# Patient Record
Sex: Female | Born: 1970 | Race: Black or African American | Hispanic: No | Marital: Single | Smoking: Never smoker
Health system: Southern US, Community
[De-identification: ages and names within clinical notes are randomized; demographics above are authoritative.]

## PROBLEM LIST (undated history)

## (undated) DIAGNOSIS — J45909 Unspecified asthma, uncomplicated: Secondary | ICD-10-CM

## (undated) DIAGNOSIS — I209 Angina pectoris, unspecified: Secondary | ICD-10-CM

## (undated) DIAGNOSIS — M069 Rheumatoid arthritis, unspecified: Secondary | ICD-10-CM

## (undated) DIAGNOSIS — D649 Anemia, unspecified: Secondary | ICD-10-CM

## (undated) DIAGNOSIS — C539 Malignant neoplasm of cervix uteri, unspecified: Secondary | ICD-10-CM

## (undated) DIAGNOSIS — G4730 Sleep apnea, unspecified: Secondary | ICD-10-CM

## (undated) DIAGNOSIS — Z95810 Presence of automatic (implantable) cardiac defibrillator: Secondary | ICD-10-CM

## (undated) DIAGNOSIS — D509 Iron deficiency anemia, unspecified: Secondary | ICD-10-CM

## (undated) DIAGNOSIS — G43909 Migraine, unspecified, not intractable, without status migrainosus: Secondary | ICD-10-CM

## (undated) DIAGNOSIS — R87629 Unspecified abnormal cytological findings in specimens from vagina: Secondary | ICD-10-CM

## (undated) DIAGNOSIS — Z9289 Personal history of other medical treatment: Secondary | ICD-10-CM

## (undated) DIAGNOSIS — E119 Type 2 diabetes mellitus without complications: Secondary | ICD-10-CM

## (undated) DIAGNOSIS — M797 Fibromyalgia: Secondary | ICD-10-CM

## (undated) DIAGNOSIS — I509 Heart failure, unspecified: Secondary | ICD-10-CM

## (undated) DIAGNOSIS — I639 Cerebral infarction, unspecified: Secondary | ICD-10-CM

## (undated) DIAGNOSIS — D573 Sickle-cell trait: Secondary | ICD-10-CM

## (undated) DIAGNOSIS — L930 Discoid lupus erythematosus: Secondary | ICD-10-CM

## (undated) DIAGNOSIS — J189 Pneumonia, unspecified organism: Secondary | ICD-10-CM

## (undated) DIAGNOSIS — M329 Systemic lupus erythematosus, unspecified: Secondary | ICD-10-CM

## (undated) DIAGNOSIS — I10 Essential (primary) hypertension: Secondary | ICD-10-CM

## (undated) HISTORY — PX: KNEE SURGERY: SHX244

## (undated) HISTORY — PX: EYE SURGERY: SHX253

## (undated) HISTORY — DX: Type 2 diabetes mellitus without complications: E11.9

## (undated) HISTORY — DX: Sleep apnea, unspecified: G47.30

## (undated) HISTORY — PX: INCISE AND DRAIN ABCESS: PRO64

## (undated) HISTORY — DX: Unspecified abnormal cytological findings in specimens from vagina: R87.629

## (undated) HISTORY — DX: Personal history of other medical treatment: Z92.89

---

## 1989-12-23 HISTORY — PX: DILATION AND CURETTAGE OF UTERUS: SHX78

## 1994-12-23 HISTORY — PX: TUBAL LIGATION: SHX77

## 1995-12-24 HISTORY — PX: KNEE ARTHROSCOPY: SHX127

## 1998-09-02 ENCOUNTER — Emergency Department (HOSPITAL_COMMUNITY): Admission: EM | Admit: 1998-09-02 | Discharge: 1998-09-02 | Payer: Self-pay | Admitting: Emergency Medicine

## 1998-09-13 ENCOUNTER — Ambulatory Visit (HOSPITAL_COMMUNITY): Admission: RE | Admit: 1998-09-13 | Discharge: 1998-09-13 | Payer: Self-pay | Admitting: Internal Medicine

## 1998-09-13 ENCOUNTER — Encounter: Payer: Self-pay | Admitting: Internal Medicine

## 1999-03-25 ENCOUNTER — Emergency Department (HOSPITAL_COMMUNITY): Admission: EM | Admit: 1999-03-25 | Discharge: 1999-03-25 | Payer: Self-pay | Admitting: Emergency Medicine

## 1999-03-25 ENCOUNTER — Encounter: Payer: Self-pay | Admitting: Emergency Medicine

## 1999-03-31 ENCOUNTER — Encounter: Payer: Self-pay | Admitting: Emergency Medicine

## 1999-03-31 ENCOUNTER — Emergency Department (HOSPITAL_COMMUNITY): Admission: EM | Admit: 1999-03-31 | Discharge: 1999-03-31 | Payer: Self-pay | Admitting: Emergency Medicine

## 1999-08-18 ENCOUNTER — Encounter: Payer: Self-pay | Admitting: Emergency Medicine

## 1999-08-18 ENCOUNTER — Emergency Department (HOSPITAL_COMMUNITY): Admission: EM | Admit: 1999-08-18 | Discharge: 1999-08-19 | Payer: Self-pay | Admitting: Emergency Medicine

## 2000-05-06 ENCOUNTER — Ambulatory Visit (HOSPITAL_BASED_OUTPATIENT_CLINIC_OR_DEPARTMENT_OTHER): Admission: RE | Admit: 2000-05-06 | Discharge: 2000-05-06 | Payer: Self-pay | Admitting: Orthopedic Surgery

## 2000-06-26 ENCOUNTER — Encounter: Admission: RE | Admit: 2000-06-26 | Discharge: 2000-06-26 | Payer: Self-pay | Admitting: Family Medicine

## 2000-11-21 ENCOUNTER — Encounter: Payer: Self-pay | Admitting: Internal Medicine

## 2000-11-21 ENCOUNTER — Encounter: Admission: RE | Admit: 2000-11-21 | Discharge: 2000-11-21 | Payer: Self-pay | Admitting: Internal Medicine

## 2000-12-06 ENCOUNTER — Emergency Department (HOSPITAL_COMMUNITY): Admission: EM | Admit: 2000-12-06 | Discharge: 2000-12-06 | Payer: Self-pay | Admitting: Emergency Medicine

## 2000-12-06 ENCOUNTER — Encounter: Payer: Self-pay | Admitting: Emergency Medicine

## 2002-07-11 ENCOUNTER — Emergency Department (HOSPITAL_COMMUNITY): Admission: EM | Admit: 2002-07-11 | Discharge: 2002-07-12 | Payer: Self-pay | Admitting: *Deleted

## 2004-06-11 ENCOUNTER — Encounter: Admission: RE | Admit: 2004-06-11 | Discharge: 2004-06-29 | Payer: Self-pay | Admitting: Internal Medicine

## 2005-01-08 ENCOUNTER — Inpatient Hospital Stay (HOSPITAL_COMMUNITY): Admission: AD | Admit: 2005-01-08 | Discharge: 2005-01-08 | Payer: Self-pay | Admitting: *Deleted

## 2005-01-10 ENCOUNTER — Ambulatory Visit: Payer: Self-pay | Admitting: Hematology & Oncology

## 2005-02-06 ENCOUNTER — Emergency Department (HOSPITAL_COMMUNITY): Admission: EM | Admit: 2005-02-06 | Discharge: 2005-02-06 | Payer: Self-pay | Admitting: Family Medicine

## 2005-02-27 ENCOUNTER — Ambulatory Visit: Payer: Self-pay | Admitting: Hematology & Oncology

## 2005-05-28 ENCOUNTER — Ambulatory Visit: Payer: Self-pay | Admitting: Hematology & Oncology

## 2005-09-03 ENCOUNTER — Ambulatory Visit: Payer: Self-pay | Admitting: Hematology & Oncology

## 2005-10-23 ENCOUNTER — Ambulatory Visit: Payer: Self-pay | Admitting: Hematology & Oncology

## 2005-12-10 ENCOUNTER — Ambulatory Visit: Payer: Self-pay | Admitting: Hematology & Oncology

## 2006-03-04 ENCOUNTER — Ambulatory Visit: Payer: Self-pay | Admitting: Hematology & Oncology

## 2006-06-27 ENCOUNTER — Emergency Department (HOSPITAL_COMMUNITY): Admission: EM | Admit: 2006-06-27 | Discharge: 2006-06-27 | Payer: Self-pay | Admitting: Family Medicine

## 2006-12-17 ENCOUNTER — Encounter: Admission: RE | Admit: 2006-12-17 | Discharge: 2006-12-17 | Payer: Self-pay | Admitting: Rheumatology

## 2007-03-27 ENCOUNTER — Emergency Department (HOSPITAL_COMMUNITY): Admission: EM | Admit: 2007-03-27 | Discharge: 2007-03-27 | Payer: Self-pay | Admitting: Emergency Medicine

## 2007-05-21 ENCOUNTER — Ambulatory Visit (HOSPITAL_COMMUNITY): Admission: RE | Admit: 2007-05-21 | Discharge: 2007-05-21 | Payer: Self-pay | Admitting: Obstetrics & Gynecology

## 2007-05-21 ENCOUNTER — Encounter (INDEPENDENT_AMBULATORY_CARE_PROVIDER_SITE_OTHER): Payer: Self-pay | Admitting: Obstetrics & Gynecology

## 2007-06-03 ENCOUNTER — Ambulatory Visit: Payer: Self-pay | Admitting: Hematology & Oncology

## 2007-06-03 LAB — CBC WITH DIFFERENTIAL/PLATELET
BASO%: 0.1 % (ref 0.0–2.0)
Basophils Absolute: 0 10*3/uL (ref 0.0–0.1)
EOS%: 1.2 % (ref 0.0–7.0)
Eosinophils Absolute: 0.1 10*3/uL (ref 0.0–0.5)
HCT: 24.7 % — ABNORMAL LOW (ref 34.8–46.6)
HGB: 7.8 g/dL — ABNORMAL LOW (ref 11.6–15.9)
LYMPH%: 24.7 % (ref 14.0–48.0)
MCH: 20 pg — ABNORMAL LOW (ref 26.0–34.0)
MCHC: 31.6 g/dL — ABNORMAL LOW (ref 32.0–36.0)
MCV: 63.3 fL — ABNORMAL LOW (ref 81.0–101.0)
MONO#: 0.5 10*3/uL (ref 0.1–0.9)
MONO%: 9.5 % (ref 0.0–13.0)
NEUT#: 3.2 10*3/uL (ref 1.5–6.5)
NEUT%: 64.5 % (ref 39.6–76.8)
Platelets: 195 10*3/uL (ref 145–400)
RBC: 3.91 10*6/uL (ref 3.70–5.32)
RDW: 20.4 % — ABNORMAL HIGH (ref 11.3–14.5)
WBC: 4.9 10*3/uL (ref 3.9–10.0)
lymph#: 1.2 10*3/uL (ref 0.9–3.3)

## 2007-06-10 LAB — TRANSFERRIN RECEPTOR, SOLUABLE: Transferrin Receptor, Soluble: 97.6

## 2007-06-10 LAB — FERRITIN: Ferritin: 3 ng/mL — ABNORMAL LOW (ref 10–291)

## 2007-07-27 ENCOUNTER — Ambulatory Visit: Payer: Self-pay | Admitting: Hematology & Oncology

## 2007-12-24 HISTORY — PX: ABDOMINAL WOUND DEHISCENCE: SHX540

## 2007-12-24 HISTORY — PX: ABDOMINAL HYSTERECTOMY: SHX81

## 2007-12-24 HISTORY — PX: HEMATOMA EVACUATION: SHX5118

## 2008-06-17 ENCOUNTER — Encounter: Admission: RE | Admit: 2008-06-17 | Discharge: 2008-06-17 | Payer: Self-pay | Admitting: Internal Medicine

## 2008-08-09 ENCOUNTER — Ambulatory Visit (HOSPITAL_COMMUNITY): Admission: RE | Admit: 2008-08-09 | Discharge: 2008-08-10 | Payer: Self-pay | Admitting: Obstetrics & Gynecology

## 2008-08-09 ENCOUNTER — Encounter (INDEPENDENT_AMBULATORY_CARE_PROVIDER_SITE_OTHER): Payer: Self-pay | Admitting: Obstetrics & Gynecology

## 2008-08-22 ENCOUNTER — Observation Stay (HOSPITAL_COMMUNITY): Admission: AD | Admit: 2008-08-22 | Discharge: 2008-08-23 | Payer: Self-pay | Admitting: Obstetrics and Gynecology

## 2008-08-25 ENCOUNTER — Inpatient Hospital Stay (HOSPITAL_COMMUNITY): Admission: AD | Admit: 2008-08-25 | Discharge: 2008-08-29 | Payer: Self-pay | Admitting: Obstetrics

## 2008-09-26 ENCOUNTER — Ambulatory Visit (HOSPITAL_COMMUNITY): Admission: RE | Admit: 2008-09-26 | Discharge: 2008-09-26 | Payer: Self-pay | Admitting: Obstetrics & Gynecology

## 2009-01-08 IMAGING — CT CT PELVIS W/ CM
4 of 7 series · 11 of 32 positions shown, 15 images · IV contrast ([ID] READICAT & [ID] OMNIP 300%)
Comparison: 08/25/2008

CLINICAL DATA: Follow up right ovarian vein thrombosis.  Postop
from hysterectomy approximates 6 weeks ago.

CT PELVIS WITH CONTRAST
TECHNIQUE: Multidetector CT imaging of the pelvis was performed
following the standard protocol during administration of
intravenous contrast.
Contrast: 100 ml Imnipaque-U33

[Series 2: abd pelvis · axial · 0.62mm/px · z∈[-466,-196]mm · 3 of 82 slices shown, 7 images]
[im 1/82  soft-tissue]
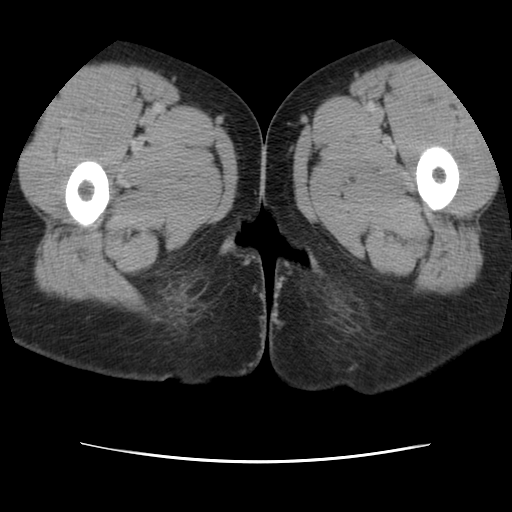
[im 1/82  lung]
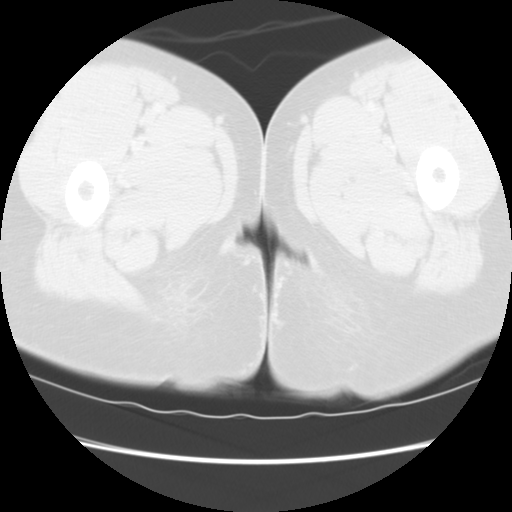
[im 1/82  bone]
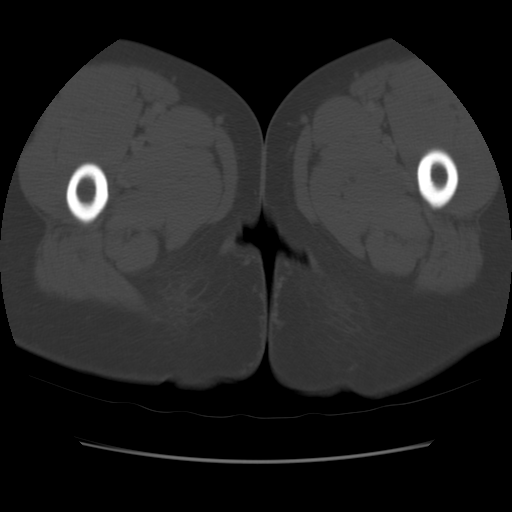
[im 41/82  soft-tissue]
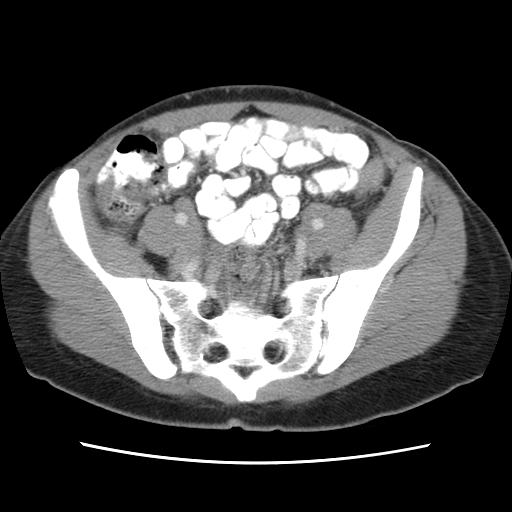
[im 41/82  lung]
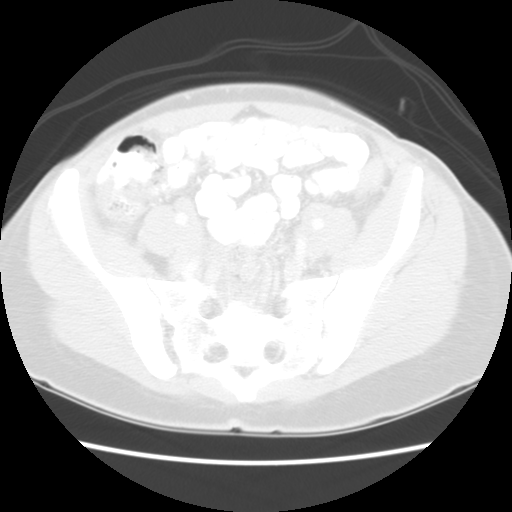
[im 82/82  soft-tissue]
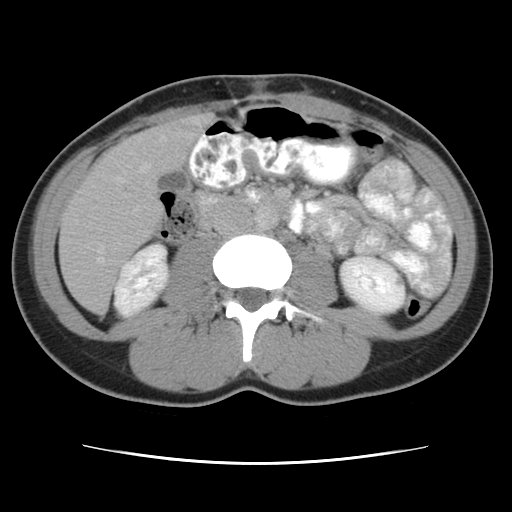
[im 82/82  lung]
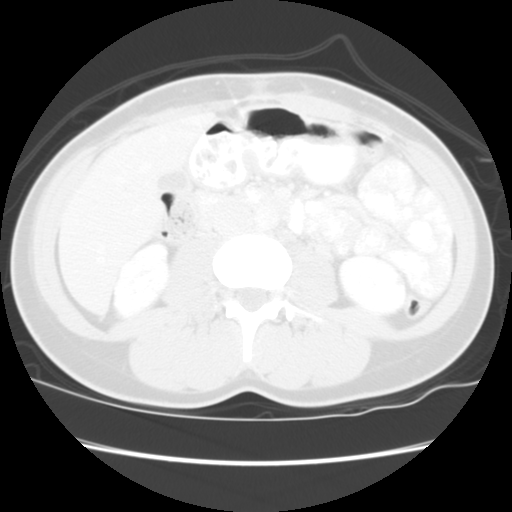

[Series 400: reformatted · coronal · 0.62mm/px · 2 of 110 slices shown (1 of 3)]
[im 37/110  soft-tissue]
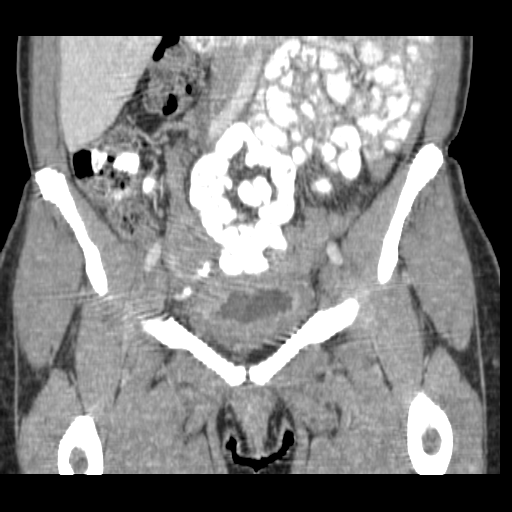
[im 73/110  soft-tissue]
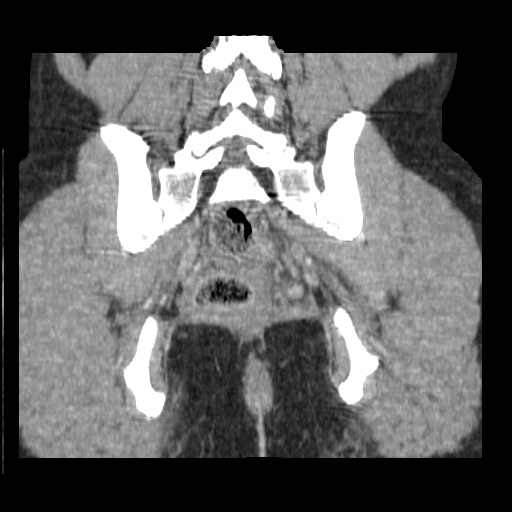

[Series 401: reformatted · sagittal · 0.62mm/px · 3 of 135 slices shown (2 of 3)]
[im 34/135  soft-tissue]
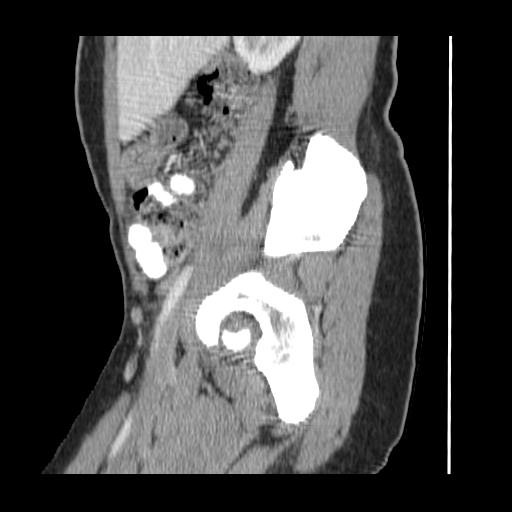
[im 68/135  soft-tissue]
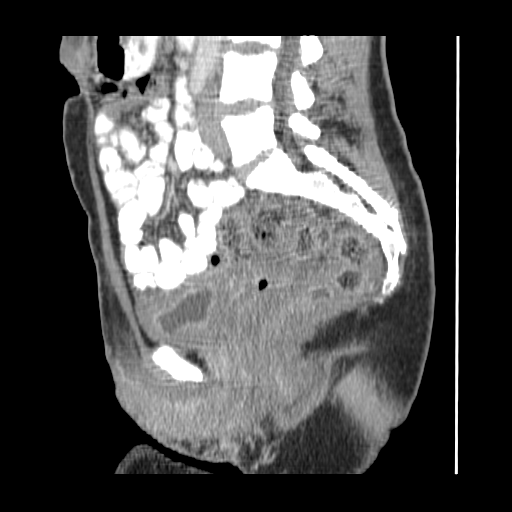
[im 101/135  soft-tissue]
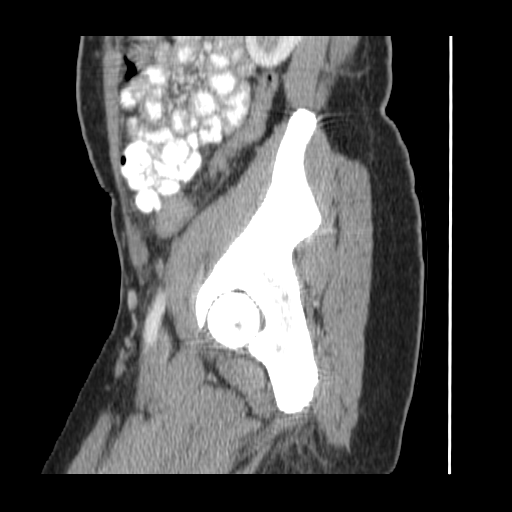

[Series 403: reformatted · sagittal · 0.62mm/px · 3 of 131 slices shown (3 of 3)]
[im 33/131  soft-tissue]
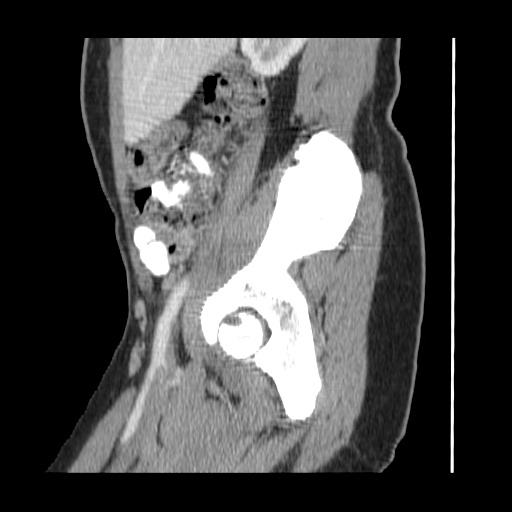
[im 66/131  soft-tissue]
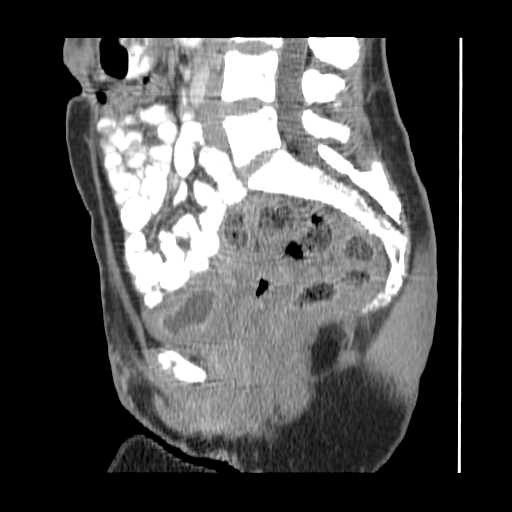
[im 98/131  soft-tissue]
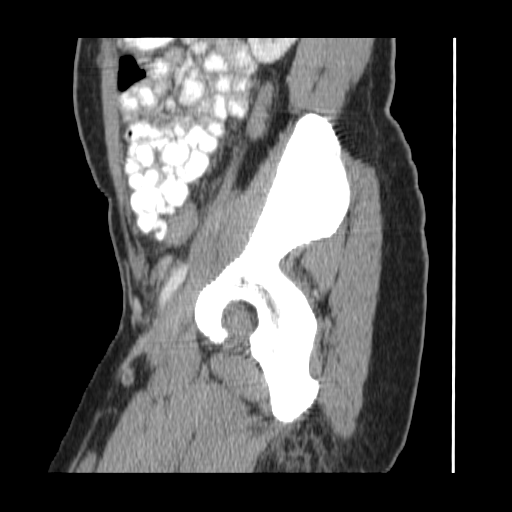

[11 of 32 positions shown; findings below may reference images not displayed]

FINDINGS: Previously seen right ovarian vein thrombosis has now
resolved.  This is no longer seen on today's exam.

The patient has undergone prior hysterectomy.  Previously seen
extraluminal fluid and gas collections in the pelvis have also
resolved.

A new intermediate attenuation lesion is seen in the right ovary on
today's study which measures 4.4 x 5.1 cm.  This appears new since
previous study and likely represents a hemorrhagic ovarian cyst.
The left ovary appears normal in size.  No other mass or
inflammatory process identified.  Pelvic bowel loops are normal in
appearance.
IMPRESSION: 1.  Resolution of right ovarian vein thrombosis since prior exam.
2.  Probable 4 x 5 cm hemorrhagic cyst in the right ovary, which is
new since prior study.  Consider follow-up with pelvic ultrasound
to confirm resolution.

## 2009-02-14 ENCOUNTER — Encounter: Admission: RE | Admit: 2009-02-14 | Discharge: 2009-02-14 | Payer: Self-pay | Admitting: Internal Medicine

## 2010-08-21 ENCOUNTER — Emergency Department (HOSPITAL_COMMUNITY): Admission: EM | Admit: 2010-08-21 | Discharge: 2010-08-21 | Payer: Self-pay | Admitting: Emergency Medicine

## 2010-12-29 ENCOUNTER — Emergency Department (HOSPITAL_COMMUNITY)
Admission: EM | Admit: 2010-12-29 | Discharge: 2010-12-29 | Payer: Self-pay | Source: Home / Self Care | Admitting: Emergency Medicine

## 2011-01-13 ENCOUNTER — Encounter: Payer: Self-pay | Admitting: Obstetrics & Gynecology

## 2011-05-07 NOTE — Op Note (Signed)
NAME:  Christine Mcdowell, Christine Mcdowell  ACCOUNT NO.:  [id number]   MEDICAL RECORD NO.:  [id number]          PATIENT TYPE:  AMB   LOCATION:  SDC                           FACILITY:  [facility]   PHYSICIAN:  Genia Del, M.D.DATE OF BIRTH:  08-Sep-1971   DATE OF PROCEDURE:  05/21/2007  DATE OF DISCHARGE:                               OPERATIVE REPORT   PREOPERATIVE DIAGNOSES:  1. Menorrhagia.  2. Left vulvar cyst.   POSTOPERATIVE DIAGNOSES:  1. Menorrhagia.  2. Left vulvar cyst.   PROCEDURES:  1. Endometrial ablation with NovaSure.  2. Excision of left small labial cyst.   SURGEON:  Genia Del, M.D.   ANESTHESIOLOGIST:  Raul Del, M.D.   PROCEDURE:  Under general anesthesia with endotracheal intubation, the  patient is in lithotomy position.  She is prepped with Betadine on the  suprapubic, vulvar and vaginal areas.  She is draped as usual.  The  vaginal exam under general anesthesia reveals an anteverted uterus,  mobile, no adnexal mass, cervix is long and closed.  No vaginal  bleeding.  We introduce the speculum in the vagina.  We grasp the  anterior lip of the cervix with a tenaculum.  The hysterometry is at 10  cm, the cervical length is 4.5  for a cavity length at 5.5.  We proceed  with dilatation with Hegar dilators up to #33 without difficulty.  We  then introduce the NovaSure instrument in the intrauterine cavity.  We  measure the width at 4.7 cm. the cavity integrity is verified and is  good.  We use a power of 142 fo4 1 minute 28 seconds.  The instrument is  then removed.  Hemostasis is adequate.  The speculum is removed after  removing the tenaculum.  We then verify the vulva.  A small cyst is  present on the superior aspect of the left small labia.  We use the  scalpel to make an incision on the inner side of the labia at that  level.  We rupture the cyst, which drains a white purulent material  which is somewhat viscous.  We then excise the cyst wall and  send it for  pathology.  We then put 2 deep stitches with Vicryl 4-0 to complete  hemostasis at the bed of the cyst.  We then close the skin with  interrupted Vicryl 4-0.  We then use compression to assure good  hemostasis for 3 minutes.  The hemostasis is adequate after that.  The  estimated blood loss was minimal, no complication occurred, and the  patient was brought to recovery room in good, stable status.      Genia Del, M.D.  Electronically Signed    ML/MEDQ  D:  05/21/2007  T:  05/21/2007  Job:  [id number]

## 2011-05-07 NOTE — Op Note (Signed)
NAME:  Christine Mcdowell, Christine Mcdowell          ACCOUNT NO.:  [id number]   MEDICAL RECORD NO.:  [id number]          PATIENT TYPE:  OBV   LOCATION:  9311                          FACILITY:  [facility]   PHYSICIAN:  Genia Del, M.D.DATE OF BIRTH:  12-10-1971   DATE OF PROCEDURE:  08/23/2008  DATE OF DISCHARGE:                               OPERATIVE REPORT   PREOPERATIVE DIAGNOSIS:  Persistent vaginal vault bleeding with  decreasing hemoglobin.   POSTOPERATIVE DIAGNOSES:  Right vaginal vault hematoma/abscess and right  vaginal vault bleeding.   PROCEDURE:  Hemostasis and closure of right vaginal vault, diagnostic  laparoscopy, drainage of right vaginal vault hematoma/abscess.   SURGEON:  Genia Del, MD   ASSISTANT:  None.   ANESTHESIOLOGIST:  Quillian Quince, MD   PROCEDURE:  Under general anesthesia with laryngeal mask, the patient  was in lithotomy position.  She was prepped with Betadine on the  abdominal, suprapubic, vulvar, and vaginal areas.  A Foley was put in  place in the bladder.  The patient was draped as usual.  We go vaginally  first.  The speculum was put in place,  The vaginal vault was inspected.  On the right side, we note bleeding at the vaginal mucosa with partial  dehiscence.  Necrosis is present deep at that location.  We used the  electrocautery to control hemostasis at the vaginal mucosa and then  figure-of-eights were done to close the vaginal mucosa with Vicryl 0.  Hemostasis was adequate at that level.  We therefore changed gloves and  gown and go abdominally.  We infiltrated the subcutaneous tissue with  Marcaine 0.25% plain at the supraumbilical incision.  We opened at the  same location as the first surgery.  The scalpel was used to make the  incision.  We then cut the stitch at the aponeurosis and opened bluntly  with a finger.  We then put a pursestring stitch at the aponeurosis with  Vicryl 0.  We insert the Hasson with a camera at that level.   Pneumoperitoneum was created with CO2.  We inspected the abdominopelvic  cavities.  No evidence of bleeding in the abdominopelvic cavities.  A  minimal amount of clear fluid was present at the vaginal vault.  We note  a bulging area towards the right vaginal vault and right pelvic wall.  This area was palpated with the probe and started draining.  A brownish  material was suctioned and it had a little bit of pus smell.  We  irrigated and suctioned abundantly within the hematoma or beginning of  abscess.  No active bleeding was present at that level.  The vaginal  vault was completely closed.  The CO2 does not evacuate at that level.  We verify the ureters which are peristalsing on both sides.  The bladder  was intact with clear urine.  Fine adhesions were present in the pelvis,  but no other pathology was seen.  We therefore removed all instruments.  We removed the trocars under direct vision after evacuating the CO2 as  much as possible.  We then closed the pursestring stitch at the  supraumbilical incision.  We closed the skin with a subcuticular stitch  of Monocryl 4-0 at that level.  Note that to complete the procedure a 5-  mm trocar was inserted at the site where the left robotic arm was after  infiltrating the subcutaneous tissue with Marcaine 0.25% plain.  At the  end, this incision was also closed with Monocryl 4-0 and Dermabond was  applied on those 2 incisions.  Hemostasis was adequate at those levels.  We then go back vaginally verify hemostasis with a sponge on a stick.  Absolutely no bleeding was present.  The count of instruments and  sponges was complete.  The estimated blood loss was minimal, but about  200 mL of blood clots were evacuated from the vagina at the beginning of  the  intervention.  The patient has received Ancef 1 g IV at induction.  Note  that Flagyl 500 q.8 h. will be given for 7 days because of the  probability of a vaginal vault abscess in development.   The patient was  brought to recovery room in good stable status.      Genia Del, M.D.  Electronically Signed     ML/MEDQ  D:  08/23/2008  T:  08/23/2008  Job:  [id number]

## 2011-05-07 NOTE — Op Note (Signed)
NAME:  Christine Mcdowell, Christine Mcdowell NO.:  [id number]   MEDICAL RECORD NO.:  [id number]          PATIENT TYPE:  AMB   LOCATION:  DAY                          FACILITY:  [facility]   PHYSICIAN:  Genia Del, M.D.DATE OF BIRTH:  1971/05/12   DATE OF PROCEDURE:  08/09/2008  DATE OF DISCHARGE:                               OPERATIVE REPORT   PREOPERATIVE DIAGNOSIS:  Refractory menorrhagia and pelvic pain.   POSTOPERATIVE DIAGNOSIS:  Refractory menorrhagia and pelvic pain.   PROCEDURE:  Total laparoscopy hysterectomy assisted with [employer] robot.   SURGEON:  Dr. Genia Del.   ASSISTANT:  Dr. Leda Quail.   PROCEDURE:  Under general anesthesia with endotracheal intubation, the  patient is in the lithotomy position.  She is prepped with Betadine on  the abdominal, suprapubic, vulvar and vaginal areas and draped as usual.  A Foley catheter is put in place in the bladder.  We proceeded with a  vaginal exam under general anesthesia revealing an anteverted uterus  about 9 to 10 cm in diameter.  No adnexal mass.  The patient has had  prior endometrial ablation that makes the insertion of the [facility] ring more  difficult with the RUMI, but it is successfully put in place as usual.  We then went to abdominal time.  We made a supraumbilical incision with  the scalpel over 1.5 cm after an infiltration of the Marcaine one-  quarter plain.  We opened the aponeurosis with Mayo scissors under  direct vision and the parietal peritoneum bluntly with a finger.  A  pursestring stitch of Vicryl zero is put at the aponeurosis.  We  inserted the [facility] and the laparoscope under direct vision.  We created  a pneumoperitoneum with CO2.  We then put the other trocar placement  organized in a circular configuration.  We have a right robotic port, a  left robotic port and a third arm on the left; we have the assistant  port on the right.  Infiltration of the subcutaneous tissue with  Marcaine is  done each time the scalpel is used for the incisions and the  trocars are inserted under direct vision.  We then docked the robot on  the left side of the patient without difficulty.  We inserted the  instruments; the Endo shear scissor on the right arm, fenestrated  bipolar on the left arm and the Cobra clamp on the third arm.  We  started at the console.  The abdominal cavity is normal.  The pelvic  cavity presents filmy adhesions in the posterior cul-de-sac and towards  the right adnexa involving the right ovary with the pelvic wall.  We  visualized both ureters in normal anatomic position.  We started on the  left side.  We cauterized and sectioned the left round ligament,  the  left tube at the site where a previous tubal sterilization was done and  the left utero-ovarian ligament.  We continued down the left side of the  uterus.  We opened the anterior peritoneum and reclined the bladder  downward.  We proceeded exactly the same way on the right side and then  reclined the  bladder further past the [facility] ring.  We then cauterized and  sectioned the uterine arteries.  We started with the fenestrated bipolar  plate feeling that it was slipping at the tip of the clamp.  The  decision was made to switch for the PK and finish cauterization and  section of the uterine artery.  We were then ready to open the superior  aspect of the vagina around the [place] ring, but the [place] ring got undone.  We therefore went vaginally to reposition the Koh ring with the RUMI,  which was successful leaving a tenaculum on the cervix to make it more  stable.  The reason why the [facility] ring was difficult to put in place and  did not remain in place was that the intrauterine cavity was very small  secondary to the previous endometrial ablation.  We were then successful  doing the colpotomy with the tip of the Endo shear scissors and the PK.  The uterus and cervix was were completely detached from the vaginal  vault and  successfully passed vaginally.  The specimen was sent to  Pathology.  We then inflated the occluder vaginally and changed  instruments.  A cutting needle driver in the right hand, a regular  needle driver on the left hand, and the PK clamp on the third arm.  We  used Vicryl zero stitch to close the vagina in figure-of-eights.  We  started on the right angle and then the left angle and finished on the  midline.  Hemostasis was verified and adequate at all levels.  Irrigation and suction was done.  We then removed all instruments under  direct vision, undocked the robot as usual and removed the trocars under  direct vision.  The CO2 was evacuated.  We closed the supraumbilical  incision with the attachment of the pursestring stitch first.  We then  used Vicryl 4-0 in a subcuticular stitch at the supraumbilical incision  and at the assistant port incision and then used Dermabond on all  incisions.  The occluder was removed vaginally.  The estimated blood  loss was 66 mL.  The count of instruments and sponges was complete.  The  patient was brought to the recovery room in good stable status.  No  complications occurred.      Genia Del, M.D.  Electronically Signed     ML/MEDQ  D:  08/09/2008  T:  08/09/2008  Job:  [id number]

## 2011-05-07 NOTE — Op Note (Signed)
NAME:  Christine Mcdowell, Christine Mcdowell NO.:  [id number]   MEDICAL RECORD NO.:  [id number]          PATIENT TYPE:  OBV   LOCATION:  9311                          FACILITY:  [facility]   PHYSICIAN:  Genia Del, M.D.DATE OF BIRTH:  10/12/1971   DATE OF PROCEDURE:  08/22/2008  DATE OF DISCHARGE:                               OPERATIVE REPORT   PREOPERATIVE DIAGNOSIS:  Postoperative vaginal bleeding after total  laparoscopic hysterectomy [employer] assisted on August 09, 2008.   POSTOPERATIVE DIAGNOSIS:  Postoperative vaginal bleeding after total  laparoscopic hysterectomy [employer] assisted on August 09, 2008.  No  evidence of vaginal cuff dehiscence.   INTERVENTION:  Vaginal cuff revision and repair.   SURGEON:  Genia Del, MD   ASSISTANT:  None.   ANESTHESIOLOGIST:  Quillian Quince, MD   PROCEDURE:  Under general anesthesia with laryngeal mask, the patient  was in lithotomy position.  She was prepped with Betadine on the  suprapubic, vulvar, and vaginal areas and draped as usual.  We  catheterized the bladder.  We then inserted the weighted speculum in the  vagina.  The 2 sponges put in place at [facility] office were  removed before doing the prep and they had mild amount of blood on them.  Retractors were used anteriorly and on the left lateral side.  No active  bleeding was present.  No evidence of vaginal cuff dehiscence, although  on the left side after touching with the sponge, a small amount of  bleeding was present.  The decision was therefore made to proceed with  cuff repair on the left side to have more pressure on any possible  venous bleeder.  We used Vicryl 0 on a CT-1 and made figure-of-eights.  Four figure-of-eights were done which completed hemostasis very well.  No evidence of any bleeding or lesion otherwise in the vagina.  A  hemoglobin was done in the office earlier this afternoon and was at 10,  although hemoglobin just prior to surgery was at  9.8, and the patient  was hemodynamically stable before surgery and during the all course of  surgery.  We therefore decided to stop the surgery at this point.  The  estimated blood loss was minimal.  No complications occurred and the  patient was brought to recovery room in good stable status.  The count  of instruments and sponges was complete.  The patient received a dose of  Ancef 1 g IV at induction.      Genia Del, M.D.  Electronically Signed     ML/MEDQ  D:  08/22/2008  T:  08/23/2008  Job:  [id number]

## 2011-05-10 NOTE — Discharge Summary (Signed)
NAME:  Christine Mcdowell, Christine Mcdowell NO.:  [id number]   MEDICAL RECORD NO.:  [id number]          PATIENT TYPE:  OBV   LOCATION:  9311                          FACILITY:  [facility]   PHYSICIAN:  Genia Del, M.D.DATE OF BIRTH:  07/25/1971   DATE OF ADMISSION:  08/22/2008  DATE OF DISCHARGE:  08/23/2008                               DISCHARGE SUMMARY   ADMISSION DIAGNOSIS:  Postop vaginal bleeding with decreasing  hemoglobin.   DISCHARGE DIAGNOSIS:  Right vaginal vault hematoma or abscess with right  vaginal vault bleeding with partial dehiscence.   Procedures, first one done August 22, 2008 at 7:20 p.m.  Preop diagnosis  was postop vaginal bleeding after a total laparoscopic hysterectomy  assisted with the [employer] robot on August 09, 2008.  Postop diagnosis  was the same.  The procedure was reinforcements of the left vaginal cuff  with figure-of-eight with 0 Vicryl.  After that procedure, the patient  had recurrent vaginal bleeding with decreasing hemoglobin.  She was  therefore brought back to the OR at 1:30 a.m. on August 23, 2008.  The  postop diagnosis was right vaginal wall hematoma or abscess with right  vaginal vault bleeding and partial dehiscence.  The procedure was  hemostasis vaginally with closure of the right vaginal vault and a  diagnostic laparoscopy with drainage of the right vaginal wall hematoma  or abscess.  Evacuation of 200 mL of blood clots was performed in the  vagina at the beginning of the surgery.  Blood loss was otherwise  minimal.  The patient was given Ancef 1 g IV at induction.  The postop  evolution was then good.  She had severe anemia at 7.1, but she was  stable hemodynamically with no fever.  The decision was therefore taken  to discharge her home.  Flagyl p.o. was prescribed and she was to follow  up on August 26, 2008.  Advice were given to present for any vaginal  bleeding, increasing pain, or fever.      Genia Del, M.D.  Electronically Signed     ML/MEDQ  D:  10/06/2008  T:  10/07/2008  Job:  [id number]

## 2011-05-10 NOTE — Discharge Summary (Signed)
Christine Mcdowell, LAPE NO.:  [id number]   MEDICAL RECORD NO.:  [id number]          PATIENT TYPE:  INP   LOCATION:  9307                          FACILITY:  [facility]   PHYSICIAN:  Genia Del, M.D.DATE OF BIRTH:  10/12/1971   DATE OF ADMISSION:  08/25/2008  DATE OF DISCHARGE:  08/29/2008                               DISCHARGE SUMMARY   ADMISSION DIAGNOSES:  1. Postoperative abdominal pain and vomiting.  2. Vaginal vault abscess, drained surgically on August 23, 2008,      antibiotics p.o. not tolerated.   DISCHARGE DIAGNOSES:  1. Postoperative abdominal pain and vomiting.  2. Vaginal vault abscess, drained surgically on August 23, 2008,      antibiotics p.o. not tolerated.  3. Right ovarian vein thrombosis.  4. Chronic hypertension, difficult to control.   HOSPITAL COURSE:  The patient had CT scan of the abdomen and pelvis  showing multiple fluid collections in the pelvis and a large right  ovarian vein thrombosis.  She was treated with Unasyn IV and then  Augmentin p.o. was started.  She was also started on Lovenox after  consulting with Hematology.  Her blood pressures were poorly controlled  during the hospitalization, therefore she was started on labetalol and  lisinopril/hydrochlorothiazide.  She was discharged home on  hospitalization day #5.  Advice were given.  Treatment was continued  with Lovenox, Augmentin, and labetalol as well as  lisinopril/hydrochlorothiazide.  She was to follow up with me within a  week.  CT scan of the pelvis will be repeated to follow on the right  vein thrombosis and the pelvic fluid collection.      Genia Del, M.D.  Electronically Signed     ML/MEDQ  D:  10/06/2008  T:  10/07/2008  Job:  [id number]
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
## 2011-07-10 ENCOUNTER — Ambulatory Visit
Admission: RE | Admit: 2011-07-10 | Discharge: 2011-07-10 | Disposition: A | Discharge status: Home / Self Care | Payer: 59 | Source: Ambulatory Visit | Attending: Occupational Medicine | Admitting: Occupational Medicine

## 2011-07-10 ENCOUNTER — Other Ambulatory Visit: Payer: Self-pay | Admitting: Occupational Medicine

## 2011-07-10 DIAGNOSIS — R52 Pain, unspecified: Secondary | ICD-10-CM

## 2011-08-22 ENCOUNTER — Emergency Department (HOSPITAL_COMMUNITY): Payer: 59

## 2011-08-22 ENCOUNTER — Emergency Department (HOSPITAL_COMMUNITY)
Admission: EM | Admit: 2011-08-22 | Discharge: 2011-08-22 | Disposition: A | Discharge status: Home / Self Care | Payer: 59 | Attending: Emergency Medicine | Admitting: Emergency Medicine

## 2011-08-22 DIAGNOSIS — R4789 Other speech disturbances: Secondary | ICD-10-CM | POA: Insufficient documentation

## 2011-08-22 DIAGNOSIS — M2550 Pain in unspecified joint: Secondary | ICD-10-CM | POA: Insufficient documentation

## 2011-08-22 DIAGNOSIS — R51 Headache: Secondary | ICD-10-CM | POA: Insufficient documentation

## 2011-08-22 DIAGNOSIS — R5381 Other malaise: Secondary | ICD-10-CM | POA: Insufficient documentation

## 2011-08-22 DIAGNOSIS — R42 Dizziness and giddiness: Secondary | ICD-10-CM | POA: Insufficient documentation

## 2011-08-22 DIAGNOSIS — R262 Difficulty in walking, not elsewhere classified: Secondary | ICD-10-CM | POA: Insufficient documentation

## 2011-08-22 DIAGNOSIS — R5383 Other fatigue: Secondary | ICD-10-CM | POA: Insufficient documentation

## 2011-08-22 DIAGNOSIS — I10 Essential (primary) hypertension: Secondary | ICD-10-CM | POA: Insufficient documentation

## 2011-08-22 DIAGNOSIS — Z79899 Other long term (current) drug therapy: Secondary | ICD-10-CM | POA: Insufficient documentation

## 2011-08-22 LAB — CBC
HCT: 36.3 % (ref 36.0–46.0)
Hemoglobin: 12.1 g/dL (ref 12.0–15.0)
MCH: 26.9 pg (ref 26.0–34.0)
MCHC: 33.3 g/dL (ref 30.0–36.0)
MCV: 80.8 fL (ref 78.0–100.0)
Platelets: 185 10*3/uL (ref 150–400)
RBC: 4.49 MIL/uL (ref 3.87–5.11)
RDW: 13.3 % (ref 11.5–15.5)
WBC: 4.1 10*3/uL (ref 4.0–10.5)

## 2011-08-22 LAB — BASIC METABOLIC PANEL
BUN: 11 mg/dL (ref 6–23)
CO2: 32 mEq/L (ref 19–32)
Calcium: 9.8 mg/dL (ref 8.4–10.5)
Chloride: 103 mEq/L (ref 96–112)
Creatinine, Ser: 0.72 mg/dL (ref 0.50–1.10)
GFR calc Af Amer: >60 mL/min (ref >60)
GFR calc non Af Amer: >60 mL/min (ref >60)
Glucose, Bld: 97 mg/dL (ref 70–99)
Potassium: 3.6 mEq/L (ref 3.5–5.1)
Sodium: 142 mEq/L (ref 135–145)

## 2011-08-22 LAB — URINALYSIS, ROUTINE W REFLEX MICROSCOPIC
Bilirubin Urine: NEGATIVE
Glucose, UA: NEGATIVE mg/dL
Hgb urine dipstick: NEGATIVE
Ketones, ur: NEGATIVE mg/dL
Leukocytes, UA: NEGATIVE
Nitrite: POSITIVE — AB
Protein, ur: NEGATIVE mg/dL
Specific Gravity, Urine: 1.013 (ref 1.005–1.030)
Urobilinogen, UA: 1 mg/dL (ref 0.0–1.0)
pH: 7 (ref 5.0–8.0)

## 2011-08-22 LAB — URINE MICROSCOPIC-ADD ON

## 2011-08-22 LAB — GLUCOSE, CAPILLARY: Glucose-Capillary: 127 mg/dL — ABNORMAL HIGH (ref 70–99)

## 2011-08-22 LAB — POCT PREGNANCY, URINE: Preg Test, Ur: NEGATIVE

## 2011-08-22 LAB — DIFFERENTIAL
Basophils Absolute: 0 10*3/uL (ref 0.0–0.1)
Basophils Relative: 0 % (ref 0–1)
Eosinophils Absolute: 0 10*3/uL (ref 0.0–0.7)
Eosinophils Relative: 1 % (ref 0–5)
Lymphocytes Relative: 24 % (ref 12–46)
Lymphs Abs: 1 10*3/uL (ref 0.7–4.0)
Monocytes Absolute: 0.4 10*3/uL (ref 0.1–1.0)
Monocytes Relative: 10 % (ref 3–12)
Neutro Abs: 2.7 10*3/uL (ref 1.7–7.7)
Neutrophils Relative %: 66 % (ref 43–77)

## 2011-09-20 LAB — BASIC METABOLIC PANEL
BUN: 5 — ABNORMAL LOW
CO2: 30
Calcium: 8.8
Chloride: 105
Creatinine, Ser: 0.63
GFR calc Af Amer: >60
GFR calc non Af Amer: >60
Glucose, Bld: 78
Potassium: 3.6
Sodium: 141

## 2011-09-20 LAB — CBC
HCT: 36.1
Hemoglobin: 11.7 — ABNORMAL LOW
MCHC: 32.3
MCV: 84.9
Platelets: 184
RBC: 4.25
RDW: 14.3
WBC: 4.4

## 2011-09-20 LAB — PREGNANCY, URINE: Preg Test, Ur: NEGATIVE

## 2011-09-25 LAB — CBC
HCT: 19.5 — ABNORMAL LOW
HCT: 21.4 — ABNORMAL LOW
HCT: 21.6 — ABNORMAL LOW
HCT: 21.9 — ABNORMAL LOW
Hemoglobin: 6.5 — CL
Hemoglobin: 7 — CL
Hemoglobin: 7.1 — CL
Hemoglobin: 7.4 — CL
MCHC: 32.7
MCHC: 32.9
MCHC: 33.4
MCHC: 34
MCV: 84.6
MCV: 84.7
MCV: 85.5
MCV: 85.8
Platelets: 189
Platelets: 197
Platelets: 200
Platelets: 227
RBC: 2.3 — ABNORMAL LOW
RBC: 2.5 — ABNORMAL LOW
RBC: 2.55 — ABNORMAL LOW
RBC: 2.56 — ABNORMAL LOW
RDW: 13.4
RDW: 13.4
RDW: 13.4
RDW: 14.2
WBC: 10.1
WBC: 12.7 — ABNORMAL HIGH
WBC: 8.9
WBC: 9.4

## 2011-09-25 LAB — DIFFERENTIAL
Basophils Absolute: 0
Basophils Absolute: 0
Basophils Relative: 0
Basophils Relative: 0
Eosinophils Absolute: 0.2
Eosinophils Absolute: 0.2
Eosinophils Relative: 2
Eosinophils Relative: 3
Lymphocytes Relative: 10 — ABNORMAL LOW
Lymphocytes Relative: 6 — ABNORMAL LOW
Lymphs Abs: 0.6 — ABNORMAL LOW
Lymphs Abs: 0.9
Monocytes Absolute: 0.6
Monocytes Absolute: 0.6
Monocytes Relative: 7
Monocytes Relative: 7
Neutro Abs: 7.1
Neutro Abs: 8 — ABNORMAL HIGH
Neutrophils Relative %: 81 — ABNORMAL HIGH
Neutrophils Relative %: 85 — ABNORMAL HIGH

## 2011-09-25 LAB — CROSSMATCH
ABO/RH(D): A POS
Antibody Screen: NEGATIVE

## 2011-09-25 LAB — COMPREHENSIVE METABOLIC PANEL
ALT: 33
ALT: 8
ALT: 8
ALT: 9
AST: 12
AST: 13
AST: 14
AST: 55 — ABNORMAL HIGH
Albumin: 2.3 — ABNORMAL LOW
Albumin: 2.5 — ABNORMAL LOW
Albumin: 2.5 — ABNORMAL LOW
Albumin: 2.7 — ABNORMAL LOW
Alkaline Phosphatase: 37 — ABNORMAL LOW
Alkaline Phosphatase: 38 — ABNORMAL LOW
Alkaline Phosphatase: 40
Alkaline Phosphatase: 41
BUN: 3 — ABNORMAL LOW
BUN: <1 — ABNORMAL LOW
BUN: <1 — ABNORMAL LOW
BUN: <1 — ABNORMAL LOW
CO2: 27
CO2: 27
CO2: 29
CO2: 30
Calcium: 7.6 — ABNORMAL LOW
Calcium: 7.6 — ABNORMAL LOW
Calcium: 8.3 — ABNORMAL LOW
Calcium: 8.4
Chloride: 103
Chloride: 105
Chloride: 106
Chloride: 107
Creatinine, Ser: 0.59
Creatinine, Ser: 0.59
Creatinine, Ser: 0.59
Creatinine, Ser: 0.61
GFR calc Af Amer: >60
GFR calc Af Amer: >60
GFR calc Af Amer: >60
GFR calc Af Amer: >60
GFR calc non Af Amer: >60
GFR calc non Af Amer: >60
GFR calc non Af Amer: >60
GFR calc non Af Amer: >60
Glucose, Bld: 101 — ABNORMAL HIGH
Glucose, Bld: 112 — ABNORMAL HIGH
Glucose, Bld: 133 — ABNORMAL HIGH
Glucose, Bld: 96
Potassium: 2.7 — CL
Potassium: 2.9 — ABNORMAL LOW
Potassium: 3.2 — ABNORMAL LOW
Potassium: 3.4 — ABNORMAL LOW
Sodium: 139
Sodium: 139
Sodium: 140
Sodium: 141
Total Bilirubin: 0.4
Total Bilirubin: 0.4
Total Bilirubin: 0.4
Total Bilirubin: 0.4
Total Protein: 5.1 — ABNORMAL LOW
Total Protein: 5.4 — ABNORMAL LOW
Total Protein: 5.5 — ABNORMAL LOW
Total Protein: 5.5 — ABNORMAL LOW

## 2011-09-25 LAB — PROTEIN C ACTIVITY: Protein C Activity: 90 % (ref 75–133)

## 2011-09-25 LAB — LUPUS ANTICOAGULANT PANEL
DRVVT: 54.7 — ABNORMAL HIGH (ref 36.1–47.0)
Lupus Anticoagulant: NOT DETECTED
PTT Lupus Anticoagulant: 49.1 — ABNORMAL HIGH (ref 36.3–48.8)
PTTLA 4:1 Mix: 47.3 (ref 36.3–48.8)
dRVVT Incubated 1:1 Mix: 42.2 (ref 36.1–47.0)

## 2011-09-25 LAB — PREPARE RBC (CROSSMATCH)

## 2011-09-25 LAB — MAGNESIUM: Magnesium: 1.5

## 2011-09-25 LAB — BASIC METABOLIC PANEL
BUN: 1 — ABNORMAL LOW
CO2: 29
Calcium: 7.8 — ABNORMAL LOW
Chloride: 105
Creatinine, Ser: 0.64
GFR calc Af Amer: >60
GFR calc non Af Amer: >60
Glucose, Bld: 108 — ABNORMAL HIGH
Potassium: 3.1 — ABNORMAL LOW
Sodium: 138

## 2011-09-25 LAB — HEMOGLOBIN AND HEMATOCRIT, BLOOD
HCT: 24.2 — ABNORMAL LOW
HCT: 28.1 — ABNORMAL LOW
Hemoglobin: 8.1 — ABNORMAL LOW
Hemoglobin: 9.4 — ABNORMAL LOW

## 2011-09-25 LAB — PROTEIN C, TOTAL: Protein C, Total: 68 % — ABNORMAL LOW (ref 70–140)

## 2011-09-25 LAB — PROTEIN S, TOTAL: Protein S Ag, Total: 69 % — ABNORMAL LOW (ref 70–140)

## 2011-09-25 LAB — ANTITHROMBIN III: AntiThromb III Func: 76

## 2011-09-25 LAB — FACTOR 5 LEIDEN

## 2011-09-25 LAB — MISCELLANEOUS TEST

## 2011-09-25 LAB — POTASSIUM: Potassium: 3.4 — ABNORMAL LOW

## 2011-09-25 LAB — PROTEIN S ACTIVITY: Protein S Activity: 41 % — ABNORMAL LOW (ref 69–129)

## 2012-07-12 ENCOUNTER — Encounter (HOSPITAL_COMMUNITY): Payer: Self-pay | Admitting: *Deleted

## 2012-07-12 ENCOUNTER — Emergency Department (HOSPITAL_COMMUNITY)
Admission: EM | Admit: 2012-07-12 | Discharge: 2012-07-13 | Disposition: A | Discharge status: Home / Self Care | Payer: 59 | Attending: Emergency Medicine | Admitting: Emergency Medicine

## 2012-07-12 DIAGNOSIS — R52 Pain, unspecified: Secondary | ICD-10-CM | POA: Insufficient documentation

## 2012-07-12 DIAGNOSIS — R109 Unspecified abdominal pain: Secondary | ICD-10-CM | POA: Insufficient documentation

## 2012-07-12 DIAGNOSIS — M545 Low back pain, unspecified: Secondary | ICD-10-CM | POA: Insufficient documentation

## 2012-07-12 DIAGNOSIS — Z79899 Other long term (current) drug therapy: Secondary | ICD-10-CM | POA: Insufficient documentation

## 2012-07-12 DIAGNOSIS — M329 Systemic lupus erythematosus, unspecified: Secondary | ICD-10-CM | POA: Insufficient documentation

## 2012-07-12 HISTORY — DX: Systemic lupus erythematosus, unspecified: M32.9

## 2012-07-12 LAB — CBC WITH DIFFERENTIAL/PLATELET
Basophils Absolute: 0 10*3/uL (ref 0.0–0.1)
Basophils Relative: 0 % (ref 0–1)
Eosinophils Absolute: 0 10*3/uL (ref 0.0–0.7)
Eosinophils Relative: 1 % (ref 0–5)
HCT: 32.9 % — ABNORMAL LOW (ref 36.0–46.0)
Hemoglobin: 11 g/dL — ABNORMAL LOW (ref 12.0–15.0)
Lymphocytes Relative: 10 % — ABNORMAL LOW (ref 12–46)
Lymphs Abs: 0.6 10*3/uL — ABNORMAL LOW (ref 0.7–4.0)
MCH: 28.3 pg (ref 26.0–34.0)
MCHC: 33.4 g/dL (ref 30.0–36.0)
MCV: 84.6 fL (ref 78.0–100.0)
Monocytes Absolute: 0.4 10*3/uL (ref 0.1–1.0)
Monocytes Relative: 7 % (ref 3–12)
Neutro Abs: 4.7 10*3/uL (ref 1.7–7.7)
Neutrophils Relative %: 82 % — ABNORMAL HIGH (ref 43–77)
Platelets: 157 10*3/uL (ref 150–400)
RBC: 3.89 MIL/uL (ref 3.87–5.11)
RDW: 12.9 % (ref 11.5–15.5)
WBC: 5.7 10*3/uL (ref 4.0–10.5)

## 2012-07-12 LAB — BASIC METABOLIC PANEL
BUN: 12 mg/dL (ref 6–23)
CO2: 27 mEq/L (ref 19–32)
Calcium: 8.6 mg/dL (ref 8.4–10.5)
Chloride: 104 mEq/L (ref 96–112)
Creatinine, Ser: 0.81 mg/dL (ref 0.50–1.10)
GFR calc Af Amer: >90 mL/min (ref >90)
GFR calc non Af Amer: 88 mL/min — ABNORMAL LOW (ref >90)
Glucose, Bld: 86 mg/dL (ref 70–99)
Potassium: 3.7 mEq/L (ref 3.5–5.1)
Sodium: 139 mEq/L (ref 135–145)

## 2012-07-12 LAB — POCT I-STAT, CHEM 8
BUN: 13 mg/dL (ref 6–23)
Calcium, Ion: 1.23 mmol/L (ref 1.12–1.23)
Chloride: 104 mEq/L (ref 96–112)
Creatinine, Ser: 0.9 mg/dL (ref 0.50–1.10)
Glucose, Bld: 83 mg/dL (ref 70–99)
HCT: 34 % — ABNORMAL LOW (ref 36.0–46.0)
Hemoglobin: 11.6 g/dL — ABNORMAL LOW (ref 12.0–15.0)
Potassium: 3.6 mEq/L (ref 3.5–5.1)
Sodium: 143 mEq/L (ref 135–145)
TCO2: 27 mmol/L (ref 0–100)

## 2012-07-12 LAB — URINALYSIS, ROUTINE W REFLEX MICROSCOPIC
Bilirubin Urine: NEGATIVE
Glucose, UA: NEGATIVE mg/dL
Hgb urine dipstick: NEGATIVE
Ketones, ur: NEGATIVE mg/dL
Leukocytes, UA: NEGATIVE
Nitrite: NEGATIVE
Protein, ur: NEGATIVE mg/dL
Specific Gravity, Urine: 1.03 (ref 1.005–1.030)
Urobilinogen, UA: 1 mg/dL (ref 0.0–1.0)
pH: 7 (ref 5.0–8.0)

## 2012-07-12 LAB — PREGNANCY, URINE: Preg Test, Ur: NEGATIVE

## 2012-07-12 MED ORDER — KETOROLAC TROMETHAMINE 30 MG/ML IJ SOLN
30.0000 mg | Freq: Once | INTRAMUSCULAR | Status: AC
  Administered 2012-07-12: 30 mg via INTRAVENOUS
  Filled 2012-07-12: qty 1

## 2012-07-12 NOTE — ED Notes (Signed)
Patient is alert and oriented x3.  She is complaining of lower back pain with generalized pain. The patient is states that she feels like she is having a lupus flare.  Currently she rates her  Pain at 8 of 10.
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
## 2012-07-12 NOTE — ED Notes (Signed)
Pt c/o lower sharp back pain since 3a.  Pt has a hx of Lupus.   Pt also c/o general flare s/s of lupus, rash, general aches, and sweating.  When she woke up this morning her hands and feet were severely swollen, but no current swelling.

## 2012-07-13 LAB — GLUCOSE, CAPILLARY: Glucose-Capillary: 134 mg/dL — ABNORMAL HIGH (ref 70–99)

## 2013-03-08 ENCOUNTER — Encounter (HOSPITAL_COMMUNITY): Payer: Self-pay | Admitting: *Deleted

## 2013-03-08 ENCOUNTER — Emergency Department (HOSPITAL_COMMUNITY)
Admission: EM | Admit: 2013-03-08 | Discharge: 2013-03-08 | Disposition: A | Discharge status: Home / Self Care | Payer: 59 | Source: Home / Self Care

## 2013-03-08 DIAGNOSIS — M329 Systemic lupus erythematosus, unspecified: Secondary | ICD-10-CM

## 2013-03-08 MED ORDER — PREDNISONE 10 MG PO TABS: 10.0000 mg | ORAL_TABLET | Freq: Every day | ORAL | Status: DC

## 2013-03-08 NOTE — ED Provider Notes (Signed)
Medical screening examination/treatment/procedure(s) were performed by non-physician practitioner and as supervising physician I was immediately available for consultation/collaboration.  David Keller, M.D.  David C Keller, MD 03/08/13 2112 

## 2013-03-08 NOTE — ED Notes (Signed)
Pt  Has  History  Of  lupus      She  Has  Been  Out  Of  Her          Prednisone  For  3  Days      She     Also  Has  A history of  htn            She  Reports  Aching in legs   Swelling of  Extremities            She  Is  Sitting upright  On  Exam table  Speaking  In  Complete  sentances

## 2013-03-08 NOTE — ED Provider Notes (Signed)
History     CSN: [id number]  Arrival date & time 03/08/13  1454   None     Chief Complaint  Patient presents with  . Joint Swelling    (Consider location/radiation/quality/duration/timing/severity/associated sxs/prior treatment) HPI Comments: 42 year old female with a history of lupus erythematosus states it proximally 5 days ago she developed a flareup consisting of generalized swelling worse at the joints and the face and fatigue. She ran out of her prednisone approximately 3 days ago. She called her rheumatologist and they declined to refill it. She was then told to come to the urgent care. She is requesting a refill of her prednisone.   Past Medical History  Diagnosis Date  . Lupus (systemic lupus erythematosus)   . Arthritis     Past Surgical History  Procedure Laterality Date  . Abdominal hysterectomy    . Knee surgery    . Tubal ligation      History reviewed. No pertinent family history.  History  Substance Use Topics  . Smoking status: Not on file  . Smokeless tobacco: Not on file  . Alcohol Use: No    OB History   Grav Para Term Preterm Abortions TAB SAB Ect Mult Living                  Review of Systems  Constitutional: Positive for activity change and fatigue.  HENT: Positive for facial swelling. Negative for nosebleeds, sore throat and trouble swallowing.   Respiratory: Negative.   Cardiovascular: Negative.   Gastrointestinal: Negative.   Genitourinary: Negative.   Musculoskeletal: Positive for arthralgias.  Allergic/Immunologic: Positive for immunocompromised state.    Allergies  Dilaudid; Erythromycin; and Latex  Home Medications   Current Outpatient Rx  Name  Route  Sig  Dispense  Refill  . predniSONE (DELTASONE) 5 MG tablet   Oral   Take 7.5 mg by mouth daily.         . folic acid (FOLVITE) 1 MG tablet   Oral   Take 1 mg by mouth daily.         Marland Kitchen losartan-hydrochlorothiazide (HYZAAR) 100-12.5 MG per tablet   Oral   Take 1  tablet by mouth daily.         . methotrexate 2.5 MG tablet   Oral   Take 7.5 mg by mouth once a week.         . Multiple Vitamin (MULTIVITAMIN WITH MINERALS) TABS   Oral   Take 1 tablet by mouth daily.         . predniSONE (DELTASONE) 10 MG tablet   Oral   Take 1 tablet (10 mg total) by mouth daily. Take with food or milk.   30 tablet   0   . Vitamin D, Ergocalciferol, (DRISDOL) 50000 UNITS CAPS   Oral   Take 50,000 Units by mouth every 7 (seven) days.           BP 156/98  Pulse 81  Temp(Src) 98.9 F (37.2 C) (Oral)  Resp 16  SpO2 100%  Physical Exam  Nursing note and vitals reviewed. Constitutional: She is oriented to person, place, and time. She appears well-developed and well-nourished. No distress.  Eyes: EOM are normal.  Neck: Neck supple.  Cardiovascular: Normal rate, regular rhythm and normal heart sounds.   Pulmonary/Chest: Effort normal and breath sounds normal. No respiratory distress.  Abdominal: Soft. She exhibits no distension. There is no tenderness.  Musculoskeletal: She exhibits tenderness.  Joint tenderness in the knees and hands.  Mild swelling to the MCPs of the right hand.  Neurological: She is alert and oriented to person, place, and time.  Skin: Skin is warm and dry. No rash noted.    ED Course  Procedures (including critical care time)  Labs Reviewed - No data to display No results found.   1. Lupus (systemic lupus erythematosus)   2. Systemic lupus erythematosus arthritis       MDM  Refill prednisone 10 mg one daily, take with food or milk. Call your rheumatologist for a followup as soon as possible. 3 new symptoms problems or worsening may call your PCP or if necessary return to the urgent care.       Hayden Rasmussen, NP 03/08/13 1648  Hayden Rasmussen, NP 03/08/13 [phone]

## 2013-09-06 ENCOUNTER — Emergency Department (HOSPITAL_COMMUNITY)
Admission: EM | Admit: 2013-09-06 | Discharge: 2013-09-06 | Disposition: A | Discharge status: Home / Self Care | Payer: 59 | Attending: Emergency Medicine | Admitting: Emergency Medicine

## 2013-09-06 ENCOUNTER — Encounter (HOSPITAL_COMMUNITY): Payer: Self-pay | Admitting: Emergency Medicine

## 2013-09-06 DIAGNOSIS — R21 Rash and other nonspecific skin eruption: Secondary | ICD-10-CM | POA: Insufficient documentation

## 2013-09-06 DIAGNOSIS — J45901 Unspecified asthma with (acute) exacerbation: Secondary | ICD-10-CM | POA: Insufficient documentation

## 2013-09-06 DIAGNOSIS — IMO0001 Reserved for inherently not codable concepts without codable children: Secondary | ICD-10-CM | POA: Insufficient documentation

## 2013-09-06 DIAGNOSIS — I10 Essential (primary) hypertension: Secondary | ICD-10-CM | POA: Insufficient documentation

## 2013-09-06 DIAGNOSIS — Z79899 Other long term (current) drug therapy: Secondary | ICD-10-CM | POA: Insufficient documentation

## 2013-09-06 DIAGNOSIS — Z91040 Latex allergy status: Secondary | ICD-10-CM | POA: Insufficient documentation

## 2013-09-06 DIAGNOSIS — M329 Systemic lupus erythematosus, unspecified: Secondary | ICD-10-CM | POA: Insufficient documentation

## 2013-09-06 HISTORY — DX: Fibromyalgia: M79.7

## 2013-09-06 HISTORY — DX: Essential (primary) hypertension: I10

## 2013-09-06 HISTORY — DX: Unspecified asthma, uncomplicated: J45.909

## 2013-09-06 MED ORDER — PREDNISONE 20 MG PO TABS
60.0000 mg | ORAL_TABLET | Freq: Once | ORAL | Status: AC
  Administered 2013-09-06: 60 mg via ORAL
  Filled 2013-09-06: qty 3

## 2013-09-06 MED ORDER — PREDNISONE 10 MG PO TABS: 40.0000 mg | ORAL_TABLET | Freq: Every day | ORAL | Status: DC

## 2013-09-06 NOTE — ED Notes (Signed)
Pt. requesting prescriptions for her Lupus and Hypertension , pt. stated that her PCP will not refill his prescriptions because she refused chemotherapy for her SLE . No antihypertensive medications for 4 months .

## 2013-09-06 NOTE — ED Notes (Signed)
Pt comfortable with d/c and f/u instructions. Prescriptions x1 

## 2013-09-06 NOTE — ED Provider Notes (Signed)
CSN: [id number]     Arrival date & time 09/06/13  2024 History   First MD Initiated Contact with Patient 09/06/13 2245     Chief Complaint  Patient presents with  . Medication Refill  . Hypertension   (Consider location/radiation/quality/duration/timing/severity/associated sxs/prior Treatment) Patient is a 42 y.o. female presenting with hypertension. The history is provided by the patient.  Hypertension Associated symptoms include shortness of breath. Pertinent negatives include no chest pain, no abdominal pain and no headaches.   patient with a history of systemic lupus. Followed by Dorothyann Peng. Also followed locally by rheumatology. Patient's rheumatologist is trying to treat her with chemotherapy has not been working as per the patient. She feels as if her lupus is not responding well she was changed rheumatologist wants to go back on a short course of steroids. Patient denies fevers does have shortness of breath but not severe states that her blood pressure tends to get high when she is lupus flare. Blood pressure here today is 168/106 over the past few days blood pressure has been normal. Denies significant chest pain severe shortness of breath fevers abdominal pain nausea vomiting or diarrhea.  Past Medical History  Diagnosis Date  . Lupus (systemic lupus erythematosus)   . Arthritis   . Hypertension   . Asthma   . Fibromyalgia    Past Surgical History  Procedure Laterality Date  . Abdominal hysterectomy    . Knee surgery    . Tubal ligation     No family history on file. History  Substance Use Topics  . Smoking status: Not on file  . Smokeless tobacco: Not on file  . Alcohol Use: No   OB History   Grav Para Term Preterm Abortions TAB SAB Ect Mult Living                 Review of Systems  Constitutional: Negative for fever.  HENT: Negative for congestion.   Eyes: Negative for visual disturbance.  Respiratory: Positive for shortness of breath.   Cardiovascular:  Negative for chest pain.  Gastrointestinal: Negative for abdominal pain.  Genitourinary: Negative for dysuria.  Musculoskeletal: Positive for myalgias.  Skin: Positive for rash.  Neurological: Negative for headaches.  Hematological: Does not bruise/bleed easily.  Psychiatric/Behavioral: Negative for confusion.    Allergies  Dilaudid; Erythromycin; and Latex  Home Medications   Current Outpatient Rx  Name  Route  Sig  Dispense  Refill  . cetirizine (ZYRTEC) 10 MG tablet   Oral   Take 10 mg by mouth daily.         . Multiple Vitamin (MULTIVITAMIN WITH MINERALS) TABS   Oral   Take 1 tablet by mouth daily.         . naproxen (NAPROSYN) 250 MG tablet   Oral   Take 1,250 mg by mouth daily as needed (pain).         . predniSONE (DELTASONE) 10 MG tablet   Oral   Take 4 tablets (40 mg total) by mouth daily.   28 tablet   0    BP 168/106  Pulse 81  Temp(Src) 98.5 F (36.9 C) (Oral)  Resp 14  SpO2 99% Physical Exam  Nursing note and vitals reviewed. Constitutional: She is oriented to person, place, and time. She appears well-developed and well-nourished. No distress.  HENT:  Head: Normocephalic and atraumatic.  Mouth/Throat: Oropharynx is clear and moist.  Eyes: Conjunctivae and EOM are normal. Pupils are equal, round, and reactive to light.  Neck:  Normal range of motion. Neck supple.  Cardiovascular: Normal rate, regular rhythm and normal heart sounds.   No murmur heard. Pulmonary/Chest: Effort normal and breath sounds normal. No respiratory distress. She has no wheezes. She has no rales.  Abdominal: Soft. Bowel sounds are normal. There is no tenderness.  Musculoskeletal: Normal range of motion.  Neurological: She is alert and oriented to person, place, and time. No cranial nerve deficit. She exhibits normal muscle tone. Coordination normal.  Skin: Skin is warm. Rash noted.    ED Course  Procedures (including critical care time) Labs Review Labs Reviewed - No  data to display Imaging Review No results found.  MDM   1. Lupus (systemic lupus erythematosus)   2. Hypertension    Patient clinically feels as if she is having a flare of her lupus. States her blood pressure tends to go up when that occurs she is not normally on hypertensive meds. No fever oxygen saturation is her normal lung sounds are clear bilaterally we'll treat with a short course of steroids. Patient's trying to find a new rheumatologist she does have a primary care Dr. to followup with. Patient given 60 mg prednisone here in the emergency department. Patient is nontoxic no acute distress.    Shelda Jakes, MD 09/06/13 [phone]

## 2014-01-25 ENCOUNTER — Encounter (HOSPITAL_COMMUNITY): Payer: Self-pay | Admitting: Emergency Medicine

## 2014-01-25 ENCOUNTER — Emergency Department (HOSPITAL_COMMUNITY): Payer: 59

## 2014-01-25 ENCOUNTER — Inpatient Hospital Stay (HOSPITAL_COMMUNITY)
Admission: EM | Admit: 2014-01-25 | Discharge: 2014-01-30 | DRG: 292 | Disposition: A | Discharge status: Home / Self Care | Payer: 59 | Attending: Internal Medicine | Admitting: Internal Medicine

## 2014-01-25 DIAGNOSIS — M329 Systemic lupus erythematosus, unspecified: Secondary | ICD-10-CM | POA: Diagnosis present

## 2014-01-25 DIAGNOSIS — J189 Pneumonia, unspecified organism: Secondary | ICD-10-CM

## 2014-01-25 DIAGNOSIS — I5041 Acute combined systolic (congestive) and diastolic (congestive) heart failure: Secondary | ICD-10-CM

## 2014-01-25 DIAGNOSIS — I517 Cardiomegaly: Secondary | ICD-10-CM

## 2014-01-25 DIAGNOSIS — R0602 Shortness of breath: Secondary | ICD-10-CM | POA: Diagnosis present

## 2014-01-25 DIAGNOSIS — I5043 Acute on chronic combined systolic (congestive) and diastolic (congestive) heart failure: Secondary | ICD-10-CM | POA: Diagnosis not present

## 2014-01-25 DIAGNOSIS — R040 Epistaxis: Secondary | ICD-10-CM | POA: Diagnosis present

## 2014-01-25 DIAGNOSIS — Z7982 Long term (current) use of aspirin: Secondary | ICD-10-CM

## 2014-01-25 DIAGNOSIS — R0982 Postnasal drip: Secondary | ICD-10-CM | POA: Diagnosis present

## 2014-01-25 DIAGNOSIS — I10 Essential (primary) hypertension: Secondary | ICD-10-CM | POA: Diagnosis present

## 2014-01-25 DIAGNOSIS — R0609 Other forms of dyspnea: Secondary | ICD-10-CM

## 2014-01-25 DIAGNOSIS — I509 Heart failure, unspecified: Secondary | ICD-10-CM

## 2014-01-25 DIAGNOSIS — I428 Other cardiomyopathies: Secondary | ICD-10-CM | POA: Diagnosis present

## 2014-01-25 DIAGNOSIS — IMO0001 Reserved for inherently not codable concepts without codable children: Secondary | ICD-10-CM | POA: Diagnosis present

## 2014-01-25 DIAGNOSIS — M129 Arthropathy, unspecified: Secondary | ICD-10-CM | POA: Diagnosis present

## 2014-01-25 DIAGNOSIS — E876 Hypokalemia: Secondary | ICD-10-CM | POA: Diagnosis present

## 2014-01-25 DIAGNOSIS — J45909 Unspecified asthma, uncomplicated: Secondary | ICD-10-CM | POA: Diagnosis present

## 2014-01-25 DIAGNOSIS — I420 Dilated cardiomyopathy: Secondary | ICD-10-CM

## 2014-01-25 LAB — CBC
HCT: 33.4 % — ABNORMAL LOW (ref 36.0–46.0)
Hemoglobin: 11.1 g/dL — ABNORMAL LOW (ref 12.0–15.0)
MCH: 27.3 pg (ref 26.0–34.0)
MCHC: 33.2 g/dL (ref 30.0–36.0)
MCV: 82.3 fL (ref 78.0–100.0)
Platelets: 140 10*3/uL — ABNORMAL LOW (ref 150–400)
RBC: 4.06 MIL/uL (ref 3.87–5.11)
RDW: 12.6 % (ref 11.5–15.5)
WBC: 3.8 10*3/uL — ABNORMAL LOW (ref 4.0–10.5)

## 2014-01-25 LAB — BASIC METABOLIC PANEL
BUN: 11 mg/dL (ref 6–23)
CO2: 22 mEq/L (ref 19–32)
Calcium: 8.8 mg/dL (ref 8.4–10.5)
Chloride: 104 mEq/L (ref 96–112)
Creatinine, Ser: 0.64 mg/dL (ref 0.50–1.10)
GFR calc Af Amer: >90 mL/min (ref >90)
GFR calc non Af Amer: >90 mL/min (ref >90)
Glucose, Bld: 103 mg/dL — ABNORMAL HIGH (ref 70–99)
Potassium: 3.6 mEq/L — ABNORMAL LOW (ref 3.7–5.3)
Sodium: 138 mEq/L (ref 137–147)

## 2014-01-25 LAB — POCT I-STAT TROPONIN I: Troponin i, poc: 0.03 ng/mL (ref 0.00–0.08)

## 2014-01-25 LAB — D-DIMER, QUANTITATIVE: D-Dimer, Quant: 1.97 ug/mL-FEU — ABNORMAL HIGH (ref 0.00–0.48)

## 2014-01-25 LAB — PRO B NATRIURETIC PEPTIDE: Pro B Natriuretic peptide (BNP): 1240 pg/mL — ABNORMAL HIGH (ref 0–125)

## 2014-01-25 MED ORDER — METHYLPREDNISOLONE SODIUM SUCC 125 MG IJ SOLR
125.0000 mg | Freq: Once | INTRAMUSCULAR | Status: AC
  Administered 2014-01-25: 125 mg via INTRAVENOUS
  Filled 2014-01-25: qty 2

## 2014-01-25 MED ORDER — IPRATROPIUM-ALBUTEROL 0.5-2.5 (3) MG/3ML IN SOLN
3.0000 mL | Freq: Once | RESPIRATORY_TRACT | Status: AC
  Administered 2014-01-25: 3 mL via RESPIRATORY_TRACT
  Filled 2014-01-25: qty 3

## 2014-01-25 MED ORDER — FUROSEMIDE 10 MG/ML IJ SOLN
40.0000 mg | Freq: Once | INTRAMUSCULAR | Status: AC
  Administered 2014-01-25: 40 mg via INTRAVENOUS
  Filled 2014-01-25: qty 4

## 2014-01-25 NOTE — ED Notes (Signed)
Pt reports increased ShOB for the past week with increasing from her Lupus symptoms, reports she got "winded" at a dance rehearsal on Friday, but felt dramatically ShOB on Sunday, used at home neb treatments and felt better, but today neb treatments have not relieved the ShOB, Pt noted to have labored breathing in triage, pt placed on [place], 2L for comfort. Pt a&o x4.

## 2014-01-25 NOTE — ED Provider Notes (Addendum)
CSN: [id number]     Arrival date & time 01/25/14  2120 History   First MD Initiated Contact with Patient 01/25/14 2132     Chief Complaint  Patient presents with  . Shortness of Breath    HPI Patient presents to emergency room with complaints of increasing shortness of breath over the last several days. Patient states that she has had some trouble with nasal congestion and sinus congestion. She started having some episodes of dyspnea. Patient noticed that when she was dancing on Friday she became short of breath which was unusual. The symptoms increased somewhat rapidly on Sunday. She used her home nebulizer treatments and improved. Today however throughout the day her symptoms have worsened despite albuterol nebulizer treatments the patient has a history of lupus as well as asthma. She has had some symptoms of joint aches but has not noticed any swelling. She has had some left-sided chest pain associated with her breathing. She denies any fever. Her cough has been nonproductive.  Patient's symptoms increase whenever she tries to walk or exert herself. They're only slightly better when she is resting. Past Medical History  Diagnosis Date  . Lupus (systemic lupus erythematosus)   . Arthritis   . Hypertension   . Asthma   . Fibromyalgia    Past Surgical History  Procedure Laterality Date  . Knee surgery    . Tubal ligation    . Abdominal hysterectomy  2009   History reviewed. No pertinent family history. History  Substance Use Topics  . Smoking status: Never Smoker   . Smokeless tobacco: Never Used  . Alcohol Use: No   OB History   Grav Para Term Preterm Abortions TAB SAB Ect Mult Living                 Review of Systems  All other systems reviewed and are negative.    Allergies  Dilaudid; Erythromycin; and Latex  Home Medications   Current Outpatient Rx  Name  Route  Sig  Dispense  Refill  . albuterol (PROVENTIL HFA;VENTOLIN HFA) 108 (90 BASE) MCG/ACT inhaler    Inhalation   Inhale 2 puffs into the lungs every 6 (six) hours as needed for wheezing or shortness of breath.         Marland Kitchen albuterol (PROVENTIL) (2.5 MG/3ML) 0.083% nebulizer solution   Nebulization   Take 2.5 mg by nebulization every 6 (six) hours as needed for wheezing or shortness of breath.         Marland Kitchen aspirin 81 MG chewable tablet   Oral   Chew 162 mg by mouth daily.         . hydrocortisone cream 1 %   Topical   Apply 1 application topically 2 (two) times daily as needed for itching.         . hydroxychloroquine (PLAQUENIL) 200 MG tablet   Oral   Take 200 mg by mouth daily.         . Multiple Vitamin (MULTIVITAMIN WITH MINERALS) TABS   Oral   Take 1 tablet by mouth daily.         . naproxen sodium (ANAPROX) 220 MG tablet   Oral   Take 880 mg by mouth 2 (two) times daily as needed (pain).         . predniSONE (DELTASONE) 20 MG tablet   Oral   Take 20 mg by mouth daily with breakfast.          BP 149/91  Pulse 103  Temp(Src) 98.1 F (36.7 C) (Oral)  Resp 22  Ht 5\' 7"  (1.702 m)  Wt 165 lb (74.844 kg)  BMI 25.84 kg/m2  SpO2 96% Physical Exam  Nursing note and vitals reviewed. Constitutional: She appears well-developed and well-nourished. No distress.  HENT:  Head: Normocephalic and atraumatic.  Right Ear: External ear normal.  Left Ear: External ear normal.  Eyes: Conjunctivae are normal. Right eye exhibits no discharge. Left eye exhibits no discharge. No scleral icterus.  Neck: Neck supple. No tracheal deviation present.  Cardiovascular: Normal rate, regular rhythm and intact distal pulses.   Pulmonary/Chest: No stridor. Tachypnea noted. No respiratory distress. She has no wheezes. She has rales in the left lower field.  Labored breathing  Abdominal: Soft. Bowel sounds are normal. She exhibits no distension. There is no tenderness. There is no rebound and no guarding.  Musculoskeletal: She exhibits no edema and no tenderness.  Neurological: She is  alert. She has normal strength. No cranial nerve deficit (no facial droop, extraocular movements intact, no slurred speech) or sensory deficit. She exhibits normal muscle tone. She displays no seizure activity. Coordination normal.  Skin: Skin is warm and dry. No rash noted.  Psychiatric: She has a normal mood and affect.    ED Course  Procedures (including critical care time) 20 gauge IV placed in the right ac using ultrasound guidance by myself.  Blood drew back and easily flushed.  Tolerated well.   Labs Review Labs Reviewed  BASIC METABOLIC PANEL - Abnormal; Notable for the following:    Potassium 3.6 (*)    Glucose, Bld 103 (*)    All other components within normal limits  CBC - Abnormal; Notable for the following:    WBC 3.8 (*)    Hemoglobin 11.1 (*)    HCT 33.4 (*)    Platelets 140 (*)    All other components within normal limits  D-DIMER, QUANTITATIVE - Abnormal; Notable for the following:    D-Dimer, Quant 1.97 (*)    All other components within normal limits  PRO B NATRIURETIC PEPTIDE - Abnormal; Notable for the following:    Pro B Natriuretic peptide (BNP) 1240.0 (*)    All other components within normal limits  POCT I-STAT TROPONIN I   Imaging Review Dg Chest 2 View  01/25/2014   CLINICAL DATA:  Chest pain and shortness of breath.  EXAM: CHEST  2 VIEW  COMPARISON:  PA and lateral chest 02/14/2009.  FINDINGS: There is extensive bilateral airspace disease. Cardiomegaly is seen. Small bilateral pleural effusions are noted. No pneumothorax.  IMPRESSION: Extensive bilateral airspace disease with cardiomegaly and small effusions is likely due to pulmonary edema. Pneumonia cannot be excluded.   Electronically Signed   By: Inge Rise M.D.   On: 01/25/2014 23:21    EKG Interpretation    Date/Time:  Tuesday January 25 2014 21:32:46 EST Ventricular Rate:  93 PR Interval:  214 QRS Duration: 81 QT Interval:  382 QTC Calculation: 475 R Axis:   -20 Text  Interpretation:  Sinus rhythm Prolonged PR interval , new since last tracing Probable left atrial enlargement Left ventricular hypertrophy , new since last tracing Confirmed by KNAPP  MD-J, JON ([id number]) on 01/25/2014 9:43:14 PM           Medications  furosemide (LASIX) injection 40 mg (not administered)  methylPREDNISolone sodium succinate (SOLU-MEDROL) 125 mg/2 mL injection 125 mg (125 mg Intravenous Given 01/25/14 2310)  ipratropium-albuterol (DUONEB) 0.5-2.5 (3) MG/3ML nebulizer solution 3 mL (3 mLs  Nebulization Given 01/25/14 2224)    MDM   1. Cardiomegaly   2. Pneumonitis   3. Lupus      Pt with increasing shortness of breath with history of lupus.  CXR suggests diffuse airspace disease.  Remains tachypnic after steroids and duoneb.  Doubt symptoms are related to asthma.  ? Pneumonitis vs chf.  Pt does have cardiomegally compared to previous CXR.   Will admit to hospital for further workup.  May benefit from echocardiogram.    Given IV steroids for lupus.  Will give dose of lasix.  Kathalene Frames, MD 01/25/14 2351  Discussed with Dr Alcario Drought.  CT scan of the chest ordered per his request.    Kathalene Frames, MD 01/26/14 [phone]

## 2014-01-25 NOTE — Progress Notes (Signed)
Patient does not have IV access at this time. Attending notified.

## 2014-01-26 ENCOUNTER — Encounter (HOSPITAL_COMMUNITY): Payer: Self-pay

## 2014-01-26 ENCOUNTER — Inpatient Hospital Stay (HOSPITAL_COMMUNITY): Payer: 59

## 2014-01-26 DIAGNOSIS — R040 Epistaxis: Secondary | ICD-10-CM | POA: Diagnosis present

## 2014-01-26 DIAGNOSIS — Z7982 Long term (current) use of aspirin: Secondary | ICD-10-CM | POA: Diagnosis not present

## 2014-01-26 DIAGNOSIS — J45909 Unspecified asthma, uncomplicated: Secondary | ICD-10-CM | POA: Diagnosis present

## 2014-01-26 DIAGNOSIS — M329 Systemic lupus erythematosus, unspecified: Secondary | ICD-10-CM | POA: Diagnosis present

## 2014-01-26 DIAGNOSIS — M129 Arthropathy, unspecified: Secondary | ICD-10-CM | POA: Diagnosis present

## 2014-01-26 DIAGNOSIS — I059 Rheumatic mitral valve disease, unspecified: Secondary | ICD-10-CM

## 2014-01-26 DIAGNOSIS — IMO0001 Reserved for inherently not codable concepts without codable children: Secondary | ICD-10-CM | POA: Diagnosis present

## 2014-01-26 DIAGNOSIS — I10 Essential (primary) hypertension: Secondary | ICD-10-CM | POA: Diagnosis present

## 2014-01-26 DIAGNOSIS — R0982 Postnasal drip: Secondary | ICD-10-CM | POA: Diagnosis present

## 2014-01-26 DIAGNOSIS — R0602 Shortness of breath: Secondary | ICD-10-CM

## 2014-01-26 DIAGNOSIS — I5043 Acute on chronic combined systolic (congestive) and diastolic (congestive) heart failure: Secondary | ICD-10-CM | POA: Diagnosis present

## 2014-01-26 DIAGNOSIS — I428 Other cardiomyopathies: Secondary | ICD-10-CM | POA: Diagnosis present

## 2014-01-26 DIAGNOSIS — E876 Hypokalemia: Secondary | ICD-10-CM | POA: Diagnosis present

## 2014-01-26 DIAGNOSIS — R0609 Other forms of dyspnea: Secondary | ICD-10-CM

## 2014-01-26 DIAGNOSIS — I509 Heart failure, unspecified: Secondary | ICD-10-CM | POA: Diagnosis present

## 2014-01-26 LAB — BASIC METABOLIC PANEL
BUN: 13 mg/dL (ref 6–23)
CO2: 24 mEq/L (ref 19–32)
Calcium: 9.2 mg/dL (ref 8.4–10.5)
Chloride: 100 mEq/L (ref 96–112)
Creatinine, Ser: 0.56 mg/dL (ref 0.50–1.10)
GFR calc Af Amer: >90 mL/min (ref >90)
GFR calc non Af Amer: >90 mL/min (ref >90)
Glucose, Bld: 154 mg/dL — ABNORMAL HIGH (ref 70–99)
Potassium: 3.2 mEq/L — ABNORMAL LOW (ref 3.7–5.3)
Sodium: 138 mEq/L (ref 137–147)

## 2014-01-26 LAB — CBC
HCT: 36.5 % (ref 36.0–46.0)
Hemoglobin: 12.2 g/dL (ref 12.0–15.0)
MCH: 27.7 pg (ref 26.0–34.0)
MCHC: 33.4 g/dL (ref 30.0–36.0)
MCV: 82.8 fL (ref 78.0–100.0)
Platelets: 183 10*3/uL (ref 150–400)
RBC: 4.41 MIL/uL (ref 3.87–5.11)
RDW: 12.6 % (ref 11.5–15.5)
WBC: 4.6 10*3/uL (ref 4.0–10.5)

## 2014-01-26 LAB — TROPONIN I
Troponin I: <0.3 ng/mL (ref <0.30)
Troponin I: <0.3 ng/mL (ref <0.30)

## 2014-01-26 LAB — INFLUENZA PANEL BY PCR (TYPE A & B)
H1N1 flu by pcr: NOT DETECTED
Influenza A By PCR: NEGATIVE
Influenza B By PCR: NEGATIVE

## 2014-01-26 MED ORDER — FUROSEMIDE 10 MG/ML IJ SOLN
40.0000 mg | Freq: Every day | INTRAMUSCULAR | Status: DC
  Administered 2014-01-26 – 2014-01-28 (×3): 40 mg via INTRAVENOUS
  Filled 2014-01-26 (×3): qty 4

## 2014-01-26 MED ORDER — NAPROXEN SODIUM 275 MG PO TABS
825.0000 mg | ORAL_TABLET | Freq: Two times a day (BID) | ORAL | Status: DC | PRN
  Administered 2014-01-26: 825 mg via ORAL
  Filled 2014-01-26 (×3): qty 3

## 2014-01-26 MED ORDER — HEPARIN SODIUM (PORCINE) 5000 UNIT/ML IJ SOLN
5000.0000 [IU] | Freq: Three times a day (TID) | INTRAMUSCULAR | Status: DC
  Administered 2014-01-26 – 2014-01-30 (×12): 5000 [IU] via SUBCUTANEOUS
  Filled 2014-01-26 (×16): qty 1

## 2014-01-26 MED ORDER — ALBUTEROL SULFATE (2.5 MG/3ML) 0.083% IN NEBU: 3.0000 mL | INHALATION_SOLUTION | Freq: Four times a day (QID) | RESPIRATORY_TRACT | Status: DC | PRN

## 2014-01-26 MED ORDER — KETOROLAC TROMETHAMINE 15 MG/ML IJ SOLN
15.0000 mg | Freq: Four times a day (QID) | INTRAMUSCULAR | Status: DC | PRN
  Administered 2014-01-26 – 2014-01-28 (×4): 15 mg via INTRAVENOUS
  Filled 2014-01-26 (×7): qty 1

## 2014-01-26 MED ORDER — PREDNISONE 20 MG PO TABS
20.0000 mg | ORAL_TABLET | Freq: Every day | ORAL | Status: DC
  Administered 2014-01-26 – 2014-01-27 (×2): 20 mg via ORAL
  Filled 2014-01-26 (×3): qty 1

## 2014-01-26 MED ORDER — PNEUMOCOCCAL VAC POLYVALENT 25 MCG/0.5ML IJ INJ
0.5000 mL | INJECTION | INTRAMUSCULAR | Status: DC
  Filled 2014-01-26 (×2): qty 0.5

## 2014-01-26 MED ORDER — ALBUTEROL SULFATE HFA 108 (90 BASE) MCG/ACT IN AERS: 2.0000 | INHALATION_SPRAY | Freq: Four times a day (QID) | RESPIRATORY_TRACT | Status: DC | PRN

## 2014-01-26 MED ORDER — NAPROXEN SODIUM 550 MG PO TABS
550.0000 mg | ORAL_TABLET | Freq: Two times a day (BID) | ORAL | Status: DC | PRN
  Administered 2014-01-26: 550 mg via ORAL
  Filled 2014-01-26 (×2): qty 1

## 2014-01-26 MED ORDER — HYDROXYCHLOROQUINE SULFATE 200 MG PO TABS
200.0000 mg | ORAL_TABLET | Freq: Every day | ORAL | Status: DC
  Administered 2014-01-26 – 2014-01-30 (×5): 200 mg via ORAL
  Filled 2014-01-26 (×5): qty 1

## 2014-01-26 MED ORDER — ASPIRIN 81 MG PO CHEW
162.0000 mg | CHEWABLE_TABLET | Freq: Every day | ORAL | Status: DC
  Administered 2014-01-26 – 2014-01-30 (×5): 162 mg via ORAL
  Filled 2014-01-26 (×4): qty 2

## 2014-01-26 MED ORDER — IOHEXOL 350 MG/ML SOLN
100.0000 mL | Freq: Once | INTRAVENOUS | Status: AC | PRN
  Administered 2014-01-26: 100 mL via INTRAVENOUS

## 2014-01-26 MED ORDER — MORPHINE SULFATE 2 MG/ML IJ SOLN
1.0000 mg | INTRAMUSCULAR | Status: DC | PRN
  Administered 2014-01-26 – 2014-01-28 (×9): 1 mg via INTRAVENOUS
  Filled 2014-01-26 (×10): qty 1

## 2014-01-26 MED ORDER — ALBUTEROL SULFATE (2.5 MG/3ML) 0.083% IN NEBU: 2.5000 mg | INHALATION_SOLUTION | Freq: Four times a day (QID) | RESPIRATORY_TRACT | Status: DC | PRN

## 2014-01-26 MED ORDER — ADULT MULTIVITAMIN W/MINERALS CH
1.0000 | ORAL_TABLET | Freq: Every day | ORAL | Status: DC
  Administered 2014-01-26 – 2014-01-30 (×5): 1 via ORAL
  Filled 2014-01-26 (×5): qty 1

## 2014-01-26 MED ORDER — SODIUM CHLORIDE 0.9 % IJ SOLN
3.0000 mL | Freq: Two times a day (BID) | INTRAMUSCULAR | Status: DC
  Administered 2014-01-26 – 2014-01-30 (×7): 3 mL via INTRAVENOUS

## 2014-01-26 NOTE — Progress Notes (Signed)
Pt's BP was elevated to 141/101. MD aware. No new orders. Will continue to monitor.

## 2014-01-26 NOTE — Progress Notes (Signed)
Pt BP 153/92 HR 89.  Pt pain uncontrolled with current pain medication regimen.  MD notified and aware.  Will continue to monitor.

## 2014-01-26 NOTE — H&P (Signed)
[facility] History and Physical  Kami Kube SWN:[id number] DOB: 06-10-1971 DOA: 01/25/2014  Referring physician: EDP PCP: Maximino Greenland, MD   Chief Complaint: SOB   HPI: Christine Mcdowell is a 43 y.o. female who presents to the ED with c/o increasing SOB over the last couple of days.  Did have some issues with sinus congestion and thought she was coming down with a cold, but this turned into severe dyspnea.  She is not usually short of breath.  Home nebulizer treatments have helped somewhat initially but today her nebs didn't help much.  Has h/o Lupus as well as Asthma.  Cough is non-productive, no fever, symptoms worse with exertion better at rest.  Review of Systems: Systems reviewed.  As above, otherwise negative  Past Medical History  Diagnosis Date  . Lupus (systemic lupus erythematosus)   . Arthritis   . Hypertension   . Asthma   . Fibromyalgia    Past Surgical History  Procedure Laterality Date  . Knee surgery    . Tubal ligation    . Abdominal hysterectomy  2009   Social History:  reports that she has never smoked. She has never used smokeless tobacco. She reports that she does not drink alcohol or use illicit drugs.  Allergies  Allergen Reactions  . Dilaudid [Hydromorphone Hcl] Nausea And Vomiting and Other (See Comments)    Raises blood pressure   . Erythromycin Nausea And Vomiting  . Latex Hives    History reviewed. No pertinent family history.   Prior to Admission medications   Medication Sig Start Date End Date Taking? Authorizing Provider  albuterol (PROVENTIL HFA;VENTOLIN HFA) 108 (90 BASE) MCG/ACT inhaler Inhale 2 puffs into the lungs every 6 (six) hours as needed for wheezing or shortness of breath.   Yes Historical Provider, MD  albuterol (PROVENTIL) (2.5 MG/3ML) 0.083% nebulizer solution Take 2.5 mg by nebulization every 6 (six) hours as needed for wheezing or shortness of breath.   Yes Historical Provider, MD  aspirin 81 MG chewable  tablet Chew 162 mg by mouth daily.   Yes Historical Provider, MD  hydrocortisone cream 1 % Apply 1 application topically 2 (two) times daily as needed for itching.   Yes Historical Provider, MD  hydroxychloroquine (PLAQUENIL) 200 MG tablet Take 200 mg by mouth daily.   Yes Historical Provider, MD  Multiple Vitamin (MULTIVITAMIN WITH MINERALS) TABS Take 1 tablet by mouth daily.   Yes Historical Provider, MD  naproxen sodium (ANAPROX) 220 MG tablet Take 880 mg by mouth 2 (two) times daily as needed (pain).   Yes Historical Provider, MD  predniSONE (DELTASONE) 20 MG tablet Take 20 mg by mouth daily with breakfast.   Yes Historical Provider, MD   Physical Exam: Filed Vitals:   01/25/14 2321  BP: 149/91  Pulse: 103  Temp:   Resp: 22    BP 149/91  Pulse 103  Temp(Src) 98.1 F (36.7 C) (Oral)  Resp 22  Ht 5\' 7"  (1.702 m)  Wt 74.844 kg (165 lb)  BMI 25.84 kg/m2  SpO2 96%  General Appearance:    Alert, oriented, Patient unable to complete a sentence without taking another breath, appears stated age  Head:    Normocephalic, atraumatic  Eyes:    PERRL, EOMI, sclera non-icteric        Nose:   Nares without drainage or epistaxis. Mucosa, turbinates normal  Throat:   Moist mucous membranes. Oropharynx without erythema or exudate.  Neck:   Supple. No carotid bruits.  No  thyromegaly.  No lymphadenopathy.   Back:     No CVA tenderness, no spinal tenderness  Lungs:     Inspiratory crackles at bases  Chest wall:    No tenderness to palpitation  Heart:    Regular rate and rhythm without murmurs, gallops, rubs  Abdomen:     Soft, non-tender, nondistended, normal bowel sounds, no organomegaly  Genitalia:    deferred  Rectal:    deferred  Extremities:   No clubbing, cyanosis or edema.  Pulses:   2+ and symmetric all extremities  Skin:   Skin color, texture, turgor normal, no rashes or lesions  Lymph nodes:   Cervical, supraclavicular, and axillary nodes normal  Neurologic:   CNII-XII intact.  Normal strength, sensation and reflexes      throughout    Labs on Admission:  Basic Metabolic Panel:  Recent Labs Lab 01/25/14 2245  NA 138  K 3.6*  CL 104  CO2 22  GLUCOSE 103*  BUN 11  CREATININE 0.64  CALCIUM 8.8   Liver Function Tests: No results found for this basename: AST, ALT, ALKPHOS, BILITOT, PROT, ALBUMIN,  in the last 168 hours No results found for this basename: LIPASE, AMYLASE,  in the last 168 hours No results found for this basename: AMMONIA,  in the last 168 hours CBC:  Recent Labs Lab 01/25/14 2245  WBC 3.8*  HGB 11.1*  HCT 33.4*  MCV 82.3  PLT 140*   Cardiac Enzymes: No results found for this basename: CKTOTAL, CKMB, CKMBINDEX, TROPONINI,  in the last 168 hours  BNP (last 3 results)  Recent Labs  01/25/14 2245  PROBNP 1240.0*   CBG: No results found for this basename: GLUCAP,  in the last 168 hours  Radiological Exams on Admission: Dg Chest 2 View  01/25/2014   CLINICAL DATA:  Chest pain and shortness of breath.  EXAM: CHEST  2 VIEW  COMPARISON:  PA and lateral chest 02/14/2009.  FINDINGS: There is extensive bilateral airspace disease. Cardiomegaly is seen. Small bilateral pleural effusions are noted. No pneumothorax.  IMPRESSION: Extensive bilateral airspace disease with cardiomegaly and small effusions is likely due to pulmonary edema. Pneumonia cannot be excluded.   Electronically Signed   By: Inge Rise M.D.   On: 01/25/2014 23:21    EKG: Independently reviewed.  Assessment/Plan Principal Problem:   Shortness of breath Active Problems:   SLE (systemic lupus erythematosus)   1. Shortness of breath - DDX is broad and includes (but not limited to) 1. Lupus Pneumonitis - Although rare, this is worrisome and must be ruled out, CT has been ordered to look for further evidence of this.  If CT inconclusive or does indeed indicate this then will need pulm consult right away.  Even with maximal therapy this is known to have a poor  prognosis.  Would likely need high dose immunosuppressives in this case and expert consultation. 2. CHF - possible, patient has already been given lasix in ED, if this is confirmed on CT chest then would continue lasix.  Am suspicious that this is not all that is going on however due to the fact that patient has inspiratory crackles rather than the expected expiratory crackles.  2d echo has also been ordered to further evaluate. 3. Infectious (likely viral) PNA - certianly possible, but given the lack of fever, cough, or other typical symptoms associated with PNA, this is lower down on the differential.    Code Status: Full Code  Family Communication: Family at bedside  Disposition Plan: Admit to inpatient   Time spent: 70 min  GARDNER, JARED M. [facility] Pager [phone]  If 7AM-7PM, please contact the day team taking care of the patient [email or web address] Password [facility] 01/26/2014, 12:28 AM

## 2014-01-26 NOTE — ED Notes (Signed)
Patient transported to CT 

## 2014-01-26 NOTE — Progress Notes (Signed)
Echocardiogram 2D Echocardiogram has been performed.  BROWN, CALEBA 01/26/2014, 10:47 AM

## 2014-01-26 NOTE — Care Management Note (Signed)
    Page 1 of 1   01/26/2014     12:52:54 PM   CARE MANAGEMENT NOTE 01/26/2014  Patient:  Christine Mcdowell, Christine Mcdowell   Account Number:  [id number]  Date Initiated:  01/26/2014  Documentation initiated by:  Dessa Phi  Subjective/Objective Assessment:   43 Y/O F ADMITTED W/SOB.TI:RWERX.     Action/Plan:   FROM HOME.HAS PCP,PHARMACY.NO HEALTH INSURANCE.USES INHALERS @ HOME.DOES NOT HAVE A NEBULIZER MACHINE.   Anticipated DC Date:  01/29/2014   Anticipated DC Plan:  [place]  CM consult      Choice offered to / List presented to:             Status of service:  In process, will continue to follow Medicare Important Message given?   (If response is "NO", the following Medicare IM given date fields will be blank) Date Medicare IM given:   Date Additional Medicare IM given:    Discharge Disposition:    Per UR Regulation:  Reviewed for med. necessity/level of care/duration of stay  If discussed at [place] of Stay Meetings, dates discussed:    Comments:  01/26/14 [facility] RN,BSN NCM [place].SHE HAS NO PROBLEM GETTING HER MEDS.IF HOME NEB MACHINE NEEDED CAN ARRANGE IF ORDERED.

## 2014-01-26 NOTE — Progress Notes (Signed)
Same Day Note. H and P from this AM reviewed. Pt presents with SOB. Noted to have elevated BNP and xray findings suggestive of CHF. No PE or PNA. On lasix and IV steroids.  Sats currently appropriate on minimal O2 support. Will continue current regimen. Follow serial cardiac enzymes. Cont supportive care.

## 2014-01-26 NOTE — ED Notes (Signed)
Receiving nurse made aware of transport delay due to patient going to CT first.

## 2014-01-27 ENCOUNTER — Inpatient Hospital Stay (HOSPITAL_COMMUNITY): Payer: 59

## 2014-01-27 DIAGNOSIS — E876 Hypokalemia: Secondary | ICD-10-CM | POA: Diagnosis present

## 2014-01-27 DIAGNOSIS — I420 Dilated cardiomyopathy: Secondary | ICD-10-CM | POA: Diagnosis present

## 2014-01-27 DIAGNOSIS — I509 Heart failure, unspecified: Secondary | ICD-10-CM

## 2014-01-27 DIAGNOSIS — I517 Cardiomegaly: Secondary | ICD-10-CM

## 2014-01-27 DIAGNOSIS — I428 Other cardiomyopathies: Secondary | ICD-10-CM

## 2014-01-27 LAB — CBC
HCT: 34.8 % — ABNORMAL LOW (ref 36.0–46.0)
Hemoglobin: 11.4 g/dL — ABNORMAL LOW (ref 12.0–15.0)
MCH: 27.4 pg (ref 26.0–34.0)
MCHC: 32.8 g/dL (ref 30.0–36.0)
MCV: 83.7 fL (ref 78.0–100.0)
Platelets: 190 10*3/uL (ref 150–400)
RBC: 4.16 MIL/uL (ref 3.87–5.11)
RDW: 12.8 % (ref 11.5–15.5)
WBC: 8.2 10*3/uL (ref 4.0–10.5)

## 2014-01-27 LAB — BASIC METABOLIC PANEL
BUN: 20 mg/dL (ref 6–23)
CO2: 25 mEq/L (ref 19–32)
Calcium: 8.7 mg/dL (ref 8.4–10.5)
Chloride: 102 mEq/L (ref 96–112)
Creatinine, Ser: 0.7 mg/dL (ref 0.50–1.10)
GFR calc Af Amer: >90 mL/min (ref >90)
GFR calc non Af Amer: >90 mL/min (ref >90)
Glucose, Bld: 105 mg/dL — ABNORMAL HIGH (ref 70–99)
Potassium: 3.1 mEq/L — ABNORMAL LOW (ref 3.7–5.3)
Sodium: 141 mEq/L (ref 137–147)

## 2014-01-27 LAB — MAGNESIUM: Magnesium: 1.8 mg/dL (ref 1.5–2.5)

## 2014-01-27 LAB — TROPONIN I: Troponin I: <0.3 ng/mL (ref <0.30)

## 2014-01-27 MED ORDER — LISINOPRIL 5 MG PO TABS
5.0000 mg | ORAL_TABLET | Freq: Every day | ORAL | Status: DC
  Administered 2014-01-27 – 2014-01-29 (×3): 5 mg via ORAL
  Filled 2014-01-27 (×3): qty 1

## 2014-01-27 MED ORDER — POTASSIUM CHLORIDE CRYS ER 20 MEQ PO TBCR
40.0000 meq | EXTENDED_RELEASE_TABLET | Freq: Every day | ORAL | Status: DC
  Administered 2014-01-28 – 2014-01-30 (×3): 40 meq via ORAL
  Filled 2014-01-27 (×4): qty 2

## 2014-01-27 MED ORDER — SODIUM CHLORIDE 0.9 % IV SOLN
INTRAVENOUS | Status: DC
  Filled 2014-01-27: qty 100

## 2014-01-27 MED ORDER — SODIUM CHLORIDE 0.9 % IV SOLN
INTRAVENOUS | Status: DC
  Filled 2014-01-27 (×4): qty 100

## 2014-01-27 MED ORDER — SODIUM CHLORIDE 0.9 % IV SOLN
INTRAVENOUS | Status: DC
  Administered 2014-01-27: 20:00:00 via INTRAVENOUS
  Filled 2014-01-27 (×3): qty 100

## 2014-01-27 MED ORDER — PREDNISONE 5 MG PO TABS: 35.0000 mg | ORAL_TABLET | Freq: Two times a day (BID) | ORAL | Status: DC

## 2014-01-27 MED ORDER — POTASSIUM CHLORIDE CRYS ER 20 MEQ PO TBCR
40.0000 meq | EXTENDED_RELEASE_TABLET | Freq: Two times a day (BID) | ORAL | Status: AC
  Administered 2014-01-27 (×2): 40 meq via ORAL
  Filled 2014-01-27 (×3): qty 2

## 2014-01-27 MED ORDER — TECHNETIUM TC 99M DIETHYLENETRIAME-PENTAACETIC ACID: 40.0000 | Freq: Once | INTRAVENOUS | Status: AC | PRN

## 2014-01-27 MED ORDER — SODIUM CHLORIDE 0.9 % IV SOLN
1000.0000 mg | Freq: Every day | INTRAVENOUS | Status: DC
  Filled 2014-01-27: qty 8

## 2014-01-27 MED ORDER — TECHNETIUM TO 99M ALBUMIN AGGREGATED
6.0000 | Freq: Once | INTRAVENOUS | Status: AC | PRN
  Administered 2014-01-27: 6 via INTRAVENOUS

## 2014-01-27 MED ORDER — NAPROXEN SODIUM 550 MG PO TABS
550.0000 mg | ORAL_TABLET | Freq: Once | ORAL | Status: AC
Start: 2014-01-27 — End: 2014-01-27
  Administered 2014-01-27: 550 mg via ORAL
  Filled 2014-01-27: qty 1

## 2014-01-27 MED ORDER — PREDNISONE 5 MG PO TABS
35.0000 mg | ORAL_TABLET | Freq: Two times a day (BID) | ORAL | Status: DC
  Administered 2014-01-30: 35 mg via ORAL
  Filled 2014-01-27 (×3): qty 1

## 2014-01-27 NOTE — Clinical Documentation Improvement (Signed)
Please specify acuity and level of CHF  Possible Clinical Conditions?  Chronic Systolic Congestive Heart Failure Chronic Diastolic Congestive Heart Failure Chronic Systolic & Diastolic Congestive Heart Failure Acute Systolic Congestive Heart Failure Acute Diastolic Congestive Heart Failure Acute Systolic & Diastolic Congestive Heart Failure Acute on Chronic Systolic Congestive Heart Failure Acute on Chronic Diastolic Congestive Heart Failure Acute on Chronic Systolic & Diastolic Congestive Heart Failure Other Condition________________________________________ Cannot Clinically Determine  Supporting Information:  Risk Factors:systemic lupus erythematosus,Asthma, HTN   Signs & Symptoms: pn 01/25/14:"She has rales in the left lower field. Labored breathing"  Diagnostics: 01/25/14     Pro B Natriuretic peptide (BNP)    1240.0 (*)   01/25/14  CHEST 2 VIEW :FINDINGS: There is extensive bilateral airspace disease. Cardiomegaly is seen. Small bilateral pleural effusions are noted. No pneumothorax. IMPRESSION: Extensive bilateral airspace disease with cardiomegaly and small effusions is likely due to pulmonary edema. Pneumonia cannot be excluded.  01/26/14   2D Echocardiogram :Left ventricle: The cavity size was moderately dilated. Wall thickness was normal. Systolic function was mildly to moderately reduced. The estimated ejection fraction was in the range of 40% to 45%. Diffuse hypokinesis. Doppler parameters are consistent with restrictive physiology, indicative of decreased left ventricular diastolic compliance and/or increased left atrial pressure.     Treatment:  Lasix in ED, continued  Lasix 40 mg IV daily Albuterol neb and inhaler  Thank You, Philippa Chester ,RN Clinical Documentation Specialist:  [place] Information Management

## 2014-01-27 NOTE — Consult Note (Signed)
Reason for Consult: CHF/chest pain Referring Physician:   [name] is an 43 y.o. female.  HPI: The patient is a 43 yo female with a history of lupus, HTN, arthritis, fibromyalgia and asthma.  She presented with worsening SOB since Friday.  She has gained about 20# in the last three months despite working out regularly with a trainer three times per week.  On Sunday was when her symptoms were the worse with CP, 8-9/10 radiating to the left axillae, diaphoresis, nausea, SOB, orthopnea, PND, LEE.  The CP appears to ease a little while leaning forward and is worse with deep breath or lying down.  She reports good BP and only takes lisinopril as needed if BP is elevated.  She was seen in the ER last August with SBP over 190 but for the most part it runs around 134/86.  On Monday she went to work and had to climb three sets of stairs.  She was extremely SOB with CP.  It took about 30 minutes for her breathing to improve.     She feels better since diuresing about 0.8L.  LEE has resolved and breathing improved.  She still has intermittent CP which is treated with toradol and morphine.    2D echo revealed an EF of 40% to 45%, diffuse hypokinesis with moderate dilation of the LV, diastolic dysfunction, trival pericardial effusion.  BNP 1240.0. Troponin negative times three.  Net fluids: -0.8L.  Ct angio was negative for PE.    Past Medical History  Diagnosis Date  . Lupus (systemic lupus erythematosus)   . Arthritis   . Hypertension   . Asthma   . Fibromyalgia     Past Surgical History  Procedure Laterality Date  . Knee surgery    . Tubal ligation    . Abdominal hysterectomy  2009     History reviewed. No pertinent family history.  Social History:  reports that she has never smoked. She has never used smokeless tobacco. She reports that she does not drink alcohol or use illicit drugs.  Allergies:  Allergies  Allergen Reactions  . Dilaudid [Hydromorphone Hcl] Nausea And  Vomiting and Other (See Comments)    Raises blood pressure   . Erythromycin Nausea And Vomiting  . Latex Hives    Medications:  Scheduled Meds: . aspirin  162 mg Oral Daily  . furosemide  40 mg Intravenous Daily  . heparin  5,000 Units Subcutaneous Q8H  . hydroxychloroquine  200 mg Oral Daily  . multivitamin with minerals  1 tablet Oral Daily  . pneumococcal 23 valent vaccine  0.5 mL Intramuscular Tomorrow-1000  . potassium chloride  40 mEq Oral BID  . [START ON 01/28/2014] potassium chloride  40 mEq Oral Daily  . predniSONE  20 mg Oral Q breakfast  . sodium chloride  3 mL Intravenous Q12H   Continuous Infusions:  PRN Meds:.albuterol, albuterol, ketorolac, morphine injection, technetium TC 14M diethylenetriame-pentaacetic acid   Results for orders placed during the hospital encounter of 01/25/14 (from the past 48 hour(s))  BASIC METABOLIC PANEL     Status: Abnormal   Collection Time    01/25/14 10:45 PM      Result Value Range   Sodium 138  137 - 147 mEq/L   Potassium 3.6 (*) 3.7 - 5.3 mEq/L   Chloride 104  96 - 112 mEq/L   CO2 22  19 - 32 mEq/L   Glucose, Bld 103 (*) 70 - 99 mg/dL   BUN 11  6 - 23 mg/dL   Creatinine, Ser 0.64  0.50 - 1.10 mg/dL   Calcium 8.8  8.4 - 10.5 mg/dL   GFR calc non Af Amer >90  >90 mL/min   GFR calc Af Amer >90  >90 mL/min   Comment: (NOTE)     The eGFR has been calculated using the CKD EPI equation.     This calculation has not been validated in all clinical situations.     eGFR's persistently <90 mL/min signify possible Chronic Kidney     Disease.  CBC     Status: Abnormal   Collection Time    01/25/14 10:45 PM      Result Value Range   WBC 3.8 (*) 4.0 - 10.5 K/uL   RBC 4.06  3.87 - 5.11 MIL/uL   Hemoglobin 11.1 (*) 12.0 - 15.0 g/dL   HCT 33.4 (*) 36.0 - 46.0 %   MCV 82.3  78.0 - 100.0 fL   MCH 27.3  26.0 - 34.0 pg   MCHC 33.2  30.0 - 36.0 g/dL   RDW 12.6  11.5 - 15.5 %   Platelets 140 (*) 150 - 400 K/uL  D-DIMER, QUANTITATIVE      Status: Abnormal   Collection Time    01/25/14 10:45 PM      Result Value Range   D-Dimer, Quant 1.97 (*) 0.00 - 0.48 ug/mL-FEU   Comment:            AT THE INHOUSE ESTABLISHED CUTOFF     VALUE OF 0.48 ug/mL FEU,     THIS ASSAY HAS BEEN DOCUMENTED     IN THE LITERATURE TO HAVE     A SENSITIVITY AND NEGATIVE     PREDICTIVE VALUE OF AT LEAST     98 TO 99%.  THE TEST RESULT     SHOULD BE CORRELATED WITH     AN ASSESSMENT OF THE CLINICAL     PROBABILITY OF DVT / VTE.  PRO B NATRIURETIC PEPTIDE     Status: Abnormal   Collection Time    01/25/14 10:45 PM      Result Value Range   Pro B Natriuretic peptide (BNP) 1240.0 (*) 0 - 125 pg/mL  POCT I-STAT TROPONIN I     Status: None   Collection Time    01/25/14 10:48 PM      Result Value Range   Troponin i, poc 0.03  0.00 - 0.08 ng/mL   Comment 3            Comment: Due to the release kinetics of cTnI,     a negative result within the first hours     of the onset of symptoms does not rule out     myocardial infarction with certainty.     If myocardial infarction is still suspected,     repeat the test at appropriate intervals.  INFLUENZA PANEL BY PCR (TYPE A & B, H1N1)     Status: None   Collection Time    01/26/14  2:12 AM      Result Value Range   Influenza A By PCR NEGATIVE  NEGATIVE   Influenza B By PCR NEGATIVE  NEGATIVE   H1N1 flu by pcr NOT DETECTED  NOT DETECTED   Comment:            The Xpert Flu assay (FDA approved for     nasal aspirates or washes and     nasopharyngeal swab specimens), is  intended as an aid in the diagnosis of     influenza and should not be used as     a sole basis for treatment.     Performed at [facility]  CBC     Status: None   Collection Time    01/26/14  4:40 AM      Result Value Range   WBC 4.6  4.0 - 10.5 K/uL   RBC 4.41  3.87 - 5.11 MIL/uL   Hemoglobin 12.2  12.0 - 15.0 g/dL   HCT 36.5  36.0 - 46.0 %   MCV 82.8  78.0 - 100.0 fL   MCH 27.7  26.0 - 34.0 pg   MCHC 33.4   30.0 - 36.0 g/dL   RDW 12.6  11.5 - 15.5 %   Platelets 183  150 - 400 K/uL   Comment: DELTA CHECK NOTED  BASIC METABOLIC PANEL     Status: Abnormal   Collection Time    01/26/14  4:40 AM      Result Value Range   Sodium 138  137 - 147 mEq/L   Potassium 3.2 (*) 3.7 - 5.3 mEq/L   Chloride 100  96 - 112 mEq/L   CO2 24  19 - 32 mEq/L   Glucose, Bld 154 (*) 70 - 99 mg/dL   BUN 13  6 - 23 mg/dL   Creatinine, Ser 0.56  0.50 - 1.10 mg/dL   Calcium 9.2  8.4 - 10.5 mg/dL   GFR calc non Af Amer >90  >90 mL/min   GFR calc Af Amer >90  >90 mL/min   Comment: (NOTE)     The eGFR has been calculated using the CKD EPI equation.     This calculation has not been validated in all clinical situations.     eGFR's persistently <90 mL/min signify possible Chronic Kidney     Disease.  TROPONIN I     Status: None   Collection Time    01/26/14  4:15 PM      Result Value Range   Troponin I <0.30  <0.30 ng/mL   Comment:            Due to the release kinetics of cTnI,     a negative result within the first hours     of the onset of symptoms does not rule out     myocardial infarction with certainty.     If myocardial infarction is still suspected,     repeat the test at appropriate intervals.  TROPONIN I     Status: None   Collection Time    01/26/14  9:55 PM      Result Value Range   Troponin I <0.30  <0.30 ng/mL   Comment:            Due to the release kinetics of cTnI,     a negative result within the first hours     of the onset of symptoms does not rule out     myocardial infarction with certainty.     If myocardial infarction is still suspected,     repeat the test at appropriate intervals.  TROPONIN I     Status: None   Collection Time    01/27/14  4:54 AM      Result Value Range   Troponin I <0.30  <0.30 ng/mL   Comment:            Due to the release kinetics of cTnI,  a negative result within the first hours     of the onset of symptoms does not rule out     myocardial  infarction with certainty.     If myocardial infarction is still suspected,     repeat the test at appropriate intervals.  BASIC METABOLIC PANEL     Status: Abnormal   Collection Time    01/27/14  4:54 AM      Result Value Range   Sodium 141  137 - 147 mEq/L   Potassium 3.1 (*) 3.7 - 5.3 mEq/L   Chloride 102  96 - 112 mEq/L   CO2 25  19 - 32 mEq/L   Glucose, Bld 105 (*) 70 - 99 mg/dL   BUN 20  6 - 23 mg/dL   Creatinine, Ser 0.70  0.50 - 1.10 mg/dL   Calcium 8.7  8.4 - 10.5 mg/dL   GFR calc non Af Amer >90  >90 mL/min   GFR calc Af Amer >90  >90 mL/min   Comment: (NOTE)     The eGFR has been calculated using the CKD EPI equation.     This calculation has not been validated in all clinical situations.     eGFR's persistently <90 mL/min signify possible Chronic Kidney     Disease.  CBC     Status: Abnormal   Collection Time    01/27/14  4:54 AM      Result Value Range   WBC 8.2  4.0 - 10.5 K/uL   RBC 4.16  3.87 - 5.11 MIL/uL   Hemoglobin 11.4 (*) 12.0 - 15.0 g/dL   HCT 34.8 (*) 36.0 - 46.0 %   MCV 83.7  78.0 - 100.0 fL   MCH 27.4  26.0 - 34.0 pg   MCHC 32.8  30.0 - 36.0 g/dL   RDW 12.8  11.5 - 15.5 %   Platelets 190  150 - 400 K/uL    Dg Chest 2 View  01/25/2014   CLINICAL DATA:  Chest pain and shortness of breath.  EXAM: CHEST  2 VIEW  COMPARISON:  PA and lateral chest 02/14/2009.  FINDINGS: There is extensive bilateral airspace disease. Cardiomegaly is seen. Small bilateral pleural effusions are noted. No pneumothorax.  IMPRESSION: Extensive bilateral airspace disease with cardiomegaly and small effusions is likely due to pulmonary edema. Pneumonia cannot be excluded.   Electronically Signed   By: Inge Rise M.D.   On: 01/25/2014 23:21   Ct Angio Chest W/cm &/or Wo Cm  01/26/2014   CLINICAL DATA:  Shortness of breath.  Elevated D-dimer.  EXAM: CT ANGIOGRAPHY CHEST WITH CONTRAST  TECHNIQUE: Multidetector CT imaging of the chest was performed using the standard protocol  during bolus administration of intravenous contrast. Multiplanar CT image reconstructions including MIPs were obtained to evaluate the vascular anatomy.  CONTRAST:  100 mL OMNIPAQUE IOHEXOL 350 MG/ML SOLN  COMPARISON:  Plain films of the chest 01/25/2014.  FINDINGS: No pulmonary embolus is identified. There is marked cardiomegaly. No pericardial effusion. No axillary, hilar or mediastinal lymphadenopathy is seen. The lungs demonstrate interlobular septal thickening and scattered ground-glass attenuation. Mild atelectasis is present in the bases. Incidentally imaged upper abdomen is unremarkable. No focal bony abnormality is identified.  Review of the MIP images confirms the above findings.  IMPRESSION: Negative for pulmonary embolus.  Findings consistent with congestive heart failure with interstitial edema, small effusions and marked cardiomegaly identified. Negative for pneumonia.   Electronically Signed   By: Inge Rise M.D.  On: 01/26/2014 01:37    Review of Systems  Constitutional: Positive for diaphoresis. Negative for fever.  HENT: Negative for congestion and sore throat.   Respiratory: Positive for shortness of breath. Negative for cough.   Cardiovascular: Positive for chest pain, orthopnea, leg swelling and PND.  Gastrointestinal: Positive for nausea. Negative for abdominal pain, blood in stool and melena.  Genitourinary: Negative for hematuria.  Musculoskeletal: Positive for joint pain.  Neurological: Negative for dizziness.  All other systems reviewed and are negative.   Blood pressure 135/89, pulse 86, temperature 97.9 F (36.6 C), temperature source Oral, resp. rate 16, height $RemoveBe'5\' 7"'cjPMisXGV$  (1.702 m), weight 153 lb 3.5 oz (69.5 kg), SpO2 100.00%. Physical Exam  Nursing note and vitals reviewed. Constitutional: She is oriented to person, place, and time. She appears well-developed and well-nourished. No distress.  HENT:  Head: Normocephalic and atraumatic.  Mouth/Throat: No  oropharyngeal exudate.  Eyes: EOM are normal. Pupils are equal, round, and reactive to light. No scleral icterus.  Neck: Normal range of motion. Neck supple. JVD present.  Cardiovascular: Normal rate, regular rhythm, S1 normal and S2 normal.   No murmur heard. Pulses:      Radial pulses are 2+ on the right side, and 2+ on the left side.       Dorsalis pedis pulses are 2+ on the right side, and 2+ on the left side.  No Carotid bruit  Respiratory: Effort normal and breath sounds normal. She has no wheezes. She has no rales.  GI: Soft. Bowel sounds are normal. She exhibits no distension. There is no tenderness.  Musculoskeletal: She exhibits no edema.  Lymphadenopathy:    She has no cervical adenopathy.  Neurological: She is alert and oriented to person, place, and time.  Skin: Skin is warm and dry.  Psychiatric: She has a normal mood and affect.    Echo Study Conclusions  - Left ventricle: The cavity size was moderately dilated. Wall thickness was normal. Systolic function was mildly to moderately reduced. The estimated ejection fraction was in the range of 40% to 45%. Diffuse hypokinesis. Doppler parameters are consistent with restrictive physiology, indicative of decreased left ventricular diastolic compliance and/or increased left atrial pressure. - Mitral valve: Mild regurgitation. - Left atrium: The atrium was mildly dilated. - Pericardium, extracardiac: A trivial pericardial effusion was identified.   Assessment/Plan: Principal Problem:   Acute combined systolic and diastolic heart failure Active Problems:   Shortness of breath   SLE (systemic lupus erythematosus)   Cardiomyopathy, dilated   Hypokalemia  Plan:  43 yo female with SLE, newly identified EF of 40-45%,diffuse hypokinesis with moderate dilation of the LV, diastolic dysfunction, trival pericardial effusion.  BNP 1240.0.   Presenting with progressive CP, SOB, orthopnea and elevated BNP.  Recommend ischemic  evaluation given the nature of SLE to accelerate atherosclerosis formation as well as reduced EF.  CP sound more like pericarditis.  EKG reveals significantly long QTc 764ms.  Previous was 423ms.  Ct angio was negative for PE.  Recommend adding ACE-I and ? Colchicine. She is on prednisone.  Potassium given.  Checking mag.  BNP in AM.    She feels better since diuresing about 0.8L.  LEE has resolved and breathing improved.  She still has intermittent CP which is treated with toradol and morphine.    Marland Kitchen      Tarri Fuller 01/27/2014, 3:51 PM    Patient examined chart reviewed discussed care with patient and entire family as well has primary service.  See separate progress note  Jenkins Rouge

## 2014-01-27 NOTE — Progress Notes (Signed)
Pt arrived via [facility] from [place] to [phone]

## 2014-01-27 NOTE — Progress Notes (Signed)
[facility] PROGRESS NOTE  Christine Mcdowell RWE:[id number] DOB: 11-21-1971 DOA: 01/25/2014 PCP: Maximino Greenland, MD  Assessment/Plan: Acute diastolic/systolic CHF exacerbation - Cardiac enzymes negative serially - BNP elevated at 1240 on admit - Improved with diuresis - Consider Cardiology consult given new onset CHF - Will follow i/o and cont diuresis for now - suspect possible relation to long hx of lupus  Lupus - Currently continued on steroids  Chest pain/SOB - Recent CTA neg for PE - Consider f/u VQ  DVT prophylaxis - Heparin subQ  Code Status: Full Family Communication: Pt in room (indicate person spoken with, relationship, and if by phone, the number) Disposition Plan: Pending  Procedures:    Antibiotics:    HPI/Subjective: Complains of sudden sob while at rest this afternoon associated with chest pains. Previous CTA neg for PE.  Objective: Filed Vitals:   01/26/14 2146 01/27/14 0500 01/27/14 0900 01/27/14 1344  BP: 134/80 123/79 124/78 135/89  Pulse: 91 83 89 86  Temp: 98 F (36.7 C) 98 F (36.7 C) 98.4 F (36.9 C) 97.9 F (36.6 C)  TempSrc: Oral Oral Oral Oral  Resp: 16 16  16   Height:      Weight:  69.5 kg (153 lb 3.5 oz)    SpO2: 99% 97% 98% 100%    Intake/Output Summary (Last 24 hours) at 01/27/14 1354 Last data filed at 01/27/14 1346  Gross per 24 hour  Intake    600 ml  Output   1800 ml  Net  -1200 ml   Filed Weights   01/25/14 2135 01/26/14 0141 01/27/14 0500  Weight: 74.844 kg (165 lb) 71.4 kg (157 lb 6.5 oz) 69.5 kg (153 lb 3.5 oz)    Exam:   General:  Awake, in nad  Cardiovascular: regular, s1, s2  Respiratory: normal resp effort, no wheezing  Abdomen: soft, nondistended   Musculoskeletal: Perfused, no clubbing   Data Reviewed: Basic Metabolic Panel:  Recent Labs Lab 01/25/14 2245 01/26/14 0440 01/27/14 0454  NA 138 138 141  K 3.6* 3.2* 3.1*  CL 104 100 102  CO2 22 24 25   GLUCOSE 103* 154* 105*  BUN  11 13 20   CREATININE 0.64 0.56 0.70  CALCIUM 8.8 9.2 8.7   Liver Function Tests: No results found for this basename: AST, ALT, ALKPHOS, BILITOT, PROT, ALBUMIN,  in the last 168 hours No results found for this basename: LIPASE, AMYLASE,  in the last 168 hours No results found for this basename: AMMONIA,  in the last 168 hours CBC:  Recent Labs Lab 01/25/14 2245 01/26/14 0440 01/27/14 0454  WBC 3.8* 4.6 8.2  HGB 11.1* 12.2 11.4*  HCT 33.4* 36.5 34.8*  MCV 82.3 82.8 83.7  PLT 140* 183 190   Cardiac Enzymes:  Recent Labs Lab 01/26/14 1615 01/26/14 2155 01/27/14 0454  TROPONINI <0.30 <0.30 <0.30   BNP (last 3 results)  Recent Labs  01/25/14 2245  PROBNP 1240.0*   CBG: No results found for this basename: GLUCAP,  in the last 168 hours  No results found for this or any previous visit (from the past 240 hour(s)).   Studies: Dg Chest 2 View  01/25/2014   CLINICAL DATA:  Chest pain and shortness of breath.  EXAM: CHEST  2 VIEW  COMPARISON:  PA and lateral chest 02/14/2009.  FINDINGS: There is extensive bilateral airspace disease. Cardiomegaly is seen. Small bilateral pleural effusions are noted. No pneumothorax.  IMPRESSION: Extensive bilateral airspace disease with cardiomegaly and small effusions is likely due to  pulmonary edema. Pneumonia cannot be excluded.   Electronically Signed   By: Inge Rise M.D.   On: 01/25/2014 23:21   Ct Angio Chest W/cm &/or Wo Cm  01/26/2014   CLINICAL DATA:  Shortness of breath.  Elevated D-dimer.  EXAM: CT ANGIOGRAPHY CHEST WITH CONTRAST  TECHNIQUE: Multidetector CT imaging of the chest was performed using the standard protocol during bolus administration of intravenous contrast. Multiplanar CT image reconstructions including MIPs were obtained to evaluate the vascular anatomy.  CONTRAST:  100 mL OMNIPAQUE IOHEXOL 350 MG/ML SOLN  COMPARISON:  Plain films of the chest 01/25/2014.  FINDINGS: No pulmonary embolus is identified. There is marked  cardiomegaly. No pericardial effusion. No axillary, hilar or mediastinal lymphadenopathy is seen. The lungs demonstrate interlobular septal thickening and scattered ground-glass attenuation. Mild atelectasis is present in the bases. Incidentally imaged upper abdomen is unremarkable. No focal bony abnormality is identified.  Review of the MIP images confirms the above findings.  IMPRESSION: Negative for pulmonary embolus.  Findings consistent with congestive heart failure with interstitial edema, small effusions and marked cardiomegaly identified. Negative for pneumonia.   Electronically Signed   By: Inge Rise M.D.   On: 01/26/2014 01:37    Scheduled Meds: . aspirin  162 mg Oral Daily  . furosemide  40 mg Intravenous Daily  . heparin  5,000 Units Subcutaneous Q8H  . hydroxychloroquine  200 mg Oral Daily  . multivitamin with minerals  1 tablet Oral Daily  . naproxen sodium  550 mg Oral Once  . pneumococcal 23 valent vaccine  0.5 mL Intramuscular Tomorrow-1000  . potassium chloride  40 mEq Oral BID  . [START ON 01/28/2014] potassium chloride  40 mEq Oral Daily  . predniSONE  20 mg Oral Q breakfast  . sodium chloride  3 mL Intravenous Q12H   Continuous Infusions:   Principal Problem:   Shortness of breath Active Problems:   SLE (systemic lupus erythematosus)  Time spent: 56min  [facility], [facility] [facility] Pager [phone]. If 7PM-7AM, please contact night-coverage at [email or web address], password [facility] 01/27/2014, 1:54 PM  LOS: 2 days

## 2014-01-27 NOTE — Progress Notes (Signed)
Patient ID: Christine Mcdowell, female   DOB: 11-24-1971, 43 y.o.   MRN: [id number] See full note from [place];  43 yo with long standing aggressive Lupus.  Followed by Dr Ouida Sills.  Prednisone dose increased to $RemoveBefo'20mg'nXLUWngzpuG$  daily about 3 months ago due to "crisis" and flairs which Usually involve swelling and joint pain.  Cr has been ok but she describes swelling of her kidneys.  Has never had heart involvement that she knows of but never had echo Has had dyspnea before from "asthma" that she uses an inhaler for.  Marked dsypnea for last 3 days presenting with elevated BNP and echo with EF 40-45% and restrictive Filling pressures.  ECG tachycardic with long QT but no signs of pericarditis.  Chest pain atypical.  Reviewed CT done for elevated d dimer and r/o PE  No significant pericardial Effusion and pericardium itself not thickened.    My concern is for lupus myocarditis.  Alternative diagnosis would be idiopathic DCM or restrictive cardiomyopathy with endomyocardial fibrosis.    Plan:  High dose steroids and consult Rheum for other Rx Already on plaquenil She indicates being on other meds in past that have not helped Cardiac MRI in am  Transfer to [facility] tonight to facilitate I have talked with tech (Char) and placed order for study.  Continue diuretics  Depending on results of MRI may need right and left heart cath on Monday to r/o CAD (less likely) and assess for restrictive physiology After 3 more days of Rx.    Check ESR CPK's   Jenkins Rouge

## 2014-01-28 ENCOUNTER — Inpatient Hospital Stay (HOSPITAL_COMMUNITY): Payer: 59

## 2014-01-28 DIAGNOSIS — I319 Disease of pericardium, unspecified: Secondary | ICD-10-CM

## 2014-01-28 MED ORDER — DIAZEPAM 5 MG PO TABS
5.0000 mg | ORAL_TABLET | Freq: Four times a day (QID) | ORAL | Status: DC | PRN
  Administered 2014-01-28: 5 mg via ORAL
  Filled 2014-01-28: qty 1

## 2014-01-28 MED ORDER — GADOBENATE DIMEGLUMINE 529 MG/ML IV SOLN
23.0000 mL | Freq: Once | INTRAVENOUS | Status: AC
  Administered 2014-01-28: 23 mL via INTRAVENOUS

## 2014-01-28 MED ORDER — FUROSEMIDE 10 MG/ML IJ SOLN
40.0000 mg | Freq: Two times a day (BID) | INTRAMUSCULAR | Status: DC
  Administered 2014-01-28 – 2014-01-29 (×2): 40 mg via INTRAVENOUS
  Filled 2014-01-28 (×3): qty 4

## 2014-01-28 MED ORDER — SODIUM CHLORIDE 0.9 % IV SOLN
INTRAVENOUS | Status: DC
  Filled 2014-01-28: qty 100

## 2014-01-28 MED ORDER — SODIUM CHLORIDE 0.9 % IV SOLN
INTRAVENOUS | Status: AC
  Administered 2014-01-29 (×2): via INTRAVENOUS
  Filled 2014-01-28 (×3): qty 100

## 2014-01-28 NOTE — Progress Notes (Signed)
[facility] PROGRESS NOTE  Christine Mcdowell DGL:[id number] DOB: [birth year] DOA: 01/25/2014 PCP: Christine Greenland, MD  Brief narrative: Christine Mcdowell is a 43 y.o. female who presented to the ED with c/o increasing SOB over 2-3 days which is new for her.  She had some cold symptoms, but this became severe. She attempted home nebs, but eventually these did not help. Has h/o Lupus as well as Asthma. Cough is non-productive, no fever, symptoms worse with exertion better at rest.  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  She received one dose of solumedrol on 2/3.  Patient reports also getting IV steroids this AM, but cannot find the record for this.  - Consider Rheum consult in AM, will need to consider solumedrol 1mg /kg if she is confirmed to have Lupus myocarditis.   Chest pain/SOB  - She notes CP is resolved, however, she has acute episodes of SOB while at rest.  For the most part at rest, however, she is breathing comfortably.  She cannot lie flat - Recent CTA neg for PE  - VQ done shows low probability for PE - Spoke briefly with pulmonary on call and they noted that her sx are likely from her new onset CHF - O2 as needed   DVT prophylaxis  - Heparin subQ   Code Status: Full  Family Communication: Pt in room Disposition Plan: Pending above work  up  Consultants:  Cardiology  Procedures/Studies: Nm Pulmonary Perf And Vent  01/27/2014   CLINICAL DATA:  Shortness of Breath  EXAM: NUCLEAR MEDICINE VENTILATION - PERFUSION LUNG SCAN  Views: Anterior, posterior, left lateral, right lateral, RPO, LPO, RAO, LAO -ventilation and perfusion  Radionuclide: Technetium 69m DTPA -ventilation; Technetium 20m macroaggregated albumin-perfusion  Dose:  40.0 mCi-ventilation; 6.0 mCi-perfusion  Route of administration: Inhalation-ventilation; intravenous- perfusion  COMPARISON:  Chest radiograph January 25, 2014 and chest CT January 26, 2014  FINDINGS: Radiotracer uptake on the ventilation study is homogeneous and symmetric bilaterally.  Radiotracer uptake on the perfusion study is homogeneous and symmetric bilaterally.  There is no appreciable ventilation/perfusion mismatch.  IMPRESSION: There are no appreciable ventilation perfusion abnormalities. Very low probability of pulmonary embolus.   Electronically Signed   By: Lowella Grip M.D.   On: 01/27/2014 16:04      Code Status: Full Family Communication: Pt and family at bedside Disposition Plan: Deferred until further work up done  HPI/Subjective: Patient with some improvement in breathing overnight.  She is unable to lay flat, but did well at a 30% angle.  She had some issues with nursing overnight which upset her concerning her bath.  She continues to have intermittent episodes of SOB.   Objective: Filed Vitals:   01/27/14 1344 01/28/14 0201 01/28/14 0510 01/28/14 0911  BP: 135/89 125/91 132/90 128/88  Pulse: 86 85 82   Temp: 97.9 F (36.6 C) 98 F (36.7 C)  98.6 F (37 C)   TempSrc: Oral Oral Oral   Resp: 16 18 18    Height:      Weight:   153 lb 8 oz (69.627 kg)   SpO2: 100% 100% 95%     Intake/Output Summary (Last 24 hours) at 01/28/14 1149 Last data filed at 01/28/14 1113  Gross per 24 hour  Intake    958 ml  Output   1875 ml  Net   -917 ml    Exam:   General:  Pt is  alert, follows commands appropriately, not in acute distress  Cardiovascular: Regular rate and rhythm, S1/S2  Respiratory: Clear to auscultation bilaterally, some course breath sounds throughout, wheezing has resolved  Abdomen: Soft, non tender, non distended, bowel sounds present, no guarding  Extremities: No edema, pulses DP and PT palpable bilaterally  Neuro: Grossly nonfocal  Skin: multiple dark patches under breasts and on back  Data Reviewed: Basic Metabolic Panel:  Recent Labs Lab 01/25/14 2245 01/26/14 0440 01/27/14 0454 01/27/14 1801  NA 138 138 141  --   K 3.6* 3.2* 3.1*  --   CL 104 100 102  --   CO2 22 24 25   --   GLUCOSE 103* 154* 105*  --   BUN 11 13 20   --   CREATININE 0.64 0.56 0.70  --   CALCIUM 8.8 9.2 8.7  --   MG  --   --   --  1.8   CBC:  Recent Labs Lab 01/25/14 2245 01/26/14 0440 01/27/14 0454  WBC 3.8* 4.6 8.2  HGB 11.1* 12.2 11.4*  HCT 33.4* 36.5 34.8*  MCV 82.3 82.8 83.7  PLT 140* 183 190   Cardiac Enzymes:  Recent Labs Lab 01/26/14 1615 01/26/14 2155 01/27/14 0454  TROPONINI <0.30 <0.30 <0.30     Scheduled Meds: . aspirin  162 mg Oral Daily  . furosemide  40 mg Intravenous Q12H  . heparin  5,000 Units Subcutaneous Q8H  . hydroxychloroquine  200 mg Oral Daily  . lisinopril  5 mg Oral Daily  . multivitamin with minerals  1 tablet Oral Daily  . pneumococcal 23 valent vaccine  0.5 mL Intramuscular Tomorrow-1000  . potassium chloride  40 mEq Oral Daily  . [START ON 01/30/2014] predniSONE  35 mg Oral BID WC  . sodium chloride 0.9 % 100 mL with methylPREDNISolone sodium succinate (SOLU-MEDROL) 1,000 mg infusion   Intravenous Q24H  . sodium chloride  3 mL Intravenous Q12H   Continuous Infusions:    Gilles Chiquito, MD  [facility] Pager [phone]  If 7PM-7AM, please contact night-coverage [email or web address] Password [email or web address] 01/28/2014, 11:49 AM   LOS: 3 days

## 2014-01-28 NOTE — Progress Notes (Signed)
Pt ask to take a shower during the night and refers she will use her own close. Pt oriented that we will talk to MD on respect taking a shower and for now we can help to help her get clean with wash cloth. Central monitoring call to remove tele monitor while cleaning. Pt wants to get clean with her daughter help. Pt remove her IV fluids tubing by her self, IV tubing discarded to prevent any contamination.

## 2014-01-28 NOTE — Progress Notes (Signed)
Patient ID: Christine Mcdowell, female   DOB: 02-13-1971, 43 y.o.   MRN: [id number]    Subjective:  Episodes of diaphoresis and dyspnea continue    Objective:  Filed Vitals:   01/27/14 1344 01/28/14 0201 01/28/14 0510 01/28/14 0911  BP: 135/89 125/91 132/90 128/88  Pulse: 86 85 82   Temp: 97.9 F (36.6 C) 98 F (36.7 C) 98.6 F (37 C)   TempSrc: Oral Oral Oral   Resp: 16 18 18    Height:      Weight:   153 lb 8 oz (69.627 kg)   SpO2: 100% 100% 95%     Intake/Output from previous day:  Intake/Output Summary (Last 24 hours) at 01/28/14 1043 Last data filed at 01/28/14 0900  Gross per 24 hour  Intake    958 ml  Output   1650 ml  Net   -692 ml    Physical Exam: Affect appropriate Young black female  HEENT: normal Neck supple with no adenopathy JVP normal no bruits no thyromegaly Lungs some restricted motion no active wheezing  Heart:  S1/S2 no murmur, no rub, gallop or click PMI normal Abdomen: benighn, BS positve, no tenderness, no AAA no bruit.  No HSM or HJR Distal pulses intact with no bruits No edema Neuro non-focal Skin warm and dry No muscular weakness   Lab Results: Basic Metabolic Panel:  Recent Labs  01/26/14 0440 01/27/14 0454 01/27/14 1801  NA 138 141  --   K 3.2* 3.1*  --   CL 100 102  --   CO2 24 25  --   GLUCOSE 154* 105*  --   BUN 13 20  --   CREATININE 0.56 0.70  --   CALCIUM 9.2 8.7  --   MG  --   --  1.8   CBC:  Recent Labs  01/26/14 0440 01/27/14 0454  WBC 4.6 8.2  HGB 12.2 11.4*  HCT 36.5 34.8*  MCV 82.8 83.7  PLT 183 190   Cardiac Enzymes:  Recent Labs  01/26/14 1615 01/26/14 2155 01/27/14 0454  TROPONINI <0.30 <0.30 <0.30   D-Dimer:  Recent Labs  01/25/14 2245  DDIMER 1.97*    Imaging: Nm Pulmonary Perf And Vent  01/27/2014   CLINICAL DATA:  Shortness of Breath  EXAM: NUCLEAR MEDICINE VENTILATION - PERFUSION LUNG SCAN  Views: Anterior, posterior, left lateral, right lateral, RPO, LPO, RAO, LAO  -ventilation and perfusion  Radionuclide: Technetium 75m DTPA -ventilation; Technetium 25m macroaggregated albumin-perfusion  Dose:  40.0 mCi-ventilation; 6.0 mCi-perfusion  Route of administration: Inhalation-ventilation; intravenous- perfusion  COMPARISON:  Chest radiograph January 25, 2014 and chest CT January 26, 2014  FINDINGS: Radiotracer uptake on the ventilation study is homogeneous and symmetric bilaterally.  Radiotracer uptake on the perfusion study is homogeneous and symmetric bilaterally.  There is no appreciable ventilation/perfusion mismatch.  IMPRESSION: There are no appreciable ventilation perfusion abnormalities. Very low probability of pulmonary embolus.   Electronically Signed   By: Lowella Grip M.D.   On: 01/27/2014 16:04    Cardiac Studies:  ECG:  ST nonspecific ST/T wave changes no acute pericarditis changes   Telemetry:  SR/ST no arrhythmia   Echo:  EF 40-45% mild MR restrictive filling pattern  Medications:   . aspirin  162 mg Oral Daily  . furosemide  40 mg Intravenous Daily  . heparin  5,000 Units Subcutaneous Q8H  . hydroxychloroquine  200 mg Oral Daily  . lisinopril  5 mg Oral Daily  . multivitamin with minerals  1 tablet Oral Daily  . pneumococcal 23 valent vaccine  0.5 mL Intramuscular Tomorrow-1000  . potassium chloride  40 mEq Oral Daily  . [START ON 01/30/2014] predniSONE  35 mg Oral BID WC  . sodium chloride 0.9 % 100 mL with methylPREDNISolone sodium succinate (SOLU-MEDROL) 1,000 mg infusion   Intravenous Q24H  . sodium chloride  3 mL Intravenous Q12H       Assessment/Plan:  CHF:  ? Of lupus myocarditis.  Continue diuresis increase 40 bid iv  3 days of pulse steroids 1gr daily then 1mg /kg in divided doses.  Try to get Dr Tonette Bihari input on additional Rx  Ace started yesterday Would Like to use coreg but not in acute setting with history of asthma.  Cardiac MRI today will try to use [place] to look at myocarditis for edema,  capillary leak and scar  T2 weighted, early and late T1 gadolinium sequences  Jenkins Rouge 01/28/2014, 10:43 AM

## 2014-01-29 ENCOUNTER — Inpatient Hospital Stay (HOSPITAL_COMMUNITY): Payer: 59

## 2014-01-29 LAB — CBC
HCT: 36.1 % (ref 36.0–46.0)
Hemoglobin: 12.2 g/dL (ref 12.0–15.0)
MCH: 28.1 pg (ref 26.0–34.0)
MCHC: 33.8 g/dL (ref 30.0–36.0)
MCV: 83.2 fL (ref 78.0–100.0)
Platelets: 208 10*3/uL (ref 150–400)
RBC: 4.34 MIL/uL (ref 3.87–5.11)
RDW: 12.6 % (ref 11.5–15.5)
WBC: 12.6 10*3/uL — ABNORMAL HIGH (ref 4.0–10.5)

## 2014-01-29 LAB — BASIC METABOLIC PANEL
BUN: 19 mg/dL (ref 6–23)
CO2: 23 mEq/L (ref 19–32)
Calcium: 8.7 mg/dL (ref 8.4–10.5)
Chloride: 104 mEq/L (ref 96–112)
Creatinine, Ser: 0.62 mg/dL (ref 0.50–1.10)
GFR calc Af Amer: >90 mL/min (ref >90)
GFR calc non Af Amer: >90 mL/min (ref >90)
Glucose, Bld: 124 mg/dL — ABNORMAL HIGH (ref 70–99)
Potassium: 4.4 mEq/L (ref 3.7–5.3)
Sodium: 143 mEq/L (ref 137–147)

## 2014-01-29 MED ORDER — GUAIFENESIN ER 600 MG PO TB12
600.0000 mg | ORAL_TABLET | Freq: Two times a day (BID) | ORAL | Status: DC
  Administered 2014-01-29 – 2014-01-30 (×3): 600 mg via ORAL
  Filled 2014-01-29 (×4): qty 1

## 2014-01-29 MED ORDER — FUROSEMIDE 20 MG PO TABS
20.0000 mg | ORAL_TABLET | Freq: Two times a day (BID) | ORAL | Status: DC
Start: 2014-01-29 — End: 2014-01-30
  Administered 2014-01-29 – 2014-01-30 (×3): 20 mg via ORAL
  Filled 2014-01-29 (×5): qty 1

## 2014-01-29 MED ORDER — SALINE SPRAY 0.65 % NA SOLN
1.0000 | NASAL | Status: DC | PRN
  Filled 2014-01-29: qty 44

## 2014-01-29 MED ORDER — CETIRIZINE HCL 5 MG/5ML PO SYRP
5.0000 mg | ORAL_SOLUTION | Freq: Every day | ORAL | Status: DC
  Administered 2014-01-29 – 2014-01-30 (×2): 5 mg via ORAL
  Filled 2014-01-29 (×2): qty 5

## 2014-01-29 MED ORDER — LISINOPRIL 10 MG PO TABS
10.0000 mg | ORAL_TABLET | Freq: Every day | ORAL | Status: DC
  Administered 2014-01-30: 10 mg via ORAL
  Filled 2014-01-29: qty 1

## 2014-01-29 MED ORDER — CARVEDILOL 3.125 MG PO TABS
3.1250 mg | ORAL_TABLET | Freq: Two times a day (BID) | ORAL | Status: DC
  Administered 2014-01-29 – 2014-01-30 (×2): 3.125 mg via ORAL
  Filled 2014-01-29 (×5): qty 1

## 2014-01-29 NOTE — Progress Notes (Signed)
Pt. Alert and stable during the night. Resting in bed quietly. No s/s of distress noted. Pt. C/o shart pains in her left chest/lung area. PRN pain medication administered and effective. Family at bedside. Call light within reach. RN will continue to monitor pt. For changes in condition. Wall, Katherine Roan

## 2014-01-29 NOTE — Progress Notes (Signed)
Patient ID: Christine Mcdowell, female   DOB: [birth year], 43 y.o.   MRN: [id number]    Subjective:  Feels much better except for sinus congestion and mild epistaxis this am    Objective:  Filed Vitals:   01/28/14 1637 01/28/14 2016 01/29/14 0624 01/29/14 0920  BP: 140/81 129/82 125/80 130/80  Pulse:  88 84   Temp:  97.9 F (36.6 C) 98 F (36.7 C)   TempSrc:  Oral Oral   Resp: 18 19 16    Height:      Weight:   150 lb 5.7 oz (68.2 kg)   SpO2:  100% 100%     Intake/Output from previous day:  Intake/Output Summary (Last 24 hours) at 01/29/14 0940 Last data filed at 01/29/14 3154  Gross per 24 hour  Intake    920 ml  Output   2325 ml  Net  -1405 ml    Physical Exam: Affect appropriate Young black female  HEENT: normal Neck supple with no adenopathy JVP normal no bruits no thyromegaly Lungs some restricted motion no active wheezing  Heart:  S1/S2 no murmur, no rub, gallop or click PMI normal Abdomen: benighn, BS positve, no tenderness, no AAA no bruit.  No HSM or HJR Distal pulses intact with no bruits No edema Neuro non-focal Skin warm and dry No muscular weakness   Lab Results: Basic Metabolic Panel:  Recent Labs  01/27/14 0454 01/27/14 1801 01/29/14 0335  NA 141  --  143  K 3.1*  --  4.4  CL 102  --  104  CO2 25  --  23  GLUCOSE 105*  --  124*  BUN 20  --  19  CREATININE 0.70  --  0.62  CALCIUM 8.7  --  8.7  MG  --  1.8  --    CBC:  Recent Labs  01/27/14 0454 01/29/14 0335  WBC 8.2 12.6*  HGB 11.4* 12.2  HCT 34.8* 36.1  MCV 83.7 83.2  PLT 190 208   Cardiac Enzymes:  Recent Labs  01/26/14 1615 01/26/14 2155 01/27/14 0454  TROPONINI <0.30 <0.30 <0.30   D-Dimer: No results found for this basename: DDIMER,  in the last 72 hours  Imaging: Nm Pulmonary Perf And Vent  01/27/2014   CLINICAL DATA:  Shortness of Breath  EXAM: NUCLEAR MEDICINE VENTILATION - PERFUSION LUNG SCAN  Views: Anterior, posterior, left lateral, right lateral, RPO,  LPO, RAO, LAO -ventilation and perfusion  Radionuclide: Technetium 75m DTPA -ventilation; Technetium 96m macroaggregated albumin-perfusion  Dose:  40.0 mCi-ventilation; 6.0 mCi-perfusion  Route of administration: Inhalation-ventilation; intravenous- perfusion  COMPARISON:  Chest radiograph January 25, 2014 and chest CT January 26, 2014  FINDINGS: Radiotracer uptake on the ventilation study is homogeneous and symmetric bilaterally.  Radiotracer uptake on the perfusion study is homogeneous and symmetric bilaterally.  There is no appreciable ventilation/perfusion mismatch.  IMPRESSION: There are no appreciable ventilation perfusion abnormalities. Very low probability of pulmonary embolus.   Electronically Signed   By: Lowella Grip M.D.   On: 01/27/2014 16:04    Cardiac Studies:  ECG:  ST nonspecific ST/T wave changes no acute pericarditis changes   Telemetry:  SR/ST no arrhythmia   Echo:  EF 40-45% mild MR restrictive filling pattern  Medications:   . aspirin  162 mg Oral Daily  . carvedilol  3.125 mg Oral BID WC  . furosemide  20 mg Oral BID  . heparin  5,000 Units Subcutaneous Q8H  . hydroxychloroquine  200 mg Oral Daily  . [  START ON 01/30/2014] lisinopril  10 mg Oral Daily  . multivitamin with minerals  1 tablet Oral Daily  . pneumococcal 23 valent vaccine  0.5 mL Intramuscular Tomorrow-1000  . potassium chloride  40 mEq Oral Daily  . [START ON 01/30/2014] predniSONE  35 mg Oral BID WC  . sodium chloride 0.9 % 100 mL with methylPREDNISolone sodium succinate (SOLU-MEDROL) 1,000 mg infusion   Intravenous Q24H  . sodium chloride  3 mL Intravenous Q12H       Assessment/Plan:  CHF:  Reviewed MRI final report to be done latter today.  T2 weighted images show mild edema of anterior and anterior lateral walls No hyperenhancement or scar tissue.  Completing day 3 high dose pulsed steroids.  Exam improved.  Will need 35 mg bid oral Prednisone starting tomorrow.  Would have Dr Ouida Sills handle  taper.  Will adjust CHF meds  Lisinopril:  Increase 10 mg Lasix change to 20 PO bid Add low dose coreg 3.125 bid.  She has mild asthma and is not wheezing  No need for right and left cath at this time.    Possible d/c in am   Needs sinus films and further w/u for allergies and chronic sinus issues  Jenkins Rouge 01/29/2014, 9:40 AM

## 2014-01-29 NOTE — Progress Notes (Signed)
[facility] PROGRESS NOTE  Devin Foskey HKV:[id number] DOB: [birth year] DOA: 01/25/2014 PCP: Maximino Greenland, MD  Brief narrative:  Christine Mcdowell is a 43 y.o. female who presented to the ED with c/o increasing SOB over 2-3 days which is new for her. She had some cold symptoms, but this became severe. She attempted home nebs, but eventually these did not help. Has h/o Lupus as well as Asthma. Cough is non-productive, no fever, symptoms worse with exertion better at rest.   Assessment/Plan   Acute diastolic/systolic CHF exacerbation  - Cardiac enzymes have been negative  - BNP elevated at 1240 on admission  - She is improved today, oxygen needs at RA - Cardiac MRI done yesterday - as noted in Dr. Kyla Balzarine note - She is 3L negative - Dr. 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  She will need close follow up with her Rheumatologist and Dr. Kyla Balzarine office at discharge.   Chronic sinusitis with congestion, Post nasal drip - She complains of chronic symptoms for years, previously controlled somewhat on zyrtec, related to allergies - She did have a nosebleed this AM - Sinus image ordered by Cardiology - Start Zyrtec, guaifenisin and saline nasal spray today - WBC elevated, likely due to steroids, do not suspect acute infection.   Lupus  - she is on prednisone currently - She is on prednisone 70 mg daily, split into two doses.  - Follow closely with outpatient Rheumatologist, seems to be improving today.   Chest pain/SOB  - SOB is improved today - Recent CTA neg for PE  - VQ done shows low probability for PE   - O2 as needed   DVT prophylaxis  - Heparin subQ   Code Status: Full  Family Communication: Pt in room  Disposition Plan: Pending above  work up, she would like to go home tomorrow  Consultants:  Cardiology  Procedures/Studies: Nm Pulmonary Perf And Vent  01/27/2014   CLINICAL DATA:  Shortness of Breath  EXAM: NUCLEAR MEDICINE VENTILATION - PERFUSION LUNG SCAN  Views: Anterior, posterior, left lateral, right lateral, RPO, LPO, RAO, LAO -ventilation and perfusion  Radionuclide: Technetium 82m DTPA -ventilation; Technetium 55m macroaggregated albumin-perfusion  Dose:  40.0 mCi-ventilation; 6.0 mCi-perfusion  Route of administration: Inhalation-ventilation; intravenous- perfusion  COMPARISON:  Chest radiograph January 25, 2014 and chest CT January 26, 2014  FINDINGS: Radiotracer uptake on the ventilation study is homogeneous and symmetric bilaterally.  Radiotracer uptake on the perfusion study is homogeneous and symmetric bilaterally.  There is no appreciable ventilation/perfusion mismatch.  IMPRESSION: There are no appreciable ventilation perfusion abnormalities. Very low probability of pulmonary embolus.   Electronically Signed   By: Lowella Grip M.D.   On: 01/27/2014 16:04        HPI/Subjective: No events overnight.  She is having decreased episodic SOB.  She was sitting on side of bed comfortably this morning.  She does note a history of chronic sinusitis, congestion, post nasal drip.  She had a nosebleed this morning.   Objective: Filed Vitals:   01/28/14 1637 01/28/14 2016 01/29/14 0624 01/29/14 0920  BP: 140/81 129/82 125/80 130/80  Pulse:  88 84   Temp:  97.9 F (36.6 C) 98 F (36.7 C)   TempSrc:  Oral Oral   Resp:  18 19 16    Height:      Weight:   150 lb 5.7 oz (68.2 kg)   SpO2:  100% 100%     Intake/Output Summary (Last 24 hours) at 01/29/14 0948 Last data filed at 01/29/14 9604  Gross per 24 hour  Intake    920 ml  Output   2325 ml  Net  -1405 ml    Exam:   General:  Pt is alert, follows commands appropriately, not in acute distress, no apparent blood in nares  Cardiovascular: Regular rate and  rhythm, S1/S2, no murmurs noted  Respiratory: Clear to auscultation bilaterally  Abdomen: Soft, non tender, non distended  Extremities: No edema  Neuro: Grossly nonfocal    Data Reviewed: Basic Metabolic Panel:  Recent Labs Lab 01/25/14 2245 01/26/14 0440 01/27/14 0454 01/27/14 1801 01/29/14 0335  NA 138 138 141  --  143  K 3.6* 3.2* 3.1*  --  4.4  CL 104 100 102  --  104  CO2 22 24 25   --  23  GLUCOSE 103* 154* 105*  --  124*  BUN 11 13 20   --  19  CREATININE 0.64 0.56 0.70  --  0.62  CALCIUM 8.8 9.2 8.7  --  8.7  MG  --   --   --  1.8  --    CBC:  Recent Labs Lab 01/25/14 2245 01/26/14 0440 01/27/14 0454 01/29/14 0335  WBC 3.8* 4.6 8.2 12.6*  HGB 11.1* 12.2 11.4* 12.2  HCT 33.4* 36.5 34.8* 36.1  MCV 82.3 82.8 83.7 83.2  PLT 140* 183 190 208   Cardiac Enzymes:  Recent Labs Lab 01/26/14 1615 01/26/14 2155 01/27/14 0454  TROPONINI <0.30 <0.30 <0.30     Scheduled Meds: . aspirin  162 mg Oral Daily  . carvedilol  3.125 mg Oral BID WC  . cetirizine HCl  5 mg Oral Daily  . furosemide  20 mg Oral BID  . guaiFENesin  600 mg Oral BID  . heparin  5,000 Units Subcutaneous Q8H  . hydroxychloroquine  200 mg Oral Daily  . [START ON 01/30/2014] lisinopril  10 mg Oral Daily  . multivitamin with minerals  1 tablet Oral Daily  . pneumococcal 23 valent vaccine  0.5 mL Intramuscular Tomorrow-1000  . potassium chloride  40 mEq Oral Daily  . [START ON 01/30/2014] predniSONE  35 mg Oral BID WC  . sodium chloride 0.9 % 100 mL with methylPREDNISolone sodium succinate (SOLU-MEDROL) 1,000 mg infusion   Intravenous Q24H  . sodium chloride  3 mL Intravenous Q12H   Continuous Infusions:    Gilles Chiquito, MD  [facility] Pager [phone]  If 7PM-7AM, please contact night-coverage [email or web address] Password [email or web address] 01/29/2014, 9:48 AM   LOS: 4 days

## 2014-01-30 DIAGNOSIS — J189 Pneumonia, unspecified organism: Secondary | ICD-10-CM

## 2014-01-30 DIAGNOSIS — I5041 Acute combined systolic (congestive) and diastolic (congestive) heart failure: Secondary | ICD-10-CM

## 2014-01-30 DIAGNOSIS — E876 Hypokalemia: Secondary | ICD-10-CM

## 2014-01-30 MED ORDER — CARVEDILOL 6.25 MG PO TABS: 6.2500 mg | ORAL_TABLET | Freq: Two times a day (BID) | ORAL | Status: DC

## 2014-01-30 MED ORDER — FUROSEMIDE 40 MG PO TABS: 40.0000 mg | ORAL_TABLET | Freq: Every day | ORAL | Status: DC

## 2014-01-30 MED ORDER — PREDNISONE 5 MG PO TABS: 35.0000 mg | ORAL_TABLET | Freq: Two times a day (BID) | ORAL | Status: DC

## 2014-01-30 MED ORDER — POTASSIUM CHLORIDE CRYS ER 20 MEQ PO TBCR: 40.0000 meq | EXTENDED_RELEASE_TABLET | Freq: Every day | ORAL | Status: DC

## 2014-01-30 MED ORDER — LISINOPRIL 10 MG PO TABS: 10.0000 mg | ORAL_TABLET | Freq: Every day | ORAL | Status: DC

## 2014-01-30 MED ORDER — HYDROXYCHLOROQUINE SULFATE 200 MG PO TABS: 200.0000 mg | ORAL_TABLET | Freq: Every day | ORAL | Status: DC

## 2014-01-30 MED ORDER — CARVEDILOL 6.25 MG PO TABS
6.2500 mg | ORAL_TABLET | Freq: Two times a day (BID) | ORAL | Status: DC
  Filled 2014-01-30 (×2): qty 1

## 2014-01-30 NOTE — Discharge Summary (Signed)
Physician Discharge Summary  Christine Mcdowell [id number] DOB: March 28, 1971 DOA: 01/25/2014  PCP: Maximino Greenland, MD  Admit date: 01/25/2014 Discharge date: 01/30/2014  Time spent: 35 minutes  Recommendations for Outpatient Follow-up:  1. Follow up with Cardiology in 1-2 week 2. Follow up with Rheumatologist in 2 weeks.  Discharge Diagnoses:  Principal Problem:   Acute combined systolic and diastolic heart failure Active Problems:   Shortness of breath   SLE (systemic lupus erythematosus)   Cardiomyopathy, dilated   Hypokalemia   Discharge Condition: stable  Diet recommendation: heart healthy  Filed Weights   01/28/14 0510 01/29/14 0624 01/30/14 0438  Weight: 69.627 kg (153 lb 8 oz) 68.2 kg (150 lb 5.7 oz) 69.627 kg (153 lb 8 oz)    History of present illness:  43 y.o. female who presents to the ED with c/o increasing SOB over the last couple of days. Did have some issues with sinus congestion and thought she was coming down with a cold, but this turned into severe dyspnea. She is not usually short of breath. Home nebulizer treatments have helped somewhat initially but today her nebs didn't help much. Has h/o Lupus as well as Asthma. 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  - now resolved.  Lupus   - she is on prednisone currently  - She is on prednisone 70 mg daily, split into two doses.  - Follow closely with outpatient Rheumatologist.  Chest pain/SOB  - Resovled ? Due heart failure. - Recent CTA neg for PE  - VQ done shows low probability for PE   DVT prophylaxis  - Heparin subQ    Procedures: Cardia MRI V/Q scan negative for PE. CXR 2.3.2015: Extensive bilateral airspace disease with cardiomegaly and small effusions is likely due to pulmonary edema CT angio: Negative for pulmonary embolus  Consultations:  cardiology  Discharge Exam: Filed Vitals:   01/30/14 0438  BP: 141/81  Pulse: 90  Temp: 98 F (36.7 C)  Resp: 16    General: A&O x3 Cardiovascular: RRR Respiratory: good air movement CTA B/L  Discharge Instructions      Discharge Orders   Future Orders Complete By Expires   Diet - low sodium heart healthy  As directed    Increase activity slowly  As directed        Medication List    STOP taking these medications       naproxen sodium 220 MG tablet  Commonly known as:  ANAPROX      TAKE these medications       albuterol (2.5 MG/3ML) 0.083% nebulizer solution  Commonly known as:  PROVENTIL  Take 2.5 mg by nebulization every 6 (six) hours as needed for wheezing or shortness of breath.     albuterol 108 (90 BASE) MCG/ACT inhaler  Commonly known as:  PROVENTIL HFA;VENTOLIN  HFA  Inhale 2 puffs into the lungs every 6 (six) hours as needed for wheezing or shortness of breath.     aspirin 81 MG chewable tablet  Chew 162 mg by mouth daily.     carvedilol 6.25 MG tablet  Commonly known as:  COREG  Take 1 tablet (6.25 mg total) by mouth 2 (two) times daily with a meal.     furosemide 40 MG tablet  Commonly known as:  LASIX  Take 1 tablet (40 mg total) by mouth daily.  Start taking on:  01/31/2014     hydrocortisone cream 1 %  Apply 1 application topically 2 (two) times daily as needed for itching.     hydroxychloroquine 200 MG tablet   Commonly known as:  PLAQUENIL  Take 200 mg by mouth daily.     lisinopril 10 MG tablet  Commonly known as:  PRINIVIL,ZESTRIL  Take 1 tablet (10 mg total) by mouth daily.     multivitamin with minerals Tabs tablet  Take 1 tablet by mouth daily.     potassium chloride SA 20 MEQ tablet  Commonly known as:  K-DUR,KLOR-CON  Take 2 tablets (40 mEq total) by mouth daily.     predniSONE 5 MG tablet  Commonly known as:  DELTASONE  Take 7 tablets (35 mg total) by mouth 2 (two) times daily with a meal.       Allergies  Allergen Reactions  . Dilaudid [Hydromorphone Hcl] Nausea And Vomiting and Other (See Comments)    Raises blood pressure   . Erythromycin Nausea And Vomiting  . Latex Hives   Follow-up Information   Follow up with Tobie Lords, MD In 2 weeks. (hospital follow up)    Specialty:  Rheumatology   Contact information:   [address] [place] [address] [phone]       Follow up with Jenkins Rouge, MD In 1 week. (hospital follow up)    Specialty:  Cardiology   Contact information:   [address] [place] [address] [phone]        The results of significant diagnostics from this hospitalization (including imaging, microbiology, ancillary and laboratory) are listed below for reference.    Significant Diagnostic Studies: Dg Sinuses Complete  01/29/2014   CLINICAL DATA:  Sinus congestion  EXAM: PARANASAL SINUSES - COMPLETE 3 + VIEW  COMPARISON:  None.  FINDINGS: The paranasal sinus are aerated. There is no evidence of sinus opacification air-fluid levels or mucosal thickening. No significant bone abnormalities are seen.  IMPRESSION: Negative.   Electronically Signed   By: Margaree Mackintosh M.D.   On: 01/29/2014 13:04   Dg Chest 2 View  01/25/2014   CLINICAL DATA:  Chest pain and shortness of breath.  EXAM: CHEST  2 VIEW  COMPARISON:  PA and lateral chest 02/14/2009.  FINDINGS: There is extensive bilateral airspace disease.  Cardiomegaly is seen. Small bilateral pleural effusions are noted. No pneumothorax.  IMPRESSION: Extensive bilateral airspace disease with cardiomegaly and small effusions is likely due to pulmonary edema. Pneumonia cannot be excluded.   Electronically Signed   By: Inge Rise M.D.   On: 01/25/2014 23:21   Ct Angio Chest W/cm &/or Wo Cm  01/26/2014   CLINICAL DATA:  Shortness of breath.  Elevated D-dimer.  EXAM: CT ANGIOGRAPHY CHEST WITH CONTRAST  TECHNIQUE: Multidetector CT imaging of the chest was performed using the standard protocol during bolus administration of intravenous contrast. Multiplanar CT image reconstructions including MIPs were  obtained to evaluate the vascular anatomy.  CONTRAST:  100 mL OMNIPAQUE IOHEXOL 350 MG/ML SOLN  COMPARISON:  Plain films of the chest 01/25/2014.  FINDINGS: No pulmonary embolus is identified. There is marked cardiomegaly. No pericardial effusion. No axillary, hilar or mediastinal lymphadenopathy is seen. The lungs demonstrate interlobular septal thickening and scattered ground-glass attenuation. Mild atelectasis is present in the bases. Incidentally imaged upper abdomen is unremarkable. No focal bony abnormality is identified.  Review of the MIP images confirms the above findings.  IMPRESSION: Negative for pulmonary embolus.  Findings consistent with congestive heart failure with interstitial edema, small effusions and marked cardiomegaly identified. Negative for pneumonia.   Electronically Signed   By: Inge Rise M.D.   On: 01/26/2014 01:37   Nm Pulmonary Perf And Vent  01/27/2014   CLINICAL DATA:  Shortness of Breath  EXAM: NUCLEAR MEDICINE VENTILATION - PERFUSION LUNG SCAN  Views: Anterior, posterior, left lateral, right lateral, RPO, LPO, RAO, LAO -ventilation and perfusion  Radionuclide: Technetium 57m DTPA -ventilation; Technetium 34m macroaggregated albumin-perfusion  Dose:  40.0 mCi-ventilation; 6.0 mCi-perfusion  Route of administration:  Inhalation-ventilation; intravenous- perfusion  COMPARISON:  Chest radiograph January 25, 2014 and chest CT January 26, 2014  FINDINGS: Radiotracer uptake on the ventilation study is homogeneous and symmetric bilaterally.  Radiotracer uptake on the perfusion study is homogeneous and symmetric bilaterally.  There is no appreciable ventilation/perfusion mismatch.  IMPRESSION: There are no appreciable ventilation perfusion abnormalities. Very low probability of pulmonary embolus.   Electronically Signed   By: Lowella Grip M.D.   On: 01/27/2014 16:04   Mr Card Morphology Wo/w Cm  01/29/2014   CLINICAL DATA:  Cardiomyopathy?  Lupus Myocarditis  EXAM: CARDIAC MRI  TECHNIQUE: The patient was scanned on a 1.5 Tesla GE magnet. A dedicated cardiac coil was used. Functional imaging was done using Fiesta sequences. 2,3, and 4 chamber views were done to assess for RWMA's. Modified Simpson's rule using a short axis stack was used to calculate an ejection fraction on a dedicated work Conservation officer, nature. The patient received 23 cc of Multihance. Both early and late T1 weighted inversion recovery sequences were done to assess for capillary leak and scar. T2 weighted sequences were also done to assess for edema.  CONTRAST:  23 cc Multihance  FINDINGS: There was mild LAE. There was moderate LVE with diffuse hypokinesis. The RA and RV were normal. There was no ASD or VSD. There was a small circumferential pericardial effusion. There was MR. The AV and TV appeared normal Ascending aortic root was normal. The quantitative EF was 46% (EDV 226, ESV 122 SV 104) T2 weighted images showed evidence for possible edema in the mid anterior wall apex and inferolateral walls. There was no early or late gadolinium uptake suggesting that there was no evidence of capillary leak or scar tissue.  IMPRESSION: 1) Moderate LVE with diffuse hypokinesis EF 46%  2) T2 weighted images suggest edema in the anterior wall apex and inferolateral  walls. This is consistent with lupus myocarditis  2) No early or late gadolinium uptake suggesting no permanent scar tissue or fibrosis  3) LAE  4) MR 5) Small circumferential pericardial effusion  Jenkins Rouge   Electronically Signed   By: Jenkins Rouge M.D.   On: 01/29/2014 12:58    Microbiology: No results found for this or any previous visit (from the past 240 hour(s)).   Labs: Basic Metabolic Panel:  Recent Labs Lab 01/25/14 2245 01/26/14 0440 01/27/14 0454 01/27/14 1801 01/29/14  0335  NA 138 138 141  --  143  K 3.6* 3.2* 3.1*  --  4.4  CL 104 100 102  --  104  CO2 22 24 25   --  23  GLUCOSE 103* 154* 105*  --  124*  BUN 11 13 20   --  19  CREATININE 0.64 0.56 0.70  --  0.62  CALCIUM 8.8 9.2 8.7  --  8.7  MG  --   --   --  1.8  --    Liver Function Tests: No results found for this basename: AST, ALT, ALKPHOS, BILITOT, PROT, ALBUMIN,  in the last 168 hours No results found for this basename: LIPASE, AMYLASE,  in the last 168 hours No results found for this basename: AMMONIA,  in the last 168 hours CBC:  Recent Labs Lab 01/25/14 2245 01/26/14 0440 01/27/14 0454 01/29/14 0335  WBC 3.8* 4.6 8.2 12.6*  HGB 11.1* 12.2 11.4* 12.2  HCT 33.4* 36.5 34.8* 36.1  MCV 82.3 82.8 83.7 83.2  PLT 140* 183 190 208   Cardiac Enzymes:  Recent Labs Lab 01/26/14 1615 01/26/14 2155 01/27/14 0454  TROPONINI <0.30 <0.30 <0.30   BNP: BNP (last 3 results)  Recent Labs  01/25/14 2245  PROBNP 1240.0*   CBG: No results found for this basename: GLUCAP,  in the last 168 hours     Signed:  [name]  [facility] 01/30/2014, 12:51 PM

## 2014-01-30 NOTE — Progress Notes (Signed)
Patient ID: Christine Mcdowell, female   DOB: Mar 25, 1971, 43 y.o.   MRN: [id number]    Subjective:  Breathing much better no edema    Objective:  Filed Vitals:   01/29/14 0624 01/29/14 0920 01/29/14 2042 01/30/14 0438  BP: 125/80 130/80 131/83 141/81  Pulse: 84  87 90  Temp: 98 F (36.7 C)  97.9 F (36.6 C) 98 F (36.7 C)  TempSrc: Oral  Oral Oral  Resp: 16  18 16   Height:      Weight: 150 lb 5.7 oz (68.2 kg)   153 lb 8 oz (69.627 kg)  SpO2: 100%  100% 96%    Intake/Output from previous day:  Intake/Output Summary (Last 24 hours) at 01/30/14 1113 Last data filed at 01/30/14 1011  Gross per 24 hour  Intake    120 ml  Output   1400 ml  Net  -1280 ml    Physical Exam: Affect appropriate Young black female  HEENT: normal Neck supple with no adenopathy JVP normal no bruits no thyromegaly Lungs some restricted motion no active wheezing  Heart:  S1/S2 no murmur, no rub, gallop or click PMI normal Abdomen: benighn, BS positve, no tenderness, no AAA no bruit.  No HSM or HJR Distal pulses intact with no bruits No edema Neuro non-focal Skin warm and dry No muscular weakness   Lab Results: Basic Metabolic Panel:  Recent Labs  01/27/14 1801 01/29/14 0335  NA  --  143  K  --  4.4  CL  --  104  CO2  --  23  GLUCOSE  --  124*  BUN  --  19  CREATININE  --  0.62  CALCIUM  --  8.7  MG 1.8  --    CBC:  Recent Labs  01/29/14 0335  WBC 12.6*  HGB 12.2  HCT 36.1  MCV 83.2  PLT 208   Cardiac Enzymes: No results found for this basename: CKTOTAL, CKMB, CKMBINDEX, TROPONINI,  in the last 72 hours D-Dimer: No results found for this basename: DDIMER,  in the last 72 hours  Imaging: Dg Sinuses Complete  01/29/2014   CLINICAL DATA:  Sinus congestion  EXAM: PARANASAL SINUSES - COMPLETE 3 + VIEW  COMPARISON:  None.  FINDINGS: The paranasal sinus are aerated. There is no evidence of sinus opacification air-fluid levels or mucosal thickening. No significant bone  abnormalities are seen.  IMPRESSION: Negative.   Electronically Signed   By: Margaree Mackintosh M.D.   On: 01/29/2014 13:04   Mr Card Morphology Wo/w Cm  01/29/2014   CLINICAL DATA:  Cardiomyopathy?  Lupus Myocarditis  EXAM: CARDIAC MRI  TECHNIQUE: The patient was scanned on a 1.5 Tesla GE magnet. A dedicated cardiac coil was used. Functional imaging was done using Fiesta sequences. 2,3, and 4 chamber views were done to assess for RWMA's. Modified Simpson's rule using a short axis stack was used to calculate an ejection fraction on a dedicated work Conservation officer, nature. The patient received 23 cc of Multihance. Both early and late T1 weighted inversion recovery sequences were done to assess for capillary leak and scar. T2 weighted sequences were also done to assess for edema.  CONTRAST:  23 cc Multihance  FINDINGS: There was mild LAE. There was moderate LVE with diffuse hypokinesis. The RA and RV were normal. There was no ASD or VSD. There was a small circumferential pericardial effusion. There was MR. The AV and TV appeared normal Ascending aortic root was normal. The quantitative EF  was 46% (EDV 226, ESV 122 SV 104) T2 weighted images showed evidence for possible edema in the mid anterior wall apex and inferolateral walls. There was no early or late gadolinium uptake suggesting that there was no evidence of capillary leak or scar tissue.  IMPRESSION: 1) Moderate LVE with diffuse hypokinesis EF 46%  2) T2 weighted images suggest edema in the anterior wall apex and inferolateral walls. This is consistent with lupus myocarditis  2) No early or late gadolinium uptake suggesting no permanent scar tissue or fibrosis  3) LAE  4) MR 5) Small circumferential pericardial effusion  Jenkins Rouge   Electronically Signed   By: Jenkins Rouge M.D.   On: 01/29/2014 12:58    Cardiac Studies:  ECG:  ST nonspecific ST/T wave changes no acute pericarditis changes   Telemetry:  SR/ST no arrhythmia   Echo:  EF 40-45% mild  MR restrictive filling pattern  Medications:   . aspirin  162 mg Oral Daily  . carvedilol  6.25 mg Oral BID WC  . cetirizine HCl  5 mg Oral Daily  . [START ON 01/31/2014] furosemide  40 mg Oral Daily  . guaiFENesin  600 mg Oral BID  . heparin  5,000 Units Subcutaneous Q8H  . hydroxychloroquine  200 mg Oral Daily  . lisinopril  10 mg Oral Daily  . multivitamin with minerals  1 tablet Oral Daily  . pneumococcal 23 valent vaccine  0.5 mL Intramuscular Tomorrow-1000  . potassium chloride  40 mEq Oral Daily  . predniSONE  35 mg Oral BID WC  . 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  I will see her back in office in 6-8 weeks  Should have BMET in 3 weeks    Jenkins Rouge 01/30/2014, 11:13 AM

## 2014-01-30 NOTE — Progress Notes (Signed)
   CARE MANAGEMENT NOTE 01/30/2014  Patient:  Christine Mcdowell, Christine Mcdowell   Account Number:  [id number]  Date Initiated:  01/26/2014  Documentation initiated by:  [facility]  Subjective/Objective Assessment:   43 Y/O F ADMITTED W/SOB.TD:VVOHY.     Action/Plan:   FROM HOME.HAS PCP,PHARMACY.NO HEALTH INSURANCE.USES INHALERS @ HOME.DOES NOT HAVE A NEBULIZER MACHINE.   Anticipated DC Date:  01/29/2014   Anticipated DC Plan:  [place]  CM consult  [place] Program      Choice offered to / List presented to:             Status of service:  Completed, signed off Medicare Important Message given?   (If response is "NO", the following Medicare IM given date fields will be blank) Date Medicare IM given:   Date Additional Medicare IM given:    Discharge Disposition:  HOME/SELF CARE  Per UR Regulation:  Reviewed for med. necessity/level of care/duration of stay  If discussed at [place] of Stay Meetings, dates discussed:    Comments:  01/30/14 13:40 CM met with pt in room and gave pt [employer] [place] intake number handout for her to call Monday to pursue her [employer] as pt stated she was unsure if she had insurance coming on the 15th of the month.  Pt also given [place] handout to call Monday to secure a PCP.  Pt given a MATCH letter and CM eplained the [facility] program parameters and gave her a list of participating pharmacies.  Pt verbalized understanding and states she will follow through with pursuing both insurance and a PCP.  No other CM needs were communicated. Christine Mcdowell, BSN, CM [phone].  01/26/14 Christine MAHABIR RN,BSN NCM [phone] PROVIDED PATIENT Christine Mcdowell INFO,& WEBSITE.SHE HAS NO PROBLEM GETTING HER MEDS.IF HOME NEB MACHINE NEEDED CAN ARRANGE IF ORDERED.

## 2014-01-30 NOTE — Progress Notes (Signed)
Patient ID: Christine Mcdowell, female   DOB: 09-May-1971, 43 y.o.   MRN: [id number]     Subjective:  The patient feels significantly better and is able to walk without getting SOB. She is ready to go home.   Objective:  Filed Vitals:   01/29/14 9509 01/29/14 0920 01/29/14 2042 01/30/14 0438  BP: 125/80 130/80 131/83 141/81  Pulse: 84  87 90  Temp: 98 F (36.7 C)  97.9 F (36.6 C) 98 F (36.7 C)  TempSrc: Oral  Oral Oral  Resp: 16  18 16   Height:      Weight: 150 lb 5.7 oz (68.2 kg)   153 lb 8 oz (69.627 kg)  SpO2: 100%  100% 96%    Intake/Output from previous day:  Intake/Output Summary (Last 24 hours) at 01/30/14 1034 Last data filed at 01/30/14 1011  Gross per 24 hour  Intake    120 ml  Output   1800 ml  Net  -1680 ml    Physical Exam: Affect appropriate Young black female  HEENT: normal Neck supple with no adenopathy JVP normal no bruits no thyromegaly Lungs some restricted motion no active wheezing  Heart:  S1/S2 no murmur, no rub, gallop or click PMI normal Abdomen: benighn, BS positve, no tenderness, no AAA no bruit.  No HSM or HJR Distal pulses intact with no bruits No edema Neuro non-focal Skin warm and dry No muscular weakness   Lab Results: Basic Metabolic Panel:  Recent Labs  01/27/14 1801 01/29/14 0335  NA  --  143  K  --  4.4  CL  --  104  CO2  --  23  GLUCOSE  --  124*  BUN  --  19  CREATININE  --  0.62  CALCIUM  --  8.7  MG 1.8  --    CBC:  Recent Labs  01/29/14 0335  WBC 12.6*  HGB 12.2  HCT 36.1  MCV 83.2  PLT 208   Mr Card Morphology Wo/w Cm  01/29/2014   CLINICAL DATA:  Cardiomyopathy?  Lupus Myocarditis   IMPRESSION: 1) Moderate LVE with diffuse hypokinesis EF 46%  2) T2 weighted images suggest edema in the anterior wall apex and inferolateral walls. This is consistent with lupus myocarditis  2) No early or late gadolinium uptake suggesting no permanent scar tissue or fibrosis  3) LAE  4) MR 5) Small circumferential  pericardial effusion  Jenkins Rouge   Electronically Signed   By: Jenkins Rouge M.D.   On: 01/29/2014 12:58   Cardiac Studies:  ECG:  ST nonspecific ST/T wave changes no acute pericarditis changes  Telemetry:  SR/ST no arrhythmia   Echo:  EF 40-45% mild MR restrictive filling pattern  Medications:   . aspirin  162 mg Oral Daily  . carvedilol  3.125 mg Oral BID WC  . cetirizine HCl  5 mg Oral Daily  . furosemide  20 mg Oral BID  . guaiFENesin  600 mg Oral BID  . heparin  5,000 Units Subcutaneous Q8H  . hydroxychloroquine  200 mg Oral Daily  . lisinopril  10 mg Oral Daily  . multivitamin with minerals  1 tablet Oral Daily  . pneumococcal 23 valent vaccine  0.5 mL Intramuscular Tomorrow-1000  . potassium chloride  40 mEq Oral Daily  . predniSONE  35 mg Oral BID WC  . sodium chloride  3 mL Intravenous Q12H       Assessment/Plan:   CHF:  MRI showed possible myocarditis with edema  in the basal and mid anterior and anterolateral walls. No hyperenhancement or scar tissue.  Completing day 3 high dose pulsed steroids. Started on 35 mg bid oral today.  Would have Dr Ouida Sills handle taper.  Will adjust CHF meds  Lisinopril:  Continue 10 mg daily Lasix: 40 mg PO daily Increase Coreg to 6.25 mg PO BID.  She has mild asthma and is not wheezing  No need for right and left cath at this time.    She will be dischrged today and follow in our clinic.   Christine Mcdowell, H 01/30/2014, 10:34 AM

## 2014-01-30 NOTE — Discharge Instructions (Signed)
Christine Mcdowell was admitted to the Hospital on 01/25/2014 and Discharged on Discharge Date 01/30/2014 and should be excused from work/school   for 5  days starting 01/25/2014 , may return to work/school without any restrictions.  Call [name] MD, [place] [facility] [phone] with questions.  [name] M.D on 01/30/2014,at 1:16 PM  [facility] Office  [phone]

## 2014-02-22 ENCOUNTER — Other Ambulatory Visit: Payer: Self-pay | Admitting: *Deleted

## 2014-02-22 ENCOUNTER — Telehealth: Payer: Self-pay | Admitting: Cardiovascular Disease

## 2014-02-22 MED ORDER — POTASSIUM CHLORIDE CRYS ER 20 MEQ PO TBCR: 40.0000 meq | EXTENDED_RELEASE_TABLET | Freq: Every day | ORAL | Status: DC

## 2014-02-22 MED ORDER — FUROSEMIDE 40 MG PO TABS: 40.0000 mg | ORAL_TABLET | Freq: Every day | ORAL | Status: DC

## 2014-02-22 NOTE — Telephone Encounter (Signed)
SPOKE WITH PT  AS STATED IN MESSAGE  WEIGHT  HAS  DROPPED  SOME  BUT IS NOT  BACK  TO  BASELINE  CURRENTLY  IS TAKING  LASIX  40 MG  EVERY DAY  AND  KCL  20 MEQ  2 TABS  EVERY DAY,   DID  HOWEVER,  TAKE  AN  EXTRA LASIX  YESTERDAY  PER  ON CALL SERVICE   PT  IS  COMPLAINING  WITH   SOME EDEMA  TO HANDS  AND  WITH   3  FLIGHTS  OF  STAIRS TO GET  TO OFFICE  TODAY  HAD  SOME  SOB  WILL  WEIGH  ONCE   GETS HOME   INSTRUCTED PT   IF  HAS  SIGNIFICANT  WEIGHT   GAIN AFTER  HOURS  TO  GET  ER  FOR  [place]  HAS APPT ON    02-24-14  AT  4:00 PM   WILL FORWARD  TO  DR Johnsie Cancel FOR   REVIEW .Adonis Housekeeper

## 2014-02-22 NOTE — Telephone Encounter (Signed)
New Message/Update  Pt called states she had an episode last night. She went from 160 lbs to 166 lbs within 24 hours// Dr. On call advised her to take another lasix to remove fluid.Marland Kitchenwhen she took the lasix her wt went down to 164.2 SOB.

## 2014-02-24 ENCOUNTER — Encounter: Payer: Self-pay | Admitting: Cardiovascular Disease

## 2014-02-24 ENCOUNTER — Ambulatory Visit (INDEPENDENT_AMBULATORY_CARE_PROVIDER_SITE_OTHER): Payer: 59 | Admitting: Cardiovascular Disease

## 2014-02-24 ENCOUNTER — Encounter: Payer: Self-pay | Admitting: General Surgery

## 2014-02-24 VITALS — BP 148/88 | HR 74 | Ht 67.0 in | Wt 165.0 lb

## 2014-02-24 DIAGNOSIS — R0602 Shortness of breath: Secondary | ICD-10-CM

## 2014-02-24 DIAGNOSIS — M329 Systemic lupus erythematosus, unspecified: Secondary | ICD-10-CM

## 2014-02-24 DIAGNOSIS — I514 Myocarditis, unspecified: Secondary | ICD-10-CM

## 2014-02-24 DIAGNOSIS — I428 Other cardiomyopathies: Secondary | ICD-10-CM

## 2014-02-24 DIAGNOSIS — E876 Hypokalemia: Secondary | ICD-10-CM

## 2014-02-24 DIAGNOSIS — I420 Dilated cardiomyopathy: Secondary | ICD-10-CM

## 2014-02-24 MED ORDER — TORSEMIDE 10 MG PO TABS: 10.0000 mg | ORAL_TABLET | ORAL | Status: DC | PRN

## 2014-02-24 NOTE — Patient Instructions (Signed)
Your physician has recommended you make the following change in your medication: 1.Start Demadex 10 MG 1 tablet as needed for swelling   Your physician recommends that you go to the lab today for a BMET and BNP  Your physician has requested that you have an echocardiogram. Echocardiography is a painless test that uses sound waves to create images of your heart. It provides your doctor with information about the size and shape of your heart and how well your heart's chambers and valves are working. This procedure takes approximately one hour. There are no restrictions for this procedure. (Schedule Next week if possible)  Your physician recommends that you schedule a follow-up appointment first available after ECHO

## 2014-02-24 NOTE — Progress Notes (Signed)
Patient ID: Christine Mcdowell, female   DOB: [birth year], 43 y.o.   MRN: [id number] The patient is a 43 yo female with a history of lupus, HTN, arthritis, fibromyalgia and asthma. She presented to [facility] hospital with worsening SOB 2/5 . She has gained about 20# in the last three months despite working out regularly with a trainer three times per week. On Sunday was when her symptoms were the worse with CP, 8-9/10 radiating to the left axillae, diaphoresis, nausea, SOB, orthopnea, PND, LEE. The CP appears to ease a little while leaning forward and is worse with deep breath or lying down. She reports good BP and only takes lisinopril as needed if BP is elevated. She was seen in the ER last August with SBP over 190 but for the most part it runs around 134/86. On Monday she went to work and had to climb three sets of stairs. She was extremely SOB with CP. It took about 30 minutes for her breathing to improve.  She feels better since diuresing about 0.8L. LEE has resolved and breathing improved. She still has intermittent CP which is treated with toradol and morphine.  2D echo revealed an EF of 40% to 45%, diffuse hypokinesis with moderate dilation of the LV, diastolic dysfunction, trival pericardial effusion. BNP 1240.0. Troponin negative times three. Net fluids: -0.8L. Ct angio was negative for PE.   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  01/29/14    IMPRESSION: 1) Moderate LVE with diffuse hypokinesis EF 46%  2) T2 weighted images suggest edema in the anterior wall apex and inferolateral walls. This is consistent with lupus myocarditis  2) No early or late gadolinium uptake suggesting no permanent scar tissue or fibrosis  3) LAE  4) MR 5) Small circumferential pericardial effusion  Rx with high dose steroids 1gram q 3 days then 1mg /kg  On d/c followed by Dr Ouida Sills and still on 50mg  daily.  Last week has had some weight gain not responsive to lasix.   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  No chest pain Sleeping ok.  No PND orthopnea  Some swelling in hands and feet        ROS: Denies fever, malais, weight loss, blurry vision, decreased visual acuity, cough, sputum, SOB, hemoptysis, pleuritic pain, palpitaitons, heartburn, abdominal pain, melena, lower extremity edema, claudication, or rash.  All other systems reviewed and negative  General: Affect appropriate Healthy:  appears stated age 89: normal Neck supple with no adenopathy JVP normal no bruits no thyromegaly Lungs clear with no wheezing and good diaphragmatic motion Heart:  S1/S2 no murmur, no rub, gallop or click PMI normal Abdomen: benighn, BS positve, no tenderness, no AAA no bruit.  No HSM or HJR Distal pulses intact with no bruits No edema Neuro non-focal Skin warm and dry No muscular weakness   Current Outpatient Prescriptions  Medication Sig Dispense Refill  . albuterol (PROVENTIL HFA;VENTOLIN HFA) 108 (90 BASE) MCG/ACT inhaler Inhale 2 puffs into the lungs every 6 (six) hours as needed for wheezing or shortness of breath.      Marland Kitchen albuterol (PROVENTIL) (2.5 MG/3ML) 0.083% nebulizer solution Take 2.5 mg by nebulization every 6 (six) hours as needed for wheezing or shortness of breath.      Marland Kitchen aspirin 81 MG chewable tablet Chew 162 mg by mouth daily.      . carvedilol (COREG) 6.25 MG tablet Take 1 tablet (6.25 mg total) by mouth 2 (two) times  daily with a meal.  60 tablet  0  . furosemide (LASIX) 40 MG tablet Take 1 tablet (40 mg total) by mouth daily.  30 tablet  0  . hydrocortisone cream 1 % Apply 1 application topically 2 (two) times daily as needed for itching.      . hydroxychloroquine (PLAQUENIL) 200 MG tablet Take 1 tablet (200 mg total) by mouth daily.  15 tablet  0  . lisinopril (PRINIVIL,ZESTRIL) 10 MG tablet Take 1 tablet (10 mg total) by mouth daily.  30 tablet  0  . Multiple Vitamin (MULTIVITAMIN WITH MINERALS) TABS Take 1 tablet by mouth daily.       . naproxen (NAPROSYN) 250 MG tablet Take 250 mg by mouth 2 (two) times daily with a meal.      . potassium chloride SA (K-DUR,KLOR-CON) 20 MEQ tablet Take 2 tablets (40 mEq total) by mouth daily.  60 tablet  6  . predniSONE (DELTASONE) 5 MG tablet Take 7 tablets (35 mg total) by mouth 2 (two) times daily with a meal.  90 tablet  0   No current facility-administered medications for this visit.    Allergies  Dilaudid; Erythromycin; and Latex  Electrocardiogram:  SR rate 93  Poor R wave progression no acute pericarditis changes   Assessment and Plan

## 2014-02-24 NOTE — Assessment & Plan Note (Addendum)
Add sliding scale demedex to lasix for weight gain.  Still tapering from steroids.  Check BMET and BNP today  Likely increase ACE Next visit Continue current dose of beta blocker.  F/U echo next week for EF and make sure no effusion  Ok to see her trainer at home For stretching and PT but not aerobic activity or treadmill as she had acute inflammation of myocardium and is at risk for arrhythmias Limit activity for 4 more weeks pending echo results.  Ok to have intercourse with familiar boyfriend  Hadicap parking plackard written For and given to patient

## 2014-02-24 NOTE — Assessment & Plan Note (Signed)
BMET Continue to supplement as needed based on diuretics

## 2014-02-24 NOTE — Assessment & Plan Note (Signed)
F/U with Dr Ouida Sills Continue steroids and plaquinil

## 2014-02-25 LAB — BASIC METABOLIC PANEL
BUN: 15 mg/dL (ref 6–23)
CO2: 32 mEq/L (ref 19–32)
Calcium: 8.9 mg/dL (ref 8.4–10.5)
Chloride: 100 mEq/L (ref 96–112)
Creatinine, Ser: 0.8 mg/dL (ref 0.4–1.2)
GFR: 102.65 mL/min (ref >60.00)
Glucose, Bld: 105 mg/dL — ABNORMAL HIGH (ref 70–99)
Potassium: 4.1 mEq/L (ref 3.5–5.1)
Sodium: 138 mEq/L (ref 135–145)

## 2014-02-25 LAB — BRAIN NATRIURETIC PEPTIDE: Pro B Natriuretic peptide (BNP): 44 pg/mL (ref 0.0–100.0)

## 2014-02-28 ENCOUNTER — Other Ambulatory Visit: Payer: Self-pay | Admitting: *Deleted

## 2014-02-28 MED ORDER — POTASSIUM CHLORIDE CRYS ER 20 MEQ PO TBCR: 40.0000 meq | EXTENDED_RELEASE_TABLET | Freq: Every day | ORAL | Status: DC

## 2014-02-28 MED ORDER — CARVEDILOL 6.25 MG PO TABS: 6.2500 mg | ORAL_TABLET | Freq: Two times a day (BID) | ORAL | Status: DC

## 2014-02-28 MED ORDER — LISINOPRIL 10 MG PO TABS: 10.0000 mg | ORAL_TABLET | Freq: Every day | ORAL | Status: DC

## 2014-02-28 NOTE — Telephone Encounter (Signed)
Patient called in for 3 refills

## 2014-03-02 ENCOUNTER — Ambulatory Visit (HOSPITAL_COMMUNITY)
Admission: RE | Admit: 2014-03-02 | Discharge: 2014-03-02 | Disposition: A | Discharge status: Home / Self Care | Payer: 59 | Source: Ambulatory Visit | Attending: Cardiovascular Disease | Admitting: Cardiovascular Disease

## 2014-03-02 DIAGNOSIS — I514 Myocarditis, unspecified: Secondary | ICD-10-CM | POA: Diagnosis not present

## 2014-03-02 DIAGNOSIS — I059 Rheumatic mitral valve disease, unspecified: Secondary | ICD-10-CM

## 2014-03-02 NOTE — Progress Notes (Signed)
  Echocardiogram 2D Echocardiogram has been performed.  Christine Mcdowell 03/02/2014, 3:43 PM

## 2014-03-09 NOTE — Telephone Encounter (Signed)
PT  NOTIFIED OF  ECHO   RESULTS PER  DR NISHAN  HEART  STILL ENLARGED   EF  BETTER  CONT  WITH  SAME  MEDS./CY  ALSO PT COMPLAINED  WITH  SOB   DID NOT  TAKE  EXTRA  DIURETIC  ENCOURAGED  MAY  TAKE   AS  NEEDED  FOR  WEIGHT  GAIN   OR  HAVING  SYMPTOMS  ALSO  C/O  MILD  CHEST  PAIN   NOTED   IN MIDDLE OF  CHEST   PT  WILL CONT TO MONITOR  HAS  APPT  NEXT  WEEK   WITH DR  Johnsie Cancel   WILL  SEE AT THAT TIME .Adonis Housekeeper

## 2014-03-09 NOTE — Telephone Encounter (Signed)
New message    Calling for test results  

## 2014-03-17 ENCOUNTER — Ambulatory Visit (INDEPENDENT_AMBULATORY_CARE_PROVIDER_SITE_OTHER): Payer: 59 | Admitting: Cardiovascular Disease

## 2014-03-17 ENCOUNTER — Encounter: Payer: Self-pay | Admitting: *Deleted

## 2014-03-17 VITALS — BP 112/78 | HR 88 | Ht 66.0 in | Wt 162.0 lb

## 2014-03-17 DIAGNOSIS — M329 Systemic lupus erythematosus, unspecified: Secondary | ICD-10-CM

## 2014-03-17 DIAGNOSIS — I420 Dilated cardiomyopathy: Secondary | ICD-10-CM

## 2014-03-17 DIAGNOSIS — E876 Hypokalemia: Secondary | ICD-10-CM

## 2014-03-17 DIAGNOSIS — I428 Other cardiomyopathies: Secondary | ICD-10-CM

## 2014-03-17 NOTE — Assessment & Plan Note (Signed)
F/U Dr Ouida Sills  On plaquenil and prednisone  I think heart is pretty well compensated Encouraged her to get back to her PT therapy

## 2014-03-17 NOTE — Patient Instructions (Signed)
Your physician wants you to follow-up in:  6 MONTHS WITH DR NISHAN  You will receive a reminder letter in the mail two months in advance. If you don't receive a letter, please call our office to schedule the follow-up appointment. Your physician recommends that you continue on your current medications as directed. Please refer to the Current Medication list given to you today. 

## 2014-03-17 NOTE — Progress Notes (Signed)
Patient ID: Christine Mcdowell, female   DOB: 1971/06/03, 43 y.o.   MRN: [id number] The patient is a 43 yo female with a history of lupus, HTN, arthritis, fibromyalgia and asthma. She presented to [facility] hospital with worsening SOB 2/5 . She has gained about 20# in the last three months despite working out regularly with a trainer three times per week. On Sunday was when her symptoms were the worse with CP, 8-9/10 radiating to the left axillae, diaphoresis, nausea, SOB, orthopnea, PND, LEE. The CP appears to ease a little while leaning forward and is worse with deep breath or lying down. She reports good BP and only takes lisinopril as needed if BP is elevated. She was seen in the ER last August with SBP over 190 but for the most part it runs around 134/86. On Monday she went to work and had to climb three sets of stairs. She was extremely SOB with CP. It took about 30 minutes for her breathing to improve.  She feels better since diuresing about 0.8L. LEE has resolved and breathing improved. She still has intermittent CP which is treated with toradol and morphine.  2D echo revealed an EF of 40% to 45%, diffuse hypokinesis with moderate dilation of the LV, diastolic dysfunction, trival pericardial effusion. BNP 1240.0. Troponin negative times three. Net fluids: -0.8L. Ct angio was negative for PE.  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 01/29/14   IMPRESSION: 1) Moderate LVE with diffuse hypokinesis EF 46%  2) T2 weighted images suggest edema in the anterior wall apex and inferolateral walls. This is consistent with lupus myocarditis  2) No early or late gadolinium uptake suggesting no permanent scar tissue or fibrosis  3) LAE  4) MR 5) Small circumferential pericardial effusion   Rx with high dose steroids 1gram q 3 days then 1mg /kg On d/c followed by Dr Ouida Sills and still on 50mg  daily. Last week has had some weight gain not responsive to lasix.  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 No PND orthopnea Some swelling in hands and feet   Lots of issues with lupus Joints very sore Face more swollen  Demedex works well for weight gain.  Some night cramps in hands and legs     ROS: Denies fever, malais, weight loss, blurry vision, decreased visual acuity, cough, sputum, SOB, hemoptysis, pleuritic pain, palpitaitons, heartburn, abdominal pain, melena, lower extremity edema, claudication, or rash.  All other systems reviewed and negative  General: Affect appropriate Cushingoid black female  HEENT: normal Neck supple with no adenopathy JVP normal no bruits no thyromegaly Lungs clear with no wheezing and good diaphragmatic motion Heart:  S1/S2 no murmur, no rub, gallop or click PMI normal Abdomen: benighn, BS positve, no tenderness, no AAA no bruit.  No HSM or HJR Distal pulses intact with no bruits No edema Neuro non-focal Skin warm and dry No muscular weakness   Current Outpatient Prescriptions  Medication Sig Dispense Refill  . albuterol (PROVENTIL HFA;VENTOLIN HFA) 108 (90 BASE) MCG/ACT inhaler Inhale 2 puffs into the lungs every 6 (six) hours as needed for wheezing or shortness of breath.      Marland Kitchen albuterol (PROVENTIL) (2.5 MG/3ML) 0.083% nebulizer solution Take 2.5 mg by nebulization every 6 (six) hours as needed for wheezing or shortness of breath.      Marland Kitchen aspirin 81 MG chewable tablet Chew 162 mg by mouth daily.      . carvedilol (  COREG) 6.25 MG tablet Take 1 tablet (6.25 mg total) by mouth 2 (two) times daily with a meal.  60 tablet  11  . furosemide (LASIX) 40 MG tablet Take 1 tablet (40 mg total) by mouth daily.  30 tablet  0  . hydrocortisone cream 1 % Apply 1 application topically 2 (two) times daily as needed for itching.      . hydroxychloroquine (PLAQUENIL) 200 MG tablet Take 1 tablet (200 mg total) by mouth daily.  15 tablet  0  . lisinopril (PRINIVIL,ZESTRIL) 10 MG tablet Take 1 tablet (10 mg total)  by mouth daily.  30 tablet  11  . Multiple Vitamin (MULTIVITAMIN WITH MINERALS) TABS Take 1 tablet by mouth daily.      . naproxen (NAPROSYN) 250 MG tablet Take 250 mg by mouth 2 (two) times daily with a meal.      . potassium chloride SA (K-DUR,KLOR-CON) 20 MEQ tablet Take 2 tablets (40 mEq total) by mouth daily.  60 tablet  11  . predniSONE (DELTASONE) 5 MG tablet Take 7 tablets (35 mg total) by mouth 2 (two) times daily with a meal.  90 tablet  0  . torsemide (DEMADEX) 10 MG tablet Take 1 tablet (10 mg total) by mouth as needed (as needed for swelling).  30 tablet  0   No current facility-administered medications for this visit.    Allergies  Dilaudid; Erythromycin; and Latex  Electrocardiogram:  SR LVH    Assessment and Plan

## 2014-03-17 NOTE — Assessment & Plan Note (Signed)
Supple as needed OTC mag oxide as well f/u BMET next visit

## 2014-03-17 NOTE — Assessment & Plan Note (Signed)
Stable moderate decrease in LV function f/u Mri in 6 months  Continue current meds

## 2014-03-23 ENCOUNTER — Other Ambulatory Visit: Payer: Self-pay

## 2014-03-23 MED ORDER — FUROSEMIDE 40 MG PO TABS: 40.0000 mg | ORAL_TABLET | Freq: Every day | ORAL | Status: DC

## 2014-03-28 ENCOUNTER — Encounter: Payer: Self-pay | Admitting: Cardiovascular Disease

## 2014-03-30 ENCOUNTER — Encounter (HOSPITAL_COMMUNITY): Payer: Self-pay | Admitting: Emergency Medicine

## 2014-03-30 ENCOUNTER — Emergency Department (HOSPITAL_COMMUNITY): Payer: 59

## 2014-03-30 ENCOUNTER — Emergency Department (HOSPITAL_COMMUNITY)
Admission: EM | Admit: 2014-03-30 | Discharge: 2014-03-30 | Disposition: A | Discharge status: Home / Self Care | Payer: 59 | Attending: Emergency Medicine | Admitting: Emergency Medicine

## 2014-03-30 ENCOUNTER — Telehealth: Payer: Self-pay | Admitting: Cardiovascular Disease

## 2014-03-30 DIAGNOSIS — I509 Heart failure, unspecified: Secondary | ICD-10-CM | POA: Insufficient documentation

## 2014-03-30 DIAGNOSIS — Z7982 Long term (current) use of aspirin: Secondary | ICD-10-CM | POA: Insufficient documentation

## 2014-03-30 DIAGNOSIS — Z8739 Personal history of other diseases of the musculoskeletal system and connective tissue: Secondary | ICD-10-CM | POA: Insufficient documentation

## 2014-03-30 DIAGNOSIS — Z7952 Long term (current) use of systemic steroids: Secondary | ICD-10-CM

## 2014-03-30 DIAGNOSIS — R0682 Tachypnea, not elsewhere classified: Secondary | ICD-10-CM | POA: Insufficient documentation

## 2014-03-30 DIAGNOSIS — Z791 Long term (current) use of non-steroidal anti-inflammatories (NSAID): Secondary | ICD-10-CM | POA: Insufficient documentation

## 2014-03-30 DIAGNOSIS — Z79899 Other long term (current) drug therapy: Secondary | ICD-10-CM | POA: Diagnosis not present

## 2014-03-30 DIAGNOSIS — Z91040 Latex allergy status: Secondary | ICD-10-CM | POA: Diagnosis not present

## 2014-03-30 DIAGNOSIS — I10 Essential (primary) hypertension: Secondary | ICD-10-CM | POA: Diagnosis not present

## 2014-03-30 DIAGNOSIS — J45901 Unspecified asthma with (acute) exacerbation: Secondary | ICD-10-CM | POA: Insufficient documentation

## 2014-03-30 DIAGNOSIS — R079 Chest pain, unspecified: Secondary | ICD-10-CM | POA: Diagnosis not present

## 2014-03-30 DIAGNOSIS — IMO0002 Reserved for concepts with insufficient information to code with codable children: Secondary | ICD-10-CM | POA: Insufficient documentation

## 2014-03-30 DIAGNOSIS — R0602 Shortness of breath: Secondary | ICD-10-CM | POA: Diagnosis present

## 2014-03-30 DIAGNOSIS — R739 Hyperglycemia, unspecified: Secondary | ICD-10-CM

## 2014-03-30 DIAGNOSIS — M129 Arthropathy, unspecified: Secondary | ICD-10-CM | POA: Insufficient documentation

## 2014-03-30 DIAGNOSIS — R7309 Other abnormal glucose: Secondary | ICD-10-CM | POA: Diagnosis not present

## 2014-03-30 HISTORY — DX: Heart failure, unspecified: I50.9

## 2014-03-30 LAB — URINALYSIS, ROUTINE W REFLEX MICROSCOPIC
Bilirubin Urine: NEGATIVE
Glucose, UA: >1000 mg/dL — AB
Hgb urine dipstick: NEGATIVE
Ketones, ur: NEGATIVE mg/dL
Leukocytes, UA: NEGATIVE
Nitrite: NEGATIVE
Protein, ur: NEGATIVE mg/dL
Specific Gravity, Urine: 1.041 — ABNORMAL HIGH (ref 1.005–1.030)
Urobilinogen, UA: 0.2 mg/dL (ref 0.0–1.0)
pH: 7.5 (ref 5.0–8.0)

## 2014-03-30 LAB — TROPONIN I: Troponin I: <0.3 ng/mL (ref <0.30)

## 2014-03-30 LAB — CBC WITH DIFFERENTIAL/PLATELET
Basophils Absolute: 0.1 10*3/uL (ref 0.0–0.1)
Basophils Relative: 1 % (ref 0–1)
Eosinophils Absolute: 0 10*3/uL (ref 0.0–0.7)
Eosinophils Relative: 0 % (ref 0–5)
HCT: 38.1 % (ref 36.0–46.0)
Hemoglobin: 13.1 g/dL (ref 12.0–15.0)
Lymphocytes Relative: 19 % (ref 12–46)
Lymphs Abs: 2 10*3/uL (ref 0.7–4.0)
MCH: 28.5 pg (ref 26.0–34.0)
MCHC: 34.4 g/dL (ref 30.0–36.0)
MCV: 83 fL (ref 78.0–100.0)
Monocytes Absolute: 0.5 10*3/uL (ref 0.1–1.0)
Monocytes Relative: 5 % (ref 3–12)
Neutro Abs: 7.8 10*3/uL — ABNORMAL HIGH (ref 1.7–7.7)
Neutrophils Relative %: 75 % (ref 43–77)
Platelets: 188 10*3/uL (ref 150–400)
RBC: 4.59 MIL/uL (ref 3.87–5.11)
RDW: 15.3 % (ref 11.5–15.5)
WBC: 10.4 10*3/uL (ref 4.0–10.5)

## 2014-03-30 LAB — BASIC METABOLIC PANEL
BUN: 23 mg/dL (ref 6–23)
CO2: 22 mEq/L (ref 19–32)
Calcium: 9.1 mg/dL (ref 8.4–10.5)
Chloride: 94 mEq/L — ABNORMAL LOW (ref 96–112)
Creatinine, Ser: 0.81 mg/dL (ref 0.50–1.10)
GFR calc Af Amer: >90 mL/min (ref >90)
GFR calc non Af Amer: 86 mL/min — ABNORMAL LOW (ref >90)
Glucose, Bld: 566 mg/dL (ref 70–99)
Potassium: 4.2 mEq/L (ref 3.7–5.3)
Sodium: 135 mEq/L — ABNORMAL LOW (ref 137–147)

## 2014-03-30 LAB — CBG MONITORING, ED
Glucose-Capillary: 475 mg/dL — ABNORMAL HIGH (ref 70–99)
Glucose-Capillary: 271 mg/dL — ABNORMAL HIGH (ref 70–99)

## 2014-03-30 LAB — URINE MICROSCOPIC-ADD ON

## 2014-03-30 LAB — PRO B NATRIURETIC PEPTIDE: Pro B Natriuretic peptide (BNP): 39.5 pg/mL (ref 0–125)

## 2014-03-30 MED ORDER — SODIUM CHLORIDE 0.9 % IV BOLUS (SEPSIS)
1000.0000 mL | Freq: Once | INTRAVENOUS | Status: AC
  Administered 2014-03-30: 1000 mL via INTRAVENOUS

## 2014-03-30 MED ORDER — IPRATROPIUM-ALBUTEROL 0.5-2.5 (3) MG/3ML IN SOLN
3.0000 mL | Freq: Once | RESPIRATORY_TRACT | Status: AC
  Administered 2014-03-30: 3 mL via RESPIRATORY_TRACT
  Filled 2014-03-30: qty 3

## 2014-03-30 MED ORDER — INSULIN ASPART 100 UNIT/ML ~~LOC~~ SOLN
12.0000 [IU] | Freq: Once | SUBCUTANEOUS | Status: AC
  Administered 2014-03-30: 12 [IU] via INTRAVENOUS
  Filled 2014-03-30: qty 1

## 2014-03-30 MED ORDER — IPRATROPIUM BROMIDE 0.02 % IN SOLN
0.5000 mg | Freq: Once | RESPIRATORY_TRACT | Status: DC
  Filled 2014-03-30: qty 2.5

## 2014-03-30 MED ORDER — ALBUTEROL SULFATE (2.5 MG/3ML) 0.083% IN NEBU
5.0000 mg | INHALATION_SOLUTION | Freq: Once | RESPIRATORY_TRACT | Status: DC
  Filled 2014-03-30: qty 6

## 2014-03-30 MED ORDER — ALBUTEROL SULFATE (2.5 MG/3ML) 0.083% IN NEBU
2.5000 mg | INHALATION_SOLUTION | Freq: Once | RESPIRATORY_TRACT | Status: AC
  Administered 2014-03-30: 2.5 mg via RESPIRATORY_TRACT
  Filled 2014-03-30: qty 3

## 2014-03-30 NOTE — Discharge Instructions (Signed)
Recommend starting to taper your steroids by 10mg  weekly until back to baseline of 10mg  as previously instructed by Dr. Johnsie Cancel. Follow up with Dr. Ouida Sills tomorrow at 2:30pm Your primary care office will contact you with follow-up appointment time. Return to the ED for new concerns.

## 2014-03-30 NOTE — Telephone Encounter (Signed)
Spoke with pt who reports she has increasing shortness of breath last few days. Has used breathing treatment with little improvement.  Is having some allergy symptoms. States this is the exact same feeling she had prior to recent hospitalization. Reports weight was 164 lbs on Saturday, 154 lbs yesterday and 156 lbs today.  Some chest tingling at times. Very tired. Pt sounds short of breath on phone when talking.  Frequent cough during conversation.  I have instructed pt she should go to ED at [facility] for evaluation. She will have daughter drive her.

## 2014-03-30 NOTE — ED Notes (Signed)
EKG performed by Janett Billow, RN at [phone]

## 2014-03-30 NOTE — ED Notes (Signed)
Pt transported to xray 

## 2014-03-30 NOTE — ED Notes (Signed)
CBG 475. 

## 2014-03-30 NOTE — Telephone Encounter (Signed)
New message    Pt is having sob, lost 10lbs in 2days, chest pains (tingling), slight swelling in feet.  Please advise.  No one answered in triage.  Will send msg to triage.

## 2014-03-30 NOTE — ED Provider Notes (Signed)
CSN: [id number]     Arrival date & time 03/30/14  [id number] History   First MD Initiated Contact with Patient 03/30/14 0901     Chief Complaint  Patient presents with  . Shortness of Breath  . Chest Pain     (Consider location/radiation/quality/duration/timing/severity/associated sxs/prior Treatment) Patient is a 43 y.o. female presenting with shortness of breath and chest pain. The history is provided by the patient and medical records.  Shortness of Breath Associated symptoms: chest pain   Chest Pain Associated symptoms: shortness of breath    This is a 43 year old female with past medical history significant for hypertension, asthma, fibromyalgia, lipase, congestive heart failure, presenting to the ED for shortness of breath over the past 3 days. Patient states shortness of breath worse with exertional activities. She is beginning to feel that there is "fluid" backing up into her lungs causing some heaviness in her chest.  Denies any diaphoresis, palpitations, dizziness, weakness, nausea, or vomiting. Patient states she's actually lost weight over the past several days and has been urinating frequently (more than her baseline) without change in her dose of home Lasix.  Denies any significant lower extremity swelling but does have a feeling of "heaviness" as if there is fluid in her legs.  Denies orthopnea. Patient used home neb treatments with some improvement.  Has had a dry cough which he thinks is due to her allergies. She denies any fevers, chills, sweats, or recent sick contacts.  No recent travel, surgeries, calf pain.  No prior hx of PE or DVT.  States her sx today seem identical to prior admissions for CHF exacerbations.  Pt is followed by cardiology, Dr. Johnsie Cancel.  Last 2D echo on 03/02/14 with estimated EF of 35-40%, mild LVH.  Pt tachypneic on arrival, O2 sats stable on RA. PCP-- [facility]  Past Medical History  Diagnosis Date  . Lupus (systemic lupus erythematosus)   . Arthritis   .  Hypertension   . Asthma   . Fibromyalgia   . CHF (congestive heart failure)    Past Surgical History  Procedure Laterality Date  . Knee surgery    . Tubal ligation    . Abdominal hysterectomy  2009   History reviewed. No pertinent family history. History  Substance Use Topics  . Smoking status: Never Smoker   . Smokeless tobacco: Never Used  . Alcohol Use: No   OB History   Grav Para Term Preterm Abortions TAB SAB Ect Mult Living                 Review of Systems  Respiratory: Positive for shortness of breath.   Cardiovascular: Positive for chest pain.  All other systems reviewed and are negative.     Allergies  Dilaudid; Erythromycin; and Latex  Home Medications   Current Outpatient Rx  Name  Route  Sig  Dispense  Refill  . albuterol (PROVENTIL HFA;VENTOLIN HFA) 108 (90 BASE) MCG/ACT inhaler   Inhalation   Inhale 2 puffs into the lungs every 6 (six) hours as needed for wheezing or shortness of breath.         Marland Kitchen albuterol (PROVENTIL) (2.5 MG/3ML) 0.083% nebulizer solution   Nebulization   Take 2.5 mg by nebulization every 6 (six) hours as needed for wheezing or shortness of breath.         Marland Kitchen aspirin 81 MG chewable tablet   Oral   Chew 162 mg by mouth daily.         . carvedilol (COREG)  6.25 MG tablet   Oral   Take 1 tablet (6.25 mg total) by mouth 2 (two) times daily with a meal.   60 tablet   11   . furosemide (LASIX) 40 MG tablet   Oral   Take 1 tablet (40 mg total) by mouth daily.   30 tablet   6   . hydrocortisone cream 1 %   Topical   Apply 1 application topically 2 (two) times daily as needed for itching.         . hydroxychloroquine (PLAQUENIL) 200 MG tablet   Oral   Take 1 tablet (200 mg total) by mouth daily.   15 tablet   0   . lisinopril (PRINIVIL,ZESTRIL) 10 MG tablet   Oral   Take 1 tablet (10 mg total) by mouth daily.   30 tablet   11   . Multiple Vitamin (MULTIVITAMIN WITH MINERALS) TABS   Oral   Take 1 tablet by  mouth daily.         . naproxen (NAPROSYN) 250 MG tablet   Oral   Take 250 mg by mouth 2 (two) times daily with a meal.         . potassium chloride SA (K-DUR,KLOR-CON) 20 MEQ tablet   Oral   Take 2 tablets (40 mEq total) by mouth daily.   60 tablet   11   . predniSONE (DELTASONE) 5 MG tablet   Oral   Take 7 tablets (35 mg total) by mouth 2 (two) times daily with a meal.   90 tablet   0     Decrease by 10 mg every week, until 20 mg daily. T ...   . torsemide (DEMADEX) 10 MG tablet   Oral   Take 1 tablet (10 mg total) by mouth as needed (as needed for swelling).   30 tablet   0    BP 115/78  Pulse 110  Temp(Src) 98.5 F (36.9 C) (Oral)  Resp 22  Wt 148 lb 6 oz (67.302 kg)  SpO2 100%  Physical Exam  Nursing note and vitals reviewed. Constitutional: She is oriented to person, place, and time. She appears well-developed and well-nourished. No distress.  HENT:  Head: Normocephalic and atraumatic.  Mouth/Throat: Oropharynx is clear and moist.  Eyes: Conjunctivae and EOM are normal. Pupils are equal, round, and reactive to light.  Neck: Normal range of motion. Neck supple.  Cardiovascular: Normal rate, regular rhythm and normal heart sounds.   Pulmonary/Chest: Breath sounds normal. Tachypnea noted. No respiratory distress. She has no decreased breath sounds. She has no wheezes. She has no rhonchi. She has no rales.  Tachypneic without accessory muscle use or retractions; lungs clear without audible wheezes, rhonchi, or rales  Abdominal: Soft. Bowel sounds are normal. There is no tenderness. There is no guarding.  Musculoskeletal: Normal range of motion. She exhibits no edema.  No significant lower extremity edema; no calf pain, asymmetry, or palpable cords  Neurological: She is alert and oriented to person, place, and time.  Skin: Skin is warm and dry. She is not diaphoretic.  Psychiatric: She has a normal mood and affect.    ED Course  Procedures (including  critical care time) Labs Review Labs Reviewed  CBC WITH DIFFERENTIAL - Abnormal; Notable for the following:    Neutro Abs 7.8 (*)    All other components within normal limits  BASIC METABOLIC PANEL - Abnormal; Notable for the following:    Sodium 135 (*)    Chloride 94 (*)  Glucose, Bld 566 (*)    GFR calc non Af Amer 86 (*)    All other components within normal limits  URINALYSIS, ROUTINE W REFLEX MICROSCOPIC - Abnormal; Notable for the following:    Specific Gravity, Urine 1.041 (*)    Glucose, UA >1000 (*)    All other components within normal limits  CBG MONITORING, ED - Abnormal; Notable for the following:    Glucose-Capillary 475 (*)    All other components within normal limits  CBG MONITORING, ED - Abnormal; Notable for the following:    Glucose-Capillary 271 (*)    All other components within normal limits  TROPONIN I  PRO B NATRIURETIC PEPTIDE  URINE MICROSCOPIC-ADD ON   Imaging Review Dg Chest 2 View  03/30/2014   CLINICAL DATA:  Shortness of breath, chest pain  EXAM: CHEST  2 VIEW  COMPARISON:  01/26/2014  FINDINGS: Mild cardiomegaly. Resolved CHF compared to prior exam. No current edema, pneumonia, collapse or consolidation. No effusion or pneumothorax. Trachea midline.  IMPRESSION: Cardiomegaly without current CHF or acute process   Electronically Signed   By: Daryll Brod M.D.   On: 03/30/2014 09:36     EKG Interpretation   Date/Time:  Wednesday March 30 2014 08:58:06 EDT Ventricular Rate:  111 PR Interval:  178 QRS Duration: 78 QT Interval:  314 QTC Calculation: 427 R Axis:   55 Text Interpretation:  Sinus tachycardia Otherwise normal ECG Confirmed by  HARRISON  MD, FORREST ([id number]) on 03/30/2014 9:15:06 AM      MDM   Final diagnoses:  Hyperglycemia  On prednisone therapy  Chest pain  Shortness of breath   43 y.o. F with hx of CHF and asthma presenting to the ED for SOB and chest pressure which she states feels like prior CHF exacerbations.  No RF or  prior hx of PE and has 2 negative studies for PE within the last 2 months.  On arrival, pt is tachypneic with clear lung sounds, mild tachycardia- possibly from home albuterol neb txs.  Will obtain cbc, bmp, bnp, trop, ekg, CXR.  Additional albuterol/atrovent neb tx given.    Breathing back to baseline after neb txs.  VS have stabilized.  EKG sinus tach, no acute ischemic changes. Troponin is negative. Chest x-ray is clear. BNP normal today.  Low suspicion for ACS, PE, dissection or other acute cardiac event at this time.  Glucose elevated at 566-- pt is not known diabetic but is on chronic prednisone therapy for her lupus.  States dose was recently increased after last hospitalization.  Was previously on 10 daily, now 35 daily which was supposed to be tapered as per Dr. Kyla Balzarine orders for myocarditis but she states her rheumatologist, Dr. Ouida Sills has advised her not to reduce this due to acute Lupus flare. Anion gap 18, U/a without ketones.  Pt without nausea or vomiting.  Clinically not DKA.  Will aim for glucose control and likely discharge.  Significant improvement of CBG after 2L fluid bolus and 12 units IV insulin, now 271.  Case was discussed with pts PCP office-- states she is historically non-complaint and has not been seen in several months but they will call her to schedule FU appt for later this week. Spoke with pts Rheumatologist, Dr. Ouida Sills-- states she was a no show at last scheduled appt in February, also notes she is non-complaint and he is unsure why she has not been tapering her prednisone but he has scheduled her a FU appt for tomorrow afternoon  to assist with close management.  I have instructed pt to begin tapering her prednisone by 10mg  weekly until back to her baseline 10mg  daily as previously instructed by Dr. Johnsie Cancel.  She will FU with appts that i have arranged for her.  Discussed plan with pt and family at bedside, they acknowledged understanding and agreed with plan of care.   Return precautions advised for new or worsening symptoms.  Discussed with Dr. Aline Brochure who agrees with assessment and plan of care.    Larene Pickett, PA-C 03/30/14 1544

## 2014-03-30 NOTE — ED Notes (Signed)
CBG 271  

## 2014-03-30 NOTE — ED Notes (Signed)
Pt c/o CP and SOB starting 2 days ago; pt tachyponic at present; pt sts pain in chest and hx of CHF; pt sts increased urination with fluid pills

## 2014-03-30 NOTE — ED Notes (Signed)
CBG 566, provider notified.

## 2014-03-30 NOTE — ED Provider Notes (Signed)
Medical screening examination/treatment/procedure(s) were performed by non-physician practitioner and as supervising physician I was immediately available for consultation/collaboration.   EKG Interpretation   Date/Time:  Wednesday March 30 2014 08:58:06 EDT Ventricular Rate:  111 PR Interval:  178 QRS Duration: 78 QT Interval:  314 QTC Calculation: 427 R Axis:   55 Text Interpretation:  Sinus tachycardia Otherwise normal ECG Confirmed by  HARRISON  MD, FORREST ([id number]) on 03/30/2014 9:15:06 AM        Shea Evans Ruthe Mannan, MD 03/30/14 2202

## 2014-03-30 NOTE — ED Notes (Signed)
Pt states shortness of breath and diffuse chest pressure. States progressive symptoms x2 days. Pt speaking complete sentences. Respirations unlabored. Expiratory wheezing noted. Pt is alert and oriented x4. 5/10 chest pressure at the time. Pt states history of lupus and CHF. Taking lasix as prescribed.

## 2014-04-01 ENCOUNTER — Encounter: Payer: Self-pay | Admitting: Cardiovascular Disease

## 2014-05-17 ENCOUNTER — Encounter: Payer: 59 | Attending: Internal Medicine

## 2014-05-17 ENCOUNTER — Encounter: Payer: Self-pay | Admitting: Cardiovascular Disease

## 2014-05-17 VITALS — Ht 66.0 in | Wt 165.1 lb

## 2014-05-17 DIAGNOSIS — Z713 Dietary counseling and surveillance: Secondary | ICD-10-CM | POA: Insufficient documentation

## 2014-05-17 DIAGNOSIS — E119 Type 2 diabetes mellitus without complications: Secondary | ICD-10-CM

## 2014-05-24 ENCOUNTER — Encounter: Payer: 59 | Attending: Internal Medicine

## 2014-05-24 DIAGNOSIS — Z713 Dietary counseling and surveillance: Secondary | ICD-10-CM | POA: Insufficient documentation

## 2014-05-24 DIAGNOSIS — E119 Type 2 diabetes mellitus without complications: Secondary | ICD-10-CM | POA: Insufficient documentation

## 2014-05-24 NOTE — Progress Notes (Signed)
Patient was seen on 05/17/14 for the first of a series of three diabetes self-management courses at the [facility].  Current HbA1c: 10.7%  The following learning objectives were met by the patient during this class:  Describe diabetes  State some common risk factors for diabetes  Defines the role of glucose and insulin  Identifies type of diabetes and pathophysiology  Describe the relationship between diabetes and cardiovascular risk  State the members of the Healthcare Team  States the rationale for glucose monitoring  State when to test glucose  State their individual Target Range  State the importance of logging glucose readings  Describe how to interpret glucose readings  Identifies A1C target  Explain the correlation between A1c and eAG values  State symptoms and treatment of high blood glucose  State symptoms and treatment of low blood glucose  Explain proper technique for glucose testing  Identifies proper sharps disposal  Handouts given during class include:  Living Well with Diabetes book  Carb Counting and Meal Planning book  Meal Plan Card  Carbohydrate guide  Meal planning worksheet  Low Sodium Flavoring Tips  The diabetes portion plate  J6O to eAG Conversion Chart  Diabetes Medications  Diabetes Recommended Care Schedule  Support Group  Diabetes Success Plan  Core Class Satisfaction Survey  Follow-Up Plan:  Attend core 2

## 2014-05-25 NOTE — Progress Notes (Signed)

## 2014-05-31 DIAGNOSIS — E119 Type 2 diabetes mellitus without complications: Secondary | ICD-10-CM

## 2014-05-31 NOTE — Progress Notes (Signed)
Patient was seen on 05/31/2014 for the third of a series of three diabetes self-management courses at the [facility]. The following learning objectives were met by the patient during this class:    State the amount of activity recommended for healthy living   Describe activities suitable for individual needs   Identify ways to regularly incorporate activity into daily life   Identify barriers to activity and ways to over come these barriers  Identify diabetes medications being personally used and their primary action for lowering glucose and possible side effects   Describe role of stress on blood glucose and develop strategies to address psychosocial issues   Identify diabetes complications and ways to prevent them  Explain how to manage diabetes during illness   Evaluate success in meeting personal goal   Establish 2-3 goals that they will plan to diligently work on until they return for the  23-monthfollow-up visit  Goals:  Follow Diabetes Meal Plan as instructed  Aim for 15-30 mins of physical activity daily as tolerated  Bring food record and glucose log to your follow up visit  Your patient has established the following 4 month goals in their individualized success plan: I will count my carb choices at most meals and snacks I will increase my activity level at least 150 minutes per week  I will take my diabetes medications as scheduled  Your patient has identified these potential barriers to change:  Finances  Stress  Your patient has identified their diabetes self-care support plan as  [facility]  Family Support  On-Line Resources  Plan:  Attend Core 4 in 4 months

## 2014-06-30 ENCOUNTER — Telehealth: Payer: Self-pay | Admitting: Cardiovascular Disease

## 2014-06-30 ENCOUNTER — Emergency Department (HOSPITAL_COMMUNITY): Payer: 59

## 2014-06-30 ENCOUNTER — Encounter (HOSPITAL_COMMUNITY): Payer: Self-pay | Admitting: Emergency Medicine

## 2014-06-30 ENCOUNTER — Other Ambulatory Visit: Payer: Self-pay | Admitting: Cardiology

## 2014-06-30 ENCOUNTER — Emergency Department (HOSPITAL_COMMUNITY)
Admission: EM | Admit: 2014-06-30 | Discharge: 2014-06-30 | Disposition: A | Discharge status: Home / Self Care | Payer: 59 | Attending: Emergency Medicine | Admitting: Emergency Medicine

## 2014-06-30 DIAGNOSIS — R55 Syncope and collapse: Secondary | ICD-10-CM

## 2014-06-30 DIAGNOSIS — Z7982 Long term (current) use of aspirin: Secondary | ICD-10-CM | POA: Insufficient documentation

## 2014-06-30 DIAGNOSIS — R42 Dizziness and giddiness: Secondary | ICD-10-CM | POA: Insufficient documentation

## 2014-06-30 DIAGNOSIS — J45901 Unspecified asthma with (acute) exacerbation: Secondary | ICD-10-CM | POA: Insufficient documentation

## 2014-06-30 DIAGNOSIS — M129 Arthropathy, unspecified: Secondary | ICD-10-CM | POA: Insufficient documentation

## 2014-06-30 DIAGNOSIS — I420 Dilated cardiomyopathy: Secondary | ICD-10-CM

## 2014-06-30 DIAGNOSIS — I509 Heart failure, unspecified: Secondary | ICD-10-CM | POA: Insufficient documentation

## 2014-06-30 DIAGNOSIS — I10 Essential (primary) hypertension: Secondary | ICD-10-CM | POA: Insufficient documentation

## 2014-06-30 DIAGNOSIS — I5042 Chronic combined systolic (congestive) and diastolic (congestive) heart failure: Secondary | ICD-10-CM

## 2014-06-30 DIAGNOSIS — IMO0001 Reserved for inherently not codable concepts without codable children: Secondary | ICD-10-CM | POA: Insufficient documentation

## 2014-06-30 DIAGNOSIS — Z794 Long term (current) use of insulin: Secondary | ICD-10-CM | POA: Insufficient documentation

## 2014-06-30 DIAGNOSIS — IMO0002 Reserved for concepts with insufficient information to code with codable children: Secondary | ICD-10-CM | POA: Insufficient documentation

## 2014-06-30 DIAGNOSIS — Z79899 Other long term (current) drug therapy: Secondary | ICD-10-CM | POA: Insufficient documentation

## 2014-06-30 DIAGNOSIS — Z91040 Latex allergy status: Secondary | ICD-10-CM | POA: Insufficient documentation

## 2014-06-30 DIAGNOSIS — R0602 Shortness of breath: Secondary | ICD-10-CM

## 2014-06-30 DIAGNOSIS — I428 Other cardiomyopathies: Secondary | ICD-10-CM

## 2014-06-30 DIAGNOSIS — M329 Systemic lupus erythematosus, unspecified: Secondary | ICD-10-CM | POA: Insufficient documentation

## 2014-06-30 DIAGNOSIS — E119 Type 2 diabetes mellitus without complications: Secondary | ICD-10-CM | POA: Insufficient documentation

## 2014-06-30 LAB — BASIC METABOLIC PANEL
Anion gap: 17 — ABNORMAL HIGH (ref 5–15)
BUN: 21 mg/dL (ref 6–23)
CO2: 23 mEq/L (ref 19–32)
Calcium: 9.2 mg/dL (ref 8.4–10.5)
Chloride: 103 mEq/L (ref 96–112)
Creatinine, Ser: 1.06 mg/dL (ref 0.50–1.10)
GFR calc Af Amer: 72 mL/min — ABNORMAL LOW (ref >90)
GFR calc non Af Amer: 62 mL/min — ABNORMAL LOW (ref >90)
Glucose, Bld: 135 mg/dL — ABNORMAL HIGH (ref 70–99)
Potassium: 3.8 mEq/L (ref 3.7–5.3)
Sodium: 143 mEq/L (ref 137–147)

## 2014-06-30 LAB — CBC
HCT: 40.1 % (ref 36.0–46.0)
Hemoglobin: 12.7 g/dL (ref 12.0–15.0)
MCH: 29.7 pg (ref 26.0–34.0)
MCHC: 31.7 g/dL (ref 30.0–36.0)
MCV: 93.9 fL (ref 78.0–100.0)
Platelets: 205 10*3/uL (ref 150–400)
RBC: 4.27 MIL/uL (ref 3.87–5.11)
RDW: 14.2 % (ref 11.5–15.5)
WBC: 8.7 10*3/uL (ref 4.0–10.5)

## 2014-06-30 LAB — URINALYSIS, ROUTINE W REFLEX MICROSCOPIC
Bilirubin Urine: NEGATIVE
Glucose, UA: >1000 mg/dL — AB
Hgb urine dipstick: NEGATIVE
Ketones, ur: NEGATIVE mg/dL
Leukocytes, UA: NEGATIVE
Nitrite: NEGATIVE
Protein, ur: NEGATIVE mg/dL
Specific Gravity, Urine: 1.03 (ref 1.005–1.030)
Urobilinogen, UA: 1 mg/dL (ref 0.0–1.0)
pH: 7.5 (ref 5.0–8.0)

## 2014-06-30 LAB — URINE MICROSCOPIC-ADD ON

## 2014-06-30 LAB — TROPONIN I: Troponin I: <0.3 ng/mL (ref <0.30)

## 2014-06-30 LAB — I-STAT TROPONIN, ED: Troponin i, poc: 0 ng/mL (ref 0.00–0.08)

## 2014-06-30 LAB — CBG MONITORING, ED: Glucose-Capillary: 101 mg/dL — ABNORMAL HIGH (ref 70–99)

## 2014-06-30 LAB — PRO B NATRIURETIC PEPTIDE: Pro B Natriuretic peptide (BNP): 13.5 pg/mL (ref 0–125)

## 2014-06-30 NOTE — Telephone Encounter (Signed)
SPOKE WITH  PT   IS  EXPERIENCING EXTREME   SOB   STARTED TODAY   BLOOD  SUGAR NORMAL    WEIGHT  IS  NORMAL  NOT SURE IF  IS LUPUS  RELATED    B/P THIS  AM   WAS  133/101  IS  REQUESTING TO BE  SEEN  TODAY WILL DISCUSS WITH  DR  KATZ./CY

## 2014-06-30 NOTE — Consult Note (Signed)
Reason for Consult: Dyspnea, presyncope, palpitations Lupus cardiomyopathy  Requesting Physician: Fredderick Severance  Cardiologist: Johnsie Cancel   HPI: This is a delightful 43 y.o. female with a past medical history significant for lupus myocarditis/cardiomyopathy with LVEF around 40%, generally well compensated on medical therapy (carvedilol, lisinopril, furosemide, occasional prn torsemide). On chronic prednisone, plaquenil and methothrexate. Also has DM, started on Invokana last month and asthma.   While sitting in chair at work today she experienced sudden lightheadedness/ near-syncope associated with strong rapid palpitations and worsening dyspnea. Checked her blood glucose - was 111. Symptoms resolved spontaneously around the time she arrived in ED. She had felt tired and slept poorly for the preceding 2 days and has had some reduction in stamina for an undefined period of time before that. She had some fleeting chest pain which does not appear to be a dominant complaint. She has generalized arthralgia., but no joint swelling, rash, fever , mucosal ulcers, hematuria or hair loss.   2-3 weeks ago she had a roughly 10 lb weight gain and pedal edema (she showed me a photo of her swollen ankles on her phone), but was not particularly dyspneic then. When she had CHF in February, her BNP was >1200. Today it is 13.  Telemetry in ED and her 12 lead ECG are normal. CXR shows very clear lung fields and stable cardiomegaly. Routine labs are normal.  PMHx:  Past Medical History  Diagnosis Date  . Lupus (systemic lupus erythematosus)   . Arthritis   . Hypertension   . Asthma   . Fibromyalgia   . CHF (congestive heart failure)   . 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 Knee surgery    . Tubal ligation    . Abdominal hysterectomy  2009    FAMHx: Family History  Problem Relation Age of Onset  . Diabetes Other   .  Hypertension Other     SOCHx:  reports that she has never smoked. She has never used smokeless tobacco. She reports that she does not drink alcohol or use illicit drugs.  ALLERGIES: Allergies  Allergen Reactions  . Capsaicin Anaphylaxis    Pt says she doesn't know what this is and she has never taken it  . Dilaudid [Hydromorphone Hcl] Nausea And Vomiting and Other (See Comments)    Raises blood pressure   . Erythromycin Nausea And Vomiting  . Latex Hives    ROS: Reviewed in detail above Constitutional: positive for fatigue, negative for chills, fevers, night sweats and weight loss Eyes: negative Ears, nose, mouth, throat, and face: negative for epistaxis, hoarseness, sore mouth, sore throat and voice change Respiratory: positive for dyspnea, negative for cough, hemoptysis, pleurisy/chest pain, sputum and wheezing Cardiovascular: positive for dyspnea, near-syncope and palpitations, negative for claudication, exertional chest pressure/discomfort, orthopnea and paroxysmal nocturnal dyspnea Gastrointestinal: negative for constipation, diarrhea, dysphagia, jaundice, nausea, odynophagia and vomiting Genitourinary:negative for dysuria, frequency and hematuria Integument/breast: negative for rash, skin color change and skin lesion(s) Hematologic/lymphatic: negative for bleeding, easy bruising and petechiae Musculoskeletal:positive for arthralgias, negative for muscle weakness and stiff joints Neurological: negative Behavioral/Psych: positive for recent separation from boyfriend Endocrine: positive for diabetic symptoms including polydipsia and polyuria Allergic/Immunologic: negative  HOME MEDICATIONS: No current facility-administered medications on file prior to encounter.   Current Outpatient Prescriptions on File Prior to Encounter  Medication Sig Dispense Refill  . albuterol (PROVENTIL HFA;VENTOLIN HFA) 108 (90 BASE) MCG/ACT inhaler  Inhale 2 puffs into the lungs every 6 (six) hours  as needed for wheezing or shortness of breath.      Marland Kitchen albuterol (PROVENTIL) (2.5 MG/3ML) 0.083% nebulizer solution Take 2.5 mg by nebulization every 6 (six) hours as needed for wheezing or shortness of breath.      Marland Kitchen aspirin 81 MG chewable tablet Chew 162 mg by mouth daily.      . carvedilol (COREG) 6.25 MG tablet Take 1 tablet (6.25 mg total) by mouth 2 (two) times daily with a meal.  60 tablet  11  . fluconazole (DIFLUCAN) 150 MG tablet Take 150 mg by mouth daily.      . folic acid (FOLVITE) 1 MG tablet Take 1 mg by mouth daily.       . furosemide (LASIX) 40 MG tablet Take 1 tablet (40 mg total) by mouth daily.  30 tablet  6  . hydroxychloroquine (PLAQUENIL) 200 MG tablet Take 1 tablet (200 mg total) by mouth daily.  15 tablet  0  . insulin detemir (LEVEMIR) 100 UNIT/ML injection Inject 15-30 Units into the skin 2 (two) times daily. 15 units in the morning and 30 units at night      . lisinopril (PRINIVIL,ZESTRIL) 10 MG tablet Take 1 tablet (10 mg total) by mouth daily.  30 tablet  11  . methotrexate (RHEUMATREX) 2.5 MG tablet Take 2.5 mg by mouth every Tuesday.       . Multiple Vitamin (MULTIVITAMIN WITH MINERALS) TABS Take 1 tablet by mouth daily.      . potassium chloride SA (K-DUR,KLOR-CON) 20 MEQ tablet Take 2 tablets (40 mEq total) by mouth daily.  60 tablet  11  . Vitamin D, Ergocalciferol, (DRISDOL) 50000 UNITS CAPS capsule Take 50,000 Units by mouth See admin instructions. Take 1 capsule on Tuesdays and Fridays         VITALS: Blood pressure 106/66, pulse 80, temperature 98.2 F (36.8 C), temperature source Oral, resp. rate 14, SpO2 100.00%.  PHYSICAL EXAM:  General: Alert, oriented x3, no distress Head: no evidence of trauma, PERRL, EOMI, no exophtalmos or lid lag, no myxedema, no xanthelasma; normal ears, nose and oropharynx Neck: flat jugular venous pulsations and no hepatojugular reflux; brisk carotid pulses without delay and no carotid bruits Chest: clear to auscultation, no  signs of consolidation by percussion or palpation, normal fremitus, symmetrical and full respiratory excursions Cardiovascular: normal position and quality of the apical impulse, regular rhythm, normal first heart sound and normal second heart sound, no rubs or gallops, no murmur Abdomen: no tenderness or distention, no masses by palpation, no abnormal pulsatility or arterial bruits, normal bowel sounds, no hepatosplenomegaly Extremities: no clubbing, cyanosis;  no edema; 2+ radial, ulnar and brachial pulses bilaterally; 2+ right femoral, posterior tibial and dorsalis pedis pulses; 2+ left femoral, posterior tibial and dorsalis pedis pulses; no subclavian or femoral bruits Neurological: grossly nonfocal   LABS  CBC  Recent Labs  06/30/14 1443  WBC 8.7  HGB 12.7  HCT 40.1  MCV 93.9  PLT 016   Basic Metabolic Panel  Recent Labs  06/30/14 1443  NA 143  K 3.8  CL 103  CO2 23  GLUCOSE 135*  BUN 21  CREATININE 1.06  CALCIUM 9.2   Liver Function Tests No results found for this basename: AST, ALT, ALKPHOS, BILITOT, PROT, ALBUMIN,  in the last 72 hours No results found for this basename: LIPASE, AMYLASE,  in the last 72 hours Cardiac Enzymes  Recent Labs  06/30/14  [place] <0.30    IMAGING: Dg Chest 2 View  06/30/2014   CLINICAL DATA:  Shortness of breath  EXAM: CHEST  2 VIEW  COMPARISON:  03/30/2014  FINDINGS: Cardiac shadow is mildly enlarged. The lungs are clear. No vascular congestion is seen. No bony abnormality is noted. The upper abdomen is within normal limits.  IMPRESSION: Stable cardiomegaly without acute abnormality.   Electronically Signed   By: Inez Catalina M.D.   On: 06/30/2014 18:50    ECG: NSR, mildly prolonged QT  TELEMETRY: NSR  IMPRESSION: 1. Near syncope - consider arrhythmia or hypotension 2. Chronic HF, no evidence of exacerbation/hypervolemia 3. Lupus related cardiomyopathy 4. DM, compensated  RECOMMENDATION: 1. Event  monitor 2. Repeat limited echo for LVEF/pericardial effusion 3. Once workup complete, f/u with Dr. Johnsie Cancel 4. Suggested brief in hospital monitoring, but she declined 5. Alternatively, consider Invokana related dehydration/hypotension  Time Spent Directly with Patient: 27 minutes  Sanda Klein, MD, [facility] [phone] office [phone] pager   06/30/2014, 8:41 PM

## 2014-06-30 NOTE — ED Notes (Signed)
Pt in c/o chest pain and shortness of breath that started around 2 hours ago while at work, pain to left breast area, also dizziness, pt speaking in short phrases, rates pain 8/10 at this time

## 2014-06-30 NOTE — Telephone Encounter (Signed)
New message     Pt is having a lot of sob and is feeling dizzy.  It started an hour ago.  Please advise

## 2014-06-30 NOTE — Telephone Encounter (Signed)
DISCUSSED WITH DR  Ron Parker  AFTER REVIEWING  ECHO  AND LAST OFFICE  NOTE PT  NEEDS TO  BE SEEN  IN  ER   TO BE  EVAL AND  TX. PT  AWARE .CY

## 2014-06-30 NOTE — ED Provider Notes (Signed)
CSN: [id number]     Arrival date & time 06/30/14  1422 History   First MD Initiated Contact with Patient 06/30/14 1722     Chief Complaint  Patient presents with  . Chest Pain     (Consider location/radiation/quality/duration/timing/severity/associated sxs/prior Treatment) HPI 43 year old female with past medical history of lupus CHF and diabetes but presented today with chest pain and shortness of breath. Patient states that for the last 2 days she has been having a pressure-like chest pain that is exasperated with exertion as well as associated shortness of breath. The pain is substernal and currently 6/10. Short walks are making the patient have labored breathing. Patient also has presyncope with onset this morning. Patient denies any syncopal episodes. Patient states the last time she had the symptoms she was diagnosed with CHF. Patient's last echo revealed EF of 35-45%. Patient denies any fevers cough congestion dysuria hematuria or abdominal pain.  Past Medical History  Diagnosis Date  . Lupus (systemic lupus erythematosus)   . Arthritis   . Hypertension   . Asthma   . Fibromyalgia   . CHF (congestive heart failure)   . 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 Knee surgery    . Tubal ligation    . Abdominal hysterectomy  2009   Family History  Problem Relation Age of Onset  . Diabetes Other   . Hypertension Other    History  Substance Use Topics  . Smoking status: Never Smoker   . Smokeless tobacco: Never Used  . Alcohol Use: No   OB History   Grav Para Term Preterm Abortions TAB SAB Ect Mult Living                 Review of Systems  Constitutional: Negative for activity change.  HENT: Negative for congestion.   Respiratory: Positive for shortness of breath. Negative for cough.   Cardiovascular: Positive for chest pain. Negative for leg swelling.  Gastrointestinal: Negative for nausea,  vomiting, abdominal pain, diarrhea, constipation, blood in stool and abdominal distention.  Genitourinary: Negative for dysuria, flank pain and vaginal discharge.  Musculoskeletal: Negative for back pain.  Skin: Negative for color change.  Neurological: Positive for light-headedness. Negative for syncope and headaches.  Psychiatric/Behavioral: Negative for agitation.      Allergies  Capsaicin; Dilaudid; Erythromycin; and Latex  Home Medications   Prior to Admission medications   Medication Sig Start Date End Date Taking? Authorizing Provider  albuterol (PROVENTIL HFA;VENTOLIN HFA) 108 (90 BASE) MCG/ACT inhaler Inhale 2 puffs into the lungs every 6 (six) hours as needed for wheezing or shortness of breath.    Historical Provider, MD  albuterol (PROVENTIL) (2.5 MG/3ML) 0.083% nebulizer solution Take 2.5 mg by nebulization every 6 (six) hours as needed for wheezing or shortness of breath.    Historical Provider, MD  aspirin 81 MG chewable tablet Chew 162 mg by mouth daily.    Historical Provider, MD  carvedilol (COREG) 6.25 MG tablet Take 1 tablet (6.25 mg total) by mouth 2 (two) times daily with a meal. 02/28/14   Josue Hector, MD  fluconazole (DIFLUCAN) 150 MG tablet Take 150 mg by mouth daily.    Historical Provider, MD  folic acid (FOLVITE) 1 MG tablet Take 1 mg by mouth as directed.    Historical Provider, MD  furosemide (LASIX) 40 MG tablet Take 1 tablet (40 mg total) by mouth daily. 03/23/14   Collier Salina  Tommy Rainwater, MD  HYDROcodone-acetaminophen (NORCO/VICODIN) 5-325 MG per tablet Take 1 tablet by mouth every 6 (six) hours as needed for moderate pain.    Historical Provider, MD  hydrocortisone cream 1 % Apply 1 application topically 2 (two) times daily as needed for itching.    Historical Provider, MD  hydroxychloroquine (PLAQUENIL) 200 MG tablet Take 1 tablet (200 mg total) by mouth daily. 01/30/14   [name], MD  insulin detemir (LEVEMIR) 100 UNIT/ML injection Inject into the skin as  directed.    Historical Provider, MD  lisinopril (PRINIVIL,ZESTRIL) 10 MG tablet Take 1 tablet (10 mg total) by mouth daily. 02/28/14   Josue Hector, MD  lisinopril-hydrochlorothiazide (PRINZIDE,ZESTORETIC) 10-12.5 MG per tablet Take 1 tablet by mouth daily.    Historical Provider, MD  methotrexate (RHEUMATREX) 2.5 MG tablet Take 2.5 mg by mouth as directed.    Historical Provider, MD  Multiple Vitamin (MULTIVITAMIN WITH MINERALS) TABS Take 1 tablet by mouth daily.    Historical Provider, MD  potassium chloride SA (K-DUR,KLOR-CON) 20 MEQ tablet Take 2 tablets (40 mEq total) by mouth daily. 02/28/14   Josue Hector, MD  predniSONE (DELTASONE) 5 MG tablet Take 7 tablets (35 mg total) by mouth 2 (two) times daily with a meal. 01/30/14   [name], MD  torsemide (DEMADEX) 10 MG tablet Take 1 tablet (10 mg total) by mouth as needed (as needed for swelling). 02/24/14   Josue Hector, MD  Vitamin D, Ergocalciferol, (DRISDOL) 50000 UNITS CAPS capsule Take 50,000 Units by mouth as directed.    Historical Provider, MD   BP 112/58  Pulse 67  Temp(Src) 98.2 F (36.8 C) (Oral)  Resp 18  SpO2 96% Physical Exam  Constitutional: She is oriented to person, place, and time. She appears well-developed.  HENT:  Head: Normocephalic.  Eyes: Pupils are equal, round, and reactive to light.  Neck: Neck supple.  Cardiovascular: Normal rate.  Exam reveals no gallop and no friction rub.   No murmur heard. Pulmonary/Chest: Breath sounds normal.  Breath sounds clear bilaterally  Patient experiencing shortness of breath or short walk.  Abdominal: Soft. She exhibits no distension. There is no tenderness. There is no rebound.  Musculoskeletal: She exhibits no edema.  Neurological: She is alert and oriented to person, place, and time.  Skin: Skin is warm.  Psychiatric: She has a normal mood and affect.   Cranial nerves III-XII grossly intact Strength 5+/5+ to upper and lower extremities bilaterally with  resistance applied, equal distribution noted Strength intact to MCP, PIP, DIP joints of  hand Negative arm drift Fine motor skills intact Heel to knee down shin normal bilaterally Gait proper, proper balance - negative sway, negative drift, negative step-offs    ED Course  Procedures (including critical care time) Labs Review Labs Reviewed  BASIC METABOLIC PANEL - Abnormal; Notable for the following:    Glucose, Bld 135 (*)    GFR calc non Af Amer 62 (*)    GFR calc Af Amer 72 (*)    Anion gap 17 (*)    All other components within normal limits  CBC  PRO B NATRIURETIC PEPTIDE  I-STAT TROPOININ, ED    Imaging Review Dg Chest 2 View  06/30/2014   CLINICAL DATA:  Shortness of breath  EXAM: CHEST  2 VIEW  COMPARISON:  03/30/2014  FINDINGS: Cardiac shadow is mildly enlarged. The lungs are clear. No vascular congestion is seen. No bony abnormality is noted. The upper abdomen  is within normal limits.  IMPRESSION: Stable cardiomegaly without acute abnormality.   Electronically Signed   By: Inez Catalina M.D.   On: 06/30/2014 18:50     EKG Interpretation None      MDM   Final diagnoses:  SOB (shortness of breath)   43 year old female past medical history of congestive heart failure lupus but presented today with worsening shortness of breath and lightheadedness. On neurological exam patient has no focal neurological deficits but becomes notably short of breath with short walk. Patient evaluated with troponin that was negative, chest x-ray shows stable cardiomegaly, BNP normal echo on 03/03/14- EF 35%-40%. EKG demonstrates nonspecific ST-T wave abnormality.  Patient hemodynamically stable throughout time in the emergency department  Cardiology consult for concern that this was a worsening of her shortness of breath or another representation of cardiac pathology total lupus involvement of her heart. Cardiology came to examine the patient and wanted to admit her for further observation but  the patient refused. Instead the patient will be presenting tomorrow for an event monitor as well as a repeat echo outpatient. Person benefits were discussed with the patient and she was discharged home.    Claudean Severance, MD 07/01/14 [id number]

## 2014-07-01 ENCOUNTER — Encounter (INDEPENDENT_AMBULATORY_CARE_PROVIDER_SITE_OTHER): Payer: 59

## 2014-07-01 ENCOUNTER — Encounter: Payer: 59 | Admitting: Radiology

## 2014-07-01 DIAGNOSIS — R55 Syncope and collapse: Secondary | ICD-10-CM

## 2014-07-01 NOTE — Progress Notes (Signed)
Patient ID: Christine Mcdowell, female   DOB: May 08, 1971, 43 y.o.   MRN: [id number] lifewatch 30 day monitor applied EOS 07-31-14

## 2014-07-01 NOTE — ED Provider Notes (Signed)
I saw and evaluated the patient, reviewed the resident's note and I agree with the findings and plan.   .Face to face Exam:  General:  Awake HEENT:  Atraumatic Resp:  Normal effort Abd:  Nondistended Neuro:No focal weakness  I reviewed EKG with and discussed with resident.  Dot Lanes, MD 07/01/14 0040

## 2014-07-05 ENCOUNTER — Other Ambulatory Visit: Payer: Self-pay | Admitting: *Deleted

## 2014-07-05 DIAGNOSIS — R55 Syncope and collapse: Secondary | ICD-10-CM

## 2014-07-28 ENCOUNTER — Ambulatory Visit: Payer: 59 | Admitting: Physician Assistant

## 2014-08-10 ENCOUNTER — Telehealth: Payer: Self-pay | Admitting: Cardiology

## 2014-08-10 NOTE — Telephone Encounter (Signed)
Pt.notified

## 2014-08-10 NOTE — Telephone Encounter (Signed)
Dr. Kyla Balzarine Interpretation of 30 Day Heart Monitor: NSR, No Arrhythmia. Artifact.

## 2014-08-18 ENCOUNTER — Ambulatory Visit (INDEPENDENT_AMBULATORY_CARE_PROVIDER_SITE_OTHER): Payer: 59 | Admitting: Physician Assistant

## 2014-08-18 ENCOUNTER — Encounter: Payer: Self-pay | Admitting: *Deleted

## 2014-08-18 ENCOUNTER — Other Ambulatory Visit: Payer: Self-pay

## 2014-08-18 ENCOUNTER — Encounter: Payer: Self-pay | Admitting: Physician Assistant

## 2014-08-18 VITALS — BP 130/90 | HR 74 | Ht 66.0 in | Wt 174.0 lb

## 2014-08-18 DIAGNOSIS — R079 Chest pain, unspecified: Secondary | ICD-10-CM

## 2014-08-18 DIAGNOSIS — R0602 Shortness of breath: Secondary | ICD-10-CM

## 2014-08-18 DIAGNOSIS — I428 Other cardiomyopathies: Secondary | ICD-10-CM

## 2014-08-18 DIAGNOSIS — I10 Essential (primary) hypertension: Secondary | ICD-10-CM

## 2014-08-18 DIAGNOSIS — I5022 Chronic systolic (congestive) heart failure: Secondary | ICD-10-CM | POA: Insufficient documentation

## 2014-08-18 LAB — BASIC METABOLIC PANEL
BUN: 14 mg/dL (ref 6–23)
CO2: 27 mEq/L (ref 19–32)
Calcium: 8.9 mg/dL (ref 8.4–10.5)
Chloride: 102 mEq/L (ref 96–112)
Creat: 0.8 mg/dL (ref 0.50–1.10)
Glucose, Bld: 74 mg/dL (ref 70–99)
Potassium: 4 mEq/L (ref 3.5–5.3)
Sodium: 139 mEq/L (ref 135–145)

## 2014-08-18 MED ORDER — TORSEMIDE 10 MG PO TABS: 10.0000 mg | ORAL_TABLET | Freq: Every day | ORAL | Status: DC | PRN

## 2014-08-18 MED ORDER — FUROSEMIDE 40 MG PO TABS: 40.0000 mg | ORAL_TABLET | Freq: Every day | ORAL | Status: DC

## 2014-08-18 NOTE — Patient Instructions (Addendum)
LAB WORK TODAY; BMET, BNP  YOU HAVE A FOLLOW UP WITH DR. Johnsie Cancel 09/20/14 @ 3:45  Your physician has requested that you have en exercise stress myoview. For further information please visit [email or web address]. Please follow instruction sheet, as given.  Your physician has requested that you have an echocardiogram. Echocardiography is a painless test that uses sound waves to create images of your heart. It provides your doctor with information about the size and shape of your heart and how well your heart's chambers and valves are working. This procedure takes approximately one hour. There are no restrictions for this procedure.  Your physician has recommended you make the following change in your medication:  1. INCREASE LASIX 40 MG TO TWICE DAILY FOR 2-3 DAYS THEN GO BACK TO ONE TIME DAILY

## 2014-08-18 NOTE — Progress Notes (Signed)
Cardiology Office Note    Date:  08/18/2014   ID:  Christine Mcdowell, DOB 29-Jul-1971, MRN [id number]  PCP:  Maximino Greenland, MD  Cardiologist:  Dr. Jenkins Rouge      History of Present Illness: Christine Mcdowell is a 43 y.o. female with a hx of advanced lupus with associated myocarditis/cardiomyopathy (dx in 01/2014) with EF 40-45%, HTN, DJD, fibromyalgia, asthma.  She is on chronic prednisone, plaquenil, methotrexate.  Recently dx with DM and started on Invokana.  She was recently seen in the ED by Dr. Sanda Klein for near syncope and palpitations.  BNP was normal.  She was set up for OP event monitor which demonstrated no significant arrhythmias.  She continues to get lightheaded and near syncopal at times.  She notes an elevated HR with this.  She often notes this with exertional activities.  But, has noted it at rest. She also notes chest tightness with activities with associated dyspnea and diaphoresis.  She notes significant DOE and describes NYHA 2b symptoms.  She sleeps on 3 pillows and has had occasional PND.  LE edema is increased and she has been taking Torsemide (to be taken prn) 10 mg QD in addition to Lasix and Lisinopril/HCTZ.  She has not noted much improvement.  She denies syncope.     Studies:  - Echo (3/15):  Mild LVH, EF 35-40%, diff HK, mild MR.  - Cardiac MRI 01/2014:  IMPRESSION: 1) Moderate LVE with diffuse hypokinesis EF 46% 2) T2 weighted images suggest edema in the anterior wall apex and inferolateral walls. This is consistent with lupus myocarditis 2) No early or late gadolinium uptake suggesting no permanent scar tissue or fibrosis 3) LAE 4) MR 5) Small circumferential pericardial effusion    Recent Labs/Images: 06/30/2014: Creatinine 1.06; Hemoglobin 12.7; Potassium 3.8; Pro B Natriuretic peptide (BNP) 13.5   Dg Chest 2 View  06/30/2014   IMPRESSION: Stable cardiomegaly without acute abnormality.   Electronically Signed   By: Inez Catalina M.D.   On: 06/30/2014  18:50     Wt Readings from Last 3 Encounters:  08/18/14 174 lb (78.926 kg)  05/24/14 165 lb 1.6 oz (74.889 kg)  03/30/14 148 lb 6 oz (67.302 kg)     Past Medical History  Diagnosis Date  . Lupus (systemic lupus erythematosus)   . Arthritis   . Hypertension   . Asthma   . Fibromyalgia   . CHF (congestive heart failure)   . 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 albuterol (PROVENTIL HFA;VENTOLIN HFA) 108 (90 BASE) MCG/ACT inhaler Inhale 2 puffs into the lungs every 6 (six) hours as needed for wheezing or shortness of breath.      Marland Kitchen albuterol (PROVENTIL) (2.5 MG/3ML) 0.083% nebulizer solution Take 2.5 mg by nebulization every 6 (six) hours as needed for wheezing or shortness of breath.      Marland Kitchen aspirin 81 MG chewable tablet Chew 162 mg by mouth daily.      . Canagliflozin (INVOKANA) 100 MG TABS Take 1 tablet by mouth daily.      . carvedilol (COREG) 6.25 MG tablet Take 1 tablet (6.25 mg total) by mouth 2 (two) times daily with a meal.  60 tablet  11  . fluconazole (DIFLUCAN) 150 MG tablet Take 150 mg by mouth daily.      . folic acid (FOLVITE) 1 MG tablet Take 1 mg by mouth daily.       Marland Kitchen  furosemide (LASIX) 40 MG tablet Take 1 tablet (40 mg total) by mouth daily.  30 tablet  6  . hydroxychloroquine (PLAQUENIL) 200 MG tablet Take 1 tablet (200 mg total) by mouth daily.  15 tablet  0  . insulin aspart (NOVOLOG FLEXPEN) 100 UNIT/ML FlexPen Inject 2-10 Units into the skin 3 (three) times daily with meals.      . insulin detemir (LEVEMIR) 100 UNIT/ML injection Inject 15-30 Units into the skin 2 (two) times daily. 15 units in the morning and 30 units at night      . levocetirizine (XYZAL) 5 MG tablet Take 5 mg by mouth daily.      Marland Kitchen lisinopril (PRINIVIL,ZESTRIL) 10 MG tablet Take 1 tablet (10 mg total) by mouth daily.  30 tablet  11  . methotrexate (RHEUMATREX) 2.5 MG tablet Take 2.5 mg by mouth every  Tuesday.       . montelukast (SINGULAIR) 10 MG tablet Take 10 mg by mouth daily.      . Multiple Vitamin (MULTIVITAMIN WITH MINERALS) TABS Take 1 tablet by mouth daily.      . potassium chloride SA (K-DUR,KLOR-CON) 20 MEQ tablet Take 2 tablets (40 mEq total) by mouth daily.  60 tablet  11  . predniSONE (DELTASONE) 5 MG tablet Take 15-25 mg by mouth 2 (two) times daily with a meal. Take 25 mg in the morning and 15 mg at night      . saxagliptin HCl (ONGLYZA) 5 MG TABS tablet Take 5 mg by mouth daily.      Marland Kitchen torsemide (DEMADEX) 10 MG tablet Take 1 tablet (10 mg total) by mouth daily as needed (as needed for swelling).  30 tablet  1  . Vitamin D, Ergocalciferol, (DRISDOL) 50000 UNITS CAPS capsule Take 50,000 Units by mouth See admin instructions. Take 1 capsule on Tuesdays and Fridays       No current facility-administered medications for this visit.     Allergies:   Capsaicin; Dilaudid; Erythromycin; and Latex   Social History:  The patient  reports that she has never smoked. She has never used smokeless tobacco. She reports that she does not drink alcohol or use illicit drugs.   Family History:  The patient's family history includes Diabetes in her other; Heart attack in her father and paternal grandmother; Hypertension in her other.   ROS:  Please see the history of present illness.   She has a chronic cough.  She does wheeze.   All other systems reviewed and negative.   PHYSICAL EXAM: VS:  BP 130/90  Pulse 74  Ht 5\' 6"  (1.676 m)  Wt 174 lb (78.926 kg)  BMI 28.10 kg/m2 Well nourished, well developed, in no acute distress HEENT: normal Neck: no JVD Cardiac:  normal S1, S2; RRR; no murmurno rub Lungs:  clear to auscultation bilaterally, no wheezing, rhonchi or rales Abd: soft, nontender, no hepatomegaly Ext: trace bilateral ankle edema Skin: warm and dry Neuro:  CNs 2-12 intact, no focal abnormalities noted  EKG:  NSR, HR 74, NSSTTW changes, septal Q waves, no change from prior  tracing     ASSESSMENT AND PLAN:  Shortness of breath:  Likely multifactorial and related to Lupus as well as systolic CHF and asthma.  She has gained a considerable amount of weight over the last few months.  She has been taking extra Torsemide without improvement in LE edema.  However, by my exam, she does not look that volume overloaded.  I will obtain a BMET  and BNP.  She can take Lasix 40 mg bid for 2-3 days to help with her edema.  If her BNP is high, I will have her remain on Lasix 40 bid.    Chest pain, unspecified:  She now has diabetes.  She has exertional symptoms.  There was consideration for right and left heart cath in 01/2014 when she was in the hospital.  I will arrange an ETT-Myoview to rule out ischemia.  Lupus Cardiomyopathy:  Continue beta blocker and ACEI.  Check BNP as noted above.  I will also repeat echo to reassess LVF and to recheck pericardial effusion.  Chronic systolic heart failure:  Check BNP as noted above and increase Lasix as outlined.   Essential hypertension:  Borderline control.  Continue to monitor.    Disposition:  FU with Dr. Jenkins Rouge in 3-4 week.    Signed, Versie Starks, MHS 08/18/2014 4:52 PM    [facility] [place], [place], [place]  [address] Phone: [phone]; Fax: [phone]

## 2014-08-19 LAB — BRAIN NATRIURETIC PEPTIDE: Brain Natriuretic Peptide: 17.6 pg/mL (ref 0.0–100.0)

## 2014-08-22 ENCOUNTER — Telehealth: Payer: Self-pay | Admitting: *Deleted

## 2014-08-22 NOTE — Telephone Encounter (Signed)
pt notified about lab results with verbal understanding  

## 2014-08-26 ENCOUNTER — Ambulatory Visit (HOSPITAL_BASED_OUTPATIENT_CLINIC_OR_DEPARTMENT_OTHER): Payer: 59 | Admitting: Radiology

## 2014-08-26 ENCOUNTER — Encounter: Payer: Self-pay | Admitting: Physician Assistant

## 2014-08-26 ENCOUNTER — Ambulatory Visit (HOSPITAL_COMMUNITY): Payer: 59 | Attending: Cardiology | Admitting: Radiology

## 2014-08-26 VITALS — BP 129/93 | HR 77 | Ht 66.0 in | Wt 176.0 lb

## 2014-08-26 DIAGNOSIS — I509 Heart failure, unspecified: Secondary | ICD-10-CM | POA: Diagnosis not present

## 2014-08-26 DIAGNOSIS — I517 Cardiomegaly: Secondary | ICD-10-CM | POA: Diagnosis not present

## 2014-08-26 DIAGNOSIS — R079 Chest pain, unspecified: Secondary | ICD-10-CM

## 2014-08-26 DIAGNOSIS — E119 Type 2 diabetes mellitus without complications: Secondary | ICD-10-CM | POA: Diagnosis not present

## 2014-08-26 DIAGNOSIS — I5189 Other ill-defined heart diseases: Secondary | ICD-10-CM | POA: Insufficient documentation

## 2014-08-26 DIAGNOSIS — I10 Essential (primary) hypertension: Secondary | ICD-10-CM | POA: Diagnosis not present

## 2014-08-26 DIAGNOSIS — R0602 Shortness of breath: Secondary | ICD-10-CM

## 2014-08-26 DIAGNOSIS — I428 Other cardiomyopathies: Secondary | ICD-10-CM | POA: Diagnosis not present

## 2014-08-26 DIAGNOSIS — I059 Rheumatic mitral valve disease, unspecified: Secondary | ICD-10-CM | POA: Insufficient documentation

## 2014-08-26 MED ORDER — TECHNETIUM TC 99M SESTAMIBI GENERIC - CARDIOLITE
33.0000 | Freq: Once | INTRAVENOUS | Status: AC | PRN
  Administered 2014-08-26: 33 via INTRAVENOUS

## 2014-08-26 MED ORDER — TECHNETIUM TC 99M SESTAMIBI GENERIC - CARDIOLITE
11.0000 | Freq: Once | INTRAVENOUS | Status: AC | PRN
  Administered 2014-08-26: 11 via INTRAVENOUS

## 2014-08-26 NOTE — Progress Notes (Signed)
Echocardiogram performed.  

## 2014-08-26 NOTE — Progress Notes (Signed)
[facility] [address] [place], [place] [address] [phone]    Cardiology Nuclear Med Study  Christine Mcdowell is a 43 y.o. female     MRN : [id number]     DOB: 12/27/1970  Procedure Date: 08/26/2014  Nuclear Med Background Indication for Stress Test:  Evaluation for Ischemia and Abnormal EKG History:  No known CAD, Asthma Cardiac Risk Factors: Hypertension and IDDM Type 1  Symptoms:  Chest Pain (last date of chest discomfort was two weeks ago), DOE, Palpitations, SOB and Syncope   Nuclear Pre-Procedure Caffeine/Decaff Intake:  None NPO After: 7:00pm   Lungs:  clear O2 Sat: 98% on room air. IV 0.9% NS with Angio Cath:  22g  IV Site: L Hand  IV Started by:  Crissie Figures, RN  Chest Size (in):  36 Cup Size: B  Height: 5\' 6"  (1.676 m)  Weight:  176 lb (79.833 kg)  BMI:  Body mass index is 28.42 kg/(m^2). Tech Comments:  CBG @ 6:30 am = 110    Nuclear Med Study 1 or 2 day study: 1 day  Stress Test Type:  Stress  Reading MD: N/A  Order Authorizing Provider: Jenkins Rouge, MD  Resting Radionuclide: Technetium 76m Sestamibi  Resting Radionuclide Dose: 11.0 mCi   Stress Radionuclide:  Technetium 84m Sestamibi  Stress Radionuclide Dose: 33.0 mCi           Stress Protocol Rest HR: 77 Stress HR: 153  Rest BP: 129/93 Stress BP: 143/72  Exercise Time (min): 8:30 METS: 10.1           Dose of Adenosine (mg):  n/a Dose of Lexiscan: n/a mg  Dose of Atropine (mg): n/a Dose of Dobutamine: n/a mcg/kg/min (at max HR)  Stress Test Technologist: Glade Lloyd, BS-ES  Nuclear Technologist:  Annye Rusk, CNMT     Rest Procedure:  Myocardial perfusion imaging was performed at rest 45 minutes following the intravenous administration of Technetium 63m Sestamibi. Rest ECG: NSR - Normal EKG  Stress Procedure:  The patient exercised on the treadmill utilizing the Bruce Protocol for 8:30 minutes. The patient stopped due to fatigue, SOB and denied any chest  pain.  Technetium 69m Sestamibi was injected at peak exercise and myocardial perfusion imaging was performed after a brief delay. Stress ECG: No significant change from baseline ECG  QPS Raw Data Images:  Patient motion noted. Stress Images:  Normal homogeneous uptake in all areas of the myocardium. Rest Images:  Normal homogeneous uptake in all areas of the myocardium. Subtraction (SDS):  No evidence of ischemia. Transient Ischemic Dilatation (Normal <1.22):  0.89 Lung/Heart Ratio (Normal <0.45):  0.37  Quantitative Gated Spect Images QGS EDV:  103 ml QGS ESV:  50 ml  Impression Exercise Capacity:  Fair exercise capacity. BP Response:  Flat BP response Clinical Symptoms:  Atypical chest pain. ECG Impression:  No significant ST segment change suggestive of ischemia. Comparison with Prior Nuclear Study: No images to compare  Overall Impression:  Normal stress nuclear study.  LV Ejection Fraction: 52%.  LV Wall Motion:  NL LV Function; NL Wall Motion  Jenkins Rouge

## 2014-08-30 ENCOUNTER — Telehealth: Payer: Self-pay | Admitting: *Deleted

## 2014-08-30 NOTE — Telephone Encounter (Signed)
lmptcb for normal myoview results

## 2014-08-30 NOTE — Telephone Encounter (Signed)
pt just returned my call while I was finishing my phone note. Pt notified about normal myoview with verbal understanding.

## 2014-08-31 ENCOUNTER — Encounter (HOSPITAL_COMMUNITY): Payer: Self-pay | Admitting: Emergency Medicine

## 2014-08-31 ENCOUNTER — Emergency Department (HOSPITAL_COMMUNITY)
Admission: EM | Admit: 2014-08-31 | Discharge: 2014-08-31 | Disposition: A | Discharge status: Home / Self Care | Payer: 59 | Attending: Emergency Medicine | Admitting: Emergency Medicine

## 2014-08-31 DIAGNOSIS — R112 Nausea with vomiting, unspecified: Secondary | ICD-10-CM | POA: Insufficient documentation

## 2014-08-31 DIAGNOSIS — J45909 Unspecified asthma, uncomplicated: Secondary | ICD-10-CM | POA: Insufficient documentation

## 2014-08-31 DIAGNOSIS — M129 Arthropathy, unspecified: Secondary | ICD-10-CM | POA: Diagnosis not present

## 2014-08-31 DIAGNOSIS — R51 Headache: Secondary | ICD-10-CM | POA: Diagnosis not present

## 2014-08-31 DIAGNOSIS — I10 Essential (primary) hypertension: Secondary | ICD-10-CM | POA: Insufficient documentation

## 2014-08-31 DIAGNOSIS — Z794 Long term (current) use of insulin: Secondary | ICD-10-CM | POA: Diagnosis not present

## 2014-08-31 DIAGNOSIS — M329 Systemic lupus erythematosus, unspecified: Secondary | ICD-10-CM | POA: Diagnosis present

## 2014-08-31 DIAGNOSIS — Z7982 Long term (current) use of aspirin: Secondary | ICD-10-CM | POA: Diagnosis not present

## 2014-08-31 DIAGNOSIS — Z79899 Other long term (current) drug therapy: Secondary | ICD-10-CM | POA: Insufficient documentation

## 2014-08-31 DIAGNOSIS — E119 Type 2 diabetes mellitus without complications: Secondary | ICD-10-CM | POA: Diagnosis not present

## 2014-08-31 DIAGNOSIS — I509 Heart failure, unspecified: Secondary | ICD-10-CM | POA: Diagnosis not present

## 2014-08-31 DIAGNOSIS — Z91040 Latex allergy status: Secondary | ICD-10-CM | POA: Diagnosis not present

## 2014-08-31 DIAGNOSIS — R21 Rash and other nonspecific skin eruption: Secondary | ICD-10-CM | POA: Insufficient documentation

## 2014-08-31 LAB — COMPREHENSIVE METABOLIC PANEL
ALT: 26 U/L (ref 0–35)
AST: 22 U/L (ref 0–37)
Albumin: 3.9 g/dL (ref 3.5–5.2)
Alkaline Phosphatase: 41 U/L (ref 39–117)
Anion gap: 17 — ABNORMAL HIGH (ref 5–15)
BUN: 18 mg/dL (ref 6–23)
CO2: 23 mEq/L (ref 19–32)
Calcium: 9.3 mg/dL (ref 8.4–10.5)
Chloride: 93 mEq/L — ABNORMAL LOW (ref 96–112)
Creatinine, Ser: 0.82 mg/dL (ref 0.50–1.10)
GFR calc Af Amer: >90 mL/min (ref >90)
GFR calc non Af Amer: 85 mL/min — ABNORMAL LOW (ref >90)
Glucose, Bld: 92 mg/dL (ref 70–99)
Potassium: 4.2 mEq/L (ref 3.7–5.3)
Sodium: 133 mEq/L — ABNORMAL LOW (ref 137–147)
Total Bilirubin: 0.4 mg/dL (ref 0.3–1.2)
Total Protein: 7 g/dL (ref 6.0–8.3)

## 2014-08-31 LAB — URINALYSIS, ROUTINE W REFLEX MICROSCOPIC
Bilirubin Urine: NEGATIVE
Glucose, UA: NEGATIVE mg/dL
Hgb urine dipstick: NEGATIVE
Ketones, ur: 40 mg/dL — AB
Nitrite: NEGATIVE
Protein, ur: 30 mg/dL — AB
Specific Gravity, Urine: 1.025 (ref 1.005–1.030)
Urobilinogen, UA: 0.2 mg/dL (ref 0.0–1.0)
pH: 8.5 — ABNORMAL HIGH (ref 5.0–8.0)

## 2014-08-31 LAB — URINE MICROSCOPIC-ADD ON

## 2014-08-31 LAB — CBC WITH DIFFERENTIAL/PLATELET
Basophils Absolute: 0.1 10*3/uL (ref 0.0–0.1)
Basophils Relative: 1 % (ref 0–1)
Eosinophils Absolute: 0 10*3/uL (ref 0.0–0.7)
Eosinophils Relative: 0 % (ref 0–5)
HCT: 40.8 % (ref 36.0–46.0)
Hemoglobin: 13.3 g/dL (ref 12.0–15.0)
Lymphocytes Relative: 14 % (ref 12–46)
Lymphs Abs: 1.2 10*3/uL (ref 0.7–4.0)
MCH: 28.4 pg (ref 26.0–34.0)
MCHC: 32.6 g/dL (ref 30.0–36.0)
MCV: 87.2 fL (ref 78.0–100.0)
Monocytes Absolute: 0.8 10*3/uL (ref 0.1–1.0)
Monocytes Relative: 10 % (ref 3–12)
Neutro Abs: 6.3 10*3/uL (ref 1.7–7.7)
Neutrophils Relative %: 75 % (ref 43–77)
Platelets: 203 10*3/uL (ref 150–400)
RBC: 4.68 MIL/uL (ref 3.87–5.11)
RDW: 14.3 % (ref 11.5–15.5)
WBC: 8.4 10*3/uL (ref 4.0–10.5)

## 2014-08-31 LAB — CBG MONITORING, ED: Glucose-Capillary: 91 mg/dL (ref 70–99)

## 2014-08-31 MED ORDER — KETOROLAC TROMETHAMINE 30 MG/ML IJ SOLN
30.0000 mg | Freq: Once | INTRAMUSCULAR | Status: AC
  Administered 2014-08-31: 30 mg via INTRAVENOUS
  Filled 2014-08-31: qty 1

## 2014-08-31 MED ORDER — FENTANYL CITRATE 0.05 MG/ML IJ SOLN
100.0000 ug | Freq: Once | INTRAMUSCULAR | Status: AC
  Administered 2014-08-31: 100 ug via INTRAVENOUS
  Filled 2014-08-31: qty 2

## 2014-08-31 MED ORDER — DIPHENHYDRAMINE HCL 50 MG/ML IJ SOLN
25.0000 mg | Freq: Once | INTRAMUSCULAR | Status: AC
  Administered 2014-08-31: 25 mg via INTRAVENOUS
  Filled 2014-08-31: qty 1

## 2014-08-31 MED ORDER — PREDNISONE 20 MG PO TABS
60.0000 mg | ORAL_TABLET | Freq: Once | ORAL | Status: AC
  Administered 2014-08-31: 60 mg via ORAL
  Filled 2014-08-31: qty 3

## 2014-08-31 MED ORDER — ONDANSETRON HCL 4 MG/2ML IJ SOLN
4.0000 mg | Freq: Once | INTRAMUSCULAR | Status: AC
  Administered 2014-08-31: 4 mg via INTRAVENOUS
  Filled 2014-08-31: qty 2

## 2014-08-31 MED ORDER — PREDNISONE 20 MG PO TABS: ORAL_TABLET | ORAL | Status: DC

## 2014-08-31 MED ORDER — SODIUM CHLORIDE 0.9 % IV BOLUS (SEPSIS)
1000.0000 mL | Freq: Once | INTRAVENOUS | Status: AC
  Administered 2014-08-31: 1000 mL via INTRAVENOUS

## 2014-08-31 MED ORDER — ACETAMINOPHEN 500 MG PO TABS
1000.0000 mg | ORAL_TABLET | Freq: Once | ORAL | Status: AC
  Administered 2014-08-31: 1000 mg via ORAL
  Filled 2014-08-31: qty 2

## 2014-08-31 MED ORDER — METOCLOPRAMIDE HCL 5 MG/ML IJ SOLN
10.0000 mg | Freq: Once | INTRAMUSCULAR | Status: AC
  Administered 2014-08-31: 10 mg via INTRAVENOUS
  Filled 2014-08-31: qty 2

## 2014-08-31 MED ORDER — ONDANSETRON 4 MG PO TBDP: ORAL_TABLET | ORAL | Status: DC

## 2014-08-31 NOTE — ED Notes (Signed)
Patient states she starting having a lupus crisis last night. Patient states 10/10 pain all over. Patient states she has been vomiting x5 last night and dry heaving this morning. Patient states she has not taken her insulin today last dose 30 units last night a 10pm

## 2014-08-31 NOTE — ED Provider Notes (Signed)
CSN: [id number]     Arrival date & time 08/31/14  2353 History   First MD Initiated Contact with Patient 08/31/14 [phone]     Chief Complaint  Patient presents with  . Lupus     (Consider location/radiation/quality/duration/timing/severity/associated sxs/prior Treatment) HPI 43 year old female with a history of lupus presents with a lupus flare since yesterday morning. Patient states started at work and she been having diffuse pain. The pain is localized as a headache and she states all of her joints and bones hurt. This is what she would consider a severe lupus flare. She's also been having vomiting since last night and this is new compared to her normal lupus flares. She's not felt like she's had a fever. She thinks she's developed a rash on her back which is typical for her flares. She denies any cough or shortness of breath. She's not having any abdominal pain besides when vomiting. Denies any urinary symptoms. Denies that her joints are swollen just that they hurt. She states she was taking her medicine up until this morning but can't because of vomiting. She does not know the name of her pain meds.  Past Medical History  Diagnosis Date  . Lupus (systemic lupus erythematosus)   . Arthritis   . Hypertension   . Asthma   . Fibromyalgia   . CHF (congestive heart failure)   . 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 Hx of echocardiogram     Echo (9/15):  EF 50-55%, ant HK, Gr 1 DD, mild MR, mild LAE, no effusion  . Hx of cardiovascular stress test     ETT-Myoview (9/15):  No ischemia, EF 52%; NORMAL   Past Surgical History  Procedure Laterality Date  . Knee surgery    . Tubal ligation    . Abdominal hysterectomy  2009   Family History  Problem Relation Age of Onset  . Diabetes Other   . Hypertension Other   . Heart attack Father   . Heart attack Paternal Grandmother    History  Substance Use Topics  . Smoking status: Never Smoker   . Smokeless tobacco:  Never Used  . Alcohol Use: No   OB History   Grav Para Term Preterm Abortions TAB SAB Ect Mult Living                 Review of Systems  Constitutional: Negative for fever.  Respiratory: Negative for shortness of breath.   Cardiovascular: Negative for chest pain.  Gastrointestinal: Positive for nausea and vomiting. Negative for abdominal pain.  Genitourinary: Negative for dysuria.  Musculoskeletal: Positive for arthralgias.  Skin: Positive for rash.  Neurological: Positive for headaches.  All other systems reviewed and are negative.     Allergies  Capsaicin; Dilaudid; Erythromycin; and Latex  Home Medications   Prior to Admission medications   Medication Sig Start Date End Date Taking? Authorizing Provider  albuterol (PROVENTIL HFA;VENTOLIN HFA) 108 (90 BASE) MCG/ACT inhaler Inhale 2 puffs into the lungs every 6 (six) hours as needed for wheezing or shortness of breath.    Historical Provider, MD  albuterol (PROVENTIL) (2.5 MG/3ML) 0.083% nebulizer solution Take 2.5 mg by nebulization every 6 (six) hours as needed for wheezing or shortness of breath.    Historical Provider, MD  aspirin 81 MG chewable tablet Chew 162 mg by mouth daily.    Historical Provider, MD  Canagliflozin (INVOKANA) 100 MG TABS Take 1 tablet by mouth daily.    Historical Provider,  MD  carvedilol (COREG) 6.25 MG tablet Take 1 tablet (6.25 mg total) by mouth 2 (two) times daily with a meal. 02/28/14   Josue Hector, MD  fluconazole (DIFLUCAN) 150 MG tablet Take 150 mg by mouth daily.    Historical Provider, MD  folic acid (FOLVITE) 1 MG tablet Take 1 mg by mouth daily.     Historical Provider, MD  furosemide (LASIX) 40 MG tablet Take 1 tablet (40 mg total) by mouth daily. 08/18/14   Liliane Shi, PA-C  hydroxychloroquine (PLAQUENIL) 200 MG tablet Take 1 tablet (200 mg total) by mouth daily. 01/30/14   [name], MD  insulin aspart (NOVOLOG FLEXPEN) 100 UNIT/ML FlexPen Inject 2-10 Units into the skin 3  (three) times daily with meals.    Historical Provider, MD  insulin detemir (LEVEMIR) 100 UNIT/ML injection Inject 15-30 Units into the skin 2 (two) times daily. 15 units in the morning and 30 units at night    Historical Provider, MD  levocetirizine (XYZAL) 5 MG tablet Take 5 mg by mouth daily.    Historical Provider, MD  lisinopril (PRINIVIL,ZESTRIL) 10 MG tablet Take 1 tablet (10 mg total) by mouth daily. 02/28/14   Josue Hector, MD  methotrexate (RHEUMATREX) 2.5 MG tablet Take 2.5 mg by mouth every Tuesday.     Historical Provider, MD  montelukast (SINGULAIR) 10 MG tablet Take 10 mg by mouth daily.    Historical Provider, MD  Multiple Vitamin (MULTIVITAMIN WITH MINERALS) TABS Take 1 tablet by mouth daily.    Historical Provider, MD  potassium chloride SA (K-DUR,KLOR-CON) 20 MEQ tablet Take 2 tablets (40 mEq total) by mouth daily. 02/28/14   Josue Hector, MD  predniSONE (DELTASONE) 5 MG tablet Take 15-25 mg by mouth 2 (two) times daily with a meal. Take 25 mg in the morning and 15 mg at night    Historical Provider, MD  saxagliptin HCl (ONGLYZA) 5 MG TABS tablet Take 5 mg by mouth daily.    Historical Provider, MD  torsemide (DEMADEX) 10 MG tablet Take 1 tablet (10 mg total) by mouth daily as needed (as needed for swelling). 08/18/14   Josue Hector, MD  Vitamin D, Ergocalciferol, (DRISDOL) 50000 UNITS CAPS capsule Take 50,000 Units by mouth See admin instructions. Take 1 capsule on Tuesdays and Fridays    Historical Provider, MD   BP 121/78  Pulse 103  Temp(Src) 99.3 F (37.4 C) (Oral)  Resp 20  SpO2 100% Physical Exam  Nursing note and vitals reviewed. Constitutional: She is oriented to person, place, and time. She appears well-developed and well-nourished.  HENT:  Head: Normocephalic and atraumatic.  Right Ear: External ear normal.  Left Ear: External ear normal.  Nose: Nose normal.  Eyes: Right eye exhibits no discharge. Left eye exhibits no discharge.  Cardiovascular: Normal rate,  regular rhythm and normal heart sounds.   Pulmonary/Chest: Effort normal and breath sounds normal. She has no wheezes. She has no rales.  Abdominal: Soft. She exhibits no distension. There is no tenderness.  Musculoskeletal: She exhibits tenderness. She exhibits no edema.  Tenderness with light touch over most of her body. No joint swelling or effusions appreciated in large joints such as shoulder, elbow and knee. No midline back tenderness  Neurological: She is alert and oriented to person, place, and time.  Skin: Skin is warm and dry. Rash noted.  Back has multiple areas of flat skin discoloration    ED Course  Procedures (including critical care time)  Labs Review Labs Reviewed  COMPREHENSIVE METABOLIC PANEL - Abnormal; Notable for the following:    Sodium 133 (*)    Chloride 93 (*)    GFR calc non Af Amer 85 (*)    Anion gap 17 (*)    All other components within normal limits  URINALYSIS, ROUTINE W REFLEX MICROSCOPIC - Abnormal; Notable for the following:    APPearance CLOUDY (*)    pH 8.5 (*)    Ketones, ur 40 (*)    Protein, ur 30 (*)    Leukocytes, UA SMALL (*)    All other components within normal limits  URINE MICROSCOPIC-ADD ON - Abnormal; Notable for the following:    Squamous Epithelial / LPF FEW (*)    Bacteria, UA FEW (*)    Crystals TRIPLE PHOSPHATE CRYSTALS (*)    All other components within normal limits  CBC WITH DIFFERENTIAL  CBG MONITORING, ED    Imaging Review No results found.   EKG Interpretation None      MDM   Final diagnoses:  Lupus    Patient relates that her headache is consistent with her multiple prior migraines. This resolved after Reglan and Benadryl. She also given Toradol and fentanyl. The portal seem to have the most effect on her generalized joint pain. She has no red flag symptoms such as fevers, elevated white blood cell count, or obvious infection. As her pain significantly improved the patient likely discharge. She is currently  on low-dose prednisone at home, will order a taper on top of this given that she's having an acute flare. She will followup with her rheumatologist closely as an outpatient.    Ephraim Hamburger, MD 08/31/14 [phone]

## 2014-08-31 NOTE — Discharge Instructions (Signed)
Christine Mcdowell (also called systemic Christine erythematosus, SLE) is a disorder of the body's natural defense system (immune system). In Christine, the immune system attacks various areas of the body (autoimmune disease). CAUSES The cause is unknown. However, Christine runs in families. Certain genes can make you more likely to develop Christine. It is 10 times more common in women than in men. Christine is also more common in African Americans and Asians. Other factors also play a role, such as viruses (Epstein-Barr virus, EBV), stress, hormones, cigarette smoke, and certain drugs. SYMPTOMS Christine can affect many parts of the body, including the joints, skin, kidneys, lungs, heart, nervous system, and blood vessels. The signs and symptoms of Christine differ from person to person. The disease can range from mild to life-threatening. Typical features of Christine include:  Butterfly-shaped rash over the face.  Arthritis involving one or more joints.  Kidney disease.  Fever, weight loss, hair loss, fatigue.  Poor circulation in the fingers and toes (Raynaud's disease).  Chest pain when taking deep breaths. Abdominal pain may also occur.  Skin rash in areas exposed to the sun.  Sores in the mouth and nose. DIAGNOSIS Diagnosing Christine can take a long time and is often difficult. An exam and an accurate account of your symptoms and health problems is very important. Blood tests are necessary, though no single test can confirm or rule out Christine. Most people with Christine test positive for antinuclear antibodies (ANA) on a blood test. Additional blood tests, a urine test (urinalysis), and sometimes a kidney or skin tissue sample (biopsy) can help to confirm or rule out Christine. TREATMENT There is no cure for Christine. Your caregiver will develop a treatment plan based on your age, sex, health, symptoms, and lifestyle. The goals are to prevent flares, to treat them when they do occur, and to minimize organ damage and complications. How  the disease may affect each person varies widely. Most people with Christine can live normal lives, but this disorder must be carefully monitored. Treatment must be adjusted as necessary to prevent serious complications. Medicines used for treatment:  Nonsteroidal anti-inflammatory drugs (NSAIDs) decrease inflammation and can help with chest pain, joint pain, and fevers. Examples include ibuprofen and naproxen.  Antimalarial drugs were designed to treat malaria. They also treat fatigue, joint pain, skin rashes, and inflammation of the lungs in patients with Christine.  Corticosteroids are powerful hormones that rapidly suppress inflammation. The lowest dose with the highest benefit will be chosen. They can be given by cream, pills, injections, and through the vein (intravenously).  Immunosuppressive drugs block the making of immune cells. They may be used for kidney or nerve disease. HOME CARE INSTRUCTIONS  Exercise. Low-impact activities can usually help keep joints flexible without being too strenuous.  Rest after periods of exercise.  Avoid excessive sun exposure.  Follow proper nutrition and take supplements as recommended by your caregiver.  Stress management can be helpful. SEEK MEDICAL CARE IF:  You have increased fatigue.  You develop pain.  You develop a rash.  You have an oral temperature above 102 F (38.9 C).  You develop abdominal discomfort.  You develop a headache.  You experience dizziness. [place] of Neurological Disorders and Stroke: [employer] of Rheumatology: [email or web address].[email or web address] of Arthritis and Musculoskeletal and Skin Diseases: [email or web address].niams.SouthEx[employer].es Document Released: 11/29/2002 Document Revised: 03/02/2012 Document Reviewed: 03/22/2010 [facility] Patient Information 2015 [place], [place]. This information is not intended to replace advice given to you by your health  care provider. Make sure  you discuss any questions you have with your health care provider.    General Headache Without Cause A headache is pain or discomfort felt around the head or neck area. The specific cause of a headache may not be found. There are many causes and types of headaches. A few common ones are:  Tension headaches.  Migraine headaches.  Cluster headaches.  Chronic daily headaches. HOME CARE INSTRUCTIONS   Keep all follow-up appointments with your caregiver or any specialist referral.  Only take over-the-counter or prescription medicines for pain or discomfort as directed by your caregiver.  Lie down in a dark, quiet room when you have a headache.  Keep a headache journal to find out what may trigger your migraine headaches. For example, write down:  What you eat and drink.  How much sleep you get.  Any change to your diet or medicines.  Try massage or other relaxation techniques.  Put ice packs or heat on the head and neck. Use these 3 to 4 times per day for 15 to 20 minutes each time, or as needed.  Limit stress.  Sit up straight, and do not tense your muscles.  Quit smoking if you smoke.  Limit alcohol use.  Decrease the amount of caffeine you drink, or stop drinking caffeine.  Eat and sleep on a regular schedule.  Get 7 to 9 hours of sleep, or as recommended by your caregiver.  Keep lights dim if bright lights bother you and make your headaches worse. SEEK MEDICAL CARE IF:   You have problems with the medicines you were prescribed.  Your medicines are not working.  You have a change from the usual headache.  You have nausea or vomiting. SEEK IMMEDIATE MEDICAL CARE IF:   Your headache becomes severe.  You have a fever.  You have a stiff neck.  You have loss of vision.  You have muscular weakness or loss of muscle control.  You start losing your balance or have trouble walking.  You feel faint or pass out.  You have severe symptoms that are different  from your first symptoms. MAKE SURE YOU:   Understand these instructions.  Will watch your condition.  Will get help right away if you are not doing well or get worse. Document Released: 12/09/2005 Document Revised: 03/02/2012 Document Reviewed: 12/25/2011 [facility] Patient Information 2015 [place], [place]. This information is not intended to replace advice given to you by your health care provider. Make sure you discuss any questions you have with your health care provider.    Arthralgia Your caregiver has diagnosed you as suffering from an arthralgia. Arthralgia means there is pain in a joint. This can come from many reasons including:  Bruising the joint which causes soreness (inflammation) in the joint.  Wear and tear on the joints which occur as we grow older (osteoarthritis).  Overusing the joint.  Various forms of arthritis.  Infections of the joint. Regardless of the cause of pain in your joint, most of these different pains respond to anti-inflammatory drugs and rest. The exception to this is when a joint is infected, and these cases are treated with antibiotics, if it is a bacterial infection. HOME CARE INSTRUCTIONS   Rest the injured area for as long as directed by your caregiver. Then slowly start using the joint as directed by your caregiver and as the pain allows. Crutches as directed may be useful if the ankles, knees or hips are involved. If the knee was  splinted or casted, continue use and care as directed. If an stretchy or elastic wrapping bandage has been applied today, it should be removed and re-applied every 3 to 4 hours. It should not be applied tightly, but firmly enough to keep swelling down. Watch toes and feet for swelling, bluish discoloration, coldness, numbness or excessive pain. If any of these problems (symptoms) occur, remove the ace bandage and re-apply more loosely. If these symptoms persist, contact your caregiver or return to this location.  For the  first 24 hours, keep the injured extremity elevated on pillows while lying down.  Apply ice for 15-20 minutes to the sore joint every couple hours while awake for the first half day. Then 03-04 times per day for the first 48 hours. Put the ice in a plastic bag and place a towel between the bag of ice and your skin.  Wear any splinting, casting, elastic bandage applications, or slings as instructed.  Only take over-the-counter or prescription medicines for pain, discomfort, or fever as directed by your caregiver. Do not use aspirin immediately after the injury unless instructed by your physician. Aspirin can cause increased bleeding and bruising of the tissues.  If you were given crutches, continue to use them as instructed and do not resume weight bearing on the sore joint until instructed. Persistent pain and inability to use the sore joint as directed for more than 2 to 3 days are warning signs indicating that you should see a caregiver for a follow-up visit as soon as possible. Initially, a hairline fracture (break in bone) may not be evident on X-rays. Persistent pain and swelling indicate that further evaluation, non-weight bearing or use of the joint (use of crutches or slings as instructed), or further X-rays are indicated. X-rays may sometimes not show a small fracture until a week or 10 days later. Make a follow-up appointment with your own caregiver or one to whom we have referred you. A radiologist (specialist in reading X-rays) may read your X-rays. Make sure you know how you are to obtain your X-ray results. Do not assume everything is normal if you do not hear from [place]. SEEK MEDICAL CARE IF: Bruising, swelling, or pain increases. SEEK IMMEDIATE MEDICAL CARE IF:   Your fingers or toes are numb or blue.  The pain is not responding to medications and continues to stay the same or get worse.  The pain in your joint becomes severe.  You develop a fever over 102 F (38.9 C).  It becomes  impossible to move or use the joint. MAKE SURE YOU:   Understand these instructions.  Will watch your condition.  Will get help right away if you are not doing well or get worse. Document Released: 12/09/2005 Document Revised: 03/02/2012 Document Reviewed: 07/27/2008 [facility] Patient Information 2015 [place], [place]. This information is not intended to replace advice given to you by your health care provider. Make sure you discuss any questions you have with your health care provider.

## 2014-08-31 NOTE — ED Notes (Signed)
Patient unable to urinate a this time

## 2014-08-31 NOTE — ED Notes (Signed)
Pt reports she feels a lot better and is hungry.

## 2014-08-31 NOTE — ED Notes (Signed)
Bed: VZ56 Expected date:  Expected time:  Means of arrival:  Comments: Sickle cell crisis

## 2014-08-31 NOTE — ED Notes (Addendum)
Pt ambulated to br,attempted to provide urine sample, no success. Dr Regenia Skeeter aware.

## 2014-09-20 ENCOUNTER — Encounter: Payer: Self-pay | Admitting: *Deleted

## 2014-09-20 ENCOUNTER — Encounter: Payer: Self-pay | Admitting: Cardiovascular Disease

## 2014-09-20 ENCOUNTER — Ambulatory Visit (INDEPENDENT_AMBULATORY_CARE_PROVIDER_SITE_OTHER): Payer: 59 | Admitting: Cardiovascular Disease

## 2014-09-20 VITALS — BP 104/58 | HR 90 | Ht 66.0 in | Wt 176.1 lb

## 2014-09-20 DIAGNOSIS — I428 Other cardiomyopathies: Secondary | ICD-10-CM

## 2014-09-20 DIAGNOSIS — I420 Dilated cardiomyopathy: Secondary | ICD-10-CM

## 2014-09-20 DIAGNOSIS — M329 Systemic lupus erythematosus, unspecified: Secondary | ICD-10-CM

## 2014-09-20 NOTE — Patient Instructions (Signed)
Your physician wants you to follow-up in:  6 MONTHS WITH DR NISHAN  You will receive a reminder letter in the mail two months in advance. If you don't receive a letter, please call our office to schedule the follow-up appointment. Your physician recommends that you continue on your current medications as directed. Please refer to the Current Medication list given to you today. 

## 2014-09-20 NOTE — Assessment & Plan Note (Signed)
EF recovered no cardiac symptoms continue current dose of ACE and diuretic

## 2014-09-20 NOTE — Assessment & Plan Note (Signed)
Seems to be getting better w/u at [place] with trying to get prednisone down as it really makes her BS hard to control and she does not lke the cushingoid appearence

## 2014-09-20 NOTE — Progress Notes (Signed)
Patient ID: Christine Mcdowell, female   DOB: [birth year], 43 y.o.   MRN: [id number] 43 yo with bad systemic lupus and asthma with restrictive lung disease  History of acute myopericarditis Rx with high dose steroids in 2015  MRI  IMPRESSION:  2/15 1) Moderate LVE with diffuse hypokinesis EF 46%  2) T2 weighted images suggest edema in the anterior wall apex and  inferolateral walls. This is consistent with lupus myocarditis  2) No early or late gadolinium uptake suggesting no permanent scar  tissue or fibrosis  3) LAE  4) MR  5) Small circumferential pericardial effusion  F/U echo 9/15  EF 50-55%  Study Conclusions  - Left ventricle: The cavity size was normal. Systolic function was normal. The estimated ejection fraction was in the range of 50% to 55%. There is hypokinesis of the anterior myocardium. Doppler parameters are consistent with abnormal left ventricular relaxation (grade 1 diastolic dysfunction). Global lateral strain = -13.6% - Mitral valve: There was mild regurgitation. - Left atrium: The atrium was mildly dilated.  Myovue 9/4 normal with no ischemia  Seeing [name] at [facility] now.  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  More hand pain.  Less dyspnea.      ROS: Denies fever, malais, weight loss, blurry vision, decreased visual acuity, cough, sputum, SOB, hemoptysis, pleuritic pain, palpitaitons, heartburn, abdominal pain, melena, lower extremity edema, claudication, or rash.  All other systems reviewed and negative  General: Affect appropriate Cushingoid black female  HEENT: normal Neck supple with no adenopathy JVP normal no bruits no thyromegaly Lungs clear with no wheezing and good diaphragmatic motion Heart:  S1/S2 no murmur, no rub, gallop or click PMI normal Abdomen: benighn, BS positve, no tenderness, no AAA no bruit.  No HSM or HJR Distal  pulses intact with no bruits No edema Neuro non-focal Skin warm and dry No muscular weakness   Current Outpatient Prescriptions  Medication Sig Dispense Refill  . albuterol (PROVENTIL HFA;VENTOLIN HFA) 108 (90 BASE) MCG/ACT inhaler Inhale 2 puffs into the lungs every 6 (six) hours as needed for wheezing or shortness of breath.      Marland Kitchen albuterol (PROVENTIL) (2.5 MG/3ML) 0.083% nebulizer solution Take 2.5 mg by nebulization every 6 (six) hours as needed for wheezing or shortness of breath.      Marland Kitchen aspirin 81 MG chewable tablet Chew 162 mg by mouth every morning.       . carvedilol (COREG) 6.25 MG tablet Take 1 tablet (6.25 mg total) by mouth 2 (two) times daily with a meal.  60 tablet  11  . fluconazole (DIFLUCAN) 150 MG tablet Take 150 mg by mouth every morning.       . folic acid (FOLVITE) 1 MG tablet Take 1 mg by mouth every morning.       . furosemide (LASIX) 40 MG tablet Take 40 mg by mouth every morning.      . hydroxychloroquine (PLAQUENIL) 200 MG tablet Take 200 mg by mouth every morning.      . insulin aspart (NOVOLOG FLEXPEN) 100 UNIT/ML FlexPen Inject 2-10 Units into the skin 3 (three) times daily with meals. SLIDING SCALE      . insulin detemir (LEVEMIR) 100 UNIT/ML injection Inject 15-30 Units into the skin 2 (two) times daily. She takes 15 units in the morning and 30 units at bedtime.      Marland Kitchen  levocetirizine (XYZAL) 5 MG tablet Take 5 mg by mouth every morning.       Marland Kitchen lisinopril (PRINIVIL,ZESTRIL) 10 MG tablet Take 10 mg by mouth every morning.      . methotrexate (RHEUMATREX) 2.5 MG tablet Take 10 mg by mouth every Tuesday.       . montelukast (SINGULAIR) 10 MG tablet Take 10 mg by mouth at bedtime.       . Multiple Vitamin (MULTIVITAMIN WITH MINERALS) TABS Take 1 tablet by mouth every morning.       . ondansetron (ZOFRAN ODT) 4 MG disintegrating tablet 4mg  ODT q4 hours prn nausea/vomit  10 tablet  0  . potassium chloride SA (K-DUR,KLOR-CON) 20 MEQ tablet Take 40 mEq by mouth every  morning.      . predniSONE (DELTASONE) 20 MG tablet 3 tabs po daily x 3 days, then 2 tabs x 3 days, then 1.5 tabs x 3 days, then 1 tab x 3 days, then 0.5 tabs x 3 days  27 tablet  0  . predniSONE (DELTASONE) 5 MG tablet Take 15-25 mg by mouth 2 (two) times daily with a meal. She takes 25 mg in the morning and 15 mg at night.      . saxagliptin HCl (ONGLYZA) 5 MG TABS tablet Take 5 mg by mouth every morning.       . torsemide (DEMADEX) 10 MG tablet Take 10 mg by mouth daily as needed (For swelling.).      Marland Kitchen Vitamin D, Ergocalciferol, (DRISDOL) 50000 UNITS CAPS capsule Take 50,000 Units by mouth See admin instructions. She takes on Tuesdays and Fridays.       No current facility-administered medications for this visit.    Allergies  Dilaudid; Erythromycin; and Latex  Electrocardiogram:  SR rate 74 normal   Assessment and Plan

## 2014-09-26 ENCOUNTER — Ambulatory Visit: Payer: 59

## 2014-10-06 DIAGNOSIS — M351 Other overlap syndromes: Secondary | ICD-10-CM | POA: Insufficient documentation

## 2014-10-27 ENCOUNTER — Other Ambulatory Visit: Payer: Self-pay | Admitting: Cardiovascular Disease

## 2014-11-05 ENCOUNTER — Other Ambulatory Visit: Payer: Self-pay | Admitting: Cardiovascular Disease

## 2014-11-22 ENCOUNTER — Other Ambulatory Visit: Payer: Self-pay | Admitting: Cardiovascular Disease

## 2015-02-01 NOTE — Progress Notes (Signed)
Patient ID: Christine Mcdowell, female   DOB: 1971/04/26, 44 y.o.   MRN: [id number] 44 y.o.  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 This is consistent with lupus myocarditis  2) No early or late gadolinium uptake suggesting no permanent scar  tissue or fibrosis  3) LAE  4) MR  5) Small circumferential pericardial effusion  F/U echo 9/15  EF 50-55%  Study Conclusions  - Left ventricle: The cavity size was normal. Systolic function was normal. The estimated ejection fraction was in the range of 50% to 55%. There is hypokinesis of the anterior myocardium. Doppler parameters are consistent with abnormal left ventricular relaxation (grade 1 diastolic dysfunction). Global lateral strain = -13.6% - Mitral valve: There was mild regurgitation. - Left atrium: The atrium was mildly dilated.  Myovue 9/4 normal with no ischemia  Seeing [name] at [facility] now.  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  Now on retuxan and prednisone lowered to 20 mg over last week   Since trying to do this more swelling in hands , legs  More dyspnea.  Rheum concerned about her heart  Mild atypical pains in chest again with job stress     ROS: Denies fever, malais, weight loss, blurry vision, decreased visual acuity, cough, sputum, SOB, hemoptysis, pleuritic pain, palpitaitons, heartburn, abdominal pain, melena, lower extremity edema, claudication, or rash.  All other systems reviewed and negative  General: Affect appropriate Cushingoid black female  HEENT: normal Neck supple with no adenopathy JVP normal no bruits no thyromegaly Lungs clear with no  wheezing and good diaphragmatic motion Heart:  S1/S2 no murmur, no rub, gallop or click PMI normal Abdomen: benighn, BS positve, no tenderness, no AAA no bruit.  No HSM or HJR Distal pulses intact with no bruits No edema Neuro non-focal Skin warm and dry No muscular weakness   Current Outpatient Prescriptions  Medication Sig Dispense Refill  . albuterol (PROVENTIL HFA;VENTOLIN HFA) 108 (90 BASE) MCG/ACT inhaler Inhale 2 puffs into the lungs every 6 (six) hours as needed for wheezing or shortness of breath.    Marland Kitchen albuterol (PROVENTIL) (2.5 MG/3ML) 0.083% nebulizer solution Take 2.5 mg by nebulization every 6 (six) hours as needed for wheezing or shortness of breath.    Marland Kitchen aspirin 81 MG chewable tablet Chew 162 mg by mouth every morning.     . carvedilol (COREG) 6.25 MG tablet Take 1 tablet (6.25 mg total) by mouth 2 (two) times daily with a meal. 60 tablet 11  . fluconazole (DIFLUCAN) 150 MG tablet Take 150 mg by mouth every morning.     . folic acid (FOLVITE) 1 MG tablet Take 1 mg by mouth every morning.     . furosemide (LASIX) 40 MG tablet Take 40 mg by mouth every morning.    . hydroxychloroquine (PLAQUENIL) 200 MG tablet Take 200 mg by mouth every morning.    . insulin aspart (NOVOLOG FLEXPEN) 100 UNIT/ML FlexPen Inject 2-10 Units into the skin 3 (three) times daily with meals. SLIDING SCALE    . insulin detemir (LEVEMIR) 100 UNIT/ML injection Inject 15-30 Units into the skin 2 (two)  times daily. She takes 15 units in the morning and 30 units at bedtime.    Marland Kitchen levocetirizine (XYZAL) 5 MG tablet Take 5 mg by mouth every morning.     Marland Kitchen lisinopril (PRINIVIL,ZESTRIL) 10 MG tablet Take 10 mg by mouth every morning.    Marland Kitchen lisinopril-hydrochlorothiazide (PRINZIDE,ZESTORETIC) 10-12.5 MG per tablet Take 1 tablet by mouth as needed. TAKE WHEN RETAINING FLUID    . methotrexate (RHEUMATREX) 2.5 MG tablet Take 2.5 mg by mouth every Tuesday.     . montelukast (SINGULAIR) 10 MG tablet Take 10 mg by  mouth at bedtime.     . Multiple Vitamin (MULTIVITAMIN WITH MINERALS) TABS Take 1 tablet by mouth every morning.     . ondansetron (ZOFRAN ODT) 4 MG disintegrating tablet 4mg  ODT q4 hours prn nausea/vomit 10 tablet 0  . potassium chloride SA (K-DUR,KLOR-CON) 20 MEQ tablet Take 40 mEq by mouth every morning.    . predniSONE (DELTASONE) 20 MG tablet 3 tabs po daily x 3 days, then 2 tabs x 3 days, then 1.5 tabs x 3 days, then 1 tab x 3 days, then 0.5 tabs x 3 days 27 tablet 0  . saxagliptin HCl (ONGLYZA) 5 MG TABS tablet Take 5 mg by mouth every morning.     . torsemide (DEMADEX) 10 MG tablet TAKE 1 TABLET (10 MG TOTAL) BY MOUTH DAILY AS NEEDED (AS NEEDED FOR SWELLING). 30 tablet 3  . TRAZODONE HCL PO Take by mouth at bedtime.    . Vitamin D, Ergocalciferol, (DRISDOL) 50000 UNITS CAPS capsule Take 50,000 Units by mouth See admin instructions. She takes on Tuesdays and Fridays.     No current facility-administered medications for this visit.    Allergies  Dilaudid; Erythromycin; and Latex  Electrocardiogram:  SR rate 74 normal   Assessment and Plan

## 2015-02-03 ENCOUNTER — Ambulatory Visit (INDEPENDENT_AMBULATORY_CARE_PROVIDER_SITE_OTHER): Payer: 59 | Admitting: Cardiovascular Disease

## 2015-02-03 ENCOUNTER — Encounter: Payer: Self-pay | Admitting: *Deleted

## 2015-02-03 ENCOUNTER — Encounter: Payer: Self-pay | Admitting: Cardiovascular Disease

## 2015-02-03 VITALS — BP 120/78 | HR 81 | Ht 66.5 in | Wt 178.1 lb

## 2015-02-03 DIAGNOSIS — I420 Dilated cardiomyopathy: Secondary | ICD-10-CM

## 2015-02-03 DIAGNOSIS — Z8679 Personal history of other diseases of the circulatory system: Secondary | ICD-10-CM

## 2015-02-03 DIAGNOSIS — R0600 Dyspnea, unspecified: Secondary | ICD-10-CM

## 2015-02-03 DIAGNOSIS — M329 Systemic lupus erythematosus, unspecified: Secondary | ICD-10-CM

## 2015-02-03 DIAGNOSIS — Z79899 Other long term (current) drug therapy: Secondary | ICD-10-CM

## 2015-02-03 LAB — BASIC METABOLIC PANEL
BUN: 24 mg/dL — ABNORMAL HIGH (ref 6–23)
CO2: 36 mEq/L — ABNORMAL HIGH (ref 19–32)
Calcium: 9.2 mg/dL (ref 8.4–10.5)
Chloride: 101 mEq/L (ref 96–112)
Creatinine, Ser: 0.86 mg/dL (ref 0.40–1.20)
GFR: 91.33 mL/min (ref >60.00)
Glucose, Bld: 98 mg/dL (ref 70–99)
Potassium: 3.2 mEq/L — ABNORMAL LOW (ref 3.5–5.1)
Sodium: 141 mEq/L (ref 135–145)

## 2015-02-03 LAB — SEDIMENTATION RATE: Sed Rate: 7 mm/hr (ref 0–22)

## 2015-02-03 LAB — BRAIN NATRIURETIC PEPTIDE: Pro B Natriuretic peptide (BNP): 90 pg/mL (ref 0.0–100.0)

## 2015-02-03 NOTE — Assessment & Plan Note (Signed)
F/U Rheum  Scheduled for 2 more retuxan injections  On triple Rx now and trying to minimize prednisone due to weight gain and DM

## 2015-02-03 NOTE — Assessment & Plan Note (Signed)
Suspect her symptoms are from lupus and steroid taper more than recurrent DCM  Continue current dose of lasix check BMET and BNP Will try to schedule cardiac MRI with gad Monday to check EF, pericardium and any evidence of recurrent myocarditis.  Caridac exam not consistent with pericardial disease or recurrent CHF

## 2015-02-03 NOTE — Patient Instructions (Addendum)
Your physician recommends that you schedule a follow-up appointment in: 2-3  [place]  Your physician recommends that you continue on your current medications as directed. Please refer to the Current Medication list given to you today.   Your physician recommends that you return for lab work in:  [place] has requested that you have a cardiac MRI. Cardiac MRI uses a computer to create images of your heart as its beating, producing both still and moving pictures of your heart and major blood vessels. For further information please visit [email or web address] Please follow the instruction sheet given to you today for more information. BEGINNING OF WEEK   MON OR TUES

## 2015-02-08 ENCOUNTER — Other Ambulatory Visit: Payer: Self-pay | Admitting: *Deleted

## 2015-02-08 DIAGNOSIS — E876 Hypokalemia: Secondary | ICD-10-CM

## 2015-02-08 MED ORDER — POTASSIUM CHLORIDE CRYS ER 20 MEQ PO TBCR: EXTENDED_RELEASE_TABLET | ORAL | Status: DC

## 2015-02-16 ENCOUNTER — Telehealth: Payer: Self-pay | Admitting: *Deleted

## 2015-02-16 DIAGNOSIS — I5040 Unspecified combined systolic (congestive) and diastolic (congestive) heart failure: Secondary | ICD-10-CM

## 2015-02-16 NOTE — Telephone Encounter (Signed)
PT  AWARE  INSURANCE  WILL NOT  COVER  MRI   AT  THIS TIME  PER  DR NISHAN  DO  ECHO , ORDER  ENTERED  AND  MESSAGE SENT  TO [facility]

## 2015-02-20 ENCOUNTER — Ambulatory Visit (HOSPITAL_COMMUNITY): Payer: 59 | Attending: Cardiovascular Disease | Admitting: Radiology

## 2015-02-20 DIAGNOSIS — I5040 Unspecified combined systolic (congestive) and diastolic (congestive) heart failure: Secondary | ICD-10-CM | POA: Diagnosis not present

## 2015-02-20 DIAGNOSIS — I420 Dilated cardiomyopathy: Secondary | ICD-10-CM

## 2015-02-20 NOTE — Progress Notes (Signed)
Echocardiogram performed with Strain on IE33.

## 2015-02-21 ENCOUNTER — Encounter: Payer: Self-pay | Admitting: Cardiovascular Disease

## 2015-02-21 ENCOUNTER — Ambulatory Visit (INDEPENDENT_AMBULATORY_CARE_PROVIDER_SITE_OTHER): Payer: 59 | Admitting: Cardiovascular Disease

## 2015-02-21 VITALS — BP 130/76 | HR 92 | Ht 67.0 in | Wt 177.0 lb

## 2015-02-21 DIAGNOSIS — I514 Myocarditis, unspecified: Secondary | ICD-10-CM

## 2015-02-21 DIAGNOSIS — M329 Systemic lupus erythematosus, unspecified: Secondary | ICD-10-CM

## 2015-02-21 DIAGNOSIS — Z79899 Other long term (current) drug therapy: Secondary | ICD-10-CM

## 2015-02-21 DIAGNOSIS — I420 Dilated cardiomyopathy: Secondary | ICD-10-CM

## 2015-02-21 MED ORDER — CARVEDILOL 12.5 MG PO TABS: 6.2500 mg | ORAL_TABLET | Freq: Two times a day (BID) | ORAL | Status: DC

## 2015-02-21 MED ORDER — LISINOPRIL 20 MG PO TABS: 20.0000 mg | ORAL_TABLET | Freq: Every morning | ORAL | Status: DC

## 2015-02-21 MED ORDER — TORSEMIDE 10 MG PO TABS: 10.0000 mg | ORAL_TABLET | Freq: Two times a day (BID) | ORAL | Status: DC

## 2015-02-21 NOTE — Assessment & Plan Note (Signed)
F/U Rheum.  Can repeat Retuxan if needed but would like CHF and BP to improve first.  Would like to avoid high dose steroids given age, cushingoid and elevated BP with fluid retention on steroids  Continue plaquenil

## 2015-02-21 NOTE — Progress Notes (Signed)
Patient ID: Christine Mcdowell, female   DOB: December 19, 1971, 44 y.o.   MRN: [id number] 44 y.o.  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 This is consistent with lupus myocarditis  2) No early or late gadolinium uptake suggesting no permanent scar  tissue or fibrosis  3) LAE  4) MR  5) Small circumferential pericardial effusion  F/U echo 9/15  EF 50-55%  Study Conclusions  - Left ventricle: The cavity size was normal. Systolic function was normal. The estimated ejection fraction was in the range of 50% to 55%. There is hypokinesis of the anterior myocardium. Doppler parameters are consistent with abnormal left ventricular relaxation (grade 1 diastolic dysfunction). Global lateral strain = -13.6% - Mitral valve: There was mild regurgitation. - Left atrium: The atrium was mildly dilated.  Myovue 9/4 normal with no ischemia  Seeing [name] at [facility] now.  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  Now on retuxan and prednisone lowered to 20 mg over last week   Since trying to do this more swelling in hands , legs  More dyspnea.  Rheum concerned about her heart  Mild atypical pains in chest again with job stress   Echo 02/20/15 with estimated EF 30-35%   She has been compliant with her meds  Last Retuxan was 2/23 and seems to have increased BP  Still with dyspnea despite bid lasix last 4 days  Tried to get MRI approved as initial test but [employer] denied  With echo showing decrease in EF MRI very important to see if there is active inflammation With myocarditis that would warrant high does steroids again    ROS: Denies fever, malais,  weight loss, blurry vision, decreased visual acuity, cough, sputum, SOB, hemoptysis, pleuritic pain, palpitaitons, heartburn, abdominal pain, melena, lower extremity edema, claudication, or rash.  All other systems reviewed and negative  General: Affect appropriate Cushingoid black female  HEENT: normal Neck supple with no adenopathy JVP normal no bruits no thyromegaly Lungs clear with no wheezing and good diaphragmatic motion Heart:  S1/S2 no murmur, no rub, gallop or click PMI normal Abdomen: benighn, BS positve, no tenderness, no AAA no bruit.  No HSM or HJR Distal pulses intact with no bruits No edema Neuro non-focal Skin warm and dry No muscular weakness   Current Outpatient Prescriptions  Medication Sig Dispense Refill  . albuterol (PROVENTIL HFA;VENTOLIN HFA) 108 (90 BASE) MCG/ACT inhaler Inhale 2 puffs into the lungs every 6 (six) hours as needed for wheezing or shortness of breath.    Marland Kitchen albuterol (PROVENTIL) (2.5 MG/3ML) 0.083% nebulizer solution Take 2.5 mg by nebulization every 6 (six) hours as needed for wheezing or shortness of breath.    Marland Kitchen aspirin 81 MG chewable tablet Chew 162 mg by mouth every morning.     . carvedilol (COREG) 6.25 MG tablet Take 1 tablet (6.25 mg total) by mouth 2 (two) times daily with a meal. 60 tablet 11  . folic acid (FOLVITE) 1 MG tablet Take 1 mg by mouth every morning.     . furosemide (LASIX) 40 MG tablet Take 40 mg by mouth every morning.    Marland Kitchen  hydroxychloroquine (PLAQUENIL) 200 MG tablet Take 200 mg by mouth every morning.    . insulin aspart (NOVOLOG FLEXPEN) 100 UNIT/ML FlexPen Inject 2-10 Units into the skin 3 (three) times daily with meals. SLIDING SCALE    . insulin detemir (LEVEMIR) 100 UNIT/ML injection Inject 15-30 Units into the skin 2 (two) times daily. She takes 15 units in the morning and 30 units at bedtime.    Marland Kitchen levocetirizine (XYZAL) 5 MG tablet Take 5 mg by mouth every morning.     Marland Kitchen lisinopril (PRINIVIL,ZESTRIL) 10 MG tablet  Take 10 mg by mouth every morning.    Marland Kitchen lisinopril-hydrochlorothiazide (PRINZIDE,ZESTORETIC) 10-12.5 MG per tablet Take 1 tablet by mouth as needed. TAKE WHEN RETAINING FLUID    . methotrexate (RHEUMATREX) 2.5 MG tablet Take 2.5 mg by mouth every Tuesday.     . methotrexate (RHEUMATREX) 2.5 MG tablet Take 2.5 mg by mouth once a week. Take 8 tablets by mouth once a week  3  . montelukast (SINGULAIR) 10 MG tablet Take 10 mg by mouth at bedtime.     . Multiple Vitamin (MULTIVITAMIN WITH MINERALS) TABS Take 1 tablet by mouth every morning.     . ondansetron (ZOFRAN ODT) 4 MG disintegrating tablet 4mg  ODT q4 hours prn nausea/vomit 10 tablet 0  . potassium chloride SA (K-DUR,KLOR-CON) 20 MEQ tablet 2 TABS  EVERY AM AND  1 TAB  EVERY PM 90 tablet 11  . predniSONE (DELTASONE) 20 MG tablet 3 tabs po daily x 3 days, then 2 tabs x 3 days, then 1.5 tabs x 3 days, then 1 tab x 3 days, then 0.5 tabs x 3 days (Patient taking differently: Take 20 mg by mouth daily. ) 27 tablet 0  . RiTUXimab (RITUXAN IV) Inject into the vein once a week.    . saxagliptin HCl (ONGLYZA) 5 MG TABS tablet Take 5 mg by mouth every morning.     . torsemide (DEMADEX) 10 MG tablet TAKE 1 TABLET (10 MG TOTAL) BY MOUTH DAILY AS NEEDED (AS NEEDED FOR SWELLING). 30 tablet 3  . traZODone (DESYREL) 50 MG tablet Take 50 mg by mouth at bedtime.  5   No current facility-administered medications for this visit.    Allergies  Dilaudid; Erythromycin; and Latex  Electrocardiogram:  SR rate 74 normal   Assessment and Plan

## 2015-02-21 NOTE — Patient Instructions (Addendum)
Your physician recommends that you schedule a follow-up appointment in: 2-3  WEEKS   [place] WITH DR [facility]  Your physician has recommended you make the following change in your medication:  INCREASE  CARVEDILOL TO  12.5 MG  TWICE DAILY INCREASE  LISINOPRIL  TO  20 MG  EVERY DAY START  TORSEMIDE  10 MG  TWICE  DAILY STOP FUROSEMIDE   Your physician recommends that you return for lab work in:  [place]  BMET  BNP  Your physician has requested that you have a cardiac MRI. Cardiac MRI uses a computer to create images of your heart as its beating, producing both still and moving pictures of your heart and major blood vessels. For further information please visit [email or web address] Please follow the instruction sheet given to you today for more information.  LUPUS   MYOCARDITIS

## 2015-02-21 NOTE — Assessment & Plan Note (Signed)
Will increase coreg to 12.5 bid, increase lisinopril to 20 daily.  Lasix has been poorly effective  Change to demedex 10 bid.  Will try to get MRI approved as I have no idea if there is active inflammation in myocardium that would warrant high dose steroids as in 2015  BMET next week f/u flex PA 2 weeks and me next available

## 2015-02-23 ENCOUNTER — Encounter: Payer: Self-pay | Admitting: Cardiovascular Disease

## 2015-02-28 ENCOUNTER — Other Ambulatory Visit (INDEPENDENT_AMBULATORY_CARE_PROVIDER_SITE_OTHER): Payer: 59

## 2015-02-28 DIAGNOSIS — Z79899 Other long term (current) drug therapy: Secondary | ICD-10-CM

## 2015-02-28 LAB — BRAIN NATRIURETIC PEPTIDE: Pro B Natriuretic peptide (BNP): 42 pg/mL (ref 0.0–100.0)

## 2015-02-28 LAB — BASIC METABOLIC PANEL
BUN: 15 mg/dL (ref 6–23)
CO2: 31 mEq/L (ref 19–32)
Calcium: 9.1 mg/dL (ref 8.4–10.5)
Chloride: 104 mEq/L (ref 96–112)
Creatinine, Ser: 0.9 mg/dL (ref 0.40–1.20)
GFR: 86.64 mL/min (ref >60.00)
Glucose, Bld: 94 mg/dL (ref 70–99)
Potassium: 3.8 mEq/L (ref 3.5–5.1)
Sodium: 139 mEq/L (ref 135–145)

## 2015-03-02 ENCOUNTER — Ambulatory Visit (HOSPITAL_COMMUNITY)
Admission: RE | Admit: 2015-03-02 | Discharge: 2015-03-02 | Disposition: A | Discharge status: Home / Self Care | Payer: 59 | Source: Ambulatory Visit | Attending: Cardiovascular Disease | Admitting: Cardiovascular Disease

## 2015-03-02 DIAGNOSIS — Z79899 Other long term (current) drug therapy: Secondary | ICD-10-CM | POA: Insufficient documentation

## 2015-03-02 DIAGNOSIS — I514 Myocarditis, unspecified: Secondary | ICD-10-CM | POA: Insufficient documentation

## 2015-03-02 MED ORDER — GADOBENATE DIMEGLUMINE 529 MG/ML IV SOLN
25.0000 mL | Freq: Once | INTRAVENOUS | Status: AC | PRN
  Administered 2015-03-02: 25 mL via INTRAVENOUS

## 2015-03-03 ENCOUNTER — Emergency Department (HOSPITAL_COMMUNITY): Payer: 59

## 2015-03-03 ENCOUNTER — Emergency Department (HOSPITAL_COMMUNITY)
Admission: EM | Admit: 2015-03-03 | Discharge: 2015-03-03 | Disposition: A | Discharge status: Home / Self Care | Payer: 59 | Attending: Emergency Medicine | Admitting: Emergency Medicine

## 2015-03-03 ENCOUNTER — Encounter (HOSPITAL_COMMUNITY): Payer: Self-pay | Admitting: Emergency Medicine

## 2015-03-03 ENCOUNTER — Telehealth: Payer: Self-pay | Admitting: Cardiovascular Disease

## 2015-03-03 DIAGNOSIS — Z91040 Latex allergy status: Secondary | ICD-10-CM | POA: Diagnosis not present

## 2015-03-03 DIAGNOSIS — Z79899 Other long term (current) drug therapy: Secondary | ICD-10-CM | POA: Diagnosis not present

## 2015-03-03 DIAGNOSIS — J45901 Unspecified asthma with (acute) exacerbation: Secondary | ICD-10-CM | POA: Diagnosis not present

## 2015-03-03 DIAGNOSIS — I10 Essential (primary) hypertension: Secondary | ICD-10-CM | POA: Insufficient documentation

## 2015-03-03 DIAGNOSIS — E119 Type 2 diabetes mellitus without complications: Secondary | ICD-10-CM | POA: Insufficient documentation

## 2015-03-03 DIAGNOSIS — R079 Chest pain, unspecified: Secondary | ICD-10-CM | POA: Diagnosis present

## 2015-03-03 DIAGNOSIS — I509 Heart failure, unspecified: Secondary | ICD-10-CM | POA: Insufficient documentation

## 2015-03-03 DIAGNOSIS — I429 Cardiomyopathy, unspecified: Secondary | ICD-10-CM

## 2015-03-03 DIAGNOSIS — R112 Nausea with vomiting, unspecified: Secondary | ICD-10-CM | POA: Insufficient documentation

## 2015-03-03 DIAGNOSIS — Z794 Long term (current) use of insulin: Secondary | ICD-10-CM | POA: Diagnosis not present

## 2015-03-03 DIAGNOSIS — M1990 Unspecified osteoarthritis, unspecified site: Secondary | ICD-10-CM | POA: Diagnosis not present

## 2015-03-03 DIAGNOSIS — Z7982 Long term (current) use of aspirin: Secondary | ICD-10-CM | POA: Insufficient documentation

## 2015-03-03 LAB — CBC WITH DIFFERENTIAL/PLATELET
Basophils Absolute: 0 10*3/uL (ref 0.0–0.1)
Basophils Relative: 0 % (ref 0–1)
Eosinophils Absolute: 0 10*3/uL (ref 0.0–0.7)
Eosinophils Relative: 1 % (ref 0–5)
HCT: 36.5 % (ref 36.0–46.0)
Hemoglobin: 12 g/dL (ref 12.0–15.0)
Lymphocytes Relative: 23 % (ref 12–46)
Lymphs Abs: 1.1 10*3/uL (ref 0.7–4.0)
MCH: 28 pg (ref 26.0–34.0)
MCHC: 32.9 g/dL (ref 30.0–36.0)
MCV: 85.3 fL (ref 78.0–100.0)
Monocytes Absolute: 0.4 10*3/uL (ref 0.1–1.0)
Monocytes Relative: 9 % (ref 3–12)
Neutro Abs: 3.1 10*3/uL (ref 1.7–7.7)
Neutrophils Relative %: 67 % (ref 43–77)
Platelets: 171 10*3/uL (ref 150–400)
RBC: 4.28 MIL/uL (ref 3.87–5.11)
RDW: 14.4 % (ref 11.5–15.5)
WBC: 4.7 10*3/uL (ref 4.0–10.5)

## 2015-03-03 LAB — I-STAT CHEM 8, ED
BUN: 4 mg/dL — ABNORMAL LOW (ref 6–23)
Calcium, Ion: 1.15 mmol/L (ref 1.12–1.23)
Chloride: 104 mmol/L (ref 96–112)
Creatinine, Ser: 0.7 mg/dL (ref 0.50–1.10)
Glucose, Bld: 90 mg/dL (ref 70–99)
HCT: 39 % (ref 36.0–46.0)
Hemoglobin: 13.3 g/dL (ref 12.0–15.0)
Potassium: 4.1 mmol/L (ref 3.5–5.1)
Sodium: 139 mmol/L (ref 135–145)
TCO2: 20 mmol/L (ref 0–100)

## 2015-03-03 LAB — URINALYSIS, ROUTINE W REFLEX MICROSCOPIC
Bilirubin Urine: NEGATIVE
Glucose, UA: NEGATIVE mg/dL
Hgb urine dipstick: NEGATIVE
Ketones, ur: NEGATIVE mg/dL
Leukocytes, UA: NEGATIVE
Nitrite: NEGATIVE
Protein, ur: NEGATIVE mg/dL
Specific Gravity, Urine: 1.009 (ref 1.005–1.030)
Urobilinogen, UA: 0.2 mg/dL (ref 0.0–1.0)
pH: 8 (ref 5.0–8.0)

## 2015-03-03 LAB — I-STAT TROPONIN, ED: Troponin i, poc: 0 ng/mL (ref 0.00–0.08)

## 2015-03-03 LAB — BRAIN NATRIURETIC PEPTIDE: B Natriuretic Peptide: 51.5 pg/mL (ref 0.0–100.0)

## 2015-03-03 MED ORDER — ONDANSETRON HCL 4 MG PO TABS: 4.0000 mg | ORAL_TABLET | Freq: Four times a day (QID) | ORAL | Status: DC

## 2015-03-03 MED ORDER — MORPHINE SULFATE 4 MG/ML IJ SOLN
4.0000 mg | Freq: Once | INTRAMUSCULAR | Status: AC
  Administered 2015-03-03: 4 mg via INTRAVENOUS
  Filled 2015-03-03: qty 1

## 2015-03-03 MED ORDER — ONDANSETRON HCL 4 MG/2ML IJ SOLN
4.0000 mg | INTRAMUSCULAR | Status: AC
  Administered 2015-03-03: 4 mg via INTRAVENOUS
  Filled 2015-03-03: qty 2

## 2015-03-03 NOTE — ED Provider Notes (Signed)
CSN: [id number]     Arrival date & time 03/03/15  1744 History   First MD Initiated Contact with Patient 03/03/15 2136     Chief Complaint  Patient presents with  . Chest Pain   (Consider location/radiation/quality/duration/timing/severity/associated sxs/prior Treatment) HPI  Christine Mcdowell is a 44 yo female presenting with report of fatigue and chest pain.  She states she received a cardiac MRI yesterday and shortly after began to feel generally fatigued and aching, followed by nausea and vomiting.  Her last episode of vomiting was this am. She spoke to the nurse for her lupus doctor who thought it was heart related. She spoke to the nurse for her cardiac doctor who thought it may be a reaction to the contrast dye. She went to [facility] ED this am and left due to the long wait.  Her most bothersome symptom currently is chest pain which she describes as constant but worse with movement, palpation or deep breath.  She denies fevers, chills, diarrhea or abd pain.    Past Medical History  Diagnosis Date  . Lupus (systemic lupus erythematosus)   . Arthritis   . Hypertension   . Asthma   . Fibromyalgia   . CHF (congestive heart failure)   . 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 Hx of echocardiogram     Echo (9/15):  EF 50-55%, ant HK, Gr 1 DD, mild MR, mild LAE, no effusion  . Hx of cardiovascular stress test     ETT-Myoview (9/15):  No ischemia, EF 52%; NORMAL   Past Surgical History  Procedure Laterality Date  . Knee surgery    . Tubal ligation    . Abdominal hysterectomy  2009   Family History  Problem Relation Age of Onset  . Diabetes Other   . Hypertension Other   . Heart attack Father   . Heart attack Paternal Grandmother    History  Substance Use Topics  . Smoking status: Never Smoker   . Smokeless tobacco: Never Used  . Alcohol Use: No   OB History    No data available     Review of Systems  Constitutional: Negative for fever and chills.    HENT: Negative for sore throat.   Eyes: Negative for visual disturbance.  Respiratory: Positive for shortness of breath. Negative for cough.   Cardiovascular: Positive for chest pain. Negative for leg swelling.  Gastrointestinal: Positive for nausea and vomiting. Negative for diarrhea.  Genitourinary: Negative for dysuria.  Musculoskeletal: Negative for myalgias.  Skin: Negative for rash.  Neurological: Negative for weakness, numbness and headaches.      Allergies  Dilaudid; Erythromycin; and Latex  Home Medications   Prior to Admission medications   Medication Sig Start Date End Date Taking? Authorizing Provider  albuterol (PROVENTIL HFA;VENTOLIN HFA) 108 (90 BASE) MCG/ACT inhaler Inhale 2 puffs into the lungs every 6 (six) hours as needed for wheezing or shortness of breath.   Yes Historical Provider, MD  albuterol (PROVENTIL) (2.5 MG/3ML) 0.083% nebulizer solution Take 2.5 mg by nebulization every 6 (six) hours as needed for wheezing or shortness of breath.   Yes Historical Provider, MD  aspirin 81 MG chewable tablet Chew 162 mg by mouth every morning.    Yes Historical Provider, MD  carvedilol (COREG) 12.5 MG tablet Take 0.5 tablets (6.25 mg total) by mouth 2 (two) times daily with a meal. 02/21/15  Yes Josue Hector, MD  folic acid (FOLVITE) 1 MG tablet Take 1  mg by mouth every morning.    Yes Historical Provider, MD  hydroxychloroquine (PLAQUENIL) 200 MG tablet Take 200 mg by mouth every morning.   Yes Historical Provider, MD  insulin aspart (NOVOLOG FLEXPEN) 100 UNIT/ML FlexPen Inject 2-10 Units into the skin 3 (three) times daily with meals. SLIDING SCALE   Yes Historical Provider, MD  insulin detemir (LEVEMIR) 100 UNIT/ML injection Inject 15-30 Units into the skin 2 (two) times daily. She takes 15 units in the morning and 30 units at bedtime.   Yes Historical Provider, MD  levocetirizine (XYZAL) 5 MG tablet Take 5 mg by mouth every morning.    Yes Historical Provider, MD   lisinopril (PRINIVIL,ZESTRIL) 20 MG tablet Take 1 tablet (20 mg total) by mouth every morning. 02/21/15  Yes Josue Hector, MD  methotrexate (RHEUMATREX) 2.5 MG tablet Take 2.5 mg by mouth once a week. Take 8 tablets by mouth once a week on Wednesday 02/06/15  Yes Historical Provider, MD  montelukast (SINGULAIR) 10 MG tablet Take 10 mg by mouth at bedtime.    Yes Historical Provider, MD  Multiple Vitamin (MULTIVITAMIN WITH MINERALS) TABS Take 1 tablet by mouth every morning.    Yes Historical Provider, MD  potassium chloride SA (K-DUR,KLOR-CON) 20 MEQ tablet 2 TABS  EVERY AM AND  1 TAB  EVERY PM 02/08/15  Yes Josue Hector, MD  predniSONE (DELTASONE) 20 MG tablet 3 tabs po daily x 3 days, then 2 tabs x 3 days, then 1.5 tabs x 3 days, then 1 tab x 3 days, then 0.5 tabs x 3 days Patient taking differently: Take 20 mg by mouth daily.  08/31/14  Yes Sherwood Gambler, MD  saxagliptin HCl (ONGLYZA) 5 MG TABS tablet Take 5 mg by mouth every morning.    Yes Historical Provider, MD  torsemide (DEMADEX) 10 MG tablet Take 1 tablet (10 mg total) by mouth 2 (two) times daily. 02/21/15  Yes Josue Hector, MD  traZODone (DESYREL) 50 MG tablet Take 50 mg by mouth at bedtime. 02/08/15  Yes Historical Provider, MD  ondansetron (ZOFRAN ODT) 4 MG disintegrating tablet 4mg  ODT q4 hours prn nausea/vomit Patient not taking: Reported on 03/03/2015 08/31/14   Sherwood Gambler, MD   BP 132/76 mmHg  Pulse 81  Temp(Src) 97.9 F (36.6 C) (Oral)  Resp 19  Ht 5\' 7"  (1.702 m)  Wt 173 lb 12.8 oz (78.835 kg)  BMI 27.21 kg/m2  SpO2 100% Physical Exam  Constitutional: She appears well-developed and well-nourished. No distress.  HENT:  Head: Normocephalic and atraumatic.  Mouth/Throat: Oropharynx is clear and moist. No oropharyngeal exudate.  Eyes: Conjunctivae are normal.  Neck: Neck supple. No thyromegaly present.  Cardiovascular: Normal rate, regular rhythm and intact distal pulses.  Exam reveals no gallop and no friction rub.    No murmur heard. Pulmonary/Chest: Effort normal and breath sounds normal. No respiratory distress. She has no wheezes. She has no rhonchi. She has no rales. She exhibits tenderness.    Abdominal: Soft. There is no tenderness.  Musculoskeletal: She exhibits no tenderness.  Lymphadenopathy:    She has no cervical adenopathy.  Neurological: She is alert.  Skin: Skin is warm and dry. No rash noted. She is not diaphoretic.  Psychiatric: She has a normal mood and affect.  Nursing note and vitals reviewed.   ED Course  Procedures (including critical care time) Labs Review Labs Reviewed  I-STAT CHEM 8, ED - Abnormal; Notable for the following:    BUN 4 (*)  All other components within normal limits  CBC WITH DIFFERENTIAL/PLATELET  URINALYSIS, ROUTINE W REFLEX MICROSCOPIC  BRAIN NATRIURETIC PEPTIDE  I-STAT TROPOININ, ED    Imaging Review Dg Chest 2 View  03/03/2015   CLINICAL DATA:  Shortness of breath today, asthma, CHF, hypertension, diabetes, lupus  EXAM: CHEST  2 VIEW  COMPARISON:  06/30/2014  FINDINGS: Enlargement of cardiac silhouette.  Mediastinal contours and pulmonary vascularity normal.  Lungs clear.  No pleural effusion or pneumothorax.  Bones unremarkable.  IMPRESSION: Enlargement of cardiac silhouette.  No acute abnormalities.   Electronically Signed   By: Lavonia Dana M.D.   On: 03/03/2015 19:06   Mr Card Morphology Wo/w Cm  03/03/2015   CLINICAL DATA:  Cardiomyopathy Lupus  EXAM: CARDIAC MRI  TECHNIQUE: The patient was scanned on a 1.5 Tesla GE magnet. A dedicated cardiac coil was used. Functional imaging was done using Fiesta sequences. 2,3, and 4 chamber views were done to assess for RWMA's. Modified Simpson's rule using a short axis stack was used to calculate an ejection fraction on a dedicated work Conservation officer, nature. The patient received 25 cc of Multihance. After 10 minutes inversion recovery sequences were used to assess for infiltration and scar tissue.   CONTRAST:  25cc Multihance  FINDINGS: Both atria and RV were normal in size and function. There was a trivial posterior pericardial effusion and prominent epicardial fat. There was no ASD/VSD. The ascending aorta was normal  There was moderate LVE with diffuse hypokinesis. The quantitative EF was 46% (EDV 180 cc ESV 97 cc, SV 83 cc) Delayed enhancement images were normal with no evidence of myocarditis  Or infiltration.  IMPRESSION: 1) Moderate LVE diffuse hypokinesis EF 46% Similar to MRI done 01/28/14  2) No delayed gadolinium uptake or evidence of myocarditis  3) Normal RA/RV/LA  4) Trivial posterior pericardial effusion  Jenkins Rouge   Electronically Signed   By: Jenkins Rouge M.D.   On: 03/03/2015 11:18     EKG Interpretation None      Normal sinus rhythm Possible Left atrial enlargement Anterior infarct , age undetermined  Vent. 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 Pt concerned she if fluid overloaded. Discussed case with Dr. Audie Pinto. CBC, I-stat8, Troponin, BNP, CXR reviewed, no significant abnormalities noted, no indication of fluid overload.  Morphine and zofran given with improvement of symptoms. Discussed findings with pt and she reports feeling reassured.  Pt is well-appearing, in no acute distress and vital signs reviewed and not concerning. She appears safe to be discharged.  Discharge include follow-up with her cardiologist. Discussed to return to the ED if CP becomes exertional, associated with diaphoresis or nausea, radiates to left jaw/arm, worsens or becomes concerning in any way. Pt appears reliable for follow up and is agreeable to discharge.    Filed Vitals:   03/03/15 1814 03/03/15 2051 03/03/15 2109 03/03/15 2115  BP: 125/82 140/82   132/76  Pulse: 90 85  81  Temp: 98.4 F (36.9 C) 97.9 F (36.6 C)    TempSrc: Oral Oral    Resp: 24 27  19   Height: 5\' 7"  (1.702 m)     Weight: 180 lb (81.647 kg)  173 lb 12.8 oz (78.835 kg)  SpO2: 100% 100%  100%   Meds given in ED:  Medications  morphine 4 MG/ML injection 4 mg (4 mg Intravenous Given 03/03/15 2156)  ondansetron (ZOFRAN) injection 4 mg (4 mg Intravenous Given 03/03/15 2154)    Discharge Medication List as of 03/03/2015 10:38 PM    START taking these medications   Details  ondansetron (ZOFRAN) 4 MG tablet Take 1 tablet (4 mg total) by mouth every 6 (six) hours., Starting 03/03/2015, Until Discontinued, Print            Britt Bottom, NP 03/04/15 1617  Leonard Schwartz, MD 03/05/15 [phone]

## 2015-03-03 NOTE — ED Notes (Signed)
Pt called to triage for EKG  After she checked in but pt was in bathroom.

## 2015-03-03 NOTE — Telephone Encounter (Signed)
New Message   Patient is in pain from her cardiac MRI. Patient states she hurts.

## 2015-03-03 NOTE — ED Notes (Signed)
Pt from home for eval of cp and bilateral leg swelling that started last night, pt states recently received treatment at [facility] and received extra fluid, "everytime I receive fluids my CHF flares up." pt reports increase in sob and chest tightness with exertion. Lung sounds clear.

## 2015-03-03 NOTE — Telephone Encounter (Signed)
Patient complaining about severe SOB, swelling in all extremities, upper middle back pain, headache, and joint pain. This started last night around 8:00 pm. Patient had MRI from 2:00 pm to 5:00 pm with IV dye and normal saline. Consulted Dr.Turner, she stated patient need to go see her PCP now. Informed patient that she need to go to her primary doctor or urgent care right now, because she might be having allergic reaction. Patient verbalized understanding.

## 2015-03-03 NOTE — Discharge Instructions (Signed)
PLease follow the directions provided.  Be sure to follow-up with Dr. Johnsie Cancel to ensure you are getting better.  You may use the zofran to help with nausea.  Don't hesitate to return for any new, worsening or concerning symptoms.     SEEK IMMEDIATE MEDICAL CARE IF:  You have severe chest pain, especially if the pain is crushing or pressure-like and spreads to the arms, back, neck, or jaw, or if you have sweating, feeling sick to your stomach (nausea), or shortness of breath. THIS IS AN EMERGENCY. Do not wait to see if the pain will go away. Get medical help at once. Call your local emergency services (911 in [place] DO NOT drive yourself to the hospital.  You develop severe shortness of breath.  You begin to cough up bloody sputum.  You are unable to sleep because you cannot breathe.  You gain weight due to fluid retention.  You develop painful swelling in your calf or leg.  You feel your heart racing and it does not go away or happens when you are resting.

## 2015-03-03 NOTE — ED Notes (Addendum)
Pt to ED with c/o left upper chest pain since yesterday.  Pt was at [facility] earlier today and had EKG but left due to the wait time.  Pt also c/o shortness of breath, nausea, vomiting, lightheaded and dizzy.  Pt had cardiac MRI yesterday but does not know the results.   Pt also c/o pain all over in her joints

## 2015-03-09 ENCOUNTER — Other Ambulatory Visit: Payer: 59

## 2015-03-14 ENCOUNTER — Ambulatory Visit: Payer: 59 | Admitting: Cardiology

## 2015-03-26 NOTE — Progress Notes (Signed)
Erron  

## 2015-03-27 ENCOUNTER — Encounter: Payer: 59 | Admitting: Cardiovascular Disease

## 2015-03-30 ENCOUNTER — Ambulatory Visit: Payer: 59 | Admitting: Cardiovascular Disease

## 2015-04-12 ENCOUNTER — Encounter: Payer: Self-pay | Admitting: Cardiovascular Disease

## 2015-04-13 ENCOUNTER — Other Ambulatory Visit: Payer: Self-pay | Admitting: Cardiovascular Disease

## 2015-05-29 ENCOUNTER — Emergency Department (HOSPITAL_COMMUNITY): Payer: 59

## 2015-05-29 ENCOUNTER — Encounter (HOSPITAL_COMMUNITY): Payer: Self-pay | Admitting: Emergency Medicine

## 2015-05-29 ENCOUNTER — Emergency Department (HOSPITAL_COMMUNITY)
Admission: EM | Admit: 2015-05-29 | Discharge: 2015-05-29 | Disposition: A | Discharge status: Home / Self Care | Payer: 59 | Attending: Emergency Medicine | Admitting: Emergency Medicine

## 2015-05-29 DIAGNOSIS — Z79899 Other long term (current) drug therapy: Secondary | ICD-10-CM | POA: Diagnosis not present

## 2015-05-29 DIAGNOSIS — G43109 Migraine with aura, not intractable, without status migrainosus: Secondary | ICD-10-CM | POA: Insufficient documentation

## 2015-05-29 DIAGNOSIS — Z91040 Latex allergy status: Secondary | ICD-10-CM | POA: Diagnosis not present

## 2015-05-29 DIAGNOSIS — I509 Heart failure, unspecified: Secondary | ICD-10-CM | POA: Insufficient documentation

## 2015-05-29 DIAGNOSIS — J45901 Unspecified asthma with (acute) exacerbation: Secondary | ICD-10-CM | POA: Diagnosis not present

## 2015-05-29 DIAGNOSIS — Z794 Long term (current) use of insulin: Secondary | ICD-10-CM | POA: Diagnosis not present

## 2015-05-29 DIAGNOSIS — Z7982 Long term (current) use of aspirin: Secondary | ICD-10-CM | POA: Diagnosis not present

## 2015-05-29 DIAGNOSIS — E119 Type 2 diabetes mellitus without complications: Secondary | ICD-10-CM | POA: Insufficient documentation

## 2015-05-29 DIAGNOSIS — R51 Headache: Secondary | ICD-10-CM

## 2015-05-29 DIAGNOSIS — R0602 Shortness of breath: Secondary | ICD-10-CM | POA: Diagnosis present

## 2015-05-29 DIAGNOSIS — I10 Essential (primary) hypertension: Secondary | ICD-10-CM | POA: Diagnosis not present

## 2015-05-29 DIAGNOSIS — M1990 Unspecified osteoarthritis, unspecified site: Secondary | ICD-10-CM | POA: Diagnosis not present

## 2015-05-29 DIAGNOSIS — R531 Weakness: Secondary | ICD-10-CM

## 2015-05-29 LAB — URINALYSIS, ROUTINE W REFLEX MICROSCOPIC
Bilirubin Urine: NEGATIVE
Glucose, UA: NEGATIVE mg/dL
Hgb urine dipstick: NEGATIVE
Ketones, ur: NEGATIVE mg/dL
Leukocytes, UA: NEGATIVE
Nitrite: NEGATIVE
Protein, ur: NEGATIVE mg/dL
Specific Gravity, Urine: 1.006 (ref 1.005–1.030)
Urobilinogen, UA: 1 mg/dL (ref 0.0–1.0)
pH: 7 (ref 5.0–8.0)

## 2015-05-29 LAB — CBC
HCT: 35.1 % — ABNORMAL LOW (ref 36.0–46.0)
Hemoglobin: 11.1 g/dL — ABNORMAL LOW (ref 12.0–15.0)
MCH: 26.9 pg (ref 26.0–34.0)
MCHC: 31.6 g/dL (ref 30.0–36.0)
MCV: 85 fL (ref 78.0–100.0)
Platelets: UNDETERMINED 10*3/uL (ref 150–400)
RBC: 4.13 MIL/uL (ref 3.87–5.11)
RDW: 14.3 % (ref 11.5–15.5)
WBC: 5.3 10*3/uL (ref 4.0–10.5)

## 2015-05-29 LAB — RAPID URINE DRUG SCREEN, HOSP PERFORMED
Amphetamines: NOT DETECTED
Barbiturates: NOT DETECTED
Benzodiazepines: NOT DETECTED
Cocaine: NOT DETECTED
Opiates: NOT DETECTED
Tetrahydrocannabinol: NOT DETECTED

## 2015-05-29 LAB — BASIC METABOLIC PANEL
Anion gap: 9 (ref 5–15)
BUN: 9 mg/dL (ref 6–20)
CO2: 29 mmol/L (ref 22–32)
Calcium: 8.7 mg/dL — ABNORMAL LOW (ref 8.9–10.3)
Chloride: 104 mmol/L (ref 101–111)
Creatinine, Ser: 0.64 mg/dL (ref 0.44–1.00)
GFR calc Af Amer: >60 mL/min (ref >60)
GFR calc non Af Amer: >60 mL/min (ref >60)
Glucose, Bld: 89 mg/dL (ref 65–99)
Potassium: 3.2 mmol/L — ABNORMAL LOW (ref 3.5–5.1)
Sodium: 142 mmol/L (ref 135–145)

## 2015-05-29 LAB — PROTIME-INR
INR: 1.13 (ref 0.00–1.49)
Prothrombin Time: 14.7 seconds (ref 11.6–15.2)

## 2015-05-29 LAB — CBG MONITORING, ED: Glucose-Capillary: 95 mg/dL (ref 65–99)

## 2015-05-29 LAB — APTT: aPTT: 22 seconds — ABNORMAL LOW (ref 24–37)

## 2015-05-29 LAB — BRAIN NATRIURETIC PEPTIDE: B Natriuretic Peptide: 92.6 pg/mL (ref 0.0–100.0)

## 2015-05-29 LAB — I-STAT TROPONIN, ED: Troponin i, poc: 0.01 ng/mL (ref 0.00–0.08)

## 2015-05-29 MED ORDER — METOCLOPRAMIDE HCL 5 MG/ML IJ SOLN
10.0000 mg | Freq: Once | INTRAMUSCULAR | Status: AC
  Administered 2015-05-29: 10 mg via INTRAVENOUS
  Filled 2015-05-29: qty 2

## 2015-05-29 MED ORDER — KETOROLAC TROMETHAMINE 30 MG/ML IJ SOLN
30.0000 mg | Freq: Once | INTRAMUSCULAR | Status: AC
  Administered 2015-05-29: 30 mg via INTRAVENOUS
  Filled 2015-05-29: qty 1

## 2015-05-29 MED ORDER — LORAZEPAM 2 MG/ML IJ SOLN
1.0000 mg | Freq: Once | INTRAMUSCULAR | Status: AC
  Administered 2015-05-29: 1 mg via INTRAVENOUS
  Filled 2015-05-29: qty 1

## 2015-05-29 MED ORDER — FENTANYL CITRATE (PF) 100 MCG/2ML IJ SOLN
25.0000 ug | Freq: Once | INTRAMUSCULAR | Status: AC
  Administered 2015-05-29: 25 ug via INTRAVENOUS
  Filled 2015-05-29: qty 2

## 2015-05-29 MED ORDER — DIPHENHYDRAMINE HCL 50 MG/ML IJ SOLN
25.0000 mg | Freq: Once | INTRAMUSCULAR | Status: AC
  Administered 2015-05-29: 25 mg via INTRAVENOUS
  Filled 2015-05-29: qty 1

## 2015-05-29 MED ORDER — POTASSIUM CHLORIDE CRYS ER 20 MEQ PO TBCR
40.0000 meq | EXTENDED_RELEASE_TABLET | Freq: Once | ORAL | Status: AC
  Administered 2015-05-29: 40 meq via ORAL
  Filled 2015-05-29: qty 2

## 2015-05-29 NOTE — ED Provider Notes (Signed)
9:09 AM Patient seen with Christine Mcdowell, Dr. Venora Maples. Pt with h/o lupus, ?TIA in past, on aspirin -- awoke at 3AM with 1 hr of right arm and leg weakness and numbness. Unable to manipulate BP cuff at home, dial a phone, walk. Symptoms then resolved. She had a concurrent HA. She is back at baseline.   ? Complex migraine vs TIA.   Called neuro for reccs. Remo Lipps reccs MRI brain w/o CM, follow-up with flex neuro doc after 9AM.   Pt seen. Her HA is resolved after migraine cocktail.   Exam:  Gen NAD; ENT Heart RRR, nml S1,S2, no m/r/g; Lungs CTAB; Abd soft, NT, no rebound or guarding; Ext 2+ pedal pulses bilaterally, no edema; Neuro nml sensation, motor, coordination, CN II-XII are grossly intact.    Dr. Janann Colonel saw patient and reviewed MRI results. He feels this is more likely HA related but cannot rule out TIA. He would not escalate  Antiplatelets therapy at this time. He is okay with patient being discharged from a neurology standpoint. Will discharge with appropriate follow-up.  BP 159/101 mmHg  Pulse 89  Temp(Src) 98.2 F (36.8 C) (Oral)  Resp 16  Ht 5\' 7"  (1.702 m)  Wt 176 lb (79.833 kg)  BMI 27.56 kg/m2  SpO2 99%   Carlisle Cater, PA-C 05/29/15 [place], MD 06/03/15 2047

## 2015-05-29 NOTE — ED Provider Notes (Signed)
CSN: [id number]     Arrival date & time 05/29/15  0536 History   First MD Initiated Contact with Patient 05/29/15 [phone]     Chief Complaint  Patient presents with  . Hypertension  . Shortness of Breath     (Consider location/radiation/quality/duration/timing/severity/associated sxs/prior Treatment) HPI   Christine Mcdowell is a 44 y.o. Female with SLE, HTN, CHF, asthma, and fibromyalgia who presents to [facility] ED this morning for shortness of breath with associated substernal chest pressure that woke her from her sleep.  She felt her heart pounding, had a pounding headache located behind her eyes, and felt generally weak, worse on the right side, also felt "disorientated", dizzy, with difficulty finding words, family on the phone reported slurred and slowed speech.  The dizziness she describes as a sensation that everything on the right side was "sliding" or moving.  She had difficulty texting with her right hand and with applying her home BP cuff, and after speaking with a family member on the phone with slowed/slurred speech, we was encouraged to come to the ER for evaluation.  Upon my examination, pt is complaining of headache, rated 8/10, she still has generalized weakness, without any numbness or tingling.  She denies any syncope, chest pain, palpitations, visual changes.  Past Medical History  Diagnosis Date  . Lupus (systemic lupus erythematosus)   . Arthritis   . Hypertension   . Asthma   . Fibromyalgia   . CHF (congestive heart failure)   . 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 Hx of echocardiogram     Echo (9/15):  EF 50-55%, ant HK, Gr 1 DD, mild MR, mild LAE, no effusion  . Hx of cardiovascular stress test     ETT-Myoview (9/15):  No ischemia, EF 52%; NORMAL   Past Surgical History  Procedure Laterality Date  . Knee surgery    . Tubal ligation    . Abdominal hysterectomy  2009   Family History  Problem Relation Age of Onset  . Diabetes Other   .  Hypertension Other   . Heart attack Father   . Heart attack Paternal Grandmother    History  Substance Use Topics  . Smoking status: Never Smoker   . Smokeless tobacco: Never Used  . Alcohol Use: No   OB History    No data available     Review of Systems  All other systems reviewed and are negative.     Allergies  Dilaudid; Erythromycin; and Latex  Home Medications   Prior to Admission medications   Medication Sig Start Date End Date Taking? Authorizing Provider  albuterol (PROVENTIL HFA;VENTOLIN HFA) 108 (90 BASE) MCG/ACT inhaler Inhale 2 puffs into the lungs every 6 (six) hours as needed for wheezing or shortness of breath.    Historical Provider, MD  albuterol (PROVENTIL) (2.5 MG/3ML) 0.083% nebulizer solution Take 2.5 mg by nebulization every 6 (six) hours as needed for wheezing or shortness of breath.    Historical Provider, MD  aspirin 81 MG chewable tablet Chew 162 mg by mouth every morning.     Historical Provider, MD  carvedilol (COREG) 12.5 MG tablet Take 0.5 tablets (6.25 mg total) by mouth 2 (two) times daily with a meal. 02/21/15   Josue Hector, MD  folic acid (FOLVITE) 1 MG tablet Take 1 mg by mouth every morning.     Historical Provider, MD  hydroxychloroquine (PLAQUENIL) 200 MG tablet Take 200 mg by mouth every morning.  Historical Provider, MD  insulin aspart (NOVOLOG FLEXPEN) 100 UNIT/ML FlexPen Inject 2-10 Units into the skin 3 (three) times daily with meals. SLIDING SCALE    Historical Provider, MD  insulin detemir (LEVEMIR) 100 UNIT/ML injection Inject 15-30 Units into the skin 2 (two) times daily. She takes 15 units in the morning and 30 units at bedtime.    Historical Provider, MD  levocetirizine (XYZAL) 5 MG tablet Take 5 mg by mouth every morning.     Historical Provider, MD  lisinopril (PRINIVIL,ZESTRIL) 20 MG tablet Take 1 tablet (20 mg total) by mouth every morning. 02/21/15   Josue Hector, MD  methotrexate (RHEUMATREX) 2.5 MG tablet Take 2.5 mg by  mouth once a week. Take 8 tablets by mouth once a week on Wednesday 02/06/15   Historical Provider, MD  montelukast (SINGULAIR) 10 MG tablet Take 10 mg by mouth at bedtime.     Historical Provider, MD  Multiple Vitamin (MULTIVITAMIN WITH MINERALS) TABS Take 1 tablet by mouth every morning.     Historical Provider, MD  ondansetron (ZOFRAN ODT) 4 MG disintegrating tablet 4mg  ODT q4 hours prn nausea/vomit Patient not taking: Reported on 03/03/2015 08/31/14   Sherwood Gambler, MD  ondansetron (ZOFRAN) 4 MG tablet Take 1 tablet (4 mg total) by mouth every 6 (six) hours. 03/03/15   Britt Bottom, NP  potassium chloride SA (K-DUR,KLOR-CON) 20 MEQ tablet 2 TABS  EVERY AM AND  1 TAB  EVERY PM 02/08/15   Josue Hector, MD  predniSONE (DELTASONE) 20 MG tablet 3 tabs po daily x 3 days, then 2 tabs x 3 days, then 1.5 tabs x 3 days, then 1 tab x 3 days, then 0.5 tabs x 3 days Patient taking differently: Take 20 mg by mouth daily.  08/31/14   Sherwood Gambler, MD  saxagliptin HCl (ONGLYZA) 5 MG TABS tablet Take 5 mg by mouth every morning.     Historical Provider, MD  torsemide (DEMADEX) 10 MG tablet Take 1 tablet (10 mg total) by mouth 2 (two) times daily. 02/21/15   Josue Hector, MD  traZODone (DESYREL) 50 MG tablet Take 50 mg by mouth at bedtime. 02/08/15   Historical Provider, MD   BP 162/102 mmHg  Pulse 82  Temp(Src) 99.4 F (37.4 C) (Oral)  Resp 19  Ht 5\' 7"  (1.702 m)  Wt 176 lb (79.833 kg)  BMI 27.56 kg/m2  SpO2 100% Physical Exam  Constitutional: She is oriented to person, place, and time. Vital signs are normal. She appears well-developed and well-nourished. She is sleeping and cooperative. She is easily aroused.  Non-toxic appearance. She does not have a sickly appearance. No distress.  HENT:  Head: Normocephalic and atraumatic.  Eyes: Conjunctivae, EOM and lids are normal. Pupils are equal, round, and reactive to light.  Neck: Normal range of motion. Neck supple.  Cardiovascular: Normal rate,  regular rhythm, normal heart sounds and intact distal pulses.  Exam reveals no gallop, no distant heart sounds and no friction rub.   No murmur heard. Pulmonary/Chest: Effort normal and breath sounds normal. No accessory muscle usage. No respiratory distress. She has no decreased breath sounds. She has no wheezes. She has no rhonchi. She has no rales.  Neurological: She is alert, oriented to person, place, and time and easily aroused. She has normal strength. She is not disoriented. She displays no atrophy and no tremor. No cranial nerve deficit or sensory deficit. She exhibits normal muscle tone. Coordination and gait normal.  Skin: Skin is  warm, dry and intact. She is not diaphoretic. No pallor.    ED Course  Procedures (including critical care time) Labs Review Labs Reviewed  CBC  BASIC METABOLIC PANEL  BRAIN NATRIURETIC PEPTIDE  I-STAT [place], ED  CBG MONITORING, ED    Imaging Review No results found.   EKG Interpretation   Date/Time:  Monday May 29 2015 05:47:46 EDT Ventricular Rate:  80 PR Interval:  210 QRS Duration: 86 QT Interval:  393 QTC Calculation: 453 R Axis:   -28 Text Interpretation:  Sinus rhythm Prolonged PR interval Borderline left  axis deviation No significant change was found Confirmed by CAMPOS  MD,  KEVIN ([id number]) on 05/29/2015 5:53:49 AM      MDM   Final diagnoses:  None    Pt presenting with SOB and chest pressure with multiple generalized complaints including weakness, vertigo, headache, and difficulty speaking.  She has mildly elevated BP, all other vitals reviewed and within normal limits.  She is in no acute distress, respiratory or otherwise.  Exam shows generalized weakness, but no focal neurological deficits.  Plan:  CBC, BMP, BNP, CXR, EKG, treat headache, r/o any cardiac event, fluid overload - although clinically I do not suspect either.  I do not suspect her SOB is from an asthma exacerbation or infection, she is afebrile, denies  wheeze and cough, 100% on RA. Will review completed labs and xray and re-evaluate pt after headache cocktail.  Delsa Grana, PA-C 7:07 AM  Upon further history, by Dr. Venora Maples, pt's main concerns were re: right sided weakness, with inability to text with right hand, or apply her BP cuff.  Stroke/TIA workup now initiated - will ask neuro to consult.  7:34 AM   Neuro consulted with Alecia Lemming PA-C 8:37 AM - see provider note   MRI brain - per neuro request - negative To follow up outpt with neuro for headache with weakness and dx of TIA vs complex migraine  No CVA or emergent issue at this time.  Pt feels better, appears much better than when she first presented to ED.  She has a family member with her and is comfortable going home and following up outpt.  Requested and was given a work note.  Vitals stable, she was not hypertensive, tachy or having any more pain, D/C'd home with family member.        Delsa Grana, PA-C 06/02/15 [place], MD 06/05/15 [phone]

## 2015-05-29 NOTE — ED Notes (Signed)
Patient transported to CT 

## 2015-05-29 NOTE — ED Notes (Signed)
Patient transported to MRI 

## 2015-05-29 NOTE — Discharge Instructions (Signed)
Local Neurology groups - [facility]  Migraine Headache A migraine headache is an intense, throbbing pain on one or both sides of your head. A migraine can last for 30 minutes to several hours. CAUSES  The exact cause of a migraine headache is not always known. However, a migraine may be caused when nerves in the brain become irritated and release chemicals that cause inflammation. This causes pain. Certain things may also trigger migraines, such as:  Alcohol.  Smoking.  Stress.  Menstruation.  Aged cheeses.  Foods or drinks that contain nitrates, glutamate, aspartame, or tyramine.  Lack of sleep.  Chocolate.  Caffeine.  Hunger.  Physical exertion.  Fatigue.  Medicines used to treat chest pain (nitroglycerine), birth control pills, estrogen, and some blood pressure medicines. SIGNS AND SYMPTOMS  Pain on one or both sides of your head.  Pulsating or throbbing pain.  Severe pain that prevents daily activities.  Pain that is aggravated by any physical activity.  Nausea, vomiting, or both.  Dizziness.  Pain with exposure to bright lights, loud noises, or activity.  General sensitivity to bright lights, loud noises, or smells. Before you get a migraine, you may get warning signs that a migraine is coming (aura). An aura may include:  Seeing flashing lights.  Seeing bright spots, halos, or zigzag lines.  Having tunnel vision or blurred vision.  Having feelings of numbness or tingling.  Having trouble talking.  Having muscle weakness. DIAGNOSIS  A migraine headache is often diagnosed based on:  Symptoms.  Physical exam.  A CT scan or MRI of your head. These imaging tests cannot diagnose migraines, but they can help rule out other causes of headaches. TREATMENT Medicines may be given for pain and nausea. Medicines can also be given to help prevent recurrent migraines.  HOME CARE INSTRUCTIONS  Only take over-the-counter or  prescription medicines for pain or discomfort as directed by your health care provider. The use of long-term narcotics is not recommended.  Lie down in a dark, quiet room when you have a migraine.  Keep a journal to find out what may trigger your migraine headaches. For example, write down:  What you eat and drink.  How much sleep you get.  Any change to your diet or medicines.  Limit alcohol consumption.  Quit smoking if you smoke.  Get 7-9 hours of sleep, or as recommended by your health care provider.  Limit stress.  Keep lights dim if bright lights bother you and make your migraines worse. SEEK IMMEDIATE MEDICAL CARE IF:   Your migraine becomes severe.  You have a fever.  You have a stiff neck.  You have vision loss.  You have muscular weakness or loss of muscle control.  You start losing your balance or have trouble walking.  You feel faint or pass out.  You have severe symptoms that are different from your first symptoms. MAKE SURE YOU:   Understand these instructions.  Will watch your condition.  Will get help right away if you are not doing well or get worse. Document Released: 12/09/2005 Document Revised: 04/25/2014 Document Reviewed: 08/16/2013 [facility] Patient Information 2015 [place], [place]. This information is not intended to replace advice given to you by your health care provider. Make sure you discuss any questions you have with your health care provider.

## 2015-05-29 NOTE — ED Notes (Signed)
Pt states she woke up this morning with a bad headache so she checked her blood pressure and it was elevated  Pt states she felt dizzy and light headed  Pt states she also feels short of breath and a little disoriented

## 2015-05-29 NOTE — Consult Note (Signed)
Consult Reason for Consult: headache and weakness Referring Physician: Dr Gwenlyn Fudge ED  CC: headache and right sided weakness  HPI: Christine Mcdowell is an 44 y.o. female with history of SLE, HTN, CHF, TIA, fibromyalgia presenting to [facility] ED with headache, SOB, generalized weakness (worse on the right) and word finding difficulty.  Patient notes waking at 0300 and having difficulty using her right side, this lasted for around 1 hr. In ED she received the migraine cocktail of benadryl, toradol and reglan.   Past Medical History  Diagnosis Date  . Lupus (systemic lupus erythematosus)   . Arthritis   . Hypertension   . Asthma   . Fibromyalgia   . CHF (congestive heart failure)   . 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 Hx of echocardiogram     Echo (9/15):  EF 50-55%, ant HK, Gr 1 DD, mild MR, mild LAE, no effusion  . Hx of cardiovascular stress test     ETT-Myoview (9/15):  No ischemia, EF 52%; NORMAL    Past Surgical History  Procedure Laterality Date  . Knee surgery    . Tubal ligation    . Abdominal hysterectomy  2009    Family History  Problem Relation Age of Onset  . Diabetes Other   . Hypertension Other   . Heart attack Father   . Heart attack Paternal Grandmother     Social History:  reports that she has never smoked. She has never used smokeless tobacco. She reports that she does not drink alcohol or use illicit drugs.  Allergies  Allergen Reactions  . Dilaudid [Hydromorphone Hcl] Nausea And Vomiting and Other (See Comments)    Raises blood pressure   . Erythromycin Nausea And Vomiting  . Iodinated Diagnostic Agents Other (See Comments)    Flu like symptoms  . Latex Hives    Medications: Scheduled:   ROS: Out of a complete 14 system review, the patient complains of only the following symptoms, and all other reviewed systems are negative. +headache  Physical Examination: Filed Vitals:   05/29/15 0836  BP: 148/94  Pulse: 72   Temp:   Resp: 14   Physical Exam  Constitutional: He appears well-developed and well-nourished.  Psych: Affect appropriate to situation Eyes: No scleral injection HENT: No OP obstrucion Head: Normocephalic.  Cardiovascular: Normal rate and regular rhythm.  Respiratory: Effort normal and breath sounds normal.  GI: Soft. Bowel sounds are normal. No distension. There is no tenderness.  Skin: WDI  Neurologic Examination Mental Status: Alert, oriented, thought content appropriate.  Speech fluent without evidence of aphasia.  Able to follow 3 step commands without difficulty. Cranial Nerves: II: funduscopic exam wnl bilaterally, visual fields grossly normal, pupils equal, round, reactive to light and accommodation III,IV, VI: ptosis not present, extra-ocular motions intact bilaterally V,VII: smile symmetric, facial light touch sensation normal bilaterally VIII: hearing normal bilaterally IX,X: gag reflex present XI: trapezius strength/neck flexion strength normal bilaterally XII: tongue strength normal  Motor: Right : Upper extremity    Left:     Upper extremity 5/5 deltoid       5/5 deltoid 5/5 biceps      5/5 biceps  5/5 triceps      5/5 triceps 5/5 hand grip      5/5 hand grip  Lower extremity     Lower extremity 5/5 hip flexor      5/5 hip flexor 5/5 quadricep      5/5 quadriceps  5/5 hamstrings  5/5 hamstrings 5/5 plantar flexion       5/5 plantar flexion 5/5 plantar extension     5/5 plantar extension Tone and bulk:normal tone throughout; no atrophy noted Sensory: Pinprick and light touch intact throughout, bilaterally Deep Tendon Reflexes: 2+ and symmetric throughout Plantars: Right: downgoing   Left: downgoing Cerebellar: normal finger-to-nose, normal rapid alternating movements and normal heel-to-shin test Gait: normal gait and station  Laboratory Studies:   Basic Metabolic Panel:  Recent Labs Lab 05/29/15 0605  NA 142  K 3.2*  CL 104  CO2 29   GLUCOSE 89  BUN 9  CREATININE 0.64  CALCIUM 8.7*    Liver Function Tests: No results for input(s): AST, ALT, ALKPHOS, BILITOT, PROT, ALBUMIN in the last 168 hours. No results for input(s): LIPASE, AMYLASE in the last 168 hours. No results for input(s): AMMONIA in the last 168 hours.  CBC:  Recent Labs Lab 05/29/15 0605  WBC 5.3  HGB 11.1*  HCT 35.1*  MCV 85.0  PLT PLATELET CLUMPS NOTED ON SMEAR, UNABLE TO ESTIMATE    Cardiac Enzymes: No results for input(s): CKTOTAL, CKMB, CKMBINDEX, TROPONINI in the last 168 hours.  BNP: Invalid input(s): POCBNP  CBG:  Recent Labs Lab 05/29/15 [place]    Microbiology: No results found for this or any previous visit.  Coagulation Studies:  Recent Labs  05/29/15 0615  LABPROT 14.7  INR 1.13    Urinalysis:  Recent Labs Lab 05/29/15 0536  COLORURINE YELLOW  LABSPEC 1.006  PHURINE 7.0  GLUCOSEU NEGATIVE  HGBUR NEGATIVE  BILIRUBINUR NEGATIVE  KETONESUR NEGATIVE  PROTEINUR NEGATIVE  UROBILINOGEN 1.0  NITRITE NEGATIVE  LEUKOCYTESUR NEGATIVE    Lipid Panel:  No results found for: CHOL, TRIG, HDL, CHOLHDL, VLDL, LDLCALC  HgbA1C: No results found for: HGBA1C  Urine Drug Screen:     Component Value Date/Time   LABOPIA NONE DETECTED 05/29/2015 0536   COCAINSCRNUR NONE DETECTED 05/29/2015 0536   LABBENZ NONE DETECTED 05/29/2015 0536   AMPHETMU NONE DETECTED 05/29/2015 0536   THCU NONE DETECTED 05/29/2015 0536   LABBARB NONE DETECTED 05/29/2015 0536    Alcohol Level: No results for input(s): ETH in the last 168 hours.   Imaging: Dg Chest 2 View  05/29/2015   CLINICAL DATA:  Nonsmoker with new mid chest pain and shortness of breath today. History of congestive heart failure.  EXAM: CHEST  2 VIEW  COMPARISON:  Radiographs 06/30/2014 and 03/03/2015.  FINDINGS: There is stable mild cardiomegaly. There is vascular congestion without overt pulmonary edema. New small bilateral pleural effusions are associated  with mild left lower lobe atelectasis. There is no consolidation or pneumothorax. Telemetry leads overlie the chest. The bones appear unremarkable.  IMPRESSION: New small bilateral pleural effusions with mild left lower lobe atelectasis. Stable cardiomegaly.   Electronically Signed   By: Richardean Sale M.D.   On: 05/29/2015 07:07   Ct Head Wo Contrast  05/29/2015   CLINICAL DATA:  44 year old female with dizziness, frontal headache, hypertension, right upper extremity weakness. Symptoms since 0300 hours. Initial encounter. Current history of systemic lupus erythematous, dilated cardiomyopathy.  EXAM: CT HEAD WITHOUT CONTRAST  TECHNIQUE: Contiguous axial images were obtained from the base of the skull through the vertex without intravenous contrast.  COMPARISON:  Brain MRI 08/22/2011 and earlier.  FINDINGS: Visualized paranasal sinuses and mastoids are clear. No acute osseous abnormality identified. Visualized orbits and scalp soft tissues are within normal limits.  Cerebral volume is stable and within normal limits. No  ventriculomegaly. No midline shift, mass effect, or evidence of intracranial mass lesion. Gray-white matter differentiation is within normal limits throughout the brain. No suspicious intracranial vascular hyperdensity. No evidence of cortically based acute infarction identified. No acute intracranial hemorrhage identified.  IMPRESSION: Stable and normal noncontrast CT appearance of the brain.   Electronically Signed   By: Genevie Ann M.D.   On: 05/29/2015 08:00     Assessment/Plan:  44y/o hx of SLE, HTN, migraines presenting for evaluation of transient right sided motor and sensory deficits in the setting of a headache. Currently asymptomatic. MRI brain imaging reviewed and unremarkable. Cannot rule out TIA but suspect symptoms related to complex migraine and elevated blood pressure.  -continue ASA 81mg  daily -no further inpatient neurological workup indicated at this time -routine  outpatient neurology follow up   Jim Like, DO [facility] [phone]  If 7pm- 7am, please page neurology on call as listed in [place]. 05/29/2015, 10:25 AM

## 2015-07-10 ENCOUNTER — Ambulatory Visit (INDEPENDENT_AMBULATORY_CARE_PROVIDER_SITE_OTHER): Payer: 59 | Admitting: Cardiovascular Disease

## 2015-07-10 ENCOUNTER — Encounter: Payer: Self-pay | Admitting: Cardiovascular Disease

## 2015-07-10 VITALS — BP 110/60 | HR 75 | Ht 67.0 in | Wt 170.1 lb

## 2015-07-10 DIAGNOSIS — R002 Palpitations: Secondary | ICD-10-CM

## 2015-07-10 NOTE — Patient Instructions (Signed)
Medication Instructions:  NO CHANGES  Labwork: NONE  Testing/Procedures: Your physician has recommended that you wear an event monitor. Event monitors are medical devices that record the heart's electrical activity. Doctors most often [place] these monitors to diagnose arrhythmias. Arrhythmias are problems with the speed or rhythm of the heartbeat. The monitor is a small, portable device. You can wear one while you do your normal daily activities. This is usually used to diagnose what is causing palpitations/syncope (passing out).   Follow-Up: Your physician wants you to follow-up in:   [place] will receive a reminder letter in the mail two months in advance. If you don't receive a letter, please call our office to schedule the follow-up appointment.   Any Other Special Instructions Will Be Listed Below (If Applicable).

## 2015-07-10 NOTE — Progress Notes (Signed)
Patient ID: Christine Mcdowell, female   DOB: 1971-06-15, 44 y.o.   MRN: [id number] 44 y.o.  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 This is consistent with lupus myocarditis  2) No early or late gadolinium uptake suggesting no permanent scar  tissue or fibrosis  3) LAE  4) MR  5) Small circumferential pericardial effusion  F/U echo 9/15  EF 50-55%  Study Conclusions  - Left ventricle: The cavity size was normal. Systolic function was normal. The estimated ejection fraction was in the range of 50% to 55%. There is hypokinesis of the anterior myocardium. Doppler parameters are consistent with abnormal left ventricular relaxation (grade 1 diastolic dysfunction). Global lateral strain = -13.6% - Mitral valve: There was mild regurgitation. - Left atrium: The atrium was mildly dilated.  Myovue 9/4 normal with no ischemia  Seeing Christine Mcdowell at [facility] now.  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  Now on retuxan and prednisone lowered to 20 mg over last week   Since trying to do this more swelling in hands , legs  More dyspnea.  Rheum concerned about her heart  Mild atypical pains in chest again with job stress  Last visit meds increased and given demedex for steroid induced swelling  F/U MRI done and reviewed 03/02/15 IMPRESSION: 1) Moderate LVE diffuse hypokinesis EF 46% Similar to MRI done 01/28/14  2) No delayed gadolinium uptake or evidence of myocarditis  3) Normal RA/RV/LA  4) Trivial posterior pericardial effusion  Back on steroids So far DM not recurred  Getting Retuxin shots.  Having palpitations at night with rapid  heart rate    ROS: Denies fever, malais, weight loss, blurry vision, decreased visual acuity, cough, sputum, SOB, hemoptysis, pleuritic pain, palpitaitons, heartburn, abdominal pain, melena, lower extremity edema, claudication, or rash.  All other systems reviewed and negative  General: Affect appropriate Cushingoid black female  HEENT: normal Neck supple with no adenopathy JVP normal no bruits no thyromegaly Lungs clear with no wheezing and good diaphragmatic motion Heart:  S1/S2 no murmur, no rub, gallop or click PMI normal Abdomen: benighn, BS positve, no tenderness, no AAA no bruit.  No HSM or HJR Distal pulses intact with no bruits No edema Neuro non-focal Skin warm and dry No muscular weakness   Current Outpatient Prescriptions  Medication Sig Dispense Refill  . albuterol (PROVENTIL HFA;VENTOLIN HFA) 108 (90 BASE) MCG/ACT inhaler Inhale 2 puffs into the lungs every 6 (six) hours as needed for wheezing or shortness of breath.    Marland Kitchen albuterol (PROVENTIL) (2.5 MG/3ML) 0.083% nebulizer solution Take 2.5 mg by nebulization every 6 (six) hours as needed for wheezing or shortness of breath.    Marland Kitchen aspirin 81 MG chewable tablet Chew 162 mg by mouth every morning.     . carvedilol (COREG) 12.5 MG tablet Take 0.5 tablets (6.25 mg total) by mouth 2 (two) times daily with a meal. 60 tablet 11  . folic acid (FOLVITE) 1 MG tablet Take 1 mg by mouth every morning.     . furosemide (LASIX) 40 MG tablet Take 40 mg by mouth daily.    Marland Kitchen  HYDROcodone-acetaminophen (NORCO/VICODIN) 5-325 MG per tablet Take 1 tablet by mouth every 8 (eight) hours as needed. pain    . hydroxychloroquine (PLAQUENIL) 200 MG tablet Take 200 mg by mouth every morning.    . insulin aspart (NOVOLOG FLEXPEN) 100 UNIT/ML FlexPen Inject 2-10 Units into the skin 3 (three) times daily with meals. SLIDING SCALE    . insulin detemir (LEVEMIR) 100 UNIT/ML injection Inject 15-30 Units into the skin 2 (two) times daily. She takes 15  units in the morning and 30 units at bedtime.    Marland Kitchen levocetirizine (XYZAL) 5 MG tablet Take 5 mg by mouth every morning.     Marland Kitchen lisinopril (PRINIVIL,ZESTRIL) 10 MG tablet Take 10 mg by mouth daily.    Marland Kitchen lisinopril (PRINIVIL,ZESTRIL) 20 MG tablet Take 1 tablet (20 mg total) by mouth every morning. (Patient not taking: Reported on 05/29/2015) 30 tablet 11  . meloxicam (MOBIC) 15 MG tablet Take 15 mg by mouth daily as needed. Inflammation or pain    . methotrexate (RHEUMATREX) 2.5 MG tablet Take 20 mg by mouth once a week. Take 8 tablets by mouth once a week on Wednesday  3  . montelukast (SINGULAIR) 10 MG tablet Take 10 mg by mouth at bedtime.     . Multiple Vitamin (MULTIVITAMIN WITH MINERALS) TABS Take 1 tablet by mouth every morning.     . ondansetron (ZOFRAN ODT) 4 MG disintegrating tablet 4mg  ODT q4 hours prn nausea/vomit 10 tablet 0  . ondansetron (ZOFRAN) 4 MG tablet Take 1 tablet (4 mg total) by mouth every 6 (six) hours. (Patient not taking: Reported on 05/29/2015) 12 tablet 0  . potassium chloride SA (K-DUR,KLOR-CON) 20 MEQ tablet 2 TABS  EVERY AM AND  1 TAB  EVERY PM 90 tablet 11  . predniSONE (DELTASONE) 20 MG tablet 3 tabs po daily x 3 days, then 2 tabs x 3 days, then 1.5 tabs x 3 days, then 1 tab x 3 days, then 0.5 tabs x 3 days (Patient taking differently: Take 20 mg by mouth daily. ) 27 tablet 0  . saxagliptin HCl (ONGLYZA) 5 MG TABS tablet Take 5 mg by mouth every morning.     . torsemide (DEMADEX) 10 MG tablet Take 1 tablet (10 mg total) by mouth 2 (two) times daily. (Patient taking differently: Take 10 mg by mouth 2 (two) times daily as needed (fluid). ) 60 tablet 11  . traZODone (DESYREL) 50 MG tablet Take 50 mg by mouth at bedtime.  5   No current facility-administered medications for this visit.    Allergies  Dilaudid; Erythromycin; Iodinated diagnostic agents; and Latex  Electrocardiogram:  SR rate 74 normal   Assessment and Plan Lupus:  F/U Christine Mcdowell on retuxin and  steroids.   Edema:  From steroids  Continue lasix and K Myocarditis:  Resolved recent MRI with no gadolinium uptake  DCM:  EF improved  46% continue current Rx Palpitations:  Event monitor since episodes mostly at night   F/U with me 3 months Event monitor ordered

## 2015-07-12 ENCOUNTER — Ambulatory Visit (INDEPENDENT_AMBULATORY_CARE_PROVIDER_SITE_OTHER): Payer: 59

## 2015-07-12 DIAGNOSIS — R002 Palpitations: Secondary | ICD-10-CM

## 2015-07-17 DIAGNOSIS — Z7952 Long term (current) use of systemic steroids: Secondary | ICD-10-CM | POA: Insufficient documentation

## 2015-09-05 ENCOUNTER — Other Ambulatory Visit: Payer: Self-pay | Admitting: Physician Assistant

## 2015-10-09 NOTE — Progress Notes (Signed)
Patient ID: Christine Mcdowell, female   DOB: 1971/07/04, 44 y.o.   MRN: [id number] 44 y.o.  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 This is consistent with lupus myocarditis  2) No early or late gadolinium uptake suggesting no permanent scar  tissue or fibrosis  3) LAE  4) MR  5) Small circumferential pericardial effusion  F/U echo 9/15  EF 50-55%  Study Conclusions  - Left ventricle: The cavity size was normal. Systolic function was normal. The estimated ejection fraction was in the range of 50% to 55%. There is hypokinesis of the anterior myocardium. Doppler parameters are consistent with abnormal left ventricular relaxation (grade 1 diastolic dysfunction). Global lateral strain = -13.6% - Mitral valve: There was mild regurgitation. - Left atrium: The atrium was mildly dilated.  Myovue 9/4 normal with no ischemia  Seeing [name] at [facility] now.  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  Now on retuxan and prednisone lowered to 20 mg over last week   Since trying to do this more swelling in hands , legs  More dyspnea.  Rheum concerned about her heart  Mild atypical pains in chest again with job stress  Last visit meds increased and given demedex for steroid induced swelling  F/U MRI done and reviewed 03/02/15 IMPRESSION: 1) Moderate LVE diffuse hypokinesis EF 46% Similar to MRI done 01/28/14  2) No delayed gadolinium uptake or evidence of myocarditis  3) Normal RA/RV/LA  4) Trivial posterior pericardial effusion  Back on steroids So far DM not recurred  Getting Retuxin shots.  Having palpitations at night with rapid  heart rate   F/U event monitor 07/2015 with NSR and no arrhythmias   ROS: Denies fever, malais, weight loss, blurry vision, decreased visual acuity, cough, sputum, SOB, hemoptysis, pleuritic pain, palpitaitons, heartburn, abdominal pain, melena, lower extremity edema, claudication, or rash.  All other systems reviewed and negative  General: Affect appropriate Cushingoid black female  HEENT: normal Neck supple with no adenopathy JVP normal no bruits no thyromegaly Lungs clear with no wheezing and good diaphragmatic motion Heart:  S1/S2 no murmur, no rub, gallop or click PMI normal Abdomen: benighn, BS positve, no tenderness, no AAA no bruit.  No HSM or HJR Distal pulses intact with no bruits No edema Neuro non-focal Skin warm and dry No muscular weakness   Current Outpatient Prescriptions  Medication Sig Dispense Refill  . albuterol (PROVENTIL HFA;VENTOLIN HFA) 108 (90 BASE) MCG/ACT inhaler Inhale 2 puffs into the lungs every 6 (six) hours as needed for wheezing or shortness of breath.    Marland Kitchen albuterol (PROVENTIL) (2.5 MG/3ML) 0.083% nebulizer solution Take 2.5 mg by nebulization every 6 (six) hours as needed for wheezing or shortness of breath.    Marland Kitchen aspirin 81 MG chewable tablet Chew 162 mg by mouth every morning.     . carvedilol (COREG) 12.5 MG tablet Take 0.5 tablets (6.25 mg total) by mouth 2 (two) times daily with a meal. 60 tablet 11  . folic acid (FOLVITE) 1 MG tablet Take 1 mg by mouth every morning.     . furosemide (LASIX) 40 MG tablet  TAKE 1 TABLET BY MOUTH DAILY 30 tablet 2  . HYDROcodone-acetaminophen (NORCO/VICODIN) 5-325 MG per tablet Take 1 tablet by mouth every 8 (eight) hours as needed. pain    . hydroxychloroquine (PLAQUENIL) 200 MG tablet Take 200 mg by mouth every morning.    . insulin aspart (NOVOLOG FLEXPEN) 100 UNIT/ML FlexPen Inject 2-10 Units into the skin 3 (three) times daily with meals. SLIDING SCALE    . insulin detemir (LEVEMIR) 100 UNIT/ML injection  Inject 15-30 Units into the skin 2 (two) times daily. She takes 15 units in the morning and 30 units at bedtime.    Marland Kitchen levocetirizine (XYZAL) 5 MG tablet Take 5 mg by mouth every morning.     Marland Kitchen lisinopril (PRINIVIL,ZESTRIL) 20 MG tablet Take 20 mg by mouth daily.    . meloxicam (MOBIC) 15 MG tablet Take 15 mg by mouth daily as needed. Inflammation or pain    . methotrexate (RHEUMATREX) 2.5 MG tablet Take 20 mg by mouth once a week. Take 8 tablets by mouth once a week on Wednesday  3  . Multiple Vitamin (MULTIVITAMIN WITH MINERALS) TABS Take 1 tablet by mouth every morning.     . ondansetron (ZOFRAN) 4 MG tablet Take 4 mg by mouth every 8 (eight) hours as needed for nausea or vomiting.    . potassium chloride SA (K-DUR,KLOR-CON) 20 MEQ tablet 2 TABS  EVERY AM AND  1 TAB  EVERY PM 90 tablet 11  . predniSONE (DELTASONE) 20 MG tablet Take 20 mg by mouth daily with breakfast.    . saxagliptin HCl (ONGLYZA) 5 MG TABS tablet Take 5 mg by mouth every morning.     . torsemide (DEMADEX) 10 MG tablet Take 10 mg by mouth daily.    . traZODone (DESYREL) 50 MG tablet Take 50 mg by mouth at bedtime.  5   No current facility-administered medications for this visit.    Allergies  Dilaudid; Erythromycin; Iodinated diagnostic agents; and Latex  Electrocardiogram:  SR rate 74 normal   Assessment and Plan Lupus:  F/U Dr Reece Leader on retuxin and steroids.   Edema:  From steroids  Continue lasix and K Myocarditis:  Resolved recent MRI with no gadolinium uptake  DCM:  EF improved  46% continue current Rx Palpitations:  Event monitor with no arrhythmias August 2016 continue beta blocker   F/U with me 6 months

## 2015-10-11 ENCOUNTER — Encounter: Payer: 59 | Admitting: Cardiovascular Disease

## 2015-10-12 ENCOUNTER — Encounter: Payer: Self-pay | Admitting: Cardiovascular Disease

## 2015-11-18 ENCOUNTER — Emergency Department (HOSPITAL_COMMUNITY)
Admission: EM | Admit: 2015-11-18 | Discharge: 2015-11-18 | Disposition: A | Discharge status: Home / Self Care | Payer: BLUE CROSS/BLUE SHIELD | Attending: Emergency Medicine | Admitting: Emergency Medicine

## 2015-11-18 ENCOUNTER — Emergency Department (HOSPITAL_COMMUNITY): Payer: BLUE CROSS/BLUE SHIELD

## 2015-11-18 ENCOUNTER — Encounter (HOSPITAL_COMMUNITY): Payer: Self-pay

## 2015-11-18 DIAGNOSIS — G4489 Other headache syndrome: Secondary | ICD-10-CM | POA: Insufficient documentation

## 2015-11-18 DIAGNOSIS — Z792 Long term (current) use of antibiotics: Secondary | ICD-10-CM | POA: Insufficient documentation

## 2015-11-18 DIAGNOSIS — J45901 Unspecified asthma with (acute) exacerbation: Secondary | ICD-10-CM | POA: Insufficient documentation

## 2015-11-18 DIAGNOSIS — R05 Cough: Secondary | ICD-10-CM | POA: Insufficient documentation

## 2015-11-18 DIAGNOSIS — M1990 Unspecified osteoarthritis, unspecified site: Secondary | ICD-10-CM | POA: Insufficient documentation

## 2015-11-18 DIAGNOSIS — Z794 Long term (current) use of insulin: Secondary | ICD-10-CM | POA: Insufficient documentation

## 2015-11-18 DIAGNOSIS — E119 Type 2 diabetes mellitus without complications: Secondary | ICD-10-CM | POA: Diagnosis not present

## 2015-11-18 DIAGNOSIS — R0601 Orthopnea: Secondary | ICD-10-CM | POA: Insufficient documentation

## 2015-11-18 DIAGNOSIS — I509 Heart failure, unspecified: Secondary | ICD-10-CM | POA: Insufficient documentation

## 2015-11-18 DIAGNOSIS — M797 Fibromyalgia: Secondary | ICD-10-CM | POA: Diagnosis not present

## 2015-11-18 DIAGNOSIS — Z91040 Latex allergy status: Secondary | ICD-10-CM | POA: Insufficient documentation

## 2015-11-18 DIAGNOSIS — I10 Essential (primary) hypertension: Secondary | ICD-10-CM | POA: Diagnosis not present

## 2015-11-18 DIAGNOSIS — Z79899 Other long term (current) drug therapy: Secondary | ICD-10-CM | POA: Insufficient documentation

## 2015-11-18 DIAGNOSIS — R059 Cough, unspecified: Secondary | ICD-10-CM

## 2015-11-18 LAB — CBC WITH DIFFERENTIAL/PLATELET
Basophils Absolute: 0 10*3/uL (ref 0.0–0.1)
Basophils Relative: 0 %
Eosinophils Absolute: 0.1 10*3/uL (ref 0.0–0.7)
Eosinophils Relative: 1 %
HCT: 38.7 % (ref 36.0–46.0)
Hemoglobin: 12.5 g/dL (ref 12.0–15.0)
Lymphocytes Relative: 16 %
Lymphs Abs: 0.9 10*3/uL (ref 0.7–4.0)
MCH: 28.7 pg (ref 26.0–34.0)
MCHC: 32.3 g/dL (ref 30.0–36.0)
MCV: 88.8 fL (ref 78.0–100.0)
Monocytes Absolute: 0.6 10*3/uL (ref 0.1–1.0)
Monocytes Relative: 10 %
Neutro Abs: 4.3 10*3/uL (ref 1.7–7.7)
Neutrophils Relative %: 73 %
Platelets: 135 10*3/uL — ABNORMAL LOW (ref 150–400)
RBC: 4.36 MIL/uL (ref 3.87–5.11)
RDW: 14 % (ref 11.5–15.5)
WBC: 5.8 10*3/uL (ref 4.0–10.5)

## 2015-11-18 LAB — COMPREHENSIVE METABOLIC PANEL
ALT: 14 U/L (ref 14–54)
AST: 27 U/L (ref 15–41)
Albumin: 4.3 g/dL (ref 3.5–5.0)
Alkaline Phosphatase: 43 U/L (ref 38–126)
Anion gap: 7 (ref 5–15)
BUN: 10 mg/dL (ref 6–20)
CO2: 26 mmol/L (ref 22–32)
Calcium: 8.8 mg/dL — ABNORMAL LOW (ref 8.9–10.3)
Chloride: 104 mmol/L (ref 101–111)
Creatinine, Ser: 0.76 mg/dL (ref 0.44–1.00)
GFR calc Af Amer: >60 mL/min (ref >60)
GFR calc non Af Amer: >60 mL/min (ref >60)
Glucose, Bld: 81 mg/dL (ref 65–99)
Potassium: 4.5 mmol/L (ref 3.5–5.1)
Sodium: 137 mmol/L (ref 135–145)
Total Bilirubin: 1 mg/dL (ref 0.3–1.2)
Total Protein: 6.7 g/dL (ref 6.5–8.1)

## 2015-11-18 LAB — BRAIN NATRIURETIC PEPTIDE: B Natriuretic Peptide: 73.6 pg/mL (ref 0.0–100.0)

## 2015-11-18 MED ORDER — KETOROLAC TROMETHAMINE 60 MG/2ML IM SOLN
60.0000 mg | Freq: Once | INTRAMUSCULAR | Status: AC
  Administered 2015-11-18: 60 mg via INTRAMUSCULAR
  Filled 2015-11-18: qty 2

## 2015-11-18 NOTE — ED Notes (Addendum)
Pt presents with c/o cough that has been present for approx 2.5 weeks, a migraine for approx 2 days, and also reports that her BP has been elevated for a couple of days. Pt reports some vomiting as well with her migraine. Pt also c/o fluid retention. Reports that she has a hx of CHF and has been retaining fluid recently and had some increased weight gain.

## 2015-11-18 NOTE — Discharge Instructions (Signed)
Cough, Adult A cough helps to clear your throat and lungs. A cough may last only 2-3 weeks (acute), or it may last longer than 8 weeks (chronic). Many different things can cause a cough. A cough may be a sign of an illness or another medical condition. HOME CARE  Pay attention to any changes in your cough.  Take medicines only as told by your doctor.  If you were prescribed an antibiotic medicine, take it as told by your doctor. Do not stop taking it even if you start to feel better.  Talk with your doctor before you try using a cough medicine.  Drink enough fluid to keep your pee (urine) clear or pale yellow.  If the air is dry, use a cold steam vaporizer or humidifier in your home.  Stay away from things that make you cough at work or at home.  If your cough is worse at night, try using extra pillows to raise your head up higher while you sleep.  Do not smoke, and try not to be around smoke. If you need help quitting, ask your doctor.  Do not have caffeine.  Do not drink alcohol.  Rest as needed. GET HELP IF:  You have new problems (symptoms).  You cough up yellow fluid (pus).  Your cough does not get better after 2-3 weeks, or your cough gets worse.  Medicine does not help your cough and you are not sleeping well.  You have pain that gets worse or pain that is not helped with medicine.  You have a fever.  You are losing weight and you do not know why.  You have night sweats. GET HELP RIGHT AWAY IF:  You cough up blood.  You have trouble breathing.  Your heartbeat is very fast.   This information is not intended to replace advice given to you by your health care provider. Make sure you discuss any questions you have with your health care provider.   Document Released: 08/22/2011 Document Revised: 08/30/2015 Document Reviewed: 02/15/2015 Elsevier Interactive Patient Education 2016 [place] Headache Without Cause A headache is pain or  discomfort felt around the head or neck area. The specific cause of a headache may not be found. There are many causes and types of headaches. A few common ones are:  Tension headaches.  Migraine headaches.  Cluster headaches.  Chronic daily headaches. HOME CARE INSTRUCTIONS  Watch your condition for any changes. Take these steps to help with your condition: Managing Pain  Take over-the-counter and prescription medicines only as told by your health care provider.  Lie down in a dark, quiet room when you have a headache.  If directed, apply ice to the head and neck area:  Put ice in a plastic bag.  Place a towel between your skin and the bag.  Leave the ice on for 20 minutes, 2-3 times per day.  Use a heating pad or hot shower to apply heat to the head and neck area as told by your health care provider.  Keep lights dim if bright lights bother you or make your headaches worse. Eating and Drinking  Eat meals on a regular schedule.  Limit alcohol use.  Decrease the amount of caffeine you drink, or stop drinking caffeine. General Instructions  Keep all follow-up visits as told by your health care provider. This is important.  Keep a headache journal to help find out what may trigger your headaches. For example, write down:  What you eat and  drink.  How much sleep you get.  Any change to your diet or medicines.  Try massage or other relaxation techniques.  Limit stress.  Sit up straight, and do not tense your muscles.  Do not use tobacco products, including cigarettes, chewing tobacco, or e-cigarettes. If you need help quitting, ask your health care provider.  Exercise regularly as told by your health care provider.  Sleep on a regular schedule. Get 7-9 hours of sleep, or the amount recommended by your health care provider. SEEK MEDICAL CARE IF:   Your symptoms are not helped by medicine.  You have a headache that is different from the usual headache.  You  have nausea or you vomit.  You have a fever. SEEK IMMEDIATE MEDICAL CARE IF:   Your headache becomes severe.  You have repeated vomiting.  You have a stiff neck.  You have a loss of vision.  You have problems with speech.  You have pain in the eye or ear.  You have muscular weakness or loss of muscle control.  You lose your balance or have trouble walking.  You feel faint or pass out.  You have confusion.   This information is not intended to replace advice given to you by your health care provider. Make sure you discuss any questions you have with your health care provider.   Document Released: 12/09/2005 Document Revised: 08/30/2015 Document Reviewed: 04/03/2015 Elsevier Interactive Patient Education [employer].

## 2015-11-18 NOTE — ED Provider Notes (Signed)
CSN: [id number]     Arrival date & time 11/18/15  1304 History   First MD Initiated Contact with Patient 11/18/15 1319     Chief Complaint  Patient presents with  . Cough  . Migraine     (Consider location/radiation/quality/duration/timing/severity/associated sxs/prior Treatment) HPI   Christine Mcdowell is a 44 y.o. female who presents for evaluation of shortness of breath, orthopnea, cough, without sputum production, and headache. Symptoms progressive over 2 weeks time. She is using her usual medications, without relief. She does not smoke cigarettes. She denies fever, chills, vomiting, dysuria, or change in bowel habits. She states her weight is up 10 pounds from her baseline. There are no other known modifying factors.   Past Medical History  Diagnosis Date  . Lupus (systemic lupus erythematosus) ([place])   . Arthritis   . Hypertension   . Asthma   . Fibromyalgia   . CHF (congestive heart failure) ([place])   . Diabetes mellitus without complication ([place])     steroid induced  . Hx of echocardiogram     Echo (9/15):  EF 50-55%, ant HK, Gr 1 DD, mild MR, mild LAE, no effusion  . Hx of cardiovascular stress test     ETT-Myoview (9/15):  No ischemia, EF 52%; NORMAL   Past Surgical History  Procedure Laterality Date  . Knee surgery    . Tubal ligation    . Abdominal hysterectomy  2009   Family History  Problem Relation Age of Onset  . Diabetes Other   . Hypertension Other   . Heart attack Father   . Heart attack Paternal Grandmother    Social History  Substance Use Topics  . Smoking status: Never Smoker   . Smokeless tobacco: Never Used  . Alcohol Use: No   OB History    No data available     Review of Systems  All other systems reviewed and are negative.     Allergies  Dilaudid; Erythromycin; Hydromorphone; Iodinated diagnostic agents; and Latex  Home Medications   Prior to Admission medications   Medication Sig Start Date End Date Taking? Authorizing  Provider  ACTEMRA 162 MG/0.9ML SOSY Inject 162 mg into the skin every 14 (fourteen) days. 11/09/15  Yes Historical Provider, MD  albuterol (PROVENTIL HFA;VENTOLIN HFA) 108 (90 BASE) MCG/ACT inhaler Inhale 2 puffs into the lungs every 6 (six) hours as needed for wheezing or shortness of breath.   Yes Historical Provider, MD  albuterol (PROVENTIL) (2.5 MG/3ML) 0.083% nebulizer solution Take 2.5 mg by nebulization every 6 (six) hours as needed for wheezing or shortness of breath.   Yes Historical Provider, MD  aspirin 81 MG chewable tablet Chew 162 mg by mouth every morning.    Yes Historical Provider, MD  carvedilol (COREG) 12.5 MG tablet Take 0.5 tablets (6.25 mg total) by mouth 2 (two) times daily with a meal. Patient taking differently: Take 6.25 mg by mouth daily.  02/21/15  Yes Josue Hector, MD  folic acid (FOLVITE) 1 MG tablet Take 1 mg by mouth every morning.    Yes Historical Provider, MD  furosemide (LASIX) 40 MG tablet TAKE 1 TABLET BY MOUTH DAILY 09/06/15  Yes Josue Hector, MD  HYDROcodone-acetaminophen (NORCO/VICODIN) 5-325 MG per tablet Take 1 tablet by mouth every 8 (eight) hours as needed. pain 05/08/15  Yes Historical Provider, MD  hydroxychloroquine (PLAQUENIL) 200 MG tablet Take 200 mg by mouth every morning.   Yes Historical Provider, MD  insulin aspart (NOVOLOG FLEXPEN) 100 UNIT/ML FlexPen  Inject 2-10 Units into the skin 3 (three) times daily with meals. SLIDING SCALE   Yes Historical Provider, MD  insulin detemir (LEVEMIR) 100 UNIT/ML injection Inject 15-30 Units into the skin 2 (two) times daily. She takes 15 units in the morning and 30 units at bedtime.   Yes Historical Provider, MD  lisinopril (PRINIVIL,ZESTRIL) 20 MG tablet Take 20 mg by mouth daily.   Yes Historical Provider, MD  meloxicam (MOBIC) 15 MG tablet Take 15 mg by mouth daily as needed. Inflammation or pain 05/08/15  Yes Historical Provider, MD  methotrexate (RHEUMATREX) 2.5 MG tablet Take 20 mg by mouth once a week.  Take 8 tablets by mouth once a week on Wednesday 02/06/15  Yes Historical Provider, MD  Multiple Vitamin (MULTIVITAMIN WITH MINERALS) TABS Take 1 tablet by mouth every morning.    Yes Historical Provider, MD  ondansetron (ZOFRAN) 4 MG tablet Take 4 mg by mouth every 8 (eight) hours as needed for nausea or vomiting.   Yes Historical Provider, MD  potassium chloride SA (K-DUR,KLOR-CON) 20 MEQ tablet 2 TABS  EVERY AM AND  1 TAB  EVERY PM Patient taking differently: Take 20-40 mEq by mouth 2 (two) times daily. 2 TABS  EVERY AM AND  1 TAB  EVERY PM 02/08/15  Yes Josue Hector, MD  predniSONE (DELTASONE) 20 MG tablet Take 20 mg by mouth daily with breakfast.   Yes Historical Provider, MD  saxagliptin HCl (ONGLYZA) 5 MG TABS tablet Take 5 mg by mouth every morning.    Yes Historical Provider, MD  torsemide (DEMADEX) 10 MG tablet Take 10 mg by mouth daily as needed (fluid).    Yes Historical Provider, MD  traZODone (DESYREL) 50 MG tablet Take 50 mg by mouth at bedtime as needed for sleep.  02/08/15  Yes Historical Provider, MD  levocetirizine (XYZAL) 5 MG tablet Take 5 mg by mouth every morning.     Historical Provider, MD   BP 145/99 mmHg  Pulse 95  Temp(Src) 98.4 F (36.9 C) (Oral)  Resp 18  SpO2 100% Physical Exam  Constitutional: She is oriented to person, place, and time. She appears well-developed and well-nourished.  HENT:  Head: Normocephalic and atraumatic.  Right Ear: External ear normal.  Left Ear: External ear normal.  Eyes: Conjunctivae and EOM are normal. Pupils are equal, round, and reactive to light.  Neck: Normal range of motion and phonation normal. Neck supple.  Cardiovascular: Normal rate, regular rhythm and normal heart sounds.   Pulmonary/Chest: Effort normal and breath sounds normal. No respiratory distress. She has no rales. She exhibits no tenderness and no bony tenderness.  Abdominal: Soft. There is no tenderness.  Musculoskeletal: Normal range of motion.  Neurological:  She is alert and oriented to person, place, and time. No cranial nerve deficit or sensory deficit. She exhibits normal muscle tone. Coordination normal.  Skin: Skin is warm, dry and intact.  Psychiatric: She has a normal mood and affect. Her behavior is normal. Judgment and thought content normal.  Nursing note and vitals reviewed.   ED Course  Procedures (including critical care time) Medications  ketorolac (TORADOL) injection 60 mg (60 mg Intramuscular Given 11/18/15 1547)    Patient Vitals for the past 24 hrs:  BP Temp Temp src Pulse Resp SpO2  11/18/15 1309 145/99 mmHg 98.4 F (36.9 C) Oral 95 18 100 %    4:11 PM Reevaluation with update and discussion. After initial assessment and treatment, an updated evaluation reveals no additional complaints.  Findings discussed with patient, all questions answered. WENTZ,ELLIOTT L     Labs Review Labs Reviewed  COMPREHENSIVE METABOLIC PANEL - Abnormal; Notable for the following:    Calcium 8.8 (*)    All other components within normal limits  CBC WITH DIFFERENTIAL/PLATELET - Abnormal; Notable for the following:    Platelets 135 (*)    All other components within normal limits  BRAIN NATRIURETIC PEPTIDE    Imaging Review Dg Chest 2 View  11/18/2015  CLINICAL DATA:  Cough for 2.5 weeks. Intermittent vomiting. Initial encounter. EXAM: CHEST  2 VIEW COMPARISON:  PA and lateral chest 05/29/2015, 03/03/2015 and 01/25/2014. FINDINGS: There is cardiomegaly without edema. The patient has small bilateral pleural effusions, larger on the left. Mild left basilar airspace disease is also seen. The lungs are otherwise clear. No pneumothorax. IMPRESSION: Small bilateral pleural effusions, greater on the left. Mild left basilar airspace disease is likely atelectasis. Cardiomegaly without edema. Electronically Signed   By: Inge Rise M.D.   On: 11/18/2015 14:34   I have personally reviewed and evaluated these images and lab results as part of my  medical decision-making.   EKG Interpretation None      MDM   Final diagnoses:  Cough  Other headache syndrome    Nonspecific shortness of breath. Patient has a recent echocardiogram showing ejection fraction 30-35%, and mild grade 1 diastolic dysfunction. No evidence for pneumonia, acute CHF, metabolic instability or suggestion for impending vascular collapse.   Nursing Notes Reviewed/ Care Coordinated Applicable Imaging Reviewed Interpretation of Laboratory Data incorporated into ED treatment  The patient appears reasonably screened and/or stabilized for discharge and I doubt any other medical condition or other [facility] requiring further screening, evaluation, or treatment in the ED at this time prior to discharge.  Plan: Home Medications- Robutussin; Home Treatments- rest; return here if the recommended treatment, does not improve the symptoms; Recommended follow up- PCP 1 week and prn   Daleen Bo, MD 11/18/15 [phone]

## 2015-12-24 DIAGNOSIS — J189 Pneumonia, unspecified organism: Secondary | ICD-10-CM

## 2015-12-24 HISTORY — DX: Pneumonia, unspecified organism: J18.9

## 2015-12-26 ENCOUNTER — Other Ambulatory Visit (HOSPITAL_COMMUNITY): Payer: 59

## 2015-12-26 ENCOUNTER — Encounter (HOSPITAL_COMMUNITY): Payer: Self-pay | Admitting: Emergency Medicine

## 2015-12-26 ENCOUNTER — Emergency Department (HOSPITAL_COMMUNITY): Payer: 59

## 2015-12-26 ENCOUNTER — Inpatient Hospital Stay (HOSPITAL_COMMUNITY)
Admission: EM | Admit: 2015-12-26 | Discharge: 2015-12-30 | DRG: 291 | Disposition: A | Discharge status: Home / Self Care | Payer: 59 | Attending: Internal Medicine | Admitting: Internal Medicine

## 2015-12-26 DIAGNOSIS — Z833 Family history of diabetes mellitus: Secondary | ICD-10-CM

## 2015-12-26 DIAGNOSIS — J181 Lobar pneumonia, unspecified organism: Secondary | ICD-10-CM | POA: Diagnosis present

## 2015-12-26 DIAGNOSIS — Z8249 Family history of ischemic heart disease and other diseases of the circulatory system: Secondary | ICD-10-CM

## 2015-12-26 DIAGNOSIS — K219 Gastro-esophageal reflux disease without esophagitis: Secondary | ICD-10-CM | POA: Diagnosis present

## 2015-12-26 DIAGNOSIS — F411 Generalized anxiety disorder: Secondary | ICD-10-CM | POA: Diagnosis present

## 2015-12-26 DIAGNOSIS — Z9114 Patient's other noncompliance with medication regimen: Secondary | ICD-10-CM | POA: Diagnosis not present

## 2015-12-26 DIAGNOSIS — Z7982 Long term (current) use of aspirin: Secondary | ICD-10-CM

## 2015-12-26 DIAGNOSIS — Z90710 Acquired absence of both cervix and uterus: Secondary | ICD-10-CM

## 2015-12-26 DIAGNOSIS — I5023 Acute on chronic systolic (congestive) heart failure: Secondary | ICD-10-CM | POA: Insufficient documentation

## 2015-12-26 DIAGNOSIS — R0602 Shortness of breath: Secondary | ICD-10-CM | POA: Diagnosis not present

## 2015-12-26 DIAGNOSIS — Z91040 Latex allergy status: Secondary | ICD-10-CM | POA: Diagnosis not present

## 2015-12-26 DIAGNOSIS — R079 Chest pain, unspecified: Secondary | ICD-10-CM | POA: Diagnosis not present

## 2015-12-26 DIAGNOSIS — Z79899 Other long term (current) drug therapy: Secondary | ICD-10-CM

## 2015-12-26 DIAGNOSIS — M329 Systemic lupus erythematosus, unspecified: Secondary | ICD-10-CM | POA: Diagnosis present

## 2015-12-26 DIAGNOSIS — Z7952 Long term (current) use of systemic steroids: Secondary | ICD-10-CM | POA: Diagnosis not present

## 2015-12-26 DIAGNOSIS — Z885 Allergy status to narcotic agent status: Secondary | ICD-10-CM | POA: Diagnosis not present

## 2015-12-26 DIAGNOSIS — J9601 Acute respiratory failure with hypoxia: Secondary | ICD-10-CM | POA: Diagnosis present

## 2015-12-26 DIAGNOSIS — J189 Pneumonia, unspecified organism: Secondary | ICD-10-CM | POA: Diagnosis present

## 2015-12-26 DIAGNOSIS — J984 Other disorders of lung: Secondary | ICD-10-CM | POA: Diagnosis present

## 2015-12-26 DIAGNOSIS — I313 Pericardial effusion (noninflammatory): Secondary | ICD-10-CM | POA: Diagnosis present

## 2015-12-26 DIAGNOSIS — I420 Dilated cardiomyopathy: Secondary | ICD-10-CM | POA: Diagnosis present

## 2015-12-26 DIAGNOSIS — E876 Hypokalemia: Secondary | ICD-10-CM | POA: Diagnosis present

## 2015-12-26 DIAGNOSIS — T501X5A Adverse effect of loop [high-ceiling] diuretics, initial encounter: Secondary | ICD-10-CM | POA: Diagnosis present

## 2015-12-26 DIAGNOSIS — E119 Type 2 diabetes mellitus without complications: Secondary | ICD-10-CM | POA: Diagnosis present

## 2015-12-26 DIAGNOSIS — M797 Fibromyalgia: Secondary | ICD-10-CM | POA: Diagnosis present

## 2015-12-26 DIAGNOSIS — I5041 Acute combined systolic (congestive) and diastolic (congestive) heart failure: Principal | ICD-10-CM | POA: Diagnosis present

## 2015-12-26 DIAGNOSIS — Z794 Long term (current) use of insulin: Secondary | ICD-10-CM

## 2015-12-26 DIAGNOSIS — I10 Essential (primary) hypertension: Secondary | ICD-10-CM | POA: Diagnosis present

## 2015-12-26 DIAGNOSIS — Z91041 Radiographic dye allergy status: Secondary | ICD-10-CM | POA: Diagnosis not present

## 2015-12-26 DIAGNOSIS — J45909 Unspecified asthma, uncomplicated: Secondary | ICD-10-CM | POA: Diagnosis present

## 2015-12-26 DIAGNOSIS — M1990 Unspecified osteoarthritis, unspecified site: Secondary | ICD-10-CM | POA: Diagnosis present

## 2015-12-26 DIAGNOSIS — Z881 Allergy status to other antibiotic agents status: Secondary | ICD-10-CM

## 2015-12-26 DIAGNOSIS — R0609 Other forms of dyspnea: Secondary | ICD-10-CM

## 2015-12-26 LAB — BASIC METABOLIC PANEL
Anion gap: 12 (ref 5–15)
BUN: 11 mg/dL (ref 6–20)
CO2: 21 mmol/L — ABNORMAL LOW (ref 22–32)
Calcium: 9.1 mg/dL (ref 8.9–10.3)
Chloride: 111 mmol/L (ref 101–111)
Creatinine, Ser: 0.77 mg/dL (ref 0.44–1.00)
GFR calc Af Amer: >60 mL/min (ref >60)
GFR calc non Af Amer: >60 mL/min (ref >60)
Glucose, Bld: 85 mg/dL (ref 65–99)
Potassium: 3.1 mmol/L — ABNORMAL LOW (ref 3.5–5.1)
Sodium: 144 mmol/L (ref 135–145)

## 2015-12-26 LAB — CK TOTAL AND CKMB (NOT AT ARMC)
CK, MB: 4.8 ng/mL (ref 0.5–5.0)
Relative Index: INVALID (ref 0.0–2.5)
Total CK: 76 U/L (ref 38–234)

## 2015-12-26 LAB — CBC
HCT: 33.6 % — ABNORMAL LOW (ref 36.0–46.0)
Hemoglobin: 11 g/dL — ABNORMAL LOW (ref 12.0–15.0)
MCH: 27.8 pg (ref 26.0–34.0)
MCHC: 32.7 g/dL (ref 30.0–36.0)
MCV: 85.1 fL (ref 78.0–100.0)
Platelets: 200 10*3/uL (ref 150–400)
RBC: 3.95 MIL/uL (ref 3.87–5.11)
RDW: 12.9 % (ref 11.5–15.5)
WBC: 7.5 10*3/uL (ref 4.0–10.5)

## 2015-12-26 LAB — I-STAT TROPONIN, ED: Troponin i, poc: 0.01 ng/mL (ref 0.00–0.08)

## 2015-12-26 LAB — TROPONIN I
Troponin I: <0.03 ng/mL (ref <0.031)
Troponin I: <0.03 ng/mL (ref <0.031)

## 2015-12-26 LAB — SEDIMENTATION RATE: Sed Rate: 4 mm/hr (ref 0–22)

## 2015-12-26 LAB — BRAIN NATRIURETIC PEPTIDE: B Natriuretic Peptide: 533.5 pg/mL — ABNORMAL HIGH (ref 0.0–100.0)

## 2015-12-26 LAB — C-REACTIVE PROTEIN: CRP: <0.5 mg/dL (ref <1.0)

## 2015-12-26 MED ORDER — PHENYLEPHRINE-DM-GG 2.5-5-100 MG/5ML PO LIQD: 1.0000 | Freq: Three times a day (TID) | ORAL | Status: DC | PRN

## 2015-12-26 MED ORDER — SODIUM CHLORIDE 0.9 % IJ SOLN
3.0000 mL | Freq: Two times a day (BID) | INTRAMUSCULAR | Status: DC
  Administered 2015-12-26 – 2015-12-29 (×6): 3 mL via INTRAVENOUS

## 2015-12-26 MED ORDER — GUAIFENESIN-DM 100-10 MG/5ML PO SYRP
5.0000 mL | ORAL_SOLUTION | Freq: Three times a day (TID) | ORAL | Status: DC | PRN
  Administered 2015-12-29: 5 mL via ORAL
  Filled 2015-12-26: qty 10

## 2015-12-26 MED ORDER — ALBUTEROL SULFATE (2.5 MG/3ML) 0.083% IN NEBU
3.0000 mL | INHALATION_SOLUTION | Freq: Four times a day (QID) | RESPIRATORY_TRACT | Status: DC | PRN
  Administered 2015-12-28: 3 mL via RESPIRATORY_TRACT
  Filled 2015-12-26: qty 3

## 2015-12-26 MED ORDER — METHYLPREDNISOLONE SODIUM SUCC 125 MG IJ SOLR
60.0000 mg | INTRAMUSCULAR | Status: DC
  Administered 2015-12-26: 60 mg via INTRAVENOUS
  Filled 2015-12-26: qty 2
  Filled 2015-12-26: qty 0.96

## 2015-12-26 MED ORDER — POTASSIUM CHLORIDE CRYS ER 20 MEQ PO TBCR
40.0000 meq | EXTENDED_RELEASE_TABLET | Freq: Every day | ORAL | Status: DC
  Administered 2015-12-26 – 2015-12-30 (×5): 40 meq via ORAL
  Filled 2015-12-26 (×5): qty 2

## 2015-12-26 MED ORDER — LEVOCETIRIZINE DIHYDROCHLORIDE 5 MG PO TABS: 5.0000 mg | ORAL_TABLET | Freq: Every morning | ORAL | Status: DC

## 2015-12-26 MED ORDER — HYDROXYCHLOROQUINE SULFATE 200 MG PO TABS
200.0000 mg | ORAL_TABLET | Freq: Every morning | ORAL | Status: DC
  Administered 2015-12-27 – 2015-12-30 (×4): 200 mg via ORAL
  Filled 2015-12-26 (×4): qty 1

## 2015-12-26 MED ORDER — ONDANSETRON HCL 4 MG/2ML IJ SOLN: 4.0000 mg | Freq: Four times a day (QID) | INTRAMUSCULAR | Status: DC | PRN

## 2015-12-26 MED ORDER — ASPIRIN 81 MG PO CHEW
162.0000 mg | CHEWABLE_TABLET | Freq: Every morning | ORAL | Status: DC
  Administered 2015-12-27 – 2015-12-30 (×4): 162 mg via ORAL
  Filled 2015-12-26 (×4): qty 2

## 2015-12-26 MED ORDER — ACETAMINOPHEN 325 MG PO TABS: 650.0000 mg | ORAL_TABLET | ORAL | Status: DC | PRN

## 2015-12-26 MED ORDER — ASPIRIN 81 MG PO CHEW
324.0000 mg | CHEWABLE_TABLET | Freq: Once | ORAL | Status: AC
  Administered 2015-12-26: 324 mg via ORAL
  Filled 2015-12-26: qty 4

## 2015-12-26 MED ORDER — HYDROCODONE-ACETAMINOPHEN 5-325 MG PO TABS
1.0000 | ORAL_TABLET | Freq: Four times a day (QID) | ORAL | Status: DC | PRN
  Administered 2015-12-26 – 2015-12-27 (×2): 1 via ORAL
  Filled 2015-12-26 (×2): qty 1

## 2015-12-26 MED ORDER — IBUPROFEN 200 MG PO TABS
600.0000 mg | ORAL_TABLET | Freq: Four times a day (QID) | ORAL | Status: DC | PRN
Start: 2015-12-26 — End: 2015-12-30
  Administered 2015-12-26: 600 mg via ORAL
  Filled 2015-12-26: qty 3

## 2015-12-26 MED ORDER — LISINOPRIL 20 MG PO TABS
20.0000 mg | ORAL_TABLET | Freq: Every day | ORAL | Status: DC
  Administered 2015-12-26 – 2015-12-30 (×5): 20 mg via ORAL
  Filled 2015-12-26 (×5): qty 1

## 2015-12-26 MED ORDER — FUROSEMIDE 10 MG/ML IJ SOLN
60.0000 mg | Freq: Two times a day (BID) | INTRAMUSCULAR | Status: AC
  Administered 2015-12-26 – 2015-12-27 (×3): 60 mg via INTRAVENOUS
  Filled 2015-12-26 (×3): qty 6

## 2015-12-26 MED ORDER — ACETAMINOPHEN 500 MG PO TABS
1000.0000 mg | ORAL_TABLET | Freq: Once | ORAL | Status: AC
  Administered 2015-12-26: 1000 mg via ORAL
  Filled 2015-12-26: qty 2

## 2015-12-26 MED ORDER — FOLIC ACID 1 MG PO TABS
1.0000 mg | ORAL_TABLET | Freq: Every morning | ORAL | Status: DC
  Administered 2015-12-27 – 2015-12-30 (×4): 1 mg via ORAL
  Filled 2015-12-26 (×4): qty 1

## 2015-12-26 MED ORDER — ADULT MULTIVITAMIN W/MINERALS CH
1.0000 | ORAL_TABLET | Freq: Every morning | ORAL | Status: DC
  Administered 2015-12-27 – 2015-12-30 (×4): 1 via ORAL
  Filled 2015-12-26 (×4): qty 1

## 2015-12-26 MED ORDER — ONDANSETRON HCL 4 MG PO TABS
4.0000 mg | ORAL_TABLET | Freq: Three times a day (TID) | ORAL | Status: DC | PRN
Start: 2015-12-26 — End: 2015-12-30

## 2015-12-26 MED ORDER — ALBUTEROL SULFATE (2.5 MG/3ML) 0.083% IN NEBU: 2.5000 mg | INHALATION_SOLUTION | RESPIRATORY_TRACT | Status: DC | PRN

## 2015-12-26 MED ORDER — POTASSIUM CHLORIDE CRYS ER 20 MEQ PO TBCR
40.0000 meq | EXTENDED_RELEASE_TABLET | Freq: Once | ORAL | Status: AC
  Administered 2015-12-26: 40 meq via ORAL
  Filled 2015-12-26: qty 2

## 2015-12-26 MED ORDER — FUROSEMIDE 10 MG/ML IJ SOLN
60.0000 mg | Freq: Once | INTRAMUSCULAR | Status: AC
  Administered 2015-12-26: 60 mg via INTRAVENOUS
  Filled 2015-12-26: qty 8

## 2015-12-26 MED ORDER — SODIUM CHLORIDE 0.9 % IV SOLN: 250.0000 mL | INTRAVENOUS | Status: DC | PRN

## 2015-12-26 MED ORDER — PREDNISONE 20 MG PO TABS: 20.0000 mg | ORAL_TABLET | Freq: Every day | ORAL | Status: DC

## 2015-12-26 MED ORDER — TRAZODONE HCL 50 MG PO TABS
50.0000 mg | ORAL_TABLET | Freq: Every evening | ORAL | Status: DC | PRN
  Administered 2015-12-27 (×2): 50 mg via ORAL
  Filled 2015-12-26 (×2): qty 1

## 2015-12-26 MED ORDER — ENOXAPARIN SODIUM 40 MG/0.4ML ~~LOC~~ SOLN
40.0000 mg | SUBCUTANEOUS | Status: DC
  Administered 2015-12-26 – 2015-12-29 (×4): 40 mg via SUBCUTANEOUS
  Filled 2015-12-26 (×5): qty 0.4

## 2015-12-26 MED ORDER — LORATADINE 10 MG PO TABS
10.0000 mg | ORAL_TABLET | Freq: Every day | ORAL | Status: DC
  Administered 2015-12-27 – 2015-12-30 (×4): 10 mg via ORAL
  Filled 2015-12-26 (×4): qty 1

## 2015-12-26 MED ORDER — SODIUM CHLORIDE 0.9 % IJ SOLN
3.0000 mL | INTRAMUSCULAR | Status: DC | PRN
Start: 2015-12-26 — End: 2015-12-30

## 2015-12-26 MED ORDER — CARVEDILOL 6.25 MG PO TABS
6.2500 mg | ORAL_TABLET | Freq: Two times a day (BID) | ORAL | Status: DC
  Administered 2015-12-26 – 2015-12-30 (×8): 6.25 mg via ORAL
  Filled 2015-12-26 (×8): qty 1

## 2015-12-26 MED ORDER — GUAIFENESIN 100 MG/5ML PO SOLN: 5.0000 mL | ORAL | Status: DC | PRN

## 2015-12-26 NOTE — ED Notes (Signed)
Gave report to [place], Therapist, sports

## 2015-12-26 NOTE — ED Notes (Signed)
Pt can go to floor at 1633

## 2015-12-26 NOTE — ED Notes (Signed)
Patient presents for SOB x3 weeks, and intermittent "sharp" left sided CP x3 days. History of HTN, CHF, asthma. Patient reports bilateral swelling in legs and hands x3 months.

## 2015-12-26 NOTE — ED Notes (Signed)
MD at bedside. 

## 2015-12-26 NOTE — H&P (Signed)
[facility] History and Physical  Christine Mcdowell ZOX:[id number] DOB: 06-18-1971 DOA: 12/26/2015  Referring physician:Dr Laneta Simmers PCP: Maximino Greenland, MD   Chief Complaint:  Progressive Shortness of breath since 3 days  HPI:  46 year old female with history of lupus (see his rheumatologist at [facility]), asthma,, history of myopericarditis in 2015 treated with high-dose steroid, last Myoview without any ischemia and cardiac MRI from March 2016 with EF of 46%. Patient also has history of hypertension,? Rheumatoid arthritis associated with lupus, fibromyalgia, diabetes mellitus associated with steroid use (not on any medication at this time) who presented to the ED with 3 weeks of progressive shortness of breath. She reports dyspnea on exertion initially and with off and on leg swellings which she thought was due to steroids. However since last 3 days patient has progressive shortness of breath with orthopnea. Patient also reports some cough with chills and some chest discomfort. Reports nausea but no vomiting. Denies any headache, blurred vision, dizziness, abdominal pain, bowel or urinary symptoms. Reports poor appetite. She recently has a new insurance and most of her medications for lupus are not covered so she has not been able to refill them. Reports being compliant with her other home medications, denies any sick contact or recent travel.  In the ED patient had mildly elevated blood pressure and maintaining O2 sat on 3 L via nasal cannula. Blood work showed hemoglobin of 11, normal WBC and platelets. Chemistry shows potassium of 3.1. Chest x-ray showed left lower lobe airspace opacity with small left effusion and mild cardiomegaly. Patient given full dose aspirin and 60 mg IV Lasix. Hospitalist admission requested to telemetry. Cardiology consulted.  Review of Systems:  Constitutional: Denies fever, chills, diaphoresis, appetite change and fatigue.  HEENT: Denies visual or hearing  symptoms, congestion, sore throat, difficulty swallowing, neck pain or stiffness. Respiratory: SOB, DOE, orthopnea, denies cough, chest tightness,  and wheezing.   Cardiovascular: Denies chest pain, palpitations , leg swelling+.  Gastrointestinal: Denies nausea, vomiting, abdominal pain, diarrhea, constipation, blood in stool and abdominal distention.  Genitourinary: Denies dysuria, urgency, frequency, hematuria, flank pain and difficulty urinating.  Endocrine: Denies: hot or cold intolerance, sweats,  polyuria, polydipsia. Musculoskeletal: Arthralgias, Denies myalgias, back pain, joint swelling,  and gait problem.  Skin: Denies pallor, rash and wound.  Neurological: Denies dizziness, seizures, syncope, weakness, light-headedness, numbness and headaches.  Hematological: Denies adenopathy.  Psychiatric/Behavioral: Denies confusion   Past Medical History  Diagnosis Date  . Lupus (systemic lupus erythematosus) ([place])   . Arthritis   . Hypertension   . Asthma   . Fibromyalgia   . CHF (congestive heart failure) ([place])   . Diabetes mellitus without complication ([place])     steroid induced  . Hx of echocardiogram     Echo (9/15):  EF 50-55%, ant HK, Gr 1 DD, mild MR, mild LAE, no effusion  . Hx of cardiovascular stress test     ETT-Myoview (9/15):  No ischemia, EF 52%; NORMAL   Past Surgical History  Procedure Laterality Date  . Knee surgery    . Tubal ligation    . Abdominal hysterectomy  2009   Social History:  reports that she has never smoked. She has never used smokeless tobacco. She reports that she does not drink alcohol or use illicit drugs.  Allergies  Allergen Reactions  . Dilaudid [Hydromorphone Hcl] Nausea And Vomiting and Other (See Comments)    Raises blood pressure   . Erythromycin Nausea And Vomiting  . Hydromorphone Nausea And Vomiting  Other reaction(s): GI Upset (intolerance), Hypertension (intolerance) Raises blood pressure   . Iodinated Diagnostic Agents Other  (See Comments)    Flu like symptoms  . Latex Hives    Family History  Problem Relation Age of Onset  . Diabetes Other   . Hypertension Other   . Heart attack Father   . Heart attack Paternal Grandmother     Prior to Admission medications   Medication Sig Start Date End Date Taking? Authorizing Provider  ACTEMRA 162 MG/0.9ML SOSY Inject 162 mg into the skin every 14 (fourteen) days. 11/09/15  Yes Historical Provider, MD  albuterol (PROVENTIL HFA;VENTOLIN HFA) 108 (90 BASE) MCG/ACT inhaler Inhale 2 puffs into the lungs every 6 (six) hours as needed for wheezing or shortness of breath.   Yes Historical Provider, MD  albuterol (PROVENTIL) (2.5 MG/3ML) 0.083% nebulizer solution Take 2.5 mg by nebulization every 6 (six) hours as needed for wheezing or shortness of breath.   Yes Historical Provider, MD  aspirin 81 MG chewable tablet Chew 162 mg by mouth every morning.    Yes Historical Provider, MD  carvedilol (COREG) 12.5 MG tablet Take 0.5 tablets (6.25 mg total) by mouth 2 (two) times daily with a meal. Patient taking differently: Take 12.5 mg by mouth daily.  02/21/15  Yes Josue Hector, MD  folic acid (FOLVITE) 1 MG tablet Take 1 mg by mouth every morning.    Yes Historical Provider, MD  furosemide (LASIX) 40 MG tablet TAKE 1 TABLET BY MOUTH DAILY 09/06/15  Yes Josue Hector, MD  guaiFENesin (ROBITUSSIN) 100 MG/5ML SOLN Take 5 mLs by mouth every 4 (four) hours as needed for cough or to loosen phlegm.   Yes Historical Provider, MD  HYDROcodone-acetaminophen (NORCO/VICODIN) 5-325 MG per tablet Take 1 tablet by mouth every 8 (eight) hours as needed. pain 05/08/15  Yes Historical Provider, MD  hydrocortisone cream 1 % Apply 1 application topically 4 (four) times daily as needed for itching (rash).   Yes Historical Provider, MD  hydroxychloroquine (PLAQUENIL) 200 MG tablet Take 200 mg by mouth every morning.   Yes Historical Provider, MD  ibuprofen (ADVIL,MOTRIN) 200 MG tablet Take 600 mg by mouth  every 6 (six) hours as needed for headache or moderate pain.   Yes Historical Provider, MD  insulin aspart (NOVOLOG FLEXPEN) 100 UNIT/ML FlexPen Inject 2-10 Units into the skin 3 (three) times daily as needed for high blood sugar. SLIDING SCALE   Yes Historical Provider, MD  insulin detemir (LEVEMIR) 100 UNIT/ML injection Inject 15-30 Units into the skin 2 (two) times daily as needed (for high blood sugar). She takes 15 units in the morning and 30 units at bedtime.   Yes Historical Provider, MD  levocetirizine (XYZAL) 5 MG tablet Take 5 mg by mouth every morning.    Yes Historical Provider, MD  lisinopril (PRINIVIL,ZESTRIL) 20 MG tablet Take 20 mg by mouth daily.   Yes Historical Provider, MD  meloxicam (MOBIC) 15 MG tablet Take 15 mg by mouth daily as needed. Inflammation or pain 05/08/15  Yes Historical Provider, MD  methotrexate (RHEUMATREX) 2.5 MG tablet Take 20 mg by mouth once a week. Take 8 tablets by mouth once a week on Wednesday 02/06/15  Yes Historical Provider, MD  Multiple Vitamin (MULTIVITAMIN WITH MINERALS) TABS Take 1 tablet by mouth every morning.    Yes Historical Provider, MD  ondansetron (ZOFRAN) 4 MG tablet Take 4 mg by mouth every 8 (eight) hours as needed for nausea or vomiting.  Yes Historical Provider, MD  Phenylephrine-DM-GG (MUCINEX FAST-MAX CONGEST COUGH) 2.5-5-100 MG/5ML LIQD Take 1 Dose by mouth 3 (three) times daily as needed (cold symptoms).   Yes Historical Provider, MD  potassium chloride SA (K-DUR,KLOR-CON) 20 MEQ tablet 2 TABS  EVERY AM AND  1 TAB  EVERY PM Patient taking differently: Take 20-40 mEq by mouth 2 (two) times daily. 2 TABS  EVERY AM AND  1 TAB  EVERY PM 02/08/15  Yes Wendall Stade, MD  predniSONE (DELTASONE) 20 MG tablet Take 20 mg by mouth daily with breakfast.   Yes Historical Provider, MD  torsemide (DEMADEX) 10 MG tablet Take 10 mg by mouth daily as needed (fluid).    Yes Historical Provider, MD  traZODone (DESYREL) 50 MG tablet Take 50 mg by mouth at  bedtime as needed for sleep.  02/08/15  Yes Historical Provider, MD     Physical Exam:  Filed Vitals:   12/26/15 1141 12/26/15 1207 12/26/15 1453 12/26/15 1455  BP: 145/108 134/93  140/93  Pulse:  83  92  Temp:      TempSrc:      Resp:  18  20  SpO2:  100% 99% 100%    Constitutional: Vital signs reviewed.  Middle aged female sitting up in bed in some distress and anxious HEENT: no pallor, no icterus, moist oral mucosa, no cervical lymphadenopathy, no JVD appreciated Cardiovascular: RRR, S1 normal, S2 normal, no MRG Chest: Diminished bibasilar breath sounds, no added sounds Abdominal: Soft. Non-tender, non-distended, bowel sounds are normal, Ext: warm, trace bilateral edema  Neurological: Alert and oriented  Labs on Admission:  Basic Metabolic Panel:  Recent Labs Lab 12/26/15 1132  NA 144  K 3.1*  CL 111  CO2 21*  GLUCOSE 85  BUN 11  CREATININE 0.77  CALCIUM 9.1   Liver Function Tests: No results for input(s): AST, ALT, ALKPHOS, BILITOT, PROT, ALBUMIN in the last 168 hours. No results for input(s): LIPASE, AMYLASE in the last 168 hours. No results for input(s): AMMONIA in the last 168 hours. CBC:  Recent Labs Lab 12/26/15 1132  WBC 7.5  HGB 11.0*  HCT 33.6*  MCV 85.1  PLT 200   Cardiac Enzymes: No results for input(s): CKTOTAL, CKMB, CKMBINDEX, TROPONINI in the last 168 hours. BNP: Invalid input(s): POCBNP CBG: No results for input(s): GLUCAP in the last 168 hours.  Radiological Exams on Admission: Dg Chest 2 View  12/26/2015  CLINICAL DATA:  Chest pain. Shortness of breath, wheezing over last 2-3 days EXAM: CHEST  2 VIEW COMPARISON:  11/18/2015 FINDINGS: Cardiomegaly. Left lower lobe airspace opacity and small left effusion. Cannot exclude pneumonia. No confluent opacity on the right. No acute bony abnormality. IMPRESSION: Left lower lobe airspace opacity with small left effusion. Cannot exclude pneumonia. Mild cardiomegaly. Electronically Signed   By:  Charlett Nose M.D.   On: 12/26/2015 12:05    EKG: Normal sinus rhythm at 81, no ST-T changes    Assessment/Plan  Principal Problem:   Acute hypoxic respiratory failure secondary to combined systolic and diastolic heart failure (HCC) Admit to telemetry. Symptoms are concerning for acute myopericarditis with a lupus flare contributing to her CHF symptoms. Started on IV Lasix 60 mg twice daily. Monitor strict I/O and daily weight. 2-D echo ordered. Seen by cardiology and ordered labs to check for acute flareup. (ESR, CRP, complements, CK). Cycle troponins. Continue O2 via nasal cannula. Continue aspirin, Coreg and lisinopril. -Ordered when necessary nebs.   Active problems Lupus with possible  flareup Patient has not been able to take her medications for possible days as they do not cover with her new insurance. Tabs sent on admission. Placed on IV Solu-Medrol daily. Continue plaquenil, folic acid. Continue Vicodin for pain. Patient also receives Actemera injection every 14 days and methotrexate once a week. (Follows with rheumatologist at [facility] and has not been able to refill his medications due to new insurance).  Hypokalemia Replenish.  ? Right lower lobe pneumonia Patient denies any signs or symptoms of underlying infection. Will avoid antibiotic at this time.  Type 2 diabetes mellitus Reports to be diet controlled. Monitor on sliding scale coverage.  Essential hypertension Continue home medications      Diet:cardiac  DVT prophylaxis: sq lovenox   Code Status: full code Family Communication: friend  at bedside Disposition Plan: admit to telemetry  [place], [facility] Pager [phone]  Total time spent on admission :70 minutes  If 7PM-7AM, please contact night-coverage [email or web address] Password [email or web address] 12/26/2015, 3:23 PM

## 2015-12-26 NOTE — Consult Note (Signed)
Reason for Consult: CHF   Referring Physician: Dr. Ivar Bury- ER MD   PCP:  Maximino Greenland, MD  Primary Cardiologist: Dr. Gurney Mcdowell is an 45 y.o. female.    Chief Complaint: SOB X 67   HPI:  45 year old female with hx systemic lupus and asthma with restrictive lung disease.  Also hx of acute myopericarditis in 2015 treated with high dose steroids.  MRI 2015 with EF 46%, edema in ant wall apex and inferolateral wall consistent with lupus myocarditis.  LAE, MR, small circumferential pericardial effusion.  Follow up MRI 3/0/16 with EF 46%, similar to MRI 2015, no evidence of myocarditis, normal RA/RV/LA, and trivial post. pericardial effusion.   Follow up echo 9/15 with EF 50-55% G1DD, hypokinesis of ant. Myocardium, mild MR, LA mildly dilated.  Myovue 9/4 normal with no ischemia.  Her Lupus followed at [facility] may be some RA overlap with lupus.  On last visit in July with Dr. Johnsie Cancel she had edema from steroids, EF 46%, and event monitor for palpitations SR no arrhthymias, complaints of tachycardia did not match monitor recording of SR.  She began having SOB/cough 3 weeks ago associated with a cold, but symptoms progressed and today with SOB chest pain and with H/A.  Also complained of wt gain- 10 lbs.  By Sat she had increased DOE and at times had to prop herself up to sleep for SOB.  Today walking down 16 steps to car became very SOB had to rest and then same walking into work.  BP elevated 181/111.  She called her PCP and came to ER.   She also had chest pain described as sharp.  Today BNP in 533 1 month ago only 73.  K+ low at 3.1,  Troponin poc 0.01--- CXR with sm lt effusion Left lower lobe airspace opacity  Cannot exclude pneumonia.  Has rec'd IV lasix 60 mg has voided X 2 --EKG SR with lat. ST depression. Her chest currently feel tight. Still with SOB.  Pt prefers to be admitted to [facility].  Pt no longer has insurance that covers her medication her lupus  meds. So no steroids until last day or so. Denies eating salty foods.  Uses torsemide as needed.  Past Medical History  Diagnosis Date  . Lupus (systemic lupus erythematosus) ([place])   . Arthritis   . Hypertension   . Asthma   . Fibromyalgia   . CHF (congestive heart failure) ([place])   . 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 Hx of echocardiogram     Echo (9/15):  EF 50-55%, ant HK, Gr 1 DD, mild MR, mild LAE, no effusion  . Hx of cardiovascular stress test     ETT-Myoview (9/15):  No ischemia, EF 52%; NORMAL    Past Surgical History  Procedure Laterality Date  . Knee surgery    . Tubal ligation    . Abdominal hysterectomy  2009    Family History  Problem Relation Age of Onset  . Diabetes Other   . Hypertension Other   . Heart attack Father   . Heart attack Paternal Grandmother    Social History:  reports that she has never smoked. She has never used smokeless tobacco. She reports that she does not drink alcohol or use illicit drugs.  Allergies:  Allergies  Allergen Reactions  . Dilaudid [Hydromorphone Hcl] Nausea And Vomiting and Other (See Comments)  Raises blood pressure   . Erythromycin Nausea And Vomiting  . Hydromorphone Nausea And Vomiting    Other reaction(s): GI Upset (intolerance), Hypertension (intolerance) Raises blood pressure   . Iodinated Diagnostic Agents Other (See Comments)    Flu like symptoms  . Latex Hives    OUTPATIENT MEDICATIONS: No current facility-administered medications on file prior to encounter.   Current Outpatient Prescriptions on File Prior to Encounter  Medication Sig Dispense Refill  . ACTEMRA 162 MG/0.9ML SOSY Inject 162 mg into the skin every 14 (fourteen) days.    Marland Kitchen albuterol (PROVENTIL HFA;VENTOLIN HFA) 108 (90 BASE) MCG/ACT inhaler Inhale 2 puffs into the lungs every 6 (six) hours as needed for wheezing or shortness of breath.    Marland Kitchen albuterol (PROVENTIL) (2.5 MG/3ML) 0.083% nebulizer solution Take  2.5 mg by nebulization every 6 (six) hours as needed for wheezing or shortness of breath.    Marland Kitchen aspirin 81 MG chewable tablet Chew 162 mg by mouth every morning.     . carvedilol (COREG) 12.5 MG tablet Take 0.5 tablets (6.25 mg total) by mouth 2 (two) times daily with a meal. (Patient taking differently: Take 12.5 mg by mouth daily. ) 60 tablet 11  . folic acid (FOLVITE) 1 MG tablet Take 1 mg by mouth every morning.     . furosemide (LASIX) 40 MG tablet TAKE 1 TABLET BY MOUTH DAILY 30 tablet 2  . HYDROcodone-acetaminophen (NORCO/VICODIN) 5-325 MG per tablet Take 1 tablet by mouth every 8 (eight) hours as needed. pain    . hydroxychloroquine (PLAQUENIL) 200 MG tablet Take 200 mg by mouth every morning.    . insulin aspart (NOVOLOG FLEXPEN) 100 UNIT/ML FlexPen Inject 2-10 Units into the skin 3 (three) times daily as needed for high blood sugar. SLIDING SCALE    . insulin detemir (LEVEMIR) 100 UNIT/ML injection Inject 15-30 Units into the skin 2 (two) times daily as needed (for high blood sugar). She takes 15 units in the morning and 30 units at bedtime.    Marland Kitchen levocetirizine (XYZAL) 5 MG tablet Take 5 mg by mouth every morning.     Marland Kitchen lisinopril (PRINIVIL,ZESTRIL) 20 MG tablet Take 20 mg by mouth daily.    . meloxicam (MOBIC) 15 MG tablet Take 15 mg by mouth daily as needed. Inflammation or pain    . methotrexate (RHEUMATREX) 2.5 MG tablet Take 20 mg by mouth once a week. Take 8 tablets by mouth once a week on Wednesday  3  . Multiple Vitamin (MULTIVITAMIN WITH MINERALS) TABS Take 1 tablet by mouth every morning.     . ondansetron (ZOFRAN) 4 MG tablet Take 4 mg by mouth every 8 (eight) hours as needed for nausea or vomiting.    . potassium chloride SA (K-DUR,KLOR-CON) 20 MEQ tablet 2 TABS  EVERY AM AND  1 TAB  EVERY PM (Patient taking differently: Take 20-40 mEq by mouth 2 (two) times daily. 2 TABS  EVERY AM AND  1 TAB  EVERY PM) 90 tablet 11  . predniSONE (DELTASONE) 20 MG tablet Take 20 mg by mouth daily  with breakfast.    . torsemide (DEMADEX) 10 MG tablet Take 10 mg by mouth daily as needed (fluid).     . traZODone (DESYREL) 50 MG tablet Take 50 mg by mouth at bedtime as needed for sleep.   5     Results for orders placed or performed during the hospital encounter of 12/26/15 (from the past 48 hour(s))  Basic metabolic panel  Status: Abnormal   Collection Time: 12/26/15 11:32 AM  Result Value Ref Range   Sodium 144 135 - 145 mmol/L   Potassium 3.1 (L) 3.5 - 5.1 mmol/L   Chloride 111 101 - 111 mmol/L   CO2 21 (L) 22 - 32 mmol/L   Glucose, Bld 85 65 - 99 mg/dL   BUN 11 6 - 20 mg/dL   Creatinine, Ser 0.77 0.44 - 1.00 mg/dL   Calcium 9.1 8.9 - 10.3 mg/dL   GFR calc non Af Amer >60 >60 mL/min   GFR calc Af Amer >60 >60 mL/min    Comment: (NOTE) The eGFR has been calculated using the CKD EPI equation. This calculation has not been validated in all clinical situations. eGFR's persistently <60 mL/min signify possible Chronic Kidney Disease.    Anion gap 12 5 - 15  CBC     Status: Abnormal   Collection Time: 12/26/15 11:32 AM  Result Value Ref Range   WBC 7.5 4.0 - 10.5 K/uL   RBC 3.95 3.87 - 5.11 MIL/uL   Hemoglobin 11.0 (L) 12.0 - 15.0 g/dL   HCT 33.6 (L) 36.0 - 46.0 %   MCV 85.1 78.0 - 100.0 fL   MCH 27.8 26.0 - 34.0 pg   MCHC 32.7 30.0 - 36.0 g/dL   RDW 12.9 11.5 - 15.5 %   Platelets 200 150 - 400 K/uL  Brain natriuretic peptide     Status: Abnormal   Collection Time: 12/26/15 11:32 AM  Result Value Ref Range   B Natriuretic Peptide 533.5 (H) 0.0 - 100.0 pg/mL  I-stat troponin, ED (not at [facility], [facility])     Status: None   Collection Time: 12/26/15 11:46 AM  Result Value Ref Range   Troponin i, poc 0.01 0.00 - 0.08 ng/mL   Comment 3            Comment: Due to the release kinetics of cTnI, a negative result within the first hours of the onset of symptoms does not rule out myocardial infarction with certainty. If myocardial infarction is still suspected, repeat the test  at appropriate intervals.    Dg Chest 2 View  12/26/2015  CLINICAL DATA:  Chest pain. Shortness of breath, wheezing over last 2-3 days EXAM: CHEST  2 VIEW COMPARISON:  11/18/2015 FINDINGS: Cardiomegaly. Left lower lobe airspace opacity and small left effusion. Cannot exclude pneumonia. No confluent opacity on the right. No acute bony abnormality. IMPRESSION: Left lower lobe airspace opacity with small left effusion. Cannot exclude pneumonia. Mild cardiomegaly. Electronically Signed   By: Rolm Baptise M.D.   On: 12/26/2015 12:05    ROS: General:+ colds no fevers, + weight increase 10 lbs Skin:no rashes or ulcers HEENT:no blurred vision, earlier congestion has resolved CV:see HPI PUL:see HPI GI:no diarrhea constipation or melena, no indigestion GU:no hematuria, no dysuria MS:no joint pain, no claudication Neuro:no syncope, no lightheadedness Endo:+ diabetes, no thyroid disease   Blood pressure 134/93, pulse 83, temperature 98.4 F (36.9 C), temperature source Oral, resp. rate 18, SpO2 100 %.  Wt Readings from Last 3 Encounters:  07/10/15 170 lb 1.9 oz (77.166 kg)  05/29/15 176 lb (79.833 kg)  03/03/15 173 lb 12.8 oz (78.835 kg)    PE: General:Pleasant affect, NAD Skin:Warm and dry, brisk capillary refill HEENT:normocephalic, sclera clear, mucus membranes moist Neck:supple, no JVD, no bruits sitting up with lunch Heart:S1S2 RRR without murmur, gallup, rub or click Lungs:diminished without rales, rhonchi, or wheezes UUV:OZDG, non tender, + BS, do not palpate  liver spleen or masses Ext:tr lower ext edema-more of feet, 2+ pedal pulses, 2+ radial pulses Neuro:alert and oriented X 3, MAE, follows commands, + facial symmetry    Assessment/Plan Principal Problem:   Acute combined systolic and diastolic heart failure (HCC)-diuresis difficult to know how much lupus flare in combo with HF and with chest pain concern for lupus - may need MRI   Active Problems:   Shortness of breath   SLE  (systemic lupus erythematosus) (HCC) has been off  meds plan to use steroids, check Lupus labs.    Chronic systolic heart failure [facility])    DGLOVF,IEPPI R  Nurse Practitioner Certified [place] Pager [phone] or after 5pm or weekends call [phone] 12/26/2015, 2:20 PM

## 2015-12-26 NOTE — ED Notes (Signed)
RN to try and draw labs

## 2015-12-26 NOTE — H&P (Deleted)
       Reason for Consult: CHF   Referring Physician: Dr. D Knott- ER MD   PCP:  SANDERS,ROBYN N, MD  Primary Cardiologist: Dr. Nishan  Christine Mcdowell is an 45 y.o. female.    Chief Complaint: SOB X 3   HPI:  45 year old female with hx systemic lupus and asthma with restrictive lung disease.  Also hx of acute myopericarditis in 2015 treated with high dose steroids.  MRI 2015 with EF 46%, edema in ant wall apex and inferolateral wall consistent with lupus myocarditis.  LAE, MR, small circumferential pericardial effusion.  Follow up MRI 3/0/16 with EF 46%, similar to MRI 2015, no evidence of myocarditis, normal RA/RV/LA, and trivial post. pericardial effusion.   Follow up echo 9/15 with EF 50-55% G1DD, hypokinesis of ant. Myocardium, mild MR, LA mildly dilated.  Myovue 9/4 normal with no ischemia.  Her Lupus followed at [facility] may be some RA overlap with lupus.  On last visit in July with Dr. Nishan she had edema from steroids, EF 46%, and event monitor for palpitations SR no arrhthymias, complaints of tachycardia did not match monitor recording of SR.  She began having SOB/cough 3 weeks ago associated with a cold, but symptoms progressed and today with SOB chest pain and with H/A.  Also complained of wt gain- 10 lbs.  By Sat she had increased DOE and at times had to prop herself up to sleep for SOB.  Today walking down 16 steps to car became very SOB had to rest and then same walking into work.  BP elevated 181/111.  She called her PCP and came to ER.   She also had chest pain described as sharp.  Today BNP in 533 1 month ago only 73.  K+ low at 3.1,  Troponin poc 0.01--- CXR with sm lt effusion Left lower lobe airspace opacity  Cannot exclude pneumonia.  Has rec'd IV lasix 60 mg has voided X 2 --EKG SR with lat. ST depression. Her chest currently feel tight. Still with SOB.  Pt prefers to be admitted to [facility].  Pt no longer has insurance that covers her medication her lupus  meds. So no steroids until last day or so. Denies eating salty foods.  Uses torsemide as needed.  Past Medical History  Diagnosis Date  . Lupus (systemic lupus erythematosus) ([facility])   . Arthritis   . Hypertension   . Asthma   . Fibromyalgia   . CHF (congestive heart failure) ([facility])   . Diabetes mellitus without complication ([facility])     steroid induced  . Hx of echocardiogram     Echo (9/15):  EF 50-55%, ant HK, Gr 1 DD, mild MR, mild LAE, no effusion  . Hx of cardiovascular stress test     ETT-Myoview (9/15):  No ischemia, EF 52%; NORMAL    Past Surgical History  Procedure Laterality Date  . Knee surgery    . Tubal ligation    . Abdominal hysterectomy  2009    Family History  Problem Relation Age of Onset  . Diabetes Other   . Hypertension Other   . Heart attack Father   . Heart attack Paternal Grandmother    Social History:  reports that she has never smoked. She has never used smokeless tobacco. She reports that she does not drink alcohol or use illicit drugs.  Allergies:  Allergies  Allergen Reactions  . Dilaudid [Hydromorphone Hcl] Nausea And Vomiting and Other (See Comments)      Raises blood pressure   . Erythromycin Nausea And Vomiting  . Hydromorphone Nausea And Vomiting    Other reaction(s): GI Upset (intolerance), Hypertension (intolerance) Raises blood pressure   . Iodinated Diagnostic Agents Other (See Comments)    Flu like symptoms  . Latex Hives    OUTPATIENT MEDICATIONS: No current facility-administered medications on file prior to encounter.   Current Outpatient Prescriptions on File Prior to Encounter  Medication Sig Dispense Refill  . ACTEMRA 162 MG/0.9ML SOSY Inject 162 mg into the skin every 14 (fourteen) days.    . albuterol (PROVENTIL HFA;VENTOLIN HFA) 108 (90 BASE) MCG/ACT inhaler Inhale 2 puffs into the lungs every 6 (six) hours as needed for wheezing or shortness of breath.    . albuterol (PROVENTIL) (2.5 MG/3ML) 0.083% nebulizer solution Take  2.5 mg by nebulization every 6 (six) hours as needed for wheezing or shortness of breath.    . aspirin 81 MG chewable tablet Chew 162 mg by mouth every morning.     . carvedilol (COREG) 12.5 MG tablet Take 0.5 tablets (6.25 mg total) by mouth 2 (two) times daily with a meal. (Patient taking differently: Take 12.5 mg by mouth daily. ) 60 tablet 11  . folic acid (FOLVITE) 1 MG tablet Take 1 mg by mouth every morning.     . furosemide (LASIX) 40 MG tablet TAKE 1 TABLET BY MOUTH DAILY 30 tablet 2  . HYDROcodone-acetaminophen (NORCO/VICODIN) 5-325 MG per tablet Take 1 tablet by mouth every 8 (eight) hours as needed. pain    . hydroxychloroquine (PLAQUENIL) 200 MG tablet Take 200 mg by mouth every morning.    . insulin aspart (NOVOLOG FLEXPEN) 100 UNIT/ML FlexPen Inject 2-10 Units into the skin 3 (three) times daily as needed for high blood sugar. SLIDING SCALE    . insulin detemir (LEVEMIR) 100 UNIT/ML injection Inject 15-30 Units into the skin 2 (two) times daily as needed (for high blood sugar). She takes 15 units in the morning and 30 units at bedtime.    . levocetirizine (XYZAL) 5 MG tablet Take 5 mg by mouth every morning.     . lisinopril (PRINIVIL,ZESTRIL) 20 MG tablet Take 20 mg by mouth daily.    . meloxicam (MOBIC) 15 MG tablet Take 15 mg by mouth daily as needed. Inflammation or pain    . methotrexate (RHEUMATREX) 2.5 MG tablet Take 20 mg by mouth once a week. Take 8 tablets by mouth once a week on Wednesday  3  . Multiple Vitamin (MULTIVITAMIN WITH MINERALS) TABS Take 1 tablet by mouth every morning.     . ondansetron (ZOFRAN) 4 MG tablet Take 4 mg by mouth every 8 (eight) hours as needed for nausea or vomiting.    . potassium chloride SA (K-DUR,KLOR-CON) 20 MEQ tablet 2 TABS  EVERY AM AND  1 TAB  EVERY PM (Patient taking differently: Take 20-40 mEq by mouth 2 (two) times daily. 2 TABS  EVERY AM AND  1 TAB  EVERY PM) 90 tablet 11  . predniSONE (DELTASONE) 20 MG tablet Take 20 mg by mouth daily  with breakfast.    . torsemide (DEMADEX) 10 MG tablet Take 10 mg by mouth daily as needed (fluid).     . traZODone (DESYREL) 50 MG tablet Take 50 mg by mouth at bedtime as needed for sleep.   5     Results for orders placed or performed during the hospital encounter of 12/26/15 (from the past 48 hour(s))  Basic metabolic panel       Status: Abnormal   Collection Time: 12/26/15 11:32 AM  Result Value Ref Range   Sodium 144 135 - 145 mmol/L   Potassium 3.1 (L) 3.5 - 5.1 mmol/L   Chloride 111 101 - 111 mmol/L   CO2 21 (L) 22 - 32 mmol/L   Glucose, Bld 85 65 - 99 mg/dL   BUN 11 6 - 20 mg/dL   Creatinine, Ser 0.77 0.44 - 1.00 mg/dL   Calcium 9.1 8.9 - 10.3 mg/dL   GFR calc non Af Amer >60 >60 mL/min   GFR calc Af Amer >60 >60 mL/min    Comment: (NOTE) The eGFR has been calculated using the CKD EPI equation. This calculation has not been validated in all clinical situations. eGFR's persistently <60 mL/min signify possible Chronic Kidney Disease.    Anion gap 12 5 - 15  CBC     Status: Abnormal   Collection Time: 12/26/15 11:32 AM  Result Value Ref Range   WBC 7.5 4.0 - 10.5 K/uL   RBC 3.95 3.87 - 5.11 MIL/uL   Hemoglobin 11.0 (L) 12.0 - 15.0 g/dL   HCT 33.6 (L) 36.0 - 46.0 %   MCV 85.1 78.0 - 100.0 fL   MCH 27.8 26.0 - 34.0 pg   MCHC 32.7 30.0 - 36.0 g/dL   RDW 12.9 11.5 - 15.5 %   Platelets 200 150 - 400 K/uL  Brain natriuretic peptide     Status: Abnormal   Collection Time: 12/26/15 11:32 AM  Result Value Ref Range   B Natriuretic Peptide 533.5 (H) 0.0 - 100.0 pg/mL  I-stat troponin, ED (not at [facility], [facility])     Status: None   Collection Time: 12/26/15 11:46 AM  Result Value Ref Range   Troponin i, poc 0.01 0.00 - 0.08 ng/mL   Comment 3            Comment: Due to the release kinetics of cTnI, a negative result within the first hours of the onset of symptoms does not rule out myocardial infarction with certainty. If myocardial infarction is still suspected, repeat the test  at appropriate intervals.    Dg Chest 2 View  12/26/2015  CLINICAL DATA:  Chest pain. Shortness of breath, wheezing over last 2-3 days EXAM: CHEST  2 VIEW COMPARISON:  11/18/2015 FINDINGS: Cardiomegaly. Left lower lobe airspace opacity and small left effusion. Cannot exclude pneumonia. No confluent opacity on the right. No acute bony abnormality. IMPRESSION: Left lower lobe airspace opacity with small left effusion. Cannot exclude pneumonia. Mild cardiomegaly. Electronically Signed   By: Kevin  Dover M.D.   On: 12/26/2015 12:05    ROS: General:+ colds no fevers, + weight increase 10 lbs Skin:no rashes or ulcers HEENT:no blurred vision, earlier congestion has resolved CV:see HPI PUL:see HPI GI:no diarrhea constipation or melena, no indigestion GU:no hematuria, no dysuria MS:no joint pain, no claudication Neuro:no syncope, no lightheadedness Endo:+ diabetes, no thyroid disease   Blood pressure 134/93, pulse 83, temperature 98.4 F (36.9 C), temperature source Oral, resp. rate 18, SpO2 100 %.  Wt Readings from Last 3 Encounters:  07/10/15 170 lb 1.9 oz (77.166 kg)  05/29/15 176 lb (79.833 kg)  03/03/15 173 lb 12.8 oz (78.835 kg)    PE: General:Pleasant affect, NAD Skin:Warm and dry, brisk capillary refill HEENT:normocephalic, sclera clear, mucus membranes moist Neck:supple, no JVD, no bruits sitting up with lunch Heart:S1S2 RRR without murmur, gallup, rub or click Lungs:diminished without rales, rhonchi, or wheezes Abd:soft, non tender, + BS, do not palpate   liver spleen or masses Ext:tr lower ext edema-more of feet, 2+ pedal pulses, 2+ radial pulses Neuro:alert and oriented X 3, MAE, follows commands, + facial symmetry    Assessment/Plan Principal Problem:   Acute combined systolic and diastolic heart failure (HCC)-diuresis difficult to know how much lupus flare in combo with HF and with chest pain concern for lupus - may need MRI   Active Problems:   Shortness of breath   SLE  (systemic lupus erythematosus) (HCC) has been off  meds plan to use steroids, check Lupus labs.    Chronic systolic heart failure (HCC)    INGOLD,LAURA R  Nurse Practitioner Certified [facility] Pager [phone] or after 5pm or weekends call [phone] 12/26/2015, 2:20 PM      

## 2015-12-26 NOTE — ED Provider Notes (Signed)
CSN: [id number]     Arrival date & time 12/26/15  1045 History   First MD Initiated Contact with Patient 12/26/15 1122     Chief Complaint  Patient presents with  . Hypertension  . Shortness of Breath     (Consider location/radiation/quality/duration/timing/severity/associated sxs/prior Treatment) Patient is a 45 y.o. female presenting with shortness of breath. The history is provided by the patient.  Shortness of Breath Severity:  Moderate Onset quality:  Gradual Duration:  3 weeks Timing:  Constant Progression:  Worsening Chronicity:  New Context: not URI   Relieved by:  Nothing Worsened by:  Activity, movement and exertion Ineffective treatments:  Diuretics Associated symptoms: no abdominal pain, no fever, no hemoptysis, no sputum production, no vomiting and no wheezing     Past Medical History  Diagnosis Date  . Lupus (systemic lupus erythematosus) ([place])   . Arthritis   . Hypertension   . Asthma   . Fibromyalgia   . CHF (congestive heart failure) ([place])   . Diabetes mellitus without complication ([place])     steroid induced  . Hx of echocardiogram     Echo (9/15):  EF 50-55%, ant HK, Gr 1 DD, mild MR, mild LAE, no effusion  . Hx of cardiovascular stress test     ETT-Myoview (9/15):  No ischemia, EF 52%; NORMAL   Past Surgical History  Procedure Laterality Date  . Knee surgery    . Tubal ligation    . Abdominal hysterectomy  2009   Family History  Problem Relation Age of Onset  . Diabetes Other   . Hypertension Other   . Heart attack Father   . Heart attack Paternal Grandmother    Social History  Substance Use Topics  . Smoking status: Never Smoker   . Smokeless tobacco: Never Used  . Alcohol Use: No   OB History    No data available     Review of Systems  Constitutional: Negative for fever.  Respiratory: Positive for shortness of breath. Negative for hemoptysis, sputum production and wheezing.   Gastrointestinal: Negative for vomiting and abdominal  pain.  All other systems reviewed and are negative.     Allergies  Dilaudid; Erythromycin; Hydromorphone; Iodinated diagnostic agents; and Latex  Home Medications   Prior to Admission medications   Medication Sig Start Date End Date Taking? Authorizing Provider  ACTEMRA 162 MG/0.9ML SOSY Inject 162 mg into the skin every 14 (fourteen) days. 11/09/15  Yes Historical Provider, MD  albuterol (PROVENTIL HFA;VENTOLIN HFA) 108 (90 BASE) MCG/ACT inhaler Inhale 2 puffs into the lungs every 6 (six) hours as needed for wheezing or shortness of breath.   Yes Historical Provider, MD  albuterol (PROVENTIL) (2.5 MG/3ML) 0.083% nebulizer solution Take 2.5 mg by nebulization every 6 (six) hours as needed for wheezing or shortness of breath.   Yes Historical Provider, MD  aspirin 81 MG chewable tablet Chew 162 mg by mouth every morning.    Yes Historical Provider, MD  carvedilol (COREG) 12.5 MG tablet Take 0.5 tablets (6.25 mg total) by mouth 2 (two) times daily with a meal. Patient taking differently: Take 12.5 mg by mouth daily.  02/21/15  Yes Josue Hector, MD  folic acid (FOLVITE) 1 MG tablet Take 1 mg by mouth every morning.    Yes Historical Provider, MD  furosemide (LASIX) 40 MG tablet TAKE 1 TABLET BY MOUTH DAILY 09/06/15  Yes Josue Hector, MD  guaiFENesin (ROBITUSSIN) 100 MG/5ML SOLN Take 5 mLs by mouth every 4 (four)  hours as needed for cough or to loosen phlegm.   Yes Historical Provider, MD  HYDROcodone-acetaminophen (NORCO/VICODIN) 5-325 MG per tablet Take 1 tablet by mouth every 8 (eight) hours as needed. pain 05/08/15  Yes Historical Provider, MD  hydrocortisone cream 1 % Apply 1 application topically 4 (four) times daily as needed for itching (rash).   Yes Historical Provider, MD  hydroxychloroquine (PLAQUENIL) 200 MG tablet Take 200 mg by mouth every morning.   Yes Historical Provider, MD  ibuprofen (ADVIL,MOTRIN) 200 MG tablet Take 600 mg by mouth every 6 (six) hours as needed for headache  or moderate pain.   Yes Historical Provider, MD  insulin aspart (NOVOLOG FLEXPEN) 100 UNIT/ML FlexPen Inject 2-10 Units into the skin 3 (three) times daily as needed for high blood sugar. SLIDING SCALE   Yes Historical Provider, MD  insulin detemir (LEVEMIR) 100 UNIT/ML injection Inject 15-30 Units into the skin 2 (two) times daily as needed (for high blood sugar). She takes 15 units in the morning and 30 units at bedtime.   Yes Historical Provider, MD  levocetirizine (XYZAL) 5 MG tablet Take 5 mg by mouth every morning.    Yes Historical Provider, MD  lisinopril (PRINIVIL,ZESTRIL) 20 MG tablet Take 20 mg by mouth daily.   Yes Historical Provider, MD  meloxicam (MOBIC) 15 MG tablet Take 15 mg by mouth daily as needed. Inflammation or pain 05/08/15  Yes Historical Provider, MD  methotrexate (RHEUMATREX) 2.5 MG tablet Take 20 mg by mouth once a week. Take 8 tablets by mouth once a week on Wednesday 02/06/15  Yes Historical Provider, MD  Multiple Vitamin (MULTIVITAMIN WITH MINERALS) TABS Take 1 tablet by mouth every morning.    Yes Historical Provider, MD  ondansetron (ZOFRAN) 4 MG tablet Take 4 mg by mouth every 8 (eight) hours as needed for nausea or vomiting.   Yes Historical Provider, MD  Phenylephrine-DM-GG (MUCINEX FAST-MAX CONGEST COUGH) 2.5-5-100 MG/5ML LIQD Take 1 Dose by mouth 3 (three) times daily as needed (cold symptoms).   Yes Historical Provider, MD  potassium chloride SA (K-DUR,KLOR-CON) 20 MEQ tablet 2 TABS  EVERY AM AND  1 TAB  EVERY PM Patient taking differently: Take 20-40 mEq by mouth 2 (two) times daily. 2 TABS  EVERY AM AND  1 TAB  EVERY PM 02/08/15  Yes Josue Hector, MD  predniSONE (DELTASONE) 20 MG tablet Take 20 mg by mouth daily with breakfast.   Yes Historical Provider, MD  torsemide (DEMADEX) 10 MG tablet Take 10 mg by mouth daily as needed (fluid).    Yes Historical Provider, MD  traZODone (DESYREL) 50 MG tablet Take 50 mg by mouth at bedtime as needed for sleep.  02/08/15  Yes  Historical Provider, MD   BP 134/93 mmHg  Pulse 83  Temp(Src) 98.4 F (36.9 C) (Oral)  Resp 18  SpO2 100% Physical Exam  Constitutional: She is oriented to person, place, and time. She appears well-developed and well-nourished. No distress.  HENT:  Head: Normocephalic.  Eyes: Conjunctivae are normal.  Neck: Neck supple. No tracheal deviation present.  Cardiovascular: Normal rate, regular rhythm and normal heart sounds.   Pulmonary/Chest: Effort normal. No respiratory distress. She has rales (bilateral to mid lung field).  Abdominal: Soft. She exhibits no distension.  Neurological: She is alert and oriented to person, place, and time.  Skin: Skin is warm and dry.  Psychiatric: She has a normal mood and affect.    ED Course  Procedures (including critical care time)  Labs Review Labs Reviewed  BASIC METABOLIC PANEL - Abnormal; Notable for the following:    Potassium 3.1 (*)    CO2 21 (*)    All other components within normal limits  CBC - Abnormal; Notable for the following:    Hemoglobin 11.0 (*)    HCT 33.6 (*)    All other components within normal limits  BRAIN NATRIURETIC PEPTIDE - Abnormal; Notable for the following:    B Natriuretic Peptide 533.5 (*)    All other components within normal limits  I-STAT TROPOININ, ED    Imaging Review Dg Chest 2 View  12/26/2015  CLINICAL DATA:  Chest pain. Shortness of breath, wheezing over last 2-3 days EXAM: CHEST  2 VIEW COMPARISON:  11/18/2015 FINDINGS: Cardiomegaly. Left lower lobe airspace opacity and small left effusion. Cannot exclude pneumonia. No confluent opacity on the right. No acute bony abnormality. IMPRESSION: Left lower lobe airspace opacity with small left effusion. Cannot exclude pneumonia. Mild cardiomegaly. Electronically Signed   By: Rolm Baptise M.D.   On: 12/26/2015 12:05   I have personally reviewed and evaluated these images and lab results as part of my medical decision-making.   EKG  Interpretation   Date/Time:  Tuesday December 26 2015 11:19:22 EST Ventricular Rate:  81 PR Interval:  199 QRS Duration: 85 QT Interval:  428 QTC Calculation: 497 R Axis:   -18 Text Interpretation:  Sinus rhythm Probable left atrial enlargement  Borderline left axis deviation T wave abnormality, consider lateral  ischemia Confirmed by KNOTT MD, DANIEL [id number]) on 12/26/2015 11:22:48 AM      MDM   Final diagnoses:  Acute on chronic systolic congestive heart failure ([place])    45 year old female with history of CHF and lupus presents with ongoing shortness of breath, swelling of bilateral hands and legs, and shortness of breath over the last 2-3 weeks. Symptoms and clinical findings consistent with CHF exacerbation. Increasingly suspicious with elevated BNP and chest x-ray findings typical of the same. Started diuresis in the ED, discussed with cardiology who recommended medical admission for continued diuresis and will consult on an inpatient basis. Hospitalist was consulted for admission and will see the patient in the emergency department.     Leo Grosser, MD 12/27/15 [phone]

## 2015-12-27 ENCOUNTER — Inpatient Hospital Stay (HOSPITAL_COMMUNITY): Payer: 59

## 2015-12-27 DIAGNOSIS — J181 Lobar pneumonia, unspecified organism: Secondary | ICD-10-CM

## 2015-12-27 DIAGNOSIS — R079 Chest pain, unspecified: Secondary | ICD-10-CM

## 2015-12-27 DIAGNOSIS — R0602 Shortness of breath: Secondary | ICD-10-CM

## 2015-12-27 LAB — BASIC METABOLIC PANEL
Anion gap: 10 (ref 5–15)
BUN: 11 mg/dL (ref 6–20)
CO2: 24 mmol/L (ref 22–32)
Calcium: 8.6 mg/dL — ABNORMAL LOW (ref 8.9–10.3)
Chloride: 106 mmol/L (ref 101–111)
Creatinine, Ser: 0.67 mg/dL (ref 0.44–1.00)
GFR calc Af Amer: >60 mL/min (ref >60)
GFR calc non Af Amer: >60 mL/min (ref >60)
Glucose, Bld: 133 mg/dL — ABNORMAL HIGH (ref 65–99)
Potassium: 4 mmol/L (ref 3.5–5.1)
Sodium: 140 mmol/L (ref 135–145)

## 2015-12-27 LAB — COMPLEMENT, TOTAL: Compl, Total (CH50): >60 U/mL (ref 42–60)

## 2015-12-27 LAB — ANTI-DNA ANTIBODY, DOUBLE-STRANDED: ds DNA Ab: 1 IU/mL (ref 0–9)

## 2015-12-27 LAB — C3 COMPLEMENT: C3 Complement: 115 mg/dL (ref 82–167)

## 2015-12-27 LAB — TROPONIN I: Troponin I: <0.03 ng/mL (ref <0.031)

## 2015-12-27 LAB — C4 COMPLEMENT: Complement C4, Body Fluid: 18 mg/dL (ref 14–44)

## 2015-12-27 MED ORDER — DEXTROSE 5 % IV SOLN
500.0000 mg | INTRAVENOUS | Status: DC
  Administered 2015-12-27 – 2015-12-29 (×3): 500 mg via INTRAVENOUS
  Filled 2015-12-27 (×3): qty 500

## 2015-12-27 MED ORDER — BUTALBITAL-APAP-CAFFEINE 50-325-40 MG PO TABS
1.0000 | ORAL_TABLET | Freq: Once | ORAL | Status: AC
  Administered 2015-12-27: 1 via ORAL
  Filled 2015-12-27: qty 1

## 2015-12-27 MED ORDER — FUROSEMIDE 40 MG PO TABS
40.0000 mg | ORAL_TABLET | Freq: Every day | ORAL | Status: DC
  Administered 2015-12-28 – 2015-12-30 (×3): 40 mg via ORAL
  Filled 2015-12-27 (×3): qty 1

## 2015-12-27 MED ORDER — KETOROLAC TROMETHAMINE 30 MG/ML IJ SOLN
30.0000 mg | Freq: Once | INTRAMUSCULAR | Status: AC
  Administered 2015-12-27: 30 mg via INTRAVENOUS
  Filled 2015-12-27: qty 1

## 2015-12-27 MED ORDER — PREDNISONE 20 MG PO TABS
20.0000 mg | ORAL_TABLET | Freq: Every day | ORAL | Status: DC
  Administered 2015-12-28 – 2015-12-30 (×3): 20 mg via ORAL
  Filled 2015-12-27 (×3): qty 1

## 2015-12-27 NOTE — Progress Notes (Signed)
Subjective: Feeling better, some mild chest pressure continues and now only DOE.  Her headache is her greatest complaint.  Objective: Vital signs in last 24 hours: Temp:  [97.9 F (36.6 C)-98.4 F (36.9 C)] 98.2 F (36.8 C) (01/04 0603) Pulse Rate:  [76-92] 76 (01/04 0603) Resp:  [18-22] 18 (01/04 0603) BP: (119-145)/(73-108) 119/73 mmHg (01/04 0603) SpO2:  [97 %-100 %] 100 % (01/04 0603) Weight:  [171 lb 1.2 oz (77.6 kg)-171 lb 3.2 oz (77.656 kg)] 171 lb 1.2 oz (77.6 kg) (01/04 0603) Weight change:  Last BM Date: 12/26/15 Intake/Output from previous day: -1500 01/03 0701 - 01/04 0700 In: -  Out: 1150 [Urine:1150] Intake/Output this shift:    PE: General:Pleasant affect, NAD Skin:Warm and dry, brisk capillary refill HEENT:normocephalic, sclera clear, mucus membranes moist Heart:S1S2 RRR without murmur, gallup, rub or click Lungs:clear without rales, rhonchi, or wheezes [id number], non tender, + BS, do not palpate liver spleen or masses Ext:no lower ext edema, 2+ pedal pulses, 2+ radial pulses Neuro:alert and oriented X 3, MAE, follows commands, + facial symmetry Tele: SR- some ST with exertion, occ PVCs  Lab Results:  Recent Labs  12/26/15 1132  WBC 7.5  HGB 11.0*  HCT 33.6*  PLT 200   BMET  Recent Labs  12/26/15 1132 12/27/15 0330  NA 144 140  K 3.1* 4.0  CL 111 106  CO2 21* 24  GLUCOSE 85 133*  BUN 11 11  CREATININE 0.77 0.67  CALCIUM 9.1 8.6*    Recent Labs  12/26/15 2210 12/27/15 0317  TROPONINI <0.03 <0.03     Studies/Results: Dg Chest 2 View  12/26/2015  CLINICAL DATA:  Chest pain. Shortness of breath, wheezing over last 2-3 days EXAM: CHEST  2 VIEW COMPARISON:  11/18/2015 FINDINGS: Cardiomegaly. Left lower lobe airspace opacity and small left effusion. Cannot exclude pneumonia. No confluent opacity on the right. No acute bony abnormality. IMPRESSION: Left lower lobe airspace opacity with small left effusion. Cannot exclude  pneumonia. Mild cardiomegaly. Electronically Signed   By: Rolm Baptise M.D.   On: 12/26/2015 12:05    Medications: I have reviewed the patient's current medications. Scheduled Meds: . aspirin  162 mg Oral q morning - 10a  . carvedilol  6.25 mg Oral BID WC  . enoxaparin (LOVENOX) injection  40 mg Subcutaneous Q24H  . folic acid  1 mg Oral q morning - 10a  . furosemide  60 mg Intravenous Q12H  . hydroxychloroquine  200 mg Oral q morning - 10a  . lisinopril  20 mg Oral Daily  . loratadine  10 mg Oral Daily  . methylPREDNISolone (SOLU-MEDROL) injection  60 mg Intravenous Q24H  . multivitamin with minerals  1 tablet Oral q morning - 10a  . potassium chloride  40 mEq Oral Daily  . sodium chloride  3 mL Intravenous Q12H   Continuous Infusions:  PRN Meds:.sodium chloride, acetaminophen, albuterol, guaiFENesin, guaiFENesin-dextromethorphan, HYDROcodone-acetaminophen, ibuprofen, ondansetron (ZOFRAN) IV, ondansetron, sodium chloride, traZODone  Assessment/Plan: 45 yo female who has an unfortunate history of SLE and asthma with restrictive lung disease. 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 She responded to steroids and diuretics. She has been followed by Dr. Rogers Blocker (Rheumatology at [facility]). She was taking metotrexate, plaquenil, prednisone 17.5 mg daily and Actemra. She now presents with several weeks of cough and dyspnea - she has been out of some of her medications due to cost/lack of insurance during this time. She  was seen in the [facility] ER and sent home a few weeks ago for a presumed viral URI, but her symptoms never improved. She is now noted to have had 10 lbs weight gain, significant DOE, marked hypertension and sharp chest pain. CXR shows left pleural effusion and cardiomegaly. Troponin initially is negative. BNP is 533. She has been given IV lasix with +effects.  Principal Problem:   Acute combined systolic and diastolic heart failure (HCC)-improving  Depending on  Echo may need cardiac MRI  --echo not yet done --negative troponin and CKMB --neg. 1150 at least- no wt change --on lasix 60 BID IV    Active Problems:   Shortness of breath-see above    SLE (systemic lupus erythematosus) (HCC) --sed rate 4 --CRP  <0.5 -- C3 C4 complement WNL -- CH50>60 --antiDNA double strand antibody in process    Cardiomyopathy, dilated (HCC)-last echo 2/16 EF 30-35%-G1DD   Hypokalemia-resolved   Chronic systolic heart failure (HCC)   Essential hypertension--controlled   Type 2 diabetes mellitus (HCC)--per IM      LOS: 1 day   Time spent with pt. :15 minutes. [facility]  Nurse Practitioner Certified Pager [phone] or after 5pm and on weekends call [phone] 12/27/2015, 8:15 AM

## 2015-12-27 NOTE — Progress Notes (Signed)
  Echocardiogram 2D Echocardiogram has been performed.  GREGORY, ANGELA 12/27/2015, 2:03 PM

## 2015-12-27 NOTE — Care Management Note (Signed)
Case Management Note  Patient Details  Name: Christine Mcdowell MRN: [id number] Date of Birth: May 05, 1971  Subjective/Objective:  45 y/o f admitted w/CHF. From home.                  Action/Plan:d/c plan home.   Expected Discharge Date:   (UNKNOWN)               Expected Discharge Plan:  Home/Self Care  In-House Referral:     Discharge planning Services  CM Consult  Post Acute Care Choice:    Choice offered to:     DME Arranged:    DME Agency:     HH Arranged:    HH Agency:     Status of Service:  In process, will continue to follow  Medicare Important Message Given:    Date Medicare IM Given:    Medicare IM give by:    Date Additional Medicare IM Given:    Additional Medicare Important Message give by:     If discussed at [place] of Stay Meetings, dates discussed:    Additional Comments:  Dessa Phi, RN 12/27/2015, 11:08 AM

## 2015-12-27 NOTE — Progress Notes (Signed)
Patient ID: Christine Mcdowell, female   DOB: 1971/02/19, 45 y.o.   MRN: [id number] [facility] PROGRESS NOTE  Christine Mcdowell [id number] DOB: 08-02-1971 DOA: 12/26/2015 PCP: Maximino Greenland, MD  Brief narrative:    45 year old female with past medical history of SLE, asthma, Myopericarditis in 2015 treated with high-dose steroids, last Myoview study with no ischemia, last cardiac MRI in March 2016 with ejection fraction of 46%, history of fibromyalgia who presented to [facility] long hospital with progressively worsening shortness of breath over past 3 weeks prior to this admission associated with cough productive of brownish sputum. Patient reports shortness of breath worse with exertion. She also reported associated lower extremity swelling. She reports history of noncompliance with medications for lupus because of issues with insurance. On admission, patient was hemodynamically stable. Chest x-ray showed left lower lobe airspace opacity with small left effusion and mild cardiomegaly.BNP was 533 on the admission. She has been seen by cardiology in consultation. She is on IV Lasix.   Assessment/Plan:    Principal Problem:   Acute combined systolic and diastolic heart failure (HCC) - Chest x-ray on the admission showed mild cardiomegaly. BNP was 533 on the admission. She had cardiac MRI in March 2016 which showed ejection fraction of 46% - 2-D echo on this admission is pending - Appreciate cardiology following - Continue Lasix 60 mg IV every 12 hours - Continue daily aspirin - Continue Coreg 6.25 mg twice daily, lisinopril 20 mg daily - Continue strict intake and output and daily weight  Active Problems:     SLE (systemic lupus erythematosus) (HCC) - Continue plaquenil and solumedrol 60 mg IV Q 24 hours    Hypokalemia - Due to Lasix. Supplemented    Essential hypertension - Continue Coreg, Lasix and lisinopril    Possible lobar pneumonia,unspecified organism - We'll start  empiric azithromycin  DVT Prophylaxis  - Lovenox subcutaneous ordered   Code Status: Full.  Family Communication:  plan of care discussed with the patient Disposition Plan: Home likely by 12/30/2015  IV access:  Peripheral IV  Procedures and diagnostic studies:    Dg Chest 2 View 12/26/2015   Left lower lobe airspace opacity with small left effusion. Cannot exclude pneumonia. Mild cardiomegaly. Electronically Signed   By: Rolm Baptise M.D.   On: 12/26/2015 12:05   Medical Consultants:  Cardiology  Other Consultants:  None  IAnti-Infectives:   Azithromycin 12/27/2015 -->    Leisa Lenz, MD  [facility] Pager [phone]  Time spent in minutes: 25 minutes  If 7PM-7AM, please contact night-coverage [email or web address] Password [email or web address] 12/27/2015, 1:10 PM   LOS: 1 day    HPI/Subjective: No acute overnight events. Patient reports headaches and coughing up dark sputum.  Objective: Filed Vitals:   12/26/15 1700 12/26/15 2130 12/27/15 0011 12/27/15 0603  BP: 130/87 135/88 135/88 119/73  Pulse: 90 89 81 76  Temp: 98.3 F (36.8 C) 98 F (36.7 C) 97.9 F (36.6 C) 98.2 F (36.8 C)  TempSrc: Oral Oral Oral Oral  Resp: 22 19 18 18   Height:      Weight:    171 lb 1.2 oz (77.6 kg)  SpO2: 97% 100% 99% 100%    Intake/Output Summary (Last 24 hours) at 12/27/15 1310 Last data filed at 12/27/15 0745  Gross per 24 hour  Intake      0 ml  Output   1800 ml  Net  -1800 ml    Exam:   General:  Pt is alert, follows commands appropriately, not in  acute distress  Cardiovascular: Regular rate and rhythm, S1/S2 (+)  Respiratory: Clear to auscultation bilaterally, no wheezing, no crackles, no rhonchi  Abdomen: Soft, non tender, non distended, bowel sounds present  Extremities: No edema, pulses DP and PT palpable bilaterally  Neuro: Grossly nonfocal  Data Reviewed: Basic Metabolic Panel:  Recent Labs Lab 12/26/15 1132 12/27/15 0330  NA 144 140  K 3.1* 4.0  CL 111  106  CO2 21* 24  GLUCOSE 85 133*  BUN 11 11  CREATININE 0.77 0.67  CALCIUM 9.1 8.6*   Liver Function Tests: No results for input(s): AST, ALT, ALKPHOS, BILITOT, PROT, ALBUMIN in the last 168 hours. No results for input(s): LIPASE, AMYLASE in the last 168 hours. No results for input(s): AMMONIA in the last 168 hours. CBC:  Recent Labs Lab 12/26/15 1132  WBC 7.5  HGB 11.0*  HCT 33.6*  MCV 85.1  PLT 200   Cardiac Enzymes:  Recent Labs Lab 12/26/15 1608 12/26/15 2210 12/27/15 0317  CKTOTAL 76  --   --   CKMB 4.8  --   --   TROPONINI <0.03 <0.03 <0.03   BNP: Invalid input(s): POCBNP CBG: No results for input(s): GLUCAP in the last 168 hours.  No results found for this or any previous visit (from the past 240 hour(s)).   Scheduled Meds: . aspirin  162 mg Oral q morning - 10a  . carvedilol  6.25 mg Oral BID WC  . enoxaparin (LOVENOX) injection  40 mg Subcutaneous Q24H  . folic acid  1 mg Oral q morning - 10a  . furosemide  60 mg Intravenous Q12H  . hydroxychloroquine  200 mg Oral q morning - 10a  . lisinopril  20 mg Oral Daily  . loratadine  10 mg Oral Daily  . methylPREDNISolone (SOLU-MEDROL) injection  60 mg Intravenous Q24H  . multivitamin with minerals  1 tablet Oral q morning - 10a  . potassium chloride  40 mEq Oral Daily  . sodium chloride  3 mL Intravenous Q12H   Continuous Infusions:

## 2015-12-28 ENCOUNTER — Inpatient Hospital Stay (HOSPITAL_COMMUNITY): Payer: 59

## 2015-12-28 ENCOUNTER — Telehealth: Payer: Self-pay | Admitting: Cardiovascular Disease

## 2015-12-28 DIAGNOSIS — I5023 Acute on chronic systolic (congestive) heart failure: Secondary | ICD-10-CM

## 2015-12-28 DIAGNOSIS — K219 Gastro-esophageal reflux disease without esophagitis: Secondary | ICD-10-CM | POA: Diagnosis present

## 2015-12-28 LAB — BLOOD GAS, ARTERIAL
Acid-Base Excess: 0.8 mmol/L (ref 0.0–2.0)
Bicarbonate: 22 mEq/L (ref 20.0–24.0)
Drawn by: 103701
O2 Content: 2 L/min
O2 Saturation: 98.8 %
Patient temperature: 98.6
TCO2: 19.4 mmol/L (ref 0–100)
pCO2 arterial: 25.6 mmHg — ABNORMAL LOW (ref 35.0–45.0)
pH, Arterial: 7.542 — ABNORMAL HIGH (ref 7.350–7.450)
pO2, Arterial: 119 mmHg — ABNORMAL HIGH (ref 80.0–100.0)

## 2015-12-28 LAB — GLUCOSE, CAPILLARY
Glucose-Capillary: 94 mg/dL (ref 65–99)
Glucose-Capillary: 121 mg/dL — ABNORMAL HIGH (ref 65–99)

## 2015-12-28 LAB — BASIC METABOLIC PANEL
Anion gap: 10 (ref 5–15)
BUN: 15 mg/dL (ref 6–20)
CO2: 27 mmol/L (ref 22–32)
Calcium: 8.1 mg/dL — ABNORMAL LOW (ref 8.9–10.3)
Chloride: 105 mmol/L (ref 101–111)
Creatinine, Ser: 0.78 mg/dL (ref 0.44–1.00)
GFR calc Af Amer: >60 mL/min (ref >60)
GFR calc non Af Amer: >60 mL/min (ref >60)
Glucose, Bld: 91 mg/dL (ref 65–99)
Potassium: 3 mmol/L — ABNORMAL LOW (ref 3.5–5.1)
Sodium: 142 mmol/L (ref 135–145)

## 2015-12-28 LAB — MAGNESIUM: Magnesium: 1.6 mg/dL — ABNORMAL LOW (ref 1.7–2.4)

## 2015-12-28 LAB — TROPONIN I: Troponin I: <0.03 ng/mL (ref <0.031)

## 2015-12-28 LAB — BRAIN NATRIURETIC PEPTIDE: B Natriuretic Peptide: 150.1 pg/mL — ABNORMAL HIGH (ref 0.0–100.0)

## 2015-12-28 MED ORDER — MAGNESIUM SULFATE 2 GM/50ML IV SOLN
2.0000 g | Freq: Once | INTRAVENOUS | Status: AC
  Administered 2015-12-28: 2 g via INTRAVENOUS
  Filled 2015-12-28: qty 50

## 2015-12-28 MED ORDER — POTASSIUM CHLORIDE CRYS ER 20 MEQ PO TBCR
40.0000 meq | EXTENDED_RELEASE_TABLET | Freq: Once | ORAL | Status: AC
  Administered 2015-12-28: 40 meq via ORAL
  Filled 2015-12-28: qty 2

## 2015-12-28 MED ORDER — ALPRAZOLAM 1 MG PO TABS
1.0000 mg | ORAL_TABLET | Freq: Once | ORAL | Status: AC
  Administered 2015-12-28: 1 mg via ORAL
  Filled 2015-12-28: qty 1

## 2015-12-28 MED ORDER — LEVALBUTEROL HCL 0.63 MG/3ML IN NEBU
0.6300 mg | INHALATION_SOLUTION | Freq: Four times a day (QID) | RESPIRATORY_TRACT | Status: DC | PRN
  Administered 2015-12-29: 0.63 mg via RESPIRATORY_TRACT
  Filled 2015-12-28: qty 3

## 2015-12-28 MED ORDER — PANTOPRAZOLE SODIUM 20 MG PO TBEC
20.0000 mg | DELAYED_RELEASE_TABLET | Freq: Every day | ORAL | Status: DC
  Administered 2015-12-29 – 2015-12-30 (×2): 20 mg via ORAL
  Filled 2015-12-28 (×3): qty 1

## 2015-12-28 NOTE — Telephone Encounter (Signed)
Will forward to Dr. Nishan so he is aware. 

## 2015-12-28 NOTE — Telephone Encounter (Signed)
New message      FYI Calling to let Dr Johnsie Cancel know that she is in [facility].  She said the hosp doctor said it was acid reflux but pt said it was her chf/sob.  Pt want Dr Johnsie Cancel to come see her

## 2015-12-28 NOTE — Progress Notes (Signed)
Patient: Christine Mcdowell / Admit Date: 12/26/2015 / Date of Encounter: 12/28/2015, 7:33 AM   Subjective: UOP has slowed down. When ambulating in her room she is less SOB. Still gets orthopnea but this is chronic.   Objective: Telemetry: NSR Physical Exam: Blood pressure 106/68, pulse 76, temperature 98 F (36.7 C), temperature source Oral, resp. rate 16, height 5\' 7"  (1.702 m), weight 170 lb 13.7 oz (77.5 kg), SpO2 100 %. General: Well developed, well nourished AAF in no acute distress. Head: Normocephalic, atraumatic, sclera non-icteric, no xanthomas, nares are without discharge. Neck: Negative for carotid bruits. JVP not elevated. Lungs: Clear bilaterally to auscultation without wheezes, rales, or rhonchi. Breathing is unlabored. Heart: RRR S1 S2 without murmurs, rubs, or gallops.  Abdomen: Soft, non-tender, non-distended with normoactive bowel sounds. No rebound/guarding. Extremities: No clubbing or cyanosis. No edema. Distal pedal pulses are 2+ and equal bilaterally. Neuro: Alert and oriented X 3. Moves all extremities spontaneously. Psych:  Responds to questions appropriately with a normal affect.   Intake/Output Summary (Last 24 hours) at 12/28/15 0733 Last data filed at 12/27/15 2346  Gross per 24 hour  Intake    490 ml  Output   2000 ml  Net  -1510 ml    Inpatient Medications:  . aspirin  162 mg Oral q morning - 10a  . azithromycin  500 mg Intravenous Q24H  . carvedilol  6.25 mg Oral BID WC  . enoxaparin (LOVENOX) injection  40 mg Subcutaneous Q24H  . folic acid  1 mg Oral q morning - 10a  . furosemide  40 mg Oral Daily  . hydroxychloroquine  200 mg Oral q morning - 10a  . lisinopril  20 mg Oral Daily  . loratadine  10 mg Oral Daily  . multivitamin with minerals  1 tablet Oral q morning - 10a  . potassium chloride  40 mEq Oral Daily  . predniSONE  20 mg Oral Q breakfast  . sodium chloride  3 mL Intravenous Q12H   Infusions:    Labs:  Recent Labs   12/27/15 0330 12/28/15 0505  NA 140 142  K 4.0 3.0*  CL 106 105  CO2 24 27  GLUCOSE 133* 91  BUN 11 15  CREATININE 0.67 0.78  CALCIUM 8.6* 8.1*    Recent Labs  12/26/15 1132  WBC 7.5  HGB 11.0*  HCT 33.6*  MCV 85.1  PLT 200    Recent Labs  12/26/15 1608 12/26/15 2210 12/27/15 0317  CKTOTAL 76  --   --   CKMB 4.8  --   --   TROPONINI <0.03 <0.03 <0.03   Radiology/Studies:  Dg Chest 2 View  12/26/2015  CLINICAL DATA:  Chest pain. Shortness of breath, wheezing over last 2-3 days EXAM: CHEST  2 VIEW COMPARISON:  11/18/2015 FINDINGS: Cardiomegaly. Left lower lobe airspace opacity and small left effusion. Cannot exclude pneumonia. No confluent opacity on the right. No acute bony abnormality. IMPRESSION: Left lower lobe airspace opacity with small left effusion. Cannot exclude pneumonia. Mild cardiomegaly. Electronically Signed   By: Rolm Baptise M.D.   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 She had run out of her medications recently due to cost/lack of insurance. 2D Echo 12/27/15: mildly  dilated LV, mild LVH, EF 35-40%, diffuse HK, normal diastolic function parameters, mild MR, trivial pericardial effusion. Troponins negative.  1. Acute on chronic systolic CHF - -[id number] out thus far. Weight hasn't changed much but symptomatically she is improving. Agree with switch to oral Lasix today. Will discuss addition of spironolactone with MD given LV dysfunction, hypokalemia.  2. Essential HTN - now controlled. Follow.  3. SLE - per notes, serologic studies do not suggest active lupus. Now back on oral steroids.  4. Noncompliance with meds due to insurance/cost - initial reports say the patient  ran out of her meds. She says she was taking cardiac meds but not lupus meds. Care management following.  5. Hypokalemia - K 3.0. Have written an order for nurse to give 10am KCl now. Will repeat dose at 11:30am. Will also check Mg. See above re: ? Spironolactone.   Signed, Melina Copa PA-C Pager: [phone]

## 2015-12-28 NOTE — Progress Notes (Addendum)
Patient ID: Christine Mcdowell, female   DOB: Apr 20, 1971, 45 y.o.   MRN: [id number] [facility] PROGRESS NOTE  Christine Mcdowell [id number] DOB: 1971/07/15 DOA: 12/26/2015 PCP: Maximino Greenland, MD  Brief narrative:    45 year old female with past medical history of SLE, asthma, Myopericarditis in 2015 treated with high-dose steroids, last Myoview study with no ischemia, last cardiac MRI in March 2016 with ejection fraction of 46%, history of fibromyalgia who presented to [facility] hospital with progressively worsening shortness of breath over past 3 weeks prior to this admission associated with cough productive of brownish sputum. Patient reports shortness of breath worse with exertion. She also reported associated lower extremity swelling. She reports history of noncompliance with medications for lupus because of issues with insurance.  On admission, patient was hemodynamically stable. Chest x-ray showed left lower lobe airspace opacity with small left effusion and mild cardiomegaly.BNP was 533 on the admission. She has been seen by cardiology in consultation. She was started on IV Lasix.  Barrier to discharge: This morning patient more respiratory distress, shallow breaths, rapid response called. Orders placed for chest x-ray this morning, 1 set cardiac enzymes, 12-lead EKG and ABG. One dose of Xanax is given as patient seems very anxious. Cardiology switched Lasix from IV to by mouth regimen.  Assessment/Plan:    Principal Problem:   Acute combined systolic and diastolic heart failure ([place]) / acute respiratory failure with hypoxia - BNP on this admission is 533. Patient has had a cardiac MRI in March 2016 which showed ejection fraction of 45%. - 2-D echo on this admission shows ejection fraction 35-40%. Left ventricular diastolic function parameters were normal. There is a diffuse hypokinesis and trivial pericardial effusion in the posterior to the heart but not consistent with  tamponade physiology. - Cardiology is following - Patient was on Lasix 60 mg IV every 12 hours and this morning was switched to by mouth Lasix per cardiology recommendations - Patient has lost 1 pound in past 24 hours. - Continue daily weight and strict intake and output - Continue daily aspirin - Continue Coreg 6.25 mg twice daily, lisinopril 20 mg daily - Obtain chest x-ray this morning, ABG   Active Problems:     SLE (systemic lupus erythematosus) (HCC) - Continue plaquenil. She was on Solu-Medrol 60 mg IV Q 24 hours but this was changed today to prednisone 20 mg daily    Hypokalemia / hypomagnesemia - Secondary to Lasix. - Supplemented    Essential hypertension - Continue Coreg, Lasix and lisinopril    Possible lobar pneumonia,unspecified organism - Continue empiric azithromycin, today is day 2 of azithromycin   DVT Prophylaxis  - Lovenox subQ   Code Status: Full.  Family Communication:  plan of care discussed with the patient Disposition Plan: Home likely by 12/30/2015  IV access:  Peripheral IV  Procedures and diagnostic studies:    Dg Chest 2 View 12/26/2015   Left lower lobe airspace opacity with small left effusion. Cannot exclude pneumonia. Mild cardiomegaly. Electronically Signed   By: Rolm Baptise M.D.   On: 12/26/2015 12:05   Medical Consultants:  Cardiology  Other Consultants:  None  IAnti-Infectives:   Azithromycin 12/27/2015 -->    Leisa Lenz, MD  [facility] Pager [phone]  Time spent in minutes: 25 minutes  If 7PM-7AM, please contact night-coverage [email or web address] Password [email or web address] 12/28/2015, 12:44 PM   LOS: 2 days    HPI/Subjective: No acute overnight events. Patient in more respiratory distress this morning, seems anxious, diminished breath sounds.  Objective: Filed Vitals:   12/27/15 2023 12/28/15 0534 12/28/15 0844 12/28/15 0851  BP: 111/63 106/68 131/81   Pulse: 79 76 110 90  Temp: 98.3 F (36.8 C) 98 F (36.7 C)     TempSrc: Oral Oral    Resp: 16 16    Height:      Weight:  170 lb 13.7 oz (77.5 kg)    SpO2: 100% 100% 87% 92%    Intake/Output Summary (Last 24 hours) at 12/28/15 1244 Last data filed at 12/28/15 0805  Gross per 24 hour  Intake    490 ml  Output   1750 ml  Net  -1260 ml    Exam:   General:  Pt in more distress this morning, short of breath  Cardiovascular: Tachycardic, appreciate S1-S2  Respiratory: Diminished breath sounds, no rhonchi  Abdomen: Nontender abdomen, appreciate bowel sounds  Extremities: No leg swelling, pulses palpable  Neuro: No focal deficits  Data Reviewed: Basic Metabolic Panel:  Recent Labs Lab 12/26/15 1132 12/27/15 0330 12/28/15 0505  NA 144 140 142  K 3.1* 4.0 3.0*  CL 111 106 105  CO2 21* 24 27  GLUCOSE 85 133* 91  BUN 11 11 15   CREATININE 0.77 0.67 0.78  CALCIUM 9.1 8.6* 8.1*  MG  --   --  1.6*   Liver Function Tests: No results for input(s): AST, ALT, ALKPHOS, BILITOT, PROT, ALBUMIN in the last 168 hours. No results for input(s): LIPASE, AMYLASE in the last 168 hours. No results for input(s): AMMONIA in the last 168 hours. CBC:  Recent Labs Lab 12/26/15 1132  WBC 7.5  HGB 11.0*  HCT 33.6*  MCV 85.1  PLT 200   Cardiac Enzymes:  Recent Labs Lab 12/26/15 1608 12/26/15 2210 12/27/15 0317 12/28/15 0940  CKTOTAL 76  --   --   --   CKMB 4.8  --   --   --   TROPONINI <0.03 <0.03 <0.03 <0.03   BNP: Invalid input(s): POCBNP CBG:  Recent Labs Lab 12/28/15 0800 12/28/15 1146  GLUCAP 94 121*    No results found for this or any previous visit (from the past 240 hour(s)).   Scheduled Meds: . aspirin  162 mg Oral q morning - 10a  . azithromycin  500 mg Intravenous Q24H  . carvedilol  6.25 mg Oral BID WC  . enoxaparin (LOVENOX) injection  40 mg Subcutaneous Q24H  . folic acid  1 mg Oral q morning - 10a  . furosemide  40 mg Oral Daily  . hydroxychloroquine  200 mg Oral q morning - 10a  . lisinopril  20 mg Oral  Daily  . loratadine  10 mg Oral Daily  . multivitamin with minerals  1 tablet Oral q morning - 10a  . potassium chloride  40 mEq Oral Daily  . predniSONE  20 mg Oral Q breakfast  . sodium chloride  3 mL Intravenous Q12H   Continuous Infusions:

## 2015-12-28 NOTE — Telephone Encounter (Signed)
Patient stated that she was told she has Acid Reflux, but she does not agree. Patient stated she feels that something else is going on and she knows her body. Encouraged patient to let Dr. Debara Pickett and her hospitalist know of her concerns. Patient wanted this statement to be documented in her chart. Patient is due for a follow-up visit with Dr. Johnsie Cancel, so made patient a follow-up visit at her request.

## 2015-12-28 NOTE — Progress Notes (Signed)
0830am Pt called RN stating "I cant breath".  Upon entering room pt in sitting up on side of bed with rapid, shallow breaths and constant cough. VSS obtained, PRN breathing treatment started, O2 applied, MD paged and up to bedside. Orders received. 08:44am pt still with rapid breathing, cough is decreasing, pt states breathing is getting better, lungs clear and diminished in lower lobes, Resp therapy at bedside getting ABG. Will continue to monitor.  Long, Marissa A

## 2015-12-28 NOTE — Telephone Encounter (Signed)
She is being seen by our team on rounds Hilty and Sharrell Ku

## 2015-12-29 ENCOUNTER — Inpatient Hospital Stay (HOSPITAL_COMMUNITY): Payer: 59

## 2015-12-29 ENCOUNTER — Telehealth: Payer: Self-pay | Admitting: Cardiovascular Disease

## 2015-12-29 DIAGNOSIS — F411 Generalized anxiety disorder: Secondary | ICD-10-CM

## 2015-12-29 DIAGNOSIS — K219 Gastro-esophageal reflux disease without esophagitis: Secondary | ICD-10-CM

## 2015-12-29 LAB — BASIC METABOLIC PANEL
Anion gap: 10 (ref 5–15)
BUN: 14 mg/dL (ref 6–20)
CO2: 26 mmol/L (ref 22–32)
Calcium: 8.5 mg/dL — ABNORMAL LOW (ref 8.9–10.3)
Chloride: 105 mmol/L (ref 101–111)
Creatinine, Ser: 0.74 mg/dL (ref 0.44–1.00)
GFR calc Af Amer: >60 mL/min (ref >60)
GFR calc non Af Amer: >60 mL/min (ref >60)
Glucose, Bld: 98 mg/dL (ref 65–99)
Potassium: 3.7 mmol/L (ref 3.5–5.1)
Sodium: 141 mmol/L (ref 135–145)

## 2015-12-29 LAB — MAGNESIUM: Magnesium: 2.2 mg/dL (ref 1.7–2.4)

## 2015-12-29 MED ORDER — AZITHROMYCIN 500 MG PO TABS
500.0000 mg | ORAL_TABLET | Freq: Every day | ORAL | Status: DC
  Administered 2015-12-30: 500 mg via ORAL
  Filled 2015-12-29: qty 1

## 2015-12-29 MED ORDER — ALPRAZOLAM 1 MG PO TABS
1.0000 mg | ORAL_TABLET | Freq: Two times a day (BID) | ORAL | Status: DC
  Administered 2015-12-29 – 2015-12-30 (×2): 1 mg via ORAL
  Filled 2015-12-29 (×3): qty 1

## 2015-12-29 NOTE — Progress Notes (Signed)
PHARMACIST - PHYSICIAN COMMUNICATION DR:   Charlies Silvers CONCERNING: Antibiotic IV to Oral Route Change Policy  RECOMMENDATION: This patient is receiving Zithromax by the intravenous route.  Based on criteria approved by the Pharmacy and Therapeutics Committee, the antibiotic(s) is/are being converted to the equivalent oral dose form(s).   DESCRIPTION: These criteria include:  Patient being treated for a respiratory tract infection, urinary tract infection, cellulitis or clostridium difficile associated diarrhea if on metronidazole  The patient is not neutropenic and does not exhibit a GI malabsorption state  The patient is eating (either orally or via tube) and/or has been taking other orally administered medications for a least 24 hours  The patient is improving clinically and has a Tmax < 100.5  If you have questions about this conversion, please contact the Pharmacy Department  []   [phone] )  [facility] []   [phone] )  [facility] []   [phone] )  [facility] []   [phone] )  [facility] [x]   [phone] )  [place], PharmD, BCPS Pager: [phone] 12/29/2015@2 :41 PM

## 2015-12-29 NOTE — Progress Notes (Signed)
Patient: Christine Mcdowell / Admit Date: 12/26/2015 / Date of Encounter: 12/29/2015, 7:43 AM   Subjective: Feeling much better this AM. She feels ready to go home. No further choking episodes.   Objective: Telemetry: NSR Physical Exam: Blood pressure 112/65, pulse 79, temperature 98.4 F (36.9 C), temperature source Oral, resp. rate 18, height 5\' 7"  (1.702 m), weight 172 lb (78.019 kg), SpO2 100 %. General: Well developed, well nourished AAF in no acute distress. Head: Normocephalic, atraumatic, sclera non-icteric, no xanthomas, nares are without discharge. Neck: Negative for carotid bruits. JVP not elevated. Lungs: Clear bilaterally to auscultation without wheezes, rales, or rhonchi. Breathing is unlabored. Heart: RRR S1 S2 without murmurs, rubs, or gallops.  Abdomen: Soft, non-tender, non-distended with normoactive bowel sounds. No rebound/guarding. Extremities: No clubbing or cyanosis. No edema. Distal pedal pulses are 2+ and equal bilaterally. Neuro: Alert and oriented X 3. Moves all extremities spontaneously. Psych: Responds to questions appropriately with a normal affect.   Intake/Output Summary (Last 24 hours) at 12/29/15 0743 Last data filed at 12/29/15 0556  Gross per 24 hour  Intake    780 ml  Output    900 ml  Net   -120 ml    Inpatient Medications:  . aspirin  162 mg Oral q morning - 10a  . azithromycin  500 mg Intravenous Q24H  . carvedilol  6.25 mg Oral BID WC  . enoxaparin (LOVENOX) injection  40 mg Subcutaneous Q24H  . folic acid  1 mg Oral q morning - 10a  . furosemide  40 mg Oral Daily  . hydroxychloroquine  200 mg Oral q morning - 10a  . lisinopril  20 mg Oral Daily  . loratadine  10 mg Oral Daily  . multivitamin with minerals  1 tablet Oral q morning - 10a  . pantoprazole  20 mg Oral Daily  . potassium chloride  40 mEq Oral Daily  . predniSONE  20 mg Oral Q breakfast  . sodium chloride  3 mL Intravenous Q12H   Infusions:    Labs:  Recent Labs  12/28/15 0505 12/29/15 0508  NA 142 141  K 3.0* 3.7  CL 105 105  CO2 27 26  GLUCOSE 91 98  BUN 15 14  CREATININE 0.78 0.74  CALCIUM 8.1* 8.5*  MG 1.6* 2.2   No results for input(s): AST, ALT, ALKPHOS, BILITOT, PROT, ALBUMIN in the last 72 hours.  Recent Labs  12/26/15 1132  WBC 7.5  HGB 11.0*  HCT 33.6*  MCV 85.1  PLT 200    Recent Labs  12/26/15 1608 12/26/15 2210 12/27/15 0317 12/28/15 0940  CKTOTAL 76  --   --   --   CKMB 4.8  --   --   --   TROPONINI <0.03 <0.03 <0.03 <0.03   Invalid input(s): POCBNP No results for input(s): HGBA1C in the last 72 hours.   Radiology/Studies:  Dg Chest 2 View  12/26/2015  CLINICAL DATA:  Chest pain. Shortness of breath, wheezing over last 2-3 days EXAM: CHEST  2 VIEW COMPARISON:  11/18/2015 FINDINGS: Cardiomegaly. Left lower lobe airspace opacity and small left effusion. Cannot exclude pneumonia. No confluent opacity on the right. No acute bony abnormality. IMPRESSION: Left lower lobe airspace opacity with small left effusion. Cannot exclude pneumonia. Mild cardiomegaly. Electronically Signed   By: Rolm Baptise M.D.   On: 12/26/2015 12:05   Dg Chest Port 1 View  12/28/2015  CLINICAL DATA:  Shortness of Breath EXAM: PORTABLE CHEST - 1 VIEW COMPARISON:  12/26/2015 FINDINGS: Cardiac shadow is mildly enlarged but stable. Persistent left basilar infiltrate with associated small effusion is seen. No new focal abnormality is noted. No bony abnormality is seen. IMPRESSION: Stable left basilar changes. Electronically Signed   By: Inez Catalina M.D.   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 She had run out of her medications recently due to cost/lack of  insurance. 2D Echo 12/27/15: mildly dilated LV, mild LVH, EF 35-40%, diffuse HK, normal diastolic function parameters, mild MR, trivial pericardial effusion. Troponins negative.  1. Acute on chronic systolic CHF - now back on oral Lasix. No sig change in weight this admission but she appears euvolemic on exam. Continue current regimen. Had cough/choking feeling yesterday AM which Dr. Debara Pickett is treating as GERD. She is also on empiric rx for possible lobar PNA.  2. Essential HTN - now controlled. Follow.  3. SLE - per notes, serologic studies do not suggest active lupus. Now back on oral steroids.  4. Noncompliance with meds due to insurance/cost - initial reports say the patient ran out of her meds. She says she was taking cardiac meds but not lupus meds. Care management following.  5. Hypokalemia - continue KCl.  Office lines were busy multiple times when I tried calling. I have sent a message to our [facility] office's scheduler requesting a TOC follow-up appointment, and our office will call the patient with this information.  Signed, Melina Copa PA-C Pager: [phone]

## 2015-12-29 NOTE — Progress Notes (Addendum)
Patient ID: Christine Mcdowell, female   DOB: [birth year], 45 y.o.   MRN: [id number] [facility] PROGRESS NOTE  Marquesha Beougher [id number] DOB: 09/27/1971 DOA: 12/26/2015 PCP: Maximino Greenland, MD  Brief narrative:    45 year old female with past medical history of SLE, asthma, Myopericarditis in 2015 treated with high-dose steroids, last Myoview study with no ischemia, last cardiac MRI in March 2016 with ejection fraction of 46%, history of fibromyalgia who presented to [facility] hospital with progressively worsening shortness of breath over past 3 weeks prior to this admission associated with cough productive of brownish sputum. Patient reports shortness of breath worse with exertion. She also reported associated lower extremity swelling. She reports history of noncompliance with medications for lupus because of issues with insurance.  On admission, patient was hemodynamically stable. Chest x-ray showed left lower lobe airspace opacity with small left effusion and mild cardiomegaly.BNP was 533 on the admission. She has been seen by cardiology in consultation. She was started on IV Lasix.  Barrier to discharge: Still intermittently short of breath so order placed for CT chest for further evaluation.   Assessment/Plan:    Principal Problem:   Acute combined systolic and diastolic heart failure ([place]) / acute respiratory failure with hypoxia - BNP on this admission is 533. Patient has had a cardiac MRI in March 2016 which showed ejection fraction of 45%. - 2-D echo on this admission shows ejection fraction 35-40%. Left ventricular diastolic function parameters were normal. There was a diffuse hypokinesis and trivial pericardial effusion in the posterior to the heart but not consistent with tamponade - Patient was on Lasix 60 mg IV every 12 hours and then switched to PO regimen 12/28/2015 - Continue daily aspirin - Continue Coreg 6.25 mg twice daily, lisinopril 20 mg daily - Obtain CT  chest today for further evaluation of intermittent shortness of breath    Active Problems:     Anxiety - Started low dose xanax    SLE (systemic lupus erythematosus) (HCC) - Continue plaquenil. She was on Solu-Medrol 60 mg IV Q 24 hours but this was changed to prednisone 20 mg daily 12/28/2015.    Hypokalemia / hypomagnesemia - Secondary to Lasix. - Now WNL    Essential hypertension - Continue Coreg, Lasix and lisinopril    Possible lobar pneumonia,unspecified organism - Continue empiric azithromycin, today is day #3  DVT Prophylaxis  - Lovenox subQ in hospital    Code Status: Full.  Family Communication:  plan of care discussed with the patient and her daughter at the bedside Disposition Plan: Home likely by 12/31/2015  IV access:  Peripheral IV  Procedures and diagnostic studies:    Dg Chest 2 View 12/26/2015   Left lower lobe airspace opacity with small left effusion. Cannot exclude pneumonia. Mild cardiomegaly. Electronically Signed   By: Rolm Baptise M.D.   On: 12/26/2015 12:05   Medical Consultants:  Cardiology  Other Consultants:  None  IAnti-Infectives:   Azithromycin 12/27/2015 -->    Leisa Lenz, MD  [facility] Pager [phone]  Time spent in minutes: 25 minutes  If 7PM-7AM, please contact night-coverage [email or web address] Password [facility] 12/29/2015, 7:44 PM   LOS: 3 days    HPI/Subjective: No acute overnight events. Patient again had brief episode of intractable shortness of breath which spontaneously resolved.  Objective: Filed Vitals:   12/28/15 2031 12/29/15 0556 12/29/15 0833 12/29/15 1330  BP: 108/73 112/65  111/73  Pulse: 76 79  81  Temp: 97.9 F (36.6 C) 98.4 F (36.9 C)  98.6 F (37 C)  TempSrc: Oral Oral  Oral  Resp: 16 18  16   Height:      Weight:  172 lb (78.019 kg)    SpO2: 99% 100% 99% 99%    Intake/Output Summary (Last 24 hours) at 12/29/15 1944 Last data filed at 12/29/15 1700  Gross per 24 hour  Intake    970 ml   Output   1800 ml  Net   -830 ml    Exam:   General:  Pt alert, no distress  Cardiovascular: rate controlled, appreciate S1, S2  Respiratory: no wheezing, no rhonchi   Abdomen: (+) BS, non tender   Extremities: No edema, palpable pulses   Neuro: Nonfocal   Data Reviewed: Basic Metabolic Panel:  Recent Labs Lab 12/26/15 1132 12/27/15 0330 12/28/15 0505 12/29/15 0508  NA 144 140 142 141  K 3.1* 4.0 3.0* 3.7  CL 111 106 105 105  CO2 21* 24 27 26   GLUCOSE 85 133* 91 98  BUN 11 11 15 14   CREATININE 0.77 0.67 0.78 0.74  CALCIUM 9.1 8.6* 8.1* 8.5*  MG  --   --  1.6* 2.2   Liver Function Tests: No results for input(s): AST, ALT, ALKPHOS, BILITOT, PROT, ALBUMIN in the last 168 hours. No results for input(s): LIPASE, AMYLASE in the last 168 hours. No results for input(s): AMMONIA in the last 168 hours. CBC:  Recent Labs Lab 12/26/15 1132  WBC 7.5  HGB 11.0*  HCT 33.6*  MCV 85.1  PLT 200   Cardiac Enzymes:  Recent Labs Lab 12/26/15 1608 12/26/15 2210 12/27/15 0317 12/28/15 0940  CKTOTAL 76  --   --   --   CKMB 4.8  --   --   --   TROPONINI <0.03 <0.03 <0.03 <0.03   BNP: Invalid input(s): POCBNP CBG:  Recent Labs Lab 12/28/15 0800 12/28/15 1146  GLUCAP 94 121*    No results found for this or any previous visit (from the past 240 hour(s)).   Scheduled Meds: . ALPRAZolam  1 mg Oral BID  . aspirin  162 mg Oral q morning - 10a  . [START ON 12/30/2015] azithromycin  500 mg Oral Daily  . carvedilol  6.25 mg Oral BID WC  . enoxaparin (LOVENOX) injection  40 mg Subcutaneous Q24H  . folic acid  1 mg Oral q morning - 10a  . furosemide  40 mg Oral Daily  . hydroxychloroquine  200 mg Oral q morning - 10a  . lisinopril  20 mg Oral Daily  . loratadine  10 mg Oral Daily  . multivitamin with minerals  1 tablet Oral q morning - 10a  . pantoprazole  20 mg Oral Daily  . potassium chloride  40 mEq Oral Daily  . predniSONE  20 mg Oral Q breakfast  . sodium  chloride  3 mL Intravenous Q12H   Continuous Infusions:

## 2015-12-29 NOTE — Telephone Encounter (Signed)
TCM per [facility]  01/04/16 @ 12:10 w/ scott

## 2015-12-30 LAB — BASIC METABOLIC PANEL
Anion gap: 10 (ref 5–15)
BUN: 12 mg/dL (ref 6–20)
CO2: 26 mmol/L (ref 22–32)
Calcium: 8.7 mg/dL — ABNORMAL LOW (ref 8.9–10.3)
Chloride: 105 mmol/L (ref 101–111)
Creatinine, Ser: 0.71 mg/dL (ref 0.44–1.00)
GFR calc Af Amer: >60 mL/min (ref >60)
GFR calc non Af Amer: >60 mL/min (ref >60)
Glucose, Bld: 91 mg/dL (ref 65–99)
Potassium: 3.6 mmol/L (ref 3.5–5.1)
Sodium: 141 mmol/L (ref 135–145)

## 2015-12-30 MED ORDER — PREDNISONE 20 MG PO TABS: 20.0000 mg | ORAL_TABLET | Freq: Every day | ORAL | Status: DC

## 2015-12-30 MED ORDER — LORATADINE 10 MG PO TABS: 10.0000 mg | ORAL_TABLET | Freq: Every day | ORAL | Status: DC

## 2015-12-30 MED ORDER — LISINOPRIL 20 MG PO TABS: 20.0000 mg | ORAL_TABLET | Freq: Every day | ORAL | Status: DC

## 2015-12-30 MED ORDER — ALBUTEROL SULFATE (2.5 MG/3ML) 0.083% IN NEBU: 2.5000 mg | INHALATION_SOLUTION | Freq: Four times a day (QID) | RESPIRATORY_TRACT | Status: DC | PRN

## 2015-12-30 MED ORDER — INSULIN DETEMIR 100 UNIT/ML ~~LOC~~ SOLN: 15.0000 [IU] | Freq: Two times a day (BID) | SUBCUTANEOUS | Status: DC | PRN

## 2015-12-30 MED ORDER — ONDANSETRON HCL 4 MG PO TABS: 4.0000 mg | ORAL_TABLET | Freq: Three times a day (TID) | ORAL | Status: DC | PRN

## 2015-12-30 MED ORDER — ALBUTEROL SULFATE HFA 108 (90 BASE) MCG/ACT IN AERS: 2.0000 | INHALATION_SPRAY | Freq: Four times a day (QID) | RESPIRATORY_TRACT | Status: DC | PRN

## 2015-12-30 MED ORDER — FUROSEMIDE 40 MG PO TABS: 40.0000 mg | ORAL_TABLET | Freq: Every day | ORAL | Status: DC

## 2015-12-30 MED ORDER — POTASSIUM CHLORIDE CRYS ER 20 MEQ PO TBCR: 40.0000 meq | EXTENDED_RELEASE_TABLET | Freq: Every day | ORAL | Status: DC

## 2015-12-30 MED ORDER — MELOXICAM 15 MG PO TABS: 15.0000 mg | ORAL_TABLET | Freq: Every day | ORAL | Status: DC | PRN

## 2015-12-30 MED ORDER — CARVEDILOL 6.25 MG PO TABS: 6.2500 mg | ORAL_TABLET | Freq: Two times a day (BID) | ORAL | Status: DC

## 2015-12-30 MED ORDER — INSULIN ASPART 100 UNIT/ML FLEXPEN: 2.0000 [IU] | PEN_INJECTOR | Freq: Three times a day (TID) | SUBCUTANEOUS | Status: DC | PRN

## 2015-12-30 MED ORDER — HYDROXYCHLOROQUINE SULFATE 200 MG PO TABS: 200.0000 mg | ORAL_TABLET | Freq: Every morning | ORAL | Status: DC

## 2015-12-30 MED ORDER — LEVOCETIRIZINE DIHYDROCHLORIDE 5 MG PO TABS: 5.0000 mg | ORAL_TABLET | Freq: Every morning | ORAL | Status: DC

## 2015-12-30 MED ORDER — PANTOPRAZOLE SODIUM 20 MG PO TBEC: 20.0000 mg | DELAYED_RELEASE_TABLET | Freq: Every day | ORAL | Status: DC

## 2015-12-30 MED ORDER — FOLIC ACID 1 MG PO TABS
1.0000 mg | ORAL_TABLET | Freq: Every morning | ORAL | Status: DC
Start: 2015-12-30 — End: 2016-01-04

## 2015-12-30 MED ORDER — GUAIFENESIN 100 MG/5ML PO SOLN: 5.0000 mL | ORAL | Status: DC | PRN

## 2015-12-30 NOTE — Progress Notes (Signed)
Pt discharged to home with daughter.  Pt verbalized understanding of discharge instructions and follow up care.  All belongings sent home with pt.  Education provided re: Heart Failure and when to call MD, new medications and follow up care.    Kathryne Sharper RN, BSN

## 2015-12-30 NOTE — Discharge Summary (Signed)
Physician Discharge Summary  Christine Mcdowell [id number] DOB: Sep 04, 1971 DOA: 12/26/2015  PCP: Maximino Greenland, MD  Admit date: 12/26/2015 Discharge date: 12/30/2015  Recommendations for Outpatient Follow-up:  1. Continue Lasix 40 mg daily along with potassium supplementation as previously taken at home 2. Follow up with primary care physician per scheduled appointment  Discharge Diagnoses:  Active Problems:   Shortness of breath   SLE (systemic lupus erythematosus) (HCC)   Cardiomyopathy, dilated (HCC)   Hypokalemia   Essential hypertension   Type 2 diabetes mellitus (HCC)   Acute on chronic systolic CHF (congestive heart failure), NYHA class 3 (HCC)   Acute on chronic systolic congestive heart failure (HCC)   GERD (gastroesophageal reflux disease)   Anxiety state    Discharge Condition: stable   Diet recommendation: as tolerated   History of present illness:  45 year old female with past medical history of SLE, asthma, 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 Patient reports shortness of breath worse with exertion. She also reported associated lower extremity swelling. She reports history of noncompliance with medications for lupus because of issues with insurance.  On admission, patient was hemodynamically stable. Chest x-ray showed left lower lobe airspace opacity with small left effusion and mild cardiomegaly.BNP was 533 on the admission. She has been seen by cardiology in consultation. She was started on IV Lasix.  Hospital Course:   Assessment/Plan:    Principal Problem:  Acute combined systolic and diastolic heart failure (HCC) / acute respiratory  failure with hypoxia - BNP on this admission is 533. Patient has had a cardiac MRI in March 2016 which showed ejection fraction of 45%. - 2-D echo on this admission shows ejection fraction 35-40%. Left ventricular diastolic function parameters were normal. There was a diffuse hypokinesis and trivial pericardial effusion in the posterior to the heart but not consistent with tamponade - CT chest done 12/29/2015 with findings of cardiomegaly and trace left effusion and pericardial effusion. No acute findings. - Patient was on Lasix 60 mg IV every 12 hours and then switched to PO regimen 12/28/2015 - Patient will take 40 mg Lasix on daily basis along with potassium supplementation - Continue daily aspirin - Continue Coreg 6.25 mg twice daily, lisinopril 20 mg daily   Active Problems:   Anxiety - Started low dose xanax but patient says it makes her drowsy so we stopped it.   SLE (systemic lupus erythematosus) (HCC) - Continue plaquenil. She was on Solu-Medrol 60 mg IV Q 24 hours but this was changed to prednisone 20 mg daily 12/28/2015. Patient takes prednisone 20 mg daily at home on daily basis   Hypokalemia / hypomagnesemia - Secondary to Lasix. - Continue to supplement   Essential hypertension - Continue Coreg, Lasix and lisinopril   Possible lobar pneumonia,unspecified organism - Has completed 4 days of azithromycin. Azithromycin not needed on discharge.  DVT Prophylaxis  - Lovenox subQ while pt in hospital   Code Status: Full.  Family Communication: plan of care discussed with the patient    IV access:  Peripheral IV  Procedures and diagnostic studies:   Dg Chest 2 View 12/26/2015 Left lower lobe airspace opacity with small left effusion. Cannot exclude pneumonia. Mild cardiomegaly. Electronically Signed By: Rolm Baptise M.D.  On: 12/26/2015 12:05   Medical Consultants:  Cardiology  Other Consultants:  None  IAnti-Infectives:   Azithromycin  12/27/2015 --> 12/30/2015   Signed:  Leisa Lenz, MD  [facility] 12/30/2015, 10:00 AM  Pager #: [phone]  Time spent in minutes: more than 30 minutes   Discharge Exam: Filed Vitals:   12/29/15 2059 12/30/15 0652  BP: 113/75 117/82  Pulse: 76 77  Temp: 97.8 F (36.6 C) 98.8 F (37.1 C)  Resp: 16 18   Filed Vitals:   12/29/15 0833 12/29/15 1330 12/29/15 2059 12/30/15 0652  BP:  111/73 113/75 117/82  Pulse:  81 76 77  Temp:  98.6 F (37 C) 97.8 F (36.6 C) 98.8 F (37.1 C)  TempSrc:  Oral Oral Oral  Resp:  16 16 18   Height:      Weight:    172 lb 6.4 oz (78.2 kg)  SpO2: 99% 99% 99% 100%    General: Pt is alert, follows commands appropriately, not in acute distress Cardiovascular: Regular rate and rhythm, S1/S2 +, no murmurs Respiratory: Clear to auscultation bilaterally, no wheezing, no crackles, no rhonchi Abdominal: Soft, non tender, non distended, bowel sounds +, no guarding Extremities: no edema, no cyanosis, pulses palpable bilaterally DP and PT Neuro: Grossly nonfocal  Discharge Instructions  Discharge Instructions    Call MD for:  difficulty breathing, headache or visual disturbances    Complete by:  As directed      Call MD for:  persistant dizziness or light-headedness    Complete by:  As directed      Call MD for:  persistant nausea and vomiting    Complete by:  As directed      Call MD for:  severe uncontrolled pain    Complete by:  As directed      Diet - low sodium heart healthy    Complete by:  As directed      Increase activity slowly    Complete by:  As directed             Medication List    STOP taking these medications        HYDROcodone-acetaminophen 5-325 MG tablet  Commonly known as:  NORCO/VICODIN      TAKE these medications        ACTEMRA 162 MG/0.9ML Sosy  Generic drug:  Tocilizumab  Inject 162 mg into the skin every 14 (fourteen) days.     albuterol 108 (90 Base) MCG/ACT inhaler  Commonly known as:   PROVENTIL HFA;VENTOLIN HFA  Inhale 2 puffs into the lungs every 6 (six) hours as needed for wheezing or shortness of breath.     albuterol (2.5 MG/3ML) 0.083% nebulizer solution  Commonly known as:  PROVENTIL  Take 3 mLs (2.5 mg total) by nebulization every 6 (six) hours as needed for wheezing or shortness of breath.     aspirin 81 MG chewable tablet  Chew 162 mg by mouth every morning.     carvedilol 6.25 MG tablet  Commonly known as:  COREG  Take 1 tablet (6.25 mg total) by mouth 2 (two) times daily with a meal.     folic acid 1 MG tablet  Commonly known as:  FOLVITE  Take 1 tablet (1 mg total) by mouth every morning.     furosemide 40 MG tablet  Commonly known as:  LASIX  Take 1 tablet (40 mg total) by mouth daily.     guaiFENesin 100 MG/5ML Soln  Commonly known as:  ROBITUSSIN  Take 5 mLs (100 mg total) by mouth every 4 (four) hours as needed for cough or to loosen phlegm.     hydrocortisone cream 1 %  Apply 1 application topically 4 (four) times daily as needed for itching (rash).     hydroxychloroquine 200 MG tablet  Commonly known as:  PLAQUENIL  Take 1 tablet (200 mg total) by mouth every morning.     ibuprofen 200 MG tablet  Commonly known as:  ADVIL,MOTRIN  Take 600 mg by mouth every 6 (six) hours as needed for headache or moderate pain.     insulin aspart 100 UNIT/ML FlexPen  Commonly known as:  NOVOLOG FLEXPEN  Inject 2-10 Units into the skin 3 (three) times daily as needed for high blood sugar. SLIDING SCALE     insulin detemir 100 UNIT/ML injection  Commonly known as:  LEVEMIR  Inject 0.15-0.3 mLs (15-30 Units total) into the skin 2 (two) times daily as needed (for high blood sugar). She takes 15 units in the morning and 30 units at bedtime.     levocetirizine 5 MG tablet  Commonly known as:  XYZAL  Take 1 tablet (5 mg total) by mouth every morning.     lisinopril 20 MG tablet  Commonly known as:  PRINIVIL,ZESTRIL  Take 1 tablet (20 mg total) by mouth  daily.     loratadine 10 MG tablet  Commonly known as:  CLARITIN  Take 1 tablet (10 mg total) by mouth daily.     meloxicam 15 MG tablet  Commonly known as:  MOBIC  Take 1 tablet (15 mg total) by mouth daily as needed. Inflammation or pain     methotrexate 2.5 MG tablet  Commonly known as:  RHEUMATREX  Take 20 mg by mouth once a week. Take 8 tablets by mouth once a week on Wednesday     MUCINEX FAST-MAX CONGEST COUGH 2.5-5-100 MG/5ML Liqd  Generic drug:  Phenylephrine-DM-GG  Take 1 Dose by mouth 3 (three) times daily as needed (cold symptoms).     multivitamin with minerals Tabs tablet  Take 1 tablet by mouth every morning.     ondansetron 4 MG tablet  Commonly known as:  ZOFRAN  Take 1 tablet (4 mg total) by mouth every 8 (eight) hours as needed for nausea or vomiting.     pantoprazole 20 MG tablet  Commonly known as:  PROTONIX  Take 1 tablet (20 mg total) by mouth daily.     potassium chloride SA 20 MEQ tablet  Commonly known as:  K-DUR,KLOR-CON  Take 2 tablets (40 mEq total) by mouth daily.     predniSONE 20 MG tablet  Commonly known as:  DELTASONE  Take 1 tablet (20 mg total) by mouth daily with breakfast.     torsemide 10 MG tablet  Commonly known as:  DEMADEX  Take 10 mg by mouth daily as needed (fluid).     traZODone 50 MG tablet  Commonly known as:  DESYREL  Take 50 mg by mouth at bedtime as needed for sleep.           Follow-up Information    Follow up with Jenkins Rouge, MD.   Specialty:  Cardiology   Why:  Office will call you for your followup appointment. Call office if you have not heard back in 3 days.   Contact information:   [id number] [place] [address] [phone]       Follow up with Maximino Greenland,  MD. Schedule an appointment as soon as possible for a visit in 1 week.   Specialty:  Internal Medicine   Why:  Follow up appt after recent hospitalization   Contact information:   [address] [place] [address] [phone]        The results of significant diagnostics from this hospitalization (including imaging, microbiology, ancillary and laboratory) are listed below for reference.    Significant Diagnostic Studies: Dg Chest 2 View  12/26/2015  CLINICAL DATA:  Chest pain. Shortness of breath, wheezing over last 2-3 days EXAM: CHEST  2 VIEW COMPARISON:  11/18/2015 FINDINGS: Cardiomegaly. Left lower lobe airspace opacity and small left effusion. Cannot exclude pneumonia. No confluent opacity on the right. No acute bony abnormality. IMPRESSION: Left lower lobe airspace opacity with small left effusion. Cannot exclude pneumonia. Mild cardiomegaly. Electronically Signed   By: Rolm Baptise M.D.   On: 12/26/2015 12:05   Ct Chest Wo Contrast  12/29/2015  CLINICAL DATA:  45 year old with shortness of breath. Lupus myopericarditis. CHF. Hypertension, sharp chest pain, cough. EXAM: CT CHEST WITHOUT CONTRAST TECHNIQUE: Multidetector CT imaging of the chest was performed following the standard protocol without IV contrast. COMPARISON:  Chest x-ray 12/28/2015.  Chest CT 01/26/2014. FINDINGS: Linear densities in the left lower lobe could reflect scarring or atelectasis. No confluent opacity on the right. Trace pericardial effusion and left effusion. No right effusion. Heart is enlarged. Aorta is normal caliber. No mediastinal, hilar, or axillary adenopathy. Chest wall soft tissues are unremarkable. Imaging into the upper abdomen shows no acute findings. No acute bony abnormality. IMPRESSION: Cardiomegaly.  Trace left effusion and pericardial effusion. Left base atelectasis or scarring. Electronically Signed   By: Rolm Baptise M.D.   On: 12/29/2015 15:20   Dg Chest Port 1 View  12/28/2015  CLINICAL DATA:  Shortness of Breath EXAM: PORTABLE CHEST - 1 VIEW COMPARISON:  12/26/2015 FINDINGS: Cardiac shadow is mildly enlarged but stable. Persistent left basilar infiltrate with associated small effusion  is seen. No new focal abnormality is noted. No bony abnormality is seen. IMPRESSION: Stable left basilar changes. Electronically Signed   By: Inez Catalina M.D.   On: 12/28/2015 09:18    Microbiology: No results found for this or any previous visit (from the past 240 hour(s)).   Labs: Basic Metabolic Panel:  Recent Labs Lab 12/26/15 1132 12/27/15 0330 12/28/15 0505 12/29/15 0508 12/30/15 0628  NA 144 140 142 141 141  K 3.1* 4.0 3.0* 3.7 3.6  CL 111 106 105 105 105  CO2 21* 24 27 26 26   GLUCOSE 85 133* 91 98 91  BUN 11 11 15 14 12   CREATININE 0.77 0.67 0.78 0.74 0.71  CALCIUM 9.1 8.6* 8.1* 8.5* 8.7*  MG  --   --  1.6* 2.2  --    Liver Function Tests: No results for input(s): AST, ALT, ALKPHOS, BILITOT, PROT, ALBUMIN in the last 168 hours. No results for input(s): LIPASE, AMYLASE in the last 168 hours. No results for input(s): AMMONIA in the last 168 hours. CBC:  Recent Labs Lab 12/26/15 1132  WBC 7.5  HGB 11.0*  HCT 33.6*  MCV 85.1  PLT 200   Cardiac Enzymes:  Recent Labs Lab 12/26/15 1608 12/26/15 2210 12/27/15 0317 12/28/15 0940  CKTOTAL 76  --   --   --   CKMB 4.8  --   --   --   TROPONINI <0.03 <0.03 <0.03 <0.03   BNP: BNP (last 3 results)  Recent Labs  11/18/15 1400 12/26/15 1132 12/28/15 0505  BNP 73.6 533.5* 150.1*    ProBNP (last 3 results)  Recent Labs  02/03/15 1033 02/28/15 1005  PROBNP 90.0 42.0    CBG:  Recent Labs Lab 12/28/15 0800 12/28/15 1146  GLUCAP 94 121*

## 2015-12-30 NOTE — Discharge Instructions (Signed)

## 2016-01-01 NOTE — Telephone Encounter (Signed)
LMTCB for pt at home number listed.

## 2016-01-02 NOTE — Telephone Encounter (Signed)
Patient contacted regarding discharge from [facility]  on 12/30/15.  Patient understands to follow up with provider Margaret Pyle on 01/04/16 at 12:10 at [facility].. Patient understands discharge instructions? yes Patient understands medications and regiment? yes Patient understands to bring all medications to this visit? yes  Spoke w/daughter who states pt is at work.  States she is doing much better. Asked daughter to have her Mother call our office.  States she is aware of her appointment on 01/04/16 at 12:10 with Margaret Pyle.  Advised her to tell her mother to bring her medications to office at visit.

## 2016-01-03 NOTE — Progress Notes (Signed)
Cardiology Office Note    Date:  01/04/2016   ID:  Christine Mcdowell, DOB 10-06-1971, MRN [id number]  PCP:  Christine Greenland, MD  Cardiologist:  Dr. Jenkins Mcdowell   Electrophysiologist:  n/a  Chief Complaint  Patient presents with  . Hospitalization Follow-up    admit with a/c CHF    History of Present Illness:  Christine Mcdowell is a 45 y.o. female with a hx of advanced lupus with associated myocarditis/cardiomyopathy (dx in 01/2014) with EF 40-45%, HTN, DM, DJD, fibromyalgia, asthma. Last seen by Dr. Jenkins Mcdowell in 7/16.  Event monitor for palpitations demonstrated no arrhythmias.    Admitted 1/3-1/7 with acute on chronic combined systolic and diastolic CHF in the setting of community acquired pneumonia. She was diuresed with IV Lasix and treated with antibiotics.  Echocardiogram demonstrated EF 35-40%.  Serologic studies apparently did not suggest active lupus. She had been off of her medication secondary to finances. She was placed back on steroids.  Returns for follow-up.  She is doing fairly well. She denies chest pain, syncope.  She sleeps on an incline chronically. She notes mild pedal edema without change.  Her weight has increased since DC by 4 lbs.  She notes improved cough.  She still has a lot of upper airway congestion and sees her PCP today to discuss this further.  Since resuming her prednisone, she feels her breathing is better.     Past Medical History  Diagnosis Date  . Lupus (systemic lupus erythematosus) ([place])   . Arthritis   . Hypertension   . Asthma   . Fibromyalgia   . CHF (congestive heart failure) ([place])   . 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 Hx of echocardiogram     Echo (9/15):  EF 50-55%, ant HK, Gr 1 DD, mild MR, mild LAE, no effusion  . Hx of cardiovascular stress test     ETT-Myoview (9/15):  No ischemia, EF 52%; NORMAL    Past Surgical History  Procedure Laterality Date  . Knee surgery    . Tubal ligation     . Abdominal hysterectomy  2009    Current Medications: Previous Medications   ACTEMRA 162 MG/0.9ML SOSY    Inject 162 mg into the skin every 14 (fourteen) days.   ALBUTEROL (PROVENTIL HFA;VENTOLIN HFA) 108 (90 BASE) MCG/ACT INHALER    Inhale 2 puffs into the lungs every 6 (six) hours as needed for wheezing or shortness of breath.   ALBUTEROL (PROVENTIL) (2.5 MG/3ML) 0.083% NEBULIZER SOLUTION    Take 3 mLs (2.5 mg total) by nebulization every 6 (six) hours as needed for wheezing or shortness of breath.   ASPIRIN 81 MG CHEWABLE TABLET    Chew 162 mg by mouth every morning.    GUAIFENESIN (ROBITUSSIN) 100 MG/5ML SOLN    Take 5 mLs (100 mg total) by mouth every 4 (four) hours as needed for cough or to loosen phlegm.   HYDROCORTISONE CREAM 1 %    Apply 1 application topically 4 (four) times daily as needed for itching (rash).   HYDROXYCHLOROQUINE (PLAQUENIL) 200 MG TABLET    Take 1 tablet (200 mg total) by mouth every morning.   IBUPROFEN (ADVIL,MOTRIN) 200 MG TABLET    Take 600 mg by mouth every 6 (six) hours as needed for headache or moderate pain.   INSULIN ASPART (NOVOLOG FLEXPEN) 100 UNIT/ML FLEXPEN    Inject 2-10 Units into the skin 3 (three) times daily as needed for  high blood sugar. SLIDING SCALE   INSULIN DETEMIR (LEVEMIR) 100 UNIT/ML INJECTION    Inject 0.15-0.3 mLs (15-30 Units total) into the skin 2 (two) times daily as needed (for high blood sugar). She takes 15 units in the morning and 30 units at bedtime.   LEVOCETIRIZINE (XYZAL) 5 MG TABLET    Take 1 tablet (5 mg total) by mouth every morning.   LISINOPRIL (PRINIVIL,ZESTRIL) 20 MG TABLET    Take 1 tablet (20 mg total) by mouth daily.   LORATADINE (CLARITIN) 10 MG TABLET    Take 1 tablet (10 mg total) by mouth daily.   MELOXICAM (MOBIC) 15 MG TABLET    Take 1 tablet (15 mg total) by mouth daily as needed. Inflammation or pain   METHOTREXATE (RHEUMATREX) 2.5 MG TABLET    Take 20 mg by mouth once a week. Take 8 tablets by mouth once a  week on Wednesday   MULTIPLE VITAMIN (MULTIVITAMIN WITH MINERALS) TABS    Take 1 tablet by mouth every morning.    ONDANSETRON (ZOFRAN) 4 MG TABLET    Take 1 tablet (4 mg total) by mouth every 8 (eight) hours as needed for nausea or vomiting.   PANTOPRAZOLE (PROTONIX) 20 MG TABLET    Take 1 tablet (20 mg total) by mouth daily.   PHENYLEPHRINE-DM-GG (MUCINEX FAST-MAX CONGEST COUGH) 2.5-5-100 MG/5ML LIQD    Take 1 Dose by mouth 3 (three) times daily as needed (cold symptoms).   PREDNISONE (DELTASONE) 20 MG TABLET    Take 1 tablet (20 mg total) by mouth daily with breakfast.   TRAZODONE (DESYREL) 50 MG TABLET    Take 50 mg by mouth at bedtime as needed for sleep.      Allergies:   Dilaudid; Erythromycin; Hydromorphone; Iodinated diagnostic agents; and Latex   Social History   Social History  . Marital Status: Divorced    Spouse Name: N/A  . Number of Children: N/A  . Years of Education: N/A   Social History Main Topics  . Smoking status: Never Smoker   . Smokeless tobacco: Never Used  . Alcohol Use: No  . Drug Use: No  . Sexual Activity: Not Asked   Other Topics Concern  . None   Social History Narrative     Family History:  The patient's family history includes Diabetes in her other; Heart attack in her father and paternal grandmother; Hypertension in her other.   ROS:   Please see the history of present illness.    Review of Systems  HENT: Positive for headaches.   Cardiovascular: Positive for dyspnea on exertion and leg swelling.  Respiratory: Positive for cough and shortness of breath.   Skin: Positive for rash.  Musculoskeletal: Positive for joint pain, joint swelling and myalgias.  All other systems reviewed and are negative.   PHYSICAL EXAM:   VS:  BP 128/78 mmHg  Pulse 88  Ht 5\' 7"  (1.702 m)  Wt 176 lb 12.8 oz (80.196 kg)  BMI 27.68 kg/m2   GEN: Well nourished, well developed, in no acute distress HEENT: normal Neck: no JVD, no masses Cardiac: Normal S1/S2,  RRR; no murmurs, rubs, or gallops, no edema;     Respiratory:  clear to auscultation bilaterally; no wheezing, rhonchi or rales GI: soft, nontender, nondistended MS: no deformity or atrophy Skin: warm and dry, no rash Neuro:   no focal deficits  Psych: Alert and oriented x 3, normal affect  Wt Readings from Last 3 Encounters:  01/04/16 176 lb  12.8 oz (80.196 kg)  12/30/15 172 lb 6.4 oz (78.2 kg)  07/10/15 170 lb 1.9 oz (77.166 kg)      Studies/Labs Reviewed:   EKG:  EKG is not ordered today.  The ekg ordered today demonstrates n/a  Recent Labs: 02/28/2015: Pro B Natriuretic peptide (BNP) 42.0 11/18/2015: ALT 14 12/26/2015: Hemoglobin 11.0*; Platelets 200 12/28/2015: B Natriuretic Peptide 150.1* 12/29/2015: Magnesium 2.2 12/30/2015: BUN 12; Creatinine, Ser 0.71; Potassium 3.6; Sodium 141   Recent Lipid Panel No results found for: CHOL, TRIG, HDL, CHOLHDL, VLDL, LDLCALC, LDLDIRECT  Additional studies/ records that were reviewed today include:   Echo 12/27/15 Mild LVH, EF 35-40%, diff HK, normal diastolic function, mild MR, trivial pericardial effusion  Event Monitor 08/17/15 NSR No arrhythmias No correlation with palpitations or chest pain   Cardiac MRI 03/02/15:  IMPRESSION: 1) Moderate LVE diffuse hypokinesis EF 46% Similar to MRI done 01/28/14 2) No delayed gadolinium uptake or evidence of myocarditis 3) Normal RA/RV/LA 4) Trivial posterior pericardial effusion  Myoview 9/15 No ischemia, EF 52%, normal  Echo (3/15):  Mild LVH, EF 35-40%, diff HK, mild MR.   ASSESSMENT:    1. Chronic combined systolic and diastolic congestive heart failure (HCC)   2. Cardiomyopathy, dilated ([place])   3. Lupus ([place])   4. Essential hypertension   5. Cough     PLAN:  In order of problems listed above:  1. Chronic combined systolic and diastolic HF - She notes a 4 pound weight gain since discharge from the hospital. Overall, her breathing is stable. Her exam seems to be stable. I have  asked her to take an extra dose of Lasix today and resume her usual dose of Lasix tomorrow. She will continue to monitor her weights closely and notify us if there is a significant increase.  She has FU scheduled with Dr. Jenkins Mcdowell at the end of this month.  Keep this for now for close FU.    2. Dilated cardiomyopathy - MRI in 2016 with EF 46%. Recent echocardiogram with EF 35-40%. This was felt to be close to her baseline. Continue beta blocker, ACE inhibitor.  3. Lupus - Follow-Up with rheumatology as planned.  4. HTN - Controlled. Continue current therapy.  5. Cough - She continues to have significant congestion. She was treated for pneumonia in the hospital and there was consolidation on her chest x-ray. She currently has a clear chest exam. She sees her primary care doctor later today. Further recommendations per primary care.    Medication Adjustments/Labs and Tests Ordered: Current medicines are reviewed at length with the patient today.  Concerns regarding medicines are outlined above.  Medication changes, Labs and Tests ordered today are outlined in the Patient Instructions noted below.  Signed, Richardson Dopp, PA-C  01/04/2016 1:48 PM    [facility] [address], [place], [place]  [address] Phone: [phone]; Fax: [phone]   Patient Instructions  Medication Instructions:   Your physician recommends that you continue on your current medications as directed. Please refer to the Current Medication list given to you today.  EXCEPT FOR TODAY ONLY  TAKE AN EXTRA LASIX  THEN TOMORROW RESUME BACK TO ONE TABLET DAILY   If you need a refill on your cardiac medications before your next appointment, please call your pharmacy.  Labwork: NONE ORDER TODAY   Testing/Procedures: NONE ORDER TODAY   Follow-Up: AS SCHEDULED WITH DR Johnsie Cancel    Any Other Special Instructions Will Be Listed Below (  If Applicable).

## 2016-01-04 ENCOUNTER — Ambulatory Visit (INDEPENDENT_AMBULATORY_CARE_PROVIDER_SITE_OTHER): Payer: 59 | Admitting: Physician Assistant

## 2016-01-04 ENCOUNTER — Encounter: Payer: Self-pay | Admitting: Physician Assistant

## 2016-01-04 VITALS — BP 128/78 | HR 88 | Ht 67.0 in | Wt 176.8 lb

## 2016-01-04 DIAGNOSIS — I5042 Chronic combined systolic (congestive) and diastolic (congestive) heart failure: Secondary | ICD-10-CM | POA: Diagnosis not present

## 2016-01-04 DIAGNOSIS — M329 Systemic lupus erythematosus, unspecified: Secondary | ICD-10-CM | POA: Diagnosis not present

## 2016-01-04 DIAGNOSIS — I10 Essential (primary) hypertension: Secondary | ICD-10-CM

## 2016-01-04 DIAGNOSIS — I420 Dilated cardiomyopathy: Secondary | ICD-10-CM | POA: Diagnosis not present

## 2016-01-04 DIAGNOSIS — R05 Cough: Secondary | ICD-10-CM

## 2016-01-04 DIAGNOSIS — R059 Cough, unspecified: Secondary | ICD-10-CM

## 2016-01-04 MED ORDER — POTASSIUM CHLORIDE CRYS ER 20 MEQ PO TBCR: 40.0000 meq | EXTENDED_RELEASE_TABLET | Freq: Every day | ORAL | Status: DC

## 2016-01-04 MED ORDER — FUROSEMIDE 40 MG PO TABS: 40.0000 mg | ORAL_TABLET | Freq: Every day | ORAL | Status: DC

## 2016-01-04 MED ORDER — FOLIC ACID 1 MG PO TABS: 1.0000 mg | ORAL_TABLET | Freq: Every morning | ORAL | Status: DC

## 2016-01-04 MED ORDER — TORSEMIDE 10 MG PO TABS: 10.0000 mg | ORAL_TABLET | Freq: Every day | ORAL | Status: DC | PRN

## 2016-01-04 MED ORDER — CARVEDILOL 6.25 MG PO TABS: 6.2500 mg | ORAL_TABLET | Freq: Two times a day (BID) | ORAL | Status: DC

## 2016-01-04 NOTE — Patient Instructions (Addendum)
Medication Instructions:   Your physician recommends that you continue on your current medications as directed. Please refer to the Current Medication list given to you today.  EXCEPT FOR TODAY ONLY  TAKE AN EXTRA LASIX  THEN TOMORROW RESUME BACK TO ONE TABLET DAILY   If you need a refill on your cardiac medications before your next appointment, please call your pharmacy.  Labwork: NONE ORDER TODAY   Testing/Procedures: NONE ORDER TODAY   Follow-Up: AS SCHEDULED WITH DR Johnsie Cancel    Any Other Special Instructions Will Be Listed Below (If Applicable).

## 2016-01-12 ENCOUNTER — Telehealth: Payer: Self-pay | Admitting: Cardiovascular Disease

## 2016-01-12 NOTE — Telephone Encounter (Signed)
Consulted Dr. Johnsie Cancel, he thinks elevated BP is related to her Lupus. Patient encourage to record her BP's over the weekend and follow-up with Dr. Johnsie Cancel as planned. If BP's do not improve please call office for advisement. Patient verbalized understanding.

## 2016-01-12 NOTE — Telephone Encounter (Signed)
New message      Pt c/o BP issue: STAT if pt c/o blurred vision, one-sided weakness or slurred speech  1. What are your last 5 BP readings?  169/108, 168/111 2. Are you having any other symptoms (ex. Dizziness, headache, blurred vision, passed out)?  headache  3. What is your BP issue? bp is high

## 2016-01-16 NOTE — Progress Notes (Signed)
Patient ID: Christine Mcdowell, female   DOB: 05-Sep-1971, 45 y.o.   MRN: [id number]    Cardiology Office Note    Date:  01/22/2016   ID:  Christine Mcdowell, DOB 05/08/1971, MRN [id number]  PCP:  Maximino Greenland, MD  Cardiologist:  Dr. Jenkins Rouge   Electrophysiologist:  n/a  Dyspnea   History of Present Illness:  Christine Mcdowell is a 45 y.o. female with a hx of advanced lupus with associated myocarditis/cardiomyopathy (dx in 01/2014) with EF 40-45%, HTN, DM, DJD, fibromyalgia, asthma. Last seen by me  in 7/16.  Event monitor for palpitations demonstrated no arrhythmias.    Admitted 1/3-1/7 with acute on chronic combined systolic and diastolic CHF in the setting of community acquired pneumonia. She was diuresed with IV Lasix and treated with antibiotics.  Echocardiogram demonstrated EF 35-40%.  Serologic studies apparently did not suggest active lupus. She had been off of her medication secondary to finances. She was placed back on steroids.  Returns for follow-up.  She is doing fairly well. She denies chest pain, syncope.  She sleeps on an incline chronically. She notes mild pedal edema without change.  Her weight has increased since DC by 4 lbs.  She notes improved cough.  She still has a lot of upper airway congestion and sees her PCP  to discuss this further.    Issues with palpitations ? PaC;s  Some PND/orthopnea.  Some edema  Alopecia with chemo for lupus     Past Medical History  Diagnosis Date  . Lupus (systemic lupus erythematosus) ([place])   . Arthritis   . Hypertension   . Asthma   . Fibromyalgia   . CHF (congestive heart failure) ([place])   . 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 Hx of echocardiogram     Echo (9/15):  EF 50-55%, ant HK, Gr 1 DD, mild MR, mild LAE, no effusion  . Hx of cardiovascular stress test     ETT-Myoview (9/15):  No ischemia, EF 52%; NORMAL    Past Surgical History  Procedure Laterality Date  . Knee surgery    .  Tubal ligation    . Abdominal hysterectomy  2009    Current Medications: Previous Medications   ACTEMRA 162 MG/0.9ML SOSY    Inject 162 mg into the skin every 14 (fourteen) days.   ALBUTEROL (PROVENTIL HFA;VENTOLIN HFA) 108 (90 BASE) MCG/ACT INHALER    Inhale 2 puffs into the lungs every 6 (six) hours as needed for wheezing or shortness of breath.   ALBUTEROL (PROVENTIL) (2.5 MG/3ML) 0.083% NEBULIZER SOLUTION    Take 3 mLs (2.5 mg total) by nebulization every 6 (six) hours as needed for wheezing or shortness of breath.   ASPIRIN 81 MG CHEWABLE TABLET    Chew 162 mg by mouth every morning.    CARVEDILOL (COREG) 6.25 MG TABLET    Take 1 tablet (6.25 mg total) by mouth 2 (two) times daily with a meal.   FOLIC ACID (FOLVITE) 1 MG TABLET    Take 1 tablet (1 mg total) by mouth every morning.   FUROSEMIDE (LASIX) 40 MG TABLET    Take 1 tablet (40 mg total) by mouth daily.   GUAIFENESIN (ROBITUSSIN) 100 MG/5ML SOLN    Take 5 mLs (100 mg total) by mouth every 4 (four) hours as needed for cough or to loosen phlegm.   HYDROCORTISONE CREAM 1 %    Apply 1 application topically 4 (four) times daily as needed for  itching (rash).   HYDROXYCHLOROQUINE (PLAQUENIL) 200 MG TABLET    Take 1 tablet (200 mg total) by mouth every morning.   IBUPROFEN (ADVIL,MOTRIN) 200 MG TABLET    Take 600 mg by mouth every 6 (six) hours as needed for headache or moderate pain.   INSULIN ASPART (NOVOLOG FLEXPEN) 100 UNIT/ML FLEXPEN    Inject 2-10 Units into the skin 3 (three) times daily as needed for high blood sugar. SLIDING SCALE   INSULIN DETEMIR (LEVEMIR) 100 UNIT/ML INJECTION    Inject 0.15-0.3 mLs (15-30 Units total) into the skin 2 (two) times daily as needed (for high blood sugar). She takes 15 units in the morning and 30 units at bedtime.   LEVOCETIRIZINE (XYZAL) 5 MG TABLET    Take 1 tablet (5 mg total) by mouth every morning.   LISINOPRIL (PRINIVIL,ZESTRIL) 20 MG TABLET    Take 1 tablet (20 mg total) by mouth daily.    LORATADINE (CLARITIN) 10 MG TABLET    Take 1 tablet (10 mg total) by mouth daily.   MELOXICAM (MOBIC) 15 MG TABLET    Take 1 tablet (15 mg total) by mouth daily as needed. Inflammation or pain   METHOTREXATE (RHEUMATREX) 2.5 MG TABLET    Take 20 mg by mouth once a week. Take 8 tablets by mouth once a week on Wednesday   MULTIPLE VITAMIN (MULTIVITAMIN WITH MINERALS) TABS    Take 1 tablet by mouth every morning.    ONDANSETRON (ZOFRAN) 4 MG TABLET    Take 1 tablet (4 mg total) by mouth every 8 (eight) hours as needed for nausea or vomiting.   PANTOPRAZOLE (PROTONIX) 20 MG TABLET    Take 1 tablet (20 mg total) by mouth daily.   PHENYLEPHRINE-DM-GG (MUCINEX FAST-MAX CONGEST COUGH) 2.5-5-100 MG/5ML LIQD    Take 1 Dose by mouth 3 (three) times daily as needed (cold symptoms).   POTASSIUM CHLORIDE SA (K-DUR,KLOR-CON) 20 MEQ TABLET    Take 2 tablets (40 mEq total) by mouth daily.   PREDNISONE (DELTASONE) 20 MG TABLET    Take 1 tablet (20 mg total) by mouth daily with breakfast.   TORSEMIDE (DEMADEX) 10 MG TABLET    Take 1 tablet (10 mg total) by mouth daily as needed (fluid).   TRAZODONE (DESYREL) 50 MG TABLET    Take 50 mg by mouth at bedtime as needed for sleep.      Allergies:   Dilaudid; Erythromycin; Hydromorphone; Iodinated diagnostic agents; and Latex   Social History   Social History  . Marital Status: Divorced    Spouse Name: N/A  . Number of Children: N/A  . Years of Education: N/A   Social History Main Topics  . Smoking status: Never Smoker   . Smokeless tobacco: Never Used  . Alcohol Use: No  . Drug Use: No  . Sexual Activity: Not Asked   Other Topics Concern  . None   Social History Narrative     Family History:  The patient's family history includes Diabetes in her other; Heart attack in her father and paternal grandmother; Hypertension in her other.   ROS:   Please see the history of present illness.    Review of Systems  HENT: Positive for headaches.     Cardiovascular: Positive for dyspnea on exertion and leg swelling.  Respiratory: Positive for cough and shortness of breath.   Skin: Positive for rash.  Musculoskeletal: Positive for joint pain, joint swelling and myalgias.  All other systems reviewed and are negative.  PHYSICAL EXAM:   VS:  BP 118/78 mmHg  Pulse 88  Ht 5\' 7"  (1.702 m)  Wt 79.379 kg (175 lb)  BMI 27.40 kg/m2   GEN: cushingoid black female  HEENT: alopecia with hair piece on  Neck: no JVD, no masses Cardiac: Normal S1/S2, RRR; no murmurs, rubs, or gallops, no edema;     Respiratory:  clear to auscultation bilaterally; no wheezing, rhonchi or rales GI: soft, nontender, nondistended MS: no deformity or atrophy Skin: warm and dry, no rash Neuro:   no focal deficits  Psych: Alert and oriented x 3, normal affect Trace LE edema bilateral   Wt Readings from Last 3 Encounters:  01/22/16 79.379 kg (175 lb)  01/04/16 80.196 kg (176 lb 12.8 oz)  12/30/15 78.2 kg (172 lb 6.4 oz)      Studies/Labs Reviewed:   EKG:   12/28/15  SR rate 88  LVH nonspecific ST/T wave changes   Recent Labs: 02/28/2015: Pro B Natriuretic peptide (BNP) 42.0 11/18/2015: ALT 14 12/26/2015: Hemoglobin 11.0*; Platelets 200 12/28/2015: B Natriuretic Peptide 150.1* 12/29/2015: Magnesium 2.2 12/30/2015: BUN 12; Creatinine, Ser 0.71; Potassium 3.6; Sodium 141   Recent Lipid Panel No results found for: CHOL, TRIG, HDL, CHOLHDL, VLDL, LDLCALC, LDLDIRECT  Additional studies/ records that were reviewed today include:   Echo 12/27/15 Mild LVH, EF 35-40%, diff HK, normal diastolic function, mild MR, trivial pericardial effusion  Event Monitor 08/17/15 NSR No arrhythmias No correlation with palpitations or chest pain   Cardiac MRI 03/02/15:  IMPRESSION: 1) Moderate LVE diffuse hypokinesis EF 46% Similar to MRI done 01/28/14 2) No delayed gadolinium uptake or evidence of myocarditis 3) Normal RA/RV/LA 4) Trivial posterior pericardial effusion  Myoview  9/15 No ischemia, EF 52%, normal  Echo (3/15):  Mild LVH, EF 35-40%, diff HK, mild MR.   ASSESSMENT:    No diagnosis found.  PLAN:  In order of problems listed above:  1. Chronic combined systolic and diastolic HF -  Demedex PRN  In addition to lasix check BMET and BNP   2. Dilated cardiomyopathy - MRI in 2016 with EF 46%. Recent echocardiogram with EF 35-40%. This was felt to be close to her baseline. Continue beta blocker, ACE inhibitor.  3. Lupus - Follow-Up with rheumatology as planned.  4. HTN - Controlled. Continue current therapy. Take additional lisinopril as needed   5. Cough - She continues to have significant congestion. She was treated for pneumonia in the hospital and there was consolidation on her chest x-ray. She currently has a clear chest exam. F/U primary Further recommendations per primary care.  6. Palpitations 21 day event monitor suspect just PAC;s    Medication Adjustments/Labs and Tests Ordered: Current medicines are reviewed at length with the patient today.  Concerns regarding medicines are outlined above.  Medication changes, Labs and Tests ordered today are outlined in the Patient Instructions noted below.   BMET/BNP  21 Day event montior   Signed, Jenkins Rouge, MD  01/22/2016 8:49 AM    [facility] [place], [place], [place]  [address] Phone: [phone]; Fax: [phone]   Patient Instructions  Medication Instructions:  Your physician recommends that you continue on your current medications as directed. Please refer to the Current Medication list given to you today.  Labwork: Your physician recommends that you have lab work today BMET and BNP   Testing/Procedures: Your physician has recommended that you wear an event monitor. Event monitors are medical devices that record  the heart's electrical activity. Doctors most often [place] these monitors to diagnose arrhythmias. Arrhythmias are problems with the speed  or rhythm of the heartbeat. The monitor is a small, portable device. You can wear one while you do your normal daily activities. This is usually used to diagnose what is causing palpitations/syncope (passing out).  Follow-Up: Your physician wants you to follow-up in: 8 weeks with Dr. Johnsie Cancel.  If you need a refill on your cardiac medications before your next appointment, please call your pharmacy.

## 2016-01-22 ENCOUNTER — Encounter: Payer: Self-pay | Admitting: Cardiovascular Disease

## 2016-01-22 ENCOUNTER — Ambulatory Visit (INDEPENDENT_AMBULATORY_CARE_PROVIDER_SITE_OTHER): Payer: 59 | Admitting: Cardiovascular Disease

## 2016-01-22 VITALS — BP 118/78 | HR 88 | Ht 67.0 in | Wt 175.0 lb

## 2016-01-22 DIAGNOSIS — R0600 Dyspnea, unspecified: Secondary | ICD-10-CM

## 2016-01-22 DIAGNOSIS — R002 Palpitations: Secondary | ICD-10-CM | POA: Diagnosis not present

## 2016-01-22 LAB — BASIC METABOLIC PANEL
BUN: 18 mg/dL (ref 7–25)
CO2: 27 mmol/L (ref 20–31)
Calcium: 9.1 mg/dL (ref 8.6–10.2)
Chloride: 102 mmol/L (ref 98–110)
Creat: 0.84 mg/dL (ref 0.50–1.10)
Glucose, Bld: 94 mg/dL (ref 65–99)
Potassium: 3.5 mmol/L (ref 3.5–5.3)
Sodium: 139 mmol/L (ref 135–146)

## 2016-01-22 NOTE — Patient Instructions (Addendum)
Medication Instructions:  Your physician recommends that you continue on your current medications as directed. Please refer to the Current Medication list given to you today.  Labwork: Your physician recommends that you have lab work today BMET and BNP   Testing/Procedures: Your physician has recommended that you wear an 21 day event monitor. Event monitors are medical devices that record the heart's electrical activity. Doctors most often [place] these monitors to diagnose arrhythmias. Arrhythmias are problems with the speed or rhythm of the heartbeat. The monitor is a small, portable device. You can wear one while you do your normal daily activities. This is usually used to diagnose what is causing palpitations/syncope (passing out).  Follow-Up: Your physician wants you to follow-up in: 8 weeks with Dr. Johnsie Cancel.  If you need a refill on your cardiac medications before your next appointment, please call your pharmacy.

## 2016-01-23 LAB — BRAIN NATRIURETIC PEPTIDE: Brain Natriuretic Peptide: 34 pg/mL (ref 0.0–100.0)

## 2016-01-25 ENCOUNTER — Ambulatory Visit (INDEPENDENT_AMBULATORY_CARE_PROVIDER_SITE_OTHER): Payer: 59

## 2016-01-25 DIAGNOSIS — R002 Palpitations: Secondary | ICD-10-CM

## 2016-03-04 ENCOUNTER — Emergency Department (HOSPITAL_COMMUNITY)
Admission: EM | Admit: 2016-03-04 | Discharge: 2016-03-04 | Disposition: A | Discharge status: Home / Self Care | Payer: 59 | Attending: Emergency Medicine | Admitting: Emergency Medicine

## 2016-03-04 ENCOUNTER — Encounter (HOSPITAL_COMMUNITY): Payer: Self-pay | Admitting: Emergency Medicine

## 2016-03-04 ENCOUNTER — Emergency Department (HOSPITAL_COMMUNITY): Payer: 59

## 2016-03-04 DIAGNOSIS — R000 Tachycardia, unspecified: Secondary | ICD-10-CM | POA: Diagnosis not present

## 2016-03-04 DIAGNOSIS — I10 Essential (primary) hypertension: Secondary | ICD-10-CM | POA: Diagnosis not present

## 2016-03-04 DIAGNOSIS — M1990 Unspecified osteoarthritis, unspecified site: Secondary | ICD-10-CM | POA: Insufficient documentation

## 2016-03-04 DIAGNOSIS — Z91040 Latex allergy status: Secondary | ICD-10-CM | POA: Diagnosis not present

## 2016-03-04 DIAGNOSIS — I509 Heart failure, unspecified: Secondary | ICD-10-CM | POA: Diagnosis not present

## 2016-03-04 DIAGNOSIS — R079 Chest pain, unspecified: Secondary | ICD-10-CM | POA: Diagnosis present

## 2016-03-04 DIAGNOSIS — Z79899 Other long term (current) drug therapy: Secondary | ICD-10-CM | POA: Diagnosis not present

## 2016-03-04 DIAGNOSIS — E119 Type 2 diabetes mellitus without complications: Secondary | ICD-10-CM | POA: Diagnosis not present

## 2016-03-04 DIAGNOSIS — M329 Systemic lupus erythematosus, unspecified: Secondary | ICD-10-CM | POA: Diagnosis not present

## 2016-03-04 DIAGNOSIS — J40 Bronchitis, not specified as acute or chronic: Secondary | ICD-10-CM

## 2016-03-04 DIAGNOSIS — J45901 Unspecified asthma with (acute) exacerbation: Secondary | ICD-10-CM | POA: Diagnosis not present

## 2016-03-04 DIAGNOSIS — Z7982 Long term (current) use of aspirin: Secondary | ICD-10-CM | POA: Insufficient documentation

## 2016-03-04 DIAGNOSIS — Z794 Long term (current) use of insulin: Secondary | ICD-10-CM | POA: Diagnosis not present

## 2016-03-04 DIAGNOSIS — Z7952 Long term (current) use of systemic steroids: Secondary | ICD-10-CM | POA: Diagnosis not present

## 2016-03-04 LAB — CBC
HCT: 34.9 % — ABNORMAL LOW (ref 36.0–46.0)
Hemoglobin: 11.3 g/dL — ABNORMAL LOW (ref 12.0–15.0)
MCH: 26.8 pg (ref 26.0–34.0)
MCHC: 32.4 g/dL (ref 30.0–36.0)
MCV: 82.9 fL (ref 78.0–100.0)
Platelets: 198 10*3/uL (ref 150–400)
RBC: 4.21 MIL/uL (ref 3.87–5.11)
RDW: 15.6 % — ABNORMAL HIGH (ref 11.5–15.5)
WBC: 11 10*3/uL — ABNORMAL HIGH (ref 4.0–10.5)

## 2016-03-04 LAB — BASIC METABOLIC PANEL
Anion gap: 9 (ref 5–15)
BUN: 18 mg/dL (ref 6–20)
CO2: 25 mmol/L (ref 22–32)
Calcium: 8.6 mg/dL — ABNORMAL LOW (ref 8.9–10.3)
Chloride: 105 mmol/L (ref 101–111)
Creatinine, Ser: 0.71 mg/dL (ref 0.44–1.00)
GFR calc Af Amer: >60 mL/min (ref >60)
GFR calc non Af Amer: >60 mL/min (ref >60)
Glucose, Bld: 98 mg/dL (ref 65–99)
Potassium: 3.1 mmol/L — ABNORMAL LOW (ref 3.5–5.1)
Sodium: 139 mmol/L (ref 135–145)

## 2016-03-04 LAB — URINALYSIS, ROUTINE W REFLEX MICROSCOPIC
Bilirubin Urine: NEGATIVE
Glucose, UA: NEGATIVE mg/dL
Hgb urine dipstick: NEGATIVE
Ketones, ur: NEGATIVE mg/dL
Leukocytes, UA: NEGATIVE
Nitrite: NEGATIVE
Protein, ur: NEGATIVE mg/dL
Specific Gravity, Urine: 1.021 (ref 1.005–1.030)
pH: 6 (ref 5.0–8.0)

## 2016-03-04 LAB — BRAIN NATRIURETIC PEPTIDE: B Natriuretic Peptide: 79.3 pg/mL (ref 0.0–100.0)

## 2016-03-04 LAB — I-STAT TROPONIN, ED: Troponin i, poc: 0 ng/mL (ref 0.00–0.08)

## 2016-03-04 MED ORDER — ALBUTEROL SULFATE 1.25 MG/3ML IN NEBU: 1.0000 | INHALATION_SOLUTION | Freq: Four times a day (QID) | RESPIRATORY_TRACT | Status: DC | PRN

## 2016-03-04 MED ORDER — AMOXICILLIN-POT CLAVULANATE 875-125 MG PO TABS: 1.0000 | ORAL_TABLET | Freq: Two times a day (BID) | ORAL | Status: DC

## 2016-03-04 MED ORDER — HYDROCOD POLST-CPM POLST ER 10-8 MG/5ML PO SUER
5.0000 mL | Freq: Once | ORAL | Status: AC
  Administered 2016-03-04: 5 mL via ORAL
  Filled 2016-03-04: qty 5

## 2016-03-04 MED ORDER — IPRATROPIUM BROMIDE 0.02 % IN SOLN
0.5000 mg | Freq: Once | RESPIRATORY_TRACT | Status: AC
  Administered 2016-03-04: 0.5 mg via RESPIRATORY_TRACT
  Filled 2016-03-04: qty 2.5

## 2016-03-04 MED ORDER — ONDANSETRON 8 MG PO TBDP
8.0000 mg | ORAL_TABLET | Freq: Once | ORAL | Status: AC
  Administered 2016-03-04: 8 mg via ORAL
  Filled 2016-03-04: qty 1

## 2016-03-04 MED ORDER — POTASSIUM CHLORIDE CRYS ER 20 MEQ PO TBCR
40.0000 meq | EXTENDED_RELEASE_TABLET | Freq: Once | ORAL | Status: AC
  Administered 2016-03-04: 40 meq via ORAL
  Filled 2016-03-04: qty 2

## 2016-03-04 MED ORDER — ALBUTEROL SULFATE (2.5 MG/3ML) 0.083% IN NEBU
5.0000 mg | INHALATION_SOLUTION | Freq: Once | RESPIRATORY_TRACT | Status: AC
  Administered 2016-03-04: 5 mg via RESPIRATORY_TRACT
  Filled 2016-03-04: qty 6

## 2016-03-04 MED ORDER — HYDROCOD POLST-CPM POLST ER 10-8 MG/5ML PO SUER: 5.0000 mL | Freq: Two times a day (BID) | ORAL | Status: DC | PRN

## 2016-03-04 NOTE — ED Notes (Signed)
PT CAN TAKE MORNING MEDS

## 2016-03-04 NOTE — ED Notes (Signed)
Patient transported to X-ray 

## 2016-03-04 NOTE — ED Notes (Signed)
DELAY IN DISCHARGE- PT REQUESTING ADDITIONAL RX FROM [facility]. ATTEMPTING TO LOCATE [facility]

## 2016-03-04 NOTE — Discharge Instructions (Signed)
Usual inhaler or nebulizer up to every 4 hours as needed for any trouble breathing. Return here for any problems Acute Bronchitis Bronchitis is inflammation of the airways that extend from the windpipe into the lungs (bronchi). The inflammation often causes mucus to develop. This leads to a cough, which is the most common symptom of bronchitis.  In acute bronchitis, the condition usually develops suddenly and goes away over time, usually in a couple weeks. Smoking, allergies, and asthma can make bronchitis worse. Repeated episodes of bronchitis may cause further lung problems.  CAUSES Acute bronchitis is most often caused by the same virus that causes a cold. The virus can spread from person to person (contagious) through coughing, sneezing, and touching contaminated objects. SIGNS AND SYMPTOMS   Cough.   Fever.   Coughing up mucus.   Body aches.   Chest congestion.   Chills.   Shortness of breath.   Sore throat.  DIAGNOSIS  Acute bronchitis is usually diagnosed through a physical exam. Your health care provider will also ask you questions about your medical history. Tests, such as chest X-rays, are sometimes done to rule out other conditions.  TREATMENT  Acute bronchitis usually goes away in a couple weeks. Oftentimes, no medical treatment is necessary. Medicines are sometimes given for relief of fever or cough. Antibiotic medicines are usually not needed but may be prescribed in certain situations. In some cases, an inhaler may be recommended to help reduce shortness of breath and control the cough. A cool mist vaporizer may also be used to help thin bronchial secretions and make it easier to clear the chest.  HOME CARE INSTRUCTIONS  Get plenty of rest.   Drink enough fluids to keep your urine clear or pale yellow (unless you have a medical condition that requires fluid restriction). Increasing fluids may help thin your respiratory secretions (sputum) and reduce chest  congestion, and it will prevent dehydration.   Take medicines only as directed by your health care provider.  If you were prescribed an antibiotic medicine, finish it all even if you start to feel better.  Avoid smoking and secondhand smoke. Exposure to cigarette smoke or irritating chemicals will make bronchitis worse. If you are a smoker, consider using nicotine gum or skin patches to help control withdrawal symptoms. Quitting smoking will help your lungs heal faster.   Reduce the chances of another bout of acute bronchitis by washing your hands frequently, avoiding people with cold symptoms, and trying not to touch your hands to your mouth, nose, or eyes.   Keep all follow-up visits as directed by your health care provider.  SEEK MEDICAL CARE IF: Your symptoms do not improve after 1 week of treatment.  SEEK IMMEDIATE MEDICAL CARE IF:  You develop an increased fever or chills.   You have chest pain.   You have severe shortness of breath.  You have bloody sputum.   You develop dehydration.  You faint or repeatedly feel like you are going to pass out.  You develop repeated vomiting.  You develop a severe headache. MAKE SURE YOU:   Understand these instructions.  Will watch your condition.  Will get help right away if you are not doing well or get worse.   This information is not intended to replace advice given to you by your health care provider. Make sure you discuss any questions you have with your health care provider.   Document Released: 01/16/2005 Document Revised: 12/30/2014 Document Reviewed: 06/01/2013 Elsevier Interactive Patient Education 2016 [employer] ° °

## 2016-03-04 NOTE — ED Notes (Signed)
Pt states chest pain, sob, cold like symptoms beginning Saturday morning worsening into today. Hx of CHF and lupus. Basilar lung sounds clear, +1 bilateral LE swelling, has gained ten pounds in the last week, states has been taking her lasix.

## 2016-03-04 NOTE — ED Provider Notes (Signed)
CSN: [id number]     Arrival date & time 03/04/16  0745 History   First MD Initiated Contact with Patient 03/04/16 0818     Chief Complaint  Patient presents with  . Chest Pain  . Shortness of Breath     (Consider location/radiation/quality/duration/timing/severity/associated sxs/prior Treatment) HPI Comments: Pt here w/ cough and congestion x 2 days--no fevers but dypsnea noted Chest discomfort noted when coughing Questionable urinary sx Pt notes sinus pressure, runny nose, and scratchy throat--no influenza vaccine Denies anginal chest pain or chf sx ( has h/o chf) Using otc meds w/o relief  Patient is a 45 y.o. female presenting with chest pain and shortness of breath. The history is provided by the patient.  Chest Pain Associated symptoms: shortness of breath   Shortness of Breath Associated symptoms: chest pain     Past Medical History  Diagnosis Date  . Lupus (systemic lupus erythematosus) ([place])   . Arthritis   . Hypertension   . Asthma   . Fibromyalgia   . CHF (congestive heart failure) ([place])   . 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 Hx of echocardiogram     Echo (9/15):  EF 50-55%, ant HK, Gr 1 DD, mild MR, mild LAE, no effusion  . Hx of cardiovascular stress test     ETT-Myoview (9/15):  No ischemia, EF 52%; NORMAL   Past Surgical History  Procedure Laterality Date  . Knee surgery    . Tubal ligation    . Abdominal hysterectomy  2009   Family History  Problem Relation Age of Onset  . Diabetes Other   . Hypertension Other   . Heart attack Father   . Heart attack Paternal Grandmother    Social History  Substance Use Topics  . Smoking status: Never Smoker   . Smokeless tobacco: Never Used  . Alcohol Use: No   OB History    No data available     Review of Systems  Respiratory: Positive for shortness of breath.   Cardiovascular: Positive for chest pain.  All other systems reviewed and are negative.     Allergies   Dilaudid; Erythromycin; Hydromorphone; Iodinated diagnostic agents; and Latex  Home Medications   Prior to Admission medications   Medication Sig Start Date End Date Taking? Authorizing Provider  ACTEMRA 162 MG/0.9ML SOSY Inject 162 mg into the skin every 14 (fourteen) days. 11/09/15   Historical Provider, MD  albuterol (PROVENTIL HFA;VENTOLIN HFA) 108 (90 Base) MCG/ACT inhaler Inhale 2 puffs into the lungs every 6 (six) hours as needed for wheezing or shortness of breath. 12/30/15   Robbie Lis, MD  albuterol (PROVENTIL) (2.5 MG/3ML) 0.083% nebulizer solution Take 3 mLs (2.5 mg total) by nebulization every 6 (six) hours as needed for wheezing or shortness of breath. 12/30/15   Robbie Lis, MD  aspirin 81 MG chewable tablet Chew 162 mg by mouth every morning.     Historical Provider, MD  carvedilol (COREG) 6.25 MG tablet Take 1 tablet (6.25 mg total) by mouth 2 (two) times daily with a meal. 01/04/16   Liliane Shi, PA-C  folic acid (FOLVITE) 1 MG tablet Take 1 tablet (1 mg total) by mouth every morning. 01/04/16   Liliane Shi, PA-C  furosemide (LASIX) 40 MG tablet Take 1 tablet (40 mg total) by mouth daily. 01/04/16   Liliane Shi, PA-C  guaiFENesin (ROBITUSSIN) 100 MG/5ML SOLN Take 5 mLs (100 mg total) by mouth every 4 (  four) hours as needed for cough or to loosen phlegm. 12/30/15   Robbie Lis, MD  hydrocortisone cream 1 % Apply 1 application topically 4 (four) times daily as needed for itching (rash).    Historical Provider, MD  hydroxychloroquine (PLAQUENIL) 200 MG tablet Take 1 tablet (200 mg total) by mouth every morning. 12/30/15   Robbie Lis, MD  ibuprofen (ADVIL,MOTRIN) 200 MG tablet Take 600 mg by mouth every 6 (six) hours as needed for headache or moderate pain.    Historical Provider, MD  insulin aspart (NOVOLOG FLEXPEN) 100 UNIT/ML FlexPen Inject 2-10 Units into the skin 3 (three) times daily as needed for high blood sugar. SLIDING SCALE 12/30/15   Robbie Lis, MD  insulin  detemir (LEVEMIR) 100 UNIT/ML injection Inject 0.15-0.3 mLs (15-30 Units total) into the skin 2 (two) times daily as needed (for high blood sugar). She takes 15 units in the morning and 30 units at bedtime. 12/30/15   Robbie Lis, MD  levocetirizine (XYZAL) 5 MG tablet Take 1 tablet (5 mg total) by mouth every morning. 12/30/15   Robbie Lis, MD  lisinopril (PRINIVIL,ZESTRIL) 20 MG tablet Take 1 tablet (20 mg total) by mouth daily. 12/30/15   Robbie Lis, MD  loratadine (CLARITIN) 10 MG tablet Take 1 tablet (10 mg total) by mouth daily. 12/30/15   Robbie Lis, MD  meloxicam (MOBIC) 15 MG tablet Take 1 tablet (15 mg total) by mouth daily as needed. Inflammation or pain 12/30/15   Robbie Lis, MD  methotrexate (RHEUMATREX) 2.5 MG tablet Take 20 mg by mouth once a week. Take 8 tablets by mouth once a week on Wednesday 02/06/15   Historical Provider, MD  Multiple Vitamin (MULTIVITAMIN WITH MINERALS) TABS Take 1 tablet by mouth every morning.     Historical Provider, MD  ondansetron (ZOFRAN) 4 MG tablet Take 1 tablet (4 mg total) by mouth every 8 (eight) hours as needed for nausea or vomiting. 12/30/15   Robbie Lis, MD  pantoprazole (PROTONIX) 20 MG tablet Take 1 tablet (20 mg total) by mouth daily. 12/30/15   Robbie Lis, MD  Phenylephrine-DM-GG (MUCINEX FAST-MAX CONGEST COUGH) 2.5-5-100 MG/5ML LIQD Take 1 Dose by mouth 3 (three) times daily as needed (cold symptoms).    Historical Provider, MD  potassium chloride SA (K-DUR,KLOR-CON) 20 MEQ tablet Take 2 tablets (40 mEq total) by mouth daily. 01/04/16   Liliane Shi, PA-C  predniSONE (DELTASONE) 20 MG tablet Take 1 tablet (20 mg total) by mouth daily with breakfast. 12/30/15   Robbie Lis, MD  torsemide (DEMADEX) 10 MG tablet Take 1 tablet (10 mg total) by mouth daily as needed (fluid). 01/04/16   Liliane Shi, PA-C  traZODone (DESYREL) 50 MG tablet Take 50 mg by mouth at bedtime as needed for sleep.  02/08/15   Historical Provider, MD   BP 147/89 mmHg   Pulse 110  Temp(Src) 97.6 F (36.4 C) (Oral)  Resp 28  SpO2 100% Physical Exam  Constitutional: She is oriented to person, place, and time. She appears well-developed and well-nourished.  Non-toxic appearance. No distress.  HENT:  Head: Normocephalic and atraumatic.  Eyes: Conjunctivae, EOM and lids are normal. Pupils are equal, round, and reactive to light.  Neck: Normal range of motion. Neck supple. No tracheal deviation present. No thyroid mass present.  Cardiovascular: Regular rhythm and normal heart sounds.  Tachycardia present.  Exam reveals no gallop.   No murmur heard. Pulmonary/Chest: Effort  normal. No stridor. No respiratory distress. She has decreased breath sounds. She has no wheezes. She has no rhonchi. She has no rales.  Abdominal: Soft. Normal appearance and bowel sounds are normal. She exhibits no distension. There is no tenderness. There is no rebound and no CVA tenderness.  Musculoskeletal: Normal range of motion. She exhibits no edema or tenderness.  Neurological: She is alert and oriented to person, place, and time. She has normal strength. No cranial nerve deficit or sensory deficit. GCS eye subscore is 4. GCS verbal subscore is 5. GCS motor subscore is 6.  Skin: Skin is warm and dry. No abrasion and no rash noted.  Psychiatric: She has a normal mood and affect. Her speech is normal and behavior is normal.  Nursing note and vitals reviewed.   ED Course  Procedures (including critical care time) Labs Review Labs Reviewed  BASIC METABOLIC PANEL  CBC  BRAIN NATRIURETIC PEPTIDE  I-STAT [place], ED    Imaging Review Dg Chest 2 View  03/04/2016  CLINICAL DATA:  Shortness of breath and chest pain for 2 days; progressive cough EXAM: CHEST  2 VIEW COMPARISON:  Chest radiograph December 28, 2015; chest CT December 29, 2015 FINDINGS: There is no edema or consolidation. The cardiac enlargement is stable. The pulmonary vascularity is normal. No adenopathy. No bone lesions.  IMPRESSION: Stable cardiac enlargement.  No edema or consolidation. Electronically Signed   By: Lowella Grip III M.D.   On: 03/04/2016 08:14   I have personally reviewed and evaluated these images and lab results as part of my medical decision-making.   EKG Interpretation   Date/Time:  Monday March 04 2016 07:53:59 EDT Ventricular Rate:  106 PR Interval:  186 QRS Duration: 83 QT Interval:  368 QTC Calculation: 489 R Axis:   47 Text Interpretation:  Sinus tachycardia Probable left atrial enlargement  Borderline T abnormalities, lateral leads Borderline prolonged QT interval  Confirmed by Zenia Resides  MD, ANTHONY ([id number]) on 03/04/2016 8:35:33 AM      MDM   Final diagnoses:  None    Patient given albuterol and Atrovent and feels better. No evidence of CHF or ACS. Likely viral illness but given her history of immunosuppression place on antibiotics and return precautions given    Lacretia Leigh, MD 03/04/16 1037

## 2016-03-04 NOTE — ED Notes (Signed)
MD at bedside. 

## 2016-03-26 ENCOUNTER — Encounter: Payer: Self-pay | Admitting: Cardiovascular Disease

## 2016-03-27 ENCOUNTER — Telehealth: Payer: Self-pay

## 2016-03-27 ENCOUNTER — Other Ambulatory Visit (INDEPENDENT_AMBULATORY_CARE_PROVIDER_SITE_OTHER): Payer: 59 | Admitting: *Deleted

## 2016-03-27 DIAGNOSIS — R300 Dysuria: Secondary | ICD-10-CM

## 2016-03-27 DIAGNOSIS — Z79899 Other long term (current) drug therapy: Secondary | ICD-10-CM

## 2016-03-27 LAB — BASIC METABOLIC PANEL
BUN: 16 mg/dL (ref 7–25)
CO2: 27 mmol/L (ref 20–31)
Calcium: 8.7 mg/dL (ref 8.6–10.2)
Chloride: 102 mmol/L (ref 98–110)
Creat: 0.85 mg/dL (ref 0.50–1.10)
Glucose, Bld: 88 mg/dL (ref 65–99)
Potassium: 3.4 mmol/L — ABNORMAL LOW (ref 3.5–5.3)
Sodium: 140 mmol/L (ref 135–146)

## 2016-03-27 LAB — BRAIN NATRIURETIC PEPTIDE: Brain Natriuretic Peptide: 99.6 pg/mL (ref <100)

## 2016-03-27 NOTE — Addendum Note (Signed)
Addended by: Eulis Foster on: 03/27/2016 08:42 AM   Modules accepted: Orders

## 2016-03-27 NOTE — Telephone Encounter (Signed)
Per Dr. Johnsie Cancel, have patient come in for BMET and BNP. Patient complaining of dysuria. Called patient about coming in for blood work. Patient will come this morning. Will order lab work and put patient on schedule for lab. Will leave a work note at front desk for patient, at her request.

## 2016-03-28 ENCOUNTER — Encounter: Payer: Self-pay | Admitting: Cardiovascular Disease

## 2016-03-28 ENCOUNTER — Emergency Department (HOSPITAL_COMMUNITY)
Admission: EM | Admit: 2016-03-28 | Discharge: 2016-03-28 | Disposition: A | Discharge status: Home / Self Care | Payer: 59 | Attending: Emergency Medicine | Admitting: Emergency Medicine

## 2016-03-28 ENCOUNTER — Encounter (HOSPITAL_COMMUNITY): Payer: Self-pay | Admitting: Emergency Medicine

## 2016-03-28 ENCOUNTER — Emergency Department (HOSPITAL_COMMUNITY): Payer: 59

## 2016-03-28 DIAGNOSIS — I10 Essential (primary) hypertension: Secondary | ICD-10-CM | POA: Insufficient documentation

## 2016-03-28 DIAGNOSIS — Z7952 Long term (current) use of systemic steroids: Secondary | ICD-10-CM | POA: Insufficient documentation

## 2016-03-28 DIAGNOSIS — M1990 Unspecified osteoarthritis, unspecified site: Secondary | ICD-10-CM | POA: Insufficient documentation

## 2016-03-28 DIAGNOSIS — Z794 Long term (current) use of insulin: Secondary | ICD-10-CM | POA: Insufficient documentation

## 2016-03-28 DIAGNOSIS — Z792 Long term (current) use of antibiotics: Secondary | ICD-10-CM | POA: Diagnosis not present

## 2016-03-28 DIAGNOSIS — Y9289 Other specified places as the place of occurrence of the external cause: Secondary | ICD-10-CM | POA: Insufficient documentation

## 2016-03-28 DIAGNOSIS — Z79899 Other long term (current) drug therapy: Secondary | ICD-10-CM | POA: Insufficient documentation

## 2016-03-28 DIAGNOSIS — S8262XA Displaced fracture of lateral malleolus of left fibula, initial encounter for closed fracture: Secondary | ICD-10-CM | POA: Insufficient documentation

## 2016-03-28 DIAGNOSIS — Z7982 Long term (current) use of aspirin: Secondary | ICD-10-CM | POA: Insufficient documentation

## 2016-03-28 DIAGNOSIS — I509 Heart failure, unspecified: Secondary | ICD-10-CM | POA: Diagnosis not present

## 2016-03-28 DIAGNOSIS — Z91040 Latex allergy status: Secondary | ICD-10-CM | POA: Diagnosis not present

## 2016-03-28 DIAGNOSIS — J45909 Unspecified asthma, uncomplicated: Secondary | ICD-10-CM | POA: Diagnosis not present

## 2016-03-28 DIAGNOSIS — W010XXA Fall on same level from slipping, tripping and stumbling without subsequent striking against object, initial encounter: Secondary | ICD-10-CM | POA: Diagnosis not present

## 2016-03-28 DIAGNOSIS — Y9389 Activity, other specified: Secondary | ICD-10-CM | POA: Insufficient documentation

## 2016-03-28 DIAGNOSIS — M329 Systemic lupus erythematosus, unspecified: Secondary | ICD-10-CM | POA: Diagnosis not present

## 2016-03-28 DIAGNOSIS — E119 Type 2 diabetes mellitus without complications: Secondary | ICD-10-CM | POA: Insufficient documentation

## 2016-03-28 DIAGNOSIS — S82892A Other fracture of left lower leg, initial encounter for closed fracture: Secondary | ICD-10-CM

## 2016-03-28 DIAGNOSIS — S99912A Unspecified injury of left ankle, initial encounter: Secondary | ICD-10-CM | POA: Diagnosis present

## 2016-03-28 DIAGNOSIS — Y998 Other external cause status: Secondary | ICD-10-CM | POA: Insufficient documentation

## 2016-03-28 MED ORDER — OXYCODONE-ACETAMINOPHEN 5-325 MG PO TABS
1.0000 | ORAL_TABLET | Freq: Once | ORAL | Status: AC
  Administered 2016-03-28: 1 via ORAL
  Filled 2016-03-28: qty 1

## 2016-03-28 MED ORDER — OXYCODONE-ACETAMINOPHEN 5-325 MG PO TABS: 1.0000 | ORAL_TABLET | ORAL | Status: DC | PRN

## 2016-03-28 NOTE — Discharge Instructions (Signed)
Ibuprofen or tylenol for pain. Percocet for severe pain. Follow up with orthopedics. Keep knee elevated at home. Ice several times a day. Use crutches for ambulation.   Ankle Fracture A fracture is a break in a bone. The ankle joint is made up of three bones. These include the lower (distal)sections of your lower leg bones, called the tibia and fibula, along with a bone in your foot, called the talus. Depending on how bad the break is and if more than one ankle joint bone is broken, a cast or splint is used to protect and keep your injured bone from moving while it heals. Sometimes, surgery is required to help the fracture heal properly.  There are two general types of fractures:  Stable fracture. This includes a single fracture line through one bone, with no injury to ankle ligaments. A fracture of the talus that does not have any displacement (movement of the bone on either side of the fracture line) is also stable.  Unstable fracture. This includes more than one fracture line through one or more bones in the ankle joint. It also includes fractures that have displacement of the bone on either side of the fracture line. CAUSES  A direct blow to the ankle.   Quickly and severely twisting your ankle.  Trauma, such as a car accident or falling from a significant height. RISK FACTORS You may be at a higher risk of ankle fracture if:  You have certain medical conditions.  You are involved in high-impact sports.  You are involved in a high-impact car accident. SIGNS AND SYMPTOMS   Tender and swollen ankle.  Bruising around the injured ankle.  Pain on movement of the ankle.  Difficulty walking or putting weight on the ankle.  A cold foot below the site of the ankle injury. This can occur if the blood vessels passing through your injured ankle were also damaged.  Numbness in the foot below the site of the ankle injury. DIAGNOSIS  An ankle fracture is usually diagnosed with a physical  exam and X-rays. A CT scan may also be required for complex fractures. TREATMENT  Stable fractures are treated with a cast or splint and using crutches to avoid putting weight on your injured ankle. This is followed by an ankle strengthening program. Some patients require a special type of cast, depending on other medical problems they may have. Unstable fractures require surgery to ensure the bones heal properly. Your health care provider will tell you what type of fracture you have and the best treatment for your condition. HOME CARE INSTRUCTIONS   Review correct crutch use with your health care provider and use your crutches as directed. Safe use of crutches is extremely important. Misuse of crutches can cause you to fall or cause injury to nerves in your hands or armpits.  Do not put weight or pressure on the injured ankle until directed by your health care provider.  To lessen the swelling, keep the injured leg elevated while sitting or lying down.  Apply ice to the injured area:  Put ice in a plastic bag.  Place a towel between your cast and the bag.  Leave the ice on for 20 minutes, 2-3 times a day.  If you have a plaster or fiberglass cast:  Do not try to scratch the skin under the cast with any objects. This can increase your risk of skin infection.  Check the skin around the cast every day. You may put lotion on any red  or sore areas.  Keep your cast dry and clean.  If you have a plaster splint:  Wear the splint as directed.  You may loosen the elastic around the splint if your toes become numb, tingle, or turn cold or blue.  Do not put pressure on any part of your cast or splint; it may break. Rest your cast only on a pillow the first 24 hours until it is fully hardened.  Your cast or splint can be protected during bathing with a plastic bag sealed to your skin with medical tape. Do not lower the cast or splint into water.  Take medicines as directed by your health  care provider. Only take over-the-counter or prescription medicines for pain, discomfort, or fever as directed by your health care provider.  Do not drive a vehicle until your health care provider specifically tells you it is safe to do so.  If your health care provider has given you a follow-up appointment, it is very important to keep that appointment. Not keeping the appointment could result in a chronic or permanent injury, pain, and disability. If you have any problem keeping the appointment, call the facility for assistance. SEEK MEDICAL CARE IF: You develop increased swelling or discomfort. SEEK IMMEDIATE MEDICAL CARE IF:   Your cast gets damaged or breaks.  You have continued severe pain.  You develop new pain or swelling after the cast was put on.  Your skin or toenails below the injury turn blue or gray.  Your skin or toenails below the injury feel cold, numb, or have loss of sensitivity to touch.  There is a bad smell or pus draining from under the cast. MAKE SURE YOU:   Understand these instructions.  Will watch your condition.  Will get help right away if you are not doing well or get worse.   This information is not intended to replace advice given to you by your health care provider. Make sure you discuss any questions you have with your health care provider.   Document Released: 12/06/2000 Document Revised: 12/14/2013 Document Reviewed: 07/08/2013 Elsevier Interactive Patient Education [employer].

## 2016-03-28 NOTE — ED Notes (Signed)
Pt reports fall, twisted left ankle,st sheard pop. Unable to put any weight on left foot per pt. Painful ROM left ankle.

## 2016-03-28 NOTE — ED Provider Notes (Signed)
CSN: [id number]     Arrival date & time 03/28/16  1431 History  By signing my name below, I, Christine Mcdowell, attest that this documentation has been prepared under the direction and in the presence of non-physician practitioner, Jeannett Senior, PA-C. Electronically Signed: Evelene Mcdowell, Scribe. 03/28/2016. 3:33 PM.   Chief Complaint  Patient presents with  . Ankle Injury    left    The history is provided by the patient. No language interpreter was used.     HPI Comments:  Christine Mcdowell is a 45 y.o. female who presents to the Emergency Department complaining of left ankle pain s/p fall this afternoon. Pt states she slipped on wet grass, fell and twisted the ankle outward. Pt notes immediate pain afterward and states she has been unable to bear weight on the extremity. She reports associated swelling to the site.  She denies head injury and LOC. Pt has no other injuries. No alleviating factors noted. She denies h/o injury to the left ankle.    Past Medical History  Diagnosis Date  . Lupus (systemic lupus erythematosus) ([place])   . Arthritis   . Hypertension   . Asthma   . Fibromyalgia   . CHF (congestive heart failure) ([place])   . 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 Hx of echocardiogram     Echo (9/15):  EF 50-55%, ant HK, Gr 1 DD, mild MR, mild LAE, no effusion  . Hx of cardiovascular stress test     ETT-Myoview (9/15):  No ischemia, EF 52%; NORMAL   Past Surgical History  Procedure Laterality Date  . Knee surgery    . Tubal ligation    . Abdominal hysterectomy  2009   Family History  Problem Relation Age of Onset  . Diabetes Other   . Hypertension Other   . Heart attack Father   . Heart attack Paternal Grandmother    Social History  Substance Use Topics  . Smoking status: Never Smoker   . Smokeless tobacco: Never Used  . Alcohol Use: No   OB History    No data available     Review of Systems  Musculoskeletal: Positive for  myalgias, joint swelling and arthralgias.  Neurological: Negative for weakness and numbness.   Allergies  Dilaudid; Erythromycin; Hydromorphone; Iodinated diagnostic agents; and Latex  Home Medications   Prior to Admission medications   Medication Sig Start Date End Date Taking? Authorizing Provider  ACTEMRA 162 MG/0.9ML SOSY Inject 162 mg into the skin every 14 (fourteen) days. 11/09/15   Historical Provider, MD  albuterol (ACCUNEB) 1.25 MG/3ML nebulizer solution Take 3 mLs (1.25 mg total) by nebulization every 6 (six) hours as needed for wheezing. 03/04/16   Lacretia Leigh, MD  amoxicillin-clavulanate (AUGMENTIN) 875-125 MG tablet Take 1 tablet by mouth 2 (two) times daily. 03/04/16   Lacretia Leigh, MD  aspirin 81 MG chewable tablet Chew 162 mg by mouth every morning.     Historical Provider, MD  carvedilol (COREG) 6.25 MG tablet Take 1 tablet (6.25 mg total) by mouth 2 (two) times daily with a meal. 01/04/16   Liliane Shi, PA-C  chlorpheniramine-HYDROcodone (TUSSIONEX PENNKINETIC ER) 10-8 MG/5ML SUER Take 5 mLs by mouth every 12 (twelve) hours as needed for cough. 03/04/16   Lacretia Leigh, MD  folic acid (FOLVITE) 1 MG tablet Take 1 tablet (1 mg total) by mouth every morning. 01/04/16   Liliane Shi, PA-C  furosemide (LASIX) 40 MG tablet Take 1  tablet (40 mg total) by mouth daily. 01/04/16   Liliane Shi, PA-C  guaiFENesin (ROBITUSSIN) 100 MG/5ML SOLN Take 5 mLs (100 mg total) by mouth every 4 (four) hours as needed for cough or to loosen phlegm. 12/30/15   Robbie Lis, MD  HUMALOG KWIKPEN 100 UNIT/ML KiwkPen Inject 0-20 Units into the skin 3 (three) times daily as needed (for blood sugar). Sliding scale 02/01/16   Historical Provider, MD  hydrocortisone cream 1 % Apply 1 application topically 4 (four) times daily as needed for itching (rash).    Historical Provider, MD  hydroxychloroquine (PLAQUENIL) 200 MG tablet Take 1 tablet (200 mg total) by mouth every morning. 12/30/15   Robbie Lis, MD   ibuprofen (ADVIL,MOTRIN) 200 MG tablet Take 600 mg by mouth every 6 (six) hours as needed for headache or moderate pain.    Historical Provider, MD  insulin aspart (NOVOLOG FLEXPEN) 100 UNIT/ML FlexPen Inject 2-10 Units into the skin 3 (three) times daily as needed for high blood sugar. SLIDING SCALE Patient not taking: Reported on 03/04/2016 12/30/15   Robbie Lis, MD  insulin detemir (LEVEMIR) 100 UNIT/ML injection Inject 0.15-0.3 mLs (15-30 Units total) into the skin 2 (two) times daily as needed (for high blood sugar). She takes 15 units in the morning and 30 units at bedtime. 12/30/15   Robbie Lis, MD  levocetirizine (XYZAL) 5 MG tablet Take 1 tablet (5 mg total) by mouth every morning. 12/30/15   Robbie Lis, MD  lisinopril (PRINIVIL,ZESTRIL) 20 MG tablet Take 1 tablet (20 mg total) by mouth daily. 12/30/15   Robbie Lis, MD  loratadine (CLARITIN) 10 MG tablet Take 1 tablet (10 mg total) by mouth daily. 12/30/15   Robbie Lis, MD  meloxicam (MOBIC) 15 MG tablet Take 1 tablet (15 mg total) by mouth daily as needed. Inflammation or pain Patient taking differently: Take 15 mg by mouth daily. Inflammation or pain 12/30/15   Robbie Lis, MD  Menthol (COUGH DROPS) 10 MG LOZG Use as directed 1 lozenge in the mouth or throat as needed (for cough).    Historical Provider, MD  methotrexate (RHEUMATREX) 2.5 MG tablet Take 20 mg by mouth once a week. Take 8 tablets by mouth once a week on Wednesday 02/06/15   Historical Provider, MD  Multiple Vitamin (MULTIVITAMIN WITH MINERALS) TABS Take 1 tablet by mouth every morning.     Historical Provider, MD  ondansetron (ZOFRAN) 4 MG tablet Take 1 tablet (4 mg total) by mouth every 8 (eight) hours as needed for nausea or vomiting. 12/30/15   Robbie Lis, MD  pantoprazole (PROTONIX) 20 MG tablet Take 1 tablet (20 mg total) by mouth daily. Patient not taking: Reported on 03/04/2016 12/30/15   Robbie Lis, MD  Phenyleph-Doxylamine-DM-APAP (ALKA SELTZER PLUS PO) Take 2  tablets by mouth every 4 (four) hours as needed (for cold).    Historical Provider, MD  Phenylephrine-DM-GG (MUCINEX FAST-MAX CONGEST COUGH) 2.5-5-100 MG/5ML LIQD Take 1 Dose by mouth 3 (three) times daily as needed (cold symptoms).    Historical Provider, MD  potassium chloride SA (K-DUR,KLOR-CON) 20 MEQ tablet Take 2 tablets (40 mEq total) by mouth daily. Patient taking differently: Take 20-40 mEq by mouth 2 (two) times daily. Takes 41mEq in the morning and 21mEq in the evening 01/04/16   Liliane Shi, PA-C  predniSONE (DELTASONE) 20 MG tablet Take 1 tablet (20 mg total) by mouth daily with breakfast. 12/30/15   Alma  Vivi Ferns, MD  torsemide (DEMADEX) 10 MG tablet Take 1 tablet (10 mg total) by mouth daily as needed (fluid). 01/04/16   Liliane Shi, PA-C  traZODone (DESYREL) 50 MG tablet Take 50 mg by mouth at bedtime as needed for sleep.  02/08/15   Historical Provider, MD   BP 136/86 mmHg  Pulse 93  Temp(Src) 98.5 F (36.9 C) (Oral)  Resp 16  SpO2 99% Physical Exam  Constitutional: She is oriented to person, place, and time. She appears well-developed and well-nourished. No distress.  HENT:  Head: Normocephalic and atraumatic.  Eyes: Conjunctivae are normal.  Cardiovascular: Normal rate.   Pulmonary/Chest: Effort normal.  Abdominal: She exhibits no distension.  Musculoskeletal:  Swelling noted to the left ankle over medial and lateral malleoli. Normal knee exam. Achilles tendon is intact. Tender to palpation over medial and lateral malleoli. Pain with any range of motion of the ankle. Patient is refusing to move ankle due to pain. No tenderness over the foot. Patient is able to wiggle her toes. Dorsal pedal pulse intact  Neurological: She is alert and oriented to person, place, and time.  Skin: Skin is warm and dry.  Psychiatric: She has a normal mood and affect.  Nursing note and vitals reviewed.   ED Course  Procedures   DIAGNOSTIC STUDIES:  Oxygen Saturation is 100% on RA,  normal by my interpretation.    COORDINATION OF CARE:  3:31 PM Discussed treatment plan with pt at bedside and pt agreed to plan.  Imaging Review Dg Ankle Complete Left  03/28/2016  CLINICAL DATA:  Acute left ankle pain after slipping injury today. Initial encounter. EXAM: LEFT ANKLE COMPLETE - 3+ VIEW COMPARISON:  June 30, 2011. FINDINGS: There is seen lucency involving the posterior malleolus of the distal tibia on the lateral projection only. While this may represent artifact, the possibility of minimally displaced fracture cannot be excluded. There is no evidence of arthropathy. Stable sclerotic lesion is seen in distal fibula. Soft tissue swelling is seen over lateral malleolus suggesting ligamentous injury. IMPRESSION: Soft tissue swelling seen over lateral malleolus suggesting ligamentous injury. Lucency seen involving posterior malleolus of distal tibia on lateral projection only which may represent artifact, but the possibility of minimally displaced fracture cannot be excluded. Clinical correlation is recommended to determine if the patient is symptomatic in this area. CT scan may be performed for further evaluation. Electronically Signed   By: Marijo Conception, M.D.   On: 03/28/2016 15:13   I have personally reviewed and evaluated these images as part of my medical decision-making.   MDM  Patient X-Ray negative for obvious fracture or dislocation.  Pt advised to follow up with orthopedics. Patient given splint while in ED, conservative therapy recommended and discussed. Patient will be discharged home & is agreeable with above plan. Returns precautions discussed. Pt appears safe for discharge.  Final diagnoses:  Ankle fracture, left, closed, initial encounter    Patient emergency department with ankle injury as described above. X-ray showing possible fracture. I reviewed x-rays myself, and it does appear that patient may have a fracture in the ankle. Discussed with Dr. Venora Maples who  recommended splinting and following up with orthopedics. Will discharge home with Percocet, splint, patient has her own car chest, ice, elevation, follow up as soon as able.  Filed Vitals:   03/28/16 1443  BP: 136/86  Pulse: 93  Temp: 98.5 F (36.9 C)  TempSrc: Oral  Resp: 16  SpO2: 99%     Christine  Kirichenko, PA-C 03/28/16 [place], MD 03/28/16 [phone]

## 2016-03-29 ENCOUNTER — Telehealth: Payer: Self-pay | Admitting: Cardiovascular Disease

## 2016-03-29 DIAGNOSIS — Z885 Allergy status to narcotic agent status: Secondary | ICD-10-CM | POA: Diagnosis not present

## 2016-03-29 DIAGNOSIS — I110 Hypertensive heart disease with heart failure: Secondary | ICD-10-CM | POA: Diagnosis not present

## 2016-03-29 DIAGNOSIS — Z7982 Long term (current) use of aspirin: Secondary | ICD-10-CM | POA: Diagnosis not present

## 2016-03-29 DIAGNOSIS — Z794 Long term (current) use of insulin: Secondary | ICD-10-CM | POA: Diagnosis not present

## 2016-03-29 DIAGNOSIS — I420 Dilated cardiomyopathy: Secondary | ICD-10-CM | POA: Diagnosis not present

## 2016-03-29 DIAGNOSIS — Z881 Allergy status to other antibiotic agents status: Secondary | ICD-10-CM | POA: Diagnosis not present

## 2016-03-29 DIAGNOSIS — E119 Type 2 diabetes mellitus without complications: Secondary | ICD-10-CM | POA: Diagnosis not present

## 2016-03-29 DIAGNOSIS — S82892A Other fracture of left lower leg, initial encounter for closed fracture: Secondary | ICD-10-CM | POA: Diagnosis not present

## 2016-03-29 DIAGNOSIS — X58XXXA Exposure to other specified factors, initial encounter: Secondary | ICD-10-CM | POA: Diagnosis not present

## 2016-03-29 DIAGNOSIS — Z79899 Other long term (current) drug therapy: Secondary | ICD-10-CM | POA: Diagnosis not present

## 2016-03-29 NOTE — Telephone Encounter (Signed)
Follow Up  ° °Pt returned the call  °

## 2016-03-29 NOTE — Telephone Encounter (Signed)
Left message to call back  

## 2016-03-29 NOTE — Telephone Encounter (Signed)
Pt is calling you back 

## 2016-03-29 NOTE — Telephone Encounter (Signed)
Talked to patient about her lab results. Patient verbalized understanding.

## 2016-04-09 ENCOUNTER — Institutional Professional Consult (permissible substitution): Payer: 59 | Admitting: Internal Medicine

## 2016-04-10 NOTE — Progress Notes (Signed)
Patient ID: Christine Mcdowell, female   DOB: [birth year], 45 y.o.   MRN: [id number]    Cardiology Office Note    Date:  04/10/2016   ID:  Christine Mcdowell, DOB 09-May-1971, MRN [id number]  PCP:  Christine Greenland, MD  Cardiologist:  Christine Mcdowell   Electrophysiologist:  n/a  Dyspnea   History of Present Illness:  Christine Mcdowell is a 45 y.o. female with a hx of advanced lupus with associated myocarditis/cardiomyopathy (dx in 01/2014) with EF 40-45%, HTN, DM, DJD, fibromyalgia, asthma. Last seen by me  in 7/16.  Event monitor for palpitations demonstrated no arrhythmias.    Admitted 1/3-1/7 with acute on chronic combined systolic and diastolic CHF in the setting of community acquired pneumonia. She was diuresed with IV Lasix and treated with antibiotics.  Echocardiogram demonstrated EF 35-40%.  Serologic studies apparently did not suggest active lupus. She had been off of her medication secondary to finances. She was placed back on steroids.  Returns for follow-up.  She is doing fairly well. She denies chest pain, syncope.  She sleeps on an incline chronically. She notes mild pedal edema without change.  Her weight has increased since DC by 4 lbs.  She notes improved cough.  She still has a lot of upper airway congestion and sees her PCP  to discuss this further.    Issues with palpitations ? PaC;s  Some PND/orthopnea.  Some edema  Alopecia with chemo for lupus    Event monitor :  Reviewed  Benign infrequent PVC;s    Calls office a lot with "swelling" and edema but BNP and BMET usually normal  BNP (last 3 results)  Recent Labs  12/26/15 1132 12/28/15 0505 03/04/16 0830  BNP 533.5* 150.1* 79.3    ProBNP (last 3 results) No results for input(s): PROBNP in the last 8760 hours.  Lab Results  Component Value Date   CREATININE 0.85 03/27/2016   BUN 16 03/27/2016   NA 140 03/27/2016   K 3.4* 03/27/2016   CL 102 03/27/2016   CO2 27 03/27/2016      Past Medical History    Diagnosis Date  . Lupus (systemic lupus erythematosus) ([place])   . Arthritis   . Hypertension   . Asthma   . Fibromyalgia   . CHF (congestive heart failure) ([place])   . 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 Hx of echocardiogram     Echo (9/15):  EF 50-55%, ant HK, Gr 1 DD, mild MR, mild LAE, no effusion  . Hx of cardiovascular stress test     ETT-Myoview (9/15):  No ischemia, EF 52%; NORMAL    Past Surgical History  Procedure Laterality Date  . Knee surgery    . Tubal ligation    . Abdominal hysterectomy  2009    Current Medications: Previous Medications   ACTEMRA 162 MG/0.9ML SOSY    Inject 162 mg into the skin every 14 (fourteen) days.   ALBUTEROL (ACCUNEB) 1.25 MG/3ML NEBULIZER SOLUTION    Take 3 mLs (1.25 mg total) by nebulization every 6 (six) hours as needed for wheezing.   AMOXICILLIN-CLAVULANATE (AUGMENTIN) 875-125 MG TABLET    Take 1 tablet by mouth 2 (two) times daily.   ASPIRIN 81 MG CHEWABLE TABLET    Chew 162 mg by mouth every morning.    CARVEDILOL (COREG) 6.25 MG TABLET    Take 1 tablet (6.25 mg total) by mouth 2 (two) times daily with a meal.   CHLORPHENIRAMINE-HYDROCODONE (  TUSSIONEX PENNKINETIC ER) 10-8 MG/5ML SUER    Take 5 mLs by mouth every 12 (twelve) hours as needed for cough.   FOLIC ACID (FOLVITE) 1 MG TABLET    Take 1 tablet (1 mg total) by mouth every morning.   FUROSEMIDE (LASIX) 40 MG TABLET    Take 1 tablet (40 mg total) by mouth daily.   GUAIFENESIN (ROBITUSSIN) 100 MG/5ML SOLN    Take 5 mLs (100 mg total) by mouth every 4 (four) hours as needed for cough or to loosen phlegm.   HUMALOG KWIKPEN 100 UNIT/ML KIWKPEN    Inject 0-20 Units into the skin 3 (three) times daily as needed (for blood sugar). Sliding scale   HYDROCORTISONE CREAM 1 %    Apply 1 application topically 4 (four) times daily as needed for itching (rash).   HYDROXYCHLOROQUINE (PLAQUENIL) 200 MG TABLET    Take 1 tablet (200 mg total) by mouth every morning.    IBUPROFEN (ADVIL,MOTRIN) 200 MG TABLET    Take 600 mg by mouth every 6 (six) hours as needed for headache or moderate pain.   INSULIN ASPART (NOVOLOG FLEXPEN) 100 UNIT/ML FLEXPEN    Inject 2-10 Units into the skin 3 (three) times daily as needed for high blood sugar. SLIDING SCALE   INSULIN DETEMIR (LEVEMIR) 100 UNIT/ML INJECTION    Inject 0.15-0.3 mLs (15-30 Units total) into the skin 2 (two) times daily as needed (for high blood sugar). She takes 15 units in the morning and 30 units at bedtime.   LEVOCETIRIZINE (XYZAL) 5 MG TABLET    Take 1 tablet (5 mg total) by mouth every morning.   LISINOPRIL (PRINIVIL,ZESTRIL) 20 MG TABLET    Take 1 tablet (20 mg total) by mouth daily.   LORATADINE (CLARITIN) 10 MG TABLET    Take 1 tablet (10 mg total) by mouth daily.   MELOXICAM (MOBIC) 15 MG TABLET    Take 1 tablet (15 mg total) by mouth daily as needed. Inflammation or pain   MENTHOL (COUGH DROPS) 10 MG LOZG    Use as directed 1 lozenge in the mouth or throat as needed (for cough).   METHOTREXATE (RHEUMATREX) 2.5 MG TABLET    Take 20 mg by mouth once a week. Take 8 tablets by mouth once a week on Wednesday   MULTIPLE VITAMIN (MULTIVITAMIN WITH MINERALS) TABS    Take 1 tablet by mouth every morning.    ONDANSETRON (ZOFRAN) 4 MG TABLET    Take 1 tablet (4 mg total) by mouth every 8 (eight) hours as needed for nausea or vomiting.   OXYCODONE-ACETAMINOPHEN (PERCOCET) 5-325 MG TABLET    Take 1 tablet by mouth every 4 (four) hours as needed for severe pain.   PANTOPRAZOLE (PROTONIX) 20 MG TABLET    Take 1 tablet (20 mg total) by mouth daily.   PHENYLEPH-DOXYLAMINE-DM-APAP (ALKA SELTZER PLUS PO)    Take 2 tablets by mouth every 4 (four) hours as needed (for cold).   PHENYLEPHRINE-DM-GG (MUCINEX FAST-MAX CONGEST COUGH) 2.5-5-100 MG/5ML LIQD    Take 1 Dose by mouth 3 (three) times daily as needed (cold symptoms).   POTASSIUM CHLORIDE SA (K-DUR,KLOR-CON) 20 MEQ TABLET    Take 2 tablets (40 mEq total) by mouth daily.     PREDNISONE (DELTASONE) 20 MG TABLET    Take 1 tablet (20 mg total) by mouth daily with breakfast.   TORSEMIDE (DEMADEX) 10 MG TABLET    Take 1 tablet (10 mg total) by mouth daily as needed (fluid).  TRAZODONE (DESYREL) 50 MG TABLET    Take 50 mg by mouth at bedtime as needed for sleep.      Allergies:   Dilaudid; Erythromycin; Hydromorphone; Iodinated diagnostic agents; and Latex   Social History   Social History  . Marital Status: Divorced    Spouse Name: N/A  . Number of Children: N/A  . Years of Education: N/A   Social History Main Topics  . Smoking status: Never Smoker   . Smokeless tobacco: Never Used  . Alcohol Use: No  . Drug Use: No  . Sexual Activity: Not on file   Other Topics Concern  . Not on file   Social History Narrative     Family History:  The patient's family history includes Diabetes in her other; Heart attack in her father and paternal grandmother; Hypertension in her other.   ROS:   Please see the history of present illness.    Review of Systems  HENT: Positive for headaches.   Cardiovascular: Positive for dyspnea on exertion and leg swelling.  Respiratory: Positive for cough and shortness of breath.   Skin: Positive for rash.  Musculoskeletal: Positive for joint pain, joint swelling and myalgias.  All other systems reviewed and are negative.   PHYSICAL EXAM:   VS:  There were no vitals taken for this visit.   GEN: cushingoid black female  HEENT: alopecia with hair piece on  Neck: no JVD, no masses Cardiac: Normal S1/S2, RRR; no murmurs, rubs, or gallops, no edema;     Respiratory:  clear to auscultation bilaterally; no wheezing, rhonchi or rales GI: soft, nontender, nondistended MS: no deformity or atrophy Skin: warm and dry, no rash Neuro:   no focal deficits  Psych: Alert and oriented x 3, normal affect Trace LE edema bilateral   Wt Readings from Last 3 Encounters:  01/22/16 79.379 kg (175 lb)  01/04/16 80.196 kg (176 lb 12.8 oz)   12/30/15 78.2 kg (172 lb 6.4 oz)      Studies/Labs Reviewed:   EKG:   12/28/15  SR rate 88  LVH nonspecific ST/T wave changes   Recent Labs: 11/18/2015: ALT 14 12/29/2015: Magnesium 2.2 03/04/2016: B Natriuretic Peptide 79.3; Hemoglobin 11.3*; Platelets 198 03/27/2016: BUN 16; Creat 0.85; Potassium 3.4*; Sodium 140   Recent Lipid Panel No results found for: CHOL, TRIG, HDL, CHOLHDL, VLDL, LDLCALC, LDLDIRECT  Additional studies/ records that were reviewed today include:   Echo 12/27/15 Mild LVH, EF 35-40%, diff HK, normal diastolic function, mild MR, trivial pericardial effusion  Event Monitor 08/17/15 NSR No arrhythmias No correlation with palpitations or chest pain   Cardiac MRI 03/02/15:  IMPRESSION: 1) Moderate LVE diffuse hypokinesis EF 46% Similar to MRI done 01/28/14 2) No delayed gadolinium uptake or evidence of myocarditis 3) Normal RA/RV/LA 4) Trivial posterior pericardial effusion  Myoview 9/15 No ischemia, EF 52%, normal  Echo (3/15):  Mild LVH, EF 35-40%, diff HK, mild MR.   ASSESSMENT:    No diagnosis found.  PLAN:  In order of problems listed above:  1. Chronic combined systolic and diastolic HF -  Demedex PRN  In addition to lasix   2. Dilated cardiomyopathy - MRI in 2016 with EF 46%. Recent echocardiogram with EF 35-40%. This was felt to be close to her baseline. Continue beta blocker, ACE inhibitor.  3. Lupus - Follow-Up with rheumatology as planned.  4. HTN - Controlled. Continue current therapy. Take additional lisinopril as needed   5. Cough - She continues to have significant  congestion. She was treated for pneumonia in the hospital and there was consolidation on her chest x-ray. She currently has a clear chest exam. F/U primary Further recommendations per primary care.  6. Palpitations 21 day event monitor benign with infrequent PVC;s no other arrhythmias or PAF    Medication Adjustments/Labs and Tests Ordered: Current medicines are reviewed  at length with the patient today.  Concerns regarding medicines are outlined above.  Medication changes, Labs and Tests ordered today are outlined in the Patient Instructions noted below.    Signed, Christine Rouge, MD  04/10/2016 8:25 AM    [facility] [address], [place], [place]  [address] Phone: [phone]; Fax: [phone]   There are no Patient Instructions on file for this visit. This encounter was created in error - please disregard.

## 2016-04-11 ENCOUNTER — Encounter: Payer: 59 | Admitting: Cardiovascular Disease

## 2016-04-11 DIAGNOSIS — M358 Other specified systemic involvement of connective tissue: Secondary | ICD-10-CM | POA: Diagnosis not present

## 2016-04-11 DIAGNOSIS — M329 Systemic lupus erythematosus, unspecified: Secondary | ICD-10-CM | POA: Diagnosis not present

## 2016-04-11 DIAGNOSIS — M0579 Rheumatoid arthritis with rheumatoid factor of multiple sites without organ or systems involvement: Secondary | ICD-10-CM | POA: Diagnosis not present

## 2016-04-11 DIAGNOSIS — J310 Chronic rhinitis: Secondary | ICD-10-CM | POA: Diagnosis not present

## 2016-04-11 DIAGNOSIS — S82892A Other fracture of left lower leg, initial encounter for closed fracture: Secondary | ICD-10-CM | POA: Diagnosis not present

## 2016-04-15 ENCOUNTER — Institutional Professional Consult (permissible substitution): Payer: 59 | Admitting: Pulmonary Disease

## 2016-04-16 ENCOUNTER — Encounter: Payer: Self-pay | Admitting: Cardiovascular Disease

## 2016-05-02 ENCOUNTER — Ambulatory Visit: Payer: 59 | Admitting: Cardiovascular Disease

## 2016-05-03 ENCOUNTER — Encounter: Payer: Self-pay | Admitting: Cardiovascular Disease

## 2016-05-06 DIAGNOSIS — I10 Essential (primary) hypertension: Secondary | ICD-10-CM | POA: Diagnosis not present

## 2016-05-06 DIAGNOSIS — N760 Acute vaginitis: Secondary | ICD-10-CM | POA: Diagnosis not present

## 2016-05-06 DIAGNOSIS — S82392S Other fracture of lower end of left tibia, sequela: Secondary | ICD-10-CM | POA: Diagnosis not present

## 2016-05-14 DIAGNOSIS — J310 Chronic rhinitis: Secondary | ICD-10-CM | POA: Diagnosis not present

## 2016-05-14 DIAGNOSIS — M358 Other specified systemic involvement of connective tissue: Secondary | ICD-10-CM | POA: Diagnosis not present

## 2016-05-14 DIAGNOSIS — M0579 Rheumatoid arthritis with rheumatoid factor of multiple sites without organ or systems involvement: Secondary | ICD-10-CM | POA: Diagnosis not present

## 2016-05-14 DIAGNOSIS — M329 Systemic lupus erythematosus, unspecified: Secondary | ICD-10-CM | POA: Diagnosis not present

## 2016-05-14 DIAGNOSIS — S82892A Other fracture of left lower leg, initial encounter for closed fracture: Secondary | ICD-10-CM | POA: Diagnosis not present

## 2016-05-21 DIAGNOSIS — I5022 Chronic systolic (congestive) heart failure: Secondary | ICD-10-CM | POA: Diagnosis not present

## 2016-05-21 DIAGNOSIS — S82892D Other fracture of left lower leg, subsequent encounter for closed fracture with routine healing: Secondary | ICD-10-CM | POA: Diagnosis not present

## 2016-05-21 DIAGNOSIS — J45909 Unspecified asthma, uncomplicated: Secondary | ICD-10-CM | POA: Diagnosis not present

## 2016-05-21 DIAGNOSIS — X58XXXD Exposure to other specified factors, subsequent encounter: Secondary | ICD-10-CM | POA: Diagnosis not present

## 2016-05-21 DIAGNOSIS — S82891D Other fracture of right lower leg, subsequent encounter for closed fracture with routine healing: Secondary | ICD-10-CM | POA: Diagnosis not present

## 2016-05-21 DIAGNOSIS — I110 Hypertensive heart disease with heart failure: Secondary | ICD-10-CM | POA: Diagnosis not present

## 2016-05-31 ENCOUNTER — Encounter: Payer: Self-pay | Admitting: *Deleted

## 2016-05-31 ENCOUNTER — Ambulatory Visit (INDEPENDENT_AMBULATORY_CARE_PROVIDER_SITE_OTHER): Payer: 59 | Admitting: Cardiovascular Disease

## 2016-05-31 ENCOUNTER — Encounter: Payer: Self-pay | Admitting: Cardiovascular Disease

## 2016-05-31 VITALS — BP 140/82 | HR 91 | Ht 67.0 in | Wt 177.1 lb

## 2016-05-31 DIAGNOSIS — I420 Dilated cardiomyopathy: Secondary | ICD-10-CM

## 2016-05-31 MED ORDER — POTASSIUM CHLORIDE CRYS ER 20 MEQ PO TBCR: 40.0000 meq | EXTENDED_RELEASE_TABLET | Freq: Every day | ORAL | Status: DC

## 2016-05-31 NOTE — Progress Notes (Signed)
Patient ID: Christine Mcdowell, female   DOB: 1971/07/16, 45 y.o.   MRN: [id number]    Cardiology Office Note    Date:  05/31/2016   ID:  Christine Mcdowell, DOB January 29, 1971, MRN [id number]  PCP:  Maximino Greenland, MD  Cardiologist:  Dr. Jenkins Rouge   Electrophysiologist:  n/a  Dyspnea   History of Present Illness:  Christine Mcdowell is a 45 y.o. female with a hx of advanced lupus with associated myocarditis/cardiomyopathy (dx in 01/2014) with EF 40-45%, HTN, DM, DJD, fibromyalgia, asthma. Last seen by me  in 7/16.  Event monitor for palpitations demonstrated no arrhythmias.    Admitted 1/3-1/7 with acute on chronic combined systolic and diastolic CHF in the setting of community acquired pneumonia. She was diuresed with IV Lasix and treated with antibiotics.  Echocardiogram demonstrated EF 35-40%.  Serologic studies apparently did not suggest active lupus. She had been off of her medication secondary to finances. She was placed back on steroids.  Returns for follow-up.  She is doing fairly well. She denies chest pain, syncope.  She sleeps on an incline chronically. She notes mild pedal edema without change.  Her weight has increased since DC by 4 lbs.  She notes improved cough.  She still has a lot of upper airway congestion and sees her PCP  to discuss this further.    Issues with palpitations ? PaC;s  Some PND/orthopnea.  Some edema  Alopecia with chemo for lupus    Taking methotrexate on Fridays and Actemra shots she gives herself every two weeks  Broke a bone in her left ankle 8 weeks ago and cast came off this week  Excited about show she is putting on "[employer]"  Working at [employer] center during day    Past Medical History  Diagnosis Date  . Lupus (systemic lupus erythematosus) ([place])   . Arthritis   . Hypertension   . Asthma   . Fibromyalgia   . CHF (congestive heart failure) ([place])   . Diabetes mellitus without complication ([place])    steroid induced  . Hx of echocardiogram     Echo (9/15):  EF 50-55%, ant HK, Gr 1 DD, mild MR, mild LAE, no effusion  . Hx of cardiovascular stress test     ETT-Myoview (9/15):  No ischemia, EF 52%; NORMAL    Past Surgical History  Procedure Laterality Date  . Knee surgery    . Tubal ligation    . Abdominal hysterectomy  2009    Current Medications: Previous Medications   ACTEMRA 162 MG/0.9ML SOSY    Inject 162 mg into the skin every 14 (fourteen) days.   ALBUTEROL (ACCUNEB) 1.25 MG/3ML NEBULIZER SOLUTION    Take 3 mLs (1.25 mg total) by nebulization every 6 (six) hours as needed for wheezing.   ASPIRIN 81 MG CHEWABLE TABLET    Chew 162 mg by mouth every morning.    CARVEDILOL (COREG) 6.25 MG TABLET    Take 1 tablet (6.25 mg total) by mouth 2 (two) times daily with a meal.   FOLIC ACID (FOLVITE) 1 MG TABLET    Take 1 tablet (1 mg total) by mouth every morning.   FUROSEMIDE (LASIX) 40 MG TABLET    Take 1 tablet (40 mg total) by mouth daily.   GUAIFENESIN (ROBITUSSIN) 100 MG/5ML SOLN    Take 5 mLs (100 mg total) by mouth every 4 (four) hours as needed for cough or to loosen phlegm.   HUMALOG [facility]  100 UNIT/ML KIWKPEN    Inject 0-20 Units into the skin 3 (three) times daily as needed (for blood sugar). Sliding scale   HYDROCORTISONE CREAM 1 %    Apply 1 application topically 4 (four) times daily as needed for itching (rash).   HYDROXYCHLOROQUINE (PLAQUENIL) 200 MG TABLET    Take 1 tablet (200 mg total) by mouth every morning.   IBUPROFEN (ADVIL,MOTRIN) 200 MG TABLET    Take 600 mg by mouth every 6 (six) hours as needed for headache or moderate pain.   LISINOPRIL (PRINIVIL,ZESTRIL) 20 MG TABLET    Take 1 tablet (20 mg total) by mouth daily.   LORATADINE (CLARITIN) 10 MG TABLET    Take 1 tablet (10 mg total) by mouth daily.   MELOXICAM (MOBIC) 15 MG TABLET    Take 1 tablet (15 mg total) by mouth daily as needed. Inflammation or pain   METHOTREXATE (RHEUMATREX) 2.5 MG TABLET    Take 20 mg  by mouth once a week. Take 8 tablets by mouth once a week on Wednesday   MULTIPLE VITAMIN (MULTIVITAMIN WITH MINERALS) TABS    Take 1 tablet by mouth every morning.    ONDANSETRON (ZOFRAN) 4 MG TABLET    Take 1 tablet (4 mg total) by mouth every 8 (eight) hours as needed for nausea or vomiting.   PREDNISONE (DELTASONE) 20 MG TABLET    Take 1 tablet (20 mg total) by mouth daily with breakfast.   TORSEMIDE (DEMADEX) 10 MG TABLET    Take 1 tablet (10 mg total) by mouth daily as needed (fluid).   TRAZODONE (DESYREL) 50 MG TABLET    Take 50 mg by mouth at bedtime as needed for sleep.      Allergies:   Dilaudid; Erythromycin; Hydromorphone; Iodinated diagnostic agents; and Latex   Social History   Social History  . Marital Status: Divorced    Spouse Name: N/A  . Number of Children: N/A  . Years of Education: N/A   Social History Main Topics  . Smoking status: Never Smoker   . Smokeless tobacco: Never Used  . Alcohol Use: No  . Drug Use: No  . Sexual Activity: Not Asked   Other Topics Concern  . None   Social History Narrative     Family History:  The patient's family history includes Diabetes in her other; Heart attack in her father and paternal grandmother; Hypertension in her other.   ROS:   Please see the history of present illness.    Review of Systems  HENT: Positive for headaches.   Cardiovascular: Positive for dyspnea on exertion and leg swelling.  Respiratory: Positive for cough and shortness of breath.   Skin: Positive for rash.  Musculoskeletal: Positive for joint pain, joint swelling and myalgias.  All other systems reviewed and are negative.   PHYSICAL EXAM:   VS:  BP 140/82 mmHg  Pulse 91  Ht 5\' 7"  (1.702 m)  Wt 80.341 kg (177 lb 1.9 oz)  BMI 27.73 kg/m2  SpO2 97%   GEN: cushingoid black female  HEENT: alopecia with hair piece on  Neck: no JVD, no masses Cardiac: Normal S1/S2, RRR; no murmurs, rubs, or gallops, no edema;     Respiratory:  clear to  auscultation bilaterally; no wheezing, rhonchi or rales GI: soft, nontender, nondistended MS: no deformity or atrophy Skin: warm and dry, no rash Neuro:   no focal deficits  Psych: Alert and oriented x 3, normal affect Trace LE edema bilateral worse in  left ankle   Wt Readings from Last 3 Encounters:  05/31/16 80.341 kg (177 lb 1.9 oz)  01/22/16 79.379 kg (175 lb)  01/04/16 80.196 kg (176 lb 12.8 oz)      Studies/Labs Reviewed:   EKG:   12/28/15  SR rate 88  LVH nonspecific ST/T wave changes   Recent Labs: 11/18/2015: ALT 14 12/29/2015: Magnesium 2.2 03/04/2016: Hemoglobin 11.3*; Platelets 198 03/27/2016: Brain Natriuretic Peptide 99.6; BUN 16; Creat 0.85; Potassium 3.4*; Sodium 140   Recent Lipid Panel No results found for: CHOL, TRIG, HDL, CHOLHDL, VLDL, LDLCALC, LDLDIRECT  Additional studies/ records that were reviewed today include:   Echo 12/27/15 Mild LVH, EF 35-40%, diff HK, normal diastolic function, mild MR, trivial pericardial effusion  Event Monitor 08/17/15 NSR No arrhythmias No correlation with palpitations or chest pain   Cardiac MRI 03/02/15:  IMPRESSION: 1) Moderate LVE diffuse hypokinesis EF 46% Similar to MRI done 01/28/14 2) No delayed gadolinium uptake or evidence of myocarditis 3) Normal RA/RV/LA 4) Trivial posterior pericardial effusion  Myoview 9/15 No ischemia, EF 52%, normal  Echo (3/15):  Mild LVH, EF 35-40%, diff HK, mild MR.  Event Monitor : 01/2016 NSR Infrequent PVC;s Triggerred events palpitations, chest pain not associated with arrhythmias   ASSESSMENT:    1. Cardiomyopathy, dilated ([place])     PLAN:  In order of problems listed above:  1. Chronic combined systolic and diastolic HF -  Demedex PRN  In addition to lasix    2. Dilated cardiomyopathy - MRI in 2016 with EF 46%. Recent echocardiogram with EF 35-40%. This was felt to be close to her baseline. Continue beta blocker, ACE inhibitor.  3. Lupus - Follow-Up with rheumatology  as planned. Dr Eliberto Ivory methotrexate and Actemra working well   4. HTN - Controlled. Continue current therapy. If BP high in am will take extra prinivil at night when she goes to bed   5. Ortho:  Left ankle healing well encouraged wearing flat shoes and brace   6. Palpitations benign event monitor with infrequent PVC;s    Jenkins Rouge

## 2016-05-31 NOTE — Patient Instructions (Signed)

## 2016-06-12 DIAGNOSIS — R51 Headache: Secondary | ICD-10-CM | POA: Diagnosis not present

## 2016-06-12 DIAGNOSIS — I5042 Chronic combined systolic (congestive) and diastolic (congestive) heart failure: Secondary | ICD-10-CM | POA: Diagnosis not present

## 2016-06-12 DIAGNOSIS — I110 Hypertensive heart disease with heart failure: Secondary | ICD-10-CM | POA: Diagnosis not present

## 2016-07-02 ENCOUNTER — Telehealth: Payer: Self-pay | Admitting: Cardiovascular Disease

## 2016-07-02 DIAGNOSIS — R0600 Dyspnea, unspecified: Secondary | ICD-10-CM | POA: Diagnosis not present

## 2016-07-02 DIAGNOSIS — I110 Hypertensive heart disease with heart failure: Secondary | ICD-10-CM | POA: Diagnosis not present

## 2016-07-02 DIAGNOSIS — Z9114 Patient's other noncompliance with medication regimen: Secondary | ICD-10-CM | POA: Diagnosis not present

## 2016-07-02 NOTE — Telephone Encounter (Signed)
Patient called to report she has been taking up SOB during the night for 1 week, with last night being the worst. She st she sleeps with her head elevated but it does not seem to help. She reports she has a "full feeling" due to the fluid collected in her legs. She took a PRN dose of Torsemide late last night and she does feel "a little better."  She reports her BP has been elevated a little. At the highest, it was 171/119. Today it was "150/100ish." This was after medication.  The only other symptom she mentions is nausea secondary to chemo last week.  She is not currently SOB right now, and she denies CP. Scheduled patient for evaluation tomorrow with [place], [place].  She understands to seek medical attention if SOB worsens in the meantime.

## 2016-07-02 NOTE — Telephone Encounter (Signed)
Pt c/o Shortness Of Breath: STAT if SOB developed within the last 24 hours or pt is noticeably SOB on the phone  1. Are you currently SOB (can you hear that pt is SOB on the phone)? Yes 2. How long have you been experiencing SOB? A few days 3. Are you SOB when sitting or when up moving around? Moving around 4.  Are you currently experiencing any other symptoms? Edema-legs, hands and feet

## 2016-07-03 ENCOUNTER — Encounter (HOSPITAL_COMMUNITY): Payer: Self-pay | Admitting: Nurse Practitioner

## 2016-07-03 ENCOUNTER — Observation Stay (HOSPITAL_COMMUNITY)
Admission: EM | Admit: 2016-07-03 | Discharge: 2016-07-04 | Disposition: A | Discharge status: Home / Self Care | Payer: BLUE CROSS/BLUE SHIELD | Attending: Internal Medicine | Admitting: Internal Medicine

## 2016-07-03 ENCOUNTER — Ambulatory Visit: Payer: 59 | Admitting: Physician Assistant

## 2016-07-03 ENCOUNTER — Emergency Department (HOSPITAL_COMMUNITY): Payer: BLUE CROSS/BLUE SHIELD

## 2016-07-03 DIAGNOSIS — Z7982 Long term (current) use of aspirin: Secondary | ICD-10-CM | POA: Insufficient documentation

## 2016-07-03 DIAGNOSIS — Z79899 Other long term (current) drug therapy: Secondary | ICD-10-CM | POA: Diagnosis not present

## 2016-07-03 DIAGNOSIS — D696 Thrombocytopenia, unspecified: Secondary | ICD-10-CM | POA: Diagnosis present

## 2016-07-03 DIAGNOSIS — R4781 Slurred speech: Secondary | ICD-10-CM | POA: Diagnosis not present

## 2016-07-03 DIAGNOSIS — R42 Dizziness and giddiness: Secondary | ICD-10-CM | POA: Diagnosis not present

## 2016-07-03 DIAGNOSIS — E118 Type 2 diabetes mellitus with unspecified complications: Secondary | ICD-10-CM | POA: Insufficient documentation

## 2016-07-03 DIAGNOSIS — I160 Hypertensive urgency: Secondary | ICD-10-CM | POA: Diagnosis not present

## 2016-07-03 DIAGNOSIS — Z794 Long term (current) use of insulin: Secondary | ICD-10-CM | POA: Insufficient documentation

## 2016-07-03 DIAGNOSIS — R072 Precordial pain: Secondary | ICD-10-CM

## 2016-07-03 DIAGNOSIS — Z7952 Long term (current) use of systemic steroids: Secondary | ICD-10-CM | POA: Diagnosis not present

## 2016-07-03 DIAGNOSIS — R0789 Other chest pain: Secondary | ICD-10-CM | POA: Insufficient documentation

## 2016-07-03 DIAGNOSIS — E785 Hyperlipidemia, unspecified: Secondary | ICD-10-CM | POA: Insufficient documentation

## 2016-07-03 DIAGNOSIS — M329 Systemic lupus erythematosus, unspecified: Secondary | ICD-10-CM | POA: Insufficient documentation

## 2016-07-03 DIAGNOSIS — J45909 Unspecified asthma, uncomplicated: Secondary | ICD-10-CM | POA: Diagnosis not present

## 2016-07-03 DIAGNOSIS — M797 Fibromyalgia: Secondary | ICD-10-CM | POA: Insufficient documentation

## 2016-07-03 DIAGNOSIS — I420 Dilated cardiomyopathy: Secondary | ICD-10-CM | POA: Diagnosis not present

## 2016-07-03 DIAGNOSIS — I161 Hypertensive emergency: Secondary | ICD-10-CM | POA: Diagnosis not present

## 2016-07-03 DIAGNOSIS — R079 Chest pain, unspecified: Secondary | ICD-10-CM | POA: Diagnosis not present

## 2016-07-03 DIAGNOSIS — I169 Hypertensive crisis, unspecified: Secondary | ICD-10-CM | POA: Diagnosis present

## 2016-07-03 DIAGNOSIS — I5022 Chronic systolic (congestive) heart failure: Secondary | ICD-10-CM | POA: Diagnosis present

## 2016-07-03 DIAGNOSIS — I10 Essential (primary) hypertension: Secondary | ICD-10-CM

## 2016-07-03 DIAGNOSIS — I110 Hypertensive heart disease with heart failure: Secondary | ICD-10-CM | POA: Insufficient documentation

## 2016-07-03 DIAGNOSIS — R0602 Shortness of breath: Secondary | ICD-10-CM | POA: Diagnosis not present

## 2016-07-03 DIAGNOSIS — E119 Type 2 diabetes mellitus without complications: Secondary | ICD-10-CM

## 2016-07-03 HISTORY — DX: Personal history of other medical treatment: Z92.89

## 2016-07-03 HISTORY — DX: Pneumonia, unspecified organism: J18.9

## 2016-07-03 HISTORY — DX: Sickle-cell trait: D57.3

## 2016-07-03 HISTORY — DX: Anemia, unspecified: D64.9

## 2016-07-03 HISTORY — DX: Discoid lupus erythematosus: L93.0

## 2016-07-03 HISTORY — DX: Angina pectoris, unspecified: I20.9

## 2016-07-03 HISTORY — DX: Migraine, unspecified, not intractable, without status migrainosus: G43.909

## 2016-07-03 HISTORY — DX: Iron deficiency anemia, unspecified: D50.9

## 2016-07-03 HISTORY — DX: Malignant neoplasm of cervix uteri, unspecified: C53.9

## 2016-07-03 HISTORY — DX: Cerebral infarction, unspecified: I63.9

## 2016-07-03 HISTORY — DX: Rheumatoid arthritis, unspecified: M06.9

## 2016-07-03 LAB — URINALYSIS, ROUTINE W REFLEX MICROSCOPIC
Bilirubin Urine: NEGATIVE
Glucose, UA: NEGATIVE mg/dL
Hgb urine dipstick: NEGATIVE
Ketones, ur: NEGATIVE mg/dL
Leukocytes, UA: NEGATIVE
Nitrite: NEGATIVE
Protein, ur: NEGATIVE mg/dL
Specific Gravity, Urine: 1.021 (ref 1.005–1.030)
pH: 6.5 (ref 5.0–8.0)

## 2016-07-03 LAB — D-DIMER, QUANTITATIVE (NOT AT ARMC): D-Dimer, Quant: 0.6 ug/mL-FEU — ABNORMAL HIGH (ref 0.00–0.50)

## 2016-07-03 LAB — CBC
HCT: 37.2 % (ref 36.0–46.0)
Hemoglobin: 12.3 g/dL (ref 12.0–15.0)
MCH: 29.4 pg (ref 26.0–34.0)
MCHC: 33.1 g/dL (ref 30.0–36.0)
MCV: 89 fL (ref 78.0–100.0)
Platelets: 125 10*3/uL — ABNORMAL LOW (ref 150–400)
RBC: 4.18 MIL/uL (ref 3.87–5.11)
RDW: 15.1 % (ref 11.5–15.5)
WBC: 7.9 10*3/uL (ref 4.0–10.5)

## 2016-07-03 LAB — BASIC METABOLIC PANEL
Anion gap: 9 (ref 5–15)
BUN: 16 mg/dL (ref 6–20)
CO2: 22 mmol/L (ref 22–32)
Calcium: 8.6 mg/dL — ABNORMAL LOW (ref 8.9–10.3)
Chloride: 108 mmol/L (ref 101–111)
Creatinine, Ser: 0.78 mg/dL (ref 0.44–1.00)
GFR calc Af Amer: >60 mL/min (ref >60)
GFR calc non Af Amer: >60 mL/min (ref >60)
Glucose, Bld: 136 mg/dL — ABNORMAL HIGH (ref 65–99)
Potassium: 3.7 mmol/L (ref 3.5–5.1)
Sodium: 139 mmol/L (ref 135–145)

## 2016-07-03 LAB — I-STAT TROPONIN, ED: Troponin i, poc: 0 ng/mL (ref 0.00–0.08)

## 2016-07-03 LAB — TROPONIN I
Troponin I: <0.03 ng/mL (ref <0.03)
Troponin I: 0.03 ng/mL (ref <0.03)

## 2016-07-03 LAB — GLUCOSE, CAPILLARY
Glucose-Capillary: 100 mg/dL — ABNORMAL HIGH (ref 65–99)
Glucose-Capillary: 106 mg/dL — ABNORMAL HIGH (ref 65–99)

## 2016-07-03 LAB — BRAIN NATRIURETIC PEPTIDE: B Natriuretic Peptide: 129.5 pg/mL — ABNORMAL HIGH (ref 0.0–100.0)

## 2016-07-03 MED ORDER — MORPHINE SULFATE (PF) 4 MG/ML IV SOLN
2.5000 mg | Freq: Once | INTRAVENOUS | Status: AC
Start: 2016-07-03 — End: 2016-07-03
  Administered 2016-07-03: 2.5 mg via INTRAVENOUS
  Filled 2016-07-03: qty 1

## 2016-07-03 MED ORDER — PREDNISONE 20 MG PO TABS
20.0000 mg | ORAL_TABLET | Freq: Every day | ORAL | Status: DC
  Administered 2016-07-04: 20 mg via ORAL
  Filled 2016-07-03: qty 1

## 2016-07-03 MED ORDER — IBUPROFEN 600 MG PO TABS: 600.0000 mg | ORAL_TABLET | Freq: Four times a day (QID) | ORAL | Status: DC | PRN

## 2016-07-03 MED ORDER — TRAZODONE HCL 50 MG PO TABS: 50.0000 mg | ORAL_TABLET | Freq: Every evening | ORAL | Status: DC | PRN

## 2016-07-03 MED ORDER — IRBESARTAN 300 MG PO TABS
150.0000 mg | ORAL_TABLET | Freq: Every day | ORAL | Status: DC
  Administered 2016-07-04: 150 mg via ORAL
  Filled 2016-07-03 (×2): qty 1

## 2016-07-03 MED ORDER — ASPIRIN 81 MG PO CHEW: 162.0000 mg | CHEWABLE_TABLET | Freq: Every morning | ORAL | Status: DC

## 2016-07-03 MED ORDER — ACETAMINOPHEN 325 MG PO TABS: 650.0000 mg | ORAL_TABLET | ORAL | Status: DC | PRN

## 2016-07-03 MED ORDER — GI COCKTAIL ~~LOC~~: 30.0000 mL | Freq: Four times a day (QID) | ORAL | Status: DC | PRN

## 2016-07-03 MED ORDER — ONDANSETRON HCL 4 MG/2ML IJ SOLN: 4.0000 mg | Freq: Four times a day (QID) | INTRAMUSCULAR | Status: DC | PRN

## 2016-07-03 MED ORDER — GI COCKTAIL ~~LOC~~
30.0000 mL | Freq: Once | ORAL | Status: AC
  Administered 2016-07-03: 30 mL via ORAL
  Filled 2016-07-03: qty 30

## 2016-07-03 MED ORDER — INSULIN ASPART 100 UNIT/ML ~~LOC~~ SOLN: 0.0000 [IU] | Freq: Three times a day (TID) | SUBCUTANEOUS | Status: DC

## 2016-07-03 MED ORDER — ADULT MULTIVITAMIN W/MINERALS CH: 1.0000 | ORAL_TABLET | Freq: Every morning | ORAL | Status: DC

## 2016-07-03 MED ORDER — POTASSIUM CHLORIDE CRYS ER 20 MEQ PO TBCR: 40.0000 meq | EXTENDED_RELEASE_TABLET | Freq: Every day | ORAL | Status: DC

## 2016-07-03 MED ORDER — ALBUTEROL SULFATE (2.5 MG/3ML) 0.083% IN NEBU: 2.5000 mg | INHALATION_SOLUTION | Freq: Four times a day (QID) | RESPIRATORY_TRACT | Status: DC | PRN

## 2016-07-03 MED ORDER — HYDROXYCHLOROQUINE SULFATE 200 MG PO TABS
200.0000 mg | ORAL_TABLET | Freq: Two times a day (BID) | ORAL | Status: DC
  Administered 2016-07-03 – 2016-07-04 (×2): 200 mg via ORAL
  Filled 2016-07-03 (×3): qty 1

## 2016-07-03 MED ORDER — CARVEDILOL 12.5 MG PO TABS
12.5000 mg | ORAL_TABLET | Freq: Two times a day (BID) | ORAL | Status: DC
Start: 2016-07-03 — End: 2016-07-04
  Administered 2016-07-03 – 2016-07-04 (×2): 12.5 mg via ORAL
  Filled 2016-07-03 (×2): qty 1

## 2016-07-03 MED ORDER — ASPIRIN 81 MG PO CHEW
162.0000 mg | CHEWABLE_TABLET | Freq: Every day | ORAL | Status: DC
  Administered 2016-07-04: 162 mg via ORAL
  Filled 2016-07-03: qty 2

## 2016-07-03 MED ORDER — FUROSEMIDE 40 MG PO TABS
40.0000 mg | ORAL_TABLET | Freq: Two times a day (BID) | ORAL | Status: DC
  Administered 2016-07-03 – 2016-07-04 (×2): 40 mg via ORAL
  Filled 2016-07-03 (×2): qty 1

## 2016-07-03 MED ORDER — ENOXAPARIN SODIUM 40 MG/0.4ML ~~LOC~~ SOLN
40.0000 mg | SUBCUTANEOUS | Status: DC
  Administered 2016-07-03: 40 mg via SUBCUTANEOUS
  Filled 2016-07-03 (×2): qty 0.4

## 2016-07-03 MED ORDER — POTASSIUM CHLORIDE CRYS ER 20 MEQ PO TBCR
40.0000 meq | EXTENDED_RELEASE_TABLET | Freq: Every day | ORAL | Status: DC
  Administered 2016-07-04: 40 meq via ORAL
  Filled 2016-07-03: qty 2

## 2016-07-03 MED ORDER — DIPHENHYDRAMINE HCL 25 MG PO TABS
75.0000 mg | ORAL_TABLET | Freq: Four times a day (QID) | ORAL | Status: DC | PRN
  Filled 2016-07-03: qty 3

## 2016-07-03 MED ORDER — HYDRALAZINE HCL 20 MG/ML IJ SOLN: 5.0000 mg | INTRAMUSCULAR | Status: DC | PRN

## 2016-07-03 MED ORDER — TORSEMIDE 20 MG PO TABS
10.0000 mg | ORAL_TABLET | Freq: Every day | ORAL | Status: DC | PRN
  Filled 2016-07-03: qty 1

## 2016-07-03 MED ORDER — ALBUTEROL SULFATE 1.25 MG/3ML IN NEBU: 1.0000 | INHALATION_SOLUTION | Freq: Four times a day (QID) | RESPIRATORY_TRACT | Status: DC | PRN

## 2016-07-03 MED ORDER — ADULT MULTIVITAMIN W/MINERALS CH
1.0000 | ORAL_TABLET | Freq: Every day | ORAL | Status: DC
  Administered 2016-07-04: 1 via ORAL
  Filled 2016-07-03: qty 1

## 2016-07-03 MED ORDER — MORPHINE SULFATE (PF) 2 MG/ML IV SOLN: 2.0000 mg | INTRAVENOUS | Status: DC | PRN

## 2016-07-03 MED ORDER — INSULIN ASPART 100 UNIT/ML ~~LOC~~ SOLN: 0.0000 [IU] | Freq: Every day | SUBCUTANEOUS | Status: DC

## 2016-07-03 MED ORDER — KETOROLAC TROMETHAMINE 30 MG/ML IJ SOLN
30.0000 mg | Freq: Once | INTRAMUSCULAR | Status: AC
  Administered 2016-07-03: 30 mg via INTRAVENOUS
  Filled 2016-07-03: qty 1

## 2016-07-03 NOTE — ED Notes (Signed)
She c/o high blood pressure, CP, SOB, headaches today. Her PCP has been adjusting her BP medications this week, she took an extra dose of her valsartan today but continued to have symptoms so came to ED. She is alert and breathing easily

## 2016-07-03 NOTE — ED Notes (Signed)
new dx of chf and last 4 weeks her bp has been spiking had some cp and sob, she is sob on least exertion her feet swelling, also has hx of lupus

## 2016-07-03 NOTE — ED Notes (Signed)
Iv team here to see pt and start iv

## 2016-07-03 NOTE — H&P (Signed)
History and Physical    Christine Mcdowell [id number] DOB: Aug 15, 1971 DOA: 07/03/2016  PCP: Maximino Greenland, MD Patient coming from: home  Chief Complaint: chest pain  HPI: Christine Mcdowell is a very pleasant 45 y.o. female with medical history significant for advanced lupus with associated myocarditis/cardiomyopathy with an EF 35-40%, hypertension, hyperlipidemia, diabetes, fibromyalgia, cva presents to the emergency department with the chief complaint of chest pain and high blood pressure. Initial evaluation and feels hypertensive urgency and atypical chest pain.  Information is obtained from the patient. She reports over the last 3 weeks ago to increase edema in her lower extremities abdomen and hands. She states she took an extra Demadex per PCP recommendation. She notes some improvement in the swelling. She reports she always sleeps propped up and there is no worsening. Associated symptoms include chest pain dizziness headache, nausea without emesis some intermittent palpitations. He denies  syncope or near-syncope cough fever chills. She denies dysuria hematuria frequency or urgency. Location of pain left anterior described as sharp intermittent. She denies each episode lasts only "a few seconds".    ED Course: In emergency department she's afebrile hypertensive with a blood pressure 162/112 heart rate 90 rest or 17. She's not hypoxic she is nontoxic appearing  Review of Systems: As per HPI otherwise 10 point review of systems negative.   Ambulatory Status: Independently with steady gait  Past Medical History  Diagnosis Date  . Lupus (systemic lupus erythematosus) ([place])   . Arthritis   . Hypertension   . Asthma   . Fibromyalgia   . CHF (congestive heart failure) ([place])   . Diabetes mellitus without complication ([place])     steroid induced  . Hx of echocardiogram     Echo (9/15):  EF 50-55%, ant HK, Gr 1 DD, mild MR, mild LAE, no effusion  . Hx of cardiovascular stress test       ETT-Myoview (9/15):  No ischemia, EF 52%; NORMAL    Past Surgical History  Procedure Laterality Date  . Knee surgery    . Tubal ligation    . Abdominal hysterectomy  2009    Social History   Social History  . Marital Status: Divorced    Spouse Name: N/A  . Number of Children: N/A  . Years of Education: N/A   Occupational History  . Not on file.   Social History Main Topics  . Smoking status: Never Smoker   . Smokeless tobacco: Never Used  . Alcohol Use: No  . Drug Use: No  . Sexual Activity: Not on file   Other Topics Concern  . Not on file   Social History Narrative   She lives at home alone she works for the [employer] Allergies  Allergen Reactions  . Dilaudid [Hydromorphone Hcl] Nausea And Vomiting and Other (See Comments)    Raises blood pressure   . Erythromycin Nausea And Vomiting  . Hydromorphone Nausea And Vomiting    Other reaction(s): GI Upset (intolerance), Hypertension (intolerance) Raises blood pressure   . Iodinated Diagnostic Agents Other (See Comments)    Flu like symptoms  . Latex Hives    Family History  Problem Relation Age of Onset  . Diabetes Other   . Hypertension Other   . Heart attack Father   . Heart attack Paternal Grandmother     Prior to Admission medications   Medication Sig Start Date End Date Taking? Authorizing Provider  ACTEMRA 162 MG/0.9ML SOSY Inject 162 mg into the skin every 14 (fourteen)  days. 11/09/15  Yes Historical Provider, MD  aspirin 81 MG chewable tablet Chew 162 mg by mouth every morning.    Yes Historical Provider, MD  carvedilol (COREG) 6.25 MG tablet Take 1 tablet (6.25 mg total) by mouth 2 (two) times daily with a meal. Patient taking differently: Take 6.25-12.5 mg by mouth 2 (two) times daily with a meal. 12.5mg  in the morning and 6.25mg  at night 01/04/16  Yes Scott T Weaver, PA-C  diphenhydrAMINE (BENADRYL) 25 MG tablet Take 75 mg by mouth every 6 (six) hours as needed for allergies.   Yes  Historical Provider, MD  folic acid (FOLVITE) 1 MG tablet Take 1 tablet (1 mg total) by mouth every morning. 01/04/16  Yes Scott Joylene Draft, PA-C  furosemide (LASIX) 40 MG tablet Take 1 tablet (40 mg total) by mouth daily. Patient taking differently: Take 40 mg by mouth 2 (two) times daily.  01/04/16  Yes Scott Joylene Draft, PA-C  hydroxychloroquine (PLAQUENIL) 200 MG tablet Take 1 tablet (200 mg total) by mouth every morning. Patient taking differently: Take 200 mg by mouth 2 (two) times daily.  12/30/15  Yes Robbie Lis, MD  meloxicam (MOBIC) 15 MG tablet Take 1 tablet (15 mg total) by mouth daily as needed. Inflammation or pain 12/30/15  Yes Robbie Lis, MD  methotrexate (RHEUMATREX) 2.5 MG tablet Take 20 mg by mouth once a week. Take 8 tablets by mouth once a week on Friday 02/06/15  Yes Historical Provider, MD  Multiple Vitamin (MULTIVITAMIN WITH MINERALS) TABS Take 1 tablet by mouth every morning.    Yes Historical Provider, MD  potassium chloride SA (K-DUR,KLOR-CON) 20 MEQ tablet Take 2 tablets (40 mEq total) by mouth daily. 05/31/16  Yes Josue Hector, MD  predniSONE (DELTASONE) 20 MG tablet Take 1 tablet (20 mg total) by mouth daily with breakfast. 12/30/15  Yes Robbie Lis, MD  torsemide (DEMADEX) 10 MG tablet Take 1 tablet (10 mg total) by mouth daily as needed (fluid). 01/04/16  Yes Scott T Kathlen Mody, PA-C  valsartan (DIOVAN) 160 MG tablet Take 160 mg by mouth 2 (two) times daily.  06/14/16  Yes Historical Provider, MD  albuterol (ACCUNEB) 1.25 MG/3ML nebulizer solution Take 3 mLs (1.25 mg total) by nebulization every 6 (six) hours as needed for wheezing. 03/04/16   Lacretia Leigh, MD  HUMALOG KWIKPEN 100 UNIT/ML KiwkPen Inject 0-20 Units into the skin 3 (three) times daily as needed (for blood sugar). Sliding scale 02/01/16   Historical Provider, MD  ibuprofen (ADVIL,MOTRIN) 200 MG tablet Take 600 mg by mouth every 6 (six) hours as needed for headache or moderate pain.    Historical Provider, MD    lisinopril (PRINIVIL,ZESTRIL) 20 MG tablet Take 1 tablet (20 mg total) by mouth daily. Patient not taking: Reported on 07/03/2016 12/30/15   Robbie Lis, MD  loratadine (CLARITIN) 10 MG tablet Take 1 tablet (10 mg total) by mouth daily. Patient not taking: Reported on 07/03/2016 12/30/15   Robbie Lis, MD  ondansetron (ZOFRAN) 4 MG tablet Take 1 tablet (4 mg total) by mouth every 8 (eight) hours as needed for nausea or vomiting. 12/30/15   Robbie Lis, MD  traZODone (DESYREL) 50 MG tablet Take 50 mg by mouth at bedtime as needed for sleep.  02/08/15   Historical Provider, MD    Physical Exam: Filed Vitals:   07/03/16 1345 07/03/16 1400 07/03/16 1605 07/03/16 1701  BP: 140/102  153/113 167/110  Pulse: 92 90 89 95  Temp:    98.6 F (37 C)  TempSrc:    Oral  Resp: 19 16 20 18   Height:    5\' 7"  (1.702 m)  Weight:    83.1 kg (183 lb 3.2 oz)  SpO2: 99% 98% 99% 100%     General:  Appears calm and comfortable, sitting up in bed Eyes:  PERRL, EOMI, normal lids, iris ENT:  grossly normal hearing, lips & tongue, mmm Neck:  no LAD, masses or thyromegaly Cardiovascular:  RRR, no m/r/g. No LE edema. Mild tenderness to palpation chest Respiratory:  CTA bilaterally, no w/r/r. Normal respiratory effort. Abdomen:  soft, ntnd, NABS Skin:  no rash or induration seen on limited exam Musculoskeletal:  grossly normal tone BUE/BLE, good ROM, no bony abnormality Psychiatric:  grossly normal mood and affect, speech fluent and appropriate, AOx3 Neurologic:  CN 2-12 grossly intact, moves all extremities in coordinated fashion, sensation intact  Labs on Admission: I have personally reviewed following labs and imaging studies  CBC:  Recent Labs Lab 07/03/16 1203  WBC 7.9  HGB 12.3  HCT 37.2  MCV 89.0  PLT 0000000*   Basic Metabolic Panel:  Recent Labs Lab 07/03/16 1203  NA 139  K 3.7  CL 108  CO2 22  GLUCOSE 136*  BUN 16  CREATININE 0.78  CALCIUM 8.6*   GFR: Estimated Creatinine  Clearance: 96.3 mL/min (by C-G formula based on Cr of 0.78). Liver Function Tests: No results for input(s): AST, ALT, ALKPHOS, BILITOT, PROT, ALBUMIN in the last 168 hours. No results for input(s): LIPASE, AMYLASE in the last 168 hours. No results for input(s): AMMONIA in the last 168 hours. Coagulation Profile: No results for input(s): INR, PROTIME in the last 168 hours. Cardiac Enzymes:  Recent Labs Lab 07/03/16 1505  TROPONINI <0.03   BNP (last 3 results) No results for input(s): PROBNP in the last 8760 hours. HbA1C: No results for input(s): HGBA1C in the last 72 hours. CBG: No results for input(s): GLUCAP in the last 168 hours. Lipid Profile: No results for input(s): CHOL, HDL, LDLCALC, TRIG, CHOLHDL, LDLDIRECT in the last 72 hours. Thyroid Function Tests: No results for input(s): TSH, T4TOTAL, FREET4, T3FREE, THYROIDAB in the last 72 hours. Anemia Panel: No results for input(s): VITAMINB12, FOLATE, FERRITIN, TIBC, IRON, RETICCTPCT in the last 72 hours. Urine analysis:    Component Value Date/Time   COLORURINE YELLOW 03/04/2016 0905   APPEARANCEUR CLOUDY* 03/04/2016 0905   LABSPEC 1.021 03/04/2016 0905   PHURINE 6.0 03/04/2016 0905   GLUCOSEU NEGATIVE 03/04/2016 0905   HGBUR NEGATIVE 03/04/2016 0905   BILIRUBINUR NEGATIVE 03/04/2016 0905   KETONESUR NEGATIVE 03/04/2016 0905   PROTEINUR NEGATIVE 03/04/2016 0905   UROBILINOGEN 1.0 05/29/2015 0536   NITRITE NEGATIVE 03/04/2016 0905   LEUKOCYTESUR NEGATIVE 03/04/2016 0905    Creatinine Clearance: Estimated Creatinine Clearance: 96.3 mL/min (by C-G formula based on Cr of 0.78).  Sepsis Labs: @LABRCNTIP (procalcitonin:4,lacticidven:4) )No results found for this or any previous visit (from the past 240 hour(s)).   Radiological Exams on Admission: Dg Chest 2 View  07/03/2016  CLINICAL DATA:  Chest pain and short of breath EXAM: CHEST  2 VIEW COMPARISON:  03/04/2016 FINDINGS: Cardiac enlargement without heart failure.  Mild left lower lobe airspace disease compatible with atelectasis. No significant effusion. No mass lesion. IMPRESSION: Cardiac enlargement without heart failure Left lower lobe atelectasis. Electronically Signed   By: Franchot Gallo M.D.   On: 07/03/2016 14:49    EKG: Independently reviewed. Normal sinus rhythm Possible Left  atrial enlargement Left axis deviation  Assessment/Plan Principal Problem:   Chest pain Active Problems:   SLE (systemic lupus erythematosus) ([facility])   Chronic systolic heart failure ([facility])   Type 2 diabetes mellitus ([place])   Hypertensive crisis   Hypertensive urgency   Thrombocytopenia ([place])   #1. Chest pain. Atypical. Heart score 3-4. Pain-free on admission. He be related to elevated blood pressure. Some relief with Toradol. Somewhat reproducible. Initial troponin negative. EKG shows normal sinus rhythm acute changes. Chest x-ray with cardiac enlargement without heart failure left lower lobe atelectasis. Evaluated by cardiology who recommends no further ischemic evaluation at this time. -We'll cycle troponins -Serial EKG -Supportive therapy  #2. Hypertensive crisis. Reports compliance with her medications. Blood pressure continues to be elevated. Evaluated by cardiology who recommended increase Coreg to 12.5 and consider amlodipine 5 mg.also recommend evaluation of possible secondary causes of HTN including renal artery stenosis or lupus related glomerulonephritis.  Home Medications include Lasix, Coreg -We'll increase Coreg to 12.5 mg twice a day per cards recommendation -Continue Lasix -continue avapro -prn hydralazine -consider renal US  3. Chronic systolic heart failure/lupus associated cardiomyopathy. Chart Review indicates last echo January 2017 revealed EF of 35-40% with diffuse hypokinesis. She also had ischemic evaluation 2015 noted low risk. BNP 129. She reports weight fluctuates daily. She has been taking her when necessary torsemide Evaluated by cardiology  who opined does not appear overtly volume overloaded. Recommend continue by mouth Lasix when necessary torsemide ARB and beta blocker. Of note patient requesting to continue her mobic for lupus. Will hold for now per cardiology recommendations -Increase Coreg as noted above -Daily weights -Monitor intake and output -follow urinalysis  #4. Diabetes type 2. Serum glucose 136 on admission -We will obtain a hemoglobin A1c -SSI -monitor  5. SLE. Medications include methotrexate on Fridays and Actemra injections every two weeks. Managed by [facility] Dr. Redmond Pulling     DVT prophylaxis: scd  Code Status: full  Family Communication: sister at bedside  Disposition Plan: home when ready  Consults called: cardiology  Admission status: obs    Radene Gunning MD [facility]  If 7PM-7AM, please contact night-coverage [email or web address] Password [facility]  07/03/2016, 5:12 PM

## 2016-07-03 NOTE — Telephone Encounter (Signed)
Pt currently admitted to ED.

## 2016-07-03 NOTE — ED Notes (Signed)
Pt complaining of cp primary RN awareq

## 2016-07-03 NOTE — ED Notes (Signed)
Pt made aware of bed assignment 

## 2016-07-03 NOTE — ED Notes (Signed)
Pt denies chest pain

## 2016-07-03 NOTE — ED Notes (Signed)
Attempted iv x 3 unable to obtain order in for IV team

## 2016-07-03 NOTE — Telephone Encounter (Signed)
F/u  Pt had OV w/ Hao for 7/12- had to cancel due to pt being admitted to ED- stated that BP was 180/119 this am- 7/12- wanted to inform RN. Please advise.

## 2016-07-03 NOTE — ED Provider Notes (Signed)
CSN: [id number]     Arrival date & time 07/03/16  1059 History   First MD Initiated Contact with Patient 07/03/16 1127     Chief Complaint  Patient presents with  . Hypertension   (Consider location/radiation/quality/duration/timing/severity/associated sxs/prior Treatment) HPI   Christine Mcdowell is a 25-y/o female with history of lupus, asthma, CHF and dilated cardiomyopathy who presents with acute shortness of breath and blood pressure concern. She started to have exertional dyspnea yesterday and says she had otherwise been doing well in terms of shortness of breath for the past year. She also has had sudden sharp chest pains with exertion and deep inspiration. She found it more difficult than usual to wake up this morning. She also reports a severe headache over her temples. She denies fevers or sick contacts. She saw her PCP yesterday for difficulty with blood pressure control, and pt was told to take 2 valsartan this morning, which she did. She also took a dose of torsemide last night in addition to lasix and says it brought her weight down by 5 lbs, though she denies increased LE swelling. She reports her BP has been "spiking randomly" as high as 180/119, and she has had intermittent blurry vision.   Of note, she reports a history of having a "clot" at wound site after abdominal hysterectomy several years ago and that she was on lovenox until 2 weeks ago while her left ankle was in a cast for recent L ankle injury. She clarified that she was switched from lisinopril to valsartan at the end of June but started taking the new medication at the beginning of July due to issues with insurance approval.   She denies systemic symptoms but has had diarrhea over the weekend but had taken methotrexate.   Past Medical History  Diagnosis Date  . Lupus (systemic lupus erythematosus) ([place])   . Arthritis   . Hypertension   . Asthma   . Fibromyalgia   . CHF (congestive heart failure) ([place])   .  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 Hx of echocardiogram     Echo (9/15):  EF 50-55%, ant HK, Gr 1 DD, mild MR, mild LAE, no effusion  . Hx of cardiovascular stress test     ETT-Myoview (9/15):  No ischemia, EF 52%; NORMAL   Past Surgical History  Procedure Laterality Date  . Knee surgery    . Tubal ligation    . Abdominal hysterectomy  2009   Family History  Problem Relation Age of Onset  . Diabetes Other   . Hypertension Other   . Heart attack Father   . Heart attack Paternal Grandmother    Social History  Substance Use Topics  . Smoking status: Never Smoker   . Smokeless tobacco: Never Used  . Alcohol Use: No   OB History    No data available     Review of Systems  Constitutional: Negative for fever and chills.  Respiratory: Positive for cough (occasional ) and shortness of breath.   Cardiovascular: Positive for chest pain. Negative for leg swelling.  Gastrointestinal: Positive for nausea and diarrhea. Negative for abdominal pain.  Neurological: Positive for headaches.    Allergies  Dilaudid; Erythromycin; Hydromorphone; Iodinated diagnostic agents; and Latex  Home Medications   Prior to Admission medications   Medication Sig Start Date End Date Taking? Authorizing Provider  ACTEMRA 162 MG/0.9ML SOSY Inject 162 mg into the skin every 14 (fourteen) days. 11/09/15  Yes Historical  Provider, MD  aspirin 81 MG chewable tablet Chew 162 mg by mouth every morning.    Yes Historical Provider, MD  carvedilol (COREG) 6.25 MG tablet Take 1 tablet (6.25 mg total) by mouth 2 (two) times daily with a meal. Patient taking differently: Take 6.25-12.5 mg by mouth 2 (two) times daily with a meal. 12.5mg  in the morning and 6.25mg  at night 01/04/16  Yes Scott T Weaver, PA-C  diphenhydrAMINE (BENADRYL) 25 MG tablet Take 75 mg by mouth every 6 (six) hours as needed for allergies.   Yes Historical Provider, MD  folic acid (FOLVITE) 1 MG tablet Take 1 tablet  (1 mg total) by mouth every morning. 01/04/16  Yes Scott Joylene Draft, PA-C  furosemide (LASIX) 40 MG tablet Take 1 tablet (40 mg total) by mouth daily. Patient taking differently: Take 40 mg by mouth 2 (two) times daily.  01/04/16  Yes Scott Joylene Draft, PA-C  hydroxychloroquine (PLAQUENIL) 200 MG tablet Take 1 tablet (200 mg total) by mouth every morning. Patient taking differently: Take 200 mg by mouth 2 (two) times daily.  12/30/15  Yes Robbie Lis, MD  meloxicam (MOBIC) 15 MG tablet Take 1 tablet (15 mg total) by mouth daily as needed. Inflammation or pain 12/30/15  Yes Robbie Lis, MD  methotrexate (RHEUMATREX) 2.5 MG tablet Take 20 mg by mouth once a week. Take 8 tablets by mouth once a week on Friday 02/06/15  Yes Historical Provider, MD  Multiple Vitamin (MULTIVITAMIN WITH MINERALS) TABS Take 1 tablet by mouth every morning.    Yes Historical Provider, MD  potassium chloride SA (K-DUR,KLOR-CON) 20 MEQ tablet Take 2 tablets (40 mEq total) by mouth daily. 05/31/16  Yes Josue Hector, MD  predniSONE (DELTASONE) 20 MG tablet Take 1 tablet (20 mg total) by mouth daily with breakfast. 12/30/15  Yes Robbie Lis, MD  torsemide (DEMADEX) 10 MG tablet Take 1 tablet (10 mg total) by mouth daily as needed (fluid). 01/04/16  Yes Scott T Kathlen Mody, PA-C  valsartan (DIOVAN) 160 MG tablet Take 160 mg by mouth 2 (two) times daily.  06/14/16  Yes Historical Provider, MD  albuterol (ACCUNEB) 1.25 MG/3ML nebulizer solution Take 3 mLs (1.25 mg total) by nebulization every 6 (six) hours as needed for wheezing. 03/04/16   Lacretia Leigh, MD  HUMALOG KWIKPEN 100 UNIT/ML KiwkPen Inject 0-20 Units into the skin 3 (three) times daily as needed (for blood sugar). Sliding scale 02/01/16   Historical Provider, MD  ibuprofen (ADVIL,MOTRIN) 200 MG tablet Take 600 mg by mouth every 6 (six) hours as needed for headache or moderate pain.    Historical Provider, MD  lisinopril (PRINIVIL,ZESTRIL) 20 MG tablet Take 1 tablet (20 mg total) by mouth  daily. Patient not taking: Reported on 07/03/2016 12/30/15   Robbie Lis, MD  loratadine (CLARITIN) 10 MG tablet Take 1 tablet (10 mg total) by mouth daily. Patient not taking: Reported on 07/03/2016 12/30/15   Robbie Lis, MD  ondansetron (ZOFRAN) 4 MG tablet Take 1 tablet (4 mg total) by mouth every 8 (eight) hours as needed for nausea or vomiting. 12/30/15   Robbie Lis, MD  traZODone (DESYREL) 50 MG tablet Take 50 mg by mouth at bedtime as needed for sleep.  02/08/15   Historical Provider, MD   BP 153/113 mmHg  Pulse 89  Temp(Src) 98.6 F (37 C) (Oral)  Resp 20  Wt 83.371 kg  SpO2 99% Physical Exam  Constitutional: She is oriented to person, place,  and time. She appears well-developed and well-nourished. No distress.  HENT:  Head: Normocephalic and atraumatic.  Mouth/Throat: Oropharynx is clear and moist.  Few petechiae of back of throat and tip of tongue.   Eyes: Conjunctivae and EOM are normal. Pupils are equal, round, and reactive to light.  Neck: Normal range of motion. Neck supple.  Cardiovascular: Normal rate, regular rhythm, normal heart sounds and intact distal pulses.   No murmur heard. Pulmonary/Chest: Effort normal and breath sounds normal. No respiratory distress. She has no wheezes. She has no rales.  Abdominal: Soft. Bowel sounds are normal. She exhibits no distension and no mass. There is no tenderness. There is no rebound and no guarding.  Musculoskeletal: Normal range of motion.  Swelling of dorsum of L foot. No LE edema bilaterally.   Neurological: She is alert and oriented to person, place, and time. No cranial nerve deficit.  Skin: Skin is warm and dry. She is not diaphoretic.  Psychiatric: She has a normal mood and affect. Her behavior is normal.  Nursing note and vitals reviewed.  ED Course  Procedures (including critical care time) Labs Review Labs Reviewed  BASIC METABOLIC PANEL - Abnormal; Notable for the following:    Glucose, Bld 136 (*)    Calcium  8.6 (*)    All other components within normal limits  CBC - Abnormal; Notable for the following:    Platelets 125 (*)    All other components within normal limits  D-DIMER, QUANTITATIVE (NOT AT [facility]) - Abnormal; Notable for the following:    D-Dimer, Quant 0.60 (*)    All other components within normal limits  BRAIN NATRIURETIC PEPTIDE - Abnormal; Notable for the following:    B Natriuretic Peptide 129.5 (*)    All other components within normal limits  TROPONIN I  TROPONIN I  CBC  URINALYSIS, ROUTINE W REFLEX MICROSCOPIC (NOT AT [facility])  Randolm Idol, ED    Imaging Review Dg Chest 2 View  07/03/2016  CLINICAL DATA:  Chest pain and short of breath EXAM: CHEST  2 VIEW COMPARISON:  03/04/2016 FINDINGS: Cardiac enlargement without heart failure. Mild left lower lobe airspace disease compatible with atelectasis. No significant effusion. No mass lesion. IMPRESSION: Cardiac enlargement without heart failure Left lower lobe atelectasis. Electronically Signed   By: Franchot Gallo M.D.   On: 07/03/2016 14:49   I have personally reviewed and evaluated these images and lab results as part of my medical decision-making.   EKG Interpretation   Date/Time:  Wednesday July 03 2016 11:06:53 EDT Ventricular Rate:  93 PR Interval:  184 QRS Duration: 82 QT Interval:  376 QTC Calculation: 467 R Axis:   -34 Text Interpretation:  Normal sinus rhythm Possible Left atrial enlargement  Left axis deviation Anterior infarct , age undetermined Abnormal ECG  Confirmed by Carmin Muskrat  MD [id number]) on 07/03/2016 11:46:03 AM      MDM   Final diagnoses:  Hypertension  Lupus (systemic lupus erythematosus) (HCC)   Pt presents with hypertensive urgency and atypical chest pain. Low suspicion for PE with Well's Score of 1, and D-dimer elevated at 0.60 but lower than previous. Patient could be having CHF exacerbation but does not have signs of fluid overload, without LE edema, no crackles on lung exam, and  no evidence of pulmonary congestion on CXR. BNP elevated to 129.5, again lower than high of 533.5 in January 2017. Chest pain continues but is unlikely to represent ACS, with troponins negative x 2 and EKG unchanged from  previous. No anemia on CBC. Headache improved with toradol. Cardiology was consulted and recommended admission to evaluate for secondary causes of severe hypertension and to adjust BP medications. Cardiology will continue to consult.   Olene Floss, MD [facility], PGY-2  [facility], MD 07/03/16 [id number]  Carmin Muskrat, MD 07/04/16 [phone]

## 2016-07-03 NOTE — Consult Note (Signed)
Cardiology Consult    Patient ID: Christine Mcdowell MRN: [id number], DOB/AGE: Jul 15, 1971   Admit date: 07/03/2016 Date of Consult: 07/03/2016  Primary Physician: Christine Greenland, MD Reason for Consult: Chest Pain, Elevated BP Primary Cardiologist: Dr. Johnsie Mcdowell Requesting Provider: Dr. Vanita Mcdowell   History of Present Illness    Christine Mcdowell is a 45 y.o. 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   She reports having increased edema along her lower extremities, abdomen, hands, and shoulders for the past 3 weeks. She took PRN Demedex for the swelling which is now close to baseline. Denies any worsening orthopnea (always sleeps with mild incline, no acute changes), PND, or dyspnea with exertion. Weights at home have been variable. Was 177 lbs when seen in the office on 6/9, at 183 lbs today.  Also starting 3 weeks ago, she started to experience dizziness, occuring at various times. She informed her PCP and started checking her BP daily. She reports her AM values have been going up steadily over the past few weeks with SBP in the 150's last week. Her BP was 171/119 yesterday and improved to 150/100 several hours after taking her medication. She was seen by her PCP the same day and her Lisinopril was switched to Valsartan.   This morning, she was dizzy and had a headache again. Also reported having slurred speech, which has sent resolved. This was very concerning to her, for she reports having four strokes in the past. Her BP was elevated to 180/110 at that time.  She also reports having a shooting pain in her sternal chest for the past two days as well, most notable "when her BP is elevated". The shooting pain can last seconds to minutes and can occur  at rest or with exertion. She was given Toradol with some relief in her symptoms.   While in the ED, labs have shown a WBC of 7.9, Hgb 12.3, platelets 125. Electrolytes WNL. Creatinine 0.78. BNP at 129. D-dimer 0.60. Initial troponin negative. CXR shows cardiac enlargement without heart failure with left lower lobe atelectasis. EKG shows NSR, HR 93, LAD, and TWI in V1 (present on previous tracings).  Last echocardiogram was in 12/2015 and showed an EF of 35-40% with diffuse HK (previous EF was 30-35% in 01/2015). Her last ischemic evaluation was in 08/2014 and was a low-risk NST.  Past Medical History   Past Medical History  Diagnosis Date  . Lupus (systemic lupus erythematosus) ([place])   . Arthritis   . Hypertension   . Asthma   . Fibromyalgia   . CHF (congestive heart failure) ([place])   . 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 Hx of echocardiogram     Echo (9/15):  EF 50-55%, ant HK, Gr 1 DD, mild MR, mild LAE, no effusion  . Hx of cardiovascular stress test     ETT-Myoview (9/15):  No ischemia, EF 52%; NORMAL    Past Surgical History  Procedure Laterality Date  . Knee surgery    . Tubal ligation    . Abdominal hysterectomy  2009     Allergies  Allergies  Allergen Reactions  . Dilaudid [Hydromorphone Hcl] Nausea And Vomiting and Other (See Comments)    Raises blood pressure   . Erythromycin Nausea And Vomiting  .  Hydromorphone Nausea And Vomiting    Other reaction(s): GI Upset (intolerance), Hypertension (intolerance) Raises blood pressure   . Iodinated Diagnostic Agents Other (See Comments)    Flu like symptoms  . Latex Hives    Inpatient Medications      Family History    Family History  Problem Relation Age of Onset  . Diabetes Other   . Hypertension Other   . Heart attack Father   . Heart attack Paternal Grandmother     Social History    Social History   Social History  . Marital Status: Divorced    Spouse Name: N/A  .  Number of Children: N/A  . Years of Education: N/A   Occupational History  . Not on file.   Social History Main Topics  . Smoking status: Never Smoker   . Smokeless tobacco: Never Used  . Alcohol Use: No  . Drug Use: No  . Sexual Activity: Not on file   Other Topics Concern  . Not on file   Social History Narrative     Review of Systems    General:  No chills, fever, night sweats or weight changes.  Cardiovascular:  No dyspnea on exertion, orthopnea, palpitations, paroxysmal nocturnal dyspnea. Positive for chest pain and edema. Dermatological: No rash, lesions/masses Respiratory: No cough, dyspnea Urologic: No hematuria, dysuria Abdominal:   No nausea, vomiting, diarrhea, bright red blood per rectum, melena, or hematemesis Neurologic:  No visual changes, wkns, changes in mental status. Positive for weakness, headaches, dizziness, and slurred speech.  All other systems reviewed and are otherwise negative except as noted above.  Physical Exam    Blood pressure 140/102, pulse 90, temperature 98.6 F (37 C), temperature source Oral, resp. rate 16, weight 183 lb 12.8 oz (83.371 kg), SpO2 98 %.  General: Pleasant, African American female appearing in NAD. Psych: Normal affect. Neuro: Alert and oriented X 3. Moves all extremities spontaneously. HEENT: Normal  Neck: Supple without bruits or JVD. Lungs:  Resp regular and unlabored, CTA without wheezing or rales. Heart: RRR no s3, s4, or murmurs. Tender to palpation along sternum and left pectoral region.  Abdomen: Soft, non-tender, non-distended, BS + x 4.  Extremities: No clubbing or cyanosis. Trace edema bilaterally. DP/PT/Radials 2+ and equal bilaterally.  Labs    Troponin [facility] of Care Test)  Recent Labs  07/03/16 1209  TROPIPOC 0.00   No results for input(s): CKTOTAL, CKMB, TROPONINI in the last 72 hours. Lab Results  Component Value Date   WBC 7.9 07/03/2016   HGB 12.3 07/03/2016   HCT 37.2 07/03/2016   MCV  89.0 07/03/2016   PLT 125* 07/03/2016     Recent Labs Lab 07/03/16 1203  NA 139  K 3.7  CL 108  CO2 22  BUN 16  CREATININE 0.78  CALCIUM 8.6*  GLUCOSE 136*   No results found for: CHOL, HDL, LDLCALC, TRIG Lab Results  Component Value Date   DDIMER 0.60* 07/03/2016     Radiology Studies    Dg Chest 2 View: 07/03/2016  CLINICAL DATA:  Chest pain and short of breath EXAM: CHEST  2 VIEW COMPARISON:  03/04/2016 FINDINGS: Cardiac enlargement without heart failure. Mild left lower lobe airspace disease compatible with atelectasis. No significant effusion. No mass lesion. IMPRESSION: Cardiac enlargement without heart failure Left lower lobe atelectasis. Electronically Signed   By: Franchot Gallo M.D.   On: 07/03/2016 14:49    EKG & Cardiac Imaging    EKG:  NSR, HR 93,  LAD, TWI in V1 (present on previous tracings).  Echocardiogram: 12/2015 Study Conclusions - Left ventricle: The cavity size was mildly dilated. Wall  thickness was increased in a pattern of mild LVH. Systolic  function was moderately reduced. The estimated ejection fraction  was in the range of 35% to 40%. Diffuse hypokinesis. Left  ventricular diastolic function parameters were normal. - Mitral valve: There was mild regurgitation. - Pericardium, extracardiac: A trivial pericardial effusion was  identified posterior to the heart. Features were not consistent  with tamponade physiology.  Assessment & Plan    1. Atypical Chest Pain - reports having a shooting pain in her sternal chest for the past two days, most notable when her BP is elevated. Pain can last seconds to minutes and can occur at rest or with exertion. She was given Toradol with some relief in her symptoms. Pain is reproducible on examination. - Initial troponin negative.  EKG shows NSR, HR 93, LAD, TWI in V1 (present on previous tracings).  - with description of her symptoms overall being atypical, reproducible pain on examination, negative  troponin, and no acute EKG changes, would not pursue further ischemic evaluation at this time. Delta troponin pending.  2. Accelerated HTN - says her BP was 180/110 this AM prior to taking medications. Has been elevated for the past few weeks with SBP in the 150's - 170's. - was 162/112 upon arrival to the ED, currently 140/102 (pain medication is only thing that has been administered.) - would continue ARB (recently switch from Lisinopril to Valsartan by her PCP). Increase Coreg to 12.5mg  BID. Consider addition of Amlodipine 5mg  daily to her regimen.  3. Chronic Systolic CHF/ Lupus-associated Cardiomyopathy - last echocardiogram in 12/2015 and showed an EF of 35-40% with diffuse HK (previous EF was 30-35% in 01/2015). Her last ischemic evaluation was in 08/2014 and was a low-risk NST. - reports having increased edema along her lower extremities, abdomen, hands, and shoulders for the past 3 weeks. Has been taking PRN Torsemide. Does not appear overly volume volume overloaded on physical exam. BNP minimally elevated at 129. - continue PO Lasix, PRN Torsemide, ARB and BB. Would increase Coreg to 12.5mg  BID.  4. Slurred Speech and Dizziness - currently resolved. Reports a history of 4 CVA's. - per PCP and/or admitting team.   Signed, Erma Heritage, PA-C 07/03/2016, 3:19 PM Pager: [phone]  I have seen and examined the patient along with Erma Heritage, PA-C.  I have reviewed the chart, notes and new data.  I agree with PA's note.  Key new complaints: Most prominent complaints are swelling, mild dyspnea. Slurred speech was brief and had some old. Chest pain is highly atypical. Previous attempts at discontinuation of NSAIDs led to severe generalized arthralgia. She denies hematuria or frothy urine Key examination changes: Blood pressure is severely elevated. There is no overt evidence of hypovolemia. Minimal displacement of apical impulse with otherwise normal cardiac exam, no focal  neurological signs Key new findings / data: Creatinine is normal, electrocardiogram is low-risk, cardiac enzymes are normal, minimally elevated d-dimer is a chronic abnormality.  PLAN: Low suspicion for acute coronary event. The chest discomfort pattern is also not typical for pericarditis. Increase carvedilol and add amlodipine. Try to see if we can find a substitute for NSAID. Needs to evaluate for possible secondary causes of severe hypertension including renal artery stenosis or lupus-related glomerulonephritis (check urine for hematuria, proteinuria, casts).  Sanda Klein, MD, [place] [phone] 07/03/2016, 3:56 PM

## 2016-07-04 ENCOUNTER — Observation Stay (HOSPITAL_BASED_OUTPATIENT_CLINIC_OR_DEPARTMENT_OTHER): Payer: BLUE CROSS/BLUE SHIELD

## 2016-07-04 ENCOUNTER — Encounter: Payer: Self-pay | Admitting: Physician Assistant

## 2016-07-04 DIAGNOSIS — I10 Essential (primary) hypertension: Secondary | ICD-10-CM | POA: Diagnosis not present

## 2016-07-04 DIAGNOSIS — R072 Precordial pain: Secondary | ICD-10-CM | POA: Diagnosis not present

## 2016-07-04 DIAGNOSIS — R0789 Other chest pain: Secondary | ICD-10-CM | POA: Diagnosis not present

## 2016-07-04 DIAGNOSIS — I169 Hypertensive crisis, unspecified: Secondary | ICD-10-CM | POA: Diagnosis not present

## 2016-07-04 DIAGNOSIS — M329 Systemic lupus erythematosus, unspecified: Secondary | ICD-10-CM

## 2016-07-04 DIAGNOSIS — R079 Chest pain, unspecified: Secondary | ICD-10-CM

## 2016-07-04 DIAGNOSIS — I160 Hypertensive urgency: Secondary | ICD-10-CM | POA: Diagnosis not present

## 2016-07-04 DIAGNOSIS — I5022 Chronic systolic (congestive) heart failure: Secondary | ICD-10-CM | POA: Diagnosis not present

## 2016-07-04 DIAGNOSIS — I161 Hypertensive emergency: Secondary | ICD-10-CM | POA: Diagnosis not present

## 2016-07-04 LAB — TROPONIN I: Troponin I: <0.03 ng/mL (ref <0.03)

## 2016-07-04 LAB — GLUCOSE, CAPILLARY
Glucose-Capillary: 94 mg/dL (ref 65–99)
Glucose-Capillary: 118 mg/dL — ABNORMAL HIGH (ref 65–99)

## 2016-07-04 LAB — CBC
HCT: 36.2 % (ref 36.0–46.0)
Hemoglobin: 11.6 g/dL — ABNORMAL LOW (ref 12.0–15.0)
MCH: 28.9 pg (ref 26.0–34.0)
MCHC: 32 g/dL (ref 30.0–36.0)
MCV: 90 fL (ref 78.0–100.0)
Platelets: 159 10*3/uL (ref 150–400)
RBC: 4.02 MIL/uL (ref 3.87–5.11)
RDW: 14.8 % (ref 11.5–15.5)
WBC: 9.1 10*3/uL (ref 4.0–10.5)

## 2016-07-04 LAB — BASIC METABOLIC PANEL
Anion gap: 9 (ref 5–15)
BUN: 16 mg/dL (ref 6–20)
CO2: 25 mmol/L (ref 22–32)
Calcium: 8.4 mg/dL — ABNORMAL LOW (ref 8.9–10.3)
Chloride: 103 mmol/L (ref 101–111)
Creatinine, Ser: 0.83 mg/dL (ref 0.44–1.00)
GFR calc Af Amer: >60 mL/min (ref >60)
GFR calc non Af Amer: >60 mL/min (ref >60)
Glucose, Bld: 117 mg/dL — ABNORMAL HIGH (ref 65–99)
Potassium: 3.8 mmol/L (ref 3.5–5.1)
Sodium: 137 mmol/L (ref 135–145)

## 2016-07-04 MED ORDER — FUROSEMIDE 40 MG PO TABS: 40.0000 mg | ORAL_TABLET | Freq: Two times a day (BID) | ORAL | Status: DC

## 2016-07-04 MED ORDER — CARVEDILOL 12.5 MG PO TABS: 12.5000 mg | ORAL_TABLET | Freq: Two times a day (BID) | ORAL | Status: DC

## 2016-07-04 NOTE — Progress Notes (Signed)
Orders received for pt discharge.  Discharge summary printed and reviewed with pt.  Explained medication regimen, and pt had no further questions at this time.  IV removed and site remains clean, dry, intact.  Telemetry removed.  Pt in stable condition and awaiting transport. 

## 2016-07-04 NOTE — Progress Notes (Signed)
*  PRELIMINARY RESULTS* Vascular Ultrasound Renal artery duplex has been completed.  Preliminary findings: No evidence of renal artery stenosis.  Landry Mellow, RDMS, RVT  07/04/2016, 9:13 AM

## 2016-07-04 NOTE — Progress Notes (Signed)
Patient Name: Christine Mcdowell Date of Encounter: 07/04/2016  Principal Problem:   Chest pain Active Problems:   SLE (systemic lupus erythematosus) (HCC)   Chronic systolic heart failure (HCC)   Type 2 diabetes mellitus ([place])   Hypertensive crisis   Hypertensive urgency   Thrombocytopenia (HCC)   Lupus (systemic lupus erythematosus) ([place])   Diabetes mellitus with complication ([place])   Hypertensive emergency   Length of Stay:   SUBJECTIVE  Feels much better. BP almost in normal range. Renal duplex US preliminary negative for renal artery stenosis. UA bland.  CURRENT MEDS . aspirin  162 mg Oral Daily  . carvedilol  12.5 mg Oral BID WC  . enoxaparin (LOVENOX) injection  40 mg Subcutaneous Q24H  . furosemide  40 mg Oral BID  . hydroxychloroquine  200 mg Oral BID  . insulin aspart  0-5 Units Subcutaneous QHS  . insulin aspart  0-9 Units Subcutaneous TID WC  . irbesartan  150 mg Oral Daily  . multivitamin with minerals  1 tablet Oral Daily  . potassium chloride SA  40 mEq Oral Daily  . predniSONE  20 mg Oral Q breakfast    OBJECTIVE   Intake/Output Summary (Last 24 hours) at 07/04/16 1012 Last data filed at 07/04/16 0812  Gross per 24 hour  Intake    360 ml  Output   1150 ml  Net   -790 ml   Filed Weights   07/03/16 1207 07/03/16 1701 07/04/16 0501  Weight: 83.371 kg (183 lb 12.8 oz) 83.1 kg (183 lb 3.2 oz) 82.781 kg (182 lb 8 oz)    PHYSICAL EXAM Filed Vitals:   07/03/16 1701 07/03/16 2001 07/04/16 0022 07/04/16 0501  BP: 167/110 141/92 123/68 141/87  Pulse: 95 86 77 81  Temp: 98.6 F (37 C) 97.9 F (36.6 C) 97.9 F (36.6 C) 98.1 F (36.7 C)  TempSrc: Oral Oral Oral Oral  Resp: 18 18 18 18   Height: 5\' 7"  (1.702 m)     Weight: 83.1 kg (183 lb 3.2 oz)   82.781 kg (182 lb 8 oz)  SpO2: 100% 100% 99% 100%   General: Alert, oriented x3, no distress Head: no evidence of trauma, PERRL, EOMI, no exophtalmos or lid lag, no myxedema, no xanthelasma; normal  ears, nose and oropharynx Neck: normal jugular venous pulsations and no hepatojugular reflux; brisk carotid pulses without delay and no carotid bruits Chest: clear to auscultation, no signs of consolidation by percussion or palpation, normal fremitus, symmetrical and full respiratory excursions Cardiovascular: normal position and quality of the apical impulse, regular rhythm, normal first and second heart sounds, no rubs or gallops, no murmur Abdomen: no tenderness or distention, no masses by palpation, no abnormal pulsatility or arterial bruits, normal bowel sounds, no hepatosplenomegaly Extremities: no clubbing, cyanosis or edema; 2+ radial, ulnar and brachial pulses bilaterally; 2+ right femoral, posterior tibial and dorsalis pedis pulses; 2+ left femoral, posterior tibial and dorsalis pedis pulses; no subclavian or femoral bruits Neurological: grossly nonfocal  LABS  CBC  Recent Labs  07/03/16 0021 07/03/16 1203  WBC 9.1 7.9  HGB 11.6* 12.3  HCT 36.2 37.2  MCV 90.0 89.0  PLT 159 0000000*   Basic Metabolic Panel  Recent Labs  07/03/16 1203 07/04/16 0214  NA 139 137  K 3.7 3.8  CL 108 103  CO2 22 25  GLUCOSE 136* 117*  BUN 16 16  CREATININE 0.78 0.83  CALCIUM 8.6* 8.4*   Liver Function Tests No results for input(s): AST, ALT,  ALKPHOS, BILITOT, PROT, ALBUMIN in the last 72 hours. No results for input(s): LIPASE, AMYLASE in the last 72 hours. Cardiac Enzymes  Recent Labs  07/03/16 1505 07/03/16 1821 07/04/16 0214  TROPONINI <0.03 0.03* <0.03   BNP Invalid input(s): POCBNP D-Dimer  Recent Labs  07/03/16 1300  DDIMER 0.60*    Radiology Studies Imaging results have been reviewed and Dg Chest 2 View  07/03/2016  CLINICAL DATA:  Chest pain and short of breath EXAM: CHEST  2 VIEW COMPARISON:  03/04/2016 FINDINGS: Cardiac enlargement without heart failure. Mild left lower lobe airspace disease compatible with atelectasis. No significant effusion. No mass lesion.  IMPRESSION: Cardiac enlargement without heart failure Left lower lobe atelectasis. Electronically Signed   By: Franchot Gallo M.D.   On: 07/03/2016 14:49    TELE NSR   ASSESSMENT AND PLAN  No evidence of renal artery stenosis or glomerulonephritis. BP much improved on increased carvedilol dose and after formulary switch from valsartan to irbesartan. OK to DC on current carvedilol dose and back on valsartan 160 mg BID, but if BP rises again, it may mean she responds better to irbesartan and would switch her to it. Would wait another week or two before increasing carvedilol to target dose of 25 mg BID, useful for her cardiomyopathy in maximum tolerated dose.   Sanda Klein, MD, [facility] [phone] office [phone] pager 07/04/2016 10:12 AM

## 2016-07-04 NOTE — Discharge Summary (Signed)
Discharge Summary  Christine Mcdowell [id number] DOB: [birth year]  PCP: Maximino Greenland, MD  Admit date: 07/03/2016 Discharge date: 07/04/2016   Recommendations for Outpatient Follow-up:  1. Cardiology Dr. Johnsie Cancel 2 weeks, earlier if hypertensive.   Discharge Diagnoses:  Active Hospital Problems   Diagnosis Date Noted  . Chest pain 07/03/2016  . Hypertensive crisis 07/03/2016  . Hypertensive urgency 07/03/2016  . Thrombocytopenia ([place]) 07/03/2016  . Lupus (systemic lupus erythematosus) ([place])   . Diabetes mellitus with complication ([place])   . Hypertensive emergency   . Type 2 diabetes mellitus ([place]) 12/26/2015  . Chronic systolic heart failure ([place]) 08/18/2014  . SLE (systemic lupus erythematosus) ([place]) 01/26/2014    Resolved Hospital Problems   Diagnosis Date Noted Date Resolved  No resolved problems to display.    Discharge Condition: Stable   Diet recommendation: Cardiac   Filed Vitals:   07/04/16 0501 07/04/16 1138  BP: 141/87 131/80  Pulse: 81 90  Temp: 98.1 F (36.7 C) 98.4 F (36.9 C)  Resp: 18 18    History of present illness:  89 female with lupus, HTN, fiboromyalgia presented with hypertensive urgency.   Hospital Course:  Principal Problem:   Chest pain Active Problems:   SLE (systemic lupus erythematosus) (HCC)   Chronic systolic heart failure (HCC)   Type 2 diabetes mellitus (HCC)   Hypertensive crisis   Hypertensive urgency   Thrombocytopenia (HCC)   Lupus (systemic lupus erythematosus) ([place])   Diabetes mellitus with complication ([place])   Hypertensive emergency  Admitted and troponins trended. Home coreg was increased to 12.5 bid. Continued lasix, avapro, renal arterial US without evidence of renal artery stenosis. Feels great. Home today per cardiology with increased Coreg dose.  Procedures:  US renal arterial 7/12   Consultations:  cardiology   Discharge Exam: BP 131/80 mmHg  Pulse 90  Temp(Src) 98.4 F (36.9 C) (Oral)  Resp 18   Ht 5\' 7"  (1.702 m)  Wt 82.781 kg (182 lb 8 oz)  BMI 28.58 kg/m2  SpO2 100% General:  Alert, oriented, calm, in no acute distress  Eyes: pupils round and reactive to light and accomodation, clear sclerea Neck: supple, no masses, trachea mildline  Cardiovascular: RRR, no murmurs or rubs, no peripheral edema  Respiratory: clear to auscultation bilaterally, no wheezes, no crackles  Abdomen: soft, nontender, nondistended, normal bowel tones heard  Skin: dry, no rashes  Musculoskeletal: no joint effusions, normal range of motion  Psychiatric: appropriate affect, normal speech  Neurologic: extraocular muscles intact, clear speech, moving all extremities with intact sensorium    Discharge Instructions You were cared for by a hospitalist during your hospital stay. If you have any questions about your discharge medications or the care you received while you were in the hospital after you are discharged, you can call the unit and asked to speak with the hospitalist on call if the hospitalist that took care of you is not available. Once you are discharged, your primary care physician will handle any further medical issues. Please note that NO REFILLS for any discharge medications will be authorized once you are discharged, as it is imperative that you return to your primary care physician (or establish a relationship with a primary care physician if you do not have one) for your aftercare needs so that they can reassess your need for medications and monitor your lab values.  Discharge Instructions    Diet - low sodium heart healthy    Complete by:  As directed  Increase activity slowly    Complete by:  As directed             Medication List    STOP taking these medications        lisinopril 20 MG tablet  Commonly known as:  PRINIVIL,ZESTRIL     loratadine 10 MG tablet  Commonly known as:  CLARITIN      TAKE these medications        ACTEMRA 162 MG/0.9ML Sosy  Generic drug:   Tocilizumab  Inject 162 mg into the skin every 14 (fourteen) days.     albuterol 1.25 MG/3ML nebulizer solution  Commonly known as:  ACCUNEB  Take 3 mLs (1.25 mg total) by nebulization every 6 (six) hours as needed for wheezing.     aspirin 81 MG chewable tablet  Chew 162 mg by mouth every morning.     carvedilol 12.5 MG tablet  Commonly known as:  COREG  Take 1 tablet (12.5 mg total) by mouth 2 (two) times daily with a meal.     diphenhydrAMINE 25 MG tablet  Commonly known as:  BENADRYL  Take 75 mg by mouth every 6 (six) hours as needed for allergies.     folic acid 1 MG tablet  Commonly known as:  FOLVITE  Take 1 tablet (1 mg total) by mouth every morning.     furosemide 40 MG tablet  Commonly known as:  LASIX  Take 1 tablet (40 mg total) by mouth 2 (two) times daily.     HUMALOG KWIKPEN 100 UNIT/ML KiwkPen  Generic drug:  insulin lispro  Inject 0-20 Units into the skin 3 (three) times daily as needed (for blood sugar). Sliding scale     hydroxychloroquine 200 MG tablet  Commonly known as:  PLAQUENIL  Take 1 tablet (200 mg total) by mouth every morning.     ibuprofen 200 MG tablet  Commonly known as:  ADVIL,MOTRIN  Take 600 mg by mouth every 6 (six) hours as needed for headache or moderate pain.     meloxicam 15 MG tablet  Commonly known as:  MOBIC  Take 1 tablet (15 mg total) by mouth daily as needed. Inflammation or pain     methotrexate 2.5 MG tablet  Commonly known as:  RHEUMATREX  Take 20 mg by mouth once a week. Take 8 tablets by mouth once a week on Friday     multivitamin with minerals Tabs tablet  Take 1 tablet by mouth every morning.     ondansetron 4 MG tablet  Commonly known as:  ZOFRAN  Take 1 tablet (4 mg total) by mouth every 8 (eight) hours as needed for nausea or vomiting.     potassium chloride SA 20 MEQ tablet  Commonly known as:  K-DUR,KLOR-CON  Take 2 tablets (40 mEq total) by mouth daily.     predniSONE 20 MG tablet  Commonly known as:   DELTASONE  Take 1 tablet (20 mg total) by mouth daily with breakfast.     torsemide 10 MG tablet  Commonly known as:  DEMADEX  Take 1 tablet (10 mg total) by mouth daily as needed (fluid).     traZODone 50 MG tablet  Commonly known as:  DESYREL  Take 50 mg by mouth at bedtime as needed for sleep.     valsartan 160 MG tablet  Commonly known as:  DIOVAN  Take 160 mg by mouth 2 (two) times daily.       Allergies  Allergen Reactions  .  Other Other (See Comments)    Skin Prep "makes my skin peel off"  . Dilaudid [Hydromorphone Hcl] Nausea And Vomiting and Other (See Comments)    Raises blood pressure   . Erythromycin Nausea And Vomiting  . Hydromorphone Nausea And Vomiting    Other reaction(s): GI Upset (intolerance), Hypertension (intolerance) Raises blood pressure   . Iodinated Diagnostic Agents Other (See Comments)    Flu like symptoms  . Latex Hives       Follow-up Information    Follow up with Maximino Greenland, MD. Go on 07/08/2016.   Specialty:  Internal Medicine   Why:  @11 :45am   Contact information:   [address] [place] [address] [phone]        The results of significant diagnostics from this hospitalization (including imaging, microbiology, ancillary and laboratory) are listed below for reference.    Significant Diagnostic Studies: Dg Chest 2 View  07/03/2016  CLINICAL DATA:  Chest pain and short of breath EXAM: CHEST  2 VIEW COMPARISON:  03/04/2016 FINDINGS: Cardiac enlargement without heart failure. Mild left lower lobe airspace disease compatible with atelectasis. No significant effusion. No mass lesion. IMPRESSION: Cardiac enlargement without heart failure Left lower lobe atelectasis. Electronically Signed   By: Franchot Gallo M.D.   On: 07/03/2016 14:49    Microbiology: No results found for this or any previous visit (from the past 240 hour(s)).   Labs: Basic Metabolic Panel:  Recent Labs Lab 07/03/16 1203 07/04/16 0214    NA 139 137  K 3.7 3.8  CL 108 103  CO2 22 25  GLUCOSE 136* 117*  BUN 16 16  CREATININE 0.78 0.83  CALCIUM 8.6* 8.4*   Liver Function Tests: No results for input(s): AST, ALT, ALKPHOS, BILITOT, PROT, ALBUMIN in the last 168 hours. No results for input(s): LIPASE, AMYLASE in the last 168 hours. No results for input(s): AMMONIA in the last 168 hours. CBC:  Recent Labs Lab 07/03/16 0021 07/03/16 1203  WBC 9.1 7.9  HGB 11.6* 12.3  HCT 36.2 37.2  MCV 90.0 89.0  PLT 159 125*   Cardiac Enzymes:  Recent Labs Lab 07/03/16 1505 07/03/16 1821 07/04/16 0214  TROPONINI <0.03 0.03* <0.03   BNP: BNP (last 3 results)  Recent Labs  03/04/16 0830 03/27/16 0847 07/03/16 1300  BNP 79.3 99.6 129.5*    ProBNP (last 3 results) No results for input(s): PROBNP in the last 8760 hours.  CBG:  Recent Labs Lab 07/03/16 1706 07/03/16 2107 07/04/16 0610 07/04/16 1114  GLUCAP 100* 106* 94 118*    Time spent: 32 minutes were spent in preparing this discharge including medication reconciliation, counseling, and coordination of care.  Signed:  Mir Marry Guan  [facility] 07/04/2016, 2:37 PM

## 2016-07-08 DIAGNOSIS — I110 Hypertensive heart disease with heart failure: Secondary | ICD-10-CM | POA: Diagnosis not present

## 2016-07-13 ENCOUNTER — Other Ambulatory Visit: Payer: Self-pay | Admitting: Nurse Practitioner

## 2016-07-13 DIAGNOSIS — M79605 Pain in left leg: Secondary | ICD-10-CM

## 2016-09-09 DIAGNOSIS — Z7952 Long term (current) use of systemic steroids: Secondary | ICD-10-CM | POA: Diagnosis not present

## 2016-09-09 DIAGNOSIS — M4602 Spinal enthesopathy, cervical region: Secondary | ICD-10-CM | POA: Diagnosis not present

## 2016-09-09 DIAGNOSIS — M329 Systemic lupus erythematosus, unspecified: Secondary | ICD-10-CM | POA: Diagnosis not present

## 2016-09-09 DIAGNOSIS — Z79899 Other long term (current) drug therapy: Secondary | ICD-10-CM | POA: Diagnosis not present

## 2016-09-09 DIAGNOSIS — Z885 Allergy status to narcotic agent status: Secondary | ICD-10-CM | POA: Diagnosis not present

## 2016-09-09 DIAGNOSIS — M358 Other specified systemic involvement of connective tissue: Secondary | ICD-10-CM | POA: Diagnosis not present

## 2016-09-09 DIAGNOSIS — Z881 Allergy status to other antibiotic agents status: Secondary | ICD-10-CM | POA: Diagnosis not present

## 2016-09-09 DIAGNOSIS — M542 Cervicalgia: Secondary | ICD-10-CM | POA: Diagnosis not present

## 2016-09-09 DIAGNOSIS — M0579 Rheumatoid arthritis with rheumatoid factor of multiple sites without organ or systems involvement: Secondary | ICD-10-CM | POA: Diagnosis not present

## 2016-10-01 ENCOUNTER — Emergency Department (HOSPITAL_COMMUNITY)
Admission: EM | Admit: 2016-10-01 | Discharge: 2016-10-01 | Disposition: A | Discharge status: Home / Self Care | Payer: BLUE CROSS/BLUE SHIELD | Attending: Emergency Medicine | Admitting: Emergency Medicine

## 2016-10-01 ENCOUNTER — Emergency Department (HOSPITAL_COMMUNITY): Payer: BLUE CROSS/BLUE SHIELD

## 2016-10-01 ENCOUNTER — Encounter (HOSPITAL_COMMUNITY): Payer: Self-pay | Admitting: Emergency Medicine

## 2016-10-01 DIAGNOSIS — Z8673 Personal history of transient ischemic attack (TIA), and cerebral infarction without residual deficits: Secondary | ICD-10-CM | POA: Insufficient documentation

## 2016-10-01 DIAGNOSIS — Z8541 Personal history of malignant neoplasm of cervix uteri: Secondary | ICD-10-CM | POA: Insufficient documentation

## 2016-10-01 DIAGNOSIS — Z91040 Latex allergy status: Secondary | ICD-10-CM | POA: Diagnosis not present

## 2016-10-01 DIAGNOSIS — I509 Heart failure, unspecified: Secondary | ICD-10-CM | POA: Diagnosis not present

## 2016-10-01 DIAGNOSIS — E119 Type 2 diabetes mellitus without complications: Secondary | ICD-10-CM | POA: Diagnosis not present

## 2016-10-01 DIAGNOSIS — J45909 Unspecified asthma, uncomplicated: Secondary | ICD-10-CM | POA: Diagnosis not present

## 2016-10-01 DIAGNOSIS — I110 Hypertensive heart disease with heart failure: Secondary | ICD-10-CM | POA: Insufficient documentation

## 2016-10-01 DIAGNOSIS — R079 Chest pain, unspecified: Secondary | ICD-10-CM | POA: Diagnosis not present

## 2016-10-01 LAB — CBC
HCT: 40.3 % (ref 36.0–46.0)
Hemoglobin: 12.7 g/dL (ref 12.0–15.0)
MCH: 28.5 pg (ref 26.0–34.0)
MCHC: 31.5 g/dL (ref 30.0–36.0)
MCV: 90.4 fL (ref 78.0–100.0)
Platelets: 219 10*3/uL (ref 150–400)
RBC: 4.46 MIL/uL (ref 3.87–5.11)
RDW: 13.5 % (ref 11.5–15.5)
WBC: 10.1 10*3/uL (ref 4.0–10.5)

## 2016-10-01 LAB — BASIC METABOLIC PANEL
Anion gap: 6 (ref 5–15)
BUN: 9 mg/dL (ref 6–20)
CO2: 26 mmol/L (ref 22–32)
Calcium: 8.7 mg/dL — ABNORMAL LOW (ref 8.9–10.3)
Chloride: 105 mmol/L (ref 101–111)
Creatinine, Ser: 0.91 mg/dL (ref 0.44–1.00)
GFR calc Af Amer: >60 mL/min (ref >60)
GFR calc non Af Amer: >60 mL/min (ref >60)
Glucose, Bld: 106 mg/dL — ABNORMAL HIGH (ref 65–99)
Potassium: 3.5 mmol/L (ref 3.5–5.1)
Sodium: 137 mmol/L (ref 135–145)

## 2016-10-01 LAB — MAGNESIUM: Magnesium: 1.8 mg/dL (ref 1.7–2.4)

## 2016-10-01 LAB — I-STAT TROPONIN, ED
Troponin i, poc: 0 ng/mL (ref 0.00–0.08)
Troponin i, poc: 0 ng/mL (ref 0.00–0.08)

## 2016-10-01 LAB — BRAIN NATRIURETIC PEPTIDE: B Natriuretic Peptide: 364.6 pg/mL — ABNORMAL HIGH (ref 0.0–100.0)

## 2016-10-01 MED ORDER — FUROSEMIDE 10 MG/ML IJ SOLN
60.0000 mg | Freq: Once | INTRAMUSCULAR | Status: AC
  Administered 2016-10-01: 60 mg via INTRAVENOUS
  Filled 2016-10-01: qty 6

## 2016-10-01 MED ORDER — ASPIRIN 81 MG PO CHEW
162.0000 mg | CHEWABLE_TABLET | Freq: Once | ORAL | Status: AC
  Administered 2016-10-01: 162 mg via ORAL
  Filled 2016-10-01: qty 2

## 2016-10-01 NOTE — ED Triage Notes (Signed)
Pt sts CP, hypertension and retaining fluid; pt sts pain in mid sternal area

## 2016-10-01 NOTE — Discharge Instructions (Signed)
Into you take your Lasix and torsemide, follow with your cardiologist in the next 2 days. Do not hesitate to return to the emergency department for any new, worsening or concerning symptoms.

## 2016-10-01 NOTE — ED Provider Notes (Signed)
[place] DEPT Provider Note   CSN: [id number] Arrival date & time: 10/01/16  1109     History   Chief Complaint Chief Complaint  Patient presents with  . Chest Pain  . Hypertension    HPI  Blood pressure 136/93, pulse 88, temperature 98.4 F (36.9 C), temperature source Oral, resp. rate 18, SpO2 100 %.  Christine Mcdowell is a 45 y.o. 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 followed by 2 nose bleeds, she has also having dizzyspells and chest pain. She states that this chest pain as retrosternal radiating to the left arm, described as sharp and stabbing and lasting only a few minutes and resolving spontaneously, not exacerbated by exertion, deep breathing or palpation. She's never had chest pain like this before. She states that the dizzy spells or like lightheadedness and feeling like she's given a pass out with no vertigo. Of note, she's had significantly increasing peripheral edema with very swollen lower extremities and face, she normally weighs 184 but she was weighing 200 pounds, she's been compliant with her Lasix and low salt diet, she's been taking torsemide daily she does have a dry cough and worsening orthopnea states that she has to sleep at a 90 angle with a airline pillow to hold her head up, this is worsening over the last several weeks. She denies fevers, chills, history of DVT/PE, recent immobilizations, focal calf pain.   Cards: Johnsie Cancel  HPI  Past Medical History:  Diagnosis Date  . Anemia   . Anginal pain ([place])   . Asthma   . Cervical cancer ([place])   . CHF (congestive heart failure) ([place])   . Diabetes mellitus without complication ([place])    steroid induced  . Discoid lupus   . Fibromyalgia   . History of blood transfusion "several"   "related to anemia; had some w/hysterectomy also"  . Hx of  cardiovascular stress test    ETT-Myoview (9/15):  No ischemia, EF 52%; NORMAL  . Hx of echocardiogram    Echo (9/15):  EF 50-55%, ant HK, Gr 1 DD, mild MR, mild LAE, no effusion  . Hypertension   . Iron deficiency anemia    h/o iron transfusions  . Lupus (systemic lupus erythematosus) ([place])   . Migraine    "a few/year" (07/03/2016)  . Pneumonia 12/2015  . RA (rheumatoid arthritis) ([place])    "all over" (07/03/2016)  . Sickle cell trait ([place])   . Stroke ([place]) 2014 X 1; 2015 X 2; 2016 X 1;    "right side of face more relaxed than the other; rare speech hesitation" (07/03/2016)    Patient Active Problem List   Diagnosis Date Noted  . Chest pain 07/03/2016  . Hypertensive crisis 07/03/2016  . Hypertensive urgency 07/03/2016  . Thrombocytopenia ([place]) 07/03/2016  . Lupus (systemic lupus erythematosus) ([place])   . Diabetes mellitus with complication ([place])   . Hypertensive emergency   . Anxiety state   . GERD (gastroesophageal reflux disease) 12/28/2015  . Acute on chronic systolic congestive heart failure ([place])   . Essential hypertension 12/26/2015  . Type 2 diabetes mellitus ([place]) 12/26/2015  . Acute on chronic systolic CHF (congestive heart failure), NYHA class 3 ([place]) 12/26/2015  . Chronic systolic heart failure ([place]) 08/18/2014  . Cardiomyopathy, dilated ([place]) 01/27/2014  . Hypokalemia 01/27/2014  . Shortness of breath 01/26/2014  . SLE (systemic lupus erythematosus) ([place]) 01/26/2014  Past Surgical History:  Procedure Laterality Date  . ABDOMINAL HYSTERECTOMY  2009  . ABDOMINAL WOUND DEHISCENCE  2009  . DILATION AND CURETTAGE OF UTERUS  1991  . HEMATOMA EVACUATION  2009   abdomen  . INCISE AND DRAIN ABCESS  2009 X 2   "abdomen after hysterectomy"  . KNEE ARTHROSCOPY Right 1997  . TUBAL LIGATION  1996    OB History    No data available       Home Medications    Prior to Admission medications   Medication Sig Start Date End Date Taking? Authorizing Provider    ACTEMRA 162 MG/0.9ML SOSY Inject 162 mg into the skin every 14 (fourteen) days. Takes every other FRIDAY 11/09/15  Yes Historical Provider, MD  albuterol (ACCUNEB) 1.25 MG/3ML nebulizer solution Take 3 mLs (1.25 mg total) by nebulization every 6 (six) hours as needed for wheezing. 03/04/16  Yes Lacretia Leigh, MD  albuterol (PROVENTIL HFA;VENTOLIN HFA) 108 (90 Base) MCG/ACT inhaler Inhale 2 puffs into the lungs daily as needed for shortness of breath.   Yes Historical Provider, MD  aspirin 81 MG chewable tablet Chew 162 mg by mouth every morning.    Yes Historical Provider, MD  BREO ELLIPTA 100-25 MCG/INH AEPB Inhale 1 puff into the lungs daily. 07/16/16  Yes Historical Provider, MD  carvedilol (COREG) 12.5 MG tablet Take 1 tablet (12.5 mg total) by mouth 2 (two) times daily with a meal. 07/04/16  Yes Mir Marry Guan, MD  folic acid (FOLVITE) 1 MG tablet Take 1 tablet (1 mg total) by mouth every morning. 01/04/16  Yes Scott Joylene Draft, PA-C  furosemide (LASIX) 40 MG tablet Take 1 tablet (40 mg total) by mouth 2 (two) times daily. 07/04/16  Yes Mir Marry Guan, MD  hydroxychloroquine (PLAQUENIL) 200 MG tablet Take 1 tablet (200 mg total) by mouth every morning. Patient taking differently: Take 200 mg by mouth 2 (two) times daily.  12/30/15  Yes Robbie Lis, MD  meloxicam (MOBIC) 15 MG tablet Take 1 tablet (15 mg total) by mouth daily as needed. Inflammation or pain Patient taking differently: Take 15 mg by mouth daily. Inflammation or pain 12/30/15  Yes Robbie Lis, MD  methotrexate (RHEUMATREX) 2.5 MG tablet Take 20 mg by mouth once a week. Take 8 tablets by mouth once a week on Friday 02/06/15  Yes Historical Provider, MD  Multiple Vitamin (MULTIVITAMIN WITH MINERALS) TABS Take 1 tablet by mouth every morning.    Yes Historical Provider, MD  ondansetron (ZOFRAN) 4 MG tablet Take 1 tablet (4 mg total) by mouth every 8 (eight) hours as needed for nausea or vomiting. 12/30/15  Yes Robbie Lis,  MD  potassium chloride SA (K-DUR,KLOR-CON) 20 MEQ tablet Take 2 tablets (40 mEq total) by mouth daily. 05/31/16  Yes Josue Hector, MD  predniSONE (DELTASONE) 20 MG tablet Take 1 tablet (20 mg total) by mouth daily with breakfast. 12/30/15  Yes Robbie Lis, MD  torsemide (DEMADEX) 10 MG tablet Take 1 tablet (10 mg total) by mouth daily as needed (fluid). 01/04/16  Yes Scott Joylene Draft, PA-C  traMADol (ULTRAM) 50 MG tablet Take 50 mg by mouth every 6 (six) hours as needed for pain. 09/26/16  Yes Historical Provider, MD  traZODone (DESYREL) 50 MG tablet Take 50 mg by mouth at bedtime as needed for sleep.  02/08/15  Yes Historical Provider, MD  valsartan (DIOVAN) 160 MG tablet Take 160 mg by mouth 2 (two) times daily.  06/14/16  Yes Historical Provider, MD    Family History Family History  Problem Relation Age of Onset  . Diabetes Other   . Hypertension Other   . Heart attack Father   . Heart attack Paternal Grandmother     Social History Social History  Substance Use Topics  . Smoking status: Never Smoker  . Smokeless tobacco: Never Used  . Alcohol use No     Allergies   Hydromorphone; Erythromycin; Iodinated diagnostic agents; Latex; and Other   Review of Systems Review of Systems  10 systems reviewed and found to be negative, except as noted in the HPI.  Physical Exam Updated Vital Signs BP 146/92   Pulse 91   Temp 98.4 F (36.9 C) (Oral)   Resp 23   Wt 87.9 kg   SpO2 97%   BMI 30.35 kg/m   Physical Exam  Constitutional: She is oriented to person, place, and time. She appears well-developed and well-nourished. No distress.  HENT:  Head: Normocephalic.  Mouth/Throat: Oropharynx is clear and moist.  Eyes: Conjunctivae are normal.  Neck: Normal range of motion. No JVD present. No tracheal deviation present.  Cardiovascular: Normal rate, regular rhythm and intact distal pulses.   Radial pulse equal bilaterally  Pulmonary/Chest: Effort normal and breath sounds normal. No  stridor. No respiratory distress. She has no wheezes. She has no rales. She exhibits no tenderness.  Abdominal: Soft. She exhibits no distension and no mass. There is no tenderness. There is no rebound and no guarding.  Musculoskeletal: Normal range of motion. She exhibits edema. She exhibits no tenderness.  No calf asymmetry, superficial collaterals, palpable cords, edema, Homans sign negative bilaterally.   2+ bilateral lower extremity pitting edema to midshin  Neurological: She is alert and oriented to person, place, and time.  Skin: Skin is warm. She is not diaphoretic.  Psychiatric: She has a normal mood and affect.  Nursing note and vitals reviewed.    ED Treatments / Results  Labs (all labs ordered are listed, but only abnormal results are displayed) Labs Reviewed  BASIC METABOLIC PANEL - Abnormal; Notable for the following:       Result Value   Glucose, Bld 106 (*)    Calcium 8.7 (*)    All other components within normal limits  BRAIN NATRIURETIC PEPTIDE - Abnormal; Notable for the following:    B Natriuretic Peptide 364.6 (*)    All other components within normal limits  CBC  MAGNESIUM  I-STAT TROPOININ, ED  I-STAT TROPOININ, ED    EKG  EKG Interpretation  Date/Time:  Tuesday October 01 2016 11:16:19 EDT Ventricular Rate:  100 PR Interval:  186 QRS Duration: 82 QT Interval:  388 QTC Calculation: 500 R Axis:   -22 Text Interpretation:  Normal sinus rhythm Possible Left atrial enlargement Prolonged QT Abnormal ECG since last tracing no significant change Confirmed by Eulis Foster  MD, ELLIOTT [id number]) on 10/01/2016 8:26:44 PM       Radiology Dg Chest 2 View  Result Date: 10/01/2016 CLINICAL DATA:  Chest pain and dizziness since yesterday. Subjective fluid retention recently. History of CHF, asthma, pneumonia, previous CVA. EXAM: CHEST  2 VIEW COMPARISON:  PA and lateral chest x-ray of July 03, 2016 FINDINGS: The lungs are adequately inflated. There is no focal  infiltrate. The cardiac silhouette is mildly enlarged. The pulmonary vascularity is prominent centrally but stable. The mediastinum is normal in width. There is a small left pleural effusion. The observed bony thorax is unremarkable. The gas pattern in  the upper abdomen is normal. IMPRESSION: Mild cardiomegaly. Small left pleural effusion. Central pulmonary vascular prominence without pulmonary edema. No evidence of pneumonia. Electronically Signed   By: David  Martinique M.D.   On: 10/01/2016 11:51    Procedures Procedures (including critical care time)  Medications Ordered in ED Medications  furosemide (LASIX) injection 60 mg (60 mg Intravenous Given 10/01/16 1818)  aspirin chewable tablet 162 mg (162 mg Oral Given 10/01/16 1818)     Initial Impression / Assessment and Plan / ED Course  I have reviewed the triage vital signs and the nursing notes.  Pertinent labs & imaging results that were available during my care of the patient were reviewed by me and considered in my medical decision making (see chart for details).  Clinical Course    Vitals:   10/01/16 1325 10/01/16 1800 10/01/16 1826 10/01/16 2003  BP: 136/93 140/90  146/92  Pulse: 88 90  91  Resp: 18 19  23   Temp:      TempSrc:      SpO2: 100% 100%  97%  Weight:   87.9 kg     Medications  furosemide (LASIX) injection 60 mg (60 mg Intravenous Given 10/01/16 1818)  aspirin chewable tablet 162 mg (162 mg Oral Given 10/01/16 1818)    Christine Mcdowell is 45 y.o. female presenting with Elevated blood pressure, intermittent fleeting chest pain which is atypical, EKG with no significant abnormality. She is reporting weight gain and increasing peripheral edema with dry cough and shortness of breath worsening orthopnea, she has in her Lasix regularly and states that she has been compliant with her low-salt diet, she is also taking torsemide however, thinks she is in a mild CHF exacerbation her chest x-ray shows cardiomegaly with  central venous congestion and a small effusion. Patient will be given 60 mg of Lasix IV.  After Lasix was given she reports improvement, will delta troponin.  Delta troponin negative.  I have advised her to follow closely with cardiology.  This is a shared visit with the attending physician who personally evaluated the patient and agrees with the care plan.   Evaluation does not show pathology that would require ongoing emergent intervention or inpatient treatment. Pt is hemodynamically stable and mentating appropriately. Discussed findings and plan with patient/guardian, who agrees with care plan. All questions answered. Return precautions discussed and outpatient follow up given.   Final Clinical Impressions(s) / ED Diagnoses   Final diagnoses:  Acute on chronic congestive heart failure, unspecified congestive heart failure type [facility])    New Prescriptions New Prescriptions   No medications on file     Monico Blitz, PA-C 10/01/16 2045    Daleen Bo, MD 10/04/16 1032

## 2016-10-01 NOTE — ED Notes (Signed)
Pt given snacks.

## 2016-10-01 NOTE — ED Notes (Signed)
Pt stable, ambulatory, states understanding of discharge instructions 

## 2016-10-03 NOTE — [place] Notes (Signed)
Cardiology Office Note    Date:  10/04/2016   ID:  Christine Mcdowell, DOB [birth year], MRN [id number]  PCP:  Maximino Greenland, MD  Cardiologist:  Dr. Johnsie Cancel  CC: post hospital follow up  History of Present Illness:  Christine Mcdowell is a 45 y.o. female with a history of advanced lupus with associated with myocarditis/cardiomyopathy (diagnosed in 01/2014, EF 35-40%), HTN, HLD, Type 2 DM, Fibromyalgia, and four self-reported CVA's who presents to clinic for post hospital follow up.  Cardiac MR in 02/2015 showed EF 46%. 2D ECHO 12/2015 showed mild LVH, EF 35-40%, diff HK, normal diastolic function, mild MR, trivial pericardial effusion.  Admitted 1/3-12/30/15 with acute on chronic combined systolic and diastolic CHF in the setting of community acquired pneumonia. She was diuresed with IV Lasix and treated with antibiotics.  Echocardiogram demonstrated EF 35-40%.  Serologic studies apparently did not suggest active lupus. She had been off of her medication secondary to finances. She was placed back on steroids.  She saw Dr. Johnsie Cancel in 12/2015 for palpitations. Event monitor in 01/2016 showed NSR with rare PVCs and sx did not correlated to arrhyhtmia.   She last saw Dr. Johnsie Cancel in 05/2016. She was doing well at that time.   She was admitted overnight 7/12-7/13/17 for atypical chest pain, dizziness, slurred speech and HTN. BP was noted to be severely elevated. Coreg was increased to 12.5mg  BID and valsartan increased to 160mg  BID (although she responded very well to irbesartan which is the hospital formulary). No evidence of renal artery stenosis or glomerulonephritis as secondary causes of HTN. Plan was to increase Coreg to 25mg  BID at a later time. Chronic NSAIDS not able to be stopped due to rebound pain.   She was seen in the ER on 10/01/16 for sharp atypical chest pain. Delta troponin negative. BNP mildly elevated to 364. CXR did show some pulmonary vascular congestion and she was given one dose IV  lasix 60mg  and discharged home. Continue on home lasix and PRN torsemide.   Today she presents to clinic for follow up. She feels like she "can't seem to keep the fluid off." She is short of breath and tired. She has some mild LE edema, orthopnea or PND. She wakes up dripping in sweat and wakes up with her heart pounding. She takes the torsemide almost every other day. Still has some random sharp pains in her chest. She also has been so dizzy but no syncope.    Past Medical History:  Diagnosis Date  . Anemia   . Anginal pain ([place])   . Asthma   . Cervical cancer ([place])   . CHF (congestive heart failure) ([place])   . Diabetes mellitus without complication ([place])    steroid induced  . Discoid lupus   . Fibromyalgia   . History of blood transfusion "several"   "related to anemia; had some w/hysterectomy also"  . Hx of cardiovascular stress test    ETT-Myoview (9/15):  No ischemia, EF 52%; NORMAL  . Hx of echocardiogram    Echo (9/15):  EF 50-55%, ant HK, Gr 1 DD, mild MR, mild LAE, no effusion  . Hypertension   . Iron deficiency anemia    h/o iron transfusions  . Lupus (systemic lupus erythematosus) ([place])   . Migraine    "a few/year" (07/03/2016)  . Pneumonia 12/2015  . RA (rheumatoid arthritis) ([place])    "all over" (07/03/2016)  . Sickle cell trait ([place])   . Stroke ([place]) 2014 X 1; 2015 X 2;  2016 X 1;    "right side of face more relaxed than the other; rare speech hesitation" (07/03/2016)    Past Surgical History:  Procedure Laterality Date  . ABDOMINAL HYSTERECTOMY  2009  . ABDOMINAL WOUND DEHISCENCE  2009  . DILATION AND CURETTAGE OF UTERUS  1991  . HEMATOMA EVACUATION  2009   abdomen  . INCISE AND DRAIN ABCESS  2009 X 2   "abdomen after hysterectomy"  . KNEE ARTHROSCOPY Right 1997  . TUBAL LIGATION  1996    Current Medications: Outpatient Medications Prior to Visit  Medication Sig Dispense Refill  . ACTEMRA 162 MG/0.9ML SOSY Inject 162 mg into the skin every 14 (fourteen)  days. Takes every other FRIDAY    . albuterol (ACCUNEB) 1.25 MG/3ML nebulizer solution Take 3 mLs (1.25 mg total) by nebulization every 6 (six) hours as needed for wheezing. 75 mL 12  . albuterol (PROVENTIL HFA;VENTOLIN HFA) 108 (90 Base) MCG/ACT inhaler Inhale 2 puffs into the lungs daily as needed for shortness of breath.    Marland Kitchen aspirin 81 MG chewable tablet Chew 162 mg by mouth every morning.     Marland Kitchen BREO ELLIPTA 100-25 MCG/INH AEPB Inhale 1 puff into the lungs daily.  2  . folic acid (FOLVITE) 1 MG tablet Take 1 tablet (1 mg total) by mouth every morning. 30 tablet 6  . hydroxychloroquine (PLAQUENIL) 200 MG tablet Take 1 tablet (200 mg total) by mouth every morning. (Patient taking differently: Take 200 mg by mouth 2 (two) times daily. ) 30 tablet 0  . meloxicam (MOBIC) 15 MG tablet Take 1 tablet (15 mg total) by mouth daily as needed. Inflammation or pain (Patient taking differently: Take 15 mg by mouth daily. Inflammation or pain) 30 tablet 0  . methotrexate (RHEUMATREX) 2.5 MG tablet Take 20 mg by mouth once a week. Take 8 tablets by mouth once a week on Friday  3  . Multiple Vitamin (MULTIVITAMIN WITH MINERALS) TABS Take 1 tablet by mouth every morning.     . ondansetron (ZOFRAN) 4 MG tablet Take 1 tablet (4 mg total) by mouth every 8 (eight) hours as needed for nausea or vomiting. 20 tablet 0  . predniSONE (DELTASONE) 20 MG tablet Take 1 tablet (20 mg total) by mouth daily with breakfast. 30 tablet 0  . torsemide (DEMADEX) 10 MG tablet Take 1 tablet (10 mg total) by mouth daily as needed (fluid). 30 tablet 6  . traMADol (ULTRAM) 50 MG tablet Take 50 mg by mouth every 6 (six) hours as needed for pain.  0  . traZODone (DESYREL) 50 MG tablet Take 50 mg by mouth at bedtime as needed for sleep.   5  . valsartan (DIOVAN) 160 MG tablet Take 160 mg by mouth 2 (two) times daily.   1  . carvedilol (COREG) 12.5 MG tablet Take 1 tablet (12.5 mg total) by mouth 2 (two) times daily with a meal. 60 tablet 0    . furosemide (LASIX) 40 MG tablet Take 1 tablet (40 mg total) by mouth 2 (two) times daily. 30 tablet 0  . potassium chloride SA (K-DUR,KLOR-CON) 20 MEQ tablet Take 2 tablets (40 mEq total) by mouth daily. 180 tablet 3   No facility-administered medications prior to visit.      Allergies:   Hydromorphone; Erythromycin; Iodinated diagnostic agents; Latex; and Other   Social History   Social History  . Marital status: Divorced    Spouse name: N/A  . Number of children: N/A  .  Years of education: N/A   Social History Main Topics  . Smoking status: Never Smoker  . Smokeless tobacco: Never Used  . Alcohol use No  . Drug use: No  . Sexual activity: Not Currently   Other Topics Concern  . None   Social History Narrative  . None     Family History:  The patient's family history includes Diabetes in her other; Heart attack in her father and paternal grandmother; Hypertension in her other.     ROS:   Please see the history of present illness.    ROS All other systems reviewed and are negative.   PHYSICAL EXAM:   VS:  BP (!) 142/86   Pulse 94   Ht 5\' 7"  (1.702 m)   Wt 192 lb 1.9 oz (87.1 kg)   BMI 30.09 kg/m    GEN: Well nourished, well developed, in no acute distress  HEENT: normal  Neck: no JVD, carotid bruits, or masses Cardiac: RRR; no murmurs, rubs, or gallops,no edema  Respiratory:  clear to auscultation bilaterally, normal work of breathing GI: soft, nontender, nondistended, + BS MS: no deformity or atrophy  Skin: warm and dry, no rash Neuro:  Alert and Oriented x 3, Strength and sensation are intact Psych: euthymic mood, full affect  Wt Readings from Last 3 Encounters:  10/04/16 192 lb 1.9 oz (87.1 kg)  10/01/16 193 lb 12.8 oz (87.9 kg)  07/04/16 182 lb 8 oz (82.8 kg)      Studies/Labs Reviewed:   EKG:  EKG is NOT ordered today.   Recent Labs: 11/18/2015: ALT 14 10/01/2016: B Natriuretic Peptide 364.6; BUN 9; Creatinine, Ser 0.91; Hemoglobin 12.7;  Magnesium 1.8; Platelets 219; Potassium 3.5; Sodium 137   Lipid Panel No results found for: CHOL, TRIG, HDL, CHOLHDL, VLDL, LDLCALC, LDLDIRECT  Additional studies/ records that were reviewed today include:  Echo 12/27/15 Mild LVH, EF 35-40%, diff HK, normal diastolic function, mild MR, trivial pericardial effusion  Event Monitor 08/17/15 NSR No arrhythmias No correlation with palpitations or chest pain   Cardiac MRI 03/02/15:  IMPRESSION: 1) Moderate LVE diffuse hypokinesis EF 46% Similar to MRI done 01/28/14 2) No delayed gadolinium uptake or evidence of myocarditis 3) Normal RA/RV/LA 4) Trivial posterior pericardial effusion  Echo (3/15):  Mild LVH, EF 35-40%, diff HK, mild MR.  Event Monitor : 01/2016 NSR Infrequent PVC;s Triggerred events palpitations, chest pain not associated with arrhythmias    ASSESSMENT & PLAN:   Atypical Chest Pain: she has been seen numerous occasions in the ER for this. Two negative myoviews. No further work up.   HTN: current medications: coreg 12.5mg  BID, valsartan 160mg  BID and lasix 40mg  daily. Recently seen in ER for elevated BP and BP mildly elevated today. Will increase Coreg to 25mg  BID. No evidence of renal artery stenosis or glomerulonephritis as secondary causes of HTN   Chronic Systolic CHF/ Lupus-associated Cardiomyopathy - last echocardiogram in 12/2015 and showed an EF of 35-40% with diffuse HK (previous EF was 30-35% in 01/2015). Her last ischemic evaluation was in 08/2014 and was a low-risk NST. - continue PO Lasix, PRN Torsemide, ARB and BB. Will increase Coreg to 25 mg BID and will increase lasix from 40mg  BID to 60mg  BID. Will increase Kdur from 60mEq daily to 60mg  MEq daily.   SLE: continue follow up with rheumatology   Medication Adjustments/Labs and Tests Ordered: Current medicines are reviewed at length with the patient today.  Concerns regarding medicines are outlined above.  Medication changes,  Labs and Tests  ordered today are listed in the Patient Instructions below. Patient Instructions  Medication Instructions:  Your physician has recommended you make the following change in your medication:  1.  INCREASE the Coreg to 25 mg taking 1 tablet twice a day 2.  INCREASE the Lasix to 40 mg taking 1 1/2 tablets twice a day 3.  INCREASE the Potassium to 20 meq taking 3 tablets daily   Labwork: TODAY:  BMET  Testing/Procedures: None ordered  Follow-Up: Your physician recommends that you schedule a follow-up appointment in: 10/23/16 ARRIVING AT 8:15 TO SEE KATIE THOMPSON, PA-C   Any Other Special Instructions Will Be Listed Below (If Applicable).     If you need a refill on your cardiac medications before your next appointment, please call your pharmacy.      Signed, Angelena Form, PA-C  10/04/2016 9:40 AM    [facility] [address], [place], [place]  [address] Phone: [phone]; Fax: [phone]

## 2016-10-04 ENCOUNTER — Ambulatory Visit (INDEPENDENT_AMBULATORY_CARE_PROVIDER_SITE_OTHER): Payer: BLUE CROSS/BLUE SHIELD | Admitting: Physician Assistant

## 2016-10-04 ENCOUNTER — Encounter: Payer: Self-pay | Admitting: *Deleted

## 2016-10-04 ENCOUNTER — Encounter: Payer: Self-pay | Admitting: Physician Assistant

## 2016-10-04 VITALS — BP 142/86 | HR 94 | Ht 67.0 in | Wt 192.1 lb

## 2016-10-04 DIAGNOSIS — I5042 Chronic combined systolic (congestive) and diastolic (congestive) heart failure: Secondary | ICD-10-CM | POA: Diagnosis not present

## 2016-10-04 DIAGNOSIS — I10 Essential (primary) hypertension: Secondary | ICD-10-CM | POA: Diagnosis not present

## 2016-10-04 DIAGNOSIS — I420 Dilated cardiomyopathy: Secondary | ICD-10-CM

## 2016-10-04 DIAGNOSIS — M329 Systemic lupus erythematosus, unspecified: Secondary | ICD-10-CM

## 2016-10-04 LAB — BASIC METABOLIC PANEL
BUN: 13 mg/dL (ref 7–25)
CO2: 26 mmol/L (ref 20–31)
Calcium: 8.6 mg/dL (ref 8.6–10.2)
Chloride: 106 mmol/L (ref 98–110)
Creat: 0.89 mg/dL (ref 0.50–1.10)
Glucose, Bld: 119 mg/dL — ABNORMAL HIGH (ref 65–99)
Potassium: 3.3 mmol/L — ABNORMAL LOW (ref 3.5–5.3)
Sodium: 141 mmol/L (ref 135–146)

## 2016-10-04 MED ORDER — POTASSIUM CHLORIDE CRYS ER 20 MEQ PO TBCR: 60.0000 meq | EXTENDED_RELEASE_TABLET | Freq: Every day | ORAL | 1 refills | Status: DC

## 2016-10-04 MED ORDER — FUROSEMIDE 40 MG PO TABS: 60.0000 mg | ORAL_TABLET | Freq: Two times a day (BID) | ORAL | 1 refills | Status: DC

## 2016-10-04 MED ORDER — CARVEDILOL 25 MG PO TABS: 25.0000 mg | ORAL_TABLET | Freq: Two times a day (BID) | ORAL | 1 refills | Status: DC

## 2016-10-04 NOTE — Patient Instructions (Addendum)
Medication Instructions:  Your physician has recommended you make the following change in your medication:  1.  INCREASE the Coreg to 25 mg taking 1 tablet twice a day 2.  INCREASE the Lasix to 40 mg taking 1 1/2 tablets twice a day 3.  INCREASE the Potassium to 20 meq taking 3 tablets daily   Labwork: TODAY:  BMET  Testing/Procedures: None ordered  Follow-Up: Your physician recommends that you schedule a follow-up appointment in: 10/23/16 ARRIVING AT 8:15 TO SEE KATIE THOMPSON, PA-C   Any Other Special Instructions Will Be Listed Below (If Applicable).     If you need a refill on your cardiac medications before your next appointment, please call your pharmacy.

## 2016-10-09 ENCOUNTER — Inpatient Hospital Stay (HOSPITAL_COMMUNITY)
Admission: EM | Admit: 2016-10-09 | Discharge: 2016-10-14 | DRG: 286 | Disposition: A | Discharge status: Home / Self Care | Payer: BLUE CROSS/BLUE SHIELD | Attending: Internal Medicine | Admitting: Internal Medicine

## 2016-10-09 ENCOUNTER — Telehealth: Payer: Self-pay | Admitting: Physician Assistant

## 2016-10-09 ENCOUNTER — Encounter (HOSPITAL_COMMUNITY): Payer: Self-pay

## 2016-10-09 ENCOUNTER — Emergency Department (HOSPITAL_COMMUNITY): Payer: BLUE CROSS/BLUE SHIELD

## 2016-10-09 DIAGNOSIS — R0602 Shortness of breath: Secondary | ICD-10-CM

## 2016-10-09 DIAGNOSIS — Z8541 Personal history of malignant neoplasm of cervix uteri: Secondary | ICD-10-CM

## 2016-10-09 DIAGNOSIS — I509 Heart failure, unspecified: Secondary | ICD-10-CM | POA: Diagnosis not present

## 2016-10-09 DIAGNOSIS — M069 Rheumatoid arthritis, unspecified: Secondary | ICD-10-CM

## 2016-10-09 DIAGNOSIS — D573 Sickle-cell trait: Secondary | ICD-10-CM | POA: Diagnosis not present

## 2016-10-09 DIAGNOSIS — Z79899 Other long term (current) drug therapy: Secondary | ICD-10-CM

## 2016-10-09 DIAGNOSIS — F410 Panic disorder [episodic paroxysmal anxiety] without agoraphobia: Secondary | ICD-10-CM | POA: Diagnosis present

## 2016-10-09 DIAGNOSIS — F419 Anxiety disorder, unspecified: Secondary | ICD-10-CM | POA: Diagnosis not present

## 2016-10-09 DIAGNOSIS — N179 Acute kidney failure, unspecified: Secondary | ICD-10-CM | POA: Diagnosis not present

## 2016-10-09 DIAGNOSIS — Z8249 Family history of ischemic heart disease and other diseases of the circulatory system: Secondary | ICD-10-CM

## 2016-10-09 DIAGNOSIS — R0600 Dyspnea, unspecified: Secondary | ICD-10-CM | POA: Diagnosis not present

## 2016-10-09 DIAGNOSIS — Z8673 Personal history of transient ischemic attack (TIA), and cerebral infarction without residual deficits: Secondary | ICD-10-CM | POA: Diagnosis not present

## 2016-10-09 DIAGNOSIS — I428 Other cardiomyopathies: Secondary | ICD-10-CM | POA: Diagnosis present

## 2016-10-09 DIAGNOSIS — J9601 Acute respiratory failure with hypoxia: Secondary | ICD-10-CM | POA: Diagnosis present

## 2016-10-09 DIAGNOSIS — J9621 Acute and chronic respiratory failure with hypoxia: Secondary | ICD-10-CM | POA: Diagnosis not present

## 2016-10-09 DIAGNOSIS — I119 Hypertensive heart disease without heart failure: Secondary | ICD-10-CM | POA: Diagnosis not present

## 2016-10-09 DIAGNOSIS — Z90710 Acquired absence of both cervix and uterus: Secondary | ICD-10-CM | POA: Diagnosis not present

## 2016-10-09 DIAGNOSIS — I10 Essential (primary) hypertension: Secondary | ICD-10-CM | POA: Diagnosis present

## 2016-10-09 DIAGNOSIS — R0603 Acute respiratory distress: Secondary | ICD-10-CM

## 2016-10-09 DIAGNOSIS — Z7982 Long term (current) use of aspirin: Secondary | ICD-10-CM

## 2016-10-09 DIAGNOSIS — E873 Alkalosis: Secondary | ICD-10-CM | POA: Diagnosis present

## 2016-10-09 DIAGNOSIS — R05 Cough: Secondary | ICD-10-CM | POA: Diagnosis not present

## 2016-10-09 DIAGNOSIS — M797 Fibromyalgia: Secondary | ICD-10-CM | POA: Diagnosis present

## 2016-10-09 DIAGNOSIS — M329 Systemic lupus erythematosus, unspecified: Secondary | ICD-10-CM | POA: Diagnosis not present

## 2016-10-09 DIAGNOSIS — G8929 Other chronic pain: Secondary | ICD-10-CM | POA: Diagnosis not present

## 2016-10-09 DIAGNOSIS — I110 Hypertensive heart disease with heart failure: Principal | ICD-10-CM | POA: Diagnosis present

## 2016-10-09 DIAGNOSIS — I5023 Acute on chronic systolic (congestive) heart failure: Secondary | ICD-10-CM | POA: Diagnosis not present

## 2016-10-09 DIAGNOSIS — J45909 Unspecified asthma, uncomplicated: Secondary | ICD-10-CM | POA: Diagnosis not present

## 2016-10-09 DIAGNOSIS — E785 Hyperlipidemia, unspecified: Secondary | ICD-10-CM | POA: Diagnosis present

## 2016-10-09 DIAGNOSIS — G894 Chronic pain syndrome: Secondary | ICD-10-CM | POA: Diagnosis not present

## 2016-10-09 LAB — BASIC METABOLIC PANEL
Anion gap: 8 (ref 5–15)
BUN: 13 mg/dL (ref 6–20)
CO2: 24 mmol/L (ref 22–32)
Calcium: 9 mg/dL (ref 8.9–10.3)
Chloride: 108 mmol/L (ref 101–111)
Creatinine, Ser: 0.95 mg/dL (ref 0.44–1.00)
GFR calc Af Amer: >60 mL/min (ref >60)
GFR calc non Af Amer: >60 mL/min (ref >60)
Glucose, Bld: 83 mg/dL (ref 65–99)
Potassium: 3.4 mmol/L — ABNORMAL LOW (ref 3.5–5.1)
Sodium: 140 mmol/L (ref 135–145)

## 2016-10-09 LAB — CBC
HCT: 36.9 % (ref 36.0–46.0)
Hemoglobin: 11.8 g/dL — ABNORMAL LOW (ref 12.0–15.0)
MCH: 27.8 pg (ref 26.0–34.0)
MCHC: 32 g/dL (ref 30.0–36.0)
MCV: 87 fL (ref 78.0–100.0)
Platelets: 220 10*3/uL (ref 150–400)
RBC: 4.24 MIL/uL (ref 3.87–5.11)
RDW: 13.8 % (ref 11.5–15.5)
WBC: 6.5 10*3/uL (ref 4.0–10.5)

## 2016-10-09 LAB — TROPONIN I
Troponin I: <0.03 ng/mL (ref <0.03)
Troponin I: <0.03 ng/mL (ref <0.03)

## 2016-10-09 LAB — I-STAT TROPONIN, ED: Troponin i, poc: 0.01 ng/mL (ref 0.00–0.08)

## 2016-10-09 LAB — BRAIN NATRIURETIC PEPTIDE: B Natriuretic Peptide: 442.3 pg/mL — ABNORMAL HIGH (ref 0.0–100.0)

## 2016-10-09 LAB — TSH: TSH: 0.929 u[IU]/mL (ref 0.350–4.500)

## 2016-10-09 MED ORDER — SODIUM CHLORIDE 0.9% FLUSH: 3.0000 mL | INTRAVENOUS | Status: DC | PRN

## 2016-10-09 MED ORDER — FLUTICASONE FUROATE-VILANTEROL 100-25 MCG/INH IN AEPB: 1.0000 | INHALATION_SPRAY | Freq: Every day | RESPIRATORY_TRACT | Status: DC

## 2016-10-09 MED ORDER — CARVEDILOL 25 MG PO TABS
25.0000 mg | ORAL_TABLET | Freq: Two times a day (BID) | ORAL | Status: DC
  Administered 2016-10-09 – 2016-10-11 (×5): 25 mg via ORAL
  Filled 2016-10-09 (×7): qty 1

## 2016-10-09 MED ORDER — ONDANSETRON HCL 4 MG PO TABS
4.0000 mg | ORAL_TABLET | Freq: Four times a day (QID) | ORAL | Status: DC | PRN
  Administered 2016-10-12: 4 mg via ORAL
  Filled 2016-10-09: qty 1

## 2016-10-09 MED ORDER — FUROSEMIDE 10 MG/ML IJ SOLN
60.0000 mg | Freq: Once | INTRAMUSCULAR | Status: AC
Start: 2016-10-09 — End: 2016-10-09
  Administered 2016-10-09: 60 mg via INTRAVENOUS
  Filled 2016-10-09: qty 6

## 2016-10-09 MED ORDER — FLUTICASONE FUROATE-VILANTEROL 100-25 MCG/INH IN AEPB
1.0000 | INHALATION_SPRAY | Freq: Every day | RESPIRATORY_TRACT | Status: DC
  Filled 2016-10-09: qty 28

## 2016-10-09 MED ORDER — HYDROXYCHLOROQUINE SULFATE 200 MG PO TABS
200.0000 mg | ORAL_TABLET | Freq: Two times a day (BID) | ORAL | Status: DC
  Administered 2016-10-09 – 2016-10-14 (×10): 200 mg via ORAL
  Filled 2016-10-09 (×10): qty 1

## 2016-10-09 MED ORDER — METHOTREXATE 2.5 MG PO TABS
20.0000 mg | ORAL_TABLET | ORAL | Status: DC
  Administered 2016-10-11: 20 mg via ORAL
  Filled 2016-10-09: qty 8

## 2016-10-09 MED ORDER — SODIUM CHLORIDE 0.9% FLUSH
3.0000 mL | Freq: Two times a day (BID) | INTRAVENOUS | Status: DC
  Administered 2016-10-10 – 2016-10-11 (×3): 3 mL via INTRAVENOUS

## 2016-10-09 MED ORDER — ALBUTEROL SULFATE (2.5 MG/3ML) 0.083% IN NEBU
3.0000 mL | INHALATION_SOLUTION | Freq: Four times a day (QID) | RESPIRATORY_TRACT | Status: DC | PRN
  Administered 2016-10-11 – 2016-10-12 (×2): 3 mL via RESPIRATORY_TRACT
  Filled 2016-10-09 (×2): qty 3

## 2016-10-09 MED ORDER — ASPIRIN 81 MG PO CHEW
162.0000 mg | CHEWABLE_TABLET | Freq: Every morning | ORAL | Status: DC
  Administered 2016-10-09 – 2016-10-13 (×5): 162 mg via ORAL
  Filled 2016-10-09 (×5): qty 2

## 2016-10-09 MED ORDER — ENOXAPARIN SODIUM 40 MG/0.4ML ~~LOC~~ SOLN
40.0000 mg | SUBCUTANEOUS | Status: DC
  Administered 2016-10-10: 40 mg via SUBCUTANEOUS
  Filled 2016-10-09 (×2): qty 0.4

## 2016-10-09 MED ORDER — FLUTICASONE FUROATE-VILANTEROL 100-25 MCG/INH IN AEPB
1.0000 | INHALATION_SPRAY | Freq: Every day | RESPIRATORY_TRACT | Status: DC
  Administered 2016-10-10 – 2016-10-14 (×5): 1 via RESPIRATORY_TRACT
  Filled 2016-10-09 (×2): qty 28

## 2016-10-09 MED ORDER — ONDANSETRON HCL 4 MG/2ML IJ SOLN
4.0000 mg | Freq: Four times a day (QID) | INTRAMUSCULAR | Status: DC | PRN
  Administered 2016-10-11 – 2016-10-12 (×2): 4 mg via INTRAVENOUS
  Filled 2016-10-09 (×2): qty 2

## 2016-10-09 MED ORDER — IRBESARTAN 150 MG PO TABS
150.0000 mg | ORAL_TABLET | Freq: Two times a day (BID) | ORAL | Status: DC
  Administered 2016-10-09 – 2016-10-10 (×2): 150 mg via ORAL
  Filled 2016-10-09 (×2): qty 1

## 2016-10-09 MED ORDER — HYDRALAZINE HCL 20 MG/ML IJ SOLN: 5.0000 mg | INTRAMUSCULAR | Status: DC | PRN

## 2016-10-09 MED ORDER — GUAIFENESIN ER 600 MG PO TB12
1200.0000 mg | ORAL_TABLET | Freq: Two times a day (BID) | ORAL | Status: DC | PRN
  Administered 2016-10-09: 1200 mg via ORAL
  Filled 2016-10-09: qty 2

## 2016-10-09 MED ORDER — ACETAMINOPHEN 325 MG PO TABS
650.0000 mg | ORAL_TABLET | Freq: Four times a day (QID) | ORAL | Status: DC | PRN
  Administered 2016-10-09: 650 mg via ORAL
  Filled 2016-10-09: qty 2

## 2016-10-09 MED ORDER — MELOXICAM 7.5 MG PO TABS
15.0000 mg | ORAL_TABLET | Freq: Every day | ORAL | Status: DC | PRN
  Administered 2016-10-09 – 2016-10-10 (×2): 15 mg via ORAL
  Filled 2016-10-09 (×4): qty 1

## 2016-10-09 MED ORDER — FUROSEMIDE 10 MG/ML IJ SOLN
60.0000 mg | Freq: Two times a day (BID) | INTRAMUSCULAR | Status: DC
  Administered 2016-10-09 – 2016-10-10 (×2): 60 mg via INTRAVENOUS
  Filled 2016-10-09 (×2): qty 6

## 2016-10-09 MED ORDER — SALINE SPRAY 0.65 % NA SOLN
1.0000 | NASAL | Status: DC | PRN
  Administered 2016-10-09: 1 via NASAL
  Filled 2016-10-09: qty 44

## 2016-10-09 MED ORDER — SODIUM CHLORIDE 0.9 % IV SOLN: 250.0000 mL | INTRAVENOUS | Status: DC | PRN

## 2016-10-09 MED ORDER — TRAMADOL HCL 50 MG PO TABS
50.0000 mg | ORAL_TABLET | Freq: Four times a day (QID) | ORAL | Status: DC | PRN
  Administered 2016-10-11 – 2016-10-13 (×4): 50 mg via ORAL
  Filled 2016-10-09 (×5): qty 1

## 2016-10-09 MED ORDER — ACETAMINOPHEN 650 MG RE SUPP: 650.0000 mg | Freq: Four times a day (QID) | RECTAL | Status: DC | PRN

## 2016-10-09 MED ORDER — PREDNISONE 20 MG PO TABS
20.0000 mg | ORAL_TABLET | Freq: Every day | ORAL | Status: DC
  Administered 2016-10-09 – 2016-10-14 (×6): 20 mg via ORAL
  Filled 2016-10-09 (×6): qty 1

## 2016-10-09 MED ORDER — POTASSIUM CHLORIDE CRYS ER 20 MEQ PO TBCR
60.0000 meq | EXTENDED_RELEASE_TABLET | Freq: Every day | ORAL | Status: DC
  Administered 2016-10-09 – 2016-10-11 (×3): 60 meq via ORAL
  Filled 2016-10-09 (×3): qty 3

## 2016-10-09 MED ORDER — ALBUTEROL SULFATE (2.5 MG/3ML) 0.083% IN NEBU
5.0000 mg | INHALATION_SOLUTION | Freq: Once | RESPIRATORY_TRACT | Status: AC
  Administered 2016-10-09: 5 mg via RESPIRATORY_TRACT
  Filled 2016-10-09: qty 6

## 2016-10-09 MED ORDER — PREDNISONE 20 MG PO TABS: 20.0000 mg | ORAL_TABLET | Freq: Every day | ORAL | Status: DC

## 2016-10-09 MED ORDER — ASPIRIN 81 MG PO CHEW: 162.0000 mg | CHEWABLE_TABLET | Freq: Every morning | ORAL | Status: DC

## 2016-10-09 MED ORDER — SODIUM CHLORIDE 0.9% FLUSH
3.0000 mL | Freq: Two times a day (BID) | INTRAVENOUS | Status: DC
  Administered 2016-10-09 – 2016-10-12 (×6): 3 mL via INTRAVENOUS

## 2016-10-09 MED ORDER — TOCILIZUMAB 162 MG/0.9ML ~~LOC~~ SOSY: 162.0000 mg | PREFILLED_SYRINGE | SUBCUTANEOUS | Status: DC

## 2016-10-09 MED ORDER — TRAZODONE HCL 50 MG PO TABS
50.0000 mg | ORAL_TABLET | Freq: Every evening | ORAL | Status: DC | PRN
  Administered 2016-10-11: 50 mg via ORAL
  Filled 2016-10-09 (×3): qty 1

## 2016-10-09 NOTE — H&P (Signed)
History and Physical    Bernadine Gathings [id number] DOB: 01-13-1971 DOA: 10/09/2016  PCP: Maximino Greenland, MD Patient coming from: home  Chief Complaint: SOB  HPI: Christine Mcdowell is a 45 y.o. female with medical history significant of cervical cancer, CHF, DM, discoid lupus, fibromyalgia, HTN, iron deficiency anemia, migraines, rheumatoid arthritis, sickle cell trait, CVA presenting with a primary complaint of shortness of breath and occasional wheezing. Shortness of breath started around 4 AM. Constant. Getting worse. Associated with lower extremity swelling bilaterally and increasing orthopnea over the last several days. Denies any fevers, chest pain, palpitations, abdominal pain, dysuria, frequency, polydipsia, rash, neck stiffness, headache. Endorses occasional cough with infrequent phlegm production over the last 2-3 weeks which patient felt was likely due to allergies. Patient did not take her Lasix on day of admission but states she is typically compliant with her medical regimen. Symptoms are worsened with ambulation and improved with rest while sitting up. Since her symptoms are similar to her previous CHF exacerbations. 6 pound weight gain. Dry weight 189.   ED Course: Objective findings outlined below. Lasix 60 mg IV given with good diuretic response. Patient never hypoxic but fairly dyspneic.  Review of Systems: As per HPI otherwise 10 point review of systems negative.   Ambulatory Status: No restrictions  Past Medical History:  Diagnosis Date  . Anemia   . Anginal pain ([place])   . Asthma   . Cervical cancer ([place])   . CHF (congestive heart failure) ([place])   . 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 Discoid lupus   . Fibromyalgia   . History of blood transfusion "several"   "related to anemia; had some w/hysterectomy also"  . Hx of cardiovascular stress test    ETT-Myoview (9/15):  No ischemia, EF 52%; NORMAL  . Hx of echocardiogram    Echo (9/15):  EF 50-55%, ant HK, Gr 1 DD, mild MR, mild LAE, no effusion  . Hypertension   . Iron deficiency anemia    h/o iron transfusions  . Lupus (systemic lupus erythematosus) ([place])   . Migraine    "a few/year" (07/03/2016)  . Pneumonia 12/2015  . RA (rheumatoid arthritis) ([place])    "all over" (07/03/2016)  . Sickle cell trait ([place])   . Stroke ([place]) 2014 X 1; 2015 X 2; 2016 X 1;    "right side of face more relaxed than the other; rare speech hesitation" (07/03/2016)    Past Surgical History:  Procedure Laterality Date  . ABDOMINAL HYSTERECTOMY  2009  . ABDOMINAL WOUND DEHISCENCE  2009  . DILATION AND CURETTAGE OF UTERUS  1991  . HEMATOMA EVACUATION  2009   abdomen  . INCISE AND DRAIN ABCESS  2009 X 2   "abdomen after hysterectomy"  . KNEE ARTHROSCOPY Right 1997  . TUBAL LIGATION  1996    Social History   Social History  . Marital status: Divorced    Spouse name: N/A  . Number of children: N/A  . Years of education: N/A   Occupational History  . Not on file.   Social History Main Topics  . Smoking status: Never Smoker  . Smokeless tobacco: Never Used  . Alcohol use No  . Drug use: No  . Sexual activity: Not Currently   Other Topics Concern  . Not on file   Social History Narrative  . No narrative on file    Allergies  Allergen Reactions  . Hydromorphone Nausea And Vomiting    Other  reaction(s): GI Upset (intolerance), Hypertension (intolerance) Raises blood pressure   . Erythromycin Nausea And Vomiting  . Iodinated Diagnostic Agents Other (See Comments)    Flu like symptoms  . Latex Hives  . Other Other (See Comments)    Skin Prep "makes my skin peel off" Paper tape causes skin burns    Family History  Problem Relation Age of Onset  . Heart attack Father   . Diabetes Other   . Hypertension Other   . Heart attack Paternal Grandmother     Prior to Admission medications   Medication Sig Start Date End Date Taking? Authorizing Provider  ACTEMRA  162 MG/0.9ML SOSY Inject 162 mg into the skin every 14 (fourteen) days. Takes every other FRIDAY 11/09/15  Yes Historical Provider, MD  albuterol (ACCUNEB) 1.25 MG/3ML nebulizer solution Take 3 mLs (1.25 mg total) by nebulization every 6 (six) hours as needed for wheezing. 03/04/16  Yes Lacretia Leigh, MD  albuterol (PROVENTIL HFA;VENTOLIN HFA) 108 (90 Base) MCG/ACT inhaler Inhale 2 puffs into the lungs daily as needed for shortness of breath.   Yes Historical Provider, MD  aspirin 81 MG chewable tablet Chew 162 mg by mouth every morning.    Yes Historical Provider, MD  BREO ELLIPTA 100-25 MCG/INH AEPB Inhale 1 puff into the lungs daily. 07/16/16  Yes Historical Provider, MD  carvedilol (COREG) 25 MG tablet Take 1 tablet (25 mg total) by mouth 2 (two) times daily with a meal. 10/04/16  Yes Eileen Stanford, PA-C  folic acid (FOLVITE) 1 MG tablet Take 1 tablet (1 mg total) by mouth every morning. 01/04/16  Yes Scott Joylene Draft, PA-C  furosemide (LASIX) 40 MG tablet Take 1.5 tablets (60 mg total) by mouth 2 (two) times daily. 10/04/16  Yes Eileen Stanford, PA-C  hydroxychloroquine (PLAQUENIL) 200 MG tablet Take 1 tablet (200 mg total) by mouth every morning. Patient taking differently: Take 200 mg by mouth 2 (two) times daily.  12/30/15  Yes Robbie Lis, MD  meloxicam (MOBIC) 15 MG tablet Take 1 tablet (15 mg total) by mouth daily as needed. Inflammation or pain Patient taking differently: Take 15 mg by mouth daily. Inflammation or pain 12/30/15  Yes Robbie Lis, MD  methotrexate (RHEUMATREX) 2.5 MG tablet Take 20 mg by mouth once a week. Take 8 tablets by mouth once a week on Friday 02/06/15  Yes Historical Provider, MD  Multiple Vitamin (MULTIVITAMIN WITH MINERALS) TABS Take 1 tablet by mouth every morning.    Yes Historical Provider, MD  ondansetron (ZOFRAN) 4 MG tablet Take 1 tablet (4 mg total) by mouth every 8 (eight) hours as needed for nausea or vomiting. 12/30/15  Yes Robbie Lis, MD  potassium  chloride SA (K-DUR,KLOR-CON) 20 MEQ tablet Take 3 tablets (60 mEq total) by mouth daily. 10/04/16  Yes Eileen Stanford, PA-C  predniSONE (DELTASONE) 20 MG tablet Take 1 tablet (20 mg total) by mouth daily with breakfast. 12/30/15  Yes Robbie Lis, MD  torsemide (DEMADEX) 10 MG tablet Take 1 tablet (10 mg total) by mouth daily as needed (fluid). 01/04/16  Yes Scott Joylene Draft, PA-C  traMADol (ULTRAM) 50 MG tablet Take 50 mg by mouth every 6 (six) hours as needed for pain. 09/26/16  Yes Historical Provider, MD  traZODone (DESYREL) 50 MG tablet Take 50 mg by mouth at bedtime as needed for sleep.  02/08/15  Yes Historical Provider, MD  valsartan (DIOVAN) 160 MG tablet Take 160 mg by mouth 2 (two)  times daily.  06/14/16  Yes Historical Provider, MD    Physical Exam: Vitals:   10/09/16 1245 10/09/16 1300 10/09/16 1330 10/09/16 1345  BP: 129/86 129/84 127/98 116/87  Pulse: 94 88 92 98  Resp: 20 22 23 26   Temp:      SpO2: 99% 100% 98% 100%  Weight:      Height:         General:  Appears calm and comfortable Eyes:  PERRL, EOMI, normal lids, iris ENT:  grossly normal hearing, lips & tongue, mmm Neck:  no LAD, masses or thyromegaly Cardiovascular:  RRR, 2/6 systolic murmur, 1+ right lower extremity edema, 2+ left lower extremity edema   Respiratory: Denies breast sounds in the bases with crackles bilaterally, no wheezing, patient visibly dyspneic especially with even mild exertion moving in bed. No hypoxemia appreciated Abdomen:  soft, ntnd, NABS Skin:  no rash or induration seen on limited exam Musculoskeletal:  grossly normal tone BUE/BLE, good ROM, no bony abnormality Psychiatric:  grossly normal mood and affect, speech fluent and appropriate, AOx3 Neurologic:  CN 2-12 grossly intact, moves all extremities in coordinated fashion, sensation intact  Labs on Admission: I have personally reviewed following labs and imaging studies  CBC:  Recent Labs Lab 10/09/16 0934  WBC 6.5  HGB 11.8*    HCT 36.9  MCV 87.0  PLT [id number]   Basic Metabolic Panel:  Recent Labs Lab 10/04/16 0936 10/09/16 0934  NA 141 140  K 3.3* 3.4*  CL 106 108  CO2 26 24  GLUCOSE 119* 83  BUN 13 13  CREATININE 0.89 0.95  CALCIUM 8.6 9.0   GFR: Estimated Creatinine Clearance: 83.8 mL/min (by C-G formula based on SCr of 0.95 mg/dL). Liver Function Tests: No results for input(s): AST, ALT, ALKPHOS, BILITOT, PROT, ALBUMIN in the last 168 hours. No results for input(s): LIPASE, AMYLASE in the last 168 hours. No results for input(s): AMMONIA in the last 168 hours. Coagulation Profile: No results for input(s): INR, PROTIME in the last 168 hours. Cardiac Enzymes: No results for input(s): CKTOTAL, CKMB, CKMBINDEX, TROPONINI in the last 168 hours. BNP (last 3 results) No results for input(s): PROBNP in the last 8760 hours. HbA1C: No results for input(s): HGBA1C in the last 72 hours. CBG: No results for input(s): GLUCAP in the last 168 hours. Lipid Profile: No results for input(s): CHOL, HDL, LDLCALC, TRIG, CHOLHDL, LDLDIRECT in the last 72 hours. Thyroid Function Tests: No results for input(s): TSH, T4TOTAL, FREET4, T3FREE, THYROIDAB in the last 72 hours. Anemia Panel: No results for input(s): VITAMINB12, FOLATE, FERRITIN, TIBC, IRON, RETICCTPCT in the last 72 hours. Urine analysis:    Component Value Date/Time   COLORURINE YELLOW 07/03/2016 1949   APPEARANCEUR CLEAR 07/03/2016 1949   LABSPEC 1.021 07/03/2016 1949   PHURINE 6.5 07/03/2016 1949   GLUCOSEU NEGATIVE 07/03/2016 1949   HGBUR NEGATIVE 07/03/2016 1949   BILIRUBINUR NEGATIVE 07/03/2016 1949   KETONESUR NEGATIVE 07/03/2016 1949   PROTEINUR NEGATIVE 07/03/2016 1949   UROBILINOGEN 1.0 05/29/2015 0536   NITRITE NEGATIVE 07/03/2016 1949   LEUKOCYTESUR NEGATIVE 07/03/2016 1949    Creatinine Clearance: Estimated Creatinine Clearance: 83.8 mL/min (by C-G formula based on SCr of 0.95 mg/dL).  Sepsis  Labs: @LABRCNTIP (procalcitonin:4,lacticidven:4) )No results found for this or any previous visit (from the past 240 hour(s)).   Radiological Exams on Admission: Dg Chest 2 View  Result Date: 10/09/2016 CLINICAL DATA:  Cough and shortness of breath. EXAM: CHEST  2 VIEW COMPARISON:  10/01/2016 FINDINGS: There  is increased cardiomegaly with new slight bilateral interstitial pulmonary edema. Pulmonary vascularity is normal. No discrete effusions. Bones are normal. IMPRESSION: Increased cardiomegaly.  Slight bilateral new pulmonary edema. Electronically Signed   By: Lorriane Shire M.D.   On: 10/09/2016 10:12    EKG: Independently reviewed.   Assessment/Plan Active Problems:   SLE (systemic lupus erythematosus) (HCC)   Essential hypertension   Acute on chronic systolic congestive heart failure (HCC)   Respiratory distress   Rheumatoid arthritis (HCC)   Asthma   Chronic pain    Acute on chronic systolic congestive heart failure: Last echo showing EF of 35%. Patient in obvious decompensated failure despite recent increase in Lasix to 60 mg twice a day with a dilution of when necessary torsemide 10 mg with little to no benefit. Patient's condition appears to be worsening she is likely secondary to her lupus and to a lesser degree her labile blood pressures and hypertension. Patient states her dry weight is 189 (per review of chart patient with 11kg weight gain in the last 9 months). - Telemetry observation - CHF team consult a greatly appreciate their input - Repeat echo - strict I's and O's, daily weights - Lasix 60 mg IV twice a day (diuresing well after initial 60 mg IV dose) - Continue beta blocker, potassium, ARB  SLE/RA: No evidence of acute flare - Continue methotrexate, prednisone, Plaquenil, prednisone, actemra,  Hypertension: - Continue Coreg, valsartan - Hydralazine when necessary  Asthma: No evidence of acute flare the patient has endorsed recent intermittent wheezing.  Patient may be developing some pulmonary fibrosis which is causing her worst dyspnea. - Outpatient PFT - albuterol prn - Recommending outpatient sleep study given patient's nightly snoring and marked elevated blood pressure readings in the morning upon waking. Suspect undiagnosed OSA - Continue Breo  Chronic MSK pain: - Into new Modic when necessary   Distant history of diabetes: Recent glucose readings all normal. Not on any medicines nor endorsing diet control. - A1c  DVT prophylaxis: Lovenox  Code Status: Full  Family Communication: Sister  Disposition Plan: Pending improvement in CHF and evaluation by CHF team.  Consults called: Heart failure team  Admission status: Telemetry, observation    MERRELL, DAVID J MD [facility]  If 7PM-7AM, please contact night-coverage [email or web address] Password [email or web address]  10/09/2016, 2:46 PM

## 2016-10-09 NOTE — ED Provider Notes (Signed)
[facility] DEPT Provider Note   CSN: [id number] Arrival date & time: 10/09/16  [id number]     History   Chief Complaint Chief Complaint  Patient presents with  . Shortness of Breath    HPI Christine Mcdowell is a 45 y.o. female.  Patient with history of congestive heart failure, on Lasix 40 mg twice a day -- presents with worsening shortness of breath, lower extremity swelling, increasing orthopnea over the past several days. Patient attempted to go to work today but was too short of breath and decided to come to the hospital. She has been taking her medication. Patient reports awaking several times during the night gasping for air feeling like her heart is racing. Symptoms similar to previous congestive heart failure exacerbations. No fevers or cough. No vomiting or diarrhea. No chest pains or abdominal pain. The onset of this condition was acute. The course is constant. Aggravating factors: activity. Alleviating factors: none.        Past Medical History:  Diagnosis Date  . Anemia   . Anginal pain ([place])   . Asthma   . Cervical cancer ([place])   . CHF (congestive heart failure) ([place])   . Diabetes mellitus without complication ([place])    steroid induced  . Discoid lupus   . Fibromyalgia   . History of blood transfusion "several"   "related to anemia; had some w/hysterectomy also"  . Hx of cardiovascular stress test    ETT-Myoview (9/15):  No ischemia, EF 52%; NORMAL  . Hx of echocardiogram    Echo (9/15):  EF 50-55%, ant HK, Gr 1 DD, mild MR, mild LAE, no effusion  . Hypertension   . Iron deficiency anemia    h/o iron transfusions  . Lupus (systemic lupus erythematosus) ([place])   . Migraine    "a few/year" (07/03/2016)  . Pneumonia 12/2015  . RA (rheumatoid arthritis) ([place])    "all over" (07/03/2016)  . Sickle cell trait ([place])   . Stroke ([place]) 2014 X 1; 2015 X 2; 2016 X 1;    "right side of face more relaxed than the other; rare speech hesitation" (07/03/2016)    Patient  Active Problem List   Diagnosis Date Noted  . Chest pain 07/03/2016  . Hypertensive crisis 07/03/2016  . Hypertensive urgency 07/03/2016  . Thrombocytopenia ([place]) 07/03/2016  . Lupus (systemic lupus erythematosus) ([place])   . Diabetes mellitus with complication ([place])   . Hypertensive emergency   . Anxiety state   . GERD (gastroesophageal reflux disease) 12/28/2015  . Acute on chronic systolic congestive heart failure ([place])   . Essential hypertension 12/26/2015  . Type 2 diabetes mellitus ([place]) 12/26/2015  . Acute on chronic systolic CHF (congestive heart failure), NYHA class 3 ([place]) 12/26/2015  . Chronic systolic heart failure ([place]) 08/18/2014  . Cardiomyopathy, dilated ([place]) 01/27/2014  . Hypokalemia 01/27/2014  . Shortness of breath 01/26/2014  . SLE (systemic lupus erythematosus) ([place]) 01/26/2014    Past Surgical History:  Procedure Laterality Date  . ABDOMINAL HYSTERECTOMY  2009  . ABDOMINAL WOUND DEHISCENCE  2009  . DILATION AND CURETTAGE OF UTERUS  1991  . HEMATOMA EVACUATION  2009   abdomen  . INCISE AND DRAIN ABCESS  2009 X 2   "abdomen after hysterectomy"  . KNEE ARTHROSCOPY Right 1997  . TUBAL LIGATION  1996    OB History    No data available       Home Medications    Prior to Admission medications   Medication Sig Start  Date End Date Taking? Authorizing Provider  ACTEMRA 162 MG/0.9ML SOSY Inject 162 mg into the skin every 14 (fourteen) days. Takes every other FRIDAY 11/09/15  Yes Historical Provider, MD  albuterol (ACCUNEB) 1.25 MG/3ML nebulizer solution Take 3 mLs (1.25 mg total) by nebulization every 6 (six) hours as needed for wheezing. 03/04/16  Yes Lacretia Leigh, MD  albuterol (PROVENTIL HFA;VENTOLIN HFA) 108 (90 Base) MCG/ACT inhaler Inhale 2 puffs into the lungs daily as needed for shortness of breath.   Yes Historical Provider, MD  aspirin 81 MG chewable tablet Chew 162 mg by mouth every morning.    Yes Historical Provider, MD  BREO ELLIPTA 100-25  MCG/INH AEPB Inhale 1 puff into the lungs daily. 07/16/16  Yes Historical Provider, MD  carvedilol (COREG) 25 MG tablet Take 1 tablet (25 mg total) by mouth 2 (two) times daily with a meal. 10/04/16  Yes Eileen Stanford, PA-C  folic acid (FOLVITE) 1 MG tablet Take 1 tablet (1 mg total) by mouth every morning. 01/04/16  Yes Scott Joylene Draft, PA-C  furosemide (LASIX) 40 MG tablet Take 1.5 tablets (60 mg total) by mouth 2 (two) times daily. 10/04/16  Yes Eileen Stanford, PA-C  hydroxychloroquine (PLAQUENIL) 200 MG tablet Take 1 tablet (200 mg total) by mouth every morning. Patient taking differently: Take 200 mg by mouth 2 (two) times daily.  12/30/15  Yes Robbie Lis, MD  meloxicam (MOBIC) 15 MG tablet Take 1 tablet (15 mg total) by mouth daily as needed. Inflammation or pain Patient taking differently: Take 15 mg by mouth daily. Inflammation or pain 12/30/15  Yes Robbie Lis, MD  methotrexate (RHEUMATREX) 2.5 MG tablet Take 20 mg by mouth once a week. Take 8 tablets by mouth once a week on Friday 02/06/15  Yes Historical Provider, MD  Multiple Vitamin (MULTIVITAMIN WITH MINERALS) TABS Take 1 tablet by mouth every morning.    Yes Historical Provider, MD  ondansetron (ZOFRAN) 4 MG tablet Take 1 tablet (4 mg total) by mouth every 8 (eight) hours as needed for nausea or vomiting. 12/30/15  Yes Robbie Lis, MD  potassium chloride SA (K-DUR,KLOR-CON) 20 MEQ tablet Take 3 tablets (60 mEq total) by mouth daily. 10/04/16  Yes Eileen Stanford, PA-C  predniSONE (DELTASONE) 20 MG tablet Take 1 tablet (20 mg total) by mouth daily with breakfast. 12/30/15  Yes Robbie Lis, MD  torsemide (DEMADEX) 10 MG tablet Take 1 tablet (10 mg total) by mouth daily as needed (fluid). 01/04/16  Yes Scott Joylene Draft, PA-C  traMADol (ULTRAM) 50 MG tablet Take 50 mg by mouth every 6 (six) hours as needed for pain. 09/26/16  Yes Historical Provider, MD  traZODone (DESYREL) 50 MG tablet Take 50 mg by mouth at bedtime as needed for  sleep.  02/08/15  Yes Historical Provider, MD  valsartan (DIOVAN) 160 MG tablet Take 160 mg by mouth 2 (two) times daily.  06/14/16  Yes Historical Provider, MD    Family History Family History  Problem Relation Age of Onset  . Heart attack Father   . Diabetes Other   . Hypertension Other   . Heart attack Paternal Grandmother     Social History Social History  Substance Use Topics  . Smoking status: Never Smoker  . Smokeless tobacco: Never Used  . Alcohol use No     Allergies   Hydromorphone; Erythromycin; Iodinated diagnostic agents; Latex; and Other   Review of Systems Review of Systems  Constitutional: Negative for fever.  HENT: Negative for rhinorrhea and sore throat.   Eyes: Negative for redness.  Respiratory: Positive for shortness of breath. Negative for cough.   Cardiovascular: Negative for chest pain.  Gastrointestinal: Negative for abdominal pain, diarrhea, nausea and vomiting.  Genitourinary: Negative for dysuria.  Musculoskeletal: Negative for myalgias.  Skin: Negative for rash.  Neurological: Negative for headaches.     Physical Exam Updated Vital Signs BP (!) 146/114   Pulse 95   Temp 98.1 F (36.7 C)   Resp (!) 29   Ht 5\' 7"  (1.702 m)   Wt 88.8 kg   SpO2 100%   BMI 30.67 kg/m   Physical Exam  Constitutional: She appears well-developed and well-nourished.  HENT:  Head: Normocephalic and atraumatic.  Eyes: Conjunctivae are normal. Right eye exhibits no discharge. Left eye exhibits no discharge.  Neck: Normal range of motion. Neck supple.  Cardiovascular: Normal rate, regular rhythm and normal heart sounds.   Pulmonary/Chest: Effort normal. She has no wheezes. She has no rhonchi. She has rales (mild, bases).  Abdominal: Soft. There is no tenderness.  Neurological: She is alert.  Skin: Skin is warm and dry.  Psychiatric: She has a normal mood and affect.  Nursing note and vitals reviewed.    ED Treatments / Results  Labs (all labs  ordered are listed, but only abnormal results are displayed) Labs Reviewed  BASIC METABOLIC PANEL - Abnormal; Notable for the following:       Result Value   Potassium 3.4 (*)    All other components within normal limits  CBC - Abnormal; Notable for the following:    Hemoglobin 11.8 (*)    All other components within normal limits  BRAIN NATRIURETIC PEPTIDE - Abnormal; Notable for the following:    B Natriuretic Peptide 442.3 (*)    All other components within normal limits  TSH  TROPONIN I  TROPONIN I  TROPONIN I  Randolm Idol, ED    Radiology Dg Chest 2 View  Result Date: 10/09/2016 CLINICAL DATA:  Cough and shortness of breath. EXAM: CHEST  2 VIEW COMPARISON:  10/01/2016 FINDINGS: There is increased cardiomegaly with new slight bilateral interstitial pulmonary edema. Pulmonary vascularity is normal. No discrete effusions. Bones are normal. IMPRESSION: Increased cardiomegaly.  Slight bilateral new pulmonary edema. Electronically Signed   By: Lorriane Shire M.D.   On: 10/09/2016 10:12    Procedures Procedures (including critical care time)  Medications Ordered in ED Medications  albuterol (PROVENTIL) (2.5 MG/3ML) 0.083% nebulizer solution 5 mg (5 mg Nebulization Given 10/09/16 1038)  furosemide (LASIX) injection 60 mg (60 mg Intravenous Given 10/09/16 1042)     Initial Impression / Assessment and Plan / ED Course  I have reviewed the triage vital signs and the nursing notes.  Pertinent labs & imaging results that were available during my care of the patient were reviewed by me and considered in my medical decision making (see chart for details).  Clinical Course   Patient seen and examined. Work-up initiated. Medications ordered.   Vital signs reviewed and are as follows: BP (!) 146/114   Pulse 95   Temp 98.1 F (36.7 C)   Resp (!) 29   Ht 5\' 7"  (1.702 m)   Wt 88.8 kg   SpO2 100%   BMI 30.67 kg/m   12:31 PM Patient with good UOP however remains tachypneic  and short of breath. Pt agreeable to admission for continued treatment. Discussed with Dr. Thomasene Lot.   Spoke with Dr. Marily Memos who will  see.   ED ECG REPORT   Date: 10/09/2016  Rate: 104  Rhythm: sinus tachycardia  QRS Axis: normal  Intervals: normal  ST/T Wave abnormalities: normal  Conduction Disutrbances:none  Narrative Interpretation:   Old EKG Reviewed: unchanged  I have personally reviewed the EKG tracing and agree with the computerized printout as noted.   Final Clinical Impressions(s) / ED Diagnoses   Final diagnoses:  Congestive heart failure, unspecified congestive heart failure chronicity, unspecified congestive heart failure type ([place])   Admit. Obs.   New Prescriptions Current Discharge Medication List       Carlisle Cater, PA-C 10/09/16 [place], MD 10/23/16 [phone]

## 2016-10-09 NOTE — ED Notes (Signed)
Pt ambulated to the bathroom.  

## 2016-10-09 NOTE — Progress Notes (Signed)
Pt. C/o headache and dizziness. PRN Tylenol given. VS obtained and stable. On call NP, Tylene Fantasia, for [facility] made aware via text page. RN will continue to monitor.

## 2016-10-09 NOTE — Progress Notes (Signed)
   Chart reviewed.   45 y.o. 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  Cardiac MR in 02/2015 showed EF 46%. 2D ECHO 12/2015 showed mild LVH, EF 35-40%, diff HK, normal diastolic function, mild MR, trivial pericardial effusion. Her last ischemic evaluation was in 08/2014 and was a low-risk NST.  Weight up almost 20 pounds since earlier this year.   Will increase lasix to 80 IV bid. Will repeat echo.   HF team will see in am.   Bensimhon, Daniel,MD 5:20 PM

## 2016-10-09 NOTE — ED Notes (Signed)
Went to xray 

## 2016-10-09 NOTE — Telephone Encounter (Signed)
New message   Pt c/o Shortness Of Breath: STAT if SOB developed within the last 24 hours or pt is noticeably SOB on the phone  1. Are you currently SOB (can you hear that pt is SOB on the phone)? yes  2. How long have you been experiencing SOB? 10/09/16  3. Are you SOB when sitting or when up moving around? both  4. Are you currently experiencing any other symptoms? Chest is tight

## 2016-10-09 NOTE — ED Triage Notes (Signed)
Patient here with dyspnea and shortness of breath x 2 hours, wheezing noted on arrival. States that she feels as if she cant get a good deep breath, no CP on assessment, has hx of CHF. Has not taken her lasix this am but takes her meds as prescribed

## 2016-10-09 NOTE — Telephone Encounter (Signed)
Received call transferred from operator and spoke with Christine Mcdowell. Connection is poor and it is difficult to hear Christine Mcdowell but she sounds short of breath. She reports shortness of breath started at 4 AM.  I instructed Christine Mcdowell to call EMS.  She reports she was on way to work and is currently driving. She will go to [facility]. She reports she is about 10 minutes from hospital.  I told her if symptoms worsen she should call EMS.  Trish notified.

## 2016-10-10 DIAGNOSIS — M069 Rheumatoid arthritis, unspecified: Secondary | ICD-10-CM

## 2016-10-10 DIAGNOSIS — M797 Fibromyalgia: Secondary | ICD-10-CM | POA: Diagnosis present

## 2016-10-10 DIAGNOSIS — R0602 Shortness of breath: Secondary | ICD-10-CM | POA: Diagnosis not present

## 2016-10-10 DIAGNOSIS — Z90710 Acquired absence of both cervix and uterus: Secondary | ICD-10-CM | POA: Diagnosis not present

## 2016-10-10 DIAGNOSIS — I110 Hypertensive heart disease with heart failure: Secondary | ICD-10-CM | POA: Diagnosis present

## 2016-10-10 DIAGNOSIS — R0600 Dyspnea, unspecified: Secondary | ICD-10-CM

## 2016-10-10 DIAGNOSIS — I10 Essential (primary) hypertension: Secondary | ICD-10-CM | POA: Diagnosis not present

## 2016-10-10 DIAGNOSIS — E785 Hyperlipidemia, unspecified: Secondary | ICD-10-CM | POA: Diagnosis present

## 2016-10-10 DIAGNOSIS — G8929 Other chronic pain: Secondary | ICD-10-CM | POA: Diagnosis present

## 2016-10-10 DIAGNOSIS — E873 Alkalosis: Secondary | ICD-10-CM | POA: Diagnosis present

## 2016-10-10 DIAGNOSIS — J9601 Acute respiratory failure with hypoxia: Secondary | ICD-10-CM | POA: Diagnosis present

## 2016-10-10 DIAGNOSIS — F419 Anxiety disorder, unspecified: Secondary | ICD-10-CM | POA: Diagnosis not present

## 2016-10-10 DIAGNOSIS — I428 Other cardiomyopathies: Secondary | ICD-10-CM | POA: Diagnosis present

## 2016-10-10 DIAGNOSIS — G894 Chronic pain syndrome: Secondary | ICD-10-CM | POA: Diagnosis not present

## 2016-10-10 DIAGNOSIS — Z8249 Family history of ischemic heart disease and other diseases of the circulatory system: Secondary | ICD-10-CM | POA: Diagnosis not present

## 2016-10-10 DIAGNOSIS — I5023 Acute on chronic systolic (congestive) heart failure: Secondary | ICD-10-CM | POA: Diagnosis not present

## 2016-10-10 DIAGNOSIS — J45909 Unspecified asthma, uncomplicated: Secondary | ICD-10-CM

## 2016-10-10 DIAGNOSIS — Z8673 Personal history of transient ischemic attack (TIA), and cerebral infarction without residual deficits: Secondary | ICD-10-CM | POA: Diagnosis not present

## 2016-10-10 DIAGNOSIS — M3213 Lung involvement in systemic lupus erythematosus: Secondary | ICD-10-CM

## 2016-10-10 DIAGNOSIS — Z79899 Other long term (current) drug therapy: Secondary | ICD-10-CM | POA: Diagnosis not present

## 2016-10-10 DIAGNOSIS — N179 Acute kidney failure, unspecified: Secondary | ICD-10-CM | POA: Diagnosis present

## 2016-10-10 DIAGNOSIS — J9621 Acute and chronic respiratory failure with hypoxia: Secondary | ICD-10-CM | POA: Diagnosis not present

## 2016-10-10 DIAGNOSIS — Z8541 Personal history of malignant neoplasm of cervix uteri: Secondary | ICD-10-CM | POA: Diagnosis not present

## 2016-10-10 DIAGNOSIS — I509 Heart failure, unspecified: Secondary | ICD-10-CM | POA: Diagnosis not present

## 2016-10-10 DIAGNOSIS — Z7982 Long term (current) use of aspirin: Secondary | ICD-10-CM | POA: Diagnosis not present

## 2016-10-10 DIAGNOSIS — D573 Sickle-cell trait: Secondary | ICD-10-CM | POA: Diagnosis present

## 2016-10-10 DIAGNOSIS — F410 Panic disorder [episodic paroxysmal anxiety] without agoraphobia: Secondary | ICD-10-CM | POA: Diagnosis present

## 2016-10-10 DIAGNOSIS — M329 Systemic lupus erythematosus, unspecified: Secondary | ICD-10-CM | POA: Diagnosis not present

## 2016-10-10 LAB — CBC
HCT: 39.5 % (ref 36.0–46.0)
Hemoglobin: 12.8 g/dL (ref 12.0–15.0)
MCH: 28.4 pg (ref 26.0–34.0)
MCHC: 32.4 g/dL (ref 30.0–36.0)
MCV: 87.8 fL (ref 78.0–100.0)
Platelets: 221 10*3/uL (ref 150–400)
RBC: 4.5 MIL/uL (ref 3.87–5.11)
RDW: 13.8 % (ref 11.5–15.5)
WBC: 8.5 10*3/uL (ref 4.0–10.5)

## 2016-10-10 LAB — BASIC METABOLIC PANEL
Anion gap: 9 (ref 5–15)
BUN: 15 mg/dL (ref 6–20)
CO2: 26 mmol/L (ref 22–32)
Calcium: 9 mg/dL (ref 8.9–10.3)
Chloride: 104 mmol/L (ref 101–111)
Creatinine, Ser: 1.14 mg/dL — ABNORMAL HIGH (ref 0.44–1.00)
GFR calc Af Amer: >60 mL/min (ref >60)
GFR calc non Af Amer: 56 mL/min — ABNORMAL LOW (ref >60)
Glucose, Bld: 187 mg/dL — ABNORMAL HIGH (ref 65–99)
Potassium: 4 mmol/L (ref 3.5–5.1)
Sodium: 139 mmol/L (ref 135–145)

## 2016-10-10 LAB — GLUCOSE, CAPILLARY
Glucose-Capillary: 167 mg/dL — ABNORMAL HIGH (ref 65–99)
Glucose-Capillary: 116 mg/dL — ABNORMAL HIGH (ref 65–99)

## 2016-10-10 LAB — TROPONIN I: Troponin I: <0.03 ng/mL (ref <0.03)

## 2016-10-10 MED ORDER — SACUBITRIL-VALSARTAN 49-51 MG PO TABS
1.0000 | ORAL_TABLET | Freq: Two times a day (BID) | ORAL | Status: DC
  Administered 2016-10-10 – 2016-10-11 (×3): 1 via ORAL
  Filled 2016-10-10 (×4): qty 1

## 2016-10-10 MED ORDER — INSULIN ASPART 100 UNIT/ML ~~LOC~~ SOLN
0.0000 [IU] | Freq: Three times a day (TID) | SUBCUTANEOUS | Status: DC
Start: 2016-10-10 — End: 2016-10-12
  Administered 2016-10-10: 2 [IU] via SUBCUTANEOUS

## 2016-10-10 MED ORDER — FUROSEMIDE 10 MG/ML IJ SOLN
80.0000 mg | Freq: Two times a day (BID) | INTRAMUSCULAR | Status: DC
  Administered 2016-10-10: 80 mg via INTRAVENOUS
  Filled 2016-10-10 (×2): qty 8

## 2016-10-10 MED ORDER — FOLIC ACID 1 MG PO TABS
1.0000 mg | ORAL_TABLET | Freq: Every morning | ORAL | Status: DC
  Administered 2016-10-10 – 2016-10-14 (×5): 1 mg via ORAL
  Filled 2016-10-10 (×5): qty 1

## 2016-10-10 MED ORDER — INSULIN ASPART 100 UNIT/ML ~~LOC~~ SOLN: 0.0000 [IU] | Freq: Every day | SUBCUTANEOUS | Status: DC

## 2016-10-10 MED ORDER — SPIRONOLACTONE 25 MG PO TABS
12.5000 mg | ORAL_TABLET | Freq: Every day | ORAL | Status: DC
  Administered 2016-10-10 – 2016-10-11 (×2): 12.5 mg via ORAL
  Filled 2016-10-10 (×2): qty 1

## 2016-10-10 MED ORDER — TOCILIZUMAB 162 MG/0.9ML ~~LOC~~ SOSY: 162.0000 mg | PREFILLED_SYRINGE | SUBCUTANEOUS | Status: DC

## 2016-10-10 NOTE — Progress Notes (Signed)
Pt refused bed alarm on, educated on pt's safety plan. Will continue to do hourly rounding.

## 2016-10-10 NOTE — Progress Notes (Signed)
PROGRESS NOTE    Christine Mcdowell  [id number] DOB: [birth year] DOA: 10/09/2016 PCP: Maximino Greenland, MD   Chief Complaint  Patient presents with  . Shortness of Breath    Brief Narrative:  HPI on 10/09/2016 by Dr. Linna Darner Christine Mcdowell is a 45 y.o. female with medical history significant of cervical cancer, CHF, DM, discoid lupus, fibromyalgia, HTN, iron deficiency anemia, migraines, rheumatoid arthritis, sickle cell trait, CVA presenting with a primary complaint of shortness of breath and occasional wheezing. Shortness of breath started around 4 AM. Constant. Getting worse. Associated with lower extremity swelling bilaterally and increasing orthopnea over the last several days. Denies any fevers, chest pain, palpitations, abdominal pain, dysuria, frequency, polydipsia, rash, neck stiffness, headache. Endorses occasional cough with infrequent phlegm production over the last 2-3 weeks which patient felt was likely due to allergies. Patient did not take her Lasix on day of admission but states she is typically compliant with her medical regimen. Symptoms are worsened with ambulation and improved with rest while sitting up. Since her symptoms are similar to her previous CHF exacerbations. 6 pound weight gain. Dry weight 189.  Assessment & Plan   Acute on chronic systolic congestive heart failure -Last echo 12/2015 showed an EF 35-40% -patient states she was recently admitted for the same  -patient has noted weight gain as well as generalized swelling -BNP 442.3 -CXR: increased cardiomegaly, pulmonary edema -Cardiology consulted and appreciated -Continue IV lasix -monitor intake/output, daily weights -States her normal weight is 189lbs -Repeat echocardiogram pending -Continue coreg -started on Ernesto and spironolactone  Lupus/Rheumatoid arthritis -No evidence of acute flare -Continue methotrexate, prednisone, Plaquenil, prednisone, actemra  Hypertension -Continue  Coreg -Continue diuretics, started on ernesto  Asthma -No evidence of acute flare the patient has endorsed recent intermittent wheezing. Patient may be developing some pulmonary fibrosis which is causing her worst dyspnea. -Will need outpatient PFT -Continue albuterol prn and Breo -May need outpatient sleep eval  Chronic musculoskeletal pain/fibromyalgia  -Continue mobic, tramadol PRN  Distant history of diabetes -Recent glucose readings all normal. Not on any medicines nor endorsing diet control. -A1c pending -Will place on ISS with CBG monitoring   DVT Prophylaxis  Lovenox  Code Status: Full  Family Communication: none at bedside  Disposition Plan: Admitted  Consultants Cardiology  Procedures  None  Antibiotics   Anti-infectives    Start     Dose/Rate Route Frequency Ordered Stop   10/09/16 2200  hydroxychloroquine (PLAQUENIL) tablet 200 mg     200 mg Oral 2 times daily 10/09/16 1441        Subjective:   [name] seen and examined today.  Feels breathing has improved since admission, but not back to her baseline.  Continues to feel "swollen", but better.   Denies chest pain, abdominal pain, N/V/D/C, dizziness, headache.  Objective:   Vitals:   10/10/16 0000 10/10/16 0445 10/10/16 0800 10/10/16 1100  BP: 117/78 128/90 136/89 114/77  Pulse: 81 68 82 84  Resp: 18 18 19 18   Temp: 97.4 F (36.3 C) 98.2 F (36.8 C) 97.9 F (36.6 C) 98.4 F (36.9 C)  TempSrc: Oral Oral Oral Oral  SpO2: 97% 95% 97% 98%  Weight:  86.7 kg (191 lb 1.6 oz)    Height:        Intake/Output Summary (Last 24 hours) at 10/10/16 1411 Last data filed at 10/10/16 1312  Gross per 24 hour  Intake             1015 ml  Output             1950 ml  Net             -935 ml   Filed Weights   10/09/16 0951 10/09/16 1600 10/10/16 0445  Weight: 88.8 kg (195 lb 12.8 oz) 87.5 kg (192 lb 14.4 oz) 86.7 kg (191 lb 1.6 oz)    Exam  General: Well developed, well nourished, NAD,  appears stated age  HEENT: NCAT, mucous membranes moist.   Neck: Supple, + JVD, no masses  Cardiovascular: S1 S2 auscultated, no rubs, murmurs or gallops. Regular rate and rhythm.  Respiratory: Clear to auscultation bilaterally with equal chest rise  Abdomen: Soft, nontender, nondistended, + bowel sounds  Extremities: warm dry without cyanosis clubbing. +LE edema  Neuro: AAOx3, nonfocal  Skin: Without rashes exudates or nodules  Psych: Normal affect and demeanor with intact judgement and insight   Data Reviewed: I have personally reviewed following labs and imaging studies  CBC:  Recent Labs Lab 10/09/16 0934 10/10/16 0221  WBC 6.5 8.5  HGB 11.8* 12.8  HCT 36.9 39.5  MCV 87.0 87.8  PLT 220 [id number]   Basic Metabolic Panel:  Recent Labs Lab 10/04/16 0936 10/09/16 0934 10/10/16 0221  NA 141 140 139  K 3.3* 3.4* 4.0  CL 106 108 104  CO2 26 24 26   GLUCOSE 119* 83 187*  BUN 13 13 15   CREATININE 0.89 0.95 1.14*  CALCIUM 8.6 9.0 9.0   GFR: Estimated Creatinine Clearance: 69 mL/min (by C-G formula based on SCr of 1.14 mg/dL (H)). Liver Function Tests: No results for input(s): AST, ALT, ALKPHOS, BILITOT, PROT, ALBUMIN in the last 168 hours. No results for input(s): LIPASE, AMYLASE in the last 168 hours. No results for input(s): AMMONIA in the last 168 hours. Coagulation Profile: No results for input(s): INR, PROTIME in the last 168 hours. Cardiac Enzymes:  Recent Labs Lab 10/09/16 1501 10/09/16 2039 10/10/16 0221  TROPONINI <0.03 <0.03 <0.03   BNP (last 3 results) No results for input(s): PROBNP in the last 8760 hours. HbA1C: No results for input(s): HGBA1C in the last 72 hours. CBG: No results for input(s): GLUCAP in the last 168 hours. Lipid Profile: No results for input(s): CHOL, HDL, LDLCALC, TRIG, CHOLHDL, LDLDIRECT in the last 72 hours. Thyroid Function Tests:  Recent Labs  10/09/16 1501  TSH 0.929   Anemia Panel: No results for input(s):  VITAMINB12, FOLATE, FERRITIN, TIBC, IRON, RETICCTPCT in the last 72 hours. Urine analysis:    Component Value Date/Time   COLORURINE YELLOW 07/03/2016 1949   APPEARANCEUR CLEAR 07/03/2016 1949   LABSPEC 1.021 07/03/2016 1949   PHURINE 6.5 07/03/2016 1949   GLUCOSEU NEGATIVE 07/03/2016 1949   HGBUR NEGATIVE 07/03/2016 1949   BILIRUBINUR NEGATIVE 07/03/2016 1949   KETONESUR NEGATIVE 07/03/2016 1949   PROTEINUR NEGATIVE 07/03/2016 1949   UROBILINOGEN 1.0 05/29/2015 0536   NITRITE NEGATIVE 07/03/2016 1949   LEUKOCYTESUR NEGATIVE 07/03/2016 1949   Sepsis Labs: @LABRCNTIP (procalcitonin:4,lacticidven:4)  )No results found for this or any previous visit (from the past 240 hour(s)).    Radiology Studies: Dg Chest 2 View  Result Date: 10/09/2016 CLINICAL DATA:  Cough and shortness of breath. EXAM: CHEST  2 VIEW COMPARISON:  10/01/2016 FINDINGS: There is increased cardiomegaly with new slight bilateral interstitial pulmonary edema. Pulmonary vascularity is normal. No discrete effusions. Bones are normal. IMPRESSION: Increased cardiomegaly.  Slight bilateral new pulmonary edema. Electronically Signed   By: Lorriane Shire M.D.   On: 10/09/2016  10:12     Scheduled Meds: . aspirin  162 mg Oral q morning - 10a  . carvedilol  25 mg Oral BID WC  . enoxaparin (LOVENOX) injection  40 mg Subcutaneous Q24H  . fluticasone furoate-vilanterol  1 puff Inhalation Daily  . folic acid  1 mg Oral q morning - 10a  . furosemide  80 mg Intravenous BID  . hydroxychloroquine  200 mg Oral BID  . insulin aspart  0-5 Units Subcutaneous QHS  . insulin aspart  0-9 Units Subcutaneous TID WC  . [START ON 10/11/2016] methotrexate  20 mg Oral Q Fri  . potassium chloride SA  60 mEq Oral Daily  . predniSONE  20 mg Oral Q breakfast  . sacubitril-valsartan  1 tablet Oral BID  . sodium chloride flush  3 mL Intravenous Q12H  . sodium chloride flush  3 mL Intravenous Q12H  . spironolactone  12.5 mg Oral Daily    Continuous Infusions:    LOS: 0 days   Time Spent in minutes   30 minutes  MIKHAIL, MARYANN D.O. on 10/10/2016 at 2:11 PM  Between 7am to 7pm - Pager - [phone]  After 7pm go to [email or web address] - password [email or web address]  And look for the night coverage person covering for me after hours  [facility] Office  [phone]

## 2016-10-10 NOTE — Consult Note (Signed)
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   Cardiac MR in 02/2015 showed EF 46%. No LGE.  2D ECHO 12/2015 showed mild LVH, EF 35-40%, diff HK, normal diastolic function, mild MR, trivial pericardial effusion. Her last ischemic evaluation was in 08/2014 and was a low-risk NST.  Over the last 6 months she has struggled with dyspnea and weight gain. Diuretics adjusted by Dr Johnsie Cancel. She has been taking 60 mg po lasix twice a day + 20 mg of torsemide as needed. Usually takes torsemide 4 times a week. Baseline weight 189 pounds. She is on 20 mg prednisone daily with no recent up titration. Follows low salt diet and limits fluid intake.   Last week she was seen in the ED with increased dyspnea. Says her weight went up from 189 to 200 pounds. Given IV lasix and discharged home.   Yesterday she presented to  [facility] ED with increased dyspnea. Weight had gone up at home . SOB with exertion, + PND, +Orhtopnea.  Admitted with recurrent HF exacerbation. Diuresing with IV lasix. CXR with pulmonary edema. Pertinent admission labs include: K 3.4, creatinine 0.95, Hgb 11.8, WBC 6.5 , BNP 442, CE 0.03  Today she has ongoing orthopnea and mild dyspnea with exertion.   Review of Systems: [y] = yes, [ ]  = no   General: Weight gain [Y ]; Weight loss [ ] ; Anorexia [ ] ; Fatigue [Y ]; Fever [ ] ; Chills [ ] ; Weakness [Y ]  Cardiac: Chest pain/pressure [ ] ; Resting SOB [ ] ; Exertional SOB [ Y]; Orthopnea [Y ]; Pedal Edema [Y ]; Palpitations [ ] ; Syncope [ ] ; Presyncope [ ] ; Paroxysmal nocturnal dyspnea[ ]   Pulmonary: Cough [ ] ;  Wheezing[ ] ; Hemoptysis[ ] ; Sputum [ ] ; Snoring [ ]   GI: Vomiting[ ] ; Dysphagia[ ] ; Melena[ ] ; Hematochezia [ ] ; Heartburn[ ] ; Abdominal pain [ ] ; Constipation [ ] ; Diarrhea [ ] ; BRBPR [ ]   GU: Hematuria[ ] ; Dysuria [ ] ; Nocturia[ ]   Vascular: Pain in legs with walking [ ] ; Pain in feet with lying flat [ ] ; Non-healing sores [ ] ; Stroke [ ] ; TIA [ ] ; Slurred speech [ ] ;  Neuro: Headaches[ ] ; Vertigo[ ] ; Seizures[ ] ; Paresthesias[ ] ;Blurred vision [ ] ; Diplopia [ ] ; Vision changes [ ]   Ortho/Skin: Arthritis [ ] ; Joint pain [ Y]; Muscle pain [ ] ; Joint swelling [ ] ; Back Pain [ Y]; Rash [ ]   Psych: Depression[ ] ; Anxiety[ ]   Heme: Bleeding problems [ ] ; Clotting disorders [ ] ; Anemia [ ]   Endocrine: Diabetes [ ] ; Thyroid dysfunction[ ]   Home Medications Prior to Admission medications   Medication Sig Start Date End Date Taking? Authorizing Provider  ACTEMRA 162 MG/0.9ML SOSY Inject 162 mg into the skin every 14 (fourteen) days. Takes every other FRIDAY 11/09/15  Yes Historical Provider, MD  albuterol (ACCUNEB) 1.25 MG/3ML nebulizer solution Take 3 mLs (1.25 mg total) by nebulization every 6 (six) hours as needed for wheezing. 03/04/16  Yes Lacretia Leigh, MD  albuterol (PROVENTIL HFA;VENTOLIN HFA) 108 (90 Base) MCG/ACT inhaler Inhale 2 puffs into the lungs daily as needed for shortness of breath.   Yes  Historical Provider, MD  aspirin 81 MG chewable tablet Chew 162 mg by mouth every morning.    Yes Historical Provider, MD  BREO ELLIPTA 100-25 MCG/INH AEPB Inhale 1 puff into the lungs daily. 07/16/16  Yes Historical Provider, MD  carvedilol (COREG) 25 MG tablet Take 1 tablet (25 mg total) by mouth 2 (two) times daily with a meal. 10/04/16  Yes Eileen Stanford, PA-C  folic acid (FOLVITE) 1 MG tablet Take 1 tablet (1 mg total) by mouth every morning. 01/04/16  Yes Scott Joylene Draft, PA-C  furosemide (LASIX) 40 MG tablet Take 1.5 tablets (60 mg total) by mouth 2 (two) times daily. 10/04/16  Yes Eileen Stanford, PA-C  hydroxychloroquine (PLAQUENIL) 200 MG tablet Take 1 tablet (200 mg total) by mouth every morning. Patient taking differently: Take 200 mg by mouth 2 (two) times daily.  12/30/15  Yes Robbie Lis, MD  meloxicam (MOBIC) 15 MG tablet Take 1 tablet (15 mg total) by mouth daily as needed. Inflammation or pain Patient taking differently: Take 15 mg by mouth daily. Inflammation or pain 12/30/15  Yes Robbie Lis, MD  methotrexate (RHEUMATREX) 2.5 MG tablet Take 20 mg by mouth once a week. Take 8 tablets by mouth once a week on Friday 02/06/15  Yes Historical Provider, MD  Multiple Vitamin (MULTIVITAMIN WITH MINERALS) TABS Take 1 tablet by mouth every morning.    Yes Historical Provider, MD  ondansetron (ZOFRAN) 4 MG tablet Take 1 tablet (4 mg total) by mouth every 8 (eight) hours as needed for nausea or vomiting. 12/30/15  Yes Robbie Lis, MD  potassium chloride SA (K-DUR,KLOR-CON) 20 MEQ tablet Take 3 tablets (60 mEq total) by mouth daily. 10/04/16  Yes Eileen Stanford, PA-C  predniSONE (DELTASONE) 20 MG tablet Take 1 tablet (20 mg total) by mouth daily with breakfast. 12/30/15  Yes Robbie Lis, MD  torsemide (DEMADEX) 10 MG tablet Take 1 tablet (10 mg total) by mouth daily as needed (fluid). 01/04/16  Yes Scott Joylene Draft, PA-C  traMADol (ULTRAM) 50 MG tablet Take 50 mg by mouth every 6 (six) hours as needed for pain. 09/26/16  Yes Historical Provider, MD  traZODone (DESYREL) 50 MG tablet Take 50 mg by mouth at bedtime as needed for sleep.  02/08/15  Yes Historical Provider, MD  valsartan (DIOVAN) 160 MG tablet Take 160 mg by mouth 2 (two) times daily.  06/14/16  Yes Historical Provider, MD    Past Medical History: Past Medical History:  Diagnosis Date  . Anemia   . Anginal pain ([place])   . Asthma   . Cervical cancer ([place])   . CHF (congestive heart failure) ([place])   . Diabetes mellitus without complication ([place])    steroid induced  . Discoid lupus   . Fibromyalgia   . History of blood  transfusion "several"   "related to anemia; had some w/hysterectomy also"  . Hx of cardiovascular stress test    ETT-Myoview (9/15):  No ischemia, EF 52%; NORMAL  . Hx of echocardiogram    Echo (9/15):  EF 50-55%, ant HK, Gr 1 DD, mild MR, mild LAE, no effusion  . Hypertension   . Iron deficiency anemia    h/o iron transfusions  . Lupus (systemic lupus erythematosus) ([place])   . Migraine    "a few/year" (07/03/2016)  . Pneumonia 12/2015  . RA (rheumatoid arthritis) ([place])    "all over" (07/03/2016)  . Sickle cell trait ([place])   . Stroke [facility])  2014 X 1; 2015 X 2; 2016 X 1;    "right side of face more relaxed than the other; rare speech hesitation" (07/03/2016)    Past Surgical History: Past Surgical History:  Procedure Laterality Date  . ABDOMINAL HYSTERECTOMY  2009  . ABDOMINAL WOUND DEHISCENCE  2009  . DILATION AND CURETTAGE OF UTERUS  1991  . HEMATOMA EVACUATION  2009   abdomen  . INCISE AND DRAIN ABCESS  2009 X 2   "abdomen after hysterectomy"  . KNEE ARTHROSCOPY Right 1997  . TUBAL LIGATION  1996    Family History: Family History  Problem Relation Age of Onset  . Heart attack Father   . Diabetes Other   . Hypertension Other   . Heart attack Paternal Grandmother     Social History: Social History   Social History  . Marital status: Divorced    Spouse name: N/A  . Number of children: N/A  . Years of education: N/A   Social History Main Topics  . Smoking status: Never Smoker  . Smokeless tobacco: Never Used  . Alcohol use No  . Drug use: No  . Sexual activity: Not Currently   Other Topics Concern  . None   Social History Narrative  . None    Allergies:  Allergies  Allergen Reactions  . Hydromorphone Nausea And Vomiting    Other reaction(s): GI Upset (intolerance), Hypertension (intolerance) Raises blood pressure   . Erythromycin Nausea And Vomiting  . Iodinated Diagnostic Agents Other (See Comments)    Flu like symptoms  . Latex Hives  . Other  Other (See Comments)    Skin Prep "makes my skin peel off" Paper tape causes skin burns    Objective:    Vital Signs:   Temp:  [97.4 F (36.3 C)-98.7 F (37.1 C)] 97.9 F (36.6 C) (10/19 0800) Pulse Rate:  [68-103] 82 (10/19 0800) Resp:  [15-29] 19 (10/19 0800) BP: (116-146)/(71-114) 136/89 (10/19 0800) SpO2:  [93 %-100 %] 97 % (10/19 0800) Weight:  [191 lb 1.6 oz (86.7 kg)-192 lb 14.4 oz (87.5 kg)] 191 lb 1.6 oz (86.7 kg) (10/19 0445) Last BM Date: 10/10/16  Weight change: Filed Weights   10/09/16 0951 10/09/16 1600 10/10/16 0445  Weight: 195 lb 12.8 oz (88.8 kg) 192 lb 14.4 oz (87.5 kg) 191 lb 1.6 oz (86.7 kg)    Intake/Output:   Intake/Output Summary (Last 24 hours) at 10/10/16 1019 Last data filed at 10/10/16 0948  Gross per 24 hour  Intake              775 ml  Output              800 ml  Net              -25 ml     Physical Exam: General:  Well appearing. No resp difficulty. In the bed HEENT: normal Neck: supple. JVP 9. Carotids 2+ bilat; no bruits. No lymphadenopathy or thryomegaly appreciated. Cor: PMI nondisplaced. Regular rate & rhythm. No rubs, gallops or murmurs. Lungs: clear Abdomen: soft, nontender, nondistended. No hepatosplenomegaly. No bruits or masses. Good bowel sounds. Extremities: no cyanosis, clubbing, rash, R and LLE trace-1+ edema Neuro: alert & orientedx3, cranial nerves grossly intact. moves all 4 extremities w/o difficulty. Affect pleasant  Telemetry: NSR 90s   Labs: Basic Metabolic Panel:  Recent Labs Lab 10/04/16 0936 10/09/16 0934 10/10/16 0221  NA 141 140 139  K 3.3* 3.4* 4.0  CL 106 108 104  CO2  26 24 26   GLUCOSE 119* 83 187*  BUN 13 13 15   CREATININE 0.89 0.95 1.14*  CALCIUM 8.6 9.0 9.0    Liver Function Tests: No results for input(s): AST, ALT, ALKPHOS, BILITOT, PROT, ALBUMIN in the last 168 hours. No results for input(s): LIPASE, AMYLASE in the last 168 hours. No results for input(s): AMMONIA in the last 168  hours.  CBC:  Recent Labs Lab 10/09/16 0934 10/10/16 0221  WBC 6.5 8.5  HGB 11.8* 12.8  HCT 36.9 39.5  MCV 87.0 87.8  PLT 220 221    Cardiac Enzymes:  Recent Labs Lab 10/09/16 1501 10/09/16 2039 10/10/16 0221  TROPONINI <0.03 <0.03 <0.03    BNP: BNP (last 3 results)  Recent Labs  07/03/16 1300 10/01/16 1829 10/09/16 1021  BNP 129.5* 364.6* 442.3*    ProBNP (last 3 results) No results for input(s): PROBNP in the last 8760 hours.   CBG: No results for input(s): GLUCAP in the last 168 hours.  Coagulation Studies: No results for input(s): LABPROT, INR in the last 72 hours.  Other results: EKG: In ED Sinus Tach 104 bpm   Imaging: Dg Chest 2 View  Result Date: 10/09/2016 CLINICAL DATA:  Cough and shortness of breath. EXAM: CHEST  2 VIEW COMPARISON:  10/01/2016 FINDINGS: There is increased cardiomegaly with new slight bilateral interstitial pulmonary edema. Pulmonary vascularity is normal. No discrete effusions. Bones are normal. IMPRESSION: Increased cardiomegaly.  Slight bilateral new pulmonary edema. Electronically Signed   By: Lorriane Shire M.D.   On: 10/09/2016 10:12      Medications:     Current Medications: . aspirin  162 mg Oral q morning - 10a  . carvedilol  25 mg Oral BID WC  . enoxaparin (LOVENOX) injection  40 mg Subcutaneous Q24H  . fluticasone furoate-vilanterol  1 puff Inhalation Daily  . folic acid  1 mg Oral q morning - 10a  . furosemide  60 mg Intravenous BID  . hydroxychloroquine  200 mg Oral BID  . irbesartan  150 mg Oral BID  . [START ON 10/11/2016] methotrexate  20 mg Oral Q Fri  . potassium chloride SA  60 mEq Oral Daily  . predniSONE  20 mg Oral Q breakfast  . sodium chloride flush  3 mL Intravenous Q12H  . sodium chloride flush  3 mL Intravenous Q12H  . [START ON 10/11/2016] Tocilizumab  162 mg Subcutaneous Q14 Days     Infusions:      Assessment:  1. A/C Systolic Heart Failure - NICM  2015 low risk Myoview 2.  Lupus 3. HTN 4. Asthma 5. Fibromyalgia 6. H/O Cervical Cancer   Plan/Discussion:    Ms Chmelik is a 45 year old admitted with chronic systolic heart failure, lupus, asthma and HTN admitted recurrent A/C Systolic Heart Failure. NICM with most recent ECHO 2017 EF 35-40%. Over the last few months she has struggled with volume overload. She has been on chronic steroids -->20 mg prednisone. No recent increase to correlate with weight gain.   Repeat ECHO. Volume status improving. Needs to continue IV lasix. Anticipate switching to torsemide once fully diuresed. Continue carvedilol 25 mg twice a day. Stop irbesartan and start entresto 49-51 mg twice a day. Add 12.5 mg spiro daily. Hold off on bidil. Renal function stable.   Would keep one more day. We will set up HF follow up in the HF clinic. Consult cardiac rehab.    Length of Stay: 0  Amy Clegg NP-C  10/10/2016, 10:19 AM  Advanced Heart Failure Team Pager [phone] (M-F; [place])  Please contact [facility] for night-coverage after hours (4p -7a ) and weekends on [email or web address]  Patient seen and examined with Darrick Grinder, NP. We discussed all aspects of the encounter. I agree with the assessment and plan as stated above.   45 y/o woman with SLE admitted with HF flare. Volume status improving however still reports NYHA III symptoms. Etiology of CM remains unclear. Has previously felt to be myocarditis related but no LGE on MRI. With SLE is at increased risk for CAD but I reviewed previous CT and no evidence coronary CA. Also Myoview normal recently and no scar on MRI. All make ischemic heart disease less likely. Will repeat echo. If EF down or PA pressures increasing will need R/L cath. Will continue to adjust HF meds with switch to [facility] and addition of spiro. Haroldine Laws, Daniel,MD 5:06 PM

## 2016-10-11 ENCOUNTER — Inpatient Hospital Stay (HOSPITAL_COMMUNITY): Payer: BLUE CROSS/BLUE SHIELD

## 2016-10-11 ENCOUNTER — Encounter (HOSPITAL_COMMUNITY)
Admission: EM | Disposition: A | Discharge status: Home / Self Care | Payer: Self-pay | Source: Home / Self Care | Attending: Internal Medicine

## 2016-10-11 DIAGNOSIS — I509 Heart failure, unspecified: Secondary | ICD-10-CM

## 2016-10-11 HISTORY — PX: CARDIAC CATHETERIZATION: SHX172

## 2016-10-11 LAB — ECHOCARDIOGRAM COMPLETE
Height: 67 in
Weight: 2968 oz

## 2016-10-11 LAB — POCT I-STAT 3, VENOUS BLOOD GAS (G3P V)
Acid-Base Excess: 3 mmol/L — ABNORMAL HIGH (ref 0.0–2.0)
Acid-Base Excess: 3 mmol/L — ABNORMAL HIGH (ref 0.0–2.0)
Bicarbonate: 29.1 mmol/L — ABNORMAL HIGH (ref 20.0–28.0)
Bicarbonate: 29.6 mmol/L — ABNORMAL HIGH (ref 20.0–28.0)
O2 Saturation: 59 %
O2 Saturation: 62 %
TCO2: 31 mmol/L (ref 0–100)
TCO2: 31 mmol/L (ref 0–100)
pCO2, Ven: 50.9 mmHg (ref 44.0–60.0)
pCO2, Ven: 53.1 mmHg (ref 44.0–60.0)
pH, Ven: 7.354 (ref 7.250–7.430)
pH, Ven: 7.364 (ref 7.250–7.430)
pO2, Ven: 33 mmHg (ref 32.0–45.0)
pO2, Ven: 34 mmHg (ref 32.0–45.0)

## 2016-10-11 LAB — CBC
HCT: 44.2 % (ref 36.0–46.0)
HCT: 44.2 % (ref 36.0–46.0)
Hemoglobin: 14.3 g/dL (ref 12.0–15.0)
Hemoglobin: 14.3 g/dL (ref 12.0–15.0)
MCH: 28.6 pg (ref 26.0–34.0)
MCH: 28.7 pg (ref 26.0–34.0)
MCHC: 32.4 g/dL (ref 30.0–36.0)
MCHC: 32.4 g/dL (ref 30.0–36.0)
MCV: 88.4 fL (ref 78.0–100.0)
MCV: 88.6 fL (ref 78.0–100.0)
Platelets: 269 10*3/uL (ref 150–400)
Platelets: 273 10*3/uL (ref 150–400)
RBC: 4.99 MIL/uL (ref 3.87–5.11)
RBC: 5 MIL/uL (ref 3.87–5.11)
RDW: 13.9 % (ref 11.5–15.5)
RDW: 14 % (ref 11.5–15.5)
WBC: 11.1 10*3/uL — ABNORMAL HIGH (ref 4.0–10.5)
WBC: 11.3 10*3/uL — ABNORMAL HIGH (ref 4.0–10.5)

## 2016-10-11 LAB — GLUCOSE, CAPILLARY
Glucose-Capillary: 99 mg/dL (ref 65–99)
Glucose-Capillary: 144 mg/dL — ABNORMAL HIGH (ref 65–99)
Glucose-Capillary: 166 mg/dL — ABNORMAL HIGH (ref 65–99)
Glucose-Capillary: 133 mg/dL — ABNORMAL HIGH (ref 65–99)

## 2016-10-11 LAB — PROTIME-INR
INR: 1.09
Prothrombin Time: 14.1 seconds (ref 11.4–15.2)

## 2016-10-11 LAB — BASIC METABOLIC PANEL
Anion gap: 10 (ref 5–15)
BUN: 20 mg/dL (ref 6–20)
CO2: 28 mmol/L (ref 22–32)
Calcium: 9.4 mg/dL (ref 8.9–10.3)
Chloride: 100 mmol/L — ABNORMAL LOW (ref 101–111)
Creatinine, Ser: 1.02 mg/dL — ABNORMAL HIGH (ref 0.44–1.00)
GFR calc Af Amer: >60 mL/min (ref >60)
GFR calc non Af Amer: >60 mL/min (ref >60)
Glucose, Bld: 123 mg/dL — ABNORMAL HIGH (ref 65–99)
Potassium: 3.9 mmol/L (ref 3.5–5.1)
Sodium: 138 mmol/L (ref 135–145)

## 2016-10-11 LAB — HEMOGLOBIN A1C
Hgb A1c MFr Bld: 6 % — ABNORMAL HIGH (ref 4.8–5.6)
Mean Plasma Glucose: 126 mg/dL

## 2016-10-11 LAB — CREATININE, SERUM
Creatinine, Ser: 0.92 mg/dL (ref 0.44–1.00)
GFR calc Af Amer: >60 mL/min (ref >60)
GFR calc non Af Amer: >60 mL/min (ref >60)

## 2016-10-11 SURGERY — RIGHT/LEFT HEART CATH AND CORONARY ANGIOGRAPHY
Anesthesia: LOCAL

## 2016-10-11 MED ORDER — ENOXAPARIN SODIUM 40 MG/0.4ML ~~LOC~~ SOLN
40.0000 mg | SUBCUTANEOUS | Status: DC
  Filled 2016-10-11 (×2): qty 0.4

## 2016-10-11 MED ORDER — SODIUM CHLORIDE 0.9% FLUSH
3.0000 mL | Freq: Two times a day (BID) | INTRAVENOUS | Status: DC
  Administered 2016-10-11: 3 mL via INTRAVENOUS

## 2016-10-11 MED ORDER — SODIUM CHLORIDE 0.9 % IV SOLN: 250.0000 mL | INTRAVENOUS | Status: DC | PRN

## 2016-10-11 MED ORDER — ACETAMINOPHEN 325 MG PO TABS: 650.0000 mg | ORAL_TABLET | ORAL | Status: DC | PRN

## 2016-10-11 MED ORDER — SODIUM CHLORIDE 0.9 % IV SOLN: INTRAVENOUS | Status: AC

## 2016-10-11 MED ORDER — FUROSEMIDE 10 MG/ML IJ SOLN
INTRAMUSCULAR | Status: AC
  Filled 2016-10-11: qty 8

## 2016-10-11 MED ORDER — PERFLUTREN LIPID MICROSPHERE
INTRAVENOUS | Status: AC
  Filled 2016-10-11: qty 10

## 2016-10-11 MED ORDER — ONDANSETRON HCL 4 MG/2ML IJ SOLN: 4.0000 mg | Freq: Four times a day (QID) | INTRAMUSCULAR | Status: DC | PRN

## 2016-10-11 MED ORDER — LIDOCAINE HCL (PF) 1 % IJ SOLN
INTRAMUSCULAR | Status: DC | PRN
  Administered 2016-10-11: 30 mL

## 2016-10-11 MED ORDER — FENTANYL CITRATE (PF) 100 MCG/2ML IJ SOLN
INTRAMUSCULAR | Status: DC | PRN
  Administered 2016-10-11: 25 ug via INTRAVENOUS

## 2016-10-11 MED ORDER — MIDAZOLAM HCL 2 MG/2ML IJ SOLN
INTRAMUSCULAR | Status: AC
  Filled 2016-10-11: qty 2

## 2016-10-11 MED ORDER — FENTANYL CITRATE (PF) 100 MCG/2ML IJ SOLN
INTRAMUSCULAR | Status: AC
  Filled 2016-10-11: qty 2

## 2016-10-11 MED ORDER — FUROSEMIDE 10 MG/ML IJ SOLN
80.0000 mg | Freq: Two times a day (BID) | INTRAMUSCULAR | Status: DC
  Administered 2016-10-11: 80 mg via INTRAVENOUS
  Filled 2016-10-11: qty 8

## 2016-10-11 MED ORDER — SODIUM CHLORIDE 0.9% FLUSH
3.0000 mL | INTRAVENOUS | Status: DC | PRN
  Administered 2016-10-13: 3 mL via INTRAVENOUS
  Filled 2016-10-11: qty 3

## 2016-10-11 MED ORDER — SODIUM CHLORIDE 0.9% FLUSH
3.0000 mL | Freq: Two times a day (BID) | INTRAVENOUS | Status: DC
  Administered 2016-10-12 – 2016-10-13 (×2): 3 mL via INTRAVENOUS

## 2016-10-11 MED ORDER — SODIUM CHLORIDE 0.9% FLUSH: 3.0000 mL | INTRAVENOUS | Status: DC | PRN

## 2016-10-11 MED ORDER — PERFLUTREN LIPID MICROSPHERE
1.0000 mL | INTRAVENOUS | Status: AC | PRN
  Administered 2016-10-11: 2 mL via INTRAVENOUS
  Filled 2016-10-11: qty 10

## 2016-10-11 MED ORDER — MIDAZOLAM HCL 2 MG/2ML IJ SOLN
INTRAMUSCULAR | Status: DC | PRN
  Administered 2016-10-11: 2 mg via INTRAVENOUS
  Administered 2016-10-11: 1 mg via INTRAVENOUS

## 2016-10-11 MED ORDER — HEPARIN SODIUM (PORCINE) 1000 UNIT/ML IJ SOLN
INTRAMUSCULAR | Status: AC
  Filled 2016-10-11: qty 1

## 2016-10-11 MED ORDER — LIDOCAINE HCL (PF) 1 % IJ SOLN
INTRAMUSCULAR | Status: AC
  Filled 2016-10-11: qty 30

## 2016-10-11 MED ORDER — HEPARIN (PORCINE) IN NACL 2-0.9 UNIT/ML-% IJ SOLN
INTRAMUSCULAR | Status: AC
  Filled 2016-10-11: qty 1000

## 2016-10-11 MED ORDER — HEPARIN (PORCINE) IN NACL 2-0.9 UNIT/ML-% IJ SOLN
INTRAMUSCULAR | Status: DC | PRN
  Administered 2016-10-11: 1000 mL

## 2016-10-11 MED ORDER — VERAPAMIL HCL 2.5 MG/ML IV SOLN
INTRAVENOUS | Status: AC
  Filled 2016-10-11: qty 2

## 2016-10-11 MED ORDER — SODIUM CHLORIDE 0.9 % IV SOLN: INTRAVENOUS | Status: DC

## 2016-10-11 SURGICAL SUPPLY — 8 items
CATH INFINITI 5FR MULTPACK ANG (CATHETERS) ×2 IMPLANT
CATH SWAN GANZ 7F STRAIGHT (CATHETERS) ×2 IMPLANT
KIT HEART LEFT (KITS) ×2 IMPLANT
KIT HEART RIGHT NAMIC (KITS) ×2 IMPLANT
PACK CARDIAC CATHETERIZATION (CUSTOM PROCEDURE TRAY) ×2 IMPLANT
SHEATH PINNACLE 5F 10CM (SHEATH) ×2 IMPLANT
SHEATH PINNACLE 7F 10CM (SHEATH) ×2 IMPLANT
TRANSDUCER W/STOPCOCK (MISCELLANEOUS) ×4 IMPLANT

## 2016-10-11 NOTE — Progress Notes (Signed)
Advanced Heart Failure Rounding Note   Subjective:    Breathin better with diuresis. Weight down 6 pounds. Echo today EF 20%.   Objective:   Weight Range:  Vital Signs:   Temp:  [98.1 F (36.7 C)-98.2 F (36.8 C)] 98.2 F (36.8 C) (10/20 0835) Pulse Rate:  [83-88] 88 (10/20 0835) Resp:  [17-18] 17 (10/20 0835) BP: (109-113)/(77-85) 110/81 (10/20 0835) SpO2:  [97 %-100 %] 100 % (10/20 0835) Weight:  [185 lb 6.5 oz (84.1 kg)-185 lb 8 oz (84.1 kg)] 185 lb 6.5 oz (84.1 kg) (10/20 1126) Last BM Date: 10/10/16  Weight change: Filed Weights   10/10/16 0445 10/11/16 0506 10/11/16 1126  Weight: 191 lb 1.6 oz (86.7 kg) 185 lb 8 oz (84.1 kg) 185 lb 6.5 oz (84.1 kg)    Intake/Output:   Intake/Output Summary (Last 24 hours) at 10/11/16 1305 Last data filed at 10/11/16 0913  Gross per 24 hour  Intake              323 ml  Output             1750 ml  Net            -1427 ml     Physical Exam: General:  Well appearing. No resp difficulty HEENT: normal x for mild cushingoid appearance Neck: supple. JVP 7. Carotids 2+ bilat; no bruits. No lymphadenopathy or thryomegaly appreciated. Cor: PMI laterally displaced. Regular rate & rhythm. No rubs, gallops or murmurs. Lungs: clear Abdomen: soft, nontender, nondistended. No hepatosplenomegaly. No bruits or masses. Good bowel sounds. Extremities: no cyanosis, clubbing, rash, trace edema Neuro: alert & orientedx3, cranial nerves grossly intact. moves all 4 extremities w/o difficulty. Affect pleasant  Telemetry: NSR  Labs: Basic Metabolic Panel:  Recent Labs Lab 10/09/16 0934 10/10/16 0221 10/11/16 0245  NA 140 139 138  K 3.4* 4.0 3.9  CL 108 104 100*  CO2 24 26 28   GLUCOSE 83 187* 123*  BUN 13 15 20   CREATININE 0.95 1.14* 1.02*  CALCIUM 9.0 9.0 9.4    Liver Function Tests: No results for input(s): AST, ALT, ALKPHOS, BILITOT, PROT, ALBUMIN in the last 168 hours. No results for input(s): LIPASE, AMYLASE in the last 168  hours. No results for input(s): AMMONIA in the last 168 hours.  CBC:  Recent Labs Lab 10/09/16 0934 10/10/16 0221  WBC 6.5 8.5  HGB 11.8* 12.8  HCT 36.9 39.5  MCV 87.0 87.8  PLT 220 221    Cardiac Enzymes:  Recent Labs Lab 10/09/16 1501 10/09/16 2039 10/10/16 0221  TROPONINI <0.03 <0.03 <0.03    BNP: BNP (last 3 results)  Recent Labs  07/03/16 1300 10/01/16 1829 10/09/16 1021  BNP 129.5* 364.6* 442.3*    ProBNP (last 3 results) No results for input(s): PROBNP in the last 8760 hours.    Other results:  Imaging:  No results found.   Medications:     Scheduled Medications: . [MAR Hold] aspirin  162 mg Oral q morning - 10a  . [MAR Hold] carvedilol  25 mg Oral BID WC  . [MAR Hold] enoxaparin (LOVENOX) injection  40 mg Subcutaneous Q24H  . [MAR Hold] fluticasone furoate-vilanterol  1 puff Inhalation Daily  . [MAR Hold] folic acid  1 mg Oral q morning - 10a  . [MAR Hold] hydroxychloroquine  200 mg Oral BID  . [MAR Hold] insulin aspart  0-5 Units Subcutaneous QHS  . [MAR Hold] insulin aspart  0-9 Units Subcutaneous TID WC  . [  MAR Hold] methotrexate  20 mg Oral Q Fri  . perflutren lipid microspheres (DEFINITY) IV suspension      . [MAR Hold] potassium chloride SA  60 mEq Oral Daily  . [MAR Hold] predniSONE  20 mg Oral Q breakfast  . [MAR Hold] sacubitril-valsartan  1 tablet Oral BID  . [MAR Hold] sodium chloride flush  3 mL Intravenous Q12H  . [MAR Hold] sodium chloride flush  3 mL Intravenous Q12H  . sodium chloride flush  3 mL Intravenous Q12H  . [MAR Hold] spironolactone  12.5 mg Oral Daily     Infusions: . [START ON 10/12/2016] sodium chloride       PRN Medications:  [MAR Hold] sodium chloride, sodium chloride, [MAR Hold] acetaminophen **OR** [MAR Hold] acetaminophen, [MAR Hold] albuterol, [MAR Hold] guaiFENesin, [MAR Hold] hydrALAZINE, [MAR Hold] meloxicam, [MAR Hold] ondansetron **OR** [MAR Hold] ondansetron (ZOFRAN) IV, [MAR Hold] sodium  chloride, [MAR Hold] sodium chloride flush, sodium chloride flush, [MAR Hold] traMADol, [MAR Hold] traZODone   Assessment:   1. A/C Systolic Heart Failure - NICM  2015 low risk Myoview   --Cardiac MR in 02/2015 showed EF 46%. No LGE.  2D ECHO 12/2015 showed mild LVH, EF 35-40%, diff HK, normal diastolic function, mild MR, trivial pericardial effusion.Her last ischemic evaluation was in 08/2014 and was a low-risk NST.   --Echo today EF 20% 2. Lupus 3. HTN 4. Asthma 5. Fibromyalgia 6. H/O Cervical Cancer    Plan/Discussion:    Breathing better with diuresis but echo reviewed personally and LVEF now in 20% range. I discussed with Dr. Johnsie Cancel and we agree that she will need R and L heart cath today. I discussed this with her and her daughter. If no CAD may need to consider repeat cMRI but had severe reaction to Gadolinium with last test in 3/16 so may not be possible.   Continue carvedilol and Entresto. May need to start thinking about w/u for advanced therapies   Length of Stay: 1   Bensimhon, Daniel MD 10/11/2016, 1:05 PM  Advanced Heart Failure Team Pager [phone] (M-F; 7a - 4p)  Please contact [facility] for night-coverage after hours (4p -7a ) and weekends on [email or web address]

## 2016-10-11 NOTE — H&P (View-Only) (Signed)
Advanced Heart Failure Rounding Note   Subjective:    Breathin better with diuresis. Weight down 6 pounds. Echo today EF 20%.   Objective:   Weight Range:  Vital Signs:   Temp:  [98.1 F (36.7 C)-98.2 F (36.8 C)] 98.2 F (36.8 C) (10/20 0835) Pulse Rate:  [83-88] 88 (10/20 0835) Resp:  [17-18] 17 (10/20 0835) BP: (109-113)/(77-85) 110/81 (10/20 0835) SpO2:  [97 %-100 %] 100 % (10/20 0835) Weight:  [185 lb 6.5 oz (84.1 kg)-185 lb 8 oz (84.1 kg)] 185 lb 6.5 oz (84.1 kg) (10/20 1126) Last BM Date: 10/10/16  Weight change: Filed Weights   10/10/16 0445 10/11/16 0506 10/11/16 1126  Weight: 191 lb 1.6 oz (86.7 kg) 185 lb 8 oz (84.1 kg) 185 lb 6.5 oz (84.1 kg)    Intake/Output:   Intake/Output Summary (Last 24 hours) at 10/11/16 1305 Last data filed at 10/11/16 0913  Gross per 24 hour  Intake              323 ml  Output             1750 ml  Net            -1427 ml     Physical Exam: General:  Well appearing. No resp difficulty HEENT: normal x for mild cushingoid appearance Neck: supple. JVP 7. Carotids 2+ bilat; no bruits. No lymphadenopathy or thryomegaly appreciated. Cor: PMI laterally displaced. Regular rate & rhythm. No rubs, gallops or murmurs. Lungs: clear Abdomen: soft, nontender, nondistended. No hepatosplenomegaly. No bruits or masses. Good bowel sounds. Extremities: no cyanosis, clubbing, rash, trace edema Neuro: alert & orientedx3, cranial nerves grossly intact. moves all 4 extremities w/o difficulty. Affect pleasant  Telemetry: NSR  Labs: Basic Metabolic Panel:  Recent Labs Lab 10/09/16 0934 10/10/16 0221 10/11/16 0245  NA 140 139 138  K 3.4* 4.0 3.9  CL 108 104 100*  CO2 24 26 28   GLUCOSE 83 187* 123*  BUN 13 15 20   CREATININE 0.95 1.14* 1.02*  CALCIUM 9.0 9.0 9.4    Liver Function Tests: No results for input(s): AST, ALT, ALKPHOS, BILITOT, PROT, ALBUMIN in the last 168 hours. No results for input(s): LIPASE, AMYLASE in the last 168  hours. No results for input(s): AMMONIA in the last 168 hours.  CBC:  Recent Labs Lab 10/09/16 0934 10/10/16 0221  WBC 6.5 8.5  HGB 11.8* 12.8  HCT 36.9 39.5  MCV 87.0 87.8  PLT 220 221    Cardiac Enzymes:  Recent Labs Lab 10/09/16 1501 10/09/16 2039 10/10/16 0221  TROPONINI <0.03 <0.03 <0.03    BNP: BNP (last 3 results)  Recent Labs  07/03/16 1300 10/01/16 1829 10/09/16 1021  BNP 129.5* 364.6* 442.3*    ProBNP (last 3 results) No results for input(s): PROBNP in the last 8760 hours.    Other results:  Imaging:  No results found.   Medications:     Scheduled Medications: . [MAR Hold] aspirin  162 mg Oral q morning - 10a  . [MAR Hold] carvedilol  25 mg Oral BID WC  . [MAR Hold] enoxaparin (LOVENOX) injection  40 mg Subcutaneous Q24H  . [MAR Hold] fluticasone furoate-vilanterol  1 puff Inhalation Daily  . [MAR Hold] folic acid  1 mg Oral q morning - 10a  . [MAR Hold] hydroxychloroquine  200 mg Oral BID  . [MAR Hold] insulin aspart  0-5 Units Subcutaneous QHS  . [MAR Hold] insulin aspart  0-9 Units Subcutaneous TID WC  . [  MAR Hold] methotrexate  20 mg Oral Q Fri  . perflutren lipid microspheres (DEFINITY) IV suspension      . [MAR Hold] potassium chloride SA  60 mEq Oral Daily  . [MAR Hold] predniSONE  20 mg Oral Q breakfast  . [MAR Hold] sacubitril-valsartan  1 tablet Oral BID  . [MAR Hold] sodium chloride flush  3 mL Intravenous Q12H  . [MAR Hold] sodium chloride flush  3 mL Intravenous Q12H  . sodium chloride flush  3 mL Intravenous Q12H  . [MAR Hold] spironolactone  12.5 mg Oral Daily     Infusions: . [START ON 10/12/2016] sodium chloride       PRN Medications:  [MAR Hold] sodium chloride, sodium chloride, [MAR Hold] acetaminophen **OR** [MAR Hold] acetaminophen, [MAR Hold] albuterol, [MAR Hold] guaiFENesin, [MAR Hold] hydrALAZINE, [MAR Hold] meloxicam, [MAR Hold] ondansetron **OR** [MAR Hold] ondansetron (ZOFRAN) IV, [MAR Hold] sodium  chloride, [MAR Hold] sodium chloride flush, sodium chloride flush, [MAR Hold] traMADol, [MAR Hold] traZODone   Assessment:   1. A/C Systolic Heart Failure - NICM  2015 low risk Myoview   --Cardiac MR in 02/2015 showed EF 46%. No LGE.  2D ECHO 12/2015 showed mild LVH, EF 35-40%, diff HK, normal diastolic function, mild MR, trivial pericardial effusion.Her last ischemic evaluation was in 08/2014 and was a low-risk NST.   --Echo today EF 20% 2. Lupus 3. HTN 4. Asthma 5. Fibromyalgia 6. H/O Cervical Cancer    Plan/Discussion:    Breathing better with diuresis but echo reviewed personally and LVEF now in 20% range. I discussed with Dr. Johnsie Cancel and we agree that she will need R and L heart cath today. I discussed this with her and her daughter. If no CAD may need to consider repeat cMRI but had severe reaction to Gadolinium with last test in 3/16 so may not be possible.   Continue carvedilol and Entresto. May need to start thinking about w/u for advanced therapies   Length of Stay: 1   Bensimhon, Daniel MD 10/11/2016, 1:05 PM  Advanced Heart Failure Team Pager [phone] (M-F; 7a - 4p)  Please contact [facility] for night-coverage after hours (4p -7a ) and weekends on [email or web address]

## 2016-10-11 NOTE — Progress Notes (Signed)
  Echocardiogram 2D Echocardiogram has been performed with definity.  Aggie Cosier 10/11/2016, 8:38 AM

## 2016-10-11 NOTE — Interval H&P Note (Signed)
History and Physical Interval Note:  10/11/2016 1:11 PM  Christine Mcdowell  has presented today for surgery, with the diagnosis of chf  The various methods of treatment have been discussed with the patient and family. After consideration of risks, benefits and other options for treatment, the patient has consented to  Procedure(s): Right/Left Heart Cath and Coronary Angiography (N/A) as a surgical intervention .  The patient's history has been reviewed, patient examined, no change in status, stable for surgery.  I have reviewed the patient's chart and labs.  Questions were answered to the patient's satisfaction.    Cath Lab Visit (complete for each Cath Lab visit)  Clinical Evaluation Leading to the Procedure:   ACS: No.  Non-ACS:    Anginal Classification: CCS III  Anti-ischemic medical therapy: Maximal Therapy (2 or more classes of medications)  Non-Invasive Test Results: No non-invasive testing performed  Prior CABG: No previous CABG        Bensimhon, Quillian Quince

## 2016-10-11 NOTE — CV Procedure (Signed)
CARDIAC CATH RESULTS  Findings:  Ao = 105/80 (91) LV = 102/11 RA = 4 RV = 24/5 PA = 23/4 (16) PCW = 9 Fick cardiac output/index = 4.4/2.2 SVR = 1580 PVR =  1.5 WU Ao sat = 95% PA sat = 58%, 62%  Assessment: 1. Normal coronary arteries with separate ostial for LAD and LCX 2. Normal right heart pressures 3. NICM with EF ~20% by echo  Plan/Discussion:  Continue medical therapy. Consider repeat cMRI as tolerated   Bensimhon, Daniel,MD 2:07 PM

## 2016-10-11 NOTE — Interval H&P Note (Signed)
History and Physical Interval Note:  10/11/2016 1:11 PM  Christine Mcdowell  has presented today for surgery, with the diagnosis of chf  The various methods of treatment have been discussed with the patient and family. After consideration of risks, benefits and other options for treatment, the patient has consented to  Procedure(s): Right/Left Heart Cath and Coronary Angiography (N/A) and possible coronary angioplasty as a surgical intervention .  The patient's history has been reviewed, patient examined, no change in status, stable for surgery.  I have reviewed the patient's chart and labs.  Questions were answered to the patient's satisfaction.     Bensimhon, Quillian Quince

## 2016-10-11 NOTE — Progress Notes (Signed)
PROGRESS NOTE    Christine Mcdowell  [id number] DOB: 05-08-1971 DOA: 10/09/2016 PCP: Maximino Greenland, MD   Chief Complaint  Patient presents with  . Shortness of Breath    Brief Narrative:  HPI on 10/09/2016 by Dr. Linna Darner [name] is a 45 y.o. female with medical history significant of cervical cancer, CHF, DM, discoid lupus, fibromyalgia, HTN, iron deficiency anemia, migraines, rheumatoid arthritis, sickle cell trait, CVA presenting with a primary complaint of shortness of breath and occasional wheezing. Shortness of breath started around 4 AM. Constant. Getting worse. Associated with lower extremity swelling bilaterally and increasing orthopnea over the last several days. Denies any fevers, chest pain, palpitations, abdominal pain, dysuria, frequency, polydipsia, rash, neck stiffness, headache. Endorses occasional cough with infrequent phlegm production over the last 2-3 weeks which patient felt was likely due to allergies. Patient did not take her Lasix on day of admission but states she is typically compliant with her medical regimen. Symptoms are worsened with ambulation and improved with rest while sitting up. Since her symptoms are similar to her previous CHF exacerbations. 6 pound weight gain. Dry weight 189.  Assessment & Plan   Acute on chronic systolic congestive heart failure -Last echo 12/2015 showed an EF 35-40% -patient states she was recently admitted for the same  -patient has noted weight gain as well as generalized swelling -BNP 442.3 -CXR: increased cardiomegaly, pulmonary edema -Cardiology consulted and appreciated -Continue IV lasix -monitor intake/output, daily weights -UOP over past 24hrs 2550cc, weight down 10lbs since admission -States her normal weight is 189lbs -Repeat echocardiogram pending -Continue coreg, Stann Mainland and spironolactone -Possible cath today  Lupus/Rheumatoid arthritis -No evidence of acute flare -Continue methotrexate,  prednisone, Plaquenil, prednisone, actemra  Hypertension -Continue Coreg -Continue diuretics, started on ernesto  Asthma -No evidence of acute flare the patient has endorsed recent intermittent wheezing. Patient may be developing some pulmonary fibrosis which is causing her worst dyspnea. -Will need outpatient PFT -Continue albuterol prn and Breo -May need outpatient sleep eval  Chronic musculoskeletal pain/fibromyalgia  -Continue mobic, tramadol PRN  Distant history of diabetes -Recent glucose readings all normal. Not on any medicines nor endorsing diet control. -A1c 6.0 -Continue on ISS with CBG monitoring   DVT Prophylaxis  Lovenox  Code Status: Full  Family Communication: none at bedside  Disposition Plan: Admitted. Pending cath today. Continue CHF treatment.   Consultants Cardiology  Procedures  None  Antibiotics   Anti-infectives    Start     Dose/Rate Route Frequency Ordered Stop   10/09/16 2200  hydroxychloroquine (PLAQUENIL) tablet 200 mg     200 mg Oral 2 times daily 10/09/16 1441        Subjective:   Christine Mcdowell seen and examined today.  Feels breathing has improved since admission, but not back to her baseline.  Feels very sad today, given the news that her heart failure may be worse.  Denies chest pain, abdominal pain, N/V/D/C, dizziness, headache.  Objective:   Vitals:   10/10/16 2230 10/11/16 0506 10/11/16 0835 10/11/16 1126  BP: 109/77 113/85 110/81   Pulse: 88 83 88   Resp: 18 18 17    Temp: 98.1 F (36.7 C) 98.2 F (36.8 C) 98.2 F (36.8 C)   TempSrc: Oral Oral Oral   SpO2: 98% 97% 100%   Weight:  84.1 kg (185 lb 8 oz)  84.1 kg (185 lb 6.5 oz)  Height:        Intake/Output Summary (Last 24 hours) at 10/11/16 1232 Last data  filed at 10/11/16 0913  Gross per 24 hour  Intake              323 ml  Output             1750 ml  Net            -1427 ml   Filed Weights   10/10/16 0445 10/11/16 0506 10/11/16 1126  Weight: 86.7  kg (191 lb 1.6 oz) 84.1 kg (185 lb 8 oz) 84.1 kg (185 lb 6.5 oz)    Exam  General: Well developed, well nourished, NAD, appears stated age  HEENT: NCAT, mucous membranes moist.   Cardiovascular: S1 S2 auscultated, no murmurs,RRR  Respiratory: Clear to auscultation bilaterally with equal chest rise  Abdomen: Soft, nontender, nondistended, + bowel sounds  Extremities: warm dry without cyanosis clubbing. +LE edema  Neuro: AAOx3, nonfocal  Skin: Without rashes exudates or nodules  Psych: Upset/tearful, but appropriate mood and affect   Data Reviewed: I have personally reviewed following labs and imaging studies  CBC:  Recent Labs Lab 10/09/16 0934 10/10/16 0221  WBC 6.5 8.5  HGB 11.8* 12.8  HCT 36.9 39.5  MCV 87.0 87.8  PLT 220 [id number]   Basic Metabolic Panel:  Recent Labs Lab 10/09/16 0934 10/10/16 0221 10/11/16 0245  NA 140 139 138  K 3.4* 4.0 3.9  CL 108 104 100*  CO2 24 26 28   GLUCOSE 83 187* 123*  BUN 13 15 20   CREATININE 0.95 1.14* 1.02*  CALCIUM 9.0 9.0 9.4   GFR: Estimated Creatinine Clearance: 76 mL/min (by C-G formula based on SCr of 1.02 mg/dL (H)). Liver Function Tests: No results for input(s): AST, ALT, ALKPHOS, BILITOT, PROT, ALBUMIN in the last 168 hours. No results for input(s): LIPASE, AMYLASE in the last 168 hours. No results for input(s): AMMONIA in the last 168 hours. Coagulation Profile: No results for input(s): INR, PROTIME in the last 168 hours. Cardiac Enzymes:  Recent Labs Lab 10/09/16 1501 10/09/16 2039 10/10/16 0221  TROPONINI <0.03 <0.03 <0.03   BNP (last 3 results) No results for input(s): PROBNP in the last 8760 hours. HbA1C:  Recent Labs  10/10/16 1429  HGBA1C 6.0*   CBG:  Recent Labs Lab 10/10/16 1648 10/10/16 2228 10/11/16 0722 10/11/16 1148  GLUCAP 167* 116* 99 144*   Lipid Profile: No results for input(s): CHOL, HDL, LDLCALC, TRIG, CHOLHDL, LDLDIRECT in the last 72 hours. Thyroid Function  Tests:  Recent Labs  10/09/16 1501  TSH 0.929   Anemia Panel: No results for input(s): VITAMINB12, FOLATE, FERRITIN, TIBC, IRON, RETICCTPCT in the last 72 hours. Urine analysis:    Component Value Date/Time   COLORURINE YELLOW 07/03/2016 1949   APPEARANCEUR CLEAR 07/03/2016 1949   LABSPEC 1.021 07/03/2016 1949   PHURINE 6.5 07/03/2016 1949   GLUCOSEU NEGATIVE 07/03/2016 1949   HGBUR NEGATIVE 07/03/2016 1949   BILIRUBINUR NEGATIVE 07/03/2016 1949   KETONESUR NEGATIVE 07/03/2016 1949   PROTEINUR NEGATIVE 07/03/2016 1949   UROBILINOGEN 1.0 05/29/2015 0536   NITRITE NEGATIVE 07/03/2016 1949   LEUKOCYTESUR NEGATIVE 07/03/2016 1949   Sepsis Labs: @LABRCNTIP (procalcitonin:4,lacticidven:4)  )No results found for this or any previous visit (from the past 240 hour(s)).    Radiology Studies: No results found.   Scheduled Meds: . aspirin  162 mg Oral q morning - 10a  . carvedilol  25 mg Oral BID WC  . enoxaparin (LOVENOX) injection  40 mg Subcutaneous Q24H  . fluticasone furoate-vilanterol  1 puff Inhalation Daily  . folic  acid  1 mg Oral q morning - 10a  . hydroxychloroquine  200 mg Oral BID  . insulin aspart  0-5 Units Subcutaneous QHS  . insulin aspart  0-9 Units Subcutaneous TID WC  . methotrexate  20 mg Oral Q Fri  . perflutren lipid microspheres (DEFINITY) IV suspension      . potassium chloride SA  60 mEq Oral Daily  . predniSONE  20 mg Oral Q breakfast  . sacubitril-valsartan  1 tablet Oral BID  . sodium chloride flush  3 mL Intravenous Q12H  . sodium chloride flush  3 mL Intravenous Q12H  . sodium chloride flush  3 mL Intravenous Q12H  . spironolactone  12.5 mg Oral Daily   Continuous Infusions: . [START ON 10/12/2016] sodium chloride       LOS: 1 day   Time Spent in minutes   30 minutes  Solace Wendorff D.O. on 10/11/2016 at 12:32 PM  Between 7am to 7pm - Pager - [phone]  After 7pm go to [email or web address] - password [email or web address]  And look for the night  coverage person covering for me after hours  [facility] Office  [phone]

## 2016-10-12 ENCOUNTER — Inpatient Hospital Stay (HOSPITAL_COMMUNITY): Payer: BLUE CROSS/BLUE SHIELD

## 2016-10-12 DIAGNOSIS — J9601 Acute respiratory failure with hypoxia: Secondary | ICD-10-CM

## 2016-10-12 DIAGNOSIS — M329 Systemic lupus erythematosus, unspecified: Secondary | ICD-10-CM

## 2016-10-12 DIAGNOSIS — J9621 Acute and chronic respiratory failure with hypoxia: Secondary | ICD-10-CM

## 2016-10-12 LAB — TROPONIN I: Troponin I: <0.03 ng/mL (ref <0.03)

## 2016-10-12 LAB — BLOOD GAS, ARTERIAL
Acid-Base Excess: 0.1 mmol/L (ref 0.0–2.0)
Bicarbonate: 23 mmol/L (ref 20.0–28.0)
Drawn by: 441261
FIO2: 1
O2 Saturation: 99.5 %
Patient temperature: 98.6
pCO2 arterial: 29.5 mmHg — ABNORMAL LOW (ref 32.0–48.0)
pH, Arterial: 7.503 — ABNORMAL HIGH (ref 7.350–7.450)
pO2, Arterial: 343 mmHg — ABNORMAL HIGH (ref 83.0–108.0)

## 2016-10-12 LAB — CBC
HCT: 42.9 % (ref 36.0–46.0)
Hemoglobin: 13.7 g/dL (ref 12.0–15.0)
MCH: 28.4 pg (ref 26.0–34.0)
MCHC: 31.9 g/dL (ref 30.0–36.0)
MCV: 89 fL (ref 78.0–100.0)
Platelets: 246 10*3/uL (ref 150–400)
RBC: 4.82 MIL/uL (ref 3.87–5.11)
RDW: 14.1 % (ref 11.5–15.5)
WBC: 12.7 10*3/uL — ABNORMAL HIGH (ref 4.0–10.5)

## 2016-10-12 LAB — BASIC METABOLIC PANEL
Anion gap: 9 (ref 5–15)
BUN: 24 mg/dL — ABNORMAL HIGH (ref 6–20)
CO2: 28 mmol/L (ref 22–32)
Calcium: 9.3 mg/dL (ref 8.9–10.3)
Chloride: 101 mmol/L (ref 101–111)
Creatinine, Ser: 1.45 mg/dL — ABNORMAL HIGH (ref 0.44–1.00)
GFR calc Af Amer: 49 mL/min — ABNORMAL LOW (ref >60)
GFR calc non Af Amer: 42 mL/min — ABNORMAL LOW (ref >60)
Glucose, Bld: 102 mg/dL — ABNORMAL HIGH (ref 65–99)
Potassium: 4.4 mmol/L (ref 3.5–5.1)
Sodium: 138 mmol/L (ref 135–145)

## 2016-10-12 LAB — GLUCOSE, CAPILLARY
Glucose-Capillary: 129 mg/dL — ABNORMAL HIGH (ref 65–99)
Glucose-Capillary: 149 mg/dL — ABNORMAL HIGH (ref 65–99)
Glucose-Capillary: 173 mg/dL — ABNORMAL HIGH (ref 65–99)
Glucose-Capillary: 109 mg/dL — ABNORMAL HIGH (ref 65–99)

## 2016-10-12 LAB — BRAIN NATRIURETIC PEPTIDE: B Natriuretic Peptide: 75.1 pg/mL (ref 0.0–100.0)

## 2016-10-12 MED ORDER — IPRATROPIUM-ALBUTEROL 0.5-2.5 (3) MG/3ML IN SOLN
3.0000 mL | Freq: Two times a day (BID) | RESPIRATORY_TRACT | Status: DC
  Administered 2016-10-12 – 2016-10-13 (×3): 3 mL via RESPIRATORY_TRACT
  Filled 2016-10-12 (×3): qty 3

## 2016-10-12 MED ORDER — INSULIN ASPART 100 UNIT/ML ~~LOC~~ SOLN: 0.0000 [IU] | Freq: Three times a day (TID) | SUBCUTANEOUS | Status: DC

## 2016-10-12 MED ORDER — HYDROCODONE-ACETAMINOPHEN 5-325 MG PO TABS
1.0000 | ORAL_TABLET | ORAL | Status: DC | PRN
  Administered 2016-10-12 (×3): 1 via ORAL
  Filled 2016-10-12 (×4): qty 1

## 2016-10-12 MED ORDER — LORAZEPAM 2 MG/ML IJ SOLN
INTRAMUSCULAR | Status: AC
  Administered 2016-10-12: 0.25 mg via INTRAVENOUS
  Filled 2016-10-12: qty 1

## 2016-10-12 MED ORDER — CARVEDILOL 12.5 MG PO TABS: 12.5000 mg | ORAL_TABLET | Freq: Two times a day (BID) | ORAL | Status: DC

## 2016-10-12 MED ORDER — FUROSEMIDE 10 MG/ML IJ SOLN
40.0000 mg | Freq: Once | INTRAMUSCULAR | Status: AC
  Administered 2016-10-12: 40 mg via INTRAVENOUS

## 2016-10-12 MED ORDER — LORAZEPAM 2 MG/ML IJ SOLN
0.2500 mg | Freq: Once | INTRAMUSCULAR | Status: AC
  Administered 2016-10-12: 0.25 mg via INTRAVENOUS

## 2016-10-12 MED ORDER — LORAZEPAM 0.5 MG PO TABS: 0.2500 mg | ORAL_TABLET | Freq: Once | ORAL | Status: DC

## 2016-10-12 NOTE — Progress Notes (Signed)
Advanced Heart Failure Rounding Note   Subjective:    Was fine early this am and then developed respiratory distress. Placed on BiPAP. Given extra dose of lasix without any benefit. CXR clear.   ABG with respiratory alkalosis   Cath yesterday with normal coronaries. Normal filling pressures PCWP 9. CO preserved. Got onlt 30cc contrast and 200 cc IVF after cath,   Echo EF 20-25%   Objective:   Weight Range:  Vital Signs:   Temp:  [97.6 F (36.4 C)-97.9 F (36.6 C)] 97.6 F (36.4 C) (10/21 0800) Pulse Rate:  [0-87] 78 (10/21 1244) Resp:  [0-22] 22 (10/21 1209) BP: (92-117)/(65-89) 117/89 (10/21 1235) SpO2:  [0 %-100 %] 100 % (10/21 1244) FiO2 (%):  [40 %] 40 % (10/21 1244) Weight:  [83 kg (183 lb)] 83 kg (183 lb) (10/21 0715) Last BM Date: 10/11/16  Weight change: Filed Weights   10/11/16 0506 10/11/16 1126 10/12/16 0715  Weight: 84.1 kg (185 lb 8 oz) 84.1 kg (185 lb 6.5 oz) 83 kg (183 lb)    Intake/Output:   Intake/Output Summary (Last 24 hours) at 10/12/16 1349 Last data filed at 10/12/16 1057  Gross per 24 hour  Intake              246 ml  Output             1450 ml  Net            -1204 ml     Physical Exam: General:  Sitting on bedside commode. On BIPAP HEENT: normal x for mild cushingoid appearance Neck: supple. JVP flat. Carotids 2+ bilat; no bruits. No lymphadenopathy or thryomegaly appreciated. Cor: PMI laterally displaced. Regular rate & rhythm. No rubs, gallops or murmurs. Lungs: clear Abdomen: soft, nontender, nondistended. No hepatosplenomegaly. No bruits or masses. Good bowel sounds. Extremities: no cyanosis, clubbing, rash, trace edema Neuro: alert & orientedx3, cranial nerves grossly intact. moves all 4 extremities w/o difficulty. Affect pleasant  Telemetry: NSR  Labs: Basic Metabolic Panel:  Recent Labs Lab 10/09/16 0934 10/10/16 0221 10/11/16 0245 10/11/16 1608 10/12/16 0250  NA 140 139 138  --  138  K 3.4* 4.0 3.9  --  4.4    CL 108 104 100*  --  101  CO2 24 26 28   --  28  GLUCOSE 83 187* 123*  --  102*  BUN 13 15 20   --  24*  CREATININE 0.95 1.14* 1.02* 0.92 1.45*  CALCIUM 9.0 9.0 9.4  --  9.3    Liver Function Tests: No results for input(s): AST, ALT, ALKPHOS, BILITOT, PROT, ALBUMIN in the last 168 hours. No results for input(s): LIPASE, AMYLASE in the last 168 hours. No results for input(s): AMMONIA in the last 168 hours.  CBC:  Recent Labs Lab 10/09/16 0934 10/10/16 0221 10/11/16 1608 10/12/16 0250  WBC 6.5 8.5 11.3*  11.1* 12.7*  HGB 11.8* 12.8 14.3  14.3 13.7  HCT 36.9 39.5 44.2  44.2 42.9  MCV 87.0 87.8 88.6  88.4 89.0  PLT 220 221 269  273 246    Cardiac Enzymes:  Recent Labs Lab 10/09/16 1501 10/09/16 2039 10/10/16 0221  TROPONINI <0.03 <0.03 <0.03    BNP: BNP (last 3 results)  Recent Labs  07/03/16 1300 10/01/16 1829 10/09/16 1021  BNP 129.5* 364.6* 442.3*    ProBNP (last 3 results) No results for input(s): PROBNP in the last 8760 hours.    Other results:  Imaging: Dg Chest [facility]  1 View  Result Date: 10/12/2016 CLINICAL DATA:  Short of breath today. History of hypertension, diabetes and congestive heart failure. EXAM: PORTABLE CHEST 1 VIEW COMPARISON:  10/09/2016 FINDINGS: Stable cardiomegaly. No mediastinal or hilar masses or evidence of adenopathy. Clear lungs.  No pleural effusion or pneumothorax. Skeletal structures are intact. IMPRESSION: 1. No acute cardiopulmonary disease. No evidence of pulmonary edema. 2. Mild stable cardiomegaly. Electronically Signed   By: Lajean Manes M.D.   On: 10/12/2016 13:11     Medications:     Scheduled Medications: . aspirin  162 mg Oral q morning - 10a  . carvedilol  12.5 mg Oral BID WC  . enoxaparin (LOVENOX) injection  40 mg Subcutaneous Q24H  . fluticasone furoate-vilanterol  1 puff Inhalation Daily  . folic acid  1 mg Oral q morning - 10a  . furosemide  40 mg Intravenous Once  . hydroxychloroquine  200 mg  Oral BID  . insulin aspart  0-6 Units Subcutaneous TID WC  . ipratropium-albuterol  3 mL Nebulization BID  . methotrexate  20 mg Oral Q Fri  . predniSONE  20 mg Oral Q breakfast  . sodium chloride flush  3 mL Intravenous Q12H  . sodium chloride flush  3 mL Intravenous Q12H  . sodium chloride flush  3 mL Intravenous Q12H    Infusions:    PRN Medications: sodium chloride, sodium chloride, acetaminophen **OR** acetaminophen, albuterol, guaiFENesin, hydrALAZINE, HYDROcodone-acetaminophen, ondansetron **OR** ondansetron (ZOFRAN) IV, sodium chloride, sodium chloride flush, sodium chloride flush, traMADol, traZODone   Assessment:   1. A/C Systolic Heart Failure - NICM  Cath 10/11/18 with normal cors PCWP 9    --Cardiac MR in 02/2015 showed EF 46%. No LGE.  2D ECHO 12/2015 showed mild LVH, EF 35-40%, diff HK, normal diastolic function, mild MR, trivial pericardial effusion.Her last ischemic evaluation was in 08/2014 and was a low-risk NST.   --Echo 10/20 EF 20-25% 2. Acute respiratory failure 3. Lupus 4. HTN 5. Asthma 6. Fibromyalgia 7. H/O Cervical Cancer 7. AKI    Plan/Discussion:     Based on exam, RHC yesterday, today's CXR and ABG, I suspect episode this am related to panic attack and not HF. Volume status looks down. Will hold diuretics.   With increased creatinine hold diuretics, Entresto, spiro and will cut carvedilol in half.   May need scheduled benzos. I asked that family not add to her anxiety.   With drop in EF can consider repeat cMRI but had severe reaction to Gadolinium with last test in 3/16 so may not be possible. Also need to watch rising creatinine.   Will need CPX as outpatient to more full assess severity of HF.   D/W Dr. Ree Kida.   Length of Stay: 2 Glori Bickers MD 10/12/2016, 1:49 PM  Advanced Heart Failure Team Pager [phone] (M-F; [place])  Please contact [facility] for night-coverage after hours (4p -7a ) and weekends on [email or web address]

## 2016-10-12 NOTE — Progress Notes (Addendum)
PROGRESS NOTE    Christine Mcdowell  [id number] DOB: 1971/09/20 DOA: 10/09/2016 PCP: Maximino Greenland, MD   Chief Complaint  Patient presents with  . Shortness of Breath    Brief Narrative:  HPI on 10/09/2016 by Dr. Linna Darner [place] Molino is a 45 y.o. female with medical history significant of cervical cancer, CHF, DM, discoid lupus, fibromyalgia, HTN, iron deficiency anemia, migraines, rheumatoid arthritis, sickle cell trait, CVA presenting with a primary complaint of shortness of breath and occasional wheezing. Shortness of breath started around 4 AM. Constant. Getting worse. Associated with lower extremity swelling bilaterally and increasing orthopnea over the last several days. Denies any fevers, chest pain, palpitations, abdominal pain, dysuria, frequency, polydipsia, rash, neck stiffness, headache. Endorses occasional cough with infrequent phlegm production over the last 2-3 weeks which patient felt was likely due to allergies. Patient did not take her Lasix on day of admission but states she is typically compliant with her medical regimen. Symptoms are worsened with ambulation and improved with rest while sitting up. Since her symptoms are similar to her previous CHF exacerbations. 6 pound weight gain. Dry weight 189.  Assessment & Plan   Acute on chronic systolic congestive heart failure -Last echo 12/2015 showed an EF 35-40% -patient states she was recently admitted for the same  -patient has noted weight gain as well as generalized swelling upon admisson -BNP 442.3 -CXR: increased cardiomegaly, pulmonary edema -Cardiology consulted and appreciated -monitor intake/output, daily weights -UOP over past 24hrs 1450cc, weight down 12lbs since admission -States her normal weight is 189lbs  (currently at 183lbs) -Repeat echocardiogram [id number] -Held coreg, Stann Main[place] and spironolactone today due to soft BP and mild AKI -s/p Cath 10/20: normal coronary arteries, echo 20%, normal  right hear pressures -patient may need cMRI  Acute hypoxic respiratory distress -Rapid response called -I was at bedside, O2 sats in the 80s, responded well with NRB and placed on BiPAP for a short period -ABG, CXR, trop, BNP obtained -given dose of Lasix 40mg  IV once -given ativan for anxiety -EKG showed no signs of ischemia -s/p cath 10/20- normal coronaries  Acute kidney injury -likely secondary to contrast from cath vs meds -hold diuretics and ernesto today -Continue to monitor BMP  Lupus/Rheumatoid arthritis -No evidence of acute flare -Continue methotrexate, prednisone, Plaquenil, prednisone, actemra  Hypertension -BP mildly soft today, holding BP meds -Hold Coreg, diuretics, ernesto  Asthma -No evidence of acute flare the patient has endorsed recent intermittent wheezing. Patient may be developing some pulmonary fibrosis which is causing her worst dyspnea. -Will need outpatient PFT -Continue albuterol prn and Breo -May need outpatient sleep eval  Chronic musculoskeletal pain/fibromyalgia  -Continue mobic, tramadol PRN  Distant history of diabetes -Recent glucose readings all normal. Not on any medicines nor endorsing diet control. -A1c 6.0 -placed on modified ISS  DVT Prophylaxis  Lovenox  Code Status: Full  Family Communication: Daughter at bedside  Disposition Plan: Admitted. Continue to monitor closely.   Consultants Cardiology  Procedures  Echocardiogram Catheterization  Antibiotics   Anti-infectives    Start     Dose/Rate Route Frequency Ordered Stop   10/09/16 2200  hydroxychloroquine (PLAQUENIL) tablet 200 mg     200 mg Oral 2 times daily 10/09/16 1441        Subjective:   [name] seen and examined today.  Feels her breathing is worse and states she is very short of breath with minimal activity.  Complains of right eye visual changes, but states that has resolved.  Feels  some chest pain as well as pain all over.  Denies  abdominal pain, N/V/D/C, dizziness, headache.  Objective:   Vitals:   10/12/16 0100 10/12/16 0715 10/12/16 0800 10/12/16 1209  BP: 108/78 92/68 100/65   Pulse: 79 78 84   Resp: 18 18 18  (!) 22  Temp: 97.9 F (36.6 C) 97.6 F (36.4 C) 97.6 F (36.4 C)   TempSrc: Oral Oral Oral   SpO2: 99% 96% 100% 93%  Weight:  83 kg (183 lb)    Height:        Intake/Output Summary (Last 24 hours) at 10/12/16 1210 Last data filed at 10/12/16 1057  Gross per 24 hour  Intake              246 ml  Output             1450 ml  Net            -1204 ml   Filed Weights   10/11/16 0506 10/11/16 1126 10/12/16 0715  Weight: 84.1 kg (185 lb 8 oz) 84.1 kg (185 lb 6.5 oz) 83 kg (183 lb)    Exam  General: Well developed, well nourished, NAD  HEENT: NCAT, mucous membranes moist.   Cardiovascular: S1 S2 auscultated, no murmurs,RRR  Respiratory: Clear to auscultation bilaterally with equal chest rise  Abdomen: Soft, nontender, nondistended, + bowel sounds  Extremities: warm dry without cyanosis clubbing. +LE edema  Neuro: AAOx3, nonfocal  Skin: Without rashes exudates or nodules  Psych: Anxious   Data Reviewed: I have personally reviewed following labs and imaging studies  CBC:  Recent Labs Lab 10/09/16 0934 10/10/16 0221 10/11/16 1608 10/12/16 0250  WBC 6.5 8.5 11.3*  11.1* 12.7*  HGB 11.8* 12.8 14.3  14.3 13.7  HCT 36.9 39.5 44.2  44.2 42.9  MCV 87.0 87.8 88.6  88.4 89.0  PLT 220 221 269  273 0000000   Basic Metabolic Panel:  Recent Labs Lab 10/09/16 0934 10/10/16 0221 10/11/16 0245 10/11/16 1608 10/12/16 0250  NA 140 139 138  --  138  K 3.4* 4.0 3.9  --  4.4  CL 108 104 100*  --  101  CO2 24 26 28   --  28  GLUCOSE 83 187* 123*  --  102*  BUN 13 15 20   --  24*  CREATININE 0.95 1.14* 1.02* 0.92 1.45*  CALCIUM 9.0 9.0 9.4  --  9.3   GFR: Estimated Creatinine Clearance: 53.2 mL/min (by C-G formula based on SCr of 1.45 mg/dL (H)). Liver Function Tests: No results  for input(s): AST, ALT, ALKPHOS, BILITOT, PROT, ALBUMIN in the last 168 hours. No results for input(s): LIPASE, AMYLASE in the last 168 hours. No results for input(s): AMMONIA in the last 168 hours. Coagulation Profile:  Recent Labs Lab 10/11/16 1608  INR 1.09   Cardiac Enzymes:  Recent Labs Lab 10/09/16 1501 10/09/16 2039 10/10/16 0221  TROPONINI <0.03 <0.03 <0.03   BNP (last 3 results) No results for input(s): PROBNP in the last 8760 hours. HbA1C:  Recent Labs  10/10/16 1429  HGBA1C 6.0*   CBG:  Recent Labs Lab 10/11/16 0722 10/11/16 1148 10/11/16 1643 10/11/16 2106 10/12/16 0559  GLUCAP 99 144* 166* 133* 129*   Lipid Profile: No results for input(s): CHOL, HDL, LDLCALC, TRIG, CHOLHDL, LDLDIRECT in the last 72 hours. Thyroid Function Tests:  Recent Labs  10/09/16 1501  TSH 0.929   Anemia Panel: No results for input(s): VITAMINB12, FOLATE, FERRITIN, TIBC, IRON, RETICCTPCT in the  last 72 hours. Urine analysis:    Component Value Date/Time   COLORURINE YELLOW 07/03/2016 1949   APPEARANCEUR CLEAR 07/03/2016 1949   LABSPEC 1.021 07/03/2016 1949   PHURINE 6.5 07/03/2016 1949   GLUCOSEU NEGATIVE 07/03/2016 1949   HGBUR NEGATIVE 07/03/2016 1949   BILIRUBINUR NEGATIVE 07/03/2016 1949   KETONESUR NEGATIVE 07/03/2016 1949   PROTEINUR NEGATIVE 07/03/2016 1949   UROBILINOGEN 1.0 05/29/2015 0536   NITRITE NEGATIVE 07/03/2016 1949   LEUKOCYTESUR NEGATIVE 07/03/2016 1949   Sepsis Labs: @LABRCNTIP (procalcitonin:4,lacticidven:4)  )No results found for this or any previous visit (from the past 240 hour(s)).    Radiology Studies: No results found.   Scheduled Meds: . aspirin  162 mg Oral q morning - 10a  . carvedilol  25 mg Oral BID WC  . enoxaparin (LOVENOX) injection  40 mg Subcutaneous Q24H  . fluticasone furoate-vilanterol  1 puff Inhalation Daily  . folic acid  1 mg Oral q morning - 10a  . furosemide  80 mg Intravenous BID  . hydroxychloroquine   200 mg Oral BID  . insulin aspart  0-6 Units Subcutaneous TID WC  . methotrexate  20 mg Oral Q Fri  . potassium chloride SA  60 mEq Oral Daily  . predniSONE  20 mg Oral Q breakfast  . sacubitril-valsartan  1 tablet Oral BID  . sodium chloride flush  3 mL Intravenous Q12H  . sodium chloride flush  3 mL Intravenous Q12H  . sodium chloride flush  3 mL Intravenous Q12H  . spironolactone  12.5 mg Oral Daily   Continuous Infusions:     LOS: 2 days   Time Spent in minutes   30 minutes  MIKHAIL, MARYANN D.O. on 10/12/2016 at 12:10 PM  Between 7am to 7pm - Pager - [phone]  After 7pm go to [email or web address] - password [email or web address]  And look for the night coverage person covering for me after hours  [facility] Office  [phone]

## 2016-10-12 NOTE — Progress Notes (Signed)
Patient resting quietly at this time. Family members at bedside. No signs and symptoms of respiratory distress noted. Will continue to monitor frequently.

## 2016-10-12 NOTE — Progress Notes (Signed)
Patient suddenly has respiratory distress, having shortness of breath, chest pain, diaphoretic ,clammy and lethargic. Respiratory therapist, Rapid response, Dr. Ree Kida and charge nurse notified. Family members at bedside.

## 2016-10-12 NOTE — Significant Event (Signed)
Rapid Response Event Note  Overview: Time Called: 1215 Arrival Time: 1220 Event Type: Respiratory  Initial Focused Assessment: Called by RN to assess patient in respiratory distress.  RT was notified and I called an got an update while walked over.  Upon arrival patient was sitting up at the edge, working very hard to breathe, labored and was experiencing tachypnea. Lung sounds were diminished.  Patient stated she was tired and felt lightheaded, patient placed back in bed, EKG obtained. VS, SBP in the 80s and patient was hypoxic, placed on 100% NRB. Patient was still talking, oriented, and able to all extremities. MD was present the entire time.  Interventions: -- Lasix 40mg  -- patient placed on 100% NRB -- ABG drawn, BIPAP ordered -- EKG done.  Plan of Care (if not transferred): -- I was called away to [place].  I did speak with the RN and MD once I returned and I did re-assess the in the afternoon. Patient was comfortably breathing, calm, and stated she felt better, but was tired.  I spoke the RN who said patient improved greatly. I also spoke with MD, patient received ativan and labs were drawn earlier, patient was placed on BIPAP for a short time then weaned off.  Event Summary: Name of Physician Notified: Dr. Ree Kida by primary RN at 1215  Name of Consulting Physician Notified: Dr. Haroldine Laws at 1215  Outcome: Stayed in room and stabalized  Event End Time: [place], [place]

## 2016-10-13 DIAGNOSIS — F419 Anxiety disorder, unspecified: Secondary | ICD-10-CM

## 2016-10-13 DIAGNOSIS — I509 Heart failure, unspecified: Secondary | ICD-10-CM

## 2016-10-13 LAB — BASIC METABOLIC PANEL
Anion gap: 11 (ref 5–15)
BUN: 27 mg/dL — ABNORMAL HIGH (ref 6–20)
CO2: 28 mmol/L (ref 22–32)
Calcium: 9.1 mg/dL (ref 8.9–10.3)
Chloride: 98 mmol/L — ABNORMAL LOW (ref 101–111)
Creatinine, Ser: 1.13 mg/dL — ABNORMAL HIGH (ref 0.44–1.00)
GFR calc Af Amer: >60 mL/min (ref >60)
GFR calc non Af Amer: 57 mL/min — ABNORMAL LOW (ref >60)
Glucose, Bld: 103 mg/dL — ABNORMAL HIGH (ref 65–99)
Potassium: 3.8 mmol/L (ref 3.5–5.1)
Sodium: 137 mmol/L (ref 135–145)

## 2016-10-13 LAB — CBC
HCT: 41 % (ref 36.0–46.0)
Hemoglobin: 13.1 g/dL (ref 12.0–15.0)
MCH: 28.2 pg (ref 26.0–34.0)
MCHC: 32 g/dL (ref 30.0–36.0)
MCV: 88.2 fL (ref 78.0–100.0)
Platelets: 235 10*3/uL (ref 150–400)
RBC: 4.65 MIL/uL (ref 3.87–5.11)
RDW: 13.9 % (ref 11.5–15.5)
WBC: 10.5 10*3/uL (ref 4.0–10.5)

## 2016-10-13 LAB — GLUCOSE, CAPILLARY
Glucose-Capillary: 90 mg/dL (ref 65–99)
Glucose-Capillary: 138 mg/dL — ABNORMAL HIGH (ref 65–99)
Glucose-Capillary: 191 mg/dL — ABNORMAL HIGH (ref 65–99)
Glucose-Capillary: 119 mg/dL — ABNORMAL HIGH (ref 65–99)

## 2016-10-13 MED ORDER — CARVEDILOL 3.125 MG PO TABS
3.1250 mg | ORAL_TABLET | Freq: Two times a day (BID) | ORAL | Status: DC
  Administered 2016-10-14: 3.125 mg via ORAL
  Filled 2016-10-13: qty 1

## 2016-10-13 MED ORDER — FUROSEMIDE 40 MG PO TABS
40.0000 mg | ORAL_TABLET | Freq: Two times a day (BID) | ORAL | Status: DC
  Administered 2016-10-13 – 2016-10-14 (×3): 40 mg via ORAL
  Filled 2016-10-13 (×3): qty 1

## 2016-10-13 MED ORDER — LORAZEPAM 0.5 MG PO TABS
0.5000 mg | ORAL_TABLET | Freq: Four times a day (QID) | ORAL | Status: DC | PRN
  Administered 2016-10-13: 0.5 mg via ORAL
  Filled 2016-10-13: qty 1

## 2016-10-13 MED ORDER — LOSARTAN POTASSIUM 25 MG PO TABS
25.0000 mg | ORAL_TABLET | Freq: Every day | ORAL | Status: DC
Start: 2016-10-13 — End: 2016-10-14
  Administered 2016-10-13 – 2016-10-14 (×2): 25 mg via ORAL
  Filled 2016-10-13 (×2): qty 1

## 2016-10-13 MED ORDER — SPIRONOLACTONE 25 MG PO TABS
12.5000 mg | ORAL_TABLET | Freq: Every day | ORAL | Status: DC
  Administered 2016-10-13 – 2016-10-14 (×2): 12.5 mg via ORAL
  Filled 2016-10-13 (×2): qty 1

## 2016-10-13 MED ORDER — DIGOXIN 125 MCG PO TABS
0.1250 mg | ORAL_TABLET | Freq: Every day | ORAL | Status: DC
  Administered 2016-10-13 – 2016-10-14 (×2): 0.125 mg via ORAL
  Filled 2016-10-13 (×2): qty 1

## 2016-10-13 MED ORDER — CLONAZEPAM 0.5 MG PO TABS
0.5000 mg | ORAL_TABLET | Freq: Three times a day (TID) | ORAL | Status: DC | PRN
  Administered 2016-10-13: 0.5 mg via ORAL
  Filled 2016-10-13: qty 1

## 2016-10-13 NOTE — Progress Notes (Signed)
PROGRESS NOTE    Christine Mcdowell  [id number] DOB: December 05, 1971 DOA: 10/09/2016 PCP: Maximino Greenland, MD   Chief Complaint  Patient presents with  . Shortness of Breath    Brief Narrative:  HPI on 10/09/2016 by Dr. Linna Darner Christine Mcdowell is a 45 y.o. female with medical history significant of cervical cancer, CHF, DM, discoid lupus, fibromyalgia, HTN, iron deficiency anemia, migraines, rheumatoid arthritis, sickle cell trait, CVA presenting with a primary complaint of shortness of breath and occasional wheezing. Shortness of breath started around 4 AM. Constant. Getting worse. Associated with lower extremity swelling bilaterally and increasing orthopnea over the last several days. Denies any fevers, chest pain, palpitations, abdominal pain, dysuria, frequency, polydipsia, rash, neck stiffness, headache. Endorses occasional cough with infrequent phlegm production over the last 2-3 weeks which patient felt was likely due to allergies. Patient did not take her Lasix on day of admission but states she is typically compliant with her medical regimen. Symptoms are worsened with ambulation and improved with rest while sitting up. Since her symptoms are similar to her previous CHF exacerbations. 6 pound weight gain. Dry weight 189.  Assessment & Plan   Acute on chronic systolic congestive heart failure -Last echo 12/2015 showed an EF 35-40% -Echocardiogram EF 20-25 -patient states she was recently admitted for the same  -patient has noted weight gain as well as generalized swelling upon admisson -BNP 442.3 -CXR: increased cardiomegaly, pulmonary edema -Cardiology consulted and appreciated -monitor intake/output, daily weights -UOP over past 24hrs 1450cc, weight down 9lbs since admission -States her normal weight is 189lbs  -s/p Cath 10/20: normal coronary arteries, echo 20%, normal right hear pressures -patient may need cMRI -Will need lasix 40mg  BID (with extra dose for weight  gain), Spironolactone 12.5mg  BID, Coreg 3.125mg  BID, losartan 25mg  daily, digoxin 0.125mg  daily  Acute hypoxic respiratory distress -Rapid response called on 10/21 -Feel patient's shortness of breath is due to anxiety -added on klonopin as needed  Acute kidney injury -likely secondary to contrast from cath vs meds -Improving, creatinine 1.13 today  -Continue to monitor BMP  Lupus/Rheumatoid arthritis -No evidence of acute flare -Continue methotrexate, prednisone, Plaquenil, prednisone, actemra  Hypertension -BP mildly soft today, holding BP meds  Asthma -No evidence of acute flare the patient has endorsed recent intermittent wheezing. Patient may be developing some pulmonary fibrosis which is causing her worst dyspnea. -Will need outpatient PFT -Continue albuterol prn and Breo -May need outpatient sleep eval  Chronic musculoskeletal pain/fibromyalgia  -Continue mobic, tramadol PRN  Anxiety -placed on klonopin PRN -Patient continues to have panic attacks.    Distant history of diabetes -Recent glucose readings all normal. Not on any medicines nor endorsing diet control. -A1c 6.0 -placed on modified ISS  DVT Prophylaxis  Lovenox  Code Status: Full  Family Communication: None at bedside  Disposition Plan: Admitted. Continue to monitor closely. Home within 24-48 hours  Consultants Cardiology  Procedures  Echocardiogram Catheterization  Antibiotics   Anti-infectives    Start     Dose/Rate Route Frequency Ordered Stop   10/09/16 2200  hydroxychloroquine (PLAQUENIL) tablet 200 mg     200 mg Oral 2 times daily 10/09/16 1441        Subjective:   Christine Mcdowell seen and examined today.  Was on NRB and felt short of breath. O2 sats reassuring.  Discussed all of patient issues and reassured her.  No longer complains of right eye issues. Denies abdominal pain, N/V/D/C, dizziness, headache.  Objective:   Vitals:   10/13/16  [id number] 10/13/16 0800 10/13/16 0911  10/13/16 0937  BP:  (!) 92/52 96/66   Pulse:  62 91   Resp:  20 20   Temp:  97.6 F (36.4 C)    TempSrc:  Oral    SpO2: 97% 97% 100% 100%  Weight:      Height:        Intake/Output Summary (Last 24 hours) at 10/13/16 1211 Last data filed at 10/13/16 0936  Gross per 24 hour  Intake              123 ml  Output              700 ml  Net             -577 ml   Filed Weights   10/11/16 1126 10/12/16 0715 10/13/16 0400  Weight: 84.1 kg (185 lb 6.5 oz) 83 kg (183 lb) 84.6 kg (186 lb 6.4 oz)    Exam  General: Well developed, well nourished, NAD  HEENT: NCAT, mucous membranes moist.   Cardiovascular: S1 S2 auscultated, no murmurs, RRR  Respiratory: Clear to auscultation bilaterally with equal chest rise  Abdomen: Soft, nontender, nondistended, + bowel sounds  Extremities: warm dry without cyanosis clubbing, or edema  Neuro: AAOx3, nonfocal  Skin: Without rashes exudates or nodules  Psych: Anxious   Data Reviewed: I have personally reviewed following labs and imaging studies  CBC:  Recent Labs Lab 10/09/16 0934 10/10/16 0221 10/11/16 1608 10/12/16 0250 10/13/16 0323  WBC 6.5 8.5 11.3*  11.1* 12.7* 10.5  HGB 11.8* 12.8 14.3  14.3 13.7 13.1  HCT 36.9 39.5 44.2  44.2 42.9 41.0  MCV 87.0 87.8 88.6  88.4 89.0 88.2  PLT 220 221 269  273 246 [id number]   Basic Metabolic Panel:  Recent Labs Lab 10/09/16 0934 10/10/16 0221 10/11/16 0245 10/11/16 1608 10/12/16 0250 10/13/16 0323  NA 140 139 138  --  138 137  K 3.4* 4.0 3.9  --  4.4 3.8  CL 108 104 100*  --  101 98*  CO2 24 26 28   --  28 28  GLUCOSE 83 187* 123*  --  102* 103*  BUN 13 15 20   --  24* 27*  CREATININE 0.95 1.14* 1.02* 0.92 1.45* 1.13*  CALCIUM 9.0 9.0 9.4  --  9.3 9.1   GFR: Estimated Creatinine Clearance: 68.8 mL/min (by C-G formula based on SCr of 1.13 mg/dL (H)). Liver Function Tests: No results for input(s): AST, ALT, ALKPHOS, BILITOT, PROT, ALBUMIN in the last 168 hours. No results for  input(s): LIPASE, AMYLASE in the last 168 hours. No results for input(s): AMMONIA in the last 168 hours. Coagulation Profile:  Recent Labs Lab 10/11/16 1608  INR 1.09   Cardiac Enzymes:  Recent Labs Lab 10/09/16 1501 10/09/16 2039 10/10/16 0221 10/12/16 1304  TROPONINI <0.03 <0.03 <0.03 <0.03   BNP (last 3 results) No results for input(s): PROBNP in the last 8760 hours. HbA1C:  Recent Labs  10/10/16 1429  HGBA1C 6.0*   CBG:  Recent Labs Lab 10/12/16 1237 10/12/16 1556 10/12/16 2056 10/13/16 0613 10/13/16 1155  GLUCAP 149* 173* 109* 90 138*   Lipid Profile: No results for input(s): CHOL, HDL, LDLCALC, TRIG, CHOLHDL, LDLDIRECT in the last 72 hours. Thyroid Function Tests: No results for input(s): TSH, T4TOTAL, FREET4, T3FREE, THYROIDAB in the last 72 hours. Anemia Panel: No results for input(s): VITAMINB12, FOLATE, FERRITIN, TIBC, IRON, RETICCTPCT in the last 72 hours. Urine analysis:  Component Value Date/Time   COLORURINE YELLOW 07/03/2016 1949   APPEARANCEUR CLEAR 07/03/2016 1949   LABSPEC 1.021 07/03/2016 1949   PHURINE 6.5 07/03/2016 1949   GLUCOSEU NEGATIVE 07/03/2016 1949   HGBUR NEGATIVE 07/03/2016 1949   BILIRUBINUR NEGATIVE 07/03/2016 1949   KETONESUR NEGATIVE 07/03/2016 1949   PROTEINUR NEGATIVE 07/03/2016 1949   UROBILINOGEN 1.0 05/29/2015 0536   NITRITE NEGATIVE 07/03/2016 1949   LEUKOCYTESUR NEGATIVE 07/03/2016 1949   Sepsis Labs: @LABRCNTIP (procalcitonin:4,lacticidven:4)  )No results found for this or any previous visit (from the past 240 hour(s)).    Radiology Studies: Dg Chest Port 1 View  Result Date: 10/12/2016 CLINICAL DATA:  Short of breath today. History of hypertension, diabetes and congestive heart failure. EXAM: PORTABLE CHEST 1 VIEW COMPARISON:  10/09/2016 FINDINGS: Stable cardiomegaly. No mediastinal or hilar masses or evidence of adenopathy. Clear lungs.  No pleural effusion or pneumothorax. Skeletal structures are  intact. IMPRESSION: 1. No acute cardiopulmonary disease. No evidence of pulmonary edema. 2. Mild stable cardiomegaly. Electronically Signed   By: Lajean Manes M.D.   On: 10/12/2016 13:11     Scheduled Meds: . carvedilol  3.125 mg Oral BID WC  . digoxin  0.125 mg Oral Daily  . enoxaparin (LOVENOX) injection  40 mg Subcutaneous Q24H  . fluticasone furoate-vilanterol  1 puff Inhalation Daily  . folic acid  1 mg Oral q morning - 10a  . furosemide  40 mg Oral BID  . hydroxychloroquine  200 mg Oral BID  . insulin aspart  0-6 Units Subcutaneous TID WC  . ipratropium-albuterol  3 mL Nebulization BID  . losartan  25 mg Oral Daily  . methotrexate  20 mg Oral Q Fri  . predniSONE  20 mg Oral Q breakfast  . sodium chloride flush  3 mL Intravenous Q12H  . sodium chloride flush  3 mL Intravenous Q12H  . sodium chloride flush  3 mL Intravenous Q12H  . spironolactone  12.5 mg Oral Daily   Continuous Infusions:     LOS: 3 days   Time Spent in minutes   30 minutes  MIKHAIL, MARYANN D.O. on 10/13/2016 at 12:11 PM  Between 7am to 7pm - Pager - [phone]  After 7pm go to [email or web address] - password [email or web address]  And look for the night coverage person covering for me after hours  [facility] Office  [phone]

## 2016-10-13 NOTE — Progress Notes (Addendum)
Advanced Heart Failure Rounding Note   Subjective:    When I walked in the room this am she was c/o SOB. On NRB.   I took mask off and placed on RA and sats still 100%. Weight down 9 pounds since admission.    Echo EF 20-25%   Objective:   Weight Range:  Vital Signs:   Temp:  [97.3 F (36.3 C)-97.8 F (36.6 C)] 97.6 F (36.4 C) (10/22 0800) Pulse Rate:  [62-91] 91 (10/22 0911) Resp:  [18-22] 20 (10/22 0911) BP: (88-117)/(52-89) 96/66 (10/22 0911) SpO2:  [93 %-100 %] 100 % (10/22 0937) FiO2 (%):  [40 %] 40 % (10/21 1244) Weight:  [84.6 kg (186 lb 6.4 oz)] 84.6 kg (186 lb 6.4 oz) (10/22 0400) Last BM Date: 10/12/16  Weight change: Filed Weights   10/11/16 1126 10/12/16 0715 10/13/16 0400  Weight: 84.1 kg (185 lb 6.5 oz) 83 kg (183 lb) 84.6 kg (186 lb 6.4 oz)    Intake/Output:   Intake/Output Summary (Last 24 hours) at 10/13/16 1001 Last data filed at 10/13/16 0936  Gross per 24 hour  Intake              126 ml  Output              700 ml  Net             -574 ml     Physical Exam: General:  Sitting in bed on NRB  HEENT: normal x for mild cushingoid appearance Neck: supple. JVP flat. Carotids 2+ bilat; no bruits. No lymphadenopathy or thryomegaly appreciated. Cor: PMI laterally displaced. Regular rate & rhythm. No rubs, gallops or murmurs. Lungs: clear Abdomen: soft, nontender, nondistended. No hepatosplenomegaly. No bruits or masses. Good bowel sounds. Extremities: no cyanosis, clubbing, rash, No edema Neuro: alert & orientedx3, cranial nerves grossly intact. moves all 4 extremities w/o difficulty. Affect pleasant  Telemetry: NSR 80   Labs: Basic Metabolic Panel:  Recent Labs Lab 10/09/16 0934 10/10/16 0221 10/11/16 0245 10/11/16 1608 10/12/16 0250 10/13/16 0323  NA 140 139 138  --  138 137  K 3.4* 4.0 3.9  --  4.4 3.8  CL 108 104 100*  --  101 98*  CO2 24 26 28   --  28 28  GLUCOSE 83 187* 123*  --  102* 103*  BUN 13 15 20   --  24* 27*    CREATININE 0.95 1.14* 1.02* 0.92 1.45* 1.13*  CALCIUM 9.0 9.0 9.4  --  9.3 9.1    Liver Function Tests: No results for input(s): AST, ALT, ALKPHOS, BILITOT, PROT, ALBUMIN in the last 168 hours. No results for input(s): LIPASE, AMYLASE in the last 168 hours. No results for input(s): AMMONIA in the last 168 hours.  CBC:  Recent Labs Lab 10/09/16 0934 10/10/16 0221 10/11/16 1608 10/12/16 0250 10/13/16 0323  WBC 6.5 8.5 11.3*  11.1* 12.7* 10.5  HGB 11.8* 12.8 14.3  14.3 13.7 13.1  HCT 36.9 39.5 44.2  44.2 42.9 41.0  MCV 87.0 87.8 88.6  88.4 89.0 88.2  PLT 220 221 269  273 246 235    Cardiac Enzymes:  Recent Labs Lab 10/09/16 1501 10/09/16 2039 10/10/16 0221 10/12/16 1304  TROPONINI <0.03 <0.03 <0.03 <0.03    BNP: BNP (last 3 results)  Recent Labs  10/01/16 1829 10/09/16 1021 10/12/16 1304  BNP 364.6* 442.3* 75.1    ProBNP (last 3 results) No results for input(s): PROBNP in the last 8760 hours.  Other results:  Imaging: Dg Chest Port 1 View  Result Date: 10/12/2016 CLINICAL DATA:  Short of breath today. History of hypertension, diabetes and congestive heart failure. EXAM: PORTABLE CHEST 1 VIEW COMPARISON:  10/09/2016 FINDINGS: Stable cardiomegaly. No mediastinal or hilar masses or evidence of adenopathy. Clear lungs.  No pleural effusion or pneumothorax. Skeletal structures are intact. IMPRESSION: 1. No acute cardiopulmonary disease. No evidence of pulmonary edema. 2. Mild stable cardiomegaly. Electronically Signed   By: Lajean Manes M.D.   On: 10/12/2016 13:11     Medications:     Scheduled Medications: . aspirin  162 mg Oral q morning - 10a  . carvedilol  12.5 mg Oral BID WC  . enoxaparin (LOVENOX) injection  40 mg Subcutaneous Q24H  . fluticasone furoate-vilanterol  1 puff Inhalation Daily  . folic acid  1 mg Oral q morning - 10a  . hydroxychloroquine  200 mg Oral BID  . insulin aspart  0-6 Units Subcutaneous TID WC  .  ipratropium-albuterol  3 mL Nebulization BID  . methotrexate  20 mg Oral Q Fri  . predniSONE  20 mg Oral Q breakfast  . sodium chloride flush  3 mL Intravenous Q12H  . sodium chloride flush  3 mL Intravenous Q12H  . sodium chloride flush  3 mL Intravenous Q12H    Infusions:    PRN Medications: sodium chloride, sodium chloride, acetaminophen **OR** acetaminophen, albuterol, guaiFENesin, hydrALAZINE, HYDROcodone-acetaminophen, LORazepam, ondansetron **OR** ondansetron (ZOFRAN) IV, sodium chloride, sodium chloride flush, sodium chloride flush, traMADol, traZODone   Assessment:   1. A/C Systolic Heart Failure - NICM  Cath 10/11/18 with normal cors PCWP 9    --Cardiac MR in 02/2015 showed EF 46%. No LGE.  2D ECHO 12/2015 showed mild LVH, EF 35-40%, diff HK, normal diastolic function, mild MR, trivial pericardial effusion.Her last ischemic evaluation was in 08/2014 and was a low-risk NST.   --Echo 10/20 EF 20-25% 2. Acute respiratory failure 3. Lupus 4. HTN 5. Asthma 6. Fibromyalgia 7. H/O Cervical Cancer 7. AKI    Plan/Discussion:    HF well compensated today. She is having panic attacks and I discussed this with her and reassured her that her cath looked good and although her heart is weak she is doing ok at the moment. I offered her the opportunity to stay another day or go home today. Wants to go home today. Will need close f/u in [facility] Clinic. At this point I don't think there is any utility to repeating MRI at this point.   I have tried to discuss her meds with her but she has very little insight into what she was taking. Based on her current BP of all meds, I doubt she was taking what was on her list  Will switch meds to:  Lasix 40 bid with extra 40 as needed for weight gain (stop torsemide) Spiro 12.5 bid  Carvedilol 3.125 bid Losartan 25 daily Digoxin 0.125 daily  Consider Klonopin  Addendum:  Patient now says her family is worried and wants her to stay another day. I  think this is reasonable but I do worry her family's anxiety is also fueling her anxiety.   Total time spent 40 minutes. Over half that time spent discussing above.    Length of Stay: 3 Hewitt Garner MD 10/13/2016, 10:01 AM  Advanced Heart Failure Team Pager [phone] (M-F; [place])  Please contact [facility] for night-coverage after hours (4p -7a ) and weekends on [email or web address]

## 2016-10-14 ENCOUNTER — Encounter (HOSPITAL_COMMUNITY): Payer: Self-pay | Admitting: Internal Medicine

## 2016-10-14 LAB — GLUCOSE, CAPILLARY
Glucose-Capillary: 83 mg/dL (ref 65–99)
Glucose-Capillary: 104 mg/dL — ABNORMAL HIGH (ref 65–99)

## 2016-10-14 LAB — BASIC METABOLIC PANEL
Anion gap: 10 (ref 5–15)
BUN: 22 mg/dL — ABNORMAL HIGH (ref 6–20)
CO2: 30 mmol/L (ref 22–32)
Calcium: 9.3 mg/dL (ref 8.9–10.3)
Chloride: 97 mmol/L — ABNORMAL LOW (ref 101–111)
Creatinine, Ser: 1.05 mg/dL — ABNORMAL HIGH (ref 0.44–1.00)
GFR calc Af Amer: >60 mL/min (ref >60)
GFR calc non Af Amer: >60 mL/min (ref >60)
Glucose, Bld: 91 mg/dL (ref 65–99)
Potassium: 3.6 mmol/L (ref 3.5–5.1)
Sodium: 137 mmol/L (ref 135–145)

## 2016-10-14 MED ORDER — LOSARTAN POTASSIUM 25 MG PO TABS: 25.0000 mg | ORAL_TABLET | Freq: Every day | ORAL | 0 refills | Status: DC

## 2016-10-14 MED ORDER — CARVEDILOL 3.125 MG PO TABS: 3.1250 mg | ORAL_TABLET | Freq: Two times a day (BID) | ORAL | 0 refills | Status: DC

## 2016-10-14 MED ORDER — FUROSEMIDE 40 MG PO TABS: 40.0000 mg | ORAL_TABLET | Freq: Two times a day (BID) | ORAL | 0 refills | Status: DC

## 2016-10-14 MED ORDER — DIGOXIN 125 MCG PO TABS: 0.1250 mg | ORAL_TABLET | Freq: Every day | ORAL | 0 refills | Status: DC

## 2016-10-14 MED ORDER — CLONAZEPAM 0.5 MG PO TABS: 0.5000 mg | ORAL_TABLET | Freq: Three times a day (TID) | ORAL | 0 refills | Status: DC | PRN

## 2016-10-14 MED ORDER — SPIRONOLACTONE 25 MG PO TABS: 12.5000 mg | ORAL_TABLET | Freq: Every day | ORAL | 0 refills | Status: DC

## 2016-10-14 MED ORDER — TOCILIZUMAB 162 MG/0.9ML ~~LOC~~ SOSY
162.0000 mg | PREFILLED_SYRINGE | Freq: Once | SUBCUTANEOUS | Status: AC
  Administered 2016-10-14: 162 mg via SUBCUTANEOUS
  Filled 2016-10-14 (×2): qty 1

## 2016-10-14 MED FILL — Tocilizumab Subcutaneous Soln Prefilled Syringe 162 MG/0.9ML: SUBCUTANEOUS | Qty: 0.9 | Status: AC

## 2016-10-14 NOTE — Discharge Instructions (Signed)

## 2016-10-14 NOTE — Discharge Summary (Signed)
Physician Discharge Summary  Christine Mcdowell [id number] DOB: 1971/04/21 DOA: 10/09/2016  PCP: Maximino Greenland, MD  Admit date: 10/09/2016 Discharge date: 10/14/2016  Time spent: 45 minutes  Recommendations for Outpatient Follow-up:  Patient will be discharged to home.  Patient will need to follow up with primary care provider within one week of discharge, repeat BMP in 2-3 weeks.  Follow up with the heart failure clinic. Patient should continue medications as prescribed.  Patient should follow a heart healthy diet.   Discharge Diagnoses:  Acute on chronic systolic congestive heart failure Acute hypoxic respiratory distress Acute kidney injury Lupus/Rheumatoid arthritis Hypertension Asthma Chronic musculoskeletal pain/fibromyalgia  Anxiety Distant history of diabetes  Discharge Condition: Stable  Diet recommendation: heart healthy  Filed Weights   10/12/16 0715 10/13/16 0400 10/14/16 0412  Weight: 83 kg (183 lb) 84.6 kg (186 lb 6.4 oz) 83.6 kg (184 lb 6.4 oz)    History of present illness:  on 10/09/2016 by Dr. Sharilyn Sites Stricklandis a 45 y.o.femalewith medical history significant of cervical cancer, CHF, DM, discoid lupus, fibromyalgia, HTN, iron deficiency anemia, migraines, rheumatoid arthritis, sickle cell trait, CVA presenting with a primary complaint of shortness of breath and occasional wheezing. Shortness of breath started around 4 AM. Constant. Getting worse. Associated with lower extremity swelling bilaterally and increasing orthopnea over the last several days. Denies any fevers, chest pain, palpitations, abdominal pain, dysuria, frequency, polydipsia, rash, neck stiffness, headache. Endorses occasional cough with infrequent phlegm production over the last 2-3 weeks which patient felt was likely due to allergies. Patient did not take her Lasix on day of admission but states she is typically compliant with her medical regimen. Symptoms are worsened  with ambulation and improved with rest while sitting up. Since her symptoms are similar to her previous CHF exacerbations. 6 pound weight gain. Dry weight 189.  Hospital Course:  Acute on chronic systolic congestive heart failure -Last echo 12/2015 showed an EF 35-40% -Echocardiogram EF 20-25 -patient states she was recently admitted for the same  -patient has noted weight gain as well as generalized swelling upon admisson -BNP 442.3 -CXR: increased cardiomegaly, pulmonary edema -Cardiology consulted and appreciated -monitor intake/output, daily weights -UOP over past 24hrs 1900cc, weight down to 184lbs (-11) -States her normal weight is 189lbs  -s/p Cath 10/20: normal coronary arteries, echo 20%, normal right hear pressures -Discharged with lasix 40mg  BID (with extra dose for weight gain), Spironolactone 12.5mg , Coreg 3.125mg  BID, losartan 25mg  daily, digoxin 0.125mg  daily -Follow up with heart failure clinic  Acute hypoxic respiratory distress -Resolved, O2 sats 96% on room air -Rapid response called on 10/21 -Feel patient's shortness of breath is due to anxiety -added on klonopin as needed  Acute kidney injury -likely secondary to contrast from cath vs meds -Improving, creatinine 1.05 today  -Repeat BMP in 2-3 weeks  Lupus/Rheumatoid arthritis -No evidence of acute flare -Continue methotrexate, prednisone, Plaquenil, prednisone, actemra  Hypertension -Continue medications as mentioned above  Asthma -No evidence of acute flare the patient has endorsed recent intermittent wheezing. Patient may be developing some pulmonary fibrosis which is causing her worst dyspnea. -Will need outpatient PFT -Continue albuterol prn and Breo -May need outpatient sleep eval  Chronic musculoskeletal pain/fibromyalgia  -Continue mobic, tramadol PRN  Anxiety -placed on klonopin PRN -Patient continues to have panic attacks.    Distant history of diabetes -Recent glucose readings  all normal. Not on any medicines nor endorsing diet control. -A1c 6.0 -Continue home regimen at discharge  Consultants Cardiology  Procedures  Echocardiogram Catheterization  Discharge Exam: Vitals:   10/13/16 2026 10/14/16 0412  BP: 105/62 99/64  Pulse: 80 64  Resp: 18 18  Temp: 98.6 F (37 C) 97.5 F (36.4 C)   Exam  General: Well developed, well nourished, NAD  HEENT: NCAT, mucous membranes moist.   Cardiovascular: S1 S2 auscultated, no murmurs, RRR  Respiratory: Clear to auscultation bilaterally with equal chest rise  Abdomen: Soft, nontender, nondistended, + bowel sounds  Extremities: warm dry without cyanosis clubbing, or edema  Neuro: AAOx3, nonfocal  Skin: Without rashes exudates or nodules  Psych: Appropriate mood and affect   Discharge Instructions Discharge Instructions    Discharge instructions    Complete by:  As directed    Patient will be discharged to home.  Patient will need to follow up with primary care provider within one week of discharge, repeat BMP in 2-3 weeks.  Follow up with the heart failure clinic. Patient should continue medications as prescribed.  Patient should follow a heart healthy diet.     Current Discharge Medication List    START taking these medications   Details  clonazePAM (KLONOPIN) 0.5 MG tablet Take 1 tablet (0.5 mg total) by mouth 3 (three) times daily as needed (Anxiety). Qty: 45 tablet, Refills: 0    digoxin (LANOXIN) 0.125 MG tablet Take 1 tablet (0.125 mg total) by mouth daily. Qty: 30 tablet, Refills: 0    losartan (COZAAR) 25 MG tablet Take 1 tablet (25 mg total) by mouth daily. Qty: 30 tablet, Refills: 0    spironolactone (ALDACTONE) 25 MG tablet Take 0.5 tablets (12.5 mg total) by mouth daily. Qty: 30 tablet, Refills: 0      CONTINUE these medications which have CHANGED   Details  carvedilol (COREG) 3.125 MG tablet Take 1 tablet (3.125 mg total) by mouth 2 (two) times daily with a meal. Qty: 30  tablet, Refills: 0    furosemide (LASIX) 40 MG tablet Take 1 tablet (40 mg total) by mouth 2 (two) times daily. Qty: 60 tablet, Refills: 0      CONTINUE these medications which have NOT CHANGED   Details  ACTEMRA 162 MG/0.9ML SOSY Inject 162 mg into the skin every 14 (fourteen) days. Thursday night around midnight (0000)    albuterol (ACCUNEB) 1.25 MG/3ML nebulizer solution Take 3 mLs (1.25 mg total) by nebulization every 6 (six) hours as needed for wheezing. Qty: 75 mL, Refills: 12    albuterol (PROVENTIL HFA;VENTOLIN HFA) 108 (90 Base) MCG/ACT inhaler Inhale 2 puffs into the lungs daily as needed for shortness of breath.    aspirin 81 MG chewable tablet Chew 162 mg by mouth every morning.     BREO ELLIPTA 100-25 MCG/INH AEPB Inhale 1 puff into the lungs daily. Refills: 2    diphenhydrAMINE (BENADRYL) 25 MG tablet Take 75 mg by mouth every morning.    folic acid (FOLVITE) 1 MG tablet Take 1 tablet (1 mg total) by mouth every morning. Qty: 30 tablet, Refills: 6    hydroxychloroquine (PLAQUENIL) 200 MG tablet Take 1 tablet (200 mg total) by mouth every morning. Qty: 30 tablet, Refills: 0    meloxicam (MOBIC) 15 MG tablet Take 1 tablet (15 mg total) by mouth daily as needed. Inflammation or pain Qty: 30 tablet, Refills: 0    methotrexate (RHEUMATREX) 2.5 MG tablet Take 20 mg by mouth every Friday.  Refills: 3    Multiple Vitamin (MULTIVITAMIN WITH MINERALS) TABS Take 1 tablet by mouth every morning.  naproxen sodium (ANAPROX) 220 MG tablet Take 440 mg by mouth as needed (for pain).    ondansetron (ZOFRAN) 4 MG tablet Take 1 tablet (4 mg total) by mouth every 8 (eight) hours as needed for nausea or vomiting. Qty: 20 tablet, Refills: 0    potassium chloride SA (K-DUR,KLOR-CON) 20 MEQ tablet Take 3 tablets (60 mEq total) by mouth daily. Qty: 270 tablet, Refills: 1    predniSONE (DELTASONE) 20 MG tablet Take 1 tablet (20 mg total) by mouth daily with breakfast. Qty: 30  tablet, Refills: 0    traMADol (ULTRAM) 50 MG tablet Take 50 mg by mouth every 6 (six) hours as needed for pain. Refills: 0    traZODone (DESYREL) 50 MG tablet Take 50 mg by mouth at bedtime as needed for sleep.  Refills: 5      STOP taking these medications     torsemide (DEMADEX) 10 MG tablet      valsartan (DIOVAN) 160 MG tablet        Allergies  Allergen Reactions  . Hydromorphone Nausea And Vomiting    Other reaction(s): GI Upset (intolerance), Hypertension (intolerance) Raises blood pressure   . Iodinated Diagnostic Agents Other (See Comments)    Flu like symptoms, lupus flare During MRI (Gad)  . Erythromycin Nausea And Vomiting  . Latex Hives  . Other Other (See Comments)    Skin Prep "makes my skin peel off" Paper tape causes skin burns   Follow-up Information    Darrick Grinder, NP Follow up on 10/28/2016.   Specialty:  Cardiology Why:  Heart Failure Clinic located on the 1st floor at [facility] at [place] information: [address] [address] [phone]        Maximino Greenland, MD. Schedule an appointment as soon as possible for a visit in 1 week(s).   Specialty:  Internal Medicine Why:  Hospital follow up Contact information: [address] [place] [address] [phone]            The results of significant diagnostics from this hospitalization (including imaging, microbiology, ancillary and laboratory) are listed below for reference.    Significant Diagnostic Studies: Dg Chest 2 View  Result Date: 10/09/2016 CLINICAL DATA:  Cough and shortness of breath. EXAM: CHEST  2 VIEW COMPARISON:  10/01/2016 FINDINGS: There is increased cardiomegaly with new slight bilateral interstitial pulmonary edema. Pulmonary vascularity is normal. No discrete effusions. Bones are normal. IMPRESSION: Increased cardiomegaly.  Slight bilateral new pulmonary edema. Electronically Signed   By: Lorriane Shire M.D.   On:  10/09/2016 10:12   Dg Chest 2 View  Result Date: 10/01/2016 CLINICAL DATA:  Chest pain and dizziness since yesterday. Subjective fluid retention recently. History of CHF, asthma, pneumonia, previous CVA. EXAM: CHEST  2 VIEW COMPARISON:  PA and lateral chest x-ray of July 03, 2016 FINDINGS: The lungs are adequately inflated. There is no focal infiltrate. The cardiac silhouette is mildly enlarged. The pulmonary vascularity is prominent centrally but stable. The mediastinum is normal in width. There is a small left pleural effusion. The observed bony thorax is unremarkable. The gas pattern in the upper abdomen is normal. IMPRESSION: Mild cardiomegaly. Small left pleural effusion. Central pulmonary vascular prominence without pulmonary edema. No evidence of pneumonia. Electronically Signed   By: David  Martinique M.D.   On: 10/01/2016 11:51   Dg Chest Port 1 View  Result Date: 10/12/2016 CLINICAL DATA:  Short of breath today. History of hypertension, diabetes  and congestive heart failure. EXAM: PORTABLE CHEST 1 VIEW COMPARISON:  10/09/2016 FINDINGS: Stable cardiomegaly. No mediastinal or hilar masses or evidence of adenopathy. Clear lungs.  No pleural effusion or pneumothorax. Skeletal structures are intact. IMPRESSION: 1. No acute cardiopulmonary disease. No evidence of pulmonary edema. 2. Mild stable cardiomegaly. Electronically Signed   By: Lajean Manes M.D.   On: 10/12/2016 13:11    Microbiology: No results found for this or any previous visit (from the past 240 hour(s)).   Labs: Basic Metabolic Panel:  Recent Labs Lab 10/10/16 0221 10/11/16 0245 10/11/16 1608 10/12/16 0250 10/13/16 0323 10/14/16 0357  NA 139 138  --  138 137 137  K 4.0 3.9  --  4.4 3.8 3.6  CL 104 100*  --  101 98* 97*  CO2 26 28  --  28 28 30   GLUCOSE 187* 123*  --  102* 103* 91  BUN 15 20  --  24* 27* 22*  CREATININE 1.14* 1.02* 0.92 1.45* 1.13* 1.05*  CALCIUM 9.0 9.4  --  9.3 9.1 9.3   Liver Function Tests: No  results for input(s): AST, ALT, ALKPHOS, BILITOT, PROT, ALBUMIN in the last 168 hours. No results for input(s): LIPASE, AMYLASE in the last 168 hours. No results for input(s): AMMONIA in the last 168 hours. CBC:  Recent Labs Lab 10/09/16 0934 10/10/16 0221 10/11/16 1608 10/12/16 0250 10/13/16 0323  WBC 6.5 8.5 11.3*  11.1* 12.7* 10.5  HGB 11.8* 12.8 14.3  14.3 13.7 13.1  HCT 36.9 39.5 44.2  44.2 42.9 41.0  MCV 87.0 87.8 88.6  88.4 89.0 88.2  PLT 220 221 269  273 246 235   Cardiac Enzymes:  Recent Labs Lab 10/09/16 1501 10/09/16 2039 10/10/16 0221 10/12/16 1304  TROPONINI <0.03 <0.03 <0.03 <0.03   BNP: BNP (last 3 results)  Recent Labs  10/01/16 1829 10/09/16 1021 10/12/16 1304  BNP 364.6* 442.3* 75.1    ProBNP (last 3 results) No results for input(s): PROBNP in the last 8760 hours.  CBG:  Recent Labs Lab 10/13/16 0613 10/13/16 1155 10/13/16 1544 10/13/16 2121 10/14/16 0552  GLUCAP 90 138* 191* 119* 83       Signed:  MIKHAIL, MARYANN  [facility] 10/14/2016, 10:21 AM

## 2016-10-14 NOTE — Progress Notes (Signed)
Advanced Heart Failure Rounding Note   Subjective:   Feeling better. Denies SOB.    10/11/2016 Echo EF 20-25%   Objective:   Weight Range:  Vital Signs:   Temp:  [97.5 F (36.4 C)-98.6 F (37 C)] 97.5 F (36.4 C) (10/23 0412) Pulse Rate:  [52-101] 64 (10/23 0412) Resp:  [18-20] 18 (10/23 0412) BP: (90-105)/(54-67) 99/64 (10/23 0412) SpO2:  [78 %-100 %] 96 % (10/23 0412) Weight:  [184 lb 6.4 oz (83.6 kg)] 184 lb 6.4 oz (83.6 kg) (10/23 0412) Last BM Date: 10/13/16  Weight change: Filed Weights   10/12/16 0715 10/13/16 0400 10/14/16 0412  Weight: 183 lb (83 kg) 186 lb 6.4 oz (84.6 kg) 184 lb 6.4 oz (83.6 kg)    Intake/Output:   Intake/Output Summary (Last 24 hours) at 10/14/16 0801 Last data filed at 10/14/16 0658  Gross per 24 hour  Intake              963 ml  Output             1900 ml  Net             -937 ml     Physical Exam: General:  In bed . NAD.  HEENT: normal x for mild cushingoid appearance Neck: supple. JVP flat. Carotids 2+ bilat; no bruits. No lymphadenopathy or thryomegaly appreciated. Cor: PMI laterally displaced. Regular rate & rhythm. No rubs, gallops or murmurs. Lungs: clear on room air Abdomen: soft, nontender, nondistended. No hepatosplenomegaly. No bruits or masses. Good bowel sounds. Extremities: no cyanosis, clubbing, rash, No edema Neuro: alert & orientedx3, cranial nerves grossly intact. moves all 4 extremities w/o difficulty. Affect pleasant  Telemetry: NSR 80s   Labs: Basic Metabolic Panel:  Recent Labs Lab 10/10/16 0221 10/11/16 0245 10/11/16 1608 10/12/16 0250 10/13/16 0323 10/14/16 0357  NA 139 138  --  138 137 137  K 4.0 3.9  --  4.4 3.8 3.6  CL 104 100*  --  101 98* 97*  CO2 26 28  --  28 28 30   GLUCOSE 187* 123*  --  102* 103* 91  BUN 15 20  --  24* 27* 22*  CREATININE 1.14* 1.02* 0.92 1.45* 1.13* 1.05*  CALCIUM 9.0 9.4  --  9.3 9.1 9.3    Liver Function Tests: No results for input(s): AST, ALT, ALKPHOS,  BILITOT, PROT, ALBUMIN in the last 168 hours. No results for input(s): LIPASE, AMYLASE in the last 168 hours. No results for input(s): AMMONIA in the last 168 hours.  CBC:  Recent Labs Lab 10/09/16 0934 10/10/16 0221 10/11/16 1608 10/12/16 0250 10/13/16 0323  WBC 6.5 8.5 11.3*  11.1* 12.7* 10.5  HGB 11.8* 12.8 14.3  14.3 13.7 13.1  HCT 36.9 39.5 44.2  44.2 42.9 41.0  MCV 87.0 87.8 88.6  88.4 89.0 88.2  PLT 220 221 269  273 246 235    Cardiac Enzymes:  Recent Labs Lab 10/09/16 1501 10/09/16 2039 10/10/16 0221 10/12/16 1304  TROPONINI <0.03 <0.03 <0.03 <0.03    BNP: BNP (last 3 results)  Recent Labs  10/01/16 1829 10/09/16 1021 10/12/16 1304  BNP 364.6* 442.3* 75.1    ProBNP (last 3 results) No results for input(s): PROBNP in the last 8760 hours.    Other results:  Imaging: Dg Chest Port 1 View  Result Date: 10/12/2016 CLINICAL DATA:  Short of breath today. History of hypertension, diabetes and congestive heart failure. EXAM: PORTABLE CHEST 1 VIEW COMPARISON:  10/09/2016  FINDINGS: Stable cardiomegaly. No mediastinal or hilar masses or evidence of adenopathy. Clear lungs.  No pleural effusion or pneumothorax. Skeletal structures are intact. IMPRESSION: 1. No acute cardiopulmonary disease. No evidence of pulmonary edema. 2. Mild stable cardiomegaly. Electronically Signed   By: Lajean Manes M.D.   On: 10/12/2016 13:11     Medications:     Scheduled Medications: . carvedilol  3.125 mg Oral BID WC  . digoxin  0.125 mg Oral Daily  . enoxaparin (LOVENOX) injection  40 mg Subcutaneous Q24H  . fluticasone furoate-vilanterol  1 puff Inhalation Daily  . folic acid  1 mg Oral q morning - 10a  . furosemide  40 mg Oral BID  . hydroxychloroquine  200 mg Oral BID  . insulin aspart  0-6 Units Subcutaneous TID WC  . losartan  25 mg Oral Daily  . methotrexate  20 mg Oral Q Fri  . predniSONE  20 mg Oral Q breakfast  . sodium chloride flush  3 mL Intravenous  Q12H  . sodium chloride flush  3 mL Intravenous Q12H  . sodium chloride flush  3 mL Intravenous Q12H  . spironolactone  12.5 mg Oral Daily    Infusions:    PRN Medications: sodium chloride, sodium chloride, acetaminophen **OR** acetaminophen, albuterol, clonazePAM, guaiFENesin, hydrALAZINE, HYDROcodone-acetaminophen, ondansetron **OR** ondansetron (ZOFRAN) IV, sodium chloride, sodium chloride flush, sodium chloride flush, traMADol, traZODone   Assessment:   1. A/C Systolic Heart Failure - NICM  Cath 10/11/18 with normal cors PCWP 9    --Cardiac MR in 02/2015 showed EF 46%. No LGE.  2D ECHO 12/2015 showed mild LVH, EF 35-40%, diff HK, normal diastolic function, mild MR, trivial pericardial effusion.Her last ischemic evaluation was in 08/2014 and was a low-risk NST.   --Echo 10/20 EF 20-25% 2. Acute respiratory failure 3. Lupus 4. HTN 5. Asthma 6. Fibromyalgia 7. H/O Cervical Cancer 7. AKI    Plan/Discussion:    HF well compensated today. Holding off on MRI. Volume status stable.  Continue meds for d/c. We will set up follow up.   HF meds for D/C  Lasix 40 bid with extra 40 as needed for weight gain (stop torsemide) Spiro 12.5 bid  Carvedilol 3.125 bid Losartan 25 daily Digoxin 0.125 daily  Consider Klonopin  Length of Stay: [place] NP-C  10/14/2016, 8:01 AM  Advanced Heart Failure Team Pager [phone] (M-F; 7a - 4p)  Please contact [facility] for night-coverage after hours (4p -7a ) and weekends on [email or web address]  Patient seen and examined with Darrick Grinder, NP. We discussed all aspects of the encounter. I agree with the assessment and plan as stated above.   Agree with above. She is ready for d/c today. Current meds reviewed. Will follow closely in HF Clinic.   Bensimhon, Daniel,MD 9:57 AM

## 2016-10-18 ENCOUNTER — Telehealth (HOSPITAL_COMMUNITY): Payer: Self-pay | Admitting: *Deleted

## 2016-10-18 NOTE — Telephone Encounter (Signed)
Patient called in c/o chest pain. She stated she had two sharp chests pain and then immediately felt exhausted. Patient wanted to be seen today in our clinic but instead has decided to go to the emergency room.

## 2016-10-23 ENCOUNTER — Ambulatory Visit: Payer: BLUE CROSS/BLUE SHIELD | Admitting: Physician Assistant

## 2016-10-28 ENCOUNTER — Encounter (HOSPITAL_COMMUNITY): Payer: Self-pay

## 2016-10-28 ENCOUNTER — Ambulatory Visit (HOSPITAL_COMMUNITY)
Admission: RE | Admit: 2016-10-28 | Discharge: 2016-10-28 | Disposition: A | Discharge status: Home / Self Care | Payer: BLUE CROSS/BLUE SHIELD | Source: Ambulatory Visit | Attending: Cardiology | Admitting: Cardiology

## 2016-10-28 VITALS — BP 158/92 | HR 87 | Wt 190.4 lb

## 2016-10-28 DIAGNOSIS — I10 Essential (primary) hypertension: Secondary | ICD-10-CM | POA: Diagnosis not present

## 2016-10-28 DIAGNOSIS — J45909 Unspecified asthma, uncomplicated: Secondary | ICD-10-CM | POA: Diagnosis not present

## 2016-10-28 DIAGNOSIS — M797 Fibromyalgia: Secondary | ICD-10-CM | POA: Diagnosis not present

## 2016-10-28 DIAGNOSIS — I5042 Chronic combined systolic (congestive) and diastolic (congestive) heart failure: Secondary | ICD-10-CM | POA: Diagnosis not present

## 2016-10-28 DIAGNOSIS — M329 Systemic lupus erythematosus, unspecified: Secondary | ICD-10-CM | POA: Diagnosis not present

## 2016-10-28 DIAGNOSIS — I5022 Chronic systolic (congestive) heart failure: Secondary | ICD-10-CM | POA: Diagnosis not present

## 2016-10-28 LAB — BASIC METABOLIC PANEL
Anion gap: 9 (ref 5–15)
BUN: 15 mg/dL (ref 6–20)
CO2: 28 mmol/L (ref 22–32)
Calcium: 8.9 mg/dL (ref 8.9–10.3)
Chloride: 104 mmol/L (ref 101–111)
Creatinine, Ser: 0.98 mg/dL (ref 0.44–1.00)
GFR calc Af Amer: >60 mL/min (ref >60)
GFR calc non Af Amer: >60 mL/min (ref >60)
Glucose, Bld: 86 mg/dL (ref 65–99)
Potassium: 3.5 mmol/L (ref 3.5–5.1)
Sodium: 141 mmol/L (ref 135–145)

## 2016-10-28 LAB — BRAIN NATRIURETIC PEPTIDE: B Natriuretic Peptide: 97.4 pg/mL (ref 0.0–100.0)

## 2016-10-28 MED ORDER — DIGOXIN 125 MCG PO TABS: 0.1250 mg | ORAL_TABLET | Freq: Every day | ORAL | 3 refills | Status: DC

## 2016-10-28 MED ORDER — LOSARTAN POTASSIUM 25 MG PO TABS: 25.0000 mg | ORAL_TABLET | Freq: Every day | ORAL | 3 refills | Status: DC

## 2016-10-28 MED ORDER — POTASSIUM CHLORIDE CRYS ER 20 MEQ PO TBCR: 60.0000 meq | EXTENDED_RELEASE_TABLET | Freq: Every day | ORAL | 3 refills | Status: DC

## 2016-10-28 MED ORDER — SPIRONOLACTONE 25 MG PO TABS: 12.5000 mg | ORAL_TABLET | Freq: Every day | ORAL | 3 refills | Status: DC

## 2016-10-28 NOTE — Patient Instructions (Signed)
Routine lab work today. Will notify you of abnormal results, otherwise no news is good news!  Will schedule you for Cardiopulmonary Exercise Test. This test is done at our [facility]. Please wear comfortable clothes and shoes for this test. Avoid heavy meal before the test (light snack/meal recommended). Avoid caffeine, alcohol, tobacco products 12 hrs before test. Please give 24 hr notice for cancellations/rescheduling: [id number].  ___________________________________________________________  ___________________________________________________________  Follow up 2-4 weeks with Amy Clegg NP-C.  Do the following things EVERYDAY: 1) Weigh yourself in the morning before breakfast. Write it down and keep it in a log. 2) Take your medicines as prescribed 3) Eat low salt foods-Limit salt (sodium) to 2000 mg per day.  4) Stay as active as you can everyday 5) Limit all fluids for the day to less than 2 liters

## 2016-10-28 NOTE — Progress Notes (Signed)
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   Cardiac MR in 02/2015 showed EF 46%. No LGE.  2D ECHO 12/2015 showed mild LVH, EF 35-40%, diff HK, normal diastolic function, mild MR, trivial pericardial effusion.Her last ischemic evaluation was in 08/2014 and was a low-risk NST.  Admitted in October with increased dyspnea and volume overload. Diuresed with IV lasix and transitioned to lasix 40 mg twice a day. LHC/RHC normal filling pressures, EF ~20%, and normal coronaries. Discharge weight 184 pounds.   Today she returns for HF follow up. Complaining of fatigue. She says she is not sure if fatigue is related to lupus.; SOB with exertion. + Orthopnea sleeps on 4 pillows. Weight at home 187-188 pounds. Walks slowly around the grocery store. Appetite ok. Following low salt diet and limiting fluid intake. Did not take losartan this morning due to SBP at home 134. Says PCP told her to hold losartan when SBP < 140. Working full time as the Armed forces operational officer" .  RHC/LHC 10/11/2016  RA = 4 RV = 24/5 PA = 23/4 (16) PCW = 9 Fick cardiac output/index = 4.4/2.2 SVR = 1580 PVR =  1.5 WU Ao sat = 95% PA sat = 58%, 62% Assessment: 1. Normal coronary arteries with separate ostial for LAD and LCX 2. Normal right heart pressures 3. NICM with EF ~20% by echo   ROS: All systems negative except as listed in HPI, PMH and Problem List.  SH:  Social History   Social History  . Marital status: Divorced    Spouse name: N/A  . Number of children: N/A  . Years of education: N/A   Occupational History  . Not on file.   Social History Main Topics  . Smoking status: Never Smoker  . Smokeless tobacco: Never Used  . Alcohol use  No  . Drug use: No  . Sexual activity: Not Currently   Other Topics Concern  . Not on file   Social History Narrative  . No narrative on file    FH:  Family History  Problem Relation Age of Onset  . Heart attack Father   . Diabetes Other   . Hypertension Other   . Heart attack Paternal Grandmother     Past Medical History:  Diagnosis Date  . Anemia   . Anginal pain ([place])   . Asthma   . Cervical cancer ([place])   . CHF (congestive heart failure) ([place])   . Diabetes mellitus without complication ([place])    steroid induced  . Discoid lupus   . Fibromyalgia   . History of blood transfusion "several"   "related to anemia; had some w/hysterectomy also"  . Hx of cardiovascular stress test    ETT-Myoview (9/15):  No ischemia, EF 52%; NORMAL  . Hx of echocardiogram    Echo (9/15):  EF 50-55%, ant HK, Gr 1 DD, mild MR, mild LAE, no effusion  . Hypertension   . Iron deficiency anemia    h/o iron transfusions  . Lupus (systemic lupus erythematosus) ([place])   . Migraine    "a few/year" (07/03/2016)  . Pneumonia 12/2015  . RA (rheumatoid arthritis) ([place])    "all over" (07/03/2016)  . Sickle cell trait ([place])   . Stroke [facility] 2014  X 1; 2015 X 2; 2016 X 1;    "right side of face more relaxed than the other; rare speech hesitation" (07/03/2016)    Current Outpatient Prescriptions  Medication Sig Dispense Refill  . ACTEMRA 162 MG/0.9ML SOSY Inject 162 mg into the skin every 14 (fourteen) days. Thursday night around midnight (0000)    . albuterol (ACCUNEB) 1.25 MG/3ML nebulizer solution Take 3 mLs (1.25 mg total) by nebulization every 6 (six) hours as needed for wheezing. 75 mL 12  . albuterol (PROVENTIL HFA;VENTOLIN HFA) 108 (90 Base) MCG/ACT inhaler Inhale 2 puffs into the lungs daily as needed for shortness of breath.    Marland Kitchen aspirin 81 MG chewable tablet Chew 162 mg by mouth every morning.     Marland Kitchen BREO ELLIPTA 100-25 MCG/INH AEPB Inhale 1 puff into the lungs daily.  2  . carvedilol (COREG)  3.125 MG tablet Take 1 tablet (3.125 mg total) by mouth 2 (two) times daily with a meal. 30 tablet 0  . clonazePAM (KLONOPIN) 0.5 MG tablet Take 1 tablet (0.5 mg total) by mouth 3 (three) times daily as needed (Anxiety). 45 tablet 0  . digoxin (LANOXIN) 0.125 MG tablet Take 1 tablet (0.125 mg total) by mouth daily. 30 tablet 0  . diphenhydrAMINE (BENADRYL) 25 MG tablet Take 75 mg by mouth every morning.    . folic acid (FOLVITE) 1 MG tablet Take 1 tablet (1 mg total) by mouth every morning. 30 tablet 6  . furosemide (LASIX) 40 MG tablet Take 1 tablet (40 mg total) by mouth 2 (two) times daily. 60 tablet 0  . hydroxychloroquine (PLAQUENIL) 200 MG tablet Take 200 mg by mouth 2 (two) times daily.    Marland Kitchen losartan (COZAAR) 25 MG tablet Take 1 tablet (25 mg total) by mouth daily. 30 tablet 0  . meloxicam (MOBIC) 15 MG tablet Take 15 mg by mouth 2 (two) times daily.    . methotrexate (RHEUMATREX) 2.5 MG tablet Take 20 mg by mouth once a week. Thursdays  3  . Multiple Vitamin (MULTIVITAMIN WITH MINERALS) TABS Take 1 tablet by mouth every morning.     . ondansetron (ZOFRAN) 4 MG tablet Take 1 tablet (4 mg total) by mouth every 8 (eight) hours as needed for nausea or vomiting. 20 tablet 0  . potassium chloride SA (K-DUR,KLOR-CON) 20 MEQ tablet Take 3 tablets (60 mEq total) by mouth daily. 270 tablet 1  . predniSONE (DELTASONE) 20 MG tablet Take 1 tablet (20 mg total) by mouth daily with breakfast. 30 tablet 0  . spironolactone (ALDACTONE) 25 MG tablet Take 0.5 tablets (12.5 mg total) by mouth daily. 30 tablet 0   No current facility-administered medications for this encounter.     Vitals:   10/28/16 1044  BP: (!) 158/92  Pulse: 87  SpO2: 98%  Weight: 190 lb 6.4 oz (86.4 kg)    PHYSICAL EXAM:  General:  Well appearing. No resp difficulty HEENT: normal Neck: supple. JVP 5-6. Carotids 2+ bilaterally; no bruits. No lymphadenopathy or thryomegaly appreciated. Cor: PMI normal. Regular rate & rhythm. No  rubs, gallops or murmurs. Lungs: clear Abdomen: soft, nontender, nondistended. No hepatosplenomegaly. No bruits or masses. Good bowel sounds. Extremities: no cyanosis, clubbing, rash, R and LLE trace edema Neuro: alert & orientedx3, cranial nerves grossly intact. Moves all 4 extremities w/o difficulty. Affect pleasant.   ASSESSMENT & PLAN: 1. Chronic Systolic Heart Failure - NICM Cath 10/11/18 with normal cors PCWP 9    --Cardiac MRI in 02/2015 showed EF  46%. No LGE. 2D ECHO 12/2015 showed mild LVH, EF 35-40%, diff HK, normal diastolic function, mild MR, trivial pericardial effusion.Her last ischemic evaluation was in 08/2014 and was a low-risk NST.   --Echo 10/11/2016  EF 20-25%. Plan to repeat ECHO after HF meds optimized.  NYHA IIIb. Volume status stable. Continue lasix 40 mg twice a day . Spiro 12.5 mg daily.  - Continue Carvedilol 3.125 mg bid. May need to stop due to fatigue but will continue for now.  - Encouraged to take Losartan 25 daily despite SBP 130s  Digoxin 0.125 daily - Next visit will consider adding bidil but will hold off as she has not been taking losartan twice a day. 2.  Lupus- Per Dr Eliberto Ivory.  3. HTN- Elevated today but missed doses of losartan. I have asked her to take medications as ordered even if SBP 130 and that we would like to to keep SBP <130.  5. Asthma 6. Fibromyalgia 7. H/O Cervical Cancer 7. H/O AKI- BMET today  Set up CPX. Check BMET, BNP today. Follow up in 2 weeks.   Amy Clegg NP-C  11:24 AM

## 2016-11-18 ENCOUNTER — Other Ambulatory Visit (HOSPITAL_COMMUNITY): Payer: Self-pay | Admitting: *Deleted

## 2016-11-18 MED ORDER — CARVEDILOL 3.125 MG PO TABS
3.1250 mg | ORAL_TABLET | Freq: Two times a day (BID) | ORAL | 3 refills | Status: DC
Start: 2016-11-18 — End: 2017-01-28

## 2016-11-19 ENCOUNTER — Encounter (HOSPITAL_COMMUNITY): Payer: Self-pay | Admitting: Cardiology

## 2016-11-19 ENCOUNTER — Ambulatory Visit (HOSPITAL_COMMUNITY): Payer: BLUE CROSS/BLUE SHIELD | Attending: Cardiology

## 2016-11-19 DIAGNOSIS — I509 Heart failure, unspecified: Secondary | ICD-10-CM | POA: Diagnosis not present

## 2016-11-20 ENCOUNTER — Other Ambulatory Visit (HOSPITAL_COMMUNITY): Payer: Self-pay | Admitting: *Deleted

## 2016-11-20 DIAGNOSIS — I5022 Chronic systolic (congestive) heart failure: Secondary | ICD-10-CM

## 2016-11-25 ENCOUNTER — Encounter (HOSPITAL_COMMUNITY): Payer: Self-pay | Admitting: Cardiology

## 2016-11-25 ENCOUNTER — Ambulatory Visit (HOSPITAL_COMMUNITY)
Admission: RE | Admit: 2016-11-25 | Discharge: 2016-11-25 | Disposition: A | Discharge status: Home / Self Care | Payer: BLUE CROSS/BLUE SHIELD | Source: Ambulatory Visit | Attending: Internal Medicine | Admitting: Internal Medicine

## 2016-11-25 VITALS — BP 168/102 | HR 98 | Wt 194.6 lb

## 2016-11-25 DIAGNOSIS — I10 Essential (primary) hypertension: Secondary | ICD-10-CM

## 2016-11-25 DIAGNOSIS — Z5189 Encounter for other specified aftercare: Secondary | ICD-10-CM | POA: Insufficient documentation

## 2016-11-25 DIAGNOSIS — M329 Systemic lupus erythematosus, unspecified: Secondary | ICD-10-CM

## 2016-11-25 DIAGNOSIS — I5042 Chronic combined systolic (congestive) and diastolic (congestive) heart failure: Secondary | ICD-10-CM | POA: Diagnosis not present

## 2016-11-25 DIAGNOSIS — Z7982 Long term (current) use of aspirin: Secondary | ICD-10-CM | POA: Diagnosis not present

## 2016-11-25 DIAGNOSIS — M797 Fibromyalgia: Secondary | ICD-10-CM | POA: Diagnosis not present

## 2016-11-25 DIAGNOSIS — I5022 Chronic systolic (congestive) heart failure: Secondary | ICD-10-CM

## 2016-11-25 DIAGNOSIS — J45909 Unspecified asthma, uncomplicated: Secondary | ICD-10-CM | POA: Diagnosis not present

## 2016-11-25 DIAGNOSIS — Z8541 Personal history of malignant neoplasm of cervix uteri: Secondary | ICD-10-CM | POA: Insufficient documentation

## 2016-11-25 DIAGNOSIS — I110 Hypertensive heart disease with heart failure: Secondary | ICD-10-CM | POA: Insufficient documentation

## 2016-11-25 DIAGNOSIS — Z79899 Other long term (current) drug therapy: Secondary | ICD-10-CM | POA: Diagnosis not present

## 2016-11-25 MED ORDER — SACUBITRIL-VALSARTAN 24-26 MG PO TABS: 1.0000 | ORAL_TABLET | Freq: Two times a day (BID) | ORAL | 6 refills | Status: DC

## 2016-11-25 NOTE — Progress Notes (Signed)
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   Cardiac MR in 02/2015 showed EF 46%. No LGE.  2D ECHO 12/2015 showed mild LVH, EF 35-40%, diff HK, normal diastolic function, mild MR, trivial pericardial effusion.Her last ischemic evaluation was in 08/2014 and was a low-risk NST.  Admitted in October with increased dyspnea and volume overload. Diuresed with IV lasix and transitioned to lasix 40 mg twice a day. LHC/RHC normal filling pressures, EF ~20%, and normal coronaries. Discharge weight 184 pounds.   Today she returns for HF follow up. Overall feeling ok. Complaining of fatigue.  Mild dyspnea  With exeriton. Denies PND/Orthopnea. Weight at home trending up from 188 to 194 pounds. SBP at home 150-160  Taking all medications.  Working full time as the Armed forces operational officer"   CPX 10/2016 Peak VO2: 18.5 (82% predicted peak VO2) VE/VCO2 slope: 30 OUES: 1.75 Peak RER: 1.14.  RHC/LHC 10/11/2016  RA = 4 RV = 24/5 PA = 23/4 (16) PCW = 9 Fick cardiac output/index = 4.4/2.2 SVR = 1580 PVR =  1.5 WU Ao sat = 95% PA sat = 58%, 62% Assessment: 1. Normal coronary arteries with separate ostial for LAD and LCX 2. Normal right heart pressures 3. NICM with EF ~20% by echo   ROS: All systems negative except as listed in HPI, PMH and Problem List.  SH:  Social History   Social History  . Marital status: Divorced    Spouse name: N/A  . Number of children: N/A  . Years of education: N/A   Occupational History  . Not on file.   Social History Main Topics  . Smoking status: Never Smoker  . Smokeless tobacco: Never Used  . Alcohol use No  . Drug use: No  . Sexual activity: Not Currently   Other Topics Concern   . Not on file   Social History Narrative  . No narrative on file    FH:  Family History  Problem Relation Age of Onset  . Heart attack Father   . Diabetes Other   . Hypertension Other   . Heart attack Paternal Grandmother     Past Medical History:  Diagnosis Date  . Anemia   . Anginal pain ([place])   . Asthma   . Cervical cancer ([place])   . CHF (congestive heart failure) ([place])   . Diabetes mellitus without complication ([place])    steroid induced  . Discoid lupus   . Fibromyalgia   . History of blood transfusion "several"   "related to anemia; had some w/hysterectomy also"  . Hx of cardiovascular stress test    ETT-Myoview (9/15):  No ischemia, EF 52%; NORMAL  . Hx of echocardiogram    Echo (9/15):  EF 50-55%, ant HK, Gr 1 DD, mild MR, mild LAE, no effusion  . Hypertension   . Iron deficiency anemia    h/o iron transfusions  . Lupus (systemic lupus erythematosus) ([place])   . Migraine    "a few/year" (07/03/2016)  . Pneumonia 12/2015  . RA (rheumatoid arthritis) ([place])    "all over" (07/03/2016)  . Sickle cell trait ([place])   . Stroke ([place]) 2014 X 1; 2015 X 2; 2016 X 1;    "right side of face more  relaxed than the other; rare speech hesitation" (07/03/2016)    Current Outpatient Prescriptions  Medication Sig Dispense Refill  . ACTEMRA 162 MG/0.9ML SOSY Inject 162 mg into the skin every 14 (fourteen) days. Thursday night around midnight (0000)    . albuterol (ACCUNEB) 1.25 MG/3ML nebulizer solution Take 3 mLs (1.25 mg total) by nebulization every 6 (six) hours as needed for wheezing. 75 mL 12  . albuterol (PROVENTIL HFA;VENTOLIN HFA) 108 (90 Base) MCG/ACT inhaler Inhale 2 puffs into the lungs daily as needed for shortness of breath.    Marland Kitchen aspirin 81 MG chewable tablet Chew 162 mg by mouth every morning.     Marland Kitchen BREO ELLIPTA 100-25 MCG/INH AEPB Inhale 1 puff into the lungs daily.  2  . carvedilol (COREG) 3.125 MG tablet Take 1 tablet (3.125 mg total) by mouth 2 (two) times daily with  a meal. 30 tablet 3  . clonazePAM (KLONOPIN) 0.5 MG tablet Take 1 tablet (0.5 mg total) by mouth 3 (three) times daily as needed (Anxiety). 45 tablet 0  . digoxin (LANOXIN) 0.125 MG tablet Take 1 tablet (0.125 mg total) by mouth daily. 30 tablet 3  . diphenhydrAMINE (BENADRYL) 25 MG tablet Take 75 mg by mouth every morning.    . folic acid (FOLVITE) 1 MG tablet Take 1 tablet (1 mg total) by mouth every morning. 30 tablet 6  . furosemide (LASIX) 40 MG tablet Take 1 tablet (40 mg total) by mouth 2 (two) times daily. 60 tablet 0  . hydroxychloroquine (PLAQUENIL) 200 MG tablet Take 200 mg by mouth 2 (two) times daily.    Marland Kitchen losartan (COZAAR) 25 MG tablet Take 1 tablet (25 mg total) by mouth daily. 30 tablet 3  . meloxicam (MOBIC) 15 MG tablet Take 15 mg by mouth daily.     . methotrexate (RHEUMATREX) 2.5 MG tablet Take 20 mg by mouth once a week. Thursdays  3  . Multiple Vitamin (MULTIVITAMIN WITH MINERALS) TABS Take 1 tablet by mouth every morning.     . ondansetron (ZOFRAN) 4 MG tablet Take 1 tablet (4 mg total) by mouth every 8 (eight) hours as needed for nausea or vomiting. 20 tablet 0  . potassium chloride SA (K-DUR,KLOR-CON) 20 MEQ tablet Take 3 tablets (60 mEq total) by mouth daily. 270 tablet 3  . predniSONE (DELTASONE) 20 MG tablet Take 1 tablet (20 mg total) by mouth daily with breakfast. 30 tablet 0  . spironolactone (ALDACTONE) 25 MG tablet Take 0.5 tablets (12.5 mg total) by mouth daily. 30 tablet 3  . traMADol (ULTRAM) 50 MG tablet Take 50 mg by mouth every 6 (six) hours as needed for pain.  0   No current facility-administered medications for this encounter.     Vitals:   11/25/16 1513  BP: (!) 168/102  Pulse: 98  SpO2: 100%  Weight: 194 lb 9.6 oz (88.3 kg)    PHYSICAL EXAM: General:  Well appearing. No resp difficulty. Ambulated in the clinic without difficulty.  HEENT: normal Neck: supple. JVP 7-8. Carotids 2+ bilaterally; no bruits. No lymphadenopathy or thryomegaly  appreciated. Cor: PMI normal. Regular rate & rhythm. No rubs, gallops or murmurs. Lungs: clear Abdomen: soft, nontender, nondistended. No hepatosplenomegaly. No bruits or masses. Good bowel sounds. Extremities: no cyanosis, clubbing, rash, R and LLE trace edema Neuro: alert & orientedx3, cranial nerves grossly intact. Moves all 4 extremities w/o difficulty. Affect pleasant.   ASSESSMENT & PLAN: 1. Chronic Systolic Heart Failure - NICM Cath 10/11/18 with normal cors PCWP  9    --Cardiac MRI in 02/2015 showed EF 46%. No LGE. 2D ECHO 12/2015 showed mild LVH, EF 35-40%, diff HK, normal diastolic function, mild MR, trivial pericardial effusion.Her last ischemic evaluation was in 08/2014 and was a low-risk NST.   --Echo 10/11/2016  EF 20-25%. Plan to repeat ECHO after HF meds optimized.  NYHA IIIb. Volume status stable. Continue lasix 40 mg twice a day. Continue Spiro 12.5 mg daily.  - Continue Carvedilol 3.125 mg bid. May need to stop due to fatigue but will continue for now.  - Stop losartan and start entresto 24-26 mg twice a day. I have asked her to monitor BP at home.    Continue digoxin 0.125 daily 2.  Lupus- Per Dr Eliberto Ivory.  3. HTN- Elevated today. As above stop losartan and start entresto 24-26 mg twice a day. Would like to to keep SBP <130.  5. Asthma 6. Fibromyalgia 7. H/O Cervical Cancer 7. H/O AKI- recent creatinine 0.98 on 10/28/2016    Follow up in 2 weeks and plan to check BMET at that time.   Amy Clegg NP-C  3:07 PM

## 2016-11-25 NOTE — Patient Instructions (Signed)
STOP Losartan START Entresto 24/26 mg one tab twice a day  Your physician recommends that you schedule a follow-up appointment in: 2 weeks  Do the following things EVERYDAY: 1) Weigh yourself in the morning before breakfast. Write it down and keep it in a log. 2) Take your medicines as prescribed 3) Eat low salt foods-Limit salt (sodium) to 2000 mg per day.  4) Stay as active as you can everyday 5) Limit all fluids for the day to less than 2 liters

## 2016-11-25 NOTE — Progress Notes (Signed)
Advanced Heart Failure Medication Review by a Pharmacist  Does the patient  feel that his/her medications are working for him/her?  yes  Has the patient been experiencing any side effects to the medications prescribed?  no  Does the patient measure his/her own blood pressure or blood glucose at home?  no   Does the patient have any problems obtaining medications due to transportation or finances?   no  Understanding of regimen: good Understanding of indications: good Potential of compliance: good Patient understands to avoid NSAIDs. Patient understands to avoid decongestants.  Issues to address at subsequent visits: None   Pharmacist comments:  Christine Mcdowell is a pleasant 45 yo F presenting with her medication bottles. She reports good compliance with her regimen and did not have any specific medication-related questions or concerns for me at this time.   Ruta Hinds. Velva Harman, PharmD, BCPS, CPP Clinical Pharmacist Pager: [phone] Phone: [phone] 11/25/2016 3:23 PM      Time with patient: 10 minutes Preparation and documentation time: 2 minutes Total time: 12 minutes

## 2016-11-26 NOTE — Addendum Note (Signed)
Encounter addended by: Kerry Dory, CMA on: 11/26/2016  9:33 AM<BR>    Actions taken: Order list changed

## 2016-12-04 ENCOUNTER — Telehealth (HOSPITAL_COMMUNITY): Payer: Self-pay

## 2016-12-04 NOTE — Telephone Encounter (Signed)
Cardiopulmonary exercise test  Order: [id number]  Status:  Final result Visible to patient:  Yes (MyChart) Dx:  Chronic systolic heart failure ([place])  Notes Recorded by Effie Berkshire, RN on 12/04/2016 at 10:12 AM EST Multiple attempts made to contact patient via phone. Detailed VM left with results on confidential VM line. ------  Notes Recorded by Conrad [place], NP on 12/03/2016 at 1:41 PM EST Please call her with the results. Low normal functional capacity. Main functional limitation was weight. Minimal circulatory limitation. The test was reviewed by Dr Haroldine Laws.

## 2016-12-05 ENCOUNTER — Telehealth (HOSPITAL_COMMUNITY): Payer: Self-pay

## 2016-12-05 NOTE — Telephone Encounter (Signed)
[facility] calling to get order from Dr. Haroldine Laws for patient to start program.  Order placed in epic pending his cosignature.  Renee Pain, RN

## 2016-12-09 ENCOUNTER — Ambulatory Visit (HOSPITAL_COMMUNITY)
Admission: RE | Admit: 2016-12-09 | Discharge: 2016-12-09 | Disposition: A | Discharge status: Home / Self Care | Payer: BLUE CROSS/BLUE SHIELD | Source: Ambulatory Visit | Attending: Cardiology | Admitting: Cardiology

## 2016-12-09 ENCOUNTER — Encounter (HOSPITAL_COMMUNITY): Payer: Self-pay

## 2016-12-09 ENCOUNTER — Encounter (HOSPITAL_COMMUNITY): Payer: Self-pay | Admitting: *Deleted

## 2016-12-09 VITALS — BP 142/88 | HR 91 | Wt 191.8 lb

## 2016-12-09 DIAGNOSIS — J45909 Unspecified asthma, uncomplicated: Secondary | ICD-10-CM | POA: Diagnosis not present

## 2016-12-09 DIAGNOSIS — I5022 Chronic systolic (congestive) heart failure: Secondary | ICD-10-CM | POA: Diagnosis not present

## 2016-12-09 DIAGNOSIS — I110 Hypertensive heart disease with heart failure: Secondary | ICD-10-CM | POA: Diagnosis not present

## 2016-12-09 DIAGNOSIS — R5383 Other fatigue: Secondary | ICD-10-CM

## 2016-12-09 DIAGNOSIS — I509 Heart failure, unspecified: Secondary | ICD-10-CM | POA: Diagnosis not present

## 2016-12-09 DIAGNOSIS — Z5189 Encounter for other specified aftercare: Secondary | ICD-10-CM | POA: Diagnosis not present

## 2016-12-09 DIAGNOSIS — M797 Fibromyalgia: Secondary | ICD-10-CM | POA: Insufficient documentation

## 2016-12-09 DIAGNOSIS — I429 Cardiomyopathy, unspecified: Secondary | ICD-10-CM | POA: Insufficient documentation

## 2016-12-09 LAB — BASIC METABOLIC PANEL
Anion gap: 7 (ref 5–15)
BUN: 13 mg/dL (ref 6–20)
CO2: 24 mmol/L (ref 22–32)
Calcium: 8.8 mg/dL — ABNORMAL LOW (ref 8.9–10.3)
Chloride: 109 mmol/L (ref 101–111)
Creatinine, Ser: 0.95 mg/dL (ref 0.44–1.00)
GFR calc Af Amer: >60 mL/min (ref >60)
GFR calc non Af Amer: >60 mL/min (ref >60)
Glucose, Bld: 99 mg/dL (ref 65–99)
Potassium: 3.7 mmol/L (ref 3.5–5.1)
Sodium: 140 mmol/L (ref 135–145)

## 2016-12-09 LAB — BRAIN NATRIURETIC PEPTIDE: B Natriuretic Peptide: 61.4 pg/mL (ref 0.0–100.0)

## 2016-12-09 MED ORDER — SACUBITRIL-VALSARTAN 49-51 MG PO TABS: 1.0000 | ORAL_TABLET | Freq: Two times a day (BID) | ORAL | 3 refills | Status: DC

## 2016-12-09 NOTE — Progress Notes (Signed)
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   Cardiac MR in 02/2015 showed EF 46%. No LGE.  2D ECHO 12/2015 showed mild LVH, EF 35-40%, diff HK, normal diastolic function, mild MR, trivial pericardial effusion.Her last ischemic evaluation was in 08/2014 and was a low-risk NST.  Admitted in October with increased dyspnea and volume overload. Diuresed with IV lasix and transitioned to lasix 40 mg twice a day. LHC/RHC normal filling pressures, EF ~20%, and normal coronaries. Discharge weight 184 pounds.   Today she returns for HF follow up. Last visit losartan was stopped and entresto was started 24-26 mg twice a day. Overall feeling ok. Complaining of fatigue. Remains SOB with exertion. Denies PND/Orthopnea. Weight at home trending up from 191-198 pounds.  SBP at home 140-160  Taking all medications.  Working full time as the Armed forces operational officer"   CPX 10/2016 Peak VO2: 18.5 (82% predicted peak VO2) VE/VCO2 slope: 30 OUES: 1.75 Peak RER: 1.14.  RHC/LHC 10/11/2016  RA = 4 RV = 24/5 PA = 23/4 (16) PCW = 9 Fick cardiac output/index = 4.4/2.2 SVR = 1580 PVR =  1.5 WU Ao sat = 95% PA sat = 58%, 62% Assessment: 1. Normal coronary arteries with separate ostial for LAD and LCX 2. Normal right heart pressures 3. NICM with EF ~20% by echo   ROS: All systems negative except as listed in HPI, PMH and Problem List.  SH:  Social History   Social History  . Marital status: Divorced    Spouse name: N/A  . Number of children: N/A  . Years of education: N/A   Occupational History  . Not on file.   Social History Main Topics  . Smoking status: Never Smoker  . Smokeless tobacco: Never Used  . Alcohol use No  .  Drug use: No  . Sexual activity: Not Currently   Other Topics Concern  . Not on file   Social History Narrative  . No narrative on file    FH:  Family History  Problem Relation Age of Onset  . Heart attack Father   . Diabetes Other   . Hypertension Other   . Heart attack Paternal Grandmother     Past Medical History:  Diagnosis Date  . Anemia   . Anginal pain ([place])   . Asthma   . Cervical cancer ([place])   . CHF (congestive heart failure) ([place])   . Diabetes mellitus without complication ([place])    steroid induced  . Discoid lupus   . Fibromyalgia   . History of blood transfusion "several"   "related to anemia; had some w/hysterectomy also"  . Hx of cardiovascular stress test    ETT-Myoview (9/15):  No ischemia, EF 52%; NORMAL  . Hx of echocardiogram    Echo (9/15):  EF 50-55%, ant HK, Gr 1 DD, mild MR, mild LAE, no effusion  . Hypertension   . Iron deficiency anemia    h/o iron transfusions  . Lupus (systemic lupus erythematosus) ([place])   . Migraine    "a few/year" (07/03/2016)  . Pneumonia 12/2015  . RA (rheumatoid arthritis) ([place])    "all over" (07/03/2016)  . Sickle cell trait ([place])   . Stroke ([place]) 2014 X 1; 2015 X 2;  2016 X 1;    "right side of face more relaxed than the other; rare speech hesitation" (07/03/2016)    Current Outpatient Prescriptions  Medication Sig Dispense Refill  . ACTEMRA 162 MG/0.9ML SOSY Inject 162 mg into the skin every 14 (fourteen) days. Thursday night around midnight (0000)    . albuterol (ACCUNEB) 1.25 MG/3ML nebulizer solution Take 3 mLs (1.25 mg total) by nebulization every 6 (six) hours as needed for wheezing. 75 mL 12  . albuterol (PROVENTIL HFA;VENTOLIN HFA) 108 (90 Base) MCG/ACT inhaler Inhale 2 puffs into the lungs daily as needed for shortness of breath.    Marland Kitchen aspirin 81 MG chewable tablet Chew 162 mg by mouth every morning.     Marland Kitchen BREO ELLIPTA 100-25 MCG/INH AEPB Inhale 1 puff into the lungs daily.  2  . carvedilol (COREG) 3.125 MG  tablet Take 1 tablet (3.125 mg total) by mouth 2 (two) times daily with a meal. 30 tablet 3  . clonazePAM (KLONOPIN) 0.5 MG tablet Take 1 tablet (0.5 mg total) by mouth 3 (three) times daily as needed (Anxiety). 45 tablet 0  . digoxin (LANOXIN) 0.125 MG tablet Take 1 tablet (0.125 mg total) by mouth daily. 30 tablet 3  . diphenhydrAMINE (BENADRYL) 25 MG tablet Take 75 mg by mouth every morning.    . folic acid (FOLVITE) 1 MG tablet Take 1 tablet (1 mg total) by mouth every morning. 30 tablet 6  . furosemide (LASIX) 40 MG tablet Take 1 tablet (40 mg total) by mouth 2 (two) times daily. 60 tablet 0  . hydroxychloroquine (PLAQUENIL) 200 MG tablet Take 200 mg by mouth 2 (two) times daily.    . meloxicam (MOBIC) 15 MG tablet Take 15 mg by mouth daily.     . methotrexate (RHEUMATREX) 2.5 MG tablet Take 20 mg by mouth once a week. Thursdays  3  . Multiple Vitamin (MULTIVITAMIN WITH MINERALS) TABS Take 1 tablet by mouth every morning.     . ondansetron (ZOFRAN) 4 MG tablet Take 1 tablet (4 mg total) by mouth every 8 (eight) hours as needed for nausea or vomiting. 20 tablet 0  . potassium chloride SA (K-DUR,KLOR-CON) 20 MEQ tablet Take 3 tablets (60 mEq total) by mouth daily. 270 tablet 3  . predniSONE (DELTASONE) 20 MG tablet Take 1 tablet (20 mg total) by mouth daily with breakfast. 30 tablet 0  . sacubitril-valsartan (ENTRESTO) 24-26 MG Take 1 tablet by mouth 2 (two) times daily. 60 tablet 6  . spironolactone (ALDACTONE) 25 MG tablet Take 0.5 tablets (12.5 mg total) by mouth daily. 30 tablet 3  . sulfamethoxazole-trimethoprim (BACTRIM DS,SEPTRA DS) 800-160 MG tablet Take 1 tablet by mouth 2 (two) times daily.    . traMADol (ULTRAM) 50 MG tablet Take 50 mg by mouth every 6 (six) hours as needed for pain.  0   No current facility-administered medications for this encounter.     Vitals:   12/09/16 1520  BP: (!) 142/88  Pulse: 91  SpO2: 95%  Weight: 191 lb 12.8 oz (87 kg)    PHYSICAL  EXAM: General:  Well appearing. No resp difficulty. Ambulated in the clinic without difficulty.  HEENT: normal Neck: supple. JVP 6-7. Carotids 2+ bilaterally; no bruits. No lymphadenopathy or thryomegaly appreciated. Cor: PMI normal. Regular rate & rhythm. No rubs, gallops or murmurs. Lungs: clear Abdomen: soft, nontender, nondistended. No hepatosplenomegaly. No bruits or masses. Good bowel sounds. Extremities: no cyanosis, clubbing, rash, R and LLE trace edema Neuro: alert & orientedx3,  cranial nerves grossly intact. Moves all 4 extremities w/o difficulty. Affect pleasant.   ASSESSMENT & PLAN: 1. Chronic Systolic Heart Failure - NICM Cath 10/11/18 with normal cors PCWP 9    --Cardiac MRI in 02/2015 showed EF 46%. No LGE. 2D ECHO 12/2015 showed mild LVH, EF 35-40%, diff HK, normal diastolic function, mild MR, trivial pericardial effusion.Her last ischemic evaluation was in 08/2014 and was a low-risk NST.   --Echo 10/11/2016  EF 20-25%. Plan to repeat ECHO after HF meds optimized early February NYHA IIIb. Volume status stable. Continue lasix 40 mg twice a day. Continue Spiro 12.5 mg daily.  - Continue Carvedilol 3.125 mg bid. May need to stop due to fatigue but will continue for now.  - Increase entresto to 49-51 mg twice a day.     Continue digoxin 0.125 daily Check BMEt and BNP today.  2.  Lupus- Per Dr Eliberto Ivory.  3. HTN- Elevated today. As above stop losartan and start entresto 24-26 mg twice a day. Would like to to keep SBP <130.  5. Asthma 6. Fibromyalgia  7. H/O Cervical Cancer 7. H/O AKI- recent creatinine 0.98 on 10/28/2016  8. Day Time Fatigue- Set up sleep study.    Follow up 4 weeks.   Amy Clegg NP-C   3:31 PM   Patient seen and examined with Darrick Grinder, NP. We discussed all aspects of the encounter. I agree with the assessment and plan as stated above.   Overall she is doing fairly well despite severe LV dysfunction. I reviewed the results of her recent RHC and CPX tests  with her in detail. Both reflect rather stable HF at this point with functional class II symptoms. She is having daytime fatigue and has poor sleep habits + snoring so we will proceed with CPX testing. Agree with increasing Entresto. We discussed the fact that I am hopeful her EF will improve but there is also the possibility that she will deteriorate. Will repeat echo in January. If EF <= 35% will need ICD. We will continue to follow closely.   Total time spent 40 minutes. Over half that time spent discussing above.

## 2016-12-09 NOTE — Patient Instructions (Signed)
Labs today (will call for abnormal results, otherwise no news is good news)  Increase Entresto to 49mg /51mg  (1 Tab) Two Times Daily  Sleep study has been ordered for you.  Follow up in 4 weeks

## 2016-12-10 DIAGNOSIS — Z8673 Personal history of transient ischemic attack (TIA), and cerebral infarction without residual deficits: Secondary | ICD-10-CM | POA: Diagnosis not present

## 2016-12-10 DIAGNOSIS — M329 Systemic lupus erythematosus, unspecified: Secondary | ICD-10-CM | POA: Diagnosis not present

## 2016-12-10 DIAGNOSIS — I110 Hypertensive heart disease with heart failure: Secondary | ICD-10-CM | POA: Diagnosis not present

## 2016-12-10 DIAGNOSIS — I509 Heart failure, unspecified: Secondary | ICD-10-CM | POA: Diagnosis not present

## 2016-12-10 DIAGNOSIS — E119 Type 2 diabetes mellitus without complications: Secondary | ICD-10-CM | POA: Diagnosis not present

## 2016-12-10 DIAGNOSIS — L02214 Cutaneous abscess of groin: Secondary | ICD-10-CM | POA: Diagnosis not present

## 2016-12-11 DIAGNOSIS — M329 Systemic lupus erythematosus, unspecified: Secondary | ICD-10-CM | POA: Diagnosis not present

## 2016-12-11 DIAGNOSIS — M0579 Rheumatoid arthritis with rheumatoid factor of multiple sites without organ or systems involvement: Secondary | ICD-10-CM | POA: Diagnosis not present

## 2016-12-11 DIAGNOSIS — Z79899 Other long term (current) drug therapy: Secondary | ICD-10-CM | POA: Diagnosis not present

## 2016-12-11 DIAGNOSIS — I5023 Acute on chronic systolic (congestive) heart failure: Secondary | ICD-10-CM | POA: Diagnosis not present

## 2016-12-25 ENCOUNTER — Telehealth (HOSPITAL_COMMUNITY): Payer: Self-pay | Admitting: *Deleted

## 2016-12-25 NOTE — Telephone Encounter (Signed)
Patient called in saying she has gained 10 lbs in the last 48 hours.  She has increased SOB on exertion along with swelling in hands and feet.  She has been taking her lasix as prescribed with no missed doses.    Discussed with Darrick Grinder, NP and she advises patient to double her lasix dose to 80 mg BID for two days only.  Patient is agreeable and will call us back if her symptoms do not improve over the next two days. Patient is already scheduled for a follow up appointment the end of January.  No further questions at this time.

## 2016-12-29 ENCOUNTER — Emergency Department (HOSPITAL_COMMUNITY): Payer: BLUE CROSS/BLUE SHIELD

## 2016-12-29 ENCOUNTER — Encounter (HOSPITAL_COMMUNITY): Payer: Self-pay

## 2016-12-29 ENCOUNTER — Emergency Department (HOSPITAL_COMMUNITY)
Admission: EM | Admit: 2016-12-29 | Discharge: 2016-12-30 | Disposition: A | Discharge status: Home / Self Care | Payer: BLUE CROSS/BLUE SHIELD | Attending: Emergency Medicine | Admitting: Emergency Medicine

## 2016-12-29 DIAGNOSIS — I110 Hypertensive heart disease with heart failure: Secondary | ICD-10-CM | POA: Diagnosis not present

## 2016-12-29 DIAGNOSIS — I509 Heart failure, unspecified: Secondary | ICD-10-CM

## 2016-12-29 DIAGNOSIS — Z7982 Long term (current) use of aspirin: Secondary | ICD-10-CM | POA: Diagnosis not present

## 2016-12-29 DIAGNOSIS — I5023 Acute on chronic systolic (congestive) heart failure: Secondary | ICD-10-CM | POA: Diagnosis not present

## 2016-12-29 DIAGNOSIS — J45909 Unspecified asthma, uncomplicated: Secondary | ICD-10-CM | POA: Diagnosis not present

## 2016-12-29 DIAGNOSIS — Z8541 Personal history of malignant neoplasm of cervix uteri: Secondary | ICD-10-CM | POA: Diagnosis not present

## 2016-12-29 DIAGNOSIS — Z79899 Other long term (current) drug therapy: Secondary | ICD-10-CM | POA: Diagnosis not present

## 2016-12-29 DIAGNOSIS — Z8673 Personal history of transient ischemic attack (TIA), and cerebral infarction without residual deficits: Secondary | ICD-10-CM | POA: Diagnosis not present

## 2016-12-29 DIAGNOSIS — Z91040 Latex allergy status: Secondary | ICD-10-CM | POA: Diagnosis not present

## 2016-12-29 DIAGNOSIS — R0602 Shortness of breath: Secondary | ICD-10-CM | POA: Diagnosis present

## 2016-12-29 LAB — BASIC METABOLIC PANEL
Anion gap: 11 (ref 5–15)
BUN: 10 mg/dL (ref 6–20)
CO2: 23 mmol/L (ref 22–32)
Calcium: 8.8 mg/dL — ABNORMAL LOW (ref 8.9–10.3)
Chloride: 104 mmol/L (ref 101–111)
Creatinine, Ser: 0.92 mg/dL (ref 0.44–1.00)
GFR calc Af Amer: >60 mL/min (ref >60)
GFR calc non Af Amer: >60 mL/min (ref >60)
Glucose, Bld: 121 mg/dL — ABNORMAL HIGH (ref 65–99)
Potassium: 4.2 mmol/L (ref 3.5–5.1)
Sodium: 138 mmol/L (ref 135–145)

## 2016-12-29 LAB — BRAIN NATRIURETIC PEPTIDE: B Natriuretic Peptide: 150.4 pg/mL — ABNORMAL HIGH (ref 0.0–100.0)

## 2016-12-29 LAB — CBC
HCT: 36.2 % (ref 36.0–46.0)
Hemoglobin: 11.9 g/dL — ABNORMAL LOW (ref 12.0–15.0)
MCH: 28.9 pg (ref 26.0–34.0)
MCHC: 32.9 g/dL (ref 30.0–36.0)
MCV: 87.9 fL (ref 78.0–100.0)
Platelets: 233 10*3/uL (ref 150–400)
RBC: 4.12 MIL/uL (ref 3.87–5.11)
RDW: 15.8 % — ABNORMAL HIGH (ref 11.5–15.5)
WBC: 9.2 10*3/uL (ref 4.0–10.5)

## 2016-12-29 LAB — I-STAT TROPONIN, ED: Troponin i, poc: 0 ng/mL (ref 0.00–0.08)

## 2016-12-29 MED ORDER — CLONAZEPAM 0.5 MG PO TABS
0.5000 mg | ORAL_TABLET | Freq: Once | ORAL | Status: AC
  Administered 2016-12-29: 0.5 mg via ORAL
  Filled 2016-12-29: qty 1

## 2016-12-29 MED ORDER — FUROSEMIDE 10 MG/ML IJ SOLN
60.0000 mg | Freq: Once | INTRAMUSCULAR | Status: AC
  Administered 2016-12-29: 60 mg via INTRAVENOUS
  Filled 2016-12-29: qty 6

## 2016-12-29 NOTE — Discharge Instructions (Addendum)
It was our pleasure to provide your ER care today - we hope that you feel better.  Continue your medications.  Limit salt intake, and follow a heart healthy eating plan.   Follow up with your cardiologist/CHF clinic in the next 1-2 days - call their office tomorrow morning to arrange follow up appointment. Take an extra dose of your lasix tomorrow.   Return to ER if worse, new symptoms, fevers, chest pain, increased trouble breathing, other concern.   You were given medication in the ER that can cause drowsiness, no driving for the next 4 hours.

## 2016-12-29 NOTE — ED Triage Notes (Signed)
Pt complaining of chest tightness and SOB since this AM. Pt states hx of CHF. Pt states taking meds as RX. Pt denies any cough. Pt states swelling in hands, feet and face. Pt sats 100% on RA @ triage.

## 2016-12-29 NOTE — ED Notes (Signed)
Pt became SOB as she attempted to walk down to the bathroom and back.  Reports she has gained 10lbs in the past two weeks.  She has CHF due to lupus.  She has an appointment tomorrow to have her kidneys checked b/c she has been taking 80mg  of lasix 2X a day and still has swollen hands and feet.

## 2016-12-29 NOTE — ED Provider Notes (Signed)
[facility] DEPT Provider Note   CSN: [id number] Arrival date & time: 12/29/16  1813     History   Chief Complaint Chief Complaint  Patient presents with  . Shortness of Breath  . Chest Pain    HPI Christine Mcdowell is a 46 y.o. female.  Patient w hx chf, non isch cm, presents c/o sob for the past couple weeks. States symptoms constant, persistent, without acute/abrupt change today. ?orthopnea. No pnd. States compliant w home meds. ?few lb weight increase, and mild increase bilateral foot and ankle edema. States chest felt tight for past couple days, but denies episodic or exertional cp, symptoms constant. Denies any pleuritic chest pain. Denies cough, sore throat, or uri symptoms. Denies joint pain, swelling or rash.  No fever or chills.    The history is provided by the patient.  Shortness of Breath  Associated symptoms include chest pain. Pertinent negatives include no fever, no headaches, no sore throat, no neck pain, no cough, no vomiting, no abdominal pain and no rash.  Chest Pain   Associated symptoms include shortness of breath. Pertinent negatives include no abdominal pain, no cough, no fever, no headaches and no vomiting.    Past Medical History:  Diagnosis Date  . Anemia   . Anginal pain ([place])   . Asthma   . Cervical cancer ([place])   . CHF (congestive heart failure) ([place])   . Diabetes mellitus without complication ([place])    steroid induced  . Discoid lupus   . Fibromyalgia   . History of blood transfusion "several"   "related to anemia; had some w/hysterectomy also"  . Hx of cardiovascular stress test    ETT-Myoview (9/15):  No ischemia, EF 52%; NORMAL  . Hx of echocardiogram    Echo (9/15):  EF 50-55%, ant HK, Gr 1 DD, mild MR, mild LAE, no effusion  . Hypertension   . Iron deficiency anemia    h/o iron transfusions  . Lupus (systemic lupus erythematosus) ([place])   . Migraine    "a few/year" (07/03/2016)  . Pneumonia 12/2015  . RA (rheumatoid arthritis)  ([place])    "all over" (07/03/2016)  . Sickle cell trait ([place])   . Stroke ([place]) 2014 X 1; 2015 X 2; 2016 X 1;    "right side of face more relaxed than the other; rare speech hesitation" (07/03/2016)    Patient Active Problem List   Diagnosis Date Noted  . Acute on chronic respiratory failure with hypoxia ([place])   . Congestive heart failure (CHF) ([place]) 10/09/2016  . Respiratory distress 10/09/2016  . Rheumatoid arthritis ([place]) 10/09/2016  . Asthma 10/09/2016  . Chronic pain 10/09/2016  . Heart failure ([place]) 10/09/2016  . Congestive heart failure ([place])   . Chest pain 07/03/2016  . Hypertensive crisis 07/03/2016  . Hypertensive urgency 07/03/2016  . Thrombocytopenia ([place]) 07/03/2016  . Lupus (systemic lupus erythematosus) ([place])   . Diabetes mellitus with complication ([place])   . Hypertensive emergency   . Anxiety state   . GERD (gastroesophageal reflux disease) 12/28/2015  . Acute on chronic systolic congestive heart failure ([place])   . Essential hypertension 12/26/2015  . Type 2 diabetes mellitus ([place]) 12/26/2015  . Acute on chronic systolic CHF (congestive heart failure), NYHA class 3 ([place]) 12/26/2015  . Chronic systolic heart failure ([place]) 08/18/2014  . 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 Hypokalemia 01/27/2014  . Shortness of breath 01/26/2014  . SLE (systemic lupus erythematosus) ([place]) 01/26/2014    Past Surgical History:  Procedure Laterality  Date  . ABDOMINAL HYSTERECTOMY  2009  . ABDOMINAL WOUND DEHISCENCE  2009  . CARDIAC CATHETERIZATION N/A 10/11/2016   Procedure: Right/Left Heart Cath and Coronary Angiography;  Surgeon: Jolaine Artist, MD;  Location: [facility];  Service: Cardiovascular;  Laterality: N/A;  . DILATION AND CURETTAGE OF UTERUS  1991  . HEMATOMA EVACUATION  2009   abdomen  . INCISE AND DRAIN ABCESS  2009 X 2   "abdomen after hysterectomy"  . KNEE ARTHROSCOPY Right 1997  . TUBAL LIGATION  1996    OB History    No data available        Home Medications    Prior to Admission medications   Medication Sig Start Date End Date Taking? Authorizing Provider  ACTEMRA 162 MG/0.9ML SOSY Inject 162 mg into the skin every 14 (fourteen) days. Thursday night around midnight (0000) 11/09/15   Historical Provider, MD  albuterol (ACCUNEB) 1.25 MG/3ML nebulizer solution Take 3 mLs (1.25 mg total) by nebulization every 6 (six) hours as needed for wheezing. 03/04/16   Lacretia Leigh, MD  albuterol (PROVENTIL HFA;VENTOLIN HFA) 108 (90 Base) MCG/ACT inhaler Inhale 2 puffs into the lungs daily as needed for shortness of breath.    Historical Provider, MD  aspirin 81 MG chewable tablet Chew 162 mg by mouth every morning.     Historical Provider, MD  BREO ELLIPTA 100-25 MCG/INH AEPB Inhale 1 puff into the lungs daily. 07/16/16   Historical Provider, MD  carvedilol (COREG) 3.125 MG tablet Take 1 tablet (3.125 mg total) by mouth 2 (two) times daily with a meal. 11/18/16   Jolaine Artist, MD  clonazePAM (KLONOPIN) 0.5 MG tablet Take 1 tablet (0.5 mg total) by mouth 3 (three) times daily as needed (Anxiety). 10/14/16   Maryann Mikhail, DO  digoxin (LANOXIN) 0.125 MG tablet Take 1 tablet (0.125 mg total) by mouth daily. 10/28/16   Amy D Clegg, NP  diphenhydrAMINE (BENADRYL) 25 MG tablet Take 75 mg by mouth every morning.    Historical Provider, MD  folic acid (FOLVITE) 1 MG tablet Take 1 tablet (1 mg total) by mouth every morning. 01/04/16   Liliane Shi, PA-C  furosemide (LASIX) 40 MG tablet Take 1 tablet (40 mg total) by mouth 2 (two) times daily. 10/14/16   Maryann Mikhail, DO  hydroxychloroquine (PLAQUENIL) 200 MG tablet Take 200 mg by mouth 2 (two) times daily.    Historical Provider, MD  meloxicam (MOBIC) 15 MG tablet Take 15 mg by mouth daily.     Historical Provider, MD  methotrexate (RHEUMATREX) 2.5 MG tablet Take 20 mg by mouth once a week. Thursdays 02/06/15   Historical Provider, MD  Multiple Vitamin (MULTIVITAMIN WITH MINERALS) TABS Take  1 tablet by mouth every morning.     Historical Provider, MD  ondansetron (ZOFRAN) 4 MG tablet Take 1 tablet (4 mg total) by mouth every 8 (eight) hours as needed for nausea or vomiting. 12/30/15   Robbie Lis, MD  potassium chloride SA (K-DUR,KLOR-CON) 20 MEQ tablet Take 3 tablets (60 mEq total) by mouth daily. 10/28/16   Amy D Ninfa Meeker, NP  predniSONE (DELTASONE) 20 MG tablet Take 1 tablet (20 mg total) by mouth daily with breakfast. 12/30/15   Robbie Lis, MD  sacubitril-valsartan (ENTRESTO) 49-51 MG Take 1 tablet by mouth 2 (two) times daily. 12/09/16   Amy D Ninfa Meeker, NP  spironolactone (ALDACTONE) 25 MG tablet Take 0.5 tablets (12.5 mg total) by mouth daily. 10/28/16   Amy D  Clegg, NP  sulfamethoxazole-trimethoprim (BACTRIM DS,SEPTRA DS) 800-160 MG tablet Take 1 tablet by mouth 2 (two) times daily.    Historical Provider, MD  traMADol (ULTRAM) 50 MG tablet Take 50 mg by mouth every 6 (six) hours as needed for pain. 09/26/16   Historical Provider, MD    Family History Family History  Problem Relation Age of Onset  . Heart attack Father   . Diabetes Other   . Hypertension Other   . Heart attack Paternal Grandmother     Social History Social History  Substance Use Topics  . Smoking status: Never Smoker  . Smokeless tobacco: Never Used  . Alcohol use No     Allergies   Hydromorphone; Iodinated diagnostic agents; Erythromycin; Latex; and Other   Review of Systems Review of Systems  Constitutional: Negative for chills and fever.  HENT: Negative for sore throat.   Eyes: Negative for redness.  Respiratory: Positive for shortness of breath. Negative for cough.   Cardiovascular: Positive for chest pain.  Gastrointestinal: Negative for abdominal pain, diarrhea and vomiting.  Genitourinary: Negative for flank pain.  Musculoskeletal: Negative for arthralgias, joint swelling, myalgias and neck pain.  Skin: Negative for rash.  Neurological: Negative for headaches.  Hematological: Does not  bruise/bleed easily.  Psychiatric/Behavioral: Negative for confusion.     Physical Exam Updated Vital Signs BP (!) 170/124 (BP Location: Right Arm)   Pulse 87   Temp 97.7 F (36.5 C) (Oral)   Resp (!) 27   SpO2 100%   Physical Exam  Constitutional: She appears well-developed and well-nourished. No distress.  HENT:  Mouth/Throat: Oropharynx is clear and moist.  Eyes: Conjunctivae are normal. No scleral icterus.  Neck: Neck supple. No tracheal deviation present.  Cardiovascular: Normal rate, regular rhythm, normal heart sounds and intact distal pulses.  Exam reveals no gallop and no friction rub.   No murmur heard. Pulmonary/Chest: Effort normal and breath sounds normal. No respiratory distress.  Abdominal: Soft. Normal appearance and bowel sounds are normal. She exhibits no distension. There is no tenderness.  Musculoskeletal:  Mild bilateral foot and ankle edema, symmetric.  Swelling does not extend up legs.   Neurological: She is alert.  Skin: Skin is warm and dry. No rash noted. She is not diaphoretic.  Psychiatric: She has a normal mood and affect.  Nursing note and vitals reviewed.    ED Treatments / Results  Labs (all labs ordered are listed, but only abnormal results are displayed) Results for orders placed or performed during the hospital encounter of [id number]  Basic metabolic panel  Result Value Ref Range   Sodium 138 135 - 145 mmol/L   Potassium 4.2 3.5 - 5.1 mmol/L   Chloride 104 101 - 111 mmol/L   CO2 23 22 - 32 mmol/L   Glucose, Bld 121 (H) 65 - 99 mg/dL   BUN 10 6 - 20 mg/dL   Creatinine, Ser 0.92 0.44 - 1.00 mg/dL   Calcium 8.8 (L) 8.9 - 10.3 mg/dL   GFR calc non Af Amer >60 >60 mL/min   GFR calc Af Amer >60 >60 mL/min   Anion gap 11 5 - 15  CBC  Result Value Ref Range   WBC 9.2 4.0 - 10.5 K/uL   RBC 4.12 3.87 - 5.11 MIL/uL   Hemoglobin 11.9 (L) 12.0 - 15.0 g/dL   HCT 36.2 36.0 - 46.0 %   MCV 87.9 78.0 - 100.0 fL   MCH 28.9 26.0 - 34.0 pg   MCHC  32.9  30.0 - 36.0 g/dL   RDW 15.8 (H) 11.5 - 15.5 %   Platelets 233 150 - 400 K/uL  I-stat troponin, ED  Result Value Ref Range   Troponin i, poc 0.00 0.00 - 0.08 ng/mL   Comment 3           Dg Chest 2 View  Result Date: 12/29/2016 CLINICAL DATA:  Shortness of breath and left-sided chest tightness EXAM: CHEST  2 VIEW COMPARISON:  October 12, 2016 FINDINGS: There is atelectatic change in the left base. Lungs elsewhere are clear. Heart is borderline enlarged with pulmonary vascularity within normal limits. No adenopathy. No bone lesions. IMPRESSION: Left base atelectasis. Lungs elsewhere clear. Heart borderline enlarged with pulmonary vascularity within normal limits. Electronically Signed   By: Lowella Grip III M.D.   On: 12/29/2016 19:05    EKG  EKG Interpretation  Date/Time:  Sunday December 29 2016 18:17:06 EST Ventricular Rate:  91 PR Interval:  182 QRS Duration: 78 QT Interval:  338 QTC Calculation: 415 R Axis:   -34 Text Interpretation:  Normal sinus rhythm Left axis deviation No significant change since last tracing Confirmed by Ashok Cordia  MD, Lennette Bihari ([id number]) on 12/29/2016 7:29:11 PM       Radiology Dg Chest 2 View  Result Date: 12/29/2016 CLINICAL DATA:  Shortness of breath and left-sided chest tightness EXAM: CHEST  2 VIEW COMPARISON:  October 12, 2016 FINDINGS: There is atelectatic change in the left base. Lungs elsewhere are clear. Heart is borderline enlarged with pulmonary vascularity within normal limits. No adenopathy. No bone lesions. IMPRESSION: Left base atelectasis. Lungs elsewhere clear. Heart borderline enlarged with pulmonary vascularity within normal limits. Electronically Signed   By: Lowella Grip III M.D.   On: 12/29/2016 19:05    Procedures Procedures (including critical care time)  Medications Ordered in ED Medications  furosemide (LASIX) injection 60 mg (not administered)  clonazePAM (KLONOPIN) tablet 0.5 mg (not administered)     Initial  Impression / Assessment and Plan / ED Course  I have reviewed the triage vital signs and the nursing notes.  Pertinent labs & imaging results that were available during my care of the patient were reviewed by me and considered in my medical decision making (see chart for details).  Clinical Course     Iv ns. Monitor. Labs.  Lasix 60 mg iv.  After constant symptoms x days, trop neg.   Recent cardiac cath with no signif cad.  ?mild anxiety - will give dose of her klonopin.   Recheck, pt ambulatory in ED, no increased wob. Pulse ox 99%.   Patient currently appears stable for d/c.  rec close f/u card heart failure clin in the next couple days.  Return precautions provided.     Final Clinical Impressions(s) / ED Diagnoses   Final diagnoses:  None    New Prescriptions New Prescriptions   No medications on file     Lajean Saver, MD 12/29/16 2242

## 2016-12-30 DIAGNOSIS — R809 Proteinuria, unspecified: Secondary | ICD-10-CM | POA: Diagnosis not present

## 2017-01-13 ENCOUNTER — Telehealth (HOSPITAL_COMMUNITY): Payer: Self-pay | Admitting: *Deleted

## 2017-01-13 MED ORDER — METOLAZONE 2.5 MG PO TABS: 2.5000 mg | ORAL_TABLET | ORAL | 1 refills | Status: DC

## 2017-01-13 NOTE — Telephone Encounter (Signed)
Can take extra lasix for next few days and keep appt on 01/16/17.     If she is taking 40 mg BID can take 80 mg BID x 2 days. If she is taking 80 mg BID, can we have her take 2.5 mg metoalzone tomorrow with extra 20 meq of K.     Legrand Como [address] [place], PA-C 01/13/2017 11:34 AM

## 2017-01-13 NOTE — Telephone Encounter (Signed)
Spoke w/pt, she states she has been taking Lasix 80 mg BID, new rx for Metolazone sent in, she is agreeable to also take an extra KCL and will keep appt on Thur.  She states over past few days she has also been having increased SOB w/activity even just getting ready for work.  Advised to take med as directed and f/u on Thur, again she is agreeable

## 2017-01-13 NOTE — Telephone Encounter (Signed)
Pt left a VM earlier today stating her BP has been running high for past 4-5 days, she states it has ranged between 149/100-157/93-180/120.  She states her wt is also up about 5-7 lbs, she c/o HA and increased fatigue.  She stated she has been taking all meds including higher dose Entresto which she felt was helping her BP until the past week.  She stated she will be at work all day and unable to answer any calls back, she ask that we please leave her a detailed message with our recommendations.  Per chart pt's Christine Mcdowell was increased to 49/51 mg on 12/18 at OV, unsure of pt's Lasix dose, chart states 40 mg BID then 80 mg BID so unsure what pt is actually taking.  Pt is sch for an echo and appt w/Dr Bensimhon on [facility] 01/16/17.    Will send to [place], [place] for further recommendations.

## 2017-01-14 ENCOUNTER — Telehealth (HOSPITAL_COMMUNITY): Payer: Self-pay | Admitting: Pharmacist

## 2017-01-14 NOTE — Telephone Encounter (Signed)
Entresto 49-51 mg BID PA approved by [employer] [place] commercial through 12/22/38.   Ruta Hinds. Velva Harman, PharmD, BCPS, CPP Clinical Pharmacist Pager: [phone] Phone: [phone] 01/14/2017 10:59 AM

## 2017-01-15 ENCOUNTER — Other Ambulatory Visit (HOSPITAL_COMMUNITY): Payer: Self-pay | Admitting: *Deleted

## 2017-01-15 DIAGNOSIS — I5022 Chronic systolic (congestive) heart failure: Secondary | ICD-10-CM

## 2017-01-16 ENCOUNTER — Ambulatory Visit (HOSPITAL_COMMUNITY)
Admission: RE | Admit: 2017-01-16 | Discharge: 2017-01-16 | Disposition: A | Discharge status: Home / Self Care | Payer: 59 | Source: Ambulatory Visit | Attending: Internal Medicine | Admitting: Internal Medicine

## 2017-01-16 ENCOUNTER — Encounter (HOSPITAL_COMMUNITY): Payer: Self-pay | Admitting: *Deleted

## 2017-01-16 ENCOUNTER — Ambulatory Visit (HOSPITAL_BASED_OUTPATIENT_CLINIC_OR_DEPARTMENT_OTHER)
Admission: RE | Admit: 2017-01-16 | Discharge: 2017-01-16 | Disposition: A | Discharge status: Home / Self Care | Payer: 59 | Source: Ambulatory Visit | Attending: Internal Medicine | Admitting: Internal Medicine

## 2017-01-16 ENCOUNTER — Encounter (HOSPITAL_COMMUNITY): Payer: Self-pay | Admitting: Internal Medicine

## 2017-01-16 VITALS — BP 150/94 | HR 86 | Wt 189.0 lb

## 2017-01-16 DIAGNOSIS — I5022 Chronic systolic (congestive) heart failure: Secondary | ICD-10-CM | POA: Insufficient documentation

## 2017-01-16 DIAGNOSIS — I340 Nonrheumatic mitral (valve) insufficiency: Secondary | ICD-10-CM | POA: Insufficient documentation

## 2017-01-16 DIAGNOSIS — I313 Pericardial effusion (noninflammatory): Secondary | ICD-10-CM | POA: Insufficient documentation

## 2017-01-16 LAB — ECHOCARDIOGRAM COMPLETE
Ao-asc: 31 cm
E decel time: 217 msec
E/e' ratio: 9.24
FS: 26 % — AB (ref 28–44)
IVS/LV PW RATIO, ED: 0.8
LA ID, A-P, ES: 36 mm
LA diam end sys: 36 mm
LA diam index: 1.75 cm/m2
LA vol A4C: 33.6 ml
LA vol index: 19.5 mL/m2
LA vol: 40.2 mL
LV E/e' medial: 9.24
LV E/e'average: 9.24
LV PW d: 8.72 mm — AB (ref 0.6–1.1)
LV dias vol index: 67 mL/m2
LV dias vol: 138 mL — AB (ref 46–106)
LV e' LATERAL: 7.72 cm/s
LV sys vol index: 36 mL/m2
LV sys vol: 75 mL — AB (ref 14–42)
LVOT SV: 64 mL
LVOT VTI: 18.4 cm
LVOT area: 3.46 cm2
LVOT diameter: 21 mm
LVOT peak vel: 104 cm/s
Lateral S' vel: 12.7 cm/s
MV Dec: 217
MV Peak grad: 2 mmHg
MV pk A vel: 97.5 m/s
MV pk E vel: 71.3 m/s
PISA EROA: 0.1 cm2
Simpson's disk: 46
Stroke v: 63 ml
TAPSE: 32 mm
TDI e' lateral: 7.72
VTI: 182 cm

## 2017-01-16 LAB — BASIC METABOLIC PANEL
Anion gap: 9 (ref 5–15)
BUN: 18 mg/dL (ref 6–20)
CO2: 24 mmol/L (ref 22–32)
Calcium: 9.1 mg/dL (ref 8.9–10.3)
Chloride: 105 mmol/L (ref 101–111)
Creatinine, Ser: 0.86 mg/dL (ref 0.44–1.00)
GFR calc Af Amer: >60 mL/min (ref >60)
GFR calc non Af Amer: >60 mL/min (ref >60)
Glucose, Bld: 167 mg/dL — ABNORMAL HIGH (ref 65–99)
Potassium: 4 mmol/L (ref 3.5–5.1)
Sodium: 138 mmol/L (ref 135–145)

## 2017-01-16 LAB — BRAIN NATRIURETIC PEPTIDE: B Natriuretic Peptide: 114.2 pg/mL — ABNORMAL HIGH (ref 0.0–100.0)

## 2017-01-16 MED ORDER — ISOSORB DINITRATE-HYDRALAZINE 20-37.5 MG PO TABS: 0.5000 | ORAL_TABLET | Freq: Three times a day (TID) | ORAL | 3 refills | Status: DC

## 2017-01-16 MED ORDER — SACUBITRIL-VALSARTAN 97-103 MG PO TABS: 1.0000 | ORAL_TABLET | Freq: Two times a day (BID) | ORAL | 3 refills | Status: DC

## 2017-01-16 NOTE — Patient Instructions (Signed)
Increase Entresto to 97/103 mg Twice daily   Start Bidil 1/2 tab Three times a day   Labs today  You have been referred to EP to discuss a defibrillator  Your physician recommends that you schedule a follow-up appointment in: 1 month    Cardioverter Defibrillator Implantation An implantable cardioverter defibrillator (ICD) is a small, lightweight, battery-powered device that is placed (implanted) under the skin in the chest or abdomen. Your caregiver may prescribe an ICD if:  You have had an irregular heart rhythm (arrhythmia) that originated in the lower chambers of the heart (ventricles).  Your heart has been damaged by a disease (such as coronary artery disease) or heart condition (such as a heart attack). An ICD consists of a battery that lasts several years, a small computer called a pulse generator, and wires called leads that go into the heart. It is used to detect and correct two dangerous arrhythmias: a rapid heart rhythm (tachycardia) and an arrhythmia in which the ventricles contract in an uncoordinated way (fibrillation). When an ICD detects tachycardia, it sends an electrical signal to the heart that restores the heartbeat to normal (cardioversion). This signal is usually painless. If cardioversion does not work or if the ICD detects fibrillation, it delivers a small electrical shock to the heart (defibrillation) to restart the heart. The shock may feel like a strong jolt in the chest.ICDs may be programmed to correct other problems. Sometimes, ICDs are programmed to act as another type of implantable device called a pacemaker. Pacemakers are used to treat a slow heartbeat (bradycardia). What are the risks? Generally, the procedure to implant an ICD is safe. However, as with any surgical procedure, complications can occur. Possible complications associated with implanting an ICD include:  Swelling, bleeding, or bruising at the site where the ICD was implanted.  Infection at the  site where the ICD was implanted.  A reaction to medicine used during the procedure.  Nerve, heart, or blood vessel damage.  Blood clots. What happens before the procedure?  You may need to have blood tests, heart tests, or a chest X-ray done before the day of the procedure.  Ask your caregiver about changing or stopping your regular medicines.  Make plans to have someone drive you home. You may need to stay in the hospital overnight after the procedure.  Stop smoking at least 24 hours before the procedure.  Take a bath or shower the night before the procedure. You may need to scrub your chest or abdomen with a special type of soap.  Do not eat or drink before your procedure for as long as directed by your caregiver. Ask if it is okay to take any needed medicine with a small sip of water. What happens during the procedure? The procedure to implant an ICD in your chest or abdomen is usually done at a hospital in a room that has a large X-ray machine called a fluoroscope. The machine will be above you during the procedure. It will help your caregiver see your heart during the procedure. Implanting an ICD usually takes 1-3 hours. Before the procedure:  Small monitors will be put on your body. They will be used to check your heart, blood pressure, and oxygen level.  A needle will be put into a vein in your hand or arm. This is called an intravenous (IV) access tube. Fluids and medicine will flow directly into your body through the IV tube.  Your chest or abdomen will be cleaned with a germ-killing (  antiseptic) solution. The area may be shaved.  You may be given medicine to help you relax (sedative).  You will be given a medicine called a local anesthetic. This medicine will make the surgical site numb while the ICD is implanted. You will be sleepy but awake during the procedure. After you are numb the procedure will begin. The caregiver will:  Make a small cut (incision). This will make  a pocket deep under your skin that will hold the pulse generator.  Guide the leads through a large blood vessel into your heart and attach them to the heart muscles. Depending on the ICD, the leads may go into one ventricle or they may go to both ventricles and into an upper chamber of the heart (atrium).  Test the ICD.  Close the incision with stitches, glue, or staples. What happens after the procedure?  You may feel pain. Some pain is normal. It may last a few days.  You may stay in a recovery area until the local anesthetic has worn off. Your blood pressure and pulse will be checked often. You will be taken to a room where your heart will be monitored.  A chest X-ray will be taken. This is done to check that the cardioverter defibrillator is in the right place.  You may stay in the hospital overnight.  A slight bump may be seen over the skin where the ICD was placed. Sometimes, it is possible to feel the ICD under the skin. This is normal.  In the months and years afterward, your caregiver will check the device, the leads, and the battery every few months. Eventually, when the battery is low, the ICD will be replaced. Contact a health care provider if:  Any allergies you have.  All medicines you are taking, including vitamins, herbs, eyedrops, and over-the-counter medicines and creams.  Previous problems you or members of your family have had with the use of anesthetics.  Any blood disorders you have had.  Other health problems you have. This information is not intended to replace advice given to you by your health care provider. Make sure you discuss any questions you have with your health care provider. Document Released: 08/31/2002 Document Revised: 05/16/2016 Document Reviewed: 12/28/2012 Elsevier Interactive Patient Education  2017 [employer].

## 2017-01-16 NOTE — Progress Notes (Signed)
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   Cardiac MR in 02/2015 showed EF 46%. No LGE.  2D ECHO 12/2015 showed mild LVH, EF 35-40%, diff HK, normal diastolic function, mild MR, trivial pericardial effusion.  Admitted in October 2017 with increased dyspnea and volume overload. Diuresed with IV lasix and transitioned to lasix 40 mg twice a day. LHC/RHC normal filling pressures, EF ~20%, and normal coronaries. Discharge weight 184 pounds.   Today she returns for HF follow up and to review recent CPX study. At last visit Entresto increased to 49/51. Felt good for a while but now BP back up in 150-170 range. Overall feeling ok. Still with some fatigue. Remains SOB with exertion. Denies PND/Orthopnea. Prednisone increased to 40mg  daily for SLE flare.  Weight at home was trending up. Lasix increased to 80 bid. Weight now back down. Taking all medications.    CPX 10/2016 Peak VO2: 18.5 (82% predicted peak VO2) VE/VCO2 slope: 30 OUES: 1.75 Peak RER: 1.14.  RHC/LHC 10/11/2016  RA = 4 RV = 24/5 PA = 23/4 (16) PCW = 9 Fick cardiac output/index = 4.4/2.2 SVR = 1580 PVR =  1.5 WU Ao sat = 95% PA sat = 58%, 62% Assessment: 1. Normal coronary arteries with separate ostial for LAD and LCX 2. Normal right heart pressures 3. NICM with EF ~20% by echo   ROS: All systems negative except as listed in HPI, PMH and Problem List.  SH:  Social History   Social History  . Marital status: Divorced    Spouse name: N/A  . Number of children: N/A  . Years of education: N/A   Occupational History  . Not on file.   Social History Main Topics  . Smoking status: Never Smoker  . Smokeless  tobacco: Never Used  . Alcohol use No  . Drug use: No  . Sexual activity: Not Currently   Other Topics Concern  . Not on file   Social History Narrative  . No narrative on file    FH:  Family History  Problem Relation Age of Onset  . Heart attack Father   . Diabetes Other   . Hypertension Other   . Heart attack Paternal Grandmother     Past Medical History:  Diagnosis Date  . Anemia   . Anginal pain ([place])   . Asthma   . Cervical cancer ([place])   . CHF (congestive heart failure) ([place])   . Diabetes mellitus without complication ([place])    steroid induced  . Discoid lupus   . Fibromyalgia   . History of blood transfusion "several"   "related to anemia; had some w/hysterectomy also"  . Hx of cardiovascular stress test    ETT-Myoview (9/15):  No ischemia, EF 52%; NORMAL  . Hx of echocardiogram    Echo (9/15):  EF 50-55%, ant HK, Gr 1 DD, mild MR, mild LAE, no effusion  . Hypertension   . Iron deficiency anemia    h/o iron transfusions  . Lupus (systemic lupus erythematosus) ([place])   . Migraine    "a few/year" (07/03/2016)  . Pneumonia 12/2015  . RA (rheumatoid arthritis) ([place])    "all over" (07/03/2016)  . Sickle cell trait ([place])   .  Stroke ([place]) 2014 X 1; 2015 X 2; 2016 X 1;    "right side of face more relaxed than the other; rare speech hesitation" (07/03/2016)    Current Outpatient Prescriptions  Medication Sig Dispense Refill  . ACTEMRA 162 MG/0.9ML SOSY Inject 162 mg into the skin every 14 (fourteen) days. Thursday night around midnight (0000)    . albuterol (ACCUNEB) 1.25 MG/3ML nebulizer solution Take 3 mLs (1.25 mg total) by nebulization every 6 (six) hours as needed for wheezing. 75 mL 12  . albuterol (PROVENTIL HFA;VENTOLIN HFA) 108 (90 Base) MCG/ACT inhaler Inhale 2 puffs into the lungs daily as needed for shortness of breath.    Marland Kitchen aspirin 81 MG chewable tablet Chew 162 mg by mouth every morning.     Marland Kitchen BREO ELLIPTA 100-25 MCG/INH AEPB Inhale 1 puff into the  lungs daily.  2  . carvedilol (COREG) 3.125 MG tablet Take 1 tablet (3.125 mg total) by mouth 2 (two) times daily with a meal. 30 tablet 3  . clonazePAM (KLONOPIN) 0.5 MG tablet Take 1 tablet (0.5 mg total) by mouth 3 (three) times daily as needed (Anxiety). 45 tablet 0  . digoxin (LANOXIN) 0.125 MG tablet Take 1 tablet (0.125 mg total) by mouth daily. 30 tablet 3  . diphenhydrAMINE (BENADRYL) 25 MG tablet Take 75 mg by mouth 3 (three) times daily.     . folic acid (FOLVITE) 1 MG tablet Take 1 tablet (1 mg total) by mouth every morning. 30 tablet 6  . furosemide (LASIX) 40 MG tablet Take 80 mg by mouth 2 (two) times daily.    . hydroxychloroquine (PLAQUENIL) 200 MG tablet Take 200 mg by mouth 2 (two) times daily.    . insulin aspart (NOVOLOG) 100 UNIT/ML injection Inject 0-5 Units into the skin daily as needed for high blood sugar (over 150).    . meloxicam (MOBIC) 15 MG tablet Take 15 mg by mouth daily.     . methotrexate (RHEUMATREX) 2.5 MG tablet Take 20 mg by mouth once a week. Thursdays  3  . Multiple Vitamin (MULTIVITAMIN WITH MINERALS) TABS Take 1 tablet by mouth every morning.     . ondansetron (ZOFRAN) 4 MG tablet Take 1 tablet (4 mg total) by mouth every 8 (eight) hours as needed for nausea or vomiting. 20 tablet 0  . potassium chloride SA (K-DUR,KLOR-CON) 20 MEQ tablet Take 3 tablets (60 mEq total) by mouth daily. 270 tablet 3  . predniSONE (DELTASONE) 20 MG tablet Take 40 mg by mouth daily with breakfast.    . sacubitril-valsartan (ENTRESTO) 49-51 MG Take 1 tablet by mouth 2 (two) times daily. 60 tablet 3  . spironolactone (ALDACTONE) 25 MG tablet Take 0.5 tablets (12.5 mg total) by mouth daily. 30 tablet 3  . traMADol (ULTRAM) 50 MG tablet Take 50 mg by mouth every 6 (six) hours as needed for pain.  0  . metolazone (ZAROXOLYN) 2.5 MG tablet Take 1 tablet (2.5 mg total) by mouth as directed. (Patient not taking: Reported on 01/16/2017) 5 tablet 1   No current facility-administered  medications for this encounter.     Vitals:   01/16/17 1436  BP: (!) 150/94  Pulse: 86  SpO2: 100%  Weight: 189 lb (85.7 kg)    PHYSICAL EXAM: General:  Well appearing. No resp difficulty. Ambulated in the clinic without difficulty.  HEENT: normal Neck: supple. JVP 6. Carotids 2+ bilaterally; no bruits. No lymphadenopathy or thryomegaly appreciated. Cor: PMI normal. Regular rate & rhythm. No rubs,  gallops or murmurs. Lungs: clear Abdomen: soft, nontender, nondistended. No hepatosplenomegaly. No bruits or masses. Good bowel sounds. Extremities: no cyanosis, clubbing, rash, R and LLE trace edema Neuro: alert & orientedx3, cranial nerves grossly intact. Moves all 4 extremities w/o difficulty. Affect pleasant.   ASSESSMENT & PLAN: 1. Chronic Systolic Heart Failure - NICM Cath 10/11/16 with normal cors PCWP 9  --Cardiac MRI in 02/2015 showed EF 46%. No LGE.  --2D ECHO 12/2015 showed mild LVH, EF 35-40%, diff HK, normal diastolic function, mild MR, trivial pericardial effusion. --Echo 10/11/2016  EF 20-25% --Overall improved. NYHA II. Volume status looks good --CPX reviewed with her and family at length. Very mild HF limitation. No high-risk features. -Continue lasix 80 mg twice a day. Continue Spiro 12.5 mg daily.  -Continue Carvedilol 3.125 mg bid. May need to stop due to fatigue but will continue for now. - Increase entresto to 97-103 mg twice a day.   -Add bidil 1/2 tab tid -Continue digoxin 0.125 daily -Check BMET and BNP today.  -Discussed role of ICD. Will refer to EP for evaluation. 2.  Lupus- Per Dr Eliberto Ivory.  3. HTN- Elevated today. Increaese entresto 97-103 mg twice a day. Add Bidil. Would like to to keep SBP <130.  5. Asthma 6. Fibromyalgia  7. H/O Cervical Cancer 8. Day Time Fatigue --sleep study still pending  Bensimhon, Daniel,MD 6:25 PM

## 2017-01-16 NOTE — Progress Notes (Signed)
Echocardiogram 2D Echocardiogram has been performed.  Christine Mcdowell 01/16/2017, 2:38 PM

## 2017-01-21 ENCOUNTER — Other Ambulatory Visit (HOSPITAL_COMMUNITY): Payer: Self-pay | Admitting: *Deleted

## 2017-01-21 ENCOUNTER — Institutional Professional Consult (permissible substitution): Payer: 59 | Admitting: Internal Medicine

## 2017-01-21 NOTE — Progress Notes (Deleted)
ELECTROPHYSIOLOGY CONSULT NOTE  Patient ID: Christine Mcdowell, MRN: [id number], DOB/AGE: 06-22-1971 46 y.o. Admit date: (Not on file) Date of Consult: 01/21/2017  Primary Physician: Maximino Greenland, MD Primary Cardiologist: *** Consulting Physician ***  Chief Complaint: ***   HPI Christine Mcdowell is a 46 y.o. female  referred for consideration of an ICD.  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 Cardiac MRI 3/16 had demonstrated no late gadolinium enhancement. Most recent LVEF at catheterization/17 was 20% undertaken after she presented with heart failure.    Past Medical History:  Diagnosis Date  . Anemia   . Anginal pain ([place])   . Asthma   . Cervical cancer ([place])   . CHF (congestive heart failure) ([place])   . Diabetes mellitus without complication ([place])    steroid induced  . Discoid lupus   . Fibromyalgia   . History of blood transfusion "several"   "related to anemia; had some w/hysterectomy also"  . Hx of cardiovascular stress test    ETT-Myoview (9/15):  No ischemia, EF 52%; NORMAL  . Hx of echocardiogram    Echo (9/15):  EF 50-55%, ant HK, Gr 1 DD, mild MR, mild LAE, no effusion  . Hypertension   . Iron deficiency anemia    h/o iron transfusions  . Lupus (systemic lupus erythematosus) ([place])   . Migraine    "a few/year" (07/03/2016)  . Pneumonia 12/2015  . RA (rheumatoid arthritis) ([place])    "all over" (07/03/2016)  . Sickle cell trait ([place])   . Stroke ([place]) 2014 X 1; 2015 X 2; 2016 X 1;    "right side of face more relaxed than the other; rare speech hesitation" (07/03/2016)      Surgical History:  Past Surgical History:  Procedure Laterality Date  . ABDOMINAL HYSTERECTOMY  2009  . ABDOMINAL WOUND DEHISCENCE  2009  . CARDIAC CATHETERIZATION N/A 10/11/2016   Procedure: Right/Left Heart Cath and Coronary Angiography;  Surgeon: Jolaine Artist, MD;  Location: [facility];  Service: Cardiovascular;  Laterality: N/A;  . DILATION AND CURETTAGE OF UTERUS  1991  . HEMATOMA EVACUATION  2009   abdomen  . INCISE AND DRAIN ABCESS  2009 X 2   "abdomen after hysterectomy"  . KNEE ARTHROSCOPY Right 1997  . TUBAL LIGATION  1996     Home Meds: Prior to Admission medications   Medication Sig Start Date End Date Taking? Authorizing Provider  ACTEMRA 162 MG/0.9ML SOSY Inject 162 mg into the skin every 14 (fourteen) days. Thursday night around midnight (0000) 11/09/15   Historical Provider, MD  albuterol (ACCUNEB) 1.25 MG/3ML nebulizer solution Take 3 mLs (1.25 mg total) by nebulization every 6 (six) hours as needed for wheezing. 03/04/16   Lacretia Leigh, MD  albuterol (PROVENTIL HFA;VENTOLIN HFA) 108 (90 Base) MCG/ACT inhaler Inhale 2 puffs into the lungs daily as needed for shortness of breath.    Historical Provider, MD  aspirin 81 MG chewable tablet Chew 162 mg by mouth every morning.     Historical Provider, MD  BREO ELLIPTA 100-25 MCG/INH AEPB Inhale 1 puff into the lungs daily. 07/16/16   Historical Provider, MD  carvedilol (COREG) 3.125 MG tablet Take 1 tablet (3.125 mg total) by mouth 2 (two) times daily with a meal. 11/18/16   Jolaine Artist, MD  clonazePAM (KLONOPIN) 0.5 MG tablet Take 1 tablet (0.5 mg total) by mouth 3 (three) times daily  as needed (Anxiety). 10/14/16   Maryann Mikhail, DO  digoxin (LANOXIN) 0.125 MG tablet Take 1 tablet (0.125 mg total) by mouth daily. 10/28/16   Amy D Clegg, NP  diphenhydrAMINE (BENADRYL) 25 MG tablet Take 75 mg by mouth 3 (three) times daily.     Historical Provider, MD  folic acid (FOLVITE) 1 MG tablet Take 1 tablet (1 mg total) by mouth every morning. 01/04/16   Liliane Shi, PA-C  furosemide (LASIX) 40 MG tablet Take 80 mg by mouth 2 (two) times daily.    Historical Provider, MD  hydroxychloroquine (PLAQUENIL) 200 MG tablet Take 200 mg by mouth 2 (two) times daily.    Historical Provider, MD  insulin aspart (NOVOLOG)  100 UNIT/ML injection Inject 0-5 Units into the skin daily as needed for high blood sugar (over 150).    Historical Provider, MD  isosorbide-hydrALAZINE (BIDIL) 20-37.5 MG tablet Take 0.5 tablets by mouth 3 (three) times daily. 01/16/17   Jolaine Artist, MD  meloxicam (MOBIC) 15 MG tablet Take 15 mg by mouth daily.     Historical Provider, MD  methotrexate (RHEUMATREX) 2.5 MG tablet Take 20 mg by mouth once a week. Thursdays 02/06/15   Historical Provider, MD  metolazone (ZAROXOLYN) 2.5 MG tablet Take 1 tablet (2.5 mg total) by mouth as directed. Patient not taking: Reported on 01/16/2017 01/13/17 04/13/17  Shirley Friar, PA-C  Multiple Vitamin (MULTIVITAMIN WITH MINERALS) TABS Take 1 tablet by mouth every morning.     Historical Provider, MD  ondansetron (ZOFRAN) 4 MG tablet Take 1 tablet (4 mg total) by mouth every 8 (eight) hours as needed for nausea or vomiting. 12/30/15   Robbie Lis, MD  potassium chloride SA (K-DUR,KLOR-CON) 20 MEQ tablet Take 3 tablets (60 mEq total) by mouth daily. 10/28/16   Amy D Clegg, NP  predniSONE (DELTASONE) 20 MG tablet Take 40 mg by mouth daily with breakfast.    Historical Provider, MD  sacubitril-valsartan (ENTRESTO) 97-103 MG Take 1 tablet by mouth 2 (two) times daily. 01/16/17   Jolaine Artist, MD  spironolactone (ALDACTONE) 25 MG tablet Take 0.5 tablets (12.5 mg total) by mouth daily. 10/28/16   Amy D Ninfa Meeker, NP  traMADol (ULTRAM) 50 MG tablet Take 50 mg by mouth every 6 (six) hours as needed for pain. 09/26/16   Historical Provider, MD    Allergies:  Allergies  Allergen Reactions  . Hydromorphone Nausea And Vomiting    Other reaction(s): GI Upset (intolerance), Hypertension (intolerance) Raises blood pressure   . Iodinated Diagnostic Agents Other (See Comments)    Flu like symptoms, lupus flare During MRI ([place])  . Erythromycin Nausea And Vomiting  . Latex Hives  . Other Other (See Comments)    Skin Prep "makes my skin peel off" Paper tape  causes skin burns    Social History   Social History  . Marital status: Divorced    Spouse name: N/A  . Number of children: N/A  . Years of education: N/A   Occupational History  . Not on file.   Social History Main Topics  . Smoking status: Never Smoker  . Smokeless tobacco: Never Used  . Alcohol use No  . Drug use: No  . Sexual activity: Not Currently   Other Topics Concern  . Not on file   Social History Narrative  . No narrative on file     Family History  Problem Relation Age of Onset  . Heart attack Father   . Diabetes  Other   . Hypertension Other   . Heart attack Paternal Grandmother      ROS:  Please see the history of present illness.   {ros [id number]}  All other systems reviewed and negative.    Physical Exam:*** There were no vitals taken for this visit. General: Well developed, well nourished female in no acute distress. Head: Normocephalic, atraumatic, sclera non-icteric, no xanthomas, nares are without discharge. EENT: normal  Lymph Nodes:  none Neck: Negative for carotid bruits. JVD not elevated. Back:without scoliosis kyphosis*** Lungs: Clear bilaterally to auscultation without wheezes, rales, or rhonchi. Breathing is unlabored. Heart: RRR with S1 S2. No *** ***/6 systolic*** murmur . No rubs, or gallops appreciated. Abdomen: Soft, non-tender, non-distended with normoactive bowel sounds. No hepatomegaly. No rebound/guarding. No obvious abdominal masses. Msk:  Strength and tone appear normal for age. Extremities: No clubbing or cyanosis. No*** ***+*** edema.  Distal pedal pulses are 2+ and equal bilaterally. Skin: Warm and Dry Neuro: Alert and oriented X 3. CN III-XII intact Grossly normal sensory and motor function . Psych:  Responds to questions appropriately with a normal affect.      Labs: Cardiac Enzymes No results for input(s): CKTOTAL, CKMB, TROPONINI in the last 72 hours. CBC Lab Results  Component Value Date   WBC 9.2  12/29/2016   HGB 11.9 (L) 12/29/2016   HCT 36.2 12/29/2016   MCV 87.9 12/29/2016   PLT 233 12/29/2016   PROTIME: No results for input(s): LABPROT, INR in the last 72 hours. Chemistry  Recent Labs Lab 01/16/17 1519  NA 138  K 4.0  CL 105  CO2 24  BUN 18  CREATININE 0.86  CALCIUM 9.1  GLUCOSE 167*   Lipids No results found for: CHOL, HDL, LDLCALC, TRIG BNP Pro B Natriuretic peptide (BNP)  Date/Time Value Ref Range Status  02/28/2015 10:05 AM 42.0 0.0 - 100.0 pg/mL Final  02/03/2015 10:33 AM 90.0 0.0 - 100.0 pg/mL Final  06/30/2014 02:43 PM 13.5 0 - 125 pg/mL Final  03/30/2014 09:08 AM 39.5 0 - 125 pg/mL Final   Thyroid Function Tests: No results for input(s): TSH, T4TOTAL, T3FREE, THYROIDAB in the last 72 hours.  Invalid input(s): FREET3 Miscellaneous Lab Results  Component Value Date   DDIMER 0.60 (H) 07/03/2016    Radiology/Studies:  Dg Chest 2 View  Result Date: 12/29/2016 CLINICAL DATA:  Shortness of breath and left-sided chest tightness EXAM: CHEST  2 VIEW COMPARISON:  October 12, 2016 FINDINGS: There is atelectatic change in the left base. Lungs elsewhere are clear. Heart is borderline enlarged with pulmonary vascularity within normal limits. No adenopathy. No bone lesions. IMPRESSION: Left base atelectasis. Lungs elsewhere clear. Heart borderline enlarged with pulmonary vascularity within normal limits. Electronically Signed   By: Lowella Grip III M.D.   On: 12/29/2016 19:05    EKG: ***   Assessment and Plan: *** Virl Axe

## 2017-01-24 ENCOUNTER — Other Ambulatory Visit (HOSPITAL_COMMUNITY): Payer: Self-pay | Admitting: *Deleted

## 2017-01-24 ENCOUNTER — Encounter (HOSPITAL_BASED_OUTPATIENT_CLINIC_OR_DEPARTMENT_OTHER): Payer: BLUE CROSS/BLUE SHIELD

## 2017-01-24 ENCOUNTER — Encounter (HOSPITAL_COMMUNITY): Payer: Self-pay | Admitting: Pharmacist

## 2017-01-28 ENCOUNTER — Other Ambulatory Visit: Payer: Self-pay | Admitting: Physician Assistant

## 2017-01-28 ENCOUNTER — Other Ambulatory Visit (HOSPITAL_COMMUNITY): Payer: Self-pay | Admitting: Internal Medicine

## 2017-01-30 ENCOUNTER — Telehealth (HOSPITAL_COMMUNITY): Payer: Self-pay | Admitting: Pharmacist

## 2017-01-30 MED ORDER — HYDRALAZINE HCL 25 MG PO TABS: 25.0000 mg | ORAL_TABLET | Freq: Three times a day (TID) | ORAL | 5 refills | Status: DC

## 2017-01-30 MED ORDER — ISOSORBIDE MONONITRATE ER 30 MG PO TB24: 30.0000 mg | ORAL_TABLET | Freq: Every day | ORAL | 5 refills | Status: DC

## 2017-01-30 NOTE — Telephone Encounter (Signed)
Bidil APPEAL denied by [facility]. Will switch to separate components of isosorbide mononitrate 30 mg daily and hydralazine 25 mg TID.   Ruta Hinds. Velva Harman, PharmD, BCPS, CPP Clinical Pharmacist Pager: [phone] Phone: [phone] 01/30/2017 2:04 PM

## 2017-02-03 ENCOUNTER — Telehealth (HOSPITAL_COMMUNITY): Payer: Self-pay

## 2017-02-03 NOTE — Telephone Encounter (Signed)
Stop imdur.

## 2017-02-03 NOTE — Telephone Encounter (Signed)
Patient calling CHF clinic triage to report severe migraine since starting hydralazine and imdur and has been unable to sleep or get out of bed due to severity. Asked if there is anything to take to offset the migraine side effect, otherwise patient states she will be unable to take this. Will forward to Dr. Haroldine Laws and CHF clinic PharmD Ileene Patrick to further advise.  Renee Pain, RN

## 2017-02-05 ENCOUNTER — Other Ambulatory Visit (HOSPITAL_COMMUNITY): Payer: Self-pay | Admitting: Internal Medicine

## 2017-02-05 NOTE — Telephone Encounter (Signed)
Patient aware and agreeable. Advised to continue hydralazine as well and call office Friday or Monday to update on status of s/s.  Renee Pain, RN

## 2017-02-17 ENCOUNTER — Encounter (HOSPITAL_COMMUNITY): Payer: 59

## 2017-02-18 ENCOUNTER — Ambulatory Visit (HOSPITAL_BASED_OUTPATIENT_CLINIC_OR_DEPARTMENT_OTHER): Payer: BLUE CROSS/BLUE SHIELD | Attending: Adult Health | Admitting: Cardiology

## 2017-02-18 DIAGNOSIS — G4733 Obstructive sleep apnea (adult) (pediatric): Secondary | ICD-10-CM | POA: Diagnosis not present

## 2017-02-18 DIAGNOSIS — R5383 Other fatigue: Secondary | ICD-10-CM | POA: Diagnosis not present

## 2017-02-20 ENCOUNTER — Other Ambulatory Visit (HOSPITAL_COMMUNITY): Payer: Self-pay | Admitting: Student

## 2017-02-21 DIAGNOSIS — Z794 Long term (current) use of insulin: Secondary | ICD-10-CM | POA: Diagnosis not present

## 2017-02-21 DIAGNOSIS — I517 Cardiomegaly: Secondary | ICD-10-CM | POA: Diagnosis not present

## 2017-02-21 DIAGNOSIS — M797 Fibromyalgia: Secondary | ICD-10-CM | POA: Diagnosis not present

## 2017-02-21 DIAGNOSIS — R000 Tachycardia, unspecified: Secondary | ICD-10-CM | POA: Diagnosis not present

## 2017-02-21 DIAGNOSIS — J45909 Unspecified asthma, uncomplicated: Secondary | ICD-10-CM | POA: Diagnosis not present

## 2017-02-21 DIAGNOSIS — M791 Myalgia: Secondary | ICD-10-CM | POA: Diagnosis not present

## 2017-02-21 DIAGNOSIS — I110 Hypertensive heart disease with heart failure: Secondary | ICD-10-CM | POA: Diagnosis not present

## 2017-02-21 DIAGNOSIS — R112 Nausea with vomiting, unspecified: Secondary | ICD-10-CM | POA: Diagnosis not present

## 2017-02-21 DIAGNOSIS — I5022 Chronic systolic (congestive) heart failure: Secondary | ICD-10-CM | POA: Diagnosis not present

## 2017-02-21 DIAGNOSIS — J111 Influenza due to unidentified influenza virus with other respiratory manifestations: Secondary | ICD-10-CM | POA: Diagnosis not present

## 2017-02-21 DIAGNOSIS — E119 Type 2 diabetes mellitus without complications: Secondary | ICD-10-CM | POA: Diagnosis not present

## 2017-02-21 DIAGNOSIS — R079 Chest pain, unspecified: Secondary | ICD-10-CM | POA: Diagnosis not present

## 2017-02-21 DIAGNOSIS — M329 Systemic lupus erythematosus, unspecified: Secondary | ICD-10-CM | POA: Diagnosis not present

## 2017-02-22 DIAGNOSIS — Z23 Encounter for immunization: Secondary | ICD-10-CM | POA: Diagnosis not present

## 2017-02-28 ENCOUNTER — Other Ambulatory Visit (HOSPITAL_COMMUNITY): Payer: Self-pay | Admitting: Adult Health

## 2017-03-02 NOTE — Procedures (Signed)
   Patient Name: Christine Mcdowell, Christine Mcdowell Date: 02/18/2017 Gender: Female D.O.B: [birth year] Age (years): 46 Referring Provider: Darrick Grinder NP Height (inches): 67 Interpreting Physician: [name] MD, ABSM Weight (lbs): 185 RPSGT: [name] BMI: 29 MRN: [id number] Neck Size: 16.00  CLINICAL INFORMATION Sleep Study Type: NPSG  Indication for sleep study: Diabetes, Excessive Daytime Sleepiness, Fatigue, Hypertension, Morning Headaches, Snoring  Epworth Sleepiness Score: 17  SLEEP STUDY TECHNIQUE As per the AASM Manual for the Scoring of Sleep and Associated Events v2.3 (April 2016) with a hypopnea requiring 4% desaturations.  The channels recorded and monitored were frontal, central and occipital EEG, electrooculogram (EOG), submentalis EMG (chin), nasal and oral airflow, thoracic and abdominal wall motion, anterior tibialis EMG, snore microphone, electrocardiogram, and pulse oximetry.  MEDICATIONS Medications self-administered by patient taken the night of the study : COREG, HYDRALAZINE, HYDROCHLOROQUINE, [place] The study was initiated at 11:01:29 PM and ended at 5:07:02 AM.  Sleep onset time was 10.9 minutes and the sleep efficiency was 94.1%. The total sleep time was 344.1 minutes.  Stage REM latency was 49.0 minutes.  The patient spent 10.75% of the night in stage N1 sleep, 63.09% in stage N2 sleep, 0.00% in stage N3 and 26.15% in REM.  Alpha intrusion was absent.  Supine sleep was 20.78%.  RESPIRATORY PARAMETERS The overall apnea/hypopnea index (AHI) was 7.1 per hour. There were 6 total apneas, including 2 obstructive, 4 central and 0 mixed apneas. There were 35 hypopneas and 9 RERAs.  The AHI during Stage REM sleep was 18.7 per hour.  AHI while supine was 2.5 per hour.  The mean oxygen saturation was 93.94%. The minimum SpO2 during sleep was 85.00%.  Moderate snoring was noted during this study.  CARDIAC DATA The 2 lead EKG  demonstrated sinus rhythm. The mean heart rate was 79.42 beats per minute. Other EKG findings include: None.  LEG MOVEMENT DATA The total PLMS were 0 with a resulting PLMS index of 0.00. Associated arousal with leg movement index was 0.0 .  IMPRESSIONS - Mild obstructive sleep apnea occurred during this study (AHI = 7.1/h). - No significant central sleep apnea occurred during this study (CAI = 0.7/h). - Mild oxygen desaturation was noted during this study (Min O2 = 85.00%). - The patient snored with Moderate snoring volume. - No cardiac abnormalities were noted during this study. - Clinically significant periodic limb movements did not occur during sleep. No significant associated arousals.  DIAGNOSIS - Obstructive Sleep Apnea (327.23 [G47.33 ICD-10])  RECOMMENDATIONS - Mild obstructive sleep apnea. Given significant excessive daytime sleepiness and moderate sleep apnea during REM sleep, recommend proceeding with CPAP titration.  - Avoid alcohol, sedatives and other CNS depressants that may worsen sleep apnea and disrupt normal sleep architecture. - Sleep hygiene should be reviewed to assess factors that may improve sleep quality. - Weight management and regular exercise should be initiated or continued if appropriate.  [place], American Board of Sleep Medicine  ELECTRONICALLY SIGNED ON:  03/02/2017, 1:00 PM [place] PH: [phone]   FX: [phone] [place]

## 2017-03-07 ENCOUNTER — Telehealth: Payer: Self-pay | Admitting: *Deleted

## 2017-03-07 DIAGNOSIS — G4733 Obstructive sleep apnea (adult) (pediatric): Secondary | ICD-10-CM

## 2017-03-07 NOTE — Telephone Encounter (Signed)
-----   Message from Sueanne Margarita, MD sent at 03/02/2017  1:03 PM EDT ----- Please let patient know that they have sleep apnea and recommend CPAP titration. Please set up titration in the sleep lab.

## 2017-03-07 NOTE — Telephone Encounter (Signed)
Called the patient and gave her the results and recommendations, she verbalized understanding and agreed. Cpap titration ordered

## 2017-03-10 DIAGNOSIS — I7300 Raynaud's syndrome without gangrene: Secondary | ICD-10-CM | POA: Diagnosis not present

## 2017-03-10 DIAGNOSIS — L659 Nonscarring hair loss, unspecified: Secondary | ICD-10-CM | POA: Diagnosis not present

## 2017-03-10 DIAGNOSIS — R601 Generalized edema: Secondary | ICD-10-CM | POA: Diagnosis not present

## 2017-03-10 DIAGNOSIS — M329 Systemic lupus erythematosus, unspecified: Secondary | ICD-10-CM | POA: Diagnosis not present

## 2017-03-10 DIAGNOSIS — R21 Rash and other nonspecific skin eruption: Secondary | ICD-10-CM | POA: Diagnosis not present

## 2017-03-10 DIAGNOSIS — R760 Raised antibody titer: Secondary | ICD-10-CM | POA: Diagnosis not present

## 2017-03-10 DIAGNOSIS — M0579 Rheumatoid arthritis with rheumatoid factor of multiple sites without organ or systems involvement: Secondary | ICD-10-CM | POA: Diagnosis not present

## 2017-03-10 DIAGNOSIS — Z7952 Long term (current) use of systemic steroids: Secondary | ICD-10-CM | POA: Diagnosis not present

## 2017-03-10 DIAGNOSIS — M1990 Unspecified osteoarthritis, unspecified site: Secondary | ICD-10-CM | POA: Diagnosis not present

## 2017-03-10 DIAGNOSIS — Z9221 Personal history of antineoplastic chemotherapy: Secondary | ICD-10-CM | POA: Diagnosis not present

## 2017-03-10 DIAGNOSIS — I5022 Chronic systolic (congestive) heart failure: Secondary | ICD-10-CM | POA: Diagnosis not present

## 2017-03-11 ENCOUNTER — Encounter: Payer: Self-pay | Admitting: *Deleted

## 2017-03-13 ENCOUNTER — Other Ambulatory Visit (HOSPITAL_COMMUNITY): Payer: Self-pay | Admitting: Internal Medicine

## 2017-03-13 ENCOUNTER — Other Ambulatory Visit: Payer: Self-pay | Admitting: Physician Assistant

## 2017-03-17 ENCOUNTER — Other Ambulatory Visit: Payer: Self-pay | Admitting: Internal Medicine

## 2017-03-17 MED ORDER — FUROSEMIDE 40 MG PO TABS: 80.0000 mg | ORAL_TABLET | Freq: Two times a day (BID) | ORAL | 6 refills | Status: DC

## 2017-03-17 NOTE — Telephone Encounter (Signed)
[facility] pharmacy requesting a refill on pt' furosemide. Please advise

## 2017-03-24 ENCOUNTER — Other Ambulatory Visit (HOSPITAL_COMMUNITY): Payer: Self-pay | Admitting: Internal Medicine

## 2017-03-28 ENCOUNTER — Other Ambulatory Visit (HOSPITAL_COMMUNITY): Payer: Self-pay | Admitting: Cardiology

## 2017-04-04 ENCOUNTER — Other Ambulatory Visit: Payer: Self-pay | Admitting: *Deleted

## 2017-04-09 ENCOUNTER — Telehealth (HOSPITAL_COMMUNITY): Payer: Self-pay | Admitting: *Deleted

## 2017-04-09 ENCOUNTER — Emergency Department (HOSPITAL_COMMUNITY)
Admission: EM | Admit: 2017-04-09 | Discharge: 2017-04-09 | Disposition: A | Discharge status: Home / Self Care | Payer: BLUE CROSS/BLUE SHIELD | Attending: Emergency Medicine | Admitting: Emergency Medicine

## 2017-04-09 ENCOUNTER — Emergency Department (HOSPITAL_COMMUNITY): Payer: BLUE CROSS/BLUE SHIELD

## 2017-04-09 ENCOUNTER — Other Ambulatory Visit: Payer: Self-pay

## 2017-04-09 ENCOUNTER — Encounter (HOSPITAL_COMMUNITY): Payer: Self-pay | Admitting: *Deleted

## 2017-04-09 DIAGNOSIS — R600 Localized edema: Secondary | ICD-10-CM | POA: Insufficient documentation

## 2017-04-09 DIAGNOSIS — I110 Hypertensive heart disease with heart failure: Secondary | ICD-10-CM | POA: Diagnosis not present

## 2017-04-09 DIAGNOSIS — Z8673 Personal history of transient ischemic attack (TIA), and cerebral infarction without residual deficits: Secondary | ICD-10-CM | POA: Insufficient documentation

## 2017-04-09 DIAGNOSIS — Z79899 Other long term (current) drug therapy: Secondary | ICD-10-CM | POA: Diagnosis not present

## 2017-04-09 DIAGNOSIS — Z8541 Personal history of malignant neoplasm of cervix uteri: Secondary | ICD-10-CM | POA: Diagnosis not present

## 2017-04-09 DIAGNOSIS — R609 Edema, unspecified: Secondary | ICD-10-CM

## 2017-04-09 DIAGNOSIS — E119 Type 2 diabetes mellitus without complications: Secondary | ICD-10-CM | POA: Diagnosis not present

## 2017-04-09 DIAGNOSIS — Z91040 Latex allergy status: Secondary | ICD-10-CM | POA: Diagnosis not present

## 2017-04-09 DIAGNOSIS — I5023 Acute on chronic systolic (congestive) heart failure: Secondary | ICD-10-CM | POA: Diagnosis not present

## 2017-04-09 DIAGNOSIS — R079 Chest pain, unspecified: Secondary | ICD-10-CM | POA: Diagnosis not present

## 2017-04-09 DIAGNOSIS — R0602 Shortness of breath: Secondary | ICD-10-CM | POA: Diagnosis not present

## 2017-04-09 DIAGNOSIS — J45909 Unspecified asthma, uncomplicated: Secondary | ICD-10-CM | POA: Insufficient documentation

## 2017-04-09 LAB — URINALYSIS, ROUTINE W REFLEX MICROSCOPIC
Bilirubin Urine: NEGATIVE
Glucose, UA: >=500 mg/dL — AB
Hgb urine dipstick: NEGATIVE
Ketones, ur: 5 mg/dL — AB
Leukocytes, UA: NEGATIVE
Nitrite: NEGATIVE
Protein, ur: NEGATIVE mg/dL
Specific Gravity, Urine: 1.028 (ref 1.005–1.030)
pH: 6 (ref 5.0–8.0)

## 2017-04-09 LAB — CBC
HCT: 35.6 % — ABNORMAL LOW (ref 36.0–46.0)
Hemoglobin: 11.3 g/dL — ABNORMAL LOW (ref 12.0–15.0)
MCH: 28.6 pg (ref 26.0–34.0)
MCHC: 31.7 g/dL (ref 30.0–36.0)
MCV: 90.1 fL (ref 78.0–100.0)
Platelets: 216 10*3/uL (ref 150–400)
RBC: 3.95 MIL/uL (ref 3.87–5.11)
RDW: 14 % (ref 11.5–15.5)
WBC: 10.6 10*3/uL — ABNORMAL HIGH (ref 4.0–10.5)

## 2017-04-09 LAB — BASIC METABOLIC PANEL
Anion gap: 10 (ref 5–15)
BUN: 10 mg/dL (ref 6–20)
CO2: 24 mmol/L (ref 22–32)
Calcium: 8.5 mg/dL — ABNORMAL LOW (ref 8.9–10.3)
Chloride: 104 mmol/L (ref 101–111)
Creatinine, Ser: 0.86 mg/dL (ref 0.44–1.00)
GFR calc Af Amer: >60 mL/min (ref >60)
GFR calc non Af Amer: >60 mL/min (ref >60)
Glucose, Bld: 231 mg/dL — ABNORMAL HIGH (ref 65–99)
Potassium: 3.1 mmol/L — ABNORMAL LOW (ref 3.5–5.1)
Sodium: 138 mmol/L (ref 135–145)

## 2017-04-09 LAB — BRAIN NATRIURETIC PEPTIDE: B Natriuretic Peptide: 110.6 pg/mL — ABNORMAL HIGH (ref 0.0–100.0)

## 2017-04-09 LAB — I-STAT TROPONIN, ED: Troponin i, poc: 0 ng/mL (ref 0.00–0.08)

## 2017-04-09 MED ORDER — FUROSEMIDE 10 MG/ML IJ SOLN
40.0000 mg | INTRAMUSCULAR | Status: AC
  Administered 2017-04-09: 40 mg via INTRAVENOUS
  Filled 2017-04-09: qty 4

## 2017-04-09 MED ORDER — TORSEMIDE 20 MG PO TABS: 20.0000 mg | ORAL_TABLET | Freq: Every day | ORAL | 0 refills | Status: DC
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
## 2017-04-09 NOTE — ED Triage Notes (Signed)
Pt states she's sent here by Dr Zoila Shutter for increased fluid retention.  States L chest pain and fatigue on exertion for several days.  States sob per norm.  Lost her sister 1 week ago to CHF.

## 2017-04-09 NOTE — ED Notes (Signed)
Called Pt to room back. No answer in lobby

## 2017-04-09 NOTE — Discharge Instructions (Signed)
Stop Lasix Start Torsemide twice daily (80mg  per dose) ER if you have worsening symptoms CHF clinic next week - call in the morning for appointment

## 2017-04-09 NOTE — ED Notes (Signed)
Pt speaking with medical student. She has dyspnea at rest and more sob with speaking

## 2017-04-09 NOTE — Telephone Encounter (Signed)
Patient called triage line reporting that she has had increased swelling in her legs/feet for over a month now.  She also reported that she has been taking 160 mg of lasix BID for at least a month.  Patient reported that she is hardly voiding and he weight is up 7-10 lbs.    I spoke with Barrington Ellison, PA and he wanted to see her in clinic on Friday at 12:00.  I called patient back and she stated that she is leaving for [place] tomorrow morning and will be gone for at least a week.  Barrington Ellison, PA is concerned that her kidneys could be failing and she should go to the Emergency Room to be treated this afternoon.  Patient understands and verbalizes that she will go to the Emergency room.

## 2017-04-21 ENCOUNTER — Telehealth (HOSPITAL_COMMUNITY): Payer: Self-pay | Admitting: *Deleted

## 2017-04-21 ENCOUNTER — Ambulatory Visit (HOSPITAL_COMMUNITY)
Admission: RE | Admit: 2017-04-21 | Discharge: 2017-04-21 | Disposition: A | Discharge status: Home / Self Care | Payer: BLUE CROSS/BLUE SHIELD | Source: Ambulatory Visit | Attending: Cardiology | Admitting: Cardiology

## 2017-04-21 ENCOUNTER — Telehealth: Payer: Self-pay | Admitting: Cardiology

## 2017-04-21 ENCOUNTER — Encounter (HOSPITAL_COMMUNITY): Payer: Self-pay

## 2017-04-21 VITALS — BP 164/102 | HR 98 | Wt 183.4 lb

## 2017-04-21 DIAGNOSIS — I10 Essential (primary) hypertension: Secondary | ICD-10-CM

## 2017-04-21 DIAGNOSIS — R5383 Other fatigue: Secondary | ICD-10-CM | POA: Diagnosis not present

## 2017-04-21 DIAGNOSIS — Z794 Long term (current) use of insulin: Secondary | ICD-10-CM | POA: Insufficient documentation

## 2017-04-21 DIAGNOSIS — Z7982 Long term (current) use of aspirin: Secondary | ICD-10-CM | POA: Diagnosis not present

## 2017-04-21 DIAGNOSIS — G4733 Obstructive sleep apnea (adult) (pediatric): Secondary | ICD-10-CM | POA: Diagnosis not present

## 2017-04-21 DIAGNOSIS — I5022 Chronic systolic (congestive) heart failure: Secondary | ICD-10-CM

## 2017-04-21 DIAGNOSIS — I429 Cardiomyopathy, unspecified: Secondary | ICD-10-CM | POA: Diagnosis not present

## 2017-04-21 DIAGNOSIS — Z833 Family history of diabetes mellitus: Secondary | ICD-10-CM | POA: Diagnosis not present

## 2017-04-21 DIAGNOSIS — I110 Hypertensive heart disease with heart failure: Secondary | ICD-10-CM | POA: Insufficient documentation

## 2017-04-21 DIAGNOSIS — Z8249 Family history of ischemic heart disease and other diseases of the circulatory system: Secondary | ICD-10-CM | POA: Insufficient documentation

## 2017-04-21 DIAGNOSIS — M329 Systemic lupus erythematosus, unspecified: Secondary | ICD-10-CM | POA: Diagnosis not present

## 2017-04-21 DIAGNOSIS — Z79899 Other long term (current) drug therapy: Secondary | ICD-10-CM | POA: Diagnosis not present

## 2017-04-21 LAB — BASIC METABOLIC PANEL
Anion gap: 9 (ref 5–15)
BUN: 10 mg/dL (ref 6–20)
CO2: 31 mmol/L (ref 22–32)
Calcium: 8.9 mg/dL (ref 8.9–10.3)
Chloride: 100 mmol/L — ABNORMAL LOW (ref 101–111)
Creatinine, Ser: 0.85 mg/dL (ref 0.44–1.00)
GFR calc Af Amer: >60 mL/min (ref >60)
GFR calc non Af Amer: >60 mL/min (ref >60)
Glucose, Bld: 89 mg/dL (ref 65–99)
Potassium: 2.9 mmol/L — ABNORMAL LOW (ref 3.5–5.1)
Sodium: 140 mmol/L (ref 135–145)

## 2017-04-21 LAB — BRAIN NATRIURETIC PEPTIDE: B Natriuretic Peptide: 76.9 pg/mL (ref 0.0–100.0)

## 2017-04-21 MED ORDER — TORSEMIDE 20 MG PO TABS: 20.0000 mg | ORAL_TABLET | Freq: Two times a day (BID) | ORAL | 6 refills | Status: DC

## 2017-04-21 MED ORDER — HYDRALAZINE HCL 50 MG PO TABS: 50.0000 mg | ORAL_TABLET | Freq: Three times a day (TID) | ORAL | 5 refills | Status: DC

## 2017-04-21 NOTE — Patient Instructions (Addendum)
Routine lab work today. Will notify you of abnormal results, otherwise no news is good news!  INCREASE Hydralazine to 50 mg three times daily. Can "double up" on current 25 mg tablets at home (Take 2 tabs once every 8 hours). New Rx has been sent to your pharmacy for 50 mg tablets (Take 1 tab every 8 hours).  Take Torsemide 40 mg (2 tabs) once this afternoon and once tomorrow morning. Then increase daily dose starting tomorrow afternoon to 20 mg (1 tab) twice daily.  Return in 1-2 weeks for lab work.  _________________________________________________________  _________________________________________________________  Follow up 3-4 weeks with Dr. Haroldine Laws.  _________________________________________________________  _________________________________________________________  Do the following things EVERYDAY: 1) Weigh yourself in the morning before breakfast. Write it down and keep it in a log. 2) Take your medicines as prescribed 3) Eat low salt foods-Limit salt (sodium) to 2000 mg per day.  4) Stay as active as you can everyday 5) Limit all fluids for the day to less than 2 liters

## 2017-04-21 NOTE — Progress Notes (Signed)
Advanced Heart Failure Clinic Note    Primary Cardiologist:  Dr Johnsie Cancel  Rheumatologist: Dr Eliberto Ivory   HPI: Christine Mcdowell is a 46 y.o. 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   Cardiac MR in 02/2015 showed EF 46%. No LGE.  2D ECHO 12/2015 showed mild LVH, EF 35-40%, diff HK, normal diastolic function, mild MR, trivial pericardial effusion.  Admitted in October 2017 with increased dyspnea and volume overload. Diuresed with IV lasix and transitioned to lasix 40 mg twice a day. LHC/RHC normal filling pressures, EF ~20%, and normal coronaries. Discharge weight 184 pounds.   She presents today for regular follow up. Last visit added bidil but had to reduce to hydralazine alone with severe headaches. She is down 6 lbs from last visit.  Seen in ED 4/18 with edema and worsening SOB. Lasix changed to torsemide. She states her urine output isn't what she would expect. Even on diuretics only unrianting 1-2 times daily.  Dr. Eliberto Ivory states it is not lupus because edema is in her feet only. SOB walking from underground parking lot to reception with SOB. Occasionally SOB with changing clothes or bathing. Prednisone is back down to 20 mg daily. Weight at home 189-193 usually.   CPX 10/2016 Peak VO2: 18.5 (82% predicted peak VO2) VE/VCO2 slope: 30 OUES: 1.75 Peak RER: 1.14.  RHC/LHC 10/11/2016  RA = 4 RV = 24/5 PA = 23/4 (16) PCW = 9 Fick cardiac output/index = 4.4/2.2 SVR = 1580 PVR =  1.5 WU Ao sat = 95% PA sat = 58%, 62% Assessment: 1. Normal coronary arteries with separate ostial for LAD and LCX 2. Normal right heart pressures 3. NICM with EF ~20% by echo  Review of systems complete and found to be negative unless listed in HPI.    SH:  Social History   Social History  . Marital status:  Divorced    Spouse name: N/A  . Number of children: N/A  . Years of education: N/A   Occupational History  . Not on file.   Social History Main Topics  . Smoking status: Never Smoker  . Smokeless tobacco: Never Used  . Alcohol use No  . Drug use: No  . Sexual activity: Not Currently   Other Topics Concern  . Not on file   Social History Narrative  . No narrative on file    FH:  Family History  Problem Relation Age of Onset  . Heart attack Father   . Diabetes Other   . Hypertension Other   . Heart attack Paternal Grandmother     Past Medical History:  Diagnosis Date  . Anemia   . Anginal pain ([place])   . Asthma   . Cervical cancer ([place])   . CHF (congestive heart failure) ([place])   . Diabetes mellitus without complication ([place])    steroid induced  . Discoid lupus   . Fibromyalgia   . History of blood transfusion "several"   "related to anemia; had some w/hysterectomy also"  . Hx of cardiovascular stress test    ETT-Myoview (9/15):  No ischemia, EF 52%; NORMAL  . Hx of echocardiogram    Echo (9/15):  EF 50-55%, ant HK, Gr 1 DD, mild MR, mild LAE, no effusion  . Hypertension   . Iron deficiency anemia    h/o iron transfusions  .  Lupus (systemic lupus erythematosus) ([place])   . Migraine    "a few/year" (07/03/2016)  . Pneumonia 12/2015  . RA (rheumatoid arthritis) ([place])    "all over" (07/03/2016)  . Sickle cell trait ([place])   . Stroke ([place]) 2014 X 1; 2015 X 2; 2016 X 1;    "right side of face more relaxed than the other; rare speech hesitation" (07/03/2016)    Current Outpatient Prescriptions  Medication Sig Dispense Refill  . ACTEMRA 162 MG/0.9ML SOSY Inject 162 mg into the skin every 14 (fourteen) days. Thursday night around midnight (0000)    . albuterol (ACCUNEB) 1.25 MG/3ML nebulizer solution Take 3 mLs (1.25 mg total) by nebulization every 6 (six) hours as needed for wheezing. 75 mL 12  . albuterol (PROVENTIL HFA;VENTOLIN HFA) 108 (90 Base) MCG/ACT inhaler  Inhale 2 puffs into the lungs daily as needed for shortness of breath.    Marland Kitchen aspirin 81 MG chewable tablet Chew 162 mg by mouth every morning.     Marland Kitchen BREO ELLIPTA 100-25 MCG/INH AEPB Inhale 1 puff into the lungs daily.  2  . carvedilol (COREG) 3.125 MG tablet TAKE 1 TABLET(3.125 MG) BY MOUTH TWICE DAILY WITH A MEAL 30 tablet 6  . clonazePAM (KLONOPIN) 0.5 MG tablet Take 1 tablet (0.5 mg total) by mouth 3 (three) times daily as needed (Anxiety). 45 tablet 0  . DIGOX 125 MCG tablet TAKE 1 TABLET(0.125 MG) BY MOUTH DAILY 90 tablet 3  . diphenhydrAMINE (BENADRYL) 25 MG tablet Take 75 mg by mouth 3 (three) times daily.     . folic acid (FOLVITE) 1 MG tablet TAKE 1 TABLET BY MOUTH EVERY MORNING 30 tablet 0  . hydrALAZINE (APRESOLINE) 25 MG tablet Take 1 tablet (25 mg total) by mouth 3 (three) times daily. 90 tablet 5  . hydroxychloroquine (PLAQUENIL) 200 MG tablet Take 200 mg by mouth 2 (two) times daily.    . insulin aspart (NOVOLOG) 100 UNIT/ML injection Inject 0-5 Units into the skin daily as needed for high blood sugar (over 150).    . meloxicam (MOBIC) 15 MG tablet Take 15 mg by mouth daily.     . methotrexate (RHEUMATREX) 2.5 MG tablet Take 20 mg by mouth once a week. Thursdays  3  . Multiple Vitamin (MULTIVITAMIN WITH MINERALS) TABS Take 1 tablet by mouth every morning.     . ondansetron (ZOFRAN) 4 MG tablet Take 1 tablet (4 mg total) by mouth every 8 (eight) hours as needed for nausea or vomiting. 20 tablet 0  . potassium chloride SA (K-DUR,KLOR-CON) 20 MEQ tablet Take 3 tablets (60 mEq total) by mouth daily. 270 tablet 3  . predniSONE (DELTASONE) 20 MG tablet Take 40 mg by mouth daily with breakfast.    . sacubitril-valsartan (ENTRESTO) 97-103 MG Take 1 tablet by mouth 2 (two) times daily. 60 tablet 3  . spironolactone (ALDACTONE) 25 MG tablet Take 0.5 tablets (12.5 mg total) by mouth daily. 30 tablet 3  . torsemide (DEMADEX) 20 MG tablet Take 1 tablet (20 mg total) by mouth daily. 240 tablet 0    . traMADol (ULTRAM) 50 MG tablet Take 50 mg by mouth every 6 (six) hours as needed for pain.  0  . metolazone (ZAROXOLYN) 2.5 MG tablet Take 1 tablet (2.5 mg total) by mouth as directed. (Patient not taking: Reported on 01/16/2017) 5 tablet 1   No current facility-administered medications for this encounter.     Vitals:   04/21/17 1454  BP: (!) 164/102  Pulse:  98  SpO2: 99%  Weight: 183 lb 6.4 oz (83.2 kg)   Wt Readings from Last 3 Encounters:  04/21/17 183 lb 6.4 oz (83.2 kg)  04/09/17 193 lb 12.8 oz (87.9 kg)  02/18/17 185 lb (83.9 kg)     PHYSICAL EXAM: General: Well appearing. No resp difficulty. HEENT: normal Neck: supple. JVD 5-6. Carotids 2+ bilat; no bruits. No thyromegaly or nodule noted. Cor: PMI nondisplaced. RRR, No M/G/R noted Lungs: CTAB, normal effort. Abdomen: soft, non-tender, distended, no HSM. No bruits or masses. +BS  Extremities: no cyanosis, clubbing, rash, Trace to 1 + edema. No pitting.   Neuro: alert & orientedx3, cranial nerves grossly intact. moves all 4 extremities w/o difficulty. Affect flat   ASSESSMENT & PLAN: 1. Chronic Systolic Heart Failure - NICM Cath 10/11/16 with normal cors PCWP 9  --Cardiac MRI in 02/2015 showed EF 46%. No LGE.  --2D ECHO 12/2015 showed mild LVH, EF 35-40%, diff HK, normal diastolic function, mild MR, trivial pericardial effusion. --Echo 10/11/2016  EF 20-25% --Overall improved. NYHA III. Volume status difficult appears mildly overloaded at most.  --CPX with very mild HF limitation and no high-risk features - Increase torsemide to 20 mg BID (was previously on lasix 80 mg BID). Take 40 mg BID x 2 doses.  - Continue Spiro 12.5 mg daily.  - Continue Carvedilol 3.125 mg bid.  - Continue entresto 97/103 mg BID.  - Increase hydralazine 50 mg TID.  -Continue digoxin 0.125 daily - BMET and BNP today.  - Needs to see EP for ICD. Has been referred.  2.  Lupus- Per Dr Eliberto Ivory.  - Think her swelling is likely multifactorial.   3. HTN-  - Elevated today in setting of not taking medications yet.  - Meds as above.   4. Day Time Fatigue/OSA - CPAP titration scheduled for next month.   Shirley Friar, PA-C  3:17 PM  Greater than 50% of the 25 minute visit was spent in counseling/coordination of care regarding disease state education, sliding scale diuretics, and salt/fluid restriction.

## 2017-04-21 NOTE — Progress Notes (Addendum)
Entresto 97-103 mg BID PA approved by [employer] through 12/22/38. [place] who stated that it is not going through the insurance because she has not paid her premium. She needs to call [employer] and pay her premium before they will cover her medications.   Ruta Hinds. Velva Harman, PharmD, BCPS, CPP Clinical Pharmacist Pager: [phone] Phone: [phone] 04/21/2017 3:20 PM

## 2017-04-21 NOTE — Telephone Encounter (Signed)
Pt called concerned about swelling.  She states her LE have been swelling since Jan and continue to get worse, especially her feet, she states she sometimes can not even wear shoes her feet are so swollen.  She states occ she gets CP and her BP runs 160-180/100's consistently, she states she is taking her meds, however she took her last Entresto yesterday and can not afford the refill, she was told it will cost $490.  Pt states her sister recently passed due to HF and she is very concerned about herself.  Pt was seen in ER 4/18 and Lasix was changed to Torsemide 20 mg daily, she states she is taking this but feels it is not helping at all.  Pt has not been seen in our clinic since Jan.  Sch appt for today at 2:30, pt is agreeable, will also have Doroteo Bradford, Pharm D discuss meds with pt.

## 2017-04-21 NOTE — Telephone Encounter (Signed)
Received call from pt stating that her feet were swollen and she was having occasional dizzy spells.  She says she is chronically SOB, and is not having any more dyspnea than baseline.  She denies CP, palpitations, or frank syncope.  She hasn't been home to take her BP.  Pt was seen in advanced HF clinic this afternoon; she has been quite hypertensive and they are working to adjust her heart failure regimen and optimize BP control.  Pt notes her torsemide dosing was changed to help with management of volume status.  Her labs from this afternoon showed K of 2.9, so I encouraged her to take her potassium supplementation as prescribed and to consume higher potassium foods.  Discussed indications for ED visit, including acute on chronic dyspnea, CP, syncope.  She expressed understanding.  She will call the office if she has ongoing issues that do not warrant an ED visit.

## 2017-04-22 ENCOUNTER — Telehealth (HOSPITAL_COMMUNITY): Payer: Self-pay | Admitting: Cardiology

## 2017-04-22 DIAGNOSIS — L930 Discoid lupus erythematosus: Secondary | ICD-10-CM | POA: Diagnosis not present

## 2017-04-22 DIAGNOSIS — E876 Hypokalemia: Secondary | ICD-10-CM | POA: Diagnosis not present

## 2017-04-22 DIAGNOSIS — Z8673 Personal history of transient ischemic attack (TIA), and cerebral infarction without residual deficits: Secondary | ICD-10-CM | POA: Diagnosis not present

## 2017-04-22 DIAGNOSIS — E119 Type 2 diabetes mellitus without complications: Secondary | ICD-10-CM | POA: Diagnosis not present

## 2017-04-22 DIAGNOSIS — I509 Heart failure, unspecified: Secondary | ICD-10-CM

## 2017-04-22 DIAGNOSIS — M797 Fibromyalgia: Secondary | ICD-10-CM | POA: Diagnosis not present

## 2017-04-22 DIAGNOSIS — R51 Headache: Secondary | ICD-10-CM | POA: Diagnosis not present

## 2017-04-22 DIAGNOSIS — W2209XA Striking against other stationary object, initial encounter: Secondary | ICD-10-CM | POA: Diagnosis not present

## 2017-04-22 DIAGNOSIS — Z79899 Other long term (current) drug therapy: Secondary | ICD-10-CM | POA: Diagnosis not present

## 2017-04-22 DIAGNOSIS — J45909 Unspecified asthma, uncomplicated: Secondary | ICD-10-CM | POA: Diagnosis not present

## 2017-04-22 DIAGNOSIS — Z7982 Long term (current) use of aspirin: Secondary | ICD-10-CM | POA: Diagnosis not present

## 2017-04-22 DIAGNOSIS — I110 Hypertensive heart disease with heart failure: Secondary | ICD-10-CM | POA: Diagnosis not present

## 2017-04-22 DIAGNOSIS — S060X0A Concussion without loss of consciousness, initial encounter: Secondary | ICD-10-CM | POA: Diagnosis not present

## 2017-04-22 MED ORDER — POTASSIUM CHLORIDE CRYS ER 20 MEQ PO TBCR: 40.0000 meq | EXTENDED_RELEASE_TABLET | Freq: Two times a day (BID) | ORAL | 3 refills | Status: DC

## 2017-04-22 NOTE — Telephone Encounter (Signed)
Patient aware. Patient voiced understanding, recheck labs 5/8

## 2017-04-22 NOTE — Telephone Encounter (Signed)
-----   Message from Shirley Friar, PA-C sent at 04/21/2017  4:20 PM EDT ----- Change potassium to 40 meq BID and recheck 7 days.     Legrand Como [address]" [place], PA-C 04/21/2017 4:20 PM

## 2017-04-23 ENCOUNTER — Encounter (HOSPITAL_BASED_OUTPATIENT_CLINIC_OR_DEPARTMENT_OTHER): Payer: 59

## 2017-04-23 DIAGNOSIS — R34 Anuria and oliguria: Secondary | ICD-10-CM | POA: Diagnosis not present

## 2017-04-23 DIAGNOSIS — S060X0S Concussion without loss of consciousness, sequela: Secondary | ICD-10-CM | POA: Diagnosis not present

## 2017-04-23 DIAGNOSIS — E1165 Type 2 diabetes mellitus with hyperglycemia: Secondary | ICD-10-CM | POA: Diagnosis not present

## 2017-04-23 DIAGNOSIS — I110 Hypertensive heart disease with heart failure: Secondary | ICD-10-CM | POA: Diagnosis not present

## 2017-04-30 ENCOUNTER — Inpatient Hospital Stay (HOSPITAL_COMMUNITY): Admission: RE | Admit: 2017-04-30 | Payer: BLUE CROSS/BLUE SHIELD | Source: Ambulatory Visit

## 2017-05-06 ENCOUNTER — Inpatient Hospital Stay (HOSPITAL_COMMUNITY): Admission: RE | Admit: 2017-05-06 | Payer: BLUE CROSS/BLUE SHIELD | Source: Ambulatory Visit

## 2017-05-07 ENCOUNTER — Ambulatory Visit (HOSPITAL_COMMUNITY)
Admission: RE | Admit: 2017-05-07 | Discharge: 2017-05-07 | Disposition: A | Discharge status: Home / Self Care | Payer: BLUE CROSS/BLUE SHIELD | Source: Ambulatory Visit | Attending: Internal Medicine | Admitting: Internal Medicine

## 2017-05-07 DIAGNOSIS — I509 Heart failure, unspecified: Secondary | ICD-10-CM | POA: Diagnosis not present

## 2017-05-07 LAB — BASIC METABOLIC PANEL
Anion gap: 6 (ref 5–15)
BUN: 8 mg/dL (ref 6–20)
CO2: 26 mmol/L (ref 22–32)
Calcium: 9 mg/dL (ref 8.9–10.3)
Chloride: 110 mmol/L (ref 101–111)
Creatinine, Ser: 0.79 mg/dL (ref 0.44–1.00)
GFR calc Af Amer: >60 mL/min (ref >60)
GFR calc non Af Amer: >60 mL/min (ref >60)
Glucose, Bld: 88 mg/dL (ref 65–99)
Potassium: 3.3 mmol/L — ABNORMAL LOW (ref 3.5–5.1)
Sodium: 142 mmol/L (ref 135–145)

## 2017-05-08 DIAGNOSIS — I509 Heart failure, unspecified: Secondary | ICD-10-CM | POA: Diagnosis not present

## 2017-05-08 DIAGNOSIS — I110 Hypertensive heart disease with heart failure: Secondary | ICD-10-CM | POA: Diagnosis not present

## 2017-05-08 DIAGNOSIS — R0789 Other chest pain: Secondary | ICD-10-CM | POA: Diagnosis not present

## 2017-05-08 DIAGNOSIS — J45901 Unspecified asthma with (acute) exacerbation: Secondary | ICD-10-CM | POA: Diagnosis not present

## 2017-05-08 DIAGNOSIS — Z8673 Personal history of transient ischemic attack (TIA), and cerebral infarction without residual deficits: Secondary | ICD-10-CM | POA: Diagnosis not present

## 2017-05-08 DIAGNOSIS — E119 Type 2 diabetes mellitus without complications: Secondary | ICD-10-CM | POA: Diagnosis not present

## 2017-05-08 DIAGNOSIS — Z79899 Other long term (current) drug therapy: Secondary | ICD-10-CM | POA: Diagnosis not present

## 2017-05-08 DIAGNOSIS — M797 Fibromyalgia: Secondary | ICD-10-CM | POA: Diagnosis not present

## 2017-05-08 DIAGNOSIS — R061 Stridor: Secondary | ICD-10-CM | POA: Diagnosis not present

## 2017-05-08 DIAGNOSIS — Z794 Long term (current) use of insulin: Secondary | ICD-10-CM | POA: Diagnosis not present

## 2017-05-08 DIAGNOSIS — L930 Discoid lupus erythematosus: Secondary | ICD-10-CM | POA: Diagnosis not present

## 2017-05-08 DIAGNOSIS — R0602 Shortness of breath: Secondary | ICD-10-CM | POA: Diagnosis not present

## 2017-05-08 DIAGNOSIS — Z7982 Long term (current) use of aspirin: Secondary | ICD-10-CM | POA: Diagnosis not present

## 2017-05-13 DIAGNOSIS — R52 Pain, unspecified: Secondary | ICD-10-CM | POA: Diagnosis not present

## 2017-05-13 DIAGNOSIS — M2550 Pain in unspecified joint: Secondary | ICD-10-CM | POA: Diagnosis not present

## 2017-05-13 DIAGNOSIS — G8929 Other chronic pain: Secondary | ICD-10-CM | POA: Diagnosis not present

## 2017-05-15 ENCOUNTER — Encounter (HOSPITAL_COMMUNITY): Payer: Self-pay | Admitting: Internal Medicine

## 2017-05-15 ENCOUNTER — Ambulatory Visit (HOSPITAL_COMMUNITY)
Admission: RE | Admit: 2017-05-15 | Discharge: 2017-05-15 | Disposition: A | Discharge status: Home / Self Care | Payer: BLUE CROSS/BLUE SHIELD | Source: Ambulatory Visit | Attending: Internal Medicine | Admitting: Internal Medicine

## 2017-05-15 VITALS — BP 150/94 | HR 88 | Wt 178.0 lb

## 2017-05-15 DIAGNOSIS — Z7952 Long term (current) use of systemic steroids: Secondary | ICD-10-CM | POA: Diagnosis not present

## 2017-05-15 DIAGNOSIS — R9431 Abnormal electrocardiogram [ECG] [EKG]: Secondary | ICD-10-CM | POA: Diagnosis not present

## 2017-05-15 DIAGNOSIS — Z7982 Long term (current) use of aspirin: Secondary | ICD-10-CM | POA: Diagnosis not present

## 2017-05-15 DIAGNOSIS — M797 Fibromyalgia: Secondary | ICD-10-CM | POA: Insufficient documentation

## 2017-05-15 DIAGNOSIS — D573 Sickle-cell trait: Secondary | ICD-10-CM | POA: Diagnosis not present

## 2017-05-15 DIAGNOSIS — Z8673 Personal history of transient ischemic attack (TIA), and cerebral infarction without residual deficits: Secondary | ICD-10-CM | POA: Diagnosis not present

## 2017-05-15 DIAGNOSIS — I428 Other cardiomyopathies: Secondary | ICD-10-CM | POA: Insufficient documentation

## 2017-05-15 DIAGNOSIS — M329 Systemic lupus erythematosus, unspecified: Secondary | ICD-10-CM | POA: Insufficient documentation

## 2017-05-15 DIAGNOSIS — G4733 Obstructive sleep apnea (adult) (pediatric): Secondary | ICD-10-CM | POA: Diagnosis not present

## 2017-05-15 DIAGNOSIS — E785 Hyperlipidemia, unspecified: Secondary | ICD-10-CM | POA: Insufficient documentation

## 2017-05-15 DIAGNOSIS — E119 Type 2 diabetes mellitus without complications: Secondary | ICD-10-CM | POA: Diagnosis not present

## 2017-05-15 DIAGNOSIS — Z79899 Other long term (current) drug therapy: Secondary | ICD-10-CM | POA: Insufficient documentation

## 2017-05-15 DIAGNOSIS — Z794 Long term (current) use of insulin: Secondary | ICD-10-CM | POA: Diagnosis not present

## 2017-05-15 DIAGNOSIS — M069 Rheumatoid arthritis, unspecified: Secondary | ICD-10-CM | POA: Insufficient documentation

## 2017-05-15 DIAGNOSIS — I5022 Chronic systolic (congestive) heart failure: Secondary | ICD-10-CM | POA: Insufficient documentation

## 2017-05-15 DIAGNOSIS — J45909 Unspecified asthma, uncomplicated: Secondary | ICD-10-CM | POA: Insufficient documentation

## 2017-05-15 DIAGNOSIS — R0600 Dyspnea, unspecified: Secondary | ICD-10-CM | POA: Diagnosis not present

## 2017-05-15 DIAGNOSIS — I110 Hypertensive heart disease with heart failure: Secondary | ICD-10-CM | POA: Insufficient documentation

## 2017-05-15 LAB — BASIC METABOLIC PANEL
Anion gap: 10 (ref 5–15)
BUN: 14 mg/dL (ref 6–20)
CO2: 24 mmol/L (ref 22–32)
Calcium: 9.1 mg/dL (ref 8.9–10.3)
Chloride: 104 mmol/L (ref 101–111)
Creatinine, Ser: 0.77 mg/dL (ref 0.44–1.00)
GFR calc Af Amer: >60 mL/min (ref >60)
GFR calc non Af Amer: >60 mL/min (ref >60)
Glucose, Bld: 189 mg/dL — ABNORMAL HIGH (ref 65–99)
Potassium: 3.6 mmol/L (ref 3.5–5.1)
Sodium: 138 mmol/L (ref 135–145)

## 2017-05-15 LAB — BRAIN NATRIURETIC PEPTIDE: B Natriuretic Peptide: 160.4 pg/mL — ABNORMAL HIGH (ref 0.0–100.0)

## 2017-05-15 NOTE — Progress Notes (Unsigned)
    ReDS Vest - 05/15/17 0900      ReDS Vest   MR  No   Estimated volume prior to reading Med   Fitting Posture Standing   Height Marker Tall   Ruler Value 15   Center Strip Aligned   ReDS Value 30

## 2017-05-15 NOTE — Patient Instructions (Signed)
Labs today (will call for abnormal results, otherwise no news is good news)  STOP taking Digoxin  Follow up in 4 months with Dr. Haroldine Laws.  We will contact you to schedule this.  If you have not heard from [place] by September please call us to schedule.

## 2017-05-15 NOTE — Progress Notes (Signed)
Advanced Heart Failure Clinic Note    Primary Cardiologist:  Dr Johnsie Cancel  Rheumatologist: Dr Eliberto Ivory   HPI: Christine Mcdowell is a 46 y.o. 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   Cardiac MR in 02/2015 showed EF 46%. No LGE.  2D ECHO 12/2015 showed mild LVH, EF 35-40%, diff HK, normal diastolic function, mild MR, trivial pericardial effusion.  Admitted in October 2017 with increased dyspnea and volume overload. Diuresed with IV lasix and transitioned to lasix 40 mg twice a day. LHC/RHC normal filling pressures, EF ~20%, and normal coronaries. Discharge weight 184 pounds.   Today she returns for HF follow up. Since the last visit she was evaluate at [facility] due to CP and dyspnea. Negative for PE. Given a higher dose of steroids.  Says she has been under a lot stress because her sister died 2023/04/22.  Last visit hydralazine was increased to 50 mg three times a day. Overall feeling awful. Complaining of fatigue. Says she is unable to work SOB with exertion. Plans for CPAP titration in June. Complaining of palpitations during the day and at night. Primarily at night. BP at home has been running high.  Weight at home 175-180 pounds. Taking extra torsemide for over a week. Complaining of leg fatigue.  Appetite getting better. Taking all medications.   ECHO 01/16/2017 --->EF 50%  CPX 10/2016 Peak VO2: 18.5 (82% predicted peak VO2) VE/VCO2 slope: 30 OUES: 1.75 Peak RER: 1.14.  RHC/LHC 10/11/2016  RA = 4 RV = 24/5 PA = 23/4 (16) PCW = 9 Fick cardiac output/index = 4.4/2.2 SVR = 1580 PVR =  1.5 WU Ao sat = 95% PA sat = 58%, 62% Assessment: 1. Normal coronary arteries with separate ostial for LAD and LCX 2. Normal right heart pressures 3. NICM with EF ~20% by echo  Review of systems  complete and found to be negative unless listed in HPI.    SH:  Social History   Social History  . Marital status: Divorced    Spouse name: N/A  . Number of children: N/A  . Years of education: N/A   Occupational History  . Not on file.   Social History Main Topics  . Smoking status: Never Smoker  . Smokeless tobacco: Never Used  . Alcohol use No  . Drug use: No  . Sexual activity: Not Currently   Other Topics Concern  . Not on file   Social History Narrative  . No narrative on file    FH:  Family History  Problem Relation Age of Onset  . Heart attack Father   . Diabetes Other   . Hypertension Other   . Heart attack Paternal Grandmother     Past Medical History:  Diagnosis Date  . Anemia   . Anginal pain ([place])   . Asthma   . Cervical cancer ([place])   . CHF (congestive heart failure) ([place])   . Diabetes mellitus without complication ([place])    steroid induced  . Discoid lupus   . Fibromyalgia   . History of blood transfusion "several"   "related to anemia; had some w/hysterectomy also"  . Hx of cardiovascular stress test    ETT-Myoview (9/15):  No ischemia, EF 52%; NORMAL  . Hx of echocardiogram    Echo (9/15):  EF 50-55%, ant HK, Gr 1  DD, mild MR, mild LAE, no effusion  . Hypertension   . Iron deficiency anemia    h/o iron transfusions  . Lupus (systemic lupus erythematosus) ([place])   . Migraine    "a few/year" (07/03/2016)  . Pneumonia 12/2015  . RA (rheumatoid arthritis) ([place])    "all over" (07/03/2016)  . Sickle cell trait ([place])   . Stroke ([place]) 2014 X 1; 2015 X 2; 2016 X 1;    "right side of face more relaxed than the other; rare speech hesitation" (07/03/2016)    Current Outpatient Prescriptions  Medication Sig Dispense Refill  . ACTEMRA 162 MG/0.9ML SOSY Inject 162 mg into the skin every 14 (fourteen) days. Thursday night around midnight (0000)    . albuterol (ACCUNEB) 1.25 MG/3ML nebulizer solution Take 3 mLs (1.25 mg total) by nebulization every 6  (six) hours as needed for wheezing. 75 mL 12  . albuterol (PROVENTIL HFA;VENTOLIN HFA) 108 (90 Base) MCG/ACT inhaler Inhale 2 puffs into the lungs daily as needed for shortness of breath.    Marland Kitchen aspirin 81 MG chewable tablet Chew 162 mg by mouth every morning.     Marland Kitchen BREO ELLIPTA 100-25 MCG/INH AEPB Inhale 1 puff into the lungs daily.  2  . carvedilol (COREG) 3.125 MG tablet TAKE 1 TABLET(3.125 MG) BY MOUTH TWICE DAILY WITH A MEAL 30 tablet 6  . clonazePAM (KLONOPIN) 0.5 MG tablet Take 1 tablet (0.5 mg total) by mouth 3 (three) times daily as needed (Anxiety). 45 tablet 0  . DIGOX 125 MCG tablet TAKE 1 TABLET(0.125 MG) BY MOUTH DAILY 90 tablet 3  . diphenhydrAMINE (BENADRYL) 25 MG tablet Take 75 mg by mouth 3 (three) times daily.     . folic acid (FOLVITE) 1 MG tablet TAKE 1 TABLET BY MOUTH EVERY MORNING 30 tablet 0  . hydrALAZINE (APRESOLINE) 50 MG tablet Take 1 tablet (50 mg total) by mouth 3 (three) times daily. 90 tablet 5  . hydroxychloroquine (PLAQUENIL) 200 MG tablet Take 200 mg by mouth 2 (two) times daily.    . insulin aspart (NOVOLOG) 100 UNIT/ML injection Inject 0-5 Units into the skin daily as needed for high blood sugar (over 150).    . meloxicam (MOBIC) 15 MG tablet Take 15 mg by mouth daily.     . methotrexate (RHEUMATREX) 2.5 MG tablet Take 20 mg by mouth once a week. Thursdays  3  . metolazone (ZAROXOLYN) 2.5 MG tablet Take 1 tablet (2.5 mg total) by mouth as directed. 5 tablet 1  . Multiple Vitamin (MULTIVITAMIN WITH MINERALS) TABS Take 1 tablet by mouth every morning.     . potassium chloride SA (K-DUR,KLOR-CON) 20 MEQ tablet Take 2 tablets (40 mEq total) by mouth 2 (two) times daily. 120 tablet 3  . predniSONE (DELTASONE) 20 MG tablet Take 40 mg by mouth daily with breakfast.    . sacubitril-valsartan (ENTRESTO) 97-103 MG Take 1 tablet by mouth 2 (two) times daily. 60 tablet 3  . spironolactone (ALDACTONE) 25 MG tablet Take 0.5 tablets (12.5 mg total) by mouth daily. 30 tablet 3    . torsemide (DEMADEX) 20 MG tablet Take 1 tablet (20 mg total) by mouth 2 (two) times daily. 60 tablet 6  . traMADol (ULTRAM) 50 MG tablet Take 50 mg by mouth every 6 (six) hours as needed for pain.  0  . ondansetron (ZOFRAN) 4 MG tablet Take 1 tablet (4 mg total) by mouth every 8 (eight) hours as needed for nausea or vomiting. (Patient not taking:  Reported on 05/15/2017) 20 tablet 0   No current facility-administered medications for this encounter.     Vitals:   05/15/17 0936  BP: (!) 150/94  Pulse: 88  SpO2: 100%  Weight: 178 lb (80.7 kg)   Wt Readings from Last 3 Encounters:  05/15/17 178 lb (80.7 kg)  05/15/17 178 lb (80.7 kg)  04/21/17 183 lb 6.4 oz (83.2 kg)     PHYSICAL EXAM: General:  Well appearing. No resp difficulty HEENT: normal Neck: supple. no JVD. Carotids 2+ bilat; no bruits. No lymphadenopathy or thryomegaly appreciated. Cor: PMI nondisplaced. Regular rate & rhythm. No rubs, gallops or murmurs. Lungs: clear Abdomen: soft, nontender, nondistended. No hepatosplenomegaly. No bruits or masses. Good bowel sounds. Extremities: no cyanosis, clubbing, rash, R  And LLE trace edema Neuro: alert & orientedx3, cranial nerves grossly intact. moves all 4 extremities w/o difficulty. Affect pleasant   EKG- NSR 94 BPM   ASSESSMENT & PLAN: 1. Chronic Systolic Heart Failure - NICM Cath 10/11/16 with normal cors PCWP 9  --Cardiac MRI in 02/2015 showed EF 46%. No LGE. --CPX with very mild HF limitation and no high-risk features.--2D ECHO 12/2015 showed mild LVH, EF 35-40%, diff HK, normal diastolic function, mild MR, trivial pericardial effusion. ECHO 12/2016 EF 50%.  Today Dr Vaughan Browner performed bedside ECHO. EF ~50%.  Marland Kitchen NYHA III. REDS Vest Reading 30% -Continue  torsemide to 20 mg BID. Continue Spiro 12.5 mg daily.  - Continue Carvedilol 3.125 mg bid.  - Continue entresto 97/103 mg BID.   - Increase hydralazine 50 mg TID.  - Stop dig 2.  Lupus- Per Dr Eliberto Ivory.  3. HTN-   Elevated. CPAP titration planned for next month.  4. Day Time Fatigue/OSA - CPAP planned for next month.    Follow up 4 months  Darrick Grinder, NP  9:48 AM  Patient seen and examined with Darrick Grinder, NP. We discussed all aspects of the encounter. I agree with the assessment and plan as stated above.   She has had multiple recent symptoms including recent ER visit for SOB and CP. Previous cath with normal coronary arteries. Currently no evidence of volume overload on exam. I performed bedside echo and EF stable at 50%. ReDS vest with lung fluid at 30% (normal range). I have reassured her that symptoms unlikely related to HF. Suspect more stress related. Agree with med changes as above.  Total time spent 45 minutes. Over half that time spent discussing above.   Glori Bickers, MD  8:13 PM

## 2017-05-22 DIAGNOSIS — Z7982 Long term (current) use of aspirin: Secondary | ICD-10-CM | POA: Diagnosis not present

## 2017-05-22 DIAGNOSIS — M358 Other specified systemic involvement of connective tissue: Secondary | ICD-10-CM | POA: Diagnosis not present

## 2017-05-22 DIAGNOSIS — M25561 Pain in right knee: Secondary | ICD-10-CM | POA: Diagnosis not present

## 2017-05-22 DIAGNOSIS — I428 Other cardiomyopathies: Secondary | ICD-10-CM | POA: Diagnosis not present

## 2017-05-22 DIAGNOSIS — J45909 Unspecified asthma, uncomplicated: Secondary | ICD-10-CM | POA: Diagnosis not present

## 2017-05-22 DIAGNOSIS — I110 Hypertensive heart disease with heart failure: Secondary | ICD-10-CM | POA: Diagnosis not present

## 2017-05-22 DIAGNOSIS — Z7409 Other reduced mobility: Secondary | ICD-10-CM | POA: Diagnosis not present

## 2017-05-22 DIAGNOSIS — M2550 Pain in unspecified joint: Secondary | ICD-10-CM | POA: Diagnosis not present

## 2017-05-22 DIAGNOSIS — M2560 Stiffness of unspecified joint, not elsewhere classified: Secondary | ICD-10-CM | POA: Diagnosis not present

## 2017-05-22 DIAGNOSIS — R531 Weakness: Secondary | ICD-10-CM | POA: Diagnosis not present

## 2017-05-22 DIAGNOSIS — Z7951 Long term (current) use of inhaled steroids: Secondary | ICD-10-CM | POA: Diagnosis not present

## 2017-05-22 DIAGNOSIS — I5022 Chronic systolic (congestive) heart failure: Secondary | ICD-10-CM | POA: Diagnosis not present

## 2017-05-22 DIAGNOSIS — G8929 Other chronic pain: Secondary | ICD-10-CM | POA: Diagnosis not present

## 2017-05-22 DIAGNOSIS — I7300 Raynaud's syndrome without gangrene: Secondary | ICD-10-CM | POA: Diagnosis not present

## 2017-05-22 DIAGNOSIS — M351 Other overlap syndromes: Secondary | ICD-10-CM | POA: Diagnosis not present

## 2017-05-22 DIAGNOSIS — M329 Systemic lupus erythematosus, unspecified: Secondary | ICD-10-CM | POA: Diagnosis not present

## 2017-05-22 DIAGNOSIS — E119 Type 2 diabetes mellitus without complications: Secondary | ICD-10-CM | POA: Diagnosis not present

## 2017-05-22 DIAGNOSIS — Z881 Allergy status to other antibiotic agents status: Secondary | ICD-10-CM | POA: Diagnosis not present

## 2017-05-22 DIAGNOSIS — Z79899 Other long term (current) drug therapy: Secondary | ICD-10-CM | POA: Diagnosis not present

## 2017-05-22 DIAGNOSIS — Z885 Allergy status to narcotic agent status: Secondary | ICD-10-CM | POA: Diagnosis not present

## 2017-05-22 DIAGNOSIS — M238X1 Other internal derangements of right knee: Secondary | ICD-10-CM | POA: Diagnosis not present

## 2017-05-22 DIAGNOSIS — M069 Rheumatoid arthritis, unspecified: Secondary | ICD-10-CM | POA: Diagnosis not present

## 2017-05-22 DIAGNOSIS — Z794 Long term (current) use of insulin: Secondary | ICD-10-CM | POA: Diagnosis not present

## 2017-05-22 DIAGNOSIS — M0579 Rheumatoid arthritis with rheumatoid factor of multiple sites without organ or systems involvement: Secondary | ICD-10-CM | POA: Diagnosis not present

## 2017-05-27 ENCOUNTER — Encounter (HOSPITAL_COMMUNITY): Payer: BLUE CROSS/BLUE SHIELD | Admitting: Internal Medicine

## 2017-05-29 ENCOUNTER — Ambulatory Visit: Payer: BLUE CROSS/BLUE SHIELD | Admitting: Neurology

## 2017-05-29 ENCOUNTER — Telehealth: Payer: Self-pay | Admitting: Neurology

## 2017-05-29 NOTE — Telephone Encounter (Signed)
This patient canceled same day of new patient appointment.

## 2017-05-30 ENCOUNTER — Encounter: Payer: Self-pay | Admitting: Neurology

## 2017-06-09 DIAGNOSIS — M329 Systemic lupus erythematosus, unspecified: Secondary | ICD-10-CM | POA: Diagnosis not present

## 2017-06-09 DIAGNOSIS — E119 Type 2 diabetes mellitus without complications: Secondary | ICD-10-CM | POA: Diagnosis not present

## 2017-06-09 DIAGNOSIS — I5022 Chronic systolic (congestive) heart failure: Secondary | ICD-10-CM | POA: Diagnosis not present

## 2017-06-09 DIAGNOSIS — N181 Chronic kidney disease, stage 1: Secondary | ICD-10-CM | POA: Diagnosis not present

## 2017-06-11 DIAGNOSIS — Z794 Long term (current) use of insulin: Secondary | ICD-10-CM | POA: Diagnosis not present

## 2017-06-11 DIAGNOSIS — E109 Type 1 diabetes mellitus without complications: Secondary | ICD-10-CM | POA: Diagnosis not present

## 2017-06-17 ENCOUNTER — Ambulatory Visit (HOSPITAL_BASED_OUTPATIENT_CLINIC_OR_DEPARTMENT_OTHER): Payer: BLUE CROSS/BLUE SHIELD | Attending: Cardiology | Admitting: Cardiology

## 2017-06-17 DIAGNOSIS — G4733 Obstructive sleep apnea (adult) (pediatric): Secondary | ICD-10-CM | POA: Diagnosis not present

## 2017-06-17 DIAGNOSIS — I493 Ventricular premature depolarization: Secondary | ICD-10-CM | POA: Insufficient documentation

## 2017-06-22 NOTE — Procedures (Signed)
   Patient Name: Christine Mcdowell, Christine Mcdowell Date: 06/17/2017 Gender: Female D.O.B: 1971-08-21 Age (years): 46 Referring Provider: Darrick Grinder NP Height (inches): 67 Interpreting Physician: [name] MD, ABSM Weight (lbs): 175 RPSGT: [name] BMI: 27 MRN: [id number] Neck Size: 16.00  CLINICAL INFORMATION The patient is referred for a CPAP titration to treat sleep apnea. Date of NPSG, Split Night or HST:02/18/2017  SLEEP STUDY TECHNIQUE As per the AASM Manual for the Scoring of Sleep and Associated Events v2.3 (April 2016) with a hypopnea requiring 4% desaturations.  The channels recorded and monitored were frontal, central and occipital EEG, electrooculogram (EOG), submentalis EMG (chin), nasal and oral airflow, thoracic and abdominal wall motion, anterior tibialis EMG, snore microphone, electrocardiogram, and pulse oximetry. Continuous positive airway pressure (CPAP) was initiated at the beginning of the study and titrated to treat sleep-disordered breathing.  MEDICATIONS Medications self-administered by patient taken the night of the study : HYDRALAZINE, [place] Comments added by technician: PATIENT WAS ORDERED AS A CPAP TITRATION.  Comments added by scorer: N/A  RESPIRATORY PARAMETERS Optimal PAP Pressure (cm):12  AHI at Optimal Pressure (/hr):0.0 Overall Minimal O2 (%):88.00  Supine % at Optimal Pressure (%):100 Minimal O2 at Optimal Pressure (%): 97.0    SLEEP ARCHITECTURE The study was initiated at 10:54:35 PM and ended at 4:55:02 AM.  Sleep onset time was 1.5 minutes and the sleep efficiency was 98.3%. The total sleep time was 354.5 minutes.  The patient spent 3.39% of the night in stage N1 sleep, 57.26% in stage N2 sleep, 0.00% in stage N3 and 39.35% in REM.Stage REM latency was 50.0 minutes  Wake after sleep onset was 4.5. Alpha intrusion was absent. Supine sleep was 100.00%.  CARDIAC DATA The 2 lead EKG demonstrated sinus rhythm.  The mean heart rate was 61.27 beats per minute. Other EKG findings include: PVC.  LEG MOVEMENT DATA The total Periodic Limb Movements of Sleep (PLMS) were 0. The PLMS index was 0.00. A PLMS index of <15 is considered normal in adults.  IMPRESSIONS - The optimal PAP pressure was 12 cm of water. - Central sleep apnea was not noted during this titration (CAI = 0.0/h). - Mild oxygen desaturations were observed during this titration (min O2 = 88.00%). - The patient snored with Moderate snoring volume during this titration study. - PVCs were observed during this study. - Clinically significant periodic limb movements were not noted during this study. Arousals associated with PLMs were rare.  DIAGNOSIS - Obstructive Sleep Apnea (327.23 [G47.33 ICD-10]) - PVCs  RECOMMENDATIONS - Trial of CPAP therapy on 12 cm H2O with a Small size Fisher&Paykel Full Face Mask Simplus mask and heated humidification. - Avoid alcohol, sedatives and other CNS depressants that may worsen sleep apnea and disrupt normal sleep architecture. - Sleep hygiene should be reviewed to assess factors that may improve sleep quality. - Weight management and regular exercise should be initiated or continued. - Return to Sleep Center for re-evaluation after 10 weeks of therapy  [place], [facility]  ELECTRONICALLY SIGNED ON:  06/22/2017, 12:12 AM [place] PH: [phone]   FX: [phone] [place]

## 2017-06-25 ENCOUNTER — Other Ambulatory Visit (HOSPITAL_COMMUNITY): Payer: Self-pay | Admitting: Internal Medicine

## 2017-06-26 ENCOUNTER — Telehealth: Payer: Self-pay | Admitting: *Deleted

## 2017-06-26 NOTE — Telephone Encounter (Signed)
-----   Message from Christine Margarita, MD sent at 06/22/2017 12:15 AM EDT ----- Pt had successful PAP titration. Please setup appointment in 10 weeks. Please let [facility] know that order for PAP is in EPIC.

## 2017-06-26 NOTE — Telephone Encounter (Signed)
Informed patient of titration results and verbalized understanding was indicated. Patient understands she will be contacted by [facility] to set up her cpap. She understands to call if [facility] does not contact her with new setup in a timely manner. She understands she will be called once confirmation has been received from [facility] that she has received her new machine to schedule 10 week follow up appointment.  [place] notified of new cpap order in epic Please add to Christine Mcdowell She was grateful for the call and thanked me

## 2017-07-01 ENCOUNTER — Other Ambulatory Visit (HOSPITAL_COMMUNITY): Payer: Self-pay | Admitting: Internal Medicine

## 2017-07-02 DIAGNOSIS — T148XXA Other injury of unspecified body region, initial encounter: Secondary | ICD-10-CM | POA: Diagnosis not present

## 2017-07-02 DIAGNOSIS — Z79899 Other long term (current) drug therapy: Secondary | ICD-10-CM | POA: Diagnosis not present

## 2017-07-02 DIAGNOSIS — X58XXXA Exposure to other specified factors, initial encounter: Secondary | ICD-10-CM | POA: Diagnosis not present

## 2017-07-12 ENCOUNTER — Other Ambulatory Visit (HOSPITAL_COMMUNITY): Payer: Self-pay | Admitting: Adult Health

## 2017-07-15 DIAGNOSIS — M1711 Unilateral primary osteoarthritis, right knee: Secondary | ICD-10-CM | POA: Diagnosis not present

## 2017-07-15 DIAGNOSIS — M1710 Unilateral primary osteoarthritis, unspecified knee: Secondary | ICD-10-CM | POA: Diagnosis not present

## 2017-07-15 DIAGNOSIS — M25561 Pain in right knee: Secondary | ICD-10-CM | POA: Diagnosis not present

## 2017-07-15 DIAGNOSIS — X58XXXA Exposure to other specified factors, initial encounter: Secondary | ICD-10-CM | POA: Diagnosis not present

## 2017-07-15 DIAGNOSIS — S83511A Sprain of anterior cruciate ligament of right knee, initial encounter: Secondary | ICD-10-CM | POA: Diagnosis not present

## 2017-07-24 DIAGNOSIS — M2241 Chondromalacia patellae, right knee: Secondary | ICD-10-CM | POA: Diagnosis not present

## 2017-07-24 DIAGNOSIS — X58XXXA Exposure to other specified factors, initial encounter: Secondary | ICD-10-CM | POA: Diagnosis not present

## 2017-07-24 DIAGNOSIS — S83511A Sprain of anterior cruciate ligament of right knee, initial encounter: Secondary | ICD-10-CM | POA: Diagnosis not present

## 2017-07-25 ENCOUNTER — Other Ambulatory Visit (HOSPITAL_COMMUNITY): Payer: Self-pay | Admitting: Internal Medicine

## 2017-07-28 DIAGNOSIS — Z113 Encounter for screening for infections with a predominantly sexual mode of transmission: Secondary | ICD-10-CM | POA: Diagnosis not present

## 2017-07-28 DIAGNOSIS — R87610 Atypical squamous cells of undetermined significance on cytologic smear of cervix (ASC-US): Secondary | ICD-10-CM | POA: Diagnosis not present

## 2017-07-28 DIAGNOSIS — Z0100 Encounter for examination of eyes and vision without abnormal findings: Secondary | ICD-10-CM | POA: Diagnosis not present

## 2017-07-28 DIAGNOSIS — R61 Generalized hyperhidrosis: Secondary | ICD-10-CM | POA: Diagnosis not present

## 2017-07-28 DIAGNOSIS — J309 Allergic rhinitis, unspecified: Secondary | ICD-10-CM | POA: Diagnosis not present

## 2017-07-28 DIAGNOSIS — I5042 Chronic combined systolic (congestive) and diastolic (congestive) heart failure: Secondary | ICD-10-CM | POA: Diagnosis not present

## 2017-07-28 DIAGNOSIS — Z0000 Encounter for general adult medical examination without abnormal findings: Secondary | ICD-10-CM | POA: Diagnosis not present

## 2017-07-28 DIAGNOSIS — E1165 Type 2 diabetes mellitus with hyperglycemia: Secondary | ICD-10-CM | POA: Diagnosis not present

## 2017-07-28 DIAGNOSIS — M25561 Pain in right knee: Secondary | ICD-10-CM | POA: Diagnosis not present

## 2017-07-28 DIAGNOSIS — F329 Major depressive disorder, single episode, unspecified: Secondary | ICD-10-CM | POA: Diagnosis not present

## 2017-07-28 DIAGNOSIS — M329 Systemic lupus erythematosus, unspecified: Secondary | ICD-10-CM | POA: Diagnosis not present

## 2017-07-28 DIAGNOSIS — Z124 Encounter for screening for malignant neoplasm of cervix: Secondary | ICD-10-CM | POA: Diagnosis not present

## 2017-07-30 ENCOUNTER — Encounter: Payer: Self-pay | Admitting: Physician Assistant

## 2017-07-30 ENCOUNTER — Telehealth: Payer: Self-pay | Admitting: *Deleted

## 2017-07-30 NOTE — Telephone Encounter (Addendum)
Made an attempt to contact patient via phone but number isn't working. Will send a mychart message requesting patient to call office.   While speaking with the patient yesterday around 2:45 pm the computer system was lost. Please see details from that conversation below:  Patient c/o SOB with activity, swelling and weight gain of about 8-10 lbs. Patient confirmed that she had taken 2 extra torsemides for the past 2 days per her PCP. Patient said she did not have metolazone at home and hasn't used this medication since January 2018. No c/o chest pain, dizziness. Patient has been doing her ADL's as usual.

## 2017-07-30 NOTE — Telephone Encounter (Signed)
Great afternoon,  I am having issues with fluid and shortness of breath. I've been following my medications and in the last 2 weeks my feet and hands are swollen and I'm experienced shortness of breath. No chest pains but I have that full feeling.  What do you recommend?

## 2017-07-30 NOTE — Telephone Encounter (Signed)
Follow Up ° ° °Pt calling back °

## 2017-07-31 ENCOUNTER — Encounter: Payer: Self-pay | Admitting: Adult Health

## 2017-07-31 ENCOUNTER — Ambulatory Visit (INDEPENDENT_AMBULATORY_CARE_PROVIDER_SITE_OTHER): Payer: BLUE CROSS/BLUE SHIELD | Admitting: Adult Health

## 2017-07-31 ENCOUNTER — Encounter: Payer: Self-pay | Admitting: *Deleted

## 2017-07-31 VITALS — BP 140/98 | HR 97 | Ht 67.0 in | Wt 179.2 lb

## 2017-07-31 DIAGNOSIS — R0602 Shortness of breath: Secondary | ICD-10-CM | POA: Diagnosis not present

## 2017-07-31 DIAGNOSIS — L930 Discoid lupus erythematosus: Secondary | ICD-10-CM | POA: Diagnosis not present

## 2017-07-31 DIAGNOSIS — I420 Dilated cardiomyopathy: Secondary | ICD-10-CM

## 2017-07-31 DIAGNOSIS — I10 Essential (primary) hypertension: Secondary | ICD-10-CM | POA: Diagnosis not present

## 2017-07-31 DIAGNOSIS — I5020 Unspecified systolic (congestive) heart failure: Secondary | ICD-10-CM

## 2017-07-31 MED ORDER — TORSEMIDE 20 MG PO TABS: 40.0000 mg | ORAL_TABLET | Freq: Every day | ORAL | 6 refills | Status: DC

## 2017-07-31 MED ORDER — METOLAZONE 2.5 MG PO TABS: 2.5000 mg | ORAL_TABLET | ORAL | 4 refills | Status: DC

## 2017-07-31 NOTE — Telephone Encounter (Signed)
Spoke with patient this morning and gave her an appointment to see Leonia Reader NP.

## 2017-07-31 NOTE — Patient Instructions (Signed)
Medication Instructions:   Your physician recommends that you continue on your current medications as directed. Please refer to the Current Medication list given to you today.  Change torsemide to 20 mg taking two tablets by mouth daily in the morning only.  Please pick up your refills for metolazone and torsemide.  Labwork:  NONE  Testing/Procedures:  NONE  Follow-Up:  Your physician recommends that you schedule a follow-up appointment in: 6 months with Dr. Johnsie Cancel in [place] or [place]. You will receive a reminder letter in the mail in about 4 months reminding you to call and schedule your appointment. If you don't receive this letter, please contact our office.  You have been referred to see Dr. Sung Amabile in the heart failure clinic.  You have been referred to a pulmonologist (Dr. Luan Pulling).  Any Other Special Instructions Will Be Listed Below (If Applicable).  If you need a refill on your cardiac medications before your next appointment, please call your pharmacy.

## 2017-07-31 NOTE — Progress Notes (Signed)
Cardiology Office Note   Date:  07/31/2017   ID:  Christine Mcdowell, DOB 12-21-1971, MRN [id number]  PCP:  [name], MD  Cardiologist:  Nishan/Bensimhon  Chief Complaint  Patient presents with  . Congestive Heart Failure  . Shortness of Breath      History of Present Illness: Christine Mcdowell is a 46 y.o. female who presents for ongoing assessment and management of chronic systolic heart failure, is followed by Dr. Haroldine Mcdowell in the advanced heart failure clinic. She has a history of lupus with associated myocarditis/cardiomyopathy), hypertension, hyperlipidemia, type 2 diabetes, OSA,  fibromyalgia, and 4 self-reported CVAs.  She was last seen by Dr. Haroldine Mcdowell on 12/2016. She had undergone a CPX, she also had a right left heart cath in October 2017 (see results below). At that time she was continued on torsemide 20mg  twice a day, spironolactone 12.5 mg daily carvedilol, Entresto was increased to 97 mg/103 mg twice a day, BiDil was added at one half tablet 3 times a day, digoxin was added, and she was referred to EP for ICD implantation.  She sent an email to our office on 07/30/2017 with complaints of worsening lower extremity edema and dyspnea. She denied any chest pain but she did feel fullness in her chest. She had run out of metolazone. This was free started via [facility] prescription at [facility] yesterday. She was scheduled for appointment today to evaluate her symptoms more fully.  She comes today with ongoing complaints of dyspnea on exertion. She is using her CPAP but intermittently she is having trouble getting used to it. She has been taking extra doses of torsemide when necessary as she has run out of metolazone and did not have her refill. She is being very careful on her diet to avoid salt.  She is being followed by rheumatologist and she is also being followed by [facility] for primary medical treatment. She has had recent labs drawn.  Past Medical History:    Diagnosis Date  . Anemia   . Anginal pain ([place])   . Asthma   . Cervical cancer (Christine Mcdowell)   . CHF (congestive heart failure) ([employer])   . Diabetes mellitus without complication ([place])    steroid induced  . Discoid lupus   . Fibromyalgia   . History of blood transfusion "several"   "related to anemia; had some w/hysterectomy also"  . Hx of cardiovascular stress test    ETT-Myoview (9/15):  No ischemia, EF 52%; NORMAL  . Hx of echocardiogram    Echo (9/15):  EF 50-55%, ant HK, Gr 1 DD, mild MR, mild LAE, no effusion  . Hypertension   . Iron deficiency anemia    h/o iron transfusions  . Lupus (systemic lupus erythematosus) (Christine Mcdowell)   . Migraine    "a few/year" (07/03/2016)  . Pneumonia 12/2015  . RA (rheumatoid arthritis) (Christine Mcdowell)    "all over" (07/03/2016)  . Sickle cell trait ([place])   . Stroke (Christine Mcdowell) 2014 X 1; 2015 X 2; 2016 X 1;    "right side of face more relaxed than the other; rare speech hesitation" (07/03/2016)    Past Surgical History:  Procedure Laterality Date  . ABDOMINAL HYSTERECTOMY  2009  . ABDOMINAL WOUND DEHISCENCE  2009  . CARDIAC CATHETERIZATION N/A 10/11/2016   Procedure: Right/Left Heart Cath and Coronary Angiography;  Surgeon: Christine Artist, MD;  Location: [facility];  Service: Cardiovascular;  Laterality: N/A;  . DILATION AND CURETTAGE OF UTERUS  1991  . HEMATOMA EVACUATION  2009   abdomen  . INCISE AND DRAIN ABCESS  2009 X 2   "abdomen after hysterectomy"  . KNEE ARTHROSCOPY Right 1997  . TUBAL LIGATION  1996     Current Outpatient Prescriptions  Medication Sig Dispense Refill  . ACTEMRA 162 MG/0.9ML SOSY Inject 162 mg into the skin every 14 (fourteen) days. Thursday night around midnight (0000)    . albuterol (ACCUNEB) 1.25 MG/3ML nebulizer solution Take 3 mLs (1.25 mg total) by nebulization every 6 (six) hours as needed for wheezing. 75 mL 12  . albuterol (PROVENTIL HFA;VENTOLIN HFA) 108 (90 Base) MCG/ACT inhaler Inhale 2 puffs into the lungs daily  as needed for shortness of breath.    Marland Kitchen aspirin 81 MG chewable tablet Chew 162 mg by mouth every morning.     Marland Kitchen BREO ELLIPTA 100-25 MCG/INH AEPB Inhale 1 puff into the lungs daily.  2  . carvedilol (COREG) 3.125 MG tablet TAKE 1 TABLET(3.125 MG) BY MOUTH TWICE DAILY WITH A MEAL 30 tablet 6  . clonazePAM (KLONOPIN) 0.5 MG tablet Take 1 tablet (0.5 mg total) by mouth 3 (three) times daily as needed (Anxiety). 45 tablet 0  . diphenhydrAMINE (BENADRYL) 25 MG tablet Take 75 mg by mouth 3 (three) times daily.     Marland Kitchen ENTRESTO 97-103 MG TAKE 1 TABLET BY MOUTH TWICE DAILY 60 tablet 3  . folic acid (FOLVITE) 1 MG tablet TAKE 1 TABLET BY MOUTH EVERY MORNING 30 tablet 0  . hydrALAZINE (APRESOLINE) 50 MG tablet Take 1 tablet (50 mg total) by mouth 3 (three) times daily. 90 tablet 3  . hydroxychloroquine (PLAQUENIL) 200 MG tablet Take 200 mg by mouth 2 (two) times daily.    . insulin aspart (NOVOLOG) 100 UNIT/ML injection Inject 0-5 Units into the skin daily as needed for high blood sugar (over 150).    . isosorbide mononitrate (IMDUR) 30 MG 24 hr tablet Take 1 tablet by mouth 2 (two) times daily.  5  . meloxicam (MOBIC) 15 MG tablet Take 15 mg by mouth daily.     . methotrexate (RHEUMATREX) 2.5 MG tablet Take 20 mg by mouth once a week. Thursdays  3  . montelukast (SINGULAIR) 10 MG tablet Take 1 tablet by mouth daily.  3  . Multiple Vitamin (MULTIVITAMIN WITH MINERALS) TABS Take 1 tablet by mouth every morning.     . ondansetron (ZOFRAN) 4 MG tablet Take 1 tablet (4 mg total) by mouth every 8 (eight) hours as needed for nausea or vomiting. 20 tablet 0  . PARoxetine (PAXIL) 10 MG tablet Take 1 tablet by mouth daily.  0  . potassium chloride SA (K-DUR,KLOR-CON) 20 MEQ tablet Take 2 tablets (40 mEq total) by mouth 2 (two) times daily. 120 tablet 3  . predniSONE (DELTASONE) 20 MG tablet Take 40 mg by mouth daily with breakfast.    . spironolactone (ALDACTONE) 25 MG tablet TAKE 1/2 TABLET(12.5 MG) BY MOUTH DAILY 30  tablet 0  . torsemide (DEMADEX) 20 MG tablet Take 2 tablets (40 mg total) by mouth daily. 07/31/17 direction change 60 tablet 6  . traZODone (DESYREL) 50 MG tablet Take 1 tablet by mouth at bedtime.  5  . TRESIBA FLEXTOUCH 100 UNIT/ML SOPN FlexTouch Pen Inject 10 Units into the skin daily.  0  . metolazone (ZAROXOLYN) 2.5 MG tablet Take 1 tablet (2.5 mg total) by mouth as directed. Take one tablet as needed for weight gain of 5 lbs within 3 days. 10 tablet 4   No current  facility-administered medications for this visit.     Allergies:   Hydromorphone; Iodinated diagnostic agents; Erythromycin; Latex; Other; and Mircette [desogestrel-ethinyl estradiol]    Social History:  The patient  reports that she has never smoked. She has never used smokeless tobacco. She reports that she does not drink alcohol or use drugs.   Family History:  The patient's family history includes Diabetes in her other; Heart attack in her father and paternal grandmother; Hypertension in her other.    ROS: All other systems are reviewed and negative. Unless otherwise mentioned in H&P    PHYSICAL EXAM: VS:  BP (!) 140/98   Pulse 97   Ht 5\' 7"  (1.702 m)   Wt 179 lb 3.2 oz (81.3 kg)   SpO2 99%   BMI 28.07 kg/m  , BMI Body mass index is 28.07 kg/m. GEN: Well nourished, well developed, in no acute distress  HEENT: normal  Neck: no JVD, carotid bruits, or masses Cardiac: RRR; no murmurs, rubs, or gallops,non-pitting  edema  Respiratory:  clear to auscultation bilaterally, normal work of breathing GI: soft, nontender, nondistended, + BS MS: no deformity or atrophy  Skin: warm and dry, no rash Neuro:  Strength and sensation are intact Psych: euthymic mood, full affect   Recent Labs: 10/01/2016: Magnesium 1.8 10/09/2016: TSH 0.929 04/09/2017: Hemoglobin 11.3; Platelets 216 05/15/2017: B Natriuretic Peptide 160.4; BUN 14; Creatinine, Ser 0.77; Potassium 3.6; Sodium 138     Wt Readings from Last 3 Encounters:    07/31/17 179 lb 3.2 oz (81.3 kg)  06/17/17 175 lb (79.4 kg)  05/15/17 178 lb (80.7 kg)      Other studies Reviewed: Echocardiogram 2017/02/10 Left ventricle: LVEF is approximately 50% with with mild   hypokinesis of the inferior wall; more severe hypokinesis of the   inferoseptal wall Global longitudinal strain is -14.1% Compared   to echo images from 2017, LVEF is improved. The cavity size was   severely dilated. Wall thickness was normal. - Mitral valve: There was mild regurgitation. - Left atrium: The atrium was mildly dilated. - Pericardium, extracardiac: A trivial pericardial effusion was   identified.  ASSESSMENT AND PLAN:  1.  Cardiomyopathy: Patient echocardiogram as above with normal LV function however she does have severe hypokinesis of the inferior septal wall and severely dilated chambers. The patient is followed by Dr. Haroldine Mcdowell and by Dr. Johnsie Cancel. She continues to have chronic dyspnea on exertion. The patient has run out of metolazone. She continues to have some lower extremity edema for which she takes extra doses of torsemide. She has not been seen by Advanced Heart Failure clinic since January. I'm going to refer her back for ongoing evaluation. She is given refills on metolazone. She will continue other medications to include Entresto 97/103 mg daily, carvedilol 3.125 mg twice a day.   2. Chronic Dyspnea with hx of Asthma: She does not have a pulmonologist. I have offered to send her to [facility] for pulmonology services. She wishes to stay local and therefore I have referred her to Dr. Luan Pulling. He will manage her CPAP.   3. Hypertension: On multiple medications. Currently only moderately controlled, despite Entresto,carvedilol, isosorbide, spironolactone and torsemide. May need to consider adding hydralazine to regimen. She will follow up in 3 months for ongoing management, after seeing Advanced Heart Failure clinic. I will increase spironolactone to 25 mg daily from 12.5  mg daily. Awaiting labs from PCP first.   4. OSA: She is supposed to be on CPAP. Having trouble getting  used to it. I have encouraged her to continue using it.   5, Lupus: She is being followed by rheumatologist and is on multiple medications which can cause fluid retention and hypertension. Will need to follow closely with all providers.    Current medicines are reviewed at length with the patient today.    Labs/ tests ordered today include:  Phill Myron. [place] Pugh, ANP, AACC   07/31/2017 3:40 PM    [facility] [address], [place], [place] [address] Phone: [phone]; Fax: [phone]

## 2017-07-31 NOTE — Telephone Encounter (Signed)
Made an attempt to contact patient. Will email her to call me at [phone]

## 2017-08-05 ENCOUNTER — Encounter: Payer: Self-pay | Admitting: *Deleted

## 2017-08-05 DIAGNOSIS — G4733 Obstructive sleep apnea (adult) (pediatric): Secondary | ICD-10-CM | POA: Diagnosis not present

## 2017-08-06 NOTE — Telephone Encounter (Addendum)
Patient got set-up on cpap on 07/09/17. Patient has a compliance range of 8/19-10/18. Patient has a 10 week follow up appointment for insurance compliance on 09/08/17. Patient understands she needs to keep this appointment.

## 2017-08-07 ENCOUNTER — Encounter: Payer: Self-pay | Admitting: Cardiology

## 2017-08-11 DIAGNOSIS — S83511A Sprain of anterior cruciate ligament of right knee, initial encounter: Secondary | ICD-10-CM | POA: Diagnosis not present

## 2017-08-11 DIAGNOSIS — M069 Rheumatoid arthritis, unspecified: Secondary | ICD-10-CM | POA: Diagnosis not present

## 2017-08-11 DIAGNOSIS — G4730 Sleep apnea, unspecified: Secondary | ICD-10-CM | POA: Diagnosis not present

## 2017-08-11 DIAGNOSIS — M94261 Chondromalacia, right knee: Secondary | ICD-10-CM | POA: Diagnosis not present

## 2017-08-11 DIAGNOSIS — G8929 Other chronic pain: Secondary | ICD-10-CM | POA: Insufficient documentation

## 2017-08-11 DIAGNOSIS — R609 Edema, unspecified: Secondary | ICD-10-CM | POA: Diagnosis not present

## 2017-08-11 DIAGNOSIS — M349 Systemic sclerosis, unspecified: Secondary | ICD-10-CM | POA: Diagnosis not present

## 2017-08-12 ENCOUNTER — Encounter: Payer: Self-pay | Admitting: Adult Health

## 2017-08-12 ENCOUNTER — Telehealth (HOSPITAL_COMMUNITY): Payer: Self-pay

## 2017-08-12 NOTE — Telephone Encounter (Signed)
Patient called CHF clinic to report HA and increased BP this am, however recheck after meds 150/90s which seems to be around her baseline per previous vital reports in CHF clinic. Also reports BLEE and SOB.  Has appointment with Dr. Kyla Balzarine midlevel tomorrow, will await to hear what their office recommends, CHF scheduler [place] notified to get patient scheduled in our office as well.  Renee Pain, RN

## 2017-08-12 NOTE — Progress Notes (Signed)
Cardiology Office Note    Date:  08/13/2017   ID:  Christine Mcdowell, DOB 31-Jul-1971, MRN [id number]  PCP:  [name], MD  Cardiologist: Dr. Johnsie Cancel  CHF: Dr. Haroldine Laws Sleep apnea Dr. Radford Pax Chief Complaint  Patient presents with  . Follow-up    History of Present Illness:  Christine Mcdowell is a 46 y.o. female chronic systolic heart failure, is followed by Dr. Haroldine Laws in the advanced heart failure clinic. She has a history of lupus with associated myocarditis/cardiomyopathy), hypertension, hyperlipidemia, type 2 diabetes, OSA,  fibromyalgia, and 4 self-reported CVAs.Last 2-D echo 12/2016 LVEF 50% previously it was 20% October 2017.   She was last seen by Dr. Haroldine Laws on 12/2016. She had undergone a CPX, she also had a right left heart cath in October 2017.  Was seen by Jory Sims, NP 07/31/17 with worsening edema and dyspnea after running out of metolazone. Metolazone was refilled and referral back to the heart failure clinic was made.  Patient had on my schedule because she called in with elevated blood pressures and headache on Sunday when she woke up.. She said her blood pressure was 170s/ 90s and she usually doesn't have elevated blood pressures or headaches. She also has more swelling and dyspnea on exertion. Her weight is the same. She took metolazone on Sunday.     Past Medical History:  Diagnosis Date  . Anemia   . Anginal pain ([place])   . Asthma   . Cervical cancer ([place])   . CHF (congestive heart failure) ([place])   . Diabetes mellitus without complication ([place])    steroid induced  . Discoid lupus   . Fibromyalgia   . History of blood transfusion "several"   "related to anemia; had some w/hysterectomy also"  . Hx of cardiovascular stress test    ETT-Myoview (9/15):  No ischemia, EF 52%; NORMAL  . Hx of echocardiogram    Echo (9/15):  EF 50-55%, ant HK, Gr 1 DD, mild MR, mild LAE, no effusion  . Hypertension   . Iron deficiency anemia    h/o iron  transfusions  . Lupus (systemic lupus erythematosus) ([place])   . Migraine    "a few/year" (07/03/2016)  . Pneumonia 12/2015  . RA (rheumatoid arthritis) ([place])    "all over" (07/03/2016)  . Sickle cell trait ([place])   . Stroke ([place]) 2014 X 1; 2015 X 2; 2016 X 1;    "right side of face more relaxed than the other; rare speech hesitation" (07/03/2016)    Past Surgical History:  Procedure Laterality Date  . ABDOMINAL HYSTERECTOMY  2009  . ABDOMINAL WOUND DEHISCENCE  2009  . CARDIAC CATHETERIZATION N/A 10/11/2016   Procedure: Right/Left Heart Cath and Coronary Angiography;  Surgeon: Jolaine Artist, MD;  Location: [facility];  Service: Cardiovascular;  Laterality: N/A;  . DILATION AND CURETTAGE OF UTERUS  1991  . HEMATOMA EVACUATION  2009   abdomen  . INCISE AND DRAIN ABCESS  2009 X 2   "abdomen after hysterectomy"  . KNEE ARTHROSCOPY Right 1997  . TUBAL LIGATION  1996    Current Medications: No outpatient prescriptions have been marked as taking for the 08/13/17 encounter (Office Visit) with Imogene Burn, PA-C.     Allergies:   Hydromorphone; Iodinated diagnostic agents; Erythromycin; Latex; Other; and Mircette [desogestrel-ethinyl estradiol]   Social History   Social History  . Marital status: Divorced    Spouse name: N/A  . Number of children: N/A  . Years of  education: N/A   Social History Main Topics  . Smoking status: Never Smoker  . Smokeless tobacco: Never Used  . Alcohol use No  . Drug use: No  . Sexual activity: Not Currently   Other Topics Concern  . None   Social History Narrative  . None     Family History:  The patient's   family history includes Diabetes in her other; Heart attack in her father and paternal grandmother; Hypertension in her other.   ROS:   Please see the history of present illness.    Review of Systems  Constitution: Positive for weakness and malaise/fatigue.  HENT: Negative.   Eyes: Negative.   Cardiovascular: Positive  for dyspnea on exertion and leg swelling.  Respiratory: Negative.   Hematologic/Lymphatic: Negative.   Musculoskeletal: Negative.  Negative for joint pain.  Gastrointestinal: Negative.   Genitourinary: Negative.   Neurological: Positive for headaches.   All other systems reviewed and are negative.   PHYSICAL EXAM:   VS:  BP 140/78 (BP Location: Left Arm)   Pulse 99   Ht 5\' 7"  (1.702 m)   Wt 179 lb (81.2 kg)   SpO2 97%   BMI 28.04 kg/m   Physical Exam  GEN: Well nourished, well developed, in no acute distress  Neck: no JVD, carotid bruits, or masses Cardiac:RRR; no murmurs, rubs, or gallops  Respiratory:  clear to auscultation bilaterally, normal work of breathing GI: soft, nontender, nondistended, + BS Ext: +1-2 edema  feet and ankles Neuro:  Alert and Oriented x 3 Psych: euthymic mood, full affect  Wt Readings from Last 3 Encounters:  08/13/17 179 lb (81.2 kg)  07/31/17 179 lb 3.2 oz (81.3 kg)  06/17/17 175 lb (79.4 kg)      Studies/Labs Reviewed:   EKG:  EKG is  ordered today.  The ekg ordered today demonstrates Normal sinus rhythm with nonspecific ST-T wave changes, no acute change  Recent Labs: 10/01/2016: Magnesium 1.8 10/09/2016: TSH 0.929 04/09/2017: Hemoglobin 11.3; Platelets 216 05/15/2017: B Natriuretic Peptide 160.4; BUN 14; Creatinine, Ser 0.77; Potassium 3.6; Sodium 138   Lipid Panel No results found for: CHOL, TRIG, HDL, CHOLHDL, VLDL, LDLCALC, LDLDIRECT  Additional studies/ records that were reviewed today include:   Echocardiogram 01/16/2017 Left ventricle: LVEF is approximately 50% with with mild   hypokinesis of the inferior wall; more severe hypokinesis of the   inferoseptal wall Global longitudinal strain is -14.1% Compared   to echo images from 2017, LVEF is improved. The cavity size was   severely dilated. Wall thickness was normal. - Mitral valve: There was mild regurgitation. - Left atrium: The atrium was mildly dilated. - Pericardium,  extracardiac: A trivial pericardial effusion was   identified.  Right and left cardiac catheterization 10/11/16 Procedures  Right/Left Heart Cath and Coronary Angiography  Conclusion  Findings:   Ao = 105/80 (91) LV = 102/11 RA = 4 RV = 24/5 PA = 23/4 (16) PCW = 9 Fick cardiac output/index = 4.4/2.2 SVR = 1580 PVR =  1.5 WU Ao sat = 95% PA sat = 58%, 62%   Assessment: 1. Normal coronary arteries with separate ostial for LAD and LCX 2. Normal right heart pressures 3. NICM with EF ~20% by echo   Plan/Discussion:   Continue medical therapy. Consider repeat cMRI as tolerated    Bensimhon, Daniel,MD 2:07 PM        ASSESSMENT:    1. Chronic systolic heart failure (HCC)   2. 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  Essential hypertension      PLAN:  In order of problems listed above:  Chronic systolic CHF followed by advancement failure clinic and has an appointment 08/21/17. She does have some extra edema today. I told to take metolazone today. follow-up with the heart failure clinic next week. Patient's last echo EF was 50% January 2018. May need this repeated with recent trouble with CHF.  Dilated cardiomyopathy ejection fraction 20% at cath 09/2016% on echo January 2017. Now having more trouble. May need to repeat echo. We'll let the heart failure management.  Essential hypertension blood pressures been elevated slightly. Will increase carvedilol to 6.25 mg twice a day. Could also increase spironolactone if needed. Follow-up next week CHF clinic.    Medication Adjustments/Labs and Tests Ordered: Current medicines are reviewed at length with the patient today.  Concerns regarding medicines are outlined above.  Medication changes, Labs and Tests ordered today are listed in the Patient Instructions below. There are no Patient Instructions on file for this visit.   Signed, Ermalinda Barrios, PA-C  08/13/2017 1:26 PM    [facility] [place],  [place],   [address] Phone: [phone]; Fax: [phone]

## 2017-08-13 ENCOUNTER — Ambulatory Visit (INDEPENDENT_AMBULATORY_CARE_PROVIDER_SITE_OTHER): Payer: BLUE CROSS/BLUE SHIELD | Admitting: Physician Assistant

## 2017-08-13 ENCOUNTER — Encounter: Payer: Self-pay | Admitting: Physician Assistant

## 2017-08-13 VITALS — BP 140/78 | HR 99 | Ht 67.0 in | Wt 179.0 lb

## 2017-08-13 DIAGNOSIS — I5022 Chronic systolic (congestive) heart failure: Secondary | ICD-10-CM | POA: Diagnosis not present

## 2017-08-13 DIAGNOSIS — I10 Essential (primary) hypertension: Secondary | ICD-10-CM

## 2017-08-13 DIAGNOSIS — I420 Dilated cardiomyopathy: Secondary | ICD-10-CM | POA: Diagnosis not present

## 2017-08-13 MED ORDER — CARVEDILOL 6.25 MG PO TABS: 6.2500 mg | ORAL_TABLET | Freq: Two times a day (BID) | ORAL | 3 refills | Status: DC

## 2017-08-13 NOTE — Patient Instructions (Signed)
Medication Instructions:  Increase coreg to 6.25 mg two times daily   Labwork: none  Testing/Procedures: none  Follow-Up: Your physician recommends that you schedule a follow-up appointment: Keep appointment with Heart Failure Clinic    Any Other Special Instructions Will Be Listed Below (If Applicable).     If you need a refill on your cardiac medications before your next appointment, please call your pharmacy.

## 2017-08-14 ENCOUNTER — Other Ambulatory Visit (HOSPITAL_COMMUNITY): Payer: Self-pay | Admitting: Internal Medicine

## 2017-08-14 ENCOUNTER — Ambulatory Visit: Payer: BLUE CROSS/BLUE SHIELD | Admitting: Adult Health

## 2017-08-19 DIAGNOSIS — G4733 Obstructive sleep apnea (adult) (pediatric): Secondary | ICD-10-CM | POA: Diagnosis not present

## 2017-08-20 ENCOUNTER — Telehealth: Payer: Self-pay | Admitting: *Deleted

## 2017-08-20 NOTE — Telephone Encounter (Signed)
Informed patient of compliance results and patient understanding was verbalized. Patient understands her events are in normal range and she has good compliance. Patient understands her settings will not change. Patient was grateful for the call and thanked me.

## 2017-08-20 NOTE — Telephone Encounter (Signed)
-----   Message from Sueanne Margarita, MD sent at 08/19/2017  4:28 PM EDT ----- Good AHI and compliance.  Continue current CPAP settings.

## 2017-08-21 ENCOUNTER — Ambulatory Visit (HOSPITAL_COMMUNITY)
Admission: RE | Admit: 2017-08-21 | Discharge: 2017-08-21 | Disposition: A | Discharge status: Home / Self Care | Payer: BLUE CROSS/BLUE SHIELD | Source: Ambulatory Visit | Attending: Cardiology | Admitting: Cardiology

## 2017-08-21 VITALS — BP 156/98 | HR 96 | Wt 178.6 lb

## 2017-08-21 DIAGNOSIS — M797 Fibromyalgia: Secondary | ICD-10-CM | POA: Diagnosis not present

## 2017-08-21 DIAGNOSIS — I110 Hypertensive heart disease with heart failure: Secondary | ICD-10-CM | POA: Insufficient documentation

## 2017-08-21 DIAGNOSIS — I10 Essential (primary) hypertension: Secondary | ICD-10-CM | POA: Diagnosis not present

## 2017-08-21 DIAGNOSIS — Z7951 Long term (current) use of inhaled steroids: Secondary | ICD-10-CM | POA: Insufficient documentation

## 2017-08-21 DIAGNOSIS — Z8541 Personal history of malignant neoplasm of cervix uteri: Secondary | ICD-10-CM | POA: Insufficient documentation

## 2017-08-21 DIAGNOSIS — M069 Rheumatoid arthritis, unspecified: Secondary | ICD-10-CM | POA: Diagnosis not present

## 2017-08-21 DIAGNOSIS — G4733 Obstructive sleep apnea (adult) (pediatric): Secondary | ICD-10-CM | POA: Insufficient documentation

## 2017-08-21 DIAGNOSIS — J45909 Unspecified asthma, uncomplicated: Secondary | ICD-10-CM | POA: Insufficient documentation

## 2017-08-21 DIAGNOSIS — Z79899 Other long term (current) drug therapy: Secondary | ICD-10-CM | POA: Diagnosis not present

## 2017-08-21 DIAGNOSIS — Z833 Family history of diabetes mellitus: Secondary | ICD-10-CM | POA: Diagnosis not present

## 2017-08-21 DIAGNOSIS — E099 Drug or chemical induced diabetes mellitus without complications: Secondary | ICD-10-CM | POA: Insufficient documentation

## 2017-08-21 DIAGNOSIS — Z7982 Long term (current) use of aspirin: Secondary | ICD-10-CM | POA: Insufficient documentation

## 2017-08-21 DIAGNOSIS — Z8249 Family history of ischemic heart disease and other diseases of the circulatory system: Secondary | ICD-10-CM | POA: Diagnosis not present

## 2017-08-21 DIAGNOSIS — Z794 Long term (current) use of insulin: Secondary | ICD-10-CM | POA: Insufficient documentation

## 2017-08-21 DIAGNOSIS — J9621 Acute and chronic respiratory failure with hypoxia: Secondary | ICD-10-CM

## 2017-08-21 DIAGNOSIS — I5022 Chronic systolic (congestive) heart failure: Secondary | ICD-10-CM

## 2017-08-21 DIAGNOSIS — M329 Systemic lupus erythematosus, unspecified: Secondary | ICD-10-CM | POA: Insufficient documentation

## 2017-08-21 DIAGNOSIS — T380X5A Adverse effect of glucocorticoids and synthetic analogues, initial encounter: Secondary | ICD-10-CM | POA: Diagnosis not present

## 2017-08-21 DIAGNOSIS — Z8673 Personal history of transient ischemic attack (TIA), and cerebral infarction without residual deficits: Secondary | ICD-10-CM | POA: Insufficient documentation

## 2017-08-21 DIAGNOSIS — I428 Other cardiomyopathies: Secondary | ICD-10-CM | POA: Insufficient documentation

## 2017-08-21 DIAGNOSIS — D573 Sickle-cell trait: Secondary | ICD-10-CM | POA: Insufficient documentation

## 2017-08-21 LAB — BASIC METABOLIC PANEL
Anion gap: 8 (ref 5–15)
BUN: 14 mg/dL (ref 6–20)
CO2: 26 mmol/L (ref 22–32)
Calcium: 9.1 mg/dL (ref 8.9–10.3)
Chloride: 105 mmol/L (ref 101–111)
Creatinine, Ser: 0.98 mg/dL (ref 0.44–1.00)
GFR calc Af Amer: >60 mL/min (ref >60)
GFR calc non Af Amer: >60 mL/min (ref >60)
Glucose, Bld: 90 mg/dL (ref 65–99)
Potassium: 2.9 mmol/L — ABNORMAL LOW (ref 3.5–5.1)
Sodium: 139 mmol/L (ref 135–145)

## 2017-08-21 MED ORDER — SPIRONOLACTONE 25 MG PO TABS: 25.0000 mg | ORAL_TABLET | Freq: Every day | ORAL | 6 refills | Status: DC

## 2017-08-21 MED ORDER — CARVEDILOL 6.25 MG PO TABS: 6.2500 mg | ORAL_TABLET | Freq: Two times a day (BID) | ORAL | 3 refills | Status: DC

## 2017-08-21 NOTE — Progress Notes (Signed)
Advanced Heart Failure Clinic Note    Primary Cardiologist:  Dr Johnsie Cancel  Rheumatologist: Dr Eliberto Ivory   HPI: Christine Mcdowell is a 46 y.o. 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   Cardiac MR in 02/2015 showed EF 46%. No LGE.  2D ECHO 12/2015 showed mild LVH, EF 35-40%, diff HK, normal diastolic function, mild MR, trivial pericardial effusion.  Admitted in October 2017 with increased dyspnea and volume overload. Diuresed with IV lasix and transitioned to lasix 40 mg twice a day. LHC/RHC normal filling pressures, EF ~20%, and normal coronaries. Discharge weight 184 pounds.   Today she returns for HF follow up.  Overall feeling ok. Says BP at home has been high in the morning but comes down after she takes her medications. She is preparing for knee surgery next week. Says BP has been high. Overall feeling ok. Denies SOB/PND/Orthopnea. Weight at home 180-188 pounds. Says she took metolazone over the weekend.   ECHO 01/16/2017 --->EF 50% Sever HK  CPX 10/2016 Peak VO2: 18.5 (82% predicted peak VO2) VE/VCO2 slope: 30 OUES: 1.75 Peak RER: 1.14.  RHC/LHC 10/11/2016  RA = 4 RV = 24/5 PA = 23/4 (16) PCW = 9 Fick cardiac output/index = 4.4/2.2 SVR = 1580 PVR =  1.5 WU Ao sat = 95% PA sat = 58%, 62% Assessment: 1. Normal coronary arteries with separate ostial for LAD and LCX 2. Normal right heart pressures 3. NICM with EF ~20% by echo  Review of systems complete and found to be negative unless listed in HPI.    SH:  Social History   Social History  . Marital status: Divorced    Spouse name: N/A  . Number of children: N/A  . Years of education: N/A   Occupational History  . Not on file.   Social History Main Topics  . Smoking status: Never Smoker  . Smokeless tobacco: Never Used    . Alcohol use No  . Drug use: No  . Sexual activity: Not Currently   Other Topics Concern  . Not on file   Social History Narrative  . No narrative on file    FH:  Family History  Problem Relation Age of Onset  . Heart attack Father   . Diabetes Other   . Hypertension Other   . Heart attack Paternal Grandmother     Past Medical History:  Diagnosis Date  . Anemia   . Anginal pain ([place])   . Asthma   . Cervical cancer ([place])   . CHF (congestive heart failure) ([place])   . Diabetes mellitus without complication ([place])    steroid induced  . Discoid lupus   . Fibromyalgia   . History of blood transfusion "several"   "related to anemia; had some w/hysterectomy also"  . Hx of cardiovascular stress test    ETT-Myoview (9/15):  No ischemia, EF 52%; NORMAL  . Hx of echocardiogram    Echo (9/15):  EF 50-55%, ant HK, Gr 1 DD, mild MR, mild LAE, no effusion  . Hypertension   . Iron deficiency anemia    h/o iron transfusions  . Lupus (systemic lupus erythematosus) ([place])   . Migraine    "a few/year" (07/03/2016)  . Pneumonia 12/2015  . RA (rheumatoid arthritis) ([place])    "all over" (07/03/2016)  . Sickle cell trait ([place])   .  Stroke ([place]) 2014 X 1; 2015 X 2; 2016 X 1;    "right side of face more relaxed than the other; rare speech hesitation" (07/03/2016)    Current Outpatient Prescriptions  Medication Sig Dispense Refill  . ACTEMRA 162 MG/0.9ML SOSY Inject 162 mg into the skin every 14 (fourteen) days. Thursday night around midnight (0000)    . albuterol (ACCUNEB) 1.25 MG/3ML nebulizer solution Take 3 mLs (1.25 mg total) by nebulization every 6 (six) hours as needed for wheezing. 75 mL 12  . albuterol (PROVENTIL HFA;VENTOLIN HFA) 108 (90 Base) MCG/ACT inhaler Inhale 2 puffs into the lungs daily as needed for shortness of breath.    Marland Kitchen aspirin 81 MG chewable tablet Chew 162 mg by mouth every morning.     Marland Kitchen BREO ELLIPTA 100-25 MCG/INH AEPB Inhale 1 puff into the lungs daily.  2  .  carvedilol (COREG) 6.25 MG tablet Take 1 tablet (6.25 mg total) by mouth 2 (two) times daily. 180 tablet 3  . clonazePAM (KLONOPIN) 0.5 MG tablet Take 1 tablet (0.5 mg total) by mouth 3 (three) times daily as needed (Anxiety). 45 tablet 0  . diphenhydrAMINE (BENADRYL) 25 MG tablet Take 75 mg by mouth 3 (three) times daily.     Marland Kitchen ENTRESTO 97-103 MG TAKE 1 TABLET BY MOUTH TWICE DAILY 60 tablet 3  . folic acid (FOLVITE) 1 MG tablet TAKE 1 TABLET BY MOUTH EVERY MORNING 30 tablet 0  . hydrALAZINE (APRESOLINE) 50 MG tablet Take 1 tablet (50 mg total) by mouth 3 (three) times daily. 90 tablet 3  . hydroxychloroquine (PLAQUENIL) 200 MG tablet Take 200 mg by mouth 2 (two) times daily.    . insulin aspart (NOVOLOG) 100 UNIT/ML injection Inject 0-5 Units into the skin daily as needed for high blood sugar (over 150).    . isosorbide mononitrate (IMDUR) 30 MG 24 hr tablet Take 1 tablet by mouth 2 (two) times daily.  5  . meloxicam (MOBIC) 15 MG tablet Take 15 mg by mouth daily.     . methotrexate (RHEUMATREX) 2.5 MG tablet Take 20 mg by mouth once a week. Thursdays  3  . metolazone (ZAROXOLYN) 2.5 MG tablet Take 1 tablet (2.5 mg total) by mouth as directed. Take one tablet as needed for weight gain of 5 lbs within 3 days. 10 tablet 4  . montelukast (SINGULAIR) 10 MG tablet Take 1 tablet by mouth daily.  3  . Multiple Vitamin (MULTIVITAMIN WITH MINERALS) TABS Take 1 tablet by mouth every morning.     . ondansetron (ZOFRAN) 4 MG tablet Take 1 tablet (4 mg total) by mouth every 8 (eight) hours as needed for nausea or vomiting. 20 tablet 0  . PARoxetine (PAXIL) 10 MG tablet Take 1 tablet by mouth daily.  0  . potassium chloride SA (K-DUR,KLOR-CON) 20 MEQ tablet TAKE 2 TABLETS(40 MEQ) BY MOUTH TWICE DAILY 120 tablet 0  . predniSONE (DELTASONE) 20 MG tablet Take 40 mg by mouth daily with breakfast.    . spironolactone (ALDACTONE) 25 MG tablet TAKE 1/2 TABLET(12.5 MG) BY MOUTH DAILY 30 tablet 0  . torsemide (DEMADEX)  20 MG tablet Take 2 tablets (40 mg total) by mouth daily. 07/31/17 direction change 60 tablet 6  . traZODone (DESYREL) 50 MG tablet Take 1 tablet by mouth at bedtime.  5  . TRESIBA FLEXTOUCH 100 UNIT/ML SOPN FlexTouch Pen Inject 10 Units into the skin daily.  0   No current facility-administered medications for this encounter.  Vitals:   08/21/17 1446  BP: (!) 156/98  Pulse: 96  SpO2: 99%  Weight: 178 lb 9.6 oz (81 kg)   Wt Readings from Last 3 Encounters:  08/21/17 178 lb 9.6 oz (81 kg)  08/13/17 179 lb (81.2 kg)  07/31/17 179 lb 3.2 oz (81.3 kg)     PHYSICAL EXAM: General:  Well appearing. No resp difficulty HEENT: normal Neck: supple. JVP 7-8 no JVD. Carotids 2+ bilat; no bruits. No lymphadenopathy or thryomegaly appreciated. Cor: PMI nondisplaced. Regular rate & rhythm. No rubs, gallops or murmurs. Lungs: clear Abdomen: soft, nontender, nondistended. No hepatosplenomegaly. No bruits or masses. Good bowel sounds. Extremities: no cyanosis, clubbing, rash, R and LLE trace edema. LLE knee brace.  Neuro: alert & orientedx3, cranial nerves grossly intact. moves all 4 extremities w/o difficulty. Affect pleasant    ASSESSMENT & PLAN: 1. Chronic Systolic Heart Failure - NICM Cath 10/11/16 with normal cors PCWP 9  --Cardiac MRI in 02/2015 showed EF 46%. No LGE. --CPX with very mild HF limitation and no high-risk features.--2D ECHO 12/2015 showed mild LVH, EF 35-40%, diff HK, normal diastolic function, mild MR, trivial pericardial effusion. ECHO 12/2016 EF 50%.  . -Continue  torsemide to 20 mg BID. - Increase spiro to 25 mg daily  - Increase carvedilol to 6.25 mg twice a day.   - Continue entresto 97/103 mg BID. - Continue hydralazine 50 mg three times a day.  -Check BMET today.  2.  Lupus- Per Dr Eliberto Ivory.  3. HTN- Elevated. Continue current medication and will increase spiro to 25 mg daily and carvedilol as noted above.  4. OSA- continue CPAP nightly.   I have asked her follow up  with [place] after she has knee surgery to report BP. Otherwise we will see her in 3-4 months. She will need repeat ECHO in January.    Darrick Grinder, NP  2:39 PM

## 2017-08-21 NOTE — Progress Notes (Signed)
Advanced Heart Failure Medication Review by a Pharmacist  Does the patient  feel that his/her medications are working for him/her?  yes  Has the patient been experiencing any side effects to the medications prescribed?  no  Does the patient measure his/her own blood pressure or blood glucose at home?  yes   Does the patient have any problems obtaining medications due to transportation or finances?   No - [employer] commercial   Understanding of regimen: good Understanding of indications: good Potential of compliance: good Patient understands to avoid NSAIDs. Patient understands to avoid decongestants.  Issues to address at subsequent visits: None   Pharmacist comments:  Ms. Bresee is a pleasant 46 yo F presenting with her medication bottles. She reports good compliance with her regimen but states that she was unaware that her carvedilol was increased and has continued to take her 3.125 mg BID. No other medication-related questions or concerns for me at this time.   Ruta Hinds. Velva Harman, PharmD, BCPS, CPP Clinical Pharmacist Pager: [phone] Phone: [phone] 08/21/2017 3:00 PM      Time with patient: 12 minutes Preparation and documentation time: 2 minutes Total time: 14 minutes

## 2017-08-21 NOTE — Patient Instructions (Signed)
INCREASE Spironolactone to 25 mg, one tab daily INCREASE Coreg to 6.25 mg, one tab twice a day  Your physician recommends that you schedule a follow-up appointment in: 3 months  Do the following things EVERYDAY: 1) Weigh yourself in the morning before breakfast. Write it down and keep it in a log. 2) Take your medicines as prescribed 3) Eat low salt foods-Limit salt (sodium) to 2000 mg per day.  4) Stay as active as you can everyday 5) Limit all fluids for the day to less than 2 liters

## 2017-08-22 ENCOUNTER — Telehealth (HOSPITAL_COMMUNITY): Payer: Self-pay

## 2017-08-22 MED ORDER — POTASSIUM CHLORIDE CRYS ER 20 MEQ PO TBCR: 40.0000 meq | EXTENDED_RELEASE_TABLET | Freq: Three times a day (TID) | ORAL | 6 refills | Status: DC

## 2017-08-22 NOTE — Telephone Encounter (Signed)
Basic metabolic panel  Order: [id number]  Status:  Final result Visible to patient:  Yes (MyChart) Dx:  Acute on chronic respiratory failure ...  Notes recorded by Effie Berkshire, RN on 08/22/2017 at 4:14 PM EDT Patient aware and agreeable, Rx updated to preferred pharmacy electronically as well as patient's chart. ------  Notes recorded by Darrick Grinder D, NP on 08/22/2017 at 11:10 AM EDT Please call call and increase potassium to 40 meq three times a day.

## 2017-08-26 DIAGNOSIS — Z7952 Long term (current) use of systemic steroids: Secondary | ICD-10-CM | POA: Diagnosis not present

## 2017-08-26 DIAGNOSIS — M2341 Loose body in knee, right knee: Secondary | ICD-10-CM | POA: Diagnosis not present

## 2017-08-26 DIAGNOSIS — Z791 Long term (current) use of non-steroidal anti-inflammatories (NSAID): Secondary | ICD-10-CM | POA: Diagnosis not present

## 2017-08-26 DIAGNOSIS — Z888 Allergy status to other drugs, medicaments and biological substances status: Secondary | ICD-10-CM | POA: Diagnosis not present

## 2017-08-26 DIAGNOSIS — M0579 Rheumatoid arthritis with rheumatoid factor of multiple sites without organ or systems involvement: Secondary | ICD-10-CM | POA: Diagnosis not present

## 2017-08-26 DIAGNOSIS — Z79899 Other long term (current) drug therapy: Secondary | ICD-10-CM | POA: Diagnosis not present

## 2017-08-26 DIAGNOSIS — G8929 Other chronic pain: Secondary | ICD-10-CM | POA: Diagnosis not present

## 2017-08-26 DIAGNOSIS — G8918 Other acute postprocedural pain: Secondary | ICD-10-CM | POA: Diagnosis not present

## 2017-08-26 DIAGNOSIS — I5041 Acute combined systolic (congestive) and diastolic (congestive) heart failure: Secondary | ICD-10-CM | POA: Diagnosis not present

## 2017-08-26 DIAGNOSIS — M329 Systemic lupus erythematosus, unspecified: Secondary | ICD-10-CM | POA: Diagnosis not present

## 2017-08-26 DIAGNOSIS — M94261 Chondromalacia, right knee: Secondary | ICD-10-CM | POA: Diagnosis not present

## 2017-08-26 DIAGNOSIS — Z7951 Long term (current) use of inhaled steroids: Secondary | ICD-10-CM | POA: Diagnosis not present

## 2017-08-26 DIAGNOSIS — I7300 Raynaud's syndrome without gangrene: Secondary | ICD-10-CM | POA: Diagnosis not present

## 2017-08-26 DIAGNOSIS — Z794 Long term (current) use of insulin: Secondary | ICD-10-CM | POA: Diagnosis not present

## 2017-08-26 DIAGNOSIS — Z91041 Radiographic dye allergy status: Secondary | ICD-10-CM | POA: Diagnosis not present

## 2017-08-26 DIAGNOSIS — M797 Fibromyalgia: Secondary | ICD-10-CM | POA: Diagnosis not present

## 2017-08-26 DIAGNOSIS — I110 Hypertensive heart disease with heart failure: Secondary | ICD-10-CM | POA: Diagnosis not present

## 2017-08-26 DIAGNOSIS — S83511A Sprain of anterior cruciate ligament of right knee, initial encounter: Secondary | ICD-10-CM | POA: Diagnosis not present

## 2017-08-26 DIAGNOSIS — M25561 Pain in right knee: Secondary | ICD-10-CM | POA: Diagnosis not present

## 2017-08-26 DIAGNOSIS — M2241 Chondromalacia patellae, right knee: Secondary | ICD-10-CM | POA: Diagnosis not present

## 2017-08-30 DIAGNOSIS — E785 Hyperlipidemia, unspecified: Secondary | ICD-10-CM | POA: Diagnosis not present

## 2017-08-30 DIAGNOSIS — M3219 Other organ or system involvement in systemic lupus erythematosus: Secondary | ICD-10-CM | POA: Diagnosis not present

## 2017-08-30 DIAGNOSIS — M7989 Other specified soft tissue disorders: Secondary | ICD-10-CM | POA: Diagnosis not present

## 2017-08-30 DIAGNOSIS — G4733 Obstructive sleep apnea (adult) (pediatric): Secondary | ICD-10-CM | POA: Diagnosis not present

## 2017-08-30 DIAGNOSIS — M329 Systemic lupus erythematosus, unspecified: Secondary | ICD-10-CM | POA: Diagnosis not present

## 2017-08-30 DIAGNOSIS — Z7984 Long term (current) use of oral hypoglycemic drugs: Secondary | ICD-10-CM | POA: Diagnosis not present

## 2017-08-30 DIAGNOSIS — I517 Cardiomegaly: Secondary | ICD-10-CM | POA: Diagnosis not present

## 2017-08-30 DIAGNOSIS — D682 Hereditary deficiency of other clotting factors: Secondary | ICD-10-CM | POA: Diagnosis not present

## 2017-08-30 DIAGNOSIS — Z91040 Latex allergy status: Secondary | ICD-10-CM | POA: Diagnosis not present

## 2017-08-30 DIAGNOSIS — S8001XA Contusion of right knee, initial encounter: Secondary | ICD-10-CM | POA: Diagnosis not present

## 2017-08-30 DIAGNOSIS — Z7952 Long term (current) use of systemic steroids: Secondary | ICD-10-CM | POA: Diagnosis not present

## 2017-08-30 DIAGNOSIS — D688 Other specified coagulation defects: Secondary | ICD-10-CM | POA: Diagnosis not present

## 2017-08-30 DIAGNOSIS — M25561 Pain in right knee: Secondary | ICD-10-CM | POA: Diagnosis not present

## 2017-08-30 DIAGNOSIS — M2508 Hemarthrosis, other specified site: Secondary | ICD-10-CM | POA: Diagnosis not present

## 2017-08-30 DIAGNOSIS — M797 Fibromyalgia: Secondary | ICD-10-CM | POA: Diagnosis not present

## 2017-08-30 DIAGNOSIS — I5022 Chronic systolic (congestive) heart failure: Secondary | ICD-10-CM | POA: Diagnosis not present

## 2017-08-30 DIAGNOSIS — M9689 Other intraoperative and postprocedural complications and disorders of the musculoskeletal system: Secondary | ICD-10-CM | POA: Diagnosis not present

## 2017-08-30 DIAGNOSIS — Z91041 Radiographic dye allergy status: Secondary | ICD-10-CM | POA: Diagnosis not present

## 2017-08-30 DIAGNOSIS — E118 Type 2 diabetes mellitus with unspecified complications: Secondary | ICD-10-CM | POA: Diagnosis not present

## 2017-08-30 DIAGNOSIS — Z888 Allergy status to other drugs, medicaments and biological substances status: Secondary | ICD-10-CM | POA: Diagnosis not present

## 2017-08-30 DIAGNOSIS — I110 Hypertensive heart disease with heart failure: Secondary | ICD-10-CM | POA: Diagnosis not present

## 2017-08-30 DIAGNOSIS — Z886 Allergy status to analgesic agent status: Secondary | ICD-10-CM | POA: Diagnosis not present

## 2017-08-30 DIAGNOSIS — Z885 Allergy status to narcotic agent status: Secondary | ICD-10-CM | POA: Diagnosis not present

## 2017-08-30 DIAGNOSIS — Z862 Personal history of diseases of the blood and blood-forming organs and certain disorders involving the immune mechanism: Secondary | ICD-10-CM | POA: Diagnosis not present

## 2017-08-30 DIAGNOSIS — M25061 Hemarthrosis, right knee: Secondary | ICD-10-CM | POA: Diagnosis not present

## 2017-08-30 DIAGNOSIS — M069 Rheumatoid arthritis, unspecified: Secondary | ICD-10-CM | POA: Diagnosis not present

## 2017-08-31 DIAGNOSIS — M9689 Other intraoperative and postprocedural complications and disorders of the musculoskeletal system: Secondary | ICD-10-CM | POA: Diagnosis not present

## 2017-08-31 DIAGNOSIS — Z862 Personal history of diseases of the blood and blood-forming organs and certain disorders involving the immune mechanism: Secondary | ICD-10-CM | POA: Diagnosis not present

## 2017-08-31 DIAGNOSIS — D688 Other specified coagulation defects: Secondary | ICD-10-CM | POA: Diagnosis not present

## 2017-08-31 DIAGNOSIS — M25061 Hemarthrosis, right knee: Secondary | ICD-10-CM | POA: Diagnosis not present

## 2017-08-31 DIAGNOSIS — Z7952 Long term (current) use of systemic steroids: Secondary | ICD-10-CM | POA: Diagnosis not present

## 2017-08-31 DIAGNOSIS — M3219 Other organ or system involvement in systemic lupus erythematosus: Secondary | ICD-10-CM | POA: Diagnosis not present

## 2017-08-31 DIAGNOSIS — M7989 Other specified soft tissue disorders: Secondary | ICD-10-CM | POA: Diagnosis not present

## 2017-09-01 DIAGNOSIS — G4733 Obstructive sleep apnea (adult) (pediatric): Secondary | ICD-10-CM | POA: Diagnosis not present

## 2017-09-01 DIAGNOSIS — M9689 Other intraoperative and postprocedural complications and disorders of the musculoskeletal system: Secondary | ICD-10-CM | POA: Diagnosis not present

## 2017-09-01 DIAGNOSIS — D688 Other specified coagulation defects: Secondary | ICD-10-CM | POA: Diagnosis not present

## 2017-09-01 DIAGNOSIS — M25061 Hemarthrosis, right knee: Secondary | ICD-10-CM | POA: Diagnosis not present

## 2017-09-02 DIAGNOSIS — D688 Other specified coagulation defects: Secondary | ICD-10-CM | POA: Diagnosis not present

## 2017-09-02 DIAGNOSIS — G4733 Obstructive sleep apnea (adult) (pediatric): Secondary | ICD-10-CM | POA: Diagnosis not present

## 2017-09-02 DIAGNOSIS — M25061 Hemarthrosis, right knee: Secondary | ICD-10-CM | POA: Diagnosis not present

## 2017-09-02 DIAGNOSIS — M9689 Other intraoperative and postprocedural complications and disorders of the musculoskeletal system: Secondary | ICD-10-CM | POA: Diagnosis not present

## 2017-09-03 ENCOUNTER — Encounter: Payer: Self-pay | Admitting: *Deleted

## 2017-09-03 ENCOUNTER — Other Ambulatory Visit: Payer: BLUE CROSS/BLUE SHIELD

## 2017-09-03 ENCOUNTER — Ambulatory Visit: Payer: BLUE CROSS/BLUE SHIELD | Admitting: Hematology & Oncology

## 2017-09-03 DIAGNOSIS — G4733 Obstructive sleep apnea (adult) (pediatric): Secondary | ICD-10-CM | POA: Diagnosis not present

## 2017-09-03 DIAGNOSIS — M9689 Other intraoperative and postprocedural complications and disorders of the musculoskeletal system: Secondary | ICD-10-CM | POA: Diagnosis not present

## 2017-09-03 DIAGNOSIS — I517 Cardiomegaly: Secondary | ICD-10-CM | POA: Diagnosis not present

## 2017-09-03 DIAGNOSIS — D688 Other specified coagulation defects: Secondary | ICD-10-CM | POA: Diagnosis not present

## 2017-09-03 DIAGNOSIS — M25061 Hemarthrosis, right knee: Secondary | ICD-10-CM | POA: Diagnosis not present

## 2017-09-04 DIAGNOSIS — M2500 Hemarthrosis, unspecified joint: Secondary | ICD-10-CM | POA: Diagnosis not present

## 2017-09-04 DIAGNOSIS — J45909 Unspecified asthma, uncomplicated: Secondary | ICD-10-CM | POA: Diagnosis not present

## 2017-09-04 DIAGNOSIS — M0579 Rheumatoid arthritis with rheumatoid factor of multiple sites without organ or systems involvement: Secondary | ICD-10-CM | POA: Diagnosis not present

## 2017-09-04 DIAGNOSIS — E119 Type 2 diabetes mellitus without complications: Secondary | ICD-10-CM | POA: Diagnosis not present

## 2017-09-04 DIAGNOSIS — I5022 Chronic systolic (congestive) heart failure: Secondary | ICD-10-CM | POA: Diagnosis not present

## 2017-09-04 DIAGNOSIS — I110 Hypertensive heart disease with heart failure: Secondary | ICD-10-CM | POA: Diagnosis not present

## 2017-09-04 DIAGNOSIS — D688 Other specified coagulation defects: Secondary | ICD-10-CM | POA: Diagnosis not present

## 2017-09-04 DIAGNOSIS — M358 Other specified systemic involvement of connective tissue: Secondary | ICD-10-CM | POA: Diagnosis not present

## 2017-09-04 DIAGNOSIS — M329 Systemic lupus erythematosus, unspecified: Secondary | ICD-10-CM | POA: Diagnosis not present

## 2017-09-04 DIAGNOSIS — I5033 Acute on chronic diastolic (congestive) heart failure: Secondary | ICD-10-CM | POA: Diagnosis not present

## 2017-09-04 DIAGNOSIS — J310 Chronic rhinitis: Secondary | ICD-10-CM | POA: Diagnosis not present

## 2017-09-04 DIAGNOSIS — Z4789 Encounter for other orthopedic aftercare: Secondary | ICD-10-CM | POA: Diagnosis not present

## 2017-09-04 DIAGNOSIS — I420 Dilated cardiomyopathy: Secondary | ICD-10-CM | POA: Diagnosis not present

## 2017-09-05 ENCOUNTER — Telehealth: Payer: Self-pay | Admitting: *Deleted

## 2017-09-05 DIAGNOSIS — I5033 Acute on chronic diastolic (congestive) heart failure: Secondary | ICD-10-CM | POA: Diagnosis not present

## 2017-09-05 DIAGNOSIS — M329 Systemic lupus erythematosus, unspecified: Secondary | ICD-10-CM | POA: Diagnosis not present

## 2017-09-05 DIAGNOSIS — J310 Chronic rhinitis: Secondary | ICD-10-CM | POA: Diagnosis not present

## 2017-09-05 DIAGNOSIS — I420 Dilated cardiomyopathy: Secondary | ICD-10-CM | POA: Diagnosis not present

## 2017-09-05 DIAGNOSIS — M0579 Rheumatoid arthritis with rheumatoid factor of multiple sites without organ or systems involvement: Secondary | ICD-10-CM | POA: Diagnosis not present

## 2017-09-05 DIAGNOSIS — M358 Other specified systemic involvement of connective tissue: Secondary | ICD-10-CM | POA: Diagnosis not present

## 2017-09-05 DIAGNOSIS — J45909 Unspecified asthma, uncomplicated: Secondary | ICD-10-CM | POA: Diagnosis not present

## 2017-09-05 DIAGNOSIS — G4733 Obstructive sleep apnea (adult) (pediatric): Secondary | ICD-10-CM | POA: Diagnosis not present

## 2017-09-05 DIAGNOSIS — Z4789 Encounter for other orthopedic aftercare: Secondary | ICD-10-CM | POA: Diagnosis not present

## 2017-09-05 DIAGNOSIS — M2500 Hemarthrosis, unspecified joint: Secondary | ICD-10-CM | POA: Diagnosis not present

## 2017-09-05 DIAGNOSIS — I110 Hypertensive heart disease with heart failure: Secondary | ICD-10-CM | POA: Diagnosis not present

## 2017-09-05 DIAGNOSIS — D688 Other specified coagulation defects: Secondary | ICD-10-CM | POA: Diagnosis not present

## 2017-09-05 DIAGNOSIS — I5022 Chronic systolic (congestive) heart failure: Secondary | ICD-10-CM | POA: Diagnosis not present

## 2017-09-05 DIAGNOSIS — E119 Type 2 diabetes mellitus without complications: Secondary | ICD-10-CM | POA: Diagnosis not present

## 2017-09-05 NOTE — Telephone Encounter (Signed)
Patient called today stating she went in the hospital for knee surgery on 9/4 and had to return to the hospital on 9/5 with an infection.  Patient was released from the hospitial on 9/8. Patient wanted to make [place] aware of why her compliance was down.

## 2017-09-08 ENCOUNTER — Encounter: Payer: Self-pay | Admitting: Cardiology

## 2017-09-08 ENCOUNTER — Ambulatory Visit: Payer: BLUE CROSS/BLUE SHIELD | Admitting: Cardiology

## 2017-09-10 ENCOUNTER — Other Ambulatory Visit (HOSPITAL_COMMUNITY): Payer: Self-pay | Admitting: Adult Health

## 2017-09-10 DIAGNOSIS — M358 Other specified systemic involvement of connective tissue: Secondary | ICD-10-CM | POA: Diagnosis not present

## 2017-09-10 DIAGNOSIS — M2500 Hemarthrosis, unspecified joint: Secondary | ICD-10-CM | POA: Diagnosis not present

## 2017-09-10 DIAGNOSIS — I110 Hypertensive heart disease with heart failure: Secondary | ICD-10-CM | POA: Diagnosis not present

## 2017-09-10 DIAGNOSIS — D688 Other specified coagulation defects: Secondary | ICD-10-CM | POA: Diagnosis not present

## 2017-09-10 DIAGNOSIS — J45909 Unspecified asthma, uncomplicated: Secondary | ICD-10-CM | POA: Diagnosis not present

## 2017-09-10 DIAGNOSIS — J310 Chronic rhinitis: Secondary | ICD-10-CM | POA: Diagnosis not present

## 2017-09-10 DIAGNOSIS — I5022 Chronic systolic (congestive) heart failure: Secondary | ICD-10-CM | POA: Diagnosis not present

## 2017-09-10 DIAGNOSIS — M329 Systemic lupus erythematosus, unspecified: Secondary | ICD-10-CM | POA: Diagnosis not present

## 2017-09-10 DIAGNOSIS — I5033 Acute on chronic diastolic (congestive) heart failure: Secondary | ICD-10-CM | POA: Diagnosis not present

## 2017-09-10 DIAGNOSIS — E119 Type 2 diabetes mellitus without complications: Secondary | ICD-10-CM | POA: Diagnosis not present

## 2017-09-10 DIAGNOSIS — M0579 Rheumatoid arthritis with rheumatoid factor of multiple sites without organ or systems involvement: Secondary | ICD-10-CM | POA: Diagnosis not present

## 2017-09-10 DIAGNOSIS — Z4789 Encounter for other orthopedic aftercare: Secondary | ICD-10-CM | POA: Diagnosis not present

## 2017-09-10 DIAGNOSIS — I420 Dilated cardiomyopathy: Secondary | ICD-10-CM | POA: Diagnosis not present

## 2017-09-11 DIAGNOSIS — M329 Systemic lupus erythematosus, unspecified: Secondary | ICD-10-CM | POA: Diagnosis not present

## 2017-09-11 DIAGNOSIS — M2500 Hemarthrosis, unspecified joint: Secondary | ICD-10-CM | POA: Diagnosis not present

## 2017-09-11 DIAGNOSIS — I5033 Acute on chronic diastolic (congestive) heart failure: Secondary | ICD-10-CM | POA: Diagnosis not present

## 2017-09-11 DIAGNOSIS — I110 Hypertensive heart disease with heart failure: Secondary | ICD-10-CM | POA: Diagnosis not present

## 2017-09-11 DIAGNOSIS — M0579 Rheumatoid arthritis with rheumatoid factor of multiple sites without organ or systems involvement: Secondary | ICD-10-CM | POA: Diagnosis not present

## 2017-09-11 DIAGNOSIS — I420 Dilated cardiomyopathy: Secondary | ICD-10-CM | POA: Diagnosis not present

## 2017-09-11 DIAGNOSIS — J310 Chronic rhinitis: Secondary | ICD-10-CM | POA: Diagnosis not present

## 2017-09-11 DIAGNOSIS — D688 Other specified coagulation defects: Secondary | ICD-10-CM | POA: Diagnosis not present

## 2017-09-11 DIAGNOSIS — M358 Other specified systemic involvement of connective tissue: Secondary | ICD-10-CM | POA: Diagnosis not present

## 2017-09-11 DIAGNOSIS — J45909 Unspecified asthma, uncomplicated: Secondary | ICD-10-CM | POA: Diagnosis not present

## 2017-09-11 DIAGNOSIS — E119 Type 2 diabetes mellitus without complications: Secondary | ICD-10-CM | POA: Diagnosis not present

## 2017-09-11 DIAGNOSIS — I5022 Chronic systolic (congestive) heart failure: Secondary | ICD-10-CM | POA: Diagnosis not present

## 2017-09-11 DIAGNOSIS — Z4789 Encounter for other orthopedic aftercare: Secondary | ICD-10-CM | POA: Diagnosis not present

## 2017-09-12 DIAGNOSIS — M358 Other specified systemic involvement of connective tissue: Secondary | ICD-10-CM | POA: Diagnosis not present

## 2017-09-12 DIAGNOSIS — I420 Dilated cardiomyopathy: Secondary | ICD-10-CM | POA: Diagnosis not present

## 2017-09-12 DIAGNOSIS — E119 Type 2 diabetes mellitus without complications: Secondary | ICD-10-CM | POA: Diagnosis not present

## 2017-09-12 DIAGNOSIS — I110 Hypertensive heart disease with heart failure: Secondary | ICD-10-CM | POA: Diagnosis not present

## 2017-09-12 DIAGNOSIS — M0579 Rheumatoid arthritis with rheumatoid factor of multiple sites without organ or systems involvement: Secondary | ICD-10-CM | POA: Diagnosis not present

## 2017-09-12 DIAGNOSIS — J310 Chronic rhinitis: Secondary | ICD-10-CM | POA: Diagnosis not present

## 2017-09-12 DIAGNOSIS — J45909 Unspecified asthma, uncomplicated: Secondary | ICD-10-CM | POA: Diagnosis not present

## 2017-09-12 DIAGNOSIS — Z4789 Encounter for other orthopedic aftercare: Secondary | ICD-10-CM | POA: Diagnosis not present

## 2017-09-12 DIAGNOSIS — M329 Systemic lupus erythematosus, unspecified: Secondary | ICD-10-CM | POA: Diagnosis not present

## 2017-09-12 DIAGNOSIS — D688 Other specified coagulation defects: Secondary | ICD-10-CM | POA: Diagnosis not present

## 2017-09-12 DIAGNOSIS — I5033 Acute on chronic diastolic (congestive) heart failure: Secondary | ICD-10-CM | POA: Diagnosis not present

## 2017-09-12 DIAGNOSIS — I5022 Chronic systolic (congestive) heart failure: Secondary | ICD-10-CM | POA: Diagnosis not present

## 2017-09-12 DIAGNOSIS — M2500 Hemarthrosis, unspecified joint: Secondary | ICD-10-CM | POA: Diagnosis not present

## 2017-09-13 ENCOUNTER — Other Ambulatory Visit (HOSPITAL_COMMUNITY): Payer: Self-pay | Admitting: Internal Medicine

## 2017-09-15 ENCOUNTER — Other Ambulatory Visit: Payer: Self-pay | Admitting: Adult Health

## 2017-09-16 DIAGNOSIS — I5022 Chronic systolic (congestive) heart failure: Secondary | ICD-10-CM | POA: Diagnosis not present

## 2017-09-16 DIAGNOSIS — J310 Chronic rhinitis: Secondary | ICD-10-CM | POA: Diagnosis not present

## 2017-09-16 DIAGNOSIS — M358 Other specified systemic involvement of connective tissue: Secondary | ICD-10-CM | POA: Diagnosis not present

## 2017-09-16 DIAGNOSIS — M2500 Hemarthrosis, unspecified joint: Secondary | ICD-10-CM | POA: Diagnosis not present

## 2017-09-16 DIAGNOSIS — M329 Systemic lupus erythematosus, unspecified: Secondary | ICD-10-CM | POA: Diagnosis not present

## 2017-09-16 DIAGNOSIS — D688 Other specified coagulation defects: Secondary | ICD-10-CM | POA: Diagnosis not present

## 2017-09-16 DIAGNOSIS — I5033 Acute on chronic diastolic (congestive) heart failure: Secondary | ICD-10-CM | POA: Diagnosis not present

## 2017-09-16 DIAGNOSIS — M0579 Rheumatoid arthritis with rheumatoid factor of multiple sites without organ or systems involvement: Secondary | ICD-10-CM | POA: Diagnosis not present

## 2017-09-16 DIAGNOSIS — I420 Dilated cardiomyopathy: Secondary | ICD-10-CM | POA: Diagnosis not present

## 2017-09-16 DIAGNOSIS — J45909 Unspecified asthma, uncomplicated: Secondary | ICD-10-CM | POA: Diagnosis not present

## 2017-09-16 DIAGNOSIS — Z4789 Encounter for other orthopedic aftercare: Secondary | ICD-10-CM | POA: Diagnosis not present

## 2017-09-16 DIAGNOSIS — I110 Hypertensive heart disease with heart failure: Secondary | ICD-10-CM | POA: Diagnosis not present

## 2017-09-16 DIAGNOSIS — E119 Type 2 diabetes mellitus without complications: Secondary | ICD-10-CM | POA: Diagnosis not present

## 2017-09-16 NOTE — Telephone Encounter (Signed)
This is a CHF pt 

## 2017-09-17 DIAGNOSIS — I5022 Chronic systolic (congestive) heart failure: Secondary | ICD-10-CM | POA: Diagnosis not present

## 2017-09-17 DIAGNOSIS — Z4789 Encounter for other orthopedic aftercare: Secondary | ICD-10-CM | POA: Diagnosis not present

## 2017-09-17 DIAGNOSIS — M2500 Hemarthrosis, unspecified joint: Secondary | ICD-10-CM | POA: Diagnosis not present

## 2017-09-17 DIAGNOSIS — J310 Chronic rhinitis: Secondary | ICD-10-CM | POA: Diagnosis not present

## 2017-09-17 DIAGNOSIS — I110 Hypertensive heart disease with heart failure: Secondary | ICD-10-CM | POA: Diagnosis not present

## 2017-09-17 DIAGNOSIS — M329 Systemic lupus erythematosus, unspecified: Secondary | ICD-10-CM | POA: Diagnosis not present

## 2017-09-17 DIAGNOSIS — M358 Other specified systemic involvement of connective tissue: Secondary | ICD-10-CM | POA: Diagnosis not present

## 2017-09-17 DIAGNOSIS — I5033 Acute on chronic diastolic (congestive) heart failure: Secondary | ICD-10-CM | POA: Diagnosis not present

## 2017-09-17 DIAGNOSIS — M0579 Rheumatoid arthritis with rheumatoid factor of multiple sites without organ or systems involvement: Secondary | ICD-10-CM | POA: Diagnosis not present

## 2017-09-17 DIAGNOSIS — E119 Type 2 diabetes mellitus without complications: Secondary | ICD-10-CM | POA: Diagnosis not present

## 2017-09-17 DIAGNOSIS — J45909 Unspecified asthma, uncomplicated: Secondary | ICD-10-CM | POA: Diagnosis not present

## 2017-09-17 DIAGNOSIS — D688 Other specified coagulation defects: Secondary | ICD-10-CM | POA: Diagnosis not present

## 2017-09-17 DIAGNOSIS — I420 Dilated cardiomyopathy: Secondary | ICD-10-CM | POA: Diagnosis not present

## 2017-09-18 ENCOUNTER — Other Ambulatory Visit: Payer: Self-pay

## 2017-09-18 NOTE — Telephone Encounter (Signed)
Medication refilled by CHF clinic

## 2017-09-22 ENCOUNTER — Encounter: Payer: BLUE CROSS/BLUE SHIELD | Admitting: Obstetrics & Gynecology

## 2017-09-22 ENCOUNTER — Telehealth: Payer: Self-pay

## 2017-09-22 NOTE — Telephone Encounter (Signed)
I will fax refill request to Heart Failure Clinic to determine if they want to refill patient request.Pt received Metolazone 2.5 mg #10 tabs on 07/31/17 and refilled on 08/11/17 and got 40 tabs. Office note of 08/21/17 says she is not taking.

## 2017-09-23 NOTE — Telephone Encounter (Signed)
Called [facility] in [place]- they do not have a rz on file for metolazone despite refill request fax sheet with rx number and information from [facility]. rx was later found to be deleted  Called patient to get better understanding of situation Patient reports her rx for metolazone has been deleted again by pharmacy, patient reports she does take metolazone as needed for weight gain and has need more s/p knee sx last month. Reports she is almost taking everyday, does have 5lb weight gain LE edema and increased SOB  Advised she could benefit from OV with continued metolazone usage. May need labs and change directions for metolazone order/diuretics.  Add on 10/3 @ 9 Will send to provider as Juluis Rainier

## 2017-09-24 ENCOUNTER — Encounter (HOSPITAL_COMMUNITY): Payer: Self-pay

## 2017-09-24 ENCOUNTER — Encounter: Payer: Self-pay | Admitting: *Deleted

## 2017-09-24 ENCOUNTER — Ambulatory Visit (HOSPITAL_COMMUNITY)
Admission: RE | Admit: 2017-09-24 | Discharge: 2017-09-24 | Disposition: A | Discharge status: Home / Self Care | Payer: BLUE CROSS/BLUE SHIELD | Source: Ambulatory Visit | Attending: Internal Medicine | Admitting: Internal Medicine

## 2017-09-24 VITALS — BP 156/98 | HR 98 | Wt 183.0 lb

## 2017-09-24 DIAGNOSIS — M329 Systemic lupus erythematosus, unspecified: Secondary | ICD-10-CM | POA: Diagnosis not present

## 2017-09-24 DIAGNOSIS — M797 Fibromyalgia: Secondary | ICD-10-CM | POA: Diagnosis not present

## 2017-09-24 DIAGNOSIS — G4733 Obstructive sleep apnea (adult) (pediatric): Secondary | ICD-10-CM | POA: Insufficient documentation

## 2017-09-24 DIAGNOSIS — Z8249 Family history of ischemic heart disease and other diseases of the circulatory system: Secondary | ICD-10-CM | POA: Diagnosis not present

## 2017-09-24 DIAGNOSIS — D573 Sickle-cell trait: Secondary | ICD-10-CM | POA: Insufficient documentation

## 2017-09-24 DIAGNOSIS — Z8673 Personal history of transient ischemic attack (TIA), and cerebral infarction without residual deficits: Secondary | ICD-10-CM | POA: Insufficient documentation

## 2017-09-24 DIAGNOSIS — I5020 Unspecified systolic (congestive) heart failure: Secondary | ICD-10-CM | POA: Diagnosis not present

## 2017-09-24 DIAGNOSIS — Z79899 Other long term (current) drug therapy: Secondary | ICD-10-CM | POA: Insufficient documentation

## 2017-09-24 DIAGNOSIS — G4730 Sleep apnea, unspecified: Secondary | ICD-10-CM | POA: Diagnosis not present

## 2017-09-24 DIAGNOSIS — Z791 Long term (current) use of non-steroidal anti-inflammatories (NSAID): Secondary | ICD-10-CM | POA: Diagnosis not present

## 2017-09-24 DIAGNOSIS — Z90710 Acquired absence of both cervix and uterus: Secondary | ICD-10-CM | POA: Diagnosis not present

## 2017-09-24 DIAGNOSIS — Z833 Family history of diabetes mellitus: Secondary | ICD-10-CM | POA: Insufficient documentation

## 2017-09-24 DIAGNOSIS — Z7982 Long term (current) use of aspirin: Secondary | ICD-10-CM | POA: Insufficient documentation

## 2017-09-24 DIAGNOSIS — M069 Rheumatoid arthritis, unspecified: Secondary | ICD-10-CM | POA: Insufficient documentation

## 2017-09-24 DIAGNOSIS — Z8541 Personal history of malignant neoplasm of cervix uteri: Secondary | ICD-10-CM | POA: Insufficient documentation

## 2017-09-24 DIAGNOSIS — I429 Cardiomyopathy, unspecified: Secondary | ICD-10-CM | POA: Insufficient documentation

## 2017-09-24 DIAGNOSIS — J45909 Unspecified asthma, uncomplicated: Secondary | ICD-10-CM | POA: Diagnosis not present

## 2017-09-24 DIAGNOSIS — I110 Hypertensive heart disease with heart failure: Secondary | ICD-10-CM | POA: Diagnosis not present

## 2017-09-24 DIAGNOSIS — I10 Essential (primary) hypertension: Secondary | ICD-10-CM

## 2017-09-24 DIAGNOSIS — I5022 Chronic systolic (congestive) heart failure: Secondary | ICD-10-CM | POA: Insufficient documentation

## 2017-09-24 DIAGNOSIS — Z794 Long term (current) use of insulin: Secondary | ICD-10-CM | POA: Insufficient documentation

## 2017-09-24 LAB — CBC
HCT: 31.1 % — ABNORMAL LOW (ref 36.0–46.0)
Hemoglobin: 10 g/dL — ABNORMAL LOW (ref 12.0–15.0)
MCH: 29 pg (ref 26.0–34.0)
MCHC: 32.2 g/dL (ref 30.0–36.0)
MCV: 90.1 fL (ref 78.0–100.0)
Platelets: 165 10*3/uL (ref 150–400)
RBC: 3.45 MIL/uL — ABNORMAL LOW (ref 3.87–5.11)
RDW: 16 % — ABNORMAL HIGH (ref 11.5–15.5)
WBC: 9.6 10*3/uL (ref 4.0–10.5)

## 2017-09-24 LAB — BASIC METABOLIC PANEL
Anion gap: 8 (ref 5–15)
BUN: 10 mg/dL (ref 6–20)
CO2: 24 mmol/L (ref 22–32)
Calcium: 8.7 mg/dL — ABNORMAL LOW (ref 8.9–10.3)
Chloride: 105 mmol/L (ref 101–111)
Creatinine, Ser: 0.76 mg/dL (ref 0.44–1.00)
GFR calc Af Amer: >60 mL/min (ref >60)
GFR calc non Af Amer: >60 mL/min (ref >60)
Glucose, Bld: 90 mg/dL (ref 65–99)
Potassium: 3.7 mmol/L (ref 3.5–5.1)
Sodium: 137 mmol/L (ref 135–145)

## 2017-09-24 LAB — BRAIN NATRIURETIC PEPTIDE: B Natriuretic Peptide: 83.3 pg/mL (ref 0.0–100.0)

## 2017-09-24 MED ORDER — ISOSORBIDE MONONITRATE ER 30 MG PO TB24: 30.0000 mg | ORAL_TABLET | Freq: Two times a day (BID) | ORAL | 5 refills | Status: DC

## 2017-09-24 MED ORDER — TORSEMIDE 20 MG PO TABS: 40.0000 mg | ORAL_TABLET | Freq: Two times a day (BID) | ORAL | 6 refills | Status: DC

## 2017-09-24 MED ORDER — SACUBITRIL-VALSARTAN 97-103 MG PO TABS: 1.0000 | ORAL_TABLET | Freq: Two times a day (BID) | ORAL | 3 refills | Status: DC

## 2017-09-24 MED ORDER — CARVEDILOL 6.25 MG PO TABS: 6.2500 mg | ORAL_TABLET | Freq: Two times a day (BID) | ORAL | 3 refills | Status: DC

## 2017-09-24 MED ORDER — METOLAZONE 2.5 MG PO TABS: 2.5000 mg | ORAL_TABLET | ORAL | 1 refills | Status: DC

## 2017-09-24 NOTE — Addendum Note (Signed)
Encounter addended by: Effie Berkshire, RN on: 09/24/2017  9:51 AM<BR>    Actions taken: Pharmacy for encounter modified, Order list changed

## 2017-09-24 NOTE — Patient Instructions (Signed)
Take torsemide 60 mg (3 tabs) twice daily for two days. Then take torsemide 40 mg (2 tabs) twice daily until follow up appointment.  Take metolazone 1 tab once tomorrow only.  Routine lab work today. Will notify you of abnormal results, otherwise no news is good news!  Refills sent to pharmacy for pick up.  Follow up 2 weeks with Oda Kilts PA-C.  ____________________________________________________________________ Marin Roberts Code: 8000  Take all medication as prescribed the day of your appointment. Bring all medications with you to your appointment.  Do the following things EVERYDAY: 1) Weigh yourself in the morning before breakfast. Write it down and keep it in a log. 2) Take your medicines as prescribed 3) Eat low salt foods-Limit salt (sodium) to 2000 mg per day.  4) Stay as active as you can everyday 5) Limit all fluids for the day to less than 2 liters

## 2017-09-24 NOTE — Progress Notes (Signed)
Advanced Heart Failure Clinic Note    Primary Cardiologist:  Dr Johnsie Cancel  Rheumatologist: Dr Eliberto Ivory   HPI: Christine Mcdowell is a 46 y.o. 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   Cardiac MR in 02/2015 showed EF 46%. No LGE.  2D ECHO 12/2015 showed mild LVH, EF 35-40%, diff HK, normal diastolic function, mild MR, trivial pericardial effusion.  Admitted in October 2017 with increased dyspnea and volume overload. Diuresed with IV lasix and transitioned to lasix 40 mg twice a day. LHC/RHC normal filling pressures, EF ~20%, and normal coronaries. Discharge weight 184 pounds.    Had R total knee "reconstruction" 08/26/17.   She presents today for add on due to edema and worsening SOB that has been gradual after her knee surgery. She has gained about 10 lbs, with fullness in her abdomen and orthopnea. SOB with mild exertion. Has tried taking metolazone 2-3 days in a row, with quick re-accumulation after stopping. She denies dizziness or lightheadedness. Knee pain well controlled, but somewhat worse with additional swelling. She states she is watching her salt and fluid intake.   ECHO 01/16/2017 --->EF 50% Sever HK  CPX 10/2016 Peak VO2: 18.5 (82% predicted peak VO2) VE/VCO2 slope: 30 OUES: 1.75 Peak RER: 1.14.  RHC/LHC 10/11/2016  RA = 4 RV = 24/5 PA = 23/4 (16) PCW = 9 Fick cardiac output/index = 4.4/2.2 SVR = 1580 PVR =  1.5 WU Ao sat = 95% PA sat = 58%, 62% Assessment: 1. Normal coronary arteries with separate ostial for LAD and LCX 2. Normal right heart pressures 3. NICM with EF ~20% by echo  Review of systems complete and found to be negative unless listed in HPI.    SH:  Social History   Social History  . Marital status: Divorced    Spouse name: N/A  . Number of children: N/A    . Years of education: N/A   Occupational History  . Not on file.   Social History Main Topics  . Smoking status: Never Smoker  . Smokeless tobacco: Never Used  . Alcohol use No  . Drug use: No  . Sexual activity: Not Currently   Other Topics Concern  . Not on file   Social History Narrative  . No narrative on file    FH:  Family History  Problem Relation Age of Onset  . Heart attack Father   . Diabetes Other   . Hypertension Other   . Heart attack Paternal Grandmother     Past Medical History:  Diagnosis Date  . Anemia   . Anginal pain ([place])   . Asthma   . Cervical cancer ([place])   . CHF (congestive heart failure) ([place])   . Diabetes mellitus without complication ([place])    steroid induced  . Discoid lupus   . Fibromyalgia   . History of blood transfusion "several"   "related to anemia; had some w/hysterectomy also"  . Hx of cardiovascular stress test    ETT-Myoview (9/15):  No ischemia, EF 52%; NORMAL  . Hx of echocardiogram    Echo (9/15):  EF 50-55%, ant HK, Gr 1 DD, mild MR, mild LAE, no effusion  . Hypertension   . Iron deficiency anemia    h/o iron transfusions  . Lupus (systemic lupus erythematosus) ([place])   . Migraine    "  a few/year" (07/03/2016)  . Pneumonia 12/2015  . RA (rheumatoid arthritis) ([place])    "all over" (07/03/2016)  . Sickle cell trait ([place])   . Stroke ([place]) 2014 X 1; 2015 X 2; 2016 X 1;    "right side of face more relaxed than the other; rare speech hesitation" (07/03/2016)    Current Outpatient Prescriptions  Medication Sig Dispense Refill  . ACTEMRA 162 MG/0.9ML SOSY Inject 162 mg into the skin every 14 (fourteen) days. Thursday night around midnight (0000)    . albuterol (ACCUNEB) 1.25 MG/3ML nebulizer solution Take 3 mLs (1.25 mg total) by nebulization every 6 (six) hours as needed for wheezing. 75 mL 12  . albuterol (PROVENTIL HFA;VENTOLIN HFA) 108 (90 Base) MCG/ACT inhaler Inhale 2 puffs into the lungs daily as needed for shortness of  breath.    Marland Kitchen aspirin 81 MG chewable tablet Chew 162 mg by mouth every morning.     Marland Kitchen BREO ELLIPTA 100-25 MCG/INH AEPB Inhale 1 puff into the lungs daily.  2  . carvedilol (COREG) 6.25 MG tablet Take 1 tablet (6.25 mg total) by mouth 2 (two) times daily with a meal. 60 tablet 3  . clonazePAM (KLONOPIN) 0.5 MG tablet Take 1 tablet (0.5 mg total) by mouth 3 (three) times daily as needed (Anxiety). 45 tablet 0  . diphenhydrAMINE (BENADRYL) 25 MG tablet Take 75 mg by mouth 3 (three) times daily.     Marland Kitchen ENTRESTO 97-103 MG TAKE 1 TABLET BY MOUTH TWICE DAILY 60 tablet 3  . folic acid (FOLVITE) 1 MG tablet TAKE 1 TABLET BY MOUTH EVERY MORNING 30 tablet 0  . hydrALAZINE (APRESOLINE) 50 MG tablet Take 1 tablet (50 mg total) by mouth 3 (three) times daily. 90 tablet 3  . hydroxychloroquine (PLAQUENIL) 200 MG tablet Take 200 mg by mouth 2 (two) times daily.    . insulin aspart (NOVOLOG) 100 UNIT/ML injection Inject 0-5 Units into the skin daily as needed for high blood sugar (over 150).    . isosorbide mononitrate (IMDUR) 30 MG 24 hr tablet Take 1 tablet by mouth 2 (two) times daily.  5  . meloxicam (MOBIC) 15 MG tablet Take 15 mg by mouth daily.     . methotrexate (RHEUMATREX) 2.5 MG tablet Take 20 mg by mouth once a week. Thursdays  3  . metolazone (ZAROXOLYN) 2.5 MG tablet Take 1 tablet (2.5 mg total) by mouth as directed. Take one tablet as needed for weight gain of 5 lbs within 3 days. 10 tablet 4  . montelukast (SINGULAIR) 10 MG tablet Take 1 tablet by mouth daily.  3  . Multiple Vitamin (MULTIVITAMIN WITH MINERALS) TABS Take 1 tablet by mouth every morning.     . ondansetron (ZOFRAN) 4 MG tablet Take 1 tablet (4 mg total) by mouth every 8 (eight) hours as needed for nausea or vomiting. 20 tablet 0  . PARoxetine (PAXIL) 10 MG tablet Take 1 tablet by mouth daily.  0  . potassium chloride SA (K-DUR,KLOR-CON) 20 MEQ tablet Take 2 tablets (40 mEq total) by mouth 3 (three) times daily. 180 tablet 6  .  predniSONE (DELTASONE) 20 MG tablet Take 40 mg by mouth daily with breakfast.    . spironolactone (ALDACTONE) 25 MG tablet Take 1 tablet (25 mg total) by mouth daily. 30 tablet 6  . torsemide (DEMADEX) 20 MG tablet Take 2 tablets (40 mg total) by mouth daily. 07/31/17 direction change 60 tablet 6  . traZODone (DESYREL) 50 MG tablet Take 1  tablet by mouth at bedtime.  5  . TRESIBA FLEXTOUCH 100 UNIT/ML SOPN FlexTouch Pen Inject 10 Units into the skin daily.  0   No current facility-administered medications for this encounter.     Vitals:   09/24/17 0922  BP: (!) 156/98  Pulse: 98  SpO2: 100%  Weight: 183 lb (83 kg)   Wt Readings from Last 3 Encounters:  09/24/17 183 lb (83 kg)  08/21/17 178 lb 9.6 oz (81 kg)  08/13/17 179 lb (81.2 kg)     PHYSICAL EXAM: General: Well appearing. No resp difficulty. HEENT: Normal Neck: Supple. JVP 9-10 cm.. Carotids 2+ bilat; no bruits. No thyromegaly or nodule noted. Cor: PMI nondisplaced. RRR, No M/G/R noted Lungs: CTAB, normal effort. Abdomen: Soft, non-tender, non-distended, no HSM. No bruits or masses. +BS  Extremities: No cyanosis, clubbing, or rash. RLE with brace and 1-2+ edema. LLE with trace to 1+ edema.  Neuro: Alert & orientedx3, cranial nerves grossly intact. moves all 4 extremities w/o difficulty. Affect pleasant    ASSESSMENT & PLAN: 1. Chronic Systolic Heart Failure - NICM Cath 10/11/16 with normal cors PCWP 9  --Cardiac MRI in 02/2015 showed EF 46%. No LGE. --CPX with very mild HF limitation and no high-risk features.--2D ECHO 12/2015 showed mild LVH, EF 35-40%, diff HK, normal diastolic function, mild MR, trivial pericardial effusion. ECHO 12/2016 EF 50%.  - Take torsemide 60 mg BID x 2 days, then decrease to new chronic dose of 40 mg BID.  - Continue metolazone AS needed per HF clinic direction. Have instructed her to take 2.5 mg tablet tomorrow am.  - Continue spiro  25 mg daily  - Continue coreg 6.25 mg BID.  - Continue  entresto 97/103 mg BID. - Continue hydralazine 50 mg three times a day.  - Plan repeat Echo in 12/2017. - BMET today.  2.  Lupus- Per Dr Eliberto Ivory.  3. HTN - Elevated with recent surgery and knee pain with swelling. Will focus on diuresis for now.  4. OSA - Continue nightly CPAP.   Increase torsemide as above with metolazone tomorrow. Labs today. RTC 2 weeks.   Satira Mccallum Tillery, PA-C  9:30 AM   Greater than 50% of the 25 minute visit was spent in counseling/coordination of care regarding disease state education, sliding scale diuretics, salt/fluid restriction, and medication reconciliation.

## 2017-09-26 ENCOUNTER — Other Ambulatory Visit (HOSPITAL_COMMUNITY): Payer: Self-pay | Admitting: Student

## 2017-09-26 DIAGNOSIS — I110 Hypertensive heart disease with heart failure: Secondary | ICD-10-CM | POA: Diagnosis not present

## 2017-09-26 DIAGNOSIS — I5033 Acute on chronic diastolic (congestive) heart failure: Secondary | ICD-10-CM | POA: Diagnosis not present

## 2017-09-26 DIAGNOSIS — M2500 Hemarthrosis, unspecified joint: Secondary | ICD-10-CM | POA: Diagnosis not present

## 2017-09-26 DIAGNOSIS — J45909 Unspecified asthma, uncomplicated: Secondary | ICD-10-CM | POA: Diagnosis not present

## 2017-09-26 DIAGNOSIS — M0579 Rheumatoid arthritis with rheumatoid factor of multiple sites without organ or systems involvement: Secondary | ICD-10-CM | POA: Diagnosis not present

## 2017-09-26 DIAGNOSIS — M358 Other specified systemic involvement of connective tissue: Secondary | ICD-10-CM | POA: Diagnosis not present

## 2017-09-26 DIAGNOSIS — I5022 Chronic systolic (congestive) heart failure: Secondary | ICD-10-CM | POA: Diagnosis not present

## 2017-09-26 DIAGNOSIS — M329 Systemic lupus erythematosus, unspecified: Secondary | ICD-10-CM | POA: Diagnosis not present

## 2017-09-26 DIAGNOSIS — E119 Type 2 diabetes mellitus without complications: Secondary | ICD-10-CM | POA: Diagnosis not present

## 2017-09-26 DIAGNOSIS — I420 Dilated cardiomyopathy: Secondary | ICD-10-CM | POA: Diagnosis not present

## 2017-09-26 DIAGNOSIS — J310 Chronic rhinitis: Secondary | ICD-10-CM | POA: Diagnosis not present

## 2017-09-26 DIAGNOSIS — D688 Other specified coagulation defects: Secondary | ICD-10-CM | POA: Diagnosis not present

## 2017-09-26 DIAGNOSIS — Z4789 Encounter for other orthopedic aftercare: Secondary | ICD-10-CM | POA: Diagnosis not present

## 2017-10-01 DIAGNOSIS — D688 Other specified coagulation defects: Secondary | ICD-10-CM | POA: Diagnosis not present

## 2017-10-01 DIAGNOSIS — M2500 Hemarthrosis, unspecified joint: Secondary | ICD-10-CM | POA: Diagnosis not present

## 2017-10-01 DIAGNOSIS — J310 Chronic rhinitis: Secondary | ICD-10-CM | POA: Diagnosis not present

## 2017-10-01 DIAGNOSIS — M329 Systemic lupus erythematosus, unspecified: Secondary | ICD-10-CM | POA: Diagnosis not present

## 2017-10-01 DIAGNOSIS — I5022 Chronic systolic (congestive) heart failure: Secondary | ICD-10-CM | POA: Diagnosis not present

## 2017-10-01 DIAGNOSIS — I5033 Acute on chronic diastolic (congestive) heart failure: Secondary | ICD-10-CM | POA: Diagnosis not present

## 2017-10-01 DIAGNOSIS — M0579 Rheumatoid arthritis with rheumatoid factor of multiple sites without organ or systems involvement: Secondary | ICD-10-CM | POA: Diagnosis not present

## 2017-10-01 DIAGNOSIS — I110 Hypertensive heart disease with heart failure: Secondary | ICD-10-CM | POA: Diagnosis not present

## 2017-10-01 DIAGNOSIS — E119 Type 2 diabetes mellitus without complications: Secondary | ICD-10-CM | POA: Diagnosis not present

## 2017-10-01 DIAGNOSIS — M358 Other specified systemic involvement of connective tissue: Secondary | ICD-10-CM | POA: Diagnosis not present

## 2017-10-01 DIAGNOSIS — J45909 Unspecified asthma, uncomplicated: Secondary | ICD-10-CM | POA: Diagnosis not present

## 2017-10-01 DIAGNOSIS — I420 Dilated cardiomyopathy: Secondary | ICD-10-CM | POA: Diagnosis not present

## 2017-10-01 DIAGNOSIS — Z4789 Encounter for other orthopedic aftercare: Secondary | ICD-10-CM | POA: Diagnosis not present

## 2017-10-05 DIAGNOSIS — G4733 Obstructive sleep apnea (adult) (pediatric): Secondary | ICD-10-CM | POA: Diagnosis not present

## 2017-10-06 ENCOUNTER — Encounter: Payer: Self-pay | Admitting: Obstetrics & Gynecology

## 2017-10-06 ENCOUNTER — Ambulatory Visit (INDEPENDENT_AMBULATORY_CARE_PROVIDER_SITE_OTHER): Payer: BLUE CROSS/BLUE SHIELD | Admitting: Obstetrics & Gynecology

## 2017-10-06 VITALS — BP 118/70 | HR 95 | Ht 67.0 in | Wt 178.5 lb

## 2017-10-06 DIAGNOSIS — N760 Acute vaginitis: Secondary | ICD-10-CM

## 2017-10-06 DIAGNOSIS — R87811 Vaginal high risk human papillomavirus (HPV) DNA test positive: Secondary | ICD-10-CM | POA: Diagnosis not present

## 2017-10-06 DIAGNOSIS — B9689 Other specified bacterial agents as the cause of diseases classified elsewhere: Secondary | ICD-10-CM | POA: Diagnosis not present

## 2017-10-06 MED ORDER — METRONIDAZOLE 0.75 % VA GEL: VAGINAL | 0 refills | Status: DC

## 2017-10-06 NOTE — Progress Notes (Signed)
Chief Complaint  Patient presents with  . Colposcopy    Blood pressure 118/70, pulse 95, height 5\' 7"  (1.702 m), weight 178 lb 8 oz (81 kg).  46 y.o. [id number] No LMP recorded. Patient has had a hysterectomy. The current method of family planning is status post hysterectomy.  Outpatient Encounter Prescriptions as of 10/06/2017  Medication Sig  . acetaminophen (TYLENOL) 500 MG tablet Take 1,500 mg by mouth 2 (two) times daily as needed.  . ACTEMRA 162 MG/0.9ML SOSY Inject 162 mg into the skin every 14 (fourteen) days. Thursday night around midnight (0000)  . albuterol (ACCUNEB) 1.25 MG/3ML nebulizer solution Take 3 mLs (1.25 mg total) by nebulization every 6 (six) hours as needed for wheezing. (Patient taking differently: Take 1 ampule by nebulization 2 (two) times daily. )  . albuterol (PROVENTIL HFA;VENTOLIN HFA) 108 (90 Base) MCG/ACT inhaler Inhale 2 puffs into the lungs daily as needed for shortness of breath.  Marland Kitchen aspirin 81 MG chewable tablet Chew 162 mg by mouth every morning.   Marland Kitchen BREO ELLIPTA 100-25 MCG/INH AEPB Inhale 1 puff into the lungs daily.  . carvedilol (COREG) 6.25 MG tablet Take 1 tablet (6.25 mg total) by mouth 2 (two) times daily with a meal.  . clonazePAM (KLONOPIN) 0.5 MG tablet Take 1 tablet (0.5 mg total) by mouth 3 (three) times daily as needed (Anxiety).  Marland Kitchen diphenhydrAMINE (BENADRYL) 25 MG tablet Take 75 mg by mouth 3 (three) times daily.   . folic acid (FOLVITE) 1 MG tablet TAKE 1 TABLET BY MOUTH EVERY MORNING  . hydrALAZINE (APRESOLINE) 50 MG tablet Take 1 tablet (50 mg total) by mouth 3 (three) times daily.  . hydroxychloroquine (PLAQUENIL) 200 MG tablet Take 200 mg by mouth 2 (two) times daily.  . isosorbide mononitrate (IMDUR) 30 MG 24 hr tablet Take 1 tablet (30 mg total) by mouth 2 (two) times daily.  . meloxicam (MOBIC) 15 MG tablet Take 15 mg by mouth daily.   . methotrexate (RHEUMATREX) 2.5 MG tablet Take 20 mg by mouth once a week. Thursdays  .  metolazone (ZAROXOLYN) 2.5 MG tablet Take 1 tablet (2.5 mg total) by mouth as directed. Take one tablet as needed for weight gain of 5 lbs within 3 days.  . montelukast (SINGULAIR) 10 MG tablet Take 1 tablet by mouth daily.  . Multiple Vitamin (MULTIVITAMIN WITH MINERALS) TABS Take 1 tablet by mouth every morning.   . ondansetron (ZOFRAN) 4 MG tablet Take 1 tablet (4 mg total) by mouth every 8 (eight) hours as needed for nausea or vomiting.  Marland Kitchen PARoxetine (PAXIL) 10 MG tablet Take 1 tablet by mouth daily.  . potassium chloride SA (K-DUR,KLOR-CON) 20 MEQ tablet Take 2 tablets (40 mEq total) by mouth 3 (three) times daily.  . predniSONE (DELTASONE) 20 MG tablet Take 20 mg by mouth daily with breakfast.   . sacubitril-valsartan (ENTRESTO) 97-103 MG Take 1 tablet by mouth 2 (two) times daily.  Marland Kitchen spironolactone (ALDACTONE) 25 MG tablet Take 1 tablet (25 mg total) by mouth daily.  Marland Kitchen torsemide (DEMADEX) 20 MG tablet Take 2 tablets (40 mg total) by mouth 2 (two) times daily.  . traZODone (DESYREL) 50 MG tablet Take 1 tablet by mouth at bedtime.  . TRESIBA FLEXTOUCH 100 UNIT/ML SOPN FlexTouch Pen Inject 10 Units into the skin daily.  . metroNIDAZOLE (METROGEL VAGINAL) 0.75 % vaginal gel Nightly x 5 nights  . [DISCONTINUED] hydrALAZINE (APRESOLINE) 50 MG tablet TAKE 1 TABLET(50 MG) BY MOUTH  THREE TIMES DAILY  . [DISCONTINUED] insulin aspart (NOVOLOG) 100 UNIT/ML injection Inject 0-5 Units into the skin daily as needed for high blood sugar (over 150).   No facility-administered encounter medications on file as of 10/06/2017.     Subjective Christine Mcdowell is referred in from [facility] Unfortunately she suffers with systemic lupus erythematosus and is on chronic immunosuppression for that She is status post a robotically assisted hysterectomy with removal of both tubes and ovaries and her cervix back in 2009 She states that prior to that she would occasionally have some HPV  issues But her hysterectomy was for benign reasons She had a Pap smear done in August which returned as atypical squamous cells with positive high risk HPV However in her case there is no indication to do vaginal cytology I reviewed the indications for vaginal cytology after hysterectomy with the patient which includes a history of cervical cancer, endometrial cancer or ovarian cancer which is really looking for a recurrence at the top of the vaginal cuff I explained to the patient that vaginal cancer is not screened by vaginal cytology, that accounts for only 3% of all GYN malignancies and generally does not follow a path of dysplasia to cancer 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 I have recommended that she gets them Doppler's which is RAM gnosis and reuteriwhich didn't decreases the incidence of BV and patient's by 67%  Pertinent ROS No burning with urination, frequency or urgency No nausea, vomiting or diarrhea Nor fever chills or other constitutional symptoms   Labs or studies Reviewed her Pap report from outside MD    Impression Diagnoses this Encounter::   ICD-10-CM   1. 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 BV (bacterial vaginosis) N76.0    B96.89     Established relevant diagnosis(es):   Plan/Recommendations: Meds ordered this encounter  Medications  . acetaminophen (TYLENOL) 500 MG tablet    Sig: Take 1,500 mg by mouth 2 (two) times daily as needed.  . metroNIDAZOLE (METROGEL VAGINAL) 0.75 % vaginal gel    Sig: Nightly x 5 nights    Dispense:  70 g    Refill:   0    Labs or Scans Ordered: No orders of the defined types were placed in this encounter.   Management:: No colposcopy is needed today and no further vaginal cytological evaluation is needed in the future  MetroGel every other night for 5 doses is prescribed for the bacterial vaginosis along with the femdophilus as a preventative  Follow up Return if symptoms worsen or fail to improve.   All questions were answered.  Past Medical History:  Diagnosis Date  . Anemia   . Anginal pain ([place])   . Asthma   . Cervical cancer ([place])   . CHF (congestive heart failure) ([place])   . 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 Discoid lupus   . Fibromyalgia   . History of blood transfusion "several"   "related to anemia; had some w/hysterectomy also"  .  Hx of cardiovascular stress test    ETT-Myoview (9/15):  No ischemia, EF 52%; NORMAL  . Hx of echocardiogram    Echo (9/15):  EF 50-55%, ant HK, Gr 1 DD, mild MR, mild LAE, no effusion  . Hypertension   . Iron deficiency anemia    h/o iron transfusions  . Lupus (systemic lupus erythematosus) ([place])   . Migraine    "a few/year" (07/03/2016)  . Pneumonia 12/2015  . RA (rheumatoid arthritis) ([place])    "all over" (07/03/2016)  . Sickle cell trait ([place])   . Stroke ([place]) 2014 X 1; 2015 X 2; 2016 X 1;    "right side of face more relaxed than the other; rare speech hesitation" (07/03/2016)  . Vaginal Pap smear, abnormal    ASCUS; HPV    Past Surgical History:  Procedure Laterality Date  . ABDOMINAL HYSTERECTOMY  2009  . ABDOMINAL WOUND DEHISCENCE  2009  . CARDIAC CATHETERIZATION N/A 10/11/2016   Procedure: Right/Left Heart Cath and Coronary Angiography;  Surgeon: Jolaine Artist, MD;  Location: [facility];  Service: Cardiovascular;  Laterality: N/A;  . DILATION AND CURETTAGE OF UTERUS  1991  . HEMATOMA EVACUATION  2009   abdomen  . INCISE AND DRAIN ABCESS  2009 X 2   "abdomen after hysterectomy"  . KNEE  ARTHROSCOPY Right 1997  . KNEE SURGERY Right   . TUBAL LIGATION  1996    OB History    Gravida Para Term Preterm AB Living   5 4 3 1 1 4    SAB TAB Ectopic Multiple Live Births   1       4      Allergies  Allergen Reactions  . Hydromorphone Nausea And Vomiting    Other reaction(s): GI Upset (intolerance), Hypertension (intolerance) Raises blood pressure   . Iodinated Diagnostic Agents Other (See Comments)    Shuts down kidneys  . Other Other (See Comments) and Anaphylaxis    Spicy foods and seasonings Skin Prep "makes my skin peel off" Paper tape causes skin burns  . Erythromycin Nausea And Vomiting  . Latex Hives  . Mircette [Desogestrel-Ethinyl Estradiol] Nausea And Vomiting and Rash    Social History   Social History  . Marital status: Divorced    Spouse name: N/A  . Number of children: N/A  . Years of education: N/A   Social History Main Topics  . Smoking status: Never Smoker  . Smokeless tobacco: Never Used  . Alcohol use No  . Drug use: No  . Sexual activity: Not Currently    Birth control/ protection: Surgical     Comment: hyst   Other Topics Concern  . None   Social History Narrative  . None    Family History  Problem Relation Age of Onset  . Arthritis Mother   . Heart murmur Mother   . Drug abuse Mother   . Heart attack Father   . Cushing syndrome Father   . Depression Father   . Dementia Paternal Grandmother   . Cancer Paternal Grandfather   . Diabetes Maternal Grandmother   . Hypertension Maternal Grandmother   . Heart attack Maternal Grandfather

## 2017-10-07 DIAGNOSIS — E109 Type 1 diabetes mellitus without complications: Secondary | ICD-10-CM | POA: Diagnosis not present

## 2017-10-07 DIAGNOSIS — Z794 Long term (current) use of insulin: Secondary | ICD-10-CM | POA: Diagnosis not present

## 2017-10-08 ENCOUNTER — Ambulatory Visit (HOSPITAL_COMMUNITY)
Admission: RE | Admit: 2017-10-08 | Discharge: 2017-10-08 | Disposition: A | Discharge status: Home / Self Care | Payer: BLUE CROSS/BLUE SHIELD | Source: Ambulatory Visit | Attending: Cardiology | Admitting: Cardiology

## 2017-10-08 ENCOUNTER — Encounter (HOSPITAL_COMMUNITY): Payer: Self-pay

## 2017-10-08 VITALS — BP 130/84 | HR 80 | Wt 179.0 lb

## 2017-10-08 DIAGNOSIS — Z8541 Personal history of malignant neoplasm of cervix uteri: Secondary | ICD-10-CM | POA: Insufficient documentation

## 2017-10-08 DIAGNOSIS — J45909 Unspecified asthma, uncomplicated: Secondary | ICD-10-CM | POA: Insufficient documentation

## 2017-10-08 DIAGNOSIS — Z809 Family history of malignant neoplasm, unspecified: Secondary | ICD-10-CM | POA: Insufficient documentation

## 2017-10-08 DIAGNOSIS — I429 Cardiomyopathy, unspecified: Secondary | ICD-10-CM | POA: Diagnosis not present

## 2017-10-08 DIAGNOSIS — I5020 Unspecified systolic (congestive) heart failure: Secondary | ICD-10-CM | POA: Diagnosis not present

## 2017-10-08 DIAGNOSIS — Z833 Family history of diabetes mellitus: Secondary | ICD-10-CM | POA: Insufficient documentation

## 2017-10-08 DIAGNOSIS — G4730 Sleep apnea, unspecified: Secondary | ICD-10-CM

## 2017-10-08 DIAGNOSIS — Z90710 Acquired absence of both cervix and uterus: Secondary | ICD-10-CM | POA: Insufficient documentation

## 2017-10-08 DIAGNOSIS — Z79899 Other long term (current) drug therapy: Secondary | ICD-10-CM | POA: Insufficient documentation

## 2017-10-08 DIAGNOSIS — M069 Rheumatoid arthritis, unspecified: Secondary | ICD-10-CM | POA: Insufficient documentation

## 2017-10-08 DIAGNOSIS — Z7982 Long term (current) use of aspirin: Secondary | ICD-10-CM | POA: Insufficient documentation

## 2017-10-08 DIAGNOSIS — D573 Sickle-cell trait: Secondary | ICD-10-CM | POA: Insufficient documentation

## 2017-10-08 DIAGNOSIS — Z8249 Family history of ischemic heart disease and other diseases of the circulatory system: Secondary | ICD-10-CM | POA: Diagnosis not present

## 2017-10-08 DIAGNOSIS — I5022 Chronic systolic (congestive) heart failure: Secondary | ICD-10-CM | POA: Diagnosis not present

## 2017-10-08 DIAGNOSIS — Z7902 Long term (current) use of antithrombotics/antiplatelets: Secondary | ICD-10-CM | POA: Insufficient documentation

## 2017-10-08 DIAGNOSIS — I10 Essential (primary) hypertension: Secondary | ICD-10-CM | POA: Diagnosis not present

## 2017-10-08 DIAGNOSIS — M329 Systemic lupus erythematosus, unspecified: Secondary | ICD-10-CM | POA: Diagnosis not present

## 2017-10-08 DIAGNOSIS — Z8261 Family history of arthritis: Secondary | ICD-10-CM | POA: Insufficient documentation

## 2017-10-08 DIAGNOSIS — I110 Hypertensive heart disease with heart failure: Secondary | ICD-10-CM | POA: Diagnosis not present

## 2017-10-08 DIAGNOSIS — G894 Chronic pain syndrome: Secondary | ICD-10-CM | POA: Diagnosis not present

## 2017-10-08 DIAGNOSIS — Z818 Family history of other mental and behavioral disorders: Secondary | ICD-10-CM | POA: Insufficient documentation

## 2017-10-08 DIAGNOSIS — G43909 Migraine, unspecified, not intractable, without status migrainosus: Secondary | ICD-10-CM | POA: Diagnosis not present

## 2017-10-08 DIAGNOSIS — M797 Fibromyalgia: Secondary | ICD-10-CM | POA: Diagnosis not present

## 2017-10-08 DIAGNOSIS — G4733 Obstructive sleep apnea (adult) (pediatric): Secondary | ICD-10-CM | POA: Diagnosis not present

## 2017-10-08 DIAGNOSIS — Z8673 Personal history of transient ischemic attack (TIA), and cerebral infarction without residual deficits: Secondary | ICD-10-CM | POA: Insufficient documentation

## 2017-10-08 DIAGNOSIS — E119 Type 2 diabetes mellitus without complications: Secondary | ICD-10-CM | POA: Diagnosis not present

## 2017-10-08 LAB — BASIC METABOLIC PANEL
Anion gap: 8 (ref 5–15)
BUN: 17 mg/dL (ref 6–20)
CO2: 24 mmol/L (ref 22–32)
Calcium: 9 mg/dL (ref 8.9–10.3)
Chloride: 105 mmol/L (ref 101–111)
Creatinine, Ser: 0.83 mg/dL (ref 0.44–1.00)
GFR calc Af Amer: >60 mL/min (ref >60)
GFR calc non Af Amer: >60 mL/min (ref >60)
Glucose, Bld: 134 mg/dL — ABNORMAL HIGH (ref 65–99)
Potassium: 4 mmol/L (ref 3.5–5.1)
Sodium: 137 mmol/L (ref 135–145)

## 2017-10-08 LAB — CBC
HCT: 34.7 % — ABNORMAL LOW (ref 36.0–46.0)
Hemoglobin: 11.1 g/dL — ABNORMAL LOW (ref 12.0–15.0)
MCH: 28.8 pg (ref 26.0–34.0)
MCHC: 32 g/dL (ref 30.0–36.0)
MCV: 90.1 fL (ref 78.0–100.0)
Platelets: 313 10*3/uL (ref 150–400)
RBC: 3.85 MIL/uL — ABNORMAL LOW (ref 3.87–5.11)
RDW: 14.5 % (ref 11.5–15.5)
WBC: 11 10*3/uL — ABNORMAL HIGH (ref 4.0–10.5)

## 2017-10-08 MED ORDER — HYDRALAZINE HCL 50 MG PO TABS: 75.0000 mg | ORAL_TABLET | Freq: Three times a day (TID) | ORAL | 6 refills | Status: DC

## 2017-10-08 NOTE — Progress Notes (Signed)
Advanced Heart Failure Clinic Note    Primary Cardiologist:  Dr Johnsie Cancel  Rheumatologist: Dr Eliberto Ivory  HF: Dr. Haroldine Laws   HPI: Christine Mcdowell is a 46 y.o. 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   Cardiac MR in 02/2015 showed EF 46%. No LGE.  2D ECHO 12/2015 showed mild LVH, EF 35-40%, diff HK, normal diastolic function, mild MR, trivial pericardial effusion.  Admitted in October 2017 with increased dyspnea and volume overload. Diuresed with IV lasix and transitioned to lasix 40 mg twice a day. LHC/RHC normal filling pressures, EF ~20%, and normal coronaries. Discharge weight 184 pounds.    Had R total knee "reconstruction" 08/26/17.   She presents today for 2 week follow up.  Last visit torsemide increased with volume overload. She is feeling much better from a breathing and fluid standpoint.  Still having trouble with her BP being up. SBPs run in 150-170s at times.  She has had several outliers as high as 220 and 270.   Unclear if this is due to inaccuracy, as she usually doesn't check again for a few hours.  She woke up with a headache this morning. Initial BP 270, but repeat 981 systolic.  She denies lightheadedness or dizziness. She is having some soreness from scar tissue in her new, but has appointment coming up this week. Watching salt and fluid. Hasn't needed any extra torsemide.   ECHO 01/16/2017 --->EF 50% Sever HK  CPX 10/2016 Peak VO2: 18.5 (82% predicted peak VO2) VE/VCO2 slope: 30 OUES: 1.75 Peak RER: 1.14.  RHC/LHC 10/11/2016  RA = 4 RV = 24/5 PA = 23/4 (16) PCW = 9 Fick cardiac output/index = 4.4/2.2 SVR = 1580 PVR =  1.5 WU Ao sat = 95% PA sat = 58%, 62% Assessment: 1. Normal coronary arteries with separate ostial for LAD and LCX 2. Normal right heart pressures 3.  NICM with EF ~20% by echo  Review of systems complete and found to be negative unless listed in HPI.    SH:  Social History   Social History  . Marital status: Divorced    Spouse name: N/A  . Number of children: N/A  . Years of education: N/A   Occupational History  . Not on file.   Social History Main Topics  . Smoking status: Never Smoker  . Smokeless tobacco: Never Used  . Alcohol use No  . Drug use: No  . Sexual activity: Not Currently    Birth control/ protection: Surgical     Comment: hyst   Other Topics Concern  . Not on file   Social History Narrative  . No narrative on file   FH:  Family History  Problem Relation Age of Onset  . Arthritis Mother   . Heart murmur Mother   . Drug abuse Mother   . Heart attack Father   . Cushing syndrome Father   . Depression Father   . Dementia Paternal Grandmother   . Cancer Paternal Grandfather   . Diabetes Maternal Grandmother   . Hypertension Maternal Grandmother   . Heart attack Maternal Grandfather    Past Medical History:  Diagnosis Date  . Anemia   . Anginal pain ([place])   . Asthma   . Cervical cancer ([place])   . CHF (congestive heart failure) ([place])   . Diabetes mellitus without  complication ([place])    steroid induced  . Discoid lupus   . Fibromyalgia   . History of blood transfusion "several"   "related to anemia; had some w/hysterectomy also"  . Hx of cardiovascular stress test    ETT-Myoview (9/15):  No ischemia, EF 52%; NORMAL  . Hx of echocardiogram    Echo (9/15):  EF 50-55%, ant HK, Gr 1 DD, mild MR, mild LAE, no effusion  . Hypertension   . Iron deficiency anemia    h/o iron transfusions  . Lupus (systemic lupus erythematosus) ([place])   . Migraine    "a few/year" (07/03/2016)  . Pneumonia 12/2015  . RA (rheumatoid arthritis) ([place])    "all over" (07/03/2016)  . Sickle cell trait ([place])   . Stroke ([place]) 2014 X 1; 2015 X 2; 2016 X 1;    "right side of face more relaxed than the other; rare speech  hesitation" (07/03/2016)  . Vaginal Pap smear, abnormal    ASCUS; HPV    Current Outpatient Prescriptions  Medication Sig Dispense Refill  . acetaminophen (TYLENOL) 500 MG tablet Take 1,500 mg by mouth 2 (two) times daily as needed.    . ACTEMRA 162 MG/0.9ML SOSY Inject 162 mg into the skin every 14 (fourteen) days. Thursday night around midnight (0000)    . albuterol (ACCUNEB) 1.25 MG/3ML nebulizer solution Take 3 mLs (1.25 mg total) by nebulization every 6 (six) hours as needed for wheezing. (Patient taking differently: Take 1 ampule by nebulization 2 (two) times daily. ) 75 mL 12  . albuterol (PROVENTIL HFA;VENTOLIN HFA) 108 (90 Base) MCG/ACT inhaler Inhale 2 puffs into the lungs daily as needed for shortness of breath.    Marland Kitchen aspirin 81 MG chewable tablet Chew 162 mg by mouth every morning.     Marland Kitchen BREO ELLIPTA 100-25 MCG/INH AEPB Inhale 1 puff into the lungs daily.  2  . carvedilol (COREG) 6.25 MG tablet Take 1 tablet (6.25 mg total) by mouth 2 (two) times daily with a meal. 60 tablet 3  . clonazePAM (KLONOPIN) 0.5 MG tablet Take 1 tablet (0.5 mg total) by mouth 3 (three) times daily as needed (Anxiety). 45 tablet 0  . diphenhydrAMINE (BENADRYL) 25 MG tablet Take 75 mg by mouth 3 (three) times daily.     . folic acid (FOLVITE) 1 MG tablet TAKE 1 TABLET BY MOUTH EVERY MORNING 30 tablet 0  . hydrALAZINE (APRESOLINE) 50 MG tablet Take 1 tablet (50 mg total) by mouth 3 (three) times daily. 90 tablet 3  . hydroxychloroquine (PLAQUENIL) 200 MG tablet Take 200 mg by mouth 2 (two) times daily.    . isosorbide mononitrate (IMDUR) 30 MG 24 hr tablet Take 1 tablet (30 mg total) by mouth 2 (two) times daily. 60 tablet 5  . meloxicam (MOBIC) 15 MG tablet Take 15 mg by mouth daily.     . methotrexate (RHEUMATREX) 2.5 MG tablet Take 20 mg by mouth once a week. Thursdays  3  . metolazone (ZAROXOLYN) 2.5 MG tablet Take 1 tablet (2.5 mg total) by mouth as directed. Take one tablet as needed for weight gain of 5  lbs within 3 days. 5 tablet 1  . metroNIDAZOLE (METROGEL VAGINAL) 0.75 % vaginal gel Nightly x 5 nights 70 g 0  . montelukast (SINGULAIR) 10 MG tablet Take 1 tablet by mouth daily.  3  . Multiple Vitamin (MULTIVITAMIN WITH MINERALS) TABS Take 1 tablet by mouth every morning.     . ondansetron (ZOFRAN) 4 MG tablet  Take 1 tablet (4 mg total) by mouth every 8 (eight) hours as needed for nausea or vomiting. 20 tablet 0  . PARoxetine (PAXIL) 10 MG tablet Take 1 tablet by mouth daily.  0  . potassium chloride SA (K-DUR,KLOR-CON) 20 MEQ tablet Take 2 tablets (40 mEq total) by mouth 3 (three) times daily. 180 tablet 6  . predniSONE (DELTASONE) 20 MG tablet Take 20 mg by mouth daily with breakfast.     . sacubitril-valsartan (ENTRESTO) 97-103 MG Take 1 tablet by mouth 2 (two) times daily. 60 tablet 3  . spironolactone (ALDACTONE) 25 MG tablet Take 1 tablet (25 mg total) by mouth daily. 30 tablet 6  . torsemide (DEMADEX) 20 MG tablet Take 2 tablets (40 mg total) by mouth 2 (two) times daily. 120 tablet 6  . traZODone (DESYREL) 50 MG tablet Take 1 tablet by mouth at bedtime.  5  . TRESIBA FLEXTOUCH 100 UNIT/ML SOPN FlexTouch Pen Inject 10 Units into the skin daily.  0   No current facility-administered medications for this encounter.    Vitals:   10/08/17 1449  BP: 130/84  Pulse: 80  SpO2: 100%  Weight: 179 lb (81.2 kg)     Wt Readings from Last 3 Encounters:  10/08/17 179 lb (81.2 kg)  10/06/17 178 lb 8 oz (81 kg)  09/24/17 183 lb (83 kg)    PHYSICAL EXAM: General: Well appearing. No resp difficulty. HEENT: Normal Neck: Supple. JVP 9-10 cm.. Carotids 2+ bilat; no bruits. No thyromegaly or nodule noted. Cor: PMI nondisplaced. RRR, No M/G/R noted Lungs: CTAB, normal effort. Abdomen: Soft, non-tender, non-distended, no HSM. No bruits or masses. +BS  Extremities: No cyanosis, clubbing, or rash. RLE with brace and 1-2+ edema. LLE with trace to 1+ edema.  Neuro: Alert & orientedx3, cranial  nerves grossly intact. moves all 4 extremities w/o difficulty. Affect pleasant   ASSESSMENT & PLAN: 1. Chronic Systolic Heart Failure - NICM Cath 10/11/16 with normal cors PCWP 9  --Cardiac MRI in 02/2015 showed EF 46%. No LGE. --CPX with very mild HF limitation and no high-risk features.--2D ECHO 12/2015 showed mild LVH, EF 35-40%, diff HK, normal diastolic function, mild MR, trivial pericardial effusion. ECHO 12/2016 EF 50%.  - Continue torsemide 40 mg BID. BMET today.  - Continue metolazone AS needed per HF clinic direction. - Continue spiro  25 mg daily  - Continue coreg 6.25 mg BID.  - Continue entresto 97/103 mg BID. - Increase hydralazine 75 mg TID - Plan repeat Echo in 12/2017. 2.  Lupus - Per Dr Eliberto Ivory. No change. 3. HTN - Has been elevated at home, though ? Accuracy of electronic cuff.   - Meds as above.  4. OSA - She was off for ~ a week with power outage, but now has power again. Encouraged nightly use.  5. Migraines - May be somewhat contributing to her HAs.  Follow up with PCP as needed.   Fluid status much improved. BP running high. Meds as above. Have asked her to keep BP log and report back.  Pt knows to call with BP > 180, and to report to ED with any alarm symptoms such as weakness (especially unilateral), vision changes, the worst headache of her life, or slurred speech.   Shirley Friar, PA-C  2:52 PM   Greater than 50% of the 25 minute visit was spent in counseling/coordination of care regarding disease state education, HA alarm symptoms, salt/fluid restriction, and medication reconciliation.

## 2017-10-08 NOTE — Patient Instructions (Signed)
Routine lab work today. Will notify you of abnormal results, otherwise no news is good news!  INCREASE Hydralazine to 75 mg (1.5 tabs) three times daily.  Check blood pressure every morning when you first wake up before medications and in the afternoon (same time every time). Call to report results after two weeks. Call CHF clinic if systolic (top number) ever 180 or above.  Follow up 2-3 months with Dr. Haroldine Laws and echocardiogram.  Take all medication as prescribed the day of your appointment. Bring all medications with you to your appointment.  Do the following things EVERYDAY: 1) Weigh yourself in the morning before breakfast. Write it down and keep it in a log. 2) Take your medicines as prescribed 3) Eat low salt foods-Limit salt (sodium) to 2000 mg per day.  4) Stay as active as you can everyday 5) Limit all fluids for the day to less than 2 liters

## 2017-10-09 ENCOUNTER — Encounter: Payer: Self-pay | Admitting: Cardiology

## 2017-10-09 DIAGNOSIS — M25561 Pain in right knee: Secondary | ICD-10-CM | POA: Diagnosis not present

## 2017-10-09 DIAGNOSIS — Z9889 Other specified postprocedural states: Secondary | ICD-10-CM | POA: Insufficient documentation

## 2017-10-09 DIAGNOSIS — M1711 Unilateral primary osteoarthritis, right knee: Secondary | ICD-10-CM | POA: Diagnosis not present

## 2017-10-13 DIAGNOSIS — Z90710 Acquired absence of both cervix and uterus: Secondary | ICD-10-CM | POA: Diagnosis not present

## 2017-10-13 DIAGNOSIS — M329 Systemic lupus erythematosus, unspecified: Secondary | ICD-10-CM | POA: Diagnosis not present

## 2017-10-13 DIAGNOSIS — D688 Other specified coagulation defects: Secondary | ICD-10-CM | POA: Diagnosis not present

## 2017-10-14 DIAGNOSIS — G4733 Obstructive sleep apnea (adult) (pediatric): Secondary | ICD-10-CM | POA: Diagnosis not present

## 2017-10-21 ENCOUNTER — Telehealth: Payer: Self-pay | Admitting: *Deleted

## 2017-10-21 NOTE — Telephone Encounter (Signed)
Informed patient of compliance results and verbalized understanding was indicated. Patient understands her apnea events are in normal range at 3.0. Patient understands she needs to improve on her compliance but she explains she has air blowing from under the her mask when she is sleeping and she can not get a different style of mask without a doctors order. Patient says the mask she has now fits over her nose and her mouth.

## 2017-10-21 NOTE — Telephone Encounter (Signed)
-----   Message from Sueanne Margarita, MD sent at 10/19/2017  5:31 PM EDT ----- Good AHI on CPAP but needs to improve compliance

## 2017-10-22 ENCOUNTER — Encounter (HOSPITAL_COMMUNITY): Payer: Self-pay | Admitting: Emergency Medicine

## 2017-10-22 ENCOUNTER — Emergency Department (HOSPITAL_COMMUNITY): Payer: BLUE CROSS/BLUE SHIELD

## 2017-10-22 ENCOUNTER — Emergency Department (HOSPITAL_COMMUNITY)
Admission: EM | Admit: 2017-10-22 | Discharge: 2017-10-23 | Disposition: A | Discharge status: Home / Self Care | Payer: BLUE CROSS/BLUE SHIELD | Attending: Emergency Medicine | Admitting: Emergency Medicine

## 2017-10-22 DIAGNOSIS — Z7982 Long term (current) use of aspirin: Secondary | ICD-10-CM | POA: Insufficient documentation

## 2017-10-22 DIAGNOSIS — R079 Chest pain, unspecified: Secondary | ICD-10-CM | POA: Diagnosis not present

## 2017-10-22 DIAGNOSIS — Z794 Long term (current) use of insulin: Secondary | ICD-10-CM | POA: Insufficient documentation

## 2017-10-22 DIAGNOSIS — N3000 Acute cystitis without hematuria: Secondary | ICD-10-CM

## 2017-10-22 DIAGNOSIS — I5022 Chronic systolic (congestive) heart failure: Secondary | ICD-10-CM | POA: Diagnosis not present

## 2017-10-22 DIAGNOSIS — J181 Lobar pneumonia, unspecified organism: Secondary | ICD-10-CM | POA: Diagnosis not present

## 2017-10-22 DIAGNOSIS — Z79899 Other long term (current) drug therapy: Secondary | ICD-10-CM | POA: Insufficient documentation

## 2017-10-22 DIAGNOSIS — M3210 Systemic lupus erythematosus, organ or system involvement unspecified: Secondary | ICD-10-CM | POA: Diagnosis not present

## 2017-10-22 DIAGNOSIS — R0602 Shortness of breath: Secondary | ICD-10-CM | POA: Diagnosis not present

## 2017-10-22 DIAGNOSIS — J189 Pneumonia, unspecified organism: Secondary | ICD-10-CM | POA: Diagnosis not present

## 2017-10-22 DIAGNOSIS — R0789 Other chest pain: Secondary | ICD-10-CM | POA: Diagnosis not present

## 2017-10-22 DIAGNOSIS — Z91040 Latex allergy status: Secondary | ICD-10-CM | POA: Insufficient documentation

## 2017-10-22 DIAGNOSIS — E119 Type 2 diabetes mellitus without complications: Secondary | ICD-10-CM | POA: Insufficient documentation

## 2017-10-22 DIAGNOSIS — I110 Hypertensive heart disease with heart failure: Secondary | ICD-10-CM | POA: Diagnosis not present

## 2017-10-22 LAB — URINALYSIS, ROUTINE W REFLEX MICROSCOPIC
Bilirubin Urine: NEGATIVE
Glucose, UA: NEGATIVE mg/dL
Hgb urine dipstick: NEGATIVE
Ketones, ur: NEGATIVE mg/dL
Nitrite: NEGATIVE
Protein, ur: NEGATIVE mg/dL
Specific Gravity, Urine: 1.008 (ref 1.005–1.030)
pH: 9 — ABNORMAL HIGH (ref 5.0–8.0)

## 2017-10-22 LAB — LIPASE, BLOOD: Lipase: 27 U/L (ref 11–51)

## 2017-10-22 LAB — I-STAT TROPONIN, ED: Troponin i, poc: 0 ng/mL (ref 0.00–0.08)

## 2017-10-22 LAB — COMPREHENSIVE METABOLIC PANEL
ALT: 14 U/L (ref 14–54)
AST: 15 U/L (ref 15–41)
Albumin: 3.7 g/dL (ref 3.5–5.0)
Alkaline Phosphatase: 45 U/L (ref 38–126)
Anion gap: 7 (ref 5–15)
BUN: 10 mg/dL (ref 6–20)
CO2: 27 mmol/L (ref 22–32)
Calcium: 8.4 mg/dL — ABNORMAL LOW (ref 8.9–10.3)
Chloride: 102 mmol/L (ref 101–111)
Creatinine, Ser: 1.08 mg/dL — ABNORMAL HIGH (ref 0.44–1.00)
GFR calc Af Amer: >60 mL/min (ref >60)
GFR calc non Af Amer: 60 mL/min — ABNORMAL LOW (ref >60)
Glucose, Bld: 94 mg/dL (ref 65–99)
Potassium: 3.2 mmol/L — ABNORMAL LOW (ref 3.5–5.1)
Sodium: 136 mmol/L (ref 135–145)
Total Bilirubin: 0.7 mg/dL (ref 0.3–1.2)
Total Protein: 5.7 g/dL — ABNORMAL LOW (ref 6.5–8.1)

## 2017-10-22 LAB — CBC WITH DIFFERENTIAL/PLATELET
Basophils Absolute: 0 10*3/uL (ref 0.0–0.1)
Basophils Relative: 0 %
Eosinophils Absolute: 0 10*3/uL (ref 0.0–0.7)
Eosinophils Relative: 0 %
HCT: 37.3 % (ref 36.0–46.0)
Hemoglobin: 12.1 g/dL (ref 12.0–15.0)
Lymphocytes Relative: 13 %
Lymphs Abs: 0.9 10*3/uL (ref 0.7–4.0)
MCH: 29.2 pg (ref 26.0–34.0)
MCHC: 32.4 g/dL (ref 30.0–36.0)
MCV: 90.1 fL (ref 78.0–100.0)
Monocytes Absolute: 0.5 10*3/uL (ref 0.1–1.0)
Monocytes Relative: 7 %
Neutro Abs: 5.9 10*3/uL (ref 1.7–7.7)
Neutrophils Relative %: 80 %
Platelets: 189 10*3/uL (ref 150–400)
RBC: 4.14 MIL/uL (ref 3.87–5.11)
RDW: 15.2 % (ref 11.5–15.5)
WBC: 7.4 10*3/uL (ref 4.0–10.5)

## 2017-10-22 LAB — PREGNANCY, URINE: Preg Test, Ur: NEGATIVE

## 2017-10-22 LAB — BRAIN NATRIURETIC PEPTIDE: B Natriuretic Peptide: 174.2 pg/mL — ABNORMAL HIGH (ref 0.0–100.0)

## 2017-10-22 MED ORDER — PROCHLORPERAZINE EDISYLATE 5 MG/ML IJ SOLN
5.0000 mg | Freq: Once | INTRAMUSCULAR | Status: AC
  Administered 2017-10-22: 5 mg via INTRAVENOUS
  Filled 2017-10-22: qty 2

## 2017-10-22 MED ORDER — DIPHENHYDRAMINE HCL 50 MG/ML IJ SOLN
25.0000 mg | Freq: Once | INTRAMUSCULAR | Status: AC
  Administered 2017-10-22: 25 mg via INTRAVENOUS
  Filled 2017-10-22: qty 1

## 2017-10-22 NOTE — ED Triage Notes (Signed)
Per EMS, pt from home with c/o intermittent stabbing chest pain x 2 days. Hx CHF and Lupus. Recently completed antibiotics for "blood infection". Given 4mg  zofran, 324 aspirin and 2 NTG PTA. No change in pain after nitro.

## 2017-10-22 NOTE — ED Provider Notes (Signed)
[facility] EMERGENCY DEPARTMENT Provider Note   CSN: [id number] Arrival date & time: 10/22/17  2014     History   Chief Complaint Chief Complaint  Patient presents with  . Chest Pain    HPI Christine Mcdowell is a 46 y.o. female with history of lupus, CHF, stroke, hypertension who presents with a 2-day history of chest pain and shortness of breath.  She describes her chest pain as left-sided and dull.  It is worse with taking a deep breath.  She has also had associated peripheral edema as well as abdominal distention.  She is also had associated nausea and vomiting.  She has been not been able to keep down her medications or fluids since this morning.  She reports she feels that she is having a lupus flare, that is due to the weather change, however she does not usually have chest pain associated.  She feels like she may have CHF involvement as well.  She has not been able to take any of her diuretics today.  She has not been urinating as much today either.  She otherwise denies any fever or cough.  She has had some abdominal pain, but relates it to "hurting all over.  Patient is followed by the [facility] heart failure clinic and [facility].  Patient reports history of blood clots in her abdomen after her hysterectomy.  She has heparin shots at home in case.  HPI  Past Medical History:  Diagnosis Date  . Anemia   . Anginal pain ([place])   . Asthma   . Cervical cancer ([place])   . CHF (congestive heart failure) ([place])   . 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 Discoid lupus   . Fibromyalgia   . History of blood transfusion "several"   "related to anemia; had some w/hysterectomy also"  . Hx of cardiovascular stress test    ETT-Myoview (9/15):  No ischemia, EF 52%; NORMAL  . Hx of echocardiogram    Echo (9/15):  EF 50-55%, ant HK, Gr 1 DD, mild MR, mild LAE, no effusion  . Hypertension   . Iron deficiency anemia    h/o iron  transfusions  . Lupus (systemic lupus erythematosus) ([place])   . Migraine    "a few/year" (07/03/2016)  . Pneumonia 12/2015  . RA (rheumatoid arthritis) ([place])    "all over" (07/03/2016)  . Sickle cell trait ([place])   . Stroke ([place]) 2014 X 1; 2015 X 2; 2016 X 1;    "right side of face more relaxed than the other; rare speech hesitation" (07/03/2016)  . Vaginal Pap smear, abnormal    ASCUS; HPV    Patient Active Problem List   Diagnosis Date Noted  . Migraines 10/08/2017  . Sleep apnea with use of continuous positive airway pressure (CPAP) 09/24/2017  . Congestive heart failure (CHF) ([place]) 10/09/2016  . Respiratory distress 10/09/2016  . Rheumatoid arthritis ([place]) 10/09/2016  . Asthma 10/09/2016  . Chronic pain 10/09/2016  . Heart failure ([place]) 10/09/2016  . Congestive heart failure ([place])   . Chest pain 07/03/2016  . Thrombocytopenia ([place]) 07/03/2016  . Lupus (systemic lupus erythematosus) ([place])   . Diabetes mellitus with complication ([place])   . Anxiety state   . GERD (gastroesophageal reflux disease) 12/28/2015  . Essential hypertension 12/26/2015  . Type 2 diabetes mellitus ([place]) 12/26/2015  . Chronic systolic heart failure ([place]) 08/18/2014  . Cardiomyopathy, dilated ([place]) 01/27/2014  . Hypokalemia 01/27/2014  .  Shortness of breath 01/26/2014  . SLE (systemic lupus erythematosus) ([place]) 01/26/2014    Past Surgical History:  Procedure Laterality Date  . ABDOMINAL HYSTERECTOMY  2009  . ABDOMINAL WOUND DEHISCENCE  2009  . CARDIAC CATHETERIZATION N/A 10/11/2016   Procedure: Right/Left Heart Cath and Coronary Angiography;  Surgeon: Jolaine Artist, MD;  Location: [facility];  Service: Cardiovascular;  Laterality: N/A;  . DILATION AND CURETTAGE OF UTERUS  1991  . HEMATOMA EVACUATION  2009   abdomen  . INCISE AND DRAIN ABCESS  2009 X 2   "abdomen after hysterectomy"  . KNEE ARTHROSCOPY Right 1997  . KNEE SURGERY Right   . TUBAL LIGATION  1996    OB History     Gravida Para Term Preterm AB Living   5 4 3 1 1 4    SAB TAB Ectopic Multiple Live Births   1       4       Home Medications    Prior to Admission medications   Medication Sig Start Date End Date Taking? Authorizing Provider  acetaminophen (TYLENOL) 500 MG tablet Take 1,500 mg by mouth 2 (two) times daily as needed for mild pain or fever.    Yes [provider]  ACTEMRA 162 MG/0.9ML SOSY Inject 162 mg into the skin every 14 (fourteen) days. Thursday night around midnight (0000) 11/09/15  Yes [provider]  albuterol (ACCUNEB) 1.25 MG/3ML nebulizer solution Take 3 mLs (1.25 mg total) by nebulization every 6 (six) hours as needed for wheezing. Patient taking differently: Take 1 ampule by nebulization 2 (two) times daily.  03/04/16  Yes Lacretia Leigh, MD  albuterol (PROVENTIL HFA;VENTOLIN HFA) 108 (90 Base) MCG/ACT inhaler Inhale 2 puffs into the lungs daily as needed for shortness of breath.   Yes [provider]  aspirin 81 MG chewable tablet Chew 162 mg by mouth every morning.    Yes [provider]  BREO ELLIPTA 100-25 MCG/INH AEPB Inhale 1 puff into the lungs daily. 07/16/16  Yes [provider]  carvedilol (COREG) 6.25 MG tablet Take 1 tablet (6.25 mg total) by mouth 2 (two) times daily with a meal. 09/24/17  Yes Tillery, Satira Mccallum, PA-C  clonazePAM (KLONOPIN) 0.5 MG tablet Take 1 tablet (0.5 mg total) by mouth 3 (three) times daily as needed (Anxiety). 10/14/16  Yes Mikhail, Velta Addison, DO  diphenhydrAMINE (BENADRYL) 25 MG tablet Take 75 mg by mouth 3 (three) times daily.    Yes [provider]  folic acid (FOLVITE) 1 MG tablet TAKE 1 TABLET BY MOUTH EVERY MORNING 01/30/17  Yes Bensimhon, Shaune Pascal, MD  hydrALAZINE (APRESOLINE) 50 MG tablet Take 1.5 tablets (75 mg total) by mouth 3 (three) times daily. 10/08/17  Yes Shirley Friar, PA-C  hydroxychloroquine (PLAQUENIL) 200 MG tablet Take 200 mg by mouth 2 (two) times daily.   Yes  [provider]  meloxicam (MOBIC) 15 MG tablet Take 15 mg by mouth daily.    Yes [provider]  methotrexate (RHEUMATREX) 2.5 MG tablet Take 20 mg by mouth once a week. On Friday 02/06/15  Yes [provider]  metolazone (ZAROXOLYN) 2.5 MG tablet Take 1 tablet (2.5 mg total) by mouth as directed. Take one tablet as needed for weight gain of 5 lbs within 3 days. 09/24/17 12/23/17 Yes Tillery, Satira Mccallum, PA-C  metroNIDAZOLE (METROGEL VAGINAL) 0.75 % vaginal gel Nightly x 5 nights Patient taking differently: Place 1 Applicatorful vaginally See admin instructions. 5 nights a week  10/06/17  Yes Florian Buff, MD  montelukast (SINGULAIR) 10 MG tablet Take 10 mg by mouth daily.  07/11/17  Yes [provider]  Multiple Vitamin (MULTIVITAMIN WITH MINERALS) TABS Take 1 tablet by mouth every morning.    Yes [provider]  ondansetron (ZOFRAN) 4 MG tablet Take 1 tablet (4 mg total) by mouth every 8 (eight) hours as needed for nausea or vomiting. 12/30/15  Yes Robbie Lis, MD  PARoxetine (PAXIL) 10 MG tablet Take 10 mg by mouth daily.  07/28/17  Yes [provider]  potassium chloride SA (K-DUR,KLOR-CON) 20 MEQ tablet Take 2 tablets (40 mEq total) by mouth 3 (three) times daily. 08/22/17  Yes Clegg, Amy D, NP  predniSONE (DELTASONE) 20 MG tablet Take 20 mg by mouth daily with breakfast.    Yes [provider]  sacubitril-valsartan (ENTRESTO) 97-103 MG Take 1 tablet by mouth 2 (two) times daily. 09/24/17  Yes Shirley Friar, PA-C  spironolactone (ALDACTONE) 25 MG tablet Take 1 tablet (25 mg total) by mouth daily. 08/21/17  Yes Clegg, Amy D, NP  torsemide (DEMADEX) 20 MG tablet Take 2 tablets (40 mg total) by mouth 2 (two) times daily. 09/24/17  Yes Shirley Friar, PA-C  traZODone (DESYREL) 50 MG tablet Take 1 tablet by mouth at bedtime. 07/24/17  Yes [provider]  TRESIBA FLEXTOUCH 100 UNIT/ML SOPN FlexTouch Pen Inject 10  Units into the skin daily. 07/05/17  Yes [provider]  azithromycin (ZITHROMAX) 250 MG tablet Take 1 tablet (250 mg total) by mouth daily. Take first 2 tablets together, then 1 every day until finished. 10/23/17   Law, Bea Graff, PA-C  cephALEXin (KEFLEX) 500 MG capsule Take 1 capsule (500 mg total) by mouth 3 (three) times daily. 10/23/17   Law, Bea Graff, PA-C  isosorbide mononitrate (IMDUR) 30 MG 24 hr tablet Take 1 tablet (30 mg total) by mouth 2 (two) times daily. Patient not taking: Reported on 10/22/2017 09/24/17   Shirley Friar, PA-C    Family History Family History  Problem Relation Age of Onset  . Arthritis Mother   . Heart murmur Mother   . Drug abuse Mother   . Heart attack Father   . Cushing syndrome Father   . Depression Father   . Dementia Paternal Grandmother   . Cancer Paternal Grandfather   . Diabetes Maternal Grandmother   . Hypertension Maternal Grandmother   . Heart attack Maternal Grandfather     Social History Social History  Substance Use Topics  . Smoking status: Never Smoker  . Smokeless tobacco: Never Used  . Alcohol use No     Allergies   Hydromorphone; Iodinated diagnostic agents; Other; Erythromycin; Latex; and Mircette [desogestrel-ethinyl estradiol]   Review of Systems Review of Systems  Constitutional: Negative for chills and fever.  HENT: Negative for facial swelling and sore throat.   Respiratory: Positive for shortness of breath.   Cardiovascular: Positive for chest pain.  Gastrointestinal: Positive for abdominal pain, nausea and vomiting.  Genitourinary: Negative for dysuria.  Musculoskeletal: Negative for back pain.  Skin: Negative for rash and wound.  Neurological: Negative for headaches.  Psychiatric/Behavioral: The patient is not nervous/anxious.      Physical Exam Updated Vital Signs BP 118/82   Pulse (!) 110   Temp 99.6 F (37.6 C) (Oral)   Resp 17   SpO2 99%   Physical Exam  Constitutional:  She appears well-developed and well-nourished. No distress.  HENT:  Head: Normocephalic  and atraumatic.  Mouth/Throat: Oropharynx is clear and moist. No oropharyngeal exudate.  Eyes: Pupils are equal, round, and reactive to light. Conjunctivae are normal. Right eye exhibits no discharge. Left eye exhibits no discharge. No scleral icterus.  Neck: Normal range of motion. Neck supple. No thyromegaly present.  Cardiovascular: Normal rate, regular rhythm, normal heart sounds and intact distal pulses.  Exam reveals no gallop and no friction rub.   No murmur heard. Pulmonary/Chest: Effort normal. No stridor. No respiratory distress. She has no wheezes. She has rales.  Abdominal: Soft. Bowel sounds are normal. She exhibits no distension. There is generalized tenderness. There is no rebound and no guarding.  Musculoskeletal: She exhibits no edema.  Lymphadenopathy:    She has no cervical adenopathy.  Neurological: She is alert. Coordination normal.  Skin: Skin is warm and dry. No rash noted. She is not diaphoretic. No pallor.  Psychiatric: She has a normal mood and affect.  Nursing note and vitals reviewed.    ED Treatments / Results  Labs (all labs ordered are listed, but only abnormal results are displayed) Labs Reviewed  COMPREHENSIVE METABOLIC PANEL - Abnormal; Notable for the following:       Result Value   Potassium 3.2 (*)    Creatinine, Ser 1.08 (*)    Calcium 8.4 (*)    Total Protein 5.7 (*)    GFR calc non Af Amer 60 (*)    All other components within normal limits  BRAIN NATRIURETIC PEPTIDE - Abnormal; Notable for the following:    B Natriuretic Peptide 174.2 (*)    All other components within normal limits  URINALYSIS, ROUTINE W REFLEX MICROSCOPIC - Abnormal; Notable for the following:    APPearance HAZY (*)    pH 9.0 (*)    Leukocytes, UA MODERATE (*)    Bacteria, UA FEW (*)    Squamous Epithelial / LPF 0-5 (*)    All other components within normal limits  URINE CULTURE    CBC WITH DIFFERENTIAL/PLATELET  LIPASE, BLOOD  PREGNANCY, URINE  D-DIMER, QUANTITATIVE (NOT AT [facility])  I-STAT TROPONIN, ED    EKG  EKG Interpretation  Date/Time:  Wednesday October 22 2017 20:44:45 EDT Ventricular Rate:  100 PR Interval:    QRS Duration: 76 QT Interval:  339 QTC Calculation: 438 R Axis:   -18 Text Interpretation:  Sinus tachycardia Probable left atrial enlargement Borderline left axis deviation Anteroseptal infarct, age indeterminate Nonspecific T abnormalities, lateral leads No acute changes Confirmed by Varney Biles [phone]) on 10/22/2017 9:00:59 PM       Radiology Dg Chest 2 View  Result Date: 10/22/2017 CLINICAL DATA:  Chest pain and shortness of breath beginning today. Pain extends to the left arm. EXAM: CHEST  2 VIEW COMPARISON:  05/08/2017 FINDINGS: Heart and mediastinal shadows are normal. Right lung is clear. There is mild patchy density in the left lower lobe consistent with atelectasis or mild pneumonia. No effusions. No abnormal bone finding. IMPRESSION: Patchy density in the left lower lobe consistent with atelectasis or mild pneumonia. Electronically Signed   By: Nelson Chimes M.D.   On: 10/22/2017 21:29    Procedures Procedures (including critical care time)  Medications Ordered in ED Medications  furosemide (LASIX) injection 80 mg (80 mg Intravenous Refused 10/23/17 0054)  prochlorperazine (COMPAZINE) injection 5 mg (5 mg Intravenous Given 10/22/17 2203)  diphenhydrAMINE (BENADRYL) injection 25 mg (25 mg Intravenous Given 10/22/17 2204)     Initial Impression / Assessment and Plan / ED Course  I  have reviewed the triage vital signs and the nursing notes.  Pertinent labs & imaging results that were available during my care of the patient were reviewed by me and considered in my medical decision making (see chart for details).     Patient with probable pneumonia on chest x-ray as well as UTI.  CBC unremarkable.  Potassium 3.2, creatinine  1.08.  D-dimer is negative.  Troponin negative.  Chest pain has been present for 2 days.  Her BNP was 174.2.  Patient declined IV Lasix in the ED.  UA shows moderate leukocytes, too numerous to count WBCs.  Urine culture sent.  Urine pregnancy negative.  EKG shows sinus tachycardia, rate 100, with no acute changes.  Will discharge home with azithromycin and Keflex.  Patient advised to follow-up with heart failure clinic and PCP.  Patient tolerating PO prior to discharge.  Return precautions discussed.  Patient understands and agrees with plan.  Patient vitals stable and discharged in satisfactory condition.  Patient also evaluated by Dr. Kathrynn Humble who guided the patient's management and agrees with plan.  Final Clinical Impressions(s) / ED Diagnoses   Final diagnoses:  Community acquired pneumonia of left lower lobe of lung ([place])  Acute cystitis without hematuria    New Prescriptions New Prescriptions   AZITHROMYCIN (ZITHROMAX) 250 MG TABLET    Take 1 tablet (250 mg total) by mouth daily. Take first 2 tablets together, then 1 every day until finished.   CEPHALEXIN (KEFLEX) 500 MG CAPSULE    Take 1 capsule (500 mg total) by mouth 3 (three) times daily.     Frederica Kuster, PA-C 10/23/17 Donaldson, Ankit, MD 10/23/17 [phone]

## 2017-10-22 NOTE — ED Notes (Signed)
No answer for triage x1 

## 2017-10-23 LAB — D-DIMER, QUANTITATIVE (NOT AT ARMC): D-Dimer, Quant: 0.38 ug/mL-FEU (ref 0.00–0.50)

## 2017-10-23 MED ORDER — FUROSEMIDE 10 MG/ML IJ SOLN
80.0000 mg | Freq: Once | INTRAMUSCULAR | Status: DC
Start: 2017-10-23 — End: 2017-10-23

## 2017-10-23 MED ORDER — AZITHROMYCIN 250 MG PO TABS: 250.0000 mg | ORAL_TABLET | Freq: Every day | ORAL | 0 refills | Status: DC

## 2017-10-23 MED ORDER — CEPHALEXIN 500 MG PO CAPS: 500.0000 mg | ORAL_CAPSULE | Freq: Three times a day (TID) | ORAL | 0 refills | Status: DC

## 2017-10-23 NOTE — Discharge Instructions (Signed)
Medications: Azithromycin, Keflex  Treatment: We have ruled out a blood clot in your lungs today.  You do not have any significant acute heart failure requiring hospital admission at this time.  You had a finding on chest x-ray today that is suspicious for pneumonia.  You also have a urinary tract infection.  Please take the above antibiotics as prescribed.  Please take Lasix as prescribed when you get home.  Follow-up: Please follow-up with your doctor for recheck after today's visit.  Please return to the emergency department if you develop any new or worsening symptoms.

## 2017-10-23 NOTE — ED Notes (Signed)
RN called to room. Pt insistent she cannot breathe and wants non rebreather placed.  Pt reassured. Pt unable to be convinced her oxygen saturation is wnl.  RN observed pt taking home pain medication administered by visitors. Advised against this

## 2017-10-24 ENCOUNTER — Encounter (HOSPITAL_COMMUNITY): Payer: Self-pay | Admitting: Internal Medicine

## 2017-10-25 LAB — URINE CULTURE
Culture: >=100000 — AB
Special Requests: NORMAL

## 2017-10-26 ENCOUNTER — Telehealth: Payer: Self-pay | Admitting: Emergency Medicine

## 2017-10-26 NOTE — Telephone Encounter (Signed)
Post ED Visit - Positive Culture Follow-up  Culture report reviewed by antimicrobial stewardship pharmacist:  []  Elenor Quinones, Pharm.D. []  Heide Guile, Pharm.D., BCPS AQ-ID [x]  Parks Neptune, Pharm.D., BCPS []  Alycia Rossetti, Pharm.D., BCPS []  [place], Pharm.D., BCPS, AAHIVP []  Legrand Como, Pharm.D., BCPS, AAHIVP []  Salome Arnt, PharmD, BCPS []  Dimitri Ped, PharmD, BCPS []  Vincenza Hews, PharmD, BCPS  Positive urine culture Treated with cephalexin, and azithromycin, organism sensitive to the same and no further patient follow-up is required at this time.  Hazle Nordmann 10/26/2017, 3:43 PM

## 2017-10-27 ENCOUNTER — Other Ambulatory Visit (HOSPITAL_COMMUNITY): Payer: Self-pay | Admitting: Internal Medicine

## 2017-10-27 ENCOUNTER — Encounter (HOSPITAL_COMMUNITY): Payer: Self-pay

## 2017-10-27 ENCOUNTER — Ambulatory Visit (HOSPITAL_COMMUNITY)
Admission: RE | Admit: 2017-10-27 | Discharge: 2017-10-27 | Disposition: A | Discharge status: Home / Self Care | Payer: BLUE CROSS/BLUE SHIELD | Source: Ambulatory Visit | Attending: Cardiology | Admitting: Cardiology

## 2017-10-27 VITALS — BP 136/74 | HR 100 | Wt 179.0 lb

## 2017-10-27 DIAGNOSIS — E119 Type 2 diabetes mellitus without complications: Secondary | ICD-10-CM | POA: Insufficient documentation

## 2017-10-27 DIAGNOSIS — D688 Other specified coagulation defects: Secondary | ICD-10-CM | POA: Insufficient documentation

## 2017-10-27 DIAGNOSIS — I10 Essential (primary) hypertension: Secondary | ICD-10-CM

## 2017-10-27 DIAGNOSIS — I429 Cardiomyopathy, unspecified: Secondary | ICD-10-CM | POA: Insufficient documentation

## 2017-10-27 DIAGNOSIS — Z8673 Personal history of transient ischemic attack (TIA), and cerebral infarction without residual deficits: Secondary | ICD-10-CM | POA: Diagnosis not present

## 2017-10-27 DIAGNOSIS — J45909 Unspecified asthma, uncomplicated: Secondary | ICD-10-CM | POA: Insufficient documentation

## 2017-10-27 DIAGNOSIS — Z7952 Long term (current) use of systemic steroids: Secondary | ICD-10-CM | POA: Insufficient documentation

## 2017-10-27 DIAGNOSIS — I5020 Unspecified systolic (congestive) heart failure: Secondary | ICD-10-CM | POA: Diagnosis not present

## 2017-10-27 DIAGNOSIS — Z79899 Other long term (current) drug therapy: Secondary | ICD-10-CM | POA: Insufficient documentation

## 2017-10-27 DIAGNOSIS — Z794 Long term (current) use of insulin: Secondary | ICD-10-CM | POA: Diagnosis not present

## 2017-10-27 DIAGNOSIS — G43909 Migraine, unspecified, not intractable, without status migrainosus: Secondary | ICD-10-CM | POA: Insufficient documentation

## 2017-10-27 DIAGNOSIS — D573 Sickle-cell trait: Secondary | ICD-10-CM | POA: Insufficient documentation

## 2017-10-27 DIAGNOSIS — I5022 Chronic systolic (congestive) heart failure: Secondary | ICD-10-CM | POA: Insufficient documentation

## 2017-10-27 DIAGNOSIS — M329 Systemic lupus erythematosus, unspecified: Secondary | ICD-10-CM | POA: Diagnosis not present

## 2017-10-27 DIAGNOSIS — I110 Hypertensive heart disease with heart failure: Secondary | ICD-10-CM | POA: Insufficient documentation

## 2017-10-27 DIAGNOSIS — Z8541 Personal history of malignant neoplasm of cervix uteri: Secondary | ICD-10-CM | POA: Diagnosis not present

## 2017-10-27 DIAGNOSIS — M069 Rheumatoid arthritis, unspecified: Secondary | ICD-10-CM | POA: Diagnosis not present

## 2017-10-27 DIAGNOSIS — Z7982 Long term (current) use of aspirin: Secondary | ICD-10-CM | POA: Insufficient documentation

## 2017-10-27 DIAGNOSIS — G4730 Sleep apnea, unspecified: Secondary | ICD-10-CM

## 2017-10-27 DIAGNOSIS — G4733 Obstructive sleep apnea (adult) (pediatric): Secondary | ICD-10-CM | POA: Diagnosis not present

## 2017-10-27 LAB — BASIC METABOLIC PANEL
Anion gap: 10 (ref 5–15)
BUN: 8 mg/dL (ref 6–20)
CO2: 22 mmol/L (ref 22–32)
Calcium: 8.4 mg/dL — ABNORMAL LOW (ref 8.9–10.3)
Chloride: 107 mmol/L (ref 101–111)
Creatinine, Ser: 0.87 mg/dL (ref 0.44–1.00)
GFR calc Af Amer: >60 mL/min (ref >60)
GFR calc non Af Amer: >60 mL/min (ref >60)
Glucose, Bld: 135 mg/dL — ABNORMAL HIGH (ref 65–99)
Potassium: 3.5 mmol/L (ref 3.5–5.1)
Sodium: 139 mmol/L (ref 135–145)

## 2017-10-27 MED ORDER — CARVEDILOL 6.25 MG PO TABS: 6.2500 mg | ORAL_TABLET | Freq: Two times a day (BID) | ORAL | 3 refills | Status: DC

## 2017-10-27 NOTE — Patient Instructions (Addendum)
Routine lab work today. Will notify you of abnormal results, otherwise no news is good news!  Follow up as scheduled next month with Dr. Haroldine Laws and echocardiogram.  Take all medication as prescribed the day of your appointment. Bring all medications with you to your appointment.  Do the following things EVERYDAY: 1) Weigh yourself in the morning before breakfast. Write it down and keep it in a log. 2) Take your medicines as prescribed 3) Eat low salt foods-Limit salt (sodium) to 2000 mg per day.  4) Stay as active as you can everyday 5) Limit all fluids for the day to less than 2 liters

## 2017-10-27 NOTE — Progress Notes (Signed)
ReDS Vest - 10/27/17 1200      ReDS Vest   MR   No    Fitting Posture  Standing    Height Marker  Tall    Ruler Value  15    Center Strip  Aligned    ReDS Value  27

## 2017-10-27 NOTE — Progress Notes (Signed)
Advanced Heart Failure Clinic Note    Primary Cardiologist:  Dr Johnsie Cancel  Rheumatologist: Dr Eliberto Ivory  HF: Dr. Haroldine Laws   HPI: Christine Mcdowell is a 46 y.o. 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 S/P R ACL repair 08/2017.   Admitted in October 2017 with increased dyspnea and volume overload. Diuresed with IV lasix and transitioned to lasix 40 mg twice a day. LHC/RHC normal filling pressures, EF ~20%, and normal coronaries. Discharge weight 184 pounds.   Admitted to [facility] in September for ACL repair. Also received IV lasix due hospital admit.   Today she returns for HF follow up. Last visit hydralazine was increased 75 mg three times a day. She was seen in the ED 10/31 for increased shortness of breath. Renal function was stable and BNP was 174.  Placed antibiotics (azithromycin and keflex) for UTI and pneumonia. Also says she has had lupus flare. Overall feeling ok. SOB with exertion. Denies PND/Orthopnea. Ongoing cough. Appetite poor. Taking all medications.       ECHO 01/16/2017 --->EF 50% Sever HK  CPX 10/2016 Peak VO2: 18.5 (82% predicted peak VO2) VE/VCO2 slope: 30 OUES: 1.75 Peak RER: 1.14.  RHC/LHC 10/11/2016  RA = 4 RV = 24/5 PA = 23/4 (16) PCW = 9 Fick cardiac output/index = 4.4/2.2 SVR = 1580 PVR =  1.5 WU Ao sat = 95% PA sat = 58%, 62% Assessment: 1. Normal coronary arteries with separate ostial for LAD and LCX 2. Normal right heart pressures 3. NICM with EF ~20% by echo  Review of systems complete and found to be negative unless listed in HPI.    SH:  Social History   Socioeconomic History  . Marital status: Divorced    Spouse name: Not on file  . Number of children: Not on file  . Years of education: Not on file  . Highest education level: Not on file  Social Needs  . Financial resource  strain: Not on file  . Food insecurity - worry: Not on file  . Food insecurity - inability: Not on file  . Transportation needs - medical: Not on file  . Transportation needs - non-medical: Not on file  Occupational History  . Not on file  Tobacco Use  . Smoking status: Never Smoker  . Smokeless tobacco: Never Used  Substance and Sexual Activity  . Alcohol use: No  . Drug use: No  . Sexual activity: Not Currently    Birth control/protection: Surgical    Comment: hyst  Other Topics Concern  . Not on file  Social History Narrative  . Not on file   FH:  Family History  Problem Relation Age of Onset  . Arthritis Mother   . Heart murmur Mother   . Drug abuse Mother   . Heart attack Father   . Cushing syndrome Father   . Depression Father   . Dementia Paternal Grandmother   . Cancer Paternal Grandfather   . Diabetes Maternal Grandmother   . Hypertension Maternal Grandmother   . Heart attack Maternal Grandfather    Past Medical History:  Diagnosis Date  . Anemia   . Anginal pain ([place])   . Asthma   . Cervical cancer ([place])   . CHF (congestive heart failure) ([place])   . Diabetes mellitus without complication ([place])    steroid induced  . Discoid lupus   .  Fibromyalgia   . History of blood transfusion "several"   "related to anemia; had some w/hysterectomy also"  . Hx of cardiovascular stress test    ETT-Myoview (9/15):  No ischemia, EF 52%; NORMAL  . Hx of echocardiogram    Echo (9/15):  EF 50-55%, ant HK, Gr 1 DD, mild MR, mild LAE, no effusion  . Hypertension   . Iron deficiency anemia    h/o iron transfusions  . Lupus (systemic lupus erythematosus) ([place])   . Migraine    "a few/year" (07/03/2016)  . Pneumonia 12/2015  . RA (rheumatoid arthritis) ([place])    "all over" (07/03/2016)  . Sickle cell trait ([place])   . Stroke ([place]) 2014 X 1; 2015 X 2; 2016 X 1;    "right side of face more relaxed than the other; rare speech hesitation" (07/03/2016)  . Vaginal Pap smear, abnormal     ASCUS; HPV    Current Outpatient Medications  Medication Sig Dispense Refill  . acetaminophen (TYLENOL) 500 MG tablet Take 1,500 mg by mouth 2 (two) times daily as needed for mild pain or fever.     . ACTEMRA 162 MG/0.9ML SOSY Inject 162 mg into the skin every 14 (fourteen) days. Thursday night around midnight (0000)    . albuterol (ACCUNEB) 1.25 MG/3ML nebulizer solution Take 3 mLs (1.25 mg total) by nebulization every 6 (six) hours as needed for wheezing. (Patient taking differently: Take 1 ampule by nebulization 2 (two) times daily. ) 75 mL 12  . albuterol (PROVENTIL HFA;VENTOLIN HFA) 108 (90 Base) MCG/ACT inhaler Inhale 2 puffs into the lungs daily as needed for shortness of breath.    Marland Kitchen aspirin 81 MG chewable tablet Chew 162 mg by mouth every morning.     Marland Kitchen azithromycin (ZITHROMAX) 250 MG tablet Take 1 tablet (250 mg total) by mouth daily. Take first 2 tablets together, then 1 every day until finished. 6 tablet 0  . BREO ELLIPTA 100-25 MCG/INH AEPB Inhale 1 puff into the lungs daily.  2  . carvedilol (COREG) 6.25 MG tablet Take 1 tablet (6.25 mg total) by mouth 2 (two) times daily with a meal. 60 tablet 3  . cephALEXin (KEFLEX) 500 MG capsule Take 1 capsule (500 mg total) by mouth 3 (three) times daily. 21 capsule 0  . clonazePAM (KLONOPIN) 0.5 MG tablet Take 1 tablet (0.5 mg total) by mouth 3 (three) times daily as needed (Anxiety). 45 tablet 0  . diphenhydrAMINE (BENADRYL) 25 MG tablet Take 75 mg by mouth 3 (three) times daily.     . folic acid (FOLVITE) 1 MG tablet TAKE 1 TABLET BY MOUTH EVERY MORNING 30 tablet 0  . hydrALAZINE (APRESOLINE) 50 MG tablet Take 1.5 tablets (75 mg total) by mouth 3 (three) times daily. 135 tablet 6  . hydroxychloroquine (PLAQUENIL) 200 MG tablet Take 200 mg by mouth 2 (two) times daily.    . isosorbide mononitrate (IMDUR) 30 MG 24 hr tablet Take 1 tablet (30 mg total) by mouth 2 (two) times daily. 60 tablet 5  . meloxicam (MOBIC) 15 MG tablet Take 15 mg  by mouth daily.     . methotrexate (RHEUMATREX) 2.5 MG tablet Take 20 mg by mouth once a week. On Friday  3  . metolazone (ZAROXOLYN) 2.5 MG tablet Take 1 tablet (2.5 mg total) by mouth as directed. Take one tablet as needed for weight gain of 5 lbs within 3 days. 5 tablet 1  . metroNIDAZOLE (METROGEL VAGINAL) 0.75 % vaginal gel Nightly x  5 nights (Patient taking differently: Place 1 Applicatorful vaginally See admin instructions. 5 nights a week) 70 g 0  . montelukast (SINGULAIR) 10 MG tablet Take 10 mg by mouth daily.   3  . Multiple Vitamin (MULTIVITAMIN WITH MINERALS) TABS Take 1 tablet by mouth every morning.     . ondansetron (ZOFRAN) 4 MG tablet Take 1 tablet (4 mg total) by mouth every 8 (eight) hours as needed for nausea or vomiting. 20 tablet 0  . PARoxetine (PAXIL) 10 MG tablet Take 10 mg by mouth daily.   0  . potassium chloride SA (K-DUR,KLOR-CON) 20 MEQ tablet Take 2 tablets (40 mEq total) by mouth 3 (three) times daily. 180 tablet 6  . predniSONE (DELTASONE) 20 MG tablet Take 20 mg by mouth daily with breakfast.     . sacubitril-valsartan (ENTRESTO) 97-103 MG Take 1 tablet by mouth 2 (two) times daily. 60 tablet 3  . spironolactone (ALDACTONE) 25 MG tablet Take 1 tablet (25 mg total) by mouth daily. 30 tablet 6  . torsemide (DEMADEX) 20 MG tablet Take 2 tablets (40 mg total) by mouth 2 (two) times daily. 120 tablet 6  . traZODone (DESYREL) 50 MG tablet Take 1 tablet by mouth at bedtime.  5  . TRESIBA FLEXTOUCH 100 UNIT/ML SOPN FlexTouch Pen Inject 10 Units into the skin daily.  0   No current facility-administered medications for this encounter.    Vitals:   10/27/17 1136  BP: 136/74  Pulse: 100  SpO2: 98%  Weight: 179 lb (81.2 kg)     Wt Readings from Last 3 Encounters:  10/27/17 179 lb (81.2 kg)  10/08/17 179 lb (81.2 kg)  10/06/17 178 lb 8 oz (81 kg)   .Reds VEST =27 PHYSICAL EXAM: General:  Well appearing. No resp difficulty. Walked slowly in the clinic.  HEENT:  normal Neck: supple. JVP hard to assess. Carotids 2+ bilat; no bruits. No lymphadenopathy or thryomegaly appreciated. Cor: PMI nondisplaced. Regular rate & rhythm. No rubs, gallops or murmurs. Lungs: clear Abdomen: soft, nontender, nondistended. No hepatosplenomegaly. No bruits or masses. Good bowel sounds. Extremities: no cyanosis, clubbing, rash, edema. LLE knee brace.  Neuro: alert & orientedx3, cranial nerves grossly intact. moves all 4 extremities w/o difficulty. Affect pleasant    ASSESSMENT & PLAN: 1. Chronic Systolic Heart Failure - NICM Cath 10/11/16 with normal cors --Cardiac MRI in 02/2015 showed EF 46%. No LGE. --CPX with very mild HF limitation and no high-risk features. - ECHO 12/2016 EF 50%.  NYHA III. Volume status stable. Reds Vest 27%.  - Continue current HF medications. No change.  - Continue torsemide 40 mg BID.  - Continue metolazone as needed per HF clinic direction. - Continue spiro  25 mg daily  - Continue coreg 6.25 mg BID.  - Continue entresto 97/103 mg BID. - Continue hydralazine 75 mg TID. Not on imdur due to headaches.  - Repeat ECHO 11/2018  2.  Lupus  - on chronic prednisone  3. HTN - Stable.  4. OSA -Contiue nightly CPAP.  5. Migraines -Stable today.    6. Hypertension Stable today.  7. Hypofibrinogenemia- followed at [facility]  -Check BMET and BNP today.  -Discussed cardiomems and started application process.  -She is NYHA III. Admitted to [facility] in September 2018 for ACL repair.  Diuresed with IV lasix during that hospitalization.   Greater than 50% of the (total minutes 25) visit spent in counseling/coordination of care regarding cardiomems and heart failure.    Follow up  4 weeks.   Darrick Grinder, NP  11:38 AM

## 2017-10-31 DIAGNOSIS — E119 Type 2 diabetes mellitus without complications: Secondary | ICD-10-CM | POA: Diagnosis not present

## 2017-10-31 DIAGNOSIS — M329 Systemic lupus erythematosus, unspecified: Secondary | ICD-10-CM | POA: Diagnosis not present

## 2017-10-31 DIAGNOSIS — J310 Chronic rhinitis: Secondary | ICD-10-CM | POA: Diagnosis not present

## 2017-10-31 DIAGNOSIS — Z4789 Encounter for other orthopedic aftercare: Secondary | ICD-10-CM | POA: Diagnosis not present

## 2017-10-31 DIAGNOSIS — M358 Other specified systemic involvement of connective tissue: Secondary | ICD-10-CM | POA: Diagnosis not present

## 2017-10-31 DIAGNOSIS — M2500 Hemarthrosis, unspecified joint: Secondary | ICD-10-CM | POA: Diagnosis not present

## 2017-10-31 DIAGNOSIS — I110 Hypertensive heart disease with heart failure: Secondary | ICD-10-CM | POA: Diagnosis not present

## 2017-10-31 DIAGNOSIS — J45909 Unspecified asthma, uncomplicated: Secondary | ICD-10-CM | POA: Diagnosis not present

## 2017-10-31 DIAGNOSIS — I5033 Acute on chronic diastolic (congestive) heart failure: Secondary | ICD-10-CM | POA: Diagnosis not present

## 2017-10-31 DIAGNOSIS — M948X6 Other specified disorders of cartilage, lower leg: Secondary | ICD-10-CM | POA: Diagnosis not present

## 2017-10-31 DIAGNOSIS — D688 Other specified coagulation defects: Secondary | ICD-10-CM | POA: Diagnosis not present

## 2017-10-31 DIAGNOSIS — M25461 Effusion, right knee: Secondary | ICD-10-CM | POA: Diagnosis not present

## 2017-10-31 DIAGNOSIS — I420 Dilated cardiomyopathy: Secondary | ICD-10-CM | POA: Diagnosis not present

## 2017-10-31 DIAGNOSIS — M0579 Rheumatoid arthritis with rheumatoid factor of multiple sites without organ or systems involvement: Secondary | ICD-10-CM | POA: Diagnosis not present

## 2017-10-31 DIAGNOSIS — I5022 Chronic systolic (congestive) heart failure: Secondary | ICD-10-CM | POA: Diagnosis not present

## 2017-11-04 ENCOUNTER — Other Ambulatory Visit: Payer: BLUE CROSS/BLUE SHIELD | Admitting: Adult Health

## 2017-11-05 DIAGNOSIS — G4733 Obstructive sleep apnea (adult) (pediatric): Secondary | ICD-10-CM | POA: Diagnosis not present

## 2017-11-06 ENCOUNTER — Telehealth (HOSPITAL_COMMUNITY): Payer: Self-pay

## 2017-11-06 DIAGNOSIS — M358 Other specified systemic involvement of connective tissue: Secondary | ICD-10-CM | POA: Diagnosis not present

## 2017-11-06 DIAGNOSIS — M0579 Rheumatoid arthritis with rheumatoid factor of multiple sites without organ or systems involvement: Secondary | ICD-10-CM | POA: Diagnosis not present

## 2017-11-06 DIAGNOSIS — Z7952 Long term (current) use of systemic steroids: Secondary | ICD-10-CM | POA: Diagnosis not present

## 2017-11-06 DIAGNOSIS — Z79899 Other long term (current) drug therapy: Secondary | ICD-10-CM | POA: Diagnosis not present

## 2017-11-06 NOTE — Telephone Encounter (Signed)
Pt has seen Lupus MD and states her signs and symptoms are lupus related and has prescribe new Tx plan. (see previous telephone note).

## 2017-11-06 NOTE — Telephone Encounter (Signed)
Advanced Heart Failure Triage Encounter  Patient Name: Christine Mcdowell  Date of Call: 11/06/17  Problem:  Pt called into triage c/o cough, sob, congestion, and chest pain. Was just treated for pneumonia in left lobe and these symptoms appeared after completing the course of prescribed ABX. Pt is headed to [facility] to see Lupus MD, was advised to go to ED for immediate Tx.   Plan:    Shirley Muscat, RN

## 2017-11-18 ENCOUNTER — Emergency Department (HOSPITAL_COMMUNITY): Payer: BLUE CROSS/BLUE SHIELD

## 2017-11-18 ENCOUNTER — Other Ambulatory Visit: Payer: Self-pay

## 2017-11-18 ENCOUNTER — Telehealth (HOSPITAL_COMMUNITY): Payer: Self-pay

## 2017-11-18 ENCOUNTER — Emergency Department (HOSPITAL_COMMUNITY)
Admission: EM | Admit: 2017-11-18 | Discharge: 2017-11-18 | Disposition: A | Discharge status: Home / Self Care | Payer: BLUE CROSS/BLUE SHIELD | Attending: Emergency Medicine | Admitting: Emergency Medicine

## 2017-11-18 ENCOUNTER — Encounter (HOSPITAL_COMMUNITY): Payer: Self-pay | Admitting: Emergency Medicine

## 2017-11-18 DIAGNOSIS — R0602 Shortness of breath: Secondary | ICD-10-CM | POA: Diagnosis not present

## 2017-11-18 DIAGNOSIS — Z79899 Other long term (current) drug therapy: Secondary | ICD-10-CM | POA: Insufficient documentation

## 2017-11-18 DIAGNOSIS — Z794 Long term (current) use of insulin: Secondary | ICD-10-CM | POA: Diagnosis not present

## 2017-11-18 DIAGNOSIS — E119 Type 2 diabetes mellitus without complications: Secondary | ICD-10-CM | POA: Insufficient documentation

## 2017-11-18 DIAGNOSIS — Z91040 Latex allergy status: Secondary | ICD-10-CM | POA: Diagnosis not present

## 2017-11-18 DIAGNOSIS — J45909 Unspecified asthma, uncomplicated: Secondary | ICD-10-CM | POA: Insufficient documentation

## 2017-11-18 DIAGNOSIS — I110 Hypertensive heart disease with heart failure: Secondary | ICD-10-CM | POA: Insufficient documentation

## 2017-11-18 DIAGNOSIS — M329 Systemic lupus erythematosus, unspecified: Secondary | ICD-10-CM | POA: Diagnosis not present

## 2017-11-18 DIAGNOSIS — G8929 Other chronic pain: Secondary | ICD-10-CM | POA: Insufficient documentation

## 2017-11-18 DIAGNOSIS — I509 Heart failure, unspecified: Secondary | ICD-10-CM

## 2017-11-18 DIAGNOSIS — M3210 Systemic lupus erythematosus, organ or system involvement unspecified: Secondary | ICD-10-CM | POA: Diagnosis not present

## 2017-11-18 DIAGNOSIS — R079 Chest pain, unspecified: Secondary | ICD-10-CM | POA: Diagnosis not present

## 2017-11-18 LAB — CBC WITH DIFFERENTIAL/PLATELET
Basophils Absolute: 0 10*3/uL (ref 0.0–0.1)
Basophils Relative: 0 %
Eosinophils Absolute: 0 10*3/uL (ref 0.0–0.7)
Eosinophils Relative: 0 %
HCT: 33.1 % — ABNORMAL LOW (ref 36.0–46.0)
Hemoglobin: 10.6 g/dL — ABNORMAL LOW (ref 12.0–15.0)
Lymphocytes Relative: 2 %
Lymphs Abs: 0.2 10*3/uL — ABNORMAL LOW (ref 0.7–4.0)
MCH: 29.1 pg (ref 26.0–34.0)
MCHC: 32 g/dL (ref 30.0–36.0)
MCV: 90.9 fL (ref 78.0–100.0)
Monocytes Absolute: 0 10*3/uL — ABNORMAL LOW (ref 0.1–1.0)
Monocytes Relative: 0 %
Neutro Abs: 9.7 10*3/uL — ABNORMAL HIGH (ref 1.7–7.7)
Neutrophils Relative %: 98 %
Platelets: 183 10*3/uL (ref 150–400)
RBC: 3.64 MIL/uL — ABNORMAL LOW (ref 3.87–5.11)
RDW: 14.9 % (ref 11.5–15.5)
WBC: 9.9 10*3/uL (ref 4.0–10.5)

## 2017-11-18 LAB — COMPREHENSIVE METABOLIC PANEL
ALT: 15 U/L (ref 14–54)
AST: 24 U/L (ref 15–41)
Albumin: 3.3 g/dL — ABNORMAL LOW (ref 3.5–5.0)
Alkaline Phosphatase: 47 U/L (ref 38–126)
Anion gap: 8 (ref 5–15)
BUN: 16 mg/dL (ref 6–20)
CO2: 25 mmol/L (ref 22–32)
Calcium: 8.6 mg/dL — ABNORMAL LOW (ref 8.9–10.3)
Chloride: 104 mmol/L (ref 101–111)
Creatinine, Ser: 1.04 mg/dL — ABNORMAL HIGH (ref 0.44–1.00)
GFR calc Af Amer: >60 mL/min (ref >60)
GFR calc non Af Amer: >60 mL/min (ref >60)
Glucose, Bld: 241 mg/dL — ABNORMAL HIGH (ref 65–99)
Potassium: 4 mmol/L (ref 3.5–5.1)
Sodium: 137 mmol/L (ref 135–145)
Total Bilirubin: 0.7 mg/dL (ref 0.3–1.2)
Total Protein: 5.7 g/dL — ABNORMAL LOW (ref 6.5–8.1)

## 2017-11-18 LAB — URINALYSIS, ROUTINE W REFLEX MICROSCOPIC
Bilirubin Urine: NEGATIVE
Glucose, UA: >=500 mg/dL — AB
Ketones, ur: NEGATIVE mg/dL
Nitrite: NEGATIVE
Protein, ur: NEGATIVE mg/dL
Specific Gravity, Urine: 1.015 (ref 1.005–1.030)
pH: 6 (ref 5.0–8.0)

## 2017-11-18 LAB — I-STAT BETA HCG BLOOD, ED (MC, WL, AP ONLY): I-stat hCG, quantitative: <5 m[IU]/mL (ref <5)

## 2017-11-18 LAB — I-STAT TROPONIN, ED
Troponin i, poc: 0.01 ng/mL (ref 0.00–0.08)
Troponin i, poc: 0 ng/mL (ref 0.00–0.08)

## 2017-11-18 LAB — BRAIN NATRIURETIC PEPTIDE: B Natriuretic Peptide: 218.6 pg/mL — ABNORMAL HIGH (ref 0.0–100.0)

## 2017-11-18 LAB — D-DIMER, QUANTITATIVE (NOT AT ARMC): D-Dimer, Quant: 0.33 ug/mL-FEU (ref 0.00–0.50)

## 2017-11-18 MED ORDER — FUROSEMIDE 10 MG/ML IJ SOLN
40.0000 mg | Freq: Once | INTRAMUSCULAR | Status: AC
  Administered 2017-11-18: 40 mg via INTRAVENOUS
  Filled 2017-11-18: qty 4

## 2017-11-18 MED ORDER — ONDANSETRON 4 MG PO TBDP: 4.0000 mg | ORAL_TABLET | ORAL | 0 refills | Status: DC | PRN

## 2017-11-18 MED ORDER — ACETAMINOPHEN 500 MG PO TABS
1000.0000 mg | ORAL_TABLET | Freq: Once | ORAL | Status: AC
  Administered 2017-11-18: 1000 mg via ORAL
  Filled 2017-11-18: qty 2

## 2017-11-18 MED ORDER — TRAMADOL HCL 50 MG PO TABS: ORAL_TABLET | ORAL | 0 refills | Status: DC

## 2017-11-18 MED ORDER — ACETAMINOPHEN 500 MG PO TABS: 1000.0000 mg | ORAL_TABLET | Freq: Four times a day (QID) | ORAL | 0 refills | Status: DC | PRN

## 2017-11-18 MED ORDER — ALBUTEROL SULFATE (2.5 MG/3ML) 0.083% IN NEBU
5.0000 mg | INHALATION_SOLUTION | Freq: Once | RESPIRATORY_TRACT | Status: AC
  Administered 2017-11-18: 5 mg via RESPIRATORY_TRACT
  Filled 2017-11-18: qty 6

## 2017-11-18 MED ORDER — TRAMADOL HCL 50 MG PO TABS
100.0000 mg | ORAL_TABLET | Freq: Once | ORAL | Status: AC
  Administered 2017-11-18: 100 mg via ORAL
  Filled 2017-11-18: qty 2

## 2017-11-18 MED ORDER — FLUCONAZOLE 150 MG PO TABS: 150.0000 mg | ORAL_TABLET | Freq: Once | ORAL | 0 refills | Status: AC

## 2017-11-18 NOTE — ED Triage Notes (Signed)
States has had sob   X a couple of days bad this am , states she  Feels like she has yeast from antibiotics,

## 2017-11-18 NOTE — Telephone Encounter (Signed)
Advanced Heart Failure Triage Encounter  Patient Name: Christine Mcdowell  Date of Call: 11/18/17  Problem:  Pt called due to weight of 4 or 5 lbs, but has not been weighing daily. Pt c/o sob (can not say more than two words without breathing heavily over the phone), congested (had pneumonia and HF), and unable to sleep last night. Has not taken metolazone since last week. Directed pt to go to ED, due to sob. Will come to [facility].   Plan:  Chalmers Cater, PA made aware and agreeable to plan.   Shirley Muscat, RN

## 2017-11-18 NOTE — ED Provider Notes (Signed)
[facility] EMERGENCY DEPARTMENT Provider Note   CSN: [id number] Arrival date & time: 11/18/17  1309     History   Chief Complaint Chief Complaint  Patient presents with  . Shortness of Breath    HPI Christine Mcdowell is a 46 y.o. female.  HPI Reports that she was seen on October 31, about a month ago for similar symptoms.  She reports her symptoms today are worse than at her previous visit.  Patient reports that she is short of breath, she has pain and discomfort in her central lower and mid chest.  Pain is with inspiration and cough.  Patient reports significant nighttime cough.  She has not seen any hemoptysis.  She reports she is not bringing up much sputum but feels like her chest is congested.  No fever.  Patient reports she feels like she has congestive heart failure.  She does have prior history of this.  She reports her weight is about 10 pounds over her baseline.  She feels that her abdomen is bloated and she has swelling in her ankles.  Patient did have a knee replacement surgery done in September.  She is wearing a removable knee brace.  Patient reports she been having a lot of generalized pain with her lupus.  She was seen by her rheumatologist approximately a week ago and lab work was obtained. Past Medical History:  Diagnosis Date  . Anemia   . Anginal pain ([place])   . Asthma   . Cervical cancer ([place])   . CHF (congestive heart failure) ([place])   . 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 Discoid lupus   . Fibromyalgia   . History of blood transfusion "several"   "related to anemia; had some w/hysterectomy also"  . Hx of cardiovascular stress test    ETT-Myoview (9/15):  No ischemia, EF 52%; NORMAL  . Hx of echocardiogram    Echo (9/15):  EF 50-55%, ant HK, Gr 1 DD, mild MR, mild LAE, no effusion  . Hypertension   . Iron deficiency anemia    h/o iron transfusions  . Lupus (systemic lupus erythematosus) ([place])   .  Migraine    "a few/year" (07/03/2016)  . Pneumonia 12/2015  . RA (rheumatoid arthritis) ([place])    "all over" (07/03/2016)  . Sickle cell trait ([place])   . Stroke ([place]) 2014 X 1; 2015 X 2; 2016 X 1;    "right side of face more relaxed than the other; rare speech hesitation" (07/03/2016)  . Vaginal Pap smear, abnormal    ASCUS; HPV    Patient Active Problem List   Diagnosis Date Noted  . Migraines 10/08/2017  . Sleep apnea with use of continuous positive airway pressure (CPAP) 09/24/2017  . Congestive heart failure (CHF) ([place]) 10/09/2016  . Respiratory distress 10/09/2016  . Rheumatoid arthritis ([place]) 10/09/2016  . Asthma 10/09/2016  . Chronic pain 10/09/2016  . Heart failure ([place]) 10/09/2016  . Congestive heart failure ([place])   . Chest pain 07/03/2016  . Thrombocytopenia ([place]) 07/03/2016  . Lupus (systemic lupus erythematosus) ([place])   . Diabetes mellitus with complication ([place])   . Anxiety state   . GERD (gastroesophageal reflux disease) 12/28/2015  . Essential hypertension 12/26/2015  . Type 2 diabetes mellitus ([place]) 12/26/2015  . Chronic systolic heart failure ([place]) 08/18/2014  . Cardiomyopathy, dilated ([place]) 01/27/2014  . Hypokalemia 01/27/2014  . Shortness of breath 01/26/2014  . SLE (systemic lupus erythematosus) ([place]) 01/26/2014  Past Surgical History:  Procedure Laterality Date  . ABDOMINAL HYSTERECTOMY  2009  . ABDOMINAL WOUND DEHISCENCE  2009  . CARDIAC CATHETERIZATION N/A 10/11/2016   Procedure: Right/Left Heart Cath and Coronary Angiography;  Surgeon: Jolaine Artist, MD;  Location: [facility];  Service: Cardiovascular;  Laterality: N/A;  . DILATION AND CURETTAGE OF UTERUS  1991  . HEMATOMA EVACUATION  2009   abdomen  . INCISE AND DRAIN ABCESS  2009 X 2   "abdomen after hysterectomy"  . KNEE ARTHROSCOPY Right 1997  . KNEE SURGERY Right   . TUBAL LIGATION  1996    OB History    Gravida Para Term Preterm AB Living   5 4 3 1 1 4    SAB TAB Ectopic  Multiple Live Births   1       4       Home Medications    Prior to Admission medications   Medication Sig Start Date End Date Taking? Authorizing Provider  acetaminophen (TYLENOL) 500 MG tablet Take 1,500 mg by mouth 2 (two) times daily as needed for mild pain or fever.     [provider]  acetaminophen (TYLENOL) 500 MG tablet Take 2 tablets (1,000 mg total) by mouth every 6 (six) hours as needed. 11/18/17   Charlesetta Shanks, MD  ACTEMRA 162 MG/0.9ML SOSY Inject 162 mg into the skin every 14 (fourteen) days. Thursday night around midnight (0000) 11/09/15   [provider]  albuterol (ACCUNEB) 1.25 MG/3ML nebulizer solution Take 3 mLs (1.25 mg total) by nebulization every 6 (six) hours as needed for wheezing. Patient taking differently: Take 1 ampule by nebulization 2 (two) times daily.  03/04/16   Lacretia Leigh, MD  albuterol (PROVENTIL HFA;VENTOLIN HFA) 108 (90 Base) MCG/ACT inhaler Inhale 2 puffs into the lungs daily as needed for shortness of breath.    [provider]  aspirin 81 MG chewable tablet Chew 162 mg by mouth every morning.     [provider]  azithromycin (ZITHROMAX) 250 MG tablet Take 1 tablet (250 mg total) by mouth daily. Take first 2 tablets together, then 1 every day until finished. 10/23/17   Law, Alexandra M, PA-C  BREO ELLIPTA 100-25 MCG/INH AEPB Inhale 1 puff into the lungs daily. 07/16/16   [provider]  carvedilol (COREG) 6.25 MG tablet Take 1 tablet (6.25 mg total) 2 (two) times daily with a meal by mouth. 10/27/17   Clegg, Amy D, NP  cephALEXin (KEFLEX) 500 MG capsule Take 1 capsule (500 mg total) by mouth 3 (three) times daily. 10/23/17   Law, Bea Graff, PA-C  clonazePAM (KLONOPIN) 0.5 MG tablet Take 1 tablet (0.5 mg total) by mouth 3 (three) times daily as needed (Anxiety). 10/14/16   Mikhail, Velta Addison, DO  diphenhydrAMINE (BENADRYL) 25 MG tablet Take 75 mg by mouth 3 (three) times daily.     [provider]    folic acid (FOLVITE) 1 MG tablet TAKE 1 TABLET BY MOUTH EVERY MORNING 01/30/17   Bensimhon, Shaune Pascal, MD  hydrALAZINE (APRESOLINE) 50 MG tablet Take 1.5 tablets (75 mg total) by mouth 3 (three) times daily. 10/08/17   Shirley Friar, PA-C  hydroxychloroquine (PLAQUENIL) 200 MG tablet Take 200 mg by mouth 2 (two) times daily.    [provider]  meloxicam (MOBIC) 15 MG tablet Take 15 mg by mouth daily.     [provider]  methotrexate (RHEUMATREX) 2.5 MG tablet Take 20 mg by mouth once a week. On  Friday 02/06/15   [provider]  metolazone (ZAROXOLYN) 2.5 MG tablet Take 1 tablet (2.5 mg total) by mouth as directed. Take one tablet as needed for weight gain of 5 lbs within 3 days. 09/24/17 12/23/17  Shirley Friar, PA-C  metroNIDAZOLE (METROGEL VAGINAL) 0.75 % vaginal gel Nightly x 5 nights Patient taking differently: Place 1 Applicatorful vaginally See admin instructions. 5 nights a week 10/06/17   Florian Buff, MD  montelukast (SINGULAIR) 10 MG tablet Take 10 mg by mouth daily.  07/11/17   [provider]  Multiple Vitamin (MULTIVITAMIN WITH MINERALS) TABS Take 1 tablet by mouth every morning.     [provider]  ondansetron (ZOFRAN) 4 MG tablet Take 1 tablet (4 mg total) by mouth every 8 (eight) hours as needed for nausea or vomiting. 12/30/15   Robbie Lis, MD  PARoxetine (PAXIL) 10 MG tablet Take 10 mg by mouth daily.  07/28/17   [provider]  potassium chloride SA (K-DUR,KLOR-CON) 20 MEQ tablet Take 2 tablets (40 mEq total) by mouth 3 (three) times daily. 08/22/17   Clegg, Amy D, NP  predniSONE (DELTASONE) 20 MG tablet Take 20 mg by mouth daily with breakfast.     [provider]  sacubitril-valsartan (ENTRESTO) 97-103 MG Take 1 tablet by mouth 2 (two) times daily. 09/24/17   Shirley Friar, PA-C  spironolactone (ALDACTONE) 25 MG tablet Take 1 tablet (25 mg total) by mouth daily. 08/21/17   Clegg, Amy D, NP   torsemide (DEMADEX) 20 MG tablet Take 2 tablets (40 mg total) by mouth 2 (two) times daily. 09/24/17   Shirley Friar, PA-C  traMADol Veatrice [place]) 50 MG tablet 1-2 tablets every 6 hours as needed for pain 11/18/17   Charlesetta Shanks, MD  traZODone (DESYREL) 50 MG tablet Take 1 tablet by mouth at bedtime. 07/24/17   [provider]  TRESIBA FLEXTOUCH 100 UNIT/ML SOPN FlexTouch Pen Inject 10 Units into the skin daily. 07/05/17   [provider]    Family History Family History  Problem Relation Age of Onset  . Arthritis Mother   . Heart murmur Mother   . Drug abuse Mother   . Heart attack Father   . Cushing syndrome Father   . Depression Father   . Dementia Paternal Grandmother   . Cancer Paternal Grandfather   . Diabetes Maternal Grandmother   . Hypertension Maternal Grandmother   . Heart attack Maternal Grandfather     Social History Social History   Tobacco Use  . Smoking status: Never Smoker  . Smokeless tobacco: Never Used  Substance Use Topics  . Alcohol use: No  . Drug use: No     Allergies   Hydromorphone; Iodinated diagnostic agents; Other; Erythromycin; Latex; and Mircette [desogestrel-ethinyl estradiol]   Review of Systems Review of Systems 10 Systems reviewed and are negative for acute change except as noted in the HPI.   Physical Exam Updated Vital Signs BP (!) 156/98   Pulse 86   Temp 98.1 F (36.7 C) (Oral)   Resp 20   Ht 5\' 7"  (1.702 m)   Wt 81.6 kg (180 lb)   SpO2 94%   BMI 28.19 kg/m   Physical Exam  Constitutional: She is oriented to person, place, and time. She appears well-developed and well-nourished. No distress.  Patient appears fatigued but does not have any active respiratory distress at rest.  Mental status is clear.  HENT:  Head: Normocephalic and atraumatic.  Nose: Nose  normal.  Mouth/Throat: Oropharynx is clear and moist.  Eyes: Conjunctivae and EOM are normal.  Neck: Neck supple.  Cardiovascular: Normal  rate, regular rhythm, normal heart sounds and intact distal pulses.  No murmur heard. Pulmonary/Chest: Effort normal and breath sounds normal. No respiratory distress. She has no wheezes. She has no rales.  Abdominal: Soft. She exhibits no distension. There is no tenderness. There is no guarding.  Musculoskeletal: She exhibits edema.  Patient has mild to moderate swelling at both ankles.  Calves are soft but patient endorsed discomfort with compression.  Neurological: She is alert and oriented to person, place, and time. No cranial nerve deficit. She exhibits normal muscle tone. Coordination normal.  Skin: Skin is warm and dry.  Psychiatric: She has a normal mood and affect.  Nursing note and vitals reviewed.    ED Treatments / Results  Labs (all labs ordered are listed, but only abnormal results are displayed) Labs Reviewed  CBC WITH DIFFERENTIAL/PLATELET - Abnormal; Notable for the following components:      Result Value   RBC 3.64 (*)    Hemoglobin 10.6 (*)    HCT 33.1 (*)    Neutro Abs 9.7 (*)    Lymphs Abs 0.2 (*)    Monocytes Absolute 0.0 (*)    All other components within normal limits  COMPREHENSIVE METABOLIC PANEL - Abnormal; Notable for the following components:   Glucose, Bld 241 (*)    Creatinine, Ser 1.04 (*)    Calcium 8.6 (*)    Total Protein 5.7 (*)    Albumin 3.3 (*)    All other components within normal limits  BRAIN NATRIURETIC PEPTIDE - Abnormal; Notable for the following components:   B Natriuretic Peptide 218.6 (*)    All other components within normal limits  URINALYSIS, ROUTINE W REFLEX MICROSCOPIC - Abnormal; Notable for the following components:   APPearance HAZY (*)    Glucose, UA >=500 (*)    Hgb urine dipstick SMALL (*)    Leukocytes, UA TRACE (*)    Bacteria, UA MANY (*)    Squamous Epithelial / LPF 0-5 (*)    All other components within normal limits  D-DIMER, QUANTITATIVE (NOT AT [facility])  I-STAT TROPONIN, ED  I-STAT BETA HCG BLOOD, ED ([facility],  [facility], [facility] ONLY)  I-STAT TROPONIN, ED    EKG  EKG Interpretation  Date/Time:  Tuesday November 18 2017 13:14:35 EST Ventricular Rate:  89 PR Interval:  166 QRS Duration: 78 QT Interval:  358 QTC Calculation: 435 R Axis:   -11 Text Interpretation:  Normal sinus rhythm Possible Left atrial enlargement Nonspecific T wave abnormality Abnormal ECG no change from previous Confirmed by Charlesetta Shanks [phone]) on 11/18/2017 5:23:55 PM       Radiology Dg Chest 2 View  Result Date: 11/18/2017 CLINICAL DATA:  Shortness of breath and chest pain. EXAM: CHEST  2 VIEW COMPARISON:  Chest x-ray dated October 22, 2017. FINDINGS: The cardiopericardial silhouette is mildly enlarged. Normal mediastinal contours. Normal pulmonary vascularity. Patchy opacities in the left lower lobe have resolved. No focal consolidation, pleural effusion, or pneumothorax. No acute osseous abnormality. IMPRESSION: Mild cardiomegaly. Resolved left lower lobe pneumonia. No new consolidation. Electronically Signed   By: Titus Dubin M.D.   On: 11/18/2017 13:58    Procedures Procedures (including critical care time)  Medications Ordered in ED Medications  albuterol (PROVENTIL) (2.5 MG/3ML) 0.083% nebulizer solution 5 mg (5 mg Nebulization Given 11/18/17 1832)  furosemide (LASIX) injection 40 mg (40 mg Intravenous Given  11/18/17 1820)  acetaminophen (TYLENOL) tablet 1,000 mg (1,000 mg Oral Given 11/18/17 2059)  traMADol (ULTRAM) tablet 100 mg (100 mg Oral Given 11/18/17 2059)     Initial Impression / Assessment and Plan / ED Course  I have reviewed the triage vital signs and the nursing notes.  Pertinent labs & imaging results that were available during my care of the patient were reviewed by me and considered in my medical decision making (see chart for details).    Patient is improved prior to discharge.  Pain is improved she feels like abdominal swelling is decreasing and breathing is easier after Lasix. Final  Clinical Impressions(s) / ED Diagnoses   Final diagnoses:  Acute on chronic congestive heart failure, unspecified heart failure type (HCC)  Systemic lupus erythematosus, unspecified SLE type, unspecified organ involvement status ([place])  Other chronic pain  Patient presents as outlined above.  D-dimer is not.  2 sets of troponin without elevation.  EKG consistent with previous.  Chest x-ray does not show gross fluid overload.  Mild elevation in BNP.  Patient was treated Lasix in the emergency department.  She feels improved.  She is also expressing diffuse pain associated with her lupus.  Pain improved with acetaminophen and tramadol.  Patient is stable for discharge.  Vital signs are stable she is clinically well in appearance.  She will have close follow-up with her cardiologist and rheumatologist.  Return precautions reviewed.  ED Discharge Orders        Ordered    acetaminophen (TYLENOL) 500 MG tablet  Every 6 hours PRN     11/18/17 2108    traMADol (ULTRAM) 50 MG tablet     11/18/17 2108       Charlesetta Shanks, MD 11/18/17 2111

## 2017-11-18 NOTE — ED Notes (Signed)
Pt ambulatory to the restroom independently.  

## 2017-11-18 NOTE — ED Notes (Signed)
Main lab to add on d dimer 

## 2017-11-18 NOTE — ED Notes (Signed)
RN called lab to get status on CMP, lab notified nurse in triage that the [place] had hemolyzed

## 2017-11-24 ENCOUNTER — Encounter (HOSPITAL_COMMUNITY): Payer: BLUE CROSS/BLUE SHIELD

## 2017-11-25 ENCOUNTER — Encounter (HOSPITAL_COMMUNITY): Payer: Self-pay | Admitting: Internal Medicine

## 2017-11-27 ENCOUNTER — Telehealth (HOSPITAL_COMMUNITY): Payer: Self-pay | Admitting: *Deleted

## 2017-11-27 DIAGNOSIS — E119 Type 2 diabetes mellitus without complications: Secondary | ICD-10-CM | POA: Diagnosis not present

## 2017-11-27 DIAGNOSIS — Z8709 Personal history of other diseases of the respiratory system: Secondary | ICD-10-CM | POA: Diagnosis not present

## 2017-11-27 DIAGNOSIS — J189 Pneumonia, unspecified organism: Secondary | ICD-10-CM | POA: Diagnosis not present

## 2017-11-27 DIAGNOSIS — F329 Major depressive disorder, single episode, unspecified: Secondary | ICD-10-CM | POA: Diagnosis not present

## 2017-11-27 DIAGNOSIS — M329 Systemic lupus erythematosus, unspecified: Secondary | ICD-10-CM | POA: Diagnosis not present

## 2017-11-27 NOTE — Telephone Encounter (Signed)
Attempted to reach patient to get further information (per [facility]) but no answer and no VM set up.  See note below from patient:  I have taken the antibiotics again for the pneumonia.As soon as I am done with them it comes back.I am gaining 2-3 pounds a day. I'm taking the metalazone to pull it off. However I'm short of breath and getting that full feeling in my abdomen again. We caught it early last time. I'm feeling like I am in mild CHF again.The oral antibiotics don't seem to be clearing the pneumonia up.I've been diagnosed with it 3 times recently. As soon as I'm done with the antibiotics are done....it begins to come back. Can we do an admission for a few days and IV antibiotics and check the chf?Is there any update on the device approval?

## 2017-11-28 DIAGNOSIS — M25861 Other specified joint disorders, right knee: Secondary | ICD-10-CM | POA: Diagnosis not present

## 2017-11-28 DIAGNOSIS — Z4789 Encounter for other orthopedic aftercare: Secondary | ICD-10-CM | POA: Diagnosis not present

## 2017-12-04 DIAGNOSIS — N390 Urinary tract infection, site not specified: Secondary | ICD-10-CM | POA: Diagnosis not present

## 2017-12-04 DIAGNOSIS — I110 Hypertensive heart disease with heart failure: Secondary | ICD-10-CM | POA: Diagnosis not present

## 2017-12-04 DIAGNOSIS — R319 Hematuria, unspecified: Secondary | ICD-10-CM | POA: Diagnosis not present

## 2017-12-04 DIAGNOSIS — G4733 Obstructive sleep apnea (adult) (pediatric): Secondary | ICD-10-CM | POA: Diagnosis not present

## 2017-12-04 DIAGNOSIS — E119 Type 2 diabetes mellitus without complications: Secondary | ICD-10-CM | POA: Diagnosis not present

## 2017-12-04 DIAGNOSIS — Z87440 Personal history of urinary (tract) infections: Secondary | ICD-10-CM | POA: Diagnosis not present

## 2017-12-04 DIAGNOSIS — L930 Discoid lupus erythematosus: Secondary | ICD-10-CM | POA: Diagnosis not present

## 2017-12-04 DIAGNOSIS — B9620 Unspecified Escherichia coli [E. coli] as the cause of diseases classified elsewhere: Secondary | ICD-10-CM | POA: Diagnosis not present

## 2017-12-04 DIAGNOSIS — R0602 Shortness of breath: Secondary | ICD-10-CM | POA: Diagnosis not present

## 2017-12-04 DIAGNOSIS — Z885 Allergy status to narcotic agent status: Secondary | ICD-10-CM | POA: Diagnosis not present

## 2017-12-04 DIAGNOSIS — Z79899 Other long term (current) drug therapy: Secondary | ICD-10-CM | POA: Diagnosis not present

## 2017-12-04 DIAGNOSIS — Z8673 Personal history of transient ischemic attack (TIA), and cerebral infarction without residual deficits: Secondary | ICD-10-CM | POA: Diagnosis not present

## 2017-12-04 DIAGNOSIS — Z794 Long term (current) use of insulin: Secondary | ICD-10-CM | POA: Diagnosis not present

## 2017-12-04 DIAGNOSIS — Z7982 Long term (current) use of aspirin: Secondary | ICD-10-CM | POA: Diagnosis not present

## 2017-12-04 DIAGNOSIS — R0600 Dyspnea, unspecified: Secondary | ICD-10-CM | POA: Diagnosis not present

## 2017-12-04 DIAGNOSIS — M069 Rheumatoid arthritis, unspecified: Secondary | ICD-10-CM | POA: Diagnosis not present

## 2017-12-04 DIAGNOSIS — M797 Fibromyalgia: Secondary | ICD-10-CM | POA: Diagnosis not present

## 2017-12-04 DIAGNOSIS — R079 Chest pain, unspecified: Secondary | ICD-10-CM | POA: Diagnosis not present

## 2017-12-04 DIAGNOSIS — I509 Heart failure, unspecified: Secondary | ICD-10-CM | POA: Diagnosis not present

## 2017-12-05 DIAGNOSIS — G4733 Obstructive sleep apnea (adult) (pediatric): Secondary | ICD-10-CM | POA: Diagnosis not present

## 2017-12-10 DIAGNOSIS — J45909 Unspecified asthma, uncomplicated: Secondary | ICD-10-CM | POA: Diagnosis not present

## 2017-12-10 DIAGNOSIS — Z96651 Presence of right artificial knee joint: Secondary | ICD-10-CM | POA: Diagnosis not present

## 2017-12-10 DIAGNOSIS — R319 Hematuria, unspecified: Secondary | ICD-10-CM | POA: Diagnosis not present

## 2017-12-10 DIAGNOSIS — M069 Rheumatoid arthritis, unspecified: Secondary | ICD-10-CM | POA: Diagnosis not present

## 2017-12-10 DIAGNOSIS — N390 Urinary tract infection, site not specified: Secondary | ICD-10-CM | POA: Diagnosis not present

## 2017-12-10 DIAGNOSIS — I110 Hypertensive heart disease with heart failure: Secondary | ICD-10-CM | POA: Diagnosis not present

## 2017-12-10 DIAGNOSIS — Z7982 Long term (current) use of aspirin: Secondary | ICD-10-CM | POA: Diagnosis not present

## 2017-12-10 DIAGNOSIS — L930 Discoid lupus erythematosus: Secondary | ICD-10-CM | POA: Diagnosis not present

## 2017-12-10 DIAGNOSIS — E119 Type 2 diabetes mellitus without complications: Secondary | ICD-10-CM | POA: Diagnosis not present

## 2017-12-10 DIAGNOSIS — I509 Heart failure, unspecified: Secondary | ICD-10-CM | POA: Diagnosis not present

## 2017-12-12 DIAGNOSIS — N390 Urinary tract infection, site not specified: Secondary | ICD-10-CM | POA: Diagnosis not present

## 2017-12-12 DIAGNOSIS — I509 Heart failure, unspecified: Secondary | ICD-10-CM | POA: Diagnosis not present

## 2017-12-12 DIAGNOSIS — E119 Type 2 diabetes mellitus without complications: Secondary | ICD-10-CM | POA: Diagnosis not present

## 2017-12-12 DIAGNOSIS — M069 Rheumatoid arthritis, unspecified: Secondary | ICD-10-CM | POA: Diagnosis not present

## 2017-12-12 DIAGNOSIS — L930 Discoid lupus erythematosus: Secondary | ICD-10-CM | POA: Diagnosis not present

## 2017-12-12 DIAGNOSIS — I110 Hypertensive heart disease with heart failure: Secondary | ICD-10-CM | POA: Diagnosis not present

## 2017-12-12 DIAGNOSIS — Z96651 Presence of right artificial knee joint: Secondary | ICD-10-CM | POA: Diagnosis not present

## 2017-12-12 DIAGNOSIS — R319 Hematuria, unspecified: Secondary | ICD-10-CM | POA: Diagnosis not present

## 2017-12-12 DIAGNOSIS — J45909 Unspecified asthma, uncomplicated: Secondary | ICD-10-CM | POA: Diagnosis not present

## 2017-12-12 DIAGNOSIS — Z7982 Long term (current) use of aspirin: Secondary | ICD-10-CM | POA: Diagnosis not present

## 2017-12-15 ENCOUNTER — Ambulatory Visit (HOSPITAL_COMMUNITY)
Admission: RE | Admit: 2017-12-15 | Discharge: 2017-12-15 | Disposition: A | Discharge status: Home / Self Care | Payer: BLUE CROSS/BLUE SHIELD | Source: Ambulatory Visit | Attending: Internal Medicine | Admitting: Internal Medicine

## 2017-12-15 ENCOUNTER — Other Ambulatory Visit: Payer: Self-pay

## 2017-12-15 ENCOUNTER — Ambulatory Visit (HOSPITAL_BASED_OUTPATIENT_CLINIC_OR_DEPARTMENT_OTHER)
Admission: RE | Admit: 2017-12-15 | Discharge: 2017-12-15 | Disposition: A | Discharge status: Home / Self Care | Payer: BLUE CROSS/BLUE SHIELD | Source: Ambulatory Visit | Attending: Internal Medicine | Admitting: Internal Medicine

## 2017-12-15 ENCOUNTER — Encounter (HOSPITAL_COMMUNITY): Payer: Self-pay | Admitting: Internal Medicine

## 2017-12-15 VITALS — BP 148/84 | HR 84 | Wt 180.2 lb

## 2017-12-15 DIAGNOSIS — G43909 Migraine, unspecified, not intractable, without status migrainosus: Secondary | ICD-10-CM | POA: Diagnosis not present

## 2017-12-15 DIAGNOSIS — Z8673 Personal history of transient ischemic attack (TIA), and cerebral infarction without residual deficits: Secondary | ICD-10-CM | POA: Insufficient documentation

## 2017-12-15 DIAGNOSIS — I340 Nonrheumatic mitral (valve) insufficiency: Secondary | ICD-10-CM | POA: Insufficient documentation

## 2017-12-15 DIAGNOSIS — G4733 Obstructive sleep apnea (adult) (pediatric): Secondary | ICD-10-CM | POA: Insufficient documentation

## 2017-12-15 DIAGNOSIS — Z79899 Other long term (current) drug therapy: Secondary | ICD-10-CM | POA: Insufficient documentation

## 2017-12-15 DIAGNOSIS — Z7952 Long term (current) use of systemic steroids: Secondary | ICD-10-CM | POA: Insufficient documentation

## 2017-12-15 DIAGNOSIS — D688 Other specified coagulation defects: Secondary | ICD-10-CM | POA: Insufficient documentation

## 2017-12-15 DIAGNOSIS — I429 Cardiomyopathy, unspecified: Secondary | ICD-10-CM | POA: Insufficient documentation

## 2017-12-15 DIAGNOSIS — E119 Type 2 diabetes mellitus without complications: Secondary | ICD-10-CM | POA: Diagnosis not present

## 2017-12-15 DIAGNOSIS — M329 Systemic lupus erythematosus, unspecified: Secondary | ICD-10-CM | POA: Insufficient documentation

## 2017-12-15 DIAGNOSIS — I5022 Chronic systolic (congestive) heart failure: Secondary | ICD-10-CM

## 2017-12-15 DIAGNOSIS — E785 Hyperlipidemia, unspecified: Secondary | ICD-10-CM | POA: Insufficient documentation

## 2017-12-15 DIAGNOSIS — I110 Hypertensive heart disease with heart failure: Secondary | ICD-10-CM | POA: Diagnosis not present

## 2017-12-15 DIAGNOSIS — M797 Fibromyalgia: Secondary | ICD-10-CM | POA: Diagnosis not present

## 2017-12-15 NOTE — Patient Instructions (Signed)
Your physician recommends that you schedule a follow-up appointment in: 3 months.  

## 2017-12-15 NOTE — Progress Notes (Signed)
  Echocardiogram 2D Echocardiogram has been performed.  Jennette Dubin 12/15/2017, 11:02 AM

## 2017-12-15 NOTE — Progress Notes (Signed)
Advanced Heart Failure Clinic Note    Primary Cardiologist:  Dr Johnsie Cancel  Rheumatologist: Dr Eliberto Ivory  HF: Dr. Haroldine Laws   HPI: Christine Mcdowell is a 46 y.o. 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 S/P R ACL repair 08/2017.   Admitted in October 2017 with increased dyspnea and volume overload. Diuresed with IV lasix and transitioned to lasix 40 mg twice a day. LHC/RHC normal filling pressures, EF ~20%, and normal coronaries. Discharge weight 184 pounds.   Admitted to [facility] in September for ACL repair. Also received IV lasix due hospital admit.   Today she returns for HF followed. Since the last visit she has been hospitalized twice for pneumonia. Remains SOB with exertion. + Orthopnea. Denies PND. Weight at home 180 pounds. Has not had her medications today. Says she has been using sliding scale lasix more regularly and has felt better last few weeks. Weight more stable.   Cardiac MRI in 02/2015 showed EF 46%. No LGE.  ECHO today --->EF 25-30% RV ok. Reviewed personally  CPX 10/2016 Peak VO2: 18.5 (82% predicted peak VO2) VE/VCO2 slope: 30 OUES: 1.75 Peak RER: 1.14.  RHC/LHC 10/11/2016  RA = 4 RV = 24/5 PA = 23/4 (16) PCW = 9 Fick cardiac output/index = 4.4/2.2 SVR = 1580 PVR =  1.5 WU Ao sat = 95% PA sat = 58%, 62% Assessment: 1. Normal coronary arteries with separate ostial for LAD and LCX 2. Normal right heart pressures 3. NICM with EF ~20% by echo  Review of systems complete and found to be negative unless listed in HPI.    SH:  Social History   Socioeconomic History  . Marital status: Divorced    Spouse name: Not on file  . Number of children: Not on file  . Years of education: Not on file  . Highest education level: Not on file  Social Needs  . Financial resource strain: Not on file  . Food  insecurity - worry: Not on file  . Food insecurity - inability: Not on file  . Transportation needs - medical: Not on file  . Transportation needs - non-medical: Not on file  Occupational History  . Not on file  Tobacco Use  . Smoking status: Never Smoker  . Smokeless tobacco: Never Used  Substance and Sexual Activity  . Alcohol use: No  . Drug use: No  . Sexual activity: Not Currently    Birth control/protection: Surgical    Comment: hyst  Other Topics Concern  . Not on file  Social History Narrative  . Not on file   FH:  Family History  Problem Relation Age of Onset  . Arthritis Mother   . Heart murmur Mother   . Drug abuse Mother   . Heart attack Father   . Cushing syndrome Father   . Depression Father   . Dementia Paternal Grandmother   . Cancer Paternal Grandfather   . Diabetes Maternal Grandmother   . Hypertension Maternal Grandmother   . Heart attack Maternal Grandfather    Past Medical History:  Diagnosis Date  . Anemia   . Anginal pain ([place])   . Asthma   . Cervical cancer ([place])   . CHF (congestive heart failure) ([place])   . Diabetes mellitus without complication ([place])    steroid induced  . Discoid lupus   . Fibromyalgia   . History of blood  transfusion "several"   "related to anemia; had some w/hysterectomy also"  . Hx of cardiovascular stress test    ETT-Myoview (9/15):  No ischemia, EF 52%; NORMAL  . Hx of echocardiogram    Echo (9/15):  EF 50-55%, ant HK, Gr 1 DD, mild MR, mild LAE, no effusion  . Hypertension   . Iron deficiency anemia    h/o iron transfusions  . Lupus (systemic lupus erythematosus) ([place])   . Migraine    "a few/year" (07/03/2016)  . Pneumonia 12/2015  . RA (rheumatoid arthritis) ([place])    "all over" (07/03/2016)  . Sickle cell trait ([place])   . Stroke ([place]) 2014 X 1; 2015 X 2; 2016 X 1;    "right side of face more relaxed than the other; rare speech hesitation" (07/03/2016)  . Vaginal Pap smear, abnormal    ASCUS; HPV    Current  Outpatient Medications  Medication Sig Dispense Refill  . acetaminophen (TYLENOL) 500 MG tablet Take 1,500 mg by mouth 2 (two) times daily as needed for mild pain or fever.     Marland Kitchen acetaminophen (TYLENOL) 500 MG tablet Take 2 tablets (1,000 mg total) by mouth every 6 (six) hours as needed. 30 tablet 0  . ACTEMRA 162 MG/0.9ML SOSY Inject 162 mg into the skin every 14 (fourteen) days. Thursday night around midnight (0000)    . albuterol (ACCUNEB) 1.25 MG/3ML nebulizer solution Take 3 mLs (1.25 mg total) by nebulization every 6 (six) hours as needed for wheezing. (Patient taking differently: Take 1 ampule by nebulization 2 (two) times daily. ) 75 mL 12  . albuterol (PROVENTIL HFA;VENTOLIN HFA) 108 (90 Base) MCG/ACT inhaler Inhale 2 puffs into the lungs daily as needed for shortness of breath.    Marland Kitchen aspirin 81 MG chewable tablet Chew 162 mg by mouth every morning.     Marland Kitchen BREO ELLIPTA 100-25 MCG/INH AEPB Inhale 1 puff into the lungs daily.  2  . carvedilol (COREG) 6.25 MG tablet Take 1 tablet (6.25 mg total) 2 (two) times daily with a meal by mouth. 60 tablet 3  . cephALEXin (KEFLEX) 500 MG capsule Take 1 capsule (500 mg total) by mouth 3 (three) times daily. 21 capsule 0  . ciprofloxacin (CIPRO) 500 MG tablet Take 500 mg by mouth 2 (two) times daily.  0  . clonazePAM (KLONOPIN) 0.5 MG tablet Take 1 tablet (0.5 mg total) by mouth 3 (three) times daily as needed (Anxiety). 45 tablet 0  . diphenhydrAMINE (BENADRYL) 25 MG tablet Take 75 mg by mouth 3 (three) times daily.     . folic acid (FOLVITE) 1 MG tablet TAKE 1 TABLET BY MOUTH EVERY MORNING 30 tablet 0  . hydrALAZINE (APRESOLINE) 50 MG tablet Take 1.5 tablets (75 mg total) by mouth 3 (three) times daily. 135 tablet 6  . hydroxychloroquine (PLAQUENIL) 200 MG tablet Take 200 mg by mouth 2 (two) times daily.    . meloxicam (MOBIC) 15 MG tablet Take 15 mg by mouth daily.     . methotrexate (RHEUMATREX) 2.5 MG tablet Take 20 mg by mouth once a week. On Friday   3  . metolazone (ZAROXOLYN) 2.5 MG tablet Take 1 tablet (2.5 mg total) by mouth as directed. Take one tablet as needed for weight gain of 5 lbs within 3 days. 5 tablet 1  . metroNIDAZOLE (METROGEL VAGINAL) 0.75 % vaginal gel Nightly x 5 nights (Patient taking differently: Place 1 Applicatorful vaginally See admin instructions. 5 nights a week) 70 g 0  .  montelukast (SINGULAIR) 10 MG tablet Take 10 mg by mouth daily.   3  . Multiple Vitamin (MULTIVITAMIN WITH MINERALS) TABS Take 1 tablet by mouth every morning.     . ondansetron (ZOFRAN) 4 MG tablet Take 1 tablet (4 mg total) by mouth every 8 (eight) hours as needed for nausea or vomiting. 20 tablet 0  . PARoxetine (PAXIL) 10 MG tablet Take 10 mg by mouth daily.   0  . potassium chloride SA (K-DUR,KLOR-CON) 20 MEQ tablet Take 2 tablets (40 mEq total) by mouth 3 (three) times daily. 180 tablet 6  . predniSONE (DELTASONE) 20 MG tablet Take 20 mg by mouth daily with breakfast.     . sacubitril-valsartan (ENTRESTO) 97-103 MG Take 1 tablet by mouth 2 (two) times daily. 60 tablet 3  . spironolactone (ALDACTONE) 25 MG tablet Take 1 tablet (25 mg total) by mouth daily. 30 tablet 6  . torsemide (DEMADEX) 20 MG tablet Take 2 tablets (40 mg total) by mouth 2 (two) times daily. 120 tablet 6  . traMADol (ULTRAM) 50 MG tablet 1-2 tablets every 6 hours as needed for pain 20 tablet 0  . traZODone (DESYREL) 50 MG tablet Take 1 tablet by mouth at bedtime.  5  . TRESIBA FLEXTOUCH 100 UNIT/ML SOPN FlexTouch Pen Inject 10 Units into the skin daily.  0   No current facility-administered medications for this encounter.    Vitals:   12/15/17 1105  BP: (!) 148/84  Pulse: 84  SpO2: 100%  Weight: 180 lb 4 oz (81.8 kg)     Wt Readings from Last 3 Encounters:  12/15/17 180 lb 4 oz (81.8 kg)  11/18/17 180 lb (81.6 kg)  10/27/17 179 lb (81.2 kg)   PHYSICAL EXAM: General:  Well appearing. No resp difficulty HEENT: normal Neck: supple. no JVD. Carotids 2+ bilat; no  bruits. No lymphadenopathy or thryomegaly appreciated. Cor: PMI laterallydisplaced. Regular rate & rhythm. No rubs, gallops or murmurs. Lungs: clear Abdomen: obese soft, nontender, nondistended. No hepatosplenomegaly. No bruits or masses. Good bowel sounds. Extremities: no cyanosis, clubbing, rash, edema RLE brace  Neuro: alert & orientedx3, cranial nerves grossly intact. moves all 4 extremities w/o difficulty. Affect pleasant   ASSESSMENT & PLAN: 1. Chronic Systolic Heart Failure - NICM Cath 10/11/16 with normal cors --Cardiac MRI in 02/2015 showed EF 46%. No LGE. --CPX with very mild HF limitation and no high-risk features. - ECHO today EF 25-30% Personally reviewed NYHA III.  -Volulme status stable. Continue torsemide 40 mg twice a day  - Continue torsemide 40 mg BID.  - Continue metolazone as needed. Reinforced need for daily weights and reviewed use of sliding scale diuretics - Continue spiro  25 mg daily  - Continue coreg 6.25 mg BID.  - Continue entresto 97/103 mg BID. - Continue hydralazine 75 mg TID. Not on imdur due to headaches.  2.  Lupus  - on chronic prednisone  3. HTN - Stable.  4. OSA -Contiue nightly CPAP.  5. Migraines -Stable today.    6. Hypertension Elevated but did not have medications today.  7. Hypofibrinogenemia- followed at [facility]  Plan to set up cardiomems. She has been consented.   Follow up 2-3 months.  Darrick Grinder, NP  11:49 AM   Patient seen and examined with Darrick Grinder, NP. We discussed all aspects of the encounter. I agree with the assessment and plan as stated above.   Overall stable NYHA III symptoms. Volume status improved but still quite labile.. Echo today  shows persistent severe LV dysfunction of unclear etiology. EF 25-30%. Planning cardiomems placement but also need referral for ICD, Will refer for Heart Logic device.  Glori Bickers, MD  7:40 PM

## 2017-12-31 ENCOUNTER — Telehealth (HOSPITAL_COMMUNITY): Payer: Self-pay | Admitting: *Deleted

## 2017-12-31 DIAGNOSIS — E119 Type 2 diabetes mellitus without complications: Secondary | ICD-10-CM | POA: Diagnosis not present

## 2017-12-31 DIAGNOSIS — G43909 Migraine, unspecified, not intractable, without status migrainosus: Secondary | ICD-10-CM | POA: Diagnosis not present

## 2017-12-31 DIAGNOSIS — I110 Hypertensive heart disease with heart failure: Secondary | ICD-10-CM | POA: Diagnosis not present

## 2017-12-31 DIAGNOSIS — M329 Systemic lupus erythematosus, unspecified: Secondary | ICD-10-CM | POA: Diagnosis not present

## 2017-12-31 DIAGNOSIS — D688 Other specified coagulation defects: Secondary | ICD-10-CM | POA: Diagnosis not present

## 2017-12-31 DIAGNOSIS — I7300 Raynaud's syndrome without gangrene: Secondary | ICD-10-CM | POA: Diagnosis not present

## 2017-12-31 DIAGNOSIS — M25561 Pain in right knee: Secondary | ICD-10-CM | POA: Diagnosis not present

## 2017-12-31 DIAGNOSIS — I420 Dilated cardiomyopathy: Secondary | ICD-10-CM | POA: Diagnosis not present

## 2017-12-31 DIAGNOSIS — I5022 Chronic systolic (congestive) heart failure: Secondary | ICD-10-CM

## 2017-12-31 DIAGNOSIS — G8929 Other chronic pain: Secondary | ICD-10-CM | POA: Diagnosis not present

## 2017-12-31 DIAGNOSIS — Z9889 Other specified postprocedural states: Secondary | ICD-10-CM | POA: Diagnosis not present

## 2017-12-31 DIAGNOSIS — M25861 Other specified joint disorders, right knee: Secondary | ICD-10-CM | POA: Diagnosis not present

## 2017-12-31 DIAGNOSIS — S83511A Sprain of anterior cruciate ligament of right knee, initial encounter: Secondary | ICD-10-CM | POA: Diagnosis not present

## 2017-12-31 DIAGNOSIS — M0589 Other rheumatoid arthritis with rheumatoid factor of multiple sites: Secondary | ICD-10-CM | POA: Diagnosis not present

## 2017-12-31 DIAGNOSIS — M7121 Synovial cyst of popliteal space [Baker], right knee: Secondary | ICD-10-CM | POA: Diagnosis not present

## 2017-12-31 DIAGNOSIS — M65861 Other synovitis and tenosynovitis, right lower leg: Secondary | ICD-10-CM | POA: Diagnosis not present

## 2017-12-31 DIAGNOSIS — I5041 Acute combined systolic (congestive) and diastolic (congestive) heart failure: Secondary | ICD-10-CM | POA: Diagnosis not present

## 2017-12-31 DIAGNOSIS — G4733 Obstructive sleep apnea (adult) (pediatric): Secondary | ICD-10-CM | POA: Diagnosis not present

## 2017-12-31 DIAGNOSIS — J45909 Unspecified asthma, uncomplicated: Secondary | ICD-10-CM | POA: Diagnosis not present

## 2017-12-31 DIAGNOSIS — M797 Fibromyalgia: Secondary | ICD-10-CM | POA: Diagnosis not present

## 2017-12-31 DIAGNOSIS — M94261 Chondromalacia, right knee: Secondary | ICD-10-CM | POA: Diagnosis not present

## 2017-12-31 NOTE — Telephone Encounter (Signed)
Pt aware and agreeable to proceed w/icd implant, referral placed

## 2017-12-31 NOTE — Telephone Encounter (Signed)
Attempted to call pt to inform her of new plan Left message to call back

## 2017-12-31 NOTE — Telephone Encounter (Signed)
-----   Message from Jolaine Artist, MD sent at 12/15/2017  7:41 PM EST ----- Nira Conn - Prior to getting cardiomems approved let's refer to EP (Dr. Lovena Le) for Leith ICD. Thanks - db

## 2018-01-02 ENCOUNTER — Encounter (HOSPITAL_COMMUNITY): Payer: Self-pay | Admitting: Internal Medicine

## 2018-01-02 NOTE — Telephone Encounter (Signed)
I was notified by the operator to call the patient. She reports that she has been experiencing lightheadedness with relatively low blood pressure (SBP of 100 from baseline of 150s) with significant fatigue. She has also had intermittent sharp L sided chest pain. She states that she will plan to come to the ED for evaluation of her symptoms.

## 2018-01-05 ENCOUNTER — Telehealth (HOSPITAL_COMMUNITY): Payer: Self-pay | Admitting: *Deleted

## 2018-01-05 DIAGNOSIS — D688 Other specified coagulation defects: Secondary | ICD-10-CM | POA: Diagnosis not present

## 2018-01-05 DIAGNOSIS — G4733 Obstructive sleep apnea (adult) (pediatric): Secondary | ICD-10-CM | POA: Diagnosis not present

## 2018-01-05 NOTE — Telephone Encounter (Signed)
Message given to Dr.Bensimhons nurse for him to address.

## 2018-01-06 DIAGNOSIS — I7300 Raynaud's syndrome without gangrene: Secondary | ICD-10-CM | POA: Diagnosis not present

## 2018-01-06 DIAGNOSIS — M7121 Synovial cyst of popliteal space [Baker], right knee: Secondary | ICD-10-CM | POA: Diagnosis not present

## 2018-01-06 DIAGNOSIS — M0589 Other rheumatoid arthritis with rheumatoid factor of multiple sites: Secondary | ICD-10-CM | POA: Diagnosis not present

## 2018-01-06 DIAGNOSIS — I110 Hypertensive heart disease with heart failure: Secondary | ICD-10-CM | POA: Diagnosis not present

## 2018-01-06 DIAGNOSIS — E119 Type 2 diabetes mellitus without complications: Secondary | ICD-10-CM | POA: Diagnosis not present

## 2018-01-06 DIAGNOSIS — G4733 Obstructive sleep apnea (adult) (pediatric): Secondary | ICD-10-CM | POA: Diagnosis not present

## 2018-01-06 DIAGNOSIS — G8929 Other chronic pain: Secondary | ICD-10-CM | POA: Diagnosis not present

## 2018-01-06 DIAGNOSIS — M25561 Pain in right knee: Secondary | ICD-10-CM | POA: Diagnosis not present

## 2018-01-06 DIAGNOSIS — M94261 Chondromalacia, right knee: Secondary | ICD-10-CM | POA: Diagnosis not present

## 2018-01-06 DIAGNOSIS — M329 Systemic lupus erythematosus, unspecified: Secondary | ICD-10-CM | POA: Diagnosis not present

## 2018-01-06 DIAGNOSIS — I420 Dilated cardiomyopathy: Secondary | ICD-10-CM | POA: Diagnosis not present

## 2018-01-06 DIAGNOSIS — G43909 Migraine, unspecified, not intractable, without status migrainosus: Secondary | ICD-10-CM | POA: Diagnosis not present

## 2018-01-06 DIAGNOSIS — J45909 Unspecified asthma, uncomplicated: Secondary | ICD-10-CM | POA: Diagnosis not present

## 2018-01-06 DIAGNOSIS — M797 Fibromyalgia: Secondary | ICD-10-CM | POA: Diagnosis not present

## 2018-01-06 DIAGNOSIS — I5041 Acute combined systolic (congestive) and diastolic (congestive) heart failure: Secondary | ICD-10-CM | POA: Diagnosis not present

## 2018-01-06 DIAGNOSIS — M65861 Other synovitis and tenosynovitis, right lower leg: Secondary | ICD-10-CM | POA: Diagnosis not present

## 2018-01-06 DIAGNOSIS — D688 Other specified coagulation defects: Secondary | ICD-10-CM | POA: Diagnosis not present

## 2018-01-07 DIAGNOSIS — M0589 Other rheumatoid arthritis with rheumatoid factor of multiple sites: Secondary | ICD-10-CM | POA: Diagnosis not present

## 2018-01-07 DIAGNOSIS — G4733 Obstructive sleep apnea (adult) (pediatric): Secondary | ICD-10-CM | POA: Diagnosis not present

## 2018-01-07 DIAGNOSIS — I5041 Acute combined systolic (congestive) and diastolic (congestive) heart failure: Secondary | ICD-10-CM | POA: Diagnosis not present

## 2018-01-07 DIAGNOSIS — M65861 Other synovitis and tenosynovitis, right lower leg: Secondary | ICD-10-CM | POA: Diagnosis not present

## 2018-01-07 DIAGNOSIS — M329 Systemic lupus erythematosus, unspecified: Secondary | ICD-10-CM | POA: Diagnosis not present

## 2018-01-07 DIAGNOSIS — G43909 Migraine, unspecified, not intractable, without status migrainosus: Secondary | ICD-10-CM | POA: Diagnosis not present

## 2018-01-07 DIAGNOSIS — E119 Type 2 diabetes mellitus without complications: Secondary | ICD-10-CM | POA: Diagnosis not present

## 2018-01-07 DIAGNOSIS — M7121 Synovial cyst of popliteal space [Baker], right knee: Secondary | ICD-10-CM | POA: Diagnosis not present

## 2018-01-07 DIAGNOSIS — I420 Dilated cardiomyopathy: Secondary | ICD-10-CM | POA: Diagnosis not present

## 2018-01-07 DIAGNOSIS — D688 Other specified coagulation defects: Secondary | ICD-10-CM | POA: Diagnosis not present

## 2018-01-07 DIAGNOSIS — I7300 Raynaud's syndrome without gangrene: Secondary | ICD-10-CM | POA: Diagnosis not present

## 2018-01-07 DIAGNOSIS — J45909 Unspecified asthma, uncomplicated: Secondary | ICD-10-CM | POA: Diagnosis not present

## 2018-01-07 DIAGNOSIS — M797 Fibromyalgia: Secondary | ICD-10-CM | POA: Diagnosis not present

## 2018-01-07 DIAGNOSIS — I110 Hypertensive heart disease with heart failure: Secondary | ICD-10-CM | POA: Diagnosis not present

## 2018-01-07 DIAGNOSIS — M94261 Chondromalacia, right knee: Secondary | ICD-10-CM | POA: Diagnosis not present

## 2018-01-07 DIAGNOSIS — G8929 Other chronic pain: Secondary | ICD-10-CM | POA: Diagnosis not present

## 2018-01-08 ENCOUNTER — Other Ambulatory Visit: Payer: Self-pay | Admitting: Obstetrics & Gynecology

## 2018-01-08 DIAGNOSIS — N390 Urinary tract infection, site not specified: Secondary | ICD-10-CM | POA: Diagnosis not present

## 2018-01-08 DIAGNOSIS — J45909 Unspecified asthma, uncomplicated: Secondary | ICD-10-CM | POA: Diagnosis not present

## 2018-01-08 DIAGNOSIS — Z96651 Presence of right artificial knee joint: Secondary | ICD-10-CM | POA: Diagnosis not present

## 2018-01-08 DIAGNOSIS — I110 Hypertensive heart disease with heart failure: Secondary | ICD-10-CM | POA: Diagnosis not present

## 2018-01-08 DIAGNOSIS — R319 Hematuria, unspecified: Secondary | ICD-10-CM | POA: Diagnosis not present

## 2018-01-14 DIAGNOSIS — Z7982 Long term (current) use of aspirin: Secondary | ICD-10-CM | POA: Diagnosis not present

## 2018-01-14 DIAGNOSIS — N390 Urinary tract infection, site not specified: Secondary | ICD-10-CM | POA: Diagnosis not present

## 2018-01-14 DIAGNOSIS — R319 Hematuria, unspecified: Secondary | ICD-10-CM | POA: Diagnosis not present

## 2018-01-14 DIAGNOSIS — L930 Discoid lupus erythematosus: Secondary | ICD-10-CM | POA: Diagnosis not present

## 2018-01-14 DIAGNOSIS — J45909 Unspecified asthma, uncomplicated: Secondary | ICD-10-CM | POA: Diagnosis not present

## 2018-01-14 DIAGNOSIS — E119 Type 2 diabetes mellitus without complications: Secondary | ICD-10-CM | POA: Diagnosis not present

## 2018-01-14 DIAGNOSIS — I110 Hypertensive heart disease with heart failure: Secondary | ICD-10-CM | POA: Diagnosis not present

## 2018-01-14 DIAGNOSIS — I509 Heart failure, unspecified: Secondary | ICD-10-CM | POA: Diagnosis not present

## 2018-01-14 DIAGNOSIS — M069 Rheumatoid arthritis, unspecified: Secondary | ICD-10-CM | POA: Diagnosis not present

## 2018-01-14 DIAGNOSIS — Z96651 Presence of right artificial knee joint: Secondary | ICD-10-CM | POA: Diagnosis not present

## 2018-01-15 DIAGNOSIS — Z794 Long term (current) use of insulin: Secondary | ICD-10-CM | POA: Diagnosis not present

## 2018-01-15 DIAGNOSIS — E109 Type 1 diabetes mellitus without complications: Secondary | ICD-10-CM | POA: Diagnosis not present

## 2018-01-16 DIAGNOSIS — M25861 Other specified joint disorders, right knee: Secondary | ICD-10-CM | POA: Diagnosis not present

## 2018-01-19 ENCOUNTER — Other Ambulatory Visit (INDEPENDENT_AMBULATORY_CARE_PROVIDER_SITE_OTHER): Payer: BLUE CROSS/BLUE SHIELD

## 2018-01-19 ENCOUNTER — Institutional Professional Consult (permissible substitution): Payer: BLUE CROSS/BLUE SHIELD | Admitting: Internal Medicine

## 2018-01-19 ENCOUNTER — Encounter: Payer: Self-pay | Admitting: Internal Medicine

## 2018-01-19 ENCOUNTER — Ambulatory Visit (INDEPENDENT_AMBULATORY_CARE_PROVIDER_SITE_OTHER): Payer: BLUE CROSS/BLUE SHIELD | Admitting: Internal Medicine

## 2018-01-19 VITALS — BP 122/80 | HR 98 | Ht 67.0 in | Wt 186.0 lb

## 2018-01-19 DIAGNOSIS — R058 Other specified cough: Secondary | ICD-10-CM

## 2018-01-19 DIAGNOSIS — R918 Other nonspecific abnormal finding of lung field: Secondary | ICD-10-CM | POA: Diagnosis not present

## 2018-01-19 DIAGNOSIS — R0600 Dyspnea, unspecified: Secondary | ICD-10-CM

## 2018-01-19 DIAGNOSIS — R05 Cough: Secondary | ICD-10-CM

## 2018-01-19 DIAGNOSIS — R0609 Other forms of dyspnea: Secondary | ICD-10-CM | POA: Diagnosis not present

## 2018-01-19 DIAGNOSIS — R059 Cough, unspecified: Secondary | ICD-10-CM | POA: Insufficient documentation

## 2018-01-19 LAB — CBC WITH DIFFERENTIAL/PLATELET
Basophils Absolute: 0 10*3/uL (ref 0.0–0.1)
Basophils Relative: 0.1 % (ref 0.0–3.0)
Eosinophils Absolute: 0 10*3/uL (ref 0.0–0.7)
Eosinophils Relative: 0 % (ref 0.0–5.0)
HCT: 34.6 % — ABNORMAL LOW (ref 36.0–46.0)
Hemoglobin: 11.2 g/dL — ABNORMAL LOW (ref 12.0–15.0)
Lymphocytes Relative: 7.5 % — ABNORMAL LOW (ref 12.0–46.0)
Lymphs Abs: 1 10*3/uL (ref 0.7–4.0)
MCHC: 32.3 g/dL (ref 30.0–36.0)
MCV: 87.9 fl (ref 78.0–100.0)
Monocytes Absolute: 0.5 10*3/uL (ref 0.1–1.0)
Monocytes Relative: 3.7 % (ref 3.0–12.0)
Neutro Abs: 11.9 10*3/uL — ABNORMAL HIGH (ref 1.4–7.7)
Neutrophils Relative %: 88.7 % — ABNORMAL HIGH (ref 43.0–77.0)
Platelets: 296 10*3/uL (ref 150.0–400.0)
RBC: 3.94 Mil/uL (ref 3.87–5.11)
RDW: 15.2 % (ref 11.5–15.5)
WBC: 13.4 10*3/uL — ABNORMAL HIGH (ref 4.0–10.5)

## 2018-01-19 MED ORDER — FAMOTIDINE 20 MG PO TABS: ORAL_TABLET | ORAL | 2 refills | Status: DC

## 2018-01-19 MED ORDER — PANTOPRAZOLE SODIUM 40 MG PO TBEC
40.0000 mg | DELAYED_RELEASE_TABLET | Freq: Every day | ORAL | 2 refills | Status: DC
Start: 2018-01-19 — End: 2018-05-29

## 2018-01-19 NOTE — Assessment & Plan Note (Signed)
Likely multifctorial but not based on primary lung dz ? Chf/ ? Anxiety/ ? Lupus assoc ILD > needs to return for pfts and walking sats same day   Discussed in detail all the  indications, usual  risks and alternatives  relative to the benefits with patient who agrees to proceed with w/u as outlined.

## 2018-01-19 NOTE — Patient Instructions (Signed)
Stop Breo  Only use your albuterol as a rescue medication to be used if you can't catch your breath by resting or doing a relaxed purse lip breathing pattern.  - The less you use it, the better it will work when you need it. - Ok to use up to every 4 hours if you must but call for immediate appointment if use goes up over your usual need - Don't leave home without it !!  (think of it like the spare tire for your car)   Pantoprazole (protonix) 40 mg   Take  30-60 min before first meal of the day and Pepcid (famotidine)  20 mg one @  bedtime until return to office - this is the best way to tell whether stomach acid is contributing to your problem.    For drainage / throat tickle try take CHLORPHENIRAMINE  4 mg - take one every 4 hours as needed - available over the counter- may cause drowsiness so start with just a bedtime dose or two and see how you tolerate it before trying in daytime.   Please remember to go to the lab department downstairs in the basement  for your tests - we will call you with the results when they are available.      Please schedule a follow up office visit in 4 weeks, sooner if needed with PFTs

## 2018-01-19 NOTE — Assessment & Plan Note (Signed)
On and off acei since 01/2014 and on entresto since 06/2017 per mar review - Allergy profile 01/19/2018 >  Eos 0. /  IgE   Symptoms are markedly disproportionate to objective findings and not clear this is actually a lung problem but pt does appear to have difficult to sort out respiratory symptoms of unknown origin for which  DDX  = almost all start with A and  include Adherence, Ace Inhibitors, Acid Reflux, Active Sinus Disease, Alpha 1 Antitripsin deficiency, Anxiety masquerading as Airways dz,  ABPA,  Allergy(esp in young), Aspiration (esp in elderly), Adverse effects of meds,  Active smokers, A bunch of PE's (a small clot burden can't cause this syndrome unless there is already severe underlying pulm or vascular dz with poor reserve) plus two Bs  = Bronchiectasis and Beta blocker use..and one C= CHF    Adherence is always the initial "prime suspect" and is a multilayered concern that requires a "trust but verify" approach in every patient - starting with knowing how to use medications, especially inhalers, correctly, keeping up with refills and understanding the fundamental difference between maintenance and prns vs those medications only taken for a very short course and then stopped and not refilled.  - return with all meds in hand using a trust but verify approach to confirm accurate Medication  Reconciliation The principal here is that until we are certain that the  patients are doing what we've asked, it makes no sense to ask them to do more.    ? Acid (or non-acid) GERD > always difficult to exclude as up to 75% of pts in some series report no assoc GI/ Heartburn symptoms> rec max (24h)  acid suppression and diet restrictions/ reviewed and instructions given in writing.    ? Adverse drug effects, esp ACEi and entresto and BREO   ? Allergy > very hard to believe ongoing upper airway complaints are allergic on up to 60 mg per day prednisone with no improvement and on singulair/Breo as well > try  off BREO  ? Anxiety > usually at the bottom of this list of usual suspects but should be much higher on this pt's based on H and P and note already on psychotropics .    ? BB effects > doubt on low doses of coreg but need to keep in mind if airflow obst documented on f/u pfts   ? Chf/ cardiac asthma > f/u chf clinic planned

## 2018-01-19 NOTE — Assessment & Plan Note (Addendum)
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 and she is at risk of this plus bacterial pna related to immunosuppression for lupus so no way to clear this up with no evidence of either dx at this point  For now nothing further needed pending f/u pfts but rec return asap for any symptoms similar to those experienced during "pna" episondes    Total time devoted to counseling  > 50 % of initial 60 min office visit:  review case with pt/ discussion of options/alternatives/ personally creating written customized instructions  in presence of pt  then going over those specific  Instructions directly with the pt including how to use all of the meds but in particular covering each new medication in detail and the difference between the maintenance= "automatic" meds and the prns using an action plan format for the latter (If this problem/symptom => do that organization reading Left to right).  Please see AVS from this visit for a full list of these instructions which I personally wrote for this pt and  are unique to this visit.

## 2018-01-19 NOTE — Progress Notes (Signed)
Subjective:     Patient ID: Christine Mcdowell, female   DOB: 07/17/1971,    MRN: [id number]  HPI  24 yobf never smoker with "as fare back as she can remember"  sneezing/ itching/ runny eyes/ nose  Complicated sinus infection but not need for inhalers able to do HS sports=volleyball / not seen by specialist but required admitted to [facility] "twice a year" starting around  Age 49-8 and this continued ever since except when living in  [place] late teens x 3 years > then flared again in [place] and eval by [employer] doctors not specialists rx prednisone worked the best  and then worse again on arrival back to [place] =  Age 48 and then dx lupus 2012 / feb 2015 dx was chf and on prednisone daily since 2012 per Dr Yves Dill at [facility] rheum and referred to pulmonary clinic 01/19/2018 by Dr Gae Bon PA for recurrent "pna"   01/19/2018 1st [facility] office visit/ [facility]   Chief Complaint  Patient presents with  . Pulmonary Consult    Referred by [name], NP. Pt states she has had PNA multiple times. She states she feels SOB "all the time" with or without exertion. She states she has year round allergies.  She has occ non prod cough and clears her throat often.   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 but condition did not improve Dry cough worsened Sept 2018 assoc with nasal and chest congestion  Legs more swollen than usual now  Unable to lie lower than 45 x feb 2015  Doe = MMRC3 = can't walk 100 yards even at a slow pace at a flat grade s stopping due to sob   Pain center of chest = xiphoid area s radiation with deep breathing x sev years  On and off acei since 01/2014 and on entresto since 06/2017 per mar review    No obvious day to day or daytime variability or assoc excess/ purulent sputum or mucus plugs or hemoptysis or   chest tightness, subjective wheeze or overt sinus or hb symptoms. No unusual  exposure hx or h/o childhood pna/ asthma or knowledge of premature birth.  Sleeping poorly at 45 degrees but  without nocturnal  or early am exacerbation  of respiratory  c/o's or need for noct saba. Also denies any obvious fluctuation of symptoms with weather or environmental changes or other aggravating or alleviating factors except as outlined above   Current Allergies, Complete Past Medical History, Past Surgical History, Family History, and Social History were reviewed in [employer] record.  ROS  The following are not active complaints unless bolded Hoarseness, sore throat, dysphagia, dental problems, itching, sneezing,  nasal congestion or discharge of excess mucus or purulent secretions, ear ache,   fever, chills, sweats, unintended wt loss or wt gain, classically pleuritic or exertional cp,  orthopnea pnd or leg swelling, presyncope, palpitations, abdominal pain, anorexia, nausea, vomiting, diarrhea  or change in bowel habits or change in bladder habits, change in stools or change in urine, dysuria, hematuria,  rash, arthralgias, visual complaints, headache, numbness, weakness or ataxia or problems with walking or coordination,  change in mood/affect or memory.        Current Meds  Medication Sig  . acetaminophen (TYLENOL) 500 MG tablet Take 1,500 mg by mouth 2 (two) times daily as needed for mild pain or fever.   Marland Kitchen  albuterol (ACCUNEB) 1.25 MG/3ML nebulizer solution Take 3 mLs (1.25 mg total) by nebulization every 6 (six) hours as needed for wheezing. (Patient taking differently: Take 1 ampule by nebulization 2 (two) times daily. )  . albuterol (PROVENTIL HFA;VENTOLIN HFA) 108 (90 Base) MCG/ACT inhaler Inhale 2 puffs into the lungs daily as needed for shortness of breath.  Marland Kitchen aspirin 81 MG chewable tablet Chew 162 mg by mouth every morning.   . carvedilol (COREG) 6.25 MG tablet Take 1 tablet (6.25 mg total) 2 (two) times daily with a meal by mouth.  . clonazePAM  (KLONOPIN) 0.5 MG tablet Take 1 tablet (0.5 mg total) by mouth 3 (three) times daily as needed (Anxiety).  Marland Kitchen diphenhydrAMINE (BENADRYL) 25 MG tablet Take 75 mg by mouth 3 (three) times daily.   . folic acid (FOLVITE) 1 MG tablet TAKE 1 TABLET BY MOUTH EVERY MORNING  . hydrALAZINE (APRESOLINE) 50 MG tablet Take 1.5 tablets (75 mg total) by mouth 3 (three) times daily.  . hydroxychloroquine (PLAQUENIL) 200 MG tablet Take 200 mg by mouth 2 (two) times daily.  . insulin aspart (NOVOLOG) 100 UNIT/ML injection Inject into the skin as directed.  . meloxicam (MOBIC) 15 MG tablet Take 15 mg by mouth daily.   . methotrexate (RHEUMATREX) 2.5 MG tablet Take 20 mg by mouth once a week. On Friday  . metolazone (ZAROXOLYN) 2.5 MG tablet Take 1 tablet (2.5 mg total) by mouth as directed. Take one tablet as needed for weight gain of 5 lbs within 3 days.  . metroNIDAZOLE (METROGEL) 0.75 % vaginal gel USE VAGINALLY EVERY NIGHT FOR 5 NIGHTS  . montelukast (SINGULAIR) 10 MG tablet Take 10 mg by mouth daily.   . Multiple Vitamin (MULTIVITAMIN WITH MINERALS) TABS Take 1 tablet by mouth every morning.   . ondansetron (ZOFRAN) 4 MG tablet Take 1 tablet (4 mg total) by mouth every 8 (eight) hours as needed for nausea or vomiting.  . potassium chloride SA (K-DUR,KLOR-CON) 20 MEQ tablet Take 20 mEq by mouth 2 (two) times daily.  . predniSONE (DELTASONE) 20 MG tablet Take 20 mg by mouth daily with breakfast.   . sacubitril-valsartan (ENTRESTO) 97-103 MG Take 1 tablet by mouth 2 (two) times daily.  Marland Kitchen spironolactone (ALDACTONE) 25 MG tablet Take 1 tablet (25 mg total) by mouth daily.  . Tofacitinib Citrate (XELJANZ) 5 MG TABS Take 5 mg by mouth 2 (two) times daily.  Marland Kitchen torsemide (DEMADEX) 20 MG tablet Take 2 tablets (40 mg total) by mouth 2 (two) times daily.  . traZODone (DESYREL) 50 MG tablet Take 1 tablet by mouth at bedtime.  . TRESIBA FLEXTOUCH 100 UNIT/ML SOPN FlexTouch Pen Inject 10 Units into the skin daily.  .  [DISCONTINUED] BREO ELLIPTA 100-25 MCG/INH AEPB Inhale 1 puff into the lungs daily.  . [DISCONTINUED] potassium chloride SA (K-DUR,KLOR-CON) 20 MEQ tablet Take 2 tablets (40 mEq total) by mouth 3 (three) times daily. (Patient taking differently: Take 40 mEq by mouth 2 (two) times daily. )             Review of Systems     Objective:   Physical Exam   Hoarse animated obese bf nad  Wt Readings from Last 3 Encounters:  01/19/18 186 lb (84.4 kg)  12/15/17 180 lb 4 oz (81.8 kg)  11/18/17 180 lb (81.6 kg)     Vital signs reviewed - Note on arrival 02 sats  98% on RA      HEENT: nl dentition, turbinates bilaterally, and  oropharynx. Nl external ear canals without cough reflex   NECK :  without JVD/Nodes/TM/ nl carotid upstrokes bilaterally   LUNGS: no acc muscle use,  Nl contour chest which is clear to A and P bilaterally without cough on insp or exp maneuvers   CV:  RRR  no s3 or murmur or increase in P2, and 1+ pitting edema slt worse on R   ABD:  soft and nontender with nl inspiratory excursion in the supine position. No bruits or organomegaly appreciated, bowel sounds nl  MS:  Nl gait/ ext warm without deformities, calf tenderness, cyanosis or clubbing R knee in brace   SKIN: warm and dry without lesions    NEURO:  alert, approp, nl sensorium with  no motor or cerebellar deficits apparent.    Labs ordered 01/19/2018    Allergy profile     I personally reviewed images and agree with radiology impression as follows:  CXR:   11/18/17 Mild cardiomegaly. Resolved left lower lobe pneumonia. No new consolidation.    Assessment:

## 2018-01-20 ENCOUNTER — Other Ambulatory Visit: Payer: Self-pay

## 2018-01-20 ENCOUNTER — Ambulatory Visit (INDEPENDENT_AMBULATORY_CARE_PROVIDER_SITE_OTHER): Payer: BLUE CROSS/BLUE SHIELD | Admitting: Internal Medicine

## 2018-01-20 ENCOUNTER — Encounter: Payer: Self-pay | Admitting: Internal Medicine

## 2018-01-20 VITALS — BP 124/70 | HR 92 | Ht 67.0 in | Wt 186.0 lb

## 2018-01-20 DIAGNOSIS — I420 Dilated cardiomyopathy: Secondary | ICD-10-CM

## 2018-01-20 DIAGNOSIS — I5022 Chronic systolic (congestive) heart failure: Secondary | ICD-10-CM | POA: Diagnosis not present

## 2018-01-20 LAB — RESPIRATORY ALLERGY PROFILE REGION II ~~LOC~~

## 2018-01-20 LAB — INTERPRETATION:

## 2018-01-20 NOTE — Patient Instructions (Addendum)
Medication Instructions:  Your physician recommends that you continue on your current medications as directed. Please refer to the Current Medication list given to you today.  Labwork: You will get lab work January 26, 2018 at [facility] office:  CBC, BMP and fibrinogen.  Testing/Procedures: Your physician has recommended that you have a defibrillator inserted. An implantable cardioverter defibrillator (ICD) is a small device that is placed in your chest or, in rare cases, your abdomen. This device uses electrical pulses or shocks to help control life-threatening, irregular heartbeats that could lead the heart to suddenly stop beating (sudden cardiac arrest). Leads are attached to the ICD that goes into your heart. This is done in the hospital and usually requires an overnight stay. Please see the instruction sheet given to you today for more information.  Follow-Up: You will follow up with device clinic 10-14 days after your procedure for a wound check. You will follow up with Dr. Lovena Mcdowell 91 days after your procedure.    Any Other Special Instructions Will Be Listed Below (If Applicable).  Please arrive at the [facility] main entrance of [place] [facility] at: 8:30 am on January 28, 2018.   Use the CHG surgical scrub the night before and morning of your procedure.  Follow the instruction sheet. Do not eat or drink after midnight the night prior to procedure. Take all your normal morning medications with a sip of water except for:  No injectable insulins (novolog, tresiba), no metolazone, no spironolactone, no torsemide. Plan for one night stay.  You will need someone to drive you home at discharge.   If you need a refill on your cardiac medications before your next appointment, please call your pharmacy.   Cardioverter Defibrillator Implantation An implantable cardioverter defibrillator (ICD) is a small device that is placed under the skin in the chest or abdomen. An ICD consists of a  battery, a small computer (pulse generator), and wires (leads) that go into the heart. An ICD is used to detect and correct two types of dangerous irregular heartbeats (arrhythmias):  A rapid heart rhythm (tachycardia).  An arrhythmia in which the lower chambers of the heart (ventricles) contract in an uncoordinated way (fibrillation).  When an ICD detects tachycardia, it sends a low-energy shock to the heart to restore the heartbeat to normal (cardioversion). This signal is usually painless. If cardioversion does not work or if the ICD detects fibrillation, it delivers a high-energy shock to the heart (defibrillation) to restart the heart. This shock may feel like a strong jolt in the chest. Your health care provider may prescribe an ICD if:  You have had an arrhythmia that originated in the ventricles.  Your heart has been damaged by a disease or heart condition.  Sometimes, ICDs are programmed to act as a device called a pacemaker. Pacemakers can be used to treat a slow heartbeat (bradycardia) or tachycardia by taking over the heart rate with electrical impulses. Tell a health care provider about:  Any allergies you have.  All medicines you are taking, including vitamins, herbs, eye drops, creams, and over-the-counter medicines.  Any problems you or family members have had with anesthetic medicines.  Any blood disorders you have.  Any surgeries you have had.  Any medical conditions you have.  Whether you are pregnant or may be pregnant. What are the risks? Generally, this is a safe procedure. However, problems may occur, including:  Swelling, bleeding, or bruising.  Infection.  Blood clots.  Damage to other structures or  organs, such as nerves, blood vessels, or the heart.  Allergic reactions to medicines used during the procedure.  What happens before the procedure? Staying hydrated Follow instructions from your health care provider about hydration, which may  include:  Up to 2 hours before the procedure - you may continue to drink clear liquids, such as water, clear fruit juice, black coffee, and plain tea.  Eating and drinking restrictions Follow instructions from your health care provider about eating and drinking, which may include:  8 hours before the procedure - stop eating heavy meals or foods such as meat, fried foods, or fatty foods.  6 hours before the procedure - stop eating light meals or foods, such as toast or cereal.  6 hours before the procedure - stop drinking milk or drinks that contain milk.  2 hours before the procedure - stop drinking clear liquids.  Medicine Ask your health care provider about:  Changing or stopping your normal medicines. This is important if you take diabetes medicines or blood thinners.  Taking medicines such as aspirin and ibuprofen. These medicines can thin your blood. Do not take these medicines before your procedure if your doctor tells you not to.  Tests  You may have blood tests.  You may have a test to check the electrical signals in your heart (electrocardiogram, ECG).  You may have imaging tests, such as a chest X-ray. General instructions  For 24 hours before the procedure, stop using products that contain nicotine or tobacco, such as cigarettes and e-cigarettes. If you need help quitting, ask your health care provider.  Plan to have someone take you home from the hospital or clinic.  You may be asked to shower with a germ-killing soap. What happens during the procedure?  To reduce your risk of infection: ? Your health care team will wash or sanitize their hands. ? Your skin will be washed with soap. ? Hair may be removed from the surgical area.  Small monitors will be put on your body. They will be used to check your heart, blood pressure, and oxygen level.  An IV tube will be inserted into one of your veins.  You will be given one or more of the following: ? A medicine to  help you relax (sedative). ? A medicine to numb the area (local anesthetic). ? A medicine to make you fall asleep (general anesthetic).  Leads will be guided through a blood vessel into your heart and attached to your heart muscles. Depending on the ICD, the leads may go into one ventricle or they may go into both ventricles and into an upper chamber of the heart. An X-ray machine (fluoroscope) will be usedto help guide the leads.  A small incision will be made to create a deep pocket under your skin.  The pulse generator will be placed into the pocket.  The ICD will be tested.  The incision will be closed with stitches (sutures), skin glue, or staples.  A bandage (dressing) will be placed over the incision. This procedure may vary among health care providers and hospitals. What happens after the procedure?  Your blood pressure, heart rate, breathing rate, and blood oxygen level will be monitored often until the medicines you were given have worn off.  A chest X-ray will be taken to check that the ICD is in the right place.  You will need to stay in the hospital for 1-2 days so your health care provider can make sure your ICD is working.  Do not drive for 24 hours if you received a sedative. Ask your health care provider when it is safe for you to drive.  You may be given an identification card explaining that you have an ICD. Summary  An implantable cardioverter defibrillator (ICD) is a small device that is placed under the skin in the chest or abdomen. It is used to detect and correct dangerous irregular heartbeats (arrhythmias).  An ICD consists of a battery, a small computer (pulse generator), and wires (leads) that go into the heart.  When an ICD detects rapid heart rhythm (tachycardia), it sends a low-energy shock to the heart to restore the heartbeat to normal (cardioversion). If cardioversion does not work or if the ICD detects uncoordinated heart contractions (fibrillation),  it delivers a high-energy shock to the heart (defibrillation) to restart the heart.  You will need to stay in the hospital for 1-2 days to make sure your ICD is working. This information is not intended to replace advice given to you by your health care provider. Make sure you discuss any questions you have with your health care provider. Document Released: 08/31/2002 Document Revised: 12/18/2016 Document Reviewed: 12/18/2016 Elsevier Interactive Patient Education  2017 [employer].

## 2018-01-20 NOTE — Progress Notes (Signed)
LMTCB

## 2018-01-20 NOTE — H&P (View-Only) (Signed)
HPI Christine Mcdowell is referred today by Dr. Reine Just for evaluation and consideration for ICD insertion. She is a pleasant 47 yo woman with a several year h/o LV dysfunction and Non-ischemic CM, who initially had improvement in her LV function but then has developed worsening symptoms and LV dysfunction. She has not had syncope. She does eat too much salt. She has had some peripheral edema, left more than right. She has normal coronary arteries.  Allergies  Allergen Reactions  . Hydromorphone Nausea And Vomiting    Other reaction(s): GI Upset (intolerance), Hypertension (intolerance) Raises blood pressure   . Iodinated Diagnostic Agents Other (See Comments)    Shuts down kidneys  . Other Other (See Comments) and Anaphylaxis    Spicy foods and seasonings Skin Prep "makes my skin peel off" Paper tape causes skin burns  . Erythromycin Nausea And Vomiting  . Latex Hives  . Mircette [Desogestrel-Ethinyl Estradiol] Nausea And Vomiting and Rash     Current Outpatient Medications  Medication Sig Dispense Refill  . acetaminophen (TYLENOL) 500 MG tablet Take 1,500 mg by mouth 2 (two) times daily as needed for mild pain or fever.     Marland Kitchen albuterol (ACCUNEB) 1.25 MG/3ML nebulizer solution Take 3 mLs (1.25 mg total) by nebulization every 6 (six) hours as needed for wheezing. (Patient taking differently: Take 1 ampule by nebulization 2 (two) times daily. ) 75 mL 12  . albuterol (PROVENTIL HFA;VENTOLIN HFA) 108 (90 Base) MCG/ACT inhaler Inhale 2 puffs into the lungs daily as needed for shortness of breath.    Marland Kitchen aspirin 81 MG chewable tablet Chew 162 mg by mouth every morning.     . carvedilol (COREG) 6.25 MG tablet Take 1 tablet (6.25 mg total) 2 (two) times daily with a meal by mouth. 60 tablet 3  . clonazePAM (KLONOPIN) 0.5 MG tablet Take 1 tablet (0.5 mg total) by mouth 3 (three) times daily as needed (Anxiety). 45 tablet 0  . diphenhydrAMINE (BENADRYL) 25 MG tablet Take 75 mg by mouth 3 (three)  times daily.     . famotidine (PEPCID) 20 MG tablet One at bedtime 30 tablet 2  . folic acid (FOLVITE) 1 MG tablet TAKE 1 TABLET BY MOUTH EVERY MORNING 30 tablet 0  . hydrALAZINE (APRESOLINE) 50 MG tablet Take 1.5 tablets (75 mg total) by mouth 3 (three) times daily. 135 tablet 6  . hydroxychloroquine (PLAQUENIL) 200 MG tablet Take 200 mg by mouth 2 (two) times daily.    . insulin aspart (NOVOLOG) 100 UNIT/ML injection Inject into the skin as directed.    . meloxicam (MOBIC) 15 MG tablet Take 15 mg by mouth daily.     . methotrexate (RHEUMATREX) 2.5 MG tablet Take 20 mg by mouth once a week. On Friday  3  . metroNIDAZOLE (METROGEL) 0.75 % vaginal gel USE VAGINALLY EVERY NIGHT FOR 5 NIGHTS 70 g 0  . montelukast (SINGULAIR) 10 MG tablet Take 10 mg by mouth daily.   3  . Multiple Vitamin (MULTIVITAMIN WITH MINERALS) TABS Take 1 tablet by mouth every morning.     . ondansetron (ZOFRAN) 4 MG tablet Take 1 tablet (4 mg total) by mouth every 8 (eight) hours as needed for nausea or vomiting. 20 tablet 0  . pantoprazole (PROTONIX) 40 MG tablet Take 1 tablet (40 mg total) by mouth daily. Take 30-60 min before first meal of the day 30 tablet 2  . potassium chloride SA (K-DUR,KLOR-CON) 20 MEQ tablet Take 20 mEq by  mouth 2 (two) times daily.    . predniSONE (DELTASONE) 20 MG tablet Take 20 mg by mouth daily with breakfast.     . sacubitril-valsartan (ENTRESTO) 97-103 MG Take 1 tablet by mouth 2 (two) times daily. 60 tablet 3  . spironolactone (ALDACTONE) 25 MG tablet Take 1 tablet (25 mg total) by mouth daily. 30 tablet 6  . Tofacitinib Citrate (XELJANZ) 5 MG TABS Take 5 mg by mouth 2 (two) times daily.    Marland Kitchen torsemide (DEMADEX) 20 MG tablet Take 2 tablets (40 mg total) by mouth 2 (two) times daily. 120 tablet 6  . traZODone (DESYREL) 50 MG tablet Take 1 tablet by mouth at bedtime.  5  . TRESIBA FLEXTOUCH 100 UNIT/ML SOPN FlexTouch Pen Inject 10 Units into the skin daily.  0  . metolazone (ZAROXOLYN) 2.5 MG  tablet Take 1 tablet (2.5 mg total) by mouth as directed. Take one tablet as needed for weight gain of 5 lbs within 3 days. 5 tablet 1   No current facility-administered medications for this visit.      Past Medical History:  Diagnosis Date  . Anemia   . Anginal pain ([place])   . Asthma   . Cervical cancer ([place])   . CHF (congestive heart failure) ([place])   . 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 Discoid lupus   . Fibromyalgia   . History of blood transfusion "several"   "related to anemia; had some w/hysterectomy also"  . Hx of cardiovascular stress test    ETT-Myoview (9/15):  No ischemia, EF 52%; NORMAL  . Hx of echocardiogram    Echo (9/15):  EF 50-55%, ant HK, Gr 1 DD, mild MR, mild LAE, no effusion  . Hypertension   . Iron deficiency anemia    h/o iron transfusions  . Lupus (systemic lupus erythematosus) ([place])   . Migraine    "a few/year" (07/03/2016)  . Pneumonia 12/2015  . RA (rheumatoid arthritis) ([place])    "all over" (07/03/2016)  . Sickle cell trait ([place])   . Stroke ([place]) 2014 X 1; 2015 X 2; 2016 X 1;    "right side of face more relaxed than the other; rare speech hesitation" (07/03/2016)  . Vaginal Pap smear, abnormal    ASCUS; HPV    ROS:   All systems reviewed and negative except as noted in the HPI.   Past Surgical History:  Procedure Laterality Date  . ABDOMINAL HYSTERECTOMY  2009  . ABDOMINAL WOUND DEHISCENCE  2009  . CARDIAC CATHETERIZATION N/A 10/11/2016   Procedure: Right/Left Heart Cath and Coronary Angiography;  Surgeon: Jolaine Artist, MD;  Location: [facility];  Service: Cardiovascular;  Laterality: N/A;  . DILATION AND CURETTAGE OF UTERUS  1991  . HEMATOMA EVACUATION  2009   abdomen  . INCISE AND DRAIN ABCESS  2009 X 2   "abdomen after hysterectomy"  . KNEE ARTHROSCOPY Right 1997  . KNEE SURGERY Right   . TUBAL LIGATION  1996     Family History  Problem Relation Age of Onset  . Arthritis Mother   .  Heart murmur Mother   . Drug abuse Mother   . Allergies Mother   . Heart attack Father   . Cushing syndrome Father   . Depression Father   . Allergies Father   . Dementia Paternal Grandmother   . Cancer Paternal Grandfather   . Diabetes Maternal Grandmother   . Hypertension Maternal Grandmother   .  Asthma Maternal Grandmother   . Heart attack Maternal Grandfather      Social History   Socioeconomic History  . Marital status: Divorced    Spouse name: Not on file  . Number of children: Not on file  . Years of education: Not on file  . Highest education level: Not on file  Social Needs  . Financial resource strain: Not on file  . Food insecurity - worry: Not on file  . Food insecurity - inability: Not on file  . Transportation needs - medical: Not on file  . Transportation needs - non-medical: Not on file  Occupational History  . Not on file  Tobacco Use  . Smoking status: Never Smoker  . Smokeless tobacco: Never Used  Substance and Sexual Activity  . Alcohol use: No  . Drug use: No  . Sexual activity: Not Currently    Birth control/protection: Surgical    Comment: hyst  Other Topics Concern  . Not on file  Social History Narrative  . Not on file     BP 124/70   Pulse 92   Ht 5\' 7"  (1.702 m)   Wt 186 lb (84.4 kg)   BMI 29.13 kg/m   Physical Exam:  Well appearing 47 yo woman, NAD HEENT: Unremarkable Neck:  6b cm JVD, no thyromegally Lymphatics:  No adenopathy Back:  No CVA tenderness Lungs:  Clear with no wheezes HEART:  Regular rate rhythm, no murmurs, no rubs, no clicks Abd:  soft, positive bowel sounds, no organomegally, no rebound, no guarding Ext:  2 plus pulses, no edema, no cyanosis, no clubbing Skin:  No rashes no nodules Neuro:  CN II through XII intact, motor grossly intact  EKG - NSR with a narrow QRS   Assess/Plan: 1. Chronic systolic heart failure - she has class 2 symptoms despite medical therapy and I have asked her to continue her  current meds.  2. H/o hypofibrinogenemia - we will check her levels prior to ICD insertion and give her cryoprecipitate if the levels are low.  3. SLE - she is on prednisone.  Mikle Bosworth.D.

## 2018-01-20 NOTE — Progress Notes (Signed)
HPI Christine Mcdowell is referred today by Dr. Reine Just for evaluation and consideration for ICD insertion. She is a pleasant 47 yo woman with a several year h/o LV dysfunction and Non-ischemic CM, who initially had improvement in her LV function but then has developed worsening symptoms and LV dysfunction. She has not had syncope. She does eat too much salt. She has had some peripheral edema, left more than right. She has normal coronary arteries.  Allergies  Allergen Reactions  . Hydromorphone Nausea And Vomiting    Other reaction(s): GI Upset (intolerance), Hypertension (intolerance) Raises blood pressure   . Iodinated Diagnostic Agents Other (See Comments)    Shuts down kidneys  . Other Other (See Comments) and Anaphylaxis    Spicy foods and seasonings Skin Prep "makes my skin peel off" Paper tape causes skin burns  . Erythromycin Nausea And Vomiting  . Latex Hives  . Mircette [Desogestrel-Ethinyl Estradiol] Nausea And Vomiting and Rash     Current Outpatient Medications  Medication Sig Dispense Refill  . acetaminophen (TYLENOL) 500 MG tablet Take 1,500 mg by mouth 2 (two) times daily as needed for mild pain or fever.     Marland Kitchen albuterol (ACCUNEB) 1.25 MG/3ML nebulizer solution Take 3 mLs (1.25 mg total) by nebulization every 6 (six) hours as needed for wheezing. (Patient taking differently: Take 1 ampule by nebulization 2 (two) times daily. ) 75 mL 12  . albuterol (PROVENTIL HFA;VENTOLIN HFA) 108 (90 Base) MCG/ACT inhaler Inhale 2 puffs into the lungs daily as needed for shortness of breath.    Marland Kitchen aspirin 81 MG chewable tablet Chew 162 mg by mouth every morning.     . carvedilol (COREG) 6.25 MG tablet Take 1 tablet (6.25 mg total) 2 (two) times daily with a meal by mouth. 60 tablet 3  . clonazePAM (KLONOPIN) 0.5 MG tablet Take 1 tablet (0.5 mg total) by mouth 3 (three) times daily as needed (Anxiety). 45 tablet 0  . diphenhydrAMINE (BENADRYL) 25 MG tablet Take 75 mg by mouth 3 (three)  times daily.     . famotidine (PEPCID) 20 MG tablet One at bedtime 30 tablet 2  . folic acid (FOLVITE) 1 MG tablet TAKE 1 TABLET BY MOUTH EVERY MORNING 30 tablet 0  . hydrALAZINE (APRESOLINE) 50 MG tablet Take 1.5 tablets (75 mg total) by mouth 3 (three) times daily. 135 tablet 6  . hydroxychloroquine (PLAQUENIL) 200 MG tablet Take 200 mg by mouth 2 (two) times daily.    . insulin aspart (NOVOLOG) 100 UNIT/ML injection Inject into the skin as directed.    . meloxicam (MOBIC) 15 MG tablet Take 15 mg by mouth daily.     . methotrexate (RHEUMATREX) 2.5 MG tablet Take 20 mg by mouth once a week. On Friday  3  . metroNIDAZOLE (METROGEL) 0.75 % vaginal gel USE VAGINALLY EVERY NIGHT FOR 5 NIGHTS 70 g 0  . montelukast (SINGULAIR) 10 MG tablet Take 10 mg by mouth daily.   3  . Multiple Vitamin (MULTIVITAMIN WITH MINERALS) TABS Take 1 tablet by mouth every morning.     . ondansetron (ZOFRAN) 4 MG tablet Take 1 tablet (4 mg total) by mouth every 8 (eight) hours as needed for nausea or vomiting. 20 tablet 0  . pantoprazole (PROTONIX) 40 MG tablet Take 1 tablet (40 mg total) by mouth daily. Take 30-60 min before first meal of the day 30 tablet 2  . potassium chloride SA (K-DUR,KLOR-CON) 20 MEQ tablet Take 20 mEq by  mouth 2 (two) times daily.    . predniSONE (DELTASONE) 20 MG tablet Take 20 mg by mouth daily with breakfast.     . sacubitril-valsartan (ENTRESTO) 97-103 MG Take 1 tablet by mouth 2 (two) times daily. 60 tablet 3  . spironolactone (ALDACTONE) 25 MG tablet Take 1 tablet (25 mg total) by mouth daily. 30 tablet 6  . Tofacitinib Citrate (XELJANZ) 5 MG TABS Take 5 mg by mouth 2 (two) times daily.    Marland Kitchen torsemide (DEMADEX) 20 MG tablet Take 2 tablets (40 mg total) by mouth 2 (two) times daily. 120 tablet 6  . traZODone (DESYREL) 50 MG tablet Take 1 tablet by mouth at bedtime.  5  . TRESIBA FLEXTOUCH 100 UNIT/ML SOPN FlexTouch Pen Inject 10 Units into the skin daily.  0  . metolazone (ZAROXOLYN) 2.5 MG  tablet Take 1 tablet (2.5 mg total) by mouth as directed. Take one tablet as needed for weight gain of 5 lbs within 3 days. 5 tablet 1   No current facility-administered medications for this visit.      Past Medical History:  Diagnosis Date  . Anemia   . Anginal pain ([place])   . Asthma   . Cervical cancer ([place])   . CHF (congestive heart failure) ([place])   . 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 Discoid lupus   . Fibromyalgia   . History of blood transfusion "several"   "related to anemia; had some w/hysterectomy also"  . Hx of cardiovascular stress test    ETT-Myoview (9/15):  No ischemia, EF 52%; NORMAL  . Hx of echocardiogram    Echo (9/15):  EF 50-55%, ant HK, Gr 1 DD, mild MR, mild LAE, no effusion  . Hypertension   . Iron deficiency anemia    h/o iron transfusions  . Lupus (systemic lupus erythematosus) ([place])   . Migraine    "a few/year" (07/03/2016)  . Pneumonia 12/2015  . RA (rheumatoid arthritis) ([place])    "all over" (07/03/2016)  . Sickle cell trait ([place])   . Stroke ([place]) 2014 X 1; 2015 X 2; 2016 X 1;    "right side of face more relaxed than the other; rare speech hesitation" (07/03/2016)  . Vaginal Pap smear, abnormal    ASCUS; HPV    ROS:   All systems reviewed and negative except as noted in the HPI.   Past Surgical History:  Procedure Laterality Date  . ABDOMINAL HYSTERECTOMY  2009  . ABDOMINAL WOUND DEHISCENCE  2009  . CARDIAC CATHETERIZATION N/A 10/11/2016   Procedure: Right/Left Heart Cath and Coronary Angiography;  Surgeon: Jolaine Artist, MD;  Location: [facility];  Service: Cardiovascular;  Laterality: N/A;  . DILATION AND CURETTAGE OF UTERUS  1991  . HEMATOMA EVACUATION  2009   abdomen  . INCISE AND DRAIN ABCESS  2009 X 2   "abdomen after hysterectomy"  . KNEE ARTHROSCOPY Right 1997  . KNEE SURGERY Right   . TUBAL LIGATION  1996     Family History  Problem Relation Age of Onset  . Arthritis Mother   .  Heart murmur Mother   . Drug abuse Mother   . Allergies Mother   . Heart attack Father   . Cushing syndrome Father   . Depression Father   . Allergies Father   . Dementia Paternal Grandmother   . Cancer Paternal Grandfather   . Diabetes Maternal Grandmother   . Hypertension Maternal Grandmother   .  Asthma Maternal Grandmother   . Heart attack Maternal Grandfather      Social History   Socioeconomic History  . Marital status: Divorced    Spouse name: Not on file  . Number of children: Not on file  . Years of education: Not on file  . Highest education level: Not on file  Social Needs  . Financial resource strain: Not on file  . Food insecurity - worry: Not on file  . Food insecurity - inability: Not on file  . Transportation needs - medical: Not on file  . Transportation needs - non-medical: Not on file  Occupational History  . Not on file  Tobacco Use  . Smoking status: Never Smoker  . Smokeless tobacco: Never Used  Substance and Sexual Activity  . Alcohol use: No  . Drug use: No  . Sexual activity: Not Currently    Birth control/protection: Surgical    Comment: hyst  Other Topics Concern  . Not on file  Social History Narrative  . Not on file     BP 124/70   Pulse 92   Ht 5\' 7"  (1.702 m)   Wt 186 lb (84.4 kg)   BMI 29.13 kg/m   Physical Exam:  Well appearing 47 yo woman, NAD HEENT: Unremarkable Neck:  6b cm JVD, no thyromegally Lymphatics:  No adenopathy Back:  No CVA tenderness Lungs:  Clear with no wheezes HEART:  Regular rate rhythm, no murmurs, no rubs, no clicks Abd:  soft, positive bowel sounds, no organomegally, no rebound, no guarding Ext:  2 plus pulses, no edema, no cyanosis, no clubbing Skin:  No rashes no nodules Neuro:  CN II through XII intact, motor grossly intact  EKG - NSR with a narrow QRS   Assess/Plan: 1. Chronic systolic heart failure - she has class 2 symptoms despite medical therapy and I have asked her to continue her  current meds.  2. H/o hypofibrinogenemia - we will check her levels prior to ICD insertion and give her cryoprecipitate if the levels are low.  3. SLE - she is on prednisone.  Mikle Bosworth.D.

## 2018-01-21 ENCOUNTER — Telehealth: Payer: Self-pay | Admitting: Internal Medicine

## 2018-01-21 NOTE — Telephone Encounter (Signed)
New message     Call from [facility] Patient scheduled for 2/6 ICD  Calling to confirm patient can resume home therapy for right knee on 2/11 Please call

## 2018-01-21 NOTE — Addendum Note (Signed)
Addended by: Marlis Edelson C on: 01/21/2018 04:27 PM   Modules accepted: Orders

## 2018-01-21 NOTE — Telephone Encounter (Signed)
Attempted to return call as requested.  Was on hold for 5 minutes.  Per Dr. Jarold Motto ok to resume [facility] therapy one week post procedure.

## 2018-01-22 NOTE — Progress Notes (Signed)
Spoke with pt and notified of results per Dr. Wert. Pt verbalized understanding and denied any questions. 

## 2018-01-26 ENCOUNTER — Telehealth: Payer: Self-pay

## 2018-01-26 ENCOUNTER — Other Ambulatory Visit: Payer: BLUE CROSS/BLUE SHIELD | Admitting: *Deleted

## 2018-01-26 ENCOUNTER — Telehealth: Payer: Self-pay | Admitting: Internal Medicine

## 2018-01-26 DIAGNOSIS — I420 Dilated cardiomyopathy: Secondary | ICD-10-CM

## 2018-01-26 DIAGNOSIS — I5022 Chronic systolic (congestive) heart failure: Secondary | ICD-10-CM | POA: Diagnosis not present

## 2018-01-26 LAB — BASIC METABOLIC PANEL
BUN/Creatinine Ratio: 15 (ref 9–23)
BUN: 13 mg/dL (ref 6–24)
CO2: 25 mmol/L (ref 20–29)
Calcium: 9.1 mg/dL (ref 8.7–10.2)
Chloride: 104 mmol/L (ref 96–106)
Creatinine, Ser: 0.88 mg/dL (ref 0.57–1.00)
GFR calc Af Amer: 89 mL/min/{1.73_m2} (ref >59)
GFR calc non Af Amer: 77 mL/min/{1.73_m2} (ref >59)
Glucose: 164 mg/dL — ABNORMAL HIGH (ref 65–99)
Potassium: 3.7 mmol/L (ref 3.5–5.2)
Sodium: 137 mmol/L (ref 134–144)

## 2018-01-26 LAB — CBC WITH DIFFERENTIAL/PLATELET
Basophils Absolute: 0 10*3/uL (ref 0.0–0.2)
Basos: 0 %
EOS (ABSOLUTE): 0 10*3/uL (ref 0.0–0.4)
Eos: 0 %
Hematocrit: 32.5 % — ABNORMAL LOW (ref 34.0–46.6)
Hemoglobin: 10.8 g/dL — ABNORMAL LOW (ref 11.1–15.9)
Lymphocytes Absolute: 1.3 10*3/uL (ref 0.7–3.1)
Lymphs: 12 %
MCH: 27.5 pg (ref 26.6–33.0)
MCHC: 33.2 g/dL (ref 31.5–35.7)
MCV: 83 fL (ref 79–97)
Monocytes Absolute: 0 10*3/uL — ABNORMAL LOW (ref 0.1–0.9)
Monocytes: 0 %
Neutrophils Absolute: 9.3 10*3/uL — ABNORMAL HIGH (ref 1.4–7.0)
Neutrophils: 85 %
Platelets: 277 10*3/uL (ref 150–379)
RBC: 3.93 x10E6/uL (ref 3.77–5.28)
RDW: 15.2 % (ref 12.3–15.4)
WBC: 10.6 10*3/uL (ref 3.4–10.8)

## 2018-01-26 LAB — IMMATURE CELLS: Bands(Auto) Relative: 3 %

## 2018-01-26 NOTE — Telephone Encounter (Signed)
Follow Up:   Christine Mcdowell calling to follow up on her phone call from 01-21-18 to find out about pt stating back on her therapy.

## 2018-01-26 NOTE — Telephone Encounter (Signed)
Left message with receptionist.  Notified Pt may resume therapy one week after her procedure.  Fax # for [facility] given if needed.

## 2018-01-26 NOTE — Telephone Encounter (Signed)
error 

## 2018-01-27 ENCOUNTER — Telehealth: Payer: Self-pay | Admitting: Internal Medicine

## 2018-01-27 ENCOUNTER — Encounter: Payer: Self-pay | Admitting: Internal Medicine

## 2018-01-27 LAB — FIBRINOGEN: Fibrinogen: 169 mg/dL — ABNORMAL LOW (ref 193–507)

## 2018-01-27 NOTE — Telephone Encounter (Signed)
New Message   Patient is calling to advise that tends to develop infections after surgery. She is highly recommending an antibiotic before and after surgery. She had infections for the last few surgeries that she has had. Please call to discuss.

## 2018-01-27 NOTE — Telephone Encounter (Signed)
Returned Pt call.  Notified of lab results, and notified she will receive pre and post antibiotics.  Pt indicates understanding, thanked for call.

## 2018-01-28 ENCOUNTER — Other Ambulatory Visit: Payer: Self-pay

## 2018-01-28 ENCOUNTER — Encounter (HOSPITAL_COMMUNITY)
Admission: RE | Disposition: A | Discharge status: Home / Self Care | Payer: Self-pay | Source: Ambulatory Visit | Attending: Internal Medicine

## 2018-01-28 ENCOUNTER — Ambulatory Visit (HOSPITAL_COMMUNITY)
Admission: RE | Admit: 2018-01-28 | Discharge: 2018-01-29 | Disposition: A | Discharge status: Home / Self Care | Payer: BLUE CROSS/BLUE SHIELD | Source: Ambulatory Visit | Attending: Internal Medicine | Admitting: Internal Medicine

## 2018-01-28 ENCOUNTER — Encounter (HOSPITAL_COMMUNITY): Payer: Self-pay | Admitting: General Practice

## 2018-01-28 DIAGNOSIS — Z7982 Long term (current) use of aspirin: Secondary | ICD-10-CM | POA: Diagnosis not present

## 2018-01-28 DIAGNOSIS — T380X5A Adverse effect of glucocorticoids and synthetic analogues, initial encounter: Secondary | ICD-10-CM | POA: Diagnosis not present

## 2018-01-28 DIAGNOSIS — M797 Fibromyalgia: Secondary | ICD-10-CM | POA: Insufficient documentation

## 2018-01-28 DIAGNOSIS — Z8541 Personal history of malignant neoplasm of cervix uteri: Secondary | ICD-10-CM | POA: Diagnosis not present

## 2018-01-28 DIAGNOSIS — L930 Discoid lupus erythematosus: Secondary | ICD-10-CM | POA: Diagnosis not present

## 2018-01-28 DIAGNOSIS — I110 Hypertensive heart disease with heart failure: Secondary | ICD-10-CM | POA: Insufficient documentation

## 2018-01-28 DIAGNOSIS — I5022 Chronic systolic (congestive) heart failure: Secondary | ICD-10-CM | POA: Diagnosis present

## 2018-01-28 DIAGNOSIS — Z8673 Personal history of transient ischemic attack (TIA), and cerebral infarction without residual deficits: Secondary | ICD-10-CM | POA: Insufficient documentation

## 2018-01-28 DIAGNOSIS — E099 Drug or chemical induced diabetes mellitus without complications: Secondary | ICD-10-CM | POA: Insufficient documentation

## 2018-01-28 DIAGNOSIS — Z794 Long term (current) use of insulin: Secondary | ICD-10-CM | POA: Insufficient documentation

## 2018-01-28 DIAGNOSIS — J45909 Unspecified asthma, uncomplicated: Secondary | ICD-10-CM | POA: Insufficient documentation

## 2018-01-28 DIAGNOSIS — I509 Heart failure, unspecified: Secondary | ICD-10-CM | POA: Insufficient documentation

## 2018-01-28 DIAGNOSIS — D688 Other specified coagulation defects: Secondary | ICD-10-CM | POA: Insufficient documentation

## 2018-01-28 DIAGNOSIS — G4733 Obstructive sleep apnea (adult) (pediatric): Secondary | ICD-10-CM | POA: Insufficient documentation

## 2018-01-28 DIAGNOSIS — Z79899 Other long term (current) drug therapy: Secondary | ICD-10-CM | POA: Diagnosis not present

## 2018-01-28 DIAGNOSIS — Z791 Long term (current) use of non-steroidal anti-inflammatories (NSAID): Secondary | ICD-10-CM | POA: Insufficient documentation

## 2018-01-28 DIAGNOSIS — M069 Rheumatoid arthritis, unspecified: Secondary | ICD-10-CM | POA: Insufficient documentation

## 2018-01-28 DIAGNOSIS — D573 Sickle-cell trait: Secondary | ICD-10-CM | POA: Diagnosis not present

## 2018-01-28 DIAGNOSIS — Z95810 Presence of automatic (implantable) cardiac defibrillator: Secondary | ICD-10-CM

## 2018-01-28 DIAGNOSIS — I428 Other cardiomyopathies: Secondary | ICD-10-CM | POA: Diagnosis not present

## 2018-01-28 DIAGNOSIS — Z7952 Long term (current) use of systemic steroids: Secondary | ICD-10-CM | POA: Diagnosis not present

## 2018-01-28 DIAGNOSIS — Z006 Encounter for examination for normal comparison and control in clinical research program: Secondary | ICD-10-CM | POA: Insufficient documentation

## 2018-01-28 HISTORY — PX: ICD IMPLANT: EP1208

## 2018-01-28 HISTORY — DX: Presence of automatic (implantable) cardiac defibrillator: Z95.810

## 2018-01-28 LAB — TYPE AND SCREEN
ABO/RH(D): A POS
Antibody Screen: NEGATIVE

## 2018-01-28 LAB — GLUCOSE, CAPILLARY
Glucose-Capillary: 101 mg/dL — ABNORMAL HIGH (ref 65–99)
Glucose-Capillary: 229 mg/dL — ABNORMAL HIGH (ref 65–99)
Glucose-Capillary: 149 mg/dL — ABNORMAL HIGH (ref 65–99)

## 2018-01-28 LAB — SURGICAL PCR SCREEN
MRSA, PCR: NEGATIVE
Staphylococcus aureus: NEGATIVE

## 2018-01-28 LAB — ABO/RH: ABO/RH(D): A POS

## 2018-01-28 SURGERY — ICD IMPLANT

## 2018-01-28 MED ORDER — BUPIVACAINE HCL (PF) 0.25 % IJ SOLN
INTRAMUSCULAR | Status: DC | PRN
  Administered 2018-01-28: 90 mL

## 2018-01-28 MED ORDER — INSULIN ASPART 100 UNIT/ML ~~LOC~~ SOLN: 5.0000 [IU] | SUBCUTANEOUS | Status: DC

## 2018-01-28 MED ORDER — CEFAZOLIN SODIUM-DEXTROSE 2-4 GM/100ML-% IV SOLN
INTRAVENOUS | Status: AC
  Filled 2018-01-28: qty 100

## 2018-01-28 MED ORDER — METHOTREXATE 2.5 MG PO TABS: 20.0000 mg | ORAL_TABLET | ORAL | Status: DC

## 2018-01-28 MED ORDER — SODIUM CHLORIDE 0.9 % IR SOLN
80.0000 mg | Status: AC
  Administered 2018-01-28: 80 mg

## 2018-01-28 MED ORDER — SODIUM CHLORIDE 0.9 % IV SOLN: Freq: Once | INTRAVENOUS | Status: DC

## 2018-01-28 MED ORDER — ALBUTEROL SULFATE HFA 108 (90 BASE) MCG/ACT IN AERS
2.0000 | INHALATION_SPRAY | Freq: Every day | RESPIRATORY_TRACT | Status: DC | PRN
  Filled 2018-01-28: qty 6.7

## 2018-01-28 MED ORDER — ASPIRIN 81 MG PO CHEW
162.0000 mg | CHEWABLE_TABLET | Freq: Every morning | ORAL | Status: DC
  Administered 2018-01-29: 162 mg via ORAL
  Filled 2018-01-28: qty 2

## 2018-01-28 MED ORDER — HYDROCODONE-ACETAMINOPHEN 5-325 MG PO TABS
1.0000 | ORAL_TABLET | Freq: Four times a day (QID) | ORAL | Status: DC | PRN
  Administered 2018-01-28 – 2018-01-29 (×3): 2 via ORAL
  Filled 2018-01-28 (×3): qty 2

## 2018-01-28 MED ORDER — ONDANSETRON HCL 4 MG/2ML IJ SOLN: 4.0000 mg | Freq: Four times a day (QID) | INTRAMUSCULAR | Status: DC | PRN

## 2018-01-28 MED ORDER — TOFACITINIB CITRATE 5 MG PO TABS: 5.0000 mg | ORAL_TABLET | Freq: Two times a day (BID) | ORAL | Status: DC

## 2018-01-28 MED ORDER — BUPIVACAINE HCL (PF) 0.25 % IJ SOLN
INTRAMUSCULAR | Status: AC
  Filled 2018-01-28: qty 30

## 2018-01-28 MED ORDER — HEPARIN (PORCINE) IN NACL 2-0.9 UNIT/ML-% IJ SOLN
INTRAMUSCULAR | Status: AC
  Filled 2018-01-28: qty 500

## 2018-01-28 MED ORDER — PANTOPRAZOLE SODIUM 40 MG PO TBEC
40.0000 mg | DELAYED_RELEASE_TABLET | Freq: Every day | ORAL | Status: DC
  Administered 2018-01-29: 40 mg via ORAL
  Filled 2018-01-28: qty 1

## 2018-01-28 MED ORDER — ALBUTEROL SULFATE (2.5 MG/3ML) 0.083% IN NEBU: 1.2500 mg | INHALATION_SOLUTION | Freq: Four times a day (QID) | RESPIRATORY_TRACT | Status: DC | PRN

## 2018-01-28 MED ORDER — CARVEDILOL 6.25 MG PO TABS
6.2500 mg | ORAL_TABLET | Freq: Two times a day (BID) | ORAL | Status: DC
  Administered 2018-01-28 – 2018-01-29 (×2): 6.25 mg via ORAL
  Filled 2018-01-28 (×2): qty 1

## 2018-01-28 MED ORDER — MELOXICAM 7.5 MG PO TABS
15.0000 mg | ORAL_TABLET | Freq: Every day | ORAL | Status: DC
  Administered 2018-01-29: 15 mg via ORAL
  Filled 2018-01-28: qty 2

## 2018-01-28 MED ORDER — MIDAZOLAM HCL 5 MG/5ML IJ SOLN
INTRAMUSCULAR | Status: DC | PRN
  Administered 2018-01-28 (×2): 1 mg via INTRAVENOUS
  Administered 2018-01-28: 2 mg via INTRAVENOUS
  Administered 2018-01-28: 1 mg via INTRAVENOUS

## 2018-01-28 MED ORDER — ADULT MULTIVITAMIN W/MINERALS CH
1.0000 | ORAL_TABLET | Freq: Every morning | ORAL | Status: DC
  Administered 2018-01-29: 1 via ORAL
  Filled 2018-01-28: qty 1

## 2018-01-28 MED ORDER — SODIUM CHLORIDE 0.9 % IV SOLN
INTRAVENOUS | Status: DC
  Administered 2018-01-28 (×2): via INTRAVENOUS

## 2018-01-28 MED ORDER — LIDOCAINE HCL (PF) 1 % IJ SOLN: INTRAMUSCULAR | Status: DC | PRN

## 2018-01-28 MED ORDER — DIPHENHYDRAMINE HCL 25 MG PO CAPS
75.0000 mg | ORAL_CAPSULE | Freq: Three times a day (TID) | ORAL | Status: DC
  Administered 2018-01-28 – 2018-01-29 (×2): 75 mg via ORAL
  Filled 2018-01-28 (×2): qty 3

## 2018-01-28 MED ORDER — MONTELUKAST SODIUM 10 MG PO TABS
10.0000 mg | ORAL_TABLET | Freq: Every day | ORAL | Status: DC
  Administered 2018-01-29: 10 mg via ORAL
  Filled 2018-01-28: qty 1

## 2018-01-28 MED ORDER — HEPARIN (PORCINE) IN NACL 2-0.9 UNIT/ML-% IJ SOLN
INTRAMUSCULAR | Status: AC | PRN
  Administered 2018-01-28: 500 mL

## 2018-01-28 MED ORDER — SPIRONOLACTONE 25 MG PO TABS
25.0000 mg | ORAL_TABLET | Freq: Every day | ORAL | Status: DC
  Administered 2018-01-29: 25 mg via ORAL
  Filled 2018-01-28: qty 1

## 2018-01-28 MED ORDER — INSULIN GLARGINE 100 UNIT/ML ~~LOC~~ SOLN
10.0000 [IU] | Freq: Every evening | SUBCUTANEOUS | Status: DC
  Filled 2018-01-28: qty 0.1

## 2018-01-28 MED ORDER — MIDAZOLAM HCL 5 MG/5ML IJ SOLN
INTRAMUSCULAR | Status: AC
  Filled 2018-01-28: qty 5

## 2018-01-28 MED ORDER — SACUBITRIL-VALSARTAN 97-103 MG PO TABS
1.0000 | ORAL_TABLET | Freq: Two times a day (BID) | ORAL | Status: DC
  Administered 2018-01-28 – 2018-01-29 (×2): 1 via ORAL
  Filled 2018-01-28 (×2): qty 1

## 2018-01-28 MED ORDER — INSULIN DEGLUDEC 100 UNIT/ML ~~LOC~~ SOPN: 10.0000 [IU] | PEN_INJECTOR | Freq: Every day | SUBCUTANEOUS | Status: DC

## 2018-01-28 MED ORDER — TRAZODONE HCL 50 MG PO TABS: 50.0000 mg | ORAL_TABLET | Freq: Every day | ORAL | Status: DC

## 2018-01-28 MED ORDER — CLONAZEPAM 0.5 MG PO TABS: 0.5000 mg | ORAL_TABLET | Freq: Three times a day (TID) | ORAL | Status: DC | PRN

## 2018-01-28 MED ORDER — ONDANSETRON HCL 4 MG PO TABS: 4.0000 mg | ORAL_TABLET | Freq: Three times a day (TID) | ORAL | Status: DC | PRN

## 2018-01-28 MED ORDER — CEFAZOLIN SODIUM-DEXTROSE 2-4 GM/100ML-% IV SOLN
2.0000 g | INTRAVENOUS | Status: AC
  Administered 2018-01-28: 2 g via INTRAVENOUS

## 2018-01-28 MED ORDER — FOLIC ACID 1 MG PO TABS
1.0000 mg | ORAL_TABLET | Freq: Every morning | ORAL | Status: DC
  Administered 2018-01-29: 1 mg via ORAL
  Filled 2018-01-28: qty 1

## 2018-01-28 MED ORDER — FENTANYL CITRATE (PF) 100 MCG/2ML IJ SOLN
INTRAMUSCULAR | Status: DC | PRN
  Administered 2018-01-28 (×3): 25 ug via INTRAVENOUS

## 2018-01-28 MED ORDER — CEFAZOLIN SODIUM-DEXTROSE 1-4 GM/50ML-% IV SOLN
1.0000 g | Freq: Four times a day (QID) | INTRAVENOUS | Status: AC
  Administered 2018-01-28 – 2018-01-29 (×3): 1 g via INTRAVENOUS
  Filled 2018-01-28 (×3): qty 50

## 2018-01-28 MED ORDER — ACETAMINOPHEN 325 MG PO TABS
325.0000 mg | ORAL_TABLET | ORAL | Status: DC | PRN
  Filled 2018-01-28: qty 2

## 2018-01-28 MED ORDER — CHLORHEXIDINE GLUCONATE 4 % EX LIQD: 60.0000 mL | Freq: Once | CUTANEOUS | Status: DC

## 2018-01-28 MED ORDER — MUPIROCIN 2 % EX OINT
TOPICAL_OINTMENT | CUTANEOUS | Status: AC
  Administered 2018-01-28: 1 via TOPICAL
  Filled 2018-01-28: qty 22

## 2018-01-28 MED ORDER — SODIUM CHLORIDE 0.9 % IR SOLN
Status: AC
  Filled 2018-01-28: qty 2

## 2018-01-28 MED ORDER — TORSEMIDE 20 MG PO TABS
40.0000 mg | ORAL_TABLET | Freq: Two times a day (BID) | ORAL | Status: DC
  Administered 2018-01-28 – 2018-01-29 (×2): 40 mg via ORAL
  Filled 2018-01-28 (×2): qty 2

## 2018-01-28 MED ORDER — HYDRALAZINE HCL 50 MG PO TABS
75.0000 mg | ORAL_TABLET | Freq: Three times a day (TID) | ORAL | Status: DC
  Administered 2018-01-28 – 2018-01-29 (×2): 75 mg via ORAL
  Filled 2018-01-28 (×2): qty 1

## 2018-01-28 MED ORDER — PREDNISONE 20 MG PO TABS
20.0000 mg | ORAL_TABLET | Freq: Every day | ORAL | Status: DC
  Administered 2018-01-29: 20 mg via ORAL
  Filled 2018-01-28: qty 1

## 2018-01-28 MED ORDER — METOLAZONE 5 MG PO TABS
2.5000 mg | ORAL_TABLET | ORAL | Status: DC
  Administered 2018-01-29: 2.5 mg via ORAL
  Filled 2018-01-28: qty 1

## 2018-01-28 MED ORDER — MUPIROCIN 2 % EX OINT
1.0000 "application " | TOPICAL_OINTMENT | Freq: Once | CUTANEOUS | Status: AC
  Administered 2018-01-28: 1 via TOPICAL

## 2018-01-28 MED ORDER — POTASSIUM CHLORIDE CRYS ER 20 MEQ PO TBCR
20.0000 meq | EXTENDED_RELEASE_TABLET | Freq: Two times a day (BID) | ORAL | Status: DC
  Administered 2018-01-28 – 2018-01-29 (×2): 20 meq via ORAL
  Filled 2018-01-28 (×2): qty 1

## 2018-01-28 MED ORDER — FENTANYL CITRATE (PF) 100 MCG/2ML IJ SOLN
INTRAMUSCULAR | Status: AC
  Filled 2018-01-28: qty 2

## 2018-01-28 MED ORDER — HYDROXYCHLOROQUINE SULFATE 200 MG PO TABS
200.0000 mg | ORAL_TABLET | Freq: Two times a day (BID) | ORAL | Status: DC
  Administered 2018-01-28 – 2018-01-29 (×2): 200 mg via ORAL
  Filled 2018-01-28 (×2): qty 1

## 2018-01-28 SURGICAL SUPPLY — 6 items
CABLE SURGICAL S-101-97-12 (CABLE) ×2 IMPLANT
ICD VIGILANT VR D232 (Pacemaker) ×2 IMPLANT
LEAD RELIANCE G DF4 0292 (Lead) ×2 IMPLANT
PAD DEFIB LIFELINK (PAD) ×2 IMPLANT
SHEATH CLASSIC 9F (SHEATH) ×2 IMPLANT
TRAY PACEMAKER INSERTION (PACKS) ×2 IMPLANT

## 2018-01-28 NOTE — Progress Notes (Signed)
Patient rating left chest ICD insertion site pain 9/10, described as stabbing, per patient when coughs pain is sharp.  Patient had 2 Norco 5/325 tablets at 1830, ordered q6h, can't have another dose until 0030.  RN text paged Cardiology with this information.  On call for Cardiology returned page and stated ok to give Norco dose early.

## 2018-01-28 NOTE — Interval H&P Note (Signed)
History and Physical Interval Note:  01/28/2018 8:47 AM  Christine Mcdowell  has presented today for surgery, with the diagnosis of hf - cm  The various methods of treatment have been discussed with the patient and family. After consideration of risks, benefits and other options for treatment, the patient has consented to  Procedure(s): ICD IMPLANT (N/A) as a surgical intervention .  The patient's history has been reviewed, patient examined, no change in status, stable for surgery.  I have reviewed the patient's chart and labs.  Questions were answered to the patient's satisfaction.     Cristopher Peru

## 2018-01-28 NOTE — Discharge Instructions (Signed)
° ° °  Supplemental Discharge Instructions for  Pacemaker/Defibrillator Patients  Activity No heavy lifting or vigorous activity with your left/right arm for 6 to 8 weeks.  Do not raise your left/right arm above your head for one week.  Gradually raise your affected arm as drawn below.             02/01/18                     02/02/18                    02/03/18                   02/04/18 __  NO DRIVING for  1 week   ; you may begin driving on  0/10/27  .  WOUND CARE - Keep the wound area clean and dry.  Do not get this area wet for one week. No showers for one week; you may shower on  02/04/18  . - The tape/steri-strips on your wound will fall off; do not pull them off.  No bandage is needed on the site.  DO  NOT apply any creams, oils, or ointments to the wound area. - If you notice any drainage or discharge from the wound, any swelling or bruising at the site, or you develop a fever > 101? F after you are discharged home, call the office at once.  Special Instructions - You are still able to use cellular telephones; use the ear opposite the side where you have your pacemaker/defibrillator.  Avoid carrying your cellular phone near your device. - When traveling through airports, show security personnel your identification card to avoid being screened in the metal detectors.  Ask the security personnel to use the hand wand. - Avoid arc welding equipment, MRI testing (magnetic resonance imaging), TENS units (transcutaneous nerve stimulators).  Call the office for questions about other devices. - Avoid electrical appliances that are in poor condition or are not properly grounded. - Microwave ovens are safe to be near or to operate.  Additional information for defibrillator patients should your device go off: - If your device goes off ONCE and you feel fine afterward, notify the device clinic nurses. - If your device goes off ONCE and you do not feel well afterward, call 911. - If your device goes  off TWICE, call 911. - If your device goes off THREE times in one day, call 911.  DO NOT DRIVE YOURSELF OR A FAMILY MEMBER WITH A DEFIBRILLATOR TO THE HOSPITAL--CALL 911.

## 2018-01-28 NOTE — Discharge Summary (Signed)
ELECTROPHYSIOLOGY PROCEDURE DISCHARGE SUMMARY    Patient ID: Christine Mcdowell,  MRN: [id number], DOB/AGE: December 08, 1971 47 y.o.  Admit date: 01/28/2018 Discharge date: 01/29/18  Primary Care Physician: [name], MD Primary Cardiologist: Dr. Haroldine Laws  Electrophysiologist: Dr. 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 Lupus 2. HTN 3. DM 4. Chronic CHF 5. OSA  Allergies  Allergen Reactions  . Hydromorphone Nausea And Vomiting    Other reaction(s): GI Upset (intolerance), Hypertension (intolerance) Raises blood pressure   . Iodinated Diagnostic Agents Other (See Comments)    Shuts down kidneys  . Other Other (See Comments) and Anaphylaxis    Spicy foods and seasonings Skin Prep "makes my skin peel off" Paper tape causes skin burns  . Erythromycin Nausea And Vomiting  . Latex Hives  . Mircette [Desogestrel-Ethinyl Estradiol] Nausea And Vomiting and Rash     Procedures This Admission:  1.  Implantation of a BSci single chamber ICD on 01/28/18 by Dr Lovena Le.  The patient received a Development worker, community (serial # [id number] ) right ventricular defibrillator lead [employer] (serial Number [phone]) ICD DFT's were deferred at time of implant.  There were no immediate post procedure complications. 2.  CXR on  demonstrated no pneumothorax status post device implantation.    Brief HPI: Christine Mcdowell is a 47 y.o. female was referred to electrophysiology in the outpatient setting for consideration of ICD implantation.  Past medical history is noted above.  The patient has persistent LV dysfunction despite guideline directed therapy.  Risks, benefits, and alternatives to ICD implantation were reviewed with the patient who wished to proceed.    Hospital Course:  The patient was admitted pre-treated with cryoprecipitate and underwent implantation of an ICD with details as outlined above. She was monitored  on telemetry overnight which demonstrated SR.  Left chest was without hematoma or ecchymosis.  Dressing is dry, no evidence of bleeding, in d/w Dr. Lovena Le, no need to have f/u fibrinogen level.  The device was interrogated and found to be functioning normally.  CXR was obtained and demonstrated no pneumothorax status post device implantation.  Wound care, arm mobility, and restrictions were reviewed with the patient.  She requested pain management for site discomfort, reports she is able to take the Vicodin, was written for by Dr. Lovena Le and instructed not to use simultaneously with her Tylenol. The patient was examined by Dr. Lovena Le and considered stable for discharge to home.   The patient's discharge medications include an ARB (Entresto) and beta blocker (carvedilol).   Physical Exam: Vitals:   01/28/18 2118 01/28/18 2250 01/29/18 0423 01/29/18 0511  BP: 124/78 135/85 127/83   Pulse: 90 81 80   Resp: 16 18 13    Temp: 97.8 F (36.6 C)  (!) 97.4 F (36.3 C)   TempSrc: Oral  Oral   SpO2: 99% 100% 98%   Weight:    185 lb 4.8 oz (84.1 kg)  Height:        GEN- The patient is well appearing, alert and oriented x 3 today.   HEENT: normocephalic, atraumatic; sclera clear, conjunctiva pink; hearing intact; oropharynx clear Lungs-  CTA b/l, normal work of breathing.  No wheezes, rales, rhonchi Heart- RRR, no murmurs, rubs or gallops, PMI not laterally displaced GI- soft, non-tender, non-distended Extremities- no clubbing, cyanosis, or edema MS- no significant deformity or atrophy Skin- warm and dry, no rash or lesion, left chest without hematoma/ecchymosis, is dry Psych- euthymic  mood, full affect Neuro- no gross defecits  Labs:   Lab Results  Component Value Date   WBC 10.6 01/26/2018   HGB 10.8 (L) 01/26/2018   HCT 32.5 (L) 01/26/2018   MCV 83 01/26/2018   PLT 277 01/26/2018    Recent Labs  Lab 01/26/18 1538  NA 137  K 3.7  CL 104  CO2 25  BUN 13  CREATININE 0.88  CALCIUM  9.1  GLUCOSE 164*    Discharge Medications:  Allergies as of 01/29/2018      Reactions   Hydromorphone Nausea And Vomiting   Other reaction(s): GI Upset (intolerance), Hypertension (intolerance) Raises blood pressure    Iodinated Diagnostic Agents Other (See Comments)   Shuts down kidneys   Other Other (See Comments), Anaphylaxis   Spicy foods and seasonings Skin Prep "makes my skin peel off" Paper tape causes skin burns   Erythromycin Nausea And Vomiting   Latex Hives   Mircette [desogestrel-ethinyl Estradiol] Nausea And Vomiting, Rash      Medication List    TAKE these medications   acetaminophen 500 MG tablet Commonly known as:  TYLENOL Take 1,500 mg by mouth 2 (two) times daily as needed for mild pain or fever.   albuterol 108 (90 Base) MCG/ACT inhaler Commonly known as:  PROVENTIL HFA;VENTOLIN HFA Inhale 2 puffs into the lungs daily as needed for shortness of breath. What changed:  Another medication with the same name was changed. Make sure you understand how and when to take each.   albuterol 1.25 MG/3ML nebulizer solution Commonly known as:  ACCUNEB Take 3 mLs (1.25 mg total) by nebulization every 6 (six) hours as needed for wheezing. What changed:  when to take this   aspirin 81 MG chewable tablet Chew 162 mg by mouth every morning.   carvedilol 6.25 MG tablet Commonly known as:  COREG Take 1 tablet (6.25 mg total) 2 (two) times daily with a meal by mouth.   clonazePAM 0.5 MG tablet Commonly known as:  KLONOPIN Take 1 tablet (0.5 mg total) by mouth 3 (three) times daily as needed (Anxiety).   diphenhydrAMINE 25 MG tablet Commonly known as:  BENADRYL Take 75 mg by mouth 3 (three) times daily.   famotidine 20 MG tablet Commonly known as:  PEPCID One at bedtime What changed:    how much to take  how to take this  when to take this  additional instructions   folic acid 1 MG tablet Commonly known as:  FOLVITE TAKE 1 TABLET BY MOUTH EVERY MORNING    hydrALAZINE 50 MG tablet Commonly known as:  APRESOLINE Take 1.5 tablets (75 mg total) by mouth 3 (three) times daily.   HYDROcodone-acetaminophen 5-325 MG tablet Commonly known as:  NORCO/VICODIN Take 1 tablet by mouth every 6 (six) hours as needed for severe pain. Notes to patient:  Do not drive while taking, do not use/take Tylenol at the same time as using this medicine contains Tylenol (acetaminophen)   hydroxychloroquine 200 MG tablet Commonly known as:  PLAQUENIL Take 200 mg by mouth 2 (two) times daily.   insulin aspart 100 UNIT/ML injection Commonly known as:  novoLOG Inject 5-10 Units into the skin See admin instructions. Only if blood sugar is above 150; on sliding scale   meloxicam 15 MG tablet Commonly known as:  MOBIC Take 15 mg by mouth daily.   methotrexate 2.5 MG tablet Commonly known as:  RHEUMATREX Take 20 mg by mouth once a week. On Friday   metolazone  2.5 MG tablet Commonly known as:  ZAROXOLYN Take 1 tablet (2.5 mg total) by mouth as directed. Take one tablet as needed for weight gain of 5 lbs within 3 days. What changed:    when to take this  additional instructions   metroNIDAZOLE 0.75 % vaginal gel Commonly known as:  METROGEL USE VAGINALLY EVERY NIGHT FOR 5 NIGHTS What changed:    how much to take  how to take this  when to take this  reasons to take this  additional instructions   montelukast 10 MG tablet Commonly known as:  SINGULAIR Take 10 mg by mouth daily.   multivitamin with minerals Tabs tablet Take 1 tablet by mouth every morning.   ondansetron 4 MG tablet Commonly known as:  ZOFRAN Take 1 tablet (4 mg total) by mouth every 8 (eight) hours as needed for nausea or vomiting.   pantoprazole 40 MG tablet Commonly known as:  PROTONIX Take 1 tablet (40 mg total) by mouth daily. Take 30-60 min before first meal of the day   potassium chloride SA 20 MEQ tablet Commonly known as:  K-DUR,KLOR-CON Take 20 mEq by mouth 2 (two)  times daily.   predniSONE 20 MG tablet Commonly known as:  DELTASONE Take 20 mg by mouth daily with breakfast.   sacubitril-valsartan 97-103 MG Commonly known as:  ENTRESTO Take 1 tablet by mouth 2 (two) times daily.   spironolactone 25 MG tablet Commonly known as:  ALDACTONE Take 1 tablet (25 mg total) by mouth daily.   torsemide 20 MG tablet Commonly known as:  DEMADEX Take 2 tablets (40 mg total) by mouth 2 (two) times daily.   traZODone 50 MG tablet Commonly known as:  DESYREL Take 1 tablet by mouth at bedtime.   TRESIBA FLEXTOUCH 100 UNIT/ML Sopn FlexTouch Pen Generic drug:  insulin degludec Inject 10 Units into the skin every evening.   XELJANZ 5 MG Tabs Generic drug:  Tofacitinib Citrate Take 5 mg by mouth 2 (two) times daily.       Disposition: Home  Discharge Instructions    Diet - low sodium heart healthy   Complete by:  As directed    Increase activity slowly   Complete by:  As directed      Follow-up Information    [facility] Follow up on 02/09/2018.   Specialty:  Cardiology Why:  4:30PM, wound check visit Contact information: [address]       Bensimhon, Shaune Pascal, MD Follow up on 03/17/2018.   Specialty:  Cardiology Why:  3:00PM Contact information: [place] [address] [phone]        Evans Lance, MD Follow up on 05/13/2018.   Specialty:  Cardiology Why:  3:15PM Contact information: [address] [address] [phone]           Duration of Discharge Encounter: Greater than 30 minutes including physician time.  Venetia Night, PA-C 01/29/2018 10:07 AM  EP Attending  Patient seen and examined. See my consult note. [place] for DC home. followup as arranged above.  Mikle Bosworth.D.

## 2018-01-29 ENCOUNTER — Encounter (HOSPITAL_COMMUNITY): Payer: Self-pay | Admitting: Internal Medicine

## 2018-01-29 ENCOUNTER — Ambulatory Visit (HOSPITAL_COMMUNITY): Payer: BLUE CROSS/BLUE SHIELD

## 2018-01-29 DIAGNOSIS — I110 Hypertensive heart disease with heart failure: Secondary | ICD-10-CM | POA: Diagnosis not present

## 2018-01-29 DIAGNOSIS — J45909 Unspecified asthma, uncomplicated: Secondary | ICD-10-CM | POA: Diagnosis not present

## 2018-01-29 DIAGNOSIS — L930 Discoid lupus erythematosus: Secondary | ICD-10-CM | POA: Diagnosis not present

## 2018-01-29 DIAGNOSIS — M797 Fibromyalgia: Secondary | ICD-10-CM | POA: Diagnosis not present

## 2018-01-29 DIAGNOSIS — E099 Drug or chemical induced diabetes mellitus without complications: Secondary | ICD-10-CM | POA: Diagnosis not present

## 2018-01-29 DIAGNOSIS — Z7952 Long term (current) use of systemic steroids: Secondary | ICD-10-CM | POA: Diagnosis not present

## 2018-01-29 DIAGNOSIS — G4733 Obstructive sleep apnea (adult) (pediatric): Secondary | ICD-10-CM | POA: Diagnosis not present

## 2018-01-29 DIAGNOSIS — Z7982 Long term (current) use of aspirin: Secondary | ICD-10-CM | POA: Diagnosis not present

## 2018-01-29 DIAGNOSIS — D688 Other specified coagulation defects: Secondary | ICD-10-CM | POA: Diagnosis not present

## 2018-01-29 DIAGNOSIS — Z006 Encounter for examination for normal comparison and control in clinical research program: Secondary | ICD-10-CM | POA: Diagnosis not present

## 2018-01-29 DIAGNOSIS — Z950 Presence of cardiac pacemaker: Secondary | ICD-10-CM | POA: Diagnosis not present

## 2018-01-29 DIAGNOSIS — Z79899 Other long term (current) drug therapy: Secondary | ICD-10-CM | POA: Diagnosis not present

## 2018-01-29 DIAGNOSIS — Z791 Long term (current) use of non-steroidal anti-inflammatories (NSAID): Secondary | ICD-10-CM | POA: Diagnosis not present

## 2018-01-29 DIAGNOSIS — I428 Other cardiomyopathies: Secondary | ICD-10-CM | POA: Diagnosis not present

## 2018-01-29 DIAGNOSIS — Z794 Long term (current) use of insulin: Secondary | ICD-10-CM | POA: Diagnosis not present

## 2018-01-29 DIAGNOSIS — I509 Heart failure, unspecified: Secondary | ICD-10-CM | POA: Diagnosis not present

## 2018-01-29 DIAGNOSIS — Z8541 Personal history of malignant neoplasm of cervix uteri: Secondary | ICD-10-CM | POA: Diagnosis not present

## 2018-01-29 LAB — BPAM CRYOPRECIPITATE
Blood Product Expiration Date: 201902061450
Blood Product Expiration Date: 201902061450
ISSUE DATE / TIME: 201902061300
ISSUE DATE / TIME: 201902061300
Unit Type and Rh: 6200
Unit Type and Rh: 6200

## 2018-01-29 LAB — PREPARE CRYOPRECIPITATE
Unit division: 0
Unit division: 0

## 2018-01-29 LAB — GLUCOSE, CAPILLARY: Glucose-Capillary: 130 mg/dL — ABNORMAL HIGH (ref 65–99)

## 2018-01-29 MED ORDER — HYDROCODONE-ACETAMINOPHEN 5-325 MG PO TABS: 1.0000 | ORAL_TABLET | Freq: Four times a day (QID) | ORAL | 0 refills | Status: DC | PRN

## 2018-01-29 MED FILL — Cefazolin Sodium-Dextrose IV Solution 2 GM/100ML-4%: INTRAVENOUS | Qty: 100 | Status: AC

## 2018-01-29 MED FILL — Gentamicin Sulfate Inj 40 MG/ML: INTRAMUSCULAR | Qty: 80 | Status: AC

## 2018-01-30 ENCOUNTER — Encounter: Payer: Self-pay | Admitting: Internal Medicine

## 2018-01-31 ENCOUNTER — Encounter: Payer: Self-pay | Admitting: Internal Medicine

## 2018-02-02 ENCOUNTER — Encounter (HOSPITAL_COMMUNITY): Payer: Self-pay | Admitting: *Deleted

## 2018-02-02 NOTE — Progress Notes (Signed)
Received record request from [facility].  Requested records mailed today to [place], [place] [address].  Original request will be scanned to patient's electronic medical record.

## 2018-02-05 ENCOUNTER — Encounter (HOSPITAL_COMMUNITY): Payer: Self-pay | Admitting: *Deleted

## 2018-02-05 DIAGNOSIS — G4733 Obstructive sleep apnea (adult) (pediatric): Secondary | ICD-10-CM | POA: Diagnosis not present

## 2018-02-05 NOTE — Progress Notes (Signed)
Received medical record request from [facility].  Requested records faxed today to [phone].  Original request will be scanned to patient's electronic medical record.

## 2018-02-06 DIAGNOSIS — E119 Type 2 diabetes mellitus without complications: Secondary | ICD-10-CM | POA: Diagnosis not present

## 2018-02-06 DIAGNOSIS — Z96651 Presence of right artificial knee joint: Secondary | ICD-10-CM | POA: Diagnosis not present

## 2018-02-06 DIAGNOSIS — M069 Rheumatoid arthritis, unspecified: Secondary | ICD-10-CM | POA: Diagnosis not present

## 2018-02-06 DIAGNOSIS — I509 Heart failure, unspecified: Secondary | ICD-10-CM | POA: Diagnosis not present

## 2018-02-06 DIAGNOSIS — R319 Hematuria, unspecified: Secondary | ICD-10-CM | POA: Diagnosis not present

## 2018-02-06 DIAGNOSIS — Z7982 Long term (current) use of aspirin: Secondary | ICD-10-CM | POA: Diagnosis not present

## 2018-02-06 DIAGNOSIS — N390 Urinary tract infection, site not specified: Secondary | ICD-10-CM | POA: Diagnosis not present

## 2018-02-06 DIAGNOSIS — J45909 Unspecified asthma, uncomplicated: Secondary | ICD-10-CM | POA: Diagnosis not present

## 2018-02-06 DIAGNOSIS — L930 Discoid lupus erythematosus: Secondary | ICD-10-CM | POA: Diagnosis not present

## 2018-02-06 DIAGNOSIS — I110 Hypertensive heart disease with heart failure: Secondary | ICD-10-CM | POA: Diagnosis not present

## 2018-02-07 ENCOUNTER — Encounter: Payer: Self-pay | Admitting: Adult Health

## 2018-02-07 ENCOUNTER — Encounter (HOSPITAL_COMMUNITY): Payer: Self-pay | Admitting: Internal Medicine

## 2018-02-07 ENCOUNTER — Encounter: Payer: Self-pay | Admitting: Internal Medicine

## 2018-02-09 ENCOUNTER — Ambulatory Visit: Payer: BLUE CROSS/BLUE SHIELD | Admitting: Internal Medicine

## 2018-02-09 ENCOUNTER — Ambulatory Visit (INDEPENDENT_AMBULATORY_CARE_PROVIDER_SITE_OTHER): Payer: BLUE CROSS/BLUE SHIELD | Admitting: *Deleted

## 2018-02-09 ENCOUNTER — Telehealth (HOSPITAL_COMMUNITY): Payer: Self-pay | Admitting: *Deleted

## 2018-02-09 DIAGNOSIS — I5042 Chronic combined systolic (congestive) and diastolic (congestive) heart failure: Secondary | ICD-10-CM

## 2018-02-09 MED ORDER — HYDRALAZINE HCL 50 MG PO TABS: 100.0000 mg | ORAL_TABLET | Freq: Three times a day (TID) | ORAL | 6 refills | Status: DC

## 2018-02-09 NOTE — Telephone Encounter (Signed)
Patient sent the below message through mychart.  I spoke with Dr. Haroldine Laws and he advises patient to increase Hydralazine to 2 Tabs (100mg ) Three Times Daily and continue checking BP's and call us back if that doesn't lower it.  I called patient and she is agreeable with plan.   Message   ----- Message from [place], Generic sent at 02/07/2018 2:43 PM EST -----    Great day,  I am recovering well from the surgery. I have had some issues with my blood pressure the last 3-4 days. Last night it was 170/110. This morning 178/118. I took extra [place] both times. It brings it down a little. After 2 Entresto it is still 147/109. I have not made any changes to diet or my recovery after the defibrillator was put in. The last couple of days by the time I take the second dose of Entresto at 6 pm my blood pressure has been back up to 167/100, 171/98, 157/109. I take it in both arms to make sure. I'm sitting down and still. What do you advise? I have a wound check appointment with doctor Lovena Le Monday at 430. If I need to come and be seen can it be close to that appointment due to being over an hour away?

## 2018-02-10 LAB — CUP PACEART INCLINIC DEVICE CHECK
Date Time Interrogation Session: 20190218050000
HighPow Impedance: 69 Ohm
Implantable Lead Implant Date: 20190206
Implantable Lead Location: 753860
Implantable Lead Model: 292
Implantable Lead Serial Number: 438194
Implantable Pulse Generator Implant Date: 20190206
Lead Channel Impedance Value: 626 Ohm
Lead Channel Pacing Threshold Amplitude: 0.4 V
Lead Channel Pacing Threshold Pulse Width: 0.4 ms
Lead Channel Sensing Intrinsic Amplitude: 25 mV
Lead Channel Setting Pacing Amplitude: 3.5 V
Lead Channel Setting Pacing Pulse Width: 0.4 ms
Lead Channel Setting Sensing Sensitivity: 0.5 mV
Pulse Gen Serial Number: 243572

## 2018-02-10 NOTE — Progress Notes (Signed)
Wound check appointment. Steri-strips removed. Wound without redness or edema. Incision edges approximated, wound well healed. Normal device function. Thresholds, sensing, and impedances consistent with implant measurements. Device programmed at 3.5V for extra safety margin until 3 month visit. Histogram distribution appropriate for patient and level of activity. No ventricular arrhythmias noted. Patient educated about wound care, arm mobility, lifting restrictions, shock plan. ROV 05/20/2018 w/ GT

## 2018-02-16 ENCOUNTER — Encounter: Payer: Self-pay | Admitting: Cardiology

## 2018-02-17 ENCOUNTER — Other Ambulatory Visit (HOSPITAL_COMMUNITY): Payer: Self-pay | Admitting: Student

## 2018-02-17 DIAGNOSIS — M25561 Pain in right knee: Secondary | ICD-10-CM | POA: Diagnosis not present

## 2018-02-18 ENCOUNTER — Encounter: Payer: Self-pay | Admitting: Internal Medicine

## 2018-02-19 DIAGNOSIS — M25561 Pain in right knee: Secondary | ICD-10-CM | POA: Diagnosis not present

## 2018-02-19 NOTE — Telephone Encounter (Signed)
Spoke with pt informed that the note basically stated that the device was functioning normally and the measurements that were taken on that day of her lead were consistent with implant and that it what we expect and want. Pt voiced understanding

## 2018-02-20 ENCOUNTER — Other Ambulatory Visit (HOSPITAL_COMMUNITY): Payer: Self-pay

## 2018-02-20 ENCOUNTER — Encounter (HOSPITAL_COMMUNITY): Payer: Self-pay | Admitting: Internal Medicine

## 2018-02-20 MED ORDER — SACUBITRIL-VALSARTAN 97-103 MG PO TABS: 1.0000 | ORAL_TABLET | Freq: Two times a day (BID) | ORAL | 3 refills | Status: DC

## 2018-02-23 DIAGNOSIS — M25561 Pain in right knee: Secondary | ICD-10-CM | POA: Diagnosis not present

## 2018-02-24 ENCOUNTER — Telehealth: Payer: Self-pay | Admitting: Cardiovascular Disease

## 2018-02-24 NOTE — Telephone Encounter (Signed)
Called the patient and LVM to call back to schedule with Dr. Evette Georges sleep clinic.  His first opening is 05-13-18.  If she needs anything for CPAP she will need to contact Barry Brunner.

## 2018-02-26 ENCOUNTER — Encounter: Payer: Self-pay | Admitting: Internal Medicine

## 2018-02-26 DIAGNOSIS — Z95810 Presence of automatic (implantable) cardiac defibrillator: Secondary | ICD-10-CM | POA: Diagnosis not present

## 2018-02-26 DIAGNOSIS — M328 Other forms of systemic lupus erythematosus: Secondary | ICD-10-CM | POA: Diagnosis not present

## 2018-02-26 DIAGNOSIS — I5022 Chronic systolic (congestive) heart failure: Secondary | ICD-10-CM | POA: Diagnosis not present

## 2018-02-26 DIAGNOSIS — E119 Type 2 diabetes mellitus without complications: Secondary | ICD-10-CM | POA: Diagnosis not present

## 2018-02-26 DIAGNOSIS — M25561 Pain in right knee: Secondary | ICD-10-CM | POA: Diagnosis not present

## 2018-02-26 NOTE — Telephone Encounter (Signed)
Christine Mcdowell checked her BP while on the phone- 145/94 (previous readings at HF Clinic are similar) she has not taken her medications yet this morning. Her vision started becoming cloudy in her right eye 1 week after ICD implant but it has worsened. I advised her to take her medications as directed. I will make Dr. Lovena Le aware but this does not sound like it is related to the ICD. All EGMs from ICD transmitted remotely show regular R-R intervals. She has a PCP appt this afternoon and will review the symptoms with her provider. She verbalizes understanding and is agreeable.   Dr. Lovena Le agrees with previous recommendations.

## 2018-03-04 DIAGNOSIS — Z7952 Long term (current) use of systemic steroids: Secondary | ICD-10-CM | POA: Diagnosis not present

## 2018-03-04 DIAGNOSIS — Z79899 Other long term (current) drug therapy: Secondary | ICD-10-CM | POA: Diagnosis not present

## 2018-03-04 DIAGNOSIS — M329 Systemic lupus erythematosus, unspecified: Secondary | ICD-10-CM | POA: Diagnosis not present

## 2018-03-04 DIAGNOSIS — M358 Other specified systemic involvement of connective tissue: Secondary | ICD-10-CM | POA: Diagnosis not present

## 2018-03-05 DIAGNOSIS — G4733 Obstructive sleep apnea (adult) (pediatric): Secondary | ICD-10-CM | POA: Diagnosis not present

## 2018-03-11 DIAGNOSIS — M25561 Pain in right knee: Secondary | ICD-10-CM | POA: Diagnosis not present

## 2018-03-17 ENCOUNTER — Encounter (HOSPITAL_COMMUNITY): Payer: Self-pay | Admitting: Internal Medicine

## 2018-03-17 ENCOUNTER — Ambulatory Visit (HOSPITAL_COMMUNITY)
Admission: RE | Admit: 2018-03-17 | Discharge: 2018-03-17 | Disposition: A | Discharge status: Home / Self Care | Payer: BLUE CROSS/BLUE SHIELD | Source: Ambulatory Visit | Attending: Internal Medicine | Admitting: Internal Medicine

## 2018-03-17 ENCOUNTER — Other Ambulatory Visit (HOSPITAL_COMMUNITY): Payer: BLUE CROSS/BLUE SHIELD

## 2018-03-17 VITALS — BP 144/86 | HR 99 | Wt 192.0 lb

## 2018-03-17 DIAGNOSIS — I10 Essential (primary) hypertension: Secondary | ICD-10-CM

## 2018-03-17 DIAGNOSIS — Z79899 Other long term (current) drug therapy: Secondary | ICD-10-CM | POA: Diagnosis not present

## 2018-03-17 DIAGNOSIS — D688 Other specified coagulation defects: Secondary | ICD-10-CM | POA: Diagnosis not present

## 2018-03-17 DIAGNOSIS — J45909 Unspecified asthma, uncomplicated: Secondary | ICD-10-CM | POA: Insufficient documentation

## 2018-03-17 DIAGNOSIS — Z7982 Long term (current) use of aspirin: Secondary | ICD-10-CM | POA: Diagnosis not present

## 2018-03-17 DIAGNOSIS — Z8541 Personal history of malignant neoplasm of cervix uteri: Secondary | ICD-10-CM | POA: Diagnosis not present

## 2018-03-17 DIAGNOSIS — G4733 Obstructive sleep apnea (adult) (pediatric): Secondary | ICD-10-CM | POA: Diagnosis not present

## 2018-03-17 DIAGNOSIS — M329 Systemic lupus erythematosus, unspecified: Secondary | ICD-10-CM | POA: Diagnosis not present

## 2018-03-17 DIAGNOSIS — I429 Cardiomyopathy, unspecified: Secondary | ICD-10-CM | POA: Diagnosis not present

## 2018-03-17 DIAGNOSIS — Z7952 Long term (current) use of systemic steroids: Secondary | ICD-10-CM | POA: Insufficient documentation

## 2018-03-17 DIAGNOSIS — M069 Rheumatoid arthritis, unspecified: Secondary | ICD-10-CM | POA: Insufficient documentation

## 2018-03-17 DIAGNOSIS — I110 Hypertensive heart disease with heart failure: Secondary | ICD-10-CM | POA: Diagnosis not present

## 2018-03-17 DIAGNOSIS — Z8249 Family history of ischemic heart disease and other diseases of the circulatory system: Secondary | ICD-10-CM | POA: Insufficient documentation

## 2018-03-17 DIAGNOSIS — Z794 Long term (current) use of insulin: Secondary | ICD-10-CM | POA: Insufficient documentation

## 2018-03-17 DIAGNOSIS — Z8673 Personal history of transient ischemic attack (TIA), and cerebral infarction without residual deficits: Secondary | ICD-10-CM | POA: Diagnosis not present

## 2018-03-17 DIAGNOSIS — I5022 Chronic systolic (congestive) heart failure: Secondary | ICD-10-CM | POA: Insufficient documentation

## 2018-03-17 DIAGNOSIS — Z9889 Other specified postprocedural states: Secondary | ICD-10-CM | POA: Diagnosis not present

## 2018-03-17 DIAGNOSIS — Z95810 Presence of automatic (implantable) cardiac defibrillator: Secondary | ICD-10-CM | POA: Insufficient documentation

## 2018-03-17 DIAGNOSIS — M797 Fibromyalgia: Secondary | ICD-10-CM | POA: Diagnosis not present

## 2018-03-17 DIAGNOSIS — G4730 Sleep apnea, unspecified: Secondary | ICD-10-CM

## 2018-03-17 DIAGNOSIS — E119 Type 2 diabetes mellitus without complications: Secondary | ICD-10-CM | POA: Insufficient documentation

## 2018-03-17 DIAGNOSIS — D571 Sickle-cell disease without crisis: Secondary | ICD-10-CM | POA: Diagnosis not present

## 2018-03-17 DIAGNOSIS — Z90710 Acquired absence of both cervix and uterus: Secondary | ICD-10-CM | POA: Insufficient documentation

## 2018-03-17 DIAGNOSIS — G43909 Migraine, unspecified, not intractable, without status migrainosus: Secondary | ICD-10-CM | POA: Insufficient documentation

## 2018-03-17 MED ORDER — SPIRONOLACTONE 25 MG PO TABS: 25.0000 mg | ORAL_TABLET | Freq: Two times a day (BID) | ORAL | 6 refills | Status: DC

## 2018-03-17 NOTE — Patient Instructions (Signed)
Stop Potassium  Increase Spironolactone to 25 mg Twice daily   Please wear TED Hose daily, put on when you wake up in the morning and remove before going to bed  Your physician recommends that you schedule a follow-up appointment in: 2-3 months

## 2018-03-17 NOTE — Progress Notes (Signed)
ReDS Vest - 03/17/18 1500      ReDS Vest   MR   No    Estimated volume prior to reading  Med    Fitting Posture  Sitting    Height Marker  Tall    Ruler Value  15    Center Strip  Aligned    ReDS Value  32

## 2018-03-17 NOTE — Progress Notes (Signed)
Advanced Heart Failure Clinic Note    Primary Cardiologist:  Dr Johnsie Cancel  Rheumatologist: Dr Eliberto Ivory  HF: Dr. Haroldine Laws   HPI: Christine Mcdowell is a 47 y.o. 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 S/P R ACL repair 08/2017.   Admitted in October 2017 with increased dyspnea and volume overload. Diuresed with IV lasix and transitioned to lasix 40 mg twice a day. LHC/RHC normal filling pressures, EF ~20%, and normal coronaries. Discharge weight 184 pounds.   Admitted to [facility] in September for ACL repair. Also received IV lasix due hospital admit.   She returns today for HF follow up. She is now s/p [employer] ICD (01/2018). She has several concerns today. She has had increased edema and abdominal distension since Sunday. Says her feet are really swollen. Weights increased from 185 lbs to 192 lbs at home. She took metolazone Sunday and Monday and says she is not urinating a lot with it. She is SOB with little walking, like from waiting room to clinic. She had an episode of mild, sharp mid-sternal CP yesterday at rest that lasted a few minutes. No associated symptoms. She is no longer wearing CPAP because she needs a new mask and insurance wont pay for it. Compliant with medications. Limits fluid and salt. SBP usually 140s, HR usually around 100 at home.    Device interrogated: HeartLogic level 5. Thoracic impedence is high (dry). No s3. No VT/AF Personally reviewed   Cardiac MRI in 02/2015 showed EF 46%. No LGE.  ECHO 11/2017 --->EF 25-30% RV ok.   CPX 10/2016 Peak VO2: 18.5 (82% predicted peak VO2) VE/VCO2 slope: 30 OUES: 1.75 Peak RER: 1.14.  RHC/LHC 10/11/2016  RA = 4 RV = 24/5 PA = 23/4 (16) PCW = 9 Fick cardiac output/index = 4.4/2.2 SVR = 1580 PVR =  1.5 WU Ao sat = 95% PA sat = 58%, 62% Assessment: 1. Normal  coronary arteries with separate ostial for LAD and LCX 2. Normal right heart pressures 3. NICM with EF ~20% by echo  Review of systems complete and found to be negative unless listed in HPI.    SH:  Social History   Socioeconomic History  . Marital status: Divorced    Spouse name: Not on file  . Number of children: Not on file  . Years of education: Not on file  . Highest education level: Not on file  Occupational History  . Not on file  Social Needs  . Financial resource strain: Not on file  . Food insecurity:    Worry: Not on file    Inability: Not on file  . Transportation needs:    Medical: Not on file    Non-medical: Not on file  Tobacco Use  . Smoking status: Never Smoker  . Smokeless tobacco: Never Used  Substance and Sexual Activity  . Alcohol use: No  . Drug use: No  . Sexual activity: Not Currently    Birth control/protection: Surgical    Comment: hyst  Lifestyle  . Physical activity:    Days per week: Not on file    Minutes per session: Not on file  . Stress: Not on file  Relationships  . Social connections:    Talks on phone: Not on file    Gets together: Not on file    Attends religious service: Not on file    Active member  of club or organization: Not on file    Attends meetings of clubs or organizations: Not on file    Relationship status: Not on file  . Intimate partner violence:    Fear of current or ex partner: Not on file    Emotionally abused: Not on file    Physically abused: Not on file    Forced sexual activity: Not on file  Other Topics Concern  . Not on file  Social History Narrative  . Not on file   FH:  Family History  Problem Relation Age of Onset  . Arthritis Mother   . Heart murmur Mother   . Drug abuse Mother   . Allergies Mother   . Heart attack Father   . Cushing syndrome Father   . Depression Father   . Allergies Father   . Dementia Paternal Grandmother   . Cancer Paternal Grandfather   . Diabetes Maternal  Grandmother   . Hypertension Maternal Grandmother   . Asthma Maternal Grandmother   . Heart attack Maternal Grandfather    Past Medical History:  Diagnosis Date  . AICD (automatic cardioverter/defibrillator) present 01/28/2018  . Anemia   . Anginal pain ([place])   . Asthma   . Cervical cancer ([place])   . CHF (congestive heart failure) ([place])   . Diabetes mellitus without complication ([place])    steroid induced  . Discoid lupus   . Fibromyalgia   . History of blood transfusion "several"   "related to anemia; had some w/hysterectomy also"  . Hx of cardiovascular stress test    ETT-Myoview (9/15):  No ischemia, EF 52%; NORMAL  . Hx of echocardiogram    Echo (9/15):  EF 50-55%, ant HK, Gr 1 DD, mild MR, mild LAE, no effusion  . Hypertension   . Iron deficiency anemia    h/o iron transfusions  . Lupus (systemic lupus erythematosus) ([place])   . Migraine    "a few/year" (07/03/2016)  . Pneumonia 12/2015  . RA (rheumatoid arthritis) ([place])    "all over" (07/03/2016)  . Sickle cell trait ([place])   . Stroke ([place]) 2014 X 1; 2015 X 2; 2016 X 1;    "right side of face more relaxed than the other; rare speech hesitation" (07/03/2016)  . Vaginal Pap smear, abnormal    ASCUS; HPV    Current Outpatient Medications  Medication Sig Dispense Refill  . acetaminophen (TYLENOL) 500 MG tablet Take 1,500 mg by mouth 2 (two) times daily as needed for mild pain or fever.     Marland Kitchen albuterol (ACCUNEB) 1.25 MG/3ML nebulizer solution Take 3 mLs (1.25 mg total) by nebulization every 6 (six) hours as needed for wheezing. (Patient taking differently: Take 1 ampule by nebulization 2 (two) times daily. ) 75 mL 12  . albuterol (PROVENTIL HFA;VENTOLIN HFA) 108 (90 Base) MCG/ACT inhaler Inhale 2 puffs into the lungs daily as needed for shortness of breath.    Marland Kitchen aspirin 81 MG chewable tablet Chew 162 mg by mouth every morning.     . carvedilol (COREG) 6.25 MG tablet Take 1 tablet (6.25 mg total) 2 (two) times daily with a meal by  mouth. 60 tablet 3  . clonazePAM (KLONOPIN) 0.5 MG tablet Take 1 tablet (0.5 mg total) by mouth 3 (three) times daily as needed (Anxiety). 45 tablet 0  . diphenhydrAMINE (BENADRYL) 25 MG tablet Take 75 mg by mouth 3 (three) times daily.     . famotidine (PEPCID) 20 MG tablet One at bedtime (Patient taking  differently: Take 20 mg by mouth at bedtime. One at bedtime) 30 tablet 2  . folic acid (FOLVITE) 1 MG tablet TAKE 1 TABLET BY MOUTH EVERY MORNING 30 tablet 0  . hydrALAZINE (APRESOLINE) 50 MG tablet Take 2 tablets (100 mg total) by mouth 3 (three) times daily. 180 tablet 6  . HYDROcodone-acetaminophen (NORCO/VICODIN) 5-325 MG tablet Take 1 tablet by mouth every 6 (six) hours as needed for severe pain. 10 tablet 0  . hydroxychloroquine (PLAQUENIL) 200 MG tablet Take 200 mg by mouth 2 (two) times daily.    . insulin aspart (NOVOLOG) 100 UNIT/ML injection Inject 5-10 Units into the skin See admin instructions. Only if blood sugar is above 150; on sliding scale    . meloxicam (MOBIC) 15 MG tablet Take 15 mg by mouth daily.     . methotrexate (RHEUMATREX) 2.5 MG tablet Take 20 mg by mouth once a week. On Friday  3  . metolazone (ZAROXOLYN) 2.5 MG tablet Take 1 tablet (2.5 mg total) by mouth as directed. Take one tablet as needed for weight gain of 5 lbs within 3 days. (Patient taking differently: Take 2.5 mg by mouth See admin instructions. Take one tablet as needed for weight gain of 5 lbs within 3 days.) 5 tablet 1  . metolazone (ZAROXOLYN) 2.5 MG tablet TAKE 1 TABLET BY MOUTH AS NEEDED FOR WEIGHT GAIN OF 5 POUNDS WITHIN 3 DAYS AS DIRECTED 5 tablet 3  . metroNIDAZOLE (METROGEL) 0.75 % vaginal gel USE VAGINALLY EVERY NIGHT FOR 5 NIGHTS (Patient taking differently: Place 1 Applicatorful vaginally at bedtime as needed (for symptoms). USE VAGINALLY EVERY NIGHT FOR 5 NIGHTS) 70 g 0  . montelukast (SINGULAIR) 10 MG tablet Take 10 mg by mouth daily.   3  . Multiple Vitamin (MULTIVITAMIN WITH MINERALS) TABS  Take 1 tablet by mouth every morning.     . ondansetron (ZOFRAN) 4 MG tablet Take 1 tablet (4 mg total) by mouth every 8 (eight) hours as needed for nausea or vomiting. 20 tablet 0  . pantoprazole (PROTONIX) 40 MG tablet Take 1 tablet (40 mg total) by mouth daily. Take 30-60 min before first meal of the day 30 tablet 2  . potassium chloride SA (K-DUR,KLOR-CON) 20 MEQ tablet Take 20 mEq by mouth 2 (two) times daily.    . predniSONE (DELTASONE) 20 MG tablet Take 20 mg by mouth daily with breakfast.     . sacubitril-valsartan (ENTRESTO) 97-103 MG Take 1 tablet by mouth 2 (two) times daily. 60 tablet 3  . spironolactone (ALDACTONE) 25 MG tablet Take 1 tablet (25 mg total) by mouth daily. 30 tablet 6  . Tofacitinib Citrate (XELJANZ) 5 MG TABS Take 5 mg by mouth 2 (two) times daily.    Marland Kitchen torsemide (DEMADEX) 20 MG tablet Take 2 tablets (40 mg total) by mouth 2 (two) times daily. 120 tablet 6  . traZODone (DESYREL) 50 MG tablet Take 1 tablet by mouth at bedtime.  5  . TRESIBA FLEXTOUCH 100 UNIT/ML SOPN FlexTouch Pen Inject 10 Units into the skin every evening.   0   No current facility-administered medications for this encounter.    Vitals:   03/17/18 1527  BP: (!) 144/86  Pulse: 99  SpO2: 100%  Weight: 192 lb (87.1 kg)     Wt Readings from Last 3 Encounters:  03/17/18 192 lb (87.1 kg)  01/29/18 185 lb 4.8 oz (84.1 kg)  01/20/18 186 lb (84.4 kg)   PHYSICAL EXAM: General: Well appearing. No resp  difficulty. HEENT: Normal anicteric Neck: Supple. No JVD. Carotids 2+ bilat; no bruits. No thyromegaly or nodule noted. Cor: PMI nondisplaced. RRR, No M/G/R noted Lungs: CTAB, normal effort. Abdomen: Obese Soft, non-tender, non-distended, no HSM. No bruits or masses. +BS  Extremities: No cyanosis, clubbing, or rash. R and LLE 1+ ankle/foot edema Neuro: Alert & orientedx3, cranial nerves grossly intact. moves all 4 extremities w/o difficulty. Affect pleasant   ASSESSMENT & PLAN: 1. Chronic  Systolic Heart Failure - NICM Cath 10/11/16 with normal cors. CPX 10/2016: Peak VO2: 18.5 (82% predicted peak VO2), VE/VCO2 slope: 30. ECHO 12/18 EF 25-30%  - S/P [employer] HeartLogic ICD 01/2018.  - Cardiac MRI in 02/2015 showed EF 46%. No LGE. CPX with very mild HF limitation and no high-risk features. - NYHA class III - Volulme status stable on exam and device. Reds Vest: 32% - Continue torsemide 40 mg twice a day  - Continue metolazone as needed. Reinforced need for daily weights and reviewed use of sliding scale diuretics - Increase spiro to 25 mg BID K 3.7 01/2018 - Continue coreg 6.25 mg BID.  - Continue entresto 97/103 mg BID. - Continue hydralazine 100 mg TID. Not on imdur due to headaches.  - She has been consented for cardiomems, but will plan to monitor her with HeartLogic device for now unless she has frequent hospitalizations for CHF - TED hose for BLE edema 2.  Lupus  - on chronic prednisone. No recent changes.  3. HTN - Elevated today. Home SBP 140s+. Will increase spiro to 25 mg BID.  - Will work on getting her back on CPAP as above.  4. OSA - No longer wearing CPAP - Has had trouble with ordering a new mask. Dr Haroldine Laws will speak with Dr. Radford Pax about getting this reordered.  5. Migraines - Stable today  6. Hypofibrinogenemia- followed at [facility]  Increase spiro to 25 mg BID DC potassium supplement TED hose Follow up 2-3 months Dr. Haroldine Laws will follow up with Dr. Radford Pax on getting CPAP mask reordered.   Georgiana Shore, NP  3:28 PM   Patient seen and examined with the above-signed Advanced Practice Provider and/or Housestaff. I personally reviewed laboratory data, imaging studies and relevant notes. I independently examined the patient and formulated the important aspects of the plan. I have edited the note to reflect any of my changes or salient points. I have personally discussed the plan with the patient and/or family.  Overall seems to be  stable. Likely NYHA II. She does have significant foot/ankle edema but no evidence of fluid overload anywhere else. ICD interrogated personally and Heart Logic score is low. No evidence of fluid overload. ReDS vest also placed and was normal. BP remains high. Will not use amlodipine due to risk of worsening pedal edema. Will stop Kcl and double spiro. Will need to get CPAP replaced. I will d/w Dr. Radford Pax. Labs today and in 1 week.   Glori Bickers, MD  10:21 PM

## 2018-03-20 ENCOUNTER — Telehealth: Payer: Self-pay | Admitting: *Deleted

## 2018-03-20 DIAGNOSIS — G4730 Sleep apnea, unspecified: Secondary | ICD-10-CM

## 2018-03-20 NOTE — Telephone Encounter (Signed)
Order sent to [facility] for a new CPAP mask through her DME with supplies

## 2018-03-20 NOTE — Telephone Encounter (Signed)
-----   Message from Sueanne Margarita, MD sent at 03/18/2018  9:44 AM EDT ----- Gae Bon,  Please order her a new CPAP mask through her DME with supplies  Traci ----- Message ----- From: Jolaine Artist, MD Sent: 03/17/2018  10:23 PM To: Sueanne Margarita, MD  Eyvonne Mechanic - can you help [place] get her a new mask for CPAP. They told her she needed a new appt but doesn't have appt until June. Can you help?  Thanks -db

## 2018-03-27 ENCOUNTER — Telehealth: Payer: Self-pay

## 2018-03-27 NOTE — Telephone Encounter (Signed)
Patient referred to [facility] clinic Christine Mcdowell, HF clinic.  Attempted ICM call to patient and left message to return call with direct number.

## 2018-03-27 NOTE — Telephone Encounter (Signed)
Patient returned call.  She agreed to monthly ICM calls.  Last remote transmission was 03/22/2018 and Heartlogic index is 6 which is < threshold.  She has a small amount of fluid in her feet. Stomach area is another place fluid seems to collect for her. She weighs daily and current weight is 196 lbs. She takes Metolazone for weight gain of 5lbs.  1st ICM remote transmission scheduled for 04/06/2018 but she should call if she has fluid symptoms if needed.  She has direct ICM number.

## 2018-03-31 ENCOUNTER — Other Ambulatory Visit: Payer: Self-pay | Admitting: Internal Medicine

## 2018-03-31 ENCOUNTER — Other Ambulatory Visit: Payer: Self-pay | Admitting: Physician Assistant

## 2018-03-31 ENCOUNTER — Other Ambulatory Visit: Payer: Self-pay | Admitting: Obstetrics & Gynecology

## 2018-04-02 ENCOUNTER — Ambulatory Visit: Payer: BLUE CROSS/BLUE SHIELD | Admitting: Cardiology

## 2018-04-03 NOTE — Telephone Encounter (Deleted)
Patient says she needs office notes from [facility] [place] and [facility] sent to [facility] showing that she was sick continuously with pneumonia and that is why the ER doctor took her off her CPAP. This resulted in her being noncompliant and her insurance will not pay for her to have a new mask.  The order was written by Dr Radford Pax and sent to [facility] on March 20 2018 but has not been acted upon because her insurance has deemed her noncompliant and [facility] has offered her to self pay. I will message medical records Maudie Mercury) to e-mail the patient the PDR form to [email or web address] to get the records so they can be faxed to [facility] to get the patient what she needs.

## 2018-04-03 NOTE — Telephone Encounter (Signed)
Patient says she needs office notes from [facility] [place] and [facility] sent to [facility] showing that she was sick continuously with pneumonia and that is why the ER doctor took her off her CPAP. This resulted in her being noncompliant and her insurance will not pay for her to have a new mask.  The order was written by Dr Radford Pax and sent to [facility] on March 20 2018 but has not been acted upon because her insurance has deemed her noncompliant and [facility] has offered her to self pay. I will message medical records Maudie Mercury) to e-mail the patient the PDR form to [email or web address] to get the records so they can be faxed to [facility] to get the patient what she needs.

## 2018-04-03 NOTE — Telephone Encounter (Signed)
Order sent today again to [facility] spoke to [employer] (compliance) who states patients insurance will not pay for another mask but patient can self pay.

## 2018-04-06 ENCOUNTER — Ambulatory Visit: Payer: BLUE CROSS/BLUE SHIELD | Admitting: Cardiology

## 2018-04-06 NOTE — Telephone Encounter (Signed)
Reached with [facility] spoke to Ebony (compliance) to see what we needed to do to help the patient get a new mask. [facility] informed me that the patient has been non-compliant for more than 30 days ( 10/26/2017) Per [facility] "the patient has to purchase the new mask, be compliant for 30 days then her insurance will pay for her supplies". Our office reached out to the patient informed her that our office could not find any documentation in her chart from the ER doctor that he recommended she should stop her CPAP and unless she has this documentation there is no proof that this happened. Patient understands she has to purchase her mask that could range from $150 to $250 depending on the type of mask she gets. Patient understands she needs to be compliant for 30 days on her CPAP and then the insurance will pay for her supplies. Patient understands but says she can not afford to purchase the new mask and ask if there is any patient assistance to help with getting a new mask. I informed patient that we have assistance to help with getting a new CPAP but  not just a mask. After calling to inquire if her home health agency had any extra mask to donate to the patient [facility] advice for the patient was "to use her old mask to get compliant for 30 days then she will be able to get her new supplies per her insurance." Patient is agreeable to treatment and thankful for call.

## 2018-04-07 ENCOUNTER — Ambulatory Visit (INDEPENDENT_AMBULATORY_CARE_PROVIDER_SITE_OTHER): Payer: BLUE CROSS/BLUE SHIELD

## 2018-04-07 ENCOUNTER — Telehealth: Payer: Self-pay

## 2018-04-07 DIAGNOSIS — Z95810 Presence of automatic (implantable) cardiac defibrillator: Secondary | ICD-10-CM | POA: Diagnosis not present

## 2018-04-07 DIAGNOSIS — I5022 Chronic systolic (congestive) heart failure: Secondary | ICD-10-CM

## 2018-04-07 NOTE — Telephone Encounter (Signed)
Remote ICM transmission received.  Attempted call to patient and left message per DPR to return call.    

## 2018-04-07 NOTE — Progress Notes (Signed)
EPIC Encounter for ICM Monitoring  Patient Name: Christine Mcdowell is a 47 y.o. female Date: 04/07/2018 Primary Care Physican: [name], MD Primary Cardiologist: [place] Electrophysiologist: Druscilla Brownie Weight: 196 lbs at last call       Attempted call to patient and unable to reach.  Left message to return call.  Transmission reviewed.    04/05/2018 Heartlogic HF Index: 8    Prescribed dosage: Torsemide 20 mg take 2 tablets (40 mg total) twice a day. Metolazone 2.5 mg take one tablet as needed for weight gain of 5 lbs within 3 days.  Recommendations: NONE - Unable to reach.  Follow-up plan: ICM clinic phone appointment on 05/07/2018.          Copy of ICM check sent to Dr. Lovena Le.  3 Month Trend     8 Day Data Trend            [name], RN 04/07/2018 3:29 PM

## 2018-04-13 ENCOUNTER — Other Ambulatory Visit (HOSPITAL_COMMUNITY): Payer: Self-pay | Admitting: Student

## 2018-04-18 DIAGNOSIS — M797 Fibromyalgia: Secondary | ICD-10-CM | POA: Diagnosis not present

## 2018-04-18 DIAGNOSIS — Z79899 Other long term (current) drug therapy: Secondary | ICD-10-CM | POA: Diagnosis not present

## 2018-04-18 DIAGNOSIS — Z8673 Personal history of transient ischemic attack (TIA), and cerebral infarction without residual deficits: Secondary | ICD-10-CM | POA: Diagnosis not present

## 2018-04-18 DIAGNOSIS — E119 Type 2 diabetes mellitus without complications: Secondary | ICD-10-CM | POA: Diagnosis not present

## 2018-04-18 DIAGNOSIS — M069 Rheumatoid arthritis, unspecified: Secondary | ICD-10-CM | POA: Diagnosis not present

## 2018-04-18 DIAGNOSIS — I110 Hypertensive heart disease with heart failure: Secondary | ICD-10-CM | POA: Diagnosis not present

## 2018-04-18 DIAGNOSIS — Z794 Long term (current) use of insulin: Secondary | ICD-10-CM | POA: Diagnosis not present

## 2018-04-18 DIAGNOSIS — R0602 Shortness of breath: Secondary | ICD-10-CM | POA: Diagnosis not present

## 2018-04-18 DIAGNOSIS — I509 Heart failure, unspecified: Secondary | ICD-10-CM | POA: Diagnosis not present

## 2018-04-18 DIAGNOSIS — R531 Weakness: Secondary | ICD-10-CM | POA: Diagnosis not present

## 2018-04-18 DIAGNOSIS — Z7982 Long term (current) use of aspirin: Secondary | ICD-10-CM | POA: Diagnosis not present

## 2018-04-18 DIAGNOSIS — E876 Hypokalemia: Secondary | ICD-10-CM | POA: Diagnosis not present

## 2018-04-22 ENCOUNTER — Other Ambulatory Visit (HOSPITAL_COMMUNITY): Payer: Self-pay | Admitting: Adult Health

## 2018-04-22 ENCOUNTER — Encounter (HOSPITAL_COMMUNITY): Payer: Self-pay

## 2018-04-28 MED ORDER — TORSEMIDE 20 MG PO TABS: 60.0000 mg | ORAL_TABLET | Freq: Two times a day (BID) | ORAL | 6 refills | Status: DC

## 2018-04-28 NOTE — Telephone Encounter (Signed)
Per Amy patient to keep follow up on 5/29 but increase Torsemide to 60bid until appointment. Pt aware and agreeable with plan.

## 2018-05-07 ENCOUNTER — Ambulatory Visit (INDEPENDENT_AMBULATORY_CARE_PROVIDER_SITE_OTHER): Payer: BLUE CROSS/BLUE SHIELD

## 2018-05-07 DIAGNOSIS — Z95810 Presence of automatic (implantable) cardiac defibrillator: Secondary | ICD-10-CM

## 2018-05-07 DIAGNOSIS — I5022 Chronic systolic (congestive) heart failure: Secondary | ICD-10-CM

## 2018-05-07 NOTE — Progress Notes (Signed)
EPIC Encounter for ICM Monitoring  Patient Name: Christine Mcdowell is a 47 y.o. female Date: 05/07/2018 Primary Care Physican: [name], MD Primary Cardiologist: [place] Electrophysiologist: Druscilla Brownie Weight:    196 lbs at last call           Attempted patient call and unable to reach. Transmission reviewed.   Per my chart message 5/1 to Dr Haroldine Laws, patient was in [facility] ER for SOB and severe swelling.  Her potassium and magnesium were low.  Heart logic did not cross threshold index in any in the last month.   05/06/2018 Heartlogic HF Index: 3 suggesting normal    Prescribed dosage: Torsemide 20 mg take 3 tablets (60 mg total) twice a day. (increased on 04/22/2018). Metolazone 2.5 mg take one tablet as needed for weight gain of 5 lbs within 3 days.  Labs: 01/31/2018 Creatinine 0.88, BUN 13, Potassium 3.7, Sodium 137, EGFR 77-89   Recommendations: NONE - Unable to reach.  Follow-up plan: ICM clinic phone appointment on 06/22/2018 since there is an office appointment scheduled for defib check on 05/20/2018 with Dr. Lovena Le and HF clinic NP/PA.        Copy of ICM check sent to Dr. Lovena Le.  3 Month Trend    8 Day Data Trend            [name], RN 05/07/2018 9:06 AM

## 2018-05-08 ENCOUNTER — Telehealth: Payer: Self-pay

## 2018-05-08 NOTE — Telephone Encounter (Signed)
Remote ICM transmission received.  Attempted call to patient and left message to return call. 

## 2018-05-13 ENCOUNTER — Encounter: Payer: BLUE CROSS/BLUE SHIELD | Admitting: Internal Medicine

## 2018-05-15 DIAGNOSIS — E109 Type 1 diabetes mellitus without complications: Secondary | ICD-10-CM | POA: Diagnosis not present

## 2018-05-19 ENCOUNTER — Encounter (HOSPITAL_COMMUNITY): Payer: BLUE CROSS/BLUE SHIELD

## 2018-05-19 NOTE — Progress Notes (Addendum)
Advanced Heart Failure Clinic Note    Primary Cardiologist:  Dr Johnsie Cancel  Rheumatologist: Dr Eliberto Ivory  HF: Dr. Haroldine Laws   HPI: Christine Mcdowell is a 47 y.o. 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 S/P R ACL repair 08/2017.   Admitted in October 2017 with increased dyspnea and volume overload. Diuresed with IV lasix and transitioned to lasix 40 mg twice a day. LHC/RHC normal filling pressures, EF ~20%, and normal coronaries. Discharge weight 184 pounds.   Admitted to [facility] in September for ACL repair. Also received IV lasix due hospital admit.   She presents today for regular follow up. Last visit spiro increased. Has been feeling OK overall. Taking metolazone once every 1-2 weeks. Mild SOB with activity, usually OK on flat ground. She denies CP. She is having occasional palpitations, and by ICD interrogation is having episode of VT into the 170 bpm range.  She has been using CPAP, and is awaiting a new mask. Reports compliance with all of her medications, as well as salt and fluid restriction.   Device interrogated: Heart Logic 5, HF indices stable.  She has multiple episodes of VT, clustered. Most recently had 8 episodes on 05/10/18 lasting 0:45 seconds to 1:10 seconds across an hour time period. Has has multiple episodes each month.   Cardiac MRI in 02/2015 showed EF 46%. No LGE.  ECHO 11/2017 --->EF 25-30% RV ok.   CPX 10/2016 Peak VO2: 18.5 (82% predicted peak VO2) VE/VCO2 slope: 30 OUES: 1.75 Peak RER: 1.14.  RHC/LHC 10/11/2016  RA = 4 RV = 24/5 PA = 23/4 (16) PCW = 9 Fick cardiac output/index = 4.4/2.2 SVR = 1580 PVR =  1.5 WU Ao sat = 95% PA sat = 58%, 62% Assessment: 1. Normal coronary arteries with separate ostial for LAD and LCX 2. Normal right heart pressures 3. NICM with EF ~20% by echo  Review of  systems complete and found to be negative unless listed in HPI.    SH:  Social History   Socioeconomic History  . Marital status: Divorced    Spouse name: Not on file  . Number of children: Not on file  . Years of education: Not on file  . Highest education level: Not on file  Occupational History  . Not on file  Social Needs  . Financial resource strain: Not on file  . Food insecurity:    Worry: Not on file    Inability: Not on file  . Transportation needs:    Medical: Not on file    Non-medical: Not on file  Tobacco Use  . Smoking status: Never Smoker  . Smokeless tobacco: Never Used  Substance and Sexual Activity  . Alcohol use: No  . Drug use: No  . Sexual activity: Not Currently    Birth control/protection: Surgical    Comment: hyst  Lifestyle  . Physical activity:    Days per week: Not on file    Minutes per session: Not on file  . Stress: Not on file  Relationships  . Social connections:    Talks on phone: Not on file    Gets together: Not on file    Attends religious service: Not on file    Active member of club or organization: Not on file    Attends meetings of clubs or organizations: Not on file    Relationship status: Not on  file  . Intimate partner violence:    Fear of current or ex partner: Not on file    Emotionally abused: Not on file    Physically abused: Not on file    Forced sexual activity: Not on file  Other Topics Concern  . Not on file  Social History Narrative  . Not on file   FH:  Family History  Problem Relation Age of Onset  . Arthritis Mother   . Heart murmur Mother   . Drug abuse Mother   . Allergies Mother   . Heart attack Father   . Cushing syndrome Father   . Depression Father   . Allergies Father   . Dementia Paternal Grandmother   . Cancer Paternal Grandfather   . Diabetes Maternal Grandmother   . Hypertension Maternal Grandmother   . Asthma Maternal Grandmother   . Heart attack Maternal Grandfather    Past  Medical History:  Diagnosis Date  . AICD (automatic cardioverter/defibrillator) present 01/28/2018  . Anemia   . Anginal pain ([place])   . Asthma   . Cervical cancer ([place])   . CHF (congestive heart failure) ([place])   . Diabetes mellitus without complication ([place])    steroid induced  . Discoid lupus   . Fibromyalgia   . History of blood transfusion "several"   "related to anemia; had some w/hysterectomy also"  . Hx of cardiovascular stress test    ETT-Myoview (9/15):  No ischemia, EF 52%; NORMAL  . Hx of echocardiogram    Echo (9/15):  EF 50-55%, ant HK, Gr 1 DD, mild MR, mild LAE, no effusion  . Hypertension   . Iron deficiency anemia    h/o iron transfusions  . Lupus (systemic lupus erythematosus) ([place])   . Migraine    "a few/year" (07/03/2016)  . Pneumonia 12/2015  . RA (rheumatoid arthritis) ([place])    "all over" (07/03/2016)  . Sickle cell trait ([place])   . Stroke ([place]) 2014 X 1; 2015 X 2; 2016 X 1;    "right side of face more relaxed than the other; rare speech hesitation" (07/03/2016)  . Vaginal Pap smear, abnormal    ASCUS; HPV    Current Outpatient Medications  Medication Sig Dispense Refill  . acetaminophen (TYLENOL) 500 MG tablet Take 1,500 mg by mouth 2 (two) times daily as needed for mild pain or fever.     Marland Kitchen albuterol (ACCUNEB) 1.25 MG/3ML nebulizer solution Take 3 mLs (1.25 mg total) by nebulization every 6 (six) hours as needed for wheezing. (Patient taking differently: Take 1 ampule by nebulization 2 (two) times daily. ) 75 mL 12  . albuterol (PROVENTIL HFA;VENTOLIN HFA) 108 (90 Base) MCG/ACT inhaler Inhale 2 puffs into the lungs daily as needed for shortness of breath.    Marland Kitchen aspirin 81 MG chewable tablet Chew 162 mg by mouth every morning.     . carvedilol (COREG) 25 MG tablet TAKE 1 TABLET(25 MG) BY MOUTH TWICE DAILY WITH A MEAL 180 tablet 2  . clonazePAM (KLONOPIN) 0.5 MG tablet Take 1 tablet (0.5 mg total) by mouth 3 (three) times daily as needed (Anxiety). 45 tablet 0    . diphenhydrAMINE (BENADRYL) 25 MG tablet Take 75 mg by mouth 3 (three) times daily.     . famotidine (PEPCID) 20 MG tablet One at bedtime (Patient taking differently: Take 20 mg by mouth at bedtime. One at bedtime) 30 tablet 2  . folic acid (FOLVITE) 1 MG tablet TAKE 1 TABLET BY MOUTH EVERY MORNING  30 tablet 0  . hydrALAZINE (APRESOLINE) 50 MG tablet Take 2 tablets (100 mg total) by mouth 3 (three) times daily. 180 tablet 6  . HYDROcodone-acetaminophen (NORCO/VICODIN) 5-325 MG tablet Take 1 tablet by mouth every 6 (six) hours as needed for severe pain. 10 tablet 0  . hydroxychloroquine (PLAQUENIL) 200 MG tablet Take 200 mg by mouth 2 (two) times daily.    . insulin aspart (NOVOLOG) 100 UNIT/ML injection Inject 5-10 Units into the skin See admin instructions. Only if blood sugar is above 150; on sliding scale    . meloxicam (MOBIC) 15 MG tablet Take 15 mg by mouth daily.     . methotrexate (RHEUMATREX) 2.5 MG tablet Take 20 mg by mouth once a week. On Friday  3  . metolazone (ZAROXOLYN) 2.5 MG tablet TAKE 1 TABLET BY MOUTH AS NEEDED FOR WEIGHT GAIN OF 5 POUNDS WITHIN 3 DAYS AS DIRECTED 5 tablet 3  . metroNIDAZOLE (METROGEL) 0.75 % vaginal gel USE VAGINALLY EVERY NIGHT FOR 5 NIGHTS 70 g 0  . montelukast (SINGULAIR) 10 MG tablet Take 10 mg by mouth daily.   3  . Multiple Vitamin (MULTIVITAMIN WITH MINERALS) TABS Take 1 tablet by mouth every morning.     . ondansetron (ZOFRAN) 4 MG tablet Take 1 tablet (4 mg total) by mouth every 8 (eight) hours as needed for nausea or vomiting. 20 tablet 0  . pantoprazole (PROTONIX) 40 MG tablet Take 1 tablet (40 mg total) by mouth daily. Take 30-60 min before first meal of the day 30 tablet 2  . predniSONE (DELTASONE) 20 MG tablet Take 20 mg by mouth daily with breakfast.     . sacubitril-valsartan (ENTRESTO) 97-103 MG Take 1 tablet by mouth 2 (two) times daily. 60 tablet 3  . spironolactone (ALDACTONE) 25 MG tablet Take 1 tablet (25 mg total) by mouth 2 (two)  times daily. 60 tablet 6  . Tofacitinib Citrate (XELJANZ) 5 MG TABS Take 5 mg by mouth 2 (two) times daily.    Marland Kitchen torsemide (DEMADEX) 20 MG tablet Take 3 tablets (60 mg total) by mouth 2 (two) times daily. 180 tablet 6  . traZODone (DESYREL) 50 MG tablet Take 1 tablet by mouth at bedtime.  5  . TRESIBA FLEXTOUCH 100 UNIT/ML SOPN FlexTouch Pen Inject 10 Units into the skin every evening.   0   No current facility-administered medications for this encounter.    Vitals:   05/20/18 1412  BP: (!) 148/88  Pulse: 90  SpO2: 98%  Weight: 192 lb (87.1 kg)     Wt Readings from Last 3 Encounters:  05/20/18 192 lb (87.1 kg)  03/17/18 192 lb (87.1 kg)  01/29/18 185 lb 4.8 oz (84.1 kg)   PHYSICAL EXAM:  General: Well appearing. No resp difficulty. HEENT: Normal Neck: Supple. JVP 5-6. Carotids 2+ bilat; no bruits. No thyromegaly or nodule noted. Cor: PMI nondisplaced. RRR, No M/G/R noted Lungs: CTAB, normal effort. Abdomen: Soft, non-tender, non-distended, no HSM. No bruits or masses. +BS  Extremities: No cyanosis, clubbing, or rash. Trace ankle edema.  Neuro: Alert & orientedx3, cranial nerves grossly intact. moves all 4 extremities w/o difficulty. Affect pleasant   ASSESSMENT & PLAN: 1. Chronic Systolic Heart Failure - NICM Cath 10/11/16 with normal cors. CPX 10/2016: Peak VO2: 18.5 (82% predicted peak VO2), VE/VCO2 slope: 30. ECHO 12/18 EF 25-30%  - S/P [employer] HeartLogic ICD 01/2018.  - Cardiac MRI in 02/2015 showed EF 46%. No LGE. CPX with very mild HF limitation  and no high-risk features. - NYHA II-III symptoms - Volume status stable on exam.   - Continue torsemide 60 mg twice a day BMET today.  - Continue metolazone as needed. Reinforced need for daily weights and reviewed use of sliding scale diuretics - Continue spiro 25 mg BID.  - Continue coreg 6.25 mg BID.  - Continue entresto 97/103 mg BID. - Continue hydralazine 100 mg TID. Not on imdur due to headaches.  - She has  been consented for cardiomems, but will plan to monitor her with HeartLogic device for now unless she has frequent hospitalizations for CHF - Reinforced fluid restriction to < 2 L daily, sodium restriction to less than 2000 mg daily, and the importance of daily weights.   2. VT/SVT - Multiple clustered episodes noted on ICD interrogation.  - BMET and Mg today. Sees EP this afternoon.  - *addendum* Dr. Haroldine Laws discussed Dr. Lovena Le personally. Appears to be Sinus Tach vs Aflutter. No change at this time.  3.  Lupus  - on chronic prednisone.  - No change. Per PCP.  4. HTN - Continue spiro 25 mg BID 5. OSA - Now using CPAP and awaiting "mask switch". 6. Migraines - Stable. Per PCP.   7. Hypofibrinogenemia - Followed at [facility]  Return to CHF clinic 2-3 months. Sees Dr. Lovena Le this afternoon. Will sign and forward note. Pt given a print out of her VT/SVT to have EP review. We will gladly see back sooner if deemed necessary by EP.   Shirley Friar, PA-C  2:16 PM   Greater than 50% of the 25 minute visit was spent in counseling/coordination of care regarding disease state education, salt/fluid restriction, sliding scale diuretics, and medication compliance.

## 2018-05-20 ENCOUNTER — Encounter (HOSPITAL_COMMUNITY): Payer: Self-pay

## 2018-05-20 ENCOUNTER — Ambulatory Visit (HOSPITAL_COMMUNITY)
Admission: RE | Admit: 2018-05-20 | Discharge: 2018-05-20 | Disposition: A | Discharge status: Home / Self Care | Payer: BLUE CROSS/BLUE SHIELD | Source: Ambulatory Visit | Attending: Internal Medicine | Admitting: Internal Medicine

## 2018-05-20 ENCOUNTER — Ambulatory Visit (INDEPENDENT_AMBULATORY_CARE_PROVIDER_SITE_OTHER): Payer: BLUE CROSS/BLUE SHIELD | Admitting: Internal Medicine

## 2018-05-20 ENCOUNTER — Encounter: Payer: Self-pay | Admitting: Internal Medicine

## 2018-05-20 VITALS — BP 148/88 | HR 90 | Wt 192.0 lb

## 2018-05-20 VITALS — BP 126/84 | HR 92 | Ht 67.0 in | Wt 192.4 lb

## 2018-05-20 DIAGNOSIS — Z7982 Long term (current) use of aspirin: Secondary | ICD-10-CM | POA: Insufficient documentation

## 2018-05-20 DIAGNOSIS — Z09 Encounter for follow-up examination after completed treatment for conditions other than malignant neoplasm: Secondary | ICD-10-CM | POA: Diagnosis not present

## 2018-05-20 DIAGNOSIS — Z791 Long term (current) use of non-steroidal anti-inflammatories (NSAID): Secondary | ICD-10-CM | POA: Insufficient documentation

## 2018-05-20 DIAGNOSIS — D573 Sickle-cell trait: Secondary | ICD-10-CM | POA: Insufficient documentation

## 2018-05-20 DIAGNOSIS — E119 Type 2 diabetes mellitus without complications: Secondary | ICD-10-CM | POA: Insufficient documentation

## 2018-05-20 DIAGNOSIS — Z79891 Long term (current) use of opiate analgesic: Secondary | ICD-10-CM | POA: Insufficient documentation

## 2018-05-20 DIAGNOSIS — I420 Dilated cardiomyopathy: Secondary | ICD-10-CM | POA: Diagnosis not present

## 2018-05-20 DIAGNOSIS — I472 Ventricular tachycardia, unspecified: Secondary | ICD-10-CM

## 2018-05-20 DIAGNOSIS — Z8249 Family history of ischemic heart disease and other diseases of the circulatory system: Secondary | ICD-10-CM | POA: Diagnosis not present

## 2018-05-20 DIAGNOSIS — I110 Hypertensive heart disease with heart failure: Secondary | ICD-10-CM | POA: Insufficient documentation

## 2018-05-20 DIAGNOSIS — Z7952 Long term (current) use of systemic steroids: Secondary | ICD-10-CM | POA: Insufficient documentation

## 2018-05-20 DIAGNOSIS — Z8261 Family history of arthritis: Secondary | ICD-10-CM | POA: Insufficient documentation

## 2018-05-20 DIAGNOSIS — I5022 Chronic systolic (congestive) heart failure: Secondary | ICD-10-CM

## 2018-05-20 DIAGNOSIS — Z8541 Personal history of malignant neoplasm of cervix uteri: Secondary | ICD-10-CM | POA: Diagnosis not present

## 2018-05-20 DIAGNOSIS — M329 Systemic lupus erythematosus, unspecified: Secondary | ICD-10-CM

## 2018-05-20 DIAGNOSIS — Z825 Family history of asthma and other chronic lower respiratory diseases: Secondary | ICD-10-CM | POA: Diagnosis not present

## 2018-05-20 DIAGNOSIS — Z8739 Personal history of other diseases of the musculoskeletal system and connective tissue: Secondary | ICD-10-CM | POA: Insufficient documentation

## 2018-05-20 DIAGNOSIS — I10 Essential (primary) hypertension: Secondary | ICD-10-CM

## 2018-05-20 DIAGNOSIS — Z813 Family history of other psychoactive substance abuse and dependence: Secondary | ICD-10-CM | POA: Diagnosis not present

## 2018-05-20 DIAGNOSIS — G4733 Obstructive sleep apnea (adult) (pediatric): Secondary | ICD-10-CM | POA: Diagnosis not present

## 2018-05-20 DIAGNOSIS — Z8489 Family history of other specified conditions: Secondary | ICD-10-CM | POA: Insufficient documentation

## 2018-05-20 DIAGNOSIS — J45909 Unspecified asthma, uncomplicated: Secondary | ICD-10-CM | POA: Diagnosis not present

## 2018-05-20 DIAGNOSIS — Z79899 Other long term (current) drug therapy: Secondary | ICD-10-CM | POA: Diagnosis not present

## 2018-05-20 DIAGNOSIS — Z95810 Presence of automatic (implantable) cardiac defibrillator: Secondary | ICD-10-CM | POA: Insufficient documentation

## 2018-05-20 DIAGNOSIS — G43909 Migraine, unspecified, not intractable, without status migrainosus: Secondary | ICD-10-CM | POA: Diagnosis not present

## 2018-05-20 DIAGNOSIS — Z8673 Personal history of transient ischemic attack (TIA), and cerebral infarction without residual deficits: Secondary | ICD-10-CM | POA: Diagnosis not present

## 2018-05-20 DIAGNOSIS — Z809 Family history of malignant neoplasm, unspecified: Secondary | ICD-10-CM | POA: Insufficient documentation

## 2018-05-20 DIAGNOSIS — I471 Supraventricular tachycardia: Secondary | ICD-10-CM | POA: Insufficient documentation

## 2018-05-20 DIAGNOSIS — M069 Rheumatoid arthritis, unspecified: Secondary | ICD-10-CM | POA: Insufficient documentation

## 2018-05-20 DIAGNOSIS — I5020 Unspecified systolic (congestive) heart failure: Secondary | ICD-10-CM | POA: Diagnosis not present

## 2018-05-20 DIAGNOSIS — G4730 Sleep apnea, unspecified: Secondary | ICD-10-CM | POA: Diagnosis not present

## 2018-05-20 DIAGNOSIS — D688 Other specified coagulation defects: Secondary | ICD-10-CM | POA: Diagnosis not present

## 2018-05-20 DIAGNOSIS — Z794 Long term (current) use of insulin: Secondary | ICD-10-CM | POA: Insufficient documentation

## 2018-05-20 DIAGNOSIS — Z90710 Acquired absence of both cervix and uterus: Secondary | ICD-10-CM | POA: Insufficient documentation

## 2018-05-20 DIAGNOSIS — Z833 Family history of diabetes mellitus: Secondary | ICD-10-CM | POA: Insufficient documentation

## 2018-05-20 DIAGNOSIS — I429 Cardiomyopathy, unspecified: Secondary | ICD-10-CM | POA: Diagnosis not present

## 2018-05-20 DIAGNOSIS — Z792 Long term (current) use of antibiotics: Secondary | ICD-10-CM | POA: Insufficient documentation

## 2018-05-20 LAB — MAGNESIUM: Magnesium: 1.6 mg/dL — ABNORMAL LOW (ref 1.7–2.4)

## 2018-05-20 LAB — BASIC METABOLIC PANEL
Anion gap: 8 (ref 5–15)
BUN: 14 mg/dL (ref 6–20)
CO2: 27 mmol/L (ref 22–32)
Calcium: 9.3 mg/dL (ref 8.9–10.3)
Chloride: 105 mmol/L (ref 101–111)
Creatinine, Ser: 1.03 mg/dL — ABNORMAL HIGH (ref 0.44–1.00)
GFR calc Af Amer: >60 mL/min (ref >60)
GFR calc non Af Amer: >60 mL/min (ref >60)
Glucose, Bld: 160 mg/dL — ABNORMAL HIGH (ref 65–99)
Potassium: 3.9 mmol/L (ref 3.5–5.1)
Sodium: 140 mmol/L (ref 135–145)

## 2018-05-20 NOTE — Progress Notes (Signed)
HPI Ms. Bing returns today for ongoing ICD followup. She is a pleasant 47 yo woman with a DCM, chronic systolic heart failure on medical therapy directed by our CHF clinic. She notes peripheral edema. No chest pain. She has occaisional incisional pain. No syncope. No ICD shocks. Allergies  Allergen Reactions  . Hydromorphone Nausea And Vomiting    Other reaction(s): GI Upset (intolerance), Hypertension (intolerance) Raises blood pressure   . Iodinated Diagnostic Agents Other (See Comments)    Shuts down kidneys  . Other Other (See Comments) and Anaphylaxis    Spicy foods and seasonings Skin Prep "makes my skin peel off" Paper tape causes skin burns  . Erythromycin Nausea And Vomiting  . Latex Hives  . Mircette [Desogestrel-Ethinyl Estradiol] Nausea And Vomiting and Rash     Current Outpatient Medications  Medication Sig Dispense Refill  . acetaminophen (TYLENOL) 500 MG tablet Take 1,500 mg by mouth 2 (two) times daily as needed for mild pain or fever.     Marland Kitchen albuterol (ACCUNEB) 1.25 MG/3ML nebulizer solution Take 3 mLs (1.25 mg total) by nebulization every 6 (six) hours as needed for wheezing. (Patient taking differently: Take 1 ampule by nebulization 2 (two) times daily. ) 75 mL 12  . albuterol (PROVENTIL HFA;VENTOLIN HFA) 108 (90 Base) MCG/ACT inhaler Inhale 2 puffs into the lungs daily as needed for shortness of breath.    Marland Kitchen aspirin 81 MG chewable tablet Chew 162 mg by mouth every morning.     . carvedilol (COREG) 25 MG tablet TAKE 1 TABLET(25 MG) BY MOUTH TWICE DAILY WITH A MEAL 180 tablet 2  . clonazePAM (KLONOPIN) 0.5 MG tablet Take 1 tablet (0.5 mg total) by mouth 3 (three) times daily as needed (Anxiety). 45 tablet 0  . diphenhydrAMINE (BENADRYL) 25 MG tablet Take 75 mg by mouth 3 (three) times daily.     . famotidine (PEPCID) 20 MG tablet One at bedtime (Patient taking differently: Take 20 mg by mouth at bedtime. One at bedtime) 30 tablet 2  . folic acid (FOLVITE)  1 MG tablet TAKE 1 TABLET BY MOUTH EVERY MORNING 30 tablet 0  . hydrALAZINE (APRESOLINE) 50 MG tablet Take 2 tablets (100 mg total) by mouth 3 (three) times daily. 180 tablet 6  . HYDROcodone-acetaminophen (NORCO/VICODIN) 5-325 MG tablet Take 1 tablet by mouth every 6 (six) hours as needed for severe pain. 10 tablet 0  . hydroxychloroquine (PLAQUENIL) 200 MG tablet Take 200 mg by mouth 2 (two) times daily.    . insulin aspart (NOVOLOG) 100 UNIT/ML injection Inject 5-10 Units into the skin See admin instructions. Only if blood sugar is above 150; on sliding scale    . meloxicam (MOBIC) 15 MG tablet Take 15 mg by mouth daily.     . methotrexate (RHEUMATREX) 2.5 MG tablet Take 20 mg by mouth once a week. On Friday  3  . metolazone (ZAROXOLYN) 2.5 MG tablet TAKE 1 TABLET BY MOUTH AS NEEDED FOR WEIGHT GAIN OF 5 POUNDS WITHIN 3 DAYS AS DIRECTED 5 tablet 3  . metroNIDAZOLE (METROGEL) 0.75 % vaginal gel USE VAGINALLY EVERY NIGHT FOR 5 NIGHTS 70 g 0  . montelukast (SINGULAIR) 10 MG tablet Take 10 mg by mouth daily.   3  . Multiple Vitamin (MULTIVITAMIN WITH MINERALS) TABS Take 1 tablet by mouth every morning.     . ondansetron (ZOFRAN) 4 MG tablet Take 1 tablet (4 mg total) by mouth every 8 (eight) hours as needed for nausea or  vomiting. 20 tablet 0  . pantoprazole (PROTONIX) 40 MG tablet Take 1 tablet (40 mg total) by mouth daily. Take 30-60 min before first meal of the day 30 tablet 2  . predniSONE (DELTASONE) 20 MG tablet Take 20 mg by mouth daily with breakfast.     . sacubitril-valsartan (ENTRESTO) 97-103 MG Take 1 tablet by mouth 2 (two) times daily. 60 tablet 3  . spironolactone (ALDACTONE) 25 MG tablet Take 1 tablet (25 mg total) by mouth 2 (two) times daily. 60 tablet 6  . Tofacitinib Citrate (XELJANZ) 5 MG TABS Take 5 mg by mouth 2 (two) times daily.    Marland Kitchen torsemide (DEMADEX) 20 MG tablet Take 3 tablets (60 mg total) by mouth 2 (two) times daily. 180 tablet 6  . traZODone (DESYREL) 50 MG tablet  Take 1 tablet by mouth at bedtime.  5  . TRESIBA FLEXTOUCH 100 UNIT/ML SOPN FlexTouch Pen Inject 10 Units into the skin every evening.   0   No current facility-administered medications for this visit.      Past Medical History:  Diagnosis Date  . AICD (automatic cardioverter/defibrillator) present 01/28/2018  . Anemia   . Anginal pain ([place])   . Asthma   . Cervical cancer ([place])   . CHF (congestive heart failure) ([place])   . Diabetes mellitus without complication ([place])    steroid induced  . Discoid lupus   . Fibromyalgia   . History of blood transfusion "several"   "related to anemia; had some w/hysterectomy also"  . Hx of cardiovascular stress test    ETT-Myoview (9/15):  No ischemia, EF 52%; NORMAL  . Hx of echocardiogram    Echo (9/15):  EF 50-55%, ant HK, Gr 1 DD, mild MR, mild LAE, no effusion  . Hypertension   . Iron deficiency anemia    h/o iron transfusions  . Lupus (systemic lupus erythematosus) ([place])   . Migraine    "a few/year" (07/03/2016)  . Pneumonia 12/2015  . RA (rheumatoid arthritis) ([place])    "all over" (07/03/2016)  . Sickle cell trait ([place])   . Stroke ([place]) 2014 X 1; 2015 X 2; 2016 X 1;    "right side of face more relaxed than the other; rare speech hesitation" (07/03/2016)  . Vaginal Pap smear, abnormal    ASCUS; HPV    ROS:   All systems reviewed and negative except as noted in the HPI.   Past Surgical History:  Procedure Laterality Date  . ABDOMINAL HYSTERECTOMY  2009  . ABDOMINAL WOUND DEHISCENCE  2009  . CARDIAC CATHETERIZATION N/A 10/11/2016   Procedure: Right/Left Heart Cath and Coronary Angiography;  Surgeon: Jolaine Artist, MD;  Location: [facility];  Service: Cardiovascular;  Laterality: N/A;  . DILATION AND CURETTAGE OF UTERUS  1991  . HEMATOMA EVACUATION  2009   abdomen  . ICD IMPLANT  01/28/2018  . ICD IMPLANT N/A 01/28/2018   Procedure: ICD IMPLANT;  Surgeon: Evans Lance, MD;  Location: [facility];  Service:  Cardiovascular;  Laterality: N/A;  . INCISE AND DRAIN ABCESS  2009 X 2   "abdomen after hysterectomy"  . KNEE ARTHROSCOPY Right 1997  . KNEE SURGERY Right   . TUBAL LIGATION  1996     Family History  Problem Relation Age of Onset  . Arthritis Mother   . Heart murmur Mother   . Drug abuse Mother   . Allergies Mother   . Heart attack Father   . Cushing syndrome  Father   . Depression Father   . Allergies Father   . Dementia Paternal Grandmother   . Cancer Paternal Grandfather   . Diabetes Maternal Grandmother   . Hypertension Maternal Grandmother   . Asthma Maternal Grandmother   . Heart attack Maternal Grandfather      Social History   Socioeconomic History  . Marital status: Divorced    Spouse name: Not on file  . Number of children: Not on file  . Years of education: Not on file  . Highest education level: Not on file  Occupational History  . Not on file  Social Needs  . Financial resource strain: Not on file  . Food insecurity:    Worry: Not on file    Inability: Not on file  . Transportation needs:    Medical: Not on file    Non-medical: Not on file  Tobacco Use  . Smoking status: Never Smoker  . Smokeless tobacco: Never Used  Substance and Sexual Activity  . Alcohol use: No  . Drug use: No  . Sexual activity: Not Currently    Birth control/protection: Surgical    Comment: hyst  Lifestyle  . Physical activity:    Days per week: Not on file    Minutes per session: Not on file  . Stress: Not on file  Relationships  . Social connections:    Talks on phone: Not on file    Gets together: Not on file    Attends religious service: Not on file    Active member of club or organization: Not on file    Attends meetings of clubs or organizations: Not on file    Relationship status: Not on file  . Intimate partner violence:    Fear of current or ex partner: Not on file    Emotionally abused: Not on file    Physically abused: Not on file    Forced sexual  activity: Not on file  Other Topics Concern  . Not on file  Social History Narrative  . Not on file     BP 126/84   Pulse 92   Ht 5\' 7"  (1.702 m)   Wt 192 lb 6.4 oz (87.3 kg)   BMI 30.13 kg/m   Physical Exam:  Well appearing middle aged woman, NAD HEENT: Unremarkable Neck:  No JVD, no thyromegally Lymphatics:  No adenopathy Back:  No CVA tenderness Lungs:  Clear with no wheezes HEART:  Regular rate rhythm, no murmurs, no rubs, no clicks Abd:  soft, positive bowel sounds, no organomegally, no rebound, no guarding Ext:  2 plus pulses, no edema, no cyanosis, no clubbing Skin:  No rashes no nodules Neuro:  CN II through XII intact, motor grossly intact  EKG NSR with LVH  DEVICE  Normal device function.  See PaceArt for details.   Assess/Plan: 1. ICD - her boston Sci single chamber ICD is working normally. Will recheck in several months. 2. Chronic systolic heart - she is still having some problems with volume overload and notes occaisional edema.  3. Obesity - I have encouraged her to lose weight.  Gregg Taylor,M.D.  Mikle Bosworth.D.

## 2018-05-20 NOTE — Patient Instructions (Signed)
Routine lab work today. Will notify you of abnormal results, otherwise no news is good news!  No changes to medication at this time.  Follow up 3 months with Dr. Haroldine Laws.  ____________________________________________________________ Christine Mcdowell Code: 1500  Take all medication as prescribed the day of your appointment. Bring all medications with you to your appointment.  Do the following things EVERYDAY: 1) Weigh yourself in the morning before breakfast. Write it down and keep it in a log. 2) Take your medicines as prescribed 3) Eat low salt foods-Limit salt (sodium) to 2000 mg per day.  4) Stay as active as you can everyday 5) Limit all fluids for the day to less than 2 liters

## 2018-05-20 NOTE — Addendum Note (Signed)
Encounter addended by: Shirley Friar, PA-C on: 05/20/2018 4:06 PM  Actions taken: Sign clinical note, Result note filed

## 2018-05-20 NOTE — Patient Instructions (Signed)
Medication Instructions:  Your physician recommends that you continue on your current medications as directed. Please refer to the Current Medication list given to you today.  Labwork: None ordered.  Testing/Procedures: None ordered.  Follow-Up: Your physician wants you to follow-up in: 9 months with Dr. Lovena Le.   You will receive a reminder letter in the mail two months in advance. If you don't receive a letter, please call our office to schedule the follow-up appointment.  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 This monitoring reduces the number of office visits required to check your device to one time per year. It allows [place] to keep an eye on the functioning of your device to ensure it is working properly. You are scheduled for a device check from home on 08/19/2018. You may send your transmission at any time that day. If you have a wireless device, the transmission will be sent automatically. After your physician reviews your transmission, you will receive a postcard with your next transmission date.  Any Other Special Instructions Will Be Listed Below (If Applicable).  If you need a refill on your cardiac medications before your next appointment, please call your pharmacy.

## 2018-05-22 ENCOUNTER — Telehealth (HOSPITAL_COMMUNITY): Payer: Self-pay | Admitting: Cardiology

## 2018-05-22 MED ORDER — MAGNESIUM OXIDE -MG SUPPLEMENT 200 MG PO TABS: 400.0000 mg | ORAL_TABLET | Freq: Two times a day (BID) | ORAL | 11 refills | Status: DC

## 2018-05-22 NOTE — Telephone Encounter (Signed)
Notes recorded by Kerry Dory, CMA on 05/22/2018 at 10:16 AM EDT Patient aware. Patient voiced understanding   ------  Notes recorded by Kerry Dory, CMA on 05/21/2018 at 10:46 AM EDT Left message for patient to call back. [phone] (M)  ------  Notes recorded by Shirley Friar, PA-C on 05/20/2018 at 4:06 PM EDT Needs Mg Ox 400 mg BID.    Legrand Como [address]" [place], PA-C 05/20/2018 4:03 PM

## 2018-05-22 NOTE — Telephone Encounter (Signed)
-----   Message from Shirley Friar, PA-C sent at 05/20/2018  4:06 PM EDT ----- Needs Mg Ox 400 mg BID.    Legrand Como [address] [place], PA-C 05/20/2018 4:03 PM

## 2018-05-26 ENCOUNTER — Encounter (HOSPITAL_COMMUNITY): Payer: Self-pay | Admitting: Internal Medicine

## 2018-05-29 ENCOUNTER — Encounter: Payer: Self-pay | Admitting: Internal Medicine

## 2018-05-29 MED ORDER — PANTOPRAZOLE SODIUM 40 MG PO TBEC: 40.0000 mg | DELAYED_RELEASE_TABLET | Freq: Every day | ORAL | 0 refills | Status: DC

## 2018-06-03 ENCOUNTER — Ambulatory Visit: Payer: BLUE CROSS/BLUE SHIELD | Admitting: Cardiovascular Disease

## 2018-06-03 DIAGNOSIS — I5022 Chronic systolic (congestive) heart failure: Secondary | ICD-10-CM | POA: Diagnosis not present

## 2018-06-03 DIAGNOSIS — I110 Hypertensive heart disease with heart failure: Secondary | ICD-10-CM | POA: Diagnosis not present

## 2018-06-03 DIAGNOSIS — M329 Systemic lupus erythematosus, unspecified: Secondary | ICD-10-CM | POA: Diagnosis not present

## 2018-06-03 DIAGNOSIS — E119 Type 2 diabetes mellitus without complications: Secondary | ICD-10-CM | POA: Diagnosis not present

## 2018-06-09 DIAGNOSIS — Z79899 Other long term (current) drug therapy: Secondary | ICD-10-CM | POA: Diagnosis not present

## 2018-06-09 DIAGNOSIS — J309 Allergic rhinitis, unspecified: Secondary | ICD-10-CM | POA: Diagnosis not present

## 2018-06-09 DIAGNOSIS — Z8673 Personal history of transient ischemic attack (TIA), and cerebral infarction without residual deficits: Secondary | ICD-10-CM | POA: Diagnosis not present

## 2018-06-09 DIAGNOSIS — Z794 Long term (current) use of insulin: Secondary | ICD-10-CM | POA: Diagnosis not present

## 2018-06-09 DIAGNOSIS — E119 Type 2 diabetes mellitus without complications: Secondary | ICD-10-CM | POA: Diagnosis not present

## 2018-06-09 DIAGNOSIS — I110 Hypertensive heart disease with heart failure: Secondary | ICD-10-CM | POA: Diagnosis not present

## 2018-06-09 DIAGNOSIS — I509 Heart failure, unspecified: Secondary | ICD-10-CM | POA: Diagnosis not present

## 2018-06-09 DIAGNOSIS — Z7982 Long term (current) use of aspirin: Secondary | ICD-10-CM | POA: Diagnosis not present

## 2018-06-09 DIAGNOSIS — R0982 Postnasal drip: Secondary | ICD-10-CM | POA: Diagnosis not present

## 2018-06-09 DIAGNOSIS — M797 Fibromyalgia: Secondary | ICD-10-CM | POA: Diagnosis not present

## 2018-06-09 DIAGNOSIS — M069 Rheumatoid arthritis, unspecified: Secondary | ICD-10-CM | POA: Diagnosis not present

## 2018-06-09 DIAGNOSIS — R0602 Shortness of breath: Secondary | ICD-10-CM | POA: Diagnosis not present

## 2018-06-09 DIAGNOSIS — J40 Bronchitis, not specified as acute or chronic: Secondary | ICD-10-CM | POA: Diagnosis not present

## 2018-06-09 DIAGNOSIS — R05 Cough: Secondary | ICD-10-CM | POA: Diagnosis not present

## 2018-06-12 LAB — CUP PACEART INCLINIC DEVICE CHECK
Brady Statistic RV Percent Paced: 0 %
Date Time Interrogation Session: 20190529040000
HighPow Impedance: 76 Ohm
Implantable Lead Implant Date: 20190206
Implantable Lead Location: 753860
Implantable Lead Model: 292
Implantable Lead Serial Number: 438194
Implantable Pulse Generator Implant Date: 20190206
Lead Channel Impedance Value: 627 Ohm
Lead Channel Pacing Threshold Amplitude: 0.4 V
Lead Channel Pacing Threshold Pulse Width: 0.4 ms
Lead Channel Sensing Intrinsic Amplitude: 25 mV
Lead Channel Setting Pacing Amplitude: 2.5 V
Lead Channel Setting Pacing Pulse Width: 0.4 ms
Lead Channel Setting Sensing Sensitivity: 0.5 mV
Pulse Gen Serial Number: 243572

## 2018-06-13 DIAGNOSIS — Z7952 Long term (current) use of systemic steroids: Secondary | ICD-10-CM | POA: Diagnosis not present

## 2018-06-13 DIAGNOSIS — I110 Hypertensive heart disease with heart failure: Secondary | ICD-10-CM | POA: Diagnosis not present

## 2018-06-13 DIAGNOSIS — R05 Cough: Secondary | ICD-10-CM | POA: Diagnosis not present

## 2018-06-13 DIAGNOSIS — E119 Type 2 diabetes mellitus without complications: Secondary | ICD-10-CM | POA: Diagnosis not present

## 2018-06-13 DIAGNOSIS — E876 Hypokalemia: Secondary | ICD-10-CM | POA: Diagnosis not present

## 2018-06-13 DIAGNOSIS — Z8673 Personal history of transient ischemic attack (TIA), and cerebral infarction without residual deficits: Secondary | ICD-10-CM | POA: Diagnosis not present

## 2018-06-13 DIAGNOSIS — Z794 Long term (current) use of insulin: Secondary | ICD-10-CM | POA: Diagnosis not present

## 2018-06-13 DIAGNOSIS — M797 Fibromyalgia: Secondary | ICD-10-CM | POA: Diagnosis not present

## 2018-06-13 DIAGNOSIS — Z79899 Other long term (current) drug therapy: Secondary | ICD-10-CM | POA: Diagnosis not present

## 2018-06-13 DIAGNOSIS — J40 Bronchitis, not specified as acute or chronic: Secondary | ICD-10-CM | POA: Diagnosis not present

## 2018-06-13 DIAGNOSIS — M069 Rheumatoid arthritis, unspecified: Secondary | ICD-10-CM | POA: Diagnosis not present

## 2018-06-13 DIAGNOSIS — Z7982 Long term (current) use of aspirin: Secondary | ICD-10-CM | POA: Diagnosis not present

## 2018-06-13 DIAGNOSIS — H6692 Otitis media, unspecified, left ear: Secondary | ICD-10-CM | POA: Diagnosis not present

## 2018-06-13 DIAGNOSIS — I509 Heart failure, unspecified: Secondary | ICD-10-CM | POA: Diagnosis not present

## 2018-06-13 DIAGNOSIS — H9202 Otalgia, left ear: Secondary | ICD-10-CM | POA: Diagnosis not present

## 2018-06-15 DIAGNOSIS — E109 Type 1 diabetes mellitus without complications: Secondary | ICD-10-CM | POA: Diagnosis not present

## 2018-06-15 DIAGNOSIS — Z794 Long term (current) use of insulin: Secondary | ICD-10-CM | POA: Diagnosis not present

## 2018-06-16 ENCOUNTER — Encounter (HOSPITAL_COMMUNITY): Payer: Self-pay | Admitting: Internal Medicine

## 2018-06-17 ENCOUNTER — Telehealth (HOSPITAL_COMMUNITY): Payer: Self-pay | Admitting: *Deleted

## 2018-06-17 NOTE — Telephone Encounter (Signed)
I called patient regarding her Mychart message she sent found below.  I called to discuss more about her HR.  She stated her HR is normally above a 100 and she thought that was normal.  She said her HR yesterday and today was in the 80's.  He explained to her that is a normal HR and all the HR's documented in her chart shows she ranges in the 80-90's.  She also stated she has been fighting a sinus infection and on antibiotics from that.  That may be the reason she is feeling tired/weak.  I told her from a cardiac standpoint her vitals are normal. No further questions.   Message   ----- Message from [place], Generic sent at 06/16/2018 2:48 PM EDT -----    Great afternoon,  I am experiencing feeling weak, and my heart rate has slowed down to below 100. It's as if I am weak and  Sleepy and nothing can prevent it. What shall u do?

## 2018-06-19 ENCOUNTER — Encounter (HOSPITAL_COMMUNITY): Payer: Self-pay | Admitting: Internal Medicine

## 2018-06-22 ENCOUNTER — Ambulatory Visit (INDEPENDENT_AMBULATORY_CARE_PROVIDER_SITE_OTHER): Payer: BLUE CROSS/BLUE SHIELD

## 2018-06-22 ENCOUNTER — Other Ambulatory Visit (HOSPITAL_COMMUNITY): Payer: Self-pay | Admitting: *Deleted

## 2018-06-22 DIAGNOSIS — I5022 Chronic systolic (congestive) heart failure: Secondary | ICD-10-CM

## 2018-06-22 DIAGNOSIS — Z95810 Presence of automatic (implantable) cardiac defibrillator: Secondary | ICD-10-CM

## 2018-06-22 MED ORDER — SPIRONOLACTONE 25 MG PO TABS: 25.0000 mg | ORAL_TABLET | Freq: Two times a day (BID) | ORAL | 6 refills | Status: DC

## 2018-06-22 NOTE — Progress Notes (Signed)
EPIC Encounter for ICM Monitoring  Patient Name: Christine Mcdowell is a 47 y.o. female Date: 06/22/2018 Primary Care Physican: [name], MD Primary Cardiologist:Bensimhon Electrophysiologist:Taylor Dry Weight:195 lbs        Heart Failure questions reviewed, pt asymptomatic.   06/21/2018 Heartlogic HF Index: 0 suggesting normal.    Prescribed dosage: Torsemide20 mg take 3 tablets (60 mg total) twice a day.  Metolazone 2.5 mg takeone tablet as needed for weight gain of 5 lbs within 3 days.  Labs: 05/20/2018 Creatinine 1.03, BUN 14, Potassium 3.9, Sodium 140, EGFR >60 01/31/2018 Creatinine 0.88, BUN 13, Potassium 3.7, Sodium 137, EGFR 77-89  Recommendations: No changes.   Encouraged to call for fluid symptoms.  Follow-up plan: ICM clinic phone appointment on 07/23/2018.  Office appointment scheduled 08/18/2018 with Dr. Haroldine Laws.        Copy of ICM check sent to Dr. Lovena Le.   [name], RN 06/22/2018 8:03 AM

## 2018-06-25 ENCOUNTER — Other Ambulatory Visit: Payer: Self-pay | Admitting: Internal Medicine

## 2018-06-29 ENCOUNTER — Encounter (HOSPITAL_COMMUNITY): Payer: Self-pay | Admitting: Internal Medicine

## 2018-06-29 ENCOUNTER — Encounter: Payer: Self-pay | Admitting: Internal Medicine

## 2018-06-29 NOTE — Telephone Encounter (Signed)
Patient has not been seen in the office since her consult in Jan 2019 Pt emailed the office on 6.7.19 for refills on her pantoprazole - pt was informed that refill will be sent but that she needs an appt to further refills There are no pending appts on file for patient E-mail sent to patient

## 2018-07-08 DIAGNOSIS — M797 Fibromyalgia: Secondary | ICD-10-CM | POA: Diagnosis not present

## 2018-07-08 DIAGNOSIS — E109 Type 1 diabetes mellitus without complications: Secondary | ICD-10-CM | POA: Diagnosis not present

## 2018-07-08 DIAGNOSIS — M7989 Other specified soft tissue disorders: Secondary | ICD-10-CM | POA: Diagnosis not present

## 2018-07-08 DIAGNOSIS — R0602 Shortness of breath: Secondary | ICD-10-CM | POA: Diagnosis not present

## 2018-07-08 DIAGNOSIS — R05 Cough: Secondary | ICD-10-CM | POA: Diagnosis not present

## 2018-07-08 DIAGNOSIS — Z794 Long term (current) use of insulin: Secondary | ICD-10-CM | POA: Diagnosis not present

## 2018-07-08 DIAGNOSIS — R229 Localized swelling, mass and lump, unspecified: Secondary | ICD-10-CM | POA: Diagnosis not present

## 2018-07-08 DIAGNOSIS — Z7982 Long term (current) use of aspirin: Secondary | ICD-10-CM | POA: Diagnosis not present

## 2018-07-09 ENCOUNTER — Telehealth: Payer: Self-pay

## 2018-07-09 ENCOUNTER — Other Ambulatory Visit: Payer: Self-pay | Admitting: Nurse Practitioner

## 2018-07-09 ENCOUNTER — Ambulatory Visit
Admission: RE | Admit: 2018-07-09 | Discharge: 2018-07-09 | Disposition: A | Discharge status: Home / Self Care | Payer: BLUE CROSS/BLUE SHIELD | Source: Ambulatory Visit | Attending: Nurse Practitioner | Admitting: Nurse Practitioner

## 2018-07-09 DIAGNOSIS — S99922A Unspecified injury of left foot, initial encounter: Secondary | ICD-10-CM | POA: Diagnosis not present

## 2018-07-09 DIAGNOSIS — Z794 Long term (current) use of insulin: Secondary | ICD-10-CM | POA: Diagnosis not present

## 2018-07-09 DIAGNOSIS — E109 Type 1 diabetes mellitus without complications: Secondary | ICD-10-CM | POA: Diagnosis not present

## 2018-07-09 DIAGNOSIS — T148XXA Other injury of unspecified body region, initial encounter: Secondary | ICD-10-CM

## 2018-07-09 DIAGNOSIS — M79672 Pain in left foot: Secondary | ICD-10-CM | POA: Diagnosis not present

## 2018-07-09 DIAGNOSIS — E1165 Type 2 diabetes mellitus with hyperglycemia: Secondary | ICD-10-CM | POA: Diagnosis not present

## 2018-07-09 DIAGNOSIS — M7989 Other specified soft tissue disorders: Secondary | ICD-10-CM | POA: Diagnosis not present

## 2018-07-09 DIAGNOSIS — J22 Unspecified acute lower respiratory infection: Secondary | ICD-10-CM | POA: Diagnosis not present

## 2018-07-09 DIAGNOSIS — J209 Acute bronchitis, unspecified: Secondary | ICD-10-CM | POA: Diagnosis not present

## 2018-07-09 NOTE — Telephone Encounter (Signed)
Call back to patient as requested by voice mail message. In voice mail message she reported she went to ER yesterday and was told she is retaining 6-12 lbs of fluid and SOB due to a respiratory infection.  ER said there was a small amount of fluid in the lungs.  She is going to PCP today for antibiotics.  Patient was currently in PCP office at time of call and will call her back.  Reviewed 07/06/2018 Latitude remote transmission and Heartlogic Index has increased from 0 on 06/28/2018 to Index of 7 on 07/06/2018 which still remains below normal threshold.

## 2018-07-09 NOTE — Telephone Encounter (Signed)
Call back to patient.  She had ER yesterday for ShOB and leg swelling 07/08/18 and f/u PCP visit today.  Dx with bronchitis and ER told her there was a small amount of lung fluid.  ER advised her she has 6-10 lbs of fluid accumulation.  She reported PCP is concerned about bilateral leg swelling with one leg being warm to touch.  She reports weight gain between 6-10 lbs in last week and today's weight is 198 lbs.  At last ICM call on 7/1 she reported 195 lbs so unsure accuracy of how much weight she has gained.  7/15 Latitude transmission showed increase from 0 to 7 within the last 7 days which is below threshold.  Advised her send a remote transmission this evening for review tomorrow morning.    She has taken Torsemide as precsribed daily and taking PRN Metolazone 2.5 mg daily and for past 6 days with symptoms worsening.  Advised would forward to HF clinic for recommendation.

## 2018-07-10 ENCOUNTER — Encounter (HOSPITAL_COMMUNITY): Payer: Self-pay | Admitting: Internal Medicine

## 2018-07-10 NOTE — Telephone Encounter (Signed)
Spoke w/pt and sch appt for Mon 7/22 at 10:30 w/APP.  Advised she should not keep taking the Metolazone on a daily basis.  She states her SOB is improved but still has a lot of LE edema.  We discussed keeping legs elevated and watching salt and fluid intake she is agreeable.

## 2018-07-10 NOTE — Telephone Encounter (Signed)
Advised patient that I have not received any recommendations at this time and she may contact HF clinic directly if needed or use local ER if symptoms get worse.

## 2018-07-13 ENCOUNTER — Encounter (HOSPITAL_COMMUNITY): Payer: BLUE CROSS/BLUE SHIELD

## 2018-07-21 ENCOUNTER — Encounter (HOSPITAL_COMMUNITY): Payer: Self-pay | Admitting: Internal Medicine

## 2018-07-23 ENCOUNTER — Ambulatory Visit (INDEPENDENT_AMBULATORY_CARE_PROVIDER_SITE_OTHER): Payer: BLUE CROSS/BLUE SHIELD

## 2018-07-23 DIAGNOSIS — I5022 Chronic systolic (congestive) heart failure: Secondary | ICD-10-CM | POA: Diagnosis not present

## 2018-07-23 DIAGNOSIS — Z95810 Presence of automatic (implantable) cardiac defibrillator: Secondary | ICD-10-CM | POA: Diagnosis not present

## 2018-07-24 NOTE — Progress Notes (Signed)
EPIC Encounter for ICM Monitoring  Patient Name: Christine Mcdowell is a 47 y.o. female Date: 07/24/2018 Primary Care Physican: [name], MD Primary Cardiologist:Bensimhon Electrophysiologist:Taylor Dry Weight: 196.5 lbs      Heart Failure questions reviewed, pt reported swelling of her feet had resolved but today started swelling again and she is short of breath.    HeartLogic Heart Failure Index: 5.  Index has not crossed the threshold suggesting it is normal.     Prescribed dosage: Torsemide20 mg take3tablets (60 mg total) twice a day.Metolazone 2.5 mg takeone tablet as needed for weight gain of 5 lbs within 3 days.  Labs: 05/20/2018 Creatinine 1.03, BUN 14, Potassium 3.9, Sodium 140, EGFR >60 01/31/2018 Creatinine0.88, BUN13, Potassium3.7, [id number], [id number]  Recommendations:  Advised patient that she has Metolazone to take PRN but she should not take it everyday.  If her symptoms worsen over the weekend to take only 1 Metolazone 30 minute prior to taking Torsemide.  Advised to call the office if symptoms worsen before 8/8.  Follow-up plan: ICM clinic phone appointment on 07/30/2018 to recheck fluid levels.   Office appointment scheduled 08/18/2018 with Dr. Haroldine Laws.    Copy of ICM check sent to Dr. Lovena Le.     3 month ICM trend: 07/20/2018     [name], RN 07/24/2018 11:34 AM

## 2018-07-27 ENCOUNTER — Encounter: Payer: Self-pay | Admitting: Internal Medicine

## 2018-07-30 ENCOUNTER — Ambulatory Visit (INDEPENDENT_AMBULATORY_CARE_PROVIDER_SITE_OTHER): Payer: Self-pay

## 2018-07-30 DIAGNOSIS — Z95810 Presence of automatic (implantable) cardiac defibrillator: Secondary | ICD-10-CM

## 2018-07-30 DIAGNOSIS — I5022 Chronic systolic (congestive) heart failure: Secondary | ICD-10-CM

## 2018-07-30 NOTE — Progress Notes (Signed)
EPIC Encounter for ICM Monitoring  Patient Name: Christine Mcdowell is a 47 y.o. female Date: 07/30/2018 Primary Care Physican: [name], MD Primary Cardiologist:Bensimhon Electrophysiologist:Taylor Dry Weight: 192 lbs        Heart Failure questions reviewed, pt's symptoms of feet swelling have improved and weight decreased from 199 to 192 lbs after PCP increased Torsemide dosage x 3 days and she has taken Metolazone 2 x in past week. She still has slight swelling of feet and extreme tiredness.  She is concerned the report does not correlate with symptoms. She will discuss with Dr Haroldine Laws at appointment on 8/27.   HeartLogic Heart Failure Index: 5.  Index has not crossed the threshold suggesting it is normal.     Prescribed dosage: Torsemide20 mg take3tablets (60 mg total) twice a day.Metolazone 2.5 mg takeone tablet as needed for weight gain of 5 lbs within 3 days.  Labs: 05/20/2018 Creatinine 1.03, BUN 14, Potassium 3.9, Sodium 140, EGFR >60 01/31/2018 Creatinine0.88, BUN13, Potassium3.7, [id number], [id number]  Recommendations: No changes.   Encouraged to call for fluid symptoms.  She has been referred to a pulmonologist.   Follow-up plan: ICM clinic phone appointment on 08/10/2018 to see how she is feeling.  Office appointment scheduled 08/18/2018 with Dr. Haroldine Laws.        Copy of ICM check sent to Dr. Lovena Le and Dr Haroldine Laws for review and if any recommendations will call back.     3 Month Trend    [name], RN 07/30/2018 8:52 AM

## 2018-07-31 ENCOUNTER — Other Ambulatory Visit: Payer: Self-pay | Admitting: Internal Medicine

## 2018-08-03 DIAGNOSIS — Z0000 Encounter for general adult medical examination without abnormal findings: Secondary | ICD-10-CM | POA: Diagnosis not present

## 2018-08-03 DIAGNOSIS — M329 Systemic lupus erythematosus, unspecified: Secondary | ICD-10-CM | POA: Diagnosis not present

## 2018-08-03 DIAGNOSIS — I5022 Chronic systolic (congestive) heart failure: Secondary | ICD-10-CM | POA: Diagnosis not present

## 2018-08-03 DIAGNOSIS — I110 Hypertensive heart disease with heart failure: Secondary | ICD-10-CM | POA: Diagnosis not present

## 2018-08-03 DIAGNOSIS — E1165 Type 2 diabetes mellitus with hyperglycemia: Secondary | ICD-10-CM | POA: Diagnosis not present

## 2018-08-03 DIAGNOSIS — R21 Rash and other nonspecific skin eruption: Secondary | ICD-10-CM | POA: Diagnosis not present

## 2018-08-03 DIAGNOSIS — F329 Major depressive disorder, single episode, unspecified: Secondary | ICD-10-CM | POA: Diagnosis not present

## 2018-08-04 ENCOUNTER — Other Ambulatory Visit: Payer: Self-pay | Admitting: Nurse Practitioner

## 2018-08-04 DIAGNOSIS — Z1231 Encounter for screening mammogram for malignant neoplasm of breast: Secondary | ICD-10-CM

## 2018-08-05 ENCOUNTER — Ambulatory Visit (INDEPENDENT_AMBULATORY_CARE_PROVIDER_SITE_OTHER)
Admission: RE | Admit: 2018-08-05 | Discharge: 2018-08-05 | Disposition: A | Discharge status: Home / Self Care | Payer: BLUE CROSS/BLUE SHIELD | Source: Ambulatory Visit | Attending: Internal Medicine | Admitting: Internal Medicine

## 2018-08-05 ENCOUNTER — Ambulatory Visit (INDEPENDENT_AMBULATORY_CARE_PROVIDER_SITE_OTHER): Payer: BLUE CROSS/BLUE SHIELD | Admitting: Internal Medicine

## 2018-08-05 ENCOUNTER — Encounter: Payer: Self-pay | Admitting: Internal Medicine

## 2018-08-05 DIAGNOSIS — I10 Essential (primary) hypertension: Secondary | ICD-10-CM | POA: Diagnosis not present

## 2018-08-05 DIAGNOSIS — R058 Other specified cough: Secondary | ICD-10-CM

## 2018-08-05 DIAGNOSIS — R05 Cough: Secondary | ICD-10-CM | POA: Diagnosis not present

## 2018-08-05 DIAGNOSIS — R059 Cough, unspecified: Secondary | ICD-10-CM

## 2018-08-05 DIAGNOSIS — Z23 Encounter for immunization: Secondary | ICD-10-CM | POA: Diagnosis not present

## 2018-08-05 DIAGNOSIS — J3489 Other specified disorders of nose and nasal sinuses: Secondary | ICD-10-CM | POA: Diagnosis not present

## 2018-08-05 MED ORDER — HYDROCODONE-ACETAMINOPHEN 5-325 MG PO TABS: ORAL_TABLET | ORAL | 0 refills | Status: DC

## 2018-08-05 MED ORDER — VALSARTAN 160 MG PO TABS: 160.0000 mg | ORAL_TABLET | Freq: Two times a day (BID) | ORAL | 2 refills | Status: DC

## 2018-08-05 MED ORDER — PANTOPRAZOLE SODIUM 40 MG PO TBEC: DELAYED_RELEASE_TABLET | ORAL | 2 refills | Status: DC

## 2018-08-05 NOTE — Progress Notes (Signed)
Subjective:    Patient ID: Christine Mcdowell, female   DOB: 1971/03/27,    MRN: [id number]     Brief patient profile:  30 yobf never smoker with "as far back as she can remember"  sneezing/ itching/ runny eyes/ nose  Complicated sinus infection but not need for inhalers able to do HS sports=volleyball / not seen by specialist but required admitted to [place] "twice a year" starting around  Age 49-8 and this continued ever since except when living in  [place] late teens x 3 years > then flared again in [place] and eval by [employer] doctors not specialists rx prednisone worked the best  and then worse again on arrival back to [place] =  Age 61 and then dx lupus 2012 / feb 2015 dx was chf and on prednisone daily since 2012 per Dr Yves Dill at [facility] rheum and referred to pulmonary clinic 01/19/2018 by Dr Gae Bon PA for recurrent "pna"   History of Present Illness 01/19/2018 1st [facility] office visit/ [facility]   Chief Complaint  Patient presents with  . Pulmonary Consult    Referred by [name], NP. Pt states she has had PNA multiple times. She states she feels SOB "all the time" with or without exertion. She states she has year round allergies.  She has occ non prod cough and clears her throat often.   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 but condition did not improve Dry cough worsened Sept 2018 assoc with nasal and chest congestion  Legs more swollen than usual now  Unable to lie lower than 45 x feb 2015  Doe = MMRC3 = can't walk 100 yards even at a slow pace at a flat grade s stopping due to sob   Pain center of chest = xiphoid area s radiation with deep breathing x sev years  On and off acei since 01/2014 and on entresto since 06/2017 per mar review  rec Stop Breo Only use your albuterol as a rescue medication Pantoprazole (protonix) 40 mg   Take  30-60 min before first meal of the day  and Pepcid (famotidine)  20 mg one @  bedtime until return to office - this is the best way to tell whether stomach acid is contributing to your problem.   For drainage / throat tickle try take CHLORPHENIRAMINE  4 mg - take one every 4 hours as needed - available over the counter- may cause drowsiness so start with just a bedtime dose or two and see how you tolerate it before trying in daytime. Please remember to go to the lab department downstairs in the basement  for your tests - we will call you with the results when they are available.    Please schedule a follow up office visit in 4 weeks, sooner if needed with PFTs > did not happen       08/05/2018 acute extended ov/[facility] re: severe coughing fits x 2 months Chief Complaint  Patient presents with  . Acute Visit    Increased cough, SOB and wheezing x 2 months. She states she has been to ED x 2 since her symptoms started. Her cough is occ prod with light green sputum.  She is using her albuterol inhaler 3 x daily on average and neb 2 x daily. She is unable to use CPAP due to cough.    since last ov using neb  bid because of chest tightness / improves after saba but only for afew hours Not able to use cpap due to cough/ choking sensation and feels better off it Cough remains mostly dry with min discolored mucus in am and sense of nasal/ throat and chest congestion 24/7  Prednisone 20 mg daily = baseline per Yves Dill    No obvious day to day or daytime variability or assoc   mucus plugs or hemoptysis or cp or chest tightness,   or overt sinus or hb symptoms.   Sleeping at 45 degrees  without nocturnal  or early am exacerbation  of respiratory  c/o's or need for noct saba. Also denies any obvious fluctuation of symptoms with weather or environmental changes or other aggravating or alleviating factors except as outlined above   No unusual exposure hx or h/o childhood pna/ asthma or knowledge of premature birth.  Current Allergies, Complete Past  Medical History, Past Surgical History, Family History, and Social History were reviewed in [employer] record.  ROS  The following are not active complaints unless bolded Hoarseness, sore throat, dysphagia, dental problems, itching, sneezing,  nasal congestion or discharge of excess mucus or purulent secretions, ear ache,   fever, chills, sweats, unintended wt loss or wt gain, classically pleuritic or exertional cp,  orthopnea pnd or arm/hand swelling  or leg swelling, presyncope, palpitations, abdominal pain, anorexia, nausea, vomiting, diarrhea  or change in bowel habits or change in bladder habits, change in stools or change in urine, dysuria, hematuria,  rash, arthralgias, visual complaints, headache, numbness, weakness or ataxia or problems with walking or coordination,  change in mood or  memory.        Current Meds  Medication Sig  . acetaminophen (TYLENOL) 500 MG tablet Take 1,500 mg by mouth 2 (two) times daily as needed for mild pain or fever.   Marland Kitchen albuterol (ACCUNEB) 1.25 MG/3ML nebulizer solution Take 3 mLs (1.25 mg total) by nebulization every 6 (six) hours as needed for wheezing. (Patient taking differently: Take 1 ampule by nebulization 2 (two) times daily. )  . albuterol (PROVENTIL HFA;VENTOLIN HFA) 108 (90 Base) MCG/ACT inhaler Inhale 2 puffs into the lungs daily as needed for shortness of breath.  Marland Kitchen aspirin 81 MG chewable tablet Chew 162 mg by mouth every morning.   . carvedilol (COREG) 25 MG tablet TAKE 1 TABLET(25 MG) BY MOUTH TWICE DAILY WITH A MEAL  . clonazePAM (KLONOPIN) 0.5 MG tablet Take 1 tablet (0.5 mg total) by mouth 3 (three) times daily as needed (Anxiety).  . famotidine (PEPCID) 20 MG tablet One at bedtime (Patient taking differently: Take 20 mg by mouth at bedtime. One at bedtime)  . folic acid (FOLVITE) 1 MG tablet TAKE 1 TABLET BY MOUTH EVERY MORNING  . hydrALAZINE (APRESOLINE) 50 MG tablet Take 2 tablets (100 mg total) by mouth 3 (three)  times daily.  Marland Kitchen HYDROcodone-acetaminophen (NORCO/VICODIN) 5-325 MG tablet Up to one every 4 hours as needed for cough  . hydroxychloroquine (PLAQUENIL) 200 MG tablet Take 200 mg by mouth 2 (two) times daily.  . insulin aspart (NOVOLOG) 100 UNIT/ML injection Inject 5-10 Units into the skin See admin instructions. Only if blood sugar is above 150; on sliding scale  . Magnesium Oxide 200 MG TABS Take 2 tablets (400 mg total) by mouth 2 (two) times daily.  . meloxicam (MOBIC) 15 MG tablet Take 15 mg by mouth daily.   . metFORMIN (GLUCOPHAGE) 500 MG tablet Take 500 mg by mouth daily  with breakfast.  . methotrexate (RHEUMATREX) 2.5 MG tablet Take 20 mg by mouth once a week. On Friday  . metolazone (ZAROXOLYN) 2.5 MG tablet TAKE 1 TABLET BY MOUTH AS NEEDED FOR WEIGHT GAIN OF 5 POUNDS WITHIN 3 DAYS AS DIRECTED  . metroNIDAZOLE (METROGEL) 0.75 % vaginal gel USE VAGINALLY EVERY NIGHT FOR 5 NIGHTS  . montelukast (SINGULAIR) 10 MG tablet Take 10 mg by mouth daily.   . Multiple Vitamin (MULTIVITAMIN WITH MINERALS) TABS Take 1 tablet by mouth every morning.   . ondansetron (ZOFRAN) 4 MG tablet Take 1 tablet (4 mg total) by mouth every 8 (eight) hours as needed for nausea or vomiting.  . pantoprazole (PROTONIX) 40 MG tablet Take 30- 60 min before your first and last meals of the day  . predniSONE (DELTASONE) 20 MG tablet Take 20 mg by mouth daily with breakfast.   . Sarilumab (KEVZARA) 150 MG/1.14ML SOAJ Inject into the skin every 14 (fourteen) days.  Marland Kitchen spironolactone (ALDACTONE) 25 MG tablet Take 1 tablet (25 mg total) by mouth 2 (two) times daily.  . Tofacitinib Citrate (XELJANZ) 5 MG TABS Take 5 mg by mouth 2 (two) times daily.  Marland Kitchen torsemide (DEMADEX) 20 MG tablet Take 3 tablets (60 mg total) by mouth 2 (two) times daily.  . traZODone (DESYREL) 50 MG tablet Take 1 tablet by mouth at bedtime.  . TRESIBA FLEXTOUCH 100 UNIT/ML SOPN FlexTouch Pen Inject 10 Units into the skin every evening.   . [DISCONTINUED]  diphenhydrAMINE (BENADRYL) 25 MG tablet Take 75 mg by mouth 3 (three) times daily.   . [DISCONTINUED] HYDROcodone-acetaminophen (NORCO/VICODIN) 5-325 MG tablet Take 1 tablet by mouth every 6 (six) hours as needed for severe pain.  . [  pantoprazole (PROTONIX) 40 MG tablet TAKE 1 TABLET(40 MG) BY MOUTH DAILY 30 TO 60 MINUTES BEFORE FIRST MEAL OF THE DAY  .   sacubitril-valsartan (ENTRESTO) 97-103 MG Take 1 tablet by mouth 2 (two) times daily.                   Objective:   Physical Exam   Hoarse amb bf with very harsh upper airway coughing fits   08/05/2018       194   01/19/18 186 lb (84.4 kg)  12/15/17 180 lb 4 oz (81.8 kg)  11/18/17 180 lb (81.6 kg)    Vital signs reviewed - Note on arrival 02 sats  100% on RA and bp 152/92       HEENT: nl dentition, turbinates bilaterally, and oropharynx. Nl external ear canals without cough reflex   NECK :  without JVD/Nodes/TM/ nl carotid upstrokes bilaterally   LUNGS: no acc muscle use,  Nl contour chest which is clear to A and P bilaterally without cough on insp or exp maneuvers   CV:  RRR  no s3 or murmur or increase in P2, and trace ankle pitting  Edema bilaterally =    ABD:  soft and nontender with nl inspiratory excursion in the supine position. No bruits or organomegaly appreciated, bowel sounds nl  MS:  Nl gait/ ext warm without deformities, calf tenderness, cyanosis or clubbing No obvious joint restrictions   SKIN: warm and dry without lesions    NEURO:  alert, approp, nl sensorium with  no motor or cerebellar deficits apparent.         Chem profile reviewed 08/03/18   K= 4.2 and Creat 1.03     I personally reviewed images and agree with radiology impression as  follows:  08/05/2018  CT sinus > neg     Assessment:

## 2018-08-05 NOTE — Progress Notes (Signed)
LMTCB

## 2018-08-05 NOTE — Patient Instructions (Addendum)
Stop entresto and replace it with valsartan 160 mg twice daily  Change protonix to 40 mg Take 30- 60 min before your first and last meals of the day    GERD (REFLUX)  is an extremely common cause of respiratory symptoms just like yours , many times with no obvious heartburn at all.    It can be treated with medication, but also with lifestyle changes including elevation of the head of your bed (ideally with 6 inch  bed blocks),  Smoking cessation, avoidance of late meals, excessive alcohol, and avoid fatty foods, chocolate, peppermint, colas, red wine, and acidic juices such as orange juice.  NO MINT OR MENTHOL PRODUCTS SO NO COUGH DROPS   USE SUGARLESS CANDY INSTEAD (Jolley ranchers or Stover's or Life Savers) or even ice chips will also do - the key is to swallow to prevent all throat clearing. NO OIL BASED VITAMINS - use powdered substitutes.   For drainage / throat tickle try take CHLORPHENIRAMINE  4 mg - take one every 4 hours as needed - available over the counter- may cause drowsiness so start with just a bedtime dose or two and see how you tolerate it before trying in daytime  Take mucinex dm 1200 mg every 12 hours and supplement if needed with  vicodin up to 1 every 4 hours to suppress the urge to cough. Swallowing water and/or using ice chips/non mint and menthol containing candies (such as lifesavers or sugarless jolly ranchers) are also effective.  You should rest your voice and avoid activities that you know make you cough.  Once you have eliminated the cough for 3 straight days try reducing the vicodin first,  then the mucinex dm  as tolerated.      Please see patient coordinator before you leave today  to schedule sinus ct asap      Please schedule a follow up office visit in  1 week -  call sooner if needed with all medications /inhalers/ solutions in hand so we can verify exactly what you are taking. This includes all medications from all doctors and over the [place] separate them into two bags:  the ones you take automatically, no matter what, vs the ones you take just when you feel you need them "BAG #2 is UP TO YOU"  - this will really help [place] help you take your medications more effectively.

## 2018-08-06 ENCOUNTER — Encounter: Payer: Self-pay | Admitting: Internal Medicine

## 2018-08-06 ENCOUNTER — Telehealth: Payer: Self-pay | Admitting: Internal Medicine

## 2018-08-06 NOTE — Assessment & Plan Note (Signed)
In the best review of chronic cough to date ( NEJM 2016 375 [phone]) ,  ACEi are now felt to cause cough in up to  20% of pts which is a 4 fold increase from previous reports (and note vasotec was first reported to cause cough in 6% of pts whereas Entresto assoc with 9% cough in the initial trials)  and does not include the variety of non-specific complaints we see in pulmonary clinic in pts on ACEi but previously attributed to another dx like  Copd/asthma and  include PNDS, throat and chest congestion, "bronchitis", unexplained dyspnea and noct "strangling" sensations, and hoarseness, but also  atypical /refractory GERD symptoms like dysphagia and "bad heartburn"   The only way I know  to prove this is not an "ACEi Case" in an Entresto pt is  a minimum of 4 weeks while f/u can be arranged weekly here to be sure her bp is well controlled and no adverse effects on chf/ creat/K on Trial of  valsartan 160 mg bid for now     Discussed in detail all the  indications, usual  risks and alternatives  relative to the benefits with patient who agrees to proceed with w/u as outlined.       I had an extended discussion with the patient reviewing all relevant studies completed to date and  lasting 25 minutes of a 40  minute acute office visit addressing   severe non-specific but potentially very serious refractory respiratory symptoms of uncertain and potentially multiple  etiologies.   Each maintenance medication was reviewed in detail including most importantly the difference between maintenance and prns and under what circumstances the prns are to be triggered using an action plan format that is not reflected in the computer generated alphabetically organized AVS.    Please see AVS for specific instructions unique to this office visit that I personally wrote and verbalized to the the pt in detail and then reviewed with pt  by my nurse highlighting any changes in therapy/plan of care  recommended at today's  visit.

## 2018-08-06 NOTE — Progress Notes (Signed)
Spoke with pt and notified of results per Dr. Wert. Pt verbalized understanding and denied any questions. 

## 2018-08-06 NOTE — Telephone Encounter (Signed)
Call patient : Study is unremarkable, no change in recs > this points even stronger to a medication problem as we discussed which requires a trial off entresto and ok to use For drainage / throat tickle try take CHLORPHENIRAMINE 4 mg - take one every 4 hours as needed - available over the counter- may cause drowsiness so start with just a bedtime dose or two and see how you tolerate it before trying in daytime   Spoke with pt and notified of results per Dr. Melvyn Novas. Pt verbalized understanding and denied any questions.

## 2018-08-06 NOTE — Assessment & Plan Note (Signed)
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   These are the same pts (now being labeled as having "irritable larynx syndrome" by some cough centers) who not infrequently have a history of having failed to tolerate ace inhibitors - see hbp,  dry powder inhalers or biphosphonates or report having atypical/extraesophageal reflux symptoms that don't respond to standard doses of PPI  and are easily confused as having aecopd or asthma flares by even experienced allergists/ pulmonologists (myself included).   Of the three most common causes of  Sub-acute / recurrent or chronic cough, only one (GERD)  can actually contribute to/ trigger  the other two (asthma and post nasal drip syndrome)  and perpetuate the cylce of cough.  While not intuitively obvious, many patients with chronic low grade reflux do not cough until there is a primary insult that disturbs the protective epithelial barrier and exposes sensitive nerve endings.   This is typically viral but can due to PNDS and  either may apply here.   The point is that once this occurs, it is difficult to eliminate the cycle  using anything but a maximally effective acid suppression regimen at least in the short run, accompanied by  an appropriate diet to address non acid GERD and control / eliminate the cough itself for at least 3 days with vicodin when she can afford to be sleepy.   F/u will be weekly until cough is eliminated

## 2018-08-10 ENCOUNTER — Ambulatory Visit (INDEPENDENT_AMBULATORY_CARE_PROVIDER_SITE_OTHER): Payer: BLUE CROSS/BLUE SHIELD

## 2018-08-10 DIAGNOSIS — Z95810 Presence of automatic (implantable) cardiac defibrillator: Secondary | ICD-10-CM

## 2018-08-10 DIAGNOSIS — I5022 Chronic systolic (congestive) heart failure: Secondary | ICD-10-CM

## 2018-08-11 ENCOUNTER — Telehealth: Payer: Self-pay

## 2018-08-11 NOTE — Telephone Encounter (Signed)
Remote ICM transmission received.  Attempted call to patient and left message to return call. 

## 2018-08-11 NOTE — Progress Notes (Signed)
EPIC Encounter for ICM Monitoring  Patient Name: Christine Mcdowell is a 47 y.o. female Date: 08/11/2018 Primary Care Physican: Sanders, Robyn, MD Primary Cardiologist:Bensimhon Electrophysiologist:Taylor Dry Weight: Previous weight 192lbs         Attempted call to patient and unable to reach.  Left message to return call.  Transmission reviewed.    HeartLogic Heart Failure Index is 0.  Index has not crossed the threshold suggesting it is normal.  Index dropped from 5 on 8/8 to 0.   Prescribed dosage: Torsemide20 mg take3tablets (60 mg total) twice a day.Metolazone 2.5 mg takeone tablet as needed for weight gain of 5 lbs within 3 days.  Labs: 05/20/2018 Creatinine 1.03, BUN 14, Potassium 3.9, Sodium 140, EGFR >60 01/31/2018 Creatinine0.88, BUN13, Potassium3.7, Sodium137, EGFR77-89  Recommendations: NONE - Unable to reach.  Follow-up plan: ICM clinic phone appointment on 08/27/2018.  Office appointment scheduled 08/18/2018 with Dr. Bensimhon.        Copy of ICM check sent to Dr. Taylor.    3 Month Trend    8 Day Data Trend            Laurie S Short, RN 08/11/2018 8:32 AM    

## 2018-08-12 ENCOUNTER — Other Ambulatory Visit: Payer: Self-pay | Admitting: Internal Medicine

## 2018-08-12 MED ORDER — TELMISARTAN 80 MG PO TABS: 80.0000 mg | ORAL_TABLET | Freq: Every day | ORAL | 3 refills | Status: DC

## 2018-08-12 NOTE — Telephone Encounter (Signed)
Let's try her on micardis 80 mg one daily instead but she needs to get on it ASAP and let her know Dr Tempie Hoist agreed

## 2018-08-12 NOTE — Telephone Encounter (Signed)
Pt insurance will not cover Valsartan 160mg  BID. They will only cover for once daily.  MW - please advise. Thanks.

## 2018-08-13 DIAGNOSIS — M351 Other overlap syndromes: Secondary | ICD-10-CM | POA: Diagnosis not present

## 2018-08-13 DIAGNOSIS — M358 Other specified systemic involvement of connective tissue: Secondary | ICD-10-CM | POA: Diagnosis not present

## 2018-08-13 DIAGNOSIS — Z7952 Long term (current) use of systemic steroids: Secondary | ICD-10-CM | POA: Diagnosis not present

## 2018-08-13 DIAGNOSIS — Z79899 Other long term (current) drug therapy: Secondary | ICD-10-CM | POA: Diagnosis not present

## 2018-08-13 DIAGNOSIS — M059 Rheumatoid arthritis with rheumatoid factor, unspecified: Secondary | ICD-10-CM | POA: Diagnosis not present

## 2018-08-13 DIAGNOSIS — M329 Systemic lupus erythematosus, unspecified: Secondary | ICD-10-CM | POA: Diagnosis not present

## 2018-08-13 DIAGNOSIS — M0579 Rheumatoid arthritis with rheumatoid factor of multiple sites without organ or systems involvement: Secondary | ICD-10-CM | POA: Diagnosis not present

## 2018-08-14 DIAGNOSIS — E109 Type 1 diabetes mellitus without complications: Secondary | ICD-10-CM | POA: Diagnosis not present

## 2018-08-14 NOTE — Progress Notes (Addendum)
Spoke with patient.  Reviewed transmission. She c/o leg swelling, abdomen bloating and fullness, exhaustion and sleeping more than normal.  Her Lupus physician thinks prednisone by be having adverse side effects since she has been on it for many years.  He has changed her BP medication and working with Dr Haroldine Laws regarding any changes.  She has appointment with Dr Haroldine Laws next week.  Advised would repeat remote transmission on 9/5 and to check how she is feeling.  Will have DPR mailed to her for update on phone numbers.

## 2018-08-14 NOTE — Progress Notes (Signed)
Attempted return call to patient as requested by voice mail message.  

## 2018-08-17 ENCOUNTER — Other Ambulatory Visit (HOSPITAL_COMMUNITY): Payer: Self-pay | Admitting: Nurse Practitioner

## 2018-08-17 ENCOUNTER — Other Ambulatory Visit (HOSPITAL_COMMUNITY): Payer: Self-pay | Admitting: *Deleted

## 2018-08-17 DIAGNOSIS — M329 Systemic lupus erythematosus, unspecified: Secondary | ICD-10-CM

## 2018-08-17 DIAGNOSIS — I5022 Chronic systolic (congestive) heart failure: Secondary | ICD-10-CM

## 2018-08-18 ENCOUNTER — Ambulatory Visit (HOSPITAL_COMMUNITY)
Admission: RE | Admit: 2018-08-18 | Discharge: 2018-08-18 | Disposition: A | Discharge status: Home / Self Care | Payer: BLUE CROSS/BLUE SHIELD | Source: Ambulatory Visit | Attending: Internal Medicine | Admitting: Internal Medicine

## 2018-08-18 ENCOUNTER — Encounter (HOSPITAL_COMMUNITY): Payer: Self-pay | Admitting: Internal Medicine

## 2018-08-18 VITALS — BP 100/70 | HR 99 | Wt 192.0 lb

## 2018-08-18 DIAGNOSIS — I428 Other cardiomyopathies: Secondary | ICD-10-CM | POA: Insufficient documentation

## 2018-08-18 DIAGNOSIS — D688 Other specified coagulation defects: Secondary | ICD-10-CM | POA: Insufficient documentation

## 2018-08-18 DIAGNOSIS — G4733 Obstructive sleep apnea (adult) (pediatric): Secondary | ICD-10-CM | POA: Diagnosis not present

## 2018-08-18 DIAGNOSIS — I5022 Chronic systolic (congestive) heart failure: Secondary | ICD-10-CM | POA: Insufficient documentation

## 2018-08-18 DIAGNOSIS — G43909 Migraine, unspecified, not intractable, without status migrainosus: Secondary | ICD-10-CM | POA: Diagnosis not present

## 2018-08-18 DIAGNOSIS — I110 Hypertensive heart disease with heart failure: Secondary | ICD-10-CM | POA: Diagnosis not present

## 2018-08-18 DIAGNOSIS — Z7952 Long term (current) use of systemic steroids: Secondary | ICD-10-CM | POA: Insufficient documentation

## 2018-08-18 DIAGNOSIS — M329 Systemic lupus erythematosus, unspecified: Secondary | ICD-10-CM | POA: Insufficient documentation

## 2018-08-18 LAB — BASIC METABOLIC PANEL
Anion gap: 12 (ref 5–15)
BUN: 19 mg/dL (ref 6–20)
CO2: 26 mmol/L (ref 22–32)
Calcium: 9.1 mg/dL (ref 8.9–10.3)
Chloride: 104 mmol/L (ref 98–111)
Creatinine, Ser: 1.65 mg/dL — ABNORMAL HIGH (ref 0.44–1.00)
GFR calc Af Amer: 41 mL/min — ABNORMAL LOW (ref >60)
GFR calc non Af Amer: 36 mL/min — ABNORMAL LOW (ref >60)
Glucose, Bld: 116 mg/dL — ABNORMAL HIGH (ref 70–99)
Potassium: 3.1 mmol/L — ABNORMAL LOW (ref 3.5–5.1)
Sodium: 142 mmol/L (ref 135–145)

## 2018-08-18 LAB — BRAIN NATRIURETIC PEPTIDE: B Natriuretic Peptide: 16.9 pg/mL (ref 0.0–100.0)

## 2018-08-18 MED ORDER — METOLAZONE 2.5 MG PO TABS: ORAL_TABLET | ORAL | 3 refills | Status: DC

## 2018-08-18 MED ORDER — HYDROXYCHLOROQUINE SULFATE 200 MG PO TABS: 200.0000 mg | ORAL_TABLET | Freq: Two times a day (BID) | ORAL | 6 refills | Status: DC

## 2018-08-18 MED ORDER — TORSEMIDE 20 MG PO TABS: 60.0000 mg | ORAL_TABLET | Freq: Two times a day (BID) | ORAL | 6 refills | Status: DC

## 2018-08-18 MED ORDER — SPIRONOLACTONE 25 MG PO TABS: 25.0000 mg | ORAL_TABLET | Freq: Two times a day (BID) | ORAL | 6 refills | Status: DC

## 2018-08-18 MED ORDER — MAGNESIUM OXIDE -MG SUPPLEMENT 200 MG PO TABS: 400.0000 mg | ORAL_TABLET | Freq: Two times a day (BID) | ORAL | 6 refills | Status: DC

## 2018-08-18 MED ORDER — HYDRALAZINE HCL 50 MG PO TABS: 100.0000 mg | ORAL_TABLET | Freq: Three times a day (TID) | ORAL | 6 refills | Status: DC

## 2018-08-18 MED ORDER — CARVEDILOL 25 MG PO TABS: ORAL_TABLET | ORAL | 6 refills | Status: DC

## 2018-08-18 MED ORDER — SACUBITRIL-VALSARTAN 49-51 MG PO TABS: 1.0000 | ORAL_TABLET | Freq: Two times a day (BID) | ORAL | 3 refills | Status: DC

## 2018-08-18 NOTE — Patient Instructions (Signed)
Stop Micardis  Start Entresto 49/51 mg Twice daily   Labs today  Your physician has recommended that you have a cardiopulmonary stress test (CPX). CPX testing is a non-invasive measurement of heart and lung function. It replaces a traditional treadmill stress test. This type of test provides a tremendous amount of information that relates not only to your present condition but also for future outcomes. This test combines measurements of you ventilation, respiratory gas exchange in the lungs, electrocardiogram (EKG), blood pressure and physical response before, during, and following an exercise protocol.  Your physician recommends that you schedule a follow-up appointment in: 6-8 weeks

## 2018-08-18 NOTE — Progress Notes (Signed)
Advanced Heart Failure Clinic Note    Primary Cardiologist:  Dr Johnsie Cancel  Rheumatologist: Dr Eliberto Ivory  HF: Dr. Haroldine Laws   HPI: Christine Mcdowell is a 47 y.o. 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 S/P R ACL repair 08/2017.   Admitted in October 2017 with increased dyspnea and volume overload. Diuresed with IV lasix and transitioned to lasix 40 mg twice a day. LHC/RHC normal filling pressures, EF ~20%, and normal coronaries. Discharge weight 184 pounds.   Admitted to [facility] in September for ACL repair. Also received IV lasix due hospital admit.   She presents today for HF follow up. Last visit, ICD showed possible VT, but Dr Lovena Le and Dr Haroldine Laws reviewed and determined it was sinus tach vs atrial flutter. Saw Dr Melvyn Novas 07/2018 and Delene Loll was stopped due to cough. She is now on micardis 80 mg daily. Overall feeling poorly today. Cough has resolved with switch to micardis. She has been taking metolazone every 3-5 days. She is SOB with ADLs and minimal activity. Wearing CPAP qHS. Chronic orthopnea. Having intermittent BLE edema. Had some substernal CP while walking into clinic. Having dizziness at rest. Having palpitations that last about 2 minutes, occurs 2x/week. Also c/o fatigue. Poor appetite. Blood sugars have been running high and low over the last 3-4 weeks (has continuous glucose monitoring). This morning her sugar was so low that she had to drink large [employer] tea and chicken biscuit. Weights 192 to 198 lbs over the last week. Limits salt and fluid typically. Taking all medications.  Device interrogated: Heart Logic 0, 8 recorded runs of NSVT, but appears to be sinus tach on strips  Cardiac MRI in 02/2015 showed EF 46%. No LGE.  ECHO 11/2017 --->EF 25-30% RV ok.   CPX 10/2016 Peak VO2: 18.5 (82% predicted peak VO2) VE/VCO2  slope: 30 OUES: 1.75 Peak RER: 1.14.  RHC/LHC 10/11/2016  RA = 4 RV = 24/5 PA = 23/4 (16) PCW = 9 Fick cardiac output/index = 4.4/2.2 SVR = 1580 PVR =  1.5 WU Ao sat = 95% PA sat = 58%, 62% Assessment: 1. Normal coronary arteries with separate ostial for LAD and LCX 2. Normal right heart pressures 3. NICM with EF ~20% by echo  Review of systems complete and found to be negative unless listed in HPI.   SH:  Social History   Socioeconomic History  . Marital status: Divorced    Spouse name: Not on file  . Number of children: Not on file  . Years of education: Not on file  . Highest education level: Not on file  Occupational History  . Not on file  Social Needs  . Financial resource strain: Not on file  . Food insecurity:    Worry: Not on file    Inability: Not on file  . Transportation needs:    Medical: Not on file    Non-medical: Not on file  Tobacco Use  . Smoking status: Never Smoker  . Smokeless tobacco: Never Used  Substance and Sexual Activity  . Alcohol use: No  . Drug use: No  . Sexual activity: Not Currently    Birth control/protection: Surgical    Comment: hyst  Lifestyle  . Physical activity:    Days per week: Not on file    Minutes per session: Not on file  . Stress: Not on file  Relationships  . Social  connections:    Talks on phone: Not on file    Gets together: Not on file    Attends religious service: Not on file    Active member of club or organization: Not on file    Attends meetings of clubs or organizations: Not on file    Relationship status: Not on file  . Intimate partner violence:    Fear of current or ex partner: Not on file    Emotionally abused: Not on file    Physically abused: Not on file    Forced sexual activity: Not on file  Other Topics Concern  . Not on file  Social History Narrative  . Not on file   FH:  Family History  Problem Relation Age of Onset  . Arthritis Mother   . Heart murmur Mother   . Drug  abuse Mother   . Allergies Mother   . Heart attack Father   . Cushing syndrome Father   . Depression Father   . Allergies Father   . Dementia Paternal Grandmother   . Cancer Paternal Grandfather   . Diabetes Maternal Grandmother   . Hypertension Maternal Grandmother   . Asthma Maternal Grandmother   . Heart attack Maternal Grandfather    Past Medical History:  Diagnosis Date  . AICD (automatic cardioverter/defibrillator) present 01/28/2018  . Anemia   . Anginal pain ([place])   . Asthma   . Cervical cancer ([place])   . CHF (congestive heart failure) ([place])   . Diabetes mellitus without complication ([place])    steroid induced  . Discoid lupus   . Fibromyalgia   . History of blood transfusion "several"   "related to anemia; had some w/hysterectomy also"  . Hx of cardiovascular stress test    ETT-Myoview (9/15):  No ischemia, EF 52%; NORMAL  . Hx of echocardiogram    Echo (9/15):  EF 50-55%, ant HK, Gr 1 DD, mild MR, mild LAE, no effusion  . Hypertension   . Iron deficiency anemia    h/o iron transfusions  . Lupus (systemic lupus erythematosus) ([place])   . Migraine    "a few/year" (07/03/2016)  . Pneumonia 12/2015  . RA (rheumatoid arthritis) ([place])    "all over" (07/03/2016)  . Sickle cell trait ([place])   . Stroke ([place]) 2014 X 1; 2015 X 2; 2016 X 1;    "right side of face more relaxed than the other; rare speech hesitation" (07/03/2016)  . Vaginal Pap smear, abnormal    ASCUS; HPV    Current Outpatient Medications  Medication Sig Dispense Refill  . acetaminophen (TYLENOL) 500 MG tablet Take 1,500 mg by mouth 2 (two) times daily as needed for mild pain or fever.     Marland Kitchen albuterol (ACCUNEB) 1.25 MG/3ML nebulizer solution Take 3 mLs (1.25 mg total) by nebulization every 6 (six) hours as needed for wheezing. (Patient taking differently: Take 1 ampule by nebulization 2 (two) times daily. ) 75 mL 12  . albuterol (PROVENTIL HFA;VENTOLIN HFA) 108 (90 Base) MCG/ACT inhaler Inhale 2 puffs into the  lungs daily as needed for shortness of breath.    Marland Kitchen aspirin 81 MG chewable tablet Chew 162 mg by mouth every morning.     . carvedilol (COREG) 25 MG tablet TAKE 1 TABLET(25 MG) BY MOUTH TWICE DAILY WITH A MEAL 180 tablet 2  . clonazePAM (KLONOPIN) 0.5 MG tablet Take 1 tablet (0.5 mg total) by mouth 3 (three) times daily as needed (Anxiety). 45 tablet 0  . famotidine (  PEPCID) 20 MG tablet One at bedtime (Patient taking differently: Take 20 mg by mouth at bedtime. One at bedtime) 30 tablet 2  . folic acid (FOLVITE) 1 MG tablet TAKE 1 TABLET BY MOUTH EVERY MORNING 30 tablet 0  . hydrALAZINE (APRESOLINE) 50 MG tablet Take 2 tablets (100 mg total) by mouth 3 (three) times daily. 180 tablet 6  . HYDROcodone-acetaminophen (NORCO/VICODIN) 5-325 MG tablet Up to one every 4 hours as needed for cough 30 tablet 0  . hydroxychloroquine (PLAQUENIL) 200 MG tablet Take 200 mg by mouth 2 (two) times daily.    . insulin aspart (NOVOLOG) 100 UNIT/ML injection Inject 5-10 Units into the skin See admin instructions. Only if blood sugar is above 150; on sliding scale    . Magnesium Oxide 200 MG TABS Take 2 tablets (400 mg total) by mouth 2 (two) times daily. 120 tablet 11  . meloxicam (MOBIC) 15 MG tablet Take 15 mg by mouth daily.     . metFORMIN (GLUCOPHAGE) 500 MG tablet Take 500 mg by mouth daily with breakfast.    . methotrexate (RHEUMATREX) 2.5 MG tablet Take 20 mg by mouth once a week. On Friday  3  . metolazone (ZAROXOLYN) 2.5 MG tablet TAKE 1 TABLET BY MOUTH AS NEEDED FOR WEIGHT GAIN OF 5 POUNDS WITHIN 3 DAYS AS DIRECTED 5 tablet 3  . metroNIDAZOLE (METROGEL) 0.75 % vaginal gel USE VAGINALLY EVERY NIGHT FOR 5 NIGHTS 70 g 0  . montelukast (SINGULAIR) 10 MG tablet Take 10 mg by mouth daily.   3  . Multiple Vitamin (MULTIVITAMIN WITH MINERALS) TABS Take 1 tablet by mouth every morning.     . ondansetron (ZOFRAN) 4 MG tablet Take 1 tablet (4 mg total) by mouth every 8 (eight) hours as needed for nausea or vomiting.  20 tablet 0  . pantoprazole (PROTONIX) 40 MG tablet Take 30- 60 min before your first and last meals of the day 60 tablet 2  . predniSONE (DELTASONE) 20 MG tablet Take 20 mg by mouth daily with breakfast.     . Sarilumab (KEVZARA) 150 MG/1.14ML SOAJ Inject into the skin every 14 (fourteen) days.    Marland Kitchen spironolactone (ALDACTONE) 25 MG tablet Take 1 tablet (25 mg total) by mouth 2 (two) times daily. 60 tablet 6  . telmisartan (MICARDIS) 80 MG tablet Take 1 tablet (80 mg total) by mouth daily. 30 tablet 3  . Tofacitinib Citrate (XELJANZ) 5 MG TABS Take 5 mg by mouth 2 (two) times daily.    Marland Kitchen torsemide (DEMADEX) 20 MG tablet Take 3 tablets (60 mg total) by mouth 2 (two) times daily. 180 tablet 6  . traZODone (DESYREL) 50 MG tablet Take 1 tablet by mouth at bedtime.  5  . TRESIBA FLEXTOUCH 100 UNIT/ML SOPN FlexTouch Pen Inject 10 Units into the skin every evening.   0  . valsartan (DIOVAN) 160 MG tablet Take 1 tablet (160 mg total) by mouth 2 (two) times daily. 60 tablet 2   No current facility-administered medications for this encounter.    Vitals:   08/18/18 1415  BP: 118/82  Pulse: 99  SpO2: 98%  Weight: 87.1 kg (192 lb)     Wt Readings from Last 3 Encounters:  08/18/18 87.1 kg (192 lb)  08/05/18 88.3 kg (194 lb 9.6 oz)  05/20/18 87.1 kg (192 lb)   PHYSICAL EXAM:  General: Well appearing. No resp difficulty. HEENT: Normal Anicteric  Neck: Supple. JVP 5-6. Carotids 2+ bilat; no bruits. No thyromegaly or  nodule noted. Cor: PMI nondisplaced. Regular  No M/G/R noted Lungs: CTAB, normal effort. Abdomen: Soft, non-tender, non-distended, no HSM. No bruits or masses. +BS  Extremities: no cyanosis, clubbing, rash, tr edema Neuro: alert & oriented x 3, cranial nerves grossly intact. moves all 4 extremities w/o difficulty. Affect pleasant   ReDS vest: 38%  ASSESSMENT & PLAN: 1. Chronic Systolic Heart Failure - NICM Cath 10/11/16 with normal cors. CPX 10/2016: Peak VO2: 18.5 (82%  predicted peak VO2), VE/VCO2 slope: 30. ECHO 12/18 EF 25-30%  - S/P [employer] HeartLogic ICD 01/2018.  - Cardiac MRI in 02/2015 showed EF 46%. No LGE. CPX with very mild HF limitation and no high-risk features. - NYHA II-III symptoms - Volume status looks okay on exam. Heart Logic score is 0. ReDS vest shows volume up   - Continue torsemide 60 mg twice a day. - Continue metolazone as needed. Reinforced need for daily weights and reviewed use of sliding scale diuretics - Continue spiro 25 mg BID.  - Continue coreg 6.25 mg BID.  - Stop Micardis. Resume Entresto - Continue hydralazine 100 mg TID. Not on imdur due to headaches.  - She has been consented for cardiomems, but will plan to monitor her with HeartLogic device for now unless she has frequent hospitalizations for CHF - Reinforced fluid restriction to < 2 L daily, sodium restriction to less than 2000 mg daily, and the importance of daily weights.   2. Sinus tach vs atrial flutter on ICD - Follows with Dr Lovena Le 3.  Lupus  - On chronic prednisone.  - Per PCP.  4. HTN - Continue spiro 25 mg BID 5. OSA - Now using CPAP and awaiting "mask switch". 6. Migraines - Per PCP.  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 I personally reviewed laboratory data, imaging studies and relevant notes. I independently examined the patient and formulated the important aspects of the plan. I have edited the note to reflect any of my changes or salient points. I have personally discussed the plan with the patient and/or family.  Complains of NYHA IIIB symptoms with intermittent volume overload. Fluid management complicated but labile blood sugars. ICD interrogated and shows no VT/VF or other high-risk features with HL score of 0. Volume status up and down. Entresto recently stopped by Dr. Melvyn Novas due to cough.   ReDS vest 38% c/w mild volume overload. Will restart Entresto at 49/51 bid. Can take metolazone as needed. Check labs today including BNP. Repeat CPX to get a clearer picture of the situation. Previous CPX was relatively normal.   Glori Bickers, MD  2:56 PM

## 2018-08-19 ENCOUNTER — Encounter (HOSPITAL_COMMUNITY): Payer: Self-pay

## 2018-08-19 ENCOUNTER — Ambulatory Visit: Payer: BLUE CROSS/BLUE SHIELD | Admitting: Internal Medicine

## 2018-08-19 ENCOUNTER — Telehealth (HOSPITAL_COMMUNITY): Payer: Self-pay

## 2018-08-19 DIAGNOSIS — E876 Hypokalemia: Secondary | ICD-10-CM

## 2018-08-19 NOTE — Telephone Encounter (Signed)
-----   Message from Jolaine Artist, MD sent at 08/18/2018 11:41 PM EDT ----- She looks dry and K low. Suspect ReDS reading not good quality. Please have her take 60 extra kcl. Avoid metolazone this week. Make sure she is taking 40 kcl with each dose metolazone in future. Repeat BMET 1 week.

## 2018-08-19 NOTE — Telephone Encounter (Signed)
Called and informed patient of lab results and recommendations to take 60 mEq of potassium today. Patient has 10 mEq tablets, therefore she will take 6 tablets. Instructed patient to not take metolazone this week. Made patient aware that in the future if she takes metolazone she will need to take 40 mEq of potassium with it (4 tablets). Patient will come in for repeat BMET on 9/4. Patient verbalized understanding and thanked me for the call.

## 2018-08-19 NOTE — Progress Notes (Signed)
Disability medical record request faxed to [place] per request (104 total pages). Faxed to provided # [phone]  Renee Pain, RN

## 2018-08-25 ENCOUNTER — Ambulatory Visit: Payer: BLUE CROSS/BLUE SHIELD | Admitting: Dietician

## 2018-08-25 ENCOUNTER — Other Ambulatory Visit (HOSPITAL_COMMUNITY): Payer: Self-pay | Admitting: Internal Medicine

## 2018-08-26 ENCOUNTER — Other Ambulatory Visit (HOSPITAL_COMMUNITY): Payer: BLUE CROSS/BLUE SHIELD

## 2018-08-26 DIAGNOSIS — E119 Type 2 diabetes mellitus without complications: Secondary | ICD-10-CM | POA: Diagnosis not present

## 2018-08-26 DIAGNOSIS — Z7984 Long term (current) use of oral hypoglycemic drugs: Secondary | ICD-10-CM | POA: Diagnosis not present

## 2018-08-26 DIAGNOSIS — Z794 Long term (current) use of insulin: Secondary | ICD-10-CM | POA: Diagnosis not present

## 2018-08-26 DIAGNOSIS — H25043 Posterior subcapsular polar age-related cataract, bilateral: Secondary | ICD-10-CM | POA: Diagnosis not present

## 2018-08-26 LAB — HM DIABETES EYE EXAM

## 2018-08-27 ENCOUNTER — Ambulatory Visit (INDEPENDENT_AMBULATORY_CARE_PROVIDER_SITE_OTHER): Payer: BLUE CROSS/BLUE SHIELD | Admitting: *Deleted

## 2018-08-27 ENCOUNTER — Other Ambulatory Visit: Payer: Self-pay | Admitting: Student

## 2018-08-27 ENCOUNTER — Ambulatory Visit (INDEPENDENT_AMBULATORY_CARE_PROVIDER_SITE_OTHER): Payer: BLUE CROSS/BLUE SHIELD

## 2018-08-27 DIAGNOSIS — Z95810 Presence of automatic (implantable) cardiac defibrillator: Secondary | ICD-10-CM | POA: Diagnosis not present

## 2018-08-27 DIAGNOSIS — I5022 Chronic systolic (congestive) heart failure: Secondary | ICD-10-CM

## 2018-08-27 DIAGNOSIS — I420 Dilated cardiomyopathy: Secondary | ICD-10-CM | POA: Diagnosis not present

## 2018-08-27 NOTE — Progress Notes (Signed)
Remote ICD transmission.   

## 2018-08-28 ENCOUNTER — Other Ambulatory Visit: Payer: Self-pay | Admitting: Internal Medicine

## 2018-08-28 ENCOUNTER — Telehealth: Payer: Self-pay | Admitting: *Deleted

## 2018-08-28 ENCOUNTER — Ambulatory Visit (HOSPITAL_COMMUNITY): Payer: BLUE CROSS/BLUE SHIELD

## 2018-08-28 ENCOUNTER — Inpatient Hospital Stay (HOSPITAL_COMMUNITY): Admission: RE | Admit: 2018-08-28 | Payer: BLUE CROSS/BLUE SHIELD | Source: Ambulatory Visit

## 2018-08-28 NOTE — Telephone Encounter (Signed)
See 08/28/18 telephone note for details.sss

## 2018-08-28 NOTE — Progress Notes (Signed)
EPIC Encounter for ICM Monitoring  Patient Name: Christine Mcdowell is a 47 y.o. female Date: 08/28/2018 Primary Care Physican: [name], MD Primary Cardiologist:Bensimhon Electrophysiologist:Taylor Dry Weight:198lbs       Heart Failure questions reviewed, pt reported cough has stopped, congestion is better, leg swelling has gone down but she is still excessively tired and low appetite.  She will be scheduled for CPX and possibly heart cath.     HeartLogic Heart Failure Index is 3.  Index has not crossed the threshold suggesting normal.  Prescribed dosage: Torsemide20 mg take3tablets (60 mg total) twice a day.Metolazone 2.5 mg takeone tablet as needed for weight gain of 5 lbs within 3 days.  Potassium 10 mEq take 4 tablets (40 mEq total) as needed with Metolazone  Labs: 08/18/2018 Creatinine 1.65, BUN 19, Potassium 3.1, Sodium 142, EGFR 36-41 05/20/2018 Creatinine 1.03, BUN 14, Potassium 3.9, Sodium 140, EGFR >60 01/31/2018 Creatinine0.88, BUN13, Potassium3.7, [id number], [id number]  Recommendations: No changes.  Advised to limit salt intake to 2000 mg/day and fluid intake to < 2 liters/day.  Encouraged to call for fluid symptoms.  Follow-up plan: ICM clinic phone appointment on 09/28/2018.  Office appointment scheduled 10/09/2018 with Dr. Haroldine Laws.        Copy of ICM check sent to Dr. Lovena Le and Dr Haroldine Laws.    3 Month Trend    8 Day Data Trend            [name], RN 08/28/2018 7:39 AM

## 2018-08-28 NOTE — Telephone Encounter (Signed)
Called patient in regards to her ICD "dropping lower" since implant. Patient states that she noticed some discomfort in the area and then saw that it had moved down x ~1week ago. Patient denies any redness, drainage, or edema in the area. Patient c/o occasional discomfort, but states that it's nothing like it was a week ago. Patient denies sleeping on her L side, and she denies any injuries to the area. Patient states that the device has never flipped over.   Remote transmission from 08/27/18 reviewed. Stable lead measurements.  I told patient that some migration of the device post implant is normal. I told her to call back if she notices any other changes in the area, or if the device flips over. Patient verbalized understanding and appreciation of information.

## 2018-09-01 ENCOUNTER — Ambulatory Visit: Payer: BLUE CROSS/BLUE SHIELD

## 2018-09-02 ENCOUNTER — Ambulatory Visit (INDEPENDENT_AMBULATORY_CARE_PROVIDER_SITE_OTHER): Payer: BLUE CROSS/BLUE SHIELD | Admitting: Internal Medicine

## 2018-09-02 ENCOUNTER — Ambulatory Visit (INDEPENDENT_AMBULATORY_CARE_PROVIDER_SITE_OTHER)
Admission: RE | Admit: 2018-09-02 | Discharge: 2018-09-02 | Disposition: A | Discharge status: Home / Self Care | Payer: BLUE CROSS/BLUE SHIELD | Source: Ambulatory Visit | Attending: Internal Medicine | Admitting: Internal Medicine

## 2018-09-02 ENCOUNTER — Encounter: Payer: Self-pay | Admitting: Internal Medicine

## 2018-09-02 VITALS — BP 112/70 | HR 91 | Ht 66.0 in | Wt 193.2 lb

## 2018-09-02 DIAGNOSIS — R058 Other specified cough: Secondary | ICD-10-CM

## 2018-09-02 DIAGNOSIS — R918 Other nonspecific abnormal finding of lung field: Secondary | ICD-10-CM | POA: Diagnosis not present

## 2018-09-02 DIAGNOSIS — I10 Essential (primary) hypertension: Secondary | ICD-10-CM | POA: Diagnosis not present

## 2018-09-02 DIAGNOSIS — Z23 Encounter for immunization: Secondary | ICD-10-CM | POA: Diagnosis not present

## 2018-09-02 DIAGNOSIS — R05 Cough: Secondary | ICD-10-CM

## 2018-09-02 NOTE — Progress Notes (Signed)
Subjective:    Patient ID: Christine Mcdowell, female   DOB: September 03, 1971,    MRN: [id number]   Brief patient profile:  73 yobf never smoker with "as far back as she can remember"  sneezing/ itching/ runny eyes/ nose  Complicated sinus infection but not need for inhalers able to do HS sports=volleyball / not seen by specialist but required admitted to [facility] "twice a year" starting around  Age 49-8 and this continued ever since except when living in  [place] late teens x 3 years > then flared again in [place] and eval by [employer] doctors not specialists rx prednisone worked the best  and then worse again on arrival back to [place] =  Age 11 and then dx lupus 2012 / feb 2015 dx was chf and on prednisone daily since 2012 per Dr Yves Dill at [facility] rheum and referred to pulmonary clinic 01/19/2018 by Dr Gae Bon PA for recurrent "pna"     History of Present Illness 01/19/2018 1st [facility] office visit/ Christine Mcdowell   Chief Complaint  Patient presents with  . Pulmonary Consult    Referred by [name], NP. Pt states she has had PNA multiple times. She states she feels SOB "all the time" with or without exertion. She states she has year round allergies.  She has occ non prod cough and clears her throat often.   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   For drainage / throat tickle try take CHLORPHENIRAMINE  4 mg - take one every 4 hours as needed - available over the counter- may cause drowsiness so start with just a bedtime dose or two and see how you tolerate it before trying in daytime. Please remember to go to the lab department downstairs in the basement  for your tests - we will call you with the results when they are available.    Please schedule a follow up office visit in 4 weeks, sooner if needed with PFTs > did not happen       08/05/2018 acute extended ov/Christine Mcdowell re: severe coughing fits x 2 months Chief Complaint  Patient presents with  . Acute Visit    Increased cough, SOB and wheezing x 2 months. She states she has been to ED x 2 since her symptoms started. Her cough is occ prod with light green sputum.  She is using her albuterol inhaler 3 x daily on average and neb 2 x daily. She is unable to use CPAP due to cough.    since last ov using neb  bid because of chest tightness / improves after saba but only for afew hours Not able to use cpap due to cough/ choking sensation and feels better off it Cough remains mostly dry with min discolored mucus in am and sense of nasal/ throat and chest congestion 24/7  Prednisone 20 mg daily = baseline per Yves Dill  rec Stop entresto and replace it with valsartan 160 mg twice daily Change protonix to 40 mg Take 30- 60 min before your first and last meals of the day  GERD diet  For drainage / throat tickle try take CHLORPHENIRAMINE  4 mg - take one every 4 hours as needed -  Take mucinex dm 1200 mg every 12 hours and supplement if needed with  vicodin up to 1 every 4 hours   Please see patient coordinator before you leave today  to schedule sinus ct asap     09/02/2018  f/u ov/Christine Mcdowell re: uacs ? Related to pnds/ aggravated by entresto? But back  on it since 08/20/18  Chief Complaint  Patient presents with  . Follow-up    Her cough has resolved. She is c/o watery eyes. She states her breathing is unchanged.   Dyspnea:  MMRC3 = can't walk 100 yards even at a slow pace at a flat grade s stopping due to sob   Cough: none Sleeping: cpap 45 degree SABA use: few times a week  02: none   [facility] rx prednisone 17.5 mg /day/ did not start 1st gen H1 blockers per guidelines  Yet and has lots of eye itching/ sneezing    No obvious day to day or daytime variability or assoc excess/ purulent sputum or mucus plugs or hemoptysis or cp or chest tightness, subjective wheeze or overt sinus or hb symptoms.   Sleep as above  without nocturnal  or early am exacerbation  of respiratory  c/o's or need for noct saba. Also denies any obvious fluctuation of symptoms with weather or environmental changes or other aggravating or alleviating factors except as outlined above   No unusual exposure hx or h/o childhood pna/ asthma or knowledge of premature birth.  Current Allergies, Complete Past Medical History, Past Surgical History, Family History, and Social History were reviewed in [employer] record.  ROS  The following are not active complaints unless bolded Hoarseness, sore throat, dysphagia, dental problems, itching, sneezing,  nasal congestion or discharge of excess mucus or purulent secretions, ear ache,   fever, chills, sweats, unintended wt loss or wt gain, classically pleuritic or exertional cp,  orthopnea pnd or arm/hand swelling  or leg swelling, presyncope, palpitations, abdominal pain, anorexia, nausea, vomiting, diarrhea  or change in bowel habits or change in bladder habits, change in stools or change in urine, dysuria, hematuria,  rash, arthralgias, visual complaints, headache, numbness, weakness or ataxia or problems with walking or coordination,  change in mood or  memory.        Current Meds  Medication Sig  . albuterol  (ACCUNEB) 1.25 MG/3ML nebulizer solution Take 3 mLs (1.25 mg total) by nebulization every 6 (six) hours as needed for wheezing. (Patient taking differently: Take 1 ampule by nebulization 2 (two) times daily. )  . albuterol (PROVENTIL HFA;VENTOLIN HFA) 108 (90 Base) MCG/ACT inhaler Inhale 2 puffs into the lungs daily as needed for shortness of breath.  Marland Kitchen aspirin 81 MG chewable tablet Chew 162 mg by mouth every morning.   . carvedilol (COREG) 25 MG tablet TAKE 1 TABLET(25 MG) BY MOUTH TWICE DAILY  WITH A MEAL  . clonazePAM (KLONOPIN) 0.5 MG tablet Take 1 tablet (0.5 mg total) by mouth 3 (three) times daily as needed (Anxiety).  . famotidine (PEPCID) 20 MG tablet One at bedtime (Patient taking differently: Take 20 mg by mouth at bedtime. One at bedtime)  . folic acid (FOLVITE) 1 MG tablet TAKE 1 TABLET BY MOUTH EVERY MORNING  . hydrALAZINE (APRESOLINE) 50 MG tablet Take 2 tablets (100 mg total) by mouth 3 (three) times daily.  . hydroxychloroquine (PLAQUENIL) 200 MG tablet Take 1 tablet (200 mg total) by mouth 2 (two) times daily.  . insulin aspart (NOVOLOG) 100 UNIT/ML injection Inject 5-10 Units into the skin See admin instructions. Only if blood sugar is above 150; on sliding scale  . Magnesium Oxide 200 MG TABS Take 2 tablets (400 mg total) by mouth 2 (two) times daily.  . meloxicam (MOBIC) 15 MG tablet Take 15 mg by mouth daily.   . metFORMIN (GLUCOPHAGE) 500 MG tablet Take 500 mg by mouth daily with breakfast.  . methotrexate (RHEUMATREX) 2.5 MG tablet Take 20 mg by mouth once a week. On Friday  . metolazone (ZAROXOLYN) 2.5 MG tablet TAKE 1 TABLET BY MOUTH AS NEEDED FOR WEIGHT GAIN OF 5 POUNDS WITHIN 3 DAYS AS DIRECTED  . montelukast (SINGULAIR) 10 MG tablet Take 10 mg by mouth daily.   . Multiple Vitamin (MULTIVITAMIN WITH MINERALS) TABS Take 1 tablet by mouth every morning.   . Olopatadine HCl 0.2 % SOLN Apply 1 drop to eye daily.  . ondansetron (ZOFRAN) 4 MG tablet Take 1 tablet (4 mg total)  by mouth every 8 (eight) hours as needed for nausea or vomiting.  . pantoprazole (PROTONIX) 40 MG tablet TAKE 1 TABLET(40 MG) BY MOUTH DAILY 30 TO 60 MINUTES BEFORE FIRST MEAL OF THE DAY  . potassium chloride (K-DUR) 10 MEQ tablet Take 40 mEq by mouth daily as needed (with metolazone).  . predniSONE (DELTASONE) 5 MG tablet Take 17.5 mg by mouth daily with breakfast.  . sacubitril-valsartan (ENTRESTO) 49-51 MG Take 1 tablet by mouth 2 (two) times daily.  . Sarilumab (KEVZARA) 150 MG/1.14ML SOAJ Inject into the skin every 14 (fourteen) days.  Marland Kitchen spironolactone (ALDACTONE) 25 MG tablet Take 1 tablet (25 mg total) by mouth 2 (two) times daily.  . Tofacitinib Citrate 5 MG TABS Take 5 mg by mouth 2 (two) times daily.  Marland Kitchen torsemide (DEMADEX) 20 MG tablet Take 3 tablets (60 mg total) by mouth 2 (two) times daily.  . traZODone (DESYREL) 50 MG tablet Take 1 tablet by mouth at bedtime.  . TRESIBA FLEXTOUCH 100 UNIT/ML SOPN FlexTouch Pen Inject 10 Units into the skin every evening.                  Objective:   Physical Exam   Pleasant amb obese bf nad   09/02/2018       193 08/05/2018       194   01/19/18 186 lb (84.4 kg)  12/15/17 180 lb 4 oz (81.8 kg)  11/18/17 180 lb (81.6 kg)    Vital signs reviewed - Note on arrival 02 sats  99% on RA     HEENT: nl dentition, turbinates bilaterally, and oropharynx. Nl external ear canals without cough reflex   NECK :  without JVD/Nodes/TM/ nl carotid upstrokes bilaterally   LUNGS: no acc muscle use,  Nl contour chest which is clear to A and P bilaterally without cough on insp or exp maneuvers  CV:  RRR  no s3 or murmur or increase in P2, and trace bilateral ankle pitting edema  ABD:  soft and nontender with nl inspiratory excursion in the supine position. No bruits or organomegaly appreciated, bowel sounds nl  MS:  Nl gait/ ext warm without deformities, calf tenderness, cyanosis or clubbing No obvious joint restrictions   SKIN: warm and dry  without lesions    NEURO:  alert, approp, nl sensorium with  no motor or cerebellar deficits apparent.      CXR PA and Lateral:   09/02/2018 :    I personally reviewed images and agree with radiology impression as follows:   No active cardiopulmonary disease    Assessment:

## 2018-09-02 NOTE — Patient Instructions (Addendum)
For drainage / throat tickle try take CHLORPHENIRAMINE  4 mg - take one every 4 hours as needed - available over the counter- may cause drowsiness so start with just a bedtime dose or two and see how you tolerate it before trying in daytime     Please remember to go to the  x-ray department downstairs in the basement  for your tests - we will call you with the results when they are available.            If you are satisfied with your treatment plan,  let your doctor know and he/she can either refill your medications or you can return here when your prescription runs out.     If in any way you are not 100% satisfied,  please tell us.  If 100% better, tell your friends!  Pulmonary follow up is as needed

## 2018-09-03 ENCOUNTER — Ambulatory Visit (HOSPITAL_COMMUNITY): Payer: BLUE CROSS/BLUE SHIELD | Attending: Cardiology

## 2018-09-03 ENCOUNTER — Other Ambulatory Visit (HOSPITAL_COMMUNITY): Payer: Self-pay | Admitting: *Deleted

## 2018-09-03 DIAGNOSIS — I5022 Chronic systolic (congestive) heart failure: Secondary | ICD-10-CM

## 2018-09-03 NOTE — Progress Notes (Signed)
Spoke with pt and notified of results per Dr. Wert. Pt verbalized understanding and denied any questions. 

## 2018-09-05 ENCOUNTER — Encounter (HOSPITAL_COMMUNITY): Payer: Self-pay

## 2018-09-06 ENCOUNTER — Encounter: Payer: Self-pay | Admitting: Internal Medicine

## 2018-09-06 NOTE — Assessment & Plan Note (Signed)
Trial off entresto 08/05/2018 for refractory cough 08/05/2018 > resumed 08/18/18 s cough relapse as of 09/02/2018   Adequate control on present rx, reviewed in detail with pt > no change in rx needed

## 2018-09-06 NOTE — Assessment & Plan Note (Signed)
On and off acei since 01/2014 and on entresto since 06/2017 per mar review - Allergy profile 01/19/2018 >  Eos 0.0 /  IgE  2 RAST neg - Sinus CT 08/05/2018 neg  - 08/05/2018 rec trial off entresto > cough resolved >>  restarted by pt/[facility] 08/20/18 s flare as of 09/02/2018   Continues to have mild sensation of pnds > 1st gen H1 blockers per guidelines    May be that entresto lowers the threshold for cough from pnds but it is needed for her chf so try to keep on for now and pulmonary f/u can be prn    I had an extended discussion with the patient reviewing all relevant studies completed to date and  lasting 15 to 20 minutes of a 25 minute visit    Each maintenance medication was reviewed in detail including most importantly the difference between maintenance and prns and under what circumstances the prns are to be triggered using an action plan format that is not reflected in the computer generated alphabetically organized AVS.     Please see AVS for specific instructions unique to this visit that I personally wrote and verbalized to the the pt in detail and then reviewed with pt  by my nurse highlighting any  changes in therapy recommended at today's visit to their plan of care.

## 2018-09-06 NOTE — Assessment & Plan Note (Signed)
cxr clear as of 09/02/2018   No pulmonary f/u needed

## 2018-09-09 MED ORDER — SACUBITRIL-VALSARTAN 24-26 MG PO TABS: 1.0000 | ORAL_TABLET | Freq: Two times a day (BID) | ORAL | 3 refills | Status: DC

## 2018-09-09 NOTE — Telephone Encounter (Signed)
Per Dr.Bensimhon sbp goal is 100-120 so patients numbers are ok. If she can not tolerate entresto 49/51 bid we can decrease entresto to 24/26 bid. Spoke with patient she wants to decrease entresto. New script sent.

## 2018-09-10 DIAGNOSIS — E109 Type 1 diabetes mellitus without complications: Secondary | ICD-10-CM | POA: Diagnosis not present

## 2018-09-10 DIAGNOSIS — Z794 Long term (current) use of insulin: Secondary | ICD-10-CM | POA: Diagnosis not present

## 2018-09-14 DIAGNOSIS — H2512 Age-related nuclear cataract, left eye: Secondary | ICD-10-CM | POA: Diagnosis not present

## 2018-09-14 DIAGNOSIS — H2513 Age-related nuclear cataract, bilateral: Secondary | ICD-10-CM | POA: Diagnosis not present

## 2018-09-14 DIAGNOSIS — E119 Type 2 diabetes mellitus without complications: Secondary | ICD-10-CM | POA: Diagnosis not present

## 2018-09-14 DIAGNOSIS — Z794 Long term (current) use of insulin: Secondary | ICD-10-CM | POA: Diagnosis not present

## 2018-09-15 DIAGNOSIS — Z794 Long term (current) use of insulin: Secondary | ICD-10-CM | POA: Diagnosis not present

## 2018-09-15 DIAGNOSIS — S83231D Complex tear of medial meniscus, current injury, right knee, subsequent encounter: Secondary | ICD-10-CM | POA: Diagnosis not present

## 2018-09-15 DIAGNOSIS — M25461 Effusion, right knee: Secondary | ICD-10-CM | POA: Diagnosis not present

## 2018-09-15 DIAGNOSIS — E109 Type 1 diabetes mellitus without complications: Secondary | ICD-10-CM | POA: Diagnosis not present

## 2018-09-15 DIAGNOSIS — M25561 Pain in right knee: Secondary | ICD-10-CM | POA: Diagnosis not present

## 2018-09-21 ENCOUNTER — Encounter (HOSPITAL_COMMUNITY): Payer: Self-pay

## 2018-09-22 DIAGNOSIS — H25042 Posterior subcapsular polar age-related cataract, left eye: Secondary | ICD-10-CM | POA: Diagnosis not present

## 2018-09-22 DIAGNOSIS — H25041 Posterior subcapsular polar age-related cataract, right eye: Secondary | ICD-10-CM | POA: Diagnosis not present

## 2018-09-22 DIAGNOSIS — Z961 Presence of intraocular lens: Secondary | ICD-10-CM | POA: Diagnosis not present

## 2018-09-22 LAB — CUP PACEART REMOTE DEVICE CHECK
Date Time Interrogation Session: 20191001102805
Implantable Lead Implant Date: 20190206
Implantable Lead Location: 753860
Implantable Lead Model: 292
Implantable Lead Serial Number: 438194
Implantable Pulse Generator Implant Date: 20190206
Pulse Gen Serial Number: 243572

## 2018-09-23 ENCOUNTER — Telehealth (HOSPITAL_COMMUNITY): Payer: Self-pay

## 2018-09-23 DIAGNOSIS — I5022 Chronic systolic (congestive) heart failure: Secondary | ICD-10-CM

## 2018-09-23 NOTE — Telephone Encounter (Signed)
Notes recorded by Wilder Glade, LPN on 03/27/9976 at 4:14 PM EDT Pt aware and agreeable and referral placed for exercise program ------  Notes recorded by Scarlette Calico, RN on 09/21/2018 at 5:06 PM EDT Attempted to call pt, no answer and no VM ------  Notes recorded by Micki Riley, RN on 09/15/2018 at 4:19 PM EDT Called and left message to return call. ------  Notes recorded by Jolaine Artist, MD on 09/03/2018 at 5:09 PM EDT Look pretty good. No change from previous. Refer to [facility] exercise program

## 2018-09-23 NOTE — Telephone Encounter (Signed)
-----   Message from Jolaine Artist, MD sent at 09/03/2018  5:09 PM EDT ----- Look pretty good. No change from previous. Refer to [facility] exercise program.

## 2018-09-24 ENCOUNTER — Other Ambulatory Visit: Payer: Self-pay | Admitting: Internal Medicine

## 2018-09-24 NOTE — Telephone Encounter (Signed)
Replied to patient's email.  Relayed message from Dr. Melvyn Novas, "I'm afraid again that the entresto may be contributing to the low threshold for cough and if already doing everything rec at last ov  I have nothing new to suggest today but happy to see in office again - be sure to bring all meds"  Also included office phone number to call and schedule appointment.

## 2018-09-24 NOTE — Telephone Encounter (Signed)
I'm afraid again that the entresto may be contributing to the low threshold for cough and if already doing everything rec at last ov  I have nothing new to suggest today but happy to see in office again - be sure to bring all meds

## 2018-09-25 ENCOUNTER — Telehealth: Payer: Self-pay
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
## 2018-09-25 NOTE — Telephone Encounter (Signed)
Spoke to Christine Mcdowell about the [employer] at [employer].  She is due to have cataract surgery on both eyes in the coming month as well as a possible knee replacement.  She is not sure when it will be.  She would like to do the program possibly in the new year.  We will touch base later in the year when she has a better idea of what is going on.

## 2018-09-28 ENCOUNTER — Other Ambulatory Visit (HOSPITAL_COMMUNITY): Payer: Self-pay | Admitting: Student

## 2018-09-28 ENCOUNTER — Ambulatory Visit (INDEPENDENT_AMBULATORY_CARE_PROVIDER_SITE_OTHER): Payer: BLUE CROSS/BLUE SHIELD

## 2018-09-28 ENCOUNTER — Other Ambulatory Visit: Payer: Self-pay | Admitting: Nurse Practitioner

## 2018-09-28 ENCOUNTER — Telehealth: Payer: Self-pay | Admitting: Internal Medicine

## 2018-09-28 ENCOUNTER — Telehealth: Payer: Self-pay

## 2018-09-28 ENCOUNTER — Other Ambulatory Visit (HOSPITAL_COMMUNITY): Payer: Self-pay | Admitting: Adult Health

## 2018-09-28 ENCOUNTER — Ambulatory Visit: Payer: BLUE CROSS/BLUE SHIELD

## 2018-09-28 DIAGNOSIS — Z95810 Presence of automatic (implantable) cardiac defibrillator: Secondary | ICD-10-CM

## 2018-09-28 DIAGNOSIS — I5022 Chronic systolic (congestive) heart failure: Secondary | ICD-10-CM

## 2018-09-28 NOTE — Telephone Encounter (Signed)
New Message        [facility] Pre-operative Risk Assessment    Request for surgical clearance:  1. What type of surgery is being performed? Cataract  2. When is this surgery scheduled? 10/01/2018  3. What type of clearance is required (medical clearance vs. Pharmacy clearance to hold med vs. Both)? Medical  4. Are there any medications that need to be held prior to surgery and how long?   5. Practice name and name of physician performing surgery? [facility]  Dr. Rolanda Jay  6. What is your office phone number [phone]   7.   What is your office fax number [phone]  8.   Anesthesia type (None, local, MAC, general) ? Topical IV sedation    Avaletta L Williams 09/28/2018, 12:46 PM  _________________________________________________________________   (provider comments below)

## 2018-09-28 NOTE — Telephone Encounter (Signed)
   [facility] Pre-operative Risk Assessment    Request for surgical clearance:  1. What type of surgery is being performed?  Cataract extraction w/ intraocular lens implantation of the left eye followed by right eye   2. When is this surgery scheduled?  10/01/18 and 10/22/18   3. What type of clearance is required (medical clearance vs. Pharmacy clearance to hold med vs. Both)?  medical  4. Are there any medications that need to be held prior to surgery and how long?    5. Practice name and name of physician performing surgery?  [facility]   6. What is your office phone number [phone]    7.   What is your office fax number [phone]  8.   Anesthesia type (None, local, MAC, general) ?     Legrand Como  Dapp 09/28/2018, 2:16 PM  _________________________________________________________________   (provider comments below)

## 2018-09-28 NOTE — Progress Notes (Signed)
EPIC Encounter for ICM Monitoring  Patient Name: Christine Mcdowell is a 47 y.o. female Date: 09/28/2018 Primary Care Physican: [name], MD Primary Cardiologist:Bensimhon Electrophysiologist:Taylor Dry Weight:Previous weight 198lbs         Attempted call to patient and unable to reach.     HeartLogic Heart Failure Index is 8.  Index has not crossed the threshold suggesting fluid accumulation.  Prescribed: Torsemide20 mg take3tablets (60 mg total) twice a day.Metolazone 2.5 mg takeone tablet as needed for weight gain of 5 lbs within 3 days.  Potassium 10 mEq take 4 tablets (40 mEq total) as needed with Metolazone  Labs: 08/18/2018 Creatinine 1.65, BUN 19, Potassium 3.1, Sodium 142, EGFR 36-41 05/20/2018 Creatinine 1.03, BUN 14, Potassium 3.9, Sodium 140, EGFR >60 01/31/2018 Creatinine0.88, BUN13, Potassium3.7, [id number], [id number]  Recommendations: Unable to reach.  Follow-up plan: ICM clinic phone appointment on 10/29/2018.  Office appointment scheduled 10/09/2018 with Dr. Haroldine Laws.        Copy of ICM check sent to Dr. Lovena Le.    3 Month Trend    8 Day Data Trend            Christine Billings, RN 09/28/2018 8:36 AM

## 2018-09-29 ENCOUNTER — Telehealth: Payer: Self-pay

## 2018-09-29 NOTE — Telephone Encounter (Signed)
I think she should be stable enough for cataract surgery as long as no general anesthesia. thanks

## 2018-09-29 NOTE — Telephone Encounter (Signed)
Dr. Haroldine Laws  Pt needs cataract surgery.  I have not yet reached pt to make sure she is stable, but in your opinion is it ok to proceed?  Obviously if she is SOB or other issues we will postpone.   Thanks.

## 2018-09-29 NOTE — Telephone Encounter (Signed)
Follow Up:    Delle Reining calling to find out the status of clearance. She needs this asap, surgery is 10-01-18. Please fax asap to [phone].

## 2018-09-29 NOTE — Telephone Encounter (Signed)
Remote ICM transmission received.  Attempted call to patient and recording stated phone is temporarily out of service.

## 2018-09-29 NOTE — Telephone Encounter (Signed)
Please call [facility] and make sure they rec'd fax for clearance.

## 2018-09-29 NOTE — Telephone Encounter (Signed)
   Primary Cardiologist: No primary care provider on file.  Chart reviewed as part of pre-operative protocol coverage. Patient was contacted 09/29/2018 in reference to pre-operative risk assessment for pending surgery as outlined below.  Christine Mcdowell was last seen on 08/18/18 by Dr. Haroldine Laws.  Since that day, Christine Mcdowell has done will without acute shortness of breath with her known nonischemic cardiomyopathy, ICD device, Lupus, HTN.   Therefore, based on ACC/AHA guidelines, the patient would be at acceptable risk for the planned procedure without further cardiovascular testing.  We prefer not to use general anesthesia due to her medical issues.    I will route this recommendation to the requesting party via Epic fax function and remove from pre-op pool.  Please call with questions.  Cecilie Kicks, NP 09/29/2018, 2:07 PM

## 2018-10-01 ENCOUNTER — Ambulatory Visit (INDEPENDENT_AMBULATORY_CARE_PROVIDER_SITE_OTHER): Payer: BLUE CROSS/BLUE SHIELD | Admitting: Psychiatry

## 2018-10-01 ENCOUNTER — Ambulatory Visit (INDEPENDENT_AMBULATORY_CARE_PROVIDER_SITE_OTHER): Payer: BLUE CROSS/BLUE SHIELD | Admitting: Nurse Practitioner

## 2018-10-01 ENCOUNTER — Ambulatory Visit (INDEPENDENT_AMBULATORY_CARE_PROVIDER_SITE_OTHER)
Admission: RE | Admit: 2018-10-01 | Discharge: 2018-10-01 | Disposition: A | Discharge status: Home / Self Care | Payer: BLUE CROSS/BLUE SHIELD | Source: Ambulatory Visit | Attending: Nurse Practitioner | Admitting: Nurse Practitioner

## 2018-10-01 ENCOUNTER — Encounter (HOSPITAL_COMMUNITY): Payer: Self-pay | Admitting: Psychiatry

## 2018-10-01 ENCOUNTER — Encounter: Payer: Self-pay | Admitting: Nurse Practitioner

## 2018-10-01 VITALS — BP 138/80 | HR 82 | Temp 97.7°F | Ht 66.0 in | Wt 191.4 lb

## 2018-10-01 VITALS — BP 150/93 | HR 83 | Ht 66.0 in | Wt 191.0 lb

## 2018-10-01 DIAGNOSIS — G4700 Insomnia, unspecified: Secondary | ICD-10-CM

## 2018-10-01 DIAGNOSIS — R059 Cough, unspecified: Secondary | ICD-10-CM

## 2018-10-01 DIAGNOSIS — R05 Cough: Secondary | ICD-10-CM

## 2018-10-01 DIAGNOSIS — F419 Anxiety disorder, unspecified: Secondary | ICD-10-CM

## 2018-10-01 DIAGNOSIS — R058 Other specified cough: Secondary | ICD-10-CM

## 2018-10-01 DIAGNOSIS — F321 Major depressive disorder, single episode, moderate: Secondary | ICD-10-CM

## 2018-10-01 DIAGNOSIS — R0602 Shortness of breath: Secondary | ICD-10-CM | POA: Diagnosis not present

## 2018-10-01 MED ORDER — SERTRALINE HCL 50 MG PO TABS: ORAL_TABLET | ORAL | 0 refills | Status: DC

## 2018-10-01 MED ORDER — ALBUTEROL SULFATE HFA 108 (90 BASE) MCG/ACT IN AERS: 2.0000 | INHALATION_SPRAY | Freq: Every day | RESPIRATORY_TRACT | 0 refills | Status: DC | PRN

## 2018-10-01 MED ORDER — BENZONATATE 100 MG PO CAPS: 100.0000 mg | ORAL_CAPSULE | Freq: Three times a day (TID) | ORAL | 1 refills | Status: DC

## 2018-10-01 NOTE — Patient Instructions (Signed)
Will order tessalon pearls Will reorder Proventil Continue current medications Follow up with Dr. Melvyn Novas at first available appt or sooner if needed

## 2018-10-01 NOTE — Progress Notes (Signed)
Chart and office note reviewed in detail  > agree with a/p as outlined    

## 2018-10-01 NOTE — Progress Notes (Signed)
@Patient  ID: Christine Mcdowell, female    DOB: 1971/03/01, 47 y.o.   MRN: [id number]  Chief Complaint  Patient presents with  . Cough    Referring provider: [name], MD  HPI 47 year old female never smoker with asthma and upper airway cough syndrome followed by Dr. Melvyn Novas.  Tests: On and off acei since 01/2014 and on entresto since 06/2017 per mar review - Allergy profile 01/19/2018 >  Eos 0.0 /  IgE  2 RAST neg - Sinus CT 08/05/2018 neg  - 08/05/2018 rec trial off entresto > cough resolved >>  restarted by pt/[place] 08/20/18 s flare as of 09/02/2018   OV - 10/01/18 - Chronic cough Patient presents for another flare of cough and congestion. She states that her symptoms started back 7 days ago. States that symptoms are progressively worsening.  She is taking benadryl 3 times per day. At last visit with Dr. Melvyn Novas she was advised to take chlorpheniramine, but states that it did not help so she stopped it. She denies any fever, shortness of breath, or edema. Her weight is stable.     Allergies  Allergen Reactions  . Hydromorphone Nausea And Vomiting    Other reaction(s): GI Upset (intolerance), Hypertension (intolerance) Raises blood pressure  Other reaction(s): GI Upset (intolerance), Hypertension (intolerance) Raises blood pressure to stroke level  . Iodinated Diagnostic Agents Other (See Comments)    Shuts down kidneys Shuts kidney function down  . Other Other (See Comments) and Anaphylaxis    Spicy foods and seasonings Skin Prep "makes my skin peel off" Paper tape causes skin burns  . Erythromycin Nausea And Vomiting  . Latex Hives  . Mircette [Desogestrel-Ethinyl Estradiol] Nausea And Vomiting and Rash    Immunization History  Administered Date(s) Administered  . Influenza, High Dose Seasonal PF 09/02/2018  . Influenza, Seasonal, Injecte, Preservative Fre 11/06/2017  . Influenza,inj,Quad PF,6+ Mos 11/06/2017  . Influenza-Unspecified 08/08/2017    Past Medical  History:  Diagnosis Date  . AICD (automatic cardioverter/defibrillator) present 01/28/2018  . Anemia   . Anginal pain ([place])   . Asthma   . Cervical cancer ([place])   . CHF (congestive heart failure) ([place])   . Diabetes mellitus without complication ([employer])    steroid induced  . Discoid lupus   . Fibromyalgia   . History of blood transfusion "several"   "related to anemia; had some w/hysterectomy also"  . Hx of cardiovascular stress test    ETT-Myoview (9/15):  No ischemia, EF 52%; NORMAL  . Hx of echocardiogram    Echo (9/15):  EF 50-55%, ant HK, Gr 1 DD, mild MR, mild LAE, no effusion  . Hypertension   . Iron deficiency anemia    h/o iron transfusions  . Lupus (systemic lupus erythematosus) ([place])   . Migraine    "a few/year" (07/03/2016)  . Pneumonia 12/2015  . RA (rheumatoid arthritis) ([place])    "all over" (07/03/2016)  . Sickle cell trait ([place])   . Stroke ([place]) 2014 X 1; 2015 X 2; 2016 X 1;    "right side of face more relaxed than the other; rare speech hesitation" (07/03/2016)  . Vaginal Pap smear, abnormal    ASCUS; HPV    Tobacco History: Social History   Tobacco Use  Smoking Status Never Smoker  Smokeless Tobacco Never Used   Counseling given: Yes   Outpatient Encounter Medications as of 10/01/2018  Medication Sig  . ACCU-CHEK FASTCLIX LANCETS MISC USE TO PRICK FINGER TO TEST BLOOD SUGAR  BEFORE BREAKFAST AND DINNER  . albuterol (ACCUNEB) 1.25 MG/3ML nebulizer solution Take 3 mLs (1.25 mg total) by nebulization every 6 (six) hours as needed for wheezing. (Patient taking differently: Take 1 ampule by nebulization 2 (two) times daily. )  . albuterol (PROVENTIL HFA;VENTOLIN HFA) 108 (90 Base) MCG/ACT inhaler Inhale 2 puffs into the lungs daily as needed for shortness of breath.  Marland Kitchen aspirin 81 MG chewable tablet Chew 162 mg by mouth every morning.   . carvedilol (COREG) 25 MG tablet TAKE 1 TABLET(25 MG) BY MOUTH TWICE DAILY WITH A MEAL  . cromolyn (OPTICROM) 4 % ophthalmic  solution Place 1 drop into both eyes daily.  . diclofenac sodium (VOLTAREN) 1 % GEL Apply topically 2 (two) times daily. Apply to joints  . folic acid (FOLVITE) 1 MG tablet TAKE 1 TABLET BY MOUTH EVERY MORNING  . hydrALAZINE (APRESOLINE) 50 MG tablet Take 2 tablets (100 mg total) by mouth 3 (three) times daily.  . hydroxychloroquine (PLAQUENIL) 200 MG tablet Take 1 tablet (200 mg total) by mouth 2 (two) times daily.  . insulin aspart (NOVOLOG) 100 UNIT/ML injection Inject 5-10 Units into the skin See admin instructions. Only if blood sugar is above 150; on sliding scale  . Magnesium Oxide 200 MG TABS Take 2 tablets (400 mg total) by mouth 2 (two) times daily.  . meloxicam (MOBIC) 15 MG tablet Take 15 mg by mouth daily.   . metFORMIN (GLUCOPHAGE) 500 MG tablet Take 500 mg by mouth daily with breakfast.  . methotrexate (RHEUMATREX) 2.5 MG tablet Take 20 mg by mouth once a week. On Friday  . metolazone (ZAROXOLYN) 2.5 MG tablet TAKE 1 TABLET BY MOUTH AS NEEDED FOR WEIGHT GAIN OF 5 POUNDS WITHIN 3 DAYS AS DIRECTED  . montelukast (SINGULAIR) 10 MG tablet TAKE 1 TABLET BY MOUTH DAILY  . Multiple Vitamin (MULTIVITAMIN WITH MINERALS) TABS Take 1 tablet by mouth every morning.   . Olopatadine HCl 0.2 % SOLN Apply 1 drop to eye daily.  . ondansetron (ZOFRAN) 4 MG tablet Take 1 tablet (4 mg total) by mouth every 8 (eight) hours as needed for nausea or vomiting.  . pantoprazole (PROTONIX) 40 MG tablet TAKE 1 TABLET(40 MG) BY MOUTH DAILY 30 TO 60 MINUTES BEFORE FIRST MEAL OF THE DAY  . potassium chloride (K-DUR) 10 MEQ tablet Take 40 mEq by mouth daily as needed (with metolazone).  . potassium chloride SA (KLOR-CON M20) 20 MEQ tablet Take 2 tablets by mouth daily as needed. With Metolazone  . predniSONE (DELTASONE) 5 MG tablet Take 17.5 mg by mouth daily with breakfast.  . sacubitril-valsartan (ENTRESTO) 24-26 MG Take 1 tablet by mouth 2 (two) times daily.  . Sarilumab (KEVZARA) 150 MG/1.14ML SOAJ Inject into  the skin every 14 (fourteen) days.  Marland Kitchen spironolactone (ALDACTONE) 25 MG tablet Take 1 tablet (25 mg total) by mouth 2 (two) times daily.  . Tofacitinib Citrate 5 MG TABS Take 5 mg by mouth 2 (two) times daily.  Marland Kitchen torsemide (DEMADEX) 20 MG tablet Take 3 tablets (60 mg total) by mouth 2 (two) times daily.  . traZODone (DESYREL) 50 MG tablet Take 1 tablet by mouth at bedtime.  . TRESIBA FLEXTOUCH 100 UNIT/ML SOPN FlexTouch Pen Inject 10 Units into the skin every evening.   . Vitamin D, Ergocalciferol, (DRISDOL) 50000 units CAPS capsule TAKE 1 CAPSULE BY MOUTH TWICE WEEKLY(ON TUESDAY AND FRIDAY)  . [DISCONTINUED] albuterol (PROVENTIL HFA;VENTOLIN HFA) 108 (90 Base) MCG/ACT inhaler Inhale 2 puffs into the lungs  daily as needed for shortness of breath.  . benzonatate (TESSALON) 100 MG capsule Take 1 capsule (100 mg total) by mouth 3 (three) times daily.  . [DISCONTINUED] clonazePAM (KLONOPIN) 0.5 MG tablet Take 1 tablet (0.5 mg total) by mouth 3 (three) times daily as needed (Anxiety). (Patient not taking: Reported on 10/01/2018)  . [DISCONTINUED] famotidine (PEPCID) 20 MG tablet One at bedtime (Patient not taking: Reported on 10/01/2018)   No facility-administered encounter medications on file as of 10/01/2018.      Review of Systems  Review of Systems  Constitutional: Negative.  Negative for chills and fever.  HENT: Negative.  Negative for congestion, sinus pressure and sinus pain.   Respiratory: Positive for cough. Negative for shortness of breath and wheezing.   Cardiovascular: Negative.  Negative for chest pain, palpitations and leg swelling.  Gastrointestinal: Negative.   Allergic/Immunologic: Negative.   Neurological: Negative.   Psychiatric/Behavioral: Negative.        Physical Exam  BP 138/80 (BP Location: Right Arm, Patient Position: Sitting, Cuff Size: Normal)   Pulse 82   Temp 97.7 F (36.5 C)   Ht 5\' 6"  (1.676 m)   Wt 191 lb 6.4 oz (86.8 kg)   SpO2 98%   BMI 30.89 kg/m     Wt Readings from Last 5 Encounters:  10/01/18 191 lb 6.4 oz (86.8 kg)  09/02/18 193 lb 3.2 oz (87.6 kg)  08/18/18 192 lb (87.1 kg)  08/05/18 194 lb 9.6 oz (88.3 kg)  05/20/18 192 lb (87.1 kg)     Physical Exam  Constitutional: She is oriented to person, place, and time. She appears well-developed and well-nourished. No distress.  Cardiovascular: Normal rate and regular rhythm.  Pulmonary/Chest: Effort normal and breath sounds normal. No respiratory distress. She has no wheezes. She has no rales.  Neurological: She is alert and oriented to person, place, and time.  Psychiatric: She has a normal mood and affect.  Nursing note and vitals reviewed.     Imaging: Dg Chest 2 View  Result Date: 10/01/2018 CLINICAL DATA:  Cough, congestion, shortness of breath for 1 week EXAM: CHEST - 2 VIEW COMPARISON:  09/02/2018 FINDINGS: There is no focal parenchymal opacity. There is no pleural effusion or pneumothorax. There is stable cardiomegaly. There is a single lead cardiac pacemaker. The osseous structures are unremarkable. IMPRESSION: No active cardiopulmonary disease. Electronically Signed   By: Kathreen Devoid   On: 10/01/2018 13:20   Dg Chest 2 View  Result Date: 09/03/2018 CLINICAL DATA:  Cough and congestion for 1 week EXAM: CHEST - 2 VIEW COMPARISON:  07/08/2018 FINDINGS: Cardiac shadow is mildly enlarged but stable. Defibrillator is again seen. The lungs are well aerated bilaterally without focal infiltrate or sizable effusion. No bony abnormality is noted. IMPRESSION: No active cardiopulmonary disease. Electronically Signed   By: Inez Catalina M.D.   On: 09/03/2018 09:24     Assessment & Plan:   Upper airway cough syndrome Patient Instructions  Will order chest x ray and call with results Will order tessalon pearls Will reorder Proventil Continue current medications Follow up with Dr. Melvyn Novas at first available appt or sooner if needed        Fenton Foy,  NP 10/01/2018

## 2018-10-01 NOTE — Patient Instructions (Signed)
1. Start sertraline 25 mg daily for one week, then 50 mg daily  2. Referral to therapy 3. Return to clinic in one month for 30 mins

## 2018-10-01 NOTE — Assessment & Plan Note (Addendum)
Patient Instructions  Will order chest x ray and call with results Will order tessalon pearls Will reorder Proventil Continue current medications Follow up with Dr. Melvyn Novas at first available appt or sooner if needed

## 2018-10-02 ENCOUNTER — Other Ambulatory Visit (HOSPITAL_COMMUNITY): Payer: Self-pay | Admitting: Internal Medicine

## 2018-10-09 ENCOUNTER — Ambulatory Visit (HOSPITAL_COMMUNITY)
Admission: RE | Admit: 2018-10-09 | Discharge: 2018-10-09 | Disposition: A | Discharge status: Home / Self Care | Payer: BLUE CROSS/BLUE SHIELD | Source: Ambulatory Visit | Attending: Internal Medicine | Admitting: Internal Medicine

## 2018-10-09 ENCOUNTER — Encounter (HOSPITAL_COMMUNITY): Payer: Self-pay | Admitting: Internal Medicine

## 2018-10-09 ENCOUNTER — Other Ambulatory Visit: Payer: Self-pay

## 2018-10-09 VITALS — BP 126/75 | HR 95 | Wt 192.4 lb

## 2018-10-09 DIAGNOSIS — E119 Type 2 diabetes mellitus without complications: Secondary | ICD-10-CM | POA: Insufficient documentation

## 2018-10-09 DIAGNOSIS — Z7982 Long term (current) use of aspirin: Secondary | ICD-10-CM | POA: Insufficient documentation

## 2018-10-09 DIAGNOSIS — Z7952 Long term (current) use of systemic steroids: Secondary | ICD-10-CM | POA: Diagnosis not present

## 2018-10-09 DIAGNOSIS — G4730 Sleep apnea, unspecified: Secondary | ICD-10-CM | POA: Diagnosis not present

## 2018-10-09 DIAGNOSIS — I5022 Chronic systolic (congestive) heart failure: Secondary | ICD-10-CM | POA: Insufficient documentation

## 2018-10-09 DIAGNOSIS — Z95810 Presence of automatic (implantable) cardiac defibrillator: Secondary | ICD-10-CM | POA: Insufficient documentation

## 2018-10-09 DIAGNOSIS — Z8541 Personal history of malignant neoplasm of cervix uteri: Secondary | ICD-10-CM | POA: Diagnosis not present

## 2018-10-09 DIAGNOSIS — Z8673 Personal history of transient ischemic attack (TIA), and cerebral infarction without residual deficits: Secondary | ICD-10-CM | POA: Insufficient documentation

## 2018-10-09 DIAGNOSIS — D573 Sickle-cell trait: Secondary | ICD-10-CM | POA: Diagnosis not present

## 2018-10-09 DIAGNOSIS — I110 Hypertensive heart disease with heart failure: Secondary | ICD-10-CM | POA: Insufficient documentation

## 2018-10-09 DIAGNOSIS — Z794 Long term (current) use of insulin: Secondary | ICD-10-CM | POA: Diagnosis not present

## 2018-10-09 DIAGNOSIS — Z90710 Acquired absence of both cervix and uterus: Secondary | ICD-10-CM | POA: Diagnosis not present

## 2018-10-09 DIAGNOSIS — I10 Essential (primary) hypertension: Secondary | ICD-10-CM | POA: Diagnosis not present

## 2018-10-09 DIAGNOSIS — I428 Other cardiomyopathies: Secondary | ICD-10-CM | POA: Insufficient documentation

## 2018-10-09 DIAGNOSIS — G4733 Obstructive sleep apnea (adult) (pediatric): Secondary | ICD-10-CM | POA: Diagnosis not present

## 2018-10-09 DIAGNOSIS — D688 Other specified coagulation defects: Secondary | ICD-10-CM | POA: Diagnosis not present

## 2018-10-09 DIAGNOSIS — J45909 Unspecified asthma, uncomplicated: Secondary | ICD-10-CM | POA: Diagnosis not present

## 2018-10-09 DIAGNOSIS — Z79899 Other long term (current) drug therapy: Secondary | ICD-10-CM | POA: Insufficient documentation

## 2018-10-09 DIAGNOSIS — M797 Fibromyalgia: Secondary | ICD-10-CM | POA: Insufficient documentation

## 2018-10-09 DIAGNOSIS — M069 Rheumatoid arthritis, unspecified: Secondary | ICD-10-CM | POA: Diagnosis not present

## 2018-10-09 DIAGNOSIS — M329 Systemic lupus erythematosus, unspecified: Secondary | ICD-10-CM

## 2018-10-09 DIAGNOSIS — G43909 Migraine, unspecified, not intractable, without status migrainosus: Secondary | ICD-10-CM | POA: Diagnosis not present

## 2018-10-09 DIAGNOSIS — R0981 Nasal congestion: Secondary | ICD-10-CM

## 2018-10-09 DIAGNOSIS — Z791 Long term (current) use of non-steroidal anti-inflammatories (NSAID): Secondary | ICD-10-CM | POA: Insufficient documentation

## 2018-10-09 MED ORDER — SACUBITRIL-VALSARTAN 49-51 MG PO TABS: 1.0000 | ORAL_TABLET | Freq: Two times a day (BID) | ORAL | 3 refills | Status: DC

## 2018-10-09 MED ORDER — IPRATROPIUM BROMIDE 0.03 % NA SOLN: 2.0000 | Freq: Four times a day (QID) | NASAL | 12 refills | Status: DC | PRN

## 2018-10-09 NOTE — Progress Notes (Signed)
Advanced Heart Failure Clinic Note    Primary Cardiologist:  Dr Johnsie Cancel  Rheumatologist: Dr Eliberto Ivory  HF: Dr. Haroldine Laws   HPI: Christine Mcdowell is a 47 y.o. 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   Admitted in October 2017 with increased dyspnea and volume overload. Diuresed with IV lasix and transitioned to lasix 40 mg twice a day. LHC/RHC normal filling pressures, EF ~20%, and normal coronaries. Discharge weight 184 pounds.   Admitted to [facility] in September 2019  for ACL repair. Also received IV lasix during hospital admit.   She presents today for follow up. Recently ICD showed possible VT, but I reviewed with Dr. Lovena Le and we felt it was sinus tach vs atrial flutter. For several months she has struggled with cough and upper respiratory infection. Was told she had bronchitis and PNA. Saw Dr. Melvyn Novas who felt it might be related to [facility]. He stopped Entresto and started micardis + hydrocodone cough syrup. Cough resolved but HF got worse. We restarted Entresto. She tolerated it well. But now has been struggling with recurrent URI symptoms. Feels terrible. Very congested. Says it always starts out as allergies then gets worse until she gets abx. Recently just finished her 4th round of abx. On chronic prednisone for lupus. Now taking tessalon perles. Weight relatively flat in bed. +orthopnea and PND. + edema.   Says she has tolerated high-dose Entresto in past without problem. Has seen allergist and said she has a high-level of "generalized allergy to everything".  Recent CPX 9/19 with relaltively normal spirometry. Mild HF limitation.    Device interrogated: HL score 12 No VT/AF. Fluid was very low now back to baseline. Activity 1.5 hours/day. Mean HR 84 Personally reviewed  Cardiac MRI in 02/2015 showed EF 46%. No LGE.  ECHO 11/2017  --->EF 25-30% RV ok.   CPX 9/19  FVC 2.47 (74%)    FEV1 2.17 (81%)     FEV1/FVC 88 (108%)     MVV 87 (84%)     Resting HR: 88 Peak HR: 139  (81% age predicted max HR) BP rest: 90/58 BP peak: 130/58  Peak VO2: 16.3 (75% predicted peak VO2) - when corrected to ibw 21.0 VE/VCO2 slope: 30 OUES: 1.66 Peak RER: 1.19 Ventilatory Threshold: 13.0 (60% predicted and 80% measured peak VO2) O2pulse: 11  (100% predicted O2pulse)  CPX 10/2016 Peak VO2: 18.5 (82% predicted peak VO2) - - when corrected to ibw 23.5 VE/VCO2 slope: 30 OUES: 1.75 Peak RER: 1.14.  RHC/LHC 10/11/2016  RA = 4 RV = 24/5 PA = 23/4 (16) PCW = 9 Fick cardiac output/index = 4.4/2.2 SVR = 1580 PVR =  1.5 WU Ao sat = 95% PA sat = 58%, 62% Assessment: 1. Normal coronary arteries with separate ostial for LAD and LCX 2. Normal right heart pressures 3. NICM with EF ~20% by echo  Review of systems complete and found to be negative unless listed in HPI.    SH:  Social History   Socioeconomic History  . Marital status: Divorced    Spouse name: Not on file  . Number of children: Not on file  . Years of education: Not on file  . Highest education level: Not on file  Occupational History  . Not on file  Social Needs  . Financial resource strain: Not on file  . Food insecurity:    Worry: Not on  file    Inability: Not on file  . Transportation needs:    Medical: Not on file    Non-medical: Not on file  Tobacco Use  . Smoking status: Never Smoker  . Smokeless tobacco: Never Used  Substance and Sexual Activity  . Alcohol use: No  . Drug use: No  . Sexual activity: Not Currently    Birth control/protection: Surgical    Comment: hyst  Lifestyle  . Physical activity:    Days per week: Not on file    Minutes per session: Not on file  . Stress: Not on file  Relationships  . Social connections:    Talks on phone: Not on file    Gets together: Not on file    Attends religious service: Not  on file    Active member of club or organization: Not on file    Attends meetings of clubs or organizations: Not on file    Relationship status: Not on file  . Intimate partner violence:    Fear of current or ex partner: Not on file    Emotionally abused: Not on file    Physically abused: Not on file    Forced sexual activity: Not on file  Other Topics Concern  . Not on file  Social History Narrative  . Not on file   FH:  Family History  Problem Relation Age of Onset  . Arthritis Mother   . Heart murmur Mother   . Drug abuse Mother   . Allergies Mother   . Heart attack Father   . Cushing syndrome Father   . Depression Father   . Allergies Father   . Dementia Paternal Grandmother   . Cancer Paternal Grandfather   . Diabetes Maternal Grandmother   . Hypertension Maternal Grandmother   . Asthma Maternal Grandmother   . Heart attack Maternal Grandfather    Past Medical History:  Diagnosis Date  . AICD (automatic cardioverter/defibrillator) present 01/28/2018  . Anemia   . Anginal pain ([place])   . Asthma   . Cervical cancer ([place])   . CHF (congestive heart failure) ([place])   . Diabetes mellitus without complication ([place])    steroid induced  . Discoid lupus   . Fibromyalgia   . History of blood transfusion "several"   "related to anemia; had some w/hysterectomy also"  . Hx of cardiovascular stress test    ETT-Myoview (9/15):  No ischemia, EF 52%; NORMAL  . Hx of echocardiogram    Echo (9/15):  EF 50-55%, ant HK, Gr 1 DD, mild MR, mild LAE, no effusion  . Hypertension   . Iron deficiency anemia    h/o iron transfusions  . Lupus (systemic lupus erythematosus) ([place])   . Migraine    "a few/year" (07/03/2016)  . Pneumonia 12/2015  . RA (rheumatoid arthritis) ([place])    "all over" (07/03/2016)  . Sickle cell trait ([place])   . Stroke ([place]) 2014 X 1; 2015 X 2; 2016 X 1;    "right side of face more relaxed than the other; rare speech hesitation" (07/03/2016)  . Vaginal Pap smear,  abnormal    ASCUS; HPV    Current Outpatient Medications  Medication Sig Dispense Refill  . ACCU-CHEK FASTCLIX LANCETS MISC USE TO PRICK FINGER TO TEST BLOOD SUGAR BEFORE BREAKFAST AND DINNER  10  . albuterol (ACCUNEB) 1.25 MG/3ML nebulizer solution Take 3 mLs (1.25 mg total) by nebulization every 6 (six) hours as needed for wheezing. (Patient taking differently: Take 1 ampule  by nebulization 2 (two) times daily. ) 75 mL 12  . albuterol (PROVENTIL HFA;VENTOLIN HFA) 108 (90 Base) MCG/ACT inhaler Inhale 2 puffs into the lungs daily as needed for shortness of breath. 1 Inhaler 0  . aspirin 81 MG chewable tablet Chew 162 mg by mouth every morning.     . benzonatate (TESSALON) 100 MG capsule Take 1 capsule (100 mg total) by mouth 3 (three) times daily. 30 capsule 1  . carvedilol (COREG) 25 MG tablet TAKE 1 TABLET(25 MG) BY MOUTH TWICE DAILY WITH A MEAL 60 tablet 6  . citalopram (CELEXA) 10 MG tablet TAKE 1 TABLET BY MOUTH EVERY DAY 90 tablet 0  . cromolyn (OPTICROM) 4 % ophthalmic solution Place 1 drop into both eyes daily.  0  . diclofenac sodium (VOLTAREN) 1 % GEL Apply topically 2 (two) times daily. Apply to joints  2  . folic acid (FOLVITE) 1 MG tablet TAKE 1 TABLET BY MOUTH EVERY MORNING 30 tablet 0  . hydrALAZINE (APRESOLINE) 50 MG tablet Take 2 tablets (100 mg total) by mouth 3 (three) times daily. 180 tablet 6  . hydroxychloroquine (PLAQUENIL) 200 MG tablet Take 1 tablet (200 mg total) by mouth 2 (two) times daily. 60 tablet 6  . insulin aspart (NOVOLOG) 100 UNIT/ML injection Inject 5-10 Units into the skin See admin instructions. Only if blood sugar is above 150; on sliding scale    . Magnesium Oxide 200 MG TABS Take 2 tablets (400 mg total) by mouth 2 (two) times daily. 120 tablet 6  . meloxicam (MOBIC) 15 MG tablet Take 15 mg by mouth daily.     . metFORMIN (GLUCOPHAGE) 500 MG tablet Take 500 mg by mouth daily with breakfast.    . methotrexate (RHEUMATREX) 2.5 MG tablet Take 20 mg by  mouth once a week. On Friday  3  . metolazone (ZAROXOLYN) 2.5 MG tablet TAKE 1 TABLET BY MOUTH AS NEEDED FOR WEIGHT GAIN OF 5 POUNDS WITHIN 3 DAYS AS DIRECTED 5 tablet 3  . montelukast (SINGULAIR) 10 MG tablet TAKE 1 TABLET BY MOUTH DAILY 30 tablet 0  . Multiple Vitamin (MULTIVITAMIN WITH MINERALS) TABS Take 1 tablet by mouth every morning.     . Olopatadine HCl 0.2 % SOLN Apply 1 drop to eye daily.  6  . ondansetron (ZOFRAN) 4 MG tablet Take 1 tablet (4 mg total) by mouth every 8 (eight) hours as needed for nausea or vomiting. 20 tablet 0  . pantoprazole (PROTONIX) 40 MG tablet TAKE 1 TABLET(40 MG) BY MOUTH DAILY 30 TO 60 MINUTES BEFORE FIRST MEAL OF THE DAY 30 tablet 3  . potassium chloride (K-DUR) 10 MEQ tablet Take 40 mEq by mouth daily as needed (with metolazone).    . potassium chloride SA (K-DUR,KLOR-CON) 20 MEQ tablet TAKE 2 TABLETS(40 MEQ) BY MOUTH THREE TIMES DAILY 180 tablet 5  . potassium chloride SA (KLOR-CON M20) 20 MEQ tablet Take 2 tablets by mouth daily as needed. With Metolazone    . predniSONE (DELTASONE) 5 MG tablet Take 17.5 mg by mouth daily with breakfast.    . sacubitril-valsartan (ENTRESTO) 24-26 MG Take 1 tablet by mouth 2 (two) times daily. 60 tablet 3  . Sarilumab (KEVZARA) 150 MG/1.14ML SOAJ Inject into the skin every 14 (fourteen) days.    Marland Kitchen sertraline (ZOLOFT) 50 MG tablet 25 mg daily for one week, then 50 mg daily 30 tablet 0  . spironolactone (ALDACTONE) 25 MG tablet Take 1 tablet (25 mg total) by mouth 2 (two)  times daily. 60 tablet 6  . Tofacitinib Citrate 5 MG TABS Take 5 mg by mouth 2 (two) times daily.    Marland Kitchen torsemide (DEMADEX) 20 MG tablet Take 3 tablets (60 mg total) by mouth 2 (two) times daily. 180 tablet 6  . traZODone (DESYREL) 50 MG tablet Take 1 tablet by mouth at bedtime.  5  . TRESIBA FLEXTOUCH 100 UNIT/ML SOPN FlexTouch Pen Inject 10 Units into the skin every evening.   0  . Vitamin D, Ergocalciferol, (DRISDOL) 50000 units CAPS capsule TAKE 1 CAPSULE  BY MOUTH TWICE WEEKLY(ON TUESDAY AND FRIDAY) 8 capsule 0   No current facility-administered medications for this visit.    Vitals:   10/09/18 1509  BP: 126/75  Pulse: 95  SpO2: 94%  Weight: 87.3 kg (192 lb 6.4 oz)     Wt Readings from Last 3 Encounters:  10/01/18 86.8 kg (191 lb 6.4 oz)  09/02/18 87.6 kg (193 lb 3.2 oz)  08/18/18 87.1 kg (192 lb)   PHYSICAL EXAM:  General:  Severe nasals congestion and rhinorrhea.  No resp difficulty HEENT: normal Neck: supple. JVP 8-9 Carotids 2+ bilat; no bruits. No lymphadenopathy or thryomegaly appreciated. Cor: PMI nondisplaced. Regular rate & rhythm. No rubs, gallops or murmurs. Lungs: clear Abdomen: obese soft, nontender, nondistended. No hepatosplenomegaly. No bruits or masses. Good bowel sounds. Extremities: no cyanosis, clubbing, rash, trace-1+ ankle edema Neuro: alert & orientedx3, cranial nerves grossly intact. moves all 4 extremities w/o difficulty. Affect pleasant   ASSESSMENT & PLAN: 1. Chronic Systolic Heart Failure - NICM Cath 10/11/16 with normal cors. CPX 10/2016: Peak VO2: 18.5 (82% predicted peak VO2), VE/VCO2 slope: 30. ECHO 12/18 EF 25-30%  - S/P [employer] HeartLogic ICD 01/2018.  - Cardiac MRI in 02/2015 showed EF 46%. No LGE. - Repeat CPX 9/19 with mild HR limitation - Remains NYHA III but doubt this is primarily related to HF  - Volume status mildly elevated on exam but OK by ICD interrogation - Continue torsemide 60 mg twice a day. Take extra as needed - Continue metolazone as needed.  - Continue spiro 25 mg BID.  - Continue coreg 6.25 mg BID.  - Increase Entresto to 49/51 mg BID. By history, neither she nor I suspect that upper repiratory symptoms are related to Entestom - Continue hydralazine 100 mg TID. Not on imdur due to headaches.  - She has been consented for cardiomems, but will plan to monitor her with HeartLogic device for now unless she has frequent hospitalizations for CHF 2. Sinus tach vs atrial  flutter on ICD - Follows with Dr. Lovena Le. Nowresolved 3.  Lupus  - On chronic prednisone.  - Per PCP.  4. HTN - Meds as above.  5. OSA - Continue nightly CPAP.  6. Migraines - Per PCP.  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 No change.  8. Upper respiratory symptoms - symptoms seem much like severe allergies to me and also seem to have somewhat of a seasonal component. They have been quite debilitating to her. She has seen Dr. Melvyn Novas and serum eos and IgE were low. RAST negative. Has seen allergist in past who told her she was "allergic to everything." I discussed with Dr. [place] Bells. We will attempt trial of nasal Atrovent and he will review her chart and arrange f/u if needed.   Total time spent 45 minutes. Over half that time spent discussing above.   Glori Bickers, MD  10:04 AM

## 2018-10-09 NOTE — Patient Instructions (Signed)
Increase Entresto to 49/51 mg Twice daily   Start Atrovent 2 sprays every 6 hours as needed  Your physician recommends that you schedule a follow-up appointment in: 2-3 months

## 2018-10-13 ENCOUNTER — Telehealth: Payer: Self-pay | Admitting: Pulmonary Disease

## 2018-10-13 ENCOUNTER — Other Ambulatory Visit: Payer: Self-pay | Admitting: Obstetrics & Gynecology

## 2018-10-13 NOTE — Telephone Encounter (Signed)
Looked at schedule in epic and first avail appt with Dr. [place] Bells is not going to be until Monday, 11/25  Dr. [place] Bells, please advise if it would be okay for pt to wait this long to have an appt with you or if we need to get her scheduled for a sooner appt?  If we need to get her in for a sooner appt, please advise when. Thanks!

## 2018-10-13 NOTE — Telephone Encounter (Signed)
Fine with me

## 2018-10-13 NOTE — Telephone Encounter (Signed)
Doctor Melvyn Novas,       Patient has been seeing you, last OV 09/02/18. See below message from Dr. [place] Bells.   Are you okay if we switch providers?   Thanks,  Vic Blackbird,   Per cardiology request please arrange 30 min visit with me.   Ruby Cola

## 2018-10-14 DIAGNOSIS — E109 Type 1 diabetes mellitus without complications: Secondary | ICD-10-CM | POA: Diagnosis not present

## 2018-10-14 DIAGNOSIS — Z794 Long term (current) use of insulin: Secondary | ICD-10-CM | POA: Diagnosis not present

## 2018-10-14 NOTE — Telephone Encounter (Signed)
Put her on in the afternoon and block all open slots next week in [facility]

## 2018-10-14 NOTE — Telephone Encounter (Signed)
BQ there are no 30 minute slots next week in [facility] either, only 15 minutes slots available.

## 2018-10-14 NOTE — Telephone Encounter (Signed)
Nothing at [facility] next week or sooner?

## 2018-10-15 NOTE — Telephone Encounter (Signed)
Called and spoke to pt. Pt has appt for HP with BQ on 10/20/18 at 215 (all other open slots have been blocked). Nothing further needed at this time.

## 2018-10-17 ENCOUNTER — Emergency Department (HOSPITAL_COMMUNITY): Payer: BLUE CROSS/BLUE SHIELD

## 2018-10-17 ENCOUNTER — Encounter (HOSPITAL_COMMUNITY): Payer: Self-pay

## 2018-10-17 ENCOUNTER — Emergency Department (HOSPITAL_COMMUNITY)
Admission: EM | Admit: 2018-10-17 | Discharge: 2018-10-18 | Disposition: A | Discharge status: Home / Self Care | Payer: BLUE CROSS/BLUE SHIELD | Attending: Emergency Medicine | Admitting: Emergency Medicine

## 2018-10-17 DIAGNOSIS — J45909 Unspecified asthma, uncomplicated: Secondary | ICD-10-CM | POA: Insufficient documentation

## 2018-10-17 DIAGNOSIS — Z794 Long term (current) use of insulin: Secondary | ICD-10-CM | POA: Insufficient documentation

## 2018-10-17 DIAGNOSIS — I10 Essential (primary) hypertension: Secondary | ICD-10-CM | POA: Diagnosis not present

## 2018-10-17 DIAGNOSIS — R0789 Other chest pain: Secondary | ICD-10-CM | POA: Diagnosis not present

## 2018-10-17 DIAGNOSIS — I5022 Chronic systolic (congestive) heart failure: Secondary | ICD-10-CM | POA: Diagnosis not present

## 2018-10-17 DIAGNOSIS — E119 Type 2 diabetes mellitus without complications: Secondary | ICD-10-CM | POA: Insufficient documentation

## 2018-10-17 DIAGNOSIS — R079 Chest pain, unspecified: Secondary | ICD-10-CM | POA: Diagnosis not present

## 2018-10-17 DIAGNOSIS — Z79899 Other long term (current) drug therapy: Secondary | ICD-10-CM | POA: Diagnosis not present

## 2018-10-17 DIAGNOSIS — I110 Hypertensive heart disease with heart failure: Secondary | ICD-10-CM | POA: Insufficient documentation

## 2018-10-17 LAB — BASIC METABOLIC PANEL
Anion gap: 8 (ref 5–15)
BUN: 15 mg/dL (ref 6–20)
CO2: 28 mmol/L (ref 22–32)
Calcium: 9 mg/dL (ref 8.9–10.3)
Chloride: 104 mmol/L (ref 98–111)
Creatinine, Ser: 1.1 mg/dL — ABNORMAL HIGH (ref 0.44–1.00)
GFR calc Af Amer: >60 mL/min (ref >60)
GFR calc non Af Amer: 58 mL/min — ABNORMAL LOW (ref >60)
Glucose, Bld: 178 mg/dL — ABNORMAL HIGH (ref 70–99)
Potassium: 4 mmol/L (ref 3.5–5.1)
Sodium: 140 mmol/L (ref 135–145)

## 2018-10-17 LAB — CBC
HCT: 35.7 % — ABNORMAL LOW (ref 36.0–46.0)
Hemoglobin: 10.9 g/dL — ABNORMAL LOW (ref 12.0–15.0)
MCH: 28.2 pg (ref 26.0–34.0)
MCHC: 30.5 g/dL (ref 30.0–36.0)
MCV: 92.2 fL (ref 80.0–100.0)
Platelets: 213 10*3/uL (ref 150–400)
RBC: 3.87 MIL/uL (ref 3.87–5.11)
RDW: 16.2 % — ABNORMAL HIGH (ref 11.5–15.5)
WBC: 7.1 10*3/uL (ref 4.0–10.5)
nRBC: 0 % (ref 0.0–0.2)

## 2018-10-17 LAB — I-STAT TROPONIN, ED: Troponin i, poc: 0 ng/mL (ref 0.00–0.08)

## 2018-10-17 NOTE — ED Triage Notes (Signed)
Onset today chest pain, shortness of breath.  No chest pain at this time.

## 2018-10-18 ENCOUNTER — Other Ambulatory Visit: Payer: Self-pay | Admitting: Cardiovascular Disease

## 2018-10-18 DIAGNOSIS — I5022 Chronic systolic (congestive) heart failure: Secondary | ICD-10-CM | POA: Diagnosis not present

## 2018-10-18 DIAGNOSIS — I10 Essential (primary) hypertension: Secondary | ICD-10-CM | POA: Diagnosis not present

## 2018-10-18 LAB — BRAIN NATRIURETIC PEPTIDE: B Natriuretic Peptide: 72.3 pg/mL (ref 0.0–100.0)

## 2018-10-18 LAB — I-STAT TROPONIN, ED: Troponin i, poc: 0.01 ng/mL (ref 0.00–0.08)

## 2018-10-18 MED ORDER — SACUBITRIL-VALSARTAN 97-103 MG PO TABS: 1.0000 | ORAL_TABLET | Freq: Two times a day (BID) | ORAL | 0 refills | Status: DC

## 2018-10-18 MED ORDER — SACUBITRIL-VALSARTAN 97-103 MG PO TABS: 1.0000 | ORAL_TABLET | Freq: Two times a day (BID) | ORAL | Status: DC

## 2018-10-18 NOTE — ED Notes (Signed)
Consulting cardiologist asked this RN to ensure patient had new prescription for Entresto 97-103 BID, made aware this RN not able to put order in.  Will follow up

## 2018-10-18 NOTE — ED Provider Notes (Signed)
3 5 PM patient resting comfortably.  No distress.  She is been seen by Dr.Nahser emergency department.Entresto dosage increased to 97-1 03 twice daily.  A 1 month supply prescription written by me.  She is instructed to follow-up at heart failure clinic.   Orlie Dakin, MD 10/18/18 1258

## 2018-10-18 NOTE — ED Notes (Signed)
Re-paged cardiology for follow-up, POC, and possible admission orders.

## 2018-10-18 NOTE — Progress Notes (Unsigned)
Increasing dose of Entresto To 97-103 mg PO BID

## 2018-10-18 NOTE — ED Notes (Signed)
Patient able to ambulate independently  

## 2018-10-18 NOTE — ED Notes (Signed)
Patient refusing IV team to stick patient without using ultrasound device.  Another IV team RN will return at a later time to address IV needs.

## 2018-10-18 NOTE — ED Notes (Signed)
EDP at bedside and updating patient and discussing d/c

## 2018-10-18 NOTE — Discharge Instructions (Addendum)
Call the heart failure clinic tomorrow to schedule the next available appointment.  Take Entresto 97-1 03 as directed.  You can stop the other dosage of Entresto as soon as you get the new prescription which was phoned in to your pharmacy today

## 2018-10-18 NOTE — Consult Note (Addendum)
Cardiology Consultation:   Patient ID: Christine Mcdowell; [id number]; 08/30/1971   Admit date: 10/17/2018 Date of Consult: 10/18/2018  Primary Care Provider: [name], MD Primary Cardiologist: Dr. Pierre Bali, MD  Patient Profile:   Christine Mcdowell is a 47 y.o. 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 Tamera Punt.  History of Present Illness:   Ms. Melling is a 47 year old female with a history stated above who presented to [facility] on 10/17/2018 with complaints of chest pain and palpitations with associated headache which began yesterday morning upon waking on 10/17/2018.  She reports a second episode yesterday afternoon while driving to [facility] that was longer in duration with associated diaphoresis and mild shortness of breath.  She describes the chest pain as sharp with radiation down her left arm which subsided on its own.  She reports that she takes her blood pressure daily and noticed that yesterday all throughout the day her blood pressure was systolically in the 262M to 180s which is abnormal for her.  She states that her blood pressures normally run in the 355H to 741U systolically.  She reports intermittent bilateral lower extremity leg swelling, however this is a chronic issue. She reports prior atypical chest pain symptoms in the past.  Upon interview, she continues to have sharp "twinges" of left sided chest pain without associated symptoms. Symptoms are not exacerbated with exertion or deep inspiration. She denies palpitations, orthopnea, PND, fatigue, recent illness including fever, dizziness, presyncopal or syncopal episodes. She is compliant with her medications and reports no diet indiscretions. She states that she has  not felt back to baseline after her Delene Loll was stopped per Pulmonary Medicine and then subsequently restarted per HF team.  She denies tobacco, alcohol or illicit drug use.  She is followed by Dr. Haroldine Laws in the heart failure clinic secondary to severe LV dysfunction in the setting of lupus.  Of note, she was last seen by her primary cardiologist on 10/09/18 for HF follow-up. Delene Loll was initially discontinued per pulmonary medicine 07/2018 secondary to ongoing cough>>which has now been resumed at the 49/51 mg twice daily dose.  She was noted to have been struggling with recurrent bronchitis and URI symptoms.  Had finished fourth round of ABX overall, she was feeling poorly during that office visit.  She continued to have shortness of breath with ADLs.  Compliant with CPAP.  Chronic complaints of orthopnea.  Underwent a cardiopulmonary exercise test 09/03/2018 with no change from previous study per note review.  Last echocardiogram from 11/2017 with further decrease in LV function to 20 to 25% with diffuse hypokinesis with subsequent AICD placement on 01/28/2018.  She underwent a LHC on 10/11/2016 with normal coronaries with recommendations for continued medical therapy.   In the ED, EKG with NSR and no evidence of acute ischemic changes, similar to prior tracings.  I-STAT troponin negative at 0.00, 0.01.  BNP normal at 72.3.  CXR with no active cardiopulmonary disease.  Continues to have intermittent chest discomfort therefore, cardiology has been asked to evaluate.   Past Medical History:  Diagnosis Date  . AICD (automatic cardioverter/defibrillator) present 01/28/2018  . Anemia   . Anginal pain ([place])   . Asthma   . Cervical cancer ([place])   . CHF (congestive heart failure) ([place])   .  Diabetes mellitus without complication ([place])    steroid induced  . Discoid lupus   . Fibromyalgia   . History of blood transfusion "several"   "related to anemia; had some w/hysterectomy also"  . Hx of  cardiovascular stress test    ETT-Myoview (9/15):  No ischemia, EF 52%; NORMAL  . Hx of echocardiogram    Echo (9/15):  EF 50-55%, ant HK, Gr 1 DD, mild MR, mild LAE, no effusion  . Hypertension   . Iron deficiency anemia    h/o iron transfusions  . Lupus (systemic lupus erythematosus) ([place])   . Migraine    "a few/year" (07/03/2016)  . Pneumonia 12/2015  . RA (rheumatoid arthritis) ([place])    "all over" (07/03/2016)  . Sickle cell trait ([place])   . Stroke ([place]) 2014 X 1; 2015 X 2; 2016 X 1;    "right side of face more relaxed than the other; rare speech hesitation" (07/03/2016)  . Vaginal Pap smear, abnormal    ASCUS; HPV    Past Surgical History:  Procedure Laterality Date  . ABDOMINAL HYSTERECTOMY  2009  . ABDOMINAL WOUND DEHISCENCE  2009  . CARDIAC CATHETERIZATION N/A 10/11/2016   Procedure: Right/Left Heart Cath and Coronary Angiography;  Surgeon: Jolaine Artist, MD;  Location: [facility];  Service: Cardiovascular;  Laterality: N/A;  . DILATION AND CURETTAGE OF UTERUS  1991  . HEMATOMA EVACUATION  2009   abdomen  . ICD IMPLANT  01/28/2018  . ICD IMPLANT N/A 01/28/2018   Procedure: ICD IMPLANT;  Surgeon: Evans Lance, MD;  Location: [place] [facility];  Service: Cardiovascular;  Laterality: N/A;  . INCISE AND DRAIN ABCESS  2009 X 2   "abdomen after hysterectomy"  . KNEE ARTHROSCOPY Right 1997  . KNEE SURGERY Right   . TUBAL LIGATION  1996     Prior to Admission medications   Medication Sig Start Date End Date Taking? Authorizing Provider  ACCU-CHEK FASTCLIX LANCETS MISC USE TO PRICK FINGER TO TEST BLOOD SUGAR BEFORE BREAKFAST AND DINNER 08/31/18   [provider]  albuterol (ACCUNEB) 1.25 MG/3ML nebulizer solution Take 3 mLs (1.25 mg total) by nebulization every 6 (six) hours as needed for wheezing. 03/04/16   Lacretia Leigh, MD  albuterol (PROVENTIL HFA;VENTOLIN HFA) 108 (90 Base) MCG/ACT inhaler Inhale 2 puffs into the lungs daily as needed for shortness of  breath. 10/01/18   Fenton Foy, NP  aspirin 81 MG chewable tablet Chew 162 mg by mouth every morning.     [provider]  benzonatate (TESSALON) 100 MG capsule Take 1 capsule (100 mg total) by mouth 3 (three) times daily. 10/01/18   Fenton Foy, NP  carvedilol (COREG) 25 MG tablet TAKE 1 TABLET(25 MG) BY MOUTH TWICE DAILY WITH A MEAL 08/18/18   Bensimhon, Shaune Pascal, MD  cromolyn (OPTICROM) 4 % ophthalmic solution Place 1 drop into both eyes daily. 09/30/18   [provider]  diclofenac sodium (VOLTAREN) 1 % GEL Apply topically 2 (two) times daily. Apply to joints 09/24/18   [provider]  folic acid (FOLVITE) 1 MG tablet TAKE 1 TABLET BY MOUTH EVERY MORNING 01/30/17   Bensimhon, Shaune Pascal, MD  hydrALAZINE (APRESOLINE) 50 MG tablet Take 2 tablets (100 mg total) by mouth 3 (three) times daily. 08/18/18   Bensimhon, Shaune Pascal, MD  hydroxychloroquine (PLAQUENIL) 200 MG tablet Take 1 tablet (200 mg total) by mouth 2 (two) times daily. 08/18/18   Bensimhon, Shaune Pascal, MD  insulin aspart (NOVOLOG) 100 UNIT/ML injection Inject 5-10 Units into the skin See admin instructions. Only if blood sugar is above 150; on sliding scale    [provider]  ipratropium (ATROVENT) 0.03 % nasal spray Place 2 sprays into both nostrils every 6 (six) hours as needed for rhinitis. 10/09/18   Bensimhon, Shaune Pascal, MD  meloxicam (MOBIC) 15 MG tablet Take 15 mg by mouth daily.     [provider]  metFORMIN (GLUCOPHAGE) 500 MG tablet Take 500 mg by mouth daily with breakfast.    [provider]  methotrexate (RHEUMATREX) 2.5 MG tablet Take 20 mg by mouth once a week. On Friday 02/06/15   [provider]  metolazone (ZAROXOLYN) 2.5 MG tablet TAKE 1 TABLET BY MOUTH AS NEEDED FOR WEIGHT GAIN OF 5 POUNDS WITHIN 3 DAYS AS DIRECTED 08/18/18   Bensimhon, Shaune Pascal, MD  metroNIDAZOLE (METROGEL) 0.75 % vaginal gel USE VAGINALLY EVERY NIGHT FOR 5 NIGHTS 10/14/18   Florian Buff,  MD  Multiple Vitamin (MULTIVITAMIN WITH MINERALS) TABS Take 1 tablet by mouth every morning.     [provider]  ondansetron (ZOFRAN) 4 MG tablet Take 1 tablet (4 mg total) by mouth every 8 (eight) hours as needed for nausea or vomiting. 12/30/15   Robbie Lis, MD  pantoprazole (PROTONIX) 40 MG tablet TAKE 1 TABLET(40 MG) BY MOUTH DAILY 30 TO 60 MINUTES BEFORE FIRST MEAL OF THE DAY 08/31/18   Tanda Rockers, MD  potassium chloride (K-DUR) 10 MEQ tablet Take 40 mEq by mouth daily as needed (with metolazone).    [provider]  potassium chloride SA (KLOR-CON M20) 20 MEQ tablet Take 2 tablets by mouth daily as needed. With Metolazone 08/15/14   [provider]  predniSONE (DELTASONE) 5 MG tablet Take 17.5 mg by mouth daily with breakfast.    [provider]  sacubitril-valsartan (ENTRESTO) 49-51 MG Take 1 tablet by mouth 2 (two) times daily. 10/09/18   Bensimhon, Shaune Pascal, MD  Sarilumab [facility]) 150 MG/1.14ML SOAJ Inject into the skin every 14 (fourteen) days.    [provider]  sertraline (ZOLOFT) 50 MG tablet 25 mg daily for one week, then 50 mg daily 10/01/18   Norman Clay, MD  spironolactone (ALDACTONE) 25 MG tablet Take 1 tablet (25 mg total) by mouth 2 (two) times daily. 08/18/18   Bensimhon, Shaune Pascal, MD  torsemide (DEMADEX) 20 MG tablet Take 3 tablets (60 mg total) by mouth 2 (two) times daily. 08/18/18   Bensimhon, Shaune Pascal, MD  traZODone (DESYREL) 50 MG tablet Take 1 tablet by mouth at bedtime. 07/24/17   [provider]  TRESIBA FLEXTOUCH 100 UNIT/ML SOPN FlexTouch Pen Inject 10 Units into the skin every evening.  07/05/17   [provider]  Vitamin D, Ergocalciferol, (DRISDOL) 50000 units CAPS capsule TAKE 1 CAPSULE BY MOUTH TWICE WEEKLY(ON TUESDAY AND FRIDAY) 09/28/18   [name], FNP    Inpatient Medications: Scheduled Meds:  Continuous Infusions:  PRN Meds:   Allergies:    Allergies  Allergen Reactions  .  Hydromorphone Nausea And Vomiting    Other reaction(s): GI Upset (intolerance), Hypertension (intolerance) Raises blood pressure  Other reaction(s): GI Upset (intolerance), Hypertension (intolerance) Raises blood pressure to stroke level  . Iodinated Diagnostic Agents Other (See Comments)    Shuts down kidneys Shuts kidney function down  . Other Other (See Comments) and Anaphylaxis    Spicy foods and seasonings Skin Prep "makes my skin peel off"  Paper tape causes skin burns  . Erythromycin Nausea And Vomiting  . Latex Hives  . Tape Other (See Comments)    "Skin burns"  . Mircette [Desogestrel-Ethinyl Estradiol] Nausea And Vomiting and Rash    Social History:   Social History   Socioeconomic History  . Marital status: Divorced    Spouse name: Not on file  . Number of children: Not on file  . Years of education: Not on file  . Highest education level: Not on file  Occupational History  . Not on file  Social Needs  . Financial resource strain: Not on file  . Food insecurity:    Worry: Not on file    Inability: Not on file  . Transportation needs:    Medical: Not on file    Non-medical: Not on file  Tobacco Use  . Smoking status: Never Smoker  . Smokeless tobacco: Never Used  Substance and Sexual Activity  . Alcohol use: No  . Drug use: No  . Sexual activity: Not Currently    Birth control/protection: Surgical    Comment: hyst  Lifestyle  . Physical activity:    Days per week: Not on file    Minutes per session: Not on file  . Stress: Not on file  Relationships  . Social connections:    Talks on phone: Not on file    Gets together: Not on file    Attends religious service: Not on file    Active member of club or organization: Not on file    Attends meetings of clubs or organizations: Not on file    Relationship status: Not on file  . Intimate partner violence:    Fear of current or ex partner: Not on file    Emotionally abused: Not on file    Physically  abused: Not on file    Forced sexual activity: Not on file  Other Topics Concern  . Not on file  Social History Narrative  . Not on file    Family History:   Family History  Problem Relation Age of Onset  . Arthritis Mother   . Heart murmur Mother   . Drug abuse Mother   . Allergies Mother   . Heart attack Father   . Cushing syndrome Father   . Depression Father   . Allergies Father   . Dementia Paternal Grandmother   . Cancer Paternal Grandfather   . Diabetes Maternal Grandmother   . Hypertension Maternal Grandmother   . Asthma Maternal Grandmother   . Heart attack Maternal Grandfather    Family Status:  Family Status  Relation Name Status  . Mother  Alive  . Father  Alive  . PGM  Deceased  . PGF  Deceased  . MGM  Deceased  . MGF  Deceased  . Daughter  Alive  . Son  Alive  . Son  Alive  . Son  Alive    ROS:  Please see the history of present illness.  All other ROS reviewed and negative.     Physical Exam/Data:   Vitals:   10/18/18 0645 10/18/18 0700 10/18/18 0715 10/18/18 0719  BP: (!) 153/95 (!) 141/94 135/82 135/82  Pulse: 62 81 64 64  Resp: 14 12 13 13   Temp:      TempSrc:      SpO2: 96% 98% 97% 97%   No intake or output data in the 24 hours ending 10/18/18 0800 There were no vitals filed for this visit. There is  no height or weight on file to calculate BMI.   General: Overweight, well developed, well nourished, NAD Skin: Warm, dry, intact  Head: Normocephalic, atraumatic, clear, moist mucus membranes. Neck: Negative for carotid bruits. No JVD Lungs:Clear to ausculation bilaterally. No wheezes, rales, or rhonchi. Breathing is unlabored. Cardiovascular: RRR with S1 S2. No murmurs, rubs, gallops, or LV heave appreciated. Abdomen: Soft, non-tender, non-distended with normoactive bowel sounds. No obvious abdominal masses. MSK: Strength and tone appear normal for age. 5/5 in all extremities Extremities: Mild 1+ BLE edema. No clubbing or cyanosis.  DP/PT pulses 2+ bilaterally Neuro: Alert and oriented. No focal deficits. No facial asymmetry. MAE spontaneously. Psych: Responds to questions appropriately with normal affect.     EKG:  The EKG was personally reviewed and demonstrates: 10/18/2018 NSR with no acute ischemic changes, similar to prior tracings Telemetry:  Telemetry was personally reviewed and demonstrates: 10/18/2018 NSR  Relevant CV Studies:  ECHO 11/2017 --->EF 25-30% RV ok.   CPX 10/2016 Peak VO2: 18.5 (82% predicted peak VO2) VE/VCO2 slope: 30 OUES: 1.75 Peak RER: 1.14.  RHC/LHC 10/11/2016  RA = 4 RV = 24/5 PA = 23/4 (16) PCW = 9 Fick cardiac output/index = 4.4/2.2 SVR = 1580 PVR = 1.5 WU Ao sat = 95% PA sat = 58%, 62% Assessment: 1. Normal coronary arteries with separate ostial for LAD and LCX 2. Normal right heart pressures 3. NICM with EF ~20% by echo  Laboratory Data:  Chemistry Recent Labs  Lab 10/17/18 2311  NA 140  K 4.0  CL 104  CO2 28  GLUCOSE 178*  BUN 15  CREATININE 1.10*  CALCIUM 9.0  GFRNONAA 58*  GFRAA >60  ANIONGAP 8    Total Protein  Date Value Ref Range Status  11/18/2017 5.7 (L) 6.5 - 8.1 g/dL Final   Albumin  Date Value Ref Range Status  11/18/2017 3.3 (L) 3.5 - 5.0 g/dL Final   AST  Date Value Ref Range Status  11/18/2017 24 15 - 41 U/L Final   ALT  Date Value Ref Range Status  11/18/2017 15 14 - 54 U/L Final   Alkaline Phosphatase  Date Value Ref Range Status  11/18/2017 47 38 - 126 U/L Final   Total Bilirubin  Date Value Ref Range Status  11/18/2017 0.7 0.3 - 1.2 mg/dL Final   Hematology Recent Labs  Lab 10/17/18 2311  WBC 7.1  RBC 3.87  HGB 10.9*  HCT 35.7*  MCV 92.2  MCH 28.2  MCHC 30.5  RDW 16.2*  PLT 213   Cardiac EnzymesNo results for input(s): TROPONINI in the last 168 hours.  Recent Labs  Lab 10/17/18 2319 10/18/18 0600  TROPIPOC 0.00 0.01    BNP Recent Labs  Lab 10/17/18 2311  BNP 72.3    DDimer No results for  input(s): DDIMER in the last 168 hours. TSH:  Lab Results  Component Value Date   TSH 0.929 10/09/2016   Lipids:No results found for: CHOL, HDL, LDLCALC, LDLDIRECT, TRIG, CHOLHDL HgbA1c: Lab Results  Component Value Date   HGBA1C 6.0 (H) 10/10/2016    Radiology/Studies:  Dg Chest 2 View  Result Date: 10/18/2018 CLINICAL DATA:  47 year old female with chest pain. EXAM: CHEST - 2 VIEW COMPARISON:  Chest radiograph dated 10/01/2018 FINDINGS: The lungs are clear. There is no pleural effusion or pneumothorax. Stable mild cardiomegaly. Left pectoral AICD device. No acute osseous pathology. IMPRESSION: No active cardiopulmonary disease. Electronically Signed   By: Anner Crete M.D.   On: 10/18/2018 00:35  Assessment and Plan:   1.  Atypical chest pain with negative troponin: -Patient presented with c/o chest pain which began yesterday morning upon wakening.  She reported one episode in the morning lasting approximately 1 to 2 hours in duration which subsided on its own.  Later that afternoon, she states she had another episode of left-sided, sharp chest discomfort while riding in the car to [facility] rated an 8 out of 10 in severity with associated diaphoresis, nausea and left-sided radiation.  Her pain subsided once again on its own.  She reports she takes her blood pressure daily and noted it to be markedly elevated all throughout the day yesterday.  SBP's in the 170s to 180s which is abnormal for her.  She reports no diet indiscretions and complete medication compliance.  Her elevated BP was her biggest concern and reason for ED evaluation.  -Last RHC/LHC 2017 with normal coronary arteries, all by Dr. Haroldine Laws for severe systolic dysfunction -Continues to have intermittent, sharp left-sided chest pain -Will consider further ischemic work-up with coronary CT versus repeat cardiac cath given ongoing pain. Will discuss plan with MD -EKG with NSR and no acute ischemic changes -Troponin,  negative x2  -Continue carvedilol, ASA  2. Chronic systolic heart failure with NICM: -Last cardiac catheterization 09/2016 with normal coronary arteries -Last echocardiogram 11/2017 with reduced LVEF to 25 to 30% s/p [employer] Heart[employer] ICD 01/2018 -Cardiac MRI in 02/2015 showed EF 46%. No LGE. CPX with very mild HF limitation and no high-risk features -Continue torsemide 60 mg twice daily, Aldactone 25 mg twice daily, carvedilol 6.25 mg twice daily, Entresto 49/51 twice daily  -Consented for CardioMEMS>> followed OP at HF clinic -Continue carvedilol, hydralazine, torsemide, Entresto, Aldactone, PRN Zaroxolyn -Strict I&O, daily weights -Weight, from 10/01/2018 191lb -I&O, no output documented -Appears to be euvolemic upon exam  3. Sinus tachycardia versus atrial flutter per ICD: -Stable, follows Dr. Lovena Le  4. Lupus: -Stable, no recent flares, on chronic prednisone and methotrexate per PCP  5. HTN: -Stable, currently 133/92>156/97>135/82>141/94 however patient reports that BP's were markedly elevated for her normal yesterday with SBP's in the 170's-180's  -Initial thought is that her CP was associated with her elevated BP in the setting of severe systolic dysfunction and demand ischemia, however she continues to have intermittent chest pain with controlled BP -Continue carvedilol, hydralazine, Aldactone, Entresto  6. OSA: -Compliant with CPAP   For questions or updates, please contact [place] Please consult [email or web address] for contact info under Cardiology/STEMI.   Lyndel Safe NP-C [facility] Pager: [phone] 10/18/2018 8:00 AM   Attending Note:   The patient was seen and examined.  Agree with assessment and plan as noted above.  Changes made to the above note as needed.  Patient seen and independently examined with  Kathyrn Drown, NP .   We discussed all aspects of the encounter. I agree with the assessment and plan as stated above.  1.  Chest  discomfort: Patient presents with chest discomfort.  The pain is rather atypical.  She has chest wall tenderness. Chest x-ray is unremarkable.  Her B natruretic peptide level is normal. Normal heart cath several years ago.  Troponin levels are negative x2.  I think that she can be safely discharged from the emergency room.  Help with the advanced heart failure clinic.  2.  Chronic systolic congestive heart failure: The patient seems to be euvolemic.  Her B natruretic peptide is normal.  Her ankle edema is well controlled.  I do not  think that she needs any additional Lasix at this time.  I do not think that she needs to be admitted.  3.  Hypertension: Blood pressure remains elevated.  We will increase the Entresto up to 97-103 mg.  The nurse in the emergency room will be sending that into [facility] on [address] in [place]. Follow-up with the heart failure clinic.   I have spent a total of 40 minutes with patient reviewing hospital  notes , telemetry, EKGs, labs and examining patient as well as establishing an assessment and plan that was discussed with the patient. > 50% of time was spent in direct patient care.    Thayer Headings, Brooke Bonito., MD, [facility] 10/18/2018, 12:37 PM [address]  [place] Pager [phone]

## 2018-10-18 NOTE — ED Provider Notes (Signed)
[facility] EMERGENCY DEPARTMENT Provider Note   CSN: [id number] Arrival date & time: 10/17/18  2257     History   Chief Complaint Chief Complaint  Patient presents with  . Chest Pain  . Shortness of Breath    HPI Christine Mcdowell is a 47 y.o. female.  Patient is a 47 year old female with a history of lupus and myocarditis/cardiomyopathy, diabetes, fiber myalgia, hyperlipidemia and prior stroke who presents with chest pain.  She states she woke up this morning with some chest pain and palpitations.  She had associated headache.  She noticed that her blood pressures been high all day.  This evening she had an episode for about an hour she where she was profusely diaphoretic.  During that time she developed sharp pain to her chest that radiated down her left arm.  This subsided but then she had some tightness across her chest.  She has little shortness of breath.  She states her chest pain has currently resolved.  She has had some increased leg swelling yesterday but took her diuretic and it has improved today.  No pleuritic type chest pain.  No unilateral leg swelling.  No fevers.  No cough or cold symptoms.     Past Medical History:  Diagnosis Date  . AICD (automatic cardioverter/defibrillator) present 01/28/2018  . Anemia   . Anginal pain ([place])   . Asthma   . Cervical cancer ([place])   . CHF (congestive heart failure) ([place])   . Diabetes mellitus without complication ([place])    steroid induced  . Discoid lupus   . Fibromyalgia   . History of blood transfusion "several"   "related to anemia; had some w/hysterectomy also"  . Hx of cardiovascular stress test    ETT-Myoview (9/15):  No ischemia, EF 52%; NORMAL  . Hx of echocardiogram    Echo (9/15):  EF 50-55%, ant HK, Gr 1 DD, mild MR, mild LAE, no effusion  . Hypertension   . Iron deficiency anemia    h/o iron transfusions  . Lupus (systemic lupus erythematosus) ([place])   . Migraine    "a few/year"  (07/03/2016)  . Pneumonia 12/2015  . RA (rheumatoid arthritis) ([place])    "all over" (07/03/2016)  . Sickle cell trait ([place])   . Stroke ([place]) 2014 X 1; 2015 X 2; 2016 X 1;    "right side of face more relaxed than the other; rare speech hesitation" (07/03/2016)  . Vaginal Pap smear, abnormal    ASCUS; HPV    Patient Active Problem List   Diagnosis Date Noted  . VT (ventricular tachycardia) ([place]) 05/20/2018  . Chronic systolic (congestive) heart failure ([place]) 01/28/2018  . Upper airway cough syndrome 01/19/2018  . Pulmonary infiltrates on CXR 01/19/2018  . Migraines 10/08/2017  . Sleep apnea with use of continuous positive airway pressure (CPAP) 09/24/2017  . Congestive heart failure (CHF) ([place]) 10/09/2016  . Respiratory distress 10/09/2016  . Rheumatoid arthritis ([place]) 10/09/2016  . Asthma 10/09/2016  . Chronic pain 10/09/2016  . Heart failure ([place]) 10/09/2016  . Congestive heart failure ([place])   . Chest pain 07/03/2016  . Thrombocytopenia ([place]) 07/03/2016  . Lupus (systemic lupus erythematosus) ([place])   . Diabetes mellitus with complication ([place])   . Anxiety state   . GERD (gastroesophageal reflux disease) 12/28/2015  . Essential hypertension 12/26/2015  . Type 2 diabetes mellitus ([place]) 12/26/2015  . Chronic systolic heart failure ([place]) 08/18/2014  . 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 Hypokalemia 01/27/2014  .  Dyspnea on exertion 01/26/2014  . SLE (systemic lupus erythematosus) ([place]) 01/26/2014    Past Surgical History:  Procedure Laterality Date  . ABDOMINAL HYSTERECTOMY  2009  . ABDOMINAL WOUND DEHISCENCE  2009  . CARDIAC CATHETERIZATION N/A 10/11/2016   Procedure: Right/Left Heart Cath and Coronary Angiography;  Surgeon: Jolaine Artist, MD;  Location: [facility];  Service: Cardiovascular;  Laterality: N/A;  . DILATION AND CURETTAGE OF UTERUS  1991  . HEMATOMA EVACUATION  2009   abdomen  . ICD IMPLANT  01/28/2018  . ICD IMPLANT N/A 01/28/2018    Procedure: ICD IMPLANT;  Surgeon: Evans Lance, MD;  Location: [facility];  Service: Cardiovascular;  Laterality: N/A;  . INCISE AND DRAIN ABCESS  2009 X 2   "abdomen after hysterectomy"  . KNEE ARTHROSCOPY Right 1997  . KNEE SURGERY Right   . TUBAL LIGATION  1996     OB History    Gravida  5   Para  4   Term  3   Preterm  1   AB  1   Living  4     SAB  1   TAB      Ectopic      Multiple      Live Births  4            Home Medications    Prior to Admission medications   Medication Sig Start Date End Date Taking? Authorizing Provider  ACCU-CHEK FASTCLIX LANCETS MISC USE TO PRICK FINGER TO TEST BLOOD SUGAR BEFORE BREAKFAST AND DINNER 08/31/18   [provider]  albuterol (ACCUNEB) 1.25 MG/3ML nebulizer solution Take 3 mLs (1.25 mg total) by nebulization every 6 (six) hours as needed for wheezing. 03/04/16   Lacretia Leigh, MD  albuterol (PROVENTIL HFA;VENTOLIN HFA) 108 (90 Base) MCG/ACT inhaler Inhale 2 puffs into the lungs daily as needed for shortness of breath. 10/01/18   Fenton Foy, NP  aspirin 81 MG chewable tablet Chew 162 mg by mouth every morning.     [provider]  benzonatate (TESSALON) 100 MG capsule Take 1 capsule (100 mg total) by mouth 3 (three) times daily. 10/01/18   Fenton Foy, NP  carvedilol (COREG) 25 MG tablet TAKE 1 TABLET(25 MG) BY MOUTH TWICE DAILY WITH A MEAL 08/18/18   Bensimhon, Shaune Pascal, MD  cromolyn (OPTICROM) 4 % ophthalmic solution Place 1 drop into both eyes daily. 09/30/18   [provider]  diclofenac sodium (VOLTAREN) 1 % GEL Apply topically 2 (two) times daily. Apply to joints 09/24/18   [provider]  folic acid (FOLVITE) 1 MG tablet TAKE 1 TABLET BY MOUTH EVERY MORNING 01/30/17   Bensimhon, Shaune Pascal, MD  hydrALAZINE (APRESOLINE) 50 MG tablet Take 2 tablets (100 mg total) by mouth 3 (three) times daily. 08/18/18   Bensimhon, Shaune Pascal, MD  hydroxychloroquine (PLAQUENIL) 200 MG  tablet Take 1 tablet (200 mg total) by mouth 2 (two) times daily. 08/18/18   Bensimhon, Shaune Pascal, MD  insulin aspart (NOVOLOG) 100 UNIT/ML injection Inject 5-10 Units into the skin See admin instructions. Only if blood sugar is above 150; on sliding scale    [provider]  ipratropium (ATROVENT) 0.03 % nasal spray Place 2 sprays into both nostrils every 6 (six) hours as needed for rhinitis. 10/09/18   Bensimhon, Shaune Pascal, MD  meloxicam (MOBIC) 15 MG tablet Take 15 mg by mouth daily.     [provider]  metFORMIN (  GLUCOPHAGE) 500 MG tablet Take 500 mg by mouth daily with breakfast.    [provider]  methotrexate (RHEUMATREX) 2.5 MG tablet Take 20 mg by mouth once a week. On Friday 02/06/15   [provider]  metolazone (ZAROXOLYN) 2.5 MG tablet TAKE 1 TABLET BY MOUTH AS NEEDED FOR WEIGHT GAIN OF 5 POUNDS WITHIN 3 DAYS AS DIRECTED 08/18/18   Bensimhon, Shaune Pascal, MD  metroNIDAZOLE (METROGEL) 0.75 % vaginal gel USE VAGINALLY EVERY NIGHT FOR 5 NIGHTS 10/14/18   Florian Buff, MD  Multiple Vitamin (MULTIVITAMIN WITH MINERALS) TABS Take 1 tablet by mouth every morning.     [provider]  ondansetron (ZOFRAN) 4 MG tablet Take 1 tablet (4 mg total) by mouth every 8 (eight) hours as needed for nausea or vomiting. 12/30/15   Robbie Lis, MD  pantoprazole (PROTONIX) 40 MG tablet TAKE 1 TABLET(40 MG) BY MOUTH DAILY 30 TO 60 MINUTES BEFORE FIRST MEAL OF THE DAY 08/31/18   Tanda Rockers, MD  potassium chloride (K-DUR) 10 MEQ tablet Take 40 mEq by mouth daily as needed (with metolazone).    [provider]  potassium chloride SA (KLOR-CON M20) 20 MEQ tablet Take 2 tablets by mouth daily as needed. With Metolazone 08/15/14   [provider]  predniSONE (DELTASONE) 5 MG tablet Take 17.5 mg by mouth daily with breakfast.    [provider]  sacubitril-valsartan (ENTRESTO) 49-51 MG Take 1 tablet by mouth 2 (two) times daily. 10/09/18   Bensimhon,  Shaune Pascal, MD  Sarilumab [facility]) 150 MG/1.14ML SOAJ Inject into the skin every 14 (fourteen) days.    [provider]  sertraline (ZOLOFT) 50 MG tablet 25 mg daily for one week, then 50 mg daily 10/01/18   Norman Clay, MD  spironolactone (ALDACTONE) 25 MG tablet Take 1 tablet (25 mg total) by mouth 2 (two) times daily. 08/18/18   Bensimhon, Shaune Pascal, MD  torsemide (DEMADEX) 20 MG tablet Take 3 tablets (60 mg total) by mouth 2 (two) times daily. 08/18/18   Bensimhon, Shaune Pascal, MD  traZODone (DESYREL) 50 MG tablet Take 1 tablet by mouth at bedtime. 07/24/17   [provider]  TRESIBA FLEXTOUCH 100 UNIT/ML SOPN FlexTouch Pen Inject 10 Units into the skin every evening.  07/05/17   [provider]  Vitamin D, Ergocalciferol, (DRISDOL) 50000 units CAPS capsule TAKE 1 CAPSULE BY MOUTH TWICE WEEKLY(ON TUESDAY AND FRIDAY) 09/28/18   [name], FNP    Family History Family History  Problem Relation Age of Onset  . Arthritis Mother   . Heart murmur Mother   . Drug abuse Mother   . Allergies Mother   . Heart attack Father   . Cushing syndrome Father   . Depression Father   . Allergies Father   . Dementia Paternal Grandmother   . Cancer Paternal Grandfather   . Diabetes Maternal Grandmother   . Hypertension Maternal Grandmother   . Asthma Maternal Grandmother   . Heart attack Maternal Grandfather     Social History Social History   Tobacco Use  . Smoking status: Never Smoker  . Smokeless tobacco: Never Used  Substance Use Topics  . Alcohol use: No  . Drug use: No     Allergies   Hydromorphone; Iodinated diagnostic agents; Other; Erythromycin; Latex; Tape; and Mircette [desogestrel-ethinyl estradiol]   Review of Systems Review of Systems  Constitutional: Positive for diaphoresis and fatigue. Negative for chills and fever.  HENT: Negative for congestion, rhinorrhea and  sneezing.   Eyes: Negative.   Respiratory: Positive for chest tightness and shortness  of breath. Negative for cough.   Cardiovascular: Positive for chest pain and leg swelling.  Gastrointestinal: Negative for abdominal pain, blood in stool, diarrhea, nausea and vomiting.  Genitourinary: Negative for difficulty urinating, flank pain, frequency and hematuria.  Musculoskeletal: Negative for arthralgias and back pain.  Skin: Negative for rash.  Neurological: Negative for dizziness, speech difficulty, weakness, numbness and headaches.     Physical Exam Updated Vital Signs BP (!) 144/97   Pulse 61   Temp 98.5 F (36.9 C) (Oral)   Resp 13   SpO2 97%   Physical Exam  Constitutional: She is oriented to person, place, and time. She appears well-developed and well-nourished.  HENT:  Head: Normocephalic and atraumatic.  Eyes: Pupils are equal, round, and reactive to light.  Neck: Normal range of motion. Neck supple.  Cardiovascular: Normal rate, regular rhythm and normal heart sounds.  Pulmonary/Chest: Effort normal and breath sounds normal. No respiratory distress. She has no wheezes. She has no rales. She exhibits no tenderness.  Abdominal: Soft. Bowel sounds are normal. There is no tenderness. There is no rebound and no guarding.  Musculoskeletal: Normal range of motion. She exhibits no edema.  Trace edema to lower extremities bilaterally  Lymphadenopathy:    She has no cervical adenopathy.  Neurological: She is alert and oriented to person, place, and time.  Skin: Skin is warm and dry. No rash noted.  Psychiatric: She has a normal mood and affect.     ED Treatments / Results  Labs (all labs ordered are listed, but only abnormal results are displayed) Labs Reviewed  BASIC METABOLIC PANEL - Abnormal; Notable for the following components:      Result Value   Glucose, Bld 178 (*)    Creatinine, Ser 1.10 (*)    GFR calc non Af Amer 58 (*)    All other components within normal limits  CBC - Abnormal; Notable for the following components:   Hemoglobin 10.9 (*)     HCT 35.7 (*)    RDW 16.2 (*)    All other components within normal limits  BRAIN NATRIURETIC PEPTIDE  I-STAT TROPONIN, ED  I-STAT TROPONIN, ED    EKG EKG Interpretation  Date/Time:  Saturday October 17 2018 23:06:09 EDT Ventricular Rate:  81 PR Interval:  174 QRS Duration: 82 QT Interval:  408 QTC Calculation: 473 R Axis:   -29 Text Interpretation:  Normal sinus rhythm Normal ECG since last tracing no significant change Confirmed by Malvin Johns [phone]) on 10/18/2018 4:47:40 AM   Radiology Dg Chest 2 View  Result Date: 10/18/2018 CLINICAL DATA:  47 year old female with chest pain. EXAM: CHEST - 2 VIEW COMPARISON:  Chest radiograph dated 10/01/2018 FINDINGS: The lungs are clear. There is no pleural effusion or pneumothorax. Stable mild cardiomegaly. Left pectoral AICD device. No acute osseous pathology. IMPRESSION: No active cardiopulmonary disease. Electronically Signed   By: Anner Crete M.D.   On: 10/18/2018 00:35    Procedures Procedures (including critical care time)  Medications Ordered in ED Medications - No data to display   Initial Impression / Assessment and Plan / ED Course  I have reviewed the triage vital signs and the nursing notes.  Pertinent labs & imaging results that were available during my care of the patient were reviewed by me and considered in my medical decision making (see chart for details).     Patient is a 47 year old female with  multiple medical [place] who presents with chest pain associated shortness of breath and diaphoresis.  There is no ischemic changes on EKG.  Her first troponin is negative.  I spoke with Dr. Emilio Aspen, the cardiology fellow on-call who advises that either he or the oncoming cardiologist will come and see the patient.  Final Clinical Impressions(s) / ED Diagnoses   Final diagnoses:  Chest pain, unspecified type    ED Discharge Orders    None       Malvin Johns, MD 10/18/18 [phone]

## 2018-10-20 ENCOUNTER — Telehealth (HOSPITAL_COMMUNITY): Payer: Self-pay | Admitting: *Deleted

## 2018-10-20 ENCOUNTER — Ambulatory Visit: Payer: Self-pay | Admitting: Pulmonary Disease

## 2018-10-20 NOTE — Telephone Encounter (Signed)
Med records faxed to disability per pt signed medical records release form.  

## 2018-10-21 ENCOUNTER — Other Ambulatory Visit (HOSPITAL_COMMUNITY): Payer: Self-pay

## 2018-10-21 IMAGING — CR DG FOOT COMPLETE 3+V*L*
3 series · 3 of 3 positions shown · non-contrast
Comparison: None.

CLINICAL DATA: Left foot injury status post blunt trauma.

EXAM:
LEFT FOOT - COMPLETE 3+ VIEW

[x foot ap left]
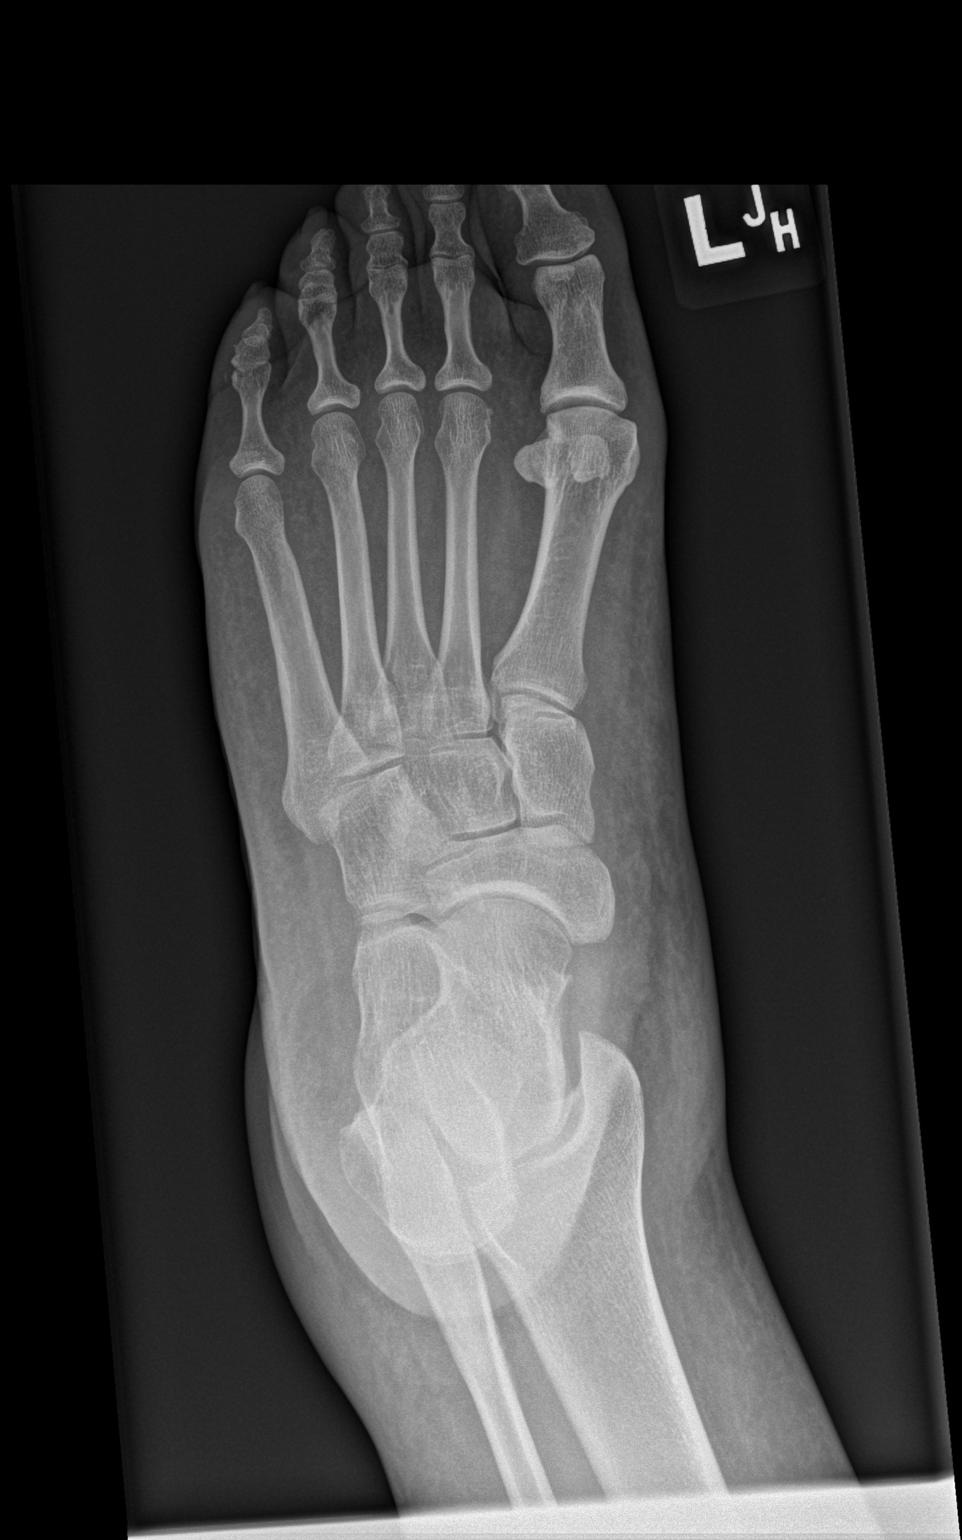

[x foot obl left]
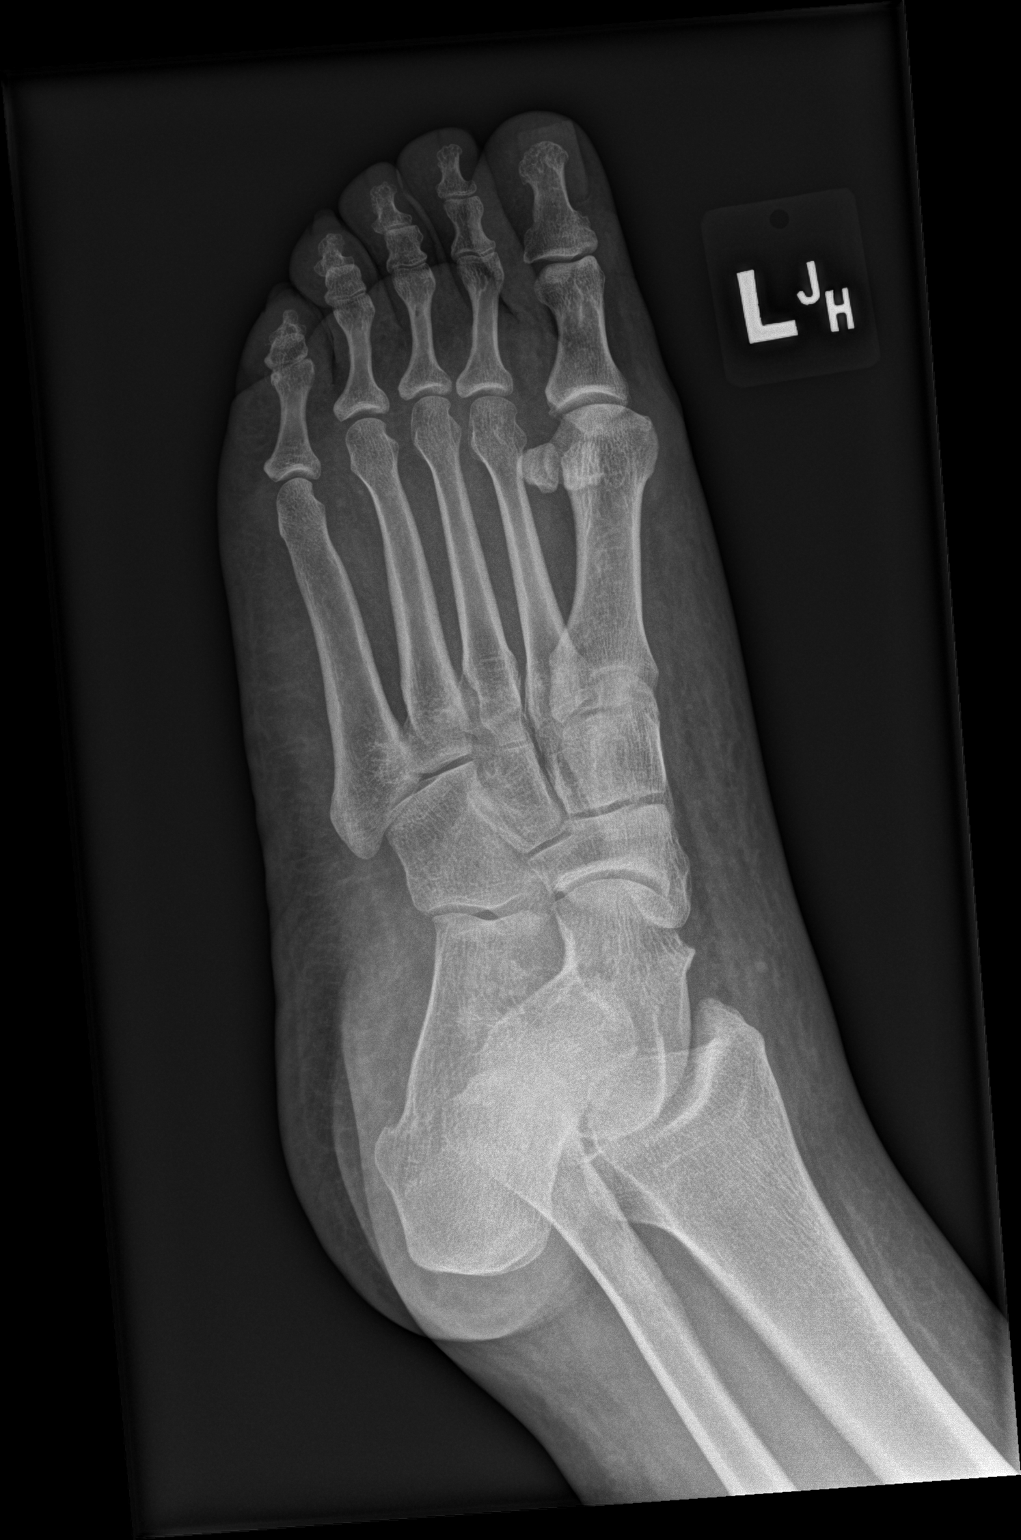

[x foot lat left]
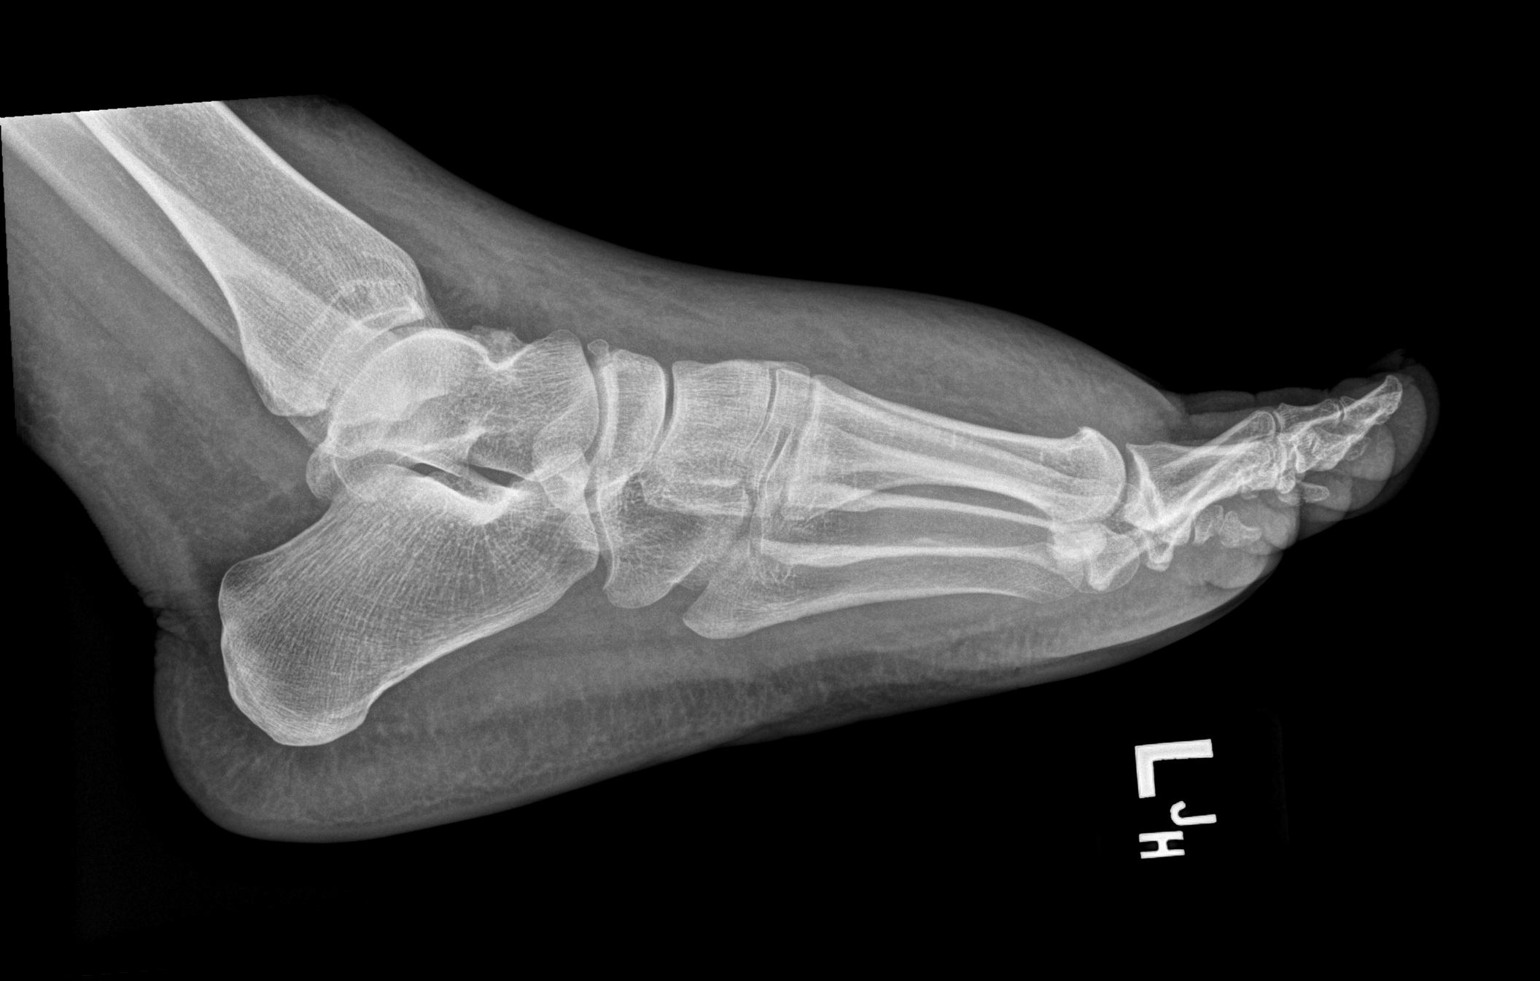

[3 of 3 positions shown; findings below may reference images not displayed]

FINDINGS: The left foot demonstrates no fracture or dislocation. There is mild
osteoarthritis of the first MTP joint. There is soft tissue swelling
along the dorsal aspect of the foot. There is no subcutaneous
emphysema or radiopaque foreign bodies.
IMPRESSION: 1. No acute osseous injury of the left foot. Severe soft tissue
swelling along the dorsal aspect of the left foot consistent with
contusion.

## 2018-10-21 MED ORDER — SPIRONOLACTONE 25 MG PO TABS: 25.0000 mg | ORAL_TABLET | Freq: Two times a day (BID) | ORAL | 3 refills | Status: DC

## 2018-10-22 DIAGNOSIS — H2512 Age-related nuclear cataract, left eye: Secondary | ICD-10-CM | POA: Diagnosis not present

## 2018-10-23 DIAGNOSIS — H25011 Cortical age-related cataract, right eye: Secondary | ICD-10-CM | POA: Diagnosis not present

## 2018-10-27 ENCOUNTER — Encounter: Payer: Self-pay | Admitting: Internal Medicine

## 2018-10-27 ENCOUNTER — Encounter (HOSPITAL_COMMUNITY): Payer: Self-pay

## 2018-10-27 ENCOUNTER — Encounter: Payer: Self-pay | Admitting: Nurse Practitioner

## 2018-10-28 ENCOUNTER — Encounter (HOSPITAL_COMMUNITY): Payer: Self-pay

## 2018-10-28 ENCOUNTER — Other Ambulatory Visit: Payer: Self-pay | Admitting: Nurse Practitioner

## 2018-10-28 DIAGNOSIS — M25561 Pain in right knee: Secondary | ICD-10-CM | POA: Diagnosis not present

## 2018-10-28 DIAGNOSIS — Z9889 Other specified postprocedural states: Secondary | ICD-10-CM | POA: Diagnosis not present

## 2018-10-28 DIAGNOSIS — E119 Type 2 diabetes mellitus without complications: Secondary | ICD-10-CM | POA: Diagnosis not present

## 2018-10-28 DIAGNOSIS — Z794 Long term (current) use of insulin: Secondary | ICD-10-CM | POA: Diagnosis not present

## 2018-10-28 DIAGNOSIS — M1711 Unilateral primary osteoarthritis, right knee: Secondary | ICD-10-CM | POA: Diagnosis not present

## 2018-10-28 DIAGNOSIS — Z79899 Other long term (current) drug therapy: Secondary | ICD-10-CM | POA: Diagnosis not present

## 2018-10-28 DIAGNOSIS — D509 Iron deficiency anemia, unspecified: Secondary | ICD-10-CM | POA: Diagnosis not present

## 2018-10-28 DIAGNOSIS — G8929 Other chronic pain: Secondary | ICD-10-CM | POA: Diagnosis not present
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
## 2018-10-29 ENCOUNTER — Ambulatory Visit (INDEPENDENT_AMBULATORY_CARE_PROVIDER_SITE_OTHER): Payer: BLUE CROSS/BLUE SHIELD

## 2018-10-29 ENCOUNTER — Other Ambulatory Visit: Payer: Self-pay | Admitting: Nurse Practitioner

## 2018-10-29 DIAGNOSIS — Z95810 Presence of automatic (implantable) cardiac defibrillator: Secondary | ICD-10-CM

## 2018-10-29 DIAGNOSIS — I5022 Chronic systolic (congestive) heart failure: Secondary | ICD-10-CM

## 2018-10-29 DIAGNOSIS — M1711 Unilateral primary osteoarthritis, right knee: Secondary | ICD-10-CM | POA: Insufficient documentation

## 2018-10-30 ENCOUNTER — Telehealth: Payer: Self-pay

## 2018-10-30 NOTE — Telephone Encounter (Signed)
Remote ICM transmission received.  Attempted call to patient regarding ICM remote transmission and no answer.  

## 2018-10-30 NOTE — Progress Notes (Signed)
EPIC Encounter for ICM Monitoring  Patient Name: Christine Mcdowell is a 47 y.o. female Date: 10/30/2018 Primary Care Physican: [name], MD Primary Cardiologist:Bensimhon Electrophysiologist:Taylor Last weight 198lbs        Attempted call to patient and unable to reach.   Transmission reviewed.    [employer] Heart Failure Index is 2.  Index has not crossed the threshold suggesting normal.  Prescribed: Torsemide20 mg take3tablets (60 mg total) twice a day (per Dr Clayborne Dana 10/09/2018 office note, take extra as needed).Metolazone 2.5 mg takeone tablet as needed for weight gain of 5 lbs within 3 days.   Labs: 10/17/2018 Creatinine 1.10, BUN 15, Potassium 4.0, Sodium 140, eGFR 58->60 08/18/2018 Creatinine 1.65, BUN 19, Potassium 3.1, Sodium 142, EGFR 36-41 05/20/2018 Creatinine 1.03, BUN 14, Potassium 3.9, Sodium 140, EGFR >60  Recommendations: Unable to reach.  Follow-up plan: ICM clinic phone appointment on 12/01/2018.          Copy of ICM check sent to Dr. Lovena Le.    3 Month Trend    8 Day Data Trend            [name], RN 10/30/2018 11:58 AM

## 2018-11-02 ENCOUNTER — Ambulatory Visit (HOSPITAL_COMMUNITY): Payer: BLUE CROSS/BLUE SHIELD | Admitting: Psychiatry
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
## 2018-11-05 ENCOUNTER — Ambulatory Visit: Payer: BLUE CROSS/BLUE SHIELD | Admitting: Nurse Practitioner

## 2018-11-09 DIAGNOSIS — R062 Wheezing: Secondary | ICD-10-CM | POA: Diagnosis not present

## 2018-11-11 ENCOUNTER — Ambulatory Visit (HOSPITAL_COMMUNITY): Payer: Self-pay | Admitting: Psychiatry

## 2018-11-11 ENCOUNTER — Ambulatory Visit: Payer: BLUE CROSS/BLUE SHIELD | Admitting: Nurse Practitioner

## 2018-11-12 DIAGNOSIS — H2511 Age-related nuclear cataract, right eye: Secondary | ICD-10-CM | POA: Diagnosis not present

## 2018-11-16 ENCOUNTER — Other Ambulatory Visit: Payer: Self-pay | Admitting: Nurse Practitioner

## 2018-11-25 ENCOUNTER — Telehealth (HOSPITAL_COMMUNITY): Payer: Self-pay | Admitting: Psychiatry

## 2018-11-25 MED ORDER — SERTRALINE HCL 50 MG PO TABS: 50.0000 mg | ORAL_TABLET | Freq: Every day | ORAL | 0 refills | Status: DC

## 2018-11-25 NOTE — Telephone Encounter (Signed)
Ordered refill for sertraline per request. Please contact the patient to make follow up appointment, and notify of no show policy. Will not plan to prescribe any refill without evaluation.

## 2018-11-25 NOTE — Telephone Encounter (Signed)
Voice Message stating the caller is Unavailable try your call Later

## 2018-11-26 ENCOUNTER — Telehealth: Payer: Self-pay | Admitting: *Deleted

## 2018-11-26 NOTE — Telephone Encounter (Signed)
Requesting refill for metronidazole 0.75% vaginal gel.

## 2018-11-27 ENCOUNTER — Telehealth: Payer: Self-pay

## 2018-11-27 ENCOUNTER — Telehealth: Payer: Self-pay | Admitting: Obstetrics & Gynecology

## 2018-11-27 MED ORDER — METRONIDAZOLE 0.75 % VA GEL: VAGINAL | 0 refills | Status: DC

## 2018-11-27 NOTE — Telephone Encounter (Signed)
Meds ordered this encounter  Medications  . metroNIDAZOLE (METROGEL VAGINAL) 0.75 % vaginal gel    Sig: Nightly x 5 nights    Dispense:  70 g    Refill:  0    

## 2018-11-27 NOTE — Telephone Encounter (Signed)
done

## 2018-11-27 NOTE — Telephone Encounter (Signed)
Not able to get a hold of [name] by phone to talk about the PREP at the [facility]

## 2018-12-01 ENCOUNTER — Ambulatory Visit (INDEPENDENT_AMBULATORY_CARE_PROVIDER_SITE_OTHER): Payer: BLUE CROSS/BLUE SHIELD

## 2018-12-01 ENCOUNTER — Telehealth: Payer: Self-pay

## 2018-12-01 DIAGNOSIS — Z95810 Presence of automatic (implantable) cardiac defibrillator: Secondary | ICD-10-CM

## 2018-12-01 DIAGNOSIS — I5022 Chronic systolic (congestive) heart failure: Secondary | ICD-10-CM

## 2018-12-01 DIAGNOSIS — I420 Dilated cardiomyopathy: Secondary | ICD-10-CM

## 2018-12-01 NOTE — Progress Notes (Signed)
EPIC Encounter for ICM Monitoring  Patient Name: Christine Mcdowell is a 47 y.o. female Date: 12/01/2018 Primary Care Physican: Sanders, Robyn, MD Primary Cardiologist:Bensimhon Electrophysiologist:Taylor Last weight198lbs                                                                                      Attempted call to patient and unable to reach.   Transmission reviewed.    HeartLogic Heart Failure Index is 2.  Index has not crossed the threshold suggesting normal.  Prescribed: Torsemide20 mg take3tablets (60 mg total) twice a day (per Dr Bensimhon's 10/09/2018 office note, take extra as needed).Metolazone 2.5 mg takeone tablet as needed for weight gain of 5 lbs within 3 days.   Labs: 10/17/2018 Creatinine 1.10, BUN 15, Potassium 4.0, Sodium 140, eGFR 58->60 08/18/2018 Creatinine 1.65, BUN 19, Potassium 3.1, Sodium 142, EGFR 36-41 05/20/2018 Creatinine 1.03, BUN 14, Potassium 3.9, Sodium 140, EGFR >60  Recommendations: Unable to reach.  Follow-up plan: ICM clinic phone appointment on 01/07/2019.          Copy of ICM check sent to Dr. Taylor.   3 Month Trend    8 Day Data Trend           Laurie S Short, RN 12/01/2018 8:11 AM    

## 2018-12-01 NOTE — Telephone Encounter (Signed)
Remote ICM transmission received.  Attempted call to patient regarding ICM remote transmission and no answer or answering machine. 

## 2018-12-03 NOTE — Progress Notes (Signed)
Remote ICD transmission.   

## 2018-12-07 ENCOUNTER — Ambulatory Visit (HOSPITAL_COMMUNITY): Payer: BLUE CROSS/BLUE SHIELD | Admitting: Licensed Clinical Social Worker

## 2018-12-10 ENCOUNTER — Encounter (HOSPITAL_COMMUNITY): Payer: Self-pay

## 2018-12-13 ENCOUNTER — Other Ambulatory Visit: Payer: Self-pay | Admitting: Internal Medicine

## 2018-12-18 ENCOUNTER — Other Ambulatory Visit: Payer: Self-pay | Admitting: Internal Medicine

## 2018-12-24 ENCOUNTER — Telehealth: Payer: Self-pay | Admitting: Internal Medicine

## 2018-12-24 DIAGNOSIS — M23341 Other meniscus derangements, anterior horn of lateral meniscus, right knee: Secondary | ICD-10-CM | POA: Diagnosis not present

## 2018-12-24 DIAGNOSIS — M1711 Unilateral primary osteoarthritis, right knee: Secondary | ICD-10-CM | POA: Diagnosis not present

## 2018-12-24 DIAGNOSIS — M7121 Synovial cyst of popliteal space [Baker], right knee: Secondary | ICD-10-CM | POA: Diagnosis not present

## 2018-12-24 DIAGNOSIS — M25461 Effusion, right knee: Secondary | ICD-10-CM | POA: Diagnosis not present

## 2018-12-24 DIAGNOSIS — Z9889 Other specified postprocedural states: Secondary | ICD-10-CM | POA: Diagnosis not present

## 2018-12-24 DIAGNOSIS — M948X6 Other specified disorders of cartilage, lower leg: Secondary | ICD-10-CM | POA: Diagnosis not present

## 2018-12-24 DIAGNOSIS — Z950 Presence of cardiac pacemaker: Secondary | ICD-10-CM | POA: Diagnosis not present

## 2018-12-24 NOTE — Telephone Encounter (Signed)
Pt calling today bc she is concerned with her heart rate. She states she had a MRI today and the device technician manipulated her device and told her she would have a rapid heart rate, but it would eventually go back to normal. Pt states that was a few hours ago and her HR still feels rapid. She could not tell me what her heart rate was measuring and she was not close to her monitor to send in a transmission. I advised pt to use a pulse ox or manually count her pulse for a full minute when she gets the opportunity. If her HR is sustained >130 bpm, she needs to go to the ED to have her HR and device settings checked. Pt will send in a remote check when she returns home. She has verbalized understanding and had no additional questions.

## 2018-12-24 NOTE — Telephone Encounter (Signed)
New message      1. Has your device fired? no  2. Is you device beeping? no  3. Are you experiencing draining or swelling at device site? no  4. Are you calling to see if we received your device transmission? No dr. Royal [place] office wants device checked due to fast heart rate   5. Have you passed out? no    Please route to [place]

## 2018-12-25 ENCOUNTER — Other Ambulatory Visit (HOSPITAL_COMMUNITY): Payer: Self-pay | Admitting: Internal Medicine

## 2018-12-25 NOTE — Telephone Encounter (Signed)
Transmission from 12/25/18 at 0452 reviewed. Normal device function. Presenting rhythm shows Vs at 62bpm. "MRI Protection Mode" noted beginning at 1246 on 12/24/18, duration 1hr 32min. No ventricular arrhythmia episodes noted yesterday. Lead trends stable. 15 "VT" monitor-zone episodes and 8 non-sustained episodes since 05/20/18, most recent on 12/22/18--morphology similar to presenting.  Spoke with patient to make her aware. She feels that her HR has normalized, has not noted any other issues. She verbalizes appreciation of call.

## 2018-12-26 ENCOUNTER — Other Ambulatory Visit: Payer: Self-pay | Admitting: Nurse Practitioner

## 2018-12-28 ENCOUNTER — Other Ambulatory Visit (HOSPITAL_COMMUNITY): Payer: Self-pay | Admitting: Psychiatry

## 2018-12-28 ENCOUNTER — Ambulatory Visit: Payer: Self-pay | Admitting: Nurse Practitioner

## 2018-12-28 MED ORDER — SERTRALINE HCL 50 MG PO TABS: 50.0000 mg | ORAL_TABLET | Freq: Every day | ORAL | 0 refills | Status: DC

## 2018-12-30 DIAGNOSIS — Z881 Allergy status to other antibiotic agents status: Secondary | ICD-10-CM | POA: Diagnosis not present

## 2018-12-30 DIAGNOSIS — R21 Rash and other nonspecific skin eruption: Secondary | ICD-10-CM | POA: Diagnosis not present

## 2018-12-30 DIAGNOSIS — Z91040 Latex allergy status: Secondary | ICD-10-CM | POA: Diagnosis not present

## 2018-12-30 DIAGNOSIS — M25541 Pain in joints of right hand: Secondary | ICD-10-CM | POA: Diagnosis not present

## 2018-12-30 DIAGNOSIS — I7300 Raynaud's syndrome without gangrene: Secondary | ICD-10-CM | POA: Diagnosis not present

## 2018-12-30 DIAGNOSIS — Z7983 Long term (current) use of bisphosphonates: Secondary | ICD-10-CM | POA: Diagnosis not present

## 2018-12-30 DIAGNOSIS — Z79899 Other long term (current) drug therapy: Secondary | ICD-10-CM | POA: Diagnosis not present

## 2018-12-30 DIAGNOSIS — M358 Other specified systemic involvement of connective tissue: Secondary | ICD-10-CM | POA: Diagnosis not present

## 2018-12-30 DIAGNOSIS — M059 Rheumatoid arthritis with rheumatoid factor, unspecified: Secondary | ICD-10-CM | POA: Diagnosis not present

## 2018-12-30 DIAGNOSIS — T148XXA Other injury of unspecified body region, initial encounter: Secondary | ICD-10-CM | POA: Diagnosis not present

## 2018-12-30 DIAGNOSIS — Z885 Allergy status to narcotic agent status: Secondary | ICD-10-CM | POA: Diagnosis not present

## 2018-12-30 DIAGNOSIS — M2550 Pain in unspecified joint: Secondary | ICD-10-CM | POA: Diagnosis not present

## 2018-12-30 DIAGNOSIS — Z7952 Long term (current) use of systemic steroids: Secondary | ICD-10-CM | POA: Diagnosis not present

## 2018-12-30 DIAGNOSIS — M329 Systemic lupus erythematosus, unspecified: Secondary | ICD-10-CM | POA: Diagnosis not present

## 2018-12-30 DIAGNOSIS — M25542 Pain in joints of left hand: Secondary | ICD-10-CM | POA: Diagnosis not present

## 2018-12-30 DIAGNOSIS — M19041 Primary osteoarthritis, right hand: Secondary | ICD-10-CM | POA: Diagnosis not present

## 2018-12-30 DIAGNOSIS — M19042 Primary osteoarthritis, left hand: Secondary | ICD-10-CM | POA: Diagnosis not present

## 2019-01-07 ENCOUNTER — Ambulatory Visit (INDEPENDENT_AMBULATORY_CARE_PROVIDER_SITE_OTHER): Payer: BLUE CROSS/BLUE SHIELD

## 2019-01-07 DIAGNOSIS — I5022 Chronic systolic (congestive) heart failure: Secondary | ICD-10-CM

## 2019-01-07 DIAGNOSIS — Z95810 Presence of automatic (implantable) cardiac defibrillator: Secondary | ICD-10-CM

## 2019-01-08 ENCOUNTER — Telehealth: Payer: Self-pay

## 2019-01-08 NOTE — Telephone Encounter (Signed)
Remote ICM transmission received.  Attempted call to patient regarding ICM remote transmission and mobile number is disconnected and home phone number line busy.

## 2019-01-08 NOTE — Progress Notes (Signed)
EPIC Encounter for ICM Monitoring  Patient Name: Christine Mcdowell is a 48 y.o. female Date: 01/08/2019 Primary Care Physican: [name], MD Primary Cardiologist:Bensimhon Electrophysiologist:Taylor Lastweight198lbs        Attempted call to patient.   Transmission reviewed.    HeartLogic Heart Failure Index is 0 suggesting normal.  Prescribed: Torsemide20 mg take3tablets (60 mg total) twice a day(per Dr Clayborne Dana 10/09/2018 office note, take extra as needed).Metolazone 2.5 mg takeone tablet as needed for weight gain of 5 lbs within 3 days.   Labs: 10/17/2018 Creatinine 1.10, BUN 15, Potassium 4.0, Sodium 140, eGFR 58->60 08/18/2018 Creatinine 1.65, BUN 19, Potassium 3.1, Sodium 142, EGFR 36-41 05/20/2018 Creatinine 1.03, BUN 14, Potassium 3.9, Sodium 140, EGFR >60  Recommendations: Unable to reach.  Follow-up plan: ICM clinic phone appointment on 02/08/2019.  Office appointment scheduled 01/11/2019 with Dr. Haroldine Laws.        Copy of ICM check sent to Dr. Lovena Le.             [name], RN 01/08/2019 8:10 AM

## 2019-01-11 ENCOUNTER — Inpatient Hospital Stay (HOSPITAL_COMMUNITY): Admission: RE | Admit: 2019-01-11 | Payer: Self-pay | Source: Ambulatory Visit | Admitting: Internal Medicine

## 2019-01-14 DIAGNOSIS — M21161 Varus deformity, not elsewhere classified, right knee: Secondary | ICD-10-CM | POA: Diagnosis not present

## 2019-01-14 DIAGNOSIS — Z01812 Encounter for preprocedural laboratory examination: Secondary | ICD-10-CM | POA: Diagnosis not present

## 2019-01-14 DIAGNOSIS — E119 Type 2 diabetes mellitus without complications: Secondary | ICD-10-CM | POA: Diagnosis not present

## 2019-01-14 DIAGNOSIS — Z9889 Other specified postprocedural states: Secondary | ICD-10-CM | POA: Diagnosis not present

## 2019-01-14 DIAGNOSIS — M1711 Unilateral primary osteoarthritis, right knee: Secondary | ICD-10-CM | POA: Diagnosis not present

## 2019-01-16 LAB — CUP PACEART REMOTE DEVICE CHECK
Date Time Interrogation Session: 20200125143753
Implantable Lead Implant Date: 20190206
Implantable Lead Location: 753860
Implantable Lead Model: 292
Implantable Lead Serial Number: 438194
Implantable Pulse Generator Implant Date: 20190206
Pulse Gen Serial Number: 243572

## 2019-02-02 ENCOUNTER — Encounter: Payer: Self-pay | Admitting: Internal Medicine

## 2019-02-02 ENCOUNTER — Other Ambulatory Visit: Payer: Self-pay

## 2019-02-02 ENCOUNTER — Other Ambulatory Visit (HOSPITAL_COMMUNITY): Payer: Self-pay

## 2019-02-02 ENCOUNTER — Encounter (HOSPITAL_COMMUNITY): Payer: Self-pay

## 2019-02-02 MED ORDER — HYDRALAZINE HCL 50 MG PO TABS: 100.0000 mg | ORAL_TABLET | Freq: Three times a day (TID) | ORAL | 0 refills | Status: DC

## 2019-02-02 MED ORDER — CARVEDILOL 25 MG PO TABS: ORAL_TABLET | ORAL | 0 refills | Status: DC

## 2019-02-02 MED ORDER — CITALOPRAM HYDROBROMIDE 10 MG PO TABS: 10.0000 mg | ORAL_TABLET | Freq: Every day | ORAL | 0 refills | Status: DC

## 2019-02-02 MED ORDER — SACUBITRIL-VALSARTAN 49-51 MG PO TABS: 1.0000 | ORAL_TABLET | Freq: Two times a day (BID) | ORAL | 0 refills | Status: DC

## 2019-02-02 MED ORDER — METFORMIN HCL 500 MG PO TABS: ORAL_TABLET | ORAL | 0 refills | Status: DC

## 2019-02-02 MED ORDER — HYDROXYCHLOROQUINE SULFATE 200 MG PO TABS: 200.0000 mg | ORAL_TABLET | Freq: Two times a day (BID) | ORAL | 0 refills | Status: AC

## 2019-02-02 MED ORDER — TORSEMIDE 20 MG PO TABS: 20.0000 mg | ORAL_TABLET | Freq: Every day | ORAL | 0 refills | Status: DC

## 2019-02-02 MED ORDER — SPIRONOLACTONE 25 MG PO TABS: 25.0000 mg | ORAL_TABLET | Freq: Two times a day (BID) | ORAL | 0 refills | Status: DC

## 2019-02-03 ENCOUNTER — Encounter (HOSPITAL_COMMUNITY): Payer: Self-pay

## 2019-02-08 ENCOUNTER — Ambulatory Visit (INDEPENDENT_AMBULATORY_CARE_PROVIDER_SITE_OTHER): Payer: BLUE CROSS/BLUE SHIELD

## 2019-02-08 DIAGNOSIS — I5022 Chronic systolic (congestive) heart failure: Secondary | ICD-10-CM

## 2019-02-08 DIAGNOSIS — Z95810 Presence of automatic (implantable) cardiac defibrillator: Secondary | ICD-10-CM | POA: Diagnosis not present

## 2019-02-09 ENCOUNTER — Telehealth: Payer: Self-pay

## 2019-02-09 DIAGNOSIS — K219 Gastro-esophageal reflux disease without esophagitis: Secondary | ICD-10-CM | POA: Diagnosis not present

## 2019-02-09 DIAGNOSIS — Z7982 Long term (current) use of aspirin: Secondary | ICD-10-CM | POA: Diagnosis not present

## 2019-02-09 DIAGNOSIS — Z538 Procedure and treatment not carried out for other reasons: Secondary | ICD-10-CM | POA: Diagnosis not present

## 2019-02-09 DIAGNOSIS — I509 Heart failure, unspecified: Secondary | ICD-10-CM | POA: Diagnosis not present

## 2019-02-09 DIAGNOSIS — Z79899 Other long term (current) drug therapy: Secondary | ICD-10-CM | POA: Diagnosis not present

## 2019-02-09 DIAGNOSIS — F329 Major depressive disorder, single episode, unspecified: Secondary | ICD-10-CM | POA: Diagnosis not present

## 2019-02-09 DIAGNOSIS — G4733 Obstructive sleep apnea (adult) (pediatric): Secondary | ICD-10-CM | POA: Diagnosis not present

## 2019-02-09 DIAGNOSIS — E119 Type 2 diabetes mellitus without complications: Secondary | ICD-10-CM | POA: Diagnosis not present

## 2019-02-09 DIAGNOSIS — M1711 Unilateral primary osteoarthritis, right knee: Secondary | ICD-10-CM | POA: Diagnosis not present

## 2019-02-09 DIAGNOSIS — I110 Hypertensive heart disease with heart failure: Secondary | ICD-10-CM | POA: Diagnosis not present

## 2019-02-09 DIAGNOSIS — Z7952 Long term (current) use of systemic steroids: Secondary | ICD-10-CM | POA: Diagnosis not present

## 2019-02-09 DIAGNOSIS — M329 Systemic lupus erythematosus, unspecified: Secondary | ICD-10-CM | POA: Diagnosis not present

## 2019-02-09 DIAGNOSIS — Z794 Long term (current) use of insulin: Secondary | ICD-10-CM | POA: Diagnosis not present

## 2019-02-09 DIAGNOSIS — J45909 Unspecified asthma, uncomplicated: Secondary | ICD-10-CM | POA: Diagnosis not present

## 2019-02-09 DIAGNOSIS — I420 Dilated cardiomyopathy: Secondary | ICD-10-CM | POA: Diagnosis not present

## 2019-02-09 NOTE — Telephone Encounter (Signed)
Returned patient call to her daughters phone number [phone] as requested by voice mail message.   Patient is currently in preop holding area waiting on total knee surgery that was scheduled for today.  Anesthesia canceled surgery because the pre op didn't obtain cardiac clearance.  She is very upset because she has been waiting a long time to have the knee replaced because of the severe pain.  She said anesthesia said patient needs to be seen by HF clinic in order to reschedule surgery.  Advised would send phone message to Dr Bensimhon's office requesting that patient have an appointment asap so total knee surgery can be rescheduled.

## 2019-02-09 NOTE — Progress Notes (Signed)
EPIC Encounter for ICM Monitoring  Patient Name: Christine Mcdowell is a 48 y.o. female Date: 02/09/2019 Primary Care Physican: [name], MD Primary Cardiologist:Bensimhon Electrophysiologist:Taylor [place]                                                                             Transmission reviewed.  Patient having a total knee replacement today, 02/09/2019.   HeartLogic Heart Failure Index is 0 suggesting normal.  Prescribed: Torsemide20 mg take1 tablet (20 mg total) daily and may increase to 60 mg daily for excess fluid.Metolazone 2.5 mg takeone tablet as needed for weight gain of 5 lbs within 3 days.   Labs: 10/17/2018 Creatinine 1.10, BUN 15, Potassium 4.0, Sodium 140, eGFR 58->60 08/18/2018 Creatinine 1.65, BUN 19, Potassium 3.1, Sodium 142, EGFR 36-41 05/20/2018 Creatinine 1.03, BUN 14, Potassium 3.9, Sodium 140, EGFR >60  Recommendations:  None  Follow-up plan: ICM clinic phone appointment on 03/15/2019.  Last office appointment scheduled in Jan with Dr. Haroldine Laws was a no show.        Copy of ICM check sent to Dr. Lovena Le.              [name], RN 02/09/2019 8:57 AM

## 2019-02-10 ENCOUNTER — Telehealth (HOSPITAL_COMMUNITY): Payer: Self-pay | Admitting: *Deleted

## 2019-02-10 DIAGNOSIS — I5022 Chronic systolic (congestive) heart failure: Secondary | ICD-10-CM

## 2019-02-10 NOTE — Telephone Encounter (Signed)
Pt needs surgical clearance for knee surgery, needs echo and appt w/Dr Bensimhon before can be cleared, order placed will arrange

## 2019-02-10 NOTE — Telephone Encounter (Signed)
appt scheduled 2/25. Christine Mcdowell called heart care at [facility] to schedule echo prior to appt.

## 2019-02-10 NOTE — Telephone Encounter (Signed)
Ok. We will schedule

## 2019-02-12 ENCOUNTER — Ambulatory Visit (HOSPITAL_COMMUNITY)
Admission: RE | Admit: 2019-02-12 | Discharge: 2019-02-12 | Disposition: A | Discharge status: Home / Self Care | Payer: BLUE CROSS/BLUE SHIELD | Source: Ambulatory Visit | Attending: Internal Medicine | Admitting: Internal Medicine

## 2019-02-12 DIAGNOSIS — I5022 Chronic systolic (congestive) heart failure: Secondary | ICD-10-CM | POA: Insufficient documentation

## 2019-02-12 NOTE — Progress Notes (Signed)
*  PRELIMINARY RESULTS* Echocardiogram 2D Echocardiogram has been performed.  Leavy Cella 02/12/2019, 2:29 PM

## 2019-02-16 ENCOUNTER — Ambulatory Visit (HOSPITAL_COMMUNITY)
Admission: RE | Admit: 2019-02-16 | Discharge: 2019-02-16 | Disposition: A | Discharge status: Home / Self Care | Payer: BLUE CROSS/BLUE SHIELD | Source: Ambulatory Visit | Attending: Internal Medicine | Admitting: Internal Medicine

## 2019-02-16 ENCOUNTER — Encounter (HOSPITAL_COMMUNITY): Payer: Self-pay | Admitting: Internal Medicine

## 2019-02-16 ENCOUNTER — Ambulatory Visit: Payer: Self-pay | Admitting: Physical Therapy

## 2019-02-16 ENCOUNTER — Other Ambulatory Visit: Payer: Self-pay

## 2019-02-16 VITALS — BP 118/70 | HR 78 | Wt 191.4 lb

## 2019-02-16 DIAGNOSIS — M069 Rheumatoid arthritis, unspecified: Secondary | ICD-10-CM | POA: Insufficient documentation

## 2019-02-16 DIAGNOSIS — Z794 Long term (current) use of insulin: Secondary | ICD-10-CM | POA: Diagnosis not present

## 2019-02-16 DIAGNOSIS — G4733 Obstructive sleep apnea (adult) (pediatric): Secondary | ICD-10-CM | POA: Diagnosis not present

## 2019-02-16 DIAGNOSIS — J45909 Unspecified asthma, uncomplicated: Secondary | ICD-10-CM | POA: Insufficient documentation

## 2019-02-16 DIAGNOSIS — E119 Type 2 diabetes mellitus without complications: Secondary | ICD-10-CM | POA: Insufficient documentation

## 2019-02-16 DIAGNOSIS — Z90710 Acquired absence of both cervix and uterus: Secondary | ICD-10-CM | POA: Diagnosis not present

## 2019-02-16 DIAGNOSIS — M797 Fibromyalgia: Secondary | ICD-10-CM | POA: Diagnosis not present

## 2019-02-16 DIAGNOSIS — I110 Hypertensive heart disease with heart failure: Secondary | ICD-10-CM | POA: Insufficient documentation

## 2019-02-16 DIAGNOSIS — Z792 Long term (current) use of antibiotics: Secondary | ICD-10-CM | POA: Insufficient documentation

## 2019-02-16 DIAGNOSIS — Z79899 Other long term (current) drug therapy: Secondary | ICD-10-CM | POA: Diagnosis not present

## 2019-02-16 DIAGNOSIS — Z791 Long term (current) use of non-steroidal anti-inflammatories (NSAID): Secondary | ICD-10-CM | POA: Insufficient documentation

## 2019-02-16 DIAGNOSIS — I5022 Chronic systolic (congestive) heart failure: Secondary | ICD-10-CM | POA: Diagnosis not present

## 2019-02-16 DIAGNOSIS — D509 Iron deficiency anemia, unspecified: Secondary | ICD-10-CM | POA: Insufficient documentation

## 2019-02-16 DIAGNOSIS — I428 Other cardiomyopathies: Secondary | ICD-10-CM | POA: Diagnosis not present

## 2019-02-16 DIAGNOSIS — G43909 Migraine, unspecified, not intractable, without status migrainosus: Secondary | ICD-10-CM | POA: Diagnosis not present

## 2019-02-16 DIAGNOSIS — Z8541 Personal history of malignant neoplasm of cervix uteri: Secondary | ICD-10-CM | POA: Insufficient documentation

## 2019-02-16 DIAGNOSIS — Z01810 Encounter for preprocedural cardiovascular examination: Secondary | ICD-10-CM | POA: Diagnosis not present

## 2019-02-16 DIAGNOSIS — Z7982 Long term (current) use of aspirin: Secondary | ICD-10-CM | POA: Insufficient documentation

## 2019-02-16 DIAGNOSIS — D688 Other specified coagulation defects: Secondary | ICD-10-CM | POA: Insufficient documentation

## 2019-02-16 DIAGNOSIS — Z7952 Long term (current) use of systemic steroids: Secondary | ICD-10-CM | POA: Diagnosis not present

## 2019-02-16 DIAGNOSIS — M329 Systemic lupus erythematosus, unspecified: Secondary | ICD-10-CM | POA: Diagnosis not present

## 2019-02-16 DIAGNOSIS — I2510 Atherosclerotic heart disease of native coronary artery without angina pectoris: Secondary | ICD-10-CM

## 2019-02-16 DIAGNOSIS — Z95810 Presence of automatic (implantable) cardiac defibrillator: Secondary | ICD-10-CM | POA: Insufficient documentation

## 2019-02-16 LAB — BASIC METABOLIC PANEL
Anion gap: 12 (ref 5–15)
BUN: 10 mg/dL (ref 6–20)
CO2: 23 mmol/L (ref 22–32)
Calcium: 8.9 mg/dL (ref 8.9–10.3)
Chloride: 107 mmol/L (ref 98–111)
Creatinine, Ser: 0.98 mg/dL (ref 0.44–1.00)
GFR calc Af Amer: >60 mL/min (ref >60)
GFR calc non Af Amer: >60 mL/min (ref >60)
Glucose, Bld: 169 mg/dL — ABNORMAL HIGH (ref 70–99)
Potassium: 3.6 mmol/L (ref 3.5–5.1)
Sodium: 142 mmol/L (ref 135–145)

## 2019-02-16 LAB — CBC
HCT: 35.6 % — ABNORMAL LOW (ref 36.0–46.0)
Hemoglobin: 10.4 g/dL — ABNORMAL LOW (ref 12.0–15.0)
MCH: 26.2 pg (ref 26.0–34.0)
MCHC: 29.2 g/dL — ABNORMAL LOW (ref 30.0–36.0)
MCV: 89.7 fL (ref 80.0–100.0)
Platelets: 234 10*3/uL (ref 150–400)
RBC: 3.97 MIL/uL (ref 3.87–5.11)
RDW: 15.1 % (ref 11.5–15.5)
WBC: 10.7 10*3/uL — ABNORMAL HIGH (ref 4.0–10.5)
nRBC: 0 % (ref 0.0–0.2)

## 2019-02-16 LAB — PROTIME-INR
INR: 1.1 (ref 0.8–1.2)
Prothrombin Time: 14 seconds (ref 11.4–15.2)

## 2019-02-16 NOTE — Patient Instructions (Signed)
Labs today We will only contact you if something comes back abnormal or we need to make some changes. Otherwise no news is good news!  Do the following things EVERYDAY: 1) Weigh yourself in the morning before breakfast. Write it down and keep it in a log. 2) Take your medicines as prescribed 3) Eat low salt foods-Limit salt (sodium) to 2000 mg per day.  4) Stay as active as you can everyday 5) Limit all fluids for the day to less than 2 liters  Your physician recommends that you schedule a follow-up appointment in: 4 months

## 2019-02-16 NOTE — Progress Notes (Signed)
Advanced Heart Failure Clinic Note    Primary Cardiologist:  Dr Johnsie Cancel  Rheumatologist: Dr Eliberto Ivory  HF: Dr. Haroldine Laws   HPI: Christine Mcdowell is a 48 y.o. 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   Admitted in October 2017 with increased dyspnea and volume overload. Diuresed with IV lasix and transitioned to lasix 40 mg twice a day. LHC/RHC normal filling pressures, EF ~20%, and normal coronaries. Discharge weight 184 pounds.   Admitted to [facility] in September 2019  for ACL repair. Also received IV lasix during hospital admit.   She presents today for cardiac clearance for R TKA. She had an echo on 02/12/19, which showed EF 40-45%, RV normal. Overall doing fairly well. Still with mild DOE. No CP or significant edema. Says Her BP is low at times and she feels fatigued. No syncope.   Recent CPX 9/19 with relaltively normal spirometry. Mild HF limitation.    Device interrogated: HL score 0 . No VT/AF Fluid looks good. Personally reviewed   Echo 02/12/19 EF 40-45%  Cardiac MRI in 02/2015 showed EF 46%. No LGE.  ECHO 11/2017 --->EF 25-30% RV ok.   CPX 9/19  FVC 2.47 (74%)    FEV1 2.17 (81%)     FEV1/FVC 88 (108%)     MVV 87 (84%)     Resting HR: 88 Peak HR: 139  (81% age predicted max HR) BP rest: 90/58 BP peak: 130/58  Peak VO2: 16.3 (75% predicted peak VO2) - when corrected to ibw 21.0 VE/VCO2 slope: 30 OUES: 1.66 Peak RER: 1.19 Ventilatory Threshold: 13.0 (60% predicted and 80% measured peak VO2) O2pulse: 11  (100% predicted O2pulse)  CPX 10/2016 Peak VO2: 18.5 (82% predicted peak VO2) - - when corrected to ibw 23.5 VE/VCO2 slope: 30 OUES: 1.75 Peak RER: 1.14.  RHC/LHC 10/11/2016  RA = 4 RV = 24/5 PA = 23/4 (16) PCW = 9 Fick cardiac output/index = 4.4/2.2 SVR = 1580 PVR =  1.5 WU Ao sat =  95% PA sat = 58%, 62% Assessment: 1. Normal coronary arteries with separate ostial for LAD and LCX 2. Normal right heart pressures 3. NICM with EF ~20% by echo  Review of systems complete and found to be negative unless listed in HPI.   SH:  Social History   Socioeconomic History  . Marital status: Divorced    Spouse name: Not on file  . Number of children: Not on file  . Years of education: Not on file  . Highest education level: Not on file  Occupational History  . Not on file  Social Needs  . Financial resource strain: Not on file  . Food insecurity:    Worry: Not on file    Inability: Not on file  . Transportation needs:    Medical: Not on file    Non-medical: Not on file  Tobacco Use  . Smoking status: Never Smoker  . Smokeless tobacco: Never Used  Substance and Sexual Activity  . Alcohol use: No  . Drug use: No  . Sexual activity: Not Currently    Birth control/protection: Surgical    Comment: hyst  Lifestyle  . Physical activity:    Days per week: Not on file    Minutes per session: Not on file  . Stress: Not on file  Relationships  . Social connections:    Talks on phone: Not on  file    Gets together: Not on file    Attends religious service: Not on file    Active member of club or organization: Not on file    Attends meetings of clubs or organizations: Not on file    Relationship status: Not on file  . Intimate partner violence:    Fear of current or ex partner: Not on file    Emotionally abused: Not on file    Physically abused: Not on file    Forced sexual activity: Not on file  Other Topics Concern  . Not on file  Social History Narrative  . Not on file   FH:  Family History  Problem Relation Age of Onset  . Arthritis Mother   . Heart murmur Mother   . Drug abuse Mother   . Allergies Mother   . Heart attack Father   . Cushing syndrome Father   . Depression Father   . Allergies Father   . Dementia Paternal Grandmother   . Cancer  Paternal Grandfather   . Diabetes Maternal Grandmother   . Hypertension Maternal Grandmother   . Asthma Maternal Grandmother   . Heart attack Maternal Grandfather    Past Medical History:  Diagnosis Date  . AICD (automatic cardioverter/defibrillator) present 01/28/2018  . Anemia   . Anginal pain ([place])   . Asthma   . Cervical cancer ([place])   . CHF (congestive heart failure) ([place])   . Diabetes mellitus without complication ([place])    steroid induced  . Discoid lupus   . Fibromyalgia   . History of blood transfusion "several"   "related to anemia; had some w/hysterectomy also"  . Hx of cardiovascular stress test    ETT-Myoview (9/15):  No ischemia, EF 52%; NORMAL  . Hx of echocardiogram    Echo (9/15):  EF 50-55%, ant HK, Gr 1 DD, mild MR, mild LAE, no effusion  . Hypertension   . Iron deficiency anemia    h/o iron transfusions  . Lupus (systemic lupus erythematosus) ([place])   . Migraine    "a few/year" (07/03/2016)  . Pneumonia 12/2015  . RA (rheumatoid arthritis) ([place])    "all over" (07/03/2016)  . Sickle cell trait ([place])   . Stroke ([place]) 2014 X 1; 2015 X 2; 2016 X 1;    "right side of face more relaxed than the other; rare speech hesitation" (07/03/2016)  . Vaginal Pap smear, abnormal    ASCUS; HPV    Current Outpatient Medications  Medication Sig Dispense Refill  . ACCU-CHEK FASTCLIX LANCETS MISC USE TO PRICK FINGER TO TEST BLOOD SUGAR BEFORE BREAKFAST AND DINNER  10  . albuterol (ACCUNEB) 1.25 MG/3ML nebulizer solution Take 3 mLs (1.25 mg total) by nebulization every 6 (six) hours as needed for wheezing. 75 mL 12  . albuterol (PROVENTIL HFA;VENTOLIN HFA) 108 (90 Base) MCG/ACT inhaler Inhale 2 puffs into the lungs daily as needed for shortness of breath. 1 Inhaler 0  . aspirin 81 MG chewable tablet Chew 162 mg by mouth every morning.     . benzonatate (TESSALON) 100 MG capsule Take 1 capsule (100 mg total) by mouth 3 (three) times daily. 30 capsule 1  . carvedilol (COREG) 25 MG  tablet TAKE 1 TABLET(25 MG) BY MOUTH TWICE DAILY WITH A MEAL 180 tablet 0  . citalopram (CELEXA) 10 MG tablet Take 1 tablet (10 mg total) by mouth daily. 90 tablet 0  . cromolyn (OPTICROM) 4 % ophthalmic solution Place 1 drop into both eyes  daily.  0  . diclofenac sodium (VOLTAREN) 1 % GEL Apply topically 2 (two) times daily. Apply to joints  2  . folic acid (FOLVITE) 1 MG tablet TAKE 1 TABLET BY MOUTH EVERY MORNING (Patient taking differently: Take 1 mg by mouth daily. ) 30 tablet 0  . hydrALAZINE (APRESOLINE) 50 MG tablet Take 2 tablets (100 mg total) by mouth 3 (three) times daily. 180 tablet 0  . hydroxychloroquine (PLAQUENIL) 200 MG tablet Take 1 tablet (200 mg total) by mouth 2 (two) times daily. 180 tablet 0  . insulin aspart (NOVOLOG) 100 UNIT/ML injection Inject 2-10 Units into the skin See admin instructions. Only if blood sugar is above 150; on sliding scale    . ipratropium (ATROVENT) 0.03 % nasal spray Place 2 sprays into both nostrils every 6 (six) hours as needed for rhinitis. 30 mL 12  . meloxicam (MOBIC) 15 MG tablet Take 15 mg by mouth daily.     . metFORMIN (GLUCOPHAGE) 500 MG tablet TAKE 1 TABLET BY MOUTH EVERY DAY WITH THE MORNING AND EVENING MEAL 90 tablet 0  . methotrexate (RHEUMATREX) 2.5 MG tablet Take 20 mg by mouth every Friday.   3  . metolazone (ZAROXOLYN) 2.5 MG tablet TAKE 1 TABLET BY MOUTH AS NEEDED FOR WEIGHT GAIN OF 5 POUNDS WITHIN 3 DAYS AS DIRECTED 5 tablet 3  . metroNIDAZOLE (METROGEL VAGINAL) 0.75 % vaginal gel Nightly x 5 nights 70 g 0  . Multiple Vitamin (MULTIVITAMIN WITH MINERALS) TABS Take 1 tablet by mouth every morning.     Marland Kitchen NOVOLOG FLEXPEN 100 UNIT/ML FlexPen INJECT PER SLIDING SCALE TWICE DAILY**MAX OF 50 UNITS EVERY DAY 5 pen 0  . ondansetron (ZOFRAN) 4 MG tablet Take 1 tablet (4 mg total) by mouth every 8 (eight) hours as needed for nausea or vomiting. 20 tablet 0  . pantoprazole (PROTONIX) 40 MG tablet TAKE 1 TABLET(40 MG) BY MOUTH DAILY 30 TO 60  MINUTES BEFORE FIRST MEAL OF THE DAY 30 tablet 3  . predniSONE (DELTASONE) 5 MG tablet Take 17.5 mg by mouth daily with breakfast.    . sacubitril-valsartan (ENTRESTO) 49-51 MG Take 1 tablet by mouth 2 (two) times daily. 180 tablet 0  . Sarilumab (KEVZARA) 150 MG/1.14ML SOAJ Inject 150 mg into the skin every 14 (fourteen) days.     Marland Kitchen sertraline (ZOLOFT) 50 MG tablet Take 1 tablet (50 mg total) by mouth daily. 30 tablet 0  . spironolactone (ALDACTONE) 25 MG tablet Take 1 tablet (25 mg total) by mouth 2 (two) times daily. 180 tablet 0  . torsemide (DEMADEX) 20 MG tablet Take 1 tablet (20 mg total) by mouth daily. May increase to 60mg  if excess fluid 90 tablet 0  . TRESIBA FLEXTOUCH 100 UNIT/ML SOPN FlexTouch Pen Inject 10 Units into the skin See admin instructions. Inject 10 units at [facility] if blood sugar reading is >150  0  . Vitamin D, Ergocalciferol, (DRISDOL) 50000 units CAPS capsule TAKE 1 CAPSULE BY MOUTH TWICE WEEKLY(ON TUESDAY AND FRIDAY) (Patient taking differently: Take 50,000 Units by mouth 2 (two) times a week. Every Tuesday and Friday) 8 capsule 0   No current facility-administered medications for this encounter.    Vitals:   02/16/19 1449  BP: 118/70  Pulse: 78  SpO2: 98%  Weight: 86.8 kg (191 lb 6 oz)     Wt Readings from Last 3 Encounters:  02/16/19 86.8 kg (191 lb 6 oz)  10/09/18 87.3 kg (192 lb 6.4 oz)  10/01/18 86.8 kg (  191 lb 6.4 oz)   PHYSICAL EXAM:  General:  Well appearing. No resp difficulty HEENT: normal Neck: supple. no JVD. Carotids 2+ bilat; no bruits. No lymphadenopathy or thryomegaly appreciated. Cor: PMI nondisplaced. Regular rate & rhythm. No rubs, gallops or murmurs. Lungs: clear Abdomen: soft, nontender, nondistended. No hepatosplenomegaly. No bruits or masses. Good bowel sounds. Extremities: no cyanosis, clubbing, rash, edema  Brace on R knee Neuro: alert & orientedx3, cranial nerves grossly intact. moves all 4 extremities w/o difficulty. Affect  pleasant  ASSESSMENT & PLAN: 1. Chronic Systolic Heart Failure - NICM Cath 10/11/16 with normal cors. CPX 10/2016: Peak VO2: 18.5 (82% predicted peak VO2), VE/VCO2 slope: 30. ECHO 12/18 EF 25-30%  - S/P [employer] HeartLogic ICD 01/2018.  - Cardiac MRI in 02/2015 showed EF 46%. No LGE. - Repeat CPX 9/19 with mild HR limitation - Echo 02/12/19: EF 40-45% - Improved NYHA II - Volume status looks good. BP on low side. Will cut torsemide to 20mg  qod. If BP still low can consider dropping Entresto to 49/51 bid - Continue spiro 25 mg BID.  - Continue coreg 25 mg BID - Continue Entresto 97/103 mg BID. See above - Continue hydralazine 100 mg TID. Not on imdur due to headaches.  - She has been consented for cardiomems, but will plan to monitor her with HeartLogic device for now unless she has frequent hospitalizations for CHF 2. Sinus tach vs atrial flutter on ICD - Follows with Dr. Lovena Le. Now resolved. No change. No events on ICD interrogation today 3.  Lupus  - On chronic prednisone.  - Per PCP.  4. HTN - Stable. 5. OSA - Continue nightly CPAP. No change.  6. Migraines - Per PCP.  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 No change.  8. Cardiac clearance for total knee replacement - With improved echo. NYHA II symptoms she is at low risk for peri-operative CV complications. Can proceed ASAP without further cardiac testing  Glori Bickers, MD  3:08 PM

## 2019-02-16 NOTE — Addendum Note (Signed)
Encounter addended by: Valeda Malm, RN on: 02/16/2019 3:16 PM  Actions taken: Order list changed, Diagnosis association updated, Charge Capture section accepted, Clinical Note Signed

## 2019-02-18 ENCOUNTER — Encounter (HOSPITAL_COMMUNITY): Payer: Self-pay

## 2019-02-23 DIAGNOSIS — D649 Anemia, unspecified: Secondary | ICD-10-CM | POA: Diagnosis not present

## 2019-02-23 DIAGNOSIS — Z79899 Other long term (current) drug therapy: Secondary | ICD-10-CM | POA: Diagnosis not present

## 2019-02-23 DIAGNOSIS — M1711 Unilateral primary osteoarthritis, right knee: Secondary | ICD-10-CM | POA: Diagnosis not present

## 2019-02-23 DIAGNOSIS — M0579 Rheumatoid arthritis with rheumatoid factor of multiple sites without organ or systems involvement: Secondary | ICD-10-CM | POA: Diagnosis not present

## 2019-03-02 ENCOUNTER — Ambulatory Visit (INDEPENDENT_AMBULATORY_CARE_PROVIDER_SITE_OTHER): Payer: BLUE CROSS/BLUE SHIELD | Admitting: *Deleted

## 2019-03-02 DIAGNOSIS — I420 Dilated cardiomyopathy: Secondary | ICD-10-CM | POA: Diagnosis not present

## 2019-03-04 DIAGNOSIS — Z794 Long term (current) use of insulin: Secondary | ICD-10-CM | POA: Diagnosis not present

## 2019-03-04 DIAGNOSIS — E109 Type 1 diabetes mellitus without complications: Secondary | ICD-10-CM | POA: Diagnosis not present

## 2019-03-05 DIAGNOSIS — Z794 Long term (current) use of insulin: Secondary | ICD-10-CM | POA: Diagnosis not present

## 2019-03-05 DIAGNOSIS — E109 Type 1 diabetes mellitus without complications: Secondary | ICD-10-CM | POA: Diagnosis not present

## 2019-03-05 LAB — CUP PACEART REMOTE DEVICE CHECK
Date Time Interrogation Session: 20200313155922
Implantable Lead Implant Date: 20190206
Implantable Lead Location: 753860
Implantable Lead Model: 292
Implantable Lead Serial Number: 438194
Implantable Pulse Generator Implant Date: 20190206
Pulse Gen Serial Number: 243572

## 2019-03-08 ENCOUNTER — Other Ambulatory Visit: Payer: Self-pay | Admitting: Nurse Practitioner

## 2019-03-09 DIAGNOSIS — D688 Other specified coagulation defects: Secondary | ICD-10-CM | POA: Diagnosis not present

## 2019-03-09 DIAGNOSIS — Z96651 Presence of right artificial knee joint: Secondary | ICD-10-CM | POA: Diagnosis not present

## 2019-03-09 DIAGNOSIS — M351 Other overlap syndromes: Secondary | ICD-10-CM | POA: Diagnosis not present

## 2019-03-09 DIAGNOSIS — M069 Rheumatoid arthritis, unspecified: Secondary | ICD-10-CM | POA: Diagnosis not present

## 2019-03-09 DIAGNOSIS — E876 Hypokalemia: Secondary | ICD-10-CM | POA: Diagnosis not present

## 2019-03-09 DIAGNOSIS — M797 Fibromyalgia: Secondary | ICD-10-CM | POA: Diagnosis not present

## 2019-03-09 DIAGNOSIS — E119 Type 2 diabetes mellitus without complications: Secondary | ICD-10-CM | POA: Diagnosis not present

## 2019-03-09 DIAGNOSIS — Z7952 Long term (current) use of systemic steroids: Secondary | ICD-10-CM | POA: Diagnosis not present

## 2019-03-09 DIAGNOSIS — Z9989 Dependence on other enabling machines and devices: Secondary | ICD-10-CM | POA: Diagnosis not present

## 2019-03-09 DIAGNOSIS — G4733 Obstructive sleep apnea (adult) (pediatric): Secondary | ICD-10-CM | POA: Diagnosis not present

## 2019-03-09 DIAGNOSIS — G8918 Other acute postprocedural pain: Secondary | ICD-10-CM | POA: Diagnosis not present

## 2019-03-09 DIAGNOSIS — M1711 Unilateral primary osteoarthritis, right knee: Secondary | ICD-10-CM | POA: Diagnosis not present

## 2019-03-09 DIAGNOSIS — I420 Dilated cardiomyopathy: Secondary | ICD-10-CM | POA: Diagnosis not present

## 2019-03-09 DIAGNOSIS — M329 Systemic lupus erythematosus, unspecified: Secondary | ICD-10-CM | POA: Diagnosis not present

## 2019-03-09 DIAGNOSIS — Z471 Aftercare following joint replacement surgery: Secondary | ICD-10-CM | POA: Diagnosis not present

## 2019-03-09 DIAGNOSIS — D62 Acute posthemorrhagic anemia: Secondary | ICD-10-CM | POA: Diagnosis not present

## 2019-03-09 DIAGNOSIS — I5022 Chronic systolic (congestive) heart failure: Secondary | ICD-10-CM | POA: Diagnosis not present

## 2019-03-09 DIAGNOSIS — I110 Hypertensive heart disease with heart failure: Secondary | ICD-10-CM | POA: Diagnosis not present

## 2019-03-09 DIAGNOSIS — Z8673 Personal history of transient ischemic attack (TIA), and cerebral infarction without residual deficits: Secondary | ICD-10-CM | POA: Diagnosis not present

## 2019-03-09 DIAGNOSIS — K219 Gastro-esophageal reflux disease without esophagitis: Secondary | ICD-10-CM | POA: Diagnosis not present

## 2019-03-09 DIAGNOSIS — J45909 Unspecified asthma, uncomplicated: Secondary | ICD-10-CM | POA: Diagnosis not present

## 2019-03-10 ENCOUNTER — Encounter: Payer: Self-pay | Admitting: Cardiology

## 2019-03-10 ENCOUNTER — Other Ambulatory Visit: Payer: Self-pay | Admitting: Internal Medicine

## 2019-03-10 ENCOUNTER — Telehealth (HOSPITAL_COMMUNITY): Payer: Self-pay

## 2019-03-10 DIAGNOSIS — D688 Other specified coagulation defects: Secondary | ICD-10-CM | POA: Diagnosis not present

## 2019-03-10 DIAGNOSIS — I110 Hypertensive heart disease with heart failure: Secondary | ICD-10-CM | POA: Diagnosis not present

## 2019-03-10 DIAGNOSIS — M1711 Unilateral primary osteoarthritis, right knee: Secondary | ICD-10-CM | POA: Diagnosis not present

## 2019-03-10 DIAGNOSIS — M069 Rheumatoid arthritis, unspecified: Secondary | ICD-10-CM | POA: Diagnosis not present

## 2019-03-10 DIAGNOSIS — G8918 Other acute postprocedural pain: Secondary | ICD-10-CM | POA: Diagnosis not present

## 2019-03-10 DIAGNOSIS — M329 Systemic lupus erythematosus, unspecified: Secondary | ICD-10-CM | POA: Diagnosis not present

## 2019-03-10 NOTE — Telephone Encounter (Signed)
Prior authorization through [place] was initiated for [employer]-51 medication and sent via [place] on 03/10/2019.

## 2019-03-10 NOTE — Progress Notes (Signed)
Remote ICD transmission.   

## 2019-03-11 DIAGNOSIS — M1711 Unilateral primary osteoarthritis, right knee: Secondary | ICD-10-CM | POA: Diagnosis not present

## 2019-03-11 DIAGNOSIS — D688 Other specified coagulation defects: Secondary | ICD-10-CM | POA: Diagnosis not present

## 2019-03-12 ENCOUNTER — Ambulatory Visit: Payer: Self-pay | Admitting: Physical Therapy

## 2019-03-14 DIAGNOSIS — M797 Fibromyalgia: Secondary | ICD-10-CM | POA: Diagnosis not present

## 2019-03-14 DIAGNOSIS — M351 Other overlap syndromes: Secondary | ICD-10-CM | POA: Diagnosis not present

## 2019-03-14 DIAGNOSIS — D649 Anemia, unspecified: Secondary | ICD-10-CM | POA: Diagnosis not present

## 2019-03-14 DIAGNOSIS — I429 Cardiomyopathy, unspecified: Secondary | ICD-10-CM | POA: Diagnosis not present

## 2019-03-14 DIAGNOSIS — M069 Rheumatoid arthritis, unspecified: Secondary | ICD-10-CM | POA: Diagnosis not present

## 2019-03-14 DIAGNOSIS — G8929 Other chronic pain: Secondary | ICD-10-CM | POA: Diagnosis not present

## 2019-03-14 DIAGNOSIS — D688 Other specified coagulation defects: Secondary | ICD-10-CM | POA: Diagnosis not present

## 2019-03-14 DIAGNOSIS — Z96651 Presence of right artificial knee joint: Secondary | ICD-10-CM

## 2019-03-14 DIAGNOSIS — Z7952 Long term (current) use of systemic steroids: Secondary | ICD-10-CM

## 2019-03-14 DIAGNOSIS — G43909 Migraine, unspecified, not intractable, without status migrainosus: Secondary | ICD-10-CM | POA: Diagnosis not present

## 2019-03-14 DIAGNOSIS — I5022 Chronic systolic (congestive) heart failure: Secondary | ICD-10-CM | POA: Diagnosis not present

## 2019-03-14 DIAGNOSIS — M329 Systemic lupus erythematosus, unspecified: Secondary | ICD-10-CM | POA: Diagnosis not present

## 2019-03-14 DIAGNOSIS — E119 Type 2 diabetes mellitus without complications: Secondary | ICD-10-CM | POA: Diagnosis not present

## 2019-03-14 DIAGNOSIS — G4733 Obstructive sleep apnea (adult) (pediatric): Secondary | ICD-10-CM | POA: Diagnosis not present

## 2019-03-14 DIAGNOSIS — J45909 Unspecified asthma, uncomplicated: Secondary | ICD-10-CM | POA: Diagnosis not present

## 2019-03-14 DIAGNOSIS — Z8673 Personal history of transient ischemic attack (TIA), and cerebral infarction without residual deficits: Secondary | ICD-10-CM

## 2019-03-14 DIAGNOSIS — Z7982 Long term (current) use of aspirin: Secondary | ICD-10-CM | POA: Diagnosis not present

## 2019-03-14 DIAGNOSIS — I110 Hypertensive heart disease with heart failure: Secondary | ICD-10-CM | POA: Diagnosis not present

## 2019-03-14 DIAGNOSIS — Z471 Aftercare following joint replacement surgery: Secondary | ICD-10-CM | POA: Diagnosis not present

## 2019-03-14 DIAGNOSIS — Z9181 History of falling: Secondary | ICD-10-CM

## 2019-03-14 DIAGNOSIS — Z90710 Acquired absence of both cervix and uterus: Secondary | ICD-10-CM

## 2019-03-15 ENCOUNTER — Telehealth: Payer: Self-pay

## 2019-03-15 ENCOUNTER — Ambulatory Visit (INDEPENDENT_AMBULATORY_CARE_PROVIDER_SITE_OTHER): Payer: BLUE CROSS/BLUE SHIELD

## 2019-03-15 ENCOUNTER — Other Ambulatory Visit: Payer: Self-pay

## 2019-03-15 DIAGNOSIS — Z95810 Presence of automatic (implantable) cardiac defibrillator: Secondary | ICD-10-CM

## 2019-03-15 DIAGNOSIS — I5022 Chronic systolic (congestive) heart failure: Secondary | ICD-10-CM | POA: Diagnosis not present

## 2019-03-15 NOTE — Progress Notes (Signed)
EPIC Encounter for ICM Monitoring  Patient Name: Christine Mcdowell is a 48 y.o. female Date: 03/15/2019 Primary Care Physican: [name], MD Primary Cardiologist:Bensimhon Electrophysiologist:Taylor Lastweight198lbs  Attempted call to patient and unable to reach.  Left message to return call. Transmission reviewed.   HeartLogic Heart Failure Index is12 suggesting abnormal suggesting fluid accumulation.  Index started rising 03/08/2019 which correlates with total knee replacement 03/09/2019 per care everywhere notes  Prescribed:Torsemide20 mg take1 tablet (20 mg total) daily and may increase to 60 mg daily for excess fluid.Metolazone 2.5 mg takeone tablet as needed for weight gain of 5 lbs within 3 days.   Labs: 02/16/2019 Creatinine 0.98, BUN 10, Potassium 3.6, Sodium 142, GFR >60 10/17/2018 Creatinine 1.10, BUN 15, Potassium 4.0, Sodium 140, GFR 58->60 08/18/2018 Creatinine 1.65, BUN 19, Potassium 3.1, Sodium 142, GFR 36-41 05/20/2018 Creatinine 1.03, BUN 14, Potassium 3.9, Sodium 140, GFR >60  Recommendations: None, unable to reach.  Follow-up plan: ICM clinic phone appointment on4/06/2019 to recheck fluid levels. Office appointment with Dr Haroldine Laws 06/17/2019.  Copy of ICM check sent to Dr. Lovena Le and Dr Haroldine Laws.         [place], RN 03/15/2019 3:27 PM

## 2019-03-15 NOTE — Telephone Encounter (Signed)
Remote ICM transmission received.  Attempted call to patient regarding ICM remote transmission and left message, per DPR, to return call.    

## 2019-03-16 NOTE — Progress Notes (Addendum)
Returned call to patient as requested by voice mail message.  She had TKR on 3/16 and doing very well at home.  She is waiting for further PT orders in order to continue rehab.  She denies any fluid symptoms.  Transmission reviewed and advised if she starts to develop fluid symptoms she can take up to 60 mg Torsemide as prescribed and call if condition worsens.  Next remote transmission 03/30/2019.  No changes today. Heartlogic HF index is decreasing since surgery.

## 2019-03-17 DIAGNOSIS — I5022 Chronic systolic (congestive) heart failure: Secondary | ICD-10-CM | POA: Diagnosis not present

## 2019-03-17 DIAGNOSIS — M329 Systemic lupus erythematosus, unspecified: Secondary | ICD-10-CM | POA: Diagnosis not present

## 2019-03-17 DIAGNOSIS — G43909 Migraine, unspecified, not intractable, without status migrainosus: Secondary | ICD-10-CM | POA: Diagnosis not present

## 2019-03-17 DIAGNOSIS — M069 Rheumatoid arthritis, unspecified: Secondary | ICD-10-CM | POA: Diagnosis not present

## 2019-03-17 DIAGNOSIS — D688 Other specified coagulation defects: Secondary | ICD-10-CM | POA: Diagnosis not present

## 2019-03-17 DIAGNOSIS — G4733 Obstructive sleep apnea (adult) (pediatric): Secondary | ICD-10-CM | POA: Diagnosis not present

## 2019-03-17 DIAGNOSIS — I110 Hypertensive heart disease with heart failure: Secondary | ICD-10-CM | POA: Diagnosis not present

## 2019-03-17 DIAGNOSIS — M797 Fibromyalgia: Secondary | ICD-10-CM | POA: Diagnosis not present

## 2019-03-17 DIAGNOSIS — G8929 Other chronic pain: Secondary | ICD-10-CM | POA: Diagnosis not present

## 2019-03-17 DIAGNOSIS — J45909 Unspecified asthma, uncomplicated: Secondary | ICD-10-CM | POA: Diagnosis not present

## 2019-03-17 DIAGNOSIS — I429 Cardiomyopathy, unspecified: Secondary | ICD-10-CM | POA: Diagnosis not present

## 2019-03-17 DIAGNOSIS — Z471 Aftercare following joint replacement surgery: Secondary | ICD-10-CM | POA: Diagnosis not present

## 2019-03-17 DIAGNOSIS — M351 Other overlap syndromes: Secondary | ICD-10-CM | POA: Diagnosis not present

## 2019-03-17 DIAGNOSIS — Z7982 Long term (current) use of aspirin: Secondary | ICD-10-CM | POA: Diagnosis not present

## 2019-03-17 DIAGNOSIS — E119 Type 2 diabetes mellitus without complications: Secondary | ICD-10-CM | POA: Diagnosis not present

## 2019-03-17 DIAGNOSIS — D649 Anemia, unspecified: Secondary | ICD-10-CM | POA: Diagnosis not present

## 2019-03-18 NOTE — Telephone Encounter (Signed)
Spoke to [employer] and they stated that a prior authorization was not needed for this medication.

## 2019-03-19 DIAGNOSIS — I429 Cardiomyopathy, unspecified: Secondary | ICD-10-CM | POA: Diagnosis not present

## 2019-03-19 DIAGNOSIS — Z7982 Long term (current) use of aspirin: Secondary | ICD-10-CM | POA: Diagnosis not present

## 2019-03-19 DIAGNOSIS — M329 Systemic lupus erythematosus, unspecified: Secondary | ICD-10-CM | POA: Diagnosis not present

## 2019-03-19 DIAGNOSIS — D649 Anemia, unspecified: Secondary | ICD-10-CM | POA: Diagnosis not present

## 2019-03-19 DIAGNOSIS — M797 Fibromyalgia: Secondary | ICD-10-CM | POA: Diagnosis not present

## 2019-03-19 DIAGNOSIS — G4733 Obstructive sleep apnea (adult) (pediatric): Secondary | ICD-10-CM | POA: Diagnosis not present

## 2019-03-19 DIAGNOSIS — Z471 Aftercare following joint replacement surgery: Secondary | ICD-10-CM | POA: Diagnosis not present

## 2019-03-19 DIAGNOSIS — I5022 Chronic systolic (congestive) heart failure: Secondary | ICD-10-CM | POA: Diagnosis not present

## 2019-03-19 DIAGNOSIS — M351 Other overlap syndromes: Secondary | ICD-10-CM | POA: Diagnosis not present

## 2019-03-19 DIAGNOSIS — I110 Hypertensive heart disease with heart failure: Secondary | ICD-10-CM | POA: Diagnosis not present

## 2019-03-19 DIAGNOSIS — D688 Other specified coagulation defects: Secondary | ICD-10-CM | POA: Diagnosis not present

## 2019-03-19 DIAGNOSIS — J45909 Unspecified asthma, uncomplicated: Secondary | ICD-10-CM | POA: Diagnosis not present

## 2019-03-19 DIAGNOSIS — M069 Rheumatoid arthritis, unspecified: Secondary | ICD-10-CM | POA: Diagnosis not present

## 2019-03-19 DIAGNOSIS — G8929 Other chronic pain: Secondary | ICD-10-CM | POA: Diagnosis not present

## 2019-03-19 DIAGNOSIS — E119 Type 2 diabetes mellitus without complications: Secondary | ICD-10-CM | POA: Diagnosis not present

## 2019-03-19 DIAGNOSIS — G43909 Migraine, unspecified, not intractable, without status migrainosus: Secondary | ICD-10-CM | POA: Diagnosis not present

## 2019-03-22 DIAGNOSIS — E119 Type 2 diabetes mellitus without complications: Secondary | ICD-10-CM | POA: Diagnosis not present

## 2019-03-22 DIAGNOSIS — Z7982 Long term (current) use of aspirin: Secondary | ICD-10-CM | POA: Diagnosis not present

## 2019-03-22 DIAGNOSIS — G8929 Other chronic pain: Secondary | ICD-10-CM | POA: Diagnosis not present

## 2019-03-22 DIAGNOSIS — G4733 Obstructive sleep apnea (adult) (pediatric): Secondary | ICD-10-CM | POA: Diagnosis not present

## 2019-03-22 DIAGNOSIS — M797 Fibromyalgia: Secondary | ICD-10-CM | POA: Diagnosis not present

## 2019-03-22 DIAGNOSIS — D649 Anemia, unspecified: Secondary | ICD-10-CM | POA: Diagnosis not present

## 2019-03-22 DIAGNOSIS — M351 Other overlap syndromes: Secondary | ICD-10-CM | POA: Diagnosis not present

## 2019-03-22 DIAGNOSIS — I110 Hypertensive heart disease with heart failure: Secondary | ICD-10-CM | POA: Diagnosis not present

## 2019-03-22 DIAGNOSIS — M069 Rheumatoid arthritis, unspecified: Secondary | ICD-10-CM | POA: Diagnosis not present

## 2019-03-22 DIAGNOSIS — M329 Systemic lupus erythematosus, unspecified: Secondary | ICD-10-CM | POA: Diagnosis not present

## 2019-03-22 DIAGNOSIS — I5022 Chronic systolic (congestive) heart failure: Secondary | ICD-10-CM | POA: Diagnosis not present

## 2019-03-22 DIAGNOSIS — Z471 Aftercare following joint replacement surgery: Secondary | ICD-10-CM | POA: Diagnosis not present

## 2019-03-22 DIAGNOSIS — I429 Cardiomyopathy, unspecified: Secondary | ICD-10-CM | POA: Diagnosis not present

## 2019-03-22 DIAGNOSIS — G43909 Migraine, unspecified, not intractable, without status migrainosus: Secondary | ICD-10-CM | POA: Diagnosis not present

## 2019-03-22 DIAGNOSIS — D688 Other specified coagulation defects: Secondary | ICD-10-CM | POA: Diagnosis not present

## 2019-03-22 DIAGNOSIS — J45909 Unspecified asthma, uncomplicated: Secondary | ICD-10-CM | POA: Diagnosis not present

## 2019-03-24 DIAGNOSIS — Z471 Aftercare following joint replacement surgery: Secondary | ICD-10-CM | POA: Diagnosis not present

## 2019-03-24 DIAGNOSIS — Z96651 Presence of right artificial knee joint: Secondary | ICD-10-CM | POA: Diagnosis not present

## 2019-03-24 DIAGNOSIS — G43909 Migraine, unspecified, not intractable, without status migrainosus: Secondary | ICD-10-CM | POA: Diagnosis not present

## 2019-03-24 DIAGNOSIS — Z7982 Long term (current) use of aspirin: Secondary | ICD-10-CM | POA: Diagnosis not present

## 2019-03-24 DIAGNOSIS — I5022 Chronic systolic (congestive) heart failure: Secondary | ICD-10-CM | POA: Diagnosis not present

## 2019-03-24 DIAGNOSIS — D688 Other specified coagulation defects: Secondary | ICD-10-CM | POA: Diagnosis not present

## 2019-03-24 DIAGNOSIS — G8929 Other chronic pain: Secondary | ICD-10-CM | POA: Diagnosis not present

## 2019-03-24 DIAGNOSIS — G4733 Obstructive sleep apnea (adult) (pediatric): Secondary | ICD-10-CM | POA: Diagnosis not present

## 2019-03-24 DIAGNOSIS — M797 Fibromyalgia: Secondary | ICD-10-CM | POA: Diagnosis not present

## 2019-03-24 DIAGNOSIS — E119 Type 2 diabetes mellitus without complications: Secondary | ICD-10-CM | POA: Diagnosis not present

## 2019-03-24 DIAGNOSIS — I110 Hypertensive heart disease with heart failure: Secondary | ICD-10-CM | POA: Diagnosis not present

## 2019-03-24 DIAGNOSIS — I429 Cardiomyopathy, unspecified: Secondary | ICD-10-CM | POA: Diagnosis not present

## 2019-03-24 DIAGNOSIS — D649 Anemia, unspecified: Secondary | ICD-10-CM | POA: Diagnosis not present

## 2019-03-24 DIAGNOSIS — R252 Cramp and spasm: Secondary | ICD-10-CM | POA: Diagnosis not present

## 2019-03-24 DIAGNOSIS — M351 Other overlap syndromes: Secondary | ICD-10-CM | POA: Diagnosis not present

## 2019-03-24 DIAGNOSIS — M069 Rheumatoid arthritis, unspecified: Secondary | ICD-10-CM | POA: Diagnosis not present

## 2019-03-24 DIAGNOSIS — M329 Systemic lupus erythematosus, unspecified: Secondary | ICD-10-CM | POA: Diagnosis not present

## 2019-03-24 DIAGNOSIS — J45909 Unspecified asthma, uncomplicated: Secondary | ICD-10-CM | POA: Diagnosis not present

## 2019-03-29 ENCOUNTER — Ambulatory Visit (INDEPENDENT_AMBULATORY_CARE_PROVIDER_SITE_OTHER): Payer: BLUE CROSS/BLUE SHIELD | Admitting: Nurse Practitioner

## 2019-03-29 ENCOUNTER — Ambulatory Visit: Payer: Self-pay | Admitting: Physical Therapy

## 2019-03-29 ENCOUNTER — Encounter: Payer: Self-pay | Admitting: Nurse Practitioner

## 2019-03-29 ENCOUNTER — Other Ambulatory Visit: Payer: Self-pay

## 2019-03-29 VITALS — BP 120/60 | HR 85 | Temp 98.6°F | Ht 62.2 in | Wt 186.0 lb

## 2019-03-29 DIAGNOSIS — M329 Systemic lupus erythematosus, unspecified: Secondary | ICD-10-CM

## 2019-03-29 DIAGNOSIS — E1169 Type 2 diabetes mellitus with other specified complication: Secondary | ICD-10-CM

## 2019-03-29 DIAGNOSIS — D649 Anemia, unspecified: Secondary | ICD-10-CM | POA: Diagnosis not present

## 2019-03-29 DIAGNOSIS — J45909 Unspecified asthma, uncomplicated: Secondary | ICD-10-CM | POA: Diagnosis not present

## 2019-03-29 DIAGNOSIS — Z79899 Other long term (current) drug therapy: Secondary | ICD-10-CM | POA: Diagnosis not present

## 2019-03-29 DIAGNOSIS — I5022 Chronic systolic (congestive) heart failure: Secondary | ICD-10-CM | POA: Diagnosis not present

## 2019-03-29 DIAGNOSIS — Z471 Aftercare following joint replacement surgery: Secondary | ICD-10-CM | POA: Diagnosis not present

## 2019-03-29 DIAGNOSIS — I429 Cardiomyopathy, unspecified: Secondary | ICD-10-CM | POA: Diagnosis not present

## 2019-03-29 DIAGNOSIS — G43909 Migraine, unspecified, not intractable, without status migrainosus: Secondary | ICD-10-CM | POA: Diagnosis not present

## 2019-03-29 DIAGNOSIS — E119 Type 2 diabetes mellitus without complications: Secondary | ICD-10-CM | POA: Diagnosis not present

## 2019-03-29 DIAGNOSIS — M351 Other overlap syndromes: Secondary | ICD-10-CM | POA: Diagnosis not present

## 2019-03-29 DIAGNOSIS — Z7982 Long term (current) use of aspirin: Secondary | ICD-10-CM | POA: Diagnosis not present

## 2019-03-29 DIAGNOSIS — G4733 Obstructive sleep apnea (adult) (pediatric): Secondary | ICD-10-CM | POA: Diagnosis not present

## 2019-03-29 DIAGNOSIS — I10 Essential (primary) hypertension: Secondary | ICD-10-CM

## 2019-03-29 DIAGNOSIS — I110 Hypertensive heart disease with heart failure: Secondary | ICD-10-CM | POA: Diagnosis not present

## 2019-03-29 DIAGNOSIS — G8929 Other chronic pain: Secondary | ICD-10-CM | POA: Diagnosis not present

## 2019-03-29 DIAGNOSIS — M069 Rheumatoid arthritis, unspecified: Secondary | ICD-10-CM | POA: Diagnosis not present

## 2019-03-29 DIAGNOSIS — D688 Other specified coagulation defects: Secondary | ICD-10-CM | POA: Diagnosis not present

## 2019-03-29 DIAGNOSIS — M797 Fibromyalgia: Secondary | ICD-10-CM | POA: Diagnosis not present

## 2019-03-29 NOTE — Progress Notes (Signed)
Subjective:     Patient ID: [name] , female    DOB: 02-24-1971 , 48 y.o.   MRN: [id number]   Chief Complaint  Patient presents with  . Diabetes    HPI  Right knee replacement done 3 weeks ago.  Her mother passed away - abdominal mass unsure of type?  Her mother was 67 at that time.  She is getting PT 3 times a week.  She is being weaned off her prednisone  Diabetes  She presents for her follow-up diabetic visit. She has type 2 diabetes mellitus. Her disease course has been stable. Hypoglycemia symptoms include dizziness. Pertinent negatives for hypoglycemia include no confusion, headaches, nervousness/anxiousness or sweats. Pertinent negatives for diabetes include no blurred vision, no fatigue, no polydipsia, no polyphagia and no polyuria. There are no hypoglycemic complications. Symptoms are improving (she is being weaned off her prednisone - 7.5mg  once a day.  ). There are no diabetic complications. Risk factors for coronary artery disease include obesity and sedentary lifestyle. Her home blood glucose trend is decreasing steadily. An ACE inhibitor/angiotensin II receptor blocker is being taken. Eye exam is not current (had eye surgery in November and January - cataract removal).  Hypertension  This is a chronic problem. The current episode started more than 1 year ago (heart function from 14-45.  She is not needing a heart transplant as long as stays where it is.  ). The problem is controlled. Pertinent negatives include no blurred vision, headaches or sweats. There are no compliance problems.  There is no history of angina or kidney disease. There is no history of chronic renal disease.     Past Medical History:  Diagnosis Date  . AICD (automatic cardioverter/defibrillator) present 01/28/2018  . Anemia   . Anginal pain ([place])   . Asthma   . Cervical cancer ([place])   . CHF (congestive heart failure) ([place])   . Diabetes mellitus without complication ([place])    steroid induced   . Discoid lupus   . Fibromyalgia   . History of blood transfusion "several"   "related to anemia; had some w/hysterectomy also"  . Hx of cardiovascular stress test    ETT-Myoview (9/15):  No ischemia, EF 52%; NORMAL  . Hx of echocardiogram    Echo (9/15):  EF 50-55%, ant HK, Gr 1 DD, mild MR, mild LAE, no effusion  . Hypertension   . Iron deficiency anemia    h/o iron transfusions  . Lupus (systemic lupus erythematosus) ([place])   . Migraine    "a few/year" (07/03/2016)  . Pneumonia 12/2015  . RA (rheumatoid arthritis) ([place])    "all over" (07/03/2016)  . Sickle cell trait ([place])   . Stroke ([place]) 2014 X 1; 2015 X 2; 2016 X 1;    "right side of face more relaxed than the other; rare speech hesitation" (07/03/2016)  . Vaginal Pap smear, abnormal    ASCUS; HPV     Family History  Problem Relation Age of Onset  . Arthritis Mother   . Heart murmur Mother   . Drug abuse Mother   . Allergies Mother   . Heart attack Father   . Cushing syndrome Father   . Depression Father   . Allergies Father   . Dementia Paternal Grandmother   . Cancer Paternal Grandfather   . Diabetes Maternal Grandmother   . Hypertension Maternal Grandmother   . Asthma Maternal Grandmother   . Heart attack Maternal Grandfather      Current Outpatient Medications:  .  ACCU-CHEK FASTCLIX LANCETS MISC, USE TO PRICK FINGER TO TEST BLOOD SUGAR BEFORE BREAKFAST AND DINNER, Disp: , Rfl: 10 .  albuterol (ACCUNEB) 1.25 MG/3ML nebulizer solution, Take 3 mLs (1.25 mg total) by nebulization every 6 (six) hours as needed for wheezing., Disp: 75 mL, Rfl: 12 .  albuterol (PROVENTIL HFA;VENTOLIN HFA) 108 (90 Base) MCG/ACT inhaler, Inhale 2 puffs into the lungs daily as needed for shortness of breath., Disp: 1 Inhaler, Rfl: 0 .  carvedilol (COREG) 25 MG tablet, TAKE 1 TABLET(25 MG) BY MOUTH TWICE DAILY WITH A MEAL, Disp: 180 tablet, Rfl: 0 .  cromolyn (OPTICROM) 4 % ophthalmic solution, Place 1 drop into both eyes daily., Disp: ,  Rfl: 0 .  diclofenac sodium (VOLTAREN) 1 % GEL, Apply topically 2 (two) times daily. Apply to joints, Disp: , Rfl: 2 .  folic acid (FOLVITE) 1 MG tablet, TAKE 1 TABLET BY MOUTH EVERY MORNING (Patient taking differently: Take 1 mg by mouth daily. ), Disp: 30 tablet, Rfl: 0 .  hydrALAZINE (APRESOLINE) 50 MG tablet, Take 2 tablets (100 mg total) by mouth 3 (three) times daily., Disp: 180 tablet, Rfl: 0 .  hydroxychloroquine (PLAQUENIL) 200 MG tablet, Take 1 tablet (200 mg total) by mouth 2 (two) times daily., Disp: 180 tablet, Rfl: 0 .  insulin aspart (NOVOLOG) 100 UNIT/ML injection, Inject 2-10 Units into the skin See admin instructions. Only if blood sugar is above 150; on sliding scale, Disp: , Rfl:  .  methotrexate (RHEUMATREX) 2.5 MG tablet, Take 20 mg by mouth every Friday. , Disp: , Rfl: 3 .  metolazone (ZAROXOLYN) 2.5 MG tablet, TAKE 1 TABLET BY MOUTH AS NEEDED FOR WEIGHT GAIN OF 5 POUNDS WITHIN 3 DAYS AS DIRECTED, Disp: 5 tablet, Rfl: 3 .  metroNIDAZOLE (METROGEL VAGINAL) 0.75 % vaginal gel, Nightly x 5 nights, Disp: 70 g, Rfl: 0 .  NOVOLOG FLEXPEN 100 UNIT/ML FlexPen, INJECT PER SLIDING SCALE TWICE DAILY**MAX OF 50 UNITS EVERY DAY, Disp: 5 pen, Rfl: 0 .  ondansetron (ZOFRAN) 4 MG tablet, Take 1 tablet (4 mg total) by mouth every 8 (eight) hours as needed for nausea or vomiting., Disp: 20 tablet, Rfl: 0 .  oxyCODONE (OXY IR/ROXICODONE) 5 MG immediate release tablet, Take 5 mg by mouth every 4 (four) hours as needed for severe pain., Disp: , Rfl:  .  pantoprazole (PROTONIX) 40 MG tablet, TAKE 1 TABLET(40 MG) BY MOUTH DAILY 30 TO 60 MINUTES BEFORE FIRST MEAL OF THE DAY, Disp: 30 tablet, Rfl: 3 .  predniSONE (DELTASONE) 5 MG tablet, Take 7.5 mg by mouth daily with breakfast. , Disp: , Rfl:  .  sacubitril-valsartan (ENTRESTO) 49-51 MG, Take 1 tablet by mouth 2 (two) times daily., Disp: 180 tablet, Rfl: 0 .  sennosides-docusate sodium (SENOKOT-S) 8.6-50 MG tablet, Take 1 tablet by mouth daily., Disp:  , Rfl:  .  sertraline (ZOLOFT) 50 MG tablet, Take 1 tablet (50 mg total) by mouth daily., Disp: 30 tablet, Rfl: 0 .  spironolactone (ALDACTONE) 25 MG tablet, Take 1 tablet (25 mg total) by mouth 2 (two) times daily., Disp: 180 tablet, Rfl: 0 .  sulfamethoxazole-trimethoprim (BACTRIM DS,SEPTRA DS) 800-160 MG tablet, Take 1 tablet by mouth 2 (two) times daily., Disp: , Rfl:  .  torsemide (DEMADEX) 20 MG tablet, Take 1 tablet (20 mg total) by mouth daily. May increase to 60mg  if excess fluid, Disp: 90 tablet, Rfl: 0 .  Vitamin D, Ergocalciferol, (DRISDOL) 50000 units CAPS capsule, TAKE 1 CAPSULE BY MOUTH TWICE WEEKLY(ON TUESDAY  AND FRIDAY) (Patient taking differently: Take 50,000 Units by mouth 2 (two) times a week. Every Tuesday and Friday), Disp: 8 capsule, Rfl: 0 .  aspirin 81 MG chewable tablet, Chew 162 mg by mouth every morning. , Disp: , Rfl:  .  benzonatate (TESSALON) 100 MG capsule, Take 1 capsule (100 mg total) by mouth 3 (three) times daily. (Patient not taking: Reported on 03/29/2019), Disp: 30 capsule, Rfl: 1 .  citalopram (CELEXA) 10 MG tablet, Take 1 tablet (10 mg total) by mouth daily. (Patient not taking: Reported on 03/29/2019), Disp: 90 tablet, Rfl: 0 .  ipratropium (ATROVENT) 0.03 % nasal spray, Place 2 sprays into both nostrils every 6 (six) hours as needed for rhinitis. (Patient not taking: Reported on 03/29/2019), Disp: 30 mL, Rfl: 12 .  meloxicam (MOBIC) 15 MG tablet, Take 15 mg by mouth daily. , Disp: , Rfl:  .  metFORMIN (GLUCOPHAGE) 500 MG tablet, TAKE 1 TABLET BY MOUTH EVERY DAY WITH THE MORNING AND EVENING MEAL (Patient not taking: Reported on 03/29/2019), Disp: 90 tablet, Rfl: 0 .  Multiple Vitamin (MULTIVITAMIN WITH MINERALS) TABS, Take 1 tablet by mouth every morning. , Disp: , Rfl:  .  Sarilumab (KEVZARA) 150 MG/1.14ML SOAJ, Inject 150 mg into the skin every 14 (fourteen) days. , Disp: , Rfl:  .  TRESIBA FLEXTOUCH 100 UNIT/ML SOPN FlexTouch Pen, Inject 10 Units into the skin See  admin instructions. Inject 10 units at [facility] if blood sugar reading is >150, Disp: , Rfl: 0   Allergies  Allergen Reactions  . Hydromorphone Nausea And Vomiting    Other reaction(s): GI Upset (intolerance), Hypertension (intolerance) Raises blood pressure  Other reaction(s): GI Upset (intolerance), Hypertension (intolerance) Raises blood pressure to stroke level  . Iodinated Diagnostic Agents Other (See Comments)    Shuts down kidneys Shuts kidney function down  . Other Other (See Comments) and Anaphylaxis    Spicy foods and seasonings Skin Prep "makes my skin peel off" Paper tape causes skin burns  . Erythromycin Nausea And Vomiting  . Latex Hives  . Tape Other (See Comments)    "Skin burns"  . Mircette [Desogestrel-Ethinyl Estradiol] Nausea And Vomiting and Rash     Review of Systems  Constitutional: Negative for fatigue.  Eyes: Negative for blurred vision.  Respiratory: Negative.   Cardiovascular: Negative.   Endocrine: Negative for polydipsia, polyphagia and polyuria.  Musculoskeletal: Negative.   Neurological: Positive for dizziness. Negative for headaches.  Psychiatric/Behavioral: Negative for confusion. The patient is not nervous/anxious.      Today's Vitals   03/29/19 1447  BP: 120/60  Pulse: 85  Temp: 98.6 F (37 C)  TempSrc: Oral  SpO2: 97%  Weight: 186 lb (84.4 kg)  Height: 5' 2.2" (1.58 m)   Body mass index is 33.8 kg/m.   Objective:  Physical Exam Vitals signs reviewed.  Constitutional:      Appearance: Normal appearance.  Cardiovascular:     Rate and Rhythm: Normal rate and regular rhythm.     Pulses: Normal pulses.     Heart sounds: Normal heart sounds. No murmur.  Pulmonary:     Effort: Pulmonary effort is normal. No respiratory distress.     Breath sounds: Normal breath sounds.  Neurological:     General: No focal deficit present.     Mental Status: She is alert and oriented to person, place, and time.  Psychiatric:        Mood and  Affect: Mood normal.  Behavior: Behavior normal.        Thought Content: Thought content normal.        Judgment: Judgment normal.         Assessment And Plan:     1. Type 2 diabetes mellitus with other specified complication, without long-term current use of insulin (HCC)  Chronic, fair control,  She has stopped taking her medications she states since her blood sugar was down, will recheck HgbA1c pending results will restart.    She is using a scanner glucometer  Encouraged to limit intake of sugary foods and drinks  Encouraged to increase physical activity to 150 minutes per week - Lipid panel - CMP14 + Anion Gap - Hemoglobin A1c  2. Essential hypertension B/P is controlled.  CMP ordered to check renal function.  The importance of regular exercise and dietary modification was stressed to the patient.  Stressed importance of losing ten percent of her body weight to help with B/P control.  Continue your follow up with Cardiology - CMP14 + Anion Gap  3. Chronic systolic heart failure ([place])  Continue with follow up with Dr. Awilda Bill  She is doing well overall with this, no edema noted to her lower extremities this visit  4. Systemic lupus erythematosus, unspecified SLE type, unspecified organ involvement status ([place])  Controlled and being followed by Dr Rogers Blocker, she is currently taking Plaquenil and if she begins to have problems with getting the medications.    5. Other long term (current) drug therapy  - TSH    [name], FNP    THE PATIENT IS ENCOURAGED TO PRACTICE SOCIAL DISTANCING DUE TO THE COVID-19 PANDEMIC.

## 2019-03-30 ENCOUNTER — Ambulatory Visit: Payer: BLUE CROSS/BLUE SHIELD

## 2019-03-30 DIAGNOSIS — I5022 Chronic systolic (congestive) heart failure: Secondary | ICD-10-CM

## 2019-03-30 DIAGNOSIS — Z95810 Presence of automatic (implantable) cardiac defibrillator: Secondary | ICD-10-CM

## 2019-03-30 LAB — CMP14 + ANION GAP
ALT: 11 IU/L (ref 0–32)
AST: 14 IU/L (ref 0–40)
Albumin/Globulin Ratio: 1.9 (ref 1.2–2.2)
Albumin: 4.8 g/dL (ref 3.8–4.8)
Alkaline Phosphatase: 56 IU/L (ref 39–117)
Anion Gap: 19 mmol/L — ABNORMAL HIGH (ref 10.0–18.0)
BUN/Creatinine Ratio: 12 (ref 9–23)
BUN: 14 mg/dL (ref 6–24)
Bilirubin Total: 0.2 mg/dL (ref 0.0–1.2)
CO2: 22 mmol/L (ref 20–29)
Calcium: 10 mg/dL (ref 8.7–10.2)
Chloride: 96 mmol/L (ref 96–106)
Creatinine, Ser: 1.15 mg/dL — ABNORMAL HIGH (ref 0.57–1.00)
GFR calc Af Amer: 64 mL/min/{1.73_m2} (ref >59)
GFR calc non Af Amer: 56 mL/min/{1.73_m2} — ABNORMAL LOW (ref >59)
Globulin, Total: 2.5 g/dL (ref 1.5–4.5)
Glucose: 128 mg/dL — ABNORMAL HIGH (ref 65–99)
Potassium: 5.2 mmol/L (ref 3.5–5.2)
Sodium: 137 mmol/L (ref 134–144)
Total Protein: 7.3 g/dL (ref 6.0–8.5)

## 2019-03-30 LAB — LIPID PANEL
Chol/HDL Ratio: 5.4 ratio — ABNORMAL HIGH (ref 0.0–4.4)
Cholesterol, Total: 216 mg/dL — ABNORMAL HIGH (ref 100–199)
HDL: 40 mg/dL (ref >39)
LDL Calculated: 126 mg/dL — ABNORMAL HIGH (ref 0–99)
Triglycerides: 252 mg/dL — ABNORMAL HIGH (ref 0–149)
VLDL Cholesterol Cal: 50 mg/dL — ABNORMAL HIGH (ref 5–40)

## 2019-03-30 LAB — HEMOGLOBIN A1C
Est. average glucose Bld gHb Est-mCnc: 126 mg/dL
Hgb A1c MFr Bld: 6 % — ABNORMAL HIGH (ref 4.8–5.6)

## 2019-03-30 LAB — TSH: TSH: 0.75 u[IU]/mL (ref 0.450–4.500)

## 2019-03-31 DIAGNOSIS — G43909 Migraine, unspecified, not intractable, without status migrainosus: Secondary | ICD-10-CM | POA: Diagnosis not present

## 2019-03-31 DIAGNOSIS — M329 Systemic lupus erythematosus, unspecified: Secondary | ICD-10-CM | POA: Diagnosis not present

## 2019-03-31 DIAGNOSIS — Z7982 Long term (current) use of aspirin: Secondary | ICD-10-CM | POA: Diagnosis not present

## 2019-03-31 DIAGNOSIS — E119 Type 2 diabetes mellitus without complications: Secondary | ICD-10-CM | POA: Diagnosis not present

## 2019-03-31 DIAGNOSIS — M069 Rheumatoid arthritis, unspecified: Secondary | ICD-10-CM | POA: Diagnosis not present

## 2019-03-31 DIAGNOSIS — D688 Other specified coagulation defects: Secondary | ICD-10-CM | POA: Diagnosis not present

## 2019-03-31 DIAGNOSIS — G8929 Other chronic pain: Secondary | ICD-10-CM | POA: Diagnosis not present

## 2019-03-31 DIAGNOSIS — M351 Other overlap syndromes: Secondary | ICD-10-CM | POA: Diagnosis not present

## 2019-03-31 DIAGNOSIS — I110 Hypertensive heart disease with heart failure: Secondary | ICD-10-CM | POA: Diagnosis not present

## 2019-03-31 DIAGNOSIS — Z471 Aftercare following joint replacement surgery: Secondary | ICD-10-CM | POA: Diagnosis not present

## 2019-03-31 DIAGNOSIS — D649 Anemia, unspecified: Secondary | ICD-10-CM | POA: Diagnosis not present

## 2019-03-31 DIAGNOSIS — M797 Fibromyalgia: Secondary | ICD-10-CM | POA: Diagnosis not present

## 2019-03-31 DIAGNOSIS — G4733 Obstructive sleep apnea (adult) (pediatric): Secondary | ICD-10-CM | POA: Diagnosis not present

## 2019-03-31 DIAGNOSIS — I5022 Chronic systolic (congestive) heart failure: Secondary | ICD-10-CM | POA: Diagnosis not present

## 2019-03-31 DIAGNOSIS — J45909 Unspecified asthma, uncomplicated: Secondary | ICD-10-CM | POA: Diagnosis not present

## 2019-03-31 DIAGNOSIS — I429 Cardiomyopathy, unspecified: Secondary | ICD-10-CM | POA: Diagnosis not present

## 2019-03-31 NOTE — Progress Notes (Signed)
EPIC Encounter for ICM Monitoring  Patient Name: Christine Mcdowell is a 48 y.o. female Date: 03/31/2019 Primary Care Physican: [name], MD Primary Cardiologist:Bensimhon Electrophysiologist:Taylor Lastweight198lbs  Attempted call to patient and unable to reach.  Left message to return call. Transmission reviewed.  Recovering from 03/08/2019 TKR surgery.  HeartLogic Heart Failure Index is0 suggesting normal.   Prescribed:Torsemide20 mg take1tablet (20 mg total)daily and may increase to 60 mg daily for excess fluid.Metolazone 2.5 mg takeone tablet as needed for weight gain of 5 lbs within 3 days.   Labs: 02/16/2019 Creatinine 0.98, BUN 10, Potassium 3.6, Sodium 142, GFR >60 10/17/2018 Creatinine 1.10, BUN 15, Potassium 4.0, Sodium 140, GFR 58->60 08/18/2018 Creatinine 1.65, BUN 19, Potassium 3.1, Sodium 142, GFR 36-41 05/20/2018 Creatinine 1.03, BUN 14, Potassium 3.9, Sodium 140, GFR >60  Recommendations:None, unable to reach.  Follow-up plan: ICM clinic phone appointment [id number].Office appointment with Dr Haroldine Laws 06/17/2019.  Copy of ICM check sent to Dr. Lovena Le     [place], RN 03/31/2019 12:53 PM

## 2019-04-02 ENCOUNTER — Telehealth (HOSPITAL_COMMUNITY): Payer: Self-pay

## 2019-04-02 DIAGNOSIS — M797 Fibromyalgia: Secondary | ICD-10-CM | POA: Diagnosis not present

## 2019-04-02 DIAGNOSIS — I5022 Chronic systolic (congestive) heart failure: Secondary | ICD-10-CM | POA: Diagnosis not present

## 2019-04-02 DIAGNOSIS — G4733 Obstructive sleep apnea (adult) (pediatric): Secondary | ICD-10-CM | POA: Diagnosis not present

## 2019-04-02 DIAGNOSIS — M329 Systemic lupus erythematosus, unspecified: Secondary | ICD-10-CM | POA: Diagnosis not present

## 2019-04-02 DIAGNOSIS — I110 Hypertensive heart disease with heart failure: Secondary | ICD-10-CM | POA: Diagnosis not present

## 2019-04-02 DIAGNOSIS — M351 Other overlap syndromes: Secondary | ICD-10-CM | POA: Diagnosis not present

## 2019-04-02 DIAGNOSIS — Z7982 Long term (current) use of aspirin: Secondary | ICD-10-CM | POA: Diagnosis not present

## 2019-04-02 DIAGNOSIS — G43909 Migraine, unspecified, not intractable, without status migrainosus: Secondary | ICD-10-CM | POA: Diagnosis not present

## 2019-04-02 DIAGNOSIS — I429 Cardiomyopathy, unspecified: Secondary | ICD-10-CM | POA: Diagnosis not present

## 2019-04-02 DIAGNOSIS — Z471 Aftercare following joint replacement surgery: Secondary | ICD-10-CM | POA: Diagnosis not present

## 2019-04-02 DIAGNOSIS — M069 Rheumatoid arthritis, unspecified: Secondary | ICD-10-CM | POA: Diagnosis not present

## 2019-04-02 DIAGNOSIS — D688 Other specified coagulation defects: Secondary | ICD-10-CM | POA: Diagnosis not present

## 2019-04-02 DIAGNOSIS — E119 Type 2 diabetes mellitus without complications: Secondary | ICD-10-CM | POA: Diagnosis not present

## 2019-04-02 DIAGNOSIS — J45909 Unspecified asthma, uncomplicated: Secondary | ICD-10-CM | POA: Diagnosis not present

## 2019-04-02 DIAGNOSIS — D649 Anemia, unspecified: Secondary | ICD-10-CM | POA: Diagnosis not present

## 2019-04-02 DIAGNOSIS — G8929 Other chronic pain: Secondary | ICD-10-CM | POA: Diagnosis not present

## 2019-04-05 ENCOUNTER — Other Ambulatory Visit (HOSPITAL_COMMUNITY): Payer: Self-pay | Admitting: Psychiatry

## 2019-04-05 ENCOUNTER — Other Ambulatory Visit (HOSPITAL_COMMUNITY): Payer: Self-pay | Admitting: Internal Medicine

## 2019-04-05 DIAGNOSIS — Z471 Aftercare following joint replacement surgery: Secondary | ICD-10-CM | POA: Diagnosis not present

## 2019-04-05 DIAGNOSIS — I429 Cardiomyopathy, unspecified: Secondary | ICD-10-CM | POA: Diagnosis not present

## 2019-04-05 DIAGNOSIS — J45909 Unspecified asthma, uncomplicated: Secondary | ICD-10-CM | POA: Diagnosis not present

## 2019-04-05 DIAGNOSIS — G4733 Obstructive sleep apnea (adult) (pediatric): Secondary | ICD-10-CM | POA: Diagnosis not present

## 2019-04-05 DIAGNOSIS — E119 Type 2 diabetes mellitus without complications: Secondary | ICD-10-CM | POA: Diagnosis not present

## 2019-04-05 DIAGNOSIS — I5022 Chronic systolic (congestive) heart failure: Secondary | ICD-10-CM | POA: Diagnosis not present

## 2019-04-05 DIAGNOSIS — M351 Other overlap syndromes: Secondary | ICD-10-CM | POA: Diagnosis not present

## 2019-04-05 DIAGNOSIS — D649 Anemia, unspecified: Secondary | ICD-10-CM | POA: Diagnosis not present

## 2019-04-05 DIAGNOSIS — G8929 Other chronic pain: Secondary | ICD-10-CM | POA: Diagnosis not present

## 2019-04-05 DIAGNOSIS — M797 Fibromyalgia: Secondary | ICD-10-CM | POA: Diagnosis not present

## 2019-04-05 DIAGNOSIS — G43909 Migraine, unspecified, not intractable, without status migrainosus: Secondary | ICD-10-CM | POA: Diagnosis not present

## 2019-04-05 DIAGNOSIS — D688 Other specified coagulation defects: Secondary | ICD-10-CM | POA: Diagnosis not present

## 2019-04-05 DIAGNOSIS — I110 Hypertensive heart disease with heart failure: Secondary | ICD-10-CM | POA: Diagnosis not present

## 2019-04-05 DIAGNOSIS — M329 Systemic lupus erythematosus, unspecified: Secondary | ICD-10-CM | POA: Diagnosis not present

## 2019-04-05 DIAGNOSIS — M069 Rheumatoid arthritis, unspecified: Secondary | ICD-10-CM | POA: Diagnosis not present

## 2019-04-05 DIAGNOSIS — Z7982 Long term (current) use of aspirin: Secondary | ICD-10-CM | POA: Diagnosis not present

## 2019-04-07 ENCOUNTER — Encounter (HOSPITAL_COMMUNITY): Payer: Self-pay

## 2019-04-07 DIAGNOSIS — Z7952 Long term (current) use of systemic steroids: Secondary | ICD-10-CM | POA: Diagnosis not present

## 2019-04-07 DIAGNOSIS — M0579 Rheumatoid arthritis with rheumatoid factor of multiple sites without organ or systems involvement: Secondary | ICD-10-CM | POA: Diagnosis not present

## 2019-04-07 DIAGNOSIS — D649 Anemia, unspecified: Secondary | ICD-10-CM | POA: Diagnosis not present

## 2019-04-07 DIAGNOSIS — M329 Systemic lupus erythematosus, unspecified: Secondary | ICD-10-CM | POA: Diagnosis not present

## 2019-04-09 ENCOUNTER — Encounter (HOSPITAL_COMMUNITY): Payer: Self-pay

## 2019-04-09 DIAGNOSIS — D688 Other specified coagulation defects: Secondary | ICD-10-CM | POA: Diagnosis not present

## 2019-04-09 DIAGNOSIS — I110 Hypertensive heart disease with heart failure: Secondary | ICD-10-CM | POA: Diagnosis not present

## 2019-04-09 DIAGNOSIS — M329 Systemic lupus erythematosus, unspecified: Secondary | ICD-10-CM | POA: Diagnosis not present

## 2019-04-09 DIAGNOSIS — D649 Anemia, unspecified: Secondary | ICD-10-CM | POA: Diagnosis not present

## 2019-04-09 DIAGNOSIS — G8929 Other chronic pain: Secondary | ICD-10-CM | POA: Diagnosis not present

## 2019-04-09 DIAGNOSIS — M351 Other overlap syndromes: Secondary | ICD-10-CM | POA: Diagnosis not present

## 2019-04-09 DIAGNOSIS — G43909 Migraine, unspecified, not intractable, without status migrainosus: Secondary | ICD-10-CM | POA: Diagnosis not present

## 2019-04-09 DIAGNOSIS — Z7982 Long term (current) use of aspirin: Secondary | ICD-10-CM | POA: Diagnosis not present

## 2019-04-09 DIAGNOSIS — I429 Cardiomyopathy, unspecified: Secondary | ICD-10-CM | POA: Diagnosis not present

## 2019-04-09 DIAGNOSIS — Z471 Aftercare following joint replacement surgery: Secondary | ICD-10-CM | POA: Diagnosis not present

## 2019-04-09 DIAGNOSIS — J45909 Unspecified asthma, uncomplicated: Secondary | ICD-10-CM | POA: Diagnosis not present

## 2019-04-09 DIAGNOSIS — M069 Rheumatoid arthritis, unspecified: Secondary | ICD-10-CM | POA: Diagnosis not present

## 2019-04-09 DIAGNOSIS — I5022 Chronic systolic (congestive) heart failure: Secondary | ICD-10-CM | POA: Diagnosis not present

## 2019-04-09 DIAGNOSIS — E119 Type 2 diabetes mellitus without complications: Secondary | ICD-10-CM | POA: Diagnosis not present

## 2019-04-09 DIAGNOSIS — G4733 Obstructive sleep apnea (adult) (pediatric): Secondary | ICD-10-CM | POA: Diagnosis not present

## 2019-04-09 DIAGNOSIS — M797 Fibromyalgia: Secondary | ICD-10-CM | POA: Diagnosis not present

## 2019-04-12 NOTE — Telephone Encounter (Signed)
Called pt to r/s telehealth visit. No answer.

## 2019-04-13 DIAGNOSIS — M069 Rheumatoid arthritis, unspecified: Secondary | ICD-10-CM | POA: Diagnosis not present

## 2019-04-13 DIAGNOSIS — I429 Cardiomyopathy, unspecified: Secondary | ICD-10-CM | POA: Diagnosis not present

## 2019-04-13 DIAGNOSIS — G8929 Other chronic pain: Secondary | ICD-10-CM | POA: Diagnosis not present

## 2019-04-13 DIAGNOSIS — I5022 Chronic systolic (congestive) heart failure: Secondary | ICD-10-CM | POA: Diagnosis not present

## 2019-04-13 DIAGNOSIS — M329 Systemic lupus erythematosus, unspecified: Secondary | ICD-10-CM | POA: Diagnosis not present

## 2019-04-13 DIAGNOSIS — E119 Type 2 diabetes mellitus without complications: Secondary | ICD-10-CM | POA: Diagnosis not present

## 2019-04-13 DIAGNOSIS — G43909 Migraine, unspecified, not intractable, without status migrainosus: Secondary | ICD-10-CM | POA: Diagnosis not present

## 2019-04-13 DIAGNOSIS — M797 Fibromyalgia: Secondary | ICD-10-CM | POA: Diagnosis not present

## 2019-04-13 DIAGNOSIS — J45909 Unspecified asthma, uncomplicated: Secondary | ICD-10-CM | POA: Diagnosis not present

## 2019-04-13 DIAGNOSIS — D688 Other specified coagulation defects: Secondary | ICD-10-CM | POA: Diagnosis not present

## 2019-04-13 DIAGNOSIS — M351 Other overlap syndromes: Secondary | ICD-10-CM | POA: Diagnosis not present

## 2019-04-13 DIAGNOSIS — I110 Hypertensive heart disease with heart failure: Secondary | ICD-10-CM | POA: Diagnosis not present

## 2019-04-13 DIAGNOSIS — G4733 Obstructive sleep apnea (adult) (pediatric): Secondary | ICD-10-CM | POA: Diagnosis not present

## 2019-04-13 DIAGNOSIS — Z7982 Long term (current) use of aspirin: Secondary | ICD-10-CM | POA: Diagnosis not present

## 2019-04-13 DIAGNOSIS — Z471 Aftercare following joint replacement surgery: Secondary | ICD-10-CM | POA: Diagnosis not present

## 2019-04-13 DIAGNOSIS — D649 Anemia, unspecified: Secondary | ICD-10-CM | POA: Diagnosis not present

## 2019-04-16 DIAGNOSIS — M329 Systemic lupus erythematosus, unspecified: Secondary | ICD-10-CM | POA: Diagnosis not present

## 2019-04-16 DIAGNOSIS — J45909 Unspecified asthma, uncomplicated: Secondary | ICD-10-CM | POA: Diagnosis not present

## 2019-04-16 DIAGNOSIS — E119 Type 2 diabetes mellitus without complications: Secondary | ICD-10-CM | POA: Diagnosis not present

## 2019-04-16 DIAGNOSIS — D688 Other specified coagulation defects: Secondary | ICD-10-CM | POA: Diagnosis not present

## 2019-04-16 DIAGNOSIS — G8929 Other chronic pain: Secondary | ICD-10-CM | POA: Diagnosis not present

## 2019-04-16 DIAGNOSIS — D649 Anemia, unspecified: Secondary | ICD-10-CM | POA: Diagnosis not present

## 2019-04-16 DIAGNOSIS — Z471 Aftercare following joint replacement surgery: Secondary | ICD-10-CM | POA: Diagnosis not present

## 2019-04-16 DIAGNOSIS — I110 Hypertensive heart disease with heart failure: Secondary | ICD-10-CM | POA: Diagnosis not present

## 2019-04-16 DIAGNOSIS — Z7982 Long term (current) use of aspirin: Secondary | ICD-10-CM | POA: Diagnosis not present

## 2019-04-16 DIAGNOSIS — G43909 Migraine, unspecified, not intractable, without status migrainosus: Secondary | ICD-10-CM | POA: Diagnosis not present

## 2019-04-16 DIAGNOSIS — I429 Cardiomyopathy, unspecified: Secondary | ICD-10-CM | POA: Diagnosis not present

## 2019-04-16 DIAGNOSIS — M069 Rheumatoid arthritis, unspecified: Secondary | ICD-10-CM | POA: Diagnosis not present

## 2019-04-16 DIAGNOSIS — E109 Type 1 diabetes mellitus without complications: Secondary | ICD-10-CM | POA: Diagnosis not present

## 2019-04-16 DIAGNOSIS — M351 Other overlap syndromes: Secondary | ICD-10-CM | POA: Diagnosis not present

## 2019-04-16 DIAGNOSIS — G4733 Obstructive sleep apnea (adult) (pediatric): Secondary | ICD-10-CM | POA: Diagnosis not present

## 2019-04-16 DIAGNOSIS — Z794 Long term (current) use of insulin: Secondary | ICD-10-CM | POA: Diagnosis not present

## 2019-04-16 DIAGNOSIS — I5022 Chronic systolic (congestive) heart failure: Secondary | ICD-10-CM | POA: Diagnosis not present

## 2019-04-16 DIAGNOSIS — M797 Fibromyalgia: Secondary | ICD-10-CM | POA: Diagnosis not present

## 2019-04-19 ENCOUNTER — Ambulatory Visit (INDEPENDENT_AMBULATORY_CARE_PROVIDER_SITE_OTHER): Payer: BLUE CROSS/BLUE SHIELD

## 2019-04-19 ENCOUNTER — Other Ambulatory Visit: Payer: Self-pay

## 2019-04-19 DIAGNOSIS — I5022 Chronic systolic (congestive) heart failure: Secondary | ICD-10-CM

## 2019-04-19 DIAGNOSIS — Z95810 Presence of automatic (implantable) cardiac defibrillator: Secondary | ICD-10-CM

## 2019-04-19 NOTE — Progress Notes (Signed)
EPIC Encounter for ICM Monitoring  Patient Name: Judythe Postema is a 48 y.o. female Date: 04/19/2019 Primary Care Physican: [name], MD Primary Cardiologist:Bensimhon Electrophysiologist:Taylor Lastweight198lbs  Attempted call to patient and unable to reach. Left message to return call. Transmission reviewed.  Recovering from 03/08/2019 TKR surgery.  HeartLogic Heart Failure Index is8suggesting normal but is trending up since 04/07/2019.   Prescribed:Torsemide20 mg take1tablet (20 mg total)daily and may increase to 60 mg daily for excess fluid.Metolazone 2.5 mg takeone tablet as needed for weight gain of 5 lbs within 3 days.   Labs: 02/16/2019 Creatinine 0.98, BUN 10, Potassium 3.6, Sodium 142, GFR >60 10/17/2018 Creatinine 1.10, BUN 15, Potassium 4.0, Sodium 140, GFR 58->60 08/18/2018 Creatinine 1.65, BUN 19, Potassium 3.1, Sodium 142, GFR 36-41 05/20/2018 Creatinine 1.03, BUN 14, Potassium 3.9, Sodium 140, GFR >60  Recommendations:None, unable to reach.  Follow-up plan: ICM clinic phone appointment on6/12/2018.Office appointment with Dr Haroldine Laws 06/17/2019.  Copy of ICM check sent to Dr. Lovena Le    [place], RN 04/19/2019 8:05 AM

## 2019-04-20 ENCOUNTER — Telehealth: Payer: Self-pay

## 2019-04-20 NOTE — Telephone Encounter (Signed)
Remote ICM transmission received.  Attempted call to patient regarding ICM remote transmission and left to return call.   ?

## 2019-04-21 DIAGNOSIS — D649 Anemia, unspecified: Secondary | ICD-10-CM | POA: Diagnosis not present

## 2019-04-21 DIAGNOSIS — M329 Systemic lupus erythematosus, unspecified: Secondary | ICD-10-CM | POA: Diagnosis not present

## 2019-04-21 DIAGNOSIS — M797 Fibromyalgia: Secondary | ICD-10-CM | POA: Diagnosis not present

## 2019-04-21 DIAGNOSIS — D688 Other specified coagulation defects: Secondary | ICD-10-CM | POA: Diagnosis not present

## 2019-04-21 DIAGNOSIS — I5022 Chronic systolic (congestive) heart failure: Secondary | ICD-10-CM | POA: Diagnosis not present

## 2019-04-21 DIAGNOSIS — M351 Other overlap syndromes: Secondary | ICD-10-CM | POA: Diagnosis not present

## 2019-04-21 DIAGNOSIS — Z471 Aftercare following joint replacement surgery: Secondary | ICD-10-CM | POA: Diagnosis not present

## 2019-04-21 DIAGNOSIS — G43909 Migraine, unspecified, not intractable, without status migrainosus: Secondary | ICD-10-CM | POA: Diagnosis not present

## 2019-04-21 DIAGNOSIS — M069 Rheumatoid arthritis, unspecified: Secondary | ICD-10-CM | POA: Diagnosis not present

## 2019-04-21 DIAGNOSIS — E119 Type 2 diabetes mellitus without complications: Secondary | ICD-10-CM | POA: Diagnosis not present

## 2019-04-21 DIAGNOSIS — J45909 Unspecified asthma, uncomplicated: Secondary | ICD-10-CM | POA: Diagnosis not present

## 2019-04-21 DIAGNOSIS — Z7982 Long term (current) use of aspirin: Secondary | ICD-10-CM | POA: Diagnosis not present

## 2019-04-21 DIAGNOSIS — G4733 Obstructive sleep apnea (adult) (pediatric): Secondary | ICD-10-CM | POA: Diagnosis not present

## 2019-04-21 DIAGNOSIS — G8929 Other chronic pain: Secondary | ICD-10-CM | POA: Diagnosis not present

## 2019-04-21 DIAGNOSIS — I110 Hypertensive heart disease with heart failure: Secondary | ICD-10-CM | POA: Diagnosis not present

## 2019-04-21 DIAGNOSIS — I429 Cardiomyopathy, unspecified: Secondary | ICD-10-CM | POA: Diagnosis not present

## 2019-04-22 ENCOUNTER — Telehealth (HOSPITAL_COMMUNITY): Payer: Self-pay

## 2019-04-22 NOTE — Telephone Encounter (Signed)
Received call from pt. Pt c/o "extremely low" bps of 109/68. Pt requested a telehealth visit. Pt c/o extreme fatigue. Advised that this is a normal blood pressure. Made telehealth visit for doxy video for 5/1.

## 2019-04-22 NOTE — Progress Notes (Addendum)
Heart Failure TeleHealth Note  Due to national recommendations of social distancing due to [place] 19, telehealth visit is felt to be most appropriate for this patient at this time.  I discussed the limitations, risks, security and privacy concerns of performing an evaluation and management service by telephone and the availability of in person appointments. I also discussed with the patient that there may be a patient responsible charge related to this service. The patient expressed understanding and agreed to proceed.   ID:  Christine Mcdowell, DOB [birth year], MRN [id number]  Location: Home  Provider location: [address] [place] Type of Visit: Established patient   PCP:  [name], MD  Cardiologist:  Glori Bickers, MD  Chief Complaint: Low blood pressure, fatigue   History of Present Illness: Christine Mcdowell is a 49 y.o. 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   Admitted in October 2017 with increased dyspnea and volume overload. Diuresed with IV lasix and transitioned to lasix 40 mg twice a day. LHC/RHC normal filling pressures, EF ~20%, and normal coronaries. Discharge weight 184 pounds.   Admitted to [facility] in September 2019  for ACL repair. Also received IV lasix during hospital admit.   She presents today for cardiac clearance for R TKA. She had an echo on 02/12/19, which showed EF 40-45%, RV normal. Overall doing fairly well. Still with mild DOE. No CP or significant edema. Says Her BP is low at times and she feels fatigued. No syncope.   Recent CPX 9/19 with relaltively normal spirometry. Mild HF limitation.   Patient presents via audio conferencing for a telehealth visit today. Last visit torsemide was decreased to every other day due to low BP and fatigue. Her Delene Loll has since been cut to  49/51 mg BID and she has not taken torsemide since knee surgery on March 17th. Her BP has been running 90-100s since she had knee surgery. She has lost 10-15 lbs on her scale, down to 180 lbs. Getting around a lot more with PT. She feels very fatigued with low BP and has associated dizziness. She only gets SOB when allergies bother her. She has associated sneezing and watery eyes. Does fine with HH PT. No edema other than knee that was operated on. No PND or orthopnea. Appetite is back now. No cough, fever, or chills. No longer has to take any insulin. She still has a continuous blood glucose monitor. BP 90/50s this morning and has not taken medications. Reviewed labs from 4/6: creatinine 1.15, K 5.2.   HeartLogic score 8 (sable). Thoracic impedence is trending down, but looks to be around baseline currently. No VT/VF. Active 2 hours daily.   Pt denies symptoms of cough, fevers, chills, or new SOB worrisome for COVID 19.    Echo 02/12/19 EF 40-45%  Cardiac MRI in 02/2015 showed EF 46%. No LGE.  Past Medical History:  Diagnosis Date  . AICD (automatic cardioverter/defibrillator) present 01/28/2018  . Anemia   . Anginal pain ([place])   . Asthma   . Cervical cancer ([place])   . CHF (congestive heart failure) ([place])   . Diabetes mellitus without complication ([place])    steroid induced  . Discoid lupus   . Fibromyalgia   . History of blood transfusion "several"   "related to anemia; had some w/hysterectomy also"  . Hx of cardiovascular stress test  ETT-Myoview (9/15):  No ischemia, EF 52%; NORMAL  . Hx of echocardiogram    Echo (9/15):  EF 50-55%, ant HK, Gr 1 DD, mild MR, mild LAE, no effusion  . Hypertension   . Iron deficiency anemia    h/o iron transfusions  . Lupus (systemic lupus erythematosus) ([place])   . Migraine    "a few/year" (07/03/2016)  . Pneumonia 12/2015  . RA (rheumatoid arthritis) ([place])    "all over" (07/03/2016)  . Sickle cell trait ([place])   . Stroke ([place]) 2014 X 1; 2015 X 2;  2016 X 1;    "right side of face more relaxed than the other; rare speech hesitation" (07/03/2016)  . Vaginal Pap smear, abnormal    ASCUS; HPV   Past Surgical History:  Procedure Laterality Date  . ABDOMINAL HYSTERECTOMY  2009  . ABDOMINAL WOUND DEHISCENCE  2009  . CARDIAC CATHETERIZATION N/A 10/11/2016   Procedure: Right/Left Heart Cath and Coronary Angiography;  Surgeon: Jolaine Artist, MD;  Location: [place] [facility];  Service: Cardiovascular;  Laterality: N/A;  . DILATION AND CURETTAGE OF UTERUS  1991  . HEMATOMA EVACUATION  2009   abdomen  . ICD IMPLANT  01/28/2018  . ICD IMPLANT N/A 01/28/2018   Procedure: ICD IMPLANT;  Surgeon: Evans Lance, MD;  Location: [place] [facility];  Service: Cardiovascular;  Laterality: N/A;  . INCISE AND DRAIN ABCESS  2009 X 2   "abdomen after hysterectomy"  . KNEE ARTHROSCOPY Right 1997  . KNEE SURGERY Right   . TUBAL LIGATION  1996     Current Outpatient Medications  Medication Sig Dispense Refill  . ACCU-CHEK FASTCLIX LANCETS MISC USE TO PRICK FINGER TO TEST BLOOD SUGAR BEFORE BREAKFAST AND DINNER  10  . albuterol (ACCUNEB) 1.25 MG/3ML nebulizer solution Take 3 mLs (1.25 mg total) by nebulization every 6 (six) hours as needed for wheezing. 75 mL 12  . albuterol (PROVENTIL HFA;VENTOLIN HFA) 108 (90 Base) MCG/ACT inhaler Inhale 2 puffs into the lungs daily as needed for shortness of breath. 1 Inhaler 0  . aspirin 325 MG EC tablet Take 325 mg by mouth 2 (two) times a day. For 6 weeks after knee surgery, then back to 162 mg daily    . azelastine (ASTELIN) 0.1 % nasal spray Place 1 spray into both nostrils 2 (two) times daily. Use in each nostril as directed    . Belimumab (BENLYSTA) 200 MG/ML SOAJ Inject 200 mcg into the skin once a week.    . carvedilol (COREG) 25 MG tablet TAKE 1 TABLET(25 MG) BY MOUTH TWICE DAILY WITH A MEAL 180 tablet 0  . cromolyn (OPTICROM) 4 % ophthalmic solution Place 1 drop into both eyes daily.  0  . folic acid  (FOLVITE) 1 MG tablet TAKE 1 TABLET BY MOUTH EVERY MORNING (Patient taking differently: Take 1 mg by mouth daily. ) 30 tablet 0  . hydrALAZINE (APRESOLINE) 50 MG tablet TAKE 2 TABLETS(100 MG) BY MOUTH THREE TIMES DAILY 540 tablet 0  . hydroxychloroquine (PLAQUENIL) 200 MG tablet Take 1 tablet (200 mg total) by mouth 2 (two) times daily. 180 tablet 0  . metFORMIN (GLUCOPHAGE) 500 MG tablet Take 500 mg by mouth daily with breakfast.     . methotrexate (RHEUMATREX) 2.5 MG tablet Take 20 mg by mouth every Friday.   3  . ondansetron (ZOFRAN) 4 MG tablet Take 1 tablet (4 mg total) by mouth every 8 (eight) hours as needed for nausea or vomiting. 20 tablet  0  . pantoprazole (PROTONIX) 40 MG tablet TAKE 1 TABLET(40 MG) BY MOUTH DAILY 30 TO 60 MINUTES BEFORE FIRST MEAL OF THE DAY 30 tablet 3  . predniSONE (DELTASONE) 5 MG tablet Take 5 mg by mouth daily with breakfast.     . sacubitril-valsartan (ENTRESTO) 49-51 MG Take 1 tablet by mouth 2 (two) times daily. 180 tablet 0  . spironolactone (ALDACTONE) 25 MG tablet Take 1 tablet (25 mg total) by mouth 2 (two) times daily. 180 tablet 0  . Vitamin D, Ergocalciferol, (DRISDOL) 50000 units CAPS capsule TAKE 1 CAPSULE BY MOUTH TWICE WEEKLY(ON TUESDAY AND FRIDAY) (Patient taking differently: Take 50,000 Units by mouth 2 (two) times a week. Every Tuesday and Friday) 8 capsule 0  . meloxicam (MOBIC) 15 MG tablet Take 15 mg by mouth daily.     . metolazone (ZAROXOLYN) 2.5 MG tablet TAKE 1 TABLET BY MOUTH AS NEEDED FOR WEIGHT GAIN OF 5 POUNDS WITHIN 3 DAYS AS DIRECTED (Patient not taking: Reported on 04/23/2019) 5 tablet 3  . Multiple Vitamin (MULTIVITAMIN WITH MINERALS) TABS Take 1 tablet by mouth every morning.     Marland Kitchen oxyCODONE (OXY IR/ROXICODONE) 5 MG immediate release tablet Take 5 mg by mouth every 4 (four) hours as needed for severe pain.    Marland Kitchen sennosides-docusate sodium (SENOKOT-S) 8.6-50 MG tablet Take 1 tablet by mouth daily.    Marland Kitchen torsemide (DEMADEX) 20 MG tablet  Take 1 tablet (20 mg total) by mouth daily. May increase to 60mg  if excess fluid (Patient not taking: Reported on 04/23/2019) 90 tablet 0   No current facility-administered medications for this encounter.     Allergies:   Hydromorphone; Iodinated diagnostic agents; Other; Erythromycin; Latex; Tape; and Mircette [desogestrel-ethinyl estradiol]   Social History:  The patient  reports that she has never smoked. She has never used smokeless tobacco. She reports that she does not drink alcohol or use drugs.   Family History:  The patient's family history includes Allergies in her father and mother; Arthritis in her mother; Asthma in her maternal grandmother; Cancer in her paternal grandfather; Cushing syndrome in her father; Dementia in her paternal grandmother; Depression in her father; Diabetes in her maternal grandmother; Drug abuse in her mother; Heart attack in her father and maternal grandfather; Heart murmur in her mother; Hypertension in her maternal grandmother.   ROS:  Please see the history of present illness.   All other systems are personally reviewed and negative.    Exam:  (Video/Tele Health Call; Exam is subjective and or/visual.) General:  Speaks in full sentences. No resp difficulty. Lungs: Normal respiratory effort with conversation.  Abdomen: No distension per patient report Extremities: Pt denies edema. Neuro: Alert & oriented x 3.   Recent Labs: 05/20/2018: Magnesium 1.6 10/17/2018: B Natriuretic Peptide 72.3 02/16/2019: Hemoglobin 10.4; Platelets 234 03/29/2019: ALT 11; BUN 14; Creatinine, Ser 1.15; Potassium 5.2; Sodium 137; TSH 0.750  Personally reviewed   Wt Readings from Last 3 Encounters:  03/29/19 84.4 kg (186 lb)  02/16/19 86.8 kg (191 lb 6 oz)  10/09/18 87.3 kg (192 lb 6.4 oz)      ASSESSMENT AND PLAN:  1. Chronic Systolic Heart Failure - NICM Cath 10/11/16 with normal cors. CPX 10/2016: Peak VO2: 18.5 (82% predicted peak VO2), VE/VCO2 slope: 30. ECHO 12/18 EF  25-30%  - S/P [employer] HeartLogic ICD 01/2018.  - Cardiac MRI in 02/2015 showed EF 46%. No LGE. - Repeat CPX 9/19 with mild HR limitation - Echo 02/12/19: EF 40-45% -  Improved NYHA II - Volume status sounds okay. She is losing weight due to knee surgery and ability to move around more, down 10 lbs so far. Thoracic impedence is trending down now, but around baseline right now. Has not had torsemide since mid March. Will change her torsemide to 20 mg PRN. Discussed s/s to take this.  - Decrease spiro to 25 mg qHS. Last K was 5.2.  - Continue coreg 25 mg BID - Continue Entresto 49/51 mg BID for now with impedence trending down. - Decrease hydralazine to 75 mg TID. Not on imdur due to headaches. May need to cut back more if she remains hypotensive. - She has been consented for cardiomems, but will plan to monitor her with HeartLogic device for now unless she has frequent hospitalizations for CHF 2. Sinus tach vs atrial flutter on ICD - Follows with Dr. Lovena Le. Now resolved.  3. HTN -> Hypotension - Now running low. See above.  4. OSA - Continue nightly CPAP. Not wearing due to allergies. Has to wear 30 days before she can get mask replaced. May not need to wear if she keeps losing weight. Ill if we can get f/u with Dr Radford Pax.    COVID screen The patient does not have any symptoms that suggest any further testing/ screening at this time.  Social distancing reinforced today.  Patient Risk: After full review of this patients clinical status, I feel that they are at moderate risk for cardiac decompensation at this time.  Orders/Follow up: Change torsemide to 20 mg PRN. Decrease spiro to 25 mg daily. Decrease hydralazine to 75 mg TID. Needs refill of Entresto 49/51 and hydralazine (90 day supply) [facility] [place] on [address]. Can we request f/u with Dr Radford Pax to discuss a different face mask vs repeat sleep study now that she is losing weight? Follow up with me in 2-3 weeks.   Today,  I have spent 26 minutes with the patient with telehealth technology discussing the above issues.    Signed, Georgiana Shore, NP  04/23/2019 11:34 AM   [facility] [address] [facility] [place] [address] [phone] (office) [phone] (fax)

## 2019-04-23 ENCOUNTER — Encounter (HOSPITAL_COMMUNITY): Payer: Self-pay

## 2019-04-23 ENCOUNTER — Ambulatory Visit (HOSPITAL_COMMUNITY)
Admission: RE | Admit: 2019-04-23 | Discharge: 2019-04-23 | Disposition: A | Discharge status: Home / Self Care | Payer: BLUE CROSS/BLUE SHIELD | Source: Ambulatory Visit | Attending: Internal Medicine | Admitting: Internal Medicine

## 2019-04-23 ENCOUNTER — Other Ambulatory Visit: Payer: Self-pay

## 2019-04-23 DIAGNOSIS — M351 Other overlap syndromes: Secondary | ICD-10-CM | POA: Diagnosis not present

## 2019-04-23 DIAGNOSIS — D649 Anemia, unspecified: Secondary | ICD-10-CM | POA: Diagnosis not present

## 2019-04-23 DIAGNOSIS — I110 Hypertensive heart disease with heart failure: Secondary | ICD-10-CM | POA: Diagnosis not present

## 2019-04-23 DIAGNOSIS — I2510 Atherosclerotic heart disease of native coronary artery without angina pectoris: Secondary | ICD-10-CM

## 2019-04-23 DIAGNOSIS — M797 Fibromyalgia: Secondary | ICD-10-CM | POA: Diagnosis not present

## 2019-04-23 DIAGNOSIS — Z471 Aftercare following joint replacement surgery: Secondary | ICD-10-CM | POA: Diagnosis not present

## 2019-04-23 DIAGNOSIS — E119 Type 2 diabetes mellitus without complications: Secondary | ICD-10-CM | POA: Diagnosis not present

## 2019-04-23 DIAGNOSIS — G4730 Sleep apnea, unspecified: Secondary | ICD-10-CM | POA: Diagnosis not present

## 2019-04-23 DIAGNOSIS — G4733 Obstructive sleep apnea (adult) (pediatric): Secondary | ICD-10-CM | POA: Diagnosis not present

## 2019-04-23 DIAGNOSIS — I959 Hypotension, unspecified: Secondary | ICD-10-CM

## 2019-04-23 DIAGNOSIS — Z7982 Long term (current) use of aspirin: Secondary | ICD-10-CM | POA: Diagnosis not present

## 2019-04-23 DIAGNOSIS — I5022 Chronic systolic (congestive) heart failure: Secondary | ICD-10-CM | POA: Diagnosis not present

## 2019-04-23 DIAGNOSIS — Z95810 Presence of automatic (implantable) cardiac defibrillator: Secondary | ICD-10-CM

## 2019-04-23 DIAGNOSIS — G43909 Migraine, unspecified, not intractable, without status migrainosus: Secondary | ICD-10-CM | POA: Diagnosis not present

## 2019-04-23 DIAGNOSIS — J45909 Unspecified asthma, uncomplicated: Secondary | ICD-10-CM | POA: Diagnosis not present

## 2019-04-23 DIAGNOSIS — M069 Rheumatoid arthritis, unspecified: Secondary | ICD-10-CM | POA: Diagnosis not present

## 2019-04-23 DIAGNOSIS — M329 Systemic lupus erythematosus, unspecified: Secondary | ICD-10-CM | POA: Diagnosis not present

## 2019-04-23 DIAGNOSIS — D688 Other specified coagulation defects: Secondary | ICD-10-CM | POA: Diagnosis not present

## 2019-04-23 DIAGNOSIS — G8929 Other chronic pain: Secondary | ICD-10-CM | POA: Diagnosis not present

## 2019-04-23 DIAGNOSIS — I429 Cardiomyopathy, unspecified: Secondary | ICD-10-CM | POA: Diagnosis not present

## 2019-04-23 MED ORDER — SACUBITRIL-VALSARTAN 49-51 MG PO TABS: 1.0000 | ORAL_TABLET | Freq: Two times a day (BID) | ORAL | 3 refills | Status: DC

## 2019-04-23 MED ORDER — TORSEMIDE 20 MG PO TABS: 20.0000 mg | ORAL_TABLET | ORAL | 0 refills | Status: DC | PRN

## 2019-04-23 MED ORDER — HYDRALAZINE HCL 50 MG PO TABS: 75.0000 mg | ORAL_TABLET | Freq: Three times a day (TID) | ORAL | 3 refills | Status: DC

## 2019-04-23 MED ORDER — SPIRONOLACTONE 25 MG PO TABS: 25.0000 mg | ORAL_TABLET | Freq: Every evening | ORAL | 3 refills | Status: DC

## 2019-04-23 NOTE — Progress Notes (Signed)
Spoke to pt to review avs instructions. Pt verbalized understanding. avs sent via mychart.

## 2019-04-23 NOTE — Addendum Note (Signed)
Encounter addended by: Marlise Eves, RN on: 04/23/2019 12:23 PM  Actions taken: Order list changed, Clinical Note Signed

## 2019-04-23 NOTE — Patient Instructions (Addendum)
CHANGE Torsemide to 20mg  as needed  DECREASE Spironolactone 25mg  every evening.  DECREASE Hydralazine 75mg  (1.5 tabs) three times daily.  REFILLED Entresto 49/51 and Hydralazine.  Please follow up with Dr. Theodosia Blender office to discuss a different face mask. We have sent in a referral for a re-evaluation.   Please follow up with the Advanced Practice Provider in 2 weeks. This is scheduled for May 15th at 11:00am

## 2019-04-26 ENCOUNTER — Other Ambulatory Visit: Payer: Self-pay

## 2019-04-26 MED ORDER — DEXCOM G6 RECEIVER DEVI: 1.0000 | Freq: Three times a day (TID) | 0 refills | Status: DC

## 2019-04-26 MED ORDER — DEXCOM G6 SENSOR MISC: 1.0000 | Freq: Three times a day (TID) | 1 refills | Status: DC

## 2019-04-27 DIAGNOSIS — E119 Type 2 diabetes mellitus without complications: Secondary | ICD-10-CM | POA: Diagnosis not present

## 2019-04-27 DIAGNOSIS — G43909 Migraine, unspecified, not intractable, without status migrainosus: Secondary | ICD-10-CM | POA: Diagnosis not present

## 2019-04-27 DIAGNOSIS — D649 Anemia, unspecified: Secondary | ICD-10-CM | POA: Diagnosis not present

## 2019-04-27 DIAGNOSIS — I429 Cardiomyopathy, unspecified: Secondary | ICD-10-CM | POA: Diagnosis not present

## 2019-04-27 DIAGNOSIS — J45909 Unspecified asthma, uncomplicated: Secondary | ICD-10-CM | POA: Diagnosis not present

## 2019-04-27 DIAGNOSIS — Z471 Aftercare following joint replacement surgery: Secondary | ICD-10-CM | POA: Diagnosis not present

## 2019-04-27 DIAGNOSIS — I5022 Chronic systolic (congestive) heart failure: Secondary | ICD-10-CM | POA: Diagnosis not present

## 2019-04-27 DIAGNOSIS — M329 Systemic lupus erythematosus, unspecified: Secondary | ICD-10-CM | POA: Diagnosis not present

## 2019-04-27 DIAGNOSIS — Z7982 Long term (current) use of aspirin: Secondary | ICD-10-CM | POA: Diagnosis not present

## 2019-04-27 DIAGNOSIS — D688 Other specified coagulation defects: Secondary | ICD-10-CM | POA: Diagnosis not present

## 2019-04-27 DIAGNOSIS — I110 Hypertensive heart disease with heart failure: Secondary | ICD-10-CM | POA: Diagnosis not present

## 2019-04-27 DIAGNOSIS — M069 Rheumatoid arthritis, unspecified: Secondary | ICD-10-CM | POA: Diagnosis not present

## 2019-04-27 DIAGNOSIS — G4733 Obstructive sleep apnea (adult) (pediatric): Secondary | ICD-10-CM | POA: Diagnosis not present

## 2019-04-27 DIAGNOSIS — M351 Other overlap syndromes: Secondary | ICD-10-CM | POA: Diagnosis not present

## 2019-04-27 DIAGNOSIS — G8929 Other chronic pain: Secondary | ICD-10-CM | POA: Diagnosis not present

## 2019-04-27 DIAGNOSIS — M797 Fibromyalgia: Secondary | ICD-10-CM | POA: Diagnosis not present

## 2019-04-29 DIAGNOSIS — D688 Other specified coagulation defects: Secondary | ICD-10-CM | POA: Diagnosis not present

## 2019-04-29 DIAGNOSIS — M351 Other overlap syndromes: Secondary | ICD-10-CM | POA: Diagnosis not present

## 2019-04-29 DIAGNOSIS — M797 Fibromyalgia: Secondary | ICD-10-CM | POA: Diagnosis not present

## 2019-04-29 DIAGNOSIS — G43909 Migraine, unspecified, not intractable, without status migrainosus: Secondary | ICD-10-CM | POA: Diagnosis not present

## 2019-04-29 DIAGNOSIS — E119 Type 2 diabetes mellitus without complications: Secondary | ICD-10-CM | POA: Diagnosis not present

## 2019-04-29 DIAGNOSIS — Z471 Aftercare following joint replacement surgery: Secondary | ICD-10-CM | POA: Diagnosis not present

## 2019-04-29 DIAGNOSIS — Z7982 Long term (current) use of aspirin: Secondary | ICD-10-CM | POA: Diagnosis not present

## 2019-04-29 DIAGNOSIS — D649 Anemia, unspecified: Secondary | ICD-10-CM | POA: Diagnosis not present

## 2019-04-29 DIAGNOSIS — G8929 Other chronic pain: Secondary | ICD-10-CM | POA: Diagnosis not present

## 2019-04-29 DIAGNOSIS — G4733 Obstructive sleep apnea (adult) (pediatric): Secondary | ICD-10-CM | POA: Diagnosis not present

## 2019-04-29 DIAGNOSIS — M329 Systemic lupus erythematosus, unspecified: Secondary | ICD-10-CM | POA: Diagnosis not present

## 2019-04-29 DIAGNOSIS — M069 Rheumatoid arthritis, unspecified: Secondary | ICD-10-CM | POA: Diagnosis not present

## 2019-04-29 DIAGNOSIS — I110 Hypertensive heart disease with heart failure: Secondary | ICD-10-CM | POA: Diagnosis not present

## 2019-04-29 DIAGNOSIS — J45909 Unspecified asthma, uncomplicated: Secondary | ICD-10-CM | POA: Diagnosis not present

## 2019-04-29 DIAGNOSIS — I5022 Chronic systolic (congestive) heart failure: Secondary | ICD-10-CM | POA: Diagnosis not present

## 2019-04-29 DIAGNOSIS — I429 Cardiomyopathy, unspecified: Secondary | ICD-10-CM | POA: Diagnosis not present

## 2019-04-30 ENCOUNTER — Encounter: Payer: Self-pay | Admitting: Nurse Practitioner

## 2019-04-30 ENCOUNTER — Other Ambulatory Visit: Payer: Self-pay | Admitting: Nurse Practitioner

## 2019-05-03 ENCOUNTER — Other Ambulatory Visit: Payer: Self-pay | Admitting: Nurse Practitioner

## 2019-05-03 ENCOUNTER — Other Ambulatory Visit: Payer: Self-pay

## 2019-05-03 MED ORDER — DEXCOM G6 TRANSMITTER MISC: 1.0000 | Freq: Three times a day (TID) | 1 refills | Status: DC

## 2019-05-03 NOTE — Telephone Encounter (Signed)
Please call pharmacy to see what they need.  Thanks.

## 2019-05-04 ENCOUNTER — Other Ambulatory Visit: Payer: Self-pay | Admitting: Nurse Practitioner

## 2019-05-04 DIAGNOSIS — J45909 Unspecified asthma, uncomplicated: Secondary | ICD-10-CM | POA: Diagnosis not present

## 2019-05-04 DIAGNOSIS — E119 Type 2 diabetes mellitus without complications: Secondary | ICD-10-CM | POA: Diagnosis not present

## 2019-05-04 DIAGNOSIS — M351 Other overlap syndromes: Secondary | ICD-10-CM | POA: Diagnosis not present

## 2019-05-04 DIAGNOSIS — Z7982 Long term (current) use of aspirin: Secondary | ICD-10-CM | POA: Diagnosis not present

## 2019-05-04 DIAGNOSIS — M069 Rheumatoid arthritis, unspecified: Secondary | ICD-10-CM | POA: Diagnosis not present

## 2019-05-04 DIAGNOSIS — D649 Anemia, unspecified: Secondary | ICD-10-CM | POA: Diagnosis not present

## 2019-05-04 DIAGNOSIS — G43909 Migraine, unspecified, not intractable, without status migrainosus: Secondary | ICD-10-CM | POA: Diagnosis not present

## 2019-05-04 DIAGNOSIS — M329 Systemic lupus erythematosus, unspecified: Secondary | ICD-10-CM | POA: Diagnosis not present

## 2019-05-04 DIAGNOSIS — G4733 Obstructive sleep apnea (adult) (pediatric): Secondary | ICD-10-CM | POA: Diagnosis not present

## 2019-05-04 DIAGNOSIS — I5022 Chronic systolic (congestive) heart failure: Secondary | ICD-10-CM | POA: Diagnosis not present

## 2019-05-04 DIAGNOSIS — Z471 Aftercare following joint replacement surgery: Secondary | ICD-10-CM | POA: Diagnosis not present

## 2019-05-04 DIAGNOSIS — I110 Hypertensive heart disease with heart failure: Secondary | ICD-10-CM | POA: Diagnosis not present

## 2019-05-04 DIAGNOSIS — D688 Other specified coagulation defects: Secondary | ICD-10-CM | POA: Diagnosis not present

## 2019-05-04 DIAGNOSIS — M797 Fibromyalgia: Secondary | ICD-10-CM | POA: Diagnosis not present

## 2019-05-04 DIAGNOSIS — G8929 Other chronic pain: Secondary | ICD-10-CM | POA: Diagnosis not present

## 2019-05-04 DIAGNOSIS — I429 Cardiomyopathy, unspecified: Secondary | ICD-10-CM | POA: Diagnosis not present

## 2019-05-06 NOTE — Progress Notes (Signed)
Heart Failure TeleHealth Note  Due to national recommendations of social distancing due to [place] 19, telehealth visit is felt to be most appropriate for this patient at this time.  I discussed the limitations, risks, security and privacy concerns of performing an evaluation and management service by telephone and the availability of in person appointments. I also discussed with the patient that there may be a patient responsible charge related to this service. The patient expressed understanding and agreed to proceed.   ID:  Christine Mcdowell, DOB [birth year], MRN [id number]  Location: Home  Provider location: [address], [place] Type of Visit: Established patient   PCP:  [name], MD  Cardiologist:  Glori Bickers, MD Primary HF: Dr Haroldine Laws  Chief Complaint: Heart failure   History of Present Illness: Christine Mcdowell is a 48 y.o. female with a history of lupus with associated with presumed myocarditis/cardiomyopathy (diagnosed in 01/2014, EF 35-40%), HTN, HLD, Type 2 DM, Fibromyalgia, and four self-reported CVA's.   Admitted in October 2017 with increased dyspnea and volume overload. Diuresed with IV lasix and transitioned to lasix 40 mg twice a day. LHC/RHC normal filling pressures, EF ~20%, and normal coronaries. Discharge weight 184 pounds.   Admitted to [facility] in September 2019 for ACL repair. Also received IV lasix during hospital admit.   She presents today for cardiac clearance forR TKA. She had an echo on 02/12/19, which showed EF 40-45%, RV normal. Overall doing fairly well. Still with mild DOE. No CP or significant edema. Says Her BP is low at times and she feels fatigued. No syncope.  Recent CPX 9/19 with relaltively normal spirometry. Mild HF limitation.   Patient presents via audio conferencing for a telehealth visit today. Last visit torsemide changed to PRN and hydralazine decreased due to hypotension. Overall doing much better. SBP  now 110s. Energy level is a lot better. Appetite is good. No SOB, edema, orthopnea, or edema. No dizziness or CP. She has [facility] coming to the house. Weighing daily, weight    HeartLogic Score: 8, thoracic impedence trending down, avg HR 86, active 1.8 hours/day, No VT or NSVT since January.   Pt denies symptoms of cough, fevers, chills, or new SOB worrisome for COVID 19.    Echo 02/12/19 EF 40-45%  Cardiac MRI in 02/2015 showed EF 46%. No LGE.  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   Anemia    Anginal pain (HCC)    Asthma    Cervical cancer (HCC)    CHF (congestive heart failure) ([place])    Diabetes mellitus without complication ([place])    steroid induced   Discoid lupus    Fibromyalgia    History of blood transfusion "several"   "related to anemia; had some w/hysterectomy also"   Hx of cardiovascular stress test    ETT-Myoview (9/15):  No ischemia, EF 52%; NORMAL   Hx of echocardiogram    Echo (9/15):  EF 50-55%, ant HK, Gr 1 DD, mild MR, mild LAE, no effusion   Hypertension    Iron deficiency anemia    h/o iron transfusions   Lupus (systemic lupus erythematosus) ([place])    Migraine    "a few/year" (07/03/2016)   Pneumonia 12/2015   RA (rheumatoid arthritis) ([place])    "all over" (07/03/2016)   Sickle cell trait ([place])    Stroke ([place]) 2014 X 1; 2015 X 2; 2016 X 1;    "right side of face  more relaxed than the other; rare speech hesitation" (07/03/2016)   Vaginal Pap smear, abnormal    ASCUS; HPV   Past Surgical History:  Procedure Laterality Date   ABDOMINAL HYSTERECTOMY  2009   ABDOMINAL WOUND DEHISCENCE  2009   CARDIAC CATHETERIZATION N/A 10/11/2016   Procedure: Right/Left Heart Cath and Coronary Angiography;  Surgeon: Jolaine Artist, MD;  Location: [facility];  Service: Cardiovascular;  Laterality: N/A;   DILATION AND CURETTAGE OF UTERUS  1991   HEMATOMA EVACUATION  2009    abdomen   ICD IMPLANT  01/28/2018   ICD IMPLANT N/A 01/28/2018   Procedure: ICD IMPLANT;  Surgeon: Evans Lance, MD;  Location: [facility];  Service: Cardiovascular;  Laterality: N/A;   INCISE AND DRAIN ABCESS  2009 X 2   "abdomen after hysterectomy"   KNEE ARTHROSCOPY Right 1997   KNEE SURGERY Right    TUBAL LIGATION  1996     Current Outpatient Medications  Medication Sig Dispense Refill   carvedilol (COREG) 25 MG tablet TAKE 1 TABLET(25 MG) BY MOUTH TWICE DAILY WITH A MEAL 180 tablet 0   hydrALAZINE (APRESOLINE) 50 MG tablet Take 1.5 tablets (75 mg total) by mouth 3 (three) times daily. 405 tablet 3   sacubitril-valsartan (ENTRESTO) 49-51 MG Take 1 tablet by mouth 2 (two) times daily. 180 tablet 3   spironolactone (ALDACTONE) 25 MG tablet Take 1 tablet (25 mg total) by mouth every evening. 90 tablet 3   ACCU-CHEK FASTCLIX LANCETS MISC USE TO PRICK FINGER TO TEST BLOOD SUGAR BEFORE BREAKFAST AND DINNER  10   albuterol (ACCUNEB) 1.25 MG/3ML nebulizer solution Take 3 mLs (1.25 mg total) by nebulization every 6 (six) hours as needed for wheezing. 75 mL 12   aspirin 325 MG EC tablet Take 325 mg by mouth 2 (two) times a day. For 6 weeks after knee surgery, then back to 162 mg daily     azelastine (ASTELIN) 0.1 % nasal spray Place 1 spray into both nostrils 2 (two) times daily. Use in each nostril as directed     Belimumab (BENLYSTA) 200 MG/ML SOAJ Inject 200 mcg into the skin once a week.     Continuous Blood Gluc Receiver (DEXCOM G6 RECEIVER) DEVI 1 Device by Does not apply route 3 (three) times daily before meals. 1 Device 0   Continuous Blood Gluc Sensor (DEXCOM G6 SENSOR) MISC 1 Device by Does not apply route 3 (three) times daily before meals. 1 each 1   Continuous Blood Gluc Transmit (DEXCOM G6 TRANSMITTER) MISC 1 Device by Does not apply route 3 (three) times daily before meals. 1 each 1   cromolyn (OPTICROM) 4 % ophthalmic solution Place 1 drop into both  eyes daily.  0   folic acid (FOLVITE) 1 MG tablet TAKE 1 TABLET BY MOUTH EVERY MORNING (Patient taking differently: Take 1 mg by mouth daily. ) 30 tablet 0   hydroxychloroquine (PLAQUENIL) 200 MG tablet Take 1 tablet (200 mg total) by mouth 2 (two) times daily. 180 tablet 0   meloxicam (MOBIC) 15 MG tablet Take 15 mg by mouth daily.      metFORMIN (GLUCOPHAGE) 500 MG tablet TAKE 1 TABLET BY MOUTH EVERY DAY WITH THE MORNING AND EVENING MEAL 180 tablet 3   methotrexate (RHEUMATREX) 2.5 MG tablet Take 20 mg by mouth every Friday.   3   metolazone (ZAROXOLYN) 2.5 MG tablet TAKE 1 TABLET BY MOUTH AS NEEDED FOR WEIGHT GAIN OF 5 POUNDS WITHIN  3 DAYS AS DIRECTED (Patient not taking: Reported on 04/23/2019) 5 tablet 3   Multiple Vitamin (MULTIVITAMIN WITH MINERALS) TABS Take 1 tablet by mouth every morning.      ondansetron (ZOFRAN) 4 MG tablet Take 1 tablet (4 mg total) by mouth every 8 (eight) hours as needed for nausea or vomiting. 20 tablet 0   oxyCODONE (OXY IR/ROXICODONE) 5 MG immediate release tablet Take 5 mg by mouth every 4 (four) hours as needed for severe pain.     pantoprazole (PROTONIX) 40 MG tablet TAKE 1 TABLET(40 MG) BY MOUTH DAILY 30 TO 60 MINUTES BEFORE FIRST MEAL OF THE DAY 30 tablet 3   predniSONE (DELTASONE) 5 MG tablet Take 5 mg by mouth daily with breakfast.      PROAIR HFA 108 (90 Base) MCG/ACT inhaler INHALE 2 PUFFS EVERY DAY AS NEEDED FOR SHORTNESS OF BREATH 8.5 g 0   sennosides-docusate sodium (SENOKOT-S) 8.6-50 MG tablet Take 1 tablet by mouth daily.     torsemide (DEMADEX) 20 MG tablet Take 1 tablet (20 mg total) by mouth as needed. May increase to 60mg  if excess fluid (Patient not taking: Reported on 05/07/2019) 90 tablet 0   Vitamin D, Ergocalciferol, (DRISDOL) 50000 units CAPS capsule TAKE 1 CAPSULE BY MOUTH TWICE WEEKLY(ON TUESDAY AND FRIDAY) (Patient taking differently: Take 50,000 Units by mouth 2 (two) times a week. Every Tuesday and Friday) 8 capsule 0   No  current facility-administered medications for this encounter.     Allergies:   Hydromorphone; Iodinated diagnostic agents; Other; Erythromycin; Latex; Tape; and Mircette [desogestrel-ethinyl estradiol]   Social History:  The patient  reports that she has never smoked. She has never used smokeless tobacco. She reports that she does not drink alcohol or use drugs.   Family History:  The patient's family history includes Allergies in her father and mother; Arthritis in her mother; Asthma in her maternal grandmother; Cancer in her paternal grandfather; Cushing syndrome in her father; Dementia in her paternal grandmother; Depression in her father; Diabetes in her maternal grandmother; Drug abuse in her mother; Heart attack in her father and maternal grandfather; Heart murmur in her mother; Hypertension in her maternal grandmother.   ROS:  Please see the history of present illness.   All other systems are personally reviewed and negative.    Exam:  (Video/Tele Health Call; Exam is subjective and or/visual.) General:  Speaks in full sentences. No resp difficulty. Lungs: Normal respiratory effort with conversation.  Abdomen: No distension per patient report Extremities: Pt denies edema. Neuro: Alert & oriented x 3.   Recent Labs: 05/20/2018: Magnesium 1.6 10/17/2018: B Natriuretic Peptide 72.3 02/16/2019: Hemoglobin 10.4; Platelets 234 03/29/2019: ALT 11; BUN 14; Creatinine, Ser 1.15; Potassium 5.2; Sodium 137; TSH 0.750  Personally reviewed   Wt Readings from Last 3 Encounters:  05/07/19 83.7 kg (184 lb 8 oz)  03/29/19 84.4 kg (186 lb)  02/16/19 86.8 kg (191 lb 6 oz)      ASSESSMENT AND PLAN:  1. Chronic Systolic Heart Failure - NICM Cath 10/11/16 with normal cors. CPX 10/2016: Peak VO2: 18.5 (82% predicted peak VO2), VE/VCO2 slope: 30. ECHO 12/18 EF 25-30%  - S/P [employer] HeartLogic ICD 01/2018.  - Cardiac MRI in 02/2015 showed EF 46%. No LGE. - Repeat CPX 9/19 with mild HF  limitation - Echo 02/12/19: EF 40-45% -Improved NYHA II - Volume status elevated on HeartLogic and weight is up 5 lbs.  - Continue torsemide 20 mg PRN. Discussed s/s to take  this. I asked her to take today.  - Continue spiro 25 mg qHS. - Continue coreg 25 mg BID  -ContinueEntresto 49/51 mg BID  - Continue hydralazine 75 mg TID. Not on imdur due to headaches.  - She has been consented for cardiomems, but will plan to monitor her with HeartLogic device for now unless she has frequent hospitalizations for CHF 2. Sinus tach vs atrial flutter on ICD - Follows with Dr. Lovena Le. Now resolved.  3. HTN -> Hypotension -Now stable 110s 4. OSA - Not wearing CPAP due to allergies. Has to wear 30 days before she can get mask replaced. May not need to wear if she keeps losing weight. Needs to f/u with Dr Radford Pax. I will provide her the phone number today.    COVID screen The patient does not have any symptoms that suggest any further testing/ screening at this time.  Social distancing reinforced today.  Patient Risk: After full review of this patients clinical status, I feel that they are at moderate risk for cardiac decompensation at this time.  Orders/Follow up: Push f/u back with Dr Haroldine Laws to 3-4 months. Will provide Dr Theodosia Blender office number so she can f/u on sleep study/mask replacement.   Today, I have spent 6 minutes with the patient with telehealth technology discussing CHF and sliding scale diuretics.    Signed, Georgiana Shore, NP  05/07/2019 11:15 AM   [facility] [address] [facility] and [place] [address] [phone] (office) [phone] (fax)

## 2019-05-07 ENCOUNTER — Other Ambulatory Visit: Payer: Self-pay | Admitting: Internal Medicine

## 2019-05-07 ENCOUNTER — Encounter (HOSPITAL_COMMUNITY): Payer: Self-pay

## 2019-05-07 ENCOUNTER — Other Ambulatory Visit: Payer: Self-pay

## 2019-05-07 ENCOUNTER — Ambulatory Visit (HOSPITAL_COMMUNITY)
Admission: RE | Admit: 2019-05-07 | Discharge: 2019-05-07 | Disposition: A | Discharge status: Home / Self Care | Payer: BLUE CROSS/BLUE SHIELD | Source: Ambulatory Visit | Attending: Cardiology | Admitting: Cardiology

## 2019-05-07 VITALS — Wt 184.5 lb

## 2019-05-07 DIAGNOSIS — G4730 Sleep apnea, unspecified: Secondary | ICD-10-CM

## 2019-05-07 DIAGNOSIS — I110 Hypertensive heart disease with heart failure: Secondary | ICD-10-CM | POA: Diagnosis not present

## 2019-05-07 DIAGNOSIS — D688 Other specified coagulation defects: Secondary | ICD-10-CM | POA: Diagnosis not present

## 2019-05-07 DIAGNOSIS — Z471 Aftercare following joint replacement surgery: Secondary | ICD-10-CM | POA: Diagnosis not present

## 2019-05-07 DIAGNOSIS — D649 Anemia, unspecified: Secondary | ICD-10-CM | POA: Diagnosis not present

## 2019-05-07 DIAGNOSIS — I5022 Chronic systolic (congestive) heart failure: Secondary | ICD-10-CM

## 2019-05-07 DIAGNOSIS — J45909 Unspecified asthma, uncomplicated: Secondary | ICD-10-CM | POA: Diagnosis not present

## 2019-05-07 DIAGNOSIS — E119 Type 2 diabetes mellitus without complications: Secondary | ICD-10-CM | POA: Diagnosis not present

## 2019-05-07 DIAGNOSIS — M329 Systemic lupus erythematosus, unspecified: Secondary | ICD-10-CM | POA: Diagnosis not present

## 2019-05-07 DIAGNOSIS — Z7982 Long term (current) use of aspirin: Secondary | ICD-10-CM | POA: Diagnosis not present

## 2019-05-07 DIAGNOSIS — G8929 Other chronic pain: Secondary | ICD-10-CM | POA: Diagnosis not present

## 2019-05-07 DIAGNOSIS — I2510 Atherosclerotic heart disease of native coronary artery without angina pectoris: Secondary | ICD-10-CM

## 2019-05-07 DIAGNOSIS — M069 Rheumatoid arthritis, unspecified: Secondary | ICD-10-CM | POA: Diagnosis not present

## 2019-05-07 DIAGNOSIS — I429 Cardiomyopathy, unspecified: Secondary | ICD-10-CM | POA: Diagnosis not present

## 2019-05-07 DIAGNOSIS — G4733 Obstructive sleep apnea (adult) (pediatric): Secondary | ICD-10-CM | POA: Diagnosis not present

## 2019-05-07 DIAGNOSIS — Z95810 Presence of automatic (implantable) cardiac defibrillator: Secondary | ICD-10-CM

## 2019-05-07 DIAGNOSIS — M351 Other overlap syndromes: Secondary | ICD-10-CM | POA: Diagnosis not present

## 2019-05-07 DIAGNOSIS — M797 Fibromyalgia: Secondary | ICD-10-CM | POA: Diagnosis not present

## 2019-05-07 DIAGNOSIS — G43909 Migraine, unspecified, not intractable, without status migrainosus: Secondary | ICD-10-CM | POA: Diagnosis not present

## 2019-05-07 NOTE — Progress Notes (Signed)
Attempted to call patient, no answer. Unable to leave message due to VM being full. AVS sent via mychart.

## 2019-05-07 NOTE — Patient Instructions (Addendum)
Your physician recommends that you schedule a follow-up appointment in: 3 months with Dr. Haroldine Laws August 04, 2019 at [place]   Dr Landis Gandy office number is [phone]

## 2019-05-07 NOTE — Addendum Note (Signed)
Encounter addended by: Valeda Malm, RN on: 05/07/2019 11:57 AM  Actions taken: Follow-up modified, Clinical Note Signed

## 2019-05-08 ENCOUNTER — Other Ambulatory Visit: Payer: Self-pay | Admitting: Nurse Practitioner

## 2019-05-11 ENCOUNTER — Encounter (HOSPITAL_COMMUNITY): Payer: Self-pay

## 2019-05-12 ENCOUNTER — Other Ambulatory Visit: Payer: Self-pay | Admitting: Nurse Practitioner

## 2019-05-12 DIAGNOSIS — M351 Other overlap syndromes: Secondary | ICD-10-CM | POA: Diagnosis not present

## 2019-05-12 DIAGNOSIS — I110 Hypertensive heart disease with heart failure: Secondary | ICD-10-CM | POA: Diagnosis not present

## 2019-05-12 DIAGNOSIS — Z7982 Long term (current) use of aspirin: Secondary | ICD-10-CM | POA: Diagnosis not present

## 2019-05-12 DIAGNOSIS — M069 Rheumatoid arthritis, unspecified: Secondary | ICD-10-CM | POA: Diagnosis not present

## 2019-05-12 DIAGNOSIS — I5022 Chronic systolic (congestive) heart failure: Secondary | ICD-10-CM | POA: Diagnosis not present

## 2019-05-12 DIAGNOSIS — G8929 Other chronic pain: Secondary | ICD-10-CM | POA: Diagnosis not present

## 2019-05-12 DIAGNOSIS — I429 Cardiomyopathy, unspecified: Secondary | ICD-10-CM | POA: Diagnosis not present

## 2019-05-12 DIAGNOSIS — M329 Systemic lupus erythematosus, unspecified: Secondary | ICD-10-CM | POA: Diagnosis not present

## 2019-05-12 DIAGNOSIS — J45909 Unspecified asthma, uncomplicated: Secondary | ICD-10-CM | POA: Diagnosis not present

## 2019-05-12 DIAGNOSIS — E119 Type 2 diabetes mellitus without complications: Secondary | ICD-10-CM | POA: Diagnosis not present

## 2019-05-12 DIAGNOSIS — Z471 Aftercare following joint replacement surgery: Secondary | ICD-10-CM | POA: Diagnosis not present

## 2019-05-12 DIAGNOSIS — D688 Other specified coagulation defects: Secondary | ICD-10-CM | POA: Diagnosis not present

## 2019-05-12 DIAGNOSIS — G43909 Migraine, unspecified, not intractable, without status migrainosus: Secondary | ICD-10-CM | POA: Diagnosis not present

## 2019-05-12 DIAGNOSIS — M797 Fibromyalgia: Secondary | ICD-10-CM | POA: Diagnosis not present

## 2019-05-12 DIAGNOSIS — G4733 Obstructive sleep apnea (adult) (pediatric): Secondary | ICD-10-CM | POA: Diagnosis not present

## 2019-05-12 DIAGNOSIS — D649 Anemia, unspecified: Secondary | ICD-10-CM | POA: Diagnosis not present

## 2019-05-13 DIAGNOSIS — G8929 Other chronic pain: Secondary | ICD-10-CM

## 2019-05-13 DIAGNOSIS — G4733 Obstructive sleep apnea (adult) (pediatric): Secondary | ICD-10-CM

## 2019-05-13 DIAGNOSIS — D688 Other specified coagulation defects: Secondary | ICD-10-CM

## 2019-05-13 DIAGNOSIS — J45909 Unspecified asthma, uncomplicated: Secondary | ICD-10-CM

## 2019-05-13 DIAGNOSIS — M351 Other overlap syndromes: Secondary | ICD-10-CM

## 2019-05-13 DIAGNOSIS — G43909 Migraine, unspecified, not intractable, without status migrainosus: Secondary | ICD-10-CM

## 2019-05-13 DIAGNOSIS — M069 Rheumatoid arthritis, unspecified: Secondary | ICD-10-CM | POA: Diagnosis not present

## 2019-05-13 DIAGNOSIS — Z9181 History of falling: Secondary | ICD-10-CM

## 2019-05-13 DIAGNOSIS — M797 Fibromyalgia: Secondary | ICD-10-CM

## 2019-05-13 DIAGNOSIS — Z96651 Presence of right artificial knee joint: Secondary | ICD-10-CM

## 2019-05-13 DIAGNOSIS — Z90710 Acquired absence of both cervix and uterus: Secondary | ICD-10-CM

## 2019-05-13 DIAGNOSIS — Z7952 Long term (current) use of systemic steroids: Secondary | ICD-10-CM

## 2019-05-13 DIAGNOSIS — E119 Type 2 diabetes mellitus without complications: Secondary | ICD-10-CM

## 2019-05-13 DIAGNOSIS — M329 Systemic lupus erythematosus, unspecified: Secondary | ICD-10-CM | POA: Diagnosis not present

## 2019-05-13 DIAGNOSIS — Z8673 Personal history of transient ischemic attack (TIA), and cerebral infarction without residual deficits: Secondary | ICD-10-CM

## 2019-05-13 DIAGNOSIS — I428 Other cardiomyopathies: Secondary | ICD-10-CM

## 2019-05-13 DIAGNOSIS — D649 Anemia, unspecified: Secondary | ICD-10-CM

## 2019-05-13 DIAGNOSIS — Z471 Aftercare following joint replacement surgery: Secondary | ICD-10-CM | POA: Diagnosis not present

## 2019-05-13 DIAGNOSIS — Z7982 Long term (current) use of aspirin: Secondary | ICD-10-CM

## 2019-05-13 DIAGNOSIS — I110 Hypertensive heart disease with heart failure: Secondary | ICD-10-CM | POA: Diagnosis not present

## 2019-05-13 DIAGNOSIS — I5022 Chronic systolic (congestive) heart failure: Secondary | ICD-10-CM

## 2019-05-13 DIAGNOSIS — Z7984 Long term (current) use of oral hypoglycemic drugs: Secondary | ICD-10-CM

## 2019-05-24 ENCOUNTER — Ambulatory Visit (INDEPENDENT_AMBULATORY_CARE_PROVIDER_SITE_OTHER): Payer: BLUE CROSS/BLUE SHIELD

## 2019-05-24 DIAGNOSIS — I5022 Chronic systolic (congestive) heart failure: Secondary | ICD-10-CM

## 2019-05-24 DIAGNOSIS — Z95810 Presence of automatic (implantable) cardiac defibrillator: Secondary | ICD-10-CM

## 2019-05-24 NOTE — Progress Notes (Signed)
EPIC Encounter for ICM Monitoring  Patient Name: Christine Mcdowell is a 48 y.o. female Date: 05/24/2019 Primary Care Physican: [name], MD Primary Cardiologist:Bensimhon Electrophysiologist:Taylor Lastweight198lbs  Attempted call to patient and unable to reach. Left message to return call. Transmission reviewed.  HeartLogic Heart Failure Index is0suggesting normal. Thoracic impedance showed decrease starting 05/12/2019.  Prescribed:Torsemide20 mg take1tablet (20 mg total)daily and may increase to 60 mg daily for excess fluid.Metolazone 2.5 mg takeone tablet as needed for weight gain of 5 lbs within 3 days.   Labs: 02/16/2019 Creatinine 0.98, BUN 10, Potassium 3.6, Sodium 142, GFR >60 10/17/2018 Creatinine 1.10, BUN 15, Potassium 4.0, Sodium 140, GFR 58->60 08/18/2018 Creatinine 1.65, BUN 19, Potassium 3.1, Sodium 142, GFR 36-41 05/20/2018 Creatinine 1.03, BUN 14, Potassium 3.9, Sodium 140, GFR >60  Recommendations:None, unable to reach.  Follow-up plan: ICM clinic phone appointment on7/05/2019.  Copy of ICM check sent to Dr. Lovena Le.  Trend             [name], RN 05/24/2019 1:06 PM

## 2019-05-25 ENCOUNTER — Other Ambulatory Visit: Payer: Self-pay | Admitting: Nurse Practitioner

## 2019-05-25 DIAGNOSIS — D649 Anemia, unspecified: Secondary | ICD-10-CM | POA: Diagnosis not present

## 2019-05-25 DIAGNOSIS — M329 Systemic lupus erythematosus, unspecified: Secondary | ICD-10-CM | POA: Diagnosis not present

## 2019-05-25 DIAGNOSIS — M351 Other overlap syndromes: Secondary | ICD-10-CM | POA: Diagnosis not present

## 2019-05-25 DIAGNOSIS — Z96651 Presence of right artificial knee joint: Secondary | ICD-10-CM | POA: Diagnosis not present

## 2019-05-25 DIAGNOSIS — E119 Type 2 diabetes mellitus without complications: Secondary | ICD-10-CM | POA: Diagnosis not present

## 2019-05-25 DIAGNOSIS — D688 Other specified coagulation defects: Secondary | ICD-10-CM | POA: Diagnosis not present

## 2019-05-25 DIAGNOSIS — G4733 Obstructive sleep apnea (adult) (pediatric): Secondary | ICD-10-CM | POA: Diagnosis not present

## 2019-05-25 DIAGNOSIS — I5022 Chronic systolic (congestive) heart failure: Secondary | ICD-10-CM | POA: Diagnosis not present

## 2019-05-25 DIAGNOSIS — Z471 Aftercare following joint replacement surgery: Secondary | ICD-10-CM | POA: Diagnosis not present

## 2019-05-25 DIAGNOSIS — G8929 Other chronic pain: Secondary | ICD-10-CM | POA: Diagnosis not present

## 2019-05-25 DIAGNOSIS — M797 Fibromyalgia: Secondary | ICD-10-CM | POA: Diagnosis not present

## 2019-05-25 DIAGNOSIS — G43909 Migraine, unspecified, not intractable, without status migrainosus: Secondary | ICD-10-CM | POA: Diagnosis not present

## 2019-05-25 DIAGNOSIS — I110 Hypertensive heart disease with heart failure: Secondary | ICD-10-CM | POA: Diagnosis not present

## 2019-05-25 DIAGNOSIS — J45909 Unspecified asthma, uncomplicated: Secondary | ICD-10-CM | POA: Diagnosis not present

## 2019-05-25 DIAGNOSIS — I428 Other cardiomyopathies: Secondary | ICD-10-CM | POA: Diagnosis not present

## 2019-05-25 DIAGNOSIS — M069 Rheumatoid arthritis, unspecified: Secondary | ICD-10-CM | POA: Diagnosis not present

## 2019-05-26 ENCOUNTER — Telehealth: Payer: Self-pay

## 2019-05-26 ENCOUNTER — Other Ambulatory Visit (HOSPITAL_COMMUNITY): Payer: Self-pay

## 2019-05-26 MED ORDER — TORSEMIDE 20 MG PO TABS: 20.0000 mg | ORAL_TABLET | ORAL | 2 refills | Status: DC | PRN

## 2019-05-26 NOTE — Telephone Encounter (Signed)
Remote ICM transmission received.  Attempted call to patient regarding ICM remote transmission and no answer, mail box is full  

## 2019-05-28 ENCOUNTER — Encounter: Payer: Self-pay | Admitting: Nurse Practitioner

## 2019-05-31 ENCOUNTER — Telehealth: Payer: Self-pay

## 2019-05-31 NOTE — Telephone Encounter (Signed)
PA FOR DEXCOM SENT TO PLAN

## 2019-05-31 NOTE — Telephone Encounter (Signed)
Call to schedule an office visit abdominal pain.  Thanks

## 2019-06-01 ENCOUNTER — Ambulatory Visit (INDEPENDENT_AMBULATORY_CARE_PROVIDER_SITE_OTHER): Payer: BLUE CROSS/BLUE SHIELD | Admitting: *Deleted

## 2019-06-01 DIAGNOSIS — I420 Dilated cardiomyopathy: Secondary | ICD-10-CM | POA: Diagnosis not present

## 2019-06-01 DIAGNOSIS — I5020 Unspecified systolic (congestive) heart failure: Secondary | ICD-10-CM

## 2019-06-01 LAB — CUP PACEART REMOTE DEVICE CHECK
Battery Remaining Longevity: 180 mo
Battery Remaining Percentage: 100 %
Brady Statistic RV Percent Paced: 0 %
Date Time Interrogation Session: 20200609085100
HighPow Impedance: 73 Ohm
Implantable Lead Implant Date: 20190206
Implantable Lead Location: 753860
Implantable Lead Model: 292
Implantable Lead Serial Number: 438194
Implantable Pulse Generator Implant Date: 20190206
Lead Channel Impedance Value: 626 Ohm
Lead Channel Setting Pacing Amplitude: 2.5 V
Lead Channel Setting Pacing Pulse Width: 0.4 ms
Lead Channel Setting Sensing Sensitivity: 0.5 mV
Pulse Gen Serial Number: 243572

## 2019-06-02 ENCOUNTER — Telehealth: Payer: Self-pay | Admitting: *Deleted

## 2019-06-02 ENCOUNTER — Telehealth: Payer: Self-pay

## 2019-06-02 ENCOUNTER — Ambulatory Visit (INDEPENDENT_AMBULATORY_CARE_PROVIDER_SITE_OTHER): Payer: BC Managed Care – PPO | Admitting: Nurse Practitioner

## 2019-06-02 ENCOUNTER — Other Ambulatory Visit: Payer: Self-pay

## 2019-06-02 ENCOUNTER — Encounter: Payer: Self-pay | Admitting: Nurse Practitioner

## 2019-06-02 DIAGNOSIS — R0602 Shortness of breath: Secondary | ICD-10-CM | POA: Diagnosis not present

## 2019-06-02 DIAGNOSIS — I110 Hypertensive heart disease with heart failure: Secondary | ICD-10-CM | POA: Diagnosis not present

## 2019-06-02 DIAGNOSIS — Z20828 Contact with and (suspected) exposure to other viral communicable diseases: Secondary | ICD-10-CM | POA: Diagnosis not present

## 2019-06-02 DIAGNOSIS — M069 Rheumatoid arthritis, unspecified: Secondary | ICD-10-CM | POA: Diagnosis not present

## 2019-06-02 DIAGNOSIS — Z20822 Contact with and (suspected) exposure to covid-19: Secondary | ICD-10-CM

## 2019-06-02 DIAGNOSIS — E119 Type 2 diabetes mellitus without complications: Secondary | ICD-10-CM | POA: Diagnosis not present

## 2019-06-02 DIAGNOSIS — M797 Fibromyalgia: Secondary | ICD-10-CM | POA: Diagnosis not present

## 2019-06-02 DIAGNOSIS — G43909 Migraine, unspecified, not intractable, without status migrainosus: Secondary | ICD-10-CM | POA: Diagnosis not present

## 2019-06-02 DIAGNOSIS — Z471 Aftercare following joint replacement surgery: Secondary | ICD-10-CM | POA: Diagnosis not present

## 2019-06-02 DIAGNOSIS — G8929 Other chronic pain: Secondary | ICD-10-CM | POA: Diagnosis not present

## 2019-06-02 DIAGNOSIS — D649 Anemia, unspecified: Secondary | ICD-10-CM | POA: Diagnosis not present

## 2019-06-02 DIAGNOSIS — J45909 Unspecified asthma, uncomplicated: Secondary | ICD-10-CM | POA: Diagnosis not present

## 2019-06-02 DIAGNOSIS — M329 Systemic lupus erythematosus, unspecified: Secondary | ICD-10-CM | POA: Diagnosis not present

## 2019-06-02 DIAGNOSIS — G4733 Obstructive sleep apnea (adult) (pediatric): Secondary | ICD-10-CM | POA: Diagnosis not present

## 2019-06-02 DIAGNOSIS — Z96651 Presence of right artificial knee joint: Secondary | ICD-10-CM | POA: Diagnosis not present

## 2019-06-02 DIAGNOSIS — M351 Other overlap syndromes: Secondary | ICD-10-CM | POA: Diagnosis not present

## 2019-06-02 DIAGNOSIS — I428 Other cardiomyopathies: Secondary | ICD-10-CM | POA: Diagnosis not present

## 2019-06-02 DIAGNOSIS — I5022 Chronic systolic (congestive) heart failure: Secondary | ICD-10-CM | POA: Diagnosis not present

## 2019-06-02 DIAGNOSIS — D688 Other specified coagulation defects: Secondary | ICD-10-CM | POA: Diagnosis not present

## 2019-06-02 NOTE — Patient Instructions (Signed)
Person Under Monitoring Name: Christine Mcdowell  Location: [place] [address]   Infection Prevention Recommendations for Individuals Confirmed to have, or Being Evaluated for, 2019 Novel Coronavirus (COVID-19) Infection Who Receive Care at Home  Individuals who are confirmed to have, or are being evaluated for, COVID-19 should follow the prevention steps below until a healthcare provider or local or state health department says they can return to normal activities.  Stay home except to get medical care You should restrict activities outside your home, except for getting medical care. Do not go to work, school, or public areas, and do not use public transportation or taxis.  Call ahead before visiting your doctor Before your medical appointment, call the healthcare provider and tell them that you have, or are being evaluated for, COVID-19 infection. This will help the healthcare provider's office take steps to keep other people from getting infected. Ask your healthcare provider to call the local or state health department.  Monitor your symptoms Seek prompt medical attention if your illness is worsening (e.g., difficulty breathing). Before going to your medical appointment, call the healthcare provider and tell them that you have, or are being evaluated for, COVID-19 infection. Ask your healthcare provider to call the local or state health department.  Wear a facemask You should wear a facemask that covers your nose and mouth when you are in the same room with other people and when you visit a healthcare provider. People who live with or visit you should also wear a facemask while they are in the same room with you.  Separate yourself from other people in your home As much as possible, you should stay in a different room from other people in your home. Also, you should use a separate bathroom, if available.  Avoid sharing household items You should not  share dishes, drinking glasses, cups, eating utensils, towels, bedding, or other items with other people in your home. After using these items, you should wash them thoroughly with soap and water.  Cover your coughs and sneezes Cover your mouth and nose with a tissue when you cough or sneeze, or you can cough or sneeze into your sleeve. Throw used tissues in a lined trash can, and immediately wash your hands with soap and water for at least 20 seconds or use an alcohol-based hand rub.  Wash your [facility] your hands often and thoroughly with soap and water for at least 20 seconds. You can use an alcohol-based hand sanitizer if soap and water are not available and if your hands are not visibly dirty. Avoid touching your eyes, nose, and mouth with unwashed hands.   Prevention Steps for Caregivers and Household Members of Individuals Confirmed to have, or Being Evaluated for, COVID-19 Infection Being Cared for in the Home  If you live with, or provide care at home for, a person confirmed to have, or being evaluated for, COVID-19 infection please follow these guidelines to prevent infection:  Follow healthcare provider's instructions Make sure that you understand and can help the patient follow any healthcare provider instructions for all care.  Provide for the patient's basic needs You should help the patient with basic needs in the home and provide support for getting groceries, prescriptions, and other personal needs.  Monitor the patient's symptoms If they are getting sicker, call his or her medical provider and tell them that the patient has, or is being evaluated for, COVID-19 infection. This will help the healthcare provider's office take  steps to keep other people from getting infected. Ask the healthcare provider to call the local or state health department.  Limit the number of people who have contact with the patient  If possible, have only one caregiver for the patient.   Other household members should stay in another home or place of residence. If this is not possible, they should stay  in another room, or be separated from the patient as much as possible. Use a separate bathroom, if available.  Restrict visitors who do not have an essential need to be in the home.  Keep older adults, very young children, and other sick people away from the patient Keep older adults, very young children, and those who have compromised immune systems or chronic health conditions away from the patient. This includes people with chronic heart, lung, or kidney conditions, diabetes, and cancer.  Ensure good ventilation Make sure that shared spaces in the home have good air flow, such as from an air conditioner or an opened window, weather permitting.  Wash your hands often  Wash your hands often and thoroughly with soap and water for at least 20 seconds. You can use an alcohol based hand sanitizer if soap and water are not available and if your hands are not visibly dirty.  Avoid touching your eyes, nose, and mouth with unwashed hands.  Use disposable paper towels to dry your hands. If not available, use dedicated cloth towels and replace them when they become wet.  Wear a facemask and gloves  Wear a disposable facemask at all times in the room and gloves when you touch or have contact with the patient's blood, body fluids, and/or secretions or excretions, such as sweat, saliva, sputum, nasal mucus, vomit, urine, or feces.  Ensure the mask fits over your nose and mouth tightly, and do not touch it during use.  Throw out disposable facemasks and gloves after using them. Do not reuse.  Wash your hands immediately after removing your facemask and gloves.  If your personal clothing becomes contaminated, carefully remove clothing and launder. Wash your hands after handling contaminated clothing.  Place all used disposable facemasks, gloves, and other waste in a lined container  before disposing them with other household waste.  Remove gloves and wash your hands immediately after handling these items.  Do not share dishes, glasses, or other household items with the patient  Avoid sharing household items. You should not share dishes, drinking glasses, cups, eating utensils, towels, bedding, or other items with a patient who is confirmed to have, or being evaluated for, COVID-19 infection.  After the person uses these items, you should wash them thoroughly with soap and water.  Wash laundry thoroughly  Immediately remove and wash clothes or bedding that have blood, body fluids, and/or secretions or excretions, such as sweat, saliva, sputum, nasal mucus, vomit, urine, or feces, on them.  Wear gloves when handling laundry from the patient.  Read and follow directions on labels of laundry or clothing items and detergent. In general, wash and dry with the warmest temperatures recommended on the label.  Clean all areas the individual has used often  Clean all touchable surfaces, such as counters, tabletops, doorknobs, bathroom fixtures, toilets, phones, keyboards, tablets, and bedside tables, every day. Also, clean any surfaces that may have blood, body fluids, and/or secretions or excretions on them.  Wear gloves when cleaning surfaces the patient has come in contact with.  Use a diluted bleach solution (e.g., dilute bleach with 1 part bleach  and 10 parts water) or a household disinfectant with a label that says EPA-registered for coronaviruses. To make a bleach solution at home, add 1 tablespoon of bleach to 1 quart (4 cups) of water. For a larger supply, add  cup of bleach to 1 gallon (16 cups) of water.  Read labels of cleaning products and follow recommendations provided on product labels. Labels contain instructions for safe and effective use of the cleaning product including precautions you should take when applying the product, such as wearing gloves or eye  protection and making sure you have good ventilation during use of the product.  Remove gloves and wash hands immediately after cleaning.  Monitor yourself for signs and symptoms of illness Caregivers and household members are considered close contacts, should monitor their health, and will be asked to limit movement outside of the home to the extent possible. Follow the monitoring steps for close contacts listed on the symptom monitoring form.   ? If you have additional questions, contact your local health department or call the epidemiologist on call at [phone] (available 24/7). ? This guidance is subject to change. For the most up-to-date guidance from [facility], please refer to their website: YouBlogs.pl

## 2019-06-02 NOTE — Progress Notes (Signed)
Virtual Visit via Video    This visit type was conducted due to national recommendations for restrictions regarding the COVID-19 Pandemic (e.g. social distancing) in an effort to limit this patient's exposure and mitigate transmission in our community.  Patients identity confirmed using two different identifiers.  This format is felt to be most appropriate for this patient at this time.  All issues noted in this document were discussed and addressed.  No physical exam was performed (except for noted visual exam findings with Video Visits).    Date:  06/02/2019   ID:  Christine Mcdowell, DOB 29-Nov-1971, MRN [id number]  Patient Location:  Home - spoke with Christine Mcdowell  Provider location:   Office    Chief Complaint:  Exposure to possible COVID19  History of Present Illness:    Christine Mcdowell is a 48 y.o. female who presents via video conferencing for a telehealth visit today.    The patient does not have symptoms concerning for COVID-19 infection (fever, chills, cough, or new shortness of breath).   She was exposed to her hairdresser who had been exposed to a person who is positive.   Shortness of Breath  This is a recurrent (has been an issue for few weeks, being treated by her Cardiologist) problem. The problem occurs intermittently. Pertinent negatives include no chest pain or fever. She has tried ipratropium inhalers for the symptoms. The treatment provided moderate relief. Her past medical history is significant for allergies and pneumonia.     Past Medical History:  Diagnosis Date  . AICD (automatic cardioverter/defibrillator) present 01/28/2018  . Anemia   . Anginal pain ([place])   . Asthma   . Cervical cancer ([place])   . CHF (congestive heart failure) ([place])   . 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 Discoid lupus   . Fibromyalgia   . History of blood transfusion "several"   "related to anemia; had some w/hysterectomy also"  . Hx  of cardiovascular stress test    ETT-Myoview (9/15):  No ischemia, EF 52%; NORMAL  . Hx of echocardiogram    Echo (9/15):  EF 50-55%, ant HK, Gr 1 DD, mild MR, mild LAE, no effusion  . Hypertension   . Iron deficiency anemia    h/o iron transfusions  . Lupus (systemic lupus erythematosus) ([place])   . Migraine    "a few/year" (07/03/2016)  . Pneumonia 12/2015  . RA (rheumatoid arthritis) ([place])    "all over" (07/03/2016)  . Sickle cell trait ([place])   . Stroke ([employer]) 2014 X 1; 2015 X 2; 2016 X 1;    "right side of face more relaxed than the other; rare speech hesitation" (07/03/2016)  . Vaginal Pap smear, abnormal    ASCUS; HPV   Past Surgical History:  Procedure Laterality Date  . ABDOMINAL HYSTERECTOMY  2009  . ABDOMINAL WOUND DEHISCENCE  2009  . CARDIAC CATHETERIZATION N/A 10/11/2016   Procedure: Right/Left Heart Cath and Coronary Angiography;  Surgeon: Jolaine Artist, MD;  Location: [facility];  Service: Cardiovascular;  Laterality: N/A;  . DILATION AND CURETTAGE OF UTERUS  1991  . HEMATOMA EVACUATION  2009   abdomen  . ICD IMPLANT  01/28/2018  . ICD IMPLANT N/A 01/28/2018   Procedure: ICD IMPLANT;  Surgeon: Evans Lance, MD;  Location: [facility];  Service: Cardiovascular;  Laterality: N/A;  . INCISE AND DRAIN ABCESS  2009 X 2   "abdomen after hysterectomy"  . KNEE ARTHROSCOPY  Right 1997  . KNEE SURGERY Right   . TUBAL LIGATION  1996     No outpatient medications have been marked as taking for the 06/02/19 encounter (Office Visit) with [name], [place].     Allergies:   Hydromorphone; Iodinated diagnostic agents; Other; Erythromycin; Latex; Tape; and Mircette [desogestrel-ethinyl estradiol]   Social History   Tobacco Use  . Smoking status: Never Smoker  . Smokeless tobacco: Never Used  Substance Use Topics  . Alcohol use: No  . Drug use: No     Family Hx: The patient's family history includes Allergies in her father and mother; Arthritis in her  mother; Asthma in her maternal grandmother; Cancer in her paternal grandfather; Cushing syndrome in her father; Dementia in her paternal grandmother; Depression in her father; Diabetes in her maternal grandmother; Drug abuse in her mother; Heart attack in her father and maternal grandfather; Heart murmur in her mother; Hypertension in her maternal grandmother.  ROS:   Please see the history of present illness.    Review of Systems  Constitutional: Negative for fever.  Respiratory: Positive for shortness of breath. Negative for cough.   Cardiovascular: Negative for chest pain.       Being treated for fluid in her lungs by the cardiologist  Neurological: Negative for dizziness and tingling.    All other systems reviewed and are negative.   Labs/Other Tests and Data Reviewed:    Recent Labs: 10/17/2018: B Natriuretic Peptide 72.3 02/16/2019: Hemoglobin 10.4; Platelets 234 03/29/2019: ALT 11; BUN 14; Creatinine, Ser 1.15; Potassium 5.2; Sodium 137; TSH 0.750   Recent Lipid Panel Lab Results  Component Value Date/Time   CHOL 216 (H) 03/29/2019 03:46 PM   TRIG 252 (H) 03/29/2019 03:46 PM   HDL 40 03/29/2019 03:46 PM   CHOLHDL 5.4 (H) 03/29/2019 03:46 PM   LDLCALC 126 (H) 03/29/2019 03:46 PM    Wt Readings from Last 3 Encounters:  05/07/19 184 lb 8 oz (83.7 kg)  03/29/19 186 lb (84.4 kg)  02/16/19 191 lb 6 oz (86.8 kg)     Exam:    Vital Signs:  There were no vitals taken for this visit.    Physical Exam  Constitutional: She is oriented to person, place, and time. No distress.  Neurological: She is alert and oriented to person, place, and time.  Psychiatric: Mood, memory, affect and judgment normal.    ASSESSMENT & PLAN:    1. Close Exposure to Covid-19 Virus  No current symptoms  She has an appt with [facility] on 06/03/2019 at 945am  I have advised her to  Patient evaluated via virtual visit, she has an appt with [facility] on 06/03/2019 at 945am provided home with instructions for  home care and Quarantine. Instructed to seek further care if symptoms worsen.   2. Shortness of breath  Intermittent likely not related to COVID-19, reports had some fluid in her lungs by her Cardiologist and she has a history of allergies   COVID-19 Education: The signs and symptoms of COVID-19 were discussed with the patient and how to seek care for testing (follow up with PCP or arrange E-visit).  The importance of social distancing was discussed today.  Patient Risk:   After full review of this patients clinical status, I feel that they are at least moderate risk at this time.  Time:   Today, I have spent 9 minutes/ seconds with the patient with telehealth technology discussing above diagnoses.     Medication Adjustments/Labs and Tests Ordered:  Current medicines are reviewed at length with the patient today.  Concerns regarding medicines are outlined above.   Tests Ordered: No orders of the defined types were placed in this encounter.   Medication Changes: No orders of the defined types were placed in this encounter.   Disposition:  Follow up prn  Signed, [name], FNP

## 2019-06-02 NOTE — Telephone Encounter (Signed)
Verbal consent given for virtual visit  

## 2019-06-02 NOTE — Telephone Encounter (Signed)
Pt scheduled for covid testing at [facility] site for tomorrow 06/03/2019. Requested by Durenda Guthrie, NP at [facility].  Pt with possible exposure  Pts CB # [place] ph # [phone] Fax [phone]  Testing process reviewed; pt verbalizes understanding.

## 2019-06-03 ENCOUNTER — Other Ambulatory Visit: Payer: Self-pay

## 2019-06-03 ENCOUNTER — Ambulatory Visit: Payer: BLUE CROSS/BLUE SHIELD | Admitting: Nurse Practitioner

## 2019-06-03 DIAGNOSIS — Z20822 Contact with and (suspected) exposure to covid-19: Secondary | ICD-10-CM

## 2019-06-07 ENCOUNTER — Other Ambulatory Visit: Payer: Self-pay | Admitting: Nurse Practitioner

## 2019-06-07 ENCOUNTER — Other Ambulatory Visit: Payer: Self-pay | Admitting: Internal Medicine

## 2019-06-07 ENCOUNTER — Other Ambulatory Visit (HOSPITAL_COMMUNITY): Payer: Self-pay

## 2019-06-07 MED ORDER — CARVEDILOL 25 MG PO TABS: ORAL_TABLET | ORAL | 0 refills | Status: DC

## 2019-06-08 DIAGNOSIS — Z794 Long term (current) use of insulin: Secondary | ICD-10-CM | POA: Diagnosis not present

## 2019-06-08 DIAGNOSIS — E109 Type 1 diabetes mellitus without complications: Secondary | ICD-10-CM | POA: Diagnosis not present

## 2019-06-08 LAB — NOVEL CORONAVIRUS, NAA: SARS-CoV-2, NAA: NOT DETECTED

## 2019-06-10 ENCOUNTER — Encounter: Payer: Self-pay | Admitting: Nurse Practitioner

## 2019-06-10 NOTE — Progress Notes (Signed)
Remote ICD transmission.   

## 2019-06-11 DIAGNOSIS — E109 Type 1 diabetes mellitus without complications: Secondary | ICD-10-CM | POA: Diagnosis not present

## 2019-06-11 DIAGNOSIS — Z794 Long term (current) use of insulin: Secondary | ICD-10-CM | POA: Diagnosis not present

## 2019-06-14 DIAGNOSIS — E109 Type 1 diabetes mellitus without complications: Secondary | ICD-10-CM | POA: Diagnosis not present

## 2019-06-14 DIAGNOSIS — Z794 Long term (current) use of insulin: Secondary | ICD-10-CM | POA: Diagnosis not present

## 2019-06-17 ENCOUNTER — Encounter (HOSPITAL_COMMUNITY): Payer: Self-pay | Admitting: Internal Medicine

## 2019-06-18 ENCOUNTER — Other Ambulatory Visit (HOSPITAL_COMMUNITY)
Admission: RE | Admit: 2019-06-18 | Discharge: 2019-06-18 | Disposition: A | Discharge status: Home / Self Care | Payer: BC Managed Care – PPO | Source: Ambulatory Visit | Attending: Nurse Practitioner | Admitting: Nurse Practitioner

## 2019-06-18 ENCOUNTER — Other Ambulatory Visit (HOSPITAL_COMMUNITY)
Admission: RE | Admit: 2019-06-18 | Discharge: 2019-06-18 | Disposition: A | Discharge status: Home / Self Care | Payer: BC Managed Care – PPO | Source: Ambulatory Visit | Attending: Internal Medicine | Admitting: Internal Medicine

## 2019-06-18 DIAGNOSIS — I10 Essential (primary) hypertension: Secondary | ICD-10-CM | POA: Insufficient documentation

## 2019-06-18 DIAGNOSIS — I110 Hypertensive heart disease with heart failure: Secondary | ICD-10-CM | POA: Diagnosis not present

## 2019-06-18 DIAGNOSIS — J45909 Unspecified asthma, uncomplicated: Secondary | ICD-10-CM | POA: Diagnosis not present

## 2019-06-18 DIAGNOSIS — Z96651 Presence of right artificial knee joint: Secondary | ICD-10-CM | POA: Diagnosis not present

## 2019-06-18 DIAGNOSIS — I5022 Chronic systolic (congestive) heart failure: Secondary | ICD-10-CM | POA: Diagnosis not present

## 2019-06-18 DIAGNOSIS — M329 Systemic lupus erythematosus, unspecified: Secondary | ICD-10-CM | POA: Diagnosis not present

## 2019-06-18 DIAGNOSIS — Z471 Aftercare following joint replacement surgery: Secondary | ICD-10-CM | POA: Diagnosis not present

## 2019-06-18 DIAGNOSIS — I428 Other cardiomyopathies: Secondary | ICD-10-CM | POA: Diagnosis not present

## 2019-06-18 DIAGNOSIS — D649 Anemia, unspecified: Secondary | ICD-10-CM | POA: Diagnosis not present

## 2019-06-18 DIAGNOSIS — E118 Type 2 diabetes mellitus with unspecified complications: Secondary | ICD-10-CM | POA: Insufficient documentation

## 2019-06-18 DIAGNOSIS — D688 Other specified coagulation defects: Secondary | ICD-10-CM | POA: Diagnosis not present

## 2019-06-18 DIAGNOSIS — M069 Rheumatoid arthritis, unspecified: Secondary | ICD-10-CM | POA: Diagnosis not present

## 2019-06-18 DIAGNOSIS — G43909 Migraine, unspecified, not intractable, without status migrainosus: Secondary | ICD-10-CM | POA: Diagnosis not present

## 2019-06-18 DIAGNOSIS — G8929 Other chronic pain: Secondary | ICD-10-CM | POA: Diagnosis not present

## 2019-06-18 DIAGNOSIS — M351 Other overlap syndromes: Secondary | ICD-10-CM | POA: Diagnosis not present

## 2019-06-18 DIAGNOSIS — E119 Type 2 diabetes mellitus without complications: Secondary | ICD-10-CM | POA: Diagnosis not present

## 2019-06-18 DIAGNOSIS — G4733 Obstructive sleep apnea (adult) (pediatric): Secondary | ICD-10-CM | POA: Diagnosis not present

## 2019-06-18 DIAGNOSIS — M797 Fibromyalgia: Secondary | ICD-10-CM | POA: Diagnosis not present

## 2019-06-18 LAB — IRON AND TIBC
Iron: 37 ug/dL (ref 28–170)
Saturation Ratios: 10 % — ABNORMAL LOW (ref 10.4–31.8)
TIBC: 379 ug/dL (ref 250–450)
UIBC: 342 ug/dL

## 2019-06-18 LAB — HEPATIC FUNCTION PANEL
ALT: 12 U/L (ref 0–44)
AST: 14 U/L — ABNORMAL LOW (ref 15–41)
Albumin: 4 g/dL (ref 3.5–5.0)
Alkaline Phosphatase: 56 U/L (ref 38–126)
Bilirubin, Direct: 0.1 mg/dL (ref 0.0–0.2)
Indirect Bilirubin: 0.4 mg/dL (ref 0.3–0.9)
Total Bilirubin: 0.5 mg/dL (ref 0.3–1.2)
Total Protein: 6.5 g/dL (ref 6.5–8.1)

## 2019-06-18 LAB — CBC
HCT: 34.4 % — ABNORMAL LOW (ref 36.0–46.0)
Hemoglobin: 10.5 g/dL — ABNORMAL LOW (ref 12.0–15.0)
MCH: 26.4 pg (ref 26.0–34.0)
MCHC: 30.5 g/dL (ref 30.0–36.0)
MCV: 86.6 fL (ref 80.0–100.0)
Platelets: 248 10*3/uL (ref 150–400)
RBC: 3.97 MIL/uL (ref 3.87–5.11)
RDW: 16 % — ABNORMAL HIGH (ref 11.5–15.5)
WBC: 6.4 10*3/uL (ref 4.0–10.5)
nRBC: 0 % (ref 0.0–0.2)

## 2019-06-18 LAB — CREATININE, SERUM
Creatinine, Ser: 0.83 mg/dL (ref 0.44–1.00)
GFR calc Af Amer: >60 mL/min (ref >60)
GFR calc non Af Amer: >60 mL/min (ref >60)

## 2019-06-18 LAB — BASIC METABOLIC PANEL
Anion gap: 10 (ref 5–15)
BUN: 9 mg/dL (ref 6–20)
CO2: 28 mmol/L (ref 22–32)
Calcium: 9.2 mg/dL (ref 8.9–10.3)
Chloride: 105 mmol/L (ref 98–111)
Creatinine, Ser: 0.85 mg/dL (ref 0.44–1.00)
GFR calc Af Amer: >60 mL/min (ref >60)
GFR calc non Af Amer: >60 mL/min (ref >60)
Glucose, Bld: 97 mg/dL (ref 70–99)
Potassium: 3.4 mmol/L — ABNORMAL LOW (ref 3.5–5.1)
Sodium: 143 mmol/L (ref 135–145)

## 2019-06-18 LAB — FERRITIN: Ferritin: 17 ng/mL (ref 11–307)

## 2019-06-19 LAB — HEMOGLOBIN A1C
Hgb A1c MFr Bld: 6.4 % — ABNORMAL HIGH (ref 4.8–5.6)
Mean Plasma Glucose: 137 mg/dL

## 2019-06-23 ENCOUNTER — Other Ambulatory Visit: Payer: Self-pay

## 2019-06-23 ENCOUNTER — Ambulatory Visit (INDEPENDENT_AMBULATORY_CARE_PROVIDER_SITE_OTHER): Payer: Medicare Other | Admitting: Nurse Practitioner

## 2019-06-23 ENCOUNTER — Encounter: Payer: Self-pay | Admitting: Nurse Practitioner

## 2019-06-23 DIAGNOSIS — J3089 Other allergic rhinitis: Secondary | ICD-10-CM

## 2019-06-23 DIAGNOSIS — M545 Low back pain, unspecified: Secondary | ICD-10-CM

## 2019-06-23 DIAGNOSIS — F321 Major depressive disorder, single episode, moderate: Secondary | ICD-10-CM | POA: Diagnosis not present

## 2019-06-23 MED ORDER — TRAMADOL HCL 50 MG PO TABS: 50.0000 mg | ORAL_TABLET | Freq: Four times a day (QID) | ORAL | 0 refills | Status: DC | PRN

## 2019-06-23 MED ORDER — IPRATROPIUM BROMIDE 0.03 % NA SOLN: 2.0000 | Freq: Three times a day (TID) | NASAL | 2 refills | Status: DC | PRN

## 2019-06-23 NOTE — Progress Notes (Signed)
Virtual Visit via Video   This visit type was conducted due to national recommendations for restrictions regarding the COVID-19 Pandemic (e.g. social distancing) in an effort to limit this patient's exposure and mitigate transmission in our community.  Due to her co-morbid illnesses, this patient is at least at moderate risk for complications without adequate follow up.  This format is felt to be most appropriate for this patient at this time.  All issues noted in this document were discussed and addressed.  A limited physical exam was performed with this format.    This visit type was conducted due to national recommendations for restrictions regarding the COVID-19 Pandemic (e.g. social distancing) in an effort to limit this patient's exposure and mitigate transmission in our community.  Patients identity confirmed using two different identifiers.  This format is felt to be most appropriate for this patient at this time.  All issues noted in this document were discussed and addressed.  No physical exam was performed (except for noted visual exam findings with Video Visits).    Date:  07/05/2019   ID:  Christine Mcdowell, DOB 06-Jan-1971, MRN [id number]  Patient Location:  Outside - spoke with Christine Mcdowell  Provider location:   Office    Chief Complaint:  Back pain   History of Present Illness:    Christine Mcdowell is a 48 y.o. female who presents via video conferencing for a telehealth visit today.    The patient does not have symptoms concerning for COVID-19 infection (fever, chills, cough, or new shortness of breath).   She is off prednisone, she is checking her blood sugar and is normal.   Diabetes Pertinent negatives for hypoglycemia include no headaches. Associated symptoms include polydipsia. Pertinent negatives for diabetes include no fatigue, no polyphagia and no polyuria. There are no hypoglycemic complications. Pertinent negatives for hypoglycemia complications include  no required assistance. She participates in exercise intermittently. An ACE inhibitor/angiotensin II receptor blocker is being taken. She does not see a podiatrist.Eye exam is current.  Back Pain This is a recurrent problem. The current episode started 1 to 4 weeks ago. The problem occurs constantly. The problem has been gradually worsening since onset. The quality of the pain is described as aching. The pain does not radiate. The pain is worse during the night. The symptoms are aggravated by lying down. Pertinent negatives include no abdominal pain or headaches. Risk factors include lack of exercise and sedentary lifestyle. Treatments tried: stretching.     Past Medical History:  Diagnosis Date  . AICD (automatic cardioverter/defibrillator) present 01/28/2018  . Anemia   . Anginal pain ([place])   . Asthma   . Cervical cancer ([place])   . CHF (congestive heart failure) ([place])   . 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 Discoid lupus   . Fibromyalgia   . History of blood transfusion "several"   "related to anemia; had some w/hysterectomy also"  . Hx of cardiovascular stress test    ETT-Myoview (9/15):  No ischemia, EF 52%; NORMAL  . Hx of echocardiogram    Echo (9/15):  EF 50-55%, ant HK, Gr 1 DD, mild MR, mild LAE, no effusion  . Hypertension   . Iron deficiency anemia    h/o iron transfusions  . Lupus (systemic lupus erythematosus) ([place])   . Migraine    "a few/year" (07/03/2016)  . Pneumonia 12/2015  . RA (rheumatoid arthritis) ([place])    "all over" (07/03/2016)  . Sickle cell trait ([place])   .  Stroke ([place]) 2014 X 1; 2015 X 2; 2016 X 1;    "right side of face more relaxed than the other; rare speech hesitation" (07/03/2016)  . Vaginal Pap smear, abnormal    ASCUS; HPV   Past Surgical History:  Procedure Laterality Date  . ABDOMINAL HYSTERECTOMY  2009  . ABDOMINAL WOUND DEHISCENCE  2009  . CARDIAC CATHETERIZATION N/A 10/11/2016   Procedure: Right/Left Heart Cath  and Coronary Angiography;  Surgeon: Jolaine Artist, MD;  Location: [facility];  Service: Cardiovascular;  Laterality: N/A;  . DILATION AND CURETTAGE OF UTERUS  1991  . HEMATOMA EVACUATION  2009   abdomen  . ICD IMPLANT  01/28/2018  . ICD IMPLANT N/A 01/28/2018   Procedure: ICD IMPLANT;  Surgeon: Evans Lance, MD;  Location: [facility];  Service: Cardiovascular;  Laterality: N/A;  . INCISE AND DRAIN ABCESS  2009 X 2   "abdomen after hysterectomy"  . KNEE ARTHROSCOPY Right 1997  . KNEE SURGERY Right   . TUBAL LIGATION  1996     No outpatient medications have been marked as taking for the 06/23/19 encounter (Office Visit) with [name], [place].     Allergies:   Hydromorphone, Iodinated diagnostic agents, Other, Erythromycin, Latex, Tape, and Mircette [desogestrel-ethinyl estradiol]   Social History   Tobacco Use  . Smoking status: Never Smoker  . Smokeless tobacco: Never Used  Substance Use Topics  . Alcohol use: No  . Drug use: No     Family Hx: The patient's family history includes Allergies in her father and mother; Arthritis in her mother; Asthma in her maternal grandmother; Cancer in her paternal grandfather; Cushing syndrome in her father; Dementia in her paternal grandmother; Depression in her father; Diabetes in her maternal grandmother; Drug abuse in her mother; Heart attack in her father and maternal grandfather; Heart murmur in her mother; Hypertension in her maternal grandmother.  ROS:   Please see the history of present illness.    Review of Systems  Constitutional: Negative for fatigue.  Gastrointestinal: Negative for abdominal pain.  Musculoskeletal: Positive for back pain.  Neurological: Negative for headaches.  Endo/Heme/Allergies: Positive for polydipsia. Negative for polyphagia.    All other systems reviewed and are negative.   Labs/Other Tests and Data Reviewed:    Recent Labs: 10/17/2018: B Natriuretic Peptide 72.3 03/29/2019: TSH  0.750 06/18/2019: ALT 12; BUN 9; Creatinine, Ser 0.83; Creatinine, Ser 0.85; Hemoglobin 10.5; Platelets 248; Potassium 3.4; Sodium 143   Recent Lipid Panel Lab Results  Component Value Date/Time   CHOL 216 (H) 03/29/2019 03:46 PM   TRIG 252 (H) 03/29/2019 03:46 PM   HDL 40 03/29/2019 03:46 PM   CHOLHDL 5.4 (H) 03/29/2019 03:46 PM   LDLCALC 126 (H) 03/29/2019 03:46 PM    Wt Readings from Last 3 Encounters:  05/07/19 184 lb 8 oz (83.7 kg)  03/29/19 186 lb (84.4 kg)  02/16/19 191 lb 6 oz (86.8 kg)     Exam:    Vital Signs:  There were no vitals taken for this visit.    Physical Exam  Constitutional: She is oriented to person, place, and time.  Neurological: She is alert and oriented to person, place, and time.  Psychiatric: Mood, memory, affect and judgment normal.    ASSESSMENT & PLAN:    1. Allergic rhinitis due to other allergic trigger, unspecified seasonality  Take as directed. - ipratropium (ATROVENT) 0.03 % nasal spray; Place 2 sprays into the nose 3 (three) times daily  as needed for rhinitis.  Dispense: 30 mL; Refill: 2  2. MDD (major depressive disorder), single episode, moderate (HCC)  Controlled, not currently feeling depressed  3. Acute bilateral low back pain without sciatica  Will check lumbar spine xray  Pending results will refer for PT  Encouraged to stretch regularly - DG Lumbar Spine Complete; Future - traMADol (ULTRAM) 50 MG tablet; Take 1 tablet (50 mg total) by mouth every 6 (six) hours as needed.  Dispense: 20 tablet; Refill: 0 - POCT Urinalysis Dipstick (09628)  COVID-19 Education: The signs and symptoms of COVID-19 were discussed with the patient and how to seek care for testing (follow up with PCP or arrange E-visit).  The importance of social distancing was discussed today.  Patient Risk:   After full review of this patients clinical status, I feel that they are at least moderate risk at this time.  Time:   Today, I have spent 12  minutes/ seconds with the patient with telehealth technology discussing above diagnoses.     Medication Adjustments/Labs and Tests Ordered: Current medicines are reviewed at length with the patient today.  Concerns regarding medicines are outlined above.   Tests Ordered: Orders Placed This Encounter  Procedures  . DG Lumbar Spine Complete  . POCT Urinalysis Dipstick (81002)    Medication Changes: Meds ordered this encounter  Medications  . ipratropium (ATROVENT) 0.03 % nasal spray    Sig: Place 2 sprays into the nose 3 (three) times daily as needed for rhinitis.    Dispense:  30 mL    Refill:  2  . traMADol (ULTRAM) 50 MG tablet    Sig: Take 1 tablet (50 mg total) by mouth every 6 (six) hours as needed.    Dispense:  20 tablet    Refill:  0    Disposition:  Follow up prn  Signed, [name], FNP

## 2019-06-24 ENCOUNTER — Other Ambulatory Visit: Payer: Self-pay

## 2019-06-24 ENCOUNTER — Ambulatory Visit
Admission: RE | Admit: 2019-06-24 | Discharge: 2019-06-24 | Disposition: A | Discharge status: Home / Self Care | Payer: BC Managed Care – PPO | Source: Ambulatory Visit | Attending: Nurse Practitioner | Admitting: Nurse Practitioner

## 2019-06-24 ENCOUNTER — Other Ambulatory Visit: Payer: Medicare Other

## 2019-06-24 DIAGNOSIS — M545 Low back pain, unspecified: Secondary | ICD-10-CM

## 2019-06-24 DIAGNOSIS — S3992XA Unspecified injury of lower back, initial encounter: Secondary | ICD-10-CM | POA: Diagnosis not present

## 2019-06-24 LAB — POCT URINALYSIS DIPSTICK
Bilirubin, UA: NEGATIVE
Blood, UA: NEGATIVE
Glucose, UA: NEGATIVE
Ketones, UA: NEGATIVE
Leukocytes, UA: NEGATIVE
Nitrite, UA: NEGATIVE
Protein, UA: NEGATIVE
Spec Grav, UA: 1.015 (ref 1.010–1.025)
Urobilinogen, UA: 0.2 E.U./dL
pH, UA: 5.5 (ref 5.0–8.0)

## 2019-06-25 ENCOUNTER — Other Ambulatory Visit: Payer: Self-pay | Admitting: Nurse Practitioner

## 2019-06-25 ENCOUNTER — Other Ambulatory Visit (HOSPITAL_COMMUNITY): Payer: Self-pay | Admitting: Internal Medicine

## 2019-06-25 ENCOUNTER — Encounter: Payer: Self-pay | Admitting: Nurse Practitioner

## 2019-06-28 ENCOUNTER — Other Ambulatory Visit: Payer: Self-pay | Admitting: Nurse Practitioner

## 2019-06-28 ENCOUNTER — Ambulatory Visit (INDEPENDENT_AMBULATORY_CARE_PROVIDER_SITE_OTHER): Payer: Medicare Other

## 2019-06-28 DIAGNOSIS — Z95810 Presence of automatic (implantable) cardiac defibrillator: Secondary | ICD-10-CM

## 2019-06-28 DIAGNOSIS — I5022 Chronic systolic (congestive) heart failure: Secondary | ICD-10-CM

## 2019-06-28 DIAGNOSIS — M545 Low back pain, unspecified: Secondary | ICD-10-CM

## 2019-06-29 ENCOUNTER — Other Ambulatory Visit: Payer: Self-pay | Admitting: Nurse Practitioner

## 2019-06-29 ENCOUNTER — Telehealth: Payer: Self-pay

## 2019-06-29 NOTE — Telephone Encounter (Signed)
Remote ICM transmission received.  Attempted call to patient regarding ICM remote transmission and left message, per DPR, to return call.    

## 2019-06-29 NOTE — Progress Notes (Signed)
EPIC Encounter for ICM Monitoring  Patient Name: Christine Mcdowell is a 48 y.o. female Date: 06/29/2019 Primary Care Physican: [name], MD Primary Cardiologist:Bensimhon Electrophysiologist:Taylor 05/07/2019 Weight:184lbs  Attempted call to patient and unable to reach. Left message to return call. Transmission reviewed.   06/27/2019 [employer] Heart Failure Index is 1 which is within normal threshold range.  Prescribed: Torsemide20 mg take1tablet (20 mg total)daily and may increase to 60 mg daily for excess fluid.Metolazone 2.5 mg takeone tablet as needed for weight gain of 5 lbs within 3 days.   Labs: 02/16/2019 Creatinine 0.98, BUN 10, Potassium 3.6, Sodium 142, GFR >60 10/17/2018 Creatinine 1.10, BUN 15, Potassium 4.0, Sodium 140, GFR 58->60 08/18/2018 Creatinine 1.65, BUN 19, Potassium 3.1, Sodium 142, GFR 36-41 05/20/2018 Creatinine 1.03, BUN 14, Potassium 3.9, Sodium 140, GFR >60  Recommendations:None, unable to reach.  Follow-up plan: ICM clinic phone appointment on8/09/2019.  Copy of ICM check sent to Dr. Lovena Le.     [name], RN 06/29/2019 11:20 AM

## 2019-06-30 ENCOUNTER — Other Ambulatory Visit: Payer: Self-pay | Admitting: Internal Medicine

## 2019-06-30 MED ORDER — PROAIR HFA 108 (90 BASE) MCG/ACT IN AERS: INHALATION_SPRAY | RESPIRATORY_TRACT | 0 refills | Status: DC

## 2019-07-05 ENCOUNTER — Encounter: Payer: Self-pay | Admitting: Physical Therapy

## 2019-07-05 ENCOUNTER — Ambulatory Visit: Payer: Medicare Other | Attending: Nurse Practitioner | Admitting: Physical Therapy

## 2019-07-05 ENCOUNTER — Other Ambulatory Visit: Payer: Self-pay

## 2019-07-05 DIAGNOSIS — M545 Low back pain, unspecified: Secondary | ICD-10-CM

## 2019-07-05 NOTE — Therapy (Signed)
[facility] [place], [place], [address] Phone: [phone]   Fax:  [phone]  Physical Therapy Evaluation  Patient Details  Name: Christine Mcdowell MRN: [id number] Date of Birth: 30-Dec-1970 Referring Provider (PT): [name].   Encounter Date: 07/05/2019  PT End of Session - 07/05/19 1328    Visit Number  1    Number of Visits  12    Date for PT Re-Evaluation  10/03/19    PT Start Time  0104    PT Stop Time  0139    PT Time Calculation (min)  35 min    Activity Tolerance  Patient tolerated treatment well    Behavior During Therapy  [facility] for tasks assessed/performed       Past Medical History:  Diagnosis Date  . AICD (automatic cardioverter/defibrillator) present 01/28/2018  . Anemia   . Anginal pain ([place])   . Asthma   . Cervical cancer ([place])   . CHF (congestive heart failure) ([place])   . 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 Discoid lupus   . Fibromyalgia   . History of blood transfusion "several"   "related to anemia; had some w/hysterectomy also"  . Hx of cardiovascular stress test    ETT-Myoview (9/15):  No ischemia, EF 52%; NORMAL  . Hx of echocardiogram    Echo (9/15):  EF 50-55%, ant HK, Gr 1 DD, mild MR, mild LAE, no effusion  . Hypertension   . Iron deficiency anemia    h/o iron transfusions  . Lupus (systemic lupus erythematosus) ([place])   . Migraine    "a few/year" (07/03/2016)  . Pneumonia 12/2015  . RA (rheumatoid arthritis) ([place])    "all over" (07/03/2016)  . Sickle cell trait ([place])   . Stroke ([place]) 2014 X 1; 2015 X 2; 2016 X 1;    "right side of face more relaxed than the other; rare speech hesitation" (07/03/2016)  . Vaginal Pap smear, abnormal    ASCUS; HPV    Past Surgical History:  Procedure Laterality Date  . ABDOMINAL HYSTERECTOMY  2009  . ABDOMINAL WOUND DEHISCENCE  2009  . CARDIAC CATHETERIZATION N/A 10/11/2016   Procedure: Right/Left Heart Cath and  Coronary Angiography;  Surgeon: Jolaine Artist, MD;  Location: [place] [facility];  Service: Cardiovascular;  Laterality: N/A;  . DILATION AND CURETTAGE OF UTERUS  1991  . HEMATOMA EVACUATION  2009   abdomen  . ICD IMPLANT  01/28/2018  . ICD IMPLANT N/A 01/28/2018   Procedure: ICD IMPLANT;  Surgeon: Evans Lance, MD;  Location: [place] [facility];  Service: Cardiovascular;  Laterality: N/A;  . INCISE AND DRAIN ABCESS  2009 X 2   "abdomen after hysterectomy"  . KNEE ARTHROSCOPY Right 1997  . KNEE SURGERY Right   . TUBAL LIGATION  1996    There were no vitals filed for this visit.   Subjective Assessment - 07/05/19 1331    Subjective  COVID-19 screen performed prior to patient entering clinic.  The patient reports the onset of low back pain about 2 months ago.  She had a total knee replacement in march and feels it is related to this.  Her pain-level is an 8/10 with increased pain with transitory movements (ie:  sit to stand).  She has found nothing really decreases her pain.    Pertinent History  HTN, Asthma, CHF. fibromyalgia, Lupus, right TKA (4 knee surgeries total), Allergy to paper tape, pacemaker/Defib.  Diagnostic tests  X-ray.    Patient Stated Goals  Get out of pain.    Currently in Pain?  Yes    Pain Score  8     Pain Location  Back    Pain Orientation  Right;Left    Pain Descriptors / Indicators  Aching    Pain Type  Acute pain    Pain Onset  More than a month ago    Pain Frequency  Constant    Aggravating Factors   See above.    Pain Relieving Factors  See above.         [facility] PT Assessment - 07/05/19 0001      Assessment   Medical Diagnosis  Acute bilateral low back pain without sciatica.    Referring Provider (PT)  [name].    Onset Date/Surgical Date  --   ~2 months.     Precautions   Precautions  --   Pacemaker/Defib.  Tape allergy.     Restrictions   Weight Bearing Restrictions  No      Balance Screen   Has the patient fallen in the past  6 months  Yes    How many times?  --   1.   Has the patient had a decrease in activity level because of a fear of falling?   Yes      [place] residence      Prior Function   Level of Independence  Independent      Posture/Postural Control   Posture/Postural Control  No significant limitations      Deep Tendon Reflexes   DTR Assessment Site  Achilles    Achilles DTR  --   Diminished bilateral Achilles reflexes.     ROM / Strength   AROM / PROM / Strength  AROM;Strength      AROM   Overall AROM Comments  Normal active lumbar flexion and extension.      Strength   Overall Strength Comments  Normal LE strength.      Palpation   Palpation comment  Tender to palpation over bilateral SIJ's and both QL's.      Special Tests   Other special tests  (-) SLR testing, (=) leg lengths; (+) FABER testing.      Ambulation/Gait   Gait Comments  Patient walks with some antalgia.                Objective measurements completed on examination: See above findings.                   PT Long Term Goals - 07/05/19 1402      PT LONG TERM GOAL #1   Title  Independent with a HEP.    Time  6    Period  Weeks    Status  New      PT LONG TERM GOAL #2   Title  Sit 30 minutes with pain not > 2-3/10.    Time  6    Period  Weeks    Status  New      PT LONG TERM GOAL #3   Title  Stand 20 minutes with pain not > 2-3/10.    Time  6    Period  Weeks    Status  New      PT LONG TERM GOAL #4   Title  Transition from sit to stand with pain not > 3/10.  Time  6    Period  Weeks    Status  New      PT LONG TERM GOAL #5   Title  Perform ADL's with pain not > 2-3/10.    Time  6    Period  Weeks    Status  New             Plan - 07/05/19 1339    Clinical Impression Statement  The patient presents to OPPT with c/o bilateral low back pain that began about 2 months ago.  She is tender to palption over both SIJ's and  QL's.    Comorbidities  HTN, Asthma, CHF. fibromyalgia, Lupus, right TKA (4 knee surgeries total), Allergy to paper tape, pacemaker/Defib.    Stability/Clinical Decision Making  Stable/Uncomplicated    Clinical Decision Making  Low    Rehab Potential  Excellent    PT Frequency  2x / week    PT Duration  6 weeks    PT Treatment/Interventions  ADLs/Self Care Home Management;Moist Heat;Therapeutic exercise;Therapeutic activities;Patient/family education;Manual techniques;Passive range of motion    PT Next Visit Plan  STW/M to bilateral SIJ's and QL's, HMP, Core exercises.    Consulted and Agree with Plan of Care  Patient       Patient will benefit from skilled therapeutic intervention in order to improve the following deficits and impairments:  Abnormal gait, Pain, Decreased activity tolerance  Visit Diagnosis: 1. Acute bilateral low back pain without sciatica        Problem List Patient Active Problem List   Diagnosis Date Noted  . MDD (major depressive disorder), single episode, moderate ([place]) 06/23/2019  . Chronic systolic (congestive) heart failure ([place]) 01/28/2018  . Upper airway cough syndrome 01/19/2018  . Pulmonary infiltrates on CXR 01/19/2018  . Migraines 10/08/2017  . Sleep apnea with use of continuous positive airway pressure (CPAP) 09/24/2017  . Congestive heart failure (CHF) ([place]) 10/09/2016  . Respiratory distress 10/09/2016  . Rheumatoid arthritis ([place]) 10/09/2016  . Asthma 10/09/2016  . Chronic pain 10/09/2016  . Heart failure ([place]) 10/09/2016  . Congestive heart failure ([place])   . Chest pain 07/03/2016  . Lupus (systemic lupus erythematosus) ([place])   . Diabetes mellitus with complication ([place])   . Anxiety state   . GERD (gastroesophageal reflux disease) 12/28/2015  . Essential hypertension 12/26/2015  . Type 2 diabetes mellitus ([place]) 12/26/2015  . Chronic systolic heart failure ([place]) 08/18/2014  . Cardiomyopathy, dilated ([place]) 01/27/2014  . Hypokalemia  01/27/2014  . Shortness of breath 01/26/2014  . SLE (systemic lupus erythematosus) ([place]) 01/26/2014    APPLEGATE, [place] MPT 07/05/2019, 2:07 PM  [facility] [place], [place], [address] Phone: [phone]   Fax:  [phone]  Name: Christine Mcdowell MRN: [id number] Date of Birth: June 11, 1971

## 2019-07-07 ENCOUNTER — Encounter: Payer: Self-pay | Admitting: Physical Therapy

## 2019-07-07 ENCOUNTER — Ambulatory Visit: Payer: Medicare Other | Admitting: Physical Therapy

## 2019-07-07 ENCOUNTER — Other Ambulatory Visit: Payer: Self-pay

## 2019-07-07 DIAGNOSIS — M545 Low back pain, unspecified: Secondary | ICD-10-CM

## 2019-07-07 NOTE — Therapy (Signed)
[facility] [place], [place], [address] Phone: [phone]   Fax:  [phone]  Physical Therapy Treatment  Patient Details  Name: Christine Mcdowell MRN: [id number] Date of Birth: [birth year] Referring Provider (PT): [name].   Encounter Date: 07/07/2019  PT End of Session - 07/07/19 1435    Visit Number  2    Number of Visits  12    Date for PT Re-Evaluation  10/03/19    PT Start Time  0802    PT Stop Time  1529    PT Time Calculation (min)  52 min    Activity Tolerance  Patient tolerated treatment well    Behavior During Therapy  [facility] for tasks assessed/performed       Past Medical History:  Diagnosis Date  . AICD (automatic cardioverter/defibrillator) present 01/28/2018  . Anemia   . Anginal pain ([place])   . Asthma   . Cervical cancer ([place])   . CHF (congestive heart failure) ([place])   . Diabetes mellitus without complication ([place])    steroid induced  . Discoid lupus   . Fibromyalgia   . History of blood transfusion "several"   "related to anemia; had some w/hysterectomy also"  . Hx of cardiovascular stress test    ETT-Myoview (9/15):  No ischemia, EF 52%; NORMAL  . Hx of echocardiogram    Echo (9/15):  EF 50-55%, ant HK, Gr 1 DD, mild MR, mild LAE, no effusion  . Hypertension   . Iron deficiency anemia    h/o iron transfusions  . Lupus (systemic lupus erythematosus) ([place])   . Migraine    "a few/year" (07/03/2016)  . Pneumonia 12/2015  . RA (rheumatoid arthritis) ([place])    "all over" (07/03/2016)  . Sickle cell trait ([place])   . Stroke ([place]) 2014 X 1; 2015 X 2; 2016 X 1;    "right side of face more relaxed than the other; rare speech hesitation" (07/03/2016)  . Vaginal Pap smear, abnormal    ASCUS; HPV    Past Surgical History:  Procedure Laterality Date  . ABDOMINAL HYSTERECTOMY  2009  . ABDOMINAL WOUND DEHISCENCE  2009  . CARDIAC CATHETERIZATION N/A 10/11/2016   Procedure: Right/Left Heart Cath and  Coronary Angiography;  Surgeon: Jolaine Artist, MD;  Location: [facility];  Service: Cardiovascular;  Laterality: N/A;  . DILATION AND CURETTAGE OF UTERUS  1991  . HEMATOMA EVACUATION  2009   abdomen  . ICD IMPLANT  01/28/2018  . ICD IMPLANT N/A 01/28/2018   Procedure: ICD IMPLANT;  Surgeon: Evans Lance, MD;  Location: [place] [facility];  Service: Cardiovascular;  Laterality: N/A;  . INCISE AND DRAIN ABCESS  2009 X 2   "abdomen after hysterectomy"  . KNEE ARTHROSCOPY Right 1997  . KNEE SURGERY Right   . TUBAL LIGATION  1996    There were no vitals filed for this visit.  Subjective Assessment - 07/07/19 1433    Subjective  COVID 19 screening performed on patient upon arrival .Reports pain upon waking and tries to walk to ease pain. Upon sitting when muscles relax pain begins again.    Pertinent History  HTN, Asthma, CHF. fibromyalgia, Lupus, right TKA (4 knee surgeries total), Allergy to paper tape, pacemaker/Defib.    Diagnostic tests  X-ray.    Patient Stated Goals  Get out of pain.    Currently in Pain?  Yes    Pain Score  6     Pain Location  Back    Pain Orientation  Right;Left;Lower    Pain Descriptors / Indicators  Aching;Throbbing;Discomfort    Pain Type  Acute pain    Pain Onset  More than a month ago    Pain Frequency  Constant         [facility] PT Assessment - 07/07/19 0001      Assessment   Medical Diagnosis  Acute bilateral low back pain without sciatica.    Referring Provider (PT)  [name].      Restrictions   Weight Bearing Restrictions  No                   [facility] Adult PT Treatment/Exercise - 07/07/19 0001      Modalities   Modalities  Ultrasound;Moist Heat      Moist Heat Therapy   Number Minutes Moist Heat  15 Minutes    Moist Heat Location  Lumbar Spine      Ultrasound   Ultrasound Location  B SI joint    Ultrasound Parameters  1.5 w/cm2, 100%, 1 mhz x10 min    Ultrasound Goals  Pain      Manual Therapy   Manual  Therapy  Soft tissue mobilization    Soft tissue mobilization  STW to B lumbar paraspinals, SI joints to reduce muscle guarding and pain                  PT Long Term Goals - 07/05/19 1402      PT LONG TERM GOAL #1   Title  Independent with a HEP.    Time  6    Period  Weeks    Status  New      PT LONG TERM GOAL #2   Title  Sit 30 minutes with pain not > 2-3/10.    Time  6    Period  Weeks    Status  New      PT LONG TERM GOAL #3   Title  Stand 20 minutes with pain not > 2-3/10.    Time  6    Period  Weeks    Status  New      PT LONG TERM GOAL #4   Title  Transition from sit to stand with pain not > 3/10.    Time  6    Period  Weeks    Status  New      PT LONG TERM GOAL #5   Title  Perform ADL's with pain not > 2-3/10.    Time  6    Period  Weeks    Status  New            Plan - 07/07/19 1542    Clinical Impression Statement  Patient presented in clinic with continued high level pain in low back with transitional movements. Increased muscle tone palpable in B lumbar paraspinals today. No excessive tenderness reported by patient today with manual therapy. Normal [place] and moist heat response noted following end of the modalities.    Personal Factors and Comorbidities  Comorbidity 1;Comorbidity 2;Comorbidity 3+    Comorbidities  HTN, Asthma, CHF. fibromyalgia, Lupus, right TKA (4 knee surgeries total), Allergy to paper tape, pacemaker/Defib.    Stability/Clinical Decision Making  Stable/Uncomplicated    Rehab Potential  Excellent    PT Frequency  2x / week    PT Duration  6 weeks    PT Treatment/Interventions  ADLs/Self Care Home Management;Moist Heat;Therapeutic exercise;Therapeutic activities;Patient/family education;Manual techniques;Passive range  of motion    PT Next Visit Plan  STW/M to bilateral SIJ's and QL's, HMP, Core exercises.    Consulted and Agree with Plan of Care  Patient       Patient will benefit from skilled therapeutic intervention in  order to improve the following deficits and impairments:  Abnormal gait, Pain, Decreased activity tolerance  Visit Diagnosis: 1. Acute bilateral low back pain without sciatica        Problem List Patient Active Problem List   Diagnosis Date Noted  . MDD (major depressive disorder), single episode, moderate ([place]) 06/23/2019  . Chronic systolic (congestive) heart failure ([place]) 01/28/2018  . Upper airway cough syndrome 01/19/2018  . Pulmonary infiltrates on CXR 01/19/2018  . Migraines 10/08/2017  . Sleep apnea with use of continuous positive airway pressure (CPAP) 09/24/2017  . Congestive heart failure (CHF) ([place]) 10/09/2016  . Respiratory distress 10/09/2016  . Rheumatoid arthritis ([place]) 10/09/2016  . Asthma 10/09/2016  . Chronic pain 10/09/2016  . Heart failure ([place]) 10/09/2016  . Congestive heart failure ([place])   . Chest pain 07/03/2016  . Lupus (systemic lupus erythematosus) ([place])   . Diabetes mellitus with complication ([place])   . Anxiety state   . GERD (gastroesophageal reflux disease) 12/28/2015  . Essential hypertension 12/26/2015  . Type 2 diabetes mellitus ([place]) 12/26/2015  . Chronic systolic heart failure ([place]) 08/18/2014  . Cardiomyopathy, dilated ([place]) 01/27/2014  . Hypokalemia 01/27/2014  . Shortness of breath 01/26/2014  . SLE (systemic lupus erythematosus) ([place]) 01/26/2014    Standley Brooking, PTA 07/07/2019, 3:46 PM  [facility] [address] [place], [place], [address] Phone: [phone]   Fax:  [phone]  Name: Christine Mcdowell MRN: [id number] Date of Birth: Jul 09, 1971

## 2019-07-09 ENCOUNTER — Telehealth (HOSPITAL_COMMUNITY): Payer: Self-pay

## 2019-07-09 NOTE — Telephone Encounter (Signed)
Cardiac clearance faxed to [facility] clinic.  Fax confirmation received.

## 2019-07-13 ENCOUNTER — Ambulatory Visit: Payer: Medicare Other | Admitting: Physical Therapy

## 2019-07-14 DIAGNOSIS — Z7952 Long term (current) use of systemic steroids: Secondary | ICD-10-CM | POA: Diagnosis not present

## 2019-07-14 DIAGNOSIS — R600 Localized edema: Secondary | ICD-10-CM | POA: Diagnosis not present

## 2019-07-14 DIAGNOSIS — M329 Systemic lupus erythematosus, unspecified: Secondary | ICD-10-CM | POA: Diagnosis not present

## 2019-07-14 DIAGNOSIS — Z79899 Other long term (current) drug therapy: Secondary | ICD-10-CM | POA: Diagnosis not present

## 2019-07-14 DIAGNOSIS — M064 Inflammatory polyarthropathy: Secondary | ICD-10-CM | POA: Diagnosis not present

## 2019-07-14 DIAGNOSIS — D649 Anemia, unspecified: Secondary | ICD-10-CM | POA: Diagnosis not present

## 2019-07-14 DIAGNOSIS — I7300 Raynaud's syndrome without gangrene: Secondary | ICD-10-CM | POA: Diagnosis not present

## 2019-07-14 DIAGNOSIS — L659 Nonscarring hair loss, unspecified: Secondary | ICD-10-CM | POA: Diagnosis not present

## 2019-07-14 DIAGNOSIS — M0579 Rheumatoid arthritis with rheumatoid factor of multiple sites without organ or systems involvement: Secondary | ICD-10-CM | POA: Diagnosis not present

## 2019-07-15 ENCOUNTER — Ambulatory Visit: Payer: Medicare Other | Admitting: Physical Therapy

## 2019-07-15 ENCOUNTER — Encounter: Payer: Self-pay | Admitting: Physical Therapy

## 2019-07-15 ENCOUNTER — Other Ambulatory Visit: Payer: Self-pay

## 2019-07-15 DIAGNOSIS — M545 Low back pain, unspecified: Secondary | ICD-10-CM

## 2019-07-15 DIAGNOSIS — E109 Type 1 diabetes mellitus without complications: Secondary | ICD-10-CM | POA: Diagnosis not present

## 2019-07-15 DIAGNOSIS — Z794 Long term (current) use of insulin: Secondary | ICD-10-CM | POA: Diagnosis not present

## 2019-07-15 NOTE — Therapy (Signed)
[facility] [place], [place], [address] Phone: [phone]   Fax:  [phone]  Physical Therapy Treatment  Patient Details  Name: Christine Mcdowell MRN: [id number] Date of Birth: [birth year] Referring Provider (PT): [name].   Encounter Date: 07/15/2019  PT End of Session - 07/15/19 1437    Visit Number  3    Number of Visits  12    Date for PT Re-Evaluation  10/03/19    PT Start Time  5573    PT Stop Time  1520    PT Time Calculation (min)  43 min    Activity Tolerance  Patient tolerated treatment well    Behavior During Therapy  [facility] for tasks assessed/performed       Past Medical History:  Diagnosis Date  . AICD (automatic cardioverter/defibrillator) present 01/28/2018  . Anemia   . Anginal pain ([place])   . Asthma   . Cervical cancer ([place])   . CHF (congestive heart failure) ([place])   . Diabetes mellitus without complication ([place])    steroid induced  . Discoid lupus   . Fibromyalgia   . History of blood transfusion "several"   "related to anemia; had some w/hysterectomy also"  . Hx of cardiovascular stress test    ETT-Myoview (9/15):  No ischemia, EF 52%; NORMAL  . Hx of echocardiogram    Echo (9/15):  EF 50-55%, ant HK, Gr 1 DD, mild MR, mild LAE, no effusion  . Hypertension   . Iron deficiency anemia    h/o iron transfusions  . Lupus (systemic lupus erythematosus) ([place])   . Migraine    "a few/year" (07/03/2016)  . Pneumonia 12/2015  . RA (rheumatoid arthritis) ([place])    "all over" (07/03/2016)  . Sickle cell trait ([place])   . Stroke ([place]) 2014 X 1; 2015 X 2; 2016 X 1;    "right side of face more relaxed than the other; rare speech hesitation" (07/03/2016)  . Vaginal Pap smear, abnormal    ASCUS; HPV    Past Surgical History:  Procedure Laterality Date  . ABDOMINAL HYSTERECTOMY  2009  . ABDOMINAL WOUND DEHISCENCE  2009  . CARDIAC CATHETERIZATION N/A 10/11/2016   Procedure: Right/Left Heart Cath and  Coronary Angiography;  Surgeon: Jolaine Artist, MD;  Location: [facility];  Service: Cardiovascular;  Laterality: N/A;  . DILATION AND CURETTAGE OF UTERUS  1991  . HEMATOMA EVACUATION  2009   abdomen  . ICD IMPLANT  01/28/2018  . ICD IMPLANT N/A 01/28/2018   Procedure: ICD IMPLANT;  Surgeon: Evans Lance, MD;  Location: [facility];  Service: Cardiovascular;  Laterality: N/A;  . INCISE AND DRAIN ABCESS  2009 X 2   "abdomen after hysterectomy"  . KNEE ARTHROSCOPY Right 1997  . KNEE SURGERY Right   . TUBAL LIGATION  1996    There were no vitals filed for this visit.  Subjective Assessment - 07/15/19 1435    Subjective  COVID 19 screening performed on patient upon arrival. Patient placed on prednisone again secondary to heart related issues.    Pertinent History  HTN, Asthma, CHF. fibromyalgia, Lupus, right TKA (4 knee surgeries total), Allergy to paper tape, pacemaker/Defib.    Diagnostic tests  X-ray.    Patient Stated Goals  Get out of pain.    Currently in Pain?  Yes    Pain Score  4     Pain Location  Back    Pain Orientation  Lower  Pain Descriptors / Indicators  Discomfort    Pain Type  Acute pain    Pain Onset  More than a month ago    Pain Frequency  Intermittent         [facility] PT Assessment - 07/15/19 0001      Assessment   Medical Diagnosis  Acute bilateral low back pain without sciatica.    Referring Provider (PT)  [name].                   [facility] Adult PT Treatment/Exercise - 07/15/19 0001      Exercises   Exercises  Lumbar      Lumbar Exercises: Stretches   Single Knee to Chest Stretch  Right;3 reps;30 seconds    Figure 4 Stretch  3 reps;30 seconds;Supine;With overpressure      Lumbar Exercises: Supine   Ab Set  15 reps;5 seconds    Glut Set  15 reps;5 seconds    Clam  15 reps;3 seconds    Bent Knee Raise  15 reps;3 seconds      Modalities   Modalities  Moist Heat      Moist Heat Therapy   Number Minutes Moist  Heat  10 Minutes    Moist Heat Location  Lumbar Spine      Manual Therapy   Manual Therapy  Soft tissue mobilization    Soft tissue mobilization  STW to B QL, lumbar paraspinals, SI joints to reduce muscle guarding and pain                  PT Long Term Goals - 07/05/19 1402      PT LONG TERM GOAL #1   Title  Independent with a HEP.    Time  6    Period  Weeks    Status  New      PT LONG TERM GOAL #2   Title  Sit 30 minutes with pain not > 2-3/10.    Time  6    Period  Weeks    Status  New      PT LONG TERM GOAL #3   Title  Stand 20 minutes with pain not > 2-3/10.    Time  6    Period  Weeks    Status  New      PT LONG TERM GOAL #4   Title  Transition from sit to stand with pain not > 3/10.    Time  6    Period  Weeks    Status  New      PT LONG TERM GOAL #5   Title  Perform ADL's with pain not > 2-3/10.    Time  6    Period  Weeks    Status  New            Plan - 07/15/19 1554    Clinical Impression Statement  Patient presented in clinic with lower level discomfort in low back but patient back on prednisone. Patient introduced to low level supine core strengthening with no complaints of discomfort. Patient's greatest discomfort being with prolonged sitting and sitting > standing movement. Increased muscle tone noted in B lumbar paraspinals, QL. Normal moist heat response noted following end of modality session. Patient reported less discomfort following end of treatment.    Personal Factors and Comorbidities  Comorbidity 1;Comorbidity 2;Comorbidity 3+    Comorbidities  HTN, Asthma, CHF. fibromyalgia, Lupus, right TKA (4 knee surgeries total), Allergy to paper tape,  pacemaker/Defib.    Stability/Clinical Decision Making  Stable/Uncomplicated    Rehab Potential  Excellent    PT Frequency  2x / week    PT Duration  6 weeks    PT Treatment/Interventions  ADLs/Self Care Home Management;Moist Heat;Therapeutic exercise;Therapeutic activities;Patient/family  education;Manual techniques;Passive range of motion    PT Next Visit Plan  STW/M to bilateral SIJ's and QL's, HMP, Core exercises.    Consulted and Agree with Plan of Care  Patient       Patient will benefit from skilled therapeutic intervention in order to improve the following deficits and impairments:  Abnormal gait, Pain, Decreased activity tolerance  Visit Diagnosis: 1. Acute bilateral low back pain without sciatica        Problem List Patient Active Problem List   Diagnosis Date Noted  . MDD (major depressive disorder), single episode, moderate ([place]) 06/23/2019  . Chronic systolic (congestive) heart failure ([place]) 01/28/2018  . Upper airway cough syndrome 01/19/2018  . Pulmonary infiltrates on CXR 01/19/2018  . Migraines 10/08/2017  . Sleep apnea with use of continuous positive airway pressure (CPAP) 09/24/2017  . Congestive heart failure (CHF) ([place]) 10/09/2016  . Respiratory distress 10/09/2016  . Rheumatoid arthritis ([place]) 10/09/2016  . Asthma 10/09/2016  . Chronic pain 10/09/2016  . Heart failure ([place]) 10/09/2016  . Congestive heart failure ([place])   . Chest pain 07/03/2016  . Lupus (systemic lupus erythematosus) ([place])   . Diabetes mellitus with complication ([place])   . Anxiety state   . GERD (gastroesophageal reflux disease) 12/28/2015  . Essential hypertension 12/26/2015  . Type 2 diabetes mellitus ([place]) 12/26/2015  . Chronic systolic heart failure ([place]) 08/18/2014  . Cardiomyopathy, dilated ([place]) 01/27/2014  . Hypokalemia 01/27/2014  . Shortness of breath 01/26/2014  . SLE (systemic lupus erythematosus) ([place]) 01/26/2014    Standley Brooking, PTA 07/15/2019, 4:03 PM  [facility] [address] [place], [place], [address] Phone: [phone]   Fax:  [phone]  Name: Renna Kilmer MRN: [id number] Date of Birth: 11-04-1971

## 2019-07-16 ENCOUNTER — Encounter (HOSPITAL_COMMUNITY): Payer: Self-pay

## 2019-07-19 ENCOUNTER — Ambulatory Visit (INDEPENDENT_AMBULATORY_CARE_PROVIDER_SITE_OTHER): Payer: Medicare Other

## 2019-07-19 DIAGNOSIS — Z95810 Presence of automatic (implantable) cardiac defibrillator: Secondary | ICD-10-CM

## 2019-07-19 DIAGNOSIS — I5022 Chronic systolic (congestive) heart failure: Secondary | ICD-10-CM

## 2019-07-19 NOTE — Progress Notes (Signed)
EPIC Encounter for ICM Monitoring  Patient Name: Christine Mcdowell is a 48 y.o. female Date: 07/19/2019 Primary Care Physican: [name], MD Primary Cardiologist:Bensimhon Electrophysiologist:Taylor 05/07/2019 Weight:184lbs  Attempted call to patient and unable to reach. Left message to return call. Transmission reviewed.   Per 7/24 mychart message to HF clinic pt experiencing lower extremity edema, ShOB and hand swelling.   07/18/2019 HeartLogic Heart Failure Index is 16 suggesting possible fluid accumulation and crossed normal threshold since 07/15/2019.    Prescribed: Torsemide20 mg take1tablet (20 mg total)daily and may increase to 60 mg daily for excess fluid.Metolazone 2.5 mg takeone tablet as needed for weight gain of 5 lbs within 3 days.   Labs: 06/18/2019 Creatinine 0.85, BUN 9,   Potassium 3.4, Sodium 143, GFR >60 03/29/2019 Creatinine 1.15, BUN 14, Potassium 5.2, Sodium 137, GFR 56-64 02/16/2019 Creatinine 0.98, BUN 10, Potassium 3.6, Sodium 142, GFR >60 10/17/2018 Creatinine 1.10, BUN 15, Potassium 4.0, Sodium 140, GFR 58->60 08/18/2018 Creatinine 1.65, BUN 19, Potassium 3.1, Sodium 142, GFR 36-41 05/20/2018 Creatinine 1.03, BUN 14, Potassium 3.9, Sodium 140, GFR >60  Recommendations:None, unable to reach.    Follow-up plan: ICM clinic phone appointment on8/03/2019 to recheck fluid levels and will continue to monitor index alerts.She wishes to reschedule June HF clinic appt that she missed.   Copy of ICM check sent to Dr. Lovena Le and Darrick Grinder, NP since Dr Haroldine Laws is out of the office.    [place], RN 07/19/2019 11:19 AM

## 2019-07-20 ENCOUNTER — Ambulatory Visit: Payer: Medicare Other | Admitting: Physical Therapy

## 2019-07-20 NOTE — Progress Notes (Signed)
Attempted call to patient for transmission review.  Left message requesting return call with contact information.

## 2019-07-22 ENCOUNTER — Ambulatory Visit: Payer: Medicare Other | Admitting: Physical Therapy

## 2019-07-26 ENCOUNTER — Telehealth (HOSPITAL_COMMUNITY): Payer: Self-pay | Admitting: *Deleted

## 2019-07-26 ENCOUNTER — Encounter: Payer: Self-pay | Admitting: Nurse Practitioner

## 2019-07-26 ENCOUNTER — Other Ambulatory Visit: Payer: Self-pay

## 2019-07-26 MED ORDER — DEXCOM G6 RECEIVER DEVI: 1.0000 | Freq: Three times a day (TID) | 0 refills | Status: DC

## 2019-07-26 MED ORDER — DEXCOM G6 SENSOR MISC: 1.0000 | Freq: Three times a day (TID) | 1 refills | Status: DC

## 2019-07-26 MED ORDER — DEXCOM G6 TRANSMITTER MISC: 1.0000 | Freq: Three times a day (TID) | 1 refills | Status: DC

## 2019-07-26 NOTE — Telephone Encounter (Signed)
-----   Message from Essie Hart May sent at 07/21/2019  1:35 PM EDT ----- Kermit Balo Afternoon!  The mentioned PT has contacted the office stating that she has left several messages over the course of the past several weeks and would like to speak with a nurse regarding some symptoms that she is having.  Please contact her @ the listed home number.  Thanks so much!  -Barnett Applebaum

## 2019-07-26 NOTE — Telephone Encounter (Signed)
Called patient no answer left VM

## 2019-07-27 ENCOUNTER — Other Ambulatory Visit: Payer: Self-pay

## 2019-07-27 ENCOUNTER — Ambulatory Visit (INDEPENDENT_AMBULATORY_CARE_PROVIDER_SITE_OTHER): Payer: Medicare Other

## 2019-07-27 ENCOUNTER — Ambulatory Visit: Payer: Medicare Other | Attending: Nurse Practitioner | Admitting: Physical Therapy

## 2019-07-27 DIAGNOSIS — I5022 Chronic systolic (congestive) heart failure: Secondary | ICD-10-CM

## 2019-07-27 DIAGNOSIS — M545 Low back pain, unspecified: Secondary | ICD-10-CM

## 2019-07-27 DIAGNOSIS — Z95810 Presence of automatic (implantable) cardiac defibrillator: Secondary | ICD-10-CM

## 2019-07-27 NOTE — Therapy (Signed)
[facility] [place], [place], [address] Phone: [phone]   Fax:  [phone]  Physical Therapy Treatment  Patient Details  Name: Christine Mcdowell MRN: [id number] Date of Birth: [birth year] Referring Provider (PT): [name].   Encounter Date: 07/27/2019  PT End of Session - 07/27/19 1400    Visit Number  4    Number of Visits  12    Date for PT Re-Evaluation  10/03/19    PT Start Time  1346    PT Stop Time  1435    PT Time Calculation (min)  49 min    Activity Tolerance  Patient tolerated treatment well    Behavior During Therapy  WFL for tasks assessed/performed       Past Medical History:  Diagnosis Date  . AICD (automatic cardioverter/defibrillator) present 01/28/2018  . Anemia   . Anginal pain ([place])   . Asthma   . Cervical cancer ([place])   . CHF (congestive heart failure) ([place])   . Diabetes mellitus without complication ([place])    steroid induced  . Discoid lupus   . Fibromyalgia   . History of blood transfusion "several"   "related to anemia; had some w/hysterectomy also"  . Hx of cardiovascular stress test    ETT-Myoview (9/15):  No ischemia, EF 52%; NORMAL  . Hx of echocardiogram    Echo (9/15):  EF 50-55%, ant HK, Gr 1 DD, mild MR, mild LAE, no effusion  . Hypertension   . Iron deficiency anemia    h/o iron transfusions  . Lupus (systemic lupus erythematosus) ([place])   . Migraine    "a few/year" (07/03/2016)  . Pneumonia 12/2015  . RA (rheumatoid arthritis) ([place])    "all over" (07/03/2016)  . Sickle cell trait ([place])   . Stroke ([place]) 2014 X 1; 2015 X 2; 2016 X 1;    "right side of face more relaxed than the other; rare speech hesitation" (07/03/2016)  . Vaginal Pap smear, abnormal    ASCUS; HPV    Past Surgical History:  Procedure Laterality Date  . ABDOMINAL HYSTERECTOMY  2009  . ABDOMINAL WOUND DEHISCENCE  2009  . CARDIAC CATHETERIZATION N/A 10/11/2016   Procedure: Right/Left Heart Cath and  Coronary Angiography;  Surgeon: Jolaine Artist, MD;  Location: [facility];  Service: Cardiovascular;  Laterality: N/A;  . DILATION AND CURETTAGE OF UTERUS  1991  . HEMATOMA EVACUATION  2009   abdomen  . ICD IMPLANT  01/28/2018  . ICD IMPLANT N/A 01/28/2018   Procedure: ICD IMPLANT;  Surgeon: Evans Lance, MD;  Location: [facility];  Service: Cardiovascular;  Laterality: N/A;  . INCISE AND DRAIN ABCESS  2009 X 2   "abdomen after hysterectomy"  . KNEE ARTHROSCOPY Right 1997  . KNEE SURGERY Right   . TUBAL LIGATION  1996    There were no vitals filed for this visit.  Subjective Assessment - 07/27/19 1453    Subjective  COVID 19 screening performed on patient upon arrival. Patient reports being absent from PT due to her mother's fall and having to drive long distances which has hurt her back.    Pertinent History  HTN, Asthma, CHF. fibromyalgia, Lupus, right TKA (4 knee surgeries total), Allergy to paper tape, pacemaker/Defib.    Diagnostic tests  X-ray.    Patient Stated Goals  Get out of pain.    Currently in Pain?  No/denies         [facility] PT Assessment -  07/27/19 0001      Assessment   Medical Diagnosis  Acute bilateral low back pain without sciatica.    Referring Provider (PT)  [name].      Restrictions   Weight Bearing Restrictions  No                   [facility] Adult PT Treatment/Exercise - 07/27/19 0001      Lumbar Exercises: Aerobic   Nustep  L4 x10 min with core activation VCs      Lumbar Exercises: Supine   Ab Set  10 reps;5 seconds    Glut Set  10 reps;5 seconds    Clam  15 reps;2 seconds    Bent Knee Raise  15 reps;3 seconds      Modalities   Modalities  Moist Heat      Moist Heat Therapy   Number Minutes Moist Heat  10 Minutes    Moist Heat Location  Lumbar Spine      Manual Therapy   Manual Therapy  Soft tissue mobilization    Soft tissue mobilization  STW to B QL, lumbar paraspinals, SI joints to reduce muscle  guarding and pain                  PT Long Term Goals - 07/05/19 1402      PT LONG TERM GOAL #1   Title  Independent with a HEP.    Time  6    Period  Weeks    Status  New      PT LONG TERM GOAL #2   Title  Sit 30 minutes with pain not > 2-3/10.    Time  6    Period  Weeks    Status  New      PT LONG TERM GOAL #3   Title  Stand 20 minutes with pain not > 2-3/10.    Time  6    Period  Weeks    Status  New      PT LONG TERM GOAL #4   Title  Transition from sit to stand with pain not > 3/10.    Time  6    Period  Weeks    Status  New      PT LONG TERM GOAL #5   Title  Perform ADL's with pain not > 2-3/10.    Time  6    Period  Weeks    Status  New            Plan - 07/27/19 1443    Clinical Impression Statement  Patient denied any LBP upon arrival. Patient able to tolerate low level lumbar strengthening exercises fairly well although after session of supine core exercises patient reported some LBP. Increased QL tightness palpable in R QL today with no abnormal tightness palpable in L lumbar musclature. Normal moist heat response following end of treatment session.    Personal Factors and Comorbidities  Comorbidity 1;Comorbidity 2;Comorbidity 3+    Comorbidities  HTN, Asthma, CHF. fibromyalgia, Lupus, right TKA (4 knee surgeries total), Allergy to paper tape, pacemaker/Defib.    Stability/Clinical Decision Making  Stable/Uncomplicated    Rehab Potential  Excellent    PT Frequency  2x / week    PT Duration  6 weeks    PT Treatment/Interventions  ADLs/Self Care Home Management;Moist Heat;Therapeutic exercise;Therapeutic activities;Patient/family education;Manual techniques;Passive range of motion    PT Next Visit Plan  STW/M to bilateral SIJ's and QL's, HMP, Core  exercises.    Consulted and Agree with Plan of Care  Patient       Patient will benefit from skilled therapeutic intervention in order to improve the following deficits and impairments:  Abnormal  gait, Pain, Decreased activity tolerance  Visit Diagnosis: 1. Acute bilateral low back pain without sciatica        Problem List Patient Active Problem List   Diagnosis Date Noted  . MDD (major depressive disorder), single episode, moderate ([place]) 06/23/2019  . Chronic systolic (congestive) heart failure ([place]) 01/28/2018  . Upper airway cough syndrome 01/19/2018  . Pulmonary infiltrates on CXR 01/19/2018  . Migraines 10/08/2017  . Sleep apnea with use of continuous positive airway pressure (CPAP) 09/24/2017  . Congestive heart failure (CHF) ([place]) 10/09/2016  . Respiratory distress 10/09/2016  . Rheumatoid arthritis ([place]) 10/09/2016  . Asthma 10/09/2016  . Chronic pain 10/09/2016  . Heart failure ([place]) 10/09/2016  . Congestive heart failure ([place])   . Chest pain 07/03/2016  . Lupus (systemic lupus erythematosus) ([place])   . Diabetes mellitus with complication ([place])   . Anxiety state   . GERD (gastroesophageal reflux disease) 12/28/2015  . Essential hypertension 12/26/2015  . Type 2 diabetes mellitus ([place]) 12/26/2015  . Chronic systolic heart failure ([place]) 08/18/2014  . Cardiomyopathy, dilated ([place]) 01/27/2014  . Hypokalemia 01/27/2014  . Shortness of breath 01/26/2014  . SLE (systemic lupus erythematosus) ([place]) 01/26/2014    Standley Brooking, PTA 07/27/2019, 2:56 PM  [facility] [address] [place], [place], [address] Phone: [phone]   Fax:  [phone]  Name: Aylssa Herrig MRN: [id number] Date of Birth: [birth year]

## 2019-07-28 ENCOUNTER — Other Ambulatory Visit: Payer: Self-pay

## 2019-07-28 MED ORDER — DEXCOM G6 RECEIVER DEVI: 1.0000 | Freq: Three times a day (TID) | 1 refills | Status: DC

## 2019-07-29 ENCOUNTER — Other Ambulatory Visit: Payer: Self-pay

## 2019-07-29 ENCOUNTER — Ambulatory Visit: Payer: Medicare Other | Admitting: Physical Therapy

## 2019-07-29 ENCOUNTER — Encounter: Payer: Self-pay | Admitting: Nurse Practitioner

## 2019-07-29 DIAGNOSIS — Z471 Aftercare following joint replacement surgery: Secondary | ICD-10-CM | POA: Diagnosis not present

## 2019-07-29 DIAGNOSIS — Z96651 Presence of right artificial knee joint: Secondary | ICD-10-CM | POA: Diagnosis not present

## 2019-07-29 MED ORDER — DEXCOM G6 RECEIVER DEVI: 1.0000 | Freq: Three times a day (TID) | 1 refills | Status: DC

## 2019-07-30 ENCOUNTER — Telehealth: Payer: Self-pay

## 2019-07-30 NOTE — Telephone Encounter (Signed)
Remote ICM transmission received.  Attempted call to patient regarding ICM remote transmission and no answer.  

## 2019-07-30 NOTE — Progress Notes (Signed)
EPIC Encounter for ICM Monitoring  Patient Name: Christine Mcdowell is a 48 y.o. female Date: 07/30/2019 Primary Care Physican: [name], MD Primary Cardiologist:Bensimhon Electrophysiologist:Taylor 05/07/2019 Weight:184lbs  Attempted call to patient and unable to reach. Transmission reviewed.     07/26/2019 HeartLogic Heart Failure Index decreased to 12 suggesting improvement in fluid levels.    Prescribed:Torsemide20 mg take1tablet (20 mg total)daily and may increase to 60 mg daily for excess fluid.Metolazone 2.5 mg takeone tablet as needed for weight gain of 5 lbs within 3 days.   Labs: 06/18/2019 Creatinine 0.85, BUN 9,   Potassium 3.4, Sodium 143, GFR >60 03/29/2019 Creatinine 1.15, BUN 14, Potassium 5.2, Sodium 137, GFR 56-64 02/16/2019 Creatinine 0.98, BUN 10, Potassium 3.6, Sodium 142, GFR >60 10/17/2018 Creatinine 1.10, BUN 15, Potassium 4.0, Sodium 140, GFR 58->60 08/18/2018 Creatinine 1.65, BUN 19, Potassium 3.1, Sodium 142, GFR 36-41 05/20/2018 Creatinine 1.03, BUN 14, Potassium 3.9, Sodium 140, GFR >60  Recommendations:None, unable to reach.    Follow-up plan: ICM clinic phone appointment on 08/31/2019.  Office appt with Dr Haroldine Laws 08/02/2019.  Copy of ICM check sent to Dr. Lovena Le.      [place], RN 07/30/2019 1:27 PM

## 2019-08-02 ENCOUNTER — Encounter: Payer: Self-pay | Admitting: Nurse Practitioner

## 2019-08-02 ENCOUNTER — Ambulatory Visit: Payer: Medicare Other | Admitting: *Deleted

## 2019-08-02 ENCOUNTER — Other Ambulatory Visit: Payer: Self-pay

## 2019-08-02 ENCOUNTER — Ambulatory Visit (INDEPENDENT_AMBULATORY_CARE_PROVIDER_SITE_OTHER): Payer: Medicare Other

## 2019-08-02 DIAGNOSIS — Z95810 Presence of automatic (implantable) cardiac defibrillator: Secondary | ICD-10-CM | POA: Diagnosis not present

## 2019-08-02 DIAGNOSIS — M545 Low back pain, unspecified: Secondary | ICD-10-CM

## 2019-08-02 DIAGNOSIS — I5022 Chronic systolic (congestive) heart failure: Secondary | ICD-10-CM

## 2019-08-02 NOTE — Therapy (Signed)
[facility] [place], [place], [address] Phone: [phone]   Fax:  [phone]  Physical Therapy Treatment  Patient Details  Name: Christine Mcdowell MRN: [id number] Date of Birth: 1971-09-06 Referring Provider (PT): [name].   Encounter Date: 08/02/2019  PT End of Session - 08/02/19 1603    Visit Number  5    Number of Visits  12    Date for PT Re-Evaluation  10/03/19    PT Start Time  1430    PT Stop Time  1521    PT Time Calculation (min)  51 min       Past Medical History:  Diagnosis Date  . AICD (automatic cardioverter/defibrillator) present 01/28/2018  . Anemia   . Anginal pain ([place])   . Asthma   . Cervical cancer ([place])   . CHF (congestive heart failure) ([place])   . Diabetes mellitus without complication ([place])    steroid induced  . Discoid lupus   . Fibromyalgia   . History of blood transfusion "several"   "related to anemia; had some w/hysterectomy also"  . Hx of cardiovascular stress test    ETT-Myoview (9/15):  No ischemia, EF 52%; NORMAL  . Hx of echocardiogram    Echo (9/15):  EF 50-55%, ant HK, Gr 1 DD, mild MR, mild LAE, no effusion  . Hypertension   . Iron deficiency anemia    h/o iron transfusions  . Lupus (systemic lupus erythematosus) ([place])   . Migraine    "a few/year" (07/03/2016)  . Pneumonia 12/2015  . RA (rheumatoid arthritis) ([place])    "all over" (07/03/2016)  . Sickle cell trait ([place])   . Stroke ([place]) 2014 X 1; 2015 X 2; 2016 X 1;    "right side of face more relaxed than the other; rare speech hesitation" (07/03/2016)  . Vaginal Pap smear, abnormal    ASCUS; HPV    Past Surgical History:  Procedure Laterality Date  . ABDOMINAL HYSTERECTOMY  2009  . ABDOMINAL WOUND DEHISCENCE  2009  . CARDIAC CATHETERIZATION N/A 10/11/2016   Procedure: Right/Left Heart Cath and Coronary Angiography;  Surgeon: Jolaine Artist, MD;  Location: [facility];  Service: Cardiovascular;   Laterality: N/A;  . DILATION AND CURETTAGE OF UTERUS  1991  . HEMATOMA EVACUATION  2009   abdomen  . ICD IMPLANT  01/28/2018  . ICD IMPLANT N/A 01/28/2018   Procedure: ICD IMPLANT;  Surgeon: Evans Lance, MD;  Location: [facility];  Service: Cardiovascular;  Laterality: N/A;  . INCISE AND DRAIN ABCESS  2009 X 2   "abdomen after hysterectomy"  . KNEE ARTHROSCOPY Right 1997  . KNEE SURGERY Right   . TUBAL LIGATION  1996    There were no vitals filed for this visit.  Subjective Assessment - 08/02/19 1437    Diagnostic tests  X-ray.    Patient Stated Goals  Get out of pain.    Pain Score  3     Pain Location  Back    Pain Orientation  Lower    Pain Descriptors / Indicators  Discomfort    Pain Onset  More than a month ago    Pain Frequency  Intermittent                       [facility] Adult PT Treatment/Exercise - 08/02/19 0001      Exercises   Exercises  Lumbar      Lumbar Exercises: Aerobic  Nustep  L5 x10 min with core activation VCs      Lumbar Exercises: Supine   Ab Set  10 reps;5 seconds    Clam  15 reps;2 seconds    Bent Knee Raise  3 seconds;20 reps    Bridge  20 reps;3 seconds      Modalities   Modalities  Moist Heat      Moist Heat Therapy   Number Minutes Moist Heat  10 Minutes    Moist Heat Location  Lumbar Spine      Manual Therapy   Manual Therapy  Soft tissue mobilization    Soft tissue mobilization  STW to B QL, lumbar paraspinals, SI joints to reduce muscle guarding and pain                  PT Long Term Goals - 07/05/19 1402      PT LONG TERM GOAL #1   Title  Independent with a HEP.    Time  6    Period  Weeks    Status  New      PT LONG TERM GOAL #2   Title  Sit 30 minutes with pain not > 2-3/10.    Time  6    Period  Weeks    Status  New      PT LONG TERM GOAL #3   Title  Stand 20 minutes with pain not > 2-3/10.    Time  6    Period  Weeks    Status  New      PT LONG TERM GOAL #4   Title   Transition from sit to stand with pain not > 3/10.    Time  6    Period  Weeks    Status  New      PT LONG TERM GOAL #5   Title  Perform ADL's with pain not > 2-3/10.    Time  6    Period  Weeks    Status  New            Plan - 08/02/19 1605    Clinical Impression Statement  Pt arrived today doing fairly well. She reports having increased LBP today to 4/10. She was able to performnustep and core activation/strengthening exs without increased pain. She had notable tightness  in Bil QLs and tenderness Bil SIJ's. MH was tolerated well with normal response.    Stability/Clinical Decision Making  Stable/Uncomplicated    Rehab Potential  Excellent    PT Frequency  2x / week    PT Duration  6 weeks    PT Treatment/Interventions  ADLs/Self Care Home Management;Moist Heat;Therapeutic exercise;Therapeutic activities;Patient/family education;Manual techniques;Passive range of motion    PT Next Visit Plan  STW/M to bilateral SIJ's and QL's, HMP, Core exercises.    Consulted and Agree with Plan of Care  Patient       Patient will benefit from skilled therapeutic intervention in order to improve the following deficits and impairments:  Abnormal gait, Pain, Decreased activity tolerance  Visit Diagnosis: 1. Acute bilateral low back pain without sciatica        Problem List Patient Active Problem List   Diagnosis Date Noted  . MDD (major depressive disorder), single episode, moderate ([place]) 06/23/2019  . Chronic systolic (congestive) heart failure ([place]) 01/28/2018  . Upper airway cough syndrome 01/19/2018  . Pulmonary infiltrates on CXR 01/19/2018  . Migraines 10/08/2017  . Sleep apnea with use of continuous positive airway pressure (  CPAP) 09/24/2017  . Congestive heart failure (CHF) ([place]) 10/09/2016  . Respiratory distress 10/09/2016  . Rheumatoid arthritis ([place]) 10/09/2016  . Asthma 10/09/2016  . Chronic pain 10/09/2016  . Heart failure ([place]) 10/09/2016  . Congestive heart  failure ([facility])   . Chest pain 07/03/2016  . Lupus (systemic lupus erythematosus) ([place])   . Diabetes mellitus with complication ([place])   . Anxiety state   . GERD (gastroesophageal reflux disease) 12/28/2015  . Essential hypertension 12/26/2015  . Type 2 diabetes mellitus ([place]) 12/26/2015  . Chronic systolic heart failure ([place]) 08/18/2014  . Cardiomyopathy, dilated ([place]) 01/27/2014  . Hypokalemia 01/27/2014  . Shortness of breath 01/26/2014  . SLE (systemic lupus erythematosus) ([place]) 01/26/2014    RAMSEUR,CHRIS, PTA 08/02/2019, 4:31 PM  [facility] [place], [place], [address] Phone: [phone]   Fax:  [phone]  Name: Christine Mcdowell MRN: [id number] Date of Birth: Dec 15, 1971

## 2019-08-03 NOTE — Progress Notes (Signed)
EPIC Encounter for ICM Monitoring  Patient Name: Christine Mcdowell is a 48 y.o. female Date: 08/03/2019 Primary Care Physican: [name], MD Primary Cardiologist:Bensimhon Electrophysiologist:Taylor 05/07/2019 Weight:184lbs  Transmission reviewed.HF clinic OV tomorrow, 08/04/2019.  07/31/2019 HeartLogic Heart Failure Index remains at 12 but has not reached recovery threshold of 6.  Prescribed:Torsemide20 mg take1tablet (20 mg total)daily and may increase to 60 mg daily for excess fluid.Metolazone 2.5 mg takeone tablet as needed for weight gain of 5 lbs within 3 days.   Labs: 06/18/2019 Creatinine 0.85, BUN 9, Potassium 3.4, Sodium 143, GFR >60 03/29/2019 Creatinine 1.15, BUN 14, Potassium 5.2, Sodium 137, GFR 56-64 02/16/2019 Creatinine 0.98, BUN 10, Potassium 3.6, Sodium 142, GFR >60 10/17/2018 Creatinine 1.10, BUN 15, Potassium 4.0, Sodium 140, GFR 58->60 08/18/2018 Creatinine 1.65, BUN 19, Potassium 3.1, Sodium 142, GFR 36-41 05/20/2018 Creatinine 1.03, BUN 14, Potassium 3.9, Sodium 140, GFR >60  Recommendations:None  Follow-up plan: ICM clinic phone appointment on 08/31/2019.  Office appt with Dr Haroldine Laws 08/04/2019.  Copy of ICM check sent to Dr. Lovena Le.              [name], RN 08/03/2019 9:43 AM

## 2019-08-04 ENCOUNTER — Ambulatory Visit (HOSPITAL_COMMUNITY)
Admission: RE | Admit: 2019-08-04 | Discharge: 2019-08-04 | Disposition: A | Discharge status: Home / Self Care | Payer: Medicare Other | Source: Ambulatory Visit | Attending: Internal Medicine | Admitting: Internal Medicine

## 2019-08-04 ENCOUNTER — Encounter: Payer: Medicare Other | Admitting: Physical Therapy

## 2019-08-04 ENCOUNTER — Other Ambulatory Visit: Payer: Self-pay

## 2019-08-04 VITALS — BP 148/92 | HR 82 | Wt 188.6 lb

## 2019-08-04 DIAGNOSIS — Z881 Allergy status to other antibiotic agents status: Secondary | ICD-10-CM | POA: Insufficient documentation

## 2019-08-04 DIAGNOSIS — Z7982 Long term (current) use of aspirin: Secondary | ICD-10-CM | POA: Insufficient documentation

## 2019-08-04 DIAGNOSIS — E119 Type 2 diabetes mellitus without complications: Secondary | ICD-10-CM | POA: Insufficient documentation

## 2019-08-04 DIAGNOSIS — I110 Hypertensive heart disease with heart failure: Secondary | ICD-10-CM | POA: Insufficient documentation

## 2019-08-04 DIAGNOSIS — Z791 Long term (current) use of non-steroidal anti-inflammatories (NSAID): Secondary | ICD-10-CM | POA: Insufficient documentation

## 2019-08-04 DIAGNOSIS — G4730 Sleep apnea, unspecified: Secondary | ICD-10-CM | POA: Diagnosis not present

## 2019-08-04 DIAGNOSIS — G4733 Obstructive sleep apnea (adult) (pediatric): Secondary | ICD-10-CM | POA: Insufficient documentation

## 2019-08-04 DIAGNOSIS — Z794 Long term (current) use of insulin: Secondary | ICD-10-CM | POA: Diagnosis not present

## 2019-08-04 DIAGNOSIS — Z888 Allergy status to other drugs, medicaments and biological substances status: Secondary | ICD-10-CM | POA: Insufficient documentation

## 2019-08-04 DIAGNOSIS — Z8673 Personal history of transient ischemic attack (TIA), and cerebral infarction without residual deficits: Secondary | ICD-10-CM | POA: Insufficient documentation

## 2019-08-04 DIAGNOSIS — Z8541 Personal history of malignant neoplasm of cervix uteri: Secondary | ICD-10-CM | POA: Diagnosis not present

## 2019-08-04 DIAGNOSIS — R002 Palpitations: Secondary | ICD-10-CM | POA: Insufficient documentation

## 2019-08-04 DIAGNOSIS — M069 Rheumatoid arthritis, unspecified: Secondary | ICD-10-CM | POA: Insufficient documentation

## 2019-08-04 DIAGNOSIS — I428 Other cardiomyopathies: Secondary | ICD-10-CM | POA: Diagnosis not present

## 2019-08-04 DIAGNOSIS — Z8249 Family history of ischemic heart disease and other diseases of the circulatory system: Secondary | ICD-10-CM | POA: Insufficient documentation

## 2019-08-04 DIAGNOSIS — E785 Hyperlipidemia, unspecified: Secondary | ICD-10-CM | POA: Diagnosis not present

## 2019-08-04 DIAGNOSIS — Z90710 Acquired absence of both cervix and uterus: Secondary | ICD-10-CM | POA: Diagnosis not present

## 2019-08-04 DIAGNOSIS — I10 Essential (primary) hypertension: Secondary | ICD-10-CM

## 2019-08-04 DIAGNOSIS — Z95810 Presence of automatic (implantable) cardiac defibrillator: Secondary | ICD-10-CM | POA: Insufficient documentation

## 2019-08-04 DIAGNOSIS — M797 Fibromyalgia: Secondary | ICD-10-CM | POA: Diagnosis not present

## 2019-08-04 DIAGNOSIS — J45909 Unspecified asthma, uncomplicated: Secondary | ICD-10-CM | POA: Insufficient documentation

## 2019-08-04 DIAGNOSIS — D573 Sickle-cell trait: Secondary | ICD-10-CM | POA: Diagnosis not present

## 2019-08-04 DIAGNOSIS — I5022 Chronic systolic (congestive) heart failure: Secondary | ICD-10-CM | POA: Insufficient documentation

## 2019-08-04 DIAGNOSIS — Z885 Allergy status to narcotic agent status: Secondary | ICD-10-CM | POA: Insufficient documentation

## 2019-08-04 DIAGNOSIS — M329 Systemic lupus erythematosus, unspecified: Secondary | ICD-10-CM | POA: Insufficient documentation

## 2019-08-04 DIAGNOSIS — Z79899 Other long term (current) drug therapy: Secondary | ICD-10-CM | POA: Diagnosis not present

## 2019-08-04 LAB — BASIC METABOLIC PANEL WITH GFR
Anion gap: 9 (ref 5–15)
BUN: 16 mg/dL (ref 6–20)
CO2: 24 mmol/L (ref 22–32)
Calcium: 8.5 mg/dL — ABNORMAL LOW (ref 8.9–10.3)
Chloride: 107 mmol/L (ref 98–111)
Creatinine, Ser: 0.75 mg/dL (ref 0.44–1.00)
GFR calc Af Amer: >60 mL/min
GFR calc non Af Amer: >60 mL/min
Glucose, Bld: 109 mg/dL — ABNORMAL HIGH (ref 70–99)
Potassium: 3.7 mmol/L (ref 3.5–5.1)
Sodium: 140 mmol/L (ref 135–145)

## 2019-08-04 LAB — BRAIN NATRIURETIC PEPTIDE: B Natriuretic Peptide: 41.5 pg/mL (ref 0.0–100.0)

## 2019-08-04 MED ORDER — HYDRALAZINE HCL 50 MG PO TABS: 50.0000 mg | ORAL_TABLET | Freq: Three times a day (TID) | ORAL | 6 refills | Status: DC

## 2019-08-04 MED ORDER — SACUBITRIL-VALSARTAN 97-103 MG PO TABS: 1.0000 | ORAL_TABLET | Freq: Two times a day (BID) | ORAL | 6 refills | Status: DC

## 2019-08-04 MED ORDER — TORSEMIDE 20 MG PO TABS: ORAL_TABLET | ORAL | 6 refills | Status: DC

## 2019-08-04 NOTE — Patient Instructions (Signed)
Labs done today. We will call you for any abnormal results.  Repeat Labs in 2 weeks  Decrease Hydralazine To 50mg  (1 tab ) three times a day.  Increase Entresto to 97/103. One tablet 2 times a day.  Increase Torsemide 20mg  to 1 tablet  Monday, Wednesday and Friday   Your physician requested you where a Zio Patch for 14 days to monitor your heart rhythm. Follow the instructions in the booklet.   Your provider has recommended that you have a home sleep study.  [employer] is the company that provides these and will send the equipment right to your home with instructions on how to set it up.  Once you have completed the test you just dispose of the equipment, the information is automatically uploaded to [place].  IF you have any questions or issues with the equipment please call the company.  If your test was positive and you need a home CPAP machine you will be contacted by Dr Theodosia Blender office [facility]) to set this up.  Your physician recommends that you schedule a follow-up appointment in: 3 months with an Echo  Your physician has requested that you have an echocardiogram. Echocardiography is a painless test that uses sound waves to create images of your heart. It provides your doctor with information about the size and shape of your heart and how well your heart's chambers and valves are working. This procedure takes approximately one hour. There are no restrictions for this procedure.  At the [facility], you and your health needs are our priority. As part of our continuing mission to provide you with exceptional heart care, we have created designated Provider Care Teams. These Care Teams include your primary Cardiologist (physician) and Advanced Practice Providers (APPs- Physician Assistants and Nurse Practitioners) who all work together to provide you with the care you need, when you need it.   You may see any of the following providers on your designated Care Team at your next  follow up: Marland Kitchen Dr Glori Bickers . Dr Loralie Champagne . Darrick Grinder, NP   Please be sure to bring in all your medications bottles to every appointment.

## 2019-08-04 NOTE — Progress Notes (Signed)
Zio Patch placed and instruction provided. Patient verbalized understanding.

## 2019-08-04 NOTE — Progress Notes (Addendum)
Patient Name: Derica Leiber        DOB: 11/13/1971      Height: [place] Name:[facility]. Advance Heart Failure         Referring Provider:Bensimhon  Today's Date:08/04/2019  Date:   STOP BANG RISK ASSESSMENT S (snore) Have you been told that you snore?     YES   T (tired) Are you often tired, fatigued, or sleepy during the day?   YES  O (obstruction) Do you stop breathing, choke, or gasp during sleep? YES   P (pressure) Do you have or are you being treated for high blood pressure? YES   B (BMI) Is your body index greater than 35 kg/m? YES   A (age) Are you 1 years old or older? YES   N (neck) Do you have a neck circumference greater than 16 inches?   N/A   G (gender) Are you a female? NO   TOTAL STOP/BANG "YES" ANSWERS 6                                                                       For Office Use Only              Procedure Order Form    YES to 3+ Stop Bang questions OR two clinical symptoms - patient qualifies for WatchPAT (CPT 95800)     Submit: This Form + Patient Face Sheet + Clinical Note via CloudPAT or Fax: [phone]         Clinical Notes: Will consult Sleep Specialist and refer for management of therapy due to patient increased risk of Sleep Apnea. Ordering a sleep study due to the following two clinical symptoms: Excessive daytime sleepiness G47.10 / Gastroesophageal reflux K21.9 / Nocturia R35.1 / Morning Headaches G44.221 / Difficulty concentrating R41.840 / Memory problems or poor judgment G31.84 / Personality changes or irritability R45.4 / Loud snoring R06.83 / Depression F32.9 / Unrefreshed by sleep G47.8 / Impotence N52.9 / History of high blood pressure R03.0 / Insomnia G47.00    I understand that I am proceeding with a home sleep apnea test as ordered by my treating physician. I understand that untreated sleep apnea is a serious cardiovascular risk factor and it is my responsibility to perform the test  and seek management for sleep apnea. I will be contacted with the results and be managed for sleep apnea by a local sleep physician. I will be receiving equipment and further instructions from [facility]. I shall promptly ship back the equipment via the included mailing label. I understand my insurance will be billed for the test and as the patient I am responsible for any insurance related out-of-pocket costs incurred. I have been provided with written instructions and can call for additional video or telephonic instruction, with 24-hour availability of qualified personnel to answer any questions: Patient Help Desk [phone].  Patient Signature ______________________________________________________   Date______________________ Patient Telemedicine Verbal Consent

## 2019-08-04 NOTE — Progress Notes (Signed)
Heart Failure Clinic Note  ID:  Christine Mcdowell, Christine Mcdowell 08/25/1971, MRN [id number]  Location: Home  Provider location: [address] [place] Type of Visit: Established patient   PCP:  [name], MD  Cardiologist:  Glori Bickers, MD  Chief Complaint: Low blood pressure, fatigue   History of Present Illness: Christine Mcdowell is a 48 y.o. 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   Admitted in October 2017 with increased dyspnea and volume overload. Diuresed with IV lasix and transitioned to lasix 40 mg twice a day. LHC/RHC normal filling pressures, EF ~20%, and normal coronaries. Discharge weight 184 pounds.   Admitted to [facility] in September 2019  for ACL repair. Also received IV lasix during hospital admit.   Echo 02/12/19, which showed EF 40-45%, RV normal.   Recent CPX 9/19 with relaltively normal spirometry. Mild HF limitation.   Had R TKA 3/20  And getting around better.   Here for routine f/u. Says she still feels very fatigued. Had a lupus flare recently. Steroids tapered off on Sunday. Rheumatologist felt she was volume overloaded. Took metolazone 3-4x over 2 weeks.  Still with DOE. Has some palpitations at night but night HR on device < 90 consistently. SBP 110-120. Still with R-sided CP.   HeartLogic score 12 (sable). Thoracic impedence is trending down, but still not at baseline currently. No VT/VF. Active 2.9 hours daily.    Cardiac MRI in 02/2015 showed EF 46%. No LGE.  Past Medical History:  Diagnosis Date  . AICD (automatic cardioverter/defibrillator) present 01/28/2018  . Anemia   . Anginal pain ([place])   . Asthma   . Cervical cancer ([place])   . CHF (congestive heart failure) ([place])   . Diabetes mellitus without complication ([place])    steroid induced  . Discoid lupus   .  Fibromyalgia   . History of blood transfusion "several"   "related to anemia; had some w/hysterectomy also"  . Hx of cardiovascular stress test    ETT-Myoview (9/15):  No ischemia, EF 52%; NORMAL  . Hx of echocardiogram    Echo (9/15):  EF 50-55%, ant HK, Gr 1 DD, mild MR, mild LAE, no effusion  . Hypertension   . Iron deficiency anemia    h/o iron transfusions  . Lupus (systemic lupus erythematosus) ([place])   . Migraine    "a few/year" (07/03/2016)  . Pneumonia 12/2015  . RA (rheumatoid arthritis) ([place])    "all over" (07/03/2016)  . Sickle cell trait ([place])   . Stroke ([place]) 2014 X 1; 2015 X 2; 2016 X 1;    "right side of face more relaxed than the other; rare speech hesitation" (07/03/2016)  . Vaginal Pap smear, abnormal    ASCUS; HPV   Past Surgical History:  Procedure Laterality Date  . ABDOMINAL HYSTERECTOMY  2009  . ABDOMINAL WOUND DEHISCENCE  2009  . CARDIAC CATHETERIZATION N/A 10/11/2016   Procedure: Right/Left Heart Cath and Coronary Angiography;  Surgeon: Jolaine Artist, MD;  Location: [facility];  Service: Cardiovascular;  Laterality: N/A;  . DILATION AND CURETTAGE OF UTERUS  1991  . HEMATOMA EVACUATION  2009   abdomen  . ICD IMPLANT  01/28/2018  . ICD IMPLANT N/A 01/28/2018   Procedure: ICD IMPLANT;  Surgeon: Evans Lance, MD;  Location: [facility];  Service: Cardiovascular;  Laterality: N/A;  .  INCISE AND DRAIN ABCESS  2009 X 2   "abdomen after hysterectomy"  . KNEE ARTHROSCOPY Right 1997  . KNEE SURGERY Right   . TUBAL LIGATION  1996     Current Outpatient Medications  Medication Sig Dispense Refill  . Abatacept (ORENCIA) 125 MG/ML SOSY Inject into the skin once a week.    Marland Kitchen ACCU-CHEK FASTCLIX LANCETS MISC USE TO PRICK FINGER TO TEST BLOOD SUGAR BEFORE BREAKFAST AND DINNER  10  . albuterol (ACCUNEB) 1.25 MG/3ML nebulizer solution Take 3 mLs (1.25 mg total) by nebulization every 6 (six) hours as needed for wheezing. 75 mL 12  . aspirin 325 MG  EC tablet Take 162 mg by mouth daily.     . carvedilol (COREG) 25 MG tablet TAKE 1 TABLET(25 MG) BY MOUTH TWICE DAILY WITH A MEAL 180 tablet 0  . Continuous Blood Gluc Receiver (DEXCOM G6 RECEIVER) DEVI 1 Device by Does not apply route 3 (three) times daily before meals. 1 Device 1  . Continuous Blood Gluc Sensor (DEXCOM G6 SENSOR) MISC Inject 1 each into the skin 3 (three) times daily. 3 each 1  . Continuous Blood Gluc Transmit (DEXCOM G6 TRANSMITTER) MISC 1 Device by Does not apply route 3 (three) times daily before meals. 1 each 1  . folic acid (FOLVITE) 1 MG tablet TAKE 1 TABLET BY MOUTH EVERY MORNING (Patient taking differently: Take 1 mg by mouth daily. ) 30 tablet 0  . hydrALAZINE (APRESOLINE) 50 MG tablet Take 1.5 tablets (75 mg total) by mouth 3 (three) times daily. 405 tablet 3  . hydroxychloroquine (PLAQUENIL) 200 MG tablet Take 1 tablet (200 mg total) by mouth 2 (two) times daily. 180 tablet 0  . ipratropium (ATROVENT) 0.03 % nasal spray Place 2 sprays into the nose 3 (three) times daily as needed for rhinitis. 30 mL 2  . meloxicam (MOBIC) 15 MG tablet Take 15 mg by mouth daily.     . metFORMIN (GLUCOPHAGE) 500 MG tablet TAKE 1 TABLET BY MOUTH EVERY DAY WITH THE MORNING AND EVENING MEAL 180 tablet 3  . methotrexate (RHEUMATREX) 2.5 MG tablet Take 20 mg by mouth every Friday.   3  . metolazone (ZAROXOLYN) 2.5 MG tablet TAKE 1 TABLET BY MOUTH AS NEEDED FOR WEIGHT GAIN OF 5 POUNDS WITHIN 3 DAYS AS DIRECTED 5 tablet 3  . montelukast (SINGULAIR) 10 MG tablet TAKE 1 TABLET BY MOUTH DAILY 90 tablet 1  . Multiple Vitamin (MULTIVITAMIN WITH MINERALS) TABS Take 1 tablet by mouth every morning.     . ondansetron (ZOFRAN) 4 MG tablet Take 1 tablet (4 mg total) by mouth every 8 (eight) hours as needed for nausea or vomiting. 20 tablet 0  . PROAIR HFA 108 (90 Base) MCG/ACT inhaler INHALE 2 PUFFS BY MOUTH EVERY DAY AS NEEDED FOR SHORTNESS OF BREATH 8.5 g 0  . sacubitril-valsartan (ENTRESTO) 49-51 MG  Take 1 tablet by mouth 2 (two) times daily. 180 tablet 3  . spironolactone (ALDACTONE) 25 MG tablet Take 1 tablet (25 mg total) by mouth every evening. 90 tablet 3  . torsemide (DEMADEX) 20 MG tablet Take 1 tablet (20 mg total) by mouth as needed. May increase to 60mg  if excess fluid 90 tablet 2  . traMADol (ULTRAM) 50 MG tablet Take 1 tablet (50 mg total) by mouth every 6 (six) hours as needed. 20 tablet 0  . TRESIBA FLEXTOUCH 100 UNIT/ML SOPN FlexTouch Pen INJECT 12 UNIT INTO THE SKIN EVERY NIGHT AT BEDTIME PER INSULIN PROTOCOL 15  mL 3  . Vitamin D, Ergocalciferol, (DRISDOL) 1.25 MG (50000 UT) CAPS capsule Take 1 capsule (50,000 Units total) by mouth 2 (two) times a week. Every Tuesday and Friday 16 capsule 2   No current facility-administered medications for this encounter.     Allergies:   Hydromorphone, Iodinated diagnostic agents, Other, Erythromycin, Latex, Tape, and Mircette [desogestrel-ethinyl estradiol]   Social History:  The patient  reports that she has never smoked. She has never used smokeless tobacco. She reports that she does not drink alcohol or use drugs.   Family History:  The patient's family history includes Allergies in her father and mother; Arthritis in her mother; Asthma in her maternal grandmother; Cancer in her paternal grandfather; Cushing syndrome in her father; Dementia in her paternal grandmother; Depression in her father; Diabetes in her maternal grandmother; Drug abuse in her mother; Heart attack in her father and maternal grandfather; Heart murmur in her mother; Hypertension in her maternal grandmother.   ROS:  Please see the history of present illness.   All other systems are personally reviewed and negative.   Vitals:   08/04/19 0853  BP: (!) 148/92  Pulse: 82  SpO2: 98%      Exam:   General:  Well appearing. No resp difficulty HEENT: normal Neck: supple. JVP 7. Carotids 2+ bilat; no bruits. No lymphadenopathy or thryomegaly appreciated. Cor: PMI  nondisplaced. Regular rate & rhythm. No rubs, gallops or murmurs. Lungs: clear Abdomen: soft, nontender, nondistended. No hepatosplenomegaly. No bruits or masses. Good bowel sounds. Extremities: no cyanosis, clubbing, rash, trace  edema Neuro: alert & orientedx3, cranial nerves grossly intact. moves all 4 extremities w/o difficulty. Affect pleasant   Recent Labs: 10/17/2018: B Natriuretic Peptide 72.3 03/29/2019: TSH 0.750 06/18/2019: ALT 12; BUN 9; Creatinine, Ser 0.83; Creatinine, Ser 0.85; Hemoglobin 10.5; Platelets 248; Potassium 3.4; Sodium 143  Personally reviewed   Wt Readings from Last 3 Encounters:  08/04/19 85.5 kg (188 lb 9.6 oz)  05/07/19 83.7 kg (184 lb 8 oz)  03/29/19 84.4 kg (186 lb)      ASSESSMENT AND PLAN:  1. Chronic Systolic Heart Failure - NICM Cath 10/11/16 with normal cors. CPX 10/2016: Peak VO2: 18.5 (82% predicted peak VO2), VE/VCO2 slope: 30. ECHO 12/18 EF 25-30%  - S/P [employer] HeartLogic ICD 01/2018.  - Cardiac MRI in 02/2015 showed EF 46%. No LGE. - Repeat CPX 9/19 with mild HR limitation - Echo 02/12/19: EF 40-45% - Stable NYHA II - III - Volume status up and down. Still not at baseline. Will add back torsemide 20 MWF. Continue metolazone prn as needed - Continue spiro 25 mg qHS.  - Continue coreg 25 mg BID - Increase Entresto 97/103 mg BID  - Decrease hydralazine to 50 mg TID. Not on imdur due to headaches.  - ICD interrogated personally. No VT. HL score 12. Volume slightly up  - She has been consented for cardiomems, but will plan to monitor her with HeartLogic device for now unless she has frequent hospitalizations for CHF - RTC 3 months with echo  2. Sinus tach vs atrial flutter on ICD - Still with palpitations but no VT on ICD - Will place ZioPatch to look for atrial arrhythmias  3. HTN -> Hypotension - BP high here but ok at home. Changes as above 4. OSA - Not wearing CPAP. OSA improved with weight loss.  - Repeat sleep study    Signed, Glori Bickers, MD  08/04/2019 9:21 AM   [place]  [address] [place] [address] [phone] (office) [phone] (fax)

## 2019-08-09 ENCOUNTER — Telehealth (HOSPITAL_COMMUNITY): Payer: Self-pay | Admitting: *Deleted

## 2019-08-09 NOTE — Telephone Encounter (Signed)
She wore zio patch for 3 days.  I asked her to mail the zio monitor back and we will call her once we get results.

## 2019-08-09 NOTE — Telephone Encounter (Signed)
Pt left VM stating her  zio patch will not stay on and that it has irritated her skin so bad some of her skin pulled off. Pt wants to know if there is another test or monitor she can use that won't irritate her skin.   Routed to [place] for advice.

## 2019-08-09 NOTE — Telephone Encounter (Signed)
Did she wear it at least a few days? May be sufficient.

## 2019-08-10 ENCOUNTER — Encounter: Payer: Self-pay | Admitting: Nurse Practitioner

## 2019-08-14 ENCOUNTER — Emergency Department (HOSPITAL_COMMUNITY)
Admission: EM | Admit: 2019-08-14 | Discharge: 2019-08-14 | Disposition: A | Discharge status: Home / Self Care | Payer: Medicare Other | Attending: Emergency Medicine | Admitting: Emergency Medicine

## 2019-08-14 ENCOUNTER — Other Ambulatory Visit: Payer: Self-pay

## 2019-08-14 ENCOUNTER — Telehealth: Payer: Self-pay | Admitting: Internal Medicine

## 2019-08-14 ENCOUNTER — Emergency Department (HOSPITAL_COMMUNITY): Payer: Medicare Other

## 2019-08-14 ENCOUNTER — Encounter (HOSPITAL_COMMUNITY): Payer: Self-pay | Admitting: *Deleted

## 2019-08-14 DIAGNOSIS — R531 Weakness: Secondary | ICD-10-CM | POA: Diagnosis not present

## 2019-08-14 DIAGNOSIS — I110 Hypertensive heart disease with heart failure: Secondary | ICD-10-CM | POA: Insufficient documentation

## 2019-08-14 DIAGNOSIS — E119 Type 2 diabetes mellitus without complications: Secondary | ICD-10-CM | POA: Diagnosis not present

## 2019-08-14 DIAGNOSIS — I951 Orthostatic hypotension: Secondary | ICD-10-CM | POA: Insufficient documentation

## 2019-08-14 DIAGNOSIS — Z95810 Presence of automatic (implantable) cardiac defibrillator: Secondary | ICD-10-CM | POA: Insufficient documentation

## 2019-08-14 DIAGNOSIS — I5022 Chronic systolic (congestive) heart failure: Secondary | ICD-10-CM | POA: Diagnosis not present

## 2019-08-14 DIAGNOSIS — Z8673 Personal history of transient ischemic attack (TIA), and cerebral infarction without residual deficits: Secondary | ICD-10-CM | POA: Diagnosis not present

## 2019-08-14 DIAGNOSIS — Z7984 Long term (current) use of oral hypoglycemic drugs: Secondary | ICD-10-CM | POA: Diagnosis not present

## 2019-08-14 DIAGNOSIS — Z79899 Other long term (current) drug therapy: Secondary | ICD-10-CM | POA: Diagnosis not present

## 2019-08-14 DIAGNOSIS — Z91040 Latex allergy status: Secondary | ICD-10-CM | POA: Diagnosis not present

## 2019-08-14 DIAGNOSIS — M069 Rheumatoid arthritis, unspecified: Secondary | ICD-10-CM | POA: Diagnosis not present

## 2019-08-14 DIAGNOSIS — R079 Chest pain, unspecified: Secondary | ICD-10-CM | POA: Diagnosis not present

## 2019-08-14 DIAGNOSIS — Z7982 Long term (current) use of aspirin: Secondary | ICD-10-CM | POA: Insufficient documentation

## 2019-08-14 DIAGNOSIS — I959 Hypotension, unspecified: Secondary | ICD-10-CM | POA: Diagnosis not present

## 2019-08-14 LAB — BASIC METABOLIC PANEL
Anion gap: 13 (ref 5–15)
BUN: 10 mg/dL (ref 6–20)
CO2: 22 mmol/L (ref 22–32)
Calcium: 9.2 mg/dL (ref 8.9–10.3)
Chloride: 103 mmol/L (ref 98–111)
Creatinine, Ser: 1.03 mg/dL — ABNORMAL HIGH (ref 0.44–1.00)
GFR calc Af Amer: >60 mL/min (ref >60)
GFR calc non Af Amer: >60 mL/min (ref >60)
Glucose, Bld: 110 mg/dL — ABNORMAL HIGH (ref 70–99)
Potassium: 3.6 mmol/L (ref 3.5–5.1)
Sodium: 138 mmol/L (ref 135–145)

## 2019-08-14 LAB — TROPONIN I (HIGH SENSITIVITY): Troponin I (High Sensitivity): 3 ng/L (ref <18)

## 2019-08-14 LAB — CBC
HCT: 35 % — ABNORMAL LOW (ref 36.0–46.0)
Hemoglobin: 11.1 g/dL — ABNORMAL LOW (ref 12.0–15.0)
MCH: 26.6 pg (ref 26.0–34.0)
MCHC: 31.7 g/dL (ref 30.0–36.0)
MCV: 83.9 fL (ref 80.0–100.0)
Platelets: 221 10*3/uL (ref 150–400)
RBC: 4.17 MIL/uL (ref 3.87–5.11)
RDW: 14.7 % (ref 11.5–15.5)
WBC: 4.7 10*3/uL (ref 4.0–10.5)
nRBC: 0 % (ref 0.0–0.2)

## 2019-08-14 LAB — I-STAT BETA HCG BLOOD, ED (MC, WL, AP ONLY): I-stat hCG, quantitative: <5 m[IU]/mL (ref <5)

## 2019-08-14 MED ORDER — SODIUM CHLORIDE 0.9% FLUSH: 3.0000 mL | Freq: Once | INTRAVENOUS | Status: DC

## 2019-08-14 NOTE — ED Provider Notes (Signed)
[facility] DEPARTMENT Provider Note   CSN: [id number] Arrival date & time: 08/14/19  0207     History   Chief Complaint Chief Complaint  Patient presents with  . Hypotension    HPI Christine Mcdowell is a 48 y.o. female.     HPI  48 yo female ho chf presents with two episode of low bp.  Felt lightheaded and very sleepy. BP meds changed entresto 97 /103 (increase), hydralazine decreased ( to 1 pill tid), torsemide restarted m,w,f to "help get the fluid off".  Patient took her torsemide yesterday.  She has been lightheaded when standing since that time.  She has been on heart monitor but took off last week.  Patient states she has been ok until yesterday.  Noted palpitations at night which has been ongoing for two weeks.  Endorses chest pain when went to doctor last week with "light ones since last week."  Pain occurs bottom left chest sharp pain lasts 30 seconds occurs every 3-4 times inpast week.  No identified precipitating factors,  DX chf 2015 and has had these pains since then had pacemaker defib present.  She does not think her defibrillator has gone off- present for 1 1/2 yrs.   PMD [facility] Dr. Bryon Lions  Past Medical History:  Diagnosis Date  . AICD (automatic cardioverter/defibrillator) present 01/28/2018  . Anemia   . Anginal pain ([place])   . Asthma   . Cervical cancer ([place])   . CHF (congestive heart failure) ([place])   . Diabetes mellitus without complication ([place])    steroid induced  . Discoid lupus   . Fibromyalgia   . History of blood transfusion "several"   "related to anemia; had some w/hysterectomy also"  . Hx of cardiovascular stress test    ETT-Myoview (9/15):  No ischemia, EF 52%; NORMAL  . Hx of echocardiogram    Echo (9/15):  EF 50-55%, ant HK, Gr 1 DD, mild MR, mild LAE, no effusion  . Hypertension   . Iron deficiency anemia    h/o iron transfusions  . Lupus (systemic lupus erythematosus) ([place])   . Migraine    "a few/year" (07/03/2016)  . Pneumonia 12/2015  . RA (rheumatoid arthritis) ([place])    "all over" (07/03/2016)  . Sickle cell trait ([place])   . Stroke ([place]) 2014 X 1; 2015 X 2; 2016 X 1;    "right side of face more relaxed than the other; rare speech hesitation" (07/03/2016)  . Vaginal Pap smear, abnormal    ASCUS; HPV    Patient Active Problem List   Diagnosis Date Noted  . MDD (major depressive disorder), single episode, moderate ([place]) 06/23/2019  . Chronic systolic (congestive) heart failure ([place]) 01/28/2018  . Upper airway cough syndrome 01/19/2018  . Pulmonary infiltrates on CXR 01/19/2018  . Migraines 10/08/2017  . Sleep apnea with use of continuous positive airway pressure (CPAP) 09/24/2017  . Congestive heart failure (CHF) ([place]) 10/09/2016  . Respiratory distress 10/09/2016  . Rheumatoid arthritis ([place]) 10/09/2016  . Asthma 10/09/2016  . Chronic pain 10/09/2016  . Heart failure ([place]) 10/09/2016  . Congestive heart failure ([place])   . Chest pain 07/03/2016  . Lupus (systemic lupus erythematosus) ([place])   . Diabetes mellitus with complication ([place])   . Anxiety state   . GERD (gastroesophageal reflux disease) 12/28/2015  . Essential hypertension 12/26/2015  . Type 2 diabetes mellitus ([place]) 12/26/2015  . Chronic systolic heart failure ([place]) 08/18/2014  . Cardiomyopathy, dilated ([place])  01/27/2014  . Hypokalemia 01/27/2014  . Shortness of breath 01/26/2014  . SLE (systemic lupus erythematosus) ([place]) 01/26/2014    Past Surgical History:  Procedure Laterality Date  . ABDOMINAL HYSTERECTOMY  2009  . ABDOMINAL WOUND DEHISCENCE  2009  . CARDIAC CATHETERIZATION N/A 10/11/2016   Procedure: Right/Left Heart Cath and Coronary Angiography;  Surgeon: Jolaine Artist, MD;  Location: [facility];  Service: Cardiovascular;  Laterality: N/A;  . DILATION AND CURETTAGE OF UTERUS  1991  . HEMATOMA EVACUATION  2009   abdomen  . ICD IMPLANT  01/28/2018  . ICD IMPLANT N/A 01/28/2018    Procedure: ICD IMPLANT;  Surgeon: Evans Lance, MD;  Location: [facility];  Service: Cardiovascular;  Laterality: N/A;  . INCISE AND DRAIN ABCESS  2009 X 2   "abdomen after hysterectomy"  . KNEE ARTHROSCOPY Right 1997  . KNEE SURGERY Right   . TUBAL LIGATION  1996     OB History    Gravida  5   Para  4   Term  3   Preterm  1   AB  1   Living  4     SAB  1   TAB      Ectopic      Multiple      Live Births  4            Home Medications    Prior to Admission medications   Medication Sig Start Date End Date Taking? Authorizing Provider  Abatacept (ORENCIA) 125 MG/ML SOSY Inject into the skin once a week.    [provider]  ACCU-CHEK FASTCLIX LANCETS MISC USE TO PRICK FINGER TO TEST BLOOD SUGAR BEFORE BREAKFAST AND DINNER 08/31/18   [provider]  albuterol (ACCUNEB) 1.25 MG/3ML nebulizer solution Take 3 mLs (1.25 mg total) by nebulization every 6 (six) hours as needed for wheezing. 03/04/16   Lacretia Leigh, MD  aspirin 325 MG EC tablet Take 162 mg by mouth daily.     [provider]  carvedilol (COREG) 25 MG tablet TAKE 1 TABLET(25 MG) BY MOUTH TWICE DAILY WITH A MEAL 06/07/19   Bensimhon, Shaune Pascal, MD  Continuous Blood Gluc Receiver (DEXCOM G6 RECEIVER) DEVI 1 Device by Does not apply route 3 (three) times daily before meals. 07/29/19   [name], FNP  Continuous Blood Gluc Sensor (DEXCOM G6 SENSOR) MISC Inject 1 each into the skin 3 (three) times daily. 07/26/19   [name], MD  Continuous Blood Gluc Transmit (DEXCOM G6 TRANSMITTER) MISC 1 Device by Does not apply route 3 (three) times daily before meals. 07/26/19   [name], MD  folic acid (FOLVITE) 1 MG tablet TAKE 1 TABLET BY MOUTH EVERY MORNING Patient taking differently: Take 1 mg by mouth daily.  01/30/17   Bensimhon, Shaune Pascal, MD  hydrALAZINE (APRESOLINE) 50 MG tablet Take 1 tablet (50 mg total) by mouth 3 (three) times daily. 08/04/19   Bensimhon, Shaune Pascal, MD   hydroxychloroquine (PLAQUENIL) 200 MG tablet Take 1 tablet (200 mg total) by mouth 2 (two) times daily. 02/02/19   Bensimhon, Shaune Pascal, MD  ipratropium (ATROVENT) 0.03 % nasal spray Place 2 sprays into the nose 3 (three) times daily as needed for rhinitis. 06/23/19 06/22/20  [name], FNP  meloxicam (MOBIC) 15 MG tablet Take 15 mg by mouth daily.     [provider]  metFORMIN (GLUCOPHAGE) 500 MG tablet TAKE 1 TABLET BY MOUTH EVERY DAY WITH THE MORNING  AND EVENING MEAL 05/03/19   [name], FNP  methotrexate (RHEUMATREX) 2.5 MG tablet Take 20 mg by mouth every Friday.  02/06/15   [provider]  metolazone (ZAROXOLYN) 2.5 MG tablet TAKE 1 TABLET BY MOUTH AS NEEDED FOR WEIGHT GAIN OF 5 POUNDS WITHIN 3 DAYS AS DIRECTED 08/18/18   Bensimhon, Shaune Pascal, MD  montelukast (SINGULAIR) 10 MG tablet TAKE 1 TABLET BY MOUTH DAILY 06/07/19   [name], FNP  Multiple Vitamin (MULTIVITAMIN WITH MINERALS) TABS Take 1 tablet by mouth every morning.     [provider]  ondansetron (ZOFRAN) 4 MG tablet Take 1 tablet (4 mg total) by mouth every 8 (eight) hours as needed for nausea or vomiting. 12/30/15   Robbie Lis, MD  PROAIR HFA 108 [phone] Base) MCG/ACT inhaler INHALE 2 PUFFS BY MOUTH EVERY DAY AS NEEDED FOR SHORTNESS OF BREATH 06/30/19   Tanda Rockers, MD  sacubitril-valsartan (ENTRESTO) 97-103 MG Take 1 tablet by mouth 2 (two) times daily. 08/04/19   Bensimhon, Shaune Pascal, MD  spironolactone (ALDACTONE) 25 MG tablet Take 1 tablet (25 mg total) by mouth every evening. 04/23/19   Georgiana Shore, NP  torsemide [facility]) 20 MG tablet Take 1 tablet Monday, Wednesday , and Friday 08/04/19   Bensimhon, Shaune Pascal, MD  traMADol (ULTRAM) 50 MG tablet Take 1 tablet (50 mg total) by mouth every 6 (six) hours as needed. 06/23/19 06/22/20  [name], FNP  TRESIBA FLEXTOUCH 100 UNIT/ML SOPN FlexTouch Pen INJECT 12 UNIT INTO THE SKIN EVERY NIGHT AT BEDTIME PER INSULIN PROTOCOL 06/30/19   [name], FNP   Vitamin D, Ergocalciferol, (DRISDOL) 1.25 MG (50000 UT) CAPS capsule Take 1 capsule (50,000 Units total) by mouth 2 (two) times a week. Every Tuesday and Friday 05/10/19   [name], FNP    Family History Family History  Problem Relation Age of Onset  . Arthritis Mother   . Heart murmur Mother   . Drug abuse Mother   . Allergies Mother   . Heart attack Father   . Cushing syndrome Father   . Depression Father   . Allergies Father   . Dementia Paternal Grandmother   . Cancer Paternal Grandfather   . Diabetes Maternal Grandmother   . Hypertension Maternal Grandmother   . Asthma Maternal Grandmother   . Heart attack Maternal Grandfather     Social History Social History   Tobacco Use  . Smoking status: Never Smoker  . Smokeless tobacco: Never Used  Substance Use Topics  . Alcohol use: No  . Drug use: No     Allergies   Hydromorphone, Iodinated diagnostic agents, Other, Erythromycin, Latex, Tape, and Mircette [desogestrel-ethinyl estradiol]   Review of Systems Review of Systems  Constitutional: Positive for fatigue and unexpected weight change.  HENT: Negative.   Eyes: Negative.        Cataract removed february  Respiratory: Positive for cough and shortness of breath.        Dry cough with chf  Cardiovascular: Positive for chest pain, palpitations and leg swelling.  Gastrointestinal: Negative.   Endocrine: Negative.   Genitourinary: Negative.   Musculoskeletal: Negative.   Skin: Negative.   Allergic/Immunologic: Negative.   Neurological: Positive for dizziness.  Hematological: Negative.   Psychiatric/Behavioral: Negative.   All other systems reviewed and are negative.    Physical Exam Updated Vital Signs BP 112/74 (BP Location: Right Arm)   Pulse 75   Temp 98.1 F (36.7 C) (Oral)   Resp 16  Ht 1.676 m (5\' 6" )   Wt 87.1 kg   SpO2 100%   BMI 30.99 kg/m   Physical Exam Vitals signs and nursing note reviewed.  Constitutional:      General: She  is not in acute distress.    Appearance: Normal appearance. She is obese. She is not ill-appearing.  HENT:     Head: Normocephalic and atraumatic.     Right Ear: External ear normal.     Left Ear: External ear normal.     Nose: Nose normal.     Mouth/Throat:     Mouth: Mucous membranes are moist.  Eyes:     Extraocular Movements: Extraocular movements intact.     Pupils: Pupils are equal, round, and reactive to light.  Neck:     Musculoskeletal: Normal range of motion.  Cardiovascular:     Rate and Rhythm: Normal rate and regular rhythm.  Pulmonary:     Effort: Pulmonary effort is normal.  Abdominal:     General: Abdomen is flat.     Palpations: Abdomen is soft.  Musculoskeletal: Normal range of motion.        General: No swelling or deformity.  Skin:    General: Skin is warm.     Capillary Refill: Capillary refill takes less than 2 seconds.  Neurological:     General: No focal deficit present.     Mental Status: She is alert and oriented to person, place, and time.     Cranial Nerves: No cranial nerve deficit.     Motor: No weakness.     Gait: Gait normal.  Psychiatric:        Mood and Affect: Mood normal.        Behavior: Behavior normal.      ED Treatments / Results  Labs (all labs ordered are listed, but only abnormal results are displayed) Labs Reviewed  BASIC METABOLIC PANEL - Abnormal; Notable for the following components:      Result Value   Glucose, Bld 110 (*)    Creatinine, Ser 1.03 (*)    All other components within normal limits  CBC - Abnormal; Notable for the following components:   Hemoglobin 11.1 (*)    HCT 35.0 (*)    All other components within normal limits  I-STAT BETA HCG BLOOD, ED ([facility], [facility], [facility] ONLY)  TROPONIN I (HIGH SENSITIVITY)  TROPONIN I (HIGH SENSITIVITY)    EKG EKG Interpretation  Date/Time:  Saturday August 14 2019 02:21:59 EDT Ventricular Rate:  76 PR Interval:  218 QRS Duration: 86 QT Interval:  414 QTC Calculation: 465  R Axis:   -28 Text Interpretation:  Normal sinus rhythm No significant change since last tracing Confirmed by Pattricia Boss [phone]) on 08/14/2019 7:29:49 AM   Radiology Dg Chest 2 View  Result Date: 08/14/2019 CLINICAL DATA:  Hypotension.  Chest pain. EXAM: CHEST - 2 VIEW COMPARISON:  10/17/2018 FINDINGS: The patient's ICD is unchanged in position. The heart size is enlarged but not significantly changed from prior study. There is persistent atelectasis at the left lung base. There is no pneumothorax. No acute osseous abnormality. IMPRESSION: No active cardiopulmonary disease. Electronically Signed   By: Constance Holster M.D.   On: 08/14/2019 03:17    Procedures Procedures (including critical care time)  Medications Ordered in ED Medications  sodium chloride flush (NS) 0.9 % injection 3 mL (has no administration in time range)     Initial Impression / Assessment and Plan / ED Course  I  have reviewed the triage vital signs and the nursing notes.  Pertinent labs & imaging results that were available during my care of the patient were reviewed by me and considered in my medical decision making (see chart for details).        48 year old female with known CHF who has had recent changes in her medication that included increasing her diuretics.  She subsequently had some lightheadedness.  Here in the ED her EKG is unchanged from prior as is her chest x-ray.  Her labs reveal mildly increased creatinine and hemoglobin which would be consistent with her diuresis.  Here in the ED her blood pressures of 118/64 with a heart rate of 68.  She and her husband are in the room.  They report good access to the congestive heart clinic and Dr. Haroldine Laws.  Plan to hold her Demadex which is not due again until Monday. She discussed some episodes of sharp chest pain which have been present and are not new- doubt ischemia with normal trop and unchanged ekg given symptoms that she describes.  She is instructed  regarding any symptoms of lightheadedness or changes that should cause her to sit down immediately.  Any worsening symptoms that should cause her to return to the emergency department.  She is instructed to call the office on Monday for further instructions regarding her medications.  Final Clinical Impressions(s) / ED Diagnoses   Final diagnoses:  Weakness  Orthostatic hypotension  Medication therapy changed    ED Discharge Orders    None       Pattricia Boss, MD 08/14/19 [phone]

## 2019-08-14 NOTE — ED Notes (Signed)
Pt spouse, Alfredia Ferguson, [phone]

## 2019-08-14 NOTE — ED Triage Notes (Signed)
The pt reports that she had dizziness earlier today and her bp dropped then and tonight after she ate dinner  At present tired and has no energy. No pain now

## 2019-08-14 NOTE — Discharge Instructions (Addendum)
Please rest this weekend Call clinic (CHF) on Monday to discuss medication changes Return if you are worse, especially increasing chest pain or light headedness

## 2019-08-14 NOTE — ED Notes (Signed)
Pt waiting outside with family member.

## 2019-08-14 NOTE — Telephone Encounter (Signed)
Patient's daughter called answering service to report that her mother was experiencing significant shortness of breath as well as low BP (100s/70s). Symptoms began earlier today. Given description of significant, ongoing dyspnea advised daughter to call EMS for transport to [facility] for evaluation. Will alert Dr. Sung Amabile.   Lauren K. Marletta Lor, MD

## 2019-08-16 ENCOUNTER — Telehealth (HOSPITAL_COMMUNITY): Payer: Self-pay | Admitting: *Deleted

## 2019-08-16 DIAGNOSIS — R002 Palpitations: Secondary | ICD-10-CM | POA: Diagnosis not present

## 2019-08-16 NOTE — Telephone Encounter (Signed)
   I personally called her.   She reported that her blood pressure was very low for her at 106/70 and she felt dizzy. She said she can not tolerate her blood pressure that low and she felt like she was going to lose consciousness.   She does not want to resume torsemide and will discuss at her follow up on Wednesday. She also wanted to make sure that we understand she can not tolerate blood pressure at that level.   I am documenting this so providers will know when SBP is < 110 she is symptomatic.   Amy Clegg NP-C  2:56 PM

## 2019-08-16 NOTE — Telephone Encounter (Signed)
Pt was seen in the ED Friday for dizziness and chest pain.  Pt was told to hold torsemide and to call our office for further instructions. Pt normally takes torsemide 20mg  Monday, Wednesday, and Friday. Pt has an office visit Wednesday. Please advise.  Routed to Darrick Grinder, NP for advice

## 2019-08-17 ENCOUNTER — Telehealth: Payer: Self-pay

## 2019-08-17 NOTE — Telephone Encounter (Signed)
PA SENT TO PLAN FOR [facility] THROUGH [facility] MEDICARE

## 2019-08-18 ENCOUNTER — Other Ambulatory Visit: Payer: Self-pay

## 2019-08-18 ENCOUNTER — Ambulatory Visit (HOSPITAL_COMMUNITY)
Admission: RE | Admit: 2019-08-18 | Discharge: 2019-08-18 | Disposition: A | Discharge status: Home / Self Care | Payer: Medicare Other | Source: Ambulatory Visit | Attending: Internal Medicine | Admitting: Internal Medicine

## 2019-08-18 DIAGNOSIS — I5022 Chronic systolic (congestive) heart failure: Secondary | ICD-10-CM | POA: Diagnosis not present

## 2019-08-18 DIAGNOSIS — E1169 Type 2 diabetes mellitus with other specified complication: Secondary | ICD-10-CM

## 2019-08-18 LAB — BASIC METABOLIC PANEL
Anion gap: 9 (ref 5–15)
BUN: 9 mg/dL (ref 6–20)
CO2: 26 mmol/L (ref 22–32)
Calcium: 9.6 mg/dL (ref 8.9–10.3)
Chloride: 107 mmol/L (ref 98–111)
Creatinine, Ser: 1.01 mg/dL — ABNORMAL HIGH (ref 0.44–1.00)
GFR calc Af Amer: >60 mL/min (ref >60)
GFR calc non Af Amer: >60 mL/min (ref >60)
Glucose, Bld: 119 mg/dL — ABNORMAL HIGH (ref 70–99)
Potassium: 4.3 mmol/L (ref 3.5–5.1)
Sodium: 142 mmol/L (ref 135–145)

## 2019-08-18 LAB — BRAIN NATRIURETIC PEPTIDE: B Natriuretic Peptide: 50 pg/mL (ref 0.0–100.0)

## 2019-08-18 MED ORDER — BLOOD GLUCOSE MONITOR KIT: PACK | 3 refills | Status: DC

## 2019-08-20 ENCOUNTER — Encounter: Payer: Self-pay | Admitting: Nurse Practitioner

## 2019-08-24 ENCOUNTER — Ambulatory Visit (INDEPENDENT_AMBULATORY_CARE_PROVIDER_SITE_OTHER): Payer: Medicare Other | Admitting: Nurse Practitioner

## 2019-08-24 ENCOUNTER — Encounter: Payer: Self-pay | Admitting: Nurse Practitioner

## 2019-08-24 ENCOUNTER — Other Ambulatory Visit: Payer: Self-pay

## 2019-08-24 VITALS — BP 118/80 | HR 71 | Temp 98.2°F | Ht 67.0 in | Wt 184.6 lb

## 2019-08-24 DIAGNOSIS — I10 Essential (primary) hypertension: Secondary | ICD-10-CM

## 2019-08-24 DIAGNOSIS — Z0000 Encounter for general adult medical examination without abnormal findings: Secondary | ICD-10-CM

## 2019-08-24 DIAGNOSIS — Z23 Encounter for immunization: Secondary | ICD-10-CM

## 2019-08-24 DIAGNOSIS — M25511 Pain in right shoulder: Secondary | ICD-10-CM

## 2019-08-24 DIAGNOSIS — Z1211 Encounter for screening for malignant neoplasm of colon: Secondary | ICD-10-CM

## 2019-08-24 DIAGNOSIS — I110 Hypertensive heart disease with heart failure: Secondary | ICD-10-CM | POA: Diagnosis not present

## 2019-08-24 DIAGNOSIS — M329 Systemic lupus erythematosus, unspecified: Secondary | ICD-10-CM | POA: Diagnosis not present

## 2019-08-24 DIAGNOSIS — Z1239 Encounter for other screening for malignant neoplasm of breast: Secondary | ICD-10-CM

## 2019-08-24 DIAGNOSIS — I5022 Chronic systolic (congestive) heart failure: Secondary | ICD-10-CM | POA: Diagnosis not present

## 2019-08-24 DIAGNOSIS — Z113 Encounter for screening for infections with a predominantly sexual mode of transmission: Secondary | ICD-10-CM

## 2019-08-24 DIAGNOSIS — E1169 Type 2 diabetes mellitus with other specified complication: Secondary | ICD-10-CM | POA: Diagnosis not present

## 2019-08-24 DIAGNOSIS — Z79899 Other long term (current) drug therapy: Secondary | ICD-10-CM

## 2019-08-24 DIAGNOSIS — M79601 Pain in right arm: Secondary | ICD-10-CM

## 2019-08-24 LAB — POCT URINALYSIS DIPSTICK
Bilirubin, UA: NEGATIVE
Blood, UA: NEGATIVE
Glucose, UA: NEGATIVE
Leukocytes, UA: NEGATIVE
Nitrite, UA: NEGATIVE
Protein, UA: NEGATIVE
Spec Grav, UA: 1.025 (ref 1.010–1.025)
Urobilinogen, UA: 0.2 E.U./dL
pH, UA: 5.5 (ref 5.0–8.0)

## 2019-08-24 LAB — POCT UA - MICROALBUMIN
Albumin/Creatinine Ratio, Urine, POC: 30
Creatinine, POC: 300 mg/dL
Microalbumin Ur, POC: 10 mg/L

## 2019-08-24 MED ORDER — MOMETASONE FUROATE 50 MCG/ACT NA SUSP: 2.0000 | Freq: Every day | NASAL | 2 refills | Status: DC

## 2019-08-24 MED ORDER — TETANUS-DIPHTH-ACELL PERTUSSIS 5-2.5-18.5 LF-MCG/0.5 IM SUSP
0.5000 mL | Freq: Once | INTRAMUSCULAR | Status: AC
  Administered 2019-08-24: 0.5 mL via INTRAMUSCULAR

## 2019-08-24 NOTE — Progress Notes (Addendum)
Subjective:     Patient ID: Christine Mcdowell , female    DOB: 1971/04/26 , 48 y.o.   MRN: [id number]   Chief Complaint  Patient presents with  . Medicare Wellness    HPI  Here today for her welcome to medicare.    She is now on [place]  Her old glucometer she had it ba  Diabetes She presents for her follow-up diabetic visit. She has type 2 (she is now in her 2nd week of her prednisone taper started about 10 weeks ago.  She was advised by Dr. Eliberto Ivory to start back on the regimen she was in.) diabetes mellitus. There are no hypoglycemic associated symptoms. There are no diabetic associated symptoms. Pertinent negatives for diabetes include no fatigue, no polydipsia, no polyphagia and no polyuria. There are no hypoglycemic complications. Pertinent negatives for diabetic complications include no CVA. Risk factors for coronary artery disease include obesity and sedentary lifestyle. Current diabetic treatment includes oral agent (dual therapy). (Highest blood sugar up to 389.  )     Past Medical History:  Diagnosis Date  . AICD (automatic cardioverter/defibrillator) present 01/28/2018  . Anemia   . Anginal pain ([place])   . Asthma   . Cervical cancer ([place])   . CHF (congestive heart failure) ([place])   . Diabetes mellitus without complication ([place])    steroid induced  . Discoid lupus   . Fibromyalgia   . History of blood transfusion "several"   "related to anemia; had some w/hysterectomy also"  . Hx of cardiovascular stress test    ETT-Myoview (9/15):  No ischemia, EF 52%; NORMAL  . Hx of echocardiogram    Echo (9/15):  EF 50-55%, ant HK, Gr 1 DD, mild MR, mild LAE, no effusion  . Hypertension   . Iron deficiency anemia    h/o iron transfusions  . Lupus (systemic lupus erythematosus) ([place])   . Migraine    "a few/year" (07/03/2016)  . Pneumonia 12/2015  . RA (rheumatoid arthritis) ([place])    "all over" (07/03/2016)  . Sickle cell trait ([place])   . Stroke ([place]) 2014 X 1; 2015 X 2; 2016 X  1;    "right side of face more relaxed than the other; rare speech hesitation" (07/03/2016)  . Vaginal Pap smear, abnormal    ASCUS; HPV     Family History  Problem Relation Age of Onset  . Arthritis Mother   . Heart murmur Mother   . Drug abuse Mother   . Allergies Mother   . Heart attack Father   . Cushing syndrome Father   . Depression Father   . Allergies Father   . Dementia Paternal Grandmother   . Cancer Paternal Grandfather   . Diabetes Maternal Grandmother   . Hypertension Maternal Grandmother   . Asthma Maternal Grandmother   . Heart attack Maternal Grandfather      Current Outpatient Medications:  .  Abatacept (ORENCIA) 125 MG/ML SOSY, Inject into the skin once a week., Disp: , Rfl:  .  albuterol (ACCUNEB) 1.25 MG/3ML nebulizer solution, Take 3 mLs (1.25 mg total) by nebulization every 6 (six) hours as needed for wheezing., Disp: 75 mL, Rfl: 12 .  amoxicillin (AMOXIL) 500 MG capsule, Take 500 mg by mouth daily. Take 1 tablet daily for 4 days, Disp: , Rfl:  .  aspirin 81 MG EC tablet, Take by mouth daily. Taking 2 tablets, Disp: , Rfl:  .  blood glucose meter kit and supplies KIT, Dispense based on patient and  insurance preference. Use up to four times daily as directed. (FOR ICD-9 250.00, 250.01). FILL BASED ON INSURANCE PREFERENCE., Disp: 1 each, Rfl: 3 .  carvedilol (COREG) 25 MG tablet, TAKE 1 TABLET(25 MG) BY MOUTH TWICE DAILY WITH A MEAL, Disp: 180 tablet, Rfl: 0 .  Continuous Blood Gluc Sensor (DEXCOM G6 SENSOR) MISC, Inject 1 each into the skin 3 (three) times daily., Disp: 3 each, Rfl: 1 .  Continuous Blood Gluc Transmit (DEXCOM G6 TRANSMITTER) MISC, 1 Device by Does not apply route 3 (three) times daily before meals., Disp: 1 each, Rfl: 1 .  diclofenac sodium (VOLTAREN) 1 % GEL, Apply 1 g topically as needed., Disp: , Rfl:  .  folic acid (FOLVITE) 1 MG tablet, TAKE 1 TABLET BY MOUTH EVERY MORNING (Patient taking differently: Take 1 mg by mouth daily. ), Disp: 30  tablet, Rfl: 0 .  hydrALAZINE (APRESOLINE) 50 MG tablet, Take 1 tablet (50 mg total) by mouth 3 (three) times daily., Disp: 270 tablet, Rfl: 6 .  hydroxychloroquine (PLAQUENIL) 200 MG tablet, Take 1 tablet (200 mg total) by mouth 2 (two) times daily., Disp: 180 tablet, Rfl: 0 .  ipratropium (ATROVENT) 0.03 % nasal spray, Place 2 sprays into the nose 3 (three) times daily as needed for rhinitis., Disp: 30 mL, Rfl: 2 .  Magnesium 200 MG TABS, Take 1 tablet by mouth daily., Disp: , Rfl:  .  meloxicam (MOBIC) 15 MG tablet, Take 15 mg by mouth daily. , Disp: , Rfl:  .  metFORMIN (GLUCOPHAGE) 500 MG tablet, TAKE 1 TABLET BY MOUTH EVERY DAY WITH THE MORNING AND EVENING MEAL, Disp: 180 tablet, Rfl: 3 .  methotrexate (RHEUMATREX) 2.5 MG tablet, Take 20 mg by mouth every Friday. , Disp: , Rfl: 3 .  metolazone (ZAROXOLYN) 2.5 MG tablet, TAKE 1 TABLET BY MOUTH AS NEEDED FOR WEIGHT GAIN OF 5 POUNDS WITHIN 3 DAYS AS DIRECTED, Disp: 5 tablet, Rfl: 3 .  montelukast (SINGULAIR) 10 MG tablet, TAKE 1 TABLET BY MOUTH DAILY, Disp: 90 tablet, Rfl: 1 .  Multiple Vitamin (MULTIVITAMIN WITH MINERALS) TABS, Take 1 tablet by mouth every morning. , Disp: , Rfl:  .  ondansetron (ZOFRAN) 4 MG tablet, Take 1 tablet (4 mg total) by mouth every 8 (eight) hours as needed for nausea or vomiting., Disp: 20 tablet, Rfl: 0 .  predniSONE (DELTASONE) 20 MG tablet, Take 20 mg by mouth daily with breakfast., Disp: , Rfl:  .  PROAIR HFA 108 (90 Base) MCG/ACT inhaler, INHALE 2 PUFFS BY MOUTH EVERY DAY AS NEEDED FOR SHORTNESS OF BREATH, Disp: 8.5 g, Rfl: 0 .  sacubitril-valsartan (ENTRESTO) 97-103 MG, Take 1 tablet by mouth 2 (two) times daily., Disp: 60 tablet, Rfl: 6 .  spironolactone (ALDACTONE) 25 MG tablet, Take 1 tablet (25 mg total) by mouth every evening., Disp: 90 tablet, Rfl: 3 .  torsemide (DEMADEX) 20 MG tablet, Take 1 tablet Monday, Wednesday , and Friday, Disp: 15 tablet, Rfl: 6 .  traMADol (ULTRAM) 50 MG tablet, Take 1 tablet  (50 mg total) by mouth every 6 (six) hours as needed., Disp: 20 tablet, Rfl: 0 .  traZODone (DESYREL) 50 MG tablet, Take 50 mg by mouth at bedtime., Disp: , Rfl:  .  TRESIBA FLEXTOUCH 100 UNIT/ML SOPN FlexTouch Pen, INJECT 12 UNIT INTO THE SKIN EVERY NIGHT AT BEDTIME PER INSULIN PROTOCOL, Disp: 15 mL, Rfl: 3 .  Vitamin D, Ergocalciferol, (DRISDOL) 1.25 MG (50000 UT) CAPS capsule, Take 1 capsule (50,000 Units total) by mouth  2 (two) times a week. Every Tuesday and Friday, Disp: 16 capsule, Rfl: 2 .  ACCU-CHEK FASTCLIX LANCETS MISC, USE TO PRICK FINGER TO TEST BLOOD SUGAR BEFORE BREAKFAST AND DINNER, Disp: , Rfl: 10 .  Continuous Blood Gluc Receiver (DEXCOM G6 RECEIVER) DEVI, 1 Device by Does not apply route 3 (three) times daily before meals., Disp: 1 Device, Rfl: 1   Allergies  Allergen Reactions  . Hydromorphone Nausea And Vomiting    Other reaction(s): GI Upset (intolerance), Hypertension (intolerance) Raises blood pressure  Other reaction(s): GI Upset (intolerance), Hypertension (intolerance) Raises blood pressure to stroke level  . Iodinated Diagnostic Agents Other (See Comments)    Shuts down kidneys Shuts kidney function down  . Other Other (See Comments) and Anaphylaxis    Spicy foods and seasonings Skin Prep "makes my skin peel off" Paper tape causes skin burns  . Erythromycin Nausea And Vomiting  . Latex Hives  . Tape Other (See Comments)    "Skin burns"  . Mircette [Desogestrel-Ethinyl Estradiol] Nausea And Vomiting and Rash     Review of Systems  Constitutional: Negative.  Negative for fatigue.  HENT: Negative.   Eyes: Negative.   Respiratory: Negative.   Cardiovascular: Negative.   Gastrointestinal: Negative.   Endocrine: Negative for cold intolerance, heat intolerance, polydipsia, polyphagia and polyuria.  Genitourinary: Negative.   Musculoskeletal:       Right shoulder pain  Skin: Negative.   Allergic/Immunologic: Negative.   Neurological: Negative.    Hematological: Negative.   Psychiatric/Behavioral: Negative.      Today's Vitals   08/24/19 1600  BP: 118/80  Pulse: 71  Temp: 98.2 F (36.8 C)  TempSrc: Oral  Weight: 184 lb 9.6 oz (83.7 kg)  Height: '5\' 7"'$  (1.702 m)  PainSc: 0-No pain   Body mass index is 28.91 kg/m.   Objective:  Physical Exam Constitutional:      Appearance: Normal appearance.  Cardiovascular:     Rate and Rhythm: Normal rate and regular rhythm.     Pulses: Normal pulses.     Heart sounds: Normal heart sounds. No murmur.  Skin:    General: Skin is warm and dry.     Capillary Refill: Capillary refill takes less than 2 seconds.  Neurological:     General: No focal deficit present.     Mental Status: She is alert and oriented to person, place, and time.         Assessment And Plan:     1. Type 2 diabetes mellitus with other specified complication, without long-term current use of insulin (HCC)  She is checking her blood sugar before and after each meal and morning, noon and night.    Will refer to CCM for assistance  Blood sugar has increased due to her being back on steroids - Referral to Chronic Care Management Services  2. Need for influenza vaccination  Influenza vaccine given in office  Advised to take Tylenol as needed for muscle aches or fever - Flu Vaccine QUAD 6+ mos PF IM (Fluarix Quad PF)  3. Essential hypertension Chronic, good control Continue current medications  4. Systemic lupus erythematosus, unspecified SLE type, unspecified organ involvement status (Falls) Chronic, she is being followed by rheumatology  5. Chronic systolic heart failure (HCC)  This is stable and continues with follow up with Dr Horton Marshall  6. Other long term (current) drug therapy  7. Acute pain of right shoulder  Mild pain with range of motion, negative crepitus  8. Encounter for screening for  malignant neoplasm of breast  Pt instructed on Self Breast Exam.According to ACOG guidelines Women aged  16 and older are recommended to get an annual mammogram. Form completed and given to patient contact the [facility] for appointment scheduing.   Pt encouraged to get annual mammogram - MM Digital Screening; Future  9. Encounter for screening colonoscopy  According to USPTF Colorectal cancer Screening guidelines. Colonoscopy is recommended every 10 years, starting at age 26years.  Will refer to GI for colon cancer screening. - Ambulatory referral to Gastroenterology  10. Screening examination for STD (sexually transmitted disease)  - HIV antibody (with reflex)  11. Right arm pain  Will check xray of humerus  - DG Humerus Right; Future  12. Tetanus toxoid vaccination administered at current visit  Will give tetanus vaccine today while in office. Refer to order management. TDAP will be administered to adults 37-60 years old every 10 years. - Tdap (BOOSTRIX) injection 0.5 mL  13. Encounter for Medicare annual wellness exam  Pt's annual wellness exam was performed and geriatric assessment reviewed.   Pt has no new identiafble wellness concerns at this time.   WIll obtain routine labs.   Will obtain UA and micro.   Behavior modifications discussed and diet history reviewed. Pt will continue to exercise regularly and modify diet, with low GI, plant based foods and decrease food intake of processed foods.   Recommend intake of daily multivitamin, Vitamin D, and calcium.  Recommond mammogram and colonoscopy for preventive screenings, as well as recommend immunizations that include influenza (up to date)      [name], FNP    THE PATIENT IS ENCOURAGED TO PRACTICE SOCIAL DISTANCING DUE TO THE COVID-19 PANDEMIC.     Subjective:    Jaedyn Marrufo is a 48 y.o. female who presents for a Welcome to Medicare exam.   Review of Systems See above Cardiac Risk Factors include: diabetes mellitus;sedentary lifestyle;hypertension      Objective:    Today's Vitals    08/24/19 1600  BP: 118/80  Pulse: 71  Temp: 98.2 F (36.8 C)  TempSrc: Oral  Weight: 184 lb 9.6 oz (83.7 kg)  Height: '5\' 7"'$  (1.702 m)  PainSc: 0-No pain  Body mass index is 28.91 kg/m.  Medications Outpatient Encounter Medications as of 08/24/2019  Medication Sig  . Abatacept (ORENCIA) 125 MG/ML SOSY Inject into the skin once a week.  Marland Kitchen albuterol (ACCUNEB) 1.25 MG/3ML nebulizer solution Take 3 mLs (1.25 mg total) by nebulization every 6 (six) hours as needed for wheezing.  Marland Kitchen amoxicillin (AMOXIL) 500 MG capsule Take 500 mg by mouth daily. Take 1 tablet daily for 4 days  . aspirin 81 MG EC tablet Take by mouth daily. Taking 2 tablets  . blood glucose meter kit and supplies KIT Dispense based on patient and insurance preference. Use up to four times daily as directed. (FOR ICD-9 250.00, 250.01). FILL BASED ON INSURANCE PREFERENCE.  . carvedilol (COREG) 25 MG tablet TAKE 1 TABLET(25 MG) BY MOUTH TWICE DAILY WITH A MEAL  . Continuous Blood Gluc Sensor (DEXCOM G6 SENSOR) MISC Inject 1 each into the skin 3 (three) times daily.  . Continuous Blood Gluc Transmit (DEXCOM G6 TRANSMITTER) MISC 1 Device by Does not apply route 3 (three) times daily before meals.  . diclofenac sodium (VOLTAREN) 1 % GEL Apply 1 g topically as needed.  . folic acid (FOLVITE) 1 MG tablet TAKE 1 TABLET BY MOUTH EVERY MORNING (Patient taking differently: Take 1 mg by mouth  daily. )  . hydrALAZINE (APRESOLINE) 50 MG tablet Take 1 tablet (50 mg total) by mouth 3 (three) times daily.  . hydroxychloroquine (PLAQUENIL) 200 MG tablet Take 1 tablet (200 mg total) by mouth 2 (two) times daily.  Marland Kitchen ipratropium (ATROVENT) 0.03 % nasal spray Place 2 sprays into the nose 3 (three) times daily as needed for rhinitis.  . Magnesium 200 MG TABS Take 1 tablet by mouth daily.  . meloxicam (MOBIC) 15 MG tablet Take 15 mg by mouth daily.   . metFORMIN (GLUCOPHAGE) 500 MG tablet TAKE 1 TABLET BY MOUTH EVERY DAY WITH THE MORNING AND EVENING  MEAL  . methotrexate (RHEUMATREX) 2.5 MG tablet Take 20 mg by mouth every Friday.   . metolazone (ZAROXOLYN) 2.5 MG tablet TAKE 1 TABLET BY MOUTH AS NEEDED FOR WEIGHT GAIN OF 5 POUNDS WITHIN 3 DAYS AS DIRECTED  . montelukast (SINGULAIR) 10 MG tablet TAKE 1 TABLET BY MOUTH DAILY  . Multiple Vitamin (MULTIVITAMIN WITH MINERALS) TABS Take 1 tablet by mouth every morning.   . ondansetron (ZOFRAN) 4 MG tablet Take 1 tablet (4 mg total) by mouth every 8 (eight) hours as needed for nausea or vomiting.  . predniSONE (DELTASONE) 20 MG tablet Take 20 mg by mouth daily with breakfast.  . PROAIR HFA 108 (90 Base) MCG/ACT inhaler INHALE 2 PUFFS BY MOUTH EVERY DAY AS NEEDED FOR SHORTNESS OF BREATH  . sacubitril-valsartan (ENTRESTO) 97-103 MG Take 1 tablet by mouth 2 (two) times daily.  Marland Kitchen spironolactone (ALDACTONE) 25 MG tablet Take 1 tablet (25 mg total) by mouth every evening.  . torsemide (DEMADEX) 20 MG tablet Take 1 tablet Monday, Wednesday , and Friday  . traMADol (ULTRAM) 50 MG tablet Take 1 tablet (50 mg total) by mouth every 6 (six) hours as needed.  . traZODone (DESYREL) 50 MG tablet Take 50 mg by mouth at bedtime.  . TRESIBA FLEXTOUCH 100 UNIT/ML SOPN FlexTouch Pen INJECT 12 UNIT INTO THE SKIN EVERY NIGHT AT BEDTIME PER INSULIN PROTOCOL  . Vitamin D, Ergocalciferol, (DRISDOL) 1.25 MG (50000 UT) CAPS capsule Take 1 capsule (50,000 Units total) by mouth 2 (two) times a week. Every Tuesday and Friday  . ACCU-CHEK FASTCLIX LANCETS MISC USE TO PRICK FINGER TO TEST BLOOD SUGAR BEFORE BREAKFAST AND DINNER  . Continuous Blood Gluc Receiver (DEXCOM G6 RECEIVER) DEVI 1 Device by Does not apply route 3 (three) times daily before meals.   No facility-administered encounter medications on file as of 08/24/2019.      History: Past Medical History:  Diagnosis Date  . AICD (automatic cardioverter/defibrillator) present 01/28/2018  . Anemia   . Anginal pain ([place])   . Asthma   . Cervical cancer ([place])   . CHF  (congestive heart failure) ([place])   . Diabetes mellitus without complication ([place])    steroid induced  . Discoid lupus   . Fibromyalgia   . History of blood transfusion "several"   "related to anemia; had some w/hysterectomy also"  . Hx of cardiovascular stress test    ETT-Myoview (9/15):  No ischemia, EF 52%; NORMAL  . Hx of echocardiogram    Echo (9/15):  EF 50-55%, ant HK, Gr 1 DD, mild MR, mild LAE, no effusion  . Hypertension   . Iron deficiency anemia    h/o iron transfusions  . Lupus (systemic lupus erythematosus) ([place])   . Migraine    "a few/year" (07/03/2016)  . Pneumonia 12/2015  . RA (rheumatoid arthritis) ([place])    "all over" (07/03/2016)  .  Sickle cell trait ([place])   . Stroke ([place]) 2014 X 1; 2015 X 2; 2016 X 1;    "right side of face more relaxed than the other; rare speech hesitation" (07/03/2016)  . Vaginal Pap smear, abnormal    ASCUS; HPV   Past Surgical History:  Procedure Laterality Date  . ABDOMINAL HYSTERECTOMY  2009  . ABDOMINAL WOUND DEHISCENCE  2009  . CARDIAC CATHETERIZATION N/A 10/11/2016   Procedure: Right/Left Heart Cath and Coronary Angiography;  Surgeon: Jolaine Artist, MD;  Location: [facility];  Service: Cardiovascular;  Laterality: N/A;  . DILATION AND CURETTAGE OF UTERUS  1991  . HEMATOMA EVACUATION  2009   abdomen  . ICD IMPLANT  01/28/2018  . ICD IMPLANT N/A 01/28/2018   Procedure: ICD IMPLANT;  Surgeon: Evans Lance, MD;  Location: [facility];  Service: Cardiovascular;  Laterality: N/A;  . INCISE AND DRAIN ABCESS  2009 X 2   "abdomen after hysterectomy"  . KNEE ARTHROSCOPY Right 1997  . KNEE SURGERY Right   . TUBAL LIGATION  1996    Family History  Problem Relation Age of Onset  . Arthritis Mother   . Heart murmur Mother   . Drug abuse Mother   . Allergies Mother   . Heart attack Father   . Cushing syndrome Father   . Depression Father   . Allergies Father   . Dementia Paternal Grandmother   . Cancer Paternal  Grandfather   . Diabetes Maternal Grandmother   . Hypertension Maternal Grandmother   . Asthma Maternal Grandmother   . Heart attack Maternal Grandfather    Social History   Occupational History  . Not on file  Tobacco Use  . Smoking status: Never Smoker  . Smokeless tobacco: Never Used  Substance and Sexual Activity  . Alcohol use: No  . Drug use: No  . Sexual activity: Not Currently    Birth control/protection: Surgical    Comment: hyst    Tobacco Counseling Counseling given: Not Answered   Immunizations and Health Maintenance Immunization History  Administered Date(s) Administered  . Influenza, High Dose Seasonal PF 09/02/2018  . Influenza, Seasonal, Injecte, Preservative Fre 11/06/2017  . Influenza,inj,Quad PF,6+ Mos 11/06/2017, 08/24/2019  . Influenza-Unspecified 08/08/2017   Health Maintenance Due  Topic Date Due  . PNEUMOCOCCAL POLYSACCHARIDE VACCINE AGE 64-64 HIGH RISK  01/10/1971  . FOOT EXAM  01/10/1979  . OPHTHALMOLOGY EXAM  01/10/1979  . HIV Screening  01/11/1984  . TETANUS/TDAP  01/11/1988  . PAP SMEAR-Modifier  01/10/1990  . MAMMOGRAM  01/10/2019  . COLONOSCOPY  01/10/2019  . INFLUENZA VACCINE  07/24/2019    Activities of Daily Living In your present state of health, do you have any difficulty performing the following activities: 08/24/2019  Hearing? N  Vision? Y  Comment has implants and glasses from cataract removal  Difficulty concentrating or making decisions? N  Walking or climbing stairs? Y  Comment gets winded going up and down stairs  Dressing or bathing? N  Doing errands, shopping? N  Preparing Food and eating ? N  Using the Toilet? N  In the past six months, have you accidently leaked urine? N  Do you have problems with loss of bowel control? N  Managing your Medications? N  Managing your Finances? N  Housekeeping or managing your Housekeeping? N  Some recent data might be hidden    Physical Exam  (optional), or other factors  deemed appropriate based on the beneficiary's medical and  social history and current clinical standards.  Advanced Directives: Does Patient Have a Medical Advance Directive?: No    Assessment:    This is a routine wellness examination for this patient .  Vision/Hearing screen  Hearing Screening   '125Hz'$  '250Hz'$  '500Hz'$  '1000Hz'$  '2000Hz'$  '3000Hz'$  '4000Hz'$  '6000Hz'$  '8000Hz'$   Right ear:   20 40 20  20    Left ear:   20 40 20  20      Visual Acuity Screening   Right eye Left eye Both eyes  Without correction:     With correction: '20/15 20/15 20/15 '$    Dietary issues and exercise activities discussed:  Exercise limited by: cardiac condition(s);orthopedic condition(s)  Goals    . pain control and diabetes control     She would like to have less pain related to her lupus and RA.   She would like to have better glucose control with her prednisone      Depression Screen PHQ 2/9 Scores 08/24/2019 06/23/2019 06/23/2019 06/02/2019  PHQ - 2 Score 0 0 0 0  PHQ- 9 Score - 0 - -     Fall Risk Fall Risk  08/24/2019  Falls in the past year? 1  Number falls in past yr: 0  Injury with Fall? 0    Cognitive Function:     6CIT Screen 08/24/2019  What Year? 0 points  What month? 0 points  What time? 0 points  Count back from 20 0 points  Months in reverse 0 points  Repeat phrase 0 points  Total Score 0    Patient Care Team: [name], MD as PCP - General (Internal Medicine) Bensimhon, Shaune Pascal, MD as PCP - Cardiology (Cardiology) Evans Lance, MD as PCP - Electrophysiology (Cardiology) Lendon Colonel, NP as Nurse Practitioner (Nurse Practitioner) Lavera Guise, [facility] (Pharmacist)     Plan:   See above  I have personally reviewed and noted the following in the patient's chart:   . Medical and social history . Use of alcohol, tobacco or illicit drugs  . Current medications and supplements . Functional ability and status . Nutritional status . Physical activity . Advanced directives  . List of other physicians . Hospitalizations, surgeries, and ER visits in previous 12 months . Vitals . Screenings to include cognitive, depression, and falls . Referrals and appointments  In addition, I have reviewed and discussed with patient certain preventive protocols, quality metrics, and best practice recommendations. A written personalized care plan for preventive services as well as general preventive health recommendations were provided to patient.     [name], FNP 08/24/2019

## 2019-08-25 ENCOUNTER — Ambulatory Visit: Payer: Medicare Other | Admitting: Nurse Practitioner

## 2019-08-25 LAB — HIV ANTIBODY (ROUTINE TESTING W REFLEX): HIV Screen 4th Generation wRfx: NONREACTIVE

## 2019-08-25 MED ORDER — ONETOUCH VERIO W/DEVICE KIT: PACK | 1 refills | Status: DC

## 2019-09-01 ENCOUNTER — Ambulatory Visit (INDEPENDENT_AMBULATORY_CARE_PROVIDER_SITE_OTHER): Payer: Medicare Other | Admitting: *Deleted

## 2019-09-01 ENCOUNTER — Other Ambulatory Visit: Payer: Self-pay

## 2019-09-01 DIAGNOSIS — I420 Dilated cardiomyopathy: Secondary | ICD-10-CM

## 2019-09-01 DIAGNOSIS — I509 Heart failure, unspecified: Secondary | ICD-10-CM | POA: Diagnosis not present

## 2019-09-02 ENCOUNTER — Ambulatory Visit: Payer: Self-pay

## 2019-09-02 DIAGNOSIS — I10 Essential (primary) hypertension: Secondary | ICD-10-CM

## 2019-09-02 DIAGNOSIS — E1169 Type 2 diabetes mellitus with other specified complication: Secondary | ICD-10-CM

## 2019-09-02 LAB — CUP PACEART REMOTE DEVICE CHECK
Battery Remaining Longevity: 168 mo
Brady Statistic RV Percent Paced: 0 %
Date Time Interrogation Session: 20200910110252
HighPow Impedance: 70 Ohm
Implantable Lead Implant Date: 20190206
Implantable Lead Location: 753860
Implantable Lead Model: 292
Implantable Lead Serial Number: 438194
Implantable Pulse Generator Implant Date: 20190206
Lead Channel Impedance Value: 534 Ohm
Lead Channel Sensing Intrinsic Amplitude: 22.6 mV
Lead Channel Setting Pacing Amplitude: 2.5 V
Lead Channel Setting Pacing Pulse Width: 0.4 ms
Lead Channel Setting Sensing Sensitivity: 0.5 mV
Pulse Gen Serial Number: 243572

## 2019-09-02 NOTE — Chronic Care Management (AMB) (Signed)
  Care Management Note   Christine Mcdowell is a 48 y.o. year old female who is a primary care patient of [name], [place]. The CM team was consulted for assistance with care coordination.   I reached out to [name] by phone today to discuss recent referral place to our chronic care management program. Unfortunately, it was not a good time to talk. SW will outreach the patient again the morning of 09/03/2019.   Daneen Schick, BSW, CDP Social Worker, Certified Dementia Practitioner [place] [facility] [place] [facility] [phone]

## 2019-09-03 ENCOUNTER — Ambulatory Visit (INDEPENDENT_AMBULATORY_CARE_PROVIDER_SITE_OTHER): Payer: Medicare Other | Admitting: Pharmacist

## 2019-09-03 ENCOUNTER — Ambulatory Visit: Payer: Self-pay

## 2019-09-03 ENCOUNTER — Ambulatory Visit (INDEPENDENT_AMBULATORY_CARE_PROVIDER_SITE_OTHER): Payer: Medicare Other

## 2019-09-03 DIAGNOSIS — I10 Essential (primary) hypertension: Secondary | ICD-10-CM

## 2019-09-03 DIAGNOSIS — E1169 Type 2 diabetes mellitus with other specified complication: Secondary | ICD-10-CM

## 2019-09-03 DIAGNOSIS — I5022 Chronic systolic (congestive) heart failure: Secondary | ICD-10-CM | POA: Diagnosis not present

## 2019-09-03 DIAGNOSIS — Z95810 Presence of automatic (implantable) cardiac defibrillator: Secondary | ICD-10-CM

## 2019-09-03 NOTE — Chronic Care Management (AMB) (Signed)
Chronic Care Management   Initial Visit Note  09/03/2019 Name: Christine Mcdowell MRN: [id number] DOB: 08-Jul-1971  Referred by: [name], MD Reason for referral : Chronic Care Management (CCM RNCM Case Collaboration )   Christine Mcdowell is a 48 y.o. year old female who is a primary care patient of [name], MD. The care management team was consulted for assistance with chronic disease management and care coordination needs.   Review of patient status, including review of consultants reports, relevant laboratory and other test results, and collaboration with appropriate care team members and the patient's provider was performed as part of comprehensive patient evaluation and provision of chronic care management services.    I initiated and established the plan of care for Christine Mcdowell during one on one collaboration with my clinical care management colleague Daneen Schick BSW who is also engaged with this patient to address social work needs.   Outpatient Encounter Medications as of 09/03/2019  Medication Sig   Abatacept (ORENCIA) 125 MG/ML SOSY Inject into the skin once a week.   ACCU-CHEK FASTCLIX LANCETS MISC USE TO PRICK FINGER TO TEST BLOOD SUGAR BEFORE BREAKFAST AND DINNER   albuterol (ACCUNEB) 1.25 MG/3ML nebulizer solution Take 3 mLs (1.25 mg total) by nebulization every 6 (six) hours as needed for wheezing.   aspirin 81 MG EC tablet Take by mouth daily. Taking 2 tablets   blood glucose meter kit and supplies KIT Dispense based on patient and insurance preference. Use up to four times daily as directed. (FOR ICD-9 250.00, 250.01). FILL BASED ON INSURANCE PREFERENCE.   Blood Glucose Monitoring Suppl (ONETOUCH VERIO) w/Device KIT Check blood sugars twice daily E11.9   carvedilol (COREG) 25 MG tablet TAKE 1 TABLET(25 MG) BY MOUTH TWICE DAILY WITH A MEAL   Continuous Blood Gluc Receiver (DEXCOM G6 RECEIVER) DEVI 1 Device by Does not apply route 3 (three) times  daily before meals.   Continuous Blood Gluc Sensor (DEXCOM G6 SENSOR) MISC Inject 1 each into the skin 3 (three) times daily.   Continuous Blood Gluc Transmit (DEXCOM G6 TRANSMITTER) MISC 1 Device by Does not apply route 3 (three) times daily before meals.   diclofenac sodium (VOLTAREN) 1 % GEL Apply 1 g topically as needed.   folic acid (FOLVITE) 1 MG tablet TAKE 1 TABLET BY MOUTH EVERY MORNING (Patient taking differently: Take 1 mg by mouth daily. )   hydrALAZINE (APRESOLINE) 50 MG tablet Take 1 tablet (50 mg total) by mouth 3 (three) times daily.   hydroxychloroquine (PLAQUENIL) 200 MG tablet Take 1 tablet (200 mg total) by mouth 2 (two) times daily.   ipratropium (ATROVENT) 0.03 % nasal spray Place 2 sprays into the nose 3 (three) times daily as needed for rhinitis.   Magnesium 200 MG TABS Take 1 tablet by mouth daily.   meloxicam (MOBIC) 15 MG tablet Take 15 mg by mouth daily.    metFORMIN (GLUCOPHAGE) 500 MG tablet TAKE 1 TABLET BY MOUTH EVERY DAY WITH THE MORNING AND EVENING MEAL   methotrexate (RHEUMATREX) 2.5 MG tablet Take 20 mg by mouth every Friday.    metolazone (ZAROXOLYN) 2.5 MG tablet TAKE 1 TABLET BY MOUTH AS NEEDED FOR WEIGHT GAIN OF 5 POUNDS WITHIN 3 DAYS AS DIRECTED   mometasone (NASONEX) 50 MCG/ACT nasal spray Place 2 sprays into the nose daily.   montelukast (SINGULAIR) 10 MG tablet TAKE 1 TABLET BY MOUTH DAILY   Multiple Vitamin (MULTIVITAMIN WITH MINERALS) TABS Take 1 tablet by mouth every morning.  ondansetron (ZOFRAN) 4 MG tablet Take 1 tablet (4 mg total) by mouth every 8 (eight) hours as needed for nausea or vomiting.  °• predniSONE (DELTASONE) 20 MG tablet Take 40 mg by mouth daily with breakfast.   °• PROAIR HFA 108 (90 Base) MCG/ACT inhaler INHALE 2 PUFFS BY MOUTH EVERY DAY AS NEEDED FOR SHORTNESS OF BREATH  °• sacubitril-valsartan (ENTRESTO) 97-103 MG Take 1 tablet by mouth 2 (two) times daily.  °• spironolactone (ALDACTONE) 25 MG tablet Take 1 tablet  (25 mg total) by mouth every evening.  °• torsemide (DEMADEX) 20 MG tablet Take 1 tablet Monday, Wednesday , and Friday  °• traMADol (ULTRAM) 50 MG tablet Take 1 tablet (50 mg total) by mouth every 6 (six) hours as needed.  °• traZODone (DESYREL) 50 MG tablet Take 50 mg by mouth at bedtime.  °• TRESIBA FLEXTOUCH 100 UNIT/ML SOPN FlexTouch Pen INJECT 12 UNIT INTO THE SKIN EVERY NIGHT AT BEDTIME PER INSULIN PROTOCOL (Patient taking differently: Inject 12 Units into the skin. )  °• Vitamin D, Ergocalciferol, (DRISDOL) 1.25 MG (50000 UT) CAPS capsule Take 1 capsule (50,000 Units total) by mouth 2 (two) times a week. Every Tuesday and Friday  ° °No facility-administered encounter medications on file as of 09/03/2019.   °  ° °Goals Addressed   ° • Assist with Chronic Care Managment and Care Coordination needs     °  Current Barriers:  °• Knowledge Barriers related to resources and support available to address needs related to Chronic Care Management and Care Coordination ° °Case Manager Clinical Goal(s):  °• Over the next 30 days, patient will work with CCM team to address needs related to chronic disease management and Care Coordination ° °Interventions:  °• Collaborated with BSW and initiated plan of care to address needs related to Chronic disease management and Care Coordination needs ° °Patient Self Care Activities:  °• Self administers medications as prescribed °• Attends all scheduled provider appointments °• Calls pharmacy for medication refills °• Performs ADL's independently °• Performs IADL's independently °• Calls provider office for new concerns or questions ° °Initial goal documentation ° °  °  °  ° °Telephone follow up appointment with care management team member scheduled for: 10/01/19 ° ° , RN, BSN, CCM °Care Management Coordinator °[facility]  °Direct Phone: [phone]  ° ° ° °

## 2019-09-03 NOTE — Patient Instructions (Signed)
Social Worker Visit Information  Goals we discussed today:  Goals Addressed            This Visit's Progress     Patient Stated   . "I need help getting a new glucometer scanner" (pt-stated)       Current Barriers:  . Issues replacing receiver due to insurance guidelines . Financial limitations to cover out of pocket costs  Clinical Social Work Clinical Goal(s):  Marland Kitchen Over the next 45 days the patient will work with embedded PharmD to address glucose monitoring system needs.  CCM SW Interventions: Completed 09/03/2019 . Initial outreach to the patient, proper assessment performs . Determined the patient needs assistance in obtaining a new receiver for her DexCom G6. The patient obtained this monitoring system less than one year ago. Unfortunately, approximately two months ago the receiver was thrown away . "My [employer] says it is too soon to replace and Medicare says it is considered DME" . Assessed for patients ability to check blood sugar readings at this time . 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 Advised the patient she would be contacted by embedded PharmD to further explore glucose monitoring options  Patient Self Care Activities:  . Self administers medications as prescribed . Calls pharmacy for medication refills . Calls provider office for new concerns or questions  Initial goal documentation     . "I need somewhere to stay" (pt-stated)       Current Barriers:  . Financial constraints related to long wait period for disability approval  Clinical Social Work Clinical Goal(s):  Marland Kitchen Over the next 45 days the patient will become more knowledgeable of low income housing options within [place]  Interventions: . Outbound call to the patient to assess SW needs . Determined the patient is currently staying with her children due to waiting 2.5 years for disability approval "I lost everything waiting" . Confirmed the patient has been approved for disability income effective February 2020 . Reviewed options for low income housing with the patient including section 8, public housing, and low income apartments . Determined the patient has already applied for both Section 8 and [employer]. The patient reports she was unable to complete the Section 8 application prior to the window closing but she was successful in completing the [employer] application . Encouraged the patient to stay in contact with the housing authority regarding Section 8 to allow her to apply for this program once the application window opens . Mailed the patient a list of low income apartments within [facility] for her review. The patient is aware these options have waiting lists . Scheduled a follow up call to the patient over the next four week to assess progression of goal   Patient Self Care Activities:  . Self administers medications as prescribed . Calls pharmacy for medication refills . Calls provider office for new concerns or questions  Initial goal documentation     . "I want resources for transportation" (pt-stated)       Current Barriers:  . Financial insecurities . Limited knowledge of community resources to assist with transportation  Clinical Social Work Clinical Goal(s):  Marland Kitchen Over the next 45 days, client will follow up with [facility] as  directed by SW  Interventions: . Patient interviewed and appropriate assessments performed . Provided patient with information about ADTS transportation service within [facility] . Advised the patient that unfortunately, this program did not cross county lines . Placed patient referral to ADTS via [id number] . Scheduled follow up call to the  patient over the next four weeks to review progression of patient stated goal  Patient Self Care Activities:  . Currently UNABLE TO independently provide own transportation  Initial goal documentation     . "My food stamps got cut when my disability was approved" (pt-stated)       Current Barriers:  . Reduction in benefits due to change in income . Lacks knowledge of local food pantry resources  Clinical Social Work Clinical Goal(s):  Marland Kitchen Over the next 30 days the patient will become more knowledgeable of local food and nutrition resources   Interventions: . Patient interviewed and appropriate assessments performed . Determined the patient has recently experienced a reduction in food and nutrition benefits and currently receives $16/month due to her disability income . Assessed for patients ability to afford food the entire month, the patient reports she is unable to cover the cost of her nutritional needs but states her family helps cover the cost . Educated the patient on the option to access local food pantry sites in order to supplement her nutritional needs . Determined the patient is agreeable to receiving information on these resources . Mailed the patient a Pension scheme manager for [facility] . Scheduled a follow up call to confirm receipt of mailed resource  Patient Self Care Activities:  . Self administers medications as prescribed . Calls provider office for new concerns or questions  Initial goal documentation       Other   . COMPLETED: Assist with enrollment into the Chronic Care Management Program and perform an SDOH ( Social Determinants of Health) Screening       Current Barriers:  Marland Kitchen Knowledge Barriers related to resources and support available to address needs related to Chronic Care Management and challenges surrounding Social Determinants of Health  Clinical Social Work Clinical Goal(s):   Over the next 20 days, the patient will understand the role of the  CCM team and work with SW to complete SDOH (Social Determinants of Health) screen.  Interventions:  Outbound call placed to the patient to introduce the Chronic Care Management program  Patient Education provided regarding referral placed by [name] FNP and obtained verbal consent for enrollment  Patient screened for SDOH (Social Determinants of Health)  Identified challenges with:Housing, Transportation, Food Insecurities, DME Equipment  Provided education about SW role and plan to address identified SDOH challenge(s)  Collaboration with CCM team to communicate patient's enrollment into the program  Advised the patient to expect a call from [place] and embedded PharmD in the coming weeks  Patient Self Care Activities:   Patient currently unable to independently manage chronic conditions   Patient currently unable to identify community resources to assist with SDOH challenges  Initial goal documentation: See individual goal documentation for details related to specific interventions         Materials provided: Yes: [employer]ity resources provided via mail.  Ms. Allbright was given information about Chronic Care Management services today including:  1. CCM service includes personalized support from designated clinical staff supervised by her physician, including individualized plan of care and coordination with other care providers 2. 24/7 contact phone numbers for assistance for urgent and  routine care needs. 3. Service will only be billed when office clinical staff spend 20 minutes or more in a month to coordinate care. 4. Only one practitioner may furnish and bill the service in a calendar month. 5. The patient may stop CCM services at any time (effective at the end of the month) by phone call to the office staff. 6. The patient will be responsible for cost sharing (co-pay) of up to 20% of the service fee (after annual deductible is met).  Patient agreed to  services and verbal consent obtained.   The patient verbalized understanding of instructions provided today and declined a print copy of patient instruction materials.   Follow up plan: SW will follow up with patient by phone over the next month.   Daneen Schick, BSW, CDP Social Worker, Certified Dementia Practitioner [facility] [phone]

## 2019-09-03 NOTE — Chronic Care Management (AMB) (Signed)
Chronic Care Management    Social Work General Note  09/03/2019 Name: Christine Mcdowell MRN: [id number] DOB: [birth year]  Christine Mcdowell is a 48 y.o. year old female who is a primary care patient of [name], MD. The CCM was consulted to assist the patient with [employer] .   Ms. Karnes was given information about Chronic Care Management services today including:  1. CCM service includes personalized support from designated clinical staff supervised by her physician, including individualized plan of care and coordination with other care providers 2. 24/7 contact phone numbers for assistance for urgent and routine care needs. 3. Service will only be billed when office clinical staff spend 20 minutes or more in a month to coordinate care. 4. Only one practitioner may furnish and bill the service in a calendar month. 5. The patient may stop CCM services at any time (effective at the end of the month) by phone call to the office staff. 6. The patient will be responsible for cost sharing (co-pay) of up to 20% of the service fee (after annual deductible is met).  Patient agreed to services and verbal consent obtained.   Review of patient status, including review of consultants reports, relevant laboratory and other test results, and collaboration with appropriate care team members and the patient's provider was performed as part of comprehensive patient evaluation and provision of chronic care management services.    SDOH (Social Determinants of Health) screening performed today. See Care Plan Entry related to challenges with: [employer] Insecurity   Outpatient Encounter Medications as of 09/03/2019  Medication Sig  . Abatacept (ORENCIA) 125 MG/ML SOSY Inject into the skin once a week.  Marland Kitchen ACCU-CHEK FASTCLIX LANCETS MISC USE TO PRICK FINGER TO TEST BLOOD SUGAR BEFORE BREAKFAST AND DINNER  . albuterol (ACCUNEB) 1.25 MG/3ML nebulizer solution Take 3 mLs (1.25  mg total) by nebulization every 6 (six) hours as needed for wheezing.  Marland Kitchen aspirin 81 MG EC tablet Take by mouth daily. Taking 2 tablets  . blood glucose meter kit and supplies KIT Dispense based on patient and insurance preference. Use up to four times daily as directed. (FOR ICD-9 250.00, 250.01). FILL BASED ON INSURANCE PREFERENCE.  Marland Kitchen Blood Glucose Monitoring Suppl (ONETOUCH VERIO) w/Device KIT Check blood sugars twice daily E11.9  . carvedilol (COREG) 25 MG tablet TAKE 1 TABLET(25 MG) BY MOUTH TWICE DAILY WITH A MEAL  . Continuous Blood Gluc Receiver (DEXCOM G6 RECEIVER) DEVI 1 Device by Does not apply route 3 (three) times daily before meals.  . Continuous Blood Gluc Sensor (DEXCOM G6 SENSOR) MISC Inject 1 each into the skin 3 (three) times daily.  . Continuous Blood Gluc Transmit (DEXCOM G6 TRANSMITTER) MISC 1 Device by Does not apply route 3 (three) times daily before meals.  . diclofenac sodium (VOLTAREN) 1 % GEL Apply 1 g topically as needed.  . folic acid (FOLVITE) 1 MG tablet TAKE 1 TABLET BY MOUTH EVERY MORNING (Patient taking differently: Take 1 mg by mouth daily. )  . hydrALAZINE (APRESOLINE) 50 MG tablet Take 1 tablet (50 mg total) by mouth 3 (three) times daily.  . hydroxychloroquine (PLAQUENIL) 200 MG tablet Take 1 tablet (200 mg total) by mouth 2 (two) times daily.  Marland Kitchen ipratropium (ATROVENT) 0.03 % nasal spray Place 2 sprays into the nose 3 (three) times daily as needed for rhinitis.  . Magnesium 200 MG TABS Take 1 tablet by mouth daily.  . meloxicam (MOBIC) 15 MG tablet Take 15 mg by mouth  daily.   . metFORMIN (GLUCOPHAGE) 500 MG tablet TAKE 1 TABLET BY MOUTH EVERY DAY WITH THE MORNING AND EVENING MEAL  . methotrexate (RHEUMATREX) 2.5 MG tablet Take 20 mg by mouth every Friday.   . metolazone (ZAROXOLYN) 2.5 MG tablet TAKE 1 TABLET BY MOUTH AS NEEDED FOR WEIGHT GAIN OF 5 POUNDS WITHIN 3 DAYS AS DIRECTED  . mometasone (NASONEX) 50 MCG/ACT nasal spray Place 2 sprays into the nose  daily.  . montelukast (SINGULAIR) 10 MG tablet TAKE 1 TABLET BY MOUTH DAILY  . Multiple Vitamin (MULTIVITAMIN WITH MINERALS) TABS Take 1 tablet by mouth every morning.   . ondansetron (ZOFRAN) 4 MG tablet Take 1 tablet (4 mg total) by mouth every 8 (eight) hours as needed for nausea or vomiting.  . predniSONE (DELTASONE) 20 MG tablet Take 40 mg by mouth daily with breakfast.   . PROAIR HFA 108 (90 Base) MCG/ACT inhaler INHALE 2 PUFFS BY MOUTH EVERY DAY AS NEEDED FOR SHORTNESS OF BREATH  . sacubitril-valsartan (ENTRESTO) 97-103 MG Take 1 tablet by mouth 2 (two) times daily.  Marland Kitchen spironolactone (ALDACTONE) 25 MG tablet Take 1 tablet (25 mg total) by mouth every evening.  . torsemide (DEMADEX) 20 MG tablet Take 1 tablet Monday, Wednesday , and Friday  . traMADol (ULTRAM) 50 MG tablet Take 1 tablet (50 mg total) by mouth every 6 (six) hours as needed.  . traZODone (DESYREL) 50 MG tablet Take 50 mg by mouth at bedtime.  . TRESIBA FLEXTOUCH 100 UNIT/ML SOPN FlexTouch Pen INJECT 12 UNIT INTO THE SKIN EVERY NIGHT AT BEDTIME PER INSULIN PROTOCOL (Patient taking differently: Inject 12 Units into the skin. )  . Vitamin D, Ergocalciferol, (DRISDOL) 1.25 MG (50000 UT) CAPS capsule Take 1 capsule (50,000 Units total) by mouth 2 (two) times a week. Every Tuesday and Friday   No facility-administered encounter medications on file as of 09/03/2019.     Goals Addressed            This Visit's Progress     Patient Stated   . "I need help getting a new glucometer scanner" (pt-stated)       Current Barriers:  . Issues replacing receiver due to insurance guidelines . Financial limitations to cover out of pocket costs  Clinical Social Work Clinical Goal(s):  Marland Kitchen Over the next 45 days the patient will work with embedded PharmD to address glucose monitoring system needs.  CCM SW Interventions: Completed 09/03/2019 . Initial outreach to the patient, proper assessment performs . Determined the patient needs  assistance in obtaining a new receiver for her DexCom G6. The patient obtained this monitoring system less than one year ago. Unfortunately, approximately two months ago the receiver was thrown away . "My BCBS says it is too soon to replace and Medicare says it is considered DME" . Assessed for patients ability to check blood sugar readings at this time . 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 Advised the patient she would be contacted by embedded PharmD to further explore glucose monitoring options  Patient Self Care Activities:  . Self administers medications as prescribed . Calls pharmacy for medication refills . Calls provider office for new concerns or questions  Initial goal documentation     . "  I need somewhere to stay" (pt-stated)       Current Barriers:  . Financial constraints related to long wait period for disability approval  Clinical Social Work Clinical Goal(s):  Marland Kitchen Over the next 45 days the patient will become more knowledgeable of low income housing options within [place]  Interventions: . Outbound call to the patient to assess SW needs . Determined the patient is currently staying with her children due to waiting 2.5 years for disability approval "I lost everything waiting" . Confirmed the patient has been approved for disability income effective February 2020 . Reviewed options for low income housing with the patient including section 8, public housing, and low income apartments . Determined the patient has already applied for both Section 8 and [employer]. The patient reports she was unable to complete the Section 8 application prior to the window closing but she was successful in  completing the [employer] application . Encouraged the patient to stay in contact with the housing authority regarding Section 8 to allow her to apply for this program once the application window opens . Mailed the patient a list of low income apartments within [facility] for her review. The patient is aware these options have waiting lists . Scheduled a follow up call to the patient over the next four week to assess progression of goal   Patient Self Care Activities:  . Self administers medications as prescribed . Calls pharmacy for medication refills . Calls provider office for new concerns or questions  Initial goal documentation     . "I want resources for transportation" (pt-stated)       Current Barriers:  . Financial insecurities . Limited knowledge of community resources to assist with transportation  Clinical Social Work Clinical Goal(s):  Marland Kitchen Over the next 45 days, client will follow up with [facility] as directed by SW  Interventions: . Patient interviewed and appropriate assessments performed . Provided patient with information about AD[facility] transportation service within [facility] . Advised the patient that unfortunately, this program did not cross county lines . Placed patient referral to ADTS via [id number] . Scheduled follow up call to the patient over the next four weeks to review progression of patient stated goal  Patient Self Care Activities:  . Currently UNABLE TO independently provide own transportation  Initial goal documentation     . "My food stamps got cut when my disability was approved" (pt-stated)       Current Barriers:  . Reduction in benefits due to change in income . Lacks knowledge of local food pantry resources  Clinical Social Work Clinical Goal(s):  Marland Kitchen Over the next 30 days the patient will become more knowledgeable of local food and nutrition resources   Interventions: . Patient interviewed and appropriate assessments performed .  Determined the patient has recently experienced a reduction in food and nutrition benefits and currently receives $16/month due to her disability income . Assessed for patients ability to afford food the entire month, the patient reports she is unable to cover the cost of her nutritional needs but states her family helps cover the cost . Educated the patient on the option to access local food pantry sites in order to supplement her nutritional needs . Determined the patient is agreeable to receiving information on these resources . Mailed the patient a Pension scheme manager for [facility] . Scheduled a follow up call to confirm receipt of mailed resource  Patient Self Care Activities:  . Self administers medications  as prescribed . Calls provider office for new concerns or questions  Initial goal documentation       Other   . COMPLETED: Assist with enrollment into the Chronic Care Management Program and perform an SDOH ( Social Determinants of Health) Screening       Current Barriers:  Marland Kitchen Knowledge Barriers related to resources and support available to address needs related to Chronic Care Management and challenges surrounding Social Determinants of Health  Clinical Social Work Clinical Goal(s):   Over the next 20 days, the patient will understand the role of the CCM team and work with SW to complete SDOH (Social Determinants of Health) screen.  Interventions:  Outbound call placed to the patient to introduce the Chronic Care Management program  Patient Education provided regarding referral placed by [name] FNP and obtained verbal consent for enrollment  Patient screened for SDOH (Social Determinants of Health)  Identified challenges with:Housing, Transportation, Food Insecurities, DME Equipment  Provided education about SW role and plan to address identified SDOH challenge(s)  Collaboration with CCM team to communicate patient's enrollment into the program  Advised the  patient to expect a call from Vivian and embedded PharmD in the coming weeks  Patient Self Care Activities:   Patient currently unable to independently manage chronic conditions   Patient currently unable to identify community resources to assist with SDOH challenges  Initial goal documentation: See individual goal documentation for details related to specific interventions         Follow Up Plan: SW will follow up with patient by phone over the next four weeks.       Daneen Schick, BSW, CDP Social Worker, Certified Dementia Practitioner [place] [facility] [place] [facility] [phone]  Total time spent performing care coordination and/or care management activities with the patient by phone or face to face = 30 minutes.

## 2019-09-03 NOTE — Progress Notes (Signed)
EPIC Encounter for ICM Monitoring  Patient Name: Christine Mcdowell is a 48 y.o. female Date: 09/03/2019 Primary Care Physican: [name], MD Primary Cardiologist:Bensimhon Electrophysiologist:Taylor Last Weight:184lbs  Spoke with patient.  She said she is feeling better since ED visit. She said her BP is better but it mainly drops at night time.   09/01/2019 [employer] Heart Failure Index0 which is within normal threshold.Per Epic notes pt seen in ED 8/22 for weakness, low BP and dyspnea.  Per 8/24 note patient having difficulty taking Torsemide due to she feels it causes low BP.   Prescribed:Torsemide20 mg take1tablet (20 mg total) Mon, Wed and Friday.Metolazone 2.5 mg takeone tablet as needed for weight gain of 5 lbs within 3 days.   Labs: 06/18/2019 Creatinine 0.85, BUN 9, Potassium 3.4, Sodium 143, GFR >60 03/29/2019 Creatinine 1.15, BUN 14, Potassium 5.2, Sodium 137, GFR 56-64 02/16/2019 Creatinine 0.98, BUN 10, Potassium 3.6, Sodium 142, GFR >60  Recommendations:No changes and encouraged to call if experiencing any fluid symptoms.  Follow-up plan: ICM clinic phone appointment on 10/18/2019.  Office appointment scheduled 11/10/2019 with Dr. Haroldine Laws.        Copy of ICM check sent to Dr. Lovena Le.     [place], RN 09/03/2019 8:14 AM

## 2019-09-06 ENCOUNTER — Encounter: Payer: Self-pay | Admitting: Nurse Practitioner

## 2019-09-07 ENCOUNTER — Telehealth: Payer: Self-pay

## 2019-09-07 ENCOUNTER — Encounter: Payer: Self-pay | Admitting: Nurse Practitioner

## 2019-09-07 ENCOUNTER — Other Ambulatory Visit: Payer: Self-pay

## 2019-09-07 ENCOUNTER — Telehealth (INDEPENDENT_AMBULATORY_CARE_PROVIDER_SITE_OTHER): Payer: Medicare Other | Admitting: Nurse Practitioner

## 2019-09-07 VITALS — BP 136/83 | HR 89 | Ht 66.0 in | Wt 191.0 lb

## 2019-09-07 DIAGNOSIS — R1030 Lower abdominal pain, unspecified: Secondary | ICD-10-CM | POA: Diagnosis not present

## 2019-09-07 DIAGNOSIS — R197 Diarrhea, unspecified: Secondary | ICD-10-CM

## 2019-09-07 MED ORDER — DICYCLOMINE HCL 20 MG PO TABS: 20.0000 mg | ORAL_TABLET | Freq: Three times a day (TID) | ORAL | 0 refills | Status: DC | PRN

## 2019-09-07 MED ORDER — PROBIOTIC 1-250 BILLION-MG PO CAPS: 1.0000 | ORAL_CAPSULE | Freq: Every day | ORAL | 2 refills | Status: DC

## 2019-09-07 NOTE — Telephone Encounter (Signed)
Pharmacy called stating she needed to know what brand probiotic is the patient suppose to take.  I HAVE RETURNED THEIR CALL AND ADVISED THEM TO PROVIDE PATIENT WITH WHICHEVER PROBIOTIC IS COMMON PER [name] FNP-BC. YRL,RMA

## 2019-09-07 NOTE — Patient Instructions (Addendum)
Visit Information  Goals Addressed            This Visit's Progress     Patient Stated   . "I need help getting a new glucometer scanner" (pt-stated)       Current Barriers:  . Issues replacing receiver due to insurance guidelines . Financial limitations to cover out of pocket costs  Clinical Social Work Clinical Goal(s):  Marland Kitchen Over the next 45 days the patient will work with embedded PharmD to address glucose monitoring system needs.  CCM SW Interventions: Completed 09/03/2019 . Initial outreach to the patient, proper assessment performs . Determined the patient needs assistance in obtaining a new receiver for her DexCom G6. The patient obtained this monitoring system less than one year ago. Unfortunately, approximately two months ago the receiver was thrown away . "My BCBS says it is too soon to replace and Medicare says it is considered DME" . Assessed for patients ability to check blood sugar readings at this time . 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 Advised the patient she would be contacted by embedded PharmD to further explore glucose monitoring options  Patient Self Care Activities:  . Self administers medications as prescribed . Calls pharmacy for medication refills . Calls provider office for new concerns or questions  Initial goal documentation     . I need help getting a replacement Dexcom scanner glucometer (pt-stated)       Current Barriers:  . Issues replacing receiver due to insurance guidelines . Financial limitations to cover out of pocket costs  CCM PharmD Clinical Goal(s):  Marland Kitchen Over the next 45 days the patient will work with PCP and PharmD to address glucose monitoring system  needs.  CCM PharmDInterventions: Completed 09/03/2019 . Comprehensive medication review performed.  Updated medication list in the EMR. . Determined the patient needs assistance in obtaining a new receiver for her DexCom G6. The patient obtained this monitoring system less than one year ago. Unfortunately, approximately two months ago the receiver was thrown away . "My BCBS says it is too soon to replace and Medicare says it is considered DME".  Called [employer] and requested a "lost override" for Dexcom scanner device.  This was denied.  Only 1 scanner device is allowed per 365 calendar days. . Assessed for patients ability to check blood sugar readings at this time . 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  Will discuss with PCP about finger checks.  I'm unsure of the need to check blood sugar 1-12 times per day unless patient was to be put of sliding scale.  She is checking her blood sugar this often, however no action is being taken.  She is on prednisone 40mg  daily and may need sliding scale based on her readings.  Will explore Freestyle Libre possibilities as able. . Will have Accucheck Aviva called in as a back up glucometer.  One Touch Verio is backordered at her current pharmacy  Patient Self Care Activities:  . Self  administers medications as prescribed . Calls pharmacy for medication refills . Calls provider office for new concerns or questions  Initial goal documentation        The patient verbalized understanding of instructions provided today and declined a print copy of patient instruction materials.   The care management team will reach out to the patient again over the next 2 weeks.  Regina Eck, PharmD,  BCPS Clinical Pharmacist, [facility] [place]: [phone]

## 2019-09-07 NOTE — Progress Notes (Signed)
Chronic Care Management   Initial Visit Note  09/03/2019 Name: Christine Mcdowell MRN: [id number] DOB: 05-18-1971  Referred by: [name], MD Reason for referral : Chronic Care Management   Christine Mcdowell is a 48 y.o. year old female who is a primary care patient of [name], MD. The CCM team was consulted for assistance with chronic disease management and care coordination needs.   Review of patient status, including review of consultants reports, relevant laboratory and other test results, and collaboration with appropriate care team members and the patient's provider was performed as part of comprehensive patient evaluation and provision of chronic care management services.    I spoke with Christine Mcdowell by telephone today.  Advanced Directives Status: N See Care Plan and Vynca application for related entries.   Medications: Outpatient Encounter Medications as of 09/03/2019  Medication Sig  . Abatacept (ORENCIA) 125 MG/ML SOSY Inject into the skin once a week.  Marland Kitchen ACCU-CHEK FASTCLIX LANCETS MISC USE TO PRICK FINGER TO TEST BLOOD SUGAR BEFORE BREAKFAST AND DINNER  . albuterol (ACCUNEB) 1.25 MG/3ML nebulizer solution Take 3 mLs (1.25 mg total) by nebulization every 6 (six) hours as needed for wheezing.  Marland Kitchen aspirin 81 MG EC tablet Take by mouth daily. Taking 2 tablets  . blood glucose meter kit and supplies KIT Dispense based on patient and insurance preference. Use up to four times daily as directed. (FOR ICD-9 250.00, 250.01). FILL BASED ON INSURANCE PREFERENCE.  Marland Kitchen Blood Glucose Monitoring Suppl (ONETOUCH VERIO) w/Device KIT Check blood sugars twice daily E11.9  . carvedilol (COREG) 25 MG tablet TAKE 1 TABLET(25 MG) BY MOUTH TWICE DAILY WITH A MEAL  . Continuous Blood Gluc Receiver (DEXCOM G6 RECEIVER) DEVI 1 Device by Does not apply route 3 (three) times daily before meals.  . Continuous Blood Gluc Sensor (DEXCOM G6 SENSOR) MISC Inject 1 each into the skin 3 (three) times  daily.  . Continuous Blood Gluc Transmit (DEXCOM G6 TRANSMITTER) MISC 1 Device by Does not apply route 3 (three) times daily before meals.  . diclofenac sodium (VOLTAREN) 1 % GEL Apply 1 g topically as needed.  . folic acid (FOLVITE) 1 MG tablet TAKE 1 TABLET BY MOUTH EVERY MORNING (Patient taking differently: Take 1 mg by mouth daily. )  . hydrALAZINE (APRESOLINE) 50 MG tablet Take 1 tablet (50 mg total) by mouth 3 (three) times daily.  . hydroxychloroquine (PLAQUENIL) 200 MG tablet Take 1 tablet (200 mg total) by mouth 2 (two) times daily.  Marland Kitchen ipratropium (ATROVENT) 0.03 % nasal spray Place 2 sprays into the nose 3 (three) times daily as needed for rhinitis.  . Magnesium 200 MG TABS Take 1 tablet by mouth daily.  . meloxicam (MOBIC) 15 MG tablet Take 15 mg by mouth daily.   . metFORMIN (GLUCOPHAGE) 500 MG tablet TAKE 1 TABLET BY MOUTH EVERY DAY WITH THE MORNING AND EVENING MEAL  . methotrexate (RHEUMATREX) 2.5 MG tablet Take 20 mg by mouth every Friday.   . metolazone (ZAROXOLYN) 2.5 MG tablet TAKE 1 TABLET BY MOUTH AS NEEDED FOR WEIGHT GAIN OF 5 POUNDS WITHIN 3 DAYS AS DIRECTED  . mometasone (NASONEX) 50 MCG/ACT nasal spray Place 2 sprays into the nose daily.  . montelukast (SINGULAIR) 10 MG tablet TAKE 1 TABLET BY MOUTH DAILY  . Multiple Vitamin (MULTIVITAMIN WITH MINERALS) TABS Take 1 tablet by mouth every morning.   . ondansetron (ZOFRAN) 4 MG tablet Take 1 tablet (4 mg total) by mouth every 8 (eight) hours as needed  for nausea or vomiting.  . predniSONE (DELTASONE) 20 MG tablet Take 40 mg by mouth daily with breakfast.   . PROAIR HFA 108 (90 Base) MCG/ACT inhaler INHALE 2 PUFFS BY MOUTH EVERY DAY AS NEEDED FOR SHORTNESS OF BREATH  . sacubitril-valsartan (ENTRESTO) 97-103 MG Take 1 tablet by mouth 2 (two) times daily.  Marland Kitchen spironolactone (ALDACTONE) 25 MG tablet Take 1 tablet (25 mg total) by mouth every evening.  . torsemide (DEMADEX) 20 MG tablet Take 1 tablet Monday, Wednesday , and Friday   . traMADol (ULTRAM) 50 MG tablet Take 1 tablet (50 mg total) by mouth every 6 (six) hours as needed.  . traZODone (DESYREL) 50 MG tablet Take 50 mg by mouth at bedtime.  . TRESIBA FLEXTOUCH 100 UNIT/ML SOPN FlexTouch Pen INJECT 12 UNIT INTO THE SKIN EVERY NIGHT AT BEDTIME PER INSULIN PROTOCOL (Patient taking differently: Inject 12 Units into the skin. )  . Vitamin D, Ergocalciferol, (DRISDOL) 1.25 MG (50000 UT) CAPS capsule Take 1 capsule (50,000 Units total) by mouth 2 (two) times a week. Every Tuesday and Friday  . [DISCONTINUED] amoxicillin (AMOXIL) 500 MG capsule Take 500 mg by mouth daily. Take 1 tablet daily for 4 days   No facility-administered encounter medications on file as of 09/03/2019.      Objective:   Goals Addressed            This Visit's Progress     Patient Stated   . "I need help getting a new glucometer scanner" (pt-stated)       Current Barriers:  . Issues replacing receiver due to insurance guidelines . Financial limitations to cover out of pocket costs  Clinical Social Work Clinical Goal(s):  Marland Kitchen Over the next 45 days the patient will work with embedded PharmD to address glucose monitoring system needs.  CCM SW Interventions: Completed 09/03/2019 . Initial outreach to the patient, proper assessment performs . Determined the patient needs assistance in obtaining a new receiver for her DexCom G6. The patient obtained this monitoring system less than one year ago. Unfortunately, approximately two months ago the receiver was thrown away . "My [employer] says it is too soon to replace and Medicare says it is considered DME" . Assessed for patients ability to check blood sugar readings at this time . 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 Advised the patient she would be contacted by embedded PharmD to further explore glucose monitoring options  Patient Self Care Activities:  . Self administers medications as prescribed . Calls pharmacy for medication refills . Calls provider office for new concerns or questions  Initial goal documentation     . I need help getting a replacement Dexcom scanner glucometer (pt-stated)       Current Barriers:  . Issues replacing receiver due to insurance guidelines . Financial limitations to cover out of pocket costs  CCM PharmD Clinical Goal(s):  Marland Kitchen Over the next 45 days the patient will work with PCP and PharmD to address glucose monitoring system needs.  CCM PharmD Interventions: Completed 09/03/2019 . Comprehensive medication review performed.  Updated medication list in the EMR. . Determined the patient needs assistance in obtaining a new receiver for her DexCom G6. The patient obtained this monitoring system  less than one year ago. Unfortunately, approximately two months ago the receiver was thrown away . "My BCBS says it is too soon to replace and Medicare says it is considered DME".  Called [employer] and requested a "lost override" for Dexcom scanner device.  This was denied.  Only 1 scanner device is allowed per 365 calendar days. . Assessed for patients ability to check blood sugar readings at this time . 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  Will discuss with PCP about finger checks.  I'm unsure of the need to check blood sugar 1-12 times per day unless patient was to be put of sliding scale.  She is checking her blood sugar this often, however no action is being taken.  She is on  prednisone 62m daily and may need sliding scale based on her readings.  Will explore Free [employer] as well . Will have Accucheck Aviva called in as a back up glucometer.  One Touch Verio is backordered at her current pharmacy  Patient Self Care Activities:  . Self administers medications as prescribed . Calls pharmacy for medication refills . Calls provider office for new concerns or questions  Initial goal documentation         Plan:   The care management team will reach out to the patient again over the next 2 weeks.  JRegina Eck PharmD, BCPS Clinical Pharmacist, [facility] [place] [phone]

## 2019-09-07 NOTE — Progress Notes (Signed)
Virtual Visit via Video   This visit type was conducted due to national recommendations for restrictions regarding the COVID-19 Pandemic (e.g. social distancing) in an effort to limit this patient's exposure and mitigate transmission in our community.  Due to her co-morbid illnesses, this patient is at least at moderate risk for complications without adequate follow up.  This format is felt to be most appropriate for this patient at this time.  All issues noted in this document were discussed and addressed.  A limited physical exam was performed with this format.    This visit type was conducted due to national recommendations for restrictions regarding the COVID-19 Pandemic (e.g. social distancing) in an effort to limit this patient's exposure and mitigate transmission in our community.  Patients identity confirmed using two different identifiers.  This format is felt to be most appropriate for this patient at this time.  All issues noted in this document were discussed and addressed.  No physical exam was performed (except for noted visual exam findings with Video Visits).    Date:  09/19/2019   ID:  Christine Mcdowell, DOB Nov 07, 1971, MRN [id number]  Patient Location:  Home - spoke with Christine Mcdowell while she was outside  Provider location:   Equ[employer] Complaint:  Lower abdominal pain and diarrhea  History of Present Illness:    Christine Mcdowell is a 48 y.o. female who presents via video conferencing for a telehealth visit today.    The patient does not have symptoms concerning for COVID-19 infection (fever, chills, cough, or new shortness of breath).   HPI  Virtual visit done today due to low abdominal pain for 3 days with diarrhea.  Last time she vomiting was last night.  She had relief to her abdominal pain. Had been to the beach and eating out more.  Denies fever. Now feeling a little better.  Still has pain but not as bad as when first started. Constant sharp pain.   She is not drinking as much as usual due to the congestive heart failure.    She has had a total hysterectomy.    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   Discoid lupus    Fibromyalgia    History of blood transfusion "several"   "related to anemia; had some w/hysterectomy also"   Hx of cardiovascular stress test    ETT-Myoview (9/15):  No ischemia, EF 52%; NORMAL   Hx of echocardiogram    Echo (9/15):  EF 50-55%, ant HK, Gr 1 DD, mild MR, mild LAE, no effusion   Hypertension    Iron deficiency anemia    h/o iron transfusions   Lupus (systemic lupus erythematosus) ([place])    Migraine    "a few/year" (07/03/2016)   Pneumonia 12/2015   RA (rheumatoid arthritis) ([place])    "all over" (07/03/2016)   Sickle cell trait ([place])    Stroke ([place]) 2014 X 1; 2015 X 2; 2016 X 1;    "right side of face more relaxed than the other; rare speech hesitation" (07/03/2016)   Vaginal Pap smear, abnormal    ASCUS; HPV   Past Surgical History:  Procedure Laterality Date   ABDOMINAL HYSTERECTOMY  2009   ABDOMINAL WOUND  DEHISCENCE  2009   CARDIAC CATHETERIZATION N/A 10/11/2016   Procedure: Right/Left Heart Cath and Coronary Angiography;  Surgeon: Jolaine Artist, MD;  Location: [facility];  Service: Cardiovascular;  Laterality: N/A;   DILATION AND CURETTAGE OF UTERUS  1991   HEMATOMA EVACUATION  2009   abdomen   ICD IMPLANT  01/28/2018   ICD IMPLANT N/A 01/28/2018   Procedure: ICD IMPLANT;  Surgeon: Evans Lance, MD;  Location: [facility];  Service: Cardiovascular;  Laterality: N/A;   INCISE AND DRAIN ABCESS  2009 X 2   "abdomen after hysterectomy"   KNEE ARTHROSCOPY Right 1997   KNEE SURGERY Right    TUBAL LIGATION   1996     Current Meds  Medication Sig   Abatacept (ORENCIA) 125 MG/ML SOSY Inject into the skin once a week.   ACCU-CHEK FASTCLIX LANCETS MISC USE TO PRICK FINGER TO TEST BLOOD SUGAR BEFORE BREAKFAST AND DINNER   albuterol (ACCUNEB) 1.25 MG/3ML nebulizer solution Take 3 mLs (1.25 mg total) by nebulization every 6 (six) hours as needed for wheezing.   aspirin 81 MG EC tablet Take by mouth daily. Taking 2 tablets   blood glucose meter kit and supplies KIT Dispense based on patient and insurance preference. Use up to four times daily as directed. (FOR ICD-9 250.00, 250.01). FILL BASED ON INSURANCE PREFERENCE.   carvedilol (COREG) 25 MG tablet TAKE 1 TABLET(25 MG) BY MOUTH TWICE DAILY WITH A MEAL   Continuous Blood Gluc Receiver (DEXCOM G6 RECEIVER) DEVI 1 Device by Does not apply route 3 (three) times daily before meals.   Continuous Blood Gluc Sensor (DEXCOM G6 SENSOR) MISC Inject 1 each into the skin 3 (three) times daily.   Continuous Blood Gluc Transmit (DEXCOM G6 TRANSMITTER) MISC 1 Device by Does not apply route 3 (three) times daily before meals.   diclofenac sodium (VOLTAREN) 1 % GEL Apply 1 g topically as needed.   folic acid (FOLVITE) 1 MG tablet TAKE 1 TABLET BY MOUTH EVERY MORNING (Patient taking differently: Take 1 mg by mouth daily. )   hydrALAZINE (APRESOLINE) 50 MG tablet Take 1 tablet (50 mg total) by mouth 3 (three) times daily.   hydroxychloroquine (PLAQUENIL) 200 MG tablet Take 1 tablet (200 mg total) by mouth 2 (two) times daily.   ipratropium (ATROVENT) 0.03 % nasal spray Place 2 sprays into the nose 3 (three) times daily as needed for rhinitis.   Magnesium 200 MG TABS Take 1 tablet by mouth daily.   meloxicam (MOBIC) 15 MG tablet Take 15 mg by mouth daily.    metFORMIN (GLUCOPHAGE) 500 MG tablet TAKE 1 TABLET BY MOUTH EVERY DAY WITH THE MORNING AND EVENING MEAL   methotrexate (RHEUMATREX) 2.5 MG tablet Take 20 mg by mouth every Friday.    mometasone  (NASONEX) 50 MCG/ACT nasal spray Place 2 sprays into the nose daily.   montelukast (SINGULAIR) 10 MG tablet TAKE 1 TABLET BY MOUTH DAILY   Multiple Vitamin (MULTIVITAMIN WITH MINERALS) TABS Take 1 tablet by mouth every morning.    ondansetron (ZOFRAN) 4 MG tablet Take 1 tablet (4 mg total) by mouth every 8 (eight) hours as needed for nausea or vomiting.   predniSONE (DELTASONE) 20 MG tablet Take 40 mg by mouth daily with breakfast.    sacubitril-valsartan (ENTRESTO) 97-103 MG Take 1 tablet by mouth 2 (two) times daily.   torsemide (DEMADEX) 20 MG tablet Take 1 tablet Monday, Wednesday , and Friday   traMADol (ULTRAM) 50  MG tablet Take 1 tablet (50 mg total) by mouth every 6 (six) hours as needed.   traZODone (DESYREL) 50 MG tablet Take 50 mg by mouth at bedtime.   TRESIBA FLEXTOUCH 100 UNIT/ML SOPN FlexTouch Pen INJECT 12 UNIT INTO THE SKIN EVERY NIGHT AT BEDTIME PER INSULIN PROTOCOL (Patient taking differently: Inject 12 Units into the skin. )   Vitamin D, Ergocalciferol, (DRISDOL) 1.25 MG (50000 UT) CAPS capsule Take 1 capsule (50,000 Units total) by mouth 2 (two) times a week. Every Tuesday and Friday   [DISCONTINUED] Blood Glucose Monitoring Suppl (ONETOUCH VERIO) w/Device KIT Check blood sugars twice daily E11.9   [DISCONTINUED] metolazone (ZAROXOLYN) 2.5 MG tablet TAKE 1 TABLET BY MOUTH AS NEEDED FOR WEIGHT GAIN OF 5 POUNDS WITHIN 3 DAYS AS DIRECTED   [DISCONTINUED] PROAIR HFA 108 (90 Base) MCG/ACT inhaler INHALE 2 PUFFS BY MOUTH EVERY DAY AS NEEDED FOR SHORTNESS OF BREATH   [DISCONTINUED] spironolactone (ALDACTONE) 25 MG tablet Take 1 tablet (25 mg total) by mouth every evening.     Allergies:   Hydromorphone, Iodinated diagnostic agents, Other, Erythromycin, Latex, Tape, and Mircette [desogestrel-ethinyl estradiol]   Social History   Tobacco Use   Smoking status: Never Smoker   Smokeless tobacco: Never Used  Substance Use Topics   Alcohol use: No   Drug use: No       Family Hx: The patient's family history includes Allergies in her father and mother; Arthritis in her mother; Asthma in her maternal grandmother; Cancer in her paternal grandfather; Cushing syndrome in her father; Dementia in her paternal grandmother; Depression in her father; Diabetes in her maternal grandmother; Drug abuse in her mother; Heart attack in her father and maternal grandfather; Heart murmur in her mother; Hypertension in her maternal grandmother.  ROS:   Please see the history of present illness.    Review of Systems  Constitutional: Negative.   Respiratory: Negative.   Cardiovascular: Negative.   Gastrointestinal: Positive for diarrhea and nausea. Negative for vomiting.  Genitourinary: Negative.   Psychiatric/Behavioral: Negative.     All other systems reviewed and are negative.   Labs/Other Tests and Data Reviewed:    Recent Labs: 03/29/2019: TSH 0.750 06/18/2019: ALT 12 08/14/2019: Hemoglobin 11.1; Platelets 221 08/18/2019: B Natriuretic Peptide 50.0; BUN 9; Creatinine, Ser 1.01; Potassium 4.3; Sodium 142   Recent Lipid Panel Lab Results  Component Value Date/Time   CHOL 216 (H) 03/29/2019 03:46 PM   TRIG 252 (H) 03/29/2019 03:46 PM   HDL 40 03/29/2019 03:46 PM   CHOLHDL 5.4 (H) 03/29/2019 03:46 PM   LDLCALC 126 (H) 03/29/2019 03:46 PM    Wt Readings from Last 3 Encounters:  09/07/19 191 lb (86.6 kg)  08/24/19 184 lb 9.6 oz (83.7 kg)  08/14/19 192 lb (87.1 kg)     Exam:    Vital Signs:  BP 136/83 (BP Location: Left Arm, Patient Position: Sitting, Cuff Size: Small)    Pulse 89    Ht '5\' 6"'$  (1.676 m)    Wt 191 lb (86.6 kg)    BMI 30.83 kg/m     Physical Exam  Constitutional: She is well-developed, well-nourished, and in no distress.  Pulmonary/Chest: Effort normal and breath sounds normal.  Psychiatric: Mood, memory, affect and judgment normal.    ASSESSMENT & PLAN:    1. Lower abdominal pain  She is improving today, will send her a Rx for  bentyl  I have also encouraged her take a probiotic daily to help with acid balance to  her stomach - dicyclomine (BENTYL) 20 MG tablet; Take 1 tablet (20 mg total) by mouth 3 (three) times daily as needed for spasms.  Dispense: 30 tablet; Refill: 0 - Bacillus Coagulans-Inulin (PROBIOTIC) 1-250 BILLION-MG CAPS; Take 1 capsule by mouth daily.  Dispense: 30 capsule; Refill: 2  2. Diarrhea, unspecified type  Her diarrhea is improving   Advised to use a probiotic daily  If she worsens we will need to consider stool samples     COVID-19 Education: The signs and symptoms of COVID-19 were discussed with the patient and how to seek care for testing (follow up with PCP or arrange E-visit).  The importance of social distancing was discussed today.  Patient Risk:   After full review of this patients clinical status, I feel that they are at least moderate risk at this time.  Time:   Today, I have spent 13 minutes/ seconds with the patient with telehealth technology discussing above diagnoses.     Medication Adjustments/Labs and Tests Ordered: Current medicines are reviewed at length with the patient today.  Concerns regarding medicines are outlined above.   Tests Ordered: No orders of the defined types were placed in this encounter.   Medication Changes: Meds ordered this encounter  Medications   dicyclomine (BENTYL) 20 MG tablet    Sig: Take 1 tablet (20 mg total) by mouth 3 (three) times daily as needed for spasms.    Dispense:  30 tablet    Refill:  0   Bacillus Coagulans-Inulin (PROBIOTIC) 1-250 BILLION-MG CAPS    Sig: Take 1 capsule by mouth daily.    Dispense:  30 capsule    Refill:  2    Disposition:  Follow up prn  Signed, [name], FNP

## 2019-09-08 ENCOUNTER — Other Ambulatory Visit: Payer: Self-pay

## 2019-09-08 MED ORDER — ACCU-CHEK AVIVA PLUS W/DEVICE KIT: PACK | 1 refills | Status: DC

## 2019-09-08 MED ORDER — ACCU-CHEK AVIVA PLUS VI STRP: ORAL_STRIP | 12 refills | Status: DC

## 2019-09-10 ENCOUNTER — Other Ambulatory Visit: Payer: Self-pay

## 2019-09-10 ENCOUNTER — Encounter (HOSPITAL_BASED_OUTPATIENT_CLINIC_OR_DEPARTMENT_OTHER): Payer: Self-pay | Admitting: Emergency Medicine

## 2019-09-10 ENCOUNTER — Emergency Department (HOSPITAL_BASED_OUTPATIENT_CLINIC_OR_DEPARTMENT_OTHER)
Admission: EM | Admit: 2019-09-10 | Discharge: 2019-09-11 | Disposition: A | Discharge status: Home / Self Care | Payer: Medicare Other | Attending: Emergency Medicine | Admitting: Emergency Medicine

## 2019-09-10 DIAGNOSIS — J90 Pleural effusion, not elsewhere classified: Secondary | ICD-10-CM | POA: Diagnosis not present

## 2019-09-10 DIAGNOSIS — J45909 Unspecified asthma, uncomplicated: Secondary | ICD-10-CM | POA: Diagnosis not present

## 2019-09-10 DIAGNOSIS — E119 Type 2 diabetes mellitus without complications: Secondary | ICD-10-CM | POA: Diagnosis not present

## 2019-09-10 DIAGNOSIS — Z7984 Long term (current) use of oral hypoglycemic drugs: Secondary | ICD-10-CM | POA: Diagnosis not present

## 2019-09-10 DIAGNOSIS — R102 Pelvic and perineal pain: Secondary | ICD-10-CM | POA: Insufficient documentation

## 2019-09-10 DIAGNOSIS — R300 Dysuria: Secondary | ICD-10-CM | POA: Diagnosis not present

## 2019-09-10 DIAGNOSIS — Z91040 Latex allergy status: Secondary | ICD-10-CM | POA: Diagnosis not present

## 2019-09-10 DIAGNOSIS — I313 Pericardial effusion (noninflammatory): Secondary | ICD-10-CM | POA: Diagnosis not present

## 2019-09-10 DIAGNOSIS — Z79899 Other long term (current) drug therapy: Secondary | ICD-10-CM | POA: Insufficient documentation

## 2019-09-10 DIAGNOSIS — I110 Hypertensive heart disease with heart failure: Secondary | ICD-10-CM | POA: Insufficient documentation

## 2019-09-10 DIAGNOSIS — I509 Heart failure, unspecified: Secondary | ICD-10-CM | POA: Diagnosis not present

## 2019-09-10 DIAGNOSIS — Z8541 Personal history of malignant neoplasm of cervix uteri: Secondary | ICD-10-CM | POA: Diagnosis not present

## 2019-09-10 DIAGNOSIS — Z8673 Personal history of transient ischemic attack (TIA), and cerebral infarction without residual deficits: Secondary | ICD-10-CM | POA: Insufficient documentation

## 2019-09-10 DIAGNOSIS — I3139 Other pericardial effusion (noninflammatory): Secondary | ICD-10-CM

## 2019-09-10 DIAGNOSIS — R1030 Lower abdominal pain, unspecified: Secondary | ICD-10-CM | POA: Diagnosis not present

## 2019-09-10 DIAGNOSIS — Z7982 Long term (current) use of aspirin: Secondary | ICD-10-CM | POA: Diagnosis not present

## 2019-09-10 LAB — URINALYSIS, MICROSCOPIC (REFLEX)

## 2019-09-10 LAB — URINALYSIS, ROUTINE W REFLEX MICROSCOPIC
Bilirubin Urine: NEGATIVE
Glucose, UA: NEGATIVE mg/dL
Ketones, ur: NEGATIVE mg/dL
Leukocytes,Ua: NEGATIVE
Nitrite: POSITIVE — AB
Protein, ur: NEGATIVE mg/dL
Specific Gravity, Urine: <1.005 — ABNORMAL LOW (ref 1.005–1.030)
pH: 6.5 (ref 5.0–8.0)

## 2019-09-10 NOTE — ED Triage Notes (Signed)
Dysuria with lower abd and back pain for 2 days. Diarrhea also

## 2019-09-11 ENCOUNTER — Emergency Department (HOSPITAL_BASED_OUTPATIENT_CLINIC_OR_DEPARTMENT_OTHER): Payer: Medicare Other

## 2019-09-11 ENCOUNTER — Encounter (HOSPITAL_COMMUNITY): Payer: Self-pay

## 2019-09-11 DIAGNOSIS — R1030 Lower abdominal pain, unspecified: Secondary | ICD-10-CM | POA: Diagnosis not present

## 2019-09-11 DIAGNOSIS — R102 Pelvic and perineal pain: Secondary | ICD-10-CM | POA: Diagnosis not present

## 2019-09-11 MED ORDER — CIPROFLOXACIN HCL 500 MG PO TABS
500.0000 mg | ORAL_TABLET | Freq: Once | ORAL | Status: AC
  Administered 2019-09-11: 500 mg via ORAL
  Filled 2019-09-11: qty 1

## 2019-09-11 MED ORDER — HYDROCODONE-ACETAMINOPHEN 5-325 MG PO TABS: 1.0000 | ORAL_TABLET | ORAL | 0 refills | Status: DC | PRN

## 2019-09-11 MED ORDER — HYDROCODONE-ACETAMINOPHEN 5-325 MG PO TABS
1.0000 | ORAL_TABLET | Freq: Once | ORAL | Status: AC
  Administered 2019-09-11: 1 via ORAL
  Filled 2019-09-11: qty 1

## 2019-09-11 MED ORDER — CEPHALEXIN 250 MG PO CAPS: 500.0000 mg | ORAL_CAPSULE | Freq: Once | ORAL | Status: DC

## 2019-09-11 MED ORDER — CIPROFLOXACIN HCL 500 MG PO TABS: 500.0000 mg | ORAL_TABLET | Freq: Two times a day (BID) | ORAL | 0 refills | Status: DC

## 2019-09-11 NOTE — ED Provider Notes (Signed)
[facility] Provider Note: [name], MD, FACEP  CSN: [id number] MRN: [id number] ARRIVAL: 09/10/19 at [place]: [place]  Abdominal Pain   HISTORY OF PRESENT ILLNESS  09/11/19 1:18 AM Christine Mcdowell is a 48 y.o. female who recently had gastroenteritis but has recovered from that.  She is now complaining of generalized back pain, suprapubic pain and burning with urination for 2 days.  She denies vaginal bleeding or discharge.  She rates the pain as an 8 out of 10, worse with movement or with urination.  She has not had a fever.   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   Discoid lupus    Fibromyalgia    History of blood transfusion "several"   "related to anemia; had some w/hysterectomy also"   Hx of cardiovascular stress test    ETT-Myoview (9/15):  No ischemia, EF 52%; NORMAL   Hx of echocardiogram    Echo (9/15):  EF 50-55%, ant HK, Gr 1 DD, mild MR, mild LAE, no effusion   Hypertension    Iron deficiency anemia    h/o iron transfusions   Lupus (systemic lupus erythematosus) ([place])    Migraine    "a few/year" (07/03/2016)   Pneumonia 12/2015   RA (rheumatoid arthritis) ([place])    "all over" (07/03/2016)   Sickle cell trait ([place])    Stroke ([place]) 2014 X 1; 2015 X 2; 2016 X 1;    "right side of face more relaxed than the other; rare speech hesitation" (07/03/2016)   Vaginal Pap smear, abnormal    ASCUS; HPV    Past Surgical History:  Procedure Laterality Date   ABDOMINAL HYSTERECTOMY  2009   ABDOMINAL WOUND DEHISCENCE  2009   CARDIAC CATHETERIZATION N/A 10/11/2016   Procedure: Right/Left Heart Cath and Coronary Angiography;  Surgeon: Jolaine Artist, MD;  Location: [facility];  Service: Cardiovascular;  Laterality: N/A;   DILATION AND CURETTAGE OF UTERUS  1991   HEMATOMA EVACUATION  2009   abdomen   ICD IMPLANT  01/28/2018   ICD IMPLANT N/A 01/28/2018   Procedure: ICD IMPLANT;  Surgeon: Evans Lance, MD;  Location: [facility];  Service: Cardiovascular;  Laterality: N/A;   INCISE AND DRAIN ABCESS  2009 X 2   "abdomen after hysterectomy"   KNEE ARTHROSCOPY Right 1997   KNEE SURGERY Right    TUBAL LIGATION  1996    Family History  Problem Relation Age of Onset   Arthritis Mother    Heart murmur Mother    Drug abuse Mother    Allergies Mother    Heart attack Father    Cushing syndrome Father    Depression Father    Allergies Father    Dementia Paternal Grandmother    Cancer Paternal Grandfather    Diabetes Maternal Grandmother    Hypertension Maternal Grandmother    Asthma Maternal Grandmother    Heart attack Maternal Grandfather     Social History   Tobacco Use   Smoking status: Never Smoker   Smokeless tobacco: Never Used  Substance Use Topics   Alcohol use: No   Drug use: No    Prior to Admission medications  Medication Sig Start Date End Date Taking? Authorizing Provider  Abatacept (ORENCIA) 125 MG/ML SOSY Inject into the skin once a week.    [provider]  ACCU-CHEK FASTCLIX LANCETS MISC USE TO PRICK FINGER TO TEST BLOOD SUGAR BEFORE BREAKFAST AND DINNER 08/31/18   [provider]  albuterol (ACCUNEB) 1.25 MG/3ML nebulizer solution Take 3 mLs (1.25 mg total) by nebulization every 6 (six) hours as needed for wheezing. 03/04/16   Lacretia Leigh, MD  aspirin 81 MG EC tablet Take by mouth daily. Taking 2 tablets    [provider]  Bacillus Coagulans-Inulin (PROBIOTIC) 1-250 BILLION-MG CAPS Take 1 capsule by mouth daily. 09/07/19   [name], FNP  blood glucose meter kit and supplies KIT Dispense based on patient and insurance preference. Use up to four times  daily as directed. (FOR ICD-9 250.00, 250.01). FILL BASED ON INSURANCE PREFERENCE. 08/18/19   [name], FNP  Blood Glucose Monitoring Suppl (ACCU-CHEK AVIVA PLUS) w/Device KIT DX: E11.9. Use as directed to test blood sugar 09/08/19   [name], FNP  carvedilol (COREG) 25 MG tablet TAKE 1 TABLET(25 MG) BY MOUTH TWICE DAILY WITH A MEAL 06/07/19   Bensimhon, Shaune Pascal, MD  ciprofloxacin (CIPRO) 500 MG tablet Take 1 tablet (500 mg total) by mouth 2 (two) times daily. One po bid x 7 days 09/11/19   Arrion Burruel, MD  Continuous Blood Gluc Receiver (DEXCOM G6 RECEIVER) DEVI 1 Device by Does not apply route 3 (three) times daily before meals. 07/29/19   [name], FNP  Continuous Blood Gluc Sensor (DEXCOM G6 SENSOR) MISC Inject 1 each into the skin 3 (three) times daily. 07/26/19   [name], MD  Continuous Blood Gluc Transmit (DEXCOM G6 TRANSMITTER) MISC 1 Device by Does not apply route 3 (three) times daily before meals. 07/26/19   [name], MD  diclofenac sodium (VOLTAREN) 1 % GEL Apply 1 g topically as needed.    [provider]  dicyclomine (BENTYL) 20 MG tablet Take 1 tablet (20 mg total) by mouth 3 (three) times daily as needed for spasms. 09/07/19 09/06/20  [name], FNP  folic acid (FOLVITE) 1 MG tablet TAKE 1 TABLET BY MOUTH EVERY MORNING Patient taking differently: Take 1 mg by mouth daily.  01/30/17   Bensimhon, Shaune Pascal, MD  glucose blood (ACCU-CHEK AVIVA PLUS) test strip DX: E11.9. Use as instructed 09/08/19   [name], FNP  hydrALAZINE (APRESOLINE) 50 MG tablet Take 1 tablet (50 mg total) by mouth 3 (three) times daily. 08/04/19   Bensimhon, Shaune Pascal, MD  HYDROcodone-acetaminophen (NORCO) 5-325 MG tablet Take 1 tablet by mouth every 4 (four) hours as needed (for pain). 09/11/19   Clotilda Hafer, MD  hydroxychloroquine (PLAQUENIL) 200 MG tablet Take 1 tablet (200 mg total) by mouth 2 (two) times daily. 02/02/19   Bensimhon, Shaune Pascal, MD  ipratropium (ATROVENT) 0.03 % nasal  spray Place 2 sprays into the nose 3 (three) times daily as needed for rhinitis. 06/23/19 06/22/20  [name], FNP  Magnesium 200 MG TABS Take 1 tablet by mouth daily.    [provider]  meloxicam (MOBIC) 15 MG tablet Take 15 mg by mouth daily.     [provider]  metFORMIN (GLUCOPHAGE) 500 MG tablet TAKE 1 TABLET BY MOUTH EVERY DAY WITH THE MORNING AND EVENING MEAL 05/03/19   [name], FNP  methotrexate (RHEUMATREX) 2.5 MG tablet Take 20 mg by mouth every Friday.  02/06/15   [provider]  metolazone (  ZAROXOLYN) 2.5 MG tablet TAKE 1 TABLET BY MOUTH AS NEEDED FOR WEIGHT GAIN OF 5 POUNDS WITHIN 3 DAYS AS DIRECTED 08/18/18   Bensimhon, Shaune Pascal, MD  mometasone (NASONEX) 50 MCG/ACT nasal spray Place 2 sprays into the nose daily. 08/24/19 08/23/20  [name], FNP  montelukast (SINGULAIR) 10 MG tablet TAKE 1 TABLET BY MOUTH DAILY 06/07/19   [name], FNP  Multiple Vitamin (MULTIVITAMIN WITH MINERALS) TABS Take 1 tablet by mouth every morning.     [provider]  ondansetron (ZOFRAN) 4 MG tablet Take 1 tablet (4 mg total) by mouth every 8 (eight) hours as needed for nausea or vomiting. 12/30/15   Robbie Lis, MD  predniSONE (DELTASONE) 20 MG tablet Take 40 mg by mouth daily with breakfast.     [provider]  PROAIR HFA 108 (90 Base) MCG/ACT inhaler INHALE 2 PUFFS BY MOUTH EVERY DAY AS NEEDED FOR SHORTNESS OF BREATH 06/30/19   Tanda Rockers, MD  sacubitril-valsartan (ENTRESTO) 97-103 MG Take 1 tablet by mouth 2 (two) times daily. 08/04/19   Bensimhon, Shaune Pascal, MD  spironolactone (ALDACTONE) 25 MG tablet Take 1 tablet (25 mg total) by mouth every evening. 04/23/19   Georgiana Shore, NP  torsemide [facility]) 20 MG tablet Take 1 tablet Monday, Wednesday , and Friday 08/04/19   Bensimhon, Shaune Pascal, MD  traMADol (ULTRAM) 50 MG tablet Take 1 tablet (50 mg total) by mouth every 6 (six) hours as needed. 06/23/19 06/22/20  [name], FNP  traZODone (DESYREL) 50  MG tablet Take 50 mg by mouth at bedtime.    [provider]  TRESIBA FLEXTOUCH 100 UNIT/ML SOPN FlexTouch Pen INJECT 12 UNIT INTO THE SKIN EVERY NIGHT AT BEDTIME PER INSULIN PROTOCOL Patient taking differently: Inject 12 Units into the skin.  06/30/19   [name], FNP  Vitamin D, Ergocalciferol, (DRISDOL) 1.25 MG (50000 UT) CAPS capsule Take 1 capsule (50,000 Units total) by mouth 2 (two) times a week. Every Tuesday and Friday 05/10/19   [name], FNP    Allergies Hydromorphone, Iodinated diagnostic agents, Other, Erythromycin, Latex, Tape, and Mircette [desogestrel-ethinyl estradiol]   REVIEW OF SYSTEMS  Negative except as noted here or in the History of Present Illness.   PHYSICAL EXAMINATION  Initial Vital Signs Blood pressure 125/73, pulse 82, temperature 98.8 F (37.1 C), resp. rate 16, SpO2 99 %.  Examination General: Well-developed, well-nourished female in no acute distress; appearance consistent with age of record HENT: normocephalic; atraumatic Eyes: pupils equal, round and reactive to light; extraocular muscles intact Neck: supple Heart: regular rate and rhythm; no murmurs, rubs or gallops Lungs: clear to auscultation bilaterally Abdomen: soft; nondistended; suprapubic tenderness; bowel sounds present GU: Bilateral CVA tenderness Extremities: No deformity; full range of motion; pulses normal Neurologic: Awake, alert and oriented; motor function intact in all extremities and symmetric; no facial droop Skin: Warm and dry Psychiatric: Normal mood and affect   RESULTS  Summary of this visit's results, reviewed by myself:   EKG Interpretation  Date/Time:    Ventricular Rate:    PR Interval:    QRS Duration:   QT Interval:    QTC Calculation:   R Axis:     Text Interpretation:        Laboratory Studies: Results for orders placed or performed during the hospital encounter of 09/10/19 (from the past 24 hour(s))  Urinalysis, Routine w reflex  microscopic     Status: Abnormal   Collection Time: 09/10/19 11:42 PM  Result Value Ref Range   Color, Urine ORANGE (A) YELLOW   APPearance CLEAR CLEAR   Specific Gravity, Urine <1.005 (L) 1.005 - 1.030   pH 6.5 5.0 - 8.0   Glucose, UA NEGATIVE NEGATIVE mg/dL   Hgb urine dipstick SMALL (A) NEGATIVE   Bilirubin Urine NEGATIVE NEGATIVE   Ketones, ur NEGATIVE NEGATIVE mg/dL   Protein, ur NEGATIVE NEGATIVE mg/dL   Nitrite POSITIVE (A) NEGATIVE   Leukocytes,Ua NEGATIVE NEGATIVE  Urinalysis, Microscopic (reflex)     Status: Abnormal   Collection Time: 09/10/19 11:42 PM  Result Value Ref Range   RBC / HPF 0-5 0 - 5 RBC/hpf   WBC, UA 0-5 0 - 5 WBC/hpf   Bacteria, UA FEW (A) NONE SEEN   Squamous Epithelial / LPF 0-5 0 - 5   Imaging Studies: Ct Renal Stone Study  Result Date: 09/11/2019 CLINICAL DATA:  Dysuria with lower abdominal pain EXAM: CT ABDOMEN AND PELVIS WITHOUT CONTRAST TECHNIQUE: Multidetector CT imaging of the abdomen and pelvis was performed following the standard protocol without IV contrast. COMPARISON:  CT dated June 17, 2008 FINDINGS: Lower chest: There are small bilateral pleural effusions.The heart size is mildly enlarged. There is a small pericardial effusion. Hepatobiliary: The liver is normal. Normal gallbladder.There is no biliary ductal dilation. Pancreas: Normal contours without ductal dilatation. No peripancreatic fluid collection. Spleen: No splenic laceration or hematoma. Adrenals/Urinary Tract: --Adrenal glands: No adrenal hemorrhage. --Right kidney/ureter: No hydronephrosis or perinephric hematoma. --Left kidney/ureter: No hydronephrosis or perinephric hematoma. --Urinary bladder: Unremarkable. Stomach/Bowel: --Stomach/Duodenum: No hiatal hernia or other gastric abnormality. Normal duodenal course and caliber. --Small bowel: No dilatation or inflammation. --Colon: No focal abnormality. --Appendix: No evidence for acute appendicitis. Vascular/Lymphatic: Normal course and  caliber of the major abdominal vessels. --No retroperitoneal lymphadenopathy. --No mesenteric lymphadenopathy. --No pelvic or inguinal lymphadenopathy. Reproductive: Unremarkable Other: No ascites or free air. The abdominal wall is normal. Musculoskeletal. No acute displaced fractures. IMPRESSION: 1. No acute abdominopelvic abnormality. 2. Small bilateral pleural effusions and small pericardial effusion. Electronically Signed   By: Constance Holster M.D.   On: 09/11/2019 01:58    ED COURSE and MDM  Nursing notes and initial vitals signs, including pulse oximetry, reviewed.  Vitals:   09/10/19 2305 09/11/19 0202  BP: 125/73 128/82  Pulse: 82 70  Resp: 16 16  Temp: 98.8 F (37.1 C)   SpO2: 99% 98%   Will treat for urinary tract infection based on symptomatology although her urine does not show a definitive infection.  She is on immunosuppressive drugs however.  Urine has been sent for culture.  Patient advised of CT findings showing pericardial effusion and bilateral pleural effusions.  PROCEDURES    ED DIAGNOSES     ICD-10-CM   1. Suprapubic pain  R10.2   2. Dysuria  R30.0   3. Pericardial effusion  I31.3   4. Bilateral pleural effusion  J90        Sharell Hilmer, Jenny Reichmann, MD 09/11/19 [phone]

## 2019-09-11 NOTE — ED Notes (Signed)
Pt denies N/V/D at this time/ today. Pt reports having the vomiting and diarrhea last Sunday after [place] [employer] [place]. Pt did virtual visit and was prescribed diclofenac that she just got filled and has helped.

## 2019-09-12 LAB — URINE CULTURE: Culture: <10000 — AB

## 2019-09-13 DIAGNOSIS — D649 Anemia, unspecified: Secondary | ICD-10-CM | POA: Diagnosis not present

## 2019-09-13 DIAGNOSIS — D688 Other specified coagulation defects: Secondary | ICD-10-CM | POA: Diagnosis not present

## 2019-09-13 DIAGNOSIS — Z862 Personal history of diseases of the blood and blood-forming organs and certain disorders involving the immune mechanism: Secondary | ICD-10-CM | POA: Diagnosis not present

## 2019-09-14 ENCOUNTER — Other Ambulatory Visit (HOSPITAL_COMMUNITY): Payer: Self-pay | Admitting: Cardiology

## 2019-09-14 ENCOUNTER — Other Ambulatory Visit (HOSPITAL_COMMUNITY): Payer: Self-pay

## 2019-09-14 ENCOUNTER — Other Ambulatory Visit: Payer: Self-pay | Admitting: Internal Medicine

## 2019-09-14 MED ORDER — PROAIR HFA 108 (90 BASE) MCG/ACT IN AERS: INHALATION_SPRAY | RESPIRATORY_TRACT | 0 refills | Status: DC

## 2019-09-14 MED ORDER — METOLAZONE 2.5 MG PO TABS: ORAL_TABLET | ORAL | 3 refills | Status: DC

## 2019-09-16 ENCOUNTER — Encounter: Payer: Self-pay | Admitting: Nurse Practitioner

## 2019-09-16 NOTE — Progress Notes (Signed)
Remote ICD transmission.   

## 2019-09-20 NOTE — Telephone Encounter (Signed)
Please call [facility] to see if they have a form to complete for test strips

## 2019-09-24 ENCOUNTER — Other Ambulatory Visit (HOSPITAL_COMMUNITY): Payer: Self-pay

## 2019-09-24 ENCOUNTER — Other Ambulatory Visit: Payer: Self-pay | Admitting: Nurse Practitioner

## 2019-09-24 DIAGNOSIS — Z794 Long term (current) use of insulin: Secondary | ICD-10-CM | POA: Diagnosis not present

## 2019-09-24 DIAGNOSIS — E119 Type 2 diabetes mellitus without complications: Secondary | ICD-10-CM | POA: Diagnosis not present

## 2019-09-24 MED ORDER — SPIRONOLACTONE 25 MG PO TABS: ORAL_TABLET | ORAL | 3 refills | Status: DC

## 2019-09-25 ENCOUNTER — Other Ambulatory Visit: Payer: Self-pay | Admitting: Nurse Practitioner

## 2019-09-27 ENCOUNTER — Ambulatory Visit: Payer: Self-pay

## 2019-09-27 ENCOUNTER — Other Ambulatory Visit (HOSPITAL_COMMUNITY): Payer: Self-pay

## 2019-09-27 ENCOUNTER — Ambulatory Visit: Payer: Self-pay | Admitting: Pharmacist

## 2019-09-27 ENCOUNTER — Other Ambulatory Visit: Payer: Self-pay

## 2019-09-27 DIAGNOSIS — I10 Essential (primary) hypertension: Secondary | ICD-10-CM

## 2019-09-27 DIAGNOSIS — M0579 Rheumatoid arthritis with rheumatoid factor of multiple sites without organ or systems involvement: Secondary | ICD-10-CM | POA: Diagnosis not present

## 2019-09-27 DIAGNOSIS — Z79899 Other long term (current) drug therapy: Secondary | ICD-10-CM | POA: Diagnosis not present

## 2019-09-27 DIAGNOSIS — E1169 Type 2 diabetes mellitus with other specified complication: Secondary | ICD-10-CM

## 2019-09-27 MED ORDER — ACCU-CHEK GUIDE VI STRP: ORAL_STRIP | 12 refills | Status: DC

## 2019-09-27 MED ORDER — HYDRALAZINE HCL 50 MG PO TABS: 50.0000 mg | ORAL_TABLET | Freq: Three times a day (TID) | ORAL | 6 refills | Status: DC

## 2019-09-27 NOTE — Chronic Care Management (AMB) (Signed)
  Chronic Care Management   Social Work Note  09/27/2019 Name: Silvestra Kate MRN: [id number] DOB: 1971/05/09  SW placed a follow up call to the patient to confirm receipt of mailed resources and assist with further education. Patient confirms receipt but declines ability to review resources at this time due her youngest grandson (born approximately 3 weeks ago) undergoing open heart surgery recently. "I just haven't been able to look at what you sent me".   Follow Up Plan: Rescheduled call with the patient to allow patient time to review resources. SW will follow up with the patient over the next two weeks.  Daneen Schick, BSW, CDP Social Worker, Certified Dementia Practitioner [facility] [phone]

## 2019-09-28 NOTE — Patient Instructions (Signed)
Visit Information  Goals Addressed            This Visit's Progress     Patient Stated   . COMPLETED: "I need help getting a new glucometer scanner" (pt-stated)       Current Barriers:  . Issues replacing receiver due to insurance guidelines . Financial limitations to cover out of pocket costs  Clinical Social Work Clinical Goal(s):  Marland Kitchen Over the next 45 days the patient will work with embedded PharmD to address glucose monitoring system needs.  CCM SW Interventions: Completed 09/03/2019 . Initial outreach to the patient, proper assessment performs . Determined the patient needs assistance in obtaining a new receiver for her DexCom G6. The patient obtained this monitoring system less than one year ago. Unfortunately, approximately two months ago the receiver was thrown away . "My [employer] says it is too soon to replace and Medicare says it is considered DME" . Assessed for patients ability to check blood sugar readings at this time . 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 Advised the patient she would be contacted by embedded PharmD to further explore glucose monitoring options  Patient Self Care Activities:  . Self administers medications as prescribed . Calls pharmacy for medication refills . Calls provider office for new concerns or questions  Initial goal documentation     . I need help getting a replacement Dexcom scanner glucometer (pt-stated)       Current Barriers:  . Issues replacing receiver due to insurance guidelines . Financial limitations to cover out of pocket costs  CCM PharmD Clinical Goal(s):  Marland Kitchen Over the next 45 days the patient will work with PCP and PharmD to address glucose monitoring system  needs.  CCM PharmDInterventions: Completed call on 09/27/2019 . Comprehensive medication review performed.  Updated medication list in the EMR. . Determined the patient needs assistance in obtaining a new receiver for her DexCom G6. The patient obtained this monitoring system less than one year ago. Unfortunately, approximately two months ago the receiver was thrown away . "My [employer] says it is too soon to replace and Medicare says it is considered DME".  Called [employer] and requested a "lost override" for Dexcom scanner device.  This was denied.  Only 1 scanner device is allowed per 365 calendar days. . Assessed for patients ability to check blood sugar readings at this time . 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  Will discuss with PCP about finger checks.  I'm unsure of the need to check blood sugar 1-12 times per day unless patient was to be put of sliding scale.  She is checking her blood sugar this often, however no action is being taken.  She is on prednisone 40mg  daily and may need sliding scale based on her readings.  Will explore Freestyle Libre possibilities as able. . Will have Accucheck Guide strips called in for patient.  She was able to get AccuCheck Guide machine.  She would like test strips sent  to [facility] Phone: [phone]  Text: [phone]  Fax: [phone]    . Patient Self Care Activities:  . Self administers medications as prescribed . Calls pharmacy for medication refills . Calls provider office for new concerns or questions   Please see past updates related to this goal by clicking on the "Past Updates" button in the selected goal       . I would like to control my blood  sugar (pt-stated)       Current Barriers:  . Diabetes: T2DM; most recent A1c 6.4% on 06/18/19  . Current antihyperglycemic regimen: Tresiba 10 units qHS, Novolog sliding scale, Metformin . Denies hypoglycemic symptoms; denies hyperglycemic symptoms . Current meal patterns: n/a . Current exercise: n/a . Current blood glucose readings: 150-200 (currently on prednisone 20-40mg  daily for autoimmune conditions which presents difficulty managing blodd sugars) . Cardiovascular risk reduction: o Current hypertensive regimen: carvedilol, spironolactone, hydralazine, entresto o Current hyperlipidemia regimen: not currently on treatment-->will discuss; no allergy/intolerance noted  Pharmacist Clinical Goal(s):  Marland Kitchen Over the next 90 days, patient with work with PharmD and primary care provider to address needs related to optimized medication management of chronic conditions  Interventions: . Comprehensive medication review performed, medication list updated in electronic medical record.  Reviewed medication fill history via insurance claims data confirming patient appears compliant with having her medications filled on time as prescribed by provider. . Reviewed & discussed the following diabetes-related information with patient: o Continue checking blood sugars as directed o Follow ADA recommended "diabetes-friendly" diet  (reviewed healthy snack/food options) o Discussed insulin/sliding scale; Patient uses AccuCheck GUIDE glucometer o Reviewed medication purpose/side effects-->patient denies adverse events o Continue taking all medications as prescribed by provider   Patient Self Care Activities:  . Patient will check blood glucose 3-4 times daily, document, and provide at future appointments . Patient will focus on medication adherence by continuing to take medications as prescribed . Patient will take medications as prescribed . Patient will contact provider with any episodes of  hypoglycemia . Patient will report any questions or concerns to provider   Initial goal documentation        The patient verbalized understanding of instructions provided today and declined a print copy of patient instruction materials.   The care management team will reach out to the patient again over the next 4 weeks.  Regina Eck, PharmD, BCPS Clinical Pharmacist, [facility] [place]: [phone]

## 2019-09-28 NOTE — Progress Notes (Signed)
Chronic Care Management   Visit Note  09/27/2019 Name: Christine Mcdowell MRN: [id number] DOB: [birth year]  Referred by: [name], MD Reason for referral : Chronic Care Management   Christine Mcdowell is a 48 y.o. year old female who is a primary care patient of [name], MD. The CCM team was consulted for assistance with chronic disease management and care coordination needs related to HTN, HLD, DMII  Review of patient status, including review of consultants reports, relevant laboratory and other test results, and collaboration with appropriate care team members and the patient's provider was performed as part of comprehensive patient evaluation and provision of chronic care management services.    I spoke with Christine Mcdowell by telephone today.  Advanced Directives Status: N See Care Plan and Vyn[facility] application for related entries.   Medications: Outpatient Encounter Medications as of 09/27/2019  Medication Sig   Abatacept (ORENCIA) 125 MG/ML SOSY Inject into the skin once a week.   ACCU-CHEK FASTCLIX LANCETS MISC USE TO PRICK FINGER TO TEST BLOOD SUGAR BEFORE BREAKFAST AND DINNER   albuterol (ACCUNEB) 1.25 MG/3ML nebulizer solution Take 3 mLs (1.25 mg total) by nebulization every 6 (six) hours as needed for wheezing.   aspirin 81 MG EC tablet Take by mouth daily. Taking 2 tablets   Bacillus Coagulans-Inulin (PROBIOTIC) 1-250 BILLION-MG CAPS Take 1 capsule by mouth daily.   blood glucose meter kit and supplies KIT Dispense based on patient and insurance preference. Use up to four times daily as directed. (FOR ICD-9 250.00, 250.01). FILL BASED ON INSURANCE PREFERENCE.   Blood Glucose Monitoring Suppl (ACCU-CHEK AVIVA PLUS) w/Device KIT DX: E11.9. Use as directed to test blood sugar   carvedilol (COREG) 25 MG tablet TAKE 1 TABLET(25 MG) BY MOUTH TWICE DAILY WITH A MEAL   ciprofloxacin (CIPRO) 500 MG tablet Take 1 tablet (500 mg total) by mouth 2 (two) times daily. One  po bid x 7 days   Continuous Blood Gluc Receiver (DEXCOM G6 RECEIVER) DEVI 1 Device by Does not apply route 3 (three) times daily before meals.   Continuous Blood Gluc Sensor (DEXCOM G6 SENSOR) MISC Inject 1 each into the skin 3 (three) times daily.   Continuous Blood Gluc Transmit (DEXCOM G6 TRANSMITTER) MISC 1 Device by Does not apply route 3 (three) times daily before meals.   diclofenac sodium (VOLTAREN) 1 % GEL Apply 1 g topically as needed.   dicyclomine (BENTYL) 20 MG tablet Take 1 tablet (20 mg total) by mouth 3 (three) times daily as needed for spasms.   folic acid (FOLVITE) 1 MG tablet TAKE 1 TABLET BY MOUTH EVERY MORNING (Patient taking differently: Take 1 mg by mouth daily. )   hydrALAZINE (APRESOLINE) 50 MG tablet Take 1 tablet (50 mg total) by mouth 3 (three) times daily.   HYDROcodone-acetaminophen (NORCO) 5-325 MG tablet Take 1 tablet by mouth every 4 (four) hours as needed (for pain).   hydroxychloroquine (PLAQUENIL) 200 MG tablet Take 1 tablet (200 mg total) by mouth 2 (two) times daily.   ipratropium (ATROVENT) 0.03 % nasal spray Place 2 sprays into the nose 3 (three) times daily as needed for rhinitis.   Magnesium 200 MG TABS Take 1 tablet by mouth daily.   meloxicam (MOBIC) 15 MG tablet Take 15 mg by mouth daily.    metFORMIN (GLUCOPHAGE) 500 MG tablet TAKE 1 TABLET BY MOUTH EVERY DAY WITH THE MORNING AND EVENING MEAL   methotrexate (RHEUMATREX) 2.5 MG tablet Take 20 mg by mouth every Friday.  metolazone (ZAROXOLYN) 2.5 MG tablet TAKE 1 TABLET BY MOUTH AS NEEDED FOR WEIGHT GAIN OF 5 POUNDS WITHIN 3 DAYS AS DIRECTED   mometasone (NASONEX) 50 MCG/ACT nasal spray Place 2 sprays into the nose daily.   montelukast (SINGULAIR) 10 MG tablet TAKE 1 TABLET BY MOUTH DAILY   Multiple Vitamin (MULTIVITAMIN WITH MINERALS) TABS Take 1 tablet by mouth every morning.    ondansetron (ZOFRAN) 4 MG tablet Take 1 tablet (4 mg total) by mouth every 8 (eight) hours as needed  for nausea or vomiting.   predniSONE (DELTASONE) 20 MG tablet Take 40 mg by mouth daily with breakfast.    PROAIR HFA 108 (90 Base) MCG/ACT inhaler INHALE 2 PUFFS BY MOUTH EVERY DAY AS NEEDED FOR SHORTNESS OF BREATH   sacubitril-valsartan (ENTRESTO) 97-103 MG Take 1 tablet by mouth 2 (two) times daily.   spironolactone (ALDACTONE) 25 MG tablet TAKE 1 TABLET(25 MG) BY MOUTH TWICE DAILY   torsemide (DEMADEX) 20 MG tablet Take 1 tablet Monday, Wednesday , and Friday   traMADol (ULTRAM) 50 MG tablet Take 1 tablet (50 mg total) by mouth every 6 (six) hours as needed.   traZODone (DESYREL) 50 MG tablet Take 50 mg by mouth at bedtime.   TRESIBA FLEXTOUCH 100 UNIT/ML SOPN FlexTouch Pen INJECT 12 UNIT INTO THE SKIN EVERY NIGHT AT BEDTIME PER INSULIN PROTOCOL (Patient taking differently: Inject 12 Units into the skin. )   [DISCONTINUED] glucose blood (ACCU-CHEK AVIVA PLUS) test strip DX: E11.9. Use as instructed   [DISCONTINUED] Vitamin D, Ergocalciferol, (DRISDOL) 1.25 MG (50000 UT) CAPS capsule Take 1 capsule (50,000 Units total) by mouth 2 (two) times a week. Every Tuesday and Friday   No facility-administered encounter medications on file as of 09/27/2019.      Objective:   Goals Addressed            This Visit's Progress     Patient Stated    COMPLETED: "I need help getting a new glucometer scanner" (pt-stated)       Current Barriers:   Issues replacing receiver due to insurance guidelines  Financial limitations to cover out of pocket costs  Clinical Social Work Clinical Goal(s):   Over the next 45 days the patient will work with embedded PharmD to address glucose monitoring system needs.  CCM SW Interventions: Completed 09/03/2019  Initial outreach to the patient, proper assessment performs  Determined the patient needs assistance in obtaining a new receiver for her DexCom G6. The patient obtained this monitoring system less than one year ago. 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 "my specialist doesn't want me sticking my finger that much"  Advised the patient she would be contacted by embedded PharmD to further explore glucose monitoring options  Patient Self Care Activities:   Self administers medications as prescribed  Calls pharmacy for medication refills  Calls provider office for new concerns or questions  Initial goal documentation      I need help getting a replacement Dexcom scanner glucometer (pt-stated)       Current Barriers:  Issues replacing receiver due to insurance guidelines  Financial limitations to cover out of pocket costs  CCM PharmD Clinical Goal(s):   Over the next 45 days the patient will work with PCP and PharmD to address glucose monitoring system needs.  CCM PharmDInterventions: Completed call on 09/27/2019  Comprehensive medication review performed.  Updated medication list in the EMR.  Determined the patient needs assistance in obtaining a new receiver for her DexCom G6. The patient obtained this monitoring system less than one year ago. Unfortunately, approximately two months ago the receiver was thrown away  "My BCBS says it is too soon to replace and Medicare says it is considered DME".  Called [employer] and requested a "lost override" for Dexcom scanner device.  This was denied.  Only 1 scanner device is allowed per 365 calendar days.  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  Will discuss with PCP about finger checks.  I'm unsure of the need to check blood sugar 1-12 times per day unless patient was to be put of sliding scale.  She is checking her blood sugar this often, however no action is being taken.  She is on prednisone '40mg'$  daily and may need sliding scale based on her readings.  Will explore Freestyle Libre possibilities as able.  Will have Accucheck Guide strips called in for patient.  She was able to get AccuCheck Guide machine.  [facility] Phone: [phone]   Text: [phone]   Fax: [phone]     Patient Self Care Activities:   Self administers medications as prescribed  Calls pharmacy for medication refills  Calls provider office for new concerns or questions   Please see past updates related to this goal by clicking on the "Past Updates" button in the selected goal        I would like to control my blood sugar (pt-stated)       Current Barriers:   Diabetes: T2DM; most recent A1c 6.4% on 06/18/19   Current antihyperglycemic regimen: Tresiba 10 units qHS, Novolog sliding scale, Metformin  Denies hypoglycemic symptoms; denies hyperglycemic symptoms  Current meal patterns: n/a  Current exercise: n/a  Current blood glucose readings: 150-200 (currently on prednisone 20-'40mg'$  daily for autoimmune conditions which presents difficulty managing blodd sugars)  Cardiovascular risk reduction: o Current hypertensive regimen: carvedilol, spironolactone, hydralazine, entresto o Current hyperlipidemia regimen: not currently on treatment-->will discuss; no allergy/intolerance noted  Pharmacist Clinical Goal(s):   Over the next 90 days,  patient with work with PharmD and primary care provider to address needs related to optimized medication management of chronic conditions  Interventions:  Comprehensive medication review performed, medication list updated in electronic medical record.  Reviewed medication fill history via insurance claims data confirming patient appears compliant with having her medications filled on time as prescribed by provider.  Reviewed & discussed the following diabetes-related information with patient: o Continue checking blood sugars as directed o Follow ADA recommended "diabetes-friendly" diet  (reviewed healthy snack/food options) o Discussed insulin/sliding scale; Patient uses AccuCheck GUIDE glucometer o Reviewed medication purpose/side effects-->patient denies adverse events o Continue taking all medications as prescribed by  provider   Patient Self Care Activities:   Patient will check blood glucose 3-4 times daily, document, and provide at future appointments  Patient will focus on medication adherence by continuing to take medications as prescribed  Patient will take medications as prescribed  Patient will contact provider with any episodes of hypoglycemia  Patient will report any questions or concerns to provider   Initial goal documentation        Plan:   The care management team will reach out to the patient again over the next 4 weeks.  Regina Eck, PharmD, BCPS Clinical Pharmacist, [facility] [place]: [phone]

## 2019-10-01 ENCOUNTER — Telehealth: Payer: Self-pay

## 2019-10-06 ENCOUNTER — Ambulatory Visit: Payer: Self-pay

## 2019-10-06 DIAGNOSIS — E1169 Type 2 diabetes mellitus with other specified complication: Secondary | ICD-10-CM

## 2019-10-06 DIAGNOSIS — I10 Essential (primary) hypertension: Secondary | ICD-10-CM

## 2019-10-06 NOTE — Chronic Care Management (AMB) (Signed)
Chronic Care Management   Social Work Follow Up Note  10/06/2019 Name: Christine Mcdowell MRN: [id number] DOB: [birth year]  Christine Mcdowell is a 48 y.o. year old female who is a primary care patient of [name] FNP. The CCM team was consulted for assistance with [employer] .   Review of patient status, including review of consultants reports, other relevant assessments, and collaboration with appropriate care team members and the patient's provider was performed as part of comprehensive patient evaluation and provision of chronic care management services.    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 See Care Plan for related entries.    Outpatient Encounter Medications as of 10/06/2019  Medication Sig  . Abatacept (ORENCIA) 125 MG/ML SOSY Inject into the skin once a week.  Marland Kitchen ACCU-CHEK FASTCLIX LANCETS MISC USE TO PRICK FINGER TO TEST BLOOD SUGAR BEFORE BREAKFAST AND DINNER  . albuterol (ACCUNEB) 1.25 MG/3ML nebulizer solution Take 3 mLs (1.25 mg total) by nebulization every 6 (six) hours as needed for wheezing.  Marland Kitchen aspirin 81 MG EC tablet Take by mouth daily. Taking 2 tablets  . Bacillus Coagulans-Inulin (PROBIOTIC) 1-250 BILLION-MG CAPS Take 1 capsule by mouth daily.  . blood glucose meter kit and supplies KIT Dispense based on patient and insurance preference. Use up to four times daily as directed. (FOR ICD-9 250.00, 250.01). FILL BASED ON INSURANCE PREFERENCE.  Marland Kitchen Blood Glucose Monitoring Suppl (ACCU-CHEK AVIVA PLUS) w/Device KIT DX: E11.9. Use as directed to test blood sugar  . carvedilol (COREG) 25 MG tablet TAKE 1 TABLET(25 MG) BY MOUTH TWICE DAILY WITH A MEAL  . ciprofloxacin (CIPRO) 500 MG tablet Take 1 tablet (500 mg total) by mouth 2 (two) times daily. One po bid x 7 days  . citalopram (CELEXA) 10 MG tablet TAKE 1 TABLET BY MOUTH EVERY DAY  . Continuous Blood Gluc Receiver (DEXCOM G6 RECEIVER) DEVI 1 Device by Does not  apply route 3 (three) times daily before meals.  . Continuous Blood Gluc Sensor (DEXCOM G6 SENSOR) MISC Inject 1 each into the skin 3 (three) times daily.  . Continuous Blood Gluc Transmit (DEXCOM G6 TRANSMITTER) MISC 1 Device by Does not apply route 3 (three) times daily before meals.  . diclofenac sodium (VOLTAREN) 1 % GEL Apply 1 g topically as needed.  . dicyclomine (BENTYL) 20 MG tablet Take 1 tablet (20 mg total) by mouth 3 (three) times daily as needed for spasms.  . folic acid (FOLVITE) 1 MG tablet TAKE 1 TABLET BY MOUTH EVERY MORNING (Patient taking differently: Take 1 mg by mouth daily. )  . glucose blood (ACCU-CHEK GUIDE) test strip DX: E11.9 Use as instructed to test blood sugars 3-4 times daily.  . hydrALAZINE (APRESOLINE) 50 MG tablet Take 1 tablet (50 mg total) by mouth 3 (three) times daily.  Marland Kitchen HYDROcodone-acetaminophen (NORCO) 5-325 MG tablet Take 1 tablet by mouth every 4 (four) hours as needed (for pain).  . hydroxychloroquine (PLAQUENIL) 200 MG tablet Take 1 tablet (200 mg total) by mouth 2 (two) times daily.  Marland Kitchen ipratropium (ATROVENT) 0.03 % nasal spray Place 2 sprays into the nose 3 (three) times daily as needed for rhinitis.  . Magnesium 200 MG TABS Take 1 tablet by mouth daily.  . meloxicam (MOBIC) 15 MG tablet Take 15 mg by mouth daily.   . metFORMIN (GLUCOPHAGE) 500 MG tablet TAKE 1 TABLET BY MOUTH EVERY DAY WITH THE MORNING AND EVENING MEAL  . methotrexate (RHEUMATREX) 2.5 MG tablet Take 20  mg by mouth every Friday.   . metolazone (ZAROXOLYN) 2.5 MG tablet TAKE 1 TABLET BY MOUTH AS NEEDED FOR WEIGHT GAIN OF 5 POUNDS WITHIN 3 DAYS AS DIRECTED  . mometasone (NASONEX) 50 MCG/ACT nasal spray Place 2 sprays into the nose daily.  . montelukast (SINGULAIR) 10 MG tablet TAKE 1 TABLET BY MOUTH DAILY  . Multiple Vitamin (MULTIVITAMIN WITH MINERALS) TABS Take 1 tablet by mouth every morning.   Marland Kitchen NOVOLOG FLEXPEN 100 UNIT/ML FlexPen INJECT UP TO 17 UNITS THREE TIMES DAILY BY SLIDING  SCALE AS DIRECTED  . ondansetron (ZOFRAN) 4 MG tablet Take 1 tablet (4 mg total) by mouth every 8 (eight) hours as needed for nausea or vomiting.  . predniSONE (DELTASONE) 20 MG tablet Take 40 mg by mouth daily with breakfast.   . PROAIR HFA 108 (90 Base) MCG/ACT inhaler INHALE 2 PUFFS BY MOUTH EVERY DAY AS NEEDED FOR SHORTNESS OF BREATH  . sacubitril-valsartan (ENTRESTO) 97-103 MG Take 1 tablet by mouth 2 (two) times daily.  Marland Kitchen spironolactone (ALDACTONE) 25 MG tablet TAKE 1 TABLET(25 MG) BY MOUTH TWICE DAILY  . torsemide (DEMADEX) 20 MG tablet Take 1 tablet Monday, Wednesday , and Friday  . traMADol (ULTRAM) 50 MG tablet Take 1 tablet (50 mg total) by mouth every 6 (six) hours as needed.  . traZODone (DESYREL) 50 MG tablet Take 50 mg by mouth at bedtime.  . TRESIBA FLEXTOUCH 100 UNIT/ML SOPN FlexTouch Pen INJECT 12 UNIT INTO THE SKIN EVERY NIGHT AT BEDTIME PER INSULIN PROTOCOL (Patient taking differently: Inject 12 Units into the skin. )  . Vitamin D, Ergocalciferol, (DRISDOL) 1.25 MG (50000 UT) CAPS capsule TAKE 1 CAPSULE BY MOUTH 2 TIMES EVERY WEEK   No facility-administered encounter medications on file as of 10/06/2019.      Goals Addressed            This Visit's Progress     Patient Stated   . "I need somewhere to stay" (pt-stated)   Not on track    Current Barriers:  . Financial constraints related to long wait period for disability approval  Clinical Social Work Clinical Goal(s):  Marland Kitchen Over the next 45 days the patient will become more knowledgeable of low income housing options within [place] Interventions: Completed 10/06/2019 . Outbound call to the patient to assess progression of patient stated goal . Confirmed the patient has received resources regarding housing options . Informed by the patient she would need a place to live by Wednesday October 21 due to [place] home visit assessment scheduled by DSS "my grand-daughter has to be in her room" .  Discussed challenges in locating housing in such a short time- the patient is not interested in accessing a shelter at this time. The patient is unable to identify family or friends she would be able to stay with . Assessed for patient financial ability to move into an apartment at this time - the patient identifies some savings but not enough to cover first month rent and deposit for most apartments . Encouraged the patient to contact low income housing options provided by SW in case there may be an opening . Referred patient to [place] via [place] in hopes of locating housing resources to assist with patient needs  Patient Self Care Activities:  . Self administers medications as prescribed . Calls pharmacy for medication refills . Calls provider office for new concerns or questions  Please see past updates related to this goal by clicking on  the "Past Updates" button in the selected goal      . COMPLETED: "I want resources for transportation" (pt-stated)       Current Barriers:  . Financial insecurities . Limited knowledge of community resources to assist with transportation  Clinical Social Work Clinical Goal(s):  Marland Kitchen Over the next 45 days, client will follow up with ADTS as directed by SW  Interventions: Completed 10/06/2019 . Outbound call to the patient to assess progression of patient stated goal . Determined the patient has been contacted by ADTS regarding transportation referral . Assessed for patient understanding of how to access transportation services . Patient confirms she is enrolled and was educated on how to access benefit . Goal Met  Patient Self Care Activities:  . Currently UNABLE TO independently provide own transportation  Please see past updates related to this goal by clicking on the "Past Updates" button in the selected goal          Follow Up Plan: SW will follow up with patient by phone over the next week.   Daneen Schick, BSW, CDP Social Worker,  Certified Dementia Practitioner [place] [facility] [place] [facility] [phone]  Total time spent performing care coordination and/or care management activities with the patient by phone or face to face = 16 minutes.

## 2019-10-06 NOTE — Patient Instructions (Signed)
Social Worker Visit Information  Goals we discussed today:  Goals Addressed            This Visit's Progress     Patient Stated   . "I need somewhere to stay" (pt-stated)   Not on track    Current Barriers:  . Financial constraints related to long wait period for disability approval  Clinical Social Work Clinical Goal(s):  Marland Kitchen Over the next 45 days the patient will become more knowledgeable of low income housing options within [place] Interventions: Completed 10/06/2019 . Outbound call to the patient to assess progression of patient stated goal . Confirmed the patient has received resources regarding housing options . Informed by the patient she would need a place to live by Wednesday October 21 due to [place] home visit assessment scheduled by DSS "my grand-daughter has to be in her room" . Discussed challenges in locating housing in such a short time- the patient is not interested in accessing a shelter at this time. The patient is unable to identify family or friends she would be able to stay with . Assessed for patient financial ability to move into an apartment at this time - the patient identifies some savings but not enough to cover first month rent and deposit for most apartments . Encouraged the patient to contact low income housing options provided by SW in case there may be an opening . Referred patient to [place] via [place] in hopes of locating housing resources to assist with patient needs  Patient Self Care Activities:  . Self administers medications as prescribed . Calls pharmacy for medication refills . Calls provider office for new concerns or questions  Please see past updates related to this goal by clicking on the "Past Updates" button in the selected goal      . COMPLETED: "I want resources for transportation" (pt-stated)       Current Barriers:  . Financial insecurities . Limited knowledge of community resources to assist with  transportation  Clinical Social Work Clinical Goal(s):  Marland Kitchen Over the next 45 days, client will follow up with ADTS as directed by SW  Interventions: Completed 10/06/2019 . Outbound call to the patient to assess progression of patient stated goal . Determined the patient has been contacted by ADTS regarding transportation referral . Assessed for patient understanding of how to access transportation services . Patient confirms she is enrolled and was educated on how to access benefit . Goal Met  Patient Self Care Activities:  . Currently UNABLE TO independently provide own transportation  Please see past updates related to this goal by clicking on the "Past Updates" button in the selected goal          Materials Provided: Verbal education about [place] [address] provided by phone  Follow Up Plan: SW will follow up with patient by phone over the next week.   Daneen Schick, BSW, CDP Social Worker, Certified Dementia Practitioner [place] [facility] [place] [facility] [phone]

## 2019-10-07 ENCOUNTER — Telehealth: Payer: Self-pay

## 2019-10-07 ENCOUNTER — Other Ambulatory Visit: Payer: Self-pay | Admitting: Nurse Practitioner

## 2019-10-07 DIAGNOSIS — Z9889 Other specified postprocedural states: Secondary | ICD-10-CM | POA: Diagnosis not present

## 2019-10-07 DIAGNOSIS — Z471 Aftercare following joint replacement surgery: Secondary | ICD-10-CM | POA: Diagnosis not present

## 2019-10-07 DIAGNOSIS — M25461 Effusion, right knee: Secondary | ICD-10-CM | POA: Diagnosis not present

## 2019-10-07 DIAGNOSIS — Z96651 Presence of right artificial knee joint: Secondary | ICD-10-CM | POA: Diagnosis not present

## 2019-10-07 MED ORDER — ATORVASTATIN CALCIUM 10 MG PO TABS: 10.0000 mg | ORAL_TABLET | Freq: Every day | ORAL | 0 refills | Status: DC

## 2019-10-07 NOTE — Telephone Encounter (Signed)
I called patient to see if she would be ok with taking a statin medication weekly to help reduce her risk of any cardiac diseases due to her having diabetes and hypertension pt stated she is ok with this so we have sent in a prescription for her. YRL,RMA

## 2019-10-08 ENCOUNTER — Other Ambulatory Visit: Payer: Self-pay | Admitting: Nurse Practitioner

## 2019-10-08 DIAGNOSIS — J3089 Other allergic rhinitis: Secondary | ICD-10-CM

## 2019-10-11 ENCOUNTER — Telehealth: Payer: Self-pay

## 2019-10-11 ENCOUNTER — Ambulatory Visit: Payer: Self-pay

## 2019-10-11 DIAGNOSIS — I10 Essential (primary) hypertension: Secondary | ICD-10-CM

## 2019-10-11 DIAGNOSIS — E1169 Type 2 diabetes mellitus with other specified complication: Secondary | ICD-10-CM

## 2019-10-11 NOTE — Chronic Care Management (AMB) (Signed)
  Chronic Care Management   Social Work Note  10/11/2019 Name: Christine Mcdowell MRN: [id number] DOB: 08/27/1971  SW placed an unsuccessful outbound call to the patient to assist with patient goal related to emergency housing. Unfortunately, the patient did not answer and does not have a voice mailbox set up.  Follow Up Plan: SW will follow up with patient by phone over the next week  Daneen Schick, BSW, CDP Social Worker, Certified Dementia Practitioner [place] [facility] [place] [facility] [phone]

## 2019-10-13 ENCOUNTER — Other Ambulatory Visit: Payer: Self-pay

## 2019-10-14 ENCOUNTER — Ambulatory Visit (INDEPENDENT_AMBULATORY_CARE_PROVIDER_SITE_OTHER): Payer: Medicare Other

## 2019-10-14 ENCOUNTER — Telehealth (HOSPITAL_COMMUNITY): Payer: Self-pay

## 2019-10-14 DIAGNOSIS — I5022 Chronic systolic (congestive) heart failure: Secondary | ICD-10-CM

## 2019-10-14 DIAGNOSIS — I10 Essential (primary) hypertension: Secondary | ICD-10-CM

## 2019-10-14 DIAGNOSIS — E1169 Type 2 diabetes mellitus with other specified complication: Secondary | ICD-10-CM | POA: Diagnosis not present

## 2019-10-14 NOTE — Telephone Encounter (Signed)
Pt called stating that she believes that she has fluid.  She does not know what her weight is nor does she know what it has been because her scale broke.  She has shortness of breath, her legs are swollen as well as her hands.  Her blood pressure is elevated 167/119 yesterday and 148/100 this morning.  She was seen by her lupus doctor and they prescribed her prednisone as she is having a lupus  flare.  They recommended she contact us to be seen.  Please advise.  Can she be added to NP schedule in the morning

## 2019-10-14 NOTE — Telephone Encounter (Signed)
Pt will come in tomorrow to see NP, pt appreciative

## 2019-10-15 ENCOUNTER — Encounter (HOSPITAL_COMMUNITY): Payer: Medicare Other

## 2019-10-15 ENCOUNTER — Emergency Department (HOSPITAL_COMMUNITY)
Admission: EM | Admit: 2019-10-15 | Discharge: 2019-10-15 | Disposition: A | Discharge status: Home / Self Care | Payer: Medicare Other | Attending: Emergency Medicine | Admitting: Emergency Medicine

## 2019-10-15 ENCOUNTER — Emergency Department (HOSPITAL_COMMUNITY): Payer: Medicare Other

## 2019-10-15 ENCOUNTER — Telehealth: Payer: Self-pay

## 2019-10-15 DIAGNOSIS — J45901 Unspecified asthma with (acute) exacerbation: Secondary | ICD-10-CM | POA: Insufficient documentation

## 2019-10-15 DIAGNOSIS — Z7982 Long term (current) use of aspirin: Secondary | ICD-10-CM | POA: Insufficient documentation

## 2019-10-15 DIAGNOSIS — E119 Type 2 diabetes mellitus without complications: Secondary | ICD-10-CM | POA: Insufficient documentation

## 2019-10-15 DIAGNOSIS — M069 Rheumatoid arthritis, unspecified: Secondary | ICD-10-CM | POA: Diagnosis not present

## 2019-10-15 DIAGNOSIS — I5022 Chronic systolic (congestive) heart failure: Secondary | ICD-10-CM | POA: Insufficient documentation

## 2019-10-15 DIAGNOSIS — Z91040 Latex allergy status: Secondary | ICD-10-CM | POA: Insufficient documentation

## 2019-10-15 DIAGNOSIS — R0602 Shortness of breath: Secondary | ICD-10-CM | POA: Diagnosis not present

## 2019-10-15 DIAGNOSIS — Z95810 Presence of automatic (implantable) cardiac defibrillator: Secondary | ICD-10-CM | POA: Insufficient documentation

## 2019-10-15 DIAGNOSIS — I110 Hypertensive heart disease with heart failure: Secondary | ICD-10-CM | POA: Diagnosis not present

## 2019-10-15 DIAGNOSIS — Z79899 Other long term (current) drug therapy: Secondary | ICD-10-CM | POA: Diagnosis not present

## 2019-10-15 LAB — CBC WITH DIFFERENTIAL/PLATELET
Abs Immature Granulocytes: 0.04 10*3/uL (ref 0.00–0.07)
Basophils Absolute: 0 10*3/uL (ref 0.0–0.1)
Basophils Relative: 0 %
Eosinophils Absolute: 0.2 10*3/uL (ref 0.0–0.5)
Eosinophils Relative: 2 %
HCT: 35.7 % — ABNORMAL LOW (ref 36.0–46.0)
Hemoglobin: 11.2 g/dL — ABNORMAL LOW (ref 12.0–15.0)
Immature Granulocytes: 1 %
Lymphocytes Relative: 26 %
Lymphs Abs: 1.7 10*3/uL (ref 0.7–4.0)
MCH: 26.4 pg (ref 26.0–34.0)
MCHC: 31.4 g/dL (ref 30.0–36.0)
MCV: 84.2 fL (ref 80.0–100.0)
Monocytes Absolute: 0.5 10*3/uL (ref 0.1–1.0)
Monocytes Relative: 8 %
Neutro Abs: 4.2 10*3/uL (ref 1.7–7.7)
Neutrophils Relative %: 63 %
Platelets: 233 10*3/uL (ref 150–400)
RBC: 4.24 MIL/uL (ref 3.87–5.11)
RDW: 17.3 % — ABNORMAL HIGH (ref 11.5–15.5)
WBC: 6.5 10*3/uL (ref 4.0–10.5)
nRBC: 0.3 % — ABNORMAL HIGH (ref 0.0–0.2)

## 2019-10-15 LAB — COMPREHENSIVE METABOLIC PANEL
ALT: 14 U/L (ref 0–44)
AST: 29 U/L (ref 15–41)
Albumin: 4 g/dL (ref 3.5–5.0)
Alkaline Phosphatase: 54 U/L (ref 38–126)
Anion gap: 12 (ref 5–15)
BUN: 18 mg/dL (ref 6–20)
CO2: 23 mmol/L (ref 22–32)
Calcium: 9 mg/dL (ref 8.9–10.3)
Chloride: 105 mmol/L (ref 98–111)
Creatinine, Ser: 0.82 mg/dL (ref 0.44–1.00)
GFR calc Af Amer: >60 mL/min (ref >60)
GFR calc non Af Amer: >60 mL/min (ref >60)
Glucose, Bld: 116 mg/dL — ABNORMAL HIGH (ref 70–99)
Potassium: 4.1 mmol/L (ref 3.5–5.1)
Sodium: 140 mmol/L (ref 135–145)
Total Bilirubin: 1 mg/dL (ref 0.3–1.2)
Total Protein: 6.4 g/dL — ABNORMAL LOW (ref 6.5–8.1)

## 2019-10-15 LAB — TROPONIN I (HIGH SENSITIVITY): Troponin I (High Sensitivity): 4 ng/L (ref <18)

## 2019-10-15 LAB — BRAIN NATRIURETIC PEPTIDE: B Natriuretic Peptide: 75.3 pg/mL (ref 0.0–100.0)

## 2019-10-15 MED ORDER — METHYLPREDNISOLONE SODIUM SUCC 125 MG IJ SOLR
125.0000 mg | Freq: Once | INTRAMUSCULAR | Status: AC
  Administered 2019-10-15: 125 mg via INTRAVENOUS
  Filled 2019-10-15: qty 2

## 2019-10-15 MED ORDER — PREDNISONE 20 MG PO TABS: 40.0000 mg | ORAL_TABLET | Freq: Every day | ORAL | 0 refills | Status: DC

## 2019-10-15 MED ORDER — ALBUTEROL SULFATE HFA 108 (90 BASE) MCG/ACT IN AERS
4.0000 | INHALATION_SPRAY | RESPIRATORY_TRACT | Status: DC | PRN
  Administered 2019-10-15: 4 via RESPIRATORY_TRACT
  Filled 2019-10-15: qty 6.7

## 2019-10-15 NOTE — ED Provider Notes (Signed)
[facility] Provider Note   CSN: [id number] Arrival date & time: 10/15/19  0240     History   Chief Complaint Chief Complaint  Patient presents with  . Shortness of Breath    HPI Christine Mcdowell is a 48 y.o. female.    Level 5 caveat due to dyspnea. HPI Patient presents with shortness of breath.  Worse this morning but has had some swelling in legs over the last couple days.  Difficulty speaking upon my exam he reported was driving to the doctor and was more short of breath.  Did not use her inhaler.  Denies chest pain. Past Medical History:  Diagnosis Date  . AICD (automatic cardioverter/defibrillator) present 01/28/2018  . Anemia   . Anginal pain ([place])   . Asthma   . Cervical cancer ([place])   . CHF (congestive heart failure) ([place])   . Diabetes mellitus without complication ([place])    steroid induced  . Discoid lupus   . Fibromyalgia   . History of blood transfusion "several"   "related to anemia; had some w/hysterectomy also"  . Hx of cardiovascular stress test    ETT-Myoview (9/15):  No ischemia, EF 52%; NORMAL  . Hx of echocardiogram    Echo (9/15):  EF 50-55%, ant HK, Gr 1 DD, mild MR, mild LAE, no effusion  . Hypertension   . Iron deficiency anemia    h/o iron transfusions  . Lupus (systemic lupus erythematosus) ([place])   . Migraine    "a few/year" (07/03/2016)  . Pneumonia 12/2015  . RA (rheumatoid arthritis) ([place])    "all over" (07/03/2016)  . Sickle cell trait ([place])   . Stroke ([place]) 2014 X 1; 2015 X 2; 2016 X 1;    "right side of face more relaxed than the other; rare speech hesitation" (07/03/2016)  . Vaginal Pap smear, abnormal    ASCUS; HPV    Patient Active Problem List   Diagnosis Date Noted  . Lower abdominal pain 09/07/2019  . Diarrhea 09/07/2019  . MDD (major depressive disorder), single episode, moderate ([place]) 06/23/2019  . Chronic systolic (congestive) heart failure ([place]) 01/28/2018  . Upper airway cough  syndrome 01/19/2018  . Pulmonary infiltrates on CXR 01/19/2018  . Migraines 10/08/2017  . Sleep apnea with use of continuous positive airway pressure (CPAP) 09/24/2017  . Congestive heart failure (CHF) ([place]) 10/09/2016  . Respiratory distress 10/09/2016  . Rheumatoid arthritis ([place]) 10/09/2016  . Asthma 10/09/2016  . Chronic pain 10/09/2016  . Heart failure ([place]) 10/09/2016  . Congestive heart failure ([place])   . Chest pain 07/03/2016  . Lupus (systemic lupus erythematosus) ([place])   . Diabetes mellitus with complication ([place])   . Anxiety state   . GERD (gastroesophageal reflux disease) 12/28/2015  . Essential hypertension 12/26/2015  . Type 2 diabetes mellitus ([place]) 12/26/2015  . Chronic systolic heart failure ([place]) 08/18/2014  . Cardiomyopathy, dilated ([place]) 01/27/2014  . Hypokalemia 01/27/2014  . Shortness of breath 01/26/2014  . SLE (systemic lupus erythematosus) ([place]) 01/26/2014    Past Surgical History:  Procedure Laterality Date  . ABDOMINAL HYSTERECTOMY  2009  . ABDOMINAL WOUND DEHISCENCE  2009  . CARDIAC CATHETERIZATION N/A 10/11/2016   Procedure: Right/Left Heart Cath and Coronary Angiography;  Surgeon: Jolaine Artist, MD;  Location: [facility];  Service: Cardiovascular;  Laterality: N/A;  . DILATION AND CURETTAGE OF UTERUS  1991  . HEMATOMA EVACUATION  2009   abdomen  . ICD IMPLANT  01/28/2018  .  ICD IMPLANT N/A 01/28/2018   Procedure: ICD IMPLANT;  Surgeon: Evans Lance, MD;  Location: [facility];  Service: Cardiovascular;  Laterality: N/A;  . INCISE AND DRAIN ABCESS  2009 X 2   "abdomen after hysterectomy"  . KNEE ARTHROSCOPY Right 1997  . KNEE SURGERY Right   . TUBAL LIGATION  1996     OB History    Gravida  5   Para  4   Term  3   Preterm  1   AB  1   Living  4     SAB  1   TAB      Ectopic      Multiple      Live Births  4            Home Medications    Prior to Admission medications   Medication Sig Start  Date End Date Taking? Authorizing Provider  Abatacept (ORENCIA) 125 MG/ML SOSY Inject into the skin once a week.    [provider]  ACCU-CHEK FASTCLIX LANCETS MISC USE TO PRICK FINGER TO TEST BLOOD SUGAR BEFORE BREAKFAST AND DINNER 08/31/18   [provider]  albuterol (ACCUNEB) 1.25 MG/3ML nebulizer solution Take 3 mLs (1.25 mg total) by nebulization every 6 (six) hours as needed for wheezing. 03/04/16   Lacretia Leigh, MD  aspirin 81 MG EC tablet Take by mouth daily. Taking 2 tablets    [provider]  atorvastatin (LIPITOR) 10 MG tablet TAKE 1 TABLET(10 MG) BY MOUTH DAILY 10/07/19   [name], FNP  Bacillus Coagulans-Inulin (PROBIOTIC) 1-250 BILLION-MG CAPS Take 1 capsule by mouth daily. 09/07/19   [name], FNP  blood glucose meter kit and supplies KIT Dispense based on patient and insurance preference. Use up to four times daily as directed. (FOR ICD-9 250.00, 250.01). FILL BASED ON INSURANCE PREFERENCE. 08/18/19   [name], FNP  Blood Glucose Monitoring Suppl (ACCU-CHEK AVIVA PLUS) w/Device KIT DX: E11.9. Use as directed to test blood sugar 09/08/19   [name], FNP  carvedilol (COREG) 25 MG tablet TAKE 1 TABLET(25 MG) BY MOUTH TWICE DAILY WITH A MEAL 06/07/19   Bensimhon, Shaune Pascal, MD  ciprofloxacin (CIPRO) 500 MG tablet Take 1 tablet (500 mg total) by mouth 2 (two) times daily. One po bid x 7 days 09/11/19   Molpus, John, MD  citalopram (CELEXA) 10 MG tablet TAKE 1 TABLET BY MOUTH EVERY DAY 10/11/19   [name], FNP  Continuous Blood Gluc Receiver (DEXCOM G6 RECEIVER) DEVI 1 Device by Does not apply route 3 (three) times daily before meals. 07/29/19   [name], FNP  Continuous Blood Gluc Sensor (DEXCOM G6 SENSOR) MISC Inject 1 each into the skin 3 (three) times daily. 07/26/19   [name], MD  Continuous Blood Gluc Transmit (DEXCOM G6 TRANSMITTER) MISC 1 Device by Does not apply route 3 (three) times daily before meals. 07/26/19   [name],  MD  diclofenac sodium (VOLTAREN) 1 % GEL Apply 1 g topically as needed.    [provider]  dicyclomine (BENTYL) 20 MG tablet Take 1 tablet (20 mg total) by mouth 3 (three) times daily as needed for spasms. 09/07/19 09/06/20  [name], FNP  folic acid (FOLVITE) 1 MG tablet TAKE 1 TABLET BY MOUTH EVERY MORNING Patient taking differently: Take 1 mg by mouth daily.  01/30/17   Bensimhon, Shaune Pascal, MD  glucose blood (ACCU-CHEK GUIDE) test strip DX: E11.9 Use as instructed to test blood sugars  3-4 times daily. 09/27/19   [name], MD  hydrALAZINE (APRESOLINE) 50 MG tablet Take 1 tablet (50 mg total) by mouth 3 (three) times daily. 09/27/19   Bensimhon, Shaune Pascal, MD  HYDROcodone-acetaminophen (NORCO) 5-325 MG tablet Take 1 tablet by mouth every 4 (four) hours as needed (for pain). 09/11/19   Molpus, John, MD  hydroxychloroquine (PLAQUENIL) 200 MG tablet Take 1 tablet (200 mg total) by mouth 2 (two) times daily. 02/02/19   Bensimhon, Shaune Pascal, MD  ipratropium (ATROVENT) 0.03 % nasal spray USE 2 SPRAYS IN EACH NOSTRIL THREE TIMES DAILY AS NEEDED FOR RHINITIS 10/11/19   [name], FNP  Magnesium 200 MG TABS Take 1 tablet by mouth daily.    [provider]  meloxicam (MOBIC) 15 MG tablet Take 15 mg by mouth daily.     [provider]  metFORMIN (GLUCOPHAGE) 500 MG tablet TAKE 1 TABLET BY MOUTH EVERY DAY WITH THE MORNING AND EVENING MEAL 09/27/19   [name], FNP  methotrexate (RHEUMATREX) 2.5 MG tablet Take 20 mg by mouth every Friday.  02/06/15   [provider]  metolazone (ZAROXOLYN) 2.5 MG tablet TAKE 1 TABLET BY MOUTH AS NEEDED FOR WEIGHT GAIN OF 5 POUNDS WITHIN 3 DAYS AS DIRECTED 09/14/19   Bensimhon, Shaune Pascal, MD  mometasone (NASONEX) 50 MCG/ACT nasal spray Place 2 sprays into the nose daily. 08/24/19 08/23/20  [name], FNP  montelukast (SINGULAIR) 10 MG tablet TAKE 1 TABLET BY MOUTH DAILY 06/07/19   [name], FNP  Multiple Vitamin (MULTIVITAMIN WITH  MINERALS) TABS Take 1 tablet by mouth every morning.     [provider]  NOVOLOG FLEXPEN 100 UNIT/ML FlexPen INJECT UP TO 17 UNITS THREE TIMES DAILY BY SLIDING SCALE AS DIRECTED 09/27/19   [name], FNP  ondansetron (ZOFRAN) 4 MG tablet Take 1 tablet (4 mg total) by mouth every 8 (eight) hours as needed for nausea or vomiting. 12/30/15   Robbie Lis, MD  predniSONE (DELTASONE) 20 MG tablet Take 2 tablets (40 mg total) by mouth daily. 10/16/19   Davonna Belling, MD  PROAIR HFA 108 [phone] Base) MCG/ACT inhaler INHALE 2 PUFFS BY MOUTH EVERY DAY AS NEEDED FOR SHORTNESS OF BREATH 09/14/19   Tanda Rockers, MD  sacubitril-valsartan (ENTRESTO) 97-103 MG Take 1 tablet by mouth 2 (two) times daily. 08/04/19   Bensimhon, Shaune Pascal, MD  spironolactone (ALDACTONE) 25 MG tablet TAKE 1 TABLET(25 MG) BY MOUTH TWICE DAILY 09/24/19   Bensimhon, Shaune Pascal, MD  torsemide [facility]) 20 MG tablet Take 1 tablet Monday, Wednesday , and Friday 08/04/19   Bensimhon, Shaune Pascal, MD  traMADol (ULTRAM) 50 MG tablet Take 1 tablet (50 mg total) by mouth every 6 (six) hours as needed. 06/23/19 06/22/20  [name], FNP  traZODone (DESYREL) 50 MG tablet Take 50 mg by mouth at bedtime.    [provider]  TRESIBA FLEXTOUCH 100 UNIT/ML SOPN FlexTouch Pen INJECT 12 UNIT INTO THE SKIN EVERY NIGHT AT BEDTIME PER INSULIN PROTOCOL Patient taking differently: Inject 12 Units into the skin.  06/30/19   [name], FNP  Vitamin D, Ergocalciferol, (DRISDOL) 1.25 MG (50000 UT) CAPS capsule TAKE 1 CAPSULE BY MOUTH 2 TIMES EVERY WEEK 09/27/19   [name], FNP    Family History Family History  Problem Relation Age of Onset  . Arthritis Mother   . Heart murmur Mother   . Drug abuse Mother   . Allergies Mother   . Heart attack Father   .  Cushing syndrome Father   . Depression Father   . Allergies Father   . Dementia Paternal Grandmother   . Cancer Paternal Grandfather   . Diabetes Maternal Grandmother   . Hypertension  Maternal Grandmother   . Asthma Maternal Grandmother   . Heart attack Maternal Grandfather     Social History Social History   Tobacco Use  . Smoking status: Never Smoker  . Smokeless tobacco: Never Used  Substance Use Topics  . Alcohol use: No  . Drug use: No     Allergies   Hydromorphone, Iodinated diagnostic agents, Other, Erythromycin, Latex, Tape, and Mircette [desogestrel-ethinyl estradiol]   Review of Systems Review of Systems  Unable to perform ROS: Severe respiratory distress  Respiratory: Positive for shortness of breath.      Physical Exam Updated Vital Signs BP (!) 159/91   Pulse 76   Temp 98 F (36.7 C) (Oral)   Resp 16   SpO2 99%   Physical Exam Vitals signs and nursing note reviewed.  HENT:     Head: Normocephalic.  Cardiovascular:     Rate and Rhythm: Regular rhythm.  Pulmonary:     Comments: Tachypnea.  Potential respiratory distress.  Sats of 91%.  However lungs do not have much wheezing.  Potential upper airway sounds. Chest:     Chest wall: No tenderness.  Musculoskeletal:     Right lower leg: Edema present.     Left lower leg: Edema present.     Comments: Mild pitting edema bilateral lower extremities.  Skin:    General: Skin is warm.     Capillary Refill: Capillary refill takes less than 2 seconds.  Neurological:     Mental Status: She is alert.  Psychiatric:     Comments: Patient appears somewhat anxious.      ED Treatments / Results  Labs (all labs ordered are listed, but only abnormal results are displayed) Labs Reviewed  COMPREHENSIVE METABOLIC PANEL - Abnormal; Notable for the following components:      Result Value   Glucose, Bld 116 (*)    Total Protein 6.4 (*)    All other components within normal limits  CBC WITH DIFFERENTIAL/PLATELET - Abnormal; Notable for the following components:   Hemoglobin 11.2 (*)    HCT 35.7 (*)    RDW 17.3 (*)    nRBC 0.3 (*)    All other components within normal limits  BRAIN  NATRIURETIC PEPTIDE  TROPONIN I (HIGH SENSITIVITY)    EKG EKG Interpretation  Date/Time:  Friday October 15 2019 10:02:36 EDT Ventricular Rate:  81 PR Interval:  208 QRS Duration: 86 QT Interval:  402 QTC Calculation: 466 R Axis:   36 Text Interpretation:  Normal sinus rhythm Septal infarct , age undetermined Abnormal ECG Confirmed by Davonna Belling [id number]) on 10/15/2019 11:04:31 AM   Radiology Dg Chest Portable 1 View  Result Date: 10/15/2019 CLINICAL DATA:  Short of breath.  History of asthma. EXAM: PORTABLE CHEST 1 VIEW COMPARISON:  08/14/2019 and older exams. FINDINGS: Cardiac silhouette is mildly enlarged. Left anterior chest wall single lead AICD is stable. No mediastinal or hilar masses. No evidence of adenopathy. Clear lungs.  No pleural effusion or pneumothorax. Skeletal structures are grossly intact. IMPRESSION: 1. No acute cardiopulmonary disease. 2. Mild cardiomegaly. Electronically Signed   By: Lajean Manes M.D.   On: 10/15/2019 10:44    Procedures Procedures (including critical care time)  Medications Ordered in ED Medications  albuterol (VENTOLIN HFA) 108 (90 Base) MCG/ACT inhaler  4 puff (4 puffs Inhalation Given 10/15/19 1032)  methylPREDNISolone sodium succinate (SOLU-MEDROL) 125 mg/2 mL injection 125 mg (125 mg Intravenous Given 10/15/19 1032)     Initial Impression / Assessment and Plan / ED Course  I have reviewed the triage vital signs and the nursing notes.  Pertinent labs & imaging results that were available during my care of the patient were reviewed by me and considered in my medical decision making (see chart for details).        Patient with shortness of breath.  Likely asthma exacerbation.  May have been anxiety component to.  Patient feels much better.  No CHF on x-ray and BNP is normal.  Discharge home  Final Clinical Impressions(s) / ED Diagnoses   Final diagnoses:  Exacerbation of asthma, unspecified asthma severity, unspecified  whether persistent    ED Discharge Orders         Ordered    predniSONE (DELTASONE) 20 MG tablet  Daily     10/15/19 1301           Davonna Belling, MD 10/15/19 1304

## 2019-10-15 NOTE — ED Notes (Signed)
Patient verbalizes understanding of discharge instructions. Opportunity for questioning and answers were provided. Armband removed by staff, pt discharged from ED.  

## 2019-10-15 NOTE — ED Triage Notes (Signed)
Pt states she woke up with sob this am, was on her way to her doctor and became even more sob, hx of asthma, did not have a chance to use her inhaler. resp very labored with stridor.

## 2019-10-18 ENCOUNTER — Ambulatory Visit (INDEPENDENT_AMBULATORY_CARE_PROVIDER_SITE_OTHER): Payer: Medicare Other

## 2019-10-18 DIAGNOSIS — I5022 Chronic systolic (congestive) heart failure: Secondary | ICD-10-CM

## 2019-10-18 DIAGNOSIS — Z95810 Presence of automatic (implantable) cardiac defibrillator: Secondary | ICD-10-CM | POA: Diagnosis not present

## 2019-10-19 ENCOUNTER — Encounter: Payer: Self-pay | Admitting: Nurse Practitioner

## 2019-10-19 ENCOUNTER — Telehealth: Payer: Self-pay

## 2019-10-19 ENCOUNTER — Telehealth (INDEPENDENT_AMBULATORY_CARE_PROVIDER_SITE_OTHER): Payer: Medicare Other | Admitting: Nurse Practitioner

## 2019-10-19 ENCOUNTER — Other Ambulatory Visit: Payer: Self-pay

## 2019-10-19 ENCOUNTER — Ambulatory Visit: Payer: Self-pay

## 2019-10-19 DIAGNOSIS — J3089 Other allergic rhinitis: Secondary | ICD-10-CM

## 2019-10-19 DIAGNOSIS — E1169 Type 2 diabetes mellitus with other specified complication: Secondary | ICD-10-CM

## 2019-10-19 DIAGNOSIS — J4520 Mild intermittent asthma, uncomplicated: Secondary | ICD-10-CM

## 2019-10-19 MED ORDER — AZELASTINE HCL 137 MCG/SPRAY NA SOLN: 2.0000 | Freq: Every day | NASAL | 3 refills | Status: DC

## 2019-10-19 NOTE — Progress Notes (Addendum)
Virtual Visit via Video   This visit type was conducted due to national recommendations for restrictions regarding the COVID-19 Pandemic (e.g. social distancing) in an effort to limit this patient's exposure and mitigate transmission in our community.  Due to her co-morbid illnesses, this patient is at least at moderate risk for complications without adequate follow up.  This format is felt to be most appropriate for this patient at this time.  All issues noted in this document were discussed and addressed.  A limited physical exam was performed with this format.    This visit type was conducted due to national recommendations for restrictions regarding the COVID-19 Pandemic (e.g. social distancing) in an effort to limit this patient's exposure and mitigate transmission in our community.  Patients identity confirmed using two different identifiers.  This format is felt to be most appropriate for this patient at this time.  All issues noted in this document were discussed and addressed.  No physical exam was performed (except for noted visual exam findings with Video Visits).    Date:  10/19/2019   ID:  Christine Mcdowell, DOB Aug 23, 1971, MRN [id number]  Patient Location:  Car passenger- spoke with Christine Mcdowell  Provider location:   Office    Chief Complaint:  Follow up ER for asthma exacerbation  History of Present Illness:    Christine Mcdowell is a 48 y.o. female who presents via video conferencing for a telehealth visit today.    The patient does not have symptoms concerning for COVID-19 infection (fever, chills, cough, or new shortness of breath).   She was on her way to see Dr. Tempie Mcdowell due to shortness of breath, she had been tired.  By the time she got out of car and use a wheelchair to go to his office.  By the time she got from [place] to [facility] she was having trouble breathing.  She did not have her inhaler with her.    When seen in the ER she was found to have an  asthma flare vs CHF.  She will have an intermittent cough.  She has watery eyes, itchy eyes and allergy related symptoms.  She has recently had a lupus flare and allergies.  She has not seen Dr. Melvyn Mcdowell since being removed from [facility] however Dr. Tempie Mcdowell did not want her to stop this medication.    Asthma She complains of difficulty breathing and shortness of breath. There is no cough. Primary symptoms comments: When she went to the ER on 10/23. The current episode started in the past 7 days. Pertinent negatives include no appetite change. Her symptoms are not alleviated by ipratropium and oral steroids. Her past medical history is significant for asthma.     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   Anemia    Anginal pain (HCC)    Asthma    Cervical cancer (HCC)    CHF (congestive heart failure) ([place])    Diabetes mellitus without complication ([place])    steroid induced   Discoid lupus    Fibromyalgia    History of blood transfusion "several"   "related to anemia; had some w/hysterectomy also"   Hx of cardiovascular stress test    ETT-Myoview (9/15):  No ischemia, EF 52%; NORMAL   Hx of echocardiogram    Echo (9/15):  EF 50-55%, ant HK, Gr 1 DD, mild MR, mild LAE, no effusion   Hypertension    Iron deficiency anemia    h/o  iron transfusions   Lupus (systemic lupus erythematosus) ([place])    Migraine    "a few/year" (07/03/2016)   Pneumonia 12/2015   RA (rheumatoid arthritis) ([place])    "all over" (07/03/2016)   Sickle cell trait ([place])    Stroke ([place]) 2014 X 1; 2015 X 2; 2016 X 1;    "right side of face more relaxed than the other; rare speech hesitation" (07/03/2016)   Vaginal Pap smear, abnormal    ASCUS; HPV   Past Surgical History:  Procedure Laterality Date   ABDOMINAL HYSTERECTOMY  2009   ABDOMINAL WOUND DEHISCENCE  2009   CARDIAC CATHETERIZATION N/A 10/11/2016   Procedure: Right/Left Heart  Cath and Coronary Angiography;  Surgeon: Jolaine Artist, MD;  Location: [facility];  Service: Cardiovascular;  Laterality: N/A;   DILATION AND CURETTAGE OF UTERUS  1991   HEMATOMA EVACUATION  2009   abdomen   ICD IMPLANT  01/28/2018   ICD IMPLANT N/A 01/28/2018   Procedure: ICD IMPLANT;  Surgeon: Evans Lance, MD;  Location: [facility];  Service: Cardiovascular;  Laterality: N/A;   INCISE AND DRAIN ABCESS  2009 X 2   "abdomen after hysterectomy"   KNEE ARTHROSCOPY Right 1997   KNEE SURGERY Right    TUBAL LIGATION  1996     Current Meds  Medication Sig   Abatacept (ORENCIA) 125 MG/ML SOSY Inject into the skin once a week.   ACCU-CHEK FASTCLIX LANCETS MISC USE TO PRICK FINGER TO TEST BLOOD SUGAR BEFORE BREAKFAST AND DINNER   albuterol (ACCUNEB) 1.25 MG/3ML nebulizer solution Take 3 mLs (1.25 mg total) by nebulization every 6 (six) hours as needed for wheezing.   aspirin 81 MG EC tablet Take by mouth daily. Taking 2 tablets   atorvastatin (LIPITOR) 10 MG tablet TAKE 1 TABLET(10 MG) BY MOUTH DAILY   Bacillus Coagulans-Inulin (PROBIOTIC) 1-250 BILLION-MG CAPS Take 1 capsule by mouth daily.   blood glucose meter kit and supplies KIT Dispense based on patient and insurance preference. Use up to four times daily as directed. (FOR ICD-9 250.00, 250.01). FILL BASED ON INSURANCE PREFERENCE.   Blood Glucose Monitoring Suppl (ACCU-CHEK AVIVA PLUS) w/Device KIT DX: E11.9. Use as directed to test blood sugar   carvedilol (COREG) 25 MG tablet TAKE 1 TABLET(25 MG) BY MOUTH TWICE DAILY WITH A MEAL   ciprofloxacin (CIPRO) 500 MG tablet Take 1 tablet (500 mg total) by mouth 2 (two) times daily. One po bid x 7 days     Allergies:   Hydromorphone, Iodinated diagnostic agents, Other, Erythromycin, Latex, Tape, and Mircette [desogestrel-ethinyl estradiol]   Social History   Tobacco Use   Smoking status: Never Smoker   Smokeless tobacco: Never Used  Substance Use  Topics   Alcohol use: No   Drug use: No     Family Hx: The patient's family history includes Allergies in her father and mother; Arthritis in her mother; Asthma in her maternal grandmother; Cancer in her paternal grandfather; Cushing syndrome in her father; Dementia in her paternal grandmother; Depression in her father; Diabetes in her maternal grandmother; Drug abuse in her mother; Heart attack in her father and maternal grandfather; Heart murmur in her mother; Hypertension in her maternal grandmother.  ROS:   Please see the history of present illness.    Review of Systems  Constitutional: Negative for appetite change.  Respiratory: Positive for shortness of breath. Negative for cough.     All other systems reviewed and are negative.  Labs/Other Tests and Data Reviewed:    Recent Labs: 03/29/2019: TSH 0.750 10/15/2019: ALT 14; B Natriuretic Peptide 75.3; BUN 18; Creatinine, Ser 0.82; Hemoglobin 11.2; Platelets 233; Potassium 4.1; Sodium 140   Recent Lipid Panel Lab Results  Component Value Date/Time   CHOL 216 (H) 03/29/2019 03:46 PM   TRIG 252 (H) 03/29/2019 03:46 PM   HDL 40 03/29/2019 03:46 PM   CHOLHDL 5.4 (H) 03/29/2019 03:46 PM   LDLCALC 126 (H) 03/29/2019 03:46 PM    Wt Readings from Last 3 Encounters:  09/07/19 191 lb (86.6 kg)  08/24/19 184 lb 9.6 oz (83.7 kg)  08/14/19 192 lb (87.1 kg)     Exam:    Vital Signs:  There were no vitals taken for this visit.    Physical Exam  Constitutional: She is oriented to person, place, and time and well-developed, well-nourished, and in no distress. No distress.  Pulmonary/Chest: Effort normal. No respiratory distress.  Neurological: She is alert and oriented to person, place, and time.  Psychiatric: Mood, memory, affect and judgment normal.    ASSESSMENT & PLAN:    1. Allergic rhinitis due to other allergic trigger, unspecified seasonality  She is to use azelastine with ipratroprium  She has tried multiple  allergy medications without relief  She has also seen an allergist with no findings of allergies environmental - Azelastine HCl 137 MCG/SPRAY SOLN; Place 2 sprays into the nose daily.  Dispense: 30 mL; Refill: 3 - Ambulatory referral to Pulmonology  2. Intermittent asthma without complication, unspecified asthma severity  She has seen Dr. Melvyn Mcdowell in the past but would like to see a different pulmonologist.  She is using albuterol inhaler as needed  She is also using ipratropium nasal spray daily with minimal relief for her allergies.  I have advised her to avoid being in crowds and to only go out when necessary due to her high risk for coronavirus. - Ambulatory referral to Pulmonology  3. Type 2 diabetes mellitus with other specified complication, without long-term current use of insulin (HCC)  Currently continues to be controlled while on steroids   COVID-19 Education: The signs and symptoms of COVID-19 were discussed with the patient and how to seek care for testing (follow up with PCP or arrange E-visit).  The importance of social distancing was discussed today.  Patient Risk:   After full review of this patients clinical status, I feel that they are at least moderate risk at this time.  Time:   Today, I have spent 20 minutes/ seconds with the patient with telehealth technology discussing above diagnoses.     Medication Adjustments/Labs and Tests Ordered: Current medicines are reviewed at length with the patient today.  Concerns regarding medicines are outlined above.   Tests Ordered: Orders Placed This Encounter  Procedures   Ambulatory referral to Pulmonology    Medication Changes: Meds ordered this encounter  Medications   Azelastine HCl 137 MCG/SPRAY SOLN    Sig: Place 2 sprays into the nose daily.    Dispense:  30 mL    Refill:  3    Disposition:  Follow up prn  Signed, [name], FNP

## 2019-10-19 NOTE — Chronic Care Management (AMB) (Signed)
  Chronic Care Management   Social Work Note  10/19/2019 Name: Christine Mcdowell MRN: [id number] DOB: 10/07/1971  Christine Mcdowell is a 48 y.o. year old female who sees [name], MD for primary care. The CCM team was consulted for assistance with [employer] .   SW placed an unsuccessful outbound call to the patient to assess progression of housing goal. SW unable to leave a message due to patient not having a voice mailbox established at this time.   Follow Up Plan: SW will follow up with patient by phone over the next 10 days  Daneen Schick, BSW, CDP Social Worker, Certified Dementia Practitioner [place] [place] [facility] [place] [facility] [phone]

## 2019-10-20 ENCOUNTER — Telehealth: Payer: Self-pay

## 2019-10-20 ENCOUNTER — Ambulatory Visit: Payer: Medicare Other

## 2019-10-20 NOTE — Chronic Care Management (AMB) (Signed)
Chronic Care Management   Follow Up Note   10/14/2019 Name: Christine Mcdowell MRN: [id number] DOB: [birth year]  Referred by: [name], MD Reason for referral : Chronic Care Management (INITIAL CCM RN CM Telephone Outreach )   Christine Mcdowell is a 48 y.o. year old female who is a primary care patient of [name], MD. The CCM team was consulted for assistance with chronic disease management and care coordination needs.    Review of patient status, including review of consultants reports, relevant laboratory and other test results, and collaboration with appropriate care team members and the patient's provider was performed as part of comprehensive patient evaluation and provision of chronic care management services.    SDOH (Social Determinants of Health) screening performed today: Housing . See Care Plan for related entries.   Advanced Directives Status: N See Care Plan and [facility] application for related entries.  I spoke with Christine Mcdowell by telephone today for a CCM follow up.   Outpatient Encounter Medications as of 10/14/2019  Medication Sig  . Abatacept (ORENCIA) 125 MG/ML SOSY Inject into the skin once a week.  Marland Kitchen ACCU-CHEK FASTCLIX LANCETS MISC USE TO PRICK FINGER TO TEST BLOOD SUGAR BEFORE BREAKFAST AND DINNER  . albuterol (ACCUNEB) 1.25 MG/3ML nebulizer solution Take 3 mLs (1.25 mg total) by nebulization every 6 (six) hours as needed for wheezing.  Marland Kitchen aspirin 81 MG EC tablet Take by mouth daily. Taking 2 tablets  . atorvastatin (LIPITOR) 10 MG tablet TAKE 1 TABLET(10 MG) BY MOUTH DAILY  . Bacillus Coagulans-Inulin (PROBIOTIC) 1-250 BILLION-MG CAPS Take 1 capsule by mouth daily.  . blood glucose meter kit and supplies KIT Dispense based on patient and insurance preference. Use up to four times daily as directed. (FOR ICD-9 250.00, 250.01). FILL BASED ON INSURANCE PREFERENCE.  Marland Kitchen Blood Glucose Monitoring Suppl (ACCU-CHEK AVIVA PLUS) w/Device KIT DX: E11.9. Use as  directed to test blood sugar  . carvedilol (COREG) 25 MG tablet TAKE 1 TABLET(25 MG) BY MOUTH TWICE DAILY WITH A MEAL  . ciprofloxacin (CIPRO) 500 MG tablet Take 1 tablet (500 mg total) by mouth 2 (two) times daily. One po bid x 7 days  . citalopram (CELEXA) 10 MG tablet TAKE 1 TABLET BY MOUTH EVERY DAY  . Continuous Blood Gluc Receiver (DEXCOM G6 RECEIVER) DEVI 1 Device by Does not apply route 3 (three) times daily before meals.  . Continuous Blood Gluc Sensor (DEXCOM G6 SENSOR) MISC Inject 1 each into the skin 3 (three) times daily.  . Continuous Blood Gluc Transmit (DEXCOM G6 TRANSMITTER) MISC 1 Device by Does not apply route 3 (three) times daily before meals.  . diclofenac sodium (VOLTAREN) 1 % GEL Apply 1 g topically as needed.  . dicyclomine (BENTYL) 20 MG tablet Take 1 tablet (20 mg total) by mouth 3 (three) times daily as needed for spasms.  . folic acid (FOLVITE) 1 MG tablet TAKE 1 TABLET BY MOUTH EVERY MORNING (Patient taking differently: Take 1 mg by mouth daily. )  . glucose blood (ACCU-CHEK GUIDE) test strip DX: E11.9 Use as instructed to test blood sugars 3-4 times daily.  . hydrALAZINE (APRESOLINE) 50 MG tablet Take 1 tablet (50 mg total) by mouth 3 (three) times daily.  Marland Kitchen HYDROcodone-acetaminophen (NORCO) 5-325 MG tablet Take 1 tablet by mouth every 4 (four) hours as needed (for pain).  . hydroxychloroquine (PLAQUENIL) 200 MG tablet Take 1 tablet (200 mg total) by mouth 2 (two) times daily.  Marland Kitchen ipratropium (ATROVENT) 0.03 % nasal spray  USE 2 SPRAYS IN EACH NOSTRIL THREE TIMES DAILY AS NEEDED FOR RHINITIS  . Magnesium 200 MG TABS Take 1 tablet by mouth daily.  . meloxicam (MOBIC) 15 MG tablet Take 15 mg by mouth daily.   . metFORMIN (GLUCOPHAGE) 500 MG tablet TAKE 1 TABLET BY MOUTH EVERY DAY WITH THE MORNING AND EVENING MEAL  . methotrexate (RHEUMATREX) 2.5 MG tablet Take 20 mg by mouth every Friday.   . metolazone (ZAROXOLYN) 2.5 MG tablet TAKE 1 TABLET BY MOUTH AS NEEDED FOR  WEIGHT GAIN OF 5 POUNDS WITHIN 3 DAYS AS DIRECTED  . mometasone (NASONEX) 50 MCG/ACT nasal spray Place 2 sprays into the nose daily.  . montelukast (SINGULAIR) 10 MG tablet TAKE 1 TABLET BY MOUTH DAILY  . Multiple Vitamin (MULTIVITAMIN WITH MINERALS) TABS Take 1 tablet by mouth every morning.   Marland Kitchen NOVOLOG FLEXPEN 100 UNIT/ML FlexPen INJECT UP TO 17 UNITS THREE TIMES DAILY BY SLIDING SCALE AS DIRECTED  . ondansetron (ZOFRAN) 4 MG tablet Take 1 tablet (4 mg total) by mouth every 8 (eight) hours as needed for nausea or vomiting.  Marland Kitchen PROAIR HFA 108 (90 Base) MCG/ACT inhaler INHALE 2 PUFFS BY MOUTH EVERY DAY AS NEEDED FOR SHORTNESS OF BREATH  . sacubitril-valsartan (ENTRESTO) 97-103 MG Take 1 tablet by mouth 2 (two) times daily.  Marland Kitchen spironolactone (ALDACTONE) 25 MG tablet TAKE 1 TABLET(25 MG) BY MOUTH TWICE DAILY  . torsemide (DEMADEX) 20 MG tablet Take 1 tablet Monday, Wednesday , and Friday  . traMADol (ULTRAM) 50 MG tablet Take 1 tablet (50 mg total) by mouth every 6 (six) hours as needed.  . traZODone (DESYREL) 50 MG tablet Take 50 mg by mouth at bedtime.  . TRESIBA FLEXTOUCH 100 UNIT/ML SOPN FlexTouch Pen INJECT 12 UNIT INTO THE SKIN EVERY NIGHT AT BEDTIME PER INSULIN PROTOCOL (Patient taking differently: Inject 12 Units into the skin. )  . Vitamin D, Ergocalciferol, (DRISDOL) 1.25 MG (50000 UT) CAPS capsule TAKE 1 CAPSULE BY MOUTH 2 TIMES EVERY WEEK  . [DISCONTINUED] predniSONE (DELTASONE) 20 MG tablet Take 40 mg by mouth daily with breakfast.    No facility-administered encounter medications on file as of 10/14/2019.      Goals Addressed      Patient Stated   . "I am having a Lupus flare and may be trying to have CHF" (pt-stated)       Current Barriers:  Marland Kitchen Knowledge Deficits related to diagnosis and treatment of CHF . Knowledge Deficits related to diagnosis and treatment of Lupus flare  Nurse Case Manager Clinical Goal(s):  Marland Kitchen Over the next 14 days, patient will work with PCP and or  Cardiology to address needs related to symptoms suggestive of Lupus and or CHF  . Over the next 30 days, patient will keep all MD follow up appointments, she will adhere to taking all prescribed medications and follow all MD recommendations for symptoms management of CHF and Lupus  CCM RN CM Interventions:  10/14/19 call completed with patient   . Discussed patient has not been able to weigh herself at home, she feels as if she is having a Lupus flare with increased inflammation and fatigue; patient states she may also be trying to have a CHF exacerbation and is experiencing mild shortness of breath and chest discomfort . Discussed patient has placed a call to her Cardiologist and is requesting to be seen, she is waiting on a return call . Evaluation of current treatment plan related to symptom management of CHF and  Lupus and patient's adherence to plan as established by provider. . Advised patient to contact her Cardiologist to report s/s suggestive of CHF; instructed patient to contact the [place] office if unable to be seen by the Cardiologist today, patient verbalizes understanding and is agreeable  . Reviewed medications with patient and discussed patient has recently completed a steroid dose pack but was prescribed to repeat a second dose pack; patient instructed to discuss this with her Cardiologist before starting; educated patient about the importance of closely monitoring her BP due to steroids can increase her pressure; patient reports BP today is 148/101 . Collaborated with provider [name], FNP regarding patient's c/o shortness of breath, increased edema, fatigue and chest discomfort; advised patient's BP today is 148/101; advised she is waiting to hear back from the Cardiologist in hopes to be seen  . Discussed plans with patient for ongoing care management follow up and provided patient with direct contact information for care management team  Patient Self Care Activities:  . Self  administers medications as prescribed . Calls provider office for new concerns or questions  Initial goal documentation     . "I need somewhere to stay" (pt-stated)       Current Barriers:  . Financial constraints related to long wait period for disability approval  Clinical Social Work Clinical Goal(s):  Marland Kitchen Over the next 45 days the patient will become more knowledgeable of low income housing options within [place] Interventions: Completed 10/06/2019 . Outbound call to the patient to assess progression of patient stated goal . Confirmed the patient has received resources regarding housing options . Informed by the patient she would need a place to live by Wednesday October 21 due to [place] home visit assessment scheduled by DSS "my grand-daughter has to be in her room" . Discussed challenges in locating housing in such a short time- the patient is not interested in accessing a shelter at this time. The patient is unable to identify family or friends she would be able to stay with . Assessed for patient financial ability to move into an apartment at this time - the patient identifies some savings but not enough to cover first month rent and deposit for most apartments . Encouraged the patient to contact low income housing options provided by SW in case there may be an opening . Referred patient to [place] via [place] in hopes of locating housing resources to assist with patient needs  CCM RN CM Interventions:  10/14/19 call completed with patient   . Assessed for patient's housing plan and current housing situation  . Discussed patient has a couple of weeks to locate income housing, she is on the waiting list . Encouraged patient to contact [place] as directed by the embedded BSW Daneen Schick to explore any additional options for housing . Updated BSW with patient's reported housing status  Patient Self Care Activities:  . Self administers medications as prescribed  . Calls pharmacy for medication refills . Calls provider office for new concerns or questions  Please see past updates related to this goal by clicking on the "Past Updates" button in the selected goal       Other   . COMPLETED: Assist with Chronic Care Managment and Care Coordination needs       Current Barriers:  Marland Kitchen Knowledge Barriers related to resources and support available to address needs related to Chronic Care Management and Care Coordination  Case Manager Clinical Goal(s):  Marland Kitchen Over the next  30 days, patient will work with CCM team to address needs related to chronic disease management and Care Coordination  CCM RN CM Interventions:  10/14/19 call completed with patient   . See other goals  Patient Self Care Activities:  . Self administers medications as prescribed . Attends all scheduled provider appointments . Calls pharmacy for medication refills . Performs ADL's independently . Performs IADL's independently . Calls provider office for new concerns or questions  Initial goal documentation        Telephone follow up appointment with care management team member scheduled for: 11/03/19   Barb Merino, RN, BSN, CCM Care Management Coordinator [place] [facility]  Direct Phone: [phone]

## 2019-10-20 NOTE — Telephone Encounter (Signed)
Remote ICM transmission received.  Attempted call to patient on home and cell phone regarding ICM remote transmission.  Cell phone does not have voice mail set up and home number no longer a working number.

## 2019-10-20 NOTE — Patient Instructions (Signed)
Visit Information  Goals Addressed      Patient Stated   . "I am having a Lupus flare and may be trying to have CHF" (pt-stated)       Current Barriers:  Marland Kitchen Knowledge Deficits related to diagnosis and treatment of CHF . Knowledge Deficits related to diagnosis and treatment of Lupus flare  Nurse Case Manager Clinical Goal(s):  Marland Kitchen Over the next 14 days, patient will work with PCP and or Cardiology to address needs related to symptoms suggestive of Lupus and or CHF  . Over the next 30 days, patient will keep all MD follow up appointments, she will adhere to taking all prescribed medications and follow all MD recommendations for symptoms management of CHF and Lupus  CCM RN CM Interventions:  10/14/19 call completed with patient   . Discussed patient has not been able to weigh herself at home, she feels as if she is having a Lupus flare with increased inflammation and fatigue; patient states she may also be trying to have a CHF exacerbation and is experiencing mild shortness of breath and chest discomfort . Discussed patient has placed a call to her Cardiologist and is requesting to be seen, she is waiting on a return call . Evaluation of current treatment plan related to symptom management of CHF and Lupus and patient's adherence to plan as established by provider. . Advised patient to contact her Cardiologist to report s/s suggestive of CHF; instructed patient to contact the [place] office if unable to be seen by the Cardiologist today, patient verbalizes understanding and is agreeable  . Reviewed medications with patient and discussed patient has recently completed a steroid dose pack but was prescribed to repeat a second dose pack; patient instructed to discuss this with her Cardiologist before starting; educated patient about the importance of closely monitoring her BP due to steroids can increase her pressure; patient reports BP today is 148/101 . Collaborated with provider [name], FNP  regarding patient's c/o shortness of breath, increased edema, fatigue and chest discomfort; advised patient's BP today is 148/101; advised she is waiting to hear back from the Cardiologist in hopes to be seen  . Discussed plans with patient for ongoing care management follow up and provided patient with direct contact information for care management team  Patient Self Care Activities:  . Self administers medications as prescribed . Calls provider office for new concerns or questions  Initial goal documentation     . "I need somewhere to stay" (pt-stated)       Current Barriers:  . Financial constraints related to long wait period for disability approval  Clinical Social Work Clinical Goal(s):  Marland Kitchen Over the next 45 days the patient will become more knowledgeable of low income housing options within [place] Interventions: Completed 10/06/2019 . Outbound call to the patient to assess progression of patient stated goal . Confirmed the patient has received resources regarding housing options . Informed by the patient she would need a place to live by Wednesday October 21 due to [place] home visit assessment scheduled by DSS "my grand-daughter has to be in her room" . Discussed challenges in locating housing in such a short time- the patient is not interested in accessing a shelter at this time. The patient is unable to identify family or friends she would be able to stay with . Assessed for patient financial ability to move into an apartment at this time - the patient identifies some savings but not enough to  cover first month rent and deposit for most apartments . Encouraged the patient to contact low income housing options provided by SW in case there may be an opening . Referred patient to [place] via [place] in hopes of locating housing resources to assist with patient needs  CCM RN CM Interventions:  10/14/19 call completed with patient   . Assessed for patient's  housing plan and current housing situation  . Discussed patient has a couple of weeks to locate income housing, she is on the waiting list . Encouraged patient to contact [place] as directed by the embedded BSW Daneen Schick to explore any additional options for housing . Updated BSW with patient's reported housing status  Patient Self Care Activities:  . Self administers medications as prescribed . Calls pharmacy for medication refills . Calls provider office for new concerns or questions  Please see past updates related to this goal by clicking on the "Past Updates" button in the selected goal        Other   . COMPLETED: Assist with Chronic Care Managment and Care Coordination needs       Current Barriers:  Marland Kitchen Knowledge Barriers related to resources and support available to address needs related to Chronic Care Management and Care Coordination  Case Manager Clinical Goal(s):  Marland Kitchen Over the next 30 days, patient will work with CCM team to address needs related to chronic disease management and Care Coordination  CCM RN CM Interventions:  10/14/19 call completed with patient   . See other goals  Patient Self Care Activities:  . Self administers medications as prescribed . Attends all scheduled provider appointments . Calls pharmacy for medication refills . Performs ADL's independently . Performs IADL's independently . Calls provider office for new concerns or questions  Initial goal documentation        The patient verbalized understanding of instructions provided today and declined a print copy of patient instruction materials.   Telephone follow up appointment with care management team member scheduled for: 11/03/19  Barb Merino, RN, BSN, CCM Care Management Coordinator [place] [facility]  Direct Phone: [phone]

## 2019-10-20 NOTE — Progress Notes (Signed)
EPIC Encounter for ICM Monitoring  Patient Name: Christine Mcdowell is a 48 y.o. female Date: 10/20/2019 Primary Care Physican: [name], MD Primary Cardiologist:Bensimhon Electrophysiologist:Taylor Last Weight:184lbs  Attempted call to patient and unable to reach.  Transmission reviewed. ED visit on 9/18 and 10/23.   10/17/2019 [employer] Heart Failure Index5 which is within normal threshold.   Prescribed:Torsemide20 mg take1tablet (20 mg total) Mon, Wed and Friday.Metolazone 2.5 mg takeone tablet as needed for weight gain of 5 lbs within 3 days.   Labs: 10/15/2019 Creatinine 0.82, BUN 18, Potassium 4.1, Sodium 140, GFR >60 08/18/2019 Creatinine 1.01, BUN 9,   Potassium 4.3, Sodium 142, GFR >60  08/14/2019 Creatinine 1.03, BUN 10, Potassium 3.6, Sodium 138, GFR >60  08/04/2019 Creatinine 0.75, BUN 16, Potassium 3.7, Sodium 140, GFR >60  06/18/2019 Creatinine 0.85, BUN 9, Potassium 3.4, Sodium 143, GFR >60 03/29/2019 Creatinine 1.15, BUN 14, Potassium 5.2, Sodium 137, GFR 56-64 02/16/2019 Creatinine 0.98, BUN 10, Potassium 3.6, Sodium 142, GFR >60 A complete set of results can be found in Results Review.  Recommendations:  Unable to reach.    Follow-up plan: ICM clinic phone appointment on 12/02/2019.   91 day device clinic remote transmission 12/01/2019.  Office appt 11/10/2019 with Dr. Haroldine Laws.          Copy of ICM check sent to Dr. Lovena Le.    [name], RN 10/20/2019 3:39 PM

## 2019-10-21 ENCOUNTER — Encounter (HOSPITAL_COMMUNITY): Payer: Self-pay

## 2019-10-27 ENCOUNTER — Telehealth: Payer: Self-pay

## 2019-10-28 ENCOUNTER — Ambulatory Visit: Payer: Self-pay

## 2019-10-28 ENCOUNTER — Telehealth: Payer: Self-pay

## 2019-10-28 DIAGNOSIS — E1169 Type 2 diabetes mellitus with other specified complication: Secondary | ICD-10-CM

## 2019-10-28 NOTE — Chronic Care Management (AMB) (Signed)
  Care Management   Outreach Note  10/28/2019 Name: Christine Mcdowell MRN: [id number] DOB: 09-24-1971  Referred by: [name], FNP Reason for referral : Care Coordination   Third unsuccessful call attempt placed to the patient to assist with care coordination and SW goals. Unfortunately, patient did not answer and does not have a voice mailbox established at this time.  Follow Up Plan: No further follow up planned by SW at this time. SW is available to assist with patient needs in the event the patient returns outreach call attempts.  Daneen Schick, BSW, CDP Social Worker, Certified Dementia Practitioner [place] [facility] [place] [facility] [phone]

## 2019-11-01 ENCOUNTER — Other Ambulatory Visit (HOSPITAL_COMMUNITY): Payer: Self-pay

## 2019-11-01 MED ORDER — METOLAZONE 2.5 MG PO TABS: ORAL_TABLET | ORAL | 3 refills | Status: DC

## 2019-11-02 ENCOUNTER — Telehealth: Payer: Self-pay

## 2019-11-03 ENCOUNTER — Ambulatory Visit: Payer: Self-pay

## 2019-11-03 ENCOUNTER — Telehealth: Payer: Self-pay

## 2019-11-03 ENCOUNTER — Other Ambulatory Visit: Payer: Self-pay

## 2019-11-03 DIAGNOSIS — I5022 Chronic systolic (congestive) heart failure: Secondary | ICD-10-CM

## 2019-11-03 DIAGNOSIS — I10 Essential (primary) hypertension: Secondary | ICD-10-CM

## 2019-11-03 DIAGNOSIS — E1169 Type 2 diabetes mellitus with other specified complication: Secondary | ICD-10-CM

## 2019-11-03 MED ORDER — METFORMIN HCL 500 MG PO TABS: ORAL_TABLET | ORAL | 1 refills | Status: DC

## 2019-11-04 ENCOUNTER — Telehealth: Payer: Self-pay | Admitting: Internal Medicine

## 2019-11-04 ENCOUNTER — Other Ambulatory Visit: Payer: Self-pay

## 2019-11-04 MED ORDER — METFORMIN HCL 500 MG PO TABS: ORAL_TABLET | ORAL | 1 refills | Status: DC

## 2019-11-04 NOTE — Telephone Encounter (Signed)
Spoke with patient. She stated that she was advised by her cardiologist Dr. Haroldine Laws had requested that she switch to a new pulmonologist since MW had changed the patient's medication which caused the patient to end up in the hospital. I asked if she had anyone in mind, she stated that she would prefer a female physician that specializes in asthma. I suggested Dr. Shearon Stalls, she agreed. I also advised her of the process of switching providers, she verbalized understanding.   MW, please advise if you are ok with her switching to another doctor.   Dr. Shearon Stalls, please advise if you are ok with accepting her as a new patient?

## 2019-11-05 NOTE — Telephone Encounter (Signed)
ATC Patient.  LMTCB to schedule new patient appointment with Dr. Shearon Stalls.

## 2019-11-05 NOTE — Patient Instructions (Addendum)
Visit Information  Goals Addressed      Patient Stated   . "I am having a Lupus flare and may be trying to have CHF" (pt-stated)       Current Barriers:  Marland Kitchen Knowledge Deficits related to diagnosis and treatment of CHF . Knowledge Deficits related to diagnosis and treatment of Lupus flare  Nurse Case Manager Clinical Goal(s):  Marland Kitchen Over the next 14 days, patient will work with PCP and or Cardiology to address needs related to symptoms suggestive of Lupus and or CHF   Goal Complete . Over the next 30 days, patient will keep all MD follow up appointments, she will adhere to taking all prescribed medications and follow all MD recommendations for symptoms management of CHF and Lupus  CCM RN CM Interventions:  11/04/19 call completed with patient   . Discussed and reviewed recent IP admission for Asthma exacerbation; discussed referral was placed with [facility] - patient has not received a call and prefers to see a new provider within the practice . Evaluation of current treatment plan related to symptom management of CHF and Lupus and patient's adherence to plan as established by provider. . Advised patient to keep all MD f/u appointments; discussed patient does not have a f/u with her Rheumatologist; discussed patient will call today to schedule an appointment and will discuss ongoing Lupus flare w/o effectiveness from Orencia and Plaquenill . Reviewed medications with patient and discussed patient was d/c home with a repeat low dose steroid pack; reiterated the importance closely monitoring BP and recording all readings; discussed keeping the CCM RN and or PCP well informed of abnormal readings . Discussed plans with patient for ongoing care management follow up and provided patient with direct contact information for care management team  Patient Self Care Activities:  . Self administers medications as prescribed . Calls provider office for new concerns or questions  Please see past  updates related to this goal by clicking on the "Past Updates" button in the selected goal      . "I am not taking any maintenance medications for my Asthma" (pt-stated)       Current Barriers:  Marland Kitchen Knowledge Deficits related to best treatment options for maintenance management of Asthma   Nurse Case Manager Clinical Goal(s):  Marland Kitchen Over the next 30 days, patient will verbalize understanding of plan for f/u with [facility] for evaluation and treatment of Asthma . Over the next 90 days, patient will demonstrate a decrease in Asthma exacerbations as evidenced by patient will experience no ED visits and or IP admissions secondary to Asthma exacerbation   CCM RN CM Interventions:  11/04/19 call completed with patient   . Evaluation of current treatment plan related to Asthma and patient's adherence to plan as established by provider. . Provided education to patient re: disease process and best treatment recommendations for management of Asthma; reviewed and discussed post d/c instructions following IP admit for Asthma exacerbation  . Reviewed medications with patient and discussed patient is not taking a maintenance corticosteroid inhaler at this time . Collaborated with [facility] regarding recent referral for patient re-establishment wit h a new provider; advised patient prefers not to see Dr. Melvyn Novas; was advised the referral was received and the office will reach out to Ms. Ogando to schedule a new patient appointment  . Discussed plans with patient for ongoing care management follow up and provided patient with direct contact information for care management team  Patient Self Care Activities:  .  Self administers medications as prescribed . Calls pharmacy for medication refills . Performs ADL's independently . Performs IADL's independently . Calls provider office for new concerns or questions  Initial goal documentation     . I need help getting a replacement Dexcom scanner  glucometer (pt-stated)       Current Barriers:  . Issues replacing receiver due to insurance guidelines . Financial limitations to cover out of pocket costs  CCM PharmD Clinical Goal(s):  Marland Kitchen Over the next 45 days the patient will work with PCP and PharmD to address glucose monitoring system needs.  CCM PharmDInterventions: Completed call on 09/27/2019 . Comprehensive medication review performed.  Updated medication list in the EMR. . Determined the patient needs assistance in obtaining a new receiver for her DexCom G6. The patient obtained this monitoring system less than one year ago. Unfortunately, approximately two months ago the receiver was thrown away . "My [employer] says it is too soon to replace and Medicare says it is considered DME".  Called [employer] and requested a "lost override" for Dexcom scanner device.  This was denied.  Only 1 scanner device is allowed per 365 calendar days. . Assessed for patients ability to check blood sugar readings at this time . 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  Will discuss with PCP about finger checks.  I'm unsure of the need to check blood sugar 1-12 times per day unless patient was to be put of sliding scale.  She is checking her blood sugar this often, however no action is being taken.  She is on prednisone 40mg  daily and may need sliding scale based on her readings.  Will explore Freestyle Libre possibilities as able. . Will have Accucheck Guide strips called in for patient.  She was able to get AccuCheck Guide machine.  She would [facility] Phone: [phone]  Text: [phone]  Fax: [phone]    CCM RN CM Interventions  11/04/19  completed call with patient   . Collaboration with embedded Pharm D Lottie Dawson regarding Dexcom status . Discussed Almyra Free will request authorization from Medicare to provide the [facility] transmitter . Discussed with patient she will continue to Self check CBG's with manual glucometer and record all readings . Reiterated best glucose control should reflect FBS at 80-130 . Reviewed and discussed indication, dosage and frequency of patient's current insulin and oral antihyperglycemic medications; reinforced importance of taking these medications exactly as prescribed w/o missed doses  . Patient Self Care Activities:  . Self administers medications as prescribed . Calls pharmacy for medication refills . Calls provider office for new concerns or questions  Please see past updates related to this goal by clicking on the "Past Updates" button in the selected goal        The patient verbalized understanding of instructions provided today and declined a print copy of patient instruction materials.   Telephone follow up appointment with care management team member scheduled for:12/06/19   Barb Merino, RN, BSN, CCM Care Management Coordinator [facility]  Direct Phone: [phone]

## 2019-11-05 NOTE — Telephone Encounter (Signed)
Note my last comments on this topic (which was not asthma but pnds/upper airway cough syndrome) are   "May be that entresto lowers the threshold for cough from pnds but it is needed for her chf so try to keep on for now and pulmonary f/u can be prn " and I did not plan to see her back."  Fine with me to refer to Dr Shearon Stalls and she and Dr Haroldine Laws can sort out the cough issue if it is still problematic and let me know if she finds the patient really has asthma as I did not reach that conclusion.  thx

## 2019-11-05 NOTE — Telephone Encounter (Signed)
Not a problem. Happy to see her.

## 2019-11-05 NOTE — Chronic Care Management (AMB) (Signed)
Chronic Care Management   Follow Up Note   11/04/2019 Name: Ahniya Mitchum MRN: [id number] DOB: 12/27/1970  Referred by: [name], MD Reason for referral : Chronic Care Management (CCM RNCM Telephone Follow up)   Amoura Ransier is a 48 y.o. year old female who is a primary care patient of [name], MD. The CCM team was consulted for assistance with chronic disease management and care coordination needs.    Review of patient status, including review of consultants reports, relevant laboratory and other test results, and collaboration with appropriate care team members and the patient's provider was performed as part of comprehensive patient evaluation and provision of chronic care management services.    SDOH (Social Determinants of Health) screening performed today: None. See Care Plan for related entries.   Outbound CCM RN call placed to patient for follow up on progression toward wellness of her chronic health conditions.   Outpatient Encounter Medications as of 11/03/2019  Medication Sig  . Abatacept (ORENCIA) 125 MG/ML SOSY Inject into the skin once a week.  Marland Kitchen ACCU-CHEK FASTCLIX LANCETS MISC USE TO PRICK FINGER TO TEST BLOOD SUGAR BEFORE BREAKFAST AND DINNER  . albuterol (ACCUNEB) 1.25 MG/3ML nebulizer solution Take 3 mLs (1.25 mg total) by nebulization every 6 (six) hours as needed for wheezing.  Marland Kitchen aspirin 81 MG EC tablet Take by mouth daily. Taking 2 tablets  . atorvastatin (LIPITOR) 10 MG tablet TAKE 1 TABLET(10 MG) BY MOUTH DAILY  . Azelastine HCl 137 MCG/SPRAY SOLN Place 2 sprays into the nose daily.  . Bacillus Coagulans-Inulin (PROBIOTIC) 1-250 BILLION-MG CAPS Take 1 capsule by mouth daily.  . blood glucose meter kit and supplies KIT Dispense based on patient and insurance preference. Use up to four times daily as directed. (FOR ICD-9 250.00, 250.01). FILL BASED ON INSURANCE PREFERENCE.  Marland Kitchen Blood Glucose Monitoring Suppl (ACCU-CHEK AVIVA PLUS) w/Device KIT  DX: E11.9. Use as directed to test blood sugar  . carvedilol (COREG) 25 MG tablet TAKE 1 TABLET(25 MG) BY MOUTH TWICE DAILY WITH A MEAL  . ciprofloxacin (CIPRO) 500 MG tablet Take 1 tablet (500 mg total) by mouth 2 (two) times daily. One po bid x 7 days  . citalopram (CELEXA) 10 MG tablet TAKE 1 TABLET BY MOUTH EVERY DAY  . Continuous Blood Gluc Receiver (DEXCOM G6 RECEIVER) DEVI 1 Device by Does not apply route 3 (three) times daily before meals.  . Continuous Blood Gluc Sensor (DEXCOM G6 SENSOR) MISC Inject 1 each into the skin 3 (three) times daily.  . Continuous Blood Gluc Transmit (DEXCOM G6 TRANSMITTER) MISC 1 Device by Does not apply route 3 (three) times daily before meals.  . diclofenac sodium (VOLTAREN) 1 % GEL Apply 1 g topically as needed.  . dicyclomine (BENTYL) 20 MG tablet Take 1 tablet (20 mg total) by mouth 3 (three) times daily as needed for spasms.  . folic acid (FOLVITE) 1 MG tablet TAKE 1 TABLET BY MOUTH EVERY MORNING (Patient taking differently: Take 1 mg by mouth daily. )  . glucose blood (ACCU-CHEK GUIDE) test strip DX: E11.9 Use as instructed to test blood sugars 3-4 times daily.  . hydrALAZINE (APRESOLINE) 50 MG tablet Take 1 tablet (50 mg total) by mouth 3 (three) times daily.  Marland Kitchen HYDROcodone-acetaminophen (NORCO) 5-325 MG tablet Take 1 tablet by mouth every 4 (four) hours as needed (for pain).  . hydroxychloroquine (PLAQUENIL) 200 MG tablet Take 1 tablet (200 mg total) by mouth 2 (two) times daily.  Marland Kitchen ipratropium (ATROVENT) 0.03 %  nasal spray USE 2 SPRAYS IN EACH NOSTRIL THREE TIMES DAILY AS NEEDED FOR RHINITIS  . Magnesium 200 MG TABS Take 1 tablet by mouth daily.  . meloxicam (MOBIC) 15 MG tablet Take 15 mg by mouth daily.   . methotrexate (RHEUMATREX) 2.5 MG tablet Take 20 mg by mouth every Friday.   . metolazone (ZAROXOLYN) 2.5 MG tablet TAKE 1 TABLET BY MOUTH AS NEEDED FOR WEIGHT GAIN OF 5 POUNDS WITHIN 3 DAYS AS DIRECTED  . mometasone (NASONEX) 50 MCG/ACT nasal  spray Place 2 sprays into the nose daily.  . montelukast (SINGULAIR) 10 MG tablet TAKE 1 TABLET BY MOUTH DAILY  . Multiple Vitamin (MULTIVITAMIN WITH MINERALS) TABS Take 1 tablet by mouth every morning.   Marland Kitchen NOVOLOG FLEXPEN 100 UNIT/ML FlexPen INJECT UP TO 17 UNITS THREE TIMES DAILY BY SLIDING SCALE AS DIRECTED  . ondansetron (ZOFRAN) 4 MG tablet Take 1 tablet (4 mg total) by mouth every 8 (eight) hours as needed for nausea or vomiting.  . predniSONE (DELTASONE) 20 MG tablet Take 2 tablets (40 mg total) by mouth daily.  Marland Kitchen PROAIR HFA 108 (90 Base) MCG/ACT inhaler INHALE 2 PUFFS BY MOUTH EVERY DAY AS NEEDED FOR SHORTNESS OF BREATH  . sacubitril-valsartan (ENTRESTO) 97-103 MG Take 1 tablet by mouth 2 (two) times daily.  Marland Kitchen spironolactone (ALDACTONE) 25 MG tablet TAKE 1 TABLET(25 MG) BY MOUTH TWICE DAILY  . torsemide (DEMADEX) 20 MG tablet Take 1 tablet Monday, Wednesday , and Friday  . traMADol (ULTRAM) 50 MG tablet Take 1 tablet (50 mg total) by mouth every 6 (six) hours as needed.  . traZODone (DESYREL) 50 MG tablet Take 50 mg by mouth at bedtime.  . TRESIBA FLEXTOUCH 100 UNIT/ML SOPN FlexTouch Pen INJECT 12 UNIT INTO THE SKIN EVERY NIGHT AT BEDTIME PER INSULIN PROTOCOL (Patient taking differently: Inject 12 Units into the skin. )  . Vitamin D, Ergocalciferol, (DRISDOL) 1.25 MG (50000 UT) CAPS capsule TAKE 1 CAPSULE BY MOUTH 2 TIMES EVERY WEEK  . [DISCONTINUED] metFORMIN (GLUCOPHAGE) 500 MG tablet TAKE 1 TABLET BY MOUTH EVERY DAY WITH THE MORNING AND EVENING MEAL   No facility-administered encounter medications on file as of 11/03/2019.      Goals Addressed      Patient Stated   . "I am having a Lupus flare and may be trying to have CHF" (pt-stated)       Current Barriers:  Marland Kitchen Knowledge Deficits related to diagnosis and treatment of CHF . Knowledge Deficits related to diagnosis and treatment of Lupus flare  Nurse Case Manager Clinical Goal(s):  Marland Kitchen Over the next 14 days, patient will work with  PCP and or Cardiology to address needs related to symptoms suggestive of Lupus and or CHF   Goal Complete . Over the next 30 days, patient will keep all MD follow up appointments, she will adhere to taking all prescribed medications and follow all MD recommendations for symptoms management of CHF and Lupus  CCM RN CM Interventions:  11/04/19 call completed with patient   . Discussed and reviewed recent IP admission for Asthma exacerbation; discussed referral was placed with [facility] - patient has not received a call and prefers to see a new provider within the practice . Evaluation of current treatment plan related to symptom management of CHF and Lupus and patient's adherence to plan as established by provider. . Advised patient to keep all MD f/u appointments; discussed patient does not have a f/u with her Rheumatologist; discussed patient will call  today to schedule an appointment and will discuss ongoing Lupus flare w/o effectiveness from Orencia and Plaquenill . Reviewed medications with patient and discussed patient was d/c home with a repeat low dose steroid pack; reiterated the importance closely monitoring BP and recording all readings; discussed keeping the CCM RN and or PCP well informed of abnormal readings . Discussed plans with patient for ongoing care management follow up and provided patient with direct contact information for care management team  Patient Self Care Activities:  . Self administers medications as prescribed . Calls provider office for new concerns or questions  Please see past updates related to this goal by clicking on the "Past Updates" button in the selected goal      . "I am not taking any maintenance medications for my Asthma" (pt-stated)       Current Barriers:  Marland Kitchen Knowledge Deficits related to best treatment options for maintenance management of Asthma   Nurse Case Manager Clinical Goal(s):  Marland Kitchen Over the next 30 days, patient will verbalize  understanding of plan for f/u with [facility] for evaluation and treatment of Asthma . Over the next 90 days, patient will demonstrate a decrease in Asthma exacerbations as evidenced by patient will experience no ED visits and or IP admissions secondary to Asthma exacerbation   CCM RN CM Interventions:  11/04/19 call completed with patient   . Evaluation of current treatment plan related to Asthma and patient's adherence to plan as established by provider. . Provided education to patient re: disease process and best treatment recommendations for management of Asthma; reviewed and discussed post d/c instructions following IP admit for Asthma exacerbation  . Reviewed medications with patient and discussed patient is not taking a maintenance corticosteroid inhaler at this time . Collaborated with [facility] regarding recent referral for patient re-establishment wit h a new provider; advised patient prefers not to see Dr. Melvyn Novas; was advised the referral was received and the office will reach out to Ms. Tanguma to schedule a new patient appointment  . Discussed plans with patient for ongoing care management follow up and provided patient with direct contact information for care management team  Patient Self Care Activities:  . Self administers medications as prescribed . Calls pharmacy for medication refills . Performs ADL's independently . Performs IADL's independently . Calls provider office for new concerns or questions  Initial goal documentation     . I need help getting a replacement Dexcom scanner glucometer (pt-stated)       Current Barriers:  . Issues replacing receiver due to insurance guidelines . Financial limitations to cover out of pocket costs  CCM PharmD Clinical Goal(s):  Marland Kitchen Over the next 45 days the patient will work with PCP and PharmD to address glucose monitoring system needs.  CCM PharmDInterventions: Completed call on 09/27/2019 . Comprehensive  medication review performed.  Updated medication list in the EMR. . Determined the patient needs assistance in obtaining a new receiver for her DexCom G6. The patient obtained this monitoring system less than one year ago. Unfortunately, approximately two months ago the receiver was thrown away . "My BCBS says it is too soon to replace and Medicare says it is considered DME".  Called [employer] and requested a "lost override" for Dexcom scanner device.  This was denied.  Only 1 scanner device is allowed per 365 calendar days. . Assessed for patients ability to check blood sugar readings at this time . 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  Will discuss with PCP about finger checks.  I'm unsure of the need to check blood sugar 1-12 times per day unless patient was to be put of sliding scale.  She is checking her blood sugar this often, however no action is being taken.  She is on prednisone '40mg'$  daily and may need sliding scale based on her readings.  Will explore Freestyle Libre possibilities as able. . Will have Accucheck Guide strips called in for patient.  She was able to get AccuCheck Guide machine.  She would [facility] Phone: [phone]  Text: [phone]  Fax: [phone]    CCM RN CM Interventions  11/04/19 completed call with patient   . Collaboration with embedded Pharm D Lottie Dawson regarding Dexcom status . Discussed Almyra Free will request authorization from Medicare to provide the [facility] transmitter . Discussed with patient she will continue to Self check CBG's with manual glucometer and record all readings . Reiterated best glucose control should reflect FBS at 80-130 .  Reviewed and discussed indication, dosage and frequency of patient's current insulin and oral antihyperglycemic medications; reinforced importance of taking these medications exactly as prescribed w/o missed doses  . Patient Self Care Activities:  . Self administers medications as prescribed . Calls pharmacy for medication refills . Calls provider office for new concerns or questions  Please see past updates related to this goal by clicking on the "Past Updates" button in the selected goal         Telephone follow up appointment with care management team member scheduled for: 12/06/19  Barb Merino, RN, BSN, CCM Care Management Coordinator [facility]  Direct Phone: [phone]

## 2019-11-08 NOTE — Telephone Encounter (Signed)
LMTCB

## 2019-11-09 ENCOUNTER — Ambulatory Visit (INDEPENDENT_AMBULATORY_CARE_PROVIDER_SITE_OTHER): Payer: Medicare Other | Admitting: Pharmacist

## 2019-11-09 DIAGNOSIS — E1169 Type 2 diabetes mellitus with other specified complication: Secondary | ICD-10-CM | POA: Diagnosis not present

## 2019-11-09 DIAGNOSIS — I10 Essential (primary) hypertension: Secondary | ICD-10-CM

## 2019-11-09 NOTE — Telephone Encounter (Signed)
LMTCB x3 for pt. We have attempted to contact pt several times with no success or call back from pt. Per triage protocol, message will be closed.   

## 2019-11-10 ENCOUNTER — Encounter (HOSPITAL_COMMUNITY): Payer: Self-pay | Admitting: Internal Medicine

## 2019-11-10 ENCOUNTER — Ambulatory Visit (HOSPITAL_COMMUNITY)
Admission: RE | Admit: 2019-11-10 | Discharge: 2019-11-10 | Disposition: A | Discharge status: Home / Self Care | Payer: Medicare Other | Source: Ambulatory Visit | Attending: Internal Medicine | Admitting: Internal Medicine

## 2019-11-10 ENCOUNTER — Ambulatory Visit (HOSPITAL_COMMUNITY): Admission: RE | Admit: 2019-11-10 | Payer: Medicare Other | Source: Ambulatory Visit

## 2019-11-10 ENCOUNTER — Other Ambulatory Visit: Payer: Self-pay

## 2019-11-10 ENCOUNTER — Telehealth: Payer: Self-pay

## 2019-11-10 VITALS — BP 148/83 | HR 73 | Wt 193.4 lb

## 2019-11-10 DIAGNOSIS — Z794 Long term (current) use of insulin: Secondary | ICD-10-CM | POA: Diagnosis not present

## 2019-11-10 DIAGNOSIS — Z8249 Family history of ischemic heart disease and other diseases of the circulatory system: Secondary | ICD-10-CM | POA: Insufficient documentation

## 2019-11-10 DIAGNOSIS — I10 Essential (primary) hypertension: Secondary | ICD-10-CM

## 2019-11-10 DIAGNOSIS — Z95810 Presence of automatic (implantable) cardiac defibrillator: Secondary | ICD-10-CM | POA: Insufficient documentation

## 2019-11-10 DIAGNOSIS — Z7982 Long term (current) use of aspirin: Secondary | ICD-10-CM | POA: Insufficient documentation

## 2019-11-10 DIAGNOSIS — I110 Hypertensive heart disease with heart failure: Secondary | ICD-10-CM | POA: Diagnosis not present

## 2019-11-10 DIAGNOSIS — G4733 Obstructive sleep apnea (adult) (pediatric): Secondary | ICD-10-CM | POA: Insufficient documentation

## 2019-11-10 DIAGNOSIS — I5022 Chronic systolic (congestive) heart failure: Secondary | ICD-10-CM | POA: Diagnosis not present

## 2019-11-10 DIAGNOSIS — D573 Sickle-cell trait: Secondary | ICD-10-CM | POA: Insufficient documentation

## 2019-11-10 DIAGNOSIS — M069 Rheumatoid arthritis, unspecified: Secondary | ICD-10-CM | POA: Insufficient documentation

## 2019-11-10 DIAGNOSIS — I428 Other cardiomyopathies: Secondary | ICD-10-CM | POA: Diagnosis not present

## 2019-11-10 DIAGNOSIS — Z79899 Other long term (current) drug therapy: Secondary | ICD-10-CM | POA: Insufficient documentation

## 2019-11-10 DIAGNOSIS — E118 Type 2 diabetes mellitus with unspecified complications: Secondary | ICD-10-CM

## 2019-11-10 DIAGNOSIS — M329 Systemic lupus erythematosus, unspecified: Secondary | ICD-10-CM | POA: Diagnosis not present

## 2019-11-10 DIAGNOSIS — E119 Type 2 diabetes mellitus without complications: Secondary | ICD-10-CM | POA: Insufficient documentation

## 2019-11-10 DIAGNOSIS — Z8673 Personal history of transient ischemic attack (TIA), and cerebral infarction without residual deficits: Secondary | ICD-10-CM | POA: Insufficient documentation

## 2019-11-10 DIAGNOSIS — D509 Iron deficiency anemia, unspecified: Secondary | ICD-10-CM | POA: Insufficient documentation

## 2019-11-10 DIAGNOSIS — M797 Fibromyalgia: Secondary | ICD-10-CM | POA: Insufficient documentation

## 2019-11-10 LAB — BASIC METABOLIC PANEL
Anion gap: 10 (ref 5–15)
BUN: 12 mg/dL (ref 6–20)
CO2: 25 mmol/L (ref 22–32)
Calcium: 8.9 mg/dL (ref 8.9–10.3)
Chloride: 108 mmol/L (ref 98–111)
Creatinine, Ser: 0.91 mg/dL (ref 0.44–1.00)
GFR calc Af Amer: >60 mL/min (ref >60)
GFR calc non Af Amer: >60 mL/min (ref >60)
Glucose, Bld: 82 mg/dL (ref 70–99)
Potassium: 3.5 mmol/L (ref 3.5–5.1)
Sodium: 143 mmol/L (ref 135–145)

## 2019-11-10 LAB — BRAIN NATRIURETIC PEPTIDE: B Natriuretic Peptide: 35 pg/mL (ref 0.0–100.0)

## 2019-11-10 MED ORDER — FARXIGA 10 MG PO TABS: 10.0000 mg | ORAL_TABLET | Freq: Every day | ORAL | 6 refills | Status: DC

## 2019-11-10 MED ORDER — HYDRALAZINE HCL 50 MG PO TABS: 75.0000 mg | ORAL_TABLET | Freq: Three times a day (TID) | ORAL | 6 refills | Status: DC

## 2019-11-10 NOTE — Patient Instructions (Signed)
Increase Hydralazine to 75 mg (1 & 1/2 tabs) Three times a day   Start Farxiga 10 mg daily  Labs done today, we will notify you for abnormal results  Your physician recommends that you schedule a follow-up appointment in: 4 months with echocardiogram  If you have any questions or concerns before your next appointment please send [place] a message through [place] or call our office at [phone].  At the [facility], you and your health needs are our priority. As part of our continuing mission to provide you with exceptional heart care, we have created designated Provider Care Teams. These Care Teams include your primary Cardiologist (physician) and Advanced Practice Providers (APPs- Physician Assistants and Nurse Practitioners) who all work together to provide you with the care you need, when you need it.   You may see any of the following providers on your designated Care Team at your next follow up: Marland Kitchen Dr Glori Bickers . Dr Loralie Champagne . Darrick Grinder, NP . Lyda Jester, PA   Please be sure to bring in all your medications bottles to every appointment.

## 2019-11-10 NOTE — Progress Notes (Signed)
Heart Failure Clinic Note  ID:  Christine Mcdowell, Christine Mcdowell November 18, 1971, MRN [id number]  Location: Home  Provider location: [address] [place] Type of Visit: Established patient   PCP:  [name], MD  Cardiologist:  Glori Bickers, MD    History of Present Illness: Christine Mcdowell is a 48 y.o. 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   Admitted in October 2017 with increased dyspnea and volume overload. Diuresed with IV lasix and transitioned to lasix 40 mg twice a day. LHC/RHC normal filling pressures, EF ~20%, and normal coronaries. Discharge weight 184 pounds.   Admitted to [facility] in September 2019  for ACL repair. Also received IV lasix during hospital admit.   Echo 02/12/19, which showed EF 40-45%, RV normal.   Recent CPX 9/19 with relaltively normal spirometry. Mild HF limitation.   Had R TKA 3/20  And getting around better.   Here for routine f/u. Starting to feel better. BP has been running. Having some episodes of fluid overload taking metolazone about 2x/month. BP high in the morning but drops down to 130-140s in the afternoon. Can do ADLs without too much problem but SOB with more activity. Recently had lupus flare.   ICD interrogated personally in clinic: HeartLogic score 0 (stable). No VT/AF activity level good. Personally reviewed   Cardiac MRI in 02/2015 showed EF 46%. No LGE.  Past Medical History:  Diagnosis Date  . AICD (automatic cardioverter/defibrillator) present 01/28/2018  . Anemia   . Anginal pain ([place])   . Asthma   . Cervical cancer ([place])   . CHF (congestive heart failure) ([place])   . Diabetes mellitus without complication ([place])    steroid induced  . Discoid lupus   . Fibromyalgia   . History of blood transfusion "several"   "related to anemia; had some  w/hysterectomy also"  . Hx of cardiovascular stress test    ETT-Myoview (9/15):  No ischemia, EF 52%; NORMAL  . Hx of echocardiogram    Echo (9/15):  EF 50-55%, ant HK, Gr 1 DD, mild MR, mild LAE, no effusion  . Hypertension   . Iron deficiency anemia    h/o iron transfusions  . Lupus (systemic lupus erythematosus) ([place])   . Migraine    "a few/year" (07/03/2016)  . Pneumonia 12/2015  . RA (rheumatoid arthritis) ([place])    "all over" (07/03/2016)  . Sickle cell trait ([place])   . Stroke ([place]) 2014 X 1; 2015 X 2; 2016 X 1;    "right side of face more relaxed than the other; rare speech hesitation" (07/03/2016)  . Vaginal Pap smear, abnormal    ASCUS; HPV   Past Surgical History:  Procedure Laterality Date  . ABDOMINAL HYSTERECTOMY  2009  . ABDOMINAL WOUND DEHISCENCE  2009  . CARDIAC CATHETERIZATION N/A 10/11/2016   Procedure: Right/Left Heart Cath and Coronary Angiography;  Surgeon: Jolaine Artist, MD;  Location: [facility];  Service: Cardiovascular;  Laterality: N/A;  . DILATION AND CURETTAGE OF UTERUS  1991  . HEMATOMA EVACUATION  2009   abdomen  . ICD IMPLANT  01/28/2018  . ICD IMPLANT N/A 01/28/2018   Procedure: ICD IMPLANT;  Surgeon: Evans Lance, MD;  Location: [place] [facility] [facility];  Service: Cardiovascular;  Laterality: N/A;  . INCISE AND DRAIN ABCESS  2009 X 2   "abdomen after hysterectomy"  .  KNEE ARTHROSCOPY Right 1997  . KNEE SURGERY Right   . TUBAL LIGATION  1996     Current Outpatient Medications  Medication Sig Dispense Refill  . Abatacept (ORENCIA) 125 MG/ML SOSY Inject into the skin once a week.    Marland Kitchen ACCU-CHEK FASTCLIX LANCETS MISC USE TO PRICK FINGER TO TEST BLOOD SUGAR BEFORE BREAKFAST AND DINNER  10  . albuterol (ACCUNEB) 1.25 MG/3ML nebulizer solution Take 3 mLs (1.25 mg total) by nebulization every 6 (six) hours as needed for wheezing. 75 mL 12  . aspirin 81 MG EC tablet Take by mouth daily. Taking 2 tablets    . atorvastatin (LIPITOR) 10 MG tablet  TAKE 1 TABLET(10 MG) BY MOUTH DAILY 90 tablet 0  . Azelastine HCl 137 MCG/SPRAY SOLN Place 2 sprays into the nose daily. 30 mL 3  . Bacillus Coagulans-Inulin (PROBIOTIC) 1-250 BILLION-MG CAPS Take 1 capsule by mouth daily. 30 capsule 2  . blood glucose meter kit and supplies KIT Dispense based on patient and insurance preference. Use up to four times daily as directed. (FOR ICD-9 250.00, 250.01). FILL BASED ON INSURANCE PREFERENCE. 1 each 3  . Blood Glucose Monitoring Suppl (ACCU-CHEK AVIVA PLUS) w/Device KIT DX: E11.9. Use as directed to test blood sugar 1 kit 1  . carvedilol (COREG) 25 MG tablet TAKE 1 TABLET(25 MG) BY MOUTH TWICE DAILY WITH A MEAL 180 tablet 0  . Continuous Blood Gluc Receiver (DEXCOM G6 RECEIVER) DEVI 1 Device by Does not apply route 3 (three) times daily before meals. 1 Device 1  . Continuous Blood Gluc Sensor (DEXCOM G6 SENSOR) MISC Inject 1 each into the skin 3 (three) times daily. 3 each 1  . Continuous Blood Gluc Transmit (DEXCOM G6 TRANSMITTER) MISC 1 Device by Does not apply route 3 (three) times daily before meals. 1 each 1  . diclofenac sodium (VOLTAREN) 1 % GEL Apply 1 g topically as needed.    . dicyclomine (BENTYL) 20 MG tablet Take 1 tablet (20 mg total) by mouth 3 (three) times daily as needed for spasms. 30 tablet 0  . folic acid (FOLVITE) 1 MG tablet TAKE 1 TABLET BY MOUTH EVERY MORNING (Patient taking differently: Take 1 mg by mouth daily. ) 30 tablet 0  . glucose blood (ACCU-CHEK GUIDE) test strip DX: E11.9 Use as instructed to test blood sugars 3-4 times daily. 100 each 12  . hydrALAZINE (APRESOLINE) 50 MG tablet Take 1 tablet (50 mg total) by mouth 3 (three) times daily. 270 tablet 6  . HYDROcodone-acetaminophen (NORCO) 5-325 MG tablet Take 1 tablet by mouth every 4 (four) hours as needed (for pain). 12 tablet 0  . hydroxychloroquine (PLAQUENIL) 200 MG tablet Take 1 tablet (200 mg total) by mouth 2 (two) times daily. 180 tablet 0  . ipratropium (ATROVENT) 0.03  % nasal spray USE 2 SPRAYS IN EACH NOSTRIL THREE TIMES DAILY AS NEEDED FOR RHINITIS 30 mL 2  . Magnesium 200 MG TABS Take 1 tablet by mouth daily.    . meloxicam (MOBIC) 15 MG tablet Take 15 mg by mouth daily.     . metFORMIN (GLUCOPHAGE) 500 MG tablet TAKE 1 TABLET BY MOUTH EVERY DAY WITH THE MORNING AND EVENING MEAL 180 tablet 1  . methotrexate (RHEUMATREX) 2.5 MG tablet Take 20 mg by mouth every Friday.   3  . metolazone (ZAROXOLYN) 2.5 MG tablet TAKE 1 TABLET BY MOUTH AS NEEDED FOR WEIGHT GAIN OF 5 POUNDS WITHIN 3 DAYS AS DIRECTED 5 tablet 3  . mometasone (  NASONEX) 50 MCG/ACT nasal spray Place 2 sprays into the nose daily. 17 g 2  . montelukast (SINGULAIR) 10 MG tablet TAKE 1 TABLET BY MOUTH DAILY 90 tablet 1  . Multiple Vitamin (MULTIVITAMIN WITH MINERALS) TABS Take 1 tablet by mouth every morning.     Marland Kitchen NOVOLOG FLEXPEN 100 UNIT/ML FlexPen INJECT UP TO 17 UNITS THREE TIMES DAILY BY SLIDING SCALE AS DIRECTED 5 pen 2  . ondansetron (ZOFRAN) 4 MG tablet Take 1 tablet (4 mg total) by mouth every 8 (eight) hours as needed for nausea or vomiting. 20 tablet 0  . predniSONE (DELTASONE) 20 MG tablet Take 2 tablets (40 mg total) by mouth daily. 4 tablet 0  . PROAIR HFA 108 (90 Base) MCG/ACT inhaler INHALE 2 PUFFS BY MOUTH EVERY DAY AS NEEDED FOR SHORTNESS OF BREATH 8.5 g 0  . sacubitril-valsartan (ENTRESTO) 97-103 MG Take 1 tablet by mouth 2 (two) times daily. 60 tablet 6  . spironolactone (ALDACTONE) 25 MG tablet TAKE 1 TABLET(25 MG) BY MOUTH TWICE DAILY 180 tablet 3  . torsemide (DEMADEX) 20 MG tablet Take 1 tablet Monday, Wednesday , and Friday 15 tablet 6  . traMADol (ULTRAM) 50 MG tablet Take 1 tablet (50 mg total) by mouth every 6 (six) hours as needed. 20 tablet 0  . traZODone (DESYREL) 50 MG tablet Take 50 mg by mouth at bedtime.    . TRESIBA FLEXTOUCH 100 UNIT/ML SOPN FlexTouch Pen INJECT 12 UNIT INTO THE SKIN EVERY NIGHT AT BEDTIME PER INSULIN PROTOCOL (Patient taking differently: Inject 12  Units into the skin. ) 15 mL 3  . Vitamin D, Ergocalciferol, (DRISDOL) 1.25 MG (50000 UT) CAPS capsule TAKE 1 CAPSULE BY MOUTH 2 TIMES EVERY WEEK 24 capsule 0   No current facility-administered medications for this encounter.     Allergies:   Hydromorphone, Iodinated diagnostic agents, Other, Erythromycin, Latex, Tape, and Mircette [desogestrel-ethinyl estradiol]   Social History:  The patient  reports that she has never smoked. She has never used smokeless tobacco. She reports that she does not drink alcohol or use drugs.   Family History:  The patient's family history includes Allergies in her father and mother; Arthritis in her mother; Asthma in her maternal grandmother; Cancer in her paternal grandfather; Cushing syndrome in her father; Dementia in her paternal grandmother; Depression in her father; Diabetes in her maternal grandmother; Drug abuse in her mother; Heart attack in her father and maternal grandfather; Heart murmur in her mother; Hypertension in her maternal grandmother.   ROS:  Please see the history of present illness.   All other systems are personally reviewed and negative.   Vitals:   11/10/19 1111  BP: (!) 148/83  Pulse: 73      Exam:   General:  Well appearing. No resp difficulty HEENT: normal Neck: supple. no JVD. Carotids 2+ bilat; no bruits. No lymphadenopathy or thryomegaly appreciated. Cor: PMI nondisplaced. Regular rate & rhythm. No rubs, gallops or murmurs. Lungs: clear Abdomen: obese soft, nontender, nondistended. No hepatosplenomegaly. No bruits or masses. Good bowel sounds. Extremities: no cyanosis, clubbing, rash, edema Neuro: alert & orientedx3, cranial nerves grossly intact. moves all 4 extremities w/o difficulty. Affect pleasant   Recent Labs: 03/29/2019: TSH 0.750 10/15/2019: ALT 14; B Natriuretic Peptide 75.3; BUN 18; Creatinine, Ser 0.82; Hemoglobin 11.2; Platelets 233; Potassium 4.1; Sodium 140  Personally reviewed   Wt Readings from Last 3  Encounters:  11/10/19 87.7 kg (193 lb 6.4 oz)  09/07/19 86.6 kg (191 lb)  08/24/19  83.7 kg (184 lb 9.6 oz)      ASSESSMENT AND PLAN:  1. Chronic Systolic Heart Failure - NICM Cath 10/11/16 with normal cors. CPX 10/2016: Peak VO2: 18.5 (82% predicted peak VO2), VE/VCO2 slope: 30. ECHO 12/18 EF 25-30%  - S/P [employer] HeartLogic ICD 01/2018.  - Cardiac MRI in 02/2015 showed EF 46%. No LGE. - Repeat CPX 9/19 with mild HR limitation - Echo 02/12/19: EF 40-45% - Seems to be doing well NYHA II - Volume status much improved on exam and ICD Continue torsemide 20 MWF. Continue metolazone prn as needed - Continue spiro 25 mg qHS.  - Continue coreg 25 mg BID - Continue Entresto 97/103 mg BID  - Increase hydralazine to 75 mg TID. Not on imdur due to headaches.  - She has been consented for cardiomems, but will plan to monitor her with HeartLogic device for now unless she has frequent hospitalizations for CHF - Add Farxiga 10  - RTC 3 months with echo  2. Sinus tach vs atrial flutter on ICD - Still with palpitations but no VT on ICD - Zio patch 9/20 Rare PVCs and PAcs 3. HTN -> Hypotension - BP elevated. Increase hydral,  4. OSA - Not wearing CPAP. OSA improved with weight loss.  5. DM2  - Add Farxiga 10  Signed, Glori Bickers, MD  11/10/2019 11:37 AM   [facility] [address] [facility] [place] [address] [phone] (office) [phone] (fax)

## 2019-11-11 ENCOUNTER — Other Ambulatory Visit: Payer: Self-pay | Admitting: Nurse Practitioner

## 2019-11-11 DIAGNOSIS — M545 Low back pain, unspecified: Secondary | ICD-10-CM

## 2019-11-11 MED ORDER — TRAMADOL HCL 50 MG PO TABS: 50.0000 mg | ORAL_TABLET | Freq: Four times a day (QID) | ORAL | 0 refills | Status: DC | PRN

## 2019-11-14 NOTE — Progress Notes (Signed)
Chronic Care Management    Visit Note  11/09/2019 Name: Stefan Karen MRN: [id number] DOB: 05/15/1971  Referred by: [name], DNP, FNP Reason for referral : Chronic Care Management   Christine Mcdowell is a 48 y.o. year old female who is a primary care patient of [name], DNP, FNP. The CCM team was consulted for assistance with chronic disease management and care coordination needs related to DMII  Review of patient status, including review of consultants reports, relevant laboratory and other test results, and collaboration with appropriate care team members and the patient's provider was performed as part of comprehensive patient evaluation and provision of chronic care management services.    I spoke with Ms. Sambrano by telephone today.  Medications: Outpatient Encounter Medications as of 11/09/2019  Medication Sig  . Abatacept (ORENCIA) 125 MG/ML SOSY Inject into the skin once a week.  Marland Kitchen ACCU-CHEK FASTCLIX LANCETS MISC USE TO PRICK FINGER TO TEST BLOOD SUGAR BEFORE BREAKFAST AND DINNER  . albuterol (ACCUNEB) 1.25 MG/3ML nebulizer solution Take 3 mLs (1.25 mg total) by nebulization every 6 (six) hours as needed for wheezing.  Marland Kitchen aspirin 81 MG EC tablet Take by mouth daily. Taking 2 tablets  . atorvastatin (LIPITOR) 10 MG tablet TAKE 1 TABLET(10 MG) BY MOUTH DAILY  . Azelastine HCl 137 MCG/SPRAY SOLN Place 2 sprays into the nose daily.  . Bacillus Coagulans-Inulin (PROBIOTIC) 1-250 BILLION-MG CAPS Take 1 capsule by mouth daily.  . blood glucose meter kit and supplies KIT Dispense based on patient and insurance preference. Use up to four times daily as directed. (FOR ICD-9 250.00, 250.01). FILL BASED ON INSURANCE PREFERENCE.  Marland Kitchen Blood Glucose Monitoring Suppl (ACCU-CHEK AVIVA PLUS) w/Device KIT DX: E11.9. Use as directed to test blood sugar  . carvedilol (COREG) 25 MG tablet TAKE 1 TABLET(25 MG) BY MOUTH TWICE DAILY WITH A MEAL  . Continuous Blood Gluc Receiver (DEXCOM  G6 RECEIVER) DEVI 1 Device by Does not apply route 3 (three) times daily before meals.  . Continuous Blood Gluc Sensor (DEXCOM G6 SENSOR) MISC Inject 1 each into the skin 3 (three) times daily.  . Continuous Blood Gluc Transmit (DEXCOM G6 TRANSMITTER) MISC 1 Device by Does not apply route 3 (three) times daily before meals.  . diclofenac sodium (VOLTAREN) 1 % GEL Apply 1 g topically as needed.  . dicyclomine (BENTYL) 20 MG tablet Take 1 tablet (20 mg total) by mouth 3 (three) times daily as needed for spasms.  . folic acid (FOLVITE) 1 MG tablet TAKE 1 TABLET BY MOUTH EVERY MORNING (Patient taking differently: Take 1 mg by mouth daily. )  . glucose blood (ACCU-CHEK GUIDE) test strip DX: E11.9 Use as instructed to test blood sugars 3-4 times daily.  Marland Kitchen HYDROcodone-acetaminophen (NORCO) 5-325 MG tablet Take 1 tablet by mouth every 4 (four) hours as needed (for pain).  . hydroxychloroquine (PLAQUENIL) 200 MG tablet Take 1 tablet (200 mg total) by mouth 2 (two) times daily.  Marland Kitchen ipratropium (ATROVENT) 0.03 % nasal spray USE 2 SPRAYS IN EACH NOSTRIL THREE TIMES DAILY AS NEEDED FOR RHINITIS  . Magnesium 200 MG TABS Take 1 tablet by mouth daily.  . meloxicam (MOBIC) 15 MG tablet Take 15 mg by mouth daily.   . metFORMIN (GLUCOPHAGE) 500 MG tablet TAKE 1 TABLET BY MOUTH EVERY DAY WITH THE MORNING AND EVENING MEAL  . methotrexate (RHEUMATREX) 2.5 MG tablet Take 20 mg by mouth every Friday.   . metolazone (ZAROXOLYN) 2.5 MG tablet TAKE 1 TABLET BY MOUTH  AS NEEDED FOR WEIGHT GAIN OF 5 POUNDS WITHIN 3 DAYS AS DIRECTED  . mometasone (NASONEX) 50 MCG/ACT nasal spray Place 2 sprays into the nose daily.  . montelukast (SINGULAIR) 10 MG tablet TAKE 1 TABLET BY MOUTH DAILY  . Multiple Vitamin (MULTIVITAMIN WITH MINERALS) TABS Take 1 tablet by mouth every morning.   Marland Kitchen NOVOLOG FLEXPEN 100 UNIT/ML FlexPen INJECT UP TO 17 UNITS THREE TIMES DAILY BY SLIDING SCALE AS DIRECTED  . ondansetron (ZOFRAN) 4 MG tablet Take 1 tablet  (4 mg total) by mouth every 8 (eight) hours as needed for nausea or vomiting.  . predniSONE (DELTASONE) 20 MG tablet Take 2 tablets (40 mg total) by mouth daily.  Marland Kitchen PROAIR HFA 108 (90 Base) MCG/ACT inhaler INHALE 2 PUFFS BY MOUTH EVERY DAY AS NEEDED FOR SHORTNESS OF BREATH  . sacubitril-valsartan (ENTRESTO) 97-103 MG Take 1 tablet by mouth 2 (two) times daily.  Marland Kitchen spironolactone (ALDACTONE) 25 MG tablet TAKE 1 TABLET(25 MG) BY MOUTH TWICE DAILY  . torsemide (DEMADEX) 20 MG tablet Take 1 tablet Monday, Wednesday , and Friday  . traZODone (DESYREL) 50 MG tablet Take 50 mg by mouth at bedtime.  . TRESIBA FLEXTOUCH 100 UNIT/ML SOPN FlexTouch Pen INJECT 12 UNIT INTO THE SKIN EVERY NIGHT AT BEDTIME PER INSULIN PROTOCOL (Patient taking differently: Inject 12 Units into the skin. )  . Vitamin D, Ergocalciferol, (DRISDOL) 1.25 MG (50000 UT) CAPS capsule TAKE 1 CAPSULE BY MOUTH 2 TIMES EVERY WEEK  . [DISCONTINUED] ciprofloxacin (CIPRO) 500 MG tablet Take 1 tablet (500 mg total) by mouth 2 (two) times daily. One po bid x 7 days  . [DISCONTINUED] citalopram (CELEXA) 10 MG tablet TAKE 1 TABLET BY MOUTH EVERY DAY  . [DISCONTINUED] hydrALAZINE (APRESOLINE) 50 MG tablet Take 1 tablet (50 mg total) by mouth 3 (three) times daily.  . [DISCONTINUED] traMADol (ULTRAM) 50 MG tablet Take 1 tablet (50 mg total) by mouth every 6 (six) hours as needed.   No facility-administered encounter medications on file as of 11/09/2019.      Objective:   Goals Addressed            This Visit's Progress     Patient Stated   . I need help getting a replacement Dexcom scanner glucometer (pt-stated)       Current Barriers:  . Issues replacing receiver due to insurance guidelines . Financial limitations to cover out of pocket costs  CCM PharmD Clinical Goal(s):  Marland Kitchen Over the next 60 days the patient will work with PCP and PharmD to address glucose monitoring system needs.  CCM PharmDInterventions: Completed call on  11/09/2019 . Comprehensive medication review performed.  Updated medication list in the EMR. . Determined the patient needs assistance in obtaining a new receiver for her DexCom G6. The patient obtained this monitoring system less than one year ago. Unfortunately, approximately two months ago the receiver was thrown away . "My [employer] says it is too soon to replace and Medicare says it is considered DME".  Called [employer] on 09/27/19 and requested a "lost override" for Dexcom scanner device.  This was denied.  Only 1 scanner device is allowed per 365 calendar days. o New RX for Dexcom submitted to [place] on 11/09/19 as they are able to process DME/HME o Patient requested the Dexcom (receiver/transmitter) to be billed under her Medicaid/Care dual SNP plan-->will attempt o She is able to refill the sensors.  She does not need RX for sensors at this time o Patient continues  to check BG ~6-8 times per day due to steroids and sliding scale insulin regimen . Assessed for patients ability to check blood sugar readings at this time . 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  Will discuss with PCP about finger checks.   CCM RN CM Interventions  11/04/19 completed call with patient   . Collaboration with embedded Pharm D Lottie Dawson regarding Dexcom status . Discussed Almyra Free will request authorization from Medicare to provide the [facility] transmitter . Discussed with patient she will continue to Self check CBG's with manual glucometer and record all readings . Reiterated best glucose control should reflect FBS at 80-130 . Reviewed and discussed indication, dosage and frequency of patient's current insulin and oral antihyperglycemic  medications; reinforced importance of taking these medications exactly as prescribed w/o missed doses  . Patient Self Care Activities:  . Self administers medications as prescribed . Calls pharmacy for medication refills . Calls provider office for new concerns or questions   Please see past updates related to this goal by clicking on the "Past Updates" button in the selected goal         Plan:   The care management team will reach out to the patient again over the next 14 days.   Provider Signature Regina Eck, PharmD, BCPS Clinical Pharmacist, [facility] [place]: [phone]

## 2019-11-14 NOTE — Patient Instructions (Signed)
Visit Information  Goals Addressed            This Visit's Progress     Patient Stated   . I need help getting a replacement Dexcom scanner glucometer (pt-stated)       Current Barriers:  . Issues replacing receiver due to insurance guidelines . Financial limitations to cover out of pocket costs  CCM PharmD Clinical Goal(s):  Marland Kitchen Over the next 60 days the patient will work with PCP and PharmD to address glucose monitoring system needs.  CCM PharmDInterventions: Completed call on 11/09/2019 . Comprehensive medication review performed.  Updated medication list in the EMR. . Determined the patient needs assistance in obtaining a new receiver for her DexCom G6. The patient obtained this monitoring system less than one year ago. Unfortunately, approximately two months ago the receiver was thrown away . "My BCBS says it is too soon to replace and Medicare says it is considered DME".  Called [employer] on 09/27/19 and requested a "lost override" for Dexcom scanner device.  This was denied.  Only 1 scanner device is allowed per 365 calendar days. o New RX for Dexcom submitted to [place] on 11/09/19 as they are able to process DME/HME o Patient requested the Dexcom (receiver/transmitter) to be billed under her Medicaid/Care dual SNP plan-->will attempt o She is able to refill the sensors.  She does not need RX for sensors at this time o Patient continues to check BG ~6-8 times per day due to steroids and sliding scale insulin regimen . Assessed for patients ability to check blood sugar readings at this time . 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  Will discuss with PCP about finger checks.   CCM RN CM Interventions   11/04/19 completed call with patient   . Collaboration with embedded Pharm D Lottie Dawson regarding Dexcom status . Discussed Almyra Free will request authorization from Medicare to provide the [facility] transmitter . Discussed with patient she will continue to Self check CBG's with manual glucometer and record all readings . Reiterated best glucose control should reflect FBS at 80-130 . Reviewed and discussed indication, dosage and frequency of patient's current insulin and oral antihyperglycemic medications; reinforced importance of taking these medications exactly as prescribed w/o missed doses  . Patient Self Care Activities:  . Self administers medications as prescribed . Calls pharmacy for medication refills . Calls provider office for new concerns or questions   Please see past updates related to this goal by clicking on the "Past Updates" button in the selected goal          The patient verbalized understanding of instructions provided today and declined a print copy of patient instruction materials.   The care management team will reach out to the patient again over the next 14 days.   SIGNATURE Regina Eck, PharmD, BCPS Clinical Pharmacist, [facility] [place]: [phone]

## 2019-11-22 ENCOUNTER — Other Ambulatory Visit: Payer: Self-pay

## 2019-11-22 ENCOUNTER — Encounter (HOSPITAL_BASED_OUTPATIENT_CLINIC_OR_DEPARTMENT_OTHER): Payer: Self-pay | Admitting: *Deleted

## 2019-11-22 ENCOUNTER — Telehealth: Payer: Self-pay | Admitting: Pharmacist

## 2019-11-22 ENCOUNTER — Ambulatory Visit: Payer: Self-pay | Admitting: Pharmacist

## 2019-11-22 ENCOUNTER — Emergency Department (HOSPITAL_BASED_OUTPATIENT_CLINIC_OR_DEPARTMENT_OTHER): Payer: Medicare Other

## 2019-11-22 ENCOUNTER — Emergency Department (HOSPITAL_BASED_OUTPATIENT_CLINIC_OR_DEPARTMENT_OTHER)
Admission: EM | Admit: 2019-11-22 | Discharge: 2019-11-22 | Disposition: A | Discharge status: Home / Self Care | Payer: Medicare Other | Attending: Emergency Medicine | Admitting: Emergency Medicine

## 2019-11-22 DIAGNOSIS — Z79899 Other long term (current) drug therapy: Secondary | ICD-10-CM | POA: Insufficient documentation

## 2019-11-22 DIAGNOSIS — R0789 Other chest pain: Secondary | ICD-10-CM

## 2019-11-22 DIAGNOSIS — R079 Chest pain, unspecified: Secondary | ICD-10-CM | POA: Diagnosis not present

## 2019-11-22 DIAGNOSIS — I313 Pericardial effusion (noninflammatory): Secondary | ICD-10-CM | POA: Diagnosis not present

## 2019-11-22 DIAGNOSIS — E119 Type 2 diabetes mellitus without complications: Secondary | ICD-10-CM | POA: Insufficient documentation

## 2019-11-22 DIAGNOSIS — E1169 Type 2 diabetes mellitus with other specified complication: Secondary | ICD-10-CM | POA: Diagnosis not present

## 2019-11-22 DIAGNOSIS — E569 Vitamin deficiency, unspecified: Secondary | ICD-10-CM | POA: Diagnosis not present

## 2019-11-22 DIAGNOSIS — Z91040 Latex allergy status: Secondary | ICD-10-CM | POA: Diagnosis not present

## 2019-11-22 DIAGNOSIS — I110 Hypertensive heart disease with heart failure: Secondary | ICD-10-CM | POA: Diagnosis not present

## 2019-11-22 DIAGNOSIS — Z881 Allergy status to other antibiotic agents status: Secondary | ICD-10-CM | POA: Diagnosis not present

## 2019-11-22 DIAGNOSIS — D696 Thrombocytopenia, unspecified: Secondary | ICD-10-CM | POA: Diagnosis not present

## 2019-11-22 DIAGNOSIS — I509 Heart failure, unspecified: Secondary | ICD-10-CM | POA: Diagnosis not present

## 2019-11-22 DIAGNOSIS — M0579 Rheumatoid arthritis with rheumatoid factor of multiple sites without organ or systems involvement: Secondary | ICD-10-CM | POA: Diagnosis not present

## 2019-11-22 DIAGNOSIS — I10 Essential (primary) hypertension: Secondary | ICD-10-CM

## 2019-11-22 DIAGNOSIS — Z885 Allergy status to narcotic agent status: Secondary | ICD-10-CM | POA: Diagnosis not present

## 2019-11-22 DIAGNOSIS — L930 Discoid lupus erythematosus: Secondary | ICD-10-CM | POA: Diagnosis not present

## 2019-11-22 DIAGNOSIS — Z91041 Radiographic dye allergy status: Secondary | ICD-10-CM | POA: Diagnosis not present

## 2019-11-22 DIAGNOSIS — I3139 Other pericardial effusion (noninflammatory): Secondary | ICD-10-CM

## 2019-11-22 DIAGNOSIS — R5383 Other fatigue: Secondary | ICD-10-CM | POA: Diagnosis not present

## 2019-11-22 DIAGNOSIS — M329 Systemic lupus erythematosus, unspecified: Secondary | ICD-10-CM | POA: Diagnosis not present

## 2019-11-22 LAB — CBC WITH DIFFERENTIAL/PLATELET
Abs Immature Granulocytes: 0.03 K/uL (ref 0.00–0.07)
Basophils Absolute: 0 K/uL (ref 0.0–0.1)
Basophils Relative: 0 %
Eosinophils Absolute: 0 K/uL (ref 0.0–0.5)
Eosinophils Relative: 0 %
HCT: 35.1 % — ABNORMAL LOW (ref 36.0–46.0)
Hemoglobin: 10.3 g/dL — ABNORMAL LOW (ref 12.0–15.0)
Immature Granulocytes: 0 %
Lymphocytes Relative: 16 %
Lymphs Abs: 1.2 K/uL (ref 0.7–4.0)
MCH: 26.4 pg (ref 26.0–34.0)
MCHC: 29.3 g/dL — ABNORMAL LOW (ref 30.0–36.0)
MCV: 90 fL (ref 80.0–100.0)
Monocytes Absolute: 0.8 K/uL (ref 0.1–1.0)
Monocytes Relative: 11 %
Neutro Abs: 5.5 K/uL (ref 1.7–7.7)
Neutrophils Relative %: 73 %
Platelets: 128 K/uL — ABNORMAL LOW (ref 150–400)
RBC: 3.9 MIL/uL (ref 3.87–5.11)
RDW: 17.2 % — ABNORMAL HIGH (ref 11.5–15.5)
WBC: 7.5 K/uL (ref 4.0–10.5)
nRBC: 0 % (ref 0.0–0.2)

## 2019-11-22 LAB — TROPONIN I (HIGH SENSITIVITY)
Troponin I (High Sensitivity): <2 ng/L
Troponin I (High Sensitivity): <2 ng/L (ref <18)

## 2019-11-22 LAB — COMPREHENSIVE METABOLIC PANEL WITH GFR
ALT: 15 U/L (ref 0–44)
AST: 20 U/L (ref 15–41)
Albumin: 3.8 g/dL (ref 3.5–5.0)
Alkaline Phosphatase: 43 U/L (ref 38–126)
Anion gap: 8 (ref 5–15)
BUN: 7 mg/dL (ref 6–20)
CO2: 30 mmol/L (ref 22–32)
Calcium: 8.6 mg/dL — ABNORMAL LOW (ref 8.9–10.3)
Chloride: 103 mmol/L (ref 98–111)
Creatinine, Ser: 1.09 mg/dL — ABNORMAL HIGH (ref 0.44–1.00)
GFR calc Af Amer: >60 mL/min
GFR calc non Af Amer: 59 mL/min — ABNORMAL LOW
Glucose, Bld: 122 mg/dL — ABNORMAL HIGH (ref 70–99)
Potassium: 3.2 mmol/L — ABNORMAL LOW (ref 3.5–5.1)
Sodium: 141 mmol/L (ref 135–145)
Total Bilirubin: 0.5 mg/dL (ref 0.3–1.2)
Total Protein: 6.7 g/dL (ref 6.5–8.1)

## 2019-11-22 LAB — LIPASE, BLOOD: Lipase: 26 U/L (ref 11–51)

## 2019-11-22 MED ORDER — DEXCOM G6 SENSOR MISC: 1.0000 | Freq: Three times a day (TID) | 1 refills | Status: DC

## 2019-11-22 MED ORDER — SODIUM CHLORIDE 0.9 % IV BOLUS
250.0000 mL | Freq: Once | INTRAVENOUS | Status: AC
  Administered 2019-11-22: 250 mL via INTRAVENOUS

## 2019-11-22 MED ORDER — DEXCOM G6 TRANSMITTER MISC: 1.0000 | Freq: Three times a day (TID) | 1 refills | Status: DC

## 2019-11-22 MED ORDER — DEXCOM G6 RECEIVER DEVI: 1.0000 | Freq: Three times a day (TID) | 1 refills | Status: DC

## 2019-11-22 MED ORDER — ONDANSETRON HCL 4 MG/2ML IJ SOLN
4.0000 mg | Freq: Once | INTRAMUSCULAR | Status: AC
  Administered 2019-11-22: 4 mg via INTRAVENOUS
  Filled 2019-11-22: qty 2

## 2019-11-22 MED ORDER — MORPHINE SULFATE (PF) 4 MG/ML IV SOLN
4.0000 mg | Freq: Once | INTRAVENOUS | Status: AC
  Administered 2019-11-22: 4 mg via INTRAVENOUS
  Filled 2019-11-22: qty 1

## 2019-11-22 MED ORDER — IBUPROFEN 600 MG PO TABS: 600.0000 mg | ORAL_TABLET | Freq: Four times a day (QID) | ORAL | 0 refills | Status: DC | PRN

## 2019-11-22 MED ORDER — IOHEXOL 350 MG/ML SOLN
75.0000 mL | Freq: Once | INTRAVENOUS | Status: AC | PRN
  Administered 2019-11-22: 04:00:00 via INTRAVENOUS

## 2019-11-22 NOTE — ED Triage Notes (Addendum)
Pt c/o right upper abd pain that started today. Describes as constant. States moving makes the pain worse. States she took tramadol about 30 minutes ago,but states it did not help. Denies any vomiting. C/o nausea. C/o feeling fatigued/weak.  Hx of lupus.

## 2019-11-22 NOTE — ED Notes (Signed)
XR at bedside

## 2019-11-22 NOTE — Discharge Instructions (Addendum)
You were seen today for right lower chest and upper abdominal pain.  Your work-up is largely reassuring.  Given the reproducible nature of the pain, you could have a musculoskeletal component.  Will start on few days of anti-inflammatories to see if this helps.  Call your cardiology office for recommendations regarding earlier echocardiogram.

## 2019-11-22 NOTE — Patient Instructions (Signed)
Visit Information  Goals Addressed            This Visit's Progress     Patient Stated   . I need help getting a replacement Dexcom scanner glucometer (pt-stated)       Current Barriers:  . Issues replacing receiver due to insurance guidelines . Financial limitations to cover out of pocket costs  CCM PharmD Clinical Goal(s):  Marland Kitchen Over the next 60 days the patient will work with PCP and PharmD to address glucose monitoring system needs.  CCM PharmD Interventions: Completed call on 11/22/2019 . Comprehensive medication review performed.  Updated medication list in the EMR. . Determined the patient needs assistance in obtaining a new receiver for her DexCom G6. The patient obtained this monitoring system less than one year ago. Unfortunately, approximately two months ago the receiver was thrown away . "My [employer] says it is too soon to replace and Medicare says it is considered DME".  Called [employer] on 09/27/19 and requested a "lost override" for Dexcom scanner device.  This was denied.  Only 1 scanner device is allowed per 365 calendar days. o New RX for Dexcom submitted to [facility] on 11/09/19 as they are able to process DME/HME through part B o Patient requested the Dexcom (receiver/transmitter) to be billed under her Medicaid/Care dual SNP plan-->will attempt o She is able to refill the sensors.  She does not need RX for sensors at this time o Patient continues to check BG ~6-8 times per day due to steroids and sliding scale insulin regimen . Assessed for patients ability to check blood sugar readings at this time . 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  Will discuss with PCP about finger  checks.   CCM RN CM Interventions  11/04/19 completed call with patient   . Collaboration with embedded Pharm D Lottie Dawson regarding Dexcom status . Discussed Almyra Free will request authorization from Medicare to provide the [facility] transmitter . Discussed with patient she will continue to Self check CBG's with manual glucometer and record all readings . Reiterated best glucose control should reflect FBS at 80-130 . Reviewed and discussed indication, dosage and frequency of patient's current insulin and oral antihyperglycemic medications; reinforced importance of taking these medications exactly as prescribed w/o missed doses  . Patient Self Care Activities:  . Self administers medications as prescribed . Calls pharmacy for medication refills . Calls provider office for new concerns or questions   Please see past updates related to this goal by clicking on the "Past Updates" button in the selected goal          The patient verbalized understanding of instructions provided today and declined a print copy of patient instruction materials.   The care management team will reach out to the patient again over the next 30 days.   SIGNATURE Regina Eck, PharmD, BCPS Clinical Pharmacist, [facility] [place]: [phone]

## 2019-11-22 NOTE — Progress Notes (Signed)
Chronic Care Management   Visit Note  11/22/2019 Name: Christine Mcdowell MRN: [id number] DOB: February 11, 1971  Referred by: [name], MD Reason for referral : CCM   Christine Mcdowell is a 48 y.o. year old female who is a primary care patient of [name], MD. The CCM team was consulted for assistance with chronic disease management and care coordination needs related to DMII  Review of patient status, including review of consultants reports, relevant laboratory and other test results, and collaboration with appropriate care team members and the patient's provider was performed as part of comprehensive patient evaluation and provision of chronic care management services.     Medications: Outpatient Encounter Medications as of 11/22/2019  Medication Sig  . Abatacept (ORENCIA) 125 MG/ML SOSY Inject into the skin once a week.  Marland Kitchen ACCU-CHEK FASTCLIX LANCETS MISC USE TO PRICK FINGER TO TEST BLOOD SUGAR BEFORE BREAKFAST AND DINNER  . albuterol (ACCUNEB) 1.25 MG/3ML nebulizer solution Take 3 mLs (1.25 mg total) by nebulization every 6 (six) hours as needed for wheezing.  Marland Kitchen aspirin 81 MG EC tablet Take by mouth daily. Taking 2 tablets  . atorvastatin (LIPITOR) 10 MG tablet TAKE 1 TABLET(10 MG) BY MOUTH DAILY  . Azelastine HCl 137 MCG/SPRAY SOLN Place 2 sprays into the nose daily.  . Bacillus Coagulans-Inulin (PROBIOTIC) 1-250 BILLION-MG CAPS Take 1 capsule by mouth daily.  . blood glucose meter kit and supplies KIT Dispense based on patient and insurance preference. Use up to four times daily as directed. (FOR ICD-9 250.00, 250.01). FILL BASED ON INSURANCE PREFERENCE.  Marland Kitchen Blood Glucose Monitoring Suppl (ACCU-CHEK AVIVA PLUS) w/Device KIT DX: E11.9. Use as directed to test blood sugar  . carvedilol (COREG) 25 MG tablet TAKE 1 TABLET(25 MG) BY MOUTH TWICE DAILY WITH A MEAL  . Continuous Blood Gluc Receiver (DEXCOM G6 RECEIVER) DEVI 1 Device by Does not apply route 3 (three) times daily before  meals.  . Continuous Blood Gluc Sensor (DEXCOM G6 SENSOR) MISC Inject 1 each into the skin 3 (three) times daily.  . Continuous Blood Gluc Transmit (DEXCOM G6 TRANSMITTER) MISC 1 Device by Does not apply route 3 (three) times daily before meals.  . dapagliflozin propanediol (FARXIGA) 10 MG TABS tablet Take 10 mg by mouth daily before breakfast.  . diclofenac sodium (VOLTAREN) 1 % GEL Apply 1 g topically as needed.  . dicyclomine (BENTYL) 20 MG tablet Take 1 tablet (20 mg total) by mouth 3 (three) times daily as needed for spasms.  . folic acid (FOLVITE) 1 MG tablet TAKE 1 TABLET BY MOUTH EVERY MORNING (Patient taking differently: Take 1 mg by mouth daily. )  . glucose blood (ACCU-CHEK GUIDE) test strip DX: E11.9 Use as instructed to test blood sugars 3-4 times daily.  . hydrALAZINE (APRESOLINE) 50 MG tablet Take 1.5 tablets (75 mg total) by mouth 3 (three) times daily.  Marland Kitchen HYDROcodone-acetaminophen (NORCO) 5-325 MG tablet Take 1 tablet by mouth every 4 (four) hours as needed (for pain).  . hydroxychloroquine (PLAQUENIL) 200 MG tablet Take 1 tablet (200 mg total) by mouth 2 (two) times daily.  Marland Kitchen ibuprofen (ADVIL) 600 MG tablet Take 1 tablet (600 mg total) by mouth every 6 (six) hours as needed.  Marland Kitchen ipratropium (ATROVENT) 0.03 % nasal spray USE 2 SPRAYS IN EACH NOSTRIL THREE TIMES DAILY AS NEEDED FOR RHINITIS  . Magnesium 200 MG TABS Take 1 tablet by mouth daily.  . meloxicam (MOBIC) 15 MG tablet Take 15 mg by mouth daily.   . metFORMIN (  GLUCOPHAGE) 500 MG tablet TAKE 1 TABLET BY MOUTH EVERY DAY WITH THE MORNING AND EVENING MEAL  . methotrexate (RHEUMATREX) 2.5 MG tablet Take 20 mg by mouth every Friday.   . metolazone (ZAROXOLYN) 2.5 MG tablet TAKE 1 TABLET BY MOUTH AS NEEDED FOR WEIGHT GAIN OF 5 POUNDS WITHIN 3 DAYS AS DIRECTED  . mometasone (NASONEX) 50 MCG/ACT nasal spray Place 2 sprays into the nose daily.  . montelukast (SINGULAIR) 10 MG tablet TAKE 1 TABLET BY MOUTH DAILY  . Multiple Vitamin  (MULTIVITAMIN WITH MINERALS) TABS Take 1 tablet by mouth every morning.   Marland Kitchen NOVOLOG FLEXPEN 100 UNIT/ML FlexPen INJECT UP TO 17 UNITS THREE TIMES DAILY BY SLIDING SCALE AS DIRECTED  . ondansetron (ZOFRAN) 4 MG tablet Take 1 tablet (4 mg total) by mouth every 8 (eight) hours as needed for nausea or vomiting.  . predniSONE (DELTASONE) 20 MG tablet Take 2 tablets (40 mg total) by mouth daily.  Marland Kitchen PROAIR HFA 108 (90 Base) MCG/ACT inhaler INHALE 2 PUFFS BY MOUTH EVERY DAY AS NEEDED FOR SHORTNESS OF BREATH  . sacubitril-valsartan (ENTRESTO) 97-103 MG Take 1 tablet by mouth 2 (two) times daily.  Marland Kitchen spironolactone (ALDACTONE) 25 MG tablet TAKE 1 TABLET(25 MG) BY MOUTH TWICE DAILY  . torsemide (DEMADEX) 20 MG tablet Take 1 tablet Monday, Wednesday , and Friday  . traMADol (ULTRAM) 50 MG tablet Take 1 tablet (50 mg total) by mouth every 6 (six) hours as needed.  . traZODone (DESYREL) 50 MG tablet Take 50 mg by mouth at bedtime.  . TRESIBA FLEXTOUCH 100 UNIT/ML SOPN FlexTouch Pen INJECT 12 UNIT INTO THE SKIN EVERY NIGHT AT BEDTIME PER INSULIN PROTOCOL (Patient taking differently: Inject 12 Units into the skin. )  . Vitamin D, Ergocalciferol, (DRISDOL) 1.25 MG (50000 UT) CAPS capsule TAKE 1 CAPSULE BY MOUTH 2 TIMES EVERY WEEK  . [DISCONTINUED] Continuous Blood Gluc Receiver (DEXCOM G6 RECEIVER) DEVI 1 Device by Does not apply route 3 (three) times daily before meals.  . [DISCONTINUED] Continuous Blood Gluc Sensor (DEXCOM G6 SENSOR) MISC Inject 1 each into the skin 3 (three) times daily.  . [DISCONTINUED] Continuous Blood Gluc Transmit (DEXCOM G6 TRANSMITTER) MISC 1 Device by Does not apply route 3 (three) times daily before meals.   No facility-administered encounter medications on file as of 11/22/2019.      Objective:   Goals Addressed            This Visit's Progress     Patient Stated   . I need help getting a replacement Dexcom scanner glucometer (pt-stated)       Current Barriers:  . Issues  replacing receiver due to insurance guidelines . Financial limitations to cover out of pocket costs  CCM PharmD Clinical Goal(s):  Marland Kitchen Over the next 60 days the patient will work with PCP and PharmD to address glucose monitoring system needs.  CCM PharmD Interventions: Completed call on 11/22/2019 . Comprehensive medication review performed.  Updated medication list in the EMR. . Determined the patient needs assistance in obtaining a new receiver for her DexCom G6. The patient obtained this monitoring system less than one year ago. Unfortunately, approximately two months ago the receiver was thrown away . "My [employer] says it is too soon to replace and Medicare says it is considered DME".  Called [employer] on 09/27/19 and requested a "lost override" for Dexcom scanner device.  This was denied.  Only 1 scanner device is allowed per 365 calendar days. o New RX for  Dexcom submitted to [facility] on 11/09/19 as they are able to process DME/HME through part B o Patient requested the Dexcom (receiver/transmitter) to be billed under her Medicaid/Care dual SNP plan-->will attempt o She is able to refill the sensors.  She does not need RX for sensors at this time o Patient continues to check BG ~6-8 times per day due to steroids and sliding scale insulin regimen . Assessed for patients ability to check blood sugar readings at this time . 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  Will discuss with PCP about finger checks.   CCM RN CM Interventions  11/04/19 completed call with patient   . Collaboration with embedded Pharm D Lottie Dawson regarding Dexcom status . Discussed Almyra Free will request authorization from Medicare  to provide the [facility] transmitter . Discussed with patient she will continue to Self check CBG's with manual glucometer and record all readings . Reiterated best glucose control should reflect FBS at 80-130 . Reviewed and discussed indication, dosage and frequency of patient's current insulin and oral antihyperglycemic medications; reinforced importance of taking these medications exactly as prescribed w/o missed doses  . Patient Self Care Activities:  . Self administers medications as prescribed . Calls pharmacy for medication refills . Calls provider office for new concerns or questions   Please see past updates related to this goal by clicking on the "Past Updates" button in the selected goal           Plan:   The care management team will reach out to the patient again over the next 30 days.   Provider Signature Regina Eck, PharmD, BCPS Clinical Pharmacist, [facility] [place]: [phone]

## 2019-11-22 NOTE — ED Provider Notes (Signed)
[facility] EMERGENCY DEPARTMENT Provider Note   CSN: [id number] Arrival date & time: 11/22/19  0033     History   Chief Complaint Chief Complaint  Patient presents with   abd pain    HPI Christine Mcdowell is a 48 y.o. female.     HPI  This a 48 year old female with a extensive past medical history including lupus, heart failure, diabetes, hypertension, rheumatoid arthritis who presents with right lower chest/right upper abdominal pain.  Patient reports onset of symptoms today.  Pain is worse with movement, taking a deep breath.  She denies any cough or fevers.  Pain is not changed with eating.  She reports nausea without vomiting.  She took a tramadol at home but did not have any improvement of her symptoms.  She rates her pain at 10 out of 10.  She denies urinary symptoms.  Generally she states she just does not feel well.  No known sick contacts or Covid exposures.  Reports history of blood clot many years ago but is not on any anticoagulation.   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   Discoid lupus    Fibromyalgia    History of blood transfusion "several"   "related to anemia; had some w/hysterectomy also"   Hx of cardiovascular stress test    ETT-Myoview (9/15):  No ischemia, EF 52%; NORMAL   Hx of echocardiogram    Echo (9/15):  EF 50-55%, ant HK, Gr 1 DD, mild MR, mild LAE, no effusion   Hypertension    Iron deficiency anemia    h/o iron transfusions   Lupus (systemic lupus erythematosus) ([place])    Migraine    "a few/year" (07/03/2016)   Pneumonia 12/2015   RA (rheumatoid arthritis) ([place])    "all over" (07/03/2016)   Sickle cell trait ([place])    Stroke ([place]) 2014 X 1; 2015 X 2; 2016 X 1;    "right side of face more relaxed than the other; rare speech hesitation" (07/03/2016)   Vaginal Pap smear, abnormal    ASCUS; HPV    Patient Active Problem List   Diagnosis Date Noted   Lower abdominal pain 09/07/2019   Diarrhea 09/07/2019   MDD (major depressive disorder), single episode, moderate (HCC) 93/23/5573   Chronic systolic (congestive) heart failure ([place]) 01/28/2018   Upper airway cough syndrome 01/19/2018   Pulmonary infiltrates on CXR 01/19/2018   Migraines 10/08/2017   Sleep apnea with use of continuous positive airway pressure (CPAP) 09/24/2017   Congestive heart failure (CHF) ([place]) 10/09/2016   Respiratory distress 10/09/2016   Rheumatoid arthritis ([place]) 10/09/2016   Asthma 10/09/2016   Chronic pain 10/09/2016   Heart failure ([place]) 10/09/2016   Congestive heart failure ([place])    Chest pain 07/03/2016   Lupus (systemic lupus erythematosus) ([place])    Diabetes mellitus with complication ([place])    Anxiety state    GERD (gastroesophageal reflux disease) 12/28/2015   Essential hypertension 12/26/2015   Type 2 diabetes mellitus ([place]) 22/01/5426   Chronic systolic heart failure ([place]) 08/18/2014   Cardiomyopathy, dilated ([place]) 01/27/2014   Hypokalemia 01/27/2014   Shortness of breath 01/26/2014   SLE (systemic lupus erythematosus) ([place]) 01/26/2014    Past Surgical History:  Procedure Laterality  Date   ABDOMINAL HYSTERECTOMY  2009   ABDOMINAL WOUND DEHISCENCE  2009   CARDIAC CATHETERIZATION N/A 10/11/2016   Procedure: Right/Left Heart Cath and Coronary Angiography;  Surgeon: Jolaine Artist, MD;  Location: [facility];  Service: Cardiovascular;  Laterality: N/A;   DILATION AND CURETTAGE OF UTERUS  1991   HEMATOMA EVACUATION  2009   abdomen   ICD IMPLANT  01/28/2018   ICD IMPLANT N/A 01/28/2018   Procedure: ICD IMPLANT;  Surgeon: Evans Lance, MD;  Location: [facility];  Service: Cardiovascular;  Laterality: N/A;     INCISE AND DRAIN ABCESS  2009 X 2   "abdomen after hysterectomy"   KNEE ARTHROSCOPY Right 1997   KNEE SURGERY Right    TUBAL LIGATION  1996     OB History    Gravida  5   Para  4   Term  3   Preterm  1   AB  1   Living  4     SAB  1   TAB      Ectopic      Multiple      Live Births  4            Home Medications    Prior to Admission medications   Medication Sig Start Date End Date Taking? Authorizing Provider  Abatacept (ORENCIA) 125 MG/ML SOSY Inject into the skin once a week.    [provider]  ACCU-CHEK FASTCLIX LANCETS MISC USE TO PRICK FINGER TO TEST BLOOD SUGAR BEFORE BREAKFAST AND DINNER 08/31/18   [provider]  albuterol (ACCUNEB) 1.25 MG/3ML nebulizer solution Take 3 mLs (1.25 mg total) by nebulization every 6 (six) hours as needed for wheezing. 03/04/16   Lacretia Leigh, MD  aspirin 81 MG EC tablet Take by mouth daily. Taking 2 tablets    [provider]  atorvastatin (LIPITOR) 10 MG tablet TAKE 1 TABLET(10 MG) BY MOUTH DAILY 10/07/19   [name], FNP  Azelastine HCl 137 MCG/SPRAY SOLN Place 2 sprays into the nose daily. 10/19/19   [name], FNP  Bacillus Coagulans-Inulin (PROBIOTIC) 1-250 BILLION-MG CAPS Take 1 capsule by mouth daily. 09/07/19   [name], FNP  blood glucose meter kit and supplies KIT Dispense based on patient and insurance preference. Use up to four times daily as directed. (FOR ICD-9 250.00, 250.01). FILL BASED ON INSURANCE PREFERENCE. 08/18/19   [name], FNP  Blood Glucose Monitoring Suppl (ACCU-CHEK AVIVA PLUS) w/Device KIT DX: E11.9. Use as directed to test blood sugar 09/08/19   [name], FNP  carvedilol (COREG) 25 MG tablet TAKE 1 TABLET(25 MG) BY MOUTH TWICE DAILY WITH A MEAL 06/07/19   Bensimhon, Shaune Pascal, MD  Continuous Blood Gluc Receiver (DEXCOM G6 RECEIVER) DEVI 1 Device by Does not apply route 3 (three) times daily before meals. 07/29/19   [name], FNP  Continuous  Blood Gluc Sensor (DEXCOM G6 SENSOR) MISC Inject 1 each into the skin 3 (three) times daily. 07/26/19   [name], MD  Continuous Blood Gluc Transmit (DEXCOM G6 TRANSMITTER) MISC 1 Device by Does not apply route 3 (three) times daily before meals. 07/26/19   [name], MD  dapagliflozin propanediol (FARXIGA) 10 MG TABS tablet Take 10 mg by mouth daily before breakfast. 11/10/19   Bensimhon, Shaune Pascal, MD  diclofenac sodium (VOLTAREN) 1 % GEL Apply 1 g topically as needed.    [provider]  dicyclomine (BENTYL) 20 MG tablet Take 1  tablet (20 mg total) by mouth 3 (three) times daily as needed for spasms. 09/07/19 09/06/20  [name], FNP  folic acid (FOLVITE) 1 MG tablet TAKE 1 TABLET BY MOUTH EVERY MORNING Patient taking differently: Take 1 mg by mouth daily.  01/30/17   Bensimhon, Shaune Pascal, MD  glucose blood (ACCU-CHEK GUIDE) test strip DX: E11.9 Use as instructed to test blood sugars 3-4 times daily. 09/27/19   [name], MD  hydrALAZINE (APRESOLINE) 50 MG tablet Take 1.5 tablets (75 mg total) by mouth 3 (three) times daily. 11/10/19   Bensimhon, Shaune Pascal, MD  HYDROcodone-acetaminophen (NORCO) 5-325 MG tablet Take 1 tablet by mouth every 4 (four) hours as needed (for pain). 09/11/19   Molpus, John, MD  hydroxychloroquine (PLAQUENIL) 200 MG tablet Take 1 tablet (200 mg total) by mouth 2 (two) times daily. 02/02/19   Bensimhon, Shaune Pascal, MD  ibuprofen (ADVIL) 600 MG tablet Take 1 tablet (600 mg total) by mouth every 6 (six) hours as needed. 11/22/19   Kaius Daino, Barbette Hair, MD  ipratropium (ATROVENT) 0.03 % nasal spray USE 2 SPRAYS IN EACH NOSTRIL THREE TIMES DAILY AS NEEDED FOR RHINITIS 10/11/19   [name], FNP  Magnesium 200 MG TABS Take 1 tablet by mouth daily.    [provider]  meloxicam (MOBIC) 15 MG tablet Take 15 mg by mouth daily.     [provider]  metFORMIN (GLUCOPHAGE) 500 MG tablet TAKE 1 TABLET BY MOUTH EVERY DAY WITH THE MORNING AND EVENING MEAL  11/04/19   [name], FNP  methotrexate (RHEUMATREX) 2.5 MG tablet Take 20 mg by mouth every Friday.  02/06/15   [provider]  metolazone (ZAROXOLYN) 2.5 MG tablet TAKE 1 TABLET BY MOUTH AS NEEDED FOR WEIGHT GAIN OF 5 POUNDS WITHIN 3 DAYS AS DIRECTED 11/01/19   Bensimhon, Shaune Pascal, MD  mometasone (NASONEX) 50 MCG/ACT nasal spray Place 2 sprays into the nose daily. 08/24/19 08/23/20  [name], FNP  montelukast (SINGULAIR) 10 MG tablet TAKE 1 TABLET BY MOUTH DAILY 06/07/19   [name], FNP  Multiple Vitamin (MULTIVITAMIN WITH MINERALS) TABS Take 1 tablet by mouth every morning.     [provider]  NOVOLOG FLEXPEN 100 UNIT/ML FlexPen INJECT UP TO 17 UNITS THREE TIMES DAILY BY SLIDING SCALE AS DIRECTED 09/27/19   [name], FNP  ondansetron (ZOFRAN) 4 MG tablet Take 1 tablet (4 mg total) by mouth every 8 (eight) hours as needed for nausea or vomiting. 12/30/15   Robbie Lis, MD  predniSONE (DELTASONE) 20 MG tablet Take 2 tablets (40 mg total) by mouth daily. 10/16/19   Davonna Belling, MD  PROAIR HFA 108 [phone] Base) MCG/ACT inhaler INHALE 2 PUFFS BY MOUTH EVERY DAY AS NEEDED FOR SHORTNESS OF BREATH 09/14/19   Tanda Rockers, MD  sacubitril-valsartan (ENTRESTO) 97-103 MG Take 1 tablet by mouth 2 (two) times daily. 08/04/19   Bensimhon, Shaune Pascal, MD  spironolactone (ALDACTONE) 25 MG tablet TAKE 1 TABLET(25 MG) BY MOUTH TWICE DAILY 09/24/19   Bensimhon, Shaune Pascal, MD  torsemide [facility]) 20 MG tablet Take 1 tablet Monday, Wednesday , and Friday 08/04/19   Bensimhon, Shaune Pascal, MD  traMADol (ULTRAM) 50 MG tablet Take 1 tablet (50 mg total) by mouth every 6 (six) hours as needed. 11/11/19 11/10/20  [name], FNP  traZODone (DESYREL) 50 MG tablet Take 50 mg by mouth at bedtime.    [provider]  TRESIBA FLEXTOUCH 100 UNIT/ML SOPN FlexTouch Pen INJECT 12 UNIT  INTO THE SKIN EVERY NIGHT AT BEDTIME PER INSULIN PROTOCOL Patient taking differently: Inject 12 Units into the  skin.  06/30/19   [name], FNP  Vitamin D, Ergocalciferol, (DRISDOL) 1.25 MG (50000 UT) CAPS capsule TAKE 1 CAPSULE BY MOUTH 2 TIMES EVERY WEEK 09/27/19   [name], FNP    Family History Family History  Problem Relation Age of Onset   Arthritis Mother    Heart murmur Mother    Drug abuse Mother    Allergies Mother    Heart attack Father    Cushing syndrome Father    Depression Father    Allergies Father    Dementia Paternal Grandmother    Cancer Paternal Grandfather    Diabetes Maternal Grandmother    Hypertension Maternal Grandmother    Asthma Maternal Grandmother    Heart attack Maternal Grandfather     Social History Social History   Tobacco Use   Smoking status: Never Smoker   Smokeless tobacco: Never Used  Substance Use Topics   Alcohol use: No   Drug use: No     Allergies   Hydromorphone, Iodinated diagnostic agents, Other, Erythromycin, Latex, Tape, and Mircette [desogestrel-ethinyl estradiol]   Review of Systems Review of Systems  Constitutional: Negative for fever.  Respiratory: Negative for cough and shortness of breath.   Cardiovascular: Positive for chest pain. Negative for leg swelling.  Gastrointestinal: Positive for abdominal pain and nausea. Negative for constipation, diarrhea and vomiting.  Genitourinary: Negative for dysuria.  All other systems reviewed and are negative.    Physical Exam Updated Vital Signs BP 127/74    Pulse 95    Temp 100.2 F (37.9 C) (Oral)    Resp 11    Ht 1.676 m ('5\' 6"'$ )    Wt 89.8 kg    SpO2 97%    BMI 31.96 kg/m   Physical Exam Vitals signs and nursing note reviewed.  Constitutional:      Appearance: She is well-developed. She is not ill-appearing.  HENT:     Head: Normocephalic and atraumatic.     Mouth/Throat:     Mouth: Mucous membranes are moist.  Eyes:     Pupils: Pupils are equal, round, and reactive to light.  Neck:     Musculoskeletal: Neck supple.  Cardiovascular:      Rate and Rhythm: Normal rate and regular rhythm.     Comments: AICD Pulmonary:     Effort: Pulmonary effort is normal. No respiratory distress.     Breath sounds: No wheezing.  Chest:     Chest wall: Tenderness present.  Abdominal:     General: Bowel sounds are normal.     Palpations: Abdomen is soft.     Tenderness: There is no abdominal tenderness. There is no guarding or rebound.  Musculoskeletal:     Right lower leg: No edema.     Left lower leg: No edema.  Skin:    General: Skin is warm and dry.  Neurological:     Mental Status: She is alert and oriented to person, place, and time.  Psychiatric:        Mood and Affect: Mood normal.      ED Treatments / Results  Labs (all labs ordered are listed, but only abnormal results are displayed) Labs Reviewed  CBC WITH DIFFERENTIAL/PLATELET - Abnormal; Notable for the following components:      Result Value   Hemoglobin 10.3 (*)    HCT 35.1 (*)    MCHC 29.3 (*)  RDW 17.2 (*)    Platelets 128 (*)    All other components within normal limits  COMPREHENSIVE METABOLIC PANEL - Abnormal; Notable for the following components:   Potassium 3.2 (*)    Glucose, Bld 122 (*)    Creatinine, Ser 1.09 (*)    Calcium 8.6 (*)    GFR calc non Af Amer 59 (*)    All other components within normal limits  LIPASE, BLOOD  TROPONIN I (HIGH SENSITIVITY)  TROPONIN I (HIGH SENSITIVITY)    EKG EKG Interpretation  Date/Time:  Monday November 22 2019 01:08:48 EST Ventricular Rate:  95 PR Interval:    QRS Duration: 96 QT Interval:  350 QTC Calculation: 440 R Axis:   -32 Text Interpretation: Sinus rhythm Prolonged PR interval Left axis deviation Confirmed by Thayer Jew [phone]) on 11/22/2019 2:04:07 AM   Radiology Ct Angio Chest Pe W And/or Wo Contrast  Result Date: 11/22/2019 CLINICAL DATA:  PE suspected. High pretest probability. Right upper abdominal pain beginning today. Increased pain with movement. EXAM: CT ANGIOGRAPHY CHEST  WITH CONTRAST TECHNIQUE: Multidetector CT imaging of the chest was performed using the standard protocol during bolus administration of intravenous contrast. Multiplanar CT image reconstructions and MIPs were obtained to evaluate the vascular anatomy. CONTRAST:  <See Chart> OMNIPAQUE IOHEXOL 350 MG/ML SOLN COMPARISON:  12/29/2015. FINDINGS: Cardiovascular: Pericardial effusion measures to 11 mm. Heart is mildly enlarged. Pacing leads are noted. Aorta and great vessel origins are within normal limits. Coronary artery calcifications are present. Pulmonary artery opacification is satisfactory. No focal filling defects are present to suggest pulmonary embolus. Pulmonary artery size is normal. Mediastinum/Nodes: No significant mediastinal, hilar, or axillary adenopathy is present. Esophagus is within normal limits. Lungs/Pleura: Ill-defined lower lobe airspace disease is present bilaterally without significant consolidation. No significant pleural effusion or pneumothorax is present. Upper Abdomen: No acute abnormality. Musculoskeletal: No chest wall abnormality. No acute or significant osseous findings. Review of the MIP images confirms the above findings. IMPRESSION: 1. No pulmonary embolus. 2. Cardiomegaly and pericardial effusion. 3. Coronary artery disease. 4. Ill-defined lower lobe airspace disease bilaterally without significant consolidation. This may represent atelectasis or edema. Infection is considered less likely. These results were called by telephone at the time of interpretation on 11/22/2019 at 4:21 am to provider [facility] , who verbally acknowledged these results. Electronically Signed   By: San Morelle M.D.   On: 11/22/2019 04:21   Dg Chest Portable 1 View  Result Date: 11/22/2019 CLINICAL DATA:  Chest pain EXAM: PORTABLE CHEST 1 VIEW COMPARISON:  10/15/2019 FINDINGS: Left AICD remains in place, unchanged. Cardiomegaly. No confluent opacities, effusions or edema. No acute bony  abnormality. IMPRESSION: Cardiomegaly.  No active disease. Electronically Signed   By: Rolm Baptise M.D.   On: 11/22/2019 01:53    Procedures Procedures (including critical care time)  Medications Ordered in ED Medications  morphine 4 MG/ML injection 4 mg (4 mg Intravenous Given 11/22/19 0210)  ondansetron (ZOFRAN) injection 4 mg (4 mg Intravenous Given 11/22/19 0210)  sodium chloride 0.9 % bolus 250 mL (0 mLs Intravenous Stopped 11/22/19 0532)  iohexol (OMNIPAQUE) 350 MG/ML injection 75 mL ( Intravenous Contrast Given 11/22/19 0347)     Initial Impression / Assessment and Plan / ED Course  I have reviewed the triage vital signs and the nursing notes.  Pertinent labs & imaging results that were available during my care of the patient were reviewed by me and considered in my medical decision making (see chart for details).  Clinical Course as of Nov 21 616  Mon Nov 22, 2019  7672 Long discussion with patient regarding her work-up.  Given location of pain and risk factors for PE, feel it prudent to rule this out.  She is too high risk to get a D-dimer.  She has a listed allergy to CT contrast.  I have reviewed her chart.  It appears her kidney function increased to 1.45 after Lasix and a cardiac catheterization.  During that hospitalization there was some question of whether this was related to contrast or medications.  Her most recent baseline for creatinines has been around 1.  Creatinine today is 1.09.  We discussed the risk and benefits of CT scan with contrast.  Patient is agreeable to proceed with CT with contrast.  I feel this is fairly low risk at this time.  She will be given 250 cc of fluid.   [CH]  0448 Received call from radiology.  No evidence of PE but patient does have a small pericardial effusion.  She does not appear to be in cardiac tamponade.  I have reviewed her last echocardiogram from February 2020.  At that time she also had a pericardial effusion.  Clinically I have low  suspicion that this is contributing to her current symptoms especially given reproducibility of pain on exam.  I did talk to the patient about the effusion and speaking with her cardiologist office regarding more expedited echo.  She is currently scheduled for one at the beginning of the year.  We will touch base with cardiology to ensure that this plan seems reasonable.   [CH]  [id number] Cardiology consulted x 2 with no call back.   [CH]    Clinical Course User Index [CH] Kaio Kuhlman, Barbette Hair, MD       Patient presents with right lower chest/right upper abdominal pain.  Onset within the last 24 hours.  She is overall nontoxic-appearing and vital signs are largely reassuring.  She is afebrile.  No infectious symptoms.  Pain is somewhat atypical.  She has both pleuritic component as well as a reproducible component on exam.  EKG shows no evidence of acute ischemia.  She would be at risk for PE given her lupus and reported history of prior blood clots.  Additionally, gallbladder pathology or intra-abdominal pathology is also consideration.  Patient was given pain and nausea medicine.  Initial troponin is negative.  Chest x-ray shows cardiomegaly.  No pneumonia or pneumothorax.  See clinical course above.  Did obtain a CT to rule out PE.  CT does not show any evidence of PE but does show evidence of pericardial effusion.  She has previously had a pericardial effusion on an echocardiogram.  Difficult to compare whether this has enlarged or is stable.  Likely related to her lupus.  No signs or symptoms of cardiac tamponade.  Pain is fairly atypical for pericarditis as well.  Patient remained hemodynamically stable.  I did attempt to discuss with cardiology follow-up but was unable to get in touch with cardiology on-call.  Repeat troponin is negative.  Patient is followed closely by Dr. Haroldine Laws.  Recommend close follow-up and possible repeat echocardiogram earlier than scheduled.  Patient is agreeable to plan.  Will  place on a few days of anti-inflammatories.  After history, exam, and medical workup I feel the patient has been appropriately medically screened and is safe for discharge home. Pertinent diagnoses were discussed with the patient. Patient was given return precautions.   Final Clinical Impressions(s) /  ED Diagnoses   Final diagnoses:  Pericardial effusion  Atypical chest pain    ED Discharge Orders         Ordered    ibuprofen (ADVIL) 600 MG tablet  Every 6 hours PRN     11/22/19 0615           Merryl Hacker, MD 11/22/19 [phone]

## 2019-11-23 ENCOUNTER — Encounter (HOSPITAL_COMMUNITY): Payer: Self-pay

## 2019-11-23 ENCOUNTER — Other Ambulatory Visit: Payer: Self-pay | Admitting: Nurse Practitioner

## 2019-11-23 ENCOUNTER — Ambulatory Visit: Payer: Self-pay | Admitting: Pharmacist

## 2019-11-23 DIAGNOSIS — E785 Hyperlipidemia, unspecified: Secondary | ICD-10-CM

## 2019-11-23 DIAGNOSIS — E1169 Type 2 diabetes mellitus with other specified complication: Secondary | ICD-10-CM

## 2019-11-24 ENCOUNTER — Ambulatory Visit: Payer: Self-pay

## 2019-11-24 ENCOUNTER — Encounter: Payer: Self-pay | Admitting: Nurse Practitioner

## 2019-11-24 ENCOUNTER — Telehealth (INDEPENDENT_AMBULATORY_CARE_PROVIDER_SITE_OTHER): Payer: Medicare Other | Admitting: Nurse Practitioner

## 2019-11-24 ENCOUNTER — Other Ambulatory Visit: Payer: Self-pay

## 2019-11-24 VITALS — BP 137/91 | HR 69 | Wt 195.0 lb

## 2019-11-24 DIAGNOSIS — G43909 Migraine, unspecified, not intractable, without status migrainosus: Secondary | ICD-10-CM

## 2019-11-24 DIAGNOSIS — E119 Type 2 diabetes mellitus without complications: Secondary | ICD-10-CM

## 2019-11-24 DIAGNOSIS — Z794 Long term (current) use of insulin: Secondary | ICD-10-CM | POA: Diagnosis not present

## 2019-11-24 DIAGNOSIS — J4520 Mild intermittent asthma, uncomplicated: Secondary | ICD-10-CM | POA: Diagnosis not present

## 2019-11-24 DIAGNOSIS — M329 Systemic lupus erythematosus, unspecified: Secondary | ICD-10-CM

## 2019-11-24 NOTE — Progress Notes (Addendum)
Virtual Visit via Video   This visit type was conducted due to national recommendations for restrictions regarding the COVID-19 Pandemic (e.g. social distancing) in an effort to limit this patient's exposure and mitigate transmission in our community.  Due to her co-morbid illnesses, this patient is at least at moderate risk for complications without adequate follow up.  This format is felt to be most appropriate for this patient at this time.  All issues noted in this document were discussed and addressed.  A limited physical exam was performed with this format.    This visit type was conducted due to national recommendations for restrictions regarding the COVID-19 Pandemic (e.g. social distancing) in an effort to limit this patient's exposure and mitigate transmission in our community.  Patients identity confirmed using two different identifiers.  This format is felt to be most appropriate for this patient at this time.  All issues noted in this document were discussed and addressed.  No physical exam was performed (except for noted visual exam findings with Video Visits).    Date:  12/01/2019   ID:  Christine Mcdowell, DOB 04-19-1971, MRN [id number]  Patient Location:  Car - spoke with Christine Mcdowell  Provider location:   Office    Chief Complaint:    History of Present Illness:    Christine Mcdowell is a 48 y.o. female who presents via video conferencing for a telehealth visit today.    The patient does not have symptoms concerning for COVID-19 infection (fever, chills, cough, or new shortness of breath).   She has taken medications for migraines in the past.   Asthma She complains of cough. This is a recurrent problem. The current episode started in the past 7 days. Associated symptoms include headaches. Pertinent negatives include no chest pain, fever, malaise/fatigue or nasal congestion. Her symptoms are aggravated by pollen. Her past medical history is significant for  asthma.  Diabetes She presents for her follow-up diabetic visit. She has type 2 (she is back on prednisone - 8 week long taper 37m) diabetes mellitus. Hypoglycemia symptoms include headaches. Pertinent negatives for hypoglycemia include no dizziness. Pertinent negatives for diabetes include no chest pain. She is following a diabetic diet. When asked about meal planning, she reported none. She has not had a previous visit with a dietitian. She rarely participates in exercise. (260-340 blood sugars, elevated in am  14 units of Tresiba at night Farxiga 10 mg - last week.) An ACE inhibitor/angiotensin II receptor blocker is being taken. She does not see a podiatrist.  She is checking her blood sugar 4-6 times a day due to currently on prednisone   She is injecting insulin four times a day (Tyler Aasinject nightly, Novolog sliding scale three times daily with meals)  Past Medical History:  Diagnosis Date  . AICD (automatic cardioverter/defibrillator) present 01/28/2018  . Anemia   . [place]inal pain ([facility]   . Asthma   . [place]ervical cancer ([place]   . CHF (congestive heart failure) ([facility]   . Diabetes mellitus without complication ([facility]    steroid induced  . Discoid lupus   . Fibromyalgia   . History of blood transfusion "several"   "related to anemia; had some w/hysterectomy also"  . Hx of cardiovascular stress test    ETT-Myoview (9/15):  No ischemia, EF 52%; NORMAL  . Hx of echocardiogram    Echo (9/15):  EF 50-55%, ant HK, Gr 1 DD, mild MR, mild LAE, no effusion  . Hypertension   . Iron deficiency  anemia    h/o iron transfusions  . Lupus (systemic lupus erythematosus) ([place])   . Migraine    "a few/year" (07/03/2016)  . Pneumonia 12/2015  . RA (rheumatoid arthritis) ([place])    "all over" (07/03/2016)  . Sickle cell trait ([place])   . Stroke ([place]) 2014 X 1; 2015 X 2; 2016 X 1;    "right side of face more relaxed than the other; rare speech hesitation" (07/03/2016)  . Vaginal Pap smear, abnormal     ASCUS; HPV   Past Surgical History:  Procedure Laterality Date  . ABDOMINAL HYSTERECTOMY  2009  . ABDOMINAL WOUND DEHISCENCE  2009  . CARDIAC CATHETERIZATION N/A 10/11/2016   Procedure: Right/Left Heart Cath and Coronary Angiography;  Surgeon: Jolaine Artist, MD;  Location: [facility];  Service: Cardiovascular;  Laterality: N/A;  . DILATION AND CURETTAGE OF UTERUS  1991  . HEMATOMA EVACUATION  2009   abdomen  . ICD IMPLANT  01/28/2018  . ICD IMPLANT N/A 01/28/2018   Procedure: ICD IMPLANT;  Surgeon: Evans Lance, MD;  Location: [facility];  Service: Cardiovascular;  Laterality: N/A;  . INCISE AND DRAIN ABCESS  2009 X 2   "abdomen after hysterectomy"  . KNEE ARTHROSCOPY Right 1997  . KNEE SURGERY Right   . TUBAL LIGATION  1996     Current Meds  Medication Sig  . Abatacept (ORENCIA) 125 MG/ML SOSY Inject into the skin once a week.  Marland Kitchen ACCU-CHEK FASTCLIX LANCETS MISC USE TO PRICK FINGER TO TEST BLOOD SUGAR BEFORE BREAKFAST AND DINNER  . albuterol (ACCUNEB) 1.25 MG/3ML nebulizer solution Take 3 mLs (1.25 mg total) by nebulization every 6 (six) hours as needed for wheezing.  Marland Kitchen aspirin 81 MG EC tablet Take by mouth daily. Taking 2 tablets  . atorvastatin (LIPITOR) 10 MG tablet TAKE 1 TABLET(10 MG) BY MOUTH DAILY  . Azelastine HCl 137 MCG/SPRAY SOLN Place 2 sprays into the nose daily.  . Bacillus Coagulans-Inulin (PROBIOTIC) 1-250 BILLION-MG CAPS Take 1 capsule by mouth daily.  . blood glucose meter kit and supplies KIT Dispense based on patient and insurance preference. Use up to four times daily as directed. (FOR ICD-9 250.00, 250.01). FILL BASED ON INSURANCE PREFERENCE.  Marland Kitchen Blood Glucose Monitoring Suppl (ACCU-CHEK AVIVA PLUS) w/Device KIT DX: E11.9. Use as directed to test blood sugar  . carvedilol (COREG) 25 MG tablet TAKE 1 TABLET(25 MG) BY MOUTH TWICE DAILY WITH A MEAL  . Continuous Blood Gluc Receiver (DEXCOM G6 RECEIVER) DEVI 1 Device by Does not apply route 3  (three) times daily before meals.  . Continuous Blood Gluc Sensor (DEXCOM G6 SENSOR) MISC Inject 1 each into the skin 3 (three) times daily.  . Continuous Blood Gluc Transmit (DEXCOM G6 TRANSMITTER) MISC 1 Device by Does not apply route 3 (three) times daily before meals.  . dapagliflozin propanediol (FARXIGA) 10 MG TABS tablet Take 10 mg by mouth daily before breakfast.  . diclofenac sodium (VOLTAREN) 1 % GEL Apply 1 g topically as needed.  . dicyclomine (BENTYL) 20 MG tablet Take 1 tablet (20 mg total) by mouth 3 (three) times daily as needed for spasms.  . folic acid (FOLVITE) 1 MG tablet TAKE 1 TABLET BY MOUTH EVERY MORNING (Patient taking differently: Take 1 mg by mouth daily. )  . glucose blood (ACCU-CHEK GUIDE) test strip DX: E11.9 Use as instructed to test blood sugars 3-4 times daily.  . hydrALAZINE (APRESOLINE) 50 MG tablet Take 1.5 tablets (75 mg total)  by mouth 3 (three) times daily.  Marland Kitchen HYDROcodone-acetaminophen (NORCO) 5-325 MG tablet Take 1 tablet by mouth every 4 (four) hours as needed (for pain).  . hydroxychloroquine (PLAQUENIL) 200 MG tablet Take 1 tablet (200 mg total) by mouth 2 (two) times daily.  Marland Kitchen ibuprofen (ADVIL) 600 MG tablet Take 1 tablet (600 mg total) by mouth every 6 (six) hours as needed.  . Magnesium 200 MG TABS Take 1 tablet by mouth daily.  . meloxicam (MOBIC) 15 MG tablet Take 15 mg by mouth daily.   . metFORMIN (GLUCOPHAGE) 500 MG tablet TAKE 1 TABLET BY MOUTH EVERY DAY WITH THE MORNING AND EVENING MEAL  . methotrexate (RHEUMATREX) 2.5 MG tablet Take 20 mg by mouth every Friday.   . metolazone (ZAROXOLYN) 2.5 MG tablet TAKE 1 TABLET BY MOUTH AS NEEDED FOR WEIGHT GAIN OF 5 POUNDS WITHIN 3 DAYS AS DIRECTED  . mometasone (NASONEX) 50 MCG/ACT nasal spray Place 2 sprays into the nose daily.  . montelukast (SINGULAIR) 10 MG tablet TAKE 1 TABLET BY MOUTH DAILY  . Multiple Vitamin (MULTIVITAMIN WITH MINERALS) TABS Take 1 tablet by mouth every morning.   Marland Kitchen NOVOLOG  FLEXPEN 100 UNIT/ML FlexPen INJECT UP TO 17 UNITS THREE TIMES DAILY BY SLIDING SCALE AS DIRECTED  . ondansetron (ZOFRAN) 4 MG tablet Take 1 tablet (4 mg total) by mouth every 8 (eight) hours as needed for nausea or vomiting.  . predniSONE (DELTASONE) 20 MG tablet Take 2 tablets (40 mg total) by mouth daily.  Marland Kitchen PROAIR HFA 108 (90 Base) MCG/ACT inhaler INHALE 2 PUFFS BY MOUTH EVERY DAY AS NEEDED FOR SHORTNESS OF BREATH  . sacubitril-valsartan (ENTRESTO) 97-103 MG Take 1 tablet by mouth 2 (two) times daily.  Marland Kitchen spironolactone (ALDACTONE) 25 MG tablet TAKE 1 TABLET(25 MG) BY MOUTH TWICE DAILY  . torsemide (DEMADEX) 20 MG tablet Take 1 tablet Monday, Wednesday , and Friday  . traMADol (ULTRAM) 50 MG tablet Take 1 tablet (50 mg total) by mouth every 6 (six) hours as needed.  . traZODone (DESYREL) 50 MG tablet Take 50 mg by mouth at bedtime.  . TRESIBA FLEXTOUCH 100 UNIT/ML SOPN FlexTouch Pen INJECT 12 UNIT INTO THE SKIN EVERY NIGHT AT BEDTIME PER INSULIN PROTOCOL (Patient taking differently: Inject 12 Units into the skin. )  . Vitamin D, Ergocalciferol, (DRISDOL) 1.25 MG (50000 UT) CAPS capsule TAKE 1 CAPSULE BY MOUTH 2 TIMES EVERY WEEK     Allergies:   Hydromorphone, Iodinated diagnostic agents, Other, Erythromycin, Latex, Tape, and Mircette [desogestrel-ethinyl estradiol]   Social History   Tobacco Use  . Smoking status: Never Smoker  . Smokeless tobacco: Never Used  Substance Use Topics  . Alcohol use: No  . Drug use: No     Family Hx: The patient's family history includes Allergies in her father and mother; Arthritis in her mother; Asthma in her maternal grandmother; Cancer in her paternal grandfather; Cushing syndrome in her father; Dementia in her paternal grandmother; Depression in her father; Diabetes in her maternal grandmother; Drug abuse in her mother; Heart attack in her father and maternal grandfather; Heart murmur in her mother; Hypertension in her maternal grandmother.  ROS:    Please see the history of present illness.    Review of Systems  Constitutional: Negative for fever and malaise/fatigue.  Respiratory: Positive for cough.   Cardiovascular: Negative for chest pain.  Neurological: Positive for headaches. Negative for dizziness.    All other systems reviewed and are negative.   Labs/Other Tests and Data  Reviewed:    Recent Labs: 03/29/2019: TSH 0.750 11/10/2019: B Natriuretic Peptide 35.0 11/22/2019: ALT 15; BUN 7; Creatinine, Ser 1.09; Hemoglobin 10.3; Platelets 128; Potassium 3.2; Sodium 141   Recent Lipid Panel Lab Results  Component Value Date/Time   CHOL 216 (H) 03/29/2019 03:46 PM   TRIG 252 (H) 03/29/2019 03:46 PM   HDL 40 03/29/2019 03:46 PM   CHOLHDL 5.4 (H) 03/29/2019 03:46 PM   LDLCALC 126 (H) 03/29/2019 03:46 PM    Wt Readings from Last 3 Encounters:  11/24/19 195 lb (88.5 kg)  11/22/19 198 lb (89.8 kg)  11/10/19 193 lb 6.4 oz (87.7 kg)     Exam:    Vital Signs:  BP (!) 137/91 (BP Location: Left Arm, Patient Position: Sitting, Cuff Size: Large)   Pulse 69   Wt 195 lb (88.5 kg)   BMI 31.47 kg/m     Physical Exam  Constitutional: She is oriented to person, place, and time and well-developed, well-nourished, and in no distress. No distress.  Pulmonary/Chest: Effort normal. No respiratory distress.  Neurological: She is alert and oriented to person, place, and time.  Psychiatric: Mood, memory, affect and judgment normal.    ASSESSMENT & PLAN:    1. Type 2 diabetes mellitus without complication, with long-term current use of insulin (HCC)  She is checking her blood sugar 4-6 times a day due to currently on prednisone   She will come in next week for labs  Patient will greatly benefit from a scanner glucometer due to current testing and medication requirements. Application pending  She is injecting insulin four times a day Tyler Aas inject nightly, Novolog sliding scale three times daily with meals)  2. Migraine without  status migrainosus, not intractable, unspecified migraine type  This is improved  3. Intermittent asthma without complication, unspecified asthma severity  Chronic  Continue with singular daily  She would like benefit from a LABA  4. Systemic lupus erythematosus, unspecified SLE type, unspecified organ involvement status ([place])  Chronic   Continue follow up with Rheumatologist   COVID-19 Education: The signs and symptoms of COVID-19 were discussed with the patient and how to seek care for testing (follow up with PCP or arrange E-visit).  The importance of social distancing was discussed today.  Patient Risk:   After full review of this patients clinical status, I feel that they are at least moderate risk at this time.  Time:   Today, I have spent 17 minutes/ seconds with the patient with telehealth technology discussing above diagnoses.     Medication Adjustments/Labs and Tests Ordered: Current medicines are reviewed at length with the patient today.  Concerns regarding medicines are outlined above.   Tests Ordered: No orders of the defined types were placed in this encounter.   Medication Changes: No orders of the defined types were placed in this encounter.   Disposition:  Follow up in 3 month(s)  Signed, [name], FNP

## 2019-11-25 NOTE — Patient Instructions (Signed)
Visit Information  Goals Addressed            This Visit's Progress     Patient Stated   . COMPLETED: I need help getting a replacement Dexcom scanner glucometer (pt-stated)       Current Barriers:  . Issues replacing receiver due to insurance guidelines . Financial limitations to cover out of pocket costs  CCM PharmD Clinical Goal(s):  Marland Kitchen Over the next 60 days the patient will work with PCP and PharmD to address glucose monitoring system needs.  CCM PharmD Interventions: Completed call on 11/23/2019 . Comprehensive medication review performed.  Updated medication list in the EMR. . Determined the patient needs assistance in obtaining a new receiver for her DexCom G6. The patient obtained this monitoring system less than one year ago. Unfortunately, approximately two months ago the receiver was thrown away o [employer] for Dexcom submitted to [facility] on 11/09/19 as they are able to process DME/HME through part B--denied** o Patient requested the Dexcom (receiver/transmitter) to be billed under her Medicaid/Care dual SNP plan-->will attempt - Account set up with [facility]--DTO to be faxed to PCP, PharmD to fill out o She is able to refill the sensors.  She does not need RX for sensors at this time o Patient continues to check BG ~6-8 times per day due to steroids and sliding scale insulin regimen . Assessed for patients ability to check blood sugar readings at this time   CCM RN CM Interventions  11/04/19 completed call with patient   . Collaboration with embedded Pharm D Lottie Dawson regarding Dexcom status . Discussed Almyra Free will request authorization from Medicare to provide the [facility] transmitter . Discussed with patient she will continue to Self check CBG's with manual glucometer and record all readings . Reiterated best glucose control should reflect FBS at 80-130 . Reviewed and discussed indication, dosage and frequency of patient's current  insulin and oral antihyperglycemic medications; reinforced importance of taking these medications exactly as prescribed w/o missed doses  . Patient Self Care Activities:  . Self administers medications as prescribed . Calls pharmacy for medication refills . Calls provider office for new concerns or questions   Please see past updates related to this goal by clicking on the "Past Updates" button in the selected goal       . I would like to control my blood sugar (pt-stated)       Current Barriers:  . Diabetes: T2DM; most recent A1c 6.4% on 06/18/19  . Current antihyperglycemic regimen: Tresiba 10 units qHS, Novolog sliding scale, Metformin o Discussed insulin/sliding scale; Patient uses AccuCheck GUIDE glucometer o Working to get patient scanner glucometer-Dexcom through [employer] . Denies hypoglycemic symptoms; denies hyperglycemic symptoms . Current meal patterns: n/a . Current exercise: n/a . Current blood glucose readings: 150-200 (currently on prednisone 20mg  daily--plans to taper-- for autoimmune conditions which presents difficulty managing blodd sugars) . Cardiovascular risk reduction: o Current hypertensive regimen: carvedilol, spironolactone, hydralazine, entresto o Current hyperlipidemia regimen: atorvastatin started 10/07/19 #90DS  Pharmacist Clinical Goal(s):  Marland Kitchen Over the next 90 days, patient with work with PharmD and primary care provider to address needs related to optimized medication management of chronic conditions  Interventions: . Comprehensive medication review performed, medication list updated in electronic medical record.  Reviewed medication fill history via insurance claims data confirming patient appears compliant with having her medications filled on time as prescribed by provider. . Reviewed & discussed the following diabetes-related information with patient: o Continue checking  blood sugars as directed o Follow ADA recommended "diabetes-friendly" diet   (reviewed healthy snack/food options) o Reviewed medication purpose/side effects-->patient denies adverse events o Continue taking all medications as prescribed by provider   Patient Self Care Activities:  . Patient will check blood glucose 3-4 times daily, document, and provide at future appointments . Patient will focus on medication adherence by continuing to take medications as prescribed . Patient will take medications as prescribed . Patient will contact provider with any episodes of hypoglycemia . Patient will report any questions or concerns to provider   Please see past updates related to this goal by clicking on the "Past Updates" button in the selected goal         The patient verbalized understanding of instructions provided today and declined a print copy of patient instruction materials.   The care management team will reach out to the patient again over the next 14 days.   SIGNATURE Regina Eck, PharmD, BCPS Clinical Pharmacist, [facility] [place]: [phone]

## 2019-11-25 NOTE — Progress Notes (Signed)
Chronic Care Management   Visit Note  11/23/2019 Name: Christine Mcdowell MRN: [id number] DOB: 12/06/1971  Referred by: Christine Chard, MD Reason for referral : Chronic Care Management   Christine Mcdowell is a 48 y.o. year old female who is a primary care patient of Christine Chard, MD. The CCM team was consulted for assistance with chronic disease management and care coordination needs related to HLD and DMII  Review of patient status, including review of consultants reports, relevant laboratory and other test results, and collaboration with appropriate care team members and the patient's provider was performed as part of comprehensive patient evaluation and provision of chronic care management services.    I spoke with Ms. Christine Mcdowell by telephone today regarding her diabetes and newly initiated statin for HLD.  Medications: Outpatient Encounter Medications as of 11/23/2019  Medication Sig  . Abatacept (ORENCIA) 125 MG/ML SOSY Inject into the skin once a week.  Marland Kitchen ACCU-CHEK FASTCLIX LANCETS MISC USE TO PRICK FINGER TO TEST BLOOD SUGAR BEFORE BREAKFAST AND DINNER  . albuterol (ACCUNEB) 1.25 MG/3ML nebulizer solution Take 3 mLs (1.25 mg total) by nebulization every 6 (six) hours as needed for wheezing.  Marland Kitchen aspirin 81 MG EC tablet Take by mouth daily. Taking 2 tablets  . atorvastatin (LIPITOR) 10 MG tablet TAKE 1 TABLET(10 MG) BY MOUTH DAILY  . Azelastine HCl 137 MCG/SPRAY SOLN Place 2 sprays into the nose daily.  . Bacillus Coagulans-Inulin (PROBIOTIC) 1-250 BILLION-MG CAPS Take 1 capsule by mouth daily.  . blood glucose meter kit and supplies KIT Dispense based on patient and insurance preference. Use up to four times daily as directed. (FOR ICD-9 250.00, 250.01). FILL BASED ON INSURANCE PREFERENCE.  Marland Kitchen Blood Glucose Monitoring Suppl (ACCU-CHEK AVIVA PLUS) w/Device KIT DX: E11.9. Use as directed to test blood sugar  . carvedilol (COREG) 25 MG tablet TAKE 1 TABLET(25 MG) BY MOUTH TWICE DAILY  WITH A MEAL  . Continuous Blood Gluc Receiver (DEXCOM G6 RECEIVER) DEVI 1 Device by Does not apply route 3 (three) times daily before meals.  . Continuous Blood Gluc Sensor (DEXCOM G6 SENSOR) MISC Inject 1 each into the skin 3 (three) times daily.  . Continuous Blood Gluc Transmit (DEXCOM G6 TRANSMITTER) MISC 1 Device by Does not apply route 3 (three) times daily before meals.  . dapagliflozin propanediol (FARXIGA) 10 MG TABS tablet Take 10 mg by mouth daily before breakfast.  . diclofenac sodium (VOLTAREN) 1 % GEL Apply 1 g topically as needed.  . dicyclomine (BENTYL) 20 MG tablet Take 1 tablet (20 mg total) by mouth 3 (three) times daily as needed for spasms.  . folic acid (FOLVITE) 1 MG tablet TAKE 1 TABLET BY MOUTH EVERY MORNING (Patient taking differently: Take 1 mg by mouth daily. )  . glucose blood (ACCU-CHEK GUIDE) test strip DX: E11.9 Use as instructed to test blood sugars 3-4 times daily.  . hydrALAZINE (APRESOLINE) 50 MG tablet Take 1.5 tablets (75 mg total) by mouth 3 (three) times daily.  Marland Kitchen HYDROcodone-acetaminophen (NORCO) 5-325 MG tablet Take 1 tablet by mouth every 4 (four) hours as needed (for pain).  . hydroxychloroquine (PLAQUENIL) 200 MG tablet Take 1 tablet (200 mg total) by mouth 2 (two) times daily.  Marland Kitchen ibuprofen (ADVIL) 600 MG tablet Take 1 tablet (600 mg total) by mouth every 6 (six) hours as needed.  Marland Kitchen ipratropium (ATROVENT) 0.03 % nasal spray USE 2 SPRAYS IN EACH NOSTRIL THREE TIMES DAILY AS NEEDED FOR RHINITIS (Patient not taking: Reported on 11/24/2019)  .  Magnesium 200 MG TABS Take 1 tablet by mouth daily.  . meloxicam (MOBIC) 15 MG tablet Take 15 mg by mouth daily.   . metFORMIN (GLUCOPHAGE) 500 MG tablet TAKE 1 TABLET BY MOUTH EVERY DAY WITH THE MORNING AND EVENING MEAL  . methotrexate (RHEUMATREX) 2.5 MG tablet Take 20 mg by mouth every Friday.   . metolazone (ZAROXOLYN) 2.5 MG tablet TAKE 1 TABLET BY MOUTH AS NEEDED FOR WEIGHT GAIN OF 5 POUNDS WITHIN 3 DAYS AS  DIRECTED  . mometasone (NASONEX) 50 MCG/ACT nasal spray Place 2 sprays into the nose daily.  . montelukast (SINGULAIR) 10 MG tablet TAKE 1 TABLET BY MOUTH DAILY  . Multiple Vitamin (MULTIVITAMIN WITH MINERALS) TABS Take 1 tablet by mouth every morning.   Marland Kitchen NOVOLOG FLEXPEN 100 UNIT/ML FlexPen INJECT UP TO 17 UNITS THREE TIMES DAILY BY SLIDING SCALE AS DIRECTED  . ondansetron (ZOFRAN) 4 MG tablet Take 1 tablet (4 mg total) by mouth every 8 (eight) hours as needed for nausea or vomiting.  . predniSONE (DELTASONE) 20 MG tablet Take 2 tablets (40 mg total) by mouth daily.  Marland Kitchen PROAIR HFA 108 (90 Base) MCG/ACT inhaler INHALE 2 PUFFS BY MOUTH EVERY DAY AS NEEDED FOR SHORTNESS OF BREATH  . sacubitril-valsartan (ENTRESTO) 97-103 MG Take 1 tablet by mouth 2 (two) times daily.  Marland Kitchen spironolactone (ALDACTONE) 25 MG tablet TAKE 1 TABLET(25 MG) BY MOUTH TWICE DAILY  . torsemide (DEMADEX) 20 MG tablet Take 1 tablet Monday, Wednesday , and Friday  . traMADol (ULTRAM) 50 MG tablet Take 1 tablet (50 mg total) by mouth every 6 (six) hours as needed.  . traZODone (DESYREL) 50 MG tablet Take 50 mg by mouth at bedtime.  . TRESIBA FLEXTOUCH 100 UNIT/ML SOPN FlexTouch Pen INJECT 12 UNIT INTO THE SKIN EVERY NIGHT AT BEDTIME PER INSULIN PROTOCOL (Patient taking differently: Inject 12 Units into the skin. )  . Vitamin D, Ergocalciferol, (DRISDOL) 1.25 MG (50000 UT) CAPS capsule TAKE 1 CAPSULE BY MOUTH 2 TIMES EVERY WEEK   No facility-administered encounter medications on file as of 11/23/2019.      Objective:   Goals Addressed            This Visit's Progress     Patient Stated   . COMPLETED: I need help getting a replacement Dexcom scanner glucometer (pt-stated)       Current Barriers:  . Issues replacing receiver due to insurance guidelines . Financial limitations to cover out of pocket costs  CCM PharmD Clinical Goal(s):  Marland Kitchen Over the next 60 days the patient will work with PCP and PharmD to address glucose  monitoring system needs.  CCM PharmD Interventions: Completed call on 11/23/2019 . Comprehensive medication review performed.  Updated medication list in the EMR. . Determined the patient needs assistance in obtaining a new receiver for her DexCom G6. The patient obtained this monitoring system less than one year ago. Unfortunately, approximately two months ago the receiver was thrown away o [employer] for Dexcom submitted to [facility] on 11/09/19 as they are able to process DME/HME through part B--denied** o Patient requested the Dexcom (receiver/transmitter) to be billed under her Medicaid/Care dual SNP plan-->will attempt - Account set up with [facility]--DTO to be faxed to PCP, PharmD to fill out o She is able to refill the sensors.  She does not need RX for sensors at this time o Patient continues to check BG ~6-8 times per day due to steroids and sliding scale insulin regimen .  Assessed for patients ability to check blood sugar readings at this time   CCM RN CM Interventions  11/04/19 completed call with patient   . Collaboration with embedded Pharm D Lottie Dawson regarding Dexcom status . Discussed Almyra Free will request authorization from Medicare to provide the [facility] transmitter . Discussed with patient she will continue to Self check CBG's with manual glucometer and record all readings . Reiterated best glucose control should reflect FBS at 80-130 . Reviewed and discussed indication, dosage and frequency of patient's current insulin and oral antihyperglycemic medications; reinforced importance of taking these medications exactly as prescribed w/o missed doses  . Patient Self Care Activities:  . Self administers medications as prescribed . Calls pharmacy for medication refills . Calls provider office for new concerns or questions   Please see past updates related to this goal by clicking on the "Past Updates" button in the selected goal       . I  would like to control my blood sugar (pt-stated)       Current Barriers:  . Diabetes: T2DM; most recent A1c 6.4% on 06/18/19  . Current antihyperglycemic regimen: Tresiba 10 units qHS, Novolog sliding scale, Metformin o Discussed insulin/sliding scale; Patient uses AccuCheck GUIDE glucometer o Working to get patient scanner glucometer-Dexcom through [employer] . Denies hypoglycemic symptoms; denies hyperglycemic symptoms . Current meal patterns: n/a . Current exercise: n/a . Current blood glucose readings: 150-200 (currently on prednisone 55m daily--plans to taper-- for autoimmune conditions which presents difficulty managing blodd sugars) . Cardiovascular risk reduction: o Current hypertensive regimen: carvedilol, spironolactone, hydralazine, entresto o Current hyperlipidemia regimen: atorvastatin started 10/07/19 #90DS  Pharmacist Clinical Goal(s):  .Marland KitchenOver the next 90 days, patient with work with PharmD and primary care provider to address needs related to optimized medication management of chronic conditions  Interventions: . Comprehensive medication review performed, medication list updated in electronic medical record.  Reviewed medication fill history via insurance claims data confirming patient appears compliant with having her medications filled on time as prescribed by provider. . Reviewed & discussed the following diabetes-related information with patient: o Continue checking blood sugars as directed o Follow ADA recommended "diabetes-friendly" diet  (reviewed healthy snack/food options) o Reviewed medication purpose/side effects-->patient denies adverse events o Continue taking all medications as prescribed by provider   Patient Self Care Activities:  . Patient will check blood glucose 3-4 times daily, document, and provide at future appointments . Patient will focus on medication adherence by continuing to take medications as prescribed . Patient will take medications as  prescribed . Patient will contact provider with any episodes of hypoglycemia . Patient will report any questions or concerns to provider   Please see past updates related to this goal by clicking on the "Past Updates" button in the selected goal          Plan:   The care management team will reach out to the patient again over the next 14 days.   Provider Signature JRegina Eck PharmD, BCPS Clinical Pharmacist, [facility] [place] [phone]

## 2019-11-26 DIAGNOSIS — M0579 Rheumatoid arthritis with rheumatoid factor of multiple sites without organ or systems involvement: Secondary | ICD-10-CM | POA: Diagnosis not present

## 2019-11-26 DIAGNOSIS — M2011 Hallux valgus (acquired), right foot: Secondary | ICD-10-CM | POA: Diagnosis not present

## 2019-11-26 DIAGNOSIS — M2012 Hallux valgus (acquired), left foot: Secondary | ICD-10-CM | POA: Diagnosis not present

## 2019-11-26 DIAGNOSIS — M19071 Primary osteoarthritis, right ankle and foot: Secondary | ICD-10-CM | POA: Diagnosis not present

## 2019-11-26 DIAGNOSIS — Z4789 Encounter for other orthopedic aftercare: Secondary | ICD-10-CM | POA: Diagnosis not present

## 2019-11-26 DIAGNOSIS — M19072 Primary osteoarthritis, left ankle and foot: Secondary | ICD-10-CM | POA: Diagnosis not present

## 2019-11-27 DIAGNOSIS — E119 Type 2 diabetes mellitus without complications: Secondary | ICD-10-CM | POA: Diagnosis not present

## 2019-11-29 ENCOUNTER — Ambulatory Visit: Payer: Self-pay

## 2019-12-01 ENCOUNTER — Ambulatory Visit (INDEPENDENT_AMBULATORY_CARE_PROVIDER_SITE_OTHER): Payer: Medicare Other | Admitting: *Deleted

## 2019-12-01 DIAGNOSIS — I420 Dilated cardiomyopathy: Secondary | ICD-10-CM | POA: Diagnosis not present

## 2019-12-01 LAB — CUP PACEART REMOTE DEVICE CHECK
Battery Remaining Longevity: 168 mo
Battery Remaining Percentage: 100 %
Brady Statistic RV Percent Paced: 0 %
Date Time Interrogation Session: 20201209045100
HighPow Impedance: 68 Ohm
Implantable Lead Implant Date: 20190206
Implantable Lead Location: 753860
Implantable Lead Model: 292
Implantable Lead Serial Number: 438194
Implantable Pulse Generator Implant Date: 20190206
Lead Channel Impedance Value: 557 Ohm
Lead Channel Setting Pacing Amplitude: 2.5 V
Lead Channel Setting Pacing Pulse Width: 0.4 ms
Lead Channel Setting Sensing Sensitivity: 0.5 mV
Pulse Gen Serial Number: 243572

## 2019-12-02 ENCOUNTER — Other Ambulatory Visit: Payer: Self-pay | Admitting: Nurse Practitioner

## 2019-12-02 ENCOUNTER — Ambulatory Visit (INDEPENDENT_AMBULATORY_CARE_PROVIDER_SITE_OTHER): Payer: Medicare Other

## 2019-12-02 DIAGNOSIS — E119 Type 2 diabetes mellitus without complications: Secondary | ICD-10-CM

## 2019-12-02 DIAGNOSIS — Z95810 Presence of automatic (implantable) cardiac defibrillator: Secondary | ICD-10-CM | POA: Diagnosis not present

## 2019-12-02 DIAGNOSIS — E785 Hyperlipidemia, unspecified: Secondary | ICD-10-CM | POA: Diagnosis not present

## 2019-12-02 DIAGNOSIS — I5022 Chronic systolic (congestive) heart failure: Secondary | ICD-10-CM | POA: Diagnosis not present

## 2019-12-02 DIAGNOSIS — Z794 Long term (current) use of insulin: Secondary | ICD-10-CM | POA: Diagnosis not present

## 2019-12-02 DIAGNOSIS — M329 Systemic lupus erythematosus, unspecified: Secondary | ICD-10-CM | POA: Diagnosis not present

## 2019-12-02 DIAGNOSIS — I10 Essential (primary) hypertension: Secondary | ICD-10-CM | POA: Diagnosis not present

## 2019-12-02 LAB — CBC WITH DIFFERENTIAL/PLATELET
Basophils Absolute: 0 10*3/uL (ref 0.0–0.2)
Basos: 0 %
EOS (ABSOLUTE): 0.1 10*3/uL (ref 0.0–0.4)
Eos: 1 %
Hematocrit: 32.7 % — ABNORMAL LOW (ref 34.0–46.6)
Hemoglobin: 10.5 g/dL — ABNORMAL LOW (ref 11.1–15.9)
Immature Grans (Abs): 0.1 10*3/uL (ref 0.0–0.1)
Immature Granulocytes: 1 %
Lymphocytes Absolute: 1.7 10*3/uL (ref 0.7–3.1)
Lymphs: 21 %
MCH: 26.3 pg — ABNORMAL LOW (ref 26.6–33.0)
MCHC: 32.1 g/dL (ref 31.5–35.7)
MCV: 82 fL (ref 79–97)
Monocytes Absolute: 0.5 10*3/uL (ref 0.1–0.9)
Monocytes: 7 %
Neutrophils Absolute: 5.7 10*3/uL (ref 1.4–7.0)
Neutrophils: 70 %
Platelets: 254 10*3/uL (ref 150–450)
RBC: 3.99 x10E6/uL (ref 3.77–5.28)
RDW: 16 % — ABNORMAL HIGH (ref 11.7–15.4)
WBC: 8.1 10*3/uL (ref 3.4–10.8)

## 2019-12-02 NOTE — Progress Notes (Signed)
Ordered

## 2019-12-03 ENCOUNTER — Telehealth: Payer: Self-pay

## 2019-12-03 ENCOUNTER — Telehealth: Payer: Self-pay | Admitting: Pharmacist

## 2019-12-03 LAB — CMP14+EGFR
ALT: 12 IU/L (ref 0–32)
AST: 14 IU/L (ref 0–40)
Albumin/Globulin Ratio: 2.3 — ABNORMAL HIGH (ref 1.2–2.2)
Albumin: 4.5 g/dL (ref 3.8–4.8)
Alkaline Phosphatase: 59 IU/L (ref 39–117)
BUN/Creatinine Ratio: 11 (ref 9–23)
BUN: 11 mg/dL (ref 6–24)
Bilirubin Total: 0.3 mg/dL (ref 0.0–1.2)
CO2: 24 mmol/L (ref 20–29)
Calcium: 9.4 mg/dL (ref 8.7–10.2)
Chloride: 106 mmol/L (ref 96–106)
Creatinine, Ser: 1.04 mg/dL — ABNORMAL HIGH (ref 0.57–1.00)
GFR calc Af Amer: 72 mL/min/{1.73_m2} (ref >59)
GFR calc non Af Amer: 63 mL/min/{1.73_m2} (ref >59)
Globulin, Total: 2 g/dL (ref 1.5–4.5)
Glucose: 138 mg/dL — ABNORMAL HIGH (ref 65–99)
Potassium: 3.2 mmol/L — ABNORMAL LOW (ref 3.5–5.2)
Sodium: 146 mmol/L — ABNORMAL HIGH (ref 134–144)
Total Protein: 6.5 g/dL (ref 6.0–8.5)

## 2019-12-03 LAB — LIPID PANEL
Chol/HDL Ratio: 3.1 ratio (ref 0.0–4.4)
Cholesterol, Total: 162 mg/dL (ref 100–199)
HDL: 52 mg/dL (ref >39)
LDL Chol Calc (NIH): 88 mg/dL (ref 0–99)
Triglycerides: 122 mg/dL (ref 0–149)
VLDL Cholesterol Cal: 22 mg/dL (ref 5–40)

## 2019-12-03 LAB — HEMOGLOBIN A1C
Est. average glucose Bld gHb Est-mCnc: 134 mg/dL
Hgb A1c MFr Bld: 6.3 % — ABNORMAL HIGH (ref 4.8–5.6)

## 2019-12-03 NOTE — Progress Notes (Signed)
EPIC Encounter for ICM Monitoring  Patient Name: Christine Mcdowell is a 48 y.o. female Date: 12/03/2019 Primary Care Physican: [name], MD Primary Cardiologist:Bensimhon Electrophysiologist:Taylor LastWeight:184lbs  Attempted call to patient and unable to reach.  Transmission reviewed.   12/01/2019 [employer] Heart Failure Index5 which is within normal threshold.   Prescribed:Torsemide20 mg take1tablet (20 mg total)Mon, Wed and Friday.Metolazone 2.5 mg takeone tablet as needed for weight gain of 5 lbs within 3 days.   Labs: 12/02/2019 Creatinine 1.04, BUN 11, Potassium 3.2, Sodium 146, GFR 63-72 11/22/2019 Creatinine 1.09, BUN 7,   Potassium 3.2, Sodium 141, GFR 59->60  11/10/2019 Creatinine 0.91, BUN 12, Potassium 3.5, Sodium 143, GFR >60  10/15/2019 Creatinine 0.82, BUN 18, Potassium 4.1, Sodium 140, GFR >60 08/18/2019 Creatinine 1.01, BUN 9,   Potassium 4.3, Sodium 142, GFR >60  08/14/2019 Creatinine 1.03, BUN 10, Potassium 3.6, Sodium 138, GFR >60  08/04/2019 Creatinine 0.75, BUN 16, Potassium 3.7, Sodium 140, GFR >60  A complete set of results can be found in Results Review.  Recommendations:  Unable to reach.    Follow-up plan: ICM clinic phone appointment on 01/03/2020   91 day device clinic remote transmission 03/01/2020.          Copy of ICM check sent to Dr. Annie Sable, RN 12/03/2019 4:07 PM

## 2019-12-03 NOTE — Telephone Encounter (Signed)
Remote ICM transmission received.  Attempted call to patient regarding ICM remote transmission and left message to return call   

## 2019-12-06 ENCOUNTER — Encounter (HOSPITAL_COMMUNITY): Payer: Self-pay

## 2019-12-06 ENCOUNTER — Telehealth: Payer: Self-pay

## 2019-12-06 ENCOUNTER — Ambulatory Visit: Payer: Self-pay | Admitting: Pharmacist

## 2019-12-06 DIAGNOSIS — Z794 Long term (current) use of insulin: Secondary | ICD-10-CM

## 2019-12-06 DIAGNOSIS — E119 Type 2 diabetes mellitus without complications: Secondary | ICD-10-CM

## 2019-12-06 NOTE — Patient Instructions (Signed)
Visit Information  Goals Addressed            This Visit's Progress     Patient Stated   . I need help getting a replacement Dexcom scanner glucometer (pt-stated)       Current Barriers:  . Issues replacing receiver due to insurance guidelines . Financial limitations to cover out of pocket costs  CCM PharmD Clinical Goal(s):  Marland Kitchen Over the next 60 days the patient will work with PCP and PharmD to address glucose monitoring system needs.  CCM PharmD Interventions: Completed call on 12/06/2019 . Comprehensive medication review performed.  Updated medication list in the EMR. . Determined the patient needs assistance in obtaining a new receiver for her DexCom G6. The patient obtained this monitoring system less than one year ago. Unfortunately, approximately two months ago the receiver was thrown away o [employer] for Dexcom submitted to [facility] on 11/09/19 as they are able to process DME/HME through part B--denied** o Patient requested the Dexcom (receiver/transmitter) to be billed under her Medicaid/Care dual SNP plan-->will attempt - Account set up with [facility]--DTO to be faxed to PCP, PharmD to fill out - Additional paperwork faxed o She is able to refill the sensors.  She does not need RX for sensors at this time o Patient continues to check BG ~6-8 times per day due to steroids and sliding scale insulin regimen . Assessed for patients ability to check blood sugar readings at this time   CCM RN CM Interventions  11/04/19 completed call with patient   . Collaboration with embedded Pharm D Lottie Dawson regarding Dexcom status . Discussed Almyra Free will request authorization from Medicare to provide the [facility] transmitter . Discussed with patient she will continue to Self check CBG's with manual glucometer and record all readings . Reiterated best glucose control should reflect FBS at 80-130 . Reviewed and discussed indication, dosage and frequency of  patient's current insulin and oral antihyperglycemic medications; reinforced importance of taking these medications exactly as prescribed w/o missed doses  . Patient Self Care Activities:  . Self administers medications as prescribed . Calls pharmacy for medication refills . Calls provider office for new concerns or questions   Please see past updates related to this goal by clicking on the "Past Updates" button in the selected goal          Print copy of patient instructions provided.   The care management team will reach out to the patient again over the next 30 days.   SIGNATURE Regina Eck, PharmD, BCPS Clinical Pharmacist, [facility] [place]: [phone]

## 2019-12-06 NOTE — Progress Notes (Signed)
This encounter was created in error - please disregard.

## 2019-12-06 NOTE — Progress Notes (Signed)
Chronic Care Management   Visit Note  12/06/2019 Name: Christine Mcdowell MRN: [id number] DOB: 03/17/1971  Referred by: [name], DNP, FNP Reason for referral : Chronic care management-Diabetes   Christine Mcdowell is a 48 y.o. year old female who is a primary care patient of [name], MD. The CCM team was consulted for assistance with chronic disease management and care coordination needs related to DMII  Review of patient status, including review of consultants reports, relevant laboratory and other test results, and collaboration with appropriate care team members and the patient's provider was performed as part of comprehensive patient evaluation and provision of chronic care management services.     Medications: Outpatient Encounter Medications as of 12/06/2019  Medication Sig  . Abatacept (ORENCIA) 125 MG/ML SOSY Inject into the skin once a week.  Marland Kitchen ACCU-CHEK FASTCLIX LANCETS MISC USE TO PRICK FINGER TO TEST BLOOD SUGAR BEFORE BREAKFAST AND DINNER  . albuterol (ACCUNEB) 1.25 MG/3ML nebulizer solution Take 3 mLs (1.25 mg total) by nebulization every 6 (six) hours as needed for wheezing.  Marland Kitchen aspirin 81 MG EC tablet Take by mouth daily. Taking 2 tablets  . atorvastatin (LIPITOR) 10 MG tablet TAKE 1 TABLET(10 MG) BY MOUTH DAILY  . Azelastine HCl 137 MCG/SPRAY SOLN Place 2 sprays into the nose daily.  . Bacillus Coagulans-Inulin (PROBIOTIC) 1-250 BILLION-MG CAPS Take 1 capsule by mouth daily.  . blood glucose meter kit and supplies KIT Dispense based on patient and insurance preference. Use up to four times daily as directed. (FOR ICD-9 250.00, 250.01). FILL BASED ON INSURANCE PREFERENCE.  Marland Kitchen Blood Glucose Monitoring Suppl (ACCU-CHEK AVIVA PLUS) w/Device KIT DX: E11.9. Use as directed to test blood sugar  . carvedilol (COREG) 25 MG tablet TAKE 1 TABLET(25 MG) BY MOUTH TWICE DAILY WITH A MEAL  . Continuous Blood Gluc Receiver (DEXCOM G6 RECEIVER) DEVI 1 Device by Does not apply  route 3 (three) times daily before meals.  . Continuous Blood Gluc Sensor (DEXCOM G6 SENSOR) MISC Inject 1 each into the skin 3 (three) times daily.  . Continuous Blood Gluc Transmit (DEXCOM G6 TRANSMITTER) MISC 1 Device by Does not apply route 3 (three) times daily before meals.  . dapagliflozin propanediol (FARXIGA) 10 MG TABS tablet Take 10 mg by mouth daily before breakfast.  . diclofenac sodium (VOLTAREN) 1 % GEL Apply 1 g topically as needed.  . dicyclomine (BENTYL) 20 MG tablet Take 1 tablet (20 mg total) by mouth 3 (three) times daily as needed for spasms.  . folic acid (FOLVITE) 1 MG tablet TAKE 1 TABLET BY MOUTH EVERY MORNING (Patient taking differently: Take 1 mg by mouth daily. )  . glucose blood (ACCU-CHEK GUIDE) test strip DX: E11.9 Use as instructed to test blood sugars 3-4 times daily.  . hydrALAZINE (APRESOLINE) 50 MG tablet Take 1.5 tablets (75 mg total) by mouth 3 (three) times daily.  Marland Kitchen HYDROcodone-acetaminophen (NORCO) 5-325 MG tablet Take 1 tablet by mouth every 4 (four) hours as needed (for pain).  . hydroxychloroquine (PLAQUENIL) 200 MG tablet Take 1 tablet (200 mg total) by mouth 2 (two) times daily.  Marland Kitchen ibuprofen (ADVIL) 600 MG tablet Take 1 tablet (600 mg total) by mouth every 6 (six) hours as needed.  Marland Kitchen ipratropium (ATROVENT) 0.03 % nasal spray USE 2 SPRAYS IN EACH NOSTRIL THREE TIMES DAILY AS NEEDED FOR RHINITIS (Patient not taking: Reported on 11/24/2019)  . Magnesium 200 MG TABS Take 1 tablet by mouth daily.  . meloxicam (MOBIC) 15 MG tablet Take  15 mg by mouth daily.   . metFORMIN (GLUCOPHAGE) 500 MG tablet TAKE 1 TABLET BY MOUTH EVERY DAY WITH THE MORNING AND EVENING MEAL  . methotrexate (RHEUMATREX) 2.5 MG tablet Take 20 mg by mouth every Friday.   . metolazone (ZAROXOLYN) 2.5 MG tablet TAKE 1 TABLET BY MOUTH AS NEEDED FOR WEIGHT GAIN OF 5 POUNDS WITHIN 3 DAYS AS DIRECTED  . mometasone (NASONEX) 50 MCG/ACT nasal spray Place 2 sprays into the nose daily.  .  montelukast (SINGULAIR) 10 MG tablet TAKE 1 TABLET BY MOUTH DAILY  . Multiple Vitamin (MULTIVITAMIN WITH MINERALS) TABS Take 1 tablet by mouth every morning.   Marland Kitchen NOVOLOG FLEXPEN 100 UNIT/ML FlexPen INJECT UP TO 17 UNITS THREE TIMES DAILY BY SLIDING SCALE AS DIRECTED  . ondansetron (ZOFRAN) 4 MG tablet Take 1 tablet (4 mg total) by mouth every 8 (eight) hours as needed for nausea or vomiting.  . predniSONE (DELTASONE) 20 MG tablet Take 2 tablets (40 mg total) by mouth daily.  Marland Kitchen PROAIR HFA 108 (90 Base) MCG/ACT inhaler INHALE 2 PUFFS BY MOUTH EVERY DAY AS NEEDED FOR SHORTNESS OF BREATH  . sacubitril-valsartan (ENTRESTO) 97-103 MG Take 1 tablet by mouth 2 (two) times daily.  Marland Kitchen spironolactone (ALDACTONE) 25 MG tablet TAKE 1 TABLET(25 MG) BY MOUTH TWICE DAILY  . torsemide (DEMADEX) 20 MG tablet Take 1 tablet Monday, Wednesday , and Friday  . traMADol (ULTRAM) 50 MG tablet Take 1 tablet (50 mg total) by mouth every 6 (six) hours as needed.  . traZODone (DESYREL) 50 MG tablet Take 50 mg by mouth at bedtime.  . TRESIBA FLEXTOUCH 100 UNIT/ML SOPN FlexTouch Pen INJECT 12 UNIT INTO THE SKIN EVERY NIGHT AT BEDTIME PER INSULIN PROTOCOL (Patient taking differently: Inject 12 Units into the skin. )  . Vitamin D, Ergocalciferol, (DRISDOL) 1.25 MG (50000 UT) CAPS capsule TAKE 1 CAPSULE BY MOUTH 2 TIMES EVERY WEEK   No facility-administered encounter medications on file as of 12/06/2019.     Objective:   Goals Addressed            This Visit's Progress     Patient Stated   . I need help getting a replacement Dexcom scanner glucometer (pt-stated)       Current Barriers:  . Issues replacing receiver due to insurance guidelines . Financial limitations to cover out of pocket costs  CCM PharmD Clinical Goal(s):  Marland Kitchen Over the next 60 days the patient will work with PCP and PharmD to address glucose monitoring system needs.  CCM PharmD Interventions: Completed call on 12/06/2019 . Comprehensive medication  review performed.  Updated medication list in the EMR. . Determined the patient needs assistance in obtaining a new receiver for her DexCom G6. The patient obtained this monitoring system less than one year ago. Unfortunately, approximately two months ago the receiver was thrown away o [employer] for Dexcom submitted to [facility] on 11/09/19 as they are able to process DME/HME through part B--denied** o Patient requested the Dexcom (receiver/transmitter) to be billed under her Medicaid/Care dual SNP plan-->will attempt - Account set up with [facility] [facility]--DTO to be faxed to PCP, PharmD to fill out - Additional paperwork faxed o She is able to refill the sensors.  She does not need RX for sensors at this time o Patient continues to check BG ~6-8 times per day due to steroids and sliding scale insulin regimen . Assessed for patients ability to check blood sugar readings at this time   CCM  RN CM Interventions  11/04/19 completed call with patient   . Collaboration with embedded Pharm D Lottie Dawson regarding Dexcom status . Discussed Almyra Free will request authorization from Medicare to provide the [facility] transmitter . Discussed with patient she will continue to Self check CBG's with manual glucometer and record all readings . Reiterated best glucose control should reflect FBS at 80-130 . Reviewed and discussed indication, dosage and frequency of patient's current insulin and oral antihyperglycemic medications; reinforced importance of taking these medications exactly as prescribed w/o missed doses  . Patient Self Care Activities:  . Self administers medications as prescribed . Calls pharmacy for medication refills . Calls provider office for new concerns or questions   Please see past updates related to this goal by clicking on the "Past Updates" button in the selected goal          Plan:   The care management team will reach out to the patient again over the  next 30 days.   Provider Signature Regina Eck, PharmD, BCPS Clinical Pharmacist, [facility] [place]: [phone]

## 2019-12-07 ENCOUNTER — Encounter (HOSPITAL_COMMUNITY): Payer: Self-pay

## 2019-12-09 ENCOUNTER — Other Ambulatory Visit (HOSPITAL_COMMUNITY): Payer: Self-pay | Admitting: *Deleted

## 2019-12-09 MED ORDER — FLUCONAZOLE 200 MG PO TABS: 200.0000 mg | ORAL_TABLET | Freq: Every day | ORAL | 3 refills | Status: AC

## 2019-12-13 ENCOUNTER — Ambulatory Visit (INDEPENDENT_AMBULATORY_CARE_PROVIDER_SITE_OTHER): Payer: Medicare Other | Admitting: Pharmacist

## 2019-12-13 DIAGNOSIS — I10 Essential (primary) hypertension: Secondary | ICD-10-CM

## 2019-12-13 DIAGNOSIS — Z794 Long term (current) use of insulin: Secondary | ICD-10-CM | POA: Diagnosis not present

## 2019-12-13 DIAGNOSIS — E119 Type 2 diabetes mellitus without complications: Secondary | ICD-10-CM

## 2019-12-13 DIAGNOSIS — E785 Hyperlipidemia, unspecified: Secondary | ICD-10-CM | POA: Diagnosis not present

## 2019-12-14 DIAGNOSIS — Z794 Long term (current) use of insulin: Secondary | ICD-10-CM | POA: Diagnosis not present

## 2019-12-14 DIAGNOSIS — E119 Type 2 diabetes mellitus without complications: Secondary | ICD-10-CM | POA: Diagnosis not present

## 2019-12-14 NOTE — Patient Instructions (Signed)
Visit Information  Goals Addressed            This Visit's Progress     Patient Stated   . I need help getting a replacement Dexcom scanner glucometer (pt-stated)       Current Barriers:  . Issues replacing receiver due to insurance guidelines . Financial limitations to cover out of pocket costs  CCM PharmD Clinical Goal(s):  Marland Kitchen Over the next 60 days the patient will work with PCP and PharmD to address glucose monitoring system needs.  CCM PharmD Interventions: Completed call on 12/09/2019 . Comprehensive medication review performed.  Updated medication list in the EMR. . Determined the patient needs assistance in obtaining a new receiver for her DexCom G6. The patient obtained this monitoring system less than one year ago. Unfortunately, approximately two months ago the receiver was thrown away o [employer] for Dexcom submitted to [facility] on 11/09/19 as they are able to process DME/HME through part B--denied** o Patient requested the Dexcom (receiver/transmitter) to be billed under her Medicaid/Care dual SNP plan-->will attempt - Account set up with [facility]--DTO to be faxed to PCP, PharmD to fill out - Additional paperwork re-faxed (successful transmission) o She is able to refill the sensors.  She does not need RX for sensors at this time o Patient continues to check BG ~6-8 times per day due to steroids and sliding scale insulin regimen . Assessed for patients ability to check blood sugar readings at this time   CCM RN CM Interventions  11/04/19 completed call with patient   . Collaboration with embedded Pharm D Lottie Dawson regarding Dexcom status . Discussed Almyra Free will request authorization from Medicare to provide the [facility] transmitter . Discussed with patient she will continue to Self check CBG's with manual glucometer and record all readings . Reiterated best glucose control should reflect FBS at 80-130 . Reviewed and discussed  indication, dosage and frequency of patient's current insulin and oral antihyperglycemic medications; reinforced importance of taking these medications exactly as prescribed w/o missed doses  . Patient Self Care Activities:  . Self administers medications as prescribed . Calls pharmacy for medication refills . Calls provider office for new concerns or questions   Please see past updates related to this goal by clicking on the "Past Updates" button in the selected goal       . I would like to control my blood sugar (pt-stated)       Current Barriers:  . Diabetes: T2DM; most recent A1c 6.4% on 06/18/19  . Current antihyperglycemic regimen: Tresiba 10 units qHS, Novolog sliding scale, Metformin o Discussed insulin/sliding scale; Patient uses AccuCheck GUIDE glucometer o Working to get patient scanner glucometer-Dexcom through [employer] . Denies hypoglycemic symptoms; denies hyperglycemic symptoms . Current meal patterns: mindful of carbohydrate intake, avoids sugary drinks/desserts . Current exercise: walking as able . Current blood glucose readings: 150-200 (currently on prednisone 20mg  daily--plans to taper-- for autoimmune conditions which presents difficulty managing blodd sugars) . Cardiovascular risk reduction: o Current hypertensive regimen: carvedilol, spironolactone, hydralazine, entresto o Current hyperlipidemia regimen: atorvastatin started 10/07/19 #90DS  Pharmacist Clinical Goal(s):  Marland Kitchen Over the next 90 days, patient with work with PharmD and primary care provider to address needs related to optimized medication management of chronic conditions  Interventions: . Comprehensive medication review performed, medication list updated in electronic medical record.  Reviewed medication fill history via insurance claims data confirming patient appears compliant with having her medications filled on time as prescribed by provider. Marland Kitchen  Reviewed & discussed the following diabetes-related  information with patient: o Continue checking blood sugars as directed o Follow ADA recommended "diabetes-friendly" diet  (reviewed healthy snack/food options) o Reviewed medication purpose/side effects-->patient denies adverse events o Continue taking all medications as prescribed by provider   Patient Self Care Activities:  . Patient will check blood glucose 3-4 times daily, document, and provide at future appointments . Patient will focus on medication adherence by continuing to take medications as prescribed . Patient will take medications as prescribed . Patient will contact provider with any episodes of hypoglycemia . Patient will report any questions or concerns to provider   Please see past updates related to this goal by clicking on the "Past Updates" button in the selected goal         The patient verbalized understanding of instructions provided today and declined a print copy of patient instruction materials.   The care management team will reach out to the patient again over the next 30 days.   SIGNATURE Regina Eck, PharmD, BCPS Clinical Pharmacist, [facility] [place]: [phone]

## 2019-12-14 NOTE — Progress Notes (Signed)
Chronic Care Management    Visit Note  12/13/2019 Name: Christine Mcdowell MRN: [id number] DOB: 26-Aug-1971  Referred by: [name], DNP, FNP Reason for referral : Chronic Care Management   Christine Mcdowell is a 48 y.o. year old female who is a primary care patient of [name], MD. The CCM team was consulted for assistance with chronic disease management and care coordination needs related to DMII  Review of patient status, including review of consultants reports, relevant laboratory and other test results, and collaboration with appropriate care team members and the patient's provider was performed as part of comprehensive patient evaluation and provision of chronic care management services.     Medications: Outpatient Encounter Medications as of 12/13/2019  Medication Sig  . Abatacept (ORENCIA) 125 MG/ML SOSY Inject into the skin once a week.  Marland Kitchen ACCU-CHEK FASTCLIX LANCETS MISC USE TO PRICK FINGER TO TEST BLOOD SUGAR BEFORE BREAKFAST AND DINNER  . albuterol (ACCUNEB) 1.25 MG/3ML nebulizer solution Take 3 mLs (1.25 mg total) by nebulization every 6 (six) hours as needed for wheezing.  Marland Kitchen aspirin 81 MG EC tablet Take by mouth daily. Taking 2 tablets  . atorvastatin (LIPITOR) 10 MG tablet TAKE 1 TABLET(10 MG) BY MOUTH DAILY  . Azelastine HCl 137 MCG/SPRAY SOLN Place 2 sprays into the nose daily.  . Bacillus Coagulans-Inulin (PROBIOTIC) 1-250 BILLION-MG CAPS Take 1 capsule by mouth daily.  . blood glucose meter kit and supplies KIT Dispense based on patient and insurance preference. Use up to four times daily as directed. (FOR ICD-9 250.00, 250.01). FILL BASED ON INSURANCE PREFERENCE.  Marland Kitchen Blood Glucose Monitoring Suppl (ACCU-CHEK AVIVA PLUS) w/Device KIT DX: E11.9. Use as directed to test blood sugar  . carvedilol (COREG) 25 MG tablet TAKE 1 TABLET(25 MG) BY MOUTH TWICE DAILY WITH A MEAL  . Continuous Blood Gluc Receiver (DEXCOM G6 RECEIVER) DEVI 1 Device by Does not apply route 3  (three) times daily before meals.  . Continuous Blood Gluc Sensor (DEXCOM G6 SENSOR) MISC Inject 1 each into the skin 3 (three) times daily.  . Continuous Blood Gluc Transmit (DEXCOM G6 TRANSMITTER) MISC 1 Device by Does not apply route 3 (three) times daily before meals.  . dapagliflozin propanediol (FARXIGA) 10 MG TABS tablet Take 10 mg by mouth daily before breakfast.  . diclofenac sodium (VOLTAREN) 1 % GEL Apply 1 g topically as needed.  . dicyclomine (BENTYL) 20 MG tablet Take 1 tablet (20 mg total) by mouth 3 (three) times daily as needed for spasms.  . folic acid (FOLVITE) 1 MG tablet TAKE 1 TABLET BY MOUTH EVERY MORNING (Patient taking differently: Take 1 mg by mouth daily. )  . glucose blood (ACCU-CHEK GUIDE) test strip DX: E11.9 Use as instructed to test blood sugars 3-4 times daily.  . hydrALAZINE (APRESOLINE) 50 MG tablet Take 1.5 tablets (75 mg total) by mouth 3 (three) times daily.  Marland Kitchen HYDROcodone-acetaminophen (NORCO) 5-325 MG tablet Take 1 tablet by mouth every 4 (four) hours as needed (for pain).  . hydroxychloroquine (PLAQUENIL) 200 MG tablet Take 1 tablet (200 mg total) by mouth 2 (two) times daily.  Marland Kitchen ibuprofen (ADVIL) 600 MG tablet Take 1 tablet (600 mg total) by mouth every 6 (six) hours as needed.  Marland Kitchen ipratropium (ATROVENT) 0.03 % nasal spray USE 2 SPRAYS IN EACH NOSTRIL THREE TIMES DAILY AS NEEDED FOR RHINITIS (Patient not taking: Reported on 11/24/2019)  . Magnesium 200 MG TABS Take 1 tablet by mouth daily.  . meloxicam (MOBIC) 15 MG tablet  Take 15 mg by mouth daily.   . metFORMIN (GLUCOPHAGE) 500 MG tablet TAKE 1 TABLET BY MOUTH EVERY DAY WITH THE MORNING AND EVENING MEAL  . methotrexate (RHEUMATREX) 2.5 MG tablet Take 20 mg by mouth every Friday.   . metolazone (ZAROXOLYN) 2.5 MG tablet TAKE 1 TABLET BY MOUTH AS NEEDED FOR WEIGHT GAIN OF 5 POUNDS WITHIN 3 DAYS AS DIRECTED  . mometasone (NASONEX) 50 MCG/ACT nasal spray Place 2 sprays into the nose daily.  . montelukast  (SINGULAIR) 10 MG tablet TAKE 1 TABLET BY MOUTH DAILY  . Multiple Vitamin (MULTIVITAMIN WITH MINERALS) TABS Take 1 tablet by mouth every morning.   Marland Kitchen NOVOLOG FLEXPEN 100 UNIT/ML FlexPen INJECT UP TO 17 UNITS THREE TIMES DAILY BY SLIDING SCALE AS DIRECTED  . ondansetron (ZOFRAN) 4 MG tablet Take 1 tablet (4 mg total) by mouth every 8 (eight) hours as needed for nausea or vomiting.  . predniSONE (DELTASONE) 20 MG tablet Take 2 tablets (40 mg total) by mouth daily.  Marland Kitchen PROAIR HFA 108 (90 Base) MCG/ACT inhaler INHALE 2 PUFFS BY MOUTH EVERY DAY AS NEEDED FOR SHORTNESS OF BREATH  . sacubitril-valsartan (ENTRESTO) 97-103 MG Take 1 tablet by mouth 2 (two) times daily.  Marland Kitchen spironolactone (ALDACTONE) 25 MG tablet TAKE 1 TABLET(25 MG) BY MOUTH TWICE DAILY  . torsemide (DEMADEX) 20 MG tablet Take 1 tablet Monday, Wednesday , and Friday  . traMADol (ULTRAM) 50 MG tablet Take 1 tablet (50 mg total) by mouth every 6 (six) hours as needed.  . traZODone (DESYREL) 50 MG tablet Take 50 mg by mouth at bedtime.  . TRESIBA FLEXTOUCH 100 UNIT/ML SOPN FlexTouch Pen INJECT 12 UNIT INTO THE SKIN EVERY NIGHT AT BEDTIME PER INSULIN PROTOCOL (Patient taking differently: Inject 12 Units into the skin. )  . Vitamin D, Ergocalciferol, (DRISDOL) 1.25 MG (50000 UT) CAPS capsule TAKE 1 CAPSULE BY MOUTH 2 TIMES EVERY WEEK   No facility-administered encounter medications on file as of 12/13/2019.     Objective:   Goals Addressed            This Visit's Progress     Patient Stated   . I need help getting a replacement Dexcom scanner glucometer (pt-stated)       Current Barriers:  . Issues replacing receiver due to insurance guidelines . Financial limitations to cover out of pocket costs  CCM PharmD Clinical Goal(s):  Marland Kitchen Over the next 60 days the patient will work with PCP and PharmD to address glucose monitoring system needs.  CCM PharmD Interventions: Completed call on 12/09/2019 . Comprehensive medication review  performed.  Updated medication list in the EMR. . Determined the patient needs assistance in obtaining a new receiver for her DexCom G6. The patient obtained this monitoring system less than one year ago. Unfortunately, approximately two months ago the receiver was thrown away o [employer] for Dexcom submitted to [facility] on 11/09/19 as they are able to process DME/HME through part B--denied** o Patient requested the Dexcom (receiver/transmitter) to be billed under her Medicaid/Care dual SNP plan-->will attempt - Account set up with [facility]--DTO to be faxed to PCP, PharmD to fill out - Additional paperwork re-faxed (successful transmission) o She is able to refill the sensors.  She does not need RX for sensors at this time o Patient continues to check BG ~6-8 times per day due to steroids and sliding scale insulin regimen . Assessed for patients ability to check blood sugar readings at this time  CCM RN CM Interventions  11/04/19 completed call with patient   . Collaboration with embedded Pharm D Lottie Dawson regarding Dexcom status . Discussed Almyra Free will request authorization from Medicare to provide the [facility] transmitter . Discussed with patient she will continue to Self check CBG's with manual glucometer and record all readings . Reiterated best glucose control should reflect FBS at 80-130 . Reviewed and discussed indication, dosage and frequency of patient's current insulin and oral antihyperglycemic medications; reinforced importance of taking these medications exactly as prescribed w/o missed doses  . Patient Self Care Activities:  . Self administers medications as prescribed . Calls pharmacy for medication refills . Calls provider office for new concerns or questions   Please see past updates related to this goal by clicking on the "Past Updates" button in the selected goal       . I would like to control my blood sugar (pt-stated)        Current Barriers:  . Diabetes: T2DM; most recent A1c 6.4% on 06/18/19  . Current antihyperglycemic regimen: Tresiba 10 units qHS, Novolog sliding scale, Metformin o Discussed insulin/sliding scale; Patient uses AccuCheck GUIDE glucometer o Working to get patient scanner glucometer-Dexcom through [employer] . Denies hypoglycemic symptoms; denies hyperglycemic symptoms . Current meal patterns: mindful of carbohydrate intake, avoids sugary drinks/desserts . Current exercise: walking as able . Current blood glucose readings: 150-200 (currently on prednisone 29m daily--plans to taper-- for autoimmune conditions which presents difficulty managing blodd sugars) . Cardiovascular risk reduction: o Current hypertensive regimen: carvedilol, spironolactone, hydralazine, entresto o Current hyperlipidemia regimen: atorvastatin started 10/07/19 #90DS  Pharmacist Clinical Goal(s):  .Marland KitchenOver the next 90 days, patient with work with PharmD and primary care provider to address needs related to optimized medication management of chronic conditions  Interventions: . Comprehensive medication review performed, medication list updated in electronic medical record.  Reviewed medication fill history via insurance claims data confirming patient appears compliant with having her medications filled on time as prescribed by provider. . Reviewed & discussed the following diabetes-related information with patient: o Continue checking blood sugars as directed o Follow ADA recommended "diabetes-friendly" diet  (reviewed healthy snack/food options) o Reviewed medication purpose/side effects-->patient denies adverse events o Continue taking all medications as prescribed by provider   Patient Self Care Activities:  . Patient will check blood glucose 3-4 times daily, document, and provide at future appointments . Patient will focus on medication adherence by continuing to take medications as prescribed . Patient will take  medications as prescribed . Patient will contact provider with any episodes of hypoglycemia . Patient will report any questions or concerns to provider   Please see past updates related to this goal by clicking on the "Past Updates" button in the selected goal          Plan:   The care management team will reach out to the patient again over the next 30 days.   Provider Signature JRegina Eck PharmD, BCPS Clinical Pharmacist, [facility] [place] [phone]

## 2019-12-15 ENCOUNTER — Other Ambulatory Visit: Payer: Self-pay | Admitting: *Deleted

## 2019-12-15 DIAGNOSIS — Z794 Long term (current) use of insulin: Secondary | ICD-10-CM | POA: Diagnosis not present

## 2019-12-20 ENCOUNTER — Other Ambulatory Visit: Payer: Self-pay

## 2019-12-20 DIAGNOSIS — M545 Low back pain, unspecified: Secondary | ICD-10-CM

## 2019-12-20 MED ORDER — PROAIR HFA 108 (90 BASE) MCG/ACT IN AERS: INHALATION_SPRAY | RESPIRATORY_TRACT | 0 refills | Status: DC

## 2019-12-20 MED ORDER — METRONIDAZOLE 0.75 % VA GEL: VAGINAL | 2 refills | Status: DC

## 2019-12-20 NOTE — Telephone Encounter (Signed)
Med: Proair HFA Last OV: 10/01/2018 Next OV: Nothing scheduled Last filled by Dr. Melvyn Novas on 09/14/19 w/0 refills.   Dr. Melvyn Novas please advise

## 2019-12-21 ENCOUNTER — Other Ambulatory Visit: Payer: Self-pay | Admitting: Nurse Practitioner

## 2019-12-21 DIAGNOSIS — M545 Low back pain, unspecified: Secondary | ICD-10-CM

## 2019-12-21 MED ORDER — TRAMADOL HCL 50 MG PO TABS: 50.0000 mg | ORAL_TABLET | Freq: Four times a day (QID) | ORAL | 0 refills | Status: DC | PRN

## 2019-12-22 ENCOUNTER — Other Ambulatory Visit: Payer: Self-pay | Admitting: Nurse Practitioner

## 2019-12-31 ENCOUNTER — Telehealth: Payer: Self-pay

## 2020-01-03 ENCOUNTER — Ambulatory Visit (INDEPENDENT_AMBULATORY_CARE_PROVIDER_SITE_OTHER): Payer: Medicare Other

## 2020-01-03 ENCOUNTER — Ambulatory Visit (INDEPENDENT_AMBULATORY_CARE_PROVIDER_SITE_OTHER): Payer: Medicare Other | Admitting: Pharmacist

## 2020-01-03 DIAGNOSIS — Z95810 Presence of automatic (implantable) cardiac defibrillator: Secondary | ICD-10-CM

## 2020-01-03 DIAGNOSIS — I5022 Chronic systolic (congestive) heart failure: Secondary | ICD-10-CM

## 2020-01-03 DIAGNOSIS — E119 Type 2 diabetes mellitus without complications: Secondary | ICD-10-CM | POA: Diagnosis not present

## 2020-01-03 DIAGNOSIS — Z794 Long term (current) use of insulin: Secondary | ICD-10-CM | POA: Diagnosis not present

## 2020-01-03 NOTE — Progress Notes (Signed)
Chronic Care Management   Visit Note  01/03/2020 Name: Christine Mcdowell MRN: [id number] DOB: 04-03-1971  Referred by: [name], MD Reason for referral : Chronic Care Management (Diabetes)   Christine Mcdowell is a 49 y.o. year old female who is a primary care patient of [name], MD. The CCM team was consulted for assistance with chronic disease management and care coordination needs related to DMII  Review of patient status, including review of consultants reports, relevant laboratory and other test results, and collaboration with appropriate care team members and the patient's provider was performed as part of comprehensive patient evaluation and provision of chronic care management services.    I contacted Christine Mcdowell by telephone today.  Medications: Outpatient Encounter Medications as of 01/03/2020  Medication Sig  . Abatacept (ORENCIA) 125 MG/ML SOSY Inject into the skin once a week.  Marland Kitchen ACCU-CHEK FASTCLIX LANCETS MISC USE TO PRICK FINGER TO TEST BLOOD SUGAR BEFORE BREAKFAST AND DINNER  . albuterol (ACCUNEB) 1.25 MG/3ML nebulizer solution Take 3 mLs (1.25 mg total) by nebulization every 6 (six) hours as needed for wheezing.  Marland Kitchen aspirin 81 MG EC tablet Take by mouth daily. Taking 2 tablets  . atorvastatin (LIPITOR) 10 MG tablet TAKE 1 TABLET(10 MG) BY MOUTH DAILY  . Azelastine HCl 137 MCG/SPRAY SOLN Place 2 sprays into the nose daily.  . Bacillus Coagulans-Inulin (PROBIOTIC) 1-250 BILLION-MG CAPS Take 1 capsule by mouth daily.  . blood glucose meter kit and supplies KIT Dispense based on patient and insurance preference. Use up to four times daily as directed. (FOR ICD-9 250.00, 250.01). FILL BASED ON INSURANCE PREFERENCE.  Marland Kitchen Blood Glucose Monitoring Suppl (ACCU-CHEK AVIVA PLUS) w/Device KIT DX: E11.9. Use as directed to test blood sugar  . carvedilol (COREG) 25 MG tablet TAKE 1 TABLET(25 MG) BY MOUTH TWICE DAILY WITH A MEAL  . Continuous Blood Gluc Receiver (DEXCOM G6  RECEIVER) DEVI 1 Device by Does not apply route 3 (three) times daily before meals.  . Continuous Blood Gluc Sensor (DEXCOM G6 SENSOR) MISC Inject 1 each into the skin 3 (three) times daily.  . Continuous Blood Gluc Transmit (DEXCOM G6 TRANSMITTER) MISC 1 Device by Does not apply route 3 (three) times daily before meals.  . dapagliflozin propanediol (FARXIGA) 10 MG TABS tablet Take 10 mg by mouth daily before breakfast.  . diclofenac sodium (VOLTAREN) 1 % GEL Apply 1 g topically as needed.  . dicyclomine (BENTYL) 20 MG tablet Take 1 tablet (20 mg total) by mouth 3 (three) times daily as needed for spasms.  . folic acid (FOLVITE) 1 MG tablet TAKE 1 TABLET BY MOUTH EVERY MORNING (Patient taking differently: Take 1 mg by mouth daily. )  . glucose blood (ACCU-CHEK GUIDE) test strip DX: E11.9 Use as instructed to test blood sugars 3-4 times daily.  . hydrALAZINE (APRESOLINE) 50 MG tablet Take 1.5 tablets (75 mg total) by mouth 3 (three) times daily.  Marland Kitchen HYDROcodone-acetaminophen (NORCO) 5-325 MG tablet Take 1 tablet by mouth every 4 (four) hours as needed (for pain).  . hydroxychloroquine (PLAQUENIL) 200 MG tablet Take 1 tablet (200 mg total) by mouth 2 (two) times daily.  Marland Kitchen ibuprofen (ADVIL) 600 MG tablet Take 1 tablet (600 mg total) by mouth every 6 (six) hours as needed.  Marland Kitchen ipratropium (ATROVENT) 0.03 % nasal spray USE 2 SPRAYS IN EACH NOSTRIL THREE TIMES DAILY AS NEEDED FOR RHINITIS (Patient not taking: Reported on 11/24/2019)  . Magnesium 200 MG TABS Take 1 tablet by mouth daily.  Marland Kitchen  meloxicam (MOBIC) 15 MG tablet Take 15 mg by mouth daily.   . metFORMIN (GLUCOPHAGE) 500 MG tablet TAKE 1 TABLET BY MOUTH EVERY DAY WITH THE MORNING AND EVENING MEAL  . methotrexate (RHEUMATREX) 2.5 MG tablet Take 20 mg by mouth every Friday.   . metolazone (ZAROXOLYN) 2.5 MG tablet TAKE 1 TABLET BY MOUTH AS NEEDED FOR WEIGHT GAIN OF 5 POUNDS WITHIN 3 DAYS AS DIRECTED  . metroNIDAZOLE (METROGEL) 0.75 % vaginal gel Use  vaginally every night for 5 nights.  . mometasone (NASONEX) 50 MCG/ACT nasal spray Place 2 sprays into the nose daily.  . montelukast (SINGULAIR) 10 MG tablet TAKE 1 TABLET BY MOUTH DAILY  . Multiple Vitamin (MULTIVITAMIN WITH MINERALS) TABS Take 1 tablet by mouth every morning.   Marland Kitchen NOVOLOG FLEXPEN 100 UNIT/ML FlexPen INJECT UP TO 17 UNITS THREE TIMES DAILY BY SLIDING SCALE AS DIRECTED  . ondansetron (ZOFRAN) 4 MG tablet Take 1 tablet (4 mg total) by mouth every 8 (eight) hours as needed for nausea or vomiting.  . predniSONE (DELTASONE) 20 MG tablet Take 2 tablets (40 mg total) by mouth daily.  Marland Kitchen PROAIR HFA 108 (90 Base) MCG/ACT inhaler INHALE 2 PUFFS BY MOUTH EVERY DAY AS NEEDED FOR SHORTNESS OF BREATH  . sacubitril-valsartan (ENTRESTO) 97-103 MG Take 1 tablet by mouth 2 (two) times daily.  Marland Kitchen spironolactone (ALDACTONE) 25 MG tablet TAKE 1 TABLET(25 MG) BY MOUTH TWICE DAILY  . torsemide (DEMADEX) 20 MG tablet Take 1 tablet Monday, Wednesday , and Friday  . traMADol (ULTRAM) 50 MG tablet Take 1 tablet (50 mg total) by mouth every 6 (six) hours as needed.  . traZODone (DESYREL) 50 MG tablet Take 50 mg by mouth at bedtime.  . TRESIBA FLEXTOUCH 100 UNIT/ML SOPN FlexTouch Pen INJECT 12 UNIT INTO THE SKIN EVERY NIGHT AT BEDTIME PER INSULIN PROTOCOL (Patient taking differently: Inject 12 Units into the skin. )  . Vitamin D, Ergocalciferol, (DRISDOL) 1.25 MG (50000 UT) CAPS capsule TAKE 1 CAPSULE BY MOUTH 2 TIMES EVERY WEEK   No facility-administered encounter medications on file as of 01/03/2020.     Objective:   Goals Addressed            This Visit's Progress     Patient Stated   . I need help getting a replacement Dexcom scanner glucometer (pt-stated)       Current Barriers:  . Issues replacing receiver due to insurance guidelines . Financial limitations to cover out of pocket costs  CCM PharmD Clinical Goal(s):  Marland Kitchen Over the next 60 days the patient will work with PCP and PharmD to  address glucose monitoring system needs.  CCM PharmD Interventions: Completed call on 01/03/2020 . Comprehensive medication review performed.  Updated medication list in the EMR. . Determined the patient needs assistance in obtaining a new receiver for her DexCom G6. The patient obtained this monitoring system less than one year ago. Unfortunately, approximately two months ago the receiver was thrown away o Patient requested the Dexcom (receiver/transmitter) to be billed under her Medicaid/Care dual SNP plan-->will attempt - Account set up with [employer]--DTO completed (new DTO due in April 2021 for refills) - Additional paperwork re-faxed (successful transmission) - Patient recevied delivery on 12/13/2020--encouraged patient to call back as needed for additional Dexcom support o She is able to refill the sensors.  She does not need RX for sensors at this time o Patient continues to check BG ~6-8 times per day due to steroids and sliding scale insulin  regimen . Assessed for patients ability to check blood sugar readings at this time   CCM RN CM Interventions  11/04/19 completed call with patient   . Collaboration with embedded Pharm D Lottie Dawson regarding Dexcom status . Discussed Almyra Free will request authorization from Medicare to provide the [facility] transmitter . Discussed with patient she will continue to Self check CBG's with manual glucometer and record all readings . Reiterated best glucose control should reflect FBS at 80-130 . Reviewed and discussed indication, dosage and frequency of patient's current insulin and oral antihyperglycemic medications; reinforced importance of taking these medications exactly as prescribed w/o missed doses  . Patient Self Care Activities:  . Self administers medications as prescribed . Calls pharmacy for medication refills . Calls provider office for new concerns or questions   Please see past updates related to this goal by clicking on the  "Past Updates" button in the selected goal       . I would like to control my blood sugar (pt-stated)       Current Barriers:  . Diabetes: T2DM; most recent A1c 6/3% on 12/02/19  . Current antihyperglycemic regimen: Tresiba 10 units qHS, Novolog sliding scale, Metformin o Discussed insulin/sliding scale; Patient uses AccuCheck GUIDE glucometer o Working to get patient scanner glucometer-Dexcom through [employer] . Denies hypoglycemic symptoms; denies hyperglycemic symptoms . Current meal patterns: mindful of carbohydrate intake, avoids sugary drinks/desserts . Current exercise: walking as able . Current blood glucose readings: 150-200 (currently on prednisone '20mg'$  daily--plans to taper-- for autoimmune conditions which presents difficulty managing blodd sugars) . Cardiovascular risk reduction: o Current hypertensive regimen: carvedilol, spironolactone, hydralazine, entresto o Current hyperlipidemia regimen: atorvastatin started 10/07/19 #90DS  Pharmacist Clinical Goal(s):  Marland Kitchen Over the next 90 days, patient with work with PharmD and primary care provider to address needs related to optimized medication management of chronic conditions  Interventions: . Comprehensive medication review performed, medication list updated in electronic medical record.   . Reviewed & discussed the following diabetes-related information with patient: o Continue checking blood sugars as directed o Encouraged & reviewed ADA recommended "diabetes-friendly" diet  (reviewed healthy snack/food options) o Reviewed medication purpose/side effects-->patient denies adverse events   Patient Self Care Activities:  . Patient will check blood glucose 3-4 times daily, document, and provide at future appointments . Patient will focus on medication adherence by continuing to take medications as prescribed . Patient will take medications as prescribed . Patient will contact provider with any episodes of hypoglycemia . Patient  will report any questions or concerns to provider   Please see past updates related to this goal by clicking on the "Past Updates" button in the selected goal           Plan:   The care management team will reach out to the patient again over the next 60 days.   Provider Signature Regina Eck, PharmD, BCPS Clinical Pharmacist, [facility] [place]: [phone]   '

## 2020-01-03 NOTE — Patient Instructions (Signed)
Visit Information  Goals Addressed            This Visit's Progress     Patient Stated   . I need help getting a replacement Dexcom scanner glucometer (pt-stated)       Current Barriers:  . Issues replacing receiver due to insurance guidelines . Financial limitations to cover out of pocket costs  CCM PharmD Clinical Goal(s):  Marland Kitchen Over the next 60 days the patient will work with PCP and PharmD to address glucose monitoring system needs.  CCM PharmD Interventions: Completed call on 01/03/2020 . Comprehensive medication review performed.  Updated medication list in the EMR. . Determined the patient needs assistance in obtaining a new receiver for her DexCom G6. The patient obtained this monitoring system less than one year ago. Unfortunately, approximately two months ago the receiver was thrown away o Patient requested the Dexcom (receiver/transmitter) to be billed under her Medicaid/Care dual SNP plan-->will attempt - Account set up with [employer]--DTO completed (new DTO due in April 2021 for refills) - Additional paperwork re-faxed (successful transmission) - Patient recevied delivery on 12/13/2020--encouraged patient to call back as needed for additional Dexcom support o She is able to refill the sensors.  She does not need RX for sensors at this time o Patient continues to check BG ~6-8 times per day due to steroids and sliding scale insulin regimen . Assessed for patients ability to check blood sugar readings at this time   CCM RN CM Interventions  11/04/19 completed call with patient   . Collaboration with embedded Pharm D Lottie Dawson regarding Dexcom status . Discussed Almyra Free will request authorization from Medicare to provide the [facility] transmitter . Discussed with patient she will continue to Self check CBG's with manual glucometer and record all readings . Reiterated best glucose control should reflect FBS at 80-130 . Reviewed and discussed indication, dosage and  frequency of patient's current insulin and oral antihyperglycemic medications; reinforced importance of taking these medications exactly as prescribed w/o missed doses  . Patient Self Care Activities:  . Self administers medications as prescribed . Calls pharmacy for medication refills . Calls provider office for new concerns or questions   Please see past updates related to this goal by clicking on the "Past Updates" button in the selected goal       . I would like to control my blood sugar (pt-stated)       Current Barriers:  . Diabetes: T2DM; most recent A1c 6/3% on 12/02/19  . Current antihyperglycemic regimen: Tresiba 10 units qHS, Novolog sliding scale, Metformin o Discussed insulin/sliding scale; Patient uses AccuCheck GUIDE glucometer o Working to get patient scanner glucometer-Dexcom through [employer] . Denies hypoglycemic symptoms; denies hyperglycemic symptoms . Current meal patterns: mindful of carbohydrate intake, avoids sugary drinks/desserts . Current exercise: walking as able . Current blood glucose readings: 150-200 (currently on prednisone 20mg  daily--plans to taper-- for autoimmune conditions which presents difficulty managing blodd sugars) . Cardiovascular risk reduction: o Current hypertensive regimen: carvedilol, spironolactone, hydralazine, entresto o Current hyperlipidemia regimen: atorvastatin started 10/07/19 #90DS  Pharmacist Clinical Goal(s):  Marland Kitchen Over the next 90 days, patient with work with PharmD and primary care provider to address needs related to optimized medication management of chronic conditions  Interventions: . Comprehensive medication review performed, medication list updated in electronic medical record.   . Reviewed & discussed the following diabetes-related information with patient: o Continue checking blood sugars as directed o Encouraged & reviewed ADA recommended "diabetes-friendly" diet  (reviewed healthy  snack/food options) o Reviewed  medication purpose/side effects-->patient denies adverse events   Patient Self Care Activities:  . Patient will check blood glucose 3-4 times daily, document, and provide at future appointments . Patient will focus on medication adherence by continuing to take medications as prescribed . Patient will take medications as prescribed . Patient will contact provider with any episodes of hypoglycemia . Patient will report any questions or concerns to provider   Please see past updates related to this goal by clicking on the "Past Updates" button in the selected goal         The patient verbalized understanding of instructions provided today and declined a print copy of patient instruction materials.   The care management team will reach out to the patient again over the next 60 days.   SIGNATURE Regina Eck, PharmD, BCPS Clinical Pharmacist, [facility] [place]: [phone]

## 2020-01-07 NOTE — Progress Notes (Signed)
EPIC Encounter for ICM Monitoring  Patient Name: Christine Mcdowell is a 49 y.o. female Date: 01/07/2020 Primary Care Physican: [name], MD Primary Cardiologist:Bensimhon Electrophysiologist:Taylor LastWeight:184lbs  Transmission reviewed.  01/03/2020 [employer] Heart Failure Index0which is within normal threshold.   Prescribed:Torsemide20 mg take1tablet (20 mg total)Mon, Wed and Friday.Metolazone 2.5 mg takeone tablet as needed for weight gain of 5 lbs within 3 days.   Labs: 12/02/2019 Creatinine 1.04, BUN 11, Potassium 3.2, Sodium 146, GFR 63-72 11/22/2019 Creatinine 1.09, BUN 7,   Potassium 3.2, Sodium 141, GFR 59->60  11/10/2019 Creatinine 0.91, BUN 12, Potassium 3.5, Sodium 143, GFR >60  10/15/2019 Creatinine0.82, BUN18, Potassium4.1, Sodium140, GFR>60 08/18/2019 Creatinine1.01, BUN9, Potassium 4.3, Sodium142, GFR>60  08/14/2019 Creatinine1.03, BUN10, Potassium3.6, Sodium138, GFR>60  08/04/2019 Creatinine0.75, BUN16, Potassium3.7, Sodium140, GFR>60 A complete set of results can be found in Results Review.  Recommendations: None  Follow-up plan: ICM clinic phone appointment on 02/07/2020.   91 day device clinic remote transmission 03/01/2020.     Copy of ICM check sent to Dr. Lovena Le.   3 month ICM trend: 01/03/2020    1 Year ICM trend:       [name], RN 01/07/2020 1:45 PM

## 2020-01-08 NOTE — Progress Notes (Signed)
ICD remote 

## 2020-01-09 ENCOUNTER — Other Ambulatory Visit: Payer: Self-pay | Admitting: Nurse Practitioner

## 2020-01-14 ENCOUNTER — Encounter (HOSPITAL_COMMUNITY): Payer: Self-pay

## 2020-01-14 DIAGNOSIS — Z794 Long term (current) use of insulin: Secondary | ICD-10-CM | POA: Diagnosis not present

## 2020-01-17 ENCOUNTER — Other Ambulatory Visit (HOSPITAL_COMMUNITY): Payer: Self-pay | Admitting: *Deleted

## 2020-01-17 MED ORDER — FLUCONAZOLE 200 MG PO TABS: ORAL_TABLET | ORAL | 0 refills | Status: DC

## 2020-01-19 ENCOUNTER — Telehealth: Payer: Self-pay

## 2020-01-21 ENCOUNTER — Ambulatory Visit: Payer: Medicare Other | Attending: Internal Medicine

## 2020-01-21 ENCOUNTER — Encounter (HOSPITAL_COMMUNITY): Payer: Self-pay

## 2020-01-21 ENCOUNTER — Encounter: Payer: Self-pay | Admitting: Nurse Practitioner

## 2020-01-21 DIAGNOSIS — Z20822 Contact with and (suspected) exposure to covid-19: Secondary | ICD-10-CM | POA: Diagnosis not present

## 2020-01-22 LAB — NOVEL CORONAVIRUS, NAA: SARS-CoV-2, NAA: NOT DETECTED

## 2020-01-24 DIAGNOSIS — Z7952 Long term (current) use of systemic steroids: Secondary | ICD-10-CM | POA: Diagnosis not present

## 2020-01-24 DIAGNOSIS — M0579 Rheumatoid arthritis with rheumatoid factor of multiple sites without organ or systems involvement: Secondary | ICD-10-CM | POA: Diagnosis not present

## 2020-01-24 DIAGNOSIS — M351 Other overlap syndromes: Secondary | ICD-10-CM | POA: Diagnosis not present

## 2020-01-24 DIAGNOSIS — Z79899 Other long term (current) drug therapy: Secondary | ICD-10-CM | POA: Diagnosis not present

## 2020-01-24 DIAGNOSIS — M329 Systemic lupus erythematosus, unspecified: Secondary | ICD-10-CM | POA: Diagnosis not present

## 2020-01-28 ENCOUNTER — Encounter (HOSPITAL_COMMUNITY): Payer: Self-pay

## 2020-01-31 ENCOUNTER — Telehealth: Payer: Self-pay | Admitting: Cardiology

## 2020-01-31 ENCOUNTER — Emergency Department (HOSPITAL_BASED_OUTPATIENT_CLINIC_OR_DEPARTMENT_OTHER): Payer: Medicare Other

## 2020-01-31 ENCOUNTER — Emergency Department (HOSPITAL_COMMUNITY)
Admission: EM | Admit: 2020-01-31 | Discharge: 2020-01-31 | Discharge status: Against Medical Advice | Payer: Medicare Other

## 2020-01-31 ENCOUNTER — Encounter (HOSPITAL_BASED_OUTPATIENT_CLINIC_OR_DEPARTMENT_OTHER): Payer: Self-pay | Admitting: *Deleted

## 2020-01-31 ENCOUNTER — Emergency Department (HOSPITAL_BASED_OUTPATIENT_CLINIC_OR_DEPARTMENT_OTHER)
Admission: EM | Admit: 2020-01-31 | Discharge: 2020-01-31 | Disposition: A | Discharge status: Home / Self Care | Payer: Medicare Other | Attending: Emergency Medicine | Admitting: Emergency Medicine

## 2020-01-31 ENCOUNTER — Other Ambulatory Visit: Payer: Self-pay

## 2020-01-31 DIAGNOSIS — R079 Chest pain, unspecified: Secondary | ICD-10-CM | POA: Diagnosis not present

## 2020-01-31 DIAGNOSIS — R0789 Other chest pain: Secondary | ICD-10-CM | POA: Diagnosis present

## 2020-01-31 DIAGNOSIS — Z7902 Long term (current) use of antithrombotics/antiplatelets: Secondary | ICD-10-CM | POA: Diagnosis not present

## 2020-01-31 DIAGNOSIS — I110 Hypertensive heart disease with heart failure: Secondary | ICD-10-CM | POA: Diagnosis not present

## 2020-01-31 DIAGNOSIS — J45909 Unspecified asthma, uncomplicated: Secondary | ICD-10-CM | POA: Diagnosis not present

## 2020-01-31 DIAGNOSIS — Z8673 Personal history of transient ischemic attack (TIA), and cerebral infarction without residual deficits: Secondary | ICD-10-CM | POA: Diagnosis not present

## 2020-01-31 DIAGNOSIS — Z7982 Long term (current) use of aspirin: Secondary | ICD-10-CM | POA: Diagnosis not present

## 2020-01-31 DIAGNOSIS — Z7984 Long term (current) use of oral hypoglycemic drugs: Secondary | ICD-10-CM | POA: Insufficient documentation

## 2020-01-31 DIAGNOSIS — M3210 Systemic lupus erythematosus, organ or system involvement unspecified: Secondary | ICD-10-CM | POA: Insufficient documentation

## 2020-01-31 DIAGNOSIS — Z881 Allergy status to other antibiotic agents status: Secondary | ICD-10-CM | POA: Insufficient documentation

## 2020-01-31 DIAGNOSIS — R002 Palpitations: Secondary | ICD-10-CM | POA: Diagnosis not present

## 2020-01-31 DIAGNOSIS — E119 Type 2 diabetes mellitus without complications: Secondary | ICD-10-CM | POA: Insufficient documentation

## 2020-01-31 DIAGNOSIS — R0602 Shortness of breath: Secondary | ICD-10-CM | POA: Diagnosis not present

## 2020-01-31 DIAGNOSIS — Z79899 Other long term (current) drug therapy: Secondary | ICD-10-CM | POA: Insufficient documentation

## 2020-01-31 DIAGNOSIS — R072 Precordial pain: Secondary | ICD-10-CM | POA: Diagnosis not present

## 2020-01-31 DIAGNOSIS — I5022 Chronic systolic (congestive) heart failure: Secondary | ICD-10-CM | POA: Insufficient documentation

## 2020-01-31 LAB — CBC
HCT: 37.8 % (ref 36.0–46.0)
Hemoglobin: 12 g/dL (ref 12.0–15.0)
MCH: 27.7 pg (ref 26.0–34.0)
MCHC: 31.7 g/dL (ref 30.0–36.0)
MCV: 87.3 fL (ref 80.0–100.0)
Platelets: 277 10*3/uL (ref 150–400)
RBC: 4.33 MIL/uL (ref 3.87–5.11)
RDW: 16.5 % — ABNORMAL HIGH (ref 11.5–15.5)
WBC: 12.2 10*3/uL — ABNORMAL HIGH (ref 4.0–10.5)
nRBC: 0 % (ref 0.0–0.2)

## 2020-01-31 LAB — TROPONIN I (HIGH SENSITIVITY)
Troponin I (High Sensitivity): 2 ng/L (ref <18)
Troponin I (High Sensitivity): <2 ng/L (ref <18)

## 2020-01-31 LAB — BASIC METABOLIC PANEL
Anion gap: 9 (ref 5–15)
BUN: 13 mg/dL (ref 6–20)
CO2: 26 mmol/L (ref 22–32)
Calcium: 9.1 mg/dL (ref 8.9–10.3)
Chloride: 106 mmol/L (ref 98–111)
Creatinine, Ser: 0.88 mg/dL (ref 0.44–1.00)
GFR calc Af Amer: >60 mL/min (ref >60)
GFR calc non Af Amer: >60 mL/min (ref >60)
Glucose, Bld: 232 mg/dL — ABNORMAL HIGH (ref 70–99)
Potassium: 3.4 mmol/L — ABNORMAL LOW (ref 3.5–5.1)
Sodium: 141 mmol/L (ref 135–145)

## 2020-01-31 LAB — PREGNANCY, URINE: Preg Test, Ur: NEGATIVE

## 2020-01-31 LAB — BRAIN NATRIURETIC PEPTIDE: B Natriuretic Peptide: 40.3 pg/mL (ref 0.0–100.0)

## 2020-01-31 MED ORDER — SODIUM CHLORIDE 0.9% FLUSH
3.0000 mL | Freq: Once | INTRAVENOUS | Status: AC
  Administered 2020-01-31: 3 mL via INTRAVENOUS
  Filled 2020-01-31: qty 3

## 2020-01-31 NOTE — ED Provider Notes (Signed)
[facility]ERGENCY DEPARTMENT Provider Note   CSN: [id number] Arrival date & time: 01/31/20  1819     History Chief Complaint  Patient presents with  . Chest Pain    Christine Mcdowell is a 49 y.o. female.  Patient with a known history of chronic systolic heart failure cardiomyopathy and has an ICD implanted followed by Dr. Ronna Polio cardiology.  Patient today at around 4:00 with onset of substernal chest pain that kind of went up to the left neck left arm she felt palpitations she had kind of an anxiety feeling.  Defibrillator did not go off.  Upon arrival here patient still feeling the palpitations but EKG and cardiac monitor without any significant arrhythmias.  She first arrived here she still has some of the chest discomfort but then it went away.  Patient has follow-up with cardiology clinic tomorrow.        Past Medical History:  Diagnosis Date  . AICD (automatic cardioverter/defibrillator) present 01/28/2018  . Anemia   . Anginal pain ([place])   . Asthma   . Cervical cancer ([place])   . CHF (congestive heart failure) ([place])   . Diabetes mellitus without complication ([place])    steroid induced  . Discoid lupus   . Fibromyalgia   . History of blood transfusion "several"   "related to anemia; had some w/hysterectomy also"  . Hx of cardiovascular stress test    ETT-Myoview (9/15):  No ischemia, EF 52%; NORMAL  . Hx of echocardiogram    Echo (9/15):  EF 50-55%, ant HK, Gr 1 DD, mild MR, mild LAE, no effusion  . Hypertension   . Iron deficiency anemia    h/o iron transfusions  . Lupus (systemic lupus erythematosus) ([place])   . Migraine    "a few/year" (07/03/2016)  . Pneumonia 12/2015  . RA (rheumatoid arthritis) ([place])    "all over" (07/03/2016)  . Sickle cell trait ([place])   . Stroke ([place]) 2014 X 1; 2015 X 2; 2016 X 1;    "right side of face more relaxed than the other; rare speech hesitation" (07/03/2016)  . Vaginal Pap smear, abnormal    ASCUS; HPV    Patient  Active Problem List   Diagnosis Date Noted  . Lower abdominal pain 09/07/2019  . MDD (major depressive disorder), single episode, moderate ([place]) 06/23/2019  . Chronic systolic (congestive) heart failure ([place]) 01/28/2018  . Upper airway cough syndrome 01/19/2018  . Pulmonary infiltrates on CXR 01/19/2018  . Migraines 10/08/2017  . Sleep apnea with use of continuous positive airway pressure (CPAP) 09/24/2017  . Congestive heart failure (CHF) ([place]) 10/09/2016  . Rheumatoid arthritis ([place]) 10/09/2016  . Asthma 10/09/2016  . Chronic pain 10/09/2016  . Heart failure ([place]) 10/09/2016  . Congestive heart failure ([place])   . Lupus (systemic lupus erythematosus) ([place])   . Diabetes mellitus with complication ([place])   . Anxiety state   . GERD (gastroesophageal reflux disease) 12/28/2015  . Essential hypertension 12/26/2015  . Type 2 diabetes mellitus ([place]) 12/26/2015  . Chronic systolic heart failure ([place]) 08/18/2014  . 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 Shortness of breath 01/26/2014  . SLE (systemic lupus erythematosus) ([place]) 01/26/2014    Past Surgical History:  Procedure Laterality Date  . ABDOMINAL HYSTERECTOMY  2009  . ABDOMINAL WOUND DEHISCENCE  2009  . CARDIAC CATHETERIZATION N/A 10/11/2016   Procedure: Right/Left Heart Cath and Coronary Angiography;  Surgeon: Jolaine Artist, MD;  Location: [facility];  Service: Cardiovascular;  Laterality: N/A;  . DILATION AND CURETTAGE OF UTERUS  1991  . HEMATOMA EVACUATION  2009   abdomen  . ICD IMPLANT  01/28/2018  . ICD IMPLANT N/A 01/28/2018   Procedure: ICD IMPLANT;  Surgeon: Evans Lance, MD;  Location: [facility];  Service: Cardiovascular;  Laterality: N/A;  . INCISE AND DRAIN ABCESS  2009 X 2   "abdomen after hysterectomy"  . KNEE ARTHROSCOPY Right 1997  . KNEE SURGERY Right   . TUBAL LIGATION  1996     OB History    Gravida  5   Para  4   Term  3   Preterm  1   AB  1   Living  4     SAB  1    TAB      Ectopic      Multiple      Live Births  4           Family History  Problem Relation Age of Onset  . Arthritis Mother   . Heart murmur Mother   . Drug abuse Mother   . Allergies Mother   . Heart attack Father   . Cushing syndrome Father   . Depression Father   . Allergies Father   . Dementia Paternal Grandmother   . Cancer Paternal Grandfather   . Diabetes Maternal Grandmother   . Hypertension Maternal Grandmother   . Asthma Maternal Grandmother   . Heart attack Maternal Grandfather     Social History   Tobacco Use  . Smoking status: Never Smoker  . Smokeless tobacco: Never Used  Substance Use Topics  . Alcohol use: No  . Drug use: No    Home Medications Prior to Admission medications   Medication Sig Start Date End Date Taking? Authorizing Provider  Abatacept (ORENCIA) 125 MG/ML SOSY Inject into the skin once a week.    [provider]  ACCU-CHEK FASTCLIX LANCETS MISC USE TO PRICK FINGER TO TEST BLOOD SUGAR BEFORE BREAKFAST AND DINNER 08/31/18   [provider]  albuterol (ACCUNEB) 1.25 MG/3ML nebulizer solution Take 3 mLs (1.25 mg total) by nebulization every 6 (six) hours as needed for wheezing. 03/04/16   Lacretia Leigh, MD  aspirin 81 MG EC tablet Take by mouth daily. Taking 2 tablets    [provider]  atorvastatin (LIPITOR) 10 MG tablet TAKE 1 TABLET(10 MG) BY MOUTH DAILY 11/23/19   [name], FNP  Azelastine HCl 137 MCG/SPRAY SOLN Place 2 sprays into the nose daily. 10/19/19   [name], FNP  Bacillus Coagulans-Inulin (PROBIOTIC) 1-250 BILLION-MG CAPS Take 1 capsule by mouth daily. 09/07/19   [name], FNP  blood glucose meter kit and supplies KIT Dispense based on patient and insurance preference. Use up to four times daily as directed. (FOR ICD-9 250.00, 250.01). FILL BASED ON INSURANCE PREFERENCE. 08/18/19   [name], FNP  Blood Glucose Monitoring Suppl (ACCU-CHEK AVIVA PLUS) w/Device KIT DX: E11.9. Use  as directed to test blood sugar 09/08/19   [name], FNP  carvedilol (COREG) 25 MG tablet TAKE 1 TABLET(25 MG) BY MOUTH TWICE DAILY WITH A MEAL 06/07/19   Bensimhon, Shaune Pascal, MD  Continuous Blood Gluc Receiver (DEXCOM G6 RECEIVER) DEVI 1 Device by Does not apply route 3 (three) times daily before meals. 11/22/19   [name], FNP  Continuous Blood Gluc Sensor (DEXCOM G6 SENSOR) MISC Inject 1 each into the skin 3 (three) times daily. 11/22/19   [name], [place]  Continuous Blood Gluc Transmit (DEXCOM G6 TRANSMITTER) MISC 1 Device by Does not apply route 3 (three) times daily before meals. 11/22/19   [name], FNP  dapagliflozin propanediol (FARXIGA) 10 MG TABS tablet Take 10 mg by mouth daily before breakfast. 11/10/19   Bensimhon, Shaune Pascal, MD  diclofenac sodium (VOLTAREN) 1 % GEL Apply 1 g topically as needed.    [provider]  dicyclomine (BENTYL) 20 MG tablet Take 1 tablet (20 mg total) by mouth 3 (three) times daily as needed for spasms. 09/07/19 09/06/20  [name], FNP  fluconazole (DIFLUCAN) 200 MG tablet Take 1 tablet daily for three days. 01/17/20   Bensimhon, Shaune Pascal, MD  folic acid (FOLVITE) 1 MG tablet TAKE 1 TABLET BY MOUTH EVERY MORNING Patient taking differently: Take 1 mg by mouth daily.  01/30/17   Bensimhon, Shaune Pascal, MD  glucose blood (ACCU-CHEK GUIDE) test strip DX: E11.9 Use as instructed to test blood sugars 3-4 times daily. 09/27/19   [name], MD  hydrALAZINE (APRESOLINE) 50 MG tablet Take 1.5 tablets (75 mg total) by mouth 3 (three) times daily. 11/10/19   Bensimhon, Shaune Pascal, MD  HYDROcodone-acetaminophen (NORCO) 5-325 MG tablet Take 1 tablet by mouth every 4 (four) hours as needed (for pain). 09/11/19   Molpus, John, MD  hydroxychloroquine (PLAQUENIL) 200 MG tablet Take 1 tablet (200 mg total) by mouth 2 (two) times daily. 02/02/19   Bensimhon, Shaune Pascal, MD  ibuprofen (ADVIL) 600 MG tablet Take 1 tablet (600 mg total) by mouth every 6 (six) hours  as needed. 11/22/19   Horton, Barbette Hair, MD  ipratropium (ATROVENT) 0.03 % nasal spray USE 2 SPRAYS IN EACH NOSTRIL THREE TIMES DAILY AS NEEDED FOR RHINITIS Patient not taking: Reported on 11/24/2019 10/11/19   [name], FNP  Magnesium 200 MG TABS Take 1 tablet by mouth daily.    [provider]  meloxicam (MOBIC) 15 MG tablet Take 15 mg by mouth daily.     [provider]  metFORMIN (GLUCOPHAGE) 500 MG tablet TAKE 1 TABLET BY MOUTH EVERY DAY WITH THE MORNING AND EVENING MEAL 11/04/19   [name], FNP  methotrexate (RHEUMATREX) 2.5 MG tablet Take 20 mg by mouth every Friday.  02/06/15   [provider]  metolazone (ZAROXOLYN) 2.5 MG tablet TAKE 1 TABLET BY MOUTH AS NEEDED FOR WEIGHT GAIN OF 5 POUNDS WITHIN 3 DAYS AS DIRECTED 11/01/19   Bensimhon, Shaune Pascal, MD  metroNIDAZOLE (METROGEL) 0.75 % vaginal gel Use vaginally every night for 5 nights. 12/20/19   Florian Buff, MD  mometasone (NASONEX) 50 MCG/ACT nasal spray Place 2 sprays into the nose daily. 08/24/19 08/23/20  [name], FNP  montelukast (SINGULAIR) 10 MG tablet TAKE 1 TABLET BY MOUTH DAILY 12/22/19   [name], FNP  Multiple Vitamin (MULTIVITAMIN WITH MINERALS) TABS Take 1 tablet by mouth every morning.     [provider]  NOVOLOG FLEXPEN 100 UNIT/ML FlexPen INJECT UP TO 17 UNITS THREE TIMES DAILY BY SLIDING SCALE AS DIRECTED 09/27/19   [name], FNP  ondansetron (ZOFRAN) 4 MG tablet Take 1 tablet (4 mg total) by mouth every 8 (eight) hours as needed for nausea or vomiting. 12/30/15   Robbie Lis, MD  predniSONE (DELTASONE) 20 MG tablet Take 2 tablets (40 mg total) by mouth daily. 10/16/19   Davonna Belling, MD  PROAIR HFA 108 [phone] Base) MCG/ACT inhaler INHALE 2 PUFFS BY MOUTH EVERY DAY AS NEEDED FOR SHORTNESS OF BREATH 12/20/19  Tanda Rockers, MD  sacubitril-valsartan (ENTRESTO) 97-103 MG Take 1 tablet by mouth 2 (two) times daily. 08/04/19   Bensimhon, Shaune Pascal, MD  spironolactone  (ALDACTONE) 25 MG tablet TAKE 1 TABLET(25 MG) BY MOUTH TWICE DAILY 09/24/19   Bensimhon, Shaune Pascal, MD  torsemide [facility]) 20 MG tablet Take 1 tablet Monday, Wednesday , and Friday 08/04/19   Bensimhon, Shaune Pascal, MD  traMADol (ULTRAM) 50 MG tablet Take 1 tablet (50 mg total) by mouth every 6 (six) hours as needed. 12/21/19 12/20/20  [name], FNP  traZODone (DESYREL) 50 MG tablet Take 50 mg by mouth at bedtime.    [provider]  TRESIBA FLEXTOUCH 100 UNIT/ML SOPN FlexTouch Pen INJECT 12 UNIT INTO THE SKIN EVERY NIGHT AT BEDTIME PER INSULIN PROTOCOL Patient taking differently: Inject 12 Units into the skin.  06/30/19   [name], FNP  Vitamin D, Ergocalciferol, (DRISDOL) 1.25 MG (50000 UT) CAPS capsule TAKE 1 CAPSULE BY MOUTH 2 TIMES EVERY WEEK 09/27/19   [name], FNP    Allergies    Hydromorphone, Iodinated diagnostic agents, Other, Erythromycin, Latex, Tape, and Mircette [desogestrel-ethinyl estradiol]  Review of Systems   Review of Systems  Constitutional: Negative for chills and fever.  HENT: Negative for rhinorrhea and sore throat.   Eyes: Negative for visual disturbance.  Respiratory: Negative for cough and shortness of breath.   Cardiovascular: Positive for chest pain and palpitations. Negative for leg swelling.  Gastrointestinal: Negative for abdominal pain, diarrhea, nausea and vomiting.  Genitourinary: Negative for dysuria.  Musculoskeletal: Negative for back pain and neck pain.  Skin: Negative for rash.  Neurological: Negative for dizziness, light-headedness and headaches.  Hematological: Does not bruise/bleed easily.  Psychiatric/Behavioral: Negative for confusion. The patient is nervous/anxious.     Physical Exam Updated Vital Signs BP 125/67   Pulse 83   Temp 98.8 F (37.1 C) (Oral)   Resp 18   Ht 1.676 m ('5\' 6"'$ )   Wt 88.5 kg   SpO2 99%   BMI 31.49 kg/m   Physical Exam Vitals and nursing note reviewed.  Constitutional:      General: She is  not in acute distress.    Appearance: Normal appearance. She is well-developed.  HENT:     Head: Normocephalic and atraumatic.  Eyes:     Extraocular Movements: Extraocular movements intact.     Conjunctiva/sclera: Conjunctivae normal.     Pupils: Pupils are equal, round, and reactive to light.  Cardiovascular:     Rate and Rhythm: Normal rate and regular rhythm.     Heart sounds: No murmur.  Pulmonary:     Effort: Pulmonary effort is normal. No respiratory distress.     Breath sounds: Normal breath sounds.  Chest:     Chest wall: No tenderness.  Abdominal:     Palpations: Abdomen is soft.     Tenderness: There is no abdominal tenderness.  Musculoskeletal:        General: Normal range of motion.     Cervical back: Normal range of motion and neck supple.  Skin:    General: Skin is warm and dry.     Capillary Refill: Capillary refill takes less than 2 seconds.  Neurological:     General: No focal deficit present.     Mental Status: She is alert and oriented to person, place, and time.     Cranial Nerves: No cranial nerve deficit.     Sensory: No sensory deficit.     Motor: No weakness.  ED Results / Procedures / Treatments   Labs (all labs ordered are listed, but only abnormal results are displayed) Labs Reviewed  BASIC METABOLIC PANEL - Abnormal; Notable for the following components:      Result Value   Potassium 3.4 (*)    Glucose, Bld 232 (*)    All other components within normal limits  CBC - Abnormal; Notable for the following components:   WBC 12.2 (*)    RDW 16.5 (*)    All other components within normal limits  PREGNANCY, URINE  BRAIN NATRIURETIC PEPTIDE  TROPONIN I (HIGH SENSITIVITY)  TROPONIN I (HIGH SENSITIVITY)    EKG None   ED ECG REPORT   Date: 01/31/2020  Rate: 86  Rhythm: normal sinus rhythm  QRS Axis: normal  Intervals: normal  ST/T Wave abnormalities: normal  Conduction Disutrbances:none  Narrative Interpretation:   Old EKG  Reviewed: none available Does show evidence of left ventricular hypertrophy.  Probable left atrial enlargement.  But otherwise no acute changes. I have personally reviewed the EKG tracing and agree with the computerized printout as noted.   Radiology DG Chest 2 View  Result Date: 01/31/2020 CLINICAL DATA:  Chest pain, shortness of breath, and left arm pain. EXAM: CHEST - 2 VIEW COMPARISON:  11/22/2019 and 08/14/2019 FINDINGS: Chronic cardiomegaly. AICD in place. Pulmonary vascularity is normal. Lungs are clear. No effusions. Bones are normal. IMPRESSION: No acute abnormality. Chronic cardiomegaly. Electronically Signed   By: Lorriane Shire M.D.   On: 01/31/2020 18:47    Procedures Procedures (including critical care time)  Medications Ordered in ED Medications  sodium chloride flush (NS) 0.9 % injection 3 mL (3 mLs Intravenous Given 01/31/20 1838)    ED Course  I have reviewed the triage vital signs and the nursing notes.  Pertinent labs & imaging results that were available during my care of the patient were reviewed by me and considered in my medical decision making (see chart for details).    MDM Rules/Calculators/A&P                     Chest pain is resolved.  Despite the feeling of palpitations cardiac monitor showed sinus rhythm no tachycardia.  Chest x-ray negative for any pulmonary edema.  BNP is not elevated so no evidence of concern for congestive heart failure.  Troponins x2 are normal.  Feel that some of the symptoms of the palpitations which was ongoing despite normal cardiac monitoring may be a little anxiety related.  I think patient clinically stable for discharge home has follow-up with cardiology clinic in the morning.  She will return for any new or worse symptoms.  Patient now feels better.  Final Clinical Impression(s) / ED Diagnoses Final diagnoses:  Palpitations  Precordial pain    Rx / DC Orders ED Discharge Orders    None       Fredia Sorrow,  MD 01/31/20 2125

## 2020-01-31 NOTE — ED Triage Notes (Signed)
Chest pain, sob and left arm pain today. States she has an ICD that has not fired. Hx of CHF.

## 2020-01-31 NOTE — Discharge Instructions (Addendum)
Return for any new or worse symptoms.  Cardiac monitoring here showed no arrhythmias.  Heart rate was predominantly in the 80s to mid 90s.  Troponins x2 - chest x-ray negative for any pulmonary edema and BNP was not elevated.  Keep your appointment with cardiology as scheduled for tomorrow they can review these labs.

## 2020-01-31 NOTE — ED Notes (Signed)
ED Provider at bedside. 

## 2020-01-31 NOTE — ED Notes (Signed)
Pt transported to XR.  Unable to urinate at this time.  Call bell within reach.

## 2020-01-31 NOTE — Telephone Encounter (Signed)
The patient called the answering service for feeling weak and like she is going to pass out.  I called her back and she says that her blood pressure has been running high for several days and then all of a sudden today it went down to 106/66.  I initially advised the patient to drink some water to see if she would feel better with getting her blood pressure up some.  She says that she is feeling weak and like she is going to pass out.  She does admit to having left-sided sharp chest pain but she also has tightness in the left side of her face, left neck and left chest going down her left arm.  She says that she feels like something is just not right and she is going to come into the emergency department.  Although she had a cath in 2017 with normal coronary arteries, her symptoms do seem worrisome and I agree that she should be evaluated in the ED. She has an appointment scheduled with Dr. Haroldine Laws for tomorrow.  I will let Dr. Haroldine Laws know that the patient is coming to the ED.

## 2020-02-01 ENCOUNTER — Ambulatory Visit: Payer: Self-pay

## 2020-02-01 ENCOUNTER — Ambulatory Visit (HOSPITAL_COMMUNITY)
Admission: RE | Admit: 2020-02-01 | Discharge: 2020-02-01 | Disposition: A | Discharge status: Home / Self Care | Payer: Medicare Other | Source: Ambulatory Visit | Attending: Internal Medicine | Admitting: Internal Medicine

## 2020-02-01 ENCOUNTER — Encounter (HOSPITAL_COMMUNITY): Payer: Self-pay | Admitting: *Deleted

## 2020-02-01 DIAGNOSIS — I110 Hypertensive heart disease with heart failure: Secondary | ICD-10-CM

## 2020-02-01 DIAGNOSIS — I10 Essential (primary) hypertension: Secondary | ICD-10-CM

## 2020-02-01 DIAGNOSIS — I5022 Chronic systolic (congestive) heart failure: Secondary | ICD-10-CM | POA: Diagnosis not present

## 2020-02-01 DIAGNOSIS — M329 Systemic lupus erythematosus, unspecified: Secondary | ICD-10-CM

## 2020-02-01 DIAGNOSIS — E118 Type 2 diabetes mellitus with unspecified complications: Secondary | ICD-10-CM

## 2020-02-01 MED ORDER — POTASSIUM CHLORIDE CRYS ER 20 MEQ PO TBCR: 20.0000 meq | EXTENDED_RELEASE_TABLET | Freq: Every day | ORAL | 3 refills | Status: DC

## 2020-02-01 MED ORDER — HYDRALAZINE HCL 100 MG PO TABS: 100.0000 mg | ORAL_TABLET | Freq: Three times a day (TID) | ORAL | 6 refills | Status: DC

## 2020-02-01 NOTE — Patient Instructions (Signed)
Social Worker Visit Information  Goals we discussed today:  Goals Addressed            This Visit's Progress   . Collaborate with RN Case Manager and PharmD to assist with care coordination needs in response to recent ED visit       Current Barriers:  . Care management and care coordination needs related to CHF which resulted in emergency department visit  Social Work Clinical Goal(s):  Marland Kitchen Over the next 30 days, patient will collaborate with care management team to assist with care coordination needs in response to recent emergency department visit  Interventions: . Reviewed discharge paperwork and plan to follow up with cardiology . Confirmed the patient has virtual appointment with Dr. Haroldine Laws scheduled for today, 02/01/20 . Discussed plan to work with care management team over the next month to review patient status and assist with care management needs . Collaboration with CM team regarding recent ED visit o Next PharmD appointment scheduled for 02/07/20 o Next RN Case Manager appointment scheduled for 02/29/20  Patient Self Care Activities:  . Self administers medications as prescribed . Attends all scheduled provider appointments . Calls pharmacy for medication refills . Performs ADL's independently . Calls provider office for new concerns or questions  Initial goal documentation        Follow Up Plan: No Sw follow up planned at this time. The patient will remain active with RN Case Manager and PharmD.   Daneen Schick, BSW, CDP Social Worker, Certified Dementia Practitioner [place] [facility] [phone]

## 2020-02-01 NOTE — Progress Notes (Signed)
Received orders from Dr Haroldine Laws:  Bensimhon, Shaune Pascal, MD  You 43 minutes ago (12:42 PM)   1. Restart Kcl at 20 meq daily. Take 3 tabs today  2. Increase hydralazine to 100 tid  3. Office visit in 4 weeks with echo    Spoke w/pt via phone, she is aware, agreeable and verbalized understanding.  She states she has KCL at home and doesn't need rx at this time, new rx for Hydralazine sent in, echo and f/u appt sch for 3/12, AVS sent via mychart.

## 2020-02-01 NOTE — Addendum Note (Signed)
Encounter addended by: Scarlette Calico, RN on: 02/01/2020 1:35 PM  Actions taken: Visit diagnoses modified, Pharmacy for encounter modified, Order list changed, Diagnosis association updated, Clinical Note Signed

## 2020-02-01 NOTE — Patient Instructions (Signed)
Increase Hydralazine to 100 mg Three times a day   Start Potassium 20 meq daily  Your physician recommends that you schedule a follow-up appointment in: 1 month with echocardiogram:   Friday March 12th 2 pm Echocardiogram     3 pm Dr Haroldine Laws  Parking Code: [phone]  If you have any questions or concerns before your next appointment please send [place] a message through [place] or call our office at [phone].  At the [facility], you and your health needs are our priority. As part of our continuing mission to provide you with exceptional heart care, we have created designated Provider Care Teams. These Care Teams include your primary Cardiologist (physician) and Advanced Practice Providers (APPs- Physician Assistants and Nurse Practitioners) who all work together to provide you with the care you need, when you need it.   You may see any of the following providers on your designated Care Team at your next follow up: Marland Kitchen Dr Glori Bickers . Dr Loralie Champagne . Darrick Grinder, NP . Lyda Jester, PA . Audry Riles, PharmD   Please be sure to bring in all your medications bottles to every appointment.

## 2020-02-01 NOTE — Chronic Care Management (AMB) (Signed)
Care Management    Consult Note  02/01/2020 Name: Guiliana Shor MRN: [id number] DOB: 1971-04-24  Care management team received notification of patient's recent emergency department visit related to CHF .Based on review of health record, Jacqlyn Marolf is currently active in the embedded care coordination program.. Outbound call placed to the patient to assist with care coordination needs.   Review of patient status, including review of consultants reports, relevant laboratory and other test results, and collaboration with appropriate care team members and the patient's provider was performed as part of comprehensive patient evaluation and provision of chronic care management services.    SW placed a successful outbound call to the patient to assist with care coordination needs.  Medications: Outpatient Encounter Medications as of 02/01/2020  Medication Sig  . Abatacept (ORENCIA) 125 MG/ML SOSY Inject into the skin once a week.  Marland Kitchen ACCU-CHEK FASTCLIX LANCETS MISC USE TO PRICK FINGER TO TEST BLOOD SUGAR BEFORE BREAKFAST AND DINNER  . albuterol (ACCUNEB) 1.25 MG/3ML nebulizer solution Take 3 mLs (1.25 mg total) by nebulization every 6 (six) hours as needed for wheezing.  Marland Kitchen aspirin 81 MG EC tablet Take by mouth daily. Taking 2 tablets  . atorvastatin (LIPITOR) 10 MG tablet TAKE 1 TABLET(10 MG) BY MOUTH DAILY  . Azelastine HCl 137 MCG/SPRAY SOLN Place 2 sprays into the nose daily.  . Bacillus Coagulans-Inulin (PROBIOTIC) 1-250 BILLION-MG CAPS Take 1 capsule by mouth daily.  . blood glucose meter kit and supplies KIT Dispense based on patient and insurance preference. Use up to four times daily as directed. (FOR ICD-9 250.00, 250.01). FILL BASED ON INSURANCE PREFERENCE.  Marland Kitchen Blood Glucose Monitoring Suppl (ACCU-CHEK AVIVA PLUS) w/Device KIT DX: E11.9. Use as directed to test blood sugar  . carvedilol (COREG) 25 MG tablet TAKE 1 TABLET(25 MG) BY MOUTH TWICE DAILY WITH A MEAL  . Continuous Blood  Gluc Receiver (DEXCOM G6 RECEIVER) DEVI 1 Device by Does not apply route 3 (three) times daily before meals.  . Continuous Blood Gluc Sensor (DEXCOM G6 SENSOR) MISC Inject 1 each into the skin 3 (three) times daily.  . Continuous Blood Gluc Transmit (DEXCOM G6 TRANSMITTER) MISC 1 Device by Does not apply route 3 (three) times daily before meals.  . dapagliflozin propanediol (FARXIGA) 10 MG TABS tablet Take 10 mg by mouth daily before breakfast.  . diclofenac sodium (VOLTAREN) 1 % GEL Apply 1 g topically as needed.  . dicyclomine (BENTYL) 20 MG tablet Take 1 tablet (20 mg total) by mouth 3 (three) times daily as needed for spasms.  . fluconazole (DIFLUCAN) 200 MG tablet Take 1 tablet daily for three days.  . folic acid (FOLVITE) 1 MG tablet TAKE 1 TABLET BY MOUTH EVERY MORNING (Patient taking differently: Take 1 mg by mouth daily. )  . glucose blood (ACCU-CHEK GUIDE) test strip DX: E11.9 Use as instructed to test blood sugars 3-4 times daily.  . hydrALAZINE (APRESOLINE) 50 MG tablet Take 1.5 tablets (75 mg total) by mouth 3 (three) times daily.  Marland Kitchen HYDROcodone-acetaminophen (NORCO) 5-325 MG tablet Take 1 tablet by mouth every 4 (four) hours as needed (for pain).  . hydroxychloroquine (PLAQUENIL) 200 MG tablet Take 1 tablet (200 mg total) by mouth 2 (two) times daily.  Marland Kitchen ibuprofen (ADVIL) 600 MG tablet Take 1 tablet (600 mg total) by mouth every 6 (six) hours as needed.  Marland Kitchen ipratropium (ATROVENT) 0.03 % nasal spray USE 2 SPRAYS IN EACH NOSTRIL THREE TIMES DAILY AS NEEDED FOR RHINITIS (Patient not taking: Reported on  11/24/2019)  . Magnesium 200 MG TABS Take 1 tablet by mouth daily.  . meloxicam (MOBIC) 15 MG tablet Take 15 mg by mouth daily.   . metFORMIN (GLUCOPHAGE) 500 MG tablet TAKE 1 TABLET BY MOUTH EVERY DAY WITH THE MORNING AND EVENING MEAL  . methotrexate (RHEUMATREX) 2.5 MG tablet Take 20 mg by mouth every Friday.   . metolazone (ZAROXOLYN) 2.5 MG tablet TAKE 1 TABLET BY MOUTH AS NEEDED FOR  WEIGHT GAIN OF 5 POUNDS WITHIN 3 DAYS AS DIRECTED  . metroNIDAZOLE (METROGEL) 0.75 % vaginal gel Use vaginally every night for 5 nights.  . mometasone (NASONEX) 50 MCG/ACT nasal spray Place 2 sprays into the nose daily.  . montelukast (SINGULAIR) 10 MG tablet TAKE 1 TABLET BY MOUTH DAILY  . Multiple Vitamin (MULTIVITAMIN WITH MINERALS) TABS Take 1 tablet by mouth every morning.   Marland Kitchen NOVOLOG FLEXPEN 100 UNIT/ML FlexPen INJECT UP TO 17 UNITS THREE TIMES DAILY BY SLIDING SCALE AS DIRECTED  . ondansetron (ZOFRAN) 4 MG tablet Take 1 tablet (4 mg total) by mouth every 8 (eight) hours as needed for nausea or vomiting.  . predniSONE (DELTASONE) 20 MG tablet Take 2 tablets (40 mg total) by mouth daily.  Marland Kitchen PROAIR HFA 108 (90 Base) MCG/ACT inhaler INHALE 2 PUFFS BY MOUTH EVERY DAY AS NEEDED FOR SHORTNESS OF BREATH  . sacubitril-valsartan (ENTRESTO) 97-103 MG Take 1 tablet by mouth 2 (two) times daily.  Marland Kitchen spironolactone (ALDACTONE) 25 MG tablet TAKE 1 TABLET(25 MG) BY MOUTH TWICE DAILY  . torsemide (DEMADEX) 20 MG tablet Take 1 tablet Monday, Wednesday , and Friday  . traMADol (ULTRAM) 50 MG tablet Take 1 tablet (50 mg total) by mouth every 6 (six) hours as needed.  . traZODone (DESYREL) 50 MG tablet Take 50 mg by mouth at bedtime.  . TRESIBA FLEXTOUCH 100 UNIT/ML SOPN FlexTouch Pen INJECT 12 UNIT INTO THE SKIN EVERY NIGHT AT BEDTIME PER INSULIN PROTOCOL (Patient taking differently: Inject 12 Units into the skin. )  . Vitamin D, Ergocalciferol, (DRISDOL) 1.25 MG (50000 UT) CAPS capsule TAKE 1 CAPSULE BY MOUTH 2 TIMES EVERY WEEK   No facility-administered encounter medications on file as of 02/01/2020.      Goals Addressed            This Visit's Progress   . Collaborate with RN Case Manager and PharmD to assist with care coordination needs in response to recent ED visit       Current Barriers:  . Care management and care coordination needs related to CHF which resulted in emergency department visit   Social Work Clinical Goal(s):  Marland Kitchen Over the next 30 days, patient will collaborate with care management team to assist with care coordination needs in response to recent emergency department visit  Interventions: . Reviewed discharge paperwork and plan to follow up with cardiology . Confirmed the patient has virtual appointment with Dr. Haroldine Laws scheduled for today, 02/01/20 . Discussed plan to work with care management team over the next month to review patient status and assist with care management needs . Collaboration with CM team regarding recent ED visit o Next PharmD appointment scheduled for 02/07/20 o Next RN Case Manager appointment scheduled for 02/29/20  Patient Self Care Activities:  . Self administers medications as prescribed . Attends all scheduled provider appointments . Calls pharmacy for medication refills . Performs ADL's independently . Calls provider office for new concerns or questions  Initial goal documentation       Plan: Next care  management appointment with PharmD scheduled for 02/07/20.  Daneen Schick, BSW, CDP Social Worker, Certified Dementia Practitioner [place] [facility] [place] [facility] [phone]  Total time spent performing care coordination and/or care management activities with the patient by phone or face to face = 6 minutes.

## 2020-02-01 NOTE — Progress Notes (Signed)
Heart Failure TeleHealth Note  Due to national recommendations of social distancing due to [place] 19, Audio/video telehealth visit is felt to be most appropriate for this patient at this time.  See MyChart message from today for patient consent regarding telehealth for [facility].  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   Admitted in October 2017 with increased dyspnea and volume overload. Diuresed with IV lasix and transitioned to lasix 40 mg twice a day. LHC/RHC normal filling pressures, EF ~20%, and normal coronaries. Discharge weight 184 pounds.    Cardiac MRI in 02/2015 showed EF 46%. No LGE.  Echo 02/12/19, which showed EF 40-45%, RV normal.   CPX 9/19 with relaltively normal spirometry. Mild HF limitation.   Had R TKA 3/20   Called the on-call pager last night saying BP had been high for several days but yesterday down she was sitting and eating and began to feel bad. BP dropped 106/66 and she felt presyncopal and was having CP radiating to her neck and palpitation Went to ER and w/u normal. ICD no events. Hstrop 2. Rechecked and remained 2. BNP 40. CXR clear. ECG ok.   She presents via Engineer, civil (consulting) for a telehealth visit today. Says feels much better. No further CP or SOB. BP this am 147/93. (BP have been running 150-170/90-110)      Christine Mcdowell denies symptoms worrisome for COVID 19.   Past Medical History:    Diagnosis Date  . AICD (automatic cardioverter/defibrillator) present 01/28/2018  . Anemia   . Anginal pain ([place])   . Asthma   . Cervical cancer ([place])   . CHF (congestive heart failure) ([place])   . Diabetes mellitus without complication ([place])    steroid induced  . Discoid lupus   . Fibromyalgia   . History of blood transfusion "several"   "related to anemia; had some w/hysterectomy also"  . Hx of cardiovascular stress test    ETT-Myoview (9/15):  No ischemia, EF 52%; NORMAL  . Hx of echocardiogram    Echo (9/15):  EF 50-55%, ant HK, Gr 1 DD, mild MR, mild LAE, no effusion  . Hypertension   . Iron deficiency anemia    h/o iron transfusions  . Lupus (systemic lupus erythematosus) ([place])   . Migraine    "a few/year" (07/03/2016)  . Pneumonia 12/2015  . RA (rheumatoid arthritis) ([place])    "all over" (07/03/2016)  . Sickle cell trait ([place])   . Stroke ([place]) 2014 X 1; 2015 X 2; 2016 X 1;    "right side of face more relaxed than the other; rare speech hesitation" (07/03/2016)  . Vaginal Pap smear, abnormal    ASCUS; HPV   Past Surgical History:  Procedure Laterality Date  . ABDOMINAL HYSTERECTOMY  2009  . ABDOMINAL WOUND DEHISCENCE  2009  . CARDIAC CATHETERIZATION N/A 10/11/2016   Procedure: Right/Left Heart Cath and Coronary Angiography;  Surgeon: Jolaine Artist, MD;  Location: [place] [facility];  Service: Cardiovascular;  Laterality: N/A;  . DILATION  AND CURETTAGE OF UTERUS  1991  . HEMATOMA EVACUATION  2009   abdomen  . ICD IMPLANT  01/28/2018  . ICD IMPLANT N/A 01/28/2018   Procedure: ICD IMPLANT;  Surgeon: Evans Lance, MD;  Location: [facility];  Service: Cardiovascular;  Laterality: N/A;  . INCISE AND DRAIN ABCESS  2009 X 2   "abdomen after hysterectomy"  . KNEE ARTHROSCOPY Right 1997  . KNEE SURGERY Right   . TUBAL LIGATION  1996     Current Outpatient Medications  Medication Sig Dispense Refill  . Abatacept (ORENCIA) 125 MG/ML SOSY Inject into the skin  once a week.    Marland Kitchen ACCU-CHEK FASTCLIX LANCETS MISC USE TO PRICK FINGER TO TEST BLOOD SUGAR BEFORE BREAKFAST AND DINNER  10  . albuterol (ACCUNEB) 1.25 MG/3ML nebulizer solution Take 3 mLs (1.25 mg total) by nebulization every 6 (six) hours as needed for wheezing. 75 mL 12  . aspirin 81 MG EC tablet Take by mouth daily. Taking 2 tablets    . atorvastatin (LIPITOR) 10 MG tablet TAKE 1 TABLET(10 MG) BY MOUTH DAILY 90 tablet 0  . Azelastine HCl 137 MCG/SPRAY SOLN Place 2 sprays into the nose daily. 30 mL 3  . Bacillus Coagulans-Inulin (PROBIOTIC) 1-250 BILLION-MG CAPS Take 1 capsule by mouth daily. 30 capsule 2  . blood glucose meter kit and supplies KIT Dispense based on patient and insurance preference. Use up to four times daily as directed. (FOR ICD-9 250.00, 250.01). FILL BASED ON INSURANCE PREFERENCE. 1 each 3  . Blood Glucose Monitoring Suppl (ACCU-CHEK AVIVA PLUS) w/Device KIT DX: E11.9. Use as directed to test blood sugar 1 kit 1  . carvedilol (COREG) 25 MG tablet TAKE 1 TABLET(25 MG) BY MOUTH TWICE DAILY WITH A MEAL 180 tablet 0  . Continuous Blood Gluc Receiver (DEXCOM G6 RECEIVER) DEVI 1 Device by Does not apply route 3 (three) times daily before meals. 1 each 1  . Continuous Blood Gluc Sensor (DEXCOM G6 SENSOR) MISC Inject 1 each into the skin 3 (three) times daily. 3 each 1  . Continuous Blood Gluc Transmit (DEXCOM G6 TRANSMITTER) MISC 1 Device by Does not apply route 3 (three) times daily before meals. 1 each 1  . dapagliflozin propanediol (FARXIGA) 10 MG TABS tablet Take 10 mg by mouth daily before breakfast. 30 tablet 6  . diclofenac sodium (VOLTAREN) 1 % GEL Apply 1 g topically as needed.    . dicyclomine (BENTYL) 20 MG tablet Take 1 tablet (20 mg total) by mouth 3 (three) times daily as needed for spasms. 30 tablet 0  . fluconazole (DIFLUCAN) 200 MG tablet Take 1 tablet daily for three days. 3 tablet 0  . folic acid (FOLVITE) 1 MG tablet TAKE 1 TABLET BY MOUTH EVERY MORNING (Patient  taking differently: Take 1 mg by mouth daily. ) 30 tablet 0  . glucose blood (ACCU-CHEK GUIDE) test strip DX: E11.9 Use as instructed to test blood sugars 3-4 times daily. 100 each 12  . hydrALAZINE (APRESOLINE) 50 MG tablet Take 1.5 tablets (75 mg total) by mouth 3 (three) times daily. 135 tablet 6  . HYDROcodone-acetaminophen (NORCO) 5-325 MG tablet Take 1 tablet by mouth every 4 (four) hours as needed (for pain). 12 tablet 0  . hydroxychloroquine (PLAQUENIL) 200 MG tablet Take 1 tablet (200 mg total) by mouth 2 (two) times daily. 180 tablet 0  . ibuprofen (ADVIL) 600 MG tablet Take 1 tablet (600 mg total) by mouth every 6 (six) hours as needed.  30 tablet 0  . ipratropium (ATROVENT) 0.03 % nasal spray USE 2 SPRAYS IN EACH NOSTRIL THREE TIMES DAILY AS NEEDED FOR RHINITIS (Patient not taking: Reported on 11/24/2019) 30 mL 2  . Magnesium 200 MG TABS Take 1 tablet by mouth daily.    . meloxicam (MOBIC) 15 MG tablet Take 15 mg by mouth daily.     . metFORMIN (GLUCOPHAGE) 500 MG tablet TAKE 1 TABLET BY MOUTH EVERY DAY WITH THE MORNING AND EVENING MEAL 180 tablet 1  . methotrexate (RHEUMATREX) 2.5 MG tablet Take 20 mg by mouth every Friday.   3  . metolazone (ZAROXOLYN) 2.5 MG tablet TAKE 1 TABLET BY MOUTH AS NEEDED FOR WEIGHT GAIN OF 5 POUNDS WITHIN 3 DAYS AS DIRECTED 5 tablet 3  . metroNIDAZOLE (METROGEL) 0.75 % vaginal gel Use vaginally every night for 5 nights. 70 g 2  . mometasone (NASONEX) 50 MCG/ACT nasal spray Place 2 sprays into the nose daily. 17 g 2  . montelukast (SINGULAIR) 10 MG tablet TAKE 1 TABLET BY MOUTH DAILY 90 tablet 1  . Multiple Vitamin (MULTIVITAMIN WITH MINERALS) TABS Take 1 tablet by mouth every morning.     Marland Kitchen NOVOLOG FLEXPEN 100 UNIT/ML FlexPen INJECT UP TO 17 UNITS THREE TIMES DAILY BY SLIDING SCALE AS DIRECTED 5 pen 2  . ondansetron (ZOFRAN) 4 MG tablet Take 1 tablet (4 mg total) by mouth every 8 (eight) hours as needed for nausea or vomiting. 20 tablet 0  . predniSONE  (DELTASONE) 20 MG tablet Take 2 tablets (40 mg total) by mouth daily. 4 tablet 0  . PROAIR HFA 108 (90 Base) MCG/ACT inhaler INHALE 2 PUFFS BY MOUTH EVERY DAY AS NEEDED FOR SHORTNESS OF BREATH 8.5 g 0  . sacubitril-valsartan (ENTRESTO) 97-103 MG Take 1 tablet by mouth 2 (two) times daily. 60 tablet 6  . spironolactone (ALDACTONE) 25 MG tablet TAKE 1 TABLET(25 MG) BY MOUTH TWICE DAILY 180 tablet 3  . torsemide (DEMADEX) 20 MG tablet Take 1 tablet Monday, Wednesday , and Friday 15 tablet 6  . traMADol (ULTRAM) 50 MG tablet Take 1 tablet (50 mg total) by mouth every 6 (six) hours as needed. 20 tablet 0  . traZODone (DESYREL) 50 MG tablet Take 50 mg by mouth at bedtime.    . TRESIBA FLEXTOUCH 100 UNIT/ML SOPN FlexTouch Pen INJECT 12 UNIT INTO THE SKIN EVERY NIGHT AT BEDTIME PER INSULIN PROTOCOL (Patient taking differently: Inject 12 Units into the skin. ) 15 mL 3  . Vitamin D, Ergocalciferol, (DRISDOL) 1.25 MG (50000 UT) CAPS capsule TAKE 1 CAPSULE BY MOUTH 2 TIMES EVERY WEEK 24 capsule 0   No current facility-administered medications for this encounter.    Allergies:   Hydromorphone, Iodinated diagnostic agents, Other, Erythromycin, Latex, Tape, and Mircette [desogestrel-ethinyl estradiol]   Social History:  The patient  reports that she has never smoked. She has never used smokeless tobacco. She reports that she does not drink alcohol or use drugs.   Family History:  The patient's family history includes Allergies in her father and mother; Arthritis in her mother; Asthma in her maternal grandmother; Cancer in her paternal grandfather; Cushing syndrome in her father; Dementia in her paternal grandmother; Depression in her father; Diabetes in her maternal grandmother; Drug abuse in her mother; Heart attack in her father and maternal grandfather; Heart murmur in her mother; Hypertension in her maternal grandmother.   ROS:  Please see the history of present illness.   All other systems are personally  reviewed  and negative.   Exam:  (Video/Tele Health Call; Exam is subjective and or/visual.) General:  Speaks in full sentences. No resp difficulty. Lungs: Normal respiratory effort with conversation.  Abdomen: Non-distended per patient report Extremities: Pt denies edema. Neuro: Alert & oriented x 3.   Recent Labs: 03/29/2019: TSH 0.750 12/02/2019: ALT 12 01/31/2020: B Natriuretic Peptide 40.3; BUN 13; Creatinine, Ser 0.88; Hemoglobin 12.0; Platelets 277; Potassium 3.4; Sodium 141  Personally reviewed   Wt Readings from Last 3 Encounters:  01/31/20 88.5 kg (195 lb 1.7 oz)  11/24/19 88.5 kg (195 lb)  11/22/19 89.8 kg (198 lb)      ASSESSMENT AND PLAN:  1. Chronic Systolic Heart Failure - NICM Cath 10/11/16 with normal cors. CPX 10/2016: Peak VO2: 18.5 (82% predicted peak VO2), VE/VCO2 slope: 30. ECHO 12/18 EF 25-30%  - S/P [employer] HeartLogic ICD 01/2018.  - Cardiac MRI in 02/2015 showed EF 46%. No LGE. - Repeat CPX 9/19 with mild HR limitation - Echo 02/12/19: EF 40-45% -Overall stable NYHA II - Volume status ok. Continue torsemide 20 MWF. Continue metolazone prn as needed - Continue spiro 25 mg qHS.  - Continue coreg 25 mg BID -ContinueEntresto 97/103 mg BID  - Off Farxiga 10 due to recurrent yeast infections - Increased hydralazine to 100 mg TID. Not on imdur due to headaches.  - She has been consented for cardiomems, but will plan to monitor her with HeartLogic device for now unless she has frequent hospitalizations for CHF - Will see in clinic in 4 weeks with repeat echo  2. Sinus tach vs atrial flutter on ICD - Still with palpitations but no VT on ICD - Zio patch 9/20 Rare PVCs and PAcs 3. HTN -> Hypotension -BP elevated. Increase hydral to 100 tid 4. OSA - Not wearing CPAP. OSA improved with weight loss.  5. DM2  - Off Farxiga 10 due to recurrent yeast infections 6. Hypokalemia - Add back K 20 daily  COVID screen The patient does not have any symptoms  that suggest any further testing/ screening at this time.  Social distancing reinforced today.  Recommended follow-up:  As above  Relevant cardiac medications were reviewed at length with the patient today.   The patient does not have concerns regarding their medications at this time.   The following changes were made today:  As above  Today, I have spent 18 minutes with the patient with telehealth technology discussing the above issues .    Signed, Glori Bickers, MD  02/01/2020 12:29 PM  [facility] [place] [address] [facility] [place] [address] [phone] (office) [phone] (fax)

## 2020-02-07 ENCOUNTER — Ambulatory Visit (INDEPENDENT_AMBULATORY_CARE_PROVIDER_SITE_OTHER): Payer: Medicare Other

## 2020-02-07 ENCOUNTER — Ambulatory Visit: Payer: Self-pay | Admitting: Pharmacist

## 2020-02-07 DIAGNOSIS — Z95810 Presence of automatic (implantable) cardiac defibrillator: Secondary | ICD-10-CM

## 2020-02-07 DIAGNOSIS — Z794 Long term (current) use of insulin: Secondary | ICD-10-CM

## 2020-02-07 DIAGNOSIS — Z7952 Long term (current) use of systemic steroids: Secondary | ICD-10-CM | POA: Diagnosis not present

## 2020-02-07 DIAGNOSIS — E119 Type 2 diabetes mellitus without complications: Secondary | ICD-10-CM

## 2020-02-07 DIAGNOSIS — Z79899 Other long term (current) drug therapy: Secondary | ICD-10-CM | POA: Diagnosis not present

## 2020-02-07 DIAGNOSIS — M329 Systemic lupus erythematosus, unspecified: Secondary | ICD-10-CM | POA: Diagnosis not present

## 2020-02-07 DIAGNOSIS — I5022 Chronic systolic (congestive) heart failure: Secondary | ICD-10-CM | POA: Diagnosis not present

## 2020-02-07 DIAGNOSIS — M351 Other overlap syndromes: Secondary | ICD-10-CM | POA: Diagnosis not present

## 2020-02-07 DIAGNOSIS — M0579 Rheumatoid arthritis with rheumatoid factor of multiple sites without organ or systems involvement: Secondary | ICD-10-CM | POA: Diagnosis not present

## 2020-02-11 ENCOUNTER — Other Ambulatory Visit: Payer: Self-pay | Admitting: Internal Medicine

## 2020-02-11 NOTE — Progress Notes (Signed)
EPIC Encounter for ICM Monitoring  Patient Name: Christine Mcdowell is a 49 y.o. female Date: 02/11/2020 Primary Care Physican: [name], MD Primary Cardiologist:Bensimhon Electrophysiologist:Taylor LastWeight:184lbs  Transmission reviewed.  02/07/2020 HeartLogic Heart Failure Index1which is within normal threshold.   Prescribed: Torsemide20 mg take1tablet (20 mg total)Mon, Wed and Friday. Potassium 20 mEq take 1 tablet daily. Metolazone 2.5 mg takeone tablet as needed for weight gain of 5 lbs within 3 days.   Labs: 12/02/2019 [name], [id number], [id number] 11/22/2019 Creatinine1.09, BUN7, Potassium 3.2, Sodium141, GFR59->60  11/10/2019 Creatinine0.91, BUN12, Potassium3.5, Sodium143, GFR>60 10/15/2019 Creatinine0.82, BUN18, Potassium4.1, Sodium140, GFR>60 08/18/2019 Creatinine1.01, BUN9, Potassium 4.3, Sodium142, GFR>60  08/14/2019 Creatinine1.03, BUN10, Potassium3.6, Sodium138, GFR>60  08/04/2019 Creatinine0.75, BUN16, Potassium3.7, Sodium140, GFR>60 A complete set of results can be found in Results Review.  Recommendations: None  Follow-up plan: ICM clinic phone appointment on 03/13/2020.   91 day device clinic remote transmission 03/01/2020.     Copy of ICM check sent to Dr. Lovena Le.   3 month ICM trend: 02/07/2020      [name], RN 02/11/2020 5:19 PM

## 2020-02-14 ENCOUNTER — Other Ambulatory Visit (HOSPITAL_COMMUNITY): Payer: Self-pay

## 2020-02-14 MED ORDER — CARVEDILOL 25 MG PO TABS: ORAL_TABLET | ORAL | 3 refills | Status: DC

## 2020-02-14 NOTE — Progress Notes (Signed)
  Chronic Care Management   Outreach Note  02/07/2020 Name: Christine Mcdowell MRN: [id number] DOB: August 11, 1971  Referred by: [name], MD Reason for referral : Chronic Care Management   An unsuccessful telephone outreach was attempted today. The patient was referred to the case management team for assistance with care management and care coordination.   Follow Up Plan: A HIPPA compliant phone message was left for the patient providing contact information and requesting a return call.  The care management team will reach out to the patient again over the next 10-14 days.   SIGNATURE Regina Eck, PharmD, BCPS Clinical Pharmacist, [facility] [place]: [phone]

## 2020-02-16 DIAGNOSIS — M0579 Rheumatoid arthritis with rheumatoid factor of multiple sites without organ or systems involvement: Secondary | ICD-10-CM | POA: Diagnosis not present

## 2020-02-18 ENCOUNTER — Telehealth: Payer: Self-pay | Admitting: Internal Medicine

## 2020-02-18 NOTE — Telephone Encounter (Signed)
Received Rx request for Ventolin HFA INH from pharmacy. Patient has not been seen in our office since 2019. Attempted to call patient to schedule appointment but was not able to reach her. Patient needs follow up in office before this refill can be given. Routing to Dr. Gustavus Bryant nurse for follow up.

## 2020-02-22 NOTE — Telephone Encounter (Signed)
LMTCB

## 2020-02-23 DIAGNOSIS — M25461 Effusion, right knee: Secondary | ICD-10-CM | POA: Diagnosis not present

## 2020-02-23 DIAGNOSIS — Z471 Aftercare following joint replacement surgery: Secondary | ICD-10-CM | POA: Diagnosis not present

## 2020-02-23 DIAGNOSIS — Z96651 Presence of right artificial knee joint: Secondary | ICD-10-CM | POA: Diagnosis not present

## 2020-02-29 ENCOUNTER — Telehealth: Payer: Self-pay

## 2020-03-01 ENCOUNTER — Ambulatory Visit (INDEPENDENT_AMBULATORY_CARE_PROVIDER_SITE_OTHER): Payer: Medicare Other | Admitting: *Deleted

## 2020-03-01 DIAGNOSIS — I420 Dilated cardiomyopathy: Secondary | ICD-10-CM | POA: Diagnosis not present

## 2020-03-01 LAB — CUP PACEART REMOTE DEVICE CHECK
Battery Remaining Longevity: 162 mo
Battery Remaining Percentage: 100 %
Brady Statistic RV Percent Paced: 0 %
Date Time Interrogation Session: 20210310045100
HighPow Impedance: 76 Ohm
Implantable Lead Implant Date: 20190206
Implantable Lead Location: 753860
Implantable Lead Model: 292
Implantable Lead Serial Number: 438194
Implantable Pulse Generator Implant Date: 20190206
Lead Channel Impedance Value: 544 Ohm
Lead Channel Setting Pacing Amplitude: 2.5 V
Lead Channel Setting Pacing Pulse Width: 0.4 ms
Lead Channel Setting Sensing Sensitivity: 0.5 mV
Pulse Gen Serial Number: 243572

## 2020-03-02 NOTE — Progress Notes (Signed)
ICD Remote  

## 2020-03-03 ENCOUNTER — Ambulatory Visit (HOSPITAL_COMMUNITY): Payer: Medicare Other

## 2020-03-03 ENCOUNTER — Encounter (HOSPITAL_COMMUNITY): Payer: Medicare Other | Admitting: Internal Medicine

## 2020-03-05 ENCOUNTER — Encounter (HOSPITAL_COMMUNITY): Payer: Self-pay

## 2020-03-05 DIAGNOSIS — U071 COVID-19: Secondary | ICD-10-CM | POA: Diagnosis not present

## 2020-03-06 ENCOUNTER — Encounter: Payer: Self-pay | Admitting: Nurse Practitioner

## 2020-03-07 ENCOUNTER — Telehealth (INDEPENDENT_AMBULATORY_CARE_PROVIDER_SITE_OTHER): Payer: Medicare Other | Admitting: Nurse Practitioner

## 2020-03-07 ENCOUNTER — Other Ambulatory Visit: Payer: Self-pay

## 2020-03-07 ENCOUNTER — Encounter: Payer: Self-pay | Admitting: Nurse Practitioner

## 2020-03-07 VITALS — BP 138/85 | HR 97 | Ht 66.0 in | Wt 190.0 lb

## 2020-03-07 DIAGNOSIS — U071 COVID-19: Secondary | ICD-10-CM

## 2020-03-07 DIAGNOSIS — R059 Cough, unspecified: Secondary | ICD-10-CM

## 2020-03-07 DIAGNOSIS — R05 Cough: Secondary | ICD-10-CM | POA: Diagnosis not present

## 2020-03-07 MED ORDER — ALBUTEROL SULFATE 1.25 MG/3ML IN NEBU: 1.0000 | INHALATION_SOLUTION | Freq: Four times a day (QID) | RESPIRATORY_TRACT | 12 refills | Status: DC | PRN

## 2020-03-07 MED ORDER — AMOXICILLIN 875 MG PO TABS: 875.0000 mg | ORAL_TABLET | Freq: Two times a day (BID) | ORAL | 0 refills | Status: DC

## 2020-03-07 MED ORDER — PROAIR HFA 108 (90 BASE) MCG/ACT IN AERS: INHALATION_SPRAY | RESPIRATORY_TRACT | 2 refills | Status: DC

## 2020-03-07 MED ORDER — HYDROCODONE-HOMATROPINE 5-1.5 MG/5ML PO SYRP: 5.0000 mL | ORAL_SOLUTION | Freq: Four times a day (QID) | ORAL | 0 refills | Status: DC | PRN

## 2020-03-07 NOTE — Progress Notes (Signed)
Virtual Visit via Video   This visit type was conducted due to national recommendations for restrictions regarding the COVID-19 Pandemic (e.g. social distancing) in an effort to limit this patient's exposure and mitigate transmission in our community.  Due to her co-morbid illnesses, this patient is at least at moderate risk for complications without adequate follow up.  This format is felt to be most appropriate for this patient at this time.  All issues noted in this document were discussed and addressed.  A limited physical exam was performed with this format.    This visit type was conducted due to national recommendations for restrictions regarding the COVID-19 Pandemic (e.g. social distancing) in an effort to limit this patient's exposure and mitigate transmission in our community.  Patients identity confirmed using two different identifiers.  This format is felt to be most appropriate for this patient at this time.  All issues noted in this document were discussed and addressed.  No physical exam was performed (except for noted visual exam findings with Video Visits).    Date:  03/12/2020   ID:  Christine Mcdowell, DOB 1971/05/08, MRN [id number]  Patient Location:  Home - spoke with Christine Mcdowell  Provider location:   Office    Chief Complaint:  Positive for covid.    History of Present Illness:    Christine Mcdowell is a 49 y.o. female who presents via video conferencing for a telehealth visit today.    The patient does have symptoms concerning for COVID-19 infection (fever, chills, cough, or new shortness of breath).   HPI  She is positive for covid as of Sunday exposed on Wednesday, the upset stomach was a new symptom. She had been having her typically allergy symptoms.  She is also aching but does not feel abnormal for her, except for on Sunday she had back pain. She took her daughter to the ER who had covid 6 weeks ago. She is reporting taking breathing treatments  Every  3-4 hours and she is out of her inhaler as well.  Past Medical History:  Diagnosis Date  . AICD (automatic cardioverter/defibrillator) present 01/28/2018  . Anemia   . Anginal pain ([place])   . Asthma   . Cervical cancer ([place])   . CHF (congestive heart failure) ([place])   . 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 Discoid lupus   . Fibromyalgia   . History of blood transfusion "several"   "related to anemia; had some w/hysterectomy also"  . Hx of cardiovascular stress test    ETT-Myoview (9/15):  No ischemia, EF 52%; NORMAL  . Hx of echocardiogram    Echo (9/15):  EF 50-55%, ant HK, Gr 1 DD, mild MR, mild LAE, no effusion  . Hypertension   . Iron deficiency anemia    h/o iron transfusions  . Lupus (systemic lupus erythematosus) ([place])   . Migraine    "a few/year" (07/03/2016)  . Pneumonia 12/2015  . RA (rheumatoid arthritis) ([place])    "all over" (07/03/2016)  . Sickle cell trait ([place])   . Stroke ([place]) 2014 X 1; 2015 X 2; 2016 X 1;    "right side of face more relaxed than the other; rare speech hesitation" (07/03/2016)  . Vaginal Pap smear, abnormal    ASCUS; HPV   Past Surgical History:  Procedure Laterality Date  . ABDOMINAL HYSTERECTOMY  2009  . ABDOMINAL WOUND DEHISCENCE  2009  . CARDIAC CATHETERIZATION N/A 10/11/2016   Procedure: Right/Left Heart Cath  and Coronary Angiography;  Surgeon: Jolaine Artist, MD;  Location: [facility];  Service: Cardiovascular;  Laterality: N/A;  . DILATION AND CURETTAGE OF UTERUS  1991  . HEMATOMA EVACUATION  2009   abdomen  . ICD IMPLANT  01/28/2018  . ICD IMPLANT N/A 01/28/2018   Procedure: ICD IMPLANT;  Surgeon: Evans Lance, MD;  Location: [facility];  Service: Cardiovascular;  Laterality: N/A;  . INCISE AND DRAIN ABCESS  2009 X 2   "abdomen after hysterectomy"  . KNEE ARTHROSCOPY Right 1997  . KNEE SURGERY Right   . TUBAL LIGATION  1996     Current Meds  Medication Sig  . ACCU-CHEK FASTCLIX  LANCETS MISC USE TO PRICK FINGER TO TEST BLOOD SUGAR BEFORE BREAKFAST AND DINNER  . albuterol (ACCUNEB) 1.25 MG/3ML nebulizer solution Take 3 mLs (1.25 mg total) by nebulization every 6 (six) hours as needed for wheezing.  Marland Kitchen aspirin 81 MG EC tablet Take by mouth daily. Taking 2 tablets  . atorvastatin (LIPITOR) 10 MG tablet TAKE 1 TABLET(10 MG) BY MOUTH DAILY  . Azelastine HCl 137 MCG/SPRAY SOLN Place 2 sprays into the nose daily.  . Bacillus Coagulans-Inulin (PROBIOTIC) 1-250 BILLION-MG CAPS Take 1 capsule by mouth daily.  . blood glucose meter kit and supplies KIT Dispense based on patient and insurance preference. Use up to four times daily as directed. (FOR ICD-9 250.00, 250.01). FILL BASED ON INSURANCE PREFERENCE.  Marland Kitchen Blood Glucose Monitoring Suppl (ACCU-CHEK AVIVA PLUS) w/Device KIT DX: E11.9. Use as directed to test blood sugar  . carvedilol (COREG) 25 MG tablet TAKE 1 TABLET(25 MG) BY MOUTH TWICE DAILY WITH A MEAL  . Continuous Blood Gluc Receiver (DEXCOM G6 RECEIVER) DEVI 1 Device by Does not apply route 3 (three) times daily before meals.  . Continuous Blood Gluc Sensor (DEXCOM G6 SENSOR) MISC Inject 1 each into the skin 3 (three) times daily.  . Continuous Blood Gluc Transmit (DEXCOM G6 TRANSMITTER) MISC 1 Device by Does not apply route 3 (three) times daily before meals.  . diclofenac sodium (VOLTAREN) 1 % GEL Apply 1 g topically as needed.  . folic acid (FOLVITE) 1 MG tablet TAKE 1 TABLET BY MOUTH EVERY MORNING (Patient taking differently: Take 1 mg by mouth daily. )  . hydrALAZINE (APRESOLINE) 100 MG tablet Take 1 tablet (100 mg total) by mouth 3 (three) times daily.  . hydroxychloroquine (PLAQUENIL) 200 MG tablet Take 1 tablet (200 mg total) by mouth 2 (two) times daily.  Marland Kitchen ibuprofen (ADVIL) 600 MG tablet Take 1 tablet (600 mg total) by mouth every 6 (six) hours as needed.  Marland Kitchen ipratropium (ATROVENT) 0.03 % nasal spray USE 2 SPRAYS IN EACH NOSTRIL THREE TIMES DAILY AS NEEDED FOR RHINITIS   . Magnesium 200 MG TABS Take 1 tablet by mouth daily.  . meloxicam (MOBIC) 15 MG tablet Take 15 mg by mouth daily.   . metFORMIN (GLUCOPHAGE) 500 MG tablet TAKE 1 TABLET BY MOUTH EVERY DAY WITH THE MORNING AND EVENING MEAL  . metolazone (ZAROXOLYN) 2.5 MG tablet TAKE 1 TABLET BY MOUTH AS NEEDED FOR WEIGHT GAIN OF 5 POUNDS WITHIN 3 DAYS AS DIRECTED  . metroNIDAZOLE (METROGEL) 0.75 % vaginal gel Use vaginally every night for 5 nights.  . montelukast (SINGULAIR) 10 MG tablet TAKE 1 TABLET BY MOUTH DAILY  . Multiple Vitamin (MULTIVITAMIN WITH MINERALS) TABS Take 1 tablet by mouth every morning.   Marland Kitchen NOVOLOG FLEXPEN 100 UNIT/ML FlexPen INJECT UP TO 17 UNITS THREE  TIMES DAILY BY SLIDING SCALE AS DIRECTED  . ondansetron (ZOFRAN) 4 MG tablet Take 1 tablet (4 mg total) by mouth every 8 (eight) hours as needed for nausea or vomiting.  . potassium chloride SA (KLOR-CON) 20 MEQ tablet Take 1 tablet (20 mEq total) by mouth daily.  . predniSONE (DELTASONE) 20 MG tablet Take 2 tablets (40 mg total) by mouth daily.  Marland Kitchen PROAIR HFA 108 (90 Base) MCG/ACT inhaler INHALE 2 PUFFS BY MOUTH EVERY DAY AS NEEDED FOR SHORTNESS OF BREATH  . sacubitril-valsartan (ENTRESTO) 97-103 MG Take 1 tablet by mouth 2 (two) times daily.  Marland Kitchen spironolactone (ALDACTONE) 25 MG tablet TAKE 1 TABLET(25 MG) BY MOUTH TWICE DAILY  . torsemide (DEMADEX) 20 MG tablet Take 1 tablet Monday, Wednesday , and Friday  . traMADol (ULTRAM) 50 MG tablet Take 1 tablet (50 mg total) by mouth every 6 (six) hours as needed.  . traZODone (DESYREL) 50 MG tablet Take 50 mg by mouth at bedtime.  . TRESIBA FLEXTOUCH 100 UNIT/ML SOPN FlexTouch Pen INJECT 12 UNIT INTO THE SKIN EVERY NIGHT AT BEDTIME PER INSULIN PROTOCOL  . Vitamin D, Ergocalciferol, (DRISDOL) 1.25 MG (50000 UT) CAPS capsule TAKE 1 CAPSULE BY MOUTH 2 TIMES EVERY WEEK  . [DISCONTINUED] albuterol (ACCUNEB) 1.25 MG/3ML nebulizer solution Take 3 mLs (1.25 mg total) by nebulization every 6 (six) hours as  needed for wheezing.  . [DISCONTINUED] PROAIR HFA 108 (90 Base) MCG/ACT inhaler INHALE 2 PUFFS BY MOUTH EVERY DAY AS NEEDED FOR SHORTNESS OF BREATH     Allergies:   Hydromorphone, Iodinated diagnostic agents, Other, Erythromycin, Latex, Tape, and Mircette [desogestrel-ethinyl estradiol]   Social History   Tobacco Use  . Smoking status: Never Smoker  . Smokeless tobacco: Never Used  Substance Use Topics  . Alcohol use: No  . Drug use: No     Family Hx: The patient's family history includes Allergies in her father and mother; Arthritis in her mother; Asthma in her maternal grandmother; Cancer in her paternal grandfather; Cushing syndrome in her father; Dementia in her paternal grandmother; Depression in her father; Diabetes in her maternal grandmother; Drug abuse in her mother; Heart attack in her father and maternal grandfather; Heart murmur in her mother; Hypertension in her maternal grandmother.  ROS:   Please see the history of present illness.    ROS  All other systems reviewed and are negative.   Labs/Other Tests and Data Reviewed:    Recent Labs: 03/29/2019: TSH 0.750 12/02/2019: ALT 12 01/31/2020: B Natriuretic Peptide 40.3; BUN 13; Creatinine, Ser 0.88; Hemoglobin 12.0; Platelets 277; Potassium 3.4; Sodium 141   Recent Lipid Panel Lab Results  Component Value Date/Time   CHOL 162 12/02/2019 06:08 PM   TRIG 122 12/02/2019 06:08 PM   HDL 52 12/02/2019 06:08 PM   CHOLHDL 3.1 12/02/2019 06:08 PM   LDLCALC 88 12/02/2019 06:08 PM    Wt Readings from Last 3 Encounters:  03/07/20 190 lb (86.2 kg)  01/31/20 195 lb 1.7 oz (88.5 kg)  11/24/19 195 lb (88.5 kg)     Exam:    Vital Signs:  BP 138/85 (BP Location: Left Arm, Patient Position: Sitting, Cuff Size: Large)   Pulse 97   Ht _0  (1.676 m)   Wt 190 lb (86.2 kg)   BMI 30.67 kg/m     Physical Exam  Constitutional: She is oriented to person, place, and time and well-developed, well-nourished, and in no distress.  No distress.  Pulmonary/Chest: Effort normal. No respiratory distress.  Neurological: She is alert and oriented to person, place, and time.  Psychiatric: Mood, memory, affect and judgment normal.    ASSESSMENT & PLAN:    1. COVID-19 virus infection  [facility]  2. Cough  Cough is most problematic causing her difficult with sleeping at times  Will treat with antibiotic due to her history of frequent episodes of pneumonia  If she has worsening shortness of breath she is to go to ER for further evaluation - HYDROcodone-homatropine (HYDROMET) 5-1.5 MG/5ML syrup; Take 5 mLs by mouth every 6 (six) hours as needed.  Dispense: 120 mL; Refill: 0 - amoxicillin (AMOXIL) 875 MG tablet; Take 1 tablet (875 mg total) by mouth 2 (two) times daily.  Dispense: 14 tablet; Refill: 0 - albuterol (ACCUNEB) 1.25 MG/3ML nebulizer solution; Take 3 mLs (1.25 mg total) by nebulization every 6 (six) hours as needed for wheezing.  Dispense: 75 mL; Refill: 12 - PROAIR HFA 108 (90 Base) MCG/ACT inhaler; INHALE 2 PUFFS BY MOUTH EVERY DAY AS NEEDED FOR SHORTNESS OF BREATH  Dispense: 18 g; Refill: 2   COVID-19 Education: The signs and symptoms of COVID-19 were discussed with the patient and how to seek care for testing (follow up with PCP or arrange E-visit).  The importance of social distancing was discussed today.  Patient Risk:   After full review of this patients clinical status, I feel that they are at least moderate risk at this time.  Time:   Today, I have spent 12 minutes/ seconds with the patient with telehealth technology discussing above diagnoses.     Medication Adjustments/Labs and Tests Ordered: Current medicines are reviewed at length with the patient today.  Concerns regarding medicines are outlined above.   Tests Ordered: No orders of the defined types were placed in this encounter.   Medication Changes: Meds ordered this encounter  Medications  . DISCONTD:  amoxicillin (AMOXIL) 875 MG tablet    Sig: Take 1 tablet (875 mg total) by mouth 2 (two) times daily.    Dispense:  14 tablet    Refill:  0  . HYDROcodone-homatropine (HYDROMET) 5-1.5 MG/5ML syrup    Sig: Take 5 mLs by mouth every 6 (six) hours as needed.    Dispense:  120 mL    Refill:  0  . amoxicillin (AMOXIL) 875 MG tablet    Sig: Take 1 tablet (875 mg total) by mouth 2 (two) times daily.    Dispense:  14 tablet    Refill:  0  . albuterol (ACCUNEB) 1.25 MG/3ML nebulizer solution    Sig: Take 3 mLs (1.25 mg total) by nebulization every 6 (six) hours as needed for wheezing.    Dispense:  75 mL    Refill:  12  . PROAIR HFA 108 (90 Base) MCG/ACT inhaler    Sig: INHALE 2 PUFFS BY MOUTH EVERY DAY AS NEEDED FOR SHORTNESS OF BREATH    Dispense:  18 g    Refill:  2    Needs appt for any additional refills thanks    Disposition:  Follow up prn  Signed, [name], FNP

## 2020-03-08 ENCOUNTER — Other Ambulatory Visit: Payer: Self-pay | Admitting: Unknown Physician Specialty

## 2020-03-08 ENCOUNTER — Telehealth: Payer: Self-pay | Admitting: Unknown Physician Specialty

## 2020-03-08 ENCOUNTER — Other Ambulatory Visit (HOSPITAL_COMMUNITY): Payer: Medicare Other

## 2020-03-08 ENCOUNTER — Ambulatory Visit (HOSPITAL_COMMUNITY)
Admission: RE | Admit: 2020-03-08 | Discharge: 2020-03-08 | Disposition: A | Discharge status: Home / Self Care | Payer: Medicare Other | Source: Ambulatory Visit | Attending: Pulmonary Disease | Admitting: Pulmonary Disease

## 2020-03-08 ENCOUNTER — Encounter (HOSPITAL_COMMUNITY): Payer: Self-pay | Admitting: Internal Medicine

## 2020-03-08 ENCOUNTER — Encounter (HOSPITAL_COMMUNITY): Payer: Medicare Other | Admitting: Internal Medicine

## 2020-03-08 DIAGNOSIS — I420 Dilated cardiomyopathy: Secondary | ICD-10-CM

## 2020-03-08 DIAGNOSIS — U071 COVID-19: Secondary | ICD-10-CM | POA: Insufficient documentation

## 2020-03-08 DIAGNOSIS — M329 Systemic lupus erythematosus, unspecified: Secondary | ICD-10-CM | POA: Diagnosis present

## 2020-03-08 DIAGNOSIS — Z23 Encounter for immunization: Secondary | ICD-10-CM | POA: Diagnosis not present

## 2020-03-08 DIAGNOSIS — E118 Type 2 diabetes mellitus with unspecified complications: Secondary | ICD-10-CM

## 2020-03-08 MED ORDER — SODIUM CHLORIDE 0.9 % IV SOLN
700.0000 mg | Freq: Once | INTRAVENOUS | Status: AC
  Administered 2020-03-08: 700 mg via INTRAVENOUS
  Filled 2020-03-08: qty 700

## 2020-03-08 MED ORDER — SODIUM CHLORIDE 0.9 % IV SOLN: INTRAVENOUS | Status: DC | PRN

## 2020-03-08 MED ORDER — EPINEPHRINE 0.3 MG/0.3ML IJ SOAJ: 0.3000 mg | Freq: Once | INTRAMUSCULAR | Status: DC | PRN

## 2020-03-08 MED ORDER — METHYLPREDNISOLONE SODIUM SUCC 125 MG IJ SOLR: 125.0000 mg | Freq: Once | INTRAMUSCULAR | Status: DC | PRN

## 2020-03-08 MED ORDER — FAMOTIDINE IN NACL 20-0.9 MG/50ML-% IV SOLN: 20.0000 mg | Freq: Once | INTRAVENOUS | Status: DC | PRN

## 2020-03-08 MED ORDER — DIPHENHYDRAMINE HCL 50 MG/ML IJ SOLN: 50.0000 mg | Freq: Once | INTRAMUSCULAR | Status: DC | PRN

## 2020-03-08 MED ORDER — ALBUTEROL SULFATE HFA 108 (90 BASE) MCG/ACT IN AERS: 2.0000 | INHALATION_SPRAY | Freq: Once | RESPIRATORY_TRACT | Status: DC | PRN

## 2020-03-08 NOTE — Telephone Encounter (Signed)
  I connected by phone with Christine Mcdowell on 03/08/2020 at 8:11 AM to discuss the potential use of an new treatment for mild to moderate COVID-19 viral infection in non-hospitalized patients.  This patient is a 49 y.o. female that meets the FDA criteria for Emergency Use Authorization of bamlanivimab or casirivimab\imdevimab.  Has a (+) direct SARS-CoV-2 viral test result  Has mild or moderate COVID-19   Is ? 49 years of age and weighs ? 40 kg  Is NOT hospitalized due to COVID-19  Is NOT requiring oxygen therapy or requiring an increase in baseline oxygen flow rate due to COVID-19  Is within 10 days of symptom onset  Has at least one of the high risk factor(s) for progression to severe COVID-19 and/or hospitalization as defined in EUA.  Specific high risk criteria : Diabetes and other co-morbid conditions including Lupus   I have spoken and communicated the following to the patient or parent/caregiver:  1. FDA has authorized the emergency use of bamlanivimab and casirivimab\imdevimab for the treatment of mild to moderate COVID-19 in adults and pediatric patients with positive results of direct SARS-CoV-2 viral testing who are 8 years of age and older weighing at least 40 kg, and who are at high risk for progressing to severe COVID-19 and/or hospitalization.  2. The significant known and potential risks and benefits of bamlanivimab and casirivimab\imdevimab, and the extent to which such potential risks and benefits are unknown.  3. Information on available alternative treatments and the risks and benefits of those alternatives, including clinical trials.  4. Patients treated with bamlanivimab and casirivimab\imdevimab should continue to self-isolate and use infection control measures (e.g., wear mask, isolate, social distance, avoid sharing personal items, clean and disinfect "high touch" surfaces, and frequent handwashing) according to CDC guidelines.   5. The patient or  parent/caregiver has the option to accept or refuse bamlanivimab or casirivimab\imdevimab .  After reviewing this information with the patient, The patient agreed to proceed with receiving the bamlanimivab infusion and will be provided a copy of the Fact sheet prior to receiving the infusion.Christine Mcdowell 03/08/2020 8:11 AM

## 2020-03-08 NOTE — Discharge Instructions (Signed)

## 2020-03-08 NOTE — Progress Notes (Signed)
  Diagnosis: COVID-19  Physician:  Procedure: [facility] Med: bamlanivimab infusion - Provided patient with bamlanimivab fact sheet for patients, parents and caregivers prior to infusion.   Complications: No immediate complications noted.  Discharge: Discharged home   [place], [place] 03/08/2020

## 2020-03-09 ENCOUNTER — Encounter (HOSPITAL_COMMUNITY): Payer: Self-pay

## 2020-03-13 ENCOUNTER — Ambulatory Visit (INDEPENDENT_AMBULATORY_CARE_PROVIDER_SITE_OTHER): Payer: Medicare Other | Admitting: Pharmacist

## 2020-03-13 ENCOUNTER — Encounter (HOSPITAL_COMMUNITY): Payer: Self-pay

## 2020-03-13 ENCOUNTER — Ambulatory Visit (INDEPENDENT_AMBULATORY_CARE_PROVIDER_SITE_OTHER): Payer: Medicare Other

## 2020-03-13 DIAGNOSIS — I5022 Chronic systolic (congestive) heart failure: Secondary | ICD-10-CM

## 2020-03-13 DIAGNOSIS — E1169 Type 2 diabetes mellitus with other specified complication: Secondary | ICD-10-CM | POA: Diagnosis not present

## 2020-03-13 DIAGNOSIS — Z95810 Presence of automatic (implantable) cardiac defibrillator: Secondary | ICD-10-CM | POA: Diagnosis not present

## 2020-03-13 DIAGNOSIS — J984 Other disorders of lung: Secondary | ICD-10-CM | POA: Diagnosis not present

## 2020-03-14 DIAGNOSIS — U071 COVID-19: Secondary | ICD-10-CM | POA: Diagnosis not present

## 2020-03-15 DIAGNOSIS — U071 COVID-19: Secondary | ICD-10-CM | POA: Diagnosis not present

## 2020-03-15 NOTE — Progress Notes (Signed)
EPIC Encounter for ICM Monitoring  Patient Name: Christine Mcdowell is a 49 y.o. female Date: 03/15/2020 Primary Care Physican: [name], MD Primary Cardiologist:Bensimhon Electrophysiologist:Taylor LastWeight:184lbs  Transmission reviewed.  3/22/2021[employer] Heart Failure Index0which is within normal threshold.   Prescribed:  Torsemide20 mg take1tablet (20 mg total)Mon, Wed and Friday.  Potassium 20 mEq take 1 tablet daily.  Metolazone 2.5 mg takeone tablet as needed for weight gain of 5 lbs within 3 days.   Labs: 12/02/2019 Christine Mcdowell, [id number], [id number] 11/22/2019 Creatinine1.09, BUN7, Potassium 3.2, Sodium141, GFR59->60  11/10/2019 Creatinine0.91, BUN12, Potassium3.5, Sodium143, GFR>60 10/15/2019 Creatinine0.82, BUN18, Potassium4.1, Sodium140, GFR>60 08/18/2019 Creatinine1.01, BUN9, Potassium 4.3, Sodium142, GFR>60  08/14/2019 Creatinine1.03, BUN10, Potassium3.6, Sodium138, GFR>60  08/04/2019 Creatinine0.75, BUN16, Potassium3.7, Sodium140, GFR>60 A complete set of results can be found in Results Review.  Recommendations:None  Follow-up plan: ICM clinic phone appointment on4/26/2021. 91 day device clinic remote transmission 03/01/2020.   Copy of ICM check sent to Dr.Taylor.   3 Month Trend   Christine Billings, RN 03/15/2020 3:05 PM

## 2020-03-16 NOTE — Progress Notes (Signed)
Chronic Care Management   Visit Note  03/13/2020 Name: Christine Mcdowell MRN: [id number] DOB: [birth year]  Referred by: [name], MD Reason for referral : Chronic Care Management and Diabetes   Shevonne Wolf is a 49 y.o. year old female who is a primary care patient of [name], MD. The CCM team was consulted for assistance with chronic disease management and care coordination needs related to DMII  Review of patient status, including review of consultants reports, relevant laboratory and other test results, and collaboration with appropriate care team members and the patient's provider was performed as part of comprehensive patient evaluation and provision of chronic care management services.    SDOH (Social Determinants of Health) assessments performed: No See Care Plan activities for detailed interventions related to SDOH     Medications: Outpatient Encounter Medications as of 03/13/2020  Medication Sig  . Abatacept (ORENCIA) 125 MG/ML SOSY Inject into the skin once a week.  Marland Kitchen ACCU-CHEK FASTCLIX LANCETS MISC USE TO PRICK FINGER TO TEST BLOOD SUGAR BEFORE BREAKFAST AND DINNER  . albuterol (ACCUNEB) 1.25 MG/3ML nebulizer solution Take 3 mLs (1.25 mg total) by nebulization every 6 (six) hours as needed for wheezing.  Marland Kitchen amoxicillin (AMOXIL) 875 MG tablet Take 1 tablet (875 mg total) by mouth 2 (two) times daily.  Marland Kitchen aspirin 81 MG EC tablet Take by mouth daily. Taking 2 tablets  . atorvastatin (LIPITOR) 10 MG tablet TAKE 1 TABLET(10 MG) BY MOUTH DAILY  . Azelastine HCl 137 MCG/SPRAY SOLN Place 2 sprays into the nose daily.  . Bacillus Coagulans-Inulin (PROBIOTIC) 1-250 BILLION-MG CAPS Take 1 capsule by mouth daily.  . blood glucose meter kit and supplies KIT Dispense based on patient and insurance preference. Use up to four times daily as directed. (FOR ICD-9 250.00, 250.01). FILL BASED ON INSURANCE PREFERENCE.  Marland Kitchen Blood Glucose Monitoring Suppl (ACCU-CHEK AVIVA PLUS) w/Device  KIT DX: E11.9. Use as directed to test blood sugar  . carvedilol (COREG) 25 MG tablet TAKE 1 TABLET(25 MG) BY MOUTH TWICE DAILY WITH A MEAL  . Continuous Blood Gluc Receiver (DEXCOM G6 RECEIVER) DEVI 1 Device by Does not apply route 3 (three) times daily before meals.  . Continuous Blood Gluc Sensor (DEXCOM G6 SENSOR) MISC Inject 1 each into the skin 3 (three) times daily.  . Continuous Blood Gluc Transmit (DEXCOM G6 TRANSMITTER) MISC 1 Device by Does not apply route 3 (three) times daily before meals.  . dapagliflozin propanediol (FARXIGA) 10 MG TABS tablet Take 10 mg by mouth daily before breakfast. (Patient not taking: Reported on 03/07/2020)  . diclofenac sodium (VOLTAREN) 1 % GEL Apply 1 g topically as needed.  . dicyclomine (BENTYL) 20 MG tablet Take 1 tablet (20 mg total) by mouth 3 (three) times daily as needed for spasms. (Patient not taking: Reported on 03/07/2020)  . fluconazole (DIFLUCAN) 200 MG tablet Take 1 tablet daily for three days. (Patient not taking: Reported on 03/07/2020)  . folic acid (FOLVITE) 1 MG tablet TAKE 1 TABLET BY MOUTH EVERY MORNING (Patient taking differently: Take 1 mg by mouth daily. )  . glucose blood (ACCU-CHEK GUIDE) test strip DX: E11.9 Use as instructed to test blood sugars 3-4 times daily. (Patient not taking: Reported on 03/07/2020)  . hydrALAZINE (APRESOLINE) 100 MG tablet Take 1 tablet (100 mg total) by mouth 3 (three) times daily.  Marland Kitchen HYDROcodone-acetaminophen (NORCO) 5-325 MG tablet Take 1 tablet by mouth every 4 (four) hours as needed (for pain). (Patient not taking: Reported on 03/07/2020)  .  HYDROcodone-homatropine (HYDROMET) 5-1.5 MG/5ML syrup Take 5 mLs by mouth every 6 (six) hours as needed.  . hydroxychloroquine (PLAQUENIL) 200 MG tablet Take 1 tablet (200 mg total) by mouth 2 (two) times daily.  Marland Kitchen ibuprofen (ADVIL) 600 MG tablet Take 1 tablet (600 mg total) by mouth every 6 (six) hours as needed.  Marland Kitchen ipratropium (ATROVENT) 0.03 % nasal spray USE 2  SPRAYS IN EACH NOSTRIL THREE TIMES DAILY AS NEEDED FOR RHINITIS  . Magnesium 200 MG TABS Take 1 tablet by mouth daily.  . meloxicam (MOBIC) 15 MG tablet Take 15 mg by mouth daily.   . metFORMIN (GLUCOPHAGE) 500 MG tablet TAKE 1 TABLET BY MOUTH EVERY DAY WITH THE MORNING AND EVENING MEAL  . methotrexate (RHEUMATREX) 2.5 MG tablet Take 20 mg by mouth every Friday.   . metolazone (ZAROXOLYN) 2.5 MG tablet TAKE 1 TABLET BY MOUTH AS NEEDED FOR WEIGHT GAIN OF 5 POUNDS WITHIN 3 DAYS AS DIRECTED  . metroNIDAZOLE (METROGEL) 0.75 % vaginal gel Use vaginally every night for 5 nights.  . mometasone (NASONEX) 50 MCG/ACT nasal spray Place 2 sprays into the nose daily. (Patient not taking: Reported on 03/07/2020)  . montelukast (SINGULAIR) 10 MG tablet TAKE 1 TABLET BY MOUTH DAILY  . Multiple Vitamin (MULTIVITAMIN WITH MINERALS) TABS Take 1 tablet by mouth every morning.   Marland Kitchen NOVOLOG FLEXPEN 100 UNIT/ML FlexPen INJECT UP TO 17 UNITS THREE TIMES DAILY BY SLIDING SCALE AS DIRECTED  . ondansetron (ZOFRAN) 4 MG tablet Take 1 tablet (4 mg total) by mouth every 8 (eight) hours as needed for nausea or vomiting.  . potassium chloride SA (KLOR-CON) 20 MEQ tablet Take 1 tablet (20 mEq total) by mouth daily.  . predniSONE (DELTASONE) 20 MG tablet Take 2 tablets (40 mg total) by mouth daily.  Marland Kitchen PROAIR HFA 108 (90 Base) MCG/ACT inhaler INHALE 2 PUFFS BY MOUTH EVERY DAY AS NEEDED FOR SHORTNESS OF BREATH  . sacubitril-valsartan (ENTRESTO) 97-103 MG Take 1 tablet by mouth 2 (two) times daily.  Marland Kitchen spironolactone (ALDACTONE) 25 MG tablet TAKE 1 TABLET(25 MG) BY MOUTH TWICE DAILY  . torsemide (DEMADEX) 20 MG tablet Take 1 tablet Monday, Wednesday , and Friday  . traMADol (ULTRAM) 50 MG tablet Take 1 tablet (50 mg total) by mouth every 6 (six) hours as needed.  . traZODone (DESYREL) 50 MG tablet Take 50 mg by mouth at bedtime.  . TRESIBA FLEXTOUCH 100 UNIT/ML SOPN FlexTouch Pen INJECT 12 UNIT INTO THE SKIN EVERY NIGHT AT BEDTIME PER  INSULIN PROTOCOL  . Vitamin D, Ergocalciferol, (DRISDOL) 1.25 MG (50000 UT) CAPS capsule TAKE 1 CAPSULE BY MOUTH 2 TIMES EVERY WEEK   No facility-administered encounter medications on file as of 03/13/2020.     Objective:   Goals Addressed            This Visit's Progress     Patient Stated   . I need help getting a replacement Dexcom scanner glucometer (pt-stated)       Current Barriers:  . Issues replacing receiver due to insurance guidelines . Financial limitations to cover out of pocket costs  CCM PharmD Clinical Goal(s):  Marland Kitchen Over the next 60 days the patient will work with PCP and PharmD to address glucose monitoring system needs.  CCM PharmD Interventions: Completed call on 03/13/20 . Comprehensive medication review performed.  Updated medication list in the EMR. . Determined the patient needs assistance in obtaining a new receiver for her DexCom G6. The patient obtained this monitoring system  less than one year ago. Unfortunately, approximately two months ago the receiver was thrown away o Patient requested the Dexcom (receiver/transmitter) to be billed under her Medicaid/Care dual SNP plan-->will attempt - Patient recevied delivery on 12/13/2020--encouraged patient to call back as needed for additional Dexcom support o 03/13/20-->She is unable to refill the sensors due to pre-auth/prior auth needed.  Call placed to insurance that stated no authorization was needed.  Patient will need to follow up with insurance and have request faxed to PCP if additional information is needed.  Message sent to CCM RN CM, [place] regarding this information. o Patient continues to check BG ~6-8 times per day due to steroids and sliding scale insulin regimen . Assessed for patients ability to check blood sugar readings at this time   CCM RN CM Interventions  11/04/19 completed call with patient   . Collaboration with embedded Pharm D Lottie Dawson regarding Dexcom status . Discussed Almyra Free  will request authorization from Medicare to provide the [facility] transmitter . Discussed with patient she will continue to Self check CBG's with manual glucometer and record all readings . Reiterated best glucose control should reflect FBS at 80-130 . Reviewed and discussed indication, dosage and frequency of patient's current insulin and oral antihyperglycemic medications; reinforced importance of taking these medications exactly as prescribed w/o missed doses  . Patient Self Care Activities:  . Self administers medications as prescribed . Calls pharmacy for medication refills . Calls provider office for new concerns or questions   Please see past updates related to this goal by clicking on the "Past Updates" button in the selected goal       . I would like to control my blood sugar (pt-stated)       Current Barriers:  . Diabetes: T2DM; most recent A1c 6.3% on 12/02/19  . Current antihyperglycemic regimen: Tresiba 10 units qHS, Novolog sliding scale, Metformin o Discussed insulin/sliding scale; Patient uses AccuCheck GUIDE glucometer o Working to get patient scanner glucometer-Dexcom through [place] o 03/13/20: Madison Hickman at [facility] pre-auth for Dexcom refill [phone]) . Denies hypoglycemic symptoms; denies hyperglycemic symptoms . Current meal patterns: mindful of carbohydrate intake, avoids sugary drinks/desserts . Current exercise: walking as able . Current blood glucose readings: 150-200 (currently on prednisone '20mg'$  daily--plans to taper-- for autoimmune conditions which presents difficulty managing blodd sugars) . Cardiovascular risk reduction: o Current hypertensive regimen: carvedilol, spironolactone, hydralazine, entresto o Current hyperlipidemia regimen: atorvastatin, LDL 88 in 11/2019  Pharmacist Clinical Goal(s):  Marland Kitchen Over the next 90 days, patient with work with PharmD and primary care provider to address needs related to optimized medication management of chronic  conditions  Interventions: . Comprehensive medication review performed, medication list updated in electronic medical record.   . Reviewed & discussed the following diabetes-related information with patient: o Continue checking blood sugars as directed o Encouraged & reviewed ADA recommended "diabetes-friendly" diet  (reviewed healthy snack/food options) o Reviewed medication purpose/side effects-->patient denies adverse events   Patient Self Care Activities:  . Patient will check blood glucose 3-4 times daily, document, and provide at future appointments . Patient will focus on medication adherence by continuing to take medications as prescribed . Patient will take medications as prescribed . Patient will contact provider with any episodes of hypoglycemia . Patient will report any questions or concerns to provider   Please see past updates related to this goal by clicking on the "Past Updates" button in the selected goal  Provider Signature  Regina Eck, PharmD, BCPS Clinical Pharmacist, [facility] [place]: [phone]

## 2020-03-24 ENCOUNTER — Telehealth: Payer: Self-pay

## 2020-04-10 ENCOUNTER — Ambulatory Visit (INDEPENDENT_AMBULATORY_CARE_PROVIDER_SITE_OTHER): Payer: Medicare Other

## 2020-04-10 ENCOUNTER — Other Ambulatory Visit: Payer: Self-pay

## 2020-04-10 ENCOUNTER — Telehealth: Payer: Self-pay

## 2020-04-10 DIAGNOSIS — M329 Systemic lupus erythematosus, unspecified: Secondary | ICD-10-CM | POA: Diagnosis not present

## 2020-04-10 DIAGNOSIS — D688 Other specified coagulation defects: Secondary | ICD-10-CM | POA: Diagnosis not present

## 2020-04-10 DIAGNOSIS — J4520 Mild intermittent asthma, uncomplicated: Secondary | ICD-10-CM

## 2020-04-10 DIAGNOSIS — I10 Essential (primary) hypertension: Secondary | ICD-10-CM | POA: Diagnosis not present

## 2020-04-10 DIAGNOSIS — E1165 Type 2 diabetes mellitus with hyperglycemia: Secondary | ICD-10-CM | POA: Diagnosis not present

## 2020-04-10 DIAGNOSIS — Z794 Long term (current) use of insulin: Secondary | ICD-10-CM | POA: Diagnosis not present

## 2020-04-10 DIAGNOSIS — M2012 Hallux valgus (acquired), left foot: Secondary | ICD-10-CM | POA: Insufficient documentation

## 2020-04-10 DIAGNOSIS — E1169 Type 2 diabetes mellitus with other specified complication: Secondary | ICD-10-CM | POA: Diagnosis not present

## 2020-04-10 DIAGNOSIS — I5022 Chronic systolic (congestive) heart failure: Secondary | ICD-10-CM

## 2020-04-11 NOTE — Patient Instructions (Signed)
Visit Information  Goals Addressed      Patient Stated   . "I am having a Lupus flare and may be trying to have CHF" (pt-stated)       Current Barriers:  Marland Kitchen Knowledge Deficits related to diagnosis and treatment of CHF . Chronic Disease Management support and education needs related to DM, HTN, CHF, Lupus . Knowledge Deficits related to disease process and Self Health management of Lupus  Nurse Case Manager Clinical Goal(s):  Marland Kitchen Over the next 14 days, patient will work with PCP and or Cardiology to address needs related to symptoms suggestive of Lupus and or CHF   Goal Complete . Over the next 30 days, patient will keep all MD follow up appointments, she will adhere to taking all prescribed medications and follow all MD recommendations for symptoms management of CHF and Lupus Goal Met . New 04/10/20 Over the next 90 days, patient will demonstrate increased knowledge and understanding of how to Self manage CHF as evidence, patient will experience no ER visits and or IP admissions related to CHF   CCM RN CM Interventions:  04/10/20 call completed with patient  . Evaluation of current treatment plan related to CHF and patient's adherence to plan as established by provider. . Provided education to patient re: disease process and Self Health management of CHF; Reviewed and discussed patient recent IP post d/c orders and recommendations for follow up: Determined patient is experiencing profuse sweating and would like to have this further evaluated . Reviewed medications with patient and discussed indication, dosage and frequency of prescribed medications for CHF; Determined patient is adherent with taking her medications as prescribed w/o missed doses; She denies noted SE and or financial hardship . Collaborated with PCP provider [name], FNP  regarding patient's c/o profuse sweating and need for Vitamin D check; patient added to PCP scheduled for 04/12/20 '@4'$ :15 PM  . Discussed plans with patient  for ongoing care management follow up and provided patient with direct contact information for care management team . Provided patient with printed educational materials related to CHF Safety Zone Tool . Advised patient, providing education and rationale, to weigh daily and record, calling the CCM RN and or MD for weight gain of 3lbs overnight or 5 pounds in a week.   Patient Self Care Activities:  . Self administers medications as prescribed . Attends all scheduled provider appointments . Calls pharmacy for medication refills . Performs ADL's independently . Performs IADL's independently . Calls provider office for new concerns or questions  Please see past updates related to this goal by clicking on the "Past Updates" button in the selected goal     . "I am not taking any maintenance medications for my Asthma" (pt-stated)       Current Barriers:  Marland Kitchen Knowledge Deficits related to best treatment options for maintenance management of Asthma  . Chronic Disease Management support and education needs related to DM, HTN, CHF, Lupus, Asthma  Nurse Case Manager Clinical Goal(s):  Marland Kitchen New 04/10/20 Over the next 30 days, patient will verbalize understanding of plan for f/u with [place] for evaluation and treatment of Asthma  . New 04/10/20 Over the next 90 days, patient will demonstrate a decrease in Asthma exacerbations as evidenced by patient will experience no ED visits and or IP admissions secondary to Asthma exacerbation  CCM RN CM Interventions:  04/10/20 call completed with patient   . Evaluation of current treatment plan related to Asthma and patient's adherence to plan  as established by provider. . Provided education to patient re: disease process and best treatment recommendations for management of Asthma; reviewed and discussed post d/c instructions following IP admit for Asthma exacerbation and CHF . Reviewed medications with patient and discussed patient is not taking a  maintenance corticosteroid inhaler at this time; Determined she uses Albuterol as needed  . Collaborated with [facility] regarding recent referral for patient re-establishment wit h a new provider; advised patient prefers not to see Dr. Melvyn Novas; was advised the referral was received and the office will reach out to Ms. Gartley to schedule a new patient appointment  . Discussed plans with patient for ongoing care management follow up and provided patient with direct contact information for care management team . Provided patient with printed educational materials related to What Do You Want to Know About Asthma?  Patient Self Care Activities:  . Self administers medications as prescribed . Calls pharmacy for medication refills . Performs ADL's independently . Performs IADL's independently . Calls provider office for new concerns or questions  Please see past updates related to this goal by clicking on the "Past Updates" button in the selected goal      . "to get diabetes under good control" (pt-stated)       CARE PLAN ENTRY (see longitudinal plan of care for additional care plan information)  Current Barriers:  Marland Kitchen Knowledge Deficits related to disease process and Self Health management of DM  . Chronic Disease Management support and education needs related to DM, HTN, CHF  Nurse Case Manager Clinical Goal(s):  Marland Kitchen Over the next 30 days, patient will receive a determination from [facility] Medicare regarding approval of new Dexcom G6 scanner . Over the next 90 days, patient will work with the CCM team and PCP to address needs related to disease education and support to improve Self Health management of DM  CCM RN CM Interventions:  04/10/20 call completed with patient  . Inter-disciplinary care team collaboration (see longitudinal plan of care) . Evaluation of current treatment plan related to Diabetes Mellitus and patient's adherence to plan as established by provider. . Provided  education to patient re: current A1c; educated on target A1c and target FBS 80-130, <180 after meals  . Reviewed medications with patient and discussed indication, dosage and frequency of prescribed medications for DM; Determined patient is adherent with taking her medications exactly as prescribed; denies financial hardship paying for meds at this time: Determined patient has spoken with [facility] Medicare health plan regarding the need for a new Dexcom G6 Scanner and this will be covered by [facility] only once a new Rx is received  . Collaborated with PCP provider [name], FNP  regarding patient's request for a new Rx for a Dexom G6 Scanner be sent to [facility] to b ran through for approval by [facility] Medicare only  . Discussed plans with patient for ongoing care management follow up and provided patient with direct contact information for care management team . Provided patient with printed  educational materials related to Diabetes Safety Zone Management Tool  . Advised patient, providing education and rationale, to check cbg 2-3 times daily before meals and record, calling CCM RN CM and or PCP for findings outside established parameters.    Patient Self Care Activities:  . Self administers medications as prescribed . Attends all scheduled provider appointments . Calls pharmacy for medication refills . Performs ADL's independently . Performs IADL's independently . Calls provider office for new concerns or questions  Initial goal documentation     . COMPLETED: I need help getting a replacement Dexcom scanner glucometer (pt-stated)       Current Barriers:  . Issues replacing receiver due to insurance guidelines . Financial limitations to cover out of pocket costs  CCM PharmD Clinical Goal(s):  Marland Kitchen Over the next 60 days the patient will work with PCP and PharmD to address glucose monitoring system needs.  CCM PharmD Interventions: Completed call on 03/13/20 . Comprehensive medication review  performed.  Updated medication list in the EMR. . Determined the patient needs assistance in obtaining a new receiver for her DexCom G6. The patient obtained this monitoring system less than one year ago. Unfortunately, approximately two months ago the receiver was thrown away o Patient requested the Dexcom (receiver/transmitter) to be billed under her Medicaid/Care dual SNP plan-->will attempt - Patient recevied delivery on 12/13/2020--encouraged patient to call back as needed for additional Dexcom support o 03/13/20-->She is unable to refill the sensors due to pre-auth/prior auth needed.  Call placed to insurance that stated no authorization was needed.  Patient will need to follow up with insurance and have request faxed to PCP if additional information is needed.  Message sent to CCM RN CM, [place] regarding this information. o Patient continues to check BG ~6-8 times per day due to steroids and sliding scale insulin regimen . Assessed for patients ability to check blood sugar readings at this time  CCM RN CM Interventions  11/04/19 completed call with patient   . Collaboration with embedded Pharm D Lottie Dawson regarding Dexcom status . Discussed Almyra Free will request authorization from Medicare to provide the [facility] transmitter . Discussed with patient she will continue to Self check CBG's with manual glucometer and record all readings . Reiterated best glucose control should reflect FBS at 80-130 . Reviewed and discussed indication, dosage and frequency of patient's current insulin and oral antihyperglycemic medications; reinforced importance of taking these medications exactly as prescribed w/o missed doses  . Patient Self Care Activities:  . Self administers medications as prescribed . Calls pharmacy for medication refills . Calls provider office for new concerns or questions  Please see past updates related to this goal by clicking on the "Past Updates" button in the selected goal      . COMPLETED: I would like to control my blood sugar (pt-stated)       Current Barriers:  . Diabetes: T2DM; most recent A1c 6.3% on 12/02/19  . Current antihyperglycemic regimen: Tresiba 10 units qHS, Novolog sliding scale, Metformin o Discussed insulin/sliding scale; Patient uses AccuCheck GUIDE glucometer o Working to get patient scanner glucometer-Dexcom through [place] o 03/13/20: Madison Hickman at [facility] pre-auth for Dexcom refill [phone]) . Denies hypoglycemic symptoms; denies hyperglycemic symptoms . Current meal patterns: mindful of carbohydrate intake, avoids sugary drinks/desserts . Current exercise: walking as able . Current blood glucose readings: 150-200 (currently on prednisone '20mg'$  daily--plans to taper-- for autoimmune conditions which presents difficulty managing blodd sugars) . Cardiovascular risk reduction: o Current hypertensive regimen: carvedilol, spironolactone, hydralazine, entresto o Current hyperlipidemia regimen: atorvastatin, LDL 88 in 11/2019  Pharmacist Clinical Goal(s):  Marland Kitchen Over the next 90 days, patient with work with PharmD and primary care provider to address needs related to optimized medication management of chronic conditions  Interventions: . Comprehensive medication review performed, medication list updated in electronic medical record.   . Reviewed & discussed the following diabetes-related information with patient: o Continue checking blood sugars as directed o Encouraged & reviewed ADA  recommended "diabetes-friendly" diet  (reviewed healthy snack/food options) o Reviewed medication purpose/side effects-->patient denies adverse events  Patient Self Care Activities:  . Patient will check blood glucose 3-4 times daily, document, and provide at future appointments . Patient will focus on medication adherence by continuing to take medications as prescribed . Patient will take medications as prescribed . Patient will contact provider with any  episodes of hypoglycemia . Patient will report any questions or concerns to provider   Please see past updates related to this goal by clicking on the "Past Updates" button in the selected goal        Patient verbalizes understanding of instructions provided today.   Telephone follow up appointment with care management team member scheduled for: 05/09/20  Barb Merino, RN, BSN, CCM Care Management Coordinator [facility]  Direct Phone: [phone]

## 2020-04-11 NOTE — Chronic Care Management (AMB) (Signed)
Chronic Care Management   Follow Up Note   04/10/2020 Name: Christine Mcdowell MRN: [id number] DOB: 1971/10/09  Referred by: [name], MD Reason for referral : Chronic Care Management (FU RN Call - CHF/Asthma)   Christine Mcdowell is a 49 y.o. year old female who is a primary care patient of [name], MD. The CCM team was consulted for assistance with chronic disease management and care coordination needs.    Review of patient status, including review of consultants reports, relevant laboratory and other test results, and collaboration with appropriate care team members and the patient's provider was performed as part of comprehensive patient evaluation and provision of chronic care management services.    SDOH (Social Determinants of Health) assessments performed: Yes See Care Plan activities for detailed interventions related to SDOH)   Placed CCM RN CM outbound call to patient for a CCM follow up, care plan updated.     Outpatient Encounter Medications as of 04/10/2020  Medication Sig  . Abatacept (ORENCIA) 125 MG/ML SOSY Inject into the skin once a week.  Marland Kitchen ACCU-CHEK FASTCLIX LANCETS MISC USE TO PRICK FINGER TO TEST BLOOD SUGAR BEFORE BREAKFAST AND DINNER  . albuterol (ACCUNEB) 1.25 MG/3ML nebulizer solution Take 3 mLs (1.25 mg total) by nebulization every 6 (six) hours as needed for wheezing.  Marland Kitchen amoxicillin (AMOXIL) 875 MG tablet Take 1 tablet (875 mg total) by mouth 2 (two) times daily.  Marland Kitchen aspirin 81 MG EC tablet Take by mouth daily. Taking 2 tablets  . atorvastatin (LIPITOR) 10 MG tablet TAKE 1 TABLET(10 MG) BY MOUTH DAILY  . Azelastine HCl 137 MCG/SPRAY SOLN Place 2 sprays into the nose daily.  . Bacillus Coagulans-Inulin (PROBIOTIC) 1-250 BILLION-MG CAPS Take 1 capsule by mouth daily.  . blood glucose meter kit and supplies KIT Dispense based on patient and insurance preference. Use up to four times daily as directed. (FOR ICD-9 250.00, 250.01). FILL BASED ON  INSURANCE PREFERENCE.  Marland Kitchen Blood Glucose Monitoring Suppl (ACCU-CHEK AVIVA PLUS) w/Device KIT DX: E11.9. Use as directed to test blood sugar  . carvedilol (COREG) 25 MG tablet TAKE 1 TABLET(25 MG) BY MOUTH TWICE DAILY WITH A MEAL  . Continuous Blood Gluc Receiver (DEXCOM G6 RECEIVER) DEVI 1 Device by Does not apply route 3 (three) times daily before meals.  . Continuous Blood Gluc Sensor (DEXCOM G6 SENSOR) MISC Inject 1 each into the skin 3 (three) times daily.  . Continuous Blood Gluc Transmit (DEXCOM G6 TRANSMITTER) MISC 1 Device by Does not apply route 3 (three) times daily before meals.  . dapagliflozin propanediol (FARXIGA) 10 MG TABS tablet Take 10 mg by mouth daily before breakfast. (Patient not taking: Reported on 03/07/2020)  . diclofenac sodium (VOLTAREN) 1 % GEL Apply 1 g topically as needed.  . dicyclomine (BENTYL) 20 MG tablet Take 1 tablet (20 mg total) by mouth 3 (three) times daily as needed for spasms. (Patient not taking: Reported on 03/07/2020)  . fluconazole (DIFLUCAN) 200 MG tablet Take 1 tablet daily for three days. (Patient not taking: Reported on 03/07/2020)  . folic acid (FOLVITE) 1 MG tablet TAKE 1 TABLET BY MOUTH EVERY MORNING (Patient taking differently: Take 1 mg by mouth daily. )  . glucose blood (ACCU-CHEK GUIDE) test strip DX: E11.9 Use as instructed to test blood sugars 3-4 times daily. (Patient not taking: Reported on 03/07/2020)  . hydrALAZINE (APRESOLINE) 100 MG tablet Take 1 tablet (100 mg total) by mouth 3 (three) times daily.  Marland Kitchen HYDROcodone-acetaminophen (NORCO) 5-325 MG tablet  Take 1 tablet by mouth every 4 (four) hours as needed (for pain). (Patient not taking: Reported on 03/07/2020)  . HYDROcodone-homatropine (HYDROMET) 5-1.5 MG/5ML syrup Take 5 mLs by mouth every 6 (six) hours as needed.  . hydroxychloroquine (PLAQUENIL) 200 MG tablet Take 1 tablet (200 mg total) by mouth 2 (two) times daily.  Marland Kitchen ibuprofen (ADVIL) 600 MG tablet Take 1 tablet (600 mg total) by mouth  every 6 (six) hours as needed.  Marland Kitchen ipratropium (ATROVENT) 0.03 % nasal spray USE 2 SPRAYS IN EACH NOSTRIL THREE TIMES DAILY AS NEEDED FOR RHINITIS  . Magnesium 200 MG TABS Take 1 tablet by mouth daily.  . meloxicam (MOBIC) 15 MG tablet Take 15 mg by mouth daily.   . metFORMIN (GLUCOPHAGE) 500 MG tablet TAKE 1 TABLET BY MOUTH EVERY DAY WITH THE MORNING AND EVENING MEAL  . methotrexate (RHEUMATREX) 2.5 MG tablet Take 20 mg by mouth every Friday.   . metolazone (ZAROXOLYN) 2.5 MG tablet TAKE 1 TABLET BY MOUTH AS NEEDED FOR WEIGHT GAIN OF 5 POUNDS WITHIN 3 DAYS AS DIRECTED  . metroNIDAZOLE (METROGEL) 0.75 % vaginal gel Use vaginally every night for 5 nights.  . mometasone (NASONEX) 50 MCG/ACT nasal spray Place 2 sprays into the nose daily. (Patient not taking: Reported on 03/07/2020)  . montelukast (SINGULAIR) 10 MG tablet TAKE 1 TABLET BY MOUTH DAILY  . Multiple Vitamin (MULTIVITAMIN WITH MINERALS) TABS Take 1 tablet by mouth every morning.   Marland Kitchen NOVOLOG FLEXPEN 100 UNIT/ML FlexPen INJECT UP TO 17 UNITS THREE TIMES DAILY BY SLIDING SCALE AS DIRECTED  . ondansetron (ZOFRAN) 4 MG tablet Take 1 tablet (4 mg total) by mouth every 8 (eight) hours as needed for nausea or vomiting.  . potassium chloride SA (KLOR-CON) 20 MEQ tablet Take 1 tablet (20 mEq total) by mouth daily.  . predniSONE (DELTASONE) 20 MG tablet Take 2 tablets (40 mg total) by mouth daily.  Marland Kitchen PROAIR HFA 108 (90 Base) MCG/ACT inhaler INHALE 2 PUFFS BY MOUTH EVERY DAY AS NEEDED FOR SHORTNESS OF BREATH  . sacubitril-valsartan (ENTRESTO) 97-103 MG Take 1 tablet by mouth 2 (two) times daily.  Marland Kitchen spironolactone (ALDACTONE) 25 MG tablet TAKE 1 TABLET(25 MG) BY MOUTH TWICE DAILY  . torsemide (DEMADEX) 20 MG tablet Take 1 tablet Monday, Wednesday , and Friday  . traMADol (ULTRAM) 50 MG tablet Take 1 tablet (50 mg total) by mouth every 6 (six) hours as needed.  . traZODone (DESYREL) 50 MG tablet Take 50 mg by mouth at bedtime.  . TRESIBA FLEXTOUCH 100  UNIT/ML SOPN FlexTouch Pen INJECT 12 UNIT INTO THE SKIN EVERY NIGHT AT BEDTIME PER INSULIN PROTOCOL  . Vitamin D, Ergocalciferol, (DRISDOL) 1.25 MG (50000 UT) CAPS capsule TAKE 1 CAPSULE BY MOUTH 2 TIMES EVERY WEEK   No facility-administered encounter medications on file as of 04/10/2020.     Objective:  Lab Results  Component Value Date   HGBA1C 6.3 (H) 12/02/2019   HGBA1C 6.4 (H) 06/18/2019   HGBA1C 6.0 (H) 03/29/2019   Lab Results  Component Value Date   MICROALBUR 10 08/24/2019   LDLCALC 88 12/02/2019   CREATININE 0.88 01/31/2020   BP Readings from Last 3 Encounters:  03/08/20 119/69  03/07/20 138/85  01/31/20 125/67    Goals Addressed      Patient Stated   . "I am having a Lupus flare and may be trying to have CHF" (pt-stated)       Current Barriers:  Marland Kitchen Knowledge Deficits related to diagnosis  and treatment of CHF . Chronic Disease Management support and education needs related to DM, HTN, CHF, Lupus . Knowledge Deficits related to disease process and Self Health management of Lupus  Nurse Case Manager Clinical Goal(s):  Marland Kitchen Over the next 14 days, patient will work with PCP and or Cardiology to address needs related to symptoms suggestive of Lupus and or CHF   Goal Complete . Over the next 30 days, patient will keep all MD follow up appointments, she will adhere to taking all prescribed medications and follow all MD recommendations for symptoms management of CHF and Lupus Goal Met . New 04/10/20 Over the next 90 days, patient will demonstrate increased knowledge and understanding of how to Self manage CHF as evidence, patient will experience no ER visits and or IP admissions related to CHF   CCM RN CM Interventions:  04/10/20 call completed with patient  . Evaluation of current treatment plan related to CHF and patient's adherence to plan as established by provider. . Provided education to patient re: disease process and Self Health management of CHF; Reviewed and discussed  patient recent IP post d/c orders and recommendations for follow up: Determined patient is experiencing profuse sweating and would like to have this further evaluated . Reviewed medications with patient and discussed indication, dosage and frequency of prescribed medications for CHF; Determined patient is adherent with taking her medications as prescribed w/o missed doses; She denies noted SE and or financial hardship . Collaborated with PCP provider [name], FNP  regarding patient's c/o profuse sweating and need for Vitamin D check; patient added to PCP scheduled for 04/12/20 '@4'$ :15 PM  . Discussed plans with patient for ongoing care management follow up and provided patient with direct contact information for care management team . Provided patient with printed educational materials related to CHF Safety Zone Tool . Advised patient, providing education and rationale, to weigh daily and record, calling the CCM RN and or MD for weight gain of 3lbs overnight or 5 pounds in a week.   Patient Self Care Activities:  . Self administers medications as prescribed . Attends all scheduled provider appointments . Calls pharmacy for medication refills . Performs ADL's independently . Performs IADL's independently . Calls provider office for new concerns or questions  Please see past updates related to this goal by clicking on the "Past Updates" button in the selected goal      . "I am not taking any maintenance medications for my Asthma" (pt-stated)       Current Barriers:  Marland Kitchen Knowledge Deficits related to best treatment options for maintenance management of Asthma  . Chronic Disease Management support and education needs related to DM, HTN, CHF, Lupus, Asthma  Nurse Case Manager Clinical Goal(s):  Marland Kitchen New 04/10/20 Over the next 30 days, patient will verbalize understanding of plan for f/u with [facility] for evaluation and treatment of Asthma  . New 04/10/20 Over the next 90 days, patient  will demonstrate a decrease in Asthma exacerbations as evidenced by patient will experience no ED visits and or IP admissions secondary to Asthma exacerbation  CCM RN CM Interventions:  04/10/20 call completed with patient   . Evaluation of current treatment plan related to Asthma and patient's adherence to plan as established by provider. . Provided education to patient re: disease process and best treatment recommendations for management of Asthma; reviewed and discussed post d/c instructions following IP admit for Asthma exacerbation and CHF . Reviewed medications with patient and discussed patient  is not taking a maintenance corticosteroid inhaler at this time; Determined she uses Albuterol as needed  . Collaborated with [facility] regarding recent referral for patient re-establishment wit h a new provider; advised patient prefers not to see Dr. Melvyn Novas; was advised the referral was received and the office will reach out to Ms. Behney to schedule a new patient appointment  . Discussed plans with patient for ongoing care management follow up and provided patient with direct contact information for care management team . Provided patient with printed educational materials related to What Do You Want to Know About Asthma?  Patient Self Care Activities:  . Self administers medications as prescribed . Calls pharmacy for medication refills . Performs ADL's independently . Performs IADL's independently . Calls provider office for new concerns or questions  Please see past updates related to this goal by clicking on the "Past Updates" button in the selected goal     . "to get diabetes under good control" (pt-stated)       CARE PLAN ENTRY (see longitudinal plan of care for additional care plan information)  Current Barriers:  Marland Kitchen Knowledge Deficits related to disease process and Self Health management of DM  . Chronic Disease Management support and education needs related to DM, HTN,  CHF  Nurse Case Manager Clinical Goal(s):  Marland Kitchen Over the next 30 days, patient will receive a determination from [facility] Medicare regarding approval of new Dexcom G6 scanner . Over the next 90 days, patient will work with the CCM team and PCP to address needs related to disease education and support to improve Self Health management of DM  CCM RN CM Interventions:  04/10/20 call completed with patient  . Inter-disciplinary care team collaboration (see longitudinal plan of care) . Evaluation of current treatment plan related to Diabetes Mellitus and patient's adherence to plan as established by provider. . Provided education to patient re: current A1c; educated on target A1c and target FBS 80-130, <180 after meals  . Reviewed medications with patient and discussed indication, dosage and frequency of prescribed medications for DM; Determined patient is adherent with taking her medications exactly as prescribed; denies financial hardship paying for meds at this time: Determined patient has spoken with [facility] Medicare health plan regarding the need for a new Dexcom G6 Scanner and this will be covered by [facility] only once a new Rx is received  . Collaborated with PCP provider [name], FNP  regarding patient's request for a new Rx for a Dexom G6 Scanner be sent to [facility] to b ran through for approval by [facility] Medicare only  . Discussed plans with patient for ongoing care management follow up and provided patient with direct contact information for care management team . Provided patient with printed  educational materials related to Diabetes Safety Zone Management Tool  . Advised patient, providing education and rationale, to check cbg 2-3 times daily before meals and record, calling CCM RN CM and or PCP for findings outside established parameters.    Patient Self Care Activities:  . Self administers medications as prescribed . Attends all scheduled provider appointments . Calls pharmacy for medication  refills . Performs ADL's independently . Performs IADL's independently . Calls provider office for new concerns or questions  Initial goal documentation    . COMPLETED: I need help getting a replacement Dexcom scanner glucometer (pt-stated)       Current Barriers:  . Issues replacing receiver due to insurance guidelines . Financial limitations to cover out of  pocket costs  CCM PharmD Clinical Goal(s):  Marland Kitchen Over the next 60 days the patient will work with PCP and PharmD to address glucose monitoring system needs.  CCM PharmD Interventions: Completed call on 03/13/20 . Comprehensive medication review performed.  Updated medication list in the EMR. . Determined the patient needs assistance in obtaining a new receiver for her DexCom G6. The patient obtained this monitoring system less than one year ago. Unfortunately, approximately two months ago the receiver was thrown away o Patient requested the Dexcom (receiver/transmitter) to be billed under her Medicaid/Care dual SNP plan-->will attempt - Patient recevied delivery on 12/13/2020--encouraged patient to call back as needed for additional Dexcom support o 03/13/20-->She is unable to refill the sensors due to pre-auth/prior auth needed.  Call placed to insurance that stated no authorization was needed.  Patient will need to follow up with insurance and have request faxed to PCP if additional information is needed.  Message sent to CCM RN CM, [place] regarding this information. o Patient continues to check BG ~6-8 times per day due to steroids and sliding scale insulin regimen . Assessed for patients ability to check blood sugar readings at this time  CCM RN CM Interventions  11/04/19 completed call with patient   . Collaboration with embedded Pharm D Lottie Dawson regarding Dexcom status . Discussed Almyra Free will request authorization from Medicare to provide the [facility] transmitter . Discussed with patient she will continue to Self check  CBG's with manual glucometer and record all readings . Reiterated best glucose control should reflect FBS at 80-130 . Reviewed and discussed indication, dosage and frequency of patient's current insulin and oral antihyperglycemic medications; reinforced importance of taking these medications exactly as prescribed w/o missed doses  . Patient Self Care Activities:  . Self administers medications as prescribed . Calls pharmacy for medication refills . Calls provider office for new concerns or questions  Please see past updates related to this goal by clicking on the "Past Updates" button in the selected goal     . COMPLETED: I would like to control my blood sugar (pt-stated)       Current Barriers:  . Diabetes: T2DM; most recent A1c 6.3% on 12/02/19  . Current antihyperglycemic regimen: Tresiba 10 units qHS, Novolog sliding scale, Metformin o Discussed insulin/sliding scale; Patient uses AccuCheck GUIDE glucometer o Working to get patient scanner glucometer-Dexcom through [place] o 03/13/20: Madison Hickman at [facility] pre-auth for Dexcom refill [phone]) . Denies hypoglycemic symptoms; denies hyperglycemic symptoms . Current meal patterns: mindful of carbohydrate intake, avoids sugary drinks/desserts . Current exercise: walking as able . Current blood glucose readings: 150-200 (currently on prednisone '20mg'$  daily--plans to taper-- for autoimmune conditions which presents difficulty managing blodd sugars) . Cardiovascular risk reduction: o Current hypertensive regimen: carvedilol, spironolactone, hydralazine, entresto o Current hyperlipidemia regimen: atorvastatin, LDL 88 in 11/2019  Pharmacist Clinical Goal(s):  Marland Kitchen Over the next 90 days, patient with work with PharmD and primary care provider to address needs related to optimized medication management of chronic conditions  Interventions: . Comprehensive medication review performed, medication list updated in electronic medical record.     . Reviewed & discussed the following diabetes-related information with patient: o Continue checking blood sugars as directed o Encouraged & reviewed ADA recommended "diabetes-friendly" diet  (reviewed healthy snack/food options) o Reviewed medication purpose/side effects-->patient denies adverse events  Patient Self Care Activities:  . Patient will check blood glucose 3-4 times daily, document, and provide at future appointments . Patient will focus on  medication adherence by continuing to take medications as prescribed . Patient will take medications as prescribed . Patient will contact provider with any episodes of hypoglycemia . Patient will report any questions or concerns to provider   Please see past updates related to this goal by clicking on the "Past Updates" button in the selected goal        Plan:   Telephone follow up appointment with care management team member scheduled for: 05/09/20  Barb Merino, RN, BSN, CCM Care Management Coordinator [facility]  Direct Phone: [phone]

## 2020-04-12 ENCOUNTER — Encounter: Payer: Self-pay | Admitting: Nurse Practitioner

## 2020-04-12 ENCOUNTER — Other Ambulatory Visit: Payer: Self-pay

## 2020-04-12 ENCOUNTER — Ambulatory Visit: Payer: Self-pay | Admitting: Internal Medicine

## 2020-04-12 ENCOUNTER — Ambulatory Visit (INDEPENDENT_AMBULATORY_CARE_PROVIDER_SITE_OTHER): Payer: Medicare Other | Admitting: Nurse Practitioner

## 2020-04-12 VITALS — BP 118/74 | HR 79 | Temp 98.4°F | Ht 66.0 in | Wt 191.2 lb

## 2020-04-12 DIAGNOSIS — E559 Vitamin D deficiency, unspecified: Secondary | ICD-10-CM | POA: Diagnosis not present

## 2020-04-12 DIAGNOSIS — M545 Low back pain, unspecified: Secondary | ICD-10-CM

## 2020-04-12 DIAGNOSIS — R61 Generalized hyperhidrosis: Secondary | ICD-10-CM | POA: Diagnosis not present

## 2020-04-12 DIAGNOSIS — Z8616 Personal history of COVID-19: Secondary | ICD-10-CM

## 2020-04-12 MED ORDER — DEXCOM G6 SENSOR MISC: 1.0000 | Freq: Three times a day (TID) | 1 refills | Status: DC

## 2020-04-12 MED ORDER — DEXCOM G6 RECEIVER DEVI: 1.0000 | Freq: Three times a day (TID) | 1 refills | Status: DC

## 2020-04-12 MED ORDER — DEXCOM G6 TRANSMITTER MISC: 1.0000 | Freq: Three times a day (TID) | 1 refills | Status: DC

## 2020-04-12 NOTE — Progress Notes (Signed)
This visit occurred during the SARS-CoV-2 public health emergency.  Safety protocols were in place, including screening questions prior to the visit, additional usage of staff PPE, and extensive cleaning of exam room while observing appropriate contact time as indicated for disinfecting solutions.  Subjective:     Patient ID: Christine Mcdowell , female    DOB: 1971/01/09 , 49 y.o.   MRN: [id number]   Chief Complaint  Patient presents with  . profuse sweating    vitamin D check    HPI  She reports ever since having covid she can break out in cold sweats in her face. She also has back pain after having covid pneumonia. She does keep the house cool.   She is having left foot surgery Dr. Rock Nephew at [facility] she had an HgbA1c done yesterday was 6.9.  She did have monoclonal antibodies for her covid - March 05, 2020.    Back Pain This is a recurrent problem. The current episode started more than 1 month ago. The problem occurs constantly. The pain is present in the lumbar spine. The quality of the pain is described as aching. The pain does not radiate. Pertinent negatives include no abdominal pain, chest pain or headaches.     Past Medical History:  Diagnosis Date  . AICD (automatic cardioverter/defibrillator) present 01/28/2018  . Anemia   . Anginal pain ([place])   . Asthma   . Cervical cancer ([place])   . CHF (congestive heart failure) ([place])   . Diabetes mellitus without complication ([place])    steroid induced  . Discoid lupus   . Fibromyalgia   . History of blood transfusion "several"   "related to anemia; had some w/hysterectomy also"  . Hx of cardiovascular stress test    ETT-Myoview (9/15):  No ischemia, EF 52%; NORMAL  . Hx of echocardiogram    Echo (9/15):  EF 50-55%, ant HK, Gr 1 DD, mild MR, mild LAE, no effusion  . Hypertension   . Iron deficiency anemia    h/o iron transfusions  . Lupus (systemic lupus erythematosus) ([place])   . Migraine    "a few/year" (07/03/2016)  .  Pneumonia 12/2015  . RA (rheumatoid arthritis) ([place])    "all over" (07/03/2016)  . Sickle cell trait ([place])   . Stroke ([place]) 2014 X 1; 2015 X 2; 2016 X 1;    "right side of face more relaxed than the other; rare speech hesitation" (07/03/2016)  . Vaginal Pap smear, abnormal    ASCUS; HPV     Family History  Problem Relation Age of Onset  . Arthritis Mother   . Heart murmur Mother   . Drug abuse Mother   . Allergies Mother   . Heart attack Father   . Cushing syndrome Father   . Depression Father   . Allergies Father   . Dementia Paternal Grandmother   . Cancer Paternal Grandfather   . Diabetes Maternal Grandmother   . Hypertension Maternal Grandmother   . Asthma Maternal Grandmother   . Heart attack Maternal Grandfather      Current Outpatient Medications:  .  albuterol (ACCUNEB) 1.25 MG/3ML nebulizer solution, Take 3 mLs (1.25 mg total) by nebulization every 6 (six) hours as needed for wheezing., Disp: 75 mL, Rfl: 12 .  aspirin 81 MG EC tablet, Take by mouth daily. Taking 2 tablets, Disp: , Rfl:  .  atorvastatin (LIPITOR) 10 MG tablet, TAKE 1 TABLET(10 MG) BY MOUTH DAILY, Disp: 90 tablet, Rfl: 0 .  Azelastine  HCl 137 MCG/SPRAY SOLN, Place 2 sprays into the nose daily., Disp: 30 mL, Rfl: 3 .  Bacillus Coagulans-Inulin (PROBIOTIC) 1-250 BILLION-MG CAPS, Take 1 capsule by mouth daily., Disp: 30 capsule, Rfl: 2 .  carvedilol (COREG) 25 MG tablet, TAKE 1 TABLET(25 MG) BY MOUTH TWICE DAILY WITH A MEAL, Disp: 180 tablet, Rfl: 3 .  folic acid (FOLVITE) 1 MG tablet, TAKE 1 TABLET BY MOUTH EVERY MORNING (Patient taking differently: Take 1 mg by mouth daily. ), Disp: 30 tablet, Rfl: 0 .  hydrALAZINE (APRESOLINE) 100 MG tablet, Take 1 tablet (100 mg total) by mouth 3 (three) times daily., Disp: 90 tablet, Rfl: 6 .  hydroxychloroquine (PLAQUENIL) 200 MG tablet, Take 1 tablet (200 mg total) by mouth 2 (two) times daily., Disp: 180 tablet, Rfl: 0 .  ipratropium (ATROVENT) 0.03 % nasal spray, USE  2 SPRAYS IN EACH NOSTRIL THREE TIMES DAILY AS NEEDED FOR RHINITIS, Disp: 30 mL, Rfl: 2 .  Magnesium 200 MG TABS, Take 1 tablet by mouth daily., Disp: , Rfl:  .  meloxicam (MOBIC) 15 MG tablet, Take 15 mg by mouth daily. , Disp: , Rfl:  .  metFORMIN (GLUCOPHAGE) 500 MG tablet, TAKE 1 TABLET BY MOUTH EVERY DAY WITH THE MORNING AND EVENING MEAL, Disp: 180 tablet, Rfl: 1 .  methotrexate (RHEUMATREX) 2.5 MG tablet, Take 20 mg by mouth every Friday. , Disp: , Rfl: 3 .  metolazone (ZAROXOLYN) 2.5 MG tablet, TAKE 1 TABLET BY MOUTH AS NEEDED FOR WEIGHT GAIN OF 5 POUNDS WITHIN 3 DAYS AS DIRECTED, Disp: 5 tablet, Rfl: 3 .  montelukast (SINGULAIR) 10 MG tablet, TAKE 1 TABLET BY MOUTH DAILY, Disp: 90 tablet, Rfl: 1 .  Multiple Vitamin (MULTIVITAMIN WITH MINERALS) TABS, Take 1 tablet by mouth every morning. , Disp: , Rfl:  .  NOVOLOG FLEXPEN 100 UNIT/ML FlexPen, INJECT UP TO 17 UNITS THREE TIMES DAILY BY SLIDING SCALE AS DIRECTED, Disp: 5 pen, Rfl: 2 .  ondansetron (ZOFRAN) 4 MG tablet, Take 1 tablet (4 mg total) by mouth every 8 (eight) hours as needed for nausea or vomiting., Disp: 20 tablet, Rfl: 0 .  potassium chloride SA (KLOR-CON) 20 MEQ tablet, Take 1 tablet (20 mEq total) by mouth daily., Disp: 90 tablet, Rfl: 3 .  PROAIR HFA 108 (90 Base) MCG/ACT inhaler, INHALE 2 PUFFS BY MOUTH EVERY DAY AS NEEDED FOR SHORTNESS OF BREATH, Disp: 18 g, Rfl: 2 .  sacubitril-valsartan (ENTRESTO) 97-103 MG, Take 1 tablet by mouth 2 (two) times daily., Disp: 60 tablet, Rfl: 6 .  spironolactone (ALDACTONE) 25 MG tablet, TAKE 1 TABLET(25 MG) BY MOUTH TWICE DAILY, Disp: 180 tablet, Rfl: 3 .  torsemide (DEMADEX) 20 MG tablet, Take 1 tablet Monday, Wednesday , and Friday, Disp: 15 tablet, Rfl: 6 .  traMADol (ULTRAM) 50 MG tablet, Take 1 tablet (50 mg total) by mouth every 6 (six) hours as needed., Disp: 20 tablet, Rfl: 0 .  traZODone (DESYREL) 50 MG tablet, Take 50 mg by mouth at bedtime., Disp: , Rfl:  .  Vitamin D,  Ergocalciferol, (DRISDOL) 1.25 MG (50000 UT) CAPS capsule, TAKE 1 CAPSULE BY MOUTH 2 TIMES EVERY WEEK, Disp: 24 capsule, Rfl: 0 .  Continuous Blood Gluc Receiver (DEXCOM G6 RECEIVER) DEVI, 1 Device by Does not apply route 3 (three) times daily before meals., Disp: 1 each, Rfl: 1 .  Continuous Blood Gluc Sensor (DEXCOM G6 SENSOR) MISC, Inject 1 each into the skin 3 (three) times daily., Disp: 3 each, Rfl: 1 .  Continuous Blood Gluc Transmit (DEXCOM G6 TRANSMITTER) MISC, 1 Device by Does not apply route 3 (three) times daily before meals., Disp: 1 each, Rfl: 1   Allergies  Allergen Reactions  . Hydromorphone Nausea And Vomiting    Other reaction(s): GI Upset (intolerance), Hypertension (intolerance) Raises blood pressure  Other reaction(s): GI Upset (intolerance), Hypertension (intolerance) Raises blood pressure to stroke level  . Iodinated Diagnostic Agents Other (See Comments)    Shuts down kidneys Shuts kidney function down  . Other Other (See Comments) and Anaphylaxis    Spicy foods and seasonings Skin Prep "makes my skin peel off" Paper tape causes skin burns  . Erythromycin Nausea And Vomiting  . Latex Hives  . Tape Other (See Comments)    "Skin burns"  . Mircette [Desogestrel-Ethinyl Estradiol] Nausea And Vomiting and Rash     Review of Systems  Constitutional: Negative.   Respiratory: Negative.   Cardiovascular: Negative for chest pain.  Gastrointestinal: Negative for abdominal pain.  Endocrine:       Increasing in sweating  Musculoskeletal: Positive for back pain.  Neurological: Negative for dizziness and headaches.  Psychiatric/Behavioral: Negative.      Today's Vitals   04/12/20 1610  BP: 118/74  Pulse: 79  Temp: 98.4 F (36.9 C)  TempSrc: Oral  SpO2: 90%  Weight: 191 lb 3.2 oz (86.7 kg)  Height: 5\' 6"  (1.676 m)   Body mass index is 30.86 kg/m.   Objective:  Physical Exam Vitals reviewed.  Constitutional:      General: She is not in acute distress.     Appearance: Normal appearance. She is obese.  Cardiovascular:     Rate and Rhythm: Normal rate and regular rhythm.     Pulses: Normal pulses.     Heart sounds: Normal heart sounds. No murmur.  Pulmonary:     Effort: Pulmonary effort is normal. No respiratory distress.     Breath sounds: Normal breath sounds.  Musculoskeletal:        General: No tenderness. Normal range of motion.  Skin:    General: Skin is warm and dry.     Capillary Refill: Capillary refill takes less than 2 seconds.  Neurological:     General: No focal deficit present.     Mental Status: She is alert and oriented to person, place, and time.  Psychiatric:        Mood and Affect: Mood normal.        Behavior: Behavior normal.        Thought Content: Thought content normal.        Judgment: Judgment normal.         Assessment And Plan:    1. History of COVID-19  Will recheck cxr she had one done with [facility] and was told she had covid pneumonia will recheck cxr  I am also referring her to pulmonology due to her history of breathing issues.  She is advised to wait until June before getting her covid vaccine due to having the monoclonal antibodies infusion - DG Chest 2 View; Future - Ambulatory referral to Pulmonology  2. Sweating profusely  Will check to see if related to metabolic or hormonal cause  According to the CDC there are some lingering effects from covid related to sweating. - TSH - FSH - LH  3. Vitamin D deficiency  Will check vitamin d, has not been checked in quite some time and would like a refill - VITAMIN D 25 Hydroxy (Vit-D Deficiency, Fractures)  4. Acute bilateral low back pain without sciatica  Has been occurring since having covid in march worse at night   I will refer to PT for evaluation  - Ambulatory referral to Physical Therapy   [name], FNP    THE PATIENT IS ENCOURAGED TO PRACTICE SOCIAL DISTANCING DUE TO THE COVID-19 PANDEMIC.

## 2020-04-13 LAB — TSH: TSH: 0.652 u[IU]/mL (ref 0.450–4.500)

## 2020-04-13 LAB — VITAMIN D 25 HYDROXY (VIT D DEFICIENCY, FRACTURES): Vit D, 25-Hydroxy: 43.2 ng/mL (ref 30.0–100.0)

## 2020-04-13 LAB — LUTEINIZING HORMONE: LH: 44.6 m[IU]/mL

## 2020-04-13 LAB — FOLLICLE STIMULATING HORMONE: FSH: 94.9 m[IU]/mL

## 2020-04-14 DIAGNOSIS — Z794 Long term (current) use of insulin: Secondary | ICD-10-CM | POA: Diagnosis not present

## 2020-04-14 DIAGNOSIS — E119 Type 2 diabetes mellitus without complications: Secondary | ICD-10-CM | POA: Diagnosis not present

## 2020-04-15 ENCOUNTER — Other Ambulatory Visit: Payer: Self-pay | Admitting: Nurse Practitioner

## 2020-04-17 ENCOUNTER — Other Ambulatory Visit (HOSPITAL_COMMUNITY): Payer: Self-pay | Admitting: *Deleted

## 2020-04-17 ENCOUNTER — Encounter (HOSPITAL_COMMUNITY): Payer: Self-pay

## 2020-04-17 ENCOUNTER — Other Ambulatory Visit (HOSPITAL_COMMUNITY): Payer: Self-pay

## 2020-04-17 ENCOUNTER — Ambulatory Visit (INDEPENDENT_AMBULATORY_CARE_PROVIDER_SITE_OTHER): Payer: Medicare Other

## 2020-04-17 DIAGNOSIS — I5022 Chronic systolic (congestive) heart failure: Secondary | ICD-10-CM

## 2020-04-17 DIAGNOSIS — Z95810 Presence of automatic (implantable) cardiac defibrillator: Secondary | ICD-10-CM

## 2020-04-17 MED ORDER — CARVEDILOL 25 MG PO TABS: ORAL_TABLET | ORAL | 3 refills | Status: DC

## 2020-04-17 MED ORDER — HYDRALAZINE HCL 100 MG PO TABS: 100.0000 mg | ORAL_TABLET | Freq: Three times a day (TID) | ORAL | 6 refills | Status: DC

## 2020-04-17 MED ORDER — SACUBITRIL-VALSARTAN 97-103 MG PO TABS: 1.0000 | ORAL_TABLET | Freq: Two times a day (BID) | ORAL | 6 refills | Status: DC

## 2020-04-17 NOTE — Progress Notes (Signed)
EPIC Encounter for ICM Monitoring  Patient Name: Morissa Vonruden is a 49 y.o. female Date: 04/17/2020 Primary Care Physican: [name], MD Primary Cardiologist:Bensimhon Electrophysiologist:Taylor LastWeight:184lbs  Transmission reviewed.  4/25/2021HeartLogic Heart Failure Index8 which is within normal threshold.   Prescribed:  Torsemide20 mg take1tablet (20 mg total)Mon, Wed and Friday.  Potassium 20 mEq take 1 tablet daily.  Metolazone 2.5 mg takeone tablet as needed for weight gain of 5 lbs within 3 days.   Labs: 12/02/2019 [name], [id number], [id number] 11/22/2019 Creatinine1.09, BUN7, Potassium 3.2, Sodium141, GFR59->60  11/10/2019 Creatinine0.91, BUN12, Potassium3.5, Sodium143, GFR>60 10/15/2019 Creatinine0.82, BUN18, Potassium4.1, Sodium140, GFR>60 08/18/2019 Creatinine1.01, BUN9, Potassium 4.3, Sodium142, GFR>60  08/14/2019 Creatinine1.03, BUN10, Potassium3.6, Sodium138, GFR>60  08/04/2019 Creatinine0.75, BUN16, Potassium3.7, Sodium140, GFR>60 A complete set of results can be found in Results Review.  Recommendations:None  Follow-up plan: ICM clinic phone appointment on5/27/2021. 91 day device clinic remote transmission 05/31/2020. Office visit with Dr Haroldine Laws on4/28/2021.  Copy of ICM check sent to Dr.Taylor.  3 month ICM trend: 04/16/2020        [name], RN 04/17/2020 1:00 PM

## 2020-04-18 ENCOUNTER — Telehealth: Payer: Self-pay | Admitting: Internal Medicine

## 2020-04-18 NOTE — Chronic Care Management (AMB) (Signed)
  Chronic Care Management   Note  04/18/2020 Name: Christine Mcdowell MRN: [id number] DOB: 10-20-1971  Christine Mcdowell is a 49 y.o. year old female who is a primary care patient of [name], MD and is actively engaged with the care management team. I reached out to [name] by phone today to assist with scheduling an initial visit with the Pharmacist referred by patients health plan.  Follow up plan: Telephone appointment with care management team member scheduled for: 04/20/2020  [facility]  [place], [place] [address] Direct Dial: [email or web address] Website: [email or web address]

## 2020-04-19 ENCOUNTER — Ambulatory Visit (HOSPITAL_COMMUNITY)
Admission: RE | Admit: 2020-04-19 | Discharge: 2020-04-19 | Disposition: A | Discharge status: Home / Self Care | Payer: Medicare Other | Source: Ambulatory Visit | Attending: Internal Medicine | Admitting: Internal Medicine

## 2020-04-19 ENCOUNTER — Inpatient Hospital Stay (HOSPITAL_COMMUNITY): Admission: RE | Admit: 2020-04-19 | Payer: Medicare Other | Source: Ambulatory Visit | Admitting: Internal Medicine

## 2020-04-19 ENCOUNTER — Other Ambulatory Visit: Payer: Self-pay

## 2020-04-19 ENCOUNTER — Ambulatory Visit (HOSPITAL_BASED_OUTPATIENT_CLINIC_OR_DEPARTMENT_OTHER)
Admission: RE | Admit: 2020-04-19 | Discharge: 2020-04-19 | Disposition: A | Discharge status: Home / Self Care | Payer: Medicare Other | Source: Ambulatory Visit | Attending: Internal Medicine | Admitting: Internal Medicine

## 2020-04-19 ENCOUNTER — Encounter (HOSPITAL_COMMUNITY): Payer: Self-pay | Admitting: Internal Medicine

## 2020-04-19 VITALS — BP 142/88 | HR 77 | Wt 189.4 lb

## 2020-04-19 DIAGNOSIS — E118 Type 2 diabetes mellitus with unspecified complications: Secondary | ICD-10-CM

## 2020-04-19 DIAGNOSIS — Z8673 Personal history of transient ischemic attack (TIA), and cerebral infarction without residual deficits: Secondary | ICD-10-CM | POA: Diagnosis not present

## 2020-04-19 DIAGNOSIS — J45909 Unspecified asthma, uncomplicated: Secondary | ICD-10-CM | POA: Insufficient documentation

## 2020-04-19 DIAGNOSIS — M797 Fibromyalgia: Secondary | ICD-10-CM | POA: Insufficient documentation

## 2020-04-19 DIAGNOSIS — I5022 Chronic systolic (congestive) heart failure: Secondary | ICD-10-CM | POA: Insufficient documentation

## 2020-04-19 DIAGNOSIS — Z95810 Presence of automatic (implantable) cardiac defibrillator: Secondary | ICD-10-CM | POA: Insufficient documentation

## 2020-04-19 DIAGNOSIS — Z79899 Other long term (current) drug therapy: Secondary | ICD-10-CM | POA: Insufficient documentation

## 2020-04-19 DIAGNOSIS — Z794 Long term (current) use of insulin: Secondary | ICD-10-CM | POA: Insufficient documentation

## 2020-04-19 DIAGNOSIS — Z8249 Family history of ischemic heart disease and other diseases of the circulatory system: Secondary | ICD-10-CM | POA: Diagnosis not present

## 2020-04-19 DIAGNOSIS — M329 Systemic lupus erythematosus, unspecified: Secondary | ICD-10-CM | POA: Diagnosis not present

## 2020-04-19 DIAGNOSIS — I110 Hypertensive heart disease with heart failure: Secondary | ICD-10-CM | POA: Insufficient documentation

## 2020-04-19 DIAGNOSIS — G4733 Obstructive sleep apnea (adult) (pediatric): Secondary | ICD-10-CM | POA: Insufficient documentation

## 2020-04-19 DIAGNOSIS — M069 Rheumatoid arthritis, unspecified: Secondary | ICD-10-CM | POA: Insufficient documentation

## 2020-04-19 DIAGNOSIS — I428 Other cardiomyopathies: Secondary | ICD-10-CM | POA: Insufficient documentation

## 2020-04-19 DIAGNOSIS — D573 Sickle-cell trait: Secondary | ICD-10-CM | POA: Insufficient documentation

## 2020-04-19 DIAGNOSIS — Z8616 Personal history of COVID-19: Secondary | ICD-10-CM | POA: Diagnosis not present

## 2020-04-19 DIAGNOSIS — Z7982 Long term (current) use of aspirin: Secondary | ICD-10-CM | POA: Insufficient documentation

## 2020-04-19 DIAGNOSIS — E119 Type 2 diabetes mellitus without complications: Secondary | ICD-10-CM | POA: Diagnosis not present

## 2020-04-19 DIAGNOSIS — Z791 Long term (current) use of non-steroidal anti-inflammatories (NSAID): Secondary | ICD-10-CM | POA: Diagnosis not present

## 2020-04-19 LAB — BASIC METABOLIC PANEL
Anion gap: 11 (ref 5–15)
BUN: 11 mg/dL (ref 6–20)
CO2: 25 mmol/L (ref 22–32)
Calcium: 9.1 mg/dL (ref 8.9–10.3)
Chloride: 109 mmol/L (ref 98–111)
Creatinine, Ser: 0.81 mg/dL (ref 0.44–1.00)
GFR calc Af Amer: >60 mL/min (ref >60)
GFR calc non Af Amer: >60 mL/min (ref >60)
Glucose, Bld: 93 mg/dL (ref 70–99)
Potassium: 3.2 mmol/L — ABNORMAL LOW (ref 3.5–5.1)
Sodium: 145 mmol/L (ref 135–145)

## 2020-04-19 LAB — BRAIN NATRIURETIC PEPTIDE: B Natriuretic Peptide: 71.3 pg/mL (ref 0.0–100.0)

## 2020-04-19 NOTE — Progress Notes (Signed)
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   Admitted in October 2017 with increased dyspnea and volume overload. Diuresed with IV lasix and transitioned to lasix 40 mg twice a day. LHC/RHC normal filling pressures, EF ~20%, and normal coronaries. Discharge weight 184 pounds.  Cardiac MRI in 02/2015 showed EF 46%. No LGE.  Echo 02/12/19, which showed EF 40-45%, RV normal.   CPX 9/19 with relaltively normal spirometry. Mild HF limitation.   Had R TKA 3/20   Had Covid-19 infection in March with COIVD PNA. Received infusion but not hospitalized. Seen by [facility].  She is here for f/u. Breathing has gotten much better. Some SOB with allergies. Mild LE edema. Recent lupus flare after being off prednisone for COVID. Now on prednisone taper at 20mg  daily. BP in am before meds 130s/80s. Weight down 4 pounds.    Past Medical History:  Diagnosis Date  . AICD (automatic cardioverter/defibrillator) present 01/28/2018  . Anemia   . Anginal pain ([place])   . Asthma   . Cervical cancer ([place])   . CHF (congestive heart failure) ([place])   . Diabetes mellitus without complication ([place])    steroid induced  . Discoid lupus   . Fibromyalgia   . History of blood transfusion "several"   "related to anemia; had some w/hysterectomy also"  . Hx of cardiovascular stress test    ETT-Myoview (9/15):  No ischemia, EF 52%;  NORMAL  . Hx of echocardiogram    Echo (9/15):  EF 50-55%, ant HK, Gr 1 DD, mild MR, mild LAE, no effusion  . Hypertension   . Iron deficiency anemia    h/o iron transfusions  . Lupus (systemic lupus erythematosus) ([place])   . Migraine    "a few/year" (07/03/2016)  . Pneumonia 12/2015  . RA (rheumatoid arthritis) ([place])    "all over" (07/03/2016)  . Sickle cell trait ([place])   . Stroke ([place]) 2014 X 1; 2015 X 2; 2016 X 1;    "right side of face more relaxed than the other; rare speech hesitation" (07/03/2016)  . Vaginal Pap smear, abnormal    ASCUS; HPV   Past Surgical History:  Procedure Laterality Date  . ABDOMINAL HYSTERECTOMY  2009  . ABDOMINAL WOUND DEHISCENCE  2009  . CARDIAC CATHETERIZATION N/A 10/11/2016   Procedure: Right/Left Heart Cath and Coronary Angiography;  Surgeon: Jolaine Artist, MD;  Location: [facility];  Service: Cardiovascular;  Laterality: N/A;  . DILATION AND CURETTAGE OF UTERUS  1991  . HEMATOMA EVACUATION  2009   abdomen  . ICD IMPLANT  01/28/2018  . ICD IMPLANT N/A 01/28/2018   Procedure: ICD IMPLANT;  Surgeon: Evans Lance, MD;  Location: [facility];  Service: Cardiovascular;  Laterality: N/A;  . INCISE AND DRAIN ABCESS  2009 X 2   "abdomen after hysterectomy"  . KNEE ARTHROSCOPY Right 1997  . KNEE SURGERY Right   . TUBAL LIGATION  1996     Current  Outpatient Medications  Medication Sig Dispense Refill  . albuterol (ACCUNEB) 1.25 MG/3ML nebulizer solution Take 3 mLs (1.25 mg total) by nebulization every 6 (six) hours as needed for wheezing. 75 mL 12  . aspirin 81 MG EC tablet Take by mouth daily. Taking 2 tablets    . atorvastatin (LIPITOR) 10 MG tablet TAKE 1 TABLET(10 MG) BY MOUTH DAILY 90 tablet 0  . Azelastine HCl 137 MCG/SPRAY SOLN Place 2 sprays into the nose daily. 30 mL 3  . Bacillus Coagulans-Inulin (PROBIOTIC) 1-250 BILLION-MG CAPS Take 1 capsule by mouth daily. 30 capsule 2  . carvedilol (COREG) 25 MG tablet TAKE 1  TABLET(25 MG) BY MOUTH TWICE DAILY WITH A MEAL 180 tablet 3  . Continuous Blood Gluc Receiver (DEXCOM G6 RECEIVER) DEVI 1 Device by Does not apply route 3 (three) times daily before meals. 1 each 1  . Continuous Blood Gluc Sensor (DEXCOM G6 SENSOR) MISC Inject 1 each into the skin 3 (three) times daily. 3 each 1  . Continuous Blood Gluc Transmit (DEXCOM G6 TRANSMITTER) MISC 1 Device by Does not apply route 3 (three) times daily before meals. 1 each 1  . folic acid (FOLVITE) 1 MG tablet TAKE 1 TABLET BY MOUTH EVERY MORNING (Patient taking differently: Take 1 mg by mouth daily. ) 30 tablet 0  . hydrALAZINE (APRESOLINE) 100 MG tablet Take 1 tablet (100 mg total) by mouth 3 (three) times daily. 90 tablet 6  . hydroxychloroquine (PLAQUENIL) 200 MG tablet Take 1 tablet (200 mg total) by mouth 2 (two) times daily. 180 tablet 0  . ipratropium (ATROVENT) 0.03 % nasal spray USE 2 SPRAYS IN EACH NOSTRIL THREE TIMES DAILY AS NEEDED FOR RHINITIS 30 mL 2  . Magnesium 200 MG TABS Take 1 tablet by mouth daily.    . meloxicam (MOBIC) 15 MG tablet Take 15 mg by mouth daily.     . metFORMIN (GLUCOPHAGE) 500 MG tablet TAKE 1 TABLET BY MOUTH EVERY DAY WITH THE MORNING AND EVENING MEAL 180 tablet 1  . methotrexate (RHEUMATREX) 2.5 MG tablet Take 20 mg by mouth every Friday.   3  . metolazone (ZAROXOLYN) 2.5 MG tablet TAKE 1 TABLET BY MOUTH AS NEEDED FOR WEIGHT GAIN OF 5 POUNDS WITHIN 3 DAYS AS DIRECTED 5 tablet 3  . montelukast (SINGULAIR) 10 MG tablet TAKE 1 TABLET BY MOUTH DAILY 90 tablet 1  . Multiple Vitamin (MULTIVITAMIN WITH MINERALS) TABS Take 1 tablet by mouth every morning.     Marland Kitchen NOVOLOG FLEXPEN 100 UNIT/ML FlexPen INJECT UP TO 17 UNITS THREE TIMES DAILY BY SLIDING SCALE AS DIRECTED 5 pen 2  . ondansetron (ZOFRAN) 4 MG tablet Take 1 tablet (4 mg total) by mouth every 8 (eight) hours as needed for nausea or vomiting. 20 tablet 0  . potassium chloride SA (KLOR-CON) 20 MEQ tablet Take 1 tablet (20 mEq total) by  mouth daily. 90 tablet 3  . PROAIR HFA 108 (90 Base) MCG/ACT inhaler INHALE 2 PUFFS BY MOUTH EVERY DAY AS NEEDED FOR SHORTNESS OF BREATH 18 g 2  . sacubitril-valsartan (ENTRESTO) 97-103 MG Take 1 tablet by mouth 2 (two) times daily. 60 tablet 6  . spironolactone (ALDACTONE) 25 MG tablet TAKE 1 TABLET(25 MG) BY MOUTH TWICE DAILY 180 tablet 3  . torsemide (DEMADEX) 20 MG tablet Take 1 tablet Monday, Wednesday , and Friday 15 tablet 6  . traMADol (ULTRAM) 50 MG tablet Take 1 tablet (50 mg total) by mouth every 6 (six) hours as needed. [place]  tablet 0  . traZODone (DESYREL) 50 MG tablet Take 50 mg by mouth at bedtime.    . Vitamin D, Ergocalciferol, (DRISDOL) 1.25 MG (50000 UT) CAPS capsule TAKE 1 CAPSULE BY MOUTH 2 TIMES EVERY WEEK 24 capsule 0   No current facility-administered medications for this encounter.    Allergies:   Hydromorphone, Iodinated diagnostic agents, Other, Erythromycin, Latex, Tape, and Mircette [desogestrel-ethinyl estradiol]   Social History:  The patient  reports that she has never smoked. She has never used smokeless tobacco. She reports that she does not drink alcohol or use drugs.   Family History:  The patient's family history includes Allergies in her father and mother; Arthritis in her mother; Asthma in her maternal grandmother; Cancer in her paternal grandfather; Cushing syndrome in her father; Dementia in her paternal grandmother; Depression in her father; Diabetes in her maternal grandmother; Drug abuse in her mother; Heart attack in her father and maternal grandfather; Heart murmur in her mother; Hypertension in her maternal grandmother.   ROS:  Please see the history of present illness.   All other systems are personally reviewed and negative.   Vitals:   04/19/20 1402  BP: (!) 142/88  Pulse: 77  SpO2: 97%    Exam:   General:  Well appearing. No resp difficulty HEENT: normal Neck: supple. JVP 6. Carotids 2+ bilat; no bruits. No lymphadenopathy or thryomegaly  appreciated. Cor: PMI nondisplaced. Regular rate & rhythm. No rubs, gallops or murmurs. Lungs: clear Abdomen: soft, nontender, nondistended. No hepatosplenomegaly. No bruits or masses. Good bowel sounds. Extremities: no cyanosis, clubbing, rash, trace edema Neuro: alert & orientedx3, cranial nerves grossly intact. moves all 4 extremities w/o difficulty. Affect pleasant  Recent Labs: 12/02/2019: ALT 12 01/31/2020: B Natriuretic Peptide 40.3; BUN 13; Creatinine, Ser 0.88; Hemoglobin 12.0; Platelets 277; Potassium 3.4; Sodium 141 04/12/2020: TSH 0.652  Personally reviewed   Wt Readings from Last 3 Encounters:  04/19/20 85.9 kg (189 lb 6.4 oz)  04/12/20 86.7 kg (191 lb 3.2 oz)  03/07/20 86.2 kg (190 lb)      ASSESSMENT AND PLAN:  1. Chronic Systolic Heart Failure - NICM Cath 10/11/16 with normal cors. CPX 10/2016: Peak VO2: 18.5 (82% predicted peak VO2), VE/VCO2 slope: 30. ECHO 12/18 EF 25-30%  - S/P [employer] HeartLogic ICD 01/2018.  - Cardiac MRI in 02/2015 showed EF 46%. No LGE. - Repeat CPX 9/19 with mild HR limitation - Echo 02/12/19: EF 40-45% -Overall stable NYHA II-early III - Volume status upper end of normal. Continue torsemide 20 MWF. Can take extra torsemide or metolazone prn as needed - Continue spiro 25 mg qHS.  - Continue coreg 25 mg BID -ContinueEntresto 97/103 mg BID  - Off Farxiga 10 due to recurrent yeast infections - Continue hydralazine 100 mg TID. Not on imdur due to headaches.  - She has been consented for cardiomems, but will plan to monitor her with HeartLogic device for now unless she has frequent hospitalizations for CHF - Will see in clinic in 4 weeks with repeat echo  2. Sinus tach vs atrial flutter on ICD - Still with palpitations but no VT on ICD - Zio patch 9/20 Rare PVCs and PAcs - Stable 3. HTN -> Hypotension -Blood pressure well controlled. Continue current regimen. 4. OSA - Not wearing CPAP. OSA improved with weight loss.  5. DM2  -  Off Farxiga 10 due to recurrent yeast infections   Signed, Glori Bickers, MD  04/19/2020 2:25 PM  [facility] [place] [facility] [place] [address] [phone] (office) [phone] (fax)

## 2020-04-19 NOTE — Addendum Note (Signed)
Encounter addended by: Scarlette Calico, RN on: 04/19/2020 2:42 PM  Actions taken: Order list changed, Diagnosis association updated, Clinical Note Signed, Charge Capture section accepted

## 2020-04-19 NOTE — Progress Notes (Signed)
*  PRELIMINARY RESULTS* Echocardiogram 2D Echocardiogram has been performed.  Christine Mcdowell 04/19/2020, 1:27 PM

## 2020-04-19 NOTE — Patient Instructions (Signed)
Labs done today, your results will be available in MyChart, we will contact you for abnormal readings.  Your physician recommends that you schedule a follow-up appointment in: 4 months with echocardiogram  If you have any questions or concerns before your next appointment please send [place] a message through [place] or call our office at [phone].  At the [facility], you and your health needs are our priority. As part of our continuing mission to provide you with exceptional heart care, we have created designated Provider Care Teams. These Care Teams include your primary Cardiologist (physician) and Advanced Practice Providers (APPs- Physician Assistants and Nurse Practitioners) who all work together to provide you with the care you need, when you need it.   You may see any of the following providers on your designated Care Team at your next follow up: Marland Kitchen Dr Glori Bickers . Dr Loralie Champagne . Darrick Grinder, NP . Lyda Jester, PA . Audry Riles, PharmD   Please be sure to bring in all your medications bottles to every appointment.

## 2020-04-20 ENCOUNTER — Other Ambulatory Visit: Payer: Self-pay

## 2020-04-20 ENCOUNTER — Ambulatory Visit: Payer: Medicare Other

## 2020-04-20 DIAGNOSIS — E785 Hyperlipidemia, unspecified: Secondary | ICD-10-CM

## 2020-04-20 DIAGNOSIS — E1169 Type 2 diabetes mellitus with other specified complication: Secondary | ICD-10-CM

## 2020-04-20 DIAGNOSIS — I5022 Chronic systolic (congestive) heart failure: Secondary | ICD-10-CM

## 2020-04-20 DIAGNOSIS — I10 Essential (primary) hypertension: Secondary | ICD-10-CM

## 2020-04-20 MED ORDER — BLOOD GLUCOSE MONITOR KIT: PACK | 0 refills | Status: DC

## 2020-04-20 NOTE — Chronic Care Management (AMB) (Signed)
Chronic Care Management Pharmacy  Name: Christine Mcdowell  MRN: [id number] DOB: [birth year]  Chief Complaint/ HPI  Christine Mcdowell,  49 y.o. , female presents for their Initial CCM visit with the clinical pharmacist via telephone due to COVID-19 Pandemic.  PCP : [name], MD, [name], FNP  Their chronic conditions include: Hypertension, Chronic Systolic Congestive Heart Failure, Diabetes, Asthma, Allergies, Systemic Lupus Erythematosus  Office Visits: 04/12/20- OV w/ [name]-Presented post COVID pneumonia complaining of back pain and cold sweats; back pain is a recurrent problem starting > 1 month ago. Referred to pulmonology for breathing issues. Advised to wait until June for COVID vaccine due to having antibody infusion on 03/08/20. Labs ordered (TSH, FH, LSH, VitD). Referred to physical therapy for back pain.   03/07/20- Telemed visit regarding COVID infection (tested positive 3/14). Cough treated with Hydromet, amoxicillin (due to frequent pneumonia episodes), albuterol nebs and HFA  11/24/19- Telemed visit- Presented with cough (history of asthma) could consider LABA, uncontrolled diabetes (prednisone taper for 8 weeks). Follow up in 3 months  Consult Visits: 04/19/20- Cardiology follow up w/ Dr. Haroldine Laws- Breathing has improved, moderate LE edema, Echo performed (LVEF 45-50%); will follow up and repeat echo in 4 weeks. Continue current meds, take an additional torsemide or metolazone as needed.  04/10/20- Podiatry visit with Dr. Rock Nephew for evaluation of left bunion (acquired hallux valgus) causing pain with walking ad wearing shoes. Xray performed. Appt scheduled to surgically correct. 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 Hematology recommends pre-op cryoprecipitate and monitoring after surgery.  02/25/20- Post-op visit with Ophthalmology- Need to reschedule Plaquenil eye exam  02/23/20- Ortho post-op follow up with Hedy Jacob- Total right knee  arthoplasty 1 year ago. Patient reports intermittent episodes of swelling typically after standing for 45-60 minutes. Labs ordered. Follow up in 1 year. Knee-high compression stockings only made the problem worse according to patient. Encourage ambulation and ROM exercises. Swelling possibly due to RA.  02/16/20- Actemra infusion by Rheumatology. Denied any symptoms post infusion, no chest pain, no SOB (02/17/20)  02/07/20-Rheumatology Televisit- Continue plaquenil 224m BID (need yearly eye exam), MTX 266mweekly (check toxicity every 3 months), folic acid daily, and prednisone 2070maily. Start Actemra infusion. Consider tapering down prednisone a few weeks after starting infusion. Continue bisphosphonate, encourage calcium and Vitamin D.   02/01/20- Televisit- Increase hydralazine to 100m61mD and start Potassium 20mE1mily per cardiology  01/31/20- ED visit- Chest pain/palpitations, ECG no acute changes  12/06/19- Telephone encounter-  Fluconazole 200mg 65my for 2 days prescribed by Dr. BensimHaroldine Lawso yeast infection from [place]- ED visit- Presented for right upper and right lower abdominal chest pain. Pericardial effusion and cardiac tamponade present upon CT scan.   11/10/19- Cardiology OV- Hydralazine increased to 75mg t33m times daily, add Farxiga 10mg da74m CCM Encounters: 04/10/20- TV w/ RN- Encouraged self management of CHF, [facility] of [facility], difficulty getting Dexcom supplies from pharmacy ([facility] to [facility]).   03/13/20- TV w/ Dr. Pruitt- Blanca Friendhensive medication review performed, contacted insurance regarding Dexcom possibly needing PA  01/03/20- TV w/ Dr. Pruitt- Blanca Friendt education provided; pt received Dexcom delivery on 12/13/20, encourage pt to call back for refills when needed  12/13/19- TV w/ Dr. Pruitt-CWaldon Reiningion review performed; [place] [facility]- TV w/ Dr. Pruitt- Blanca Friendt [facility] as needed  11/23/19-  TV w/ Dr. Blanca Friend- Comprehensive medication review performed, [facility] denied Dexcom DME billing, will apply through [place] [facility]  11/22/19- TV w/ Dr. Blanca Friend- Comprehensive medication review performed, Lost prescription override for Dexcom scanner divide denied by [employer] (only 1 device per 365 days). Will attempt to bill under [place] [facility] complete plan at [facility]  Medications: Outpatient Encounter Medications as of 04/20/2020  Medication Sig  . albuterol (ACCUNEB) 1.25 MG/3ML nebulizer solution Take 3 mLs (1.25 mg total) by nebulization every 6 (six) hours as needed for wheezing.  Marland Kitchen aspirin 81 MG chewable tablet Chew 162 mg by mouth daily. Taking 2 tablets  . atorvastatin (LIPITOR) 10 MG tablet TAKE 1 TABLET(10 MG) BY MOUTH DAILY  . Azelastine HCl 137 MCG/SPRAY SOLN Place 2 sprays into the nose daily.  . Bacillus Coagulans-Inulin (PROBIOTIC) 1-250 BILLION-MG CAPS Take 1 capsule by mouth daily.  . blood glucose meter kit and supplies KIT Dispense based on patient and insurance preference. Use to check blood sugars daily E11.9  . carvedilol (COREG) 25 MG tablet TAKE 1 TABLET(25 MG) BY MOUTH TWICE DAILY WITH A MEAL  . Continuous Blood Gluc Receiver (DEXCOM G6 RECEIVER) DEVI 1 Device by Does not apply route 3 (three) times daily before meals.  . Continuous Blood Gluc Sensor (DEXCOM G6 SENSOR) MISC Inject 1 each into the skin 3 (three) times daily.  . Continuous Blood Gluc Transmit (DEXCOM G6 TRANSMITTER) MISC 1 Device by Does not apply route 3 (three) times daily before meals.  . diclofenac Sodium (VOLTAREN) 1 % GEL APPLY SMALL AMOUNT TO INVOLVED JOINTS UP TO TWICE DAILY  . folic acid (FOLVITE) 1 MG tablet TAKE 1 TABLET BY MOUTH EVERY MORNING  . hydrALAZINE (APRESOLINE) 100 MG tablet Take 1 tablet (100 mg total) by mouth 3 (three) times daily.  .  hydroxychloroquine (PLAQUENIL) 200 MG tablet Take 1 tablet (200 mg total) by mouth 2 (two) times daily.  Marland Kitchen ipratropium (ATROVENT) 0.03 % nasal spray USE 2 SPRAYS IN EACH NOSTRIL THREE TIMES DAILY AS NEEDED FOR RHINITIS  . Magnesium 400 MG TABS Take 1 tablet by mouth daily.   . meloxicam (MOBIC) 15 MG tablet Take 15 mg by mouth daily.   . metFORMIN (GLUCOPHAGE) 500 MG tablet TAKE 1 TABLET BY MOUTH EVERY DAY WITH THE MORNING AND EVENING MEAL  . methotrexate (RHEUMATREX) 2.5 MG tablet Take 20 mg by mouth every Friday.   . metolazone (ZAROXOLYN) 2.5 MG tablet TAKE 1 TABLET BY MOUTH AS NEEDED FOR WEIGHT GAIN OF 5 POUNDS WITHIN 3 DAYS AS DIRECTED  . montelukast (SINGULAIR) 10 MG tablet TAKE 1 TABLET BY MOUTH DAILY  . Multiple Vitamin (MULTIVITAMIN WITH MINERALS) TABS Take 1 tablet by mouth every morning.   Marland Kitchen NOVOLOG FLEXPEN 100 UNIT/ML FlexPen INJECT UP TO 17 UNITS THREE TIMES DAILY BY SLIDING SCALE AS DIRECTED  . ondansetron (ZOFRAN) 4 MG tablet Take 1 tablet (4 mg total) by mouth every 8 (eight) hours as needed for nausea or vomiting.  . potassium chloride SA (KLOR-CON) 20 MEQ tablet Take 1 tablet (20 mEq total) by mouth daily.  . predniSONE (DELTASONE) 10 MG tablet Take 2 tabs daily x 5 days, then 1.5 tabs daily x 5 days, then 1 tab daily x 5 days, then 0.5 tabs daily x 5 days  . PROAIR HFA 108 (90 Base) MCG/ACT inhaler INHALE 2 PUFFS BY MOUTH EVERY DAY AS NEEDED FOR SHORTNESS OF BREATH  . sacubitril-valsartan (ENTRESTO) 97-103 MG Take 1 tablet by mouth 2 (two) times  daily.  . spironolactone (ALDACTONE) 25 MG tablet TAKE 1 TABLET(25 MG) BY MOUTH TWICE DAILY  . torsemide (DEMADEX) 20 MG tablet Take 1 tablet Monday, Wednesday , and Friday  . traZODone (DESYREL) 50 MG tablet Take 50 mg by mouth at bedtime.  Tyler Aas FLEXTOUCH 100 UNIT/ML FlexTouch Pen Inject 14 Units into the skin daily.   . Vitamin D, Ergocalciferol, (DRISDOL) 1.25 MG (50000 UT) CAPS capsule TAKE 1 CAPSULE BY MOUTH 2 TIMES EVERY WEEK    . traMADol (ULTRAM) 50 MG tablet Take 1 tablet (50 mg total) by mouth every 6 (six) hours as needed. (Patient not taking: Reported on 05/01/2020)   No facility-administered encounter medications on file as of 04/20/2020.   Current Diagnosis/Assessment:  SDOH Interventions     Most Recent Value  SDOH Interventions  SDOH Interventions for the Following Domains  Physical Activity  Physical Activity Interventions  Other (Comments) [Patient agreed to increase physical activity to 15 minutes 5 days per week]      Goals Addressed            This Visit's Progress   . Care Plan       CARE PLAN ENTRY  Current Barriers:  . Chronic Disease Management support, education, and care coordination needs related to Hypertension, Hyperlipidemia, Diabetes, and Heart Failure   Hypertension . Pharmacist Clinical Goal(s): o Over the next 4 weeks, patient will work with PharmD and providers to maintain BP goal <130/80 . Current regimen:  o Carvedilol 82m twice daily with a meal o Spironolactone 24mtwice daily o Hydralazine 10071mhree times daily . Interventions: o Recommended increase in physical activity  o Recommended heart healthy diet . Patient self care activities - Over the next 4 weeks, patient will: o Check BP daily, document, and provide at future appointments o Ensure daily salt intake < 2300 mg/day o Exercise 15 minutes daily 5 times per week  Hyperlipidemia . Pharmacist Clinical Goal(s): o Over the next 4 weeks, patient will work with PharmD and providers to maintain LDL goal < 100 . Current regimen:  o Atorvastatin 48m43mily . Interventions: o Recommend increase in physical activity . Patient self care activities - Over the next 4 weeks, patient will: o Exercise 15 minutes daily 5 times per week o Limit fried foods and red meats  Diabetes . Pharmacist Clinical Goal(s): o Over the next 4 weeks, patient will work with PharmD and providers to maintain A1c goal  <7% . Current regimen:  o Metformin 500mg32mh morning and evening meal o Novolog Flexpen up to 17 units three times daily as directed per sliding scale o Tresiba 14 units daily . Interventions: o Onetouch diabetic testing supplies delivered through local pharmacy until more Dexcom supplies can be obtained o [facility] supplies o Recommended increase in physical activity o Recommended diabetic diet . Patient self care activities - Over the next 4 weeks, patient will: o Check blood sugar 3-4 times daily, document, and provide at future appointments o Contact provider with any episodes of hypoglycemia o Exercise 15 minutes daily 5 times per week  Heart Failure . Pharmacist Clinical Goal(s) o Over the next 4 weeks, patient will work with PharmD and providers to prevent weight gain and fluid accumulation . Current regimen:  o Carvedilol 25mg 62me daily with a meal o Spironolactone 25mg t71m daily o Hydralazine 100mg th82mtimes daily o Torsemide 20mg on 53may, Wednesday, and Friday o Entresto 97-103mg twic51m  daily o Metolazone 2.11m as needed for weight gain of 5lbs within 3 days as directed . Interventions o Recommended to assess fluid status daily . Patient self care activities - Over the next 4 weeks, patient will: o Weigh daily, record, and provide at future appointments  Medication management . Pharmacist Clinical Goal(s): o Over the next 4 weeks, patient will work with PharmD and providers to maintain optimal medication adherence . Current pharmacy: [facility] . Interventions o Comprehensive medication review performed. o Utilize [facility] for medication synchronization, packaging and delivery . Patient self care activities - Over the next 4 weeks, patient will: o Focus on medication adherence by transitioning to adherence packaging and medication synchronization o Take medications as prescribed o Report  any questions or concerns to PharmD and/or provider(s)  Initial goal documentation        Asthma   Eosinophil count:   Lab Results  Component Value Date/Time   EOSPCT 0 11/22/2019 02:12 AM   EOSPCT 1.2 06/03/2007 11:14 AM  %                               Eos (Absolute):  Lab Results  Component Value Date/Time   EOSABS 0.1 12/02/2019 05:58 PM   Tobacco Status:  Social History   Tobacco Use  Smoking Status Never Smoker  Smokeless Tobacco Never Used   Patient has failed these meds in past: Breo Patient is currently uncontrolled on the following medications:  -Albuterol 1.25/369mnebulizer solution every 6 hours as needed -Montelukast 1020maily -Proair 2 puffs daily as needed for shortness of breath  Using maintenance inhaler regularly? No Frequency of rescue inhaler use:  3-4x per week  We discussed:  -Pt reports asthma is okay if allergies are not bad, but she has been struggling since COVID infection -Asthma symptoms are worse when outside and with exercise, but relieved with albuterol HFA. SOB with short walks resolves on its own -Symptoms occur 3 to 4 times a week, not daily anymore -Pt reports trying many inhalers and that they did not help -Only uses albuterol nebs in the afternoon if need -Only uses Duoneb when she is instructed to by PCP   Plan -Continue current medications  Diabetes   Recent Relevant Labs: Lab Results  Component Value Date/Time   HGBA1C 6.3 (H) 12/02/2019 06:08 PM   HGBA1C 6.4 (H) 06/18/2019 12:51 PM   MICROALBUR 10 08/24/2019 06:06 PM    Dexcom G6 BG monitor  Checking BG: More than 3 times daily when using Dexcom CGM; has not been able to check lately due to running out of Dexcom supplies  Recent FBG Readings: 75-91 Recent 2hr PP BG readings:  119-150 Recent middle of the night readings: 200-300 Patient has failed these meds in past: Januvia, Tresiba, Onglyza, Levemir, Invokana, Farxiga Patient is currently controlled on the  following medications:  -Metformin 500m43mth morning and evening meal -Novolog Flexpen up to 17 units three times daily as directed per sliding scale -Tresiba 14 units daily  Last diabetic Foot exam: Unknown Last diabetic Eye exam: Current per OV notes, no date available  We discussed: -Diet and exercise extensively  Pt tries to follow a diabetic diet and eat consistent meals  Pt currently reports exercising 15 minutes 2-3 times per week  (mobility exercise through remote health, resistance bands,  walking 1/4 mile)  Pt drinks mostly water with some sweet tea and cranberry juice;  no soda; Recommended  pt check the sugar content on the  cranberry juice  Uses Stevia sweetener  Goal is 30 minutes 5 times weekly, pt will work to increase to 15  minutes 5 times weekly -Pt reported Hgb A1c of 6.9% [facility] at [facility] last week  Plan -Continue current medications  -Coordinate Dexcom supplies order  Hyperlipidemia   Lipid Panel     Component Value Date/Time   CHOL 162 12/02/2019 1808   TRIG 122 12/02/2019 1808   HDL 52 12/02/2019 1808   CHOLHDL 3.1 12/02/2019 1808   LDLCALC 88 12/02/2019 1808   LABVLDL 22 12/02/2019 1808     The 10-year ASCVD risk score Mikey Bussing DC Jr., et al., 2013) is: 11.4%   Values used to calculate the score:     Age: 62 years     Sex: Female     Is Non-Hispanic African American: Yes     Diabetic: Yes     Tobacco smoker: No     Systolic Blood Pressure: 315 mmHg     Is BP treated: Yes     HDL Cholesterol: 52 mg/dL     Total Cholesterol: 162 mg/dL   Patient has failed these meds in past: N/A Patient is currently controlled on the following medications:  -Atorvastatin 269m daily  -Aspirin 81 mg 2 tablets daily  We discussed: -Diet and exercise extensively  Pt reports a heart healthy diet using No salt and eating a lot of  chicken  Pt minimizes fried food Plan -Continue current medications  Heart Failure   Type: Systolic  Last ejection fraction:  LVEF 45-50% on 04/19/20, 40-45% on 02/12/19  Potassium 3.2 on 04/19/20  Patient has failed these meds in past: Isosorbide mononitrate, Bidil Patient is currently controlled on the following medications:  -Hydralazine 1094mthree times daily -Metolazone 2.69m60ms needed for weight gain of 5lbs within 3 days as directed -Potassium SA 33m769maily -Entresto 97-103mg66mce daily -Carvedilol 269mg 31me daily with a meal -Spironolactone 269mg t84m daily -Torsemide 33mg on369mday, Wednesday, and Friday  We discussed: -weighing daily; if you gain more than 3 pounds in one day or 5 pounds in one week call your doctor  -Pt reports weighing every morning and has notice a little fluid accumulation since taking prednisone  Plan -Continue current medications ,  Hypertension   Office blood pressures are  BP Readings from Last 3 Encounters:  04/19/20 (!) 142/88  04/12/20 118/74  03/08/20 119/69   Patient has failed these meds in the past: Valsartan, telmisartan, losartan, lisinopril, lisinopril/HCTZ Patient is currently usually controlled on the following medications:  -See Heart Failure  Patient checks BP at home daily  Patient home BP readings are ranging: 120/980-137/91  We discussed: -Diet and exercise extensively -Checking BP when feeling symptomatic   Plan -Continue current medications    Systemic Lupus Erythematosus and Rheumatoid Arthritis   Patient is currently on the following medications:  -Hydroxychloroquine 200mg twi73maily -Methotrexate 2.69mg 8 tab62ms (33mg) on F94VOys -Folic acid 1mg daily 16mtemra infusions -Meloxicam 169mg daily 56meeded -Diclofenac 1% gel as needed  We discussed:    instructed patient to use sparingly -Denies any abnormal bruising, bleeding from nose or gums or blood in urine or stool. -Ondansetron 4mg every 8 10mrs as needed for nausea and vomiting  Plan -Continue current medications as prescribed by Rheumatologist   Vitamin D Deficiency     Vitamin D 04/12/20: 43.2ng/mL  Patient is currently controlled on the following medications:  -Ergocalciferol 50,000 units twice  weekly  Plan -Continue current medications   Sleep   Patient is currently controlled on the following medications: -Trazodone 71m at bedtime  Plan -Continue current medications  Allergies   Patient is currently controlled on the following medications:  -Azelastine 1335m 2 sprays into the nose daily -Ipratropium 0.03% 2 sprays in each nostril three times daily as needed  Plan -Continue current medications   Over the Counter Medications   Patient is currently on the following medications: -Probiotic daily -Magnesium 40052maily -Multivitamin daily  Plan -Continue current medications  Vaccines   Immunization History  Administered Date(s) Administered  . Influenza, High Dose Seasonal PF 09/02/2018  . Influenza, Seasonal, Injecte, Preservative Fre 11/06/2017  . Influenza,inj,Quad PF,6+ Mos 11/06/2017, 08/24/2019  . Influenza-Unspecified 08/08/2017  . Tdap 08/24/2019   Plan -Will review vaccine history and discuss at follow up  Medication Management   [facility]  We discussed: -Benefits of medication synchronization and adherence packaging  Plan -Utilize [facility] for medication synchronization, packaging and delivery  Verbal consent obtained for [facility] enhanced pharmacy services (medication synchronization, adherence packaging, delivery coordination). A medication sync plan was created to allow patient to get all medications delivered once every 30 to 90 days per patient preference. Patient understands they have freedom to choose pharmacy and clinical pharmacist will coordinate care between all prescribers and [facility].  Follow up: 4 weeks phone visit  CouJannette FogoharmD Clinical Pharmacist [facility] [phone]

## 2020-04-21 ENCOUNTER — Telehealth (HOSPITAL_COMMUNITY): Payer: Self-pay | Admitting: *Deleted

## 2020-04-21 NOTE — Telephone Encounter (Signed)
Christine Mcdowell, [place]  04/21/2020 2:25 PM EDT    Pt aware.    Jolaine Artist, Christine Mcdowell  04/20/2020 1:38 PM EDT    20 daily   Christine Mcdowell, [place]  04/20/2020 10:28 AM EDT    Spoke with pt she said she was taken off Potassium when she had covid. Pt instructed to take 60 meq today lab appt scheduled. I told pt I would follow up with Dr.Bensimhon to see if he wanted pt to take 38meq daily or 40 meq daily since she has not been taking K since 3/16.    Bensimhon, Christine Pascal, Christine Mcdowell  04/20/2020 8:55 AM EDT    Is she taking her kcl? Please have her take 60 extra today and double regular dose to 40 daily. Repeat 1 week.

## 2020-04-21 NOTE — Telephone Encounter (Signed)
-----   Message from Jolaine Artist, MD sent at 04/20/2020  8:55 AM EDT ----- Is she taking her kcl? Please have her take 60 extra today and double regular dose to 40 daily. Repeat 1 week.

## 2020-04-27 ENCOUNTER — Other Ambulatory Visit (HOSPITAL_COMMUNITY): Payer: Medicare Other

## 2020-04-27 ENCOUNTER — Other Ambulatory Visit (HOSPITAL_COMMUNITY): Payer: Self-pay

## 2020-04-27 DIAGNOSIS — I5022 Chronic systolic (congestive) heart failure: Secondary | ICD-10-CM

## 2020-05-01 ENCOUNTER — Ambulatory Visit (HOSPITAL_COMMUNITY)
Admission: RE | Admit: 2020-05-01 | Discharge: 2020-05-01 | Disposition: A | Discharge status: Home / Self Care | Payer: Medicare Other | Source: Ambulatory Visit | Attending: Cardiology | Admitting: Cardiology

## 2020-05-01 ENCOUNTER — Other Ambulatory Visit: Payer: Self-pay

## 2020-05-01 DIAGNOSIS — D682 Hereditary deficiency of other clotting factors: Secondary | ICD-10-CM | POA: Insufficient documentation

## 2020-05-01 DIAGNOSIS — I5022 Chronic systolic (congestive) heart failure: Secondary | ICD-10-CM

## 2020-05-01 DIAGNOSIS — D688 Other specified coagulation defects: Secondary | ICD-10-CM | POA: Diagnosis not present

## 2020-05-01 LAB — BASIC METABOLIC PANEL
Anion gap: 9 (ref 5–15)
BUN: 14 mg/dL (ref 6–20)
CO2: 27 mmol/L (ref 22–32)
Calcium: 9 mg/dL (ref 8.9–10.3)
Chloride: 107 mmol/L (ref 98–111)
Creatinine, Ser: 0.81 mg/dL (ref 0.44–1.00)
GFR calc Af Amer: >60 mL/min (ref >60)
GFR calc non Af Amer: >60 mL/min (ref >60)
Glucose, Bld: 117 mg/dL — ABNORMAL HIGH (ref 70–99)
Potassium: 3.3 mmol/L — ABNORMAL LOW (ref 3.5–5.1)
Sodium: 143 mmol/L (ref 135–145)

## 2020-05-01 NOTE — Patient Instructions (Addendum)
Visit Information  Goals Addressed            This Visit's Progress   . Care Plan       CARE PLAN ENTRY  Current Barriers:  . Chronic Disease Management support, education, and care coordination needs related to Hypertension, Hyperlipidemia, Diabetes, and Heart Failure   Hypertension . Pharmacist Clinical Goal(s): o Over the next 4 weeks, patient will work with PharmD and providers to maintain BP goal <130/80 . Current regimen:  o Carvedilol '25mg'$  twice daily with a meal o Spironolactone '25mg'$  twice daily o Hydralazine '100mg'$  three times daily . Interventions: o Recommended increase in physical activity  o Recommended heart healthy diet . Patient self care activities - Over the next 4 weeks, patient will: o Check BP daily, document, and provide at future appointments o Ensure daily salt intake < 2300 mg/day o Exercise 15 minutes daily 5 times per week  Hyperlipidemia . Pharmacist Clinical Goal(s): o Over the next 4 weeks, patient will work with PharmD and providers to maintain LDL goal < 100 . Current regimen:  o Atorvastatin '10mg'$  daily . Interventions: o Recommend increase in physical activity . Patient self care activities - Over the next 4 weeks, patient will: o Exercise 15 minutes daily 5 times per week o Limit fried foods and red meats  Diabetes . Pharmacist Clinical Goal(s): o Over the next 4 weeks, patient will work with PharmD and providers to maintain A1c goal <7% . Current regimen:  o Metformin '500mg'$  with morning and evening meal o Novolog Flexpen up to 17 units three times daily as directed per sliding scale o Tresiba 14 units daily . Interventions: o Onet[employer] diabetic testing supplies delivered through local pharmacy until more Dexcom supplies can be obtained o Coordination with [facility] for delivery of more Dexcom testing supplies o Recommended increase in physical activity o Recommended diabetic diet . Patient self care activities - Over the next  4 weeks, patient will: o Check blood sugar 3-4 times daily, document, and provide at future appointments o Contact provider with any episodes of hypoglycemia o Exercise 15 minutes daily 5 times per week  Heart Failure . Pharmacist Clinical Goal(s) o Over the next 4 weeks, patient will work with PharmD and providers to prevent weight gain and fluid accumulation . Current regimen:  o Carvedilol '25mg'$  twice daily with a meal o Spironolactone '25mg'$  twice daily o Hydralazine '100mg'$  three times daily o Torsemide '20mg'$  on Monday, Wednesday, and Friday o Entresto 97-'103mg'$  twice daily o Metolazone 2.'5mg'$  as needed for weight gain of 5lbs within 3 days as directed . Interventions o Recommended to assess fluid status daily . Patient self care activities - Over the next 4 weeks, patient will: o Weigh daily, record, and provide at future appointments  Medication management . Pharmacist Clinical Goal(s): o Over the next 4 weeks, patient will work with PharmD and providers to maintain optimal medication adherence . Current pharmacy: [facility] . Interventions o Comprehensive medication review performed. o Utilize [facility] pharmacy for medication synchronization, packaging and delivery . Patient self care activities - Over the next 4 weeks, patient will: o Focus on medication adherence by transitioning to adherence packaging and medication synchronization o Take medications as prescribed o Report any questions or concerns to PharmD and/or provider(s)  Initial goal documentation        Christine Mcdowell was given information about Chronic Care Management services today including:  1. CCM service includes personalized support from designated clinical staff supervised by her  physician, including individualized plan of care and coordination with other care providers 2. 24/7 contact phone numbers for assistance for urgent and routine care needs. 3. Standard insurance, coinsurance, copays and deductibles  apply for chronic care management only during months in which we provide at least 20 minutes of these services. Most insurances cover these services at 100%, however patients may be responsible for any copay, coinsurance and/or deductible if applicable. This service may help you avoid the need for more expensive face-to-face services. 4. Only one practitioner may furnish and bill the service in a calendar month. 5. The patient may stop CCM services at any time (effective at the end of the month) by phone call to the office staff.  Patient agreed to services and verbal consent obtained.   The patient verbalized understanding of instructions provided today and agreed to receive a mailed copy of patient instruction and/or educational materials. Telephone follow up appointment with pharmacy team member scheduled for: 05/19/20 @ 2pm  Jannette Fogo, PharmD Clinical Pharmacist [facility] [phone]   Systemic Lupus Erythematosus, Adult Systemic lupus erythematosus (SLE) is a long-term (chronic) disease that can affect many parts of the body. SLE is an autoimmune disease. With this type of disease, the body's defense system (immune system) mistakenly attacks healthy tissues. This can cause damage to the skin, joints, blood vessels, brain, kidneys, lungs, heart, and other internal organs. It causes pain, irritation, and inflammation. What are the causes? The cause of this condition is not known. What increases the risk? The following factors may make you more likely to develop this condition:  Being female.  Being of Asian, Hispanic, or African-American descent.  Having a family history of the condition.  Being exposed to tobacco smoke or smoking cigarettes.  Having an infection with a virus, such as Epstein-Barr virus.  Having a history of exposure to silica dust, metals, chemicals, mold or mildew, or insecticides.  Using oral contraceptives or hormone  replacement therapy. What are the signs or symptoms? This condition can affect almost any organ or system in the body. Symptoms of the condition depend on which organ or system is affected. The most common symptoms include:  Fever.  Fatigue.  Weight loss.  Muscle aches.  Joint pain.  Skin rashes, especially over the nose and cheeks (butterfly rash) and after sun exposure. Symptoms can come and go. A period of time when symptoms get worse or come back is called a flare. A period of time with no symptoms is called a remission. How is this diagnosed? This condition is diagnosed based on:  Your symptoms.  Your medical history.  A physical exam. You may also have tests, including:  Blood tests.  Urine tests.  A chest X-ray. You may be referred to an autoimmune disease specialist (rheumatologist). How is this treated? There is no cure for this condition, but treatment can help to control symptoms, prevent flares (keep symptoms in remission), and prevent damage to the heart, lungs, kidneys, and other organs. Treatment will depend on what symptoms you are having and what organs or systems are affected. Treatment may involve taking a combination of medicines over time. Common medicines used to treat this condition include:  Antimalarial medicines to control symptoms, prevent flares, and protect against organ damage.  Corticosteroids and NSAIDs to reduce inflammation.  Medicines to weaken your immune system (immunosuppressants).  Biologic response modifiers to reduce inflammation and damage. Follow these instructions at home: Eating and drinking  Eat a heart-healthy diet. This may  include: ? Eating high-fiber foods, such as fresh fruits and vegetables, whole grains, and beans. ? Eating heart-healthy fats (omega-3 fats), such as fish, flaxseed, and flaxseed oil. ? Limiting foods that are high in saturated fat and cholesterol, such as processed and fried foods, fatty meat, and  full-fat dairy. ? Limiting how much salt (sodium) you eat.  Include calcium and vitamin D in your diet. Good sources of calcium and vitamin D include: ? Low-fat dairy products such as milk, yogurt, and cheese. ? Certain fish, such as fresh or canned salmon, tuna, and sardines. ? Products that have calcium and vitamin D added to them (fortified products), such as fortified cereals or juice. Medicines  Take over-the-counter and prescription medicines only as told by your health care provider.  Do not take any medicines that contain estrogen without first checking with your health care provider. Estrogen can trigger flares and may increase your risk for blood clots. Lifestyle      Stay active, as directed by your health care provider.  Do not use any products that contain nicotine or tobacco, such as cigarettes and e-cigarettes. If you need help quitting, ask your health care provider.  Protect your skin from the sun by applying sunblock and wearing protective hats and clothing.  Learn as much as you can about your condition and have a good support system in place. Support may come from family, friends, or a lupus support group. General instructions  Work closely with all of your health care providers to manage your condition.  Stay up to date on all vaccines as directed by your health care provider.  Keep all follow-up visits as told by your health care provider. This is important. Contact a health care provider if:  You have a fever.  Your symptoms flare.  You develop new symptoms.  You have bloody, foamy, or coffee-colored urine.  There are changes in your urination. For example, you urinate more often at night.  You think that you may be depressed or have anxiety.  You become pregnant or plan to become pregnant. Pregnancy in women with this condition is considered high risk. Get help right away if:  You have chest pain.  You have trouble breathing.  You have a  seizure.  You suddenly get a very bad headache.  You suddenly develop facial or body weakness.  You cannot speak.  You cannot understand speech. These symptoms may represent a serious problem that is an emergency. Do not wait to see if the symptoms will go away. Get medical help right away. Call your local emergency services (911 in the [place] Do not drive yourself to the hospital. Summary  Systemic lupus erythematosus (SLE) is a long-term disease that can affect many parts of the body.  SLE is an autoimmune disease. That means your body's defense system (immune system) mistakenly attacks healthy tissues.  There is no cure for this condition, but treatment can help to control symptoms, prevent flares, and prevent damage to your organs. Treatment may involve taking a combination of medicines over time. This information is not intended to replace advice given to you by your health care provider. Make sure you discuss any questions you have with your health care provider. Document Revised: 01/22/2018 Document Reviewed: 01/16/2018 Elsevier Patient Education  [place].  Type 2 Diabetes Mellitus, Self Care, Adult When you have type 2 diabetes (type 2 diabetes mellitus), you must make sure your blood sugar (glucose) stays in a healthy range. You can do  this with:  Nutrition.  Exercise.  Lifestyle changes.  Medicines or insulin, if needed.  Support from your doctors and others. How to stay aware of blood sugar   Check your blood sugar level every day, as often as told.  Have your A1c (hemoglobin A1c) level checked two or more times a year. Have it checked more often if your doctor tells you to. Your doctor will set personal treatment goals for you. Generally, you should have these blood sugar levels:  Before meals (preprandial): 80-130 mg/dL (4.4-7.2 mmol/L).  After meals (postprandial): below 180 mg/dL (10 mmol/L).  A1c level: less than 7%. How to manage high and low  blood sugar Signs of high blood sugar High blood sugar is called hyperglycemia. Know the signs of high blood sugar. Signs may include:  Feeling: ? Thirsty. ? Hungry. ? Very tired.  Needing to pee (urinate) more than usual.  Blurry vision. Signs of low blood sugar Low blood sugar is called hypoglycemia. This is when blood sugar is at or below 70 mg/dL (3.9 mmol/L). Signs may include:  Feeling: ? Hungry. ? Worried or nervous (anxious). ? Sweaty and clammy. ? Confused. ? Dizzy. ? Sleepy. ? Sick to your stomach (nauseous).  Having: ? A fast heartbeat. ? A headache. ? A change in your vision. ? Jerky movements that you cannot control (seizure). ? Tingling or no feeling (numbness) around your mouth, lips, or tongue.  Having trouble with: ? Moving (coordination). ? Sleeping. ? Passing out (fainting). ? Getting upset easily (irritability). Treating low blood sugar To treat low blood sugar, eat or drink something sugary right away. If you can think clearly and swallow safely, follow the 15:15 rule:  Take 15 grams of a fast-acting carb (carbohydrate). Talk with your doctor about how much you should take.  Some fast-acting carbs are: ? Sugar tablets (glucose pills). Take 3-4 pills. ? 6-8 pieces of hard candy. ? 4-6 oz (120-150 mL) of fruit juice. ? 4-6 oz (120-150 mL) of regular (not diet) soda. ? 1 Tbsp (15 mL) honey or sugar.  Check your blood sugar 15 minutes after you take the carb.  If your blood sugar is still at or below 70 mg/dL (3.9 mmol/L), take 15 grams of a carb again.  If your blood sugar does not go above 70 mg/dL (3.9 mmol/L) after 3 tries, get help right away.  After your blood sugar goes back to normal, eat a meal or a snack within 1 hour. Treating very low blood sugar If your blood sugar is at or below 54 mg/dL (3 mmol/L), you have very low blood sugar (severe hypoglycemia). This is an emergency. Do not wait to see if the symptoms will go away. Get  medical help right away. Call your local emergency services (911 in the [place] If you have very low blood sugar and you cannot eat or drink, you may need a glucagon shot (injection). A family member or friend should learn how to check your blood sugar and how to give you a glucagon shot. Ask your doctor if you need to have a glucagon shot kit at home. Follow these instructions at home: Medicine  Take insulin and diabetes medicines as told.  If your doctor says you should take more or less insulin and medicines, do this exactly as told.  Do not run out of insulin or medicines. Having diabetes can raise your risk for other long-term conditions. These include heart disease and kidney disease. Your doctor may prescribe medicines to  help you not have these problems. Food   Make healthy food choices. These include: ? Chicken, fish, egg whites, and beans. ? Oats, whole wheat, bulgur, brown rice, quinoa, and millet. ? Fresh fruits and vegetables. ? Low-fat dairy products. ? Nuts, avocado, olive oil, and canola oil.  Meet with a food specialist (dietitian). He or she can help you make an eating plan that is right for you.  Follow instructions from your doctor about what you cannot eat or drink.  Drink enough fluid to keep your pee (urine) pale yellow.  Keep track of carbs that you eat. Do this by reading food labels and learning food serving sizes.  Follow your sick day plan when you cannot eat or drink normally. Make this plan with your doctor so it is ready to use. Activity  Exercise 3 or more times a week.  Do not go more than 2 days without exercising.  Talk with your doctor before you start a new exercise. Your doctor may need to tell you to change: ? How much insulin or medicines you take. ? How much food you eat. Lifestyle  Do not use any tobacco products. These include cigarettes, chewing tobacco, and e-cigarettes. If you need help quitting, ask your doctor.  Ask your doctor  how much alcohol is safe for you.  Learn to deal with stress. If you need help with this, ask your doctor. Body care   Stay up to date with your shots (immunizations).  Have your eyes and feet checked by a doctor as often as told.  Check your skin and feet every day. Check for cuts, bruises, redness, blisters, or sores.  Brush your teeth and gums two times a day. Floss one or more times a day.  Go to the dentist one or more times every 6 months.  Stay at a healthy weight. General instructions  Take over-the-counter and prescription medicines only as told by your doctor.  Share your diabetes care plan with: ? Your work or school. ? People you live with.  Carry a card or wear jewelry that says you have diabetes.  Keep all follow-up visits as told by your doctor. This is important. Questions to ask your doctor  Do I need to meet with a diabetes educator?  Where can I find a support group for people with diabetes? Where to find more information To learn more about diabetes, visit:  American Diabetes Association: www.diabetes.org  American Association of Diabetes Educators: www.diabeteseducator.org Summary  When you have type 2 diabetes, you must make sure your blood sugar (glucose) stays in a healthy range.  Check your blood sugar every day, as often as told.  Having diabetes can raise your risk for other conditions. Your doctor may prescribe medicines to help you not have these problems.  Keep all follow-up visits as told by your doctor. This is important. This information is not intended to replace advice given to you by your health care provider. Make sure you discuss any questions you have with your health care provider. Document Revised: 06/01/2018 Document Reviewed: 01/12/2016 Elsevier Patient Education  2020 [employer].

## 2020-05-03 ENCOUNTER — Telehealth (HOSPITAL_COMMUNITY): Payer: Self-pay | Admitting: *Deleted

## 2020-05-03 DIAGNOSIS — I5022 Chronic systolic (congestive) heart failure: Secondary | ICD-10-CM

## 2020-05-03 NOTE — Telephone Encounter (Signed)
Pt aware, agreeable and verbalized understanding, repeat lab sch for 5/20

## 2020-05-03 NOTE — Telephone Encounter (Signed)
-----   Message from Jolaine Artist, MD sent at 05/01/2020  9:56 PM EDT ----- Increase potassium to 40 daily. Have her take 80 tomorrow. Repeat BMET 10 days

## 2020-05-04 ENCOUNTER — Other Ambulatory Visit (HOSPITAL_COMMUNITY): Payer: Self-pay | Admitting: *Deleted

## 2020-05-04 MED ORDER — SACUBITRIL-VALSARTAN 97-103 MG PO TABS: 1.0000 | ORAL_TABLET | Freq: Two times a day (BID) | ORAL | 6 refills | Status: DC

## 2020-05-04 MED ORDER — POTASSIUM CHLORIDE CRYS ER 20 MEQ PO TBCR: 40.0000 meq | EXTENDED_RELEASE_TABLET | Freq: Every day | ORAL | 6 refills | Status: DC

## 2020-05-04 MED ORDER — HYDRALAZINE HCL 100 MG PO TABS: 100.0000 mg | ORAL_TABLET | Freq: Three times a day (TID) | ORAL | 6 refills | Status: DC

## 2020-05-04 MED ORDER — CARVEDILOL 25 MG PO TABS: ORAL_TABLET | ORAL | 6 refills | Status: DC

## 2020-05-04 MED ORDER — SPIRONOLACTONE 25 MG PO TABS: ORAL_TABLET | ORAL | 6 refills | Status: DC

## 2020-05-04 MED ORDER — TORSEMIDE 20 MG PO TABS: ORAL_TABLET | ORAL | 6 refills | Status: DC

## 2020-05-04 MED ORDER — METOLAZONE 2.5 MG PO TABS: ORAL_TABLET | ORAL | 3 refills | Status: DC

## 2020-05-04 NOTE — Addendum Note (Signed)
Addended by: Scarlette Calico on: 05/04/2020 04:03 PM   Modules accepted: Orders

## 2020-05-08 DIAGNOSIS — I509 Heart failure, unspecified: Secondary | ICD-10-CM | POA: Diagnosis not present

## 2020-05-08 DIAGNOSIS — Z79899 Other long term (current) drug therapy: Secondary | ICD-10-CM | POA: Diagnosis not present

## 2020-05-08 DIAGNOSIS — E785 Hyperlipidemia, unspecified: Secondary | ICD-10-CM | POA: Diagnosis not present

## 2020-05-08 DIAGNOSIS — Z95810 Presence of automatic (implantable) cardiac defibrillator: Secondary | ICD-10-CM | POA: Diagnosis not present

## 2020-05-08 DIAGNOSIS — Z8616 Personal history of COVID-19: Secondary | ICD-10-CM | POA: Diagnosis not present

## 2020-05-08 DIAGNOSIS — M2012 Hallux valgus (acquired), left foot: Secondary | ICD-10-CM | POA: Diagnosis not present

## 2020-05-08 DIAGNOSIS — G4733 Obstructive sleep apnea (adult) (pediatric): Secondary | ICD-10-CM | POA: Diagnosis not present

## 2020-05-08 DIAGNOSIS — M329 Systemic lupus erythematosus, unspecified: Secondary | ICD-10-CM | POA: Diagnosis not present

## 2020-05-08 DIAGNOSIS — I110 Hypertensive heart disease with heart failure: Secondary | ICD-10-CM | POA: Diagnosis not present

## 2020-05-08 DIAGNOSIS — D688 Other specified coagulation defects: Secondary | ICD-10-CM | POA: Diagnosis not present

## 2020-05-08 DIAGNOSIS — E119 Type 2 diabetes mellitus without complications: Secondary | ICD-10-CM | POA: Diagnosis not present

## 2020-05-08 DIAGNOSIS — J45909 Unspecified asthma, uncomplicated: Secondary | ICD-10-CM | POA: Diagnosis not present

## 2020-05-08 DIAGNOSIS — D509 Iron deficiency anemia, unspecified: Secondary | ICD-10-CM | POA: Diagnosis not present

## 2020-05-09 ENCOUNTER — Telehealth: Payer: Self-pay

## 2020-05-09 ENCOUNTER — Encounter (HOSPITAL_COMMUNITY): Payer: Self-pay

## 2020-05-09 DIAGNOSIS — Z01812 Encounter for preprocedural laboratory examination: Secondary | ICD-10-CM | POA: Diagnosis not present

## 2020-05-09 DIAGNOSIS — E119 Type 2 diabetes mellitus without complications: Secondary | ICD-10-CM | POA: Diagnosis not present

## 2020-05-09 DIAGNOSIS — M2012 Hallux valgus (acquired), left foot: Secondary | ICD-10-CM | POA: Diagnosis not present

## 2020-05-09 DIAGNOSIS — Z20822 Contact with and (suspected) exposure to covid-19: Secondary | ICD-10-CM | POA: Diagnosis not present

## 2020-05-09 DIAGNOSIS — Z794 Long term (current) use of insulin: Secondary | ICD-10-CM | POA: Diagnosis not present

## 2020-05-10 ENCOUNTER — Telehealth (HOSPITAL_COMMUNITY): Payer: Self-pay | Admitting: *Deleted

## 2020-05-10 DIAGNOSIS — Z79899 Other long term (current) drug therapy: Secondary | ICD-10-CM | POA: Diagnosis not present

## 2020-05-10 DIAGNOSIS — M0560 Rheumatoid arthritis of unspecified site with involvement of other organs and systems: Secondary | ICD-10-CM | POA: Diagnosis not present

## 2020-05-10 DIAGNOSIS — M3210 Systemic lupus erythematosus, organ or system involvement unspecified: Secondary | ICD-10-CM | POA: Diagnosis not present

## 2020-05-10 NOTE — Telephone Encounter (Signed)
Received a mychart message from pt (see below). I called pt to get more information and no answer/left vm for pt to return my call.   Great day Dr B, I am suddenly getting short of breath.  My weight hasn't changed

## 2020-05-11 ENCOUNTER — Telehealth: Payer: Self-pay | Admitting: Internal Medicine

## 2020-05-11 ENCOUNTER — Other Ambulatory Visit (HOSPITAL_COMMUNITY): Payer: Medicare Other

## 2020-05-11 NOTE — Telephone Encounter (Signed)
Dr Melvyn Novas- pt wants to switch and Dr Shearon Stalls has soonest available consult- ok to switch to her?

## 2020-05-12 NOTE — Telephone Encounter (Signed)
Dr. Shearon Stalls - please advise if you are okay with this switch. Thanks.

## 2020-05-12 NOTE — Telephone Encounter (Signed)
She is steroid dep for lupus and did not have any objective evidence of asthma on my eval  even during  CPST before and after spirometry (all restrictive) with clinical dx UACS / pseudoasthma.    Agree should see definitely see Dr Shearon Stalls for re-eval if her PCP is concerned about symptoms that suggest asthma   Copy to [name]

## 2020-05-15 ENCOUNTER — Other Ambulatory Visit: Payer: Self-pay

## 2020-05-15 DIAGNOSIS — E119 Type 2 diabetes mellitus without complications: Secondary | ICD-10-CM | POA: Diagnosis not present

## 2020-05-15 DIAGNOSIS — G4733 Obstructive sleep apnea (adult) (pediatric): Secondary | ICD-10-CM | POA: Diagnosis not present

## 2020-05-15 DIAGNOSIS — D509 Iron deficiency anemia, unspecified: Secondary | ICD-10-CM | POA: Diagnosis not present

## 2020-05-15 DIAGNOSIS — D688 Other specified coagulation defects: Secondary | ICD-10-CM | POA: Diagnosis not present

## 2020-05-15 DIAGNOSIS — Z8616 Personal history of COVID-19: Secondary | ICD-10-CM | POA: Diagnosis not present

## 2020-05-15 DIAGNOSIS — M2012 Hallux valgus (acquired), left foot: Secondary | ICD-10-CM | POA: Diagnosis not present

## 2020-05-15 DIAGNOSIS — Z79899 Other long term (current) drug therapy: Secondary | ICD-10-CM | POA: Diagnosis not present

## 2020-05-15 DIAGNOSIS — E1169 Type 2 diabetes mellitus with other specified complication: Secondary | ICD-10-CM

## 2020-05-15 DIAGNOSIS — Z9889 Other specified postprocedural states: Secondary | ICD-10-CM | POA: Diagnosis not present

## 2020-05-15 DIAGNOSIS — J45909 Unspecified asthma, uncomplicated: Secondary | ICD-10-CM | POA: Diagnosis not present

## 2020-05-15 DIAGNOSIS — M329 Systemic lupus erythematosus, unspecified: Secondary | ICD-10-CM | POA: Diagnosis not present

## 2020-05-15 DIAGNOSIS — E785 Hyperlipidemia, unspecified: Secondary | ICD-10-CM | POA: Diagnosis not present

## 2020-05-15 DIAGNOSIS — I509 Heart failure, unspecified: Secondary | ICD-10-CM | POA: Diagnosis not present

## 2020-05-15 DIAGNOSIS — Z95810 Presence of automatic (implantable) cardiac defibrillator: Secondary | ICD-10-CM | POA: Diagnosis not present

## 2020-05-15 DIAGNOSIS — I110 Hypertensive heart disease with heart failure: Secondary | ICD-10-CM | POA: Diagnosis not present

## 2020-05-15 MED ORDER — NOVOLOG FLEXPEN 100 UNIT/ML ~~LOC~~ SOPN: PEN_INJECTOR | SUBCUTANEOUS | 2 refills | Status: DC

## 2020-05-15 MED ORDER — INSULIN LISPRO 100 UNIT/ML ~~LOC~~ SOLN: 17.0000 [IU] | Freq: Three times a day (TID) | SUBCUTANEOUS | 0 refills | Status: DC

## 2020-05-15 NOTE — Telephone Encounter (Signed)
Message from Dr. Shearon Stalls: Christine Geralds, MD  [facility] Triage Pool 2 hours ago (10:20 AM)   I'm technically not seeing additional new consults these last two weeks before my leave. However can offer her a spot the morning of 5/27 or 5/28 if she wants to be seen by me    Routing comment    Attempted to call pt but unable to reach. Left message for her to return call.

## 2020-05-16 NOTE — Telephone Encounter (Signed)
Called and spoke with patient. She had surgery on her foot yesterday and is not able to see Dr. Shearon Stalls before going out on maternity leave. I have placed a recall for 3 months for her to set up an appointment with Dr. Shearon Stalls. Patient expressed understanding. Nothing further needed at this time.

## 2020-05-18 ENCOUNTER — Ambulatory Visit (INDEPENDENT_AMBULATORY_CARE_PROVIDER_SITE_OTHER): Payer: Medicare Other

## 2020-05-18 DIAGNOSIS — Z95810 Presence of automatic (implantable) cardiac defibrillator: Secondary | ICD-10-CM

## 2020-05-18 DIAGNOSIS — I5022 Chronic systolic (congestive) heart failure: Secondary | ICD-10-CM

## 2020-05-19 ENCOUNTER — Telehealth (HOSPITAL_COMMUNITY): Payer: Self-pay | Admitting: *Deleted

## 2020-05-19 ENCOUNTER — Telehealth: Payer: Self-pay

## 2020-05-19 NOTE — Telephone Encounter (Signed)
Pt called c/o shortness of breath, fatigue, and weight gain of about 5lbs. PT has metolazone as needed for weight gain of 3 lbs overnight or 5lbs in a week. Per Kevan Rosebush, RN take a dose of metolazone and call our office if this does not help. Pt aware and agreeable with plan.

## 2020-05-19 NOTE — Progress Notes (Signed)
EPIC Encounter for ICM Monitoring  Patient Name: Christine Mcdowell is a 49 y.o. female Date: 05/19/2020 Primary Care Physican: Christine Chard, MD Primary Cardiologist:Christine Mcdowell Electrophysiologist:Christine Mcdowell LastWeight:184lbs  Attempted call to patient and unable to reach.  Left message to return call. Transmission reviewed.  Message also sent via mychart with results  5/26/2021[employer] Heart Failure Index25 suggesting possible fluid accumulation since 05/12/2020.   Prescribed:  Torsemide20 mg take1tablet (20 mg total)Mon, Wed and Friday.  Potassium 20 mEq take 2 tablets daily.  Spironolactone 25 mg take 1 tablet daily.  Metolazone 2.5 mg takeone tablet as needed for weight gain of 5 lbs within 3 days.   Labs: 05/01/2020 Creatinine 0.81, BUN 14, Potassium 3.3, Sodium 143, GFR >60 04/19/2020 Creatinine 0.81, BUN 11, Potassium 3.2, Sodium 145, GFR >60  01/31/2020 Creatinine 0.88, BUN 13, Potassium 3.4, Sodium 141, GFR >60  A complete set of results can be found in Results Review.  Recommendations:Unable to reach.  Will advised patient to take extra Torsemide or Metolazone with Potassium if reached.  Follow-up plan: ICM clinic phone appointment on6/01/2020 to recheck fluid levels. 91 day device clinic remote transmission 05/31/2020. Office visit with Dr Christine Mcdowell on8/30/2021.  Copy of ICM check sent to Dr.Taylor and Dr Christine Mcdowell  [place]    [place], RN 05/19/2020 9:34 AM

## 2020-05-19 NOTE — Progress Notes (Signed)
Attempted return call to patient as requested by voice mail message.  

## 2020-05-19 NOTE — Telephone Encounter (Signed)
Remote ICM transmission received.  Attempted call to patient regarding ICM remote transmission and left message to return call   

## 2020-05-24 ENCOUNTER — Ambulatory Visit: Payer: Self-pay

## 2020-05-24 ENCOUNTER — Ambulatory Visit (INDEPENDENT_AMBULATORY_CARE_PROVIDER_SITE_OTHER): Payer: Medicare Other

## 2020-05-24 ENCOUNTER — Other Ambulatory Visit: Payer: Self-pay

## 2020-05-24 DIAGNOSIS — I5022 Chronic systolic (congestive) heart failure: Secondary | ICD-10-CM

## 2020-05-24 DIAGNOSIS — E1169 Type 2 diabetes mellitus with other specified complication: Secondary | ICD-10-CM

## 2020-05-24 DIAGNOSIS — Z95810 Presence of automatic (implantable) cardiac defibrillator: Secondary | ICD-10-CM

## 2020-05-24 DIAGNOSIS — E785 Hyperlipidemia, unspecified: Secondary | ICD-10-CM

## 2020-05-24 NOTE — Chronic Care Management (AMB) (Signed)
Chronic Care Management Pharmacy  Name: Christine Mcdowell  MRN: [id number] DOB: 1971/09/13  Chief Complaint/ HPI  Christine Mcdowell,  49 y.o. , female presents for their Follow-Up CCM visit with the clinical pharmacist via telephone due to COVID-19 Pandemic.  PCP : [name], MD, [name], FNP  Their chronic conditions include: Hypertension, Chronic Systolic Congestive Heart Failure, Diabetes, Asthma, Allergies, Systemic Lupus Erythematosus  Since our last visit patient had a bunionectomy that went well. Pt did have to change the dressing herself. She reports that the initial pain medications made her sick so she had to switch to tramadol. She has also had food poisoning since we last spoke.   Office Visits: 04/12/20- OV w/ [name]-Presented post COVID pneumonia complaining of back pain and cold sweats; back pain is a recurrent problem starting > 1 month ago. Referred to pulmonology for breathing issues. Advised to wait until June for COVID vaccine due to having antibody infusion on 03/08/20. Labs ordered (TSH, FH, LSH, VitD). Referred to physical therapy for back pain.   03/07/20- Telemed visit regarding COVID infection (tested positive 3/14). Cough treated with Hydromet, amoxicillin (due to frequent pneumonia episodes), albuterol nebs and HFA  11/24/19- Telemed visit- Presented with cough (history of asthma) could consider LABA, uncontrolled diabetes (prednisone taper for 8 weeks). Follow up in 3 months  Consult Visits:  05/19/20 Cardiology telephone call: Pt call complaining of SOB, fatigue, and weight gain of 5lbs. Pt instructed to take an additional metolazone and call back if it does not help.   05/18/20 Cardiology telephone call: ICM (HeartLogic Heart Failure Index 25) suggested possible fluid accumulation. When reached will instruct pt to take ands additional torsemide or metolazone with potassium.   05/15/20 Bunionectomy. Nonweightbearing post surgery. Dressing will be  removed at first follow up on 05/29/20. Discharged with oxycodone '10mg'$  for pain control. Can use NSAIDs or Tylenol if needed. Colace '100mg'$  twice daily will on narcotic. Aspirin '81mg'$  twice daily.   05/01/20 Cardiology telephone call: Increase potassium to 52mq daily and take 664m on 5/11. Repeat BMET in 10 days.   05/01/20 Hematology OV w/ Dr. OwRoxy MannsReturn patient visit to evaluate possible bleeding disorder prior to bunionectomy. Difficult to determine whether pt has bleeding phenotype or dysfibrinogenemia. Due to low risk of bleeding from bunionectomy, plan for 10 units cryoprecipitate preoperatively and monitoring overnight post surgery. If more bleeding than anticipated occurs, check DIC and transfuse 5 units cryoprecipitate if fibrinogen <100. Follow up in 1 year.   04/21/20 Cardiology telephone call: Pt states she was taken off of potassium when she had COVID. Pt instructed to take 6028mtoday and then continue 59m94maily.   04/19/20- Cardiology follow up w/ Dr. BensHaroldine Lawseathing has improved, moderate LE edema, Echo performed (LVEF 45-50%); will follow up and repeat echo in 4 weeks. Continue current meds, take an additional torsemide or metolazone as needed.  04/10/20- Podiatry visit with Dr. WellRock Nephew evaluation of left bunion (acquired hallux valgus) causing pain with walking ad wearing shoes. Xray performed. Appt scheduled to surgically correct. 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 Hematology recommends pre-op cryoprecipitate and monitoring after surgery.  02/25/20- Post-op visit with Ophthalmology- Need to reschedule Plaquenil eye exam  02/23/20- Ortho post-op follow up with [name] right knee arthoplasty 1 year ago. Patient reports intermittent episodes of swelling typically after standing for 45-60 minutes. Labs ordered. Follow up in 1 year. Knee-high compression stockings only made the problem worse according to patient. Encourage ambulation and ROM  exercises.  Swelling possibly due to RA.  02/16/20- Actemra infusion by Rheumatology. Denied any symptoms post infusion, no chest pain, no SOB (02/17/20)  02/07/20-Rheumatology Televisit- Continue plaquenil '200mg'$  BID (need yearly eye exam), MTX '20mg'$  weekly (check toxicity every 3 months), folic acid daily, and prednisone '20mg'$  daily. Start Actemra infusion. Consider tapering down prednisone a few weeks after starting infusion. Continue bisphosphonate, encourage calcium and Vitamin D.   02/01/20- Televisit- Increase hydralazine to '100mg'$  TID and start Potassium 38mq daily per cardiology  01/31/20- ED visit- Chest pain/palpitations, ECG no acute changes  12/06/19- Telephone encounter-  Fluconazole '200mg'$  daily for 2 days prescribed by Dr. BHaroldine Lawsdue to yeast infection from FIran   11/22/19- ED visit- Presented for right upper and right lower abdominal chest pain. Pericardial effusion and cardiac tamponade present upon CT scan.   11/10/19- Cardiology OV- Hydralazine increased to '75mg'$  three times daily, add Farxiga '10mg'$  daily  CCM Encounters: 04/10/20- TV w/ RN- Encouraged self management of CHF, Follow up with [facility] for treatment of asthma, difficulty getting Dexcom supplies from pharmacy ([facility]).   03/13/20- TV w/ Dr. PBlanca Friend Comprehensive medication review performed, contacted insurance regarding Dexcom possibly needing PA  01/03/20- TV w/ Dr. PBlanca Friend Patient education provided; pt received Dexcom delivery on 12/13/20, encourage pt to call back for refills when needed  12/13/19- TV w/ Dr. PWaldon Reiningmedication review performed; [place] [facility]   12/06/19- TV w/ Dr. PBlanca Friend Account set up with [facility]; additional paperwork can be completed as needed  11/23/19- TV w/ Dr. PBlanca Friend Comprehensive medication review performed, [facility], will [facility]  11/22/19- TV w/ Dr. PBlanca Friend Comprehensive medication review performed, Lost prescription override for Dexcom scanner divide denied by [employer] (only 1 device per 365 days). [facility] to [facility] Medications: Outpatient Encounter Medications as of 05/24/2020  Medication Sig  . albuterol (ACCUNEB) 1.25 MG/3ML nebulizer solution Take 3 mLs (1.25 mg total) by nebulization every 6 (six) hours as needed for wheezing.  .Marland Kitchenaspirin 81 MG chewable tablet Chew 162 mg by mouth daily. Taking 2 tablets  . atorvastatin (LIPITOR) 10 MG tablet TAKE 1 TABLET(10 MG) BY MOUTH DAILY  . Azelastine HCl 137 MCG/SPRAY SOLN Place 2 sprays into the nose daily.  . Bacillus Coagulans-Inulin (PROBIOTIC) 1-250 BILLION-MG CAPS Take 1 capsule by mouth daily.  . blood glucose meter kit and supplies KIT Dispense based on patient and insurance preference. Use to check blood sugars daily E11.9  . carvedilol (COREG) 25 MG tablet TAKE 1 TABLET(25 MG) BY MOUTH TWICE DAILY WITH A MEAL  . Continuous Blood Gluc Receiver (DEXCOM G6 RECEIVER) DEVI 1 Device by Does not apply route 3 (three) times daily before meals.  . Continuous Blood Gluc Sensor (DEXCOM G6 SENSOR) MISC Inject 1 each into the skin 3 (three) times daily.  . Continuous Blood Gluc Transmit (DEXCOM G6 TRANSMITTER) MISC 1 Device by Does not apply route 3 (three) times daily before meals.  . diclofenac Sodium (VOLTAREN) 1 % GEL APPLY SMALL AMOUNT TO INVOLVED JOINTS UP TO TWICE DAILY  . folic acid (FOLVITE) 1 MG tablet TAKE 1 TABLET BY MOUTH EVERY MORNING  . hydrALAZINE (APRESOLINE) 100 MG tablet Take 1 tablet (100 mg total) by mouth 3 (three) times daily.  . hydroxychloroquine (PLAQUENIL) 200 MG tablet Take 1 tablet (200 mg total) by mouth 2 (two) times  daily.  . insulin lispro (HUMALOG) 100 UNIT/ML injection Inject 0.17 mLs (17 Units total) into the skin 3 (three) times daily with meals. Inject 17u three times a day  .  ipratropium (ATROVENT) 0.03 % nasal spray USE 2 SPRAYS IN EACH NOSTRIL THREE TIMES DAILY AS NEEDED FOR RHINITIS  . Magnesium 400 MG TABS Take 1 tablet by mouth daily.   . meloxicam (MOBIC) 15 MG tablet Take 15 mg by mouth daily.   . methotrexate (RHEUMATREX) 2.5 MG tablet Take 20 mg by mouth every Friday.   . metolazone (ZAROXOLYN) 2.5 MG tablet TAKE 1 TABLET BY MOUTH AS NEEDED FOR WEIGHT GAIN OF 5 POUNDS WITHIN 3 DAYS AS DIRECTED  . Multiple Vitamin (MULTIVITAMIN WITH MINERALS) TABS Take 1 tablet by mouth every morning.   . ondansetron (ZOFRAN) 4 MG tablet Take 1 tablet (4 mg total) by mouth every 8 (eight) hours as needed for nausea or vomiting.  . potassium chloride SA (KLOR-CON) 20 MEQ tablet Take 2 tablets (40 mEq total) by mouth daily.  . predniSONE (DELTASONE) 10 MG tablet Take 2 tabs daily x 5 days, then 1.5 tabs daily x 5 days, then 1 tab daily x 5 days, then 0.5 tabs daily x 5 days  . PROAIR HFA 108 (90 Base) MCG/ACT inhaler INHALE 2 PUFFS BY MOUTH EVERY DAY AS NEEDED FOR SHORTNESS OF BREATH  . sacubitril-valsartan (ENTRESTO) 97-103 MG Take 1 tablet by mouth 2 (two) times daily.  Marland Kitchen spironolactone (ALDACTONE) 25 MG tablet TAKE 1 TABLET(25 MG) BY MOUTH TWICE DAILY  . torsemide (DEMADEX) 20 MG tablet Take 1 tablet Monday, Wednesday , and Friday  . traMADol (ULTRAM) 50 MG tablet Take 1 tablet (50 mg total) by mouth every 6 (six) hours as needed. (Patient not taking: Reported on 05/01/2020)  . traZODone (DESYREL) 50 MG tablet Take 50 mg by mouth at bedtime.  Tyler Aas FLEXTOUCH 100 UNIT/ML FlexTouch Pen Inject 14 Units into the skin daily.   . Vitamin D, Ergocalciferol, (DRISDOL) 1.25 MG (50000 UT) CAPS capsule TAKE 1 CAPSULE BY MOUTH 2 TIMES EVERY WEEK  . [DISCONTINUED] metFORMIN (GLUCOPHAGE) 500 MG tablet TAKE 1 TABLET BY MOUTH EVERY DAY WITH THE MORNING AND EVENING MEAL  . [DISCONTINUED] montelukast (SINGULAIR) 10 MG tablet TAKE 1 TABLET BY MOUTH DAILY   No facility-administered encounter  medications on file as of 05/24/2020.   Current Diagnosis/Assessment:  SDOH Interventions     Most Recent Value  SDOH Interventions  Financial Strain Interventions Other (Comment)  [Will mail patient assistance applications for Entresto and Tresiba]      Goals Addressed            This Visit's Progress   . Pharmacy Care Plan       CARE PLAN ENTRY  Current Barriers:  . Chronic Disease Management support, education, and care coordination needs related to Hypertension, Hyperlipidemia, Diabetes, and Heart Failure   Hypertension . Pharmacist Clinical Goal(s): o Over the next 90 days, patient will work with PharmD and providers to maintain BP goal <130/80 . Current regimen:  o Carvedilol '25mg'$  twice daily with a meal o Spironolactone '25mg'$  twice daily o Hydralazine '100mg'$  three times daily . Interventions: o Recommended increase in physical activity  o Recommended heart healthy diet . Patient self care activities - Over the next 4 weeks, patient will: o Check BP daily, document, and provide at future appointments o Ensure daily salt intake < 2300 mg/day o Exercise 15 minutes daily 5 times per week  Hyperlipidemia .  Pharmacist Clinical Goal(s): o Over the next 90 days, patient will work with PharmD and providers to maintain LDL goal < 100 . Current regimen:  o Atorvastatin '10mg'$  daily . Interventions: o Recommend increase in physical activity . Patient self care activities - Over the next 4 weeks, patient will: o Exercise 15 minutes daily 5 times per week o Limit fried foods and red meats  Diabetes . Pharmacist Clinical Goal(s): o Over the next 90 days, patient will work with PharmD and providers to maintain A1c goal <7% . Current regimen:  o Metformin '500mg'$  with morning and evening meal o Novolog Flexpen up to 17 units three times daily as directed per sliding scale o Tresiba 15 units daily . Interventions: o Verified Dexcom supplies delivered to patient via [employer] o Mail patient assistance application for [place] . Patient self care activities - Over the next 4 weeks, patient will: o Check blood sugar 3-4 times daily, document, and provide at future appointments o Contact provider with any episodes of hypoglycemia o Exercise 15 minutes daily 5 times per week  Heart Failure . Pharmacist Clinical Goal(s) o Over the next 28, patient will work with PharmD and providers to prevent weight gain and fluid accumulation . Current regimen:  o Carvedilol '25mg'$  twice daily with a meal o Spironolactone '25mg'$  twice daily o Hydralazine '100mg'$  three times daily o Torsemide '20mg'$  on Monday, Wednesday, and Friday o Entresto 97-'103mg'$  twice daily o Metolazone 2.'5mg'$  as needed for weight gain of 5lbs within 3 days as directed . Interventions o Recommended to assess fluid status daily o Mail patient assistance application for Entresto . Patient self care activities - Over the next 90 days, patient will: o Weigh daily, record, and provide at future appointments  Medication management . Pharmacist Clinical Goal(s): o Over the next 90 days, patient will work with PharmD and providers to maintain optimal medication adherence . Current pharmacy: [facility] . Interventions o Comprehensive medication review performed. o Utilize [facility] for medication synchronization, packaging and delivery . Patient self care activities - Over the next 4 weeks, patient will: o Focus on medication adherence by transitioning to adherence packaging and medication synchronization o Take medications as prescribed o Report any questions or concerns to PharmD and/or provider(s)  Please see past updates related to this goal by clicking on the "Past Updates" button in the selected goal         Asthma   Eosinophil count:   Lab Results  Component Value Date/Time   EOSPCT 0 11/22/2019 02:12 AM   EOSPCT 1.2 06/03/2007 11:14 AM  %                               Eos  (Absolute):  Lab Results  Component Value Date/Time   EOSABS 0.1 12/02/2019 05:58 PM   Tobacco Status:  Social History   Tobacco Use  Smoking Status Never Smoker  Smokeless Tobacco Never Used   Patient has failed these meds in past: Breo Patient is currently uncontrolled on the following medications:  -Albuterol 1.'25mg'$ /55m nebulizer solution every 6 hours as needed -Montelukast '10mg'$  daily -Proair 2 puffs daily as needed for shortness of breath  Using maintenance inhaler regularly? No Frequency of rescue inhaler use:  3-4x per week  We discussed:  -Pt reports asthma symptoms have worsened most likely due to allergies. When she goes outside she has the following symptoms: watery eyes, sneezing, wheezing -She is  not currently on any antihistamine allergy medications -Pt states she was taken off of Breo by the cardiologist  Plan -Continue current medications  -Contact pulmonologist about getting a new medication. Pt cannot be seen for 3 months  Diabetes   Recent Relevant Labs: Lab Results  Component Value Date/Time   HGBA1C 6.3 (H) 12/02/2019 06:08 PM   HGBA1C 6.4 (H) 06/18/2019 12:51 PM   MICROALBUR 10 08/24/2019 06:06 PM    Dexcom G6 BG monitor  Checking BG: More than 3 times daily when using Dexcom CGM; has not been able to check lately due to running out of Dexcom supplies  Recent BG average readings: 100s-130s Recent FBG Readings:  Recent 2hr PP BG readings:   Recent middle of the night readings:  Patient has failed these meds in past: Januvia, Tresiba, Onglyza, [place], Invokana, Farxiga Patient is currently controlled on the following medications:  -Metformin '500mg'$  with morning and evening meal -Novolog Flexpen up to 17 units three times daily as directed per sliding scale -Tresiba 15 units daily  Last diabetic Foot exam: Unknown Last diabetic Eye exam: Current per OV notes, no date available  We discussed: -Pt received delivery of Dexcom supplies since our  last appointment -Pt reports BG readings at night have improved -She has not had to use Novolog as much -Tyler Aas is expensive  Plan -Continue current medications  -Mail patient assistance application for Tresiba  Hyperlipidemia   Lipid Panel     Component Value Date/Time   CHOL 162 12/02/2019 1808   TRIG 122 12/02/2019 1808   HDL 52 12/02/2019 1808   CHOLHDL 3.1 12/02/2019 1808   [place] 88 12/02/2019 1808   LABVLDL 22 12/02/2019 1808     The 10-year ASCVD risk score Mikey Bussing DC Jr., et al., 2013) is: 2.3%   Values used to calculate the score:     Age: 25 years     Sex: Female     Is Non-Hispanic African American: Yes     Diabetic: Yes     Tobacco smoker: No     Systolic Blood Pressure: 94 mmHg     Is BP treated: Yes     HDL Cholesterol: 52 mg/dL     Total Cholesterol: 162 mg/dL   Patient has failed these meds in past: N/A Patient is currently controlled on the following medications:  -Atorvastatin '10mg'$  daily  -Aspirin 81 mg 2 tablets daily  Plan -Continue current medications  Heart Failure   Type: Systolic  Last ejection fraction: LVEF 45-50% on 04/19/20, 40-45% on 02/12/19  Potassium 3.2 on 04/19/20  Patient has failed these meds in past: Isosorbide mononitrate, Bidil Patient is currently controlled on the following medications:  -Hydralazine '100mg'$  three times daily -Metolazone 2.'5mg'$  as needed for weight gain of 5lbs within 3 days as directed -Potassium SA 34mq daily -Entresto 97-'103mg'$  twice daily -Carvedilol '25mg'$  twice daily with a meal -Spironolactone '25mg'$  twice daily -Torsemide '20mg'$  on Monday, Wednesday, and Friday  We discussed: -weighing daily; if you gain more than 3 pounds in one day or 5 pounds in one week call your doctor  -Pt reports weighing every morning and has notice a little fluid accumulation since taking prednisone -EDelene Lollis expensive  Plan -Continue current medications  -Mail patient assistance application for Entresto     Hypertension   Office blood pressures are  BP Readings from Last 3 Encounters:  06/05/20 (!) 94/55  04/19/20 (!) 142/88  04/12/20 118/74   Patient has failed these meds in the past: Valsartan,  telmisartan, losartan, lisinopril, lisinopril/HCTZ Patient is currently usually controlled on the following medications:  -See Heart Failure  Patient checks BP at home daily  Patient home BP readings are ranging: 122/78  We discussed: -Pt states BP has been perfect recently  Plan -Continue current medications    Systemic Lupus Erythematosus and Rheumatoid Arthritis   Patient is currently on the following medications:  -Hydroxychloroquine '200mg'$  twice daily -Methotrexate 2.'5mg'$  8 tablets ('20mg'$ ) on Fridays -Folic acid '1mg'$  daily -Actemra infusions -Meloxicam '15mg'$  daily as needed -Diclofenac 1% gel as needed  Plan -Continue current medications as prescribed by Rheumatologist   Vitamin D Deficiency   Vitamin D 04/12/20: 43.2ng/mL  Patient is currently controlled on the following medications:  -Ergocalciferol 50,000 units twice weekly  Plan -Continue current medications   Sleep   Patient is currently controlled on the following medications: -Trazodone '50mg'$  at bedtime  Plan -Continue current medications  Allergies   Patient is currently controlled on the following medications:  -Azelastine 159mg 2 sprays into the nose daily -Ipratropium 0.03% 2 sprays in each nostril three times daily as needed  Plan -Continue current medications   Over the Counter Medications   Patient is currently on the following medications: -Probiotic daily -Magnesium '400mg'$  daily -Multivitamin daily  Plan -Continue current medications  Vaccines   Immunization History  Administered Date(s) Administered  . Influenza, High Dose Seasonal PF 09/02/2018  . Influenza, Seasonal, Injecte, Preservative Fre 11/06/2017  . Influenza,inj,Quad PF,6+ Mos 11/06/2017, 08/24/2019  .  Influenza-Unspecified 08/08/2017  . Tdap 08/24/2019   Plan -Will review vaccine history and discuss at follow up  Medication Management   Pt uses [facility] for all medications  We discussed: -Pt reported medication delivery has been going well  Plan -Utilize [facility] pharmacy for medication synchronization, packaging and delivery  Follow up: 6 weeks phone visit  CJannette Fogo PharmD Clinical Pharmacist [facility] [phone]

## 2020-05-25 ENCOUNTER — Telehealth: Payer: Self-pay

## 2020-05-26 ENCOUNTER — Telehealth: Payer: Self-pay

## 2020-05-26 NOTE — Progress Notes (Signed)
EPIC Encounter for ICM Monitoring  Patient Name: Christine Mcdowell is a 49 y.o. female Date: 05/26/2020 Primary Care Physican: [name], MD Primary Cardiologist:Bensimhon Electrophysiologist:Taylor LastWeight:184lbs  Attempted call to patient and unable to reach.  Left message to return call.  Transmission reviewed.    6/2/2021HeartLogic Heart Failure Indexdecreased from 25 to 20 but still suggesting possible fluid accumulation since 05/12/2020.   Prescribed:  Torsemide20 mg take1tablet (20 mg total)Mon, Wed and Friday.  Potassium 20 mEq take 2 tablets daily.  Spironolactone 25 mg take 1 tablet daily.  Metolazone 2.5 mg takeone tablet as needed for weight gain of 5 lbs within 3 days.   Labs: 05/01/2020 Creatinine 0.81, BUN 14, Potassium 3.3, Sodium 143, GFR >60 04/19/2020 Creatinine 0.81, BUN 11, Potassium 3.2, Sodium 145, GFR >60  01/31/2020 Creatinine 0.88, BUN 13, Potassium 3.4, Sodium 141, GFR >60  A complete set of results can be found in Results Review.  Recommendations: Unable to reach.    Follow-up plan: ICM clinic phone appointment on6/14/2021 to recheck fluid levels. 91 day device clinic remote transmission6/08/2020. Office visit with Dr Haroldine Laws on8/30/2021.  Copy of ICM check sent to Dr.Taylor and Dr Haroldine Laws  [place]    [place], RN 05/26/2020 11:21 AM

## 2020-05-26 NOTE — Telephone Encounter (Signed)
Remote ICM transmission received.  Attempted call to patient regarding ICM remote transmission and no answer.  

## 2020-05-29 DIAGNOSIS — Z4889 Encounter for other specified surgical aftercare: Secondary | ICD-10-CM | POA: Diagnosis not present

## 2020-05-29 DIAGNOSIS — Z9889 Other specified postprocedural states: Secondary | ICD-10-CM | POA: Diagnosis not present

## 2020-05-29 DIAGNOSIS — Z4789 Encounter for other orthopedic aftercare: Secondary | ICD-10-CM | POA: Diagnosis not present

## 2020-05-29 DIAGNOSIS — M2012 Hallux valgus (acquired), left foot: Secondary | ICD-10-CM | POA: Diagnosis not present

## 2020-05-30 ENCOUNTER — Other Ambulatory Visit: Payer: Self-pay

## 2020-05-30 MED ORDER — METFORMIN HCL 500 MG PO TABS: ORAL_TABLET | ORAL | 1 refills | Status: DC

## 2020-05-30 MED ORDER — MONTELUKAST SODIUM 10 MG PO TABS: 10.0000 mg | ORAL_TABLET | Freq: Every day | ORAL | 1 refills | Status: DC

## 2020-05-31 ENCOUNTER — Telehealth: Payer: Self-pay | Admitting: Emergency Medicine

## 2020-05-31 ENCOUNTER — Ambulatory Visit (INDEPENDENT_AMBULATORY_CARE_PROVIDER_SITE_OTHER): Payer: Medicare Other | Admitting: *Deleted

## 2020-05-31 DIAGNOSIS — I5022 Chronic systolic (congestive) heart failure: Secondary | ICD-10-CM

## 2020-05-31 DIAGNOSIS — I420 Dilated cardiomyopathy: Secondary | ICD-10-CM | POA: Diagnosis not present

## 2020-05-31 LAB — CUP PACEART REMOTE DEVICE CHECK
Battery Remaining Longevity: 168 mo
Battery Remaining Percentage: 100 %
Brady Statistic RV Percent Paced: 0 %
Date Time Interrogation Session: 20210609045100
HighPow Impedance: 73 Ohm
Implantable Lead Implant Date: 20190206
Implantable Lead Location: 753860
Implantable Lead Model: 292
Implantable Lead Serial Number: 438194
Implantable Pulse Generator Implant Date: 20190206
Lead Channel Impedance Value: 570 Ohm
Lead Channel Setting Pacing Amplitude: 2.5 V
Lead Channel Setting Pacing Pulse Width: 0.4 ms
Lead Channel Setting Sensing Sensitivity: 0.5 mV
Pulse Gen Serial Number: 243572

## 2020-05-31 NOTE — Telephone Encounter (Signed)
Patient made aware that overdue for f/u with Dr Lovena Le and that she will be receiving call from scheduling to set up date. She reports increase tiredness and increased fluid levels over the past few weeks. Patient is being followed by Sharman Cheek for CHF s/sx.

## 2020-06-01 NOTE — Progress Notes (Signed)
Remote ICD transmission.   

## 2020-06-02 ENCOUNTER — Other Ambulatory Visit: Payer: Self-pay | Admitting: Nurse Practitioner

## 2020-06-05 ENCOUNTER — Emergency Department (HOSPITAL_COMMUNITY)
Admission: EM | Admit: 2020-06-05 | Discharge: 2020-06-06 | Disposition: A | Discharge status: Home / Self Care | Payer: Medicare Other | Attending: Emergency Medicine | Admitting: Emergency Medicine

## 2020-06-05 ENCOUNTER — Telehealth (HOSPITAL_COMMUNITY): Payer: Self-pay | Admitting: Cardiology

## 2020-06-05 ENCOUNTER — Ambulatory Visit (INDEPENDENT_AMBULATORY_CARE_PROVIDER_SITE_OTHER): Payer: Medicare Other

## 2020-06-05 ENCOUNTER — Encounter (HOSPITAL_COMMUNITY): Payer: Self-pay | Admitting: Emergency Medicine

## 2020-06-05 ENCOUNTER — Emergency Department (HOSPITAL_COMMUNITY): Payer: Medicare Other

## 2020-06-05 ENCOUNTER — Other Ambulatory Visit: Payer: Self-pay

## 2020-06-05 DIAGNOSIS — R42 Dizziness and giddiness: Secondary | ICD-10-CM | POA: Diagnosis not present

## 2020-06-05 DIAGNOSIS — Z95810 Presence of automatic (implantable) cardiac defibrillator: Secondary | ICD-10-CM | POA: Diagnosis not present

## 2020-06-05 DIAGNOSIS — R52 Pain, unspecified: Secondary | ICD-10-CM | POA: Diagnosis not present

## 2020-06-05 DIAGNOSIS — Z5321 Procedure and treatment not carried out due to patient leaving prior to being seen by health care provider: Secondary | ICD-10-CM | POA: Diagnosis not present

## 2020-06-05 DIAGNOSIS — R0602 Shortness of breath: Secondary | ICD-10-CM | POA: Insufficient documentation

## 2020-06-05 DIAGNOSIS — Z743 Need for continuous supervision: Secondary | ICD-10-CM | POA: Diagnosis not present

## 2020-06-05 DIAGNOSIS — I5022 Chronic systolic (congestive) heart failure: Secondary | ICD-10-CM | POA: Diagnosis not present

## 2020-06-05 DIAGNOSIS — J984 Other disorders of lung: Secondary | ICD-10-CM | POA: Diagnosis not present

## 2020-06-05 DIAGNOSIS — R0902 Hypoxemia: Secondary | ICD-10-CM | POA: Diagnosis not present

## 2020-06-05 DIAGNOSIS — R0789 Other chest pain: Secondary | ICD-10-CM | POA: Insufficient documentation

## 2020-06-05 DIAGNOSIS — R079 Chest pain, unspecified: Secondary | ICD-10-CM | POA: Diagnosis not present

## 2020-06-05 LAB — BASIC METABOLIC PANEL
Anion gap: 16 — ABNORMAL HIGH (ref 5–15)
BUN: 28 mg/dL — ABNORMAL HIGH (ref 6–20)
CO2: 24 mmol/L (ref 22–32)
Calcium: 10 mg/dL (ref 8.9–10.3)
Chloride: 99 mmol/L (ref 98–111)
Creatinine, Ser: 1.93 mg/dL — ABNORMAL HIGH (ref 0.44–1.00)
GFR calc Af Amer: 34 mL/min — ABNORMAL LOW (ref >60)
GFR calc non Af Amer: 29 mL/min — ABNORMAL LOW (ref >60)
Glucose, Bld: 106 mg/dL — ABNORMAL HIGH (ref 70–99)
Potassium: 3.7 mmol/L (ref 3.5–5.1)
Sodium: 139 mmol/L (ref 135–145)

## 2020-06-05 LAB — CBC
HCT: 37.4 % (ref 36.0–46.0)
Hemoglobin: 12.2 g/dL (ref 12.0–15.0)
MCH: 26.7 pg (ref 26.0–34.0)
MCHC: 32.6 g/dL (ref 30.0–36.0)
MCV: 81.8 fL (ref 80.0–100.0)
Platelets: 251 10*3/uL (ref 150–400)
RBC: 4.57 MIL/uL (ref 3.87–5.11)
RDW: 14 % (ref 11.5–15.5)
WBC: 7.4 10*3/uL (ref 4.0–10.5)
nRBC: 0 % (ref 0.0–0.2)

## 2020-06-05 LAB — TROPONIN I (HIGH SENSITIVITY): Troponin I (High Sensitivity): <2 ng/L (ref <18)

## 2020-06-05 MED ORDER — SODIUM CHLORIDE 0.9% FLUSH: 3.0000 mL | Freq: Once | INTRAVENOUS | Status: DC

## 2020-06-05 NOTE — Telephone Encounter (Signed)
Patient left VM on triage line with complaints of increased SOB arm numbness and mild CP   Returned call to patient and she was in the ER being evaluated

## 2020-06-05 NOTE — ED Notes (Signed)
Pt stated she no longer wanted to wait to be seen tonight and would see PCP tomorrow.

## 2020-06-05 NOTE — ED Triage Notes (Signed)
Pt BIB [facility] from home, c/o shortness of breath that started Friday and chest pain that developed this morning. Hx CHF, Lupus, Diabetes, reports she has had a small weight gain.

## 2020-06-06 ENCOUNTER — Telehealth: Payer: Self-pay

## 2020-06-06 ENCOUNTER — Ambulatory Visit: Payer: Self-pay

## 2020-06-06 DIAGNOSIS — I5022 Chronic systolic (congestive) heart failure: Secondary | ICD-10-CM

## 2020-06-06 NOTE — Telephone Encounter (Signed)
Remote ICM transmission received.  Attempted call to patient regarding ICM remote transmission and no answer.  

## 2020-06-06 NOTE — Telephone Encounter (Signed)
Pt left vm stating she went to the emergency room for shortness of breath and hand tingling.Pulse ox at home yesterday was 65% EMS was called and they gave her oxygen her O2 saturation was 100% on supplemental O2. Pt was told by EMS her right lung sounded clear but they could not hear her left lung and advised she be evaluated in the ED. Pt said she waited 6+ hours and was it could take another 9-12 hours before doctor saw her so pt decided to leave.  Pt said she feels better than she did yesterday but still concerned and wants to know if she should make an appointment. Pt took a nebulizer treatment last night and her breathing is better. Pt said she feels full of fluid.  Notices swelling in abdomen no swelling in feet. Pts weight is stable. Pt denies chest pain.    Routed to [place] for advice

## 2020-06-06 NOTE — Progress Notes (Signed)
EPIC Encounter for ICM Monitoring  Patient Name: Christine Mcdowell is a 49 y.o. female Date: 06/06/2020 Primary Care Physican: [name], MD Primary Cardiologist:Bensimhon Electrophysiologist:Taylor LastWeight:184lbs  Attempted call to patient and unable to reach. Left message to return call.  Transmission reviewed.  6/13/2021[employer] Heart Failure Index7 suggesting within normal threshold.   Prescribed:  Torsemide20 mg take1tablet (20 mg total)Mon, Wed and Friday.  Potassium 20 mEq take2tabletsdaily.  Spironolactone 25 mg take 1 tablet daily.  Metolazone 2.5 mg takeone tablet as needed for weight gain of 5 lbs within 3 days.   Labs: 05/01/2020 Creatinine0.81, BUN14, Potassium3.3, Sodium143, GFR>60 04/19/2020 Creatinine0.81, BUN11, Potassium3.2, OERQSX282, GFR>60  01/31/2020 Creatinine0.88, BUN13, Potassium3.4, Sodium141, GFR>60 A complete set of results can be found in Results Review.  Recommendations: Unable to reach.    Follow-up plan: ICM clinic phone appointment on7/19/2021. 91 day device clinic remote transmission9/07/2020. Office visit with Dr Lovena Le 07/20/2020 and Dr Haroldine Laws on8/30/2021.  Copy of ICM check sent to Dr.Taylorand Dr Karalee Height patient was in ER on 6/14 with SOB.   3 Month Trend    8 Day Data Trend          [name], RN 06/06/2020 5:02 PM

## 2020-06-06 NOTE — Chronic Care Management (AMB) (Signed)
Chronic Care Management    Social Work Follow Up Note  06/06/2020 Name: Berenice Oehlert MRN: [id number] DOB: 03-10-1971  Kiylah Loyer is a 49 y.o. year old female who is a primary care patient of [name], MD. The CCM team was consulted for assistance with care coordination.   Review of patient status, including review of consultants reports, other relevant assessments, and collaboration with appropriate care team members and the patient's provider was performed as part of comprehensive patient evaluation and provision of chronic care management services.    SDOH (Social Determinants of Health) assessments performed: No    Outpatient Encounter Medications as of 06/06/2020  Medication Sig  . albuterol (ACCUNEB) 1.25 MG/3ML nebulizer solution Take 3 mLs (1.25 mg total) by nebulization every 6 (six) hours as needed for wheezing.  Marland Kitchen aspirin 81 MG chewable tablet Chew 162 mg by mouth daily. Taking 2 tablets  . atorvastatin (LIPITOR) 10 MG tablet TAKE 1 TABLET(10 MG) BY MOUTH DAILY  . Azelastine HCl 137 MCG/SPRAY SOLN Place 2 sprays into the nose daily.  . Bacillus Coagulans-Inulin (PROBIOTIC) 1-250 BILLION-MG CAPS Take 1 capsule by mouth daily.  . blood glucose meter kit and supplies KIT Dispense based on patient and insurance preference. Use to check blood sugars daily E11.9  . carvedilol (COREG) 25 MG tablet TAKE 1 TABLET(25 MG) BY MOUTH TWICE DAILY WITH A MEAL  . Continuous Blood Gluc Receiver (DEXCOM G6 RECEIVER) DEVI 1 Device by Does not apply route 3 (three) times daily before meals.  . Continuous Blood Gluc Sensor (DEXCOM G6 SENSOR) MISC Inject 1 each into the skin 3 (three) times daily.  . Continuous Blood Gluc Transmit (DEXCOM G6 TRANSMITTER) MISC 1 Device by Does not apply route 3 (three) times daily before meals.  . diclofenac Sodium (VOLTAREN) 1 % GEL APPLY SMALL AMOUNT TO INVOLVED JOINTS UP TO TWICE DAILY  . folic acid (FOLVITE) 1 MG tablet TAKE 1 TABLET BY MOUTH EVERY  MORNING  . hydrALAZINE (APRESOLINE) 100 MG tablet Take 1 tablet (100 mg total) by mouth 3 (three) times daily.  . hydroxychloroquine (PLAQUENIL) 200 MG tablet Take 1 tablet (200 mg total) by mouth 2 (two) times daily.  . insulin lispro (HUMALOG) 100 UNIT/ML injection Inject 0.17 mLs (17 Units total) into the skin 3 (three) times daily with meals. Inject 17u three times a day  . ipratropium (ATROVENT) 0.03 % nasal spray USE 2 SPRAYS IN EACH NOSTRIL THREE TIMES DAILY AS NEEDED FOR RHINITIS  . Magnesium 400 MG TABS Take 1 tablet by mouth daily.   . meloxicam (MOBIC) 15 MG tablet Take 15 mg by mouth daily.   . metFORMIN (GLUCOPHAGE) 500 MG tablet TAKE 1 TABLET BY MOUTH EVERY DAY WITH THE MORNING AND EVENING MEAL  . methotrexate (RHEUMATREX) 2.5 MG tablet Take 20 mg by mouth every Friday.   . metolazone (ZAROXOLYN) 2.5 MG tablet TAKE 1 TABLET BY MOUTH AS NEEDED FOR WEIGHT GAIN OF 5 POUNDS WITHIN 3 DAYS AS DIRECTED  . montelukast (SINGULAIR) 10 MG tablet TAKE 1 TABLET BY MOUTH DAILY  . Multiple Vitamin (MULTIVITAMIN WITH MINERALS) TABS Take 1 tablet by mouth every morning.   . ondansetron (ZOFRAN) 4 MG tablet Take 1 tablet (4 mg total) by mouth every 8 (eight) hours as needed for nausea or vomiting.  . potassium chloride SA (KLOR-CON) 20 MEQ tablet Take 2 tablets (40 mEq total) by mouth daily.  . predniSONE (DELTASONE) 10 MG tablet Take 2 tabs daily x 5 days, then 1.5 tabs  daily x 5 days, then 1 tab daily x 5 days, then 0.5 tabs daily x 5 days  . PROAIR HFA 108 (90 Base) MCG/ACT inhaler INHALE 2 PUFFS BY MOUTH EVERY DAY AS NEEDED FOR SHORTNESS OF BREATH  . sacubitril-valsartan (ENTRESTO) 97-103 MG Take 1 tablet by mouth 2 (two) times daily.  Marland Kitchen spironolactone (ALDACTONE) 25 MG tablet TAKE 1 TABLET(25 MG) BY MOUTH TWICE DAILY  . torsemide (DEMADEX) 20 MG tablet Take 1 tablet Monday, Wednesday , and Friday  . traMADol (ULTRAM) 50 MG tablet Take 1 tablet (50 mg total) by mouth every 6 (six) hours as needed.  (Patient not taking: Reported on 05/01/2020)  . traZODone (DESYREL) 50 MG tablet Take 50 mg by mouth at bedtime.  Tyler Aas FLEXTOUCH 100 UNIT/ML FlexTouch Pen Inject 14 Units into the skin daily.   . Vitamin D, Ergocalciferol, (DRISDOL) 1.25 MG (50000 UT) CAPS capsule TAKE 1 CAPSULE BY MOUTH 2 TIMES EVERY WEEK   No facility-administered encounter medications on file as of 06/06/2020.     Goals Addressed            This Visit's Progress   . Collaborate with RN Case Manager and PharmD to assist with care coordination needs in response to recent ED visit       [place] (see longitudinal plan of care for additional care plan information)  Current Barriers:  . Care management and care coordination needs related to CHF which resulted in emergency department visit  Social Work Clinical Goal(s):  Marland Kitchen Over the next 30 days, patient will collaborate with care management team to assist with care coordination needs in response to recent emergency department visit Goal Met . New 06/06/20 Over the next week the patient will follow up with cardiologist to address ongoing concerns of SOB and chest pain as directed by SW . New 06/06/20 Over the next 90 days the patient will follow up with primary care team to address ongoing care management and care coordination needs   CCM SW Interventions: Completed 06/06/20 . Inter-disciplinary care team collaboration (see longitudinal plan of care) . Performed chart review to note patient seen in ED 06/05/20 for SOB and chest pain; patient left AMA . Successful outbound call placed to the patient to discuss recent ED visit and assess for ongoing acute care coordination needs o Patient reports she left the ED after 4 hours due to long wait times . Determined the patient has recently been instructed by the physician who manges her Lupus that the patient may have a circulation issue due to reports of the patient waking in the morning with cold and stiff hands o The  patient reports she chose to go to the ED due to that concern on top of ongoing SOB and chest pain . Assessed for ongoing symptoms which led to recent ED visit o The patient continues to experience SOB and chest pain . Discussed opportunity to follow up with cardiologist and/or primary care provider o The patient would like to contact Cardiologist regarding symptoms due to concerns with circulatory system on top of SOB and chest pain . Advised the patient to contact SW directly or primary care provider as needed . Collaboration with [name], FNP as well as embedded care management team regarding patient recent ED visit and plan to follow up with Cardiology  Patient Self Care Activities:  . Self administers medications as prescribed . Attends all scheduled provider appointments . Calls pharmacy for medication refills . Performs ADL's independently .  Calls provider office for new concerns or questions  Please see past updates related to this goal by clicking on the "Past Updates" button in the selected goal         Follow Up Plan: SW will follow up with patient by phone over the next two weeks.   Daneen Schick, BSW, CDP Social Worker, Certified Dementia Practitioner [place] [facility] [place] [facility] [phone]  Total time spent performing care coordination and/or care management activities with the patient by phone or face to face = 17 minutes.

## 2020-06-06 NOTE — Patient Instructions (Signed)
Social Worker Visit Information  Goals we discussed today:  Goals Addressed            This Visit's Progress   . Collaborate with RN Case Manager and PharmD to assist with care coordination needs in response to recent ED visit       [place] (see longitudinal plan of care for additional care plan information)  Current Barriers:  . Care management and care coordination needs related to CHF which resulted in emergency department visit  Social Work Clinical Goal(s):  Marland Kitchen Over the next 30 days, patient will collaborate with care management team to assist with care coordination needs in response to recent emergency department visit Goal Met . New 06/06/20 Over the next week the patient will follow up with cardiologist to address ongoing concerns of SOB and chest pain as directed by SW . New 06/06/20 Over the next 90 days the patient will follow up with primary care team to address ongoing care management and care coordination needs   CCM SW Interventions: Completed 06/06/20 . Inter-disciplinary care team collaboration (see longitudinal plan of care) . Performed chart review to note patient seen in ED 06/05/20 for SOB and chest pain; patient left AMA . Successful outbound call placed to the patient to discuss recent ED visit and assess for ongoing acute care coordination needs o Patient reports she left the ED after 4 hours due to long wait times . Determined the patient has recently been instructed by the physician who manges her Lupus that the patient may have a circulation issue due to reports of the patient waking in the morning with cold and stiff hands o The patient reports she chose to go to the ED due to that concern on top of ongoing SOB and chest pain . Assessed for ongoing symptoms which led to recent ED visit o The patient continues to experience SOB and chest pain . Discussed opportunity to follow up with cardiologist and/or primary care provider o The patient would like to  contact Cardiologist regarding symptoms due to concerns with circulatory system on top of SOB and chest pain . Advised the patient to contact SW directly or primary care provider as needed . Collaboration with [name], FNP as well as embedded care management team regarding patient recent ED visit and plan to follow up with Cardiology  Patient Self Care Activities:  . Self administers medications as prescribed . Attends all scheduled provider appointments . Calls pharmacy for medication refills . Performs ADL's independently . Calls provider office for new concerns or questions  Please see past updates related to this goal by clicking on the "Past Updates" button in the selected goal         Follow Up Plan: SW will follow up with patient by phone over the next two weeks,  Daneen Schick, BSW, CDP Social Worker, Certified Dementia Practitioner [place] [facility] [place] [facility] [phone]

## 2020-06-07 DIAGNOSIS — E119 Type 2 diabetes mellitus without complications: Secondary | ICD-10-CM | POA: Diagnosis not present

## 2020-06-07 DIAGNOSIS — Z794 Long term (current) use of insulin: Secondary | ICD-10-CM | POA: Diagnosis not present

## 2020-06-09 DIAGNOSIS — E119 Type 2 diabetes mellitus without complications: Secondary | ICD-10-CM | POA: Diagnosis not present

## 2020-06-09 DIAGNOSIS — Z794 Long term (current) use of insulin: Secondary | ICD-10-CM | POA: Diagnosis not present

## 2020-06-15 HISTORY — PX: BUNIONECTOMY: SHX129

## 2020-06-19 ENCOUNTER — Ambulatory Visit: Payer: Self-pay

## 2020-06-19 DIAGNOSIS — I10 Essential (primary) hypertension: Secondary | ICD-10-CM

## 2020-06-19 DIAGNOSIS — E1169 Type 2 diabetes mellitus with other specified complication: Secondary | ICD-10-CM

## 2020-06-19 DIAGNOSIS — I5022 Chronic systolic (congestive) heart failure: Secondary | ICD-10-CM

## 2020-06-19 NOTE — Chronic Care Management (AMB) (Signed)
Chronic Care Management    Social Work Follow Up Note  06/19/2020 Name: Christine Mcdowell MRN: [id number] DOB: 11-26-1971  Christine Mcdowell is a 49 y.o. year old female who is a primary care patient of [name], MD. The CCM team was consulted for assistance with care coordination.   Review of patient status, including review of consultants reports, other relevant assessments, and collaboration with appropriate care team members and the patient's provider was performed as part of comprehensive patient evaluation and provision of chronic care management services.    SDOH (Social Determinants of Health) assessments performed: Yes SDOH Interventions     Most Recent Value  SDOH Interventions  Food Insecurity Interventions Intervention Not Indicated  [Patient receving boost in FNS due to pandemic- reports buying shelf sustainable food to save and freeze for future needs when benefit stops]  Housing Interventions Intervention Not Indicated  [Patient has overcome past hardship and recently bought a condominium]       Outpatient Encounter Medications as of 06/19/2020  Medication Sig  . albuterol (ACCUNEB) 1.25 MG/3ML nebulizer solution Take 3 mLs (1.25 mg total) by nebulization every 6 (six) hours as needed for wheezing.  Marland Kitchen aspirin 81 MG chewable tablet Chew 162 mg by mouth daily. Taking 2 tablets  . atorvastatin (LIPITOR) 10 MG tablet TAKE 1 TABLET(10 MG) BY MOUTH DAILY  . Azelastine HCl 137 MCG/SPRAY SOLN Place 2 sprays into the nose daily.  . Bacillus Coagulans-Inulin (PROBIOTIC) 1-250 BILLION-MG CAPS Take 1 capsule by mouth daily.  . blood glucose meter kit and supplies KIT Dispense based on patient and insurance preference. Use to check blood sugars daily E11.9  . carvedilol (COREG) 25 MG tablet TAKE 1 TABLET(25 MG) BY MOUTH TWICE DAILY WITH A MEAL  . Continuous Blood Gluc Receiver (DEXCOM G6 RECEIVER) DEVI 1 Device by Does not apply route 3 (three) times daily before meals.  .  Continuous Blood Gluc Sensor (DEXCOM G6 SENSOR) MISC Inject 1 each into the skin 3 (three) times daily.  . Continuous Blood Gluc Transmit (DEXCOM G6 TRANSMITTER) MISC 1 Device by Does not apply route 3 (three) times daily before meals.  . diclofenac Sodium (VOLTAREN) 1 % GEL APPLY SMALL AMOUNT TO INVOLVED JOINTS UP TO TWICE DAILY  . folic acid (FOLVITE) 1 MG tablet TAKE 1 TABLET BY MOUTH EVERY MORNING  . hydrALAZINE (APRESOLINE) 100 MG tablet Take 1 tablet (100 mg total) by mouth 3 (three) times daily.  . hydroxychloroquine (PLAQUENIL) 200 MG tablet Take 1 tablet (200 mg total) by mouth 2 (two) times daily.  . insulin lispro (HUMALOG) 100 UNIT/ML injection Inject 0.17 mLs (17 Units total) into the skin 3 (three) times daily with meals. Inject 17u three times a day  . ipratropium (ATROVENT) 0.03 % nasal spray USE 2 SPRAYS IN EACH NOSTRIL THREE TIMES DAILY AS NEEDED FOR RHINITIS  . Magnesium 400 MG TABS Take 1 tablet by mouth daily.   . meloxicam (MOBIC) 15 MG tablet Take 15 mg by mouth daily.   . metFORMIN (GLUCOPHAGE) 500 MG tablet TAKE 1 TABLET BY MOUTH EVERY DAY WITH THE MORNING AND EVENING MEAL  . methotrexate (RHEUMATREX) 2.5 MG tablet Take 20 mg by mouth every Friday.   . metolazone (ZAROXOLYN) 2.5 MG tablet TAKE 1 TABLET BY MOUTH AS NEEDED FOR WEIGHT GAIN OF 5 POUNDS WITHIN 3 DAYS AS DIRECTED  . montelukast (SINGULAIR) 10 MG tablet TAKE 1 TABLET BY MOUTH DAILY  . Multiple Vitamin (MULTIVITAMIN WITH MINERALS) TABS Take 1 tablet by mouth  every morning.   . ondansetron (ZOFRAN) 4 MG tablet Take 1 tablet (4 mg total) by mouth every 8 (eight) hours as needed for nausea or vomiting.  . potassium chloride SA (KLOR-CON) 20 MEQ tablet Take 2 tablets (40 mEq total) by mouth daily.  . predniSONE (DELTASONE) 10 MG tablet Take 2 tabs daily x 5 days, then 1.5 tabs daily x 5 days, then 1 tab daily x 5 days, then 0.5 tabs daily x 5 days  . PROAIR HFA 108 (90 Base) MCG/ACT inhaler INHALE 2 PUFFS BY MOUTH  EVERY DAY AS NEEDED FOR SHORTNESS OF BREATH  . sacubitril-valsartan (ENTRESTO) 97-103 MG Take 1 tablet by mouth 2 (two) times daily.  Marland Kitchen spironolactone (ALDACTONE) 25 MG tablet TAKE 1 TABLET(25 MG) BY MOUTH TWICE DAILY  . torsemide (DEMADEX) 20 MG tablet Take 1 tablet Monday, Wednesday , and Friday  . traMADol (ULTRAM) 50 MG tablet Take 1 tablet (50 mg total) by mouth every 6 (six) hours as needed. (Patient not taking: Reported on 05/01/2020)  . traZODone (DESYREL) 50 MG tablet Take 50 mg by mouth at bedtime.  Tyler Aas FLEXTOUCH 100 UNIT/ML FlexTouch Pen Inject 14 Units into the skin daily.   . Vitamin D, Ergocalciferol, (DRISDOL) 1.25 MG (50000 UT) CAPS capsule TAKE 1 CAPSULE BY MOUTH 2 TIMES EVERY WEEK   No facility-administered encounter medications on file as of 06/19/2020.     Goals Addressed              This Visit's Progress     Patient Stated   .  COMPLETED: "I need somewhere to stay" (pt-stated)        Current Barriers:  . Financial constraints related to long wait period for disability approval  Clinical Social Work Clinical Goal(s):  Marland Kitchen Over the next 45 days the patient will become more knowledgeable of low income housing options within [place] Interventions: Completed 06/19/20 . Outbound call to the patient to assess progression of patient stated goal . 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 Encouraged the patient to contact SW as needed with future care coordination needs  Patient Self Care Activities:  . Self administers medications as prescribed . Calls pharmacy for medication refills . Calls provider office for new concerns or questions  Please see past updates related to this goal by clicking on the "Past Updates" button in the selected goal      .  COMPLETED: "My food  stamps got cut when my disability was approved" (pt-stated)        Current Barriers:  . Reduction in benefits due to change in income . Lacks knowledge of local food pantry resources  Clinical Social Work Clinical Goal(s):  Marland Kitchen Over the next 30 days the patient will become more knowledgeable of local food and nutrition resources   CCM SW Interventions: Completed 06/19/20 . Successful outbound call placed to the patient to assess for ongoing resource needs related to food insecurity . Determined the patient has been receiving extra FNS benefits due to COVID relief bill o The patient reports she is purchasing canned goods and sustainable food to freeze with extra benefits in order to save food for future needs if/when benefits a lessened . Encouraged the patient to contact SW will future nutritional needs  Patient Self Care Activities:  .  Self administers medications as prescribed . Calls provider office for new concerns or questions  Please see past updates related to this goal by clicking on the "Past Updates" button in the selected goal        Other   .  Collaborate with RN Case Manager and PharmD to assist with care coordination needs in response to recent ED visit   On track     [place] (see longitudinal plan of care for additional care plan information)  Current Barriers:  . Care management and care coordination needs related to CHF which resulted in emergency department visit  Social Work Clinical Goal(s):  Marland Kitchen Over the next 30 days, patient will collaborate with care management team to assist with care coordination needs in response to recent emergency department visit Goal Met . New 06/06/20 Over the next week the patient will follow up with cardiologist to address ongoing concerns of SOB and chest pain as directed by SW . New 06/06/20 Over the next 90 days the patient will follow up with primary care team to address ongoing care management and care coordination needs    CCM SW Interventions: Completed 06/19/20 . Successful outbound call placed to the patient to assess progression of patient stated goal . Determined the patient is feeling better with no concerns of SOB or chest pain at this time . Performed chart review to note upcoming RN Care Manager call scheduled for 06/20/20 . Scheduled follow up SW call over the next 6 weeks; encouraged the patient to contact SW as needed prior to next scheduled call Completed 06/06/20 . Inter-disciplinary care team collaboration (see longitudinal plan of care) . Performed chart review to note patient seen in ED 06/05/20 for SOB and chest pain; patient left AMA . Successful outbound call placed to the patient to discuss recent ED visit and assess for ongoing acute care coordination needs o Patient reports she left the ED after 4 hours due to long wait times . Determined the patient has recently been instructed by the physician who manges her Lupus that the patient may have a circulation issue due to reports of the patient waking in the morning with cold and stiff hands o The patient reports she chose to go to the ED due to that concern on top of ongoing SOB and chest pain . Assessed for ongoing symptoms which led to recent ED visit o The patient continues to experience SOB and chest pain . Discussed opportunity to follow up with cardiologist and/or primary care provider o The patient would like to contact Cardiologist regarding symptoms due to concerns with circulatory system on top of SOB and chest pain . Advised the patient to contact SW directly or primary care provider as needed . Collaboration with [name], FNP as well as embedded care management team regarding patient recent ED visit and plan to follow up with Cardiology  Patient Self Care Activities:  . Self administers medications as prescribed . Attends all scheduled provider appointments . Calls pharmacy for medication refills . Performs ADL's independently  . Calls provider office for new concerns or questions  Please see past updates related to this goal by clicking on the "Past Updates" button in the selected goal         Follow Up Plan: SW will follow up with patient by phone over the next 6 weeks.   Daneen Schick, BSW, CDP Social Worker, Certified Dementia Practitioner [place] [facility] [place] [facility] [phone]  Total time spent performing care  coordination and/or care management activities with the patient by phone or face to face = 15 minutes.

## 2020-06-19 NOTE — Patient Instructions (Signed)
Social Worker Visit Information  Goals we discussed today:  Goals Addressed              This Visit's Progress     Patient Stated   .  COMPLETED: "I need somewhere to stay" (pt-stated)        Current Barriers:  . Financial constraints related to long wait period for disability approval  Clinical Social Work Clinical Goal(s):  Marland Kitchen Over the next 45 days the patient will become more knowledgeable of low income housing options within [place] Interventions: Completed 06/19/20 . Outbound call to the patient to assess progression of patient stated goal . 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 Encouraged the patient to contact SW as needed with future care coordination needs  Patient Self Care Activities:  . Self administers medications as prescribed . Calls pharmacy for medication refills . Calls provider office for new concerns or questions  Please see past updates related to this goal by clicking on the "Past Updates" button in the selected goal      .  COMPLETED: "My food stamps got cut when my disability was approved" (pt-stated)        Current Barriers:  . Reduction in benefits due to change in income . Lacks knowledge of local food pantry resources  Clinical Social Work Clinical Goal(s):  Marland Kitchen Over the next 30 days the patient will become more knowledgeable of local food and nutrition resources   CCM SW Interventions: Completed 06/19/20 . Successful outbound call placed to the patient to assess for ongoing resource needs related to food insecurity . Determined the patient has been receiving extra FNS benefits due to COVID relief bill o The patient reports she is purchasing canned goods and sustainable food to freeze with extra benefits in order to save food for future needs if/when  benefits a lessened . Encouraged the patient to contact SW will future nutritional needs  Patient Self Care Activities:  . Self administers medications as prescribed . Calls provider office for new concerns or questions  Please see past updates related to this goal by clicking on the "Past Updates" button in the selected goal        Other   .  Collaborate with RN Case Manager and PharmD to assist with care coordination needs in response to recent ED visit   On track     [place] (see longitudinal plan of care for additional care plan information)  Current Barriers:  . Care management and care coordination needs related to CHF which resulted in emergency department visit  Social Work Clinical Goal(s):  Marland Kitchen Over the next 30 days, patient will collaborate with care management team to assist with care coordination needs in response to recent emergency department visit Goal Met . New 06/06/20 Over the next week the patient will follow up with cardiologist to address ongoing concerns of SOB and chest pain as directed by SW . New 06/06/20 Over the next 90 days the patient will follow up with primary care team to address ongoing care management and care coordination needs   CCM SW Interventions: Completed 06/19/20 . Successful outbound call placed to the patient to assess progression of patient stated goal . Determined the patient is feeling better with no concerns of SOB or chest pain  at this time . Performed chart review to note upcoming RN Care Manager call scheduled for 06/20/20 . Scheduled follow up SW call over the next 6 weeks; encouraged the patient to contact SW as needed prior to next scheduled call Completed 06/06/20 . Inter-disciplinary care team collaboration (see longitudinal plan of care) . Performed chart review to note patient seen in ED 06/05/20 for SOB and chest pain; patient left AMA . Successful outbound call placed to the patient to discuss recent ED visit and assess for  ongoing acute care coordination needs o Patient reports she left the ED after 4 hours due to long wait times . Determined the patient has recently been instructed by the physician who manges her Lupus that the patient may have a circulation issue due to reports of the patient waking in the morning with cold and stiff hands o The patient reports she chose to go to the ED due to that concern on top of ongoing SOB and chest pain . Assessed for ongoing symptoms which led to recent ED visit o The patient continues to experience SOB and chest pain . Discussed opportunity to follow up with cardiologist and/or primary care provider o The patient would like to contact Cardiologist regarding symptoms due to concerns with circulatory system on top of SOB and chest pain . Advised the patient to contact SW directly or primary care provider as needed . Collaboration with [name], FNP as well as embedded care management team regarding patient recent ED visit and plan to follow up with Cardiology  Patient Self Care Activities:  . Self administers medications as prescribed . Attends all scheduled provider appointments . Calls pharmacy for medication refills . Performs ADL's independently . Calls provider office for new concerns or questions  Please see past updates related to this goal by clicking on the "Past Updates" button in the selected goal         Follow Up Plan: SW will follow up with patient by phone over the next 6 weeks.   Daneen Schick, BSW, CDP Social Worker, Certified Dementia Practitioner [place] [facility] [phone]

## 2020-06-20 ENCOUNTER — Ambulatory Visit (INDEPENDENT_AMBULATORY_CARE_PROVIDER_SITE_OTHER): Payer: Medicare Other

## 2020-06-20 ENCOUNTER — Telehealth: Payer: Self-pay

## 2020-06-20 ENCOUNTER — Other Ambulatory Visit: Payer: Self-pay

## 2020-06-20 DIAGNOSIS — I5022 Chronic systolic (congestive) heart failure: Secondary | ICD-10-CM | POA: Diagnosis not present

## 2020-06-20 DIAGNOSIS — E785 Hyperlipidemia, unspecified: Secondary | ICD-10-CM

## 2020-06-20 DIAGNOSIS — E1169 Type 2 diabetes mellitus with other specified complication: Secondary | ICD-10-CM | POA: Diagnosis not present

## 2020-06-20 DIAGNOSIS — I10 Essential (primary) hypertension: Secondary | ICD-10-CM | POA: Diagnosis not present

## 2020-06-21 NOTE — Chronic Care Management (AMB) (Signed)
  Chronic Care Management   Outreach Note  06/21/2020 Name: Christine Mcdowell MRN: [id number] DOB: 06/23/1971  Referred by: [name], MD Reason for referral : Chronic Care Management (FU RN CM Call - DM/CHF/Asthma)   An unsuccessful telephone outreach was attempted today. The patient was referred to the case management team for assistance with care management and care coordination. I spoke to [name] briefly, however she is not available to speak with me at this time. Discussed that [name] will give this RN CM a call back a Damonie Furney later this afternoon.   Follow Up Plan: Telephone follow up appointment with care management team member scheduled for: 07/12/20  Barb Merino, RN, BSN, CCM Care Management Coordinator [place] [facility]  Direct Phone: [phone]

## 2020-06-22 NOTE — Patient Instructions (Signed)
Visit Information  Goals Addressed            This Visit's Progress   . Pharmacy Care Plan       CARE PLAN ENTRY  Current Barriers:  . Chronic Disease Management support, education, and care coordination needs related to Hypertension, Hyperlipidemia, Diabetes, and Heart Failure   Hypertension . Pharmacist Clinical Goal(s): o Over the next 90 days, patient will work with PharmD and providers to maintain BP goal <130/80 . Current regimen:  o Carvedilol 25mg  twice daily with a meal o Spironolactone 25mg  twice daily o Hydralazine 100mg  three times daily . Interventions: o Recommended increase in physical activity  o Recommended heart healthy diet . Patient self care activities - Over the next 4 weeks, patient will: o Check BP daily, document, and provide at future appointments o Ensure daily salt intake < 2300 mg/day o Exercise 15 minutes daily 5 times per week  Hyperlipidemia . Pharmacist Clinical Goal(s): o Over the next 90 days, patient will work with PharmD and providers to maintain LDL goal < 100 . Current regimen:  o Atorvastatin 10mg  daily . Interventions: o Recommend increase in physical activity . Patient self care activities - Over the next 4 weeks, patient will: o Exercise 15 minutes daily 5 times per week o Limit fried foods and red meats  Diabetes . Pharmacist Clinical Goal(s): o Over the next 90 days, patient will work with PharmD and providers to maintain A1c goal <7% . Current regimen:  o Metformin 500mg  with morning and evening meal o Novolog Flexpen up to 17 units three times daily as directed per sliding scale o Tresiba 15 units daily . Interventions: o Verified Dexcom supplies delivered to patient via [employer] o Mail patient assistance application for [place] . Patient self care activities - Over the next 4 weeks, patient will: o Check blood sugar 3-4 times daily, document, and provide at future appointments o Contact provider with any  episodes of hypoglycemia o Exercise 15 minutes daily 5 times per week  Heart Failure . Pharmacist Clinical Goal(s) o Over the next 25, patient will work with PharmD and providers to prevent weight gain and fluid accumulation . Current regimen:  o Carvedilol 25mg  twice daily with a meal o Spironolactone 25mg  twice daily o Hydralazine 100mg  three times daily o Torsemide 20mg  on Monday, Wednesday, and Friday o Entresto 97-103mg  twice daily o Metolazone 2.5mg  as needed for weight gain of 5lbs within 3 days as directed . Interventions o Recommended to assess fluid status daily o Mail patient assistance application for Entresto . Patient self care activities - Over the next 90 days, patient will: o Weigh daily, record, and provide at future appointments  Medication management . Pharmacist Clinical Goal(s): o Over the next 90 days, patient will work with PharmD and providers to maintain optimal medication adherence . Current pharmacy: [facility] . Interventions o Comprehensive medication review performed. o Utilize [facility] for medication synchronization, packaging and delivery . Patient self care activities - Over the next 4 weeks, patient will: o Focus on medication adherence by transitioning to adherence packaging and medication synchronization o Take medications as prescribed o Report any questions or concerns to PharmD and/or provider(s)  Please see past updates related to this goal by clicking on the "Past Updates" button in the selected goal         Patient verbalizes understanding of instructions provided today.   Telephone follow up appointment with pharmacy team member scheduled for: 07/06/20 @ 3:30  PM  Jannette Fogo, PharmD Clinical Pharmacist [facility] [phone]

## 2020-07-04 ENCOUNTER — Telehealth: Payer: Self-pay | Admitting: Internal Medicine

## 2020-07-04 DIAGNOSIS — Z794 Long term (current) use of insulin: Secondary | ICD-10-CM | POA: Diagnosis not present

## 2020-07-04 DIAGNOSIS — Z4789 Encounter for other orthopedic aftercare: Secondary | ICD-10-CM | POA: Diagnosis not present

## 2020-07-04 DIAGNOSIS — M329 Systemic lupus erythematosus, unspecified: Secondary | ICD-10-CM | POA: Diagnosis not present

## 2020-07-04 DIAGNOSIS — D688 Other specified coagulation defects: Secondary | ICD-10-CM | POA: Diagnosis not present

## 2020-07-04 DIAGNOSIS — E1165 Type 2 diabetes mellitus with hyperglycemia: Secondary | ICD-10-CM | POA: Diagnosis not present

## 2020-07-04 DIAGNOSIS — M0579 Rheumatoid arthritis with rheumatoid factor of multiple sites without organ or systems involvement: Secondary | ICD-10-CM | POA: Diagnosis not present

## 2020-07-04 DIAGNOSIS — M19072 Primary osteoarthritis, left ankle and foot: Secondary | ICD-10-CM | POA: Diagnosis not present

## 2020-07-04 DIAGNOSIS — M2012 Hallux valgus (acquired), left foot: Secondary | ICD-10-CM | POA: Diagnosis not present

## 2020-07-04 NOTE — Chronic Care Management (AMB) (Signed)
  Chronic Care Management   Note  07/04/2020 Name: Christine Mcdowell MRN: [id number] DOB: 1971/10/27  Christine Mcdowell is a 49 y.o. year old female who is a primary care patient of [name], MD and is actively engaged with the care management team. I reached out to [name] by phone today to assist with re-scheduling a follow up visit with the Pharmacist.  Follow up plan: Telephone appointment with care management team member scheduled for: 08/02/2020  [place], [place], [place] [address] Direct Dial: [email or web address] Website: [email or web address]

## 2020-07-06 ENCOUNTER — Other Ambulatory Visit: Payer: Self-pay

## 2020-07-06 ENCOUNTER — Telehealth: Payer: Self-pay

## 2020-07-06 DIAGNOSIS — E1169 Type 2 diabetes mellitus with other specified complication: Secondary | ICD-10-CM

## 2020-07-06 MED ORDER — INSULIN LISPRO 100 UNIT/ML ~~LOC~~ SOLN: 17.0000 [IU] | Freq: Three times a day (TID) | SUBCUTANEOUS | 0 refills | Status: DC

## 2020-07-09 DIAGNOSIS — E119 Type 2 diabetes mellitus without complications: Secondary | ICD-10-CM | POA: Diagnosis not present

## 2020-07-09 DIAGNOSIS — Z794 Long term (current) use of insulin: Secondary | ICD-10-CM | POA: Diagnosis not present

## 2020-07-10 ENCOUNTER — Ambulatory Visit (INDEPENDENT_AMBULATORY_CARE_PROVIDER_SITE_OTHER): Payer: Medicare Other

## 2020-07-10 DIAGNOSIS — I5022 Chronic systolic (congestive) heart failure: Secondary | ICD-10-CM

## 2020-07-10 DIAGNOSIS — Z95810 Presence of automatic (implantable) cardiac defibrillator: Secondary | ICD-10-CM | POA: Diagnosis not present

## 2020-07-11 ENCOUNTER — Other Ambulatory Visit: Payer: Self-pay | Admitting: Nurse Practitioner

## 2020-07-11 ENCOUNTER — Other Ambulatory Visit (HOSPITAL_COMMUNITY): Payer: Self-pay | Admitting: Internal Medicine

## 2020-07-12 ENCOUNTER — Telehealth: Payer: Medicare Other

## 2020-07-12 ENCOUNTER — Other Ambulatory Visit: Payer: Self-pay

## 2020-07-12 ENCOUNTER — Ambulatory Visit: Payer: Self-pay

## 2020-07-12 DIAGNOSIS — E1169 Type 2 diabetes mellitus with other specified complication: Secondary | ICD-10-CM

## 2020-07-12 DIAGNOSIS — I10 Essential (primary) hypertension: Secondary | ICD-10-CM

## 2020-07-12 DIAGNOSIS — I5022 Chronic systolic (congestive) heart failure: Secondary | ICD-10-CM

## 2020-07-12 MED ORDER — INSULIN LISPRO 100 UNIT/ML ~~LOC~~ SOLN: 17.0000 [IU] | Freq: Three times a day (TID) | SUBCUTANEOUS | 0 refills | Status: DC

## 2020-07-12 MED ORDER — TRESIBA FLEXTOUCH 100 UNIT/ML ~~LOC~~ SOPN: 14.0000 [IU] | PEN_INJECTOR | Freq: Every day | SUBCUTANEOUS | 0 refills | Status: DC

## 2020-07-12 MED ORDER — VITAMIN D (ERGOCALCIFEROL) 1.25 MG (50000 UNIT) PO CAPS: ORAL_CAPSULE | ORAL | 0 refills | Status: DC

## 2020-07-12 MED ORDER — ATORVASTATIN CALCIUM 10 MG PO TABS: 10.0000 mg | ORAL_TABLET | Freq: Every day | ORAL | 2 refills | Status: DC

## 2020-07-12 NOTE — Progress Notes (Signed)
EPIC Encounter for ICM Monitoring  Patient Name: Christine Mcdowell is a 49 y.o. female Date: 07/12/2020 Primary Care Physican: [name], MD Primary Cardiologist:Bensimhon Electrophysiologist:Taylor LastWeight:184lbs  Transmission reviewed.  7/18/2021[employer] Heart Failure Index0 suggesting within normal threshold.   Prescribed:  Torsemide20 mg take1tablet (20 mg total)Mon, Wed and Friday.  Potassium 20 mEq take2tabletsdaily.  Spironolactone 25 mg take 1 tablet daily.  Metolazone 2.5 mg takeone tablet as needed for weight gain of 5 lbs within 3 days.   Labs: 06/05/2020 Creatinine 1.93, BUN 28, Potassium 3.7, Sodium 139, GFR 29-34 05/01/2020 Creatinine0.81, BUN14, Potassium3.3, Sodium143, GFR>60 04/19/2020 Creatinine0.81, BUN11, Potassium3.2, [id number], GFR>60  01/31/2020 Creatinine0.88, BUN13, Potassium3.4, Sodium141, GFR>60 A complete set of results can be found in Results Review.  Recommendations:None  Follow-up plan: ICM clinic phone appointment on8/23/2021. 91 day device clinic remote transmission9/07/2020.          EP/Cardiology next office visit: 07/20/2020 with Dr. Lovena Le and 08/21/2020 with Dr Haroldine Laws.         Copy of ICM check sent to Dr. Lovena Le.  3 Month Trend    8 Day Data Trend          [name], RN 07/12/2020 2:16 PM

## 2020-07-13 NOTE — Chronic Care Management (AMB) (Signed)
  Chronic Care Management   Outreach Note  07/13/2020 Name: Christine Mcdowell MRN: [id number] DOB: 09/11/1971  Referred by: [name], MD Reason for referral : Chronic Care Management (FU RN CM Call - post ED, swelling, low O2 sat)   An unsuccessful telephone outreach was attempted today. The patient was referred to the case management team for assistance with care management and care coordination.   Follow Up Plan: A HIPPA compliant phone message was left for the patient providing contact information and requesting a return call.  Telephone follow up appointment with care management team member scheduled for: 08/24/20  Barb Merino, RN, BSN, CCM Care Management Coordinator [place] [facility]  Direct Phone: [phone]

## 2020-07-19 ENCOUNTER — Encounter: Payer: Medicare Other | Admitting: Internal Medicine

## 2020-07-20 ENCOUNTER — Ambulatory Visit: Payer: Medicare Other

## 2020-07-20 DIAGNOSIS — I5022 Chronic systolic (congestive) heart failure: Secondary | ICD-10-CM

## 2020-07-20 DIAGNOSIS — I10 Essential (primary) hypertension: Secondary | ICD-10-CM

## 2020-07-20 DIAGNOSIS — E1169 Type 2 diabetes mellitus with other specified complication: Secondary | ICD-10-CM

## 2020-07-20 NOTE — Patient Instructions (Signed)
Social Worker Visit Information  Goals we discussed today:  Goals Addressed              This Visit's Progress     Patient Stated   .  "My utility bill is getting really hard to afford" (pt-stated)        CARE PLAN ENTRY (see longitudinal plan of care for additional care plan information)  Current Barriers:  . Increased utility costs due to patients mother staying with her after recent surgery . Fixed income from monthly disability income . Chronic conditions including CHF, DM II and HTN which put patient at higher risk for hospitalization . Limited knowledge of [facility] resources  Social Work Clinical Goal(s):  Marland Kitchen Over the next 60 days, patient will work with SW to address concerns related to financial assistance needs . Over the next 6 months the patient will follow up with [place] [facility]) as directed by SW  SW Interventions: Completed 07/20/20 . Inter-disciplinary care team collaboration (see longitudinal plan of care) . Patient interviewed and appropriate assessments performed . Determined the patient is experiencing financial hardship as her mother has begun staying with the patient after recent back surgery o The patient reports since her mother has began staying in her home she has fallen behind on her utility payment by $138 o The patient has worked out a Agricultural consultant with [employer] and will be paying her owed balance over the next year by adding an extra $12 to each new bill o The patient also reports her water bill has raised approximately 3 x than normal which is causing financial strain . Educated the patient on resources within [facility] which may have funds to assist with patient utility cost needs o Advised the patient most resources run out of funds before the end of the month o The patient is in agreement to wait until Monday 8/2 for SW to place referrals due to today's date being 7/29 and concern funds  may be exhausted . Provided the patient with information both verbally and via mail on the [facility] ([place] [facility]) which will assist patient with heating costs over the winter o Advised the patient she is eligible to apply for assistance beginning December 1 o Patient stated understanding . Follow up scheduled for 8/2 for SW to place referrals to community resources for financial assistance . Scheduled follow up call to the patient over the next month to confirm receipt of mailed resource  Patient Self Care Activities:  . Patient verbalizes understanding of plan to work with SW to identify financial resources . Self administers medications as prescribed . Attends all scheduled provider appointments . Performs ADL's independently . Performs IADL's independently . Calls provider office for new concerns or questions  Initial goal documentation       Other   .  COMPLETED: Collaborate with RN Case Manager and PharmD to assist with care coordination needs in response to recent ED visit        [place] (see longitudinal plan of care for additional care plan information)  Current Barriers:  . Care management and care coordination needs related to CHF which resulted in emergency department visit  Social Work Clinical Goal(s):  Marland Kitchen Over the next 30 days, patient will collaborate with care management team to assist with care coordination needs in response to recent emergency department visit Goal Met . New 06/06/20 Over the next week the  patient will follow up with cardiologist to address ongoing concerns of SOB and chest pain as directed by SW . New 06/06/20 Over the next 90 days the patient will follow up with primary care team to address ongoing care management and care coordination needs   CCM SW Interventions: Completed 07/20/20 . Successful outbound call placed to the patient to assess goal progression . Determined the patient is not experiencing  any acute challenges at this time . Performed chart review to note follow up appointments as follows: o 8/11 Call with Embedded PharmD o 8/23 Remote Pacemaker check o 8/30 Office Visit with Dr. Haroldine Laws o 9/2 Follow up call with RN Care Manager . Goal Met  Patient Self Care Activities:  . Self administers medications as prescribed . Attends all scheduled provider appointments . Calls pharmacy for medication refills . Performs ADL's independently . Calls provider office for new concerns or questions  Please see past updates related to this goal by clicking on the "Past Updates" button in the selected goal         Materials Provided: Yes: provided verbal and written information on [facility] program.  Follow Up Plan: SW will place financial resource referrals on 8/2 as agreed upon with the patient. SW will follow up with the patient over the next month to confirm receipt of mailed resource.   Daneen Schick, BSW, CDP Social Worker, Certified Dementia Practitioner [place] [facility] [facility] [phone]

## 2020-07-20 NOTE — Chronic Care Management (AMB) (Signed)
Chronic Care Management    Social Work Follow Up Note  07/20/2020 Name: Christine Mcdowell MRN: [id number] DOB: February 20, 1971  Christine Mcdowell is a 49 y.o. year old female who is a primary care patient of [name], MD. The CCM team was consulted for assistance with care coordination.   Review of patient status, including review of consultants reports, other relevant assessments, and collaboration with appropriate care team members and the patient's provider was performed as part of comprehensive patient evaluation and provision of chronic care management services.    SDOH (Social Determinants of Health) assessments performed: Yes, see care plan below. SDOH Interventions     Most Recent Value  SDOH Interventions  Housing Interventions [id number] Referral  [Will refer for utility assistance on 8/2 as most agencies are out of funds by the end of the month]       Outpatient Encounter Medications as of 07/20/2020  Medication Sig  . albuterol (ACCUNEB) 1.25 MG/3ML nebulizer solution Take 3 mLs (1.25 mg total) by nebulization every 6 (six) hours as needed for wheezing.  Marland Kitchen aspirin 81 MG chewable tablet Chew 162 mg by mouth daily. Taking 2 tablets  . atorvastatin (LIPITOR) 10 MG tablet Take 1 tablet (10 mg total) by mouth daily.  . Azelastine HCl 137 MCG/SPRAY SOLN Place 2 sprays into the nose daily.  . Bacillus Coagulans-Inulin (PROBIOTIC) 1-250 BILLION-MG CAPS Take 1 capsule by mouth daily.  . blood glucose meter kit and supplies KIT Dispense based on patient and insurance preference. Use to check blood sugars daily E11.9  . carvedilol (COREG) 25 MG tablet TAKE 1 TABLET(25 MG) BY MOUTH TWICE DAILY WITH A MEAL  . Continuous Blood Gluc Receiver (DEXCOM G6 RECEIVER) DEVI 1 Device by Does not apply route 3 (three) times daily before meals.  . Continuous Blood Gluc Sensor (DEXCOM G6 SENSOR) MISC Inject 1 each into the skin 3 (three) times daily.  . Continuous Blood Gluc Transmit (DEXCOM G6  TRANSMITTER) MISC 1 Device by Does not apply route 3 (three) times daily before meals.  . diclofenac Sodium (VOLTAREN) 1 % GEL APPLY SMALL AMOUNT TO INVOLVED JOINTS UP TO TWICE DAILY  . folic acid (FOLVITE) 1 MG tablet TAKE 1 TABLET BY MOUTH EVERY MORNING  . hydrALAZINE (APRESOLINE) 100 MG tablet TAKE ONE TABLET BY MOUTH EVERY MORNING and TAKE ONE TABLET BY MOUTH EVERY EVENING and TAKE ONE TABLET BY MOUTH EVERYDAY AT BEDTIME  . hydroxychloroquine (PLAQUENIL) 200 MG tablet Take 1 tablet (200 mg total) by mouth 2 (two) times daily.  . insulin lispro (HUMALOG) 100 UNIT/ML injection Inject 0.17 mLs (17 Units total) into the skin 3 (three) times daily with meals. Inject 17u three times a day  . ipratropium (ATROVENT) 0.03 % nasal spray USE 2 SPRAYS IN EACH NOSTRIL THREE TIMES DAILY AS NEEDED FOR RHINITIS  . Magnesium 400 MG TABS Take 1 tablet by mouth daily.   . meloxicam (MOBIC) 15 MG tablet Take 15 mg by mouth daily.   . metFORMIN (GLUCOPHAGE) 500 MG tablet TAKE 1 TABLET BY MOUTH EVERY DAY WITH THE MORNING AND EVENING MEAL  . methotrexate (RHEUMATREX) 2.5 MG tablet Take 20 mg by mouth every Friday.   . metolazone (ZAROXOLYN) 2.5 MG tablet TAKE 1 TABLET BY MOUTH AS NEEDED FOR WEIGHT GAIN OF 5 POUNDS WITHIN 3 DAYS AS DIRECTED  . montelukast (SINGULAIR) 10 MG tablet TAKE 1 TABLET BY MOUTH DAILY  . Multiple Vitamin (MULTIVITAMIN WITH MINERALS) TABS Take 1 tablet by mouth every morning.   Marland Kitchen  ondansetron (ZOFRAN) 4 MG tablet Take 1 tablet (4 mg total) by mouth every 8 (eight) hours as needed for nausea or vomiting.  . potassium chloride SA (KLOR-CON) 20 MEQ tablet Take 2 tablets (40 mEq total) by mouth daily.  . predniSONE (DELTASONE) 10 MG tablet Take 2 tabs daily x 5 days, then 1.5 tabs daily x 5 days, then 1 tab daily x 5 days, then 0.5 tabs daily x 5 days  . PROAIR HFA 108 (90 Base) MCG/ACT inhaler INHALE 2 PUFFS BY MOUTH EVERY DAY AS NEEDED FOR SHORTNESS OF BREATH  . sacubitril-valsartan (ENTRESTO)  97-103 MG Take 1 tablet by mouth 2 (two) times daily.  Marland Kitchen spironolactone (ALDACTONE) 25 MG tablet TAKE 1 TABLET(25 MG) BY MOUTH TWICE DAILY  . torsemide (DEMADEX) 20 MG tablet Take 1 tablet Monday, Wednesday , and Friday  . traMADol (ULTRAM) 50 MG tablet Take 1 tablet (50 mg total) by mouth every 6 (six) hours as needed. (Patient not taking: Reported on 05/01/2020)  . traZODone (DESYREL) 50 MG tablet Take 50 mg by mouth at bedtime.  Tyler Aas FLEXTOUCH 100 UNIT/ML FlexTouch Pen Inject 0.14 mLs (14 Units total) into the skin daily.  . Vitamin D, Ergocalciferol, (DRISDOL) 1.25 MG (50000 UNIT) CAPS capsule TAKE 1 CAPSULE BY MOUTH 2 TIMES EVERY WEEK   No facility-administered encounter medications on file as of 07/20/2020.     Goals Addressed              This Visit's Progress     Patient Stated   .  "My utility bill is getting really hard to afford" (pt-stated)        CARE PLAN ENTRY (see longitudinal plan of care for additional care plan information)  Current Barriers:  . Increased utility costs due to patients mother staying with her after recent surgery . Fixed income from monthly disability income . Chronic conditions including CHF, DM II and HTN which put patient at higher risk for hospitalization . Limited knowledge of [facility] resources  Social Work Clinical Goal(s):  Marland Kitchen Over the next 60 days, patient will work with SW to address concerns related to financial assistance needs . Over the next 6 months the patient will follow up with [place] ([place] [facility]) as directed by SW  SW Interventions: Completed 07/20/20 . Inter-disciplinary care team collaboration (see longitudinal plan of care) . Patient interviewed and appropriate assessments performed . Determined the patient is experiencing financial hardship as her mother has begun staying with the patient after recent back surgery o The patient reports since her mother has began  staying in her home she has fallen behind on her utility payment by $138 o The patient has worked out a Agricultural consultant with [employer] and will be paying her owed balance over the next year by adding an extra $12 to each new bill o The patient also reports her water bill has raised approximately 3 x than normal which is causing financial strain . Educated the patient on resources within [facility] which may have funds to assist with patient utility cost needs o Advised the patient most resources run out of funds before the end of the month o The patient is in agreement to wait until Monday 8/2 for SW to place referrals due to today's date being 7/29 and concern funds may be exhausted . Provided the patient with information both verbally and via mail on the [facility]  which will assist patient with heating costs over the winter o Advised the patient she is eligible to apply for assistance beginning December 1 o Patient stated understanding . Follow up scheduled for 8/2 for SW to place referrals to community resources for financial assistance . Scheduled follow up call to the patient over the next month to confirm receipt of mailed resource  Patient Self Care Activities:  . Patient verbalizes understanding of plan to work with SW to identify financial resources . Self administers medications as prescribed . Attends all scheduled provider appointments . Performs ADL's independently . Performs IADL's independently . Calls provider office for new concerns or questions  Initial goal documentation       Other   .  COMPLETED: Collaborate with RN Case Manager and PharmD to assist with care coordination needs in response to recent ED visit        [place] (see longitudinal plan of care for additional care plan information)  Current Barriers:  . Care management and care coordination needs related to CHF which resulted in emergency  department visit  Social Work Clinical Goal(s):  Marland Kitchen Over the next 30 days, patient will collaborate with care management team to assist with care coordination needs in response to recent emergency department visit Goal Met . New 06/06/20 Over the next week the patient will follow up with cardiologist to address ongoing concerns of SOB and chest pain as directed by SW . New 06/06/20 Over the next 90 days the patient will follow up with primary care team to address ongoing care management and care coordination needs   CCM SW Interventions: Completed 07/20/20 . Successful outbound call placed to the patient to assess goal progression . Determined the patient is not experiencing any acute challenges at this time . Performed chart review to note follow up appointments as follows: o 8/11 Call with Embedded PharmD o 8/23 Remote Pacemaker check o 8/30 Office Visit with Dr. Haroldine Laws o 9/2 Follow up call with RN Care Manager . Goal Met  Patient Self Care Activities:  . Self administers medications as prescribed . Attends all scheduled provider appointments . Calls pharmacy for medication refills . Performs ADL's independently . Calls provider office for new concerns or questions  Please see past updates related to this goal by clicking on the "Past Updates" button in the selected goal         Follow Up Plan: Appointment scheduled for SW to follow up with resource referrals on 07/24/20.   Daneen Schick, BSW, CDP Social Worker, Certified Dementia Practitioner [place] [facility] [place] [facility] [phone]  Total time spent performing care coordination and/or care management activities with the patient by phone or face to face = 12 minutes.

## 2020-07-21 DIAGNOSIS — M0579 Rheumatoid arthritis with rheumatoid factor of multiple sites without organ or systems involvement: Secondary | ICD-10-CM | POA: Diagnosis not present

## 2020-07-24 ENCOUNTER — Ambulatory Visit: Payer: Medicare Other

## 2020-07-24 DIAGNOSIS — E1169 Type 2 diabetes mellitus with other specified complication: Secondary | ICD-10-CM

## 2020-07-24 DIAGNOSIS — I10 Essential (primary) hypertension: Secondary | ICD-10-CM

## 2020-07-24 NOTE — Chronic Care Management (AMB) (Signed)
Chronic Care Management    Social Work Follow Up Note  07/24/2020 Name: Christine Mcdowell MRN: [id number] DOB: 01/01/1971  Christine Mcdowell is a 49 y.o. year old female who is a primary care patient of Sanders, Robyn, MD. The CCM team was consulted for assistance with care coordination.   Review of patient status, including review of consultants reports, other relevant assessments, and collaboration with appropriate care team members and the patient's provider was performed as part of comprehensive patient evaluation and provision of chronic care management services.    SDOH (Social Determinants of Health) assessments performed: No    Outpatient Encounter Medications as of 07/24/2020  Medication Sig  . albuterol (ACCUNEB) 1.25 MG/3ML nebulizer solution Take 3 mLs (1.25 mg total) by nebulization every 6 (six) hours as needed for wheezing.  . aspirin 81 MG chewable tablet Chew 162 mg by mouth daily. Taking 2 tablets  . atorvastatin (LIPITOR) 10 MG tablet Take 1 tablet (10 mg total) by mouth daily.  . Azelastine HCl 137 MCG/SPRAY SOLN Place 2 sprays into the nose daily.  . Bacillus Coagulans-Inulin (PROBIOTIC) 1-250 BILLION-MG CAPS Take 1 capsule by mouth daily.  . blood glucose meter kit and supplies KIT Dispense based on patient and insurance preference. Use to check blood sugars daily E11.9  . carvedilol (COREG) 25 MG tablet TAKE 1 TABLET(25 MG) BY MOUTH TWICE DAILY WITH A MEAL  . Continuous Blood Gluc Receiver (DEXCOM G6 RECEIVER) DEVI 1 Device by Does not apply route 3 (three) times daily before meals.  . Continuous Blood Gluc Sensor (DEXCOM G6 SENSOR) MISC Inject 1 each into the skin 3 (three) times daily.  . Continuous Blood Gluc Transmit (DEXCOM G6 TRANSMITTER) MISC 1 Device by Does not apply route 3 (three) times daily before meals.  . diclofenac Sodium (VOLTAREN) 1 % GEL APPLY SMALL AMOUNT TO INVOLVED JOINTS UP TO TWICE DAILY  . folic acid (FOLVITE) 1 MG tablet TAKE 1 TABLET BY MOUTH  EVERY MORNING  . hydrALAZINE (APRESOLINE) 100 MG tablet TAKE ONE TABLET BY MOUTH EVERY MORNING and TAKE ONE TABLET BY MOUTH EVERY EVENING and TAKE ONE TABLET BY MOUTH EVERYDAY AT BEDTIME  . hydroxychloroquine (PLAQUENIL) 200 MG tablet Take 1 tablet (200 mg total) by mouth 2 (two) times daily.  . insulin lispro (HUMALOG) 100 UNIT/ML injection Inject 0.17 mLs (17 Units total) into the skin 3 (three) times daily with meals. Inject 17u three times a day  . ipratropium (ATROVENT) 0.03 % nasal spray USE 2 SPRAYS IN EACH NOSTRIL THREE TIMES DAILY AS NEEDED FOR RHINITIS  . Magnesium 400 MG TABS Take 1 tablet by mouth daily.   . meloxicam (MOBIC) 15 MG tablet Take 15 mg by mouth daily.   . metFORMIN (GLUCOPHAGE) 500 MG tablet TAKE 1 TABLET BY MOUTH EVERY DAY WITH THE MORNING AND EVENING MEAL  . methotrexate (RHEUMATREX) 2.5 MG tablet Take 20 mg by mouth every Friday.   . metolazone (ZAROXOLYN) 2.5 MG tablet TAKE 1 TABLET BY MOUTH AS NEEDED FOR WEIGHT GAIN OF 5 POUNDS WITHIN 3 DAYS AS DIRECTED  . montelukast (SINGULAIR) 10 MG tablet TAKE 1 TABLET BY MOUTH DAILY  . Multiple Vitamin (MULTIVITAMIN WITH MINERALS) TABS Take 1 tablet by mouth every morning.   . ondansetron (ZOFRAN) 4 MG tablet Take 1 tablet (4 mg total) by mouth every 8 (eight) hours as needed for nausea or vomiting.  . potassium chloride SA (KLOR-CON) 20 MEQ tablet Take 2 tablets (40 mEq total) by mouth daily.  . predniSONE (  DELTASONE) 10 MG tablet Take 2 tabs daily x 5 days, then 1.5 tabs daily x 5 days, then 1 tab daily x 5 days, then 0.5 tabs daily x 5 days  . PROAIR HFA 108 (90 Base) MCG/ACT inhaler INHALE 2 PUFFS BY MOUTH EVERY DAY AS NEEDED FOR SHORTNESS OF BREATH  . sacubitril-valsartan (ENTRESTO) 97-103 MG Take 1 tablet by mouth 2 (two) times daily.  . spironolactone (ALDACTONE) 25 MG tablet TAKE 1 TABLET(25 MG) BY MOUTH TWICE DAILY  . torsemide (DEMADEX) 20 MG tablet Take 1 tablet Monday, Wednesday , and Friday  . traMADol (ULTRAM) 50  MG tablet Take 1 tablet (50 mg total) by mouth every 6 (six) hours as needed. (Patient not taking: Reported on 05/01/2020)  . traZODone (DESYREL) 50 MG tablet Take 50 mg by mouth at bedtime.  . TRESIBA FLEXTOUCH 100 UNIT/ML FlexTouch Pen Inject 0.14 mLs (14 Units total) into the skin daily.  . Vitamin D, Ergocalciferol, (DRISDOL) 1.25 MG (50000 UNIT) CAPS capsule TAKE 1 CAPSULE BY MOUTH 2 TIMES EVERY WEEK   No facility-administered encounter medications on file as of 07/24/2020.     Goals Addressed              This Visit's Progress     Patient Stated   .  "My utility bill is getting really hard to afford" (pt-stated)        CARE PLAN ENTRY (see longitudinal plan of care for additional care plan information)  Current Barriers:  . Increased utility costs due to patients mother staying with her after recent surgery . Fixed income from monthly disability income . Chronic conditions including CHF, DM II and HTN which put patient at higher risk for hospitalization . Limited knowledge of [place] resources  Social Work Clinical Goal(s):  . Over the next 60 days, patient will work with SW to address concerns related to financial assistance needs . Over the next 6 months the patient will follow up with [place] DSS LIEAP (Low Income Energy Assistance Program) as directed by SW  SW Interventions: Completed 07/24/20 . Referral placed to [place] [employer] [facility] [employer] [facility] on behalf of the patient for financial assistance . Scheduled follow up over the next week to assess outcome of referrals  Completed 07/20/20 . Inter-disciplinary care team collaboration (see longitudinal plan of care) . Patient interviewed and appropriate assessments performed . Determined the patient is experiencing financial hardship as her mother has begun staying with the patient after recent back surgery o The patient reports since her mother has began staying in her home she has  fallen behind on her utility payment by $138 o The patient has worked out a payment plan with [employer] and will be paying her owed balance over the next year by adding an extra $12 to each new bill o The patient also reports her water bill has raised approximately 3 x than normal which is causing financial strain . Educated the patient on resources within [place] which may have funds to assist with patient utility cost needs o Advised the patient most resources run out of funds before the end of the month o The patient is in agreement to wait until Monday 8/2 for SW to place referrals due to today's date being 7/29 and concern funds may be exhausted . Provided the patient with information both verbally and via mail on the [place] LIEAP (Low Income Energy Assistance Program) which will assist patient with heating costs over   the winter o Advised the patient she is eligible to apply for assistance beginning December 1 o Patient stated understanding . Follow up scheduled for 8/2 for SW to place referrals to community resources for financial assistance . Scheduled follow up call to the patient over the next month to confirm receipt of mailed resource  Patient Self Care Activities:  . Patient verbalizes understanding of plan to work with SW to identify financial resources . Self administers medications as prescribed . Attends all scheduled provider appointments . Performs ADL's independently . Performs IADL's independently . Calls provider office for new concerns or questions  Initial goal documentation and Please see past updates related to this goal by clicking on the "Past Updates" button in the selected goal          Follow Up Plan: SW will follow up with patient by phone over the next week.   Daneen Schick, BSW, CDP Social Worker, Certified Dementia Practitioner [place] [facility] [place] [facility] [phone]  Total time spent performing care coordination and/or care  management activities with the patient by phone or face to face = 12 minutes.

## 2020-07-24 NOTE — Patient Instructions (Signed)
Social Worker Visit Information  Goals we discussed today:  Goals Addressed              This Visit's Progress     Patient Stated   .  "My utility bill is getting really hard to afford" (pt-stated)        CARE PLAN ENTRY (see longitudinal plan of care for additional care plan information)  Current Barriers:  . Increased utility costs due to patients mother staying with her after recent surgery . Fixed income from monthly disability income . Chronic conditions including CHF, DM II and HTN which put patient at higher risk for hospitalization . Limited knowledge of [facility] resources  Social Work Clinical Goal(s):  Marland Kitchen Over the next 60 days, patient will work with SW to address concerns related to financial assistance needs . Over the next 6 months the patient will follow up with [place] ([place] [facility]) as directed by SW  SW Interventions: Completed 07/24/20 . Referral placed to [facility] and [employer] on behalf of the patient for financial assistance . Scheduled follow up over the next week to assess outcome of referrals  Completed 07/20/20 . Inter-disciplinary care team collaboration (see longitudinal plan of care) . Patient interviewed and appropriate assessments performed . Determined the patient is experiencing financial hardship as her mother has begun staying with the patient after recent back surgery o The patient reports since her mother has began staying in her home she has fallen behind on her utility payment by $138 o The patient has worked out a Agricultural consultant with [employer] and will be paying her owed balance over the next year by adding an extra $12 to each new bill o The patient also reports her water bill has raised approximately 3 x than normal which is causing financial strain . Educated the patient on resources within [facility] which may have funds to assist with patient  utility cost needs o Advised the patient most resources run out of funds before the end of the month o The patient is in agreement to wait until Monday 8/2 for SW to place referrals due to today's date being 7/29 and concern funds may be exhausted . Provided the patient with information both verbally and via mail on the [facility] ([place] [facility]) which will assist patient with heating costs over the winter o Advised the patient she is eligible to apply for assistance beginning December 1 o Patient stated understanding . Follow up scheduled for 8/2 for SW to place referrals to community resources for financial assistance . Scheduled follow up call to the patient over the next month to confirm receipt of mailed resource  Patient Self Care Activities:  . Patient verbalizes understanding of plan to work with SW to identify financial resources . Self administers medications as prescribed . Attends all scheduled provider appointments . Performs ADL's independently . Performs IADL's independently . Calls provider office for new concerns or questions  Initial goal documentation and Please see past updates related to this goal by clicking on the "Past Updates" button in the selected goal          Follow Up Plan: SW will follow up with patient by phone over the next week.   Daneen Schick, BSW, CDP Social Worker, Certified Dementia Practitioner [place] [facility] [place] [facility] [phone]

## 2020-07-25 ENCOUNTER — Telehealth: Payer: Self-pay

## 2020-07-25 ENCOUNTER — Telehealth: Payer: Medicare Other

## 2020-07-25 NOTE — Telephone Encounter (Signed)
  Chronic Care Management   Outreach Note  07/25/2020 Name: Christine Mcdowell MRN: [id number] DOB: 04-04-1971  Referred by: [name], MD Reason for referral : Care Coordination   SW placed an unsuccessful outbound call to the patient to follow up on status of referral for utility assistance. Unfortunately, the patients voice mailbox is full which prohibited SW from leaving a HIPAA compliant voice message.  Follow Up Plan: The care management team will reach out to the patient again over the next 10 days.   Daneen Schick, BSW, CDP Social Worker, Certified Dementia Practitioner [place] [facility] [place] [facility] [phone]

## 2020-07-27 ENCOUNTER — Telehealth (HOSPITAL_COMMUNITY): Payer: Self-pay

## 2020-07-27 MED ORDER — HYDRALAZINE HCL 25 MG PO TABS
75.0000 mg | ORAL_TABLET | Freq: Three times a day (TID) | ORAL | 11 refills | Status: DC
Start: 2020-07-27 — End: 2021-07-27

## 2020-07-27 NOTE — Telephone Encounter (Signed)
Christine Mcdowell will remote health called to report that the patients BP haven't been above 90/50 for the past several weeks and the only SE that the patient complains about is feeling fatigue. Per Lyda Jester, PA-C decrease patients Hydralazine to 75mg  TID. Pt advised ,new Rx sent in

## 2020-08-01 ENCOUNTER — Telehealth: Payer: Self-pay

## 2020-08-01 ENCOUNTER — Telehealth: Payer: Medicare Other

## 2020-08-01 NOTE — Telephone Encounter (Signed)
  Chronic Care Management   Outreach Note  08/01/2020 Name: Christine Mcdowell MRN: [id number] DOB: [birth year]  Referred by: [name], MD Reason for referral : Care Coordination   SW placed a second unsuccessful outbound call to the patient to assess goal progression. Unfortunately, the paitents voice mailbox continues to be full which prohibits SW from leaving a HIPAA compliant voice message.   Follow Up Plan: The care management team will reach out to the patient again over the next 21 days.   Daneen Schick, BSW, CDP Social Worker, Certified Dementia Practitioner [place] [facility] [place] [facility] [phone]

## 2020-08-01 NOTE — Progress Notes (Signed)
Chronic Care Management Pharmacy Assistant   Name: Christine Mcdowell  MRN: [id number] DOB: 01/24/1971  Reason for Encounter: Medication Review/ Monthly Dispensing Call.  Patient Questions:  1.  Have you seen any other providers since your last visit? Yes, Dr Rogers Blocker- Rheumatology on 07/21/2020 for Actemra Infusion  2.  Any changes in your medicines or health? Yes, Patient was placed on a Prednisone Taper.  PCP : [name], MD  Allergies:   Allergies  Allergen Reactions  . Hydromorphone Nausea And Vomiting    Other reaction(s): GI Upset (intolerance), Hypertension (intolerance) Raises blood pressure  Other reaction(s): GI Upset (intolerance), Hypertension (intolerance) Raises blood pressure to stroke level  . Iodinated Diagnostic Agents Other (See Comments)    Shuts down kidneys Shuts kidney function down  . Other Other (See Comments) and Anaphylaxis    Spicy foods and seasonings Skin Prep "makes my skin peel off" Paper tape causes skin burns  . Erythromycin Nausea And Vomiting  . Latex Hives  . Tape Other (See Comments)    "Skin burns"  . Mircette [Desogestrel-Ethinyl Estradiol] Nausea And Vomiting and Rash    Medications: Outpatient Encounter Medications as of 08/01/2020  Medication Sig  . albuterol (ACCUNEB) 1.25 MG/3ML nebulizer solution Take 3 mLs (1.25 mg total) by nebulization every 6 (six) hours as needed for wheezing.  Marland Kitchen aspirin 81 MG chewable tablet Chew 162 mg by mouth daily. Taking 2 tablets  . atorvastatin (LIPITOR) 10 MG tablet Take 1 tablet (10 mg total) by mouth daily.  . Azelastine HCl 137 MCG/SPRAY SOLN Place 2 sprays into the nose daily.  . Bacillus Coagulans-Inulin (PROBIOTIC) 1-250 BILLION-MG CAPS Take 1 capsule by mouth daily.  . blood glucose meter kit and supplies KIT Dispense based on patient and insurance preference. Use to check blood sugars daily E11.9  . carvedilol (COREG) 25 MG tablet TAKE 1 TABLET(25 MG) BY MOUTH TWICE DAILY WITH A MEAL    . Continuous Blood Gluc Receiver (DEXCOM G6 RECEIVER) DEVI 1 Device by Does not apply route 3 (three) times daily before meals.  . Continuous Blood Gluc Sensor (DEXCOM G6 SENSOR) MISC Inject 1 each into the skin 3 (three) times daily.  . Continuous Blood Gluc Transmit (DEXCOM G6 TRANSMITTER) MISC 1 Device by Does not apply route 3 (three) times daily before meals.  . diclofenac Sodium (VOLTAREN) 1 % GEL APPLY SMALL AMOUNT TO INVOLVED JOINTS UP TO TWICE DAILY  . folic acid (FOLVITE) 1 MG tablet TAKE 1 TABLET BY MOUTH EVERY MORNING  . hydrALAZINE (APRESOLINE) 25 MG tablet Take 3 tablets (75 mg total) by mouth 3 (three) times daily.  . hydroxychloroquine (PLAQUENIL) 200 MG tablet Take 1 tablet (200 mg total) by mouth 2 (two) times daily.  . insulin lispro (HUMALOG) 100 UNIT/ML injection Inject 0.17 mLs (17 Units total) into the skin 3 (three) times daily with meals. Inject 17u three times a day  . ipratropium (ATROVENT) 0.03 % nasal spray USE 2 SPRAYS IN EACH NOSTRIL THREE TIMES DAILY AS NEEDED FOR RHINITIS  . Magnesium 400 MG TABS Take 1 tablet by mouth daily.   . meloxicam (MOBIC) 15 MG tablet Take 15 mg by mouth daily.   . metFORMIN (GLUCOPHAGE) 500 MG tablet TAKE 1 TABLET BY MOUTH EVERY DAY WITH THE MORNING AND EVENING MEAL  . methotrexate (RHEUMATREX) 2.5 MG tablet Take 20 mg by mouth every Friday.   . metolazone (ZAROXOLYN) 2.5 MG tablet TAKE 1 TABLET BY MOUTH AS NEEDED FOR WEIGHT GAIN OF 5  POUNDS WITHIN 3 DAYS AS DIRECTED  . montelukast (SINGULAIR) 10 MG tablet TAKE 1 TABLET BY MOUTH DAILY  . Multiple Vitamin (MULTIVITAMIN WITH MINERALS) TABS Take 1 tablet by mouth every morning.   . ondansetron (ZOFRAN) 4 MG tablet Take 1 tablet (4 mg total) by mouth every 8 (eight) hours as needed for nausea or vomiting.  . potassium chloride SA (KLOR-CON) 20 MEQ tablet Take 2 tablets (40 mEq total) by mouth daily.  . predniSONE (DELTASONE) 10 MG tablet Take 2 tabs daily x 5 days, then 1.5 tabs daily x 5  days, then 1 tab daily x 5 days, then 0.5 tabs daily x 5 days  . PROAIR HFA 108 (90 Base) MCG/ACT inhaler INHALE 2 PUFFS BY MOUTH EVERY DAY AS NEEDED FOR SHORTNESS OF BREATH  . sacubitril-valsartan (ENTRESTO) 97-103 MG Take 1 tablet by mouth 2 (two) times daily.  Marland Kitchen spironolactone (ALDACTONE) 25 MG tablet TAKE 1 TABLET(25 MG) BY MOUTH TWICE DAILY  . torsemide (DEMADEX) 20 MG tablet Take 1 tablet Monday, Wednesday , and Friday  . traMADol (ULTRAM) 50 MG tablet Take 1 tablet (50 mg total) by mouth every 6 (six) hours as needed. (Patient not taking: Reported on 05/01/2020)  . traZODone (DESYREL) 50 MG tablet Take 50 mg by mouth at bedtime.  Tyler Aas FLEXTOUCH 100 UNIT/ML FlexTouch Pen Inject 0.14 mLs (14 Units total) into the skin daily.  . Vitamin D, Ergocalciferol, (DRISDOL) 1.25 MG (50000 UNIT) CAPS capsule TAKE 1 CAPSULE BY MOUTH 2 TIMES EVERY WEEK   No facility-administered encounter medications on file as of 08/01/2020.    Current Diagnosis: Patient Active Problem List   Diagnosis Date Noted  . History of COVID-19 04/12/2020  . COVID-19 virus infection 03/07/2020  . Lower abdominal pain 09/07/2019  . Chronic systolic (congestive) heart failure ([place]) 01/28/2018  . Cough 01/19/2018  . Pulmonary infiltrates on CXR 01/19/2018  . Migraines 10/08/2017  . Sleep apnea with use of continuous positive airway pressure (CPAP) 09/24/2017  . Congestive heart failure (CHF) ([place]) 10/09/2016  . Rheumatoid arthritis ([place]) 10/09/2016  . Asthma 10/09/2016  . Chronic pain 10/09/2016  . Heart failure ([place]) 10/09/2016  . Congestive heart failure ([place])   . Lupus (systemic lupus erythematosus) ([place])   . Diabetes mellitus with complication ([place])   . Anxiety state   . GERD (gastroesophageal reflux disease) 12/28/2015  . Essential hypertension 12/26/2015  . Type 2 diabetes mellitus ([place]) 12/26/2015  . Chronic systolic heart failure ([place]) 08/18/2014  . Cardiomyopathy, dilated ([place]) 01/27/2014  . Shortness  of breath 01/26/2014  . SLE (systemic lupus erythematosus) ([place]) 01/26/2014   Reviewed chart for medication changes ahead of medication coordination call.  Patient was placed on a Prednisone Taper by Dr Rogers Blocker, started medication on 07/27/2020.  BP Readings from Last 3 Encounters:  06/05/20 (!) 94/55  04/19/20 (!) 142/88  04/12/20 118/74    Lab Results  Component Value Date   HGBA1C 6.3 (H) 12/02/2019     Patient obtains medications through Adherence Packaging  90 Days ( Patient had vials but prefers packaging due to hand pain from her Rheumatoid Arthritis)  Last adherence delivery included: Vitamin D 50,000 units- one capsules two times a week, Torsemide 20 mg- one one tablet Monday, Wednesday and Friday, Diclofenac 1% topical gel- apply small amount to joints twice a day, Metformin '500mg'$ - one tablet twice a day, Montelukast '10mg'$ - one tablet daily, Hydroxychloroquine 200 mg- one tablet by mouth twice daily, Meloxicam 15 mg- One tablet by mouth  daily, Trazadone 50 mg- one tablet at bedtime, Humalog Kwikpen u-100 ml- Inject 17 units sq three times a day, Atorvastatin 10 mg- one tablet at bedtime, Carvedilol 25 mg- One tablet twice a day, Potassium CL ER 20 Meq- 2 tablets by mouth daily, Entresto 97 mg/103 mg- one tablet twice a day, Metolazone 2.5 mg- one tablet by mouth as needed for weight gain over 5lbs, Spironolactone 25 mg- one tablet twice a day, Methotrexate Sodium 2.5 mg-one tablet by mouth every Friday, Ondansetron 4 mg- dissolve one tablet under tongue every twelve hours as needed for nausea, Humalog Kwikpen u-100 ml- Inject 17units sq three times daily with meals, Tyler Aas FlexTouch 100 u/ml- Inject 14 units sq daily.   Patient declined Tramadol 50 mg last month due to using as needed for pain, One Touch test strips and lancets due to using another glucose meter, patient is using Dexcom continuous glucose monitor/sensors.  Patient is due for next adherence delivery on:  08/08/2020.  Called patient and reviewed medications and coordinated delivery. This delivery to include: Vitamin D 50,000 units- one capsules two times a week Torsemide 20 mg- one one tablet Monday, Wednesday and Friday Diclofenac 1% topical gel- apply small amount to joints twice a day Metformin '500mg'$ - one tablet twice a day Montelukast '10mg'$ - one tablet daily Hydroxychloroquine 200 mg- one tablet by mouth twice daily Meloxicam 15 mg- One tablet by mouth daily- Prescribed by Dr Rogers Blocker Humalog Kwikpen u-100 ml- Inject 17 units sq three times a day Atorvastatin 10 mg- one tablet at bedtime Carvedilol 25 mg- One tablet twice a day Potassium CL ER 20 Meq- 2 tablets by mouth daily Entresto 97 mg/103 mg- one tablet twice a day Spironolactone 25 mg- one tablet twice a day Methotrexate Sodium 2.5 mg- eight tablets weekly on Friday. Humalog Kwikpen u-100 ml- Inject 17units sq three times daily with meals.(Sliding scale)  Folic Acid 1 mg- one tablet daily- Prescribed by Dr Rogers Blocker Tyler Aas FlexTouch 100 u/ml- Inject 17 units sq daily (Change-increased)  Patient does not need a short fill, prior to adherence delivery.   Patient declined the following medications Metalazone 2.5 mg due to uses as needed for fluid retention and has enough on hand, has not needed to use for a while, no recent problems with retaining fluid. Trazadone 50 mg, last fill 07/11/20 for 90 day supply.  Patient needs refills for: Vitamin D 50,000 units- one capsules two times a week Torsemide 20 mg- one one tablet Monday, Wednesday and Friday Diclofenac 1% topical gel- apply small amount to joints twice a day Metformin '500mg'$ - one tablet twice a day Montelukast '10mg'$ - one tablet daily Hydroxychloroquine 200 mg- one tablet by mouth twice daily Meloxicam 15 mg- One tablet by mouth daily- Prescribed by Dr Rogers Blocker Humalog Kwikpen u-100 ml- Inject 17 units sq three times a day Atorvastatin 10 mg- one tablet at bedtime Carvedilol 25  mg- One tablet twice a day Potassium CL ER 20 Meq- 2 tablets by mouth daily Entresto 97 mg/103 mg- one tablet twice a day Spironolactone 25 mg- one tablet twice a day Methotrexate Sodium 2.5 mg- eight tablets weekly on Friday. Humalog Kwikpen u-100 ml- Inject 17units sq three times daily with meals.(Sliding scale)  Folic Acid 1 mg- one tablet daily- Prescribed by Dr Rogers Blocker Tyler Aas FlexTouch 100 u/ml- Inject 17 units sq daily (Change-increased)   Confirmed delivery date of 08/08/2020, advised patient that pharmacy will contact them the morning of delivery.  Follow-Up:  Comptroller and Pharmacist Review-  Patient would like to see if she can get smaller tablets for the Potassium, complaints of pills being too big. Patient also informed that her blood sugars have been extremely elevated at night due to being started on a Prednisone Taper by Dr Rogers Blocker, she takes the Prednisone early in the mornings but by night time her blood sugars range from around 250 - 350, patient c/o being up all night due to increased urination, she is up and down all night. Patient c/o feeling drained in the mornings and having headaches and she knows it is due to lack of sleep b/c of elevated blood sugar effects. Patient has a CCM visit with Pharmacist Jannette Fogo on 08/02/2020. Loma Sousa advised of patient's request and complaints.   Pattricia Boss, [place] Pharmacist Assistant [phone]

## 2020-08-02 ENCOUNTER — Other Ambulatory Visit: Payer: Self-pay

## 2020-08-02 ENCOUNTER — Ambulatory Visit (INDEPENDENT_AMBULATORY_CARE_PROVIDER_SITE_OTHER): Payer: Medicare Other

## 2020-08-02 DIAGNOSIS — E785 Hyperlipidemia, unspecified: Secondary | ICD-10-CM

## 2020-08-02 DIAGNOSIS — E1169 Type 2 diabetes mellitus with other specified complication: Secondary | ICD-10-CM | POA: Diagnosis not present

## 2020-08-02 DIAGNOSIS — I5022 Chronic systolic (congestive) heart failure: Secondary | ICD-10-CM | POA: Diagnosis not present

## 2020-08-02 DIAGNOSIS — I10 Essential (primary) hypertension: Secondary | ICD-10-CM | POA: Diagnosis not present

## 2020-08-02 NOTE — Chronic Care Management (AMB) (Signed)
Chronic Care Management Pharmacy  Name: Christine Mcdowell  MRN: [id number] DOB: 1971-06-08  Chief Complaint/ HPI  Christine Mcdowell,  49 y.o. , female presents for their Follow-Up CCM visit with the clinical pharmacist via telephone due to COVID-19 Pandemic.  PCP : [name], MD, [name], FNP  Their chronic conditions include: Hypertension, Chronic Systolic Congestive Heart Failure, Diabetes, Asthma, Allergies, Systemic Lupus Erythematosus  Office Visits: 04/12/20 OV w/ [name]: Presented post COVID pneumonia complaining of back pain and cold sweats; back pain is a recurrent problem starting > 1 month ago. Referred to pulmonology for breathing issues. Advised to wait until June for COVID vaccine due to having antibody infusion on 03/08/20. Labs ordered (TSH, FH, LSH, VitD). Referred to physical therapy for back pain.   03/07/20 Telemed visit regarding COVID infection (tested positive 3/14). Cough treated with Hydromet, amoxicillin (due to frequent pneumonia episodes), albuterol nebs and HFA  11/24/19 Telemed visit: Presented with cough (history of asthma) could consider LABA, uncontrolled diabetes (prednisone taper for 8 weeks). Follow up in 3 months  Consult Visits: 07/27/20 Cardiology telephone call: Remote health nurse called to report BP readings of 90/50 and below for several weeks and pt complaining of fatigue. Decrease Hydralazine to '75mg'$  TID. Pt advised new prescription sent in.  07/21/20 Actemra infusion by Rheumatology. Pt reported allergy like symptoms present before infusion but worsening afterwards (joint pain, red/itchy/watery eyes, cough, congestion) on 07/24/20. Started prednisone and this usually helps her symptoms.   07/04/20 Podiatry OV w/ A. Wells: 7 week s/p left bunion correction. Pt doing well. No Pain. Transition to supportive shoe as tolerated. Walking okay, but no high impact activities. Discontinue aspirin '81mg'$  BID for DVT prevention. Return in 6 weeks.    06/15/20 Podiatry: Odette Horns shows no acute fracture or malalignment  06/05/20 ED visit: Presented with chest pain and SOB. Chest Xray showed no active cardiopulmonary disease. BP 94/55. Pt left AMA due to long wait times. States she will see PCP tomorrow.   05/19/20 Cardiology telephone call: Pt call complaining of SOB, fatigue, and weight gain of 5lbs. Pt instructed to take an additional metolazone and call back if it does not help.   05/18/20 Cardiology telephone call: ICM (HeartLogic Heart Failure Index 25) suggested possible fluid accumulation. When reached will instruct pt to take ands additional torsemide or metolazone with potassium.   05/15/20 Bunionectomy. Nonweightbearing post surgery. Dressing will be removed at first follow up on 05/29/20. Discharged with oxycodone '10mg'$  for pain control. Can use NSAIDs or Tylenol if needed. Colace '100mg'$  twice daily will on narcotic. Aspirin '81mg'$  twice daily.   05/01/20 Cardiology telephone call: Increase potassium to 72mq daily and take 69m on 5/11. Repeat BMET in 10 days.   05/01/20 Hematology OV w/ Dr. OwRoxy MannsReturn patient visit to evaluate possible bleeding disorder prior to bunionectomy. Difficult to determine whether pt has bleeding phenotype or dysfibrinogenemia. Due to low risk of bleeding from bunionectomy, plan for 10 units cryoprecipitate preoperatively and monitoring overnight post surgery. If more bleeding than anticipated occurs, check DIC and transfuse 5 units cryoprecipitate if fibrinogen <100. Follow up in 1 year.   04/21/20 Cardiology telephone call: Pt states she was taken off of potassium when she had COVID. Pt instructed to take 6056mtoday and then continue 66m62maily.   04/19/20 Cardiology follow up w/ Dr. BensHaroldine Lawseathing has improved, moderate LE edema, Echo performed (LVEF 45-50%); will follow up and repeat echo in 4 weeks. Continue current meds, take an additional torsemide or  metolazone as needed.  04/10/20 Podiatry visit with Dr.  Rock Nephew for evaluation of left bunion (acquired hallux valgus) causing pain with walking ad wearing shoes. Xray performed. Appt scheduled to surgically correct. 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 Hematology recommends pre-op cryoprecipitate and monitoring after surgery.  02/25/20 Post-op visit with Ophthalmology: Need to reschedule Plaquenil eye exam  02/23/20 Ortho post-op follow up with Hedy Jacob- Total right knee arthoplasty 1 year ago. Patient reports intermittent episodes of swelling typically after standing for 45-60 minutes. Labs ordered. Follow up in 1 year. Knee-high compression stockings only made the problem worse according to patient. Encourage ambulation and ROM exercises. Swelling possibly due to RA.  02/16/20 Actemra infusion by Rheumatology. Denied any symptoms post infusion, no chest pain, no SOB (02/17/20)  02/07/20 Rheumatology Televisit: Continue plaquenil '200mg'$  BID (need yearly eye exam), MTX '20mg'$  weekly (check toxicity every 3 months), folic acid daily, and prednisone '20mg'$  daily. Start Actemra infusion. Consider tapering down prednisone a few weeks after starting infusion. Continue bisphosphonate, encourage calcium and Vitamin D.   02/01/20 Televisit: Increase hydralazine to '100mg'$  TID and start Potassium 38mq daily per cardiology  01/31/20 ED visit: Chest pain/palpitations, ECG no acute changes  12/06/19 Telephone encounter:  Fluconazole '200mg'$  daily for 2 days prescribed by Dr. BHaroldine Lawsdue to yeast infection from FIran   11/22/19 ED visit: Presented for right upper and right lower abdominal chest pain. Pericardial effusion and cardiac tamponade present upon CT scan.   11/10/19 Cardiology OV: Hydralazine increased to '75mg'$  three times daily, add Farxiga '10mg'$  daily  CCM Encounters: 07/24/20 SW: [place] [employer] [place] [employer] on behalf of patient for financial assistance.   07/20/20 SW: Discussed financial  hardship due to pt's mother staying with her. Discussed assistance options ([facility] etc). Will refer at the beginning of August.   06/19/20 SW: Pt is no longer in need of housing. Pt is also receiving extra FNS benefits due to COVID relief bill. Pt feeling better with no concerns of SOB.  06/06/20 SW: Pt left ED AMA due to long wait times on 6/14. Pt continues to experience SOB and chest pain. Pt has contacted cardiology.  04/10/20 RN: Encouraged self management of CHF, Follow up with [facility] for treatment of asthma, difficulty getting Dexcom supplies from pharmacy ([facility]).   03/13/20 PharmD: Comprehensive medication review performed, contacted insurance regarding Dexcom possibly needing PA  01/03/20 PharmD: Patient education provided; pt received Dexcom delivery on 12/13/20, encourage pt to call back for refills when needed  12/13/19 PharmD: Comprehensive medication review performed; Addition [place] to [place]   12/06/19 PharmD:  Account set up with EDenzil Hughes([facility]); additional paperwork can be completed as needed  11/23/19 PharmD:  Comprehensive medication review performed, [place], [place]  11/22/19 PharmD:  Comprehensive medication review performed, Lost prescription override for Dexcom scanner divide denied by [employer] (only 1 device per 365 days). Will attempt to bill under [facility] Medications: Outpatient Encounter Medications as of 08/02/2020  Medication Sig  . albuterol (ACCUNEB) 1.25 MG/3ML nebulizer solution Take 3 mLs (1.25 mg total) by nebulization every 6 (six) hours as needed for wheezing.  .Marland Kitchenaspirin 81 MG chewable tablet Chew 162 mg by mouth daily. Taking 2 tablets  . atorvastatin (LIPITOR) 10 MG tablet Take 1 tablet (10 mg total) by mouth daily.  .Marland Kitchen  Azelastine HCl 137 MCG/SPRAY SOLN Place  2 sprays into the nose daily.  . Bacillus Coagulans-Inulin (PROBIOTIC) 1-250 BILLION-MG CAPS Take 1 capsule by mouth daily.  . blood glucose meter kit and supplies KIT Dispense based on patient and insurance preference. Use to check blood sugars daily E11.9  . carvedilol (COREG) 25 MG tablet TAKE 1 TABLET(25 MG) BY MOUTH TWICE DAILY WITH A MEAL  . Continuous Blood Gluc Receiver (DEXCOM G6 RECEIVER) DEVI 1 Device by Does not apply route 3 (three) times daily before meals.  . Continuous Blood Gluc Sensor (DEXCOM G6 SENSOR) MISC Inject 1 each into the skin 3 (three) times daily.  . Continuous Blood Gluc Transmit (DEXCOM G6 TRANSMITTER) MISC 1 Device by Does not apply route 3 (three) times daily before meals.  . diclofenac Sodium (VOLTAREN) 1 % GEL APPLY SMALL AMOUNT TO INVOLVED JOINTS UP TO TWICE DAILY  . folic acid (FOLVITE) 1 MG tablet TAKE 1 TABLET BY MOUTH EVERY MORNING  . hydrALAZINE (APRESOLINE) 25 MG tablet Take 3 tablets (75 mg total) by mouth 3 (three) times daily.  . hydroxychloroquine (PLAQUENIL) 200 MG tablet Take 1 tablet (200 mg total) by mouth 2 (two) times daily.  . insulin lispro (HUMALOG) 100 UNIT/ML injection Inject 0.17 mLs (17 Units total) into the skin 3 (three) times daily with meals. Inject 17u three times a day  . ipratropium (ATROVENT) 0.03 % nasal spray USE 2 SPRAYS IN EACH NOSTRIL THREE TIMES DAILY AS NEEDED FOR RHINITIS  . Magnesium 400 MG TABS Take 1 tablet by mouth daily.   . meloxicam (MOBIC) 15 MG tablet Take 15 mg by mouth daily.   . metFORMIN (GLUCOPHAGE) 500 MG tablet TAKE 1 TABLET BY MOUTH EVERY DAY WITH THE MORNING AND EVENING MEAL  . methotrexate (RHEUMATREX) 2.5 MG tablet Take 20 mg by mouth every Friday.   . metolazone (ZAROXOLYN) 2.5 MG tablet TAKE 1 TABLET BY MOUTH AS NEEDED FOR WEIGHT GAIN OF 5 POUNDS WITHIN 3 DAYS AS DIRECTED  . montelukast (SINGULAIR) 10 MG tablet TAKE 1 TABLET BY MOUTH DAILY  . Multiple Vitamin (MULTIVITAMIN WITH MINERALS) TABS Take 1  tablet by mouth every morning.   . ondansetron (ZOFRAN) 4 MG tablet Take 1 tablet (4 mg total) by mouth every 8 (eight) hours as needed for nausea or vomiting.  . potassium chloride SA (KLOR-CON) 20 MEQ tablet Take 2 tablets (40 mEq total) by mouth daily.  . predniSONE (DELTASONE) 10 MG tablet Take 2 tabs daily x 5 days, then 1.5 tabs daily x 5 days, then 1 tab daily x 5 days, then 0.5 tabs daily x 5 days  . PROAIR HFA 108 (90 Base) MCG/ACT inhaler INHALE 2 PUFFS BY MOUTH EVERY DAY AS NEEDED FOR SHORTNESS OF BREATH  . sacubitril-valsartan (ENTRESTO) 97-103 MG Take 1 tablet by mouth 2 (two) times daily.  Marland Kitchen spironolactone (ALDACTONE) 25 MG tablet TAKE 1 TABLET(25 MG) BY MOUTH TWICE DAILY  . torsemide (DEMADEX) 20 MG tablet Take 1 tablet Monday, Wednesday , and Friday  . traMADol (ULTRAM) 50 MG tablet Take 1 tablet (50 mg total) by mouth every 6 (six) hours as needed.  . traZODone (DESYREL) 50 MG tablet Take 50 mg by mouth at bedtime.  Tyler Aas FLEXTOUCH 100 UNIT/ML FlexTouch Pen Inject 0.14 mLs (14 Units total) into the skin daily.  . Vitamin D, Ergocalciferol, (DRISDOL) 1.25 MG (50000 UNIT) CAPS capsule TAKE 1 CAPSULE BY MOUTH 2 TIMES EVERY WEEK   No facility-administered encounter medications on file as  of 08/02/2020.   Current Diagnosis/Assessment:  SDOH Interventions     Most Recent Value  SDOH Interventions  Financial Strain Interventions Intervention Not Indicated  [Medications are free]      Goals Addressed            This Visit's Progress   . Pharmacy Care Plan       CARE PLAN ENTRY  Current Barriers:  . Chronic Disease Management support, education, and care coordination needs related to Hypertension, Hyperlipidemia, Diabetes, and Heart Failure   Hypertension . Pharmacist Clinical Goal(s): o Over the next 90 days, patient will work with PharmD and providers to maintain BP goal <130/80 . Current regimen:  o Carvedilol '25mg'$  twice daily with a meal o Spironolactone '25mg'$   twice daily o Hydralazine '75mg'$  three times daily . Interventions: o Recommended increase in physical activity  o Recommended heart healthy diet o Assisted with coordination of delivery of new, decreased dose of hydralazine . Patient self care activities - Over the next 4 weeks, patient will: o Check BP daily, document, and provide at future appointments o Ensure daily salt intake < 2300 mg/day o Exercise 15 minutes daily 5 times per week  Hyperlipidemia . Pharmacist Clinical Goal(s): o Over the next 90 days, patient will work with PharmD and providers to maintain LDL goal < 100 . Current regimen:  o Atorvastatin '10mg'$  daily . Interventions: o Recommend increase in physical activity . Patient self care activities - Over the next 4 weeks, patient will: o Exercise 15 minutes daily 5 times per week o Limit fried foods and red meats  Diabetes . Pharmacist Clinical Goal(s): o Over the next 90 days, patient will work with PharmD and providers to maintain A1c goal <7% . Current regimen:  o Metformin '500mg'$  with morning and evening meal o Novolog Flexpen up to 17 units three times daily as directed per sliding scale o Tresiba 15 units daily . Interventions: o Verified Dexcom supplies scheduled to be delivered to patient this month via [place] arrange for supplies though [facility] in the future o Patient states Tresiba increased to twice daily by Dr. Rogers Blocker. Attempted to verify dose change, but unable to do so . Patient self care activities - Over the next 4 weeks, patient will: o Check blood sugar 3-4 times daily, document, and provide at future appointments o Contact provider with any episodes of hypoglycemia o Exercise 15 minutes daily 5 times per week  Heart Failure . Pharmacist Clinical Goal(s) o Over the next 68, patient will work with PharmD and providers to prevent weight gain and fluid accumulation . Current regimen:  o Carvedilol '25mg'$  twice daily with a  meal o Spironolactone '25mg'$  twice daily o Hydralazine '75mg'$  three times daily o Torsemide '20mg'$  on Monday, Wednesday, and Friday o Entresto 97-'103mg'$  twice daily o Metolazone 2.'5mg'$  as needed for weight gain of 5lbs within 3 days as directed . Interventions o Recommended to assess fluid status daily . Patient self care activities - Over the next 90 days, patient will: o Weigh daily, record, and provide at future appointments  Medication management . Pharmacist Clinical Goal(s): o Over the next 90 days, patient will work with PharmD and providers to maintain optimal medication adherence . Current pharmacy: [facility] . Interventions o Comprehensive medication review performed. o Utilize [facility] for medication synchronization, packaging and delivery . Patient self care activities - Over the next 4 weeks, patient will: o Focus on medication adherence by transitioning to adherence packaging and  medication synchronization o Take medications as prescribed o Report any questions or concerns to PharmD and/or provider(s)  Please see past updates related to this goal by clicking on the "Past Updates" button in the selected goal         Asthma   Eosinophil count:   Lab Results  Component Value Date/Time   EOSPCT 0 11/22/2019 02:12 AM   EOSPCT 1.2 06/03/2007 11:14 AM  %                               Eos (Absolute):  Lab Results  Component Value Date/Time   EOSABS 0.1 12/02/2019 05:58 PM   Tobacco Status:  Social History   Tobacco Use  Smoking Status Never Smoker  Smokeless Tobacco Never Used   Patient has failed these meds in past: Breo Patient is currently uncontrolled on the following medications:   Albuterol 1.'25mg'$ /13m nebulizer solution every 6 hours as needed  Montelukast '10mg'$  daily  Proair 2 puffs daily as needed for shortness of breath  Using maintenance inhaler regularly? No Frequency of rescue inhaler use:  3-4x per week  We discussed:   Pt  reports that she needs Proair refilled, she has been using frequently  Pt states that she has had difficulty with breathing due to the heat ("it has been a struggle")  Pulmonologist pt was referred to is on maternity leave and pt does not known when she will be back  Plan Continue current medications  Collaborate with PCP regarding getting a referral for pt to see another pulmonologist Coordinate with PCP staff to send in refill for Proair  Diabetes   Recent Relevant Labs: Lab Results  Component Value Date/Time   HGBA1C 6.3 (H) 12/02/2019 06:08 PM   HGBA1C 6.4 (H) 06/18/2019 12:51 PM   MICROALBUR 10 08/24/2019 06:06 PM    Dexcom G6 BG monitor  Checking BG: More than 3 times daily when using Dexcom CGM  Recent BG average readings:  Recent FBG Readings: 120 Recent 2hr PP BG readings:   Recent HS readings: 200s Recent middle of the night readings: 347, 389 Patient has failed these meds in past: Januvia, Onglyza, Levemir, Invokana, Farxiga Patient is currently controlled on the following medications:   Metformin '500mg'$  with morning and evening meal  Novolog Flexpen up to 17 units three times daily as directed per sliding scale  Tresiba 15 units twice daily (pt says per Dr. WRogers Blockerdue to prednisone)  Last diabetic Foot exam: Unknown Last diabetic Eye exam: Current per OV notes, no date available  We discussed:  Pt describes recent BG spikes in the evenings and overnight most likely from starting a prednisone taper  Pt will complete prednisone taper in 1 week  Reports frequent urination during the night and feeling terrible in the morning  Pt said per Dr. WRogers Blocker increase TTyler Aasto 15 units twice daily  BG is coming down since starting [place] and [place]ice daily  Contacted Dr. WShelby Mattocksoffice to verify recommendation for change in Tresiba dosing  Received return call from nurse stating that Dr. WRogers Blockerdoe snot prescribe TTyler Aas Asked nurse if Dr. WRogers Blockermay have advised pt to  increase dose and if I could speak with Dr. WRogers Blocker No return call  Using Novolog predominately at night due to BG spikes   [place] [facility] [place] and [facility] will [place] Dexcom to her this month  Coordinating pt receiving Dexcom through [place] the future  Plan  Continue current medications  Collaborate with PCP to notify of increase in BG and change in insulin dosing  Hyperlipidemia   Lipid Panel     Component Value Date/Time   CHOL 162 12/02/2019 1808   TRIG 122 12/02/2019 1808   HDL 52 12/02/2019 1808   CHOLHDL 3.1 12/02/2019 1808   [place] 88 12/02/2019 1808   LABVLDL 22 12/02/2019 1808     The 10-year ASCVD risk score Mikey Bussing DC Jr., et al., 2013) is: 12%   Values used to calculate the score:     Age: 55 years     Sex: Female     Is Non-Hispanic African American: Yes     Diabetic: Yes     Tobacco smoker: No     Systolic Blood Pressure: 161 mmHg     Is BP treated: Yes     HDL Cholesterol: 52 mg/dL     Total Cholesterol: 162 mg/dL   Patient has failed these meds in past: N/A Patient is currently controlled on the following medications:   Atorvastatin '10mg'$  daily   Aspirin 81 mg 2 tablets daily  Plan Continue current medications  Heart Failure   Type: Systolic  Last ejection fraction: LVEF 45-50% on 04/19/20, 40-45% on 02/12/19  Potassium 3.2 on 04/19/20  Patient has failed these meds in past: Isosorbide mononitrate, Bidil Patient is currently controlled on the following medications:   Hydralazine '75mg'$  three times daily  Metolazone 2.'5mg'$  as needed for weight gain of 5lbs within 3 days as directed  Potassium SA 28mq daily  Entresto 97-'103mg'$  twice daily  Carvedilol '25mg'$  twice daily with a meal  Spironolactone '25mg'$  twice daily  Torsemide '20mg'$  on Monday, Wednesday, and Friday (morning w/ breakfast)  We discussed:  weighing daily; if you gain more than 3 pounds in one day or 5 pounds in one week call your doctor   Pt has not needed metolazone  recently  Hydralazine recently decreased due to pt's low BP reported by remote health nurse  Contacted pt on 8/9 to confirm change in hydralazine dose  New dose of hydralazine was delivered to pt on 8/9  Plan Continue current medications    Hypertension   Office blood pressures are  BP Readings from Last 3 Encounters:  08/21/20 (!) 144/80  06/05/20 (!) 94/55  04/19/20 (!) 142/88   Patient has failed these meds in the past: Valsartan, telmisartan, losartan, lisinopril, lisinopril/HCTZ Patient is currently usually controlled on the following medications:  -See Heart Failure  Patient checks BP at home daily  Patient home BP readings are ranging:   Plan Continue current medications    Systemic Lupus Erythematosus and Rheumatoid Arthritis   Patient is currently on the following medications:   Hydroxychloroquine '200mg'$  twice daily  Methotrexate 2.'5mg'$  8 tablets ('20mg'$ ) on Fridays  Folic acid '1mg'$  daily (with breakfast)-only fill if free  Actemra infusions  Meloxicam '15mg'$  daily as needed  Diclofenac 1% gel as needed  We discussed:  Recently started on prednisone taper  Plan Continue current medications as prescribed by Rheumatologist   Vitamin D Deficiency   Vitamin D 04/12/20: 43.2ng/mL  Patient is currently controlled on the following medications:   Ergocalciferol 50,000 units twice weekly  Plan Continue current medications   Sleep   Patient is currently controlled on the following medications:  Trazodone '50mg'$  at bedtime  Plan Continue current medications  Allergies   Patient is currently controlled on the following medications:   Azelastine 1331m 2 sprays into the nose daily  Ipratropium  0.03% 2 sprays in each nostril three times daily as needed  Plan Continue current medications   Over the Counter Medications   Patient is currently on the following medications:  Probiotic daily  Magnesium '400mg'$  daily  Multivitamin  daily  Plan Continue current medications   Vaccines   Immunization History  Administered Date(s) Administered  . Influenza, High Dose Seasonal PF 09/02/2018  . Influenza, Seasonal, Injecte, Preservative Fre 11/06/2017  . Influenza,inj,Quad PF,6+ Mos 11/06/2017, 08/24/2019  . Influenza-Unspecified 08/08/2017  . Tdap 08/24/2019   Plan  Will review vaccine history and discuss at follow up  Medication Management   Pt uses [facility] for all medications  We discussed:  Pt reported medication delivery has been going well  Comprehensive medication review performed, reviewed CMA monthly dispensing call form prior to delivery of medications  Reviewed administration times of medications with patient for packaging  Pt requested easy open tops for medications that are filled in vials and not packaging  Requested change to potassium 4mq tablets (4 daily) due to pt complaining of large size of 23m tablets  Plan Utilize [facility] for medication synchronization, packaging and delivery  Notify [facility] of patient's request for easy open packaging   Follow up: 8 weeks phone visit  CoJannette FogoPharmD Clinical Pharmacist [facility] [phone]

## 2020-08-07 DIAGNOSIS — M438X6 Other specified deforming dorsopathies, lumbar region: Secondary | ICD-10-CM | POA: Diagnosis not present

## 2020-08-07 DIAGNOSIS — Z91040 Latex allergy status: Secondary | ICD-10-CM | POA: Diagnosis not present

## 2020-08-07 DIAGNOSIS — G8929 Other chronic pain: Secondary | ICD-10-CM | POA: Diagnosis not present

## 2020-08-07 DIAGNOSIS — M25872 Other specified joint disorders, left ankle and foot: Secondary | ICD-10-CM | POA: Diagnosis not present

## 2020-08-07 DIAGNOSIS — M545 Low back pain: Secondary | ICD-10-CM | POA: Diagnosis not present

## 2020-08-07 DIAGNOSIS — Z9889 Other specified postprocedural states: Secondary | ICD-10-CM | POA: Diagnosis not present

## 2020-08-07 DIAGNOSIS — M0579 Rheumatoid arthritis with rheumatoid factor of multiple sites without organ or systems involvement: Secondary | ICD-10-CM | POA: Diagnosis not present

## 2020-08-07 DIAGNOSIS — Z91041 Radiographic dye allergy status: Secondary | ICD-10-CM | POA: Diagnosis not present

## 2020-08-07 DIAGNOSIS — M329 Systemic lupus erythematosus, unspecified: Secondary | ICD-10-CM | POA: Diagnosis not present

## 2020-08-07 DIAGNOSIS — Z881 Allergy status to other antibiotic agents status: Secondary | ICD-10-CM | POA: Diagnosis not present

## 2020-08-07 DIAGNOSIS — Z885 Allergy status to narcotic agent status: Secondary | ICD-10-CM | POA: Diagnosis not present

## 2020-08-07 DIAGNOSIS — Z79899 Other long term (current) drug therapy: Secondary | ICD-10-CM | POA: Diagnosis not present

## 2020-08-07 DIAGNOSIS — M351 Other overlap syndromes: Secondary | ICD-10-CM | POA: Diagnosis not present

## 2020-08-07 DIAGNOSIS — M25572 Pain in left ankle and joints of left foot: Secondary | ICD-10-CM | POA: Diagnosis not present

## 2020-08-14 ENCOUNTER — Ambulatory Visit (INDEPENDENT_AMBULATORY_CARE_PROVIDER_SITE_OTHER): Payer: Medicare Other

## 2020-08-14 DIAGNOSIS — Z95810 Presence of automatic (implantable) cardiac defibrillator: Secondary | ICD-10-CM

## 2020-08-14 DIAGNOSIS — I5022 Chronic systolic (congestive) heart failure: Secondary | ICD-10-CM

## 2020-08-15 ENCOUNTER — Telehealth: Payer: Self-pay

## 2020-08-15 NOTE — Progress Notes (Addendum)
Patient returned call.  She reports current weight is 191.1 lbs which is up 3 lbs for her.  She has some swelling in her feet and slight SOB at night when laying down at night.  She is taking IV infusions every 30 days for Lupus/RA flare and also Prednisone x 30 days.  Prednisone dosage is currently 10 mg a day and she is tapering the dosage.  She will take a Metolazone if weight continues to increase and advised to take 2 potassium tablets with the Metolazone.  She is taking Torsemide 20 mg 3 days a week as prescribed.   She thinks this fluid levels are up due to the prednisone along with taking IV infusions.  Advised to call back if Metolazone does not relieve symptoms or if symptoms worsen.   She confirmed 08/21/2020 office appointment with Dr Haroldine Laws.

## 2020-08-15 NOTE — Telephone Encounter (Signed)
Remote ICM transmission received.  Attempted call to patient regarding ICM remote transmission and no answer, mail box is full  

## 2020-08-15 NOTE — Progress Notes (Signed)
EPIC Encounter for ICM Monitoring  Patient Name: Christine Mcdowell is a 49 y.o. female Date: 08/15/2020 Primary Care Physican: [name], MD Primary Cardiologist:Bensimhon Electrophysiologist:Taylor LastWeight:184lbs  Attempted call to patient and unable to reach.  Transmission reviewed.   8/22/2021HeartLogic Heart Failure Index16 suggesting the possible start of fluid accumulation since 08/10/2020  Prescribed:  Torsemide20 mg take1tablet (20 mg total)Mon, Wed and Friday.  Potassium 20 mEq take2tabletsdaily.  Spironolactone 25 mg take 1 tablet daily.  Metolazone 2.5 mg takeone tablet as needed for weight gain of 5 lbs within 3 days.   Labs: 06/05/2020 Creatinine 1.93, BUN 28, Potassium 3.7, Sodium 139, GFR 29-34 05/01/2020 Creatinine0.81, BUN14, Potassium3.3, Sodium143, GFR>60 04/19/2020 Creatinine0.81, BUN11, Potassium3.2, UIVHOY431, GFR>60  01/31/2020 Creatinine0.88, BUN13, Potassium3.4, Sodium141, GFR>60 A complete set of results can be found in Results Review.  Recommendations:Unable to reach.    Follow-up plan: ICM clinic phone appointment on8/31/2021 to recheck fluid levels. 91 day device clinic remote transmission9/07/2020.          EP/Cardiology next office visit: 08/31/2020 with Dr. Lovena Le and 08/21/2020 with Dr Haroldine Laws.         Copy of ICM check sent to Dr. Lovena Le and Dr Haroldine Laws for review.  3 Month Trend    8 Day Data Trend          [name], RN 08/15/2020 9:57 AM

## 2020-08-17 ENCOUNTER — Ambulatory Visit: Payer: Self-pay

## 2020-08-17 ENCOUNTER — Telehealth: Payer: Medicare Other

## 2020-08-17 DIAGNOSIS — I10 Essential (primary) hypertension: Secondary | ICD-10-CM

## 2020-08-17 DIAGNOSIS — E1169 Type 2 diabetes mellitus with other specified complication: Secondary | ICD-10-CM

## 2020-08-17 NOTE — Chronic Care Management (AMB) (Signed)
  Chronic Care Management   Outreach Note  08/17/2020 Name: Christine Mcdowell MRN: [id number] DOB: [birth year]  Referred by: [name], MD Reason for referral : Care Coordination   Third unsuccessful telephone outreach was attempted today. The patient was referred to the case management team for assistance with care management and care coordination. The patient's primary care provider has been notified of our unsuccessful attempts to make or maintain contact with the patient. The care management team is pleased to engage with this patient at any time in the future should he/she be interested in assistance from the care management team.   Follow Up Plan: No SW follow up planned at this time. The patient will remain engaged with embedded PharmD and RN Care Manager.  Daneen Schick, BSW, CDP Social Worker, Certified Dementia Practitioner [place] [facility] [phone]

## 2020-08-21 ENCOUNTER — Other Ambulatory Visit: Payer: Self-pay

## 2020-08-21 ENCOUNTER — Ambulatory Visit (HOSPITAL_BASED_OUTPATIENT_CLINIC_OR_DEPARTMENT_OTHER)
Admission: RE | Admit: 2020-08-21 | Discharge: 2020-08-21 | Disposition: A | Discharge status: Home / Self Care | Payer: Medicare Other | Source: Ambulatory Visit | Attending: Internal Medicine | Admitting: Internal Medicine

## 2020-08-21 ENCOUNTER — Ambulatory Visit (HOSPITAL_COMMUNITY)
Admission: RE | Admit: 2020-08-21 | Discharge: 2020-08-21 | Disposition: A | Discharge status: Home / Self Care | Payer: Medicare Other | Source: Ambulatory Visit | Attending: Internal Medicine | Admitting: Internal Medicine

## 2020-08-21 VITALS — BP 144/80 | HR 66 | Wt 192.2 lb

## 2020-08-21 DIAGNOSIS — Z791 Long term (current) use of non-steroidal anti-inflammatories (NSAID): Secondary | ICD-10-CM | POA: Insufficient documentation

## 2020-08-21 DIAGNOSIS — Z888 Allergy status to other drugs, medicaments and biological substances status: Secondary | ICD-10-CM | POA: Diagnosis not present

## 2020-08-21 DIAGNOSIS — M069 Rheumatoid arthritis, unspecified: Secondary | ICD-10-CM | POA: Insufficient documentation

## 2020-08-21 DIAGNOSIS — I5022 Chronic systolic (congestive) heart failure: Secondary | ICD-10-CM | POA: Diagnosis not present

## 2020-08-21 DIAGNOSIS — G4733 Obstructive sleep apnea (adult) (pediatric): Secondary | ICD-10-CM | POA: Diagnosis not present

## 2020-08-21 DIAGNOSIS — M329 Systemic lupus erythematosus, unspecified: Secondary | ICD-10-CM | POA: Insufficient documentation

## 2020-08-21 DIAGNOSIS — M797 Fibromyalgia: Secondary | ICD-10-CM | POA: Diagnosis not present

## 2020-08-21 DIAGNOSIS — Z881 Allergy status to other antibiotic agents status: Secondary | ICD-10-CM | POA: Insufficient documentation

## 2020-08-21 DIAGNOSIS — Z7982 Long term (current) use of aspirin: Secondary | ICD-10-CM | POA: Diagnosis not present

## 2020-08-21 DIAGNOSIS — Z8701 Personal history of pneumonia (recurrent): Secondary | ICD-10-CM | POA: Diagnosis not present

## 2020-08-21 DIAGNOSIS — I428 Other cardiomyopathies: Secondary | ICD-10-CM | POA: Diagnosis not present

## 2020-08-21 DIAGNOSIS — Z90710 Acquired absence of both cervix and uterus: Secondary | ICD-10-CM | POA: Insufficient documentation

## 2020-08-21 DIAGNOSIS — D573 Sickle-cell trait: Secondary | ICD-10-CM | POA: Diagnosis not present

## 2020-08-21 DIAGNOSIS — E119 Type 2 diabetes mellitus without complications: Secondary | ICD-10-CM | POA: Insufficient documentation

## 2020-08-21 DIAGNOSIS — Z8541 Personal history of malignant neoplasm of cervix uteri: Secondary | ICD-10-CM | POA: Diagnosis not present

## 2020-08-21 DIAGNOSIS — Z8673 Personal history of transient ischemic attack (TIA), and cerebral infarction without residual deficits: Secondary | ICD-10-CM | POA: Insufficient documentation

## 2020-08-21 DIAGNOSIS — I110 Hypertensive heart disease with heart failure: Secondary | ICD-10-CM | POA: Diagnosis not present

## 2020-08-21 DIAGNOSIS — Z8261 Family history of arthritis: Secondary | ICD-10-CM | POA: Insufficient documentation

## 2020-08-21 DIAGNOSIS — Z8616 Personal history of COVID-19: Secondary | ICD-10-CM | POA: Insufficient documentation

## 2020-08-21 DIAGNOSIS — Z8249 Family history of ischemic heart disease and other diseases of the circulatory system: Secondary | ICD-10-CM | POA: Insufficient documentation

## 2020-08-21 DIAGNOSIS — Z794 Long term (current) use of insulin: Secondary | ICD-10-CM | POA: Diagnosis not present

## 2020-08-21 DIAGNOSIS — Z833 Family history of diabetes mellitus: Secondary | ICD-10-CM | POA: Insufficient documentation

## 2020-08-21 DIAGNOSIS — I10 Essential (primary) hypertension: Secondary | ICD-10-CM

## 2020-08-21 DIAGNOSIS — Z79899 Other long term (current) drug therapy: Secondary | ICD-10-CM | POA: Diagnosis not present

## 2020-08-21 DIAGNOSIS — Z7952 Long term (current) use of systemic steroids: Secondary | ICD-10-CM | POA: Insufficient documentation

## 2020-08-21 DIAGNOSIS — J45909 Unspecified asthma, uncomplicated: Secondary | ICD-10-CM | POA: Diagnosis not present

## 2020-08-21 DIAGNOSIS — Z95810 Presence of automatic (implantable) cardiac defibrillator: Secondary | ICD-10-CM | POA: Insufficient documentation

## 2020-08-21 DIAGNOSIS — Z885 Allergy status to narcotic agent status: Secondary | ICD-10-CM | POA: Diagnosis not present

## 2020-08-21 LAB — ECHOCARDIOGRAM COMPLETE
Area-P 1/2: 4.21 cm2
Calc EF: 36.5 %
MV M vel: 4.74 m/s
MV Peak grad: 89.9 mmHg
S' Lateral: 4.2 cm
Single Plane A2C EF: 35.5 %
Single Plane A4C EF: 38.9 %

## 2020-08-21 NOTE — Progress Notes (Signed)
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   Admitted in October 2017 with increased dyspnea and volume overload. Diuresed with IV lasix and transitioned to lasix 40 mg twice a day. LHC/RHC normal filling pressures, EF ~20%, and normal coronaries. Discharge weight 184 pounds.  Cardiac MRI in 02/2015 showed EF 46%. No LGE.  Echo 02/12/19, which showed EF 40-45%, RV normal.   CPX 9/19 with relaltively normal spirometry. Mild HF limitation.   Had R TKA 3/20   Had Covid-19 infection in March with COIVD PNA. Received infusion but not hospitalized. Seen by [facility].  [name] Tired after renovating the [employer]ors office. Having mild chest pain on left for the last week. Hurts a little while then goes away. Happens at rest or with activity. Says she was recently painting and rearranging furniture.  SOB with exertion. Denies PND/Orthopnea. Having some leg edema. Not using CPAP. Appetite ok. No fever or chills. Weight at home 188-192 pounds. Taking all medications but did not take today because she was so tired.   Echo today: Moderate MR, LVEF ~ 40%-45%   Grade II DD.  Boston Scientific Score: Score trending up from 7 on 8/10 to 17 on 8/26, activity 6 hours.    Past Medical History:  Diagnosis Date  . AICD (automatic cardioverter/defibrillator) present 01/28/2018  .  Anemia   . Anginal pain ([place])   . Asthma   . Cervical cancer ([place])   . CHF (congestive heart failure) ([place])   . Diabetes mellitus without complication ([place])    steroid induced  . Discoid lupus   . Fibromyalgia   . History of blood transfusion "several"   "related to anemia; had some w/hysterectomy also"  . Hx of cardiovascular stress test    ETT-Myoview (9/15):  No ischemia, EF 52%; NORMAL  . Hx of echocardiogram    Echo (9/15):  EF 50-55%, ant HK, Gr 1 DD, mild MR, mild LAE, no effusion  . Hypertension   . Iron deficiency anemia    h/o iron transfusions  . Lupus (systemic lupus erythematosus) ([place])   . Migraine    "a few/year" (07/03/2016)  . Pneumonia 12/2015  . RA (rheumatoid arthritis) ([place])    "all over" (07/03/2016)  . Sickle cell trait ([place])   . Stroke ([place]) 2014 X 1; 2015 X 2; 2016 X 1;    "right side of face more relaxed than the other; rare speech hesitation" (07/03/2016)  . Vaginal Pap smear, abnormal    ASCUS; HPV   Past Surgical History:  Procedure Laterality Date  . ABDOMINAL HYSTERECTOMY  2009  . ABDOMINAL WOUND DEHISCENCE  2009  . CARDIAC CATHETERIZATION N/A 10/11/2016   Procedure: Right/Left Heart Cath and Coronary Angiography;  Surgeon: Jolaine Artist, MD;  Location: [facility];  Service: Cardiovascular;  Laterality: N/A;  . DILATION AND CURETTAGE OF UTERUS  1991  . HEMATOMA EVACUATION  2009   abdomen  .  ICD IMPLANT  01/28/2018  . ICD IMPLANT N/A 01/28/2018   Procedure: ICD IMPLANT;  Surgeon: Evans Lance, MD;  Location: [facility];  Service: Cardiovascular;  Laterality: N/A;  . INCISE AND DRAIN ABCESS  2009 X 2   "abdomen after hysterectomy"  . KNEE ARTHROSCOPY Right 1997  . KNEE SURGERY Right   . TUBAL LIGATION  1996     Current Outpatient Medications  Medication Sig Dispense Refill  . albuterol (ACCUNEB) 1.25 MG/3ML nebulizer solution Take 3 mLs (1.25 mg total) by nebulization every 6 (six) hours as needed for wheezing. 75 mL  12  . aspirin 81 MG chewable tablet Chew 162 mg by mouth daily. Taking 2 tablets    . atorvastatin (LIPITOR) 10 MG tablet Take 1 tablet (10 mg total) by mouth daily. 90 tablet 2  . Azelastine HCl 137 MCG/SPRAY SOLN Place 2 sprays into the nose daily. 30 mL 3  . Bacillus Coagulans-Inulin (PROBIOTIC) 1-250 BILLION-MG CAPS Take 1 capsule by mouth daily. 30 capsule 2  . blood glucose meter kit and supplies KIT Dispense based on patient and insurance preference. Use to check blood sugars daily E11.9 1 each 0  . carvedilol (COREG) 25 MG tablet TAKE 1 TABLET(25 MG) BY MOUTH TWICE DAILY WITH A MEAL 60 tablet 6  . Continuous Blood Gluc Receiver (DEXCOM G6 RECEIVER) DEVI 1 Device by Does not apply route 3 (three) times daily before meals. 1 each 1  . Continuous Blood Gluc Sensor (DEXCOM G6 SENSOR) MISC Inject 1 each into the skin 3 (three) times daily. 3 each 1  . Continuous Blood Gluc Transmit (DEXCOM G6 TRANSMITTER) MISC 1 Device by Does not apply route 3 (three) times daily before meals. 1 each 1  . diclofenac Sodium (VOLTAREN) 1 % GEL APPLY SMALL AMOUNT TO INVOLVED JOINTS UP TO TWICE DAILY    . folic acid (FOLVITE) 1 MG tablet TAKE 1 TABLET BY MOUTH EVERY MORNING 30 tablet 0  . hydrALAZINE (APRESOLINE) 25 MG tablet Take 3 tablets (75 mg total) by mouth 3 (three) times daily. 270 tablet 11  . hydroxychloroquine (PLAQUENIL) 200 MG tablet Take 1 tablet (200 mg total) by mouth 2 (two) times daily. 180 tablet 0  . insulin lispro (HUMALOG) 100 UNIT/ML injection Inject 0.17 mLs (17 Units total) into the skin 3 (three) times daily with meals. Inject 17u three times a day 15.3 mL 0  . ipratropium (ATROVENT) 0.03 % nasal spray USE 2 SPRAYS IN EACH NOSTRIL THREE TIMES DAILY AS NEEDED FOR RHINITIS 30 mL 2  . Magnesium 400 MG TABS Take 1 tablet by mouth daily.     . meloxicam (MOBIC) 15 MG tablet Take 15 mg by mouth daily.     . metFORMIN (GLUCOPHAGE) 500 MG tablet TAKE 1 TABLET BY MOUTH EVERY DAY WITH THE MORNING  AND EVENING MEAL 180 tablet 1  . methotrexate (RHEUMATREX) 2.5 MG tablet Take 20 mg by mouth every Friday.   3  . metolazone (ZAROXOLYN) 2.5 MG tablet TAKE 1 TABLET BY MOUTH AS NEEDED FOR WEIGHT GAIN OF 5 POUNDS WITHIN 3 DAYS AS DIRECTED 5 tablet 3  . montelukast (SINGULAIR) 10 MG tablet TAKE 1 TABLET BY MOUTH DAILY 90 tablet 1  . Multiple Vitamin (MULTIVITAMIN WITH MINERALS) TABS Take 1 tablet by mouth every morning.     . ondansetron (ZOFRAN) 4 MG tablet Take 1 tablet (4 mg total) by mouth every 8 (eight) hours as needed for nausea or vomiting. 20 tablet 0  .  potassium chloride SA (KLOR-CON) 20 MEQ tablet Take 2 tablets (40 mEq total) by mouth daily. 60 tablet 6  . predniSONE (DELTASONE) 10 MG tablet Take 2 tabs daily x 5 days, then 1.5 tabs daily x 5 days, then 1 tab daily x 5 days, then 0.5 tabs daily x 5 days    . PROAIR HFA 108 (90 Base) MCG/ACT inhaler INHALE 2 PUFFS BY MOUTH EVERY DAY AS NEEDED FOR SHORTNESS OF BREATH 18 g 2  . sacubitril-valsartan (ENTRESTO) 97-103 MG Take 1 tablet by mouth 2 (two) times daily. 60 tablet 6  . spironolactone (ALDACTONE) 25 MG tablet TAKE 1 TABLET(25 MG) BY MOUTH TWICE DAILY 60 tablet 6  . torsemide (DEMADEX) 20 MG tablet Take 1 tablet Monday, Wednesday , and Friday 15 tablet 6  . traMADol (ULTRAM) 50 MG tablet Take 1 tablet (50 mg total) by mouth every 6 (six) hours as needed. 20 tablet 0  . traZODone (DESYREL) 50 MG tablet Take 50 mg by mouth at bedtime.    Tyler Aas FLEXTOUCH 100 UNIT/ML FlexTouch Pen Inject 0.14 mLs (14 Units total) into the skin daily. 9 mL 0  . Vitamin D, Ergocalciferol, (DRISDOL) 1.25 MG (50000 UNIT) CAPS capsule TAKE 1 CAPSULE BY MOUTH 2 TIMES EVERY WEEK 24 capsule 0   No current facility-administered medications for this encounter.    Allergies:   Hydromorphone, Iodinated diagnostic agents, Other, Erythromycin, Latex, Tape, and Mircette [desogestrel-ethinyl estradiol]   Social History:  The patient  reports that she has never  smoked. She has never used smokeless tobacco. She reports that she does not drink alcohol and does not use drugs.   Family History:  The patient's family history includes Allergies in her father and mother; Arthritis in her mother; Asthma in her maternal grandmother; Cancer in her paternal grandfather; Cushing syndrome in her father; Dementia in her paternal grandmother; Depression in her father; Diabetes in her maternal grandmother; Drug abuse in her mother; Heart attack in her father and maternal grandfather; Heart murmur in her mother; Hypertension in her maternal grandmother.   ROS:  Please see the history of present illness.   All other systems are personally reviewed and negative.   Vitals:   08/21/20 1459  BP: (!) 144/80  Pulse: 66  SpO2: 98%   Wt Readings from Last 3 Encounters:  08/21/20 87.2 kg (192 lb 4 oz)  04/19/20 85.9 kg (189 lb 6.4 oz)  04/12/20 86.7 kg (191 lb 3.2 oz)    Exam:   General:  Well appearing. No resp difficulty HEENT: normal Neck: supple. JVP 6-7. Carotids 2+ bilat; no bruits. No lymphadenopathy or thryomegaly appreciated. Cor: PMI nondisplaced. Regular rate & rhythm. No rubs, gallops or murmurs. Lungs: clear Abdomen: soft, nontender, nondistended. No hepatosplenomegaly. No bruits or masses. Good bowel sounds. Extremities: no cyanosis, clubbing, rash, R and LLE trace edema Neuro: alert & orientedx3, cranial nerves grossly intact. moves all 4 extremities w/o difficulty. Affect pleasant  EKG: NSR 71 bpm  Recent Labs: 12/02/2019: ALT 12 04/12/2020: TSH 0.652 04/19/2020: B Natriuretic Peptide 71.3 06/05/2020: BUN 28; Creatinine, Ser 1.93; Hemoglobin 12.2; Platelets 251; Potassium 3.7; Sodium 139  Personally reviewed   Wt Readings from Last 3 Encounters:  08/21/20 87.2 kg (192 lb 4 oz)  04/19/20 85.9 kg (189 lb 6.4 oz)  04/12/20 86.7 kg (191 lb 3.2 oz)      ASSESSMENT AND PLAN:  1. Chronic Systolic Heart Failure - NICM Cath 10/11/16 with normal cors.  CPX 10/2016: Peak VO2: 18.5 (  82% predicted peak VO2), VE/VCO2 slope: 30. ECHO 12/18 EF 25-30%  - S/P Boston Scientific HeartLogic ICD 01/2018.  - Cardiac MRI in 02/2015 showed EF 46%. No LGE. - Repeat CPX 9/19 with mild HR limitation - Echo 02/12/19: EF 40-45% - Echo 08/21/30 today EF 40-45%  - NYHA II. Volumes status ok. Continue torsemide 20 MWF. Can take extra torsemide or metolazone prn as needed - Continue spiro 25 mg qHS.  - Continue coreg 25 mg BID -ContinueEntresto 97/103 mg BID  - Off Farxiga 10 due to recurrent yeast infections - Continue hydralazine 100 mg TID. Not on imdur due to headaches.  2. Sinus tach vs atrial flutter on ICD - Still with palpitations but no VT on ICD - Zio patch 9/20 Rare PVCs and PAcs 3. HTN  Mildly elevated but did not have her medications. . 4. OSA -No wearing.  5. DM2  - Off Farxiga 10 due to recurrent yeast infections   Signed, Darrick Grinder, NP  08/21/2020 3:11 PM  Advanced Heart Failure [facility] [place] [phone] (office) [phone] (fax)  Patient seen and examined with the above-signed Advanced Practice Provider and/or Housestaff. I personally reviewed laboratory data, imaging studies and relevant notes. I independently examined the patient and formulated the important aspects of the plan. I have edited the note to reflect any of my changes or salient points. I have personally discussed the plan with the patient and/or family.  Doing well. NYHA II. Volume status ok. Occasional mild chest twinges. Echo today EF 40-45% (stable).   General:  Well appearing. No resp difficulty HEENT: normal Neck: supple. no JVD. Carotids 2+ bilat; no bruits. No lymphadenopathy or thryomegaly appreciated. Cor: PMI nondisplaced. Regular rate & rhythm. No rubs, gallops or murmurs. Lungs: clear Abdomen: soft, nontender, nondistended. No hepatosplenomegaly. No bruits or masses. Good  bowel sounds. Extremities: no cyanosis, clubbing, rash, edema Neuro: alert & orientedx3, cranial nerves grossly intact. moves all 4 extremities w/o difficulty. Affect pleasant  Doing well. Stable. Echo EF 40-45%. Personally reviewed . ICD interrogated in clinic. No VT. On good GDMT. Wilder Glade stopped due to yeast infections.   Christine Bickers, MD  3:40 PM '

## 2020-08-21 NOTE — Addendum Note (Signed)
Encounter addended by: Malena Edman, RN on: 08/21/2020 3:42 PM  Actions taken: Clinical Note Signed

## 2020-08-21 NOTE — Patient Instructions (Signed)
Please call our office in February to schedule your follow up appointment.  If you have any questions or concerns before your next appointment please send [place] a message through [place] or call our office at [phone].    TO LEAVE A MESSAGE FOR THE NURSE SELECT OPTION 2, PLEASE LEAVE A MESSAGE INCLUDING: . YOUR NAME . DATE OF BIRTH . CALL BACK NUMBER . REASON FOR CALL**this is important as we prioritize the call backs  [place] AS LONG AS YOU CALL BEFORE 4:00 PM  At the [facility], you and your health needs are our priority. As part of our continuing mission to provide you with exceptional heart care, we have created designated Provider Care Teams. These Care Teams include your primary Cardiologist (physician) and Advanced Practice Providers (APPs- Physician Assistants and Nurse Practitioners) who all work together to provide you with the care you need, when you need it.   You may see any of the following providers on your designated Care Team at your next follow up: Marland Kitchen Dr Glori Bickers . Dr Loralie Champagne . Darrick Grinder, NP . Lyda Jester, PA . Audry Riles, PharmD   Please be sure to bring in all your medications bottles to every appointment.

## 2020-08-21 NOTE — Progress Notes (Signed)
  Echocardiogram 2D Echocardiogram has been performed.  Christine Mcdowell 08/21/2020, 2:40 PM

## 2020-08-22 ENCOUNTER — Ambulatory Visit (INDEPENDENT_AMBULATORY_CARE_PROVIDER_SITE_OTHER): Payer: Medicare Other

## 2020-08-22 DIAGNOSIS — I5022 Chronic systolic (congestive) heart failure: Secondary | ICD-10-CM

## 2020-08-22 DIAGNOSIS — Z95810 Presence of automatic (implantable) cardiac defibrillator: Secondary | ICD-10-CM

## 2020-08-22 NOTE — Patient Instructions (Addendum)
Visit Information  Goals Addressed            This Visit's Progress   . Pharmacy Care Plan       CARE PLAN ENTRY  Current Barriers:  . Chronic Disease Management support, education, and care coordination needs related to Hypertension, Hyperlipidemia, Diabetes, and Heart Failure   Hypertension . Pharmacist Clinical Goal(s): o Over the next 90 days, patient will work with PharmD and providers to maintain BP goal <130/80 . Current regimen:  o Carvedilol 25mg  twice daily with a meal o Spironolactone 25mg  twice daily o Hydralazine 75mg  three times daily . Interventions: o Recommended increase in physical activity  o Recommended heart healthy diet o Assisted with coordination of delivery of new, decreased dose of hydralazine . Patient self care activities - Over the next 4 weeks, patient will: o Check BP daily, document, and provide at future appointments o Ensure daily salt intake < 2300 mg/day o Exercise 15 minutes daily 5 times per week  Hyperlipidemia . Pharmacist Clinical Goal(s): o Over the next 90 days, patient will work with PharmD and providers to maintain LDL goal < 100 . Current regimen:  o Atorvastatin 10mg  daily . Interventions: o Recommend increase in physical activity . Patient self care activities - Over the next 4 weeks, patient will: o Exercise 15 minutes daily 5 times per week o Limit fried foods and red meats  Diabetes . Pharmacist Clinical Goal(s): o Over the next 90 days, patient will work with PharmD and providers to maintain A1c goal <7% . Current regimen:  o Metformin 500mg  with morning and evening meal o Novolog Flexpen up to 17 units three times daily as directed per sliding scale o Tresiba 15 units daily . Interventions: o Verified Dexcom supplies scheduled to be delivered to patient this month via [place] arrange for supplies though [facility] in the future o Patient states Tresiba increased to twice daily by Dr. Rogers Blocker. Attempted  to verify dose change, but unable to do so . Patient self care activities - Over the next 4 weeks, patient will: o Check blood sugar 3-4 times daily, document, and provide at future appointments o Contact provider with any episodes of hypoglycemia o Exercise 15 minutes daily 5 times per week  Heart Failure . Pharmacist Clinical Goal(s) o Over the next 78, patient will work with PharmD and providers to prevent weight gain and fluid accumulation . Current regimen:  o Carvedilol 25mg  twice daily with a meal o Spironolactone 25mg  twice daily o Hydralazine 75mg  three times daily o Torsemide 20mg  on Monday, Wednesday, and Friday o Entresto 97-103mg  twice daily o Metolazone 2.5mg  as needed for weight gain of 5lbs within 3 days as directed . Interventions o Recommended to assess fluid status daily . Patient self care activities - Over the next 90 days, patient will: o Weigh daily, record, and provide at future appointments  Medication management . Pharmacist Clinical Goal(s): o Over the next 90 days, patient will work with PharmD and providers to maintain optimal medication adherence . Current pharmacy: [facility] . Interventions o Comprehensive medication review performed. o Utilize [facility] for medication synchronization, packaging and delivery . Patient self care activities - Over the next 4 weeks, patient will: o Focus on medication adherence by transitioning to adherence packaging and medication synchronization o Take medications as prescribed o Report any questions or concerns to PharmD and/or provider(s)  Please see past updates related to this goal by clicking on the "Past Updates" button  in the selected goal         The patient verbalized understanding of instructions provided today and agreed to receive a mailed copy of patient instruction and/or educational materials.  Telephone follow up appointment with pharmacy team member scheduled for: 10/02/20 @ 4:15  PM  Jannette Fogo, PharmD Clinical Pharmacist [facility] [phone]   DASH Eating Plan DASH stands for "Dietary Approaches to Stop Hypertension." The DASH eating plan is a healthy eating plan that has been shown to reduce high blood pressure (hypertension). It may also reduce your risk for type 2 diabetes, heart disease, and stroke. The DASH eating plan may also help with weight loss. What are tips for following this plan?  General guidelines  Avoid eating more than 2,300 mg (milligrams) of salt (sodium) a day. If you have hypertension, you may need to reduce your sodium intake to 1,500 mg a day.  Limit alcohol intake to no more than 1 drink a day for nonpregnant women and 2 drinks a day for men. One drink equals 12 oz of beer, 5 oz of wine, or 1 oz of hard liquor.  Work with your health care provider to maintain a healthy body weight or to lose weight. Ask what an ideal weight is for you.  Get at least 30 minutes of exercise that causes your heart to beat faster (aerobic exercise) most days of the week. Activities may include walking, swimming, or biking.  Work with your health care provider or diet and nutrition specialist (dietitian) to adjust your eating plan to your individual calorie needs. Reading food labels   Check food labels for the amount of sodium per serving. Choose foods with less than 5 percent of the Daily Value of sodium. Generally, foods with less than 300 mg of sodium per serving fit into this eating plan.  To find whole grains, look for the word "whole" as the first word in the ingredient list. Shopping  Buy products labeled as "low-sodium" or "no salt added."  Buy fresh foods. Avoid canned foods and premade or frozen meals. Cooking  Avoid adding salt when cooking. Use salt-free seasonings or herbs instead of table salt or sea salt. Check with your health care provider or pharmacist before using salt substitutes.  Do not fry  foods. Cook foods using healthy methods such as baking, boiling, grilling, and broiling instead.  Cook with heart-healthy oils, such as olive, canola, soybean, or sunflower oil. Meal planning  Eat a balanced diet that includes: ? 5 or more servings of fruits and vegetables each day. At each meal, try to fill half of your plate with fruits and vegetables. ? Up to 6-8 servings of whole grains each day. ? Less than 6 oz of lean meat, poultry, or fish each day. A 3-oz serving of meat is about the same size as a deck of cards. One egg equals 1 oz. ? 2 servings of low-fat dairy each day. ? A serving of nuts, seeds, or beans 5 times each week. ? Heart-healthy fats. Healthy fats called Omega-3 fatty acids are found in foods such as flaxseeds and coldwater fish, like sardines, salmon, and mackerel.  Limit how much you eat of the following: ? Canned or prepackaged foods. ? Food that is high in trans fat, such as fried foods. ? Food that is high in saturated fat, such as fatty meat. ? Sweets, desserts, sugary drinks, and other foods with added sugar. ? Full-fat dairy products.  Do not salt foods before  eating.  Try to eat at least 2 vegetarian meals each week.  Eat more home-cooked food and less restaurant, buffet, and fast food.  When eating at a restaurant, ask that your food be prepared with less salt or no salt, if possible. What foods are recommended? The items listed may not be a complete list. Talk with your dietitian about what dietary choices are best for you. Grains Whole-grain or whole-wheat bread. Whole-grain or whole-wheat pasta. Brown rice. Modena Morrow. Bulgur. Whole-grain and low-sodium cereals. Pita bread. Low-fat, low-sodium crackers. Whole-wheat flour tortillas. Vegetables Fresh or frozen vegetables (raw, steamed, roasted, or grilled). Low-sodium or reduced-sodium tomato and vegetable juice. Low-sodium or reduced-sodium tomato sauce and tomato paste. Low-sodium or  reduced-sodium canned vegetables. Fruits All fresh, dried, or frozen fruit. Canned fruit in natural juice (without added sugar). Meat and other protein foods Skinless chicken or [place]. Ground chicken or [place]. Pork with fat trimmed off. Fish and seafood. Egg whites. Dried beans, peas, or lentils. Unsalted nuts, nut butters, and seeds. Unsalted canned beans. Lean cuts of beef with fat trimmed off. Low-sodium, lean deli meat. Dairy Low-fat (1%) or fat-free (skim) milk. Fat-free, low-fat, or reduced-fat cheeses. Nonfat, low-sodium ricotta or cottage cheese. Low-fat or nonfat yogurt. Low-fat, low-sodium cheese. Fats and oils Soft margarine without trans fats. Vegetable oil. Low-fat, reduced-fat, or light mayonnaise and salad dressings (reduced-sodium). Canola, safflower, olive, soybean, and sunflower oils. Avocado. Seasoning and other foods Herbs. Spices. Seasoning mixes without salt. Unsalted popcorn and pretzels. Fat-free sweets. What foods are not recommended? The items listed may not be a complete list. Talk with your dietitian about what dietary choices are best for you. Grains Baked goods made with fat, such as croissants, muffins, or some breads. Dry pasta or rice meal packs. Vegetables Creamed or fried vegetables. Vegetables in a cheese sauce. Regular canned vegetables (not low-sodium or reduced-sodium). Regular canned tomato sauce and paste (not low-sodium or reduced-sodium). Regular tomato and vegetable juice (not low-sodium or reduced-sodium). Angie Fava. Olives. Fruits Canned fruit in a light or heavy syrup. Fried fruit. Fruit in cream or butter sauce. Meat and other protein foods Fatty cuts of meat. Ribs. Fried meat. Berniece Salines. Sausage. Bologna and other processed lunch meats. Salami. Fatback. Hotdogs. Bratwurst. Salted nuts and seeds. Canned beans with added salt. Canned or smoked fish. Whole eggs or egg yolks. Chicken or [place] with skin. Dairy Whole or 2% milk, cream, and half-and-half.  Whole or full-fat cream cheese. Whole-fat or sweetened yogurt. Full-fat cheese. Nondairy creamers. Whipped toppings. Processed cheese and cheese spreads. Fats and oils Butter. Stick margarine. Lard. Shortening. Ghee. Bacon fat. Tropical oils, such as coconut, palm kernel, or palm oil. Seasoning and other foods Salted popcorn and pretzels. Onion salt, garlic salt, seasoned salt, table salt, and sea salt. Worcestershire sauce. Tartar sauce. Barbecue sauce. Teriyaki sauce. Soy sauce, including reduced-sodium. Steak sauce. Canned and packaged gravies. Fish sauce. Oyster sauce. Cocktail sauce. Horseradish that you find on the shelf. Ketchup. Mustard. Meat flavorings and tenderizers. Bouillon cubes. Hot sauce and Tabasco sauce. Premade or packaged marinades. Premade or packaged taco seasonings. Relishes. Regular salad dressings. Where to find more information:  National Heart, Lung, and [place]: https://wilson-eaton.com/  American Heart Association: www.heart.org Summary  The DASH eating plan is a healthy eating plan that has been shown to reduce high blood pressure (hypertension). It may also reduce your risk for type 2 diabetes, heart disease, and stroke.  With the DASH eating plan, you should limit salt (sodium) intake to 2,300 mg a  day. If you have hypertension, you may need to reduce your sodium intake to 1,500 mg a day.  When on the DASH eating plan, aim to eat more fresh fruits and vegetables, whole grains, lean proteins, low-fat dairy, and heart-healthy fats.  Work with your health care provider or diet and nutrition specialist (dietitian) to adjust your eating plan to your individual calorie needs. This information is not intended to replace advice given to you by your health care provider. Make sure you discuss any questions you have with your health care provider. Document Revised: 11/21/2017 Document Reviewed: 12/02/2016 Elsevier Patient Education  2020 [employer].

## 2020-08-23 ENCOUNTER — Encounter: Payer: Self-pay | Admitting: Nurse Practitioner

## 2020-08-23 NOTE — Progress Notes (Signed)
EPIC Encounter for ICM Monitoring  Patient Name: Christine Mcdowell is a 49 y.o. female Date: 08/23/2020 Primary Care Physican: [name], MD Primary Cardiologist:Bensimhon Electrophysiologist:Taylor LastWeight:184lbs  Transmission reviewed.   8/31/2021HeartLogic Heart Failure Index17suggesting the possible start of fluid accumulation since 08/10/2020  Prescribed:  Torsemide20 mg take1tablet (20 mg total)Mon, Wed and Friday.Per Dr Clayborne Dana 08/21/2020 note Can take extra torsemide or metolazone prn as needed  Potassium 20 mEq take2tabletsdaily.  Spironolactone 25 mg take 1 tablet daily.  Metolazone 2.5 mg takeone tablet as needed for weight gain of 5 lbs within 3 days.   Labs: 06/05/2020 Creatinine 1.93, BUN 28, Potassium 3.7, Sodium 139, GFR 29-34 05/01/2020 Creatinine0.81, BUN14, Potassium3.3, Sodium143, GFR>60 04/19/2020 Creatinine0.81, BUN11, Potassium3.2, KDTOIZ124, GFR>60  01/31/2020 Creatinine0.88, BUN13, Potassium3.4, Sodium141, GFR>60 A complete set of results can be found in Results Review.  Recommendations:No changes.  Patient seen in office 08/21/2020  Follow-up plan: ICM clinic phone appointment on9/27/2021. 91 day device clinic remote transmission9/07/2020.   EP/Cardiology next office visit: 08/31/2020 withDr. Lovena Le   Copy of ICM check sent to Dr. Lovena Le   3 month ICM trend: 08/22/2020     [name], RN 08/23/2020 5:08 PM

## 2020-08-24 ENCOUNTER — Ambulatory Visit: Payer: Self-pay

## 2020-08-24 ENCOUNTER — Telehealth: Payer: Medicare Other

## 2020-08-24 ENCOUNTER — Other Ambulatory Visit: Payer: Self-pay

## 2020-08-24 DIAGNOSIS — I10 Essential (primary) hypertension: Secondary | ICD-10-CM

## 2020-08-24 DIAGNOSIS — E1169 Type 2 diabetes mellitus with other specified complication: Secondary | ICD-10-CM

## 2020-08-24 DIAGNOSIS — I5022 Chronic systolic (congestive) heart failure: Secondary | ICD-10-CM

## 2020-08-25 ENCOUNTER — Telehealth: Payer: Self-pay

## 2020-08-25 DIAGNOSIS — Z794 Long term (current) use of insulin: Secondary | ICD-10-CM | POA: Diagnosis not present

## 2020-08-25 DIAGNOSIS — Z961 Presence of intraocular lens: Secondary | ICD-10-CM | POA: Diagnosis not present

## 2020-08-25 DIAGNOSIS — E119 Type 2 diabetes mellitus without complications: Secondary | ICD-10-CM | POA: Diagnosis not present

## 2020-08-25 DIAGNOSIS — H04123 Dry eye syndrome of bilateral lacrimal glands: Secondary | ICD-10-CM | POA: Diagnosis not present

## 2020-08-25 NOTE — Chronic Care Management (AMB) (Signed)
Chronic Care Management Pharmacy Assistant   Name: Aluna Whiston  MRN: [id number] DOB: 04-Jan-1971  Reason for Encounter: Patient Assistance Coordination  PCP : [name], MD  Allergies:   Allergies  Allergen Reactions  . Hydromorphone Nausea And Vomiting    Other reaction(s): GI Upset (intolerance), Hypertension (intolerance) Raises blood pressure  Other reaction(s): GI Upset (intolerance), Hypertension (intolerance) Raises blood pressure to stroke level  . Iodinated Diagnostic Agents Other (See Comments)    Shuts down kidneys Shuts kidney function down  . Other Other (See Comments) and Anaphylaxis    Spicy foods and seasonings Skin Prep "makes my skin peel off" Paper tape causes skin burns  . Erythromycin Nausea And Vomiting  . Latex Hives  . Tape Other (See Comments)    "Skin burns"  . Mircette [Desogestrel-Ethinyl Estradiol] Nausea And Vomiting and Rash    Medications: Outpatient Encounter Medications as of 08/25/2020  Medication Sig  . albuterol (ACCUNEB) 1.25 MG/3ML nebulizer solution Take 3 mLs (1.25 mg total) by nebulization every 6 (six) hours as needed for wheezing.  Marland Kitchen aspirin 81 MG chewable tablet Chew 162 mg by mouth daily. Taking 2 tablets  . atorvastatin (LIPITOR) 10 MG tablet Take 1 tablet (10 mg total) by mouth daily.  . Azelastine HCl 137 MCG/SPRAY SOLN Place 2 sprays into the nose daily.  . Bacillus Coagulans-Inulin (PROBIOTIC) 1-250 BILLION-MG CAPS Take 1 capsule by mouth daily.  . blood glucose meter kit and supplies KIT Dispense based on patient and insurance preference. Use to check blood sugars daily E11.9  . carvedilol (COREG) 25 MG tablet TAKE 1 TABLET(25 MG) BY MOUTH TWICE DAILY WITH A MEAL  . Continuous Blood Gluc Receiver (DEXCOM G6 RECEIVER) DEVI 1 Device by Does not apply route 3 (three) times daily before meals.  . Continuous Blood Gluc Sensor (DEXCOM G6 SENSOR) MISC Inject 1 each into the skin 3 (three) times daily.  . Continuous  Blood Gluc Transmit (DEXCOM G6 TRANSMITTER) MISC 1 Device by Does not apply route 3 (three) times daily before meals.  . diclofenac Sodium (VOLTAREN) 1 % GEL APPLY SMALL AMOUNT TO INVOLVED JOINTS UP TO TWICE DAILY  . folic acid (FOLVITE) 1 MG tablet TAKE 1 TABLET BY MOUTH EVERY MORNING  . hydrALAZINE (APRESOLINE) 25 MG tablet Take 3 tablets (75 mg total) by mouth 3 (three) times daily.  . hydroxychloroquine (PLAQUENIL) 200 MG tablet Take 1 tablet (200 mg total) by mouth 2 (two) times daily.  . insulin lispro (HUMALOG) 100 UNIT/ML injection Inject 0.17 mLs (17 Units total) into the skin 3 (three) times daily with meals. Inject 17u three times a day  . ipratropium (ATROVENT) 0.03 % nasal spray USE 2 SPRAYS IN EACH NOSTRIL THREE TIMES DAILY AS NEEDED FOR RHINITIS  . Magnesium 400 MG TABS Take 1 tablet by mouth daily.   . meloxicam (MOBIC) 15 MG tablet Take 15 mg by mouth daily.   . metFORMIN (GLUCOPHAGE) 500 MG tablet TAKE 1 TABLET BY MOUTH EVERY DAY WITH THE MORNING AND EVENING MEAL  . methotrexate (RHEUMATREX) 2.5 MG tablet Take 20 mg by mouth every Friday.   . metolazone (ZAROXOLYN) 2.5 MG tablet TAKE 1 TABLET BY MOUTH AS NEEDED FOR WEIGHT GAIN OF 5 POUNDS WITHIN 3 DAYS AS DIRECTED  . montelukast (SINGULAIR) 10 MG tablet TAKE 1 TABLET BY MOUTH DAILY  . Multiple Vitamin (MULTIVITAMIN WITH MINERALS) TABS Take 1 tablet by mouth every morning.   . ondansetron (ZOFRAN) 4 MG tablet Take 1 tablet (4  mg total) by mouth every 8 (eight) hours as needed for nausea or vomiting.  . potassium chloride SA (KLOR-CON) 20 MEQ tablet Take 2 tablets (40 mEq total) by mouth daily.  . predniSONE (DELTASONE) 10 MG tablet Take 2 tabs daily x 5 days, then 1.5 tabs daily x 5 days, then 1 tab daily x 5 days, then 0.5 tabs daily x 5 days  . PROAIR HFA 108 (90 Base) MCG/ACT inhaler INHALE 2 PUFFS BY MOUTH EVERY DAY AS NEEDED FOR SHORTNESS OF BREATH  . sacubitril-valsartan (ENTRESTO) 97-103 MG Take 1 tablet by mouth 2 (two)  times daily.  Marland Kitchen spironolactone (ALDACTONE) 25 MG tablet TAKE 1 TABLET(25 MG) BY MOUTH TWICE DAILY  . torsemide (DEMADEX) 20 MG tablet Take 1 tablet Monday, Wednesday , and Friday  . traMADol (ULTRAM) 50 MG tablet Take 1 tablet (50 mg total) by mouth every 6 (six) hours as needed.  . traZODone (DESYREL) 50 MG tablet Take 50 mg by mouth at bedtime.  Tyler Aas FLEXTOUCH 100 UNIT/ML FlexTouch Pen Inject 0.14 mLs (14 Units total) into the skin daily.  . Vitamin D, Ergocalciferol, (DRISDOL) 1.25 MG (50000 UNIT) CAPS capsule TAKE 1 CAPSULE BY MOUTH 2 TIMES EVERY WEEK   No facility-administered encounter medications on file as of 08/25/2020.    Current Diagnosis: Patient Active Problem List   Diagnosis Date Noted  . History of COVID-19 04/12/2020  . COVID-19 virus infection 03/07/2020  . Lower abdominal pain 09/07/2019  . Chronic systolic (congestive) heart failure ([place]) 01/28/2018  . Cough 01/19/2018  . Pulmonary infiltrates on CXR 01/19/2018  . Migraines 10/08/2017  . Sleep apnea with use of continuous positive airway pressure (CPAP) 09/24/2017  . Congestive heart failure (CHF) ([place]) 10/09/2016  . Rheumatoid arthritis ([place]) 10/09/2016  . Asthma 10/09/2016  . Chronic pain 10/09/2016  . Heart failure ([place]) 10/09/2016  . Congestive heart failure ([place])   . Lupus (systemic lupus erythematosus) ([place])   . Diabetes mellitus with complication ([place])   . Anxiety state   . GERD (gastroesophageal reflux disease) 12/28/2015  . Essential hypertension 12/26/2015  . Type 2 diabetes mellitus ([place]) 12/26/2015  . Chronic systolic heart failure ([place]) 08/18/2014  . Cardiomyopathy, dilated ([place]) 01/27/2014  . Shortness of breath 01/26/2014  . SLE (systemic lupus erythematosus) ([place]) 01/26/2014     Follow-Up:  Patient Assistance Coordination- Filled out forms for [facility] [place] [employer] to help get patient Dexcom G6 assistance. Faxing over demographics, insurance, and last office note to [place]. Jannette Fogo, CPP aware.  09/04/20- Called [facility] to follow up on on Diabetes supplies, was transferred to new account department but representative informed me that I need to speak with Outsourcing Department but she was unable to transfer me there. Called [employer] back to reach the Outsourcing Department, hold time too long, will retry.   09/18/2020- Judithann Sheen, CPA called [employer] three times until she was able to speak with a representative. Spoke with [place], she was informed that a form was faxed over to providers office and was sent back that glucose monitor was not needed. We will need to send order again with an updated office note, demographics, fax coversheet with documentation that patient will need Dexcom monitor from [facility].   09/19/20- New order, recent office note, demographics, documentation provided that we are ordering Dexcom glucose monitor and that patient does want from [facility] faxed over. Patient will be notified via [place].  Jannette Fogo, CPP notified.  Pattricia Boss, [place] Pharmacist Assistant [phone]

## 2020-08-25 NOTE — Telephone Encounter (Signed)
-----   Message from Cyril Mourning, [facility] sent at 08/24/2020  6:00 PM EDT ----- Regarding: FW: re: needs face to face with you during [facility] I can try to have a face to face with her, but when I talked to her last [name] was supposed to be sending her sensors last month (she told me she talked to them). Have we started paperwork with [facility] yet. ----- Message ----- From: [name], RN Sent: 08/24/2020   4:19 PM EDT To: Cyril Mourning, [place] Subject: re: needs face to face with you during [place],   Can you please try to see Ms. Fick when she comes in for her physical on 9/8 @3  pm. She needs your assistance with ordering a new Dexcom and sensors. She has been out since 8/18 & states you mentioned maybe using a new vendor.   Thank you!  [employer]

## 2020-08-25 NOTE — Chronic Care Management (AMB) (Signed)
Chronic Care Management   Follow Up Note   08/24/2020 Name: Christine Mcdowell MRN: [id number] DOB: 12/01/1971  Referred by: [name], MD Reason for referral : Chronic Care Management (FU RN CM Call )   Christine Mcdowell is a 49 y.o. year old female who is a primary care patient of [name], MD. The CCM team was consulted for assistance with chronic disease management and care coordination needs.    Review of patient status, including review of consultants reports, relevant laboratory and other test results, and collaboration with appropriate care team members and the patient's provider was performed as part of comprehensive patient evaluation and provision of chronic care management services.    SDOH (Social Determinants of Health) assessments performed: Yes - no acute needs identified  See Care Plan activities for detailed interventions related to [place])   Placed outbound CCM RN CM follow up call to patient.     Outpatient Encounter Medications as of 08/24/2020  Medication Sig  . albuterol (ACCUNEB) 1.25 MG/3ML nebulizer solution Take 3 mLs (1.25 mg total) by nebulization every 6 (six) hours as needed for wheezing.  Marland Kitchen aspirin 81 MG chewable tablet Chew 162 mg by mouth daily. Taking 2 tablets  . atorvastatin (LIPITOR) 10 MG tablet Take 1 tablet (10 mg total) by mouth daily.  . Azelastine HCl 137 MCG/SPRAY SOLN Place 2 sprays into the nose daily.  . Bacillus Coagulans-Inulin (PROBIOTIC) 1-250 BILLION-MG CAPS Take 1 capsule by mouth daily.  . blood glucose meter kit and supplies KIT Dispense based on patient and insurance preference. Use to check blood sugars daily E11.9  . carvedilol (COREG) 25 MG tablet TAKE 1 TABLET(25 MG) BY MOUTH TWICE DAILY WITH A MEAL  . Continuous Blood Gluc Receiver (DEXCOM G6 RECEIVER) DEVI 1 Device by Does not apply route 3 (three) times daily before meals.  . Continuous Blood Gluc Sensor (DEXCOM G6 SENSOR) MISC Inject 1 each into the skin 3 (three)  times daily.  . Continuous Blood Gluc Transmit (DEXCOM G6 TRANSMITTER) MISC 1 Device by Does not apply route 3 (three) times daily before meals.  . diclofenac Sodium (VOLTAREN) 1 % GEL APPLY SMALL AMOUNT TO INVOLVED JOINTS UP TO TWICE DAILY  . folic acid (FOLVITE) 1 MG tablet TAKE 1 TABLET BY MOUTH EVERY MORNING  . hydrALAZINE (APRESOLINE) 25 MG tablet Take 3 tablets (75 mg total) by mouth 3 (three) times daily.  . hydroxychloroquine (PLAQUENIL) 200 MG tablet Take 1 tablet (200 mg total) by mouth 2 (two) times daily.  . insulin lispro (HUMALOG) 100 UNIT/ML injection Inject 0.17 mLs (17 Units total) into the skin 3 (three) times daily with meals. Inject 17u three times a day  . ipratropium (ATROVENT) 0.03 % nasal spray USE 2 SPRAYS IN EACH NOSTRIL THREE TIMES DAILY AS NEEDED FOR RHINITIS  . Magnesium 400 MG TABS Take 1 tablet by mouth daily.   . meloxicam (MOBIC) 15 MG tablet Take 15 mg by mouth daily.   . metFORMIN (GLUCOPHAGE) 500 MG tablet TAKE 1 TABLET BY MOUTH EVERY DAY WITH THE MORNING AND EVENING MEAL  . methotrexate (RHEUMATREX) 2.5 MG tablet Take 20 mg by mouth every Friday.   . metolazone (ZAROXOLYN) 2.5 MG tablet TAKE 1 TABLET BY MOUTH AS NEEDED FOR WEIGHT GAIN OF 5 POUNDS WITHIN 3 DAYS AS DIRECTED  . montelukast (SINGULAIR) 10 MG tablet TAKE 1 TABLET BY MOUTH DAILY  . Multiple Vitamin (MULTIVITAMIN WITH MINERALS) TABS Take 1 tablet by mouth every morning.   . ondansetron (  ZOFRAN) 4 MG tablet Take 1 tablet (4 mg total) by mouth every 8 (eight) hours as needed for nausea or vomiting.  . potassium chloride SA (KLOR-CON) 20 MEQ tablet Take 2 tablets (40 mEq total) by mouth daily.  . predniSONE (DELTASONE) 10 MG tablet Take 2 tabs daily x 5 days, then 1.5 tabs daily x 5 days, then 1 tab daily x 5 days, then 0.5 tabs daily x 5 days  . PROAIR HFA 108 (90 Base) MCG/ACT inhaler INHALE 2 PUFFS BY MOUTH EVERY DAY AS NEEDED FOR SHORTNESS OF BREATH  . sacubitril-valsartan (ENTRESTO) 97-103 MG Take 1  tablet by mouth 2 (two) times daily.  Marland Kitchen spironolactone (ALDACTONE) 25 MG tablet TAKE 1 TABLET(25 MG) BY MOUTH TWICE DAILY  . torsemide (DEMADEX) 20 MG tablet Take 1 tablet Monday, Wednesday , and Friday  . traMADol (ULTRAM) 50 MG tablet Take 1 tablet (50 mg total) by mouth every 6 (six) hours as needed.  . traZODone (DESYREL) 50 MG tablet Take 50 mg by mouth at bedtime.  Tyler Aas FLEXTOUCH 100 UNIT/ML FlexTouch Pen Inject 0.14 mLs (14 Units total) into the skin daily.  . Vitamin D, Ergocalciferol, (DRISDOL) 1.25 MG (50000 UNIT) CAPS capsule TAKE 1 CAPSULE BY MOUTH 2 TIMES EVERY WEEK   No facility-administered encounter medications on file as of 08/24/2020.     Objective:  Lab Results  Component Value Date   HGBA1C 6.3 (H) 12/02/2019   HGBA1C 6.4 (H) 06/18/2019   HGBA1C 6.0 (H) 03/29/2019   Lab Results  Component Value Date   MICROALBUR 10 08/24/2019   LDLCALC 88 12/02/2019   CREATININE 1.93 (H) 06/05/2020   BP Readings from Last 3 Encounters:  08/21/20 (!) 144/80  06/05/20 (!) 94/55  04/19/20 (!) 142/88    Goals Addressed      Patient Stated   .  "to get diabetes under good control" (pt-stated)   On track     [place] (see longitudinal plan of care for additional care plan information)  Current Barriers:  Marland Kitchen Knowledge Deficits related to disease process and Self Health management of DM  . Chronic Disease Management support and education needs related to DM, HTN, CHF  Nurse Case Manager Clinical Goal(s):  Marland Kitchen Over the next 30 days, patient will receive a determination from [facility] Medicare regarding approval of new Dexcom G6 scanner Goal Met  . 08/24/20 New Over the next 180 days, patient will work with the CCM team and PCP to address needs related to disease education and support to improve Self Health management of DM  CCM RN CM Interventions:  08/24/20 call completed with patient  . Inter-disciplinary care team collaboration (see longitudinal plan of  care) . Evaluation of current treatment plan related to Diabetes Mellitus and patient's adherence to plan as established by provider. . Provided education to patient re: current A1c of 6.3%; educated on target A1c and target FBS 80-130, <180 after meals Re-educated on dietary & exercise recommendations . Reviewed medications with patient and discussed patient is adhering to her prescribed DM medication regimen: o Metformin $RemoveBef'500mg'[id number]$  with morning and evening meal o Novolog Flexpen up to 17 units three times daily as directed per sliding scale o Tresiba 15 units twice daily (pt says per Dr. Rogers Blocker due to prednisone) . Collaborated with embedded Pharm D via in basket message regarding patient's request for assistance with her Dexcom Rx and sensors . Confirmed patient received the patient educational materials mailed to her home, she denies questions .  Reviewed and discussed next OV f/u for AWV with PCP [name], FNP is scheduled on 08/30/20 @ 3:00 PM  . Discussed plans with patient for ongoing care management follow up and provided patient with direct contact information for care management team  Patient Self Care Activities:  . Self administers medications as prescribed . Attends all scheduled provider appointments . Calls pharmacy for medication refills . Performs ADL's independently . Performs IADL's independently . Calls provider office for new concerns or questions  Please see past updates related to this goal by clicking on the "Past Updates" button in the selected goal        Plan:   Telephone follow up appointment with care management team member scheduled for: 10/16/20  Barb Merino, RN, BSN, CCM Care Management Coordinator [facility] [facility]  Direct Phone: [phone]

## 2020-08-25 NOTE — Patient Instructions (Signed)
Visit Information  Goals Addressed      Patient Stated   .  "to get diabetes under good control" (pt-stated)   On track     [place] (see longitudinal plan of care for additional care plan information)  Current Barriers:  Marland Kitchen Knowledge Deficits related to disease process and Self Health management of DM  . Chronic Disease Management support and education needs related to DM, HTN, CHF  Nurse Case Manager Clinical Goal(s):  Marland Kitchen Over the next 30 days, patient will receive a determination from [facility] Medicare regarding approval of new Dexcom G6 scanner Goal Met  . 08/24/20 New Over the next 180 days, patient will work with the CCM team and PCP to address needs related to disease education and support to improve Self Health management of DM  CCM RN CM Interventions:  08/24/20 call completed with patient  . Inter-disciplinary care team collaboration (see longitudinal plan of care) . Evaluation of current treatment plan related to Diabetes Mellitus and patient's adherence to plan as established by provider. . Provided education to patient re: current A1c of 6.3%; educated on target A1c and target FBS 80-130, <180 after meals Re-educated on dietary & exercise recommendations . Reviewed medications with patient and discussed patient is adhering to her prescribed DM medication regimen: o Metformin $RemoveBef'500mg'otfWSXesgC$  with morning and evening meal o Novolog Flexpen up to 17 units three times daily as directed per sliding scale o Tresiba 15 units twice daily (pt says per Dr. Rogers Blocker due to prednisone) . Collaborated with embedded Pharm D via in basket message regarding patient's request for assistance with her Dexcom Rx and sensors . Confirmed patient received the patient educational materials mailed to her home, she denies questions . Reviewed and discussed next OV f/u for AWV with PCP [name], FNP is scheduled on 08/30/20 @ 3:00 PM  . Discussed plans with patient for ongoing care management follow up and  provided patient with direct contact information for care management team  Patient Self Care Activities:  . Self administers medications as prescribed . Attends all scheduled provider appointments . Calls pharmacy for medication refills . Performs ADL's independently . Performs IADL's independently . Calls provider office for new concerns or questions  Please see past updates related to this goal by clicking on the "Past Updates" button in the selected goal        Patient verbalizes understanding of instructions provided today.   Telephone follow up appointment with care management team member scheduled for: 10/16/20  Barb Merino, RN, BSN, CCM Care Management Coordinator [place] [facility]  Direct Phone: [phone]

## 2020-08-30 ENCOUNTER — Ambulatory Visit (INDEPENDENT_AMBULATORY_CARE_PROVIDER_SITE_OTHER): Payer: Medicare Other | Admitting: Nurse Practitioner

## 2020-08-30 ENCOUNTER — Ambulatory Visit (INDEPENDENT_AMBULATORY_CARE_PROVIDER_SITE_OTHER): Payer: Medicare Other | Admitting: *Deleted

## 2020-08-30 ENCOUNTER — Other Ambulatory Visit: Payer: Self-pay

## 2020-08-30 ENCOUNTER — Encounter: Payer: Self-pay | Admitting: Nurse Practitioner

## 2020-08-30 ENCOUNTER — Ambulatory Visit (INDEPENDENT_AMBULATORY_CARE_PROVIDER_SITE_OTHER): Payer: Medicare Other

## 2020-08-30 ENCOUNTER — Ambulatory Visit: Payer: Self-pay

## 2020-08-30 VITALS — BP 132/76 | HR 84 | Temp 98.4°F | Ht 65.0 in | Wt 189.6 lb

## 2020-08-30 VITALS — BP 136/76 | HR 84 | Temp 98.4°F | Ht 65.0 in | Wt 189.0 lb

## 2020-08-30 DIAGNOSIS — I10 Essential (primary) hypertension: Secondary | ICD-10-CM | POA: Diagnosis not present

## 2020-08-30 DIAGNOSIS — Z0000 Encounter for general adult medical examination without abnormal findings: Secondary | ICD-10-CM | POA: Diagnosis not present

## 2020-08-30 DIAGNOSIS — M329 Systemic lupus erythematosus, unspecified: Secondary | ICD-10-CM

## 2020-08-30 DIAGNOSIS — Z23 Encounter for immunization: Secondary | ICD-10-CM

## 2020-08-30 DIAGNOSIS — Z1231 Encounter for screening mammogram for malignant neoplasm of breast: Secondary | ICD-10-CM

## 2020-08-30 DIAGNOSIS — I420 Dilated cardiomyopathy: Secondary | ICD-10-CM | POA: Diagnosis not present

## 2020-08-30 DIAGNOSIS — M069 Rheumatoid arthritis, unspecified: Secondary | ICD-10-CM

## 2020-08-30 DIAGNOSIS — Z1159 Encounter for screening for other viral diseases: Secondary | ICD-10-CM

## 2020-08-30 DIAGNOSIS — Z95810 Presence of automatic (implantable) cardiac defibrillator: Secondary | ICD-10-CM

## 2020-08-30 DIAGNOSIS — E559 Vitamin D deficiency, unspecified: Secondary | ICD-10-CM

## 2020-08-30 DIAGNOSIS — E785 Hyperlipidemia, unspecified: Secondary | ICD-10-CM | POA: Diagnosis not present

## 2020-08-30 DIAGNOSIS — Z6831 Body mass index (BMI) 31.0-31.9, adult: Secondary | ICD-10-CM

## 2020-08-30 DIAGNOSIS — E1169 Type 2 diabetes mellitus with other specified complication: Secondary | ICD-10-CM

## 2020-08-30 DIAGNOSIS — E6609 Other obesity due to excess calories: Secondary | ICD-10-CM

## 2020-08-30 DIAGNOSIS — Z9109 Other allergy status, other than to drugs and biological substances: Secondary | ICD-10-CM

## 2020-08-30 DIAGNOSIS — I5020 Unspecified systolic (congestive) heart failure: Secondary | ICD-10-CM

## 2020-08-30 LAB — CUP PACEART REMOTE DEVICE CHECK
Battery Remaining Longevity: 156 mo
Battery Remaining Percentage: 100 %
Brady Statistic RV Percent Paced: 0 %
Date Time Interrogation Session: 20210908045000
HighPow Impedance: 79 Ohm
Implantable Lead Implant Date: 20190206
Implantable Lead Location: 753860
Implantable Lead Model: 292
Implantable Lead Serial Number: 438194
Implantable Pulse Generator Implant Date: 20190206
Lead Channel Impedance Value: 636 Ohm
Lead Channel Setting Pacing Amplitude: 2.5 V
Lead Channel Setting Pacing Pulse Width: 0.4 ms
Lead Channel Setting Sensing Sensitivity: 0.5 mV
Pulse Gen Serial Number: 243572

## 2020-08-30 MED ORDER — INSULIN LISPRO 100 UNIT/ML ~~LOC~~ SOLN: SUBCUTANEOUS | 3 refills | Status: DC

## 2020-08-30 NOTE — Chronic Care Management (AMB) (Signed)
Chronic Care Management Pharmacy  Name: Christine Mcdowell  MRN: [id number] DOB: [birth year]  Chief Complaint/ HPI  [name],  49 y.o. , female presents for their Follow-Up CCM visit with the clinical pharmacist In office to discuss Dexcom supplies.  PCP : [name], FNP  Pt requested to speak with PharmD today during office visit with PCP regarding Dexcom CGM supplies. Pt has been receiving supplies through [facility], but has been waiting on delivery since 8/18 and is out of supplies. [name] stated they were missing clinical notes from last visit and a new prescription for supplies. Have sent pt's information to [facility] for them to deliver CGM supplies to pt. Provided pt with a sample of Dexcom while in office today since she is out of sensors. Spoke with PCP to send in clinical notes and new Rx to [facility].   SDOH Interventions     Most Recent Value  SDOH Interventions  Financial Strain Interventions Intervention Not Indicated  [Medications are free]     Goals Addressed            This Visit's Progress   . Pharmacy Care Plan       CARE PLAN ENTRY  Current Barriers:  . Chronic Disease Management support, education, and care coordination needs related to Hypertension, Hyperlipidemia, Diabetes, and Heart Failure   Hypertension . Pharmacist Clinical Goal(s): o Over the next 90 days, patient will work with PharmD and providers to maintain BP goal <130/80 . Current regimen:  o Carvedilol 25mg  twice daily with a meal o Spironolactone 25mg  twice daily o Hydralazine 75mg  three times daily . Interventions: o Recommended increase in physical activity  o Recommended heart healthy diet o Assisted with coordination of delivery of new, decreased dose of hydralazine . Patient self care activities - Over the next 4 weeks, patient will: o Check BP daily, document, and provide at future appointments o Ensure daily salt intake < 2300 mg/day o Exercise 15 minutes daily 5  times per week  Hyperlipidemia . Pharmacist Clinical Goal(s): o Over the next 90 days, patient will work with PharmD and providers to maintain LDL goal < 100 . Current regimen:  o Atorvastatin 10mg  daily . Interventions: o Recommend increase in physical activity . Patient self care activities - Over the next 4 weeks, patient will: o Exercise 15 minutes daily 5 times per week o Limit fried foods and red meats  Diabetes . Pharmacist Clinical Goal(s): o Over the next 90 days, patient will work with PharmD and providers to maintain A1c goal <7% . Current regimen:  o Metformin 500mg  with morning and evening meal o Novolog Flexpen up to 17 units three times daily as directed per sliding scale o Tresiba 15 units daily . Interventions: o Patient never received delivery of Dexcom supplies from [facility] as expected on 8/18 o Sent patient information to [employer] so that patient can receive supplies through that company o Provided patient with Dexcom samples today in office o Collaborated with PCP to send in clinical notes and prescription for Dexcom supplies to [place] o Patient states Tresiba increased to twice daily by Dr. Rogers Blocker. Attempted to verify dose change, but unable to do so . Patient self care activities - Over the next 4 weeks, patient will: o Check blood sugar 3-4 times daily, document, and provide at future appointments o Contact provider with any episodes of hypoglycemia o Exercise 15 minutes daily 5 times per week  Heart Failure . Pharmacist Clinical Goal(s) o Over the  next 42, patient will work with PharmD and providers to prevent weight gain and fluid accumulation . Current regimen:  o Carvedilol 25mg  twice daily with a meal o Spironolactone 25mg  twice daily o Hydralazine 75mg  three times daily o Torsemide 20mg  on Monday, Wednesday, and Friday o Entresto 97-103mg  twice daily o Metolazone 2.5mg  as needed for weight gain of 5lbs within 3 days as  directed . Interventions o Recommended to assess fluid status daily . Patient self care activities - Over the next 90 days, patient will: o Weigh daily, record, and provide at future appointments  Medication management . Pharmacist Clinical Goal(s): o Over the next 90 days, patient will work with PharmD and providers to maintain optimal medication adherence . Current pharmacy: [facility] . Interventions o Comprehensive medication review performed. o Utilize [facility] pharmacy for medication synchronization, packaging and delivery . Patient self care activities - Over the next 4 weeks, patient will: o Focus on medication adherence by transitioning to adherence packaging and medication synchronization o Take medications as prescribed o Report any questions or concerns to PharmD and/or provider(s)  Please see past updates related to this goal by clicking on the "Past Updates" button in the selected goal        Follow up: 1 month phone visit  Jannette Fogo, PharmD Clinical Pharmacist [facility] [phone]

## 2020-08-30 NOTE — Progress Notes (Addendum)
Rutherford Nail as a scribe for [name], FNP.,have documented all relevant documentation on the behalf of [name], FNP,as directed by  [name], FNP while in the presence of [name], [place].  This visit occurred during the SARS-CoV-2 public health emergency.  Safety protocols were in place, including screening questions prior to the visit, additional usage of staff PPE, and extensive cleaning of exam room while observing appropriate contact time as indicated for disinfecting solutions.  Subjective:     Patient ID: Christine Mcdowell , female    DOB: 1971/02/13 , 49 y.o.   MRN: [id number]   Chief Complaint  Patient presents with  . Annual Exam    HPI  Pt presents today for a health maintenance exam   Wt Readings from Last 3 Encounters: 08/30/20 : 189 lb 9.6 oz (86 kg) 08/21/20 : 192 lb 4 oz (87.2 kg) 04/19/20 : 189 lb 6.4 oz (85.9 kg)  She is at 45% of her EF.    She is taking her blood sugar 3-4 times a day.  She has completed a prednisone taper, blood sugar has been increased at night over 300.    Diabetes She presents for her follow-up diabetic visit. She has type 2 diabetes mellitus. Her disease course has been stable. Pertinent negatives for hypoglycemia include no dizziness or headaches. Pertinent negatives for diabetes include no chest pain, no fatigue, no polydipsia, no polyphagia and no polyuria. There are no hypoglycemic complications. There are no diabetic complications. Current diabetic treatment includes oral agent (dual therapy). She is compliant with treatment all of the time. She is following a diabetic and low fat/cholesterol diet. There is no change in her home blood glucose trend. (Blood sugar ranges 91-300, higher at night.) An ACE inhibitor/angiotensin II receptor blocker is being taken. She does not see a podiatrist.Eye exam is current (awaiting records).     Past Medical History:  Diagnosis Date  . AICD (automatic  cardioverter/defibrillator) present 01/28/2018  . Anemia   . Anginal pain ([place])   . Asthma   . Cervical cancer ([place])   . CHF (congestive heart failure) ([place])   . Diabetes mellitus without complication ([place])    steroid induced  . Discoid lupus   . Fibromyalgia   . History of blood transfusion "several"   "related to anemia; had some w/hysterectomy also"  . Hx of cardiovascular stress test    ETT-Myoview (9/15):  No ischemia, EF 52%; NORMAL  . Hx of echocardiogram    Echo (9/15):  EF 50-55%, ant HK, Gr 1 DD, mild MR, mild LAE, no effusion  . Hypertension   . Iron deficiency anemia    h/o iron transfusions  . Lupus (systemic lupus erythematosus) ([place])   . Migraine    "a few/year" (07/03/2016)  . Pneumonia 12/2015  . RA (rheumatoid arthritis) ([place])    "all over" (07/03/2016)  . Sickle cell trait ([place])   . Stroke ([place]) 2014 X 1; 2015 X 2; 2016 X 1;    "right side of face more relaxed than the other; rare speech hesitation" (07/03/2016)  . Vaginal Pap smear, abnormal    ASCUS; HPV     Family History  Problem Relation Age of Onset  . Arthritis Mother   . Heart murmur Mother   . Drug abuse Mother   . Allergies Mother   . Heart attack Father   . Cushing syndrome Father   . Depression Father   . Allergies Father   . Dementia Paternal Grandmother   .  Cancer Paternal Grandfather   . Diabetes Maternal Grandmother   . Hypertension Maternal Grandmother   . Asthma Maternal Grandmother   . Heart attack Maternal Grandfather      Current Outpatient Medications:  .  albuterol (ACCUNEB) 1.25 MG/3ML nebulizer solution, Take 3 mLs (1.25 mg total) by nebulization every 6 (six) hours as needed for wheezing., Disp: 75 mL, Rfl: 12 .  aspirin 81 MG chewable tablet, Chew 162 mg by mouth daily. Taking 2 tablets, Disp: , Rfl:  .  atorvastatin (LIPITOR) 10 MG tablet, Take 1 tablet (10 mg total) by mouth daily., Disp: 90 tablet, Rfl: 2 .  Azelastine HCl 137 MCG/SPRAY SOLN, Place 2 sprays into the  nose daily., Disp: 30 mL, Rfl: 3 .  Bacillus Coagulans-Inulin (PROBIOTIC) 1-250 BILLION-MG CAPS, Take 1 capsule by mouth daily., Disp: 30 capsule, Rfl: 2 .  blood glucose meter kit and supplies KIT, Dispense based on patient and insurance preference. Use to check blood sugars daily E11.9, Disp: 1 each, Rfl: 0 .  carvedilol (COREG) 25 MG tablet, TAKE 1 TABLET(25 MG) BY MOUTH TWICE DAILY WITH A MEAL, Disp: 60 tablet, Rfl: 6 .  Continuous Blood Gluc Receiver (DEXCOM G6 RECEIVER) DEVI, 1 Device by Does not apply route 3 (three) times daily before meals., Disp: 1 each, Rfl: 1 .  Continuous Blood Gluc Sensor (DEXCOM G6 SENSOR) MISC, Inject 1 each into the skin 3 (three) times daily., Disp: 3 each, Rfl: 1 .  Continuous Blood Gluc Transmit (DEXCOM G6 TRANSMITTER) MISC, 1 Device by Does not apply route 3 (three) times daily before meals., Disp: 1 each, Rfl: 1 .  diclofenac Sodium (VOLTAREN) 1 % GEL, APPLY SMALL AMOUNT TO INVOLVED JOINTS UP TO TWICE DAILY, Disp: , Rfl:  .  folic acid (FOLVITE) 1 MG tablet, TAKE 1 TABLET BY MOUTH EVERY MORNING, Disp: 30 tablet, Rfl: 0 .  hydrALAZINE (APRESOLINE) 25 MG tablet, Take 3 tablets (75 mg total) by mouth 3 (three) times daily., Disp: 270 tablet, Rfl: 11 .  hydroxychloroquine (PLAQUENIL) 200 MG tablet, Take 1 tablet (200 mg total) by mouth 2 (two) times daily., Disp: 180 tablet, Rfl: 0 .  insulin lispro (HUMALOG) 100 UNIT/ML injection, Inject insulin according to sliding scale 3 times a day before meals., Disp: 10 mL, Rfl: 3 .  ipratropium (ATROVENT) 0.03 % nasal spray, USE 2 SPRAYS IN EACH NOSTRIL THREE TIMES DAILY AS NEEDED FOR RHINITIS, Disp: 30 mL, Rfl: 2 .  Magnesium 400 MG TABS, Take 1 tablet by mouth daily. , Disp: , Rfl:  .  meloxicam (MOBIC) 15 MG tablet, Take 15 mg by mouth daily. , Disp: , Rfl:  .  metFORMIN (GLUCOPHAGE) 500 MG tablet, TAKE 1 TABLET BY MOUTH EVERY DAY WITH THE MORNING AND EVENING MEAL, Disp: 180 tablet, Rfl: 1 .  methotrexate (RHEUMATREX) 2.5  MG tablet, Take 20 mg by mouth every Friday. , Disp: , Rfl: 3 .  metolazone (ZAROXOLYN) 2.5 MG tablet, TAKE 1 TABLET BY MOUTH AS NEEDED FOR WEIGHT GAIN OF 5 POUNDS WITHIN 3 DAYS AS DIRECTED, Disp: 5 tablet, Rfl: 3 .  montelukast (SINGULAIR) 10 MG tablet, TAKE 1 TABLET BY MOUTH DAILY, Disp: 90 tablet, Rfl: 1 .  Multiple Vitamin (MULTIVITAMIN WITH MINERALS) TABS, Take 1 tablet by mouth every morning. , Disp: , Rfl:  .  ondansetron (ZOFRAN-ODT) 4 MG disintegrating tablet, Take 1 tablet by mouth as needed for nausea., Disp: , Rfl:  .  potassium chloride SA (KLOR-CON) 20 MEQ tablet, Take 2 tablets (  40 mEq total) by mouth daily., Disp: 60 tablet, Rfl: 6 .  predniSONE (DELTASONE) 10 MG tablet, Take 2 tabs daily x 5 days, then 1.5 tabs daily x 5 days, then 1 tab daily x 5 days, then 0.5 tabs daily x 5 days, Disp: , Rfl:  .  PROAIR HFA 108 (90 Base) MCG/ACT inhaler, INHALE 2 PUFFS BY MOUTH EVERY DAY AS NEEDED FOR SHORTNESS OF BREATH, Disp: 18 g, Rfl: 2 .  sacubitril-valsartan (ENTRESTO) 97-103 MG, Take 1 tablet by mouth 2 (two) times daily., Disp: 60 tablet, Rfl: 6 .  spironolactone (ALDACTONE) 25 MG tablet, TAKE 1 TABLET(25 MG) BY MOUTH TWICE DAILY, Disp: 60 tablet, Rfl: 6 .  Tocilizumab (ACTEMRA) 200 MG/10ML SOLN, Inject into the vein every 30 (thirty) days., Disp: , Rfl:  .  torsemide (DEMADEX) 20 MG tablet, Take 1 tablet Monday, Wednesday , and Friday, Disp: 15 tablet, Rfl: 6 .  traMADol (ULTRAM) 50 MG tablet, Take 1 tablet (50 mg total) by mouth every 6 (six) hours as needed., Disp: 20 tablet, Rfl: 0 .  traZODone (DESYREL) 50 MG tablet, Take 50 mg by mouth at bedtime., Disp: , Rfl:  .  TRESIBA FLEXTOUCH 100 UNIT/ML FlexTouch Pen, Inject 0.14 mLs (14 Units total) into the skin daily., Disp: 9 mL, Rfl: 0 .  Vitamin D, Ergocalciferol, (DRISDOL) 1.25 MG (50000 UNIT) CAPS capsule, TAKE 1 CAPSULE BY MOUTH 2 TIMES EVERY WEEK, Disp: 24 capsule, Rfl: 0   Allergies  Allergen Reactions  . Hydromorphone Nausea  And Vomiting    Other reaction(s): GI Upset (intolerance), Hypertension (intolerance) Raises blood pressure  Other reaction(s): GI Upset (intolerance), Hypertension (intolerance) Raises blood pressure to stroke level  . Iodinated Diagnostic Agents Other (See Comments)    Shuts down kidneys Shuts kidney function down  . Other Other (See Comments) and Anaphylaxis    Spicy foods and seasonings Skin Prep "makes my skin peel off" Paper tape causes skin burns  . Erythromycin Nausea And Vomiting  . Latex Hives  . Tape Other (See Comments)    "Skin burns"  . Mircette [Desogestrel-Ethinyl Estradiol] Nausea And Vomiting and Rash     The patient states she is status post hysterectomyfor birth control.  Negative for: breast discharge, breast lump(s), breast pain and breast self exam. Associated symptoms include abnormal vaginal bleeding. Pertinent negatives include abnormal bleeding (hematology), anxiety, decreased libido, depression, difficulty falling sleep, dyspareunia, history of infertility, nocturia, sexual dysfunction, sleep disturbances, urinary incontinence, urinary urgency, vaginal discharge and vaginal itching. Diet: heart healthy, low fat. She is using all natural seasoning she is getting from her garden. She is doing low preservative. The patient states her exercise level is minimal.  Reports she stays active.   The patient's tobacco use is:  Social History   Tobacco Use  Smoking Status Never Smoker  Smokeless Tobacco Never Used   She has been exposed to passive smoke. The patient's alcohol use is:  Social History   Substance and Sexual Activity  Alcohol Use No   Additional information: Last pap hysterectomy   Review of Systems  Constitutional: Negative.  Negative for fatigue.  HENT: Negative.   Eyes: Negative.   Respiratory: Negative.   Cardiovascular: Negative.  Negative for chest pain, palpitations and leg swelling.  Gastrointestinal: Negative.   Endocrine: Negative for  polydipsia, polyphagia and polyuria.  Genitourinary: Negative.   Musculoskeletal: Negative.   Skin: Negative.   Allergic/Immunologic: Positive for environmental allergies.  Neurological: Negative for dizziness, numbness and headaches.  Hematological: Negative.  Psychiatric/Behavioral: Negative.      Today's Vitals   08/30/20 1718  BP: 136/76  Pulse: 84  Temp: 98.4 F (36.9 C)  TempSrc: Oral  Weight: 189 lb (85.7 kg)  Height: $Remove'5\' 5"'NwcbdUj$  (1.651 m)   Body mass index is 31.45 kg/m.   Objective:  Physical Exam Constitutional:      General: She is not in acute distress.    Appearance: Normal appearance. She is well-developed. She is obese.  HENT:     Head: Normocephalic and atraumatic.     Right Ear: Hearing, tympanic membrane, ear canal and external ear normal. There is no impacted cerumen.     Left Ear: Hearing, tympanic membrane, ear canal and external ear normal. There is no impacted cerumen.     Nose:     Comments: Deferred - masked    Mouth/Throat:     Comments: Deferred - masked Eyes:     General: Lids are normal.     Extraocular Movements: Extraocular movements intact.     Conjunctiva/sclera: Conjunctivae normal.     Pupils: Pupils are equal, round, and reactive to light.     Funduscopic exam:    Right eye: No papilledema.        Left eye: No papilledema.  Neck:     Thyroid: No thyroid mass.     Vascular: No carotid bruit.  Cardiovascular:     Rate and Rhythm: Normal rate and regular rhythm.     Pulses: Normal pulses.     Heart sounds: Normal heart sounds. No murmur heard.   Pulmonary:     Effort: Pulmonary effort is normal. No respiratory distress.     Breath sounds: Normal breath sounds.  Abdominal:     General: Abdomen is flat. Bowel sounds are normal. There is no distension.     Palpations: Abdomen is soft.     Tenderness: There is no abdominal tenderness.  Genitourinary:    Rectum: Guaiac result negative.  Musculoskeletal:        General: No swelling.  Normal range of motion.     Cervical back: Full passive range of motion without pain, normal range of motion and neck supple.     Right lower leg: No edema.     Left lower leg: No edema.  Skin:    General: Skin is warm and dry.     Capillary Refill: Capillary refill takes less than 2 seconds.  Neurological:     General: No focal deficit present.     Mental Status: She is alert and oriented to person, place, and time.     Cranial Nerves: No cranial nerve deficit.     Sensory: No sensory deficit.  Psychiatric:        Mood and Affect: Mood normal.        Behavior: Behavior normal.        Thought Content: Thought content normal.        Judgment: Judgment normal.         Assessment And Plan:     1. Health maintenance examination . Behavior modifications discussed and diet history reviewed.   . Pt will continue to exercise regularly and modify diet with low GI, plant based foods and decrease intake of processed foods.  . Recommend intake of daily multivitamin, Vitamin D, and calcium.  . Recommend self breast exam monthly for preventive screenings, as well as recommend immunizations that include influenza, TDAP (up to date) - CBC  2. Encounter for hepatitis C screening test  for low risk patient  Will check Hepatitis C screening due to recent recommendations to screen all adults 18 years and older - Hepatitis C antibody  3. Essential hypertension . B/P is fairly controlled.  . Continue follow up with Cardiology . CMP ordered to check renal function.  . The importance of regular exercise and dietary modification was stressed to the patient.  - CMP14+EGFR  4. Type 2 diabetes mellitus with other specified complication, without long-term current use of insulin (HCC)  Chronic, blood sugars varying, she is using the Dexcom regularly and adhering to her treatment plan  She is testing 4 times a day and as needed  She is injecting insulin at least 3 times a day, she has to adjust her  insulin due to her fluctuating blood sugars.   She is not currently on any prednisone  Continue with current medications  Encouraged to limit intake of sugary foods and drinks  Encouraged to increase physical activity to 150 minutes per week - CBC - Hemoglobin A1c - insulin lispro (HUMALOG) 100 UNIT/ML injection; Inject insulin according to sliding scale 3 times a day before meals.  Dispense: 10 mL; Refill: 3  5. Hyperlipidemia, unspecified hyperlipidemia type  Chronic, continue with current medications  Tolerating well - Lipid panel  6. Vitamin D deficiency  Will check vitamin D level and supplement as needed.     Also encouraged to spend 15 minutes in the sun daily.  - VITAMIN D 25 Hydroxy (Vit-D Deficiency, Fractures)  7. Systemic lupus erythematosus, unspecified SLE type, unspecified organ involvement status ([place])  Chronic, continue with follow up with Rheumatology  8. Rheumatoid arthritis, involving unspecified site, unspecified whether rheumatoid factor present ([place])  Continue with follow up with Rheumatology  9. Class 1 obesity due to excess calories with serious comorbidity and body mass index (BMI) of 31.0 to 31.9 in adult  Chronic  Discussed healthy diet and regular exercise options   Encouraged to exercise at least 150 minutes per week with 2 days of strength training as tolerated  10. Environmental allergies  She has been using the azestaline, oral antihistamine without relief  This has occurred yearly without benefit  She has had a blood allergy test in the past with no known allergies however she has the nasal congestion/nasal irritation  Will refer to allergist for further evaluation - Ambulatory referral to Allergy  11. Systolic congestive heart failure, unspecified HF chronicity (HCC)  Chronic, stable, continue follow up with Dr. Tempie Hoist  12. Presence of automatic implantable cardioverter-defibrillator  Continue follow up with  cardiology   Patient was given opportunity to ask questions. Patient verbalized understanding of the plan and was able to repeat key elements of the plan. All questions were answered to their satisfaction.   Teola Bradley, FNP, have reviewed all documentation for this visit. The documentation on 09/01/20 for the exam, diagnosis, procedures, and orders are all accurate and complete.   THE PATIENT IS ENCOURAGED TO PRACTICE SOCIAL DISTANCING DUE TO THE COVID-19 PANDEMIC.

## 2020-08-30 NOTE — Progress Notes (Signed)
This visit occurred during the SARS-CoV-2 public health emergency.  Safety protocols were in place, including screening questions prior to the visit, additional usage of staff PPE, and extensive cleaning of exam room while observing appropriate contact time as indicated for disinfecting solutions.  Subjective:   Christine Mcdowell is a 49 y.o. female who presents for Medicare Annual (Subsequent) preventive examination.  Review of Systems     Cardiac Risk Factors include: diabetes mellitus;hypertension;sedentary lifestyle     Objective:    Today's Vitals   08/30/20 1513  BP: 132/76  Pulse: 84  Temp: 98.4 F (36.9 C)  TempSrc: Oral  Weight: 189 lb 9.6 oz (86 kg)  Height: _0  (1.651 m)   Body mass index is 31.55 kg/m.  Advanced Directives 08/30/2020 01/31/2020 11/22/2019 09/11/2019 08/24/2019 08/14/2019 07/05/2019  Does Patient Have a Medical Advance Directive? Yes _1  No  Type of Advance Directive Out of facility DNR (pink MOST or yellow form) - - - - - -  Does patient want to make changes to medical advance directive? - - - - - - -  Copy of [place] in Chart? - - - - - - -  Would patient like information on creating a medical advance directive? - - - No - Patient declined - No - Patient declined -  Some encounter information is confidential and restricted. Go to Review Flowsheets activity to see all data.    Current Medications (verified) Outpatient Encounter Medications as of 08/30/2020  Medication Sig  . albuterol (ACCUNEB) 1.25 MG/3ML nebulizer solution Take 3 mLs (1.25 mg total) by nebulization every 6 (six) hours as needed for wheezing.  Marland Kitchen aspirin 81 MG chewable tablet Chew 162 mg by mouth daily. Taking 2 tablets  . atorvastatin (LIPITOR) 10 MG tablet Take 1 tablet (10 mg total) by mouth daily.  . Bacillus Coagulans-Inulin (PROBIOTIC) 1-250 BILLION-MG CAPS Take 1 capsule by mouth daily.  . blood glucose meter kit and supplies KIT Dispense based on  patient and insurance preference. Use to check blood sugars daily E11.9  . carvedilol (COREG) 25 MG tablet TAKE 1 TABLET(25 MG) BY MOUTH TWICE DAILY WITH A MEAL  . folic acid (FOLVITE) 1 MG tablet TAKE 1 TABLET BY MOUTH EVERY MORNING  . hydrALAZINE (APRESOLINE) 25 MG tablet Take 3 tablets (75 mg total) by mouth 3 (three) times daily.  . hydroxychloroquine (PLAQUENIL) 200 MG tablet Take 1 tablet (200 mg total) by mouth 2 (two) times daily.  . insulin lispro (HUMALOG) 100 UNIT/ML injection Inject 0.17 mLs (17 Units total) into the skin 3 (three) times daily with meals. Inject 17u three times a day  . Magnesium 400 MG TABS Take 1 tablet by mouth daily.   . meloxicam (MOBIC) 15 MG tablet Take 15 mg by mouth daily.   . metFORMIN (GLUCOPHAGE) 500 MG tablet TAKE 1 TABLET BY MOUTH EVERY DAY WITH THE MORNING AND EVENING MEAL  . methotrexate (RHEUMATREX) 2.5 MG tablet Take 20 mg by mouth every Friday.   . metolazone (ZAROXOLYN) 2.5 MG tablet TAKE 1 TABLET BY MOUTH AS NEEDED FOR WEIGHT GAIN OF 5 POUNDS WITHIN 3 DAYS AS DIRECTED  . montelukast (SINGULAIR) 10 MG tablet TAKE 1 TABLET BY MOUTH DAILY  . Multiple Vitamin (MULTIVITAMIN WITH MINERALS) TABS Take 1 tablet by mouth every morning.   . ondansetron (ZOFRAN) 4 MG tablet Take 1 tablet (4 mg total) by mouth every 8 (eight) hours as needed for nausea or vomiting.  Marland Kitchen  potassium chloride SA (KLOR-CON) 20 MEQ tablet Take 2 tablets (40 mEq total) by mouth daily.  Marland Kitchen PROAIR HFA 108 (90 Base) MCG/ACT inhaler INHALE 2 PUFFS BY MOUTH EVERY DAY AS NEEDED FOR SHORTNESS OF BREATH  . sacubitril-valsartan (ENTRESTO) 97-103 MG Take 1 tablet by mouth 2 (two) times daily.  Marland Kitchen spironolactone (ALDACTONE) 25 MG tablet TAKE 1 TABLET(25 MG) BY MOUTH TWICE DAILY  . Tocilizumab (ACTEMRA) 200 MG/10ML SOLN Inject into the vein every 30 (thirty) days.  Marland Kitchen torsemide (DEMADEX) 20 MG tablet Take 1 tablet Monday, Wednesday , and Friday  . traMADol (ULTRAM) 50 MG tablet Take 1 tablet (50 mg  total) by mouth every 6 (six) hours as needed.  . traZODone (DESYREL) 50 MG tablet Take 50 mg by mouth at bedtime.  Tyler Aas FLEXTOUCH 100 UNIT/ML FlexTouch Pen Inject 0.14 mLs (14 Units total) into the skin daily.  . Vitamin D, Ergocalciferol, (DRISDOL) 1.25 MG (50000 UNIT) CAPS capsule TAKE 1 CAPSULE BY MOUTH 2 TIMES EVERY WEEK  . Azelastine HCl 137 MCG/SPRAY SOLN Place 2 sprays into the nose daily. (Patient not taking: Reported on 08/30/2020)  . Continuous Blood Gluc Receiver (DEXCOM G6 RECEIVER) DEVI 1 Device by Does not apply route 3 (three) times daily before meals. (Patient not taking: Reported on 08/30/2020)  . Continuous Blood Gluc Sensor (DEXCOM G6 SENSOR) MISC Inject 1 each into the skin 3 (three) times daily. (Patient not taking: Reported on 08/30/2020)  . Continuous Blood Gluc Transmit (DEXCOM G6 TRANSMITTER) MISC 1 Device by Does not apply route 3 (three) times daily before meals. (Patient not taking: Reported on 08/30/2020)  . diclofenac Sodium (VOLTAREN) 1 % GEL APPLY SMALL AMOUNT TO INVOLVED JOINTS UP TO TWICE DAILY (Patient not taking: Reported on 08/30/2020)  . ipratropium (ATROVENT) 0.03 % nasal spray USE 2 SPRAYS IN EACH NOSTRIL THREE TIMES DAILY AS NEEDED FOR RHINITIS (Patient not taking: Reported on 08/30/2020)  . predniSONE (DELTASONE) 10 MG tablet Take 2 tabs daily x 5 days, then 1.5 tabs daily x 5 days, then 1 tab daily x 5 days, then 0.5 tabs daily x 5 days (Patient not taking: Reported on 08/30/2020)   No facility-administered encounter medications on file as of 08/30/2020.    Allergies (verified) Hydromorphone, Iodinated diagnostic agents, Other, Erythromycin, Latex, Tape, and Mircette [desogestrel-ethinyl estradiol]   History: Past Medical History:  Diagnosis Date  . AICD (automatic cardioverter/defibrillator) present 01/28/2018  . Anemia   . Anginal pain ([place])   . Asthma   . Cervical cancer ([place])   . CHF (congestive heart failure) ([place])   . 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 Discoid lupus   . Fibromyalgia   . History of blood transfusion "several"   "related to anemia; had some w/hysterectomy also"  . Hx of cardiovascular stress test    ETT-Myoview (9/15):  No ischemia, EF 52%; NORMAL  . Hx of echocardiogram    Echo (9/15):  EF 50-55%, ant HK, Gr 1 DD, mild MR, mild LAE, no effusion  . Hypertension   . Iron deficiency anemia    h/o iron transfusions  . Lupus (systemic lupus erythematosus) ([place])   . Migraine    "a few/year" (07/03/2016)  . Pneumonia 12/2015  . RA (rheumatoid arthritis) ([place])    "all over" (07/03/2016)  . Sickle cell trait ([place])   . Stroke ([place]) 2014 X 1; 2015 X 2; 2016 X 1;    "right side of face more relaxed than the  other; rare speech hesitation" (07/03/2016)  . Vaginal Pap smear, abnormal    ASCUS; HPV   Past Surgical History:  Procedure Laterality Date  . ABDOMINAL HYSTERECTOMY  2009  . ABDOMINAL WOUND DEHISCENCE  2009  . BUNIONECTOMY Left 06/15/2020  . CARDIAC CATHETERIZATION N/A 10/11/2016   Procedure: Right/Left Heart Cath and Coronary Angiography;  Surgeon: Jolaine Artist, MD;  Location: [facility];  Service: Cardiovascular;  Laterality: N/A;  . DILATION AND CURETTAGE OF UTERUS  1991  . HEMATOMA EVACUATION  2009   abdomen  . ICD IMPLANT  01/28/2018  . ICD IMPLANT N/A 01/28/2018   Procedure: ICD IMPLANT;  Surgeon: Evans Lance, MD;  Location: [facility];  Service: Cardiovascular;  Laterality: N/A;  . INCISE AND DRAIN ABCESS  2009 X 2   "abdomen after hysterectomy"  . KNEE ARTHROSCOPY Right 1997  . KNEE SURGERY Right   . TUBAL LIGATION  1996   Family History  Problem Relation Age of Onset  . Arthritis Mother   . Heart murmur Mother   . Drug abuse Mother   . Allergies Mother   . Heart attack Father   . Cushing syndrome Father   . Depression Father   . Allergies Father   . Dementia Paternal Grandmother   . Cancer Paternal Grandfather   . Diabetes Maternal  Grandmother   . Hypertension Maternal Grandmother   . Asthma Maternal Grandmother   . Heart attack Maternal Grandfather    Social History   Socioeconomic History  . Marital status: Divorced    Spouse name: Not on file  . Number of children: Not on file  . Years of education: Not on file  . Highest education level: Not on file  Occupational History  . Not on file  Tobacco Use  . Smoking status: Never Smoker  . Smokeless tobacco: Never Used  Vaping Use  . Vaping Use: Never used  Substance and Sexual Activity  . Alcohol use: No  . Drug use: No  . Sexual activity: Not Currently    Birth control/protection: Surgical    Comment: hyst  Other Topics Concern  . Not on file  Social History Narrative  . Not on file   Social Determinants of Health   Financial Resource Strain: Medium Risk  . Difficulty of Paying Living Expenses: Somewhat hard  Food Insecurity: No Food Insecurity  . Worried About Charity fundraiser in the Last Year: Never true  . Ran Out of Food in the Last Year: Never true  Transportation Needs: No Transportation Needs  . Lack of Transportation (Medical): No  . Lack of Transportation (Non-Medical): No  Physical Activity: Inactive  . Days of Exercise per Week: 0 days  . Minutes of Exercise per Session: 0 min  Stress: Stress Concern Present  . Feeling of Stress : Very much  Social Connections:   . Frequency of Communication with Friends and Family: Not on file  . Frequency of Social Gatherings with Friends and Family: Not on file  . Attends Religious Services: Not on file  . Active Member of Clubs or Organizations: Not on file  . Attends Archivist Meetings: Not on file  . Marital Status: Not on file    Tobacco Counseling Counseling given: Not Answered   Clinical Intake:  Pre-visit preparation completed: Yes  Pain : No/denies pain     Nutritional Status: BMI > 30  Obese Nutritional Risks: None Diabetes: Yes  How often do you need  to  have someone help you when you read instructions, pamphlets, or other written materials from your doctor or pharmacy?: 1 - Never What is the last grade level you completed in school?: some college  Diabetic? Yes Nutrition Risk Assessment:  Has the patient had any N/V/D within the last 2 months?  No  Does the patient have any non-healing wounds?  No  Has the patient had any unintentional weight loss or weight gain?  No   Diabetes:  Is the patient diabetic?  Yes  If diabetic, was a CBG obtained today?  No  Did the patient bring in their glucometer from home?  No  How often do you monitor your CBG's? 4-5 times daily.   Financial Strains and Diabetes Management:  Are you having any financial strains with the device, your supplies or your medication? Yes .  Does the patient want to be seen by Chronic Care Management for management of their diabetes?  No  Would the patient like to be referred to a Nutritionist or for Diabetic Management?  No   Diabetic Exams:  Diabetic Eye Exam: Patient had eye exam on 06/2020, results pending. Diabetic Foot Exam: Completed today   Interpreter Needed?: No  Information entered by :: NAllen LPN   Activities of Daily Living In your present state of health, do you have any difficulty performing the following activities: 08/30/2020  Hearing? N  Vision? Y  Comment severe dry eyes, hard to see up close  Difficulty concentrating or making decisions? Y  Comment remembering  Walking or climbing stairs? Y  Comment sometimes  Dressing or bathing? N  Doing errands, shopping? N  Preparing Food and eating ? N  Using the Toilet? N  In the past six months, have you accidently leaked urine? N  Do you have problems with loss of bowel control? N  Managing your Medications? N  Managing your Finances? N  Housekeeping or managing your Housekeeping? N  Some recent data might be hidden    Patient Care Team: [name], MD as PCP - General (Internal  Medicine) Bensimhon, Shaune Pascal, MD as PCP - Cardiology (Cardiology) Evans Lance, MD as PCP - Electrophysiology (Cardiology) Lendon Colonel, NP as Nurse Practitioner (Nurse Practitioner) Daneen Schick as Social Worker Little, [name], RN as Case Manager Caudill, Kennieth Francois, [facility] (Pharmacist)  Indicate any recent Medical Services you may have received from other than [facility] providers in the past year (date may be approximate).     Assessment:   This is a routine wellness examination for Darianna.  Hearing/Vision screen  Hearing Screening   _0  _1  _2  _3  _4  _5  _6  _7  _8   Right ear:           Left ear:           Vision Screening Comments: Regular eye exams, [facility], Dr. Hassell Done  Dietary issues and exercise activities discussed: Current Exercise Habits: The patient does not participate in regular exercise at present  Goals    .  "I am having a Lupus flare and may be trying to have CHF" (pt-stated)      Current Barriers:  Marland Kitchen Knowledge Deficits related to diagnosis and treatment of CHF . Chronic Disease Management support and education needs related to DM, HTN, CHF, Lupus . Knowledge Deficits related to disease process and Self Health management of Lupus  Nurse Case Manager Clinical Goal(s):  Marland Kitchen Over the next 14 days, patient will work with PCP and or Cardiology to  address needs related to symptoms suggestive of Lupus and or CHF   Goal Complete . Over the next 30 days, patient will keep all MD follow up appointments, she will adhere to taking all prescribed medications and follow all MD recommendations for symptoms management of CHF and Lupus Goal Met . New 04/10/20 Over the next 90 days, patient will demonstrate increased knowledge and understanding of how to Self manage CHF as evidence, patient will experience no ER visits and or IP admissions related to CHF   CCM RN CM Interventions:  04/10/20 call completed with patient  . Evaluation of current  treatment plan related to CHF and patient's adherence to plan as established by provider. . Provided education to patient re: disease process and Self Health management of CHF; Reviewed and discussed patient recent IP post d/c orders and recommendations for follow up: Determined patient is experiencing profuse sweating and would like to have this further evaluated . Reviewed medications with patient and discussed indication, dosage and frequency of prescribed medications for CHF; Determined patient is adherent with taking her medications as prescribed w/o missed doses; She denies noted SE and or financial hardship . Collaborated with PCP provider [name], FNP  regarding patient's c/o profuse sweating and need for Vitamin D check; patient added to PCP scheduled for 04/12/20 _0 :15 PM  . Discussed plans with patient for ongoing care management follow up and provided patient with direct contact information for care management team . Provided patient with printed educational materials related to CHF Safety Zone Tool . Advised patient, providing education and rationale, to weigh daily and record, calling the CCM RN and or MD for weight gain of 3lbs overnight or 5 pounds in a week.   Patient Self Care Activities:  . Self administers medications as prescribed . Attends all scheduled provider appointments . Calls pharmacy for medication refills . Performs ADL's independently . Performs IADL's independently . Calls provider office for new concerns or questions  Please see past updates related to this goal by clicking on the "Past Updates" button in the selected goal      .  "I am not taking any maintenance medications for my Asthma" (pt-stated)      Current Barriers:  Marland Kitchen Knowledge Deficits related to best treatment options for maintenance management of Asthma  . Chronic Disease Management support and education needs related to DM, HTN, CHF, Lupus, Asthma  Nurse Case Manager Clinical Goal(s):  Marland Kitchen New  04/10/20 Over the next 30 days, patient will verbalize understanding of plan for f/u with [facility] for evaluation and treatment of Asthma  . New 04/10/20 Over the next 90 days, patient will demonstrate a decrease in Asthma exacerbations as evidenced by patient will experience no ED visits and or IP admissions secondary to Asthma exacerbation  CCM RN CM Interventions:  04/10/20 call completed with patient   . Evaluation of current treatment plan related to Asthma and patient's adherence to plan as established by provider. . Provided education to patient re: disease process and best treatment recommendations for management of Asthma; reviewed and discussed post d/c instructions following IP admit for Asthma exacerbation and CHF . Reviewed medications with patient and discussed patient is not taking a maintenance corticosteroid inhaler at this time; Determined she uses Albuterol as needed  . Collaborated with [facility] regarding recent referral for patient re-establishment wit h a new provider; advised patient prefers not to see Dr. Melvyn Novas; was advised the referral was received and the office will reach out  to Ms. Brosh to schedule a new patient appointment  . Discussed plans with patient for ongoing care management follow up and provided patient with direct contact information for care management team . Provided patient with printed educational materials related to What Do You Want to Know About Asthma?  Patient Self Care Activities:  . Self administers medications as prescribed . Calls pharmacy for medication refills . Performs ADL's independently . Performs IADL's independently . Calls provider office for new concerns or questions  Please see past updates related to this goal by clicking on the "Past Updates" button in the selected goal      .  "to get diabetes under good control" (pt-stated)      CARE PLAN ENTRY (see longitudinal plan of care for additional care plan  information)  Current Barriers:  Marland Kitchen Knowledge Deficits related to disease process and Self Health management of DM  . Chronic Disease Management support and education needs related to DM, HTN, CHF  Nurse Case Manager Clinical Goal(s):  Marland Kitchen Over the next 30 days, patient will receive a determination from [facility] Medicare regarding approval of new Dexcom G6 scanner Goal Met  . 08/24/20 New Over the next 180 days, patient will work with the CCM team and PCP to address needs related to disease education and support to improve Self Health management of DM  CCM RN CM Interventions:  08/24/20 call completed with patient  . Inter-disciplinary care team collaboration (see longitudinal plan of care) . Evaluation of current treatment plan related to Diabetes Mellitus and patient's adherence to plan as established by provider. . Provided education to patient re: current A1c of 6.3%; educated on target A1c and target FBS 80-130, <180 after meals Re-educated on dietary & exercise recommendations . Reviewed medications with patient and discussed patient is adhering to her prescribed DM medication regimen: o Metformin 574m with morning and evening meal o Novolog Flexpen up to 17 units three times daily as directed per sliding scale o Tresiba 15 units twice daily (pt says per Dr. WRogers Blockerdue to prednisone) . Collaborated with embedded Pharm D via in basket message regarding patient's request for assistance with her Dexcom Rx and sensors . Confirmed patient received the patient educational materials mailed to her home, she denies questions . Reviewed and discussed next OV f/u for [facility] with PCP JMinette Brine FNP is scheduled on 08/30/20 @ 3:00 PM  . Discussed plans with patient for ongoing care management follow up and provided patient with direct contact information for care management team  Patient Self Care Activities:  . Self administers medications as prescribed . Attends all scheduled provider appointments .  Calls pharmacy for medication refills . Performs ADL's independently . Performs IADL's independently . Calls provider office for new concerns or questions  Please see past updates related to this goal by clicking on the "Past Updates" button in the selected goal      .  pain control and diabetes control      She would like to have less pain related to her lupus and RA.   She would like to have better glucose control with her prednisone    .  Patient Stated      08/30/2020, stay out of the hospital    .  Pharmacy Care Plan      CARE PLAN ENTRY  Current Barriers:  . Chronic Disease Management support, education, and care coordination needs related to Hypertension, Hyperlipidemia, Diabetes, and Heart Failure   Hypertension . Pharmacist Clinical  Goal(s): o Over the next 90 days, patient will work with PharmD and providers to maintain BP goal <130/80 . Current regimen:  o Carvedilol 15m twice daily with a meal o Spironolactone 242mtwice daily o Hydralazine 7559mhree times daily . Interventions: o Recommended increase in physical activity  o Recommended heart healthy diet o Assisted with coordination of delivery of new, decreased dose of hydralazine . Patient self care activities - Over the next 4 weeks, patient will: o Check BP daily, document, and provide at future appointments o Ensure daily salt intake < 2300 mg/day o Exercise 15 minutes daily 5 times per week  Hyperlipidemia . Pharmacist Clinical Goal(s): o Over the next 90 days, patient will work with PharmD and providers to maintain LDL goal < 100 . Current regimen:  o Atorvastatin 75m49mily . Interventions: o Recommend increase in physical activity . Patient self care activities - Over the next 4 weeks, patient will: o Exercise 15 minutes daily 5 times per week o Limit fried foods and red meats  Diabetes . Pharmacist Clinical Goal(s): o Over the next 90 days, patient will work with PharmD and providers to  maintain A1c goal <7% . Current regimen:  o Metformin 500mg53mh morning and evening meal o Novolog Flexpen up to 17 units three times daily as directed per sliding scale o Tresiba 15 units daily . Interventions: o Verified Dexcom supplies scheduled to be delivered to patient this month via [employer] for supplies though [facility] in the future o Patient states Tresiba increased to twice daily by Dr. WolfeRogers Blockerempted to verify dose change, but unable to do so . Patient self care activities - Over the next 4 weeks, patient will: o Check blood sugar 3-4 times daily, document, and provide at future appointments o Contact provider with any episodes of hypoglycemia o Exercise 15 minutes daily 5 times per week  Heart Failure . Pharmacist Clinical Goal(s) o Over the next 90, p53ient will work with PharmD and providers to prevent weight gain and fluid accumulation . Current regimen:  o Carvedilol 25mg 82me daily with a meal o Spironolactone 25mg t52m daily o Hydralazine 75mg th68mtimes daily o Torsemide 20mg on 72may, Wednesday, and Friday o Entresto 97-103mg twic69mily o Metolazone 2.5mg as nee25m for weight gain of 5lbs within 3 days as directed . Interventions o Recommended to assess fluid status daily . Patient self care activities - Over the next 90 days, patient will: o Weigh daily, record, and provide at future appointments  Medication management . Pharmacist Clinical Goal(s): o Over the next 90 days, patient will work with PharmD and providers to maintain optimal medication adherence . Current pharmacy: [facility] . Interventions o Comprehensive medication review performed. o Utilize [facility] for medication synchronization, packaging and delivery . Patient self care activities - Over the next 4 weeks, patient will: o Focus on medication adherence by transitioning to adherence packaging and medication synchronization o Take medications as  prescribed o Report any questions or concerns to PharmD and/or provider(s)  Please see past updates related to this goal by clicking on the "Past Updates" button in the selected goal        Depression Screen PHQ 2/9 Scores 08/30/2020 04/12/2020 11/24/2019 11/24/2019 08/24/2019 06/23/2019 06/23/2019  PHQ - 2 Score 0 0 0 0 0 0 0  PHQ- 9 Score - 4 0 - - 0 -    Fall Risk Fall Risk  08/30/2020 11/24/2019 08/24/2019 06/23/2019 06/02/2019  Falls  in the past year? 1 0 1 0 0  Comment slipped in kitchen, stepped in a hole - - - -  Number falls in past yr: 1 - 0 - -  Injury with Fall? 0 - 0 - -  Risk for fall due to : Medication side effect - - - -  Follow up Falls evaluation completed;Education provided;Falls prevention discussed - - - -    Any stairs in or around the home? Yes  If so, are there any without handrails? Yes  Home free of loose throw rugs in walkways, pet beds, electrical cords, etc? Yes  Adequate lighting in your home to reduce risk of falls? Yes   ASSISTIVE DEVICES UTILIZED TO PREVENT FALLS:  Life alert? No  Use of a cane, walker or w/c? No  Grab bars in the bathroom? Yes  Shower chair or bench in shower? No  Elevated toilet seat or a handicapped toilet? No   TIMED UP AND GO:  Was the test performed? No .    Gait steady and fast without use of assistive device  Cognitive Function:     6CIT Screen 08/30/2020 08/24/2019  What Year? 0 points 0 points  What month? 0 points 0 points  What time? 3 points 0 points  Count back from 20 0 points 0 points  Months in reverse 0 points 0 points  Repeat phrase 0 points 0 points  Total Score 3 0    Immunizations Immunization History  Administered Date(s) Administered  . Influenza, High Dose Seasonal PF 09/02/2018  . Influenza, Seasonal, Injecte, Preservative Fre 11/06/2017  . Influenza,inj,Quad PF,6+ Mos 11/06/2017, 08/24/2019, 08/30/2020  . Influenza-Unspecified 08/08/2017  . PFIZER SARS-COV-2 Vaccination 06/16/2020, 07/07/2020  . Tdap  08/24/2019    TDAP status: Up to date Flu Vaccine status: Completed at today's visit Pneumococcal vaccine status: Up to date Covid-19 vaccine status: Completed vaccines  Qualifies for Shingles Vaccine? Yes   Zostavax completed No   Shingrix Completed?: No.    Education has been provided regarding the importance of this vaccine. Patient has been advised to call insurance company to determine out of pocket expense if they have not yet received this vaccine. Advised may also receive vaccine at local pharmacy or Health Dept. Verbalized acceptance and understanding.  Screening Tests Health Maintenance  Topic Date Due  . Hepatitis C Screening  Never done  . PNEUMOCOCCAL POLYSACCHARIDE VACCINE AGE 780-64 HIGH RISK  Never done  . OPHTHALMOLOGY EXAM  Never done  . PAP SMEAR-Modifier  Never done  . MAMMOGRAM  Never done  . COLONOSCOPY  Never done  . HEMOGLOBIN A1C  06/01/2020  . FOOT EXAM  08/23/2020  . TETANUS/TDAP  08/23/2029  . INFLUENZA VACCINE  Completed  . COVID-19 Vaccine  Completed  . HIV Screening  Completed    Health Maintenance  Health Maintenance Due  Topic Date Due  . Hepatitis C Screening  Never done  . PNEUMOCOCCAL POLYSACCHARIDE VACCINE AGE 780-64 HIGH RISK  Never done  . OPHTHALMOLOGY EXAM  Never done  . PAP SMEAR-Modifier  Never done  . MAMMOGRAM  Never done  . COLONOSCOPY  Never done  . HEMOGLOBIN A1C  06/01/2020  . FOOT EXAM  08/23/2020    Colorectal cancer screening: Gave number for colonoscopy Mammogram status: Ordered today. Pt provided with contact info and advised to call to schedule appt.  Bone Density status: n/a  Lung Cancer Screening: (Low Dose CT Chest recommended if Age 20-80 years, 30 pack-year currently smoking OR have  quit w/in 15years.) does not qualify.   Lung Cancer Screening Referral: no  Additional Screening:  Hepatitis C Screening: does qualify; Completed today  Vision Screening: Recommended annual ophthalmology exams for early  detection of glaucoma and other disorders of the eye. Is the patient up to date with their annual eye exam?  Yes  Who is the provider or what is the name of the office in which the patient attends annual eye exams? [facility] If pt is not established with a provider, would they like to be referred to a provider to establish care? No .   Dental Screening: Recommended annual dental exams for proper oral hygiene  Community Resource Referral / Chronic Care Management: CRR required this visit?  No   CCM required this visit?  No      Plan:     I have personally reviewed and noted the following in the patient's chart:   . Medical and social history . Use of alcohol, tobacco or illicit drugs  . Current medications and supplements . Functional ability and status . Nutritional status . Physical activity . Advanced directives . List of other physicians . Hospitalizations, surgeries, and ER visits in previous 12 months . Vitals . Screenings to include cognitive, depression, and falls . Referrals and appointments  In addition, I have reviewed and discussed with patient certain preventive protocols, quality metrics, and best practice recommendations. A written personalized care plan for preventive services as well as general preventive health recommendations were provided to patient.     Kellie Simmering, LPN   05/24/354   Nurse Notes:

## 2020-08-30 NOTE — Patient Instructions (Addendum)
Visit Information  Goals Addressed            This Visit's Progress   . Pharmacy Care Plan       CARE PLAN ENTRY  Current Barriers:  . Chronic Disease Management support, education, and care coordination needs related to Hypertension, Hyperlipidemia, Diabetes, and Heart Failure   Hypertension . Pharmacist Clinical Goal(s): o Over the next 90 days, patient will work with PharmD and providers to maintain BP goal <130/80 . Current regimen:  o Carvedilol 25mg  twice daily with a meal o Spironolactone 25mg  twice daily o Hydralazine 75mg  three times daily . Interventions: o Recommended increase in physical activity  o Recommended heart healthy diet o Assisted with coordination of delivery of new, decreased dose of hydralazine . Patient self care activities - Over the next 4 weeks, patient will: o Check BP daily, document, and provide at future appointments o Ensure daily salt intake < 2300 mg/day o Exercise 15 minutes daily 5 times per week  Hyperlipidemia . Pharmacist Clinical Goal(s): o Over the next 90 days, patient will work with PharmD and providers to maintain LDL goal < 100 . Current regimen:  o Atorvastatin 10mg  daily . Interventions: o Recommend increase in physical activity . Patient self care activities - Over the next 4 weeks, patient will: o Exercise 15 minutes daily 5 times per week o Limit fried foods and red meats  Diabetes . Pharmacist Clinical Goal(s): o Over the next 90 days, patient will work with PharmD and providers to maintain A1c goal <7% . Current regimen:  o Metformin 500mg  with morning and evening meal o Novolog Flexpen up to 17 units three times daily as directed per sliding scale o Tresiba 15 units daily . Interventions: o Patient never received delivery of Dexcom supplies from [facility] as expected on 8/18 o Sent patient information to [employer] so that patient can receive supplies through that company o Provided patient with Dexcom  samples today in office o Collaborated with PCP to send in clinical notes and prescription for Dexcom supplies to [facility] o Patient states Tresiba increased to twice daily by Dr. Rogers Blocker. Attempted to verify dose change, but unable to do so . Patient self care activities - Over the next 4 weeks, patient will: o Check blood sugar 3-4 times daily, document, and provide at future appointments o Contact provider with any episodes of hypoglycemia o Exercise 15 minutes daily 5 times per week  Heart Failure . Pharmacist Clinical Goal(s) o Over the next 46, patient will work with PharmD and providers to prevent weight gain and fluid accumulation . Current regimen:  o Carvedilol 25mg  twice daily with a meal o Spironolactone 25mg  twice daily o Hydralazine 75mg  three times daily o Torsemide 20mg  on Monday, Wednesday, and Friday o Entresto 97-103mg  twice daily o Metolazone 2.5mg  as needed for weight gain of 5lbs within 3 days as directed . Interventions o Recommended to assess fluid status daily . Patient self care activities - Over the next 90 days, patient will: o Weigh daily, record, and provide at future appointments  Medication management . Pharmacist Clinical Goal(s): o Over the next 90 days, patient will work with PharmD and providers to maintain optimal medication adherence . Current pharmacy: [facility] . Interventions o Comprehensive medication review performed. o Utilize [facility] for medication synchronization, packaging and delivery . Patient self care activities - Over the next 4 weeks, patient will: o Focus on medication adherence by transitioning to adherence packaging and medication synchronization o  Take medications as prescribed o Report any questions or concerns to PharmD and/or provider(s)  Please see past updates related to this goal by clicking on the "Past Updates" button in the selected goal         The patient verbalized understanding of instructions  provided today and agreed to receive a mailed copy of patient instruction and/or educational materials.  Telephone follow up appointment with pharmacy team member scheduled for: 10/02/20 @ 4:15PM  Jannette Fogo, PharmD Clinical Pharmacist [facility] [phone]   Diabetes Mellitus and Nutrition, Adult When you have diabetes (diabetes mellitus), it is very important to have healthy eating habits because your blood sugar (glucose) levels are greatly affected by what you eat and drink. Eating healthy foods in the appropriate amounts, at about the same times every day, can help you:  Control your blood glucose.  Lower your risk of heart disease.  Improve your blood pressure.  Reach or maintain a healthy weight. Every person with diabetes is different, and each person has different needs for a meal plan. Your health care provider may recommend that you work with a diet and nutrition specialist (dietitian) to make a meal plan that is best for you. Your meal plan may vary depending on factors such as:  The calories you need.  The medicines you take.  Your weight.  Your blood glucose, blood pressure, and cholesterol levels.  Your activity level.  Other health conditions you have, such as heart or kidney disease. How do carbohydrates affect me? Carbohydrates, also called carbs, affect your blood glucose level more than any other type of food. Eating carbs naturally raises the amount of glucose in your blood. Carb counting is a method for keeping track of how many carbs you eat. Counting carbs is important to keep your blood glucose at a healthy level, especially if you use insulin or take certain oral diabetes medicines. It is important to know how many carbs you can safely have in each meal. This is different for every person. Your dietitian can help you calculate how many carbs you should have at each meal and for each snack. Foods that contain carbs  include:  Bread, cereal, rice, pasta, and crackers.  Potatoes and corn.  Peas, beans, and lentils.  Milk and yogurt.  Fruit and juice.  Desserts, such as cakes, cookies, ice cream, and candy. How does alcohol affect me? Alcohol can cause a sudden decrease in blood glucose (hypoglycemia), especially if you use insulin or take certain oral diabetes medicines. Hypoglycemia can be a life-threatening condition. Symptoms of hypoglycemia (sleepiness, dizziness, and confusion) are similar to symptoms of having too much alcohol. If your health care provider says that alcohol is safe for you, follow these guidelines:  Limit alcohol intake to no more than 1 drink per day for nonpregnant women and 2 drinks per day for men. One drink equals 12 oz of beer, 5 oz of wine, or 1 oz of hard liquor.  Do not drink on an empty stomach.  Keep yourself hydrated with water, diet soda, or unsweetened iced tea.  Keep in mind that regular soda, juice, and other mixers may contain a lot of sugar and must be counted as carbs. What are tips for following this plan?  Reading food labels  Start by checking the serving size on the "Nutrition Facts" label of packaged foods and drinks. The amount of calories, carbs, fats, and other nutrients listed on the label is based on one serving of the  item. Many items contain more than one serving per package.  Check the total grams (g) of carbs in one serving. You can calculate the number of servings of carbs in one serving by dividing the total carbs by 15. For example, if a food has 30 g of total carbs, it would be equal to 2 servings of carbs.  Check the number of grams (g) of saturated and trans fats in one serving. Choose foods that have low or no amount of these fats.  Check the number of milligrams (mg) of salt (sodium) in one serving. Most people should limit total sodium intake to less than 2,300 mg per day.  Always check the nutrition information of foods labeled  as "low-fat" or "nonfat". These foods may be higher in added sugar or refined carbs and should be avoided.  Talk to your dietitian to identify your daily goals for nutrients listed on the label. Shopping  Avoid buying canned, premade, or processed foods. These foods tend to be high in fat, sodium, and added sugar.  Shop around the outside edge of the grocery store. This includes fresh fruits and vegetables, bulk grains, fresh meats, and fresh dairy. Cooking  Use low-heat cooking methods, such as baking, instead of high-heat cooking methods like deep frying.  Cook using healthy oils, such as olive, canola, or sunflower oil.  Avoid cooking with butter, cream, or high-fat meats. Meal planning  Eat meals and snacks regularly, preferably at the same times every day. Avoid going long periods of time without eating.  Eat foods high in fiber, such as fresh fruits, vegetables, beans, and whole grains. Talk to your dietitian about how many servings of carbs you can eat at each meal.  Eat 4-6 ounces (oz) of lean protein each day, such as lean meat, chicken, fish, eggs, or tofu. One oz of lean protein is equal to: ? 1 oz of meat, chicken, or fish. ? 1 egg. ?  cup of tofu.  Eat some foods each day that contain healthy fats, such as avocado, nuts, seeds, and fish. Lifestyle  Check your blood glucose regularly.  Exercise regularly as told by your health care provider. This may include: ? 150 minutes of moderate-intensity or vigorous-intensity exercise each week. This could be brisk walking, biking, or water aerobics. ? Stretching and doing strength exercises, such as yoga or weightlifting, at least 2 times a week.  Take medicines as told by your health care provider.  Do not use any products that contain nicotine or tobacco, such as cigarettes and e-cigarettes. If you need help quitting, ask your health care provider.  Work with a Social worker or diabetes educator to identify strategies to  manage stress and any emotional and social challenges. Questions to ask a health care provider  Do I need to meet with a diabetes educator?  Do I need to meet with a dietitian?  What number can I call if I have questions?  When are the best times to check my blood glucose? Where to find more information:  American Diabetes Association: diabetes.org  Academy of Nutrition and Dietetics: www.eatright.[employer] of Diabetes and Digestive and Kidney Diseases (NIH): [employer] Summary  A healthy meal plan will help you control your blood glucose and maintain a healthy lifestyle.  Working with a diet and nutrition specialist (dietitian) can help you make a meal plan that is best for you.  Keep in mind that carbohydrates (carbs) and alcohol have immediate effects on your blood glucose levels. It  is important to count carbs and to use alcohol carefully. This information is not intended to replace advice given to you by your health care provider. Make sure you discuss any questions you have with your health care provider. Document Revised: 11/21/2017 Document Reviewed: 01/13/2017 Elsevier Patient Education  2020 [employer].

## 2020-08-30 NOTE — Patient Instructions (Signed)
Christine Mcdowell , Thank you for taking time to come for your Medicare Wellness Visit. I appreciate your ongoing commitment to your health goals. Please review the following plan we discussed and let me know if I can assist you in the future.   Screening recommendations/referrals: Colonoscopy: calling to reschedule Mammogram: ordered Bone Density: n/a Recommended yearly ophthalmology/optometry visit for glaucoma screening and checkup Recommended yearly dental visit for hygiene and checkup  Vaccinations: Influenza vaccine: todaay Pneumococcal vaccine: due Tdap vaccine: 08/24/2019 Shingles vaccine: discussed  Covid-19:  06/16/2020, 07/07/2020  Advanced directives: copy in chart  Conditions/risks identified: none  Next appointment: Follow up in one year for your annual wellness visit.   Preventive Care 40-64 Years, Female Preventive care refers to lifestyle choices and visits with your health care provider that can promote health and wellness. What does preventive care include?  A yearly physical exam. This is also called an annual well check.  Dental exams once or twice a year.  Routine eye exams. Ask your health care provider how often you should have your eyes checked.  Personal lifestyle choices, including:  Daily care of your teeth and gums.  Regular physical activity.  Eating a healthy diet.  Avoiding tobacco and drug use.  Limiting alcohol use.  Practicing safe sex.  Taking low-dose aspirin daily starting at age 60.  Taking vitamin and mineral supplements as recommended by your health care provider. What happens during an annual well check? The services and screenings done by your health care provider during your annual well check will depend on your age, overall health, lifestyle risk factors, and family history of disease. Counseling  Your health care provider may ask you questions about your:  Alcohol use.  Tobacco use.  Drug use.  Emotional  well-being.  Home and relationship well-being.  Sexual activity.  Eating habits.  Work and work Statistician.  Method of birth control.  Menstrual cycle.  Pregnancy history. Screening  You may have the following tests or measurements:  Height, weight, and BMI.  Blood pressure.  Lipid and cholesterol levels. These may be checked every 5 years, or more frequently if you are over 61 years old.  Skin check.  Lung cancer screening. You may have this screening every year starting at age 31 if you have a 30-pack-year history of smoking and currently smoke or have quit within the past 15 years.  Fecal occult blood test (FOBT) of the stool. You may have this test every year starting at age 36.  Flexible sigmoidoscopy or colonoscopy. You may have a sigmoidoscopy every 5 years or a colonoscopy every 10 years starting at age 55.  Hepatitis C blood test.  Hepatitis B blood test.  Sexually transmitted disease (STD) testing.  Diabetes screening. This is done by checking your blood sugar (glucose) after you have not eaten for a while (fasting). You may have this done every 1-3 years.  Mammogram. This may be done every 1-2 years. Talk to your health care provider about when you should start having regular mammograms. This may depend on whether you have a family history of breast cancer.  BRCA-related cancer screening. This may be done if you have a family history of breast, ovarian, tubal, or peritoneal cancers.  Pelvic exam and Pap test. This may be done every 3 years starting at age 60. Starting at age 44, this may be done every 5 years if you have a Pap test in combination with an HPV test.  Bone density scan. This is done  to screen for osteoporosis. You may have this scan if you are at high risk for osteoporosis. Discuss your test results, treatment options, and if necessary, the need for more tests with your health care provider. Vaccines  Your health care provider may recommend  certain vaccines, such as:  Influenza vaccine. This is recommended every year.  Tetanus, diphtheria, and acellular pertussis (Tdap, Td) vaccine. You may need a Td booster every 10 years.  Zoster vaccine. You may need this after age 57.  Pneumococcal 13-valent conjugate (PCV13) vaccine. You may need this if you have certain conditions and were not previously vaccinated.  Pneumococcal polysaccharide (PPSV23) vaccine. You may need one or two doses if you smoke cigarettes or if you have certain conditions. Talk to your health care provider about which screenings and vaccines you need and how often you need them. This information is not intended to replace advice given to you by your health care provider. Make sure you discuss any questions you have with your health care provider. Document Released: 01/05/2016 Document Revised: 08/28/2016 Document Reviewed: 10/10/2015 Elsevier Interactive Patient Education  2017 [place] Prevention in the Home Falls can cause injuries. They can happen to people of all ages. There are many things you can do to make your home safe and to help prevent falls. What can I do on the outside of my home?  Regularly fix the edges of walkways and driveways and fix any cracks.  Remove anything that might make you trip as you walk through a door, such as a raised step or threshold.  Trim any bushes or trees on the path to your home.  Use bright outdoor lighting.  Clear any walking paths of anything that might make someone trip, such as rocks or tools.  Regularly check to see if handrails are loose or broken. Make sure that both sides of any steps have handrails.  Any raised decks and porches should have guardrails on the edges.  Have any leaves, snow, or ice cleared regularly.  Use sand or salt on walking paths during winter.  Clean up any spills in your garage right away. This includes oil or grease spills. What can I do in the bathroom?  Use  night lights.  Install grab bars by the toilet and in the tub and shower. Do not use towel bars as grab bars.  Use non-skid mats or decals in the tub or shower.  If you need to sit down in the shower, use a plastic, non-slip stool.  Keep the floor dry. Clean up any water that spills on the floor as soon as it happens.  Remove soap buildup in the tub or shower regularly.  Attach bath mats securely with double-sided non-slip rug tape.  Do not have throw rugs and other things on the floor that can make you trip. What can I do in the bedroom?  Use night lights.  Make sure that you have a light by your bed that is easy to reach.  Do not use any sheets or blankets that are too big for your bed. They should not hang down onto the floor.  Have a firm chair that has side arms. You can use this for support while you get dressed.  Do not have throw rugs and other things on the floor that can make you trip. What can I do in the kitchen?  Clean up any spills right away.  Avoid walking on wet floors.  Keep items that  you use a lot in easy-to-reach places.  If you need to reach something above you, use a strong step stool that has a grab bar.  Keep electrical cords out of the way.  Do not use floor polish or wax that makes floors slippery. If you must use wax, use non-skid floor wax.  Do not have throw rugs and other things on the floor that can make you trip. What can I do with my stairs?  Do not leave any items on the stairs.  Make sure that there are handrails on both sides of the stairs and use them. Fix handrails that are broken or loose. Make sure that handrails are as long as the stairways.  Check any carpeting to make sure that it is firmly attached to the stairs. Fix any carpet that is loose or worn.  Avoid having throw rugs at the top or bottom of the stairs. If you do have throw rugs, attach them to the floor with carpet tape.  Make sure that you have a light switch at  the top of the stairs and the bottom of the stairs. If you do not have them, ask someone to add them for you. What else can I do to help prevent falls?  Wear shoes that:  Do not have high heels.  Have rubber bottoms.  Are comfortable and fit you well.  Are closed at the toe. Do not wear sandals.  If you use a stepladder:  Make sure that it is fully opened. Do not climb a closed stepladder.  Make sure that both sides of the stepladder are locked into place.  Ask someone to hold it for you, if possible.  Clearly mark and make sure that you can see:  Any grab bars or handrails.  First and last steps.  Where the edge of each step is.  Use tools that help you move around (mobility aids) if they are needed. These include:  Canes.  Walkers.  Scooters.  Crutches.  Turn on the lights when you go into a dark area. Replace any light bulbs as soon as they burn out.  Set up your furniture so you have a clear path. Avoid moving your furniture around.  If any of your floors are uneven, fix them.  If there are any pets around you, be aware of where they are.  Review your medicines with your doctor. Some medicines can make you feel dizzy. This can increase your chance of falling. Ask your doctor what other things that you can do to help prevent falls. This information is not intended to replace advice given to you by your health care provider. Make sure you discuss any questions you have with your health care provider. Document Released: 10/05/2009 Document Revised: 05/16/2016 Document Reviewed: 01/13/2015 Elsevier Interactive Patient Education  2017 [employer].

## 2020-08-30 NOTE — Patient Instructions (Signed)
Health Maintenance, Female Adopting a healthy lifestyle and getting preventive care are important in promoting health and wellness. Ask your health care provider about:  The right schedule for you to have regular tests and exams.  Things you can do on your own to prevent diseases and keep yourself healthy. What should I know about diet, weight, and exercise? Eat a healthy diet   Eat a diet that includes plenty of vegetables, fruits, low-fat dairy products, and lean protein.  Do not eat a lot of foods that are high in solid fats, added sugars, or sodium. Maintain a healthy weight Body mass index (BMI) is used to identify weight problems. It estimates body fat based on height and weight. Your health care provider can help determine your BMI and help you achieve or maintain a healthy weight. Get regular exercise Get regular exercise. This is one of the most important things you can do for your health. Most adults should:  Exercise for at least 150 minutes each week. The exercise should increase your heart rate and make you sweat (moderate-intensity exercise).  Do strengthening exercises at least twice a week. This is in addition to the moderate-intensity exercise.  Spend less time sitting. Even light physical activity can be beneficial. Watch cholesterol and blood lipids Have your blood tested for lipids and cholesterol at 51 years of age, then have this test every 5 years. Have your cholesterol levels checked more often if:  Your lipid or cholesterol levels are high.  You are older than 51 years of age.  You are at high risk for heart disease. What should I know about cancer screening? Depending on your health history and family history, you may need to have cancer screening at various ages. This may include screening for:  Breast cancer.  Cervical cancer.  Colorectal cancer.  Skin cancer.  Lung cancer. What should I know about heart disease, diabetes, and high blood  pressure? Blood pressure and heart disease  High blood pressure causes heart disease and increases the risk of stroke. This is more likely to develop in people who have high blood pressure readings, are of African descent, or are overweight.  Have your blood pressure checked: ? Every 3-5 years if you are 18-39 years of age. ? Every year if you are 40 years old or older. Diabetes Have regular diabetes screenings. This checks your fasting blood sugar level. Have the screening done:  Once every three years after age 40 if you are at a normal weight and have a low risk for diabetes.  More often and at a younger age if you are overweight or have a high risk for diabetes. What should I know about preventing infection? Hepatitis B If you have a higher risk for hepatitis B, you should be screened for this virus. Talk with your health care provider to find out if you are at risk for hepatitis B infection. Hepatitis C Testing is recommended for:  Everyone born from 1945 through 1965.  Anyone with known risk factors for hepatitis C. Sexually transmitted infections (STIs)  Get screened for STIs, including gonorrhea and chlamydia, if: ? You are sexually active and are younger than 51 years of age. ? You are older than 51 years of age and your health care provider tells you that you are at risk for this type of infection. ? Your sexual activity has changed since you were last screened, and you are at increased risk for chlamydia or gonorrhea. Ask your health care provider if   you are at risk.  Ask your health care provider about whether you are at high risk for HIV. Your health care provider may recommend a prescription medicine to help prevent HIV infection. If you choose to take medicine to prevent HIV, you should first get tested for HIV. You should then be tested every 3 months for as long as you are taking the medicine. Pregnancy  If you are about to stop having your period (premenopausal) and  you may become pregnant, seek counseling before you get pregnant.  Take 400 to 800 micrograms (mcg) of folic acid every day if you become pregnant.  Ask for birth control (contraception) if you want to prevent pregnancy. Osteoporosis and menopause Osteoporosis is a disease in which the bones lose minerals and strength with aging. This can result in bone fractures. If you are 65 years old or older, or if you are at risk for osteoporosis and fractures, ask your health care provider if you should:  Be screened for bone loss.  Take a calcium or vitamin D supplement to lower your risk of fractures.  Be given hormone replacement therapy (HRT) to treat symptoms of menopause. Follow these instructions at home: Lifestyle  Do not use any products that contain nicotine or tobacco, such as cigarettes, e-cigarettes, and chewing tobacco. If you need help quitting, ask your health care provider.  Do not use street drugs.  Do not share needles.  Ask your health care provider for help if you need support or information about quitting drugs. Alcohol use  Do not drink alcohol if: ? Your health care provider tells you not to drink. ? You are pregnant, may be pregnant, or are planning to become pregnant.  If you drink alcohol: ? Limit how much you use to 0-1 drink a day. ? Limit intake if you are breastfeeding.  Be aware of how much alcohol is in your drink. In the [place] one drink equals one 12 oz bottle of beer (355 mL), one 5 oz glass of wine (148 mL), or one 1 oz glass of hard liquor (44 mL). General instructions  Schedule regular health, dental, and eye exams.  Stay current with your vaccines.  Tell your health care provider if: ? You often feel depressed. ? You have ever been abused or do not feel safe at home. Summary  Adopting a healthy lifestyle and getting preventive care are important in promoting health and wellness.  Follow your health care provider's instructions about healthy  diet, exercising, and getting tested or screened for diseases.  Follow your health care provider's instructions on monitoring your cholesterol and blood pressure. This information is not intended to replace advice given to you by your health care provider. Make sure you discuss any questions you have with your health care provider. Document Revised: 12/02/2018 Document Reviewed: 12/02/2018 Elsevier Patient Education  2020 [employer]  

## 2020-08-31 ENCOUNTER — Encounter: Payer: Self-pay | Admitting: Internal Medicine

## 2020-08-31 ENCOUNTER — Ambulatory Visit (INDEPENDENT_AMBULATORY_CARE_PROVIDER_SITE_OTHER): Payer: Medicare Other | Admitting: Internal Medicine

## 2020-08-31 VITALS — BP 118/68 | HR 99 | Ht 65.0 in | Wt 191.4 lb

## 2020-08-31 DIAGNOSIS — I5022 Chronic systolic (congestive) heart failure: Secondary | ICD-10-CM

## 2020-08-31 DIAGNOSIS — Z95810 Presence of automatic (implantable) cardiac defibrillator: Secondary | ICD-10-CM

## 2020-08-31 DIAGNOSIS — I420 Dilated cardiomyopathy: Secondary | ICD-10-CM

## 2020-08-31 LAB — CBC
Hematocrit: 35.2 % (ref 34.0–46.6)
Hemoglobin: 11.2 g/dL (ref 11.1–15.9)
MCH: 27.6 pg (ref 26.6–33.0)
MCHC: 31.8 g/dL (ref 31.5–35.7)
MCV: 87 fL (ref 79–97)
Platelets: 238 10*3/uL (ref 150–450)
RBC: 4.06 x10E6/uL (ref 3.77–5.28)
RDW: 16.6 % — ABNORMAL HIGH (ref 11.7–15.4)
WBC: 5.1 10*3/uL (ref 3.4–10.8)

## 2020-08-31 LAB — CMP14+EGFR
ALT: 14 IU/L (ref 0–32)
AST: 13 IU/L (ref 0–40)
Albumin/Globulin Ratio: 2.2 (ref 1.2–2.2)
Albumin: 4.7 g/dL (ref 3.8–4.9)
Alkaline Phosphatase: 60 IU/L (ref 48–121)
BUN/Creatinine Ratio: 16 (ref 9–23)
BUN: 16 mg/dL (ref 6–24)
Bilirubin Total: 0.3 mg/dL (ref 0.0–1.2)
CO2: 27 mmol/L (ref 20–29)
Calcium: 9.2 mg/dL (ref 8.7–10.2)
Chloride: 103 mmol/L (ref 96–106)
Creatinine, Ser: 1.02 mg/dL — ABNORMAL HIGH (ref 0.57–1.00)
GFR calc Af Amer: 74 mL/min/{1.73_m2} (ref >59)
GFR calc non Af Amer: 64 mL/min/{1.73_m2} (ref >59)
Globulin, Total: 2.1 g/dL (ref 1.5–4.5)
Glucose: 86 mg/dL (ref 65–99)
Potassium: 3.7 mmol/L (ref 3.5–5.2)
Sodium: 143 mmol/L (ref 134–144)
Total Protein: 6.8 g/dL (ref 6.0–8.5)

## 2020-08-31 LAB — HEMOGLOBIN A1C
Est. average glucose Bld gHb Est-mCnc: 151 mg/dL
Hgb A1c MFr Bld: 6.9 % — ABNORMAL HIGH (ref 4.8–5.6)

## 2020-08-31 LAB — LIPID PANEL
Chol/HDL Ratio: 4.2 ratio (ref 0.0–4.4)
Cholesterol, Total: 196 mg/dL (ref 100–199)
HDL: 47 mg/dL (ref >39)
LDL Chol Calc (NIH): 110 mg/dL — ABNORMAL HIGH (ref 0–99)
Triglycerides: 227 mg/dL — ABNORMAL HIGH (ref 0–149)
VLDL Cholesterol Cal: 39 mg/dL (ref 5–40)

## 2020-08-31 LAB — HEPATITIS C ANTIBODY: Hep C Virus Ab: <0.1 s/co ratio (ref 0.0–0.9)

## 2020-08-31 LAB — VITAMIN D 25 HYDROXY (VIT D DEFICIENCY, FRACTURES): Vit D, 25-Hydroxy: 75.8 ng/mL (ref 30.0–100.0)

## 2020-08-31 NOTE — Patient Instructions (Signed)
Medication Instructions:  Your physician recommends that you continue on your current medications as directed. Please refer to the Current Medication list given to you today.  Labwork: None ordered.  Testing/Procedures: None ordered.  Follow-Up: Your physician wants you to follow-up in: one year with Dr. Lovena Le.   You will receive a reminder letter in the mail two months in advance. If you don't receive a letter, please call our office to schedule the follow-up appointment.  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 This monitoring reduces the number of office visits required to check your device to one time per year. It allows [place] to keep an eye on the functioning of your device to ensure it is working properly. You are scheduled for a device check from home on 09/18/2020. You may send your transmission at any time that day. If you have a wireless device, the transmission will be sent automatically. After your physician reviews your transmission, you will receive a postcard with your next transmission date.  Any Other Special Instructions Will Be Listed Below (If Applicable).  If you need a refill on your cardiac medications before your next appointment, please call your pharmacy.

## 2020-08-31 NOTE — Progress Notes (Signed)
HPI Christine Mcdowell returns today for ongoing evaluation and management of her ICD and chronic systolic heart failure.  She is a very pleasant 49 year old woman, with a history of the above problems status post ICD insertion.  I have not seen her in over 2 years.  The patient has done well in the interim with no hospitalizations.  She denies any ICD therapies.  She denies chest pain or shortness of breath.  She has been compliant with her medical therapy.  She is working as a Psychologist, occupational for her church. Allergies  Allergen Reactions  . Hydromorphone Nausea And Vomiting    Other reaction(s): GI Upset (intolerance), Hypertension (intolerance) Raises blood pressure  Other reaction(s): GI Upset (intolerance), Hypertension (intolerance) Raises blood pressure to stroke level  . Iodinated Diagnostic Agents Other (See Comments)    Shuts down kidneys Shuts kidney function down  . Other Other (See Comments) and Anaphylaxis    Spicy foods and seasonings Skin Prep "makes my skin peel off" Paper tape causes skin burns  . Erythromycin Nausea And Vomiting  . Latex Hives  . Tape Other (See Comments)    "Skin burns"  . Mircette [Desogestrel-Ethinyl Estradiol] Nausea And Vomiting and Rash     Current Outpatient Medications  Medication Sig Dispense Refill  . albuterol (ACCUNEB) 1.25 MG/3ML nebulizer solution Take 3 mLs (1.25 mg total) by nebulization every 6 (six) hours as needed for wheezing. 75 mL 12  . aspirin 81 MG chewable tablet Chew 162 mg by mouth daily. Taking 2 tablets    . atorvastatin (LIPITOR) 10 MG tablet Take 1 tablet (10 mg total) by mouth daily. 90 tablet 2  . Azelastine HCl 137 MCG/SPRAY SOLN Place 2 sprays into the nose daily. 30 mL 3  . Bacillus Coagulans-Inulin (PROBIOTIC) 1-250 BILLION-MG CAPS Take 1 capsule by mouth daily. 30 capsule 2  . blood glucose meter kit and supplies KIT Dispense based on patient and insurance preference. Use to check blood sugars daily E11.9 1 each  0  . carvedilol (COREG) 25 MG tablet TAKE 1 TABLET(25 MG) BY MOUTH TWICE DAILY WITH A MEAL 60 tablet 6  . Continuous Blood Gluc Receiver (DEXCOM G6 RECEIVER) DEVI 1 Device by Does not apply route 3 (three) times daily before meals. 1 each 1  . Continuous Blood Gluc Sensor (DEXCOM G6 SENSOR) MISC Inject 1 each into the skin 3 (three) times daily. 3 each 1  . Continuous Blood Gluc Transmit (DEXCOM G6 TRANSMITTER) MISC 1 Device by Does not apply route 3 (three) times daily before meals. 1 each 1  . diclofenac Sodium (VOLTAREN) 1 % GEL APPLY SMALL AMOUNT TO INVOLVED JOINTS UP TO TWICE DAILY    . folic acid (FOLVITE) 1 MG tablet TAKE 1 TABLET BY MOUTH EVERY MORNING 30 tablet 0  . hydrALAZINE (APRESOLINE) 25 MG tablet Take 3 tablets (75 mg total) by mouth 3 (three) times daily. 270 tablet 11  . hydroxychloroquine (PLAQUENIL) 200 MG tablet Take 1 tablet (200 mg total) by mouth 2 (two) times daily. 180 tablet 0  . insulin lispro (HUMALOG) 100 UNIT/ML injection Inject insulin according to sliding scale 3 times a day before meals. 10 mL 3  . ipratropium (ATROVENT) 0.03 % nasal spray USE 2 SPRAYS IN EACH NOSTRIL THREE TIMES DAILY AS NEEDED FOR RHINITIS 30 mL 2  . Magnesium 400 MG TABS Take 1 tablet by mouth daily.     . meloxicam (MOBIC) 15 MG tablet Take 15 mg by mouth daily.     Marland Kitchen  metFORMIN (GLUCOPHAGE) 500 MG tablet TAKE 1 TABLET BY MOUTH EVERY DAY WITH THE MORNING AND EVENING MEAL 180 tablet 1  . methotrexate (RHEUMATREX) 2.5 MG tablet Take 20 mg by mouth every Friday.   3  . metolazone (ZAROXOLYN) 2.5 MG tablet TAKE 1 TABLET BY MOUTH AS NEEDED FOR WEIGHT GAIN OF 5 POUNDS WITHIN 3 DAYS AS DIRECTED 5 tablet 3  . montelukast (SINGULAIR) 10 MG tablet TAKE 1 TABLET BY MOUTH DAILY 90 tablet 1  . Multiple Vitamin (MULTIVITAMIN WITH MINERALS) TABS Take 1 tablet by mouth every morning.     . potassium chloride SA (KLOR-CON) 20 MEQ tablet Take 2 tablets (40 mEq total) by mouth daily. 60 tablet 6  . predniSONE  (DELTASONE) 10 MG tablet Take 2 tabs daily x 5 days, then 1.5 tabs daily x 5 days, then 1 tab daily x 5 days, then 0.5 tabs daily x 5 days    . PROAIR HFA 108 (90 Base) MCG/ACT inhaler INHALE 2 PUFFS BY MOUTH EVERY DAY AS NEEDED FOR SHORTNESS OF BREATH 18 g 2  . sacubitril-valsartan (ENTRESTO) 97-103 MG Take 1 tablet by mouth 2 (two) times daily. 60 tablet 6  . spironolactone (ALDACTONE) 25 MG tablet TAKE 1 TABLET(25 MG) BY MOUTH TWICE DAILY 60 tablet 6  . Tocilizumab (ACTEMRA) 200 MG/10ML SOLN Inject into the vein every 30 (thirty) days.    Marland Kitchen torsemide (DEMADEX) 20 MG tablet Take 1 tablet Monday, Wednesday , and Friday 15 tablet 6  . traMADol (ULTRAM) 50 MG tablet Take 1 tablet (50 mg total) by mouth every 6 (six) hours as needed. 20 tablet 0  . traZODone (DESYREL) 50 MG tablet Take 50 mg by mouth at bedtime.    Tyler Aas FLEXTOUCH 100 UNIT/ML FlexTouch Pen Inject 0.14 mLs (14 Units total) into the skin daily. 9 mL 0  . Vitamin D, Ergocalciferol, (DRISDOL) 1.25 MG (50000 UNIT) CAPS capsule TAKE 1 CAPSULE BY MOUTH 2 TIMES EVERY WEEK 24 capsule 0  . ondansetron (ZOFRAN-ODT) 4 MG disintegrating tablet Take 1 tablet by mouth as needed for nausea.     No current facility-administered medications for this visit.     Past Medical History:  Diagnosis Date  . AICD (automatic cardioverter/defibrillator) present 01/28/2018  . Anemia   . Anginal pain ([place])   . Asthma   . Cervical cancer ([place])   . CHF (congestive heart failure) ([place])   . 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 Discoid lupus   . Fibromyalgia   . History of blood transfusion "several"   "related to anemia; had some w/hysterectomy also"  . Hx of cardiovascular stress test    ETT-Myoview (9/15):  No ischemia, EF 52%; NORMAL  . Hx of echocardiogram    Echo (9/15):  EF 50-55%, ant HK, Gr 1 DD, mild MR, mild LAE, no effusion  . Hypertension   . Iron deficiency anemia    h/o iron transfusions  . Lupus  (systemic lupus erythematosus) ([place])   . Migraine    "a few/year" (07/03/2016)  . Pneumonia 12/2015  . RA (rheumatoid arthritis) ([place])    "all over" (07/03/2016)  . Sickle cell trait ([place])   . Stroke ([place]) 2014 X 1; 2015 X 2; 2016 X 1;    "right side of face more relaxed than the other; rare speech hesitation" (07/03/2016)  . Vaginal Pap smear, abnormal    ASCUS; HPV    ROS:   All systems reviewed and negative except  as noted in the HPI.   Past Surgical History:  Procedure Laterality Date  . ABDOMINAL HYSTERECTOMY  2009  . ABDOMINAL WOUND DEHISCENCE  2009  . BUNIONECTOMY Left 06/15/2020  . CARDIAC CATHETERIZATION N/A 10/11/2016   Procedure: Right/Left Heart Cath and Coronary Angiography;  Surgeon: Dolores Patty, MD;  Location: [facility] INVASIVE CV LAB;  Service: Cardiovascular;  Laterality: N/A;  . DILATION AND CURETTAGE OF UTERUS  1991  . HEMATOMA EVACUATION  2009   abdomen  . ICD IMPLANT  01/28/2018  . ICD IMPLANT N/A 01/28/2018   Procedure: ICD IMPLANT;  Surgeon: Marinus Maw, MD;  Location: [facility] INVASIVE CV LAB;  Service: Cardiovascular;  Laterality: N/A;  . INCISE AND DRAIN ABCESS  2009 X 2   "abdomen after hysterectomy"  . KNEE ARTHROSCOPY Right 1997  . KNEE SURGERY Right   . TUBAL LIGATION  1996     Family History  Problem Relation Age of Onset  . Arthritis Mother   . Heart murmur Mother   . Drug abuse Mother   . Allergies Mother   . Heart attack Father   . Cushing syndrome Father   . Depression Father   . Allergies Father   . Dementia Paternal Grandmother   . Cancer Paternal Grandfather   . Diabetes Maternal Grandmother   . Hypertension Maternal Grandmother   . Asthma Maternal Grandmother   . Heart attack Maternal Grandfather      Social History   Socioeconomic History  . Marital status: Divorced    Spouse name: Not on file  . Number of children: Not on file  . Years of education: Not on file  . Highest education level: Not on file  Occupational  History  . Not on file  Tobacco Use  . Smoking status: Never Smoker  . Smokeless tobacco: Never Used  Vaping Use  . Vaping Use: Never used  Substance and Sexual Activity  . Alcohol use: No  . Drug use: No  . Sexual activity: Not Currently    Birth control/protection: Surgical    Comment: hyst  Other Topics Concern  . Not on file  Social History Narrative  . Not on file   Social Determinants of Health   Financial Resource Strain: Medium Risk  . Difficulty of Paying Living Expenses: Somewhat hard  Food Insecurity: No Food Insecurity  . Worried About Programme researcher, broadcasting/film/video in the Last Year: Never true  . Ran Out of Food in the Last Year: Never true  Transportation Needs: No Transportation Needs  . Lack of Transportation (Medical): No  . Lack of Transportation (Non-Medical): No  Physical Activity: Inactive  . Days of Exercise per Week: 0 days  . Minutes of Exercise per Session: 0 min  Stress: Stress Concern Present  . Feeling of Stress : Very much  Social Connections:   . Frequency of Communication with Friends and Family: Not on file  . Frequency of Social Gatherings with Friends and Family: Not on file  . Attends Religious Services: Not on file  . Active Member of Clubs or Organizations: Not on file  . Attends Banker Meetings: Not on file  . Marital Status: Not on file  Intimate Partner Violence:   . Fear of Current or Ex-Partner: Not on file  . Emotionally Abused: Not on file  . Physically Abused: Not on file  . Sexually Abused: Not on file     BP 118/68   Pulse 99   Ht 5\' 5"  (1.651  m)   Wt 191 lb 6.4 oz (86.8 kg)   SpO2 99%   BMI 31.85 kg/m   Physical Exam:  Well appearing 49 year old woman, NAD HEENT: Unremarkable Neck: 6 cm JVD, no thyromegally Lymphatics:  No adenopathy Back:  No CVA tenderness Lungs:  Clear, with no wheezes, rales, or rhonchi HEART:  Regular rate rhythm, no murmurs, no rubs, no clicks Abd:  soft, positive bowel sounds,  no organomegally, no rebound, no guarding Ext:  2 plus pulses, no edema, no cyanosis, no clubbing Skin:  No rashes no nodules Neuro:  CN II through XII intact, motor grossly intact  DEVICE  Normal device function.  See PaceArt for details.   Assess/Plan: 1.  Chronic systolic heart failure -her symptoms are class II on guideline directed medical therapy.  She is encouraged to maintain a low-sodium diet. 2.  ICD -her Hart single-chamber ICD is working normally.  She has had no ICD therapy. 3.  Hypertension -her blood pressure is well controlled.  She will continue her current medical therapy.  Cristopher Peru, MD

## 2020-09-01 LAB — CUP PACEART INCLINIC DEVICE CHECK
Date Time Interrogation Session: 20210909173856
HighPow Impedance: 81 Ohm
Implantable Lead Implant Date: 20190206
Implantable Lead Location: 753860
Implantable Lead Model: 292
Implantable Lead Serial Number: 438194
Implantable Pulse Generator Implant Date: 20190206
Lead Channel Impedance Value: 583 Ohm
Lead Channel Pacing Threshold Amplitude: 0.5 V
Lead Channel Pacing Threshold Pulse Width: 0.4 ms
Lead Channel Sensing Intrinsic Amplitude: 25 mV
Lead Channel Setting Pacing Amplitude: 2.5 V
Lead Channel Setting Pacing Pulse Width: 0.4 ms
Lead Channel Setting Sensing Sensitivity: 0.5 mV
Pulse Gen Serial Number: 243572

## 2020-09-01 NOTE — Progress Notes (Signed)
Remote ICD transmission.   

## 2020-09-04 ENCOUNTER — Ambulatory Visit: Payer: Self-pay | Admitting: Nurse Practitioner

## 2020-09-07 DIAGNOSIS — M0579 Rheumatoid arthritis with rheumatoid factor of multiple sites without organ or systems involvement: Secondary | ICD-10-CM | POA: Diagnosis not present

## 2020-09-12 ENCOUNTER — Ambulatory Visit
Admission: RE | Admit: 2020-09-12 | Discharge: 2020-09-12 | Disposition: A | Discharge status: Home / Self Care | Payer: Medicare Other | Source: Ambulatory Visit | Attending: Nurse Practitioner | Admitting: Nurse Practitioner

## 2020-09-12 ENCOUNTER — Other Ambulatory Visit: Payer: Self-pay

## 2020-09-12 DIAGNOSIS — Z1231 Encounter for screening mammogram for malignant neoplasm of breast: Secondary | ICD-10-CM

## 2020-09-14 ENCOUNTER — Other Ambulatory Visit: Payer: Self-pay | Admitting: Nurse Practitioner

## 2020-09-14 DIAGNOSIS — R928 Other abnormal and inconclusive findings on diagnostic imaging of breast: Secondary | ICD-10-CM

## 2020-09-18 ENCOUNTER — Ambulatory Visit (INDEPENDENT_AMBULATORY_CARE_PROVIDER_SITE_OTHER): Payer: Medicare Other

## 2020-09-18 ENCOUNTER — Encounter: Payer: Self-pay | Admitting: Nurse Practitioner

## 2020-09-18 DIAGNOSIS — I5022 Chronic systolic (congestive) heart failure: Secondary | ICD-10-CM

## 2020-09-18 DIAGNOSIS — Z95810 Presence of automatic (implantable) cardiac defibrillator: Secondary | ICD-10-CM

## 2020-09-19 NOTE — Progress Notes (Signed)
EPIC Encounter for ICM Monitoring  Patient Name: Christine Mcdowell is a 49 y.o. female Date: 09/19/2020 Primary Care Physican: [name], MD Primary Cardiologist:Bensimhon Electrophysiologist:Taylor LastWeight:184lbs  Transmission reviewed.  9/26/2021[employer] Heart Failure Index10suggestingnormal fluid levels  Prescribed:  Torsemide20 mg take1tablet (20 mg total)Mon, Wed and Friday.Per Dr Clayborne Dana 08/21/2020 note Can take extra torsemide or metolazone prn as needed  Potassium 20 mEq take2tabletsdaily.  Spironolactone 25 mg take 1 tablet daily.  Metolazone 2.5 mg takeone tablet as needed for weight gain of 5 lbs within 3 days.   Labs: 06/05/2020 Creatinine 1.93, BUN 28, Potassium 3.7, Sodium 139, GFR 29-34 05/01/2020 Creatinine0.81, BUN14, Potassium3.3, Sodium143, GFR>60 04/19/2020 Creatinine0.81, BUN11, Potassium3.2, RSWNIO270, GFR>60  01/31/2020 Creatinine0.88, BUN13, Potassium3.4, Sodium141, GFR>60 A complete set of results can be found in Results Review.  Recommendations:No changes.    Follow-up plan: ICM clinic phone appointment on9/27/2021. 91 day device clinic remote transmission9/07/2020.   EP/Cardiology next office visit: Recall for 09/01/2021 withDr. Lovena Le        Copy of ICM check sent to Dr. Lovena Le.  3 Month Trend    8 Day Data Trend          [name], RN 09/19/2020 9:16 AM

## 2020-09-22 ENCOUNTER — Other Ambulatory Visit: Payer: Self-pay

## 2020-09-22 ENCOUNTER — Other Ambulatory Visit: Payer: Self-pay | Admitting: Nurse Practitioner

## 2020-09-22 ENCOUNTER — Ambulatory Visit
Admission: RE | Admit: 2020-09-22 | Discharge: 2020-09-22 | Disposition: A | Discharge status: Home / Self Care | Payer: Medicare Other | Source: Ambulatory Visit | Attending: Nurse Practitioner | Admitting: Nurse Practitioner

## 2020-09-22 DIAGNOSIS — R928 Other abnormal and inconclusive findings on diagnostic imaging of breast: Secondary | ICD-10-CM

## 2020-09-22 DIAGNOSIS — R921 Mammographic calcification found on diagnostic imaging of breast: Secondary | ICD-10-CM | POA: Diagnosis not present

## 2020-09-25 DIAGNOSIS — D688 Other specified coagulation defects: Secondary | ICD-10-CM | POA: Diagnosis not present

## 2020-09-25 DIAGNOSIS — D649 Anemia, unspecified: Secondary | ICD-10-CM | POA: Diagnosis not present

## 2020-09-28 ENCOUNTER — Encounter: Payer: Self-pay | Admitting: Nurse Practitioner

## 2020-09-29 ENCOUNTER — Other Ambulatory Visit (HOSPITAL_COMMUNITY): Payer: Self-pay | Admitting: Internal Medicine

## 2020-09-29 ENCOUNTER — Other Ambulatory Visit: Payer: Self-pay | Admitting: Nurse Practitioner

## 2020-10-02 ENCOUNTER — Ambulatory Visit: Payer: Self-pay | Admitting: Pharmacist

## 2020-10-02 ENCOUNTER — Telehealth: Payer: Self-pay

## 2020-10-02 DIAGNOSIS — E1169 Type 2 diabetes mellitus with other specified complication: Secondary | ICD-10-CM

## 2020-10-02 DIAGNOSIS — I10 Essential (primary) hypertension: Secondary | ICD-10-CM

## 2020-10-02 DIAGNOSIS — D688 Other specified coagulation defects: Secondary | ICD-10-CM | POA: Diagnosis not present

## 2020-10-02 NOTE — Telephone Encounter (Signed)
Pt consent to MYCHART visit 

## 2020-10-02 NOTE — Patient Instructions (Addendum)
Visit Information  Goals Addressed            This Visit's Progress   . Pharmacy Care Plan       CARE PLAN ENTRY  Current Barriers:  . Chronic Disease Management support, education, and care coordination needs related to Hypertension, Hyperlipidemia, Diabetes, and Heart Failure   Hypertension . Pharmacist Clinical Goal(s): o Over the next 90 days, patient will work with PharmD and providers to maintain BP goal <130/80 . Current regimen:  o Carvedilol 25mg  twice daily with a meal o Spironolactone 25mg  twice daily o Hydralazine 25mg  three times daily . Interventions: o Recommended increase in physical activity  o Recommended heart healthy diet o Assisted with coordination of delivery of new, decreased dose of hydralazine . Patient self care activities - Over the next 4 weeks, patient will: o Check BP daily, document, and provide at future appointments o Ensure daily salt intake < 2300 mg/day o Exercise 15 minutes daily 5 times per week  Hyperlipidemia . Pharmacist Clinical Goal(s): o Over the next 90 days, patient will work with PharmD and providers to maintain LDL goal < 100 . Current regimen:  o Atorvastatin 10mg  daily . Interventions: o Recommend increase in physical activity . Patient self care activities - Over the next 4 weeks, patient will: o Exercise 15 minutes daily 5 times per week o Limit fried foods and red meats  Diabetes . Pharmacist Clinical Goal(s): o Over the next 90 days, patient will work with PharmD and providers to maintain A1c goal <7% . Current regimen:  o Metformin 500mg  with morning and evening meal o Novolog Flexpen up to 17 units three times daily as directed per sliding scale o Tresiba 14 units daily . Interventions: o Continue to push for [place] delivery of Dexcom o Counseled on s/sx of hyper and hypoglycemia . Patient self care activities - Over the next 4 weeks, patient will: o Check blood sugar 3-4 times daily, document, and provide at  future appointments o Contact provider with any episodes of hypoglycemia o Exercise 15 minutes daily 5 times per week  Heart Failure . Pharmacist Clinical Goal(s) o Over the next 62, patient will work with PharmD and providers to prevent weight gain and fluid accumulation . Current regimen:  o Carvedilol 25mg  twice daily with a meal o Spironolactone 25mg  twice daily o Hydralazine 75mg  three times daily o Torsemide 20mg  on Monday, Wednesday, and Friday o Entresto 97-103mg  twice daily o Metolazone 2.5mg  as needed for weight gain of 5lbs within 3 days as directed . Interventions o Recommended to assess fluid status daily . Patient self care activities - Over the next 90 days, patient will: o Weigh daily, record, and provide at future appointments  Medication management . Pharmacist Clinical Goal(s): o Over the next 90 days, patient will work with PharmD and providers to maintain optimal medication adherence . Current pharmacy: [facility] . Interventions o Reviewed medications for upcoming synchronization and delivery . Patient self care activities - Over the next 4 weeks, patient will: o Focus on medication adherence by adherence packaging and medication synchronization o Take medications as prescribed o Report any questions or concerns to PharmD and/or provider(s)  Please see past updates related to this goal by clicking on the "Past Updates" button in the selected goal         The patient verbalized understanding of instructions provided today and agreed to receive a mailed copy of patient instruction and/or educational materials.   Beverly Milch, PharmD  Clinical Pharmacist [place] [phone]   Managing Your Hypertension Hypertension is commonly called high blood pressure. This is when the force of your blood pressing against the walls of your arteries is too strong. Arteries are blood vessels that carry blood from your heart throughout  your body. Hypertension forces the heart to work harder to pump blood, and may cause the arteries to become narrow or stiff. Having untreated or uncontrolled hypertension can cause heart attack, stroke, kidney disease, and other problems. What are blood pressure readings? A blood pressure reading consists of a higher number over a lower number. Ideally, your blood pressure should be below 120/80. The first ("top") number is called the systolic pressure. It is a measure of the pressure in your arteries as your heart beats. The second ("bottom") number is called the diastolic pressure. It is a measure of the pressure in your arteries as the heart relaxes. What does my blood pressure reading mean? Blood pressure is classified into four stages. Based on your blood pressure reading, your health care provider may use the following stages to determine what type of treatment you need, if any. Systolic pressure and diastolic pressure are measured in a unit called mm Hg. Normal  Systolic pressure: below 332.  Diastolic pressure: below 80. Elevated  Systolic pressure: 951-884.  Diastolic pressure: below 80. Hypertension stage 1  Systolic pressure: 166-063.  Diastolic pressure: 01-60. Hypertension stage 2  Systolic pressure: 109 or above.  Diastolic pressure: 90 or above. What health risks are associated with hypertension? Managing your hypertension is an important responsibility. Uncontrolled hypertension can lead to:  A heart attack.  A stroke.  A weakened blood vessel (aneurysm).  Heart failure.  Kidney damage.  Eye damage.  Metabolic syndrome.  Memory and concentration problems. What changes can I make to manage my hypertension? Hypertension can be managed by making lifestyle changes and possibly by taking medicines. Your health care provider will help you make a plan to bring your blood pressure within a normal range. Eating and drinking   Eat a diet that is high in fiber and  potassium, and low in salt (sodium), added sugar, and fat. An example eating plan is called the DASH (Dietary Approaches to Stop Hypertension) diet. To eat this way: ? Eat plenty of fresh fruits and vegetables. Try to fill half of your plate at each meal with fruits and vegetables. ? Eat whole grains, such as whole wheat pasta, brown rice, or whole grain bread. Fill about one quarter of your plate with whole grains. ? Eat low-fat diary products. ? Avoid fatty cuts of meat, processed or cured meats, and poultry with skin. Fill about one quarter of your plate with lean proteins such as fish, chicken without skin, beans, eggs, and tofu. ? Avoid premade and processed foods. These tend to be higher in sodium, added sugar, and fat.  Reduce your daily sodium intake. Most people with hypertension should eat less than 1,500 mg of sodium a day.  Limit alcohol intake to no more than 1 drink a day for nonpregnant women and 2 drinks a day for men. One drink equals 12 oz of beer, 5 oz of wine, or 1 oz of hard liquor. Lifestyle  Work with your health care provider to maintain a healthy body weight, or to lose weight. Ask what an ideal weight is for you.  Get at least 30 minutes of exercise that causes your heart to beat faster (aerobic exercise) most days of the  week. Activities may include walking, swimming, or biking.  Include exercise to strengthen your muscles (resistance exercise), such as weight lifting, as part of your weekly exercise routine. Try to do these types of exercises for 30 minutes at least 3 days a week.  Do not use any products that contain nicotine or tobacco, such as cigarettes and e-cigarettes. If you need help quitting, ask your health care provider.  Control any long-term (chronic) conditions you have, such as high cholesterol or diabetes. Monitoring  Monitor your blood pressure at home as told by your health care provider. Your personal target blood pressure may vary depending on  your medical conditions, your age, and other factors.  Have your blood pressure checked regularly, as often as told by your health care provider. Working with your health care provider  Review all the medicines you take with your health care provider because there may be side effects or interactions.  Talk with your health care provider about your diet, exercise habits, and other lifestyle factors that may be contributing to hypertension.  Visit your health care provider regularly. Your health care provider can help you create and adjust your plan for managing hypertension. Will I need medicine to control my blood pressure? Your health care provider may prescribe medicine if lifestyle changes are not enough to get your blood pressure under control, and if:  Your systolic blood pressure is 130 or higher.  Your diastolic blood pressure is 80 or higher. Take medicines only as told by your health care provider. Follow the directions carefully. Blood pressure medicines must be taken as prescribed. The medicine does not work as well when you skip doses. Skipping doses also puts you at risk for problems. Contact a health care provider if:  You think you are having a reaction to medicines you have taken.  You have repeated (recurrent) headaches.  You feel dizzy.  You have swelling in your ankles.  You have trouble with your vision. Get help right away if:  You develop a severe headache or confusion.  You have unusual weakness or numbness, or you feel faint.  You have severe pain in your chest or abdomen.  You vomit repeatedly.  You have trouble breathing. Summary  Hypertension is when the force of blood pumping through your arteries is too strong. If this condition is not controlled, it may put you at risk for serious complications.  Your personal target blood pressure may vary depending on your medical conditions, your age, and other factors. For most people, a normal blood  pressure is less than 120/80.  Hypertension is managed by lifestyle changes, medicines, or both. Lifestyle changes include weight loss, eating a healthy, low-sodium diet, exercising more, and limiting alcohol. This information is not intended to replace advice given to you by your health care provider. Make sure you discuss any questions you have with your health care provider. Document Revised: 04/02/2019 Document Reviewed: 11/06/2016 Elsevier Patient Education  [place].

## 2020-10-02 NOTE — Chronic Care Management (AMB) (Signed)
Chronic Care Management   Follow Up Note   10/02/2020 Name: Christine Mcdowell MRN: [id number] DOB: 09/24/1971  Referred by: [name], MD Reason for referral : Chronic Care Management (PharmD follow up)   Christine Mcdowell is a 49 y.o. year old female who is a primary care patient of [name], MD. The CCM team was consulted for assistance with chronic disease management and care coordination needs.    Review of patient status, including review of consultants reports, relevant laboratory and other test results, and collaboration with appropriate care team members and the patient's provider was performed as part of comprehensive patient evaluation and provision of chronic care management services.    SDOH (Social Determinants of Health) assessments performed: No See Care Plan activities for detailed interventions related to [facility])     Outpatient Encounter Medications as of 10/02/2020  Medication Sig  . albuterol (ACCUNEB) 1.25 MG/3ML nebulizer solution Take 3 mLs (1.25 mg total) by nebulization every 6 (six) hours as needed for wheezing.  Marland Kitchen aspirin 81 MG chewable tablet Chew 162 mg by mouth daily. Taking 2 tablets  . atorvastatin (LIPITOR) 10 MG tablet Take 1 tablet (10 mg total) by mouth daily.  . Azelastine HCl 137 MCG/SPRAY SOLN Place 2 sprays into the nose daily.  . Bacillus Coagulans-Inulin (PROBIOTIC) 1-250 BILLION-MG CAPS Take 1 capsule by mouth daily.  . blood glucose meter kit and supplies KIT Dispense based on patient and insurance preference. Use to check blood sugars daily E11.9  . carvedilol (COREG) 25 MG tablet TAKE 1 TABLET(25 MG) BY MOUTH TWICE DAILY WITH A MEAL  . Continuous Blood Gluc Receiver (DEXCOM G6 RECEIVER) DEVI 1 Device by Does not apply route 3 (three) times daily before meals.  . Continuous Blood Gluc Sensor (DEXCOM G6 SENSOR) MISC Inject 1 each into the skin 3 (three) times daily.  . Continuous Blood Gluc Transmit (DEXCOM G6 TRANSMITTER) MISC 1  Device by Does not apply route 3 (three) times daily before meals.  . diclofenac Sodium (VOLTAREN) 1 % GEL APPLY SMALL AMOUNT TO INVOLVED JOINTS UP TO TWICE DAILY  . ENTRESTO 97-103 MG TAKE ONE TABLET BY MOUTH EVERY MORNING and TAKE ONE TABLET BY MOUTH EVERY EVENING  . folic acid (FOLVITE) 1 MG tablet TAKE 1 TABLET BY MOUTH EVERY MORNING  . hydrALAZINE (APRESOLINE) 25 MG tablet Take 3 tablets (75 mg total) by mouth 3 (three) times daily.  . hydroxychloroquine (PLAQUENIL) 200 MG tablet Take 1 tablet (200 mg total) by mouth 2 (two) times daily.  . insulin lispro (HUMALOG) 100 UNIT/ML injection Inject insulin according to sliding scale 3 times a day before meals.  Marland Kitchen ipratropium (ATROVENT) 0.03 % nasal spray USE 2 SPRAYS IN EACH NOSTRIL THREE TIMES DAILY AS NEEDED FOR RHINITIS  . Magnesium 400 MG TABS Take 1 tablet by mouth daily.   . meloxicam (MOBIC) 15 MG tablet Take 15 mg by mouth daily.   . metFORMIN (GLUCOPHAGE) 500 MG tablet TAKE ONE TABLET BY MOUTH EVERY MORNING and TAKE ONE TABLET BY MOUTH EVERY EVENING  . methotrexate (RHEUMATREX) 2.5 MG tablet Take 20 mg by mouth every Friday.   . metolazone (ZAROXOLYN) 2.5 MG tablet TAKE 1 TABLET BY MOUTH AS NEEDED FOR WEIGHT GAIN OF 5 POUNDS WITHIN 3 DAYS AS DIRECTED  . montelukast (SINGULAIR) 10 MG tablet TAKE ONE TABLET BY MOUTH EVERY MORNING  . Multiple Vitamin (MULTIVITAMIN WITH MINERALS) TABS Take 1 tablet by mouth every morning.   . ondansetron (ZOFRAN-ODT) 4 MG disintegrating tablet Take  1 tablet by mouth as needed for nausea.  . potassium chloride SA (KLOR-CON) 20 MEQ tablet TAKE TWO TABLETS BY MOUTH EVERY MORNING  . predniSONE (DELTASONE) 10 MG tablet Take 2 tabs daily x 5 days, then 1.5 tabs daily x 5 days, then 1 tab daily x 5 days, then 0.5 tabs daily x 5 days  . PROAIR HFA 108 (90 Base) MCG/ACT inhaler INHALE 2 PUFFS BY MOUTH EVERY DAY AS NEEDED FOR SHORTNESS OF BREATH  . spironolactone (ALDACTONE) 25 MG tablet TAKE ONE TABLET BY MOUTH EVERY  MORNING and TAKE ONE TABLET BY MOUTH EVERY EVENING  . Tocilizumab (ACTEMRA) 200 MG/10ML SOLN Inject into the vein every 30 (thirty) days.  Marland Kitchen torsemide (DEMADEX) 20 MG tablet Take 1 tablet Monday, Wednesday , and Friday  . traMADol (ULTRAM) 50 MG tablet Take 1 tablet (50 mg total) by mouth every 6 (six) hours as needed.  . traZODone (DESYREL) 50 MG tablet Take 50 mg by mouth at bedtime.  Tyler Aas FLEXTOUCH 100 UNIT/ML FlexTouch Pen Inject 0.14 mLs (14 Units total) into the skin daily.  . Vitamin D, Ergocalciferol, (DRISDOL) 1.25 MG (50000 UNIT) CAPS capsule TAKE 1 CAPSULE BY MOUTH 2 TIMES EVERY WEEK   No facility-administered encounter medications on file as of 10/02/2020.      Goals Addressed            This Visit's Progress   . Pharmacy Care Plan       CARE PLAN ENTRY  Current Barriers:  . Chronic Disease Management support, education, and care coordination needs related to Hypertension, Hyperlipidemia, Diabetes, and Heart Failure   Hypertension . Pharmacist Clinical Goal(s): o Over the next 90 days, patient will work with PharmD and providers to maintain BP goal <130/80 . Current regimen:  o Carvedilol 33m twice daily with a meal o Spironolactone 246mtwice daily o Hydralazine 2514mhree times daily . Interventions: o Recommended increase in physical activity  o Recommended heart healthy diet o Assisted with coordination of delivery of new, decreased dose of hydralazine . Patient self care activities - Over the next 4 weeks, patient will: o Check BP daily, document, and provide at future appointments o Ensure daily salt intake < 2300 mg/day o Exercise 15 minutes daily 5 times per week  Hyperlipidemia . Pharmacist Clinical Goal(s): o Over the next 90 days, patient will work with PharmD and providers to maintain LDL goal < 100 . Current regimen:  o Atorvastatin 46m4mily . Interventions: o Recommend increase in physical activity . Patient self care activities - Over  the next 4 weeks, patient will: o Exercise 15 minutes daily 5 times per week o Limit fried foods and red meats  Diabetes . Pharmacist Clinical Goal(s): o Over the next 90 days, patient will work with PharmD and providers to maintain A1c goal <7% . Current regimen:  o Metformin 500mg16mh morning and evening meal o Novolog Flexpen up to 17 units three times daily as directed per sliding scale o Tresiba 14 units daily . Interventions: o Continue to push for [facility] delivery of Dexcom o Counseled on s/sx of hyper and hypoglycemia . Patient self care activities - Over the next 4 weeks, patient will: o Check blood sugar 3-4 times daily, document, and provide at future appointments o Contact provider with any episodes of hypoglycemia o Exercise 15 minutes daily 5 times per week  Heart Failure . Pharmacist Clinical Goal(s) o Over the next 90, p60ient will work with PharmD and providers to  prevent weight gain and fluid accumulation . Current regimen:  o Carvedilol 55m twice daily with a meal o Spironolactone 229mtwice daily o Hydralazine 7519mhree times daily o Torsemide 85m33m Monday, Wednesday, and Friday o Entresto 97-103mg42mce daily o Metolazone 2.5mg a38meeded for weight gain of 5lbs within 3 days as directed . Interventions o Recommended to assess fluid status daily . Patient self care activities - Over the next 90 days, patient will: o Weigh daily, record, and provide at future appointments  Medication management . Pharmacist Clinical Goal(s): o Over the next 90 days, patient will work with PharmD and providers to maintain optimal medication adherence . Current pharmacy: [facility] . Interventions o Reviewed medications for upcoming synchronization and delivery . Patient self care activities - Over the next 4 weeks, patient will: o Focus on medication adherence by adherence packaging and medication synchronization o Take medications as prescribed o Report any  questions or concerns to PharmD and/or provider(s)  Please see past updates related to this goal by clicking on the "Past Updates" button in the selected goal         Diabetes   A1c goal <7%  Recent Relevant Labs: Lab Results  Component Value Date/Time   HGBA1C 6.9 (H) 08/30/2020 05:29 PM   HGBA1C 6.3 (H) 12/02/2019 06:08 PM   GFR 86.64 02/28/2015 10:05 AM   GFR 91.33 02/03/2015 10:33 AM   MICROALBUR 10 08/24/2019 06:06 PM    Last diabetic Eye exam: No results found for: HMDIABEYEEXA  Last diabetic Foot exam: No results found for: HMDIABFOOTEX   Checking BG: Never  Readings unknown she has been without Dexcome since early September  Patient has failed these meds in past: none noted Patient is currently controlled on the following medications: . Humalog 100u/ml sliding scale . Tresiba 100u/ml 14 units daily . Metformin 500mg t70m daily  She is not currently checking her blood sugars due to being out of the Dexcom sensors.  She denies symptoms of hyper and hypoglycemia.  CCM team to follow up on status of Dexcom.  No change to diet/exercise, see previous.  Plan  Continue current medications, CCM team to follow up on Dexcom.  No med changes as we do not have any blood sugar logs to go by.   Hypertension   BP goal is:  <130/80  Office blood pressures are  BP Readings from Last 3 Encounters:  08/31/20 118/68  08/30/20 136/76  08/30/20 132/76   Patient checks BP at home daily Patient home BP readings are ranging: 156/89, 159/74  Patient has failed these meds in the past: none noted Patient is currently uncontrolled on the following medications:  . Carvedilol 25mg tw93mdaily . Hydralazine 25mg tid54mpironolactone 25mg bid 83m reports lots of stress lately due to mammogram and anxiety over results.  BP in September were normal in office.  She is compliant with medications.  Denies swelling, headache, or dizziness.  She is having the biopsy done tomorrow,  attributes her increased BP to that.  Discussed need to contact providers with consistent BP > 130/90.  Plan  Continue current medications, f/u as previous    Also reviewed medications in depth with patient for upcoming delivery and synchronization from [facility].  Caiden Arteaga Beverly Milchlinical Pharmacist [place]-[phone]

## 2020-10-03 ENCOUNTER — Other Ambulatory Visit (HOSPITAL_COMMUNITY): Payer: Self-pay | Admitting: *Deleted

## 2020-10-03 ENCOUNTER — Telehealth: Payer: Self-pay | Admitting: Pharmacist

## 2020-10-03 ENCOUNTER — Ambulatory Visit
Admission: RE | Admit: 2020-10-03 | Discharge: 2020-10-03 | Disposition: A | Discharge status: Home / Self Care | Payer: Medicare Other | Source: Ambulatory Visit | Attending: Nurse Practitioner | Admitting: Nurse Practitioner

## 2020-10-03 ENCOUNTER — Other Ambulatory Visit: Payer: Self-pay

## 2020-10-03 DIAGNOSIS — N62 Hypertrophy of breast: Secondary | ICD-10-CM | POA: Diagnosis not present

## 2020-10-03 DIAGNOSIS — R921 Mammographic calcification found on diagnostic imaging of breast: Secondary | ICD-10-CM

## 2020-10-03 MED ORDER — ONDANSETRON 4 MG PO TBDP: 4.0000 mg | ORAL_TABLET | ORAL | 1 refills | Status: DC | PRN

## 2020-10-03 NOTE — Chronic Care Management (AMB) (Signed)
Chronic Care Management Pharmacy Assistant   Name: Christine Mcdowell  MRN: [id number] DOB: 1971-01-23  Reason for Encounter: Medication Review    PCP : [name], MD  Allergies:   Allergies  Allergen Reactions  . Hydromorphone Nausea And Vomiting    Other reaction(s): GI Upset (intolerance), Hypertension (intolerance) Raises blood pressure  Other reaction(s): GI Upset (intolerance), Hypertension (intolerance) Raises blood pressure to stroke level  . Iodinated Diagnostic Agents Other (See Comments)    Shuts down kidneys Shuts kidney function down  . Other Other (See Comments) and Anaphylaxis    Spicy foods and seasonings Skin Prep "makes my skin peel off" Paper tape causes skin burns  . Erythromycin Nausea And Vomiting  . Latex Hives  . Tape Other (See Comments)    "Skin burns"  . Mircette [Desogestrel-Ethinyl Estradiol] Nausea And Vomiting and Rash    Medications: Outpatient Encounter Medications as of 10/03/2020  Medication Sig  . albuterol (ACCUNEB) 1.25 MG/3ML nebulizer solution Take 3 mLs (1.25 mg total) by nebulization every 6 (six) hours as needed for wheezing.  Marland Kitchen aspirin 81 MG chewable tablet Chew 162 mg by mouth daily. Taking 2 tablets  . atorvastatin (LIPITOR) 10 MG tablet Take 1 tablet (10 mg total) by mouth daily.  . Azelastine HCl 137 MCG/SPRAY SOLN Place 2 sprays into the nose daily.  . Bacillus Coagulans-Inulin (PROBIOTIC) 1-250 BILLION-MG CAPS Take 1 capsule by mouth daily.  . blood glucose meter kit and supplies KIT Dispense based on patient and insurance preference. Use to check blood sugars daily E11.9  . carvedilol (COREG) 25 MG tablet TAKE 1 TABLET(25 MG) BY MOUTH TWICE DAILY WITH A MEAL  . Continuous Blood Gluc Receiver (DEXCOM G6 RECEIVER) DEVI 1 Device by Does not apply route 3 (three) times daily before meals.  . Continuous Blood Gluc Sensor (DEXCOM G6 SENSOR) MISC Inject 1 each into the skin 3 (three) times daily.  . Continuous Blood  Gluc Transmit (DEXCOM G6 TRANSMITTER) MISC 1 Device by Does not apply route 3 (three) times daily before meals.  . diclofenac Sodium (VOLTAREN) 1 % GEL APPLY SMALL AMOUNT TO INVOLVED JOINTS UP TO TWICE DAILY  . ENTRESTO 97-103 MG TAKE ONE TABLET BY MOUTH EVERY MORNING and TAKE ONE TABLET BY MOUTH EVERY EVENING  . folic acid (FOLVITE) 1 MG tablet TAKE 1 TABLET BY MOUTH EVERY MORNING  . hydrALAZINE (APRESOLINE) 25 MG tablet Take 3 tablets (75 mg total) by mouth 3 (three) times daily.  . hydroxychloroquine (PLAQUENIL) 200 MG tablet Take 1 tablet (200 mg total) by mouth 2 (two) times daily.  . insulin lispro (HUMALOG) 100 UNIT/ML injection Inject insulin according to sliding scale 3 times a day before meals.  Marland Kitchen ipratropium (ATROVENT) 0.03 % nasal spray USE 2 SPRAYS IN EACH NOSTRIL THREE TIMES DAILY AS NEEDED FOR RHINITIS  . Magnesium 400 MG TABS Take 1 tablet by mouth daily.   . meloxicam (MOBIC) 15 MG tablet Take 15 mg by mouth daily.   . metFORMIN (GLUCOPHAGE) 500 MG tablet TAKE ONE TABLET BY MOUTH EVERY MORNING and TAKE ONE TABLET BY MOUTH EVERY EVENING  . methotrexate (RHEUMATREX) 2.5 MG tablet Take 20 mg by mouth every Friday.   . metolazone (ZAROXOLYN) 2.5 MG tablet TAKE 1 TABLET BY MOUTH AS NEEDED FOR WEIGHT GAIN OF 5 POUNDS WITHIN 3 DAYS AS DIRECTED  . montelukast (SINGULAIR) 10 MG tablet TAKE ONE TABLET BY MOUTH EVERY MORNING  . Multiple Vitamin (MULTIVITAMIN WITH MINERALS) TABS Take 1 tablet by  mouth every morning.   . ondansetron (ZOFRAN-ODT) 4 MG disintegrating tablet Take 1 tablet (4 mg total) by mouth as needed for nausea.  . potassium chloride SA (KLOR-CON) 20 MEQ tablet TAKE TWO TABLETS BY MOUTH EVERY MORNING  . predniSONE (DELTASONE) 10 MG tablet Take 2 tabs daily x 5 days, then 1.5 tabs daily x 5 days, then 1 tab daily x 5 days, then 0.5 tabs daily x 5 days  . PROAIR HFA 108 (90 Base) MCG/ACT inhaler INHALE 2 PUFFS BY MOUTH EVERY DAY AS NEEDED FOR SHORTNESS OF BREATH  .  spironolactone (ALDACTONE) 25 MG tablet TAKE ONE TABLET BY MOUTH EVERY MORNING and TAKE ONE TABLET BY MOUTH EVERY EVENING  . Tocilizumab (ACTEMRA) 200 MG/10ML SOLN Inject into the vein every 30 (thirty) days.  Marland Kitchen torsemide (DEMADEX) 20 MG tablet Take 1 tablet Monday, Wednesday , and Friday  . traMADol (ULTRAM) 50 MG tablet Take 1 tablet (50 mg total) by mouth every 6 (six) hours as needed.  . traZODone (DESYREL) 50 MG tablet Take 50 mg by mouth at bedtime.  Tyler Aas FLEXTOUCH 100 UNIT/ML FlexTouch Pen Inject 0.14 mLs (14 Units total) into the skin daily.  . Vitamin D, Ergocalciferol, (DRISDOL) 1.25 MG (50000 UNIT) CAPS capsule TAKE 1 CAPSULE BY MOUTH 2 TIMES EVERY WEEK   No facility-administered encounter medications on file as of 10/03/2020.    Current Diagnosis: Patient Active Problem List   Diagnosis Date Noted  . 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 History of COVID-19 04/12/2020  . COVID-19 virus infection 03/07/2020  . Lower abdominal pain 09/07/2019  . Chronic systolic (congestive) heart failure ([place]) 01/28/2018  . Cough 01/19/2018  . Pulmonary infiltrates on CXR 01/19/2018  . Migraines 10/08/2017  . Sleep apnea with use of continuous positive airway pressure (CPAP) 09/24/2017  . Congestive heart failure (CHF) ([place]) 10/09/2016  . Rheumatoid arthritis ([place]) 10/09/2016  . Asthma 10/09/2016  . Chronic pain 10/09/2016  . Heart failure ([place]) 10/09/2016  . Congestive heart failure ([place])   . Lupus (systemic lupus erythematosus) ([place])   . Diabetes mellitus with complication ([place])   . Anxiety state   . GERD (gastroesophageal reflux disease) 12/28/2015  . Essential hypertension 12/26/2015  . Type 2 diabetes mellitus ([place]) 12/26/2015  . Chronic systolic heart failure ([place]) 08/18/2014  . Cardiomyopathy, dilated ([place]) 01/27/2014  . Shortness of breath 01/26/2014  . SLE (systemic lupus erythematosus) ([place]) 01/26/2014   Patient had a follow up visit with  Leata Mouse, Pharm D 10/02/2020, medications reviewed for delivery. Patient complained to pharmacist that she is out of all of her medications. Prepared refill dispensing form for [facility] delivery on 10/03/2020.  10/03/2020- Called patient to verify which medications she was out of and inquired on when she ran out. Reviewed all medications, patient explained that she was out of medications due to receiving a short supply of medications and awaiting the remainder of medications to fulfil supply days. [facility] notified, preparing medications for delivery.   10/03/2020- [facility] notified pharmacist that patient is out of refills of Trazodone prescribed by Dr. Rogers Blocker. Also out of refills of  Meloxicam 15 mg and Ondansetron 4 mg ODT prescribed by Dr Baird Cancer and Hydroxychloroquine 200 mg refills needed that are prescribed by Dr. Haroldine Laws.   Follow-Up:  Care Coordination with Outside Provider, Coordination of Enhanced Pharmacy Services, Patient Assistance Coordination and Pharmacist Review   Called Dr Haroldine Laws office, spoke with Caryl Pina and requested refills of Hydroxychloroquine 200 mg to [facility].  Requested refills of Meloxicam 15 mg and Ondansetron 4 mg from Dr Baird Cancer office.  Called Dr Shelby Mattocks office, requested refill of Trazodone to be sent to [facility].  10/04/2020- Mrs Judithann Sheen, CPA followed up with [facility] regarding patients Dexcom diabetes supplies. They are still waiting on forms to be received with a diagnosis code on them. Informed that we have already sent that twice, waiting on follow up with ETA of delivery on her supplies.  Pattricia Boss, [place] Pharmacist Assistant [phone]

## 2020-10-04 ENCOUNTER — Ambulatory Visit: Payer: Medicare Other

## 2020-10-04 ENCOUNTER — Encounter: Payer: Self-pay | Admitting: Nurse Practitioner

## 2020-10-04 DIAGNOSIS — I10 Essential (primary) hypertension: Secondary | ICD-10-CM

## 2020-10-04 DIAGNOSIS — E1169 Type 2 diabetes mellitus with other specified complication: Secondary | ICD-10-CM

## 2020-10-05 ENCOUNTER — Other Ambulatory Visit: Payer: Self-pay | Admitting: Nurse Practitioner

## 2020-10-05 DIAGNOSIS — Z862 Personal history of diseases of the blood and blood-forming organs and certain disorders involving the immune mechanism: Secondary | ICD-10-CM

## 2020-10-05 NOTE — Chronic Care Management (AMB) (Signed)
Chronic Care Management    Social Work Follow Up Note  10/04/2020 Name: Christine Mcdowell MRN: [id number] DOB: 06-Oct-1971  Christine Mcdowell is a 49 y.o. year old female who is a primary care patient of [name], MD. The CCM team was consulted for assistance with care coordination.   Review of patient status, including review of consultants reports, other relevant assessments, and collaboration with appropriate care team members and the patient's provider was performed as part of comprehensive patient evaluation and provision of chronic care management services.    SDOH (Social Determinants of Health) assessments performed: Yes SDOH Interventions     Most Recent Value  SDOH Interventions  Housing Interventions Other (Comment)  [Provided website to apply for financial assistance from [employer] and [employer]]       Outpatient Encounter Medications as of 10/04/2020  Medication Sig  . albuterol (ACCUNEB) 1.25 MG/3ML nebulizer solution Take 3 mLs (1.25 mg total) by nebulization every 6 (six) hours as needed for wheezing.  Marland Kitchen aspirin 81 MG chewable tablet Chew 162 mg by mouth daily. Taking 2 tablets  . atorvastatin (LIPITOR) 10 MG tablet Take 1 tablet (10 mg total) by mouth daily.  . Azelastine HCl 137 MCG/SPRAY SOLN Place 2 sprays into the nose daily.  . Bacillus Coagulans-Inulin (PROBIOTIC) 1-250 BILLION-MG CAPS Take 1 capsule by mouth daily.  . blood glucose meter kit and supplies KIT Dispense based on patient and insurance preference. Use to check blood sugars daily E11.9  . carvedilol (COREG) 25 MG tablet TAKE 1 TABLET(25 MG) BY MOUTH TWICE DAILY WITH A MEAL  . Continuous Blood Gluc Receiver (DEXCOM G6 RECEIVER) DEVI 1 Device by Does not apply route 3 (three) times daily before meals.  . Continuous Blood Gluc Sensor (DEXCOM G6 SENSOR) MISC Inject 1 each into the skin 3 (three) times daily.  . Continuous Blood Gluc Transmit (DEXCOM G6 TRANSMITTER) MISC 1 Device by Does not apply  route 3 (three) times daily before meals.  . diclofenac Sodium (VOLTAREN) 1 % GEL APPLY SMALL AMOUNT TO INVOLVED JOINTS UP TO TWICE DAILY  . ENTRESTO 97-103 MG TAKE ONE TABLET BY MOUTH EVERY MORNING and TAKE ONE TABLET BY MOUTH EVERY EVENING  . folic acid (FOLVITE) 1 MG tablet TAKE 1 TABLET BY MOUTH EVERY MORNING  . hydrALAZINE (APRESOLINE) 25 MG tablet Take 3 tablets (75 mg total) by mouth 3 (three) times daily.  . hydroxychloroquine (PLAQUENIL) 200 MG tablet Take 1 tablet (200 mg total) by mouth 2 (two) times daily.  . insulin lispro (HUMALOG) 100 UNIT/ML injection Inject insulin according to sliding scale 3 times a day before meals.  Marland Kitchen ipratropium (ATROVENT) 0.03 % nasal spray USE 2 SPRAYS IN EACH NOSTRIL THREE TIMES DAILY AS NEEDED FOR RHINITIS  . Magnesium 400 MG TABS Take 1 tablet by mouth daily.   . meloxicam (MOBIC) 15 MG tablet Take 15 mg by mouth daily.   . metFORMIN (GLUCOPHAGE) 500 MG tablet TAKE ONE TABLET BY MOUTH EVERY MORNING and TAKE ONE TABLET BY MOUTH EVERY EVENING  . methotrexate (RHEUMATREX) 2.5 MG tablet Take 20 mg by mouth every Friday.   . metolazone (ZAROXOLYN) 2.5 MG tablet TAKE 1 TABLET BY MOUTH AS NEEDED FOR WEIGHT GAIN OF 5 POUNDS WITHIN 3 DAYS AS DIRECTED  . montelukast (SINGULAIR) 10 MG tablet TAKE ONE TABLET BY MOUTH EVERY MORNING  . Multiple Vitamin (MULTIVITAMIN WITH MINERALS) TABS Take 1 tablet by mouth every morning.   . ondansetron (ZOFRAN-ODT) 4 MG disintegrating tablet Take 1 tablet (  4 mg total) by mouth as needed for nausea.  . potassium chloride SA (KLOR-CON) 20 MEQ tablet TAKE TWO TABLETS BY MOUTH EVERY MORNING  . predniSONE (DELTASONE) 10 MG tablet Take 2 tabs daily x 5 days, then 1.5 tabs daily x 5 days, then 1 tab daily x 5 days, then 0.5 tabs daily x 5 days  . PROAIR HFA 108 (90 Base) MCG/ACT inhaler INHALE 2 PUFFS BY MOUTH EVERY DAY AS NEEDED FOR SHORTNESS OF BREATH  . spironolactone (ALDACTONE) 25 MG tablet TAKE ONE TABLET BY MOUTH EVERY MORNING and  TAKE ONE TABLET BY MOUTH EVERY EVENING  . Tocilizumab (ACTEMRA) 200 MG/10ML SOLN Inject into the vein every 30 (thirty) days.  Marland Kitchen torsemide (DEMADEX) 20 MG tablet Take 1 tablet Monday, Wednesday , and Friday  . traMADol (ULTRAM) 50 MG tablet Take 1 tablet (50 mg total) by mouth every 6 (six) hours as needed.  . traZODone (DESYREL) 50 MG tablet Take 50 mg by mouth at bedtime.  Tyler Aas FLEXTOUCH 100 UNIT/ML FlexTouch Pen Inject 0.14 mLs (14 Units total) into the skin daily.  . Vitamin D, Ergocalciferol, (DRISDOL) 1.25 MG (50000 UNIT) CAPS capsule TAKE 1 CAPSULE BY MOUTH 2 TIMES EVERY WEEK   No facility-administered encounter medications on file as of 10/04/2020.     Goals Addressed            This Visit's Progress   . Collaborate with RN Care Manager to perform appropriate assessments to assist with care coordination needs       CARE PLAN ENTRY (see longitudinal plan of care for additional care plan information)  Current Barriers:  . Car broke down; patient having to use funds to pay for transportation to and from physician appointments . Patient reports having to use money set aside for mortgage to pay for car to be fixed . Limited social support . Limited knowledge of health plan transportation benefit . Chronic conditions including CHF, DM II, and HTN which put patient at increased risk of hospitalization  Social Work Clinical Goal(s):  Marland Kitchen Over the next 90 days the patient will work with SW to assist with ongoing care coordination needs  CCM SW Interventions: Completed 10/04/20 . Inter-disciplinary care team collaboration (see longitudinal plan of care) . Successful outbound call placed to the patient to conduct an SDoH screen . Determined the patient is facing financial struggles due to her car breaking down and needing to pay the mechanic o "I am going to have to use the money for my mortgage to pay to get my truck back. I have to get my truck back because I am spending all my  money on transportation to the doctor" . Discussed the patient has recently had a breast biopsy due to results from mammogram o The patient is worried she will have to go back and forth to [facility] several times pending results and may not be able to afford the transportation . Educated the patient on her [place] transportation benefit which will transport the patient up to 50 miles one way for physician appointments . Provided the patient with information on how to apply for financial assistance for her utilities and mortgage through [employer] as well as [employer]il [employer] . Scheduled follow up call to the patient over the next two weeks  Patient Self Care Activities:  . Patient verbalizes understanding of plan to apply for financial assistance via provided web links . Self administers medications as prescribed . Attends all scheduled provider appointments .  Performs ADL's independently . Performs IADL's independently . Calls provider office for new concerns or questions  Initial goal documentation         Follow Up Plan: SW will follow up with patient by phone over the next two weeks.   Daneen Schick, BSW, CDP Social Worker, Certified Dementia Practitioner [place] [facility] [place] [facility] [phone]  Total time spent performing care coordination and/or care management activities with the patient by phone or face to face = 22 minutes.

## 2020-10-05 NOTE — Patient Instructions (Signed)
Social Worker Visit Information  Goals we discussed today:  Goals Addressed            This Visit's Progress   . Collaborate with RN Care Manager to perform appropriate assessments to assist with care coordination needs       CARE PLAN ENTRY (see longitudinal plan of care for additional care plan information)  Current Barriers:  . Car broke down; patient having to use funds to pay for transportation to and from physician appointments . Patient reports having to use money set aside for mortgage to pay for car to be fixed . Limited social support . Limited knowledge of health plan transportation benefit . Chronic conditions including CHF, DM II, and HTN which put patient at increased risk of hospitalization  Social Work Clinical Goal(s):  Marland Kitchen Over the next 90 days the patient will work with SW to assist with ongoing care coordination needs  CCM SW Interventions: Completed 10/04/20 . Inter-disciplinary care team collaboration (see longitudinal plan of care) . Successful outbound call placed to the patient to conduct an SDoH screen . Determined the patient is facing financial struggles due to her car breaking down and needing to pay the mechanic o "I am going to have to use the money for my mortgage to pay to get my truck back. I have to get my truck back because I am spending all my money on transportation to the doctor" . Discussed the patient has recently had a breast biopsy due to results from mammogram o The patient is worried she will have to go back and forth to [facility] several times pending results and may not be able to afford the transportation . Educated the patient on her [place] transportation benefit which will transport the patient up to 50 miles one way for physician appointments . Provided the patient with information on how to apply for financial assistance for her utilities and mortgage through [employer] as well as Soil scientist . Scheduled follow up  call to the patient over the next two weeks  Patient Self Care Activities:  . Patient verbalizes understanding of plan to apply for financial assistance via provided web links . Self administers medications as prescribed . Attends all scheduled provider appointments . Performs ADL's independently . Performs IADL's independently . Calls provider office for new concerns or questions  Initial goal documentation         Follow Up Plan: SW will follow up with patient by phone over the next two weeks.   Daneen Schick, BSW, CDP Social Worker, Certified Dementia Practitioner [place] [facility] [place] [facility] [phone]

## 2020-10-09 ENCOUNTER — Encounter: Payer: Self-pay | Admitting: Nurse Practitioner

## 2020-10-09 ENCOUNTER — Other Ambulatory Visit: Payer: Self-pay

## 2020-10-09 ENCOUNTER — Telehealth (INDEPENDENT_AMBULATORY_CARE_PROVIDER_SITE_OTHER): Payer: Medicare Other | Admitting: Nurse Practitioner

## 2020-10-09 DIAGNOSIS — M0579 Rheumatoid arthritis with rheumatoid factor of multiple sites without organ or systems involvement: Secondary | ICD-10-CM | POA: Diagnosis not present

## 2020-10-09 DIAGNOSIS — G8929 Other chronic pain: Secondary | ICD-10-CM

## 2020-10-09 DIAGNOSIS — Z79899 Other long term (current) drug therapy: Secondary | ICD-10-CM | POA: Diagnosis not present

## 2020-10-09 DIAGNOSIS — M545 Low back pain, unspecified: Secondary | ICD-10-CM | POA: Diagnosis not present

## 2020-10-09 NOTE — Progress Notes (Signed)
Virtual Visit via MyChart   This visit type was conducted due to national recommendations for restrictions regarding the COVID-19 Pandemic (e.g. social distancing) in an effort to limit this patient's exposure and mitigate transmission in our community.  Due to her co-morbid illnesses, this patient is at least at moderate risk for complications without adequate follow up.  This format is felt to be most appropriate for this patient at this time.  All issues noted in this document were discussed and addressed.  A limited physical exam was performed with this format.    This visit type was conducted due to national recommendations for restrictions regarding the COVID-19 Pandemic (e.g. social distancing) in an effort to limit this patient's exposure and mitigate transmission in our community.  Patients identity confirmed using two different identifiers.  This format is felt to be most appropriate for this patient at this time.  All issues noted in this document were discussed and addressed.  No physical exam was performed (except for noted visual exam findings with Video Visits).    Date:  10/09/2020   ID:  Christine Mcdowell, DOB November 21, 1971, MRN [id number]  Patient Location:  Home - spoke with Christine Mcdowell  Provider location:   Office    Chief Complaint:  Low back pain wants referral   History of Present Illness:    Christine Mcdowell is a 49 y.o. female who presents via video conferencing for a telehealth visit today.    The patient does not have symptoms concerning for COVID-19 infection (fever, chills, cough, or new shortness of breath).   She is complaining of low back pain since having Covid since March. She is having extreme back pain. [facility] recommended physical therapy but would need a referral to aquatic therapy.  She would like the aquatic therapy.   She has never had physical therapy in person.      Past Medical History:  Diagnosis Date  . AICD (automatic  cardioverter/defibrillator) present 01/28/2018  . Anemia   . Anginal pain ([place])   . Asthma   . Cervical cancer ([place])    cervical 1996  . CHF (congestive heart failure) ([place])   . 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 Discoid lupus   . Fibromyalgia   . History of blood transfusion "several"   "related to anemia; had some w/hysterectomy also"  . Hx of cardiovascular stress test    ETT-Myoview (9/15):  No ischemia, EF 52%; NORMAL  . Hx of echocardiogram    Echo (9/15):  EF 50-55%, ant HK, Gr 1 DD, mild MR, mild LAE, no effusion  . Hypertension   . Iron deficiency anemia    h/o iron transfusions  . Lupus (systemic lupus erythematosus) ([place])   . Migraine    "a few/year" (07/03/2016)  . Pneumonia 12/2015  . RA (rheumatoid arthritis) ([place])    "all over" (07/03/2016)  . Sickle cell trait ([place])   . Stroke ([place]) 2014 X 1; 2015 X 2; 2016 X 1;    "right side of face more relaxed than the other; rare speech hesitation" (07/03/2016)  . Vaginal Pap smear, abnormal    ASCUS; HPV   Past Surgical History:  Procedure Laterality Date  . ABDOMINAL HYSTERECTOMY  2009  . ABDOMINAL WOUND DEHISCENCE  2009  . BUNIONECTOMY Left 06/15/2020  . CARDIAC CATHETERIZATION N/A 10/11/2016   Procedure: Right/Left Heart Cath and Coronary Angiography;  Surgeon: Jolaine Artist, MD;  Location: [facility];  Service:  Cardiovascular;  Laterality: N/A;  . DILATION AND CURETTAGE OF UTERUS  1991  . HEMATOMA EVACUATION  2009   abdomen  . ICD IMPLANT  01/28/2018  . ICD IMPLANT N/A 01/28/2018   Procedure: ICD IMPLANT;  Surgeon: Evans Lance, MD;  Location: [facility];  Service: Cardiovascular;  Laterality: N/A;  . INCISE AND DRAIN ABCESS  2009 X 2   "abdomen after hysterectomy"  . KNEE ARTHROSCOPY Right 1997  . KNEE SURGERY Right   . TUBAL LIGATION  1996     Current Meds  Medication Sig  . albuterol (ACCUNEB) 1.25 MG/3ML nebulizer solution Take 3 mLs (1.25 mg total)  by nebulization every 6 (six) hours as needed for wheezing.  Marland Kitchen aspirin 81 MG chewable tablet Chew 162 mg by mouth daily. Taking 2 tablets  . atorvastatin (LIPITOR) 10 MG tablet Take 1 tablet (10 mg total) by mouth daily.  . Azelastine HCl 137 MCG/SPRAY SOLN Place 2 sprays into the nose daily.  . Bacillus Coagulans-Inulin (PROBIOTIC) 1-250 BILLION-MG CAPS Take 1 capsule by mouth daily.  . carvedilol (COREG) 25 MG tablet TAKE 1 TABLET(25 MG) BY MOUTH TWICE DAILY WITH A MEAL  . diclofenac Sodium (VOLTAREN) 1 % GEL APPLY SMALL AMOUNT TO INVOLVED JOINTS UP TO TWICE DAILY  . ENTRESTO 97-103 MG TAKE ONE TABLET BY MOUTH EVERY MORNING and TAKE ONE TABLET BY MOUTH EVERY EVENING  . folic acid (FOLVITE) 1 MG tablet TAKE 1 TABLET BY MOUTH EVERY MORNING  . hydrALAZINE (APRESOLINE) 25 MG tablet Take 3 tablets (75 mg total) by mouth 3 (three) times daily.  . hydroxychloroquine (PLAQUENIL) 200 MG tablet Take 1 tablet (200 mg total) by mouth 2 (two) times daily.  . insulin lispro (HUMALOG) 100 UNIT/ML injection Inject insulin according to sliding scale 3 times a day before meals.  . Magnesium 400 MG TABS Take 1 tablet by mouth daily.   . meloxicam (MOBIC) 15 MG tablet Take 15 mg by mouth daily.   . metFORMIN (GLUCOPHAGE) 500 MG tablet TAKE ONE TABLET BY MOUTH EVERY MORNING and TAKE ONE TABLET BY MOUTH EVERY EVENING  . methotrexate (RHEUMATREX) 2.5 MG tablet Take 20 mg by mouth every Friday.   . metolazone (ZAROXOLYN) 2.5 MG tablet TAKE 1 TABLET BY MOUTH AS NEEDED FOR WEIGHT GAIN OF 5 POUNDS WITHIN 3 DAYS AS DIRECTED  . montelukast (SINGULAIR) 10 MG tablet TAKE ONE TABLET BY MOUTH EVERY MORNING  . Multiple Vitamin (MULTIVITAMIN WITH MINERALS) TABS Take 1 tablet by mouth every morning.   . ondansetron (ZOFRAN-ODT) 4 MG disintegrating tablet Take 1 tablet (4 mg total) by mouth as needed for nausea.  . potassium chloride SA (KLOR-CON) 20 MEQ tablet TAKE TWO TABLETS BY MOUTH EVERY MORNING  . PROAIR HFA 108 (90 Base)  MCG/ACT inhaler INHALE 2 PUFFS BY MOUTH EVERY DAY AS NEEDED FOR SHORTNESS OF BREATH  . spironolactone (ALDACTONE) 25 MG tablet TAKE ONE TABLET BY MOUTH EVERY MORNING and TAKE ONE TABLET BY MOUTH EVERY EVENING  . Tocilizumab (ACTEMRA) 200 MG/10ML SOLN Inject into the vein every 30 (thirty) days.  Marland Kitchen torsemide (DEMADEX) 20 MG tablet Take 1 tablet Monday, Wednesday , and Friday  . traMADol (ULTRAM) 50 MG tablet Take 1 tablet (50 mg total) by mouth every 6 (six) hours as needed.  . traZODone (DESYREL) 50 MG tablet Take 50 mg by mouth at bedtime.  Tyler Aas FLEXTOUCH 100 UNIT/ML FlexTouch Pen Inject 0.14 mLs (14 Units total) into the skin daily.  . Vitamin D,  Ergocalciferol, (DRISDOL) 1.25 MG (50000 UNIT) CAPS capsule TAKE 1 CAPSULE BY MOUTH 2 TIMES EVERY WEEK     Allergies:   Hydromorphone, Iodinated diagnostic agents, Other, Erythromycin, Latex, Tape, and Mircette [desogestrel-ethinyl estradiol]   Social History   Tobacco Use  . Smoking status: Never Smoker  . Smokeless tobacco: Never Used  Vaping Use  . Vaping Use: Never used  Substance Use Topics  . Alcohol use: No  . Drug use: No     Family Hx: The patient's family history includes Allergies in her father and mother; Arthritis in her mother; Asthma in her maternal grandmother; Cancer in her paternal grandfather; Cushing syndrome in her father; Dementia in her paternal grandmother; Depression in her father; Diabetes in her maternal grandmother; Drug abuse in her mother; Heart attack in her father and maternal grandfather; Heart murmur in her mother; Hypertension in her maternal grandmother. There is no history of Breast cancer.  ROS:   Please see the history of present illness.    Review of Systems  Constitutional: Negative.   Respiratory: Negative.   Cardiovascular: Negative.   Musculoskeletal: Positive for back pain (low back pain ).  Neurological: Negative for dizziness and tingling.  Psychiatric/Behavioral: Negative.     All  other systems reviewed and are negative.   Labs/Other Tests and Data Reviewed:    Recent Labs: 04/12/2020: TSH 0.652 04/19/2020: B Natriuretic Peptide 71.3 08/30/2020: ALT 14; BUN 16; Creatinine, Ser 1.02; Hemoglobin 11.2; Platelets 238; Potassium 3.7; Sodium 143   Recent Lipid Panel Lab Results  Component Value Date/Time   CHOL 196 08/30/2020 05:29 PM   TRIG 227 (H) 08/30/2020 05:29 PM   HDL 47 08/30/2020 05:29 PM   CHOLHDL 4.2 08/30/2020 05:29 PM   LDLCALC 110 (H) 08/30/2020 05:29 PM    Wt Readings from Last 3 Encounters:  08/31/20 191 lb 6.4 oz (86.8 kg)  08/30/20 189 lb (85.7 kg)  08/30/20 189 lb 9.6 oz (86 kg)     Exam:    Vital Signs:  There were no vitals taken for this visit.    Physical Exam Vitals reviewed.  Constitutional:      General: She is not in acute distress.    Appearance: Normal appearance.  Pulmonary:     Effort: Pulmonary effort is normal. No respiratory distress.     Breath sounds: No wheezing.  Skin:    Capillary Refill: Capillary refill takes less than 2 seconds.  Neurological:     General: No focal deficit present.     Mental Status: She is alert and oriented to person, place, and time.     Cranial Nerves: No cranial nerve deficit.  Psychiatric:        Mood and Affect: Mood normal.        Behavior: Behavior normal.        Thought Content: Thought content normal.        Judgment: Judgment normal.     ASSESSMENT & PLAN:    1. Chronic bilateral low back pain without sciatica  Would like referral for aquatic physical therapy due to ongoing back pain which is worse when lying down.   She has been doing home PT with [facility] which has helped but continues to have the pain   COVID-19 Education: The signs and symptoms of COVID-19 were discussed with the patient and how to seek care for testing (follow up with PCP or arrange E-visit).  The importance of social distancing was discussed today.  Patient Risk:  After full review of this  patients clinical status, I feel that they are at least moderate risk at this time.  Time:   Today, I have spent 12 minutes/ seconds with the patient with telehealth technology discussing above diagnoses.     Medication Adjustments/Labs and Tests Ordered: Current medicines are reviewed at length with the patient today.  Concerns regarding medicines are outlined above.   Tests Ordered: Orders Placed This Encounter  Procedures  . Ambulatory referral to Physical Therapy    Medication Changes: No orders of the defined types were placed in this encounter.   Disposition:  Follow up prn  Signed, [name], FNP

## 2020-10-10 ENCOUNTER — Encounter: Payer: Self-pay | Admitting: Nurse Practitioner

## 2020-10-10 ENCOUNTER — Other Ambulatory Visit: Payer: Self-pay | Admitting: Nurse Practitioner

## 2020-10-10 DIAGNOSIS — N6320 Unspecified lump in the left breast, unspecified quadrant: Secondary | ICD-10-CM

## 2020-10-11 DIAGNOSIS — E1165 Type 2 diabetes mellitus with hyperglycemia: Secondary | ICD-10-CM | POA: Diagnosis not present

## 2020-10-12 ENCOUNTER — Ambulatory Visit: Payer: Medicare Other

## 2020-10-12 DIAGNOSIS — E1169 Type 2 diabetes mellitus with other specified complication: Secondary | ICD-10-CM

## 2020-10-12 DIAGNOSIS — I10 Essential (primary) hypertension: Secondary | ICD-10-CM

## 2020-10-12 DIAGNOSIS — N6320 Unspecified lump in the left breast, unspecified quadrant: Secondary | ICD-10-CM

## 2020-10-12 NOTE — Patient Instructions (Signed)
Social Worker Visit Information  Goals we discussed today:  Goals Addressed            This Visit's Progress   . Collaborate with RN Care Manager to perform appropriate assessments to assist with care coordination needs   On track    [place] (see longitudinal plan of care for additional care plan information)  Current Barriers:  . Car broke down; patient having to use funds to pay for transportation to and from physician appointments . Patient reports having to use money set aside for mortgage to pay for car to be fixed . Limited social support . Limited knowledge of health plan transportation benefit . Chronic conditions including CHF, DM II, and HTN which put patient at increased risk of hospitalization  Social Work Clinical Goal(s):  Marland Kitchen Over the next 90 days the patient will work with SW to assist with ongoing care coordination needs  CCM SW Interventions: Completed 10/12/20 . Performed chart review to note patient requested referral to [facility] o Referral placed by provider on 10/10/20 . Successful outbound call placed to the patient to confirm contact by [facility] o Confirmed patient upcoming appointment to see Dr. Elisha Headland with the [facility] on 10/20/20 at 9:30am . No acute care coordination needs identified during today's call . Scheduled follow up call to the patient over the next month  Completed 10/04/20 . Inter-disciplinary care team collaboration (see longitudinal plan of care) . Successful outbound call placed to the patient to conduct an SDoH screen . Determined the patient is facing financial struggles due to her car breaking down and needing to pay the mechanic o "I am going to have to use the money for my mortgage to pay to get my truck back. I have to get my truck back because I am spending all my money on transportation to the doctor" . Discussed the patient has recently had a breast biopsy due to results  from mammogram o The patient is worried she will have to go back and forth to [facility] several times pending results and may not be able to afford the transportation . Educated the patient on her [place] transportation benefit which will transport the patient up to 50 miles one way for physician appointments . Provided the patient with information on how to apply for financial assistance for her utilities and mortgage through [employer] as well as Soil scientist . Scheduled follow up call to the patient over the next two weeks  Patient Self Care Activities:  . Patient verbalizes understanding of plan to apply for financial assistance via provided web links . Self administers medications as prescribed . Attends all scheduled provider appointments . Performs ADL's independently . Performs IADL's independently . Calls provider office for new concerns or questions  Please see past updates related to this goal by clicking on the "Past Updates" button in the selected goal          Follow Up Plan: SW will follow up with patient by phone over the next month.   Daneen Schick, BSW, CDP Social Worker, Certified Dementia Practitioner [place] [facility] [place] [facility] [phone]

## 2020-10-12 NOTE — Chronic Care Management (AMB) (Signed)
Chronic Care Management    Social Work Follow Up Note  10/12/2020 Name: Daven Pinckney MRN: [id number] DOB: Aug 15, 1971  Christine Mcdowell is a 49 y.o. year old female who is a primary care patient of [name], MD. The CCM team was consulted for assistance with care coordination.   Review of patient status, including review of consultants reports, other relevant assessments, and collaboration with appropriate care team members and the patient's provider was performed as part of comprehensive patient evaluation and provision of chronic care management services.    SDOH (Social Determinants of Health) assessments performed: No    Outpatient Encounter Medications as of 10/12/2020  Medication Sig  . albuterol (ACCUNEB) 1.25 MG/3ML nebulizer solution Take 3 mLs (1.25 mg total) by nebulization every 6 (six) hours as needed for wheezing.  Marland Kitchen aspirin 81 MG chewable tablet Chew 162 mg by mouth daily. Taking 2 tablets  . atorvastatin (LIPITOR) 10 MG tablet Take 1 tablet (10 mg total) by mouth daily.  . Azelastine HCl 137 MCG/SPRAY SOLN Place 2 sprays into the nose daily.  . Bacillus Coagulans-Inulin (PROBIOTIC) 1-250 BILLION-MG CAPS Take 1 capsule by mouth daily.  . blood glucose meter kit and supplies KIT Dispense based on patient and insurance preference. Use to check blood sugars daily E11.9 (Patient not taking: Reported on 10/09/2020)  . carvedilol (COREG) 25 MG tablet TAKE 1 TABLET(25 MG) BY MOUTH TWICE DAILY WITH A MEAL  . Continuous Blood Gluc Receiver (DEXCOM G6 RECEIVER) DEVI 1 Device by Does not apply route 3 (three) times daily before meals. (Patient not taking: Reported on 10/09/2020)  . Continuous Blood Gluc Sensor (DEXCOM G6 SENSOR) MISC Inject 1 each into the skin 3 (three) times daily. (Patient not taking: Reported on 10/09/2020)  . Continuous Blood Gluc Transmit (DEXCOM G6 TRANSMITTER) MISC 1 Device by Does not apply route 3 (three) times daily before meals. (Patient not  taking: Reported on 10/09/2020)  . diclofenac Sodium (VOLTAREN) 1 % GEL APPLY SMALL AMOUNT TO INVOLVED JOINTS UP TO TWICE DAILY  . ENTRESTO 97-103 MG TAKE ONE TABLET BY MOUTH EVERY MORNING and TAKE ONE TABLET BY MOUTH EVERY EVENING  . folic acid (FOLVITE) 1 MG tablet TAKE 1 TABLET BY MOUTH EVERY MORNING  . hydrALAZINE (APRESOLINE) 25 MG tablet Take 3 tablets (75 mg total) by mouth 3 (three) times daily.  . hydroxychloroquine (PLAQUENIL) 200 MG tablet Take 1 tablet (200 mg total) by mouth 2 (two) times daily.  . insulin lispro (HUMALOG) 100 UNIT/ML injection Inject insulin according to sliding scale 3 times a day before meals.  . Magnesium 400 MG TABS Take 1 tablet by mouth daily.   . meloxicam (MOBIC) 15 MG tablet Take 15 mg by mouth daily.   . metFORMIN (GLUCOPHAGE) 500 MG tablet TAKE ONE TABLET BY MOUTH EVERY MORNING and TAKE ONE TABLET BY MOUTH EVERY EVENING  . methotrexate (RHEUMATREX) 2.5 MG tablet Take 20 mg by mouth every Friday.   . metolazone (ZAROXOLYN) 2.5 MG tablet TAKE 1 TABLET BY MOUTH AS NEEDED FOR WEIGHT GAIN OF 5 POUNDS WITHIN 3 DAYS AS DIRECTED  . montelukast (SINGULAIR) 10 MG tablet TAKE ONE TABLET BY MOUTH EVERY MORNING  . Multiple Vitamin (MULTIVITAMIN WITH MINERALS) TABS Take 1 tablet by mouth every morning.   . ondansetron (ZOFRAN-ODT) 4 MG disintegrating tablet Take 1 tablet (4 mg total) by mouth as needed for nausea.  . potassium chloride SA (KLOR-CON) 20 MEQ tablet TAKE TWO TABLETS BY MOUTH EVERY MORNING  . PROAIR HFA 108 (  90 Base) MCG/ACT inhaler INHALE 2 PUFFS BY MOUTH EVERY DAY AS NEEDED FOR SHORTNESS OF BREATH  . spironolactone (ALDACTONE) 25 MG tablet TAKE ONE TABLET BY MOUTH EVERY MORNING and TAKE ONE TABLET BY MOUTH EVERY EVENING  . Tocilizumab (ACTEMRA) 200 MG/10ML SOLN Inject into the vein every 30 (thirty) days.  Marland Kitchen torsemide (DEMADEX) 20 MG tablet Take 1 tablet Monday, Wednesday , and Friday  . traMADol (ULTRAM) 50 MG tablet Take 1 tablet (50 mg total) by mouth  every 6 (six) hours as needed.  . traZODone (DESYREL) 50 MG tablet Take 50 mg by mouth at bedtime.  Tyler Aas FLEXTOUCH 100 UNIT/ML FlexTouch Pen Inject 0.14 mLs (14 Units total) into the skin daily.  . Vitamin D, Ergocalciferol, (DRISDOL) 1.25 MG (50000 UNIT) CAPS capsule TAKE 1 CAPSULE BY MOUTH 2 TIMES EVERY WEEK   No facility-administered encounter medications on file as of 10/12/2020.     Goals Addressed            This Visit's Progress   . Collaborate with RN Care Manager to perform appropriate assessments to assist with care coordination needs   On track    [place] (see longitudinal plan of care for additional care plan information)  Current Barriers:  . Car broke down; patient having to use funds to pay for transportation to and from physician appointments . Patient reports having to use money set aside for mortgage to pay for car to be fixed . Limited social support . Limited knowledge of health plan transportation benefit . Chronic conditions including CHF, DM II, and HTN which put patient at increased risk of hospitalization  Social Work Clinical Goal(s):  Marland Kitchen Over the next 90 days the patient will work with SW to assist with ongoing care coordination needs  CCM SW Interventions: Completed 10/12/20 . Performed chart review to note patient requested referral to [facility] o Referral placed by provider on 10/10/20 . Successful outbound call placed to the patient to confirm contact by [place] o Confirmed patient upcoming appointment to see Dr. Elisha Headland with the [facility] on 10/20/20 at 9:30am . No acute care coordination needs identified during today's call . Scheduled follow up call to the patient over the next month  Completed 10/04/20 . Inter-disciplinary care team collaboration (see longitudinal plan of care) . Successful outbound call placed to the patient to conduct an [facility] screen . Determined the patient is  facing financial struggles due to her car breaking down and needing to pay the mechanic o "I am going to have to use the money for my mortgage to pay to get my truck back. I have to get my truck back because I am spending all my money on transportation to the doctor" . Discussed the patient has recently had a breast biopsy due to results from mammogram o The patient is worried she will have to go back and forth to [facility] several times pending results and may not be able to afford the transportation . Educated the patient on her [place] transportation benefit which will transport the patient up to 50 miles one way for physician appointments . Provided the patient with information on how to apply for financial assistance for her utilities and mortgage through [employer] as well as [employer] . Scheduled follow up call to the patient over the next two weeks  Patient Self Care Activities:  . Patient verbalizes understanding of plan to apply for financial assistance  via provided web links . Self administers medications as prescribed . Attends all scheduled provider appointments . Performs ADL's independently . Performs IADL's independently . Calls provider office for new concerns or questions  Please see past updates related to this goal by clicking on the "Past Updates" button in the selected goal          Follow Up Plan: SW will follow up with patient by phone over the next month.   Daneen Schick, BSW, CDP Social Worker, Certified Dementia Practitioner [place] [facility] [place] [facility] [phone]  Total time spent performing care coordination and/or care management activities with the patient by phone or face to face = 8 minutes.

## 2020-10-16 ENCOUNTER — Telehealth: Payer: Self-pay

## 2020-10-16 ENCOUNTER — Telehealth: Payer: Medicare Other

## 2020-10-16 NOTE — Telephone Encounter (Cosign Needed)
  Chronic Care Management   Outreach Note  10/16/2020 Name: Christine Mcdowell MRN: [id number] DOB: April 01, 1971  Referred by: [name], MD Reason for referral : Chronic Care Management (CCM RNCM FU Call )   An unsuccessful telephone outreach was attempted today. The patient was referred to the case management team for assistance with care management and care coordination.   Follow Up Plan: Telephone follow up appointment with care management team member scheduled for: 10/26/20  Barb Merino, RN, BSN, CCM Care Management Coordinator [place] [facility]  Direct Phone: [phone]

## 2020-10-19 ENCOUNTER — Ambulatory Visit: Payer: Medicare Other | Admitting: Allergy

## 2020-10-20 DIAGNOSIS — E119 Type 2 diabetes mellitus without complications: Secondary | ICD-10-CM | POA: Diagnosis not present

## 2020-10-20 DIAGNOSIS — Z794 Long term (current) use of insulin: Secondary | ICD-10-CM | POA: Diagnosis not present

## 2020-10-23 ENCOUNTER — Ambulatory Visit (INDEPENDENT_AMBULATORY_CARE_PROVIDER_SITE_OTHER): Payer: Medicare Other

## 2020-10-23 DIAGNOSIS — Z95810 Presence of automatic (implantable) cardiac defibrillator: Secondary | ICD-10-CM | POA: Diagnosis not present

## 2020-10-23 DIAGNOSIS — I5022 Chronic systolic (congestive) heart failure: Secondary | ICD-10-CM | POA: Diagnosis not present

## 2020-10-23 DIAGNOSIS — N62 Hypertrophy of breast: Secondary | ICD-10-CM | POA: Diagnosis not present

## 2020-10-23 DIAGNOSIS — N6022 Fibroadenosis of left breast: Secondary | ICD-10-CM | POA: Diagnosis not present

## 2020-10-23 DIAGNOSIS — N6489 Other specified disorders of breast: Secondary | ICD-10-CM | POA: Diagnosis not present

## 2020-10-25 DIAGNOSIS — N6022 Fibroadenosis of left breast: Secondary | ICD-10-CM | POA: Diagnosis not present

## 2020-10-25 DIAGNOSIS — N63 Unspecified lump in unspecified breast: Secondary | ICD-10-CM | POA: Diagnosis not present

## 2020-10-25 NOTE — Progress Notes (Signed)
EPIC Encounter for ICM Monitoring  Patient Name: Christine Mcdowell is a 49 y.o. female Date: 10/25/2020 Primary Care Physican: [name], MD Primary Cardiologist:Bensimhon Electrophysiologist:Taylor LastWeight:184lbs  Transmission reviewed.  10/31/2021[employer] Heart Failure Index6 suggestingnormal fluid levels  Prescribed:  Torsemide20 mg take1tablet (20 mg total)Mon, Wed and Friday.Per Dr Clayborne Dana 08/21/2020 noteCan take extra torsemide or metolazone prn as needed  Potassium 20 mEq take2tabletsdaily.  Spironolactone 25 mg take 1 tablet daily.  Metolazone 2.5 mg takeone tablet as needed for weight gain of 5 lbs within 3 days.   Labs: 06/05/2020 Creatinine 1.93, BUN 28, Potassium 3.7, Sodium 139, GFR 29-34 05/01/2020 Creatinine0.81, BUN14, Potassium3.3, Sodium143, GFR>60 04/19/2020 Creatinine0.81, BUN11, Potassium3.2, EAKLTY757, GFR>60  01/31/2020 Creatinine0.88, BUN13, Potassium3.4, Sodium141, GFR>60 A complete set of results can be found in Results Review.  Recommendations:No changes.   Follow-up plan: ICM clinic phone appointment on12/06/2020. 91 day device clinic remote transmission12/07/2020.   EP/Cardiology next office visit: Recall for 09/01/2021 withDr. Lovena Le        Copy of ICM check sent to Dr. Lovena Le.  3 Month Trend    8 Day Data Trend          [name], RN 10/25/2020 2:30 PM

## 2020-10-26 ENCOUNTER — Telehealth: Payer: Medicare Other

## 2020-10-27 ENCOUNTER — Telehealth (HOSPITAL_COMMUNITY): Payer: Self-pay | Admitting: *Deleted

## 2020-10-27 NOTE — Telephone Encounter (Signed)
Pt called to report she has been more short of breath since Tuesday. Pt wasn't sure if it was allergy/asthma related so she took a breathing treatment and that did not help. Pt said she can walk a very short distance and it is hard for her to catch her breath. Pt denies wheezing, swelling, chest pain, cough, fever, and fatigue. Pt said her weight is stable. Pt said she is experiencing seasonal allergies watery eyes and runny nose. Pt wanted Dr.Bensimhon to be aware just in case he thought this was cardiac related.  Routed to [place] for advice

## 2020-10-29 NOTE — Telephone Encounter (Signed)
Volume has been good lately, If weight stable suspect allergies. If symptoms persist we can see her.

## 2020-10-30 ENCOUNTER — Telehealth: Payer: Self-pay | Admitting: Pharmacist

## 2020-10-30 NOTE — Telephone Encounter (Signed)
Attempted to reach pt by phone no answer/no voicemail set up.

## 2020-10-30 NOTE — Chronic Care Management (AMB) (Signed)
Chronic Care Management Pharmacy Assistant   Name: Christine Mcdowell  MRN: [id number] DOB: [birth year]  Reason for Encounter: Medication Review/ Monthly Dispensing Call  Patient Questions:  1.  Have you seen any other providers since your last visit? Yes, 10/13/2021Tillie Rung Mcdowell (CCM LSW), 10/09/2020- [facility] (Rheumatology), 10/09/2020- [name] (PCP), 10/12/2020- Daneen Schick (CCM LSW), 10/25/2020- Dr Genia Hotter (General Surgery).  2.  Any changes in your medicines or health? Yes, Ipratropium NS discontinued  And Prednisone 10 mg discontinued all on 10/09/2020.   PCP : [name], MD  Allergies:   Allergies  Allergen Reactions  . Hydromorphone Nausea And Vomiting    Other reaction(s): GI Upset (intolerance), Hypertension (intolerance) Raises blood pressure  Other reaction(s): GI Upset (intolerance), Hypertension (intolerance) Raises blood pressure to stroke level  . Iodinated Diagnostic Agents Other (See Comments)    Shuts down kidneys Shuts kidney function down  . Other Other (See Comments) and Anaphylaxis    Spicy foods and seasonings Skin Prep "makes my skin peel off" Paper tape causes skin burns  . Erythromycin Nausea And Vomiting  . Latex Hives  . Tape Other (See Comments)    "Skin burns"  . Mircette [Desogestrel-Ethinyl Estradiol] Nausea And Vomiting and Rash    Medications: Outpatient Encounter Medications as of 10/30/2020  Medication Sig  . albuterol (ACCUNEB) 1.25 MG/3ML nebulizer solution Take 3 mLs (1.25 mg total) by nebulization every 6 (six) hours as needed for wheezing.  Christine Mcdowell aspirin 81 MG chewable tablet Chew 162 mg by mouth daily. Taking 2 tablets  . atorvastatin (LIPITOR) 10 MG tablet Take 1 tablet (10 mg total) by mouth daily.  . Azelastine HCl 137 MCG/SPRAY SOLN Place 2 sprays into the nose daily.  . Bacillus Coagulans-Inulin (PROBIOTIC) 1-250 BILLION-MG CAPS Take 1 capsule by mouth daily.  . blood glucose meter kit and supplies KIT  Dispense based on patient and insurance preference. Use to check blood sugars daily E11.9 (Patient not taking: Reported on 10/09/2020)  . carvedilol (COREG) 25 MG tablet TAKE 1 TABLET(25 MG) BY MOUTH TWICE DAILY WITH A MEAL  . Continuous Blood Gluc Receiver (DEXCOM G6 RECEIVER) DEVI 1 Device by Does not apply route 3 (three) times daily before meals. (Patient not taking: Reported on 10/09/2020)  . Continuous Blood Gluc Sensor (DEXCOM G6 SENSOR) MISC Inject 1 each into the skin 3 (three) times daily. (Patient not taking: Reported on 10/09/2020)  . Continuous Blood Gluc Transmit (DEXCOM G6 TRANSMITTER) MISC 1 Device by Does not apply route 3 (three) times daily before meals. (Patient not taking: Reported on 10/09/2020)  . diclofenac Sodium (VOLTAREN) 1 % GEL APPLY SMALL AMOUNT TO INVOLVED JOINTS UP TO TWICE DAILY  . ENTRESTO 97-103 MG TAKE ONE TABLET BY MOUTH EVERY MORNING and TAKE ONE TABLET BY MOUTH EVERY EVENING  . folic acid (FOLVITE) 1 MG tablet TAKE 1 TABLET BY MOUTH EVERY MORNING  . hydrALAZINE (APRESOLINE) 25 MG tablet Take 3 tablets (75 mg total) by mouth 3 (three) times daily.  . hydroxychloroquine (PLAQUENIL) 200 MG tablet Take 1 tablet (200 mg total) by mouth 2 (two) times daily.  . insulin lispro (HUMALOG) 100 UNIT/ML injection Inject insulin according to sliding scale 3 times a day before meals.  . Magnesium 400 MG TABS Take 1 tablet by mouth daily.   . meloxicam (MOBIC) 15 MG tablet Take 15 mg by mouth daily.   . metFORMIN (GLUCOPHAGE) 500 MG tablet TAKE ONE TABLET BY MOUTH EVERY MORNING and TAKE ONE TABLET BY MOUTH  EVERY EVENING  . methotrexate (RHEUMATREX) 2.5 MG tablet Take 20 mg by mouth every Friday.   . metolazone (ZAROXOLYN) 2.5 MG tablet TAKE 1 TABLET BY MOUTH AS NEEDED FOR WEIGHT GAIN OF 5 POUNDS WITHIN 3 DAYS AS DIRECTED  . montelukast (SINGULAIR) 10 MG tablet TAKE ONE TABLET BY MOUTH EVERY MORNING  . Multiple Vitamin (MULTIVITAMIN WITH MINERALS) TABS Take 1 tablet by mouth  every morning.   . ondansetron (ZOFRAN-ODT) 4 MG disintegrating tablet Take 1 tablet (4 mg total) by mouth as needed for nausea.  . potassium chloride SA (KLOR-CON) 20 MEQ tablet TAKE TWO TABLETS BY MOUTH EVERY MORNING  . PROAIR HFA 108 (90 Base) MCG/ACT inhaler INHALE 2 PUFFS BY MOUTH EVERY DAY AS NEEDED FOR SHORTNESS OF BREATH  . spironolactone (ALDACTONE) 25 MG tablet TAKE ONE TABLET BY MOUTH EVERY MORNING and TAKE ONE TABLET BY MOUTH EVERY EVENING  . Tocilizumab (ACTEMRA) 200 MG/10ML SOLN Inject into the vein every 30 (thirty) days.  Christine Mcdowell torsemide (DEMADEX) 20 MG tablet Take 1 tablet Monday, Wednesday , and Friday  . traMADol (ULTRAM) 50 MG tablet Take 1 tablet (50 mg total) by mouth every 6 (six) hours as needed.  . traZODone (DESYREL) 50 MG tablet Take 50 mg by mouth at bedtime.  Christine Mcdowell FLEXTOUCH 100 UNIT/ML FlexTouch Pen Inject 0.14 mLs (14 Units total) into the skin daily.  . Vitamin D, Ergocalciferol, (DRISDOL) 1.25 MG (50000 UNIT) CAPS capsule TAKE 1 CAPSULE BY MOUTH 2 TIMES EVERY WEEK   No facility-administered encounter medications on file as of 10/30/2020.    Current Diagnosis: Patient Active Problem List   Diagnosis Date Noted  . 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 History of COVID-19 04/12/2020  . COVID-19 virus infection 03/07/2020  . Lower abdominal pain 09/07/2019  . Chronic systolic (congestive) heart failure ([place]) 01/28/2018  . Cough 01/19/2018  . Pulmonary infiltrates on CXR 01/19/2018  . Migraines 10/08/2017  . Sleep apnea with use of continuous positive airway pressure (CPAP) 09/24/2017  . Congestive heart failure (CHF) ([place]) 10/09/2016  . Rheumatoid arthritis ([place]) 10/09/2016  . Asthma 10/09/2016  . Chronic pain 10/09/2016  . Heart failure ([place]) 10/09/2016  . Congestive heart failure ([place])   . Lupus (systemic lupus erythematosus) ([place])   . Diabetes mellitus with complication ([place])   . Anxiety state   . GERD (gastroesophageal  reflux disease) 12/28/2015  . Essential hypertension 12/26/2015  . Type 2 diabetes mellitus ([place]) 12/26/2015  . Chronic systolic heart failure ([place]) 08/18/2014  . 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 Shortness of breath 01/26/2014  . SLE (systemic lupus erythematosus) ([place]) 01/26/2014   Reviewed chart for medication changes ahead of medication coordination call.  OVs, Consults- 10/04/2020- Kendra Mcdowell (CCM LSW), 10/09/2020- [facility] (Rheumatology), 10/09/2020- [name] (PCP), 10/12/2020- Daneen Schick (CCM LSW), 10/25/2020- Dr Genia Hotter (General Surgery) since last care coordination call/Pharmacist visit.  Medication changes indicated:  Ipratropium NS discontinued  And Prednisone 10 mg discontinued all on 10/09/2020.    BP Readings from Last 3 Encounters:  08/31/20 118/68  08/30/20 136/76  08/30/20 132/76    Lab Results  Component Value Date   HGBA1C 6.9 (H) 08/30/2020     Patient obtains medications through Adherence Packaging  30 Days   Last adherence delivery included:  Vitamin D 50,000 units- one capsules two times a week (Tuesday and Friday)  Torsemide 20 mg- one tablet Monday, Wednesday and Friday  Diclofenac 1% topical gel- apply small amount to joints twice a  day Metformin 500 mg- one tablet twice a day Montelukast 10 mg- one tablet daily Hydroxychloroquine 200 mg- one tablet by mouth twice daily  Meloxicam 15 mg- One tablet by mouth daily  Trazodone 50 mg- one tablet at bedtime Atorvastatin 10 mg- one tablet at bedtime  Carvedilol 25 mg- One tablet twice a day Potassium CL ER 20 Meq- 2 tablets by mouth daily Entresto 97 mg/103 mg- one tablet twice a day Spironolactone 25 mg- one tablet twice a day  Methotrexate Sodium 2.5 mg-one tablet by mouth every Friday Ondansetron 4 mg- dissolve one tablet under tongue every twelve hours as needed for nausea.   Patient declined the following medications last month: Prednisone 10 mg due to being  discontinued.  Patient is due for next adherence delivery on: 10/30/2020 . Called patient and reviewed medications and coordinated delivery, while on the phone with the patient, phone disconnected. Called twice, phone not available, tried again this afternoon, phone not available. Called Christell Constant [facility]) unable to leave message, voicemail box full. Christell Constant (EC) returned call, she is patient's daughter, informed about lost contact with patient and my concerns. Daughter aware, will try to contact mother and let me know. 11/08/2020- Called patient again, spoke with patient she informed that her phone line was disconnected and she did not know why, apologized for not return call. We reviewed medications and coordinated delivery.  This delivery to include: Vitamin D 50,000 units- one capsules two times a week, Tuesday and Friday (breakfast) Torsemide 20 mg- one tablet Monday, Wednesday and Friday (breakfast) Metformin 500 mg- one tablet twice a day (breakfast and evening meal) Montelukast 10 mg- one tablet daily( breakfast) Hydroxychloroquine 200 mg- one tablet by mouth twice daily (breakfast and evening meal)  Meloxicam 15 mg- One tablet by mouth daily (breakfast)  Trazodone 50 mg- one tablet at bedtime VIAL-EASY OPEN Atorvastatin 10 mg- one tablet daily (bedtime)  Carvedilol 25 mg- One tablet twice a day (breakfast and evening meal) Potassium CL ER 20 Meq- 2 tablets by mouth daily (breakfast and evening meal) Entresto 97 mg/103 mg- one tablet twice a day (breakfast and evening meal) Spironolactone 25 mg- one tablet twice a day (breakfast and evening meal)  Methotrexate Sodium 2.5 mg- eight tablets by mouth once weekly on  Friday (breakfast) Ondansetron 4 mg- dissolve one tablet under tongue every twelve hours as needed for nausea. VIAL (Dissolvable tablets please) Hydralazine 25 mg- 3 tablets three times daily (breakfast, evening meal and bedtime) Diclofenac 1 % gel- Apply small amount  to joints twice daily. Humalog Kwikpen- Inject 17 units sq three times daily with meals  Folic Acid 1 mg- 1 tablet daily (breakfast) One Touch lancets and test strips- to check blood sugars daily as needed. Pen needles- Use with Humalog to inject twice daily  Short fill or acute fill not needed  Patient declined the following medications Tresiba FlexPen due to not being covered by insurance- discontinued, Novolog due to using an adequate supply, uses as a sliding scale.  Patient needs refills for Methotrexate, Hydroxychloroquine, Meloxicam and Vitamin D.  Confirmed delivery date of 11/10/2020, advised patient that pharmacy will contact them the morning of delivery.  Follow-Up:  Care Coordination with Outside Provider, Coordination of Enhanced Pharmacy Services and Pharmacist Review  11/08/2020- Patient would like to change packaging times on a few medications. Patient would only breakfast, evening meal and bedtime packages, no lunch. Patient states she never has lunch, she eats in the morning then again in the evening and  does not take her medication on an empty stomach. See package changes.   Patient also confused on Hydralazine strength and instructions. Patient thought she was only to take 25 mg three times a day and was confused on why she had 3 (25 mg) tablets in a package, she thought she was being overdosed. Advised that the instructions from Cardiology was for her to take 3 (25 mg) tablets three times a day, totaling 75 mg three times a day. Patient states it was wrong. Informed that I will contact Cardiology office for clarification.  Patient was also having problems with her Dexcom reader, was not working, she contacted [employer] and they were able to handle the problem and sending her out a new reader. Patient was unable to check BS while Dexcom was out. Patient is taking Prednisone, Blood sugars are elevated, having Breast Surgery next month.   Patient is also concerned about the  price of Vitamin D, pen needles and Folic Acid, states she never has to pay for any of her medication and not sure why she was asked for payment last delivery.   11/09/2020- Called Dr Haroldine Laws office, spoke with Delana Meyer, discussed Hydralazine and requested refill of Hydroxychloroquine to [facility]. Confirmed that prescription is right the way it is, Hydralazine 25 mg- 3 tablets three times a day. Patient was also advised by Cardiology.   Called Dr Rogers Blocker office- spoke with Jenny Reichmann, requested refill of Meloxicam and Methotrexate to be sent to [facility].   Sent request to Dr Baird Cancer CMA for refill request of Vitamin D to [facility].  Orlando Penner, CPP notified.  Pattricia Boss, [place] Pharmacist Assistant [phone]

## 2020-11-06 ENCOUNTER — Telehealth: Payer: Self-pay

## 2020-11-06 ENCOUNTER — Telehealth: Payer: Medicare Other

## 2020-11-06 DIAGNOSIS — J45909 Unspecified asthma, uncomplicated: Secondary | ICD-10-CM | POA: Diagnosis not present

## 2020-11-06 DIAGNOSIS — E119 Type 2 diabetes mellitus without complications: Secondary | ICD-10-CM | POA: Diagnosis not present

## 2020-11-06 DIAGNOSIS — J310 Chronic rhinitis: Secondary | ICD-10-CM | POA: Diagnosis not present

## 2020-11-06 DIAGNOSIS — M0579 Rheumatoid arthritis with rheumatoid factor of multiple sites without organ or systems involvement: Secondary | ICD-10-CM | POA: Diagnosis not present

## 2020-11-06 DIAGNOSIS — I5022 Chronic systolic (congestive) heart failure: Secondary | ICD-10-CM | POA: Diagnosis not present

## 2020-11-06 DIAGNOSIS — I420 Dilated cardiomyopathy: Secondary | ICD-10-CM | POA: Diagnosis not present

## 2020-11-06 DIAGNOSIS — E876 Hypokalemia: Secondary | ICD-10-CM | POA: Diagnosis not present

## 2020-11-06 DIAGNOSIS — Z7952 Long term (current) use of systemic steroids: Secondary | ICD-10-CM | POA: Diagnosis not present

## 2020-11-06 NOTE — Telephone Encounter (Signed)
  Chronic Care Management   Outreach Note  11/06/2020 Name: Christine Mcdowell MRN: [id number] DOB: [birth year]  Referred by: [name], MD Reason for referral : Care Coordination   An unsuccessful telephone outreach was attempted today. The patient was referred to the case management team for assistance with care management and care coordination.   Follow Up Plan: A HIPAA compliant phone message was left for the patient providing contact information and requesting a return call.  The care management team will reach out to the patient again over the next 30 days.   Daneen Schick, BSW, CDP Social Worker, Certified Dementia Practitioner [place] [facility] [place] [facility] [phone]

## 2020-11-08 ENCOUNTER — Encounter (HOSPITAL_COMMUNITY): Payer: Self-pay

## 2020-11-09 ENCOUNTER — Other Ambulatory Visit (HOSPITAL_COMMUNITY): Payer: Self-pay | Admitting: *Deleted

## 2020-11-09 ENCOUNTER — Other Ambulatory Visit: Payer: Self-pay | Admitting: Nurse Practitioner

## 2020-11-09 ENCOUNTER — Other Ambulatory Visit: Payer: Self-pay

## 2020-11-09 MED ORDER — NOVOFINE PLUS PEN NEEDLE 32G X 4 MM MISC: 3 refills | Status: DC

## 2020-11-09 MED ORDER — VITAMIN D (ERGOCALCIFEROL) 1.25 MG (50000 UNIT) PO CAPS: ORAL_CAPSULE | ORAL | 0 refills | Status: DC

## 2020-11-11 DIAGNOSIS — E1165 Type 2 diabetes mellitus with hyperglycemia: Secondary | ICD-10-CM | POA: Diagnosis not present

## 2020-11-21 DIAGNOSIS — D688 Other specified coagulation defects: Secondary | ICD-10-CM | POA: Diagnosis not present

## 2020-11-21 DIAGNOSIS — J45909 Unspecified asthma, uncomplicated: Secondary | ICD-10-CM | POA: Diagnosis not present

## 2020-11-21 DIAGNOSIS — N63 Unspecified lump in unspecified breast: Secondary | ICD-10-CM | POA: Diagnosis not present

## 2020-11-21 DIAGNOSIS — E785 Hyperlipidemia, unspecified: Secondary | ICD-10-CM | POA: Diagnosis not present

## 2020-11-21 DIAGNOSIS — I10 Essential (primary) hypertension: Secondary | ICD-10-CM | POA: Diagnosis not present

## 2020-11-21 DIAGNOSIS — I509 Heart failure, unspecified: Secondary | ICD-10-CM | POA: Diagnosis not present

## 2020-11-21 DIAGNOSIS — M069 Rheumatoid arthritis, unspecified: Secondary | ICD-10-CM | POA: Diagnosis not present

## 2020-11-21 DIAGNOSIS — N6323 Unspecified lump in the left breast, lower outer quadrant: Secondary | ICD-10-CM | POA: Diagnosis not present

## 2020-11-21 DIAGNOSIS — Z79899 Other long term (current) drug therapy: Secondary | ICD-10-CM | POA: Diagnosis not present

## 2020-11-21 DIAGNOSIS — R928 Other abnormal and inconclusive findings on diagnostic imaging of breast: Secondary | ICD-10-CM | POA: Diagnosis not present

## 2020-11-21 DIAGNOSIS — M329 Systemic lupus erythematosus, unspecified: Secondary | ICD-10-CM | POA: Diagnosis not present

## 2020-11-21 DIAGNOSIS — E119 Type 2 diabetes mellitus without complications: Secondary | ICD-10-CM | POA: Diagnosis not present

## 2020-11-21 DIAGNOSIS — Z794 Long term (current) use of insulin: Secondary | ICD-10-CM | POA: Diagnosis not present

## 2020-11-24 DIAGNOSIS — N62 Hypertrophy of breast: Secondary | ICD-10-CM | POA: Diagnosis not present

## 2020-11-24 DIAGNOSIS — Z794 Long term (current) use of insulin: Secondary | ICD-10-CM | POA: Diagnosis not present

## 2020-11-24 DIAGNOSIS — E785 Hyperlipidemia, unspecified: Secondary | ICD-10-CM | POA: Diagnosis not present

## 2020-11-24 DIAGNOSIS — E119 Type 2 diabetes mellitus without complications: Secondary | ICD-10-CM | POA: Diagnosis not present

## 2020-11-24 DIAGNOSIS — N63 Unspecified lump in unspecified breast: Secondary | ICD-10-CM | POA: Diagnosis not present

## 2020-11-24 DIAGNOSIS — D688 Other specified coagulation defects: Secondary | ICD-10-CM | POA: Diagnosis not present

## 2020-11-24 DIAGNOSIS — L905 Scar conditions and fibrosis of skin: Secondary | ICD-10-CM | POA: Diagnosis not present

## 2020-11-24 DIAGNOSIS — R921 Mammographic calcification found on diagnostic imaging of breast: Secondary | ICD-10-CM | POA: Diagnosis not present

## 2020-11-24 DIAGNOSIS — N6323 Unspecified lump in the left breast, lower outer quadrant: Secondary | ICD-10-CM | POA: Diagnosis not present

## 2020-11-24 DIAGNOSIS — I10 Essential (primary) hypertension: Secondary | ICD-10-CM | POA: Diagnosis not present

## 2020-11-24 DIAGNOSIS — I509 Heart failure, unspecified: Secondary | ICD-10-CM | POA: Diagnosis not present

## 2020-11-24 DIAGNOSIS — Z79899 Other long term (current) drug therapy: Secondary | ICD-10-CM | POA: Diagnosis not present

## 2020-11-24 DIAGNOSIS — N6012 Diffuse cystic mastopathy of left breast: Secondary | ICD-10-CM | POA: Diagnosis not present

## 2020-11-24 DIAGNOSIS — J45909 Unspecified asthma, uncomplicated: Secondary | ICD-10-CM | POA: Diagnosis not present

## 2020-11-24 DIAGNOSIS — R928 Other abnormal and inconclusive findings on diagnostic imaging of breast: Secondary | ICD-10-CM | POA: Diagnosis not present

## 2020-11-24 DIAGNOSIS — N6022 Fibroadenosis of left breast: Secondary | ICD-10-CM | POA: Diagnosis not present

## 2020-11-24 DIAGNOSIS — N6489 Other specified disorders of breast: Secondary | ICD-10-CM | POA: Diagnosis not present

## 2020-11-28 ENCOUNTER — Ambulatory Visit (INDEPENDENT_AMBULATORY_CARE_PROVIDER_SITE_OTHER): Payer: Medicare Other

## 2020-11-28 DIAGNOSIS — I5022 Chronic systolic (congestive) heart failure: Secondary | ICD-10-CM | POA: Diagnosis not present

## 2020-11-28 DIAGNOSIS — Z95810 Presence of automatic (implantable) cardiac defibrillator: Secondary | ICD-10-CM | POA: Diagnosis not present

## 2020-11-29 ENCOUNTER — Other Ambulatory Visit: Payer: Self-pay

## 2020-11-29 ENCOUNTER — Ambulatory Visit (INDEPENDENT_AMBULATORY_CARE_PROVIDER_SITE_OTHER): Payer: Medicare Other | Admitting: Nurse Practitioner

## 2020-11-29 ENCOUNTER — Other Ambulatory Visit (HOSPITAL_COMMUNITY)
Admission: RE | Admit: 2020-11-29 | Discharge: 2020-11-29 | Disposition: A | Discharge status: Home / Self Care | Payer: Medicare Other | Source: Ambulatory Visit | Attending: Nurse Practitioner | Admitting: Nurse Practitioner

## 2020-11-29 ENCOUNTER — Encounter: Payer: Self-pay | Admitting: Nurse Practitioner

## 2020-11-29 ENCOUNTER — Ambulatory Visit (INDEPENDENT_AMBULATORY_CARE_PROVIDER_SITE_OTHER): Payer: Medicare Other

## 2020-11-29 VITALS — BP 118/76 | HR 82 | Temp 98.7°F | Ht 65.0 in | Wt 191.0 lb

## 2020-11-29 DIAGNOSIS — N898 Other specified noninflammatory disorders of vagina: Secondary | ICD-10-CM

## 2020-11-29 DIAGNOSIS — E1169 Type 2 diabetes mellitus with other specified complication: Secondary | ICD-10-CM

## 2020-11-29 DIAGNOSIS — I420 Dilated cardiomyopathy: Secondary | ICD-10-CM

## 2020-11-29 DIAGNOSIS — R232 Flushing: Secondary | ICD-10-CM

## 2020-11-29 DIAGNOSIS — Z1211 Encounter for screening for malignant neoplasm of colon: Secondary | ICD-10-CM | POA: Diagnosis not present

## 2020-11-29 MED ORDER — GVOKE HYPOPEN 2-PACK 1 MG/0.2ML ~~LOC~~ SOAJ: 1.0000 mg | SUBCUTANEOUS | 5 refills | Status: DC | PRN

## 2020-11-29 MED ORDER — PAROXETINE HCL 10 MG PO TABS: 10.0000 mg | ORAL_TABLET | Freq: Every day | ORAL | 2 refills | Status: DC

## 2020-11-29 NOTE — Patient Instructions (Signed)

## 2020-11-29 NOTE — Progress Notes (Signed)
I,Yamilka Roman [employer] as a Education administrator for [employer], FNP.,have documented all relevant documentation on the behalf of [name], FNP,as directed by  [name], FNP while in the presence of [name], [place]. This visit occurred during the SARS-CoV-2 public health emergency.  Safety protocols were in place, including screening questions prior to the visit, additional usage of staff PPE, and extensive cleaning of exam room while observing appropriate contact time as indicated for disinfecting solutions.  Subjective:     Patient ID: Christine Mcdowell , female    DOB: 04/05/1971 , 49 y.o.   MRN: [id number]   Chief Complaint  Patient presents with  . Diabetes    HPI  Patient here for dm f/u, she states her blood sugars have been dropping. She is on as needed prednisone when her joints are hurting. She reports she was taking 14 units of humalog daily and her blood sugars have been as low as 40.  She had her lumpectomy on Friday to left breast - reports results were benign.  She is to follow up with [facility] on December 13th at the [facility]. She has not been able to take the pain medication due to being too strong. She has been taking tylenol over the counter.  Continues to have soreness and tenderness.   Diabetes Pertinent negatives for hypoglycemia include no confusion or nervousness/anxiousness. Pertinent negatives for diabetes include no polydipsia, no polyphagia and no polyuria. (She has drank juice to help increase her blood sugars. ) Risk factors for coronary artery disease include sedentary lifestyle and obesity. Current diabetic treatment includes oral agent (dual therapy). She is compliant with treatment all of the time. She is following a generally healthy diet. When asked about meal planning, she reported none. She has not had a previous visit with a dietitian. She rarely participates in exercise. (See above) An ACE inhibitor/angiotensin II receptor blocker  is being taken. She does not see a podiatrist.Eye exam is not current.     Past Medical History:  Diagnosis Date  . AICD (automatic cardioverter/defibrillator) present 01/28/2018  . Anemia   . Anginal pain ([place])   . Asthma   . Cervical cancer ([place])    cervical 1996  . CHF (congestive heart failure) ([place])   . Diabetes mellitus without complication ([place])    steroid induced  . Discoid lupus   . Fibromyalgia   . History of blood transfusion "several"   "related to anemia; had some w/hysterectomy also"  . Hx of cardiovascular stress test    ETT-Myoview (9/15):  No ischemia, EF 52%; NORMAL  . Hx of echocardiogram    Echo (9/15):  EF 50-55%, ant HK, Gr 1 DD, mild MR, mild LAE, no effusion  . Hypertension   . Iron deficiency anemia    h/o iron transfusions  . Lupus (systemic lupus erythematosus) ([place])   . Migraine    "a few/year" (07/03/2016)  . Pneumonia 12/2015  . RA (rheumatoid arthritis) ([place])    "all over" (07/03/2016)  . Sickle cell trait ([place])   . Stroke ([place]) 2014 X 1; 2015 X 2; 2016 X 1;    "right side of face more relaxed than the other; rare speech hesitation" (07/03/2016)  . Vaginal Pap smear, abnormal    ASCUS; HPV     Family History  Problem Relation Age of Onset  . Arthritis Mother   . Heart murmur Mother   . Drug abuse Mother   . Allergies Mother   . Heart  attack Father   . Cushing syndrome Father   . Depression Father   . Allergies Father   . Dementia Paternal Grandmother   . Cancer Paternal Grandfather   . Diabetes Maternal Grandmother   . Hypertension Maternal Grandmother   . Asthma Maternal Grandmother   . Heart attack Maternal Grandfather   . Breast cancer Neg Hx      Current Outpatient Medications:  .  albuterol (ACCUNEB) 1.25 MG/3ML nebulizer solution, Take 3 mLs (1.25 mg total) by nebulization every 6 (six) hours as needed for wheezing., Disp: 75 mL, Rfl: 12 .  aspirin 81 MG chewable tablet, Chew 162 mg by mouth daily. Taking 2 tablets, Disp: ,  Rfl:  .  atorvastatin (LIPITOR) 10 MG tablet, Take 1 tablet (10 mg total) by mouth daily., Disp: 90 tablet, Rfl: 2 .  carvedilol (COREG) 25 MG tablet, TAKE 1 TABLET(25 MG) BY MOUTH TWICE DAILY WITH A MEAL, Disp: 60 tablet, Rfl: 6 .  Continuous Blood Gluc Receiver (DEXCOM G6 RECEIVER) DEVI, 1 Device by Does not apply route 3 (three) times daily before meals., Disp: 1 each, Rfl: 1 .  Continuous Blood Gluc Sensor (DEXCOM G6 SENSOR) MISC, Inject 1 each into the skin 3 (three) times daily., Disp: 3 each, Rfl: 1 .  Continuous Blood Gluc Transmit (DEXCOM G6 TRANSMITTER) MISC, 1 Device by Does not apply route 3 (three) times daily before meals., Disp: 1 each, Rfl: 1 .  ENTRESTO 97-103 MG, TAKE ONE TABLET BY MOUTH EVERY MORNING and TAKE ONE TABLET BY MOUTH EVERY EVENING, Disp: 60 tablet, Rfl: 5 .  folic acid (FOLVITE) 1 MG tablet, TAKE 1 TABLET BY MOUTH EVERY MORNING, Disp: 30 tablet, Rfl: 0 .  hydrALAZINE (APRESOLINE) 25 MG tablet, Take 3 tablets (75 mg total) by mouth 3 (three) times daily., Disp: 270 tablet, Rfl: 11 .  hydroxychloroquine (PLAQUENIL) 200 MG tablet, Take 1 tablet (200 mg total) by mouth 2 (two) times daily., Disp: 180 tablet, Rfl: 0 .  Insulin Pen Needle (NOVOFINE PLUS PEN NEEDLE) 32G X 4 MM MISC, Use with insulin pens dx code e11.65, Disp: 300 each, Rfl: 3 .  Magnesium 400 MG TABS, Take 1 tablet by mouth daily. , Disp: , Rfl:  .  meloxicam (MOBIC) 15 MG tablet, Take 15 mg by mouth daily. , Disp: , Rfl:  .  metFORMIN (GLUCOPHAGE) 500 MG tablet, TAKE ONE TABLET BY MOUTH EVERY MORNING and TAKE ONE TABLET BY MOUTH EVERY EVENING, Disp: 180 tablet, Rfl: 1 .  methotrexate (RHEUMATREX) 2.5 MG tablet, Take 20 mg by mouth every Friday. , Disp: , Rfl: 3 .  metolazone (ZAROXOLYN) 2.5 MG tablet, TAKE 1 TABLET BY MOUTH AS NEEDED FOR WEIGHT GAIN OF 5 POUNDS WITHIN 3 DAYS AS DIRECTED, Disp: 5 tablet, Rfl: 3 .  montelukast (SINGULAIR) 10 MG tablet, TAKE ONE TABLET BY MOUTH EVERY MORNING, Disp: 90 tablet,  Rfl: 1 .  Multiple Vitamin (MULTIVITAMIN WITH MINERALS) TABS, Take 1 tablet by mouth every morning. , Disp: , Rfl:  .  ondansetron (ZOFRAN-ODT) 4 MG disintegrating tablet, Take 1 tablet (4 mg total) by mouth as needed for nausea., Disp: 20 tablet, Rfl: 1 .  potassium chloride SA (KLOR-CON) 20 MEQ tablet, TAKE TWO TABLETS BY MOUTH EVERY MORNING, Disp: 60 tablet, Rfl: 6 .  PROAIR HFA 108 (90 Base) MCG/ACT inhaler, INHALE 2 PUFFS BY MOUTH EVERY DAY AS NEEDED FOR SHORTNESS OF BREATH, Disp: 18 g, Rfl: 2 .  spironolactone (ALDACTONE) 25 MG tablet, TAKE ONE TABLET BY  MOUTH EVERY MORNING and TAKE ONE TABLET BY MOUTH EVERY EVENING, Disp: 180 tablet, Rfl: 1 .  torsemide (DEMADEX) 20 MG tablet, Take 1 tablet Monday, Wednesday , and Friday, Disp: 15 tablet, Rfl: 6 .  Glucagon (GVOKE HYPOPEN 2-PACK) 1 MG/0.2ML SOAJ, Inject 1 mg into the skin as needed., Disp: 0.2 mL, Rfl: 5 .  insulin lispro (HUMALOG) 100 UNIT/ML injection, Inject insulin according to sliding scale 3 times a day before meals., Disp: 10 mL, Rfl: 3 .  metroNIDAZOLE (FLAGYL) 500 MG tablet, Take 1 tablet (500 mg total) by mouth 3 (three) times daily for 10 days., Disp: 30 tablet, Rfl: 0 .  PARoxetine (PAXIL) 10 MG tablet, Take 1 tablet (10 mg total) by mouth daily., Disp: 30 tablet, Rfl: 2 .  Vitamin D, Ergocalciferol, (DRISDOL) 1.25 MG (50000 UNIT) CAPS capsule, TAKE 1 CAPSULE BY MOUTH 2 TIMES EVERY WEEK, Disp: 24 capsule, Rfl: 0   Allergies  Allergen Reactions  . Hydromorphone Nausea And Vomiting    Other reaction(s): GI Upset (intolerance), Hypertension (intolerance) Raises blood pressure  Other reaction(s): GI Upset (intolerance), Hypertension (intolerance) Raises blood pressure to stroke level  . Iodinated Diagnostic Agents Other (See Comments)    Shuts down kidneys Shuts kidney function down  . Other Other (See Comments) and Anaphylaxis    Spicy foods and seasonings Skin Prep "makes my skin peel off" Paper tape causes skin burns  .  Erythromycin Nausea And Vomiting  . Latex Hives  . Tape Other (See Comments)    "Skin burns"  . Mircette [Desogestrel-Ethinyl Estradiol] Nausea And Vomiting and Rash     Review of Systems  Constitutional: Negative.   Eyes: Negative.   Respiratory: Negative.   Cardiovascular: Negative.   Endocrine: Negative for polydipsia, polyphagia and polyuria.  Skin: Negative.   Neurological: Negative.   Psychiatric/Behavioral: Positive for sleep disturbance. Negative for confusion. The patient is not nervous/anxious.        Hot flashes     Today's Vitals   11/29/20 1458  BP: 118/76  Pulse: 82  Temp: 98.7 F (37.1 C)  TempSrc: Oral  Weight: 191 lb (86.6 kg)  Height: 5\' 5"  (1.651 m)   Body mass index is 31.78 kg/m.   Objective:  Physical Exam Constitutional:      General: She is not in acute distress.    Appearance: Normal appearance.  Cardiovascular:     Rate and Rhythm: Normal rate and regular rhythm.     Pulses: Normal pulses.     Heart sounds: No murmur heard.   Pulmonary:     Effort: Pulmonary effort is normal. No respiratory distress.     Breath sounds: Normal breath sounds.  Neurological:     General: No focal deficit present.     Mental Status: She is alert and oriented to person, place, and time.     Cranial Nerves: No cranial nerve deficit.  Psychiatric:        Mood and Affect: Mood normal.        Behavior: Behavior normal.        Thought Content: Thought content normal.        Judgment: Judgment normal.         Assessment And Plan:     1. Type 2 diabetes mellitus with other specified complication, without long-term current use of insulin (HCC)  Chronic, stable  She has been having some low blood sugars will order gvoke and she is to use when blood sugar is under 60  Will check HgbA1c - Glucagon (GVOKE HYPOPEN 2-PACK) 1 MG/0.2ML SOAJ; Inject 1 mg into the skin as needed.  Dispense: 0.2 mL; Refill: 5 - Hemoglobin A1c  2. Hot flashes  This is worsening  will start her on paxil  She is encouraged to avoid sugars and to exercise to help with menopause symptoms  Return in 4-6 weeks for med check - PARoxetine (PAXIL) 10 MG tablet; Take 1 tablet (10 mg total) by mouth daily.  Dispense: 30 tablet; Refill: 2  3. Encounter for screening colonoscopy  According to USPTF Colorectal cancer Screening guidelines. Colonoscopy is recommended every 10 years, starting at age 31years.  Will refer to GI for colon cancer screening. - Ambulatory referral to Gastroenterology  4. Vaginal discharge  Will check for bacterial vaginosis and start medications as necessary - Cervicovaginal ancillary only     Patient was given opportunity to ask questions. Patient verbalized understanding of the plan and was able to repeat key elements of the plan. All questions were answered to their satisfaction.  [name], FNP   I, [name], FNP, have reviewed all documentation for this visit. The documentation on 12/06/20 for the exam, diagnosis, procedures, and orders are all accurate and complete.  THE PATIENT IS ENCOURAGED TO PRACTICE SOCIAL DISTANCING DUE TO THE COVID-19 PANDEMIC.

## 2020-11-30 LAB — HEMOGLOBIN A1C
Est. average glucose Bld gHb Est-mCnc: 140 mg/dL
Hgb A1c MFr Bld: 6.5 % — ABNORMAL HIGH (ref 4.8–5.6)

## 2020-12-01 ENCOUNTER — Telehealth: Payer: Medicare Other

## 2020-12-01 ENCOUNTER — Telehealth: Payer: Self-pay

## 2020-12-01 LAB — CERVICOVAGINAL ANCILLARY ONLY
Bacterial Vaginitis (gardnerella): POSITIVE — AB
Comment: NEGATIVE

## 2020-12-01 NOTE — Chronic Care Management (AMB) (Signed)
Chronic Care Management Pharmacy Assistant   Name: Maeghan Canny  MRN: [id number] DOB: [birth year]  Reason for Encounter: Medication Review  Patient Questions:  1.  Have you seen any other providers since your last visit? Yes, 11/24/2020- Dr Genia Hotter (Surgeon), 11/29/2020- [name], FNP (PCP), 12/05/2020- Daneen Schick (CCM).  2.  Any changes in your medicines or health? Yes, 11/29/2020- Glucagon 1 mg added, Paroxetine 10 mg added, Azelastine 137 mcg, Bacillus Coagulan-Insulin, Diclofenac 1% gel, Insulin Degludec 14 units, Tocilizumab 200mg /43ml, Tramadol 50 mg and Trazodone 50 mg discontinued. 12/05/2020- Metronidazole 500 mg added.  PCP : [name], MD  Allergies:   Allergies  Allergen Reactions  . Hydromorphone Nausea And Vomiting    Other reaction(s): GI Upset (intolerance), Hypertension (intolerance) Raises blood pressure  Other reaction(s): GI Upset (intolerance), Hypertension (intolerance) Raises blood pressure to stroke level  . Iodinated Diagnostic Agents Other (See Comments)    Shuts down kidneys Shuts kidney function down  . Other Other (See Comments) and Anaphylaxis    Spicy foods and seasonings Skin Prep "makes my skin peel off" Paper tape causes skin burns  . Erythromycin Nausea And Vomiting  . Latex Hives  . Tape Other (See Comments)    "Skin burns"  . Mircette [Desogestrel-Ethinyl Estradiol] Nausea And Vomiting and Rash    Medications: Outpatient Encounter Medications as of 12/01/2020  Medication Sig  . albuterol (ACCUNEB) 1.25 MG/3ML nebulizer solution Take 3 mLs (1.25 mg total) by nebulization every 6 (six) hours as needed for wheezing.  Marland Kitchen aspirin 81 MG chewable tablet Chew 162 mg by mouth daily. Taking 2 tablets  . atorvastatin (LIPITOR) 10 MG tablet Take 1 tablet (10 mg total) by mouth daily.  . carvedilol (COREG) 25 MG tablet TAKE 1 TABLET(25 MG) BY MOUTH TWICE DAILY WITH A MEAL  . Continuous Blood Gluc Receiver (DEXCOM G6 RECEIVER) DEVI 1  Device by Does not apply route 3 (three) times daily before meals.  . Continuous Blood Gluc Sensor (DEXCOM G6 SENSOR) MISC Inject 1 each into the skin 3 (three) times daily.  . Continuous Blood Gluc Transmit (DEXCOM G6 TRANSMITTER) MISC 1 Device by Does not apply route 3 (three) times daily before meals.  Marland Kitchen ENTRESTO 97-103 MG TAKE ONE TABLET BY MOUTH EVERY MORNING and TAKE ONE TABLET BY MOUTH EVERY EVENING  . folic acid (FOLVITE) 1 MG tablet TAKE 1 TABLET BY MOUTH EVERY MORNING  . Glucagon (GVOKE HYPOPEN 2-PACK) 1 MG/0.2ML SOAJ Inject 1 mg into the skin as needed.  . hydrALAZINE (APRESOLINE) 25 MG tablet Take 3 tablets (75 mg total) by mouth 3 (three) times daily.  . hydroxychloroquine (PLAQUENIL) 200 MG tablet Take 1 tablet (200 mg total) by mouth 2 (two) times daily.  . insulin lispro (HUMALOG) 100 UNIT/ML injection Inject insulin according to sliding scale 3 times a day before meals.  . Insulin Pen Needle (NOVOFINE PLUS PEN NEEDLE) 32G X 4 MM MISC Use with insulin pens dx code e11.65  . Magnesium 400 MG TABS Take 1 tablet by mouth daily.   . meloxicam (MOBIC) 15 MG tablet Take 15 mg by mouth daily.   . metFORMIN (GLUCOPHAGE) 500 MG tablet TAKE ONE TABLET BY MOUTH EVERY MORNING and TAKE ONE TABLET BY MOUTH EVERY EVENING  . methotrexate (RHEUMATREX) 2.5 MG tablet Take 20 mg by mouth every Friday.   . metolazone (ZAROXOLYN) 2.5 MG tablet TAKE 1 TABLET BY MOUTH AS NEEDED FOR WEIGHT GAIN OF 5 POUNDS WITHIN 3 DAYS AS DIRECTED  . montelukast (  SINGULAIR) 10 MG tablet TAKE ONE TABLET BY MOUTH EVERY MORNING  . Multiple Vitamin (MULTIVITAMIN WITH MINERALS) TABS Take 1 tablet by mouth every morning.   . ondansetron (ZOFRAN-ODT) 4 MG disintegrating tablet Take 1 tablet (4 mg total) by mouth as needed for nausea.  Marland Kitchen PARoxetine (PAXIL) 10 MG tablet Take 1 tablet (10 mg total) by mouth daily.  . potassium chloride SA (KLOR-CON) 20 MEQ tablet TAKE TWO TABLETS BY MOUTH EVERY MORNING  . PROAIR HFA 108 (90 Base)  MCG/ACT inhaler INHALE 2 PUFFS BY MOUTH EVERY DAY AS NEEDED FOR SHORTNESS OF BREATH  . spironolactone (ALDACTONE) 25 MG tablet TAKE ONE TABLET BY MOUTH EVERY MORNING and TAKE ONE TABLET BY MOUTH EVERY EVENING  . torsemide (DEMADEX) 20 MG tablet Take 1 tablet Monday, Wednesday , and Friday  . Vitamin D, Ergocalciferol, (DRISDOL) 1.25 MG (50000 UNIT) CAPS capsule TAKE 1 CAPSULE BY MOUTH 2 TIMES EVERY WEEK   No facility-administered encounter medications on file as of 12/01/2020.    Current Diagnosis: Patient Active Problem List   Diagnosis Date Noted  . 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 History of COVID-19 04/12/2020  . COVID-19 virus infection 03/07/2020  . Lower abdominal pain 09/07/2019  . Chronic systolic (congestive) heart failure ([place]) 01/28/2018  . Cough 01/19/2018  . Pulmonary infiltrates on CXR 01/19/2018  . Migraines 10/08/2017  . Sleep apnea with use of continuous positive airway pressure (CPAP) 09/24/2017  . Congestive heart failure (CHF) ([place]) 10/09/2016  . Rheumatoid arthritis ([place]) 10/09/2016  . Asthma 10/09/2016  . Chronic pain 10/09/2016  . Heart failure ([place]) 10/09/2016  . Congestive heart failure ([place])   . Lupus (systemic lupus erythematosus) ([place])   . Diabetes mellitus with complication ([place])   . Anxiety state   . GERD (gastroesophageal reflux disease) 12/28/2015  . Essential hypertension 12/26/2015  . Type 2 diabetes mellitus ([place]) 12/26/2015  . Chronic systolic heart failure ([place]) 08/18/2014  . Cardiomyopathy, dilated ([place]) 01/27/2014  . Shortness of breath 01/26/2014  . SLE (systemic lupus erythematosus) ([place]) 01/26/2014   Reviewed chart for medication changes ahead of medication coordination call.  OVs, Consults -11/24/2020- Dr Genia Hotter (Surgeon), 11/29/2020- [name], FNP (PCP)  since last care coordination call/Pharmacist visit.   Medication changes indicated: Yes, 11/29/2020- Glucagon 1 mg added, Paroxetine 10 mg  added, Azelastine 137 mcg, Bacillus Coagulan-Insulin, Diclofenac 1% gel, Insulin Degludec 14 units, Tocilizumab 200mg /64ml, Tramadol 50 mg and Trazodone 50 mg discontinued.   BP Readings from Last 3 Encounters:  11/29/20 118/76  08/31/20 118/68  08/30/20 136/76    Lab Results  Component Value Date   HGBA1C 6.5 (H) 11/29/2020     Patient obtains medications through Adherence Packaging  30 Days   Last adherence delivery included:  Vitamin D 50,000 units- one capsules two times a week, Tuesday and Friday (breakfast) Torsemide 20 mg- one tablet Monday, Wednesday and Friday (breakfast) Metformin 500 mg- one tablet twice a day (breakfast and evening meal) Montelukast 10 mg- one tablet daily( breakfast) Hydroxychloroquine 200 mg- one tablet by mouth twice daily (breakfast and evening meal)  Meloxicam 15 mg- One tablet by mouth daily (breakfast)  Trazodone 50 mg- one tablet at bedtime VIAL-EASY OPEN Atorvastatin 10 mg- one tablet daily (bedtime)  Carvedilol 25 mg- One tablet twice a day (breakfast and evening meal) Potassium CL ER 20 Meq- 2 tablets by mouth daily (breakfast and evening meal) Entresto 97 mg/103 mg- one tablet twice a day (breakfast and evening meal) Spironolactone 25 mg-  one tablet twice a day (breakfast and evening meal)  Methotrexate Sodium 2.5 mg- eight tablets by mouth once weekly on  Friday (breakfast) Ondansetron 4 mg- dissolve one tablet under tongue every twelve hours as needed for nausea. VIAL (Dissolvable tablets please) Hydralazine 25 mg- 3 tablets three times daily (breakfast, evening meal and bedtime) Diclofenac 1 % gel- Apply small amount to joints twice daily. Humalog Kwikpen- Inject 17 units sq three times daily with meals  Folic Acid 1 mg- 1 tablet daily (breakfast) One Touch lancets and test strips- to check blood sugars daily as needed. Pen needles- Use with Humalog to inject twice daily  Patient declined the following medications last month: Tresiba  FlexPen due to not being covered by insurance- discontinued Novolog due to using an adequate supply, uses as a sliding scale.  Patient is due for next adherence delivery on: 12/08/2020. 12/01/2020-Called patient and reviewed medications, Patient just had a visit with PCP and looking for medications to be called in, would like to wait until this happens to coordinate delivery. 12/05/2020- Called patient and coordinated delivery.  This delivery to include: Paroxetine 10 mg- 1 tablet daily (breakfast) Humalog Kwikpen- Inject 17 units sq three times daily with meals  Vitamin D 50,000 units- one capsules two times a week, Tuesday and Friday (breakfast) Hydroxychloroquine 200 mg- one tablet by mouth twice daily (breakfast and evening meal)  Meloxicam 15 mg- One tablet by mouth daily (breakfast) Methotrexate Sodium 2.5 mg- eight tablets by mouth once weekly on  Friday (breakfast) Torsemide 20 mg- one tablet Monday, Wednesday and Friday (breakfast) Metformin 500 mg- one tablet twice a day (breakfast and evening meal) Montelukast 10 mg- one tablet daily( breakfast) Hydralazine 25 mg- 3 tablets three times daily (breakfast, evening meal and bedtime) Ondansetron 4 mg- dissolve one tablet under tongue every twelve hours as needed for nausea. VIAL (Dissolvable tablets please) Folic Acid 1 mg- 1 tablet daily (breakfast) Potassium CL ER 20 Meq- 2 tablets by mouth daily (breakfast and evening meal) Entresto 97 mg/103 mg- one tablet twice a day (breakfast and evening meal) Spironolactone 25 mg- one tablet twice a day (breakfast and evening meal) Carvedilol 25 mg- One tablet twice a day (breakfast and evening meal) Atorvastatin 10 mg- one tablet daily (bedtime)  No short fill needed.  Coordinated acute fill for Prednisone 5 mg and Metronidazole 500mg  to be delivered 12/05/2020.  Patient declined the following medications: Tyler Aas FlexPen due to not being covered by insurance- discontinued Novolog due  to using an adequate supply, uses as a sliding scale. Gvoke Hypopen- Inject 1 mg into skin as needed due to receiving a 30 day supply on 11/30/2020 and PRN use.  Diclofenac 1% gel due to being discontinued  Trazodone 50 mg due to being discontinued.   Patient needs refills for Humalog, Ergocalciferol, Methotrexate and Hydrochloroquine .  Confirmed delivery date of 12/08/2020, advised patient that pharmacy will contact them the morning of delivery.    Follow-Up:  Care Coordination with Outside Provider, Coordination of Enhanced Pharmacy Services and Pharmacist Review  Called Dr Rogers Blocker office (Rhematology) spoke with [facility], placed requested for refill of Methotrexate and Hydrochloroquine to be sent to [facility].  Orlando Penner, CPP notified.  Pattricia Boss, [place] Pharmacist Assistant [phone]

## 2020-12-01 NOTE — Progress Notes (Signed)
EPIC Encounter for ICM Monitoring  Patient Name: Christine Mcdowell is a 49 y.o. female Date: 12/01/2020 Primary Care Physican: [name], MD Primary Cardiologist:Bensimhon Electrophysiologist:Taylor LastWeight:184lbs  Transmission reviewed.  12/6/2021[employer] Heart Failure Index0 suggestingnormal fluid levels  Prescribed:  Torsemide20 mg take1tablet (20 mg total)Mon, Wed and Friday.Per Dr Clayborne Dana 08/21/2020 noteCan take extra torsemide or metolazone prn as needed  Potassium 20 mEq take2tabletsdaily.  Spironolactone 25 mg take 1 tablet daily.  Metolazone 2.5 mg takeone tablet as needed for weight gain of 5 lbs within 3 days.   Labs: 08/30/2020 Creatinine 1.02, BUN 16, Potassium 3.7, Sodium 143, GFR 64-74 06/05/2020 Creatinine 1.93, BUN 28, Potassium 3.7, Sodium 139, GFR 29-34 05/01/2020 Creatinine0.81, BUN14, Potassium3.3, Sodium143, GFR>60 04/19/2020 Creatinine0.81, BUN11, Potassium3.2, [id number], GFR>60  01/31/2020 Creatinine0.88, BUN13, Potassium3.4, Sodium141, GFR>60 A complete set of results can be found in Results Review.  Recommendations:No changes.   Follow-up plan: ICM clinic phone appointment on1/09/2021. 91 day device clinic remote transmission3/08/2021.   EP/Cardiology next office visit: Recall for9/10/2022withDr. Lovena Le   Copy of ICM check sent to Dr. Lovena Le.  3 Month Trend    8 Day Data Trend          [name], RN 12/01/2020 2:26 PM

## 2020-12-02 LAB — CUP PACEART REMOTE DEVICE CHECK
Battery Remaining Longevity: 162 mo
Battery Remaining Percentage: 100 %
Brady Statistic RV Percent Paced: 0 %
Date Time Interrogation Session: 20211207112800
HighPow Impedance: 78 Ohm
Implantable Lead Implant Date: 20190206
Implantable Lead Location: 753860
Implantable Lead Model: 292
Implantable Lead Serial Number: 438194
Implantable Pulse Generator Implant Date: 20190206
Lead Channel Impedance Value: 630 Ohm
Lead Channel Setting Pacing Amplitude: 2.5 V
Lead Channel Setting Pacing Pulse Width: 0.4 ms
Lead Channel Setting Sensing Sensitivity: 0.5 mV
Pulse Gen Serial Number: 243572

## 2020-12-04 ENCOUNTER — Encounter: Payer: Self-pay | Admitting: Nurse Practitioner

## 2020-12-04 ENCOUNTER — Telehealth: Payer: Self-pay

## 2020-12-04 DIAGNOSIS — Z881 Allergy status to other antibiotic agents status: Secondary | ICD-10-CM | POA: Diagnosis not present

## 2020-12-04 DIAGNOSIS — Z91040 Latex allergy status: Secondary | ICD-10-CM | POA: Diagnosis not present

## 2020-12-04 DIAGNOSIS — Z885 Allergy status to narcotic agent status: Secondary | ICD-10-CM | POA: Diagnosis not present

## 2020-12-04 DIAGNOSIS — Z886 Allergy status to analgesic agent status: Secondary | ICD-10-CM | POA: Diagnosis not present

## 2020-12-04 DIAGNOSIS — Z48817 Encounter for surgical aftercare following surgery on the skin and subcutaneous tissue: Secondary | ICD-10-CM | POA: Diagnosis not present

## 2020-12-04 DIAGNOSIS — Z91041 Radiographic dye allergy status: Secondary | ICD-10-CM | POA: Diagnosis not present

## 2020-12-04 NOTE — Telephone Encounter (Signed)
[employer] scheduled remote received 11/28/20. Presenting VS 120 bpm. Requested patient send manual to assess presenting. Patient unable to send d/t issue. [employer] phone number given to patient, requested patient call back when she has update. Patient agreeable to plan.     Patient reports she had surgery on 11/24/20 which will cause noise below.

## 2020-12-04 NOTE — Telephone Encounter (Signed)
Presenting rhythm: VS 77 bpm. Advised patient to call with further questions or concerns. Thanked me for the phone call.

## 2020-12-05 ENCOUNTER — Ambulatory Visit: Payer: Medicare Other

## 2020-12-05 ENCOUNTER — Other Ambulatory Visit: Payer: Self-pay | Admitting: Nurse Practitioner

## 2020-12-05 ENCOUNTER — Other Ambulatory Visit: Payer: Self-pay

## 2020-12-05 DIAGNOSIS — B9689 Other specified bacterial agents as the cause of diseases classified elsewhere: Secondary | ICD-10-CM

## 2020-12-05 DIAGNOSIS — I10 Essential (primary) hypertension: Secondary | ICD-10-CM

## 2020-12-05 DIAGNOSIS — I5022 Chronic systolic (congestive) heart failure: Secondary | ICD-10-CM

## 2020-12-05 DIAGNOSIS — E1169 Type 2 diabetes mellitus with other specified complication: Secondary | ICD-10-CM

## 2020-12-05 DIAGNOSIS — N760 Acute vaginitis: Secondary | ICD-10-CM

## 2020-12-05 MED ORDER — VITAMIN D (ERGOCALCIFEROL) 1.25 MG (50000 UNIT) PO CAPS
ORAL_CAPSULE | ORAL | 0 refills | Status: DC
Start: 2020-12-05 — End: 2021-01-05

## 2020-12-05 MED ORDER — METRONIDAZOLE 500 MG PO TABS: 500.0000 mg | ORAL_TABLET | Freq: Three times a day (TID) | ORAL | 0 refills | Status: AC

## 2020-12-05 MED ORDER — INSULIN LISPRO 100 UNIT/ML ~~LOC~~ SOLN: SUBCUTANEOUS | 3 refills | Status: DC

## 2020-12-05 NOTE — Patient Instructions (Signed)
Goals we discussed today:  Goals Addressed              This Visit's Progress     Patient Stated   .  Determine lease status (pt-stated)        Timeframe:  Short-Term Goal Priority:  High Start Date:   12.14.21                          Expected End Date: 1.13.22                       Next date of contact: 12.30.21  Patient Goals/Self-Care Activities Over the next 14 days, patient will:   - Meet with lawyer as scheduled to review lease - Contact SW as needed prior to next scheduled call         .  Work with SW to manage care coordination needs        Timeframe:  Long-Range Goal Priority:  Low Start Date:   12.14.21                          Expected End Date:   4.13.22                    Next date of contact: 12.30.21  Patient Goals/Self-Care Activities Over the next 30 days, patient will:   - Patient will self administer medications as prescribed Patient will attend all scheduled provider appointments Patient will call pharmacy for medication refills Patient will call provider office for new concerns or questions Contact SW as needed prior to next scheduled call

## 2020-12-05 NOTE — Chronic Care Management (AMB) (Signed)
Chronic Care Management    Social Work Follow Up Note  12/05/2020 Name: Aryah Doering MRN: [id number] DOB: 02-Oct-1971  Adanely Reynoso is a 49 y.o. year old female who is a primary care patient of [name], MD. The CCM team was consulted for assistance with care coordination.   Review of patient status, including review of consultants reports, other relevant assessments, and collaboration with appropriate care team members and the patient's provider was performed as part of comprehensive patient evaluation and provision of chronic care management services.    SDOH (Social Determinants of Health) assessments performed: No    Outpatient Encounter Medications as of 12/05/2020  Medication Sig  . albuterol (ACCUNEB) 1.25 MG/3ML nebulizer solution Take 3 mLs (1.25 mg total) by nebulization every 6 (six) hours as needed for wheezing.  Marland Kitchen aspirin 81 MG chewable tablet Chew 162 mg by mouth daily. Taking 2 tablets  . atorvastatin (LIPITOR) 10 MG tablet Take 1 tablet (10 mg total) by mouth daily.  . carvedilol (COREG) 25 MG tablet TAKE 1 TABLET(25 MG) BY MOUTH TWICE DAILY WITH A MEAL  . Continuous Blood Gluc Receiver (DEXCOM G6 RECEIVER) DEVI 1 Device by Does not apply route 3 (three) times daily before meals.  . Continuous Blood Gluc Sensor (DEXCOM G6 SENSOR) MISC Inject 1 each into the skin 3 (three) times daily.  . Continuous Blood Gluc Transmit (DEXCOM G6 TRANSMITTER) MISC 1 Device by Does not apply route 3 (three) times daily before meals.  Marland Kitchen ENTRESTO 97-103 MG TAKE ONE TABLET BY MOUTH EVERY MORNING and TAKE ONE TABLET BY MOUTH EVERY EVENING  . folic acid (FOLVITE) 1 MG tablet TAKE 1 TABLET BY MOUTH EVERY MORNING  . Glucagon (GVOKE HYPOPEN 2-PACK) 1 MG/0.2ML SOAJ Inject 1 mg into the skin as needed.  . hydrALAZINE (APRESOLINE) 25 MG tablet Take 3 tablets (75 mg total) by mouth 3 (three) times daily.  . hydroxychloroquine (PLAQUENIL) 200 MG tablet Take 1 tablet (200 mg total) by  mouth 2 (two) times daily.  . insulin lispro (HUMALOG) 100 UNIT/ML injection Inject insulin according to sliding scale 3 times a day before meals.  . Insulin Pen Needle (NOVOFINE PLUS PEN NEEDLE) 32G X 4 MM MISC Use with insulin pens dx code e11.65  . Magnesium 400 MG TABS Take 1 tablet by mouth daily.   . meloxicam (MOBIC) 15 MG tablet Take 15 mg by mouth daily.   . metFORMIN (GLUCOPHAGE) 500 MG tablet TAKE ONE TABLET BY MOUTH EVERY MORNING and TAKE ONE TABLET BY MOUTH EVERY EVENING  . methotrexate (RHEUMATREX) 2.5 MG tablet Take 20 mg by mouth every Friday.   . metolazone (ZAROXOLYN) 2.5 MG tablet TAKE 1 TABLET BY MOUTH AS NEEDED FOR WEIGHT GAIN OF 5 POUNDS WITHIN 3 DAYS AS DIRECTED  . metroNIDAZOLE (FLAGYL) 500 MG tablet Take 1 tablet (500 mg total) by mouth 3 (three) times daily for 10 days.  . montelukast (SINGULAIR) 10 MG tablet TAKE ONE TABLET BY MOUTH EVERY MORNING  . Multiple Vitamin (MULTIVITAMIN WITH MINERALS) TABS Take 1 tablet by mouth every morning.   . ondansetron (ZOFRAN-ODT) 4 MG disintegrating tablet Take 1 tablet (4 mg total) by mouth as needed for nausea.  Marland Kitchen PARoxetine (PAXIL) 10 MG tablet Take 1 tablet (10 mg total) by mouth daily.  . potassium chloride SA (KLOR-CON) 20 MEQ tablet TAKE TWO TABLETS BY MOUTH EVERY MORNING  . PROAIR HFA 108 (90 Base) MCG/ACT inhaler INHALE 2 PUFFS BY MOUTH EVERY DAY AS NEEDED FOR SHORTNESS OF BREATH  .  spironolactone (ALDACTONE) 25 MG tablet TAKE ONE TABLET BY MOUTH EVERY MORNING and TAKE ONE TABLET BY MOUTH EVERY EVENING  . torsemide (DEMADEX) 20 MG tablet Take 1 tablet Monday, Wednesday , and Friday  . Vitamin D, Ergocalciferol, (DRISDOL) 1.25 MG (50000 UNIT) CAPS capsule TAKE 1 CAPSULE BY MOUTH 2 TIMES EVERY WEEK   No facility-administered encounter medications on file as of 12/05/2020.     Patient Care Plan: Social Work Care Plan    Problem Identified: Care Coordination   Priority: Medium  Onset Date: 12/05/2020    Goal: Identify  housing resources to assist with possible foreclosure   Start Date: 12/05/2020  Expected End Date: 01/04/2021  This Visit's Progress: On track  Priority: High  Note:   Current Barriers:  . Financial constraints related to fixed income - difficulty to save resources due to cost of living . Limited social support . Housing barriers - identified fraud in lease . Chronic conditions including CHF, DM II, and HTN which put patient at increased risk for hospitalization  Social Work Clinical Goal(s):  Marland Kitchen Over the next 20 days, patient will work with her lawyer to address needs related to fraudulent lease and possible foreclosure  Interventions: . Inter-disciplinary care team collaboration (see longitudinal plan of care) . Successful outbound call placed to the patient to assess care coordination needs . Determined the patient recently discovered the person she is under contract with for a "lease to own" is not technically the owner of the property . The patient has been paying monthly rent of $950 to "Celine" whom she believe was the property owner and has a signed lease to own agreement with this individual . Patient reports she was visited by a couple who report they are the true owners of the property who leased the property under a 15 year mortgage to "Celine". Unfortunately- "Celine" has not been paying the monthly mortgage and therefore the property will be foreclosed . Discussed patient does not have any savings due to fixed budget and is unable to afford to move at this time . Determined the patient has contact [employer] for foreclosure assistance- patient was instructed to [employer] to press charges against "Celine" for fraudulent sale of a property . Patient has contact legal aid and was informed they are not taking new clients until January 3rd unless a foreclosure date of sale has been issued . Discussed the patient plans to meet with a lawyer today at 3:00 pm to  review the lease she signed to determine if it is a valid lease or fraudulent . Encouraged the patient to contact SW as needed prior to next outreach scheduled in 14 days  Patient Goals/Self-Care Activities Over the next 14 days, patient will:   -  Meet with lawyer as scheduled to review lease - Contact SW as needed prior to next scheduled call  Follow up Plan: SW will follow up with patient by phone over the next 14 days    Long-Range Goal: Collaborate with RN Care Manager to perform appropriate assessments to assist with care coordination needs   Start Date: 12/05/2020  Expected End Date: 04/04/2021  This Visit's Progress: On track  Priority: Low  Note:   Current Barriers:  . Limited social support . Recent identification of a lump in L breast . Chronic conditions including CHF, DM II, and HTN which put patient at increased risk for hospitalization  Social Work Clinical Goal(s):  Marland Kitchen Over the next 90 days, patient will  work with SW to address concerns related to care coordination  Interventions: . Inter-disciplinary care team collaboration (see longitudinal plan of care) . Successful outbound call placed to the patient to follow up on status of oncology referral . Determined the patient did undergo a lumpectomy - tissue determined to be benign . Discussed the patient attended a post-op appointment within the last 5 days ; incision healing well . Encouraged the patient to contact her primary providers office as needed for ongoing management of chronic health conditions . Collaboration with RN Care Manager to discuss outcome of oncology referral  Patient Goals/Self-Care Activities Over the next 30 days, patient will:   - Patient will self administer medications as prescribed Patient will attend all scheduled provider appointments Patient will call pharmacy for medication refills Patient will call provider office for new concerns or questions Contact SW as needed prior to next  scheduled call  Follow up Plan: SW will follow up with patient by phone over the next 14 days      Daneen Schick, BSW, CDP Social Worker, Certified Dementia Practitioner [facility] [phone]  Total time spent performing care coordination and/or care management activities with the patient by phone or face to face = 20 minutes.

## 2020-12-07 ENCOUNTER — Other Ambulatory Visit: Payer: Self-pay | Admitting: Nurse Practitioner

## 2020-12-07 ENCOUNTER — Encounter: Payer: Self-pay | Admitting: Nurse Practitioner

## 2020-12-07 ENCOUNTER — Ambulatory Visit: Payer: Medicare Other | Admitting: Allergy

## 2020-12-07 DIAGNOSIS — E1169 Type 2 diabetes mellitus with other specified complication: Secondary | ICD-10-CM

## 2020-12-11 DIAGNOSIS — E1165 Type 2 diabetes mellitus with hyperglycemia: Secondary | ICD-10-CM | POA: Diagnosis not present

## 2020-12-11 NOTE — Progress Notes (Signed)
Remote ICD transmission.   

## 2020-12-12 ENCOUNTER — Encounter: Payer: Self-pay | Admitting: Internal Medicine

## 2020-12-12 LAB — HM DIABETES EYE EXAM

## 2020-12-18 DIAGNOSIS — G8929 Other chronic pain: Secondary | ICD-10-CM | POA: Diagnosis not present

## 2020-12-18 DIAGNOSIS — Z79899 Other long term (current) drug therapy: Secondary | ICD-10-CM | POA: Diagnosis not present

## 2020-12-18 DIAGNOSIS — M545 Low back pain, unspecified: Secondary | ICD-10-CM | POA: Diagnosis not present

## 2020-12-18 DIAGNOSIS — M0579 Rheumatoid arthritis with rheumatoid factor of multiple sites without organ or systems involvement: Secondary | ICD-10-CM | POA: Diagnosis not present

## 2020-12-18 DIAGNOSIS — Z7952 Long term (current) use of systemic steroids: Secondary | ICD-10-CM | POA: Diagnosis not present

## 2020-12-19 ENCOUNTER — Other Ambulatory Visit: Payer: Self-pay

## 2020-12-19 MED ORDER — INSULIN LISPRO (1 UNIT DIAL) 100 UNIT/ML (KWIKPEN): PEN_INJECTOR | SUBCUTANEOUS | 11 refills | Status: DC

## 2020-12-21 ENCOUNTER — Ambulatory Visit: Payer: Medicare Other

## 2020-12-21 DIAGNOSIS — I5022 Chronic systolic (congestive) heart failure: Secondary | ICD-10-CM

## 2020-12-21 DIAGNOSIS — E1169 Type 2 diabetes mellitus with other specified complication: Secondary | ICD-10-CM

## 2020-12-21 DIAGNOSIS — I10 Essential (primary) hypertension: Secondary | ICD-10-CM

## 2020-12-21 NOTE — Patient Instructions (Signed)
  Goals we discussed today:  Goals Addressed              This Visit's Progress     Patient Stated   .  Determine lease status (pt-stated)        Timeframe:  Short-Term Goal Priority:  High Start Date:   12.14.21                          Expected End Date: 1.13.22                       Next date of contact: 1.12.22  Patient Goals/Self-Care Activities Over the next 14 days, patient will:   - Radio producer aide on or after 1.3.22 to discuss options for suing [employer] - Contact SW as needed prior to next scheduled call

## 2020-12-21 NOTE — Chronic Care Management (AMB) (Signed)
Chronic Care Management    Social Work Follow Up Note  12/21/2020 Name: Christine Mcdowell MRN: [id number] DOB: [birth year]  Christine Mcdowell is a 49 y.o. year old female who is a primary care patient of Dorothyann Peng, MD. The CCM team was consulted for assistance with care coordination.   Review of patient status, including review of consultants reports, other relevant assessments, and collaboration with appropriate care team members and the patient's provider was performed as part of comprehensive patient evaluation and provision of chronic care management services.    SDOH (Social Determinants of Health) assessments performed: No    Outpatient Encounter Medications as of 12/21/2020  Medication Sig  . albuterol (ACCUNEB) 1.25 MG/3ML nebulizer solution Take 3 mLs (1.25 mg total) by nebulization every 6 (six) hours as needed for wheezing.  Marland Kitchen aspirin 81 MG chewable tablet Chew 162 mg by mouth daily. Taking 2 tablets  . atorvastatin (LIPITOR) 10 MG tablet Take 1 tablet (10 mg total) by mouth daily.  . carvedilol (COREG) 25 MG tablet TAKE 1 TABLET(25 MG) BY MOUTH TWICE DAILY WITH A MEAL  . Continuous Blood Gluc Receiver (DEXCOM G6 RECEIVER) DEVI 1 Device by Does not apply route 3 (three) times daily before meals.  . Continuous Blood Gluc Sensor (DEXCOM G6 SENSOR) MISC Inject 1 each into the skin 3 (three) times daily.  . Continuous Blood Gluc Transmit (DEXCOM G6 TRANSMITTER) MISC 1 Device by Does not apply route 3 (three) times daily before meals.  Marland Kitchen ENTRESTO 97-103 MG TAKE ONE TABLET BY MOUTH EVERY MORNING and TAKE ONE TABLET BY MOUTH EVERY EVENING  . folic acid (FOLVITE) 1 MG tablet TAKE 1 TABLET BY MOUTH EVERY MORNING  . Glucagon (GVOKE HYPOPEN 2-PACK) 1 MG/0.2ML SOAJ Inject 1 mg into the skin as needed.  . hydrALAZINE (APRESOLINE) 25 MG tablet Take 3 tablets (75 mg total) by mouth 3 (three) times daily.  . hydroxychloroquine (PLAQUENIL) 200 MG tablet Take 1 tablet (200 mg total) by  mouth 2 (two) times daily.  . insulin lispro (HUMALOG KWIKPEN) 100 UNIT/ML KwikPen Use according to sliding scale  . Insulin Pen Needle (NOVOFINE PLUS PEN NEEDLE) 32G X 4 MM MISC Use with insulin pens dx code e11.65  . Magnesium 400 MG TABS Take 1 tablet by mouth daily.   . meloxicam (MOBIC) 15 MG tablet Take 15 mg by mouth daily.   . metFORMIN (GLUCOPHAGE) 500 MG tablet TAKE ONE TABLET BY MOUTH EVERY MORNING and TAKE ONE TABLET BY MOUTH EVERY EVENING  . methotrexate (RHEUMATREX) 2.5 MG tablet Take 20 mg by mouth every Friday.   . metolazone (ZAROXOLYN) 2.5 MG tablet TAKE 1 TABLET BY MOUTH AS NEEDED FOR WEIGHT GAIN OF 5 POUNDS WITHIN 3 DAYS AS DIRECTED  . montelukast (SINGULAIR) 10 MG tablet TAKE ONE TABLET BY MOUTH EVERY MORNING  . Multiple Vitamin (MULTIVITAMIN WITH MINERALS) TABS Take 1 tablet by mouth every morning.   . ondansetron (ZOFRAN-ODT) 4 MG disintegrating tablet DISSOLVE ONE tab UNDER THE TONGUE every 12 hours as needed  . PARoxetine (PAXIL) 10 MG tablet Take 1 tablet (10 mg total) by mouth daily.  . potassium chloride SA (KLOR-CON) 20 MEQ tablet TAKE TWO TABLETS BY MOUTH EVERY MORNING  . PROAIR HFA 108 (90 Base) MCG/ACT inhaler INHALE 2 PUFFS BY MOUTH EVERY DAY AS NEEDED FOR SHORTNESS OF BREATH  . spironolactone (ALDACTONE) 25 MG tablet TAKE ONE TABLET BY MOUTH EVERY MORNING and TAKE ONE TABLET BY MOUTH EVERY EVENING  . torsemide (DEMADEX) 20 MG tablet  Take 1 tablet Monday, Wednesday , and Friday  . Vitamin D, Ergocalciferol, (DRISDOL) 1.25 MG (50000 UNIT) CAPS capsule TAKE 1 CAPSULE BY MOUTH 2 TIMES EVERY WEEK   No facility-administered encounter medications on file as of 12/21/2020.     Patient Care Plan: Social Work Care Plan    Problem Identified: Care Coordination   Priority: Medium  Onset Date: 12/05/2020    Goal: Identify housing resources to assist with possible foreclosure   Start Date: 12/05/2020  Expected End Date: 01/04/2021  This Visit's Progress: On track   Recent Progress: On track  Priority: High  Note:   Current Barriers:  . Financial constraints related to fixed income - difficulty to save resources due to cost of living . Limited social support . Housing barriers - identified fraud in lease . Chronic conditions including CHF, DM II, and HTN which put patient at increased risk for hospitalization  Social Work Clinical Goal(s):  Marland Kitchen Over the next 20 days, patient will work with her lawyer to address needs related to fraudulent lease and possible foreclosure  Interventions: Completed 12/21/20 . Successful outbound call placed to the patient to assess goal progression . Determined the patient continues to remain in her current home with the permission of Mr. Lester [place] who is the actual owner of the home . Discussed the patient is now paying rent to Mr. Lester [place] who has dropped the monthly payment from $950 to $500 which has greatly benefited the patient who is on a fixed income . Unfortunately, the patient has yet to hear from [place] who fraudulently signed a lease with the patient . Currently, Mr. Lester [place] has filed a foreclosure on [place] who is on the lease but has yet to make any payments - Once Celine is taken off the lease, Mr. Lester [place] has discussed opportunity to rent and/or sell the property to the patient . Discussed the patient plans to sue Celine as she has paid him $13,300 to date for a fraudulent lease - The patient plans to contact legal aide 1.3.22 to inquire if they will take her case prior to hiring a lawyer for assistance . Patient reports Mr. Lester [place] provided her with new locks which she plans to install today due to Celine having a key to her home and concern for how he may or may not handle the upcoming weeks . Determined the patients long-term goal is to move back to [facility] to be closer to family and to find a safer neighborhood - patient hopes to work with Mr. Lester [place] on this goal as he owns several properties including  some in [place] . Scheduled follow up call over the next month to assess outcome of call with legal aide  Completed 12/05/20 . Inter-disciplinary care team collaboration (see longitudinal plan of care) . Successful outbound call placed to the patient to assess care coordination needs . Determined the patient recently discovered the person she is under contract with for a "lease to own" is not technically the owner of the property . The patient has been paying monthly rent of $950 to "Celine" whom she believe was the property owner and has a signed lease to own agreement with this individual . Patient reports she was visited by a couple who report they are the true owners of the property who leased the property under a 15 year mortgage to "Celine". Unfortunately- "Celine" has not been paying the monthly mortgage and therefore the property will be foreclosed . Discussed patient does not have any  savings due to fixed budget and is unable to afford to move at this time . Determined the patient has contact [employer] for foreclosure assistance- patient was instructed to [employer] to press charges against "Celine" for fraudulent sale of a property . Patient has contact legal aid and was informed they are not taking new clients until January 3rd unless a foreclosure date of sale has been issued . Discussed the patient plans to meet with a lawyer today at 3:00 pm to review the lease she signed to determine if it is a valid lease or fraudulent . Encouraged the patient to contact SW as needed prior to next outreach scheduled in 14 days  Patient Goals/Self-Care Activities Over the next 14 days, patient will:   -  Contact legal aide on or after 1.3.22 to discuss options for suing [employer] - Contact SW as needed prior to next scheduled call  Follow up Plan: SW will follow up with patient by phone over the next 30 days    Long-Range Goal: Collaborate with RN Care Manager to  perform appropriate assessments to assist with care coordination needs   Start Date: 12/05/2020  Expected End Date: 04/04/2021  This Visit's Progress: On track  Priority: Low  Note:   Current Barriers:  . Limited social support . Recent identification of a lump in L breast . Chronic conditions including CHF, DM II, and HTN which put patient at increased risk for hospitalization  Social Work Clinical Goal(s):  Marland Kitchen Over the next 90 days, patient will work with SW to address concerns related to care coordination  Interventions: . Inter-disciplinary care team collaboration (see longitudinal plan of care) . Successful outbound call placed to the patient to follow up on status of oncology referral . Determined the patient did undergo a lumpectomy - tissue determined to be benign . Discussed the patient attended a post-op appointment within the last 5 days ; incision healing well . Encouraged the patient to contact her primary providers office as needed for ongoing management of chronic health conditions . Collaboration with RN Care Manager to discuss outcome of oncology referral  Patient Goals/Self-Care Activities Over the next 30 days, patient will:   - Patient will self administer medications as prescribed Patient will attend all scheduled provider appointments Patient will call pharmacy for medication refills Patient will call provider office for new concerns or questions Contact SW as needed prior to next scheduled call  Follow up Plan: SW will follow up with patient by phone over the next 14 days        Follow Up Plan: SW will follow up with patient by phone over the next month.   Daneen Schick, BSW, CDP Social Worker, Certified Dementia Practitioner [facility] [phone]  Total time spent performing care coordination and/or care management activities with the patient by phone or face to face = 29 minutes.

## 2020-12-27 ENCOUNTER — Telehealth: Payer: Self-pay

## 2020-12-27 ENCOUNTER — Other Ambulatory Visit: Payer: Self-pay

## 2020-12-27 ENCOUNTER — Ambulatory Visit: Payer: Self-pay

## 2020-12-27 ENCOUNTER — Telehealth: Payer: Medicare Other

## 2020-12-27 DIAGNOSIS — I5022 Chronic systolic (congestive) heart failure: Secondary | ICD-10-CM

## 2020-12-27 DIAGNOSIS — M329 Systemic lupus erythematosus, unspecified: Secondary | ICD-10-CM

## 2020-12-27 DIAGNOSIS — J4520 Mild intermittent asthma, uncomplicated: Secondary | ICD-10-CM

## 2020-12-27 DIAGNOSIS — E1169 Type 2 diabetes mellitus with other specified complication: Secondary | ICD-10-CM

## 2020-12-27 DIAGNOSIS — I10 Essential (primary) hypertension: Secondary | ICD-10-CM

## 2020-12-27 DIAGNOSIS — M069 Rheumatoid arthritis, unspecified: Secondary | ICD-10-CM

## 2020-12-27 NOTE — Chronic Care Management (AMB) (Addendum)
Chronic Care Management Pharmacy Assistant   Name: Christine Mcdowell  MRN: [id number] DOB: March 11, 1971  Reason for Encounter: Medication Review  12/27/2020-  prescription called in for fluconazole from [facility] and there is an interaction with that and the hydroxychloroquine that Dr. Rogers Blocker also prescribes.   Called Rheumatology office, spoke with representative who took a message for Dr Shelby Mattocks nurse to call back regarding interactions.  Nurse returned call, she informed me that Dr Rogers Blocker okay'd patient taking fluconazole and aware of the interactions with hydroxychloroquine. [facility] notified.  12/29/2020  Patient Questions:  1.  Have you seen any other providers since your last visit? Yes, 12/04/2020- Manfred Arch, FNP ([facility]), 12/05/2020- Daneen Schick (CCM), 12/18/2020- [facility], MD (Rheumatology), 12/21/2020- Daneen Schick (CCM), 12/27/2020- Barb Merino, RN (CCM).    2.  Any changes in your medicines or health? Yes, 12/04/2020- Novolog 100u/ml and Humalog 100u/ml discontinued (Patient no longer taking), 12/05/2020- Flagyl 500 mg started (Patient to take for 10 days), 12/05/2020- Humalog u/ml renewed, 12/07/2020- Humalog vials changed to Humalog Kwikpen- 17 untis tid. 12/18/2020- Prednisone 10mg  changed to Prednisone 5 mg- 1.5 tablets daily-(Rheumatology- Patient is to decrease Prednisone to 7.5 mg for 30 days and taper down further. 12/22/2020- Fluconazole 100 mg bid added (7 days).   PCP : [name], MD  Allergies:   Allergies  Allergen Reactions   Hydromorphone Nausea And Vomiting    Other reaction(s): GI Upset (intolerance), Hypertension (intolerance) Raises blood pressure  Other reaction(s): GI Upset (intolerance), Hypertension (intolerance) Raises blood pressure to stroke level   Iodinated Diagnostic Agents Other (See Comments)    Shuts down kidneys Shuts kidney function down   Other Other (See Comments) and Anaphylaxis    Spicy  foods and seasonings Skin Prep "makes my skin peel off" Paper tape causes skin burns   Erythromycin Nausea And Vomiting   Latex Hives   Tape Other (See Comments)    "Skin burns"   Mircette [Desogestrel-Ethinyl Estradiol] Nausea And Vomiting and Rash    Medications: Outpatient Encounter Medications as of 12/27/2020  Medication Sig   albuterol (ACCUNEB) 1.25 MG/3ML nebulizer solution Take 3 mLs (1.25 mg total) by nebulization every 6 (six) hours as needed for wheezing.   aspirin 81 MG chewable tablet Chew 162 mg by mouth daily. Taking 2 tablets   atorvastatin (LIPITOR) 10 MG tablet Take 1 tablet (10 mg total) by mouth daily.   carvedilol (COREG) 25 MG tablet TAKE 1 TABLET(25 MG) BY MOUTH TWICE DAILY WITH A MEAL   Continuous Blood Gluc Receiver (DEXCOM G6 RECEIVER) DEVI 1 Device by Does not apply route 3 (three) times daily before meals.   Continuous Blood Gluc Sensor (DEXCOM G6 SENSOR) MISC Inject 1 each into the skin 3 (three) times daily.   Continuous Blood Gluc Transmit (DEXCOM G6 TRANSMITTER) MISC 1 Device by Does not apply route 3 (three) times daily before meals.   ENTRESTO 97-103 MG TAKE ONE TABLET BY MOUTH EVERY MORNING and TAKE ONE TABLET BY MOUTH EVERY EVENING   folic acid (FOLVITE) 1 MG tablet TAKE 1 TABLET BY MOUTH EVERY MORNING   Glucagon (GVOKE HYPOPEN 2-PACK) 1 MG/0.2ML SOAJ Inject 1 mg into the skin as needed.   hydrALAZINE (APRESOLINE) 25 MG tablet Take 3 tablets (75 mg total) by mouth 3 (three) times daily.   hydroxychloroquine (PLAQUENIL) 200 MG tablet Take 1 tablet (200 mg total) by mouth 2 (two) times daily.   insulin lispro (HUMALOG KWIKPEN) 100 UNIT/ML KwikPen Use according  to sliding scale   Insulin Pen Needle (NOVOFINE PLUS PEN NEEDLE) 32G X 4 MM MISC Use with insulin pens dx code e11.65   Magnesium 400 MG TABS Take 1 tablet by mouth daily.    meloxicam (MOBIC) 15 MG tablet Take 15 mg by mouth daily.    metFORMIN (GLUCOPHAGE) 500 MG tablet TAKE ONE TABLET BY MOUTH  EVERY MORNING and TAKE ONE TABLET BY MOUTH EVERY EVENING   methotrexate (RHEUMATREX) 2.5 MG tablet Take 20 mg by mouth every Friday.    metolazone (ZAROXOLYN) 2.5 MG tablet TAKE 1 TABLET BY MOUTH AS NEEDED FOR WEIGHT GAIN OF 5 POUNDS WITHIN 3 DAYS AS DIRECTED   montelukast (SINGULAIR) 10 MG tablet TAKE ONE TABLET BY MOUTH EVERY MORNING   Multiple Vitamin (MULTIVITAMIN WITH MINERALS) TABS Take 1 tablet by mouth every morning.    ondansetron (ZOFRAN-ODT) 4 MG disintegrating tablet DISSOLVE ONE tab UNDER THE TONGUE every 12 hours as needed   PARoxetine (PAXIL) 10 MG tablet Take 1 tablet (10 mg total) by mouth daily.   potassium chloride SA (KLOR-CON) 20 MEQ tablet TAKE TWO TABLETS BY MOUTH EVERY MORNING   PROAIR HFA 108 (90 Base) MCG/ACT inhaler INHALE 2 PUFFS BY MOUTH EVERY DAY AS NEEDED FOR SHORTNESS OF BREATH   spironolactone (ALDACTONE) 25 MG tablet TAKE ONE TABLET BY MOUTH EVERY MORNING and TAKE ONE TABLET BY MOUTH EVERY EVENING   torsemide (DEMADEX) 20 MG tablet Take 1 tablet Monday, Wednesday , and Friday   Vitamin D, Ergocalciferol, (DRISDOL) 1.25 MG (50000 UNIT) CAPS capsule TAKE 1 CAPSULE BY MOUTH 2 TIMES EVERY WEEK   No facility-administered encounter medications on file as of 12/27/2020.    Current Diagnosis: Patient Active Problem List   Diagnosis Date Noted   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   History of COVID-19 04/12/2020   COVID-19 virus infection 03/07/2020   Lower abdominal pain 09/07/2019   Chronic systolic (congestive) heart failure (HCC) 01/28/2018   Cough 01/19/2018   Pulmonary infiltrates on CXR 01/19/2018   Migraines 10/08/2017   Sleep apnea with use of continuous positive airway pressure (CPAP) 09/24/2017   Congestive heart failure (CHF) (HCC) 10/09/2016   Rheumatoid arthritis (HCC) 10/09/2016   Asthma 10/09/2016   Chronic pain 10/09/2016   Heart failure (HCC) 10/09/2016   Congestive heart failure (HCC)    Lupus (systemic lupus  erythematosus) (HCC)    Diabetes mellitus with complication (HCC)    Anxiety state    GERD (gastroesophageal reflux disease) 12/28/2015   Essential hypertension 12/26/2015   Type 2 diabetes mellitus (HCC) 12/26/2015   Chronic systolic heart failure (HCC) 08/18/2014   Cardiomyopathy, dilated (HCC) 01/27/2014   Shortness of breath 01/26/2014   SLE (systemic lupus erythematosus) (HCC) 01/26/2014   Reviewed chart for medication changes ahead of medication coordination call.  OVs and Consults- Yes, 12/04/2020- Ronny Flurry, FNP ([facility]), 12/05/2020- Bevelyn Ngo (CCM), 12/18/2020- Hezzie Bump, MD (Rheumatology), 12/21/2020- Bevelyn Ngo (CCM), 12/27/2020- [name], RN (CCM)  since last care coordination call/Pharmacist visit.   Medication changes indicated: 12/04/2020- Novolog 100u/ml and Humalog 100u/ml discontinued (Patient no longer taking), 12/05/2020- Flagyl 500 mg started (Patient to take for 10 days), 12/05/2020- Humalog u/ml renewed, 12/07/2020- Humalog vials changed to Humalog Kwikpen- 17 untis tid. 12/18/2020- Prednisone 10mg  changed to Prednisone 5 mg- 1.5 tablets daily-(Rheumatology- Patient is to decrease Prednisone to 7.5 mg for 30 days and taper down further. 12/22/2020- Fluconazole 100 mg bid added (7 days).  BP Readings from Last 3 Encounters:  11/29/20 118/76  08/31/20 118/68  08/30/20 136/76    Lab Results  Component Value Date   HGBA1C 6.5 (H) 11/29/2020     Patient obtains medications through Adherence Packaging  30 Days   Last adherence delivery included:  Paroxetine 10 mg- 1 tablet daily (breakfast) Humalog Kwikpen- Inject 17 units sq three times daily with meals  Vitamin D 50,000 units- one capsules two times a week, Tuesday and Friday (breakfast) Hydroxychloroquine 200 mg- one tablet by mouth twice daily (breakfast and evening meal)  Meloxicam 15 mg- One tablet by mouth daily (breakfast) Methotrexate Sodium 2.5 mg- eight tablets by  mouth once weekly on  Friday (breakfast) Torsemide 20 mg- one tablet Monday, Wednesday and Friday (breakfast) Metformin 500 mg- one tablet twice a day (breakfast and evening meal) Montelukast 10 mg- one tablet daily( breakfast) Hydralazine 25 mg- 3 tablets three times daily (breakfast, evening meal and bedtime) Ondansetron 4 mg- dissolve one tablet under tongue every twelve hours as needed for nausea. VIAL (Dissolvable tablets please) Folic Acid 1 mg- 1 tablet daily (breakfast) Potassium CL ER 20 Meq- 2 tablets by mouth daily (breakfast and evening meal) Entresto 97 mg/103 mg- one tablet twice a day (breakfast and evening meal) Spironolactone 25 mg- one tablet twice a day (breakfast and evening meal) Carvedilol 25 mg- One tablet twice a day (breakfast and evening meal) Atorvastatin 10 mg- one tablet daily (bedtime)  Patient declined the following medications last month: Tresiba FlexPen due to not being covered by insurance- discontinued Novolog due to using an adequate supply, uses as a sliding scale. Gvoke Hypopen- Inject 1 mg into skin as needed due to receiving a 30 day supply on 11/30/2020 and PRN use.  Diclofenac 1% gel due to being discontinued  Trazodone 50 mg due to being discontinued.    Patient is due for next adherence delivery on: 01/06/2020. Called patient and reviewed medications and coordinated delivery.  This delivery to include: Paroxetine 10 mg- 1 tablet daily (breakfast) Vitamin D 50,000 units- one capsules two times a week, Tuesday and Friday (breakfast) Hydroxychloroquine 200 mg- one tablet by mouth twice daily (breakfast and evening meal)  Meloxicam 15 mg- One tablet by mouth daily (breakfast) Methotrexate Sodium 2.5 mg- eight tablets by mouth once weekly on  Friday (breakfast) Torsemide 20 mg- one tablet Monday, Wednesday and Friday (breakfast) Metformin 500 mg- one tablet twice a day (breakfast and evening meal) Montelukast 10 mg- one tablet daily(  breakfast) Hydralazine 25 mg- 3 tablets three times daily (breakfast, evening meal and bedtime) Folic Acid 1 mg- 1 tablet daily (breakfast) Potassium CL ER 20 Meq- 2 tablets by mouth daily (breakfast and evening meal) Entresto 97 mg/103 mg- one tablet twice a day (breakfast and evening meal) Spironolactone 25 mg- one tablet twice a day (breakfast and evening meal) Carvedilol 25 mg- One tablet twice a day (breakfast and evening meal) Atorvastatin 10 mg- one tablet daily (bedtime) Vials: Metolazone 2.5 mg- 1 tablet by mouth as needed for weight gain over 5 lbs Ondansetron 4 mg- dissolve one tablet under tongue every twelve hours as needed for nausea. VIAL (Dissolvable tablets please) Trazodone 50 mg- 1 tablet at bedtime as needed for sleep (patient restarted medication) Diclofenac 1% gel- apply small amount to joints twice daily as needed (patient restarted medication) Humalog Kwikpen- Inject 17 units sq three times daily with meals  Gvoke Hypopen- Inject 1 mg into skin as needed- patient would like a 90 day supply. Onetouch Verio test strips- Use as directed once  daily Onetouch Del Mis Plus 33 G- Use as directed once daily.   No short fill or acute fill needed.  Patient declined the following medications:  Tyler Aas FlexPen due to not being covered by insurance- discontinued Novolog due to using an adequate supply, uses as a sliding scale. Tramadol 50 mg- 1 tablet daily due- Patient is no longer taking medication. Dexcom G6 Sensor Device- Patient receives through [employer]. Prednisone 10 mg- Patient has 2 more days left and will start Prednisone 5 mg 1.5 tablet daily for 30 days. Prednisone 5 mg- 1.5 tablet daily due to receiving 20 days supply on 12/19/2020. OTC- Centrum Silver Multivitamin and Vitamin B due to getting from [facility] with discount card. TruePlus Pen Needles 32 G- use as directed, patient has an adequate supply.   Patient needs refills for- No refills  needed.  Confirmed delivery date of 01/05/2021, advised patient that pharmacy will contact them the morning of delivery.   Follow-Up:  Coordination of Enhanced Pharmacy Services and Pharmacist Review  Patient states her blood sugars have been elevated due to being on Prednisone 10 mg and will drop at night to 70's or below, Dexcom reader will not register. Patient has had to use the Gvoke Hyopen to bring blood sugars up twice last week. Patient would like to have a 90 day supply on hand due to fluctuation of blood sugars. Blood sugar readings range from 365-200 in the am and drop to 70 or below in the evenings. Patient states blood pressure are doing really well. Recent reading 117/79. Patient also mentioned that she has had to use Metolazone on Sunday of last week and Monday of this week due to swelling and weight  gain was over 5 lbs. Patient requested refills of medication due to having 2 left.  Patient gets her Dexcom from [place] [facility] and informed me she was contacted yesterday for auto refill of her sensors that she changes out every 7 days. Patient has 2 more sensor left, order was received from [facility] and she is anticipating refill to be sent out soon.  Patient would like to have Folic Acid in her next packaging system in breakfast pack, only if there is no cost. Patient is concerned that insurance is not being processed correctly to where she is getting a charge for Folic Acid 1 mg. Patient would like a prescription sent to [facility] if there is a charge.  Orlando Penner, CPP notifed.  Pattricia Boss, [place] Pharmacist Assistant [phone]  I have reviewed the care management and care coordination activities outlined in this encounter and I am certifying that I agree with the content of this note. No further action required.  3 minutes spent in review, coordination, and documentation.  Mayford Knife, [facility] 01/16/21 8:57 AM

## 2020-12-28 ENCOUNTER — Other Ambulatory Visit: Payer: Self-pay | Admitting: Nurse Practitioner

## 2020-12-28 DIAGNOSIS — Z9109 Other allergy status, other than to drugs and biological substances: Secondary | ICD-10-CM

## 2021-01-01 ENCOUNTER — Ambulatory Visit (INDEPENDENT_AMBULATORY_CARE_PROVIDER_SITE_OTHER): Payer: Medicare Other

## 2021-01-01 ENCOUNTER — Telehealth: Payer: Self-pay

## 2021-01-01 DIAGNOSIS — Z95810 Presence of automatic (implantable) cardiac defibrillator: Secondary | ICD-10-CM

## 2021-01-01 DIAGNOSIS — I5022 Chronic systolic (congestive) heart failure: Secondary | ICD-10-CM | POA: Diagnosis not present

## 2021-01-01 NOTE — Chronic Care Management (AMB) (Signed)
Chronic Care Management   CCM RN Visit Note  12/27/2020 Name: Christine Mcdowell MRN: [id number] DOB: December 15, 1971  Subjective: Christine Mcdowell is a 50 y.o. year old female who is a primary care patient of [name], MD. The CCM team was consulted for assistance with chronic disease management and care coordination needs.    Engaged with patient by telephone for follow up visit in response to provider referral for pharmacy case management and/or care coordination services.   Consent to Services:  The patient was given the following information about Chronic Care Management services today, agreed to services, and gave verbal consent: 1. CCM service includes personalized support from designated clinical staff supervised by the primary care provider, including individualized plan of care and coordination with other care providers 2. 24/7 contact phone numbers for assistance for urgent and routine care needs. 3. Service will only be billed when office clinical staff spend 20 minutes or more in a month to coordinate care. 4. Only one practitioner may furnish and bill the service in a calendar month. 5.The patient may stop CCM services at any time (effective at the end of the month) by phone call to the office staff. 6. The patient will be responsible for cost sharing (co-pay) of up to 20% of the service fee (after annual deductible is met). Patient agreed to services and consent obtained.  Patient agreed to services and verbal consent obtained.   Assessment/Interventions: Review of patient past medical history, allergies, medications, health status, including review of consultants reports, laboratory and other test data, was performed as part of comprehensive evaluation and provision of chronic care management services.   SDOH (Social Determinants of Health) assessments and interventions performed:  No  CCM Care Plan  Allergies  Allergen Reactions  . Hydromorphone Nausea And Vomiting     Other reaction(s): GI Upset (intolerance), Hypertension (intolerance) Raises blood pressure  Other reaction(s): GI Upset (intolerance), Hypertension (intolerance) Raises blood pressure to stroke level  . Iodinated Diagnostic Agents Other (See Comments)    Shuts down kidneys Shuts kidney function down  . Other Other (See Comments) and Anaphylaxis    Spicy foods and seasonings Skin Prep "makes my skin peel off" Paper tape causes skin burns  . Erythromycin Nausea And Vomiting  . Latex Hives  . Tape Other (See Comments)    "Skin burns"  . Mircette [Desogestrel-Ethinyl Estradiol] Nausea And Vomiting and Rash    Outpatient Encounter Medications as of 12/27/2020  Medication Sig  . albuterol (ACCUNEB) 1.25 MG/3ML nebulizer solution Take 3 mLs (1.25 mg total) by nebulization every 6 (six) hours as needed for wheezing.  Marland Kitchen aspirin 81 MG chewable tablet Chew 162 mg by mouth daily. Taking 2 tablets  . atorvastatin (LIPITOR) 10 MG tablet Take 1 tablet (10 mg total) by mouth daily.  . carvedilol (COREG) 25 MG tablet TAKE 1 TABLET(25 MG) BY MOUTH TWICE DAILY WITH A MEAL  . Continuous Blood Gluc Receiver (DEXCOM G6 RECEIVER) DEVI 1 Device by Does not apply route 3 (three) times daily before meals.  . Continuous Blood Gluc Sensor (DEXCOM G6 SENSOR) MISC Inject 1 each into the skin 3 (three) times daily.  . Continuous Blood Gluc Transmit (DEXCOM G6 TRANSMITTER) MISC 1 Device by Does not apply route 3 (three) times daily before meals.  Marland Kitchen ENTRESTO 97-103 MG TAKE ONE TABLET BY MOUTH EVERY MORNING and TAKE ONE TABLET BY MOUTH EVERY EVENING  . folic acid (FOLVITE) 1 MG tablet TAKE 1 TABLET BY MOUTH EVERY MORNING  .  Glucagon (GVOKE HYPOPEN 2-PACK) 1 MG/0.2ML SOAJ Inject 1 mg into the skin as needed.  . hydrALAZINE (APRESOLINE) 25 MG tablet Take 3 tablets (75 mg total) by mouth 3 (three) times daily.  . hydroxychloroquine (PLAQUENIL) 200 MG tablet Take 1 tablet (200 mg total) by mouth 2 (two) times daily.  .  insulin lispro (HUMALOG KWIKPEN) 100 UNIT/ML KwikPen Use according to sliding scale  . Insulin Pen Needle (NOVOFINE PLUS PEN NEEDLE) 32G X 4 MM MISC Use with insulin pens dx code e11.65  . Magnesium 400 MG TABS Take 1 tablet by mouth daily.   . meloxicam (MOBIC) 15 MG tablet Take 15 mg by mouth daily.   . metFORMIN (GLUCOPHAGE) 500 MG tablet TAKE ONE TABLET BY MOUTH EVERY MORNING and TAKE ONE TABLET BY MOUTH EVERY EVENING  . methotrexate (RHEUMATREX) 2.5 MG tablet Take 20 mg by mouth every Friday.   . metolazone (ZAROXOLYN) 2.5 MG tablet TAKE 1 TABLET BY MOUTH AS NEEDED FOR WEIGHT GAIN OF 5 POUNDS WITHIN 3 DAYS AS DIRECTED  . montelukast (SINGULAIR) 10 MG tablet TAKE ONE TABLET BY MOUTH EVERY MORNING  . Multiple Vitamin (MULTIVITAMIN WITH MINERALS) TABS Take 1 tablet by mouth every morning.   . ondansetron (ZOFRAN-ODT) 4 MG disintegrating tablet DISSOLVE ONE tab UNDER THE TONGUE every 12 hours as needed  . PARoxetine (PAXIL) 10 MG tablet Take 1 tablet (10 mg total) by mouth daily.  . potassium chloride SA (KLOR-CON) 20 MEQ tablet TAKE TWO TABLETS BY MOUTH EVERY MORNING  . PROAIR HFA 108 (90 Base) MCG/ACT inhaler INHALE 2 PUFFS BY MOUTH EVERY DAY AS NEEDED FOR SHORTNESS OF BREATH  . spironolactone (ALDACTONE) 25 MG tablet TAKE ONE TABLET BY MOUTH EVERY MORNING and TAKE ONE TABLET BY MOUTH EVERY EVENING  . torsemide (DEMADEX) 20 MG tablet Take 1 tablet Monday, Wednesday , and Friday  . Vitamin D, Ergocalciferol, (DRISDOL) 1.25 MG (50000 UNIT) CAPS capsule TAKE 1 CAPSULE BY MOUTH 2 TIMES EVERY WEEK   No facility-administered encounter medications on file as of 12/27/2020.    Patient Active Problem List   Diagnosis Date Noted  . 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 History of COVID-19 04/12/2020  . COVID-19 virus infection 03/07/2020  . Lower abdominal pain 09/07/2019  . Chronic systolic (congestive) heart failure ([place]) 01/28/2018  . Cough 01/19/2018  . Pulmonary  infiltrates on CXR 01/19/2018  . Migraines 10/08/2017  . Sleep apnea with use of continuous positive airway pressure (CPAP) 09/24/2017  . Congestive heart failure (CHF) ([place]) 10/09/2016  . Rheumatoid arthritis ([place]) 10/09/2016  . Asthma 10/09/2016  . Chronic pain 10/09/2016  . Heart failure ([place]) 10/09/2016  . Congestive heart failure ([place])   . Lupus (systemic lupus erythematosus) ([place])   . Diabetes mellitus with complication ([place])   . Anxiety state   . GERD (gastroesophageal reflux disease) 12/28/2015  . Essential hypertension 12/26/2015  . Type 2 diabetes mellitus ([place]) 12/26/2015  . Chronic systolic heart failure ([place]) 08/18/2014  . Cardiomyopathy, dilated ([place]) 01/27/2014  . Shortness of breath 01/26/2014  . SLE (systemic lupus erythematosus) ([place]) 01/26/2014    Conditions to be addressed/monitored:CHF, HTN, DMII and SLE and RA   Visit Information  Patient Care Plan: Diabetes Type 2 (Adult)    Problem Identified: Glycemic Management (Diabetes, Type 2)   Priority: High    Long-Range Goal: Glycemic Management Optimized   Start Date: 12/27/2020  Expected End Date: 03/27/2021  This Visit's Progress: On track  Priority: High  Note:  Objective:  Lab Results  Component Value Date   HGBA1C 6.5 (H) 11/29/2020 .   Lab Results  Component Value Date   CREATININE 1.02 (H) 08/30/2020   CREATININE 1.93 (H) 06/05/2020   CREATININE 0.81 05/01/2020 .   Marland Kitchen No results found for: EGFR Current Barriers:  Marland Kitchen Knowledge Deficits related to basic Diabetes pathophysiology and self care/management . Knowledge Deficits related to medications used for management of diabetes Case Manager Clinical Goal(s):  Marland Kitchen Over the next 90 days, patient will demonstrate improved adherence to prescribed treatment plan for diabetes self care/management as evidenced by:  . daily monitoring and recording of CBG  . adherence to ADA/ carb modified diet . exercise 3-5 days/week . adherence to prescribed medication  regimen Interventions:  . Provided education to patient about basic DM disease process . Reviewed medications with patient and discussed importance of medication adherence . Advised patient, providing education and rationale, to check cbg before meals and bedtime and record, calling PCP and or CCM team for findings outside established parameters.   . Review of patient status, including review of consultants reports, relevant laboratory and other test results, and medications completed. . Provided patient with sliding scale insulin regimen for hyperglycemic episodes secondary steroid usage   Patient Goals/Self-Care Activities Over the next 90 days, patient will:  - check blood sugar at prescribed times - check blood sugar before and after exercise - check blood sugar if I feel it is too high or too low - enter blood sugar readings and medication or insulin into daily log - take the blood sugar log to all doctor visits - take the blood sugar meter to all doctor visits  - follow sliding scale per PCP recommendations for treatment of hyperglycemia during use of steroids Follow Up Plan: Telephone follow up appointment with care management team member scheduled for: 02/28/21    Patient Care Plan: Hypertension (Adult)    Problem Identified: Manage and track blood pressure    Priority: High    Long-Range Goal: Disease Progression Prevented or Minimized   Start Date: 12/27/2020  Expected End Date: 03/27/2021  This Visit's Progress: On track  Priority: High  Note:   Objective:  . Last practice recorded BP readings:  BP Readings from Last 3 Encounters:  11/29/20 118/76  08/31/20 118/68  08/30/20 136/76 .   Marland Kitchen Most recent eGFR/CrCl: No results found for: EGFR  No components found for: CRCL Current Barriers:  Marland Kitchen Knowledge Deficits related to basic understanding of hypertension pathophysiology and self care management . Knowledge Deficits related to understanding of medications prescribed for  management of hypertension Case Manager Clinical Goal(s):  Marland Kitchen Over the next 90 days, patient will demonstrate improved adherence to prescribed treatment plan for hypertension as evidenced by taking all medications as prescribed, monitoring and recording blood pressure as directed, adhering to low sodium/DASH diet Interventions:  . Evaluation of current treatment plan related to hypertension self management and patient's adherence to plan as established by provider. . Reviewed medications with patient and discussed importance of compliance  Promote a healthy diet that includes primarily plant-based foods, such as fruits, vegetables, whole grains, beans and legumes, low-fat dairy and lean meats.    Consider moderate reduction in sodium intake by avoiding the addition of salt to prepared foods and limiting processed meats, canned soup, frozen meals and salty snacks.   . Discussed plans with patient for ongoing care management follow up and provided patient with direct contact information for care management team . Advised  patient, providing education and rationale, to monitor blood pressure daily and record, calling PCP for findings outside established parameters.   Patient Goals/Self-Care Activities Over the next 90 days, patient will:  - check blood sugar at prescribed times - check blood sugar before and after exercise - check blood sugar if I feel it is too high or too low - enter blood sugar readings and medication or insulin into daily log - take the blood sugar log to all doctor visits - take the blood sugar meter to all doctor visits    Follow Up Plan: Telephone follow up appointment with care management team member scheduled for: 02/28/21    Patient Care Plan: Auto-immune disorder, specifically SLE/RA     Problem Identified: Manage symptoms of auto-immune disorder   Priority: High    Long-Range Goal: Manage symptoms of auto-immune disorder (SLE/RA)   Start Date: 12/27/2020  Expected  End Date: 03/27/2021  This Visit's Progress: On track  Priority: High  Current Barriers:   Ineffective Self Health Maintenance  Currently UNABLE TO independently self manage needs related to chronic health conditions.   Knowledge Deficits related to short term plan for care coordination needs and long term plans for chronic disease management needs Nurse Case Manager Clinical Goal(s):   Over the next 90 days, patient will work with care management team to address care coordination and chronic disease management needs related to Disease Management  Educational Needs  Care Coordination  Medication Management and Education  Psychosocial Support   Interventions:   Determined patient continues to experience a Lupus flare  Determined this condition is being monitored and treated by Dr. Rogers Blocker with last OV completed on 12/18/20 with the following Assessment/Plan noted: PLAN: 1. Continue Plaquenil 200 mg twice daily. Needs at least yearly eye exams. 2. Continue methotrexate 20 mg weekly. Folic acid daily. Continue intensive laboratory monitoring for toxicity every 12 weeks.  3. Continue Rinvoq 15 mg daily. Continue intensive laboratory monitoring for toxicity every 12 weeks.  4. Referral to orthopedic spine doctor.  5. Check labs as noted below.  6. Decrease prednisone to 7.5 mg daily for 30 days and then taper down further.  7. Return to clinic in 4 months.  . Discussed plans with patient for ongoing care management follow up and provided patient with direct contact information for care management team  Follow Up Plan: Telephone follow up appointment with care management team member scheduled for: 02/28/21   Patient Care Plan: Evaluation of chronic sinusitis    Problem Identified: Manage allergies/chronic sinusitis    Priority: Medium    Long-Range Goal: Disease Progression Prevented or Minimized   Start Date: 12/27/2020  Expected End Date: 03/27/2021  This Visit's Progress: On track   Priority: High  Current Barriers:   Ineffective Self Health Maintenance  Currently UNABLE TO independently self manage needs related to chronic health conditions.   Knowledge Deficits related to short term plan for care coordination needs and long term plans for chronic disease management needs Nurse Case Manager Clinical Goal(s):   Over the next 90 days, patient will work with care management team to address care coordination and chronic disease management needs related to Disease Management  Educational Needs  Care Coordination  Medication Management and Education  Psychosocial Support   Interventions:   Determined patient continues to have symptoms suggestive of chronic sinusitis w/o effectiveness from OTC medications prescribed  Determined patient is not currently using a corticosteroid nasal spray  Determined she continues to be prescribed MTX  for symptom management of Lupus/RA  Educated patient on potential SE related to use of MTX including risk for chronic infection   Determined patient will ask her Rheumatologist for recommendations   Collaboration with PCP provider [name] FNP regarding patient's request for ENT/Allergist referral to Dr. Burna Forts with [facility] in [facility] for further evaluation and treatment of chronic sinusitis  Advised patient Nasacort or Flonase is okay to use per recommendations from PCP [name], FNP  Patient Goals/Self Care Activities:  Over the next 90 days, patient will:  - f/u with ENT, Dr. Burna Forts for evaluation and treatment of chronic sinusitis - call PCP for concerns or to report new or worsening symptoms - stay well hydrated - start Nasacort or Flonase for symptom management per recommendations of PCP Follow Up Plan: Telephone follow up appointment with care management team member scheduled for: 02/28/21   Plan:Telephone follow up appointment with care management team member scheduled for:  02/28/21  Barb Merino, RN,  BSN, CCM Care Management Coordinator [facility]  Direct Phone: [phone]

## 2021-01-01 NOTE — Chronic Care Management (AMB) (Signed)
01/01/2021  Called patient to remind her of phone appointment with Orlando Penner, CPP on 01/02/2021 @12 :30. Patient is aware to have all medications and supplements at time of appointment.  Vallie Pearson,CPP notified  Judithann Sheen, [place] Pharmacist Assistant [phone]

## 2021-01-01 NOTE — Patient Instructions (Addendum)
Goals      Patient Stated   .  Manage Chronic Sinusitis (pt-stated)      Timeframe:  Long-Range Goal Priority:  High Start Date: 12/27/20                            Expected End Date: 03/27/21   Next Follow up date: 02/28/21  Over the next 90 days, patient will:  - f/u with ENT, Dr. Burna Forts for evaluation and treatment of chronic sinusitis - call PCP for concerns or to report new or worsening symptoms - stay well hydrated - start Nasacort or Flonase for symptom management per recommendations of PCP                         .  Manage SLE and RA symptoms (pt-stated)      Timeframe:  Long-Range Goal Priority:  High Start Date:  12/27/20                           Expected End Date:  03/27/21   Next Follow up date: 02/28/21  Over the next 90 days, patient will:  - call Rheumatologist with concerns or worsening symptoms related to SLE and or RA  - take all medications exactly as prescribed - keep all scheduled MD appointments                           Other   .  Monitor and Manage My Blood Sugar-Diabetes Type 2      Timeframe:  Long-Range Goal Priority:  High Start Date:                             Expected End Date:                       Follow Up Date 02/28/21   Over the next 90 days, patient will:  - check blood sugar at prescribed times - check blood sugar before and after exercise - check blood sugar if I feel it is too high or too low - enter blood sugar readings and medication or insulin into daily log - take the blood sugar log to all doctor visits - take the blood sugar meter to all doctor visits  - follow sliding scale per PCP recommendations for treatment of hyperglycemia during use of steroids   Why is this important?    Checking your blood sugar at home helps to keep it from getting very high or very low.   Writing the results in a diary or log helps the doctor know how to care for you.   Your blood sugar log should have the time, date and the results.    Also, write down the amount of insulin or other medicine that you take.   Other information, like what you ate, exercise done and how you were feeling, will also be helpful.     Notes:     .  Track and Manage My Blood Pressure-Hypertension      Timeframe:  Long-Range Goal Priority:  High Start Date:                             Expected End Date:  Follow Up Date 02/28/21   - check blood pressure 3 times per week - write blood pressure results in a log or diary    Why is this important?    You won't feel high blood pressure, but it can still hurt your blood vessels.   High blood pressure can cause heart or kidney problems. It can also cause a stroke.   Making lifestyle changes like losing a little weight or eating less salt will help.   Checking your blood pressure at home and at different times of the day can help to control blood pressure.   If the doctor prescribes medicine remember to take it the way the doctor ordered.   Call the office if you cannot afford the medicine or if there are questions about it.     Notes:     The patient verbalized understanding of instructions, educational materials, and care plan provided today and declined offer to receive copy of patient instructions, educational materials, and care plan.   Telephone follow up appointment with care management team member scheduled for: 02/28/21  [name], RN

## 2021-01-02 ENCOUNTER — Ambulatory Visit: Payer: Self-pay

## 2021-01-02 DIAGNOSIS — I10 Essential (primary) hypertension: Secondary | ICD-10-CM

## 2021-01-02 DIAGNOSIS — M329 Systemic lupus erythematosus, unspecified: Secondary | ICD-10-CM

## 2021-01-02 DIAGNOSIS — E1169 Type 2 diabetes mellitus with other specified complication: Secondary | ICD-10-CM

## 2021-01-02 NOTE — Chronic Care Management (AMB) (Signed)
Chronic Care Management Pharmacy  Name: Christine Mcdowell  MRN: [id number] DOB: 02-01-1971  Chief Complaint/ HPI  Christine Mcdowell,  50 y.o. , female presents for their Follow-Up CCM visit with the clinical pharmacist via telephone due to COVID-19 Pandemic. She is a mother of 25, 1 girl and three boys her youngest son is in college. 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 She is very meticulous in her care and tries to stay on top of things. In 2012 she was diagnosed with Lupus and it caused her to have CHF, she reports having a rare blood disorder, 4 ministrokes, breast cancer surgery on 12/3, she has avoided a heart transplant twice, she is a three time cervical cancer survivor. She knows she is a living miracle and tries to make every breath count for something great. She has also had COVID-19. She is in service on Sunday for half the day and she is responsible for the finances. Mondays she does classes for new members for 6-10 weeks. She is not usually a morning purpose due to the Lupus, Fibromyalgia and the Arthritis. She has had knee and foot surgery.  She has a lot of broken sleep patterns where she is up at 10. She is back on prednisone. 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 Then usually on Wednesday she distributes the meals. On Wednesday evenings she does bible study. 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 She has her grand daughter every other weekend and she is 11 years old. She is always getting calls about people who need things. She tries to stay busy doing things positive.   PCP :  [name], MD, [name], FNP   Their chronic conditions include: Hypertension, Chronic Systolic Congestive Heart Failure, Diabetes, Asthma, Allergies, Systemic Lupus Erythematosus  Office Visits:    Consult  Visit: 10/02/2020- CCM pharmacist visit   08/31/2020 Cardiologist OV: Chronic Systolic Heart Failure    Medications: Outpatient Encounter Medications as of 01/02/2021  Medication Sig Note  . albuterol (ACCUNEB) 1.25 MG/3ML nebulizer solution Take 3 mLs (1.25 mg total) by nebulization every 6 (six) hours as needed for wheezing.   Marland Kitchen aspirin 81 MG chewable tablet Chew 162 mg by mouth daily. Taking 2 tablets   . atorvastatin (LIPITOR) 10 MG tablet Take 1 tablet (10 mg total) by mouth daily.   . carvedilol (COREG) 25 MG tablet TAKE 1 TABLET(25 MG) BY MOUTH TWICE DAILY WITH A MEAL   . Continuous Blood Gluc Receiver (DEXCOM G6 RECEIVER) DEVI 1 Device by Does not apply route 3 (three) times daily before meals.   . Continuous Blood Gluc Sensor (DEXCOM G6 SENSOR) MISC Inject 1 each into the skin 3 (three) times daily.   Marland Kitchen ENTRESTO 97-103 MG TAKE ONE TABLET BY MOUTH EVERY MORNING and TAKE ONE TABLET BY MOUTH EVERY EVENING   . folic acid (FOLVITE) 1 MG tablet TAKE 1 TABLET BY MOUTH EVERY MORNING   . Glucagon (GVOKE HYPOPEN 2-PACK) 1 MG/0.2ML SOAJ Inject 1 mg into the skin as needed.   . hydrALAZINE (APRESOLINE) 25 MG tablet Take 3 tablets (75 mg total) by mouth 3 (three) times daily.   . hydroxychloroquine (PLAQUENIL) 200 MG tablet Take 1 tablet (200 mg total) by mouth 2 (two) times daily.   . insulin lispro (HUMALOG KWIKPEN) 100 UNIT/ML  KwikPen Use according to sliding scale   . Insulin Pen Needle (NOVOFINE PLUS PEN NEEDLE) 32G X 4 MM MISC Use with insulin pens dx code e11.65   . Magnesium 400 MG TABS Take 1 tablet by mouth daily.    . meloxicam (MOBIC) 15 MG tablet Take 15 mg by mouth daily.    . metFORMIN (GLUCOPHAGE) 500 MG tablet TAKE ONE TABLET BY MOUTH EVERY MORNING and TAKE ONE TABLET BY MOUTH EVERY EVENING   . methotrexate (RHEUMATREX) 2.5 MG tablet Take 20 mg by mouth every Friday.    . metolazone (ZAROXOLYN) 2.5 MG tablet TAKE 1 TABLET BY MOUTH AS NEEDED FOR WEIGHT GAIN OF 5 POUNDS WITHIN 3  DAYS AS DIRECTED   . montelukast (SINGULAIR) 10 MG tablet TAKE ONE TABLET BY MOUTH EVERY MORNING   . Multiple Vitamin (MULTIVITAMIN WITH MINERALS) TABS Take 1 tablet by mouth every morning.    . ondansetron (ZOFRAN-ODT) 4 MG disintegrating tablet DISSOLVE ONE tab UNDER THE TONGUE every 12 hours as needed   . PARoxetine (PAXIL) 10 MG tablet Take 1 tablet (10 mg total) by mouth daily.   . potassium chloride SA (KLOR-CON) 20 MEQ tablet TAKE TWO TABLETS BY MOUTH EVERY MORNING   . predniSONE (DELTASONE) 5 MG tablet Take 7.5 mg by mouth daily.   Marland Kitchen PROAIR HFA 108 (90 Base) MCG/ACT inhaler INHALE 2 PUFFS BY MOUTH EVERY DAY AS NEEDED FOR SHORTNESS OF BREATH   . spironolactone (ALDACTONE) 25 MG tablet TAKE ONE TABLET BY MOUTH EVERY MORNING and TAKE ONE TABLET BY MOUTH EVERY EVENING   . torsemide (DEMADEX) 20 MG tablet Take 1 tablet Monday, Wednesday , and Friday   . Upadacitinib ER (RINVOQ) 15 MG TB24 Take 15 mg by mouth daily. 01/02/2021: [name] - Rheumatology- Rheumatoid Arthritis and Fibromyalgia   . Vitamin D, Ergocalciferol, (DRISDOL) 1.25 MG (50000 UNIT) CAPS capsule TAKE 1 CAPSULE BY MOUTH 2 TIMES EVERY WEEK   . Continuous Blood Gluc Transmit (DEXCOM G6 TRANSMITTER) MISC 1 Device by Does not apply route 3 (three) times daily before meals.    No facility-administered encounter medications on file as of 01/02/2021.     Current Diagnosis/Assessment:  Goals Addressed            This Visit's Progress   . Pharmacy Care Plan       CARE PLAN ENTRY  Current Barriers:  . Chronic Disease Management support, education, and care coordination needs related to Hypertension, Hyperlipidemia, Diabetes, and Heart Failure   Hypertension . Pharmacist Clinical Goal(s): o Over the next 90 days, patient will work with PharmD and providers to maintain BP goal <130/80 . Current regimen:  o Carvedilol 72m twice daily with a meal o Spironolactone 224mtwice daily o Hydralazine 2564mhree times  daily . Interventions: o Recommended increase in physical activity  o Recommended heart healthy diet o Assisted with coordination of delivery of new, decreased dose of hydralazine . Patient self care activities - Over the next 4 weeks, patient will: o Check BP daily, document, and provide at future appointments o Ensure daily salt intake < 2300 mg/day o Exercise 15 minutes daily 5 times per week  Hyperlipidemia . Pharmacist Clinical Goal(s): o Over the next 90 days, patient will work with PharmD and providers to maintain LDL goal < 100 . Current regimen:  o Atorvastatin 63m104mily . Interventions: o Recommend increase in physical activity . Patient self care activities - Over the next 4 weeks, patient will: o Exercise 15 minutes  daily 5 times per week o Limit fried foods and red meats  Diabetes . Pharmacist Clinical Goal(s): o Over the next 90 days, patient will work with PharmD and providers to maintain A1c goal <7% . Current regimen:  o Metformin 523m with morning and evening meal o Novolog Flexpen up to 17 units three times daily as directed per sliding scale o Tresiba 14 units daily . Interventions: o Continue to use Dexcom system o Send Dexcom readings to Pharmacy team to review readings  o Counseled on s/sx of hyper and hypoglycemia . Patient self care activities - Over the next 4 weeks, patient will: o Continue using Dexcom system  o Contact provider with any episodes of hypoglycemia o Exercise 15 minutes daily 5 times per week  Heart Failure . Pharmacist Clinical Goal(s) o Over the next 965 patient will work with PharmD and providers to prevent weight gain and fluid accumulation . Current regimen:  o Carvedilol 264mtwice daily with a meal o Spironolactone 2544mwice daily o Hydralazine 75m64mree times daily o Torsemide 20mg67mMonday, Wednesday, and Friday o Entresto 97-103mg 9me daily o Metolazone 2.5mg as49meded for weight gain of 5lbs within 3 days as  directed . Interventions o Recommended to assess fluid status daily . Patient self care activities - Over the next 90 days, patient will: o Weigh daily, record, and provide at future appointments  Medication management . Pharmacist Clinical Goal(s): o Over the next 90 days, patient will work with PharmD and providers to maintain optimal medication adherence . Current pharmacy: [facility] . Interventions o Reviewed medications for upcoming synchronization and delivery . Patient self care activities - Over the next 4 weeks, patient will: o Focus on medication adherence by adherence packaging and medication synchronization o Take medications as prescribed o Report any questions or concerns to PharmD and/or provider(s)  Please see past updates related to this goal by clicking on the "Past Updates" button in the selected goal          Diabetes   A1c goal <7%  Recent Relevant Labs: Lab Results  Component Value Date/Time   HGBA1C 6.5 (H) 11/29/2020 04:50 PM   HGBA1C 6.9 (H) 08/30/2020 05:29 PM   GFR 86.64 02/28/2015 10:05 AM   GFR 91.33 02/03/2015 10:33 AM   MICROALBUR 10 08/24/2019 06:06 PM    Last diabetic Eye exam:  Lab Results  Component Value Date/Time   HMDIABEYEEXA No Retinopathy 08/26/2018 12:00 AM    Last diabetic Foot exam: No results found for: HMDIABFOOTEX   Checking BG: Daily - She is currently using Dexcom    At this time she did not report the Dexcom readings -Pt willing to give readings daily or weekly if necessary    Patient has failed these meds in past:  Patient is currently controlled on the following medications: . MetfoMarland Kitchenmin 500 mg - taking 1 tablet by mouth in the morning and the evening  . Humalog Kwikpen- Use according to sliding scale  We discussed :  Patient would be interested Diabetes Refresher Course  Patient is aware of her sliding scale insulin  She has had to use the sliding scale daily.   She has a Dexcom CGM that she  likes because she does not have to worry about pricking her finger  She is currently apart of the [employer] on 12/28/2020  We discussed diet and exercise extensively:  Breakfast - oatmeal, with pecans and vegan sausage  Lunch - varies  she does not eat any fast food, and she will have fried cabbage steaks, or something light   Dinner:   Evening Snack: she has cucumbers, peanut butter crackers and apples   She reports that she had some low blood sugars in the past month   She reports that she had low blood sugars everyday   She has low blood sugars late at night and early in the morning   Patient aware of signs and symptoms of hypoglycemia  We discussed the importance of having the daily evening snack every day Plan  Continue current medications  Collaborate with [facility] CCM team to help patient enroll in Diabetes classes if available.  CPA to reach out to patient  to have Dexcom readings downloaded to computer     Hyperlipidemia   LDL goal < 70  Last lipids Lab Results  Component Value Date   CHOL 196 08/30/2020   HDL 47 08/30/2020   LDLCALC 110 (H) 08/30/2020   TRIG 227 (H) 08/30/2020   CHOLHDL 4.2 08/30/2020   Hepatic Function Latest Ref Rng & Units 08/30/2020 12/02/2019 11/22/2019  Total Protein 6.0 - 8.5 g/dL 6.8 6.5 6.7  Albumin 3.8 - 4.9 g/dL 4.7 4.5 3.8  AST 0 - 40 IU/L _0 ALT 0 - 32 IU/L _1 Alk Phosphatase 48 - 121 IU/L 60 59 43  Total Bilirubin 0.0 - 1.2 mg/dL 0.3 0.3 0.5  Bilirubin, Direct 0.0 - 0.2 mg/dL - - -     The 10-year ASCVD risk score Mikey Bussing DC Jr., et al., 2013) is: 5.8%   Values used to calculate the score:     Age: 56 years     Sex: Female     Is Non-Hispanic African American: Yes     Diabetic: Yes     Tobacco smoker: No     Systolic Blood Pressure: 093 mmHg     Is BP treated: Yes     HDL Cholesterol: 47 mg/dL     Total Cholesterol: 196 mg/dL   Patient has failed these meds in past: none noted  Patient is  currently controlled on the following medications:  . Atorvastatin 10 mg -take daily   Will discuss during next office visit.   Plan  Continue current medications  Collaborate with PCP team and increase patients Atorvastatin 10 mg to 20 mg daily.   Rheumatoid Arthritis / Lupus   Patient has failed these meds in past:  Patient is currently controlled on the following medications:  . Prednisone 5 mg - take 1.5 tablets by mouth daily . Rinviq 15 mg ER - take by mouth daily  We discussed:    Patient reports prednisone is the only thing that works for the inflammation and pain from the arthritis   She stays active and moves around to help with the pain  Current pain is 7/10  [facility]  She reports being in more pain this winter  The infusions were no longer working   Started on Rinvoq 15 mg taking it once a day   [facility] handles her medications  Plan  Continue current medications   Heart Failure   Type: Systolic  Last ejection fraction: LVEF 45-50% on 04/19/20, 40-45% on 02/12/19  Potassium 3.2 on 04/19/20  Patient has failed these meds in past: Isosorbide mononitrate, Bidil Patient is currently controlled on the following medications:   Hydralazine 71m three times daily  Metolazone 2.555mas needed for weight  gain of 5lbs within 3 days as directed  Potassium SA 52mq daily  Entresto 97-1058mtwice daily  Carvedilol 2537mwice daily with a meal  Spironolactone 30m33mice daily  Torsemide 20mg41mMonday, Wednesday, and Friday (morning w/ breakfast)   Will discuss during the follow up visit.   Plan Continue current medications    Hypertension   Office blood pressures are  BP Readings from Last 3 Encounters:  11/29/20 118/76  08/31/20 118/68  08/30/20 136/76   Patient has failed these meds in the past: Valsartan, telmisartan, losartan, lisinopril, lisinopril/HCTZ Patient is currently usually controlled on the  following medications:  -See Heart Failure  Patient checks BP at home daily  Patient home BP readings are ranging:   Plan Continue current medications    Systemic Lupus Erythematosus and Rheumatoid Arthritis   Patient has failed these meds in the past: Actemra infusions   Patient is currently on the following medications:   Hydroxychloroquine 200mg 63me daily  Methotrexate 2.5mg 8 3mlets (20mg) [id number]  Folic acid 1mg dai14m(with breakfast)-only fill if free  Actemra infusions  Meloxicam 15mg dai8ms needed  Diclofenac 1% gel as needed . Prednisone 5 mg - take 1.5 tablets by mouth daily . Rinviq 15 mg ER - take by mouth daily  We discussed:    Patient reports prednisone is the only thing that works for the inflammation and pain from the arthritis   She stays active and moves around to help with the pain  Current pain is 7/10  She reports being used to   [facility]  She reports being in more pain this winter  The infusions were no longer working   Started on Rinvoq 15 mg taking it once a day   [facility]    Plan Continue current medications as prescribed by Rheumatologist   Vitamin D Deficiency   Vitamin D 04/12/20: 43.2ng/mL  Patient is currently controlled on the following medications:   Ergocalciferol 50,000 units twice weekly  Plan Continue current medications   Sleep   Patient is currently controlled on the following medications:  Trazodone 50mg at b47mme  Plan Continue current medications  Allergies   Patient is currently controlled on the following medications:   Azelastine 137mcg 2 sp54m into the nose daily  Ipratropium 0.03% 2 sprays in each nostril three times daily as needed  Plan Continue current medications   Over the Counter Medications   Patient is currently on the following medications:  Probiotic daily  Magnesium 400mg daily 65mtivitamin  daily  Plan Continue current medications   Vaccines   Immunization History  Administered Date(s) Administered  . Influenza, High Dose Seasonal PF 09/02/2018  . Influenza, Seasonal, Injecte, Preservative Fre 11/06/2017  . Influenza,inj,Quad PF,6+ Mos 11/06/2017, 08/24/2019, 08/30/2020  . Influenza-Unspecified 08/08/2017  . PFIZER SARS-COV-2 Vaccination 06/16/2020, 07/07/2020  . Tdap 08/24/2019   Plan  Will review vaccine history and discuss at follow up  Medication Management   Pt uses [facility] for all medications  We discussed:  Pt reported medication delivery has been going well  Comprehensive medication review performed, reviewed CMA monthly dispensing call form prior to delivery of medications  Reviewed administration times of medications with patient for packaging  Pt requested easy open tops for medications that are filled in vials and not packaging  Requested change to potassium 10mEq tablet83m daily) due to pt complaining of large size of 20mEq tablets33man Utilize  [facility] for medication synchronization, packaging and delivery  Notify [facility] of patient's request for easy open packaging   Follow up: 8 weeks phone visit  Orlando Penner, PharmD Clinical Pharmacist [facility] [phone]

## 2021-01-03 ENCOUNTER — Ambulatory Visit: Payer: Medicare Other

## 2021-01-03 DIAGNOSIS — E1169 Type 2 diabetes mellitus with other specified complication: Secondary | ICD-10-CM

## 2021-01-03 DIAGNOSIS — I10 Essential (primary) hypertension: Secondary | ICD-10-CM

## 2021-01-03 DIAGNOSIS — I5022 Chronic systolic (congestive) heart failure: Secondary | ICD-10-CM

## 2021-01-03 NOTE — Progress Notes (Signed)
EPIC Encounter for ICM Monitoring  Patient Name: Christine Mcdowell is a 50 y.o. female Date: 01/03/2021 Primary Care Physican: [name], MD Primary Cardiologist:Bensimhon Electrophysiologist:Taylor LastWeight:184lbs  Transmission reviewed.  1/9/2022[employer] Heart Failure Index1suggestingnormal fluid levels  Prescribed:  Torsemide20 mg take1tablet (20 mg total)Mon, Wed and Friday.Per Dr Clayborne Dana 08/21/2020 noteCan take extra torsemide or metolazone prn as needed  Potassium 20 mEq take2tabletsdaily.  Spironolactone 25 mg take 1 tablet daily.  Metolazone 2.5 mg takeone tablet as needed for weight gain of 5 lbs within 3 days.   Labs: 08/30/2020 Creatinine 1.02, BUN 16, Potassium 3.7, Sodium 143, GFR 64-74 06/05/2020 Creatinine 1.93, BUN 28, Potassium 3.7, Sodium 139, GFR 29-34 05/01/2020 Creatinine0.81, BUN14, Potassium3.3, Sodium143, GFR>60 04/19/2020 Creatinine0.81, BUN11, Potassium3.2, [id number], GFR>60  01/31/2020 Creatinine0.88, BUN13, Potassium3.4, Sodium141, GFR>60 A complete set of results can be found in Results Review.  Recommendations:No changes.   Follow-up plan: ICM clinic phone appointment on2/21/2022. 91 day device clinic remote transmission3/08/2021.   EP/Cardiology next office visit: Recall for9/10/2022withDr. Lovena Le   Copy of ICM check sent to Dr. Lovena Le.  3 Month Trend    8 Day Data Trend          [name], RN 01/03/2021 2:50 PM

## 2021-01-03 NOTE — Chronic Care Management (AMB) (Signed)
Chronic Care Management    Social Work Note  01/03/2021 Name: Christine Mcdowell MRN: [id number] DOB: June 20, 1971  Christine Mcdowell is a 50 y.o. year old female who is a primary care patient of [name], MD. The CCM team was consulted to assist the patient with chronic disease management and/or care coordination needs related to: [employer] .   Engaged with patient by telephone for follow up visit in response to provider referral for social work chronic care management and care coordination services.   Consent to Services:  The patient was given information about Chronic Care Management services, agreed to services, and gave verbal consent prior to initiation of services.  Please see initial visit note for detailed documentation.   Patient agreed to services and consent obtained.   Assessment/Interventions: Review of patient past medical history, allergies, medications, and health status, including review of relevant consultants reports was performed today as part of a comprehensive evaluation and provision of chronic care management and care coordination services.     SDOH (Social Determinants of Health) assessments and interventions performed:  No  Advanced Directives Status: Not addressed in this encounter.  CCM Care Plan  Allergies  Allergen Reactions  . Hydromorphone Nausea And Vomiting    Other reaction(s): GI Upset (intolerance), Hypertension (intolerance) Raises blood pressure  Other reaction(s): GI Upset (intolerance), Hypertension (intolerance) Raises blood pressure to stroke level  . Iodinated Diagnostic Agents Other (See Comments)    Shuts down kidneys Shuts kidney function down  . Other Other (See Comments) and Anaphylaxis    Spicy foods and seasonings Skin Prep "makes my skin peel off" Paper tape causes skin burns  . Erythromycin Nausea And Vomiting  . Latex Hives  . Tape Other (See Comments)    "Skin burns"  . Mircette [Desogestrel-Ethinyl  Estradiol] Nausea And Vomiting and Rash    Outpatient Encounter Medications as of 01/03/2021  Medication Sig Note  . albuterol (ACCUNEB) 1.25 MG/3ML nebulizer solution Take 3 mLs (1.25 mg total) by nebulization every 6 (six) hours as needed for wheezing.   Marland Kitchen aspirin 81 MG chewable tablet Chew 162 mg by mouth daily. Taking 2 tablets   . atorvastatin (LIPITOR) 10 MG tablet Take 1 tablet (10 mg total) by mouth daily.   . carvedilol (COREG) 25 MG tablet TAKE 1 TABLET(25 MG) BY MOUTH TWICE DAILY WITH A MEAL   . Continuous Blood Gluc Receiver (DEXCOM G6 RECEIVER) DEVI 1 Device by Does not apply route 3 (three) times daily before meals.   . Continuous Blood Gluc Sensor (DEXCOM G6 SENSOR) MISC Inject 1 each into the skin 3 (three) times daily.   . Continuous Blood Gluc Transmit (DEXCOM G6 TRANSMITTER) MISC 1 Device by Does not apply route 3 (three) times daily before meals.   Marland Kitchen ENTRESTO 97-103 MG TAKE ONE TABLET BY MOUTH EVERY MORNING and TAKE ONE TABLET BY MOUTH EVERY EVENING   . folic acid (FOLVITE) 1 MG tablet TAKE 1 TABLET BY MOUTH EVERY MORNING   . Glucagon (GVOKE HYPOPEN 2-PACK) 1 MG/0.2ML SOAJ Inject 1 mg into the skin as needed.   . hydrALAZINE (APRESOLINE) 25 MG tablet Take 3 tablets (75 mg total) by mouth 3 (three) times daily.   . hydroxychloroquine (PLAQUENIL) 200 MG tablet Take 1 tablet (200 mg total) by mouth 2 (two) times daily.   . insulin lispro (HUMALOG KWIKPEN) 100 UNIT/ML KwikPen Use according to sliding scale   . Insulin Pen Needle (NOVOFINE PLUS PEN NEEDLE) 32G X 4 MM MISC Use with  insulin pens dx code e11.65   . Magnesium 400 MG TABS Take 1 tablet by mouth daily.    . meloxicam (MOBIC) 15 MG tablet Take 15 mg by mouth daily.    . metFORMIN (GLUCOPHAGE) 500 MG tablet TAKE ONE TABLET BY MOUTH EVERY MORNING and TAKE ONE TABLET BY MOUTH EVERY EVENING   . methotrexate (RHEUMATREX) 2.5 MG tablet Take 20 mg by mouth every Friday.    . metolazone (ZAROXOLYN) 2.5 MG tablet TAKE 1 TABLET  BY MOUTH AS NEEDED FOR WEIGHT GAIN OF 5 POUNDS WITHIN 3 DAYS AS DIRECTED   . montelukast (SINGULAIR) 10 MG tablet TAKE ONE TABLET BY MOUTH EVERY MORNING   . Multiple Vitamin (MULTIVITAMIN WITH MINERALS) TABS Take 1 tablet by mouth every morning.    . ondansetron (ZOFRAN-ODT) 4 MG disintegrating tablet DISSOLVE ONE tab UNDER THE TONGUE every 12 hours as needed   . PARoxetine (PAXIL) 10 MG tablet Take 1 tablet (10 mg total) by mouth daily.   . potassium chloride SA (KLOR-CON) 20 MEQ tablet TAKE TWO TABLETS BY MOUTH EVERY MORNING   . predniSONE (DELTASONE) 5 MG tablet Take 7.5 mg by mouth daily.   Marland Kitchen PROAIR HFA 108 (90 Base) MCG/ACT inhaler INHALE 2 PUFFS BY MOUTH EVERY DAY AS NEEDED FOR SHORTNESS OF BREATH   . spironolactone (ALDACTONE) 25 MG tablet TAKE ONE TABLET BY MOUTH EVERY MORNING and TAKE ONE TABLET BY MOUTH EVERY EVENING   . torsemide (DEMADEX) 20 MG tablet Take 1 tablet Monday, Wednesday , and Friday   . Upadacitinib ER (RINVOQ) 15 MG TB24 Take 15 mg by mouth daily. 01/02/2021: [name] - Rheumatology- Rheumatoid Arthritis and Fibromyalgia   . Vitamin D, Ergocalciferol, (DRISDOL) 1.25 MG (50000 UNIT) CAPS capsule TAKE 1 CAPSULE BY MOUTH 2 TIMES EVERY WEEK    No facility-administered encounter medications on file as of 01/03/2021.    Patient Active Problem List   Diagnosis Date Noted  . 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 History of COVID-19 04/12/2020  . COVID-19 virus infection 03/07/2020  . Lower abdominal pain 09/07/2019  . Chronic systolic (congestive) heart failure ([place]) 01/28/2018  . Cough 01/19/2018  . Pulmonary infiltrates on CXR 01/19/2018  . Migraines 10/08/2017  . Sleep apnea with use of continuous positive airway pressure (CPAP) 09/24/2017  . Congestive heart failure (CHF) ([place]) 10/09/2016  . Rheumatoid arthritis ([place]) 10/09/2016  . Asthma 10/09/2016  . Chronic pain 10/09/2016  . Heart failure ([place]) 10/09/2016  . Congestive heart  failure ([place])   . Lupus (systemic lupus erythematosus) ([place])   . Diabetes mellitus with complication ([place])   . Anxiety state   . GERD (gastroesophageal reflux disease) 12/28/2015  . Essential hypertension 12/26/2015  . Type 2 diabetes mellitus ([place]) 12/26/2015  . Chronic systolic heart failure ([place]) 08/18/2014  . Cardiomyopathy, dilated ([place]) 01/27/2014  . Shortness of breath 01/26/2014  . SLE (systemic lupus erythematosus) ([place]) 01/26/2014    Conditions to be addressed/monitored: CHF, HTN and DMII; Housing barriers and [employer] needs  Care Plan : Social Work Care Plan  Updates made by Daneen Schick since 01/03/2021 12:00 AM    Problem: Care Coordination   Priority: Medium  Onset Date: 12/05/2020    Goal: Identify housing resources to assist with possible foreclosure   Start Date: 12/05/2020  Expected End Date: 01/04/2021  This Visit's Progress: On track  Recent Progress: On track  Priority: High  Note:   Current Barriers:  . Financial constraints related to fixed income -  difficulty to save resources due to cost of living . Limited social support . Housing barriers - identified fraud in lease . Chronic conditions including CHF, DM II, and HTN which put patient at increased risk for hospitalization  Social Work Clinical Goal(s):  Marland Kitchen Over the next 20 days, patient will work with her lawyer to address needs related to fraudulent lease and possible foreclosure  Interventions: . 1:1 collaboration with [name] FNP  regarding development and update of comprehensive plan of care as evidenced by provider attestation and co-signature . Inter-disciplinary care team collaboration (see longitudinal plan of care) . Successful outbound call placed to the patient to assess goal progression . Determined the patient has hired a Chief Executive Officer and will meet with him today at 3:00 pm . Discussed the patient has obtained copies of all cashiers checks and receipts of payments to  fraudulent landlord- patients lawyer plans to sue for what patient is owed on top of moving expenses and Teacher, English as a foreign language . Encouraged the patient to contact SW if resources are needed  Patient Goals/Self-Care Activities Over the next 30 days, patient will:   - Patient will attend all scheduled provider appointments Work with hired attorney regarding fraudulent case - Contact SW as needed prior to next scheduled call  Follow up Plan: SW will follow up with patient by phone over the next 30 days    Long-Range Goal: Collaborate with RN Care Manager to perform appropriate assessments to assist with care coordination needs   Start Date: 12/05/2020  Expected End Date: 04/04/2021  This Visit's Progress: On track  Recent Progress: On track  Priority: Low  Note:   Current Barriers:  . Limited social support . Recent identification of a lump in L breast . Chronic conditions including CHF, DM II, and HTN which put patient at increased risk for hospitalization  Social Work Clinical Goal(s):  Marland Kitchen Over the next 90 days, patient will work with SW to address concerns related to care coordination  Interventions: . 1:1 collaboration with [name], MD regarding development and update of comprehensive plan of care as evidenced by provider attestation and co-signature . Inter-disciplinary care team collaboration (see longitudinal plan of care) . Successful outbound call placed to the patient to assist with care coordination needs . Discussed the patient is behind on her utility bill and currently owes $344 . Determined the patient has attempted to apply for LIEAP through [facility] as that was the patients last known address and due to [place] pandemic DHHS is unable to change status of persons at this time . Patient reports [place] stated she would send patients application to the [facility] referral que and should expect a call from someone over the next few days . Discussed  alternative resources to assist with payment such as Solicitor- patient understands this resource requires a disconnect notice which patient does not have . Scheduled follow up call over the next month to assess outcome of LIEAP application . Encouraged the patient to contact SW as needed prior to next scheduled call  Patient Goals/Self-Care Activities Over the next 30 days, patient will:   - Patient will self administer medications as prescribed Patient will attend all scheduled provider appointments Contact SW as needed prior to next scheduled call Follow up with [place] regarding LIEAP application  Follow up Plan: SW will follow up with patient by phone over the next 30 days       Follow Up Plan: SW will follow up  with patient by phone over the next month.      Daneen Schick, BSW, CDP Social Worker, Certified Dementia Practitioner [place] [facility] [phone]  Total time spent performing care coordination and/or care management activities with the patient by phone or face to face = 22 minutes.

## 2021-01-03 NOTE — Patient Instructions (Signed)
Visit Information  Goals Addressed            This Visit's Progress   . Pharmacy Care Plan       CARE PLAN ENTRY  Current Barriers:  . Chronic Disease Management support, education, and care coordination needs related to Hypertension, Hyperlipidemia, Diabetes, and Heart Failure   Hypertension . Pharmacist Clinical Goal(s): o Over the next 90 days, patient will work with PharmD and providers to maintain BP goal <130/80 . Current regimen:  o Carvedilol 25mg  twice daily with a meal o Spironolactone 25mg  twice daily o Hydralazine 25mg  three times daily . Interventions: o Recommended increase in physical activity  o Recommended heart healthy diet o Assisted with coordination of delivery of new, decreased dose of hydralazine . Patient self care activities - Over the next 4 weeks, patient will: o Check BP daily, document, and provide at future appointments o Ensure daily salt intake < 2300 mg/day o Exercise 15 minutes daily 5 times per week  Hyperlipidemia . Pharmacist Clinical Goal(s): o Over the next 90 days, patient will work with PharmD and providers to maintain LDL goal < 100 . Current regimen:  o Atorvastatin 10mg  daily . Interventions: o Recommend increase in physical activity . Patient self care activities - Over the next 4 weeks, patient will: o Exercise 15 minutes daily 5 times per week o Limit fried foods and red meats  Diabetes . Pharmacist Clinical Goal(s): o Over the next 90 days, patient will work with PharmD and providers to maintain A1c goal <7% . Current regimen:  o Metformin 500mg  with morning and evening meal o Novolog Flexpen up to 17 units three times daily as directed per sliding scale o Tresiba 14 units daily . Interventions: o Continue to use Dexcom system o Send Dexcom readings to Pharmacy team to review readings  o Counseled on s/sx of hyper and hypoglycemia . Patient self care activities - Over the next 4 weeks, patient will: o Continue using  Dexcom system  o Contact provider with any episodes of hypoglycemia o Exercise 15 minutes daily 5 times per week  Heart Failure . Pharmacist Clinical Goal(s) o Over the next 65, patient will work with PharmD and providers to prevent weight gain and fluid accumulation . Current regimen:  o Carvedilol 25mg  twice daily with a meal o Spironolactone 25mg  twice daily o Hydralazine 75mg  three times daily o Torsemide 20mg  on Monday, Wednesday, and Friday o Entresto 97-103mg  twice daily o Metolazone 2.5mg  as needed for weight gain of 5lbs within 3 days as directed . Interventions o Recommended to assess fluid status daily . Patient self care activities - Over the next 90 days, patient will: o Weigh daily, record, and provide at future appointments  Medication management . Pharmacist Clinical Goal(s): o Over the next 90 days, patient will work with PharmD and providers to maintain optimal medication adherence . Current pharmacy: [facility] . Interventions o Reviewed medications for upcoming synchronization and delivery . Patient self care activities - Over the next 4 weeks, patient will: o Focus on medication adherence by adherence packaging and medication synchronization o Take medications as prescribed o Report any questions or concerns to PharmD and/or provider(s)  Please see past updates related to this goal by clicking on the "Past Updates" button in the selected goal         The patient verbalized understanding of instructions, educational materials, and care plan provided today and agreed to receive a mailed copy of patient instructions, educational materials,  and care plan.   The pharmacy team will reach out to the patient again over the next 14 days.   Mayford Knife, [facility]

## 2021-01-03 NOTE — Patient Instructions (Signed)
   Goals we discussed today:  Goals Addressed              This Visit's Progress     Patient Stated   .  Determine lease status (pt-stated)        Timeframe:  Short-Term Goal Priority:  High Start Date:   12.14.21                          Expected End Date: 1.13.22                       Next date of contact: 2.1.22  Patient Goals/Self-Care Activities Over the next 30 days, patient will:   - Patient will attend all scheduled provider appointments Work with hired attorney regarding fraudulent case - Contact SW as needed prior to next scheduled call      Other   .  Work with SW to manage care coordination needs        Timeframe:  Long-Range Goal Priority:  Low Start Date:   12.14.21                          Expected End Date:   4.13.22                    Next date of contact: 2.1.22  Patient Goals/Self-Care Activities Over the next 30 days, patient will:   - Patient will self administer medications as prescribed Patient will attend all scheduled provider appointments Contact SW as needed prior to next scheduled call Follow up with [place] regarding LIEAP application

## 2021-01-05 ENCOUNTER — Other Ambulatory Visit: Payer: Self-pay | Admitting: Nurse Practitioner

## 2021-01-10 ENCOUNTER — Telehealth: Payer: Self-pay

## 2021-01-10 NOTE — Chronic Care Management (AMB) (Signed)
Chronic Care Management Pharmacy Assistant   Name: Christine Mcdowell  MRN: [id number] DOB: 05/17/1971  Reason for Encounter: Disease State Assistance  PCP : [name], MD   01/10/2021- Called patient to get full email address to send invite for Dexcom uploader so she can send her blood sugar readings online to PCP office. Spoke with patient, sent invite to [email or web address]. Patient states she will try to accept invite today or tomorrow morning and will call to let me know.   Allergies:   Allergies  Allergen Reactions   Hydromorphone Nausea And Vomiting    Other reaction(s): GI Upset (intolerance), Hypertension (intolerance) Raises blood pressure  Other reaction(s): GI Upset (intolerance), Hypertension (intolerance) Raises blood pressure to stroke level   Iodinated Diagnostic Agents Other (See Comments)    Shuts down kidneys Shuts kidney function down   Other Other (See Comments) and Anaphylaxis    Spicy foods and seasonings Skin Prep "makes my skin peel off" Paper tape causes skin burns   Erythromycin Nausea And Vomiting   Latex Hives   Tape Other (See Comments)    "Skin burns"   Mircette [Desogestrel-Ethinyl Estradiol] Nausea And Vomiting and Rash    Medications: Outpatient Encounter Medications as of 01/10/2021  Medication Sig Note   albuterol (ACCUNEB) 1.25 MG/3ML nebulizer solution Take 3 mLs (1.25 mg total) by nebulization every 6 (six) hours as needed for wheezing.    aspirin 81 MG chewable tablet Chew 162 mg by mouth daily. Taking 2 tablets    atorvastatin (LIPITOR) 10 MG tablet Take 1 tablet (10 mg total) by mouth daily.    carvedilol (COREG) 25 MG tablet TAKE 1 TABLET(25 MG) BY MOUTH TWICE DAILY WITH A MEAL    Continuous Blood Gluc Receiver (DEXCOM G6 RECEIVER) DEVI 1 Device by Does not apply route 3 (three) times daily before meals.    Continuous Blood Gluc Sensor (DEXCOM G6 SENSOR) MISC Inject 1 each into the skin 3 (three) times  daily.    Continuous Blood Gluc Transmit (DEXCOM G6 TRANSMITTER) MISC 1 Device by Does not apply route 3 (three) times daily before meals.    ENTRESTO 97-103 MG TAKE ONE TABLET BY MOUTH EVERY MORNING and TAKE ONE TABLET BY MOUTH EVERY EVENING    folic acid (FOLVITE) 1 MG tablet TAKE 1 TABLET BY MOUTH EVERY MORNING    Glucagon (GVOKE HYPOPEN 2-PACK) 1 MG/0.2ML SOAJ Inject 1 mg into the skin as needed.    hydrALAZINE (APRESOLINE) 25 MG tablet Take 3 tablets (75 mg total) by mouth 3 (three) times daily.    hydroxychloroquine (PLAQUENIL) 200 MG tablet Take 1 tablet (200 mg total) by mouth 2 (two) times daily.    insulin lispro (HUMALOG KWIKPEN) 100 UNIT/ML KwikPen Use according to sliding scale    Insulin Pen Needle (NOVOFINE PLUS PEN NEEDLE) 32G X 4 MM MISC Use with insulin pens dx code e11.65    Magnesium 400 MG TABS Take 1 tablet by mouth daily.     meloxicam (MOBIC) 15 MG tablet Take 15 mg by mouth daily.     metFORMIN (GLUCOPHAGE) 500 MG tablet TAKE ONE TABLET BY MOUTH EVERY MORNING and TAKE ONE TABLET BY MOUTH EVERY EVENING    methotrexate (RHEUMATREX) 2.5 MG tablet Take 20 mg by mouth every Friday.     metolazone (ZAROXOLYN) 2.5 MG tablet TAKE 1 TABLET BY MOUTH AS NEEDED FOR WEIGHT GAIN OF 5 POUNDS WITHIN 3 DAYS AS DIRECTED    montelukast (SINGULAIR) 10 MG tablet TAKE ONE TABLET  BY MOUTH EVERY MORNING    Multiple Vitamin (MULTIVITAMIN WITH MINERALS) TABS Take 1 tablet by mouth every morning.     ondansetron (ZOFRAN-ODT) 4 MG disintegrating tablet DISSOLVE ONE tab UNDER THE TONGUE every 12 hours as needed    PARoxetine (PAXIL) 10 MG tablet Take 1 tablet (10 mg total) by mouth daily.    potassium chloride SA (KLOR-CON) 20 MEQ tablet TAKE TWO TABLETS BY MOUTH EVERY MORNING    predniSONE (DELTASONE) 5 MG tablet Take 7.5 mg by mouth daily.    PROAIR HFA 108 (90 Base) MCG/ACT inhaler INHALE 2 PUFFS BY MOUTH EVERY DAY AS NEEDED FOR SHORTNESS OF BREATH    spironolactone  (ALDACTONE) 25 MG tablet TAKE ONE TABLET BY MOUTH EVERY MORNING and TAKE ONE TABLET BY MOUTH EVERY EVENING    torsemide (DEMADEX) 20 MG tablet Take 1 tablet Monday, Wednesday , and Friday    Upadacitinib ER (RINVOQ) 15 MG TB24 Take 15 mg by mouth daily. 01/02/2021: Christine Mcdowell - Rheumatology- Rheumatoid Arthritis and Fibromyalgia    Vitamin D, Ergocalciferol, (DRISDOL) 1.25 MG (50000 UNIT) CAPS capsule TAKE ONE CAPSULE BY MOUTH ONCE WEEKLY ON TUESDAY and TAKE ONE CAPSULE BY MOUTH ONCE WEEKLY ON FRIDAY    No facility-administered encounter medications on file as of 01/10/2021.    Current Diagnosis: Patient Active Problem List   Diagnosis Date Noted   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   History of COVID-19 04/12/2020   COVID-19 virus infection 03/07/2020   Lower abdominal pain 22/01/5426   Chronic systolic (congestive) heart failure ([place]) 01/28/2018   Cough 01/19/2018   Pulmonary infiltrates on CXR 01/19/2018   Migraines 10/08/2017   Sleep apnea with use of continuous positive airway pressure (CPAP) 09/24/2017   Congestive heart failure (CHF) ([place]) 10/09/2016   Rheumatoid arthritis ([place]) 10/09/2016   Asthma 10/09/2016   Chronic pain 10/09/2016   Heart failure ([place]) 10/09/2016   Congestive heart failure ([place])    Lupus (systemic lupus erythematosus) ([place])    Diabetes mellitus with complication ([facility])    Anxiety state    GERD (gastroesophageal reflux disease) 12/28/2015   Essential hypertension 12/26/2015   Type 2 diabetes mellitus ([place]) 06/15/7627   Chronic systolic heart failure ([place]) 08/18/2014   Cardiomyopathy, dilated ([place]) 01/27/2014   Shortness of breath 01/26/2014   SLE (systemic lupus erythematosus) ([place]) 01/26/2014     Follow-Up:  Pharmacist Review  01/20/2021- Checked Dexcom Uploader, patient has not accepted invitation yet. Orlando Penner, CPP notified.   Pattricia Boss, [place] Pharmacist  Assistant [phone]

## 2021-01-11 DIAGNOSIS — E1165 Type 2 diabetes mellitus with hyperglycemia: Secondary | ICD-10-CM | POA: Diagnosis not present

## 2021-01-12 DIAGNOSIS — Z1159 Encounter for screening for other viral diseases: Secondary | ICD-10-CM | POA: Diagnosis not present

## 2021-01-12 DIAGNOSIS — I5032 Chronic diastolic (congestive) heart failure: Secondary | ICD-10-CM | POA: Diagnosis not present

## 2021-01-14 DIAGNOSIS — R051 Acute cough: Secondary | ICD-10-CM | POA: Diagnosis not present

## 2021-01-14 DIAGNOSIS — I517 Cardiomegaly: Secondary | ICD-10-CM | POA: Diagnosis not present

## 2021-01-14 DIAGNOSIS — N390 Urinary tract infection, site not specified: Secondary | ICD-10-CM | POA: Diagnosis not present

## 2021-01-18 DIAGNOSIS — E119 Type 2 diabetes mellitus without complications: Secondary | ICD-10-CM | POA: Diagnosis not present

## 2021-01-18 DIAGNOSIS — U071 COVID-19: Secondary | ICD-10-CM | POA: Diagnosis not present

## 2021-01-18 DIAGNOSIS — I509 Heart failure, unspecified: Secondary | ICD-10-CM | POA: Diagnosis not present

## 2021-01-18 DIAGNOSIS — Z1159 Encounter for screening for other viral diseases: Secondary | ICD-10-CM | POA: Diagnosis not present

## 2021-01-22 DIAGNOSIS — E119 Type 2 diabetes mellitus without complications: Secondary | ICD-10-CM | POA: Diagnosis not present

## 2021-01-22 DIAGNOSIS — Z794 Long term (current) use of insulin: Secondary | ICD-10-CM | POA: Diagnosis not present

## 2021-01-23 ENCOUNTER — Other Ambulatory Visit: Payer: Self-pay | Admitting: Nurse Practitioner

## 2021-01-23 ENCOUNTER — Ambulatory Visit: Payer: Medicare Other

## 2021-01-23 ENCOUNTER — Other Ambulatory Visit (HOSPITAL_COMMUNITY): Payer: Self-pay | Admitting: Internal Medicine

## 2021-01-23 ENCOUNTER — Telehealth: Payer: Self-pay

## 2021-01-23 DIAGNOSIS — E1169 Type 2 diabetes mellitus with other specified complication: Secondary | ICD-10-CM

## 2021-01-23 DIAGNOSIS — I10 Essential (primary) hypertension: Secondary | ICD-10-CM

## 2021-01-23 DIAGNOSIS — I5022 Chronic systolic (congestive) heart failure: Secondary | ICD-10-CM

## 2021-01-23 DIAGNOSIS — R232 Flushing: Secondary | ICD-10-CM

## 2021-01-23 MED ORDER — GVOKE HYPOPEN 2-PACK 1 MG/0.2ML ~~LOC~~ SOAJ: 1.0000 mg | SUBCUTANEOUS | 5 refills | Status: DC | PRN

## 2021-01-23 NOTE — Chronic Care Management (AMB) (Signed)
Chronic Care Management    Social Work Note  01/23/2021 Name: Christine Mcdowell MRN: [id number] DOB: 1971/01/24  Christine Mcdowell is a 50 y.o. year old female who is a primary care patient of [name], MD. The CCM team was consulted to assist the patient with chronic disease management and/or care coordination needs related to: care coordination.   Engaged with patient by telephone for follow up visit in response to provider referral for social work chronic care management and care coordination services.   Consent to Services:  The patient was given information about Chronic Care Management services, agreed to services, and gave verbal consent prior to initiation of services.  Please see initial visit note for detailed documentation.   Patient agreed to services and consent obtained.   Assessment: Review of patient past medical history, allergies, medications, and health status, including review of relevant consultants reports was performed today as part of a comprehensive evaluation and provision of chronic care management and care coordination services.     SDOH (Social Determinants of Health) assessments and interventions performed:    Advanced Directives Status: Not addressed in this encounter.  CCM Care Plan  Allergies  Allergen Reactions  . Hydromorphone Nausea And Vomiting    Other reaction(s): GI Upset (intolerance), Hypertension (intolerance) Raises blood pressure  Other reaction(s): GI Upset (intolerance), Hypertension (intolerance) Raises blood pressure to stroke level  . Iodinated Diagnostic Agents Other (See Comments)    Shuts down kidneys Shuts kidney function down  . Other Other (See Comments) and Anaphylaxis    Spicy foods and seasonings Skin Prep "makes my skin peel off" Paper tape causes skin burns  . Erythromycin Nausea And Vomiting  . Latex Hives  . Tape Other (See Comments)    "Skin burns"  . Mircette [Desogestrel-Ethinyl Estradiol] Nausea And  Vomiting and Rash    Outpatient Encounter Medications as of 01/23/2021  Medication Sig Note  . albuterol (ACCUNEB) 1.25 MG/3ML nebulizer solution Take 3 mLs (1.25 mg total) by nebulization every 6 (six) hours as needed for wheezing.   Marland Kitchen aspirin 81 MG chewable tablet Chew 162 mg by mouth daily. Taking 2 tablets   . atorvastatin (LIPITOR) 10 MG tablet Take 1 tablet (10 mg total) by mouth daily.   . carvedilol (COREG) 25 MG tablet TAKE ONE TABLET BY MOUTH EVERY MORNING and TAKE ONE TABLET BY MOUTH EVERY EVENING   . Continuous Blood Gluc Receiver (DEXCOM G6 RECEIVER) DEVI 1 Device by Does not apply route 3 (three) times daily before meals.   . Continuous Blood Gluc Sensor (DEXCOM G6 SENSOR) MISC Inject 1 each into the skin 3 (three) times daily.   . Continuous Blood Gluc Transmit (DEXCOM G6 TRANSMITTER) MISC 1 Device by Does not apply route 3 (three) times daily before meals.   Marland Kitchen ENTRESTO 97-103 MG TAKE ONE TABLET BY MOUTH EVERY MORNING and TAKE ONE TABLET BY MOUTH EVERY EVENING   . folic acid (FOLVITE) 1 MG tablet TAKE 1 TABLET BY MOUTH EVERY MORNING   . Glucagon (GVOKE HYPOPEN 2-PACK) 1 MG/0.2ML SOAJ Inject 1 mg into the skin as needed.   . hydrALAZINE (APRESOLINE) 25 MG tablet Take 3 tablets (75 mg total) by mouth 3 (three) times daily.   . hydroxychloroquine (PLAQUENIL) 200 MG tablet Take 1 tablet (200 mg total) by mouth 2 (two) times daily.   . insulin lispro (HUMALOG KWIKPEN) 100 UNIT/ML KwikPen Use according to sliding scale   . Insulin Pen Needle (NOVOFINE PLUS PEN NEEDLE) 32G X 4 MM  MISC Use with insulin pens dx code e11.65   . Magnesium 400 MG TABS Take 1 tablet by mouth daily.    . meloxicam (MOBIC) 15 MG tablet Take 15 mg by mouth daily.    . metFORMIN (GLUCOPHAGE) 500 MG tablet TAKE ONE TABLET BY MOUTH EVERY MORNING and TAKE ONE TABLET BY MOUTH EVERY EVENING   . methotrexate (RHEUMATREX) 2.5 MG tablet Take 20 mg by mouth every Friday.    . metolazone (ZAROXOLYN) 2.5 MG tablet TAKE 1  TABLET BY MOUTH AS NEEDED FOR WEIGHT GAIN OF 5 POUNDS WITHIN 3 DAYS AS DIRECTED   . montelukast (SINGULAIR) 10 MG tablet TAKE ONE TABLET BY MOUTH EVERY MORNING   . Multiple Vitamin (MULTIVITAMIN WITH MINERALS) TABS Take 1 tablet by mouth every morning.    . ondansetron (ZOFRAN-ODT) 4 MG disintegrating tablet DISSOLVE ONE tab UNDER THE TONGUE every 12 hours as needed   . PARoxetine (PAXIL) 10 MG tablet TAKE ONE TABLET BY MOUTH EVERY MORNING   . potassium chloride SA (KLOR-CON) 20 MEQ tablet TAKE TWO TABLETS BY MOUTH EVERY MORNING   . predniSONE (DELTASONE) 5 MG tablet Take 7.5 mg by mouth daily.   Marland Kitchen PROAIR HFA 108 (90 Base) MCG/ACT inhaler INHALE 2 PUFFS BY MOUTH EVERY DAY AS NEEDED FOR SHORTNESS OF BREATH   . spironolactone (ALDACTONE) 25 MG tablet TAKE ONE TABLET BY MOUTH EVERY MORNING and TAKE ONE TABLET BY MOUTH EVERY EVENING   . torsemide (DEMADEX) 20 MG tablet Take 1 tablet Monday, Wednesday , and Friday   . Upadacitinib ER (RINVOQ) 15 MG TB24 Take 15 mg by mouth daily. 01/02/2021: [name] - Rheumatology- Rheumatoid Arthritis and Fibromyalgia   . Vitamin D, Ergocalciferol, (DRISDOL) 1.25 MG (50000 UNIT) CAPS capsule TAKE ONE CAPSULE BY MOUTH ONCE WEEKLY ON TUESDAY and TAKE ONE CAPSULE BY MOUTH ONCE WEEKLY ON FRIDAY    No facility-administered encounter medications on file as of 01/23/2021.    Patient Active Problem List   Diagnosis Date Noted  . 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 History of COVID-19 04/12/2020  . COVID-19 virus infection 03/07/2020  . Lower abdominal pain 09/07/2019  . Chronic systolic (congestive) heart failure ([place]) 01/28/2018  . Cough 01/19/2018  . Pulmonary infiltrates on CXR 01/19/2018  . Migraines 10/08/2017  . Sleep apnea with use of continuous positive airway pressure (CPAP) 09/24/2017  . Congestive heart failure (CHF) ([place]) 10/09/2016  . Rheumatoid arthritis ([place]) 10/09/2016  . Asthma 10/09/2016  . Chronic pain  10/09/2016  . Heart failure ([place]) 10/09/2016  . Congestive heart failure ([place])   . Lupus (systemic lupus erythematosus) ([place])   . Diabetes mellitus with complication ([place])   . Anxiety state   . GERD (gastroesophageal reflux disease) 12/28/2015  . Essential hypertension 12/26/2015  . Type 2 diabetes mellitus ([place]) 12/26/2015  . Chronic systolic heart failure ([place]) 08/18/2014  . Cardiomyopathy, dilated ([place]) 01/27/2014  . Shortness of breath 01/26/2014  . SLE (systemic lupus erythematosus) ([place]) 01/26/2014    Conditions to be addressed/monitored: CHF, HTN and DMII; care coordination  Care Plan : Social Work Care Plan  Updates made by Daneen Schick since 01/23/2021 12:00 AM    Problem: Care Coordination   Priority: Medium  Onset Date: 12/05/2020    Long-Range Goal: Collaborate with RN Care Manager to perform appropriate assessments to assist with care coordination needs   Start Date: 12/05/2020  Expected End Date: 04/04/2021  Recent Progress: On track  Priority: Low  Note:   Current  Barriers:  . Limited social support . Recent identification of a lump in L breast . Chronic conditions including CHF, DM II, and HTN which put patient at increased risk for hospitalization  Social Work Clinical Goal(s):  Marland Kitchen Over the next 90 days, patient will work with SW to address concerns related to care coordination  Interventions: . 1:1 collaboration with [name], MD regarding development and update of comprehensive plan of care as evidenced by provider attestation and co-signature . Inter-disciplinary care team collaboration (see longitudinal plan of care) . Successful outbound call placed to the patient to assess for care coordination needs . Discussed the patient has been sick since Friday with congestion and loss of smell. Patient reports she has taken 4 COVID tests and all were negative . Determined the patient has been in contact with [facility] who has diagnosed the patient with  bronchitis and a respiratory infection . Patient reports he blood sugar keeps dropping and she is out of Glucagon . Sent urgent in-basket message to patients primary provider [name] FNP as well as Orlando Penner PharmD requesting refill of medication. Patient indicated if [facility] can not deliver today she can pick up from [facility] on [address]. SW advised care team of this in urgent message . Collaboration with RN Care Manager advising of reported symptoms and concern with hypoglycemia . Scheduled follow up call over the next 14 days  Patient Goals/Self-Care Activities Over the next 30 days, patient will:   - Patient will self administer medications as prescribed Patient will attend all scheduled provider appointments Contact SW as needed prior to next scheduled call  Follow up Plan: SW will follow up with patient by phone over the next 14 days       Follow Up Plan: SW will follow up with patient by phone over the next 14 days      Daneen Schick, BSW, CDP Social Worker, Certified Dementia Practitioner [facility] [phone]  Total time spent performing care coordination and/or care management activities with the patient by phone or face to face = 10 minutes.

## 2021-01-23 NOTE — Patient Instructions (Signed)
   Goals we discussed today:  Goals Addressed            This Visit's Progress   . Work with SW to manage care coordination needs       Timeframe:  Long-Range Goal Priority:  Low Start Date:   12.14.21                          Expected End Date:   4.13.22                    Next date of contact: 2.7.22  Patient Goals/Self-Care Activities Over the next 30 days, patient will:   - Patient will self administer medications as prescribed Patient will attend all scheduled provider appointments Contact SW as needed prior to next scheduled call

## 2021-01-23 NOTE — Telephone Encounter (Signed)
The pt was scheduled an appt for a virtual and the pt said she couldn't do an appt tomorrow because her sister's funeral is tomorrow.

## 2021-01-25 ENCOUNTER — Telehealth (INDEPENDENT_AMBULATORY_CARE_PROVIDER_SITE_OTHER): Payer: Medicare Other | Admitting: Nurse Practitioner

## 2021-01-25 ENCOUNTER — Encounter: Payer: Self-pay | Admitting: Nurse Practitioner

## 2021-01-25 ENCOUNTER — Other Ambulatory Visit: Payer: Self-pay

## 2021-01-25 VITALS — Wt 190.0 lb

## 2021-01-25 DIAGNOSIS — J209 Acute bronchitis, unspecified: Secondary | ICD-10-CM | POA: Diagnosis not present

## 2021-01-25 MED ORDER — BENZONATATE 100 MG PO CAPS: 100.0000 mg | ORAL_CAPSULE | Freq: Four times a day (QID) | ORAL | 1 refills | Status: DC | PRN

## 2021-01-25 MED ORDER — BUDESONIDE-FORMOTEROL FUMARATE 80-4.5 MCG/ACT IN AERO: 2.0000 | INHALATION_SPRAY | Freq: Two times a day (BID) | RESPIRATORY_TRACT | 5 refills | Status: DC

## 2021-01-25 NOTE — Progress Notes (Signed)
Virtual Visit via MyChart   This visit type was conducted due to national recommendations for restrictions regarding the COVID-19 Pandemic (e.g. social distancing) in an effort to limit this patient's exposure and mitigate transmission in our community.  Due to her co-morbid illnesses, this patient is at least at moderate risk for complications without adequate follow up.  This format is felt to be most appropriate for this patient at this time.  All issues noted in this document were discussed and addressed.  A limited physical exam was performed with this format.    This visit type was conducted due to national recommendations for restrictions regarding the COVID-19 Pandemic (e.g. social distancing) in an effort to limit this patient's exposure and mitigate transmission in our community.  Patients identity confirmed using two different identifiers.  This format is felt to be most appropriate for this patient at this time.  All issues noted in this document were discussed and addressed.  No physical exam was performed (except for noted visual exam findings with Video Visits).    Date:  02/18/2021   ID:  Christine Mcdowell, DOB [birth year], MRN [id number]  Patient Location:  Home - spoke with [name]  Provider location:   Office    Chief Complaint:    History of Present Illness:    Christine Mcdowell is a 50 y.o. female who presents via video conferencing for a telehealth visit today.    The patient does have symptoms concerning for COVID-19 infection (fever, chills, cough, or new shortness of breath). Has been tested at least 2 times in the last 14 days  She reports her symptoms from her allergies back when she was seen in December 2021. She reports she has had a chest xray which she says was normal.  She was referred to a pulmonologist but was cancelled by the office.  She reports her white count was elevated and treated with antibiotic and robitussin for cough - doxycycline  100 mg.  She was tested for covid last Friday, with 3 PCRs all negative. She has had her covid booster January 17th.    She was advised to stop taking her insulin and to hold the metformin due to her blood sugar dropping to 49.  She is no longer on scheduled insulin - just finished her prednisone on Tuesday.  [facility] has come out to see her and done a CXR and labs.  She feels like she is a little better since taking the doxycycline last dose was Monday.    Blood sugar is 114 now.     Past Medical History:  Diagnosis Date  . AICD (automatic cardioverter/defibrillator) present 01/28/2018  . Anemia   . Anginal pain ([place])   . Asthma   . Cervical cancer ([place])    cervical 1996  . CHF (congestive heart failure) ([place])   . Diabetes mellitus without complication ([place])    steroid induced  . Discoid lupus   . Fibromyalgia   . History of blood transfusion "several"   "related to anemia; had some w/hysterectomy also"  . Hx of cardiovascular stress test    ETT-Myoview (9/15):  No ischemia, EF 52%; NORMAL  . Hx of echocardiogram    Echo (9/15):  EF 50-55%, ant HK, Gr 1 DD, mild MR, mild LAE, no effusion  . Hypertension   . Iron deficiency anemia    h/o iron transfusions  . Lupus (systemic lupus erythematosus) ([place])   . Migraine    "a few/year" (07/03/2016)  .  Pneumonia 12/2015  . RA (rheumatoid arthritis) ([place])    "all over" (07/03/2016)  . Sickle cell trait ([place])   . Stroke ([place]) 2014 X 1; 2015 X 2; 2016 X 1;    "right side of face more relaxed than the other; rare speech hesitation" (07/03/2016)  . Vaginal Pap smear, abnormal    ASCUS; HPV   Past Surgical History:  Procedure Laterality Date  . ABDOMINAL HYSTERECTOMY  2009  . ABDOMINAL WOUND DEHISCENCE  2009  . BUNIONECTOMY Left 06/15/2020  . CARDIAC CATHETERIZATION N/A 10/11/2016   Procedure: Right/Left Heart Cath and Coronary Angiography;  Surgeon: Jolaine Artist, MD;  Location: [facility];  Service: Cardiovascular;   Laterality: N/A;  . DILATION AND CURETTAGE OF UTERUS  1991  . HEMATOMA EVACUATION  2009   abdomen  . ICD IMPLANT  01/28/2018  . ICD IMPLANT N/A 01/28/2018   Procedure: ICD IMPLANT;  Surgeon: Evans Lance, MD;  Location: [facility];  Service: Cardiovascular;  Laterality: N/A;  . INCISE AND DRAIN ABCESS  2009 X 2   "abdomen after hysterectomy"  . KNEE ARTHROSCOPY Right 1997  . KNEE SURGERY Right   . TUBAL LIGATION  1996     Current Meds  Medication Sig  . albuterol (ACCUNEB) 1.25 MG/3ML nebulizer solution Take 3 mLs (1.25 mg total) by nebulization every 6 (six) hours as needed for wheezing. (Patient taking differently: Take 1 ampule by nebulization 3 (three) times daily as needed for wheezing.)  . aspirin 81 MG chewable tablet Chew 162 mg by mouth daily. Taking 2 tablets  . atorvastatin (LIPITOR) 10 MG tablet Take 1 tablet (10 mg total) by mouth daily.  . benzonatate (TESSALON PERLES) 100 MG capsule Take 1 capsule (100 mg total) by mouth every 6 (six) hours as needed for cough.  . budesonide-formoterol (SYMBICORT) 80-4.5 MCG/ACT inhaler Inhale 2 puffs into the lungs in the morning and at bedtime.  . carvedilol (COREG) 25 MG tablet TAKE ONE TABLET BY MOUTH EVERY MORNING and TAKE ONE TABLET BY MOUTH EVERY EVENING  . Continuous Blood Gluc Receiver (DEXCOM G6 RECEIVER) DEVI 1 Device by Does not apply route 3 (three) times daily before meals.  . Continuous Blood Gluc Sensor (DEXCOM G6 SENSOR) MISC Inject 1 each into the skin 3 (three) times daily.  . Continuous Blood Gluc Transmit (DEXCOM G6 TRANSMITTER) MISC 1 Device by Does not apply route 3 (three) times daily before meals.  Marland Kitchen ENTRESTO 97-103 MG TAKE ONE TABLET BY MOUTH EVERY MORNING and TAKE ONE TABLET BY MOUTH EVERY EVENING  . folic acid (FOLVITE) 1 MG tablet TAKE 1 TABLET BY MOUTH EVERY MORNING  . Glucagon (GVOKE HYPOPEN 2-PACK) 1 MG/0.2ML SOAJ Inject 1 mg into the skin as needed.  . hydrALAZINE (APRESOLINE) 25 MG tablet Take 3  tablets (75 mg total) by mouth 3 (three) times daily.  . hydroxychloroquine (PLAQUENIL) 200 MG tablet Take 1 tablet (200 mg total) by mouth 2 (two) times daily.  . insulin lispro (HUMALOG KWIKPEN) 100 UNIT/ML KwikPen Use according to sliding scale  . Insulin Pen Needle (NOVOFINE PLUS PEN NEEDLE) 32G X 4 MM MISC Use with insulin pens dx code e11.65  . Magnesium 400 MG TABS Take 1 tablet by mouth daily.   . meloxicam (MOBIC) 15 MG tablet Take 15 mg by mouth daily.   . metFORMIN (GLUCOPHAGE) 500 MG tablet TAKE ONE TABLET BY MOUTH EVERY MORNING and TAKE ONE TABLET BY MOUTH EVERY EVENING  . methotrexate (RHEUMATREX) 2.5  MG tablet Take 20 mg by mouth every Friday.   . metolazone (ZAROXOLYN) 2.5 MG tablet TAKE 1 TABLET BY MOUTH AS NEEDED FOR WEIGHT GAIN OF 5 POUNDS WITHIN 3 DAYS AS DIRECTED  . montelukast (SINGULAIR) 10 MG tablet TAKE ONE TABLET BY MOUTH EVERY MORNING  . Multiple Vitamin (MULTIVITAMIN WITH MINERALS) TABS Take 1 tablet by mouth every morning.   Marland Kitchen PARoxetine (PAXIL) 10 MG tablet TAKE ONE TABLET BY MOUTH EVERY MORNING  . potassium chloride SA (KLOR-CON) 20 MEQ tablet TAKE TWO TABLETS BY MOUTH EVERY MORNING  . PROAIR HFA 108 (90 Base) MCG/ACT inhaler INHALE 2 PUFFS BY MOUTH EVERY DAY AS NEEDED FOR SHORTNESS OF BREATH  . spironolactone (ALDACTONE) 25 MG tablet TAKE ONE TABLET BY MOUTH EVERY MORNING and TAKE ONE TABLET BY MOUTH EVERY EVENING  . torsemide (DEMADEX) 20 MG tablet Take 1 tablet Monday, Wednesday , and Friday  . Upadacitinib ER (RINVOQ) 15 MG TB24 Take 15 mg by mouth daily.  . [DISCONTINUED] ondansetron (ZOFRAN-ODT) 4 MG disintegrating tablet DISSOLVE ONE tab UNDER THE TONGUE every 12 hours as needed  . [DISCONTINUED] predniSONE (DELTASONE) 5 MG tablet Take 7.5 mg by mouth daily.  . [DISCONTINUED] Vitamin D, Ergocalciferol, (DRISDOL) 1.25 MG (50000 UNIT) CAPS capsule TAKE ONE CAPSULE BY MOUTH ONCE WEEKLY ON TUESDAY and TAKE ONE CAPSULE BY MOUTH ONCE WEEKLY ON FRIDAY      Allergies:   Hydrocodone-acetaminophen, Hydromorphone, Iodinated diagnostic agents, Other, Erythromycin, Latex, Tape, and Mircette [desogestrel-ethinyl estradiol]   Social History   Tobacco Use  . Smoking status: Never Smoker  . Smokeless tobacco: Never Used  Vaping Use  . Vaping Use: Never used  Substance Use Topics  . Alcohol use: No  . Drug use: No     Family Hx: The patient's family history includes Allergies in her father and mother; Arthritis in her mother; Asthma in her maternal grandmother; Cancer in her paternal grandfather; Cushing syndrome in her father; Dementia in her paternal grandmother; Depression in her father; Diabetes in her maternal grandmother; Drug abuse in her mother; Heart attack in her father and maternal grandfather; Heart murmur in her mother; Hypertension in her maternal grandmother. There is no history of Breast cancer.  ROS:   Please see the history of present illness.    Review of Systems  Constitutional: Negative.   Respiratory: Positive for cough. Negative for sputum production and shortness of breath.   Cardiovascular: Negative.   Neurological: Negative for dizziness, tingling and headaches.  Psychiatric/Behavioral: Negative.     All other systems reviewed and are negative.   Labs/Other Tests and Data Reviewed:    Recent Labs: 04/12/2020: TSH 0.652 04/19/2020: B Natriuretic Peptide 71.3 08/30/2020: ALT 14; BUN 16; Creatinine, Ser 1.02; Hemoglobin 11.2; Platelets 238; Potassium 3.7; Sodium 143   Recent Lipid Panel Lab Results  Component Value Date/Time   CHOL 196 08/30/2020 05:29 PM   TRIG 227 (H) 08/30/2020 05:29 PM   HDL 47 08/30/2020 05:29 PM   CHOLHDL 4.2 08/30/2020 05:29 PM   LDLCALC 110 (H) 08/30/2020 05:29 PM    Wt Readings from Last 3 Encounters:  01/25/21 190 lb (86.2 kg)  11/29/20 191 lb (86.6 kg)  08/31/20 191 lb 6.4 oz (86.8 kg)     Exam:    Vital Signs:  Wt 190 lb (86.2 kg)   BMI 31.62 kg/m     Physical  Exam Constitutional:      General: She is not in acute distress.    Appearance: Normal appearance.  Pulmonary:     Effort: Pulmonary effort is normal. No respiratory distress.     Breath sounds: No wheezing.  Neurological:     Mental Status: She is alert.  Psychiatric:        Mood and Affect: Mood normal.        Behavior: Behavior normal.        Thought Content: Thought content normal.        Judgment: Judgment normal.     ASSESSMENT & PLAN:    1. Acute bronchitis, unspecified organism  Will start her on symbicort to see if this helps with her persistent cough  She has also been seen by [facility] and treated with antibiotics. Has had several negative covid test - budesonide-formoterol (SYMBICORT) 80-4.5 MCG/ACT inhaler; Inhale 2 puffs into the lungs in the morning and at bedtime.  Dispense: 1 each; Refill: 5   COVID-19 Education: The signs and symptoms of COVID-19 were discussed with the patient and how to seek care for testing (follow up with PCP or arrange E-visit).  The importance of social distancing was discussed today.  Patient Risk:   After full review of this patients clinical status, I feel that they are at least moderate risk at this time.  Time:   Today, I have spent 11 minutes/ seconds with the patient with telehealth technology discussing above diagnoses.     Medication Adjustments/Labs and Tests Ordered: Current medicines are reviewed at length with the patient today.  Concerns regarding medicines are outlined above.   Tests Ordered: No orders of the defined types were placed in this encounter.   Medication Changes: Meds ordered this encounter  Medications  . budesonide-formoterol (SYMBICORT) 80-4.5 MCG/ACT inhaler    Sig: Inhale 2 puffs into the lungs in the morning and at bedtime.    Dispense:  1 each    Refill:  5  . benzonatate (TESSALON PERLES) 100 MG capsule    Sig: Take 1 capsule (100 mg total) by mouth every 6 (six) hours as needed for  cough.    Dispense:  30 capsule    Refill:  1    Disposition:  Follow up prn  Signed, [name], FNP

## 2021-01-26 ENCOUNTER — Ambulatory Visit (INDEPENDENT_AMBULATORY_CARE_PROVIDER_SITE_OTHER): Payer: Medicare Other

## 2021-01-26 ENCOUNTER — Telehealth: Payer: Medicare Other

## 2021-01-26 DIAGNOSIS — E1169 Type 2 diabetes mellitus with other specified complication: Secondary | ICD-10-CM | POA: Diagnosis not present

## 2021-01-26 DIAGNOSIS — I5022 Chronic systolic (congestive) heart failure: Secondary | ICD-10-CM

## 2021-01-26 DIAGNOSIS — J4520 Mild intermittent asthma, uncomplicated: Secondary | ICD-10-CM

## 2021-01-26 DIAGNOSIS — M069 Rheumatoid arthritis, unspecified: Secondary | ICD-10-CM

## 2021-01-26 DIAGNOSIS — I10 Essential (primary) hypertension: Secondary | ICD-10-CM | POA: Diagnosis not present

## 2021-01-26 DIAGNOSIS — M329 Systemic lupus erythematosus, unspecified: Secondary | ICD-10-CM

## 2021-01-29 ENCOUNTER — Telehealth: Payer: Self-pay

## 2021-01-29 ENCOUNTER — Telehealth: Payer: Medicare Other

## 2021-01-29 NOTE — Patient Instructions (Signed)
Goals Addressed      Patient Stated   .  Manage Chronic Sinusitis and Acute Bronchitis (pt-stated)   On track     Timeframe:  Long-Range Goal Priority:  High Start Date: 12/27/20                            Expected End Date: 03/27/21   Next Follow up date: 02/28/21  Over the next 90 days, patient will:  - f/u with ENT, Dr. Burna Forts for evaluation and treatment of chronic sinusitis - call PCP for concerns or to report new or worsening symptoms - stay well hydrated - start Nasacort or Flonase for symptom management per recommendations of PCP

## 2021-01-29 NOTE — Telephone Encounter (Signed)
  Chronic Care Management   Outreach Note  01/29/2021 Name: Christine Mcdowell MRN: [id number] DOB: 1971-11-14  Referred by: [name], MD Reason for referral : Care Coordination   An unsuccessful telephone outreach was attempted today. The patient was referred to the case management team for assistance with care management and care coordination.     Follow Up Plan: A HIPAA compliant phone message was left for the patient providing contact information and requesting a return call.  The care management team will reach out to the patient again over the next 21 days.   Daneen Schick, BSW, CDP Social Worker, Certified Dementia Practitioner [facility] [phone]

## 2021-01-29 NOTE — Chronic Care Management (AMB) (Signed)
Chronic Care Management   CCM RN Visit Note  01/26/2021 Name: Christine Mcdowell MRN: [id number] DOB: Jan 30, 1971  Subjective: Christine Mcdowell is a 50 y.o. year old female who is a primary care patient of Christine Chard, MD. The care management team was consulted for assistance with disease management and care coordination needs.    Engaged with patient by telephone for follow up visit in response to provider referral for case management and/or care coordination services.   Consent to Services:  The patient was given information about Chronic Care Management services, agreed to services, and gave verbal consent prior to initiation of services.  Please see initial visit note for detailed documentation.   Patient agreed to services and verbal consent obtained.   Assessment: Review of patient past medical history, allergies, medications, health status, including review of consultants reports, laboratory and other test data, was performed as part of comprehensive evaluation and provision of chronic care management services.   SDOH (Social Determinants of Health) assessments and interventions performed:  No  CCM Care Plan  Allergies  Allergen Reactions  . Hydromorphone Nausea And Vomiting    Other reaction(s): GI Upset (intolerance), Hypertension (intolerance) Raises blood pressure  Other reaction(s): GI Upset (intolerance), Hypertension (intolerance) Raises blood pressure to stroke level  . Iodinated Diagnostic Agents Other (See Comments)    Shuts down kidneys Shuts kidney function down  . Other Other (See Comments) and Anaphylaxis    Spicy foods and seasonings Skin Prep "makes my skin peel off" Paper tape causes skin burns  . Erythromycin Nausea And Vomiting  . Latex Hives  . Tape Other (See Comments)    "Skin burns"  . Mircette [Desogestrel-Ethinyl Estradiol] Nausea And Vomiting and Rash    Outpatient Encounter Medications as of 01/26/2021  Medication Sig Note  . albuterol  (ACCUNEB) 1.25 MG/3ML nebulizer solution Take 3 mLs (1.25 mg total) by nebulization every 6 (six) hours as needed for wheezing. (Patient taking differently: Take 1 ampule by nebulization 3 (three) times daily as needed for wheezing.)   . aspirin 81 MG chewable tablet Chew 162 mg by mouth daily. Taking 2 tablets   . atorvastatin (LIPITOR) 10 MG tablet Take 1 tablet (10 mg total) by mouth daily.   . benzonatate (TESSALON PERLES) 100 MG capsule Take 1 capsule (100 mg total) by mouth every 6 (six) hours as needed for cough.   . budesonide-formoterol (SYMBICORT) 80-4.5 MCG/ACT inhaler Inhale 2 puffs into the lungs in the morning and at bedtime.   . carvedilol (COREG) 25 MG tablet TAKE ONE TABLET BY MOUTH EVERY MORNING and TAKE ONE TABLET BY MOUTH EVERY EVENING   . Continuous Blood Gluc Receiver (DEXCOM G6 RECEIVER) DEVI 1 Device by Does not apply route 3 (three) times daily before meals.   . Continuous Blood Gluc Sensor (DEXCOM G6 SENSOR) MISC Inject 1 each into the skin 3 (three) times daily.   . Continuous Blood Gluc Transmit (DEXCOM G6 TRANSMITTER) MISC 1 Device by Does not apply route 3 (three) times daily before meals.   Marland Kitchen ENTRESTO 97-103 MG TAKE ONE TABLET BY MOUTH EVERY MORNING and TAKE ONE TABLET BY MOUTH EVERY EVENING   . folic acid (FOLVITE) 1 MG tablet TAKE 1 TABLET BY MOUTH EVERY MORNING   . Glucagon (GVOKE HYPOPEN 2-PACK) 1 MG/0.2ML SOAJ Inject 1 mg into the skin as needed.   . hydrALAZINE (APRESOLINE) 25 MG tablet Take 3 tablets (75 mg total) by mouth 3 (three) times daily.   . hydroxychloroquine (PLAQUENIL) 200 MG  tablet Take 1 tablet (200 mg total) by mouth 2 (two) times daily.   . insulin lispro (HUMALOG KWIKPEN) 100 UNIT/ML KwikPen Use according to sliding scale   . Insulin Pen Needle (NOVOFINE PLUS PEN NEEDLE) 32G X 4 MM MISC Use with insulin pens dx code e11.65   . Magnesium 400 MG TABS Take 1 tablet by mouth daily.    . meloxicam (MOBIC) 15 MG tablet Take 15 mg by mouth daily.    .  metFORMIN (GLUCOPHAGE) 500 MG tablet TAKE ONE TABLET BY MOUTH EVERY MORNING and TAKE ONE TABLET BY MOUTH EVERY EVENING   . methotrexate (RHEUMATREX) 2.5 MG tablet Take 20 mg by mouth every Friday.    . metolazone (ZAROXOLYN) 2.5 MG tablet TAKE 1 TABLET BY MOUTH AS NEEDED FOR WEIGHT GAIN OF 5 POUNDS WITHIN 3 DAYS AS DIRECTED   . montelukast (SINGULAIR) 10 MG tablet TAKE ONE TABLET BY MOUTH EVERY MORNING   . Multiple Vitamin (MULTIVITAMIN WITH MINERALS) TABS Take 1 tablet by mouth every morning.    . ondansetron (ZOFRAN-ODT) 4 MG disintegrating tablet DISSOLVE ONE tab UNDER THE TONGUE every 12 hours as needed   . PARoxetine (PAXIL) 10 MG tablet TAKE ONE TABLET BY MOUTH EVERY MORNING   . potassium chloride SA (KLOR-CON) 20 MEQ tablet TAKE TWO TABLETS BY MOUTH EVERY MORNING   . PROAIR HFA 108 (90 Base) MCG/ACT inhaler INHALE 2 PUFFS BY MOUTH EVERY DAY AS NEEDED FOR SHORTNESS OF BREATH   . spironolactone (ALDACTONE) 25 MG tablet TAKE ONE TABLET BY MOUTH EVERY MORNING and TAKE ONE TABLET BY MOUTH EVERY EVENING   . torsemide (DEMADEX) 20 MG tablet Take 1 tablet Monday, Wednesday , and Friday   . Upadacitinib ER (RINVOQ) 15 MG TB24 Take 15 mg by mouth daily. 01/02/2021: [name] - Rheumatology- Rheumatoid Arthritis and Fibromyalgia   . Vitamin D, Ergocalciferol, (DRISDOL) 1.25 MG (50000 UNIT) CAPS capsule TAKE ONE CAPSULE BY MOUTH ONCE WEEKLY ON TUESDAY and TAKE ONE CAPSULE BY MOUTH ONCE WEEKLY ON FRIDAY    No facility-administered encounter medications on file as of 01/26/2021.    Patient Active Problem List   Diagnosis Date Noted  . 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 History of COVID-19 04/12/2020  . COVID-19 virus infection 03/07/2020  . Lower abdominal pain 09/07/2019  . Chronic systolic (congestive) heart failure ([place]) 01/28/2018  . Cough 01/19/2018  . Pulmonary infiltrates on CXR 01/19/2018  . Migraines 10/08/2017  . Sleep apnea with use of continuous  positive airway pressure (CPAP) 09/24/2017  . Congestive heart failure (CHF) ([place]) 10/09/2016  . Rheumatoid arthritis ([place]) 10/09/2016  . Asthma 10/09/2016  . Chronic pain 10/09/2016  . Heart failure ([place]) 10/09/2016  . Congestive heart failure ([place])   . Lupus (systemic lupus erythematosus) ([place])   . Diabetes mellitus with complication ([place])   . Anxiety state   . GERD (gastroesophageal reflux disease) 12/28/2015  . Essential hypertension 12/26/2015  . Type 2 diabetes mellitus ([place]) 12/26/2015  . Chronic systolic heart failure ([place]) 08/18/2014  . Cardiomyopathy, dilated ([place]) 01/27/2014  . Shortness of breath 01/26/2014  . SLE (systemic lupus erythematosus) ([place]) 01/26/2014    Conditions to be addressed/monitored:CHF, DM II, HTN, SLE, RA, Asthma   Care Plan : Evaluation of chronic sinusitis  Updates made by [name], RN since 01/29/2021 12:00 AM    Problem: Evaluate and treat chronic sinusitis   Priority: Medium    Long-Range Goal: Manage allergies/chronic sinusitis   Start Date: 12/27/2020  Expected End Date: 03/27/2021  This Visit's Progress: On track  Recent Progress: On track  Priority: Medium  Note:   Current Barriers:   Ineffective Self Health Maintenance  Currently UNABLE TO independently self manage needs related to chronic health conditions.   Knowledge Deficits related to short term plan for care coordination needs and long term plans for chronic disease management needs Nurse Case Manager Clinical Goal(s):   Over the next 90 days, patient will work with care management team to address care coordination and chronic disease management needs related to Disease Management  Educational Needs  Care Coordination  Medication Management and Education  Psychosocial Support   Interventions:  01/26/21 Successful call completed with patient   Determined patient has a new dx of bronchitis, PCP is aware  Determined patient feels her symptoms are improving however,  she continues to rely on her inhalers for cough and shortness of breath   Determined she continues to be prescribed MTX for symptom management of Lupus/RA  Educated patient on potential SE related to use of MTX including risk for chronic infection and rationale for holding MTX during active infection   Determined patient will contact her Rheumatologist for recommendations regarding the MTX   Educated patient on signs and symptoms suggestive of worsening condition and when to call her PCP provider  Discussed plans with patient for ongoing care management follow up and provided patient with direct contact information for care management team Patient Goals/Self Care Activities:  Over the next 90 days, patient will:  - f/u with ENT, Dr. Burna Forts for evaluation and treatment of chronic sinusitis - call PCP for concerns or to report new or worsening symptoms - stay well hydrated - start Nasacort or Flonase for symptom management per recommendations of PCP Follow Up Plan: Telephone follow up appointment with care management team member scheduled for: 02/28/21     Plan:Telephone follow up appointment with care management team member scheduled for:  02/28/21  Barb Merino, RN, BSN, CCM Care Management Coordinator [facility]  Direct Phone: [phone]

## 2021-02-01 ENCOUNTER — Telehealth: Payer: Self-pay

## 2021-02-01 NOTE — Chronic Care Management (AMB) (Signed)
Chronic Care Management Pharmacy Assistant   Name: Christine Mcdowell  MRN: [id number] DOB: July 10, 1971  Reason for Encounter: Diabetes Adherence Call  Patient Questions:  1.  Have you seen any other providers since your last visit? Yes, 01/26/21-Little, Christine Stapler, RN (CCM). 01/25/21-Moore, Christine Burke, FNP (VV).     2.  Any changes in your medicines or health? Yes, 01/25/21-Benzonate 100 mg (added), Budesonide-Formoterol Fumarate 80-4.5 MCG/ACT (added), predniSONE 7.5 mg (disc).     PCP : Christine Chard, MD  Allergies:   Allergies  Allergen Reactions   Hydromorphone Nausea And Vomiting    Other reaction(s): GI Upset (intolerance), Hypertension (intolerance) Raises blood pressure  Other reaction(s): GI Upset (intolerance), Hypertension (intolerance) Raises blood pressure to stroke level   Iodinated Diagnostic Agents Other (See Comments)    Shuts down kidneys Shuts kidney function down   Other Other (See Comments) and Anaphylaxis    Spicy foods and seasonings Skin Prep "makes my skin peel off" Paper tape causes skin burns   Erythromycin Nausea And Vomiting   Latex Hives   Tape Other (See Comments)    "Skin burns"   Mircette [Desogestrel-Ethinyl Estradiol] Nausea And Vomiting and Rash    Medications: Outpatient Encounter Medications as of 02/01/2021  Medication Sig Note   albuterol (ACCUNEB) 1.25 MG/3ML nebulizer solution Take 3 mLs (1.25 mg total) by nebulization every 6 (six) hours as needed for wheezing. (Patient taking differently: Take 1 ampule by nebulization 3 (three) times daily as needed for wheezing.)    aspirin 81 MG chewable tablet Chew 162 mg by mouth daily. Taking 2 tablets    atorvastatin (LIPITOR) 10 MG tablet Take 1 tablet (10 mg total) by mouth daily.    benzonatate (TESSALON PERLES) 100 MG capsule Take 1 capsule (100 mg total) by mouth every 6 (six) hours as needed for cough.    budesonide-formoterol (SYMBICORT) 80-4.5 MCG/ACT inhaler Inhale 2  puffs into the lungs in the morning and at bedtime.    carvedilol (COREG) 25 MG tablet TAKE ONE TABLET BY MOUTH EVERY MORNING and TAKE ONE TABLET BY MOUTH EVERY EVENING    Continuous Blood Gluc Receiver (DEXCOM G6 RECEIVER) DEVI 1 Device by Does not apply route 3 (three) times daily before meals.    Continuous Blood Gluc Sensor (DEXCOM G6 SENSOR) MISC Inject 1 each into the skin 3 (three) times daily.    Continuous Blood Gluc Transmit (DEXCOM G6 TRANSMITTER) MISC 1 Device by Does not apply route 3 (three) times daily before meals.    ENTRESTO 97-103 MG TAKE ONE TABLET BY MOUTH EVERY MORNING and TAKE ONE TABLET BY MOUTH EVERY EVENING    folic acid (FOLVITE) 1 MG tablet TAKE 1 TABLET BY MOUTH EVERY MORNING    Glucagon (GVOKE HYPOPEN 2-PACK) 1 MG/0.2ML SOAJ Inject 1 mg into the skin as needed.    hydrALAZINE (APRESOLINE) 25 MG tablet Take 3 tablets (75 mg total) by mouth 3 (three) times daily.    hydroxychloroquine (PLAQUENIL) 200 MG tablet Take 1 tablet (200 mg total) by mouth 2 (two) times daily.    insulin lispro (HUMALOG KWIKPEN) 100 UNIT/ML KwikPen Use according to sliding scale    Insulin Pen Needle (NOVOFINE PLUS PEN NEEDLE) 32G X 4 MM MISC Use with insulin pens dx code e11.65    Magnesium 400 MG TABS Take 1 tablet by mouth daily.     meloxicam (MOBIC) 15 MG tablet Take 15 mg by mouth daily.     metFORMIN (GLUCOPHAGE) 500 MG tablet TAKE ONE  TABLET BY MOUTH EVERY MORNING and TAKE ONE TABLET BY MOUTH EVERY EVENING    methotrexate (RHEUMATREX) 2.5 MG tablet Take 20 mg by mouth every Friday.     metolazone (ZAROXOLYN) 2.5 MG tablet TAKE 1 TABLET BY MOUTH AS NEEDED FOR WEIGHT GAIN OF 5 POUNDS WITHIN 3 DAYS AS DIRECTED    montelukast (SINGULAIR) 10 MG tablet TAKE ONE TABLET BY MOUTH EVERY MORNING    Multiple Vitamin (MULTIVITAMIN WITH MINERALS) TABS Take 1 tablet by mouth every morning.     ondansetron (ZOFRAN-ODT) 4 MG disintegrating tablet DISSOLVE ONE tab UNDER THE TONGUE every  12 hours as needed    PARoxetine (PAXIL) 10 MG tablet TAKE ONE TABLET BY MOUTH EVERY MORNING    potassium chloride SA (KLOR-CON) 20 MEQ tablet TAKE TWO TABLETS BY MOUTH EVERY MORNING    PROAIR HFA 108 (90 Base) MCG/ACT inhaler INHALE 2 PUFFS BY MOUTH EVERY DAY AS NEEDED FOR SHORTNESS OF BREATH    spironolactone (ALDACTONE) 25 MG tablet TAKE ONE TABLET BY MOUTH EVERY MORNING and TAKE ONE TABLET BY MOUTH EVERY EVENING    torsemide (DEMADEX) 20 MG tablet Take 1 tablet Monday, Wednesday , and Friday    Upadacitinib ER (RINVOQ) 15 MG TB24 Take 15 mg by mouth daily. 01/02/2021: Christine Mcdowell - Rheumatology- Rheumatoid Arthritis and Fibromyalgia    Vitamin D, Ergocalciferol, (DRISDOL) 1.25 MG (50000 UNIT) CAPS capsule TAKE ONE CAPSULE BY MOUTH ONCE WEEKLY ON TUESDAY and TAKE ONE CAPSULE BY MOUTH ONCE WEEKLY ON FRIDAY    No facility-administered encounter medications on file as of 02/01/2021.    Current Diagnosis: Patient Active Problem List   Diagnosis Date Noted   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   History of COVID-19 04/12/2020   COVID-19 virus infection 03/07/2020   Lower abdominal pain 97/67/3419   Chronic systolic (congestive) heart failure ([place]) 01/28/2018   Cough 01/19/2018   Pulmonary infiltrates on CXR 01/19/2018   Migraines 10/08/2017   Sleep apnea with use of continuous positive airway pressure (CPAP) 09/24/2017   Congestive heart failure (CHF) ([place]) 10/09/2016   Rheumatoid arthritis ( [place]) 10/09/2016   Asthma 10/09/2016   Chronic pain 10/09/2016   Heart failure ([place]) 10/09/2016   Congestive heart failure ([place])    Lupus (systemic lupus erythematosus) ([place])    Diabetes mellitus with complication ([facility])    Anxiety state    GERD (gastroesophageal reflux disease) 12/28/2015   Essential hypertension 12/26/2015   Type 2 diabetes mellitus ([place]) 37/90/2409   Chronic systolic heart failure ([place]) 08/18/2014   Cardiomyopathy,  dilated ([place]) 01/27/2014   Shortness of breath 01/26/2014   SLE (systemic lupus erythematosus) ([place]) 01/26/2014   Recent Relevant Labs: Lab Results  Component Value Date/Time   HGBA1C 6.5 (H) 11/29/2020 04:50 PM   HGBA1C 6.9 (H) 08/30/2020 05:29 PM   MICROALBUR 10 08/24/2019 06:06 PM    Kidney Function Lab Results  Component Value Date/Time   CREATININE 1.02 (H) 08/30/2020 05:29 PM   CREATININE 1.93 (H) 06/05/2020 05:29 PM   CREATININE 0.89 10/04/2016 09:36 AM   CREATININE 0.85 03/27/2016 08:47 AM   GFR 86.64 02/28/2015 10:05 AM   GFRNONAA 64 08/30/2020 05:29 PM   GFRAA 74 08/30/2020 05:29 PM     Current antihyperglycemic regimen:  ? Metformin 500mg  with morning and evening meal ? HUMALOG KWIKPEN (on hold) ? Novolog Flexpen (on hold)   What recent interventions/DTPs have been made to improve glycemic control:  o Patient reports she is taking only the Metformin 500 mg. o  Patient stated she was instructed by [name], FNP to stop taking insulin because she is no longer on predniSONE due to her blood sugars dropping.   Have there been any recent hospitalizations or ED visits since last visit with CPP? No    Patient reports hypoglycemic symptoms, including Sweaty and Shaky    Patient denies hyperglycemic symptoms, including blurry vision, excessive thirst, fatigue, polyuria and weakness    How often are you checking your blood sugar? Patient has a continuous glucose receiver that checks periodically.    What are your blood sugars ranging? Patient reports 139 while eating. o Fasting: None o Before meals: none o After meals: None  o Bedtime: None   During the week, how often does your blood glucose drop below 70? 3 to 4 times a week. Usually in the morning when first waking up.    Are you checking your feet daily/regularly? Patient states she is checking her feet daily.   Adherence Review: Is the patient currently on a STATIN medication? Yes Is the patient  currently on ACE/ARB medication? No Does the patient have >5 day gap between last estimated fill dates? No    Goals Addressed            This Visit's Progress    Pharmacy Care Plan   Not on track    CARE PLAN ENTRY  Current Barriers:   Chronic Disease Management support, education, and care coordination needs related to Hypertension, Hyperlipidemia, Diabetes, and Heart Failure   Hypertension  Pharmacist Clinical Goal(s): o Over the next 90 days, patient will work with PharmD and providers to maintain BP goal <130/80  Current regimen:  o Carvedilol 25mg  twice daily with a meal o Spironolactone 25mg  twice daily o Hydralazine 25mg  three times daily  Interventions: o Recommended increase in physical activity  o Recommended heart healthy diet o Assisted with coordination of delivery of new, decreased dose of hydralazine  Patient self care activities - Over the next 4 weeks, patient will: o Check BP daily, document, and provide at future appointments o Ensure daily salt intake < 2300 mg/day o Exercise 15 minutes daily 5 times per week  Hyperlipidemia  Pharmacist Clinical Goal(s): o Over the next 90 days, patient will work with PharmD and providers to maintain LDL goal < 100  Current regimen:  o Atorvastatin 10mg  daily  Interventions: o Recommend increase in physical activity  Patient self care activities - Over the next 4 weeks, patient will: o Exercise 15 minutes daily 5 times per week o Limit fried foods and red meats  Diabetes  Pharmacist Clinical Goal(s): o Over the next 90 days, patient will work with PharmD and providers to maintain A1c goal <7%  Current regimen:  o Metformin 500mg  with morning and evening meal o Novolog Flexpen up to 17 units three times daily as directed per sliding scale o Tresiba 14 units daily  Interventions: o Continue to use Dexcom system o Send Dexcom readings to Pharmacy team to review readings  o Counseled on s/sx of hyper  and hypoglycemia  Patient self care activities - Over the next 4 weeks, patient will: o Continue using Dexcom system  o Contact provider with any episodes of hypoglycemia o Exercise 15 minutes daily 5 times per week  Heart Failure  Pharmacist Clinical Goal(s) o Over the next 90, patient will work with PharmD and providers to prevent weight gain and fluid accumulation  Current regimen:  o Carvedilol 25mg  twice daily with a meal o  Spironolactone 25mg  twice daily o Hydralazine 75mg  three times daily o Torsemide 20mg  on Monday, Wednesday, and Friday o Entresto 97-103mg  twice daily o Metolazone 2.5mg  as needed for weight gain of 5lbs within 3 days as directed  Interventions o Recommended to assess fluid status daily  Patient self care activities - Over the next 90 days, patient will: o Weigh daily, record, and provide at future appointments  Medication management  Pharmacist Clinical Goal(s): o Over the next 90 days, patient will work with PharmD and providers to maintain optimal medication adherence  Current pharmacy: [facility]  Interventions o Reviewed medications for upcoming synchronization and delivery  Patient self care activities - Over the next 4 weeks, patient will: o Focus on medication adherence by adherence packaging and medication synchronization o Take medications as prescribed o Report any questions or concerns to PharmD and/or provider(s)  Please see past updates related to this goal by clicking on the "Past Updates" button in the selected goal         Follow-Up:  Pharmacist Review-Patient voiced she was instructed by [name], FNP in Video Visit 01/25/21 to stop taking insulin because she is no longer on predniSONE due to her blood sugars dropping.  Orlando Penner, CPP Notified.  Raynelle Highland, [place] Pharmacist Assistant [phone] CCM Total Time: 23 mins

## 2021-02-02 ENCOUNTER — Telehealth: Payer: Self-pay

## 2021-02-02 NOTE — Chronic Care Management (AMB) (Signed)
Chronic Care Management Pharmacy Assistant   Name: Christine Mcdowell  MRN: [id number] DOB: 1971-03-02  Reason for Encounter: Medication Review  PCP : Christine Chard, MD   BP Readings from Last 3 Encounters:  11/29/20 118/76  08/31/20 118/68  08/30/20 136/76    Lab Results  Component Value Date   HGBA1C 6.5 (H) 11/29/2020     Patient obtains medications through Vials  30 Days Patient would like to change to Vials this month, easy open bottles  Last adherence delivery included:  Paroxetine 10 mg- 1 tablet daily(breakfast) Vitamin D 50,000 units- one capsules two times a week, Tuesday and Friday (breakfast) Hydroxychloroquine 200 mg- one tablet by mouth twice daily (breakfast and evening meal) Meloxicam 15 mg- One tablet by mouth daily (breakfast) Methotrexate Sodium 2.5 mg- eighttabletsby mouth once weekly onFriday (breakfast) Torsemide 20 mg- one tablet Monday, Wednesday and Friday(breakfast) Metformin500 mg- one tablet twice a day(breakfast and evening meal) Montelukast10 mg- one tablet daily( breakfast) Hydralazine 25 mg- 3 tablets three times daily (breakfast, evening meal and bedtime) Folic Acid 1 mg- 1 tablet daily (breakfast) Potassium CL ER 20 Meq- 2 tablets by mouth daily (breakfast and evening meal) Entresto 97 mg/103 mg- one tablet twice a day (breakfast and evening meal) Spironolactone 25 mg- one tablet twice a day(breakfast and evening meal) Carvedilol 25 mg- One tablet twice a day (breakfast and evening meal) Atorvastatin 10 mg- one tablet daily (bedtime) Vials: Metolazone 2.5 mg- 1 tablet by mouth as needed for weight gain over 5 lbs Ondansetron 4 mg- dissolve one tablet under tongue every twelve hours as needed for nausea.VIAL (Dissolvable tablets please) Trazodone 50 mg- 1 tablet at bedtime as needed for sleep (patient restarted medication) Diclofenac 1% gel- apply small amount to joints twice daily as needed (patient restarted  medication) Humalog Kwikpen- Inject 17 units sq three times daily with meals  Gvoke Hypopen- Inject 1 mg into skin as needed- patient would like a 90 day supply. Onetouch Verio test strips- Use as directed once daily Onetouch Del Mis Plus 33 G- Use as directed once daily.  Patient declined the following medications last month: Tresiba FlexPen due to not being covered by insurance- discontinued Novolog due to using an adequate supply, uses as a sliding scale. Tramadol 50 mg- 1 tablet daily due- Patient is no longer taking medication. Dexcom G6 Sensor Device- Patient receives through [employer]. Prednisone 10 mg- Patient has 2 more days left and will start Prednisone 5 mg 1.5 tablet daily for 30 days. Prednisone 5 mg- 1.5 tablet daily due to receiving 20 days supply on 12/19/2020. OTC- Centrum Silver Multivitamin and Vitamin B due to getting from [facility] with discount card. TruePlus Pen Needles 32 G- use as directed, patient has an adequate supply.  Patient is due for next adherence delivery on: 02/05/2021. 02/02/2021 called patient twice to review medications and coordinate delivery, no answer, left message to return call. 02/07/2021- Called patient, left message to return call. 02/12/2021- Patient called pharmacy stating she was out of medications, pharmacy aware she has been contacted 3 times with no return call. Called patient again, she answered and we reviewed medications and coordinated delivery.  This delivery to include: Paroxetine 10 mg- 1 tablet daily(breakfast) Vitamin D 50,000 units- one capsules two times a week, Tuesday and Friday (breakfast) Hydroxychloroquine 200 mg- one tablet by mouth twice daily (breakfast and evening meal) Meloxicam 15 mg- One tablet by mouth daily (breakfast) Methotrexate Sodium 2.5 mg- eighttabletsby mouth once weekly onFriday (breakfast)  Torsemide 20 mg- one tablet Monday, Wednesday and Friday(breakfast) Metformin500 mg- one tablet twice  a day(breakfast and evening meal) Montelukast10 mg- one tablet daily( breakfast) Hydralazine 25 mg- 3 tablets three times daily (breakfast, evening meal and bedtime) Folic Acid 1 mg- 1 tablet daily (breakfast) Potassium CL ER 20 Meq- 2 tablets by mouth daily (breakfast and evening meal) Entresto 97 mg/103 mg- one tablet twice a day (breakfast and evening meal) Spironolactone 25 mg- one tablet twice a day(breakfast and evening meal) Carvedilol 25 mg- One tablet twice a day (breakfast and evening meal) Atorvastatin 10 mg- one tablet daily (bedtime) Vials: Metolazone 2.5 mg- 1 tablet by mouth as needed for weight gain over 5 lbs Ondansetron 4 mg- dissolve one tablet under tongue every twelve hours as needed for nausea.VIAL (Dissolvable tablets please) Trazodone 50 mg- 1 tablet at bedtime as needed for sleep (patient restarted medication) Diclofenac 1% gel- apply small amount to joints twice daily as needed (patient restarted medication) Humalog Kwikpen- Inject 17 units sq three times daily with meals  Gvoke Hypopen- Inject 1 mg into skin as needed- patient would like a 90 day supply.    Patient declined the following medications: Onetouch Verio test strips- Use as directed once daily Onetouch Del Mis Plus 33 G- Use as directed once daily. Tyler Aas FlexPen due to not being covered by insurance- discontinued Novolog due to using an adequate supply, uses as a sliding scale. Tramadol 50 mg- 1 tablet daily due- Patient is no longer taking medication. Dexcom G6 Sensor Device- Patient receives through [employer]. Prednisone 10 mg- Patient has 2 more days left and will start Prednisone 5 mg 1.5 tablet daily for 30 days. Prednisone 5 mg- 1.5 tablet daily due to receiving 20 days supply on 12/19/2020. OTC- Centrum Silver Multivitamin and Vitamin B due to getting from [facility] with discount card. Patient needs refills for- None.  Confirmed delivery date of 02/12/2021, advised patient that  pharmacy will contact them the morning of delivery.  Follow-Up:  Coordination of Enhanced Pharmacy Services and Pharmacist Review  Christine Mcdowell, CPP notified patient out of medications, pharmacy was able to expedite order from CPP and patient to receive medications on 02/12/2021. Unable to obtain any blood sugar readings or blood pressure readings during call.  Christine Mcdowell, [place] Pharmacist Assistant [phone]

## 2021-02-05 ENCOUNTER — Other Ambulatory Visit: Payer: Self-pay | Admitting: Nurse Practitioner

## 2021-02-05 DIAGNOSIS — F4321 Adjustment disorder with depressed mood: Secondary | ICD-10-CM

## 2021-02-11 DIAGNOSIS — E1165 Type 2 diabetes mellitus with hyperglycemia: Secondary | ICD-10-CM | POA: Diagnosis not present

## 2021-02-12 ENCOUNTER — Ambulatory Visit (INDEPENDENT_AMBULATORY_CARE_PROVIDER_SITE_OTHER): Payer: Medicare Other

## 2021-02-12 ENCOUNTER — Other Ambulatory Visit: Payer: Self-pay | Admitting: Nurse Practitioner

## 2021-02-12 DIAGNOSIS — I5022 Chronic systolic (congestive) heart failure: Secondary | ICD-10-CM | POA: Diagnosis not present

## 2021-02-12 DIAGNOSIS — Z95810 Presence of automatic (implantable) cardiac defibrillator: Secondary | ICD-10-CM | POA: Diagnosis not present

## 2021-02-14 ENCOUNTER — Telehealth: Payer: Medicare Other

## 2021-02-14 ENCOUNTER — Telehealth: Payer: Self-pay

## 2021-02-14 NOTE — Telephone Encounter (Signed)
  Chronic Care Management   Outreach Note  02/14/2021 Name: Christine Mcdowell MRN: [id number] DOB: Nov 06, 1971  Referred by: [name], MD Reason for referral : Chronic Care Management   A second unsuccessful telephone outreach was attempted today. The patient was referred to the case management team for assistance with care management and care coordination.   Follow Up Plan: A HIPAA compliant phone message was left for the patient providing contact information and requesting a return call.  The care management team will reach out to the patient again over the next 21 days.   Daneen Schick, BSW, CDP Social Worker, Certified Dementia Practitioner [place] [facility] [place] [facility] [phone]

## 2021-02-15 ENCOUNTER — Telehealth: Payer: Self-pay | Admitting: *Deleted

## 2021-02-15 NOTE — Telephone Encounter (Signed)
Patient no showed PV today- Called patient and left message to return call by 5 pm today- If no call by 5 pm, PV and procedure will be canceled - no call at 5 pm -  PV and Procedure both canceled- No Show letter mailed to patient  

## 2021-02-21 ENCOUNTER — Other Ambulatory Visit: Payer: Self-pay

## 2021-02-21 MED ORDER — DAPAGLIFLOZIN PROPANEDIOL 10 MG PO TABS: 10.0000 mg | ORAL_TABLET | Freq: Every day | ORAL | 2 refills | Status: DC

## 2021-02-21 NOTE — Progress Notes (Signed)
EPIC Encounter for ICM Monitoring  Patient Name: Christine Mcdowell is a 50 y.o. female Date: 02/21/2021 Primary Care Physican: [name], MD Primary Cardiologist:Bensimhon Electrophysiologist:Taylor LastWeight:184lbs  Transmission reviewed.  2/27/2022HeartLogic Heart Failure Index13suggestingnormal fluid levels.  Thoracic impedance trending down.  Prescribed:  Torsemide20 mg take1tablet (20 mg total)Mon, Wed and Friday.Per Dr Clayborne Dana 08/21/2020 noteCan take extra torsemide or metolazone prn as needed  Potassium 20 mEq take2tabletsdaily.  Spironolactone 25 mg take 1 tablet daily.  Metolazone 2.5 mg takeone tablet as needed for weight gain of 5 lbs within 3 days.   Labs: 08/30/2020 Creatinine 1.02, BUN 16, Potassium 3.7, Sodium 143, GFR 64-74 06/05/2020 Creatinine 1.93, BUN 28, Potassium 3.7, Sodium 139, GFR 29-34 05/01/2020 Creatinine0.81, BUN14, Potassium3.3, Sodium143, GFR>60 04/19/2020 Creatinine0.81, BUN11, Potassium3.2, [id number], GFR>60  01/31/2020 Creatinine0.88, BUN13, Potassium3.4, Sodium141, GFR>60 A complete set of results can be found in Results Review.  Recommendations:No changes.   Follow-up plan: ICM clinic phone appointment on3/08/2021 to recheck fluid levels. 91 day device clinic remote transmission3/08/2021.   EP/Cardiology next office visit: Recall for9/10/2022withDr. Lovena Le   Copy of ICM check sent to Dr. Lovena Le.  3 Month Trend    8 Day Data Trend          [name], RN 02/21/2021 5:16 PM

## 2021-02-23 ENCOUNTER — Ambulatory Visit: Payer: Self-pay

## 2021-02-23 ENCOUNTER — Telehealth: Payer: Medicare Other

## 2021-02-23 DIAGNOSIS — E1169 Type 2 diabetes mellitus with other specified complication: Secondary | ICD-10-CM

## 2021-02-23 DIAGNOSIS — I5022 Chronic systolic (congestive) heart failure: Secondary | ICD-10-CM

## 2021-02-23 NOTE — Chronic Care Management (AMB) (Signed)
  Chronic Care Management   Outreach Note  02/23/2021 Name: Christine Mcdowell MRN: [id number] DOB: 17-Feb-1971  Referred by: [name], MD Reason for referral : Chronic Care Management   Third unsuccessful telephone outreach was attempted today. The patient was referred to the case management team for assistance with care management and care coordination. The patient's primary care provider has been notified of our unsuccessful attempts to make or maintain contact with the patient. The care management team is pleased to engage with this patient at any time in the future should he/she be interested in assistance from the care management team.   Follow Up Plan: No SW follow up planned at this time due to inability to maintain patient contact. Patient will remain engaged with RN Care Manager.  Daneen Schick, BSW, CDP Social Worker, Certified Dementia Practitioner [place] [facility] [place] [facility] [phone]

## 2021-02-27 ENCOUNTER — Ambulatory Visit: Payer: Medicare Other | Admitting: Nurse Practitioner

## 2021-02-28 ENCOUNTER — Ambulatory Visit (INDEPENDENT_AMBULATORY_CARE_PROVIDER_SITE_OTHER): Payer: Medicare Other

## 2021-02-28 ENCOUNTER — Telehealth: Payer: Medicare Other

## 2021-02-28 ENCOUNTER — Other Ambulatory Visit (HOSPITAL_COMMUNITY): Payer: Self-pay | Admitting: Internal Medicine

## 2021-02-28 ENCOUNTER — Telehealth: Payer: Self-pay

## 2021-02-28 DIAGNOSIS — I420 Dilated cardiomyopathy: Secondary | ICD-10-CM

## 2021-02-28 DIAGNOSIS — I5022 Chronic systolic (congestive) heart failure: Secondary | ICD-10-CM

## 2021-02-28 DIAGNOSIS — Z95810 Presence of automatic (implantable) cardiac defibrillator: Secondary | ICD-10-CM

## 2021-02-28 NOTE — Progress Notes (Signed)
EPIC Encounter for ICM Monitoring  Patient Name: Christine Mcdowell is a 50 y.o. female Date: 02/28/2021 Primary Care Physican: [name], MD Primary Cardiologist:Bensimhon Electrophysiologist:Taylor LastWeight:184lbs  Transmission reviewed.  3/8/2022HeartLogic Heart Failure Index12suggestingfluid levels returned to normal.  Thoracic impedance trending up.  Prescribed:  Torsemide20 mg take1tablet (20 mg total)Mon, Wed and Friday.Per Dr Clayborne Dana 08/21/2020 noteCan take extra torsemide or metolazone prn as needed  Potassium 20 mEq take2tabletsdaily.  Spironolactone 25 mg take 1 tablet daily.  Metolazone 2.5 mg takeone tablet as needed for weight gain of 5 lbs within 3 days.   Labs: 08/30/2020 Creatinine 1.02, BUN 16, Potassium 3.7, Sodium 143, GFR 64-74 06/05/2020 Creatinine 1.93, BUN 28, Potassium 3.7, Sodium 139, GFR 29-34 05/01/2020 Creatinine0.81, BUN14, Potassium3.3, Sodium143, GFR>60 04/19/2020 Creatinine0.81, BUN11, Potassium3.2, [id number], GFR>60  01/31/2020 Creatinine0.88, BUN13, Potassium3.4, Sodium141, GFR>60 A complete set of results can be found in Results Review.  Recommendations:No changes.   Follow-up plan: ICM clinic phone appointment on3/28/2022. 91 day device clinic remote transmission6/07/2021.   EP/Cardiology next office visit: Recall for9/10/2022withDr. Lovena Le   Copy of ICM check sent to Dr. Lovena Le..  3 Month Trend    8 Day Data Trend          [name], RN 02/28/2021 8:38 AM

## 2021-02-28 NOTE — Telephone Encounter (Signed)
  Chronic Care Management   Outreach Note  02/28/2021 Name: Christine Mcdowell MRN: [id number] DOB: 1971-05-05  Referred by: [name], MD Reason for referral : Chronic Care Management (RN CM FU Call Attempt)   An unsuccessful telephone outreach was attempted today. The patient was referred to the case management team for assistance with care management and care coordination.   Follow Up Plan: A HIPAA compliant phone message was left for the patient providing contact information and requesting a return call.  Telephone follow up appointment with care management team member scheduled for: 03/21/21  Barb Merino, RN, BSN, CCM Care Management Coordinator [place] [facility]  Direct Phone: [phone]

## 2021-03-01 ENCOUNTER — Encounter: Payer: Medicare Other | Admitting: Internal Medicine

## 2021-03-02 LAB — CUP PACEART REMOTE DEVICE CHECK
Battery Remaining Longevity: 156 mo
Battery Remaining Percentage: 100 %
Brady Statistic RV Percent Paced: 0 %
Date Time Interrogation Session: 20220309045000
HighPow Impedance: 70 Ohm
Implantable Lead Implant Date: 20190206
Implantable Lead Location: 753860
Implantable Lead Model: 292
Implantable Lead Serial Number: 438194
Implantable Pulse Generator Implant Date: 20190206
Lead Channel Impedance Value: 584 Ohm
Lead Channel Setting Pacing Amplitude: 2.5 V
Lead Channel Setting Pacing Pulse Width: 0.4 ms
Lead Channel Setting Sensing Sensitivity: 0.5 mV
Pulse Gen Serial Number: 243572

## 2021-03-06 ENCOUNTER — Telehealth: Payer: Self-pay

## 2021-03-06 NOTE — Chronic Care Management (AMB) (Signed)
Chronic Care Management Pharmacy Assistant   Name: Christine Mcdowell  MRN: [id number] DOB: June 06, 1971  Reason for Encounter: Medication Review   Recent office visits:  None  Recent consult visits:  None  Hospital visits:  None in previous 6 months   Reviewed chart for medication changes ahead of medication coordination call.  No medication changes indicated.  BP Readings from Last 3 Encounters:  11/29/20 118/76  08/31/20 118/68  08/30/20 136/76    Lab Results  Component Value Date   HGBA1C 6.5 (H) 11/29/2020     Patient obtains medications through Vials  30 Days   Last adherence delivery included:  Paroxetine 10 mg- 1 tablet daily(breakfast) Vitamin D 50,000 units- one capsules two times a week, Tuesday and Friday (breakfast) Hydroxychloroquine 200 mg- one tablet by mouth twice daily (breakfast and evening meal) Meloxicam 15 mg- One tablet by mouth daily (breakfast) Methotrexate Sodium 2.5 mg- eighttabletsby mouth once weekly onFriday (breakfast) Torsemide 20 mg- one tablet Monday, Wednesday and Friday(breakfast) Metformin500 mg- one tablet twice a day(breakfast and evening meal) Montelukast10 mg- one tablet daily( breakfast) Hydralazine 25 mg- 3 tablets three times daily (breakfast, evening meal and bedtime) Folic Acid 1 mg- 1 tablet daily (breakfast) Potassium CL ER 20 Meq- 2 tablets by mouth daily (breakfast and evening meal) Entresto 97 mg/103 mg- one tablet twice a day (breakfast and evening meal) Spironolactone 25 mg- one tablet twice a day(breakfast and evening meal) Carvedilol 25 mg- One tablet twice a day (breakfast and evening meal) Atorvastatin 10 mg- one tablet daily (bedtime) Vials: Metolazone 2.5 mg- 1 tablet by mouth as needed for weight gain over 5 lbs Ondansetron 4 mg- dissolve one tablet under tongue every twelve hours as needed for nausea.VIAL (Dissolvable tablets please) Trazodone 50 mg- 1 tablet at bedtime as needed for sleep  (patient restarted medication) Diclofenac 1% gel- apply small amount to joints twice daily as needed (patient restarted medication) Humalog Kwikpen- Inject 17 units sq three times daily with meals  Gvoke Hypopen- Inject 1 mg into skin as needed- patient would like a 90 day supply. Onetouch Verio test strips- Use as directed once daily Onetouch Del Mis Plus 33 G- Use as directed once daily.  Patient declined the following medications last month: Tresiba FlexPen due to not being covered by insurance- discontinued Novolog due to using an adequate supply, uses as a sliding scale. Tramadol 50 mg- 1 tablet daily due- Patient is no longer taking medication. Dexcom G6 Sensor Device- Patient receives through [employer]. Prednisone 10 mg- Patient has 2 more days left and will start Prednisone 5 mg 1.5 tablet daily for 30 days. Prednisone 5 mg- 1.5 tablet daily due to receiving 20 days supply on 12/19/2020. OTC- Centrum Silver Multivitamin and Vitamin B due to getting from [facility] with discount card. TruePlus Pen Needles 32 G- use as directed, patient has an adequate supply.   Patient is due for next adherence delivery on: 03/12/2021. 03/06/2021- Called patient no answer, left message to return call. 03/13/2021- Called patient, no answer left message to return call. 03/15/2021-Called patient and reviewed medications and coordinated delivery.  This delivery to include: Torsemide 20 mg- one tablet Monday, Wednesday and Friday Humalog Kwikpen- Inject 17 units sq three times daily with meals  Carvedilol 25 mg- One tablet twice a day  Hydralazine 25 mg- 3 tablets three times daily  Gvoke Hypopen- Inject 1 mg into skin as needed Folic Acid 1 mg- 1 tablet daily Potassium CL ER 20 Meq- 2  tablets by mouth daily Paroxetine 10 mg- 1 tablet daily Atorvastatin 10 mg- one tablet daily  Entresto 97 mg/103 mg- one tablet twice a day Trazodone 50 mg- 1 tablet at bedtime as needed for sleep (patient  restarted medication) Diclofenac 1% gel- apply small amount to joints twice daily as needed Hydroxychloroquine 200 mg- one tablet by mouth twice daily  Ondansetron 4 mg- dissolve one tablet under tongue every twelve hours as needed for nausea.VIAL (Dissolvable tablets please) Spironolactone 25 mg- one tablet twice a day Metformin500 mg- one tablet twice a day Montelukast10 mg- one tablet daily Methotrexate Sodium 2.5 mg- eighttabletsby mouth once weekly onFriday Meloxicam 15 mg- One tablet by mouth daily  Vitamin D 50,000 units- one capsules two times a week, Tuesday and Friday Budes/formot Aer 80-4.5 mcg- inhale 2 puffs into lungs in the morning and bedtime.  No short fill or acute fill needed.  Patient declined the following medications: Metolazone 2.5 mg- 1 tablet by mouth as needed for weight gain over 5 lbs due to using PRN weight gain. Patient still has #15 on hand. Onetouch Verio test strips- Use as directed once daily Onetouch Del Mis Plus 33 G- Use as directed once daily.   Due to using Dexcom CGM Tresiba FlexPen due to not being covered by insurance- discontinued Novolog due to using an adequate supply, uses as a sliding scale. Tramadol 50 mg- 1 tablet daily due- Patient is no longer taking medication. Dexcom G6 Sensor Device- Patient receives through [employer]. Prednisone 5 mg 1.5 tablet daily due to completing dose. Prednisone 10 mg- Due to receiving a 20 day supply on 02/15/2021 from [place]. OTC- Centrum Silver Multivitamin and Vitamin B due to getting from [facility] with discount card. TruePlus Pen Needles 32 G- use as directed, due to receiving a 90 day supply on 02/12/2021. Farxiga 10 mg- 1 tablet daily- due to patient discontinuing medication, never took due to hx of side effects- chronic yeast infection.  Patient needs refills for: Methotrexate Sodium 2.5 mg and Meloxicam 15 mg- Requested from Dr Shelby Mattocks office (Rheumatology).  Confirmed delivery date  of 03/16/2021, advised patient that pharmacy will contact them the morning of delivery.  Medications: Outpatient Encounter Medications as of 03/06/2021  Medication Sig Note  . albuterol (ACCUNEB) 1.25 MG/3ML nebulizer solution Take 3 mLs (1.25 mg total) by nebulization every 6 (six) hours as needed for wheezing. (Patient taking differently: Take 1 ampule by nebulization 3 (three) times daily as needed for wheezing.)   . aspirin 81 MG chewable tablet Chew 162 mg by mouth daily. Taking 2 tablets   . atorvastatin (LIPITOR) 10 MG tablet Take 1 tablet (10 mg total) by mouth daily.   . benzonatate (TESSALON PERLES) 100 MG capsule Take 1 capsule (100 mg total) by mouth every 6 (six) hours as needed for cough.   . budesonide-formoterol (SYMBICORT) 80-4.5 MCG/ACT inhaler Inhale 2 puffs into the lungs in the morning and at bedtime.   . carvedilol (COREG) 25 MG tablet TAKE ONE TABLET BY MOUTH EVERY MORNING and TAKE ONE TABLET BY MOUTH EVERY EVENING   . Continuous Blood Gluc Receiver (DEXCOM G6 RECEIVER) DEVI 1 Device by Does not apply route 3 (three) times daily before meals.   . Continuous Blood Gluc Sensor (DEXCOM G6 SENSOR) MISC Inject 1 each into the skin 3 (three) times daily.   . Continuous Blood Gluc Transmit (DEXCOM G6 TRANSMITTER) MISC 1 Device by Does not apply route 3 (three) times daily before meals.   Marland Kitchen  dapagliflozin propanediol (FARXIGA) 10 MG TABS tablet Take 1 tablet (10 mg total) by mouth daily before breakfast.   . ENTRESTO 97-103 MG TAKE ONE TABLET BY MOUTH EVERY MORNING and TAKE ONE TABLET BY MOUTH EVERY EVENING   . folic acid (FOLVITE) 1 MG tablet TAKE 1 TABLET BY MOUTH EVERY MORNING   . Glucagon (GVOKE HYPOPEN 2-PACK) 1 MG/0.2ML SOAJ Inject 1 mg into the skin as needed.   . hydrALAZINE (APRESOLINE) 25 MG tablet Take 3 tablets (75 mg total) by mouth 3 (three) times daily.   . hydroxychloroquine (PLAQUENIL) 200 MG tablet Take 1 tablet (200 mg total) by mouth 2 (two) times daily.   .  insulin lispro (HUMALOG KWIKPEN) 100 UNIT/ML KwikPen Use according to sliding scale   . Insulin Pen Needle (NOVOFINE PLUS PEN NEEDLE) 32G X 4 MM MISC Use with insulin pens dx code e11.65   . Magnesium 400 MG TABS Take 1 tablet by mouth daily.    . meloxicam (MOBIC) 15 MG tablet Take 15 mg by mouth daily.    . metFORMIN (GLUCOPHAGE) 500 MG tablet TAKE ONE TABLET BY MOUTH EVERY MORNING and TAKE ONE TABLET BY MOUTH EVERY EVENING   . methotrexate (RHEUMATREX) 2.5 MG tablet Take 20 mg by mouth every Friday.    . metolazone (ZAROXOLYN) 2.5 MG tablet TAKE 1 TABLET BY MOUTH AS NEEDED FOR WEIGHT GAIN OF 5 POUNDS WITHIN 3 DAYS AS DIRECTED   . montelukast (SINGULAIR) 10 MG tablet TAKE ONE TABLET BY MOUTH EVERY MORNING   . Multiple Vitamin (MULTIVITAMIN WITH MINERALS) TABS Take 1 tablet by mouth every morning.    . ondansetron (ZOFRAN-ODT) 4 MG disintegrating tablet DISSOLVE ONE TABLET UNDER THE TONGUE EVERY TWELVE HOURS AS NEEDED   . PARoxetine (PAXIL) 10 MG tablet TAKE ONE TABLET BY MOUTH EVERY MORNING   . potassium chloride SA (KLOR-CON) 20 MEQ tablet TAKE TWO TABLETS BY MOUTH EVERY MORNING   . PROAIR HFA 108 (90 Base) MCG/ACT inhaler INHALE 2 PUFFS BY MOUTH EVERY DAY AS NEEDED FOR SHORTNESS OF BREATH   . spironolactone (ALDACTONE) 25 MG tablet TAKE ONE TABLET BY MOUTH EVERY MORNING and TAKE ONE TABLET BY MOUTH EVERY EVENING   . torsemide (DEMADEX) 20 MG tablet TAKE ONE TABLET BY MOUTH weekly on monday, wednesday, and Friday Please call for an appointment for future refills   . Upadacitinib ER (RINVOQ) 15 MG TB24 Take 15 mg by mouth daily. 01/02/2021: [name] - Rheumatology- Rheumatoid Arthritis and Fibromyalgia   . Vitamin D, Ergocalciferol, (DRISDOL) 1.25 MG (50000 UNIT) CAPS capsule TAKE ONE CAPSULE BY MOUTH ONCE WEEKLY ON TUESDAY and TAKE ONE CAPSULE ONCE WEEKLY ON FRIDAY    No facility-administered encounter medications on file as of 03/06/2021.    Star Rating Drugs: Atrovastatin 10 mg - Last  filled 02/12/2021 for 30 day supply at [facility] flexpen - Last filled 03/09/2019 for 30 day supply at [place] filled 07/24/2020 for 65 day supply at [place] pen- Last filled 02/12/2021 for 30 day supply at [place] is checking blood pressures at home, recent readings at 132/89, 120/78 and 121/81.Patient checks blood sugars daily, recent fasting readings at 77, but can get as high as 220 and 189. Patient received a new blood pressure monitor with a tablet, that was given by her [facility] plan. Monitor uploads her blood pressures, blood sugars, oxygen  and weight that is all sent to a [facility] nurse. Patient  is back on Prednisone 10 mg, so her blood sugars are fluctuating but patient is monitoring. Patient received Wilder Glade with last delivery but did not start medications, she does not want to take due to medication giving her a yeast infection that last time she was on. Patient does not think the Paroxetine is helping, she has a responded Mychart message from PCP who suggest counseling and referral information was given. Patient will contact Psychiatry to schedule an appointment. Patient also had some complaints about Trazodone, she states it helps her fall sleep but does not keep her asleep but also wonders if the waking up in the middle of the night to urinate due to elevated blood sugars could be the problem also. But she has to be very careful when waking up, feels groggy after taking the Trazodone, does not want to fall getting up at night to urinate.  Patient is running low on Dexcom sensors, she will contact [facility] to check on order status.   SIG: Pattricia Boss, [place] Pharmacist Assistant [phone]

## 2021-03-09 NOTE — Progress Notes (Signed)
Remote ICD transmission.   

## 2021-03-11 DIAGNOSIS — E1165 Type 2 diabetes mellitus with hyperglycemia: Secondary | ICD-10-CM | POA: Diagnosis not present

## 2021-03-14 ENCOUNTER — Encounter: Payer: Self-pay | Admitting: Nurse Practitioner

## 2021-03-15 ENCOUNTER — Other Ambulatory Visit (HOSPITAL_COMMUNITY): Payer: Self-pay | Admitting: Internal Medicine

## 2021-03-16 DIAGNOSIS — R350 Frequency of micturition: Secondary | ICD-10-CM | POA: Diagnosis not present

## 2021-03-21 ENCOUNTER — Encounter: Payer: Self-pay | Admitting: Internal Medicine

## 2021-03-21 ENCOUNTER — Ambulatory Visit (INDEPENDENT_AMBULATORY_CARE_PROVIDER_SITE_OTHER): Payer: Medicare Other

## 2021-03-21 ENCOUNTER — Telehealth: Payer: Medicare Other

## 2021-03-21 DIAGNOSIS — I10 Essential (primary) hypertension: Secondary | ICD-10-CM

## 2021-03-21 DIAGNOSIS — E1169 Type 2 diabetes mellitus with other specified complication: Secondary | ICD-10-CM

## 2021-03-21 DIAGNOSIS — M069 Rheumatoid arthritis, unspecified: Secondary | ICD-10-CM

## 2021-03-21 DIAGNOSIS — J4520 Mild intermittent asthma, uncomplicated: Secondary | ICD-10-CM

## 2021-03-21 DIAGNOSIS — I5022 Chronic systolic (congestive) heart failure: Secondary | ICD-10-CM

## 2021-03-21 DIAGNOSIS — M329 Systemic lupus erythematosus, unspecified: Secondary | ICD-10-CM

## 2021-03-23 ENCOUNTER — Ambulatory Visit (INDEPENDENT_AMBULATORY_CARE_PROVIDER_SITE_OTHER): Payer: Medicare Other

## 2021-03-23 DIAGNOSIS — I5022 Chronic systolic (congestive) heart failure: Secondary | ICD-10-CM

## 2021-03-23 DIAGNOSIS — E1169 Type 2 diabetes mellitus with other specified complication: Secondary | ICD-10-CM

## 2021-03-23 DIAGNOSIS — I10 Essential (primary) hypertension: Secondary | ICD-10-CM

## 2021-03-23 NOTE — Patient Instructions (Signed)
Social Worker Visit Information  Goals we discussed today:  Goals Addressed            This Visit's Progress   . Quality of Life Maintained       Timeframe:  Long-Range Goal Priority:  High Start Date:    4.1.22                         Expected End Date:  7.30.22                     Next planned outreach: 4.12.22  Patient Goals/Self-Care Activities . Over the next 14 days, patient will:   - Patient will attend all scheduled provider appointments Patient will call provider office for new concerns or questions Contact SW as needed prior to next scheduled call         Follow Up Plan: SW will follow up with patient by phone over the next 14 days.   Daneen Schick, BSW, CDP Social Worker, Certified Dementia Practitioner [place] [facility] [place] [facility] [phone]

## 2021-03-23 NOTE — Chronic Care Management (AMB) (Signed)
Chronic Care Management    Social Work Note  03/23/2021 Name: Christine Mcdowell MRN: [id number] DOB: [birth year]  Christine Mcdowell is a 50 y.o. year old female who is a primary care patient of [name], MD. The CCM team was consulted to assist the patient with chronic disease management and/or care coordination needs related to: [employer] .   Engaged with patient by telephone for follow up visit in response to provider referral for social work chronic care management and care coordination services.   Consent to Services:  The patient was given information about Chronic Care Management services, agreed to services, and gave verbal consent prior to initiation of services.  Please see initial visit note for detailed documentation.   Patient agreed to services and consent obtained.   Assessment: Review of patient past medical history, allergies, medications, and health status, including review of relevant consultants reports was performed today as part of a comprehensive evaluation and provision of chronic care management and care coordination services.     SDOH (Social Determinants of Health) assessments and interventions performed:    Advanced Directives Status: Not addressed in this encounter.  CCM Care Plan  Allergies  Allergen Reactions  . Hydrocodone-Acetaminophen Nausea And Vomiting  . Hydromorphone Nausea And Vomiting    Other reaction(s): GI Upset (intolerance), Hypertension (intolerance) Raises blood pressure  Other reaction(s): GI Upset (intolerance), Hypertension (intolerance) Raises blood pressure to stroke level  . Iodinated Diagnostic Agents Other (See Comments)    Shuts down kidneys Shuts kidney function down  . Other Other (See Comments) and Anaphylaxis    Spicy foods and seasonings Skin Prep "makes my skin peel off" Paper tape causes skin burns  . Erythromycin Nausea And Vomiting  . Latex Hives  . Tape Other (See Comments)    "Skin burns"  .  Mircette [Desogestrel-Ethinyl Estradiol] Nausea And Vomiting and Rash    Outpatient Encounter Medications as of 03/23/2021  Medication Sig Note  . albuterol (ACCUNEB) 1.25 MG/3ML nebulizer solution Take 3 mLs (1.25 mg total) by nebulization every 6 (six) hours as needed for wheezing. (Patient taking differently: Take 1 ampule by nebulization 3 (three) times daily as needed for wheezing.)   . aspirin 81 MG chewable tablet Chew 162 mg by mouth daily. Taking 2 tablets   . atorvastatin (LIPITOR) 10 MG tablet Take 1 tablet (10 mg total) by mouth daily.   . benzonatate (TESSALON PERLES) 100 MG capsule Take 1 capsule (100 mg total) by mouth every 6 (six) hours as needed for cough.   . budesonide-formoterol (SYMBICORT) 80-4.5 MCG/ACT inhaler Inhale 2 puffs into the lungs in the morning and at bedtime.   . carvedilol (COREG) 25 MG tablet TAKE ONE TABLET BY MOUTH EVERY MORNING and TAKE ONE TABLET BY MOUTH EVERY EVENING   . Continuous Blood Gluc Receiver (DEXCOM G6 RECEIVER) DEVI 1 Device by Does not apply route 3 (three) times daily before meals.   . Continuous Blood Gluc Sensor (DEXCOM G6 SENSOR) MISC Inject 1 each into the skin 3 (three) times daily.   . Continuous Blood Gluc Transmit (DEXCOM G6 TRANSMITTER) MISC 1 Device by Does not apply route 3 (three) times daily before meals.   . dapagliflozin propanediol (FARXIGA) 10 MG TABS tablet Take 1 tablet (10 mg total) by mouth daily before breakfast.   . ENTRESTO 97-103 MG TAKE ONE TABLET BY MOUTH EVERY MORNING and TAKE ONE TABLET BY MOUTH EVERY EVENING   . folic acid (FOLVITE) 1 MG tablet TAKE 1 TABLET BY MOUTH  EVERY MORNING   . Glucagon (GVOKE HYPOPEN 2-PACK) 1 MG/0.2ML SOAJ Inject 1 mg into the skin as needed.   . hydrALAZINE (APRESOLINE) 25 MG tablet Take 3 tablets (75 mg total) by mouth 3 (three) times daily.   . hydroxychloroquine (PLAQUENIL) 200 MG tablet Take 1 tablet (200 mg total) by mouth 2 (two) times daily.   . insulin lispro (HUMALOG KWIKPEN)  100 UNIT/ML KwikPen Use according to sliding scale   . Insulin Pen Needle (NOVOFINE PLUS PEN NEEDLE) 32G X 4 MM MISC Use with insulin pens dx code e11.65   . Magnesium 400 MG TABS Take 1 tablet by mouth daily.    . meloxicam (MOBIC) 15 MG tablet Take 15 mg by mouth daily.    . metFORMIN (GLUCOPHAGE) 500 MG tablet TAKE ONE TABLET BY MOUTH EVERY MORNING and TAKE ONE TABLET BY MOUTH EVERY EVENING   . methotrexate (RHEUMATREX) 2.5 MG tablet Take 20 mg by mouth every Friday.    . metolazone (ZAROXOLYN) 2.5 MG tablet TAKE 1 TABLET BY MOUTH AS NEEDED FOR WEIGHT GAIN OF 5 POUNDS WITHIN 3 DAYS AS DIRECTED needs appt for further refills   . montelukast (SINGULAIR) 10 MG tablet TAKE ONE TABLET BY MOUTH EVERY MORNING   . Multiple Vitamin (MULTIVITAMIN WITH MINERALS) TABS Take 1 tablet by mouth every morning.    . ondansetron (ZOFRAN-ODT) 4 MG disintegrating tablet DISSOLVE ONE TABLET UNDER THE TONGUE EVERY TWELVE HOURS AS NEEDED   . PARoxetine (PAXIL) 10 MG tablet TAKE ONE TABLET BY MOUTH EVERY MORNING   . potassium chloride SA (KLOR-CON) 20 MEQ tablet TAKE TWO TABLETS BY MOUTH EVERY MORNING   . PROAIR HFA 108 (90 Base) MCG/ACT inhaler INHALE 2 PUFFS BY MOUTH EVERY DAY AS NEEDED FOR SHORTNESS OF BREATH   . spironolactone (ALDACTONE) 25 MG tablet TAKE ONE TABLET BY MOUTH EVERY MORNING and TAKE ONE TABLET BY MOUTH EVERY EVENING   . torsemide (DEMADEX) 20 MG tablet TAKE ONE TABLET BY MOUTH weekly on monday, wednesday, and Friday Please call for an appointment for future refills   . Upadacitinib ER (RINVOQ) 15 MG TB24 Take 15 mg by mouth daily. 01/02/2021: [name] - Rheumatology- Rheumatoid Arthritis and Fibromyalgia   . Vitamin D, Ergocalciferol, (DRISDOL) 1.25 MG (50000 UNIT) CAPS capsule TAKE ONE CAPSULE BY MOUTH ONCE WEEKLY ON TUESDAY and TAKE ONE CAPSULE ONCE WEEKLY ON FRIDAY    No facility-administered encounter medications on file as of 03/23/2021.    Patient Active Problem List   Diagnosis Date  Noted  . 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 History of COVID-19 04/12/2020  . COVID-19 virus infection 03/07/2020  . Lower abdominal pain 09/07/2019  . Chronic systolic (congestive) heart failure ([place]) 01/28/2018  . Cough 01/19/2018  . Pulmonary infiltrates on CXR 01/19/2018  . Migraines 10/08/2017  . Sleep apnea with use of continuous positive airway pressure (CPAP) 09/24/2017  . Congestive heart failure (CHF) ([place]) 10/09/2016  . Rheumatoid arthritis ([place]) 10/09/2016  . Asthma 10/09/2016  . Chronic pain 10/09/2016  . Heart failure ([place]) 10/09/2016  . Congestive heart failure ([place])   . Lupus (systemic lupus erythematosus) ([place])   . Diabetes mellitus with complication ([place])   . Anxiety state   . GERD (gastroesophageal reflux disease) 12/28/2015  . Essential hypertension 12/26/2015  . Type 2 diabetes mellitus ([place]) 12/26/2015  . Chronic systolic heart failure ([place]) 08/18/2014  . Cardiomyopathy, dilated ([place]) 01/27/2014  . Shortness of breath 01/26/2014  . SLE (systemic lupus erythematosus) ([place])  01/26/2014    Conditions to be addressed/monitored: CHF, HTN and DMII; Limited social support  Care Plan : Social Work [place]  Updates made by Daneen Schick since 03/23/2021 12:00 AM    Problem: Quality of Life (General Plan of Care)     Long-Range Goal: Quality of Life Maintained   Start Date: 03/23/2021  Expected End Date: 07/21/2021  Priority: High  Note:   Current Barriers:  . Chronic disease management support and education needs related to CHF, HTN, and DM  . Limited Social Support . Grief  Social Worker Clinical Goal(s):  Marland Kitchen Over the next 120 days, patient will work with SW to identify and address any acute and/or chronic care coordination needs related to the self health management of CHF, HTN, and DM  . Over the next 90 days the patient will work with SW to learn how to apply for an emotional support animal  CCM SW Interventions:   . Inter-disciplinary care team collaboration (see longitudinal plan of care) . Collaboration with [name], MD regarding development and update of comprehensive plan of care as evidenced by provider attestation and co-signature . Collaboration with [place] to discuss patients desire to register her new puppy as an emotional support animal . Successful outbound call placed to the patient to assess for resource needs . Discussed the patient has experienced a high level of grief this month and would like to register her new puppy as an emotional support animal so she may take her with her when she leaves her home . Advised the patient SW would look into resources to assist with this request . Scheduled follow up call over the next 14 days  Patient Goals/Self-Care Activities . Over the next 14 days, patient will:   - Patient will attend all scheduled provider appointments Patient will call provider office for new concerns or questions Contact SW as needed prior to next scheduled call  Follow Up Plan:  SW will follow up with the patient over the next 14 days       Follow Up Plan: SW will follow up with patient by phone over the next 14 days.      Daneen Schick, BSW, CDP Social Worker, Certified Dementia Practitioner [place] [facility] [place] [facility] [phone]  Total time spent performing care coordination and/or care management activities with the patient by phone or face to face = 20 minutes.

## 2021-03-27 ENCOUNTER — Ambulatory Visit: Payer: Medicare Other | Admitting: Nurse Practitioner

## 2021-03-27 NOTE — Patient Instructions (Signed)
Goals Addressed      Patient Stated   .  Manage Chronic Sinusitis and Acute Bronchitis (pt-stated)        Timeframe:  Long-Range Goal Priority:  High Start Date: 12/27/20                            Expected End Date: 06/26/21   Next Follow up date: 04/24/21  Over the next 90 days, patient will:  - f/u with ENT, Dr. Burna Forts for evaluation and treatment of chronic sinusitis - call PCP for concerns or to report new or worsening symptoms - stay well hydrated - start Nasacort or Flonase for symptom management per recommendations of PCP                         .  Manage SLE and RA symptoms (pt-stated)        Timeframe:  Long-Range Goal Priority:  High Start Date:  12/27/20                           Expected End Date:  06/26/21   Next Follow up date: 04/24/21  Over the next 90 days, patient will:  - call Rheumatologist with concerns or worsening symptoms related to SLE and or RA  - take all medications exactly as prescribed - keep all scheduled MD appointments                           Other   .  Begin and Stick with Counseling-Depression   On track     Timeframe:  Long-Range Goal Priority:  High Start Date:  03/21/21                            Expected End Date:  09/21/21                     Follow Up Date: 04/24/21    Self Care Activities:  . Continue to keep all scheduled follow up appointments . Take medications as directed  . Let your healthcare team know if you are unable to take your medications . Call your pharmacy for refills at least 7 days prior to running out of medication . Work with the embedded BSW regarding resources to help you learn how to register your pet as an emotional support animal Patient Goals: - to start grief counseling and have my puppy registered as an emotional support dog   Why is this important?    Beating depression may take some time.   If you don't feel better right away, don't give up on your treatment plan.    Notes:     Marland Kitchen  Manage  Symptoms of Auto-immune disorder - Lupus        Timeframe:  Long-Range Goal Priority:  High Start Date: 12/27/20                            Expected End Date: 06/26/21  Next Follow Up Date: 06/26/21  Patient Goals/Self-Care Activities:  . Continue to keep all scheduled follow up appointments . Take medications as directed  . Let your healthcare team know if you are unable to take your medications . Call your pharmacy for refills at least 7 days  prior to running out of medication . Notify your Rheumatologist promptly with new or worsening symptoms related to Lupus                        .  Monitor and Manage My Blood Sugar-Diabetes Type 2        Timeframe:  Long-Range Goal Priority:  High Start Date: 12/27/20                            Expected End Date:  06/26/21                     Follow Up Date: 04/24/21   Over the next 90 days, patient will:  - check blood sugar at prescribed times - check blood sugar before and after exercise - check blood sugar if I feel it is too high or too low - enter blood sugar readings and medication or insulin into daily log - take the blood sugar log to all doctor visits - take the blood sugar meter to all doctor visits  - follow sliding scale per PCP recommendations for treatment of hyperglycemia during use of steroids   Why is this important?    Checking your blood sugar at home helps to keep it from getting very high or very low.   Writing the results in a diary or log helps the doctor know how to care for you.   Your blood sugar log should have the time, date and the results.   Also, write down the amount of insulin or other medicine that you take.   Other information, like what you ate, exercise done and how you were feeling, will also be helpful.     Notes:     .  Track and Manage My Blood Pressure-Hypertension        Timeframe:  Long-Range Goal Priority:  High Start Date:  12/27/20                          Expected End Date:  06/26/21                      Follow Up Date: 04/24/21   - check blood pressure 3 times per week - write blood pressure results in a log or diary    Why is this important?    You won't feel high blood pressure, but it can still hurt your blood vessels.   High blood pressure can cause heart or kidney problems. It can also cause a stroke.   Making lifestyle changes like losing a Kerigan Narvaez weight or eating less salt will help.   Checking your blood pressure at home and at different times of the day can help to control blood pressure.   If the doctor prescribes medicine remember to take it the way the doctor ordered.   Call the office if you cannot afford the medicine or if there are questions about it.     Notes:

## 2021-03-27 NOTE — Chronic Care Management (AMB) (Signed)
Chronic Care Management   CCM RN Visit Note  03/21/2021 Name: Christine Mcdowell MRN: [id number] DOB: 03/05/1971  Subjective: Christine Mcdowell is a 50 y.o. year old female who is a primary care patient of [name], MD. The care management team was consulted for assistance with disease management and care coordination needs.    Engaged with patient by telephone for follow up visit in response to provider referral for case management and/or care coordination services.   Consent to Services:  The patient was given information about Chronic Care Management services, agreed to services, and gave verbal consent prior to initiation of services.  Please see initial visit note for detailed documentation.   Patient agreed to services and verbal consent obtained.   Assessment: Review of patient past medical history, allergies, medications, health status, including review of consultants reports, laboratory and other test data, was performed as part of comprehensive evaluation and provision of chronic care management services.   SDOH (Social Determinants of Health) assessments and interventions performed:  Yes, see care plan   CCM Care Plan  Allergies  Allergen Reactions  . Hydrocodone-Acetaminophen Nausea And Vomiting  . Hydromorphone Nausea And Vomiting    Other reaction(s): GI Upset (intolerance), Hypertension (intolerance) Raises blood pressure  Other reaction(s): GI Upset (intolerance), Hypertension (intolerance) Raises blood pressure to stroke level  . Iodinated Diagnostic Agents Other (See Comments)    Shuts down kidneys Shuts kidney function down  . Other Other (See Comments) and Anaphylaxis    Spicy foods and seasonings Skin Prep "makes my skin peel off" Paper tape causes skin burns  . Erythromycin Nausea And Vomiting  . Latex Hives  . Tape Other (See Comments)    "Skin burns"  . Mircette [Desogestrel-Ethinyl Estradiol] Nausea And Vomiting and Rash    Outpatient  Encounter Medications as of 03/21/2021  Medication Sig Note  . albuterol (ACCUNEB) 1.25 MG/3ML nebulizer solution Take 3 mLs (1.25 mg total) by nebulization every 6 (six) hours as needed for wheezing. (Patient taking differently: Take 1 ampule by nebulization 3 (three) times daily as needed for wheezing.)   . aspirin 81 MG chewable tablet Chew 162 mg by mouth daily. Taking 2 tablets   . atorvastatin (LIPITOR) 10 MG tablet Take 1 tablet (10 mg total) by mouth daily.   . benzonatate (TESSALON PERLES) 100 MG capsule Take 1 capsule (100 mg total) by mouth every 6 (six) hours as needed for cough.   . budesonide-formoterol (SYMBICORT) 80-4.5 MCG/ACT inhaler Inhale 2 puffs into the lungs in the morning and at bedtime.   . carvedilol (COREG) 25 MG tablet TAKE ONE TABLET BY MOUTH EVERY MORNING and TAKE ONE TABLET BY MOUTH EVERY EVENING   . Continuous Blood Gluc Receiver (DEXCOM G6 RECEIVER) DEVI 1 Device by Does not apply route 3 (three) times daily before meals.   . Continuous Blood Gluc Sensor (DEXCOM G6 SENSOR) MISC Inject 1 each into the skin 3 (three) times daily.   . Continuous Blood Gluc Transmit (DEXCOM G6 TRANSMITTER) MISC 1 Device by Does not apply route 3 (three) times daily before meals.   . dapagliflozin propanediol (FARXIGA) 10 MG TABS tablet Take 1 tablet (10 mg total) by mouth daily before breakfast.   . ENTRESTO 97-103 MG TAKE ONE TABLET BY MOUTH EVERY MORNING and TAKE ONE TABLET BY MOUTH EVERY EVENING   . folic acid (FOLVITE) 1 MG tablet TAKE 1 TABLET BY MOUTH EVERY MORNING   . Glucagon (GVOKE HYPOPEN 2-PACK) 1 MG/0.2ML SOAJ Inject 1 mg into  the skin as needed.   . hydrALAZINE (APRESOLINE) 25 MG tablet Take 3 tablets (75 mg total) by mouth 3 (three) times daily.   . hydroxychloroquine (PLAQUENIL) 200 MG tablet Take 1 tablet (200 mg total) by mouth 2 (two) times daily.   . insulin lispro (HUMALOG KWIKPEN) 100 UNIT/ML KwikPen Use according to sliding scale   . Insulin Pen Needle (NOVOFINE  PLUS PEN NEEDLE) 32G X 4 MM MISC Use with insulin pens dx code e11.65   . Magnesium 400 MG TABS Take 1 tablet by mouth daily.    . meloxicam (MOBIC) 15 MG tablet Take 15 mg by mouth daily.    . metFORMIN (GLUCOPHAGE) 500 MG tablet TAKE ONE TABLET BY MOUTH EVERY MORNING and TAKE ONE TABLET BY MOUTH EVERY EVENING   . methotrexate (RHEUMATREX) 2.5 MG tablet Take 20 mg by mouth every Friday.    . metolazone (ZAROXOLYN) 2.5 MG tablet TAKE 1 TABLET BY MOUTH AS NEEDED FOR WEIGHT GAIN OF 5 POUNDS WITHIN 3 DAYS AS DIRECTED needs appt for further refills   . montelukast (SINGULAIR) 10 MG tablet TAKE ONE TABLET BY MOUTH EVERY MORNING   . Multiple Vitamin (MULTIVITAMIN WITH MINERALS) TABS Take 1 tablet by mouth every morning.    . ondansetron (ZOFRAN-ODT) 4 MG disintegrating tablet DISSOLVE ONE TABLET UNDER THE TONGUE EVERY TWELVE HOURS AS NEEDED   . PARoxetine (PAXIL) 10 MG tablet TAKE ONE TABLET BY MOUTH EVERY MORNING   . potassium chloride SA (KLOR-CON) 20 MEQ tablet TAKE TWO TABLETS BY MOUTH EVERY MORNING   . PROAIR HFA 108 (90 Base) MCG/ACT inhaler INHALE 2 PUFFS BY MOUTH EVERY DAY AS NEEDED FOR SHORTNESS OF BREATH   . spironolactone (ALDACTONE) 25 MG tablet TAKE ONE TABLET BY MOUTH EVERY MORNING and TAKE ONE TABLET BY MOUTH EVERY EVENING   . torsemide (DEMADEX) 20 MG tablet TAKE ONE TABLET BY MOUTH weekly on monday, wednesday, and Friday Please call for an appointment for future refills   . Upadacitinib ER (RINVOQ) 15 MG TB24 Take 15 mg by mouth daily. 01/02/2021: [name] - Rheumatology- Rheumatoid Arthritis and Fibromyalgia   . Vitamin D, Ergocalciferol, (DRISDOL) 1.25 MG (50000 UNIT) CAPS capsule TAKE ONE CAPSULE BY MOUTH ONCE WEEKLY ON TUESDAY and TAKE ONE CAPSULE ONCE WEEKLY ON FRIDAY    No facility-administered encounter medications on file as of 03/21/2021.    Patient Active Problem List   Diagnosis Date Noted  . 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 History  of COVID-19 04/12/2020  . COVID-19 virus infection 03/07/2020  . Lower abdominal pain 09/07/2019  . Chronic systolic (congestive) heart failure ([place]) 01/28/2018  . Cough 01/19/2018  . Pulmonary infiltrates on CXR 01/19/2018  . Migraines 10/08/2017  . Sleep apnea with use of continuous positive airway pressure (CPAP) 09/24/2017  . Congestive heart failure (CHF) ([place]) 10/09/2016  . Rheumatoid arthritis ([place]) 10/09/2016  . Asthma 10/09/2016  . Chronic pain 10/09/2016  . Heart failure ([place]) 10/09/2016  . Congestive heart failure ([place])   . Lupus (systemic lupus erythematosus) ([place])   . Diabetes mellitus with complication ([place])   . Anxiety state   . GERD (gastroesophageal reflux disease) 12/28/2015  . Essential hypertension 12/26/2015  . Type 2 diabetes mellitus ([place]) 12/26/2015  . Chronic systolic heart failure ([place]) 08/18/2014  . Cardiomyopathy, dilated ([place]) 01/27/2014  . Shortness of breath 01/26/2014  . SLE (systemic lupus erythematosus) ([place]) 01/26/2014    Conditions to be addressed/monitored:CHF, DM II, HTN, SLE, RA, Asthma  Care Plan : Depression (Adult)  Updates made by [name], RN since 03/27/2021 12:00 AM    Problem: Symptoms (Depression)   Priority: High    Long-Range Goal: Symptoms Monitored and Managed   Start Date: 03/21/2021  Expected End Date: 09/21/2021  This Visit's Progress: On track  Priority: High  Note:   Current Barriers:   Ineffective Self Health Maintenance  Grief over recent loss of close friend  Clinical Goal(s):  Marland Kitchen Collaboration with [name], MD regarding development and update of comprehensive plan of care as evidenced by provider attestation and co-signature . Inter-disciplinary care team collaboration (see longitudinal plan of care)  patient will work with care management team to address care coordination and chronic disease management needs related to Disease Management  Educational Needs  Care Coordination  Medication  Management and Education  Psychosocial Support   Interventions:   Evaluation of current treatment plan related to Depression, self-management and patient's adherence to plan as established by provider.  Collaboration with [name], MD regarding development and update of comprehensive plan of care as evidenced by provider attestation       and co-signature  Inter-disciplinary care team collaboration (see longitudinal plan of care)  Determined patient continues to suffer from depression following the recent loss of her best friend and roommate  Determined patient notified her PCP of her ongoing depression and will f/u with PCP provider [name] FNP on 03/27/21 @11 :00 AM  Determined patient is also experiencing other life events adding to her stress and depression concerning several family members including her son and mother  Discussed patient's aunt purchased her a new puppy for emotional support that has brought her some happiness and joy  Discussed patient's wish to have her dog registered as an emotional support animal so she may take her pet with her to most all public places as desired  Discussed having patient work with the embedded BSW to explore resources on how to get her dog registered with the state  Collaborated with embedded [place] regarding patient's request and determined Tillie Rung will follow up with the patient  Discussed plans with patient for ongoing care management follow up and provided patient with direct contact information for care management team Self Care Activities:  . Continue to keep all scheduled follow up appointments . Take medications as directed  . Let your healthcare team know if you are unable to take your medications . Call your pharmacy for refills at least 7 days prior to running out of medication . Work with the embedded BSW regarding resources to help you learn how to register your pet as an emotional support animal Patient  Goals: - to start grief counseling and have my puppy registered as an emotional support dog  Follow Up Plan: Telephone follow up appointment with care management team member scheduled for: 04/24/21     Plan:Telephone follow up appointment with care management team member scheduled for:  04/24/21  Barb Merino, RN, BSN, CCM Care Management Coordinator [place] [facility]  Direct Phone: [phone]

## 2021-03-29 ENCOUNTER — Telehealth: Payer: Self-pay

## 2021-03-29 NOTE — Telephone Encounter (Signed)
I called pt to reschedule her appt she missed on 03/27/21. Left her a vm to call the office [facility]

## 2021-04-02 ENCOUNTER — Ambulatory Visit (INDEPENDENT_AMBULATORY_CARE_PROVIDER_SITE_OTHER): Payer: Medicare Other

## 2021-04-02 DIAGNOSIS — I5022 Chronic systolic (congestive) heart failure: Secondary | ICD-10-CM

## 2021-04-02 DIAGNOSIS — Z95810 Presence of automatic (implantable) cardiac defibrillator: Secondary | ICD-10-CM

## 2021-04-03 ENCOUNTER — Telehealth: Payer: Medicare Other

## 2021-04-03 ENCOUNTER — Telehealth: Payer: Self-pay

## 2021-04-03 NOTE — Progress Notes (Signed)
EPIC Encounter for ICM Monitoring  Patient Name: Christine Mcdowell is a 50 y.o. female Date: 04/03/2021 Primary Care Physican: [name], MD Primary Cardiologist:Bensimhon Electrophysiologist:Taylor LastWeight:184lbs  Transmission reviewed.  4/10/2022[employer] Heart Failure Index1suggestingnormal fluid levels.  Prescribed:  Torsemide20 mg take1tablet (20 mg total)Mon, Wed and Friday.Per Dr Clayborne Dana 08/21/2020 noteCan take extra torsemide or metolazone prn as needed  Potassium 20 mEq take2tabletsdaily.  Spironolactone 25 mg take 1 tablet daily.  Metolazone 2.5 mg takeone tablet as needed for weight gain of 5 lbs within 3 days.   Labs: 08/30/2020 Creatinine 1.02, BUN 16, Potassium 3.7, Sodium 143, GFR 64-74 06/05/2020 Creatinine 1.93, BUN 28, Potassium 3.7, Sodium 139, GFR 29-34 05/01/2020 Creatinine0.81, BUN14, Potassium3.3, Sodium143, GFR>60 04/19/2020 Creatinine0.81, BUN11, Potassium3.2, [id number], GFR>60  01/31/2020 Creatinine0.88, BUN13, Potassium3.4, Sodium141, GFR>60 A complete set of results can be found in Results Review.  Recommendations:No changes.   Follow-up plan: ICM clinic phone appointment on6/12/2020. 91 day device clinic remote transmission6/07/2021.   EP/Cardiology next office visit: Recall for9/10/2022withDr. Lovena Le   Copy of ICM check sent to Dr. Lovena Le.  3 Month Trend    8 Day Data Trend        Christine Billings, RN 04/03/2021 12:54 PM

## 2021-04-03 NOTE — Telephone Encounter (Signed)
  Chronic Care Management   Outreach Note  04/03/2021 Name: Christine Mcdowell MRN: [id number] DOB: 10-Sep-1971  Referred by: [name], MD Reason for referral : Chronic Care Management (Unsuccessful call)   An unsuccessful telephone outreach was attempted today. The patient was referred to the case management team for assistance with care management and care coordination.   Follow Up Plan: A HIPAA compliant phone message was left for the patient providing contact information and requesting a return call.  The care management team will reach out to the patient again over the next 21 days.   Daneen Schick, BSW, CDP Social Worker, Certified Dementia Practitioner [place] [facility] [place] [facility] [phone]

## 2021-04-04 ENCOUNTER — Other Ambulatory Visit (HOSPITAL_COMMUNITY): Payer: Self-pay | Admitting: Internal Medicine

## 2021-04-04 ENCOUNTER — Other Ambulatory Visit: Payer: Self-pay | Admitting: Nurse Practitioner

## 2021-04-05 ENCOUNTER — Telehealth: Payer: Self-pay

## 2021-04-05 NOTE — Chronic Care Management (AMB) (Addendum)
Chronic Care Management Pharmacy Assistant   Name: Kenadie Royce  MRN: [id number] DOB: Apr 09, 1971  Reason for Encounter: Medication Review/ Medication Coordination Call   Recent office visits:  03/21/2021- Barb Merino, RN (CCM) 03/23/2021- Daneen Schick (CCM)  Recent consult visits:  None  Hospital visits:  None in previous 6 months   Reviewed chart for medication changes ahead of medication coordination call.  Medication changes indicated- 03/20/2021- Prednisone 10 mg- Take 2 tablets daily x 5 days, then 1/5 tablet daily x 5 days, then 1 tablet daily x 5 days and 0.5 tablets daily for 5 days sent in by Dr Rogers Blocker (Rheumatology).  BP Readings from Last 3 Encounters:  04/12/21 (!) 154/91  04/11/21 (!) 150/92  11/29/20 118/76    Lab Results  Component Value Date   HGBA1C 6.5 (H) 11/29/2020     Patient obtains medications through Vials  30 Days   Last adherence delivery included: Torsemide 20 mg- one tablet Monday, Wednesday and Friday Humalog Kwikpen- Inject 17 units sq three times daily with meals  Carvedilol 25 mg- One tablet twice a day  Hydralazine 25 mg- 3 tablets three times daily  Gvoke Hypopen- Inject 1 mg into skin as needed Folic Acid 1 mg- 1 tablet daily Potassium CL ER 20 Meq- 2 tablets by mouth daily Paroxetine 10 mg- 1 tablet daily  Atorvastatin 10 mg- one tablet daily  Entresto 97 mg/103 mg- one tablet twice a day Trazodone 50 mg- 1 tablet at bedtime as needed for sleep (patient restarted medication) Diclofenac 1% gel- apply small amount to joints twice daily as needed Hydroxychloroquine 200 mg- one tablet by mouth twice daily  Ondansetron 4 mg- dissolve one tablet under tongue every twelve hours as needed for nausea. VIAL (Dissolvable tablets please) Spironolactone 25 mg- one tablet twice a day  Metformin 500 mg- one tablet twice a day Montelukast 10 mg- one tablet daily Methotrexate Sodium 2.5 mg- eight tablets by mouth once weekly on   Friday Meloxicam 15 mg- One tablet by mouth daily  Vitamin D 50,000 units- one capsules two times a week, Tuesday and Friday Budes/formot Aer 80-4.5 mcg- inhale 2 puffs into lungs in the morning and bedtime.  Patient declined the following medications last month: Metolazone 2.5 mg- 1 tablet by mouth as needed for weight gain over 5 lbs due to using PRN weight gain. Patient still has #15 on hand. Onetouch Verio test strips- Use as directed once daily Onetouch Del Mis Plus 33 G- Use as directed once daily.                         Due to using Dexcom CGM Tresiba FlexPen due to not being covered by insurance- discontinued Novolog due to using an adequate supply, uses as a sliding scale. Tramadol 50 mg- 1 tablet daily due- Patient is no longer taking medication. Dexcom G6 Sensor Device- Patient receives through [employer]. Prednisone 5 mg 1.5 tablet daily due to completing dose. Prednisone 10 mg- Due to receiving a 20 day supply on 02/15/2021 from [place]. OTC- Centrum Silver Multivitamin and Vitamin B due to getting from [facility] with discount card. TruePlus Pen Needles 32 G- use as directed, due to receiving a 90 day supply on 02/12/2021. Farxiga 10 mg- 1 tablet daily- due to patient discontinuing medication, never took due to hx of side effects- chronic yeast infection.  Patient is due for next adherence delivery on: 04/12/2021. Called patient and reviewed medications and  coordinated delivery.  This delivery to include: Torsemide 20 mg- one tablet Monday, Wednesday and Friday Humalog Kwikpen- Inject 17 units sq three times daily with meals  Carvedilol 25 mg- One tablet twice a day  Hydralazine 25 mg- 3 tablets three times daily  Gvoke Hypopen- Inject 1 mg into skin as needed Folic Acid 1 mg- 1 tablet daily Potassium CL ER 20 Meq- 2 tablets by mouth daily Paroxetine 10 mg- 1 tablet daily  Atorvastatin 10 mg- one tablet daily  Entresto 97 mg/103 mg- one tablet twice a day Trazodone  50 mg- 1 tablet at bedtime as needed for sleep (patient restarted medication) Diclofenac 1% gel- apply small amount to joints twice daily as needed Hydroxychloroquine 200 mg- one tablet by mouth twice daily  Spironolactone 25 mg- one tablet twice a day  Metformin 500 mg- one tablet twice a day Montelukast 10 mg- one tablet daily Methotrexate Sodium 2.5 mg- eight tablets by mouth once weekly on  Friday Meloxicam 15 mg- One tablet by mouth daily  Vitamin D 50,000 units- one capsules two times a week, Tuesday and Friday Budes/formot Aer 80-4.5 mcg- inhale 2 puffs into lungs in the morning and bedtime. Benzonatate 100 mg - 1 capsule every 6 hours as needed for cough Fluticasone Nasal Spray- 1 spray each nostril as needed   No short fill or acute fill needed.  Patient declined the following medications: Metolazone 2.5 mg- 1 tablet by mouth as needed for weight gain over 5 lbs due to using PRN weight gain. Patient still has #15 on hand. Onetouch Verio test strips- Use as directed once daily Onetouch Del Mis Plus 33 G- Use as directed once daily.                         Due to using Dexcom CGM Tresiba FlexPen due to not being covered by insurance- discontinued Novolog due to using an adequate supply, uses as a sliding scale. Tramadol 50 mg- 1 tablet daily due- Patient is no longer taking medication. Dexcom G6 Sensor Device- Patient receives through [employer]. Prednisone 5 mg 1.5 tablet daily due to completing dose. Prednisone 10 mg- Due to receiving a 20 day supply on 02/15/2021 from [place]. OTC- Centrum Silver Multivitamin and Vitamin B due to getting from [facility] with discount card. TruePlus Pen Needles 32 G- use as directed, due to receiving a 90 day supply on 02/12/2021. Farxiga 10 mg- 1 tablet daily- due to patient discontinuing medication, never took due to hx of side effects- chronic yeast infection. Ondansetron 4 mg- dissolve one tablet under tongue every twelve hours as  needed for nausea. VIAL (Dissolvable tablets please)  Patient needs refills for Entresto and Meloxicam. 04/11/2021- Called Dr Carter Kitten- Cardiolgy and requested refill of Entresto and Dr Shelby Mattocks office- Rheumatology and requested refills of Meloxicam   Confirmed delivery date of 04/12/2021 , advised patient that pharmacy will contact them the morning of delivery.  Medications: Outpatient Encounter Medications as of 04/05/2021  Medication Sig Note   albuterol (ACCUNEB) 1.25 MG/3ML nebulizer solution Take 3 mLs (1.25 mg total) by nebulization every 6 (six) hours as needed for wheezing. (Patient taking differently: Take 1 ampule by nebulization 3 (three) times daily as needed for wheezing.)    aspirin 81 MG chewable tablet Chew 162 mg by mouth daily. Taking 2 tablets    budesonide-formoterol (SYMBICORT) 80-4.5 MCG/ACT inhaler Inhale 2 puffs into the lungs in the morning and at bedtime.    carvedilol (  COREG) 25 MG tablet TAKE ONE TABLET BY MOUTH EVERY MORNING and TAKE ONE TABLET BY MOUTH EVERY EVENING    Continuous Blood Gluc Receiver (DEXCOM G6 RECEIVER) DEVI 1 Device by Does not apply route 3 (three) times daily before meals.    Continuous Blood Gluc Sensor (DEXCOM G6 SENSOR) MISC Inject 1 each into the skin 3 (three) times daily.    Continuous Blood Gluc Transmit (DEXCOM G6 TRANSMITTER) MISC 1 Device by Does not apply route 3 (three) times daily before meals.    folic acid (FOLVITE) 1 MG tablet TAKE 1 TABLET BY MOUTH EVERY MORNING    Glucagon (GVOKE HYPOPEN 2-PACK) 1 MG/0.2ML SOAJ Inject 1 mg into the skin as needed.    hydrALAZINE (APRESOLINE) 25 MG tablet Take 3 tablets (75 mg total) by mouth 3 (three) times daily.    hydroxychloroquine (PLAQUENIL) 200 MG tablet Take 1 tablet (200 mg total) by mouth 2 (two) times daily.    insulin lispro (HUMALOG KWIKPEN) 100 UNIT/ML KwikPen Use according to sliding scale    Insulin Pen Needle (NOVOFINE PLUS PEN NEEDLE) 32G X 4 MM MISC Use with insulin pens dx  code e11.65    Magnesium 400 MG TABS Take 1 tablet by mouth daily.     meloxicam (MOBIC) 15 MG tablet Take 15 mg by mouth daily.     metFORMIN (GLUCOPHAGE) 500 MG tablet TAKE ONE TABLET BY MOUTH TWICE DAILY    methotrexate (RHEUMATREX) 2.5 MG tablet Take 20 mg by mouth every Friday.     metolazone (ZAROXOLYN) 2.5 MG tablet TAKE 1 TABLET BY MOUTH AS NEEDED FOR WEIGHT GAIN OF 5 POUNDS WITHIN 3 DAYS AS DIRECTED needs appt for further refills    montelukast (SINGULAIR) 10 MG tablet TAKE ONE TABLET BY MOUTH EVERY MORNING    Multiple Vitamin (MULTIVITAMIN WITH MINERALS) TABS Take 1 tablet by mouth every morning.     PROAIR HFA 108 (90 Base) MCG/ACT inhaler INHALE 2 PUFFS BY MOUTH EVERY DAY AS NEEDED FOR SHORTNESS OF BREATH    Vitamin D, Ergocalciferol, (DRISDOL) 1.25 MG (50000 UNIT) CAPS capsule TAKE ONE CAPSULE BY MOUTH ONCE WEEKLY ON TUESDAY and TAKE ONE CAPSULE ONCE WEEKLY ON FRIDAY    [DISCONTINUED] atorvastatin (LIPITOR) 10 MG tablet Take 1 tablet (10 mg total) by mouth daily.    [DISCONTINUED] benzonatate (TESSALON PERLES) 100 MG capsule Take 1 capsule (100 mg total) by mouth every 6 (six) hours as needed for cough.    [DISCONTINUED] dapagliflozin propanediol (FARXIGA) 10 MG TABS tablet Take 1 tablet (10 mg total) by mouth daily before breakfast.    [DISCONTINUED] ENTRESTO 97-103 MG TAKE ONE TABLET BY MOUTH EVERY MORNING and TAKE ONE TABLET BY MOUTH EVERY EVENING    [DISCONTINUED] ondansetron (ZOFRAN-ODT) 4 MG disintegrating tablet DISSOLVE ONE TABLET UNDER THE TONGUE EVERY TWELVE HOURS AS NEEDED    [DISCONTINUED] PARoxetine (PAXIL) 10 MG tablet TAKE ONE TABLET BY MOUTH EVERY MORNING    [DISCONTINUED] potassium chloride SA (KLOR-CON) 20 MEQ tablet TAKE TWO TABLETS BY MOUTH EVERY MORNING    [DISCONTINUED] spironolactone (ALDACTONE) 25 MG tablet TAKE ONE TABLET BY MOUTH EVERY MORNING and TAKE ONE TABLET BY MOUTH EVERY EVENING    [DISCONTINUED] torsemide (DEMADEX) 20 MG tablet TAKE ONE TABLET BY MOUTH  weekly on monday, wednesday, and Friday Please call for an appointment for future refills    [DISCONTINUED] Upadacitinib ER (RINVOQ) 15 MG TB24 Take 15 mg by mouth daily. 01/02/2021: [name] - Rheumatology- Rheumatoid Arthritis and Fibromyalgia  No facility-administered encounter medications on file as of 04/05/2021.     Star Rating Drugs: Atorvastatin 10 mg- Last filled 04/11/2021 for 30 day supply at [facility]. Farxiga 10 mg- Last filled 02/23/2021 for 30 day supply at [facility]. Entresto 97/103 mg- Last filled 04/11/2021 for 30 day supply at [facility] Telmisartan 80 mg- Last filled 08/12/2018 for 30 day supply at [facility] Metformin 500 mg- Last filled 04/11/2021 for 30 day supply at [facility]  Notes: Patient reported having EMS out to her home Saturday due to an Asthma attack she could control with her inhalers or nebulizer treatments. She was taking using her Proair inhaler frequently and Albuterol nebulizer treatments twice a day. She believes this started from her allergies/pollen. Her allergy medication Singular was not helping also. Patient even tried otc Zyrtec and Benadryl with no relief, she was having shortness of breath, O2 dropped to 69%. EMS triaged at home, gave her a Duoneb treatment and brought her O2 level back up to 100%. Patient states she felt much better after they left and was wondering if her PCP would prescribe Duoneb treatment (albuterol sulfate + ipratropium bromide) which helped her control her asthma symptoms and would also like to request a Pulmonology referral. Patient also requested the need for new Nebulizer machine supplies- mask, filter, tubing. Patient's machine is from [employer] [id number], MFR#[id number]. Patient also requested vials for medication delivery- regular lids, no childproof or easy open tops due to medications falling out of purse. Patient also requested Ladona Ridgel, patient aware request  sent to PCP office. Patient is checking blood pressures and blood sugars, she noticed her blood pressures were doing great 120/80, 106/88, but during her asthma attack BP went up to 178/101. Patient also  Mentioned elevated blood sugars at night 380-200-90-100, she did mention eating fried brown rice that week. Patient is using medication as prescribed.  Orlando Penner, CPP notified.  04/12/2021- Spoke with PCP office regarding follow up on request of medications and referrals patient would like to have, patient was seen by [place] ED  and diagnosed with Covid. Patient will finish treatment before any other medications to be added.  Orlando Penner, CPP notified.   SIG: Pattricia Boss, [place] [phone]  I have reviewed the care management and care coordination activities outlined in this encounter and I am certifying that I agree with the content of this note. No further action required.  Mayford Knife, RPH 2:52 PM

## 2021-04-11 ENCOUNTER — Encounter: Payer: Self-pay | Admitting: Nurse Practitioner

## 2021-04-11 ENCOUNTER — Telehealth (INDEPENDENT_AMBULATORY_CARE_PROVIDER_SITE_OTHER): Payer: Medicare Other | Admitting: Nurse Practitioner

## 2021-04-11 ENCOUNTER — Other Ambulatory Visit: Payer: Self-pay | Admitting: Nurse Practitioner

## 2021-04-11 ENCOUNTER — Telehealth: Payer: Self-pay

## 2021-04-11 VITALS — BP 150/92 | HR 100 | Temp 99.1°F | Ht 65.0 in | Wt 184.2 lb

## 2021-04-11 DIAGNOSIS — U071 COVID-19: Secondary | ICD-10-CM | POA: Diagnosis not present

## 2021-04-11 DIAGNOSIS — E1165 Type 2 diabetes mellitus with hyperglycemia: Secondary | ICD-10-CM | POA: Diagnosis not present

## 2021-04-11 MED ORDER — ATORVASTATIN CALCIUM 10 MG PO TABS: 10.0000 mg | ORAL_TABLET | Freq: Every day | ORAL | 2 refills | Status: DC

## 2021-04-11 MED ORDER — AMOXICILLIN-POT CLAVULANATE 875-125 MG PO TABS: 1.0000 | ORAL_TABLET | Freq: Two times a day (BID) | ORAL | 0 refills | Status: AC

## 2021-04-11 MED ORDER — BENZONATATE 100 MG PO CAPS: 100.0000 mg | ORAL_CAPSULE | Freq: Four times a day (QID) | ORAL | 1 refills | Status: DC | PRN

## 2021-04-11 NOTE — Telephone Encounter (Signed)
..   Pt understands that although there may be some limitations with this type of visit, we will take all precautions to reduce any security or privacy concerns.  Pt understands that this will be treated like an in office visit and we will file with pt's insurance, and there may be a patient responsible charge related to this service. ? ?

## 2021-04-11 NOTE — Progress Notes (Signed)
Virtual Visit via video call    This visit type was conducted due to national recommendations for restrictions regarding the COVID-19 Pandemic (e.g. social distancing) in an effort to limit this patient's exposure and mitigate transmission in our community.  Due to her co-morbid illnesses, this patient is at least at moderate risk for complications without adequate follow up.  This format is felt to be most appropriate for this patient at this time.  All issues noted in this document were discussed and addressed.  A limited physical exam was performed with this format.    This visit type was conducted due to national recommendations for restrictions regarding the COVID-19 Pandemic (e.g. social distancing) in an effort to limit this patient's exposure and mitigate transmission in our community.  Patients identity confirmed using two different identifiers.  This format is felt to be most appropriate for this patient at this time.  All issues noted in this document were discussed and addressed.  No physical exam was performed (except for noted visual exam findings with Video Visits).    Date:  04/11/2021   ID:  Christine Mcdowell, DOB 07-31-1971, MRN [id number]  Patient Location:  Home   Provider location:   Office    Chief Complaint:  Tested positive for COVID    History of Present Illness:    Christine Mcdowell is a 50 y.o. female who presents via video conferencing for a telehealth visit today.     The patient does have symptoms concerning for COVID-19 infection (fever, chills, cough, or new shortness of breath).   She has a really bad congestion and cough. Hard time breathing and has been using her albeterol. The last time she had Covid, the remote health came but they cant come in anymore. She tested for Covid today.Her chest hurts. She has a really bad headache.Low grade fever. She has a history of lupus, CHF, HTN, diabetes, RA. She had to call the EMT this past weekend, and       Past Medical History:  Diagnosis Date  . AICD (automatic cardioverter/defibrillator) present 01/28/2018  . Anemia   . Anginal pain ([place])   . Asthma   . Cervical cancer ([place])    cervical 1996  . CHF (congestive heart failure) ([place])   . 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 Discoid lupus   . Fibromyalgia   . History of blood transfusion "several"   "related to anemia; had some w/hysterectomy also"  . Hx of cardiovascular stress test    ETT-Myoview (9/15):  No ischemia, EF 52%; NORMAL  . Hx of echocardiogram    Echo (9/15):  EF 50-55%, ant HK, Gr 1 DD, mild MR, mild LAE, no effusion  . Hypertension   . Iron deficiency anemia    h/o iron transfusions  . Lupus (systemic lupus erythematosus) ([place])   . Migraine    "a few/year" (07/03/2016)  . Pneumonia 12/2015  . RA (rheumatoid arthritis) ([place])    "all over" (07/03/2016)  . Sickle cell trait ([place])   . Stroke ([place]) 2014 X 1; 2015 X 2; 2016 X 1;    "right side of face more relaxed than the other; rare speech hesitation" (07/03/2016)  . Vaginal Pap smear, abnormal    ASCUS; HPV   Past Surgical History:  Procedure Laterality Date  . ABDOMINAL HYSTERECTOMY  2009  . ABDOMINAL WOUND DEHISCENCE  2009  . BUNIONECTOMY Left 06/15/2020  . CARDIAC CATHETERIZATION N/A 10/11/2016   Procedure: Right/Left Heart  Cath and Coronary Angiography;  Surgeon: Jolaine Artist, MD;  Location: [facility];  Service: Cardiovascular;  Laterality: N/A;  . DILATION AND CURETTAGE OF UTERUS  1991  . HEMATOMA EVACUATION  2009   abdomen  . ICD IMPLANT  01/28/2018  . ICD IMPLANT N/A 01/28/2018   Procedure: ICD IMPLANT;  Surgeon: Evans Lance, MD;  Location: [facility];  Service: Cardiovascular;  Laterality: N/A;  . INCISE AND DRAIN ABCESS  2009 X 2   "abdomen after hysterectomy"  . KNEE ARTHROSCOPY Right 1997  . KNEE SURGERY Right   . TUBAL LIGATION  1996     Current Meds  Medication Sig  .  amoxicillin-clavulanate (AUGMENTIN) 875-125 MG tablet Take 1 tablet by mouth 2 (two) times daily for 7 days.     Allergies:   Hydrocodone-acetaminophen, Hydromorphone, Iodinated diagnostic agents, Other, Erythromycin, Latex, Tape, and Mircette [desogestrel-ethinyl estradiol]   Social History   Tobacco Use  . Smoking status: Never Smoker  . Smokeless tobacco: Never Used  Vaping Use  . Vaping Use: Never used  Substance Use Topics  . Alcohol use: No  . Drug use: No     Family Hx: The patient's family history includes Allergies in her father and mother; Arthritis in her mother; Asthma in her maternal grandmother; Cancer in her paternal grandfather; Cushing syndrome in her father; Dementia in her paternal grandmother; Depression in her father; Diabetes in her maternal grandmother; Drug abuse in her mother; Heart attack in her father and maternal grandfather; Heart murmur in her mother; Hypertension in her maternal grandmother. There is no history of Breast cancer.  ROS:   Please see the history of present illness.    Review of Systems  Constitutional: Positive for fever and malaise/fatigue.  HENT: Positive for congestion. Negative for ear pain.   Respiratory: Positive for cough and wheezing.   Cardiovascular: Negative for chest pain.  Gastrointestinal: Negative for constipation, diarrhea, nausea and vomiting.  Musculoskeletal: Positive for myalgias.  Skin: Negative for rash.  Neurological: Positive for headaches.    All other systems reviewed and are negative.   Labs/Other Tests and Data Reviewed:    Recent Labs: 04/12/2020: TSH 0.652 04/19/2020: B Natriuretic Peptide 71.3 08/30/2020: ALT 14; BUN 16; Creatinine, Ser 1.02; Hemoglobin 11.2; Platelets 238; Potassium 3.7; Sodium 143   Recent Lipid Panel Lab Results  Component Value Date/Time   CHOL 196 08/30/2020 05:29 PM   TRIG 227 (H) 08/30/2020 05:29 PM   HDL 47 08/30/2020 05:29 PM   CHOLHDL 4.2 08/30/2020 05:29 PM   LDLCALC  110 (H) 08/30/2020 05:29 PM    Wt Readings from Last 3 Encounters:  04/11/21 184 lb 3.2 oz (83.6 kg)  01/25/21 190 lb (86.2 kg)  11/29/20 191 lb (86.6 kg)     Exam:    Vital Signs:  BP (!) 150/92   Pulse 100   Temp 99.1 F (37.3 C) (Oral)   Ht 5\' 5"  (1.651 m)   Wt 184 lb 3.2 oz (83.6 kg)   SpO2 98%   BMI 30.65 kg/m     Physical Exam Vitals and nursing note reviewed.  Constitutional:      Appearance: She is ill-appearing.  HENT:     Head: Normocephalic and atraumatic.  Pulmonary:     Effort: Pulmonary effort is normal.  Neurological:     Mental Status: She is alert and oriented to person, place, and time.  Psychiatric:        Mood and Affect:  Affect normal.     ASSESSMENT & PLAN:     1. COVID-19 virus infection -This is the 2nd time the patient is infected with COVID. She is vaccinated and boostered.  I have put in a urgent referral for infusion for her to have  monoclonial antibodies. I have also advised the patient to go to the ED for treatment due to her history of autoimmune disease and other commodities, however the patient refused at this time stating she will get more sick if she goes there.  Will send in a Rx for Augmentin as she is allergic to erythromycin. Will also send in some tessalon pearls to help with cough.  For her elevated BP, I have advised her to go ahead and take her 2nd dose of hydralazine. I have advised her to continue to take her albuterol nebulizer. And to recheck her BP and let us know if it is still elevated.  I have advised the patient to get emergency care or go the emergency room if she feels any short of breath, experiences chest pain, leg pain or get any worse.     COVID-19 Education: The signs and symptoms of COVID-19 were discussed with the patient and how to seek care for testing (follow up with PCP or arrange E-visit).  The importance of social distancing was discussed today.  Patient Risk:   After full review of this patients  clinical status, I feel that they are at least moderate risk at this time.  Time:   Today, I have spent 20 minutes/ seconds with the patient with telehealth technology discussing above diagnoses.     Medication Adjustments/Labs and Tests Ordered: Current medicines are reviewed at length with the patient today.  Concerns regarding medicines are outlined above.   Tests Ordered: Orders Placed This Encounter  Procedures  . Ambulatory referral for Covid Treatment    Medication Changes: Meds ordered this encounter  Medications  . amoxicillin-clavulanate (AUGMENTIN) 875-125 MG tablet    Sig: Take 1 tablet by mouth 2 (two) times daily for 7 days.    Dispense:  14 tablet    Refill:  0    Disposition:  Follow up if she gets worse   Signed, Bary Castilla, DNP

## 2021-04-11 NOTE — Telephone Encounter (Signed)
Can you call to see if she can get the infusion?

## 2021-04-11 NOTE — Patient Instructions (Signed)

## 2021-04-12 ENCOUNTER — Other Ambulatory Visit (HOSPITAL_COMMUNITY): Payer: Self-pay

## 2021-04-12 ENCOUNTER — Other Ambulatory Visit: Payer: Self-pay

## 2021-04-12 ENCOUNTER — Other Ambulatory Visit (HOSPITAL_BASED_OUTPATIENT_CLINIC_OR_DEPARTMENT_OTHER): Payer: Self-pay

## 2021-04-12 ENCOUNTER — Telehealth: Payer: Self-pay | Admitting: Family

## 2021-04-12 ENCOUNTER — Encounter (HOSPITAL_BASED_OUTPATIENT_CLINIC_OR_DEPARTMENT_OTHER): Payer: Self-pay | Admitting: *Deleted

## 2021-04-12 ENCOUNTER — Emergency Department (HOSPITAL_BASED_OUTPATIENT_CLINIC_OR_DEPARTMENT_OTHER)
Admission: EM | Admit: 2021-04-12 | Discharge: 2021-04-12 | Disposition: A | Discharge status: Home / Self Care | Payer: Medicare Other | Attending: Emergency Medicine | Admitting: Emergency Medicine

## 2021-04-12 ENCOUNTER — Emergency Department (HOSPITAL_BASED_OUTPATIENT_CLINIC_OR_DEPARTMENT_OTHER): Payer: Medicare Other

## 2021-04-12 DIAGNOSIS — Z91040 Latex allergy status: Secondary | ICD-10-CM | POA: Insufficient documentation

## 2021-04-12 DIAGNOSIS — Z794 Long term (current) use of insulin: Secondary | ICD-10-CM | POA: Diagnosis not present

## 2021-04-12 DIAGNOSIS — Z79899 Other long term (current) drug therapy: Secondary | ICD-10-CM | POA: Insufficient documentation

## 2021-04-12 DIAGNOSIS — J45909 Unspecified asthma, uncomplicated: Secondary | ICD-10-CM | POA: Insufficient documentation

## 2021-04-12 DIAGNOSIS — R0602 Shortness of breath: Secondary | ICD-10-CM | POA: Diagnosis not present

## 2021-04-12 DIAGNOSIS — E119 Type 2 diabetes mellitus without complications: Secondary | ICD-10-CM | POA: Insufficient documentation

## 2021-04-12 DIAGNOSIS — Z8616 Personal history of COVID-19: Secondary | ICD-10-CM | POA: Insufficient documentation

## 2021-04-12 DIAGNOSIS — Z7984 Long term (current) use of oral hypoglycemic drugs: Secondary | ICD-10-CM | POA: Diagnosis not present

## 2021-04-12 DIAGNOSIS — U071 COVID-19: Secondary | ICD-10-CM | POA: Insufficient documentation

## 2021-04-12 DIAGNOSIS — I5022 Chronic systolic (congestive) heart failure: Secondary | ICD-10-CM | POA: Insufficient documentation

## 2021-04-12 DIAGNOSIS — J9811 Atelectasis: Secondary | ICD-10-CM | POA: Diagnosis not present

## 2021-04-12 DIAGNOSIS — I110 Hypertensive heart disease with heart failure: Secondary | ICD-10-CM | POA: Diagnosis not present

## 2021-04-12 DIAGNOSIS — I517 Cardiomegaly: Secondary | ICD-10-CM | POA: Diagnosis not present

## 2021-04-12 DIAGNOSIS — J208 Acute bronchitis due to other specified organisms: Secondary | ICD-10-CM | POA: Diagnosis not present

## 2021-04-12 DIAGNOSIS — R059 Cough, unspecified: Secondary | ICD-10-CM | POA: Diagnosis not present

## 2021-04-12 DIAGNOSIS — Z8541 Personal history of malignant neoplasm of cervix uteri: Secondary | ICD-10-CM | POA: Insufficient documentation

## 2021-04-12 DIAGNOSIS — Z7951 Long term (current) use of inhaled steroids: Secondary | ICD-10-CM | POA: Diagnosis not present

## 2021-04-12 DIAGNOSIS — Z7982 Long term (current) use of aspirin: Secondary | ICD-10-CM | POA: Insufficient documentation

## 2021-04-12 DIAGNOSIS — R0600 Dyspnea, unspecified: Secondary | ICD-10-CM | POA: Diagnosis not present

## 2021-04-12 DIAGNOSIS — J209 Acute bronchitis, unspecified: Secondary | ICD-10-CM | POA: Diagnosis not present

## 2021-04-12 LAB — CBC WITH DIFFERENTIAL/PLATELET
Abs Immature Granulocytes: 0.04 10*3/uL (ref 0.00–0.07)
Basophils Absolute: 0 10*3/uL (ref 0.0–0.1)
Basophils Relative: 0 %
Eosinophils Absolute: 0.1 10*3/uL (ref 0.0–0.5)
Eosinophils Relative: 1 %
HCT: 34.2 % — ABNORMAL LOW (ref 36.0–46.0)
Hemoglobin: 10.7 g/dL — ABNORMAL LOW (ref 12.0–15.0)
Immature Granulocytes: 1 %
Lymphocytes Relative: 18 %
Lymphs Abs: 1.1 10*3/uL (ref 0.7–4.0)
MCH: 28.6 pg (ref 26.0–34.0)
MCHC: 31.3 g/dL (ref 30.0–36.0)
MCV: 91.4 fL (ref 80.0–100.0)
Monocytes Absolute: 0.4 10*3/uL (ref 0.1–1.0)
Monocytes Relative: 7 %
Neutro Abs: 4.6 10*3/uL (ref 1.7–7.7)
Neutrophils Relative %: 73 %
Platelets: 187 10*3/uL (ref 150–400)
RBC: 3.74 MIL/uL — ABNORMAL LOW (ref 3.87–5.11)
RDW: 14.6 % (ref 11.5–15.5)
WBC: 6.3 10*3/uL (ref 4.0–10.5)
nRBC: 0 % (ref 0.0–0.2)

## 2021-04-12 LAB — BASIC METABOLIC PANEL
Anion gap: 10 (ref 5–15)
BUN: 18 mg/dL (ref 6–20)
CO2: 28 mmol/L (ref 22–32)
Calcium: 8.9 mg/dL (ref 8.9–10.3)
Chloride: 102 mmol/L (ref 98–111)
Creatinine, Ser: 1.08 mg/dL — ABNORMAL HIGH (ref 0.44–1.00)
GFR, Estimated: >60 mL/min (ref >60)
Glucose, Bld: 109 mg/dL — ABNORMAL HIGH (ref 70–99)
Potassium: 3.1 mmol/L — ABNORMAL LOW (ref 3.5–5.1)
Sodium: 140 mmol/L (ref 135–145)

## 2021-04-12 LAB — D-DIMER, QUANTITATIVE: D-Dimer, Quant: 0.62 ug/mL-FEU — ABNORMAL HIGH (ref 0.00–0.50)

## 2021-04-12 MED ORDER — ENTRESTO 97-103 MG PO TABS: 1.0000 | ORAL_TABLET | Freq: Two times a day (BID) | ORAL | 0 refills | Status: DC

## 2021-04-12 MED ORDER — NIRMATRELVIR/RITONAVIR (PAXLOVID)TABLET
2.0000 | ORAL_TABLET | Freq: Two times a day (BID) | ORAL | 0 refills | Status: AC
  Filled 2021-04-12: qty 20, 5d supply, fill #0

## 2021-04-12 MED ORDER — NIRMATRELVIR/RITONAVIR (PAXLOVID)TABLET
3.0000 | ORAL_TABLET | Freq: Two times a day (BID) | ORAL | 0 refills | Status: AC
  Filled 2021-04-12: qty 30, 5d supply, fill #0

## 2021-04-12 NOTE — ED Provider Notes (Signed)
[facility] Provider Note   CSN: [id number] Arrival date & time: 04/12/21  1404     History Chief Complaint  Patient presents with  . Covid Positive    Christine Mcdowell is a 50 y.o. female.  He has significant past medical history including rheumatoid arthritis, CHF, AICD.  She has been sick for 4 days with cough headache body aches congestion shortness of breath.  Her children tested positive for COVID.  She tested positive for COVID on a home test.  She is COVID vaccinated and boosted x1.  The history is provided by the patient.  Influenza Presenting symptoms: cough, fatigue, fever, headache, myalgias, rhinorrhea and shortness of breath   Presenting symptoms: no diarrhea and no sore throat   Severity:  Moderate Onset quality:  Gradual Progression:  Unchanged Chronicity:  Recurrent Relieved by:  Nothing Worsened by:  Nothing Ineffective treatments:  Nebulizer treatments Associated symptoms: nasal congestion   Associated symptoms: no syncope   Risk factors: immunocompromised state and sick contacts        Past Medical History:  Diagnosis Date  . AICD (automatic cardioverter/defibrillator) present 01/28/2018  . Anemia   . Anginal pain ([place])   . Asthma   . Cervical cancer ([place])    cervical 1996  . CHF (congestive heart failure) ([place])   . Diabetes mellitus without complication ([place])    steroid induced  . Discoid lupus   . Fibromyalgia   . History of blood transfusion "several"   "related to anemia; had some w/hysterectomy also"  . Hx of cardiovascular stress test    ETT-Myoview (9/15):  No ischemia, EF 52%; NORMAL  . Hx of echocardiogram    Echo (9/15):  EF 50-55%, ant HK, Gr 1 DD, mild MR, mild LAE, no effusion  . Hypertension   . Iron deficiency anemia    h/o iron transfusions  . Lupus (systemic lupus erythematosus) ([place])   . Migraine    "a few/year" (07/03/2016)  . Pneumonia 12/2015  . RA (rheumatoid arthritis) ([place])    "all  over" (07/03/2016)  . Sickle cell trait ([place])   . Stroke ([place]) 2014 X 1; 2015 X 2; 2016 X 1;    "right side of face more relaxed than the other; rare speech hesitation" (07/03/2016)  . Vaginal Pap smear, abnormal    ASCUS; HPV    Patient Active Problem List   Diagnosis Date Noted  . 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 History of COVID-19 04/12/2020  . COVID-19 virus infection 03/07/2020  . Lower abdominal pain 09/07/2019  . Chronic systolic (congestive) heart failure ([place]) 01/28/2018  . Cough 01/19/2018  . Pulmonary infiltrates on CXR 01/19/2018  . Migraines 10/08/2017  . Sleep apnea with use of continuous positive airway pressure (CPAP) 09/24/2017  . Congestive heart failure (CHF) ([place]) 10/09/2016  . Rheumatoid arthritis ([place]) 10/09/2016  . Asthma 10/09/2016  . Chronic pain 10/09/2016  . Heart failure ([place]) 10/09/2016  . Congestive heart failure ([place])   . Lupus (systemic lupus erythematosus) ([place])   . Diabetes mellitus with complication ([place])   . Anxiety state   . GERD (gastroesophageal reflux disease) 12/28/2015  . Essential hypertension 12/26/2015  . Type 2 diabetes mellitus ([place]) 12/26/2015  . Chronic systolic heart failure ([place]) 08/18/2014  . 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 Shortness of breath 01/26/2014  . SLE (systemic lupus erythematosus) ([place]) 01/26/2014    Past Surgical History:  Procedure Laterality Date  . ABDOMINAL HYSTERECTOMY  2009  .  ABDOMINAL WOUND DEHISCENCE  2009  . BUNIONECTOMY Left 06/15/2020  . CARDIAC CATHETERIZATION N/A 10/11/2016   Procedure: Right/Left Heart Cath and Coronary Angiography;  Surgeon: Jolaine Artist, MD;  Location: [facility];  Service: Cardiovascular;  Laterality: N/A;  . DILATION AND CURETTAGE OF UTERUS  1991  . HEMATOMA EVACUATION  2009   abdomen  . ICD IMPLANT  01/28/2018  . ICD IMPLANT N/A 01/28/2018   Procedure: ICD IMPLANT;  Surgeon: Evans Lance, MD;  Location: [facility];  Service: Cardiovascular;  Laterality: N/A;  . INCISE AND DRAIN ABCESS  2009 X 2   "abdomen after hysterectomy"  . KNEE ARTHROSCOPY Right 1997  . KNEE SURGERY Right   . TUBAL LIGATION  1996     OB History    Gravida  5   Para  4   Term  3   Preterm  1   AB  1   Living  4     SAB  1   IAB      Ectopic      Multiple      Live Births  4           Family History  Problem Relation Age of Onset  . Arthritis Mother   . Heart murmur Mother   . Drug abuse Mother   . Allergies Mother   . Heart attack Father   . Cushing syndrome Father   . Depression Father   . Allergies Father   . Dementia Paternal Grandmother   . Cancer Paternal Grandfather   . Diabetes Maternal Grandmother   . Hypertension Maternal Grandmother   . Asthma Maternal Grandmother   . Heart attack Maternal Grandfather   . Breast cancer Neg Hx     Social History   Tobacco Use  . Smoking status: Never Smoker  . Smokeless tobacco: Never Used  Vaping Use  . Vaping Use: Never used  Substance Use Topics  . Alcohol use: No  . Drug use: No    Home Medications Prior to Admission medications   Medication Sig Start Date End Date Taking? Authorizing Provider  albuterol (ACCUNEB) 1.25 MG/3ML nebulizer solution Take 3 mLs (1.25 mg total) by nebulization every 6 (six) hours as needed for wheezing. Patient taking differently: Take 1 ampule by nebulization 3 (three) times daily as needed for wheezing. 03/07/20  Yes [name], FNP  aspirin 81 MG chewable tablet Chew 162 mg by mouth daily. Taking 2 tablets   Yes [provider]  atorvastatin (LIPITOR) 10 MG tablet Take 1 tablet (10 mg total) by mouth daily. 04/11/21  Yes [name], MD  benzonatate (TESSALON PERLES) 100 MG capsule Take 1 capsule (100 mg total) by mouth every 6 (six) hours as needed for cough. 04/11/21 04/11/22 Yes Ghumman, Ramandeep, NP  carvedilol (COREG) 25 MG tablet TAKE ONE TABLET BY MOUTH EVERY MORNING and  TAKE ONE TABLET BY MOUTH EVERY EVENING 01/23/21  Yes Bensimhon, Shaune Pascal, MD  folic acid (FOLVITE) 1 MG tablet TAKE 1 TABLET BY MOUTH EVERY MORNING 01/30/17  Yes Bensimhon, Shaune Pascal, MD  Glucagon (GVOKE HYPOPEN 2-PACK) 1 MG/0.2ML SOAJ Inject 1 mg into the skin as needed. 01/23/21  Yes [name], FNP  hydrALAZINE (APRESOLINE) 25 MG tablet Take 3 tablets (75 mg total) by mouth 3 (three) times daily. 07/27/20 07/27/21 Yes Simmons, Brittainy M, PA-C  hydroxychloroquine (PLAQUENIL) 200 MG tablet Take 1 tablet (200 mg total) by mouth 2 (two) times daily. 02/02/19  Yes Bensimhon, Shaune Pascal, MD  Magnesium 400 MG TABS Take 1 tablet by mouth daily.    Yes [provider]  meloxicam (MOBIC) 15 MG tablet Take 15 mg by mouth daily.    Yes [provider]  metFORMIN (GLUCOPHAGE) 500 MG tablet TAKE ONE TABLET BY MOUTH TWICE DAILY 04/04/21  Yes [name], FNP  methotrexate (RHEUMATREX) 2.5 MG tablet Take 20 mg by mouth every Friday.  02/06/15  Yes [provider]  montelukast (SINGULAIR) 10 MG tablet TAKE ONE TABLET BY MOUTH EVERY MORNING 04/04/21  Yes [name], FNP  Multiple Vitamin (MULTIVITAMIN WITH MINERALS) TABS Take 1 tablet by mouth every morning.    Yes [provider]  ondansetron (ZOFRAN-ODT) 4 MG disintegrating tablet DISSOLVE ONE TABLET UNDER THE TONGUE EVERY TWELVE HOURS AS NEEDED 04/11/21  Yes [name], MD  PARoxetine (PAXIL) 10 MG tablet TAKE ONE TABLET BY MOUTH EVERY MORNING 01/23/21  Yes [name], FNP  potassium chloride SA (KLOR-CON) 20 MEQ tablet TAKE TWO TABLETS BY MOUTH EVERY MORNING 09/29/20  Yes Bensimhon, Shaune Pascal, MD  PROAIR HFA 108 [phone] Base) MCG/ACT inhaler INHALE 2 PUFFS BY MOUTH EVERY DAY AS NEEDED FOR SHORTNESS OF BREATH 03/07/20  Yes [name], FNP  sacubitril-valsartan (ENTRESTO) 97-103 MG Take 1 tablet by mouth 2 (two) times daily. Needs appt 04/12/21  Yes Bensimhon, Shaune Pascal, MD  spironolactone (ALDACTONE) 25 MG tablet TAKE ONE TABLET BY  MOUTH EVERY MORNING and TAKE ONE TABLET BY MOUTH EVERY EVENING needs appt 04/06/21  Yes Bensimhon, Shaune Pascal, MD  torsemide (DEMADEX) 20 MG tablet TAKE ONE TABLET BY MOUTH weekly on monday, wednesday, and Friday Please call for an appointment for future refills 02/28/21  Yes Bensimhon, Shaune Pascal, MD  Vitamin D, Ergocalciferol, (DRISDOL) 1.25 MG (50000 UNIT) CAPS capsule TAKE ONE CAPSULE BY MOUTH ONCE WEEKLY ON TUESDAY and TAKE ONE CAPSULE ONCE WEEKLY ON FRIDAY 02/12/21  Yes [name], FNP  amoxicillin-clavulanate (AUGMENTIN) 875-125 MG tablet Take 1 tablet by mouth 2 (two) times daily for 7 days. 04/11/21 04/18/21  Bary Castilla, NP  budesonide-formoterol (SYMBICORT) 80-4.5 MCG/ACT inhaler Inhale 2 puffs into the lungs in the morning and at bedtime. 01/25/21   [name], FNP  Continuous Blood Gluc Receiver (DEXCOM G6 RECEIVER) DEVI 1 Device by Does not apply route 3 (three) times daily before meals. 04/12/20   [name], FNP  Continuous Blood Gluc Sensor (DEXCOM G6 SENSOR) MISC Inject 1 each into the skin 3 (three) times daily. 04/12/20   [name], FNP  Continuous Blood Gluc Transmit (DEXCOM G6 TRANSMITTER) MISC 1 Device by Does not apply route 3 (three) times daily before meals. 04/12/20   [name], FNP  insulin lispro (HUMALOG KWIKPEN) 100 UNIT/ML KwikPen Use according to sliding scale 12/19/20   [name], FNP  Insulin Pen Needle (NOVOFINE PLUS PEN NEEDLE) 32G X 4 MM MISC Use with insulin pens dx code e11.65 11/09/20   [name], FNP  metolazone (ZAROXOLYN) 2.5 MG tablet TAKE 1 TABLET BY MOUTH AS NEEDED FOR WEIGHT GAIN OF 5 POUNDS WITHIN 3 DAYS AS DIRECTED needs appt for further refills 03/16/21   Bensimhon, Shaune Pascal, MD    Allergies    Hydrocodone-acetaminophen, Hydromorphone, Iodinated diagnostic agents, Other, Erythromycin, Latex, Rinvoq [upadacitinib], Farxiga [dapagliflozin], Tape, and Mircette [desogestrel-ethinyl estradiol]  Review of Systems   Review of Systems   Constitutional: Positive for fatigue and fever.  HENT: Positive for congestion and rhinorrhea. Negative for sore throat.   Eyes: Negative for  visual disturbance.  Respiratory: Positive for cough and shortness of breath.   Cardiovascular: Negative for chest pain.  Gastrointestinal: Negative for abdominal pain and diarrhea.  Genitourinary: Negative for dysuria.  Musculoskeletal: Positive for myalgias.  Skin: Negative for rash.  Neurological: Positive for headaches.    Physical Exam Updated Vital Signs BP 138/86 (BP Location: Left Arm)   Pulse 95   Temp 98.4 F (36.9 C) (Oral)   Resp 20   Ht 5\' 6"  (1.676 m)   Wt 81.6 kg   SpO2 100%   BMI 29.05 kg/m   Physical Exam Vitals and nursing note reviewed.  Constitutional:      General: She is not in acute distress.    Appearance: Normal appearance. She is well-developed.  HENT:     Head: Normocephalic and atraumatic.  Eyes:     Conjunctiva/sclera: Conjunctivae normal.  Cardiovascular:     Rate and Rhythm: Normal rate and regular rhythm.     Heart sounds: No murmur heard.   Pulmonary:     Effort: Pulmonary effort is normal. No respiratory distress.     Breath sounds: Normal breath sounds.  Abdominal:     Palpations: Abdomen is soft.     Tenderness: There is no abdominal tenderness.  Musculoskeletal:        General: No deformity or signs of injury. Normal range of motion.     Cervical back: Neck supple.  Skin:    General: Skin is warm and dry.  Neurological:     General: No focal deficit present.     Mental Status: She is alert.     ED Results / Procedures / Treatments   Labs (all labs ordered are listed, but only abnormal results are displayed) Labs Reviewed  BASIC METABOLIC PANEL - Abnormal; Notable for the following components:      Result Value   Potassium 3.1 (*)    Glucose, Bld 109 (*)    Creatinine, Ser 1.08 (*)    All other components within normal limits  CBC WITH DIFFERENTIAL/PLATELET - Abnormal;  Notable for the following components:   RBC 3.74 (*)    Hemoglobin 10.7 (*)    HCT 34.2 (*)    All other components within normal limits  D-DIMER, QUANTITATIVE (NOT AT [facility]) - Abnormal; Notable for the following components:   D-Dimer, Quant 0.62 (*)    All other components within normal limits    EKG EKG Interpretation  Date/Time:  Thursday April 12 2021 15:20:44 EDT Ventricular Rate:  83 PR Interval:  197 QRS Duration: 88 QT Interval:  397 QTC Calculation: 467 R Axis:   -33 Text Interpretation: Sinus rhythm Probable left atrial enlargement Left ventricular hypertrophy Confirmed by Aletta Edouard [phone]) on 04/12/2021 4:25:13 PM   Radiology DG Chest Portable 1 View  Result Date: 04/12/2021 CLINICAL DATA:  COVID positive for 1 day. Dyspnea and productive cough. EXAM: PORTABLE CHEST 1 VIEW COMPARISON:  06/05/2020 chest radiograph. FINDINGS: Stable configuration of single lead left subclavian ICD. Stable cardiomediastinal silhouette with mild to moderate cardiomegaly. No pneumothorax. No pleural effusion. No overt pulmonary edema. Minimal left basilar curvilinear opacity favoring atelectasis. Faint hazy opacity in the peripheral mid to upper right lung is new. IMPRESSION: 1. New faint hazy opacity in the peripheral mid to upper right lung, which could represent early/mild COVID-19 infection. Chest radiograph follow-up suggested. 2. Minimal left basilar curvilinear opacity favoring atelectasis. 3. Stable mild to moderate cardiomegaly without overt pulmonary edema. Electronically Signed   By: Ilona Sorrel  M.D.   On: 04/12/2021 14:58    Procedures Procedures   Medications Ordered in ED Medications - No data to display  ED Course  I have reviewed the triage vital signs and the nursing notes.  Pertinent labs & imaging results that were available during my care of the patient were reviewed by me and considered in my medical decision making (see chart for details).  Clinical Course as  of 04/13/21 0957  Thu Apr 12, 2021  1559 Patient has a contrast allergy.  Pulse ox is 100%.  Had shared decision-making with the patient.  We will get a D-dimer although she has had some high ones and some normal ones.  She ultimately is choosing to go home despite went over the D-dimer numbers can be because she does not want to get transferred for a VQ scan.  I think that is a reasonable decision. [MB]  6644 EKG not crossing into epic.  Normal sinus rhythm rate of 83 normal intervals LVH no acute ST-T changes. [MB]  1623 D-dimer is indeed elevated at 0.62.  Reviewed with patient and she is comfortable plan for discharge and will trial on COVID oral medication.  She understands to return to the hospital if any worsening or concerning symptoms [MB]    Clinical Course User Index [MB] Hayden Rasmussen, MD   MDM Rules/Calculators/A&P                         Ezmeralda Stefanick was evaluated in Emergency Department on 04/12/2021 for the symptoms described in the history of present illness. She was evaluated in the context of the global COVID-19 pandemic, which necessitated consideration that the patient might be at risk for infection with the SARS-CoV-2 virus that causes COVID-19. Institutional protocols and algorithms that pertain to the evaluation of patients at risk for COVID-19 are in a state of rapid change based on information released by regulatory bodies including the CDC and federal and state organizations. These policies and algorithms were followed during the patient's care in the ED.  This patient complains of cough shortness of breath body aches headache; this involves an extensive number of treatment Options and is a complaint that carries with it a high risk of complications and Morbidity. The differential includes COVID, pneumonia, viral syndrome, flu, PE, pneumothorax  I ordered, reviewed and interpreted labs, which included CBC with normal white count, hemoglobin low stable from priors,  chemistries fairly unremarkable other than mildly low potassium and elevated creatinine, D-dimer mildly elevated  I ordered imaging studies which included chest x-ray and I independently    visualized and interpreted imaging which showed hazy opacity consistent with COVID Previous records obtained and reviewed in epic, no recent admissions  After the interventions stated above, I reevaluated the patient and found patient hemodynamically stable and oxygenating well.  Discussed outpatient treatment of her COVID symptoms with oral antiviral.  Counseled on stopping her statin.  Recommended close follow-up with PCP.  Return instructions discussed   Final Clinical Impression(s) / ED Diagnoses Final diagnoses:  Acute bronchitis due to COVID-19 virus    Rx / DC Orders ED Discharge Orders         Ordered    nirmatrelvir/ritonavir EUA (PAXLOVID) TABS  2 times daily        04/12/21 1605           Hayden Rasmussen, MD 04/13/21 1000

## 2021-04-12 NOTE — Telephone Encounter (Signed)
  Sent MyChart message regarding treatment options.   Loel Dubonnet, NP

## 2021-04-12 NOTE — Discharge Instructions (Addendum)
You tested positive for COVID.  Your x-rays show some early signs of pneumonia.  We are putting you on an oral medication to help treat the COVID infection.  You will need to hold your atorvastatin for 10 days.  You will need to isolate for 10 days from the beginning of your symptoms.  Return to the emergency department if any worsening or concerning symptoms

## 2021-04-12 NOTE — Addendum Note (Signed)
Addended by: Loel Dubonnet on: 04/12/2021 04:09 PM   Modules accepted: Orders

## 2021-04-12 NOTE — Telephone Encounter (Signed)
Outpatient Oral COVID Treatment Note  I connected with [name] on 04/12/2021/4:04 PM by [facility] and verified that I am speaking with the correct person using two identifiers.  I discussed the limitations, risks, security, and privacy concerns of performing an evaluation and management service by telephone and the availability of in person appointments. I also discussed with the patient that there may be a patient responsible charge related to this service. The patient expressed understanding and agreed to proceed.  Patient location: Home Provider location: Home  Diagnosis: COVID-19 infection  Purpose of visit: Discussion of potential use of Molnupiravir or Paxlovid, a new treatment for mild to moderate COVID-19 viral infection in non-hospitalized patients.   Subjective: Patient is a 50 y.o. female who has been diagnosed with COVID 19 viral infection.  Their symptoms began on 04/08/21.    Past Medical History:  Diagnosis Date  . AICD (automatic cardioverter/defibrillator) present 01/28/2018  . Anemia   . Anginal pain ([place])   . Asthma   . Cervical cancer ([place])    cervical 1996  . CHF (congestive heart failure) ([place])   . 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 Discoid lupus   . Fibromyalgia   . History of blood transfusion "several"   "related to anemia; had some w/hysterectomy also"  . Hx of cardiovascular stress test    ETT-Myoview (9/15):  No ischemia, EF 52%; NORMAL  . Hx of echocardiogram    Echo (9/15):  EF 50-55%, ant HK, Gr 1 DD, mild MR, mild LAE, no effusion  . Hypertension   . Iron deficiency anemia    h/o iron transfusions  . Lupus (systemic lupus erythematosus) ([employer])   . Migraine    "a few/year" (07/03/2016)  . Pneumonia 12/2015  . RA (rheumatoid arthritis) ([place])    "all over" (07/03/2016)  . Sickle cell trait ([place])   . Stroke ([place]) 2014 X 1; 2015 X 2; 2016 X 1;    "right side of face more relaxed than the other; rare speech  hesitation" (07/03/2016)  . Vaginal Pap smear, abnormal    ASCUS; HPV    Allergies  Allergen Reactions  . Hydrocodone-Acetaminophen Nausea And Vomiting  . Hydromorphone Nausea And Vomiting    Other reaction(s): GI Upset (intolerance), Hypertension (intolerance) Raises blood pressure  Other reaction(s): GI Upset (intolerance), Hypertension (intolerance) Raises blood pressure to stroke level  . Iodinated Diagnostic Agents Other (See Comments)    Shuts down kidneys Shuts kidney function down  . Other Other (See Comments) and Anaphylaxis    Spicy foods and seasonings Skin Prep "makes my skin peel off" Paper tape causes skin burns  . Erythromycin Nausea And Vomiting  . Latex Hives  . Rinvoq [Upadacitinib] Other (See Comments)  . Wilder Glade [Dapagliflozin] Other (See Comments)  . Tape Other (See Comments)    "Skin burns"  . Mircette [Desogestrel-Ethinyl Estradiol] Nausea And Vomiting and Rash     Current Outpatient Medications:  .  albuterol (ACCUNEB) 1.25 MG/3ML nebulizer solution, Take 3 mLs (1.25 mg total) by nebulization every 6 (six) hours as needed for wheezing. (Patient taking differently: Take 1 ampule by nebulization 3 (three) times daily as needed for wheezing.), Disp: 75 mL, Rfl: 12 .  amoxicillin-clavulanate (AUGMENTIN) 875-125 MG tablet, Take 1 tablet by mouth 2 (two) times daily for 7 days., Disp: 14 tablet, Rfl: 0 .  aspirin 81 MG chewable tablet, Chew 162 mg by mouth daily. Taking 2 tablets, Disp: , Rfl:  .  atorvastatin (  LIPITOR) 10 MG tablet, Take 1 tablet (10 mg total) by mouth daily., Disp: 90 tablet, Rfl: 2 .  benzonatate (TESSALON PERLES) 100 MG capsule, Take 1 capsule (100 mg total) by mouth every 6 (six) hours as needed for cough., Disp: 30 capsule, Rfl: 1 .  budesonide-formoterol (SYMBICORT) 80-4.5 MCG/ACT inhaler, Inhale 2 puffs into the lungs in the morning and at bedtime., Disp: 1 each, Rfl: 5 .  carvedilol (COREG) 25 MG tablet, TAKE ONE TABLET BY MOUTH EVERY  MORNING and TAKE ONE TABLET BY MOUTH EVERY EVENING, Disp: 60 tablet, Rfl: 6 .  Continuous Blood Gluc Receiver (DEXCOM G6 RECEIVER) DEVI, 1 Device by Does not apply route 3 (three) times daily before meals., Disp: 1 each, Rfl: 1 .  Continuous Blood Gluc Sensor (DEXCOM G6 SENSOR) MISC, Inject 1 each into the skin 3 (three) times daily., Disp: 3 each, Rfl: 1 .  Continuous Blood Gluc Transmit (DEXCOM G6 TRANSMITTER) MISC, 1 Device by Does not apply route 3 (three) times daily before meals., Disp: 1 each, Rfl: 1 .  folic acid (FOLVITE) 1 MG tablet, TAKE 1 TABLET BY MOUTH EVERY MORNING, Disp: 30 tablet, Rfl: 0 .  Glucagon (GVOKE HYPOPEN 2-PACK) 1 MG/0.2ML SOAJ, Inject 1 mg into the skin as needed., Disp: 0.2 mL, Rfl: 5 .  hydrALAZINE (APRESOLINE) 25 MG tablet, Take 3 tablets (75 mg total) by mouth 3 (three) times daily., Disp: 270 tablet, Rfl: 11 .  hydroxychloroquine (PLAQUENIL) 200 MG tablet, Take 1 tablet (200 mg total) by mouth 2 (two) times daily., Disp: 180 tablet, Rfl: 0 .  insulin lispro (HUMALOG KWIKPEN) 100 UNIT/ML KwikPen, Use according to sliding scale, Disp: 15 mL, Rfl: 11 .  Insulin Pen Needle (NOVOFINE PLUS PEN NEEDLE) 32G X 4 MM MISC, Use with insulin pens dx code e11.65, Disp: 300 each, Rfl: 3 .  Magnesium 400 MG TABS, Take 1 tablet by mouth daily. , Disp: , Rfl:  .  meloxicam (MOBIC) 15 MG tablet, Take 15 mg by mouth daily. , Disp: , Rfl:  .  metFORMIN (GLUCOPHAGE) 500 MG tablet, TAKE ONE TABLET BY MOUTH TWICE DAILY, Disp: 180 tablet, Rfl: 1 .  methotrexate (RHEUMATREX) 2.5 MG tablet, Take 20 mg by mouth every Friday. , Disp: , Rfl: 3 .  metolazone (ZAROXOLYN) 2.5 MG tablet, TAKE 1 TABLET BY MOUTH AS NEEDED FOR WEIGHT GAIN OF 5 POUNDS WITHIN 3 DAYS AS DIRECTED needs appt for further refills, Disp: 5 tablet, Rfl: 3 .  montelukast (SINGULAIR) 10 MG tablet, TAKE ONE TABLET BY MOUTH EVERY MORNING, Disp: 90 tablet, Rfl: 1 .  Multiple Vitamin (MULTIVITAMIN WITH MINERALS) TABS, Take 1 tablet by  mouth every morning. , Disp: , Rfl:  .  ondansetron (ZOFRAN-ODT) 4 MG disintegrating tablet, DISSOLVE ONE TABLET UNDER THE TONGUE EVERY TWELVE HOURS AS NEEDED, Disp: 20 tablet, Rfl: 1 .  PARoxetine (PAXIL) 10 MG tablet, TAKE ONE TABLET BY MOUTH EVERY MORNING, Disp: 30 tablet, Rfl: 2 .  potassium chloride SA (KLOR-CON) 20 MEQ tablet, TAKE TWO TABLETS BY MOUTH EVERY MORNING, Disp: 60 tablet, Rfl: 6 .  PROAIR HFA 108 (90 Base) MCG/ACT inhaler, INHALE 2 PUFFS BY MOUTH EVERY DAY AS NEEDED FOR SHORTNESS OF BREATH, Disp: 18 g, Rfl: 2 .  sacubitril-valsartan (ENTRESTO) 97-103 MG, Take 1 tablet by mouth 2 (two) times daily. Needs appt, Disp: 180 tablet, Rfl: 0 .  spironolactone (ALDACTONE) 25 MG tablet, TAKE ONE TABLET BY MOUTH EVERY MORNING and TAKE ONE TABLET BY MOUTH EVERY EVENING needs appt,  Disp: 180 tablet, Rfl: 0 .  torsemide (DEMADEX) 20 MG tablet, TAKE ONE TABLET BY MOUTH weekly on monday, wednesday, and Friday Please call for an appointment for future refills, Disp: 15 tablet, Rfl: 1 .  Vitamin D, Ergocalciferol, (DRISDOL) 1.25 MG (50000 UNIT) CAPS capsule, TAKE ONE CAPSULE BY MOUTH ONCE WEEKLY ON TUESDAY and TAKE ONE CAPSULE ONCE WEEKLY ON FRIDAY, Disp: 8 capsule, Rfl: 0    Laboratory Data:  Recent Results (from the past 2160 hour(s))  CUP PACEART REMOTE DEVICE CHECK     Status: None   Collection Time: 02/28/21  4:50 AM  Result Value Ref Range   Date Time Interrogation Session [id number]    Pulse Generator Manufacturer BOST    Pulse Gen Model D232 VIGILANT EL ICD    Pulse Gen Serial Number [id number]    Clinic Name [facility] Pulse Generator Type Implantable Cardiac Defibulator    Implantable Pulse Generator Implant Date 26948546    Implantable Lead Manufacturer BOST    Implantable Lead Model 0292 [place] Reliance 4-Site SG    Implantable Lead Serial Number [id number]    Implantable Lead Implant Date [id number]    Implantable Lead Location Detail 1 UNKNOWN    Implantable  Lead Location [phone]    Lead Channel Setting Sensing Sensitivity 0.5 mV   Lead Channel Setting Sensing Adaptation Mode Adaptive Sensing    Lead Channel Setting Pacing Pulse Width 0.4 ms   Lead Channel Setting Pacing Amplitude 2.5 V   Lead Channel Impedance Value 584 ohm   HighPow Impedance 70 ohm   Battery Status BOS    Battery Remaining Longevity 156 mo   Battery Remaining Percentage 100 %   Brady Statistic RV Percent Paced 0 %  Basic metabolic panel     Status: Abnormal   Collection Time: 04/12/21  3:18 PM  Result Value Ref Range   Sodium 140 135 - 145 mmol/L   Potassium 3.1 (L) 3.5 - 5.1 mmol/L   Chloride 102 98 - 111 mmol/L   CO2 28 22 - 32 mmol/L   Glucose, Bld 109 (H) 70 - 99 mg/dL    Comment: Glucose reference range applies only to samples taken after fasting for at least 8 hours.   BUN 18 6 - 20 mg/dL   Creatinine, Ser 1.08 (H) 0.44 - 1.00 mg/dL   Calcium 8.9 8.9 - 10.3 mg/dL   GFR, Estimated >60 >60 mL/min    Comment: (NOTE) Calculated using the CKD-EPI Creatinine Equation (2021)    Anion gap 10 5 - 15    Comment: Performed at [facility], [address] [place], [place] [address]  CBC with Differential     Status: Abnormal   Collection Time: 04/12/21  3:18 PM  Result Value Ref Range   WBC 6.3 4.0 - 10.5 K/uL   RBC 3.74 (L) 3.87 - 5.11 MIL/uL   Hemoglobin 10.7 (L) 12.0 - 15.0 g/dL   HCT 34.2 (L) 36.0 - 46.0 %   MCV 91.4 80.0 - 100.0 fL   MCH 28.6 26.0 - 34.0 pg   MCHC 31.3 30.0 - 36.0 g/dL   RDW 14.6 11.5 - 15.5 %   Platelets 187 150 - 400 K/uL   nRBC 0.0 0.0 - 0.2 %   Neutrophils Relative % 73 %   Neutro Abs 4.6 1.7 - 7.7 K/uL   Lymphocytes Relative 18 %   Lymphs Abs 1.1 0.7 - 4.0 K/uL   Monocytes Relative 7 %  Monocytes Absolute 0.4 0.1 - 1.0 K/uL   Eosinophils Relative 1 %   Eosinophils Absolute 0.1 0.0 - 0.5 K/uL   Basophils Relative 0 %   Basophils Absolute 0.0 0.0 - 0.1 K/uL   Immature Granulocytes 1 %   Abs Immature Granulocytes 0.04  0.00 - 0.07 K/uL    Comment: Performed at [facility] [address], [place], [place] [address]     Assessment: 50 y.o. female with mild/moderate COVID 19 viral infection diagnosed on 04/11/21 at high risk for progression to severe COVID 19.  Plan:  This patient is a 50 y.o. female that meets the following criteria for Emergency Use Authorization of: Paxlovid 1. Age >12 yr AND > 40 kg 2. SARS-COV-2 positive test 3. Symptom onset < 5 days 4. Mild-to-moderate COVID disease with high risk for severe progression to hospitalization or death  I have spoken and communicated the following to the patient or parent/caregiver regarding: 1. Paxlovid is an unapproved drug that is authorized for use under an Emergency Use Authorization.  2. There are no adequate, approved, available products for the treatment of COVID-19 in adults who have mild-to-moderate COVID-19 and are at high risk for progressing to severe COVID-19, including hospitalization or death. 3. Other therapeutics are currently authorized. For additional information on all products authorized for treatment or prevention of COVID-19, please see [employer].  4. There are benefits and risks of taking this treatment as outlined in the "Fact Sheet for Patients and Caregivers."  5. "Fact Sheet for Patients and Caregivers" was reviewed with patient. A hard copy will be provided to patient from pharmacy prior to the patient receiving treatment. 6. Patients should continue to self-isolate and use infection control measures (e.g., wear mask, isolate, social distance, avoid sharing personal items, clean and disinfect "high touch" surfaces, and frequent handwashing) according to CDC guidelines.  7. The patient or parent/caregiver has the option to accept or refuse treatment. 8. Patient medication history was reviewed for potential  drug interactions:Interaction with home meds: Hold Atorvastatin for 5 days while taking. Per pharmD Rinqov okay at present dose. 9. Patient's GFR was calculated to be 56, and they were therefore prescribed Reduced dose (GFR 30-60) - nirmatrelvir 150mg  tab (1 tablet) by mouth twice daily AND ritonavir 100mg  tab (1 tablet) by mouth twice daily   After reviewing above information with the patient, the patient agrees to receive Paxlovid.  Follow up instructions:    . Take prescription BID x 5 days as directed . Reach out to pharmacist for counseling on medication if desired . For concerns regarding further COVID symptoms please follow up with your PCP or urgent care . For urgent or life-threatening issues, seek care at your local emergency department  The patient was provided an opportunity to ask questions, and all were answered. The patient agreed with the plan and demonstrated an understanding of the instructions.   Script sent to [facility] and opted to pick up RX.  The patient was advised to call their PCP or seek an in-person evaluation if the symptoms worsen or if the condition fails to improve as anticipated.   I provided 10 minutes of non face-to-face telephone visit time during this encounter, and > 50% was spent counseling as documented under my assessment & plan.  Loel Dubonnet, NP 04/12/2021 Christel Mormon PM

## 2021-04-12 NOTE — ED Triage Notes (Signed)
C/o SOB, Covid +  X 1 day , c/o pro cough

## 2021-04-13 ENCOUNTER — Other Ambulatory Visit: Payer: Self-pay

## 2021-04-16 ENCOUNTER — Ambulatory Visit: Payer: Self-pay

## 2021-04-16 ENCOUNTER — Telehealth: Payer: Self-pay

## 2021-04-16 ENCOUNTER — Telehealth (INDEPENDENT_AMBULATORY_CARE_PROVIDER_SITE_OTHER): Payer: Medicare Other | Admitting: Nurse Practitioner

## 2021-04-16 ENCOUNTER — Telehealth: Payer: Medicare Other

## 2021-04-16 ENCOUNTER — Other Ambulatory Visit: Payer: Self-pay

## 2021-04-16 ENCOUNTER — Encounter: Payer: Self-pay | Admitting: Nurse Practitioner

## 2021-04-16 DIAGNOSIS — I5022 Chronic systolic (congestive) heart failure: Secondary | ICD-10-CM

## 2021-04-16 DIAGNOSIS — I10 Essential (primary) hypertension: Secondary | ICD-10-CM

## 2021-04-16 DIAGNOSIS — E1169 Type 2 diabetes mellitus with other specified complication: Secondary | ICD-10-CM

## 2021-04-16 DIAGNOSIS — J4520 Mild intermittent asthma, uncomplicated: Secondary | ICD-10-CM

## 2021-04-16 DIAGNOSIS — U071 COVID-19: Secondary | ICD-10-CM

## 2021-04-16 DIAGNOSIS — M069 Rheumatoid arthritis, unspecified: Secondary | ICD-10-CM | POA: Diagnosis not present

## 2021-04-16 DIAGNOSIS — M329 Systemic lupus erythematosus, unspecified: Secondary | ICD-10-CM

## 2021-04-16 MED ORDER — BENZONATATE 100 MG PO CAPS: 100.0000 mg | ORAL_CAPSULE | Freq: Two times a day (BID) | ORAL | 0 refills | Status: AC | PRN

## 2021-04-16 MED ORDER — PREDNISONE 10 MG PO TABS: ORAL_TABLET | ORAL | 0 refills | Status: DC

## 2021-04-16 NOTE — Telephone Encounter (Signed)
  Chronic Care Management   Outreach Note  04/16/2021 Name: Christine Mcdowell MRN: [id number] DOB: January 05, 1971  Referred by: [name], MD Reason for referral : Chronic Care Management   Unsuccessful outbound call placed to the patient in response to a voice message received requesting assistance on how to obtain antibody infusion due to being covid positive. Performed chart review to note the patient was seen in ED over the weekend and prescribed oral antibody medications.  Follow Up Plan: A HIPAA compliant phone message was left for the patient providing contact information and requesting a return call. Collaboration with RN Care Manager regarding patients COVID status for clinical follow up by [facility].  Daneen Schick, BSW, CDP Social Worker, Certified Dementia Practitioner [facility] [phone]

## 2021-04-16 NOTE — Progress Notes (Signed)
Virtual Visit via Telephone Note  I connected with Christine Mcdowell on 04/16/21 at 10:30 AM EDT by telephone and verified that I am speaking with the correct person using two identifiers.  Location: Patient: home Provider: office   I discussed the limitations, risks, security and privacy concerns of performing an evaluation and management service by telephone and the availability of in person appointments. I also discussed with the patient that there may be a patient responsible charge related to this service. The patient expressed understanding and agreed to proceed.  Chief Complaint  Patient presents with  . Covid Positive    04/11/21     History of Present Illness:  Patient presents today for televisit for post-COVID care clinic.  Patient states that symptoms started on 04/08/2021.  Patient tested positive for COVID on 04/11/2021.  She was seen in the ED.  Chest x-ray showed COVID-pneumonia.  Patient was prescribed Paxil COVID and Augmentin.  She was compliant with medications.  She states that she does continue to have ongoing cough congestion, headache, and back pain.  She states that her cough is worse at night.  Patient has been doing albuterol nebulizer treatments at home.  She does have a past history of asthma.  We discussed that we could prescribe a round of prednisone.  She has taken this in the past and tolerated well. Denies f/c/s, n/v/d, hemoptysis, PND,  chest pain or edema.   Assessment and Plan:  Covid 19 Cough Headache Back pain:   Stay well hydrated  Stay active  Deep breathing exercises  May start vitamin C daily, vitamin D3  daily, Zinc daily  May take tylenol or fever or pain  May take mucinex DM twice daily  Will order Prednisone - please keep a close check on Blood sugars  Will order tessalon perles   Follow up:  Follow up in 4 weeks or sooner if needed - will need repeat imaging     I discussed the assessment and treatment plan with the  patient. The patient was provided an opportunity to ask questions and all were answered. The patient agreed with the plan and demonstrated an understanding of the instructions.   The patient was advised to call back or seek an in-person evaluation if the symptoms worsen or if the condition fails to improve as anticipated.  I provided 23 minutes of non-face-to-face time during this encounter.   Fenton Foy, NP

## 2021-04-16 NOTE — Progress Notes (Signed)
Pt states her pain is also coming from her head

## 2021-04-16 NOTE — Patient Instructions (Addendum)
Covid 19 Cough Headache Back pain:   Stay well hydrated  Stay active  Deep breathing exercises  May start vitamin C daily, vitamin D3  daily, Zinc daily  May take tylenol or fever or pain  May take mucinex DM twice daily  Will order Prednisone - please keep a close check on Blood sugars  Will order tessalon perles   Follow up:  Follow up in 4 weeks or sooner if needed - will need repeat imaging

## 2021-04-16 NOTE — Telephone Encounter (Signed)
I called to check on pt to see how she was doing I left her a vm to call the office [facility]

## 2021-04-17 ENCOUNTER — Other Ambulatory Visit (HOSPITAL_BASED_OUTPATIENT_CLINIC_OR_DEPARTMENT_OTHER): Payer: Self-pay

## 2021-04-22 DIAGNOSIS — E119 Type 2 diabetes mellitus without complications: Secondary | ICD-10-CM | POA: Diagnosis not present

## 2021-04-22 DIAGNOSIS — Z794 Long term (current) use of insulin: Secondary | ICD-10-CM | POA: Diagnosis not present

## 2021-04-23 ENCOUNTER — Telehealth: Payer: Medicare Other

## 2021-04-24 ENCOUNTER — Telehealth: Payer: Medicare Other

## 2021-04-24 ENCOUNTER — Ambulatory Visit (INDEPENDENT_AMBULATORY_CARE_PROVIDER_SITE_OTHER): Payer: Medicare Other

## 2021-04-24 DIAGNOSIS — I10 Essential (primary) hypertension: Secondary | ICD-10-CM | POA: Diagnosis not present

## 2021-04-24 DIAGNOSIS — E1169 Type 2 diabetes mellitus with other specified complication: Secondary | ICD-10-CM | POA: Diagnosis not present

## 2021-04-24 DIAGNOSIS — I5022 Chronic systolic (congestive) heart failure: Secondary | ICD-10-CM | POA: Diagnosis not present

## 2021-04-24 DIAGNOSIS — J4520 Mild intermittent asthma, uncomplicated: Secondary | ICD-10-CM | POA: Diagnosis not present

## 2021-04-24 DIAGNOSIS — M069 Rheumatoid arthritis, unspecified: Secondary | ICD-10-CM | POA: Diagnosis not present

## 2021-04-24 DIAGNOSIS — M329 Systemic lupus erythematosus, unspecified: Secondary | ICD-10-CM

## 2021-04-24 NOTE — Chronic Care Management (AMB) (Cosign Needed)
Chronic Care Management   CCM RN Visit Note  04/24/2021 Name: Christine Mcdowell MRN: [id number] DOB: 1971/01/14  Subjective: Christine Mcdowell is a 50 y.o. year old female who is a primary care patient of [name], MD. The care management team was consulted for assistance with disease management and care coordination needs.    Engaged with patient by telephone for follow up visit in response to provider referral for case management and/or care coordination services.   Consent to Services:  The patient was given information about Chronic Care Management services, agreed to services, and gave verbal consent prior to initiation of services.  Please see initial visit note for detailed documentation.   Patient agreed to services and verbal consent obtained.   Assessment: Review of patient past medical history, allergies, medications, health status, including review of consultants reports, laboratory and other test data, was performed as part of comprehensive evaluation and provision of chronic care management services.   SDOH (Social Determinants of Health) assessments and interventions performed:  No   CCM Care Plan  Allergies  Allergen Reactions  . Hydrocodone-Acetaminophen Nausea And Vomiting  . Hydromorphone Nausea And Vomiting    Other reaction(s): GI Upset (intolerance), Hypertension (intolerance) Raises blood pressure  Other reaction(s): GI Upset (intolerance), Hypertension (intolerance) Raises blood pressure to stroke level  . Iodinated Diagnostic Agents Other (See Comments)    Shuts down kidneys Shuts kidney function down  . Other Other (See Comments) and Anaphylaxis    Spicy foods and seasonings Skin Prep "makes my skin peel off" Paper tape causes skin burns  . Erythromycin Nausea And Vomiting  . Latex Hives  . Rinvoq [Upadacitinib] Other (See Comments)  . Wilder Glade [Dapagliflozin] Other (See Comments)  . Tape Other (See Comments)    "Skin burns"  . Mircette  [Desogestrel-Ethinyl Estradiol] Nausea And Vomiting and Rash    Outpatient Encounter Medications as of 04/16/2021  Medication Sig Note  . albuterol (ACCUNEB) 1.25 MG/3ML nebulizer solution Take 3 mLs (1.25 mg total) by nebulization every 6 (six) hours as needed for wheezing. (Patient taking differently: Take 1 ampule by nebulization 3 (three) times daily as needed for wheezing.)   . [EXPIRED] amoxicillin-clavulanate (AUGMENTIN) 875-125 MG tablet Take 1 tablet by mouth 2 (two) times daily for 7 days. 04/12/2021: Med has not been delivered yet  . aspirin 81 MG chewable tablet Chew 162 mg by mouth daily. Taking 2 tablets   . atorvastatin (LIPITOR) 10 MG tablet Take 1 tablet (10 mg total) by mouth daily.   . benzonatate (TESSALON) 100 MG capsule Take 1 capsule (100 mg total) by mouth 2 (two) times daily as needed for up to 10 days for cough.   . budesonide-formoterol (SYMBICORT) 80-4.5 MCG/ACT inhaler Inhale 2 puffs into the lungs in the morning and at bedtime.   . carvedilol (COREG) 25 MG tablet TAKE ONE TABLET BY MOUTH EVERY MORNING and TAKE ONE TABLET BY MOUTH EVERY EVENING   . Continuous Blood Gluc Receiver (DEXCOM G6 RECEIVER) DEVI 1 Device by Does not apply route 3 (three) times daily before meals.   . Continuous Blood Gluc Sensor (DEXCOM G6 SENSOR) MISC Inject 1 each into the skin 3 (three) times daily.   . Continuous Blood Gluc Transmit (DEXCOM G6 TRANSMITTER) MISC 1 Device by Does not apply route 3 (three) times daily before meals.   . folic acid (FOLVITE) 1 MG tablet TAKE 1 TABLET BY MOUTH EVERY MORNING   . Glucagon (GVOKE HYPOPEN 2-PACK) 1 MG/0.2ML SOAJ Inject 1 mg into  the skin as needed.   . hydrALAZINE (APRESOLINE) 25 MG tablet Take 3 tablets (75 mg total) by mouth 3 (three) times daily.   . hydroxychloroquine (PLAQUENIL) 200 MG tablet Take 1 tablet (200 mg total) by mouth 2 (two) times daily.   . insulin lispro (HUMALOG KWIKPEN) 100 UNIT/ML KwikPen Use according to sliding scale   .  Insulin Pen Needle (NOVOFINE PLUS PEN NEEDLE) 32G X 4 MM MISC Use with insulin pens dx code e11.65   . Magnesium 400 MG TABS Take 1 tablet by mouth daily.    . meloxicam (MOBIC) 15 MG tablet Take 15 mg by mouth daily.    . metFORMIN (GLUCOPHAGE) 500 MG tablet TAKE ONE TABLET BY MOUTH TWICE DAILY   . methotrexate (RHEUMATREX) 2.5 MG tablet Take 20 mg by mouth every Friday.    . metolazone (ZAROXOLYN) 2.5 MG tablet TAKE 1 TABLET BY MOUTH AS NEEDED FOR WEIGHT GAIN OF 5 POUNDS WITHIN 3 DAYS AS DIRECTED needs appt for further refills   . montelukast (SINGULAIR) 10 MG tablet TAKE ONE TABLET BY MOUTH EVERY MORNING   . Multiple Vitamin (MULTIVITAMIN WITH MINERALS) TABS Take 1 tablet by mouth every morning.    . [EXPIRED] nirmatrelvir/ritonavir EUA (PAXLOVID) TABS Take 2 tablets of nirmatrelvir (150 mg) twice daily for 5 days and 1 tablet of ritonavir (100 mg) twice daily for 5 days.   . [EXPIRED] nirmatrelvir/ritonavir EUA (PAXLOVID) TABS Take 2 tablets by mouth 2 (two) times daily for 5 days. Patient GFR is 56. Take nirmatrelvir (150 mg) one tablet(s) twice daily for 5 days and ritonavir (100 mg) one tablet twice daily for 5 days.   . ondansetron (ZOFRAN-ODT) 4 MG disintegrating tablet DISSOLVE ONE TABLET UNDER THE TONGUE EVERY TWELVE HOURS AS NEEDED   . PARoxetine (PAXIL) 10 MG tablet TAKE ONE TABLET BY MOUTH EVERY MORNING   . potassium chloride SA (KLOR-CON) 20 MEQ tablet TAKE TWO TABLETS BY MOUTH EVERY MORNING   . predniSONE (DELTASONE) 10 MG tablet Take 4 tabs for 2 days, then 3 tabs for 2 days, then 2 tabs for 2 days, then 1 tab for 2 days, then stop   . PROAIR HFA 108 (90 Base) MCG/ACT inhaler INHALE 2 PUFFS BY MOUTH EVERY DAY AS NEEDED FOR SHORTNESS OF BREATH   . sacubitril-valsartan (ENTRESTO) 97-103 MG Take 1 tablet by mouth 2 (two) times daily. Needs appt   . spironolactone (ALDACTONE) 25 MG tablet TAKE ONE TABLET BY MOUTH EVERY MORNING and TAKE ONE TABLET BY MOUTH EVERY EVENING needs appt   .  torsemide (DEMADEX) 20 MG tablet TAKE ONE TABLET BY MOUTH weekly on monday, wednesday, and Friday Please call for an appointment for future refills   . Vitamin D, Ergocalciferol, (DRISDOL) 1.25 MG (50000 UNIT) CAPS capsule TAKE ONE CAPSULE BY MOUTH ONCE WEEKLY ON TUESDAY and TAKE ONE CAPSULE ONCE WEEKLY ON FRIDAY    No facility-administered encounter medications on file as of 04/16/2021.    Patient Active Problem List   Diagnosis Date Noted  . 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 History of COVID-19 04/12/2020  . COVID-19 virus infection 03/07/2020  . Lower abdominal pain 09/07/2019  . Chronic systolic (congestive) heart failure ([place]) 01/28/2018  . Cough 01/19/2018  . Pulmonary infiltrates on CXR 01/19/2018  . Migraines 10/08/2017  . Sleep apnea with use of continuous positive airway pressure (CPAP) 09/24/2017  . Congestive heart failure (CHF) ([place]) 10/09/2016  . Rheumatoid arthritis ([place]) 10/09/2016  . Asthma 10/09/2016  .  Chronic pain 10/09/2016  . Heart failure ([place]) 10/09/2016  . Congestive heart failure ([place])   . Lupus (systemic lupus erythematosus) ([place])   . Diabetes mellitus with complication ([place])   . Anxiety state   . GERD (gastroesophageal reflux disease) 12/28/2015  . Essential hypertension 12/26/2015  . Type 2 diabetes mellitus ([place]) 12/26/2015  . Chronic systolic heart failure ([place]) 08/18/2014  . Cardiomyopathy, dilated ([place]) 01/27/2014  . Shortness of breath 01/26/2014  . SLE (systemic lupus erythematosus) ([place]) 01/26/2014    Conditions to be addressed/monitored:CHF, DM II, HTN, SLE, RA, Asthma   Care Plan : COVID 19 infection  Updates made by [name], RN since 04/24/2021 12:00 AM    Problem: COVID 19 infection   Priority: High  Onset Date: 04/20/2021    Goal: COVID 19 infection   Start Date: 04/16/2021  Expected End Date: 06/15/2021  This Visit's Progress: On track  Priority: High  Note:   Current Barriers:    Ineffective Self Health Maintenance  Clinical Goal(s):  Marland Kitchen Collaboration with [name], MD regarding development and update of comprehensive plan of care as evidenced by provider attestation and co-signature . Inter-disciplinary care team collaboration (see longitudinal plan of care)  patient will work with care management team to address care coordination and chronic disease management needs related to Disease Management  Educational Needs  Care Coordination  Medication Management and Education  Psychosocial Support   Interventions:   Evaluation of current treatment plan related to  CHF, DM II, HTN, SLE, RA, Asthma  , self-management and patient's adherence to plan as established by provider.  Collaboration with [name], MD regarding development and update of comprehensive plan of care as evidenced by provider attestation       and co-signature  Inter-disciplinary care team collaboration (see longitudinal plan of care)  Determined patient was evaluated in the [facility] ED on 04/12/21 for evaluation of cough and shortness of breath   Determined patient was diagnosed with COVID pneumonia and prescribed nirmatrelvir/ritonavir EUA (PAXLOVID) TABS  2 times daily   Discussed current symptoms have improved, however, patient is experiencing frequent diarrhea x 3 days  Determined patient contacted the [facility] to make them aware with no change in treatment recommended  Educated patient on the importance of staying well hydrated and closely monitoring her blood sugars  Sent in basket message to PCP [name], FNP regarding patient status and recommendations for frequent diarrhea  Discussed plans with patient for ongoing care management follow up and provided patient with direct contact information for care management team Self Care Activities:  . Continue to follow COVID treatment plan as directed . Contact the [facility] and or PCP for new or  worsening symptoms  . Stay well hydrated and follow hypo/hyperglycemic home regimen as directed  Patient Goals: - make a full recovery from COVID 19  Follow Up Plan: Telephone follow up appointment with care management team member scheduled for: 04/24/21    Plan:Telephone follow up appointment with care management team member scheduled for:  04/24/21  Barb Merino, RN, BSN, CCM Care Management Coordinator [facility]  Direct Phone: [phone]

## 2021-04-24 NOTE — Patient Instructions (Signed)
Goals Addressed    . COVID 19 infection resolved   On track    Timeframe:  Long-Range Goal Priority:  High Start Date:  04/16/21                           Expected End Date:  06/12/21  Next Follow Up date: 04/24/21      Self Care Activities:  . Continue to follow COVID treatment plan as directed . Contact the [facility] and or PCP for new or worsening symptoms  . Stay well hydrated and follow hypo/hyperglycemic home regimen as directed  Patient Goals: - make a full recovery from COVID 19

## 2021-04-25 ENCOUNTER — Telehealth: Payer: Self-pay

## 2021-04-25 ENCOUNTER — Ambulatory Visit: Payer: Self-pay

## 2021-04-25 ENCOUNTER — Telehealth: Payer: Medicare Other

## 2021-04-25 ENCOUNTER — Telehealth: Payer: Self-pay | Admitting: *Deleted

## 2021-04-25 DIAGNOSIS — I10 Essential (primary) hypertension: Secondary | ICD-10-CM

## 2021-04-25 DIAGNOSIS — E1169 Type 2 diabetes mellitus with other specified complication: Secondary | ICD-10-CM

## 2021-04-25 NOTE — Telephone Encounter (Signed)
error 

## 2021-04-25 NOTE — Chronic Care Management (AMB) (Signed)
  Chronic Care Management   Outreach Note  04/25/2021 Name: Christine Mcdowell MRN: [id number] DOB: 11-02-1971  Referred by: [name], MD Reason for referral : Chronic Care Management   Third unsuccessful telephone outreach was attempted today. The patient was referred to the case management team for assistance with care management and care coordination. The patient's primary care provider has been notified of our unsuccessful attempts to make or maintain contact with the patient. The care management team is pleased to engage with this patient at any time in the future should he/she be interested in assistance from the care management team.   Follow Up Plan: No SW follow up planned at this time. The patient will remain active with RN Care Manager.  Daneen Schick, BSW, CDP Social Worker, Certified Dementia Practitioner [place] [facility] [phone]

## 2021-04-25 NOTE — Chronic Care Management (AMB) (Signed)
  Care Management   Note  04/25/2021 Name: Christine Mcdowell MRN: [id number] DOB: Jun 17, 1971  Christine Mcdowell is a 50 y.o. year old female who is a primary care patient of [name], MD and is actively engaged with the care management team. I reached out to [name] by phone today to assist with scheduling a follow up visit with the Pharmacist  Follow up plan: Unsuccessful telephone outreach attempt made. A HIPAA compliant phone message was left for the patient providing contact information and requesting a return call. The care management team will reach out to the patient again over the next 1 day. If patient returns call to provider office, please advise to call [place] at [phone].  [facility]

## 2021-04-26 ENCOUNTER — Telehealth: Payer: Self-pay | Admitting: *Deleted

## 2021-04-26 NOTE — Chronic Care Management (AMB) (Signed)
Chronic Care Management   CCM RN Visit Note  04/24/2021 Name: Christine Mcdowell MRN: [id number] DOB: 30-Sep-1971  Subjective: Christine Mcdowell is a 50 y.o. year old female who is a primary care patient of [name], MD. The care management team was consulted for assistance with disease management and care coordination needs.    Engaged with patient by telephone for follow up visit in response to provider referral for case management and/or care coordination services.   Consent to Services:  The patient was given information about Chronic Care Management services, agreed to services, and gave verbal consent prior to initiation of services.  Please see initial visit note for detailed documentation.   Patient agreed to services and verbal consent obtained.   Assessment: Review of patient past medical history, allergies, medications, health status, including review of consultants reports, laboratory and other test data, was performed as part of comprehensive evaluation and provision of chronic care management services.   SDOH (Social Determinants of Health) assessments and interventions performed:  Yes, no acute needs   CCM Care Plan  Allergies  Allergen Reactions  . Hydrocodone-Acetaminophen Nausea And Vomiting  . Hydromorphone Nausea And Vomiting    Other reaction(s): GI Upset (intolerance), Hypertension (intolerance) Raises blood pressure  Other reaction(s): GI Upset (intolerance), Hypertension (intolerance) Raises blood pressure to stroke level  . Iodinated Diagnostic Agents Other (See Comments)    Shuts down kidneys Shuts kidney function down  . Other Other (See Comments) and Anaphylaxis    Spicy foods and seasonings Skin Prep "makes my skin peel off" Paper tape causes skin burns  . Erythromycin Nausea And Vomiting  . Latex Hives  . Rinvoq [Upadacitinib] Other (See Comments)  . Wilder Glade [Dapagliflozin] Other (See Comments)  . Tape Other (See Comments)    "Skin burns"   . Mircette [Desogestrel-Ethinyl Estradiol] Nausea And Vomiting and Rash    Outpatient Encounter Medications as of 04/24/2021  Medication Sig  . albuterol (ACCUNEB) 1.25 MG/3ML nebulizer solution Take 3 mLs (1.25 mg total) by nebulization every 6 (six) hours as needed for wheezing. (Patient taking differently: Take 1 ampule by nebulization 3 (three) times daily as needed for wheezing.)  . aspirin 81 MG chewable tablet Chew 162 mg by mouth daily. Taking 2 tablets  . atorvastatin (LIPITOR) 10 MG tablet Take 1 tablet (10 mg total) by mouth daily.  . benzonatate (TESSALON) 100 MG capsule Take 1 capsule (100 mg total) by mouth 2 (two) times daily as needed for up to 10 days for cough.  . budesonide-formoterol (SYMBICORT) 80-4.5 MCG/ACT inhaler Inhale 2 puffs into the lungs in the morning and at bedtime.  . carvedilol (COREG) 25 MG tablet TAKE ONE TABLET BY MOUTH EVERY MORNING and TAKE ONE TABLET BY MOUTH EVERY EVENING  . Continuous Blood Gluc Receiver (DEXCOM G6 RECEIVER) DEVI 1 Device by Does not apply route 3 (three) times daily before meals.  . Continuous Blood Gluc Sensor (DEXCOM G6 SENSOR) MISC Inject 1 each into the skin 3 (three) times daily.  . Continuous Blood Gluc Transmit (DEXCOM G6 TRANSMITTER) MISC 1 Device by Does not apply route 3 (three) times daily before meals.  . folic acid (FOLVITE) 1 MG tablet TAKE 1 TABLET BY MOUTH EVERY MORNING  . Glucagon (GVOKE HYPOPEN 2-PACK) 1 MG/0.2ML SOAJ Inject 1 mg into the skin as needed.  . hydrALAZINE (APRESOLINE) 25 MG tablet Take 3 tablets (75 mg total) by mouth 3 (three) times daily.  . hydroxychloroquine (PLAQUENIL) 200 MG tablet Take 1 tablet (200 mg  total) by mouth 2 (two) times daily.  . insulin lispro (HUMALOG KWIKPEN) 100 UNIT/ML KwikPen Use according to sliding scale  . Insulin Pen Needle (NOVOFINE PLUS PEN NEEDLE) 32G X 4 MM MISC Use with insulin pens dx code e11.65  . Magnesium 400 MG TABS Take 1 tablet by mouth daily.   . meloxicam  (MOBIC) 15 MG tablet Take 15 mg by mouth daily.   . metFORMIN (GLUCOPHAGE) 500 MG tablet TAKE ONE TABLET BY MOUTH TWICE DAILY  . methotrexate (RHEUMATREX) 2.5 MG tablet Take 20 mg by mouth every Friday.   . metolazone (ZAROXOLYN) 2.5 MG tablet TAKE 1 TABLET BY MOUTH AS NEEDED FOR WEIGHT GAIN OF 5 POUNDS WITHIN 3 DAYS AS DIRECTED needs appt for further refills  . montelukast (SINGULAIR) 10 MG tablet TAKE ONE TABLET BY MOUTH EVERY MORNING  . Multiple Vitamin (MULTIVITAMIN WITH MINERALS) TABS Take 1 tablet by mouth every morning.   . ondansetron (ZOFRAN-ODT) 4 MG disintegrating tablet DISSOLVE ONE TABLET UNDER THE TONGUE EVERY TWELVE HOURS AS NEEDED  . PARoxetine (PAXIL) 10 MG tablet TAKE ONE TABLET BY MOUTH EVERY MORNING  . potassium chloride SA (KLOR-CON) 20 MEQ tablet TAKE TWO TABLETS BY MOUTH EVERY MORNING  . predniSONE (DELTASONE) 10 MG tablet Take 4 tabs for 2 days, then 3 tabs for 2 days, then 2 tabs for 2 days, then 1 tab for 2 days, then stop  . PROAIR HFA 108 (90 Base) MCG/ACT inhaler INHALE 2 PUFFS BY MOUTH EVERY DAY AS NEEDED FOR SHORTNESS OF BREATH  . sacubitril-valsartan (ENTRESTO) 97-103 MG Take 1 tablet by mouth 2 (two) times daily. Needs appt  . spironolactone (ALDACTONE) 25 MG tablet TAKE ONE TABLET BY MOUTH EVERY MORNING and TAKE ONE TABLET BY MOUTH EVERY EVENING needs appt  . torsemide (DEMADEX) 20 MG tablet TAKE ONE TABLET BY MOUTH weekly on monday, wednesday, and Friday Please call for an appointment for future refills  . Vitamin D, Ergocalciferol, (DRISDOL) 1.25 MG (50000 UNIT) CAPS capsule TAKE ONE CAPSULE BY MOUTH ONCE WEEKLY ON TUESDAY and TAKE ONE CAPSULE ONCE WEEKLY ON FRIDAY   No facility-administered encounter medications on file as of 04/24/2021.    Patient Active Problem List   Diagnosis Date Noted  . 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 History of COVID-19 04/12/2020  . COVID-19 virus infection 03/07/2020  . Lower abdominal pain  09/07/2019  . Chronic systolic (congestive) heart failure ([place]) 01/28/2018  . Cough 01/19/2018  . Pulmonary infiltrates on CXR 01/19/2018  . Migraines 10/08/2017  . Sleep apnea with use of continuous positive airway pressure (CPAP) 09/24/2017  . Rheumatoid arthritis ([place]) 10/09/2016  . Asthma 10/09/2016  . Chronic pain 10/09/2016  . Heart failure ([place]) 10/09/2016  . Congestive heart failure ([place])   . Lupus (systemic lupus erythematosus) ([place])   . Diabetes mellitus with complication ([place])   . Anxiety state   . GERD (gastroesophageal reflux disease) 12/28/2015  . Essential hypertension 12/26/2015  . Type 2 diabetes mellitus ([place]) 12/26/2015  . Chronic systolic heart failure ([place]) 08/18/2014  . Cardiomyopathy, dilated ([place]) 01/27/2014  . Shortness of breath 01/26/2014  . SLE (systemic lupus erythematosus) ([place]) 01/26/2014    Conditions to be addressed/monitored:CHF, DM II, HTN, SLE, RA, Asthma   Care Plan : COVID 19 infection  Updates made by [name], RN since 04/26/2021 12:00 AM    Problem: COVID 19 infection   Priority: High  Onset Date: 04/20/2021    Goal: COVID 19 infection  Start Date: 04/16/2021  Expected End Date: 06/15/2021  Recent Progress: On track  Priority: High  Note:   Current Barriers:   Ineffective Self Health Maintenance  Clinical Goal(s):  Marland Kitchen Collaboration with [name], MD regarding development and update of comprehensive plan of care as evidenced by provider attestation and co-signature . Inter-disciplinary care team collaboration (see longitudinal plan of care)  patient will work with care management team to address care coordination and chronic disease management needs related to Disease Management  Educational Needs  Care Coordination  Medication Management and Education  Psychosocial Support   Interventions:   Evaluation of current treatment plan related to  COVID 19  , self-management and patient's adherence to plan as established  by provider.  Collaboration with [name], MD regarding development and update of comprehensive plan of care as evidenced by provider attestation       and co-signature  Inter-disciplinary care team collaboration (see longitudinal plan of care)  Determined patient was evaluated in the [facility] ED on 04/12/21 for evaluation of cough and shortness of breath   Determined patient was diagnosed with COVID pneumonia and prescribed nirmatrelvir/ritonavir EUA (PAXLOVID) TABS  2 times daily   Discussed current symptoms have improved, however, patient is experiencing frequent diarrhea x 3 days  Determined patient contacted the [place] clinic to make them aware with no change in treatment recommended  Educated patient on the importance of staying well hydrated and closely monitoring her blood sugars  Sent in basket message to PCP [name], FNP regarding patient status and recommendations for frequent diarrhea  Discussed plans with patient for ongoing care management follow up and provided patient with direct contact information for care management team 04/24/21 completed successful outbound call to patient   Evaluation of current treatment plan related to COVID 19 , self-management and patient's adherence to plan as established by provider.  Collaboration with [name], MD regarding development and update of comprehensive plan of care as evidenced by provider attestation       and co-signature  Inter-disciplinary care team collaboration (see longitudinal plan of care)  Determined patient feels she is making a good recovery from [place] 19  Determined patient continues to have fatigue and intermittent diarrhea  Educated on importance of staying well hydrated and closely monitoring cbg's, getting plenty of rest and using deep breathing exercises as directed  Reinforced importance of following hypo/hyperglycemic home protocol when needed and to notify PCP promptly for  unstable blood sugars not resolved by following protocol  Discussed plans with patient for ongoing care management follow up and provided patient with direct contact information for care management team Self Care Activities:  . Continue to follow COVID treatment plan as directed . Contact the [place] [facility] and or PCP for new or worsening symptoms  . Stay well hydrated and follow hypo/hyperglycemic home regimen as directed  Patient Goals: - make a full recovery from COVID 19  Follow Up Plan: Telephone follow up appointment with care management team member scheduled for: 05/09/21    Plan:Telephone follow up appointment with care management team member scheduled for:  05/09/21  Barb Merino, RN, BSN, CCM Care Management Coordinator [place] [facility]  Direct Phone: [phone]

## 2021-04-26 NOTE — Patient Instructions (Signed)
Goals Addressed    . COVID 19 infection resolved   On track    Timeframe:  Long-Range Goal Priority:  High Start Date:  04/16/21                           Expected End Date:  06/12/21  Next Follow Up date: 05/09/21     Self Care Activities:  . Continue to follow COVID treatment plan as directed . Contact the [place] clinic and or PCP for new or worsening symptoms  . Stay well hydrated and follow hypo/hyperglycemic home regimen as directed  Patient Goals: - make a full recovery from COVID 19

## 2021-04-26 NOTE — Chronic Care Management (AMB) (Signed)
  Care Management   Note  04/26/2021 Name: Christine Mcdowell MRN: [id number] DOB: 1971-09-01  Christine Mcdowell is a 50 y.o. year old female who is a primary care patient of [name], MD and is actively engaged with the care management team. I reached out to [name] by phone today to assist with scheduling a follow up visit with the Pharmacist.  Follow up plan: A second unsuccessful telephone outreach attempt made. A HIPAA compliant phone message was left for the patient providing contact information and requesting a return call. The care management team will reach out to the patient again over the next 1 day. If patient returns call to provider office, please advise to call [place] at [phone].  [facility]

## 2021-04-30 DIAGNOSIS — Z886 Allergy status to analgesic agent status: Secondary | ICD-10-CM | POA: Diagnosis not present

## 2021-04-30 DIAGNOSIS — Z91040 Latex allergy status: Secondary | ICD-10-CM | POA: Diagnosis not present

## 2021-04-30 DIAGNOSIS — Z5181 Encounter for therapeutic drug level monitoring: Secondary | ICD-10-CM | POA: Diagnosis not present

## 2021-04-30 DIAGNOSIS — M069 Rheumatoid arthritis, unspecified: Secondary | ICD-10-CM | POA: Diagnosis not present

## 2021-04-30 DIAGNOSIS — Z881 Allergy status to other antibiotic agents status: Secondary | ICD-10-CM | POA: Diagnosis not present

## 2021-04-30 DIAGNOSIS — Z79899 Other long term (current) drug therapy: Secondary | ICD-10-CM | POA: Diagnosis not present

## 2021-04-30 DIAGNOSIS — M0579 Rheumatoid arthritis with rheumatoid factor of multiple sites without organ or systems involvement: Secondary | ICD-10-CM | POA: Diagnosis not present

## 2021-04-30 DIAGNOSIS — I7300 Raynaud's syndrome without gangrene: Secondary | ICD-10-CM | POA: Diagnosis not present

## 2021-04-30 DIAGNOSIS — R768 Other specified abnormal immunological findings in serum: Secondary | ICD-10-CM | POA: Diagnosis not present

## 2021-04-30 DIAGNOSIS — M351 Other overlap syndromes: Secondary | ICD-10-CM | POA: Diagnosis not present

## 2021-04-30 DIAGNOSIS — Z7952 Long term (current) use of systemic steroids: Secondary | ICD-10-CM | POA: Diagnosis not present

## 2021-04-30 DIAGNOSIS — Z888 Allergy status to other drugs, medicaments and biological substances status: Secondary | ICD-10-CM | POA: Diagnosis not present

## 2021-04-30 DIAGNOSIS — Z885 Allergy status to narcotic agent status: Secondary | ICD-10-CM | POA: Diagnosis not present

## 2021-04-30 DIAGNOSIS — M329 Systemic lupus erythematosus, unspecified: Secondary | ICD-10-CM | POA: Diagnosis not present

## 2021-05-01 ENCOUNTER — Other Ambulatory Visit (HOSPITAL_COMMUNITY): Payer: Self-pay | Admitting: Internal Medicine

## 2021-05-01 ENCOUNTER — Other Ambulatory Visit: Payer: Self-pay | Admitting: Nurse Practitioner

## 2021-05-01 ENCOUNTER — Telehealth: Payer: Self-pay | Admitting: *Deleted

## 2021-05-01 DIAGNOSIS — R232 Flushing: Secondary | ICD-10-CM

## 2021-05-01 NOTE — Chronic Care Management (AMB) (Signed)
  Chronic Care Management   Note  05/01/2021 Name: Christine Mcdowell MRN: [id number] DOB: [birth year]  Christine Mcdowell is a 50 y.o. year old female who is a primary care patient of [name], MD. Christine Mcdowell is currently enrolled in care management services. An additional referral for Pharmacy was placed.   Follow up plan: Third unsuccessful telephone outreach attempt made. A HIPAA compliant phone message was left for the patient providing contact information and requesting a return call. Unable to make contact on outreach attempts x 3. PCP Dr. Baird Cancer notified via routed documentation in medical record. We have been unable to make contact with the patient for follow up. The care management team is available to follow up with the patient after provider conversation with the patient regarding recommendation for care management engagement and subsequent re-referral to the care management team. If patient returns call to provider office, please advise to call [place] at [phone].  [place] [facility]

## 2021-05-03 ENCOUNTER — Telehealth: Payer: Self-pay

## 2021-05-03 NOTE — Chronic Care Management (AMB) (Signed)
Chronic Care Management Pharmacy Assistant   Name: Christine Mcdowell  MRN: [id number] DOB: 01/20/1971  Reason for Encounter: Medication Review/ Medication Coordination Call/ Patient Assistance Coordination.  05/03/2021- Called [facility] phone line to inquire what process will need to be taken in getting patient new nebulizer machine supplies.     Recent office visits:  04/11/2021-Televisit-  Bary Castilla, NP (PCP)- Amoxicillin-Pot Clavulanate 875 mg- 1 tablet bid for 7 days started. 04/16/2021- Barb Merino, RN (CCM) 04/24/2021- Angle Little, RN (CCM) 04/25/2021- Daneen Schick (CCM)  Recent consult visits:  04/16/2021- Lazaro Arms, NP (Pulmonary Disease)- Prednisone 10 mg- Take 4 tablets for 2 days, then 3 tablets for 2 days, then 2 tablets for 2 days, then 1 tablet for 2 days, then stop added and Benzonate 100 mg instructions changed to 2 times daily PRN.  04/30/2021- Televisit- Dr. Lorenza Cambridge (Rheumatology)- Prednisone 10 mg- 1 tablet daily for 30 days started and Minocycline 100 mg- 1 tablet twice daily for 90 days started.   Hospital visits:  Medication Reconciliation was completed by comparing discharge summary, patient's EMR and Pharmacy list, and upon discussion with patient.  Admitted to the hospital on 04/12/2021 due to Acute bronchitis due to Covid-19 virus. Discharge date was 04/12/2021. Discharged from [facility]?Medications Started at [facility] Discharge:?? -started Nimatreivir-Ritonavir- Take 2 tablets twice daily for 5 days and 1 tablet twice daily for 5 days due to Acute bronchitis due to Covid-19 virus.  Medication Changes at Hospital Discharge: -Changed: None  Medications Discontinued at Hospital Discharge: -Stopped : None  Medications that remain the same after Hospital Discharge:??  -All other medications will remain the same.    Reviewed chart for medication changes ahead of medication coordination  call.   BP Readings from Last 3 Encounters:  04/12/21 (!) 154/91  04/11/21 (!) 150/92  11/29/20 118/76    Lab Results  Component Value Date   HGBA1C 6.5 (H) 11/29/2020     Patient obtains medications through Vials  30 Days   Last adherence delivery included:  Torsemide 20 mg- one tablet Monday, Wednesday and Friday Humalog Kwikpen- Inject 17 units sq three times daily with meals  Carvedilol 25 mg- One tablet twice a day  Hydralazine 25 mg- 3 tablets three times daily  Gvoke Hypopen- Inject 1 mg into skin as needed Folic Acid 1 mg- 1 tablet daily Potassium CL ER 20 Meq- 2 tablets by mouth daily Paroxetine 10 mg- 1 tablet daily Atorvastatin 10 mg- one tablet daily  Entresto 97 mg/103 mg- one tablet twice a day Trazodone 50 mg- 1 tablet at bedtime as needed for sleep (patient restarted medication) Diclofenac 1% gel- apply small amount to joints twice daily as needed Hydroxychloroquine 200 mg- one tablet by mouth twice daily  Spironolactone 25 mg- one tablet twice a day Metformin500 mg- one tablet twice a day Montelukast10 mg- one tablet daily Methotrexate Sodium 2.5 mg- eighttabletsby mouth once weekly onFriday Meloxicam 15 mg- One tablet by mouth daily  Vitamin D 50,000 units- one capsules two times a week, Tuesday and Friday Budes/formot Aer 80-4.5 mcg- inhale 2 puffs into lungs in the morning and bedtime. Benzonatate 100 mg - 1 capsule every 6 hours as needed for cough Fluticasone Nasal Spray- 1 spray each nostril as needed  Patient declined the following medications last month:  Metolazone 2.5 mg- 1 tablet by mouth as needed for weight gain over 5 lbsdue to using PRN weight gain. Patient still has #  15 on hand. Onetouch Verio test strips- Use as directed once daily Onetouch Del Mis Plus 33 G- Use as directed once daily. Due to using Dexcom CGM Tresiba FlexPen due to not being covered by insurance- discontinued Novolog due to using an  adequate supply, uses as a sliding scale. Tramadol 50 mg- 1 tablet daily due- Patient is no longer taking medication. Dexcom G6 Sensor Device- Patient receives through [employer]. Prednisone 5 mg 1.5 tablet dailydue to completing dose. Prednisone10 mg- Due to receiving a 20 day supply on 02/15/2021 from [facility]. OTC- Centrum Silver Multivitamin and Vitamin B due to getting from [facility] with discount card. TruePlus Pen Needles 32 G- use as directed,due to receiving a 90 day supply on 02/12/2021. Farxiga 10 mg- 1 tablet daily- due to patient discontinuing medication, never took due to hx of side effects- chronic yeast infection. Ondansetron 4 mg- dissolve one tablet under tongue every twelve hours as needed for nausea.VIAL (Dissolvable tablets please)  Patient is due for next adherence delivery on: 05/11/2021. 05/04/2021- Called patient and reviewed medications and coordinated delivery, No answer, left message to return call. 05/08/2021- Called patient, no answer, left message to return call.  No return call from patient, [facility] will contact and deliver the following medications for patient:   This delivery to include: Torsemide 20 mg- one tablet Monday, Wednesday and Friday Humalog Kwikpen- Inject 17 units sq three times daily with meals  Carvedilol 25 mg- One tablet twice a day  Hydralazine 25 mg- 3 tablets three times daily  Gvoke Hypopen- Inject 1 mg into skin as needed Folic Acid 1 mg- 1 tablet daily Potassium CL ER 20 Meq- 2 tablets by mouth daily Paroxetine 10 mg- 1 tablet daily Atorvastatin 10 mg- one tablet daily  Entresto 97 mg/103 mg- one tablet twice a day Trazodone 50 mg- 1 tablet at bedtime as needed for sleep (patient restarted medication) Diclofenac 1% gel- apply small amount to joints twice daily as needed Hydroxychloroquine 200 mg- one tablet by mouth twice daily  Spironolactone 25 mg- one tablet twice a day Metformin500 mg- one tablet twice a  day Montelukast10 mg- one tablet daily Methotrexate Sodium 2.5 mg- eighttabletsby mouth once weekly onFriday Meloxicam 15 mg- One tablet by mouth daily  Vitamin D 50,000 units- one capsules two times a week, Tuesday and Friday Budes/formot Aer 80-4.5 mcg- inhale 2 puffs into lungs in the morning and bedtime. TruePlus Pen Needles 32 G- use as directed,  Medication not sent: Metolazone 2.5 mg- 1 tablet by mouth as needed for weight gain over 5 lbsdue to using PRN weight gain. Patient still has #15 on hand. Onetouch Verio test strips- Use as directed once daily Onetouch Del Mis Plus 33 G- Use as directed once daily. Due to using Dexcom CGM Tresiba FlexPen due to not being covered by insurance- discontinued Novolog due to using an adequate supply, uses as a sliding scale. Tramadol 50 mg- 1 tablet daily due- Patient is no longer taking medication. Dexcom G6 Sensor Device- Patient receives through [employer]. Prednisone 5 mg 1.5 tablet dailydue to completing dose. Prednisone10 mg- Due to receiving a 20 day supply on 02/15/2021 from [facility]. OTC- Centrum Silver Multivitamin and Vitamin B due to getting from [facility] with discount card. Farxiga 10 mg- 1 tablet daily- due to patient discontinuing medication, never took due to hx of side effects- chronic yeast infection. Ondansetron 4 mg- dissolve one tablet under tongue every twelve hours as needed for nausea.VIAL (Dissolvable tablets please)  Benzonatate 100 mg - 1 capsule every 6 hours as needed for cough Fluticasone Nasal Spray- 1 spray each nostril as needed   We will reach out to patient next month to review medications and coordinate delivery. Medications: Outpatient Encounter Medications as of 05/03/2021  Medication Sig  . albuterol (ACCUNEB) 1.25 MG/3ML nebulizer solution Take 3 mLs (1.25 mg total) by nebulization every 6 (six) hours as needed for wheezing. (Patient taking differently: Take 1 ampule  by nebulization 3 (three) times daily as needed for wheezing.)  . aspirin 81 MG chewable tablet Chew 162 mg by mouth daily. Taking 2 tablets  . atorvastatin (LIPITOR) 10 MG tablet Take 1 tablet (10 mg total) by mouth daily.  . budesonide-formoterol (SYMBICORT) 80-4.5 MCG/ACT inhaler Inhale 2 puffs into the lungs in the morning and at bedtime.  . carvedilol (COREG) 25 MG tablet TAKE ONE TABLET BY MOUTH EVERY MORNING and TAKE ONE TABLET BY MOUTH EVERY EVENING  . Continuous Blood Gluc Receiver (DEXCOM G6 RECEIVER) DEVI 1 Device by Does not apply route 3 (three) times daily before meals.  . Continuous Blood Gluc Sensor (DEXCOM G6 SENSOR) MISC Inject 1 each into the skin 3 (three) times daily.  . Continuous Blood Gluc Transmit (DEXCOM G6 TRANSMITTER) MISC 1 Device by Does not apply route 3 (three) times daily before meals.  . folic acid (FOLVITE) 1 MG tablet TAKE 1 TABLET BY MOUTH EVERY MORNING  . Glucagon (GVOKE HYPOPEN 2-PACK) 1 MG/0.2ML SOAJ Inject 1 mg into the skin as needed.  . hydrALAZINE (APRESOLINE) 25 MG tablet Take 3 tablets (75 mg total) by mouth 3 (three) times daily.  . hydroxychloroquine (PLAQUENIL) 200 MG tablet Take 1 tablet (200 mg total) by mouth 2 (two) times daily.  . insulin lispro (HUMALOG KWIKPEN) 100 UNIT/ML KwikPen Use according to sliding scale  . Insulin Pen Needle (NOVOFINE PLUS PEN NEEDLE) 32G X 4 MM MISC Use with insulin pens dx code e11.65  . Magnesium 400 MG TABS Take 1 tablet by mouth daily.   . meloxicam (MOBIC) 15 MG tablet Take 15 mg by mouth daily.   . metFORMIN (GLUCOPHAGE) 500 MG tablet TAKE ONE TABLET BY MOUTH TWICE DAILY  . methotrexate (RHEUMATREX) 2.5 MG tablet Take 20 mg by mouth every Friday.   . metolazone (ZAROXOLYN) 2.5 MG tablet TAKE 1 TABLET BY MOUTH AS NEEDED FOR WEIGHT GAIN OF 5 POUNDS WITHIN 3 DAYS AS DIRECTED needs appt for further refills  . montelukast (SINGULAIR) 10 MG tablet TAKE ONE TABLET BY MOUTH EVERY MORNING  . Multiple Vitamin  (MULTIVITAMIN WITH MINERALS) TABS Take 1 tablet by mouth every morning.   . ondansetron (ZOFRAN-ODT) 4 MG disintegrating tablet DISSOLVE ONE TABLET UNDER THE TONGUE EVERY TWELVE HOURS AS NEEDED  . PARoxetine (PAXIL) 10 MG tablet TAKE ONE TABLET BY MOUTH EVERY MORNING  . potassium chloride SA (KLOR-CON) 20 MEQ tablet TAKE ONE TABLET BY MOUTH EVERY MORNING and TAKE ONE TABLET BY MOUTH EVERY EVENING  . predniSONE (DELTASONE) 10 MG tablet Take 4 tabs for 2 days, then 3 tabs for 2 days, then 2 tabs for 2 days, then 1 tab for 2 days, then stop  . PROAIR HFA 108 (90 Base) MCG/ACT inhaler INHALE 2 PUFFS BY MOUTH EVERY DAY AS NEEDED FOR SHORTNESS OF BREATH  . sacubitril-valsartan (ENTRESTO) 97-103 MG Take 1 tablet by mouth 2 (two) times daily. Needs appt  . spironolactone (ALDACTONE) 25 MG tablet TAKE ONE TABLET BY MOUTH EVERY MORNING and TAKE ONE TABLET BY MOUTH EVERY  EVENING needs appt  . torsemide (DEMADEX) 20 MG tablet TAKE ONE TABLET BY MOUTH WEEKLY ON monday, wednesday, AND Friday  . Vitamin D, Ergocalciferol, (DRISDOL) 1.25 MG (50000 UNIT) CAPS capsule TAKE ONE CAPSULE BY MOUTH ONCE WEEKLY ON TUESDAY and TAKE ONE CAPSULE ONCE WEEKLY ON FRIDAY   No facility-administered encounter medications on file as of 05/03/2021.     Star Rating Drugs: Atorvastatin 10 mg- Last filled 04/11/2021 for 30 day supply at [facility]. Farxiga 10 mg- Last filled 02/23/2021 for 30 day supply at [facility]. Entresto 97/103 mg- Last filled 04/11/2021 for 30 day supply at [facility] Telmisartan 80 mg- Last filled 08/12/2018 for 30 day supply at [facility] Metformin 500 mg- Last filled 04/11/2021 for 30 day supply at [facility]: Pattricia Boss, [place]

## 2021-05-08 ENCOUNTER — Other Ambulatory Visit: Payer: Self-pay | Admitting: Nurse Practitioner

## 2021-05-09 ENCOUNTER — Telehealth: Payer: Medicare Other

## 2021-05-09 ENCOUNTER — Ambulatory Visit: Payer: Self-pay

## 2021-05-09 DIAGNOSIS — M069 Rheumatoid arthritis, unspecified: Secondary | ICD-10-CM

## 2021-05-09 DIAGNOSIS — J4520 Mild intermittent asthma, uncomplicated: Secondary | ICD-10-CM

## 2021-05-09 DIAGNOSIS — E1169 Type 2 diabetes mellitus with other specified complication: Secondary | ICD-10-CM

## 2021-05-09 DIAGNOSIS — I10 Essential (primary) hypertension: Secondary | ICD-10-CM | POA: Diagnosis not present

## 2021-05-09 DIAGNOSIS — M329 Systemic lupus erythematosus, unspecified: Secondary | ICD-10-CM

## 2021-05-09 DIAGNOSIS — I5022 Chronic systolic (congestive) heart failure: Secondary | ICD-10-CM | POA: Diagnosis not present

## 2021-05-11 DIAGNOSIS — E1165 Type 2 diabetes mellitus with hyperglycemia: Secondary | ICD-10-CM | POA: Diagnosis not present

## 2021-05-16 ENCOUNTER — Ambulatory Visit: Payer: Medicare Other

## 2021-05-22 ENCOUNTER — Ambulatory Visit: Payer: Medicare Other

## 2021-05-25 NOTE — Chronic Care Management (AMB) (Signed)
Chronic Care Management   CCM RN Visit Note  05/11/2021 Name: Christine Mcdowell MRN: [id number] DOB: 16-Oct-1971  Subjective: Christine Mcdowell is a 50 y.o. year old female who is a primary care patient of [name], MD. The care management team was consulted for assistance with disease management and care coordination needs.    Engaged with patient by telephone for follow up visit in response to provider referral for case management and/or care coordination services.   Consent to Services:  The patient was given information about Chronic Care Management services, agreed to services, and gave verbal consent prior to initiation of services.  Please see initial visit note for detailed documentation.   Patient agreed to services and verbal consent obtained.   Assessment: Review of patient past medical history, allergies, medications, health status, including review of consultants reports, laboratory and other test data, was performed as part of comprehensive evaluation and provision of chronic care management services.   SDOH (Social Determinants of Health) assessments and interventions performed:    CCM Care Plan  Allergies  Allergen Reactions  . Hydrocodone-Acetaminophen Nausea And Vomiting  . Hydromorphone Nausea And Vomiting    Other reaction(s): GI Upset (intolerance), Hypertension (intolerance) Raises blood pressure  Other reaction(s): GI Upset (intolerance), Hypertension (intolerance) Raises blood pressure to stroke level  . Iodinated Diagnostic Agents Other (See Comments)    Shuts down kidneys Shuts kidney function down  . Other Other (See Comments) and Anaphylaxis    Spicy foods and seasonings Skin Prep "makes my skin peel off" Paper tape causes skin burns  . Erythromycin Nausea And Vomiting  . Latex Hives  . Rinvoq [Upadacitinib] Other (See Comments)  . Wilder Glade [Dapagliflozin] Other (See Comments)  . Tape Other (See Comments)    "Skin burns"  . Mircette  [Desogestrel-Ethinyl Estradiol] Nausea And Vomiting and Rash    Outpatient Encounter Medications as of 05/09/2021  Medication Sig  . albuterol (ACCUNEB) 1.25 MG/3ML nebulizer solution Take 3 mLs (1.25 mg total) by nebulization every 6 (six) hours as needed for wheezing. (Patient taking differently: Take 1 ampule by nebulization 3 (three) times daily as needed for wheezing.)  . aspirin 81 MG chewable tablet Chew 162 mg by mouth daily. Taking 2 tablets  . atorvastatin (LIPITOR) 10 MG tablet Take 1 tablet (10 mg total) by mouth daily.  . budesonide-formoterol (SYMBICORT) 80-4.5 MCG/ACT inhaler Inhale 2 puffs into the lungs in the morning and at bedtime.  . carvedilol (COREG) 25 MG tablet TAKE ONE TABLET BY MOUTH EVERY MORNING and TAKE ONE TABLET BY MOUTH EVERY EVENING  . Continuous Blood Gluc Receiver (DEXCOM G6 RECEIVER) DEVI 1 Device by Does not apply route 3 (three) times daily before meals.  . Continuous Blood Gluc Sensor (DEXCOM G6 SENSOR) MISC Inject 1 each into the skin 3 (three) times daily.  . Continuous Blood Gluc Transmit (DEXCOM G6 TRANSMITTER) MISC 1 Device by Does not apply route 3 (three) times daily before meals.  . folic acid (FOLVITE) 1 MG tablet TAKE 1 TABLET BY MOUTH EVERY MORNING  . Glucagon (GVOKE HYPOPEN 2-PACK) 1 MG/0.2ML SOAJ Inject 1 mg into the skin as needed.  . hydrALAZINE (APRESOLINE) 25 MG tablet Take 3 tablets (75 mg total) by mouth 3 (three) times daily.  . hydroxychloroquine (PLAQUENIL) 200 MG tablet Take 1 tablet (200 mg total) by mouth 2 (two) times daily.  . insulin lispro (HUMALOG KWIKPEN) 100 UNIT/ML KwikPen Use according to sliding scale  . Insulin Pen Needle (NOVOFINE PLUS PEN NEEDLE) 32G X  4 MM MISC Use with insulin pens dx code e11.65  . Magnesium 400 MG TABS Take 1 tablet by mouth daily.   . meloxicam (MOBIC) 15 MG tablet Take 15 mg by mouth daily.   . metFORMIN (GLUCOPHAGE) 500 MG tablet TAKE ONE TABLET BY MOUTH TWICE DAILY  . methotrexate (RHEUMATREX)  2.5 MG tablet Take 20 mg by mouth every Friday.   . metolazone (ZAROXOLYN) 2.5 MG tablet TAKE 1 TABLET BY MOUTH AS NEEDED FOR WEIGHT GAIN OF 5 POUNDS WITHIN 3 DAYS AS DIRECTED needs appt for further refills  . montelukast (SINGULAIR) 10 MG tablet TAKE ONE TABLET BY MOUTH EVERY MORNING  . Multiple Vitamin (MULTIVITAMIN WITH MINERALS) TABS Take 1 tablet by mouth every morning.   . ondansetron (ZOFRAN-ODT) 4 MG disintegrating tablet DISSOLVE ONE TABLET UNDER THE TONGUE EVERY TWELVE HOURS AS NEEDED  . PARoxetine (PAXIL) 10 MG tablet TAKE ONE TABLET BY MOUTH EVERY MORNING  . potassium chloride SA (KLOR-CON) 20 MEQ tablet TAKE ONE TABLET BY MOUTH EVERY MORNING and TAKE ONE TABLET BY MOUTH EVERY EVENING  . predniSONE (DELTASONE) 10 MG tablet Take 4 tabs for 2 days, then 3 tabs for 2 days, then 2 tabs for 2 days, then 1 tab for 2 days, then stop  . PROAIR HFA 108 (90 Base) MCG/ACT inhaler INHALE 2 PUFFS BY MOUTH EVERY DAY AS NEEDED FOR SHORTNESS OF BREATH  . sacubitril-valsartan (ENTRESTO) 97-103 MG Take 1 tablet by mouth 2 (two) times daily. Needs appt  . spironolactone (ALDACTONE) 25 MG tablet TAKE ONE TABLET BY MOUTH EVERY MORNING and TAKE ONE TABLET BY MOUTH EVERY EVENING needs appt  . torsemide (DEMADEX) 20 MG tablet TAKE ONE TABLET BY MOUTH WEEKLY ON monday, wednesday, AND Friday  . Vitamin D, Ergocalciferol, (DRISDOL) 1.25 MG (50000 UNIT) CAPS capsule TAKE ONE CAPSULE BY MOUTH ONCE WEEKLY ON TUESDAY and ONCE WEEKLY ON FRIDAY   No facility-administered encounter medications on file as of 05/09/2021.    Patient Active Problem List   Diagnosis Date Noted  . 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 History of COVID-19 04/12/2020  . COVID-19 virus infection 03/07/2020  . Lower abdominal pain 09/07/2019  . Chronic systolic (congestive) heart failure ([place]) 01/28/2018  . Cough 01/19/2018  . Pulmonary infiltrates on CXR 01/19/2018  . Migraines 10/08/2017  . Sleep apnea with  use of continuous positive airway pressure (CPAP) 09/24/2017  . Rheumatoid arthritis ([place]) 10/09/2016  . Asthma 10/09/2016  . Chronic pain 10/09/2016  . Heart failure ([place]) 10/09/2016  . Congestive heart failure ([place])   . Lupus (systemic lupus erythematosus) ([place])   . Diabetes mellitus with complication ([place])   . Anxiety state   . GERD (gastroesophageal reflux disease) 12/28/2015  . Essential hypertension 12/26/2015  . Type 2 diabetes mellitus ([place]) 12/26/2015  . Chronic systolic heart failure ([place]) 08/18/2014  . Cardiomyopathy, dilated ([place]) 01/27/2014  . Shortness of breath 01/26/2014  . SLE (systemic lupus erythematosus) ([place]) 01/26/2014    Conditions to be addressed/monitored:CHF, DM II, HTN, SLE, RA, Asthma   Care Plan : COVID 19 infection  Updates made by [name], RN since 05/25/2021 12:00 AM  Completed 05/25/2021  Problem: COVID 19 infection Resolved 05/11/2021  Priority: High  Onset Date: 04/20/2021    Goal: COVID 19 infection Completed 05/09/2021  Start Date: 04/16/2021  Expected End Date: 06/15/2021  Recent Progress: On track  Priority: High  Note:   Current Barriers:   Ineffective Self Health Maintenance  Clinical Goal(s):  .  Collaboration with [name], MD regarding development and update of comprehensive plan of care as evidenced by provider attestation and co-signature . Inter-disciplinary care team collaboration (see longitudinal plan of care)  patient will work with care management team to address care coordination and chronic disease management needs related to Disease Management  Educational Needs  Care Coordination  Medication Management and Education  Psychosocial Support   Interventions:  05/09/21 completed successful outbound call with patient   Evaluation of current treatment plan related to  COVID 19  , self-management and patient's adherence to plan as established by provider.  Collaboration with [name], MD regarding  development and update of comprehensive plan of care as evidenced by provider attestation       and co-signature  Inter-disciplinary care team collaboration (see longitudinal plan of care)  Determined patient feels she has made a full recovery from recent COVID 19 infection  Discussed patient is currently not experiencing any symptoms related to COVID 19 that would suggest long haul effects  Discussed plans with patient for ongoing care management follow up and provided patient with direct contact information for care management team Self Care Activities:  . Continue to follow COVID treatment plan as directed . Contact the [facility] and or PCP for new or worsening symptoms  . Stay well hydrated and follow hypo/hyperglycemic home regimen as directed  Patient Goals: - make a full recovery from COVID 19  Follow Up Plan: No further follow up required    Plan:Telephone follow up appointment with care management team member scheduled for:  09/10/21  Barb Merino, RN, BSN, CCM Care Management Coordinator [place] [facility]  Direct Phone: [phone]

## 2021-05-25 NOTE — Patient Instructions (Signed)
Goals Addressed    . COMPLETED: COVID 19 infection resolved       Timeframe:  Long-Range Goal Priority:  High Start Date:  04/16/21                           Expected End Date:  06/12/21  Next Follow Up date: 05/09/21     Self Care Activities:  . Continue to follow COVID treatment plan as directed . Contact the [facility] clinic and or PCP for new or worsening symptoms  . Stay well hydrated and follow hypo/hyperglycemic home regimen as directed  Patient Goals: - make a full recovery from COVID 19

## 2021-05-30 ENCOUNTER — Ambulatory Visit (INDEPENDENT_AMBULATORY_CARE_PROVIDER_SITE_OTHER): Payer: Medicare Other

## 2021-05-30 DIAGNOSIS — I420 Dilated cardiomyopathy: Secondary | ICD-10-CM | POA: Diagnosis not present

## 2021-05-30 LAB — CUP PACEART REMOTE DEVICE CHECK
Battery Remaining Longevity: 156 mo
Battery Remaining Percentage: 100 %
Brady Statistic RV Percent Paced: 0 %
Date Time Interrogation Session: 20220608045100
HighPow Impedance: 80 Ohm
Implantable Lead Implant Date: 20190206
Implantable Lead Location: 753860
Implantable Lead Model: 292
Implantable Lead Serial Number: 438194
Implantable Pulse Generator Implant Date: 20190206
Lead Channel Impedance Value: 635 Ohm
Lead Channel Setting Pacing Amplitude: 2.5 V
Lead Channel Setting Pacing Pulse Width: 0.4 ms
Lead Channel Setting Sensing Sensitivity: 0.5 mV
Pulse Gen Serial Number: 243572

## 2021-05-31 ENCOUNTER — Ambulatory Visit (INDEPENDENT_AMBULATORY_CARE_PROVIDER_SITE_OTHER): Payer: Medicare Other

## 2021-05-31 DIAGNOSIS — I5022 Chronic systolic (congestive) heart failure: Secondary | ICD-10-CM | POA: Diagnosis not present

## 2021-05-31 DIAGNOSIS — Z95810 Presence of automatic (implantable) cardiac defibrillator: Secondary | ICD-10-CM | POA: Diagnosis not present

## 2021-06-01 NOTE — Progress Notes (Signed)
EPIC Encounter for ICM Monitoring  Patient Name: Christine Mcdowell is a 50 y.o. female Date: 06/01/2021 Primary Care Physican: [name], MD Primary Cardiologist: [place] Electrophysiologist: Lovena Le Last Weight: 184 lbs                                                                               Transmission reviewed.    6/72022 [employer] Heart Failure Index 5 suggesting normal fluid levels.    Prescribed:  Torsemide 20 mg take 1 tablet (20 mg total) Mon, Wed and Friday.  Per Dr Bensimhon's 08/21/2020 note Can take extra torsemide or metolazone prn as needed Potassium 20 mEq take 1 tablet twice a day daily. Spironolactone 25 mg take 1 tablet twice a day. Metolazone 2.5 mg take one tablet as needed for weight gain of 5 lbs within 3 days.     Labs: 04/12/2021 Creatinine 1.08, BUN 18, Potassium 3.1, Sodium 140, GFR >60 A complete set of results can be found in Results Review.   Recommendations:  No changes.     Follow-up plan: ICM clinic phone appointment on 07/09/2021.  91 day device clinic remote transmission 08/29/2021.           EP/Cardiology next office visit:  Recall for 09/01/2021 with Dr. Lovena Le        Copy of ICM check sent to Dr. Lovena Le.  3 Month Trend    8 Day Data Trend          [name], RN 06/01/2021 3:39 PM

## 2021-06-04 ENCOUNTER — Telehealth: Payer: Self-pay

## 2021-06-04 NOTE — Chronic Care Management (AMB) (Signed)
Chronic Care Management Pharmacy Assistant   Name: Christine Mcdowell  MRN: [id number] DOB: 02/17/1971   Reason for Encounter: Medication Review/ Medication Coordination  Recent office visits:  05-09-2021 Christine Logan, RN (CCM)  Recent consult visits:  05-31-2021 [name], RN (Cardiology)  Hospital visits:  None in previous 6 months  Medications: Outpatient Encounter Medications as of 06/04/2021  Medication Sig   albuterol (ACCUNEB) 1.25 MG/3ML nebulizer solution Take 3 mLs (1.25 mg total) by nebulization every 6 (six) hours as needed for wheezing. (Patient taking differently: Take 1 ampule by nebulization 3 (three) times daily as needed for wheezing.)   aspirin 81 MG chewable tablet Chew 162 mg by mouth daily. Taking 2 tablets   atorvastatin (LIPITOR) 10 MG tablet Take 1 tablet (10 mg total) by mouth daily.   budesonide-formoterol (SYMBICORT) 80-4.5 MCG/ACT inhaler Inhale 2 puffs into the lungs in the morning and at bedtime.   carvedilol (COREG) 25 MG tablet TAKE ONE TABLET BY MOUTH EVERY MORNING and TAKE ONE TABLET BY MOUTH EVERY EVENING   Continuous Blood Gluc Receiver (DEXCOM G6 RECEIVER) DEVI 1 Device by Does not apply route 3 (three) times daily before meals.   Continuous Blood Gluc Sensor (DEXCOM G6 SENSOR) MISC Inject 1 each into the skin 3 (three) times daily.   Continuous Blood Gluc Transmit (DEXCOM G6 TRANSMITTER) MISC 1 Device by Does not apply route 3 (three) times daily before meals.   folic acid (FOLVITE) 1 MG tablet TAKE 1 TABLET BY MOUTH EVERY MORNING   Glucagon (GVOKE HYPOPEN 2-PACK) 1 MG/0.2ML SOAJ Inject 1 mg into the skin as needed.   hydrALAZINE (APRESOLINE) 25 MG tablet Take 3 tablets (75 mg total) by mouth 3 (three) times daily.   hydroxychloroquine (PLAQUENIL) 200 MG tablet Take 1 tablet (200 mg total) by mouth 2 (two) times daily.   insulin lispro (HUMALOG KWIKPEN) 100 UNIT/ML KwikPen Use according to sliding scale   Insulin Pen Needle (NOVOFINE  PLUS PEN NEEDLE) 32G X 4 MM MISC Use with insulin pens dx code e11.65   Magnesium 400 MG TABS Take 1 tablet by mouth daily.    meloxicam (MOBIC) 15 MG tablet Take 15 mg by mouth daily.    metFORMIN (GLUCOPHAGE) 500 MG tablet TAKE ONE TABLET BY MOUTH TWICE DAILY   methotrexate (RHEUMATREX) 2.5 MG tablet Take 20 mg by mouth every Friday.    metolazone (ZAROXOLYN) 2.5 MG tablet TAKE 1 TABLET BY MOUTH AS NEEDED FOR WEIGHT GAIN OF 5 POUNDS WITHIN 3 DAYS AS DIRECTED needs appt for further refills   montelukast (SINGULAIR) 10 MG tablet TAKE ONE TABLET BY MOUTH EVERY MORNING   Multiple Vitamin (MULTIVITAMIN WITH MINERALS) TABS Take 1 tablet by mouth every morning.    ondansetron (ZOFRAN-ODT) 4 MG disintegrating tablet DISSOLVE ONE TABLET UNDER THE TONGUE EVERY TWELVE HOURS AS NEEDED   PARoxetine (PAXIL) 10 MG tablet TAKE ONE TABLET BY MOUTH EVERY MORNING   potassium chloride SA (KLOR-CON) 20 MEQ tablet TAKE ONE TABLET BY MOUTH EVERY MORNING and TAKE ONE TABLET BY MOUTH EVERY EVENING   predniSONE (DELTASONE) 10 MG tablet Take 4 tabs for 2 days, then 3 tabs for 2 days, then 2 tabs for 2 days, then 1 tab for 2 days, then stop   PROAIR HFA 108 (90 Base) MCG/ACT inhaler INHALE 2 PUFFS BY MOUTH EVERY DAY AS NEEDED FOR SHORTNESS OF BREATH   sacubitril-valsartan (ENTRESTO) 97-103 MG Take 1 tablet by mouth 2 (two) times daily. Needs appt  spironolactone (ALDACTONE) 25 MG tablet TAKE ONE TABLET BY MOUTH EVERY MORNING and TAKE ONE TABLET BY MOUTH EVERY EVENING needs appt   torsemide (DEMADEX) 20 MG tablet TAKE ONE TABLET BY MOUTH WEEKLY ON monday, wednesday, AND Friday   Vitamin D, Ergocalciferol, (DRISDOL) 1.25 MG (50000 UNIT) CAPS capsule TAKE ONE CAPSULE BY MOUTH ONCE WEEKLY ON TUESDAY and ONCE WEEKLY ON FRIDAY   No facility-administered encounter medications on file as of 06/04/2021.   Reviewed chart for medication changes ahead of medication coordination call.  No OVs, Consults, or hospital visits since last  care coordination call/Pharmacist visit. (If appropriate, list visit date, provider name)  No medication changes indicated OR if recent visit, treatment plan here.  BP Readings from Last 3 Encounters:  04/12/21 (!) 154/91  04/11/21 (!) 150/92  11/29/20 118/76    Lab Results  Component Value Date   HGBA1C 6.5 (H) 11/29/2020     Patient obtains medications through Vials  30 Days   Last adherence delivery included:  Torsemide 20 mg- one tablet Monday, Wednesday and Friday Humalog Kwikpen- Inject 17 units sq three times daily with meals Carvedilol 25 mg- One tablet twice a day Hydralazine 25 mg- 3 tablets three times daily Gvoke Hypopen- Inject 1 mg into skin as needed Folic Acid 1 mg- 1 tablet daily Potassium CL ER 20 Meq- 2 tablets by mouth daily Paroxetine 10 mg- 1 tablet daily  Atorvastatin 10 mg- one tablet daily Entresto 97 mg/103 mg- one tablet twice a day Trazodone 50 mg- 1 tablet at bedtime as needed for sleep (patient restarted medication) Diclofenac 1% gel- apply small amount to joints twice daily as needed Hydroxychloroquine 200 mg- one tablet by mouth twice daily Spironolactone 25 mg- one tablet twice a day  Metformin 500 mg- one tablet twice a day Montelukast 10 mg- one tablet daily Methotrexate Sodium 2.5 mg- eight tablets by mouth once weekly on  Friday Meloxicam 15 mg- One tablet by mouth daily Vitamin D 50,000 units- one capsules two times a week, Tuesday and Friday Budes/formot Aer 80-4.5 mcg- inhale 2 puffs into lungs in the morning and bedtime. TruePlus Pen Needles 32 G- use as directed,  Patient declined the following medications last month: Metolazone 2.5 mg- 1 tablet by mouth as needed for weight gain over 5 lbs due to using PRN weight gain. Patient still has #15 on hand. Onetouch Verio test strips- Use as directed once daily Onetouch Del Mis Plus 33 G- Use as directed once daily.                         Due to using Dexcom CGM Tresiba FlexPen due to  not being covered by insurance- discontinued Novolog due to using an adequate supply, uses as a sliding scale. Tramadol 50 mg- 1 tablet daily due- Patient is no longer taking medication. Dexcom G6 Sensor Device- Patient receives through [employer]. Prednisone 5 mg 1.5 tablet daily due to completing dose. Prednisone 10 mg- Due to receiving a 20 day supply on 02/15/2021 from [place]. OTC- Centrum Silver Multivitamin and Vitamin B due to getting from [facility] with discount card. Farxiga 10 mg- 1 tablet daily- due to patient discontinuing medication, never took due to hx of side effects- chronic yeast infection. Ondansetron 4 mg- dissolve one tablet under tongue every twelve hours as needed for nausea. VIAL (Dissolvable tablets please) Benzonatate 100 mg - 1 capsule every 6 hours as needed for cough Fluticasone Nasal Spray- 1 spray  each nostril as needed  Patient is due for next adherence delivery on:  Called patient and reviewed medications and coordinated delivery.  This delivery to include: Torsemide 20 mg- one tablet Monday, Wednesday and Friday Trazodone 50 mg- 1 tablet at bedtime as needed for sleep (patient restarted medication) Vitamin D 50,000 units- one capsules two times a week, Tuesday and Friday Meloxicam 15 mg- daily Folic Acid 1 mg- 1 tablet daily Insulin Lispro Potassium CL ER 20 Meq- 2 tablets by mouth daily Carvedilol 25 mg- One tablet twice a day Budes/formot Aer 80-4.5 mcg- inhale 2 puffs into lungs in the morning and bedtime. Atorvastatin 10 mg- one tablet daily Hydralazine 25 mg- 3 tablets three times daily Paroxetine 10 mg- 1 tablet daily  TruePlus Pen Needles 32 G- use as directed Metformin 500 mg- one tablet twice a day Montelukast 10 mg- one tablet daily Diclofenac 1% gel- apply small amount to joints twice daily as needed Hydroxychloroquine 200 mg- one tablet by mouth twice daily Spironolactone 25 mg- one tablet twice a day  Entresto 97 mg/103 mg- one  tablet twice a day Prednisone 10 mg- 1 tablet daily  No acute or short fill needed  Patient declined the following medications (meds) due to (reason): Metolazone 2.5 mg- 1 tablet by mouth as needed for weight gain over 5 lbs due to using PRN weight gain. Patient still has #15 on hand. Onetouch Verio test strips- Use as directed once daily Onetouch Del Mis Plus 33 G- Use as directed once daily.                         Due to using Dexcom CGM Tresiba FlexPen due to not being covered by insurance- discontinued Novolog due to using an adequate supply, uses as a sliding scale. Tramadol 50 mg- 1 tablet daily due- Patient is no longer taking medication. Dexcom G6 Sensor Device- Patient receives through [employer]. Prednisone 5 mg 1.5 tablet daily due to completing dose. Prednisone 10 mg- Due to receiving a 20 day supply on 02/15/2021 from [place]. OTC- Centrum Silver Multivitamin and Vitamin B due to getting from [facility] with discount card. Farxiga 10 mg- 1 tablet daily- due to patient discontinuing medication, never took due to hx of side effects- chronic yeast infection. Ondansetron 4 mg- dissolve one tablet under tongue every twelve hours as needed for nausea. VIAL (Dissolvable tablets please) Benzonatate 100 mg - 1 capsule every 6 hours as needed for cough Fluticasone Nasal  Hydralazine 25 mg- 3 tablets three times daily. Patient will call when out.  Patient needs refills for: Vitamin D 50,000 units- one capsules two times a week Meloxicam 15 mg- One tablet by mouth daily Folic Acid 1 mg- 1 tablet daily  Confirmed delivery date of 06-08-2021, advised patient that pharmacy will contact them the morning of delivery  NOTES Patient requested an first route delivery and to add Predisone. Refill request sent to pharmacist to request from Lorenza Cambridge MD and PCP.  Star Rating Drugs: Atorvastatin 10 mg- Last fill 05-08-2021 [facility] Entresto 97/103 mg- Last filled 05-08-2021 30  [facility] Metformin 500 mg- Last filled 05-08-2021 [facility]  [place] Clinical Pharmacist Assistant [phone]

## 2021-06-07 ENCOUNTER — Other Ambulatory Visit: Payer: Self-pay

## 2021-06-07 MED ORDER — VITAMIN D (ERGOCALCIFEROL) 1.25 MG (50000 UNIT) PO CAPS: ORAL_CAPSULE | ORAL | 0 refills | Status: DC

## 2021-06-11 DIAGNOSIS — E1165 Type 2 diabetes mellitus with hyperglycemia: Secondary | ICD-10-CM | POA: Diagnosis not present

## 2021-06-20 ENCOUNTER — Ambulatory Visit: Payer: Self-pay | Admitting: Nurse Practitioner

## 2021-06-22 NOTE — Progress Notes (Signed)
Remote ICD transmission.   

## 2021-07-03 ENCOUNTER — Other Ambulatory Visit: Payer: Self-pay

## 2021-07-03 ENCOUNTER — Telehealth: Payer: Self-pay

## 2021-07-03 MED ORDER — VITAMIN D (ERGOCALCIFEROL) 1.25 MG (50000 UNIT) PO CAPS: ORAL_CAPSULE | ORAL | 0 refills | Status: DC

## 2021-07-03 NOTE — Chronic Care Management (AMB) (Addendum)
Chronic Care Management Pharmacy Assistant   Name: Christine Mcdowell  MRN: [id number] DOB: January 07, 1971   Reason for Encounter: Medication Review/ Medication Coordination   Recent office visits:  None  Recent consult visits:  None  Hospital visits:  None in previous 6 months  Medications: Outpatient Encounter Medications as of 07/03/2021  Medication Sig   albuterol (ACCUNEB) 1.25 MG/3ML nebulizer solution Take 3 mLs (1.25 mg total) by nebulization every 6 (six) hours as needed for wheezing. (Patient taking differently: Take 1 ampule by nebulization 3 (three) times daily as needed for wheezing.)   aspirin 81 MG chewable tablet Chew 162 mg by mouth daily. Taking 2 tablets   atorvastatin (LIPITOR) 10 MG tablet Take 1 tablet (10 mg total) by mouth daily.   budesonide-formoterol (SYMBICORT) 80-4.5 MCG/ACT inhaler Inhale 2 puffs into the lungs in the morning and at bedtime.   carvedilol (COREG) 25 MG tablet TAKE ONE TABLET BY MOUTH EVERY MORNING and TAKE ONE TABLET BY MOUTH EVERY EVENING   Continuous Blood Gluc Receiver (DEXCOM G6 RECEIVER) DEVI 1 Device by Does not apply route 3 (three) times daily before meals.   Continuous Blood Gluc Sensor (DEXCOM G6 SENSOR) MISC Inject 1 each into the skin 3 (three) times daily.   Continuous Blood Gluc Transmit (DEXCOM G6 TRANSMITTER) MISC 1 Device by Does not apply route 3 (three) times daily before meals.   folic acid (FOLVITE) 1 MG tablet TAKE 1 TABLET BY MOUTH EVERY MORNING   Glucagon (GVOKE HYPOPEN 2-PACK) 1 MG/0.2ML SOAJ Inject 1 mg into the skin as needed.   hydrALAZINE (APRESOLINE) 25 MG tablet Take 3 tablets (75 mg total) by mouth 3 (three) times daily.   hydroxychloroquine (PLAQUENIL) 200 MG tablet Take 1 tablet (200 mg total) by mouth 2 (two) times daily.   insulin lispro (HUMALOG KWIKPEN) 100 UNIT/ML KwikPen Use according to sliding scale   Insulin Pen Needle (NOVOFINE PLUS PEN NEEDLE) 32G X 4 MM MISC Use with insulin pens dx code e11.65    Magnesium 400 MG TABS Take 1 tablet by mouth daily.    meloxicam (MOBIC) 15 MG tablet Take 15 mg by mouth daily.    metFORMIN (GLUCOPHAGE) 500 MG tablet TAKE ONE TABLET BY MOUTH TWICE DAILY   methotrexate (RHEUMATREX) 2.5 MG tablet Take 20 mg by mouth every Friday.    metolazone (ZAROXOLYN) 2.5 MG tablet TAKE 1 TABLET BY MOUTH AS NEEDED FOR WEIGHT GAIN OF 5 POUNDS WITHIN 3 DAYS AS DIRECTED needs appt for further refills   montelukast (SINGULAIR) 10 MG tablet TAKE ONE TABLET BY MOUTH EVERY MORNING   Multiple Vitamin (MULTIVITAMIN WITH MINERALS) TABS Take 1 tablet by mouth every morning.    ondansetron (ZOFRAN-ODT) 4 MG disintegrating tablet DISSOLVE ONE TABLET UNDER THE TONGUE EVERY TWELVE HOURS AS NEEDED   PARoxetine (PAXIL) 10 MG tablet TAKE ONE TABLET BY MOUTH EVERY MORNING   potassium chloride SA (KLOR-CON) 20 MEQ tablet TAKE ONE TABLET BY MOUTH EVERY MORNING and TAKE ONE TABLET BY MOUTH EVERY EVENING   predniSONE (DELTASONE) 10 MG tablet Take 4 tabs for 2 days, then 3 tabs for 2 days, then 2 tabs for 2 days, then 1 tab for 2 days, then stop   PROAIR HFA 108 (90 Base) MCG/ACT inhaler INHALE 2 PUFFS BY MOUTH EVERY DAY AS NEEDED FOR SHORTNESS OF BREATH   sacubitril-valsartan (ENTRESTO) 97-103 MG Take 1 tablet by mouth 2 (two) times daily. Needs appt   spironolactone (ALDACTONE) 25 MG tablet TAKE ONE TABLET BY  MOUTH EVERY MORNING and TAKE ONE TABLET BY MOUTH EVERY EVENING needs appt   torsemide (DEMADEX) 20 MG tablet TAKE ONE TABLET BY MOUTH WEEKLY ON monday, wednesday, AND Friday   Vitamin D, Ergocalciferol, (DRISDOL) 1.25 MG (50000 UNIT) CAPS capsule TAKE ONE CAPSULE BY MOUTH ONCE WEEKLY ON TUESDAY and ONCE WEEKLY ON FRIDAY   No facility-administered encounter medications on file as of 07/03/2021.  Reviewed chart for medication changes ahead of medication coordination call.  No OVs, Consults, or hospital visits since last care coordination call/Pharmacist visit. (If appropriate, list visit  date, provider name)  No medication changes indicated OR if recent visit, treatment plan here.  BP Readings from Last 3 Encounters:  04/12/21 (!) 154/91  04/11/21 (!) 150/92  11/29/20 118/76    Lab Results  Component Value Date   HGBA1C 6.5 (H) 11/29/2020     Patient obtains medications through Vials  30 Days   Last adherence delivery included:  Torsemide 20 mg- one tablet Monday, Wednesday and Friday Trazodone 50 mg- 1 tablet at bedtime as needed for sleep (patient restarted medication) Vitamin D 50,000 units- one capsules two times a week, Tuesday and Friday Meloxicam 15 mg- daily Folic Acid 1 mg- 1 tablet daily Insulin Lispro Potassium CL ER 20 Meq- 2 tablets by mouth daily Carvedilol 25 mg- One tablet twice a day Budes/formot Aer 80-4.5 mcg- inhale 2 puffs into lungs in the morning and bedtime. Atorvastatin 10 mg- one tablet daily Hydralazine 25 mg- 3 tablets three times daily Paroxetine 10 mg- 1 tablet daily  TruePlus Pen Needles 32 G- use as directed Metformin 500 mg- one tablet twice a day Montelukast 10 mg- one tablet daily Diclofenac 1% gel- apply small amount to joints twice daily as needed Hydroxychloroquine 200 mg- one tablet by mouth twice daily Spironolactone 25 mg- one tablet twice a day  Entresto 97 mg/103 mg- one tablet twice a day Prednisone 10 mg- 1 tablet daily  Patient declined (meds) last month: Metolazone 2.5 mg- 1 tablet by mouth as needed for weight gain over 5 lbs due to using PRN weight gain. Patient still has #15 on hand. Onetouch Verio test strips- Use as directed once daily Onetouch Del Mis Plus 33 G- Use as directed once daily.                         Due to using Dexcom CGM Tresiba FlexPen due to not being covered by insurance- discontinued Novolog due to using an adequate supply, uses as a sliding scale. Tramadol 50 mg- 1 tablet daily due- Patient is no longer taking medication. Dexcom G6 Sensor Device- Patient receives through [employer]. Prednisone 5 mg 1.5 tablet daily due to completing dose. Prednisone 10 mg- Due to receiving a 20 day supply on 02/15/2021 from [place]. OTC- Centrum Silver Multivitamin and Vitamin B due to getting from [facility] with discount card. Farxiga 10 mg- 1 tablet daily- due to patient discontinuing medication, never took due to hx of side effects- chronic yeast infection. Ondansetron 4 mg- dissolve one tablet under tongue every twelve hours as needed for nausea. VIAL (Dissolvable tablets please) Benzonatate 100 mg - 1 capsule every 6 hours as needed for cough Fluticasone Nasal  Hydralazine 25 mg- 3 tablets three times daily. Patient will call when out.  Patient is due for next adherence delivery on: 07-11-2021  Called patient and reviewed medications and coordinated delivery.  This delivery to include: Folic Acid 1 mg- 1 tablet  daily Insulin Lispro Torsemide 20 mg- one tablet Monday, Wednesday and Friday Potassium CL ER 20 Meq- 2 tablets by mouth daily Carvedilol 25 mg- One tablet twice a day Budes/formot Aer 80-4.5 mcg- inhale 2 puffs into lungs in the morning and bedtime. Atorvastatin 10 mg- one tablet daily Hydralazine 25 mg- 3 tablets three times daily TruePlus Pen Needles 32 G- use as directed Metformin 500 mg- one tablet twice a day Montelukast 10 mg- one tablet daily Paroxetine 10 mg- 1 tablet daily Trazodone 50 mg- 1 tablet at bedtime as needed for sleep (patient restarted medication) Entresto 97 mg/103 mg- one tablet twice a day Hydroxychloroquine 200 mg- one tablet by mouth twice daily Diclofenac 1% gel- apply small amount to joints twice daily as needed Spironolactone 25 mg- one tablet twice a day  Meloxicam 15 mg- daily Vitamin D 50,000 units- one capsules two times a week, Tuesday and Friday Methotrexate 2.5 mg- 20 mg every Friday  No acute or short fill needed  Patient declined the following medications (meds) due to (reason)  Refill request sent for: Folic  Acid 1 mg- 1 tablet daily Hydroxychloroquine 200 mg- one tablet by mouth twice daily Vitamin D 50,000 units- one capsules two times a week, Tuesday and Friday Diclofenac 1% gel- apply small amount to joints twice daily as needed Spironolactone 25 mg- one tablet twice a day  Meloxicam 15 mg- daily  NOTES: Received message from pharmacy on 07-09-2021 that meloxicam was denied and still no refills available for Plaquenil and Diclofenac sodium. Called Dr. Donzetta Matters office and was told patient and Dr. Rogers Blocker would discuss the continuation of meloxicam at next appointment and the nurse stated she has put in a request for plaquenil and diclofenac gel. Sent message to [facility] team. 07-11-2021 message received from pharmacist stating they're still waiting for patient's refills. Called [facility] again and the nurse sent request as high priority. Informed pharmacy team.  Confirmed delivery date of 07-11-2021 advised patient that pharmacy will contact them the morning of delivery.  Star Rating Drugs: Atorvastatin 10 mg- Last filled 06-07-2021 [facility] Entresto 97/103 mg- Last filled 06-07-2021 [facility] Metformin 500 mg- Last filled 06-07-2021 30 [facility]  [place] Clinical Pharmacist Assistant [phone]

## 2021-07-05 ENCOUNTER — Other Ambulatory Visit (HOSPITAL_COMMUNITY): Payer: Self-pay | Admitting: Internal Medicine

## 2021-07-09 ENCOUNTER — Ambulatory Visit (INDEPENDENT_AMBULATORY_CARE_PROVIDER_SITE_OTHER): Payer: Medicare Other

## 2021-07-09 DIAGNOSIS — Z95810 Presence of automatic (implantable) cardiac defibrillator: Secondary | ICD-10-CM

## 2021-07-09 DIAGNOSIS — I5022 Chronic systolic (congestive) heart failure: Secondary | ICD-10-CM

## 2021-07-11 ENCOUNTER — Telehealth: Payer: Self-pay

## 2021-07-11 DIAGNOSIS — E1165 Type 2 diabetes mellitus with hyperglycemia: Secondary | ICD-10-CM | POA: Diagnosis not present

## 2021-07-11 NOTE — Telephone Encounter (Signed)
Remote ICM transmission received.  Attempted call to patient regarding ICM remote transmission and left message to return call   

## 2021-07-11 NOTE — Progress Notes (Signed)
EPIC Encounter for ICM Monitoring  Patient Name: Christine Mcdowell is a 50 y.o. female Date: 07/11/2021 Primary Care Physican: [name], MD Primary Cardiologist: [place] Electrophysiologist: Lovena Le Last Weight: 184 lbs                                                                               Attempted call to patient and unable to reach.  Left message to return call. Transmission reviewed.    07/08/2021 [employer] Heart Failure Index 10 suggesting normal fluid levels.    Prescribed:  Torsemide 20 mg take 1 tablet (20 mg total) Mon, Wed and Friday.  Per Dr Bensimhon's 08/21/2020 note Can take extra torsemide or metolazone prn as needed Potassium 20 mEq take 1 tablet twice a day daily. Spironolactone 25 mg take 1 tablet twice a day. Metolazone 2.5 mg take one tablet as needed for weight gain of 5 lbs within 3 days.     Labs: 04/12/2021 Creatinine 1.08, BUN 18, Potassium 3.1, Sodium 140, GFR >60 A complete set of results can be found in Results Review.   Recommendations:  Unable to reach.     Follow-up plan: ICM clinic phone appointment on 08/13/2021.  91 day device clinic remote transmission 08/29/2021.           EP/Cardiology next office visit:  Recall for 09/01/2021 with Dr. Lovena Le        Copy of ICM check sent to Dr. Lovena Le.  3 Month Trend    8 Day Data Trend          [name], RN 07/11/2021 9:33 AM

## 2021-07-16 DIAGNOSIS — Z7952 Long term (current) use of systemic steroids: Secondary | ICD-10-CM | POA: Diagnosis not present

## 2021-07-16 DIAGNOSIS — Z79899 Other long term (current) drug therapy: Secondary | ICD-10-CM | POA: Diagnosis not present

## 2021-07-16 DIAGNOSIS — M351 Other overlap syndromes: Secondary | ICD-10-CM | POA: Diagnosis not present

## 2021-07-16 DIAGNOSIS — M1711 Unilateral primary osteoarthritis, right knee: Secondary | ICD-10-CM | POA: Diagnosis not present

## 2021-07-16 DIAGNOSIS — M069 Rheumatoid arthritis, unspecified: Secondary | ICD-10-CM | POA: Diagnosis not present

## 2021-07-16 DIAGNOSIS — M545 Low back pain, unspecified: Secondary | ICD-10-CM | POA: Diagnosis not present

## 2021-07-16 DIAGNOSIS — D688 Other specified coagulation defects: Secondary | ICD-10-CM | POA: Diagnosis not present

## 2021-07-16 DIAGNOSIS — G8929 Other chronic pain: Secondary | ICD-10-CM | POA: Diagnosis not present

## 2021-07-16 DIAGNOSIS — D649 Anemia, unspecified: Secondary | ICD-10-CM | POA: Diagnosis not present

## 2021-07-16 DIAGNOSIS — I110 Hypertensive heart disease with heart failure: Secondary | ICD-10-CM | POA: Diagnosis not present

## 2021-07-16 DIAGNOSIS — I7300 Raynaud's syndrome without gangrene: Secondary | ICD-10-CM | POA: Diagnosis not present

## 2021-07-16 DIAGNOSIS — M329 Systemic lupus erythematosus, unspecified: Secondary | ICD-10-CM | POA: Diagnosis not present

## 2021-07-16 DIAGNOSIS — M0579 Rheumatoid arthritis with rheumatoid factor of multiple sites without organ or systems involvement: Secondary | ICD-10-CM | POA: Diagnosis not present

## 2021-07-25 ENCOUNTER — Encounter: Payer: Self-pay | Admitting: Nurse Practitioner

## 2021-07-25 ENCOUNTER — Other Ambulatory Visit (HOSPITAL_COMMUNITY): Payer: Self-pay | Admitting: Cardiology

## 2021-07-25 ENCOUNTER — Other Ambulatory Visit: Payer: Self-pay

## 2021-07-25 ENCOUNTER — Other Ambulatory Visit: Payer: Self-pay | Admitting: Nurse Practitioner

## 2021-07-25 ENCOUNTER — Ambulatory Visit (INDEPENDENT_AMBULATORY_CARE_PROVIDER_SITE_OTHER): Payer: Medicare Other | Admitting: Nurse Practitioner

## 2021-07-25 VITALS — BP 138/70 | HR 74 | Temp 98.7°F | Ht 66.0 in | Wt 190.0 lb

## 2021-07-25 DIAGNOSIS — I10 Essential (primary) hypertension: Secondary | ICD-10-CM | POA: Diagnosis not present

## 2021-07-25 DIAGNOSIS — E6609 Other obesity due to excess calories: Secondary | ICD-10-CM

## 2021-07-25 DIAGNOSIS — E1169 Type 2 diabetes mellitus with other specified complication: Secondary | ICD-10-CM | POA: Diagnosis not present

## 2021-07-25 DIAGNOSIS — E66811 Obesity, class 1: Secondary | ICD-10-CM

## 2021-07-25 DIAGNOSIS — M329 Systemic lupus erythematosus, unspecified: Secondary | ICD-10-CM

## 2021-07-25 DIAGNOSIS — Z1211 Encounter for screening for malignant neoplasm of colon: Secondary | ICD-10-CM

## 2021-07-25 DIAGNOSIS — Z6830 Body mass index (BMI) 30.0-30.9, adult: Secondary | ICD-10-CM

## 2021-07-25 DIAGNOSIS — Z23 Encounter for immunization: Secondary | ICD-10-CM

## 2021-07-25 DIAGNOSIS — R232 Flushing: Secondary | ICD-10-CM

## 2021-07-25 MED ORDER — SHINGRIX 50 MCG/0.5ML IM SUSR: 0.5000 mL | Freq: Once | INTRAMUSCULAR | 1 refills | Status: AC

## 2021-07-25 NOTE — Progress Notes (Signed)
I,Tianna Badgett,acting as a Education administrator for [employer], FNP.,have documented all relevant documentation on the behalf of [name], FNP,as directed by  [name], FNP while in the presence of [name], [place].  This visit occurred during the SARS-CoV-2 public health emergency.  Safety protocols were in place, including screening questions prior to the visit, additional usage of staff PPE, and extensive cleaning of exam room while observing appropriate contact time as indicated for disinfecting solutions.  Subjective:     Patient ID: Christine Mcdowell , female    DOB: 11-Nov-1971 , 50 y.o.   MRN: [id number]   Chief Complaint  Patient presents with   Diabetes    HPI  Patient here for dm f/u.  She is now back on Prednisone 20 mg for about 2 weeks. She is also on Kineret injections due to her lupus flaring after having covid. Her blood sugars have been increasing more at night. At night she will take about 10 units on average. During the day taking 5-8 units.  She is using the Dexcom. Her blood sugars have been in the 300's and as high as 400's. She is taking sliding scale insulin 4 times a day. She has an ophthalmology appt upcoming. She has been having left ankle pain and is to call the surgeon who did the bunionectomy.   Diabetes She presents for her follow-up diabetic visit. She has type 2 diabetes mellitus. Pertinent negatives for hypoglycemia include no confusion or nervousness/anxiousness. There are no diabetic associated symptoms. Pertinent negatives for diabetes include no polydipsia, no polyphagia and no polyuria. (She has drank juice to help increase her blood sugars. ) There are no diabetic complications. Risk factors for coronary artery disease include sedentary lifestyle and obesity. Current diabetic treatment includes oral agent (dual therapy). She is compliant with treatment all of the time. She is following a generally healthy diet. When asked about meal planning, she reported  none. She has not had a previous visit with a dietitian. She rarely participates in exercise. (See above) An ACE inhibitor/angiotensin II receptor blocker is being taken. She does not see a podiatrist.Eye exam is not current.    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   Anemia    Anginal pain (HCC)    Asthma    Cervical cancer ([place])    cervical 1996   CHF (congestive heart failure) ([place])    Diabetes mellitus without complication ([place])    steroid induced   Discoid lupus    Fibromyalgia    History of blood transfusion "several"   "related to anemia; had some w/hysterectomy also"   Hx of cardiovascular stress test    ETT-Myoview (9/15):  No ischemia, EF 52%; NORMAL   Hx of echocardiogram    Echo (9/15):  EF 50-55%, ant HK, Gr 1 DD, mild MR, mild LAE, no effusion   Hypertension    Iron deficiency anemia    h/o iron transfusions   Lupus (systemic lupus erythematosus) ([place])    Migraine    "a few/year" (07/03/2016)   Pneumonia 12/2015   RA (rheumatoid arthritis) ([place])    "all over" (07/03/2016)   Sickle cell trait ([place])    Stroke ([place]) 2014 X 1; 2015 X 2; 2016 X 1;    "right side of face more relaxed than the other; rare speech hesitation" (07/03/2016)   Vaginal Pap smear, abnormal    ASCUS; HPV     Family History  Problem Relation Age of Onset  Arthritis Mother    Heart murmur Mother    Drug abuse Mother    Allergies Mother    Heart attack Father    Cushing syndrome Father    Depression Father    Allergies Father    Dementia Paternal Grandmother    Cancer Paternal Grandfather    Diabetes Maternal Grandmother    Hypertension Maternal Grandmother    Asthma Maternal Grandmother    Heart attack Maternal Grandfather    Breast cancer Neg Hx      Current Outpatient Medications:    albuterol (ACCUNEB) 1.25 MG/3ML nebulizer solution, Take 3 mLs (1.25 mg total) by nebulization every 6 (six) hours as needed for  wheezing. (Patient taking differently: Take 1 ampule by nebulization 3 (three) times daily as needed for wheezing.), Disp: 75 mL, Rfl: 12   aspirin 81 MG chewable tablet, Chew 162 mg by mouth daily. Taking 2 tablets, Disp: , Rfl:    atorvastatin (LIPITOR) 10 MG tablet, Take 1 tablet (10 mg total) by mouth daily., Disp: 90 tablet, Rfl: 2   budesonide-formoterol (SYMBICORT) 80-4.5 MCG/ACT inhaler, Inhale 2 puffs into the lungs in the morning and at bedtime., Disp: 1 each, Rfl: 5   carvedilol (COREG) 25 MG tablet, TAKE ONE TABLET BY MOUTH EVERY MORNING and TAKE ONE TABLET BY MOUTH EVERY EVENING, Disp: 60 tablet, Rfl: 6   Continuous Blood Gluc Receiver (DEXCOM G6 RECEIVER) DEVI, 1 Device by Does not apply route 3 (three) times daily before meals., Disp: 1 each, Rfl: 1   Continuous Blood Gluc Sensor (DEXCOM G6 SENSOR) MISC, Inject 1 each into the skin 3 (three) times daily., Disp: 3 each, Rfl: 1   Continuous Blood Gluc Transmit (DEXCOM G6 TRANSMITTER) MISC, 1 Device by Does not apply route 3 (three) times daily before meals., Disp: 1 each, Rfl: 1   folic acid (FOLVITE) 1 MG tablet, TAKE 1 TABLET BY MOUTH EVERY MORNING, Disp: 30 tablet, Rfl: 0   Glucagon (GVOKE HYPOPEN 2-PACK) 1 MG/0.2ML SOAJ, Inject 1 mg into the skin as needed., Disp: 0.2 mL, Rfl: 5   hydroxychloroquine (PLAQUENIL) 200 MG tablet, Take 1 tablet (200 mg total) by mouth 2 (two) times daily., Disp: 180 tablet, Rfl: 0   insulin lispro (HUMALOG KWIKPEN) 100 UNIT/ML KwikPen, Use according to sliding scale, Disp: 15 mL, Rfl: 11   Insulin Pen Needle (NOVOFINE PLUS PEN NEEDLE) 32G X 4 MM MISC, Use with insulin pens dx code e11.65, Disp: 300 each, Rfl: 3   Magnesium 400 MG TABS, Take 1 tablet by mouth daily. , Disp: , Rfl:    meloxicam (MOBIC) 15 MG tablet, Take 15 mg by mouth daily. , Disp: , Rfl:    metFORMIN (GLUCOPHAGE) 500 MG tablet, TAKE ONE TABLET BY MOUTH TWICE DAILY, Disp: 180 tablet, Rfl: 1   methotrexate (RHEUMATREX) 2.5 MG tablet, Take  20 mg by mouth every Friday. , Disp: , Rfl: 3   metolazone (ZAROXOLYN) 2.5 MG tablet, TAKE 1 TABLET BY MOUTH AS NEEDED FOR WEIGHT GAIN OF 5 POUNDS WITHIN 3 DAYS AS DIRECTED needs appt for further refills, Disp: 5 tablet, Rfl: 3   montelukast (SINGULAIR) 10 MG tablet, TAKE ONE TABLET BY MOUTH EVERY MORNING, Disp: 90 tablet, Rfl: 1   Multiple Vitamin (MULTIVITAMIN WITH MINERALS) TABS, Take 1 tablet by mouth every morning. , Disp: , Rfl:    ondansetron (ZOFRAN-ODT) 4 MG disintegrating tablet, DISSOLVE ONE TABLET UNDER THE TONGUE EVERY TWELVE HOURS AS NEEDED, Disp: 20 tablet, Rfl: 1   PARoxetine (PAXIL)  10 MG tablet, TAKE ONE TABLET BY MOUTH EVERY MORNING, Disp: 30 tablet, Rfl: 2   potassium chloride SA (KLOR-CON) 20 MEQ tablet, TAKE ONE TABLET BY MOUTH EVERY MORNING and TAKE ONE TABLET BY MOUTH EVERY EVENING, Disp: 60 tablet, Rfl: 6   predniSONE (DELTASONE) 10 MG tablet, Take 4 tabs for 2 days, then 3 tabs for 2 days, then 2 tabs for 2 days, then 1 tab for 2 days, then stop, Disp: 20 tablet, Rfl: 0   PROAIR HFA 108 (90 Base) MCG/ACT inhaler, INHALE 2 PUFFS BY MOUTH EVERY DAY AS NEEDED FOR SHORTNESS OF BREATH, Disp: 18 g, Rfl: 2   sacubitril-valsartan (ENTRESTO) 97-103 MG, Take 1 tablet by mouth 2 (two) times daily. Needs appt, Disp: 180 tablet, Rfl: 0   spironolactone (ALDACTONE) 25 MG tablet, TAKE ONE TABLET BY MOUTH EVERY MORNING and TAKE ONE TABLET BY MOUTH EVERY EVENING last refill without office visit please call [phone], Disp: 60 tablet, Rfl: 0   torsemide (DEMADEX) 20 MG tablet, TAKE ONE TABLET BY MOUTH WEEKLY ON monday, wednesday, AND Friday, Disp: 15 tablet, Rfl: 6   Vitamin D, Ergocalciferol, (DRISDOL) 1.25 MG (50000 UNIT) CAPS capsule, TAKE ONE CAPSULE BY MOUTH ONCE WEEKLY ON TUESDAY and ONCE WEEKLY ON FRIDAY, Disp: 8 capsule, Rfl: 0   hydrALAZINE (APRESOLINE) 25 MG tablet, Take 1 tablet (25 mg total) by mouth 3 (three) times daily. Please schedule appointment for future refills. Thank you,  Disp: 90 tablet, Rfl: 0   KINERET 100 MG/0.67ML SOSY injection, Inject into the skin., Disp: , Rfl:    traZODone (DESYREL) 50 MG tablet, Take 50 mg by mouth at bedtime as needed., Disp: , Rfl:    Allergies  Allergen Reactions   Hydrocodone-Acetaminophen Nausea And Vomiting   Hydromorphone Nausea And Vomiting    Other reaction(s): GI Upset (intolerance), Hypertension (intolerance) Raises blood pressure  Other reaction(s): GI Upset (intolerance), Hypertension (intolerance) Raises blood pressure to stroke level   Iodinated Diagnostic Agents Other (See Comments)    Shuts down kidneys Shuts kidney function down   Other Other (See Comments) and Anaphylaxis    Spicy foods and seasonings Skin Prep "makes my skin peel off" Paper tape causes skin burns   Erythromycin Nausea And Vomiting   Latex Hives   Rinvoq [Upadacitinib] Other (See Comments)   Farxiga [Dapagliflozin] Other (See Comments)   Tape Other (See Comments)    "Skin burns"   Mircette [Desogestrel-Ethinyl Estradiol] Nausea And Vomiting and Rash     Review of Systems  Constitutional: Negative.   Respiratory: Negative.    Cardiovascular: Negative.   Gastrointestinal: Negative.   Endocrine: Negative for polydipsia, polyphagia and polyuria.  Neurological: Negative.   Psychiatric/Behavioral:  Negative for confusion. The patient is not nervous/anxious.     Today's Vitals   07/25/21 1106  BP: 138/70  Pulse: 74  Temp: 98.7 F (37.1 C)  TempSrc: Oral  Weight: 190 lb (86.2 kg)  Height: '5\' 6"'$  (1.676 m)   Body mass index is 30.67 kg/m.  Wt Readings from Last 3 Encounters:  07/25/21 190 lb (86.2 kg)  04/12/21 180 lb (81.6 kg)  04/11/21 184 lb 3.2 oz (83.6 kg)    Objective:  Physical Exam Constitutional:      General: She is not in acute distress.    Appearance: Normal appearance.  Cardiovascular:     Rate and Rhythm: Normal rate and regular rhythm.     Pulses: Normal pulses.     Heart sounds: No murmur  heard. Pulmonary:  Effort: Pulmonary effort is normal. No respiratory distress.     Breath sounds: Normal breath sounds. No wheezing.  Skin:    Capillary Refill: Capillary refill takes less than 2 seconds.  Neurological:     General: No focal deficit present.     Mental Status: She is alert and oriented to person, place, and time.     Cranial Nerves: No cranial nerve deficit.     Motor: No weakness.  Psychiatric:        Mood and Affect: Mood normal.        Behavior: Behavior normal.        Thought Content: Thought content normal.        Judgment: Judgment normal.        Assessment And Plan:     1. Type 2 diabetes mellitus with other specified complication, without long-term current use of insulin (HCC) Chronic, she is back on prednisone and her blood sugars typically fluctuate when this happens. She is checking her blood sugar 4 times a day and may take insulin up to 4 times a day. She is using a Dexcom at this time - POCT Urinalysis Dipstick (81002) - Hemoglobin A1c  2. Essential hypertension Chronic, continue follow up with Cardiology  3. Class 1 obesity due to excess calories with serious comorbidity and body mass index (BMI) of 30.0 to 30.9 in adult Chronic Discussed healthy diet and regular exercise options  Encouraged to exercise at least 150 minutes per week with 2 days of strength training She is encouraged to strive for BMI less than 30 to decrease cardiac risk.   4. Systemic lupus erythematosus, unspecified SLE type, unspecified organ involvement status ([place]) Continue follow up with Rheumatology She is on prednisone daily at a higher dose  5. Encounter for screening for malignant neoplasm of colon According to USPTF Colorectal cancer Screening guidelines. Colonoscopy or cologuard is recommended every 10 years, starting at age 28 years. Will refer to GI for colon cancer screening. - Cologuard  6. Encounter for immunization - Zoster Vaccine Adjuvanted  [facility]) injection; Inject 0.5 mLs into the muscle once for 1 dose.  Dispense: 1 mL; Refill: 1   Patient was given opportunity to ask questions. Patient verbalized understanding of the plan and was able to repeat key elements of the plan. All questions were answered to their satisfaction.  [name], FNP   I, [name], FNP, have reviewed all documentation for this visit. The documentation on 07/25/21 for the exam, diagnosis, procedures, and orders are all accurate and complete.   IF YOU HAVE BEEN REFERRED TO A SPECIALIST, IT MAY TAKE 1-2 WEEKS TO SCHEDULE/PROCESS THE REFERRAL. IF YOU HAVE NOT HEARD FROM US/SPECIALIST IN TWO WEEKS, PLEASE GIVE [place] A CALL AT [phone].   THE PATIENT IS ENCOURAGED TO PRACTICE SOCIAL DISTANCING DUE TO THE COVID-19 PANDEMIC.

## 2021-07-25 NOTE — Patient Instructions (Signed)
Diabetes Mellitus Basics  Diabetes mellitus, or diabetes, is a long-term (chronic) disease. It occurs when the body does not properly use sugar (glucose) that is released from food after you eat. Diabetes mellitus may be caused by one or both of these problems: Your pancreas does not make enough of a hormone called insulin. Your body does not react in a normal way to the insulin that it makes. Insulin lets glucose enter cells in your body. This gives you energy. If you have diabetes, glucose cannot get into cells. This causes high blood glucose (hyperglycemia). How to treat and manage diabetes You may need to take insulin or other diabetes medicines daily to keep your glucose in balance. If you are prescribed insulin, you will learn how to give yourself insulin by injection. You may need to adjust the amount of insulin youtake based on the foods that you eat. You will need to check your blood glucose levels using a glucose monitor as told by your health care provider. The readings can help determine if you havelow or high blood glucose. Generally, you should have these blood glucose levels: Before meals (preprandial): 80-130 mg/dL (4.4-7.2 mmol/L). After meals (postprandial): below 180 mg/dL (10 mmol/L). Hemoglobin A1c (HbA1c) level: less than 7%. Your health care provider will set treatment goals for you. Keep all follow-up visits. This is important. Follow these instructions at home: Diabetes medicines Take your diabetes medicines every day as told by your health care provider. List your diabetes medicines here: Name of medicine: ______________________________ Amount (dose): _______________ Time (a.m./p.m.): _______________ Notes: ___________________________________ Name of medicine: ______________________________ Amount (dose): _______________ Time (a.m./p.m.): _______________ Notes: ___________________________________ Name of medicine: ______________________________ Amount (dose):  _______________ Time (a.m./p.m.): _______________ Notes: ___________________________________ Insulin If you use insulin, list the types of insulin you use here: Insulin type: ______________________________ Amount (dose): _______________ Time (a.m./p.m.): _______________Notes: ___________________________________ Insulin type: ______________________________ Amount (dose): _______________ Time (a.m./p.m.): _______________ Notes: ___________________________________ Insulin type: ______________________________ Amount (dose): _______________ Time (a.m./p.m.): _______________ Notes: ___________________________________ Insulin type: ______________________________ Amount (dose): _______________ Time (a.m./p.m.): _______________ Notes: ___________________________________ Insulin type: ______________________________ Amount (dose): _______________ Time (a.m./p.m.): _______________ Notes: ___________________________________ Managing blood glucose  Check your blood glucose levels using a glucose monitor as told by your healthcare provider. Write down the times that you check your glucose levels here: Time: _______________ Notes: ___________________________________ Time: _______________ Notes: ___________________________________ Time: _______________ Notes: ___________________________________ Time: _______________ Notes: ___________________________________ Time: _______________ Notes: ___________________________________ Time: _______________ Notes: ___________________________________  Low blood glucose Low blood glucose (hypoglycemia) is when glucose is at or below 70 mg/dL (3.9 mmol/L). Symptoms may include: Feeling: Hungry. Sweaty and clammy. Irritable or easily upset. Dizzy. Sleepy. Having: A fast heartbeat. A headache. A change in your vision. Numbness around the mouth, lips, or tongue. Having trouble with: Moving (coordination). Sleeping. Treating low blood glucose To treat low blood  glucose, eat or drink something containing sugar right away. If you can think clearly and swallow safely, follow the 15:15 rule: Take 15 grams of a fast-acting carb (carbohydrate), as told by your health care provider. Some fast-acting carbs are: Glucose tablets: take 3-4 tablets. Hard candy: eat 3-5 pieces. Fruit juice: drink 4 oz (120 mL). Regular (not diet) soda: drink 4-6 oz (120-180 mL). Honey or sugar: eat 1 Tbsp (15 mL). Check your blood glucose levels 15 minutes after you take the carb. If your glucose is still at or below 70 mg/dL (3.9 mmol/L), take 15 grams of a carb again. If your glucose does not go above 70 mg/dL (3.9 mmol/L) after 3 tries, get  help right away. After your glucose goes back to normal, eat a meal or a snack within 1 hour. Treating very low blood glucose If your glucose is at or below 54 mg/dL (3 mmol/L), you have very low blood glucose (severe hypoglycemia). This is an emergency. Do not wait to see if the symptoms will go away. Get medical help right away. Call your local emergency services (911 in the [place] Do not drive yourself to the hospital. Questions to ask your health care provider Should I talk with a diabetes educator? What equipment will I need to care for myself at home? What diabetes medicines do I need? When should I take them? How often do I need to check my blood glucose levels? What number can I call if I have questions? When is my follow-up visit? Where can I find a support group for people with diabetes? Where to find more information American Diabetes Association: www.diabetes.org Association of Diabetes Care and Education Specialists: www.diabeteseducator.org Contact a health care provider if: Your blood glucose is at or above 240 mg/dL (13.3 mmol/L) for 2 days in a row. You have been sick or have had a fever for 2 days or more, and you are not getting better. You have any of these problems for more than 6 hours: You cannot eat or  drink. You feel nauseous. You vomit. You have diarrhea. Get help right away if: Your blood glucose is lower than 54 mg/dL (3 mmol/L). You get confused. You have trouble thinking clearly. You have trouble breathing. These symptoms may represent a serious problem that is an emergency. Do not wait to see if the symptoms will go away. Get medical help right away. Call your local emergency services (911 in the [place] Do not drive yourself to the hospital. Summary Diabetes mellitus is a chronic disease that occurs when the body does not properly use sugar (glucose) that is released from food after you eat. Take insulin and diabetes medicines as told. Check your blood glucose every day, as often as told. Keep all follow-up visits. This is important. This information is not intended to replace advice given to you by your health care provider. Make sure you discuss any questions you have with your healthcare provider. Document Revised: 04/11/2020 Document Reviewed: 04/11/2020 Elsevier Patient Education  [employer].

## 2021-07-26 DIAGNOSIS — E119 Type 2 diabetes mellitus without complications: Secondary | ICD-10-CM | POA: Diagnosis not present

## 2021-07-26 DIAGNOSIS — Z794 Long term (current) use of insulin: Secondary | ICD-10-CM | POA: Diagnosis not present

## 2021-07-26 LAB — HEMOGLOBIN A1C
Est. average glucose Bld gHb Est-mCnc: 146 mg/dL
Hgb A1c MFr Bld: 6.7 % — ABNORMAL HIGH (ref 4.8–5.6)

## 2021-08-01 DIAGNOSIS — M25461 Effusion, right knee: Secondary | ICD-10-CM | POA: Diagnosis not present

## 2021-08-01 DIAGNOSIS — T8484XD Pain due to internal orthopedic prosthetic devices, implants and grafts, subsequent encounter: Secondary | ICD-10-CM | POA: Diagnosis not present

## 2021-08-01 DIAGNOSIS — Z471 Aftercare following joint replacement surgery: Secondary | ICD-10-CM | POA: Diagnosis not present

## 2021-08-01 DIAGNOSIS — Z96651 Presence of right artificial knee joint: Secondary | ICD-10-CM | POA: Diagnosis not present

## 2021-08-02 DIAGNOSIS — R7989 Other specified abnormal findings of blood chemistry: Secondary | ICD-10-CM | POA: Diagnosis not present

## 2021-08-09 ENCOUNTER — Encounter: Payer: Self-pay | Admitting: Nurse Practitioner

## 2021-08-10 DIAGNOSIS — T8484XD Pain due to internal orthopedic prosthetic devices, implants and grafts, subsequent encounter: Secondary | ICD-10-CM | POA: Diagnosis not present

## 2021-08-10 DIAGNOSIS — Z96651 Presence of right artificial knee joint: Secondary | ICD-10-CM | POA: Diagnosis not present

## 2021-08-10 DIAGNOSIS — E118 Type 2 diabetes mellitus with unspecified complications: Secondary | ICD-10-CM | POA: Diagnosis not present

## 2021-08-10 DIAGNOSIS — M2012 Hallux valgus (acquired), left foot: Secondary | ICD-10-CM | POA: Diagnosis not present

## 2021-08-10 DIAGNOSIS — M069 Rheumatoid arthritis, unspecified: Secondary | ICD-10-CM | POA: Diagnosis not present

## 2021-08-10 DIAGNOSIS — M25461 Effusion, right knee: Secondary | ICD-10-CM | POA: Diagnosis not present

## 2021-08-10 DIAGNOSIS — M3219 Other organ or system involvement in systemic lupus erythematosus: Secondary | ICD-10-CM | POA: Diagnosis not present

## 2021-08-10 DIAGNOSIS — T8484XA Pain due to internal orthopedic prosthetic devices, implants and grafts, initial encounter: Secondary | ICD-10-CM | POA: Diagnosis not present

## 2021-08-11 DIAGNOSIS — E1165 Type 2 diabetes mellitus with hyperglycemia: Secondary | ICD-10-CM | POA: Diagnosis not present

## 2021-08-13 ENCOUNTER — Ambulatory Visit (INDEPENDENT_AMBULATORY_CARE_PROVIDER_SITE_OTHER): Payer: Medicare Other

## 2021-08-13 DIAGNOSIS — I5022 Chronic systolic (congestive) heart failure: Secondary | ICD-10-CM

## 2021-08-13 DIAGNOSIS — Z95810 Presence of automatic (implantable) cardiac defibrillator: Secondary | ICD-10-CM | POA: Diagnosis not present

## 2021-08-15 NOTE — Progress Notes (Signed)
EPIC Encounter for ICM Monitoring  Patient Name: Christine Mcdowell is a 50 y.o. female Date: 08/15/2021 Primary Care Physican: [name], [place] Primary Cardiologist: [place] Electrophysiologist: Lovena Le Last Weight: 184 lbs                                                                               Transmission reviewed.    08/12/2021 [employer] Heart Failure Index 0 suggesting normal fluid levels.    Prescribed:  Torsemide 20 mg take 1 tablet (20 mg total) Mon, Wed and Friday.  Per Dr Bensimhon's 08/21/2020 note Can take extra torsemide or metolazone prn as needed Potassium 20 mEq take 1 tablet twice a day daily. Spironolactone 25 mg take 1 tablet twice a day. Metolazone 2.5 mg take one tablet as needed for weight gain of 5 lbs within 3 days.     Labs: 04/12/2021 Creatinine 1.08, BUN 18, Potassium 3.1, Sodium 140, GFR >60 A complete set of results can be found in Results Review.   Recommendations:  No changes     Follow-up plan: ICM clinic phone appointment on 09/24/2021.  91 day device clinic remote transmission 08/29/2021.           EP/Cardiology next office visit:  Recall for 09/01/2021 with Dr. Lovena Le        Copy of ICM check sent to Dr. Lovena Le.  3 Month Trend    8 Day Data Trend          [name], RN 08/15/2021 1:12 PM

## 2021-08-21 ENCOUNTER — Encounter (HOSPITAL_COMMUNITY): Payer: Self-pay

## 2021-08-28 ENCOUNTER — Other Ambulatory Visit (HOSPITAL_COMMUNITY): Payer: Self-pay | Admitting: Internal Medicine

## 2021-08-28 ENCOUNTER — Other Ambulatory Visit: Payer: Self-pay | Admitting: Internal Medicine

## 2021-08-29 ENCOUNTER — Ambulatory Visit (INDEPENDENT_AMBULATORY_CARE_PROVIDER_SITE_OTHER): Payer: Medicare Other

## 2021-08-29 DIAGNOSIS — I420 Dilated cardiomyopathy: Secondary | ICD-10-CM | POA: Diagnosis not present

## 2021-08-29 LAB — CUP PACEART REMOTE DEVICE CHECK
Battery Remaining Longevity: 150 mo
Battery Remaining Percentage: 100 %
Brady Statistic RV Percent Paced: 0 %
Date Time Interrogation Session: 20220907045100
HighPow Impedance: 73 Ohm
Implantable Lead Implant Date: 20190206
Implantable Lead Location: 753860
Implantable Lead Model: 292
Implantable Lead Serial Number: 438194
Implantable Pulse Generator Implant Date: 20190206
Lead Channel Impedance Value: 546 Ohm
Lead Channel Setting Pacing Amplitude: 2.5 V
Lead Channel Setting Pacing Pulse Width: 0.4 ms
Lead Channel Setting Sensing Sensitivity: 0.5 mV
Pulse Gen Serial Number: 243572

## 2021-08-29 NOTE — Progress Notes (Signed)
Advanced Heart Failure Clinic Note   Date:  08/30/2021   ID:  Tommye Chudy, DOB Jun 22, 1971, MRN [id number]  Location: Home  Provider location: [facility] Type of Visit: Established patient  PCP:  [name], FNP  EP: Dr. 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    Admitted in October 2017 with increased dyspnea and volume overload. Diuresed with IV lasix and transitioned to lasix 40 mg twice a day. L/RHC normal filling pressures, EF ~20%, and normal coronaries. Discharge weight 184 pounds.   Cardiac MRI in 02/2015 showed EF 46%. No LGE.   Echo 02/12/19 EF: 40-45%, RV normal.    CPX 9/19 with relaltively normal spirometry. Mild HF limitation.    Had R TKA 3/20.   Had Covid-19 infection in 3/21 with COIVD PNA. Received infusion but not hospitalized. Seen by [facility].   Echo 8/21: EF ~ 40%-45%, Grade II DD, moderate MR.  She was COVID + 4/22 w/ COVID PNA.   Today she returns for HF follow up and to discuss her elevated BP readings at home. Has a lot of life stressors right now, including possible home eviction. She was last seen in clinic 8/21. BP at home 160s/100s for past 2 weeks, LE swelling started around this time. Took metolazone 2x last week with relief but swelling quickly recurred. SOB now with minimal activity. Has chest pressure and feels palpitations at night. + new 3-4 pillow orthopnea. Appetite ok. She cooks her meals at home, drinks <2L fluid/daily. Weight at home 192 pounds. Taking all medications. Planning on right knee replacement revision & left bunionectomy soon.  Past Medical History:  Diagnosis Date   AICD  (automatic cardioverter/defibrillator) present 01/28/2018   Anemia    Anginal pain (HCC)    Asthma    Cervical cancer ([place])    cervical 1996   CHF (congestive heart failure) ([place])    Diabetes mellitus without complication ([place])    steroid induced   Discoid lupus    Fibromyalgia    History of blood transfusion "several"   "related to anemia; had some w/hysterectomy also"   Hx of cardiovascular stress test    ETT-Myoview (9/15):  No ischemia, EF 52%; NORMAL   Hx of echocardiogram    Echo (9/15):  EF 50-55%, ant HK, Gr 1 DD, mild MR, mild LAE, no effusion   Hypertension    Iron deficiency anemia    h/o iron transfusions   Lupus (systemic lupus erythematosus) ([place])    Migraine    "a few/year" (07/03/2016)   Pneumonia 12/2015   RA (rheumatoid arthritis) ([place])    "all over" (07/03/2016)   Sickle cell trait ([place])    Stroke ([place]) 2014 X 1; 2015 X 2; 2016 X 1;    "right side of face more relaxed than the other; rare speech hesitation" (07/03/2016)   Vaginal Pap smear, abnormal    ASCUS; HPV   Past Surgical History:  Procedure Laterality Date   ABDOMINAL HYSTERECTOMY  2009   ABDOMINAL WOUND DEHISCENCE  2009   BUNIONECTOMY Left 06/15/2020   CARDIAC CATHETERIZATION N/A 10/11/2016   Procedure: Right/Left Heart Cath and Coronary Angiography;  Surgeon: Jolaine Artist, MD;  Location: [facility]  CV [facility];  Service: Cardiovascular;  Laterality: N/A;   DILATION AND CURETTAGE OF UTERUS  1991   HEMATOMA EVACUATION  2009   abdomen   ICD IMPLANT  01/28/2018   ICD IMPLANT N/A 01/28/2018   Procedure: ICD IMPLANT;  Surgeon: Evans Lance, MD;  Location: [facility];  Service: Cardiovascular;  Laterality: N/A;   INCISE AND DRAIN ABCESS  2009 X 2   "abdomen after hysterectomy"   KNEE ARTHROSCOPY Right 1997   KNEE SURGERY Right    TUBAL LIGATION  1996   Current Outpatient Medications  Medication Sig Dispense Refill   albuterol (ACCUNEB) 1.25 MG/3ML nebulizer solution Take 3 mLs (1.25 mg  total) by nebulization every 6 (six) hours as needed for wheezing. (Patient taking differently: Take 1 ampule by nebulization 3 (three) times daily as needed for wheezing.) 75 mL 12   aspirin 81 MG chewable tablet Chew 162 mg by mouth daily. Taking 2 tablets     atorvastatin (LIPITOR) 10 MG tablet Take 1 tablet (10 mg total) by mouth daily. 90 tablet 2   budesonide-formoterol (SYMBICORT) 80-4.5 MCG/ACT inhaler Inhale 2 puffs into the lungs in the morning and at bedtime. 1 each 5   carvedilol (COREG) 25 MG tablet TAKE ONE TABLET BY MOUTH EVERY MORNING and TAKE ONE TABLET BY MOUTH EVERY EVENING 60 tablet 6   Continuous Blood Gluc Receiver (DEXCOM G6 RECEIVER) DEVI 1 Device by Does not apply route 3 (three) times daily before meals. 1 each 1   Continuous Blood Gluc Sensor (DEXCOM G6 SENSOR) MISC Inject 1 each into the skin 3 (three) times daily. 3 each 1   Continuous Blood Gluc Transmit (DEXCOM G6 TRANSMITTER) MISC 1 Device by Does not apply route 3 (three) times daily before meals. 1 each 1   ENTRESTO 97-103 MG TAKE ONE TABLET BY MOUTH TWICE DAILY 99991111 tablet 0   folic acid (FOLVITE) 1 MG tablet TAKE 1 TABLET BY MOUTH EVERY MORNING 30 tablet 0   Glucagon (GVOKE HYPOPEN 2-PACK) 1 MG/0.2ML SOAJ Inject 1 mg into the skin as needed. 0.2 mL 5   hydrALAZINE (APRESOLINE) 25 MG tablet Take 1 tablet (25 mg total) by mouth 3 (three) times daily. Please schedule appointment for future refills. Thank you 90 tablet 0   hydroxychloroquine (PLAQUENIL) 200 MG tablet Take 1 tablet (200 mg total) by mouth 2 (two) times daily. 180 tablet 0   insulin lispro (HUMALOG KWIKPEN) 100 UNIT/ML KwikPen Use according to sliding scale 15 mL 11   Insulin Pen Needle (NOVOFINE PLUS PEN NEEDLE) 32G X 4 MM MISC Use with insulin pens dx code e11.65 300 each 3   Magnesium 400 MG TABS Take 1 tablet by mouth daily.      meloxicam (MOBIC) 15 MG tablet Take 15 mg by mouth daily.      metFORMIN (GLUCOPHAGE) 500 MG tablet TAKE ONE TABLET BY  MOUTH TWICE DAILY 180 tablet 1   methotrexate (RHEUMATREX) 2.5 MG tablet Take 20 mg by mouth every Friday.   3   metolazone (ZAROXOLYN) 2.5 MG tablet TAKE 1 TABLET BY MOUTH AS NEEDED FOR WEIGHT GAIN OF 5 POUNDS WITHIN 3 DAYS AS DIRECTED needs appt for further refills 5 tablet 3   montelukast (SINGULAIR) 10 MG tablet TAKE ONE TABLET BY MOUTH EVERY MORNING 90 tablet 1   Multiple Vitamin (MULTIVITAMIN WITH MINERALS) TABS Take 1 tablet by mouth every morning.      ondansetron (ZOFRAN-ODT) 4 MG disintegrating tablet DISSOLVE ONE TABLET UNDER THE  TONGUE EVERY TWELVE HOURS AS NEEDED 20 tablet 1   PARoxetine (PAXIL) 10 MG tablet TAKE ONE TABLET BY MOUTH EVERY MORNING 30 tablet 2   potassium chloride SA (KLOR-CON) 20 MEQ tablet TAKE ONE TABLET BY MOUTH EVERY MORNING and TAKE ONE TABLET BY MOUTH EVERY EVENING 60 tablet 6   predniSONE (DELTASONE) 20 MG tablet Take 20 mg by mouth daily with breakfast.     PROAIR HFA 108 (90 Base) MCG/ACT inhaler INHALE 2 PUFFS BY MOUTH EVERY DAY AS NEEDED FOR SHORTNESS OF BREATH 18 g 2   spironolactone (ALDACTONE) 25 MG tablet TAKE ONE TABLET BY MOUTH EVERY MORNING and TAKE ONE TABLET BY MOUTH EVERY EVENING last refill without office visit please call [phone] 60 tablet 0   torsemide (DEMADEX) 20 MG tablet TAKE ONE TABLET BY MOUTH WEEKLY ON monday, wednesday, AND Friday 15 tablet 6   traZODone (DESYREL) 50 MG tablet Take 50 mg by mouth at bedtime as needed.     Vitamin D, Ergocalciferol, (DRISDOL) 1.25 MG (50000 UNIT) CAPS capsule TAKE ONE CAPSULE BY MOUTH ONCE WEEKLY ON TUESDAY and TAKE ONE CAPSULE BY MOUTH ONCE WEEKLY ON FRIDAY 8 capsule 0   No current facility-administered medications for this encounter.   Allergies:   Hydrocodone-acetaminophen, Hydromorphone, Iodinated diagnostic agents, Other, Erythromycin, Latex, Rinvoq [upadacitinib], Farxiga [dapagliflozin], Tape, and Mircette [desogestrel-ethinyl estradiol]   Social History:  The patient  reports that she has  never smoked. She has never used smokeless tobacco. She reports that she does not drink alcohol and does not use drugs.   Family History:  The patient's family history includes Allergies in her father and mother; Arthritis in her mother; Asthma in her maternal grandmother; Cancer in her paternal grandfather; Cushing syndrome in her father; Dementia in her paternal grandmother; Depression in her father; Diabetes in her maternal grandmother; Drug abuse in her mother; Heart attack in her father and maternal grandfather; Heart murmur in her mother; Hypertension in her maternal grandmother.   ROS:  Please see the history of present illness.   All other systems are personally reviewed and negative.   Recent Labs: 08/30/2020: ALT 14 04/12/2021: BUN 18; Creatinine, Ser 1.08; Hemoglobin 10.7; Platelets 187; Potassium 3.1; Sodium 140  Personally reviewed   Wt Readings from Last 3 Encounters:  08/30/21 86.9 kg (191 lb 9.6 oz)  07/25/21 86.2 kg (190 lb)  04/12/21 81.6 kg (180 lb)    BP (!) 168/100   Pulse 79   Wt 86.9 kg (191 lb 9.6 oz)   SpO2 100%   BMI 30.93 kg/m   Physical Exam: General:  NAD. No resp difficulty. HEENT: Normal Neck: Supple. JVP 7-8 Carotids 2+ bilat; no bruits. No lymphadenopathy or thryomegaly appreciated. Cor: PMI nondisplaced. Regular rate & rhythm. No rubs, gallops or murmurs. Lungs: Clear Abdomen: Soft, nontender, nondistended. No hepatosplenomegaly. No bruits or masses. Good bowel sounds. Extremities: No cyanosis, clubbing, rash, 2+ BLE edema Neuro: Alert & oriented x 3, cranial nerves grossly intact. Moves all 4 extremities w/o difficulty. Affect pleasant.  ECG: SR 73 bpm, largely unchanged from prior tracing (personally reviewed).  Device Interrogation: HL Score 10, 2.5 hrs/day activity, no shocks (personally reviewed).  ASSESSMENT AND PLAN:  1. Chronic Systolic Heart Failure - NICM.  Cath 10/11/16 with normal cors.  - cMRI in 02/2015 showed EF 46%. No LGE. - CPX  10/2016: Peak VO2: 18.5 (82% predicted peak VO2), VE/VCO2 slope: 30.  - Echo12/18 EF 25-30%  - S/P [employer] HeartLogic ICD 01/2018.  -  Repeat CPX 9/19 with mild HR limitation. - Echo 02/12/19: EF 40-45% - Echo 8/21: EF ~ 40%-45%, Grade II DD, moderate MR. - NYHA IIIb. Volume up on exam and HL score 10. - Increase torsemide to 40 mg daily + extra 20 mEq of KCl. - Continue spiro 25 mg daily. - Continue Coreg 25 mg bid. - Continue Entresto 97/103 mg bid.  - Off Farxiga 10 due to recurrent yeast infections. - Continue hydralazine 100 mg tid. Not on Imdur due to headaches.  - She had been consented for cardiomems, but will plan to monitor her with HeartLogic device for now unless she has frequent hospitalizations for CHF. - Will ask device RN to send transmission in 1 week to follow fluid. - Will update echo. - BMET and BNP today; repeat BMET in 10 days.  2. HTN  - Elevated. Diurese as above. - Continue to check BP at home and bring log to next visit. - Consider addition on amlodipine + referral to HTN clinic if not at goal after diuresis. - Needs updated sleep study.  3. Sinus tach vs atrial flutter on ICD - Zio patch 9/20 Rare PVCs and PAcs. - Palpitations at night, ? Fluid overload. - No VT on ICD - Consider repeat Zio if no improvement after diuresis.  4. OSA - Not wearing CPAP.  - Needs updated sleep study. - Will arrange at next visit.  5. DM2  - Per PCP. She is using a Dexcom. - Off Farxiga due to recurrent yeast infections.  6. SDOH - Have asked HFSW to discuss housing issues with patient and see if we could offer support services  Follow back with APP in 2 weeks (before her planned ortho surgery). If BP not at goal, plan to add amlodipine. Plan to update echo and sleep study.  Frankey Poot, FNP  08/30/2021 9:19 AM  Advanced Heart Failure [address] [phone]  (office) [phone] (fax)

## 2021-08-30 ENCOUNTER — Encounter (HOSPITAL_COMMUNITY): Payer: Self-pay

## 2021-08-30 ENCOUNTER — Other Ambulatory Visit: Payer: Self-pay

## 2021-08-30 ENCOUNTER — Ambulatory Visit (HOSPITAL_COMMUNITY)
Admission: RE | Admit: 2021-08-30 | Discharge: 2021-08-30 | Disposition: A | Discharge status: Home / Self Care | Payer: Medicare Other | Source: Ambulatory Visit | Attending: Family Medicine | Admitting: Family Medicine

## 2021-08-30 VITALS — BP 168/100 | HR 79 | Wt 191.6 lb

## 2021-08-30 DIAGNOSIS — Z8616 Personal history of COVID-19: Secondary | ICD-10-CM | POA: Diagnosis not present

## 2021-08-30 DIAGNOSIS — I5020 Unspecified systolic (congestive) heart failure: Secondary | ICD-10-CM | POA: Diagnosis not present

## 2021-08-30 DIAGNOSIS — I110 Hypertensive heart disease with heart failure: Secondary | ICD-10-CM | POA: Diagnosis not present

## 2021-08-30 DIAGNOSIS — Z09 Encounter for follow-up examination after completed treatment for conditions other than malignant neoplasm: Secondary | ICD-10-CM | POA: Diagnosis not present

## 2021-08-30 DIAGNOSIS — G4733 Obstructive sleep apnea (adult) (pediatric): Secondary | ICD-10-CM | POA: Diagnosis not present

## 2021-08-30 DIAGNOSIS — Z7952 Long term (current) use of systemic steroids: Secondary | ICD-10-CM | POA: Diagnosis not present

## 2021-08-30 DIAGNOSIS — Z794 Long term (current) use of insulin: Secondary | ICD-10-CM | POA: Diagnosis not present

## 2021-08-30 DIAGNOSIS — Z79899 Other long term (current) drug therapy: Secondary | ICD-10-CM | POA: Insufficient documentation

## 2021-08-30 DIAGNOSIS — E118 Type 2 diabetes mellitus with unspecified complications: Secondary | ICD-10-CM | POA: Diagnosis not present

## 2021-08-30 DIAGNOSIS — R002 Palpitations: Secondary | ICD-10-CM | POA: Insufficient documentation

## 2021-08-30 DIAGNOSIS — Z8249 Family history of ischemic heart disease and other diseases of the circulatory system: Secondary | ICD-10-CM | POA: Insufficient documentation

## 2021-08-30 DIAGNOSIS — Z791 Long term (current) use of non-steroidal anti-inflammatories (NSAID): Secondary | ICD-10-CM | POA: Insufficient documentation

## 2021-08-30 DIAGNOSIS — M797 Fibromyalgia: Secondary | ICD-10-CM | POA: Diagnosis not present

## 2021-08-30 DIAGNOSIS — Z139 Encounter for screening, unspecified: Secondary | ICD-10-CM

## 2021-08-30 DIAGNOSIS — M7989 Other specified soft tissue disorders: Secondary | ICD-10-CM | POA: Diagnosis not present

## 2021-08-30 DIAGNOSIS — R0602 Shortness of breath: Secondary | ICD-10-CM | POA: Insufficient documentation

## 2021-08-30 DIAGNOSIS — R0789 Other chest pain: Secondary | ICD-10-CM | POA: Insufficient documentation

## 2021-08-30 DIAGNOSIS — I428 Other cardiomyopathies: Secondary | ICD-10-CM | POA: Insufficient documentation

## 2021-08-30 DIAGNOSIS — Z7951 Long term (current) use of inhaled steroids: Secondary | ICD-10-CM | POA: Diagnosis not present

## 2021-08-30 DIAGNOSIS — I5022 Chronic systolic (congestive) heart failure: Secondary | ICD-10-CM | POA: Insufficient documentation

## 2021-08-30 DIAGNOSIS — M329 Systemic lupus erythematosus, unspecified: Secondary | ICD-10-CM | POA: Diagnosis not present

## 2021-08-30 DIAGNOSIS — Z8673 Personal history of transient ischemic attack (TIA), and cerebral infarction without residual deficits: Secondary | ICD-10-CM | POA: Diagnosis not present

## 2021-08-30 DIAGNOSIS — I10 Essential (primary) hypertension: Secondary | ICD-10-CM

## 2021-08-30 DIAGNOSIS — Z95810 Presence of automatic (implantable) cardiac defibrillator: Secondary | ICD-10-CM | POA: Diagnosis not present

## 2021-08-30 DIAGNOSIS — Z7982 Long term (current) use of aspirin: Secondary | ICD-10-CM | POA: Insufficient documentation

## 2021-08-30 DIAGNOSIS — E119 Type 2 diabetes mellitus without complications: Secondary | ICD-10-CM | POA: Diagnosis not present

## 2021-08-30 LAB — BASIC METABOLIC PANEL
Anion gap: 9 (ref 5–15)
BUN: 12 mg/dL (ref 6–20)
CO2: 26 mmol/L (ref 22–32)
Calcium: 8.8 mg/dL — ABNORMAL LOW (ref 8.9–10.3)
Chloride: 106 mmol/L (ref 98–111)
Creatinine, Ser: 0.86 mg/dL (ref 0.44–1.00)
GFR, Estimated: >60 mL/min (ref >60)
Glucose, Bld: 123 mg/dL — ABNORMAL HIGH (ref 70–99)
Potassium: 3.4 mmol/L — ABNORMAL LOW (ref 3.5–5.1)
Sodium: 141 mmol/L (ref 135–145)

## 2021-08-30 LAB — BRAIN NATRIURETIC PEPTIDE: B Natriuretic Peptide: 50.9 pg/mL (ref 0.0–100.0)

## 2021-08-30 MED ORDER — TORSEMIDE 20 MG PO TABS: 40.0000 mg | ORAL_TABLET | Freq: Every day | ORAL | 3 refills | Status: DC

## 2021-08-30 MED ORDER — POTASSIUM CHLORIDE CRYS ER 10 MEQ PO TBCR: 30.0000 meq | EXTENDED_RELEASE_TABLET | Freq: Two times a day (BID) | ORAL | 3 refills | Status: DC

## 2021-08-30 NOTE — Addendum Note (Signed)
Encounter addended by: [name], LCSW on: 08/30/2021 11:27 AM  Actions taken: Flowsheet accepted, Clinical Note Signed

## 2021-08-30 NOTE — Progress Notes (Signed)
Heart and Vascular Care Navigation  08/30/2021  Christine Mcdowell August 05, 1971 [id number]  Reason for Referral: Housing concerns   Engaged with patient face to face for initial visit for Heart and Vascular Care Coordination.                                                                                                   Assessment:    CSW consulted to speak with pt regarding concerns with losing current housing.  Pt states that she thought she was in the process of purchasing her current residence where she lives with her 22 yo son but she found out that she was being scammed when she was told her apartment's mortgage was still under the name of the man she thought she purchased it from and that he had stopped paying the mortgage and the [facility] fees and now the apartment was at risk of being foreclosed on.  Pt has a lawyer who is helping her but the housing development won't speak with her as she is not the Metallurg[employer].  States she is speaking frequently with the owner of the complex and they do not plan to force her to leave since she continues to pay her rent ($900/month).  She was working with [place] [facility] housing and was on the top of their list and had a housing option but that her case manager died of COVID and the office has such a small staff that they have not been able to get to her case.   Also states she has been trying to get her case transferred to [facility] but that she can't get a hold of anyone at [employer].  Has continued to look into other housing options but doesn't have the funds to pay for security deposit or last month rent which a lot of renters are now requiring.                               HRT/VAS Care Coordination     Patients Home Cardiology Office Heart Failure Clinic   Living arrangements for the past 2 months Apartment   Lives with: Adult Children  6 y/o son   Patient Current Insurance Coverage Managed Medicare; Medicaid   Patient Has  Concern With Paying Medical Bills No   Does Patient Have Prescription Coverage? Yes   Home Assistive Devices/Equipment CPAP; Shower chair with back; Orthoptist       Social History:                                                                             SDOH Screenings   Alcohol Screen: Not on file  Depression (PHQ2-9): Low Risk    PHQ-2 Score: 2  Financial Resource Strain:  Medium Risk   Difficulty of Paying Living Expenses: Somewhat hard  Food Insecurity: No Food Insecurity   Worried About Running Out of Food in the Last Year: Never true   Ran Out of Food in the Last Year: Never true  Housing: Medium Risk   Last Housing Risk Score: 1  Physical Activity: Inactive   Days of Exercise per Week: 0 days   Minutes of Exercise per Session: 0 min  Social Connections: Not on file  Stress: Stress Concern Present   Feeling of Stress : Very much  Tobacco Use: Low Risk    Smoking Tobacco Use: Never   Smokeless Tobacco Use: Never  Transportation Needs: No Transportation Needs   Lack of Transportation (Medical): No   Lack of Transportation (Non-Medical): No    SDOH Interventions: Financial Resources:  Financial Strain Interventions: Other (Comment) (will help connect pt to resources/patient care fund if she finds available apartment)   Food Insecurity:   No concerns expressed  Housing Insecurity:  Housing Interventions: Other (Comment) (helping to connect with Section 8)  Transportation:    No concerns expressed     Follow-up plan:    Pt will continue to work on finding alternative housing and will discuss options with CSW to see if we can assist with starting costs  CSW will attempt to reach out to Section 8 workers with [place] [facility] to see if we can get pt into Section 8 housing option or at least get status update.  Will continue to follow and assist as needed  Christine Mcdowell, [facility] Desk#:  [phone] Cell#: [phone]

## 2021-08-30 NOTE — Patient Instructions (Signed)
INCREASE Torsemide to 40 mg daily INCREASE Potassium to 30 meq ( three 10 meq tabs) twice a day  Labs today We will only contact you if something comes back abnormal or we need to make some changes. Otherwise no news is good news!  Labs needed in 10-14 days  Your physician recommends that you schedule a follow-up appointment in: 10-14 days  in the Advanced Practitioners (PA/NP) Clinic   Your physician has requested that you have an echocardiogram. Echocardiography is a painless test that uses sound waves to create images of your heart. It provides your doctor with information about the size and shape of your heart and how well your heart's chambers and valves are working. This procedure takes approximately one hour. There are no restrictions for this procedure.   Do the following things EVERYDAY: Weigh yourself in the morning before breakfast. Write it down and keep it in a log. Take your medicines as prescribed Eat low salt foods--Limit salt (sodium) to 2000 mg per day.  Stay as active as you can everyday Limit all fluids for the day to less than 2 liters   At the [facility], you and your health needs are our priority. As part of our continuing mission to provide you with exceptional heart care, we have created designated Provider Care Teams. These Care Teams include your primary Cardiologist (physician) and Advanced Practice Providers (APPs- Physician Assistants and Nurse Practitioners) who all work together to provide you with the care you need, when you need it.   You may see any of the following providers on your designated Care Team at your next follow up: Dr Glori Bickers Dr Loralie Champagne Dr Patrice Paradise, NP Lyda Jester, [place] Ginnie Smart Audry Riles, PharmD   Please be sure to bring in all your medications bottles to every appointment.

## 2021-09-03 ENCOUNTER — Telehealth (HOSPITAL_COMMUNITY): Payer: Self-pay | Admitting: Licensed Clinical Social Worker

## 2021-09-03 NOTE — Telephone Encounter (Signed)
CSW called and spoke with [employer] but they don't have pt case on file- CSW left message for pt to clarify which office she has already applied with.  They were able to confirm that Section 8 credits can transfer to other counties.  CSW Educational psychologist with [employer] to see if she can assist with [place] contacting correct person to initiate this process- awaiting response  Will continue to follow and assist as needed  [name], [facility] Desk#: [phone] Cell#: [phone]

## 2021-09-04 NOTE — Progress Notes (Signed)
Electrophysiology Office Note Date: 09/05/2021  ID:  Christine Mcdowell, DOB 02/10/1971, MRN [id number]  PCP: [name], FNP Primary Cardiologist: Glori Bickers, MD Electrophysiologist: Cristopher Peru, MD   CC: Routine ICD follow-up  Christine Mcdowell is a 50 y.o. female seen today for Cristopher Peru, MD for routine electrophysiology followup.  Since last being seen in our clinic the patient reports doing about the same. She has had gradual worsening of her HF symptoms and is following closely with HF team. Potassium increased last week from 20 meq BID to 30 meq BID. She just received the increased dose in mail last night. She has occasional lightheadedness unrelated to activity including yesterday while driving. Episode yesterday was NOT associated with anything on her device. She is SOB with walking from car to buildings, or doing more than ADLs. No edema currently. Denies chest pain. Denies ICD shocks.  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   Discoid lupus    Fibromyalgia    History of blood transfusion "several"   "related to anemia; had some w/hysterectomy also"   Hx of cardiovascular stress test    ETT-Myoview (9/15):  No ischemia, EF 52%; NORMAL   Hx of echocardiogram    Echo (9/15):  EF 50-55%, ant HK, Gr 1 DD, mild MR, mild LAE, no effusion   Hypertension    Iron deficiency anemia    h/o iron transfusions   Lupus (systemic lupus erythematosus) ([place])    Migraine    "a few/year" (07/03/2016)   Pneumonia 12/2015   RA (rheumatoid arthritis) ([place])    "all over" (07/03/2016)   Sickle cell trait ([place])    Stroke ([place])  2014 X 1; 2015 X 2; 2016 X 1;    "right side of face more relaxed than the other; rare speech hesitation" (07/03/2016)   Vaginal Pap smear, abnormal    ASCUS; HPV   Past Surgical History:  Procedure Laterality Date   ABDOMINAL HYSTERECTOMY  2009   ABDOMINAL WOUND DEHISCENCE  2009   BUNIONECTOMY Left 06/15/2020   CARDIAC CATHETERIZATION N/A 10/11/2016   Procedure: Right/Left Heart Cath and Coronary Angiography;  Surgeon: Jolaine Artist, MD;  Location: [facility];  Service: Cardiovascular;  Laterality: N/A;   DILATION AND CURETTAGE OF UTERUS  1991   HEMATOMA EVACUATION  2009   abdomen   ICD IMPLANT  01/28/2018   ICD IMPLANT N/A 01/28/2018   Procedure: ICD IMPLANT;  Surgeon: Evans Lance, MD;  Location: [facility];  Service: Cardiovascular;  Laterality: N/A;   INCISE AND DRAIN ABCESS  2009 X 2   "abdomen after hysterectomy"   KNEE ARTHROSCOPY Right 1997   KNEE SURGERY Right    TUBAL LIGATION  1996    Current Outpatient Medications  Medication Sig Dispense Refill   albuterol (ACCUNEB) 1.25 MG/3ML nebulizer solution Take 3 mLs (1.25 mg total) by nebulization every 6 (six) hours as needed for wheezing. (Patient taking differently: Take 1 ampule by nebulization 3 (three) times daily as needed for wheezing.) 75 mL 12   aspirin 81 MG chewable tablet Chew 162 mg by mouth daily.  Taking 2 tablets     atorvastatin (LIPITOR) 10 MG tablet Take 1 tablet (10 mg total) by mouth daily. 90 tablet 2   budesonide-formoterol (SYMBICORT) 80-4.5 MCG/ACT inhaler Inhale 2 puffs into the lungs in the morning and at bedtime. 1 each 5   carvedilol (COREG) 25 MG tablet TAKE ONE TABLET BY MOUTH EVERY MORNING and TAKE ONE TABLET BY MOUTH EVERY EVENING 60 tablet 6   Continuous Blood Gluc Receiver (DEXCOM G6 RECEIVER) DEVI 1 Device by Does not apply route 3 (three) times daily before meals. 1 each 1   Continuous Blood Gluc Sensor (DEXCOM G6 SENSOR) MISC Inject 1 each into the skin 3 (three) times  daily. 3 each 1   Continuous Blood Gluc Transmit (DEXCOM G6 TRANSMITTER) MISC 1 Device by Does not apply route 3 (three) times daily before meals. 1 each 1   ENTRESTO 97-103 MG TAKE ONE TABLET BY MOUTH TWICE DAILY 99991111 tablet 0   folic acid (FOLVITE) 1 MG tablet TAKE 1 TABLET BY MOUTH EVERY MORNING 30 tablet 0   Glucagon (GVOKE HYPOPEN 2-PACK) 1 MG/0.2ML SOAJ Inject 1 mg into the skin as needed. 0.2 mL 5   hydrALAZINE (APRESOLINE) 25 MG tablet Take 1 tablet (25 mg total) by mouth 3 (three) times daily. Please schedule appointment for future refills. Thank you (Patient taking differently: Take 100 mg by mouth 3 (three) times daily. Please schedule appointment for future refills. Thank you) 90 tablet 0   hydroxychloroquine (PLAQUENIL) 200 MG tablet Take 1 tablet (200 mg total) by mouth 2 (two) times daily. 180 tablet 0   insulin lispro (HUMALOG KWIKPEN) 100 UNIT/ML KwikPen Use according to sliding scale 15 mL 11   Insulin Pen Needle (NOVOFINE PLUS PEN NEEDLE) 32G X 4 MM MISC Use with insulin pens dx code e11.65 300 each 3   Magnesium 400 MG TABS Take 1 tablet by mouth daily.      meloxicam (MOBIC) 15 MG tablet Take 15 mg by mouth daily.      metFORMIN (GLUCOPHAGE) 500 MG tablet TAKE ONE TABLET BY MOUTH TWICE DAILY 180 tablet 1   methotrexate (RHEUMATREX) 2.5 MG tablet Take 20 mg by mouth every Friday.   3   metolazone (ZAROXOLYN) 2.5 MG tablet TAKE 1 TABLET BY MOUTH AS NEEDED FOR WEIGHT GAIN OF 5 POUNDS WITHIN 3 DAYS AS DIRECTED needs appt for further refills 5 tablet 3   montelukast (SINGULAIR) 10 MG tablet TAKE ONE TABLET BY MOUTH EVERY MORNING 90 tablet 1   Multiple Vitamin (MULTIVITAMIN WITH MINERALS) TABS Take 1 tablet by mouth every morning.      ondansetron (ZOFRAN-ODT) 4 MG disintegrating tablet DISSOLVE ONE TABLET UNDER THE TONGUE EVERY TWELVE HOURS AS NEEDED 20 tablet 1   PARoxetine (PAXIL) 10 MG tablet TAKE ONE TABLET BY MOUTH EVERY MORNING 30 tablet 2   potassium chloride SA (KLOR-CON) 10  MEQ tablet Take 3 tablets (30 mEq total) by mouth 2 (two) times daily. 180 tablet 3   predniSONE (DELTASONE) 20 MG tablet Take 20 mg by mouth daily with breakfast.     PROAIR HFA 108 (90 Base) MCG/ACT inhaler INHALE 2 PUFFS BY MOUTH EVERY DAY AS NEEDED FOR SHORTNESS OF BREATH 18 g 2   spironolactone (ALDACTONE) 25 MG tablet TAKE ONE TABLET BY MOUTH EVERY MORNING and TAKE ONE TABLET BY MOUTH EVERY EVENING last refill without office visit please call [phone] 60 tablet 0   torsemide (DEMADEX) 20 MG tablet Take 2 tablets (40 mg total) by mouth  daily. 60 tablet 3   traZODone (DESYREL) 50 MG tablet Take 50 mg by mouth at bedtime as needed.     Vitamin D, Ergocalciferol, (DRISDOL) 1.25 MG (50000 UNIT) CAPS capsule TAKE ONE CAPSULE BY MOUTH ONCE WEEKLY ON TUESDAY and TAKE ONE CAPSULE BY MOUTH ONCE WEEKLY ON FRIDAY 8 capsule 0   No current facility-administered medications for this visit.    Allergies:   Hydrocodone-acetaminophen, Hydromorphone, Iodinated diagnostic agents, Other, Erythromycin, Latex, Rinvoq [upadacitinib], Farxiga [dapagliflozin], Tape, and Mircette [desogestrel-ethinyl estradiol]   Social History: Social History   Socioeconomic History   Marital status: Divorced    Spouse name: Not on file   Number of children: Not on file   Years of education: Not on file   Highest education level: Not on file  Occupational History   Not on file  Tobacco Use   Smoking status: Never   Smokeless tobacco: Never  Vaping Use   Vaping Use: Never used  Substance and Sexual Activity   Alcohol use: No   Drug use: No   Sexual activity: Not Currently    Birth control/protection: Surgical    Comment: hyst  Other Topics Concern   Not on file  Social History Narrative   Not on file   Social Determinants of Health   Financial Resource Strain: Medium Risk   Difficulty of Paying Living Expenses: Somewhat hard  Food Insecurity: Not on file  Transportation Needs: Not on file  Physical  Activity: Not on file  Stress: Not on file  Social Connections: Not on file  Intimate Partner Violence: Not on file    Family History: Family History  Problem Relation Age of Onset   Arthritis Mother    Heart murmur Mother    Drug abuse Mother    Allergies Mother    Heart attack Father    Cushing syndrome Father    Depression Father    Allergies Father    Dementia Paternal Grandmother    Cancer Paternal Grandfather    Diabetes Maternal Grandmother    Hypertension Maternal Grandmother    Asthma Maternal Grandmother    Heart attack Maternal Grandfather    Breast cancer Neg Hx     Review of Systems: All other systems reviewed and are otherwise negative except as noted above.   Physical Exam: Vitals:   09/05/21 0821  BP: 136/80  Pulse: 76  Resp: (!) 98  Weight: 191 lb 3.2 oz (86.7 kg)  Height: '5\' 6"'$  (1.676 m)     GEN- The patient is well appearing, alert and oriented x 3 today.   HEENT: normocephalic, atraumatic; sclera clear, conjunctiva pink; hearing intact; oropharynx clear; neck supple, no JVP Lymph- no cervical lymphadenopathy Lungs- Clear to ausculation bilaterally, normal work of breathing.  No wheezes, rales, rhonchi Heart- Regular rate and rhythm, no murmurs, rubs or gallops, PMI not laterally displaced GI- soft, non-tender, non-distended, bowel sounds present, no hepatosplenomegaly Extremities- no clubbing or cyanosis. No edema; DP/PT/radial pulses 2+ bilaterally MS- no significant deformity or atrophy Skin- warm and dry, no rash or lesion; ICD pocket well healed Psych- euthymic mood, full affect Neuro- strength and sensation are intact  ICD interrogation- reviewed in detail today,  See PACEART report  EKG:  EKG is not ordered today.  Recent Labs: 04/12/2021: Hemoglobin 10.7; Platelets 187 08/30/2021: B Natriuretic Peptide 50.9; BUN 12; Creatinine, Ser 0.86; Potassium 3.4; Sodium 141   Wt Readings from Last 3 Encounters:  09/05/21 191 lb 3.2 oz (86.7  kg)  08/30/21  191 lb 9.6 oz (86.9 kg)  07/25/21 190 lb (86.2 kg)     Other studies Reviewed: Additional studies/ records that were reviewed today include: Previous EP office notes.   Assessment and Plan:  1.  Chronic systolic dysfunction s/p [employer] single chamber ICD  euvolemic today NYHA III symptoms Stable on an appropriate medical regimen Normal ICD function See Claudia Desanct[employer] Art report No changes today She is scheduled for updated Echo 10/09/2021. She is interested in research opportunities and cardiac devices such as BAT or Anthem depending on her EF. I will make research aware of her appointment with Dr. Haroldine Laws and can follow up based on EF and Dr. Clayborne Dana recommendations  2. HTN Stable on current regimen   3. VT/NSVT Appear to be mostly asymptomatic. At times they may correlate with dizziness, but at other times she has dizziness without episodes.  Keep K > 4.0 and Mg > 2.0. K increased last week but she just got higher dose in mail today. She had NSVT 9/11, but none since.  9/9 prior to that, but then was mostly quiescent since the beginning of August. Overall Is infrequent.  She is on max dose coreg.  Would consider switch to Toprol for more cardioselectivity if continues despite management of her hypokalemia.   Current medicines are reviewed at length with the patient today.   The patient does not have concerns regarding her medicines.  The following changes were made today:  none  Disposition:   Follow up with EP APP in 6 months. No labs today as she has scheduled for 9/19 and has not yet increased her K.   Jacalyn Lefevre, PA-C  09/05/2021 9:13 AM  [facility] [address] [place] [address] [phone] (office) [phone] (fax)

## 2021-09-05 ENCOUNTER — Encounter: Payer: Self-pay | Admitting: Student

## 2021-09-05 ENCOUNTER — Other Ambulatory Visit: Payer: Self-pay

## 2021-09-05 ENCOUNTER — Ambulatory Visit (INDEPENDENT_AMBULATORY_CARE_PROVIDER_SITE_OTHER): Payer: Medicare Other | Admitting: Student

## 2021-09-05 VITALS — BP 136/80 | HR 76 | Resp 98 | Ht 66.0 in | Wt 191.2 lb

## 2021-09-05 DIAGNOSIS — I5022 Chronic systolic (congestive) heart failure: Secondary | ICD-10-CM | POA: Diagnosis not present

## 2021-09-05 DIAGNOSIS — I5020 Unspecified systolic (congestive) heart failure: Secondary | ICD-10-CM

## 2021-09-05 LAB — CUP PACEART INCLINIC DEVICE CHECK
Date Time Interrogation Session: 20220914091019
HighPow Impedance: 73 Ohm
Implantable Lead Implant Date: 20190206
Implantable Lead Location: 753860
Implantable Lead Model: 292
Implantable Lead Serial Number: 438194
Implantable Pulse Generator Implant Date: 20190206
Lead Channel Impedance Value: 502 Ohm
Lead Channel Pacing Threshold Amplitude: 0.7 V
Lead Channel Pacing Threshold Pulse Width: 0.4 ms
Lead Channel Sensing Intrinsic Amplitude: 25 mV
Lead Channel Setting Pacing Amplitude: 2.5 V
Lead Channel Setting Pacing Pulse Width: 0.4 ms
Lead Channel Setting Sensing Sensitivity: 0.5 mV
Pulse Gen Serial Number: 243572

## 2021-09-05 NOTE — Patient Instructions (Signed)
Medication Instructions:  Your physician recommends that you continue on your current medications as directed. Please refer to the Current Medication list given to you today.  *If you need a refill on your cardiac medications before your next appointment, please call your pharmacy*   Lab Work: None  If you have labs (blood work) drawn today and your tests are completely normal, you will receive your results only by: MyChart Message (if you have MyChart) OR A paper copy in the mail If you have any lab test that is abnormal or we need to change your treatment, we will call you to review the results.   Follow-Up: At [facility], you and your health needs are our priority.  As part of our continuing mission to provide you with exceptional heart care, we have created designated Provider Care Teams.  These Care Teams include your primary Cardiologist (physician) and Advanced Practice Providers (APPs -  Physician Assistants and Nurse Practitioners) who all work together to provide you with the care you need, when you need it.  Your next appointment:   6 month(s)  The format for your next appointment:   In Person  Provider:   Michael "Andy" Tillery, PA-C{    

## 2021-09-06 ENCOUNTER — Ambulatory Visit (INDEPENDENT_AMBULATORY_CARE_PROVIDER_SITE_OTHER): Payer: Medicare Other

## 2021-09-06 ENCOUNTER — Encounter: Payer: Medicare Other | Admitting: Nurse Practitioner

## 2021-09-06 VITALS — Ht 66.0 in | Wt 192.0 lb

## 2021-09-06 DIAGNOSIS — Z0000 Encounter for general adult medical examination without abnormal findings: Secondary | ICD-10-CM | POA: Diagnosis not present

## 2021-09-06 DIAGNOSIS — T8484XA Pain due to internal orthopedic prosthetic devices, implants and grafts, initial encounter: Secondary | ICD-10-CM | POA: Diagnosis not present

## 2021-09-06 DIAGNOSIS — T8484XD Pain due to internal orthopedic prosthetic devices, implants and grafts, subsequent encounter: Secondary | ICD-10-CM | POA: Diagnosis not present

## 2021-09-06 DIAGNOSIS — R7989 Other specified abnormal findings of blood chemistry: Secondary | ICD-10-CM | POA: Diagnosis not present

## 2021-09-06 DIAGNOSIS — M25561 Pain in right knee: Secondary | ICD-10-CM | POA: Diagnosis not present

## 2021-09-06 DIAGNOSIS — Z96651 Presence of right artificial knee joint: Secondary | ICD-10-CM | POA: Diagnosis not present

## 2021-09-06 NOTE — Progress Notes (Signed)
Remote ICD transmission.   

## 2021-09-06 NOTE — Patient Instructions (Signed)
Christine Mcdowell , Thank you for taking time to come for your Medicare Wellness Visit. I appreciate your ongoing commitment to your health goals. Please review the following plan we discussed and let me know if I can assist you in the future.   Screening recommendations/referrals: Colonoscopy: has cologuard to send in Mammogram: completed 10/03/2020 Bone Density: n/a Recommended yearly ophthalmology/optometry visit for glaucoma screening and checkup Recommended yearly dental visit for hygiene and checkup  Vaccinations: Influenza vaccine: due Pneumococcal vaccine: due Tdap vaccine: completed 08/24/2019, due 08/23/2029 Shingles vaccine: discussed  Covid-19:  01/01/2021, 07/07/2020, 06/16/2020  Advanced directives: copy in chart  Conditions/risks identified: none  Next appointment: Follow up in one year for your annual wellness visit.   Preventive Care 40-64 Years, Female Preventive care refers to lifestyle choices and visits with your health care provider that can promote health and wellness. What does preventive care include? A yearly physical exam. This is also called an annual well check. Dental exams once or twice a year. Routine eye exams. Ask your health care provider how often you should have your eyes checked. Personal lifestyle choices, including: Daily care of your teeth and gums. Regular physical activity. Eating a healthy diet. Avoiding tobacco and drug use. Limiting alcohol use. Practicing safe sex. Taking low-dose aspirin daily starting at age 16. Taking vitamin and mineral supplements as recommended by your health care provider. What happens during an annual well check? The services and screenings done by your health care provider during your annual well check will depend on your age, overall health, lifestyle risk factors, and family history of disease. Counseling  Your health care provider may ask you questions about your: Alcohol use. Tobacco use. Drug  use. Emotional well-being. Home and relationship well-being. Sexual activity. Eating habits. Work and work Statistician. Method of birth control. Menstrual cycle. Pregnancy history. Screening  You may have the following tests or measurements: Height, weight, and BMI. Blood pressure. Lipid and cholesterol levels. These may be checked every 5 years, or more frequently if you are over 46 years old. Skin check. Lung cancer screening. You may have this screening every year starting at age 45 if you have a 30-pack-year history of smoking and currently smoke or have quit within the past 15 years. Fecal occult blood test (FOBT) of the stool. You may have this test every year starting at age 100. Flexible sigmoidoscopy or colonoscopy. You may have a sigmoidoscopy every 5 years or a colonoscopy every 10 years starting at age 42. Hepatitis C blood test. Hepatitis B blood test. Sexually transmitted disease (STD) testing. Diabetes screening. This is done by checking your blood sugar (glucose) after you have not eaten for a while (fasting). You may have this done every 1-3 years. Mammogram. This may be done every 1-2 years. Talk to your health care provider about when you should start having regular mammograms. This may depend on whether you have a family history of breast cancer. BRCA-related cancer screening. This may be done if you have a family history of breast, ovarian, tubal, or peritoneal cancers. Pelvic exam and Pap test. This may be done every 3 years starting at age 63. Starting at age 51, this may be done every 5 years if you have a Pap test in combination with an HPV test. Bone density scan. This is done to screen for osteoporosis. You may have this scan if you are at high risk for osteoporosis. Discuss your test results, treatment options, and if necessary, the need for more tests  with your health care provider. Vaccines  Your health care provider may recommend certain vaccines, such  as: Influenza vaccine. This is recommended every year. Tetanus, diphtheria, and acellular pertussis (Tdap, Td) vaccine. You may need a Td booster every 10 years. Zoster vaccine. You may need this after age 55. Pneumococcal 13-valent conjugate (PCV13) vaccine. You may need this if you have certain conditions and were not previously vaccinated. Pneumococcal polysaccharide (PPSV23) vaccine. You may need one or two doses if you smoke cigarettes or if you have certain conditions. Talk to your health care provider about which screenings and vaccines you need and how often you need them. This information is not intended to replace advice given to you by your health care provider. Make sure you discuss any questions you have with your health care provider. Document Released: 01/05/2016 Document Revised: 08/28/2016 Document Reviewed: 10/10/2015 Elsevier Interactive Patient Education  2017 [employer] Prevention in the Home Falls can cause injuries. They can happen to people of all ages. There are many things you can do to make your home safe and to help prevent falls. What can I do on the outside of my home? Regularly fix the edges of walkways and driveways and fix any cracks. Remove anything that might make you trip as you walk through a door, such as a raised step or threshold. Trim any bushes or trees on the path to your home. Use bright outdoor lighting. Clear any walking paths of anything that might make someone trip, such as rocks or tools. Regularly check to see if handrails are loose or broken. Make sure that both sides of any steps have handrails. Any raised decks and porches should have guardrails on the edges. Have any leaves, snow, or ice cleared regularly. Use sand or salt on walking paths during winter. Clean up any spills in your garage right away. This includes oil or grease spills. What can I do in the bathroom? Use night lights. Install grab bars by the toilet and in  the tub and shower. Do not use towel bars as grab bars. Use non-skid mats or decals in the tub or shower. If you need to sit down in the shower, use a plastic, non-slip stool. Keep the floor dry. Clean up any water that spills on the floor as soon as it happens. Remove soap buildup in the tub or shower regularly. Attach bath mats securely with double-sided non-slip rug tape. Do not have throw rugs and other things on the floor that can make you trip. What can I do in the bedroom? Use night lights. Make sure that you have a light by your bed that is easy to reach. Do not use any sheets or blankets that are too big for your bed. They should not hang down onto the floor. Have a firm chair that has side arms. You can use this for support while you get dressed. Do not have throw rugs and other things on the floor that can make you trip. What can I do in the kitchen? Clean up any spills right away. Avoid walking on wet floors. Keep items that you use a lot in easy-to-reach places. If you need to reach something above you, use a strong step stool that has a grab bar. Keep electrical cords out of the way. Do not use floor polish or wax that makes floors slippery. If you must use wax, use non-skid floor wax. Do not have throw rugs and other things on the  floor that can make you trip. What can I do with my stairs? Do not leave any items on the stairs. Make sure that there are handrails on both sides of the stairs and use them. Fix handrails that are broken or loose. Make sure that handrails are as long as the stairways. Check any carpeting to make sure that it is firmly attached to the stairs. Fix any carpet that is loose or worn. Avoid having throw rugs at the top or bottom of the stairs. If you do have throw rugs, attach them to the floor with carpet tape. Make sure that you have a light switch at the top of the stairs and the bottom of the stairs. If you do not have them, ask someone to add them  for you. What else can I do to help prevent falls? Wear shoes that: Do not have high heels. Have rubber bottoms. Are comfortable and fit you well. Are closed at the toe. Do not wear sandals. If you use a stepladder: Make sure that it is fully opened. Do not climb a closed stepladder. Make sure that both sides of the stepladder are locked into place. Ask someone to hold it for you, if possible. Clearly mark and make sure that you can see: Any grab bars or handrails. First and last steps. Where the edge of each step is. Use tools that help you move around (mobility aids) if they are needed. These include: Canes. Walkers. Scooters. Crutches. Turn on the lights when you go into a dark area. Replace any light bulbs as soon as they burn out. Set up your furniture so you have a clear path. Avoid moving your furniture around. If any of your floors are uneven, fix them. If there are any pets around you, be aware of where they are. Review your medicines with your doctor. Some medicines can make you feel dizzy. This can increase your chance of falling. Ask your doctor what other things that you can do to help prevent falls. This information is not intended to replace advice given to you by your health care provider. Make sure you discuss any questions you have with your health care provider. Document Released: 10/05/2009 Document Revised: 05/16/2016 Document Reviewed: 01/13/2015 Elsevier Interactive Patient Education  2017 [employer].

## 2021-09-06 NOTE — Progress Notes (Signed)
I connected with Christine Mcdowell today by telephone and verified that I am speaking with the correct person using two identifiers. Location patient: home Location provider: work Persons participating in the virtual visit: Markela Zahorik, Glenna Durand LPN.   I discussed the limitations, risks, security and privacy concerns of performing an evaluation and management service by telephone and the availability of in person appointments. I also discussed with the patient that there may be a patient responsible charge related to this service. The patient expressed understanding and verbally consented to this telephonic visit.    Interactive audio and video telecommunications were attempted between this provider and patient, however failed, due to patient having technical difficulties OR patient did not have access to video capability.  We continued and completed visit with audio only.     Vital signs may be patient reported or missing.  Subjective:   Christine Mcdowell is a 50 y.o. female who presents for Medicare Annual (Subsequent) preventive examination.  Review of Systems     Cardiac Risk Factors include: hypertension;diabetes mellitus;obesity (BMI >30kg/m2);sedentary lifestyle     Objective:    Today's Vitals   09/06/21 1458 09/06/21 1459  Weight: 192 lb (87.1 kg)   Height: '5\' 6"'$  (1.676 m)   PainSc:  8    Body mass index is 30.99 kg/m.  Advanced Directives 09/06/2021 08/30/2020 01/31/2020 11/22/2019 09/11/2019 08/24/2019 08/14/2019  Does Patient Have a Medical Advance Directive? Yes Yes No No No No No  Type of Advance Directive Out of facility DNR (pink MOST or yellow form) Out of facility DNR (pink MOST or yellow form) - - - - -  Does patient want to make changes to medical advance directive? - - - - - - -  Copy of [place] in Chart? - - - - - - -  Would patient like information on creating a medical advance directive? - - - - No - Patient declined - No -  Patient declined  Some encounter information is confidential and restricted. Go to Review Flowsheets activity to see all data.    Current Medications (verified) Outpatient Encounter Medications as of 09/06/2021  Medication Sig   albuterol (ACCUNEB) 1.25 MG/3ML nebulizer solution Take 3 mLs (1.25 mg total) by nebulization every 6 (six) hours as needed for wheezing. (Patient taking differently: Take 1 ampule by nebulization 3 (three) times daily as needed for wheezing.)   aspirin 81 MG chewable tablet Chew 162 mg by mouth daily. Taking 2 tablets   atorvastatin (LIPITOR) 10 MG tablet Take 1 tablet (10 mg total) by mouth daily.   budesonide-formoterol (SYMBICORT) 80-4.5 MCG/ACT inhaler Inhale 2 puffs into the lungs in the morning and at bedtime.   carvedilol (COREG) 25 MG tablet TAKE ONE TABLET BY MOUTH EVERY MORNING and TAKE ONE TABLET BY MOUTH EVERY EVENING   Continuous Blood Gluc Receiver (DEXCOM G6 RECEIVER) DEVI 1 Device by Does not apply route 3 (three) times daily before meals.   Continuous Blood Gluc Sensor (DEXCOM G6 SENSOR) MISC Inject 1 each into the skin 3 (three) times daily.   Continuous Blood Gluc Transmit (DEXCOM G6 TRANSMITTER) MISC 1 Device by Does not apply route 3 (three) times daily before meals.   ENTRESTO 97-103 MG TAKE ONE TABLET BY MOUTH TWICE DAILY   folic acid (FOLVITE) 1 MG tablet TAKE 1 TABLET BY MOUTH EVERY MORNING   Glucagon (GVOKE HYPOPEN 2-PACK) 1 MG/0.2ML SOAJ Inject 1 mg into the skin as needed.   hydrALAZINE (APRESOLINE) 25 MG tablet  Take 1 tablet (25 mg total) by mouth 3 (three) times daily. Please schedule appointment for future refills. Thank you (Patient taking differently: Take 100 mg by mouth 3 (three) times daily. Please schedule appointment for future refills. Thank you)   hydroxychloroquine (PLAQUENIL) 200 MG tablet Take 1 tablet (200 mg total) by mouth 2 (two) times daily.   insulin lispro (HUMALOG KWIKPEN) 100 UNIT/ML KwikPen Use according to sliding  scale   Insulin Pen Needle (NOVOFINE PLUS PEN NEEDLE) 32G X 4 MM MISC Use with insulin pens dx code e11.65   Magnesium 400 MG TABS Take 1 tablet by mouth daily.    meloxicam (MOBIC) 15 MG tablet Take 15 mg by mouth daily.    metFORMIN (GLUCOPHAGE) 500 MG tablet TAKE ONE TABLET BY MOUTH TWICE DAILY   methotrexate (RHEUMATREX) 2.5 MG tablet Take 20 mg by mouth every Friday.    metolazone (ZAROXOLYN) 2.5 MG tablet TAKE 1 TABLET BY MOUTH AS NEEDED FOR WEIGHT GAIN OF 5 POUNDS WITHIN 3 DAYS AS DIRECTED needs appt for further refills   montelukast (SINGULAIR) 10 MG tablet TAKE ONE TABLET BY MOUTH EVERY MORNING   Multiple Vitamin (MULTIVITAMIN WITH MINERALS) TABS Take 1 tablet by mouth every morning.    ondansetron (ZOFRAN-ODT) 4 MG disintegrating tablet DISSOLVE ONE TABLET UNDER THE TONGUE EVERY TWELVE HOURS AS NEEDED   PARoxetine (PAXIL) 10 MG tablet TAKE ONE TABLET BY MOUTH EVERY MORNING   potassium chloride SA (KLOR-CON) 10 MEQ tablet Take 3 tablets (30 mEq total) by mouth 2 (two) times daily.   predniSONE (DELTASONE) 20 MG tablet Take 20 mg by mouth daily with breakfast.   PROAIR HFA 108 (90 Base) MCG/ACT inhaler INHALE 2 PUFFS BY MOUTH EVERY DAY AS NEEDED FOR SHORTNESS OF BREATH   spironolactone (ALDACTONE) 25 MG tablet TAKE ONE TABLET BY MOUTH EVERY MORNING and TAKE ONE TABLET BY MOUTH EVERY EVENING last refill without office visit please call [phone]   torsemide (DEMADEX) 20 MG tablet Take 2 tablets (40 mg total) by mouth daily.   traZODone (DESYREL) 50 MG tablet Take 50 mg by mouth at bedtime as needed.   Vitamin D, Ergocalciferol, (DRISDOL) 1.25 MG (50000 UNIT) CAPS capsule TAKE ONE CAPSULE BY MOUTH ONCE WEEKLY ON TUESDAY and TAKE ONE CAPSULE BY MOUTH ONCE WEEKLY ON FRIDAY   No facility-administered encounter medications on file as of 09/06/2021.    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   Discoid lupus    Fibromyalgia    History of blood transfusion "several"   "related to anemia; had some w/hysterectomy also"   Hx of cardiovascular stress test    ETT-Myoview (9/15):  No ischemia, EF 52%; NORMAL   Hx of echocardiogram    Echo (9/15):  EF 50-55%, ant HK, Gr 1 DD, mild MR, mild LAE, no effusion   Hypertension    Iron deficiency anemia    h/o iron transfusions   Lupus (systemic lupus erythematosus) ([place])    Migraine    "a few/year" (07/03/2016)   Pneumonia 12/2015   RA (rheumatoid arthritis) ([place])    "  all over" (07/03/2016)   Sickle cell trait ([place])    Stroke ([place]) 2014 X 1; 2015 X 2; 2016 X 1;    "right side of face more relaxed than the other; rare speech hesitation" (07/03/2016)   Vaginal Pap smear, abnormal    ASCUS; HPV   Past Surgical History:  Procedure Laterality Date   ABDOMINAL HYSTERECTOMY  2009   ABDOMINAL WOUND DEHISCENCE  2009   BUNIONECTOMY Left 06/15/2020   CARDIAC CATHETERIZATION N/A 10/11/2016   Procedure: Right/Left Heart Cath and Coronary Angiography;  Surgeon: Jolaine Artist, MD;  Location: [facility];  Service: Cardiovascular;  Laterality: N/A;   DILATION AND CURETTAGE OF UTERUS  1991   HEMATOMA EVACUATION  2009   abdomen   ICD IMPLANT  01/28/2018   ICD IMPLANT N/A 01/28/2018   Procedure: ICD IMPLANT;  Surgeon: Evans Lance, MD;  Location: [facility];  Service: Cardiovascular;  Laterality: N/A;   INCISE AND DRAIN ABCESS  2009 X 2   "abdomen after hysterectomy"   KNEE ARTHROSCOPY Right 1997   KNEE SURGERY Right    TUBAL  LIGATION  1996   Family History  Problem Relation Age of Onset   Arthritis Mother    Heart murmur Mother    Drug abuse Mother    Allergies Mother    Heart attack Father    Cushing syndrome Father    Depression Father    Allergies Father    Dementia Paternal Grandmother    Cancer Paternal Grandfather    Diabetes Maternal Grandmother    Hypertension Maternal Grandmother    Asthma Maternal Grandmother    Heart attack Maternal Grandfather    Breast cancer Neg Hx    Social History   Socioeconomic History   Marital status: Divorced    Spouse name: Not on file   Number of children: Not on file   Years of education: Not on file   Highest education level: Not on file  Occupational History   Not on file  Tobacco Use   Smoking status: Never   Smokeless tobacco: Never  Vaping Use   Vaping Use: Never used  Substance and Sexual Activity   Alcohol use: No   Drug use: No   Sexual activity: Not Currently    Birth control/protection: Surgical    Comment: hyst  Other Topics Concern   Not on file  Social History Narrative   Not on file   Social Determinants of Health   Financial Resource Strain: High Risk   Difficulty of Paying Living Expenses: Hard  Food Insecurity: No Food Insecurity   Worried About Running Out of Food in the Last Year: Never true   Ran Out of Food in the Last Year: Never true  Transportation Needs: No Transportation Needs   Lack of Transportation (Medical): No   Lack of Transportation (Non-Medical): No  Physical Activity: Inactive   Days of Exercise per Week: 0 days   Minutes of Exercise per Session: 0 min  Stress: Stress Concern Present   Feeling of Stress : Rather much  Social Connections: Not on file    Tobacco Counseling Counseling given: Not Answered   Clinical Intake:  Pre-visit preparation completed: Yes  Pain : 0-10 Pain Score: 8  Pain Type: Chronic pain Pain Location: Knee Pain Orientation: Right Pain Descriptors / Indicators:  Sharp Pain Onset: More than a month ago Pain Frequency: Constant     Nutritional Status: BMI > 30  Obese Nutritional Risks: None  Diabetes: Yes  How often do you need to have someone help you when you read instructions, pamphlets, or other written materials from your doctor or pharmacy?: 1 - Never What is the last grade level you completed in school?: some college  Diabetic? Yes Nutrition Risk Assessment:  Has the patient had any N/V/D within the last 2 months?  No  Does the patient have any non-healing wounds?  No  Has the patient had any unintentional weight loss or weight gain?  Yes   Diabetes:  Is the patient diabetic?  Yes  If diabetic, was a CBG obtained today?  No  Did the patient bring in their glucometer from home?  No  How often do you monitor your CBG's? Has dexacom.   Financial Strains and Diabetes Management:  Are you having any financial strains with the device, your supplies or your medication? No .  Does the patient want to be seen by Chronic Care Management for management of their diabetes?  No  Would the patient like to be referred to a Nutritionist or for Diabetic Management?  No   Diabetic Exams:  Diabetic Eye Exam: Overdue for diabetic eye exam. Pt has been advised about the importance in completing this exam. Patient advised to call and schedule an eye exam. Diabetic Foot Exam: Completed 11/29/2020   Interpreter Needed?: No  Information entered by :: NAllen LPN   Activities of Daily Living In your present state of health, do you have any difficulty performing the following activities: 09/06/2021  Hearing? N  Vision? Y  Comment when sugar gets high  Difficulty concentrating or making decisions? N  Walking or climbing stairs? Y  Dressing or bathing? N  Doing errands, shopping? N  Preparing Food and eating ? N  Using the Toilet? N  In the past six months, have you accidently leaked urine? N  Do you have problems with loss of bowel control? N   Managing your Medications? N  Managing your Finances? N  Housekeeping or managing your Housekeeping? N  Some recent data might be hidden    Patient Care Team: [name], FNP as PCP - General (General Practice) Bensimhon, Shaune Pascal, MD as PCP - Cardiology (Cardiology) Evans Lance, MD as PCP - Electrophysiology (Cardiology) Lendon Colonel, NP as Nurse Practitioner (Nurse Practitioner) [name], RN as Case Manager Caudill, Kennieth Francois, [facility] (Inactive) (Pharmacist) Mayford Knife, [facility] (Pharmacist)  Indicate any recent Medical Services you may have received from other than [facility] providers in the past year (date may be approximate).     Assessment:   This is a routine wellness examination for [place].  Hearing/Vision screen Vision Screening - Comments:: Regular eye exams  Dietary issues and exercise activities discussed: Current Exercise Habits: The patient does not participate in regular exercise at present   Goals Addressed             This Visit's Progress    Patient Stated       09/06/2021, get through surgeries       Depression Screen PHQ 2/9 Scores 09/06/2021 10/09/2020 08/30/2020 04/12/2020 11/24/2019 11/24/2019 08/24/2019  PHQ - 2 Score 0 0 0 0 0 0 0  PHQ- 9 Score - 2 - 4 0 - -    Fall Risk Fall Risk  09/06/2021 08/30/2020 11/24/2019 08/24/2019 06/23/2019  Falls in the past year? 1 1 0 1 0  Comment tripped over the dog slipped in kitchen, stepped in a hole - - -  Number  falls in past yr: 0 1 - 0 -  Injury with Fall? 0 0 - 0 -  Risk for fall due to : Medication side effect Medication side effect - - -  Follow up Falls evaluation completed;Education provided;Falls prevention discussed Falls evaluation completed;Education provided;Falls prevention discussed - - -    FALL RISK PREVENTION PERTAINING TO THE HOME:  Any stairs in or around the home? No  If so, are there any without handrails? N/a Home free of loose throw rugs in walkways, pet beds, electrical  cords, etc? Yes  Adequate lighting in your home to reduce risk of falls? Yes   ASSISTIVE DEVICES UTILIZED TO PREVENT FALLS:  Life alert? No  Use of a cane, walker or w/c? No  Grab bars in the bathroom? Yes  Shower chair or bench in shower? No  Elevated toilet seat or a handicapped toilet? No   TIMED UP AND GO:  Was the test performed? No .       Cognitive Function:     6CIT Screen 09/06/2021 08/30/2020 08/24/2019  What Year? 0 points 0 points 0 points  What month? 0 points 0 points 0 points  What time? 0 points 3 points 0 points  Count back from 20 0 points 0 points 0 points  Months in reverse 0 points 0 points 0 points  Repeat phrase 0 points 0 points 0 points  Total Score 0 3 0    Immunizations Immunization History  Administered Date(s) Administered   Influenza, High Dose Seasonal PF 09/02/2018   Influenza, Seasonal, Injecte, Preservative Fre 11/06/2017   Influenza,inj,Quad PF,6+ Mos 11/06/2017, 08/24/2019, 08/30/2020   Influenza-Unspecified 08/08/2017   PFIZER(Purple Top)SARS-COV-2 Vaccination 06/16/2020, 07/07/2020, 01/01/2021   Tdap 08/24/2019    TDAP status: Up to date  Flu Vaccine status: Due, Education has been provided regarding the importance of this vaccine. Advised may receive this vaccine at local pharmacy or Health Dept. Aware to provide a copy of the vaccination record if obtained from local pharmacy or Health Dept. Verbalized acceptance and understanding.  Pneumococcal vaccine status: Due, Education has been provided regarding the importance of this vaccine. Advised may receive this vaccine at local pharmacy or Health Dept. Aware to provide a copy of the vaccination record if obtained from local pharmacy or Health Dept. Verbalized acceptance and understanding.  Covid-19 vaccine status: Completed vaccines  Qualifies for Shingles Vaccine? Yes   Zostavax completed No   Shingrix Completed?: No.    Education has been provided regarding the importance of this  vaccine. Patient has been advised to call insurance company to determine out of pocket expense if they have not yet received this vaccine. Advised may also receive vaccine at local pharmacy or Health Dept. Verbalized acceptance and understanding.  Screening Tests Health Maintenance  Topic Date Due   PNEUMOCOCCAL POLYSACCHARIDE VACCINE AGE 67-64 HIGH RISK  Never done   Pneumococcal Vaccine 81-34 Years old (1 - PCV) Never done   Zoster Vaccines- Shingrix (1 of 2) Never done   COLONOSCOPY (Pts 45-4yr Insurance coverage will need to be confirmed)  Never done   COVID-19 Vaccine (4 - Booster for Pfizer series) 03/26/2021   INFLUENZA VACCINE  07/23/2021   FOOT EXAM  11/29/2021   OPHTHALMOLOGY EXAM  12/12/2021   HEMOGLOBIN A1C  01/25/2022   MAMMOGRAM  09/22/2022   TETANUS/TDAP  08/23/2029   Hepatitis C Screening  Completed   HIV Screening  Completed   HPV VACCINES  Aged Out   PAP SMEAR-Modifier  Discontinued  Health Maintenance  Health Maintenance Due  Topic Date Due   PNEUMOCOCCAL POLYSACCHARIDE VACCINE AGE 72-64 HIGH RISK  Never done   Pneumococcal Vaccine 88-46 Years old (1 - PCV) Never done   Zoster Vaccines- Shingrix (1 of 2) Never done   COLONOSCOPY (Pts 45-22yr Insurance coverage will need to be confirmed)  Never done   COVID-19 Vaccine (4 - Booster for [employer]) 03/26/2021   INFLUENZA VACCINE  07/23/2021    Colorectal cancer screening: has cologuard to send in   Mammogram status: Completed 10/03/2020. Repeat every year  Bone Density status: n/a  Lung Cancer Screening: (Low Dose CT Chest recommended if Age 52-80years, 30 pack-year currently smoking OR have quit w/in 15years.) does not qualify.   Lung Cancer Screening Referral: no  Additional Screening:  Hepatitis C Screening: does qualify; Completed 08/30/2020  Vision Screening: Recommended annual ophthalmology exams for early detection of glaucoma and other disorders of the eye. Is the patient up to date with  their annual eye exam?  No  Who is the provider or what is the name of the office in which the patient attends annual eye exams? none If pt is not established with a provider, would they like to be referred to a provider to establish care? No .   Dental Screening: Recommended annual dental exams for proper oral hygiene  Community Resource Referral / Chronic Care Management: CRR required this visit?  Yes   CCM required this visit?  No      Plan:     I have personally reviewed and noted the following in the patient's chart:   Medical and social history Use of alcohol, tobacco or illicit drugs  Current medications and supplements including opioid prescriptions.  Functional ability and status Nutritional status Physical activity Advanced directives List of other physicians Hospitalizations, surgeries, and ER visits in previous 12 months Vitals Screenings to include cognitive, depression, and falls Referrals and appointments  In addition, I have reviewed and discussed with patient certain preventive protocols, quality metrics, and best practice recommendations. A written personalized care plan for preventive services as well as general preventive health recommendations were provided to patient.     NKellie Simmering LPN   [id number]  Nurse Notes:

## 2021-09-07 ENCOUNTER — Telehealth: Payer: Self-pay

## 2021-09-07 ENCOUNTER — Ambulatory Visit (INDEPENDENT_AMBULATORY_CARE_PROVIDER_SITE_OTHER): Payer: Medicare Other

## 2021-09-07 DIAGNOSIS — I5020 Unspecified systolic (congestive) heart failure: Secondary | ICD-10-CM

## 2021-09-07 DIAGNOSIS — I5022 Chronic systolic (congestive) heart failure: Secondary | ICD-10-CM

## 2021-09-07 NOTE — Telephone Encounter (Signed)
Remote ICM transmission received.  Attempted call to patient regarding ICM remote transmission and no answer or voice mail option.  

## 2021-09-07 NOTE — Progress Notes (Signed)
EPIC Encounter for ICM Monitoring  Patient Name: Christine Mcdowell is a 50 y.o. female Date: 09/07/2021 Primary Care Physican: [name], [place] Primary Cardiologist: [place] Electrophysiologist: Lovena Le Last Weight: 184 lbs                                                                               Transmission reviewed.    09/06/2021 [employer] Heart Failure Index 7 suggesting normal fluid levels.    Prescribed:  Torsemide 20 mg take 2 tablets (40 mg total) daily Potassium 20 mEq take 3 tablets twice a day Spironolactone 25 mg take 1 tablet twice a day. Metolazone 2.5 mg take one tablet as needed for weight gain of 5 lbs within 3 days.     Labs: 04/12/2021 Creatinine 1.08, BUN 18, Potassium 3.1, Sodium 140, GFR >60 A complete set of results can be found in Results Review.   Recommendations:  No changes     Follow-up plan: ICM clinic phone appointment on 10/08/2021.  91 day device clinic remote transmission 11/28/2021.           EP/Cardiology next office visit:  Recall for 09/01/2021 with Dr. Lovena Le. 09/10/2021 with HF clinic.  10/09/2021 with Dr Haroldine Laws.        Copy of ICM check sent to Dr. Lovena Le.  Sent to [facility], NP as requested to recheck fluid levels after 9/8 OV.   3 Month Trend    8 Day Data Trend          [name], RN 09/07/2021 4:22 PM

## 2021-09-08 NOTE — Progress Notes (Signed)
Advanced Heart Failure Clinic Note   Date:  09/10/2021   ID:  Christine Mcdowell, DOB [birth year], MRN [id number]  Location: Home  Provider location: [facility] Type of Visit: Established patient  PCP:  [name], FNP  EP: Dr. 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    Admitted in October 2017 with increased dyspnea and volume overload. Diuresed with IV lasix and transitioned to lasix 40 mg twice a day. L/RHC normal filling pressures, EF ~20%, and normal coronaries. Discharge weight 184 pounds.   Cardiac MRI in 02/2015 showed EF 46%. No LGE.   Echo 02/12/19 EF: 40-45%, RV normal.    CPX 9/19 with relaltively normal spirometry. Mild HF limitation.    Had R TKA 3/20.   Had Covid-19 infection in 3/21 with COIVD PNA. Received infusion but not hospitalized. Seen by [facility].   Echo 8/21: EF ~ 40%-45%, Grade II DD, moderate MR.  She was COVID + 4/22 w/ COVID PNA.   Follow up 08/30/21 with NYHA III symptoms, volume up and BP elevated. Torsemide increased.  Today she returns for HF follow up. SOB walking on flat ground grocery shopping. Now has a dog and gets SOB walking her after 15-20 minutes. Some dizziness, no syncope.  Denies CP, edema. +3  pillow orthopnea. Appetite ok. No fever or chills. Weight at home 190 pounds. Taking all medications. BP 150/90s at home. Awakens in middle of the night with palpitations 4-5x/night. Has left bunionectomy scheduled this week and plans on right knee replacement revision soon.  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   Anemia    Anginal  pain (HCC)    Asthma    Cervical cancer ([place])    cervical 1996   CHF (congestive heart failure) ([place])    Diabetes mellitus without complication ([place])    steroid induced   Discoid lupus    Fibromyalgia    History of blood transfusion "several"   "related to anemia; had some w/hysterectomy also"   Hx of cardiovascular stress test    ETT-Myoview (9/15):  No ischemia, EF 52%; NORMAL   Hx of echocardiogram    Echo (9/15):  EF 50-55%, ant HK, Gr 1 DD, mild MR, mild LAE, no effusion   Hypertension    Iron deficiency anemia    h/o iron transfusions   Lupus (systemic lupus erythematosus) ([place])    Migraine    "a few/year" (07/03/2016)   Pneumonia 12/2015   RA (rheumatoid arthritis) ([place])    "all over" (07/03/2016)   Sickle cell trait ([place])    Stroke ([place]) 2014 X 1; 2015 X 2; 2016 X 1;    "right side of face more relaxed than the other; rare speech hesitation" (07/03/2016)   Vaginal Pap smear, abnormal    ASCUS; HPV   Past Surgical History:  Procedure Laterality Date   ABDOMINAL HYSTERECTOMY  2009   ABDOMINAL WOUND DEHISCENCE  2009   BUNIONECTOMY Left 06/15/2020   CARDIAC CATHETERIZATION N/A 10/11/2016   Procedure: Right/Left Heart Cath and Coronary Angiography;  Surgeon: Jolaine Artist, MD;  Location: [facility];  Service: Cardiovascular;  Laterality: N/A;   DILATION AND CURETTAGE  OF UTERUS  1991   HEMATOMA EVACUATION  2009   abdomen   ICD IMPLANT  01/28/2018   ICD IMPLANT N/A 01/28/2018   Procedure: ICD IMPLANT;  Surgeon: Evans Lance, MD;  Location: [facility];  Service: Cardiovascular;  Laterality: N/A;   INCISE AND DRAIN ABCESS  2009 X 2   "abdomen after hysterectomy"   KNEE ARTHROSCOPY Right 1997   KNEE SURGERY Right    TUBAL LIGATION  1996   Current Outpatient Medications  Medication Sig Dispense Refill   albuterol (ACCUNEB) 1.25 MG/3ML nebulizer solution Take 3 mLs (1.25 mg total) by nebulization every 6 (six) hours as needed for wheezing. (Patient  taking differently: Take 1 ampule by nebulization 3 (three) times daily as needed for wheezing.) 75 mL 12   aspirin 81 MG chewable tablet Chew 162 mg by mouth daily. Taking 2 tablets     atorvastatin (LIPITOR) 10 MG tablet Take 1 tablet (10 mg total) by mouth daily. 90 tablet 2   budesonide-formoterol (SYMBICORT) 80-4.5 MCG/ACT inhaler Inhale 2 puffs into the lungs in the morning and at bedtime. 1 each 5   carvedilol (COREG) 25 MG tablet TAKE ONE TABLET BY MOUTH EVERY MORNING and TAKE ONE TABLET BY MOUTH EVERY EVENING 60 tablet 6   Continuous Blood Gluc Receiver (DEXCOM G6 RECEIVER) DEVI 1 Device by Does not apply route 3 (three) times daily before meals. 1 each 1   Continuous Blood Gluc Sensor (DEXCOM G6 SENSOR) MISC Inject 1 each into the skin 3 (three) times daily. 3 each 1   Continuous Blood Gluc Transmit (DEXCOM G6 TRANSMITTER) MISC 1 Device by Does not apply route 3 (three) times daily before meals. 1 each 1   ENTRESTO 97-103 MG TAKE ONE TABLET BY MOUTH TWICE DAILY 99991111 tablet 0   folic acid (FOLVITE) 1 MG tablet TAKE 1 TABLET BY MOUTH EVERY MORNING 30 tablet 0   Glucagon (GVOKE HYPOPEN 2-PACK) 1 MG/0.2ML SOAJ Inject 1 mg into the skin as needed. 0.2 mL 5   hydrALAZINE (APRESOLINE) 25 MG tablet Take 1 tablet (25 mg total) by mouth 3 (three) times daily. Please schedule appointment for future refills. Thank you (Patient taking differently: Take 100 mg by mouth 3 (three) times daily. Please schedule appointment for future refills. Thank you) 90 tablet 0   hydroxychloroquine (PLAQUENIL) 200 MG tablet Take 1 tablet (200 mg total) by mouth 2 (two) times daily. 180 tablet 0   insulin lispro (HUMALOG KWIKPEN) 100 UNIT/ML KwikPen Use according to sliding scale 15 mL 11   Insulin Pen Needle (NOVOFINE PLUS PEN NEEDLE) 32G X 4 MM MISC Use with insulin pens dx code e11.65 300 each 3   Magnesium 400 MG TABS Take 1 tablet by mouth daily.      meloxicam (MOBIC) 15 MG tablet Take 15 mg by mouth daily.       metFORMIN (GLUCOPHAGE) 500 MG tablet TAKE ONE TABLET BY MOUTH TWICE DAILY 180 tablet 1   methotrexate (RHEUMATREX) 2.5 MG tablet Take 20 mg by mouth every Friday.   3   metolazone (ZAROXOLYN) 2.5 MG tablet TAKE 1 TABLET BY MOUTH AS NEEDED FOR WEIGHT GAIN OF 5 POUNDS WITHIN 3 DAYS AS DIRECTED needs appt for further refills 5 tablet 3   montelukast (SINGULAIR) 10 MG tablet TAKE ONE TABLET BY MOUTH EVERY MORNING 90 tablet 1   Multiple Vitamin (MULTIVITAMIN WITH MINERALS) TABS Take 1 tablet by mouth every morning.      ondansetron (ZOFRAN-ODT) 4 MG  disintegrating tablet DISSOLVE ONE TABLET UNDER THE TONGUE EVERY TWELVE HOURS AS NEEDED 20 tablet 1   PARoxetine (PAXIL) 10 MG tablet TAKE ONE TABLET BY MOUTH EVERY MORNING 30 tablet 2   potassium chloride SA (KLOR-CON) 10 MEQ tablet Take 3 tablets (30 mEq total) by mouth 2 (two) times daily. 180 tablet 3   PROAIR HFA 108 (90 Base) MCG/ACT inhaler INHALE 2 PUFFS BY MOUTH EVERY DAY AS NEEDED FOR SHORTNESS OF BREATH 18 g 2   spironolactone (ALDACTONE) 25 MG tablet TAKE ONE TABLET BY MOUTH EVERY MORNING and TAKE ONE TABLET BY MOUTH EVERY EVENING last refill without office visit please call [phone] 60 tablet 0   torsemide (DEMADEX) 20 MG tablet Take 2 tablets (40 mg total) by mouth daily. 60 tablet 3   traZODone (DESYREL) 50 MG tablet Take 50 mg by mouth at bedtime as needed.     Vitamin D, Ergocalciferol, (DRISDOL) 1.25 MG (50000 UNIT) CAPS capsule TAKE ONE CAPSULE BY MOUTH ONCE WEEKLY ON TUESDAY and TAKE ONE CAPSULE BY MOUTH ONCE WEEKLY ON FRIDAY 8 capsule 0   predniSONE (DELTASONE) 20 MG tablet Take 20 mg by mouth daily with breakfast.     No current facility-administered medications for this encounter.   Allergies:   Hydrocodone-acetaminophen, Hydromorphone, Iodinated diagnostic agents, Other, Erythromycin, Latex, Rinvoq [upadacitinib], Farxiga [dapagliflozin], Tape, and Mircette [desogestrel-ethinyl estradiol]   Social History:  The patient  reports  that she has never smoked. She has never used smokeless tobacco. She reports that she does not drink alcohol and does not use drugs.   Family History:  The patient's family history includes Allergies in her father and mother; Arthritis in her mother; Asthma in her maternal grandmother; Cancer in her paternal grandfather; Cushing syndrome in her father; Dementia in her paternal grandmother; Depression in her father; Diabetes in her maternal grandmother; Drug abuse in her mother; Heart attack in her father and maternal grandfather; Heart murmur in her mother; Hypertension in her maternal grandmother.   ROS:  Please see the history of present illness.   All other systems are personally reviewed and negative.   Recent Labs: 04/12/2021: Hemoglobin 10.7; Platelets 187 08/30/2021: B Natriuretic Peptide 50.9; BUN 12; Creatinine, Ser 0.86; Potassium 3.4; Sodium 141  Personally reviewed   Wt Readings from Last 3 Encounters:  09/10/21 86 kg (189 lb 9.6 oz)  09/06/21 87.1 kg (192 lb)  09/05/21 86.7 kg (191 lb 3.2 oz)    BP 138/90   Wt 86 kg (189 lb 9.6 oz)   BMI 30.60 kg/m   Physical Exam: General:  NAD. No resp difficulty HEENT: Normal Neck: Supple. No JVD. Carotids 2+ bilat; no bruits. No lymphadenopathy or thryomegaly appreciated. Cor: PMI nondisplaced. Regular rate & rhythm. No rubs, gallops or murmurs. Lungs: Clear Abdomen: Soft, nontender, nondistended. No hepatosplenomegaly. No bruits or masses. Good bowel sounds. Extremities: No cyanosis, clubbing, rash, edema Neuro: Alert & oriented x 3, cranial nerves grossly intact. Moves all 4 extremities w/o difficulty. Affect pleasant.  Device Interrogation: HL Score 7, 1.7 hrs/day activity, no shocks (personally reviewed).  ASSESSMENT AND PLAN:  1. Chronic Systolic Heart Failure - NICM. Cath 10/11/16 with normal cors.  - cMRI in 02/2015 showed EF 46%. No LGE. - CPX 10/2016: Peak VO2: 18.5 (82% predicted peak VO2), VE/VCO2 slope: 30.  - Echo  12/18 EF 25-30%  - S/P [employer] HeartLogic ICD 01/2018.  - Repeat CPX 9/19 with mild HR limitation. - Echo 02/12/19: EF 40-45% - Echo 8/21: EF ~  40%-45%, Grade II DD, moderate MR. - NYHA III. Volume good on exam and HL Score. - Continue torsemide to 40 mg daily + extra 20 mEq of KCl. - Continue spiro 25 mg bid. - Continue Coreg 25 mg bid. - Continue Entresto 97/103 mg bid.  - Off Farxiga due to recurrent yeast infections. - Continue hydralazine 100 mg tid. Not on Imdur due to headaches.  - She has high symptom burden. Plan to repeat echo and consider CPX before evaluating for advanced therapies. ? Autoimmune component for her current symptoms. - She had been consented for cardiomems, but will plan to monitor her with HeartLogic device for now unless she has frequent hospitalizations for CHF. - BMET today.  2. HTN  - Better today, but remains elevated at home. - Start amlodipine 5 mg daily.  - Continue to check BP at home and bring log to next visit. - Consider referral to HTN clinic if not at goal after addition of amlodipine. - Update sleep study.  3. Sinus tach vs atrial flutter on ICD - Zio patch 9/20 Rare PVCs and PAcs. - No VT on ICD or events on formal interrogation at EP visit 09/05/21 - Consider repeat Zio if no improvement after diuresis and BP med titration.  4. OSA - Not wearing CPAP.  - Update sleep study.  5. DM2  - Per PCP. She is using a Dexcom. - Off Farxiga due to recurrent yeast infections.  Follow up with Dr. Haroldine Laws + echo next month. Consider re-peat CPX if she is able with right knee replacement issues.  Frankey Poot, FNP  09/10/2021 10:52 AM  Advanced Heart Failure [address] [address] [phone] (office) [phone] (fax)

## 2021-09-10 ENCOUNTER — Other Ambulatory Visit: Payer: Self-pay

## 2021-09-10 ENCOUNTER — Ambulatory Visit (HOSPITAL_COMMUNITY)
Admission: RE | Admit: 2021-09-10 | Discharge: 2021-09-10 | Disposition: A | Discharge status: Home / Self Care | Payer: Medicare Other | Source: Ambulatory Visit | Attending: Family Medicine | Admitting: Family Medicine

## 2021-09-10 ENCOUNTER — Encounter (HOSPITAL_COMMUNITY): Payer: Self-pay

## 2021-09-10 ENCOUNTER — Ambulatory Visit (INDEPENDENT_AMBULATORY_CARE_PROVIDER_SITE_OTHER): Payer: Medicare Other

## 2021-09-10 ENCOUNTER — Telehealth: Payer: Medicare Other

## 2021-09-10 VITALS — BP 138/90 | Wt 189.6 lb

## 2021-09-10 DIAGNOSIS — Z7951 Long term (current) use of inhaled steroids: Secondary | ICD-10-CM | POA: Diagnosis not present

## 2021-09-10 DIAGNOSIS — Z888 Allergy status to other drugs, medicaments and biological substances status: Secondary | ICD-10-CM | POA: Insufficient documentation

## 2021-09-10 DIAGNOSIS — I10 Essential (primary) hypertension: Secondary | ICD-10-CM | POA: Diagnosis not present

## 2021-09-10 DIAGNOSIS — Z7984 Long term (current) use of oral hypoglycemic drugs: Secondary | ICD-10-CM | POA: Insufficient documentation

## 2021-09-10 DIAGNOSIS — Z885 Allergy status to narcotic agent status: Secondary | ICD-10-CM | POA: Diagnosis not present

## 2021-09-10 DIAGNOSIS — J4520 Mild intermittent asthma, uncomplicated: Secondary | ICD-10-CM

## 2021-09-10 DIAGNOSIS — I110 Hypertensive heart disease with heart failure: Secondary | ICD-10-CM | POA: Diagnosis not present

## 2021-09-10 DIAGNOSIS — Z95810 Presence of automatic (implantable) cardiac defibrillator: Secondary | ICD-10-CM | POA: Diagnosis not present

## 2021-09-10 DIAGNOSIS — M797 Fibromyalgia: Secondary | ICD-10-CM | POA: Insufficient documentation

## 2021-09-10 DIAGNOSIS — R000 Tachycardia, unspecified: Secondary | ICD-10-CM | POA: Diagnosis not present

## 2021-09-10 DIAGNOSIS — Z8673 Personal history of transient ischemic attack (TIA), and cerebral infarction without residual deficits: Secondary | ICD-10-CM | POA: Diagnosis not present

## 2021-09-10 DIAGNOSIS — E785 Hyperlipidemia, unspecified: Secondary | ICD-10-CM | POA: Insufficient documentation

## 2021-09-10 DIAGNOSIS — M069 Rheumatoid arthritis, unspecified: Secondary | ICD-10-CM

## 2021-09-10 DIAGNOSIS — Z8616 Personal history of COVID-19: Secondary | ICD-10-CM | POA: Insufficient documentation

## 2021-09-10 DIAGNOSIS — I428 Other cardiomyopathies: Secondary | ICD-10-CM | POA: Insufficient documentation

## 2021-09-10 DIAGNOSIS — Z91040 Latex allergy status: Secondary | ICD-10-CM | POA: Insufficient documentation

## 2021-09-10 DIAGNOSIS — E119 Type 2 diabetes mellitus without complications: Secondary | ICD-10-CM | POA: Insufficient documentation

## 2021-09-10 DIAGNOSIS — I5022 Chronic systolic (congestive) heart failure: Secondary | ICD-10-CM | POA: Diagnosis not present

## 2021-09-10 DIAGNOSIS — Z8249 Family history of ischemic heart disease and other diseases of the circulatory system: Secondary | ICD-10-CM | POA: Insufficient documentation

## 2021-09-10 DIAGNOSIS — M329 Systemic lupus erythematosus, unspecified: Secondary | ICD-10-CM

## 2021-09-10 DIAGNOSIS — Z791 Long term (current) use of non-steroidal anti-inflammatories (NSAID): Secondary | ICD-10-CM | POA: Insufficient documentation

## 2021-09-10 DIAGNOSIS — E118 Type 2 diabetes mellitus with unspecified complications: Secondary | ICD-10-CM

## 2021-09-10 DIAGNOSIS — Z881 Allergy status to other antibiotic agents status: Secondary | ICD-10-CM | POA: Insufficient documentation

## 2021-09-10 DIAGNOSIS — Z7982 Long term (current) use of aspirin: Secondary | ICD-10-CM | POA: Insufficient documentation

## 2021-09-10 DIAGNOSIS — Z794 Long term (current) use of insulin: Secondary | ICD-10-CM | POA: Insufficient documentation

## 2021-09-10 DIAGNOSIS — E1169 Type 2 diabetes mellitus with other specified complication: Secondary | ICD-10-CM

## 2021-09-10 DIAGNOSIS — Z7952 Long term (current) use of systemic steroids: Secondary | ICD-10-CM | POA: Insufficient documentation

## 2021-09-10 DIAGNOSIS — G4733 Obstructive sleep apnea (adult) (pediatric): Secondary | ICD-10-CM

## 2021-09-10 DIAGNOSIS — Z79899 Other long term (current) drug therapy: Secondary | ICD-10-CM | POA: Insufficient documentation

## 2021-09-10 LAB — BASIC METABOLIC PANEL
Anion gap: 9 (ref 5–15)
BUN: 11 mg/dL (ref 6–20)
CO2: 28 mmol/L (ref 22–32)
Calcium: 9.4 mg/dL (ref 8.9–10.3)
Chloride: 105 mmol/L (ref 98–111)
Creatinine, Ser: 1.06 mg/dL — ABNORMAL HIGH (ref 0.44–1.00)
GFR, Estimated: >60 mL/min (ref >60)
Glucose, Bld: 102 mg/dL — ABNORMAL HIGH (ref 70–99)
Potassium: 4.6 mmol/L (ref 3.5–5.1)
Sodium: 142 mmol/L (ref 135–145)

## 2021-09-10 MED ORDER — AMLODIPINE BESYLATE 5 MG PO TABS: 5.0000 mg | ORAL_TABLET | Freq: Every day | ORAL | 2 refills | Status: DC

## 2021-09-10 NOTE — Patient Instructions (Addendum)
Labs were done today, if any labs are abnormal the clinic will call you  START Amlodipine 5 mg 1 tablet daily  You were setup for a sleep study today, you will be contacted when to start   Your physician recommends that you schedule a follow-up appointment in: as scheduled   At the [facility], you and your health needs are our priority. As part of our continuing mission to provide you with exceptional heart care, we have created designated Provider Care Teams. These Care Teams include your primary Cardiologist (physician) and Advanced Practice Providers (APPs- Physician Assistants and Nurse Practitioners) who all work together to provide you with the care you need, when you need it.   You may see any of the following providers on your designated Care Team at your next follow up: Dr Glori Bickers Dr Loralie Champagne Dr Patrice Paradise, NP Lyda Jester, [place] Ginnie Smart Audry Riles, PharmD   Please be sure to bring in all your medications bottles to every appointment.   If you have any questions or concerns before your next appointment please send [place] a message through [place] or call our office at [phone].    TO LEAVE A MESSAGE FOR THE NURSE SELECT OPTION 2, PLEASE LEAVE A MESSAGE INCLUDING: YOUR NAME DATE OF BIRTH CALL BACK NUMBER REASON FOR CALL**this is important as we prioritize the call backs  YOU WILL RECEIVE A CALL BACK THE SAME DAY AS LONG AS YOU CALL BEFORE 4:00 PM

## 2021-09-11 DIAGNOSIS — E1165 Type 2 diabetes mellitus with hyperglycemia: Secondary | ICD-10-CM | POA: Diagnosis not present

## 2021-09-12 DIAGNOSIS — T8484XD Pain due to internal orthopedic prosthetic devices, implants and grafts, subsequent encounter: Secondary | ICD-10-CM | POA: Diagnosis not present

## 2021-09-12 DIAGNOSIS — M25561 Pain in right knee: Secondary | ICD-10-CM | POA: Diagnosis not present

## 2021-09-12 DIAGNOSIS — M329 Systemic lupus erythematosus, unspecified: Secondary | ICD-10-CM | POA: Diagnosis not present

## 2021-09-12 DIAGNOSIS — M0579 Rheumatoid arthritis with rheumatoid factor of multiple sites without organ or systems involvement: Secondary | ICD-10-CM | POA: Diagnosis not present

## 2021-09-12 DIAGNOSIS — M351 Other overlap syndromes: Secondary | ICD-10-CM | POA: Diagnosis not present

## 2021-09-12 DIAGNOSIS — Z96651 Presence of right artificial knee joint: Secondary | ICD-10-CM | POA: Diagnosis not present

## 2021-09-12 DIAGNOSIS — Z79899 Other long term (current) drug therapy: Secondary | ICD-10-CM | POA: Diagnosis not present

## 2021-09-13 DIAGNOSIS — Z23 Encounter for immunization: Secondary | ICD-10-CM | POA: Diagnosis not present

## 2021-09-13 DIAGNOSIS — T85848A Pain due to other internal prosthetic devices, implants and grafts, initial encounter: Secondary | ICD-10-CM | POA: Diagnosis not present

## 2021-09-13 DIAGNOSIS — G4733 Obstructive sleep apnea (adult) (pediatric): Secondary | ICD-10-CM | POA: Diagnosis not present

## 2021-09-13 DIAGNOSIS — E119 Type 2 diabetes mellitus without complications: Secondary | ICD-10-CM | POA: Diagnosis not present

## 2021-09-13 DIAGNOSIS — J45909 Unspecified asthma, uncomplicated: Secondary | ICD-10-CM | POA: Diagnosis not present

## 2021-09-13 DIAGNOSIS — D688 Other specified coagulation defects: Secondary | ICD-10-CM | POA: Diagnosis not present

## 2021-09-13 DIAGNOSIS — Z794 Long term (current) use of insulin: Secondary | ICD-10-CM | POA: Diagnosis not present

## 2021-09-13 DIAGNOSIS — M329 Systemic lupus erythematosus, unspecified: Secondary | ICD-10-CM | POA: Diagnosis not present

## 2021-09-13 DIAGNOSIS — Z79899 Other long term (current) drug therapy: Secondary | ICD-10-CM | POA: Diagnosis not present

## 2021-09-13 DIAGNOSIS — Z7982 Long term (current) use of aspirin: Secondary | ICD-10-CM | POA: Diagnosis not present

## 2021-09-13 DIAGNOSIS — I5042 Chronic combined systolic (congestive) and diastolic (congestive) heart failure: Secondary | ICD-10-CM | POA: Diagnosis not present

## 2021-09-13 DIAGNOSIS — Z8616 Personal history of COVID-19: Secondary | ICD-10-CM | POA: Diagnosis not present

## 2021-09-13 DIAGNOSIS — M069 Rheumatoid arthritis, unspecified: Secondary | ICD-10-CM | POA: Diagnosis not present

## 2021-09-13 DIAGNOSIS — Z7951 Long term (current) use of inhaled steroids: Secondary | ICD-10-CM | POA: Diagnosis not present

## 2021-09-13 DIAGNOSIS — I110 Hypertensive heart disease with heart failure: Secondary | ICD-10-CM | POA: Diagnosis not present

## 2021-09-13 DIAGNOSIS — M2012 Hallux valgus (acquired), left foot: Secondary | ICD-10-CM | POA: Diagnosis not present

## 2021-09-13 DIAGNOSIS — E785 Hyperlipidemia, unspecified: Secondary | ICD-10-CM | POA: Diagnosis not present

## 2021-09-13 DIAGNOSIS — M25775 Osteophyte, left foot: Secondary | ICD-10-CM | POA: Diagnosis not present

## 2021-09-14 DIAGNOSIS — Z794 Long term (current) use of insulin: Secondary | ICD-10-CM | POA: Diagnosis not present

## 2021-09-14 DIAGNOSIS — Z23 Encounter for immunization: Secondary | ICD-10-CM | POA: Diagnosis not present

## 2021-09-14 DIAGNOSIS — M2012 Hallux valgus (acquired), left foot: Secondary | ICD-10-CM | POA: Diagnosis not present

## 2021-09-14 DIAGNOSIS — I110 Hypertensive heart disease with heart failure: Secondary | ICD-10-CM | POA: Diagnosis not present

## 2021-09-14 DIAGNOSIS — J45909 Unspecified asthma, uncomplicated: Secondary | ICD-10-CM | POA: Diagnosis not present

## 2021-09-14 DIAGNOSIS — Z7982 Long term (current) use of aspirin: Secondary | ICD-10-CM | POA: Diagnosis not present

## 2021-09-14 DIAGNOSIS — Z79899 Other long term (current) drug therapy: Secondary | ICD-10-CM | POA: Diagnosis not present

## 2021-09-14 DIAGNOSIS — E119 Type 2 diabetes mellitus without complications: Secondary | ICD-10-CM | POA: Diagnosis not present

## 2021-09-14 DIAGNOSIS — I5042 Chronic combined systolic (congestive) and diastolic (congestive) heart failure: Secondary | ICD-10-CM | POA: Diagnosis not present

## 2021-09-14 DIAGNOSIS — Z8616 Personal history of COVID-19: Secondary | ICD-10-CM | POA: Diagnosis not present

## 2021-09-14 DIAGNOSIS — D688 Other specified coagulation defects: Secondary | ICD-10-CM | POA: Diagnosis not present

## 2021-09-14 DIAGNOSIS — Z472 Encounter for removal of internal fixation device: Secondary | ICD-10-CM | POA: Diagnosis not present

## 2021-09-14 DIAGNOSIS — M069 Rheumatoid arthritis, unspecified: Secondary | ICD-10-CM | POA: Diagnosis not present

## 2021-09-14 DIAGNOSIS — E785 Hyperlipidemia, unspecified: Secondary | ICD-10-CM | POA: Diagnosis not present

## 2021-09-14 DIAGNOSIS — M329 Systemic lupus erythematosus, unspecified: Secondary | ICD-10-CM | POA: Diagnosis not present

## 2021-09-14 DIAGNOSIS — G4733 Obstructive sleep apnea (adult) (pediatric): Secondary | ICD-10-CM | POA: Diagnosis not present

## 2021-09-14 DIAGNOSIS — Z7951 Long term (current) use of inhaled steroids: Secondary | ICD-10-CM | POA: Diagnosis not present

## 2021-09-14 DIAGNOSIS — M25775 Osteophyte, left foot: Secondary | ICD-10-CM | POA: Diagnosis not present

## 2021-09-14 NOTE — Patient Instructions (Signed)
Visit Information  PATIENT GOALS:  Goals Addressed       Patient Stated     Asthma complications prevented or minimized (pt-stated)   On track     Timeframe:  Long-Range Goal Priority:  High Start Date: 12/27/20                            Expected End Date: 12/27/21  Next Follow up date: 10/15/21  Patient Goals/Self Care Activities:  - call PCP for concerns or to report new or worsening symptoms - continue to stay well hydrated - continue to use inhalers as directed  - avoid any known triggers that may exacerbate Asthma - continue Nasacort or Flonase for symptom management per recommendations of PCP                          Manage SLE and RA symptoms (pt-stated)   On track     Timeframe:  Long-Range Goal Priority:  High Start Date:  12/27/20                           Expected End Date:  12/27/21   Next Follow up date: 10/15/21  - call Rheumatologist with concerns or worsening symptoms related to SLE and or RA  - take all medications exactly as prescribed - keep all scheduled MD appointments  - resume Lupus/RA medications as directed                           Other     Begin and Stick with Counseling-Depression        Timeframe:  Long-Range Goal Priority:  High Start Date:  03/21/21                            Expected End Date:  03/21/22                     Follow Up Date: 10/15/21    Self Care Activities:  Continue to keep all scheduled follow up appointments Take medications as directed  Let your healthcare team know if you are unable to take your medications Call your pharmacy for refills at least 7 days prior to running out of medication Work with the embedded BSW regarding resources to help you learn how to register your pet as an emotional support animal Patient Goals: - to start grief counseling and have my puppy registered as an emotional support dog   Why is this important?   Beating depression may take some time.  If you don't feel better right away, don't  give up on your treatment plan.    Notes:       Left Great Toe Fusion Metatarsophalangeal   On track     Timeframe:  Long-Range Goal Priority:  High Start Date:  09/10/21                            Expected End Date:  03/10/22     Next Scheduled Follow up date: 10/15/21        Patient Goals: - obtain a knee scooter                  Monitor and Manage My Blood Sugar-Diabetes Type 2  On track     Timeframe:  Long-Range Goal Priority:  High Start Date: 12/27/20                            Expected End Date:  12/27/21                     Follow Up Date: 10/15/21   - check blood sugar at prescribed times - check blood sugar before and after exercise - check blood sugar if I feel it is too high or too low - enter blood sugar readings and medication or insulin into daily log - take the blood sugar log to all doctor visits - take the blood sugar meter to all doctor visits  - follow sliding scale per PCP recommendations for treatment of hyperglycemia during use of steroids   Why is this important?   Checking your blood sugar at home helps to keep it from getting very high or very low.  Writing the results in a diary or log helps the doctor know how to care for you.  Your blood sugar log should have the time, date and the results.  Also, write down the amount of insulin or other medicine that you take.  Other information, like what you ate, exercise done and how you were feeling, will also be helpful.     Notes:       Track and Manage My Blood Pressure-Hypertension   On track     Timeframe:  Long-Range Goal Priority:  High Start Date:  12/27/20                          Expected End Date:  12/27/21                    Follow Up Date: 10/15/21   - check blood pressure 3 times per week - write blood pressure results in a log or diary    Why is this important?   You won't feel high blood pressure, but it can still hurt your blood vessels.  High blood pressure can cause heart or kidney  problems. It can also cause a stroke.  Making lifestyle changes like losing a Josua Ferrebee weight or eating less salt will help.  Checking your blood pressure at home and at different times of the day can help to control blood pressure.  If the doctor prescribes medicine remember to take it the way the doctor ordered.  Call the office if you cannot afford the medicine or if there are questions about it.     Notes:         The patient verbalized understanding of instructions, educational materials, and care plan provided today and declined offer to receive copy of patient instructions, educational materials, and care plan.   Telephone follow up appointment with care management team member scheduled for: 10/15/21  Barb Merino, RN, BSN, CCM Care Management Coordinator [facility]  Direct Phone: [phone]

## 2021-09-14 NOTE — Chronic Care Management (AMB) (Signed)
Chronic Care Management   CCM RN Visit Note  09/10/2021 Name: Christine Mcdowell MRN: [id number] DOB: August 14, 1971  Subjective: Christine Mcdowell is a 50 y.o. year old female who is a primary care patient of [name], [place]. The care management team was consulted for assistance with disease management and care coordination needs.    Engaged with patient by telephone for follow up visit in response to provider referral for case management and/or care coordination services.   Consent to Services:  The patient was given information about Chronic Care Management services, agreed to services, and gave verbal consent prior to initiation of services.  Please see initial visit note for detailed documentation.   Patient agreed to services and verbal consent obtained.   Assessment: Review of patient past medical history, allergies, medications, health status, including review of consultants reports, laboratory and other test data, was performed as part of comprehensive evaluation and provision of chronic care management services.   SDOH (Social Determinants of Health) assessments and interventions performed:  Yes, no acute challenges   CCM Care Plan  Allergies  Allergen Reactions   Hydrocodone-Acetaminophen Nausea And Vomiting   Hydromorphone Nausea And Vomiting    Other reaction(s): GI Upset (intolerance), Hypertension (intolerance) Raises blood pressure  Other reaction(s): GI Upset (intolerance), Hypertension (intolerance) Raises blood pressure to stroke level   Iodinated Diagnostic Agents Other (See Comments)    Shuts down kidneys Shuts kidney function down   Other Other (See Comments) and Anaphylaxis    Spicy foods and seasonings Skin Prep "makes my skin peel off" Paper tape causes skin burns   Erythromycin Nausea And Vomiting   Latex Hives   Rinvoq [Upadacitinib] Other (See Comments)   Farxiga [Dapagliflozin] Other (See Comments)   Tape Other (See Comments)    "Skin burns"    Mircette [Desogestrel-Ethinyl Estradiol] Nausea And Vomiting and Rash    Outpatient Encounter Medications as of 09/10/2021  Medication Sig   albuterol (ACCUNEB) 1.25 MG/3ML nebulizer solution Take 3 mLs (1.25 mg total) by nebulization every 6 (six) hours as needed for wheezing. (Patient taking differently: Take 1 ampule by nebulization 3 (three) times daily as needed for wheezing.)   amLODipine (NORVASC) 5 MG tablet Take 1 tablet (5 mg total) by mouth daily.   aspirin 81 MG chewable tablet Chew 162 mg by mouth daily. Taking 2 tablets   atorvastatin (LIPITOR) 10 MG tablet Take 1 tablet (10 mg total) by mouth daily.   budesonide-formoterol (SYMBICORT) 80-4.5 MCG/ACT inhaler Inhale 2 puffs into the lungs in the morning and at bedtime.   carvedilol (COREG) 25 MG tablet TAKE ONE TABLET BY MOUTH EVERY MORNING and TAKE ONE TABLET BY MOUTH EVERY EVENING   Continuous Blood Gluc Receiver (DEXCOM G6 RECEIVER) DEVI 1 Device by Does not apply route 3 (three) times daily before meals.   Continuous Blood Gluc Sensor (DEXCOM G6 SENSOR) MISC Inject 1 each into the skin 3 (three) times daily.   Continuous Blood Gluc Transmit (DEXCOM G6 TRANSMITTER) MISC 1 Device by Does not apply route 3 (three) times daily before meals.   ENTRESTO 97-103 MG TAKE ONE TABLET BY MOUTH TWICE DAILY   folic acid (FOLVITE) 1 MG tablet TAKE 1 TABLET BY MOUTH EVERY MORNING   Glucagon (GVOKE HYPOPEN 2-PACK) 1 MG/0.2ML SOAJ Inject 1 mg into the skin as needed.   hydrALAZINE (APRESOLINE) 25 MG tablet Take 1 tablet (25 mg total) by mouth 3 (three) times daily. Please schedule appointment for future refills. Thank you (Patient taking differently: Take  100 mg by mouth 3 (three) times daily. Please schedule appointment for future refills. Thank you)   hydroxychloroquine (PLAQUENIL) 200 MG tablet Take 1 tablet (200 mg total) by mouth 2 (two) times daily.   insulin lispro (HUMALOG KWIKPEN) 100 UNIT/ML KwikPen Use according to sliding scale    Insulin Pen Needle (NOVOFINE PLUS PEN NEEDLE) 32G X 4 MM MISC Use with insulin pens dx code e11.65   Magnesium 400 MG TABS Take 1 tablet by mouth daily.    meloxicam (MOBIC) 15 MG tablet Take 15 mg by mouth daily.    metFORMIN (GLUCOPHAGE) 500 MG tablet TAKE ONE TABLET BY MOUTH TWICE DAILY   methotrexate (RHEUMATREX) 2.5 MG tablet Take 20 mg by mouth every Friday.    metolazone (ZAROXOLYN) 2.5 MG tablet TAKE 1 TABLET BY MOUTH AS NEEDED FOR WEIGHT GAIN OF 5 POUNDS WITHIN 3 DAYS AS DIRECTED needs appt for further refills   montelukast (SINGULAIR) 10 MG tablet TAKE ONE TABLET BY MOUTH EVERY MORNING   Multiple Vitamin (MULTIVITAMIN WITH MINERALS) TABS Take 1 tablet by mouth every morning.    ondansetron (ZOFRAN-ODT) 4 MG disintegrating tablet DISSOLVE ONE TABLET UNDER THE TONGUE EVERY TWELVE HOURS AS NEEDED   PARoxetine (PAXIL) 10 MG tablet TAKE ONE TABLET BY MOUTH EVERY MORNING   potassium chloride SA (KLOR-CON) 10 MEQ tablet Take 3 tablets (30 mEq total) by mouth 2 (two) times daily.   predniSONE (DELTASONE) 20 MG tablet Take 20 mg by mouth daily with breakfast.   PROAIR HFA 108 (90 Base) MCG/ACT inhaler INHALE 2 PUFFS BY MOUTH EVERY DAY AS NEEDED FOR SHORTNESS OF BREATH   spironolactone (ALDACTONE) 25 MG tablet TAKE ONE TABLET BY MOUTH EVERY MORNING and TAKE ONE TABLET BY MOUTH EVERY EVENING last refill without office visit please call [phone]   torsemide (DEMADEX) 20 MG tablet Take 2 tablets (40 mg total) by mouth daily.   traZODone (DESYREL) 50 MG tablet Take 50 mg by mouth at bedtime as needed.   Vitamin D, Ergocalciferol, (DRISDOL) 1.25 MG (50000 UNIT) CAPS capsule TAKE ONE CAPSULE BY MOUTH ONCE WEEKLY ON TUESDAY and TAKE ONE CAPSULE BY MOUTH ONCE WEEKLY ON FRIDAY   No facility-administered encounter medications on file as of 09/10/2021.    Patient Active Problem List   Diagnosis Date Noted   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   History of COVID-19  04/12/2020   COVID-19 virus infection 03/07/2020   Lower abdominal pain 06/17/9484   Chronic systolic (congestive) heart failure ([place]) 01/28/2018   Cough 01/19/2018   Pulmonary infiltrates on CXR 01/19/2018   Migraines 10/08/2017   Sleep apnea with use of continuous positive airway pressure (CPAP) 09/24/2017   Rheumatoid arthritis ([place]) 10/09/2016   Asthma 10/09/2016   Chronic pain 10/09/2016   Heart failure ([place]) 10/09/2016   Congestive heart failure ([place])    Lupus (systemic lupus erythematosus) ([place])    Diabetes mellitus with complication (HCC)    Anxiety state    GERD (gastroesophageal reflux disease) 12/28/2015   Essential hypertension 12/26/2015   Type 2 diabetes mellitus ([place]) 46/27/0350   Chronic systolic heart failure ([place]) 08/18/2014   Cardiomyopathy, dilated ([place]) 01/27/2014   Shortness of breath 01/26/2014   SLE (systemic lupus erythematosus) ([place]) 01/26/2014    Conditions to be addressed/monitored: CHF, DM II, HTN, SLE, RA, Asthma   Care Plan : Hypertension (Adult)  Updates made by [name], RN since 09/10/2021 12:00 AM     Problem: Disease Progression (Hypertension)   Priority: High  Long-Range Goal: Disease Progression Prevented or Minimized   Start Date: 12/27/2020  Expected End Date: 12/27/2021  Recent Progress: On track  Priority: High  Note:   Objective:  Last practice recorded BP readings:  BP Readings from Last 3 Encounters:  09/10/21 138/90  09/05/21 136/80  08/30/21 (!) 168/100  Most recent eGFR/CrCl: No results found for: EGFR  No components found for: CRCL Current Barriers:  Knowledge Deficits related to basic understanding of hypertension pathophysiology and self care management Knowledge Deficits related to understanding of medications prescribed for management of hypertension Case Manager Clinical Goal(s):  Patient will demonstrate improved adherence to prescribed treatment plan for hypertension as evidenced by taking all medications  as prescribed, monitoring and recording blood pressure as directed, adhering to low sodium/DASH diet Interventions:  09/10/21 completed successful outbound call with patient  Collaboration with [name] FNP regarding development and update of comprehensive plan of care as evidenced by provider attestation and co-signature Inter-disciplinary care team collaboration (see longitudinal plan of care) Evaluation of current treatment plan related to hypertension self management and patient's adherence to plan as established by provider. Advised patient, providing education and rationale, to monitor blood pressure daily and record, calling PCP for findings outside established parameters.  Reviewed medications with patient and discussed importance of compliance Educated on dietary and exercise recommendations Reviewed and discussed recent Cardiac follow up completed on 09/10/21 and MD recommendation:  Labs were done today, if any labs are abnormal the clinic will call you   START Amlodipine 5 mg 1 tablet daily   You were setup for a sleep study today, you will be contacted when to start    Your physician recommends that you schedule a follow-up appointment in: as scheduled    At the [facility], you and your health needs are our priority. As part of our continuing mission to provide you with exceptional heart care, we have created designated Provider Care Teams. These Care Teams include your primary Cardiologist (physician) and Advanced Practice Providers (APPs- Physician Assistants and Nurse Practitioners) who all work together to provide you with the care you need, when you need it.    You may see any of the following providers on your designated Care Team at your next follow up: Dr Glori Bickers Dr Loralie Champagne Dr Patrice Paradise, NP Lyda Jester, [place] Ginnie Smart Audry Riles, PharmD     Please be sure to bring in all your medications bottles to  every appointment.    If you have any questions or concerns before your next appointment please send [place] a message through [place] or call our office at [phone].     TO LEAVE A MESSAGE FOR THE NURSE SELECT OPTION 2, PLEASE LEAVE A MESSAGE INCLUDING: YOUR NAME DATE OF BIRTH CALL BACK NUMBER REASON FOR CALL**this is important as we prioritize the call backs   YOU WILL RECEIVE A CALL BACK THE SAME DAY AS LONG AS YOU CALL BEFORE 4:00 PM       Instructions  Labs were done today, if any labs are abnormal the clinic will call you   START Amlodipine 5 mg 1 tablet daily   You were setup for a sleep study today, you will be contacted when to start    Your physician recommends that you schedule a follow-up appointment in: as scheduled    At the [facility], you and your health needs are our priority. As part of our continuing mission to provide you  with exceptional heart care, we have created designated Provider Care Teams. These Care Teams include your primary Cardiologist (physician) and Advanced Practice Providers (APPs- Physician Assistants and Nurse Practitioners) who all work together to provide you with the care you need, when you need it.    You may see any of the following providers on your designated Care Team at your next follow up: Dr Glori Bickers Dr Loralie Champagne Dr Patrice Paradise, NP Lyda Jester, [place] Ginnie Smart Audry Riles, PharmD     Please be sure to bring in all your medications bottles to every appointment.    If you have any questions or concerns before your next appointment please send [place] a message through [place] or call our office at [phone].     TO LEAVE A MESSAGE FOR THE NURSE SELECT OPTION 2, PLEASE LEAVE A MESSAGE INCLUDING: YOUR NAME DATE OF BIRTH CALL BACK NUMBER REASON FOR CALL**this is important as we prioritize the call backs    Determined patient verbalizes understanding of her prescribed  treatment plan  Discussed plans with patient for ongoing care management follow up and provided patient with direct contact information for care management team Patient Goals/Self-Care Activities - check blood sugar at prescribed times - check blood sugar before and after exercise - check blood sugar if I feel it is too high or too low - enter blood sugar readings and medication or insulin into daily log - take the blood sugar log to all doctor visits - take the blood sugar meter to all doctor visits    Follow Up Plan: Telephone follow up appointment with care management team member scheduled for: 10/15/21      Care Plan : Auto-immune Disorder  Updates made by [name], RN since 09/10/2021 12:00 AM     Problem: Auto-immune disorder, specifically SLE and RA   Priority: High     Long-Range Goal: Manage symptoms of auto-immune disorder - Lupus   Start Date: 12/27/2020  Expected End Date: 12/27/2021  Recent Progress: On track  Priority: High  Note:   Current Barriers:  Ineffective Self Health Maintenance Currently UNABLE TO independently self manage needs related to chronic health conditions.  Knowledge Deficits related to short term plan for care coordination needs and long term plans for chronic disease management needs Nurse Case Manager Clinical Goal(s):  Patient will work with care management team to address care coordination and chronic disease management needs related to Disease Management Educational Needs Care Coordination Medication Management and Education Psychosocial Support   Interventions:  09/10/21 completed successful outbound call completed with patient  Collaboration with [name] FNP regarding development and update of comprehensive plan of care as evidenced by provider attestation and co-signature Inter-disciplinary care team collaboration (see longitudinal plan of care) Evaluation of current treatment plan related to Lupus and RA self management and  patient's adherence to plan as established by provider. Determined patient's Lupus/RA medications are currently on hold pending foot surgery, she will resume meds following her post op follow up if appropriate and wound healing is evident Discussed plans with patient for ongoing care management follow up and provided patient with direct contact information for care management team Patient Goals/Self-Care Activities:  Continue to keep all scheduled follow up appointments Take medications as directed  Let your healthcare team know if you are unable to take your medications Call your pharmacy for refills at least 7 days prior to running out of medication Notify your Rheumatologist promptly with new or worsening  symptoms related to Lupus Patient Goal:  - resume Lupus medications as directed following foot surgery  Follow Up Plan: Telephone follow up appointment with care management team member scheduled for: 10/15/21     Care Plan : Depression (Adult)  Updates made by [name], RN since 09/10/2021 12:00 AM     Problem: Symptoms (Depression)   Priority: High     Long-Range Goal: Symptoms Monitored and Managed   Start Date: 03/21/2021  Expected End Date: 03/21/2022  Recent Progress: On track  Priority: High  Note:   Current Barriers:  Ineffective Self Health Maintenance Grief over recent loss of close friend  Clinical Goal(s):  Collaboration with [name], MD regarding development and update of comprehensive plan of care as evidenced by provider attestation and co-signature Inter-disciplinary care team collaboration (see longitudinal plan of care) patient will work with care management team to address care coordination and chronic disease management needs related to Disease Management Educational Needs Care Coordination Medication Management and Education Psychosocial Support   Interventions:  Evaluation of current treatment plan related to Depression, self-management  and patient's adherence to plan as established by provider. Collaboration with [name], MD regarding development and update of comprehensive plan of care as evidenced by provider attestation       and co-signature Inter-disciplinary care team collaboration (see longitudinal plan of care) Determined patient continues to suffer from depression following the recent loss of her best friend and roommate Determined patient notified her PCP of her ongoing depression and will f/u with PCP provider [name] FNP on 03/27/21 _0 :00 AM Determined patient is also experiencing other life events adding to her stress and depression concerning several family members including her son and mother Discussed patient's aunt purchased her a new puppy for emotional support that has brought her some happiness and joy Discussed patient's wish to have her dog registered as an emotional support animal so she may take her pet with her to most all public places as desired Discussed having patient work with the embedded BSW to explore resources on how to get her dog registered with the state Collaborated with embedded [place] regarding patient's request and determined Tillie Rung will follow up with the patient Discussed plans with patient for ongoing care management follow up and provided patient with direct contact information for care management team Self Care Activities:  Continue to keep all scheduled follow up appointments Take medications as directed  Let your healthcare team know if you are unable to take your medications Call your pharmacy for refills at least 7 days prior to running out of medication Work with the embedded BSW regarding resources to help you learn how to register your pet as an emotional support animal Patient Goals: - to start grief counseling and have my puppy registered as an emotional support dog  Follow Up Plan: Telephone follow up appointment with care management team member  scheduled for: 10/15/21     Care Plan : Left Great Toe Fusion Metatarsophalangeal  Updates made by [name], RN since 09/10/2021 12:00 AM     Problem: Left Great Toe Fusion Metatarsophalangeal   Priority: High     Long-Range Goal: Left Great Toe Fusion Metatarsophalangeal   Start Date: 09/10/2021  Expected End Date: 04/10/2022  This Visit's Progress: On track  Priority: High  Note:   Current Barriers:  Ineffective Self Health Maintenance in a patient with    Clinical Goal(s):  Collaboration with [name], FNP regarding development and update  of comprehensive plan of care as evidenced by provider attestation and co-signature Inter-disciplinary care team collaboration (see longitudinal plan of care) patient will work with care management team to address care coordination and chronic disease management needs related to Disease Management Educational Needs Care Coordination Medication Management and Education Medication Reconciliation Psychosocial Support   Interventions:  Evaluation of current treatment plan related to  Left Great Toe Metatarsophalangeal ,  self-management and patient's adherence to plan as established by provider. Collaboration with [name], FNP regarding development and update of comprehensive plan of care as evidenced by provider attestation       and co-signature Inter-disciplinary care team collaboration (see longitudinal plan of care) Determined patient is scheduled for outpatient surgery on 09/13/21 for a Left Great Toe Fusion Metatarsophalangeal Discussed she is expected to non-weight bearing x 6 months following the procedure Assessed for DME needs, determined patient would like to obtain a knee scooter to help her remain mobile while adhering to her non weight bearing status Determine patient's surgeon will provide an Rx for the knee scooter, provided patient with the contact number/fax number for [place] on [facility] in [place] patient's daughter will be available to assist her following surgery as needed  Discussed plans with patient for ongoing care management follow up and provided patient with direct contact information for care management team Self Care Activities:  Self administers medications as prescribed Attends all scheduled provider appointments Calls pharmacy for medication refills Calls provider office for new concerns or questions Patient Goals: - obtain a knee scooter   Follow Up Plan: Telephone follow up appointment with care management team member scheduled for: 10/15/21      Plan:Telephone follow up appointment with care management team member scheduled for:  10/15/21  Barb Merino, RN, BSN, CCM Care Management Coordinator [facility]  Direct Phone: [phone]

## 2021-09-15 DIAGNOSIS — M2012 Hallux valgus (acquired), left foot: Secondary | ICD-10-CM | POA: Diagnosis not present

## 2021-09-15 DIAGNOSIS — Z9889 Other specified postprocedural states: Secondary | ICD-10-CM | POA: Diagnosis not present

## 2021-09-17 ENCOUNTER — Telehealth: Payer: Self-pay

## 2021-09-17 ENCOUNTER — Other Ambulatory Visit (HOSPITAL_COMMUNITY): Payer: Self-pay | Admitting: *Deleted

## 2021-09-17 ENCOUNTER — Other Ambulatory Visit: Payer: Self-pay

## 2021-09-17 ENCOUNTER — Telehealth (HOSPITAL_COMMUNITY): Payer: Self-pay | Admitting: Licensed Clinical Social Worker

## 2021-09-17 MED ORDER — SPIRONOLACTONE 25 MG PO TABS: ORAL_TABLET | ORAL | 3 refills | Status: DC

## 2021-09-17 MED ORDER — HYDRALAZINE HCL 25 MG PO TABS: 25.0000 mg | ORAL_TABLET | Freq: Three times a day (TID) | ORAL | 3 refills | Status: DC

## 2021-09-17 MED ORDER — CARVEDILOL 25 MG PO TABS: ORAL_TABLET | ORAL | 6 refills | Status: DC

## 2021-09-17 NOTE — Telephone Encounter (Signed)
   Telephone encounter was:  Unsuccessful.  09/17/2021 Name: Christine Mcdowell MRN: [id number] DOB: 1971-04-02  Unsuccessful outbound call made today to assist with:  Food Insecurity and Home Modifications  Outreach Attempt:  1st Attempt  A HIPAA compliant voice message was left requesting a return call.  Instructed patient to call back at   Instructed patient to call back at [phone]  at their earliest convenience. .  [place], Care Management  [phone] [address] , [place] [address] Email : [email or web address]

## 2021-09-17 NOTE — Telephone Encounter (Signed)
CSW called pt to check in regarding housing concerns- unable to reach left message requesting return call  [name], [facility] Desk#: [phone] Cell#: [phone]

## 2021-09-17 NOTE — Chronic Care Management (AMB) (Signed)
Chronic Care Management Pharmacy Assistant   Name: Christine Mcdowell  MRN: [id number] DOB: 1971-03-15   Reason for Encounter: Medication Review/ Medication Coordination   Recent office visits:  07-25-2021 [name], [place]. A1C= 6.7. Shingrix given.  09-06-2021 Kellie Simmering, LPN. Annual wellness completed. Referral placed to community care coordination.  09-10-2021 Little, [name], RN (CCM)  Recent consult visits:  07-16-2021 Lorenza Cambridge, MD (Rheumatology). Unable to view encounter.  08-01-2021 Benjaman Lobe, PA (Orthopaedics). Unable to view encounter.  08-02-2021 Benjaman Lobe, PA (Orthopaedics). Unable to view encounter.  08-10-2021 Benjaman Lobe, PA (Orthopaedics). Unable to view encounter.  08-10-2021 Mechele Collin (Podiatry). Unable to view encounter.  08-13-2021 Short, Laurie Panda, RN ([facility]). ICM monitoring.  08-29-2021 Simone Curia, RN ([facility]). Pacemaker check.  09-05-2021 Shirley Friar, PA-C [facility] heart[facility]). Orders placed for pacemaker check.  09-06-2021 Benjaman Lobe, PA (Orthopaedics). Unable to view encounter.  09-07-2021 Short, Laurie Panda, RN ([facility]). ICM monitoring.  09-10-2021 Rafael Bihari, FNP (Heart/Vascular). START Amlodipine 5 mg daily.  09-12-2021 Louann Liv (Orthopedics). Unable to view encounter  09-13-2021 Medda, Alcus Dad, DPM (Podiatry). PR fusion of big toe.  Hospital visits:  None in previous 6 months  Medications: Outpatient Encounter Medications as of 09/17/2021  Medication Sig   albuterol (ACCUNEB) 1.25 MG/3ML nebulizer solution Take 3 mLs (1.25 mg total) by nebulization every 6 (six) hours as needed for wheezing. (Patient taking differently: Take 1 ampule by nebulization 3 (three) times daily as needed for wheezing.)   amLODipine (NORVASC) 5 MG tablet Take 1 tablet (5 mg total) by mouth daily.   aspirin 81 MG chewable tablet Chew  162 mg by mouth daily. Taking 2 tablets   atorvastatin (LIPITOR) 10 MG tablet Take 1 tablet (10 mg total) by mouth daily.   budesonide-formoterol (SYMBICORT) 80-4.5 MCG/ACT inhaler Inhale 2 puffs into the lungs in the morning and at bedtime.   carvedilol (COREG) 25 MG tablet TAKE ONE TABLET BY MOUTH EVERY MORNING and TAKE ONE TABLET BY MOUTH EVERY EVENING   Continuous Blood Gluc Receiver (DEXCOM G6 RECEIVER) DEVI 1 Device by Does not apply route 3 (three) times daily before meals.   Continuous Blood Gluc Sensor (DEXCOM G6 SENSOR) MISC Inject 1 each into the skin 3 (three) times daily.   Continuous Blood Gluc Transmit (DEXCOM G6 TRANSMITTER) MISC 1 Device by Does not apply route 3 (three) times daily before meals.   ENTRESTO 97-103 MG TAKE ONE TABLET BY MOUTH TWICE DAILY   folic acid (FOLVITE) 1 MG tablet TAKE 1 TABLET BY MOUTH EVERY MORNING   Glucagon (GVOKE HYPOPEN 2-PACK) 1 MG/0.2ML SOAJ Inject 1 mg into the skin as needed.   hydrALAZINE (APRESOLINE) 25 MG tablet Take 1 tablet (25 mg total) by mouth 3 (three) times daily. Please schedule appointment for future refills. Thank you (Patient taking differently: Take 100 mg by mouth 3 (three) times daily. Please schedule appointment for future refills. Thank you)   hydroxychloroquine (PLAQUENIL) 200 MG tablet Take 1 tablet (200 mg total) by mouth 2 (two) times daily.   insulin lispro (HUMALOG KWIKPEN) 100 UNIT/ML KwikPen Use according to sliding scale   Insulin Pen Needle (NOVOFINE PLUS PEN NEEDLE) 32G X 4 MM MISC Use with insulin pens dx code e11.65   Magnesium 400 MG TABS Take 1 tablet by mouth daily.    meloxicam (MOBIC) 15 MG tablet Take 15 mg by mouth daily.    metFORMIN (GLUCOPHAGE) 500  MG tablet TAKE ONE TABLET BY MOUTH TWICE DAILY   methotrexate (RHEUMATREX) 2.5 MG tablet Take 20 mg by mouth every Friday.    metolazone (ZAROXOLYN) 2.5 MG tablet TAKE 1 TABLET BY MOUTH AS NEEDED FOR WEIGHT GAIN OF 5 POUNDS WITHIN 3 DAYS AS DIRECTED needs appt  for further refills   montelukast (SINGULAIR) 10 MG tablet TAKE ONE TABLET BY MOUTH EVERY MORNING   Multiple Vitamin (MULTIVITAMIN WITH MINERALS) TABS Take 1 tablet by mouth every morning.    ondansetron (ZOFRAN-ODT) 4 MG disintegrating tablet DISSOLVE ONE TABLET UNDER THE TONGUE EVERY TWELVE HOURS AS NEEDED   PARoxetine (PAXIL) 10 MG tablet TAKE ONE TABLET BY MOUTH EVERY MORNING   potassium chloride SA (KLOR-CON) 10 MEQ tablet Take 3 tablets (30 mEq total) by mouth 2 (two) times daily.   predniSONE (DELTASONE) 20 MG tablet Take 20 mg by mouth daily with breakfast.   PROAIR HFA 108 (90 Base) MCG/ACT inhaler INHALE 2 PUFFS BY MOUTH EVERY DAY AS NEEDED FOR SHORTNESS OF BREATH   spironolactone (ALDACTONE) 25 MG tablet TAKE ONE TABLET BY MOUTH EVERY MORNING and TAKE ONE TABLET BY MOUTH EVERY EVENING last refill without office visit please call [phone]   torsemide (DEMADEX) 20 MG tablet Take 2 tablets (40 mg total) by mouth daily.   traZODone (DESYREL) 50 MG tablet Take 50 mg by mouth at bedtime as needed.   Vitamin D, Ergocalciferol, (DRISDOL) 1.25 MG (50000 UNIT) CAPS capsule TAKE ONE CAPSULE BY MOUTH ONCE WEEKLY ON TUESDAY and TAKE ONE CAPSULE BY MOUTH ONCE WEEKLY ON FRIDAY   No facility-administered encounter medications on file as of 09/17/2021.   Reviewed chart for medication changes ahead of medication coordination call.  No OVs, Consults, or hospital visits since last care coordination call/Pharmacist visit. (If appropriate, list visit date, provider name)  No medication changes indicated OR if recent visit, treatment plan here.  BP Readings from Last 3 Encounters:  09/10/21 138/90  09/05/21 136/80  08/30/21 (!) 168/100    Lab Results  Component Value Date   HGBA1C 6.7 (H) 07/25/2021     Patient obtains medications through Vials  30 Days   Last adherence delivery included:  Montelukast 10 mg- one tablet daily Trazodone 50 mg- 1 tablet at bedtime as needed for sleep (patient  restarted medication) Metformin 500 mg- one tablet twice a day Paroxetine 10 mg- 1 tablet daily Entresto 97 mg/103 mg- one tablet twice a day Atorvastatin 10 mg- one tablet daily Folic Acid 1 mg- 1 tablet daily Insulin Lispro TruePlus Pen Needles 32 G- use as directed Hydroxychloroquine 200 mg- one tablet by mouth twice daily Torsemide 20 mg- one tablet Monday, Wednesday and Friday Potassium CL ER 20 Meq- 2 tablets by mouth daily  Patient declined (meds) last month: None  Explanation of abundance on hand (ie #30 due to overlapping fills or previous adherence issues etc)  Patient is due for next adherence delivery on: 09-25-2021  Called patient and reviewed medications and coordinated delivery.  This delivery to include: Diclofenac 1% gel- apply small amount to joints twice daily as needed Montelukast 10 mg- one tablet daily Vitamin D 50,000 units- one capsules two times a week, Tuesday and Friday Trazodone 50 mg- 1 tablet at bedtime as needed for sleep (patient restarted medication) Paroxetine 10 mg- 1 tablet daily Carvedilol 25 mg- One tablet twice a day Hydralazine 25 mg- 3 tablets three times daily Entresto 97 mg/103 mg- one tablet twice a day Atorvastatin 10 mg- one tablet daily  Metformin 500 mg- one tablet twice a day Spironolactone 25 mg- one tablet twice a day  Insulin Lispro Folic Acid 1 mg- 1 tablet daily TruePlus Pen Needles 32 G- use as directed Hydroxychloroquine 200 mg- one tablet by mouth twice daily Budes/formot Aer 80-4.5 mcg- inhale 2 puffs into lungs in the morning and bedtime. Potassium CL ER 20 Meq- 2 tablets by mouth daily Torsemide 20 mg- one tablet Monday, Wednesday and Friday Methotrexate 2.5 mg- 20 mg every Friday  Patient will need a short fill of (med), prior to adherence delivery. (To align with sync date or if PRN med)   Coordinated acute fill for (med) to be delivered (date).  Patient declined the following medications (meds) due to  (reason)  Patient needs refills for: Diclofenac 1% gel Vitamin D Carvedilol 25 mg- bensimhon Hydralazine 25 mg- gregg taylor Spironolactone 25 mg- bensimon Budes/formot Aer 80-4.5 mcg Methotrexate 2.5 mg  Unable to confirm delivery date of 10-042022   09-17-2021: 1st attempt left VM refill request sent.  Care Gaps: Colonoscopy overdue Covid booster overdue Last AWV 09-06-2021 A1C 07-25-2021 6.7  Star Rating Drugs: Atorvastatin 10 mg- Last filled 08-28-2021 [facility] Entresto 97/103 mg- Last filled 08-28-2021 [facility] Metformin 500 mg- Last filled 08-28-2021 [facility]  [facility] Clinical Pharmacist Assistant [phone]

## 2021-09-19 ENCOUNTER — Other Ambulatory Visit: Payer: Self-pay

## 2021-09-19 ENCOUNTER — Other Ambulatory Visit: Payer: Self-pay | Admitting: Nurse Practitioner

## 2021-09-19 ENCOUNTER — Other Ambulatory Visit: Payer: Self-pay | Admitting: Internal Medicine

## 2021-09-19 DIAGNOSIS — J209 Acute bronchitis, unspecified: Secondary | ICD-10-CM

## 2021-09-19 MED ORDER — BUDESONIDE-FORMOTEROL FUMARATE 80-4.5 MCG/ACT IN AERO
2.0000 | INHALATION_SPRAY | Freq: Two times a day (BID) | RESPIRATORY_TRACT | 1 refills | Status: DC
Start: 2021-09-19 — End: 2022-02-04

## 2021-09-19 MED ORDER — VITAMIN D (ERGOCALCIFEROL) 1.25 MG (50000 UNIT) PO CAPS: ORAL_CAPSULE | ORAL | 0 refills | Status: DC

## 2021-09-21 DIAGNOSIS — E1169 Type 2 diabetes mellitus with other specified complication: Secondary | ICD-10-CM

## 2021-09-21 DIAGNOSIS — J4520 Mild intermittent asthma, uncomplicated: Secondary | ICD-10-CM | POA: Diagnosis not present

## 2021-09-21 DIAGNOSIS — I5022 Chronic systolic (congestive) heart failure: Secondary | ICD-10-CM | POA: Diagnosis not present

## 2021-09-21 DIAGNOSIS — M069 Rheumatoid arthritis, unspecified: Secondary | ICD-10-CM

## 2021-09-21 DIAGNOSIS — I10 Essential (primary) hypertension: Secondary | ICD-10-CM

## 2021-09-25 ENCOUNTER — Encounter: Payer: Self-pay | Admitting: Nurse Practitioner

## 2021-09-26 DIAGNOSIS — M2012 Hallux valgus (acquired), left foot: Secondary | ICD-10-CM | POA: Diagnosis not present

## 2021-09-28 ENCOUNTER — Telehealth: Payer: Self-pay | Admitting: *Deleted

## 2021-09-28 NOTE — Telephone Encounter (Signed)
   Telephone encounter was:  Successful.  09/28/2021 Name: Christine Mcdowell MRN: [id number] DOB: 1971-12-12  Christine Mcdowell is a 50 y.o. year old female who is a primary care patient of [name], [place] . The community resource team was consulted for assistance with patient needs information from [place] [employer] about a housing voucher she wants to transfer ,also advised to go to [place] for outstanding water bill  Care guide performed the following interventions: Patient provided with information about care guide support team and interviewed to confirm resource needs Follow up call placed to community resources to determine status of patients referral.  Follow Up Plan:  Care guide will follow up with patient by phone over the next day  [place], Care Management  [phone] [address] , [place] [address] Email : [email or web address]

## 2021-10-03 ENCOUNTER — Telehealth: Payer: Self-pay | Admitting: *Deleted

## 2021-10-03 NOTE — Telephone Encounter (Signed)
   Telephone encounter was:  Successful.  10/03/2021 Name: Christine Mcdowell MRN: [id number] DOB: July 24, 1971  Christine Mcdowell is a 50 y.o. year old female who is a primary care patient of [name], [place] . The community resource team was consulted for assistance with utilities  Care guide performed the following interventions: Patient provided with information about care guide support team and interviewed to confirm resource needs Follow up call placed to the patient to discuss status of referral.  Follow Up Plan:  No further follow up planned at this time. The patient has been provided with needed resources.  [place], Care Management  [phone] [address] , [place] [address] Email : [email or web address]

## 2021-10-06 ENCOUNTER — Emergency Department (HOSPITAL_BASED_OUTPATIENT_CLINIC_OR_DEPARTMENT_OTHER)
Admission: EM | Admit: 2021-10-06 | Discharge: 2021-10-07 | Disposition: A | Discharge status: Home / Self Care | Payer: Medicare Other | Attending: Emergency Medicine | Admitting: Emergency Medicine

## 2021-10-06 ENCOUNTER — Emergency Department (HOSPITAL_BASED_OUTPATIENT_CLINIC_OR_DEPARTMENT_OTHER): Payer: Medicare Other

## 2021-10-06 ENCOUNTER — Other Ambulatory Visit: Payer: Self-pay

## 2021-10-06 DIAGNOSIS — Z20822 Contact with and (suspected) exposure to covid-19: Secondary | ICD-10-CM | POA: Insufficient documentation

## 2021-10-06 DIAGNOSIS — Z794 Long term (current) use of insulin: Secondary | ICD-10-CM | POA: Diagnosis not present

## 2021-10-06 DIAGNOSIS — Z91040 Latex allergy status: Secondary | ICD-10-CM | POA: Insufficient documentation

## 2021-10-06 DIAGNOSIS — J209 Acute bronchitis, unspecified: Secondary | ICD-10-CM | POA: Diagnosis not present

## 2021-10-06 DIAGNOSIS — I110 Hypertensive heart disease with heart failure: Secondary | ICD-10-CM | POA: Diagnosis not present

## 2021-10-06 DIAGNOSIS — J4541 Moderate persistent asthma with (acute) exacerbation: Secondary | ICD-10-CM | POA: Diagnosis not present

## 2021-10-06 DIAGNOSIS — R079 Chest pain, unspecified: Secondary | ICD-10-CM | POA: Diagnosis not present

## 2021-10-06 DIAGNOSIS — E119 Type 2 diabetes mellitus without complications: Secondary | ICD-10-CM | POA: Diagnosis not present

## 2021-10-06 DIAGNOSIS — Z7982 Long term (current) use of aspirin: Secondary | ICD-10-CM | POA: Diagnosis not present

## 2021-10-06 DIAGNOSIS — Z79899 Other long term (current) drug therapy: Secondary | ICD-10-CM | POA: Diagnosis not present

## 2021-10-06 DIAGNOSIS — R0602 Shortness of breath: Secondary | ICD-10-CM | POA: Diagnosis not present

## 2021-10-06 DIAGNOSIS — R600 Localized edema: Secondary | ICD-10-CM | POA: Insufficient documentation

## 2021-10-06 DIAGNOSIS — Z95810 Presence of automatic (implantable) cardiac defibrillator: Secondary | ICD-10-CM | POA: Diagnosis not present

## 2021-10-06 DIAGNOSIS — J40 Bronchitis, not specified as acute or chronic: Secondary | ICD-10-CM

## 2021-10-06 DIAGNOSIS — Z8616 Personal history of COVID-19: Secondary | ICD-10-CM | POA: Insufficient documentation

## 2021-10-06 DIAGNOSIS — R000 Tachycardia, unspecified: Secondary | ICD-10-CM | POA: Diagnosis not present

## 2021-10-06 DIAGNOSIS — M7989 Other specified soft tissue disorders: Secondary | ICD-10-CM | POA: Diagnosis not present

## 2021-10-06 DIAGNOSIS — I5022 Chronic systolic (congestive) heart failure: Secondary | ICD-10-CM | POA: Diagnosis not present

## 2021-10-06 DIAGNOSIS — D649 Anemia, unspecified: Secondary | ICD-10-CM | POA: Diagnosis not present

## 2021-10-06 LAB — CBC
HCT: 32.2 % — ABNORMAL LOW (ref 36.0–46.0)
Hemoglobin: 10.5 g/dL — ABNORMAL LOW (ref 12.0–15.0)
MCH: 27.3 pg (ref 26.0–34.0)
MCHC: 32.6 g/dL (ref 30.0–36.0)
MCV: 83.9 fL (ref 80.0–100.0)
Platelets: 201 10*3/uL (ref 150–400)
RBC: 3.84 MIL/uL — ABNORMAL LOW (ref 3.87–5.11)
RDW: 14.5 % (ref 11.5–15.5)
WBC: 4.7 10*3/uL (ref 4.0–10.5)
nRBC: 0 % (ref 0.0–0.2)

## 2021-10-06 LAB — BASIC METABOLIC PANEL
Anion gap: 10 (ref 5–15)
BUN: 14 mg/dL (ref 6–20)
CO2: 24 mmol/L (ref 22–32)
Calcium: 9.4 mg/dL (ref 8.9–10.3)
Chloride: 105 mmol/L (ref 98–111)
Creatinine, Ser: 1.1 mg/dL — ABNORMAL HIGH (ref 0.44–1.00)
GFR, Estimated: >60 mL/min (ref >60)
Glucose, Bld: 149 mg/dL — ABNORMAL HIGH (ref 70–99)
Potassium: 2.7 mmol/L — CL (ref 3.5–5.1)
Sodium: 139 mmol/L (ref 135–145)

## 2021-10-06 LAB — RESP PANEL BY RT-PCR (FLU A&B, COVID) ARPGX2
Influenza A by PCR: NEGATIVE
Influenza B by PCR: NEGATIVE
SARS Coronavirus 2 by RT PCR: NEGATIVE

## 2021-10-06 LAB — BRAIN NATRIURETIC PEPTIDE: B Natriuretic Peptide: 40.3 pg/mL (ref 0.0–100.0)

## 2021-10-06 LAB — TROPONIN I (HIGH SENSITIVITY)
Troponin I (High Sensitivity): 3 ng/L (ref <18)
Troponin I (High Sensitivity): <2 ng/L (ref <18)

## 2021-10-06 LAB — D-DIMER, QUANTITATIVE: D-Dimer, Quant: 2.41 ug/mL-FEU — ABNORMAL HIGH (ref 0.00–0.50)

## 2021-10-06 MED ORDER — ALBUTEROL SULFATE (2.5 MG/3ML) 0.083% IN NEBU
2.5000 mg | INHALATION_SOLUTION | Freq: Once | RESPIRATORY_TRACT | Status: AC
  Administered 2021-10-06: 2.5 mg via RESPIRATORY_TRACT
  Filled 2021-10-06: qty 3

## 2021-10-06 MED ORDER — SODIUM CHLORIDE 0.9 % IV SOLN
INTRAVENOUS | Status: DC | PRN
  Administered 2021-10-06: 250 mL via INTRAVENOUS

## 2021-10-06 MED ORDER — IPRATROPIUM BROMIDE 0.02 % IN SOLN: 0.5000 mg | Freq: Once | RESPIRATORY_TRACT | Status: DC

## 2021-10-06 MED ORDER — ALBUTEROL SULFATE (2.5 MG/3ML) 0.083% IN NEBU: 5.0000 mg | INHALATION_SOLUTION | Freq: Once | RESPIRATORY_TRACT | Status: DC

## 2021-10-06 MED ORDER — MAGNESIUM SULFATE 2 GM/50ML IV SOLN
2.0000 g | Freq: Once | INTRAVENOUS | Status: AC
  Administered 2021-10-06: 2 g via INTRAVENOUS
  Filled 2021-10-06: qty 50

## 2021-10-06 MED ORDER — IPRATROPIUM-ALBUTEROL 0.5-2.5 (3) MG/3ML IN SOLN
3.0000 mL | Freq: Once | RESPIRATORY_TRACT | Status: AC
  Administered 2021-10-06: 3 mL via RESPIRATORY_TRACT
  Filled 2021-10-06: qty 3

## 2021-10-06 MED ORDER — ACETAMINOPHEN 500 MG PO TABS
1000.0000 mg | ORAL_TABLET | Freq: Once | ORAL | Status: AC
  Administered 2021-10-06: 1000 mg via ORAL
  Filled 2021-10-06: qty 2

## 2021-10-06 MED ORDER — IOHEXOL 350 MG/ML SOLN
100.0000 mL | Freq: Once | INTRAVENOUS | Status: AC | PRN
  Administered 2021-10-06: 100 mL via INTRAVENOUS

## 2021-10-06 MED ORDER — POTASSIUM CHLORIDE 10 MEQ/100ML IV SOLN
10.0000 meq | INTRAVENOUS | Status: AC
  Administered 2021-10-06 (×2): 10 meq via INTRAVENOUS
  Filled 2021-10-06 (×2): qty 100

## 2021-10-06 MED ORDER — METHYLPREDNISOLONE SODIUM SUCC 125 MG IJ SOLR
125.0000 mg | Freq: Once | INTRAMUSCULAR | Status: AC
  Administered 2021-10-06: 125 mg via INTRAVENOUS
  Filled 2021-10-06: qty 2

## 2021-10-06 MED ORDER — SODIUM CHLORIDE 0.9 % IV SOLN: Freq: Once | INTRAVENOUS | Status: AC

## 2021-10-06 NOTE — ED Notes (Signed)
Dr. Maryan Rued notified of pt's potassium 2.7.

## 2021-10-06 NOTE — ED Provider Notes (Signed)
[facility] Provider Note   CSN: [id number] Arrival date & time: 10/06/21  1824     History Chief Complaint  Patient presents with   Chest Pain   Shortness of Breath    Christine Mcdowell is a 50 y.o. female.   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  Over the last week she has had a nonproductive cough that just worsened today making it very hard for her to catch her breath.  She was wheezing at home and things were not improved with her albuterol.  She has not had any fever, nasal congestion or chest pain.  She does report having a bunion surgery 1 month ago but feels that everything has been healing well however she has had some swelling in that left leg.  She does take aspirin but no stronger anticoagulants.  She has not noticed any swelling in her right leg and her weights have been stable.  She has been compliant with her medications.  She is not on any steroids at this time.  The history is provided by the patient.  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   Discoid lupus    Fibromyalgia    History of blood transfusion "several"   "related to anemia; had some w/hysterectomy also"   Hx of cardiovascular stress test    ETT-Myoview (9/15):  No ischemia, EF 52%; NORMAL   Hx of echocardiogram    Echo (9/15):  EF 50-55%, ant HK, Gr 1 DD, mild MR, mild LAE, no effusion    Hypertension    Iron deficiency anemia    h/o iron transfusions   Lupus (systemic lupus erythematosus) ([place])    Migraine    "a few/year" (07/03/2016)   Pneumonia 12/2015   RA (rheumatoid arthritis) ([place])    "all over" (07/03/2016)   Sickle cell trait ([place])    Stroke ([place]) 2014 X 1; 2015 X 2; 2016 X 1;    "right side of face more relaxed than the other; rare speech hesitation" (07/03/2016)   Vaginal Pap smear, abnormal    ASCUS; HPV    Patient Active Problem List   Diagnosis Date Noted   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   History of COVID-19 04/12/2020   COVID-19 virus infection 03/07/2020   Lower abdominal pain 95/63/8756   Chronic systolic (congestive) heart failure ([place]) 01/28/2018   Cough 01/19/2018   Pulmonary infiltrates on CXR 01/19/2018   Migraines 10/08/2017   Sleep apnea with use of continuous positive airway pressure (CPAP) 09/24/2017   Rheumatoid arthritis ([place]) 10/09/2016   Asthma 10/09/2016   Chronic pain 10/09/2016   Heart failure ([place]) 10/09/2016   Congestive heart failure ([place])    Lupus (systemic lupus erythematosus) ([place])    Diabetes mellitus with complication ([place])    Anxiety state    GERD (gastroesophageal reflux disease) 12/28/2015   Essential hypertension 12/26/2015  Type 2 diabetes mellitus ([place]) 94/76/5465   Chronic systolic heart failure ([place]) 08/18/2014   Cardiomyopathy, 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   Shortness of breath 01/26/2014   SLE (systemic lupus erythematosus) ([place]) 01/26/2014    Past Surgical History:  Procedure Laterality Date   ABDOMINAL HYSTERECTOMY  2009   ABDOMINAL WOUND DEHISCENCE  2009   BUNIONECTOMY Left 06/15/2020   CARDIAC CATHETERIZATION N/A 10/11/2016   Procedure: Right/Left Heart Cath and Coronary Angiography;  Surgeon: Jolaine Artist, MD;  Location: [facility];  Service: Cardiovascular;  Laterality: N/A;   DILATION AND CURETTAGE OF UTERUS  1991   HEMATOMA EVACUATION  2009    abdomen   ICD IMPLANT  01/28/2018   ICD IMPLANT N/A 01/28/2018   Procedure: ICD IMPLANT;  Surgeon: Evans Lance, MD;  Location: [facility];  Service: Cardiovascular;  Laterality: N/A;   INCISE AND DRAIN ABCESS  2009 X 2   "abdomen after hysterectomy"   KNEE ARTHROSCOPY Right 1997   KNEE SURGERY Right    TUBAL LIGATION  1996     OB History     Gravida  5   Para  4   Term  3   Preterm  1   AB  1   Living  4      SAB  1   IAB      Ectopic      Multiple      Live Births  4           Family History  Problem Relation Age of Onset   Arthritis Mother    Heart murmur Mother    Drug abuse Mother    Allergies Mother    Heart attack Father    Cushing syndrome Father    Depression Father    Allergies Father    Dementia Paternal Grandmother    Cancer Paternal Grandfather    Diabetes Maternal Grandmother    Hypertension Maternal Grandmother    Asthma Maternal Grandmother    Heart attack Maternal Grandfather    Breast cancer Neg Hx     Social History   Tobacco Use   Smoking status: Never   Smokeless tobacco: Never  Vaping Use   Vaping Use: Never used  Substance Use Topics   Alcohol use: No   Drug use: No    Home Medications Prior to Admission medications   Medication Sig Start Date End Date Taking? Authorizing Provider  doxycycline (VIBRAMYCIN) 100 MG capsule Take 1 capsule (100 mg total) by mouth 2 (two) times daily. 10/07/21  Yes Jolanta Cabeza, Loree Fee, MD  predniSONE (DELTASONE) 20 MG tablet Take 2 tablets (40 mg total) by mouth daily. Take the 40mg  for 5 days and then take 20 mg (1 tab) po for 3 days and then 10mg (1/2 tab) for 4 days 10/07/21  Yes Blanchie Dessert, MD  albuterol (ACCUNEB) 1.25 MG/3ML nebulizer solution Take 3 mLs (1.25 mg total) by nebulization every 6 (six) hours as needed for wheezing. Patient taking differently: Take 1 ampule by nebulization 3 (three) times daily as needed for wheezing. 03/07/20   [name], FNP   amLODipine (NORVASC) 5 MG tablet Take 1 tablet (5 mg total) by mouth daily. 09/10/21 12/09/21  Rafael Bihari, FNP  aspirin 81 MG chewable tablet Chew 162 mg by mouth daily. Taking 2 tablets    [provider]  atorvastatin (LIPITOR) 10 MG tablet Take 1 tablet (10 mg total) by mouth daily. 04/11/21   [name], MD  budesonide-formoterol (SYMBICORT) 80-4.5 MCG/ACT inhaler Inhale 2 puffs into the lungs 2 (two) times daily. in the morning and at bedtime. 09/19/21   [name], FNP  carvedilol (COREG) 25 MG tablet TAKE ONE TABLET BY MOUTH EVERY MORNING and TAKE ONE TABLET BY MOUTH EVERY EVENING 09/17/21   Bensimhon, Shaune Pascal, MD  Continuous Blood Gluc Receiver (DEXCOM G6 RECEIVER) DEVI 1 Device by Does not apply route 3 (three) times daily before meals. 04/12/20   [name], FNP  Continuous Blood Gluc Sensor (DEXCOM G6 SENSOR) MISC Inject 1 each into the skin 3 (three) times daily. 04/12/20   [name], FNP  Continuous Blood Gluc Transmit (DEXCOM G6 TRANSMITTER) MISC 1 Device by Does not apply route 3 (three) times daily before meals. 04/12/20   [name], FNP  ENTRESTO 97-103 MG TAKE ONE TABLET BY MOUTH TWICE DAILY 08/28/21   Bensimhon, Shaune Pascal, MD  folic acid (FOLVITE) 1 MG tablet TAKE 1 TABLET BY MOUTH EVERY MORNING 01/30/17   Bensimhon, Shaune Pascal, MD  Glucagon (GVOKE HYPOPEN 2-PACK) 1 MG/0.2ML SOAJ Inject 1 mg into the skin as needed. 01/23/21   [name], FNP  hydrALAZINE (APRESOLINE) 25 MG tablet Take 1 tablet (25 mg total) by mouth 3 (three) times daily. Please schedule appointment for future refills. Thank you 09/17/21   Evans Lance, MD  hydroxychloroquine (PLAQUENIL) 200 MG tablet Take 1 tablet (200 mg total) by mouth 2 (two) times daily. 02/02/19   Bensimhon, Shaune Pascal, MD  insulin lispro (HUMALOG KWIKPEN) 100 UNIT/ML KwikPen Use according to sliding scale 12/19/20   [name], FNP  Insulin Pen Needle (NOVOFINE PLUS PEN NEEDLE) 32G X 4 MM MISC Use with insulin pens  dx code e11.65 11/09/20   [name], FNP  Magnesium 400 MG TABS Take 1 tablet by mouth daily.     [provider]  meloxicam (MOBIC) 15 MG tablet Take 15 mg by mouth daily.     [provider]  metFORMIN (GLUCOPHAGE) 500 MG tablet TAKE ONE TABLET BY MOUTH TWICE DAILY 04/04/21   [name], FNP  methotrexate (RHEUMATREX) 2.5 MG tablet Take 20 mg by mouth every Friday.  02/06/15   [provider]  metolazone (ZAROXOLYN) 2.5 MG tablet TAKE 1 TABLET BY MOUTH AS NEEDED FOR WEIGHT GAIN OF 5 POUNDS WITHIN 3 DAYS AS DIRECTED needs appt for further refills 03/16/21   Bensimhon, Shaune Pascal, MD  montelukast (SINGULAIR) 10 MG tablet TAKE ONE TABLET BY MOUTH EVERY MORNING 04/04/21   [name], FNP  Multiple Vitamin (MULTIVITAMIN WITH MINERALS) TABS Take 1 tablet by mouth every morning.     [provider]  ondansetron (ZOFRAN-ODT) 4 MG disintegrating tablet DISSOLVE ONE TABLET UNDER THE TONGUE EVERY TWELVE HOURS AS NEEDED 04/11/21   [name], MD  PARoxetine (PAXIL) 10 MG tablet TAKE ONE TABLET BY MOUTH EVERY MORNING 07/25/21   [name], FNP  potassium chloride SA (KLOR-CON) 10 MEQ tablet Take 3 tablets (30 mEq total) by mouth 2 (two) times daily. 08/30/21   [place], Maricela Bo, FNP  predniSONE (DELTASONE) 20 MG tablet Take 20 mg by mouth daily with breakfast.    [provider]  PROAIR HFA 108 (90 Base) MCG/ACT inhaler INHALE 2 PUFFS BY MOUTH EVERY DAY AS NEEDED FOR SHORTNESS OF BREATH 03/07/20   [name], FNP  spironolactone (ALDACTONE) 25 MG tablet TAKE ONE TABLET BY MOUTH EVERY MORNING and TAKE ONE TABLET BY MOUTH EVERY EVENING 09/17/21   Bensimhon, Shaune Pascal, MD  torsemide (  DEMADEX) 20 MG tablet Take 2 tablets (40 mg total) by mouth daily. 08/30/21   Rafael Bihari, FNP  traZODone (DESYREL) 50 MG tablet Take 50 mg by mouth at bedtime as needed. 07/05/21   [provider]  Vitamin D, Ergocalciferol, (DRISDOL) 1.25 MG (50000 UNIT) CAPS capsule  TAKE ONE CAPSULE BY MOUTH twice A WEEK ON ONCE WEEKLY ON TUESDAY AND ON ONCE WEEKLY ON FRIDAY 09/19/21   [name], FNP    Allergies    Hydrocodone-acetaminophen, Hydromorphone, Iodinated diagnostic agents, Other, Erythromycin, Latex, Rinvoq [upadacitinib], Farxiga [dapagliflozin], Tape, and Mircette [desogestrel-ethinyl estradiol]  Review of Systems   Review of Systems  Respiratory:  Positive for shortness of breath.   Cardiovascular:  Positive for chest pain.  All other systems reviewed and are negative.  Physical Exam Updated Vital Signs BP 116/72   Pulse (!) 106   Temp 98.7 F (37.1 C)   Resp 18   Ht 5\' 6"  (1.676 m)   Wt 87.1 kg   SpO2 96%   BMI 30.99 kg/m   Physical Exam Vitals and nursing note reviewed.  Constitutional:      General: She is not in acute distress.    Appearance: She is well-developed.  HENT:     Head: Normocephalic and atraumatic.     Nose: Nose normal.  Eyes:     Pupils: Pupils are equal, round, and reactive to light.  Cardiovascular:     Rate and Rhythm: Regular rhythm. Tachycardia present.     Pulses: Normal pulses.     Heart sounds: Normal heart sounds. No murmur heard.   No friction rub.  Pulmonary:     Effort: Pulmonary effort is normal. Tachypnea present.     Breath sounds: Decreased air movement present. Wheezing present. No rales.     Comments: Some upper airway noises and expiratory wheezing Abdominal:     General: Bowel sounds are normal. There is no distension.     Palpations: Abdomen is soft.     Tenderness: There is no abdominal tenderness. There is no guarding or rebound.  Musculoskeletal:        General: No tenderness. Normal range of motion.     Cervical back: Normal range of motion and neck supple.     Right lower leg: No edema.     Comments: Sutures in place on the left first metatarsal.  Wound appears to be healing well with no erythema or drainage.  Nonpitting edema noted to the left lower leg between the ankle and the  knee  Skin:    General: Skin is warm and dry.     Findings: No rash.  Neurological:     Mental Status: She is alert and oriented to person, place, and time. Mental status is at baseline.     Cranial Nerves: No cranial nerve deficit.  Psychiatric:        Mood and Affect: Mood normal.        Behavior: Behavior normal.    ED Results / Procedures / Treatments   Labs (all labs ordered are listed, but only abnormal results are displayed) Labs Reviewed  BASIC METABOLIC PANEL - Abnormal; Notable for the following components:      Result Value   Potassium 2.7 (*)    Glucose, Bld 149 (*)    Creatinine, Ser 1.10 (*)    All other components within normal limits  CBC - Abnormal; Notable for the following components:   RBC 3.84 (*)    Hemoglobin 10.5 (*)  HCT 32.2 (*)    All other components within normal limits  D-DIMER, QUANTITATIVE - Abnormal; Notable for the following components:   D-Dimer, Quant 2.41 (*)    All other components within normal limits  RESP PANEL BY RT-PCR (FLU A&B, COVID) ARPGX2  BRAIN NATRIURETIC PEPTIDE  TROPONIN I (HIGH SENSITIVITY)  TROPONIN I (HIGH SENSITIVITY)    EKG EKG Interpretation  Date/Time:  Saturday October 06 2021 18:30:02 EDT Ventricular Rate:  110 PR Interval:  188 QRS Duration: 84 QT Interval:  340 QTC Calculation: 460 R Axis:   -38 Text Interpretation: Sinus tachycardia Possible Left atrial enlargement Left axis deviation Minimal voltage criteria for LVH, may be normal variant ( Cornell product ) T wave abnormality, consider lateral ischemia No significant change since last tracing Confirmed by Blanchie Dessert [phone]) on 10/06/2021 6:37:54 PM  Radiology DG Chest 2 View  Result Date: 10/06/2021 CLINICAL DATA:  Chest pain for 1 hour EXAM: CHEST - 2 VIEW COMPARISON:  04/12/2021 FINDINGS: Cardiac shadow is enlarged but stable. Defibrillator is again noted. Lungs are well aerated bilaterally. Mild left basilar atelectasis/early infiltrate is  seen. No bony abnormality is noted. IMPRESSION: Increased airspace opacity in the left base consistent with atelectasis or early infiltrate. Electronically Signed   By: Inez Catalina M.D.   On: 10/06/2021 19:29   CT Angio Chest PE W and/or Wo Contrast  Result Date: 10/06/2021 CLINICAL DATA:  Shortness of breath, chest pain, recent foot surgery, evaluate for PE EXAM: CT ANGIOGRAPHY CHEST WITH CONTRAST TECHNIQUE: Multidetector CT imaging of the chest was performed using the standard protocol during bolus administration of intravenous contrast. Multiplanar CT image reconstructions and MIPs were obtained to evaluate the vascular anatomy. CONTRAST:  136mL OMNIPAQUE IOHEXOL 350 MG/ML SOLN COMPARISON:  None. FINDINGS: Cardiovascular: Satisfactory opacification of the bilateral pulmonary arteries to the segmental level. No evidence of pulmonary embolism. Although not tailored for evaluation of the thoracic aorta, there is no evidence thoracic aortic aneurysm or dissection. The heart is normal in size. No pericardial effusion. Left subclavian ICD. Mediastinum/Nodes: No suspicious mediastinal lymphadenopathy. Visualized thyroid is unremarkable. Lungs/Pleura: Lungs are essentially clear. No focal consolidation. No suspicious pulmonary nodules. No pleural effusion or pneumothorax. Upper Abdomen: Visualized upper abdomen is grossly unremarkable. Musculoskeletal: Visualized osseous structures are within normal limits. Review of the MIP images confirms the above findings. IMPRESSION: No evidence of pulmonary embolism. Negative CT chest. Electronically Signed   By: Julian Hy M.D.   On: 10/06/2021 22:51   US Venous Img Lower  Left (DVT Study)  Result Date: 10/06/2021 CLINICAL DATA:  Leg swelling following recent surgery, initial encounter EXAM: LEFT LOWER EXTREMITY VENOUS DOPPLER ULTRASOUND TECHNIQUE: Gray-scale sonography with graded compression, as well as color Doppler and duplex ultrasound were performed to  evaluate the lower extremity deep venous systems from the level of the common femoral vein and including the common femoral, femoral, profunda femoral, popliteal and calf veins including the posterior tibial, peroneal and gastrocnemius veins when visible. The superficial great saphenous vein was also interrogated. Spectral Doppler was utilized to evaluate flow at rest and with distal augmentation maneuvers in the common femoral, femoral and popliteal veins. COMPARISON:  None. FINDINGS: Contralateral Common Femoral Vein: Respiratory phasicity is normal and symmetric with the symptomatic side. No evidence of thrombus. Normal compressibility. Common Femoral Vein: No evidence of thrombus. Normal compressibility, respiratory phasicity and response to augmentation. Saphenofemoral Junction: No evidence of thrombus. Normal compressibility and flow on color Doppler imaging. Profunda Femoral Vein: No evidence of  thrombus. Normal compressibility and flow on color Doppler imaging. Femoral Vein: No evidence of thrombus. Normal compressibility, respiratory phasicity and response to augmentation. Popliteal Vein: No evidence of thrombus. Normal compressibility, respiratory phasicity and response to augmentation. Calf Veins: No evidence of thrombus. Normal compressibility and flow on color Doppler imaging. Superficial Great Saphenous Vein: No evidence of thrombus. Normal compressibility. Venous Reflux:  None. Other Findings:  None. IMPRESSION: No evidence of deep venous thrombosis. Electronically Signed   By: Inez Catalina M.D.   On: 10/06/2021 20:26    Procedures Procedures   Medications Ordered in ED Medications  0.9 %  sodium chloride infusion (250 mLs Intravenous New Bag/Given 10/06/21 1930)  doxycycline (VIBRA-TABS) tablet 100 mg (has no administration in time range)  Ipratropium-Albuterol (COMBIVENT) respimat 1 puff (has no administration in time range)  ipratropium-albuterol (DUONEB) 0.5-2.5 (3) MG/3ML nebulizer  solution 3 mL (3 mLs Nebulization Given 10/06/21 1854)  methylPREDNISolone sodium succinate (SOLU-MEDROL) 125 mg/2 mL injection 125 mg (125 mg Intravenous Given 10/06/21 1923)  magnesium sulfate IVPB 2 g 50 mL (0 g Intravenous Stopped 10/06/21 2031)  potassium chloride 10 mEq in 100 mL IVPB (0 mEq Intravenous Stopped 10/06/21 2300)  0.9 %  sodium chloride infusion ( Intravenous Rate/Dose Change 10/06/21 2005)  ipratropium-albuterol (DUONEB) 0.5-2.5 (3) MG/3ML nebulizer solution 3 mL (3 mLs Nebulization Given 10/06/21 2147)  albuterol (PROVENTIL) (2.5 MG/3ML) 0.083% nebulizer solution 2.5 mg (2.5 mg Nebulization Given 10/06/21 2147)  acetaminophen (TYLENOL) tablet 1,000 mg (1,000 mg Oral Given 10/06/21 2208)  iohexol (OMNIPAQUE) 350 MG/ML injection 100 mL (100 mLs Intravenous Contrast Given 10/06/21 2230)    ED Course  I have reviewed the triage vital signs and the nursing notes.  Pertinent labs & imaging results that were available during my care of the patient were reviewed by me and considered in my medical decision making (see chart for details).    MDM Rules/Calculators/A&P                           Patient is a 50 year old female with multiple medical problems including lupus, cardiomyopathy, asthma presenting today with sudden onset of shortness of breath.  She has had a cough for the last 1 week but symptoms significantly worsened today.  Albuterol has not been helping at home.  Patient is wheezing on exam and tachypneic but sats are normal.  Labs with a BMP with potassium of 2.7 most likely from albuterol use.  Creatinine is at baseline at 1.1.  CBC with baseline anemia and normal white count.  Patient had a D-dimer checked prior to being evaluated which is elevated at 2.4 today.  Chest x-ray with concern for increased airspace opacity in the left base concerning for atelectasis or pneumonia.  Given patient was having cough for the last week and symptoms are worsening concern for  possible pneumonia however with recent surgery 1 month ago and lower extremity swelling concern for PE.  Patient takes aspirin but no other anticoagulation.  We will do an ultrasound of her left lower extremity as there is swelling there.  Also concern for CHF exacerbation given patient's history of cardiomyopathy however patient's weight today is at her baseline.  She was given steroids, magnesium and albuterol and Atrovent.  12:17 AM Ultrasound was negative for DVT.  Looking back patient has received CTA about a year ago and did not have any allergic reaction.  It reported that she had shutdown of her kidneys however on further  review during that time she was also diuresed and had a cardiac catheterization and did have bump in her creatinine which did normalize.  She did fine with her CT 1 year ago without any complications.  Given elevated D-dimer, recent surgery, history of lupus we will scan to ensure no evidence of PE.  Discussed this with the patient and she is okay proceeding with the CT.  Patient's troponin within normal limits, BMP without acute findings, COVID and flu are negative.  CTA was negative for clot or pneumonia.  Will treat for bronchitis and lupus flare.  Patient after nebs he was breathing much more comfortably and O2 sats remained stable.  Will discharge home and have patient follow-up with her PCP  MDM   Amount and/or Complexity of Data Reviewed Clinical lab tests: ordered and reviewed Tests in the radiology section of CPT: ordered and reviewed Tests in the medicine section of CPT: reviewed and ordered Independent visualization of images, tracings, or specimens: yes    Final Clinical Impression(s) / ED Diagnoses Final diagnoses:  Moderate persistent asthma with acute exacerbation  Bronchitis    Rx / DC Orders ED Discharge Orders          Ordered    predniSONE (DELTASONE) 20 MG tablet  Daily        10/07/21 0007    doxycycline (VIBRAMYCIN) 100 MG capsule  2 times  daily        10/07/21 0007             Blanchie Dessert, MD 10/07/21 0018

## 2021-10-06 NOTE — ED Triage Notes (Signed)
Pt has hx of asthma & CHF. C/o SOB & chest pain starting today. Recent foot surgery 1 month ago. C/o congestion x 1 week.

## 2021-10-07 ENCOUNTER — Telehealth (HOSPITAL_BASED_OUTPATIENT_CLINIC_OR_DEPARTMENT_OTHER): Payer: Self-pay | Admitting: Emergency Medicine

## 2021-10-07 ENCOUNTER — Encounter: Payer: Self-pay | Admitting: Nurse Practitioner

## 2021-10-07 DIAGNOSIS — R059 Cough, unspecified: Secondary | ICD-10-CM

## 2021-10-07 MED ORDER — IPRATROPIUM-ALBUTEROL 20-100 MCG/ACT IN AERS
1.0000 | INHALATION_SPRAY | Freq: Four times a day (QID) | RESPIRATORY_TRACT | Status: DC
  Administered 2021-10-07: 1 via RESPIRATORY_TRACT
  Filled 2021-10-07: qty 4

## 2021-10-07 MED ORDER — PREDNISONE 20 MG PO TABS: 40.0000 mg | ORAL_TABLET | Freq: Every day | ORAL | 0 refills | Status: DC

## 2021-10-07 MED ORDER — ALBUTEROL SULFATE 1.25 MG/3ML IN NEBU: 1.0000 | INHALATION_SOLUTION | Freq: Four times a day (QID) | RESPIRATORY_TRACT | 0 refills | Status: DC | PRN

## 2021-10-07 MED ORDER — DOXYCYCLINE HYCLATE 100 MG PO CAPS: 100.0000 mg | ORAL_CAPSULE | Freq: Two times a day (BID) | ORAL | 0 refills | Status: DC

## 2021-10-07 MED ORDER — DOXYCYCLINE HYCLATE 100 MG PO TABS
100.0000 mg | ORAL_TABLET | Freq: Once | ORAL | Status: AC
  Administered 2021-10-07: 100 mg via ORAL
  Filled 2021-10-07: qty 1

## 2021-10-07 NOTE — Discharge Instructions (Addendum)
The antibiotic and prednisone was sent to your pharmacy.  You can start it tomorrow.  Use the inhaler you were given as needed.  Make sure you are taking your potassium as prescribed.  No evidence of blood clots today or pneumonia.  Kidney function looks good.  Everything with your heart was okay

## 2021-10-07 NOTE — Telephone Encounter (Signed)
Patient seen by Dr. Maryan Rued, states she was supposed to receive refill on neb solution.

## 2021-10-08 ENCOUNTER — Ambulatory Visit (INDEPENDENT_AMBULATORY_CARE_PROVIDER_SITE_OTHER): Payer: Medicare Other

## 2021-10-08 DIAGNOSIS — Z95810 Presence of automatic (implantable) cardiac defibrillator: Secondary | ICD-10-CM | POA: Diagnosis not present

## 2021-10-08 DIAGNOSIS — I5022 Chronic systolic (congestive) heart failure: Secondary | ICD-10-CM

## 2021-10-09 ENCOUNTER — Ambulatory Visit (HOSPITAL_BASED_OUTPATIENT_CLINIC_OR_DEPARTMENT_OTHER)
Admission: RE | Admit: 2021-10-09 | Discharge: 2021-10-09 | Disposition: A | Discharge status: Home / Self Care | Payer: Medicare Other | Source: Ambulatory Visit | Attending: Nurse Practitioner | Admitting: Nurse Practitioner

## 2021-10-09 ENCOUNTER — Ambulatory Visit (HOSPITAL_COMMUNITY)
Admission: RE | Admit: 2021-10-09 | Discharge: 2021-10-09 | Disposition: A | Discharge status: Home / Self Care | Payer: Medicare Other | Source: Ambulatory Visit | Attending: Internal Medicine | Admitting: Internal Medicine

## 2021-10-09 ENCOUNTER — Encounter (HOSPITAL_COMMUNITY): Payer: Self-pay | Admitting: Internal Medicine

## 2021-10-09 ENCOUNTER — Other Ambulatory Visit: Payer: Self-pay

## 2021-10-09 VITALS — BP 128/78 | HR 91 | Wt 184.8 lb

## 2021-10-09 DIAGNOSIS — Z7951 Long term (current) use of inhaled steroids: Secondary | ICD-10-CM | POA: Diagnosis not present

## 2021-10-09 DIAGNOSIS — Z79899 Other long term (current) drug therapy: Secondary | ICD-10-CM | POA: Insufficient documentation

## 2021-10-09 DIAGNOSIS — E876 Hypokalemia: Secondary | ICD-10-CM | POA: Insufficient documentation

## 2021-10-09 DIAGNOSIS — Z791 Long term (current) use of non-steroidal anti-inflammatories (NSAID): Secondary | ICD-10-CM | POA: Insufficient documentation

## 2021-10-09 DIAGNOSIS — Z794 Long term (current) use of insulin: Secondary | ICD-10-CM | POA: Insufficient documentation

## 2021-10-09 DIAGNOSIS — E119 Type 2 diabetes mellitus without complications: Secondary | ICD-10-CM | POA: Diagnosis not present

## 2021-10-09 DIAGNOSIS — I5022 Chronic systolic (congestive) heart failure: Secondary | ICD-10-CM | POA: Diagnosis not present

## 2021-10-09 DIAGNOSIS — Z7982 Long term (current) use of aspirin: Secondary | ICD-10-CM | POA: Diagnosis not present

## 2021-10-09 DIAGNOSIS — Z95810 Presence of automatic (implantable) cardiac defibrillator: Secondary | ICD-10-CM | POA: Insufficient documentation

## 2021-10-09 DIAGNOSIS — Z881 Allergy status to other antibiotic agents status: Secondary | ICD-10-CM | POA: Diagnosis not present

## 2021-10-09 DIAGNOSIS — M797 Fibromyalgia: Secondary | ICD-10-CM | POA: Insufficient documentation

## 2021-10-09 DIAGNOSIS — Z7952 Long term (current) use of systemic steroids: Secondary | ICD-10-CM | POA: Diagnosis not present

## 2021-10-09 DIAGNOSIS — Z8673 Personal history of transient ischemic attack (TIA), and cerebral infarction without residual deficits: Secondary | ICD-10-CM | POA: Diagnosis not present

## 2021-10-09 DIAGNOSIS — E785 Hyperlipidemia, unspecified: Secondary | ICD-10-CM | POA: Diagnosis not present

## 2021-10-09 DIAGNOSIS — Z8249 Family history of ischemic heart disease and other diseases of the circulatory system: Secondary | ICD-10-CM | POA: Diagnosis not present

## 2021-10-09 DIAGNOSIS — I081 Rheumatic disorders of both mitral and tricuspid valves: Secondary | ICD-10-CM | POA: Diagnosis not present

## 2021-10-09 DIAGNOSIS — Z7984 Long term (current) use of oral hypoglycemic drugs: Secondary | ICD-10-CM | POA: Diagnosis not present

## 2021-10-09 DIAGNOSIS — Z79631 Long term (current) use of antimetabolite agent: Secondary | ICD-10-CM | POA: Insufficient documentation

## 2021-10-09 DIAGNOSIS — M329 Systemic lupus erythematosus, unspecified: Secondary | ICD-10-CM | POA: Diagnosis not present

## 2021-10-09 DIAGNOSIS — I110 Hypertensive heart disease with heart failure: Secondary | ICD-10-CM | POA: Insufficient documentation

## 2021-10-09 DIAGNOSIS — Z888 Allergy status to other drugs, medicaments and biological substances status: Secondary | ICD-10-CM | POA: Diagnosis not present

## 2021-10-09 DIAGNOSIS — Z885 Allergy status to narcotic agent status: Secondary | ICD-10-CM | POA: Diagnosis not present

## 2021-10-09 DIAGNOSIS — I5020 Unspecified systolic (congestive) heart failure: Secondary | ICD-10-CM

## 2021-10-09 LAB — ECHOCARDIOGRAM COMPLETE
Calc EF: 44.6 %
S' Lateral: 4.2 cm
Single Plane A2C EF: 49.7 %
Single Plane A4C EF: 38.2 %

## 2021-10-09 LAB — BASIC METABOLIC PANEL
Anion gap: 7 (ref 5–15)
BUN: 17 mg/dL (ref 6–20)
CO2: 24 mmol/L (ref 22–32)
Calcium: 9.3 mg/dL (ref 8.9–10.3)
Chloride: 109 mmol/L (ref 98–111)
Creatinine, Ser: 0.85 mg/dL (ref 0.44–1.00)
GFR, Estimated: >60 mL/min (ref >60)
Glucose, Bld: 134 mg/dL — ABNORMAL HIGH (ref 70–99)
Potassium: 4.5 mmol/L (ref 3.5–5.1)
Sodium: 140 mmol/L (ref 135–145)

## 2021-10-09 NOTE — Patient Instructions (Signed)
Labs done today. We will contact you only if your labs are abnormal.  STOP taking Amlodipine  No other medication changes were made. Please continue all current medications as prescribed.  Your physician recommends that you schedule a follow-up appointment in: 4 months with Dr. Haroldine Laws  If you have any questions or concerns before your next appointment please send [place] a message through [facility] or call our office at [phone].    TO LEAVE A MESSAGE FOR THE NURSE SELECT OPTION 2, PLEASE LEAVE A MESSAGE INCLUDING: YOUR NAME DATE OF BIRTH CALL BACK NUMBER REASON FOR CALL**this is important as we prioritize the call backs  YOU WILL RECEIVE A CALL BACK THE SAME DAY AS LONG AS YOU CALL BEFORE 4:00 PM   Do the following things EVERYDAY: Weigh yourself in the morning before breakfast. Write it down and keep it in a log. Take your medicines as prescribed Eat low salt foods--Limit salt (sodium) to 2000 mg per day.  Stay as active as you can everyday Limit all fluids for the day to less than 2 liters   At the [facility], you and your health needs are our priority. As part of our continuing mission to provide you with exceptional heart care, we have created designated Provider Care Teams. These Care Teams include your primary Cardiologist (physician) and Advanced Practice Providers (APPs- Physician Assistants and Nurse Practitioners) who all work together to provide you with the care you need, when you need it.   You may see any of the following providers on your designated Care Team at your next follow up: Dr Glori Bickers Dr Haynes Kerns, NP Lyda Jester, [place] Audry Riles, PharmD   Please be sure to bring in all your medications bottles to every appointment.

## 2021-10-09 NOTE — Progress Notes (Signed)
[facility] Note   Date:  10/09/2021   ID:  Christine Mcdowell, DOB September 01, 1971, MRN [id number]  Location: Home  Provider location: [facility] Type of Visit: Established patient  PCP:  [name], FNP  EP: Dr. 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    Admitted in October 2017 with increased dyspnea and volume overload. Diuresed with IV lasix and transitioned to lasix 40 mg twice a day. L/RHC normal filling pressures, EF ~20%, and normal coronaries. Discharge weight 184 pounds.   Cardiac MRI in 02/2015 showed EF 46%. No LGE.   Echo 02/12/19 EF: 40-45%, RV normal.  Echo 8/21: EF ~ 40%-45%, Grade II DD, moderate MR.   CPX 9/19 with relaltively normal spirometry. Mild HF limitation.    Had R TKA 3/20.   09/13/21 had foot surgery, MTP fusion. Remains in boot.  At recent [facility] f/u, BP was elevated and amlodipine 5 mg daily was added to regimen.   3 days ago, 10/06/21, she was seen in the ED for SOB. D-dimer was elevated at 2.41 but chest CT was negative for PE. CT also negative for edema. No PNA. BNP was normal 40. HS trop neg x2 3>>2. COVID negative. Her symptoms were felt to be 2/2 bronchitis from lupus flare (meds were recently held for foot surgery to promote healing). She was treated w/ nebs and symptoms improved. She was also noted to be hypokalemic at 2.9 and K supp given.  Today she returns for HF follow up. Feels better. Resting dyspnea resolved. Stable NYHA Class II symptoms. Wt has been stable at home. She reports that she has only been taking torsemide PRN and has not had to use lately. Device interrogated and Fluid  index has been below threshold over the last 2 months.  Heart Logic Score 11. BP is well controlled. She is no longer taking amlodipine, had low BPs after starting it w/ systolic's in the 67Y-19J. Symptomatic w/ dizziness. SBP 128 today off amlodipine. No further dizziness. Compliant w/ all other meds. Adheres to low sodium diet and fluid restriction. Checks wt daily.   Sleep study completed. Results pending.   Echo today 10/09/21 EF 45-50% mild MR, personally reviewed by Dr. Haroldine Laws    Device interrogation: Fluid index < threshold. Heart Logic Score 11. No V therapies. Activity level 2.7 hr/day   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   Anemia    Anginal pain (HCC)    Asthma    Cervical cancer ([place])    cervical 1996   CHF (congestive heart failure) ([place])    Diabetes mellitus without complication ([place])    steroid induced   Discoid lupus    Fibromyalgia    History of blood transfusion "several"   "related to anemia; had some w/hysterectomy also"   Hx of cardiovascular stress test    ETT-Myoview (9/15):  No ischemia, EF 52%; NORMAL   Hx of echocardiogram    Echo (9/15):  EF 50-55%, ant HK, Gr 1 DD, mild MR, mild LAE, no effusion   Hypertension    Iron deficiency anemia    h/o iron transfusions   Lupus (systemic lupus erythematosus) ([place])  Migraine    "a few/year" (07/03/2016)   Pneumonia 12/2015   RA (rheumatoid arthritis) ([place])    "all over" (07/03/2016)   Sickle cell trait ([place])    Stroke ([place]) 2014 X 1; 2015 X 2; 2016 X 1;    "right side of face more relaxed than the other; rare speech hesitation" (07/03/2016)   Vaginal Pap smear, abnormal    ASCUS; HPV   Past Surgical History:  Procedure Laterality Date   ABDOMINAL HYSTERECTOMY  2009   ABDOMINAL WOUND DEHISCENCE  2009   BUNIONECTOMY Left 06/15/2020   CARDIAC CATHETERIZATION N/A 10/11/2016   Procedure: Right/Left Heart Cath and Coronary Angiography;   Surgeon: Jolaine Artist, MD;  Location: [facility];  Service: Cardiovascular;  Laterality: N/A;   DILATION AND CURETTAGE OF UTERUS  1991   HEMATOMA EVACUATION  2009   abdomen   ICD IMPLANT  01/28/2018   ICD IMPLANT N/A 01/28/2018   Procedure: ICD IMPLANT;  Surgeon: Evans Lance, MD;  Location: [place] [facility];  Service: Cardiovascular;  Laterality: N/A;   INCISE AND DRAIN ABCESS  2009 X 2   "abdomen after hysterectomy"   KNEE ARTHROSCOPY Right 1997   KNEE SURGERY Right    TUBAL LIGATION  1996   Current Outpatient Medications  Medication Sig Dispense Refill   albuterol (ACCUNEB) 1.25 MG/3ML nebulizer solution Take 3 mLs (1.25 mg total) by nebulization every 6 (six) hours as needed for wheezing. 75 mL 0   aspirin 81 MG chewable tablet Chew 162 mg by mouth daily. Taking 2 tablets     atorvastatin (LIPITOR) 10 MG tablet Take 1 tablet (10 mg total) by mouth daily. 90 tablet 2   budesonide-formoterol (SYMBICORT) 80-4.5 MCG/ACT inhaler Inhale 2 puffs into the lungs 2 (two) times daily. in the morning and at bedtime. (Patient taking differently: Inhale 2 puffs into the lungs 2 (two) times daily. in the morning and at bedtime as needed) 10.2 g 1   carvedilol (COREG) 25 MG tablet TAKE ONE TABLET BY MOUTH EVERY MORNING and TAKE ONE TABLET BY MOUTH EVERY EVENING 60 tablet 6   Continuous Blood Gluc Receiver (DEXCOM G6 RECEIVER) DEVI 1 Device by Does not apply route 3 (three) times daily before meals. 1 each 1   Continuous Blood Gluc Sensor (DEXCOM G6 SENSOR) MISC Inject 1 each into the skin 3 (three) times daily. 3 each 1   Continuous Blood Gluc Transmit (DEXCOM G6 TRANSMITTER) MISC 1 Device by Does not apply route 3 (three) times daily before meals. 1 each 1   doxycycline (VIBRAMYCIN) 100 MG capsule Take 1 capsule (100 mg total) by mouth 2 (two) times daily. 28 capsule 0   ENTRESTO 97-103 MG TAKE ONE TABLET BY MOUTH TWICE DAILY 650 tablet 0   folic acid (FOLVITE) 1 MG tablet TAKE 1  TABLET BY MOUTH EVERY MORNING 30 tablet 0   Glucagon (GVOKE HYPOPEN 2-PACK) 1 MG/0.2ML SOAJ Inject 1 mg into the skin as needed. 0.2 mL 5   hydrALAZINE (APRESOLINE) 25 MG tablet Take 1 tablet (25 mg total) by mouth 3 (three) times daily. Please schedule appointment for future refills. Thank you 270 tablet 3   hydroxychloroquine (PLAQUENIL) 200 MG tablet Take 1 tablet (200 mg total) by mouth 2 (two) times daily. 180 tablet 0   insulin lispro (HUMALOG KWIKPEN) 100 UNIT/ML KwikPen Use according to sliding scale 15 mL 11   Insulin Pen Needle (NOVOFINE PLUS PEN NEEDLE) 32G X 4 MM MISC Use with insulin  pens dx code e11.65 300 each 3   Magnesium 400 MG TABS Take 1 tablet by mouth daily.      meloxicam (MOBIC) 15 MG tablet Take 15 mg by mouth daily.      metFORMIN (GLUCOPHAGE) 500 MG tablet TAKE ONE TABLET BY MOUTH TWICE DAILY 180 tablet 1   methotrexate (RHEUMATREX) 2.5 MG tablet Take 20 mg by mouth every Friday.   3   metolazone (ZAROXOLYN) 2.5 MG tablet TAKE 1 TABLET BY MOUTH AS NEEDED FOR WEIGHT GAIN OF 5 POUNDS WITHIN 3 DAYS AS DIRECTED needs appt for further refills 5 tablet 3   montelukast (SINGULAIR) 10 MG tablet TAKE ONE TABLET BY MOUTH EVERY MORNING (Patient taking differently: As needed) 90 tablet 1   Multiple Vitamin (MULTIVITAMIN WITH MINERALS) TABS Take 1 tablet by mouth every morning.      ondansetron (ZOFRAN-ODT) 4 MG disintegrating tablet DISSOLVE ONE TABLET UNDER THE TONGUE EVERY TWELVE HOURS AS NEEDED 20 tablet 1   PARoxetine (PAXIL) 10 MG tablet TAKE ONE TABLET BY MOUTH EVERY MORNING 30 tablet 2   potassium chloride SA (KLOR-CON) 10 MEQ tablet Take 3 tablets (30 mEq total) by mouth 2 (two) times daily. 180 tablet 3   predniSONE (DELTASONE) 20 MG tablet Take 20 mg by mouth daily with breakfast.     predniSONE (DELTASONE) 20 MG tablet Take 2 tablets (40 mg total) by mouth daily. Take the 40mg  for 5 days and then take 20 mg (1 tab) po for 3 days and then 10mg (1/2 tab) for 4 days 15 tablet 0    PROAIR HFA 108 (90 Base) MCG/ACT inhaler INHALE 2 PUFFS BY MOUTH EVERY DAY AS NEEDED FOR SHORTNESS OF BREATH 18 g 2   spironolactone (ALDACTONE) 25 MG tablet TAKE ONE TABLET BY MOUTH EVERY MORNING and TAKE ONE TABLET BY MOUTH EVERY EVENING 60 tablet 3   torsemide (DEMADEX) 20 MG tablet Take 40 mg by mouth daily as needed.     traZODone (DESYREL) 50 MG tablet Take 50 mg by mouth at bedtime as needed.     Vitamin D, Ergocalciferol, (DRISDOL) 1.25 MG (50000 UNIT) CAPS capsule TAKE ONE CAPSULE BY MOUTH twice A WEEK ON ONCE WEEKLY ON TUESDAY AND ON ONCE WEEKLY ON FRIDAY 8 capsule 0   No current facility-administered medications for this encounter.   Allergies:   Hydrocodone-acetaminophen, Hydromorphone, Iodinated diagnostic agents, Other, Erythromycin, Latex, Rinvoq [upadacitinib], Farxiga [dapagliflozin], Tape, and Mircette [desogestrel-ethinyl estradiol]   Social History:  The patient  reports that she has never smoked. She has never used smokeless tobacco. She reports that she does not drink alcohol and does not use drugs.   Family History:  The patient's family history includes Allergies in her father and mother; Arthritis in her mother; Asthma in her maternal grandmother; Cancer in her paternal grandfather; Cushing syndrome in her father; Dementia in her paternal grandmother; Depression in her father; Diabetes in her maternal grandmother; Drug abuse in her mother; Heart attack in her father and maternal grandfather; Heart murmur in her mother; Hypertension in her maternal grandmother.   ROS:  Please see the history of present illness.   All other systems are personally reviewed and negative.   Recent Labs: 10/06/2021: B Natriuretic Peptide 40.3; BUN 14; Creatinine, Ser 1.10; Hemoglobin 10.5; Platelets 201; Potassium 2.7; Sodium 139  Personally reviewed   Wt Readings from Last 3 Encounters:  10/09/21 83.8 kg  10/06/21 87.1 kg  09/10/21 86 kg    BP 128/78   Pulse 91  Wt 83.8 kg   SpO2  100%   BMI 29.83 kg/m   PHYSICAL EXAM: General:  Well appearing. No respiratory difficulty HEENT: normal Neck: supple. no JVD. Carotids 2+ bilat; no bruits. No lymphadenopathy or thyromegaly appreciated. Cor: PMI nondisplaced. Regular rate & rhythm. No rubs, gallops or murmurs. Lungs: clear Abdomen: soft, nontender, nondistended. No hepatosplenomegaly. No bruits or masses. Good bowel sounds. Extremities: no cyanosis, clubbing, rash, edema, left foot boot  Neuro: alert & oriented x 3, cranial nerves grossly intact. moves all 4 extremities w/o difficulty. Affect pleasant.   Device Interrogation: HL Score 11, 2.7 hrs/day activity, no shocks (personally reviewed).  ASSESSMENT AND PLAN:  1. Chronic Systolic Heart Failure - NICM. Cath 10/11/16 with normal cors.  - cMRI in 02/2015 showed EF 46%. No LGE. - CPX 10/2016: Peak VO2: 18.5 (82% predicted peak VO2), VE/VCO2 slope: 30.  - Echo 12/18 EF 25-30%  - S/P [employer] HeartLogic ICD 01/2018.  - Repeat CPX 9/19 with mild HR limitation. - Echo 02/12/19: EF 40-45% - Echo 8/21: EF ~ 40%-45%, Grade II DD, moderate MR. - Echo today 10/09/21 EF 45-50% mild MR  - NYHA II- III. Volume good on exam and HL Score. - Continue torsemide 40 mg PRN. Discussed daily wts  - Continue spiro 25 mg bid. - Continue Coreg 25 mg bid. - Continue Entresto 97/103 mg bid.  - Off Farxiga due to recurrent yeast infections. - Continue hydralazine 100 mg tid. Not on Imdur due to headaches.  - EF out of range for barostim device.   2. HTN  - Controlled on current regimen - Now off amlodipine. Did not tolerate due to low BP  - GDMT per above  - BMP today   3. Sinus tach vs atrial flutter on ICD - Zio patch 9/20 Rare PVCs and PACs. - denies palpitations.   4. OSA - completed sleep study, results pending   5. DM2  - Per PCP. She is using a Dexcom. - Off Farxiga due to recurrent yeast infections.  6. Hypokalemia - recent K 2.9 in ED on 10/15, supp  given - repeat BMP today   7. Lupus - on prednisone and Plaquenil   F/u w/ Dr. Haroldine Laws in 4 months   Signed, Lyda Jester, PA-C  10/09/2021 1:02 PM  [facility] [address] [facility] [place] [address] [phone] (office) [phone] (fax)

## 2021-10-09 NOTE — Progress Notes (Signed)
  Echocardiogram 2D Echocardiogram has been performed.  Christine Mcdowell 10/09/2021, 10:55 AM

## 2021-10-11 ENCOUNTER — Telehealth: Payer: Medicare Other

## 2021-10-11 ENCOUNTER — Other Ambulatory Visit: Payer: Self-pay

## 2021-10-11 ENCOUNTER — Ambulatory Visit (INDEPENDENT_AMBULATORY_CARE_PROVIDER_SITE_OTHER): Payer: Medicare Other | Admitting: Nurse Practitioner

## 2021-10-11 ENCOUNTER — Ambulatory Visit (INDEPENDENT_AMBULATORY_CARE_PROVIDER_SITE_OTHER): Payer: Medicare Other

## 2021-10-11 ENCOUNTER — Encounter: Payer: Self-pay | Admitting: Nurse Practitioner

## 2021-10-11 VITALS — BP 110/80 | HR 72 | Temp 98.7°F | Ht 66.0 in | Wt 184.0 lb

## 2021-10-11 DIAGNOSIS — M329 Systemic lupus erythematosus, unspecified: Secondary | ICD-10-CM

## 2021-10-11 DIAGNOSIS — J209 Acute bronchitis, unspecified: Secondary | ICD-10-CM

## 2021-10-11 DIAGNOSIS — I110 Hypertensive heart disease with heart failure: Secondary | ICD-10-CM

## 2021-10-11 DIAGNOSIS — I10 Essential (primary) hypertension: Secondary | ICD-10-CM

## 2021-10-11 DIAGNOSIS — I5022 Chronic systolic (congestive) heart failure: Secondary | ICD-10-CM

## 2021-10-11 DIAGNOSIS — J4520 Mild intermittent asthma, uncomplicated: Secondary | ICD-10-CM

## 2021-10-11 DIAGNOSIS — M069 Rheumatoid arthritis, unspecified: Secondary | ICD-10-CM

## 2021-10-11 DIAGNOSIS — Z794 Long term (current) use of insulin: Secondary | ICD-10-CM

## 2021-10-11 DIAGNOSIS — Z0000 Encounter for general adult medical examination without abnormal findings: Secondary | ICD-10-CM | POA: Diagnosis not present

## 2021-10-11 DIAGNOSIS — E1165 Type 2 diabetes mellitus with hyperglycemia: Secondary | ICD-10-CM

## 2021-10-11 DIAGNOSIS — Z95810 Presence of automatic (implantable) cardiac defibrillator: Secondary | ICD-10-CM | POA: Diagnosis not present

## 2021-10-11 DIAGNOSIS — E1169 Type 2 diabetes mellitus with other specified complication: Secondary | ICD-10-CM

## 2021-10-11 MED ORDER — COMBIVENT RESPIMAT 20-100 MCG/ACT IN AERS: 1.0000 | INHALATION_SPRAY | Freq: Four times a day (QID) | RESPIRATORY_TRACT | 5 refills | Status: DC

## 2021-10-11 MED ORDER — TRESIBA FLEXTOUCH 100 UNIT/ML ~~LOC~~ SOPN: 10.0000 [IU] | PEN_INJECTOR | Freq: Every day | SUBCUTANEOUS | 0 refills | Status: DC

## 2021-10-11 MED ORDER — SHINGRIX 50 MCG/0.5ML IM SUSR: 0.5000 mL | Freq: Once | INTRAMUSCULAR | 1 refills | Status: AC

## 2021-10-11 MED ORDER — IPRATROPIUM-ALBUTEROL 0.5-2.5 (3) MG/3ML IN SOLN: 3.0000 mL | Freq: Four times a day (QID) | RESPIRATORY_TRACT | 2 refills | Status: DC | PRN

## 2021-10-11 NOTE — Progress Notes (Addendum)
This visit occurred during the SARS-CoV-2 public health emergency.  Safety protocols were in place, including screening questions prior to the visit, additional usage of staff PPE, and extensive cleaning of exam room while observing appropriate contact time as indicated for disinfecting solutions.  Subjective:     Patient ID: Christine Mcdowell , female    DOB: 02-Apr-1971 , 50 y.o.   MRN: [id number]   Chief Complaint  Patient presents with   Annual Exam    HPI  Pt presents for HM. She was admitted to the ED over the weekend for bronchitis / asthma. She also resumed taking prednisone this past Saturday. EKG completed on 10/06/2021.   She had reconstructive surgery on Sept 22nd, had to remove screws and repair with a plat to left foot.   She continues to be followed by Rheumatology, orthopedics, cardiologist    Checking blood sugar 4 times a day. Blood sugars have been fluctuating, increases at night. She is administering according to the sliding scale in the evenings.  She is now back on prednisone. She is on everyday for 5 days and then will be tapering afterwards. She is also being treated with prednisone by her Rheumatologist.    She will take her insulin up to 4 times a night using the sliding scale because her blood sugars are in the 300's - 400's. She will have increased frequency of urination.   Diabetes She presents for her follow-up diabetic visit. She has type 2 diabetes mellitus. Pertinent negatives for hypoglycemia include no confusion or nervousness/anxiousness. There are no diabetic associated symptoms. Pertinent negatives for diabetes include no polydipsia, no polyphagia and no polyuria. (She has drank juice to help increase her blood sugars. ) There are no diabetic complications. Risk factors for coronary artery disease include sedentary lifestyle and obesity. Current diabetic treatment includes oral agent (dual therapy). She is compliant with treatment all of the time. She is  following a generally healthy diet. When asked about meal planning, she reported none. She has not had a previous visit with a dietitian. She rarely participates in exercise. (See above) An ACE inhibitor/angiotensin II receptor blocker is being taken. She does not see a podiatrist.Eye exam is not current.    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   Anemia    Anginal pain (HCC)    Asthma    Cervical cancer ([place])    cervical 1996   CHF (congestive heart failure) ([place])    Diabetes mellitus without complication ([place])    steroid induced   Discoid lupus    Fibromyalgia    History of blood transfusion "several"   "related to anemia; had some w/hysterectomy also"   Hx of cardiovascular stress test    ETT-Myoview (9/15):  No ischemia, EF 52%; NORMAL   Hx of echocardiogram    Echo (9/15):  EF 50-55%, ant HK, Gr 1 DD, mild MR, mild LAE, no effusion   Hypertension    Iron deficiency anemia    h/o iron transfusions   Lupus (systemic lupus erythematosus) ([place])    Migraine    "a few/year" (07/03/2016)   Pneumonia 12/2015   RA (rheumatoid arthritis) ([place])    "all over" (07/03/2016)   Sickle cell trait ([place])    Stroke ([place]) 2014 X 1; 2015 X 2; 2016 X 1;    "right side of face more relaxed than the other; rare speech hesitation" (07/03/2016)   Vaginal Pap smear, abnormal    ASCUS; HPV  Family History  Problem Relation Age of Onset   Arthritis Mother    Heart murmur Mother    Drug abuse Mother    Allergies Mother    Heart attack Father    Cushing syndrome Father    Depression Father    Allergies Father    Dementia Paternal Grandmother    Cancer Paternal Grandfather    Diabetes Maternal Grandmother    Hypertension Maternal Grandmother    Asthma Maternal Grandmother    Heart attack Maternal Grandfather    Breast cancer Neg Hx      Current Outpatient Medications:    aspirin 81 MG chewable tablet, Chew 162 mg by mouth  daily. Taking 2 tablets, Disp: , Rfl:    atorvastatin (LIPITOR) 10 MG tablet, Take 1 tablet (10 mg total) by mouth daily., Disp: 90 tablet, Rfl: 2   carvedilol (COREG) 25 MG tablet, TAKE ONE TABLET BY MOUTH EVERY MORNING and TAKE ONE TABLET BY MOUTH EVERY EVENING, Disp: 60 tablet, Rfl: 6   Continuous Blood Gluc Receiver (DEXCOM G6 RECEIVER) DEVI, 1 Device by Does not apply route 3 (three) times daily before meals., Disp: 1 each, Rfl: 1   Continuous Blood Gluc Sensor (DEXCOM G6 SENSOR) MISC, Inject 1 each into the skin 3 (three) times daily., Disp: 3 each, Rfl: 1   Continuous Blood Gluc Transmit (DEXCOM G6 TRANSMITTER) MISC, 1 Device by Does not apply route 3 (three) times daily before meals., Disp: 1 each, Rfl: 1   doxycycline (VIBRAMYCIN) 100 MG capsule, Take 1 capsule (100 mg total) by mouth 2 (two) times daily., Disp: 28 capsule, Rfl: 0   ENTRESTO 97-103 MG, TAKE ONE TABLET BY MOUTH TWICE DAILY, Disp: 180 tablet, Rfl: 0   folic acid (FOLVITE) 1 MG tablet, TAKE 1 TABLET BY MOUTH EVERY MORNING, Disp: 30 tablet, Rfl: 0   Glucagon (GVOKE HYPOPEN 2-PACK) 1 MG/0.2ML SOAJ, Inject 1 mg into the skin as needed., Disp: 0.2 mL, Rfl: 5   hydrALAZINE (APRESOLINE) 25 MG tablet, Take 1 tablet (25 mg total) by mouth 3 (three) times daily. Please schedule appointment for future refills. Thank you, Disp: 270 tablet, Rfl: 3   hydroxychloroquine (PLAQUENIL) 200 MG tablet, Take 1 tablet (200 mg total) by mouth 2 (two) times daily., Disp: 180 tablet, Rfl: 0   insulin degludec (TRESIBA FLEXTOUCH) 100 UNIT/ML FlexTouch Pen, Inject 10 Units into the skin daily., Disp: 9 mL, Rfl: 0   insulin lispro (HUMALOG KWIKPEN) 100 UNIT/ML KwikPen, Use according to sliding scale, Disp: 15 mL, Rfl: 11   Insulin Pen Needle (NOVOFINE PLUS PEN NEEDLE) 32G X 4 MM MISC, Use with insulin pens dx code e11.65, Disp: 300 each, Rfl: 3   Ipratropium-Albuterol (COMBIVENT RESPIMAT) 20-100 MCG/ACT AERS respimat, Inhale 1 puff into the lungs every 6  (six) hours., Disp: 4 g, Rfl: 5   ipratropium-albuterol (DUONEB) 0.5-2.5 (3) MG/3ML SOLN, Take 3 mLs by nebulization every 6 (six) hours as needed., Disp: 360 mL, Rfl: 2   Magnesium 400 MG TABS, Take 1 tablet by mouth daily. , Disp: , Rfl:    meloxicam (MOBIC) 15 MG tablet, Take 15 mg by mouth daily. , Disp: , Rfl:    methotrexate (RHEUMATREX) 2.5 MG tablet, Take 20 mg by mouth every Friday. , Disp: , Rfl: 3   metolazone (ZAROXOLYN) 2.5 MG tablet, TAKE 1 TABLET BY MOUTH AS NEEDED FOR WEIGHT GAIN OF 5 POUNDS WITHIN 3 DAYS AS DIRECTED needs appt for further refills, Disp: 5 tablet, Rfl: 3  Multiple Vitamin (MULTIVITAMIN WITH MINERALS) TABS, Take 1 tablet by mouth every morning. , Disp: , Rfl:    ondansetron (ZOFRAN-ODT) 4 MG disintegrating tablet, DISSOLVE ONE TABLET UNDER THE TONGUE EVERY TWELVE HOURS AS NEEDED, Disp: 20 tablet, Rfl: 1   PARoxetine (PAXIL) 10 MG tablet, TAKE ONE TABLET BY MOUTH EVERY MORNING, Disp: 30 tablet, Rfl: 2   potassium chloride SA (KLOR-CON) 10 MEQ tablet, Take 3 tablets (30 mEq total) by mouth 2 (two) times daily., Disp: 180 tablet, Rfl: 3   predniSONE (DELTASONE) 20 MG tablet, Take 20 mg by mouth daily with breakfast., Disp: , Rfl:    predniSONE (DELTASONE) 20 MG tablet, Take 2 tablets (40 mg total) by mouth daily. Take the $Remove'40mg'EXwtdiy$  for 5 days and then take 20 mg (1 tab) po for 3 days and then $Remove'10mg'bmSOgQS$ (1/2 tab) for 4 days, Disp: 15 tablet, Rfl: 0   PROAIR HFA 108 (90 Base) MCG/ACT inhaler, INHALE 2 PUFFS BY MOUTH EVERY DAY AS NEEDED FOR SHORTNESS OF BREATH, Disp: 18 g, Rfl: 2   traZODone (DESYREL) 50 MG tablet, Take 50 mg by mouth at bedtime as needed., Disp: , Rfl:    Vitamin D, Ergocalciferol, (DRISDOL) 1.25 MG (50000 UNIT) CAPS capsule, TAKE ONE CAPSULE BY MOUTH twice A WEEK ON ONCE WEEKLY ON TUESDAY AND ON ONCE WEEKLY ON FRIDAY, Disp: 8 capsule, Rfl: 0   budesonide-formoterol (SYMBICORT) 80-4.5 MCG/ACT inhaler, Inhale 2 puffs into the lungs 2 (two) times daily. in the morning  and at bedtime. (Patient taking differently: Inhale 2 puffs into the lungs 2 (two) times daily. in the morning and at bedtime as needed), Disp: 10.2 g, Rfl: 1   metFORMIN (GLUCOPHAGE) 500 MG tablet, TAKE ONE TABLET BY MOUTH TWICE DAILY, Disp: 180 tablet, Rfl: 1   montelukast (SINGULAIR) 10 MG tablet, TAKE ONE TABLET BY MOUTH EVERY MORNING, Disp: 90 tablet, Rfl: 1   spironolactone (ALDACTONE) 25 MG tablet, TAKE ONE TABLET BY MOUTH EVERY MORNING and TAKE ONE TABLET BY MOUTH EVERY EVENING (Patient not taking: Reported on 10/11/2021), Disp: 60 tablet, Rfl: 3   torsemide (DEMADEX) 20 MG tablet, Take 40 mg by mouth daily as needed. (Patient not taking: Reported on 10/11/2021), Disp: , Rfl:    Allergies  Allergen Reactions   Hydrocodone-Acetaminophen Nausea And Vomiting   Hydromorphone Nausea And Vomiting    Other reaction(s): GI Upset (intolerance), Hypertension (intolerance) Raises blood pressure  Other reaction(s): GI Upset (intolerance), Hypertension (intolerance) Raises blood pressure to stroke level   Iodinated Diagnostic Agents Other (See Comments)    Shuts down kidneys Shuts kidney function down   Other Other (See Comments) and Anaphylaxis    Spicy foods and seasonings Skin Prep "makes my skin peel off" Paper tape causes skin burns   Erythromycin Nausea And Vomiting   Latex Hives   Rinvoq [Upadacitinib] Other (See Comments)   Farxiga [Dapagliflozin] Other (See Comments)   Tape Other (See Comments)    "Skin burns"   Mircette [Desogestrel-Ethinyl Estradiol] Nausea And Vomiting and Rash      The patient states she is status post hysterectomy. No LMP recorded. Patient has had a hysterectomy.. Negative for Dysmenorrhea and Negative for Menorrhagia. Negative for: breast discharge, breast lump(s), breast pain and breast self exam. Associated symptoms include abnormal vaginal bleeding. Pertinent negatives include abnormal bleeding (hematology), anxiety, decreased libido, depression,  difficulty falling sleep, dyspareunia, history of infertility, nocturia, sexual dysfunction, sleep disturbances, urinary incontinence, urinary urgency, vaginal discharge and vaginal itching. Diet heart healthy/diabetic.  The patient states  her exercise level- she is getting 2-3 hours a day of physical activity movement.    The patient's tobacco use is:  Social History   Tobacco Use  Smoking Status Never  Smokeless Tobacco Never   She has been exposed to passive smoke. The patient's alcohol use is:  Social History   Substance and Sexual Activity  Alcohol Use No    Review of Systems  Constitutional: Negative.   HENT: Negative.    Eyes: Negative.   Respiratory: Negative.    Cardiovascular: Negative.   Gastrointestinal: Negative.   Endocrine: Negative.  Negative for polydipsia, polyphagia and polyuria.  Musculoskeletal: Negative.   Neurological: Negative.   Psychiatric/Behavioral:  Negative for confusion. The patient is not nervous/anxious.     Today's Vitals   10/11/21 1152  BP: 110/80  Pulse: 72  Temp: 98.7 F (37.1 C)  Weight: 184 lb (83.5 kg)  Height: $Remove'5\' 6"'sRRfKAg$  (1.676 m)   Body mass index is 29.7 kg/m.   Objective:  Physical Exam Constitutional:      General: She is not in acute distress.    Appearance: Normal appearance. She is well-developed. She is obese.  HENT:     Head: Normocephalic and atraumatic.     Right Ear: Hearing, tympanic membrane, ear canal and external ear normal. There is no impacted cerumen.     Left Ear: Hearing, tympanic membrane, ear canal and external ear normal. There is no impacted cerumen.     Nose:     Comments: Deferred - masked    Mouth/Throat:     Comments: Deferred - masked Eyes:     General: Lids are normal.     Extraocular Movements: Extraocular movements intact.     Conjunctiva/sclera: Conjunctivae normal.     Pupils: Pupils are equal, round, and reactive to light.     Funduscopic exam:    Right eye: No papilledema.        Left  eye: No papilledema.  Neck:     Thyroid: No thyroid mass.     Vascular: No carotid bruit.  Cardiovascular:     Rate and Rhythm: Normal rate and regular rhythm.     Pulses: Normal pulses.     Heart sounds: Normal heart sounds. No murmur heard. Pulmonary:     Effort: Pulmonary effort is normal. No respiratory distress.     Breath sounds: Normal breath sounds. No wheezing.  Abdominal:     General: Abdomen is flat. Bowel sounds are normal. There is no distension.     Palpations: Abdomen is soft.     Tenderness: There is no abdominal tenderness.  Musculoskeletal:        General: No swelling or tenderness. Normal range of motion.     Cervical back: Full passive range of motion without pain, normal range of motion and neck supple.     Right lower leg: No edema.     Left lower leg: No edema.  Skin:    General: Skin is warm and dry.     Capillary Refill: Capillary refill takes less than 2 seconds.  Neurological:     General: No focal deficit present.     Mental Status: She is alert and oriented to person, place, and time.     Cranial Nerves: No cranial nerve deficit.     Sensory: No sensory deficit.     Motor: No weakness.  Psychiatric:        Mood and Affect: Mood normal.  Behavior: Behavior normal.        Thought Content: Thought content normal.        Judgment: Judgment normal.        Assessment And Plan:     1. Annual physical exam Behavior modifications discussed and diet history reviewed.   Pt will continue to exercise regularly and modify diet with low GI, plant based foods and decrease intake of processed foods.  Recommend intake of daily multivitamin, Vitamin D, and calcium.  Recommend mammogram and colonoscopy for preventive screenings, as well as recommend immunizations that include influenza, TDAP, and shingles - POCT Urinalysis Dipstick (81002) - POCT UA - Microalbumin - CMP14+EGFR - CBC - Hemoglobin A1c - Lipid panel  2. Type 2 diabetes mellitus with  other specified complication, with long-term current use of insulin (HCC) Blood sugars have been fluctuating since being back on prednisone She has been injecting herself with insulin daily and 3 times PRN based on blood sugars - CMP14+EGFR - insulin degludec (TRESIBA FLEXTOUCH) 100 UNIT/ML FlexTouch Pen; Inject 10 Units into the skin daily.  Dispense: 9 mL; Refill: 0  3. Hypertensive heart disease with congestive heart failure, unspecified heart failure type  Blood pressure is well controlled  4. Acute bronchitis, unspecified organism - ipratropium-albuterol (DUONEB) 0.5-2.5 (3) MG/3ML SOLN; Take 3 mLs by nebulization every 6 (six) hours as needed.  Dispense: 360 mL; Refill: 2 - Ipratropium-Albuterol (COMBIVENT RESPIMAT) 20-100 MCG/ACT AERS respimat; Inhale 1 puff into the lungs every 6 (six) hours.  Dispense: 4 g; Refill: 5 - Ambulatory referral to Pulmonology  5. ICD (implantable cardioverter-defibrillator) in place  6. Chronic systolic (congestive) heart failure (HCC) Continue follow up with Dr. Tempie Hoist  7. Rheumatoid arthritis, involving unspecified site, unspecified whether rheumatoid factor present ([place]) Continue follow up with Rheumatology  8. Intermittent asthma without complication, unspecified asthma severity  9. Systemic lupus erythematosus, unspecified SLE type, unspecified organ involvement status ([place]) Continue with Rheumatology    Patient was given opportunity to ask questions. Patient verbalized understanding of the plan and was able to repeat key elements of the plan. All questions were answered to their satisfaction.   [name], FNP   I, [name], FNP, have reviewed all documentation for this visit. The documentation on 10/25/21 for the exam, diagnosis, procedures, and orders are all accurate and complete.  THE PATIENT IS ENCOURAGED TO PRACTICE SOCIAL DISTANCING DUE TO THE COVID-19 PANDEMIC.

## 2021-10-11 NOTE — Chronic Care Management (AMB) (Signed)
Chronic Care Management   CCM RN Visit Note  10/11/2021 Name: Christine Mcdowell MRN: [id number] DOB: December 14, 1971  Subjective: Christine Mcdowell is a 50 y.o. year old female who is a primary care patient of [name], [place]. The care management team was consulted for assistance with disease management and care coordination needs.    Collaboration with [facility] [place] [facility]  for  diabetes DME  in response to provider referral for case management and/or care coordination services.   Consent to Services:  The patient was given information about Chronic Care Management services, agreed to services, and gave verbal consent prior to initiation of services.  Please see initial visit note for detailed documentation.   Patient agreed to services and verbal consent obtained.   Assessment: Review of patient past medical history, allergies, medications, health status, including review of consultants reports, laboratory and other test data, was performed as part of comprehensive evaluation and provision of chronic care management services.   SDOH (Social Determinants of Health) assessments and interventions performed:    CCM Care Plan  Allergies  Allergen Reactions   Hydrocodone-Acetaminophen Nausea And Vomiting   Hydromorphone Nausea And Vomiting    Other reaction(s): GI Upset (intolerance), Hypertension (intolerance) Raises blood pressure  Other reaction(s): GI Upset (intolerance), Hypertension (intolerance) Raises blood pressure to stroke level   Iodinated Diagnostic Agents Other (See Comments)    Shuts down kidneys Shuts kidney function down   Other Other (See Comments) and Anaphylaxis    Spicy foods and seasonings Skin Prep "makes my skin peel off" Paper tape causes skin burns   Erythromycin Nausea And Vomiting   Latex Hives   Rinvoq [Upadacitinib] Other (See Comments)   Farxiga [Dapagliflozin] Other (See Comments)   Tape Other (See Comments)    "Skin burns"   Mircette  [Desogestrel-Ethinyl Estradiol] Nausea And Vomiting and Rash    Outpatient Encounter Medications as of 10/11/2021  Medication Sig   aspirin 81 MG chewable tablet Chew 162 mg by mouth daily. Taking 2 tablets   atorvastatin (LIPITOR) 10 MG tablet Take 1 tablet (10 mg total) by mouth daily.   budesonide-formoterol (SYMBICORT) 80-4.5 MCG/ACT inhaler Inhale 2 puffs into the lungs 2 (two) times daily. in the morning and at bedtime. (Patient taking differently: Inhale 2 puffs into the lungs 2 (two) times daily. in the morning and at bedtime as needed)   carvedilol (COREG) 25 MG tablet TAKE ONE TABLET BY MOUTH EVERY MORNING and TAKE ONE TABLET BY MOUTH EVERY EVENING   Continuous Blood Gluc Receiver (DEXCOM G6 RECEIVER) DEVI 1 Device by Does not apply route 3 (three) times daily before meals.   Continuous Blood Gluc Sensor (DEXCOM G6 SENSOR) MISC Inject 1 each into the skin 3 (three) times daily.   Continuous Blood Gluc Transmit (DEXCOM G6 TRANSMITTER) MISC 1 Device by Does not apply route 3 (three) times daily before meals.   doxycycline (VIBRAMYCIN) 100 MG capsule Take 1 capsule (100 mg total) by mouth 2 (two) times daily.   ENTRESTO 97-103 MG TAKE ONE TABLET BY MOUTH TWICE DAILY   folic acid (FOLVITE) 1 MG tablet TAKE 1 TABLET BY MOUTH EVERY MORNING   Glucagon (GVOKE HYPOPEN 2-PACK) 1 MG/0.2ML SOAJ Inject 1 mg into the skin as needed.   hydrALAZINE (APRESOLINE) 25 MG tablet Take 1 tablet (25 mg total) by mouth 3 (three) times daily. Please schedule appointment for future refills. Thank you   hydroxychloroquine (PLAQUENIL) 200 MG tablet Take 1 tablet (200 mg total) by mouth 2 (two) times  daily.   insulin degludec (TRESIBA FLEXTOUCH) 100 UNIT/ML FlexTouch Pen Inject 10 Units into the skin daily.   insulin lispro (HUMALOG KWIKPEN) 100 UNIT/ML KwikPen Use according to sliding scale   Insulin Pen Needle (NOVOFINE PLUS PEN NEEDLE) 32G X 4 MM MISC Use with insulin pens dx code e11.65   Ipratropium-Albuterol  (COMBIVENT RESPIMAT) 20-100 MCG/ACT AERS respimat Inhale 1 puff into the lungs every 6 (six) hours.   ipratropium-albuterol (DUONEB) 0.5-2.5 (3) MG/3ML SOLN Take 3 mLs by nebulization every 6 (six) hours as needed.   Magnesium 400 MG TABS Take 1 tablet by mouth daily.    meloxicam (MOBIC) 15 MG tablet Take 15 mg by mouth daily.    metFORMIN (GLUCOPHAGE) 500 MG tablet TAKE ONE TABLET BY MOUTH TWICE DAILY   methotrexate (RHEUMATREX) 2.5 MG tablet Take 20 mg by mouth every Friday.    metolazone (ZAROXOLYN) 2.5 MG tablet TAKE 1 TABLET BY MOUTH AS NEEDED FOR WEIGHT GAIN OF 5 POUNDS WITHIN 3 DAYS AS DIRECTED needs appt for further refills   montelukast (SINGULAIR) 10 MG tablet TAKE ONE TABLET BY MOUTH EVERY MORNING (Patient taking differently: As needed)   Multiple Vitamin (MULTIVITAMIN WITH MINERALS) TABS Take 1 tablet by mouth every morning.    ondansetron (ZOFRAN-ODT) 4 MG disintegrating tablet DISSOLVE ONE TABLET UNDER THE TONGUE EVERY TWELVE HOURS AS NEEDED   PARoxetine (PAXIL) 10 MG tablet TAKE ONE TABLET BY MOUTH EVERY MORNING   potassium chloride SA (KLOR-CON) 10 MEQ tablet Take 3 tablets (30 mEq total) by mouth 2 (two) times daily.   predniSONE (DELTASONE) 20 MG tablet Take 20 mg by mouth daily with breakfast.   predniSONE (DELTASONE) 20 MG tablet Take 2 tablets (40 mg total) by mouth daily. Take the $Remove'40mg'tVsocQG$  for 5 days and then take 20 mg (1 tab) po for 3 days and then $Remove'10mg'xRawVqx$ (1/2 tab) for 4 days   PROAIR HFA 108 (90 Base) MCG/ACT inhaler INHALE 2 PUFFS BY MOUTH EVERY DAY AS NEEDED FOR SHORTNESS OF BREATH   spironolactone (ALDACTONE) 25 MG tablet TAKE ONE TABLET BY MOUTH EVERY MORNING and TAKE ONE TABLET BY MOUTH EVERY EVENING (Patient not taking: Reported on 10/11/2021)   torsemide (DEMADEX) 20 MG tablet Take 40 mg by mouth daily as needed. (Patient not taking: Reported on 10/11/2021)   traZODone (DESYREL) 50 MG tablet Take 50 mg by mouth at bedtime as needed.   Vitamin D, Ergocalciferol, (DRISDOL)  1.25 MG (50000 UNIT) CAPS capsule TAKE ONE CAPSULE BY MOUTH twice A WEEK ON ONCE WEEKLY ON TUESDAY AND ON ONCE WEEKLY ON FRIDAY   Zoster Vaccine Adjuvanted [facility]) injection Inject 0.5 mLs into the muscle once for 1 dose.   No facility-administered encounter medications on file as of 10/11/2021.    Patient Active Problem List   Diagnosis Date Noted   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   History of COVID-19 04/12/2020   COVID-19 virus infection 03/07/2020   Lower abdominal pain 38/32/9191   Chronic systolic (congestive) heart failure ([place]) 01/28/2018   Cough 01/19/2018   Pulmonary infiltrates on CXR 01/19/2018   Migraines 10/08/2017   Sleep apnea with use of continuous positive airway pressure (CPAP) 09/24/2017   Rheumatoid arthritis ([place]) 10/09/2016   Asthma 10/09/2016   Chronic pain 10/09/2016   Heart failure ([place]) 10/09/2016   Congestive heart failure ([place])    Lupus (systemic lupus erythematosus) ([place])    Diabetes mellitus with complication (HCC)    Anxiety state    GERD (gastroesophageal reflux disease) 12/28/2015  Essential hypertension 12/26/2015   Type 2 diabetes mellitus ([place]) 45/80/9983   Chronic systolic heart failure ([place]) 08/18/2014   Cardiomyopathy, dilated ([place]) 01/27/2014   Shortness of breath 01/26/2014   SLE (systemic lupus erythematosus) ([place]) 01/26/2014    Conditions to be addressed/monitored: CHF, DM II, HTN, SLE, RA, Asthma   Care Plan : Diabetes Type 2 (Adult)  Updates made by [name], RN since 10/11/2021 12:00 AM     Problem: Glycemic Management (Diabetes, Type 2)   Priority: High     Long-Range Goal: Glycemic Management Optimized   Start Date: 12/27/2020  Expected End Date: 12/27/2021  Recent Progress: On track  Priority: High  Note:   Objective:  Lab Results  Component Value Date   HGBA1C 6.7 (H) 07/25/2021   Lab Results  Component Value Date   CREATININE 1.06 (H) 09/10/2021   CREATININE 0.86  08/30/2021   CREATININE 1.08 (H) 04/12/2021   No results found for: EGFR  Current Barriers:  Knowledge Deficits related to basic Diabetes pathophysiology and self care/management Knowledge Deficits related to medications used for management of diabetes Case Manager Clinical Goal(s):  Patient will demonstrate improved adherence to prescribed treatment plan for diabetes self care/management as evidenced by:  daily monitoring and recording of CBG  adherence to ADA/ carb modified diet exercise 3-5 days/week adherence to prescribed medication regimen Interventions:  09/10/21 completed successful outbound call with patient  Collaboration with [name] FNP regarding development and update of comprehensive plan of care as evidenced by provider attestation and co-signature Inter-disciplinary care team collaboration (see longitudinal plan of care) Provided education to patient about basic DM disease process Review of patient status, including review of consultants reports, relevant laboratory and other test results, and medications completed. Reviewed medications with patient and discussed importance of medication adherence Advised patient, providing education and rationale, to check cbg before meals and bedtime and record, calling PCP and or CCM team for findings outside established parameters.   Reviewed scheduled/upcoming provider appointments including: next PCP follow up appointment scheduled for 10/11/21 $RemoveBefo'@11'iijdUCrrbDF$ :45 AM  Mailed printed educational materials related to Low Carb Smoothies; Carb Choice list; Meal Planning; Mediterranean diet  Discussed plans with patient for ongoing care management follow up and provided patient with direct contact information for care management team 10/11/21 Case Collaboration  Collaborated with PCP regarding patients Dexcom Scanner Determined patient has not yet received her Dexcom Scanner after trying to obtain it from [place] x several months  Placed  outbound call to [employer], spoke with Vickii Penna, confirmed the PCP may fax an Rx with type of device needed and diagnosis, including any supporting clinical documentation, PCP contact number, insurance and demographics faxed to [phone] Forwarded this information to PCP via secure chat Patient Goals/Self-Care Activities - check blood sugar at prescribed times - check blood sugar before and after exercise - check blood sugar if I feel it is too high or too low - enter blood sugar readings and medication or insulin into daily log - take the blood sugar log to all doctor visits - take the blood sugar meter to all doctor visits  - follow sliding scale per PCP recommendations for treatment of hyperglycemia during use of steroids  Follow Up Plan: Telephone follow up appointment with care management team member scheduled for: 10/15/21    Plan:Telephone follow up appointment with care management team member scheduled for:  10/15/21  Barb Merino, RN, BSN, CCM Care Management Coordinator [facility]  Direct Phone:  [phone]           

## 2021-10-11 NOTE — Patient Instructions (Signed)
Health Maintenance, Female Adopting a healthy lifestyle and getting preventive care are important in promoting health and wellness. Ask your health care provider about: The right schedule for you to have regular tests and exams. Things you can do on your own to prevent diseases and keep yourself healthy. What should I know about diet, weight, and exercise? Eat a healthy diet  Eat a diet that includes plenty of vegetables, fruits, low-fat dairy products, and lean protein. Do not eat a lot of foods that are high in solid fats, added sugars, or sodium. Maintain a healthy weight Body mass index (BMI) is used to identify weight problems. It estimates body fat based on height and weight. Your health care provider can help determine your BMI and help you achieve or maintain a healthy weight. Get regular exercise Get regular exercise. This is one of the most important things you can do for your health. Most adults should: Exercise for at least 150 minutes each week. The exercise should increase your heart rate and make you sweat (moderate-intensity exercise). Do strengthening exercises at least twice a week. This is in addition to the moderate-intensity exercise. Spend less time sitting. Even light physical activity can be beneficial. Watch cholesterol and blood lipids Have your blood tested for lipids and cholesterol at 52 years of age, then have this test every 5 years. Have your cholesterol levels checked more often if: Your lipid or cholesterol levels are high. You are older than 52 years of age. You are at high risk for heart disease. What should I know about cancer screening? Depending on your health history and family history, you may need to have cancer screening at various ages. This may include screening for: Breast cancer. Cervical cancer. Colorectal cancer. Skin cancer. Lung cancer. What should I know about heart disease, diabetes, and high blood pressure? Blood pressure and heart  disease High blood pressure causes heart disease and increases the risk of stroke. This is more likely to develop in people who have high blood pressure readings, are of African descent, or are overweight. Have your blood pressure checked: Every 3-5 years if you are 18-39 years of age. Every year if you are 40 years old or older. Diabetes Have regular diabetes screenings. This checks your fasting blood sugar level. Have the screening done: Once every three years after age 40 if you are at a normal weight and have a low risk for diabetes. More often and at a younger age if you are overweight or have a high risk for diabetes. What should I know about preventing infection? Hepatitis B If you have a higher risk for hepatitis B, you should be screened for this virus. Talk with your health care provider to find out if you are at risk for hepatitis B infection. Hepatitis C Testing is recommended for: Everyone born from 1945 through 1965. Anyone with known risk factors for hepatitis C. Sexually transmitted infections (STIs) Get screened for STIs, including gonorrhea and chlamydia, if: You are sexually active and are younger than 52 years of age. You are older than 52 years of age and your health care provider tells you that you are at risk for this type of infection. Your sexual activity has changed since you were last screened, and you are at increased risk for chlamydia or gonorrhea. Ask your health care provider if you are at risk. Ask your health care provider about whether you are at high risk for HIV. Your health care provider may recommend a prescription medicine   to help prevent HIV infection. If you choose to take medicine to prevent HIV, you should first get tested for HIV. You should then be tested every 3 months for as long as you are taking the medicine. Pregnancy If you are about to stop having your period (premenopausal) and you may become pregnant, seek counseling before you get  pregnant. Take 400 to 800 micrograms (mcg) of folic acid every day if you become pregnant. Ask for birth control (contraception) if you want to prevent pregnancy. Osteoporosis and menopause Osteoporosis is a disease in which the bones lose minerals and strength with aging. This can result in bone fractures. If you are 65 years old or older, or if you are at risk for osteoporosis and fractures, ask your health care provider if you should: Be screened for bone loss. Take a calcium or vitamin D supplement to lower your risk of fractures. Be given hormone replacement therapy (HRT) to treat symptoms of menopause. Follow these instructions at home: Lifestyle Do not use any products that contain nicotine or tobacco, such as cigarettes, e-cigarettes, and chewing tobacco. If you need help quitting, ask your health care provider. Do not use street drugs. Do not share needles. Ask your health care provider for help if you need support or information about quitting drugs. Alcohol use Do not drink alcohol if: Your health care provider tells you not to drink. You are pregnant, may be pregnant, or are planning to become pregnant. If you drink alcohol: Limit how much you use to 0-1 drink a day. Limit intake if you are breastfeeding. Be aware of how much alcohol is in your drink. In the [place] one drink equals one 12 oz bottle of beer (355 mL), one 5 oz glass of wine (148 mL), or one 1 oz glass of hard liquor (44 mL). General instructions Schedule regular health, dental, and eye exams. Stay current with your vaccines. Tell your health care provider if: You often feel depressed. You have ever been abused or do not feel safe at home. Summary Adopting a healthy lifestyle and getting preventive care are important in promoting health and wellness. Follow your health care provider's instructions about healthy diet, exercising, and getting tested or screened for diseases. Follow your health care provider's  instructions on monitoring your cholesterol and blood pressure. This information is not intended to replace advice given to you by your health care provider. Make sure you discuss any questions you have with your health care provider. Document Revised: 02/16/2021 Document Reviewed: 12/02/2018 Elsevier Patient Education  2022 Elsevier Inc.  

## 2021-10-12 ENCOUNTER — Telehealth: Payer: Self-pay

## 2021-10-12 LAB — LIPID PANEL
Chol/HDL Ratio: 3.4 ratio (ref 0.0–4.4)
Cholesterol, Total: 181 mg/dL (ref 100–199)
HDL: 54 mg/dL (ref >39)
LDL Chol Calc (NIH): 100 mg/dL — ABNORMAL HIGH (ref 0–99)
Triglycerides: 153 mg/dL — ABNORMAL HIGH (ref 0–149)
VLDL Cholesterol Cal: 27 mg/dL (ref 5–40)

## 2021-10-12 LAB — CMP14+EGFR
ALT: 15 IU/L (ref 0–32)
AST: 13 IU/L (ref 0–40)
Albumin/Globulin Ratio: 2 (ref 1.2–2.2)
Albumin: 4.9 g/dL (ref 3.8–4.9)
Alkaline Phosphatase: 68 IU/L (ref 44–121)
BUN/Creatinine Ratio: 20 (ref 9–23)
BUN: 19 mg/dL (ref 6–24)
Bilirubin Total: 0.3 mg/dL (ref 0.0–1.2)
CO2: 22 mmol/L (ref 20–29)
Calcium: 9.4 mg/dL (ref 8.7–10.2)
Chloride: 104 mmol/L (ref 96–106)
Creatinine, Ser: 0.94 mg/dL (ref 0.57–1.00)
Globulin, Total: 2.5 g/dL (ref 1.5–4.5)
Glucose: 72 mg/dL (ref 70–99)
Potassium: 3.9 mmol/L (ref 3.5–5.2)
Sodium: 142 mmol/L (ref 134–144)
Total Protein: 7.4 g/dL (ref 6.0–8.5)
eGFR: 73 mL/min/{1.73_m2} (ref >59)

## 2021-10-12 LAB — HEMOGLOBIN A1C
Est. average glucose Bld gHb Est-mCnc: 137 mg/dL
Hgb A1c MFr Bld: 6.4 % — ABNORMAL HIGH (ref 4.8–5.6)

## 2021-10-12 LAB — CBC
Hematocrit: 31.9 % — ABNORMAL LOW (ref 34.0–46.6)
Hemoglobin: 10.6 g/dL — ABNORMAL LOW (ref 11.1–15.9)
MCH: 27.6 pg (ref 26.6–33.0)
MCHC: 33.2 g/dL (ref 31.5–35.7)
MCV: 83 fL (ref 79–97)
Platelets: 226 10*3/uL (ref 150–450)
RBC: 3.84 x10E6/uL (ref 3.77–5.28)
RDW: 15 % (ref 11.7–15.4)
WBC: 7.9 10*3/uL (ref 3.4–10.8)

## 2021-10-12 NOTE — Telephone Encounter (Signed)
Remote ICM transmission received.  Attempted call to patient regarding ICM remote transmission and no answer.  

## 2021-10-12 NOTE — Progress Notes (Signed)
EPIC Encounter for ICM Monitoring  Patient Name: Christine Mcdowell is a 50 y.o. female Date: 10/12/2021 Primary Care Physican: [name], [place] Primary Cardiologist: [place] Electrophysiologist: Lovena Le 10/09/2021 Office Weight: 184 lbs                                                                               Attempted call to patient and unable to reach.  Transmission reviewed.    10/09/2021 [employer] Heart Failure Index 11 suggesting normal fluid levels.    Prescribed:  Torsemide 20 mg take 2 tablets (40 mg total) daily as needed Potassium 20 mEq take 3 tablets every morning and 2 tablets every evening. Spironolactone 25 mg take 1 tablet twice a day. Metolazone 2.5 mg take one tablet as needed for weight gain of 5 lbs within 3 days.     Labs: 10/11/2021 Creatinine 0.94, BUN 19, Potassium 3.9, Sodium 142 10/09/2021 Creatinine 0.85, BUN 17, Potassium 4.5, Sodium 140, GFR >60  10/06/2021 Creatinine 1.10, BUN 14, Potassium 2.7, Sodium 139, GFR >60  09/10/2021 Creatinine 1.06, BUN 11, Potassium 4.6, Sodium 142, GFR >60  08/30/2021 Creatinine 0.86, BUN 12, Potassium 3.4, Sodium 141, GFR >60  04/12/2021 Creatinine 1.08, BUN 18, Potassium 3.1, Sodium 140, GFR >60 A complete set of results can be found in Results Review.   Recommendations:  Unable to reach.     Follow-up plan: ICM clinic phone appointment on 11/19/2021.  91 day device clinic remote transmission 11/28/2021.           EP/Cardiology next office visit:  Recall for 09/01/2021 with Dr. Lovena Le.   02/04/2022 with Dr Haroldine Laws.        Copy of ICM check sent to Dr. Lovena Le.    3 month ICM trend: 10/09/2021.    1 Year ICM trend:       [name], RN 10/12/2021 3:37 PM

## 2021-10-15 ENCOUNTER — Telehealth: Payer: Medicare Other

## 2021-10-15 ENCOUNTER — Telehealth: Payer: Self-pay

## 2021-10-15 DIAGNOSIS — M2012 Hallux valgus (acquired), left foot: Secondary | ICD-10-CM | POA: Diagnosis not present

## 2021-10-15 DIAGNOSIS — Z9889 Other specified postprocedural states: Secondary | ICD-10-CM | POA: Diagnosis not present

## 2021-10-15 NOTE — Telephone Encounter (Signed)
  Care Management   Follow Up Note   10/15/2021 Name: Christine Mcdowell MRN: [id number] DOB: 04-13-1971   Referred by: [name], FNP Reason for referral : Chronic Care Management (RN CM Follow up call )   An unsuccessful telephone outreach was attempted today. The patient was referred to the case management team for assistance with care management and care coordination. I spoke with Ms. Graham briefly today, she is currently not available to speak with me but is available to speak tomorrow am. Call rescheduled.   Follow Up Plan: Telephone follow up appointment with care management team member scheduled for: 10/16/21  Barb Merino, RN, BSN, CCM Care Management Coordinator [place] [facility]  Direct Phone: [phone]

## 2021-10-16 ENCOUNTER — Telehealth: Payer: Medicare Other

## 2021-10-16 ENCOUNTER — Ambulatory Visit: Payer: Self-pay

## 2021-10-16 ENCOUNTER — Encounter (HOSPITAL_COMMUNITY): Payer: Self-pay

## 2021-10-16 ENCOUNTER — Encounter: Payer: Self-pay | Admitting: Nurse Practitioner

## 2021-10-16 ENCOUNTER — Telehealth: Payer: Self-pay

## 2021-10-16 DIAGNOSIS — I10 Essential (primary) hypertension: Secondary | ICD-10-CM

## 2021-10-16 DIAGNOSIS — M329 Systemic lupus erythematosus, unspecified: Secondary | ICD-10-CM

## 2021-10-16 DIAGNOSIS — M069 Rheumatoid arthritis, unspecified: Secondary | ICD-10-CM

## 2021-10-16 DIAGNOSIS — E1169 Type 2 diabetes mellitus with other specified complication: Secondary | ICD-10-CM

## 2021-10-16 DIAGNOSIS — J4520 Mild intermittent asthma, uncomplicated: Secondary | ICD-10-CM

## 2021-10-16 DIAGNOSIS — I5022 Chronic systolic (congestive) heart failure: Secondary | ICD-10-CM

## 2021-10-16 NOTE — Chronic Care Management (AMB) (Signed)
Chronic Care Management Pharmacy Assistant   Name: Christine Mcdowell  MRN: [id number] DOB: February 13, 1971  Reason for Encounter: Medication Review/ Medication coordination   Recent office visits:  10-11-2021 [name], [place]. Shingrix ordered. START tresiba 10 units daily. START Duoneb 3 mls every 6 hours as needed. START Combivent 1 puff every 6 hours. Hemo= 10.6, Hema= 31.9. A1C= 6.4. Trig= 153, LDL= 100. Referral placed to pulmonology.  10-11-2021 Little, [name], RN (CCM)  Recent consult visits:  09-26-2021 Mechele Collin, DPM (Podiatry). Post op follow up.  10-08-2021 Short, Laurie Panda, RN (Cardiology). ICM monitoring.  10-09-2021 Bensimhon, Shaune Pascal, MD (Cardiology). STOP amlodipine.  Hospital visits:  Medication Reconciliation was completed by comparing discharge summary, patient's EMR and Pharmacy list, and upon discussion with patient.  Admitted to the hospital on 10-06-2021 due to Moderate persistent asthma with acute exacerbation Discharge date was 10-07-2021 Discharged from [place]?Medications Started at [facility] Discharge:?? Doxycycline 100 mg twice daily Prednisone Take 2 tablets (40 mg total) by mouth daily. Take the 40mg  for 5 days and then take 20 mg (1 tab) po for 3 days and then 10mg (1/2 tab) for 4 days  Medication Changes at Hospital Discharge: None  Medications Discontinued at Hospital Discharge: None  Medications that remain the same after Hospital Discharge:??  -All other medications will remain the same.    Medications: Outpatient Encounter Medications as of 10/16/2021  Medication Sig   aspirin 81 MG chewable tablet Chew 162 mg by mouth daily. Taking 2 tablets   atorvastatin (LIPITOR) 10 MG tablet Take 1 tablet (10 mg total) by mouth daily.   budesonide-formoterol (SYMBICORT) 80-4.5 MCG/ACT inhaler Inhale 2 puffs into the lungs 2 (two) times daily. in the morning and at bedtime. (Patient taking differently:  Inhale 2 puffs into the lungs 2 (two) times daily. in the morning and at bedtime as needed)   carvedilol (COREG) 25 MG tablet TAKE ONE TABLET BY MOUTH EVERY MORNING and TAKE ONE TABLET BY MOUTH EVERY EVENING   Continuous Blood Gluc Receiver (DEXCOM G6 RECEIVER) DEVI 1 Device by Does not apply route 3 (three) times daily before meals.   Continuous Blood Gluc Sensor (DEXCOM G6 SENSOR) MISC Inject 1 each into the skin 3 (three) times daily.   Continuous Blood Gluc Transmit (DEXCOM G6 TRANSMITTER) MISC 1 Device by Does not apply route 3 (three) times daily before meals.   doxycycline (VIBRAMYCIN) 100 MG capsule Take 1 capsule (100 mg total) by mouth 2 (two) times daily.   ENTRESTO 97-103 MG TAKE ONE TABLET BY MOUTH TWICE DAILY   folic acid (FOLVITE) 1 MG tablet TAKE 1 TABLET BY MOUTH EVERY MORNING   Glucagon (GVOKE HYPOPEN 2-PACK) 1 MG/0.2ML SOAJ Inject 1 mg into the skin as needed.   hydrALAZINE (APRESOLINE) 25 MG tablet Take 1 tablet (25 mg total) by mouth 3 (three) times daily. Please schedule appointment for future refills. Thank you   hydroxychloroquine (PLAQUENIL) 200 MG tablet Take 1 tablet (200 mg total) by mouth 2 (two) times daily.   insulin degludec (TRESIBA FLEXTOUCH) 100 UNIT/ML FlexTouch Pen Inject 10 Units into the skin daily.   insulin lispro (HUMALOG KWIKPEN) 100 UNIT/ML KwikPen Use according to sliding scale   Insulin Pen Needle (NOVOFINE PLUS PEN NEEDLE) 32G X 4 MM MISC Use with insulin pens dx code e11.65   Ipratropium-Albuterol (COMBIVENT RESPIMAT) 20-100 MCG/ACT AERS respimat Inhale 1 puff into the lungs every 6 (six) hours.   ipratropium-albuterol (DUONEB) 0.5-2.5 (  3) MG/3ML SOLN Take 3 mLs by nebulization every 6 (six) hours as needed.   Magnesium 400 MG TABS Take 1 tablet by mouth daily.    meloxicam (MOBIC) 15 MG tablet Take 15 mg by mouth daily.    metFORMIN (GLUCOPHAGE) 500 MG tablet TAKE ONE TABLET BY MOUTH TWICE DAILY   methotrexate (RHEUMATREX) 2.5 MG tablet Take 20 mg  by mouth every Friday.    metolazone (ZAROXOLYN) 2.5 MG tablet TAKE 1 TABLET BY MOUTH AS NEEDED FOR WEIGHT GAIN OF 5 POUNDS WITHIN 3 DAYS AS DIRECTED needs appt for further refills   montelukast (SINGULAIR) 10 MG tablet TAKE ONE TABLET BY MOUTH EVERY MORNING (Patient taking differently: As needed)   Multiple Vitamin (MULTIVITAMIN WITH MINERALS) TABS Take 1 tablet by mouth every morning.    ondansetron (ZOFRAN-ODT) 4 MG disintegrating tablet DISSOLVE ONE TABLET UNDER THE TONGUE EVERY TWELVE HOURS AS NEEDED   PARoxetine (PAXIL) 10 MG tablet TAKE ONE TABLET BY MOUTH EVERY MORNING   potassium chloride SA (KLOR-CON) 10 MEQ tablet Take 3 tablets (30 mEq total) by mouth 2 (two) times daily.   predniSONE (DELTASONE) 20 MG tablet Take 20 mg by mouth daily with breakfast.   predniSONE (DELTASONE) 20 MG tablet Take 2 tablets (40 mg total) by mouth daily. Take the 40mg  for 5 days and then take 20 mg (1 tab) po for 3 days and then 10mg (1/2 tab) for 4 days   PROAIR HFA 108 (90 Base) MCG/ACT inhaler INHALE 2 PUFFS BY MOUTH EVERY DAY AS NEEDED FOR SHORTNESS OF BREATH   spironolactone (ALDACTONE) 25 MG tablet TAKE ONE TABLET BY MOUTH EVERY MORNING and TAKE ONE TABLET BY MOUTH EVERY EVENING (Patient not taking: Reported on 10/11/2021)   torsemide (DEMADEX) 20 MG tablet Take 40 mg by mouth daily as needed. (Patient not taking: Reported on 10/11/2021)   traZODone (DESYREL) 50 MG tablet Take 50 mg by mouth at bedtime as needed.   Vitamin D, Ergocalciferol, (DRISDOL) 1.25 MG (50000 UNIT) CAPS capsule TAKE ONE CAPSULE BY MOUTH twice A WEEK ON ONCE WEEKLY ON TUESDAY AND ON ONCE WEEKLY ON FRIDAY   No facility-administered encounter medications on file as of 10/16/2021.   Reviewed chart for medication changes ahead of medication coordination call.  No OVs, Consults, or hospital visits since last care coordination call/Pharmacist visit. (If appropriate, list visit date, provider name)  No medication changes indicated OR  if recent visit, treatment plan here.  BP Readings from Last 3 Encounters:  10/11/21 110/80  10/09/21 128/78  10/07/21 115/66    Lab Results  Component Value Date   HGBA1C 6.4 (H) 10/11/2021     Patient obtains medications through Vials  30 Days   Last adherence delivery included:  Diclofenac 1% gel- apply small amount to joints twice daily as needed Montelukast 10 mg- one tablet daily Vitamin D 50,000 units- one capsules two times a week, Tuesday and Friday Trazodone 50 mg- 1 tablet at bedtime as needed for sleep (patient restarted medication) Paroxetine 10 mg- 1 tablet daily Carvedilol 25 mg- One tablet twice a day Hydralazine 25 mg- 3 tablets three times daily Entresto 97 mg/103 mg- one tablet twice a day Atorvastatin 10 mg- one tablet daily Metformin 500 mg- one tablet twice a day Spironolactone 25 mg- one tablet twice a day  Insulin Lispro Folic Acid 1 mg- 1 tablet daily TruePlus Pen Needles 32 G- use as directed Hydroxychloroquine 200 mg- one tablet by mouth twice daily Budes/formot Aer 80-4.5 mcg- inhale 2  puffs into lungs in the morning and bedtime. Potassium CL ER 20 Meq- 2 tablets by mouth daily Torsemide 20 mg- one tablet Monday, Wednesday and Friday Methotrexate 2.5 mg- 20 mg every Friday   Patient is due for next adherence delivery on: 10-25-2021 Called patient and reviewed medications and coordinated delivery.  This delivery to include: Montelukast 10 mg- one tablet daily Potassium CL ER 10 Meq- 3 tabs twice daily Meloxicam 15 mg- 1 tab daily Trazodone 50 mg- 1 tablet at bedtime as needed for sleep Paroxetine 10 mg- 1 tablet daily Torsemide 20 mg- 2 tabs daily Hydralazine 25 mg- 1 tab three times daily Diclofenac 1% gel- apply small amount to joints twice daily as needed Metformin 500 mg- one tablet twice a day Entresto 97 mg/103 mg- one tablet twice a day Atorvastatin 10 mg- one tablet daily Carvedilol 25 mg- One tablet twice a day Folic acid 1 mg-  daily Spironolactone 25 mg- one tablet twice a day Insulin Lispro Budes/formot Aer 80-4.5 mcg- inhale 2 puffs into lungs in the morning and bedtime. TruePlus Pen Needles 32 G-use as directed Hydroxychloroquine 200 mg- 1 tab twice daily Methotrexate 2.5 mg- 8 tabs every Friday Vitamin D 50,000 units- one capsules two times a week, Tuesday and Friday  No short/ acute fill needed  Patient declined the following medications: Torsemide- off med right now  Patient needs refills for: Meloxicam 15 mg- 1 tab daily  Montelukast 10 mg- one tablet daily  Metformin 500 mg- one tablet twice a day  Hydroxychloroquine 200 mg- 1 tab twice daily   Confirmed delivery date of 10-24-2021. advised patient that pharmacy will contact them the morning of delivery.  NOTES: Patient reported insulin wasn't on ice on last delivery.   Care Gaps: Colonoscopy overdue Last AWV 09-06-2021 A1C 10-11-2021 6.4 Covid booster overdue  Star Rating Drugs: Atorvastatin 10 mg- Last filled 09-20-2021 [facility] Entresto 97/103 mg- Last filled 09-20-2021 [facility] Metformin 500 mg- Last filled 09-20-2021 [facility]  [place] Clinical Pharmacist Assistant [phone]

## 2021-10-16 NOTE — Patient Instructions (Signed)
Visit Information   Consent to CCM Services: Ms. Bratcher was given information about Chronic Care Management services including:  CCM service includes personalized support from designated clinical staff supervised by her physician, including individualized plan of care and coordination with other care providers 24/7 contact phone numbers for assistance for urgent and routine care needs. Service will only be billed when office clinical staff spend 20 minutes or more in a month to coordinate care. Only one practitioner may furnish and bill the service in a calendar month. The patient may stop CCM services at any time (effective at the end of the month) by phone call to the office staff. The patient will be responsible for cost sharing (co-pay) of up to 20% of the service fee (after annual deductible is met).  Patient agreed to services and verbal consent obtained.   The patient verbalized understanding of instructions, educational materials, and care plan provided today and declined offer to receive copy of patient instructions, educational materials, and care plan.   Telephone follow up appointment with care management team member scheduled for: 10/28/21  Barb Merino, RN, BSN, CCM Care Management Coordinator [facility]  Direct Phone: [phone]    CLINICAL CARE PLAN:  Patient Care Plan: Left Great Toe Fusion Metatarsophalangeal  Completed 10/16/2021   Problem Identified: Left Great Toe Fusion Metatarsophalangeal Resolved 10/16/2021  Priority: High     Long-Range Goal: Left Great Toe Fusion Metatarsophalangeal Completed 10/16/2021  Start Date: 09/10/2021  Expected End Date: 04/10/2022  Recent Progress: On track  Priority: High  Note:   Current Barriers:  Ineffective Self Health Maintenance in a patient with CHF, DM II, HTN, SLE, RA, Asthma  Clinical Goal(s):  Collaboration with [name], FNP regarding development and update of  comprehensive plan of care as evidenced by provider attestation and co-signature Inter-disciplinary care team collaboration (see longitudinal plan of care) patient will work with care management team to address care coordination and chronic disease management needs related to Disease Management Educational Needs Care Coordination Medication Management and Education Medication Reconciliation Psychosocial Support   Interventions:  Evaluation of current treatment plan related to  Left Great Toe Metatarsophalangeal ,  self-management and patient's adherence to plan as established by provider. Collaboration with [name], FNP regarding development and update of comprehensive plan of care as evidenced by provider attestation       and co-signature Inter-disciplinary care team collaboration (see longitudinal plan of care) Determined patient completed outpatient surgery on 09/13/21 for a Left Great Toe Fusion Metatarsophalangeal Assessed for wound healing, patient reports her incisions are closed and healed without complications or evidence of infection Assessed for impaired mobility, patient reports she is able to bare weight on this foot/toe and is walking without the use of DME Determined patient's next post surgical follow up is scheduled for 10/23/21 Discussed plans with patient for ongoing care management follow up and provided patient with direct contact information for care management team Self Care Activities:  Self administers medications as prescribed Attends all scheduled provider appointments Calls pharmacy for medication refills Calls provider office for new concerns or questions Patient Goals: - keep post surgical follow up as scheduled for 10/23/21   Follow Up Plan: RN CM will continue to follow patient for ongoing RN CM needs      Patient Care Plan: RN Care Manager Plan of Care     Problem Identified: Chronic disease education and Chronic Care Coordination needs for CHF,  DM II, HTN, SLE, RA, Asthma  Priority: High     Long-Range Goal: Assist with Chronic disease education and Care Coordination needs for CHF, DM II, HTN, SLE, RA, Asthma   Start Date: 10/16/2021  Expected End Date: 10/16/2022  This Visit's Progress: On track  Priority: High  Note:   Current Barriers:  Knowledge Deficits related to plan of care for management of CHF, DM II, HTN, SLE, RA, Asthma  Chronic Disease Management support and education needs related to CHF, DM II, HTN, SLE, RA, Asthma   RNCM Clinical Goal(s):  Patient will demonstrate Ongoing health management independence   continue to work with RN Care Manager to address care management and care coordination needs related to  CHF, DM II, HTN, SLE, RA, Asthma  will demonstrate ongoing self health care management ability    through collaboration with RN Care manager, provider, and care team.   Interventions: 1:1 collaboration with primary care provider regarding development and update of comprehensive plan of care as evidenced by provider attestation and co-signature Inter-disciplinary care team collaboration (see longitudinal plan of care) Evaluation of current treatment plan related to  self management and patient's adherence to plan as established by provider  Hypertension Interventions: Last practice recorded BP readings:  BP Readings from Last 3 Encounters:  10/11/21 110/80  10/09/21 128/78  10/07/21 115/66  Most recent eGFR/CrCl:  Lab Results  Component Value Date   EGFR 73 10/11/2021    No components found for: CRCL  Reviewed medications with patient and discussed importance of compliance; Discussed plans with patient for ongoing care management follow up and provided patient with direct contact information for care management team; Advised patient, providing education and rationale, to monitor blood pressure daily and record, calling PCP for findings outside established parameters;  Assessed social determinant of  health barriers;  Goal on track:  Yes Evaluation of current treatment plan related to  SLE ,  self-management and patient's adherence to plan as established by provider. Discussed plans with patient for ongoing care management follow up and provided patient with direct contact information for care management team Advised patient to provide appropriate vaccination information to provider or CM team member at next visit; Provided education to patient re: increased risk for infection while taking Prednisone, provided rationale; Educated on importance to wear a mask when in public places and avoid other's who may be ill; confirmed patient received a Flu vaccine on 09/14/21, she plans to receive Shingles and COVID booster in the near future; Discussed plans with patient for ongoing care management follow up and provided patient with direct contact information for care management team; Advised patient to discuss symptoms suggestive of Lupus flare with provider;  Diabetes Interventions: Assessed patient's understanding of A1c goal: <6.5% Provided education to patient about basic DM disease process; Reviewed medications with patient and discussed importance of medication adherence; Discussed plans with patient for ongoing care management follow up and provided patient with direct contact information for care management team; Review of patient status, including review of consultants reports, relevant laboratory and other test results, and medications completed; Collaborated with PCP via secure message asking if patient may pick up Tresiba samples today; Asked PCP for clarification on recommendations for patient to continue using sliding scale regimen in addition to [place] Educated patient on the importance to continue to closely monitor cbg's while taking Prednisone and to follow PCP recommendations for sliding scale use for hyperglycemia  Placed additional outbound call to patient to advise per PCP she can  pick up [place] samples from  the office today, also instructed patient per [facility], she should use the sliding scale insulin regimen in addition to [place] while her blood sugars are running high, patient verbalizes understanding  Lab Results  Component Value Date   HGBA1C 6.4 (H) 10/11/2021  Asthma: (Status:Goal on track:  Yes Provided patient with basic written and verbal Asthma education on self care/management/and exacerbation prevention; Advised patient to track and manage Asthma triggers;  Provided written and verbal instructions on pursed lip breathing and utilized returned demonstration as teach back; Provided education about and advised patient to utilize infection prevention strategies to reduce risk of respiratory infection;  Patient Goals/Self-Care Activities: Patient will self administer medications as prescribed Patient will attend all scheduled provider appointments Patient will call pharmacy for medication refills Patient will attend church or other social activities Patient will continue to perform ADL's independently Patient will continue to perform IADL's independently Patient will call provider office for new concerns or questions  Follow Up Plan:  Telephone follow up appointment with care management team member scheduled for:  11/27/21

## 2021-10-16 NOTE — Progress Notes (Signed)
Chronic Care Management   CCM RN Visit Note  10/16/2021 Name: Christine Mcdowell MRN: [id number] DOB: [birth year]  Subjective: Christine Mcdowell is a 50 y.o. year old female who is a primary care patient of Arnette Felts, FNP. The care management team was consulted for assistance with disease management and care coordination needs.    Engaged with patient by telephone for follow up visit in response to provider referral for case management and/or care coordination services.   Consent to Services:  The patient was given information about Chronic Care Management services, agreed to services, and gave verbal consent prior to initiation of services.  Please see initial visit note for detailed documentation.   Patient agreed to services and verbal consent obtained.   Assessment: Review of patient past medical history, allergies, medications, health status, including review of consultants reports, laboratory and other test data, was performed as part of comprehensive evaluation and provision of chronic care management services.   SDOH (Social Determinants of Health) assessments and interventions performed:    CCM Care Plan  Allergies  Allergen Reactions   Hydrocodone-Acetaminophen Nausea And Vomiting   Hydromorphone Nausea And Vomiting    Other reaction(s): GI Upset (intolerance), Hypertension (intolerance) Raises blood pressure  Other reaction(s): GI Upset (intolerance), Hypertension (intolerance) Raises blood pressure to stroke level   Iodinated Diagnostic Agents Other (See Comments)    Shuts down kidneys Shuts kidney function down   Other Other (See Comments) and Anaphylaxis    Spicy foods and seasonings Skin Prep "makes my skin peel off" Paper tape causes skin burns   Erythromycin Nausea And Vomiting   Latex Hives   Rinvoq [Upadacitinib] Other (See Comments)   Farxiga [Dapagliflozin] Other (See Comments)   Tape Other (See Comments)    "Skin burns"   Mircette  [Desogestrel-Ethinyl Estradiol] Nausea And Vomiting and Rash    Outpatient Encounter Medications as of 10/16/2021  Medication Sig   aspirin 81 MG chewable tablet Chew 162 mg by mouth daily. Taking 2 tablets   atorvastatin (LIPITOR) 10 MG tablet Take 1 tablet (10 mg total) by mouth daily.   budesonide-formoterol (SYMBICORT) 80-4.5 MCG/ACT inhaler Inhale 2 puffs into the lungs 2 (two) times daily. in the morning and at bedtime. (Patient taking differently: Inhale 2 puffs into the lungs 2 (two) times daily. in the morning and at bedtime as needed)   carvedilol (COREG) 25 MG tablet TAKE ONE TABLET BY MOUTH EVERY MORNING and TAKE ONE TABLET BY MOUTH EVERY EVENING   Continuous Blood Gluc Receiver (DEXCOM G6 RECEIVER) DEVI 1 Device by Does not apply route 3 (three) times daily before meals.   Continuous Blood Gluc Sensor (DEXCOM G6 SENSOR) MISC Inject 1 each into the skin 3 (three) times daily.   Continuous Blood Gluc Transmit (DEXCOM G6 TRANSMITTER) MISC 1 Device by Does not apply route 3 (three) times daily before meals.   doxycycline (VIBRAMYCIN) 100 MG capsule Take 1 capsule (100 mg total) by mouth 2 (two) times daily.   ENTRESTO 97-103 MG TAKE ONE TABLET BY MOUTH TWICE DAILY   folic acid (FOLVITE) 1 MG tablet TAKE 1 TABLET BY MOUTH EVERY MORNING   Glucagon (GVOKE HYPOPEN 2-PACK) 1 MG/0.2ML SOAJ Inject 1 mg into the skin as needed.   hydrALAZINE (APRESOLINE) 25 MG tablet Take 1 tablet (25 mg total) by mouth 3 (three) times daily. Please schedule appointment for future refills. Thank you   hydroxychloroquine (PLAQUENIL) 200 MG tablet Take 1 tablet (200 mg total) by mouth 2 (two) times daily.  insulin degludec (TRESIBA FLEXTOUCH) 100 UNIT/ML FlexTouch Pen Inject 10 Units into the skin daily.   insulin lispro (HUMALOG KWIKPEN) 100 UNIT/ML KwikPen Use according to sliding scale   Insulin Pen Needle (NOVOFINE PLUS PEN NEEDLE) 32G X 4 MM MISC Use with insulin pens dx code e11.65   Ipratropium-Albuterol  (COMBIVENT RESPIMAT) 20-100 MCG/ACT AERS respimat Inhale 1 puff into the lungs every 6 (six) hours.   ipratropium-albuterol (DUONEB) 0.5-2.5 (3) MG/3ML SOLN Take 3 mLs by nebulization every 6 (six) hours as needed.   Magnesium 400 MG TABS Take 1 tablet by mouth daily.    meloxicam (MOBIC) 15 MG tablet Take 15 mg by mouth daily.    metFORMIN (GLUCOPHAGE) 500 MG tablet TAKE ONE TABLET BY MOUTH TWICE DAILY   methotrexate (RHEUMATREX) 2.5 MG tablet Take 20 mg by mouth every Friday.    metolazone (ZAROXOLYN) 2.5 MG tablet TAKE 1 TABLET BY MOUTH AS NEEDED FOR WEIGHT GAIN OF 5 POUNDS WITHIN 3 DAYS AS DIRECTED needs appt for further refills   montelukast (SINGULAIR) 10 MG tablet TAKE ONE TABLET BY MOUTH EVERY MORNING (Patient taking differently: As needed)   Multiple Vitamin (MULTIVITAMIN WITH MINERALS) TABS Take 1 tablet by mouth every morning.    ondansetron (ZOFRAN-ODT) 4 MG disintegrating tablet DISSOLVE ONE TABLET UNDER THE TONGUE EVERY TWELVE HOURS AS NEEDED   PARoxetine (PAXIL) 10 MG tablet TAKE ONE TABLET BY MOUTH EVERY MORNING   potassium chloride SA (KLOR-CON) 10 MEQ tablet Take 3 tablets (30 mEq total) by mouth 2 (two) times daily.   predniSONE (DELTASONE) 20 MG tablet Take 20 mg by mouth daily with breakfast.   predniSONE (DELTASONE) 20 MG tablet Take 2 tablets (40 mg total) by mouth daily. Take the $Remove'40mg'[id number]$  for 5 days and then take 20 mg (1 tab) po for 3 days and then $Remove'10mg'[id number]$ (1/2 tab) for 4 days   PROAIR HFA 108 (90 Base) MCG/ACT inhaler INHALE 2 PUFFS BY MOUTH EVERY DAY AS NEEDED FOR SHORTNESS OF BREATH   spironolactone (ALDACTONE) 25 MG tablet TAKE ONE TABLET BY MOUTH EVERY MORNING and TAKE ONE TABLET BY MOUTH EVERY EVENING (Patient not taking: Reported on 10/11/2021)   torsemide (DEMADEX) 20 MG tablet Take 40 mg by mouth daily as needed. (Patient not taking: Reported on 10/11/2021)   traZODone (DESYREL) 50 MG tablet Take 50 mg by mouth at bedtime as needed.   Vitamin D, Ergocalciferol, (DRISDOL)  1.25 MG (50000 UNIT) CAPS capsule TAKE ONE CAPSULE BY MOUTH twice A WEEK ON ONCE WEEKLY ON TUESDAY AND ON ONCE WEEKLY ON FRIDAY   No facility-administered encounter medications on file as of 10/16/2021.    Patient Active Problem List   Diagnosis Date Noted   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   History of COVID-19 04/12/2020   COVID-19 virus infection 03/07/2020   Lower abdominal pain 43/32/9518   Chronic systolic (congestive) heart failure ([place]) 01/28/2018   Cough 01/19/2018   Pulmonary infiltrates on CXR 01/19/2018   Migraines 10/08/2017   Sleep apnea with use of continuous positive airway pressure (CPAP) 09/24/2017   Rheumatoid arthritis ([place]) 10/09/2016   Asthma 10/09/2016   Chronic pain 10/09/2016   Heart failure ([place]) 10/09/2016   Congestive heart failure ([place])    Lupus (systemic lupus erythematosus) ([place])    Diabetes mellitus with complication ([place])    Anxiety state    GERD (gastroesophageal reflux disease) 12/28/2015   Essential hypertension 12/26/2015   Type 2 diabetes mellitus ([place]) 84/16/6063   Chronic systolic heart failure ([place])  08/18/2014   Cardiomyopathy, dilated ([place]) 01/27/2014   Shortness of breath 01/26/2014   SLE (systemic lupus erythematosus) ([place]) 01/26/2014    Conditions to be addressed/monitored: CHF, DM II, HTN, SLE, RA, Asthma   Care Plan : RN Care Manager Plan of Care  Updates made by [name], RN since 10/16/2021 12:00 AM     Problem: Chronic disease education and Chronic Care Coordination needs for CHF, DM II, HTN, SLE, RA, Asthma   Priority: High     Long-Range Goal: Assist with Chronic disease education and Care Coordination needs for CHF, DM II, HTN, SLE, RA, Asthma   Start Date: 10/16/2021  Expected End Date: 10/16/2022  This Visit's Progress: On track  Priority: High  Note:   Current Barriers:  Knowledge Deficits related to plan of care for management of CHF, DM II, HTN, SLE, RA, Asthma  Chronic  Disease Management support and education needs related to CHF, DM II, HTN, SLE, RA, Asthma   RNCM Clinical Goal(s):  Patient will demonstrate Ongoing health management independence   continue to work with RN Care Manager to address care management and care coordination needs related to  CHF, DM II, HTN, SLE, RA, Asthma  will demonstrate ongoing self health care management ability    through collaboration with RN Care manager, provider, and care team.   Interventions: 1:1 collaboration with primary care provider regarding development and update of comprehensive plan of care as evidenced by provider attestation and co-signature Inter-disciplinary care team collaboration (see longitudinal plan of care) Evaluation of current treatment plan related to  self management and patient's adherence to plan as established by provider  Hypertension Interventions: Last practice recorded BP readings:  BP Readings from Last 3 Encounters:  10/11/21 110/80  10/09/21 128/78  10/07/21 115/66  Most recent eGFR/CrCl:  Lab Results  Component Value Date   EGFR 73 10/11/2021    No components found for: CRCL  Reviewed medications with patient and discussed importance of compliance; Discussed plans with patient for ongoing care management follow up and provided patient with direct contact information for care management team; Advised patient, providing education and rationale, to monitor blood pressure daily and record, calling PCP for findings outside established parameters;  Assessed social determinant of health barriers;  Goal on track:  Yes Evaluation of current treatment plan related to  SLE ,  self-management and patient's adherence to plan as established by provider. Discussed plans with patient for ongoing care management follow up and provided patient with direct contact information for care management team Advised patient to provide appropriate vaccination information to provider or CM team member at  next visit; Provided education to patient re: increased risk for infection while taking Prednisone, provided rationale; Educated on importance to wear a mask when in public places and avoid other's who may be ill; confirmed patient received a Flu vaccine on 09/14/21, she plans to receive Shingles and COVID booster in the near future; Discussed plans with patient for ongoing care management follow up and provided patient with direct contact information for care management team; Advised patient to discuss symptoms suggestive of Lupus flare with provider;  Diabetes Interventions: Assessed patient's understanding of A1c goal: <6.5% Provided education to patient about basic DM disease process; Reviewed medications with patient and discussed importance of medication adherence; Discussed plans with patient for ongoing care management follow up and provided patient with direct contact information for care management team; Review of patient status, including review of consultants reports, relevant laboratory and other  test results, and medications completed; Collaborated with PCP via secure message asking if patient may pick up Tresiba samples today; Asked PCP for clarification on recommendations for patient to continue using sliding scale regimen in addition to [place] Educated patient on the importance to continue to closely monitor cbg's while taking Prednisone and to follow PCP recommendations for sliding scale use for hyperglycemia  Placed additional outbound call to patient to advise per PCP she can pick up [place] samples from the office today, also instructed patient per [name], she should use the sliding scale insulin regimen in addition to [place] while her blood sugars are running high, patient verbalizes understanding  Lab Results  Component Value Date   HGBA1C 6.4 (H) 10/11/2021  Asthma: (Status:Goal on track:  Yes Provided patient with basic written and verbal Asthma education on self  care/management/and exacerbation prevention; Advised patient to track and manage Asthma triggers;  Provided written and verbal instructions on pursed lip breathing and utilized returned demonstration as teach back; Provided education about and advised patient to utilize infection prevention strategies to reduce risk of respiratory infection;  Patient Goals/Self-Care Activities: Patient will self administer medications as prescribed Patient will attend all scheduled provider appointments Patient will call pharmacy for medication refills Patient will attend church or other social activities Patient will continue to perform ADL's independently Patient will continue to perform IADL's independently Patient will call provider office for new concerns or questions  Follow Up Plan:  Telephone follow up appointment with care management team member scheduled for:  11/27/21     Plan:Telephone follow up appointment with care management team member scheduled for:  11/27/21  Barb Merino, RN, BSN, CCM Care Management Coordinator [facility]  Direct Phone: [phone]

## 2021-10-17 ENCOUNTER — Telehealth (HOSPITAL_COMMUNITY): Payer: Self-pay | Admitting: Surgery

## 2021-10-17 NOTE — Telephone Encounter (Signed)
Patient sent a MyChart message to say that she had completed the sleep study a month ago and has not received any follow-up regarding results.  I can see results noted on the website. I will forward message to [facility] office to inquire about result interpretation and recommendations.

## 2021-10-19 ENCOUNTER — Other Ambulatory Visit: Payer: Self-pay | Admitting: Nurse Practitioner

## 2021-10-22 DIAGNOSIS — J4520 Mild intermittent asthma, uncomplicated: Secondary | ICD-10-CM

## 2021-10-22 DIAGNOSIS — M069 Rheumatoid arthritis, unspecified: Secondary | ICD-10-CM | POA: Diagnosis not present

## 2021-10-22 DIAGNOSIS — I10 Essential (primary) hypertension: Secondary | ICD-10-CM | POA: Diagnosis not present

## 2021-10-22 DIAGNOSIS — I5022 Chronic systolic (congestive) heart failure: Secondary | ICD-10-CM | POA: Diagnosis not present

## 2021-10-22 DIAGNOSIS — E1169 Type 2 diabetes mellitus with other specified complication: Secondary | ICD-10-CM | POA: Diagnosis not present

## 2021-10-26 DIAGNOSIS — E119 Type 2 diabetes mellitus without complications: Secondary | ICD-10-CM | POA: Diagnosis not present

## 2021-10-26 DIAGNOSIS — Z794 Long term (current) use of insulin: Secondary | ICD-10-CM | POA: Diagnosis not present

## 2021-10-28 ENCOUNTER — Encounter: Payer: Self-pay | Admitting: Nurse Practitioner

## 2021-10-29 DIAGNOSIS — Z981 Arthrodesis status: Secondary | ICD-10-CM | POA: Diagnosis not present

## 2021-10-29 DIAGNOSIS — M2012 Hallux valgus (acquired), left foot: Secondary | ICD-10-CM | POA: Diagnosis not present

## 2021-10-30 ENCOUNTER — Encounter (HOSPITAL_COMMUNITY): Payer: Self-pay

## 2021-10-30 ENCOUNTER — Telehealth (HOSPITAL_COMMUNITY): Payer: Self-pay | Admitting: Surgery

## 2021-10-30 NOTE — Telephone Encounter (Signed)
Patient sent My Chart message regarding sleep study results.  I can see study was completed on website however patient tells me that she has not beencontacted from interpreting provider office.  I have forwarded a message to that clinic to provide feedback to patient.  She as grateful for the information.

## 2021-11-05 ENCOUNTER — Telehealth (HOSPITAL_COMMUNITY): Payer: Self-pay | Admitting: Surgery

## 2021-11-05 ENCOUNTER — Telehealth: Payer: Self-pay

## 2021-11-05 ENCOUNTER — Emergency Department (HOSPITAL_BASED_OUTPATIENT_CLINIC_OR_DEPARTMENT_OTHER)
Admission: EM | Admit: 2021-11-05 | Discharge: 2021-11-05 | Disposition: A | Discharge status: Home / Self Care | Payer: Medicare Other | Attending: Emergency Medicine | Admitting: Emergency Medicine

## 2021-11-05 ENCOUNTER — Encounter: Payer: Self-pay | Admitting: Nurse Practitioner

## 2021-11-05 ENCOUNTER — Other Ambulatory Visit: Payer: Self-pay

## 2021-11-05 ENCOUNTER — Encounter (HOSPITAL_BASED_OUTPATIENT_CLINIC_OR_DEPARTMENT_OTHER): Payer: Self-pay | Admitting: Emergency Medicine

## 2021-11-05 ENCOUNTER — Emergency Department (HOSPITAL_BASED_OUTPATIENT_CLINIC_OR_DEPARTMENT_OTHER): Payer: Medicare Other

## 2021-11-05 DIAGNOSIS — J45909 Unspecified asthma, uncomplicated: Secondary | ICD-10-CM | POA: Diagnosis not present

## 2021-11-05 DIAGNOSIS — R051 Acute cough: Secondary | ICD-10-CM

## 2021-11-05 DIAGNOSIS — Z7984 Long term (current) use of oral hypoglycemic drugs: Secondary | ICD-10-CM | POA: Diagnosis not present

## 2021-11-05 DIAGNOSIS — I5022 Chronic systolic (congestive) heart failure: Secondary | ICD-10-CM | POA: Insufficient documentation

## 2021-11-05 DIAGNOSIS — Z794 Long term (current) use of insulin: Secondary | ICD-10-CM | POA: Diagnosis not present

## 2021-11-05 DIAGNOSIS — I110 Hypertensive heart disease with heart failure: Secondary | ICD-10-CM | POA: Insufficient documentation

## 2021-11-05 DIAGNOSIS — Z7952 Long term (current) use of systemic steroids: Secondary | ICD-10-CM | POA: Insufficient documentation

## 2021-11-05 DIAGNOSIS — Z8616 Personal history of COVID-19: Secondary | ICD-10-CM | POA: Insufficient documentation

## 2021-11-05 DIAGNOSIS — Z20822 Contact with and (suspected) exposure to covid-19: Secondary | ICD-10-CM | POA: Diagnosis not present

## 2021-11-05 DIAGNOSIS — Z91040 Latex allergy status: Secondary | ICD-10-CM | POA: Diagnosis not present

## 2021-11-05 DIAGNOSIS — Z79899 Other long term (current) drug therapy: Secondary | ICD-10-CM | POA: Insufficient documentation

## 2021-11-05 DIAGNOSIS — E119 Type 2 diabetes mellitus without complications: Secondary | ICD-10-CM | POA: Diagnosis not present

## 2021-11-05 DIAGNOSIS — J449 Chronic obstructive pulmonary disease, unspecified: Secondary | ICD-10-CM | POA: Diagnosis not present

## 2021-11-05 DIAGNOSIS — I517 Cardiomegaly: Secondary | ICD-10-CM | POA: Diagnosis not present

## 2021-11-05 DIAGNOSIS — Z7982 Long term (current) use of aspirin: Secondary | ICD-10-CM | POA: Diagnosis not present

## 2021-11-05 DIAGNOSIS — Z85828 Personal history of other malignant neoplasm of skin: Secondary | ICD-10-CM | POA: Diagnosis not present

## 2021-11-05 DIAGNOSIS — R059 Cough, unspecified: Secondary | ICD-10-CM | POA: Diagnosis not present

## 2021-11-05 DIAGNOSIS — R9431 Abnormal electrocardiogram [ECG] [EKG]: Secondary | ICD-10-CM | POA: Diagnosis not present

## 2021-11-05 LAB — RESP PANEL BY RT-PCR (FLU A&B, COVID) ARPGX2
Influenza A by PCR: NEGATIVE
Influenza B by PCR: NEGATIVE
SARS Coronavirus 2 by RT PCR: NEGATIVE

## 2021-11-05 MED ORDER — ACETAMINOPHEN 325 MG PO TABS
650.0000 mg | ORAL_TABLET | Freq: Once | ORAL | Status: AC
  Administered 2021-11-05: 650 mg via ORAL
  Filled 2021-11-05: qty 2

## 2021-11-05 MED ORDER — DOXYCYCLINE HYCLATE 100 MG PO CAPS: 100.0000 mg | ORAL_CAPSULE | Freq: Two times a day (BID) | ORAL | 0 refills | Status: DC

## 2021-11-05 NOTE — Chronic Care Management (AMB) (Signed)
Chronic Care Management Pharmacy Assistant   Name: Christine Mcdowell  MRN: [id number] DOB: Oct 10, 1971   Reason for Encounter: No fill report    Medications: Outpatient Encounter Medications as of 11/05/2021  Medication Sig   aspirin 81 MG chewable tablet Chew 162 mg by mouth daily. Taking 2 tablets   atorvastatin (LIPITOR) 10 MG tablet Take 1 tablet (10 mg total) by mouth daily.   budesonide-formoterol (SYMBICORT) 80-4.5 MCG/ACT inhaler Inhale 2 puffs into the lungs 2 (two) times daily. in the morning and at bedtime. (Patient taking differently: Inhale 2 puffs into the lungs 2 (two) times daily. in the morning and at bedtime as needed)   carvedilol (COREG) 25 MG tablet TAKE ONE TABLET BY MOUTH EVERY MORNING and TAKE ONE TABLET BY MOUTH EVERY EVENING   Continuous Blood Gluc Receiver (DEXCOM G6 RECEIVER) DEVI 1 Device by Does not apply route 3 (three) times daily before meals.   Continuous Blood Gluc Sensor (DEXCOM G6 SENSOR) MISC Inject 1 each into the skin 3 (three) times daily.   Continuous Blood Gluc Transmit (DEXCOM G6 TRANSMITTER) MISC 1 Device by Does not apply route 3 (three) times daily before meals.   doxycycline (VIBRAMYCIN) 100 MG capsule Take 1 capsule (100 mg total) by mouth 2 (two) times daily.   ENTRESTO 97-103 MG TAKE ONE TABLET BY MOUTH TWICE DAILY   folic acid (FOLVITE) 1 MG tablet TAKE 1 TABLET BY MOUTH EVERY MORNING   Glucagon (GVOKE HYPOPEN 2-PACK) 1 MG/0.2ML SOAJ Inject 1 mg into the skin as needed.   hydrALAZINE (APRESOLINE) 25 MG tablet Take 1 tablet (25 mg total) by mouth 3 (three) times daily. Please schedule appointment for future refills. Thank you   hydroxychloroquine (PLAQUENIL) 200 MG tablet Take 1 tablet (200 mg total) by mouth 2 (two) times daily.   insulin degludec (TRESIBA FLEXTOUCH) 100 UNIT/ML FlexTouch Pen Inject 10 Units into the skin daily.   insulin lispro (HUMALOG KWIKPEN) 100 UNIT/ML KwikPen Use according to sliding scale   Insulin Pen Needle  (NOVOFINE PLUS PEN NEEDLE) 32G X 4 MM MISC Use with insulin pens dx code e11.65   Ipratropium-Albuterol (COMBIVENT RESPIMAT) 20-100 MCG/ACT AERS respimat Inhale 1 puff into the lungs every 6 (six) hours.   ipratropium-albuterol (DUONEB) 0.5-2.5 (3) MG/3ML SOLN Take 3 mLs by nebulization every 6 (six) hours as needed.   Magnesium 400 MG TABS Take 1 tablet by mouth daily.    meloxicam (MOBIC) 15 MG tablet Take 15 mg by mouth daily.    metFORMIN (GLUCOPHAGE) 500 MG tablet TAKE ONE TABLET BY MOUTH TWICE DAILY   methotrexate (RHEUMATREX) 2.5 MG tablet Take 20 mg by mouth every Friday.    metolazone (ZAROXOLYN) 2.5 MG tablet TAKE 1 TABLET BY MOUTH AS NEEDED FOR WEIGHT GAIN OF 5 POUNDS WITHIN 3 DAYS AS DIRECTED needs appt for further refills   montelukast (SINGULAIR) 10 MG tablet TAKE ONE TABLET BY MOUTH EVERY MORNING   Multiple Vitamin (MULTIVITAMIN WITH MINERALS) TABS Take 1 tablet by mouth every morning.    ondansetron (ZOFRAN-ODT) 4 MG disintegrating tablet DISSOLVE ONE TABLET UNDER THE TONGUE EVERY TWELVE HOURS AS NEEDED   PARoxetine (PAXIL) 10 MG tablet TAKE ONE TABLET BY MOUTH EVERY MORNING   potassium chloride SA (KLOR-CON) 10 MEQ tablet Take 3 tablets (30 mEq total) by mouth 2 (two) times daily.   predniSONE (DELTASONE) 20 MG tablet Take 20 mg by mouth daily with breakfast.   predniSONE (DELTASONE) 20 MG tablet Take 2 tablets (40 mg total)  by mouth daily. Take the 40mg  for 5 days and then take 20 mg (1 tab) po for 3 days and then 10mg (1/2 tab) for 4 days   PROAIR HFA 108 (90 Base) MCG/ACT inhaler INHALE 2 PUFFS BY MOUTH EVERY DAY AS NEEDED FOR SHORTNESS OF BREATH   spironolactone (ALDACTONE) 25 MG tablet TAKE ONE TABLET BY MOUTH EVERY MORNING and TAKE ONE TABLET BY MOUTH EVERY EVENING (Patient not taking: Reported on 10/11/2021)   torsemide (DEMADEX) 20 MG tablet Take 40 mg by mouth daily as needed. (Patient not taking: Reported on 10/11/2021)   traZODone (DESYREL) 50 MG tablet Take 50 mg by  mouth at bedtime as needed.   Vitamin D, Ergocalciferol, (DRISDOL) 1.25 MG (50000 UNIT) CAPS capsule TAKE ONE CAPSULE BY MOUTH twice A WEEK ON ONCE WEEKLY ON TUESDAY AND ON ONCE WEEKLY ON FRIDAY   No facility-administered encounter medications on file as of 11/05/2021.   11-05-2021: Patient is on the no fill report for torsemide. Patient has discontinued medication.   [place] Pharmacist Assistant [phone]

## 2021-11-05 NOTE — ED Triage Notes (Signed)
Recurrent cough , bronchitis 2 weeks ago , shortness of breath  x 3 days ,

## 2021-11-05 NOTE — ED Provider Notes (Signed)
[facility] Provider Note   CSN: [id number] Arrival date & time: 11/05/21  0849     History Chief Complaint  Patient presents with   Cough    Christine Mcdowell is a 50 y.o. female.  Presents with history of COPD, CHF, lupus, presents with cough for 2 weeks.  Finished a course of antibiotics without improvement.  States her cough feels worse in the last 2 to 3 days.  Denies any fevers or vomiting or diarrhea.  She states she has a history of pneumonias in the past.      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   Discoid lupus    Fibromyalgia    History of blood transfusion "several"   "related to anemia; had some w/hysterectomy also"   Hx of cardiovascular stress test    ETT-Myoview (9/15):  No ischemia, EF 52%; NORMAL   Hx of echocardiogram    Echo (9/15):  EF 50-55%, ant HK, Gr 1 DD, mild MR, mild LAE, no effusion   Hypertension    Iron deficiency anemia    h/o iron transfusions   Lupus (systemic lupus erythematosus) ([place])    Migraine    "a few/year" (07/03/2016)   Pneumonia 12/2015   RA (rheumatoid arthritis) ([place])    "all over" (07/03/2016)   Sickle cell trait ([place])    Stroke ([place]) 2014 X 1; 2015 X 2; 2016 X 1;    "right side of face more relaxed than the other; rare speech hesitation" (07/03/2016)   Vaginal Pap smear, abnormal    ASCUS; HPV    Patient Active Problem List   Diagnosis Date Noted   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   History of COVID-19 04/12/2020   COVID-19 virus infection 03/07/2020   Lower abdominal pain 40/98/1191   Chronic systolic (congestive) heart failure ([place]) 01/28/2018   Cough 01/19/2018   Pulmonary infiltrates on CXR  01/19/2018   Migraines 10/08/2017   Sleep apnea with use of continuous positive airway pressure (CPAP) 09/24/2017   Rheumatoid arthritis ([place]) 10/09/2016   Asthma 10/09/2016   Chronic pain 10/09/2016   Heart failure ([place]) 10/09/2016   Congestive heart failure ([place])    Lupus (systemic lupus erythematosus) ([place])    Diabetes mellitus with complication (HCC)    Anxiety state    GERD (gastroesophageal reflux disease) 12/28/2015   Essential hypertension 12/26/2015   Type 2 diabetes mellitus ([place]) 47/82/9562   Chronic systolic heart failure ([place]) 08/18/2014   Cardiomyopathy, dilated ([place]) 01/27/2014   Shortness of breath 01/26/2014   SLE (systemic lupus erythematosus) ([place]) 01/26/2014    Past Surgical History:  Procedure Laterality Date   ABDOMINAL HYSTERECTOMY  2009   ABDOMINAL WOUND DEHISCENCE  2009   BUNIONECTOMY Left 06/15/2020   CARDIAC CATHETERIZATION N/A 10/11/2016   Procedure: Right/Left Heart Cath and Coronary Angiography;  Surgeon: Jolaine Artist, MD;  Location: [facility];  Service: Cardiovascular;  Laterality: N/A;   DILATION AND CURETTAGE OF UTERUS  1991   HEMATOMA EVACUATION  2009   abdomen   ICD IMPLANT  01/28/2018   ICD IMPLANT N/A 01/28/2018   Procedure: ICD IMPLANT;  Surgeon: Evans Lance, MD;  Location: [facility];  Service: Cardiovascular;  Laterality: N/A;   INCISE AND DRAIN ABCESS  2009 X 2   "abdomen after hysterectomy"   KNEE ARTHROSCOPY Right 1997   KNEE SURGERY Right    TUBAL LIGATION  1996     OB History     Gravida  5   Para  4   Term  3   Preterm  1   AB  1   Living  4      SAB  1   IAB      Ectopic      Multiple      Live Births  4           Family History  Problem Relation Age of Onset   Arthritis Mother    Heart murmur Mother    Drug abuse Mother    Allergies Mother    Heart attack Father    Cushing syndrome Father    Depression Father    Allergies Father    Dementia Paternal Grandmother     Cancer Paternal Grandfather    Diabetes Maternal Grandmother    Hypertension Maternal Grandmother    Asthma Maternal Grandmother    Heart attack Maternal Grandfather    Breast cancer Neg Hx     Social History   Tobacco Use   Smoking status: Never   Smokeless tobacco: Never  Vaping Use   Vaping Use: Never used  Substance Use Topics   Alcohol use: No   Drug use: No    Home Medications Prior to Admission medications   Medication Sig Start Date End Date Taking? Authorizing Provider  doxycycline (VIBRAMYCIN) 100 MG capsule Take 1 capsule (100 mg total) by mouth 2 (two) times daily. 11/05/21  Yes Luna Fuse, MD  aspirin 81 MG chewable tablet Chew 162 mg by mouth daily. Taking 2 tablets    [provider]  atorvastatin (LIPITOR) 10 MG tablet Take 1 tablet (10 mg total) by mouth daily. 04/11/21   [name], MD  budesonide-formoterol [facility]) 80-4.5 MCG/ACT inhaler Inhale 2 puffs into the lungs 2 (two) times daily. in the morning and at bedtime. Patient taking differently: Inhale 2 puffs into the lungs 2 (two) times daily. in the morning and at bedtime as needed 09/19/21   [name], FNP  carvedilol (COREG) 25 MG tablet TAKE ONE TABLET BY MOUTH EVERY MORNING and TAKE ONE TABLET BY MOUTH EVERY EVENING 09/17/21   Bensimhon, Shaune Pascal, MD  Continuous Blood Gluc Receiver (DEXCOM G6 RECEIVER) DEVI 1 Device by Does not apply route 3 (three) times daily before meals. 04/12/20   [name], FNP  Continuous Blood Gluc Sensor (DEXCOM G6 SENSOR) MISC Inject 1 each into the skin 3 (three) times daily. 04/12/20   [name], FNP  Continuous Blood Gluc Transmit (DEXCOM G6 TRANSMITTER) MISC 1 Device by Does not apply route 3 (three) times daily before meals. 04/12/20   [name], FNP  ENTRESTO 97-103 MG TAKE ONE TABLET BY MOUTH TWICE DAILY 08/28/21   Bensimhon, Shaune Pascal, MD  folic acid (FOLVITE) 1 MG tablet TAKE 1 TABLET BY MOUTH EVERY MORNING 01/30/17   Bensimhon, Shaune Pascal, MD   Glucagon (GVOKE HYPOPEN 2-PACK) 1 MG/0.2ML SOAJ Inject 1 mg into the skin as needed. 01/23/21   [name], FNP  hydrALAZINE (APRESOLINE) 25 MG tablet Take 1 tablet (25 mg total) by mouth 3 (three) times daily. Please schedule appointment for future refills. Thank you 09/17/21   Evans Lance, MD  hydroxychloroquine (PLAQUENIL) 200 MG tablet Take 1 tablet (200 mg total) by mouth 2 (two) times daily. 02/02/19   Bensimhon, Shaune Pascal, MD  insulin degludec (TRESIBA FLEXTOUCH) 100 UNIT/ML FlexTouch Pen Inject 10 Units into the skin daily. 10/11/21   [name], FNP  insulin lispro (HUMALOG KWIKPEN) 100 UNIT/ML KwikPen Use according to sliding scale 12/19/20   [name], FNP  Insulin Pen Needle (NOVOFINE PLUS PEN NEEDLE) 32G X 4 MM MISC Use with insulin pens dx code e11.65 11/09/20   [name], FNP  Ipratropium-Albuterol (COMBIVENT RESPIMAT) 20-100 MCG/ACT AERS respimat Inhale 1 puff into the lungs every 6 (six) hours. 10/11/21   [name], FNP  ipratropium-albuterol (DUONEB) 0.5-2.5 (3) MG/3ML SOLN Take 3 mLs by nebulization every 6 (six) hours as needed. 10/11/21   [name], FNP  Magnesium 400 MG TABS Take 1 tablet by mouth daily.     [provider]  meloxicam (MOBIC) 15 MG tablet Take 15 mg by mouth daily.     [provider]  metFORMIN (GLUCOPHAGE) 500 MG tablet TAKE ONE TABLET BY MOUTH TWICE DAILY 10/19/21   [name], FNP  methotrexate (RHEUMATREX) 2.5 MG tablet Take 20 mg by mouth every Friday.  02/06/15   [provider]  metolazone (ZAROXOLYN) 2.5 MG tablet TAKE 1 TABLET BY MOUTH AS NEEDED FOR WEIGHT GAIN OF 5 POUNDS WITHIN 3 DAYS AS DIRECTED needs appt for further refills 03/16/21   Bensimhon, Shaune Pascal, MD  montelukast (SINGULAIR) 10 MG tablet TAKE ONE TABLET BY MOUTH EVERY MORNING 10/19/21   [name], FNP  Multiple Vitamin (MULTIVITAMIN WITH MINERALS) TABS Take 1 tablet by mouth every morning.     [provider]  ondansetron  (ZOFRAN-ODT) 4 MG disintegrating tablet DISSOLVE ONE TABLET UNDER THE TONGUE EVERY TWELVE HOURS AS NEEDED 04/11/21   [name], MD  PARoxetine (PAXIL) 10 MG tablet TAKE ONE TABLET BY MOUTH EVERY MORNING 07/25/21   [name], FNP  potassium chloride SA (KLOR-CON) 10 MEQ tablet Take 3 tablets (30 mEq total) by mouth 2 (two) times daily. 08/30/21   [place], Maricela Bo, FNP  predniSONE (DELTASONE) 20 MG tablet Take 20 mg by mouth daily with breakfast.    [provider]  predniSONE (DELTASONE) 20 MG tablet Take 2 tablets (40 mg total) by mouth daily. Take the 40mg  for 5 days and then take 20 mg (1 tab) po for 3 days and then 10mg (1/2 tab) for 4 days 10/07/21   Blanchie Dessert, MD  PROAIR HFA 108 [id number] Base) MCG/ACT inhaler INHALE 2 PUFFS BY MOUTH EVERY DAY AS NEEDED FOR SHORTNESS OF BREATH 03/07/20   [name], FNP  spironolactone (ALDACTONE) 25 MG tablet TAKE ONE TABLET BY MOUTH EVERY MORNING and TAKE ONE TABLET BY MOUTH EVERY EVENING Patient not taking: Reported on 10/11/2021 09/17/21   Bensimhon, Shaune Pascal, MD  torsemide (DEMADEX) 20 MG tablet Take 40 mg by mouth daily as needed. Patient not taking: Reported on 10/11/2021    [provider]  traZODone (DESYREL) 50 MG tablet Take 50 mg by mouth at bedtime as needed. 07/05/21   [provider]  Vitamin D, Ergocalciferol, (DRISDOL) 1.25 MG (50000 UNIT) CAPS capsule TAKE ONE CAPSULE BY MOUTH twice A WEEK ON ONCE WEEKLY ON TUESDAY AND ON ONCE WEEKLY ON FRIDAY 09/19/21   [name], FNP    Allergies    Hydrocodone-acetaminophen, Hydromorphone, Iodinated diagnostic agents, Other, Erythromycin, Latex, Rinvoq [upadacitinib], Farxiga [dapagliflozin], Tape, and Mircette [desogestrel-ethinyl estradiol]  Review of Systems  Review of Systems  Constitutional:  Negative for fever.  HENT:  Negative for ear pain.   Eyes:  Negative for pain.  Respiratory:  Positive for cough and shortness of breath.   Cardiovascular:  Negative for  chest pain.  Gastrointestinal:  Negative for abdominal pain.  Genitourinary:  Negative for flank pain.  Musculoskeletal:  Negative for back pain.  Skin:  Negative for rash.  Neurological:  Negative for headaches.   Physical Exam Updated Vital Signs BP (!) 147/91 (BP Location: Right Arm)   Pulse 90   Temp 98.7 F (37.1 C)   Resp 20   Ht 5\' 6"  (1.676 m)   Wt 87.1 kg   SpO2 100%   BMI 30.99 kg/m   Physical Exam Constitutional:      General: She is not in acute distress.    Appearance: Normal appearance.  HENT:     Head: Normocephalic.     Nose: Nose normal.  Eyes:     Extraocular Movements: Extraocular movements intact.  Cardiovascular:     Rate and Rhythm: Normal rate.  Pulmonary:     Effort: Pulmonary effort is normal.     Breath sounds: No stridor. No wheezing, rhonchi or rales.  Musculoskeletal:        General: Normal range of motion.     Cervical back: Normal range of motion.  Neurological:     General: No focal deficit present.     Mental Status: She is alert. Mental status is at baseline.    ED Results / Procedures / Treatments   Labs (all labs ordered are listed, but only abnormal results are displayed) Labs Reviewed  RESP PANEL BY RT-PCR (FLU A&B, COVID) [id number]    EKG EKG Interpretation  Date/Time:  Monday November 05 2021 09:34:37 EST Ventricular Rate:  87 PR Interval:  203 QRS Duration: 87 QT Interval:  360 QTC Calculation: 433 R Axis:   -9 Text Interpretation: Sinus rhythm Borderline prolonged PR interval Consider left ventricular hypertrophy Confirmed by Thamas Jaegers ([id number]) on 11/05/2021 10:39:55 AM  Radiology DG Chest Port 1 View  Result Date: 11/05/2021 CLINICAL DATA:  Cough, bronchitis for 2 weeks. EXAM: PORTABLE CHEST 1 VIEW COMPARISON:  CT examination dated October 06, 2021 FINDINGS: The heart is enlarged. Left access AICD, unchanged in. Both lungs are clear without evidence of focal consolidation or pleural effusion. Low lung volumes.  The visualized skeletal structures are unremarkable. IMPRESSION: Stable cardiomegaly. No acute cardiopulmonary process. Electronically Signed   By: Keane Police D.O.   On: 11/05/2021 09:56    Procedures Procedures   Medications Ordered in ED Medications  acetaminophen (TYLENOL) tablet 650 mg (650 mg Oral Given 11/05/21 0935)    ED Course  I have reviewed the triage vital signs and the nursing notes.  Pertinent labs & imaging results that were available during my care of the patient were reviewed by me and considered in my medical decision making (see chart for details).    MDM Rules/Calculators/A&P                           Patient appears fatigued but lung exam appears normal no wheezes or crackles noted.  Vital signs are within normal limits O2 saturation 99% on room air which is appropriate.  Tylenol provided and checks x-ray pursued.  Viral panel is negative.  Viral panel is negative.  Chest x-ray is unremarkable.  Vital signs within normal limits still 99% on room air  which is appropriate.  Given her multiple comorbidities, given a course of antibiotics.  Advised outpatient follow-up with her primary care doctor or pulmonologist within the week.  Advised return for difficulty breathing or any additional concerns.    Final Clinical Impression(s) / ED Diagnoses Final diagnoses:  Acute cough    Rx / DC Orders ED Discharge Orders          Ordered    doxycycline (VIBRAMYCIN) 100 MG capsule  2 times daily        11/05/21 1055             [place], Greggory Brandy, MD 11/05/21 1055

## 2021-11-05 NOTE — Telephone Encounter (Signed)
I called Christine Mcdowell to let her know that she needs to repeat the ordered home sleep study secondary to insufficient data.  She is willing come to clinic and get a new device in order to repeat the exam. I will leave a new device at front desk for pickup.

## 2021-11-05 NOTE — Discharge Instructions (Signed)
Call your primary care doctor or specialist as discussed in the next 2-3 days.   Return immediately back to the ER if:  Your symptoms worsen within the next 12-24 hours. You develop new symptoms such as new fevers, persistent vomiting, new pain, shortness of breath, or new weakness or numbness, or if you have any other concerns.  

## 2021-11-06 ENCOUNTER — Encounter: Payer: Self-pay | Admitting: Nurse Practitioner

## 2021-11-06 ENCOUNTER — Telehealth (INDEPENDENT_AMBULATORY_CARE_PROVIDER_SITE_OTHER): Payer: Medicare Other | Admitting: Nurse Practitioner

## 2021-11-06 VITALS — BP 140/92 | HR 77 | Temp 94.2°F | Ht 66.0 in | Wt 192.0 lb

## 2021-11-06 DIAGNOSIS — R053 Chronic cough: Secondary | ICD-10-CM

## 2021-11-06 MED ORDER — GUAIFENESIN-DM 100-10 MG/5ML PO SYRP
5.0000 mL | ORAL_SOLUTION | ORAL | 0 refills | Status: DC | PRN
Start: 2021-11-06 — End: 2022-02-04

## 2021-11-06 MED ORDER — BENZONATATE 100 MG PO CAPS: 100.0000 mg | ORAL_CAPSULE | Freq: Three times a day (TID) | ORAL | 1 refills | Status: DC | PRN

## 2021-11-06 MED ORDER — PREDNISONE 10 MG (21) PO TBPK: ORAL_TABLET | ORAL | 0 refills | Status: DC

## 2021-11-06 NOTE — Progress Notes (Signed)
Virtual Visit via video    This visit type was conducted due to national recommendations for restrictions regarding the COVID-19 Pandemic (e.g. social distancing) in an effort to limit this patient's exposure and mitigate transmission in our community.  Due to her co-morbid illnesses, this patient is at least at moderate risk for complications without adequate follow up.  This format is felt to be most appropriate for this patient at this time.  All issues noted in this document were discussed and addressed.  A limited physical exam was performed with this format.    This visit type was conducted due to national recommendations for restrictions regarding the COVID-19 Pandemic (e.g. social distancing) in an effort to limit this patient's exposure and mitigate transmission in our community.  Patients identity confirmed using two different identifiers.  This format is felt to be most appropriate for this patient at this time.  All issues noted in this document were discussed and addressed.  No physical exam was performed (except for noted visual exam findings with Video Visits).    Date:  11/06/2021   ID:  Christine Mcdowell, DOB July 18, 1971, MRN [id number]  Patient Location:  Home   Provider location:   Office   Chief Complaint: Cough   History of Present Illness:    Christine Mcdowell is a 50 y.o. female who presents via video conferencing for a telehealth visit today.    The patient does have symptoms concerning for COVID-19 infection (fever, chills, cough, or new shortness of breath).   The patient is having an appt for an ER f/u. x3 weeks ago ED with bronchitis. She has no fever. She is currently taking doxycyline which she got from the ED visit. She has no other symptoms. Denies chest pain or shortness of breath. She does have appt with the pulmonologist on 12/2. She has had bronchitis in the past. She has a history of lupus as well.     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   Discoid lupus    Fibromyalgia    History of blood transfusion "several"   "related to anemia; had some w/hysterectomy also"   Hx of cardiovascular stress test    ETT-Myoview (9/15):  No ischemia, EF 52%; NORMAL   Hx of echocardiogram    Echo (9/15):  EF 50-55%, ant HK, Gr 1 DD, mild MR, mild LAE, no effusion   Hypertension    Iron deficiency anemia    h/o iron transfusions   Lupus (systemic lupus erythematosus) ([place])    Migraine    "a few/year" (07/03/2016)   Pneumonia 12/2015   RA (rheumatoid arthritis) ([place])    "all over" (07/03/2016)   Sickle cell trait ([place])    Stroke ([place]) 2014 X 1; 2015 X 2; 2016 X 1;    "right side of face more relaxed than the other; rare speech hesitation" (07/03/2016)   Vaginal Pap smear, abnormal    ASCUS; HPV   Past Surgical History:  Procedure Laterality Date   ABDOMINAL HYSTERECTOMY  2009   ABDOMINAL WOUND DEHISCENCE  2009   BUNIONECTOMY Left 06/15/2020   CARDIAC CATHETERIZATION N/A 10/11/2016   Procedure: Right/Left Heart Cath and Coronary Angiography;  Surgeon: Shaune Pascal Bensimhon,  MD;  Location: [facility];  Service: Cardiovascular;  Laterality: N/A;   DILATION AND CURETTAGE OF UTERUS  1991   HEMATOMA EVACUATION  2009   abdomen   ICD IMPLANT  01/28/2018   ICD IMPLANT N/A 01/28/2018   Procedure: ICD IMPLANT;  Surgeon: Evans Lance, MD;  Location: [facility];  Service: Cardiovascular;  Laterality: N/A;   INCISE AND DRAIN ABCESS  2009 X 2   "abdomen after hysterectomy"   KNEE ARTHROSCOPY Right 1997   KNEE SURGERY Right    TUBAL LIGATION  1996     Current Meds  Medication Sig   aspirin 81 MG chewable tablet Chew 162 mg by mouth daily. Taking 2 tablets   atorvastatin (LIPITOR)  10 MG tablet Take 1 tablet (10 mg total) by mouth daily.   benzonatate (TESSALON PERLES) 100 MG capsule Take 1 capsule (100 mg total) by mouth 3 (three) times daily as needed for cough.   budesonide-formoterol (SYMBICORT) 80-4.5 MCG/ACT inhaler Inhale 2 puffs into the lungs 2 (two) times daily. in the morning and at bedtime. (Patient taking differently: Inhale 2 puffs into the lungs 2 (two) times daily. in the morning and at bedtime as needed)   carvedilol (COREG) 25 MG tablet TAKE ONE TABLET BY MOUTH EVERY MORNING and TAKE ONE TABLET BY MOUTH EVERY EVENING   Continuous Blood Gluc Receiver (DEXCOM G6 RECEIVER) DEVI 1 Device by Does not apply route 3 (three) times daily before meals.   Continuous Blood Gluc Sensor (DEXCOM G6 SENSOR) MISC Inject 1 each into the skin 3 (three) times daily.   Continuous Blood Gluc Transmit (DEXCOM G6 TRANSMITTER) MISC 1 Device by Does not apply route 3 (three) times daily before meals.   doxycycline (VIBRAMYCIN) 100 MG capsule Take 1 capsule (100 mg total) by mouth 2 (two) times daily.   ENTRESTO 97-103 MG TAKE ONE TABLET BY MOUTH TWICE DAILY   folic acid (FOLVITE) 1 MG tablet TAKE 1 TABLET BY MOUTH EVERY MORNING   Glucagon (GVOKE HYPOPEN 2-PACK) 1 MG/0.2ML SOAJ Inject 1 mg into the skin as needed.   guaiFENesin-dextromethorphan (ROBITUSSIN DM) 100-10 MG/5ML syrup Take 5 mLs by mouth every 4 (four) hours as needed for cough.   hydrALAZINE (APRESOLINE) 25 MG tablet Take 1 tablet (25 mg total) by mouth 3 (three) times daily. Please schedule appointment for future refills. Thank you   hydroxychloroquine (PLAQUENIL) 200 MG tablet Take 1 tablet (200 mg total) by mouth 2 (two) times daily.   insulin degludec (TRESIBA FLEXTOUCH) 100 UNIT/ML FlexTouch Pen Inject 10 Units into the skin daily.   insulin lispro (HUMALOG KWIKPEN) 100 UNIT/ML KwikPen Use according to sliding scale   Insulin Pen Needle (NOVOFINE PLUS PEN NEEDLE) 32G X 4 MM MISC Use with insulin pens dx code e11.65    Ipratropium-Albuterol (COMBIVENT RESPIMAT) 20-100 MCG/ACT AERS respimat Inhale 1 puff into the lungs every 6 (six) hours.   ipratropium-albuterol (DUONEB) 0.5-2.5 (3) MG/3ML SOLN Take 3 mLs by nebulization every 6 (six) hours as needed.   Magnesium 400 MG TABS Take 1 tablet by mouth daily.    meloxicam (MOBIC) 15 MG tablet Take 15 mg by mouth daily.    metFORMIN (GLUCOPHAGE) 500 MG tablet TAKE ONE TABLET BY MOUTH TWICE DAILY   methotrexate (RHEUMATREX) 2.5 MG tablet Take 20 mg by mouth every Friday.    metolazone (ZAROXOLYN) 2.5 MG tablet TAKE 1 TABLET BY MOUTH AS NEEDED FOR WEIGHT GAIN OF 5 POUNDS WITHIN 3 DAYS AS DIRECTED needs  appt for further refills   montelukast (SINGULAIR) 10 MG tablet TAKE ONE TABLET BY MOUTH EVERY MORNING   Multiple Vitamin (MULTIVITAMIN WITH MINERALS) TABS Take 1 tablet by mouth every morning.    ondansetron (ZOFRAN-ODT) 4 MG disintegrating tablet DISSOLVE ONE TABLET UNDER THE TONGUE EVERY TWELVE HOURS AS NEEDED   PARoxetine (PAXIL) 10 MG tablet TAKE ONE TABLET BY MOUTH EVERY MORNING   potassium chloride SA (KLOR-CON) 10 MEQ tablet Take 3 tablets (30 mEq total) by mouth 2 (two) times daily.   predniSONE (STERAPRED UNI-PAK 21 TAB) 10 MG (21) TBPK tablet Use as directed   PROAIR HFA 108 (90 Base) MCG/ACT inhaler INHALE 2 PUFFS BY MOUTH EVERY DAY AS NEEDED FOR SHORTNESS OF BREATH   spironolactone (ALDACTONE) 25 MG tablet TAKE ONE TABLET BY MOUTH EVERY MORNING and TAKE ONE TABLET BY MOUTH EVERY EVENING   torsemide (DEMADEX) 20 MG tablet Take 40 mg by mouth daily as needed.   traZODone (DESYREL) 50 MG tablet Take 50 mg by mouth at bedtime as needed.   Vitamin D, Ergocalciferol, (DRISDOL) 1.25 MG (50000 UNIT) CAPS capsule TAKE ONE CAPSULE BY MOUTH twice A WEEK ON ONCE WEEKLY ON TUESDAY AND ON ONCE WEEKLY ON FRIDAY     Allergies:   Hydrocodone-acetaminophen, Hydromorphone, Iodinated diagnostic agents, Other, Erythromycin, Latex, Rinvoq [upadacitinib], Farxiga [dapagliflozin],  Tape, and Mircette [desogestrel-ethinyl estradiol]   Social History   Tobacco Use   Smoking status: Never   Smokeless tobacco: Never  Vaping Use   Vaping Use: Never used  Substance Use Topics   Alcohol use: No   Drug use: No     Family Hx: The patient's family history includes Allergies in her father and mother; Arthritis in her mother; Asthma in her maternal grandmother; Cancer in her paternal grandfather; Cushing syndrome in her father; Dementia in her paternal grandmother; Depression in her father; Diabetes in her maternal grandmother; Drug abuse in her mother; Heart attack in her father and maternal grandfather; Heart murmur in her mother; Hypertension in her maternal grandmother. There is no history of Breast cancer.  ROS:   Please see the history of present illness.    Review of Systems  Constitutional:  Negative for chills and fever.  HENT:  Negative for congestion and sore throat.   Respiratory:  Positive for cough. Negative for shortness of breath and wheezing.   Cardiovascular:  Negative for chest pain.  Gastrointestinal:  Negative for constipation and diarrhea.  Musculoskeletal: Negative.   Neurological:  Negative for headaches.   All other systems reviewed and are negative.   Labs/Other Tests and Data Reviewed:    Recent Labs: 10/06/2021: B Natriuretic Peptide 40.3 10/11/2021: ALT 15; BUN 19; Creatinine, Ser 0.94; Hemoglobin 10.6; Platelets 226; Potassium 3.9; Sodium 142   Recent Lipid Panel Lab Results  Component Value Date/Time   CHOL 181 10/11/2021 12:55 PM   TRIG 153 (H) 10/11/2021 12:55 PM   HDL 54 10/11/2021 12:55 PM   CHOLHDL 3.4 10/11/2021 12:55 PM   LDLCALC 100 (H) 10/11/2021 12:55 PM    Wt Readings from Last 3 Encounters:  11/06/21 192 lb (87.1 kg)  11/05/21 192 lb (87.1 kg)  10/11/21 184 lb (83.5 kg)    BP Readings from Last 3 Encounters:  11/06/21 (!) 140/92  11/05/21 122/84  10/11/21 110/80    Exam:    Vital Signs:  BP (!) 140/92  Comment: pt provided  Pulse 77   Temp (!) 94.2 F (34.6 C)   Ht 5\' 6"  (1.676 m)  Wt 192 lb (87.1 kg) Comment: pt provided  BMI 30.99 kg/m     Physical Exam Vitals and nursing note reviewed.  HENT:     Head: Normocephalic and atraumatic.  Pulmonary:     Effort: Pulmonary effort is normal.  Neurological:     Mental Status: She is alert and oriented to person, place, and time.  Psychiatric:        Mood and Affect: Affect normal.    ASSESSMENT & PLAN:    1. Chronic cough -patient was recently in the ED and they gave her doxycycline; she is still taking it  -referral to the pulmonologist has been placed. Pt. Has appt with them 12/2.  - predniSONE (STERAPRED UNI-PAK 21 TAB) 10 MG (21) TBPK tablet; Use as directed  Dispense: 21 each; Refill: 0 - benzonatate (TESSALON PERLES) 100 MG capsule; Take 1 capsule (100 mg total) by mouth 3 (three) times daily as needed for cough.  Dispense: 30 capsule; Refill: 1 - guaiFENesin-dextromethorphan (ROBITUSSIN DM) 100-10 MG/5ML syrup; Take 5 mLs by mouth every 4 (four) hours as needed for cough.  Dispense: 118 mL; Refill: 0 -Advised patient if her symptoms get any worse or if she experiences any SOB or chest pain to go to the emergency room.   The patient was encouraged to call or send a message through [place] for any questions or concerns.   Follow up: if symptoms persist or do not get better.   Side effects and appropriate use of all the medication(s) were discussed with the patient today. Patient advised to use the medication(s) as directed by their healthcare provider. The patient was encouraged to read, review, and understand all associated package inserts and contact our office with any questions or concerns. The patient accepts the risks of the treatment plan and had an opportunity to ask questions.   Patient was given opportunity to ask questions. Patient verbalized understanding of the plan and was able to repeat key elements of the plan.  All questions were answered to their satisfaction.  Raman Fausto Sampedro, DNP   I, Raman Hannah Crill have reviewed all documentation for this visit. The documentation on 05/03/21 for the exam, diagnosis, procedures, and orders are all accurate and complete.    COVID-19 Education: The signs and symptoms of COVID-19 were discussed with the patient and how to seek care for testing (follow up with PCP or arrange E-visit).  The importance of social distancing was discussed today.  Patient Risk:   After full review of this patients clinical status, I feel that they are at least moderate risk at this time.  Time:   Today, I have spent 10 minutes/ seconds with the patient with telehealth technology discussing above diagnoses.     Medication Adjustments/Labs and Tests Ordered: Current medicines are reviewed at length with the patient today.  Concerns regarding medicines are outlined above.   Tests Ordered: No orders of the defined types were placed in this encounter.   Medication Changes: Meds ordered this encounter  Medications   predniSONE (STERAPRED UNI-PAK 21 TAB) 10 MG (21) TBPK tablet    Sig: Use as directed    Dispense:  21 each    Refill:  0   benzonatate (TESSALON PERLES) 100 MG capsule    Sig: Take 1 capsule (100 mg total) by mouth 3 (three) times daily as needed for cough.    Dispense:  30 capsule    Refill:  1   guaiFENesin-dextromethorphan (ROBITUSSIN DM) 100-10 MG/5ML syrup    Sig:  Take 5 mLs by mouth every 4 (four) hours as needed for cough.    Dispense:  118 mL    Refill:  0     Disposition:  Follow up as needed or if symptoms get worse.   Signed, Bary Castilla, NP

## 2021-11-11 DIAGNOSIS — E1165 Type 2 diabetes mellitus with hyperglycemia: Secondary | ICD-10-CM | POA: Diagnosis not present

## 2021-11-13 ENCOUNTER — Other Ambulatory Visit: Payer: Self-pay

## 2021-11-13 ENCOUNTER — Telehealth: Payer: Self-pay

## 2021-11-13 ENCOUNTER — Other Ambulatory Visit (HOSPITAL_COMMUNITY): Payer: Self-pay | Admitting: *Deleted

## 2021-11-13 DIAGNOSIS — R232 Flushing: Secondary | ICD-10-CM

## 2021-11-13 MED ORDER — PAROXETINE HCL 10 MG PO TABS: 10.0000 mg | ORAL_TABLET | Freq: Every morning | ORAL | 2 refills | Status: DC

## 2021-11-13 MED ORDER — VITAMIN D (ERGOCALCIFEROL) 1.25 MG (50000 UNIT) PO CAPS: ORAL_CAPSULE | ORAL | 0 refills | Status: DC

## 2021-11-13 MED ORDER — ENTRESTO 97-103 MG PO TABS: 1.0000 | ORAL_TABLET | Freq: Two times a day (BID) | ORAL | 3 refills | Status: DC

## 2021-11-13 MED ORDER — NOVOFINE PLUS PEN NEEDLE 32G X 4 MM MISC: 3 refills | Status: AC

## 2021-11-13 NOTE — Chronic Care Management (AMB) (Signed)
Chronic Care Management Pharmacy Assistant   Name: Christine Mcdowell  MRN: [id number] DOB: [birth year]   Reason for Encounter: Medication Review/ Medication coordination   Recent office visits:  10-16-2021 LittleClaudette Stapler, RN (CCM)  11-06-2021 Bary Castilla, NP. START Tessalon 100 mg 3 times daily as needed. START robitussin DM 5 mls every 4 hours as needed. START prednisone 10 mg dose pack.  Recent consult visits:  10-29-2021 Mechele Collin, DPM. XR of left foot  Hospital visits:  Medication Reconciliation was completed by comparing discharge summary, patient's EMR and Pharmacy list, and upon discussion with patient.  Admitted to the hospital on 11-05-2021 due to acute cough. Discharge date was 11-05-2021.Discharged from [facility]?Medications Started at [facility] Discharge:?? Doxycycline 100 mg twice daily  Medication Changes at Hospital Discharge: None  Medications Discontinued at Hospital Discharge: None  Medications that remain the same after Hospital Discharge:??  -All other medications will remain the same.    Medications: Outpatient Encounter Medications as of 11/13/2021  Medication Sig   aspirin 81 MG chewable tablet Chew 162 mg by mouth daily. Taking 2 tablets   atorvastatin (LIPITOR) 10 MG tablet Take 1 tablet (10 mg total) by mouth daily.   benzonatate (TESSALON PERLES) 100 MG capsule Take 1 capsule (100 mg total) by mouth 3 (three) times daily as needed for cough.   budesonide-formoterol (SYMBICORT) 80-4.5 MCG/ACT inhaler Inhale 2 puffs into the lungs 2 (two) times daily. in the morning and at bedtime. (Patient taking differently: Inhale 2 puffs into the lungs 2 (two) times daily. in the morning and at bedtime as needed)   carvedilol (COREG) 25 MG tablet TAKE ONE TABLET BY MOUTH EVERY MORNING and TAKE ONE TABLET BY MOUTH EVERY EVENING   Continuous Blood Gluc Receiver (DEXCOM G6 RECEIVER) DEVI 1 Device by Does not apply  route 3 (three) times daily before meals.   Continuous Blood Gluc Sensor (DEXCOM G6 SENSOR) MISC Inject 1 each into the skin 3 (three) times daily.   Continuous Blood Gluc Transmit (DEXCOM G6 TRANSMITTER) MISC 1 Device by Does not apply route 3 (three) times daily before meals.   doxycycline (VIBRAMYCIN) 100 MG capsule Take 1 capsule (100 mg total) by mouth 2 (two) times daily.   ENTRESTO 97-103 MG TAKE ONE TABLET BY MOUTH TWICE DAILY   folic acid (FOLVITE) 1 MG tablet TAKE 1 TABLET BY MOUTH EVERY MORNING   Glucagon (GVOKE HYPOPEN 2-PACK) 1 MG/0.2ML SOAJ Inject 1 mg into the skin as needed.   guaiFENesin-dextromethorphan (ROBITUSSIN DM) 100-10 MG/5ML syrup Take 5 mLs by mouth every 4 (four) hours as needed for cough.   hydrALAZINE (APRESOLINE) 25 MG tablet Take 1 tablet (25 mg total) by mouth 3 (three) times daily. Please schedule appointment for future refills. Thank you   hydroxychloroquine (PLAQUENIL) 200 MG tablet Take 1 tablet (200 mg total) by mouth 2 (two) times daily.   insulin degludec (TRESIBA FLEXTOUCH) 100 UNIT/ML FlexTouch Pen Inject 10 Units into the skin daily.   insulin lispro (HUMALOG KWIKPEN) 100 UNIT/ML KwikPen Use according to sliding scale   Insulin Pen Needle (NOVOFINE PLUS PEN NEEDLE) 32G X 4 MM MISC Use with insulin pens dx code e11.65   Ipratropium-Albuterol (COMBIVENT RESPIMAT) 20-100 MCG/ACT AERS respimat Inhale 1 puff into the lungs every 6 (six) hours.   ipratropium-albuterol (DUONEB) 0.5-2.5 (3) MG/3ML SOLN Take 3 mLs by nebulization every 6 (six) hours as needed.   Magnesium 400 MG TABS Take 1 tablet by  mouth daily.    meloxicam (MOBIC) 15 MG tablet Take 15 mg by mouth daily.    metFORMIN (GLUCOPHAGE) 500 MG tablet TAKE ONE TABLET BY MOUTH TWICE DAILY   methotrexate (RHEUMATREX) 2.5 MG tablet Take 20 mg by mouth every Friday.    metolazone (ZAROXOLYN) 2.5 MG tablet TAKE 1 TABLET BY MOUTH AS NEEDED FOR WEIGHT GAIN OF 5 POUNDS WITHIN 3 DAYS AS DIRECTED needs appt for  further refills   montelukast (SINGULAIR) 10 MG tablet TAKE ONE TABLET BY MOUTH EVERY MORNING   Multiple Vitamin (MULTIVITAMIN WITH MINERALS) TABS Take 1 tablet by mouth every morning.    ondansetron (ZOFRAN-ODT) 4 MG disintegrating tablet DISSOLVE ONE TABLET UNDER THE TONGUE EVERY TWELVE HOURS AS NEEDED   PARoxetine (PAXIL) 10 MG tablet TAKE ONE TABLET BY MOUTH EVERY MORNING   potassium chloride SA (KLOR-CON) 10 MEQ tablet Take 3 tablets (30 mEq total) by mouth 2 (two) times daily.   predniSONE (STERAPRED UNI-PAK 21 TAB) 10 MG (21) TBPK tablet Use as directed   PROAIR HFA 108 (90 Base) MCG/ACT inhaler INHALE 2 PUFFS BY MOUTH EVERY DAY AS NEEDED FOR SHORTNESS OF BREATH   spironolactone (ALDACTONE) 25 MG tablet TAKE ONE TABLET BY MOUTH EVERY MORNING and TAKE ONE TABLET BY MOUTH EVERY EVENING   torsemide (DEMADEX) 20 MG tablet Take 40 mg by mouth daily as needed.   traZODone (DESYREL) 50 MG tablet Take 50 mg by mouth at bedtime as needed.   Vitamin D, Ergocalciferol, (DRISDOL) 1.25 MG (50000 UNIT) CAPS capsule TAKE ONE CAPSULE BY MOUTH twice A WEEK ON ONCE WEEKLY ON TUESDAY AND ON ONCE WEEKLY ON FRIDAY   No facility-administered encounter medications on file as of 11/13/2021.   Reviewed chart for medication changes ahead of medication coordination call.  No OVs, Consults, or hospital visits since last care coordination call/Pharmacist visit. (If appropriate, list visit date, provider name)  No medication changes indicated OR if recent visit, treatment plan here.  BP Readings from Last 3 Encounters:  11/06/21 (!) 140/92  11/05/21 122/84  10/11/21 110/80    Lab Results  Component Value Date   HGBA1C 6.4 (H) 10/11/2021     Patient obtains medications through Vials  30 Days   Last adherence delivery included:  Montelukast 10 mg- one tablet daily  Potassium CL ER 10 Meq- 3 tabs twice daily Meloxicam 15 mg- 1 tab daily Trazodone 50 mg- 1 tablet at bedtime as needed for sleep Paroxetine  10 mg- 1 tablet daily Hydralazine 25 mg- 1 tab three times daily Diclofenac 1% gel- apply small amount to joints twice daily as needed Metformin 500 mg- one tablet twice a day Entresto 97 mg/103 mg- one tablet twice a day Atorvastatin 10 mg- one tablet daily Carvedilol 25 mg- One tablet twice a day Folic acid 1 mg- daily Spironolactone 25 mg- one tablet twice a day Insulin Lispro Budes/formot Aer 80-4.5 mcg- inhale 2 puffs into lungs in the morning and bedtime. TruePlus Pen Needles 32 G-use as directed Hydroxychloroquine 200 mg- 1 tab twice daily Methotrexate 2.5 mg- 8 tabs every Friday Vitamin D 50,000 units- one capsules two times a week, Tuesday and Friday  Patient declined (meds) last month: Torsemide- off med right now  Patient is due for next adherence delivery on: 11-23-2021  Called patient and reviewed medications and coordinated delivery.  This delivery to include: Montelukast 10 mg- one tablet daily  Potassium CL ER 10 Meq- 3 tabs twice daily Meloxicam 15 mg- 1 tab daily Trazodone  50 mg- 1 tablet at bedtime as needed for sleep Paroxetine 10 mg- 1 tablet daily Hydralazine 25 mg- 1 tab three times daily Diclofenac 1% gel- apply small amount to joints twice daily as needed Metformin 500 mg- one tablet twice a day Entresto 97 mg/103 mg- one tablet twice a day Atorvastatin 10 mg- one tablet daily Carvedilol 25 mg- One tablet twice a day Folic acid 1 mg- daily Spironolactone 25 mg- one tablet twice a day Insulin Lispro Budes/formot Aer 80-4.5 mcg- inhale 2 puffs into lungs in the morning and bedtime. TruePlus Pen Needles 32 G-use as directed Hydroxychloroquine 200 mg- 1 tab twice daily Methotrexate 2.5 mg- 8 tabs every Friday Vitamin D 50,000 units- one capsules two times a week, Tuesday and Friday  No short or acute fill needed  Patient declined the following medications: None  Patient needs refills for: Vit D Paroxetine Diclofenac  gel Entresto Meloxicam Trueplus pen needles  Confirmed delivery date of 11-23-2021 advised patient that pharmacy will contact them the morning of delivery.  Care Gaps: Shingrix overdue Colonoscopy overdue Last AWV 09-06-2021 Covid booster overdue  Star Rating Drugs: Atorvastatin 10 mg- Last filled 10-19-2021 [facility] Entresto 97/103 mg- Last filled 10-19-2021 [facility] Metformin 500 mg- Last filled 09-20-2021 [facility]  [facility] Clinical Pharmacist Assistant [phone]

## 2021-11-15 DIAGNOSIS — M2012 Hallux valgus (acquired), left foot: Secondary | ICD-10-CM | POA: Diagnosis not present

## 2021-11-15 DIAGNOSIS — Z9889 Other specified postprocedural states: Secondary | ICD-10-CM | POA: Diagnosis not present

## 2021-11-19 ENCOUNTER — Ambulatory Visit (INDEPENDENT_AMBULATORY_CARE_PROVIDER_SITE_OTHER): Payer: Medicare Other

## 2021-11-19 DIAGNOSIS — Z95810 Presence of automatic (implantable) cardiac defibrillator: Secondary | ICD-10-CM

## 2021-11-19 DIAGNOSIS — I5022 Chronic systolic (congestive) heart failure: Secondary | ICD-10-CM | POA: Diagnosis not present

## 2021-11-22 ENCOUNTER — Ambulatory Visit: Payer: Medicare Other | Admitting: Nurse Practitioner

## 2021-11-22 DIAGNOSIS — M0579 Rheumatoid arthritis with rheumatoid factor of multiple sites without organ or systems involvement: Secondary | ICD-10-CM | POA: Diagnosis not present

## 2021-11-22 DIAGNOSIS — Z881 Allergy status to other antibiotic agents status: Secondary | ICD-10-CM | POA: Diagnosis not present

## 2021-11-22 DIAGNOSIS — Z7951 Long term (current) use of inhaled steroids: Secondary | ICD-10-CM | POA: Diagnosis not present

## 2021-11-22 DIAGNOSIS — G8929 Other chronic pain: Secondary | ICD-10-CM | POA: Diagnosis not present

## 2021-11-22 DIAGNOSIS — M545 Low back pain, unspecified: Secondary | ICD-10-CM | POA: Diagnosis not present

## 2021-11-22 DIAGNOSIS — Z886 Allergy status to analgesic agent status: Secondary | ICD-10-CM | POA: Diagnosis not present

## 2021-11-22 DIAGNOSIS — Z79899 Other long term (current) drug therapy: Secondary | ICD-10-CM | POA: Diagnosis not present

## 2021-11-22 DIAGNOSIS — M351 Other overlap syndromes: Secondary | ICD-10-CM | POA: Diagnosis not present

## 2021-11-22 DIAGNOSIS — I7300 Raynaud's syndrome without gangrene: Secondary | ICD-10-CM | POA: Diagnosis not present

## 2021-11-22 DIAGNOSIS — Z7952 Long term (current) use of systemic steroids: Secondary | ICD-10-CM | POA: Diagnosis not present

## 2021-11-22 DIAGNOSIS — Z91041 Radiographic dye allergy status: Secondary | ICD-10-CM | POA: Diagnosis not present

## 2021-11-22 DIAGNOSIS — Z885 Allergy status to narcotic agent status: Secondary | ICD-10-CM | POA: Diagnosis not present

## 2021-11-22 DIAGNOSIS — E559 Vitamin D deficiency, unspecified: Secondary | ICD-10-CM | POA: Diagnosis not present

## 2021-11-22 DIAGNOSIS — Z91040 Latex allergy status: Secondary | ICD-10-CM | POA: Diagnosis not present

## 2021-11-22 DIAGNOSIS — Z888 Allergy status to other drugs, medicaments and biological substances status: Secondary | ICD-10-CM | POA: Diagnosis not present

## 2021-11-22 DIAGNOSIS — M1380 Other specified arthritis, unspecified site: Secondary | ICD-10-CM | POA: Diagnosis not present

## 2021-11-22 DIAGNOSIS — E538 Deficiency of other specified B group vitamins: Secondary | ICD-10-CM | POA: Diagnosis not present

## 2021-11-23 ENCOUNTER — Other Ambulatory Visit: Payer: Self-pay

## 2021-11-23 ENCOUNTER — Ambulatory Visit (INDEPENDENT_AMBULATORY_CARE_PROVIDER_SITE_OTHER): Payer: Medicare Other | Admitting: Pulmonary Disease

## 2021-11-23 ENCOUNTER — Encounter: Payer: Self-pay | Admitting: Pulmonary Disease

## 2021-11-23 ENCOUNTER — Telehealth: Payer: Self-pay

## 2021-11-23 DIAGNOSIS — R053 Chronic cough: Secondary | ICD-10-CM

## 2021-11-23 DIAGNOSIS — J4520 Mild intermittent asthma, uncomplicated: Secondary | ICD-10-CM

## 2021-11-23 MED ORDER — AMOXICILLIN-POT CLAVULANATE 875-125 MG PO TABS: 1.0000 | ORAL_TABLET | Freq: Two times a day (BID) | ORAL | 0 refills | Status: DC

## 2021-11-23 NOTE — Progress Notes (Signed)
EPIC Encounter for ICM Monitoring  Patient Name: Christine Mcdowell is a 50 y.o. female Date: 11/23/2021 Primary Care Physican: [name], [place] Primary Cardiologist: [place] Electrophysiologist: Lovena Le 10/09/2021 Office Weight: 184 lbs                                                                               Attempted call to patient and unable to reach.  Transmission reviewed.    11/18/2021 [employer] Heart Failure Index 0 suggesting normal fluid levels.    Prescribed:  Torsemide 20 mg take 2 tablets (40 mg total) daily as needed Potassium 20 mEq take 3 tablets every morning and 2 tablets every evening. Spironolactone 25 mg take 1 tablet twice a day. Metolazone 2.5 mg take one tablet as needed for weight gain of 5 lbs within 3 days.     Labs: 10/11/2021 Creatinine 0.94, BUN 19, Potassium 3.9, Sodium 142 10/09/2021 Creatinine 0.85, BUN 17, Potassium 4.5, Sodium 140, GFR >60  10/06/2021 Creatinine 1.10, BUN 14, Potassium 2.7, Sodium 139, GFR >60  09/10/2021 Creatinine 1.06, BUN 11, Potassium 4.6, Sodium 142, GFR >60  08/30/2021 Creatinine 0.86, BUN 12, Potassium 3.4, Sodium 141, GFR >60  04/12/2021 Creatinine 1.08, BUN 18, Potassium 3.1, Sodium 140, GFR >60 A complete set of results can be found in Results Review.   Recommendations:  Unable to reach.     Follow-up plan: ICM clinic phone appointment on 12/31/2021.  91 day device clinic remote transmission 11/28/2021.           EP/Cardiology next office visit:  Recall for 09/01/2021 with Dr. Lovena Le.   02/04/2022 with Dr Haroldine Laws.        Copy of ICM check sent to Dr. Lovena Le.    3 month ICM trend:     8 day trend:       [name], RN 11/23/2021 4:45 PM

## 2021-11-23 NOTE — Assessment & Plan Note (Signed)
Concern here is for acute attacks of asthmatic bronchitis.  Current 1 ongoing for 3 weeks.  She has received 2 rounds of doxycycline. Review of prior CT chest does not show anatomically abnormal lungs, and no significant bronchiectasis or fibrosis lower related to lupus.  There is minimal scarring at the left lower lobe.  Previous spirometry does not show any evidence of airway obstruction.  She does not derive any benefit from inhaled steroids and bronchodilators.  Combivent seems to help the most during an acute episode.  As such I have asked her to stop Symbicort and Singulair after this acute episode subsides. She is mainly interested in preventing this current episode from developing into pneumonia.  She still has some yellow sputum several give her a course of Augmentin for 7 days.  Previous CT sinuses has not shown significant sinusitis. Clearly she has an immunocompromise status due to methotrexate and chronic steroid use and this likely perpetuates an episode of acute bronchitis.  We discussed precautions during the winter season.  She is vaccinated and boosted and fully immunized

## 2021-11-23 NOTE — Assessment & Plan Note (Signed)
She does have a chronic cough which is likely related to [facility] but clearly this medication is very important for her heart failure and she can continue on this if she tolerates the side effect

## 2021-11-23 NOTE — Patient Instructions (Signed)
X Augmentin 875 twice daily x 7 days -take for yellow/green phlegm.  Take Symbicort until bronchitis symptoms resolved, rinse mouth after use, then okay to stop.  Okay to use Combivent 2 puffs every 6 hours as needed for wheezing  Call for any issues.  Cough may be related to [facility]

## 2021-11-23 NOTE — Progress Notes (Signed)
Subjective:    Patient ID: Christine Mcdowell, female    DOB: Oct 24, 1971, 50 y.o.   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  She reports recurrent episodes of bronchitis starting as a child, she has previously lived in [place] and [place] and was recurrently requiring antibiotics for complicated sinus infection and bronchitis she was then diagnosed with lupus in 2012 and had a severe episode of bronchitis in 2018 On and off acei since 01/2014 and on entresto since 06/2017 .  This was stopped by my partner Dr. Melvyn Novas but she developed CHF exacerbation and required hospitalization.  Over the years, medications that have been tried without benefit include Advair, Breo, Symbicort and Singulair.  She feels like Combivent helps the best.  She had a similar episode of bronchitis about 52 weeks ago, she has received 2 rounds of doxycycline, prednisone was restarted by her rheumatologist and she is currently on 10 mg.  She reports minimal amounts of clear phlegm, denies wheezing and has a persistent cough and a tickle in her throat. 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 Diagnosed: 2012 Clinical features: malar rash, arthritis, Raynaud's phenomenon, myocarditis, alopecia Serology: + ANA, RNP, RF, elevated dsDNA and low complements Negative: SSA, SSB, smith, CCP, APA currently on Plaquenil 200 mg BID, methotrexate 20 mg weekly with daily folic acid and baricitinib 2mg  daily  Nonischemic cardiomyopathy , EF has recovered to 45 to 50% -Fibromyalgia -Low fibrinogen levels   Significant tests/ events reviewed  09/2021 CT angiogram chest clear lungs  10/2019 CT angiogram chest left lower lobe airspace disease Allergy profile 01/19/2018 >  Eos 0.0 /  IgE  2 RAST neg - Sinus CT  08/05/2018 neg   08/2018 CPET -spirometry showed no airway obstruction with ratio 88, FEV1 2.17/81%, FVC 74%  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   Anemia    Anginal pain (HCC)    Asthma    Cervical cancer ([place])    cervical 1996   CHF (congestive heart failure) ([place])    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   Discoid lupus    Fibromyalgia    History of blood transfusion "several"   "related to anemia; had some w/hysterectomy also"   Hx of cardiovascular stress test    ETT-Myoview (9/15):  No ischemia, EF 52%; NORMAL   Hx of echocardiogram    Echo (9/15):  EF 50-55%, ant HK, Gr 1 DD, mild MR, mild LAE, no effusion   Hypertension    Iron deficiency anemia    h/o iron transfusions   Lupus (systemic lupus erythematosus) ([place])    Migraine    "a few/year" (07/03/2016)   Pneumonia 12/2015   RA (rheumatoid arthritis) ([place])    "all over" (07/03/2016)   Sickle cell trait ([place])    Stroke ([place]) 2014 X 1; 2015 X 2; 2016 X 1;    "right side of face more relaxed than the other; rare speech hesitation" (07/03/2016)   Vaginal Pap smear, abnormal    ASCUS; HPV   Past Surgical History:  Procedure Laterality Date   ABDOMINAL HYSTERECTOMY  2009   ABDOMINAL WOUND DEHISCENCE  2009   BUNIONECTOMY Left 06/15/2020   CARDIAC CATHETERIZATION N/A 10/11/2016   Procedure: Right/Left Heart  Cath and Coronary Angiography;  Surgeon: Jolaine Artist, MD;  Location: [place] [facility];  Service: Cardiovascular;  Laterality: N/A;   DILATION AND CURETTAGE OF UTERUS  1991   HEMATOMA EVACUATION  2009   abdomen   ICD IMPLANT  01/28/2018   ICD IMPLANT N/A 01/28/2018   Procedure: ICD IMPLANT;  Surgeon: Evans Lance, MD;  Location: [place] [facility];  Service: Cardiovascular;  Laterality: N/A;   INCISE AND DRAIN ABCESS  2009 X 2   "abdomen after hysterectomy"   KNEE ARTHROSCOPY Right 1997   KNEE SURGERY Right     TUBAL LIGATION  1996    Allergies  Allergen Reactions   Hydrocodone-Acetaminophen Nausea And Vomiting   Hydromorphone Nausea And Vomiting    Other reaction(s): GI Upset (intolerance), Hypertension (intolerance) Raises blood pressure  Other reaction(s): GI Upset (intolerance), Hypertension (intolerance) Raises blood pressure to stroke level   Iodinated Diagnostic Agents Other (See Comments)    Shuts down kidneys Shuts kidney function down   Other Other (See Comments) and Anaphylaxis    Spicy foods and seasonings Skin Prep "makes my skin peel off" Paper tape causes skin burns   Erythromycin Nausea And Vomiting   Latex Hives   Rinvoq [Upadacitinib] Other (See Comments)   Farxiga [Dapagliflozin] Other (See Comments)   Tape Other (See Comments)    "Skin burns"   Mircette [Desogestrel-Ethinyl Estradiol] Nausea And Vomiting and Rash    .soc   Family History  Problem Relation Age of Onset   Arthritis Mother    Heart murmur Mother    Drug abuse Mother    Allergies Mother    Heart attack Father    Cushing syndrome Father    Depression Father    Allergies Father    Dementia Paternal Grandmother    Cancer Paternal Grandfather    Diabetes Maternal Grandmother    Hypertension Maternal Grandmother    Asthma Maternal Grandmother    Heart attack Maternal Grandfather    Breast cancer Neg Hx        Review of Systems  Shortness of breath with activity Productive cough Acid heartburn Headaches, nasal congestion Feet swelling Joint stiffness   Constitutional: negative for anorexia, fevers and sweats  Eyes: negative for irritation, redness and visual disturbance  Ears, nose, mouth, throat, and face: negative for earaches, epistaxis, nasal congestion and sore throat  Respiratory: negative for cough, sputum and wheezing  Cardiovascular: negative for chest pain, lower extremity edema, orthopnea, palpitations and syncope  Gastrointestinal: negative for abdominal pain,  constipation, diarrhea, melena, nausea and vomiting  Genitourinary:negative for dysuria, frequency and hematuria  Hematologic/lymphatic: negative for bleeding, easy bruising and lymphadenopathy  Musculoskeletal:negative for arthralgias, muscle weakness  Neurological: negative for coordination problems, gait problems, headaches and weakness  Endocrine: negative for diabetic symptoms including polydipsia, polyuria and weight loss     Objective:   Physical Exam   Gen. Pleasant, obese, in no distress, normal affect ENT - no pallor,icterus, no post nasal drip, class 2 airway Neck: No JVD, no thyromegaly, no carotid bruits Lungs: no use of accessory muscles, no dullness to percussion, decreased without rales or rhonchi  Cardiovascular: Rhythm regular, heart sounds  normal, no murmurs or gallops, no peripheral edema Abdomen: soft and non-tender, no hepatosplenomegaly, BS normal. Musculoskeletal: No deformities, no cyanosis or clubbing Neuro:  alert, non focal, no tremors        Assessment & Plan:

## 2021-11-23 NOTE — Telephone Encounter (Signed)
Remote ICM transmission received.  Attempted call to patient regarding ICM remote transmission and no answer.  Left message with phone number to return call.

## 2021-11-26 DIAGNOSIS — M2012 Hallux valgus (acquired), left foot: Secondary | ICD-10-CM | POA: Diagnosis not present

## 2021-11-26 DIAGNOSIS — Z981 Arthrodesis status: Secondary | ICD-10-CM | POA: Diagnosis not present

## 2021-11-27 ENCOUNTER — Other Ambulatory Visit: Payer: Self-pay | Admitting: Nurse Practitioner

## 2021-11-27 ENCOUNTER — Telehealth: Payer: Medicare Other

## 2021-11-27 ENCOUNTER — Encounter: Payer: Self-pay | Admitting: Nurse Practitioner

## 2021-11-27 DIAGNOSIS — E1165 Type 2 diabetes mellitus with hyperglycemia: Secondary | ICD-10-CM

## 2021-11-27 MED ORDER — TRESIBA FLEXTOUCH 100 UNIT/ML ~~LOC~~ SOPN: 10.0000 [IU] | PEN_INJECTOR | Freq: Every day | SUBCUTANEOUS | 1 refills | Status: DC

## 2021-11-28 ENCOUNTER — Other Ambulatory Visit (HOSPITAL_COMMUNITY): Payer: Self-pay

## 2021-11-28 ENCOUNTER — Ambulatory Visit (INDEPENDENT_AMBULATORY_CARE_PROVIDER_SITE_OTHER): Payer: Medicare Other

## 2021-11-28 DIAGNOSIS — I5022 Chronic systolic (congestive) heart failure: Secondary | ICD-10-CM

## 2021-11-28 LAB — CUP PACEART REMOTE DEVICE CHECK
Battery Remaining Longevity: 144 mo
Battery Remaining Percentage: 100 %
Brady Statistic RV Percent Paced: 0 %
Date Time Interrogation Session: 20221207045200
HighPow Impedance: 72 Ohm
Implantable Lead Implant Date: 20190206
Implantable Lead Location: 753860
Implantable Lead Model: 292
Implantable Lead Serial Number: 438194
Implantable Pulse Generator Implant Date: 20190206
Lead Channel Impedance Value: 530 Ohm
Lead Channel Setting Pacing Amplitude: 2.5 V
Lead Channel Setting Pacing Pulse Width: 0.4 ms
Lead Channel Setting Sensing Sensitivity: 0.5 mV
Pulse Gen Serial Number: 243572

## 2021-12-04 DIAGNOSIS — M2012 Hallux valgus (acquired), left foot: Secondary | ICD-10-CM | POA: Diagnosis not present

## 2021-12-07 ENCOUNTER — Telehealth (HOSPITAL_COMMUNITY): Payer: Self-pay | Admitting: Surgery

## 2021-12-07 DIAGNOSIS — G4733 Obstructive sleep apnea (adult) (pediatric): Secondary | ICD-10-CM | POA: Diagnosis not present

## 2021-12-07 NOTE — Progress Notes (Signed)
Remote ICD transmission.   

## 2021-12-07 NOTE — Telephone Encounter (Signed)
Patient called and reminded of pending ordered home sleep study.  She admits that she forgot to come pick up second device after we discussed the need to repeat.  She will come on Monday then complete the study again.

## 2021-12-11 DIAGNOSIS — E1165 Type 2 diabetes mellitus with hyperglycemia: Secondary | ICD-10-CM | POA: Diagnosis not present

## 2021-12-13 ENCOUNTER — Telehealth: Payer: Self-pay

## 2021-12-13 ENCOUNTER — Other Ambulatory Visit: Payer: Self-pay

## 2021-12-13 DIAGNOSIS — E1165 Type 2 diabetes mellitus with hyperglycemia: Secondary | ICD-10-CM

## 2021-12-13 MED ORDER — POTASSIUM CHLORIDE CRYS ER 10 MEQ PO TBCR: 30.0000 meq | EXTENDED_RELEASE_TABLET | Freq: Two times a day (BID) | ORAL | 3 refills | Status: DC

## 2021-12-13 MED ORDER — VITAMIN D (ERGOCALCIFEROL) 1.25 MG (50000 UNIT) PO CAPS: ORAL_CAPSULE | ORAL | 0 refills | Status: DC

## 2021-12-13 MED ORDER — TRESIBA FLEXTOUCH 100 UNIT/ML ~~LOC~~ SOPN: 10.0000 [IU] | PEN_INJECTOR | Freq: Every day | SUBCUTANEOUS | 1 refills | Status: DC

## 2021-12-13 MED ORDER — INSULIN LISPRO (1 UNIT DIAL) 100 UNIT/ML (KWIKPEN): PEN_INJECTOR | SUBCUTANEOUS | 11 refills | Status: DC

## 2021-12-13 NOTE — Chronic Care Management (AMB) (Addendum)
12/21/2021-Pharmacy unable to fill Tresiba until 01/29/2022, coordinated acute fill to be delivered 01/29/2022.    Chronic Care Management Pharmacy Assistant   Name: Seleste Tallman  MRN: [id number] DOB: 08/09/1971  Reason for Encounter: Medication Review/Medication Coordination Call   Recent office visits:  None  Recent consult visits:  11/19/2021- Sharman Cheek, RN [facility])- ICM monitoring  11/22/2021- Lorenza Cambridge, MD (Rheumatology)- Prednisone 10 mg- 1 tablet daily ordered through the end of the month. Then taper to 7.5 mg daily.  11/23/2021- Kara Mead, MD (Pulmonology)- Amoxicillin-Pot Clavulanate 875/125 mg- 1 tablet two times daily for 7 days ordered.  11/26/2021- Arville Go, MD (Podiatry)- Notes unavailable  11/28/2021- Roma Kayser, RN [facility])- [place] [facility] visits:  Medication Reconciliation was completed by comparing discharge summary, patients EMR and Pharmacy list, and upon discussion with patient.  Admitted to the hospital on 11-05-2021 due to acute cough. Discharge date was 11-05-2021.Discharged from [place]?Medications Started at [facility] Discharge:?? Doxycycline 100 mg twice daily   Medication Changes at Hospital Discharge: None   Medications Discontinued at Hospital Discharge: None  Medications that remain the same after Hospital Discharge:??  -All other medications will remain the same.     Admitted to the hospital on 10-06-2021 due to Moderate persistent asthma with acute exacerbation Discharge date was 10-07-2021 Discharged from [place]?Medications Started at [facility] Discharge:?? Doxycycline 100 mg twice daily Prednisone Take 2 tablets (40 mg total) by mouth daily. Take the 40mg  for 5 days and then take 20 mg (1 tab) po for 3 days and then 10mg (1/2 tab) for 4 days   Medication Changes at Hospital Discharge: None   Medications  Discontinued at Hospital Discharge: None  Medications that remain the same after Hospital Discharge:??  -All other medications will remain the same.    Medications: Outpatient Encounter Medications as of 12/13/2021  Medication Sig   amoxicillin-clavulanate (AUGMENTIN) 875-125 MG tablet Take 1 tablet by mouth 2 (two) times daily.   aspirin 81 MG chewable tablet Chew 162 mg by mouth daily. Taking 2 tablets   atorvastatin (LIPITOR) 10 MG tablet Take 1 tablet (10 mg total) by mouth daily.   benzonatate (TESSALON PERLES) 100 MG capsule Take 1 capsule (100 mg total) by mouth 3 (three) times daily as needed for cough. (Patient not taking: Reported on 11/23/2021)   budesonide-formoterol (SYMBICORT) 80-4.5 MCG/ACT inhaler Inhale 2 puffs into the lungs 2 (two) times daily. in the morning and at bedtime. (Patient taking differently: Inhale 2 puffs into the lungs 2 (two) times daily. in the morning and at bedtime as needed)   carvedilol (COREG) 25 MG tablet TAKE ONE TABLET BY MOUTH EVERY MORNING and TAKE ONE TABLET BY MOUTH EVERY EVENING   Continuous Blood Gluc Receiver (DEXCOM G6 RECEIVER) DEVI 1 Device by Does not apply route 3 (three) times daily before meals.   Continuous Blood Gluc Sensor (DEXCOM G6 SENSOR) MISC Inject 1 each into the skin 3 (three) times daily.   Continuous Blood Gluc Transmit (DEXCOM G6 TRANSMITTER) MISC 1 Device by Does not apply route 3 (three) times daily before meals.   doxycycline (VIBRAMYCIN) 100 MG capsule Take 1 capsule (100 mg total) by mouth 2 (two) times daily.   folic acid (FOLVITE) 1 MG tablet TAKE 1 TABLET BY MOUTH EVERY MORNING   Glucagon (GVOKE HYPOPEN 2-PACK) 1 MG/0.2ML SOAJ Inject 1 mg into the skin as needed.  guaiFENesin-dextromethorphan (ROBITUSSIN DM) 100-10 MG/5ML syrup Take 5 mLs by mouth every 4 (four) hours as needed for cough. (Patient not taking: Reported on 11/23/2021)   hydrALAZINE (APRESOLINE) 25 MG tablet Take 1 tablet (25 mg total) by mouth 3 (three)  times daily. Please schedule appointment for future refills. Thank you   hydroxychloroquine (PLAQUENIL) 200 MG tablet Take 1 tablet (200 mg total) by mouth 2 (two) times daily.   insulin degludec (TRESIBA FLEXTOUCH) 100 UNIT/ML FlexTouch Pen Inject 10 Units into the skin daily.   insulin lispro (HUMALOG KWIKPEN) 100 UNIT/ML KwikPen Use according to sliding scale   Insulin Pen Needle (NOVOFINE PLUS PEN NEEDLE) 32G X 4 MM MISC Use with insulin pens dx code e11.65   Ipratropium-Albuterol (COMBIVENT RESPIMAT) 20-100 MCG/ACT AERS respimat Inhale 1 puff into the lungs every 6 (six) hours.   ipratropium-albuterol (DUONEB) 0.5-2.5 (3) MG/3ML SOLN Take 3 mLs by nebulization every 6 (six) hours as needed.   Magnesium 400 MG TABS Take 1 tablet by mouth daily.    meloxicam (MOBIC) 15 MG tablet Take 15 mg by mouth daily.  (Patient not taking: Reported on 11/23/2021)   metFORMIN (GLUCOPHAGE) 500 MG tablet TAKE ONE TABLET BY MOUTH TWICE DAILY   methotrexate (RHEUMATREX) 2.5 MG tablet Take 20 mg by mouth every Friday.    metolazone (ZAROXOLYN) 2.5 MG tablet TAKE 1 TABLET BY MOUTH AS NEEDED FOR WEIGHT GAIN OF 5 POUNDS WITHIN 3 DAYS AS DIRECTED needs appt for further refills   montelukast (SINGULAIR) 10 MG tablet TAKE ONE TABLET BY MOUTH EVERY MORNING (Patient not taking: Reported on 11/23/2021)   Multiple Vitamin (MULTIVITAMIN WITH MINERALS) TABS Take 1 tablet by mouth every morning.    ondansetron (ZOFRAN-ODT) 4 MG disintegrating tablet DISSOLVE ONE TABLET UNDER THE TONGUE EVERY TWELVE HOURS AS NEEDED   PARoxetine (PAXIL) 10 MG tablet Take 1 tablet (10 mg total) by mouth every morning.   potassium chloride SA (KLOR-CON) 10 MEQ tablet Take 3 tablets (30 mEq total) by mouth 2 (two) times daily.   predniSONE (STERAPRED UNI-PAK 21 TAB) 10 MG (21) TBPK tablet Use as directed   PROAIR HFA 108 (90 Base) MCG/ACT inhaler INHALE 2 PUFFS BY MOUTH EVERY DAY AS NEEDED FOR SHORTNESS OF BREATH   sacubitril-valsartan (ENTRESTO)  97-103 MG Take 1 tablet by mouth 2 (two) times daily.   spironolactone (ALDACTONE) 25 MG tablet TAKE ONE TABLET BY MOUTH EVERY MORNING and TAKE ONE TABLET BY MOUTH EVERY EVENING   torsemide (DEMADEX) 20 MG tablet Take 40 mg by mouth daily as needed. (Patient not taking: Reported on 11/23/2021)   traZODone (DESYREL) 50 MG tablet Take 50 mg by mouth at bedtime as needed.   Vitamin D, Ergocalciferol, (DRISDOL) 1.25 MG (50000 UNIT) CAPS capsule TAKE ONE CAPSULE BY MOUTH twice A WEEK ON ONCE WEEKLY ON TUESDAY AND ON ONCE WEEKLY ON FRIDAY   No facility-administered encounter medications on file as of 12/13/2021.   Reviewed chart for medication changes ahead of medication coordination call.    BP Readings from Last 3 Encounters:  11/23/21 110/78  11/06/21 (!) 140/92  11/05/21 122/84    Lab Results  Component Value Date   HGBA1C 6.4 (H) 10/11/2021     Patient obtains medications through V[facility]  30 Days   Last adherence delivery included:  Montelukast 10 mg- one tablet daily Potassium CL ER 10 Meq- 3 tabs twice daily Meloxicam 15 mg- 1 tab daily Trazodone 50 mg- 1 tablet at bedtime as needed for sleep Paroxetine 10  mg- 1 tablet daily Torsemide 20 mg- 2 tabs daily Hydralazine 25 mg- 1 tab three times daily Diclofenac 1% gel- apply small amount to joints twice daily as needed Metformin 500 mg- one tablet twice a day Entresto 97 mg/103 mg- one tablet twice a day Atorvastatin 10 mg- one tablet daily Carvedilol 25 mg- One tablet twice a day Folic acid 1 mg- daily Spironolactone 25 mg- one tablet twice a day Insulin Lispro Budes/formot Aer 80-4.5 mcg- inhale 2 puffs into lungs in the morning and bedtime. TruePlus Pen Needles 32 G-use as directed Hydroxychloroquine 200 mg- 1 tab twice daily Methotrexate 2.5 mg- 8 tabs every Friday Vitamin D 50,000 units- one capsules two times a week, Tuesday and Friday  Patient declined the following medications last month:  Torsemide- off med right  now  Patient is due for next adherence delivery on: 12/25/2021. Called patient and reviewed medications and coordinated delivery.  This delivery to include: Symbicort 80/4.5 mcg- 2 puffs twice daily Montelukast 10 mg- one tablet daily Potassium CL ER 10 Meq- 3 tabs twice daily Trazodone 50 mg- 1 tablet at bedtime as needed for sleep Paroxetine 10 mg- 1 tablet daily Hydralazine 25 mg- 1 tab three times daily Diclofenac 1% gel- apply small amount to joints twice daily as needed Metformin 500 mg- one tablet twice a day Entresto 97 mg/103 mg- one tablet twice a day Atorvastatin 10 mg- one tablet daily Carvedilol 25 mg- One tablet twice a day Folic acid 1 mg- daily Spironolactone 25 mg- one tablet twice a day Insulin Lispro (Humalog)- use according to sliding scale TruePlus Pen Needles 32 G-use as directed Hydroxychloroquine 200 mg- 1 tab twice daily Methotrexate 2.5 mg- 8 tabs every Friday Vitamin D 50,000 units- one capsules two times a week, Tuesday and Friday Tresiba 100 u/ml- Inject 10 units into skin daily   No short fill or acute fill needed.  Patient declined the following medications: Torsemide- no longer taking   Patient needs refills for: Potassium, Humalog and Vitamin D. Request sent to [place]. Request also sent for Tresiba 100 u/ml-last filled 12/04/21 at [facility] will have sent with next delivery if pharmacy is able to fill for 12/25/2021 delivery.  Trazodone, Diclofenac and Methotrexate refills needed, [facility] pharmacy will contact Dr. Rogers Blocker office for refills.   Confirmed delivery date of 12/25/2021, advised patient that pharmacy will contact them the morning of delivery. Patient would like late delivery due to having an office visit that day, [facility] can delivery after 5pm. Pharmacy notified.    Care Gaps: Shingrix overdue Colonoscopy overdue Last AWV 09-06-2021 Covid booster overdue FOOT EXAM (Yearly)- Last completed: Nov 29, 2020- Podiatry visit 11/26/2021 with  Arville Go- notes not available.  OPHTHALMOLOGY EXAM Ammie Dalton completed: Dec 12, 2020 Mammogram completed 10/03/2020  Star Rating Drugs: Atorvastatin 10 mg- Last filled 11-19-2021 30 DS [facility] Entresto 97/103 mg- Last filled 11-19-2021 30 [facility] Metformin 500 mg- Last filled 11-19-2021 30 DS [facility]  Pattricia Boss, [place] Pharmacist Assistant [phone]

## 2021-12-15 DIAGNOSIS — M2012 Hallux valgus (acquired), left foot: Secondary | ICD-10-CM | POA: Diagnosis not present

## 2021-12-15 DIAGNOSIS — Z9889 Other specified postprocedural states: Secondary | ICD-10-CM | POA: Diagnosis not present

## 2021-12-18 DIAGNOSIS — Z1211 Encounter for screening for malignant neoplasm of colon: Secondary | ICD-10-CM | POA: Diagnosis not present

## 2021-12-24 ENCOUNTER — Emergency Department (HOSPITAL_BASED_OUTPATIENT_CLINIC_OR_DEPARTMENT_OTHER)
Admission: EM | Admit: 2021-12-24 | Discharge: 2021-12-24 | Disposition: A | Discharge status: Home / Self Care | Payer: Medicare Other | Attending: Emergency Medicine | Admitting: Emergency Medicine

## 2021-12-24 ENCOUNTER — Encounter (HOSPITAL_BASED_OUTPATIENT_CLINIC_OR_DEPARTMENT_OTHER): Payer: Self-pay

## 2021-12-24 ENCOUNTER — Other Ambulatory Visit: Payer: Self-pay

## 2021-12-24 DIAGNOSIS — Z794 Long term (current) use of insulin: Secondary | ICD-10-CM | POA: Insufficient documentation

## 2021-12-24 DIAGNOSIS — K529 Noninfective gastroenteritis and colitis, unspecified: Secondary | ICD-10-CM | POA: Diagnosis not present

## 2021-12-24 DIAGNOSIS — Z7984 Long term (current) use of oral hypoglycemic drugs: Secondary | ICD-10-CM | POA: Diagnosis not present

## 2021-12-24 DIAGNOSIS — Z7982 Long term (current) use of aspirin: Secondary | ICD-10-CM | POA: Diagnosis not present

## 2021-12-24 DIAGNOSIS — Z20822 Contact with and (suspected) exposure to covid-19: Secondary | ICD-10-CM | POA: Diagnosis not present

## 2021-12-24 DIAGNOSIS — R197 Diarrhea, unspecified: Secondary | ICD-10-CM | POA: Insufficient documentation

## 2021-12-24 DIAGNOSIS — Z79899 Other long term (current) drug therapy: Secondary | ICD-10-CM | POA: Insufficient documentation

## 2021-12-24 DIAGNOSIS — Z91040 Latex allergy status: Secondary | ICD-10-CM | POA: Insufficient documentation

## 2021-12-24 DIAGNOSIS — R112 Nausea with vomiting, unspecified: Secondary | ICD-10-CM | POA: Insufficient documentation

## 2021-12-24 DIAGNOSIS — E876 Hypokalemia: Secondary | ICD-10-CM

## 2021-12-24 LAB — CBC
HCT: 33.2 % — ABNORMAL LOW (ref 36.0–46.0)
Hemoglobin: 10.8 g/dL — ABNORMAL LOW (ref 12.0–15.0)
MCH: 27.9 pg (ref 26.0–34.0)
MCHC: 32.5 g/dL (ref 30.0–36.0)
MCV: 85.8 fL (ref 80.0–100.0)
Platelets: 207 10*3/uL (ref 150–400)
RBC: 3.87 MIL/uL (ref 3.87–5.11)
RDW: 15.5 % (ref 11.5–15.5)
WBC: 5.4 10*3/uL (ref 4.0–10.5)
nRBC: 0 % (ref 0.0–0.2)

## 2021-12-24 LAB — BASIC METABOLIC PANEL
Anion gap: 9 (ref 5–15)
BUN: 19 mg/dL (ref 6–20)
CO2: 24 mmol/L (ref 22–32)
Calcium: 8.1 mg/dL — ABNORMAL LOW (ref 8.9–10.3)
Chloride: 104 mmol/L (ref 98–111)
Creatinine, Ser: 1.12 mg/dL — ABNORMAL HIGH (ref 0.44–1.00)
GFR, Estimated: 59 mL/min — ABNORMAL LOW (ref >60)
Glucose, Bld: 102 mg/dL — ABNORMAL HIGH (ref 70–99)
Potassium: 2.9 mmol/L — ABNORMAL LOW (ref 3.5–5.1)
Sodium: 137 mmol/L (ref 135–145)

## 2021-12-24 LAB — RESP PANEL BY RT-PCR (FLU A&B, COVID) ARPGX2
Influenza A by PCR: NEGATIVE
Influenza B by PCR: NEGATIVE
SARS Coronavirus 2 by RT PCR: NEGATIVE

## 2021-12-24 MED ORDER — ONDANSETRON HCL 4 MG/2ML IJ SOLN: 4.0000 mg | Freq: Once | INTRAMUSCULAR | Status: DC

## 2021-12-24 MED ORDER — POTASSIUM CHLORIDE CRYS ER 20 MEQ PO TBCR
40.0000 meq | EXTENDED_RELEASE_TABLET | Freq: Once | ORAL | Status: AC
  Administered 2021-12-24: 40 meq via ORAL
  Filled 2021-12-24: qty 2

## 2021-12-24 MED ORDER — SODIUM CHLORIDE 0.9 % IV BOLUS: 1000.0000 mL | Freq: Once | INTRAVENOUS | Status: DC

## 2021-12-24 MED ORDER — ONDANSETRON 4 MG PO TBDP
4.0000 mg | ORAL_TABLET | Freq: Once | ORAL | Status: AC
  Administered 2021-12-24: 4 mg via ORAL
  Filled 2021-12-24: qty 1

## 2021-12-24 MED ORDER — ONDANSETRON 8 MG PO TBDP: 8.0000 mg | ORAL_TABLET | Freq: Three times a day (TID) | ORAL | 0 refills | Status: DC | PRN

## 2021-12-24 MED ORDER — POTASSIUM CHLORIDE CRYS ER 20 MEQ PO TBCR: EXTENDED_RELEASE_TABLET | ORAL | 0 refills | Status: DC

## 2021-12-24 NOTE — ED Provider Notes (Signed)
[facility]PART[facility] Provider Note   CSN: [id number] Arrival date & time: 12/24/21  1101     History  Chief Complaint  Patient presents with   Diarrhea    Christine Mcdowell is a 51 y.o. female.  Patient c/o nausea, vomiting and diarrhea. Symptoms acute onset in past day, moderate, recurrent. No bloody or bilious emesis. No abd distension. No constant and/or focal abd pain, intermittent cramping. Having loose to watery bms, not bloody. No dysuria. No vaginal discharge or bleeding. No specific known ill contacts. No recent travel, abx use, or known bad food ingestion. No fever or chills. Occasional non prod cough, nasal congestion. No sore throat. No trouble breathing or swallowing. No faintness or dizziness.   The history is provided by the patient and medical records.  Diarrhea Associated symptoms: vomiting   Associated symptoms: no chills, no fever and no headaches       Home Medications Prior to Admission medications   Medication Sig Start Date End Date Taking? Authorizing Provider  amoxicillin-clavulanate (AUGMENTIN) 875-125 MG tablet Take 1 tablet by mouth 2 (two) times daily. 11/23/21   Rigoberto Noel, MD  aspirin 81 MG chewable tablet Chew 162 mg by mouth daily. Taking 2 tablets    [provider]  atorvastatin (LIPITOR) 10 MG tablet Take 1 tablet (10 mg total) by mouth daily. 04/11/21   [name], MD  benzonatate (TESSALON PERLES) 100 MG capsule Take 1 capsule (100 mg total) by mouth 3 (three) times daily as needed for cough. Patient not taking: Reported on 11/23/2021 11/06/21 11/06/22  Bary Castilla, NP  budesonide-formoterol (SYMBICORT) 80-4.5 MCG/ACT inhaler Inhale 2 puffs into the lungs 2 (two) times daily. in the morning and at bedtime. Patient taking differently: Inhale 2 puffs into the lungs 2 (two) times daily. in the morning and at bedtime as needed 09/19/21   [name], FNP  carvedilol (COREG) 25 MG tablet TAKE ONE TABLET  BY MOUTH EVERY MORNING and TAKE ONE TABLET BY MOUTH EVERY EVENING 09/17/21   Bensimhon, Shaune Pascal, MD  Continuous Blood Gluc Receiver (DEXCOM G6 RECEIVER) DEVI 1 Device by Does not apply route 3 (three) times daily before meals. 04/12/20   [name], FNP  Continuous Blood Gluc Sensor (DEXCOM G6 SENSOR) MISC Inject 1 each into the skin 3 (three) times daily. 04/12/20   [name], FNP  Continuous Blood Gluc Transmit (DEXCOM G6 TRANSMITTER) MISC 1 Device by Does not apply route 3 (three) times daily before meals. 04/12/20   [name], FNP  doxycycline (VIBRAMYCIN) 100 MG capsule Take 1 capsule (100 mg total) by mouth 2 (two) times daily. 11/05/21   Luna Fuse, MD  folic acid (FOLVITE) 1 MG tablet TAKE 1 TABLET BY MOUTH EVERY MORNING 01/30/17   Bensimhon, Shaune Pascal, MD  Glucagon (GVOKE HYPOPEN 2-PACK) 1 MG/0.2ML SOAJ Inject 1 mg into the skin as needed. 01/23/21   [name], FNP  guaiFENesin-dextromethorphan (ROBITUSSIN DM) 100-10 MG/5ML syrup Take 5 mLs by mouth every 4 (four) hours as needed for cough. Patient not taking: Reported on 11/23/2021 11/06/21   Bary Castilla, NP  hydrALAZINE (APRESOLINE) 25 MG tablet Take 1 tablet (25 mg total) by mouth 3 (three) times daily. Please schedule appointment for future refills. Thank you 09/17/21   Evans Lance, MD  hydroxychloroquine (PLAQUENIL) 200 MG tablet Take 1 tablet (200 mg total) by mouth 2 (two) times daily. 02/02/19   Bensimhon, Shaune Pascal, MD  insulin degludec (TRESIBA FLEXTOUCH) 100 UNIT/ML FlexTouch  Pen Inject 10 Units into the skin daily. 12/13/21   [name], FNP  insulin lispro (HUMALOG KWIKPEN) 100 UNIT/ML KwikPen Use according to sliding scale 12/13/21   [name], FNP  Insulin Pen Needle (NOVOFINE PLUS PEN NEEDLE) 32G X 4 MM MISC Use with insulin pens dx code e11.65 11/13/21   [name], FNP  Ipratropium-Albuterol (COMBIVENT RESPIMAT) 20-100 MCG/ACT AERS respimat Inhale 1 puff into the lungs every 6 (six) hours. 10/11/21    [name], FNP  ipratropium-albuterol (DUONEB) 0.5-2.5 (3) MG/3ML SOLN Take 3 mLs by nebulization every 6 (six) hours as needed. 10/11/21   [name], FNP  Magnesium 400 MG TABS Take 1 tablet by mouth daily.     [provider]  meloxicam (MOBIC) 15 MG tablet Take 15 mg by mouth daily.  Patient not taking: Reported on 11/23/2021    [provider]  metFORMIN (GLUCOPHAGE) 500 MG tablet TAKE ONE TABLET BY MOUTH TWICE DAILY 10/19/21   [name], FNP  methotrexate (RHEUMATREX) 2.5 MG tablet Take 20 mg by mouth every Friday.  02/06/15   [provider]  metolazone (ZAROXOLYN) 2.5 MG tablet TAKE 1 TABLET BY MOUTH AS NEEDED FOR WEIGHT GAIN OF 5 POUNDS WITHIN 3 DAYS AS DIRECTED needs appt for further refills 03/16/21   Bensimhon, Shaune Pascal, MD  montelukast (SINGULAIR) 10 MG tablet TAKE ONE TABLET BY MOUTH EVERY MORNING Patient not taking: Reported on 11/23/2021 10/19/21   [name], FNP  Multiple Vitamin (MULTIVITAMIN WITH MINERALS) TABS Take 1 tablet by mouth every morning.     [provider]  ondansetron (ZOFRAN-ODT) 4 MG disintegrating tablet DISSOLVE ONE TABLET UNDER THE TONGUE EVERY TWELVE HOURS AS NEEDED 04/11/21   [name], MD  PARoxetine (PAXIL) 10 MG tablet Take 1 tablet (10 mg total) by mouth every morning. 11/13/21   [name], FNP  potassium chloride (KLOR-CON M) 10 MEQ tablet Take 3 tablets (30 mEq total) by mouth 2 (two) times daily. 12/13/21   [name], FNP  predniSONE (STERAPRED UNI-PAK 21 TAB) 10 MG (21) TBPK tablet Use as directed 11/06/21   Bary Castilla, NP  PROAIR HFA 108 (90 Base) MCG/ACT inhaler INHALE 2 PUFFS BY MOUTH EVERY DAY AS NEEDED FOR SHORTNESS OF BREATH 03/07/20   [name], FNP  sacubitril-valsartan (ENTRESTO) 97-103 MG Take 1 tablet by mouth 2 (two) times daily. 11/13/21   Bensimhon, Shaune Pascal, MD  spironolactone (ALDACTONE) 25 MG tablet TAKE ONE TABLET BY MOUTH EVERY MORNING and TAKE ONE TABLET BY MOUTH  EVERY EVENING 09/17/21   Bensimhon, Shaune Pascal, MD  torsemide (DEMADEX) 20 MG tablet Take 40 mg by mouth daily as needed. Patient not taking: Reported on 11/23/2021    [provider]  traZODone (DESYREL) 50 MG tablet Take 50 mg by mouth at bedtime as needed. 07/05/21   [provider]  Vitamin D, Ergocalciferol, (DRISDOL) 1.25 MG (50000 UNIT) CAPS capsule TAKE ONE CAPSULE BY MOUTH twice A WEEK ON ONCE WEEKLY ON TUESDAY AND ON ONCE WEEKLY ON FRIDAY 12/13/21   [name], FNP      Allergies    Hydrocodone-acetaminophen, Hydromorphone, Iodinated contrast media, Other, Erythromycin, Latex, Rinvoq [upadacitinib], Farxiga [dapagliflozin], Tape, and Mircette [desogestrel-ethinyl estradiol]    Review of Systems   Review of Systems  Constitutional:  Negative for chills and fever.  HENT:  Positive for congestion. Negative for trouble swallowing.   Eyes:  Negative for redness.  Respiratory:  Negative for shortness of breath.   Cardiovascular:  Negative for  chest pain.  Gastrointestinal:  Positive for diarrhea, nausea and vomiting.  Genitourinary:  Negative for dysuria and flank pain.  Musculoskeletal:  Negative for back pain and neck pain.  Skin:  Negative for rash.  Neurological:  Negative for headaches.  Hematological:  Does not bruise/bleed easily.  Psychiatric/Behavioral:  Negative for confusion.    Physical Exam Updated Vital Signs BP 121/81    Pulse 81    Temp 99 F (37.2 C) (Oral)    Resp 17    Ht 1.676 m (5\' 6" )    Wt 87.1 kg    SpO2 98%    BMI 30.99 kg/m  Physical Exam Vitals and nursing note reviewed.  Constitutional:      Appearance: Normal appearance. She is well-developed.  HENT:     Head: Atraumatic.     Nose: Nose normal.     Mouth/Throat:     Mouth: Mucous membranes are moist.     Pharynx: Oropharynx is clear. No oropharyngeal exudate or posterior oropharyngeal erythema.  Eyes:     General: No scleral icterus.    Conjunctiva/sclera: Conjunctivae normal.   Neck:     Trachea: No tracheal deviation.  Cardiovascular:     Rate and Rhythm: Normal rate and regular rhythm.     Pulses: Normal pulses.     Heart sounds: Normal heart sounds. No murmur heard.   No friction rub. No gallop.  Pulmonary:     Effort: Pulmonary effort is normal. No respiratory distress.     Breath sounds: Normal breath sounds.  Abdominal:     General: Bowel sounds are normal. There is no distension.     Palpations: Abdomen is soft. There is no mass.     Tenderness: There is no abdominal tenderness. There is no guarding or rebound.     Hernia: No hernia is present.  Genitourinary:    Comments: No cva tenderness.  Musculoskeletal:        General: No swelling.     Cervical back: Normal range of motion and neck supple. No rigidity. No muscular tenderness.  Skin:    General: Skin is warm and dry.     Findings: No rash.  Neurological:     Mental Status: She is alert.     Comments: Alert, speech normal.   Psychiatric:        Mood and Affect: Mood normal.    ED Results / Procedures / Treatments   Labs (all labs ordered are listed, but only abnormal results are displayed) Results for orders placed or performed during the hospital encounter of 12/24/21  Resp Panel by RT-PCR (Flu A&B, Covid) Nasopharyngeal Swab   Specimen: Nasopharyngeal Swab; Nasopharyngeal(NP) swabs in vial transport medium  Result Value Ref Range   SARS Coronavirus 2 by RT PCR NEGATIVE NEGATIVE   Influenza A by PCR NEGATIVE NEGATIVE   Influenza B by PCR NEGATIVE NEGATIVE  Basic metabolic panel  Result Value Ref Range   Sodium 137 135 - 145 mmol/L   Potassium 2.9 (L) 3.5 - 5.1 mmol/L   Chloride 104 98 - 111 mmol/L   CO2 24 22 - 32 mmol/L   Glucose, Bld 102 (H) 70 - 99 mg/dL   BUN 19 6 - 20 mg/dL   Creatinine, Ser 1.12 (H) 0.44 - 1.00 mg/dL   Calcium 8.1 (L) 8.9 - 10.3 mg/dL   GFR, Estimated 59 (L) >60 mL/min   Anion gap 9 5 - 15  CBC  Result Value Ref Range   WBC 5.4  4.0 - 10.5 K/uL   RBC  3.87 3.87 - 5.11 MIL/uL   Hemoglobin 10.8 (L) 12.0 - 15.0 g/dL   HCT 33.2 (L) 36.0 - 46.0 %   MCV 85.8 80.0 - 100.0 fL   MCH 27.9 26.0 - 34.0 pg   MCHC 32.5 30.0 - 36.0 g/dL   RDW 15.5 11.5 - 15.5 %   Platelets 207 150 - 400 K/uL   nRBC 0.0 0.0 - 0.2 %   *Note: Due to a large number of results and/or encounters for the requested time period, some results have not been displayed. A complete set of results can be found in Results Review.   CUP PACEART REMOTE DEVICE CHECK  Result Date: 11/28/2021 Scheduled remote reviewed. Normal device function.  4 VT, 4 NSVT, rates 180's, no therapies Next remote 91 days. LR   EKG None  Radiology No results found.  Procedures Procedures    Medications Ordered in ED Medications  sodium chloride 0.9 % bolus 1,000 mL (has no administration in time range)  ondansetron (ZOFRAN) injection 4 mg (has no administration in time range)    ED Course/ Medical Decision Making/ A&P                           Medical Decision Making  Iv ns bolus. Zofran iv. Labs sent.   Reviewed nursing notes and prior charts for additional history.  Outside records reviewed.  Labs reviewed/interpreted by me - wbc normal. K mild-mod low. Kcl po.   Trial of po fluids.   Patient is tolerating po well. No recurrent nvd while in ED.  Recheck abd soft non tender. Pt continues to feel improved.   Additional labs reviewed/interpreted by me - covid and flu negative.   Patient currently appears stable for d/c.   Rec close pcp f/u.  Return precautions provided.           Final Clinical Impression(s) / ED Diagnoses Final diagnoses:  None    Rx / DC Orders ED Discharge Orders     None         Lajean Saver, MD 12/24/21 1525

## 2021-12-24 NOTE — Discharge Instructions (Addendum)
It was our pleasure to provide your ER care today - we hope that you feel better.  Drink plenty of fluids/stay well hydrated.  You may take zofran as need for nausea.   From today's labs, your potassium level is low - eat plenty of fruits and vegetables, take potassium supplement as prescribed, and follow up with primary care doctor in one week.   Follow up with primary care doctor in 2-3 days if symptoms fail to improve/resolve.  Return to ER if worse, new symptoms, new/severe pain, persistent vomiting, severe abdominal pain, severe diarrhea, or other concern.

## 2021-12-24 NOTE — ED Triage Notes (Signed)
Pt c/o n/v/d, generalized abd pain started yesterday-NAD-steady gait

## 2021-12-24 NOTE — ED Notes (Signed)
K level 2.9, ED MD notified for orders

## 2021-12-25 ENCOUNTER — Telehealth: Payer: Self-pay

## 2021-12-25 NOTE — Telephone Encounter (Signed)
Left pt vm to give the office a call back for an ED f/u.

## 2021-12-28 LAB — COLOGUARD: COLOGUARD: NEGATIVE

## 2021-12-31 ENCOUNTER — Ambulatory Visit (INDEPENDENT_AMBULATORY_CARE_PROVIDER_SITE_OTHER): Payer: Commercial Managed Care - HMO

## 2021-12-31 DIAGNOSIS — Z95810 Presence of automatic (implantable) cardiac defibrillator: Secondary | ICD-10-CM

## 2021-12-31 DIAGNOSIS — I5022 Chronic systolic (congestive) heart failure: Secondary | ICD-10-CM | POA: Diagnosis not present

## 2022-01-02 ENCOUNTER — Telehealth: Payer: Medicare Other

## 2022-01-02 ENCOUNTER — Telehealth: Payer: Self-pay

## 2022-01-02 NOTE — Telephone Encounter (Signed)
Remote ICM transmission received.  Attempted call to patient regarding ICM remote transmission and left message to return call   

## 2022-01-02 NOTE — Progress Notes (Signed)
EPIC Encounter for ICM Monitoring  Patient Name: Christine Mcdowell is a 51 y.o. female Date: 01/02/2022 Primary Care Physican: [name], [place] Primary Cardiologist: [place] Electrophysiologist: Lovena Le 10/09/2021 Office Weight: 184 lbs                                                                               Attempted call to patient and unable to reach.  Left message to return call. Transmission reviewed.    12/30/2021 [employer] Heart Failure Index 0 suggesting normal fluid levels.    Prescribed:  Torsemide 20 mg take 2 tablets (40 mg total) daily as needed Potassium 20 mEq Take 3 tablets (30 mEq total) by mouth 2 (two) times daily. Spironolactone 25 mg take 1 tablet twice a day. Metolazone 2.5 mg take one tablet as needed for weight gain of 5 lbs within 3 days.     Labs: 12/24/2021 Creatinine 1.12, BUN 19, Potassium 2.9, Sodium 137, GFR 59 10/11/2021 Creatinine 0.94, BUN 19, Potassium 3.9, Sodium 142 10/09/2021 Creatinine 0.85, BUN 17, Potassium 4.5, Sodium 140, GFR >60  10/06/2021 Creatinine 1.10, BUN 14, Potassium 2.7, Sodium 139, GFR >60  09/10/2021 Creatinine 1.06, BUN 11, Potassium 4.6, Sodium 142, GFR >60  08/30/2021 Creatinine 0.86, BUN 12, Potassium 3.4, Sodium 141, GFR >60  04/12/2021 Creatinine 1.08, BUN 18, Potassium 3.1, Sodium 140, GFR >60 A complete set of results can be found in Results Review.   Recommendations:  Unable to reach.     Follow-up plan: ICM clinic phone appointment on 02/04/2022.  91 day device clinic remote transmission 02/27/2022.           EP/Cardiology next office visit:  Recall for 03/04/2022 with Oda Kilts, [place].   02/04/2022 with Dr Haroldine Laws.        Copy of ICM check sent to Dr. Lovena Le.    3 Month Trend    8 Day Data Trend          [name], RN 01/02/2022 8:57 AM

## 2022-01-11 ENCOUNTER — Telehealth: Payer: Self-pay

## 2022-01-11 DIAGNOSIS — E1165 Type 2 diabetes mellitus with hyperglycemia: Secondary | ICD-10-CM | POA: Diagnosis not present

## 2022-01-11 NOTE — Chronic Care Management (AMB) (Signed)
Chronic Care Management Pharmacy Assistant   Name: Christine Mcdowell  MRN: [id number] DOB: 17-Jul-1971   Reason for Encounter: Medication Review/ Medication Coordination  Recent office visits:  None  Recent consult visits:  None  Hospital visits:  Medication Reconciliation was completed by comparing discharge summary, patients EMR and Pharmacy list, and upon discussion with patient.  Admitted to the hospital on 12-24-2021 due to Diarrhea. Discharge date was 12-24-2021 Discharged from [facility]?Medications Started at [facility] Discharge:?? Potassium chloride 20 MEQ One po bid x 3 days, then one po once a day Zofran 8 mg every 8 hours as needed  Medication Changes at Hospital Discharge: None  Medications Discontinued at Hospital Discharge: None  Medications that remain the same after Hospital Discharge:??  -All other medications will remain the same.    Medications: Outpatient Encounter Medications as of 01/11/2022  Medication Sig   amoxicillin-clavulanate (AUGMENTIN) 875-125 MG tablet Take 1 tablet by mouth 2 (two) times daily.   aspirin 81 MG chewable tablet Chew 162 mg by mouth daily. Taking 2 tablets   atorvastatin (LIPITOR) 10 MG tablet Take 1 tablet (10 mg total) by mouth daily.   benzonatate (TESSALON PERLES) 100 MG capsule Take 1 capsule (100 mg total) by mouth 3 (three) times daily as needed for cough. (Patient not taking: Reported on 11/23/2021)   budesonide-formoterol (SYMBICORT) 80-4.5 MCG/ACT inhaler Inhale 2 puffs into the lungs 2 (two) times daily. in the morning and at bedtime. (Patient taking differently: Inhale 2 puffs into the lungs 2 (two) times daily. in the morning and at bedtime as needed)   carvedilol (COREG) 25 MG tablet TAKE ONE TABLET BY MOUTH EVERY MORNING and TAKE ONE TABLET BY MOUTH EVERY EVENING   Continuous Blood Gluc Receiver (DEXCOM G6 RECEIVER) DEVI 1 Device by Does not apply route 3 (three) times daily before  meals.   Continuous Blood Gluc Sensor (DEXCOM G6 SENSOR) MISC Inject 1 each into the skin 3 (three) times daily.   Continuous Blood Gluc Transmit (DEXCOM G6 TRANSMITTER) MISC 1 Device by Does not apply route 3 (three) times daily before meals.   doxycycline (VIBRAMYCIN) 100 MG capsule Take 1 capsule (100 mg total) by mouth 2 (two) times daily.   folic acid (FOLVITE) 1 MG tablet TAKE 1 TABLET BY MOUTH EVERY MORNING   Glucagon (GVOKE HYPOPEN 2-PACK) 1 MG/0.2ML SOAJ Inject 1 mg into the skin as needed.   guaiFENesin-dextromethorphan (ROBITUSSIN DM) 100-10 MG/5ML syrup Take 5 mLs by mouth every 4 (four) hours as needed for cough. (Patient not taking: Reported on 11/23/2021)   hydrALAZINE (APRESOLINE) 25 MG tablet Take 1 tablet (25 mg total) by mouth 3 (three) times daily. Please schedule appointment for future refills. Thank you   hydroxychloroquine (PLAQUENIL) 200 MG tablet Take 1 tablet (200 mg total) by mouth 2 (two) times daily.   insulin degludec (TRESIBA FLEXTOUCH) 100 UNIT/ML FlexTouch Pen Inject 10 Units into the skin daily.   insulin lispro (HUMALOG KWIKPEN) 100 UNIT/ML KwikPen Use according to sliding scale   Insulin Pen Needle (NOVOFINE PLUS PEN NEEDLE) 32G X 4 MM MISC Use with insulin pens dx code e11.65   Ipratropium-Albuterol (COMBIVENT RESPIMAT) 20-100 MCG/ACT AERS respimat Inhale 1 puff into the lungs every 6 (six) hours.   ipratropium-albuterol (DUONEB) 0.5-2.5 (3) MG/3ML SOLN Take 3 mLs by nebulization every 6 (six) hours as needed.   Magnesium 400 MG TABS Take 1 tablet by mouth daily.    meloxicam (MOBIC) 15 MG  tablet Take 15 mg by mouth daily.  (Patient not taking: Reported on 11/23/2021)   metFORMIN (GLUCOPHAGE) 500 MG tablet TAKE ONE TABLET BY MOUTH TWICE DAILY   methotrexate (RHEUMATREX) 2.5 MG tablet Take 20 mg by mouth every Friday.    metolazone (ZAROXOLYN) 2.5 MG tablet TAKE 1 TABLET BY MOUTH AS NEEDED FOR WEIGHT GAIN OF 5 POUNDS WITHIN 3 DAYS AS DIRECTED needs appt for further  refills   montelukast (SINGULAIR) 10 MG tablet TAKE ONE TABLET BY MOUTH EVERY MORNING (Patient not taking: Reported on 11/23/2021)   Multiple Vitamin (MULTIVITAMIN WITH MINERALS) TABS Take 1 tablet by mouth every morning.    ondansetron (ZOFRAN-ODT) 4 MG disintegrating tablet DISSOLVE ONE TABLET UNDER THE TONGUE EVERY TWELVE HOURS AS NEEDED   ondansetron (ZOFRAN-ODT) 8 MG disintegrating tablet Take 1 tablet (8 mg total) by mouth every 8 (eight) hours as needed for nausea or vomiting.   PARoxetine (PAXIL) 10 MG tablet Take 1 tablet (10 mg total) by mouth every morning.   potassium chloride (KLOR-CON M) 10 MEQ tablet Take 3 tablets (30 mEq total) by mouth 2 (two) times daily.   potassium chloride SA (KLOR-CON M) 20 MEQ tablet One po bid x 3 days, then one po once a day   predniSONE (STERAPRED UNI-PAK 21 TAB) 10 MG (21) TBPK tablet Use as directed   PROAIR HFA 108 (90 Base) MCG/ACT inhaler INHALE 2 PUFFS BY MOUTH EVERY DAY AS NEEDED FOR SHORTNESS OF BREATH   sacubitril-valsartan (ENTRESTO) 97-103 MG Take 1 tablet by mouth 2 (two) times daily.   spironolactone (ALDACTONE) 25 MG tablet TAKE ONE TABLET BY MOUTH EVERY MORNING and TAKE ONE TABLET BY MOUTH EVERY EVENING   torsemide (DEMADEX) 20 MG tablet Take 40 mg by mouth daily as needed. (Patient not taking: Reported on 11/23/2021)   traZODone (DESYREL) 50 MG tablet Take 50 mg by mouth at bedtime as needed.   Vitamin D, Ergocalciferol, (DRISDOL) 1.25 MG (50000 UNIT) CAPS capsule TAKE ONE CAPSULE BY MOUTH twice A WEEK ON ONCE WEEKLY ON TUESDAY AND ON ONCE WEEKLY ON FRIDAY   No facility-administered encounter medications on file as of 01/11/2022.   Reviewed chart for medication changes ahead of medication coordination call.  No OVs, Consults visits since last care coordination call/Pharmacist visit.   BP Readings from Last 3 Encounters:  12/24/21 116/62  11/23/21 110/78  11/06/21 (!) 140/92    Lab Results  Component Value Date   HGBA1C 6.4 (H)  10/11/2021     Patient obtains medications through Vials  30 Days   Last adherence delivery included:  Symbicort 80/4.5 mcg- 2 puffs twice daily Montelukast 10 mg- one tablet daily Potassium CL ER 10 Meq- 3 tabs twice daily Trazodone 50 mg- 1 tablet at bedtime as needed for sleep Paroxetine 10 mg- 1 tablet daily Hydralazine 25 mg- 1 tab three times daily Diclofenac 1% gel- apply small amount to joints twice daily as needed Metformin 500 mg- one tablet twice a day Entresto 97 mg/103 mg- one tablet twice a day Atorvastatin 10 mg- one tablet daily Carvedilol 25 mg- One tablet twice a day Folic acid 1 mg- daily Spironolactone 25 mg- one tablet twice a day Insulin Lispro (Humalog)- use according to sliding scale TruePlus Pen Needles 32 G-use as directed Hydroxychloroquine 200 mg- 1 tab twice daily Methotrexate 2.5 mg- 8 tabs every Friday Vitamin D 50,000 units- one capsules two times a week, Tuesday and Friday Tresiba 100 u/ml- Inject 10 units into skin daily  Patient  declined (meds) last month:  Torsemide- no longer taking  Patient is due for next adherence delivery on: 01-23-2022  Called patient and reviewed medications and coordinated delivery.  This delivery to include: Montelukast 10 mg- one tablet daily Trazodone 50 mg- 1 tablet at bedtime as needed for sleep Symbicort 80/4.5 mcg- 2 puffs twice daily in morning and at bedtime Hydralazine 25 mg- 1 tab three times daily Metformin 500 mg- one tablet twice a day Atorvastatin 10 mg- one tablet daily Potassium CL ER 10 Meq- 3 tabs twice daily Paroxetine 10 mg- 1 tablet daily Diclofenac 1% gel- apply small amount to joints twice daily as needed Hydroxychloroquine 200 mg- 1 tab twice daily Insulin Lispro (Humalog)- use according to sliding scale Entresto 97 mg/103 mg- one tablet twice a day Carvedilol 25 mg- One tablet twice a day TruePlus Pen Needles 32 G-use as directed Folic acid 1 mg- daily Spironolactone 25 mg- one  tablet twice a day Methotrexate 2.5 mg- 8 tabs every Friday Vitamin D 50,000 units- one capsules two times a week, Tuesday and Friday  No short/acute fill needed  Patient declined the following medications:  Montelukast 10 mg. Patient stated medication is on hold per provider but not discontinued    Patient needs refills for: Sent refill requests Spironolactone- Dr. Haroldine Laws [phone]    Vitamin D2 PCP Hydroxychloroquine- Dr. Rogers Blocker [phone]   Confirmed delivery date of 01-23-2022 advised patient that pharmacy will contact them the morning of delivery.  Care Gaps: Shingrix overdue Colonoscopy overdue FOOT EXAM (Yearly)- Last completed: Nov 29, 2020- Podiatry visit 11/26/2021 with Arville Go- notes not available.   AWV   Star Rating Drugs: Atorvastatin 10 mg- Last filled 12-20-2021 [facility] Entresto 97/103 mg- Last filled 12-20-2021 [facility] Metformin 500 mg- Last filled 12-20-2021 [facility]  [place] Clinical Pharmacist Assistant [phone]

## 2022-01-15 ENCOUNTER — Other Ambulatory Visit: Payer: Self-pay

## 2022-01-15 MED ORDER — VITAMIN D (ERGOCALCIFEROL) 1.25 MG (50000 UNIT) PO CAPS: ORAL_CAPSULE | ORAL | 0 refills | Status: DC

## 2022-01-16 ENCOUNTER — Other Ambulatory Visit: Payer: Self-pay | Admitting: Nurse Practitioner

## 2022-01-16 ENCOUNTER — Other Ambulatory Visit (HOSPITAL_COMMUNITY): Payer: Self-pay | Admitting: Internal Medicine

## 2022-01-16 DIAGNOSIS — N6489 Other specified disorders of breast: Secondary | ICD-10-CM | POA: Diagnosis not present

## 2022-01-16 DIAGNOSIS — R928 Other abnormal and inconclusive findings on diagnostic imaging of breast: Secondary | ICD-10-CM | POA: Diagnosis not present

## 2022-01-16 DIAGNOSIS — Z9889 Other specified postprocedural states: Secondary | ICD-10-CM | POA: Diagnosis not present

## 2022-01-16 DIAGNOSIS — Z1231 Encounter for screening mammogram for malignant neoplasm of breast: Secondary | ICD-10-CM | POA: Diagnosis not present

## 2022-01-21 ENCOUNTER — Ambulatory Visit: Payer: Medicare Other | Admitting: Nurse Practitioner

## 2022-01-23 DIAGNOSIS — M2012 Hallux valgus (acquired), left foot: Secondary | ICD-10-CM | POA: Diagnosis not present

## 2022-01-24 ENCOUNTER — Emergency Department (HOSPITAL_BASED_OUTPATIENT_CLINIC_OR_DEPARTMENT_OTHER)
Admission: EM | Admit: 2022-01-24 | Discharge: 2022-01-24 | Disposition: A | Discharge status: Home / Self Care | Payer: Medicare Other | Attending: Emergency Medicine | Admitting: Emergency Medicine

## 2022-01-24 ENCOUNTER — Telehealth: Payer: Self-pay

## 2022-01-24 ENCOUNTER — Other Ambulatory Visit: Payer: Self-pay

## 2022-01-24 ENCOUNTER — Encounter (HOSPITAL_BASED_OUTPATIENT_CLINIC_OR_DEPARTMENT_OTHER): Payer: Self-pay | Admitting: Emergency Medicine

## 2022-01-24 ENCOUNTER — Emergency Department (HOSPITAL_BASED_OUTPATIENT_CLINIC_OR_DEPARTMENT_OTHER): Payer: Medicare Other

## 2022-01-24 DIAGNOSIS — Z20822 Contact with and (suspected) exposure to covid-19: Secondary | ICD-10-CM | POA: Insufficient documentation

## 2022-01-24 DIAGNOSIS — J9811 Atelectasis: Secondary | ICD-10-CM | POA: Diagnosis not present

## 2022-01-24 DIAGNOSIS — R059 Cough, unspecified: Secondary | ICD-10-CM | POA: Diagnosis not present

## 2022-01-24 DIAGNOSIS — Z794 Long term (current) use of insulin: Secondary | ICD-10-CM | POA: Insufficient documentation

## 2022-01-24 DIAGNOSIS — Z91040 Latex allergy status: Secondary | ICD-10-CM | POA: Insufficient documentation

## 2022-01-24 DIAGNOSIS — E119 Type 2 diabetes mellitus without complications: Secondary | ICD-10-CM | POA: Insufficient documentation

## 2022-01-24 DIAGNOSIS — R0602 Shortness of breath: Secondary | ICD-10-CM | POA: Diagnosis not present

## 2022-01-24 DIAGNOSIS — Z7984 Long term (current) use of oral hypoglycemic drugs: Secondary | ICD-10-CM | POA: Insufficient documentation

## 2022-01-24 DIAGNOSIS — I517 Cardiomegaly: Secondary | ICD-10-CM | POA: Diagnosis not present

## 2022-01-24 DIAGNOSIS — J101 Influenza due to other identified influenza virus with other respiratory manifestations: Secondary | ICD-10-CM | POA: Insufficient documentation

## 2022-01-24 LAB — RESP PANEL BY RT-PCR (FLU A&B, COVID) ARPGX2
Influenza A by PCR: POSITIVE — AB
Influenza B by PCR: NEGATIVE
SARS Coronavirus 2 by RT PCR: NEGATIVE

## 2022-01-24 MED ORDER — GUAIFENESIN-CODEINE 100-10 MG/5ML PO SOLN: 10.0000 mL | Freq: Four times a day (QID) | ORAL | 0 refills | Status: DC | PRN

## 2022-01-24 MED ORDER — OSELTAMIVIR PHOSPHATE 75 MG PO CAPS: 75.0000 mg | ORAL_CAPSULE | Freq: Two times a day (BID) | ORAL | 0 refills | Status: DC

## 2022-01-24 NOTE — Telephone Encounter (Signed)
Transition Care Management Follow-up Telephone Call Date of discharge and from where: 01/24/2022 [facility]  How have you been since you were released from the hospital? Pt states she still feels terrible.  Any questions or concerns? No  Items Reviewed: Did the pt receive and understand the discharge instructions provided? Yes  Medications obtained and verified? Yes  Other? Yes  Any new allergies since your discharge? No  Dietary orders reviewed? Yes Do you have support at home? Yes   Home Care and Equipment/Supplies: Were home health services ordered? not applicable If so, what is the name of the agency? N/a  Has the agency set up a time to come to the patient's home? not applicable Were any new equipment or medical supplies ordered?  No What is the name of the medical supply agency? N/a Were you able to get the supplies/equipment? not applicable Do you have any questions related to the use of the equipment or supplies? No  Functional Questionnaire: (I = Independent and D = Dependent) ADLs: i  Bathing/Dressing- i  Meal Prep- i  Eating- i  Maintaining continence- i  Transferring/Ambulation- i  Managing Meds- i  Follow up appointments reviewed:  PCP Hospital f/u appt confirmed? No  Scheduled to see n/a  on n/a @ n/a. [facility] f/u appt confirmed? No  Scheduled to see n/a on n/a @ n/a. Are transportation arrangements needed? No  If their condition worsens, is the pt aware to call PCP or go to the Emergency Dept.? Yes Was the patient provided with contact information for the PCP's office or ED? Yes Was to pt encouraged to call back with questions or concerns? Yes

## 2022-01-24 NOTE — ED Triage Notes (Signed)
Pt states she started with a dry cough on Monday and it has progressed to a productive cough  Pt states tonight she has been coughing so much she is short of breath  Pt states she has been using her breathing treatments at home without relief and that her chest hurts from coughing  Pt has hx of lupus and CHF

## 2022-01-24 NOTE — Discharge Instructions (Addendum)
Begin taking Tamiflu as prescribed.  Take Tylenol 1000 mg rotated with ibuprofen 600 mg every 4 hours as needed for fever.  Take Robitussin with codeine as prescribed as needed for cough.  Drink plenty of fluids and get plenty of rest.

## 2022-01-24 NOTE — ED Notes (Signed)
Rx x 2 given  Written and verbal inst to pt  Verbalized an understanding  To home  

## 2022-01-24 NOTE — ED Notes (Signed)
ED Provider at bedside. 

## 2022-01-24 NOTE — ED Provider Notes (Signed)
[facility] Provider Note   CSN: [id number] Arrival date & time: 01/24/22  0439     History  Chief Complaint  Patient presents with   Cough    Christine Mcdowell is a 51 y.o. female.  Patient is a 51 year old female with past medical history of type 2 diabetes, lupus, cardiomyopathy.  Patient presenting today for evaluation of cough.  This started several days ago.  She initially reports a dry cough which then became a "wet cough".  She denies fevers at home but is febrile here with a temperature of 100.2.  She denies chest pain or leg swelling.  The history is provided by the patient.  Cough Cough characteristics:  Productive Sputum characteristics:  Yellow Severity:  Moderate Onset quality:  Gradual Duration:  5 days Timing:  Constant Progression:  Worsening Chronicity:  New Relieved by:  Nothing Worsened by:  Nothing Ineffective treatments:  None tried     Home Medications Prior to Admission medications   Medication Sig Start Date End Date Taking? Authorizing Provider  amoxicillin-clavulanate (AUGMENTIN) 875-125 MG tablet Take 1 tablet by mouth 2 (two) times daily. 11/23/21   Rigoberto Noel, MD  aspirin 81 MG chewable tablet Chew 162 mg by mouth daily. Taking 2 tablets    [provider]  atorvastatin (LIPITOR) 10 MG tablet Take 1 tablet (10 mg total) by mouth daily. 04/11/21   [name], MD  benzonatate (TESSALON PERLES) 100 MG capsule Take 1 capsule (100 mg total) by mouth 3 (three) times daily as needed for cough. Patient not taking: Reported on 11/23/2021 11/06/21 11/06/22  Bary Castilla, NP  budesonide-formoterol (SYMBICORT) 80-4.5 MCG/ACT inhaler Inhale 2 puffs into the lungs 2 (two) times daily. in the morning and at bedtime. Patient taking differently: Inhale 2 puffs into the lungs 2 (two) times daily. in the morning and at bedtime as needed 09/19/21   [name], FNP  carvedilol (COREG) 25 MG tablet TAKE ONE  TABLET BY MOUTH EVERY MORNING and TAKE ONE TABLET BY MOUTH EVERY EVENING 09/17/21   Bensimhon, Shaune Pascal, MD  Continuous Blood Gluc Receiver (DEXCOM G6 RECEIVER) DEVI 1 Device by Does not apply route 3 (three) times daily before meals. 04/12/20   [name], FNP  Continuous Blood Gluc Sensor (DEXCOM G6 SENSOR) MISC Inject 1 each into the skin 3 (three) times daily. 04/12/20   [name], FNP  Continuous Blood Gluc Transmit (DEXCOM G6 TRANSMITTER) MISC 1 Device by Does not apply route 3 (three) times daily before meals. 04/12/20   [name], FNP  doxycycline (VIBRAMYCIN) 100 MG capsule Take 1 capsule (100 mg total) by mouth 2 (two) times daily. 11/05/21   Luna Fuse, MD  folic acid (FOLVITE) 1 MG tablet TAKE 1 TABLET BY MOUTH EVERY MORNING 01/30/17   Bensimhon, Shaune Pascal, MD  Glucagon (GVOKE HYPOPEN 2-PACK) 1 MG/0.2ML SOAJ Inject 1 mg into the skin as needed. 01/23/21   [name], FNP  guaiFENesin-dextromethorphan (ROBITUSSIN DM) 100-10 MG/5ML syrup Take 5 mLs by mouth every 4 (four) hours as needed for cough. Patient not taking: Reported on 11/23/2021 11/06/21   Bary Castilla, NP  hydrALAZINE (APRESOLINE) 25 MG tablet Take 1 tablet (25 mg total) by mouth 3 (three) times daily. Please schedule appointment for future refills. Thank you 09/17/21   Evans Lance, MD  hydroxychloroquine (PLAQUENIL) 200 MG tablet Take 1 tablet (200 mg total) by mouth 2 (two) times daily. 02/02/19   Bensimhon, Shaune Pascal, MD  insulin degludec (  TRESIBA FLEXTOUCH) 100 UNIT/ML FlexTouch Pen Inject 10 Units into the skin daily. 12/13/21   [name], FNP  insulin lispro (HUMALOG KWIKPEN) 100 UNIT/ML KwikPen Use according to sliding scale 12/13/21   [name], FNP  Insulin Pen Needle (NOVOFINE PLUS PEN NEEDLE) 32G X 4 MM MISC Use with insulin pens dx code e11.65 11/13/21   [name], FNP  Ipratropium-Albuterol (COMBIVENT RESPIMAT) 20-100 MCG/ACT AERS respimat Inhale 1 puff into the lungs every 6 (six) hours.  10/11/21   [name], FNP  ipratropium-albuterol (DUONEB) 0.5-2.5 (3) MG/3ML SOLN Take 3 mLs by nebulization every 6 (six) hours as needed. 10/11/21   [name], FNP  Magnesium 400 MG TABS Take 1 tablet by mouth daily.     [provider]  meloxicam (MOBIC) 15 MG tablet Take 15 mg by mouth daily.  Patient not taking: Reported on 11/23/2021    [provider]  metFORMIN (GLUCOPHAGE) 500 MG tablet TAKE ONE TABLET BY MOUTH TWICE DAILY 10/19/21   [name], FNP  methotrexate (RHEUMATREX) 2.5 MG tablet Take 20 mg by mouth every Friday.  02/06/15   [provider]  metolazone (ZAROXOLYN) 2.5 MG tablet TAKE 1 TABLET BY MOUTH AS NEEDED FOR WEIGHT GAIN OF 5 POUNDS WITHIN 3 DAYS AS DIRECTED needs appt for further refills 03/16/21   Bensimhon, Shaune Pascal, MD  montelukast (SINGULAIR) 10 MG tablet TAKE ONE TABLET BY MOUTH EVERY MORNING Patient not taking: Reported on 11/23/2021 10/19/21   [name], FNP  Multiple Vitamin (MULTIVITAMIN WITH MINERALS) TABS Take 1 tablet by mouth every morning.     [provider]  ondansetron (ZOFRAN-ODT) 4 MG disintegrating tablet DISSOLVE ONE TABLET UNDER THE TONGUE EVERY TWELVE HOURS AS NEEDED 04/11/21   [name], MD  ondansetron (ZOFRAN-ODT) 8 MG disintegrating tablet Take 1 tablet (8 mg total) by mouth every 8 (eight) hours as needed for nausea or vomiting. 12/24/21   Lajean Saver, MD  PARoxetine (PAXIL) 10 MG tablet Take 1 tablet (10 mg total) by mouth every morning. 11/13/21   [name], FNP  potassium chloride (KLOR-CON M) 10 MEQ tablet Take 3 tablets (30 mEq total) by mouth 2 (two) times daily. 12/13/21   [name], FNP  potassium chloride SA (KLOR-CON M) 20 MEQ tablet One po bid x 3 days, then one po once a day 12/24/21   Lajean Saver, MD  predniSONE (STERAPRED UNI-PAK 21 TAB) 10 MG (21) TBPK tablet Use as directed 11/06/21   Bary Castilla, NP  PROAIR HFA 108 (90 Base) MCG/ACT inhaler INHALE 2 PUFFS BY MOUTH  EVERY DAY AS NEEDED FOR SHORTNESS OF BREATH 03/07/20   [name], FNP  sacubitril-valsartan (ENTRESTO) 97-103 MG Take 1 tablet by mouth 2 (two) times daily. 11/13/21   Bensimhon, Shaune Pascal, MD  spironolactone (ALDACTONE) 25 MG tablet TAKE ONE TABLET BY MOUTH EVERY MORNING and TAKE ONE TABLET BY MOUTH EVERY EVENING 01/16/22   Bensimhon, Shaune Pascal, MD  torsemide (DEMADEX) 20 MG tablet Take 40 mg by mouth daily as needed. Patient not taking: Reported on 11/23/2021    [provider]  traZODone (DESYREL) 50 MG tablet Take 50 mg by mouth at bedtime as needed. 07/05/21   [provider]  Vitamin D, Ergocalciferol, (DRISDOL) 1.25 MG (50000 UNIT) CAPS capsule TAKE ONE CAPSULE BY MOUTH ONCE WEEKLY ON TUESDAY and TAKE ONE CAPSULE BY MOUTH ONCE WEEKLY ON FRIDAY 01/17/22   [name], FNP      Allergies    Hydrocodone-acetaminophen, Hydromorphone, Iodinated contrast  media, Other, Erythromycin, Latex, Rinvoq [upadacitinib], Farxiga [dapagliflozin], Tape, and Mircette [desogestrel-ethinyl estradiol]    Review of Systems   Review of Systems  Respiratory:  Positive for cough.   All other systems reviewed and are negative.  Physical Exam Updated Vital Signs BP (!) 156/96 (BP Location: Right Arm)    Pulse (!) 107    Temp 100.2 F (37.9 C) (Oral)    Resp (!) 28    Ht 5\' 6"  (1.676 m)    Wt 89.8 kg    SpO2 100%    BMI 31.96 kg/m  Physical Exam Vitals and nursing note reviewed.  Constitutional:      General: She is not in acute distress.    Appearance: She is well-developed. She is not diaphoretic.  HENT:     Head: Normocephalic and atraumatic.  Cardiovascular:     Rate and Rhythm: Normal rate and regular rhythm.     Heart sounds: No murmur heard.   No friction rub. No gallop.  Pulmonary:     Effort: Pulmonary effort is normal. No respiratory distress.     Breath sounds: Normal breath sounds. No wheezing.  Abdominal:     General: Bowel sounds are normal. There is no distension.      Palpations: Abdomen is soft.     Tenderness: There is no abdominal tenderness.  Musculoskeletal:        General: Normal range of motion.     Cervical back: Normal range of motion and neck supple.  Skin:    General: Skin is warm and dry.  Neurological:     General: No focal deficit present.     Mental Status: She is alert and oriented to person, place, and time.    ED Results / Procedures / Treatments   Labs (all labs ordered are listed, but only abnormal results are displayed) Labs Reviewed - No data to display  EKG None  Radiology No results found.  Procedures Procedures    Medications Ordered in ED Medications - No data to display  ED Course/ Medical Decision Making/ A&P  Patient presenting with URI symptoms as described in the HPI.  She has low-grade fever, body aches, and cough.  Influenza A swab is positive.  Patient will be treated with Tamiflu, Robitussin with codeine, and is to follow-up with primary doctor if not improving.  Vital signs are stable with no hypoxia and she is clinically well-appearing.  Final Clinical Impression(s) / ED Diagnoses Final diagnoses:  None    Rx / DC Orders ED Discharge Orders     None         Veryl Speak, MD 01/24/22 [phone]

## 2022-02-03 NOTE — Progress Notes (Signed)
Advanced Heart Failure Clinic Note   Date:  02/04/2022   ID:  Christine Mcdowell, DOB April 13, 1971, MRN [id number]  Location: Home  Provider location: [facility] Type of Visit: Established patient  PCP:  [name], FNP  EP: Dr. 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    Admitted in October 2017 with increased dyspnea and volume overload. Diuresed with IV lasix and transitioned to lasix 40 mg twice a day. L/RHC normal filling pressures, EF ~20%, and normal coronaries. Discharge weight 184 pounds.   Cardiac MRI in 02/2015 showed EF 46%. No LGE.   Echo 8/21: EF ~ 40%-45%, Grade II DD, moderate MR. Echo 10/09/21 EF 45-50% mild MR   CPX 9/19 with relaltively normal spirometry. Mild HF limitation.   She was seen in ED for cough on 02/02. Had low grade temp. Tested positive for influenza A and treated with Tamiflu.   She is here today for HF f/u. Reports ongoing cough, fatigue and dyspnea since the flu. No CP. Weight had gone up to 202 lb. She took Torsemide several days and a dose of metolazone. Home weight now down to 196 lb. Reports prior baseline weight 192 lb. Leg edema has resolved. BP typically 120/80 at home, on low end today  Has not completed sleep study.  Device interrogation: Fluid index < threshold. Heart Logic index 3. No shocks. No VT/AF. Personally reviewed   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   Anemia    Anginal pain (HCC)    Asthma    Cervical cancer ([place])    cervical 1996   CHF (congestive heart failure) ([place])    Diabetes mellitus without complication  ([place])    steroid induced   Discoid lupus    Fibromyalgia    History of blood transfusion "several"   "related to anemia; had some w/hysterectomy also"   Hx of cardiovascular stress test    ETT-Myoview (9/15):  No ischemia, EF 52%; NORMAL   Hx of echocardiogram    Echo (9/15):  EF 50-55%, ant HK, Gr 1 DD, mild MR, mild LAE, no effusion   Hypertension    Iron deficiency anemia    h/o iron transfusions   Lupus (systemic lupus erythematosus) ([place])    Migraine    "a few/year" (07/03/2016)   Pneumonia 12/2015   RA (rheumatoid arthritis) ([place])    "all over" (07/03/2016)   Sickle cell trait ([place])    Stroke ([place]) 2014 X 1; 2015 X 2; 2016 X 1;    "right side of face more relaxed than the other; rare speech hesitation" (07/03/2016)   Vaginal Pap smear, abnormal    ASCUS; HPV   Past Surgical History:  Procedure Laterality Date   ABDOMINAL HYSTERECTOMY  2009   ABDOMINAL WOUND DEHISCENCE  2009   BUNIONECTOMY Left 06/15/2020   CARDIAC CATHETERIZATION N/A 10/11/2016   Procedure: Right/Left Heart Cath and Coronary Angiography;  Surgeon: Jolaine Artist, MD;  Location: [facility];  Service: Cardiovascular;  Laterality: N/A;   DILATION AND CURETTAGE OF UTERUS  1991   HEMATOMA EVACUATION  2009   abdomen   ICD IMPLANT  01/28/2018   ICD IMPLANT  N/A 01/28/2018   Procedure: ICD IMPLANT;  Surgeon: Evans Lance, MD;  Location: [facility];  Service: Cardiovascular;  Laterality: N/A;   INCISE AND DRAIN ABCESS  2009 X 2   "abdomen after hysterectomy"   KNEE ARTHROSCOPY Right 1997   KNEE SURGERY Right    TUBAL LIGATION  1996   Current Outpatient Medications  Medication Sig Dispense Refill   aspirin 81 MG chewable tablet Chew 162 mg by mouth daily. Taking 2 tablets     atorvastatin (LIPITOR) 10 MG tablet Take 1 tablet (10 mg total) by mouth daily. 90 tablet 2   carvedilol (COREG) 25 MG tablet TAKE ONE TABLET BY MOUTH EVERY MORNING and TAKE ONE TABLET BY MOUTH EVERY EVENING 60 tablet  6   Continuous Blood Gluc Receiver (DEXCOM G6 RECEIVER) DEVI 1 Device by Does not apply route 3 (three) times daily before meals. 1 each 1   Continuous Blood Gluc Sensor (DEXCOM G6 SENSOR) MISC Inject 1 each into the skin 3 (three) times daily. 3 each 1   Continuous Blood Gluc Transmit (DEXCOM G6 TRANSMITTER) MISC 1 Device by Does not apply route 3 (three) times daily before meals. 1 each 1   folic acid (FOLVITE) 1 MG tablet TAKE 1 TABLET BY MOUTH EVERY MORNING 30 tablet 0   Glucagon (GVOKE HYPOPEN 2-PACK) 1 MG/0.2ML SOAJ Inject 1 mg into the skin as needed. 0.2 mL 5   hydrALAZINE (APRESOLINE) 25 MG tablet Take 1 tablet (25 mg total) by mouth 3 (three) times daily. Please schedule appointment for future refills. Thank you 270 tablet 3   hydroxychloroquine (PLAQUENIL) 200 MG tablet Take 1 tablet (200 mg total) by mouth 2 (two) times daily. 180 tablet 0   insulin degludec (TRESIBA FLEXTOUCH) 100 UNIT/ML FlexTouch Pen Inject 10 Units into the skin daily. 9 mL 1   insulin lispro (HUMALOG KWIKPEN) 100 UNIT/ML KwikPen Use according to sliding scale 15 mL 11   Insulin Pen Needle (NOVOFINE PLUS PEN NEEDLE) 32G X 4 MM MISC Use with insulin pens dx code e11.65 300 each 3   Ipratropium-Albuterol (COMBIVENT RESPIMAT) 20-100 MCG/ACT AERS respimat Inhale 1 puff into the lungs every 6 (six) hours. 4 g 5   ipratropium-albuterol (DUONEB) 0.5-2.5 (3) MG/3ML SOLN Take 3 mLs by nebulization every 6 (six) hours as needed. 360 mL 2   Magnesium 400 MG TABS Take 1 tablet by mouth daily.      metFORMIN (GLUCOPHAGE) 500 MG tablet TAKE ONE TABLET BY MOUTH TWICE DAILY 180 tablet 1   methotrexate (RHEUMATREX) 2.5 MG tablet Take 20 mg by mouth every Friday.   3   metolazone (ZAROXOLYN) 2.5 MG tablet TAKE 1 TABLET BY MOUTH AS NEEDED FOR WEIGHT GAIN OF 5 POUNDS WITHIN 3 DAYS AS DIRECTED needs appt for further refills 5 tablet 3   Multiple Vitamin (MULTIVITAMIN WITH MINERALS) TABS Take 1 tablet by mouth every morning.       ondansetron (ZOFRAN-ODT) 8 MG disintegrating tablet Take 1 tablet (8 mg total) by mouth every 8 (eight) hours as needed for nausea or vomiting. 10 tablet 0   PARoxetine (PAXIL) 10 MG tablet Take 1 tablet (10 mg total) by mouth every morning. 30 tablet 2   potassium chloride (KLOR-CON M) 10 MEQ tablet Take 3 tablets (30 mEq total) by mouth 2 (two) times daily. 180 tablet 3   PREDNISONE PO Take 7.5 mg by mouth daily.     PROAIR HFA 108 (90 Base) MCG/ACT inhaler INHALE 2 PUFFS BY  MOUTH EVERY DAY AS NEEDED FOR SHORTNESS OF BREATH 18 g 2   sacubitril-valsartan (ENTRESTO) 97-103 MG Take 1 tablet by mouth 2 (two) times daily. 180 tablet 3   spironolactone (ALDACTONE) 25 MG tablet TAKE ONE TABLET BY MOUTH EVERY MORNING and TAKE ONE TABLET BY MOUTH EVERY EVENING 60 tablet 3   torsemide (DEMADEX) 20 MG tablet Take 40 mg by mouth daily as needed.     traZODone (DESYREL) 50 MG tablet Take 50 mg by mouth at bedtime as needed.     Vitamin D, Ergocalciferol, (DRISDOL) 1.25 MG (50000 UNIT) CAPS capsule Take 50,000 Units by mouth 2 (two) times a week.     No current facility-administered medications for this encounter.   Allergies:   Hydrocodone-acetaminophen, Hydromorphone, Iodinated contrast media, Other, Erythromycin, Latex, Rinvoq [upadacitinib], Farxiga [dapagliflozin], Tape, and Mircette [desogestrel-ethinyl estradiol]   Social History:  The patient  reports that she has never smoked. She has never used smokeless tobacco. She reports that she does not drink alcohol and does not use drugs.   Family History:  The patient's family history includes Allergies in her father and mother; Arthritis in her mother; Asthma in her maternal grandmother; Cancer in her paternal grandfather; Cushing syndrome in her father; Dementia in her paternal grandmother; Depression in her father; Diabetes in her maternal grandmother; Drug abuse in her mother; Heart attack in her father and maternal grandfather; Heart murmur in her  mother; Hypertension in her maternal grandmother.   ROS:  Please see the history of present illness.   All other systems are personally reviewed and negative.   Recent Labs: 10/06/2021: B Natriuretic Peptide 40.3 10/11/2021: ALT 15 12/24/2021: BUN 19; Creatinine, Ser 1.12; Hemoglobin 10.8; Platelets 207; Potassium 2.9; Sodium 137  Personally reviewed   Wt Readings from Last 3 Encounters:  02/04/22 89.2 kg  01/24/22 89.8 kg  12/24/21 87.1 kg    BP (!) 98/50    Pulse 84    Wt 89.2 kg    SpO2 97%    BMI 31.73 kg/m   PHYSICAL EXAM: General:  Well appearing. No resp difficulty HEENT: normal Neck: supple. no JVD. Carotids 2+ bilat; no bruits. No lymphadenopathy or thryomegaly appreciated. Cor: PMI nondisplaced. Regular rate & rhythm. No rubs, gallops or murmurs. Lungs: clear Abdomen: soft, nontender, nondistended. No hepatosplenomegaly. No bruits or masses. Good bowel sounds. Extremities: no cyanosis, clubbing, rash, edema Neuro: alert & orientedx3, cranial nerves grossly intact. moves all 4 extremities w/o difficulty. Affect pleasant   Device Interrogation: HL Score 3. Activity ~ 3 hrs/day. No shocks Personally reviewed  ASSESSMENT AND PLAN:  1. Chronic Systolic Heart Failure - NICM. Cath 10/11/16 with normal cors.  - cMRI in 02/2015 showed EF 46%. No LGE. - CPX 10/2016: Peak VO2: 18.5 (82% predicted peak VO2), VE/VCO2 slope: 30.  - Echo 12/18 EF 25-30%  - S/P [employer] HeartLogic ICD 01/2018.  - Repeat CPX 9/19 with mild HF limitation. - Echo 8/21: EF ~ 40%-45%, Grade II DD, moderate MR. - Echo 10/09/21 EF 45-50% mild MR  - Stable NYHA II-III. Volume good on exam and HL Score. - Continue torsemide 40 mg PRN. Discussed daily wts  Continue prn metolazone - Continue spiro 25 mg bid. - Continue Coreg 25 mg bid. - Continue Entresto 97/103 mg bid.  - Off Farxiga due to recurrent yeast infections. - Continue hydralazine 25 mg tid. Not on Imdur due to headaches.  - EF out of  range for barostim device.  - Labs today -  Echo in 6 months  2. HTN  - Controlled. On low end today. May be due to recent diuretics. - Now off amlodipine. Did not tolerate due to low BP  - GDMT per above  - Check labs today  3. Sinus tach vs atrial flutter on ICD - Zio patch 9/20 Rare PVCs and PACs. - denies palpitations.   4. OSA - Home sleep study reordered  5. DM2  - Per PCP. She is using a Dexcom. - Off Farxiga due to recurrent yeast infections.  6. Hypokalemia - check labs today  7. Lupus - on prednisone and Plaquenil   Glori Bickers, MD  11:18 AM  Advanced Heart Failure [address] [place] [address] [phone] (office) [phone] (fax)

## 2022-02-04 ENCOUNTER — Encounter (HOSPITAL_COMMUNITY): Payer: Self-pay | Admitting: Internal Medicine

## 2022-02-04 ENCOUNTER — Ambulatory Visit (HOSPITAL_COMMUNITY)
Admission: RE | Admit: 2022-02-04 | Discharge: 2022-02-04 | Disposition: A | Discharge status: Home / Self Care | Payer: Medicare Other | Source: Ambulatory Visit | Attending: Internal Medicine | Admitting: Internal Medicine

## 2022-02-04 ENCOUNTER — Other Ambulatory Visit: Payer: Self-pay

## 2022-02-04 ENCOUNTER — Ambulatory Visit (INDEPENDENT_AMBULATORY_CARE_PROVIDER_SITE_OTHER): Payer: Medicare Other

## 2022-02-04 VITALS — BP 98/50 | HR 84 | Wt 196.6 lb

## 2022-02-04 DIAGNOSIS — M329 Systemic lupus erythematosus, unspecified: Secondary | ICD-10-CM | POA: Diagnosis not present

## 2022-02-04 DIAGNOSIS — Z95810 Presence of automatic (implantable) cardiac defibrillator: Secondary | ICD-10-CM | POA: Diagnosis not present

## 2022-02-04 DIAGNOSIS — E876 Hypokalemia: Secondary | ICD-10-CM | POA: Insufficient documentation

## 2022-02-04 DIAGNOSIS — Z7952 Long term (current) use of systemic steroids: Secondary | ICD-10-CM | POA: Diagnosis not present

## 2022-02-04 DIAGNOSIS — Z09 Encounter for follow-up examination after completed treatment for conditions other than malignant neoplasm: Secondary | ICD-10-CM | POA: Diagnosis not present

## 2022-02-04 DIAGNOSIS — I5022 Chronic systolic (congestive) heart failure: Secondary | ICD-10-CM

## 2022-02-04 DIAGNOSIS — Z79899 Other long term (current) drug therapy: Secondary | ICD-10-CM | POA: Insufficient documentation

## 2022-02-04 DIAGNOSIS — M797 Fibromyalgia: Secondary | ICD-10-CM | POA: Diagnosis not present

## 2022-02-04 DIAGNOSIS — E785 Hyperlipidemia, unspecified: Secondary | ICD-10-CM | POA: Diagnosis not present

## 2022-02-04 DIAGNOSIS — I110 Hypertensive heart disease with heart failure: Secondary | ICD-10-CM | POA: Diagnosis not present

## 2022-02-04 DIAGNOSIS — Z8673 Personal history of transient ischemic attack (TIA), and cerebral infarction without residual deficits: Secondary | ICD-10-CM | POA: Insufficient documentation

## 2022-02-04 DIAGNOSIS — Z7901 Long term (current) use of anticoagulants: Secondary | ICD-10-CM | POA: Insufficient documentation

## 2022-02-04 DIAGNOSIS — E119 Type 2 diabetes mellitus without complications: Secondary | ICD-10-CM | POA: Insufficient documentation

## 2022-02-04 DIAGNOSIS — I428 Other cardiomyopathies: Secondary | ICD-10-CM | POA: Diagnosis not present

## 2022-02-04 LAB — BASIC METABOLIC PANEL
Anion gap: 8 (ref 5–15)
BUN: 11 mg/dL (ref 6–20)
CO2: 26 mmol/L (ref 22–32)
Calcium: 9.3 mg/dL (ref 8.9–10.3)
Chloride: 106 mmol/L (ref 98–111)
Creatinine, Ser: 1.04 mg/dL — ABNORMAL HIGH (ref 0.44–1.00)
GFR, Estimated: >60 mL/min (ref >60)
Glucose, Bld: 124 mg/dL — ABNORMAL HIGH (ref 70–99)
Potassium: 3.4 mmol/L — ABNORMAL LOW (ref 3.5–5.1)
Sodium: 140 mmol/L (ref 135–145)

## 2022-02-04 LAB — CBC
HCT: 34.2 % — ABNORMAL LOW (ref 36.0–46.0)
Hemoglobin: 11 g/dL — ABNORMAL LOW (ref 12.0–15.0)
MCH: 27.4 pg (ref 26.0–34.0)
MCHC: 32.2 g/dL (ref 30.0–36.0)
MCV: 85.1 fL (ref 80.0–100.0)
Platelets: 229 10*3/uL (ref 150–400)
RBC: 4.02 MIL/uL (ref 3.87–5.11)
RDW: 13.8 % (ref 11.5–15.5)
WBC: 4.6 10*3/uL (ref 4.0–10.5)
nRBC: 0 % (ref 0.0–0.2)

## 2022-02-04 NOTE — Patient Instructions (Addendum)
Medication Changes:  No changes   Lab Work:  Labs done today, your results will be available in MyChart, we will contact you for abnormal readings.   Testing/Procedures:  PLEASE COMPLETE YOUR SLEEP STUDY  Your physician has requested that you have an echocardiogram. Echocardiography is a painless test that uses sound waves to create images of your heart. It provides your doctor with information about the size and shape of your heart and how well your hearts chambers and valves are working. This procedure takes approximately one hour. There are no restrictions for this procedure.   Referrals:  NONE  Special Instructions // Education:  Please follow up and complete home sleep study  Follow-Up in: 6 months (August 2023) ** please call the office in June 2023 for follow up appointment **  At the [facility], you and your health needs are our priority. We have a designated team specialized in the treatment of Heart Failure. This Care Team includes your primary Heart Failure Specialized Cardiologist (physician), Advanced Practice Providers (APPs- Physician Assistants and Nurse Practitioners), and Pharmacist who all work together to provide you with the care you need, when you need it.   You may see any of the following providers on your designated Care Team at your next follow up:  Dr Glori Bickers Dr Haynes Kerns, NP Lyda Jester, [place] [facility] [place], [place] Audry Riles, PharmD   Please be sure to bring in all your medications bottles to every appointment.   Need to Contact us:  If you have any questions or concerns before your next appointment please send [place] a message through [place] or call our office at [phone].    TO LEAVE A MESSAGE FOR THE NURSE SELECT OPTION 2, PLEASE LEAVE A MESSAGE INCLUDING: YOUR NAME DATE OF BIRTH CALL BACK NUMBER REASON FOR CALL**this is important as we prioritize the call backs  YOU WILL  RECEIVE A CALL BACK THE SAME DAY AS LONG AS YOU CALL BEFORE 4:00 PM

## 2022-02-04 NOTE — Addendum Note (Signed)
Encounter addended by: Micki Riley, RN on: 02/04/2022 11:54 AM  Actions taken: Clinical Note Signed

## 2022-02-04 NOTE — Progress Notes (Addendum)
Patient completed home sleep study in September and it did not have sufficient data for adequate interpretation.  She was called several times 11/05/2021 and 12/07/2021 to return to pick up second device to complete a new study.  She was given the second device today during clinic appt in order to repeat the study.

## 2022-02-05 ENCOUNTER — Ambulatory Visit (INDEPENDENT_AMBULATORY_CARE_PROVIDER_SITE_OTHER): Payer: Medicare Other

## 2022-02-05 ENCOUNTER — Telehealth: Payer: Medicaid Other

## 2022-02-05 ENCOUNTER — Telehealth: Payer: Self-pay

## 2022-02-05 DIAGNOSIS — M069 Rheumatoid arthritis, unspecified: Secondary | ICD-10-CM

## 2022-02-05 DIAGNOSIS — R053 Chronic cough: Secondary | ICD-10-CM

## 2022-02-05 DIAGNOSIS — M329 Systemic lupus erythematosus, unspecified: Secondary | ICD-10-CM

## 2022-02-05 DIAGNOSIS — I5022 Chronic systolic (congestive) heart failure: Secondary | ICD-10-CM

## 2022-02-05 DIAGNOSIS — E1165 Type 2 diabetes mellitus with hyperglycemia: Secondary | ICD-10-CM

## 2022-02-05 DIAGNOSIS — J4520 Mild intermittent asthma, uncomplicated: Secondary | ICD-10-CM

## 2022-02-05 DIAGNOSIS — I10 Essential (primary) hypertension: Secondary | ICD-10-CM

## 2022-02-05 NOTE — Chronic Care Management (AMB) (Signed)
Chronic Care Management Pharmacy Assistant   Name: Christine Mcdowell  MRN: [id number] DOB: 1971-03-05   Reason for Encounter: No fill report  Medications: Outpatient Encounter Medications as of 02/05/2022  Medication Sig   aspirin 81 MG chewable tablet Chew 162 mg by mouth daily. Taking 2 tablets   atorvastatin (LIPITOR) 10 MG tablet Take 1 tablet (10 mg total) by mouth daily.   carvedilol (COREG) 25 MG tablet TAKE ONE TABLET BY MOUTH EVERY MORNING and TAKE ONE TABLET BY MOUTH EVERY EVENING   Continuous Blood Gluc Receiver (DEXCOM G6 RECEIVER) DEVI 1 Device by Does not apply route 3 (three) times daily before meals.   Continuous Blood Gluc Sensor (DEXCOM G6 SENSOR) MISC Inject 1 each into the skin 3 (three) times daily.   Continuous Blood Gluc Transmit (DEXCOM G6 TRANSMITTER) MISC 1 Device by Does not apply route 3 (three) times daily before meals.   folic acid (FOLVITE) 1 MG tablet TAKE 1 TABLET BY MOUTH EVERY MORNING   Glucagon (GVOKE HYPOPEN 2-PACK) 1 MG/0.2ML SOAJ Inject 1 mg into the skin as needed.   hydrALAZINE (APRESOLINE) 25 MG tablet Take 1 tablet (25 mg total) by mouth 3 (three) times daily. Please schedule appointment for future refills. Thank you   hydroxychloroquine (PLAQUENIL) 200 MG tablet Take 1 tablet (200 mg total) by mouth 2 (two) times daily.   insulin degludec (TRESIBA FLEXTOUCH) 100 UNIT/ML FlexTouch Pen Inject 10 Units into the skin daily.   insulin lispro (HUMALOG KWIKPEN) 100 UNIT/ML KwikPen Use according to sliding scale   Insulin Pen Needle (NOVOFINE PLUS PEN NEEDLE) 32G X 4 MM MISC Use with insulin pens dx code e11.65   Ipratropium-Albuterol (COMBIVENT RESPIMAT) 20-100 MCG/ACT AERS respimat Inhale 1 puff into the lungs every 6 (six) hours.   ipratropium-albuterol (DUONEB) 0.5-2.5 (3) MG/3ML SOLN Take 3 mLs by nebulization every 6 (six) hours as needed.   Magnesium 400 MG TABS Take 1 tablet by mouth daily.    metFORMIN (GLUCOPHAGE) 500 MG tablet TAKE ONE  TABLET BY MOUTH TWICE DAILY   methotrexate (RHEUMATREX) 2.5 MG tablet Take 20 mg by mouth every Friday.    metolazone (ZAROXOLYN) 2.5 MG tablet TAKE 1 TABLET BY MOUTH AS NEEDED FOR WEIGHT GAIN OF 5 POUNDS WITHIN 3 DAYS AS DIRECTED needs appt for further refills   Multiple Vitamin (MULTIVITAMIN WITH MINERALS) TABS Take 1 tablet by mouth every morning.    ondansetron (ZOFRAN-ODT) 8 MG disintegrating tablet Take 1 tablet (8 mg total) by mouth every 8 (eight) hours as needed for nausea or vomiting.   PARoxetine (PAXIL) 10 MG tablet Take 1 tablet (10 mg total) by mouth every morning.   potassium chloride (KLOR-CON M) 10 MEQ tablet Take 3 tablets (30 mEq total) by mouth 2 (two) times daily.   PREDNISONE PO Take 5 mg by mouth daily.   PROAIR HFA 108 (90 Base) MCG/ACT inhaler INHALE 2 PUFFS BY MOUTH EVERY DAY AS NEEDED FOR SHORTNESS OF BREATH   sacubitril-valsartan (ENTRESTO) 97-103 MG Take 1 tablet by mouth 2 (two) times daily.   spironolactone (ALDACTONE) 25 MG tablet TAKE ONE TABLET BY MOUTH EVERY MORNING and TAKE ONE TABLET BY MOUTH EVERY EVENING   torsemide (DEMADEX) 20 MG tablet Take 40 mg by mouth daily as needed.   traZODone (DESYREL) 50 MG tablet Take 50 mg by mouth at bedtime as needed.   Vitamin D, Ergocalciferol, (DRISDOL) 1.25 MG (50000 UNIT) CAPS capsule Take 50,000 Units by mouth 2 (two) times a week.  No facility-administered encounter medications on file as of 02/05/2022.   02-05-2022: Patient is on the no fill report for Singulair. On 01-11-2022 medication coordination call patient declined medication because medication is on hold per provider. Patient will inform upstream if she starts back medication. Updated report.   [place] Pharmacist Assistant [phone]

## 2022-02-06 NOTE — Patient Instructions (Signed)
Visit Information  Thank you for taking time to visit with me today. Please don't hesitate to contact me if I can be of assistance to you before our next scheduled telephone appointment.  Following are the goals we discussed today:  (Copy and paste patient goals from clinical care plan here)  Our next appointment is by telephone on 03/06/22 at 2:40 PM   Please call the care guide team at [phone] if you need to cancel or reschedule your appointment.   If you are experiencing a Mental Health or [place] or need someone to talk to, please call [phone]-TALK (toll free, 24 hour hotline)   Patient verbalizes understanding of instructions and care plan provided today and agrees to view in [place]. Active MyChart status confirmed with patient.    Barb Merino, RN, BSN, CCM Care Management Coordinator [facility]  Direct Phone: [phone]

## 2022-02-06 NOTE — Chronic Care Management (AMB) (Signed)
Chronic Care Management   CCM RN Visit Note  02/05/2022 Name: Christine Mcdowell MRN: [id number] DOB: 20-Sep-1971  Subjective: Christine Mcdowell is a 51 y.o. year old female who is a primary care patient of [name], [place]. The care management team was consulted for assistance with disease management and care coordination needs.    Engaged with patient by telephone for follow up visit in response to provider referral for case management and/or care coordination services.   Consent to Services:  The patient was given information about Chronic Care Management services, agreed to services, and gave verbal consent prior to initiation of services.  Please see initial visit note for detailed documentation.   Patient agreed to services and verbal consent obtained.   Assessment: Review of patient past medical history, allergies, medications, health status, including review of consultants reports, laboratory and other test data, was performed as part of comprehensive evaluation and provision of chronic care management services.   SDOH (Social Determinants of Health) assessments and interventions performed:  Yes, no acute needs   CCM Care Plan  Allergies  Allergen Reactions   Hydrocodone-Acetaminophen Nausea And Vomiting   Hydromorphone Nausea And Vomiting    Other reaction(s): GI Upset (intolerance), Hypertension (intolerance) Raises blood pressure  Other reaction(s): GI Upset (intolerance), Hypertension (intolerance) Raises blood pressure to stroke level   Iodinated Contrast Media Other (See Comments)    Shuts down kidneys Shuts kidney function down   Other Other (See Comments) and Anaphylaxis    Spicy foods and seasonings Skin Prep "makes my skin peel off" Paper tape causes skin burns   Erythromycin Nausea And Vomiting   Latex Hives   Rinvoq [Upadacitinib] Other (See Comments)   Farxiga [Dapagliflozin] Other (See Comments)   Tape Other (See Comments)    "Skin burns"   Mircette  [Desogestrel-Ethinyl Estradiol] Nausea And Vomiting and Rash    Outpatient Encounter Medications as of 02/05/2022  Medication Sig   Ipratropium-Albuterol (COMBIVENT RESPIMAT) 20-100 MCG/ACT AERS respimat Inhale 1 puff into the lungs every 6 (six) hours.   ipratropium-albuterol (DUONEB) 0.5-2.5 (3) MG/3ML SOLN Take 3 mLs by nebulization every 6 (six) hours as needed.   aspirin 81 MG chewable tablet Chew 162 mg by mouth daily. Taking 2 tablets   atorvastatin (LIPITOR) 10 MG tablet Take 1 tablet (10 mg total) by mouth daily.   carvedilol (COREG) 25 MG tablet TAKE ONE TABLET BY MOUTH EVERY MORNING and TAKE ONE TABLET BY MOUTH EVERY EVENING   Continuous Blood Gluc Receiver (DEXCOM G6 RECEIVER) DEVI 1 Device by Does not apply route 3 (three) times daily before meals.   Continuous Blood Gluc Sensor (DEXCOM G6 SENSOR) MISC Inject 1 each into the skin 3 (three) times daily.   Continuous Blood Gluc Transmit (DEXCOM G6 TRANSMITTER) MISC 1 Device by Does not apply route 3 (three) times daily before meals.   folic acid (FOLVITE) 1 MG tablet TAKE 1 TABLET BY MOUTH EVERY MORNING   Glucagon (GVOKE HYPOPEN 2-PACK) 1 MG/0.2ML SOAJ Inject 1 mg into the skin as needed.   hydrALAZINE (APRESOLINE) 25 MG tablet Take 1 tablet (25 mg total) by mouth 3 (three) times daily. Please schedule appointment for future refills. Thank you   hydroxychloroquine (PLAQUENIL) 200 MG tablet Take 1 tablet (200 mg total) by mouth 2 (two) times daily.   insulin degludec (TRESIBA FLEXTOUCH) 100 UNIT/ML FlexTouch Pen Inject 10 Units into the skin daily.   insulin lispro (HUMALOG KWIKPEN) 100 UNIT/ML KwikPen Use according to sliding scale   Insulin  Pen Needle (NOVOFINE PLUS PEN NEEDLE) 32G X 4 MM MISC Use with insulin pens dx code e11.65   Magnesium 400 MG TABS Take 1 tablet by mouth daily.    metFORMIN (GLUCOPHAGE) 500 MG tablet TAKE ONE TABLET BY MOUTH TWICE DAILY   methotrexate (RHEUMATREX) 2.5 MG tablet Take 20 mg by mouth every Friday.     metolazone (ZAROXOLYN) 2.5 MG tablet TAKE 1 TABLET BY MOUTH AS NEEDED FOR WEIGHT GAIN OF 5 POUNDS WITHIN 3 DAYS AS DIRECTED needs appt for further refills   Multiple Vitamin (MULTIVITAMIN WITH MINERALS) TABS Take 1 tablet by mouth every morning.    ondansetron (ZOFRAN-ODT) 8 MG disintegrating tablet Take 1 tablet (8 mg total) by mouth every 8 (eight) hours as needed for nausea or vomiting.   PARoxetine (PAXIL) 10 MG tablet Take 1 tablet (10 mg total) by mouth every morning.   potassium chloride (KLOR-CON M) 10 MEQ tablet Take 3 tablets (30 mEq total) by mouth 2 (two) times daily.   PREDNISONE PO Take 5 mg by mouth daily.   PROAIR HFA 108 (90 Base) MCG/ACT inhaler INHALE 2 PUFFS BY MOUTH EVERY DAY AS NEEDED FOR SHORTNESS OF BREATH   sacubitril-valsartan (ENTRESTO) 97-103 MG Take 1 tablet by mouth 2 (two) times daily.   spironolactone (ALDACTONE) 25 MG tablet TAKE ONE TABLET BY MOUTH EVERY MORNING and TAKE ONE TABLET BY MOUTH EVERY EVENING   torsemide (DEMADEX) 20 MG tablet Take 40 mg by mouth daily as needed.   traZODone (DESYREL) 50 MG tablet Take 50 mg by mouth at bedtime as needed.   Vitamin D, Ergocalciferol, (DRISDOL) 1.25 MG (50000 UNIT) CAPS capsule Take 50,000 Units by mouth 2 (two) times a week.   No facility-administered encounter medications on file as of 02/05/2022.    Patient Active Problem List   Diagnosis Date Noted   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   History of COVID-19 04/12/2020   COVID-19 virus infection 03/07/2020   Lower abdominal pain 56/43/3295   Chronic systolic (congestive) heart failure ([place]) 01/28/2018   Cough 01/19/2018   Pulmonary infiltrates on CXR 01/19/2018   Migraines 10/08/2017   Sleep apnea with use of continuous positive airway pressure (CPAP) 09/24/2017   Rheumatoid arthritis ([place]) 10/09/2016   Asthma 10/09/2016   Chronic pain 10/09/2016   Heart failure ([place]) 10/09/2016   Congestive heart failure ([place])    Lupus  (systemic lupus erythematosus) ([place])    Diabetes mellitus with complication (HCC)    Anxiety state    GERD (gastroesophageal reflux disease) 12/28/2015   Essential hypertension 12/26/2015   Type 2 diabetes mellitus ([place]) 18/84/1660   Chronic systolic heart failure ([place]) 08/18/2014   Cardiomyopathy, 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[place]) 01/27/2014   Shortness of breath 01/26/2014   SLE (systemic lupus erythematosus) ([place]) 01/26/2014    Conditions to be addressed/monitored: CHF, DM II, HTN, SLE, RA, Asthma   Care Plan : RN Care Manager Plan of Care  Updates made by [name], RN since 02/05/2022 12:00 AM     Problem: Chronic disease education and Chronic Care Coordination needs for CHF, DM II, HTN, SLE, RA, Asthma   Priority: High     Long-Range Goal: Assist with Chronic disease education and Care Coordination needs for CHF, DM II, HTN, SLE, RA, Asthma   Start Date: 10/16/2021  Expected End Date: 10/16/2022  Recent Progress: On track  Priority: High  Note:   Current Barriers:  Knowledge Deficits related to plan of care for management of CHF, DM II,  HTN, SLE, RA, Asthma  Chronic Disease Management support and education needs related to CHF, DM II, HTN, SLE, RA, Asthma   RNCM Clinical Goal(s):  Patient will demonstrate Ongoing health management independence   continue to work with RN Care Manager to address care management and care coordination needs related to  CHF, DM II, HTN, SLE, RA, Asthma  will demonstrate ongoing self health care management ability    through collaboration with RN Care manager, provider, and care team.   Interventions: 1:1 collaboration with primary care provider regarding development and update of comprehensive plan of care as evidenced by provider attestation and co-signature Inter-disciplinary care team collaboration (see longitudinal plan of care) Evaluation of current treatment plan related to  self management and patient's adherence to plan as established by  provider  Hypertension Interventions:  (Status:  Goal on track:  Yes.) Long Term Goal Last practice recorded BP readings:  BP Readings from Last 3 Encounters:  02/04/22 (!) 98/50  01/24/22 139/81  12/24/21 116/62  Most recent eGFR/CrCl:  Lab Results  Component Value Date   EGFR 73 10/11/2021    No components found for: CRCL Evaluation of current treatment plan related to hypertension self management and patient's adherence to plan as established by provider Review of patient status, including review of consultant's reports, relevant laboratory and other test results, and medications completed Reviewed and discussed recent follow up with Cardiologist, Dr. Hayden Pedro completed on 02/04/22 with the following Assessment/Plan noted: ASSESSMENT AND PLAN:   1. Chronic Systolic Heart Failure - NICM. Cath 10/11/16 with normal cors.  - cMRI in 02/2015 showed EF 46%. No LGE. - CPX 10/2016: Peak VO2: 18.5 (82% predicted peak VO2), VE/VCO2 slope: 30.  - Echo 12/18 EF 25-30%  - S/P [employer] HeartLogic ICD 01/2018.  - Repeat CPX 9/19 with mild HF limitation. - Echo 8/21: EF ~ 40%-45%, Grade II DD, moderate MR. - Echo 10/09/21 EF 45-50% mild MR  - Stable NYHA II-III. Volume good on exam and HL Score. - Continue torsemide 40 mg PRN. Discussed daily wts  Continue prn metolazone - Continue spiro 25 mg bid. - Continue Coreg 25 mg bid. - Continue Entresto 97/103 mg bid.  - Off Farxiga due to recurrent yeast infections. - Continue hydralazine 25 mg tid. Not on Imdur due to headaches.  - EF out of range for barostim device.  - Labs today - Echo in 6 months 2. HTN  - Controlled. On low end today. May be due to recent diuretics. - Now off amlodipine. Did not tolerate due to low BP  - GDMT per above  - Check labs today 3. Sinus tach vs atrial flutter on ICD - Zio patch 9/20 Rare PVCs and PACs. - denies palpitations.  4. OSA - Home sleep study reordered 5. DM2  - Per PCP. She is using a  Dexcom. - Off Farxiga due to recurrent yeast infections. 6. Hypokalemia - check labs today 7. Lupus - on prednisone and Plaquenil  Referrals: NONE Special Instructions // Education: Please follow up and complete home sleep study Follow-Up in: 6 months (August 2023) ** please call the office in June 2023 for follow up appointment **     Reviewed medications with patient and discussed importance of compliance Advised patient, providing education and rationale, to monitor blood pressure daily and record, calling PCP for findings outside established parameters Provided education on prescribed diet low Sodium Discussed complications of poorly controlled blood pressure such as heart disease, stroke, circulatory complications, vision  complications, kidney impairment  Discussed plans with patient for ongoing care management follow up and provided patient with direct contact information for care management team  Diabetes Interventions:  (Status:  Goal on track:  Yes.) Long Term Goal Assessed patient's understanding of A1c goal: <6.5% Reviewed medications with patient and discussed importance of medication adherence Counseled on importance of regular laboratory monitoring as prescribed Advised patient, providing education and rationale, to check cbg daily before breakfast and at bedtime and record, calling PCP for findings outside established parameters Review of patient status, including review of consultants reports, relevant laboratory and other test results, and medications completed Educated patient on dietary and exercise recommendations; daily glycemic control FBS 80-130, <180 after meals;15'15' rule Reviewed scheduled/upcoming PCP provider DM follow up scheduled for 03/04/22 @2 :40 PM  Discussed plans with patient for ongoing care management follow up and provided patient with direct contact information for care management team Lab Results  Component Value Date   HGBA1C 6.4 (H) 10/11/2021    Asthma: (Status:Goal on track:  Yes.) Long Term Goal Evaluation of current treatment plan related to Asthma self management and patient's adherence to plan as established by provider Determined patient had a recent ED visit secondary to an Asthma exacerbation triggered by type A Influenza Review of patient status, including review of consultant's reports, relevant laboratory and other test results, and medications completed. Reviewed medications with patient and discussed importance of medication adherence Advised patient to self assesses Asthma action plan zone and make appointment with provider if in the yellow zone for 48 hours without improvement Advised patient to engage in light exercise as tolerated 3-5 days a week to aid in the the management of Asthma Provided education about and advised patient to utilize infection prevention strategies to reduce risk of respiratory infection Discussed the importance of adequate rest and management of fatigue with Asthma Instructed patient to keep her PCP well informed of persistent or worsening symptoms secondary to Asthma  Patient Goals/Self-Care Activities: Take all medications as prescribed Attend all scheduled provider appointments Call pharmacy for medication refills 3-7 days in advance of running out of medications Perform all self care activities independently  Call provider office for new concerns or questions  drink 6 to 8 glasses of water each day manage portion size check blood pressure daily call doctor for signs and symptoms of high blood pressure keep all doctor appointments take medications for blood pressure exactly as prescribed begin an exercise program report new symptoms to your doctor  Follow Up Plan:  Telephone follow up appointment with care management team member scheduled for:  03/06/22     Barb Merino, RN, BSN, CCM Care Management Coordinator [place] [facility]  Direct Phone:  [phone]

## 2022-02-07 NOTE — Progress Notes (Signed)
EPIC Encounter for ICM Monitoring  Patient Name: Christine Mcdowell is a 51 y.o. female Date: 02/07/2022 Primary Care Physican: [name], [place] Primary Cardiologist: [place] Electrophysiologist: Lovena Le 02/04/2022 Office Weight: 196 lbs                                                                               Transmission reviewed.  Pt had defib check in office by Dr Haroldine Laws on 2/13.    02/03/2022 HeartLogic Heart Failure Index 3 suggesting normal fluid levels.    Prescribed:  Torsemide 20 mg take 2 tablets (40 mg total) daily as needed Potassium 10 mEq Take 3 tablets (30 mEq total) by mouth 2 (two) times daily. Spironolactone 25 mg take 1 tablet twice a day. Metolazone 2.5 mg take one tablet as needed for weight gain of 5 lbs within 3 days.     Labs: 02/04/2022 Creatinine 1.04, BUN 11, Potassium 3.4, Sodium 140, GFR >60 12/24/2021 Creatinine 1.12, BUN 19, Potassium 2.9, Sodium 137, GFR 59 10/11/2021 Creatinine 0.94, BUN 19, Potassium 3.9, Sodium 142 A complete set of results can be found in Results Review.   Recommendations:  Any recommendations were given at 2/13 OV with Dr Haroldine Laws.    Follow-up plan: ICM clinic phone appointment on 03/11/2022.  91 day device clinic remote transmission 02/27/2022.           EP/Cardiology next office visit:  Recall for 03/04/2022 with Oda Kilts, [place].   02/04/2022 with Dr Haroldine Laws.        Copy of ICM check sent to Dr. Lovena Le.   3 Month Trend    8 Day Data Trend          [name], RN 02/07/2022 2:06 PM

## 2022-02-11 ENCOUNTER — Telehealth: Payer: Self-pay

## 2022-02-11 ENCOUNTER — Other Ambulatory Visit: Payer: Self-pay

## 2022-02-11 DIAGNOSIS — E1165 Type 2 diabetes mellitus with hyperglycemia: Secondary | ICD-10-CM | POA: Diagnosis not present

## 2022-02-11 DIAGNOSIS — R232 Flushing: Secondary | ICD-10-CM

## 2022-02-11 MED ORDER — VITAMIN D (ERGOCALCIFEROL) 1.25 MG (50000 UNIT) PO CAPS: 50000.0000 [IU] | ORAL_CAPSULE | ORAL | 3 refills | Status: DC

## 2022-02-11 MED ORDER — ATORVASTATIN CALCIUM 10 MG PO TABS: 10.0000 mg | ORAL_TABLET | Freq: Every day | ORAL | 2 refills | Status: DC

## 2022-02-11 MED ORDER — PAROXETINE HCL 10 MG PO TABS: 10.0000 mg | ORAL_TABLET | Freq: Every morning | ORAL | 2 refills | Status: DC

## 2022-02-11 NOTE — Chronic Care Management (AMB) (Signed)
Chronic Care Management Pharmacy Assistant   Name: Christine Mcdowell  MRN: [id number] DOB: 08-Apr-1971  Reason for Encounter: Medication Review/ Medication coordination  Recent office visits:  02-05-2022 [name], RN (CCM)  Recent consult visits:  02-04-2022 Bensimhon, Shaune Pascal, MD (Cardiology). Potassium= 3.4, Glucose= 124, Creatinine= 1.04. Hemo= 11.0, HCT= 34.2. Echocardiogram ordered. EKG completed.  01-16-2022 Victorino Dike, MD (Cancer center) A letter is being sent. Unable to view encounter.  Hospital visits:  Medication Reconciliation was completed by comparing discharge summary, patients EMR and Pharmacy list, and upon discussion with patient.  Admitted to the hospital on 01-24-2022 due to Influenza A. Discharge date was 01-24-2022. Discharged from [facility]?Medications Started at [facility] Discharge:?? Tamiflu 75 mg every 12 hours Guaifenesin/codeine take 10 mLs every 12 hours  Medication Changes at Hospital Discharge: None  Medications Discontinued at Hospital Discharge: None  Medications that remain the same after Hospital Discharge:??  -All other medications will remain the same.    Medications: Outpatient Encounter Medications as of 02/11/2022  Medication Sig   aspirin 81 MG chewable tablet Chew 162 mg by mouth daily. Taking 2 tablets   atorvastatin (LIPITOR) 10 MG tablet Take 1 tablet (10 mg total) by mouth daily.   carvedilol (COREG) 25 MG tablet TAKE ONE TABLET BY MOUTH EVERY MORNING and TAKE ONE TABLET BY MOUTH EVERY EVENING   Continuous Blood Gluc Receiver (DEXCOM G6 RECEIVER) DEVI 1 Device by Does not apply route 3 (three) times daily before meals.   Continuous Blood Gluc Sensor (DEXCOM G6 SENSOR) MISC Inject 1 each into the skin 3 (three) times daily.   Continuous Blood Gluc Transmit (DEXCOM G6 TRANSMITTER) MISC 1 Device by Does not apply route 3 (three) times daily before meals.   folic acid (FOLVITE)  1 MG tablet TAKE 1 TABLET BY MOUTH EVERY MORNING   Glucagon (GVOKE HYPOPEN 2-PACK) 1 MG/0.2ML SOAJ Inject 1 mg into the skin as needed.   hydrALAZINE (APRESOLINE) 25 MG tablet Take 1 tablet (25 mg total) by mouth 3 (three) times daily. Please schedule appointment for future refills. Thank you   hydroxychloroquine (PLAQUENIL) 200 MG tablet Take 1 tablet (200 mg total) by mouth 2 (two) times daily.   insulin degludec (TRESIBA FLEXTOUCH) 100 UNIT/ML FlexTouch Pen Inject 10 Units into the skin daily.   insulin lispro (HUMALOG KWIKPEN) 100 UNIT/ML KwikPen Use according to sliding scale   Insulin Pen Needle (NOVOFINE PLUS PEN NEEDLE) 32G X 4 MM MISC Use with insulin pens dx code e11.65   Ipratropium-Albuterol (COMBIVENT RESPIMAT) 20-100 MCG/ACT AERS respimat Inhale 1 puff into the lungs every 6 (six) hours.   ipratropium-albuterol (DUONEB) 0.5-2.5 (3) MG/3ML SOLN Take 3 mLs by nebulization every 6 (six) hours as needed.   Magnesium 400 MG TABS Take 1 tablet by mouth daily.    metFORMIN (GLUCOPHAGE) 500 MG tablet TAKE ONE TABLET BY MOUTH TWICE DAILY   methotrexate (RHEUMATREX) 2.5 MG tablet Take 20 mg by mouth every Friday.    metolazone (ZAROXOLYN) 2.5 MG tablet TAKE 1 TABLET BY MOUTH AS NEEDED FOR WEIGHT GAIN OF 5 POUNDS WITHIN 3 DAYS AS DIRECTED needs appt for further refills   Multiple Vitamin (MULTIVITAMIN WITH MINERALS) TABS Take 1 tablet by mouth every morning.    ondansetron (ZOFRAN-ODT) 8 MG disintegrating tablet Take 1 tablet (8 mg total) by mouth every 8 (eight) hours as needed for nausea or vomiting.   PARoxetine (PAXIL) 10 MG tablet Take 1 tablet (10  mg total) by mouth every morning.   potassium chloride (KLOR-CON M) 10 MEQ tablet Take 3 tablets (30 mEq total) by mouth 2 (two) times daily.   PREDNISONE PO Take 5 mg by mouth daily.   PROAIR HFA 108 (90 Base) MCG/ACT inhaler INHALE 2 PUFFS BY MOUTH EVERY DAY AS NEEDED FOR SHORTNESS OF BREATH   sacubitril-valsartan (ENTRESTO) 97-103 MG Take 1  tablet by mouth 2 (two) times daily.   spironolactone (ALDACTONE) 25 MG tablet TAKE ONE TABLET BY MOUTH EVERY MORNING and TAKE ONE TABLET BY MOUTH EVERY EVENING   torsemide (DEMADEX) 20 MG tablet Take 40 mg by mouth daily as needed.   traZODone (DESYREL) 50 MG tablet Take 50 mg by mouth at bedtime as needed.   Vitamin D, Ergocalciferol, (DRISDOL) 1.25 MG (50000 UNIT) CAPS capsule Take 50,000 Units by mouth 2 (two) times a week.   No facility-administered encounter medications on file as of 02/11/2022.   Reviewed chart for medication changes ahead of medication coordination call.  No medication changes indicated OR if recent visit, treatment plan here.  BP Readings from Last 3 Encounters:  02/04/22 (!) 98/50  01/24/22 139/81  12/24/21 116/62    Lab Results  Component Value Date   HGBA1C 6.4 (H) 10/11/2021     Patient obtains medications through Vials  30 Days   Last adherence delivery included:  Trazodone 50 mg- 1 tablet at bedtime as needed for sleep Symbicort 80/4.5 mcg- 2 puffs twice daily in morning and at bedtime Hydralazine 25 mg- 1 tab three times daily Metformin 500 mg- one tablet twice a day Atorvastatin 10 mg- one tablet daily Potassium CL ER 10 Meq- 3 tabs twice daily Paroxetine 10 mg- 1 tablet daily Diclofenac 1% gel- apply small amount to joints twice daily as needed Hydroxychloroquine 200 mg- 1 tab twice daily Insulin Lispro (Humalog)- use according to sliding scale Entresto 97 mg/103 mg- one tablet twice a day Carvedilol 25 mg- One tablet twice a day TruePlus Pen Needles 32 G-use as directed Folic acid 1 mg- daily Spironolactone 25 mg- one tablet twice a day Methotrexate 2.5 mg- 8 tabs every Friday Vitamin D 50,000 units- one capsules two times a week, Tuesday and Friday   Patient declined (meds) last month:  Montelukast 10 mg. Patient stated medication is on hold per provider but not discontinued.  Patient is due for next adherence delivery on:  02-21-2022  Called patient and reviewed medications and coordinated delivery.  This delivery to include: Tresiba 100 u/ml- Inject 10 units into skin daily Symbicort 80/4.5 mcg- 2 puffs twice daily in morning and at bedtime Trazodone 50 mg- 1 tablet at bedtime as needed for sleep Spironolactone 25 mg- one tablet twice a day Hydralazine 25 mg- 1 tab three times daily Metformin 500 mg- one tablet twice a day Atorvastatin 10 mg- one tablet daily Potassium CL ER 10 Meq- 3 tabs twice daily Paroxetine 10 mg- 1 tablet daily Diclofenac 1% gel- apply small amount to joints twice daily as needed Insulin Lispro (Humalog)- use according to sliding scale Entresto 97 mg/103 mg- one tablet twice a day Carvedilol 25 mg- One tablet twice a day TruePlus Pen Needles 32 G-use as directed Folic acid 1 mg- daily Methotrexate 2.5 mg- 8 tabs every Friday Vitamin D 50,000 units- one capsules two times a week, Tuesday and Friday   No short/acute fill needed  Patient declined the following medications: None  Patient needs refills for: Request sent to PCP Vitamin D Atorvastatin Paroxetine  Confirmed delivery date of 02-21-2022 advised patient that pharmacy will contact them the morning of delivery.  Care Gaps: Shingrix overdue Colonoscopy overdue Yearly foot exam overdue Covid booster overdue Yearly Ophthalmology exam overdue AWV 10-03-2022  Star Rating Drugs: Atorvastatin 10 mg- Last filled 01-16-2022  [facility] Entresto 97/103 mg- Last filled 01-16-2022 [facility] Metformin 500 mg- Last filled 01-16-2022 [facility]  [place] Clinical Pharmacist Assistant [phone]

## 2022-02-19 DIAGNOSIS — M069 Rheumatoid arthritis, unspecified: Secondary | ICD-10-CM

## 2022-02-19 DIAGNOSIS — E1165 Type 2 diabetes mellitus with hyperglycemia: Secondary | ICD-10-CM

## 2022-02-19 DIAGNOSIS — I10 Essential (primary) hypertension: Secondary | ICD-10-CM

## 2022-02-19 DIAGNOSIS — I5022 Chronic systolic (congestive) heart failure: Secondary | ICD-10-CM

## 2022-02-19 DIAGNOSIS — J4520 Mild intermittent asthma, uncomplicated: Secondary | ICD-10-CM

## 2022-02-20 ENCOUNTER — Telehealth (HOSPITAL_COMMUNITY): Payer: Self-pay | Admitting: Surgery

## 2022-02-20 DIAGNOSIS — G4733 Obstructive sleep apnea (adult) (pediatric): Secondary | ICD-10-CM

## 2022-02-20 NOTE — Telephone Encounter (Signed)
I notified Christine Mcdowell that her home sleep study is not sufficient due to "bad O2 signal and no respiratory indices available".  Christine Mcdowell has now attempted to complete the home sleep study 2 times without success.  I will place an order for referral for a split night sleep study.  She is aware and agreeable. ?

## 2022-02-25 ENCOUNTER — Emergency Department (HOSPITAL_BASED_OUTPATIENT_CLINIC_OR_DEPARTMENT_OTHER): Payer: Medicare Other

## 2022-02-25 ENCOUNTER — Emergency Department (HOSPITAL_BASED_OUTPATIENT_CLINIC_OR_DEPARTMENT_OTHER)
Admission: EM | Admit: 2022-02-25 | Discharge: 2022-02-25 | Discharge status: Against Medical Advice | Payer: Medicare Other | Attending: Emergency Medicine | Admitting: Emergency Medicine

## 2022-02-25 ENCOUNTER — Other Ambulatory Visit: Payer: Self-pay

## 2022-02-25 ENCOUNTER — Encounter (HOSPITAL_BASED_OUTPATIENT_CLINIC_OR_DEPARTMENT_OTHER): Payer: Self-pay | Admitting: Urology

## 2022-02-25 ENCOUNTER — Emergency Department: Payer: Medicare Other

## 2022-02-25 DIAGNOSIS — Z794 Long term (current) use of insulin: Secondary | ICD-10-CM | POA: Diagnosis not present

## 2022-02-25 DIAGNOSIS — R0602 Shortness of breath: Secondary | ICD-10-CM | POA: Diagnosis not present

## 2022-02-25 DIAGNOSIS — I517 Cardiomegaly: Secondary | ICD-10-CM | POA: Diagnosis not present

## 2022-02-25 DIAGNOSIS — E876 Hypokalemia: Secondary | ICD-10-CM | POA: Diagnosis not present

## 2022-02-25 DIAGNOSIS — Z79899 Other long term (current) drug therapy: Secondary | ICD-10-CM | POA: Insufficient documentation

## 2022-02-25 DIAGNOSIS — J029 Acute pharyngitis, unspecified: Secondary | ICD-10-CM | POA: Insufficient documentation

## 2022-02-25 DIAGNOSIS — I509 Heart failure, unspecified: Secondary | ICD-10-CM | POA: Diagnosis not present

## 2022-02-25 DIAGNOSIS — Z91040 Latex allergy status: Secondary | ICD-10-CM | POA: Diagnosis not present

## 2022-02-25 DIAGNOSIS — J45909 Unspecified asthma, uncomplicated: Secondary | ICD-10-CM | POA: Insufficient documentation

## 2022-02-25 DIAGNOSIS — Z7982 Long term (current) use of aspirin: Secondary | ICD-10-CM | POA: Insufficient documentation

## 2022-02-25 DIAGNOSIS — Z20822 Contact with and (suspected) exposure to covid-19: Secondary | ICD-10-CM | POA: Insufficient documentation

## 2022-02-25 DIAGNOSIS — I110 Hypertensive heart disease with heart failure: Secondary | ICD-10-CM | POA: Insufficient documentation

## 2022-02-25 LAB — D-DIMER, QUANTITATIVE: D-Dimer, Quant: 3.36 ug/mL-FEU — ABNORMAL HIGH (ref 0.00–0.50)

## 2022-02-25 LAB — CBC WITH DIFFERENTIAL/PLATELET
Abs Immature Granulocytes: 0.02 10*3/uL (ref 0.00–0.07)
Basophils Absolute: 0 10*3/uL (ref 0.0–0.1)
Basophils Relative: 1 %
Eosinophils Absolute: 0 10*3/uL (ref 0.0–0.5)
Eosinophils Relative: 1 %
HCT: 32.3 % — ABNORMAL LOW (ref 36.0–46.0)
Hemoglobin: 10.5 g/dL — ABNORMAL LOW (ref 12.0–15.0)
Immature Granulocytes: 1 %
Lymphocytes Relative: 23 %
Lymphs Abs: 1 10*3/uL (ref 0.7–4.0)
MCH: 27.7 pg (ref 26.0–34.0)
MCHC: 32.5 g/dL (ref 30.0–36.0)
MCV: 85.2 fL (ref 80.0–100.0)
Monocytes Absolute: 0.6 10*3/uL (ref 0.1–1.0)
Monocytes Relative: 13 %
Neutro Abs: 2.7 10*3/uL (ref 1.7–7.7)
Neutrophils Relative %: 61 %
Platelets: 224 10*3/uL (ref 150–400)
RBC: 3.79 MIL/uL — ABNORMAL LOW (ref 3.87–5.11)
RDW: 14.5 % (ref 11.5–15.5)
WBC: 4.4 10*3/uL (ref 4.0–10.5)
nRBC: 0 % (ref 0.0–0.2)

## 2022-02-25 LAB — BASIC METABOLIC PANEL
Anion gap: 11 (ref 5–15)
BUN: 14 mg/dL (ref 6–20)
CO2: 25 mmol/L (ref 22–32)
Calcium: 9.2 mg/dL (ref 8.9–10.3)
Chloride: 106 mmol/L (ref 98–111)
Creatinine, Ser: 1 mg/dL (ref 0.44–1.00)
GFR, Estimated: >60 mL/min (ref >60)
Glucose, Bld: 87 mg/dL (ref 70–99)
Potassium: 3.1 mmol/L — ABNORMAL LOW (ref 3.5–5.1)
Sodium: 142 mmol/L (ref 135–145)

## 2022-02-25 LAB — RESP PANEL BY RT-PCR (FLU A&B, COVID) ARPGX2
Influenza A by PCR: NEGATIVE
Influenza B by PCR: NEGATIVE
SARS Coronavirus 2 by RT PCR: NEGATIVE

## 2022-02-25 LAB — TROPONIN I (HIGH SENSITIVITY): Troponin I (High Sensitivity): 3 ng/L (ref <18)

## 2022-02-25 LAB — BRAIN NATRIURETIC PEPTIDE: B Natriuretic Peptide: 72.9 pg/mL (ref 0.0–100.0)

## 2022-02-25 LAB — GROUP A STREP BY PCR: Group A Strep by PCR: NOT DETECTED

## 2022-02-25 MED ORDER — POTASSIUM CHLORIDE CRYS ER 20 MEQ PO TBCR
40.0000 meq | EXTENDED_RELEASE_TABLET | Freq: Once | ORAL | Status: AC
  Administered 2022-02-25: 40 meq via ORAL
  Filled 2022-02-25: qty 2

## 2022-02-25 MED ORDER — IOHEXOL 350 MG/ML SOLN: 75.0000 mL | Freq: Once | INTRAVENOUS | Status: DC | PRN

## 2022-02-25 MED ORDER — IOHEXOL 350 MG/ML SOLN
75.0000 mL | Freq: Once | INTRAVENOUS | Status: AC | PRN
  Administered 2022-02-25: 75 mL via INTRAVENOUS

## 2022-02-25 NOTE — Progress Notes (Signed)
During Ct angio chest PE ,patients IV exstravasated-approximately 50 ml omnipaque 350 --provider Lavenia Atlas M,DO was notified-exam was not completed-will need new IV to complete exam ?Arm was elevated and heat applied-nursing also notified ?

## 2022-02-25 NOTE — ED Provider Notes (Signed)
Ultrasound ED Peripheral IV (Provider) ? ?Date/Time: 02/25/2022 7:36 PM ?Performed by: Lorelle Gibbs, DO ?Authorized by: Lorelle Gibbs, DO  ? ?Procedure details:  ?  Indications: multiple failed IV attempts and poor IV access   ?  Skin Prep: chlorhexidine gluconate   ?  Location:  Left AC ?  Angiocath:  20 G ?  Bedside Ultrasound Guided: Yes   ?  Images: not archived   ?  Patient tolerated procedure without complications: Yes   ?  Dressing applied: Yes   ? ?  ?Lorelle Gibbs, DO ?02/25/22 1937 ? ?

## 2022-02-25 NOTE — ED Triage Notes (Signed)
Shortness of breath that started yesterday  ?Increased WOB noted  ?Duoneb prior to arrival  ?Pt states sinus congestion x 2 weeks ?+ Flu 2 weeks ago  ?Hx of asthma, lupus, and CHF  ? ?Respiratory at bedside  ?

## 2022-02-25 NOTE — ED Provider Notes (Signed)
Initial ultrasound-guided IV placed in the left AC by myself infiltrated during CT PE study.  Had a long discussion with the patient about her options.  I recommended transfer most likely for VQ scan.  I explained the risk of trying another ultrasound-guided IV in the right upper extremity with risk of extravasation with another CT PE study and risk of additional radiation.  Discussed with the patient the option for transfer for VQ scan and the patient declines.  States she does not want to go to another facility specifically [facility]. ? ?Patient is asking me to place another right upper extremity ultrasound-guided IV.  She understands the risk of unsuccessful IV placement, additional extravasation with radiology study.  She chooses to take on these risks versus transfer or leaving at this time.  A right AC ultrasound-guided IV was successfully placed and flushed aggressively.  Spoke with CT who understands the preference to try an additional CT PE study. ? ? ? ?Ultrasound ED Peripheral IV (Provider) ? ?Date/Time: 02/25/2022 9:17 PM ?Performed by: Lorelle Gibbs, DO ?Authorized by: Lorelle Gibbs, DO  ? ?Procedure details:  ?  Indications: multiple failed IV attempts   ?  Skin Prep: chlorhexidine gluconate   ?  Location:  Right AC ?  Angiocath:  20 G ?  Bedside Ultrasound Guided: Yes   ?  Images: not archived   ?  Patient tolerated procedure without complications: Yes   ?  Dressing applied: Yes   ?Comments:  ?   Placed at the request of the patient.  Flushed aggressively with no signs of infiltration.  Patient comfortable, no complications. ? ? ?Right before CT PE study the patient started having some right upper extremity discomfort, the line is no longer flushable.  The plan for CT PE study was aborted after educated the patient on the risk of his because of his age and, I am not comfortable using the right upper extremity arm.  Given the elevated D-dimer, lupus state my main concern is for pulmonary  embolism.  Recommended transfer to [facility] or [facility] for planned nuclear medicine study.  However the patient declines.  She is recommending that she gets discharged and will follow-up with her cardiologist/primary doctor to have this test ordered as an outpatient.  She understands the risks of delaying diagnosis of pulmonary embolism.  She has no tachycardia or hypoxia at this time.  She is very pleasant, cooperative and understanding but I am unable to convince her for transfer and more emergent nuclear med study. ? ?I have recommended that the patient stays in the department for the completion of their workup however they decline.  I have expressed the importance of staying including the risks of leaving which include worsening condition, permanent disability and death.  Patient accepts these risks.  They are alert and oriented, they have capacity to make this decision.  I have not been able to convince the patient to stay and they understand the risks of leaving [place].  ? ?  ?Lorelle Gibbs, DO ?02/25/22 2231 ? ?

## 2022-02-25 NOTE — ED Provider Notes (Cosign Needed)
[place], PA-C transferred this patient to me at shift change. Christine Mcdowell is a 51 y.o. female with significant medical history that includes asthma, CHF, fibomyalgia, lupus, HTN, IDA, migraine, RA, stroke. Patient presents to ED for evaluation of shortness of breath. Patient states that SOB began yesterday and then acutely worsened today causing her to seek medical care. Patient diagnosed with influenza on 2/6 and she states ever since this diagnosis she has had lingering congestion. Patient continues that she woke up with sore throat this morning as well. Patient denies fever, nausea, vomiting, abdominal pain, weakness, leg swelling, chest pain. Patient is endorsing sore throat, shortness of breath, diarrhea. ?Physical Exam  ?BP 102/65   Pulse 95   Temp 97.8 ?F (36.6 ?C) (Oral)   Resp 19   Ht '5\' 6"'$  (1.676 m)   Wt 89.2 kg   SpO2 100%   BMI 31.73 kg/m?  ? ?Physical Exam ?Constitutional:   ?   General: She is not in acute distress. ?HENT:  ?   Head: Normocephalic.  ?Cardiovascular:  ?   Rate and Rhythm: Normal rate and regular rhythm.  ?   Pulses: Normal pulses.  ?   Heart sounds: Normal heart sounds.  ?Pulmonary:  ?   Effort: Pulmonary effort is normal. No respiratory distress.  ?   Breath sounds: Normal breath sounds. No decreased breath sounds.  ?Chest:  ?   Chest wall: No tenderness.  ?Abdominal:  ?   Palpations: Abdomen is soft.  ?   Tenderness: There is no abdominal tenderness.  ?Musculoskeletal:     ?   General: Normal range of motion.  ?   Cervical back: Normal range of motion and neck supple.  ?Skin: ?   General: Skin is warm and dry.  ?   Capillary Refill: Capillary refill takes less than 2 seconds.  ?Neurological:  ?   Mental Status: She is alert.  ? ? ?Procedures  ?Procedures ? ?ED Course / MDM  ?  ?Medical Decision Making ?Amount and/or Complexity of Data Reviewed ?Labs: ordered. ?Radiology: ordered. ? ?Risk ?Prescription drug management. ? ? ?On examination, the patient is afebrile,  nontachycardic, nonhypoxic, clear lung sounds bilaterally, no lower extremity edema, soft compressible abdomen.  Patient nontoxic in appearance. ?  ?Patient worked up by [employer] ?- Group A strep by PCR not detected ?- Respiratory panel negative ?- Troponin resulted at 3 ?- BNP unremarkable ?- BMP shows patient is hypokalemic at 3.1.  Patient treated utilizing 40 mEq of potassium ?- Patient CBC in line with patient baseline, no elevated white blood cell count ?- Chest x-ray shows no signs of consolidation, effusion, mediastinal widening, fluid ?- Patient D-dimer elevated at 3.36.  Due to elevated D-dimer, patient requires CTA to rule out pulmonary embolism.  Patient has increased risk for this due to the fact she has lupus. ? ?CT-A was pending at shift change. The patient had 2 ultrasound guided IV's which infiltrated with contrast dye. The patient declined further IV sticks. The patient could be evaluated with a VQ scan. It is currently too late in the day for a VQ scan to be completed in the hospital system. Dr. Dina Rich took over patient care.  ?  ?Dorothyann Peng, PA-C ?02/25/22 2137 ? ?

## 2022-02-25 NOTE — ED Notes (Signed)
Assessed PT with complaint of SOB. PT is in mild respiratory distress, Sp02 100% on RA, BBS clear, no lower leg edema. ?

## 2022-02-25 NOTE — ED Provider Notes (Cosign Needed)
[facility] Provider Note   CSN: [id number] Arrival date & time: 02/25/22  1244     History  Chief Complaint  Patient presents with   Shortness of Breath    Christine Mcdowell is a 51 y.o. female with significant medical history that includes asthma, CHF, fibomyalgia, lupus, HTN, IDA, migraine, RA, stroke. Patient presents to ED for evaluation of shortness of breath. Patient states that SOB began yesterday and then acutely worsened today causing her to seek medical care. Patient diagnosed with influenza on 2/6 and she states ever since this diagnosis she has had lingering congestion. Patient continues that she woke up with sore throat this morning as well. Patient denies fever, nausea, vomiting, abdominal pain, weakness, leg swelling, chest pain. Patient is endorsing sore throat, shortness of breath, diarrhea.   Shortness of Breath Associated symptoms: sore throat   Associated symptoms: no abdominal pain, no chest pain, no fever and no vomiting       Home Medications Prior to Admission medications   Medication Sig Start Date End Date Taking? Authorizing Provider  aspirin 81 MG chewable tablet Chew 162 mg by mouth daily. Taking 2 tablets    [provider]  atorvastatin (LIPITOR) 10 MG tablet Take 1 tablet (10 mg total) by mouth daily. 02/11/22   [name], FNP  carvedilol (COREG) 25 MG tablet TAKE ONE TABLET BY MOUTH EVERY MORNING and TAKE ONE TABLET BY MOUTH EVERY EVENING 09/17/21   Bensimhon, Shaune Pascal, MD  Continuous Blood Gluc Receiver (DEXCOM G6 RECEIVER) DEVI 1 Device by Does not apply route 3 (three) times daily before meals. 04/12/20   [name], FNP  Continuous Blood Gluc Sensor (DEXCOM G6 SENSOR) MISC Inject 1 each into the skin 3 (three) times daily. 04/12/20   [name], FNP  Continuous Blood Gluc Transmit (DEXCOM G6 TRANSMITTER) MISC 1 Device by Does not apply route 3 (three) times daily before meals. 04/12/20   [name],  FNP  folic acid (FOLVITE) 1 MG tablet TAKE 1 TABLET BY MOUTH EVERY MORNING 01/30/17   Bensimhon, Shaune Pascal, MD  Glucagon (GVOKE HYPOPEN 2-PACK) 1 MG/0.2ML SOAJ Inject 1 mg into the skin as needed. 01/23/21   [name], FNP  hydrALAZINE (APRESOLINE) 25 MG tablet Take 1 tablet (25 mg total) by mouth 3 (three) times daily. Please schedule appointment for future refills. Thank you 09/17/21   Evans Lance, MD  hydroxychloroquine (PLAQUENIL) 200 MG tablet Take 1 tablet (200 mg total) by mouth 2 (two) times daily. 02/02/19   Bensimhon, Shaune Pascal, MD  insulin degludec (TRESIBA FLEXTOUCH) 100 UNIT/ML FlexTouch Pen Inject 10 Units into the skin daily. 12/13/21   [name], FNP  insulin lispro (HUMALOG KWIKPEN) 100 UNIT/ML KwikPen Use according to sliding scale 12/13/21   [name], FNP  Insulin Pen Needle (NOVOFINE PLUS PEN NEEDLE) 32G X 4 MM MISC Use with insulin pens dx code e11.65 11/13/21   [name], FNP  Ipratropium-Albuterol (COMBIVENT RESPIMAT) 20-100 MCG/ACT AERS respimat Inhale 1 puff into the lungs every 6 (six) hours. 10/11/21   [name], FNP  ipratropium-albuterol (DUONEB) 0.5-2.5 (3) MG/3ML SOLN Take 3 mLs by nebulization every 6 (six) hours as needed. 10/11/21   [name], FNP  Magnesium 400 MG TABS Take 1 tablet by mouth daily.     [provider]  metFORMIN (GLUCOPHAGE) 500 MG tablet TAKE ONE TABLET BY MOUTH TWICE DAILY 10/19/21   [name], FNP  methotrexate (RHEUMATREX) 2.5 MG tablet Take 20 mg  by mouth every Friday.  02/06/15   [provider]  metolazone (ZAROXOLYN) 2.5 MG tablet TAKE 1 TABLET BY MOUTH AS NEEDED FOR WEIGHT GAIN OF 5 POUNDS WITHIN 3 DAYS AS DIRECTED needs appt for further refills 03/16/21   Bensimhon, Shaune Pascal, MD  Multiple Vitamin (MULTIVITAMIN WITH MINERALS) TABS Take 1 tablet by mouth every morning.     [provider]  ondansetron (ZOFRAN-ODT) 8 MG disintegrating tablet Take 1 tablet (8 mg total) by mouth every 8 (eight)  hours as needed for nausea or vomiting. 12/24/21   Lajean Saver, MD  PARoxetine (PAXIL) 10 MG tablet Take 1 tablet (10 mg total) by mouth every morning. 02/11/22   [name], FNP  potassium chloride (KLOR-CON M) 10 MEQ tablet Take 3 tablets (30 mEq total) by mouth 2 (two) times daily. 12/13/21   [name], FNP  PREDNISONE PO Take 5 mg by mouth daily.    [provider]  PROAIR HFA 108 [phone] Base) MCG/ACT inhaler INHALE 2 PUFFS BY MOUTH EVERY DAY AS NEEDED FOR SHORTNESS OF BREATH 03/07/20   [name], FNP  sacubitril-valsartan (ENTRESTO) 97-103 MG Take 1 tablet by mouth 2 (two) times daily. 11/13/21   Bensimhon, Shaune Pascal, MD  spironolactone (ALDACTONE) 25 MG tablet TAKE ONE TABLET BY MOUTH EVERY MORNING and TAKE ONE TABLET BY MOUTH EVERY EVENING 01/16/22   Bensimhon, Shaune Pascal, MD  torsemide (DEMADEX) 20 MG tablet Take 40 mg by mouth daily as needed.    [provider]  traZODone (DESYREL) 50 MG tablet Take 50 mg by mouth at bedtime as needed. 07/05/21   [provider]  Vitamin D, Ergocalciferol, (DRISDOL) 1.25 MG (50000 UNIT) CAPS capsule Take 1 capsule (50,000 Units total) by mouth 2 (two) times a week. 02/11/22   [name], FNP      Allergies    Hydrocodone-acetaminophen, Hydromorphone, Iodinated contrast media, Other, Erythromycin, Latex, Rinvoq [upadacitinib], Farxiga [dapagliflozin], Tape, and Mircette [desogestrel-ethinyl estradiol]    Review of Systems   Review of Systems  Constitutional:  Negative for fever.  HENT:  Positive for congestion and sore throat.   Respiratory:  Positive for shortness of breath.   Cardiovascular:  Negative for chest pain and leg swelling.  Gastrointestinal:  Positive for diarrhea. Negative for abdominal pain, nausea and vomiting.  Neurological:  Negative for weakness.   Physical Exam Updated Vital Signs BP 102/65    Pulse 95    Temp 97.8 F (36.6 C) (Oral)    Resp 19    Ht '5\' 6"'$  (1.676 m)    Wt 89.2 kg    SpO2 100%     BMI 31.73 kg/m  Physical Exam Vitals and nursing note reviewed.  Constitutional:      General: She is not in acute distress.    Appearance: She is not ill-appearing, toxic-appearing or diaphoretic.  HENT:     Head: Normocephalic and atraumatic.     Nose: Nose normal.     Mouth/Throat:     Mouth: Mucous membranes are moist.  Eyes:     Extraocular Movements: Extraocular movements intact.     Conjunctiva/sclera: Conjunctivae normal.     Pupils: Pupils are equal, round, and reactive to light.  Neck:     Vascular: No JVD.  Cardiovascular:     Rate and Rhythm: Normal rate and regular rhythm.  Pulmonary:     Effort: Pulmonary effort is normal. No tachypnea, accessory muscle usage or respiratory distress.     Breath sounds: Normal breath  sounds. No stridor. No decreased breath sounds, wheezing, rhonchi or rales.  Abdominal:     General: Bowel sounds are normal.     Palpations: Abdomen is soft. There is no splenomegaly or mass.     Tenderness: There is no abdominal tenderness. There is no guarding or rebound.  Musculoskeletal:        General: Normal range of motion.     Cervical back: Normal range of motion and neck supple.     Right lower leg: No tenderness. No edema.     Left lower leg: No tenderness. No edema.  Skin:    General: Skin is warm and dry.     Capillary Refill: Capillary refill takes less than 2 seconds.  Neurological:     Mental Status: She is alert and oriented to person, place, and time.    ED Results / Procedures / Treatments   Labs (all labs ordered are listed, but only abnormal results are displayed) Labs Reviewed  CBC WITH DIFFERENTIAL/PLATELET - Abnormal; Notable for the following components:      Result Value   RBC 3.79 (*)    Hemoglobin 10.5 (*)    HCT 32.3 (*)    All other components within normal limits  BASIC METABOLIC PANEL - Abnormal; Notable for the following components:   Potassium 3.1 (*)    All other components within normal limits  D-DIMER,  QUANTITATIVE - Abnormal; Notable for the following components:   D-Dimer, Quant 3.36 (*)    All other components within normal limits  RESP PANEL BY RT-PCR (FLU A&B, COVID) ARPGX2  GROUP A STREP BY PCR  CULTURE, GROUP A STREP [facility])  BRAIN NATRIURETIC PEPTIDE  TROPONIN I (HIGH SENSITIVITY)    EKG None  Radiology DG Chest 2 View  Result Date: 02/25/2022 CLINICAL DATA:  Shortness of breath EXAM: CHEST - 2 VIEW COMPARISON:  01/24/2022 FINDINGS: Left-sided single lead implanted cardiac device. Stable cardiomegaly. No focal airspace consolidation, pleural effusion, or pneumothorax. IMPRESSION: No active cardiopulmonary disease. Electronically Signed   By: Davina Poke D.O.   On: 02/25/2022 13:13    Procedures Procedures    Medications Ordered in ED Medications  potassium chloride SA (KLOR-CON M) CR tablet 40 mEq (40 mEq Oral Given 02/25/22 1653)    ED Course/ Medical Decision Making/ A&P                           Medical Decision Making Amount and/or Complexity of Data Reviewed Labs: ordered. Radiology: ordered.  Risk Prescription drug management.   51 year old female presents ED for evaluation of shortness of breath.  Please see HPI for further details. On examination, the patient is afebrile, nontachycardic, nonhypoxic, clear lung sounds bilaterally, no lower extremity edema, soft compressible abdomen.  Patient nontoxic in appearance.  Patient worked up utilizing the following labs and imaging studies interpreted by me: - Group A strep by PCR not detected - Respiratory panel negative - Troponin resulted at 3 - BNP unremarkable - BMP shows patient is hypokalemic at 3.1.  Patient treated utilizing 40 mEq of potassium - Patient CBC in line with patient baseline, no elevated white blood cell count - Chest x-ray shows no signs of consolidation, effusion, mediastinal widening, fluid - Patient D-dimer elevated at 3.36.  Due to elevated D-dimer, patient requires CTA to rule  out pulmonary embolism.  Patient has increased risk for this due to the fact she has lupus.  At the end of my shift,  the patient CT angio has not been conducted.  The study is pending.  Patient will be signed out to my colleague Cherlynn June, PA-C for further disposition and management.  If patient CTA is negative, patient will be discharged home with PCP follow-up.   Final Clinical Impression(s) / ED Diagnoses Final diagnoses:  Shortness of breath    Rx / DC Orders ED Discharge Orders     None         Azucena Cecil, PA-C 02/25/22 1911

## 2022-02-25 NOTE — Discharge Instructions (Signed)
You have been seen and discharged from the emergency department.  You need to have a pulmonary embolism ruled out.  We were unable to do so with a CT study due to failed ultrasound-guided IV attempts.  You need to have a nuclear med study which is called a VQ scan.  We offered to transfer to [place] to get this test done emergently.  If you do have a pulmonary embolism admission would have been recommended.  You have declined.  It is recommended that you get this test done as soon as possible.  In order for this exam has been placed from me however your cardiologist or primary doctor may need to order this as an outpatient study.  I have sent a message to your cardiologist explaining your visit tonight.  ? ?You have chosen to leave the emergency department [place].  I have explained to you your testing results and the need for further evaluation.  You have verbalized understanding of these results and plan and are choosing to leave the hospital [place]. You understand the risks associated with leaving without further evaluation/treatment. If you have any worsening symptoms or choose to be treated please return immediately to emergency department. ?

## 2022-02-26 ENCOUNTER — Ambulatory Visit (INDEPENDENT_AMBULATORY_CARE_PROVIDER_SITE_OTHER): Payer: Medicare Other | Admitting: Nurse Practitioner

## 2022-02-26 ENCOUNTER — Telehealth: Payer: Self-pay | Admitting: Internal Medicine

## 2022-02-26 ENCOUNTER — Encounter: Payer: Self-pay | Admitting: Nurse Practitioner

## 2022-02-26 ENCOUNTER — Telehealth: Payer: Self-pay

## 2022-02-26 VITALS — BP 130/80 | HR 91 | Temp 98.1°F | Ht 66.0 in | Wt 192.4 lb

## 2022-02-26 DIAGNOSIS — Z6831 Body mass index (BMI) 31.0-31.9, adult: Secondary | ICD-10-CM | POA: Diagnosis not present

## 2022-02-26 DIAGNOSIS — R0602 Shortness of breath: Secondary | ICD-10-CM

## 2022-02-26 DIAGNOSIS — Z23 Encounter for immunization: Secondary | ICD-10-CM | POA: Diagnosis not present

## 2022-02-26 DIAGNOSIS — E1165 Type 2 diabetes mellitus with hyperglycemia: Secondary | ICD-10-CM | POA: Diagnosis not present

## 2022-02-26 DIAGNOSIS — E6609 Other obesity due to excess calories: Secondary | ICD-10-CM | POA: Diagnosis not present

## 2022-02-26 DIAGNOSIS — I10 Essential (primary) hypertension: Secondary | ICD-10-CM

## 2022-02-26 DIAGNOSIS — E876 Hypokalemia: Secondary | ICD-10-CM

## 2022-02-26 NOTE — Progress Notes (Unsigned)
This visit occurred during the SARS-CoV-2 public health emergency.  Safety protocols were in place, including screening questions prior to the visit, additional usage of staff PPE, and extensive cleaning of exam room while observing appropriate contact time as indicated for disinfecting solutions.  Subjective:     Patient ID: Christine Mcdowell , female    DOB: Mar 12, 1971 , 51 y.o.   MRN: [id number]   Chief Complaint  Patient presents with   Diabetes    HPI  Pt presents today for ed f/u. She was seen in the ER and has an elevated d-dimer and did not want to be to stay long pending the insurance not being covered. She has called Dr Tempie Hoist and she is to follow up with him on March 17th.  She has seen Dr Tempie Hoist in January 2023 - EF is around 45-50 per patient. She is walking her dog and eating better.   Wt Readings from Last 3 Encounters: 02/26/22 : 192 lb 6.4 oz (87.3 kg) 02/25/22 : 196 lb 9.6 oz (89.2 kg) 02/04/22 : 196 lb 9.6 oz (89.2 kg)  When she takes her fluid pill, her potassium drops. It was 3.1 yesterday and was given potassium supplements in the ER.    Diabetes She presents for her follow-up diabetic visit. She has type 2 diabetes mellitus. Pertinent negatives for hypoglycemia include no confusion or nervousness/anxiousness. There are no diabetic associated symptoms. Pertinent negatives for diabetes include no polydipsia, no polyphagia and no polyuria. (She has drank juice to help increase her blood sugars. ) There are no diabetic complications. Risk factors for coronary artery disease include sedentary lifestyle and obesity. Current diabetic treatment includes oral agent (dual therapy) (tresiba (10 units nightly) and metformin - not having spikes.). She is compliant with treatment all of the time. She is following a generally healthy diet. When asked about meal planning, she reported none. She has not had a previous visit with a dietitian. She rarely participates in exercise.  (Her blood sugars are staying around normal. Blood sugar after eating is 240-269 otherwise 130's. ) An ACE inhibitor/angiotensin II receptor blocker is being taken. She does not see a podiatrist.Eye exam is not current.    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   Anemia    Anginal pain (HCC)    Asthma    Cervical cancer ([place])    cervical 1996   CHF (congestive heart failure) ([place])    Diabetes mellitus without complication ([place])    steroid induced   Discoid lupus    Fibromyalgia    History of blood transfusion "several"   "related to anemia; had some w/hysterectomy also"   Hx of cardiovascular stress test    ETT-Myoview (9/15):  No ischemia, EF 52%; NORMAL   Hx of echocardiogram    Echo (9/15):  EF 50-55%, ant HK, Gr 1 DD, mild MR, mild LAE, no effusion   Hypertension    Iron deficiency anemia    h/o iron transfusions   Lupus (systemic lupus erythematosus) ([place])    Migraine    "a few/year" (07/03/2016)   Pneumonia 12/2015   RA (rheumatoid arthritis) ([place])    "all over" (07/03/2016)   Sickle cell trait ([place])    Stroke ([place]) 2014 X 1; 2015 X 2; 2016 X 1;    "right side of face more relaxed than the other; rare speech hesitation" (07/03/2016)   Vaginal Pap smear, abnormal    ASCUS; HPV     Family  History  Problem Relation Age of Onset   Arthritis Mother    Heart murmur Mother    Drug abuse Mother    Allergies Mother    Heart attack Father    Cushing syndrome Father    Depression Father    Allergies Father    Dementia Paternal Grandmother    Cancer Paternal Grandfather    Diabetes Maternal Grandmother    Hypertension Maternal Grandmother    Asthma Maternal Grandmother    Heart attack Maternal Grandfather    Breast cancer Neg Hx      Current Outpatient Medications:    aspirin 81 MG chewable tablet, Chew 162 mg by mouth daily. Taking 2 tablets, Disp: , Rfl:    atorvastatin (LIPITOR) 10 MG tablet, Take 1  tablet (10 mg total) by mouth daily., Disp: 90 tablet, Rfl: 2   carvedilol (COREG) 25 MG tablet, TAKE ONE TABLET BY MOUTH EVERY MORNING and TAKE ONE TABLET BY MOUTH EVERY EVENING, Disp: 60 tablet, Rfl: 6   Continuous Blood Gluc Receiver (DEXCOM G6 RECEIVER) DEVI, 1 Device by Does not apply route 3 (three) times daily before meals., Disp: 1 each, Rfl: 1   Continuous Blood Gluc Sensor (DEXCOM G6 SENSOR) MISC, Inject 1 each into the skin 3 (three) times daily., Disp: 3 each, Rfl: 1   Continuous Blood Gluc Transmit (DEXCOM G6 TRANSMITTER) MISC, 1 Device by Does not apply route 3 (three) times daily before meals., Disp: 1 each, Rfl: 1   folic acid (FOLVITE) 1 MG tablet, TAKE 1 TABLET BY MOUTH EVERY MORNING, Disp: 30 tablet, Rfl: 0   Glucagon (GVOKE HYPOPEN 2-PACK) 1 MG/0.2ML SOAJ, Inject 1 mg into the skin as needed., Disp: 0.2 mL, Rfl: 5   hydrALAZINE (APRESOLINE) 25 MG tablet, Take 1 tablet (25 mg total) by mouth 3 (three) times daily. Please schedule appointment for future refills. Thank you, Disp: 270 tablet, Rfl: 3   hydroxychloroquine (PLAQUENIL) 200 MG tablet, Take 1 tablet (200 mg total) by mouth 2 (two) times daily., Disp: 180 tablet, Rfl: 0   insulin degludec (TRESIBA FLEXTOUCH) 100 UNIT/ML FlexTouch Pen, Inject 10 Units into the skin daily., Disp: 9 mL, Rfl: 1   insulin lispro (HUMALOG KWIKPEN) 100 UNIT/ML KwikPen, Use according to sliding scale, Disp: 15 mL, Rfl: 11   Insulin Pen Needle (NOVOFINE PLUS PEN NEEDLE) 32G X 4 MM MISC, Use with insulin pens dx code e11.65, Disp: 300 each, Rfl: 3   Ipratropium-Albuterol (COMBIVENT RESPIMAT) 20-100 MCG/ACT AERS respimat, Inhale 1 puff into the lungs every 6 (six) hours., Disp: 4 g, Rfl: 5   ipratropium-albuterol (DUONEB) 0.5-2.5 (3) MG/3ML SOLN, Take 3 mLs by nebulization every 6 (six) hours as needed., Disp: 360 mL, Rfl: 2   Magnesium 400 MG TABS, Take 1 tablet by mouth daily. , Disp: , Rfl:    metFORMIN (GLUCOPHAGE) 500 MG tablet, TAKE ONE TABLET BY  MOUTH TWICE DAILY, Disp: 180 tablet, Rfl: 1   methotrexate (RHEUMATREX) 2.5 MG tablet, Take 20 mg by mouth every Friday. , Disp: , Rfl: 3   metolazone (ZAROXOLYN) 2.5 MG tablet, TAKE 1 TABLET BY MOUTH AS NEEDED FOR WEIGHT GAIN OF 5 POUNDS WITHIN 3 DAYS AS DIRECTED needs appt for further refills, Disp: 5 tablet, Rfl: 3   Multiple Vitamin (MULTIVITAMIN WITH MINERALS) TABS, Take 1 tablet by mouth every morning. , Disp: , Rfl:    ondansetron (ZOFRAN-ODT) 8 MG disintegrating tablet, Take 1 tablet (8 mg total) by mouth every 8 (eight) hours as needed for  nausea or vomiting., Disp: 10 tablet, Rfl: 0   PARoxetine (PAXIL) 10 MG tablet, Take 1 tablet (10 mg total) by mouth every morning., Disp: 30 tablet, Rfl: 2   potassium chloride (KLOR-CON M) 10 MEQ tablet, Take 3 tablets (30 mEq total) by mouth 2 (two) times daily., Disp: 180 tablet, Rfl: 3   PREDNISONE PO, Take 5 mg by mouth daily., Disp: , Rfl:    PROAIR HFA 108 (90 Base) MCG/ACT inhaler, INHALE 2 PUFFS BY MOUTH EVERY DAY AS NEEDED FOR SHORTNESS OF BREATH, Disp: 18 g, Rfl: 2   sacubitril-valsartan (ENTRESTO) 97-103 MG, Take 1 tablet by mouth 2 (two) times daily., Disp: 180 tablet, Rfl: 3   spironolactone (ALDACTONE) 25 MG tablet, TAKE ONE TABLET BY MOUTH EVERY MORNING and TAKE ONE TABLET BY MOUTH EVERY EVENING, Disp: 60 tablet, Rfl: 3   torsemide (DEMADEX) 20 MG tablet, Take 40 mg by mouth daily as needed., Disp: , Rfl:    traZODone (DESYREL) 50 MG tablet, Take 50 mg by mouth at bedtime as needed., Disp: , Rfl:    Vitamin D, Ergocalciferol, (DRISDOL) 1.25 MG (50000 UNIT) CAPS capsule, Take 1 capsule (50,000 Units total) by mouth 2 (two) times a week., Disp: 12 capsule, Rfl: 3   Allergies  Allergen Reactions   Hydrocodone-Acetaminophen Nausea And Vomiting   Hydromorphone Nausea And Vomiting    Other reaction(s): GI Upset (intolerance), Hypertension (intolerance) Raises blood pressure  Other reaction(s): GI Upset (intolerance), Hypertension  (intolerance) Raises blood pressure to stroke level   Iodinated Contrast Media Other (See Comments)    Shuts down kidneys Shuts kidney function down   Other Other (See Comments) and Anaphylaxis    Spicy foods and seasonings Skin Prep "makes my skin peel off" Paper tape causes skin burns   Erythromycin Nausea And Vomiting   Latex Hives   Rinvoq [Upadacitinib] Other (See Comments)   Farxiga [Dapagliflozin] Other (See Comments)   Tape Other (See Comments)    "Skin burns"   Mircette [Desogestrel-Ethinyl Estradiol] Nausea And Vomiting and Rash     Review of Systems  Constitutional: Negative.   Respiratory: Negative.    Cardiovascular: Negative.   Endocrine: Negative for polydipsia, polyphagia and polyuria.  Neurological: Negative.   Psychiatric/Behavioral: Negative.  Negative for confusion. The patient is not nervous/anxious.     Today's Vitals   02/26/22 1448  BP: 130/80  Pulse: 91  Temp: 98.1 F (36.7 C)  Weight: 192 lb 6.4 oz (87.3 kg)  Height: '5\' 6"'$  (1.676 m)   Body mass index is 31.05 kg/m.  Wt Readings from Last 3 Encounters:  02/26/22 192 lb 6.4 oz (87.3 kg)  02/25/22 196 lb 9.6 oz (89.2 kg)  02/04/22 196 lb 9.6 oz (89.2 kg)   BP Readings from Last 3 Encounters:  02/26/22 130/80  02/25/22 110/62  02/04/22 (!) 98/50  . Objective:  Physical Exam Vitals reviewed.  Constitutional:      General: She is not in acute distress.    Appearance: Normal appearance.  Cardiovascular:     Rate and Rhythm: Normal rate and regular rhythm.     Pulses: Normal pulses.     Heart sounds: No murmur heard. Pulmonary:     Effort: Pulmonary effort is normal. No respiratory distress.     Breath sounds: Normal breath sounds. No wheezing.  Skin:    Capillary Refill: Capillary refill takes less than 2 seconds.  Neurological:     General: No focal deficit present.     Mental Status: She  is alert and oriented to person, place, and time.     Cranial Nerves: No cranial nerve deficit.      Motor: No weakness.  Psychiatric:        Mood and Affect: Mood normal.        Behavior: Behavior normal.        Thought Content: Thought content normal.        Judgment: Judgment normal.        Assessment And Plan:     1. Type 2 diabetes mellitus with hyperglycemia, without long-term current use of insulin ([place])  2. Essential hypertension  3. Need for vaccination - Varicella-zoster vaccine IM (Shingrix)  4. Class 1 obesity due to excess calories with serious comorbidity and body mass index (BMI) of 31.0 to 31.9 in adult  She is encouraged to strive for BMI less than 30 to decrease cardiac risk. Advised to aim for at least 150 minutes of exercise per week.    Patient was given opportunity to ask questions. Patient verbalized understanding of the plan and was able to repeat key elements of the plan. All questions were answered to their satisfaction.  [name], FNP   I, [name], FNP, have reviewed all documentation for this visit. The documentation on 02/26/22 for the exam, diagnosis, procedures, and orders are all accurate and complete.   IF YOU HAVE BEEN REFERRED TO A SPECIALIST, IT MAY TAKE 1-2 WEEKS TO SCHEDULE/PROCESS THE REFERRAL. IF YOU HAVE NOT HEARD FROM US/SPECIALIST IN TWO WEEKS, PLEASE GIVE [place] A CALL AT [phone].   THE PATIENT IS ENCOURAGED TO PRACTICE SOCIAL DISTANCING DUE TO THE COVID-19 PANDEMIC.

## 2022-02-26 NOTE — Patient Instructions (Signed)

## 2022-02-26 NOTE — Telephone Encounter (Signed)
Patient was scheduled for next available which was 3/17 '@10'$ :30am. Should we go ahead and get her set up for the VQ scan?  ?

## 2022-02-26 NOTE — Telephone Encounter (Signed)
Transition Care Management Follow-up Telephone Call ?Date of discharge and from where: 02/25/2022,  ?How have you been since you were released from the hospital? Pt states she still feels the same, SOB.  ?Any questions or concerns? No ? ?Items Reviewed: ?Did the pt receive and understand the discharge instructions provided? Yes  ?Medications obtained and verified? Yes  ?Other? Yes  ?Any new allergies since your discharge? No  ?Dietary orders reviewed? Yes ?Do you have support at home? Yes  ? ?Home Care and Equipment/Supplies: ?Were home health services ordered? no ?If so, what is the name of the agency? N/a  ?Has the agency set up a time to come to the patient's home? no ?Were any new equipment or medical supplies ordered?  No ?What is the name of the medical supply agency? N/a ?Were you able to get the supplies/equipment? no ?Do you have any questions related to the use of the equipment or supplies? No ? ?Functional Questionnaire: (I = Independent and D = Dependent) ?ADLs: i ? ?Bathing/Dressing- i ? ?Meal Prep- i ? ?Eating- i ? ?Maintaining continence- i ? ?Transferring/Ambulation- i ? ?Managing Meds- i ? ?Follow up appointments reviewed: ? ?PCP Hospital f/u appt confirmed? Yes  Scheduled to see [name] on 03/04/2022 @ [facility]. ?[facility] f/u appt confirmed? No  Scheduled to see n/a on n/a @ n/a. ?Are transportation arrangements needed? No  ?If their condition worsens, is the pt aware to call PCP or go to the Emergency Dept.? Yes ?Was the patient provided with contact information for the PCP's office or ED? Yes ?Was to pt encouraged to call back with questions or concerns? Yes  ?

## 2022-02-26 NOTE — Telephone Encounter (Signed)
Paged by Ms. Christine Mcdowell re her recent ED visit and next steps. She presented with worsening SOB. She was hemodynamically stable with normal O2 saturations. Her D-dimer was elevated and she was sent for a CTA. Unfortunately an IV infiltrated during contrast administration. Another IV was placed but blood return was lost from the IV before the scan was able to be completed. She was told she needed an emergent VQ scan to r/o PE, but did not want to come to [facility] for this (she reports being advised not to wait in emergency departments because she always gets sick).  ? ?She remained SOB while we talked on the phone, although she was speaking comfortably in full sentences. She did tell me her weight recently had gone up and she did take metolazone a few days ago. Her weight is back to baseline but she remains short of breath. She is not having any significant chest pain. She would prefer to wait until the AM to have a VQ scan performed. I did relate that I was not sure whether an outpatient VQ scan could be scheduled same day. I advised that I will pass all of this information on to Dr. Clayborne Dana team as it might be best for her to just be seen in clinic. I asked her to return to the emergency department if there is any change in her symptoms.  ?

## 2022-02-27 ENCOUNTER — Ambulatory Visit (INDEPENDENT_AMBULATORY_CARE_PROVIDER_SITE_OTHER): Payer: Medicare Other

## 2022-02-27 DIAGNOSIS — I420 Dilated cardiomyopathy: Secondary | ICD-10-CM | POA: Diagnosis not present

## 2022-02-27 LAB — CUP PACEART REMOTE DEVICE CHECK
Battery Remaining Longevity: 144 mo
Battery Remaining Percentage: 100 %
Brady Statistic RV Percent Paced: 0 %
Date Time Interrogation Session: 20230308045200
HighPow Impedance: 72 Ohm
Implantable Lead Implant Date: 20190206
Implantable Lead Location: 753860
Implantable Lead Model: 292
Implantable Lead Serial Number: 438194
Implantable Pulse Generator Implant Date: 20190206
Lead Channel Impedance Value: 525 Ohm
Lead Channel Setting Pacing Amplitude: 2.5 V
Lead Channel Setting Pacing Pulse Width: 0.4 ms
Lead Channel Setting Sensing Sensitivity: 0.5 mV
Pulse Gen Serial Number: 243572

## 2022-02-27 LAB — BMP8+EGFR
BUN/Creatinine Ratio: 12 (ref 9–23)
BUN: 13 mg/dL (ref 6–24)
CO2: 24 mmol/L (ref 20–29)
Calcium: 10.1 mg/dL (ref 8.7–10.2)
Chloride: 106 mmol/L (ref 96–106)
Creatinine, Ser: 1.07 mg/dL — ABNORMAL HIGH (ref 0.57–1.00)
Glucose: 99 mg/dL (ref 70–99)
Potassium: 4.7 mmol/L (ref 3.5–5.2)
Sodium: 145 mmol/L — ABNORMAL HIGH (ref 134–144)
eGFR: 62 mL/min/{1.73_m2} (ref >59)

## 2022-02-27 LAB — LIPID PANEL
Chol/HDL Ratio: 3.9 ratio (ref 0.0–4.4)
Cholesterol, Total: 188 mg/dL (ref 100–199)
HDL: 48 mg/dL (ref >39)
LDL Chol Calc (NIH): 120 mg/dL — ABNORMAL HIGH (ref 0–99)
Triglycerides: 108 mg/dL (ref 0–149)
VLDL Cholesterol Cal: 20 mg/dL (ref 5–40)

## 2022-02-27 LAB — CULTURE, GROUP A STREP (THRC)

## 2022-02-27 LAB — HEMOGLOBIN A1C
Est. average glucose Bld gHb Est-mCnc: 154 mg/dL
Hgb A1c MFr Bld: 7 % — ABNORMAL HIGH (ref 4.8–5.6)

## 2022-03-04 ENCOUNTER — Encounter (HOSPITAL_BASED_OUTPATIENT_CLINIC_OR_DEPARTMENT_OTHER): Payer: Self-pay | Admitting: Emergency Medicine

## 2022-03-04 ENCOUNTER — Other Ambulatory Visit: Payer: Self-pay

## 2022-03-04 ENCOUNTER — Ambulatory Visit: Payer: Medicare Other | Admitting: Nurse Practitioner

## 2022-03-04 DIAGNOSIS — Z794 Long term (current) use of insulin: Secondary | ICD-10-CM | POA: Diagnosis not present

## 2022-03-04 DIAGNOSIS — R0602 Shortness of breath: Secondary | ICD-10-CM | POA: Diagnosis not present

## 2022-03-04 DIAGNOSIS — Z7982 Long term (current) use of aspirin: Secondary | ICD-10-CM | POA: Diagnosis not present

## 2022-03-04 DIAGNOSIS — Z7984 Long term (current) use of oral hypoglycemic drugs: Secondary | ICD-10-CM | POA: Diagnosis not present

## 2022-03-04 DIAGNOSIS — M7989 Other specified soft tissue disorders: Secondary | ICD-10-CM | POA: Insufficient documentation

## 2022-03-04 DIAGNOSIS — R0789 Other chest pain: Secondary | ICD-10-CM | POA: Diagnosis not present

## 2022-03-04 DIAGNOSIS — R079 Chest pain, unspecified: Secondary | ICD-10-CM | POA: Diagnosis not present

## 2022-03-04 DIAGNOSIS — Z79899 Other long term (current) drug therapy: Secondary | ICD-10-CM | POA: Insufficient documentation

## 2022-03-04 DIAGNOSIS — Z950 Presence of cardiac pacemaker: Secondary | ICD-10-CM | POA: Insufficient documentation

## 2022-03-04 DIAGNOSIS — Z91040 Latex allergy status: Secondary | ICD-10-CM | POA: Diagnosis not present

## 2022-03-04 DIAGNOSIS — I509 Heart failure, unspecified: Secondary | ICD-10-CM | POA: Insufficient documentation

## 2022-03-04 NOTE — ED Triage Notes (Signed)
SOB and chest pain x 9 days ago, eval in ED on 3/7.

## 2022-03-05 ENCOUNTER — Encounter (HOSPITAL_BASED_OUTPATIENT_CLINIC_OR_DEPARTMENT_OTHER): Payer: Self-pay

## 2022-03-05 ENCOUNTER — Telehealth: Payer: Self-pay

## 2022-03-05 ENCOUNTER — Emergency Department (HOSPITAL_BASED_OUTPATIENT_CLINIC_OR_DEPARTMENT_OTHER): Payer: Medicare Other

## 2022-03-05 ENCOUNTER — Emergency Department (HOSPITAL_BASED_OUTPATIENT_CLINIC_OR_DEPARTMENT_OTHER)
Admission: EM | Admit: 2022-03-05 | Discharge: 2022-03-05 | Disposition: A | Discharge status: Home / Self Care | Payer: Medicare Other | Attending: Emergency Medicine | Admitting: Emergency Medicine

## 2022-03-05 DIAGNOSIS — R0602 Shortness of breath: Secondary | ICD-10-CM | POA: Diagnosis not present

## 2022-03-05 DIAGNOSIS — R0789 Other chest pain: Secondary | ICD-10-CM | POA: Diagnosis not present

## 2022-03-05 DIAGNOSIS — R079 Chest pain, unspecified: Secondary | ICD-10-CM | POA: Diagnosis not present

## 2022-03-05 LAB — BASIC METABOLIC PANEL
Anion gap: 9 (ref 5–15)
BUN: 14 mg/dL (ref 6–20)
CO2: 26 mmol/L (ref 22–32)
Calcium: 8.8 mg/dL — ABNORMAL LOW (ref 8.9–10.3)
Chloride: 104 mmol/L (ref 98–111)
Creatinine, Ser: 1.07 mg/dL — ABNORMAL HIGH (ref 0.44–1.00)
GFR, Estimated: >60 mL/min (ref >60)
Glucose, Bld: 97 mg/dL (ref 70–99)
Potassium: 3.4 mmol/L — ABNORMAL LOW (ref 3.5–5.1)
Sodium: 139 mmol/L (ref 135–145)

## 2022-03-05 LAB — CBC WITH DIFFERENTIAL/PLATELET
Abs Immature Granulocytes: 0.01 10*3/uL (ref 0.00–0.07)
Basophils Absolute: 0 10*3/uL (ref 0.0–0.1)
Basophils Relative: 0 %
Eosinophils Absolute: 0 10*3/uL (ref 0.0–0.5)
Eosinophils Relative: 1 %
HCT: 32.7 % — ABNORMAL LOW (ref 36.0–46.0)
Hemoglobin: 10.5 g/dL — ABNORMAL LOW (ref 12.0–15.0)
Immature Granulocytes: 0 %
Lymphocytes Relative: 37 %
Lymphs Abs: 1.4 10*3/uL (ref 0.7–4.0)
MCH: 27.6 pg (ref 26.0–34.0)
MCHC: 32.1 g/dL (ref 30.0–36.0)
MCV: 85.8 fL (ref 80.0–100.0)
Monocytes Absolute: 0.5 10*3/uL (ref 0.1–1.0)
Monocytes Relative: 12 %
Neutro Abs: 2 10*3/uL (ref 1.7–7.7)
Neutrophils Relative %: 50 %
Platelets: 248 10*3/uL (ref 150–400)
RBC: 3.81 MIL/uL — ABNORMAL LOW (ref 3.87–5.11)
RDW: 14.6 % (ref 11.5–15.5)
WBC: 3.9 10*3/uL — ABNORMAL LOW (ref 4.0–10.5)
nRBC: 0 % (ref 0.0–0.2)

## 2022-03-05 LAB — BRAIN NATRIURETIC PEPTIDE: B Natriuretic Peptide: 19.9 pg/mL (ref 0.0–100.0)

## 2022-03-05 LAB — TROPONIN I (HIGH SENSITIVITY)
Troponin I (High Sensitivity): 4 ng/L (ref <18)
Troponin I (High Sensitivity): 4 ng/L (ref <18)

## 2022-03-05 MED ORDER — HYDROCORTISONE SOD SUC (PF) 250 MG IJ SOLR: 200.0000 mg | Freq: Once | INTRAMUSCULAR | Status: DC

## 2022-03-05 MED ORDER — IOHEXOL 350 MG/ML SOLN
75.0000 mL | Freq: Once | INTRAVENOUS | Status: AC | PRN
  Administered 2022-03-05: 75 mL via INTRAVENOUS

## 2022-03-05 MED ORDER — DIPHENHYDRAMINE HCL 50 MG/ML IJ SOLN: 50.0000 mg | Freq: Once | INTRAMUSCULAR | Status: DC

## 2022-03-05 MED ORDER — DIPHENHYDRAMINE HCL 25 MG PO CAPS: 50.0000 mg | ORAL_CAPSULE | Freq: Once | ORAL | Status: DC

## 2022-03-05 NOTE — ED Notes (Signed)
ED Provider at bedside. 

## 2022-03-05 NOTE — Telephone Encounter (Signed)
Transition Care Management Follow-up Telephone Call ?Date of discharge and from where: 03/05/2022 [facility].  ?How have you been since you were released from the hospital? Pt states she is tired.  ?Any questions or concerns? No ? ?Items Reviewed: ?Did the pt receive and understand the discharge instructions provided? Yes  ?Medications obtained and verified? Yes  ?Other? Yes  ?Any new allergies since your discharge? No  ?Dietary orders reviewed? Yes ?Do you have support at home? Yes  ? ?Home Care and Equipment/Supplies: ?Were home health services ordered? no ?If so, what is the name of the agency? N/a  ?Has the agency set up a time to come to the patient's home? no ?Were any new equipment or medical supplies ordered?  No ?What is the name of the medical supply agency? N/a ?Were you able to get the supplies/equipment? no ?Do you have any questions related to the use of the equipment or supplies? No ? ?Functional Questionnaire: (I = Independent and D = Dependent) ?ADLs: i ? ?Bathing/Dressing- i ? ?Meal Prep- i ? ?Eating- i ? ?Maintaining continence- i ? ?Transferring/Ambulation- i ? ?Managing Meds- i ? ?Follow up appointments reviewed: ? ?PCP Hospital f/u appt confirmed? No  Scheduled to see n/a on n/a @ n/a. ?[facility] f/u appt confirmed? Yes  Scheduled to see cardiologist on Friday @ n/a. ?Are transportation arrangements needed? No  ?If their condition worsens, is the pt aware to call PCP or go to the Emergency Dept.? Yes ?Was the patient provided with contact information for the PCP's office or ED? Yes ?Was to pt encouraged to call back with questions or concerns? Yes  ?

## 2022-03-05 NOTE — ED Provider Notes (Signed)
?[facility] EMERGENCY DEPARTMENT ?Provider Note ? ? ?CSN: [id number] ?Arrival date & time: 03/04/22  2305 ? ?  ? ?History ? ?Chief Complaint  ?Patient presents with  ? Chest Pain  ? ? ?Christine Mcdowell is a 51 y.o. female. ? ?51 year old female with history of CHF last EF in December was 45-50 and has a pacemaker in place the presents emerged part today with persistent intermittent chest pain.  Patient was here about a week ago similar symptoms.  Chest pain or shortness of breath.  She states that her D-dimer was elevated they wanted to do CT scan but they were controlled with IV and she ended up leaving.  She did not get the CT scan.  She talked to her doctor and they said if they get worse to come back here.  She states that today she was walking and she had recurrent right-sided chest pain associate with shortness of breath.  She states that she has a history of a blood clot in her abdomen after her surgery but no blood clots elsewhere.  She does have a consistently swollen left leg related to an injury there in the past.  She states that she seems to be pretty close to her normal dry weight as she takes metolazone on Monday Wednesday Friday.  Pain is worse with taking a deep breath.  She has not been coughing.  No fever.  No productive cough or ? ? ?Chest Pain ? ?  ? ?Home Medications ?Prior to Admission medications   ?Medication Sig Start Date End Date Taking? Authorizing Provider  ?aspirin 81 MG chewable tablet Chew 162 mg by mouth daily. Taking 2 tablets   Yes [provider]  ?atorvastatin (LIPITOR) 10 MG tablet Take 1 tablet (10 mg total) by mouth daily. 02/11/22  Yes [name], FNP  ?carvedilol (COREG) 25 MG tablet TAKE ONE TABLET BY MOUTH EVERY MORNING and TAKE ONE TABLET BY MOUTH EVERY EVENING 09/17/21  Yes Bensimhon, Shaune Pascal, MD  ?folic acid (FOLVITE) 1 MG tablet TAKE 1 TABLET BY MOUTH EVERY MORNING 01/30/17  Yes Bensimhon, Shaune Pascal, MD  ?Glucagon (GVOKE HYPOPEN 2-PACK) 1 MG/0.2ML  SOAJ Inject 1 mg into the skin as needed. 01/23/21  Yes [name], FNP  ?hydrALAZINE (APRESOLINE) 25 MG tablet Take 1 tablet (25 mg total) by mouth 3 (three) times daily. Please schedule appointment for future refills. Thank you 09/17/21  Yes Evans Lance, MD  ?hydroxychloroquine (PLAQUENIL) 200 MG tablet Take 1 tablet (200 mg total) by mouth 2 (two) times daily. 02/02/19  Yes Bensimhon, Shaune Pascal, MD  ?insulin degludec (TRESIBA FLEXTOUCH) 100 UNIT/ML FlexTouch Pen Inject 10 Units into the skin daily. 12/13/21  Yes [name], FNP  ?insulin lispro (HUMALOG KWIKPEN) 100 UNIT/ML KwikPen Use according to sliding scale 12/13/21  Yes [name], FNP  ?Insulin Pen Needle (NOVOFINE PLUS PEN NEEDLE) 32G X 4 MM MISC Use with insulin pens dx code e11.65 11/13/21  Yes [name], FNP  ?Ipratropium-Albuterol (COMBIVENT RESPIMAT) 20-100 MCG/ACT AERS respimat Inhale 1 puff into the lungs every 6 (six) hours. 10/11/21  Yes [name], [place]  ?Magnesium 400 MG TABS Take 1 tablet by mouth daily.    Yes [provider]  ?metFORMIN (GLUCOPHAGE) 500 MG tablet TAKE ONE TABLET BY MOUTH TWICE DAILY 10/19/21  Yes [name], FNP  ?methotrexate (RHEUMATREX) 2.5 MG tablet Take 20 mg by mouth every Friday.  02/06/15  Yes [provider]  ?metolazone (ZAROXOLYN) 2.5 MG tablet TAKE 1 TABLET  BY MOUTH AS NEEDED FOR WEIGHT GAIN OF 5 POUNDS WITHIN 3 DAYS AS DIRECTED needs appt for further refills 03/16/21  Yes Bensimhon, Shaune Pascal, MD  ?Multiple Vitamin (MULTIVITAMIN WITH MINERALS) TABS Take 1 tablet by mouth every morning.    Yes [provider]  ?potassium chloride (KLOR-CON M) 10 MEQ tablet Take 3 tablets (30 mEq total) by mouth 2 (two) times daily. 12/13/21  Yes [name], FNP  ?PREDNISONE PO Take 5 mg by mouth daily.   Yes [provider]  ?PROAIR HFA 108 (90 Base) MCG/ACT inhaler INHALE 2 PUFFS BY MOUTH EVERY DAY AS NEEDED FOR SHORTNESS OF BREATH 03/07/20  Yes [name], [place]   ?sacubitril-valsartan (ENTRESTO) 97-103 MG Take 1 tablet by mouth 2 (two) times daily. 11/13/21  Yes Bensimhon, Shaune Pascal, MD  ?spironolactone (ALDACTONE) 25 MG tablet TAKE ONE TABLET BY MOUTH EVERY MORNING and TAKE ONE TABLET BY MOUTH EVERY EVENING 01/16/22  Yes Bensimhon, Shaune Pascal, MD  ?torsemide (DEMADEX) 20 MG tablet Take 40 mg by mouth daily as needed.   Yes [provider]  ?traZODone (DESYREL) 50 MG tablet Take 50 mg by mouth at bedtime as needed. 07/05/21  Yes [provider]  ?Continuous Blood Gluc Receiver (DEXCOM G6 RECEIVER) DEVI 1 Device by Does not apply route 3 (three) times daily before meals. 04/12/20   [name], FNP  ?Continuous Blood Gluc Sensor (DEXCOM G6 SENSOR) MISC Inject 1 each into the skin 3 (three) times daily. 04/12/20   [name], FNP  ?Continuous Blood Gluc Transmit (DEXCOM G6 TRANSMITTER) MISC 1 Device by Does not apply route 3 (three) times daily before meals. 04/12/20   [name], FNP  ?ipratropium-albuterol (DUONEB) 0.5-2.5 (3) MG/3ML SOLN Take 3 mLs by nebulization every 6 (six) hours as needed. 10/11/21   [name], FNP  ?ondansetron (ZOFRAN-ODT) 8 MG disintegrating tablet Take 1 tablet (8 mg total) by mouth every 8 (eight) hours as needed for nausea or vomiting. 12/24/21   Lajean Saver, MD  ?PARoxetine (PAXIL) 10 MG tablet Take 1 tablet (10 mg total) by mouth every morning. 02/11/22   [name], FNP  ?Vitamin D, Ergocalciferol, (DRISDOL) 1.25 MG (50000 UNIT) CAPS capsule Take 1 capsule (50,000 Units total) by mouth 2 (two) times a week. 02/11/22   [name], FNP  ?   ? ?Allergies    ?Hydrocodone-acetaminophen, Hydromorphone, Iodinated contrast media, Other, Erythromycin, Latex, Rinvoq [upadacitinib], Farxiga [dapagliflozin], Tape, and Mircette [desogestrel-ethinyl estradiol]   ? ?Review of Systems   ?Review of Systems  ?Cardiovascular:  Positive for chest pain.  ? ?Physical Exam ?Updated Vital Signs ?BP 139/86 (BP Location: Left Arm)   Pulse 79    Temp 97.6 ?F (36.4 ?C) (Oral)   Resp 15   Ht '5\' 7"'$  (1.702 m)   Wt 87.1 kg   SpO2 100%   BMI 30.07 kg/m?  ?Physical Exam ?Vitals and nursing note reviewed.  ?Constitutional:   ?   Appearance: She is well-developed.  ?HENT:  ?   Head: Normocephalic and atraumatic.  ?Cardiovascular:  ?   Rate and Rhythm: Normal rate and regular rhythm.  ?Pulmonary:  ?   Effort: Pulmonary effort is normal. No respiratory distress.  ?   Breath sounds: No stridor.  ?Chest:  ?   Chest wall: No mass or deformity.  ?Abdominal:  ?   General: There is no distension.  ?   Palpations: Abdomen is soft.  ?Musculoskeletal:     ?   General: Normal range of motion.  ?  Cervical back: Normal range of motion.  ?Skin: ?   General: Skin is warm and dry.  ?Neurological:  ?   Mental Status: She is alert.  ? ? ?ED Results / Procedures / Treatments   ?Labs ?(all labs ordered are listed, but only abnormal results are displayed) ?Labs Reviewed  ?BASIC METABOLIC PANEL - Abnormal; Notable for the following components:  ?    Result Value  ? Potassium 3.4 (*)   ? Creatinine, Ser 1.07 (*)   ? Calcium 8.8 (*)   ? All other components within normal limits  ?CBC WITH DIFFERENTIAL/PLATELET - Abnormal; Notable for the following components:  ? WBC 3.9 (*)   ? RBC 3.81 (*)   ? Hemoglobin 10.5 (*)   ? HCT 32.7 (*)   ? All other components within normal limits  ?BRAIN NATRIURETIC PEPTIDE  ?TROPONIN I (HIGH SENSITIVITY)  ?TROPONIN I (HIGH SENSITIVITY)  ? ? ?EKG ?None ? ?Radiology ?CT Angio Chest PE W and/or Wo Contrast ? ?Result Date: 03/05/2022 ?CLINICAL DATA:  51 year old female with chest pain and shortness of breath for 9 days. AICD. EXAM: CT ANGIOGRAPHY CHEST WITH CONTRAST TECHNIQUE: Multidetector CT imaging of the chest was performed using the standard protocol during bolus administration of intravenous contrast. Multiplanar CT image reconstructions and MIPs were obtained to evaluate the vascular anatomy. RADIATION DOSE REDUCTION: This exam was performed  according to the departmental dose-optimization program which includes automated exposure control, adjustment of the mA and/or kV according to patient size and/or use of iterative reconstruction technique. CONTRAST:  67m OMNI

## 2022-03-05 NOTE — ED Notes (Signed)
Awaiting CT scan.

## 2022-03-05 NOTE — ED Notes (Signed)
ED MD to place U/S guided IV access for chest CT  ?

## 2022-03-05 NOTE — ED Notes (Signed)
Patient transported to CT 

## 2022-03-06 ENCOUNTER — Telehealth: Payer: Medicare Other

## 2022-03-06 ENCOUNTER — Ambulatory Visit (INDEPENDENT_AMBULATORY_CARE_PROVIDER_SITE_OTHER): Payer: Medicare Other

## 2022-03-06 ENCOUNTER — Other Ambulatory Visit: Payer: Self-pay | Admitting: Internal Medicine

## 2022-03-06 DIAGNOSIS — I5022 Chronic systolic (congestive) heart failure: Secondary | ICD-10-CM

## 2022-03-06 DIAGNOSIS — M069 Rheumatoid arthritis, unspecified: Secondary | ICD-10-CM

## 2022-03-06 DIAGNOSIS — I10 Essential (primary) hypertension: Secondary | ICD-10-CM

## 2022-03-06 DIAGNOSIS — M329 Systemic lupus erythematosus, unspecified: Secondary | ICD-10-CM

## 2022-03-06 DIAGNOSIS — J4520 Mild intermittent asthma, uncomplicated: Secondary | ICD-10-CM

## 2022-03-06 DIAGNOSIS — E1165 Type 2 diabetes mellitus with hyperglycemia: Secondary | ICD-10-CM

## 2022-03-07 ENCOUNTER — Telehealth (HOSPITAL_COMMUNITY): Payer: Self-pay

## 2022-03-07 NOTE — Chronic Care Management (AMB) (Signed)
?Chronic Care Management  ? ?CCM RN Visit Note ? ?03/06/2022 ?Name: Christine Mcdowell MRN: [id number] DOB: January 05, 1971 ? ?Subjective: ?Christine Mcdowell is a 51 y.o. year old female who is a primary care patient of [name], [place]. The care management team was consulted for assistance with disease management and care coordination needs.   ? ?Engaged with patient by telephone for follow up visit in response to provider referral for case management and/or care coordination services.  ? ?Consent to Services:  ?The patient was given information about Chronic Care Management services, agreed to services, and gave verbal consent prior to initiation of services.  Please see initial visit note for detailed documentation.  ? ?Patient agreed to services and verbal consent obtained.  ? ?Assessment: Review of patient past medical history, allergies, medications, health status, including review of consultants reports, laboratory and other test data, was performed as part of comprehensive evaluation and provision of chronic care management services.  ? ?SDOH (Social Determinants of Health) assessments and interventions performed:  Yes, no acute challenges ? ?CCM Care Plan ? ?Allergies  ?Allergen Reactions  ? Hydrocodone-Acetaminophen Nausea And Vomiting  ? Hydromorphone Nausea And Vomiting  ?  Other reaction(s): GI Upset (intolerance), Hypertension (intolerance) ?Raises blood pressure  ?Other reaction(s): GI Upset (intolerance), Hypertension (intolerance) ?Raises blood pressure to stroke level  ? Iodinated Contrast Media Other (See Comments)  ?  Shuts down kidneys ?Shuts kidney function down  ? Other Other (See Comments) and Anaphylaxis  ?  Spicy foods and seasonings ?Skin Prep "makes my skin peel off" ?Paper tape causes skin burns  ? Erythromycin Nausea And Vomiting  ? Latex Hives  ? Rinvoq [Upadacitinib] Other (See Comments)  ? [place] [Dapagliflozin] Other (See Comments)  ? Tape Other (See Comments)  ?  "Skin burns"  ?  Mircette [Desogestrel-Ethinyl Estradiol] Nausea And Vomiting and Rash  ? ? ?Outpatient Encounter Medications as of 03/06/2022  ?Medication Sig  ? aspirin 81 MG chewable tablet Chew 162 mg by mouth daily. Taking 2 tablets  ? atorvastatin (LIPITOR) 10 MG tablet Take 1 tablet (10 mg total) by mouth daily.  ? carvedilol (COREG) 25 MG tablet TAKE ONE TABLET BY MOUTH EVERY MORNING and TAKE ONE TABLET BY MOUTH EVERY EVENING  ? Continuous Blood Gluc Receiver (DEXCOM G6 RECEIVER) DEVI 1 Device by Does not apply route 3 (three) times daily before meals.  ? Continuous Blood Gluc Sensor (DEXCOM G6 SENSOR) MISC Inject 1 each into the skin 3 (three) times daily.  ? Continuous Blood Gluc Transmit (DEXCOM G6 TRANSMITTER) MISC 1 Device by Does not apply route 3 (three) times daily before meals.  ? folic acid (FOLVITE) 1 MG tablet TAKE 1 TABLET BY MOUTH EVERY MORNING  ? Glucagon (GVOKE HYPOPEN 2-PACK) 1 MG/0.2ML SOAJ Inject 1 mg into the skin as needed.  ? hydrALAZINE (APRESOLINE) 25 MG tablet Take 1 tablet (25 mg total) by mouth 3 (three) times daily. Please schedule appointment for future refills. Thank you  ? hydroxychloroquine (PLAQUENIL) 200 MG tablet Take 1 tablet (200 mg total) by mouth 2 (two) times daily.  ? insulin degludec (TRESIBA FLEXTOUCH) 100 UNIT/ML FlexTouch Pen Inject 10 Units into the skin daily.  ? insulin lispro (HUMALOG KWIKPEN) 100 UNIT/ML KwikPen Use according to sliding scale  ? Insulin Pen Needle (NOVOFINE PLUS PEN NEEDLE) 32G X 4 MM MISC Use with insulin pens dx code e11.65  ? Ipratropium-Albuterol (COMBIVENT RESPIMAT) 20-100 MCG/ACT AERS respimat Inhale 1 puff into the lungs every 6 (six) hours.  ?  ipratropium-albuterol (DUONEB) 0.5-2.5 (3) MG/3ML SOLN Take 3 mLs by nebulization every 6 (six) hours as needed.  ? Magnesium 400 MG TABS Take 1 tablet by mouth daily.   ? metFORMIN (GLUCOPHAGE) 500 MG tablet TAKE ONE TABLET BY MOUTH TWICE DAILY  ? methotrexate (RHEUMATREX) 2.5 MG tablet Take 20 mg by mouth every  Friday.   ? metolazone (ZAROXOLYN) 2.5 MG tablet TAKE 1 TABLET BY MOUTH AS NEEDED FOR WEIGHT GAIN OF 5 POUNDS WITHIN 3 DAYS AS DIRECTED needs appt for further refills  ? Multiple Vitamin (MULTIVITAMIN WITH MINERALS) TABS Take 1 tablet by mouth every morning.   ? ondansetron (ZOFRAN-ODT) 8 MG disintegrating tablet Take 1 tablet (8 mg total) by mouth every 8 (eight) hours as needed for nausea or vomiting.  ? PARoxetine (PAXIL) 10 MG tablet Take 1 tablet (10 mg total) by mouth every morning.  ? potassium chloride (KLOR-CON M) 10 MEQ tablet Take 3 tablets (30 mEq total) by mouth 2 (two) times daily.  ? PREDNISONE PO Take 5 mg by mouth daily.  ? PROAIR HFA 108 (90 Base) MCG/ACT inhaler INHALE 2 PUFFS BY MOUTH EVERY DAY AS NEEDED FOR SHORTNESS OF BREATH  ? sacubitril-valsartan (ENTRESTO) 97-103 MG Take 1 tablet by mouth 2 (two) times daily.  ? spironolactone (ALDACTONE) 25 MG tablet TAKE ONE TABLET BY MOUTH EVERY MORNING and TAKE ONE TABLET BY MOUTH EVERY EVENING  ? torsemide (DEMADEX) 20 MG tablet Take 40 mg by mouth daily as needed.  ? traZODone (DESYREL) 50 MG tablet Take 50 mg by mouth at bedtime as needed.  ? Vitamin D, Ergocalciferol, (DRISDOL) 1.25 MG (50000 UNIT) CAPS capsule Take 1 capsule (50,000 Units total) by mouth 2 (two) times a week.  ? ?No facility-administered encounter medications on file as of 03/06/2022.  ? ? ?Patient Active Problem List  ? Diagnosis Date Noted  ? 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 History of COVID-19 04/12/2020  ? COVID-19 virus infection 03/07/2020  ? Lower abdominal pain 09/07/2019  ? Chronic systolic (congestive) heart failure ([place]) 01/28/2018  ? Cough 01/19/2018  ? Pulmonary infiltrates on CXR 01/19/2018  ? Migraines 10/08/2017  ? Sleep apnea with use of continuous positive airway pressure (CPAP) 09/24/2017  ? Rheumatoid arthritis ([place]) 10/09/2016  ? Asthma 10/09/2016  ? Chronic pain 10/09/2016  ? Heart failure ([place]) 10/09/2016  ? Congestive heart  failure ([place])   ? Lupus (systemic lupus erythematosus) ([place])   ? Diabetes mellitus with complication ([place])   ? Anxiety state   ? GERD (gastroesophageal reflux disease) 12/28/2015  ? Essential hypertension 12/26/2015  ? Type 2 diabetes mellitus ([place]) 12/26/2015  ? Chronic systolic heart failure ([place]) 08/18/2014  ? 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 Shortness of breath 01/26/2014  ? SLE (systemic lupus erythematosus) ([place]) 01/26/2014  ? ? ?Conditions to be addressed/monitored: CHF, DM II, HTN, SLE, RA, Asthma  ? ?Care Plan : RN Care Manager Plan of Care  ?Updates made by [name], RN since 03/06/2022 12:00 AM  ?  ? ?Problem: Chronic disease education and Chronic Care Coordination needs for CHF, DM II, HTN, SLE, RA, Asthma   ?Priority: High  ?  ? ?Long-Range Goal: Assist with Chronic disease education and Care Coordination needs for CHF, DM II, HTN, SLE, RA, Asthma   ?Start Date: 10/16/2021  ?Expected End Date: 10/16/2022  ?Recent Progress: On track  ?Priority: High  ?Note:   ?Current Barriers:  ?Knowledge Deficits related to plan of care for management of CHF,  DM II, HTN, SLE, RA, Asthma  ?Chronic Disease Management support and education needs related to CHF, DM II, HTN, SLE, RA, Asthma  ? ?RNCM Clinical Goal(s):  ?Patient will demonstrate Ongoing health management independence   ?continue to work with RN Care Manager to address care management and care coordination needs related to  CHF, DM II, HTN, SLE, RA, Asthma  ?will demonstrate ongoing self health care management ability    through collaboration with RN Care manager, provider, and care team.  ? ?Interventions: ?1:1 collaboration with primary care provider regarding development and update of comprehensive plan of care as evidenced by provider attestation and co-signature ?Inter-disciplinary care team collaboration (see longitudinal plan of care) ?Evaluation of current treatment plan related to  self management and patient's adherence to plan as  established by provider ? ?Hypertension Interventions:  (Status:  Condition stable.  Not addressed this visit.) Long Term Goal ?Last practice recorded BP readings:  ?BP Readings from Last 3 Encounters:  ?02

## 2022-03-07 NOTE — Patient Instructions (Signed)
Visit Information ? ?Thank you for taking time to visit with me today. Please don't hesitate to contact me if I can be of assistance to you before our next scheduled telephone appointment. ? ?Following are the goals we discussed today:  ?(Copy and paste patient goals from clinical care plan here) ? ?Our next appointment is by telephone on 03/28/22 at 1:30 PM  ? ?Please call the care guide team at [phone] if you need to cancel or reschedule your appointment.  ? ?If you are experiencing a Mental Health or [place] or need someone to talk to, please call [phone]-TALK (toll free, 24 hour hotline)  ? ?Patient verbalizes understanding of instructions and care plan provided today and agrees to view in [facility]. Active MyChart status confirmed with patient.   ? ?Barb Merino, RN, BSN, CCM ?Care Management Coordinator ?[facility]  ?Direct Phone: [phone] ? ? ?

## 2022-03-07 NOTE — Telephone Encounter (Signed)
Called to confirm/remind patient of their appointment at the [facility] on 03/08/22.  ? ?Patient reminded to bring all medications and/or complete list. ? ?Confirmed patient has transportation. Gave directions, instructed to utilize [place] parking. ? ?Confirmed appointment prior to ending call.  ? ?

## 2022-03-08 ENCOUNTER — Telehealth: Payer: Self-pay | Admitting: Physician Assistant

## 2022-03-08 ENCOUNTER — Other Ambulatory Visit: Payer: Self-pay

## 2022-03-08 ENCOUNTER — Encounter (HOSPITAL_COMMUNITY): Payer: Self-pay

## 2022-03-08 ENCOUNTER — Ambulatory Visit (HOSPITAL_COMMUNITY)
Admission: RE | Admit: 2022-03-08 | Discharge: 2022-03-08 | Disposition: A | Discharge status: Home / Self Care | Payer: Medicare Other | Source: Ambulatory Visit | Attending: Family Medicine | Admitting: Family Medicine

## 2022-03-08 VITALS — BP 102/68 | HR 73 | Wt 192.8 lb

## 2022-03-08 DIAGNOSIS — R000 Tachycardia, unspecified: Secondary | ICD-10-CM

## 2022-03-08 DIAGNOSIS — M329 Systemic lupus erythematosus, unspecified: Secondary | ICD-10-CM

## 2022-03-08 DIAGNOSIS — E785 Hyperlipidemia, unspecified: Secondary | ICD-10-CM | POA: Insufficient documentation

## 2022-03-08 DIAGNOSIS — Z8673 Personal history of transient ischemic attack (TIA), and cerebral infarction without residual deficits: Secondary | ICD-10-CM | POA: Diagnosis not present

## 2022-03-08 DIAGNOSIS — R7989 Other specified abnormal findings of blood chemistry: Secondary | ICD-10-CM | POA: Diagnosis not present

## 2022-03-08 DIAGNOSIS — I110 Hypertensive heart disease with heart failure: Secondary | ICD-10-CM | POA: Insufficient documentation

## 2022-03-08 DIAGNOSIS — I493 Ventricular premature depolarization: Secondary | ICD-10-CM | POA: Diagnosis not present

## 2022-03-08 DIAGNOSIS — M25469 Effusion, unspecified knee: Secondary | ICD-10-CM | POA: Diagnosis not present

## 2022-03-08 DIAGNOSIS — Z09 Encounter for follow-up examination after completed treatment for conditions other than malignant neoplasm: Secondary | ICD-10-CM | POA: Diagnosis not present

## 2022-03-08 DIAGNOSIS — G4733 Obstructive sleep apnea (adult) (pediatric): Secondary | ICD-10-CM | POA: Diagnosis not present

## 2022-03-08 DIAGNOSIS — R42 Dizziness and giddiness: Secondary | ICD-10-CM | POA: Diagnosis not present

## 2022-03-08 DIAGNOSIS — L930 Discoid lupus erythematosus: Secondary | ICD-10-CM | POA: Diagnosis not present

## 2022-03-08 DIAGNOSIS — E118 Type 2 diabetes mellitus with unspecified complications: Secondary | ICD-10-CM

## 2022-03-08 DIAGNOSIS — M797 Fibromyalgia: Secondary | ICD-10-CM | POA: Diagnosis not present

## 2022-03-08 DIAGNOSIS — I5022 Chronic systolic (congestive) heart failure: Secondary | ICD-10-CM | POA: Diagnosis not present

## 2022-03-08 DIAGNOSIS — R0602 Shortness of breath: Secondary | ICD-10-CM | POA: Insufficient documentation

## 2022-03-08 DIAGNOSIS — Z8639 Personal history of other endocrine, nutritional and metabolic disease: Secondary | ICD-10-CM | POA: Diagnosis not present

## 2022-03-08 DIAGNOSIS — I10 Essential (primary) hypertension: Secondary | ICD-10-CM

## 2022-03-08 DIAGNOSIS — Z79899 Other long term (current) drug therapy: Secondary | ICD-10-CM | POA: Insufficient documentation

## 2022-03-08 DIAGNOSIS — I428 Other cardiomyopathies: Secondary | ICD-10-CM | POA: Insufficient documentation

## 2022-03-08 DIAGNOSIS — Z7901 Long term (current) use of anticoagulants: Secondary | ICD-10-CM | POA: Insufficient documentation

## 2022-03-08 DIAGNOSIS — D649 Anemia, unspecified: Secondary | ICD-10-CM | POA: Diagnosis not present

## 2022-03-08 DIAGNOSIS — R079 Chest pain, unspecified: Secondary | ICD-10-CM | POA: Diagnosis not present

## 2022-03-08 DIAGNOSIS — E119 Type 2 diabetes mellitus without complications: Secondary | ICD-10-CM | POA: Diagnosis not present

## 2022-03-08 LAB — BASIC METABOLIC PANEL
Anion gap: 10 (ref 5–15)
BUN: 8 mg/dL (ref 6–20)
CO2: 26 mmol/L (ref 22–32)
Calcium: 9.2 mg/dL (ref 8.9–10.3)
Chloride: 108 mmol/L (ref 98–111)
Creatinine, Ser: 0.95 mg/dL (ref 0.44–1.00)
GFR, Estimated: >60 mL/min (ref >60)
Glucose, Bld: 83 mg/dL (ref 70–99)
Potassium: 4.1 mmol/L (ref 3.5–5.1)
Sodium: 144 mmol/L (ref 135–145)

## 2022-03-08 LAB — MAGNESIUM: Magnesium: 1.7 mg/dL (ref 1.7–2.4)

## 2022-03-08 MED ORDER — HYDRALAZINE HCL 25 MG PO TABS: 12.5000 mg | ORAL_TABLET | Freq: Three times a day (TID) | ORAL | 3 refills | Status: DC

## 2022-03-08 NOTE — Progress Notes (Signed)
?  ? ?Advanced Heart Failure Clinic Note ? ? ?Date:  03/08/2022  ? ?ID:  Christine Mcdowell, DOB [birth year], MRN [id number]  Location: Home  ?Provider location: [facility] ?Type of Visit: Established patient ? ?PCP:  [name], FNP  ?EP: Dr. Lovena Le ?Rheum: Dr. Rogers Blocker ?HF Cardiologist:  Glori Bickers, MD ? ?Chief Complaint: Heart Failure follow-up ?  ?History of Present Illness: ? 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  ?  ?Admitted in October 2017 with increased dyspnea and volume overload. Diuresed with IV lasix and transitioned to lasix 40 mg bid. L/RHC normal filling pressures, EF ~20%, and normal coronaries. Discharge weight 184 pounds. ?  ?Cardiac MRI in 02/2015 showed EF 46%. No LGE. ?  ?Echo 8/21: EF ~ 40%-45%, Grade II DD, moderate MR. ? ?Echo 10/09/21 EF 45-50% mild MR ?  ?CPX 9/19 with relaltively normal spirometry. Mild HF limitation.  ? ?She was seen in ED for cough on 01/24/22. Had low grade temp. Tested positive for influenza A and treated with Tamiflu.  ? ?Follow up 2/23 ongoing cough, fatigue and dyspnea since the flu. Seen in ED 02/25/22 and 03/05/22 with SOB & CP. D-dimer elevated, CTA negative for PE.  ? ?Today she returns for post-ER HF follow up. SOB started 2 weeks ago after walking dog. Became SOB, unrelieved with inhaler use. SOB starts after walking for a minute or two, then chest pain starts after this. Occasional dizziness, no falls. Having a lupus flare now, sees Rheum next week. Denies palpitations, abnormal bleeding, edema, or PND/Orthopnea. Right knee swells some. Appetite ok. No fever or chills. Weight at home 192.8 pounds. Taking all medications. Requesting to see Dr. Marin Olp for anemia work up and for persistently low K. Took metolazone 3 weeks ago for weight of  198 lbs, did not take extra KCL with this. Worried about recent low WBC. Has not completed sleep study. ? ?Past Medical History:  ?Diagnosis Date  ? AICD (automatic cardioverter/defibrillator) present 01/28/2018  ? Anemia   ? Anginal pain ([place])   ? Asthma   ? Cervical cancer ([place])   ? cervical 1996  ? CHF (congestive heart failure) ([place])   ? Diabetes mellitus without complication ([place])   ? steroid induced  ? Discoid lupus   ? Fibromyalgia   ? History of blood transfusion "several"  ? "related to anemia; had some w/hysterectomy also"  ? Hx of cardiovascular stress test   ? ETT-Myoview (9/15):  No ischemia, EF 52%; NORMAL  ? Hx of echocardiogram   ? Echo (9/15):  EF 50-55%, ant HK, Gr 1 DD, mild MR, mild LAE, no effusion  ? Hypertension   ? Iron deficiency anemia   ? h/o iron transfusions  ? Lupus (systemic lupus erythematosus) ([place])   ? Migraine   ? "a few/year" (07/03/2016)  ? Pneumonia 12/2015  ? RA (rheumatoid arthritis) ([place])   ? "all over" (07/03/2016)  ? Sickle cell trait ([place])   ? Stroke ([place]) 2014 X 1; 2015 X 2; 2016 X 1;   ? "right side of face more relaxed than the other; rare speech hesitation" (07/03/2016)  ? Vaginal Pap smear, abnormal   ? ASCUS; HPV  ? ?Past Surgical History:  ?Procedure Laterality Date  ? ABDOMINAL HYSTERECTOMY  2009  ? ABDOMINAL WOUND DEHISCENCE  2009  ? BUNIONECTOMY Left  06/15/2020  ? CARDIAC CATHETERIZATION N/A 10/11/2016  ? Procedure: Right/Left Heart Cath and Coronary Angiography;  Surgeon: Jolaine Artist, MD;  Location: [facility];  Service: Cardiovascular;  Laterality: N/A;  ? [place] OF UTERUS  1991  ? HEMATOMA EVACUATION  2009  ? abdomen  ? ICD IMPLANT  01/28/2018  ? ICD IMPLANT N/A 01/28/2018  ? Procedure: ICD IMPLANT;  Surgeon: Evans Lance, MD;  Location: [facility];  Service: Cardiovascular;  Laterality: N/A;  ? INCISE AND DRAIN ABCESS  2009 X 2  ? "abdomen after hysterectomy"  ? KNEE ARTHROSCOPY Right 1997  ? KNEE SURGERY Right   ? TUBAL  LIGATION  1996  ? ?Current Outpatient Medications  ?Medication Sig Dispense Refill  ? aspirin 81 MG chewable tablet Chew 162 mg by mouth daily. Taking 2 tablets    ? atorvastatin (LIPITOR) 10 MG tablet Take 1 tablet (10 mg total) by mouth daily. 90 tablet 2  ? carvedilol (COREG) 25 MG tablet TAKE ONE TABLET BY MOUTH EVERY MORNING and TAKE ONE TABLET BY MOUTH EVERY EVENING 60 tablet 6  ? Continuous Blood Gluc Receiver (DEXCOM G6 RECEIVER) DEVI 1 Device by Does not apply route 3 (three) times daily before meals. 1 each 1  ? Continuous Blood Gluc Sensor (DEXCOM G6 SENSOR) MISC Inject 1 each into the skin 3 (three) times daily. 3 each 1  ? Continuous Blood Gluc Transmit (DEXCOM G6 TRANSMITTER) MISC 1 Device by Does not apply route 3 (three) times daily before meals. 1 each 1  ? folic acid (FOLVITE) 1 MG tablet TAKE 1 TABLET BY MOUTH EVERY MORNING 30 tablet 0  ? Glucagon (GVOKE HYPOPEN 2-PACK) 1 MG/0.2ML SOAJ Inject 1 mg into the skin as needed. 0.2 mL 5  ? hydrALAZINE (APRESOLINE) 25 MG tablet Take 1 tablet (25 mg total) by mouth 3 (three) times daily. Please schedule appointment for future refills. Thank you 270 tablet 3  ? hydroxychloroquine (PLAQUENIL) 200 MG tablet Take 1 tablet (200 mg total) by mouth 2 (two) times daily. 180 tablet 0  ? insulin degludec (TRESIBA FLEXTOUCH) 100 UNIT/ML FlexTouch Pen Inject 10 Units into the skin daily. 9 mL 1  ? insulin lispro (HUMALOG KWIKPEN) 100 UNIT/ML KwikPen Use according to sliding scale 15 mL 11  ? Insulin Pen Needle (NOVOFINE PLUS PEN NEEDLE) 32G X 4 MM MISC Use with insulin pens dx code e11.65 300 each 3  ? Ipratropium-Albuterol (COMBIVENT RESPIMAT) 20-100 MCG/ACT AERS respimat Inhale 1 puff into the lungs every 6 (six) hours. 4 g 5  ? ipratropium-albuterol (DUONEB) 0.5-2.5 (3) MG/3ML SOLN Take 3 mLs by nebulization every 6 (six) hours as needed. 360 mL 2  ? Magnesium 400 MG TABS Take 1 tablet by mouth daily.     ? metFORMIN (GLUCOPHAGE) 500 MG tablet TAKE ONE TABLET BY  MOUTH TWICE DAILY 180 tablet 1  ? methotrexate (RHEUMATREX) 2.5 MG tablet Take 20 mg by mouth every Friday.   3  ? metolazone (ZAROXOLYN) 2.5 MG tablet TAKE 1 TABLET BY MOUTH AS NEEDED FOR WEIGHT GAIN OF 5 POUNDS WITHIN 3 DAYS AS DIRECTED needs appt for further refills 5 tablet 3  ? Multiple Vitamin (MULTIVITAMIN WITH MINERALS) TABS Take 1 tablet by mouth every morning.     ? ondansetron (ZOFRAN-ODT) 8 MG disintegrating tablet Take 1 tablet (8 mg total) by mouth every 8 (eight) hours as needed for nausea or vomiting. 10 tablet 0  ? PARoxetine (PAXIL) 10 MG tablet  Take 1 tablet (10 mg total) by mouth every morning. 30 tablet 2  ? potassium chloride (KLOR-CON M) 10 MEQ tablet Take 3 tablets (30 mEq total) by mouth 2 (two) times daily. 180 tablet 3  ? PREDNISONE PO Take 5 mg by mouth daily.    ? PROAIR HFA 108 (90 Base) MCG/ACT inhaler INHALE 2 PUFFS BY MOUTH EVERY DAY AS NEEDED FOR SHORTNESS OF BREATH 18 g 2  ? sacubitril-valsartan (ENTRESTO) 97-103 MG Take 1 tablet by mouth 2 (two) times daily. 180 tablet 3  ? spironolactone (ALDACTONE) 25 MG tablet TAKE ONE TABLET BY MOUTH EVERY MORNING and TAKE ONE TABLET BY MOUTH EVERY EVENING 60 tablet 3  ? torsemide (DEMADEX) 20 MG tablet Take 40 mg by mouth daily as needed.    ? traZODone (DESYREL) 50 MG tablet Take 50 mg by mouth at bedtime as needed.    ? Vitamin D, Ergocalciferol, (DRISDOL) 1.25 MG (50000 UNIT) CAPS capsule Take 1 capsule (50,000 Units total) by mouth 2 (two) times a week. 12 capsule 3  ? ?No current facility-administered medications for this encounter.  ? ?Allergies:   Hydrocodone-acetaminophen, Hydromorphone, Iodinated contrast media, Other, Erythromycin, Latex, Rinvoq [upadacitinib], Farxiga [dapagliflozin], Tape, and Mircette [desogestrel-ethinyl estradiol]  ? ?Social History:  The patient  reports that she has never smoked. She has never used smokeless tobacco. She reports that she does not drink alcohol and does not use drugs.  ? ?Family History:  The  patient's family history includes Allergies in her father and mother; Arthritis in her mother; Asthma in her maternal grandmother; Cancer in her paternal grandfather; Cushing syndrome in her father; Braulio Conte

## 2022-03-08 NOTE — Patient Instructions (Signed)
Medication Changes: ? ?Decrease Hydralazine to 12.5 mg (1/2 tab) Three times a day  ? ?Take Torsemide 20 mg Daily for 3 days ONLY, then take AS NEEDED for swelling/weight gain ? ?Remember to always take an extra 2 potassium tabs when you take Metolazone ? ?Lab Work: ? ?Labs done today, your results will be available in MyChart, we will contact you for abnormal readings. ? ?Testing/Procedures: ? ?Your physician has requested that you have an echocardiogram. Echocardiography is a painless test that uses sound waves to create images of your heart. It provides your doctor with information about the size and shape of your heart and how well your heart?s chambers and valves are working. This procedure takes approximately one hour. There are no restrictions for this procedure. ? ?Referrals: ? ?You have been referred to Dr Lucas Mallow, his office will call you for an appointment ? ? ?Special Instructions // Education: ? ?Do the following things EVERYDAY: ?Weigh yourself in the morning before breakfast. Write it down and keep it in a log. ?Take your medicines as prescribed ?Eat low salt foods--Limit salt (sodium) to 2000 mg per day.  ?Stay as active as you can everyday ?Limit all fluids for the day to less than 2 liters ? ? ?Follow-Up in: Your physician recommends that you schedule a follow-up appointment in: 4 months (July 2023), **PLEASE CALL OUR OFFICE IN MAY TO SCHEDULE THIS APPOINTMENT ? ? ?At the [facility], you and your health needs are our priority. We have a designated team specialized in the treatment of Heart Failure. This Care Team includes your primary Heart Failure Specialized Cardiologist (physician), Advanced Practice Providers (APPs- Physician Assistants and Nurse Practitioners), and Pharmacist who all work together to provide you with the care you need, when you need it.  ? ?You may see any of the following providers on your designated Care Team at your next follow up: ? ?Dr Glori Bickers ?Dr Loralie Champagne ?Darrick Grinder, NP ?Lyda Jester, PA ?Jessica Milford,NP ?Marlyce Huge, PA ?Audry Riles, PharmD ? ? ?Please be sure to bring in all your medications bottles to every appointment.  ? ?Need to Contact us: ? ?If you have any questions or concerns before your next appointment please send [place] a message through [place] or call our office at [phone].   ? ?TO LEAVE A MESSAGE FOR THE NURSE SELECT OPTION 2, PLEASE LEAVE A MESSAGE INCLUDING: ?YOUR NAME ?DATE OF BIRTH ?CALL BACK NUMBER ?REASON FOR CALL**this is important as we prioritize the call backs ? ?YOU WILL RECEIVE A CALL BACK THE SAME DAY AS LONG AS YOU CALL BEFORE 4:00 PM ? ? ?

## 2022-03-08 NOTE — Telephone Encounter (Signed)
Scheduled appt per 3/17 referral. Pt is aware of appt date and time. Pt is aware to arrive 15 mins prior to appt time and to bring and updated insurance card. Pt is aware of appt location.   ?

## 2022-03-11 DIAGNOSIS — E1165 Type 2 diabetes mellitus with hyperglycemia: Secondary | ICD-10-CM | POA: Diagnosis not present

## 2022-03-11 NOTE — Progress Notes (Signed)
Unable to reach patient for monthly ICM remote follow up and not receiving monthly remote transmission.  Patient disenrolled due patient is not actively participating in ICM clinic.  Device clinic 91 day remote monitoring will continue per protocol.   ?

## 2022-03-12 NOTE — Progress Notes (Signed)
Remote ICD transmission.   

## 2022-03-13 DIAGNOSIS — Z9889 Other specified postprocedural states: Secondary | ICD-10-CM | POA: Diagnosis not present

## 2022-03-13 DIAGNOSIS — M2012 Hallux valgus (acquired), left foot: Secondary | ICD-10-CM | POA: Diagnosis not present

## 2022-03-14 ENCOUNTER — Telehealth: Payer: Self-pay

## 2022-03-14 NOTE — Chronic Care Management (AMB) (Signed)
? ? ?Chronic Care Management ?Pharmacy Assistant  ? ?Name: Christine Mcdowell  MRN: [id number] DOB: 05/15/1971 ? ? ?Reason for Encounter: Medication Review/ Medication Coordination ? ?Recent office visits:  ?03-06-2022 Little, [name], RN (CCM). ? ?02-26-2022 [name], [place]. Shingrix given. Creatinine= 1.07, Sodium= 145. A1C= 7.1. LDL= 120 ? ?Recent consult visits:  ?03-08-2022 Rafael Bihari, FNP (Cardiology). DECREASE Hydralazine 25 mg 3 times daily TO 12.5 mg 3 times daily. Referral placed to Hematology/Oncology. ? ?02-27-2022 Simone Curia, RN (Cardiology). Pacemaker check. ? ?Hospital visits:  ?Medication Reconciliation was completed by comparing discharge summary, patient?s EMR and Pharmacy list, and upon discussion with patient. ? ?Admitted to the hospital on 03-05-2022 due to  Chest pain. Discharge date was 03-05-2022. Discharged from [facility].   ? ?New?Medications Started at [facility] Discharge:?? ?None ? ?Medication Changes at Hospital Discharge: ?None ? ?Medications Discontinued at Hospital Discharge: ?None ? ?Medications that remain the same after Hospital Discharge:??  ?-All other medications will remain the same.   ? ?Hospital visits:  ?Medication Reconciliation was completed by comparing discharge summary, patient?s EMR and Pharmacy list, and upon discussion with patient. ? ?Admitted to the hospital on 02-25-2022 due to Shortness of breath. Discharge date was 02-25-2022. Discharged from [facility].   ? ?New?Medications Started at [facility] Discharge:?? ?None ? ?Medication Changes at Hospital Discharge: ?None ? ?Medications Discontinued at Hospital Discharge: ?None ? ?Medications that remain the same after Hospital Discharge:??  ?-All other medications will remain the same.   ? ?Medications: ?Outpatient Encounter Medications as of 03/14/2022  ?Medication Sig  ? aspirin 81 MG chewable tablet Chew 162 mg by mouth daily. Taking 2 tablets  ? atorvastatin  (LIPITOR) 10 MG tablet Take 1 tablet (10 mg total) by mouth daily.  ? carvedilol (COREG) 25 MG tablet TAKE ONE TABLET BY MOUTH EVERY MORNING and TAKE ONE TABLET BY MOUTH EVERY EVENING  ? Continuous Blood Gluc Receiver (DEXCOM G6 RECEIVER) DEVI 1 Device by Does not apply route 3 (three) times daily before meals.  ? Continuous Blood Gluc Sensor (DEXCOM G6 SENSOR) MISC Inject 1 each into the skin 3 (three) times daily.  ? Continuous Blood Gluc Transmit (DEXCOM G6 TRANSMITTER) MISC 1 Device by Does not apply route 3 (three) times daily before meals.  ? folic acid (FOLVITE) 1 MG tablet TAKE 1 TABLET BY MOUTH EVERY MORNING  ? Glucagon (GVOKE HYPOPEN 2-PACK) 1 MG/0.2ML SOAJ Inject 1 mg into the skin as needed.  ? hydrALAZINE (APRESOLINE) 25 MG tablet Take 0.5 tablets (12.5 mg total) by mouth 3 (three) times daily.  ? hydroxychloroquine (PLAQUENIL) 200 MG tablet Take 1 tablet (200 mg total) by mouth 2 (two) times daily.  ? insulin degludec (TRESIBA FLEXTOUCH) 100 UNIT/ML FlexTouch Pen Inject 10 Units into the skin daily.  ? insulin lispro (HUMALOG KWIKPEN) 100 UNIT/ML KwikPen Use according to sliding scale  ? Insulin Pen Needle (NOVOFINE PLUS PEN NEEDLE) 32G X 4 MM MISC Use with insulin pens dx code e11.65  ? Ipratropium-Albuterol (COMBIVENT RESPIMAT) 20-100 MCG/ACT AERS respimat Inhale 1 puff into the lungs every 6 (six) hours.  ? ipratropium-albuterol (DUONEB) 0.5-2.5 (3) MG/3ML SOLN Take 3 mLs by nebulization every 6 (six) hours as needed.  ? Magnesium 400 MG TABS Take 1 tablet by mouth daily.   ? metFORMIN (GLUCOPHAGE) 500 MG tablet TAKE ONE TABLET BY MOUTH TWICE DAILY  ? methotrexate (RHEUMATREX) 2.5 MG tablet Take 20 mg by mouth every Friday.   ? metolazone (ZAROXOLYN) 2.5  MG tablet TAKE 1 TABLET BY MOUTH AS NEEDED FOR WEIGHT GAIN OF 5 POUNDS WITHIN 3 DAYS AS DIRECTED needs appt for further refills  ? Multiple Vitamin (MULTIVITAMIN WITH MINERALS) TABS Take 1 tablet by mouth every morning.   ? ondansetron (ZOFRAN-ODT)  8 MG disintegrating tablet Take 1 tablet (8 mg total) by mouth every 8 (eight) hours as needed for nausea or vomiting.  ? PARoxetine (PAXIL) 10 MG tablet Take 1 tablet (10 mg total) by mouth every morning.  ? potassium chloride (KLOR-CON M) 10 MEQ tablet Take 3 tablets (30 mEq total) by mouth 2 (two) times daily.  ? PREDNISONE PO Take 5 mg by mouth daily.  ? PROAIR HFA 108 (90 Base) MCG/ACT inhaler INHALE 2 PUFFS BY MOUTH EVERY DAY AS NEEDED FOR SHORTNESS OF BREATH  ? sacubitril-valsartan (ENTRESTO) 97-103 MG Take 1 tablet by mouth 2 (two) times daily.  ? spironolactone (ALDACTONE) 25 MG tablet TAKE ONE TABLET BY MOUTH EVERY MORNING and TAKE ONE TABLET BY MOUTH EVERY EVENING  ? torsemide (DEMADEX) 20 MG tablet Take 40 mg by mouth daily as needed.  ? traZODone (DESYREL) 50 MG tablet Take 50 mg by mouth at bedtime as needed.  ? Vitamin D, Ergocalciferol, (DRISDOL) 1.25 MG (50000 UNIT) CAPS capsule Take 1 capsule (50,000 Units total) by mouth 2 (two) times a week.  ? ?No facility-administered encounter medications on file as of 03/14/2022.  ? ?Reviewed chart for medication changes ahead of medication coordination call. ? ?BP Readings from Last 3 Encounters:  ?03/08/22 102/68  ?03/05/22 139/86  ?02/26/22 130/80  ?  ?Lab Results  ?Component Value Date  ? HGBA1C 7.0 (H) 02/26/2022  ?  ? ?Patient obtains medications through Vials  30 Days  ? ?Last adherence delivery included:  ?Tresiba 100 u/ml- Inject 10 units into skin daily ?Symbicort 80/4.5 mcg- 2 puffs twice daily in morning and at bedtime ?Trazodone 50 mg- 1 tablet at bedtime as needed for sleep ?Spironolactone 25 mg- one tablet twice a day ?Hydralazine 25 mg- 1 tab three times daily ?Metformin 500 mg- one tablet twice a day ?Atorvastatin 10 mg- one tablet daily ?Potassium CL ER 10 Meq- 3 tabs twice daily ?Paroxetine 10 mg- 1 tablet daily ?Diclofenac 1% gel- apply small amount to joints twice daily as needed ?Insulin Lispro (Humalog)- use according to sliding  scale ?Entresto 97 mg/103 mg- one tablet twice a day ?Carvedilol 25 mg- One tablet twice a day ?TruePlus Pen Needles 32 G-use as directed ?Folic acid 1 mg- daily ?Methotrexate 2.5 mg- 8 tabs every Friday ?Vitamin D 50,000 units- one capsules two times a week, Tuesday and Friday ? ?Patient declined (meds) last month: ?None ? ?Patient is due for next adherence delivery on: 03-25-2022 ? ?Called patient and reviewed medications and coordinated delivery. ? ?This delivery to include: ?Symbicort 80/4.5 mcg- 2 puffs twice daily in morning and at bedtime ?Trazodone 50 mg- 1 tablet at bedtime as needed for sleep ?Spironolactone 25 mg- one tablet twice a day ?Hydralazine 25 mg- 1 tab three times daily ?Metformin 500 mg- one tablet twice a day ?Atorvastatin 10 mg- one tablet daily ?Potassium CL ER 10 Meq- 3 tabs twice daily ?Paroxetine 10 mg- 1 tablet daily ?Tresiba 100 u/ml- Inject 10 units into skin daily ?Insulin Lispro (Humalog)- use according to sliding scale ?Entresto 97 mg/103 mg- one tablet twice a day ?Carvedilol 25 mg- One tablet twice a day ?TruePlus Pen Needles 32 G-use as directed ?Folic acid 1 mg- daily ?Methotrexate 2.5 mg- 8  tabs every Friday ?Vitamin D 50,000 units- one capsules two times a week, Tuesday and Friday ? ?No acute/ short fill needed ? ?Patient declined the following medications: ?Hydralazine- Dose change to 12.5 mg TID. RX sent to [facility]. [facility] will [facility]. ? ?Patient needs refills for: ?None ? ?Confirmed delivery date of 03-25-2022 advised patient that pharmacy will contact them the morning of delivery. ? ?Care Gaps: ?Yearly foot exam overdue ?Covid booster overdue ?Yearly Ophthalmology exam overdue ?AWV 10-03-2022 ? ?Star Rating Drugs: ?Atorvastatin 10 mg- Last filled 02-21-2022  30 [facility] ?Entresto 97/103 mg- Last filled 02-21-2022 30 [facility] ?Metformin 500 mg- Last filled 02-21-2022 30 [facility] ? ?Malecca Hicks CMA ?Clinical Pharmacist  Assistant ?[phone] ? ?

## 2022-03-15 DIAGNOSIS — Z79899 Other long term (current) drug therapy: Secondary | ICD-10-CM | POA: Diagnosis not present

## 2022-03-15 DIAGNOSIS — M351 Other overlap syndromes: Secondary | ICD-10-CM | POA: Diagnosis not present

## 2022-03-15 DIAGNOSIS — Z79631 Long term (current) use of antimetabolite agent: Secondary | ICD-10-CM | POA: Diagnosis not present

## 2022-03-15 DIAGNOSIS — Z7952 Long term (current) use of systemic steroids: Secondary | ICD-10-CM | POA: Diagnosis not present

## 2022-03-15 DIAGNOSIS — M329 Systemic lupus erythematosus, unspecified: Secondary | ICD-10-CM | POA: Diagnosis not present

## 2022-03-15 DIAGNOSIS — M069 Rheumatoid arthritis, unspecified: Secondary | ICD-10-CM | POA: Diagnosis not present

## 2022-03-15 DIAGNOSIS — R059 Cough, unspecified: Secondary | ICD-10-CM | POA: Diagnosis not present

## 2022-03-18 ENCOUNTER — Other Ambulatory Visit: Payer: Self-pay | Admitting: Internal Medicine

## 2022-03-20 ENCOUNTER — Other Ambulatory Visit: Payer: Self-pay | Admitting: Physician Assistant

## 2022-03-20 DIAGNOSIS — D649 Anemia, unspecified: Secondary | ICD-10-CM

## 2022-03-20 DIAGNOSIS — E119 Type 2 diabetes mellitus without complications: Secondary | ICD-10-CM | POA: Diagnosis not present

## 2022-03-20 DIAGNOSIS — Z794 Long term (current) use of insulin: Secondary | ICD-10-CM | POA: Diagnosis not present

## 2022-03-20 NOTE — Progress Notes (Signed)
? ? [place] ?[phone]   [phone] ? ?CONSULT NOTE ? ?REFERRING PHYSICIAN: Allena Katz FNP  ? ?REASON FOR CONSULTATION:  ?Anemia ? ?HPI ?Christine Mcdowell is a 51 y.o. 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   ? ?She had an appointment with her cardiology office on 03/08/2022.  The patient was requesting to see Dr. Marin Olp for work-up of her anemia. She had previously seen Dr. Marin Olp in the early 2000's for anemia and hypokalemia. She would require iron infusions. She states she was told that she does not absorb iron. She tried numerous supplements in the past without improvement in her iron. She had lab work performed on 03/08/22 at her cardiologist office which showed normocytic anemia with a hemoglobin of 10.5.  She is referred to the clinic for further evaluation and work-up regarding her anemia.  ? ?The patient has a history of iron deficiency anemia and required IV iron infusions in the past. She estimates her last IV iron infusion was in 2002 when she was 51 years old.  Her last iron studies are from 06/2021 which are within normal limits.  ? ?She has a hematologist at [facility] who she sees for her hypofibrinogenemia. She states she requires cryoprecipitate prior to surgeries and often needs transfusion support following surgeries.  ? ?The oldest records available to me are from 06/03/2007 in which the patient had anemia at that time with a hemoglobin of 7.8. She had severe anemia in 2009. She then had intermittent mild anemia from 2009-until now. Typically, her Hbg has been greater than 10. The patient estimates that she is been anemic since she was born.  She takes K48 and folic acid supplements p.o. daily.  ? ?Regarding symptoms, she reports fatigue. She also has lightheadedness. She reports dyspnea on exertion but attributes that to her CHF. She sees Dr. Haroldine Laws.  Denies any abnormal bleeding or bruising. She had a hysterectomy in 2012.  Denies any blood thinner use except for a 81 mg aspirin.  She has never had a colonoscopy but it looks like she had a Cologuard test on 12/18/2021 which was negative. Denies epistaxis, gingival bleeding, hemoptysis, hematemesis, melena, or hematochezia. Denies any chills, unexplained weight loss, or lymphadenopathy. She has some hot flashes/night sweats due to going through menopause.  Denies any NSAID use. She used to crave ice ships when she was iron deficiency in the past but denies any recent craving of ice chips. Denies any chronic kidney disease.  She denies any particular dietary habits such as being a vegan or vegetarian; however, she has a heart healthy diet and diabetic diet.  Denies any history of any bariatric surgery. ? ? ?Her mother was also anemic as well as her daughter. She states she has sickle cell trait. Her father had Cushing's disease. She denies family history of CRC.  ? ?The patient is divorced. She has 4 children. She denies smoking, alcohol, or tobacco use.  ? ? ?HPI ? ?Past Medical History:  ?Diagnosis Date  ? AICD (automatic cardioverter/defibrillator) present 01/28/2018  ? Anemia   ? Anginal pain ([place])   ? Asthma   ? Cervical cancer ([place])   ? cervical 1996  ? CHF (congestive heart failure) ([place])   ? Diabetes mellitus  without complication ([place])   ? steroid induced  ? Discoid lupus   ? Fibromyalgia   ? History of blood transfusion "several"  ? "related to anemia; had some w/hysterectomy also"  ? Hx of cardiovascular stress test   ? ETT-Myoview (9/15):  No ischemia, EF 52%; NORMAL  ? Hx of echocardiogram   ? Echo (9/15):  EF 50-55%, ant HK, Gr 1 DD, mild MR, mild LAE, no effusion  ? Hypertension   ? Iron  deficiency anemia   ? h/o iron transfusions  ? Lupus (systemic lupus erythematosus) ([place])   ? Migraine   ? "a few/year" (07/03/2016)  ? Pneumonia 12/2015  ? RA (rheumatoid arthritis) ([place])   ? "all over" (07/03/2016)  ? Sickle cell trait ([place])   ? Stroke ([place]) 2014 X 1; 2015 X 2; 2016 X 1;   ? "right side of face more relaxed than the other; rare speech hesitation" (07/03/2016)  ? Vaginal Pap smear, abnormal   ? ASCUS; HPV  ? ? ?Past Surgical History:  ?Procedure Laterality Date  ? ABDOMINAL HYSTERECTOMY  2009  ? ABDOMINAL WOUND DEHISCENCE  2009  ? BUNIONECTOMY Left 06/15/2020  ? CARDIAC CATHETERIZATION N/A 10/11/2016  ? Procedure: Right/Left Heart Cath and Coronary Angiography;  Surgeon: Jolaine Artist, MD;  Location: [place] [facility];  Service: Cardiovascular;  Laterality: N/A;  ? [place] OF UTERUS  1991  ? HEMATOMA EVACUATION  2009  ? abdomen  ? ICD IMPLANT  01/28/2018  ? ICD IMPLANT N/A 01/28/2018  ? Procedure: ICD IMPLANT;  Surgeon: Evans Lance, MD;  Location: [place] [facility];  Service: Cardiovascular;  Laterality: N/A;  ? INCISE AND DRAIN ABCESS  2009 X 2  ? "abdomen after hysterectomy"  ? KNEE ARTHROSCOPY Right 1997  ? KNEE SURGERY Right   ? TUBAL LIGATION  1996  ? ? ?Family History  ?Problem Relation Age of Onset  ? Arthritis Mother   ? Heart murmur Mother   ? Drug abuse Mother   ? Allergies Mother   ? Heart attack Father   ? Cushing syndrome Father   ? Depression Father   ? Allergies Father   ? Dementia Paternal Grandmother   ? Cancer Paternal Grandfather   ? Diabetes Maternal Grandmother   ? Hypertension Maternal Grandmother   ? Asthma Maternal Grandmother   ? Heart attack Maternal Grandfather   ? Breast cancer Neg Hx   ? ? ?Social History ?Social History  ? ?Tobacco Use  ? Smoking status: Never  ? Smokeless tobacco: Never  ?Vaping Use  ? Vaping Use: Never used  ?Substance Use Topics  ? Alcohol use: No  ? Drug use: No  ? ? ?Allergies  ?Allergen Reactions  ? Hydrocodone-Acetaminophen  Nausea And Vomiting  ? Hydromorphone Nausea And Vomiting  ?  Other reaction(s): GI Upset (intolerance), Hypertension (intolerance) ?Raises blood pressure  ?Other reaction(s): GI Upset (intolerance), Hypertension (intolerance) ?Raises blood pressure to stroke level  ? Iodinated Contrast Media Other (See Comments)  ?  Shuts down kidneys ?Shuts kidney function down  ? Other Other (See Comments) and Anaphylaxis  ?  Spicy foods and seasonings ?Skin Prep "makes my skin peel off" ?Paper tape causes skin burns  ? Erythromycin Nausea And Vomiting  ? Latex Hives  ? Rinvoq [Upadacitinib] Other (See Comments)  ? Wilder Glade [Dapagliflozin] Other (See Comments)  ? Tape Other (See Comments)  ?  "Skin burns"  ? Mircette [Desogestrel-Ethinyl Estradiol] Nausea And Vomiting and Rash  ? ? ?  Current Outpatient Medications  ?Medication Sig Dispense Refill  ? aspirin 81 MG chewable tablet Chew 162 mg by mouth daily. Taking 2 tablets    ? atorvastatin (LIPITOR) 10 MG tablet Take 1 tablet (10 mg total) by mouth daily. 90 tablet 2  ? carvedilol (COREG) 25 MG tablet TAKE ONE TABLET BY MOUTH EVERY MORNING and TAKE ONE TABLET BY MOUTH EVERY EVENING 60 tablet 6  ? Continuous Blood Gluc Receiver (DEXCOM G6 RECEIVER) DEVI 1 Device by Does not apply route 3 (three) times daily before meals. 1 each 1  ? Continuous Blood Gluc Sensor (DEXCOM G6 SENSOR) MISC Inject 1 each into the skin 3 (three) times daily. 3 each 1  ? Continuous Blood Gluc Transmit (DEXCOM G6 TRANSMITTER) MISC 1 Device by Does not apply route 3 (three) times daily before meals. 1 each 1  ? folic acid (FOLVITE) 1 MG tablet TAKE 1 TABLET BY MOUTH EVERY MORNING 30 tablet 0  ? Glucagon (GVOKE HYPOPEN 2-PACK) 1 MG/0.2ML SOAJ Inject 1 mg into the skin as needed. 0.2 mL 5  ? hydrALAZINE (APRESOLINE) 25 MG tablet Take 0.5 tablets (12.5 mg total) by mouth 3 (three) times daily. 135 tablet 3  ? hydroxychloroquine (PLAQUENIL) 200 MG tablet Take 1 tablet (200 mg total) by mouth 2 (two) times daily.  180 tablet 0  ? insulin degludec (TRESIBA FLEXTOUCH) 100 UNIT/ML FlexTouch Pen Inject 10 Units into the skin daily. 9 mL 1  ? insulin lispro (HUMALOG KWIKPEN) 100 UNIT/ML KwikPen Use according to sli

## 2022-03-21 ENCOUNTER — Inpatient Hospital Stay: Payer: Medicare Other

## 2022-03-21 ENCOUNTER — Inpatient Hospital Stay: Payer: Medicare Other | Attending: Physician Assistant | Admitting: Physician Assistant

## 2022-03-21 ENCOUNTER — Other Ambulatory Visit: Payer: Self-pay

## 2022-03-21 DIAGNOSIS — J45909 Unspecified asthma, uncomplicated: Secondary | ICD-10-CM | POA: Diagnosis not present

## 2022-03-21 DIAGNOSIS — G4733 Obstructive sleep apnea (adult) (pediatric): Secondary | ICD-10-CM | POA: Insufficient documentation

## 2022-03-21 DIAGNOSIS — Z832 Family history of diseases of the blood and blood-forming organs and certain disorders involving the immune mechanism: Secondary | ICD-10-CM

## 2022-03-21 DIAGNOSIS — Z79899 Other long term (current) drug therapy: Secondary | ICD-10-CM | POA: Diagnosis not present

## 2022-03-21 DIAGNOSIS — M329 Systemic lupus erythematosus, unspecified: Secondary | ICD-10-CM | POA: Diagnosis not present

## 2022-03-21 DIAGNOSIS — Z809 Family history of malignant neoplasm, unspecified: Secondary | ICD-10-CM | POA: Diagnosis not present

## 2022-03-21 DIAGNOSIS — D573 Sickle-cell trait: Secondary | ICD-10-CM | POA: Insufficient documentation

## 2022-03-21 DIAGNOSIS — D649 Anemia, unspecified: Secondary | ICD-10-CM | POA: Insufficient documentation

## 2022-03-21 DIAGNOSIS — D688 Other specified coagulation defects: Secondary | ICD-10-CM | POA: Diagnosis not present

## 2022-03-21 DIAGNOSIS — M069 Rheumatoid arthritis, unspecified: Secondary | ICD-10-CM | POA: Insufficient documentation

## 2022-03-21 LAB — CBC WITH DIFFERENTIAL (CANCER CENTER ONLY)
Abs Immature Granulocytes: 0.06 10*3/uL (ref 0.00–0.07)
Basophils Absolute: 0 10*3/uL (ref 0.0–0.1)
Basophils Relative: 0 %
Eosinophils Absolute: 0 10*3/uL (ref 0.0–0.5)
Eosinophils Relative: 0 %
HCT: 31.8 % — ABNORMAL LOW (ref 36.0–46.0)
Hemoglobin: 10.3 g/dL — ABNORMAL LOW (ref 12.0–15.0)
Immature Granulocytes: 1 %
Lymphocytes Relative: 18 %
Lymphs Abs: 0.9 10*3/uL (ref 0.7–4.0)
MCH: 27.6 pg (ref 26.0–34.0)
MCHC: 32.4 g/dL (ref 30.0–36.0)
MCV: 85.3 fL (ref 80.0–100.0)
Monocytes Absolute: 0.2 10*3/uL (ref 0.1–1.0)
Monocytes Relative: 3 %
Neutro Abs: 4.2 10*3/uL (ref 1.7–7.7)
Neutrophils Relative %: 78 %
Platelet Count: 247 10*3/uL (ref 150–400)
RBC: 3.73 MIL/uL — ABNORMAL LOW (ref 3.87–5.11)
RDW: 14.6 % (ref 11.5–15.5)
WBC Count: 5.4 10*3/uL (ref 4.0–10.5)
nRBC: 0 % (ref 0.0–0.2)

## 2022-03-21 LAB — CMP (CANCER CENTER ONLY)
ALT: 10 U/L (ref 0–44)
AST: 12 U/L — ABNORMAL LOW (ref 15–41)
Albumin: 4.5 g/dL (ref 3.5–5.0)
Alkaline Phosphatase: 48 U/L (ref 38–126)
Anion gap: 9 (ref 5–15)
BUN: 15 mg/dL (ref 6–20)
CO2: 27 mmol/L (ref 22–32)
Calcium: 9.2 mg/dL (ref 8.9–10.3)
Chloride: 106 mmol/L (ref 98–111)
Creatinine: 1.01 mg/dL — ABNORMAL HIGH (ref 0.44–1.00)
GFR, Estimated: >60 mL/min (ref >60)
Glucose, Bld: 160 mg/dL — ABNORMAL HIGH (ref 70–99)
Potassium: 3.6 mmol/L (ref 3.5–5.1)
Sodium: 142 mmol/L (ref 135–145)
Total Bilirubin: 0.3 mg/dL (ref 0.3–1.2)
Total Protein: 7.4 g/dL (ref 6.5–8.1)

## 2022-03-21 LAB — IRON AND IRON BINDING CAPACITY (CC-WL,HP ONLY)
Iron: 100 ug/dL (ref 28–170)
Saturation Ratios: 27 % (ref 10.4–31.8)
TIBC: 365 ug/dL (ref 250–450)
UIBC: 265 ug/dL (ref 148–442)

## 2022-03-21 LAB — FOLATE: Folate: 64.1 ng/mL (ref >5.9)

## 2022-03-21 LAB — SAMPLE TO BLOOD BANK

## 2022-03-21 LAB — VITAMIN B12: Vitamin B-12: 2127 pg/mL — ABNORMAL HIGH (ref 180–914)

## 2022-03-21 LAB — FERRITIN: Ferritin: 64 ng/mL (ref 11–307)

## 2022-03-22 ENCOUNTER — Telehealth: Payer: Self-pay

## 2022-03-22 DIAGNOSIS — I5022 Chronic systolic (congestive) heart failure: Secondary | ICD-10-CM

## 2022-03-22 DIAGNOSIS — J4520 Mild intermittent asthma, uncomplicated: Secondary | ICD-10-CM

## 2022-03-22 DIAGNOSIS — I10 Essential (primary) hypertension: Secondary | ICD-10-CM

## 2022-03-22 DIAGNOSIS — M069 Rheumatoid arthritis, unspecified: Secondary | ICD-10-CM

## 2022-03-22 DIAGNOSIS — E1165 Type 2 diabetes mellitus with hyperglycemia: Secondary | ICD-10-CM

## 2022-03-22 NOTE — Telephone Encounter (Signed)
I spoke with pt and advised as indicated. Pt expressed understanding of this information. 

## 2022-03-22 NOTE — Telephone Encounter (Signed)
-----   Message from Gracelyn Nurse, PA-C sent at 03/21/2022  2:53 PM EDT ----- ?No IV iron. We will see her in 3 months as planned and repeat labs at that time. Can you call her and let her know?  ?----- Message ----- ?From: Interface, Lab In [facility] ?Sent: 03/21/2022   1:25 PM EDT ?To: Cassandra L Heilingoetter, PA-C ? ? ?

## 2022-03-25 LAB — PROTEIN ELECTROPHORESIS, SERUM, WITH REFLEX
A/G Ratio: 1.3 (ref 0.7–1.7)
Albumin ELP: 4.1 g/dL (ref 2.9–4.4)
Alpha-1-Globulin: 0.2 g/dL (ref 0.0–0.4)
Alpha-2-Globulin: 0.6 g/dL (ref 0.4–1.0)
Beta Globulin: 0.9 g/dL (ref 0.7–1.3)
Gamma Globulin: 1.4 g/dL (ref 0.4–1.8)
Globulin, Total: 3.1 g/dL (ref 2.2–3.9)
Total Protein ELP: 7.2 g/dL (ref 6.0–8.5)

## 2022-03-28 ENCOUNTER — Telehealth: Payer: Medicare Other

## 2022-03-28 ENCOUNTER — Telehealth: Payer: Self-pay

## 2022-03-28 NOTE — Telephone Encounter (Signed)
?  Care Management  ? ?Follow Up Note ? ? ?03/28/2022 ?Name: Christine Mcdowell MRN: [id number] DOB: [birth year] ? ? ?Referred by: [name], FNP ?Reason for referral : Chronic Care Management (RN CM Follow up call ) ? ? ?An unsuccessful telephone outreach was attempted today. The patient was referred to the case management team for assistance with care management and care coordination.  ? ?Follow Up Plan: Telephone follow up appointment with care management team member scheduled for: 04/16/22 ? ?Barb Merino, RN, BSN, CCM ?Care Management Coordinator ?[place] [facility]  ?Direct Phone: [phone] ? ? ?

## 2022-04-08 ENCOUNTER — Ambulatory Visit (HOSPITAL_COMMUNITY)
Admission: RE | Admit: 2022-04-08 | Discharge: 2022-04-08 | Disposition: A | Discharge status: Home / Self Care | Payer: Medicare Other | Source: Ambulatory Visit | Attending: Nurse Practitioner | Admitting: Nurse Practitioner

## 2022-04-08 DIAGNOSIS — I5022 Chronic systolic (congestive) heart failure: Secondary | ICD-10-CM

## 2022-04-08 DIAGNOSIS — I081 Rheumatic disorders of both mitral and tricuspid valves: Secondary | ICD-10-CM | POA: Insufficient documentation

## 2022-04-08 LAB — ECHOCARDIOGRAM COMPLETE
AR max vel: 2.97 cm2
AV Peak grad: 5.9 mmHg
Ao pk vel: 1.22 m/s
Area-P 1/2: 3.72 cm2
S' Lateral: 4.3 cm
Single Plane A4C EF: 42.2 %

## 2022-04-10 ENCOUNTER — Other Ambulatory Visit (HOSPITAL_COMMUNITY): Payer: Self-pay | Admitting: Internal Medicine

## 2022-04-11 DIAGNOSIS — E1165 Type 2 diabetes mellitus with hyperglycemia: Secondary | ICD-10-CM | POA: Diagnosis not present

## 2022-04-12 ENCOUNTER — Telehealth: Payer: Self-pay

## 2022-04-12 ENCOUNTER — Other Ambulatory Visit: Payer: Self-pay

## 2022-04-12 ENCOUNTER — Other Ambulatory Visit (HOSPITAL_COMMUNITY): Payer: Self-pay | Admitting: Cardiology

## 2022-04-12 MED ORDER — CARVEDILOL 25 MG PO TABS: 25.0000 mg | ORAL_TABLET | Freq: Two times a day (BID) | ORAL | 6 refills | Status: DC

## 2022-04-12 MED ORDER — METFORMIN HCL 500 MG PO TABS: 500.0000 mg | ORAL_TABLET | Freq: Two times a day (BID) | ORAL | 1 refills | Status: DC

## 2022-04-12 MED ORDER — MAGNESIUM 400 MG PO TABS: 1.0000 | ORAL_TABLET | Freq: Every day | ORAL | 1 refills | Status: AC

## 2022-04-12 NOTE — Chronic Care Management (AMB) (Addendum)
Chronic Care Management Pharmacy Assistant   Name: Christine Mcdowell  MRN: [id number] DOB: [birth year]   Reason for Encounter: Medication Review/ Medication coordination   Recent office visits:  None  Recent consult visits:  04-08-2022 Echocardiogram completed.  03-21-2022 Heilingoetter, Cassandra L, PA-C (Oncology). INCREASE Prednisone 5 mg daily TO 10 mg daily.  Hospital visits:  Medication Reconciliation was completed by comparing discharge summary, patient's EMR and Pharmacy list, and upon discussion with patient.   Admitted to the hospital on 03-05-2022 due to  Chest pain. Discharge date was 03-05-2022. Discharged from [place]?Medications Started at [facility] Discharge:?? None   Medication Changes at Hospital Discharge: None   Medications Discontinued at Hospital Discharge: None   Medications that remain the same after Hospital Discharge:??  -All other medications will remain the same.     Hospital visits:  Medication Reconciliation was completed by comparing discharge summary, patient's EMR and Pharmacy list, and upon discussion with patient.   Admitted to the hospital on 02-25-2022 due to Shortness of breath. Discharge date was 02-25-2022. Discharged from [place]?Medications Started at [facility] Discharge:?? None   Medication Changes at Hospital Discharge: None   Medications Discontinued at Hospital Discharge: None   Medications that remain the same after Hospital Discharge:??  -All other medications will remain the same.      Medications: Outpatient Encounter Medications as of 04/12/2022  Medication Sig   aspirin 81 MG chewable tablet Chew 162 mg by mouth daily. Taking 2 tablets   atorvastatin (LIPITOR) 10 MG tablet Take 1 tablet (10 mg total) by mouth daily.   carvedilol (COREG) 25 MG tablet TAKE ONE TABLET BY MOUTH EVERY MORNING and TAKE ONE TABLET BY MOUTH EVERY EVENING   Continuous  Blood Gluc Receiver (DEXCOM G6 RECEIVER) DEVI 1 Device by Does not apply route 3 (three) times daily before meals.   Continuous Blood Gluc Sensor (DEXCOM G6 SENSOR) MISC Inject 1 each into the skin 3 (three) times daily.   Continuous Blood Gluc Transmit (DEXCOM G6 TRANSMITTER) MISC 1 Device by Does not apply route 3 (three) times daily before meals.   diclofenac Sodium (VOLTAREN) 1 % GEL apply A SMALL AMOUNT TO involved joints UP TO TWICE DAILY   folic acid (FOLVITE) 1 MG tablet TAKE 1 TABLET BY MOUTH EVERY MORNING   furosemide (LASIX) 40 MG tablet Take by mouth.   Glucagon (GVOKE HYPOPEN 2-PACK) 1 MG/0.2ML SOAJ Inject 1 mg into the skin as needed.   hydrALAZINE (APRESOLINE) 25 MG tablet Take 0.5 tablets (12.5 mg total) by mouth 3 (three) times daily.   hydroxychloroquine (PLAQUENIL) 200 MG tablet Take 1 tablet (200 mg total) by mouth 2 (two) times daily.   insulin degludec (TRESIBA FLEXTOUCH) 100 UNIT/ML FlexTouch Pen Inject 10 Units into the skin daily.   insulin lispro (HUMALOG KWIKPEN) 100 UNIT/ML KwikPen Use according to sliding scale   Insulin Pen Needle (NOVOFINE PLUS PEN NEEDLE) 32G X 4 MM MISC Use with insulin pens dx code e11.65   Ipratropium-Albuterol (COMBIVENT RESPIMAT) 20-100 MCG/ACT AERS respimat Inhale 1 puff into the lungs every 6 (six) hours.   ipratropium-albuterol (DUONEB) 0.5-2.5 (3) MG/3ML SOLN Take 3 mLs by nebulization every 6 (six) hours as needed.   Lactobacillus (ACIDOPHILUS) CAPS capsule Take 1 capsule by mouth daily.   Magnesium 400 MG TABS Take 1 tablet by mouth daily.    metFORMIN (GLUCOPHAGE) 500 MG tablet TAKE ONE TABLET BY MOUTH  TWICE DAILY   metFORMIN (GLUCOPHAGE) 500 MG tablet Take by mouth.   methotrexate (RHEUMATREX) 2.5 MG tablet Take 20 mg by mouth every Friday.    metolazone (ZAROXOLYN) 2.5 MG tablet TAKE 1 TABLET BY MOUTH AS NEEDED FOR WEIGHT GAIN OF 5 POUNDS WITHIN 3 DAYS AS DIRECTED needs appt for further refills   Multiple Vitamin (MULTIVITAMIN WITH  MINERALS) TABS Take 1 tablet by mouth every morning.    OLUMIANT tablet Take 2 mg by mouth daily.   ondansetron (ZOFRAN-ODT) 8 MG disintegrating tablet Take 1 tablet (8 mg total) by mouth every 8 (eight) hours as needed for nausea or vomiting.   PARoxetine (PAXIL) 10 MG tablet Take 1 tablet (10 mg total) by mouth every morning.   potassium chloride (KLOR-CON M) 10 MEQ tablet Take 3 tablets (30 mEq total) by mouth 2 (two) times daily.   predniSONE (DELTASONE) 10 MG tablet Take 10 mg by mouth daily.   PROAIR HFA 108 (90 Base) MCG/ACT inhaler INHALE 2 PUFFS BY MOUTH EVERY DAY AS NEEDED FOR SHORTNESS OF BREATH   sacubitril-valsartan (ENTRESTO) 97-103 MG Take 1 tablet by mouth 2 (two) times daily.   spironolactone (ALDACTONE) 25 MG tablet TAKE ONE TABLET BY MOUTH EVERY MORNING and TAKE ONE TABLET BY MOUTH EVERY EVENING   torsemide (DEMADEX) 20 MG tablet Take 40 mg by mouth daily as needed.   traZODone (DESYREL) 50 MG tablet Take 50 mg by mouth at bedtime as needed.   Vitamin D, Ergocalciferol, (DRISDOL) 1.25 MG (50000 UNIT) CAPS capsule Take 1 capsule (50,000 Units total) by mouth 2 (two) times a week.   No facility-administered encounter medications on file as of 04/12/2022.   Reviewed chart for medication changes ahead of medication coordination call.  No medication changes indicated OR if recent visit, treatment plan here.  BP Readings from Last 3 Encounters:  03/21/22 (!) 128/91  03/08/22 102/68  03/05/22 139/86    Lab Results  Component Value Date   HGBA1C 7.0 (H) 02/26/2022     Patient obtains medications through Vials  30 Days   Last adherence delivery included:  Symbicort 80/4.5 mcg- 2 puffs twice daily in morning and at bedtime Trazodone 50 mg- 1 tablet at bedtime as needed for sleep Spironolactone 25 mg- one tablet twice a day Hydralazine 25 mg- 1 tab three times daily Metformin 500 mg- one tablet twice a day Atorvastatin 10 mg- one tablet daily Potassium CL ER 10 Meq- 3  tabs twice daily Paroxetine 10 mg- 1 tablet daily Tresiba 100 u/ml- Inject 10 units into skin daily Insulin Lispro (Humalog)- use according to sliding scale Entresto 97 mg/103 mg- one tablet twice a day Carvedilol 25 mg- One tablet twice a day TruePlus Pen Needles 32 G-use as directed Folic acid 1 mg- daily Methotrexate 2.5 mg- 8 tabs every Friday Vitamin D 50,000 units- one capsules two times a week, Tuesday and Friday  Patient declined (meds) last month: Hydralazine- Dose change to 12.5 mg TID. RX sent to [facility]. Patient will call to transfer rx to [facility] for next delivery.  Patient is due for next adherence delivery on: 04-23-2022  Called patient and reviewed medications and coordinated delivery.  This delivery to include: Insulin Lispro (Humalog)- use according to sliding scale Entresto 97 mg/103 mg- one tablet twice a day TruePlus Pen Needles 32 G-use as directed Vitamin D 50,000 units- one capsules two times a week, Tuesday and Friday Atorvastatin 10 mg- one tablet daily Folic acid 1 mg- daily Symbicort 80/4.5 mcg-  2 puffs twice daily in morning and at bedtime Tresiba 100 u/ml- Inject 10 units into skin daily Paroxetine 10 mg- 1 tablet daily Spironolactone 25 mg- one tablet twice a day Methotrexate 2.5 mg- 8 tabs every Friday Trazodone 50 mg- 1 tablet at bedtime as needed for sleep Carvedilol 25 mg- One tablet twice a day Metformin 500 mg- one tablet twice a day Potassium CL ER 10 Meq- 3 tabs twice daily Hydralazine 12.5 mg 3 times daily ADD Torsemide 20 mg twice daily  No short/acute fill needed  Patient declined the following medications: None  Patient needs refills for: Refill request sent Hydralazine- patient is calling [facility] to transfer Potassium chloride Metformin Carvedilol- Left VM for Dr. Haroldine Laws  Confirmed delivery date of 04-23-2022 advised patient that pharmacy will contact them the morning of delivery.  04-16-2022: Received message from  [place] at [facility] requesting hydralazine transfer from [facility]. Contacted patient and she stated she wants to keep medication at [facility] now because it may change soon. [place], Maricela Bo, FNP office to and left a voicemail to clarify Torsemide dosing/ instructions. Patient stated she forgot the mmedication may have been switched to MWF. Updated [place].  [place] Pharmacist Assistant [phone]

## 2022-04-16 ENCOUNTER — Encounter (HOSPITAL_COMMUNITY): Payer: Self-pay | Admitting: Internal Medicine

## 2022-04-16 ENCOUNTER — Telehealth: Payer: Medicare Other

## 2022-04-16 ENCOUNTER — Ambulatory Visit (INDEPENDENT_AMBULATORY_CARE_PROVIDER_SITE_OTHER): Payer: Medicare Other

## 2022-04-16 ENCOUNTER — Other Ambulatory Visit: Payer: Self-pay | Admitting: Nurse Practitioner

## 2022-04-16 DIAGNOSIS — J4520 Mild intermittent asthma, uncomplicated: Secondary | ICD-10-CM

## 2022-04-16 DIAGNOSIS — I10 Essential (primary) hypertension: Secondary | ICD-10-CM

## 2022-04-16 DIAGNOSIS — M069 Rheumatoid arthritis, unspecified: Secondary | ICD-10-CM

## 2022-04-16 DIAGNOSIS — I5022 Chronic systolic (congestive) heart failure: Secondary | ICD-10-CM

## 2022-04-16 DIAGNOSIS — E1165 Type 2 diabetes mellitus with hyperglycemia: Secondary | ICD-10-CM

## 2022-04-16 DIAGNOSIS — M329 Systemic lupus erythematosus, unspecified: Secondary | ICD-10-CM

## 2022-04-17 NOTE — Patient Instructions (Signed)
Visit Information ? ?Thank you for taking time to visit with me today. Please don't hesitate to contact me if I can be of assistance to you before our next scheduled telephone appointment. ? ?Following are the goals we discussed today:  ?(Copy and paste patient goals from clinical care plan here) ? ?Our next appointment is by telephone on 07/04/22 at 11:15 AM  ? ?Please call the care guide team at [phone] if you need to cancel or reschedule your appointment.  ? ?If you are experiencing a Mental Health or [place] or need someone to talk to, please call [phone]-TALK (toll free, 24 hour hotline)  ? ?Patient verbalizes understanding of instructions and care plan provided today and agrees to view in [place]. Active MyChart status confirmed with patient.   ? ?Barb Merino, RN, BSN, CCM ?Care Management Coordinator ?[place] [facility]  ?Direct Phone: [phone] ? ? ?

## 2022-04-17 NOTE — Chronic Care Management (AMB) (Signed)
?Chronic Care Management  ? ?CCM RN Visit Note ? ?04/16/2022 ?Name: Christine Mcdowell MRN: [id number] DOB: 27-Jul-1971 ? ?Subjective: ?Christine Mcdowell is a 51 y.o. year old female who is a primary care patient of [name], [facility]. The care management team was consulted for assistance with disease management and care coordination needs.   ? ?Engaged with patient by telephone for follow up visit in response to provider referral for case management and/or care coordination services.  ? ?Consent to Services:  ?The patient was given information about Chronic Care Management services, agreed to services, and gave verbal consent prior to initiation of services.  Please see initial visit note for detailed documentation.  ? ?Patient agreed to services and verbal consent obtained.  ? ?Assessment: Review of patient past medical history, allergies, medications, health status, including review of consultants reports, laboratory and other test data, was performed as part of comprehensive evaluation and provision of chronic care management services.  ? ?SDOH (Social Determinants of Health) assessments and interventions performed:  Yes, no acute needs  ? ?CCM Care Plan ? ?Allergies  ?Allergen Reactions  ? Hydrocodone-Acetaminophen Nausea And Vomiting  ? Hydromorphone Nausea And Vomiting  ?  Other reaction(s): GI Upset (intolerance), Hypertension (intolerance) ?Raises blood pressure  ?Other reaction(s): GI Upset (intolerance), Hypertension (intolerance) ?Raises blood pressure to stroke level  ? Iodinated Contrast Media Other (See Comments)  ?  Shuts down kidneys ?Shuts kidney function down  ? Other Other (See Comments) and Anaphylaxis  ?  Spicy foods and seasonings ?Skin Prep "makes my skin peel off" ?Paper tape causes skin burns  ? Erythromycin Nausea And Vomiting  ? Latex Hives  ? Rinvoq [Upadacitinib] Other (See Comments)  ? Wilder Glade [Dapagliflozin] Other (See Comments)  ? Tape Other (See Comments)  ?  "Skin burns"  ? Mircette  [Desogestrel-Ethinyl Estradiol] Nausea And Vomiting and Rash  ? ? ?Outpatient Encounter Medications as of 04/16/2022  ?Medication Sig  ? aspirin 81 MG chewable tablet Chew 162 mg by mouth daily. Taking 2 tablets  ? atorvastatin (LIPITOR) 10 MG tablet Take 1 tablet (10 mg total) by mouth daily.  ? carvedilol (COREG) 25 MG tablet Take 1 tablet (25 mg total) by mouth 2 (two) times daily with a meal.  ? Continuous Blood Gluc Receiver (DEXCOM G6 RECEIVER) DEVI 1 Device by Does not apply route 3 (three) times daily before meals.  ? Continuous Blood Gluc Sensor (DEXCOM G6 SENSOR) MISC Inject 1 each into the skin 3 (three) times daily.  ? Continuous Blood Gluc Transmit (DEXCOM G6 TRANSMITTER) MISC 1 Device by Does not apply route 3 (three) times daily before meals.  ? diclofenac Sodium (VOLTAREN) 1 % GEL apply A SMALL AMOUNT TO involved joints UP TO TWICE DAILY  ? folic acid (FOLVITE) 1 MG tablet TAKE 1 TABLET BY MOUTH EVERY MORNING  ? furosemide (LASIX) 40 MG tablet Take by mouth.  ? Glucagon (GVOKE HYPOPEN 2-PACK) 1 MG/0.2ML SOAJ Inject 1 mg into the skin as needed.  ? hydrALAZINE (APRESOLINE) 25 MG tablet Take 0.5 tablets (12.5 mg total) by mouth 3 (three) times daily.  ? hydroxychloroquine (PLAQUENIL) 200 MG tablet Take 1 tablet (200 mg total) by mouth 2 (two) times daily.  ? insulin degludec (TRESIBA FLEXTOUCH) 100 UNIT/ML FlexTouch Pen Inject 10 Units into the skin daily.  ? insulin lispro (HUMALOG KWIKPEN) 100 UNIT/ML KwikPen Use according to sliding scale  ? Insulin Pen Needle (NOVOFINE PLUS PEN NEEDLE) 32G X 4 MM MISC Use with insulin pens dx code  e11.65  ? Ipratropium-Albuterol (COMBIVENT RESPIMAT) 20-100 MCG/ACT AERS respimat Inhale 1 puff into the lungs every 6 (six) hours.  ? ipratropium-albuterol (DUONEB) 0.5-2.5 (3) MG/3ML SOLN Take 3 mLs by nebulization every 6 (six) hours as needed.  ? Lactobacillus (ACIDOPHILUS) CAPS capsule Take 1 capsule by mouth daily.  ? Magnesium 400 MG TABS Take 1 tablet by mouth  daily.  ? metFORMIN (GLUCOPHAGE) 500 MG tablet Take by mouth.  ? metFORMIN (GLUCOPHAGE) 500 MG tablet Take 1 tablet (500 mg total) by mouth 2 (two) times daily.  ? methotrexate (RHEUMATREX) 2.5 MG tablet Take 20 mg by mouth every Friday.   ? metolazone (ZAROXOLYN) 2.5 MG tablet TAKE 1 TABLET BY MOUTH AS NEEDED FOR WEIGHT GAIN OF 5 POUNDS WITHIN 3 DAYS AS DIRECTED needs appt for further refills  ? Multiple Vitamin (MULTIVITAMIN WITH MINERALS) TABS Take 1 tablet by mouth every morning.   ? OLUMIANT tablet Take 2 mg by mouth daily.  ? ondansetron (ZOFRAN-ODT) 8 MG disintegrating tablet Take 1 tablet (8 mg total) by mouth every 8 (eight) hours as needed for nausea or vomiting.  ? PARoxetine (PAXIL) 10 MG tablet Take 1 tablet (10 mg total) by mouth every morning.  ? potassium chloride (KLOR-CON M) 10 MEQ tablet TAKE THREE TABLETS BY MOUTH TWICE DAILY  ? predniSONE (DELTASONE) 10 MG tablet Take 10 mg by mouth daily.  ? PROAIR HFA 108 (90 Base) MCG/ACT inhaler INHALE 2 PUFFS BY MOUTH EVERY DAY AS NEEDED FOR SHORTNESS OF BREATH  ? sacubitril-valsartan (ENTRESTO) 97-103 MG Take 1 tablet by mouth 2 (two) times daily.  ? spironolactone (ALDACTONE) 25 MG tablet TAKE ONE TABLET BY MOUTH EVERY MORNING and TAKE ONE TABLET BY MOUTH EVERY EVENING  ? torsemide (DEMADEX) 20 MG tablet Take 40 mg by mouth daily as needed.  ? traZODone (DESYREL) 50 MG tablet Take 50 mg by mouth at bedtime as needed.  ? Vitamin D, Ergocalciferol, (DRISDOL) 1.25 MG (50000 UNIT) CAPS capsule Take 1 capsule (50,000 Units total) by mouth 2 (two) times a week.  ? ?No facility-administered encounter medications on file as of 04/16/2022.  ? ? ?Patient Active Problem List  ? Diagnosis Date Noted  ? Anemia 03/21/2022  ? 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 History of COVID-19 04/12/2020  ? COVID-19 virus infection 03/07/2020  ? Lower abdominal pain 09/07/2019  ? Chronic systolic (congestive) heart failure ([place]) 01/28/2018  ? Cough  01/19/2018  ? Pulmonary infiltrates on CXR 01/19/2018  ? Migraines 10/08/2017  ? Sleep apnea with use of continuous positive airway pressure (CPAP) 09/24/2017  ? Rheumatoid arthritis ([place]) 10/09/2016  ? Asthma 10/09/2016  ? Chronic pain 10/09/2016  ? Heart failure ([place]) 10/09/2016  ? Congestive heart failure ([place])   ? Lupus (systemic lupus erythematosus) ([place])   ? Diabetes mellitus with complication ([place])   ? Anxiety state   ? GERD (gastroesophageal reflux disease) 12/28/2015  ? Essential hypertension 12/26/2015  ? Type 2 diabetes mellitus ([place]) 12/26/2015  ? Chronic systolic heart failure ([place]) 08/18/2014  ? Cardiomyopathy, dilated ([place]) 01/27/2014  ? Shortness of breath 01/26/2014  ? SLE (systemic lupus erythematosus) ([place]) 01/26/2014  ? ? ?Conditions to be addressed/monitored: CHF, DM II, HTN, SLE, RA, Asthma  ? ?Care Plan : RN Care Manager Plan of Care  ?Updates made by [name], RN since 04/16/2022 12:00 AM  ?  ? ?Problem: Chronic disease education and Chronic Care Coordination needs for CHF, DM II, HTN, SLE, RA, Asthma   ?Priority:  High  ?  ? ?Long-Range Goal: Assist with Chronic disease education and Care Coordination needs for CHF, DM II, HTN, SLE, RA, Asthma   ?Start Date: 10/16/2021  ?Expected End Date: 10/16/2022  ?Recent Progress: On track  ?Priority: High  ?Note:   ?Current Barriers:  ?Knowledge Deficits related to plan of care for management of CHF, DM II, HTN, SLE, RA, Asthma  ?Chronic Disease Management support and education needs related to CHF, DM II, HTN, SLE, RA, Asthma  ? ?RNCM Clinical Goal(s):  ?Patient will demonstrate Ongoing health management independence   ?continue to work with RN Care Manager to address care management and care coordination needs related to  CHF, DM II, HTN, SLE, RA, Asthma  ?will demonstrate ongoing self health care management ability    through collaboration with RN Care manager, provider, and care team.  ? ?Interventions: ?1:1 collaboration with primary care  provider regarding development and update of comprehensive plan of care as evidenced by provider attestation and co-signature ?Inter-disciplinary care team collaboration (see longitudinal plan of care) ?Evaluat

## 2022-04-21 DIAGNOSIS — I5022 Chronic systolic (congestive) heart failure: Secondary | ICD-10-CM

## 2022-04-21 DIAGNOSIS — E1165 Type 2 diabetes mellitus with hyperglycemia: Secondary | ICD-10-CM

## 2022-04-21 DIAGNOSIS — M069 Rheumatoid arthritis, unspecified: Secondary | ICD-10-CM

## 2022-04-21 DIAGNOSIS — J4520 Mild intermittent asthma, uncomplicated: Secondary | ICD-10-CM

## 2022-04-21 DIAGNOSIS — I10 Essential (primary) hypertension: Secondary | ICD-10-CM

## 2022-05-10 ENCOUNTER — Other Ambulatory Visit (HOSPITAL_COMMUNITY): Payer: Self-pay | Admitting: Internal Medicine

## 2022-05-11 DIAGNOSIS — E1165 Type 2 diabetes mellitus with hyperglycemia: Secondary | ICD-10-CM | POA: Diagnosis not present

## 2022-05-13 ENCOUNTER — Telehealth: Payer: Self-pay

## 2022-05-13 ENCOUNTER — Other Ambulatory Visit (HOSPITAL_COMMUNITY): Payer: Self-pay | Admitting: *Deleted

## 2022-05-13 NOTE — Chronic Care Management (AMB) (Addendum)
Chronic Care Management Pharmacy Assistant   Name: Christine Mcdowell  MRN: [id number] DOB: 08-Aug-1971  Reason for Encounter: Medication Review/ Medication coordination  Recent office visits:  04-16-2022 Little, [name], RN (CCM)  Recent consult visits:  None  Hospital visits:  Medication Reconciliation was completed by comparing discharge summary, patient's EMR and Pharmacy list, and upon discussion with patient.   Admitted to the hospital on 03-05-2022 due to  Chest pain. Discharge date was 03-05-2022. Discharged from [facility]?Medications Started at [facility] Discharge:?? None   Medication Changes at Hospital Discharge: None   Medications Discontinued at Hospital Discharge: None   Medications that remain the same after Hospital Discharge:??  -All other medications will remain the same.     Hospital visits:  Medication Reconciliation was completed by comparing discharge summary, patient's EMR and Pharmacy list, and upon discussion with patient.   Admitted to the hospital on 02-25-2022 due to Shortness of breath. Discharge date was 02-25-2022. Discharged from [place]?Medications Started at [facility] Discharge:?? None   Medication Changes at Hospital Discharge: None   Medications Discontinued at Hospital Discharge: None   Medications that remain the same after Hospital Discharge:??  -All other medications will remain the same.    Medications: Outpatient Encounter Medications as of 05/13/2022  Medication Sig   aspirin 81 MG chewable tablet Chew 162 mg by mouth daily. Taking 2 tablets   atorvastatin (LIPITOR) 10 MG tablet Take 1 tablet (10 mg total) by mouth daily.   carvedilol (COREG) 25 MG tablet Take 1 tablet (25 mg total) by mouth 2 (two) times daily with a meal.   Continuous Blood Gluc Receiver (DEXCOM G6 RECEIVER) DEVI 1 Device by Does not apply route 3 (three) times daily before meals.   Continuous  Blood Gluc Sensor (DEXCOM G6 SENSOR) MISC Inject 1 each into the skin 3 (three) times daily.   Continuous Blood Gluc Transmit (DEXCOM G6 TRANSMITTER) MISC 1 Device by Does not apply route 3 (three) times daily before meals.   diclofenac Sodium (VOLTAREN) 1 % GEL apply A SMALL AMOUNT TO involved joints UP TO TWICE DAILY   folic acid (FOLVITE) 1 MG tablet TAKE 1 TABLET BY MOUTH EVERY MORNING   furosemide (LASIX) 40 MG tablet Take by mouth.   Glucagon (GVOKE HYPOPEN 2-PACK) 1 MG/0.2ML SOAJ Inject 1 mg into the skin as needed.   hydrALAZINE (APRESOLINE) 25 MG tablet Take 0.5 tablets (12.5 mg total) by mouth 3 (three) times daily.   hydroxychloroquine (PLAQUENIL) 200 MG tablet Take 1 tablet (200 mg total) by mouth 2 (two) times daily.   insulin degludec (TRESIBA FLEXTOUCH) 100 UNIT/ML FlexTouch Pen Inject 10 Units into the skin daily.   insulin lispro (HUMALOG KWIKPEN) 100 UNIT/ML KwikPen Use according to sliding scale   Insulin Pen Needle (NOVOFINE PLUS PEN NEEDLE) 32G X 4 MM MISC Use with insulin pens dx code e11.65   Ipratropium-Albuterol (COMBIVENT RESPIMAT) 20-100 MCG/ACT AERS respimat Inhale 1 puff into the lungs every 6 (six) hours.   ipratropium-albuterol (DUONEB) 0.5-2.5 (3) MG/3ML SOLN Take 3 mLs by nebulization every 6 (six) hours as needed.   Lactobacillus (ACIDOPHILUS) CAPS capsule Take 1 capsule by mouth daily.   Magnesium 400 MG TABS Take 1 tablet by mouth daily.   metFORMIN (GLUCOPHAGE) 500 MG tablet Take by mouth.   metFORMIN (GLUCOPHAGE) 500 MG tablet Take 1 tablet (500 mg total) by mouth 2 (two) times daily.  methotrexate (RHEUMATREX) 2.5 MG tablet Take 20 mg by mouth every Friday.    metolazone (ZAROXOLYN) 2.5 MG tablet TAKE 1 TABLET BY MOUTH AS NEEDED FOR WEIGHT GAIN OF 5 POUNDS WITHIN 3 DAYS AS DIRECTED needs appt for further refills   Multiple Vitamin (MULTIVITAMIN WITH MINERALS) TABS Take 1 tablet by mouth every morning.    OLUMIANT tablet Take 2 mg by mouth daily.    ondansetron (ZOFRAN-ODT) 8 MG disintegrating tablet Take 1 tablet (8 mg total) by mouth every 8 (eight) hours as needed for nausea or vomiting.   PARoxetine (PAXIL) 10 MG tablet Take 1 tablet (10 mg total) by mouth every morning.   potassium chloride (KLOR-CON M) 10 MEQ tablet TAKE THREE TABLETS BY MOUTH TWICE DAILY   predniSONE (DELTASONE) 10 MG tablet Take 10 mg by mouth daily.   PROAIR HFA 108 (90 Base) MCG/ACT inhaler INHALE 2 PUFFS BY MOUTH EVERY DAY AS NEEDED FOR SHORTNESS OF BREATH   sacubitril-valsartan (ENTRESTO) 97-103 MG Take 1 tablet by mouth 2 (two) times daily.   spironolactone (ALDACTONE) 25 MG tablet TAKE ONE TABLET BY MOUTH EVERY MORNING and TAKE ONE TABLET BY MOUTH EVERY EVENING   torsemide (DEMADEX) 20 MG tablet Take 40 mg by mouth daily as needed.   traZODone (DESYREL) 50 MG tablet Take 50 mg by mouth at bedtime as needed.   Vitamin D, Ergocalciferol, (DRISDOL) 1.25 MG (50000 UNIT) CAPS capsule Take 1 capsule (50,000 Units total) by mouth 2 (two) times a week.   No facility-administered encounter medications on file as of 05/13/2022.   Reviewed chart for medication changes ahead of medication coordination call.   BP Readings from Last 3 Encounters:  03/21/22 (!) 128/91  03/08/22 102/68  03/05/22 139/86    Lab Results  Component Value Date   HGBA1C 7.0 (H) 02/26/2022     Patient obtains medications through Vials  30 Days   Last adherence delivery included:  Insulin Lispro (Humalog)- use according to sliding scale Entresto 97 mg/103 mg- one tablet twice a day TruePlus Pen Needles 32 G-use as directed Vitamin D 50,000 units- one capsules two times a week, Tuesday and Friday Atorvastatin 10 mg- one tablet daily Folic acid 1 mg- daily Symbicort 80/4.5 mcg- 2 puffs twice daily in morning and at bedtime Tresiba 100 u/ml- Inject 10 units into skin daily Paroxetine 10 mg- 1 tablet daily Spironolactone 25 mg- one tablet twice a day Methotrexate 2.5 mg- 8 tabs every  Friday Trazodone 50 mg- 1 tablet at bedtime as needed for sleep Carvedilol 25 mg- One tablet twice a day Metformin 500 mg- one tablet twice a day Potassium CL ER 10 Meq- 3 tabs twice daily Hydralazine 12.5 mg 3 times daily Torsemide 20 mg twice daily  Patient declined (meds) last month:  None  Patient is due for next adherence delivery on: 05-23-2022  Called patient and reviewed medications and coordinated delivery.  This delivery to include: Insulin Lispro (Humalog)- use according to sliding scale Entresto 97 mg/103 mg- one tablet twice a day TruePlus Pen Needles 32 G-use as directed Vitamin D 50,000 units- one capsules two times a week, Tuesday and Friday Atorvastatin 10 mg- one tablet daily Folic acid 1 mg- daily Tresiba 100 u/ml- Inject 10 units into skin daily Spironolactone 25 mg- one tablet twice a day Methotrexate 2.5 mg- 8 tabs every Friday Trazodone 50 mg- 1 tablet at bedtime as needed for sleep Carvedilol 25 mg- One tablet twice a day Metformin 500 mg- one tablet twice a  day Potassium CL ER 10 Meq- 3 tabs twice daily Hydralazine 12.5 mg 3 times daily Torsemide 20 mg twice daily  No acute/short fill needed  Patient declined the following medications: Paxil 10 mg daily- Patient stated to hold medication per therapist. Symbicort- Patient needs an appointment with Dr. Elsworth Soho to fill  Patient needs refills for: Sent Symbicort- Patient needs an appointment with Dr. Elsworth Soho to fill Spironolactone- Dr. Haroldine Laws (Left VM) Paroxetine  Confirmed delivery date of 05-23-2022 advised patient that pharmacy will contact them the morning of delivery.  05-21-2022: Informed patient that Dr. Elsworth Soho denied refill request for symbicort due to appointment needed. Patient will schedule appointment and [facility].  [place] Clinical Pharmacist Assistant [phone]

## 2022-05-17 ENCOUNTER — Other Ambulatory Visit: Payer: Self-pay

## 2022-05-17 ENCOUNTER — Other Ambulatory Visit: Payer: Self-pay | Admitting: Pulmonary Disease

## 2022-05-17 DIAGNOSIS — J209 Acute bronchitis, unspecified: Secondary | ICD-10-CM

## 2022-05-17 DIAGNOSIS — R232 Flushing: Secondary | ICD-10-CM

## 2022-05-17 MED ORDER — PAROXETINE HCL 10 MG PO TABS: 10.0000 mg | ORAL_TABLET | Freq: Every morning | ORAL | 2 refills | Status: DC

## 2022-05-29 ENCOUNTER — Ambulatory Visit (INDEPENDENT_AMBULATORY_CARE_PROVIDER_SITE_OTHER): Payer: Medicare Other

## 2022-05-29 DIAGNOSIS — R000 Tachycardia, unspecified: Secondary | ICD-10-CM

## 2022-05-30 ENCOUNTER — Telehealth: Payer: Self-pay

## 2022-05-30 NOTE — Chronic Care Management (AMB) (Signed)
Chronic Care Management Pharmacy Assistant   Name: Christine Mcdowell  MRN: [id number] DOB: [birth year]  Reason for Encounter: Disease State/ Diabetes  Recent office visits:  None  Recent consult visits:  None  Hospital visits:  Medication Reconciliation was completed by comparing discharge summary, patient's EMR and Pharmacy list, and upon discussion with patient.   Admitted to the hospital on 03-05-2022 due to  Chest pain. Discharge date was 03-05-2022. Discharged from [place]?Medications Started at [facility] Discharge:?? None   Medication Changes at Hospital Discharge: None   Medications Discontinued at Hospital Discharge: None   Medications that remain the same after Hospital Discharge:??  -All other medications will remain the same.     Hospital visits:  Medication Reconciliation was completed by comparing discharge summary, patient's EMR and Pharmacy list, and upon discussion with patient.   Admitted to the hospital on 02-25-2022 due to Shortness of breath. Discharge date was 02-25-2022. Discharged from [place]?Medications Started at [facility] Discharge:?? None   Medication Changes at Hospital Discharge: None   Medications Discontinued at Hospital Discharge: None   Medications that remain the same after Hospital Discharge:??  -All other medications will remain the same.    Medications: Outpatient Encounter Medications as of 05/30/2022  Medication Sig   aspirin 81 MG chewable tablet Chew 162 mg by mouth daily. Taking 2 tablets   atorvastatin (LIPITOR) 10 MG tablet Take 1 tablet (10 mg total) by mouth daily.   carvedilol (COREG) 25 MG tablet Take 1 tablet (25 mg total) by mouth 2 (two) times daily with a meal.   Continuous Blood Gluc Receiver (DEXCOM G6 RECEIVER) DEVI 1 Device by Does not apply route 3 (three) times daily before meals.   Continuous Blood Gluc Sensor (DEXCOM G6 SENSOR) MISC Inject 1  each into the skin 3 (three) times daily.   Continuous Blood Gluc Transmit (DEXCOM G6 TRANSMITTER) MISC 1 Device by Does not apply route 3 (three) times daily before meals.   diclofenac Sodium (VOLTAREN) 1 % GEL apply A SMALL AMOUNT TO involved joints UP TO TWICE DAILY   folic acid (FOLVITE) 1 MG tablet TAKE 1 TABLET BY MOUTH EVERY MORNING   furosemide (LASIX) 40 MG tablet Take by mouth.   Glucagon (GVOKE HYPOPEN 2-PACK) 1 MG/0.2ML SOAJ Inject 1 mg into the skin as needed.   hydrALAZINE (APRESOLINE) 25 MG tablet Take 0.5 tablets (12.5 mg total) by mouth 3 (three) times daily.   hydroxychloroquine (PLAQUENIL) 200 MG tablet Take 1 tablet (200 mg total) by mouth 2 (two) times daily.   insulin degludec (TRESIBA FLEXTOUCH) 100 UNIT/ML FlexTouch Pen Inject 10 Units into the skin daily.   insulin lispro (HUMALOG KWIKPEN) 100 UNIT/ML KwikPen Use according to sliding scale   Insulin Pen Needle (NOVOFINE PLUS PEN NEEDLE) 32G X 4 MM MISC Use with insulin pens dx code e11.65   Ipratropium-Albuterol (COMBIVENT RESPIMAT) 20-100 MCG/ACT AERS respimat Inhale 1 puff into the lungs every 6 (six) hours.   ipratropium-albuterol (DUONEB) 0.5-2.5 (3) MG/3ML SOLN Take 3 mLs by nebulization every 6 (six) hours as needed.   Lactobacillus (ACIDOPHILUS) CAPS capsule Take 1 capsule by mouth daily.   Magnesium 400 MG TABS Take 1 tablet by mouth daily.   metFORMIN (GLUCOPHAGE) 500 MG tablet Take by mouth.   metFORMIN (GLUCOPHAGE) 500 MG tablet Take 1 tablet (500 mg total) by mouth 2 (two) times daily.   methotrexate (RHEUMATREX) 2.5 MG tablet  Take 20 mg by mouth every Friday.    metolazone (ZAROXOLYN) 2.5 MG tablet TAKE 1 TABLET BY MOUTH AS NEEDED FOR WEIGHT GAIN OF 5 POUNDS WITHIN 3 DAYS AS DIRECTED needs appt for further refills   Multiple Vitamin (MULTIVITAMIN WITH MINERALS) TABS Take 1 tablet by mouth every morning.    OLUMIANT tablet Take 2 mg by mouth daily.   ondansetron (ZOFRAN-ODT) 8 MG disintegrating tablet Take 1  tablet (8 mg total) by mouth every 8 (eight) hours as needed for nausea or vomiting.   PARoxetine (PAXIL) 10 MG tablet Take 1 tablet (10 mg total) by mouth every morning.   potassium chloride (KLOR-CON M) 10 MEQ tablet TAKE THREE TABLETS BY MOUTH TWICE DAILY   predniSONE (DELTASONE) 10 MG tablet Take 10 mg by mouth daily.   PROAIR HFA 108 (90 Base) MCG/ACT inhaler INHALE 2 PUFFS BY MOUTH EVERY DAY AS NEEDED FOR SHORTNESS OF BREATH   sacubitril-valsartan (ENTRESTO) 97-103 MG Take 1 tablet by mouth 2 (two) times daily.   spironolactone (ALDACTONE) 25 MG tablet TAKE ONE TABLET BY MOUTH EVERY MORNING and TAKE ONE TABLET BY MOUTH EVERY EVENING   torsemide (DEMADEX) 20 MG tablet Take 40 mg by mouth daily as needed.   traZODone (DESYREL) 50 MG tablet Take 50 mg by mouth at bedtime as needed.   Vitamin D, Ergocalciferol, (DRISDOL) 1.25 MG (50000 UNIT) CAPS capsule Take 1 capsule (50,000 Units total) by mouth 2 (two) times a week.   No facility-administered encounter medications on file as of 05/30/2022.  Recent Relevant Labs: Lab Results  Component Value Date/Time   HGBA1C 7.0 (H) 02/26/2022 03:42 PM   HGBA1C 6.4 (H) 10/11/2021 12:55 PM   MICROALBUR 10 08/24/2019 06:06 PM    Kidney Function Lab Results  Component Value Date/Time   CREATININE 1.01 (H) 03/21/2022 12:52 PM   CREATININE 0.95 03/08/2022 02:12 PM   CREATININE 1.07 (H) 03/05/2022 12:30 AM   CREATININE 0.89 10/04/2016 09:36 AM   CREATININE 0.85 03/27/2016 08:47 AM   GFR 86.64 02/28/2015 10:05 AM   GFRNONAA >60 03/21/2022 12:52 PM   GFRAA 74 08/30/2020 05:29 PM    Current antihyperglycemic regimen:  Metformin 500 mg daily Gvoke inject 1 mg PRN Tresiba 10 units daily Humalog sliding scale  What recent interventions/DTPs have been made to improve glycemic control:  None  Have there been any recent hospitalizations or ED visits since last visit with CPP? No  Patient reports hypoglycemic symptoms, including Sweaty,  Nervous/irritable, and Vision changes  Patient reports hyperglycemic symptoms, including polyuria  How often are you checking your blood sugar? Patient stated she has dexcom  What are your blood sugars ranging?  Fasting: 90-100 Before meals: None After meals: None Bedtime: 200-280  During the week, how often does your blood glucose drop below 70? Patient stated sometimes  Are you checking your feet daily/regularly? Daily  Adherence Review: Is the patient currently on a STATIN medication? Yes Is the patient currently on ACE/ARB medication? Yes Does the patient have >5 day gap between last estimated fill dates? No  Care Gaps: Covid booster overdue Yearly foot exam overdue Yearly ophthalmology exam overdue Shingrix overdue AWV 10-03-2022  Star Rating Drugs: Entresto 97-103 mg- Last filled 05-17-2022 30 [facility] Metformin 500 mg- Last filled 05-17-2022 30 [facility] Atorvastatin 10 mg- Last filled 05-17-2022 30 [facility]  [place] Pharmacist Assistant [phone]

## 2022-05-31 LAB — CUP PACEART REMOTE DEVICE CHECK
Battery Remaining Longevity: 138 mo
Battery Remaining Percentage: 98 %
Brady Statistic RV Percent Paced: 0 %
Date Time Interrogation Session: 20230607045100
HighPow Impedance: 79 Ohm
Implantable Lead Implant Date: 20190206
Implantable Lead Location: 753860
Implantable Lead Model: 292
Implantable Lead Serial Number: 438194
Implantable Pulse Generator Implant Date: 20190206
Lead Channel Impedance Value: 611 Ohm
Lead Channel Setting Pacing Amplitude: 2.5 V
Lead Channel Setting Pacing Pulse Width: 0.4 ms
Lead Channel Setting Sensing Sensitivity: 0.5 mV
Pulse Gen Serial Number: 243572

## 2022-06-07 NOTE — Progress Notes (Signed)
Remote ICD transmission.   

## 2022-06-10 ENCOUNTER — Other Ambulatory Visit (HOSPITAL_COMMUNITY): Payer: Self-pay | Admitting: Family Medicine

## 2022-06-11 ENCOUNTER — Other Ambulatory Visit (HOSPITAL_COMMUNITY): Payer: Self-pay | Admitting: *Deleted

## 2022-06-11 ENCOUNTER — Telehealth: Payer: Self-pay

## 2022-06-11 DIAGNOSIS — E1165 Type 2 diabetes mellitus with hyperglycemia: Secondary | ICD-10-CM | POA: Diagnosis not present

## 2022-06-11 NOTE — Chronic Care Management (AMB) (Signed)
Chronic Care Management Pharmacy Assistant   Name: Christine Mcdowell  MRN: [id number] DOB: 10-Feb-1971   Reason for Encounter: Medication Review/ Medication coordination  Recent office visits:  None  Recent consult visits:  None  Hospital visits:  Medication Reconciliation was completed by comparing discharge summary, patient's EMR and Pharmacy list, and upon discussion with patient.   Admitted to the hospital on 03-05-2022 due to  Chest pain. Discharge date was 03-05-2022. Discharged from [place]?Medications Started at [facility] Discharge:?? None   Medication Changes at Hospital Discharge: None   Medications Discontinued at Hospital Discharge: None   Medications that remain the same after Hospital Discharge:??  -All other medications will remain the same.     Hospital visits:  Medication Reconciliation was completed by comparing discharge summary, patient's EMR and Pharmacy list, and upon discussion with patient.   Admitted to the hospital on 02-25-2022 due to Shortness of breath. Discharge date was 02-25-2022. Discharged from [place]?Medications Started at [facility] Discharge:?? None   Medication Changes at Hospital Discharge: None   Medications Discontinued at Hospital Discharge: None   Medications that remain the same after Hospital Discharge:??  -All other medications will remain the same.    Medications: Outpatient Encounter Medications as of 06/11/2022  Medication Sig   aspirin 81 MG chewable tablet Chew 162 mg by mouth daily. Taking 2 tablets   atorvastatin (LIPITOR) 10 MG tablet Take 1 tablet (10 mg total) by mouth daily.   carvedilol (COREG) 25 MG tablet Take 1 tablet (25 mg total) by mouth 2 (two) times daily with a meal.   Continuous Blood Gluc Receiver (DEXCOM G6 RECEIVER) DEVI 1 Device by Does not apply route 3 (three) times daily before meals.   Continuous Blood Gluc Sensor (DEXCOM G6  SENSOR) MISC Inject 1 each into the skin 3 (three) times daily.   Continuous Blood Gluc Transmit (DEXCOM G6 TRANSMITTER) MISC 1 Device by Does not apply route 3 (three) times daily before meals.   diclofenac Sodium (VOLTAREN) 1 % GEL apply A SMALL AMOUNT TO involved joints UP TO TWICE DAILY   folic acid (FOLVITE) 1 MG tablet TAKE 1 TABLET BY MOUTH EVERY MORNING   furosemide (LASIX) 40 MG tablet Take by mouth.   Glucagon (GVOKE HYPOPEN 2-PACK) 1 MG/0.2ML SOAJ Inject 1 mg into the skin as needed.   hydrALAZINE (APRESOLINE) 25 MG tablet Take 0.5 tablets (12.5 mg total) by mouth 3 (three) times daily.   hydroxychloroquine (PLAQUENIL) 200 MG tablet Take 1 tablet (200 mg total) by mouth 2 (two) times daily.   insulin degludec (TRESIBA FLEXTOUCH) 100 UNIT/ML FlexTouch Pen Inject 10 Units into the skin daily.   insulin lispro (HUMALOG KWIKPEN) 100 UNIT/ML KwikPen Use according to sliding scale   Insulin Pen Needle (NOVOFINE PLUS PEN NEEDLE) 32G X 4 MM MISC Use with insulin pens dx code e11.65   Ipratropium-Albuterol (COMBIVENT RESPIMAT) 20-100 MCG/ACT AERS respimat Inhale 1 puff into the lungs every 6 (six) hours.   ipratropium-albuterol (DUONEB) 0.5-2.5 (3) MG/3ML SOLN Take 3 mLs by nebulization every 6 (six) hours as needed.   Lactobacillus (ACIDOPHILUS) CAPS capsule Take 1 capsule by mouth daily.   Magnesium 400 MG TABS Take 1 tablet by mouth daily.   metFORMIN (GLUCOPHAGE) 500 MG tablet Take by mouth.   metFORMIN (GLUCOPHAGE) 500 MG tablet Take 1 tablet (500 mg total) by mouth 2 (two) times daily.   methotrexate (RHEUMATREX) 2.5  MG tablet Take 20 mg by mouth every Friday.    metolazone (ZAROXOLYN) 2.5 MG tablet TAKE 1 TABLET BY MOUTH AS NEEDED FOR WEIGHT GAIN OF 5 POUNDS WITHIN 3 DAYS AS DIRECTED needs appt for further refills   Multiple Vitamin (MULTIVITAMIN WITH MINERALS) TABS Take 1 tablet by mouth every morning.    OLUMIANT tablet Take 2 mg by mouth daily.   ondansetron (ZOFRAN-ODT) 8 MG  disintegrating tablet Take 1 tablet (8 mg total) by mouth every 8 (eight) hours as needed for nausea or vomiting.   PARoxetine (PAXIL) 10 MG tablet Take 1 tablet (10 mg total) by mouth every morning.   potassium chloride (KLOR-CON M) 10 MEQ tablet TAKE THREE TABLETS BY MOUTH TWICE DAILY   predniSONE (DELTASONE) 10 MG tablet Take 10 mg by mouth daily.   PROAIR HFA 108 (90 Base) MCG/ACT inhaler INHALE 2 PUFFS BY MOUTH EVERY DAY AS NEEDED FOR SHORTNESS OF BREATH   sacubitril-valsartan (ENTRESTO) 97-103 MG Take 1 tablet by mouth 2 (two) times daily.   spironolactone (ALDACTONE) 25 MG tablet TAKE ONE TABLET BY MOUTH EVERY MORNING and TAKE ONE TABLET BY MOUTH EVERY EVENING   torsemide (DEMADEX) 20 MG tablet TAKE TWO TABLETS BY MOUTH ONCE DAILY   traZODone (DESYREL) 50 MG tablet Take 50 mg by mouth at bedtime as needed.   Vitamin D, Ergocalciferol, (DRISDOL) 1.25 MG (50000 UNIT) CAPS capsule Take 1 capsule (50,000 Units total) by mouth 2 (two) times a week.   No facility-administered encounter medications on file as of 06/11/2022.   Reviewed chart for medication changes ahead of medication coordination call.  No medication changes indicated OR if recent visit, treatment plan here.  BP Readings from Last 3 Encounters:  03/21/22 (!) 128/91  03/08/22 102/68  03/05/22 139/86    Lab Results  Component Value Date   HGBA1C 7.0 (H) 02/26/2022     Patient obtains medications through Vials  30 Days   Last adherence delivery included:  Insulin Lispro (Humalog)- use according to sliding scale Entresto 97 mg/103 mg- one tablet twice a day TruePlus Pen Needles 32 G-use as directed Vitamin D 50,000 units- one capsules two times a week, Tuesday and Friday Atorvastatin 10 mg- one tablet daily Folic acid 1 mg- daily Tresiba 100 u/ml- Inject 10 units into skin daily Spironolactone 25 mg- one tablet twice a day Methotrexate 2.5 mg- 8 tabs every Friday Trazodone 50 mg- 1 tablet at bedtime as needed for  sleep Carvedilol 25 mg- One tablet twice a day Metformin 500 mg- one tablet twice a day Potassium CL ER 10 Meq- 3 tabs twice daily Torsemide 20 mg twice daily  Patient declined (meds) last month: Paxil 10 mg daily- Patient stated to hold medication per therapist. Symbicort- Patient needs an appointment with Dr. Elsworth Soho to fill  Patient is due for next adherence delivery on: 06-21-2022  Called patient and reviewed medications and coordinated delivery.  This delivery to include: Insulin Lispro (Humalog)- use according to sliding scale Entresto 97 mg/103 mg- one tablet twice a day TruePlus Pen Needles 32 G-use as directed Vitamin D 50,000 units- one capsules two times a week, Tuesday and Friday Atorvastatin 10 mg- one tablet daily Folic acid 1 mg- daily Tresiba 100 u/ml- Inject 10 units into skin daily Spironolactone 25 mg- one tablet twice a day Methotrexate 2.5 mg- 8 tabs every Friday Trazodone 50 mg- 1 tablet at bedtime as needed for sleep Carvedilol 25 mg- One tablet twice a day Metformin 500 mg- one tablet  twice a day Potassium CL ER 10 Meq- 3 tabs twice daily Torsemide 20 mg twice daily  No short/acute fill needed   Patient declined the following medications: None  Patient needs refills for: Request sent Methotrexate- Dr. Gale Journey [phone]  Trazodone- Dr Rogers Blocker [phone]  Torsemide- Janett Billow [place] [phone]- Left VM  Confirmed delivery date of 06-21-2022 advised patient that pharmacy will contact them the morning of delivery.  [place] Pharmacist Assistant [phone]

## 2022-06-14 DIAGNOSIS — E538 Deficiency of other specified B group vitamins: Secondary | ICD-10-CM | POA: Diagnosis not present

## 2022-06-14 DIAGNOSIS — M329 Systemic lupus erythematosus, unspecified: Secondary | ICD-10-CM | POA: Diagnosis not present

## 2022-06-14 DIAGNOSIS — Z79899 Other long term (current) drug therapy: Secondary | ICD-10-CM | POA: Diagnosis not present

## 2022-06-20 ENCOUNTER — Other Ambulatory Visit: Payer: Self-pay

## 2022-06-20 ENCOUNTER — Inpatient Hospital Stay: Payer: Medicare Other | Attending: Internal Medicine

## 2022-06-20 ENCOUNTER — Inpatient Hospital Stay (HOSPITAL_BASED_OUTPATIENT_CLINIC_OR_DEPARTMENT_OTHER): Payer: Medicare Other | Admitting: Internal Medicine

## 2022-06-20 VITALS — BP 151/89 | Temp 98.3°F | Resp 16 | Ht 67.0 in | Wt 185.5 lb

## 2022-06-20 DIAGNOSIS — D509 Iron deficiency anemia, unspecified: Secondary | ICD-10-CM | POA: Diagnosis not present

## 2022-06-20 DIAGNOSIS — D500 Iron deficiency anemia secondary to blood loss (chronic): Secondary | ICD-10-CM | POA: Diagnosis not present

## 2022-06-20 DIAGNOSIS — D573 Sickle-cell trait: Secondary | ICD-10-CM | POA: Insufficient documentation

## 2022-06-20 DIAGNOSIS — R5383 Other fatigue: Secondary | ICD-10-CM | POA: Insufficient documentation

## 2022-06-20 DIAGNOSIS — Z79899 Other long term (current) drug therapy: Secondary | ICD-10-CM | POA: Diagnosis not present

## 2022-06-20 DIAGNOSIS — D649 Anemia, unspecified: Secondary | ICD-10-CM

## 2022-06-20 LAB — CBC WITH DIFFERENTIAL (CANCER CENTER ONLY)
Abs Immature Granulocytes: 0.02 10*3/uL (ref 0.00–0.07)
Basophils Absolute: 0 10*3/uL (ref 0.0–0.1)
Basophils Relative: 0 %
Eosinophils Absolute: 0 10*3/uL (ref 0.0–0.5)
Eosinophils Relative: 0 %
HCT: 31.2 % — ABNORMAL LOW (ref 36.0–46.0)
Hemoglobin: 10.1 g/dL — ABNORMAL LOW (ref 12.0–15.0)
Immature Granulocytes: 0 %
Lymphocytes Relative: 21 %
Lymphs Abs: 1.1 10*3/uL (ref 0.7–4.0)
MCH: 28 pg (ref 26.0–34.0)
MCHC: 32.4 g/dL (ref 30.0–36.0)
MCV: 86.4 fL (ref 80.0–100.0)
Monocytes Absolute: 0.4 10*3/uL (ref 0.1–1.0)
Monocytes Relative: 7 %
Neutro Abs: 3.7 10*3/uL (ref 1.7–7.7)
Neutrophils Relative %: 72 %
Platelet Count: 272 10*3/uL (ref 150–400)
RBC: 3.61 MIL/uL — ABNORMAL LOW (ref 3.87–5.11)
RDW: 14.6 % (ref 11.5–15.5)
WBC Count: 5.2 10*3/uL (ref 4.0–10.5)
nRBC: 0 % (ref 0.0–0.2)

## 2022-06-20 LAB — IRON AND IRON BINDING CAPACITY (CC-WL,HP ONLY)
Iron: 103 ug/dL (ref 28–170)
Saturation Ratios: 31 % (ref 10.4–31.8)
TIBC: 332 ug/dL (ref 250–450)
UIBC: 229 ug/dL (ref 148–442)

## 2022-06-20 LAB — SAMPLE TO BLOOD BANK

## 2022-06-20 LAB — FERRITIN: Ferritin: 37 ng/mL (ref 11–307)

## 2022-06-20 NOTE — Progress Notes (Signed)
[place] [phone]   [phone]  OFFICE PROGRESS NOTE  [name], FNP [address] [place] [address]  DIAGNOSIS: Iron deficiency anemia  PRIOR THERAPY: None  CURRENT THERAPY: Multivitamins 1 tablet p.o. daily in addition to folic acid 1 mg p.o. daily.  INTERVAL HISTORY: Christine Mcdowell 51 y.o. female returns to the clinic today for follow-up visit.  The patient is feeling fine today with no concerning complaints except for mild fatigue.  She denied having any dizzy spells.  She has no nausea, vomiting, diarrhea or constipation.  She has no bleeding, bruises or ecchymosis.  She has no chest pain, shortness of breath, cough or hemoptysis.  She denied having any recent weight loss or night sweats.  She is here today for evaluation and repeat blood work.  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   Discoid lupus    Fibromyalgia    History of blood transfusion "several"   "related to anemia; had some w/hysterectomy also"   Hx of cardiovascular stress test    ETT-Myoview (9/15):  No ischemia, EF 52%; NORMAL   Hx of echocardiogram    Echo (9/15):  EF 50-55%, ant HK, Gr 1 DD, mild MR, mild LAE, no effusion   Hypertension    Iron deficiency anemia    h/o iron transfusions   Lupus (systemic lupus erythematosus) ([place])    Migraine    "a few/year" (07/03/2016)   Pneumonia 12/2015   RA (rheumatoid arthritis) ([place])    "all over" (07/03/2016)   Sickle cell trait ([place])    Stroke ([place]) 2014 X 1; 2015 X 2; 2016 X 1;    "right side of face more relaxed than the other; rare speech hesitation" (07/03/2016)   Vaginal Pap smear, abnormal    ASCUS; HPV     ALLERGIES:  is allergic to hydrocodone-acetaminophen, hydromorphone, iodinated contrast media, other, erythromycin, latex, rinvoq [upadacitinib], farxiga [dapagliflozin], tape, and mircette [desogestrel-ethinyl estradiol].  MEDICATIONS:  Current Outpatient Medications  Medication Sig Dispense Refill   aspirin 81 MG chewable tablet Chew 162 mg by mouth daily. Taking 2 tablets     atorvastatin (LIPITOR) 10 MG tablet Take 1 tablet (10 mg total) by mouth daily. 90 tablet 2   carvedilol (COREG) 25 MG tablet Take 1 tablet (25 mg total) by mouth 2 (two) times daily with a meal. 60 tablet 6   Continuous Blood Gluc Receiver (DEXCOM G6 RECEIVER) DEVI 1 Device by Does not apply route 3 (three) times daily before meals. 1 each 1   Continuous Blood Gluc Sensor (DEXCOM G6 SENSOR) MISC Inject 1 each into the skin 3 (three) times daily. 3 each 1   Continuous Blood Gluc Transmit (DEXCOM G6 TRANSMITTER) MISC 1 Device by Does not apply route 3 (three) times daily before meals. 1 each 1   diclofenac Sodium (VOLTAREN) 1 % GEL apply A SMALL AMOUNT TO involved joints UP TO TWICE DAILY     folic acid (FOLVITE) 1 MG tablet TAKE 1 TABLET BY MOUTH EVERY MORNING 30 tablet 0   furosemide (LASIX) 40 MG tablet Take by mouth.     Glucagon (GVOKE HYPOPEN 2-PACK) 1 MG/0.2ML  SOAJ Inject 1 mg into the skin as needed. 0.2 mL 5   hydrALAZINE (APRESOLINE) 25 MG tablet Take 0.5 tablets (12.5 mg total) by mouth 3 (three) times daily. 135 tablet 3   hydroxychloroquine (PLAQUENIL) 200 MG tablet Take 1 tablet (200 mg total) by mouth 2 (two) times daily. 180 tablet 0   insulin degludec (TRESIBA FLEXTOUCH) 100 UNIT/ML FlexTouch Pen Inject 10 Units into the skin daily. 9 mL 1   insulin lispro (HUMALOG KWIKPEN) 100 UNIT/ML KwikPen Use according to sliding scale 15 mL 11   Insulin Pen Needle (NOVOFINE PLUS PEN NEEDLE) 32G X 4 MM MISC Use with insulin pens dx code e11.65 300 each 3   Ipratropium-Albuterol (COMBIVENT RESPIMAT) 20-100  MCG/ACT AERS respimat Inhale 1 puff into the lungs every 6 (six) hours. 4 g 5   ipratropium-albuterol (DUONEB) 0.5-2.5 (3) MG/3ML SOLN Take 3 mLs by nebulization every 6 (six) hours as needed. 360 mL 2   Lactobacillus (ACIDOPHILUS) CAPS capsule Take 1 capsule by mouth daily.     Magnesium 400 MG TABS Take 1 tablet by mouth daily. 90 tablet 1   metFORMIN (GLUCOPHAGE) 500 MG tablet Take by mouth.     metFORMIN (GLUCOPHAGE) 500 MG tablet Take 1 tablet (500 mg total) by mouth 2 (two) times daily. 180 tablet 1   methotrexate (RHEUMATREX) 2.5 MG tablet Take 20 mg by mouth every Friday.   3   metolazone (ZAROXOLYN) 2.5 MG tablet TAKE 1 TABLET BY MOUTH AS NEEDED FOR WEIGHT GAIN OF 5 POUNDS WITHIN 3 DAYS AS DIRECTED needs appt for further refills 5 tablet 3   Multiple Vitamin (MULTIVITAMIN WITH MINERALS) TABS Take 1 tablet by mouth every morning.      OLUMIANT tablet Take 2 mg by mouth daily.     ondansetron (ZOFRAN-ODT) 8 MG disintegrating tablet Take 1 tablet (8 mg total) by mouth every 8 (eight) hours as needed for nausea or vomiting. 10 tablet 0   PARoxetine (PAXIL) 10 MG tablet Take 1 tablet (10 mg total) by mouth every morning. 30 tablet 2   potassium chloride (KLOR-CON M) 10 MEQ tablet TAKE THREE TABLETS BY MOUTH TWICE DAILY 180 tablet 3   predniSONE (DELTASONE) 10 MG tablet Take 10 mg by mouth daily.     PROAIR HFA 108 (90 Base) MCG/ACT inhaler INHALE 2 PUFFS BY MOUTH EVERY DAY AS NEEDED FOR SHORTNESS OF BREATH 18 g 2   sacubitril-valsartan (ENTRESTO) 97-103 MG Take 1 tablet by mouth 2 (two) times daily. 180 tablet 3   spironolactone (ALDACTONE) 25 MG tablet TAKE ONE TABLET BY MOUTH EVERY MORNING and TAKE ONE TABLET BY MOUTH EVERY EVENING 60 tablet 3   torsemide (DEMADEX) 20 MG tablet TAKE TWO TABLETS BY MOUTH ONCE DAILY 180 tablet 3   traZODone (DESYREL) 50 MG tablet Take 50 mg by mouth at bedtime as needed.     Vitamin D, Ergocalciferol, (DRISDOL) 1.25 MG (50000 UNIT) CAPS capsule Take 1 capsule  (50,000 Units total) by mouth 2 (two) times a week. 12 capsule 3   No current facility-administered medications for this visit.    SURGICAL HISTORY:  Past Surgical History:  Procedure Laterality Date   ABDOMINAL HYSTERECTOMY  2009   ABDOMINAL WOUND DEHISCENCE  2009   BUNIONECTOMY Left 06/15/2020   CARDIAC CATHETERIZATION N/A 10/11/2016   Procedure: Right/Left Heart Cath and Coronary Angiography;  Surgeon: Jolaine Artist, MD;  Location: [facility];  Service: Cardiovascular;  Laterality: N/A;   DILATION AND CURETTAGE OF UTERUS  [place]  2009   abdomen   ICD IMPLANT  01/28/2018   ICD IMPLANT N/A 01/28/2018   Procedure: ICD IMPLANT;  Surgeon: Evans Lance, MD;  Location: [facility];  Service: Cardiovascular;  Laterality: N/A;   INCISE AND DRAIN ABCESS  2009 X 2   "abdomen after hysterectomy"   KNEE ARTHROSCOPY Right 1997   KNEE SURGERY Right    TUBAL LIGATION  1996    REVIEW OF SYSTEMS:  A comprehensive review of systems was negative except for: Constitutional: positive for fatigue   PHYSICAL EXAMINATION: General appearance: alert, cooperative, fatigued, and no distress Head: Normocephalic, without obvious abnormality, atraumatic Neck: no adenopathy, no JVD, supple, symmetrical, trachea midline, and thyroid not enlarged, symmetric, no tenderness/mass/nodules Lymph nodes: Cervical, supraclavicular, and axillary nodes normal. Resp: clear to auscultation bilaterally Back: symmetric, no curvature. ROM normal. No CVA tenderness. Cardio: regular rate and rhythm, S1, S2 normal, no murmur, click, rub or gallop GI: soft, non-tender; bowel sounds normal; no masses,  no organomegaly Extremities: extremities normal, atraumatic, no cyanosis or edema  ECOG PERFORMANCE STATUS: 1 - Symptomatic but completely ambulatory  Blood pressure (!) 151/89, temperature 98.3 F (36.8 C), temperature source Oral, resp. rate 16, height '5\' 7"'$  (1.702 m), weight 185 lb 8  oz (84.1 kg), SpO2 100 %.  LABORATORY DATA: Lab Results  Component Value Date   WBC 5.2 06/20/2022   HGB 10.1 (L) 06/20/2022   HCT 31.2 (L) 06/20/2022   MCV 86.4 06/20/2022   PLT 272 06/20/2022      Chemistry      Component Value Date/Time   NA 142 03/21/2022 1252   NA 145 (H) 02/26/2022 1542   K 3.6 03/21/2022 1252   CL 106 03/21/2022 1252   CO2 27 03/21/2022 1252   BUN 15 03/21/2022 1252   BUN 13 02/26/2022 1542   CREATININE 1.01 (H) 03/21/2022 1252   CREATININE 0.89 10/04/2016 0936      Component Value Date/Time   CALCIUM 9.2 03/21/2022 1252   ALKPHOS 48 03/21/2022 1252   AST 12 (L) 03/21/2022 1252   ALT 10 03/21/2022 1252   BILITOT 0.3 03/21/2022 1252       RADIOGRAPHIC STUDIES: CUP PACEART REMOTE DEVICE CHECK  Result Date: 05/31/2022 Scheduled remote reviewed. Normal device function.  4 VT, 5 NSVT, EGM's show regular tachy rhythm HR's 179-184, no therapy Next remote 91 days. LA   ASSESSMENT AND PLAN: This is a very pleasant 51 years old African-American female with history of iron deficiency anemia as well as sickle cell trait.  The patient is currently on multivitamins in addition to folic acid and she has been doing fine with no concerning complaints except for mild fatigue. Repeat CBC today showed stable anemia with hemoglobin of 10.1 and hematocrit of 31.2%.  She has normal white blood count as well as platelets count. Her iron study and ferritin are within the normal range. I recommended for the patient to continue her current treatment with the multivitamin and folic acid for now.  I do not see a rash to give the patient any iron infusion at this point. I will see her back for follow-up visit in 3 months for evaluation and repeat blood work. She was advised to call immediately if she has any concerning symptoms in the interval. The patient voices understanding of current disease status and treatment options and is in agreement with the current care plan.  All  questions were answered. The patient  knows to call the clinic with any problems, questions or concerns. We can certainly see the patient much sooner if necessary.  The total time spent in the appointment was 20 minutes.  Disclaimer: This note was dictated with voice recognition software. Similar sounding words can inadvertently be transcribed and may not be corrected upon review.

## 2022-06-21 DIAGNOSIS — Z794 Long term (current) use of insulin: Secondary | ICD-10-CM | POA: Diagnosis not present

## 2022-06-21 DIAGNOSIS — E119 Type 2 diabetes mellitus without complications: Secondary | ICD-10-CM | POA: Diagnosis not present

## 2022-06-24 DIAGNOSIS — M329 Systemic lupus erythematosus, unspecified: Secondary | ICD-10-CM | POA: Diagnosis not present

## 2022-06-24 DIAGNOSIS — Z79899 Other long term (current) drug therapy: Secondary | ICD-10-CM | POA: Diagnosis not present

## 2022-06-24 DIAGNOSIS — R300 Dysuria: Secondary | ICD-10-CM | POA: Diagnosis not present

## 2022-06-24 DIAGNOSIS — M351 Other overlap syndromes: Secondary | ICD-10-CM | POA: Diagnosis not present

## 2022-06-24 DIAGNOSIS — M069 Rheumatoid arthritis, unspecified: Secondary | ICD-10-CM | POA: Diagnosis not present

## 2022-06-24 DIAGNOSIS — Z79631 Long term (current) use of antimetabolite agent: Secondary | ICD-10-CM | POA: Diagnosis not present

## 2022-06-24 DIAGNOSIS — R350 Frequency of micturition: Secondary | ICD-10-CM | POA: Diagnosis not present

## 2022-06-24 DIAGNOSIS — R82998 Other abnormal findings in urine: Secondary | ICD-10-CM | POA: Diagnosis not present

## 2022-06-24 DIAGNOSIS — M549 Dorsalgia, unspecified: Secondary | ICD-10-CM | POA: Diagnosis not present

## 2022-06-26 ENCOUNTER — Encounter (HOSPITAL_COMMUNITY): Payer: Self-pay | Admitting: Internal Medicine

## 2022-06-26 ENCOUNTER — Encounter: Payer: Self-pay | Admitting: Nurse Practitioner

## 2022-06-26 DIAGNOSIS — Z96651 Presence of right artificial knee joint: Secondary | ICD-10-CM | POA: Diagnosis not present

## 2022-06-26 DIAGNOSIS — T8484XD Pain due to internal orthopedic prosthetic devices, implants and grafts, subsequent encounter: Secondary | ICD-10-CM | POA: Diagnosis not present

## 2022-06-26 DIAGNOSIS — Z471 Aftercare following joint replacement surgery: Secondary | ICD-10-CM | POA: Diagnosis not present

## 2022-06-26 DIAGNOSIS — R7309 Other abnormal glucose: Secondary | ICD-10-CM | POA: Diagnosis not present

## 2022-06-26 DIAGNOSIS — D688 Other specified coagulation defects: Secondary | ICD-10-CM | POA: Diagnosis not present

## 2022-07-02 ENCOUNTER — Ambulatory Visit (INDEPENDENT_AMBULATORY_CARE_PROVIDER_SITE_OTHER): Payer: Medicare Other | Admitting: Nurse Practitioner

## 2022-07-02 ENCOUNTER — Other Ambulatory Visit: Payer: Self-pay

## 2022-07-02 ENCOUNTER — Encounter: Payer: Self-pay | Admitting: Nurse Practitioner

## 2022-07-02 VITALS — BP 120/86 | HR 71 | Temp 98.3°F | Ht 67.0 in | Wt 187.0 lb

## 2022-07-02 DIAGNOSIS — Z6829 Body mass index (BMI) 29.0-29.9, adult: Secondary | ICD-10-CM

## 2022-07-02 DIAGNOSIS — M329 Systemic lupus erythematosus, unspecified: Secondary | ICD-10-CM | POA: Diagnosis not present

## 2022-07-02 DIAGNOSIS — M069 Rheumatoid arthritis, unspecified: Secondary | ICD-10-CM | POA: Diagnosis not present

## 2022-07-02 DIAGNOSIS — I10 Essential (primary) hypertension: Secondary | ICD-10-CM

## 2022-07-02 DIAGNOSIS — I110 Hypertensive heart disease with heart failure: Secondary | ICD-10-CM

## 2022-07-02 DIAGNOSIS — Z23 Encounter for immunization: Secondary | ICD-10-CM | POA: Diagnosis not present

## 2022-07-02 DIAGNOSIS — E876 Hypokalemia: Secondary | ICD-10-CM

## 2022-07-02 DIAGNOSIS — J4520 Mild intermittent asthma, uncomplicated: Secondary | ICD-10-CM | POA: Diagnosis not present

## 2022-07-02 DIAGNOSIS — E1165 Type 2 diabetes mellitus with hyperglycemia: Secondary | ICD-10-CM

## 2022-07-02 DIAGNOSIS — E1169 Type 2 diabetes mellitus with other specified complication: Secondary | ICD-10-CM

## 2022-07-02 DIAGNOSIS — I5022 Chronic systolic (congestive) heart failure: Secondary | ICD-10-CM

## 2022-07-02 MED ORDER — GVOKE HYPOPEN 2-PACK 1 MG/0.2ML ~~LOC~~ SOAJ: 1.0000 mg | SUBCUTANEOUS | 5 refills | Status: AC | PRN

## 2022-07-02 MED ORDER — PREDNISONE 10 MG PO TABS
10.0000 mg | ORAL_TABLET | Freq: Every day | ORAL | 1 refills | Status: DC
Start: 2022-07-02 — End: 2023-01-07

## 2022-07-02 MED ORDER — ATORVASTATIN CALCIUM 10 MG PO TABS: 10.0000 mg | ORAL_TABLET | Freq: Every day | ORAL | 2 refills | Status: DC

## 2022-07-02 NOTE — Progress Notes (Signed)
I,Christine Mcdowell,acting as a Education administrator for Christine Brine, FNP.,have documented all relevant documentation on the behalf of Christine Brine, FNP,as directed by  Christine Brine, FNP while in the presence of Christine Mcdowell, Christine Mcdowell.    Subjective:     Patient ID: Christine Mcdowell , female    DOB: 1971-12-06 , 51 y.o.   MRN: [id number]   Chief Complaint  Patient presents with   Diabetes   Hypertension    HPI  Pt presents today for bp & dm f/u. She would like a bruise on her inner thigh looked at.  She reports having knee surgery coming up on 07/16/2022 with Dr. Sallyanne Havers for right knee revision  Her transmitter has stopped working. She is on prednisone 5 mg daily this was decided with herself and Dr. Rogers Blocker. She continues to see Dr. Tempie Hoist and Dr. Sallyanne Havers. She continues to go to [facility]    Diabetes She presents for her follow-up diabetic visit. She has type 2 diabetes mellitus. Pertinent negatives for hypoglycemia include no confusion or nervousness/anxiousness. There are no diabetic associated symptoms. Pertinent negatives for diabetes include no polydipsia, no polyphagia and no polyuria. (She has drank juice to help increase her blood sugars. ) There are no diabetic complications. Risk factors for coronary artery disease include sedentary lifestyle and obesity. Current diabetic treatment includes oral agent (dual therapy) (tresiba (10 units nightly) and metformin - not having spikes.). She is compliant with treatment all of the time. Her weight is stable. She is following a generally healthy diet. When asked about meal planning, she reported none. She has not had a previous visit with a dietitian. She rarely participates in exercise. (When she eats up to 300 within 2 hours. She will eat a protein snack at night will be 130-140. She is checking her 4 times a day. She has not had any low blood sugars less than 70. ) An ACE inhibitor/angiotensin II receptor blocker is being taken. She does not  see a podiatrist.Eye exam is not current.  Hypertension This is a chronic problem. The current episode started more than 1 year ago. The problem is controlled. Pertinent negatives include no anxiety. Risk factors for coronary artery disease include obesity and sedentary lifestyle. Past treatments include diuretics and calcium channel blockers.     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   Anemia    Anginal pain (HCC)    Asthma    Cervical cancer ([place])    cervical 1996   CHF (congestive heart failure) ([place])    Diabetes mellitus without complication ([place])    steroid induced   Discoid lupus    Fibromyalgia    History of blood transfusion "several"   "related to anemia; had some w/hysterectomy also"   Hx of cardiovascular stress test    ETT-Myoview (9/15):  No ischemia, EF 52%; NORMAL   Hx of echocardiogram    Echo (9/15):  EF 50-55%, ant HK, Gr 1 DD, mild MR, mild LAE, no effusion   Hypertension    Iron deficiency anemia    h/o iron transfusions   Lupus (systemic lupus erythematosus) ([place])    Migraine    "a few/year" (07/03/2016)   Pneumonia 12/2015   RA (rheumatoid arthritis) ([place])    "all over" (07/03/2016)   Sickle cell trait ([place])    Stroke ([place]) 2014 X 1; 2015 X 2; 2016 X 1;    "right side of face more relaxed than the other; rare speech hesitation" (  07/03/2016)   Vaginal Pap smear, abnormal    ASCUS; HPV     Family History  Problem Relation Age of Onset   Arthritis Mother    Heart murmur Mother    Drug abuse Mother    Allergies Mother    Heart attack Father    Cushing syndrome Father    Depression Father    Allergies Father    Dementia Paternal Grandmother    Cancer Paternal Grandfather    Diabetes Maternal Grandmother    Hypertension Maternal Grandmother    Asthma Maternal Grandmother    Heart attack Maternal Grandfather    Breast cancer Neg Hx      Current Outpatient Medications:    aspirin 81  MG chewable tablet, Chew 162 mg by mouth daily. Taking 2 tablets, Disp: , Rfl:    carvedilol (COREG) 25 MG tablet, Take 1 tablet (25 mg total) by mouth 2 (two) times daily with a meal., Disp: 60 tablet, Rfl: 6   Continuous Blood Gluc Receiver (DEXCOM G6 RECEIVER) DEVI, 1 Device by Does not apply route 3 (three) times daily before meals., Disp: 1 each, Rfl: 1   diclofenac Sodium (VOLTAREN) 1 % GEL, apply A SMALL AMOUNT TO involved joints UP TO TWICE DAILY, Disp: , Rfl:    folic acid (FOLVITE) 1 MG tablet, TAKE 1 TABLET BY MOUTH EVERY MORNING, Disp: 30 tablet, Rfl: 0   furosemide (LASIX) 40 MG tablet, Take by mouth., Disp: , Rfl:    hydrALAZINE (APRESOLINE) 25 MG tablet, Take 0.5 tablets (12.5 mg total) by mouth 3 (three) times daily., Disp: 135 tablet, Rfl: 3   hydroxychloroquine (PLAQUENIL) 200 MG tablet, Take 1 tablet (200 mg total) by mouth 2 (two) times daily., Disp: 180 tablet, Rfl: 0   insulin lispro (HUMALOG KWIKPEN) 100 UNIT/ML KwikPen, Use according to sliding scale, Disp: 15 mL, Rfl: 11   Insulin Pen Needle (NOVOFINE PLUS PEN NEEDLE) 32G X 4 MM MISC, Use with insulin pens dx code e11.65, Disp: 300 each, Rfl: 3   Ipratropium-Albuterol (COMBIVENT RESPIMAT) 20-100 MCG/ACT AERS respimat, Inhale 1 puff into the lungs every 6 (six) hours., Disp: 4 g, Rfl: 5   ipratropium-albuterol (DUONEB) 0.5-2.5 (3) MG/3ML SOLN, Take 3 mLs by nebulization every 6 (six) hours as needed., Disp: 360 mL, Rfl: 2   Lactobacillus (ACIDOPHILUS) CAPS capsule, Take 1 capsule by mouth daily., Disp: , Rfl:    Magnesium 400 MG TABS, Take 1 tablet by mouth daily., Disp: 90 tablet, Rfl: 1   metFORMIN (GLUCOPHAGE) 500 MG tablet, Take 1 tablet (500 mg total) by mouth 2 (two) times daily., Disp: 180 tablet, Rfl: 1   methotrexate (RHEUMATREX) 2.5 MG tablet, Take 20 mg by mouth every Friday. , Disp: , Rfl: 3   metolazone (ZAROXOLYN) 2.5 MG tablet, TAKE 1 TABLET BY MOUTH AS NEEDED FOR WEIGHT GAIN OF 5 POUNDS WITHIN 3 DAYS AS  DIRECTED needs appt for further refills, Disp: 5 tablet, Rfl: 3   Multiple Vitamin (MULTIVITAMIN WITH MINERALS) TABS, Take 1 tablet by mouth every morning. , Disp: , Rfl:    OLUMIANT tablet, Take 2 mg by mouth daily., Disp: , Rfl:    ondansetron (ZOFRAN-ODT) 8 MG disintegrating tablet, Take 1 tablet (8 mg total) by mouth every 8 (eight) hours as needed for nausea or vomiting., Disp: 10 tablet, Rfl: 0   PARoxetine (PAXIL) 10 MG tablet, Take 1 tablet (10 mg total) by mouth every morning., Disp: 30 tablet, Rfl: 2   potassium chloride (KLOR-CON M) 10  MEQ tablet, TAKE THREE TABLETS BY MOUTH TWICE DAILY, Disp: 180 tablet, Rfl: 3   PROAIR HFA 108 (90 Base) MCG/ACT inhaler, INHALE 2 PUFFS BY MOUTH EVERY DAY AS NEEDED FOR SHORTNESS OF BREATH, Disp: 18 g, Rfl: 2   sacubitril-valsartan (ENTRESTO) 97-103 MG, Take 1 tablet by mouth 2 (two) times daily., Disp: 180 tablet, Rfl: 3   spironolactone (ALDACTONE) 25 MG tablet, TAKE ONE TABLET BY MOUTH EVERY MORNING and TAKE ONE TABLET BY MOUTH EVERY EVENING, Disp: 60 tablet, Rfl: 3   torsemide (DEMADEX) 20 MG tablet, TAKE TWO TABLETS BY MOUTH ONCE DAILY, Disp: 180 tablet, Rfl: 3   traZODone (DESYREL) 50 MG tablet, Take 50 mg by mouth at bedtime as needed., Disp: , Rfl:    Vitamin D, Ergocalciferol, (DRISDOL) 1.25 MG (50000 UNIT) CAPS capsule, Take 1 capsule (50,000 Units total) by mouth 2 (two) times a week., Disp: 12 capsule, Rfl: 3   atorvastatin (LIPITOR) 10 MG tablet, Take 1 tablet (10 mg total) by mouth daily., Disp: 90 tablet, Rfl: 2   cetirizine (ZYRTEC ALLERGY) 10 MG tablet, Take 1 tablet (10 mg total) by mouth daily., Disp: 30 tablet, Rfl: 0   Continuous Blood Gluc Sensor (DEXCOM G6 SENSOR) MISC, Inject 1 each into the skin 3 (three) times daily., Disp: 3 each, Rfl: 1   Continuous Blood Gluc Sensor (DEXCOM G7 SENSOR) MISC, Use to check blood sugar 3 times day dx code e11.65, Disp: 9 each, Rfl: 3   Continuous Blood Gluc Transmit (DEXCOM G6 TRANSMITTER) MISC, 1  Device by Does not apply route 3 (three) times daily before meals., Disp: 1 each, Rfl: 1   Glucagon (GVOKE HYPOPEN 2-PACK) 1 MG/0.2ML SOAJ, Inject 1 mg into the skin as needed., Disp: 0.2 mL, Rfl: 5   HYDROcodone bit-homatropine (HYDROMET) 5-1.5 MG/5ML syrup, Take 5 mLs by mouth every 6 (six) hours as needed for cough., Disp: 120 mL, Rfl: 0   insulin degludec (TRESIBA FLEXTOUCH) 100 UNIT/ML FlexTouch Pen, Inject 10 Units into the skin daily. (Patient taking differently: Inject 15 Units into the skin at bedtime.), Disp: 9 mL, Rfl: 1   predniSONE (DELTASONE) 10 MG tablet, Take 1 tablet (10 mg total) by mouth daily. Tapering down to 5 mg every 7 days (Patient taking differently: Take 5 mg by mouth daily.), Disp: 30 tablet, Rfl: 1   Allergies  Allergen Reactions   Hydrocodone-Acetaminophen Nausea And Vomiting   Hydromorphone Nausea And Vomiting    Other reaction(s): GI Upset (intolerance), Hypertension (intolerance) Raises blood pressure  Other reaction(s): GI Upset (intolerance), Hypertension (intolerance) Raises blood pressure to stroke level   Iodinated Contrast Media Other (See Comments)    Shuts down kidneys Shuts kidney function down   Other Other (See Comments) and Anaphylaxis    Spicy foods and seasonings Skin Prep "makes my skin peel off" Paper tape causes skin burns   Erythromycin Nausea And Vomiting   Latex Hives   Rinvoq [Upadacitinib] Other (See Comments)   Farxiga [Dapagliflozin] Other (See Comments)   Tape Other (See Comments)    "Skin burns"   Mircette [Desogestrel-Ethinyl Estradiol] Nausea And Vomiting and Rash     Review of Systems  Constitutional: Negative.   Respiratory: Negative.    Cardiovascular: Negative.   Endocrine: Negative for polydipsia, polyphagia and polyuria.  Neurological: Negative.   Psychiatric/Behavioral: Negative.  Negative for confusion. The patient is not nervous/anxious.      Today's Vitals   07/02/22 1455  BP: 120/86  Pulse: 71  Temp:  98.3 F (  36.8 C)  SpO2: 98%  Weight: 187 lb (84.8 kg)  Height: '5\' 7"'$  (1.702 m)  PainSc: 0-No pain   Body mass index is 29.29 kg/m.  Wt Readings from Last 3 Encounters:  07/24/22 192 lb (87.1 kg)  07/02/22 187 lb (84.8 kg)  06/20/22 185 lb 8 oz (84.1 kg)    Objective:  Physical Exam Vitals reviewed.  Constitutional:      General: She is not in acute distress.    Appearance: Normal appearance.  Cardiovascular:     Rate and Rhythm: Normal rate and regular rhythm.     Pulses: Normal pulses.     Heart sounds: No murmur heard. Pulmonary:     Effort: Pulmonary effort is normal. No respiratory distress.     Breath sounds: Normal breath sounds. No wheezing.  Skin:    General: Skin is warm and dry.     Capillary Refill: Capillary refill takes less than 2 seconds.     Findings: Bruising (improving bruise to inner thigh) present.  Neurological:     General: No focal deficit present.     Mental Status: She is alert and oriented to person, place, and time.     Cranial Nerves: No cranial nerve deficit.     Motor: No weakness.  Psychiatric:        Mood and Affect: Mood normal.        Behavior: Behavior normal.        Thought Content: Thought content normal.        Judgment: Judgment normal.         Assessment And Plan:     1. Type 2 diabetes mellitus with hyperglycemia, without long-term current use of insulin (HCC) Comments: She has been having low blood sugars ranging from 70-300, needs a new transmitter for her Dexcom. Diabetic foot exam done  2. Hypertensive heart disease with chronic systolic congestive heart failure ([place]) Comments: Blood pressure is well controlled, continue current medications. Continue follow up with Cardiology  3. Systemic lupus erythematosus, unspecified SLE type, unspecified organ involvement status ([place]) Comments: Continue follow up with Rheumatology  4. Rheumatoid arthritis, involving unspecified site, unspecified whether rheumatoid factor present  ([place]) Comments: Continue follow up with Rheumatology, she has continued on prednisone which has increased her sugar levels.   5. Intermittent asthma without complication, unspecified asthma severity Comments: She is to continue her inhalers as needed.   6. Hypokalemia Comments: Will recheck potassium today, she is on fluid pills which can cause hypokalemia  7. Immunization due Comments: Shingrix #1  - Varicella-zoster vaccine IM (Shingrix)  8. BMI 29.0-29.9,adult Encouraged to aim for BMI less than 25. Continue exercising as tolerated    Patient was given opportunity to ask questions. Patient verbalized understanding of the plan and was able to repeat key elements of the plan. All questions were answered to their satisfaction.  Christine Brine, FNP    I, Christine Brine, FNP, have reviewed all documentation for this visit. The documentation on 07/02/22 for the exam, diagnosis, procedures, and orders are all accurate and complete.   IF YOU HAVE BEEN REFERRED TO A SPECIALIST, IT MAY TAKE 1-2 WEEKS TO SCHEDULE/PROCESS THE REFERRAL. IF YOU HAVE NOT HEARD FROM US/SPECIALIST IN TWO WEEKS, PLEASE GIVE [place] A CALL AT [phone].   THE PATIENT IS ENCOURAGED TO PRACTICE SOCIAL DISTANCING DUE TO THE COVID-19 PANDEMIC.

## 2022-07-02 NOTE — Patient Instructions (Addendum)
Type 2 Diabetes Mellitus, Diagnosis, Adult Type 2 diabetes (type 2 diabetes mellitus) is a long-term (chronic) disease. It may happen when there is one or both of these problems: The pancreas does not make enough insulin. The body does not react in a normal way to insulin that it makes. Insulin lets sugars go into cells in your body. If you have type 2 diabetes, sugars cannot get into your cells. Sugars build up in the blood. This causes high blood sugar. What are the causes? The exact cause of this condition is not known. What increases the risk? Having type 2 diabetes in your family. Being overweight or very overweight. Not being active. Your body not reacting in a normal way to the insulin it makes. Having higher than normal blood sugar over time. Having a type of diabetes when you were pregnant. Having a condition that causes small fluid-filled sacs on your ovaries. What are the signs or symptoms? At first, you may have no symptoms. You will get symptoms slowly. They may include: More thirst than normal. More hunger than normal. Needing to pee more than normal. Losing weight without trying. Feeling tired. Feeling weak. Seeing things blurry. Dark patches on your skin. How is this treated? This condition may be treated by a diabetes expert. You may need to: Follow an eating plan made by a food expert (dietitian). Get regular exercise. Find ways to deal with stress. Check blood sugar as often as told. Take medicines. Your doctor will set treatment goals for you. Your blood sugar should be at these levels: Before meals: 80-130 mg/dL (4.4-7.2 mmol/L). After meals: below 180 mg/dL (10 mmol/L). Over the last 2-3 months: less than 7%. Follow these instructions at home: Medicines Take your diabetes medicines or insulin every day. Take medicines as told to help you prevent other problems caused by this condition. You may need: Aspirin. Medicine to lower cholesterol. Medicine to  control blood pressure. Questions to ask your doctor Should I meet with a diabetes educator? What medicines do I need, and when should I take them? What will I need to treat my condition at home? When should I check my blood sugar? Where can I find a support group? Who can I call if I have questions? When is my next doctor visit? General instructions Take over-the-counter and prescription medicines only as told by your doctor. Keep all follow-up visits. Where to find more information For help and guidance and more information about diabetes, please go to: American Diabetes Association (ADA): www.diabetes.org American Association of Diabetes Care and Education Specialists (ADCES): www.diabeteseducator.org International Diabetes Federation (IDF): www.idf.org Contact a doctor if: Your blood sugar is at or above 240 mg/dL (13.3 mmol/L) for 2 days in a row. You have been sick for 2 days or more, and you are not getting better. You have had a fever for 2 days or more, and you are not getting better. You have any of these problems for more than 6 hours: You cannot eat or drink. You feel like you may vomit. You vomit. You have watery poop (diarrhea). Get help right away if: Your blood sugar is lower than 54 mg/dL (3 mmol/L). You feel mixed up (confused). You have trouble thinking clearly. You have trouble breathing. You have medium or large ketone levels in your pee. These symptoms may be an emergency. Get help right away. Call your local emergency services (911 in the [place] Do not wait to see if the symptoms will go away. Do not drive yourself   to the hospital. Summary Type 2 diabetes is a long-term disease. Your pancreas may not make enough insulin, or your body may not react in a normal way to insulin that it makes. This condition is treated with an eating plan, lifestyle changes, and medicines. Your doctor will set treatment goals for you. These will help you keep your blood sugar  in a healthy range. Keep all follow-up visits. This information is not intended to replace advice given to you by your health care provider. Make sure you discuss any questions you have with your health care provider. Document Revised: 03/05/2021 Document Reviewed: 03/05/2021 Elsevier Patient Education  [place].   Zoster Vaccine, Recombinant injection What is this medication? ZOSTER VACCINE (ZOS ter vak SEEN) is a vaccine used to reduce the risk of getting shingles. This vaccine is not used to treat shingles or nerve pain from shingles. This medicine may be used for other purposes; ask your health care provider or pharmacist if you have questions. COMMON BRAND NAME(S): [facility] What should I tell my care team before I take this medication? They need to know if you have any of these conditions: cancer immune system problems an unusual or allergic reaction to Zoster vaccine, other medications, foods, dyes, or preservatives pregnant or trying to get pregnant breast-feeding How should I use this medication? This vaccine is injected into a muscle. It is given by a health care provider. A copy of Vaccine Information Statements will be given before each vaccination. Be sure to read this information carefully each time. This sheet may change often. Talk to your health care provider about the use of this vaccine in children. This vaccine is not approved for use in children. Overdosage: If you think you have taken too much of this medicine contact a poison control center or emergency room at once. NOTE: This medicine is only for you. Do not share this medicine with others. What if I miss a dose? Keep appointments for follow-up (booster) doses. It is important not to miss your dose. Call your health care provider if you are unable to keep an appointment. What may interact with this medication? medicines that suppress your immune system medicines to treat cancer steroid medicines like  prednisone or cortisone This list may not describe all possible interactions. Give your health care provider a list of all the medicines, herbs, non-prescription drugs, or dietary supplements you use. Also tell them if you smoke, drink alcohol, or use illegal drugs. Some items may interact with your medicine. What should I watch for while using this medication? Visit your health care provider regularly. This vaccine, like all vaccines, may not fully protect everyone. What side effects may I notice from receiving this medication? Side effects that you should report to your doctor or health care professional as soon as possible: allergic reactions (skin rash, itching or hives; swelling of the face, lips, or tongue) trouble breathing Side effects that usually do not require medical attention (report these to your doctor or health care professional if they continue or are bothersome): chills headache fever nausea pain, redness, or irritation at site where injected tiredness vomiting This list may not describe all possible side effects. Call your doctor for medical advice about side effects. You may report side effects to FDA at 1-800-FDA-1088. Where should I keep my medication? This vaccine is only given by a health care provider. It will not be stored at home. NOTE: This sheet is a summary. It may not cover all possible information.  If you have questions about this medicine, talk to your doctor, pharmacist, or health care provider.  2023 Elsevier/Gold Standard (2021-11-09 00:00:00)

## 2022-07-03 ENCOUNTER — Telehealth: Payer: Self-pay

## 2022-07-03 ENCOUNTER — Other Ambulatory Visit: Payer: Self-pay

## 2022-07-03 MED ORDER — DEXCOM G6 TRANSMITTER MISC: 1.0000 | Freq: Three times a day (TID) | 1 refills | Status: DC

## 2022-07-03 MED ORDER — ATORVASTATIN CALCIUM 10 MG PO TABS: 10.0000 mg | ORAL_TABLET | Freq: Every day | ORAL | 2 refills | Status: DC

## 2022-07-03 NOTE — Progress Notes (Addendum)
07-03-2022: Completed acute form in [employer] for Glucagon to be delivered 07-04-2022 on the 2nd route per patient. Pharmacy tech informed. Patient also stated she is in need of the dexcom sensor sent to [facility]. She told PCP that the transmitter was needed. Cleophus Molt a message.  [place] Pharmacist Assistant [phone]

## 2022-07-04 ENCOUNTER — Telehealth: Payer: Medicare Other

## 2022-07-04 ENCOUNTER — Telehealth: Payer: Self-pay

## 2022-07-04 ENCOUNTER — Other Ambulatory Visit: Payer: Self-pay

## 2022-07-04 MED ORDER — DEXCOM G6 SENSOR MISC: 1.0000 | Freq: Three times a day (TID) | 1 refills | Status: DC

## 2022-07-04 NOTE — Telephone Encounter (Signed)
  Care Management   Follow Up Note   07/04/2022 Name: Christine Mcdowell MRN: [id number] DOB: [birth year]   Referred by: [name], FNP Reason for referral : Care Coordination   An unsuccessful telephone outreach was attempted today. The patient was referred to the case management team for assistance with care management and care coordination.   Follow Up Plan: Telephone follow up appointment with care management team member scheduled for: 07/12/22  Barb Merino, RN, BSN, CCM Care Management Coordinator [facility]  Direct Phone: [phone]

## 2022-07-08 DIAGNOSIS — R29898 Other symptoms and signs involving the musculoskeletal system: Secondary | ICD-10-CM | POA: Diagnosis not present

## 2022-07-08 DIAGNOSIS — Z7409 Other reduced mobility: Secondary | ICD-10-CM | POA: Diagnosis not present

## 2022-07-08 DIAGNOSIS — T8484XD Pain due to internal orthopedic prosthetic devices, implants and grafts, subsequent encounter: Secondary | ICD-10-CM | POA: Diagnosis not present

## 2022-07-08 DIAGNOSIS — Z96651 Presence of right artificial knee joint: Secondary | ICD-10-CM | POA: Diagnosis not present

## 2022-07-08 DIAGNOSIS — Z789 Other specified health status: Secondary | ICD-10-CM | POA: Diagnosis not present

## 2022-07-09 DIAGNOSIS — G8929 Other chronic pain: Secondary | ICD-10-CM | POA: Diagnosis not present

## 2022-07-09 DIAGNOSIS — T8484XS Pain due to internal orthopedic prosthetic devices, implants and grafts, sequela: Secondary | ICD-10-CM | POA: Diagnosis not present

## 2022-07-09 DIAGNOSIS — M25561 Pain in right knee: Secondary | ICD-10-CM | POA: Diagnosis not present

## 2022-07-09 DIAGNOSIS — Z96651 Presence of right artificial knee joint: Secondary | ICD-10-CM | POA: Diagnosis not present

## 2022-07-10 ENCOUNTER — Telehealth: Payer: Self-pay

## 2022-07-10 NOTE — Chronic Care Management (AMB) (Addendum)
Chronic Care Management Pharmacy Assistant   Name: Tita Terhaar  MRN: [id number] DOB: 1971/01/20  Reason for Encounter: Medication Review/ Medication coordination  Recent office visits:  07-02-2022 [name], [place]. CHANGE prednisone 10 mg daily TO prednisone 10 mg daily Tapering down to 5 mg every 7 days.  Recent consult visits:  07-08-2022 Sheron Nightingale, PT  (Physical therapy). Physical therapy for right leg weakness.  06-26-2022 Louann Liv, MD (Orthopaedic surgery). Unable to view encounter.  06-24-2022 Girard Cooter, MD (Rheumatology). Follow up visit.  06-20-2022 [name], MD (Oncology). Follow up visit no changes.  Hospital visits:  Medication Reconciliation was completed by comparing discharge summary, patient's EMR and Pharmacy list, and upon discussion with patient.   Admitted to the hospital on 03-05-2022 due to  Chest pain. Discharge date was 03-05-2022. Discharged from [place]?Medications Started at [facility] Discharge:?? None   Medication Changes at Hospital Discharge: None   Medications Discontinued at Hospital Discharge: None   Medications that remain the same after Hospital Discharge:??  -All other medications will remain the same.     Hospital visits:  Medication Reconciliation was completed by comparing discharge summary, patient's EMR and Pharmacy list, and upon discussion with patient.   Admitted to the hospital on 02-25-2022 due to Shortness of breath. Discharge date was 02-25-2022. Discharged from [place]?Medications Started at [facility] Discharge:?? None   Medication Changes at Hospital Discharge: None   Medications Discontinued at Hospital Discharge: None   Medications that remain the same after Hospital Discharge:??  -All other medications will remain the same.    Medications: Outpatient Encounter Medications as of 07/10/2022   Medication Sig   aspirin 81 MG chewable tablet Chew 162 mg by mouth daily. Taking 2 tablets   atorvastatin (LIPITOR) 10 MG tablet Take 1 tablet (10 mg total) by mouth daily.   carvedilol (COREG) 25 MG tablet Take 1 tablet (25 mg total) by mouth 2 (two) times daily with a meal.   Continuous Blood Gluc Receiver (DEXCOM G6 RECEIVER) DEVI 1 Device by Does not apply route 3 (three) times daily before meals.   Continuous Blood Gluc Sensor (DEXCOM G6 SENSOR) MISC Inject 1 each into the skin 3 (three) times daily.   Continuous Blood Gluc Transmit (DEXCOM G6 TRANSMITTER) MISC 1 Device by Does not apply route 3 (three) times daily before meals.   diclofenac Sodium (VOLTAREN) 1 % GEL apply A SMALL AMOUNT TO involved joints UP TO TWICE DAILY   folic acid (FOLVITE) 1 MG tablet TAKE 1 TABLET BY MOUTH EVERY MORNING   furosemide (LASIX) 40 MG tablet Take by mouth.   Glucagon (GVOKE HYPOPEN 2-PACK) 1 MG/0.2ML SOAJ Inject 1 mg into the skin as needed.   hydrALAZINE (APRESOLINE) 25 MG tablet Take 0.5 tablets (12.5 mg total) by mouth 3 (three) times daily.   hydroxychloroquine (PLAQUENIL) 200 MG tablet Take 1 tablet (200 mg total) by mouth 2 (two) times daily.   insulin degludec (TRESIBA FLEXTOUCH) 100 UNIT/ML FlexTouch Pen Inject 10 Units into the skin daily.   insulin lispro (HUMALOG KWIKPEN) 100 UNIT/ML KwikPen Use according to sliding scale   Insulin Pen Needle (NOVOFINE PLUS PEN NEEDLE) 32G X 4 MM MISC Use with insulin pens dx code e11.65   Ipratropium-Albuterol (COMBIVENT RESPIMAT) 20-100 MCG/ACT AERS respimat Inhale 1 puff into the lungs every 6 (six) hours.   ipratropium-albuterol (DUONEB) 0.5-2.5 (3) MG/3ML SOLN Take 3 mLs  by nebulization every 6 (six) hours as needed.   Lactobacillus (ACIDOPHILUS) CAPS capsule Take 1 capsule by mouth daily.   Magnesium 400 MG TABS Take 1 tablet by mouth daily.   metFORMIN (GLUCOPHAGE) 500 MG tablet Take 1 tablet (500 mg total) by mouth 2 (two) times daily.    methotrexate (RHEUMATREX) 2.5 MG tablet Take 20 mg by mouth every Friday.    metolazone (ZAROXOLYN) 2.5 MG tablet TAKE 1 TABLET BY MOUTH AS NEEDED FOR WEIGHT GAIN OF 5 POUNDS WITHIN 3 DAYS AS DIRECTED needs appt for further refills   Multiple Vitamin (MULTIVITAMIN WITH MINERALS) TABS Take 1 tablet by mouth every morning.    OLUMIANT tablet Take 2 mg by mouth daily.   ondansetron (ZOFRAN-ODT) 8 MG disintegrating tablet Take 1 tablet (8 mg total) by mouth every 8 (eight) hours as needed for nausea or vomiting.   PARoxetine (PAXIL) 10 MG tablet Take 1 tablet (10 mg total) by mouth every morning.   potassium chloride (KLOR-CON M) 10 MEQ tablet TAKE THREE TABLETS BY MOUTH TWICE DAILY   predniSONE (DELTASONE) 10 MG tablet Take 1 tablet (10 mg total) by mouth daily. Tapering down to 5 mg every 7 days   PROAIR HFA 108 (90 Base) MCG/ACT inhaler INHALE 2 PUFFS BY MOUTH EVERY DAY AS NEEDED FOR SHORTNESS OF BREATH   sacubitril-valsartan (ENTRESTO) 97-103 MG Take 1 tablet by mouth 2 (two) times daily.   spironolactone (ALDACTONE) 25 MG tablet TAKE ONE TABLET BY MOUTH EVERY MORNING and TAKE ONE TABLET BY MOUTH EVERY EVENING   torsemide (DEMADEX) 20 MG tablet TAKE TWO TABLETS BY MOUTH ONCE DAILY   traZODone (DESYREL) 50 MG tablet Take 50 mg by mouth at bedtime as needed.   Vitamin D, Ergocalciferol, (DRISDOL) 1.25 MG (50000 UNIT) CAPS capsule Take 1 capsule (50,000 Units total) by mouth 2 (two) times a week.   No facility-administered encounter medications on file as of 07/10/2022.  Reviewed chart for medication changes ahead of medication coordination call.  No medication changes indicated   BP Readings from Last 3 Encounters:  07/02/22 120/86  06/20/22 (!) 151/89  03/21/22 (!) 128/91    Lab Results  Component Value Date   HGBA1C 7.0 (H) 02/26/2022     Patient obtains medications through Vials  30 Days   Last adherence delivery included:  Insulin Lispro (Humalog)- use according to sliding scale-  max 15 units Entresto 97 mg/103 mg- one tablet twice a day TruePlus Pen Needles 32 G-use as directed Vitamin D 50,000 units- one capsules two times a week, Tuesday and Friday Atorvastatin 10 mg- one tablet daily Folic acid 1 mg- daily Tresiba 100 u/ml- Inject 10 units into skin daily Spironolactone 25 mg- one tablet twice a day Methotrexate 2.5 mg- 8 tabs every Friday Trazodone 50 mg- 1 tablet at bedtime as needed for sleep Carvedilol 25 mg- One tablet twice a day Metformin 500 mg- one tablet twice a day Potassium CL ER 10 Meq- 3 tabs twice daily Torsemide 20 mg twice daily  Patient declined (meds) last month: None  Patient is due for next adherence delivery on: 07-22-2022  Called patient and reviewed medications and coordinated delivery.  This delivery to include: Insulin Lispro (Humalog)- use according to sliding scale- Max dose 15 units Entresto 97 mg/103 mg- one tablet twice a day TruePlus Pen Needles 32 G-use as directed Vitamin D 50,000 units- one capsules two times a week, Tuesday and Friday Atorvastatin 10 mg- one tablet daily Folic acid 1 mg- daily  Tresiba 100 u/ml- Inject 10 units into skin daily Spironolactone 25 mg- one tablet twice a day Methotrexate 2.5 mg- 8 tabs every Friday Trazodone 50 mg- 1 tablet at bedtime as needed for sleep Carvedilol 25 mg- One tablet twice a day Metformin 500 mg- one tablet twice a day Potassium CL ER 10 Meq- 3 tabs twice daily Torsemide 20 mg twice daily  No acute/short fill needed  Patient declined the following medications: None  Patient needs refills for:  Folic acid- Sent to specialist by Audie Clear- Sent to PCP  Confirmed delivery date of 07-22-2022 advised patient that pharmacy will contact them the morning of delivery.  07-15-2022: Message from chasity at [facility]: Just wanting to confirm... Max daily dose on humalog is 15 units is that daily? Because if that is the case then the patient has been getting this way  more often then she should... We have been sending out 73m's for a 30DS and she has been getting it every 30 days... is she aware her max daily dose is 15 units?Just checking because if max daily dose is 15 units then 1 box of 165ms should last her 100 days... not 30. Sent JaDoreene Burkend Ya[place]van[place] message to confirm.  07-18-2022: [facility] and [facility]ing for a reply.  Care Gaps: Covid booster overdue Yearly foot exam overdue Yearly ophthalmology exam overdue AWV 10-03-2022  Star Rating Drugs: Entresto 97-103 mg- Last filled 06-17-2022 30 [facility] Metformin 500 mg- Last filled 06-17-2022 [facility] Atorvastatin 10 mg- Last filled 06-17-2022 [facility]  [facility]ionharmacist Assistant [phone]

## 2022-07-11 DIAGNOSIS — E1165 Type 2 diabetes mellitus with hyperglycemia: Secondary | ICD-10-CM | POA: Diagnosis not present

## 2022-07-12 ENCOUNTER — Telehealth: Payer: Self-pay

## 2022-07-12 ENCOUNTER — Telehealth: Payer: Medicare Other

## 2022-07-12 ENCOUNTER — Other Ambulatory Visit: Payer: Self-pay

## 2022-07-12 DIAGNOSIS — E1165 Type 2 diabetes mellitus with hyperglycemia: Secondary | ICD-10-CM

## 2022-07-12 MED ORDER — TRESIBA FLEXTOUCH 100 UNIT/ML ~~LOC~~ SOPN: 10.0000 [IU] | PEN_INJECTOR | Freq: Every day | SUBCUTANEOUS | 1 refills | Status: DC

## 2022-07-12 NOTE — Telephone Encounter (Signed)
  Care Management   Follow Up Note   07/12/2022 Name: Christine Mcdowell MRN: [id number] DOB: July 26, 1971   Referred by: [name], FNP Reason for referral : Care Coordination   A second unsuccessful telephone outreach was attempted today. The patient was referred to the case management team for assistance with care management and care coordination.   Follow Up Plan: Telephone follow up appointment with care management team member scheduled for: 07/15/22  Barb Merino, RN, BSN, CCM Care Management Coordinator [facility]  Direct Phone: [phone]

## 2022-07-15 ENCOUNTER — Ambulatory Visit: Payer: Self-pay

## 2022-07-15 DIAGNOSIS — Z96651 Presence of right artificial knee joint: Secondary | ICD-10-CM | POA: Diagnosis not present

## 2022-07-15 DIAGNOSIS — D688 Other specified coagulation defects: Secondary | ICD-10-CM | POA: Diagnosis not present

## 2022-07-15 NOTE — Progress Notes (Signed)
This encounter was created in error - please disregard.

## 2022-07-15 NOTE — Patient Instructions (Signed)
Visit Information  Thank you for taking time to visit with me today. Please don't hesitate to contact me if I can be of assistance to you.   Following are the goals we discussed today:   Goals Addressed     Patient Stated     to recover from my knee surgery without complications (pt-stated)        Care Coordination Interventions: Evaluation of current treatment plan related to right knee joint revision and patient's adherence to plan as established by provider Advised patient, providing education and rationale, to check cbg daily before meals and record, calling PCP, if you feel your blood sugar is too high or too low for findings outside established parameters  Pharmacy referral via in basket message for assistance with switching Dexcom scanner to upgraded Dexcom and or [employer] with provider regarding Rx is needed for manual blood glucose checks while awaiting approval for her Dexcom sensor refill  Assessed social determinant of health barriers, none identified       Our next appointment is by telephone on 08/07/22 at 0900  Please call the care guide team at [phone] if you need to cancel or reschedule your appointment.   If you are experiencing a Mental Health or [place] or need someone to talk to, please call [phone]-TALK (toll free, 24 hour hotline)   Patient verbalizes understanding of instructions and care plan provided today and agrees to view in [place]. Active MyChart status and patient understanding of how to access instructions and care plan via MyChart confirmed with patient.     Telephone follow up appointment with care management team member scheduled for:  Barb Merino, RN, BSN, CCM Care Management Coordinator [place] [facility] Direct Phone: [phone]

## 2022-07-15 NOTE — Patient Outreach (Signed)
  Care Coordination   Follow Up Visit Note   07/15/2022 Name: Christine Mcdowell MRN: [id number] DOB: Feb 06, 1971  Christine Mcdowell is a 51 y.o. year old female who sees [name], [place] for primary care. I spoke with  [name] by phone today  What matters to the patients health and wellness today?  To recover from knee surgery without complications.    Goals Addressed     Patient Stated     to recover from my knee surgery without complications (pt-stated)        Care Coordination Interventions: Evaluation of current treatment plan related to right knee joint revision and patient's adherence to plan as established by provider Advised patient, providing education and rationale, to check cbg daily before meals and record, calling PCP, if you feel your blood sugar is too high or too low for findings outside established parameters  Pharmacy referral via in basket message for assistance with switching Dexcom scanner to upgraded Dexcom and or Careers adviser with provider regarding Rx is needed for manual blood glucose checks while awaiting approval for her Dexcom sensor refill  Assessed social determinant of health barriers, none identified         SDOH assessments and interventions completed:    Yes  Care Coordination Interventions Activated:  Yes Care Coordination Interventions:  Yes, provided  Follow up plan: Follow up call scheduled for 08/07/22  Encounter Outcome:  Pt. Visit Completed

## 2022-07-16 DIAGNOSIS — Z7984 Long term (current) use of oral hypoglycemic drugs: Secondary | ICD-10-CM | POA: Diagnosis not present

## 2022-07-16 DIAGNOSIS — E119 Type 2 diabetes mellitus without complications: Secondary | ICD-10-CM | POA: Diagnosis not present

## 2022-07-16 DIAGNOSIS — I5042 Chronic combined systolic (congestive) and diastolic (congestive) heart failure: Secondary | ICD-10-CM | POA: Diagnosis not present

## 2022-07-16 DIAGNOSIS — D65 Disseminated intravascular coagulation [defibrination syndrome]: Secondary | ICD-10-CM | POA: Diagnosis not present

## 2022-07-16 DIAGNOSIS — Z8673 Personal history of transient ischemic attack (TIA), and cerebral infarction without residual deficits: Secondary | ICD-10-CM | POA: Diagnosis not present

## 2022-07-16 DIAGNOSIS — T84022A Instability of internal right knee prosthesis, initial encounter: Secondary | ICD-10-CM | POA: Diagnosis not present

## 2022-07-16 DIAGNOSIS — G8918 Other acute postprocedural pain: Secondary | ICD-10-CM | POA: Diagnosis not present

## 2022-07-16 DIAGNOSIS — M65161 Other infective (teno)synovitis, right knee: Secondary | ICD-10-CM | POA: Diagnosis not present

## 2022-07-16 DIAGNOSIS — G43909 Migraine, unspecified, not intractable, without status migrainosus: Secondary | ICD-10-CM | POA: Diagnosis not present

## 2022-07-16 DIAGNOSIS — I420 Dilated cardiomyopathy: Secondary | ICD-10-CM | POA: Diagnosis not present

## 2022-07-16 DIAGNOSIS — M329 Systemic lupus erythematosus, unspecified: Secondary | ICD-10-CM | POA: Diagnosis not present

## 2022-07-16 DIAGNOSIS — Z96651 Presence of right artificial knee joint: Secondary | ICD-10-CM | POA: Diagnosis not present

## 2022-07-16 DIAGNOSIS — G4733 Obstructive sleep apnea (adult) (pediatric): Secondary | ICD-10-CM | POA: Diagnosis not present

## 2022-07-16 DIAGNOSIS — T8484XD Pain due to internal orthopedic prosthetic devices, implants and grafts, subsequent encounter: Secondary | ICD-10-CM | POA: Diagnosis not present

## 2022-07-16 DIAGNOSIS — Z8616 Personal history of COVID-19: Secondary | ICD-10-CM | POA: Diagnosis not present

## 2022-07-16 DIAGNOSIS — T380X5A Adverse effect of glucocorticoids and synthetic analogues, initial encounter: Secondary | ICD-10-CM | POA: Diagnosis not present

## 2022-07-16 DIAGNOSIS — E099 Drug or chemical induced diabetes mellitus without complications: Secondary | ICD-10-CM | POA: Diagnosis not present

## 2022-07-16 DIAGNOSIS — Z981 Arthrodesis status: Secondary | ICD-10-CM | POA: Diagnosis not present

## 2022-07-16 DIAGNOSIS — K219 Gastro-esophageal reflux disease without esophagitis: Secondary | ICD-10-CM | POA: Diagnosis not present

## 2022-07-16 DIAGNOSIS — D508 Other iron deficiency anemias: Secondary | ICD-10-CM | POA: Diagnosis not present

## 2022-07-16 DIAGNOSIS — E876 Hypokalemia: Secondary | ICD-10-CM | POA: Diagnosis not present

## 2022-07-16 DIAGNOSIS — Z794 Long term (current) use of insulin: Secondary | ICD-10-CM | POA: Diagnosis not present

## 2022-07-16 DIAGNOSIS — I10 Essential (primary) hypertension: Secondary | ICD-10-CM | POA: Diagnosis not present

## 2022-07-16 DIAGNOSIS — I110 Hypertensive heart disease with heart failure: Secondary | ICD-10-CM | POA: Diagnosis not present

## 2022-07-16 DIAGNOSIS — M797 Fibromyalgia: Secondary | ICD-10-CM | POA: Diagnosis not present

## 2022-07-16 DIAGNOSIS — M069 Rheumatoid arthritis, unspecified: Secondary | ICD-10-CM | POA: Diagnosis not present

## 2022-07-16 DIAGNOSIS — M25561 Pain in right knee: Secondary | ICD-10-CM | POA: Diagnosis not present

## 2022-07-16 DIAGNOSIS — Z471 Aftercare following joint replacement surgery: Secondary | ICD-10-CM | POA: Diagnosis not present

## 2022-07-16 DIAGNOSIS — T8484XA Pain due to internal orthopedic prosthetic devices, implants and grafts, initial encounter: Secondary | ICD-10-CM | POA: Diagnosis not present

## 2022-07-16 DIAGNOSIS — E785 Hyperlipidemia, unspecified: Secondary | ICD-10-CM | POA: Diagnosis not present

## 2022-07-16 DIAGNOSIS — J45909 Unspecified asthma, uncomplicated: Secondary | ICD-10-CM | POA: Diagnosis not present

## 2022-07-16 DIAGNOSIS — M351 Other overlap syndromes: Secondary | ICD-10-CM | POA: Diagnosis not present

## 2022-07-16 DIAGNOSIS — I429 Cardiomyopathy, unspecified: Secondary | ICD-10-CM | POA: Diagnosis not present

## 2022-07-19 ENCOUNTER — Other Ambulatory Visit: Payer: Self-pay

## 2022-07-19 DIAGNOSIS — Z96651 Presence of right artificial knee joint: Secondary | ICD-10-CM | POA: Diagnosis not present

## 2022-07-19 DIAGNOSIS — R29898 Other symptoms and signs involving the musculoskeletal system: Secondary | ICD-10-CM | POA: Diagnosis not present

## 2022-07-19 DIAGNOSIS — T8484XD Pain due to internal orthopedic prosthetic devices, implants and grafts, subsequent encounter: Secondary | ICD-10-CM | POA: Diagnosis not present

## 2022-07-19 DIAGNOSIS — Z7409 Other reduced mobility: Secondary | ICD-10-CM | POA: Diagnosis not present

## 2022-07-19 DIAGNOSIS — Z789 Other specified health status: Secondary | ICD-10-CM | POA: Diagnosis not present

## 2022-07-19 MED ORDER — DEXCOM G7 SENSOR MISC: 3 refills | Status: DC

## 2022-07-19 NOTE — Progress Notes (Signed)
This encounter was created in error - please disregard.

## 2022-07-23 ENCOUNTER — Encounter: Payer: Self-pay | Admitting: Nurse Practitioner

## 2022-07-23 DIAGNOSIS — Z96651 Presence of right artificial knee joint: Secondary | ICD-10-CM | POA: Diagnosis not present

## 2022-07-23 DIAGNOSIS — Z7409 Other reduced mobility: Secondary | ICD-10-CM | POA: Diagnosis not present

## 2022-07-23 DIAGNOSIS — Z789 Other specified health status: Secondary | ICD-10-CM | POA: Diagnosis not present

## 2022-07-23 DIAGNOSIS — R29898 Other symptoms and signs involving the musculoskeletal system: Secondary | ICD-10-CM | POA: Diagnosis not present

## 2022-07-23 DIAGNOSIS — T8484XD Pain due to internal orthopedic prosthetic devices, implants and grafts, subsequent encounter: Secondary | ICD-10-CM | POA: Diagnosis not present

## 2022-07-24 ENCOUNTER — Encounter: Payer: Self-pay | Admitting: Internal Medicine

## 2022-07-24 ENCOUNTER — Telehealth (INDEPENDENT_AMBULATORY_CARE_PROVIDER_SITE_OTHER): Payer: Medicare Other | Admitting: Internal Medicine

## 2022-07-24 VITALS — Ht 67.0 in | Wt 192.0 lb

## 2022-07-24 DIAGNOSIS — E7800 Pure hypercholesterolemia, unspecified: Secondary | ICD-10-CM | POA: Diagnosis not present

## 2022-07-24 DIAGNOSIS — M329 Systemic lupus erythematosus, unspecified: Secondary | ICD-10-CM

## 2022-07-24 DIAGNOSIS — U071 COVID-19: Secondary | ICD-10-CM | POA: Diagnosis not present

## 2022-07-24 MED ORDER — LAGEVRIO 200 MG PO CAPS: 4.0000 | ORAL_CAPSULE | Freq: Two times a day (BID) | ORAL | 0 refills | Status: AC

## 2022-07-24 MED ORDER — CETIRIZINE HCL 10 MG PO TABS: 10.0000 mg | ORAL_TABLET | Freq: Every day | ORAL | 0 refills | Status: DC

## 2022-07-24 MED ORDER — HYDROCODONE BIT-HOMATROP MBR 5-1.5 MG/5ML PO SOLN: 5.0000 mL | Freq: Four times a day (QID) | ORAL | 0 refills | Status: DC | PRN

## 2022-07-24 NOTE — Patient Instructions (Signed)
COVID-19: Quarantine and Isolation °Quarantine °If you were exposed °Quarantine and stay away from others when you have been in close contact with someone who has COVID-19. °Isolate °If you are sick or test positive °Isolate when you are sick or when you have COVID-19, even if you don't have symptoms. °When to stay home °Calculating quarantine °The date of your exposure is considered day 0. Day 1 is the first full day after your last contact with a person who has had COVID-19. Stay home and away from other people for at least 5 days. Learn why CDC updated guidance for the general public. °IF YOU were exposed to COVID-19 and are NOT  °up to dateIF YOU were exposed to COVID-19 and are NOT on COVID-19 vaccinations °Quarantine for at least 5 days °Stay home °Stay home and quarantine for at least 5 full days. °Wear a well-fitting mask if you must be around others in your home. °Do not travel. °Get tested °Even if you don't develop symptoms, get tested at least 5 days after you last had close contact with someone with COVID-19. °After quarantine °Watch for symptoms °Watch for symptoms until 10 days after you last had close contact with someone with COVID-19. °Avoid travel °It is best to avoid travel until a full 10 days after you last had close contact with someone with COVID-19. °If you develop symptoms °Isolate immediately and get tested. Continue to stay home until you know the results. Wear a well-fitting mask around others. °Take precautions until day 10 °Wear a well-fitting mask °Wear a well-fitting mask for 10 full days any time you are around others inside your home or in public. Do not go to places where you are unable to wear a well-fitting mask. °If you must travel during days 6-10, take precautions. °Avoid being around people who are more likely to get very sick from COVID-19. °IF YOU were exposed to COVID-19 and are  °up to dateIF YOU were exposed to COVID-19 and are on COVID-19 vaccinations °No  quarantine °You do not need to stay home unless you develop symptoms. °Get tested °Even if you don't develop symptoms, get tested at least 5 days after you last had close contact with someone with COVID-19. °Watch for symptoms °Watch for symptoms until 10 days after you last had close contact with someone with COVID-19. °If you develop symptoms °Isolate immediately and get tested. Continue to stay home until you know the results. Wear a well-fitting mask around others. °Take precautions until day 10 °Wear a well-fitting mask °Wear a well-fitting mask for 10 full days any time you are around others inside your home or in public. Do not go to places where you are unable to wear a well-fitting mask. °Take precautions if traveling °Avoid being around people who are more likely to get very sick from COVID-19. °IF YOU were exposed to COVID-19 and had confirmed COVID-19 within the past 90 days (you tested positive using a viral test) °No quarantine °You do not need to stay home unless you develop symptoms. °Watch for symptoms °Watch for symptoms until 10 days after you last had close contact with someone with COVID-19. °If you develop symptoms °Isolate immediately and get tested. Continue to stay home until you know the results. Wear a well-fitting mask around others. °Take precautions until day 10 °Wear a well-fitting mask °Wear a well-fitting mask for 10 full days any time you are around others inside your home or in public. Do not go to places where you are   unable to wear a well-fitting mask. °Take precautions if traveling °Avoid being around people who are more likely to get very sick from COVID-19. °Calculating isolation °Day 0 is your first day of symptoms or a positive viral test. Day 1 is the first full day after your symptoms developed or your test specimen was collected. If you have COVID-19 or have symptoms, isolate for at least 5 days. °IF YOU tested positive for COVID-19 or have symptoms, regardless of  vaccination status °Stay home for at least 5 days °Stay home for 5 days and isolate from others in your home. °Wear a well-fitting mask if you must be around others in your home. °Do not travel. °Ending isolation if you had symptoms °End isolation after 5 full days if you are fever-free for 24 hours (without the use of fever-reducing medication) and your symptoms are improving. °Ending isolation if you did NOT have symptoms °End isolation after at least 5 full days after your positive test. °If you got very sick from COVID-19 or have a weakened immune system °You should isolate for at least 10 days. Consult your doctor before ending isolation. °Take precautions until day 10 °Wear a well-fitting mask °Wear a well-fitting mask for 10 full days any time you are around others inside your home or in public. Do not go to places where you are unable to wear a well-fitting mask. °Do not travel °Do not travel until a full 10 days after your symptoms started or the date your positive test was taken if you had no symptoms. °Avoid being around people who are more likely to get very sick from COVID-19. °Definitions °Exposure °Contact with someone infected with SARS-CoV-2, the virus that causes COVID-19, in a way that increases the likelihood of getting infected with the virus. °Close contact °A close contact is someone who was less than 6 feet away from an infected person (laboratory-confirmed or a clinical diagnosis) for a cumulative total of 15 minutes or more over a 24-hour period. For example, three individual 5-minute exposures for a total of 15 minutes. People who are exposed to someone with COVID-19 after they completed at least 5 days of isolation are not considered close contacts. °Quarantine °Quarantine is a strategy used to prevent transmission of COVID-19 by keeping people who have been in close contact with someone with COVID-19 apart from others. °Who does not need to quarantine? °If you had close contact with  someone with COVID-19 and you are in one of the following groups, you do not need to quarantine. °You are up to date with your COVID-19 vaccines. °You had confirmed COVID-19 within the last 90 days (meaning you tested positive using a viral test). °If you are up to date with COVID-19 vaccines, you should wear a well-fitting mask around others for 10 days from the date of your last close contact with someone with COVID-19 (the date of last close contact is considered day 0). Get tested at least 5 days after you last had close contact with someone with COVID-19. If you test positive or develop COVID-19 symptoms, isolate from other people and follow recommendations in the Isolation section below. If you tested positive for COVID-19 with a viral test within the previous 90 days and subsequently recovered and remain without COVID-19 symptoms, you do not need to quarantine or get tested after close contact. You should wear a well-fitting mask around others for 10 days from the date of your last close contact with someone with COVID-19 (the date of last   close contact is considered day 0). If you have COVID-19 symptoms, get tested and isolate from other people and follow recommendations in the Isolation section below. °Who should quarantine? °If you come into close contact with someone with COVID-19, you should quarantine if you are not up to date on COVID-19 vaccines. This includes people who are not vaccinated. °What to do for quarantine °Stay home and away from other people for at least 5 days (day 0 through day 5) after your last contact with a person who has COVID-19. The date of your exposure is considered day 0. Wear a well-fitting mask when around others at home, if possible. °For 10 days after your last close contact with someone with COVID-19, watch for fever (100.4°F or greater), cough, shortness of breath, or other COVID-19 symptoms. °If you develop symptoms, get tested immediately and isolate until you receive  your test results. If you test positive, follow isolation recommendations. °If you do not develop symptoms, get tested at least 5 days after you last had close contact with someone with COVID-19. °If you test negative, you can leave your home, but continue to wear a well-fitting mask when around others at home and in public until 10 days after your last close contact with someone with COVID-19. °If you test positive, you should isolate for at least 5 days from the date of your positive test (if you do not have symptoms). If you do develop COVID-19 symptoms, isolate for at least 5 days from the date your symptoms began (the date the symptoms started is day 0). Follow recommendations in the isolation section below. °If you are unable to get a test 5 days after last close contact with someone with COVID-19, you can leave your home after day 5 if you have been without COVID-19 symptoms throughout the 5-day period. Wear a well-fitting mask for 10 days after your date of last close contact when around others at home and in public. °Avoid people who are have weakened immune systems or are more likely to get very sick from COVID-19, and nursing homes and other high-risk settings, until after at least 10 days. °If possible, stay away from people you live with, especially people who are at higher risk for getting very sick from COVID-19, as well as others outside your home throughout the full 10 days after your last close contact with someone with COVID-19. °If you are unable to quarantine, you should wear a well-fitting mask for 10 days when around others at home and in public. °If you are unable to wear a mask when around others, you should continue to quarantine for 10 days. Avoid people who have weakened immune systems or are more likely to get very sick from COVID-19, and nursing homes and other high-risk settings, until after at least 10 days. °See additional information about travel. °Do not go to places where you are  unable to wear a mask, such as restaurants and some gyms, and avoid eating around others at home and at work until after 10 days after your last close contact with someone with COVID-19. °After quarantine °Watch for symptoms until 10 days after your last close contact with someone with COVID-19. °If you have symptoms, isolate immediately and get tested. °Quarantine in high-risk congregate settings °In certain congregate settings that have high risk of secondary transmission (such as correctional and detention facilities, homeless shelters, or cruise ships), CDC recommends a 10-day quarantine for residents, regardless of vaccination and booster status. During periods of critical staffing   shortages, facilities may consider shortening the quarantine period for staff to ensure continuity of operations. Decisions to shorten quarantine in these settings should be made in consultation with state, local, tribal, or territorial health departments and should take into consideration the context and characteristics of the facility. CDC's setting-specific guidance provides additional recommendations for these settings. °Isolation °Isolation is used to separate people with confirmed or suspected COVID-19 from those without COVID-19. People who are in isolation should stay home until it's safe for them to be around others. At home, anyone sick or infected should separate from others, or wear a well-fitting mask when they need to be around others. People in isolation should stay in a specific "sick room" or area and use a separate bathroom if available. Everyone who has presumed or confirmed COVID-19 should stay home and isolate from other people for at least 5 full days (day 0 is the first day of symptoms or the date of the day of the positive viral test for asymptomatic persons). They should wear a mask when around others at home and in public for an additional 5 days. People who are confirmed to have COVID-19 or are showing  symptoms of COVID-19 need to isolate regardless of their vaccination status. This includes: °People who have a positive viral test for COVID-19, regardless of whether or not they have symptoms. °People with symptoms of COVID-19, including people who are awaiting test results or have not been tested. People with symptoms should isolate even if they do not know if they have been in close contact with someone with COVID-19. °What to do for isolation °Monitor your symptoms. If you have an emergency warning sign (including trouble breathing), seek emergency medical care immediately. °Stay in a separate room from other household members, if possible. °Use a separate bathroom, if possible. °Take steps to improve ventilation at home, if possible. °Avoid contact with other members of the household and pets. °Don't share personal household items, like cups, towels, and utensils. °Wear a well-fitting mask when you need to be around other people. °Learn more about what to do if you are sick and how to notify your contacts. °Ending isolation for people who had COVID-19 and had symptoms °If you had COVID-19 and had symptoms, isolate for at least 5 days. To calculate your 5-day isolation period, day 0 is your first day of symptoms. Day 1 is the first full day after your symptoms developed. You can leave isolation after 5 full days. °You can end isolation after 5 full days if you are fever-free for 24 hours without the use of fever-reducing medication and your other symptoms have improved (Loss of taste and smell may persist for weeks or months after recovery and need not delay the end of isolation). °You should continue to wear a well-fitting mask around others at home and in public for 5 additional days (day 6 through day 10) after the end of your 5-day isolation period. If you are unable to wear a mask when around others, you should continue to isolate for a full 10 days. Avoid people who have weakened immune systems or are more  likely to get very sick from COVID-19, and nursing homes and other high-risk settings, until after at least 10 days. °If you continue to have fever or your other symptoms have not improved after 5 days of isolation, you should wait to end your isolation until you are fever-free for 24 hours without the use of fever-reducing medication and your other symptoms have improved.   Continue to wear a well-fitting mask through day 10. Contact your healthcare provider if you have questions. °See additional information about travel. °Do not go to places where you are unable to wear a mask, such as restaurants and some gyms, and avoid eating around others at home and at work until a full 10 days after your first day of symptoms. °If an individual has access to a test and wants to test, the best approach is to use an antigen test1 towards the end of the 5-day isolation period. Collect the test sample only if you are fever-free for 24 hours without the use of fever-reducing medication and your other symptoms have improved (loss of taste and smell may persist for weeks or months after recovery and need not delay the end of isolation). If your test result is positive, you should continue to isolate until day 10. If your test result is negative, you can end isolation, but continue to wear a well-fitting mask around others at home and in public until day 10. Follow additional recommendations for masking and avoiding travel as described above. °1As noted in the labeling for authorized over-the counter antigen tests: Negative results should be treated as presumptive. Negative results do not rule out SARS-CoV-2 infection and should not be used as the sole basis for treatment or patient management decisions, including infection control decisions. To improve results, antigen tests should be used twice over a three-day period with at least 24 hours and no more than 48 hours between tests. °Note that these recommendations on ending isolation  do not apply to people who are moderately ill or very sick from COVID-19 or have weakened immune systems. See section below for recommendations for when to end isolation for these groups. °Ending isolation for people who tested positive for COVID-19 but had no symptoms °If you test positive for COVID-19 and never develop symptoms, isolate for at least 5 days. Day 0 is the day of your positive viral test (based on the date you were tested) and day 1 is the first full day after the specimen was collected for your positive test. You can leave isolation after 5 full days. °If you continue to have no symptoms, you can end isolation after at least 5 days. °You should continue to wear a well-fitting mask around others at home and in public until day 10 (day 6 through day 10). If you are unable to wear a mask when around others, you should continue to isolate for 10 days. Avoid people who have weakened immune systems or are more likely to get very sick from COVID-19, and nursing homes and other high-risk settings, until after at least 10 days. °If you develop symptoms after testing positive, your 5-day isolation period should start over. Day 0 is your first day of symptoms. Follow the recommendations above for ending isolation for people who had COVID-19 and had symptoms. °See additional information about travel. °Do not go to places where you are unable to wear a mask, such as restaurants and some gyms, and avoid eating around others at home and at work until 10 days after the day of your positive test. °If an individual has access to a test and wants to test, the best approach is to use an antigen test1 towards the end of the 5-day isolation period. If your test result is positive, you should continue to isolate until day 10. If your test result is positive, you can also choose to test daily and if your test result   is negative, you can end isolation, but continue to wear a well-fitting mask around others at home and in  public until day 10. Follow additional recommendations for masking and avoiding travel as described above. °1As noted in the labeling for authorized over-the counter antigen tests: Negative results should be treated as presumptive. Negative results do not rule out SARS-CoV-2 infection and should not be used as the sole basis for treatment or patient management decisions, including infection control decisions. To improve results, antigen tests should be used twice over a three-day period with at least 24 hours and no more than 48 hours between tests. °Ending isolation for people who were moderately or very sick from COVID-19 or have a weakened immune system °People who are moderately ill from COVID-19 (experiencing symptoms that affect the lungs like shortness of breath or difficulty breathing) should isolate for 10 days and follow all other isolation precautions. To calculate your 10-day isolation period, day 0 is your first day of symptoms. Day 1 is the first full day after your symptoms developed. If you are unsure if your symptoms are moderate, talk to a healthcare provider for further guidance. °People who are very sick from COVID-19 (this means people who were hospitalized or required intensive care or ventilation support) and people who have weakened immune systems might need to isolate at home longer. They may also require testing with a viral test to determine when they can be around others. CDC recommends an isolation period of at least 10 and up to 20 days for people who were very sick from COVID-19 and for people with weakened immune systems. Consult with your healthcare provider about when you can resume being around other people. If you are unsure if your symptoms are severe or if you have a weakened immune system, talk to a healthcare provider for further guidance. °People who have a weakened immune system should talk to their healthcare provider about the potential for reduced immune responses to  COVID-19 vaccines and the need to continue to follow current prevention measures (including wearing a well-fitting mask and avoiding crowds and poorly ventilated indoor spaces) to protect themselves against COVID-19 until advised otherwise by their healthcare provider. Close contacts of immunocompromised people--including household members--should also be encouraged to receive all recommended COVID-19 vaccine doses to help protect these people. °Isolation in high-risk congregate settings °In certain high-risk congregate settings that have high risk of secondary transmission and where it is not feasible to cohort people (such as correctional and detention facilities, homeless shelters, and cruise ships), CDC recommends a 10-day isolation period for residents. During periods of critical staffing shortages, facilities may consider shortening the isolation period for staff to ensure continuity of operations. Decisions to shorten isolation in these settings should be made in consultation with state, local, tribal, or territorial health departments and should take into consideration the context and characteristics of the facility. CDC's setting-specific guidance provides additional recommendations for these settings. °This CDC guidance is meant to supplement--not replace--any federal, state, local, territorial, or tribal health and safety laws, rules, and regulations. °Recommendations for specific settings °These recommendations do not apply to healthcare professionals. For guidance specific to these settings, see °Healthcare professionals: Interim Guidance for Managing Healthcare Personnel with SARS-CoV-2 Infection or Exposure to SARS-CoV-2 °Patients, residents, and visitors to healthcare settings: Interim Infection Prevention and Control Recommendations for Healthcare Personnel During the Coronavirus Disease 2019 (COVID-19) Pandemic °Additional setting-specific guidance and recommendations are available. °These  recommendations on quarantine and isolation do apply to K[employer] School   settings. Additional guidance is available here: Overview of COVID-19 Quarantine for K-12 Schools °Travelers: Travel information and recommendations °Congregate facilities and other settings: guidance pages for community, work, and school settings °Ongoing COVID-19 exposure FAQs °I live with someone with COVID-19, but I cannot be separated from them. How do we manage quarantine in this situation? °It is very important for people with COVID-19 to remain apart from other people, if possible, even if they are living together. If separation of the person with COVID-19 from others that they live with is not possible, the other people that they live with will have ongoing exposure, meaning they will be repeatedly exposed until that person is no longer able to spread the virus to other people. In this situation, there are precautions you can take to limit the spread of COVID-19: °The person with COVID-19 and everyone they live with should wear a well-fitting mask inside the home. °If possible, one person should care for the person with COVID-19 to limit the number of people who are in close contact with the infected person. °Take steps to protect yourself and others to reduce transmission in the home: °Quarantine if you are not up to date with your COVID-19 vaccines. °Isolate if you are sick or tested positive for COVID-19, even if you don't have symptoms. °Learn more about the public health recommendations for testing, mask use and quarantine of close contacts, like yourself, who have ongoing exposure. These recommendations differ depending on your vaccination status. °What should I do if I have ongoing exposure to COVID-19 from someone I live with? °Recommendations for this situation depend on your vaccination status: °If you are not up to date on COVID-19 vaccines and have ongoing exposure to COVID-19, you should: °Begin quarantine immediately and  continue to quarantine throughout the isolation period of the person with COVID-19. °Continue to quarantine for an additional 5 days starting the day after the end of isolation for the person with COVID-19. °Get tested at least 5 days after the end of isolation of the infected person that lives with them. °If you test negative, you can leave the home but should continue to wear a well-fitting mask when around others at home and in public until 10 days after the end of isolation for the person with COVID-19. °Isolate immediately if you develop symptoms of COVID-19 or test positive. °If you are up to date with COVID-19 vaccines and have ongoing exposure to COVID-19, you should: °Get tested at least 5 days after your first exposure. A person with COVID-19 is considered infectious starting 2 days before they develop symptoms, or 2 days before the date of their positive test if they do not have symptoms. °Get tested again at least 5 days after the end of isolation for the person with COVID-19. °Wear a well-fitting mask when you are around the person with COVID-19, and do this throughout their isolation period. °Wear a well-fitting mask around others for 10 days after the infected person's isolation period ends. °Isolate immediately if you develop symptoms of COVID-19 or test positive. °What should I do if multiple people I live with test positive for COVID-19 at different times? °Recommendations for this situation depend on your vaccination status: °If you are not up to date with your COVID-19 vaccines, you should: °Quarantine throughout the isolation period of any infected person that you live with. °Continue to quarantine until 5 days after the end of isolation date for the most recently infected person that lives with you. For example, if   the last day of isolation of the person most recently infected with COVID-19 was June 30, the new 5-day quarantine period starts on July 1. °Get tested at least 5 days after the end  of isolation for the most recently infected person that lives with you. °Wear a well-fitting mask when you are around any person with COVID-19 while that person is in isolation. °Wear a well-fitting mask when you are around other people until 10 days after your last close contact. °Isolate immediately if you develop symptoms of COVID-19 or test positive. °If you are up to date with your COVID-19 vaccines, you should: °Get tested at least 5 days after your first exposure. A person with COVID-19 is considered infectious starting 2 days before they developed symptoms, or 2 days before the date of their positive test if they do not have symptoms. °Get tested again at least 5 days after the end of isolation for the most recently infected person that lives with you. °Wear a well-fitting mask when you are around any person with COVID-19 while that person is in isolation. °Wear a well-fitting mask around others for 10 days after the end of isolation for the most recently infected person that lives with you. For example, if the last day of isolation for the person most recently infected with COVID-19 was June 30, the new 10-day period to wear a well-fitting mask indoors in public starts on July 1. °Isolate immediately if you develop symptoms of COVID-19 or test positive. °I had COVID-19 and completed isolation. Do I have to quarantine or get tested if someone I live with gets COVID-19 shortly after I completed isolation? °No. If you recently completed isolation and someone that lives with you tests positive for the virus that causes COVID-19 shortly after the end of your isolation period, you do not have to quarantine or get tested as long as you do not develop new symptoms. Once all of the people that live together have completed isolation or quarantine, refer to the guidance below for new exposures to COVID-19. °If you had COVID-19 in the previous 90 days and then came into close contact with someone with COVID-19, you do  not have to quarantine or get tested if you do not have symptoms. But you should: °Wear a well-fitting mask indoors in public for 10 days after your last close contact. °Monitor for COVID-19 symptoms for 10 days from the date of your last close contact. °Isolate immediately and get tested if symptoms develop. °If more than 90 days have passed since your recovery from infection, follow CDC's recommendations for close contacts. These recommendations will differ depending on your vaccination status. °03/21/2021 °Content source: National Center for Immunization and Respiratory Diseases (NCIRD), Division of Viral Diseases °This information is not intended to replace advice given to you by your health care provider. Make sure you discuss any questions you have with your health care provider. °Document Revised: 07/25/2021 Document Reviewed: 07/25/2021 °Elsevier Patient Education © 2022 Elsevier Inc. ° °

## 2022-07-24 NOTE — Progress Notes (Signed)
Virtual Visit via Video   This visit type was conducted due to national recommendations for restrictions regarding the COVID-19 Pandemic (e.g. social distancing) in an effort to limit this patient's exposure and mitigate transmission in our community.  Due to her co-morbid illnesses, this patient is at least at moderate risk for complications without adequate follow up.  This format is felt to be most appropriate for this patient at this time.  All issues noted in this document were discussed and addressed.  A limited physical exam was performed with this format.    This visit type was conducted due to national recommendations for restrictions regarding the COVID-19 Pandemic (e.g. social distancing) in an effort to limit this patient's exposure and mitigate transmission in our community.  Patients identity confirmed using two different identifiers.  This format is felt to be most appropriate for this patient at this time.  All issues noted in this document were discussed and addressed.  No physical exam was performed (except for noted visual exam findings with Video Visits).    Date:  08/05/2022   ID:  Christine Mcdowell, DOB 1971-12-13, MRN [id number]  Patient Location:  [facility], accompanied by her Mom  Provider location:   Office    Chief Complaint:  "I have COVID"  History of Present Illness:    Christine Mcdowell is a 51 y.o. female who presents via video conferencing for a telehealth visit today.    The patient does have symptoms concerning for COVID-19 infection (fever, chills, cough, or new shortness of breath).   She presents today for virtual visit. She prefers this method of contact due to COVID-19 pandemic. She presents today for treatment of COVID. Sunday night/Monday morning she started to experience a cough/congestion. Yesterday, she coughed up green phlegm. She reports she was recently hospitalized for knee revision surgery last week. She denies known ill contacts.  Denies fever/chills. Has had some body aches.        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   Discoid lupus    Fibromyalgia    History of blood transfusion "several"   "related to anemia; had some w/hysterectomy also"   Hx of cardiovascular stress test    ETT-Myoview (9/15):  No ischemia, EF 52%; NORMAL   Hx of echocardiogram    Echo (9/15):  EF 50-55%, ant HK, Gr 1 DD, mild MR, mild LAE, no effusion   Hypertension    Iron deficiency anemia    h/o iron transfusions   Lupus (systemic lupus erythematosus) ([place])    Migraine    "a few/year" (07/03/2016)   Pneumonia 12/2015   RA (rheumatoid arthritis) ([place])    "all over" (07/03/2016)   Sickle cell trait ([place])    Stroke ([place]) 2014 X 1; 2015 X 2; 2016 X 1;    "right side of face more relaxed than the other; rare speech hesitation" (07/03/2016)   Vaginal Pap smear, abnormal    ASCUS; HPV   Past Surgical History:  Procedure Laterality Date   ABDOMINAL HYSTERECTOMY  2009   ABDOMINAL WOUND DEHISCENCE  2009   BUNIONECTOMY Left 06/15/2020   CARDIAC CATHETERIZATION N/A 10/11/2016   Procedure: Right/Left Heart Cath and Coronary Angiography;  Surgeon:  Jolaine Artist, MD;  Location: [facility];  Service: Cardiovascular;  Laterality: N/A;   DILATION AND CURETTAGE OF UTERUS  1991   HEMATOMA EVACUATION  2009   abdomen   ICD IMPLANT  01/28/2018   ICD IMPLANT N/A 01/28/2018   Procedure: ICD IMPLANT;  Surgeon: Evans Lance, MD;  Location: [facility];  Service: Cardiovascular;  Laterality: N/A;   INCISE AND DRAIN ABCESS  2009 X 2   "abdomen after hysterectomy"   KNEE ARTHROSCOPY Right 1997   KNEE SURGERY Right    TUBAL LIGATION  1996     Current Meds  Medication Sig    aspirin 81 MG chewable tablet Chew 162 mg by mouth daily. Taking 2 tablets   atorvastatin (LIPITOR) 10 MG tablet Take 1 tablet (10 mg total) by mouth daily.   carvedilol (COREG) 25 MG tablet Take 1 tablet (25 mg total) by mouth 2 (two) times daily with a meal.   cetirizine (ZYRTEC ALLERGY) 10 MG tablet Take 1 tablet (10 mg total) by mouth daily.   Continuous Blood Gluc Receiver (DEXCOM G6 RECEIVER) DEVI 1 Device by Does not apply route 3 (three) times daily before meals.   Continuous Blood Gluc Sensor (DEXCOM G6 SENSOR) MISC Inject 1 each into the skin 3 (three) times daily.   Continuous Blood Gluc Sensor (DEXCOM G7 SENSOR) MISC Use to check blood sugar 3 times day dx code e11.65   Continuous Blood Gluc Transmit (DEXCOM G6 TRANSMITTER) MISC 1 Device by Does not apply route 3 (three) times daily before meals.   diclofenac Sodium (VOLTAREN) 1 % GEL apply A SMALL AMOUNT TO involved joints UP TO TWICE DAILY   folic acid (FOLVITE) 1 MG tablet TAKE 1 TABLET BY MOUTH EVERY MORNING   furosemide (LASIX) 40 MG tablet Take by mouth.   Glucagon (GVOKE HYPOPEN 2-PACK) 1 MG/0.2ML SOAJ Inject 1 mg into the skin as needed.   hydrALAZINE (APRESOLINE) 25 MG tablet Take 0.5 tablets (12.5 mg total) by mouth 3 (three) times daily.   HYDROcodone bit-homatropine (HYDROMET) 5-1.5 MG/5ML syrup Take 5 mLs by mouth every 6 (six) hours as needed for cough.   hydroxychloroquine (PLAQUENIL) 200 MG tablet Take 1 tablet (200 mg total) by mouth 2 (two) times daily.   insulin degludec (TRESIBA FLEXTOUCH) 100 UNIT/ML FlexTouch Pen Inject 10 Units into the skin daily. (Patient taking differently: Inject 15 Units into the skin at bedtime.)   insulin lispro (HUMALOG KWIKPEN) 100 UNIT/ML KwikPen Use according to sliding scale   Insulin Pen Needle (NOVOFINE PLUS PEN NEEDLE) 32G X 4 MM MISC Use with insulin pens dx code e11.65   Ipratropium-Albuterol (COMBIVENT RESPIMAT) 20-100 MCG/ACT AERS respimat Inhale 1 puff into the lungs every 6  (six) hours.   ipratropium-albuterol (DUONEB) 0.5-2.5 (3) MG/3ML SOLN Take 3 mLs by nebulization every 6 (six) hours as needed.   Lactobacillus (ACIDOPHILUS) CAPS capsule Take 1 capsule by mouth daily.   Magnesium 400 MG TABS Take 1 tablet by mouth daily.   metFORMIN (GLUCOPHAGE) 500 MG tablet Take 1 tablet (500 mg total) by mouth 2 (two) times daily.   methotrexate (RHEUMATREX) 2.5 MG tablet Take 20 mg by mouth every Friday.    metolazone (ZAROXOLYN) 2.5 MG tablet TAKE 1 TABLET BY MOUTH AS NEEDED FOR WEIGHT GAIN OF 5 POUNDS WITHIN 3 DAYS AS DIRECTED needs appt for further refills   [EXPIRED] molnupiravir EUA (LAGEVRIO) 200 MG CAPS capsule Take 4 capsules (800 mg total) by mouth 2 (two)  times daily for 5 days.   Multiple Vitamin (MULTIVITAMIN WITH MINERALS) TABS Take 1 tablet by mouth every morning.    OLUMIANT tablet Take 2 mg by mouth daily.   ondansetron (ZOFRAN-ODT) 8 MG disintegrating tablet Take 1 tablet (8 mg total) by mouth every 8 (eight) hours as needed for nausea or vomiting.   PARoxetine (PAXIL) 10 MG tablet Take 1 tablet (10 mg total) by mouth every morning.   potassium chloride (KLOR-CON M) 10 MEQ tablet TAKE THREE TABLETS BY MOUTH TWICE DAILY   predniSONE (DELTASONE) 10 MG tablet Take 1 tablet (10 mg total) by mouth daily. Tapering down to 5 mg every 7 days (Patient taking differently: Take 5 mg by mouth daily.)   PROAIR HFA 108 (90 Base) MCG/ACT inhaler INHALE 2 PUFFS BY MOUTH EVERY DAY AS NEEDED FOR SHORTNESS OF BREATH   sacubitril-valsartan (ENTRESTO) 97-103 MG Take 1 tablet by mouth 2 (two) times daily.   spironolactone (ALDACTONE) 25 MG tablet TAKE ONE TABLET BY MOUTH EVERY MORNING and TAKE ONE TABLET BY MOUTH EVERY EVENING   torsemide (DEMADEX) 20 MG tablet TAKE TWO TABLETS BY MOUTH ONCE DAILY   traZODone (DESYREL) 50 MG tablet Take 50 mg by mouth at bedtime as needed.   Vitamin D, Ergocalciferol, (DRISDOL) 1.25 MG (50000 UNIT) CAPS capsule Take 1 capsule (50,000 Units total)  by mouth 2 (two) times a week.     Allergies:   Hydrocodone-acetaminophen, Hydromorphone, Iodinated contrast media, Other, Erythromycin, Latex, Rinvoq [upadacitinib], Farxiga [dapagliflozin], Tape, and Mircette [desogestrel-ethinyl estradiol]   Social History   Tobacco Use   Smoking status: Never   Smokeless tobacco: Never  Vaping Use   Vaping Use: Never used  Substance Use Topics   Alcohol use: No   Drug use: No     Family Hx: The patient's family history includes Allergies in her father and mother; Arthritis in her mother; Asthma in her maternal grandmother; Cancer in her paternal grandfather; Cushing syndrome in her father; Dementia in her paternal grandmother; Depression in her father; Diabetes in her maternal grandmother; Drug abuse in her mother; Heart attack in her father and maternal grandfather; Heart murmur in her mother; Hypertension in her maternal grandmother. There is no history of Breast cancer.  ROS:   Please see the history of present illness.    Review of Systems  Constitutional:  Positive for malaise/fatigue.  HENT:  Positive for congestion.   Respiratory:  Positive for cough.   Cardiovascular: Negative.   Gastrointestinal: Negative.   Neurological: Negative.   Psychiatric/Behavioral: Negative.      All other systems reviewed and are negative.   Labs/Other Tests and Data Reviewed:    Recent Labs: 03/05/2022: B Natriuretic Peptide 19.9 03/08/2022: Magnesium 1.7 03/21/2022: ALT 10; BUN 15; Creatinine 1.01; Potassium 3.6; Sodium 142 06/20/2022: Hemoglobin 10.1; Platelet Count 272   Recent Lipid Panel Lab Results  Component Value Date/Time   CHOL 188 02/26/2022 03:48 PM   TRIG 108 02/26/2022 03:48 PM   HDL 48 02/26/2022 03:48 PM   CHOLHDL 3.9 02/26/2022 03:48 PM   LDLCALC 120 (H) 02/26/2022 03:48 PM    Wt Readings from Last 3 Encounters:  07/24/22 192 lb (87.1 kg)  07/02/22 187 lb (84.8 kg)  06/20/22 185 lb 8 oz (84.1 kg)     Exam:    Vital  Signs:  Ht '5\' 7"'$  (1.702 m)   Wt 192 lb (87.1 kg)   BMI 30.07 kg/m    BP Readings from Last 3 Encounters:  07/02/22  120/86  06/20/22 (!) 151/89  03/21/22 (!) 128/91    Physical Exam Vitals and nursing note reviewed.  Constitutional:      Appearance: Normal appearance.  HENT:     Head: Normocephalic and atraumatic.     Nose:     Comments: Masked     Mouth/Throat:     Comments: Masked  Eyes:     Extraocular Movements: Extraocular movements intact.  Pulmonary:     Effort: Pulmonary effort is normal.     Comments: Coughs during visit Musculoskeletal:     Cervical back: Normal range of motion.  Neurological:     Mental Status: She is alert and oriented to person, place, and time.  Psychiatric:        Mood and Affect: Affect normal.     ASSESSMENT & PLAN:    1. COVID-19 Pt is reminded to wear masks when around others. She requests treatment, rx Lagevrio was sent to her local pharmacy. Possible side effects discussed with patient. She was advised patient to take Vitamin C, D, Zinc.  Keep yourself hydrated with a lot of water and rest. Take Delsym for cough and Mucinex as needed. Take Tylenol or pain reliever every 4-6 hours as needed for pain/fever/body ache. If you have elevated blood pressure, you can take OTC Coricidin. You can also take OTC Oscillococcinum, a homeopathic remedy,  to help with your symptoms.  Educated patient that if symptoms get worse or if he/she experiences any SOB, chest pain or pain in their legs to seek immediate emergency care. Continue to monitor your oxygen levels. Call office ASAP if you have any questions. Quarantine for 5 days if tested positive and no symptoms or 10 days if tested positive and you are with symptoms. Wear a mask around other people.   2. Pure hypercholesterolemia Comments: She is on statin therapy, therefore Paxlovid is contraindicated.   3. Systemic lupus erythematosus, unspecified SLE type, unspecified organ involvement status  ([place]) Due to underlying chronic illnesses, she would benefit from COVID directed treatment. All questions were answered to her satisfaction. Encouraged to go to ER if she develops worsening SOB, advised to walk around her home as much as possible.     COVID-19 Education: The signs and symptoms of COVID-19 were discussed with the patient and how to seek care for testing (follow up with PCP or arrange E-visit).  The importance of social distancing was discussed today.  Patient Risk:   After full review of this patients clinical status, I feel that they are at least moderate risk at this time.  Time:   Today, I have spent 11 minutes with the patient with telehealth technology discussing above diagnoses.     Medication Adjustments/Labs and Tests Ordered: Current medicines are reviewed at length with the patient today.  Concerns regarding medicines are outlined above.   Tests Ordered: No orders of the defined types were placed in this encounter.   Medication Changes: Meds ordered this encounter  Medications   HYDROcodone bit-homatropine (HYDROMET) 5-1.5 MG/5ML syrup    Sig: Take 5 mLs by mouth every 6 (six) hours as needed for cough.    Dispense:  120 mL    Refill:  0    Pt states she has tolerated this in the past   cetirizine (ZYRTEC ALLERGY) 10 MG tablet    Sig: Take 1 tablet (10 mg total) by mouth daily.    Dispense:  30 tablet    Refill:  0   molnupiravir EUA (LAGEVRIO)  200 MG CAPS capsule    Sig: Take 4 capsules (800 mg total) by mouth 2 (two) times daily for 5 days.    Dispense:  40 capsule    Refill:  0    Disposition:  Follow up prn  Signed, Maximino Greenland, MD

## 2022-07-30 DIAGNOSIS — Z96651 Presence of right artificial knee joint: Secondary | ICD-10-CM | POA: Diagnosis not present

## 2022-07-30 DIAGNOSIS — Z789 Other specified health status: Secondary | ICD-10-CM | POA: Diagnosis not present

## 2022-07-30 DIAGNOSIS — R29898 Other symptoms and signs involving the musculoskeletal system: Secondary | ICD-10-CM | POA: Diagnosis not present

## 2022-07-30 DIAGNOSIS — T8484XD Pain due to internal orthopedic prosthetic devices, implants and grafts, subsequent encounter: Secondary | ICD-10-CM | POA: Diagnosis not present

## 2022-07-30 DIAGNOSIS — Z7409 Other reduced mobility: Secondary | ICD-10-CM | POA: Diagnosis not present

## 2022-08-01 DIAGNOSIS — Z96651 Presence of right artificial knee joint: Secondary | ICD-10-CM | POA: Diagnosis not present

## 2022-08-01 DIAGNOSIS — R29898 Other symptoms and signs involving the musculoskeletal system: Secondary | ICD-10-CM | POA: Diagnosis not present

## 2022-08-01 DIAGNOSIS — Z789 Other specified health status: Secondary | ICD-10-CM | POA: Diagnosis not present

## 2022-08-01 DIAGNOSIS — Z7409 Other reduced mobility: Secondary | ICD-10-CM | POA: Diagnosis not present

## 2022-08-01 DIAGNOSIS — T8484XD Pain due to internal orthopedic prosthetic devices, implants and grafts, subsequent encounter: Secondary | ICD-10-CM | POA: Diagnosis not present

## 2022-08-01 DIAGNOSIS — Z471 Aftercare following joint replacement surgery: Secondary | ICD-10-CM | POA: Diagnosis not present

## 2022-08-07 ENCOUNTER — Ambulatory Visit: Payer: Self-pay

## 2022-08-07 NOTE — Patient Instructions (Signed)
Visit Information  Thank you for taking time to visit with me today. Please don't hesitate to contact me if I can be of assistance to you.   Following are the goals we discussed today:   Goals Addressed       Patient Stated     To recover from [place] 19 (pt-stated)        Care Coordination Interventions: Provided education to patient to enhance basic understanding of COVID-19 as a viral disease, measures to prevent exposure, signs and symptoms, recommended vaccine schedule, when to contact provider Advised patient to discuss recommendations for use of MTX during COVID infection with provider      Our next appointment is by telephone on 08/23/22 at 11:30 AM   Please call the care guide team at [phone] if you need to cancel or reschedule your appointment.   If you are experiencing a Mental Health or [place] or need someone to talk to, please call [phone]-TALK (toll free, 24 hour hotline)  Patient verbalizes understanding of instructions and care plan provided today and agrees to view in [facility]. Active MyChart status and patient understanding of how to access instructions and care plan via MyChart confirmed with patient.     Barb Merino, RN, BSN, CCM Care Management Coordinator [facility] Care Management Direct Phone: [phone]

## 2022-08-07 NOTE — Patient Outreach (Signed)
  Care Coordination   Follow Up Visit Note   08/07/2022 Name: Christine Mcdowell MRN: [id number] DOB: 11-25-1971  Christine Mcdowell is a 52 y.o. year old female who sees [name], [place] for primary care. I spoke with  Christine Mcdowell by phone today  What matters to the patients health and wellness today?  Patient would like to make a full recovery from COVID 19.    Goals Addressed       Patient Stated     To recover from [place] 28 (pt-stated)        Care Coordination Interventions: Provided education to patient to enhance basic understanding of COVID-19 as a viral disease, measures to prevent exposure, signs and symptoms, recommended vaccine schedule, when to contact provider Advised patient to discuss recommendations for use of MTX during COVID infection with provider      SDOH assessments and interventions completed:  Yes   Care Coordination Interventions Activated:  Yes  Care Coordination Interventions:  Yes, provided   Follow up plan: Follow up call scheduled for 08/23/22 '@11'$ :30 AM     Encounter Outcome:  Pt. Visit Completed

## 2022-08-08 DIAGNOSIS — R29898 Other symptoms and signs involving the musculoskeletal system: Secondary | ICD-10-CM | POA: Diagnosis not present

## 2022-08-08 DIAGNOSIS — Z789 Other specified health status: Secondary | ICD-10-CM | POA: Diagnosis not present

## 2022-08-08 DIAGNOSIS — T8484XD Pain due to internal orthopedic prosthetic devices, implants and grafts, subsequent encounter: Secondary | ICD-10-CM | POA: Diagnosis not present

## 2022-08-08 DIAGNOSIS — Z96651 Presence of right artificial knee joint: Secondary | ICD-10-CM | POA: Diagnosis not present

## 2022-08-08 DIAGNOSIS — Z7409 Other reduced mobility: Secondary | ICD-10-CM | POA: Diagnosis not present

## 2022-08-11 DIAGNOSIS — E1165 Type 2 diabetes mellitus with hyperglycemia: Secondary | ICD-10-CM | POA: Diagnosis not present

## 2022-08-12 ENCOUNTER — Telehealth: Payer: Self-pay

## 2022-08-12 NOTE — Chronic Care Management (AMB) (Signed)
Chronic Care Management Pharmacy Assistant   Name: Christine Mcdowell  MRN: [id number] DOB: 1971-08-17  Reason for Encounter: Medication Review/ Medication coordination  Recent office visits:  08-07-2022 [place], [name], RN (CCM).  07-24-2022 [name], MD. Video visit for covid symptoms. START Zyrtec 10 mg daily, hydromet 5 mls every 6 hours as needed and lagevrio 800 mg twice daily.  Recent consult visits:  08-01-2022 Benjaman Lobe, PA-C (Ortho surgery). Post op visit.  08-01-2022 Sheron Nightingale (PT). Visit for physical therapy.  07-30-2022 Sheron Nightingale (PT). Visit for physical therapy.  07-23-2022 [place], Memory Dance, PT. Visit for physical therapy.  07-19-2022 Sheron Nightingale, PT. Visit for physical therapy. Total knee revision.  07-16-2022 Louann Liv, MD (Orthopedic surgery). Total knee revision.  Hospital visits:  Medication Reconciliation was completed by comparing discharge summary, patient's EMR and Pharmacy list, and upon discussion with patient.   Admitted to the hospital on 03-05-2022 due to  Chest pain. Discharge date was 03-05-2022. Discharged from [place]?Medications Started at [facility] Discharge:?? None   Medication Changes at Hospital Discharge: None   Medications Discontinued at Hospital Discharge: None   Medications that remain the same after Hospital Discharge:??  -All other medications will remain the same.     Hospital visits:  Medication Reconciliation was completed by comparing discharge summary, patient's EMR and Pharmacy list, and upon discussion with patient.   Admitted to the hospital on 02-25-2022 due to Shortness of breath. Discharge date was 02-25-2022. Discharged from [place]?Medications Started at [facility] Discharge:?? None   Medication Changes at Hospital Discharge: None   Medications Discontinued at Hospital  Discharge: None   Medications that remain the same after Hospital Discharge:??  -All other medications will remain the same.      Medications: Outpatient Encounter Medications as of 08/12/2022  Medication Sig   aspirin 81 MG chewable tablet Chew 162 mg by mouth daily. Taking 2 tablets   atorvastatin (LIPITOR) 10 MG tablet Take 1 tablet (10 mg total) by mouth daily.   carvedilol (COREG) 25 MG tablet Take 1 tablet (25 mg total) by mouth 2 (two) times daily with a meal.   cetirizine (ZYRTEC ALLERGY) 10 MG tablet Take 1 tablet (10 mg total) by mouth daily.   Continuous Blood Gluc Receiver (DEXCOM G6 RECEIVER) DEVI 1 Device by Does not apply route 3 (three) times daily before meals.   Continuous Blood Gluc Sensor (DEXCOM G6 SENSOR) MISC Inject 1 each into the skin 3 (three) times daily.   Continuous Blood Gluc Sensor (DEXCOM G7 SENSOR) MISC Use to check blood sugar 3 times day dx code e11.65   Continuous Blood Gluc Transmit (DEXCOM G6 TRANSMITTER) MISC 1 Device by Does not apply route 3 (three) times daily before meals.   diclofenac Sodium (VOLTAREN) 1 % GEL apply A SMALL AMOUNT TO involved joints UP TO TWICE DAILY   folic acid (FOLVITE) 1 MG tablet TAKE 1 TABLET BY MOUTH EVERY MORNING   furosemide (LASIX) 40 MG tablet Take by mouth.   Glucagon (GVOKE HYPOPEN 2-PACK) 1 MG/0.2ML SOAJ Inject 1 mg into the skin as needed.   hydrALAZINE (APRESOLINE) 25 MG tablet Take 0.5 tablets (12.5 mg total) by mouth 3 (three) times daily.   HYDROcodone bit-homatropine (HYDROMET) 5-1.5 MG/5ML syrup Take 5 mLs by mouth every 6 (six) hours as needed for cough.   hydroxychloroquine (PLAQUENIL) 200 MG tablet Take 1 tablet (  200 mg total) by mouth 2 (two) times daily.   insulin degludec (TRESIBA FLEXTOUCH) 100 UNIT/ML FlexTouch Pen Inject 10 Units into the skin daily. (Patient taking differently: Inject 15 Units into the skin at bedtime.)   insulin lispro (HUMALOG KWIKPEN) 100 UNIT/ML KwikPen Use according to sliding  scale   Insulin Pen Needle (NOVOFINE PLUS PEN NEEDLE) 32G X 4 MM MISC Use with insulin pens dx code e11.65   Ipratropium-Albuterol (COMBIVENT RESPIMAT) 20-100 MCG/ACT AERS respimat Inhale 1 puff into the lungs every 6 (six) hours.   ipratropium-albuterol (DUONEB) 0.5-2.5 (3) MG/3ML SOLN Take 3 mLs by nebulization every 6 (six) hours as needed.   Lactobacillus (ACIDOPHILUS) CAPS capsule Take 1 capsule by mouth daily.   Magnesium 400 MG TABS Take 1 tablet by mouth daily.   metFORMIN (GLUCOPHAGE) 500 MG tablet Take 1 tablet (500 mg total) by mouth 2 (two) times daily.   methotrexate (RHEUMATREX) 2.5 MG tablet Take 20 mg by mouth every Friday.    metolazone (ZAROXOLYN) 2.5 MG tablet TAKE 1 TABLET BY MOUTH AS NEEDED FOR WEIGHT GAIN OF 5 POUNDS WITHIN 3 DAYS AS DIRECTED needs appt for further refills   Multiple Vitamin (MULTIVITAMIN WITH MINERALS) TABS Take 1 tablet by mouth every morning.    OLUMIANT tablet Take 2 mg by mouth daily.   ondansetron (ZOFRAN-ODT) 8 MG disintegrating tablet Take 1 tablet (8 mg total) by mouth every 8 (eight) hours as needed for nausea or vomiting.   PARoxetine (PAXIL) 10 MG tablet Take 1 tablet (10 mg total) by mouth every morning.   potassium chloride (KLOR-CON M) 10 MEQ tablet TAKE THREE TABLETS BY MOUTH TWICE DAILY   predniSONE (DELTASONE) 10 MG tablet Take 1 tablet (10 mg total) by mouth daily. Tapering down to 5 mg every 7 days (Patient taking differently: Take 5 mg by mouth daily.)   PROAIR HFA 108 (90 Base) MCG/ACT inhaler INHALE 2 PUFFS BY MOUTH EVERY DAY AS NEEDED FOR SHORTNESS OF BREATH   sacubitril-valsartan (ENTRESTO) 97-103 MG Take 1 tablet by mouth 2 (two) times daily.   spironolactone (ALDACTONE) 25 MG tablet TAKE ONE TABLET BY MOUTH EVERY MORNING and TAKE ONE TABLET BY MOUTH EVERY EVENING   torsemide (DEMADEX) 20 MG tablet TAKE TWO TABLETS BY MOUTH ONCE DAILY   traZODone (DESYREL) 50 MG tablet Take 50 mg by mouth at bedtime as needed.   Vitamin D,  Ergocalciferol, (DRISDOL) 1.25 MG (50000 UNIT) CAPS capsule Take 1 capsule (50,000 Units total) by mouth 2 (two) times a week.   No facility-administered encounter medications on file as of 08/12/2022.  Reviewed chart for medication changes ahead of medication coordination call.  No medication changes indicated  BP Readings from Last 3 Encounters:  07/02/22 120/86  06/20/22 (!) 151/89  03/21/22 (!) 128/91    Lab Results  Component Value Date   HGBA1C 7.0 (H) 02/26/2022     Patient obtains medications through Vials  30 Days   Last adherence delivery included:  Insulin Lispro (Humalog)- use according to sliding scale- Max dose 15 units Entresto 97 mg/103 mg- one tablet twice a day TruePlus Pen Needles 32 G-use as directed Vitamin D 50,000 units- one capsules two times a week, Tuesday and Friday Atorvastatin 10 mg- one tablet daily Folic acid 1 mg- daily Tresiba 100 u/ml- Inject 10 units into skin daily Spironolactone 25 mg- one tablet twice a day Methotrexate 2.5 mg- 8 tabs every Friday Trazodone 50 mg- 1 tablet at bedtime as needed for sleep Carvedilol 25  mg- One tablet twice a day Metformin 500 mg- one tablet twice a day Potassium CL ER 10 Meq- 3 tabs twice daily Torsemide 20 mg twice daily   Patient declined (meds) last month: None  Patient is due for next adherence delivery on: 08-21-2022  Called patient and reviewed medications and coordinated delivery.  This delivery to include: Entresto 97 mg/103 mg- one tablet twice a day TruePlus Pen Needles 32 G-use as directed Vitamin D 50,000 units- one capsules two times a week, Tuesday and Friday Folic acid 1 mg- daily Spironolactone 25 mg- one tablet twice a day Methotrexate 2.5 mg- 8 tabs every Friday Trazodone 50 mg- 1 tablet at bedtime as needed for sleep Carvedilol 25 mg- One tablet twice a day Metformin 500 mg- one tablet twice a day Potassium CL ER 10 Meq- 3 tabs twice daily Torsemide 20 mg twice daily  No  acute/short fill needed  Patient declined the following medications: Tresiba- Filled on 07-10-2022 75 DS Atorvastatin- Filled on 07-10-2022 90 DS Humalog- Patient hasn't seen PCP yet to determine dosing. Patient stated she has been doing ok without medication. BS readings 08-21 fasting 108, 08-20 fasting 97, 228 after dinner.  Patient needs refills for: Sent to PCP Vitamin D2 Potassium CL ER   Confirmed delivery date of 08-21-2022 advised patient that pharmacy will contact them the morning of delivery.  Care Gaps: Yearly ophthalmology exam overdue Covid booster overdue Flu vaccine overdue AWV 10-03-2022  Star Rating Drugs: Entresto 97-103 mg- Last filled 07-15-2022 30 DS upstream Metformin 500 mg- Last filled 07-15-2022 30 DS upstream Atorvastatin 10 mg- Last filled 07-10-2022 90 DS upstream  [place] Pharmacist Assistant [phone]

## 2022-08-15 ENCOUNTER — Telehealth: Payer: Self-pay

## 2022-08-15 ENCOUNTER — Other Ambulatory Visit: Payer: Self-pay

## 2022-08-15 DIAGNOSIS — Z7409 Other reduced mobility: Secondary | ICD-10-CM | POA: Diagnosis not present

## 2022-08-15 DIAGNOSIS — Z789 Other specified health status: Secondary | ICD-10-CM | POA: Diagnosis not present

## 2022-08-15 DIAGNOSIS — Z96651 Presence of right artificial knee joint: Secondary | ICD-10-CM | POA: Diagnosis not present

## 2022-08-15 DIAGNOSIS — R29898 Other symptoms and signs involving the musculoskeletal system: Secondary | ICD-10-CM | POA: Diagnosis not present

## 2022-08-15 DIAGNOSIS — T8484XD Pain due to internal orthopedic prosthetic devices, implants and grafts, subsequent encounter: Secondary | ICD-10-CM | POA: Diagnosis not present

## 2022-08-15 MED ORDER — POTASSIUM CHLORIDE CRYS ER 10 MEQ PO TBCR: 30.0000 meq | EXTENDED_RELEASE_TABLET | Freq: Two times a day (BID) | ORAL | 3 refills | Status: DC

## 2022-08-15 MED ORDER — VITAMIN D (ERGOCALCIFEROL) 1.25 MG (50000 UNIT) PO CAPS: 50000.0000 [IU] | ORAL_CAPSULE | ORAL | 3 refills | Status: DC

## 2022-08-15 NOTE — Chronic Care Management (AMB) (Signed)
Care Gap(s) Not Met that Need to be Addressed:  Kidney health evaluation for patient with diabetes   Action Taken: Reviewed chart last eGFR 06-14-2022 55. Unable to find uACR. Sent message to CPP. Next CPP visit 09-04-2022 PCP 10-03-2022.   Follow Up: Follow up in October  Malecca Hicks CMA Clinical Pharmacist Assistant [phone]

## 2022-08-19 ENCOUNTER — Ambulatory Visit
Admission: RE | Admit: 2022-08-19 | Discharge: 2022-08-19 | Disposition: A | Discharge status: Home / Self Care | Payer: Medicare Other | Source: Ambulatory Visit | Attending: Nurse Practitioner | Admitting: Nurse Practitioner

## 2022-08-19 ENCOUNTER — Other Ambulatory Visit: Payer: Self-pay

## 2022-08-19 DIAGNOSIS — R051 Acute cough: Secondary | ICD-10-CM

## 2022-08-19 DIAGNOSIS — R059 Cough, unspecified: Secondary | ICD-10-CM | POA: Diagnosis not present

## 2022-08-21 ENCOUNTER — Other Ambulatory Visit: Payer: Self-pay | Admitting: Nurse Practitioner

## 2022-08-21 MED ORDER — HYDROCODONE BIT-HOMATROP MBR 5-1.5 MG/5ML PO SOLN
5.0000 mL | Freq: Four times a day (QID) | ORAL | 0 refills | Status: DC | PRN
Start: 2022-08-21 — End: 2023-01-06

## 2022-08-21 MED ORDER — AMOXICILLIN-POT CLAVULANATE 875-125 MG PO TABS: 1.0000 | ORAL_TABLET | Freq: Two times a day (BID) | ORAL | 0 refills | Status: DC

## 2022-08-23 ENCOUNTER — Ambulatory Visit: Payer: Self-pay

## 2022-08-23 NOTE — Patient Outreach (Signed)
  Care Coordination   Follow Up Visit Note   08/23/2022 Name: Christine Mcdowell MRN: [id number] DOB: 1971/04/09  Christine Mcdowell is a 51 y.o. year old female who sees [name], [place] for primary care. I spoke with  Christine Mcdowell by phone today.  What matters to the patients health and wellness today?  Patient would like for her cough and shortness of breath to resolve without complications.     Goals Addressed     Patient Stated     To recover from [place] 70 (pt-stated)        Care Coordination Interventions: Determined patient continues to experience cough and shortness of breath Determined patient reported progressive symptoms to PCP provider [name] FNP Reviewed and discussed treatment recommendations per PCP including indication and usage of newly prescribed antibiotic and use of prescribed nebulizer's, completion of chest xray Encouraged patient to contact her PCP to report persistent symptoms and or to go to Urgent Care or ED if needed for worsening symptoms     SDOH assessments and interventions completed:  No     Care Coordination Interventions Activated:  Yes  Care Coordination Interventions:  Yes, provided   Follow up plan: Follow up call scheduled for 09/09/22 '@09'$ :15 AM    Encounter Outcome:  Pt. Visit Completed

## 2022-08-23 NOTE — Patient Instructions (Signed)
Visit Information  Thank you for taking time to visit with me today. Please don't hesitate to contact me if I can be of assistance to you.   Following are the goals we discussed today:   Goals Addressed     Patient Stated     To recover from [place] 17 (pt-stated)        Care Coordination Interventions: Determined patient continues to experience cough and shortness of breath Determined patient reported progressive symptoms to PCP provider [name] FNP Reviewed and discussed treatment recommendations per PCP including indication and usage of newly prescribed antibiotic and use of prescribed nebulizer's, completion of chest xray Encouraged patient to contact her PCP to report persistent symptoms and or to go to Urgent Care or ED if needed for worsening symptoms     Our next appointment is by telephone on 09/09/22 at 09:15 AM   Please call the care guide team at [phone] if you need to cancel or reschedule your appointment.   If you are experiencing a Mental Health or [place] or need someone to talk to, please call [phone]-TALK (toll free, 24 hour hotline)  Patient verbalizes understanding of instructions and care plan provided today and agrees to view in [place]. Active MyChart status and patient understanding of how to access instructions and care plan via MyChart confirmed with patient.     Barb Merino, RN, BSN, CCM Care Management Coordinator [facility] Care Management Direct Phone: [phone]

## 2022-08-25 ENCOUNTER — Other Ambulatory Visit: Payer: Self-pay

## 2022-08-25 ENCOUNTER — Emergency Department (HOSPITAL_BASED_OUTPATIENT_CLINIC_OR_DEPARTMENT_OTHER)
Admission: EM | Admit: 2022-08-25 | Discharge: 2022-08-25 | Disposition: A | Discharge status: Home / Self Care | Payer: Medicare Other | Attending: Emergency Medicine | Admitting: Emergency Medicine

## 2022-08-25 ENCOUNTER — Emergency Department (HOSPITAL_BASED_OUTPATIENT_CLINIC_OR_DEPARTMENT_OTHER): Payer: Medicare Other

## 2022-08-25 ENCOUNTER — Encounter (HOSPITAL_BASED_OUTPATIENT_CLINIC_OR_DEPARTMENT_OTHER): Payer: Self-pay | Admitting: Emergency Medicine

## 2022-08-25 DIAGNOSIS — R053 Chronic cough: Secondary | ICD-10-CM

## 2022-08-25 DIAGNOSIS — M3210 Systemic lupus erythematosus, organ or system involvement unspecified: Secondary | ICD-10-CM | POA: Insufficient documentation

## 2022-08-25 DIAGNOSIS — Z794 Long term (current) use of insulin: Secondary | ICD-10-CM | POA: Diagnosis not present

## 2022-08-25 DIAGNOSIS — J42 Unspecified chronic bronchitis: Secondary | ICD-10-CM

## 2022-08-25 DIAGNOSIS — J45909 Unspecified asthma, uncomplicated: Secondary | ICD-10-CM | POA: Insufficient documentation

## 2022-08-25 DIAGNOSIS — I110 Hypertensive heart disease with heart failure: Secondary | ICD-10-CM | POA: Diagnosis not present

## 2022-08-25 DIAGNOSIS — Z7952 Long term (current) use of systemic steroids: Secondary | ICD-10-CM | POA: Insufficient documentation

## 2022-08-25 DIAGNOSIS — Z7982 Long term (current) use of aspirin: Secondary | ICD-10-CM | POA: Diagnosis not present

## 2022-08-25 DIAGNOSIS — Z7951 Long term (current) use of inhaled steroids: Secondary | ICD-10-CM | POA: Insufficient documentation

## 2022-08-25 DIAGNOSIS — I509 Heart failure, unspecified: Secondary | ICD-10-CM | POA: Insufficient documentation

## 2022-08-25 DIAGNOSIS — Z91040 Latex allergy status: Secondary | ICD-10-CM | POA: Diagnosis not present

## 2022-08-25 DIAGNOSIS — R059 Cough, unspecified: Secondary | ICD-10-CM | POA: Diagnosis present

## 2022-08-25 DIAGNOSIS — Z79899 Other long term (current) drug therapy: Secondary | ICD-10-CM | POA: Insufficient documentation

## 2022-08-25 DIAGNOSIS — R0602 Shortness of breath: Secondary | ICD-10-CM | POA: Diagnosis not present

## 2022-08-25 DIAGNOSIS — E876 Hypokalemia: Secondary | ICD-10-CM | POA: Insufficient documentation

## 2022-08-25 DIAGNOSIS — Z7984 Long term (current) use of oral hypoglycemic drugs: Secondary | ICD-10-CM | POA: Insufficient documentation

## 2022-08-25 DIAGNOSIS — U099 Post covid-19 condition, unspecified: Secondary | ICD-10-CM | POA: Insufficient documentation

## 2022-08-25 DIAGNOSIS — J9811 Atelectasis: Secondary | ICD-10-CM | POA: Diagnosis not present

## 2022-08-25 DIAGNOSIS — J40 Bronchitis, not specified as acute or chronic: Secondary | ICD-10-CM | POA: Diagnosis not present

## 2022-08-25 DIAGNOSIS — M329 Systemic lupus erythematosus, unspecified: Secondary | ICD-10-CM

## 2022-08-25 LAB — COMPREHENSIVE METABOLIC PANEL
ALT: 8 U/L (ref 0–44)
AST: 12 U/L — ABNORMAL LOW (ref 15–41)
Albumin: 4.4 g/dL (ref 3.5–5.0)
Alkaline Phosphatase: 48 U/L (ref 38–126)
Anion gap: 11 (ref 5–15)
BUN: 13 mg/dL (ref 6–20)
CO2: 29 mmol/L (ref 22–32)
Calcium: 8.7 mg/dL — ABNORMAL LOW (ref 8.9–10.3)
Chloride: 100 mmol/L (ref 98–111)
Creatinine, Ser: 0.97 mg/dL (ref 0.44–1.00)
GFR, Estimated: >60 mL/min (ref >60)
Glucose, Bld: 130 mg/dL — ABNORMAL HIGH (ref 70–99)
Potassium: 2.9 mmol/L — ABNORMAL LOW (ref 3.5–5.1)
Sodium: 140 mmol/L (ref 135–145)
Total Bilirubin: 0.5 mg/dL (ref 0.3–1.2)
Total Protein: 7.2 g/dL (ref 6.5–8.1)

## 2022-08-25 LAB — CBC WITH DIFFERENTIAL/PLATELET
Abs Immature Granulocytes: 0.04 10*3/uL (ref 0.00–0.07)
Basophils Absolute: 0 10*3/uL (ref 0.0–0.1)
Basophils Relative: 0 %
Eosinophils Absolute: 0 10*3/uL (ref 0.0–0.5)
Eosinophils Relative: 0 %
HCT: 30.1 % — ABNORMAL LOW (ref 36.0–46.0)
Hemoglobin: 9.9 g/dL — ABNORMAL LOW (ref 12.0–15.0)
Immature Granulocytes: 1 %
Lymphocytes Relative: 15 %
Lymphs Abs: 1.1 10*3/uL (ref 0.7–4.0)
MCH: 27.7 pg (ref 26.0–34.0)
MCHC: 32.9 g/dL (ref 30.0–36.0)
MCV: 84.3 fL (ref 80.0–100.0)
Monocytes Absolute: 0.5 10*3/uL (ref 0.1–1.0)
Monocytes Relative: 7 %
Neutro Abs: 5.9 10*3/uL (ref 1.7–7.7)
Neutrophils Relative %: 77 %
Platelets: 238 10*3/uL (ref 150–400)
RBC: 3.57 MIL/uL — ABNORMAL LOW (ref 3.87–5.11)
RDW: 14.7 % (ref 11.5–15.5)
WBC: 7.6 10*3/uL (ref 4.0–10.5)
nRBC: 0 % (ref 0.0–0.2)

## 2022-08-25 MED ORDER — PREDNISONE 50 MG PO TABS
60.0000 mg | ORAL_TABLET | Freq: Once | ORAL | Status: AC
  Administered 2022-08-25: 60 mg via ORAL
  Filled 2022-08-25: qty 1

## 2022-08-25 MED ORDER — PREDNISONE 10 MG PO TABS: 40.0000 mg | ORAL_TABLET | Freq: Every day | ORAL | 0 refills | Status: DC

## 2022-08-25 MED ORDER — POTASSIUM CHLORIDE CRYS ER 20 MEQ PO TBCR: 20.0000 meq | EXTENDED_RELEASE_TABLET | Freq: Two times a day (BID) | ORAL | 0 refills | Status: DC

## 2022-08-25 MED ORDER — POTASSIUM CHLORIDE CRYS ER 20 MEQ PO TBCR
40.0000 meq | EXTENDED_RELEASE_TABLET | Freq: Once | ORAL | Status: AC
  Administered 2022-08-25: 40 meq via ORAL
  Filled 2022-08-25: qty 2

## 2022-08-25 NOTE — Discharge Instructions (Addendum)
Take the prednisone as directed for the next 5 days.  Follow-up with your primary care doctor and pulmonology.  Also take the potassium as directed potassium was low.  Continue to use your inhalers.  Return for any new or worse symptoms.

## 2022-08-25 NOTE — ED Triage Notes (Signed)
Pt is also on chronic prednisone for lupus and inhaler for asthma

## 2022-08-25 NOTE — ED Triage Notes (Signed)
Pt had COVID a month ago. Since then she has had a nonproductive cough, lots of mucus that she cant clear.her primary saw her on Thursday, did chest xray, started antibiotic. Pt feels worse today, she is CHF patient , and feels like she is retaining fluid, her face is swollen. And has shortness of breath.

## 2022-08-25 NOTE — ED Notes (Signed)
RT note: Pt. seen/assessment obtained, score of 3 for bronchodilator therapy PRN, remains on room air, RT to monitor.

## 2022-08-25 NOTE — ED Notes (Signed)
RT note: U/A obtained/labelled/given to Lab, RN made aware.

## 2022-08-25 NOTE — ED Provider Notes (Signed)
[facility] EMERGENCY DEPT Provider Note   CSN: [id number] Arrival date & time: 08/25/22  4098     History  No chief complaint on file.   Christine Mcdowell is a 51 y.o. female.  Patient has a history of lupus.  Patient also 1 month ago had COVID.  Is struggled with a lot of cough and feeling of shortness of breath since that time.  Patient currently on low-dose prednisone.  Patient has nebulizers and albuterol inhalers.  Past medical history significant for the lupus hypertension asthma congestive heart failure diabetes without complications.  Patient is not a smoker.  Patient is followed by pulmonary medicine.  Patient currently taking 5 mg of prednisone daily.  And using over-the-counter Mucinex.  Saw primary care provider for this recently and that was the end of this past week.  And they suggest that if things get worse she may need a full course of prednisone.  In addition patient with a history of face swelling.  No tongue swelling.  No rash.  No difficulty swallowing.       Home Medications Prior to Admission medications   Medication Sig Start Date End Date Taking? Authorizing Provider  potassium chloride SA (KLOR-CON M) 20 MEQ tablet Take 1 tablet (20 mEq total) by mouth 2 (two) times daily. 08/25/22  Yes Fredia Sorrow, MD  predniSONE (DELTASONE) 10 MG tablet Take 4 tablets (40 mg total) by mouth daily. 08/25/22  Yes Fredia Sorrow, MD  amoxicillin-clavulanate (AUGMENTIN) 875-125 MG tablet Take 1 tablet by mouth 2 (two) times daily. 08/21/22   [name], FNP  aspirin 81 MG chewable tablet Chew 162 mg by mouth daily. Taking 2 tablets    [provider]  atorvastatin (LIPITOR) 10 MG tablet Take 1 tablet (10 mg total) by mouth daily. 07/03/22   [name], FNP  carvedilol (COREG) 25 MG tablet Take 1 tablet (25 mg total) by mouth 2 (two) times daily with a meal. 04/12/22   Bensimhon, Shaune Pascal, MD  cetirizine (ZYRTEC ALLERGY) 10 MG tablet Take 1 tablet (10  mg total) by mouth daily. 07/24/22 07/24/23  [name], MD  Continuous Blood Gluc Receiver (DEXCOM G6 RECEIVER) DEVI 1 Device by Does not apply route 3 (three) times daily before meals. 04/12/20   [name], FNP  Continuous Blood Gluc Sensor (DEXCOM G6 SENSOR) MISC Inject 1 each into the skin 3 (three) times daily. 07/04/22   [name], FNP  Continuous Blood Gluc Sensor (DEXCOM G7 SENSOR) MISC Use to check blood sugar 3 times day dx code e11.65 07/19/22   [name], FNP  Continuous Blood Gluc Transmit (DEXCOM G6 TRANSMITTER) MISC 1 Device by Does not apply route 3 (three) times daily before meals. 07/03/22   [name], FNP  diclofenac Sodium (VOLTAREN) 1 % GEL apply A SMALL AMOUNT TO involved joints UP TO TWICE DAILY 12/20/21   [provider]  folic acid (FOLVITE) 1 MG tablet TAKE 1 TABLET BY MOUTH EVERY MORNING 01/30/17   Bensimhon, Shaune Pascal, MD  furosemide (LASIX) 40 MG tablet Take by mouth.    [provider]  Glucagon (GVOKE HYPOPEN 2-PACK) 1 MG/0.2ML SOAJ Inject 1 mg into the skin as needed. 07/02/22   [name], FNP  hydrALAZINE (APRESOLINE) 25 MG tablet Take 0.5 tablets (12.5 mg total) by mouth 3 (three) times daily. 03/08/22   [place], Maricela Bo, FNP  HYDROcodone bit-homatropine (HYDROMET) 5-1.5 MG/5ML syrup Take 5 mLs by mouth every 6 (six) hours as needed for cough. 08/21/22  [name], FNP  hydroxychloroquine (PLAQUENIL) 200 MG tablet Take 1 tablet (200 mg total) by mouth 2 (two) times daily. 02/02/19   Bensimhon, Shaune Pascal, MD  insulin degludec (TRESIBA FLEXTOUCH) 100 UNIT/ML FlexTouch Pen Inject 10 Units into the skin daily. Patient taking differently: Inject 15 Units into the skin at bedtime. 07/12/22   [name], FNP  insulin lispro (HUMALOG KWIKPEN) 100 UNIT/ML KwikPen Use according to sliding scale 12/13/21   [name], FNP  Insulin Pen Needle (NOVOFINE PLUS PEN NEEDLE) 32G X 4 MM MISC Use with insulin pens dx code e11.65 11/13/21   [name], FNP  Ipratropium-Albuterol (COMBIVENT RESPIMAT) 20-100 MCG/ACT AERS respimat Inhale 1 puff into the lungs every 6 (six) hours. 10/11/21   [name], FNP  ipratropium-albuterol (DUONEB) 0.5-2.5 (3) MG/3ML SOLN Take 3 mLs by nebulization every 6 (six) hours as needed. 10/11/21   [name], FNP  Lactobacillus (ACIDOPHILUS) CAPS capsule Take 1 capsule by mouth daily.    [provider]  Magnesium 400 MG TABS Take 1 tablet by mouth daily. 04/12/22   [name], FNP  metFORMIN (GLUCOPHAGE) 500 MG tablet Take 1 tablet (500 mg total) by mouth 2 (two) times daily. 04/12/22   [name], FNP  methotrexate (RHEUMATREX) 2.5 MG tablet Take 20 mg by mouth every Friday.  02/06/15   [provider]  metolazone (ZAROXOLYN) 2.5 MG tablet TAKE 1 TABLET BY MOUTH AS NEEDED FOR WEIGHT GAIN OF 5 POUNDS WITHIN 3 DAYS AS DIRECTED needs appt for further refills 03/16/21   Bensimhon, Shaune Pascal, MD  Multiple Vitamin (MULTIVITAMIN WITH MINERALS) TABS Take 1 tablet by mouth every morning.     [provider]  OLUMIANT tablet Take 2 mg by mouth daily. 03/08/22   [provider]  ondansetron (ZOFRAN-ODT) 8 MG disintegrating tablet Take 1 tablet (8 mg total) by mouth every 8 (eight) hours as needed for nausea or vomiting. 12/24/21   Lajean Saver, MD  PARoxetine (PAXIL) 10 MG tablet Take 1 tablet (10 mg total) by mouth every morning. 05/17/22   [name], FNP  potassium chloride (KLOR-CON M) 10 MEQ tablet Take 3 tablets (30 mEq total) by mouth 2 (two) times daily. 08/15/22   [name], FNP  predniSONE (DELTASONE) 10 MG tablet Take 1 tablet (10 mg total) by mouth daily. Tapering down to 5 mg every 7 days Patient taking differently: Take 5 mg by mouth daily. 07/02/22   [name], FNP  PROAIR HFA 108 [id number] Base) MCG/ACT inhaler INHALE 2 PUFFS BY MOUTH EVERY DAY AS NEEDED FOR SHORTNESS OF BREATH 03/07/20   [name], FNP  sacubitril-valsartan (ENTRESTO) 97-103 MG Take 1 tablet  by mouth 2 (two) times daily. 11/13/21   Bensimhon, Shaune Pascal, MD  spironolactone (ALDACTONE) 25 MG tablet TAKE ONE TABLET BY MOUTH EVERY MORNING and TAKE ONE TABLET BY MOUTH EVERY EVENING 05/10/22   Bensimhon, Shaune Pascal, MD  torsemide (DEMADEX) 20 MG tablet TAKE TWO TABLETS BY MOUTH ONCE DAILY 06/10/22   Bensimhon, Shaune Pascal, MD  traZODone (DESYREL) 50 MG tablet Take 50 mg by mouth at bedtime as needed. 07/05/21   [provider]  Vitamin D, Ergocalciferol, (DRISDOL) 1.25 MG (50000 UNIT) CAPS capsule Take 1 capsule (50,000 Units total) by mouth 2 (two) times a week. 08/15/22   [name], FNP      Allergies    Hydrocodone-acetaminophen, Hydromorphone, Iodinated contrast media, Other, Erythromycin, Latex, Rinvoq [upadacitinib], Farxiga [dapagliflozin], Tape, and Mircette [desogestrel-ethinyl estradiol]    Review  of Systems   Review of Systems  Constitutional:  Negative for chills and fever.  HENT:  Positive for facial swelling. Negative for ear pain and sore throat.   Eyes:  Negative for pain and visual disturbance.  Respiratory:  Positive for shortness of breath. Negative for cough.   Cardiovascular:  Negative for chest pain and palpitations.  Gastrointestinal:  Negative for abdominal pain and vomiting.  Genitourinary:  Negative for dysuria and hematuria.  Musculoskeletal:  Negative for arthralgias and back pain.  Skin:  Negative for color change and rash.  Neurological:  Negative for seizures and syncope.  All other systems reviewed and are negative.   Physical Exam Updated Vital Signs BP 120/69   Pulse 65   Temp 99.3 F (37.4 C) (Oral)   Resp 17   SpO2 100%  Physical Exam Vitals and nursing note reviewed.  Constitutional:      General: She is not in acute distress.    Appearance: Normal appearance. She is well-developed.  HENT:     Head: Normocephalic and atraumatic.     Comments: Face swollen, as per patient.  We cannot tell by clinically looking at her.  No tongue  swelling no lip swelling no oral swelling.    Mouth/Throat:     Mouth: Mucous membranes are moist.  Eyes:     Extraocular Movements: Extraocular movements intact.     Conjunctiva/sclera: Conjunctivae normal.     Pupils: Pupils are equal, round, and reactive to light.  Cardiovascular:     Rate and Rhythm: Normal rate and regular rhythm.     Heart sounds: No murmur heard. Pulmonary:     Effort: Pulmonary effort is normal. No respiratory distress.     Breath sounds: Normal breath sounds. No stridor. No wheezing, rhonchi or rales.  Chest:     Chest wall: No tenderness.  Abdominal:     Palpations: Abdomen is soft.     Tenderness: There is no abdominal tenderness.  Musculoskeletal:        General: No swelling.     Cervical back: Normal range of motion and neck supple.  Skin:    General: Skin is warm and dry.     Capillary Refill: Capillary refill takes less than 2 seconds.  Neurological:     General: No focal deficit present.     Mental Status: She is alert and oriented to person, place, and time.  Psychiatric:        Mood and Affect: Mood normal.     ED Results / Procedures / Treatments   Labs (all labs ordered are listed, but only abnormal results are displayed) Labs Reviewed  COMPREHENSIVE METABOLIC PANEL - Abnormal; Notable for the following components:      Result Value   Potassium 2.9 (*)    Glucose, Bld 130 (*)    Calcium 8.7 (*)    AST 12 (*)    All other components within normal limits  CBC WITH DIFFERENTIAL/PLATELET - Abnormal; Notable for the following components:   RBC 3.57 (*)    Hemoglobin 9.9 (*)    HCT 30.1 (*)    All other components within normal limits    EKG None  Radiology DG Chest Port 1 View  Result Date: 08/25/2022 CLINICAL DATA:  Shortness of breath. EXAM: PORTABLE CHEST 1 VIEW COMPARISON:  August 19, 2022. FINDINGS: Mild cardiomegaly. Mild bibasilar subsegmental atelectasis is noted. Left-sided defibrillator is unchanged in position. The  visualized skeletal structures are unremarkable. IMPRESSION: Mild bibasilar subsegmental atelectasis.  Electronically Signed   By: Marijo Conception M.D.   On: 08/25/2022 10:16    Procedures Procedures    Medications Ordered in ED Medications  predniSONE (DELTASONE) tablet 60 mg (60 mg Oral Given 08/25/22 1148)  potassium chloride SA (KLOR-CON M) CR tablet 40 mEq (40 mEq Oral Given 08/25/22 1310)    ED Course/ Medical Decision Making/ A&P                           Medical Decision Making Amount and/or Complexity of Data Reviewed Labs: ordered. Radiology: ordered.  Risk Prescription drug management.   Suspect symptoms could be related to post-COVID.  Large just a chronic bronchitis at this point in time.  Certainly makes sense to put her on a 5-day course of prednisone will give 60 mg here today.  Regarding the facial swelling not really sure what the cause of that would be.  But there is no complications associated with it.  In actuality the prednisone may help.  Could be related to her lupus.  But she is never had facial swelling before.  Complete metabolic panel normal with the exception of potassium being 2.9.  Renal function is normal.  CBC normal no leukocytosis with the exception of a mild anemia with hemoglobin 9.9.Marland Kitchen  Chest x-ray no acute findings.  Patient will receive 40 mill equivalents of potassium here orally she has normal renal function so I will continue her with potassium at home close follow-up with her doctor to have that rechecked.  And start her on the prednisone 40 mg a day for 5 days.   Final Clinical Impression(s) / ED Diagnoses Final diagnoses:  Chronic bronchitis, unspecified chronic bronchitis type ([place])  Post-COVID chronic cough  Lupus ([place])  Hypokalemia    Rx / DC Orders ED Discharge Orders          Ordered    potassium chloride SA (KLOR-CON M) 20 MEQ tablet  2 times daily        08/25/22 1308    predniSONE (DELTASONE) 10 MG tablet  Daily         08/25/22 1308              Fredia Sorrow, MD 08/25/22 1313

## 2022-08-28 ENCOUNTER — Telehealth: Payer: Self-pay

## 2022-08-28 ENCOUNTER — Ambulatory Visit (INDEPENDENT_AMBULATORY_CARE_PROVIDER_SITE_OTHER): Payer: Medicare Other

## 2022-08-28 DIAGNOSIS — Z95810 Presence of automatic (implantable) cardiac defibrillator: Secondary | ICD-10-CM

## 2022-08-28 LAB — CUP PACEART REMOTE DEVICE CHECK
Battery Remaining Longevity: 138 mo
Battery Remaining Percentage: 96 %
Brady Statistic RV Percent Paced: 0 %
Date Time Interrogation Session: 20230906045100
HighPow Impedance: 78 Ohm
Implantable Lead Implant Date: 20190206
Implantable Lead Location: 753860
Implantable Lead Model: 292
Implantable Lead Serial Number: 438194
Implantable Pulse Generator Implant Date: 20190206
Lead Channel Impedance Value: 586 Ohm
Lead Channel Setting Pacing Amplitude: 2.5 V
Lead Channel Setting Pacing Pulse Width: 0.4 ms
Lead Channel Setting Sensing Sensitivity: 0.5 mV
Pulse Gen Serial Number: 243572

## 2022-08-28 NOTE — Telephone Encounter (Signed)
Transition Care Management Follow-up Telephone Call Date of discharge and from where: 08/25/2022 [facility]  How have you been since you were released from the hospital? Pt states she feels alright, she still experiences a cough. Any questions or concerns? No  Items Reviewed: Did the pt receive and understand the discharge instructions provided? Yes  Medications obtained and verified? Yes  Other? Yes  Any new allergies since your discharge? No  Dietary orders reviewed? Yes Do you have support at home? Yes   Home Care and Equipment/Supplies: Were home health services ordered? no If so, what is the name of the agency? N/a  Has the agency set up a time to come to the patient's home? no Were any new equipment or medical supplies ordered?  No What is the name of the medical supply agency? N/a Were you able to get the supplies/equipment? no Do you have any questions related to the use of the equipment or supplies? No  Functional Questionnaire: (I = Independent and D = Dependent) ADLs: i  Bathing/Dressing- i  Meal Prep- i  Eating- i  Maintaining continence- i  Transferring/Ambulation- i  Managing Meds- i  Follow up appointments reviewed:  PCP Hospital f/u appt confirmed? Yes  Scheduled to see [name] on via mychart @ home. [facility] f/u appt confirmed? No  Scheduled to see n/a on n/a @ n/a. Are transportation arrangements needed? No  If their condition worsens, is the pt aware to call PCP or go to the Emergency Dept.? Yes Was the patient provided with contact information for the PCP's office or ED? Yes Was to pt encouraged to call back with questions or concerns? Yes

## 2022-08-29 ENCOUNTER — Telehealth (INDEPENDENT_AMBULATORY_CARE_PROVIDER_SITE_OTHER): Payer: Medicare Other | Admitting: Nurse Practitioner

## 2022-08-29 VITALS — BP 147/97 | HR 101 | Temp 97.8°F | Wt 190.0 lb

## 2022-08-29 DIAGNOSIS — J410 Simple chronic bronchitis: Secondary | ICD-10-CM

## 2022-08-29 MED ORDER — BUDESONIDE-FORMOTEROL FUMARATE 80-4.5 MCG/ACT IN AERO: 2.0000 | INHALATION_SPRAY | Freq: Two times a day (BID) | RESPIRATORY_TRACT | 12 refills | Status: DC

## 2022-08-29 NOTE — Progress Notes (Signed)
Virtual Visit via Telephone due to failed video call via MyChart   This visit type was conducted due to national recommendations for restrictions regarding the COVID-19 Pandemic (e.g. social distancing) in an effort to limit this patient's exposure and mitigate transmission in our community.  Due to her co-morbid illnesses, this patient is at least at moderate risk for complications without adequate follow up.  This format is felt to be most appropriate for this patient at this time.  All issues noted in this document were discussed and addressed.  A limited physical exam was performed with this format.    This visit type was conducted due to national recommendations for restrictions regarding the COVID-19 Pandemic (e.g. social distancing) in an effort to limit this patient's exposure and mitigate transmission in our community.  Patients identity confirmed using two different identifiers.  This format is felt to be most appropriate for this patient at this time.  All issues noted in this document were discussed and addressed.  No physical exam was performed (except for noted visual exam findings with Video Visits).    Date:  08/29/2022   ID:  [name], DOB 1971-05-27, MRN [id number]  Patient Location:  [facility] - spoke with [name]  Provider location:   Office    Chief Complaint:  continued cough  History of Present Illness:    Wei Newbrough is a 51 y.o. female who presents via video conferencing for a telehealth visit today.    The patient does not have symptoms concerning for COVID-19 infection (fever, chills, cough, or new shortness of breath).   She had continued cough until recently it has improved. She had increased coughing and congestion. Seen at ER, while getting ready for church she became more short of breath more than usual. She was also feeling weak. Was also coughing at night. She was not having any relief with the Albuterol nebulizer. She is on 40  mg daily prednisone - total 7 days. Her cough is not as productive. Continues to have difficulty with sleeping at night due to the prednisone. She has completed her antibiotics.   She has not reached out the Pulmonologist. She is taking symbicort once a day. She is taking the hydromet and zyrtec.      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   Discoid lupus    Fibromyalgia    History of blood transfusion "several"   "related to anemia; had some w/hysterectomy also"   Hx of cardiovascular stress test    ETT-Myoview (9/15):  No ischemia, EF 52%; NORMAL   Hx of echocardiogram    Echo (9/15):  EF 50-55%, ant HK, Gr 1 DD, mild MR, mild LAE, no effusion   Hypertension    Iron deficiency anemia    h/o iron transfusions   Lupus (systemic lupus erythematosus) ([place])    Migraine    "a few/year" (07/03/2016)   Pneumonia 12/2015   RA (rheumatoid arthritis) ([place])    "all over" (07/03/2016)   Sickle cell trait ([place])    Stroke ([place]) 2014 X 1; 2015 X 2; 2016 X 1;    "right side of face more relaxed than the other; rare speech hesitation" (07/03/2016)  Vaginal Pap smear, abnormal    ASCUS; HPV   Past Surgical History:  Procedure Laterality Date   ABDOMINAL HYSTERECTOMY  2009   ABDOMINAL WOUND DEHISCENCE  2009   BUNIONECTOMY Left 06/15/2020   CARDIAC CATHETERIZATION N/A 10/11/2016   Procedure: Right/Left Heart Cath and Coronary Angiography;  Surgeon: Jolaine Artist, MD;  Location: [facility];  Service: Cardiovascular;  Laterality: N/A;   DILATION AND CURETTAGE OF UTERUS  1991   HEMATOMA EVACUATION  2009   abdomen   ICD IMPLANT  01/28/2018   ICD IMPLANT N/A 01/28/2018   Procedure: ICD IMPLANT;  Surgeon: Evans Lance, MD;   Location: [facility];  Service: Cardiovascular;  Laterality: N/A;   INCISE AND DRAIN ABCESS  2009 X 2   "abdomen after hysterectomy"   KNEE ARTHROSCOPY Right 1997   KNEE SURGERY Right    TUBAL LIGATION  1996     Current Meds  Medication Sig   budesonide-formoterol (SYMBICORT) 80-4.5 MCG/ACT inhaler Inhale 2 puffs into the lungs in the morning and at bedtime.     Allergies:   Hydrocodone-acetaminophen, Hydromorphone, Iodinated contrast media, Other, Erythromycin, Latex, Rinvoq [upadacitinib], Farxiga [dapagliflozin], Tape, and Mircette [desogestrel-ethinyl estradiol]   Social History   Tobacco Use   Smoking status: Never   Smokeless tobacco: Never  Vaping Use   Vaping Use: Never used  Substance Use Topics   Alcohol use: No   Drug use: No     Family Hx: The patient's family history includes Allergies in her father and mother; Arthritis in her mother; Asthma in her maternal grandmother; Cancer in her paternal grandfather; Cushing syndrome in her father; Dementia in her paternal grandmother; Depression in her father; Diabetes in her maternal grandmother; Drug abuse in her mother; Heart attack in her father and maternal grandfather; Heart murmur in her mother; Hypertension in her maternal grandmother. There is no history of Breast cancer.  ROS:   Please see the history of present illness.    Review of Systems  HENT:  Positive for congestion.   Respiratory:  Positive for cough.     All other systems reviewed and are negative.   Labs/Other Tests and Data Reviewed:    Recent Labs: 03/05/2022: B Natriuretic Peptide 19.9 03/08/2022: Magnesium 1.7 08/25/2022: ALT 8; BUN 13; Creatinine, Ser 0.97; Hemoglobin 9.9; Platelets 238; Potassium 2.9; Sodium 140   Recent Lipid Panel Lab Results  Component Value Date/Time   CHOL 188 02/26/2022 03:48 PM   TRIG 108 02/26/2022 03:48 PM   HDL 48 02/26/2022 03:48 PM   CHOLHDL 3.9 02/26/2022 03:48 PM   LDLCALC 120 (H) 02/26/2022 03:48 PM     Wt Readings from Last 3 Encounters:  08/29/22 190 lb (86.2 kg)  07/24/22 192 lb (87.1 kg)  07/02/22 187 lb (84.8 kg)     Exam:    Vital Signs:  BP (!) 147/97   Pulse (!) 101   Temp 97.8 F (36.6 C) (Oral)   Wt 190 lb (86.2 kg)   BMI 29.76 kg/m     Physical Exam Vitals reviewed.  Constitutional:      General: She is not in acute distress.    Appearance: Normal appearance.  Pulmonary:     Effort: Pulmonary effort is normal. No respiratory distress.  Neurological:     General: No focal deficit present.     Mental Status: She is alert and oriented to person, place, and time. Mental status is at baseline.     Cranial Nerves:  No cranial nerve deficit.  Psychiatric:        Mood and Affect: Mood and affect normal.        Behavior: Behavior normal.        Thought Content: Thought content normal.        Cognition and Memory: Memory normal.        Judgment: Judgment normal.     ASSESSMENT & PLAN:    1. Simple chronic bronchitis (HCC) Improving after taking prednisone 7 days and augmentin 7 days. Continues to have a cough, advised to use her Symbicort 2 times a day. If not better call Pulm.   COVID-19 Education: The signs and symptoms of COVID-19 were discussed with the patient and how to seek care for testing (follow up with PCP or arrange E-visit).  The importance of social distancing was discussed today.  Patient Risk:   After full review of this patients clinical status, I feel that they are at least moderate risk at this time.  Time:   Today, I have spent 9 minutes/ seconds with the patient with telehealth technology discussing above diagnoses.     Medication Adjustments/Labs and Tests Ordered: Current medicines are reviewed at length with the patient today.  Concerns regarding medicines are outlined above.   Tests Ordered: No orders of the defined types were placed in this encounter.   Medication Changes: Meds ordered this encounter  Medications    budesonide-formoterol (SYMBICORT) 80-4.5 MCG/ACT inhaler    Sig: Inhale 2 puffs into the lungs in the morning and at bedtime.    Dispense:  1 each    Refill:  12    Disposition:  Follow up prn  Signed, [name], FNP

## 2022-08-29 NOTE — Patient Instructions (Signed)
  Acute Bronchitis, Adult  Acute bronchitis is when air tubes in the lungs (bronchi) suddenly get swollen. The condition can make it hard for you to breathe. In adults, acute bronchitis usually goes away within 2 weeks. A cough caused by bronchitis may last up to 3 weeks. Smoking, allergies, and asthma can make the condition worse. What are the causes? Germs that cause cold and flu (viruses). The most common cause of this condition is the virus that causes the common cold. Bacteria. Substances that bother (irritate) the lungs, including: Smoke from cigarettes and other types of tobacco. Dust and pollen. Fumes from chemicals, gases, or burned fuel. Indoor or outdoor air pollution. What increases the risk? A weak body's defense system. This is also called the immune system. Any condition that affects your lungs and breathing, such as asthma. What are the signs or symptoms? A cough. Coughing up clear, yellow, or green mucus. Making high-pitched whistling sounds when you breathe, most often when you breathe out (wheezing). Runny or stuffy nose. Having too much mucus in your lungs (chest congestion). Shortness of breath. Body aches. A sore throat. How is this treated? Acute bronchitis may go away over time without treatment. Your doctor may tell you to: Drink more fluids. This will help thin your mucus so it is easier to cough up. Use a device that gets medicine into your lungs (inhaler). Use a vaporizer or a humidifier. These are machines that add water to the air. This helps with coughing and poor breathing. Take a medicine that thins mucus and helps clear it from your lungs. Take a medicine that prevents or stops coughing. It is not common to take an antibiotic medicine for this condition. Follow these instructions at home:  Take over-the-counter and prescription medicines only as told by your doctor. Use an inhaler, vaporizer, or humidifier as told by your doctor. Take two  teaspoons (10 mL) of honey at bedtime. This helps lessen your coughing at night. Drink enough fluid to keep your pee (urine) pale yellow. Do not smoke or use any products that contain nicotine or tobacco. If you need help quitting, ask your doctor. Get a lot of rest. Return to your normal activities when your doctor says that it is safe. Keep all follow-up visits. How is this prevented?  Wash your hands often with soap and water for at least 20 seconds. If you cannot use soap and water, use hand sanitizer. Avoid contact with people who have cold symptoms. Try not to touch your mouth, nose, or eyes with your hands. Avoid breathing in smoke or chemical fumes. Make sure to get the flu shot every year. Contact a doctor if: Your symptoms do not get better in 2 weeks. You have trouble coughing up the mucus. Your cough keeps you awake at night. You have a fever. Get help right away if: You cough up blood. You have chest pain. You have very bad shortness of breath. You faint or keep feeling like you are going to faint. You have a very bad headache. Your fever or chills get worse. These symptoms may be an emergency. Get help right away. Call your local emergency services (911 in the [place] Do not wait to see if the symptoms will go away. Do not drive yourself to the hospital. Summary Acute bronchitis is when air tubes in the lungs (bronchi) suddenly get swollen. In adults, acute bronchitis usually goes away within 2 weeks. Drink more fluids. This will help thin your mucus so it   is easier to cough up. Take over-the-counter and prescription medicines only as told by your doctor. Contact a doctor if your symptoms do not improve after 2 weeks of treatment. This information is not intended to replace advice given to you by your health care provider. Make sure you discuss any questions you have with your health care provider. Document Revised: 04/11/2021 Document Reviewed: 04/11/2021 [employer]  Patient Education  2023 [employer]  

## 2022-08-30 ENCOUNTER — Telehealth: Payer: Self-pay

## 2022-08-30 NOTE — Telephone Encounter (Signed)
Error

## 2022-09-02 ENCOUNTER — Telehealth: Payer: Self-pay

## 2022-09-02 NOTE — Chronic Care Management (AMB) (Signed)
    Christine Mcdowell was reminded to have all medications, supplements and any blood glucose and blood pressure readings available for review with Orlando Penner, Pharm. D, at her telephone visit on 09-04-2022 at 3:00.   Questions: Have you had any recent office visit or specialist visit outside of [facility]? Patient stated no  Are there any concerns you would like to discuss during your office visit? Patient stated no   Are you having any problems obtaining your medications? (Whether it pharmacy issues or cost) Patient stated no  If patient has any PAP medications ask if they are having any problems getting their PAP medication or refill? No PAP medications  Care Gaps: Yearly ophthalmology exam overdue Covid booster overdue Flu vaccine overdue AWV 10-03-2022  Star Rating Drug: Entresto 97-103 mg- Last filled 08-16-2022 30 DS upstream Metformin 500 mg- Last filled 08-16-2022 30 DS upstream Atorvastatin 10 mg- Last filled 07-10-2022 90 DS upstream  Any gaps in medications fill history? No  [place] Pharmacist Assistant [phone]

## 2022-09-04 ENCOUNTER — Ambulatory Visit: Payer: Medicare Other

## 2022-09-04 DIAGNOSIS — E1165 Type 2 diabetes mellitus with hyperglycemia: Secondary | ICD-10-CM

## 2022-09-04 DIAGNOSIS — I110 Hypertensive heart disease with heart failure: Secondary | ICD-10-CM

## 2022-09-04 NOTE — Progress Notes (Signed)
Chronic Care Management Pharmacy Note  09/10/2022 Name:  Christine Mcdowell MRN:  [id number] DOB:  05-Jan-1971  Summary: Patient reports   Recommendations/Changes made from today's visit: Recommend patient continue current medication regimen  Recommend patient continue to weight herself daily, check her BP and blood sugars, also recommended writing down the readings a  log book  Plan: She would like to maintain her quality of life, and her weight where it currently is.   Subjective: Christine Mcdowell is an 51 y.o. year old female who is a primary patient of [name], [place].  The CCM team was consulted for assistance with disease management and care coordination needs.    Engaged with patient by telephone for follow up visit in response to provider referral for pharmacy case management and/or care coordination services.   Consent to Services:  The patient was given information about Chronic Care Management services, agreed to services, and gave verbal consent prior to initiation of services.  Please see initial visit note for detailed documentation.   Patient Care Team: [name], FNP as PCP - General (General Practice) Bensimhon, Shaune Pascal, MD as PCP - Cardiology (Cardiology) Evans Lance, MD as PCP - Electrophysiology (Cardiology) Lendon Colonel, NP as Nurse Practitioner (Nurse Practitioner) [name], RN as Case Manager Mayford Knife, [facility] (Pharmacist)  Recent office visits: 07/02/2022 PCP OV  Recent consult visits: 08/01/2022 Ortho OV  07/02/2022 Ortho OV  06/20/2022 Oncology OV 05/29/2022 Cardiology [facility] visits: 08/25/2022 ED OV - chronic bronchitis 07/16/2022 Knee Revision  03/05/2022 ED visit-chest pain   Objective:  Lab Results  Component Value Date   CREATININE 0.97 08/25/2022   BUN 13 08/25/2022   GFR 86.64 02/28/2015   EGFR 62 02/26/2022   GFRNONAA >60 08/25/2022   GFRAA 74 08/30/2020   NA 140 08/25/2022   K 2.9 (L) 08/25/2022    CALCIUM 8.7 (L) 08/25/2022   CO2 29 08/25/2022   GLUCOSE 130 (H) 08/25/2022    Lab Results  Component Value Date/Time   HGBA1C 7.0 (H) 02/26/2022 03:42 PM   HGBA1C 6.4 (H) 10/11/2021 12:55 PM   GFR 86.64 02/28/2015 10:05 AM   GFR 91.33 02/03/2015 10:33 AM   MICROALBUR 10 08/24/2019 06:06 PM    Last diabetic Eye exam:  Lab Results  Component Value Date/Time   HMDIABEYEEXA No Retinopathy 08/26/2018 12:00 AM    Last diabetic Foot exam: No results found for: "HMDIABFOOTEX"   Lab Results  Component Value Date   CHOL 188 02/26/2022   HDL 48 02/26/2022   LDLCALC 120 (H) 02/26/2022   TRIG 108 02/26/2022   CHOLHDL 3.9 02/26/2022       Latest Ref Rng & Units 08/25/2022   12:00 PM 03/21/2022   12:52 PM 10/11/2021   12:55 PM  Hepatic Function  Total Protein 6.5 - 8.1 g/dL 7.2  7.4  7.4   Albumin 3.5 - 5.0 g/dL 4.4  4.5  4.9   AST 15 - 41 U/L _0 ALT 0 - 44 U/L _1 Alk Phosphatase 38 - 126 U/L 48  48  68   Total Bilirubin 0.3 - 1.2 mg/dL 0.5  0.3  0.3     Lab Results  Component Value Date/Time   TSH 0.652 04/12/2020 05:31 PM   TSH 0.750 03/29/2019 03:46 PM       Latest Ref Rng & Units 08/25/2022   12:00 PM 06/20/2022  10:03 AM 03/21/2022   12:52 PM  CBC  WBC 4.0 - 10.5 K/uL 7.6  5.2  5.4   Hemoglobin 12.0 - 15.0 g/dL 9.9  10.1  10.3   Hematocrit 36.0 - 46.0 % 30.1  31.2  31.8   Platelets 150 - 400 K/uL 238  272  247     Lab Results  Component Value Date/Time   VD25OH 75.8 08/30/2020 05:29 PM   VD25OH 43.2 04/12/2020 05:31 PM    Clinical ASCVD: No  The 10-year ASCVD risk score (Arnett DK, et al., 2019) is: 17.7%   Values used to calculate the score:     Age: 51 years     Sex: Female     Is Non-Hispanic African American: Yes     Diabetic: Yes     Tobacco smoker: No     Systolic Blood Pressure: 507 mmHg     Is BP treated: Yes     HDL Cholesterol: 48 mg/dL     Total Cholesterol: 188 mg/dL       09/06/2021    3:11 PM 10/09/2020    2:26 PM  08/30/2020    3:33 PM  Depression screen PHQ 2/9  Decreased Interest 0 0 0  Down, Depressed, Hopeless 0 0 0  PHQ - 2 Score 0 0 0  Altered sleeping  2   Tired, decreased energy  0   Change in appetite  0   Feeling bad or failure about yourself   0   Trouble concentrating  0   Moving slowly or fidgety/restless  0   Suicidal thoughts  0   PHQ-9 Score  2   Difficult doing work/chores  Not difficult at all      Social History   Tobacco Use  Smoking Status Never  Smokeless Tobacco Never   BP Readings from Last 3 Encounters:  08/29/22 (!) 147/97  08/25/22 120/70  07/02/22 120/86   Pulse Readings from Last 3 Encounters:  08/29/22 (!) 101  08/25/22 74  07/02/22 71   Wt Readings from Last 3 Encounters:  08/29/22 190 lb (86.2 kg)  07/24/22 192 lb (87.1 kg)  07/02/22 187 lb (84.8 kg)   BMI Readings from Last 3 Encounters:  08/29/22 29.76 kg/m  07/24/22 30.07 kg/m  07/02/22 29.29 kg/m    Assessment/Interventions: Review of patient past medical history, allergies, medications, health status, including review of consultants reports, laboratory and other test data, was performed as part of comprehensive evaluation and provision of chronic care management services.   SDOH:  (Social Determinants of Health) assessments and interventions performed: No SDOH Interventions    Flowsheet Row ESTABLISHED CHF from 08/30/2021 in [facility] from 10/04/2020 in [facility] Chronic Care Management from 08/30/2020 in [facility] Chronic Care Management from 08/02/2020 in [facility] Chronic Care Management from 07/20/2020 in [facility] Chronic Care Management from 06/19/2020 in [facility]  SDOH Interventions        Food Insecurity Interventions -- -- -- -- -- Intervention Not Indicated  [Patient receving boost in FNS due  to pandemic- reports buying shelf sustainable food to save and freeze for future needs when benefit stops]  Housing Interventions Other (Comment)  [helping to connect with Section 8] Other (Comment)  [Provided website to apply for financial assistance from [employer] and [employer]] -- -- [id number] Referral  [Will refer for utility assistance on 8/2 as  most agencies are out of funds by the end of the month] Intervention Not Indicated  [Patient has overcome past hardship and recently bought a condominium]  Financial Strain Interventions Other (Comment)  [will help connect pt to resources/patient care fund if she finds available apartment] -- Intervention Not Indicated  [Medications are free] Intervention Not Indicated  [Medications are free] -- --      SDOH Screenings   Food Insecurity: No Food Insecurity (09/06/2021)  Housing: Medium Risk (08/30/2021)  Transportation Needs: No Transportation Needs (09/06/2021)  Depression (PHQ2-9): Low Risk  (09/06/2021)  Financial Resource Strain: High Risk (09/06/2021)  Physical Activity: Inactive (09/06/2021)  Stress: Stress Concern Present (09/06/2021)  Tobacco Use: Low Risk  (08/25/2022)    CCM Care Plan  Allergies  Allergen Reactions   Hydrocodone-Acetaminophen Nausea And Vomiting   Hydromorphone Nausea And Vomiting    Other reaction(s): GI Upset (intolerance), Hypertension (intolerance) Raises blood pressure  Other reaction(s): GI Upset (intolerance), Hypertension (intolerance) Raises blood pressure to stroke level   Iodinated Contrast Media Other (See Comments)    Shuts down kidneys Shuts kidney function down   Other Other (See Comments) and Anaphylaxis    Spicy foods and seasonings Skin Prep "makes my skin peel off" Paper tape causes skin burns   Erythromycin Nausea And Vomiting   Latex Hives   Rinvoq [Upadacitinib] Other (See Comments)   Farxiga [Dapagliflozin] Other (See Comments)   Tape Other (See Comments)    "Skin burns"   Mircette  [Desogestrel-Ethinyl Estradiol] Nausea And Vomiting and Rash    Medications Reviewed Today     Reviewed by [name], RN (Registered Nurse) on 09/09/22 at [place] List Status: <None>   Medication Order Taking? Sig Documenting Provider Last Dose Status Informant  amoxicillin-clavulanate (AUGMENTIN) 875-125 MG tablet [id number] No Take 1 tablet by mouth 2 (two) times daily. [name], FNP Taking Active   aspirin 81 MG chewable tablet [id number] No Chew 162 mg by mouth daily. Taking 2 tablets [provider] Taking Active Self  atorvastatin (LIPITOR) 10 MG tablet [id number] No Take 1 tablet (10 mg total) by mouth daily. [name], FNP Taking Active   budesonide-formoterol [facility]) 80-4.5 MCG/ACT inhaler [id number]  Inhale 2 puffs into the lungs in the morning and at bedtime. [name], FNP  Active   carvedilol (COREG) 25 MG tablet [id number] No Take 1 tablet (25 mg total) by mouth 2 (two) times daily with a meal. Bensimhon, Shaune Pascal, MD Taking Active   cetirizine (ZYRTEC ALLERGY) 10 MG tablet [id number] No Take 1 tablet (10 mg total) by mouth daily. [name], MD Taking Active   Continuous Blood Gluc Receiver (DEXCOM G6 RECEIVER) DEVI [id number] No 1 Device by Does not apply route 3 (three) times daily before meals. [name], FNP Taking Active   Continuous Blood Gluc Sensor (DEXCOM G6 SENSOR) MISC [id number] No Inject 1 each into the skin 3 (three) times daily. [name], FNP Taking Active   Continuous Blood Gluc Sensor (DEXCOM G7 SENSOR) MISC [id number] No Use to check blood sugar 3 times day dx code e11.65 [name], FNP Taking Active   Continuous Blood Gluc Transmit (DEXCOM G6 TRANSMITTER) MISC [id number] No 1 Device by Does not apply route 3 (three) times daily before meals. [name], FNP Taking Active   diclofenac Sodium (VOLTAREN) 1 % GEL [id number] No apply A SMALL AMOUNT TO involved joints UP TO TWICE DAILY [provider] Taking Active    folic acid (FOLVITE) 1 MG tablet  [id number] No TAKE 1 TABLET BY MOUTH EVERY MORNING Bensimhon, Shaune Pascal, MD Taking Active Self  furosemide (LASIX) 40 MG tablet [id number] No Take by mouth. [provider] Taking Active   Glucagon (GVOKE HYPOPEN 2-PACK) 1 MG/0.2ML SOAJ [id number] No Inject 1 mg into the skin as needed. [name], FNP Taking Active   hydrALAZINE (APRESOLINE) 25 MG tablet [id number] No Take 0.5 tablets (12.5 mg total) by mouth 3 (three) times daily. [place], Maricela Bo, FNP Taking Active   HYDROcodone bit-homatropine (HYDROMET) 5-1.5 MG/5ML syrup [id number] No Take 5 mLs by mouth every 6 (six) hours as needed for cough. [name], FNP Taking Active   hydroxychloroquine (PLAQUENIL) 200 MG tablet [id number] No Take 1 tablet (200 mg total) by mouth 2 (two) times daily. Bensimhon, Shaune Pascal, MD Taking Active   insulin degludec (TRESIBA FLEXTOUCH) 100 UNIT/ML FlexTouch Pen [id number] No Inject 10 Units into the skin daily.  Patient taking differently: Inject 15 Units into the skin at bedtime.   [name], FNP Taking Active   insulin lispro (HUMALOG KWIKPEN) 100 UNIT/ML KwikPen [id number] No Use according to sliding scale [name], FNP Taking Active   Insulin Pen Needle (NOVOFINE PLUS PEN NEEDLE) 32G X 4 MM MISC [id number] No Use with insulin pens dx code e11.65 [name], FNP Taking Active   Ipratropium-Albuterol (COMBIVENT RESPIMAT) 20-100 MCG/ACT AERS respimat [id number] No Inhale 1 puff into the lungs every 6 (six) hours. [name], FNP Taking Active   ipratropium-albuterol (DUONEB) 0.5-2.5 (3) MG/3ML SOLN [id number] No Take 3 mLs by nebulization every 6 (six) hours as needed. [name], FNP Taking Active   Lactobacillus (ACIDOPHILUS) CAPS capsule [id number] No Take 1 capsule by mouth daily. [provider] Taking Active   Magnesium 400 MG TABS [id number] No Take 1 tablet by mouth daily. [name], FNP Taking Active   metFORMIN (GLUCOPHAGE) 500 MG  tablet [id number] No Take 1 tablet (500 mg total) by mouth 2 (two) times daily. [name], FNP Taking Active   methotrexate [facility]) 2.5 MG tablet [id number] No Take 20 mg by mouth every Friday.  [provider] Taking Active Self           Med Note Deboraha Sprang Oct 28, 2016 10:48 AM)    metolazone (ZAROXOLYN) 2.5 MG tablet [id number] No TAKE 1 TABLET BY MOUTH AS NEEDED FOR WEIGHT GAIN OF 5 POUNDS WITHIN 3 DAYS AS DIRECTED needs appt for further refills Bensimhon, Shaune Pascal, MD Taking Active   Multiple Vitamin (MULTIVITAMIN WITH MINERALS) TABS [id number] No Take 1 tablet by mouth every morning.  [provider] Taking Active Self  OLUMIANT tablet [id number] No Take 2 mg by mouth daily. [provider] Taking Active   ondansetron (ZOFRAN-ODT) 8 MG disintegrating tablet [id number] No Take 1 tablet (8 mg total) by mouth every 8 (eight) hours as needed for nausea or vomiting. Lajean Saver, MD Taking Active   PARoxetine (PAXIL) 10 MG tablet [id number] No Take 1 tablet (10 mg total) by mouth every morning. [name], FNP Taking Active   potassium chloride (KLOR-CON M) 10 MEQ tablet [id number] No Take 3 tablets (30 mEq total) by mouth 2 (two) times daily. [name], FNP Taking Active   potassium chloride SA (KLOR-CON M) 20 MEQ tablet [id number] No Take 1 tablet (20 mEq total) by mouth 2 (two) times daily. Fredia Sorrow, MD Taking Active   predniSONE (DELTASONE) 10 MG tablet [id number] No Take 1 tablet (10 mg total) by mouth daily. Tapering  down to 5 mg every 7 days  Patient taking differently: Take 5 mg by mouth daily.   [name], FNP Taking Active   predniSONE (DELTASONE) 10 MG tablet [id number] No Take 4 tablets (40 mg total) by mouth daily. Fredia Sorrow, MD Taking Active   PROAIR HFA 108 [id number] Base) MCG/ACT inhaler [id number] No INHALE 2 PUFFS BY MOUTH EVERY DAY AS NEEDED FOR SHORTNESS OF Placido Sou, FNP Taking Active   sacubitril-valsartan  (ENTRESTO) 97-103 MG [id number] No Take 1 tablet by mouth 2 (two) times daily. Bensimhon, Shaune Pascal, MD Taking Active   spironolactone (ALDACTONE) 25 MG tablet [id number]  NEEDS FOLLOW UP APPOINTMENT FOR ANYMORE REFILLS TAKE ONE TABLET BY MOUTH EVERY MORNING and TAKE ONE TABLET BY MOUTH EVERY EVENING Bensimhon, Shaune Pascal, MD  Active   torsemide (DEMADEX) 20 MG tablet [id number] No TAKE TWO TABLETS BY MOUTH ONCE DAILY Bensimhon, Shaune Pascal, MD Taking Active   traZODone (DESYREL) 50 MG tablet [id number] No Take 50 mg by mouth at bedtime as needed. [provider] Taking Active   Vitamin D, Ergocalciferol, (DRISDOL) 1.25 MG (50000 UNIT) CAPS capsule [id number] No Take 1 capsule (50,000 Units total) by mouth 2 (two) times a week. [name], FNP Taking Active             Patient Active Problem List   Diagnosis Date Noted   Anemia 03/21/2022   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   History of COVID-19 04/12/2020   COVID-19 virus infection 03/07/2020   Lower abdominal pain 22/48/2500   Chronic systolic (congestive) heart failure ([place]) 01/28/2018   Cough 01/19/2018   Pulmonary infiltrates on CXR 01/19/2018   Migraines 10/08/2017   Sleep apnea with use of continuous positive airway pressure (CPAP) 09/24/2017   Rheumatoid arthritis ([place]) 10/09/2016   Asthma 10/09/2016   Chronic pain 10/09/2016   Heart failure ([place]) 10/09/2016   Congestive heart failure ([place])    Lupus (systemic lupus erythematosus) ([place])    Diabetes mellitus with complication (HCC)    Anxiety state    GERD (gastroesophageal reflux disease) 12/28/2015   Essential hypertension 12/26/2015   Type 2 diabetes mellitus ([place]) 37/03/8888   Chronic systolic heart failure ([place]) 08/18/2014   Cardiomyopathy, dilated ([place]) 01/27/2014   Shortness of breath 01/26/2014   SLE (systemic lupus erythematosus) ([place]) 01/26/2014    Immunization History  Administered Date(s) Administered   Influenza, High Dose  Seasonal PF 09/02/2018   Influenza, Seasonal, Injecte, Preservative Fre 11/06/2017, 08/24/2019   Influenza,inj,Quad PF,6+ Mos 11/06/2017, 08/24/2019, 08/30/2020, 09/14/2021   Influenza-Unspecified 08/08/2017, 11/06/2017, 08/30/2020, 09/14/2021   PFIZER(Purple Top)SARS-COV-2 Vaccination 06/16/2020, 07/07/2020, 01/01/2021   Pneumococcal Polysaccharide-23 09/14/2021   Pneumococcal-Unspecified 09/14/2021   Tdap 08/24/2019, 08/24/2019   Zoster Recombinat (Shingrix) 02/26/2022, 07/02/2022    Conditions to be addressed/monitored:  Hypertension and Diabetes  Care Plan : [place]  Updates made by Mayford Knife, [place] since 09/10/2022 12:00 AM     Problem: HTN ,DM II      Goal: Disease Management   Note:   Current Barriers:  Unable to independently afford treatment regimen Unable to independently monitor therapeutic efficacy Unable to achieve control of diabetes    Pharmacist Clinical Goal(s):  Patient will verbalize ability to afford treatment regimen achieve adherence to monitoring guidelines and medication adherence to achieve therapeutic efficacy through collaboration with PharmD and provider.   Interventions: 1:1 collaboration with [name], FNP regarding development and update of comprehensive plan of care as evidenced by provider attestation  and co-signature Inter-disciplinary care team collaboration (see longitudinal plan of care) Comprehensive medication review performed; medication list updated in electronic medical record  Hypertension (BP goal <130/80) -Controlled -Current treatment: Hydralazine 25 mg tablet -take 1 tablet by mouth three times per day Appropriate, Effective, Safe, Accessible Carvedilol 25 mg tablet taking 1 tablet by mouth two times per day Appropriate, Effective, Safe, Accessible Torsemide 20 mg tablet taking two tablets daily Appropriate, Effective, Safe, Accessible -Current home readings: 138/93 this morning. Pulse  -Current  dietary habits: she is eating pretty good, the cost of food is very high. She tries to go to [employer] for their giveaways and tries to take advantage of there fresh fruits and vegetables  -Congratulated her on not eating any salt, and avoiding fried foods  -Denies hypotensive/hypertensive symptoms -Educated on BP goals and benefits of medications for prevention of heart attack, stroke and kidney damage; -Counseled to monitor BP at home everyday, document, and provide log at future appointments -Recommended to continue current medication   Diabetes (A1c goal <7%) -Not ideally controlled -Current medications: Humalog sliding scale - she has had to use it because of the starts at 150 Appropriate, Effective, Safe, Accessible Metformin 500 mg tablet once tablet twice per day Appropriate, Effective, Safe, Accessible Tresiba 100 unit/ml - inject 10 units into the skin daily Appropriate, Effective, Safe, Accessible  -Current home glucose readings: her readings sometimes go up in the evening  fasting glucose: in the morning her readings are 160-180, due to prednisone  post prandial glucose: after eatings and a couple hours later they go down to 120 -Denies hypoglycemic/hyperglycemic symptoms -Current meal patterns: she is trying to eat healthy and quality of life  -Current exercise: she was going to the aquatic center for 1 hour. Knee surgery in July so she has been doing physical therapy twice per week and walking her dogs. -Educated on A1c and blood sugar goals; Benefits of routine self-monitoring of blood sugar; - She had to use her GVOKE pen for a blood sugar of 72 -Counseled to check feet daily and get yearly eye exams -Recommended to continue current medication  Patient Goals/Self-Care Activities Patient will:  - take medications as prescribed as evidenced by patient report and record review  Follow Up Plan: The patient has been provided with contact information for the care  management team and has been advised to call with any health related questions or concerns.       Medication Assistance: None required.  Patient affirms current coverage meets needs.  Compliance/Adherence/Medication fill history: Care Gaps: COVID-19 Vaccine  Ophthalmology [place] Influenza Vacine  Hemoglobin A1c  Star-Rating Drugs: Atorvastatin 10 mg tablet  Metformin 500 mg tablet   Patient's preferred pharmacy is:  [facility], [address] AT [address] [place] [address] Phone: [phone] Fax: [phone]  [facility] - [place], [place] - [address] [place] [address] Phone: [phone] Fax: [phone]  Uses pill box? Yes Pt endorses 90% compliance  We discussed: Benefits of medication synchronization, packaging and delivery as well as enhanced pharmacist oversight with [facility]. Patient decided to: Continue current medication management strategy  Care Plan and Follow Up Patient Decision:  Patient agrees to Care Plan and Follow-up.  Plan: The patient has been provided with contact information for the care management team and has been advised to call with any health related questions or concerns.  Orlando Penner, CPP, PharmD Clinical Pharmacist Practitioner [facility] [phone]

## 2022-09-06 ENCOUNTER — Other Ambulatory Visit (HOSPITAL_COMMUNITY): Payer: Self-pay | Admitting: Internal Medicine

## 2022-09-09 ENCOUNTER — Telehealth: Payer: Self-pay

## 2022-09-09 ENCOUNTER — Ambulatory Visit: Payer: Self-pay

## 2022-09-09 NOTE — Patient Instructions (Signed)
Visit Information  Thank you for taking time to visit with me today. Please don't hesitate to contact me if I can be of assistance to you.   Following are the goals we discussed today:   Goals Addressed               This Visit's Progress     Patient Stated     To recover from [place] 19 (pt-stated)        Care Coordination Interventions: Determined patient continues to experience cough and shortness of breath, nasal congestion, frequent headaches and pressure and swelling over her maxilla sinuses Collaborated with PCP to report worsening of symptoms  Determined PCP will prescribe a new antibiotic to treat patient for symptoms suggestive of sinusitis Determined patient experiences multiple sinus infections throughout the year, especially during change of season  Determined PCP will send an ENT referral for further evaluation of chronic sinusitis  Encouraged patient to contact her PCP to report persistent symptoms and or to go to Urgent Care or ED if needed for worsening symptoms         Our next appointment is by telephone on 09/23/22 at 11:45 AM   Please call the care guide team at [phone] if you need to cancel or reschedule your appointment.   If you are experiencing a Mental Health or [place] or need someone to talk to, please call [phone] (toll free, 24 hour hotline)  Patient verbalizes understanding of instructions and care plan provided today and agrees to view in [place]. Active MyChart status and patient understanding of how to access instructions and care plan via MyChart confirmed with patient.     Barb Merino, RN, BSN, CCM Care Management Coordinator [facility] Care Management Direct Phone: [phone]

## 2022-09-09 NOTE — Chronic Care Management (AMB) (Signed)
Chronic Care Management Pharmacy Assistant   Name: Christine Mcdowell  MRN: [id number] DOB: [birth year]  Reason for Encounter: Medication Review/ Medication coordination  Recent office visits:  None  Recent consult visits:  None  Hospital visits:  Medication Reconciliation was completed by comparing discharge summary, patient's EMR and Pharmacy list, and upon discussion with patient.  Admitted to the hospital on 08-25-2022 due to Chronic bronchitis. Discharge date was 08-25-2022. Discharged from [place]?Medications Started at [facility] Discharge:?? Potassium chloride 20 meq twice daily Prednisone 40 mg daily  Medication Changes at Hospital Discharge: None  Medications Discontinued at Hospital Discharge: None  Medications that remain the same after Hospital Discharge:??  -All other medications will remain the same.    Medications: Outpatient Encounter Medications as of 09/09/2022  Medication Sig   amoxicillin-clavulanate (AUGMENTIN) 875-125 MG tablet Take 1 tablet by mouth 2 (two) times daily.   aspirin 81 MG chewable tablet Chew 162 mg by mouth daily. Taking 2 tablets   atorvastatin (LIPITOR) 10 MG tablet Take 1 tablet (10 mg total) by mouth daily.   budesonide-formoterol (SYMBICORT) 80-4.5 MCG/ACT inhaler Inhale 2 puffs into the lungs in the morning and at bedtime.   carvedilol (COREG) 25 MG tablet Take 1 tablet (25 mg total) by mouth 2 (two) times daily with a meal.   cetirizine (ZYRTEC ALLERGY) 10 MG tablet Take 1 tablet (10 mg total) by mouth daily.   Continuous Blood Gluc Receiver (DEXCOM G6 RECEIVER) DEVI 1 Device by Does not apply route 3 (three) times daily before meals.   Continuous Blood Gluc Sensor (DEXCOM G6 SENSOR) MISC Inject 1 each into the skin 3 (three) times daily.   Continuous Blood Gluc Sensor (DEXCOM G7 SENSOR) MISC Use to check blood sugar 3 times day dx code e11.65   Continuous Blood Gluc Transmit (DEXCOM G6  TRANSMITTER) MISC 1 Device by Does not apply route 3 (three) times daily before meals.   diclofenac Sodium (VOLTAREN) 1 % GEL apply A SMALL AMOUNT TO involved joints UP TO TWICE DAILY   folic acid (FOLVITE) 1 MG tablet TAKE 1 TABLET BY MOUTH EVERY MORNING   furosemide (LASIX) 40 MG tablet Take by mouth.   Glucagon (GVOKE HYPOPEN 2-PACK) 1 MG/0.2ML SOAJ Inject 1 mg into the skin as needed.   hydrALAZINE (APRESOLINE) 25 MG tablet Take 0.5 tablets (12.5 mg total) by mouth 3 (three) times daily.   HYDROcodone bit-homatropine (HYDROMET) 5-1.5 MG/5ML syrup Take 5 mLs by mouth every 6 (six) hours as needed for cough.   hydroxychloroquine (PLAQUENIL) 200 MG tablet Take 1 tablet (200 mg total) by mouth 2 (two) times daily.   insulin degludec (TRESIBA FLEXTOUCH) 100 UNIT/ML FlexTouch Pen Inject 10 Units into the skin daily. (Patient taking differently: Inject 15 Units into the skin at bedtime.)   insulin lispro (HUMALOG KWIKPEN) 100 UNIT/ML KwikPen Use according to sliding scale   Insulin Pen Needle (NOVOFINE PLUS PEN NEEDLE) 32G X 4 MM MISC Use with insulin pens dx code e11.65   Ipratropium-Albuterol (COMBIVENT RESPIMAT) 20-100 MCG/ACT AERS respimat Inhale 1 puff into the lungs every 6 (six) hours.   ipratropium-albuterol (DUONEB) 0.5-2.5 (3) MG/3ML SOLN Take 3 mLs by nebulization every 6 (six) hours as needed.   Lactobacillus (ACIDOPHILUS) CAPS capsule Take 1 capsule by mouth daily.   Magnesium 400 MG TABS Take 1 tablet by mouth daily.   metFORMIN (GLUCOPHAGE) 500 MG tablet Take 1 tablet (500 mg total) by mouth 2 (two) times daily.  methotrexate (RHEUMATREX) 2.5 MG tablet Take 20 mg by mouth every Friday.    metolazone (ZAROXOLYN) 2.5 MG tablet TAKE 1 TABLET BY MOUTH AS NEEDED FOR WEIGHT GAIN OF 5 POUNDS WITHIN 3 DAYS AS DIRECTED needs appt for further refills   Multiple Vitamin (MULTIVITAMIN WITH MINERALS) TABS Take 1 tablet by mouth every morning.    OLUMIANT tablet Take 2 mg by mouth daily.    ondansetron (ZOFRAN-ODT) 8 MG disintegrating tablet Take 1 tablet (8 mg total) by mouth every 8 (eight) hours as needed for nausea or vomiting.   PARoxetine (PAXIL) 10 MG tablet Take 1 tablet (10 mg total) by mouth every morning.   potassium chloride (KLOR-CON M) 10 MEQ tablet Take 3 tablets (30 mEq total) by mouth 2 (two) times daily.   potassium chloride SA (KLOR-CON M) 20 MEQ tablet Take 1 tablet (20 mEq total) by mouth 2 (two) times daily.   predniSONE (DELTASONE) 10 MG tablet Take 1 tablet (10 mg total) by mouth daily. Tapering down to 5 mg every 7 days (Patient taking differently: Take 5 mg by mouth daily.)   predniSONE (DELTASONE) 10 MG tablet Take 4 tablets (40 mg total) by mouth daily.   PROAIR HFA 108 (90 Base) MCG/ACT inhaler INHALE 2 PUFFS BY MOUTH EVERY DAY AS NEEDED FOR SHORTNESS OF BREATH   sacubitril-valsartan (ENTRESTO) 97-103 MG Take 1 tablet by mouth 2 (two) times daily.   spironolactone (ALDACTONE) 25 MG tablet NEEDS FOLLOW UP APPOINTMENT FOR ANYMORE REFILLS TAKE ONE TABLET BY MOUTH EVERY MORNING and TAKE ONE TABLET BY MOUTH EVERY EVENING   torsemide (DEMADEX) 20 MG tablet TAKE TWO TABLETS BY MOUTH ONCE DAILY   traZODone (DESYREL) 50 MG tablet Take 50 mg by mouth at bedtime as needed.   Vitamin D, Ergocalciferol, (DRISDOL) 1.25 MG (50000 UNIT) CAPS capsule Take 1 capsule (50,000 Units total) by mouth 2 (two) times a week.   No facility-administered encounter medications on file as of 09/09/2022.   Reviewed chart for medication changes ahead of medication coordination call.  No OVs, Consults, or hospital visits since last care coordination call/Pharmacist visit.  No medication changes indicated   BP Readings from Last 3 Encounters:  08/29/22 (!) 147/97  08/25/22 120/70  07/02/22 120/86    Lab Results  Component Value Date   HGBA1C 7.0 (H) 02/26/2022     Patient obtains medications through Vials  30 Days   Last adherence delivery included:  Entresto 97 mg/103 mg-  one tablet twice a day TruePlus Pen Needles 32 G-use as directed Vitamin D 50,000 units- one capsules two times a week, Tuesday and Friday Folic acid 1 mg- daily Spironolactone 25 mg- one tablet twice a day Methotrexate 2.5 mg- 8 tabs every Friday Trazodone 50 mg- 1 tablet at bedtime as needed for sleep Carvedilol 25 mg- One tablet twice a day Metformin 500 mg- one tablet twice a day Potassium CL ER 10 Meq- 3 tabs twice daily Torsemide 20 mg twice daily  Patient declined (meds) last month: Tresiba- Filled on 07-10-2022 75 DS [facility] Atorvastatin- Filled on 07-10-2022 90 DS [facility] Humalog- Patient hasn't seen PCP yet to determine dosing  Patient is due for next adherence delivery on: 09-19-2022  Called patient and reviewed medications and coordinated delivery.  This delivery to include: Entresto 97 mg/103 mg- one tablet twice a day TruePlus Pen Needles 32 G-use as directed Vitamin D 50,000 units- one capsules two times a week, Tuesday and Friday Folic acid 1 mg- daily Spironolactone 25 mg-  one tablet twice a day Methotrexate 2.5 mg- 8 tabs every Friday Trazodone 50 mg- 1 tablet at bedtime as needed for sleep Carvedilol 25 mg- One tablet twice a day Metformin 500 mg- one tablet twice a day Potassium CL ER 10 Meq- 3 tabs twice daily Torsemide 20 mg twice daily  No acute/short fill needed  Patient declined the following medications: Tresiba- Filled on 07-10-2022 75 DS [facility] Atorvastatin- Filled on 07-10-2022 90 DS [facility] Humalog- Patient hasn't seen PCP yet to determine dosing  Patient needs refills for: sent by [facility] Spironolactone  Methotrexate Trazodone  Confirmed delivery date of 09-19-2022 advised patient that pharmacy will contact them the morning of delivery.  Care Gaps: Yearly ophthalmology exam overdue Covid booster overdue Flu vaccine overdue AWV 10-03-2022 [facility] overdue A1C overdue  Star Rating Drugs: Atorvastatin 10 mg- Last filled  07-10-2022 [facility] Metformin 500 mg- Last filled 08-16-2022 30 DS [facility] Entresto 97-103 mg- Last filled 08-16-2022 30 [facility]  [place] Clinical Pharmacist Assistant [phone]

## 2022-09-09 NOTE — Patient Outreach (Signed)
  Care Coordination   Follow Up Visit Note   09/09/2022 Name: Nishka Heide MRN: [id number] DOB: [birth year]  Kathia Covington is a 51 y.o. year old female who sees [name], [place] for primary care. I spoke with  [name] by phone today.  What matters to the patients health and wellness today?  Patient would like to recover from her symptoms of cough and nasal congestion and associated symptoms.      Goals Addressed               This Visit's Progress     Patient Stated     To recover from [place] 17 (pt-stated)        Care Coordination Interventions: Determined patient continues to experience cough and shortness of breath, nasal congestion, frequent headaches and pressure and swelling over her maxilla sinuses Collaborated with PCP to report worsening of symptoms  Determined PCP will prescribe a new antibiotic to treat patient for symptoms suggestive of sinusitis Determined patient experiences multiple sinus infections throughout the year, especially during change of season  Determined PCP will send an ENT referral for further evaluation of chronic sinusitis  Encouraged patient to contact her PCP to report persistent symptoms and or to go to Urgent Care or ED if needed for worsening symptoms         SDOH assessments and interventions completed:  No     Care Coordination Interventions Activated:  Yes  Care Coordination Interventions:  Yes, provided   Follow up plan: Follow up call scheduled for 09/23/22 '@11'$ :45 AM    Encounter Outcome:  Pt. Visit Completed

## 2022-09-10 ENCOUNTER — Encounter: Payer: Self-pay | Admitting: Nurse Practitioner

## 2022-09-10 ENCOUNTER — Other Ambulatory Visit: Payer: Self-pay | Admitting: Nurse Practitioner

## 2022-09-10 MED ORDER — AMOXICILLIN-POT CLAVULANATE 500-125 MG PO TABS: 1.0000 | ORAL_TABLET | Freq: Three times a day (TID) | ORAL | 0 refills | Status: DC

## 2022-09-10 NOTE — Patient Instructions (Addendum)
Visit Information It was great speaking with you today!  Please let me know if you have any questions about our visit.   Goals Addressed             This Visit's Progress    Set My Target A1C-Diabetes Type 2       Timeframe:  Long-Range Goal Priority:  High Start Date:                             Expected End Date:                       Follow Up Date 12/04/2022   - set target A1C    Why is this important?   Your target A1C is decided together by you and your doctor.  It is based on several things like your age and other health issues.    Notes: Please call if you have any questions about your medications         Patient Care Plan: CCM Pharmacy Care Plan     Problem Identified: HTN ,DM II      Goal: Disease Management   Note:   Current Barriers:  Unable to independently afford treatment regimen Unable to independently monitor therapeutic efficacy Unable to achieve control of diabetes    Pharmacist Clinical Goal(s):  Patient will verbalize ability to afford treatment regimen achieve adherence to monitoring guidelines and medication adherence to achieve therapeutic efficacy through collaboration with PharmD and provider.   Interventions: 1:1 collaboration with [name], FNP regarding development and update of comprehensive plan of care as evidenced by provider attestation and co-signature Inter-disciplinary care team collaboration (see longitudinal plan of care) Comprehensive medication review performed; medication list updated in electronic medical record  Hypertension (BP goal <130/80) -Controlled -Current treatment: Hydralazine 25 mg tablet -take 1 tablet by mouth three times per day Appropriate, Effective, Safe, Accessible Carvedilol 25 mg tablet taking 1 tablet by mouth two times per day Appropriate, Effective, Safe, Accessible Torsemide 20 mg tablet taking two tablets daily Appropriate, Effective, Safe, Accessible -Current home readings: 138/93 this  morning. Pulse  -Current dietary habits: she is eating pretty good, the cost of food is very high. She tries to go to [employer] for their giveaways and tries to take advantage of there fresh fruits and vegetables  -Congratulated her on not eating any salt, and avoiding fried foods  -Denies hypotensive/hypertensive symptoms -Educated on BP goals and benefits of medications for prevention of heart attack, stroke and kidney damage; -Counseled to monitor BP at home everyday, document, and provide log at future appointments -Recommended to continue current medication   Diabetes (A1c goal <7%) -Not ideally controlled -Current medications: Humalog sliding scale - she has had to use it because of the starts at 150 Appropriate, Effective, Safe, Accessible Metformin 500 mg tablet once tablet twice per day Appropriate, Effective, Safe, Accessible Tresiba 100 unit/ml - inject 10 units into the skin daily Appropriate, Effective, Safe, Accessible  -Current home glucose readings: her readings sometimes go up in the evening  fasting glucose: in the morning her readings are 160-180, due to prednisone  post prandial glucose: after eatings and a couple hours later they go down to 120 -Denies hypoglycemic/hyperglycemic symptoms -Current meal patterns: she is trying to eat healthy and quality of life  -Current exercise: she was going to the aquatic center for 1 hour. Knee surgery in July so she has  been doing physical therapy twice per week and walking her dogs. -Educated on A1c and blood sugar goals; Benefits of routine self-monitoring of blood sugar; - She had to use her GVOKE pen for a blood sugar of 72 -Counseled to check feet daily and get yearly eye exams -Recommended to continue current medication  Patient Goals/Self-Care Activities Patient will:  - take medications as prescribed as evidenced by patient report and record review  Follow Up Plan: The patient has been provided with contact  information for the care management team and has been advised to call with any health related questions or concerns.        Patient agreed to services and verbal consent obtained.   The patient verbalized understanding of instructions, educational materials, and care plan provided today and agreed to receive a mailed copy of patient instructions, educational materials, and care plan.   Orlando Penner, PharmD Clinical Pharmacist [facility] [phone]

## 2022-09-11 DIAGNOSIS — E1165 Type 2 diabetes mellitus with hyperglycemia: Secondary | ICD-10-CM | POA: Diagnosis not present

## 2022-09-12 DIAGNOSIS — E1165 Type 2 diabetes mellitus with hyperglycemia: Secondary | ICD-10-CM | POA: Diagnosis not present

## 2022-09-16 NOTE — Progress Notes (Signed)
Remote ICD transmission.   

## 2022-09-19 ENCOUNTER — Ambulatory Visit: Payer: Medicare Other | Admitting: Internal Medicine

## 2022-09-19 ENCOUNTER — Other Ambulatory Visit: Payer: Medicare Other

## 2022-09-21 ENCOUNTER — Ambulatory Visit (INDEPENDENT_AMBULATORY_CARE_PROVIDER_SITE_OTHER): Payer: Medicare Other

## 2022-09-21 ENCOUNTER — Telehealth: Payer: Self-pay

## 2022-09-21 DIAGNOSIS — Z0000 Encounter for general adult medical examination without abnormal findings: Secondary | ICD-10-CM | POA: Diagnosis not present

## 2022-09-21 NOTE — Progress Notes (Signed)
Subjective:   Christine Mcdowell is a 51 y.o. female who presents for Medicare Annual (Subsequent) preventive examination.  I connected with  [name] on 09/21/22 by a audio enabled telemedicine application and verified that I am speaking with the correct person using two identifiers.  Patient Location: Home  Provider Location: Home Office  I discussed the limitations of evaluation and management by telemedicine. The patient expressed understanding and agreed to proceed.        Objective:    Today's Vitals   09/21/22 1007  PainSc: 8    There is no height or weight on file to calculate BMI.     08/25/2022    9:30 AM 03/21/2022    1:35 PM 03/04/2022   11:12 PM 02/25/2022   12:50 PM 01/24/2022    4:54 AM 12/24/2021   11:24 AM 11/05/2021    9:13 AM  Advanced Directives  Does Patient Have a Medical Advance Directive? Yes Yes Yes No Yes Yes No  Type of Corporate treasurer of [place];Living will [place];Living will  [place];Living will    Does patient want to make changes to medical advance directive? No - Patient declined No - Patient declined   No - Patient declined    Copy of [place] in Chart?  No - copy requested No - copy requested  No - copy requested      Current Medications (verified) Outpatient Encounter Medications as of 09/21/2022  Medication Sig   amoxicillin-clavulanate (AUGMENTIN) 500-125 MG tablet Take 1 tablet (500 mg total) by mouth 3 (three) times daily.   aspirin 81 MG chewable tablet Chew 162 mg by mouth daily. Taking 2 tablets   atorvastatin (LIPITOR) 10 MG tablet Take 1 tablet (10 mg total) by mouth daily.   budesonide-formoterol (SYMBICORT) 80-4.5 MCG/ACT inhaler Inhale 2 puffs into the lungs in the morning and at bedtime.   carvedilol (COREG) 25 MG tablet Take 1 tablet (25 mg total) by mouth 2 (two) times daily with a meal.   cetirizine (ZYRTEC ALLERGY) 10 MG tablet Take 1  tablet (10 mg total) by mouth daily.   Continuous Blood Gluc Sensor (DEXCOM G7 SENSOR) MISC Use to check blood sugar 3 times day dx code e11.65   diclofenac Sodium (VOLTAREN) 1 % GEL apply A SMALL AMOUNT TO involved joints UP TO TWICE DAILY   folic acid (FOLVITE) 1 MG tablet TAKE 1 TABLET BY MOUTH EVERY MORNING   furosemide (LASIX) 40 MG tablet Take by mouth.   Glucagon (GVOKE HYPOPEN 2-PACK) 1 MG/0.2ML SOAJ Inject 1 mg into the skin as needed.   hydrALAZINE (APRESOLINE) 25 MG tablet Take 0.5 tablets (12.5 mg total) by mouth 3 (three) times daily.   hydroxychloroquine (PLAQUENIL) 200 MG tablet Take 1 tablet (200 mg total) by mouth 2 (two) times daily.   insulin degludec (TRESIBA FLEXTOUCH) 100 UNIT/ML FlexTouch Pen Inject 10 Units into the skin daily. (Patient taking differently: Inject 15 Units into the skin at bedtime.)   insulin lispro (HUMALOG KWIKPEN) 100 UNIT/ML KwikPen Use according to sliding scale   Insulin Pen Needle (NOVOFINE PLUS PEN NEEDLE) 32G X 4 MM MISC Use with insulin pens dx code e11.65   Ipratropium-Albuterol (COMBIVENT RESPIMAT) 20-100 MCG/ACT AERS respimat Inhale 1 puff into the lungs every 6 (six) hours.   ipratropium-albuterol (DUONEB) 0.5-2.5 (3) MG/3ML SOLN Take 3 mLs by nebulization every 6 (six) hours as needed.   Lactobacillus (ACIDOPHILUS) CAPS capsule Take 1  capsule by mouth daily.   Magnesium 400 MG TABS Take 1 tablet by mouth daily.   metFORMIN (GLUCOPHAGE) 500 MG tablet Take 1 tablet (500 mg total) by mouth 2 (two) times daily.   methotrexate (RHEUMATREX) 2.5 MG tablet Take 20 mg by mouth every Friday.    metolazone (ZAROXOLYN) 2.5 MG tablet TAKE 1 TABLET BY MOUTH AS NEEDED FOR WEIGHT GAIN OF 5 POUNDS WITHIN 3 DAYS AS DIRECTED needs appt for further refills   Multiple Vitamin (MULTIVITAMIN WITH MINERALS) TABS Take 1 tablet by mouth every morning.    OLUMIANT tablet Take 2 mg by mouth daily.   ondansetron (ZOFRAN-ODT) 8 MG disintegrating tablet Take 1 tablet (8 mg  total) by mouth every 8 (eight) hours as needed for nausea or vomiting.   potassium chloride (KLOR-CON M) 10 MEQ tablet Take 3 tablets (30 mEq total) by mouth 2 (two) times daily.   potassium chloride SA (KLOR-CON M) 20 MEQ tablet Take 1 tablet (20 mEq total) by mouth 2 (two) times daily.   predniSONE (DELTASONE) 10 MG tablet Take 1 tablet (10 mg total) by mouth daily. Tapering down to 5 mg every 7 days (Patient taking differently: Take 5 mg by mouth daily.)   PROAIR HFA 108 (90 Base) MCG/ACT inhaler INHALE 2 PUFFS BY MOUTH EVERY DAY AS NEEDED FOR SHORTNESS OF BREATH   sacubitril-valsartan (ENTRESTO) 97-103 MG Take 1 tablet by mouth 2 (two) times daily.   spironolactone (ALDACTONE) 25 MG tablet NEEDS FOLLOW UP APPOINTMENT FOR ANYMORE REFILLS TAKE ONE TABLET BY MOUTH EVERY MORNING and TAKE ONE TABLET BY MOUTH EVERY EVENING   torsemide (DEMADEX) 20 MG tablet TAKE TWO TABLETS BY MOUTH ONCE DAILY   traZODone (DESYREL) 50 MG tablet Take 50 mg by mouth at bedtime as needed.   Vitamin D, Ergocalciferol, (DRISDOL) 1.25 MG (50000 UNIT) CAPS capsule Take 1 capsule (50,000 Units total) by mouth 2 (two) times a week.   Continuous Blood Gluc Receiver (DEXCOM G6 RECEIVER) DEVI 1 Device by Does not apply route 3 (three) times daily before meals. (Patient not taking: Reported on 09/21/2022)   Continuous Blood Gluc Sensor (DEXCOM G6 SENSOR) MISC Inject 1 each into the skin 3 (three) times daily. (Patient not taking: Reported on 09/21/2022)   Continuous Blood Gluc Transmit (DEXCOM G6 TRANSMITTER) MISC 1 Device by Does not apply route 3 (three) times daily before meals. (Patient not taking: Reported on 09/21/2022)   HYDROcodone bit-homatropine (HYDROMET) 5-1.5 MG/5ML syrup Take 5 mLs by mouth every 6 (six) hours as needed for cough. (Patient not taking: Reported on 09/21/2022)   PARoxetine (PAXIL) 10 MG tablet Take 1 tablet (10 mg total) by mouth every morning. (Patient not taking: Reported on 09/21/2022)   predniSONE  (DELTASONE) 10 MG tablet Take 4 tablets (40 mg total) by mouth daily. (Patient not taking: Reported on 09/21/2022)   No facility-administered encounter medications on file as of 09/21/2022.    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   Discoid lupus    Fibromyalgia    History of blood transfusion "several"   "related to  anemia; had some w/hysterectomy also"   Hx of cardiovascular stress test    ETT-Myoview (9/15):  No ischemia, EF 52%; NORMAL   Hx of echocardiogram    Echo (9/15):  EF 50-55%, ant HK, Gr 1 DD, mild MR, mild LAE, no effusion   Hypertension    Iron deficiency anemia    h/o iron transfusions   Lupus (systemic lupus erythematosus) ([place])    Migraine    "a few/year" (07/03/2016)   Pneumonia 12/2015   RA (rheumatoid arthritis) ([place])    "all over" (07/03/2016)   Sickle cell trait ([place])    Stroke ([place]) 2014 X 1; 2015 X 2; 2016 X 1;    "right side of face more relaxed than the other; rare speech hesitation" (07/03/2016)   Vaginal Pap smear, abnormal    ASCUS; HPV   Past Surgical History:  Procedure Laterality Date   ABDOMINAL HYSTERECTOMY  2009   ABDOMINAL WOUND DEHISCENCE  2009   BUNIONECTOMY Left 06/15/2020   CARDIAC CATHETERIZATION N/A 10/11/2016   Procedure: Right/Left Heart Cath and Coronary Angiography;  Surgeon: Jolaine Artist, MD;  Location: [facility];  Service: Cardiovascular;  Laterality: N/A;   DILATION AND CURETTAGE OF UTERUS  1991   HEMATOMA EVACUATION  2009   abdomen    ICD IMPLANT  01/28/2018   ICD IMPLANT N/A 01/28/2018   Procedure: ICD IMPLANT;  Surgeon: Evans Lance, MD;  Location: [facility];  Service: Cardiovascular;  Laterality: N/A;   INCISE AND DRAIN ABCESS  2009 X 2   "abdomen after hysterectomy"   KNEE ARTHROSCOPY Right 1997   KNEE SURGERY Right    TUBAL LIGATION  1996   Family History  Problem Relation Age of Onset   Arthritis Mother    Heart murmur Mother    Drug abuse Mother    Allergies Mother    Heart attack Father    Cushing syndrome Father    Depression Father    Allergies Father    Dementia Paternal Grandmother    Cancer Paternal Grandfather    Diabetes Maternal Grandmother    Hypertension Maternal Grandmother    Asthma Maternal Grandmother    Heart attack Maternal Grandfather    Breast cancer Neg Hx    Social History   Socioeconomic History   Marital status: Single    Spouse name: Not on file   Number of children: Not on file   Years of education: Not on file   Highest education level: Not on file  Occupational History   Not on file  Tobacco Use   Smoking status: Never   Smokeless tobacco: Never  Vaping Use   Vaping Use: Never used  Substance and Sexual Activity   Alcohol use: No   Drug use: No   Sexual activity: Not Currently    Birth control/protection: Surgical    Comment: hyst  Other Topics Concern   Not on file  Social History Narrative   Not on file   Social Determinants of Health   Financial Resource Strain: High Risk (09/21/2022)   Overall Financial Resource Strain (CARDIA)    Difficulty of Paying Living Expenses: Very hard  Food Insecurity: Food Insecurity Present (09/21/2022)   Hunger Vital Sign    Worried About Running Out of Food in the Last Year: Sometimes true    Ran Out of Food in the Last Year: Often true  Transportation Needs: Unmet Transportation Needs (09/21/2022)   PRAPARE - Transportation    Lack of  Transportation (Medical): Yes    Lack of Transportation (Non-Medical):  Yes  Physical Activity: Inactive (09/21/2022)   Exercise Vital Sign    Days of Exercise per Week: 0 days    Minutes of Exercise per Session: 0 min  Stress: Stress Concern Present (09/21/2022)   [place]    Feeling of Stress : To some extent  Social Connections: Moderately Integrated (09/21/2022)   Social Connection and Isolation Panel [NHANES]    Frequency of Communication with Friends and Family: More than three times a week    Frequency of Social Gatherings with Friends and Family: More than three times a week    Attends Religious Services: More than 4 times per year    Active Member of [employer] or Organizations: Yes    Attends Music therapist: More than 4 times per year    Marital Status: Never married    Tobacco Counseling Counseling given: Not Answered   Clinical Intake:  Pre-visit preparation completed: Yes  Pain : 0-10 Pain Score: 8  Pain Type: Chronic pain, Other (Comment) (she reports feeling aching) Pain Onset: In the past 7 days Pain Frequency: Constant     Nutritional Risks: None Diabetes: Yes CBG done?: No Did pt. bring in CBG monitor from home?: No  How often do you need to have someone help you when you read instructions, pamphlets, or other written materials from your doctor or pharmacy?: 1 - Never What is the last grade level you completed in school?: some college  Diabetic?yes   Interpreter Needed?: No      Activities of Daily Living    09/21/2022   10:17 AM  In your present state of health, do you have any difficulty performing the following activities:  Hearing? 0  Vision? 0  Difficulty concentrating or making decisions? 0  Walking or climbing stairs? 1  Dressing or bathing? 0  Doing errands, shopping? 0    Patient Care Team: [name], FNP as PCP - General (General Practice) Bensimhon, Shaune Pascal, MD as PCP - Cardiology (Cardiology) Evans Lance, MD as  PCP - Electrophysiology (Cardiology) Lendon Colonel, NP as Nurse Practitioner (Nurse Practitioner) [name], RN as Case Manager Mayford Knife, [facility] (Pharmacist)  Indicate any recent Medical Services you may have received from other than [facility] providers in the past year (date may be approximate).     Assessment:   This is a routine wellness examination for Christine Mcdowell.  Hearing/Vision screen No results found.  Dietary issues and exercise activities discussed:     Goals Addressed   None   Depression Screen    09/21/2022   10:15 AM 09/06/2021    3:11 PM 10/09/2020    2:26 PM 08/30/2020    3:33 PM 04/12/2020    4:08 PM 11/24/2019    3:43 PM 11/24/2019    3:42 PM  PHQ 2/9 Scores  PHQ - 2 Score 0 0 0 0 0 0 0  PHQ- 9 Score   2  4 0     Fall Risk    09/21/2022   10:17 AM 09/06/2021    3:10 PM 08/30/2020    3:32 PM 11/24/2019    3:42 PM 08/24/2019    3:54 PM  [place] in the past year? '1 1 1 '$ 0 1  Comment  tripped over the dog slipped in kitchen, stepped in a hole    Number falls in past yr: 0  0 1  0  Injury with Fall? 0 0 0  0  Risk for fall due to : History of fall(s) Medication side effect Medication side effect    Follow up Falls evaluation completed Falls evaluation completed;Education provided;Falls prevention discussed Falls evaluation completed;Education provided;Falls prevention discussed      FALL RISK PREVENTION PERTAINING TO THE HOME:  Any stairs in or around the home? Yes  If so, are there any without handrails? No  Home free of loose throw rugs in walkways, pet beds, electrical cords, etc? Yes  Adequate lighting in your home to reduce risk of falls? Yes   ASSISTIVE DEVICES UTILIZED TO PREVENT FALLS:  Life alert? No  Use of a cane, walker or w/c? No  Grab bars in the bathroom? Yes  Shower chair or bench in shower? No  Elevated toilet seat or a handicapped toilet? No     Cognitive Function:        09/21/2022   10:18 AM 09/06/2021    3:14  PM 08/30/2020    3:35 PM 08/24/2019    3:55 PM  6CIT Screen  What Year? 0 points 0 points 0 points 0 points  What month? 0 points 0 points 0 points 0 points  What time? 0 points 0 points 3 points 0 points  Count back from 20 0 points 0 points 0 points 0 points  Months in reverse 0 points 0 points 0 points 0 points  Repeat phrase 0 points 0 points 0 points 0 points  Total Score 0 points 0 points 3 points 0 points    Immunizations Immunization History  Administered Date(s) Administered   Influenza, High Dose Seasonal PF 09/02/2018   Influenza, Seasonal, Injecte, Preservative Fre 11/06/2017, 08/24/2019   Influenza,inj,Quad PF,6+ Mos 11/06/2017, 08/24/2019, 08/30/2020, 09/14/2021   Influenza-Unspecified 08/08/2017, 11/06/2017, 08/30/2020, 09/14/2021   PFIZER(Purple Top)SARS-COV-2 Vaccination 06/16/2020, 07/07/2020, 01/01/2021   Pneumococcal Polysaccharide-23 09/14/2021   Pneumococcal-Unspecified 09/14/2021   Tdap 08/24/2019, 08/24/2019   Zoster Recombinat (Shingrix) 02/26/2022, 07/02/2022    TDAP status: Up to date  Flu Vaccine status: Due, Education has been provided regarding the importance of this vaccine. Advised may receive this vaccine at local pharmacy or Health Dept. Aware to provide a copy of the vaccination record if obtained from local pharmacy or Health Dept. Verbalized acceptance and understanding.  Covid-19 vaccine status: Completed vaccines  Qualifies for Shingles Vaccine? Yes   Zostavax completed Yes   Shingrix Completed?: Yes  Screening Tests Health Maintenance  Topic Date Due   COVID-19 Vaccine (4 - Pfizer risk series) 02/26/2021   OPHTHALMOLOGY EXAM  12/12/2021   INFLUENZA VACCINE  07/23/2022   HEMOGLOBIN A1C  08/29/2022   MAMMOGRAM  09/22/2022   Diabetic kidney evaluation - Urine ACR  06/15/2023   FOOT EXAM  07/03/2023   Diabetic kidney evaluation - GFR measurement  08/26/2023   Fecal DNA (Cologuard)  12/18/2024   TETANUS/TDAP  08/23/2029   Hepatitis C  Screening  Completed   HIV Screening  Completed   Zoster Vaccines- Shingrix  Completed   HPV VACCINES  Aged Out   PAP SMEAR-Modifier  Discontinued    Health Maintenance  Health Maintenance Due  Topic Date Due   COVID-19 Vaccine (4 - Pfizer risk series) 02/26/2021   OPHTHALMOLOGY EXAM  12/12/2021   INFLUENZA VACCINE  07/23/2022   HEMOGLOBIN A1C  08/29/2022    Colorectal cancer screening: Type of screening: Cologuard. Completed 12/18/2021. Repeat every 3 years  Mammogram status: Completed 09/22/2020. Repeat every year  Lung Cancer Screening: (Low Dose CT Chest recommended if Age 77-80 years, 30 pack-year currently smoking OR have quit w/in 15years.) does not qualify.   Lung Cancer Screening Referral: n/a  Additional Screening:  Hepatitis C Screening: does qualify; Completed 08/30/2020  Vision Screening: Recommended annual ophthalmology exams for early detection of glaucoma and other disorders of the eye. Is the patient up to date with their annual eye exam?  Yes  Who is the provider or what is the name of the office in which the patient attends annual eye exams? [facility] on [address]  If pt is not established with a provider, would they like to be referred to a provider to establish care? No .   Dental Screening: Recommended annual dental exams for proper oral hygiene  Community Resource Referral / Chronic Care Management: CRR required this visit?  No   CCM required this visit?  No      Plan:     I have personally reviewed and noted the following in the patient's chart:   Medical and social history Use of alcohol, tobacco or illicit drugs  Current medications and supplements including opioid prescriptions. Patient is not currently taking opioid prescriptions. Functional ability and status Nutritional status Physical activity Advanced directives List of other physicians Hospitalizations, surgeries, and ER visits in previous 12 months Vitals Screenings to include  cognitive, depression, and falls Referrals and appointments  In addition, I have reviewed and discussed with patient certain preventive protocols, quality metrics, and best practice recommendations. A written personalized care plan for preventive services as well as general preventive health recommendations were provided to patient.     Debbora Dus, [place]   09/21/2022   Nurse Notes: provider notified.

## 2022-09-21 NOTE — Patient Instructions (Signed)

## 2022-09-23 ENCOUNTER — Ambulatory Visit: Payer: Self-pay

## 2022-09-23 NOTE — Patient Outreach (Signed)
  Care Coordination   09/23/2022 Name: Christine Mcdowell MRN: [id number] DOB: [birth year]   Care Coordination Outreach Attempts:  An unsuccessful telephone outreach was attempted for a scheduled appointment today.  Follow Up Plan:  Additional outreach attempts will be made to offer the patient care coordination information and services.   Encounter Outcome:  Pt. Request to Call Back  Care Coordination Interventions Activated:  No   Care Coordination Interventions:    {THN Tip this will not be part of the note when signed-REQUIRED REPORTNo, not indicated FIELD DO NOT DELETE (Optional):[id number]}  Barb Merino, RN, BSN, CCM Care Management Coordinator [facility] Direct Phone: [phone]

## 2022-09-25 DIAGNOSIS — E119 Type 2 diabetes mellitus without complications: Secondary | ICD-10-CM | POA: Diagnosis not present

## 2022-09-25 DIAGNOSIS — Z794 Long term (current) use of insulin: Secondary | ICD-10-CM | POA: Diagnosis not present

## 2022-09-26 ENCOUNTER — Ambulatory Visit: Payer: Medicare Other

## 2022-09-30 ENCOUNTER — Other Ambulatory Visit: Payer: Self-pay

## 2022-09-30 ENCOUNTER — Other Ambulatory Visit: Payer: Self-pay | Admitting: *Deleted

## 2022-09-30 DIAGNOSIS — R059 Cough, unspecified: Secondary | ICD-10-CM

## 2022-09-30 DIAGNOSIS — J209 Acute bronchitis, unspecified: Secondary | ICD-10-CM

## 2022-09-30 MED ORDER — COMBIVENT RESPIMAT 20-100 MCG/ACT IN AERS: 1.0000 | INHALATION_SPRAY | Freq: Four times a day (QID) | RESPIRATORY_TRACT | 5 refills | Status: AC

## 2022-09-30 MED ORDER — PROAIR HFA 108 (90 BASE) MCG/ACT IN AERS: INHALATION_SPRAY | RESPIRATORY_TRACT | 2 refills | Status: AC

## 2022-09-30 MED ORDER — VITAMIN D (ERGOCALCIFEROL) 1.25 MG (50000 UNIT) PO CAPS: 50000.0000 [IU] | ORAL_CAPSULE | ORAL | 3 refills | Status: AC

## 2022-09-30 MED ORDER — METFORMIN HCL 500 MG PO TABS: 500.0000 mg | ORAL_TABLET | Freq: Two times a day (BID) | ORAL | 1 refills | Status: DC

## 2022-09-30 MED ORDER — BUDESONIDE-FORMOTEROL FUMARATE 80-4.5 MCG/ACT IN AERO: 2.0000 | INHALATION_SPRAY | Freq: Two times a day (BID) | RESPIRATORY_TRACT | 12 refills | Status: DC

## 2022-10-02 ENCOUNTER — Other Ambulatory Visit: Payer: Self-pay

## 2022-10-02 NOTE — Telephone Encounter (Signed)
This is a CHF pt, requesting medications be resent to [facility] mail order pharmacy. Please address

## 2022-10-03 ENCOUNTER — Ambulatory Visit: Payer: Medicare Other | Admitting: Nurse Practitioner

## 2022-10-03 ENCOUNTER — Ambulatory Visit: Payer: Medicare Other

## 2022-10-07 ENCOUNTER — Other Ambulatory Visit: Payer: Self-pay

## 2022-10-07 ENCOUNTER — Inpatient Hospital Stay: Payer: Medicare Other | Attending: Internal Medicine

## 2022-10-07 ENCOUNTER — Inpatient Hospital Stay (HOSPITAL_BASED_OUTPATIENT_CLINIC_OR_DEPARTMENT_OTHER): Payer: Medicare Other | Admitting: Internal Medicine

## 2022-10-07 ENCOUNTER — Encounter: Payer: Self-pay | Admitting: Internal Medicine

## 2022-10-07 VITALS — BP 130/102 | HR 69 | Temp 98.3°F | Resp 15 | Wt 187.8 lb

## 2022-10-07 DIAGNOSIS — D573 Sickle-cell trait: Secondary | ICD-10-CM | POA: Insufficient documentation

## 2022-10-07 DIAGNOSIS — D509 Iron deficiency anemia, unspecified: Secondary | ICD-10-CM | POA: Diagnosis not present

## 2022-10-07 DIAGNOSIS — D539 Nutritional anemia, unspecified: Secondary | ICD-10-CM

## 2022-10-07 DIAGNOSIS — D500 Iron deficiency anemia secondary to blood loss (chronic): Secondary | ICD-10-CM

## 2022-10-07 DIAGNOSIS — Z79899 Other long term (current) drug therapy: Secondary | ICD-10-CM | POA: Insufficient documentation

## 2022-10-07 LAB — CBC WITH DIFFERENTIAL (CANCER CENTER ONLY)
Abs Immature Granulocytes: 0.02 10*3/uL (ref 0.00–0.07)
Basophils Absolute: 0 10*3/uL (ref 0.0–0.1)
Basophils Relative: 0 %
Eosinophils Absolute: 0.1 10*3/uL (ref 0.0–0.5)
Eosinophils Relative: 1 %
HCT: 34.1 % — ABNORMAL LOW (ref 36.0–46.0)
Hemoglobin: 11 g/dL — ABNORMAL LOW (ref 12.0–15.0)
Immature Granulocytes: 0 %
Lymphocytes Relative: 26 %
Lymphs Abs: 1.6 10*3/uL (ref 0.7–4.0)
MCH: 27.8 pg (ref 26.0–34.0)
MCHC: 32.3 g/dL (ref 30.0–36.0)
MCV: 86.3 fL (ref 80.0–100.0)
Monocytes Absolute: 0.6 10*3/uL (ref 0.1–1.0)
Monocytes Relative: 10 %
Neutro Abs: 4 10*3/uL (ref 1.7–7.7)
Neutrophils Relative %: 63 %
Platelet Count: 225 10*3/uL (ref 150–400)
RBC: 3.95 MIL/uL (ref 3.87–5.11)
RDW: 15 % (ref 11.5–15.5)
WBC Count: 6.3 10*3/uL (ref 4.0–10.5)
nRBC: 0 % (ref 0.0–0.2)

## 2022-10-07 LAB — IRON AND IRON BINDING CAPACITY (CC-WL,HP ONLY)
Iron: 89 ug/dL (ref 28–170)
Saturation Ratios: 24 % (ref 10.4–31.8)
TIBC: 370 ug/dL (ref 250–450)
UIBC: 281 ug/dL (ref 148–442)

## 2022-10-07 LAB — FERRITIN: Ferritin: 32 ng/mL (ref 11–307)

## 2022-10-07 NOTE — Progress Notes (Signed)
[place] [phone]   [phone]  OFFICE PROGRESS NOTE  [name], FNP [address] [place] [address]  DIAGNOSIS: Iron deficiency anemia  PRIOR THERAPY: None  CURRENT THERAPY: Multivitamins 1 tablet p.o. daily in addition to folic acid 1 mg p.o. daily.  INTERVAL HISTORY: Christine Mcdowell 51 y.o. female returns to the clinic today for 39-monthfollow-up visit.  The patient is feeling fine today with no concerning complaints [place] for [place]  She denied having any current chest pain, shortness of breath, cough or hemoptysis.  She has no nausea, vomiting, diarrhea or constipation.  She has no headache or visual changes.  She denied having any recent weight loss or night sweats.  She continues to tolerate her treatment with multivitamins and folic acid fairly well.  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   Discoid lupus    Fibromyalgia    History of blood transfusion "several"   "related to anemia; had some w/hysterectomy also"   Hx of cardiovascular stress test    ETT-Myoview (9/15):  No ischemia, EF 52%; NORMAL   Hx of echocardiogram    Echo (9/15):  EF 50-55%, ant HK, Gr 1 DD, mild MR, mild LAE, no effusion   Hypertension    Iron deficiency anemia    h/o iron transfusions   Lupus (systemic lupus erythematosus) ([facility]    Migraine    "a few/year" (07/03/2016)   Pneumonia 12/2015   RA (rheumatoid arthritis) ([facility]    "all over" (07/03/2016)   Sickle cell trait ([facility]    Stroke ([facility] 2014 X 1; 2015 X 2; 2016 X 1;    "right side of face more relaxed than the other; rare speech hesitation"  (07/03/2016)   Vaginal Pap smear, abnormal    ASCUS; HPV    ALLERGIES:  is allergic to hydrocodone-acetaminophen, hydromorphone, iodinated contrast media, other, erythromycin, latex, rinvoq [upadacitinib], farxiga [dapagliflozin], tape, and mircette [desogestrel-ethinyl estradiol].  MEDICATIONS:  Current Outpatient Medications  Medication Sig Dispense Refill   amoxicillin-clavulanate (AUGMENTIN) 500-125 MG tablet Take 1 tablet (500 mg total) by mouth 3 (three) times daily. 21 tablet 0   aspirin 81 MG chewable tablet Chew 162 mg by mouth daily. Taking 2 tablets     atorvastatin (LIPITOR) 10 MG tablet Take 1 tablet (10 mg total) by mouth daily. 90 tablet 2   budesonide-formoterol (SYMBICORT) 80-4.5 MCG/ACT inhaler Inhale 2 puffs into the lungs in the morning and at bedtime. 1 each 12   carvedilol (COREG) 25 MG tablet Take 1 tablet (25 mg total) by mouth 2 (two) times daily with a meal. 60 tablet 6   cetirizine (ZYRTEC ALLERGY) 10 MG tablet Take 1 tablet (10 mg total) by mouth daily. 30 tablet 0   Continuous Blood Gluc Receiver (DEXCOM G6 RECEIVER) DEVI 1 Device by Does not apply route 3 (three) times daily before meals. (Patient not taking: Reported on 09/21/2022) 1 each 1   Continuous Blood Gluc Sensor (DEXCOM G6 SENSOR) MISC Inject 1 each into the skin 3 (three) times daily. (Patient not taking: Reported on 09/21/2022) 3 each 1   Continuous  Blood Gluc Sensor (DEXCOM G7 SENSOR) MISC Use to check blood sugar 3 times day dx code e11.65 9 each 3   Continuous Blood Gluc Transmit (DEXCOM G6 TRANSMITTER) MISC 1 Device by Does not apply route 3 (three) times daily before meals. (Patient not taking: Reported on 09/21/2022) 1 each 1   diclofenac Sodium (VOLTAREN) 1 % GEL apply A SMALL AMOUNT TO involved joints UP TO TWICE DAILY     folic acid (FOLVITE) 1 MG tablet TAKE 1 TABLET BY MOUTH EVERY MORNING 30 tablet 0   furosemide (LASIX) 40 MG tablet Take by mouth.     Glucagon (GVOKE HYPOPEN 2-PACK) 1 MG/0.2ML  SOAJ Inject 1 mg into the skin as needed. 0.2 mL 5   hydrALAZINE (APRESOLINE) 25 MG tablet Take 0.5 tablets (12.5 mg total) by mouth 3 (three) times daily. 135 tablet 3   HYDROcodone bit-homatropine (HYDROMET) 5-1.5 MG/5ML syrup Take 5 mLs by mouth every 6 (six) hours as needed for cough. (Patient not taking: Reported on 09/21/2022) 120 mL 0   hydroxychloroquine (PLAQUENIL) 200 MG tablet Take 1 tablet (200 mg total) by mouth 2 (two) times daily. 180 tablet 0   insulin degludec (TRESIBA FLEXTOUCH) 100 UNIT/ML FlexTouch Pen Inject 10 Units into the skin daily. (Patient taking differently: Inject 15 Units into the skin at bedtime.) 9 mL 1   insulin lispro (HUMALOG KWIKPEN) 100 UNIT/ML KwikPen Use according to sliding scale 15 mL 11   Insulin Pen Needle (NOVOFINE PLUS PEN NEEDLE) 32G X 4 MM MISC Use with insulin pens dx code e11.65 300 each 3   Ipratropium-Albuterol (COMBIVENT RESPIMAT) 20-100 MCG/ACT AERS respimat Inhale 1 puff into the lungs every 6 (six) hours. 4 g 5   ipratropium-albuterol (DUONEB) 0.5-2.5 (3) MG/3ML SOLN Take 3 mLs by nebulization every 6 (six) hours as needed. 360 mL 2   Lactobacillus (ACIDOPHILUS) CAPS capsule Take 1 capsule by mouth daily.     Magnesium 400 MG TABS Take 1 tablet by mouth daily. 90 tablet 1   metFORMIN (GLUCOPHAGE) 500 MG tablet Take 1 tablet (500 mg total) by mouth 2 (two) times daily. 180 tablet 1   methotrexate (RHEUMATREX) 2.5 MG tablet Take 20 mg by mouth every Friday.   3   metolazone (ZAROXOLYN) 2.5 MG tablet TAKE 1 TABLET BY MOUTH AS NEEDED FOR WEIGHT GAIN OF 5 POUNDS WITHIN 3 DAYS AS DIRECTED needs appt for further refills 5 tablet 3   Multiple Vitamin (MULTIVITAMIN WITH MINERALS) TABS Take 1 tablet by mouth every morning.      OLUMIANT tablet Take 2 mg by mouth daily.     ondansetron (ZOFRAN-ODT) 8 MG disintegrating tablet Take 1 tablet (8 mg total) by mouth every 8 (eight) hours as needed for nausea or vomiting. 10 tablet 0   PARoxetine (PAXIL) 10 MG  tablet Take 1 tablet (10 mg total) by mouth every morning. (Patient not taking: Reported on 09/21/2022) 30 tablet 2   potassium chloride (KLOR-CON M) 10 MEQ tablet Take 3 tablets (30 mEq total) by mouth 2 (two) times daily. 180 tablet 3   potassium chloride SA (KLOR-CON M) 20 MEQ tablet Take 1 tablet (20 mEq total) by mouth 2 (two) times daily. 8 tablet 0   predniSONE (DELTASONE) 10 MG tablet Take 1 tablet (10 mg total) by mouth daily. Tapering down to 5 mg every 7 days (Patient taking differently: Take 5 mg by mouth daily.) 30 tablet 1   predniSONE (DELTASONE) 10 MG tablet Take 4 tablets (40 mg total)  by mouth daily. (Patient not taking: Reported on 09/21/2022) 20 tablet 0   PROAIR HFA 108 (90 Base) MCG/ACT inhaler INHALE 2 PUFFS BY MOUTH EVERY DAY AS NEEDED FOR SHORTNESS OF BREATH 18 g 2   sacubitril-valsartan (ENTRESTO) 97-103 MG Take 1 tablet by mouth 2 (two) times daily. 180 tablet 3   spironolactone (ALDACTONE) 25 MG tablet NEEDS FOLLOW UP APPOINTMENT FOR ANYMORE REFILLS TAKE ONE TABLET BY MOUTH EVERY MORNING and TAKE ONE TABLET BY MOUTH EVERY EVENING 60 tablet 1   torsemide (DEMADEX) 20 MG tablet TAKE TWO TABLETS BY MOUTH ONCE DAILY 180 tablet 3   traZODone (DESYREL) 50 MG tablet Take 50 mg by mouth at bedtime as needed.     Vitamin D, Ergocalciferol, (DRISDOL) 1.25 MG (50000 UNIT) CAPS capsule Take 1 capsule (50,000 Units total) by mouth 2 (two) times a week. 12 capsule 3   No current facility-administered medications for this visit.    SURGICAL HISTORY:  Past Surgical History:  Procedure Laterality Date   ABDOMINAL HYSTERECTOMY  2009   ABDOMINAL WOUND DEHISCENCE  2009   BUNIONECTOMY Left 06/15/2020   CARDIAC CATHETERIZATION N/A 10/11/2016   Procedure: Right/Left Heart Cath and Coronary Angiography;  Surgeon: Jolaine Artist, MD;  Location: [facility];  Service: Cardiovascular;  Laterality: N/A;   DILATION AND CURETTAGE OF UTERUS  1991   HEMATOMA EVACUATION  2009   abdomen    ICD IMPLANT  01/28/2018   ICD IMPLANT N/A 01/28/2018   Procedure: ICD IMPLANT;  Surgeon: Evans Lance, MD;  Location: [facility];  Service: Cardiovascular;  Laterality: N/A;   INCISE AND DRAIN ABCESS  2009 X 2   "abdomen after hysterectomy"   KNEE ARTHROSCOPY Right 1997   KNEE SURGERY Right    TUBAL LIGATION  1996    REVIEW OF SYSTEMS:  A comprehensive review of systems was negative except for: Constitutional: positive for fatigue   PHYSICAL EXAMINATION: General appearance: alert, cooperative, fatigued, and no distress Head: Normocephalic, without obvious abnormality, atraumatic Neck: no adenopathy, no JVD, supple, symmetrical, trachea midline, and thyroid not enlarged, symmetric, no tenderness/mass/nodules Lymph nodes: Cervical, supraclavicular, and axillary nodes normal. Resp: clear to auscultation bilaterally Back: symmetric, no curvature. ROM normal. No CVA tenderness. Cardio: regular rate and rhythm, S1, S2 normal, no murmur, click, rub or gallop GI: soft, non-tender; bowel sounds normal; no masses,  no organomegaly Extremities: extremities normal, atraumatic, no cyanosis or edema  ECOG PERFORMANCE STATUS: 1 - Symptomatic but completely ambulatory  Blood pressure (!) 130/102, pulse 69, temperature 98.3 F (36.8 C), temperature source Oral, resp. rate 15, weight 187 lb 12.8 oz (85.2 kg), SpO2 100 %.  LABORATORY DATA: Lab Results  Component Value Date   WBC 6.3 10/07/2022   HGB 11.0 (L) 10/07/2022   HCT 34.1 (L) 10/07/2022   MCV 86.3 10/07/2022   PLT 225 10/07/2022      Chemistry      Component Value Date/Time   NA 140 08/25/2022 1200   NA 145 (H) 02/26/2022 1542   K 2.9 (L) 08/25/2022 1200   CL 100 08/25/2022 1200   CO2 29 08/25/2022 1200   BUN 13 08/25/2022 1200   BUN 13 02/26/2022 1542   CREATININE 0.97 08/25/2022 1200   CREATININE 1.01 (H) 03/21/2022 1252   CREATININE 0.89 10/04/2016 0936      Component Value Date/Time   CALCIUM 8.7 (L)  08/25/2022 1200   ALKPHOS 48 08/25/2022 1200   AST 12 (L) 08/25/2022 1200  AST 12 (L) 03/21/2022 1252   ALT 8 08/25/2022 1200   ALT 10 03/21/2022 1252   BILITOT 0.5 08/25/2022 1200   BILITOT 0.3 03/21/2022 1252       RADIOGRAPHIC STUDIES: No results found.  ASSESSMENT AND PLAN: This is a very pleasant 51 years old African-American female with history of iron deficiency anemia as well as sickle cell trait.  The patient is currently on multivitamins in addition to folic acid and she has been doing fine with no concerning complaints except for mild fatigue. CBC today showed further improvement of her hemoglobin up to 11.0 and hematocrit 34.1%. Iron study and ferritin are still pending. I recommended for the patient to continue her treatment with the multivitamin and folic acid for now. I will see her back for follow-up visit in 3 months for evaluation with repeat blood work. I will arrange for the patient to receive iron infusion if the pending iron studies showed significant deficiency. She was advised to call immediately if she has any other concerning symptoms in the interval. The patient voices understanding of current disease status and treatment options and is in agreement with the current care plan.  All questions were answered. The patient knows to call the clinic with any problems, questions or concerns. We can certainly see the patient much sooner if necessary.  The total time spent in the appointment was 20 minutes.  Disclaimer: This note was dictated with voice recognition software. Similar sounding words can inadvertently be transcribed and may not be corrected upon review.

## 2022-10-11 DIAGNOSIS — E1165 Type 2 diabetes mellitus with hyperglycemia: Secondary | ICD-10-CM | POA: Diagnosis not present

## 2022-10-12 DIAGNOSIS — E1165 Type 2 diabetes mellitus with hyperglycemia: Secondary | ICD-10-CM | POA: Diagnosis not present

## 2022-10-16 ENCOUNTER — Encounter: Payer: Medicare Other | Admitting: Nurse Practitioner

## 2022-10-17 ENCOUNTER — Other Ambulatory Visit (HOSPITAL_COMMUNITY): Payer: Self-pay

## 2022-10-17 DIAGNOSIS — G4733 Obstructive sleep apnea (adult) (pediatric): Secondary | ICD-10-CM | POA: Diagnosis not present

## 2022-10-17 MED ORDER — FUROSEMIDE 40 MG PO TABS: 40.0000 mg | ORAL_TABLET | Freq: Every day | ORAL | 0 refills | Status: DC

## 2022-10-17 MED ORDER — METOLAZONE 2.5 MG PO TABS: ORAL_TABLET | ORAL | 0 refills | Status: AC

## 2022-10-19 ENCOUNTER — Other Ambulatory Visit (HOSPITAL_COMMUNITY): Payer: Self-pay | Admitting: Internal Medicine

## 2022-10-22 ENCOUNTER — Ambulatory Visit (INDEPENDENT_AMBULATORY_CARE_PROVIDER_SITE_OTHER): Payer: Medicare Other | Admitting: Nurse Practitioner

## 2022-10-22 ENCOUNTER — Encounter: Payer: Self-pay | Admitting: Nurse Practitioner

## 2022-10-22 VITALS — BP 106/78 | Temp 98.1°F | Ht 67.0 in | Wt 186.4 lb

## 2022-10-22 DIAGNOSIS — N951 Menopausal and female climacteric states: Secondary | ICD-10-CM | POA: Diagnosis not present

## 2022-10-22 DIAGNOSIS — M069 Rheumatoid arthritis, unspecified: Secondary | ICD-10-CM | POA: Diagnosis not present

## 2022-10-22 DIAGNOSIS — R0981 Nasal congestion: Secondary | ICD-10-CM

## 2022-10-22 DIAGNOSIS — R0989 Other specified symptoms and signs involving the circulatory and respiratory systems: Secondary | ICD-10-CM | POA: Diagnosis not present

## 2022-10-22 DIAGNOSIS — E1165 Type 2 diabetes mellitus with hyperglycemia: Secondary | ICD-10-CM | POA: Diagnosis not present

## 2022-10-22 DIAGNOSIS — I5022 Chronic systolic (congestive) heart failure: Secondary | ICD-10-CM

## 2022-10-22 DIAGNOSIS — I110 Hypertensive heart disease with heart failure: Secondary | ICD-10-CM

## 2022-10-22 DIAGNOSIS — M329 Systemic lupus erythematosus, unspecified: Secondary | ICD-10-CM | POA: Diagnosis not present

## 2022-10-22 DIAGNOSIS — E7800 Pure hypercholesterolemia, unspecified: Secondary | ICD-10-CM

## 2022-10-22 DIAGNOSIS — E876 Hypokalemia: Secondary | ICD-10-CM

## 2022-10-22 DIAGNOSIS — Z1231 Encounter for screening mammogram for malignant neoplasm of breast: Secondary | ICD-10-CM | POA: Diagnosis not present

## 2022-10-22 LAB — POC COVID19 BINAXNOW: SARS Coronavirus 2 Ag: NEGATIVE

## 2022-10-22 MED ORDER — LEVOFLOXACIN 750 MG PO TABS: 750.0000 mg | ORAL_TABLET | Freq: Every day | ORAL | 0 refills | Status: AC

## 2022-10-22 MED ORDER — MOMETASONE FUROATE 50 MCG/ACT NA SUSP: 2.0000 | Freq: Every day | NASAL | 2 refills | Status: DC

## 2022-10-22 MED ORDER — VEOZAH 45 MG PO TABS: 1.0000 | ORAL_TABLET | Freq: Every day | ORAL | 2 refills | Status: DC

## 2022-10-22 NOTE — Progress Notes (Signed)
I,[name],acting as a Education administrator for [name], FNP.,have documented all relevant documentation on the behalf of [name], FNP,as directed by  [name], FNP while in the presence of [name], [place].    Subjective:     Patient ID: Christine Mcdowell , female    DOB: 11-Apr-1971 , 51 y.o.   MRN: [id number]   Chief Complaint  Patient presents with   Diabetes    HPI  Pt presents today for bp & dm f/u. She is trying to stay active and [place] the aquatic center.   While here today patient complains of congestion, runny nose, and headache. She thinks it is a sinus infection, started about a week ago.  She is also interested in an medication for menopause that she would like to discuss. Veoza    Wt Readings from Last 3 Encounters: 10/22/22 : 186 lb 6.4 oz (84.6 kg) 10/07/22 : 187 lb 12.8 oz (85.2 kg) 08/29/22 : 190 lb (86.2 kg)    Diabetes She presents for her follow-up diabetic visit. She has type 2 diabetes mellitus. Pertinent negatives for hypoglycemia include no confusion or nervousness/anxiousness. There are no diabetic associated symptoms. Pertinent negatives for diabetes include no polydipsia, no polyphagia and no polyuria. (She has drank juice to help increase her blood sugars. ) There are no diabetic complications. Risk factors for coronary artery disease include sedentary lifestyle and obesity. Current diabetic treatment includes oral agent (dual therapy) (tresiba (10 units nightly) and metformin - not having spikes - she is not needing the humalog at this time). She is compliant with treatment all of the time. Her weight is stable. She is following a generally healthy diet. When asked about meal planning, she reported none. She has not had a previous visit with a dietitian. She rarely participates in exercise. (Continues with prednisone average in morning 130-138, she is making sure she is up and eating. Not spiking at night. ) An ACE inhibitor/angiotensin II  receptor blocker is being taken. She does not see a podiatrist.Eye exam is not current.  Hypertension This is a chronic problem. The current episode started more than 1 year ago. The problem is controlled. Pertinent negatives include no anxiety. Risk factors for coronary artery disease include obesity and sedentary lifestyle. Past treatments include diuretics and calcium channel blockers.     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   Anemia    Anginal pain (HCC)    Asthma    Cervical cancer ([place])    cervical 1996   CHF (congestive heart failure) ([place])    Diabetes mellitus without complication ([place])    steroid induced   Discoid lupus    Fibromyalgia    History of blood transfusion "several"   "related to anemia; had some w/hysterectomy also"   Hx of cardiovascular stress test    ETT-Myoview (9/15):  No ischemia, EF 52%; NORMAL   Hx of echocardiogram    Echo (9/15):  EF 50-55%, ant HK, Gr 1 DD, mild MR, mild LAE, no effusion   Hypertension    Iron deficiency anemia    h/o iron transfusions   Lupus (systemic lupus erythematosus) ([place])    Migraine    "a few/year" (07/03/2016)   Pneumonia 12/2015   RA (rheumatoid arthritis) ([place])    "all over" (07/03/2016)   Sickle cell trait ([place])    Stroke ([place]) 2014 X 1; 2015 X 2; 2016 X 1;    "right side of face  more relaxed than the other; rare speech hesitation" (07/03/2016)   Vaginal Pap smear, abnormal    ASCUS; HPV     Family History  Problem Relation Age of Onset   Arthritis Mother    Heart murmur Mother    Drug abuse Mother    Allergies Mother    Heart attack Father    Cushing syndrome Father    Depression Father    Allergies Father    Dementia Paternal Grandmother    Cancer Paternal Grandfather    Diabetes Maternal Grandmother    Hypertension Maternal Grandmother    Asthma Maternal Grandmother    Heart attack Maternal Grandfather    Breast cancer Neg Hx       Current Outpatient Medications:    amoxicillin-clavulanate (AUGMENTIN) 500-125 MG tablet, Take 1 tablet (500 mg total) by mouth 3 (three) times daily., Disp: 21 tablet, Rfl: 0   aspirin 81 MG chewable tablet, Chew 162 mg by mouth daily. Taking 2 tablets, Disp: , Rfl:    atorvastatin (LIPITOR) 10 MG tablet, Take 1 tablet (10 mg total) by mouth daily., Disp: 90 tablet, Rfl: 2   budesonide-formoterol (SYMBICORT) 80-4.5 MCG/ACT inhaler, Inhale 2 puffs into the lungs in the morning and at bedtime., Disp: 1 each, Rfl: 12   carvedilol (COREG) 25 MG tablet, TAKE 1 TABLET BY MOUTH EVERY  MORNING AND 1 TABLET BY MOUTH  EVERY EVENING, Disp: 60 tablet, Rfl: 0   cetirizine (ZYRTEC ALLERGY) 10 MG tablet, Take 1 tablet (10 mg total) by mouth daily., Disp: 30 tablet, Rfl: 0   Continuous Blood Gluc Sensor (DEXCOM G7 SENSOR) MISC, Use to check blood sugar 3 times day dx code e11.65, Disp: 9 each, Rfl: 3   diclofenac Sodium (VOLTAREN) 1 % GEL, apply A SMALL AMOUNT TO involved joints UP TO TWICE DAILY, Disp: , Rfl:    ENTRESTO 97-103 MG, TAKE 1 TABLET BY MOUTH TWICE  DAILY, Disp: 60 tablet, Rfl: 0   Fezolinetant (VEOZAH) 45 MG TABS, Take 1 tablet by mouth daily., Disp: 30 tablet, Rfl: 2   folic acid (FOLVITE) 1 MG tablet, TAKE 1 TABLET BY MOUTH EVERY MORNING, Disp: 30 tablet, Rfl: 0   furosemide (LASIX) 40 MG tablet, Take 1 tablet (40 mg total) by mouth daily., Disp: 90 tablet, Rfl: 0   Glucagon (GVOKE HYPOPEN 2-PACK) 1 MG/0.2ML SOAJ, Inject 1 mg into the skin as needed., Disp: 0.2 mL, Rfl: 5   hydrALAZINE (APRESOLINE) 25 MG tablet, Take 0.5 tablets (12.5 mg total) by mouth 3 (three) times daily., Disp: 135 tablet, Rfl: 3   hydroxychloroquine (PLAQUENIL) 200 MG tablet, Take 1 tablet (200 mg total) by mouth 2 (two) times daily., Disp: 180 tablet, Rfl: 0   insulin degludec (TRESIBA FLEXTOUCH) 100 UNIT/ML FlexTouch Pen, Inject 10 Units into the skin daily. (Patient taking differently: Inject 15 Units into the skin  at bedtime.), Disp: 9 mL, Rfl: 1   insulin lispro (HUMALOG KWIKPEN) 100 UNIT/ML KwikPen, Use according to sliding scale, Disp: 15 mL, Rfl: 11   Insulin Pen Needle (NOVOFINE PLUS PEN NEEDLE) 32G X 4 MM MISC, Use with insulin pens dx code e11.65, Disp: 300 each, Rfl: 3   Ipratropium-Albuterol (COMBIVENT RESPIMAT) 20-100 MCG/ACT AERS respimat, Inhale 1 puff into the lungs every 6 (six) hours., Disp: 4 g, Rfl: 5   ipratropium-albuterol (DUONEB) 0.5-2.5 (3) MG/3ML SOLN, Take 3 mLs by nebulization every 6 (six) hours as needed., Disp: 360 mL, Rfl: 2   Lactobacillus (ACIDOPHILUS) CAPS capsule, Take 1 capsule  by mouth daily., Disp: , Rfl:    Magnesium 400 MG TABS, Take 1 tablet by mouth daily., Disp: 90 tablet, Rfl: 1   metFORMIN (GLUCOPHAGE) 500 MG tablet, Take 1 tablet (500 mg total) by mouth 2 (two) times daily., Disp: 180 tablet, Rfl: 1   methotrexate (RHEUMATREX) 2.5 MG tablet, Take 20 mg by mouth every Friday. , Disp: , Rfl: 3   metolazone (ZAROXOLYN) 2.5 MG tablet, TAKE 1 TABLET BY MOUTH AS NEEDED FOR WEIGHT GAIN OF 5 POUNDS WITHIN 3 DAYS AS DIRECTED needs appt for further refills, Disp: 5 tablet, Rfl: 0   mometasone (NASONEX) 50 MCG/ACT nasal spray, Place 2 sprays into the nose daily., Disp: 1 each, Rfl: 2   Multiple Vitamin (MULTIVITAMIN WITH MINERALS) TABS, Take 1 tablet by mouth every morning. , Disp: , Rfl:    OLUMIANT tablet, Take 2 mg by mouth daily., Disp: , Rfl:    ondansetron (ZOFRAN-ODT) 8 MG disintegrating tablet, Take 1 tablet (8 mg total) by mouth every 8 (eight) hours as needed for nausea or vomiting., Disp: 10 tablet, Rfl: 0   potassium chloride SA (KLOR-CON M) 20 MEQ tablet, Take 1 tablet (20 mEq total) by mouth 2 (two) times daily., Disp: 8 tablet, Rfl: 0   predniSONE (DELTASONE) 10 MG tablet, Take 1 tablet (10 mg total) by mouth daily. Tapering down to 5 mg every 7 days, Disp: 30 tablet, Rfl: 1   PROAIR HFA 108 (90 Base) MCG/ACT inhaler, INHALE 2 PUFFS BY MOUTH EVERY DAY AS NEEDED  FOR SHORTNESS OF BREATH, Disp: 18 g, Rfl: 2   spironolactone (ALDACTONE) 25 MG tablet, TAKE 1 TABLET BY MOUTH EVERY  MORNING AND 1 TABLET BY MOUTH  EVERY EVENING NEEDS FOLLOW UP  APPT BEFORE ANYMORE REFILLS, Disp: 60 tablet, Rfl: 0   torsemide (DEMADEX) 20 MG tablet, TAKE TWO TABLETS BY MOUTH ONCE DAILY, Disp: 180 tablet, Rfl: 3   traZODone (DESYREL) 50 MG tablet, Take 50 mg by mouth at bedtime as needed., Disp: , Rfl:    Vitamin D, Ergocalciferol, (DRISDOL) 1.25 MG (50000 UNIT) CAPS capsule, Take 1 capsule (50,000 Units total) by mouth 2 (two) times a week., Disp: 12 capsule, Rfl: 3   Continuous Blood Gluc Receiver (DEXCOM G6 RECEIVER) DEVI, 1 Device by Does not apply route 3 (three) times daily before meals. (Patient not taking: Reported on 09/21/2022), Disp: 1 each, Rfl: 1   Continuous Blood Gluc Sensor (DEXCOM G6 SENSOR) MISC, Inject 1 each into the skin 3 (three) times daily. (Patient not taking: Reported on 09/21/2022), Disp: 3 each, Rfl: 1   Continuous Blood Gluc Transmit (DEXCOM G6 TRANSMITTER) MISC, 1 Device by Does not apply route 3 (three) times daily before meals. (Patient not taking: Reported on 09/21/2022), Disp: 1 each, Rfl: 1   HYDROcodone bit-homatropine (HYDROMET) 5-1.5 MG/5ML syrup, Take 5 mLs by mouth every 6 (six) hours as needed for cough. (Patient not taking: Reported on 10/22/2022), Disp: 120 mL, Rfl: 0   PARoxetine (PAXIL) 10 MG tablet, Take 1 tablet (10 mg total) by mouth every morning. (Patient not taking: Reported on 09/21/2022), Disp: 30 tablet, Rfl: 2   potassium chloride (KLOR-CON M) 10 MEQ tablet, TAKE 3 TABLETS BY MOUTH TWICE  DAILY, Disp: 360 tablet, Rfl: 5   predniSONE (DELTASONE) 10 MG tablet, Take 4 tablets (40 mg total) by mouth daily. (Patient not taking: Reported on 09/21/2022), Disp: 20 tablet, Rfl: 0   Allergies  Allergen Reactions   Hydrocodone-Acetaminophen Nausea And Vomiting   Hydromorphone Nausea  And Vomiting    Other reaction(s): GI Upset (intolerance),  Hypertension (intolerance) Raises blood pressure  Other reaction(s): GI Upset (intolerance), Hypertension (intolerance) Raises blood pressure to stroke level   Iodinated Contrast Media Other (See Comments)    Shuts down kidneys Shuts kidney function down   Other Other (See Comments) and Anaphylaxis    Spicy foods and seasonings Skin Prep "makes my skin peel off" Paper tape causes skin burns   Erythromycin Nausea And Vomiting   Latex Hives   Rinvoq [Upadacitinib] Other (See Comments)   Farxiga [Dapagliflozin] Other (See Comments)   Tape Other (See Comments)    "Skin burns"   Mircette [Desogestrel-Ethinyl Estradiol] Nausea And Vomiting and Rash     Review of Systems  Constitutional: Negative.   Respiratory: Negative.    Cardiovascular: Negative.   Endocrine: Negative for polydipsia, polyphagia and polyuria.  Neurological: Negative.   Psychiatric/Behavioral: Negative.  Negative for confusion. The patient is not nervous/anxious.      Today's Vitals   10/22/22 1120  BP: 106/78  Temp: 98.1 F (36.7 C)  SpO2: 98%  Weight: 186 lb 6.4 oz (84.6 kg)  Height: '5\' 7"'$  (1.702 m)  PainSc: 0-No pain   Body mass index is 29.19 kg/m.   Objective:  Physical Exam Vitals reviewed.  Constitutional:      General: She is not in acute distress.    Appearance: Normal appearance.  Cardiovascular:     Rate and Rhythm: Normal rate and regular rhythm.     Pulses: Normal pulses.     Heart sounds: No murmur heard. Pulmonary:     Effort: Pulmonary effort is normal. No respiratory distress.     Breath sounds: Normal breath sounds. No wheezing.  Skin:    General: Skin is warm and dry.     Capillary Refill: Capillary refill takes less than 2 seconds.     Findings: No bruising.  Neurological:     General: No focal deficit present.     Mental Status: She is alert and oriented to person, place, and time.     Cranial Nerves: No cranial nerve deficit.     Motor: No weakness.  Psychiatric:         Mood and Affect: Mood normal.        Behavior: Behavior normal.        Thought Content: Thought content normal.        Judgment: Judgment normal.         Assessment And Plan:     1. Type 2 diabetes mellitus with hyperglycemia, without long-term current use of insulin (HCC) Comments: HgbA1c has been stable, continue current medications. - POCT UA - Microalbumin - Microalbumin / creatinine urine ratio - Hemoglobin A1c  2. Hypertensive heart disease with chronic systolic congestive heart failure ([place]) Comments: Blood pressure is well controlled. Continue current medications and f/u with Cardiology  3. Pure hypercholesterolemia Comments: Continue statin, tolerating well.  4. Systemic lupus erythematosus, unspecified SLE type, unspecified organ involvement status ([place]) Comments: Continue f/u with Rheumatology  5. Rheumatoid arthritis, involving unspecified site, unspecified whether rheumatoid factor present ([place])  6. Hypokalemia Comments: Will recheck levels and change medications pending results  7. Nasal congestion Comments: This has been persistent. Rx nasonex and will treat with levaquin for possible sinus infection. I have disussed with her about risk for antibiotic resistance - POC COVID-19 - mometasone (NASONEX) 50 MCG/ACT nasal spray; Place 2 sprays into the nose daily.  Dispense: 1 each; Refill: 2 - levofloxacin (  LEVAQUIN) 750 MG tablet; Take 1 tablet (750 mg total) by mouth daily for 10 days.  Dispense: 10 tablet; Refill: 0  8. Runny nose - POC COVID-19 - mometasone (NASONEX) 50 MCG/ACT nasal spray; Place 2 sprays into the nose daily.  Dispense: 1 each; Refill: 2 - levofloxacin (LEVAQUIN) 750 MG tablet; Take 1 tablet (750 mg total) by mouth daily for 10 days.  Dispense: 10 tablet; Refill: 0  9. Encounter for screening mammogram for breast cancer Pt instructed on Self Breast Exam.According to ACOG guidelines Women aged 28 and older are recommended to get an annual  mammogram. Form completed and given to patient contact the [facility] for appointment scheduing.  Pt encouraged to get annual mammogram - MM DIAG BREAST TOMO BILATERAL; Future  10. Hot flashes due to menopause Comments: will try her on veozah - samples given and sent Rx to pharmacy - Fezolinetant (VEOZAH) 45 MG TABS; Take 1 tablet by mouth daily.  Dispense: 30 tablet; Refill: 2     Patient was given opportunity to ask questions. Patient verbalized understanding of the plan and was able to repeat key elements of the plan. All questions were answered to their satisfaction.  [name], FNP    I, [name], FNP, have reviewed all documentation for this visit. The documentation on 10/22/22 for the exam, diagnosis, procedures, and orders are all accurate and complete.   IF YOU HAVE BEEN REFERRED TO A SPECIALIST, IT MAY TAKE 1-2 WEEKS TO SCHEDULE/PROCESS THE REFERRAL. IF YOU HAVE NOT HEARD FROM US/SPECIALIST IN TWO WEEKS, PLEASE GIVE [place] A CALL AT [phone].   THE PATIENT IS ENCOURAGED TO PRACTICE SOCIAL DISTANCING DUE TO THE COVID-19 PANDEMIC.

## 2022-10-23 ENCOUNTER — Other Ambulatory Visit: Payer: Self-pay | Admitting: Nurse Practitioner

## 2022-10-23 ENCOUNTER — Ambulatory Visit: Payer: Self-pay

## 2022-10-23 DIAGNOSIS — Z1231 Encounter for screening mammogram for malignant neoplasm of breast: Secondary | ICD-10-CM

## 2022-10-23 LAB — HEMOGLOBIN A1C
Est. average glucose Bld gHb Est-mCnc: 151 mg/dL
Hgb A1c MFr Bld: 6.9 % — ABNORMAL HIGH (ref 4.8–5.6)

## 2022-10-23 LAB — MICROALBUMIN / CREATININE URINE RATIO
Creatinine, Urine: 69.9 mg/dL
Microalb/Creat Ratio: 4 mg/g creat (ref 0–29)
Microalbumin, Urine: 3 ug/mL

## 2022-10-23 NOTE — Patient Outreach (Signed)
  Care Coordination   Follow Up Visit Note   10/23/2022 Name: Christine Mcdowell MRN: [id number] DOB: [birth year]  Christine Mcdowell is a 51 y.o. year old female who sees [name], [place] for primary care. I spoke with  Christine Mcdowell by phone today.  What matters to the patients health and wellness today?  Patient would like to f/u with an ENT for evaluation of chronic sinus symptoms.     Goals Addressed               This Visit's Progress     Patient Stated     To recover from [place] 19 (pt-stated)        Care Coordination Interventions: Determined patient continues to have symptoms suggestive of chronic sinusitis Discussed patient reported symptoms to her PCP yesterday, 10/22/22  Reviewed medications with patient and discussed importance of medication adherence, patient continues to have her MTX on hold per Rheumatologist  Determined patient would like to see an ENT to help diagnose/manage chronic sinus symptoms  Collaborated with PCP with request for an ENT referral         SDOH assessments and interventions completed:  No     Care Coordination Interventions Activated:  Yes  Care Coordination Interventions:  Yes, provided   Follow up plan: Follow up call scheduled for 01/29/23 '@0930'$  AM    Encounter Outcome:  Pt. Visit Completed

## 2022-10-23 NOTE — Patient Instructions (Signed)
Visit Information  Thank you for taking time to visit with me today. Please don't hesitate to contact me if I can be of assistance to you.   Following are the goals we discussed today:   Goals Addressed               This Visit's Progress     Patient Stated     To recover from [place] 19 (pt-stated)        Care Coordination Interventions: Determined patient continues to have symptoms suggestive of chronic sinusitis Discussed patient reported symptoms to her PCP yesterday, 10/22/22  Reviewed medications with patient and discussed importance of medication adherence, patient continues to have her MTX on hold per Rheumatologist  Determined patient would like to see an ENT to help diagnose/manage chronic sinus symptoms  Collaborated with PCP with request for an ENT referral         Our next appointment is by telephone on 01/29/23 at 0930 AM  Please call the care guide team at [phone] if you need to cancel or reschedule your appointment.   If you are experiencing a Mental Health or [place] or need someone to talk to, please call [phone]-TALK (toll free, 24 hour hotline)  Patient verbalizes understanding of instructions and care plan provided today and agrees to view in [place]. Active MyChart status and patient understanding of how to access instructions and care plan via MyChart confirmed with patient.     Barb Merino, RN, BSN, CCM Care Management Coordinator [facility] Care Management Direct Phone: [phone]

## 2022-10-29 ENCOUNTER — Other Ambulatory Visit: Payer: Self-pay | Admitting: Nurse Practitioner

## 2022-10-31 ENCOUNTER — Encounter: Payer: Self-pay | Admitting: Nurse Practitioner

## 2022-10-31 DIAGNOSIS — I7300 Raynaud's syndrome without gangrene: Secondary | ICD-10-CM | POA: Diagnosis not present

## 2022-10-31 DIAGNOSIS — M0579 Rheumatoid arthritis with rheumatoid factor of multiple sites without organ or systems involvement: Secondary | ICD-10-CM | POA: Diagnosis not present

## 2022-10-31 DIAGNOSIS — M25511 Pain in right shoulder: Secondary | ICD-10-CM | POA: Diagnosis not present

## 2022-10-31 DIAGNOSIS — Z8261 Family history of arthritis: Secondary | ICD-10-CM | POA: Diagnosis not present

## 2022-10-31 DIAGNOSIS — Z79899 Other long term (current) drug therapy: Secondary | ICD-10-CM | POA: Diagnosis not present

## 2022-10-31 DIAGNOSIS — M25512 Pain in left shoulder: Secondary | ICD-10-CM | POA: Diagnosis not present

## 2022-10-31 DIAGNOSIS — Z7952 Long term (current) use of systemic steroids: Secondary | ICD-10-CM | POA: Diagnosis not present

## 2022-10-31 DIAGNOSIS — M351 Other overlap syndromes: Secondary | ICD-10-CM | POA: Diagnosis not present

## 2022-10-31 DIAGNOSIS — M25522 Pain in left elbow: Secondary | ICD-10-CM | POA: Diagnosis not present

## 2022-10-31 DIAGNOSIS — Z8679 Personal history of other diseases of the circulatory system: Secondary | ICD-10-CM | POA: Diagnosis not present

## 2022-10-31 DIAGNOSIS — M7989 Other specified soft tissue disorders: Secondary | ICD-10-CM | POA: Diagnosis not present

## 2022-10-31 DIAGNOSIS — M159 Polyosteoarthritis, unspecified: Secondary | ICD-10-CM | POA: Diagnosis not present

## 2022-10-31 DIAGNOSIS — M329 Systemic lupus erythematosus, unspecified: Secondary | ICD-10-CM | POA: Diagnosis not present

## 2022-10-31 DIAGNOSIS — I110 Hypertensive heart disease with heart failure: Secondary | ICD-10-CM | POA: Diagnosis not present

## 2022-10-31 DIAGNOSIS — Z79631 Long term (current) use of antimetabolite agent: Secondary | ICD-10-CM | POA: Diagnosis not present

## 2022-11-03 ENCOUNTER — Other Ambulatory Visit: Payer: Self-pay | Admitting: Nurse Practitioner

## 2022-11-03 DIAGNOSIS — R0981 Nasal congestion: Secondary | ICD-10-CM

## 2022-11-07 ENCOUNTER — Ambulatory Visit
Admission: RE | Admit: 2022-11-07 | Discharge: 2022-11-07 | Disposition: A | Discharge status: Home / Self Care | Payer: Medicare Other | Source: Ambulatory Visit | Attending: Nurse Practitioner | Admitting: Nurse Practitioner

## 2022-11-07 ENCOUNTER — Ambulatory Visit: Payer: Medicare Other

## 2022-11-07 DIAGNOSIS — R921 Mammographic calcification found on diagnostic imaging of breast: Secondary | ICD-10-CM | POA: Diagnosis not present

## 2022-11-07 DIAGNOSIS — Z1231 Encounter for screening mammogram for malignant neoplasm of breast: Secondary | ICD-10-CM

## 2022-11-12 DIAGNOSIS — E1165 Type 2 diabetes mellitus with hyperglycemia: Secondary | ICD-10-CM | POA: Diagnosis not present

## 2022-11-14 ENCOUNTER — Other Ambulatory Visit (HOSPITAL_COMMUNITY): Payer: Self-pay | Admitting: Internal Medicine

## 2022-11-18 ENCOUNTER — Other Ambulatory Visit: Payer: Self-pay | Admitting: Nurse Practitioner

## 2022-11-27 ENCOUNTER — Ambulatory Visit (INDEPENDENT_AMBULATORY_CARE_PROVIDER_SITE_OTHER): Payer: Medicare Other

## 2022-11-27 DIAGNOSIS — I420 Dilated cardiomyopathy: Secondary | ICD-10-CM

## 2022-11-27 LAB — CUP PACEART REMOTE DEVICE CHECK
Battery Remaining Longevity: 138 mo
Battery Remaining Percentage: 94 %
Brady Statistic RV Percent Paced: 0 %
Date Time Interrogation Session: 20231206045000
HighPow Impedance: 76 Ohm
Implantable Lead Connection Status: 753985
Implantable Lead Implant Date: 20190206
Implantable Lead Location: 753860
Implantable Lead Model: 292
Implantable Lead Serial Number: 438194
Implantable Pulse Generator Implant Date: 20190206
Lead Channel Impedance Value: 570 Ohm
Lead Channel Setting Pacing Amplitude: 2.5 V
Lead Channel Setting Pacing Pulse Width: 0.4 ms
Lead Channel Setting Sensing Sensitivity: 0.5 mV
Pulse Gen Serial Number: 243572
Zone Setting Status: 755011

## 2022-12-01 ENCOUNTER — Encounter: Payer: Self-pay | Admitting: Nurse Practitioner

## 2022-12-02 ENCOUNTER — Other Ambulatory Visit: Payer: Self-pay | Admitting: Nurse Practitioner

## 2022-12-02 ENCOUNTER — Other Ambulatory Visit: Payer: Self-pay

## 2022-12-02 ENCOUNTER — Other Ambulatory Visit (HOSPITAL_COMMUNITY): Payer: Self-pay | Admitting: Internal Medicine

## 2022-12-02 ENCOUNTER — Ambulatory Visit: Payer: Medicare Other | Admitting: Nurse Practitioner

## 2022-12-02 DIAGNOSIS — N951 Menopausal and female climacteric states: Secondary | ICD-10-CM

## 2022-12-02 MED ORDER — VEOZAH 45 MG PO TABS: 1.0000 | ORAL_TABLET | Freq: Every day | ORAL | 2 refills | Status: DC

## 2022-12-03 ENCOUNTER — Telehealth: Payer: Self-pay

## 2022-12-03 NOTE — Chronic Care Management (AMB) (Signed)
    Called [name], No answer, left message of appointment on 12-04-2022 at 2:00 via telephone visit with Orlando Penner, Pharm D. Notified to have all medications, supplements, blood pressure and/or blood sugar logs available during appointment and to return call if need to reschedule.   Care Gaps: Covid booster overdue Flu vaccine overdue Ophthalmology exam overdue  Star Rating Drug: Atorvastatin 10 mg- Last filled 10-08-2022 100 DS [facility] Metformin 500 mg- Last filled 09-13-2022 30 DS [facility] Entresto 97-103 mg- Last filled 11-24-2022 [facility]  Any gaps in medications fill history? Yes  [place] Pharmacist Assistant [phone]

## 2022-12-04 ENCOUNTER — Telehealth: Payer: Self-pay

## 2022-12-05 ENCOUNTER — Encounter: Payer: Self-pay | Admitting: Nurse Practitioner

## 2022-12-06 ENCOUNTER — Telehealth: Payer: Self-pay

## 2022-12-06 NOTE — Progress Notes (Incomplete)
Current Barriers:  {pharmacybarriers:24917}  Pharmacist Clinical Goal(s):  Patient will {PHARMACYGOALCHOICES:24921} through collaboration with PharmD and provider.   Interventions: 1:1 collaboration with [name], FNP regarding development and update of comprehensive plan of care as evidenced by provider attestation and co-signature Inter-disciplinary care team collaboration (see longitudinal plan of care) Comprehensive medication review performed; medication list updated in electronic medical record  {CCM [facility] DISEASE STATES:25130}  Patient Goals/Self-Care Activities Patient will:  - {pharmacypatientgoals:24919}  Follow Up Plan: {CM FOLLOW UP RKYH:06237}  Chronic Care Management Pharmacy Note  12/06/2022 Name:  Christine Mcdowell MRN:  [id number] DOB:  May 07, 1971  Summary: ***  Recommendations/Changes made from today's visit: ***  Plan: ***   Subjective: Christine Mcdowell is an 51 y.o. year old female who is a primary patient of [name], [place].  The CCM team was consulted for assistance with disease management and care coordination needs.    {CCMTELEPHONEFACETOFACE:21091510} for {CCMINITIALFOLLOWUPCHOICE:21091511} in response to provider referral for pharmacy case management and/or care coordination services.   Consent to Services:  {CCMCONSENTOPTIONS:25074}  Patient Care Team: [name], FNP as PCP - General (General Practice) Bensimhon, Shaune Pascal, MD as PCP - Cardiology (Cardiology) Evans Lance, MD as PCP - Electrophysiology (Cardiology) Lendon Colonel, NP as Nurse Practitioner (Nurse Practitioner) [name], RN as Case Manager Mayford Knife, [facility] (Pharmacist)  Recent office visits: ***  Recent consult visits: [facility] visits: {Hospital DC Yes/No:25215}   Objective:  Lab Results  Component Value Date   CREATININE 0.97 08/25/2022   BUN 13 08/25/2022   GFR 86.64 02/28/2015   EGFR 62 02/26/2022   GFRNONAA >60  08/25/2022   GFRAA 74 08/30/2020   NA 140 08/25/2022   K 2.9 (L) 08/25/2022   CALCIUM 8.7 (L) 08/25/2022   CO2 29 08/25/2022   GLUCOSE 130 (H) 08/25/2022    Lab Results  Component Value Date/Time   HGBA1C 6.9 (H) 10/22/2022 12:30 PM   HGBA1C 7.0 (H) 02/26/2022 03:42 PM   GFR 86.64 02/28/2015 10:05 AM   GFR 91.33 02/03/2015 10:33 AM   MICROALBUR 10 08/24/2019 06:06 PM    Last diabetic Eye exam:  Lab Results  Component Value Date/Time   HMDIABEYEEXA No Retinopathy 08/26/2018 12:00 AM    Last diabetic Foot exam: No results found for: "HMDIABFOOTEX"   Lab Results  Component Value Date   CHOL 188 02/26/2022   HDL 48 02/26/2022   LDLCALC 120 (H) 02/26/2022   TRIG 108 02/26/2022   CHOLHDL 3.9 02/26/2022       Latest Ref Rng & Units 08/25/2022   12:00 PM 03/21/2022   12:52 PM 10/11/2021   12:55 PM  Hepatic Function  Total Protein 6.5 - 8.1 g/dL 7.2  7.4  7.4   Albumin 3.5 - 5.0 g/dL 4.4  4.5  4.9   AST 15 - 41 U/L _0 ALT 0 - 44 U/L _1 Alk Phosphatase 38 - 126 U/L 48  48  68   Total Bilirubin 0.3 - 1.2 mg/dL 0.5  0.3  0.3     Lab Results  Component Value Date/Time   TSH 0.652 04/12/2020 05:31 PM   TSH 0.750 03/29/2019 03:46 PM       Latest Ref Rng & Units 10/07/2022    8:54 AM 08/25/2022   12:00 PM 06/20/2022   10:03 AM  CBC  WBC 4.0 - 10.5 K/uL 6.3  7.6  5.2   Hemoglobin 12.0 -  15.0 g/dL 11.0  9.9  10.1   Hematocrit 36.0 - 46.0 % 34.1  30.1  31.2   Platelets 150 - 400 K/uL 225  238  272     Lab Results  Component Value Date/Time   VD25OH 75.8 08/30/2020 05:29 PM   VD25OH 43.2 04/12/2020 05:31 PM   VITAMINB12 2,127 (H) 03/21/2022 12:52 PM    Clinical ASCVD: {YES/NO:21197} The 10-year ASCVD risk score (Arnett DK, et al., 2019) is: 9.8%   Values used to calculate the score:     Age: 23 years     Sex: Female     Is Non-Hispanic African American: Yes     Diabetic: Yes     Tobacco smoker: No     Systolic Blood Pressure: 098 mmHg     Is  BP treated: Yes     HDL Cholesterol: 48 mg/dL     Total Cholesterol: 188 mg/dL       09/21/2022   10:15 AM 09/06/2021    3:11 PM 10/09/2020    2:26 PM  Depression screen PHQ 2/9  Decreased Interest 0 0 0  Down, Depressed, Hopeless 0 0 0  PHQ - 2 Score 0 0 0  Altered sleeping   2  Tired, decreased energy   0  Change in appetite   0  Feeling bad or failure about yourself    0  Trouble concentrating   0  Moving slowly or fidgety/restless   0  Suicidal thoughts   0  PHQ-9 Score   2  Difficult doing work/chores   Not difficult at all     ***Other: (CHADS2VASc if Afib, MMRC or CAT for COPD, ACT, DEXA)  Social History   Tobacco Use  Smoking Status Never  Smokeless Tobacco Never   BP Readings from Last 3 Encounters:  10/22/22 106/78  10/07/22 (!) 130/102  08/29/22 (!) 147/97   Pulse Readings from Last 3 Encounters:  10/07/22 69  08/29/22 (!) 101  08/25/22 74   Wt Readings from Last 3 Encounters:  10/22/22 186 lb 6.4 oz (84.6 kg)  10/07/22 187 lb 12.8 oz (85.2 kg)  08/29/22 190 lb (86.2 kg)   BMI Readings from Last 3 Encounters:  10/22/22 29.19 kg/m  10/07/22 29.41 kg/m  08/29/22 29.76 kg/m    Assessment/Interventions: Review of patient past medical history, allergies, medications, health status, including review of consultants reports, laboratory and other test data, was performed as part of comprehensive evaluation and provision of chronic care management services.   SDOH:  (Social Determinants of Health) assessments and interventions performed: {yes/no:20286} SDOH Interventions    Flowsheet Row Clinical Support from 09/21/2022 in [place] [facility] from 08/30/2021 in [place] Management from 10/04/2020 in [place] [facility] from 08/30/2020 in [place] Management from 08/02/2020 in [place] [facility] from 07/20/2020 in [facility]  SDOH Interventions        Food Insecurity Interventions Patient Refused -- -- -- -- --  Housing Interventions Patient Refused Other (Comment)  [helping to connect with Section [employer]] Other (Comment)  [Provided website to apply for financial assistance from [employer] -- -- [id number] Referral  [Will refer for utility assistance on 8/2 as most agencies are out of funds by the end of the month]  Transportation Interventions Other (Comment)  [patient has alreay spoke with Education officer, museum. she  reports not getting resources for housing] -- -- -- -- --  Utilities Interventions Patient Refused -- -- -- -- --  Alcohol Usage Interventions Intervention Not Indicated (Score <7) -- -- -- -- --  Financial Strain Interventions Patient Refused Other (Comment)  [will help connect pt to resources/patient care fund if she finds available apartment] -- Intervention Not Indicated  [Medications are free] Intervention Not Indicated  [Medications are free] --  Physical Activity Interventions Intervention Not Indicated -- -- -- -- --  Stress Interventions Patient Refused -- -- -- -- --  Social Connections Interventions Other (Comment) -- -- -- -- --      SDOH Screenings   Food Insecurity: Food Insecurity Present (09/21/2022)  Housing: High Risk (09/21/2022)  Transportation Needs: Unmet Transportation Needs (09/21/2022)  Utilities: At Risk (09/21/2022)  Alcohol Screen: Low Risk  (09/21/2022)  Depression (PHQ2-9): Low Risk  (09/21/2022)  Financial Resource Strain: High Risk (09/21/2022)  Physical Activity: Inactive (09/21/2022)  Social Connections: Moderately Integrated (09/21/2022)  Stress: Stress Concern Present (09/21/2022)  Tobacco Use: Low Risk  (10/22/2022)    CCM Care Plan  Allergies  Allergen Reactions   Hydrocodone-Acetaminophen Nausea And Vomiting   Hydromorphone Nausea And Vomiting    Other reaction(s):  GI Upset (intolerance), Hypertension (intolerance) Raises blood pressure  Other reaction(s): GI Upset (intolerance), Hypertension (intolerance) Raises blood pressure to stroke level   Iodinated Contrast Media Other (See Comments)    Shuts down kidneys Shuts kidney function down   Other Other (See Comments) and Anaphylaxis    Spicy foods and seasonings Skin Prep "makes my skin peel off" Paper tape causes skin burns   Erythromycin Nausea And Vomiting   Latex Hives   Rinvoq [Upadacitinib] Other (See Comments)   Farxiga [Dapagliflozin] Other (See Comments)   Tape Other (See Comments)    "Skin burns"   Mircette [Desogestrel-Ethinyl Estradiol] Nausea And Vomiting and Rash    Medications Reviewed Today     Reviewed by [name], RN (Registered Nurse) on 10/23/22 at [facility] [place] List Status: <None>   Medication Order Taking? Sig Documenting Provider Last Dose Status Informant  amoxicillin-clavulanate (AUGMENTIN) 500-125 MG tablet [id number] No Take 1 tablet (500 mg total) by mouth 3 (three) times daily. [name], FNP Taking Active   aspirin 81 MG chewable tablet [id number] No Chew 162 mg by mouth daily. Taking 2 tablets [provider] Taking Active Self  atorvastatin (LIPITOR) 10 MG tablet [id number] No Take 1 tablet (10 mg total) by mouth daily. [name], FNP Taking Active   budesonide-formoterol [facility]) 80-4.5 MCG/ACT inhaler [id number] No Inhale 2 puffs into the lungs in the morning and at bedtime. [name], FNP Taking Active   carvedilol (COREG) 25 MG tablet [id number] No TAKE 1 TABLET BY MOUTH EVERY  MORNING AND 1 TABLET BY MOUTH  EVERY EVENING Bensimhon, Shaune Pascal, MD Taking Active   cetirizine (ZYRTEC ALLERGY) 10 MG tablet [id number] No Take 1 tablet (10 mg total) by mouth daily. [name], MD Taking Active   Continuous Blood Gluc Receiver (DEXCOM G6 RECEIVER) DEVI [id number] No 1 Device by Does not apply route 3 (three) times daily before meals.  Patient  not taking: Reported on 09/21/2022   [name], FNP Not Taking Active   Continuous Blood Gluc Sensor (DEXCOM G6 SENSOR) MISC [id number] No Inject 1 each into the skin 3 (three) times daily.  Patient not taking: Reported on 09/21/2022   [name], FNP Not Taking Active   Continuous Blood Gluc Sensor [facility]  G7 SENSOR) [place] [id number] No Use to check blood sugar 3 times day dx code e11.65 [name], FNP Taking Active   Continuous Blood Gluc Transmit (DEXCOM G6 TRANSMITTER) MISC [id number] No 1 Device by Does not apply route 3 (three) times daily before meals.  Patient not taking: Reported on 09/21/2022   [name], FNP Not Taking Active   diclofenac Sodium (VOLTAREN) 1 % GEL [id number] No apply A SMALL AMOUNT TO involved joints UP TO TWICE DAILY [provider] Taking Active   ENTRESTO 97-103 MG [id number] No TAKE 1 TABLET BY MOUTH TWICE  DAILY Bensimhon, Shaune Pascal, MD Taking Active   Fezolinetant [facility]) 45 MG TABS [id number]  Take 1 tablet by mouth daily. [name], FNP  Active   folic acid (FOLVITE) 1 MG tablet [id number] No TAKE 1 TABLET BY MOUTH EVERY MORNING Bensimhon, Shaune Pascal, MD Taking Active Self  furosemide (LASIX) 40 MG tablet [id number] No Take 1 tablet (40 mg total) by mouth daily. Bensimhon, Shaune Pascal, MD Taking Active   Glucagon (GVOKE HYPOPEN 2-PACK) 1 MG/0.2ML SOAJ [id number] No Inject 1 mg into the skin as needed. [name], FNP Taking Active   hydrALAZINE (APRESOLINE) 25 MG tablet [id number] No Take 0.5 tablets (12.5 mg total) by mouth 3 (three) times daily. Norris, Maricela Bo, FNP Taking Active   HYDROcodone bit-homatropine (HYDROMET) 5-1.5 MG/5ML syrup [id number] No Take 5 mLs by mouth every 6 (six) hours as needed for cough.  Patient not taking: Reported on 10/22/2022   [name], FNP Not Taking Active   hydroxychloroquine (PLAQUENIL) 200 MG tablet [id number] No Take 1 tablet (200 mg total) by mouth 2 (two) times daily. Bensimhon, Shaune Pascal, MD Taking  Active   insulin degludec (TRESIBA FLEXTOUCH) 100 UNIT/ML FlexTouch Pen [id number] No Inject 10 Units into the skin daily.  Patient taking differently: Inject 15 Units into the skin at bedtime.   [name], FNP Taking Active   insulin lispro (HUMALOG KWIKPEN) 100 UNIT/ML KwikPen [id number] No Use according to sliding scale [name], FNP Taking Active   Insulin Pen Needle (NOVOFINE PLUS PEN NEEDLE) 32G X 4 MM MISC [id number] No Use with insulin pens dx code e11.65 [name], FNP Taking Active   Ipratropium-Albuterol (COMBIVENT RESPIMAT) 20-100 MCG/ACT AERS respimat [id number] No Inhale 1 puff into the lungs every 6 (six) hours. [name], FNP Taking Active   ipratropium-albuterol (DUONEB) 0.5-2.5 (3) MG/3ML SOLN [id number] No Take 3 mLs by nebulization every 6 (six) hours as needed. [name], FNP Taking Active   Lactobacillus (ACIDOPHILUS) CAPS capsule [id number] No Take 1 capsule by mouth daily. [provider] Taking Active   levofloxacin (LEVAQUIN) 750 MG tablet [id number]  Take 1 tablet (750 mg total) by mouth daily for 10 days. [name], FNP  Active   Magnesium 400 MG TABS [id number] No Take 1 tablet by mouth daily. [name], FNP Taking Active   metFORMIN (GLUCOPHAGE) 500 MG tablet [id number] No Take 1 tablet (500 mg total) by mouth 2 (two) times daily. [name], FNP Taking Active   methotrexate [facility]) 2.5 MG tablet [id number] No Take 20 mg by mouth every Friday.  [provider] Taking Active Self           Med Note Deboraha Sprang Oct 28, 2016 10:48 AM)    metolazone (ZAROXOLYN) 2.5 MG tablet [id number] No TAKE 1 TABLET BY MOUTH AS NEEDED FOR WEIGHT GAIN OF 5 POUNDS WITHIN 3 DAYS AS DIRECTED needs appt for further refills  Bensimhon, Shaune Pascal, MD Taking Active   mometasone (NASONEX) 50 MCG/ACT nasal spray [id number]  Place 2 sprays into the nose daily. [name], FNP  Active   Multiple Vitamin (MULTIVITAMIN WITH MINERALS) TABS  [id number] No Take 1 tablet by mouth every morning.  [provider] Taking Active Self  OLUMIANT tablet [id number] No Take 2 mg by mouth daily. [provider] Taking Active   ondansetron (ZOFRAN-ODT) 8 MG disintegrating tablet [id number] No Take 1 tablet (8 mg total) by mouth every 8 (eight) hours as needed for nausea or vomiting. Lajean Saver, MD Taking Active   PARoxetine (PAXIL) 10 MG tablet [id number] No Take 1 tablet (10 mg total) by mouth every morning.  Patient not taking: Reported on 09/21/2022   [name], FNP Not Taking Active   potassium chloride (KLOR-CON M) 10 MEQ tablet [id number] No Take 3 tablets (30 mEq total) by mouth 2 (two) times daily. [name], FNP Taking Active   potassium chloride SA (KLOR-CON M) 20 MEQ tablet [id number] No Take 1 tablet (20 mEq total) by mouth 2 (two) times daily. Fredia Sorrow, MD Taking Active   predniSONE (DELTASONE) 10 MG tablet [id number] No Take 1 tablet (10 mg total) by mouth daily. Tapering down to 5 mg every 7 days [name], FNP Taking Active   predniSONE (DELTASONE) 10 MG tablet [id number] No Take 4 tablets (40 mg total) by mouth daily.  Patient not taking: Reported on 09/21/2022   Fredia Sorrow, MD Not Taking Active   PROAIR HFA [phone] Base) MCG/ACT inhaler [id number] No INHALE 2 PUFFS BY MOUTH EVERY DAY AS NEEDED FOR SHORTNESS OF Placido Sou, FNP Taking Active   spironolactone (ALDACTONE) 25 MG tablet [id number] No TAKE 1 TABLET BY MOUTH EVERY  MORNING AND 1 TABLET BY MOUTH  EVERY EVENING NEEDS FOLLOW UP  APPT BEFORE ANYMORE REFILLS Bensimhon, Shaune Pascal, MD Taking Active   torsemide (DEMADEX) 20 MG tablet [id number] No TAKE TWO TABLETS BY MOUTH ONCE DAILY Bensimhon, Shaune Pascal, MD Taking Active   traZODone (DESYREL) 50 MG tablet [id number] No Take 50 mg by mouth at bedtime as needed. [provider] Taking Active   Vitamin D, Ergocalciferol, (DRISDOL) 1.25 MG (50000 UNIT) CAPS capsule [id number] No Take 1  capsule (50,000 Units total) by mouth 2 (two) times a week. [name], FNP Taking Active             Patient Active Problem List   Diagnosis Date Noted   Anemia 03/21/2022   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   History of COVID-19 04/12/2020   COVID-19 virus infection 03/07/2020   Lower abdominal pain 61/44/3154   Chronic systolic (congestive) heart failure ([place]) 01/28/2018   Cough 01/19/2018   Pulmonary infiltrates on CXR 01/19/2018   Migraines 10/08/2017   Sleep apnea with use of continuous positive airway pressure (CPAP) 09/24/2017   Rheumatoid arthritis ([place]) 10/09/2016   Asthma 10/09/2016   Chronic pain 10/09/2016   Heart failure ([place]) 10/09/2016   Congestive heart failure ([place])    Lupus (systemic lupus erythematosus) ([place])    Diabetes mellitus with complication ([place])    Anxiety state    GERD (gastroesophageal reflux disease) 12/28/2015   Essential hypertension 12/26/2015   Type 2 diabetes mellitus ([place]) 00/86/7619   Chronic systolic heart failure ([place]) 08/18/2014   Cardiomyopathy, dilated ([place]) 01/27/2014   Shortness of breath 01/26/2014   SLE (systemic lupus erythematosus) ([place]) 01/26/2014    Immunization History  Administered Date(s) Administered   Influenza,  High Dose Seasonal PF 09/02/2018   Influenza, Seasonal, Injecte, Preservative Fre 11/06/2017, 08/24/2019   Influenza,inj,Quad PF,6+ Mos 11/06/2017, 08/24/2019, 08/30/2020, 09/14/2021   Influenza-Unspecified 08/08/2017, 11/06/2017, 08/30/2020, 09/14/2021   PFIZER(Purple Top)SARS-COV-2 Vaccination 06/16/2020, 07/07/2020, 01/01/2021   Pneumococcal Polysaccharide-23 09/14/2021   Pneumococcal-Unspecified 09/14/2021   Tdap 08/24/2019, 08/24/2019   Zoster Recombinat (Shingrix) 02/26/2022, 07/02/2022    Conditions to be addressed/monitored:  {USCCMDZASSESSMENTOPTIONS:23563}  There are no care plans that you recently modified to display for this patient.    Medication  Assistance: {MEDASSISTANCEINFO:25044}  Compliance/Adherence/Medication fill history: Care Gaps: ***  Star-Rating Drugs: ***  Patient's preferred pharmacy is:  [facility] [place], [place] [address] [place] [address] Phone: [phone] Fax: [phone]  [facility] - [place], [place] - [address] [place] [address] Phone: [phone] Fax: [phone]  [place], [address] [place] [address] Phone: [phone] Fax: [phone]  Uses pill box? {Yes or If no, why not?:20788} Pt endorses ***% compliance  We discussed: {Pharmacy options:24294} Patient decided to: {US Pharmacy Plan:23885}  Care Plan and Follow Up Patient Decision:  {FOLLOWUP:24991}  Plan: {CM FOLLOW UP PLAN:25073}  ***

## 2022-12-06 NOTE — Telephone Encounter (Signed)
  Care Management   Follow Up Note   12/06/2022 Name: Christine Mcdowell MRN: [id number] DOB: 03/21/1971   Referred by: [name], FNP Reason for referral : No chief complaint on file.   An unsuccessful telephone outreach was attempted today. The patient was referred to the case management team for assistance with care management and care coordination.   Follow Up Plan: The patient has been provided with contact information for the care management team and has been advised to call with any health related questions or concerns.   Orlando Penner, CPP, PharmD Clinical Pharmacist Practitioner [facility] [phone]

## 2022-12-08 ENCOUNTER — Other Ambulatory Visit (HOSPITAL_COMMUNITY): Payer: Self-pay | Admitting: Internal Medicine

## 2022-12-12 DIAGNOSIS — E1165 Type 2 diabetes mellitus with hyperglycemia: Secondary | ICD-10-CM | POA: Diagnosis not present

## 2022-12-19 ENCOUNTER — Other Ambulatory Visit (HOSPITAL_COMMUNITY): Payer: Self-pay | Admitting: Family Medicine

## 2022-12-20 NOTE — Progress Notes (Signed)
Remote ICD transmission.   

## 2022-12-31 ENCOUNTER — Other Ambulatory Visit (HOSPITAL_COMMUNITY): Payer: Self-pay | Admitting: Internal Medicine

## 2022-12-31 DIAGNOSIS — E119 Type 2 diabetes mellitus without complications: Secondary | ICD-10-CM | POA: Diagnosis not present

## 2022-12-31 DIAGNOSIS — Z794 Long term (current) use of insulin: Secondary | ICD-10-CM | POA: Diagnosis not present

## 2022-12-31 NOTE — Telephone Encounter (Signed)
Chmg-error.  

## 2023-01-06 ENCOUNTER — Other Ambulatory Visit: Payer: Self-pay

## 2023-01-06 ENCOUNTER — Inpatient Hospital Stay: Payer: 59 | Attending: Internal Medicine | Admitting: Internal Medicine

## 2023-01-06 ENCOUNTER — Inpatient Hospital Stay: Payer: 59

## 2023-01-06 VITALS — BP 140/80 | HR 58 | Temp 98.4°F | Resp 18 | Wt 189.1 lb

## 2023-01-06 DIAGNOSIS — D539 Nutritional anemia, unspecified: Secondary | ICD-10-CM | POA: Diagnosis not present

## 2023-01-06 DIAGNOSIS — D573 Sickle-cell trait: Secondary | ICD-10-CM | POA: Diagnosis not present

## 2023-01-06 DIAGNOSIS — Z79899 Other long term (current) drug therapy: Secondary | ICD-10-CM | POA: Insufficient documentation

## 2023-01-06 DIAGNOSIS — D509 Iron deficiency anemia, unspecified: Secondary | ICD-10-CM | POA: Diagnosis not present

## 2023-01-06 LAB — CBC WITH DIFFERENTIAL (CANCER CENTER ONLY)
Abs Immature Granulocytes: 0.02 10*3/uL (ref 0.00–0.07)
Basophils Absolute: 0 10*3/uL (ref 0.0–0.1)
Basophils Relative: 0 %
Eosinophils Absolute: 0 10*3/uL (ref 0.0–0.5)
Eosinophils Relative: 0 %
HCT: 35.1 % — ABNORMAL LOW (ref 36.0–46.0)
Hemoglobin: 11.2 g/dL — ABNORMAL LOW (ref 12.0–15.0)
Immature Granulocytes: 0 %
Lymphocytes Relative: 17 %
Lymphs Abs: 1.1 10*3/uL (ref 0.7–4.0)
MCH: 27.8 pg (ref 26.0–34.0)
MCHC: 31.9 g/dL (ref 30.0–36.0)
MCV: 87.1 fL (ref 80.0–100.0)
Monocytes Absolute: 0.6 10*3/uL (ref 0.1–1.0)
Monocytes Relative: 9 %
Neutro Abs: 4.9 10*3/uL (ref 1.7–7.7)
Neutrophils Relative %: 74 %
Platelet Count: 226 10*3/uL (ref 150–400)
RBC: 4.03 MIL/uL (ref 3.87–5.11)
RDW: 14.4 % (ref 11.5–15.5)
WBC Count: 6.7 10*3/uL (ref 4.0–10.5)
nRBC: 0 % (ref 0.0–0.2)

## 2023-01-06 LAB — IRON AND IRON BINDING CAPACITY (CC-WL,HP ONLY)
Iron: 132 ug/dL (ref 28–170)
Saturation Ratios: 33 % — ABNORMAL HIGH (ref 10.4–31.8)
TIBC: 396 ug/dL (ref 250–450)
UIBC: 264 ug/dL (ref 148–442)

## 2023-01-06 LAB — VITAMIN B12: Vitamin B-12: 322 pg/mL (ref 180–914)

## 2023-01-06 LAB — FOLATE: Folate: 15.7 ng/mL (ref >5.9)

## 2023-01-06 NOTE — Progress Notes (Signed)
[facility] [phone]   [phone]  OFFICE PROGRESS NOTE  [name], FNP [address] [place] [address]  DIAGNOSIS: Iron deficiency anemia  PRIOR THERAPY: None  CURRENT THERAPY: Multivitamins 1 tablet p.o. daily in addition to folic acid 1 mg p.o. daily.  INTERVAL HISTORY: Christine Mcdowell 52 y.o. female returns to the clinic today for follow-up visit.  The patient is feeling fine today with no concerning complaints except for mild fatigue.  She has a history of lupus and she is on folic acid.  She also has some allergy issues recently.  She denied having any current chest pain, shortness of breath except with exertion with no cough or hemoptysis.  She has no nausea, vomiting, diarrhea or constipation.  She has no headache or visual changes.  She has no recent weight loss or night sweats.  She is here today for evaluation and repeat blood work.  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   Discoid lupus    Fibromyalgia    History of blood transfusion "several"   "related to anemia; had some w/hysterectomy also"   Hx of cardiovascular stress test    ETT-Myoview (9/15):  No ischemia, EF 52%; NORMAL   Hx of echocardiogram    Echo (9/15):  EF 50-55%, ant HK, Gr 1 DD, mild MR, mild LAE, no effusion   Hypertension    Iron deficiency anemia    h/o iron transfusions   Lupus (systemic lupus erythematosus) ([place])    Migraine    "a few/year" (07/03/2016)   Pneumonia 12/2015   RA (rheumatoid arthritis) ([place])    "all over" (07/03/2016)   Sickle cell trait ([place])    Stroke ([place]) 2014 X 1; 2015 X 2; 2016 X 1;    "right side of face more relaxed than the other;  rare speech hesitation" (07/03/2016)   Vaginal Pap smear, abnormal    ASCUS; HPV    ALLERGIES:  is allergic to hydrocodone-acetaminophen, hydromorphone, iodinated contrast media, other, erythromycin, latex, rinvoq [upadacitinib], farxiga [dapagliflozin], tape, and mircette [desogestrel-ethinyl estradiol].  MEDICATIONS:  Current Outpatient Medications  Medication Sig Dispense Refill   amoxicillin-clavulanate (AUGMENTIN) 500-125 MG tablet Take 1 tablet (500 mg total) by mouth 3 (three) times daily. 21 tablet 0   aspirin 81 MG chewable tablet Chew 162 mg by mouth daily. Taking 2 tablets     atorvastatin (LIPITOR) 10 MG tablet Take 1 tablet (10 mg total) by mouth daily. 90 tablet 2   budesonide-formoterol (SYMBICORT) 80-4.5 MCG/ACT inhaler Inhale 2 puffs into the lungs in the morning and at bedtime. 1 each 12   carvedilol (COREG) 25 MG tablet TAKE 1 TABLET BY MOUTH IN THE  MORNING AND 1 TABLET BY MOUTH IN THE EVENING 60 tablet 11   cetirizine (ZYRTEC ALLERGY) 10 MG tablet Take 1 tablet (10 mg total) by mouth daily. 30 tablet 0   Continuous Blood Gluc Receiver (DEXCOM G6 RECEIVER) DEVI 1 Device by Does not apply route 3 (three) times daily before meals. (Patient not taking: Reported on 09/21/2022) 1 each 1   Continuous Blood Gluc Sensor (DEXCOM G6 SENSOR) MISC Inject 1 each into the skin  3 (three) times daily. (Patient not taking: Reported on 09/21/2022) 3 each 1   Continuous Blood Gluc Sensor (DEXCOM G7 SENSOR) MISC Use to check blood sugar 3 times day dx code e11.65 9 each 3   Continuous Blood Gluc Transmit (DEXCOM G6 TRANSMITTER) MISC 1 Device by Does not apply route 3 (three) times daily before meals. (Patient not taking: Reported on 09/21/2022) 1 each 1   diclofenac Sodium (VOLTAREN) 1 % GEL apply A SMALL AMOUNT TO involved joints UP TO TWICE DAILY     ENTRESTO 97-103 MG TAKE 1 TABLET BY MOUTH TWICE  DAILY 60 tablet 11   Fezolinetant (VEOZAH) 45 MG TABS Take 1 tablet by mouth daily. 30 tablet 2    folic acid (FOLVITE) 1 MG tablet TAKE 1 TABLET BY MOUTH EVERY MORNING 30 tablet 0   furosemide (LASIX) 40 MG tablet Take 1 tablet (40 mg total) by mouth daily. 90 tablet 0   Glucagon (GVOKE HYPOPEN 2-PACK) 1 MG/0.2ML SOAJ Inject 1 mg into the skin as needed. 0.2 mL 5   hydrALAZINE (APRESOLINE) 25 MG tablet TAKE ONE-HALF TABLET BY MOUTH 3  TIMES DAILY 150 tablet 0   HYDROcodone bit-homatropine (HYDROMET) 5-1.5 MG/5ML syrup Take 5 mLs by mouth every 6 (six) hours as needed for cough. (Patient not taking: Reported on 10/22/2022) 120 mL 0   hydroxychloroquine (PLAQUENIL) 200 MG tablet Take 1 tablet (200 mg total) by mouth 2 (two) times daily. 180 tablet 0   insulin degludec (TRESIBA FLEXTOUCH) 100 UNIT/ML FlexTouch Pen Inject 10 Units into the skin daily. (Patient taking differently: Inject 15 Units into the skin at bedtime.) 9 mL 1   insulin lispro (HUMALOG KWIKPEN) 100 UNIT/ML KwikPen INJECT SUBCUTANEOUSLY USE  ACCORDING TO SLIDING SCALE  MAXIMUM DAILY DOSE: 15 UNITS 15 mL 2   Insulin Pen Needle (NOVOFINE PLUS PEN NEEDLE) 32G X 4 MM MISC Use with insulin pens dx code e11.65 300 each 3   Ipratropium-Albuterol (COMBIVENT RESPIMAT) 20-100 MCG/ACT AERS respimat Inhale 1 puff into the lungs every 6 (six) hours. 4 g 5   ipratropium-albuterol (DUONEB) 0.5-2.5 (3) MG/3ML SOLN Take 3 mLs by nebulization every 6 (six) hours as needed. 360 mL 2   Lactobacillus (ACIDOPHILUS) CAPS capsule Take 1 capsule by mouth daily.     Magnesium 400 MG TABS Take 1 tablet by mouth daily. 90 tablet 1   metFORMIN (GLUCOPHAGE) 500 MG tablet Take 1 tablet (500 mg total) by mouth 2 (two) times daily. 180 tablet 1   methotrexate (RHEUMATREX) 2.5 MG tablet Take 20 mg by mouth every Friday.   3   metolazone (ZAROXOLYN) 2.5 MG tablet TAKE 1 TABLET BY MOUTH AS NEEDED FOR WEIGHT GAIN OF 5 POUNDS WITHIN 3 DAYS AS DIRECTED needs appt for further refills 5 tablet 0   mometasone (NASONEX) 50 MCG/ACT nasal spray Place 2 sprays into the nose  daily. 1 each 2   Multiple Vitamin (MULTIVITAMIN WITH MINERALS) TABS Take 1 tablet by mouth every morning.      OLUMIANT tablet Take 2 mg by mouth daily.     ondansetron (ZOFRAN-ODT) 8 MG disintegrating tablet Take 1 tablet (8 mg total) by mouth every 8 (eight) hours as needed for nausea or vomiting. 10 tablet 0   PARoxetine (PAXIL) 10 MG tablet Take 1 tablet (10 mg total) by mouth every morning. (Patient not taking: Reported on 09/21/2022) 30 tablet 2   potassium chloride (KLOR-CON M) 10 MEQ tablet TAKE 3 TABLETS BY MOUTH TWICE  DAILY 360 tablet  5   potassium chloride SA (KLOR-CON M) 20 MEQ tablet Take 1 tablet (20 mEq total) by mouth 2 (two) times daily. 8 tablet 0   predniSONE (DELTASONE) 10 MG tablet Take 1 tablet (10 mg total) by mouth daily. Tapering down to 5 mg every 7 days 30 tablet 1   predniSONE (DELTASONE) 10 MG tablet Take 4 tablets (40 mg total) by mouth daily. (Patient not taking: Reported on 09/21/2022) 20 tablet 0   PROAIR HFA 108 (90 Base) MCG/ACT inhaler INHALE 2 PUFFS BY MOUTH EVERY DAY AS NEEDED FOR SHORTNESS OF BREATH 18 g 2   spironolactone (ALDACTONE) 25 MG tablet TAKE 1 TABLET BY MOUTH IN THE  MORNING AND 1 TABLET BY MOUTH IN THE EVENING 60 tablet 11   torsemide (DEMADEX) 20 MG tablet TAKE 2 TABLETS BY MOUTH ONCE  DAILY 200 tablet 0   traZODone (DESYREL) 50 MG tablet Take 50 mg by mouth at bedtime as needed.     Vitamin D, Ergocalciferol, (DRISDOL) 1.25 MG (50000 UNIT) CAPS capsule Take 1 capsule (50,000 Units total) by mouth 2 (two) times a week. 12 capsule 3   No current facility-administered medications for this visit.    SURGICAL HISTORY:  Past Surgical History:  Procedure Laterality Date   ABDOMINAL HYSTERECTOMY  2009   ABDOMINAL WOUND DEHISCENCE  2009   BUNIONECTOMY Left 06/15/2020   CARDIAC CATHETERIZATION N/A 10/11/2016   Procedure: Right/Left Heart Cath and Coronary Angiography;  Surgeon: Jolaine Artist, MD;  Location: [facility];  Service:  Cardiovascular;  Laterality: N/A;   DILATION AND CURETTAGE OF UTERUS  1991   HEMATOMA EVACUATION  2009   abdomen   ICD IMPLANT  01/28/2018   ICD IMPLANT N/A 01/28/2018   Procedure: ICD IMPLANT;  Surgeon: Evans Lance, MD;  Location: [facility];  Service: Cardiovascular;  Laterality: N/A;   INCISE AND DRAIN ABCESS  2009 X 2   "abdomen after hysterectomy"   KNEE ARTHROSCOPY Right 1997   KNEE SURGERY Right    TUBAL LIGATION  1996    REVIEW OF SYSTEMS:  A comprehensive review of systems was negative except for: Constitutional: positive for fatigue Respiratory: positive for dyspnea on exertion   PHYSICAL EXAMINATION: General appearance: alert, cooperative, fatigued, and no distress Head: Normocephalic, without obvious abnormality, atraumatic Neck: no adenopathy, no JVD, supple, symmetrical, trachea midline, and thyroid not enlarged, symmetric, no tenderness/mass/nodules Lymph nodes: Cervical, supraclavicular, and axillary nodes normal. Resp: clear to auscultation bilaterally Back: symmetric, no curvature. ROM normal. No CVA tenderness. Cardio: regular rate and rhythm, S1, S2 normal, no murmur, click, rub or gallop GI: soft, non-tender; bowel sounds normal; no masses,  no organomegaly Extremities: extremities normal, atraumatic, no cyanosis or edema  ECOG PERFORMANCE STATUS: 1 - Symptomatic but completely ambulatory  Blood pressure (!) 140/80, pulse (!) 58, temperature 98.4 F (36.9 C), temperature source Oral, resp. rate 18, weight 189 lb 2 oz (85.8 kg), SpO2 95 %.  LABORATORY DATA: Lab Results  Component Value Date   WBC 6.7 01/06/2023   HGB 11.2 (L) 01/06/2023   HCT 35.1 (L) 01/06/2023   MCV 87.1 01/06/2023   PLT 226 01/06/2023      Chemistry      Component Value Date/Time   NA 140 08/25/2022 1200   NA 145 (H) 02/26/2022 1542   K 2.9 (L) 08/25/2022 1200   CL 100 08/25/2022 1200   CO2 29 08/25/2022 1200   BUN 13 08/25/2022 1200   BUN 13  02/26/2022 1542    CREATININE 0.97 08/25/2022 1200   CREATININE 1.01 (H) 03/21/2022 1252   CREATININE 0.89 10/04/2016 0936      Component Value Date/Time   CALCIUM 8.7 (L) 08/25/2022 1200   ALKPHOS 48 08/25/2022 1200   AST 12 (L) 08/25/2022 1200   AST 12 (L) 03/21/2022 1252   ALT 8 08/25/2022 1200   ALT 10 03/21/2022 1252   BILITOT 0.5 08/25/2022 1200   BILITOT 0.3 03/21/2022 1252       RADIOGRAPHIC STUDIES: No results found.  ASSESSMENT AND PLAN: This is a very pleasant 52 years old African-American female with history of iron deficiency anemia as well as sickle cell trait.  The patient is currently on multivitamins in addition to folic acid. The patient is currently on observation and feeling fine except for mild fatigue. Repeat CBC today showed mild anemia with hemoglobin of 11.2 and hematocrit of 35.1%. Iron study, ferritin, serum folate and vitamin B12 level are still pending. I recommended for the patient to continue with her current treatment with the multivitamin and folic acid. If the iron studies showed significant iron deficiency, I will arrange for the patient to receive iron infusion in the interval. She will come back for follow-up visit in 3 months for evaluation and repeat blood work. The patient was advised to call immediately if she has any other concerning symptoms in the interval. The patient voices understanding of current disease status and treatment options and is in agreement with the current care plan.  All questions were answered. The patient knows to call the clinic with any problems, questions or concerns. We can certainly see the patient much sooner if necessary.  The total time spent in the appointment was 20 minutes.  Disclaimer: This note was dictated with voice recognition software. Similar sounding words can inadvertently be transcribed and may not be corrected upon review.

## 2023-01-07 ENCOUNTER — Telehealth (INDEPENDENT_AMBULATORY_CARE_PROVIDER_SITE_OTHER): Payer: 59 | Admitting: Nurse Practitioner

## 2023-01-07 DIAGNOSIS — M069 Rheumatoid arthritis, unspecified: Secondary | ICD-10-CM | POA: Diagnosis not present

## 2023-01-07 DIAGNOSIS — N951 Menopausal and female climacteric states: Secondary | ICD-10-CM | POA: Diagnosis not present

## 2023-01-07 DIAGNOSIS — R0981 Nasal congestion: Secondary | ICD-10-CM | POA: Diagnosis not present

## 2023-01-07 LAB — FERRITIN: Ferritin: 69 ng/mL (ref 11–307)

## 2023-01-07 MED ORDER — NOREL AD 4-10-325 MG PO TABS: 1.0000 | ORAL_TABLET | Freq: Two times a day (BID) | ORAL | 1 refills | Status: DC | PRN

## 2023-01-07 MED ORDER — PREDNISONE 5 MG PO TABS: 5.0000 mg | ORAL_TABLET | Freq: Every day | ORAL | 2 refills | Status: DC

## 2023-01-07 MED ORDER — FEMRING 0.05 MG/24HR VA RING: 1.0000 | VAGINAL_RING | VAGINAL | 4 refills | Status: DC

## 2023-01-07 MED ORDER — AMOXICILLIN-POT CLAVULANATE 875-125 MG PO TABS: 1.0000 | ORAL_TABLET | Freq: Two times a day (BID) | ORAL | 0 refills | Status: DC

## 2023-01-07 NOTE — Patient Instructions (Signed)
Postnasal Drip Postnasal drip is the feeling of mucus going down the back of your throat. Mucus is a slimy substance that moistens and cleans your nose and throat, as well as the air pockets in face bones near your forehead and cheeks (sinuses). Small amounts of mucus pass from your nose and sinuses down the back of your throat all the time. This is normal. When you produce too much mucus or the mucus gets too thick, you can feel it. Some common causes of postnasal drip include: Having more mucus because of: A cold or the flu. Allergies. Cold air. Certain medicines. Gastroesophageal reflux. Having more mucus that is thicker because of: A sinus or nasal infection. Dry air. A food allergy. Follow these instructions at home: Relieving discomfort  Gargle with a mixture of salt and water 3-4 times a day or as needed. To make salt water, completely dissolve -1 tsp (3-6 g) of salt in 1 cup (237 mL) of warm water. If the air in your home is dry, use a humidifier to add moisture to the air. Use a saline spray or a container (neti pot) to flush out the nose (nasal irrigation). These methods can help clear away mucus and keep the nasal passages moist. General instructions Take over-the-counter and prescription medicines only as told by your health care provider. Follow instructions from your health care provider about eating or drinking restrictions. You may need to avoid caffeine. Avoid things that you know you are allergic to (allergens), like dust, mold, pollen, pets, or certain foods. Drink enough fluid to keep your urine pale yellow. Keep all follow-up visits. This is important. Contact a health care provider if: You have a fever. You have a sore throat or difficulty swallowing. You have a headache. You have sinus or ear pain. You have a cough that does not go away. The mucus from your nose becomes thick and is green or yellow in color. You have cold or flu symptoms that last more than 10  days. Summary Postnasal drip is the feeling of mucus going down the back of your throat. Use nasal irrigation or a nasal spray to help clear away mucus and keep the nasal passages moist. Avoid things that you know you are allergic to (allergens), like dust, mold, pollen, pets, or certain foods. This information is not intended to replace advice given to you by your health care provider. Make sure you discuss any questions you have with your health care provider. Document Revised: 11/08/2021 Document Reviewed: 11/08/2021 Elsevier Patient Education  2023 Elsevier Inc.  

## 2023-01-07 NOTE — Progress Notes (Signed)
Virtual Visit via MyChart   This visit type was conducted due to national recommendations for restrictions regarding the COVID-19 Pandemic (e.g. social distancing) in an effort to limit this patient's exposure and mitigate transmission in our community.  Due to her co-morbid illnesses, this patient is at least at moderate risk for complications without adequate follow up.  This format is felt to be most appropriate for this patient at this time.  All issues noted in this document were discussed and addressed.  A limited physical exam was performed with this format.    This visit type was conducted due to national recommendations for restrictions regarding the COVID-19 Pandemic (e.g. social distancing) in an effort to limit this patient's exposure and mitigate transmission in our community.  Patients identity confirmed using two different identifiers.  This format is felt to be most appropriate for this patient at this time.  All issues noted in this document were discussed and addressed.  No physical exam was performed (except for noted visual exam findings with Video Visits).    Date:  01/13/2023   ID:  Christine Mcdowell, DOB 05-Dec-1971, MRN [id number]  Patient Location:  Home - spoke with Christine Mcdowell  Provider location:   Office    Chief Complaint:  nasal congestion  History of Present Illness:    Christine Mcdowell is a 52 y.o. female who presents via video conferencing for a telehealth visit today.    The patient does not have symptoms concerning for COVID-19 infection (fever, chills, cough, or new shortness of breath).   Patient presents today for congestion since Thursday then progressively now having runny nose and congestion. She is coughing if draining at night. At the initial symptoms her throat was sore. She took a Covid test last night. Denies fever. Reports her nose is staying "stopped up". She was using saline then started using generic flonase.   She continues to  have hot flashes at night affecting her sleep. 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   Discoid lupus    Fibromyalgia    History of blood transfusion "several"   "related to anemia; had some w/hysterectomy also"   Hx of cardiovascular stress test    ETT-Myoview (9/15):  No ischemia, EF 52%; NORMAL   Hx of echocardiogram    Echo (9/15):  EF 50-55%, ant HK, Gr 1 DD, mild MR, mild LAE, no effusion   Hypertension    Iron deficiency anemia    h/o iron transfusions   Lupus (systemic lupus erythematosus) ([place])    Migraine    "a few/year" (07/03/2016)   Pneumonia 12/2015   RA (rheumatoid arthritis) ([place])    "all over" (07/03/2016)   Sickle cell trait ([place])    Stroke ([place]) 2014 X 1; 2015 X 2; 2016 X 1;    "right side of face more relaxed than the other; rare speech hesitation" (07/03/2016)   Vaginal Pap smear, abnormal    ASCUS; HPV   Past Surgical History:  Procedure Laterality Date   ABDOMINAL HYSTERECTOMY  2009   ABDOMINAL WOUND DEHISCENCE  2009   BUNIONECTOMY Left 06/15/2020   CARDIAC CATHETERIZATION N/A 10/11/2016  Procedure: Right/Left Heart Cath and Coronary Angiography;  Surgeon: Jolaine Artist, MD;  Location: [facility];  Service: Cardiovascular;  Laterality: N/A;   DILATION AND CURETTAGE OF UTERUS  1991   HEMATOMA EVACUATION  2009   abdomen   ICD IMPLANT  01/28/2018   ICD IMPLANT N/A 01/28/2018   Procedure: ICD IMPLANT;  Surgeon: Evans Lance, MD;  Location: [facility];  Service: Cardiovascular;  Laterality: N/A;   INCISE AND DRAIN ABCESS  2009 X 2   "abdomen after hysterectomy"   KNEE ARTHROSCOPY Right 1997   KNEE SURGERY Right    TUBAL LIGATION  1996      Current Meds  Medication Sig   amoxicillin-clavulanate (AUGMENTIN) 875-125 MG tablet Take 1 tablet by mouth 2 (two) times daily.   Estradiol Acetate (FEMRING) 0.05 MG/24HR RING Place 1 each vaginally every 3 (three) months.   NOREL AD 4-10-325 MG TABS Take 1 tablet by mouth 2 (two) times daily as needed.   predniSONE (DELTASONE) 5 MG tablet Take 1 tablet (5 mg total) by mouth daily with breakfast.     Allergies:   Hydrocodone-acetaminophen, Hydromorphone, Iodinated contrast media, Other, Erythromycin, Latex, Rinvoq [upadacitinib], Farxiga [dapagliflozin], Tape, and Mircette [desogestrel-ethinyl estradiol]   Social History   Tobacco Use   Smoking status: Never   Smokeless tobacco: Never  Vaping Use   Vaping Use: Never used  Substance Use Topics   Alcohol use: No   Drug use: No     Family Hx: The patient's family history includes Allergies in her father and mother; Arthritis in her mother; Asthma in her maternal grandmother; Cancer in her paternal grandfather; Cushing syndrome in her father; Dementia in her paternal grandmother; Depression in her father; Diabetes in her maternal grandmother; Drug abuse in her mother; Heart attack in her father and maternal grandfather; Heart murmur in her mother; Hypertension in her maternal grandmother. There is no history of Breast cancer.  ROS:   Please see the history of present illness.    Review of Systems  Constitutional:  Positive for malaise/fatigue.  HENT:  Positive for congestion.   Respiratory:  Positive for cough. Negative for sputum production, shortness of breath and wheezing.   Cardiovascular: Negative.   Gastrointestinal: Negative.   Musculoskeletal: Negative.   Neurological:  Positive for headaches. Negative for dizziness and weakness.  Psychiatric/Behavioral: Negative.  Negative for depression.     All other systems reviewed and are negative.   Labs/Other Tests and Data Reviewed:    Recent Labs: 03/05/2022: B Natriuretic  Peptide 19.9 03/08/2022: Magnesium 1.7 08/25/2022: ALT 8; BUN 13; Creatinine, Ser 0.97; Potassium 2.9; Sodium 140 01/06/2023: Hemoglobin 11.2; Platelet Count 226   Recent Lipid Panel Lab Results  Component Value Date/Time   CHOL 188 02/26/2022 03:48 PM   TRIG 108 02/26/2022 03:48 PM   HDL 48 02/26/2022 03:48 PM   CHOLHDL 3.9 02/26/2022 03:48 PM   LDLCALC 120 (H) 02/26/2022 03:48 PM    Wt Readings from Last 3 Encounters:  01/06/23 189 lb 2 oz (85.8 kg)  10/22/22 186 lb 6.4 oz (84.6 kg)  10/07/22 187 lb 12.8 oz (85.2 kg)     Exam:    Vital Signs:  There were no vitals taken for this visit.    Physical Exam Vitals reviewed.  Constitutional:      General: She is not in acute distress.    Appearance: Normal appearance.  Pulmonary:     Effort: Pulmonary effort is normal. No respiratory distress.  Neurological:     General: No focal deficit present.     Mental Status: She is alert and oriented to person, place, and time. Mental status is at baseline.     Cranial Nerves: No cranial nerve deficit.  Psychiatric:        Mood and Affect: Mood and affect normal.        Behavior: Behavior normal.        Thought Content: Thought content normal.        Cognition and Memory: Memory normal.        Judgment: Judgment normal.     ASSESSMENT & PLAN:    1. Nasal congestion Will treat with norel ad and she is to take augmentin twice a day with 7 day history of symptoms and history of sinus infections.  - NOREL AD 4-10-325 MG TABS; Take 1 tablet by mouth 2 (two) times daily as needed.  Dispense: 60 tablet; Refill: 1 - amoxicillin-clavulanate (AUGMENTIN) 875-125 MG tablet; Take 1 tablet by mouth 2 (two) times daily.  Dispense: 20 tablet; Refill: 0  2. Hot flashes due to menopause Will treat with Femring as she has to try this before being approved for [facility]. Rx sent to pharmacy.   3. Rheumatoid arthritis, involving unspecified site, unspecified whether rheumatoid factor present  ([place]) Continue f/u with Rheumatology - predniSONE (DELTASONE) 5 MG tablet; Take 1 tablet (5 mg total) by mouth daily with breakfast.  Dispense: 30 tablet; Refill: 2    COVID-19 Education: The signs and symptoms of COVID-19 were discussed with the patient and how to seek care for testing (follow up with PCP or arrange E-visit).  The importance of social distancing was discussed today.  Patient Risk:   After full review of this patients clinical status, I feel that they are at least moderate risk at this time.  Time:   Today, I have spent 16 minutes/ seconds with the patient with telehealth technology discussing above diagnoses.     Medication Adjustments/Labs and Tests Ordered: Current medicines are reviewed at length with the patient today.  Concerns regarding medicines are outlined above.   Tests Ordered: No orders of the defined types were placed in this encounter.   Medication Changes: Meds ordered this encounter  Medications   NOREL AD 4-10-325 MG TABS    Sig: Take 1 tablet by mouth 2 (two) times daily as needed.    Dispense:  60 tablet    Refill:  1   predniSONE (DELTASONE) 5 MG tablet    Sig: Take 1 tablet (5 mg total) by mouth daily with breakfast.    Dispense:  30 tablet    Refill:  2   amoxicillin-clavulanate (AUGMENTIN) 875-125 MG tablet    Sig: Take 1 tablet by mouth 2 (two) times daily.    Dispense:  20 tablet    Refill:  0   Estradiol Acetate (FEMRING) 0.05 MG/24HR RING    Sig: Place 1 each vaginally every 3 (three) months.    Dispense:  1 each    Refill:  4    Disposition:  Follow up 3 months for diabetes  Signed, [name], FNP

## 2023-01-08 ENCOUNTER — Ambulatory Visit: Payer: 59 | Admitting: Nurse Practitioner

## 2023-01-12 DIAGNOSIS — E1165 Type 2 diabetes mellitus with hyperglycemia: Secondary | ICD-10-CM | POA: Diagnosis not present

## 2023-01-13 ENCOUNTER — Encounter: Payer: Self-pay | Admitting: Nurse Practitioner

## 2023-01-13 ENCOUNTER — Other Ambulatory Visit: Payer: Self-pay | Admitting: Nurse Practitioner

## 2023-01-13 DIAGNOSIS — E1165 Type 2 diabetes mellitus with hyperglycemia: Secondary | ICD-10-CM

## 2023-01-16 ENCOUNTER — Telehealth (HOSPITAL_COMMUNITY): Payer: Self-pay

## 2023-01-16 NOTE — Telephone Encounter (Signed)
Christine Mcdowell called with complaints of feeling very fatigued and her BP was 76/51 this morning. She had gone out to run some errands and I asked her to check for me again when she was home and it had come up to 103/64. She stated her BP reading was about 30 minutes after she had taken her morning medications.  Please advise if we need to change any medications or have her be seen.

## 2023-01-20 NOTE — Telephone Encounter (Signed)
I spoke to patient. Her BP this morning was 107/79. She states she feels a lot of fatigue and does not know if her BP is the cause. I set up an appointment for APP clinic 2/13. She will call us in the interim if BP gets too low.

## 2023-01-21 ENCOUNTER — Encounter: Payer: Self-pay | Admitting: Pharmacist

## 2023-01-23 ENCOUNTER — Telehealth: Payer: 59 | Admitting: Physician Assistant

## 2023-01-23 DIAGNOSIS — J310 Chronic rhinitis: Secondary | ICD-10-CM | POA: Diagnosis not present

## 2023-01-23 DIAGNOSIS — J329 Chronic sinusitis, unspecified: Secondary | ICD-10-CM

## 2023-01-23 MED ORDER — AZELASTINE-FLUTICASONE 137-50 MCG/ACT NA SUSP: 1.0000 | Freq: Two times a day (BID) | NASAL | 0 refills | Status: DC

## 2023-01-23 MED ORDER — DOXYCYCLINE HYCLATE 100 MG PO TABS: 100.0000 mg | ORAL_TABLET | Freq: Two times a day (BID) | ORAL | 0 refills | Status: DC

## 2023-01-23 NOTE — Patient Instructions (Signed)
Christine Mcdowell, thank you for joining [name], PA-C for today's virtual visit.  While this provider is not your primary care provider (PCP), if your PCP is located in our provider database this encounter information will be shared with them immediately following your visit.   [facility] account gives you access to today's visit and all your visits, tests, and labs performed at [facility] " click here if you don't have a [facility] account or go to [email or web address]  Consent: (Patient) Christine Mcdowell provided verbal consent for this virtual visit at the beginning of the encounter.  Current Medications:  Current Outpatient Medications:    amoxicillin-clavulanate (AUGMENTIN) 875-125 MG tablet, Take 1 tablet by mouth 2 (two) times daily., Disp: 20 tablet, Rfl: 0   aspirin 81 MG chewable tablet, Chew 162 mg by mouth daily. Taking 2 tablets, Disp: , Rfl:    atorvastatin (LIPITOR) 10 MG tablet, Take 1 tablet (10 mg total) by mouth daily., Disp: 90 tablet, Rfl: 2   budesonide-formoterol (SYMBICORT) 80-4.5 MCG/ACT inhaler, Inhale 2 puffs into the lungs in the morning and at bedtime., Disp: 1 each, Rfl: 12   carvedilol (COREG) 25 MG tablet, TAKE 1 TABLET BY MOUTH IN THE  MORNING AND 1 TABLET BY MOUTH IN THE EVENING, Disp: 60 tablet, Rfl: 11   cetirizine (ZYRTEC ALLERGY) 10 MG tablet, Take 1 tablet (10 mg total) by mouth daily., Disp: 30 tablet, Rfl: 0   Continuous Blood Gluc Sensor (DEXCOM G7 SENSOR) MISC, Use to check blood sugar 3 times day dx code e11.65, Disp: 9 each, Rfl: 3   diclofenac Sodium (VOLTAREN) 1 % GEL, apply A SMALL AMOUNT TO involved joints UP TO TWICE DAILY, Disp: , Rfl:    ENTRESTO 97-103 MG, TAKE 1 TABLET BY MOUTH TWICE  DAILY, Disp: 60 tablet, Rfl: 11   Estradiol Acetate (FEMRING) 0.05 MG/24HR RING, Place 1 each vaginally every 3 (three) months., Disp: 1 each, Rfl: 4   folic acid (FOLVITE) 1 MG tablet, TAKE 1 TABLET BY  MOUTH EVERY MORNING, Disp: 30 tablet, Rfl: 0   furosemide (LASIX) 40 MG tablet, Take 1 tablet (40 mg total) by mouth daily., Disp: 90 tablet, Rfl: 0   Glucagon (GVOKE HYPOPEN 2-PACK) 1 MG/0.2ML SOAJ, Inject 1 mg into the skin as needed., Disp: 0.2 mL, Rfl: 5   hydrALAZINE (APRESOLINE) 25 MG tablet, TAKE ONE-HALF TABLET BY MOUTH 3  TIMES DAILY, Disp: 150 tablet, Rfl: 0   hydroxychloroquine (PLAQUENIL) 200 MG tablet, Take 1 tablet (200 mg total) by mouth 2 (two) times daily., Disp: 180 tablet, Rfl: 0   Insulin Pen Needle (NOVOFINE PLUS PEN NEEDLE) 32G X 4 MM MISC, Use with insulin pens dx code e11.65, Disp: 300 each, Rfl: 3   Ipratropium-Albuterol (COMBIVENT RESPIMAT) 20-100 MCG/ACT AERS respimat, Inhale 1 puff into the lungs every 6 (six) hours., Disp: 4 g, Rfl: 5   ipratropium-albuterol (DUONEB) 0.5-2.5 (3) MG/3ML SOLN, Take 3 mLs by nebulization every 6 (six) hours as needed., Disp: 360 mL, Rfl: 2   Magnesium 400 MG TABS, Take 1 tablet by mouth daily., Disp: 90 tablet, Rfl: 1   metFORMIN (GLUCOPHAGE) 500 MG tablet, Take 1 tablet (500 mg total) by mouth 2 (two) times daily., Disp: 180 tablet, Rfl: 1   methotrexate (RHEUMATREX) 2.5 MG tablet, Take 20 mg by mouth every Friday. , Disp: , Rfl: 3   metolazone (ZAROXOLYN) 2.5 MG tablet, TAKE 1 TABLET BY MOUTH AS NEEDED FOR WEIGHT GAIN OF 5 POUNDS  WITHIN 3 DAYS AS DIRECTED needs appt for further refills, Disp: 5 tablet, Rfl: 0   Multiple Vitamin (MULTIVITAMIN WITH MINERALS) TABS, Take 1 tablet by mouth every morning. , Disp: , Rfl:    NOREL AD 4-10-325 MG TABS, Take 1 tablet by mouth 2 (two) times daily as needed., Disp: 60 tablet, Rfl: 1   OLUMIANT tablet, Take 2 mg by mouth daily., Disp: , Rfl:    ondansetron (ZOFRAN-ODT) 8 MG disintegrating tablet, Take 1 tablet (8 mg total) by mouth every 8 (eight) hours as needed for nausea or vomiting., Disp: 10 tablet, Rfl: 0   potassium chloride (KLOR-CON M) 10 MEQ tablet, TAKE 3 TABLETS BY MOUTH TWICE  DAILY, Disp:  360 tablet, Rfl: 5   predniSONE (DELTASONE) 5 MG tablet, Take 1 tablet (5 mg total) by mouth daily with breakfast., Disp: 30 tablet, Rfl: 2   PROAIR HFA 108 (90 Base) MCG/ACT inhaler, INHALE 2 PUFFS BY MOUTH EVERY DAY AS NEEDED FOR SHORTNESS OF BREATH, Disp: 18 g, Rfl: 2   spironolactone (ALDACTONE) 25 MG tablet, TAKE 1 TABLET BY MOUTH IN THE  MORNING AND 1 TABLET BY MOUTH IN THE EVENING, Disp: 60 tablet, Rfl: 11   traZODone (DESYREL) 50 MG tablet, Take 50 mg by mouth at bedtime as needed., Disp: , Rfl:    TRESIBA FLEXTOUCH 100 UNIT/ML FlexTouch Pen, INJECT SUBCUTANEOUSLY INTO SKIN  10 UNITS DAILY, Disp: 15 mL, Rfl: 2   Vitamin D, Ergocalciferol, (DRISDOL) 1.25 MG (50000 UNIT) CAPS capsule, Take 1 capsule (50,000 Units total) by mouth 2 (two) times a week., Disp: 12 capsule, Rfl: 3   Medications ordered in this encounter:  No orders of the defined types were placed in this encounter.    *If you need refills on other medications prior to your next appointment, please contact your pharmacy*  Follow-Up: Call back or seek an in-person evaluation if the symptoms worsen or if the condition fails to improve as anticipated.  [place] [phone]  Other Instructions Please take antibiotic as directed.  Increase fluid intake.  Use Saline nasal spray.  Take a daily multivitamin. Start the dymista spray in addition to your regular medications.  Place a humidifier in the bedroom.  Please call or return clinic if symptoms are not improving.  Sinusitis Sinusitis is redness, soreness, and swelling (inflammation) of the paranasal sinuses. Paranasal sinuses are air pockets within the bones of your face (beneath the eyes, the middle of the forehead, or above the eyes). In healthy paranasal sinuses, mucus is able to drain out, and air is able to circulate through them by way of your nose. However, when your paranasal sinuses are inflamed, mucus and air can become trapped. This can allow  bacteria and other germs to grow and cause infection. Sinusitis can develop quickly and last only a short time (acute) or continue over a long period (chronic). Sinusitis that lasts for more than 12 weeks is considered chronic.  CAUSES  Causes of sinusitis include: Allergies. Structural abnormalities, such as displacement of the cartilage that separates your nostrils (deviated septum), which can decrease the air flow through your nose and sinuses and affect sinus drainage. Functional abnormalities, such as when the small hairs (cilia) that line your sinuses and help remove mucus do not work properly or are not present. SYMPTOMS  Symptoms of acute and chronic sinusitis are the same. The primary symptoms are pain and pressure around the affected sinuses. Other symptoms include: Upper toothache. Earache. Headache. Bad breath.  Decreased sense of smell and taste. A cough, which worsens when you are lying flat. Fatigue. Fever. Thick drainage from your nose, which often is green and may contain pus (purulent). Swelling and warmth over the affected sinuses. DIAGNOSIS  Your caregiver will perform a physical exam. During the exam, your caregiver may: Look in your nose for signs of abnormal growths in your nostrils (nasal polyps). Tap over the affected sinus to check for signs of infection. View the inside of your sinuses (endoscopy) with a special imaging device with a light attached (endoscope), which is inserted into your sinuses. If your caregiver suspects that you have chronic sinusitis, one or more of the following tests may be recommended: Allergy tests. Nasal culture A sample of mucus is taken from your nose and sent to a lab and screened for bacteria. Nasal cytology A sample of mucus is taken from your nose and examined by your caregiver to determine if your sinusitis is related to an allergy. TREATMENT  Most cases of acute sinusitis are related to a viral infection and will resolve on  their own within 10 days. Sometimes medicines are prescribed to help relieve symptoms (pain medicine, decongestants, nasal steroid sprays, or saline sprays).  However, for sinusitis related to a bacterial infection, your caregiver will prescribe antibiotic medicines. These are medicines that will help kill the bacteria causing the infection.  Rarely, sinusitis is caused by a fungal infection. In theses cases, your caregiver will prescribe antifungal medicine. For some cases of chronic sinusitis, surgery is needed. Generally, these are cases in which sinusitis recurs more than 3 times per year, despite other treatments. HOME CARE INSTRUCTIONS  Drink plenty of water. Water helps thin the mucus so your sinuses can drain more easily. Use a humidifier. Inhale steam 3 to 4 times a day (for example, sit in the bathroom with the shower running). Apply a warm, moist washcloth to your face 3 to 4 times a day, or as directed by your caregiver. Use saline nasal sprays to help moisten and clean your sinuses. Take over-the-counter or prescription medicines for pain, discomfort, or fever only as directed by your caregiver. SEEK IMMEDIATE MEDICAL CARE IF: You have increasing pain or severe headaches. You have nausea, vomiting, or drowsiness. You have swelling around your face. You have vision problems. You have a stiff neck. You have difficulty breathing. MAKE SURE YOU:  Understand these instructions. Will watch your condition. Will get help right away if you are not doing well or get worse. Document Released: 12/09/2005 Document Revised: 03/02/2012 Document Reviewed: 12/24/2011 [facility] Patient Information 2014 [place], [place].    If you have been instructed to have an in-person evaluation today at a local Urgent Care facility, please use the link below. It will take you to a list of all of our available [facility], including address, phone number and hours of operation. Please do not delay  care.  [facility]  If you or a family member do not have a primary care provider, use the link below to schedule a visit and establish care. When you choose a [facility] primary care physician or advanced practice provider, you gain a long-term partner in health. Find a Primary Care Provider  Learn more about [facility]'s in-office and virtual care options: [facility]

## 2023-01-23 NOTE — Progress Notes (Signed)
Virtual Visit Consent   Christine Mcdowell, you are scheduled for a virtual visit with a [facility] provider today. Just as with appointments in the office, your consent must be obtained to participate. Your consent will be active for this visit and any virtual visit you may have with one of our providers in the next 365 days. If you have a MyChart account, a copy of this consent can be sent to you electronically.  As this is a virtual visit, video technology does not allow for your provider to perform a traditional examination. This may limit your provider's ability to fully assess your condition. If your provider identifies any concerns that need to be evaluated in person or the need to arrange testing (such as labs, EKG, etc.), we will make arrangements to do so. Although advances in technology are sophisticated, we cannot ensure that it will always work on either your end or our end. If the connection with a video visit is poor, the visit may have to be switched to a telephone visit. With either a video or telephone visit, we are not always able to ensure that we have a secure connection.  By engaging in this virtual visit, you consent to the provision of healthcare and authorize for your insurance to be billed (if applicable) for the services provided during this visit. Depending on your insurance coverage, you may receive a charge related to this service.  I need to obtain your verbal consent now. Are you willing to proceed with your visit today? Christine Mcdowell has provided verbal consent on 01/23/2023 for a virtual visit (video or telephone). Christine Mcdowell, [place]  Date: 01/23/2023 12:40 PM  Virtual Visit via Video Note   I, Christine Mcdowell, connected with  Christine Mcdowell  ([id number], Oct 30, 1971) on 01/23/23 at 12:30 PM EST by a video-enabled telemedicine application and verified that I am speaking with the correct person using two identifiers.  Location: Patient: Virtual Visit  Location Patient: Home Provider: Virtual Visit Location Provider: Home Office   I discussed the limitations of evaluation and management by telemedicine and the availability of in person appointments. The patient expressed understanding and agreed to proceed.    History of Present Illness: Christine Mcdowell is a 52 y.o. who identifies as a female who was assigned female at birth, and is being seen today for concerns of a recurring sinus infection. Was diagnosed with bacterial sinusitis on 1/16 being placed on course of Augmentin and prednisone which she completed. Notes within 24-48 hours of finishing her antibiotic, symptoms recurred and have continued to progress since onset. Now with maxillary sinus pain. Denies fever, chills. No substantial cough except in morning 2/2 drainage. Is requesting referral to ENT.   OTC -- Zyrtec, Sudafed.   HPI: HPI  Problems:  Patient Active Problem List   Diagnosis Date Noted   Anemia 03/21/2022   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   History of COVID-19 04/12/2020   COVID-19 virus infection 03/07/2020   Lower abdominal pain 99/37/1696   Chronic systolic (congestive) heart failure ([place]) 01/28/2018   Cough 01/19/2018   Pulmonary infiltrates on CXR 01/19/2018   Migraines 10/08/2017   Sleep apnea with use of continuous positive airway pressure (CPAP) 09/24/2017   Rheumatoid arthritis ([place]) 10/09/2016   Asthma 10/09/2016   Chronic pain 10/09/2016   Heart failure ([place]) 10/09/2016   Congestive heart failure (HCC)    Lupus (systemic lupus erythematosus) ([place])    Diabetes mellitus with complication ([place])  Anxiety state    GERD (gastroesophageal reflux disease) 12/28/2015   Essential hypertension 12/26/2015   Type 2 diabetes mellitus ([place]) 03/47/4259   Chronic systolic heart failure ([place]) 08/18/2014   Cardiomyopathy, dilated ([place]) 01/27/2014   Shortness of breath 01/26/2014   SLE (systemic lupus erythematosus) ([place])  01/26/2014    Allergies:  Allergies  Allergen Reactions   Hydrocodone-Acetaminophen Nausea And Vomiting   Hydromorphone Nausea And Vomiting    Other reaction(s): GI Upset (intolerance), Hypertension (intolerance) Raises blood pressure  Other reaction(s): GI Upset (intolerance), Hypertension (intolerance) Raises blood pressure to stroke level   Iodinated Contrast Media Other (See Comments)    Shuts down kidneys Shuts kidney function down   Other Other (See Comments) and Anaphylaxis    Spicy foods and seasonings Skin Prep "makes my skin peel off" Paper tape causes skin burns   Erythromycin Nausea And Vomiting   Latex Hives   Rinvoq [Upadacitinib] Other (See Comments)   Farxiga [Dapagliflozin] Other (See Comments)   Tape Other (See Comments)    "Skin burns"   Mircette [Desogestrel-Ethinyl Estradiol] Nausea And Vomiting and Rash   Medications:  Current Outpatient Medications:    Azelastine-Fluticasone (DYMISTA) 137-50 MCG/ACT SUSP, Place 1 spray into the nose in the morning and at bedtime., Disp: 23 g, Rfl: 0   doxycycline (VIBRA-TABS) 100 MG tablet, Take 1 tablet (100 mg total) by mouth 2 (two) times daily., Disp: 20 tablet, Rfl: 0   aspirin 81 MG chewable tablet, Chew 162 mg by mouth daily. Taking 2 tablets, Disp: , Rfl:    atorvastatin (LIPITOR) 10 MG tablet, Take 1 tablet (10 mg total) by mouth daily., Disp: 90 tablet, Rfl: 2   budesonide-formoterol (SYMBICORT) 80-4.5 MCG/ACT inhaler, Inhale 2 puffs into the lungs in the morning and at bedtime., Disp: 1 each, Rfl: 12   carvedilol (COREG) 25 MG tablet, TAKE 1 TABLET BY MOUTH IN THE  MORNING AND 1 TABLET BY MOUTH IN THE EVENING, Disp: 60 tablet, Rfl: 11   cetirizine (ZYRTEC ALLERGY) 10 MG tablet, Take 1 tablet (10 mg total) by mouth daily., Disp: 30 tablet, Rfl: 0   Continuous Blood Gluc Sensor (DEXCOM G7 SENSOR) MISC, Use to check blood sugar 3 times day dx code e11.65, Disp: 9 each, Rfl: 3   diclofenac Sodium (VOLTAREN) 1 % GEL,  apply A SMALL AMOUNT TO involved joints UP TO TWICE DAILY, Disp: , Rfl:    ENTRESTO 97-103 MG, TAKE 1 TABLET BY MOUTH TWICE  DAILY, Disp: 60 tablet, Rfl: 11   Estradiol Acetate (FEMRING) 0.05 MG/24HR RING, Place 1 each vaginally every 3 (three) months., Disp: 1 each, Rfl: 4   folic acid (FOLVITE) 1 MG tablet, TAKE 1 TABLET BY MOUTH EVERY MORNING, Disp: 30 tablet, Rfl: 0   furosemide (LASIX) 40 MG tablet, Take 1 tablet (40 mg total) by mouth daily., Disp: 90 tablet, Rfl: 0   Glucagon (GVOKE HYPOPEN 2-PACK) 1 MG/0.2ML SOAJ, Inject 1 mg into the skin as needed., Disp: 0.2 mL, Rfl: 5   hydrALAZINE (APRESOLINE) 25 MG tablet, TAKE ONE-HALF TABLET BY MOUTH 3  TIMES DAILY, Disp: 150 tablet, Rfl: 0   hydroxychloroquine (PLAQUENIL) 200 MG tablet, Take 1 tablet (200 mg total) by mouth 2 (two) times daily., Disp: 180 tablet, Rfl: 0   Insulin Pen Needle (NOVOFINE PLUS PEN NEEDLE) 32G X 4 MM MISC, Use with insulin pens dx code e11.65, Disp: 300 each, Rfl: 3   Ipratropium-Albuterol (COMBIVENT RESPIMAT) 20-100 MCG/ACT AERS respimat, Inhale 1 puff into the lungs  every 6 (six) hours., Disp: 4 g, Rfl: 5   ipratropium-albuterol (DUONEB) 0.5-2.5 (3) MG/3ML SOLN, Take 3 mLs by nebulization every 6 (six) hours as needed., Disp: 360 mL, Rfl: 2   Magnesium 400 MG TABS, Take 1 tablet by mouth daily., Disp: 90 tablet, Rfl: 1   metFORMIN (GLUCOPHAGE) 500 MG tablet, Take 1 tablet (500 mg total) by mouth 2 (two) times daily., Disp: 180 tablet, Rfl: 1   methotrexate (RHEUMATREX) 2.5 MG tablet, Take 20 mg by mouth every Friday. , Disp: , Rfl: 3   metolazone (ZAROXOLYN) 2.5 MG tablet, TAKE 1 TABLET BY MOUTH AS NEEDED FOR WEIGHT GAIN OF 5 POUNDS WITHIN 3 DAYS AS DIRECTED needs appt for further refills, Disp: 5 tablet, Rfl: 0   Multiple Vitamin (MULTIVITAMIN WITH MINERALS) TABS, Take 1 tablet by mouth every morning. , Disp: , Rfl:    NOREL AD 4-10-325 MG TABS, Take 1 tablet by mouth 2 (two) times daily as needed., Disp: 60 tablet,  Rfl: 1   OLUMIANT tablet, Take 2 mg by mouth daily., Disp: , Rfl:    ondansetron (ZOFRAN-ODT) 8 MG disintegrating tablet, Take 1 tablet (8 mg total) by mouth every 8 (eight) hours as needed for nausea or vomiting., Disp: 10 tablet, Rfl: 0   potassium chloride (KLOR-CON M) 10 MEQ tablet, TAKE 3 TABLETS BY MOUTH TWICE  DAILY, Disp: 360 tablet, Rfl: 5   PROAIR HFA 108 (90 Base) MCG/ACT inhaler, INHALE 2 PUFFS BY MOUTH EVERY DAY AS NEEDED FOR SHORTNESS OF BREATH, Disp: 18 g, Rfl: 2   spironolactone (ALDACTONE) 25 MG tablet, TAKE 1 TABLET BY MOUTH IN THE  MORNING AND 1 TABLET BY MOUTH IN THE EVENING, Disp: 60 tablet, Rfl: 11   traZODone (DESYREL) 50 MG tablet, Take 50 mg by mouth at bedtime as needed., Disp: , Rfl:    TRESIBA FLEXTOUCH 100 UNIT/ML FlexTouch Pen, INJECT SUBCUTANEOUSLY INTO SKIN  10 UNITS DAILY, Disp: 15 mL, Rfl: 2   Vitamin D, Ergocalciferol, (DRISDOL) 1.25 MG (50000 UNIT) CAPS capsule, Take 1 capsule (50,000 Units total) by mouth 2 (two) times a week., Disp: 12 capsule, Rfl: 3  Observations/Objective: Patient is well-developed, well-nourished in no acute distress.  Resting comfortably at home.  Head is normocephalic, atraumatic.  No labored breathing. Speech is clear and coherent with logical content.  Patient is alert and oriented at baseline.   Assessment and Plan: 1. Chronic rhinitis - Azelastine-Fluticasone (DYMISTA) 137-50 MCG/ACT SUSP; Place 1 spray into the nose in the morning and at bedtime.  Dispense: 23 g; Refill: 0  2. Recurrent sinusitis - doxycycline (VIBRA-TABS) 100 MG tablet; Take 1 tablet (100 mg total) by mouth 2 (two) times daily.  Dispense: 20 tablet; Refill: 0 - Azelastine-Fluticasone (DYMISTA) 137-50 MCG/ACT SUSP; Place 1 spray into the nose in the morning and at bedtime.  Dispense: 23 g; Refill: 0  She is aware that we cannot place referrals and she needs to reach out to PCP. I certainly feel ENT assessment is warranted giving how frequently she is dealing  with this issue. Supportive measures and OTC medications reviewed. Continue antihistamine. Will add on Dymista nasal spray and 10-day course of Doxycycline. In-person follow-up precautions reviewed.   Follow Up Instructions: I discussed the assessment and treatment plan with the patient. The patient was provided an opportunity to ask questions and all were answered. The patient agreed with the plan and demonstrated an understanding of the instructions.  A copy of instructions were sent to the patient via MyChart  unless otherwise noted below.   The patient was advised to call back or seek an in-person evaluation if the symptoms worsen or if the condition fails to improve as anticipated.  Time:  I spent 10 minutes with the patient via telehealth technology discussing the above problems/concerns.    Christine Rio, PA-C

## 2023-01-27 ENCOUNTER — Ambulatory Visit (INDEPENDENT_AMBULATORY_CARE_PROVIDER_SITE_OTHER): Payer: 59 | Admitting: Nurse Practitioner

## 2023-01-27 ENCOUNTER — Encounter: Payer: Self-pay | Admitting: Nurse Practitioner

## 2023-01-27 VITALS — BP 122/68 | HR 84 | Temp 98.7°F | Ht 66.0 in | Wt 189.0 lb

## 2023-01-27 DIAGNOSIS — E7800 Pure hypercholesterolemia, unspecified: Secondary | ICD-10-CM | POA: Diagnosis not present

## 2023-01-27 DIAGNOSIS — N951 Menopausal and female climacteric states: Secondary | ICD-10-CM | POA: Diagnosis not present

## 2023-01-27 DIAGNOSIS — Z1321 Encounter for screening for nutritional disorder: Secondary | ICD-10-CM | POA: Diagnosis not present

## 2023-01-27 DIAGNOSIS — E1165 Type 2 diabetes mellitus with hyperglycemia: Secondary | ICD-10-CM | POA: Diagnosis not present

## 2023-01-27 DIAGNOSIS — M069 Rheumatoid arthritis, unspecified: Secondary | ICD-10-CM

## 2023-01-27 DIAGNOSIS — Z0001 Encounter for general adult medical examination with abnormal findings: Secondary | ICD-10-CM | POA: Diagnosis not present

## 2023-01-27 DIAGNOSIS — I110 Hypertensive heart disease with heart failure: Secondary | ICD-10-CM | POA: Diagnosis not present

## 2023-01-27 DIAGNOSIS — J309 Allergic rhinitis, unspecified: Secondary | ICD-10-CM | POA: Insufficient documentation

## 2023-01-27 DIAGNOSIS — R61 Generalized hyperhidrosis: Secondary | ICD-10-CM | POA: Insufficient documentation

## 2023-01-27 DIAGNOSIS — J329 Chronic sinusitis, unspecified: Secondary | ICD-10-CM | POA: Diagnosis not present

## 2023-01-27 DIAGNOSIS — Z79899 Other long term (current) drug therapy: Secondary | ICD-10-CM | POA: Diagnosis not present

## 2023-01-27 DIAGNOSIS — R239 Unspecified skin changes: Secondary | ICD-10-CM | POA: Insufficient documentation

## 2023-01-27 DIAGNOSIS — Z7409 Other reduced mobility: Secondary | ICD-10-CM | POA: Insufficient documentation

## 2023-01-27 DIAGNOSIS — F32A Depression, unspecified: Secondary | ICD-10-CM | POA: Insufficient documentation

## 2023-01-27 DIAGNOSIS — I5022 Chronic systolic (congestive) heart failure: Secondary | ICD-10-CM

## 2023-01-27 DIAGNOSIS — J40 Bronchitis, not specified as acute or chronic: Secondary | ICD-10-CM | POA: Insufficient documentation

## 2023-01-27 DIAGNOSIS — D573 Sickle-cell trait: Secondary | ICD-10-CM | POA: Diagnosis not present

## 2023-01-27 DIAGNOSIS — Z0000 Encounter for general adult medical examination without abnormal findings: Secondary | ICD-10-CM

## 2023-01-27 LAB — POC URINALSYSI DIPSTICK (AUTOMATED)
Bilirubin, UA: NEGATIVE
Blood, UA: NEGATIVE
Glucose, UA: NEGATIVE
Ketones, UA: NEGATIVE
Leukocytes, UA: NEGATIVE
Nitrite, UA: NEGATIVE
Protein, UA: NEGATIVE
Spec Grav, UA: 1.02 (ref 1.010–1.025)
Urobilinogen, UA: 0.2 E.U./dL
pH, UA: 6 (ref 5.0–8.0)

## 2023-01-27 MED ORDER — VEOZAH 45 MG PO TABS: 1.0000 | ORAL_TABLET | Freq: Every day | ORAL | 3 refills | Status: DC

## 2023-01-27 NOTE — Progress Notes (Addendum)
Subjective:     Patient ID: Christine Mcdowell , female    DOB: December 14, 1971 , 52 y.o.   MRN: [id number]   Chief Complaint  Patient presents with   Annual Exam    HPI  Here for HM. She had been having a low blood pressure and was to continue same medications and has appt with Dr. Tempie Hoist on 02/04/2023. She has been on Femring for 3 weeks and does not feel any relief.   Wt Readings from Last 3 Encounters: 01/27/23 : 189 lb (85.7 kg) 01/06/23 : 189 lb 2 oz (85.8 kg) 10/22/22 : 186 lb 6.4 oz (84.6 kg)    Diabetes She presents for her follow-up diabetic visit. She has type 2 diabetes mellitus. Pertinent negatives for hypoglycemia include no confusion or nervousness/anxiousness. There are no diabetic associated symptoms. Pertinent negatives for diabetes include no polydipsia, no polyphagia and no polyuria. (She has drank juice to help increase her blood sugars. ) There are no diabetic complications. Risk factors for coronary artery disease include sedentary lifestyle and obesity. Current diabetic treatment includes oral agent (dual therapy) (tresiba (10 units nightly) and metformin - not having spikes - she is not needing the humalog at this time). She is compliant with treatment all of the time. Her weight is stable. She is following a generally healthy diet. When asked about meal planning, she reported none. She has not had a previous visit with a dietitian. She rarely participates in exercise. (Blood sugar is ranging 90-103 in am, continues on prednisone 5 mg a day) An ACE inhibitor/angiotensin II receptor blocker is being taken. She does not see a podiatrist.Eye exam is not current.  Hypertension This is a chronic problem. The current episode started more than 1 year ago. The problem is controlled. Pertinent negatives include no anxiety. Risk factors for coronary artery disease include obesity and sedentary lifestyle. Past treatments include diuretics and calcium channel blockers.     Past  Medical History:  Diagnosis Date   AICD (automatic cardioverter/defibrillator) present 01/28/2018   Anemia    Anginal pain (HCC)    Asthma    Cervical cancer ([place])    cervical 1996   CHF (congestive heart failure) ([place])    Diabetes mellitus without complication ([place])    steroid induced   Discoid lupus    Fibromyalgia    History of blood transfusion "several"   "related to anemia; had some w/hysterectomy also"   Hx of cardiovascular stress test    ETT-Myoview (9/15):  No ischemia, EF 52%; NORMAL   Hx of echocardiogram    Echo (9/15):  EF 50-55%, ant HK, Gr 1 DD, mild MR, mild LAE, no effusion   Hypertension    Iron deficiency anemia    h/o iron transfusions   Lupus (systemic lupus erythematosus) ([place])    Migraine    "a few/year" (07/03/2016)   Pneumonia 12/2015   RA (rheumatoid arthritis) ([place])    "all over" (07/03/2016)   Sickle cell trait ([place])    Stroke ([place]) 2014 X 1; 2015 X 2; 2016 X 1;    "right side of face more relaxed than the other; rare speech hesitation" (07/03/2016)   Vaginal Pap smear, abnormal    ASCUS; HPV     Family History  Problem Relation Age of Onset   Arthritis Mother    Heart murmur Mother    Drug abuse Mother    Allergies Mother    Heart attack Father    Cushing syndrome Father  Depression Father    Allergies Father    Dementia Paternal Grandmother    Cancer Paternal Grandfather    Diabetes Maternal Grandmother    Hypertension Maternal Grandmother    Asthma Maternal Grandmother    Heart attack Maternal Grandfather    Breast cancer Neg Hx      Current Outpatient Medications:    aspirin 81 MG chewable tablet, Chew 162 mg by mouth daily. Taking 2 tablets, Disp: , Rfl:    atorvastatin (LIPITOR) 10 MG tablet, Take 1 tablet (10 mg total) by mouth daily., Disp: 90 tablet, Rfl: 2   Azelastine-Fluticasone (DYMISTA) 137-50 MCG/ACT SUSP, Place 1 spray into the nose in the morning and at bedtime., Disp: 23 g, Rfl: 0   carvedilol (COREG) 25 MG  tablet, TAKE 1 TABLET BY MOUTH IN THE  MORNING AND 1 TABLET BY MOUTH IN THE EVENING, Disp: 60 tablet, Rfl: 11   cetirizine (ZYRTEC ALLERGY) 10 MG tablet, Take 1 tablet (10 mg total) by mouth daily., Disp: 30 tablet, Rfl: 0   Continuous Blood Gluc Sensor (DEXCOM G7 SENSOR) MISC, Use to check blood sugar 3 times day dx code e11.65, Disp: 9 each, Rfl: 3   diclofenac Sodium (VOLTAREN) 1 % GEL, apply A SMALL AMOUNT TO involved joints UP TO TWICE DAILY, Disp: , Rfl:    ENTRESTO 97-103 MG, TAKE 1 TABLET BY MOUTH TWICE  DAILY, Disp: 60 tablet, Rfl: 11   Estradiol Acetate (FEMRING) 0.05 MG/24HR RING, Place 1 each vaginally every 3 (three) months., Disp: 1 each, Rfl: 4   folic acid (FOLVITE) 1 MG tablet, TAKE 1 TABLET BY MOUTH EVERY MORNING, Disp: 30 tablet, Rfl: 0   furosemide (LASIX) 40 MG tablet, Take 1 tablet (40 mg total) by mouth daily., Disp: 90 tablet, Rfl: 0   Glucagon (GVOKE HYPOPEN 2-PACK) 1 MG/0.2ML SOAJ, Inject 1 mg into the skin as needed., Disp: 0.2 mL, Rfl: 5   hydroxychloroquine (PLAQUENIL) 200 MG tablet, Take 1 tablet (200 mg total) by mouth 2 (two) times daily., Disp: 180 tablet, Rfl: 0   Insulin Pen Needle (NOVOFINE PLUS PEN NEEDLE) 32G X 4 MM MISC, Use with insulin pens dx code e11.65, Disp: 300 each, Rfl: 3   Ipratropium-Albuterol (COMBIVENT RESPIMAT) 20-100 MCG/ACT AERS respimat, Inhale 1 puff into the lungs every 6 (six) hours., Disp: 4 g, Rfl: 5   ipratropium-albuterol (DUONEB) 0.5-2.5 (3) MG/3ML SOLN, Take 3 mLs by nebulization every 6 (six) hours as needed., Disp: 360 mL, Rfl: 2   Magnesium 400 MG TABS, Take 1 tablet by mouth daily., Disp: 90 tablet, Rfl: 1   methotrexate (RHEUMATREX) 2.5 MG tablet, Take 20 mg by mouth every Friday. , Disp: , Rfl: 3   metolazone (ZAROXOLYN) 2.5 MG tablet, TAKE 1 TABLET BY MOUTH AS NEEDED FOR WEIGHT GAIN OF 5 POUNDS WITHIN 3 DAYS AS DIRECTED needs appt for further refills, Disp: 5 tablet, Rfl: 0   Multiple Vitamin (MULTIVITAMIN WITH MINERALS) TABS,  Take 1 tablet by mouth every morning. , Disp: , Rfl:    OLUMIANT tablet, Take 2 mg by mouth daily., Disp: , Rfl:    ondansetron (ZOFRAN-ODT) 8 MG disintegrating tablet, Take 1 tablet (8 mg total) by mouth every 8 (eight) hours as needed for nausea or vomiting., Disp: 10 tablet, Rfl: 0   potassium chloride (KLOR-CON M) 10 MEQ tablet, TAKE 3 TABLETS BY MOUTH TWICE  DAILY, Disp: 360 tablet, Rfl: 5   PROAIR HFA 108 (90 Base) MCG/ACT inhaler, INHALE 2 PUFFS BY MOUTH EVERY  DAY AS NEEDED FOR SHORTNESS OF BREATH, Disp: 18 g, Rfl: 2   spironolactone (ALDACTONE) 25 MG tablet, TAKE 1 TABLET BY MOUTH IN THE  MORNING AND 1 TABLET BY MOUTH IN THE EVENING, Disp: 60 tablet, Rfl: 11   TRESIBA FLEXTOUCH 100 UNIT/ML FlexTouch Pen, INJECT SUBCUTANEOUSLY INTO SKIN  10 UNITS DAILY, Disp: 15 mL, Rfl: 2   Vitamin D, Ergocalciferol, (DRISDOL) 1.25 MG (50000 UNIT) CAPS capsule, Take 1 capsule (50,000 Units total) by mouth 2 (two) times a week., Disp: 12 capsule, Rfl: 3   Allergies  Allergen Reactions   Hydrocodone-Acetaminophen Nausea And Vomiting   Hydromorphone Nausea And Vomiting    Other reaction(s): GI Upset (intolerance), Hypertension (intolerance) Raises blood pressure  Other reaction(s): GI Upset (intolerance), Hypertension (intolerance) Raises blood pressure to stroke level   Iodinated Contrast Media Other (See Comments)    Shuts down kidneys Shuts kidney function down   Other Other (See Comments) and Anaphylaxis    Spicy foods and seasonings Skin Prep "makes my skin peel off" Paper tape causes skin burns   Erythromycin Nausea And Vomiting   Latex Hives   Rinvoq [Upadacitinib] Other (See Comments)   Farxiga [Dapagliflozin] Other (See Comments)   Tape Other (See Comments)    "Skin burns"   Mircette [Desogestrel-Ethinyl Estradiol] Nausea And Vomiting and Rash      The patient states she is status post hysterectomy.  No LMP recorded. Patient has had a hysterectomy.. Negative for Dysmenorrhea and  Negative for Menorrhagia. Negative for: breast discharge, breast lump(s), breast pain and breast self exam. Associated symptoms include abnormal vaginal bleeding. Pertinent negatives include abnormal bleeding (hematology), anxiety, decreased libido, depression, difficulty falling sleep, dyspareunia, history of infertility, nocturia, sexual dysfunction, sleep disturbances, urinary incontinence, urinary urgency, vaginal discharge and vaginal itching. Diet regular. The patient states her exercise level is minimal - walking the dog two times a day. She will play with her grandkids.   The patient's tobacco use is:  Social History   Tobacco Use  Smoking Status Never  Smokeless Tobacco Never   She has been exposed to passive smoke. The patient's alcohol use is:  Social History   Substance and Sexual Activity  Alcohol Use No    Review of Systems  Constitutional: Negative.   Respiratory: Negative.    Cardiovascular: Negative.   Endocrine: Negative for polydipsia, polyphagia and polyuria.  Neurological: Negative.   Psychiatric/Behavioral: Negative.  Negative for confusion. The patient is not nervous/anxious.      Today's Vitals   01/27/23 1403  BP: 122/68  Pulse: 84  Temp: 98.7 F (37.1 C)  TempSrc: Oral  SpO2: 99%  Weight: 189 lb (85.7 kg)  Height: '5\' 6"'$  (1.676 m)   Body mass index is 30.51 kg/m.   Objective:  Physical Exam Vitals reviewed.  Constitutional:      General: She is not in acute distress.    Appearance: Normal appearance. She is well-developed. She is obese.  HENT:     Head: Normocephalic and atraumatic.     Right Ear: Hearing, tympanic membrane, ear canal and external ear normal. There is no impacted cerumen.     Left Ear: Hearing, tympanic membrane, ear canal and external ear normal. There is no impacted cerumen.     Nose: Congestion present.     Right Sinus: No maxillary sinus tenderness or frontal sinus tenderness.     Left Sinus: No maxillary sinus  tenderness or frontal sinus tenderness.     Mouth/Throat:  Mouth: Mucous membranes are moist.  Eyes:     General: Lids are normal.     Extraocular Movements: Extraocular movements intact.     Conjunctiva/sclera: Conjunctivae normal.     Pupils: Pupils are equal, round, and reactive to light.     Funduscopic exam:    Right eye: No papilledema.        Left eye: No papilledema.  Neck:     Thyroid: No thyroid mass.     Vascular: No carotid bruit.  Cardiovascular:     Rate and Rhythm: Normal rate and regular rhythm.     Pulses: Normal pulses.     Heart sounds: Normal heart sounds. No murmur heard. Pulmonary:     Effort: Pulmonary effort is normal. No respiratory distress.     Breath sounds: Normal breath sounds. No wheezing.  Abdominal:     General: Abdomen is flat. Bowel sounds are normal. There is no distension.     Palpations: Abdomen is soft.     Tenderness: There is no abdominal tenderness.  Musculoskeletal:        General: No swelling or tenderness. Normal range of motion.     Cervical back: Full passive range of motion without pain, normal range of motion and neck supple.     Right lower leg: No edema.     Left lower leg: No edema.  Skin:    General: Skin is warm and dry.     Capillary Refill: Capillary refill takes less than 2 seconds.  Neurological:     General: No focal deficit present.     Mental Status: She is alert and oriented to person, place, and time.     Cranial Nerves: No cranial nerve deficit.     Sensory: No sensory deficit.     Motor: No weakness.  Psychiatric:        Mood and Affect: Mood normal.        Behavior: Behavior normal.        Thought Content: Thought content normal.        Judgment: Judgment normal.         Assessment And Plan:     1. Encounter for health maintenance examination Behavior modifications discussed and diet history reviewed.   Pt will continue to exercise regularly and modify diet with low GI, plant based foods and  decrease intake of processed foods.  Recommend intake of daily multivitamin, Vitamin D, and calcium.  Recommend mammogram and colonoscopy for preventive screenings, as well as recommend immunizations that include influenza, TDAP, and Shingles  2. Benign hypertension with coincident congestive heart failure (HCC) Comments: Blood pressure is well-controlled.  Continue current medications.  Continue follow-up with cardiology. EKG doen with NSR HR 65 - EKG 12-Lead - POCT Urinalysis Dipstick (Automated)  3. Pure hypercholesterolemia Comments: Continue statin, tolerating well.  Encouraged to eat a low-fat diet. - Lipid panel  4. Rheumatoid arthritis, involving unspecified site, unspecified whether rheumatoid factor present [facility]) Comments: Continue follow-up with rheumatology and current medications  5. Type 2 diabetes mellitus with hyperglycemia, without long-term current use of insulin (HCC) Comments: Hemoglobin A1c is stable, continue current medications. - Hemoglobin A1c - CMP14+EGFR - Lipid panel - VITAMIN D 25 Hydroxy (Vit-D Deficiency, Fractures) - Microalbumin / Creatinine Urine Ratio  6. Frequent sinus infections Comments: Will refer to ENT for further evaluation.  Encouraged to can continue with nasal spray and oral antihistamine. - Ambulatory referral to ENT  7. Hot flashes due to menopause Comments: Femring has  been ineffective, will try to get approved for [facility]. - Fezolinetant (VEOZAH) 45 MG TABS; Take 1 tablet (45 mg total) by mouth daily.  Dispense: 30 tablet; Refill: 3  8. Other long term (current) drug therapy  9. Sickle-cell trait ([place])  10. Chronic systolic heart failure ([place]) Continue f/u with Cardiology   Patient was given opportunity to ask questions. Patient verbalized understanding of the plan and was able to repeat key elements of the plan. All questions were answered to their satisfaction.   [name], FNP   I, [name], FNP, have reviewed all  documentation for this visit. The documentation on 02/20/23 for the exam, diagnosis, procedures, and orders are all accurate and complete.   THE PATIENT IS ENCOURAGED TO PRACTICE SOCIAL DISTANCING DUE TO THE COVID-19 PANDEMIC.

## 2023-01-29 ENCOUNTER — Ambulatory Visit: Payer: Self-pay

## 2023-01-29 NOTE — Patient Outreach (Signed)
  Care Coordination   Follow Up Visit Note   01/29/2023 Name: Christine Mcdowell MRN: [id number] DOB: [birth year]  Christine Mcdowell is a 52 y.o. year old female who sees [name], [place] for primary care. I spoke with  [name] by phone today.  What matters to the patients health and wellness today?  Patient will f/u with ENT for evaluation and treatment of chronic sinusitis.     Goals Addressed               This Visit's Progress     Patient Stated     COMPLETED: To recover from [place] 40 (pt-stated)        Care Coordination Interventions: Determined patient is no longer having symptoms related to COVID 19      COMPLETED: to recover from my knee surgery without complications (pt-stated)        Care Coordination Interventions: Evaluation of current treatment plan related to right knee joint revision and patient's adherence to plan as established by provider Discussed with patient she has completed PT/OT, patient voices no concerns today related right knee pain        Other     To better manage chronic sinusitis        Care Coordination Interventions: Evaluation of current treatment plan related to chronic sinusitis  and patient's adherence to plan as established by provider Determined patient continues to have chronic sinusitis  Reviewed and discussed recent PCP referral to ENT  Educated patient regarding referral process and next steps  Scheduled a return call to patient for 2 weeks to f/u on referral             SDOH assessments and interventions completed:  No     Care Coordination Interventions:  Yes, provided   Follow up plan: Follow up call scheduled for 02/12/23 '@10'$ :30 AM    Encounter Outcome:  Pt. Visit Completed

## 2023-01-29 NOTE — Patient Instructions (Signed)
Visit Information  Thank you for taking time to visit with me today. Please don't hesitate to contact me if I can be of assistance to you.   Following are the goals we discussed today:   Goals Addressed               This Visit's Progress     Patient Stated     COMPLETED: To recover from [place] 19 (pt-stated)        Care Coordination Interventions: Determined patient is no longer having symptoms related to COVID 19      COMPLETED: to recover from my knee surgery without complications (pt-stated)        Care Coordination Interventions: Evaluation of current treatment plan related to right knee joint revision and patient's adherence to plan as established by provider Discussed with patient she has completed PT/OT, patient voices no concerns today related right knee pain        Other     To better manage chronic sinusitis        Care Coordination Interventions: Evaluation of current treatment plan related to chronic sinusitis  and patient's adherence to plan as established by provider Determined patient continues to have chronic sinusitis  Reviewed and discussed recent PCP referral to ENT  Educated patient regarding referral process and next steps  Scheduled a return call to patient for 2 weeks to f/u on referral             Our next appointment is by telephone on 02/12/23 at 10:30 AM  Please call the care guide team at [phone] if you need to cancel or reschedule your appointment.   If you are experiencing a Mental Health or Advance or need someone to talk to, please go to [facility] [address] [place] [phone])  Patient verbalizes understanding of instructions and care plan provided today and agrees to view in [place]. Active MyChart status and patient understanding of how to access instructions and care plan via MyChart confirmed with patient.     Barb Merino, RN, BSN, CCM Care Management  Coordinator [facility] Care Management  Direct Phone: [phone]

## 2023-01-30 LAB — CMP14+EGFR
ALT: 11 IU/L (ref 0–32)
AST: 14 IU/L (ref 0–40)
Albumin/Globulin Ratio: 2 (ref 1.2–2.2)
Albumin: 4.5 g/dL (ref 3.8–4.9)
Alkaline Phosphatase: 51 IU/L (ref 44–121)
BUN/Creatinine Ratio: 13 (ref 9–23)
BUN: 15 mg/dL (ref 6–24)
Bilirubin Total: 0.3 mg/dL (ref 0.0–1.2)
CO2: 25 mmol/L (ref 20–29)
Calcium: 9 mg/dL (ref 8.7–10.2)
Chloride: 104 mmol/L (ref 96–106)
Creatinine, Ser: 1.19 mg/dL — ABNORMAL HIGH (ref 0.57–1.00)
Globulin, Total: 2.3 g/dL (ref 1.5–4.5)
Glucose: 74 mg/dL (ref 70–99)
Potassium: 4 mmol/L (ref 3.5–5.2)
Sodium: 142 mmol/L (ref 134–144)
Total Protein: 6.8 g/dL (ref 6.0–8.5)
eGFR: 54 mL/min/{1.73_m2} — ABNORMAL LOW (ref >59)

## 2023-01-30 LAB — LIPID PANEL
Chol/HDL Ratio: 4.6 ratio — ABNORMAL HIGH (ref 0.0–4.4)
Cholesterol, Total: 217 mg/dL — ABNORMAL HIGH (ref 100–199)
HDL: 47 mg/dL (ref >39)
LDL Chol Calc (NIH): 146 mg/dL — ABNORMAL HIGH (ref 0–99)
Triglycerides: 136 mg/dL (ref 0–149)
VLDL Cholesterol Cal: 24 mg/dL (ref 5–40)

## 2023-01-30 LAB — MICROALBUMIN / CREATININE URINE RATIO
Creatinine, Urine: 168.9 mg/dL
Microalb/Creat Ratio: 13 mg/g creat (ref 0–29)
Microalbumin, Urine: 22.4 ug/mL

## 2023-01-30 LAB — HEMOGLOBIN A1C
Est. average glucose Bld gHb Est-mCnc: 154 mg/dL
Hgb A1c MFr Bld: 7 % — ABNORMAL HIGH (ref 4.8–5.6)

## 2023-01-30 LAB — VITAMIN D 25 HYDROXY (VIT D DEFICIENCY, FRACTURES): Vit D, 25-Hydroxy: 49.2 ng/mL (ref 30.0–100.0)

## 2023-02-03 NOTE — Progress Notes (Signed)
Advanced Heart Failure Clinic Note   Date:  02/04/2023   ID:  Christine Mcdowell, DOB [birth year], MRN [id number]  Location: Home  Provider location: [facility] Type of Visit: Established patient  PCP:  [name], FNP  EP: Dr. Lovena Le Rheum: Dr. 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    Admitted in October 2017 with increased dyspnea and volume overload. Diuresed with IV lasix and transitioned to lasix 40 mg bid. L/RHC normal filling pressures, EF ~20%, and normal coronaries. Discharge weight 184 pounds.   Cardiac MRI in 02/2015 showed EF 46%. No LGE.   Echo 8/21: EF ~ 40%-45%, Grade II DD, moderate MR.  Echo 10/09/21 EF 45-50% mild MR   CPX 9/19 with relaltively normal spirometry. Mild HF limitation.   She was seen in ED for cough on 01/24/22. Had low grade temp. Tested positive for influenza A and treated with Tamiflu.   Follow up 2/23 ongoing cough, fatigue and dyspnea since the flu. Seen in ED 02/25/22 and 03/05/22 with SOB & CP. D-dimer elevated, CTA negative for PE.   Last seen 3/23, continued with NYHA III symptoms, volume stable. Repeat echo arranged to ensure EF stable, also arranged for hold sleep study for fatigue work up. Referred to Hem/Onc at patient's request. Lost to follow up.  Echo 4/23 showed EF 45%  Seen in ED 08/25/22 with SOB felt to be related to post COVID cough. Started on 5 days of prednisone and K repleted.  Today she returns for an acute visit with complaints of low BP and fatigue. Overall feeling exhausted. Has not completed sleep study, has old CPAP but unable to use it. Morning BP has been in [id number] systolically for  past couple weeks. No recent sick contacts. Continues with allergy issues and has been referred to ENT. She is not SOB walking on flat ground, mild dyspnea with stairs. She keeps her two young grand children. She has some swelling in ankles. Feels palpitations at night, no dizziness. Denies abnormal bleeding, CP, dizziness, or PND/Orthopnea. Appetite ok. No fever or chills. Weight at home 189 pounds. Taking all medications. Has not needed metolazone recently.   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   Anemia    Anginal pain (HCC)    Asthma    Cervical cancer ([place])    cervical 1996   CHF (congestive heart failure) ([place])    Diabetes mellitus without complication ([place])    steroid induced   Discoid lupus    Fibromyalgia    History of blood transfusion "several"   "related to anemia; had some w/hysterectomy also"   Hx of cardiovascular stress test    ETT-Myoview (9/15):  No ischemia, EF 52%; NORMAL   Hx of echocardiogram    Echo (9/15):  EF 50-55%, ant HK, Gr 1 DD, mild MR, mild LAE, no effusion   Hypertension    Iron deficiency anemia    h/o iron transfusions   Lupus (systemic lupus erythematosus) ([place])    Migraine    "a few/year" (07/03/2016)   Pneumonia 12/2015   RA (rheumatoid arthritis) ([place])    "all over" (07/03/2016)   Sickle cell trait ([place])  Stroke ([place]) 2014 X 1; 2015 X 2; 2016 X 1;    "right side of face more relaxed than the other; rare speech hesitation" (07/03/2016)   Vaginal Pap smear, abnormal    ASCUS; HPV   Past Surgical History:  Procedure Laterality Date   ABDOMINAL HYSTERECTOMY  2009   ABDOMINAL WOUND DEHISCENCE  2009   BUNIONECTOMY Left 06/15/2020   CARDIAC CATHETERIZATION N/A 10/11/2016   Procedure: Right/Left Heart Cath and Coronary Angiography;  Surgeon: Jolaine Artist, MD;  Location: [facility];  Service: Cardiovascular;  Laterality: N/A;   DILATION AND CURETTAGE OF UTERUS  1991    HEMATOMA EVACUATION  2009   abdomen   ICD IMPLANT  01/28/2018   ICD IMPLANT N/A 01/28/2018   Procedure: ICD IMPLANT;  Surgeon: Evans Lance, MD;  Location: [facility];  Service: Cardiovascular;  Laterality: N/A;   INCISE AND DRAIN ABCESS  2009 X 2   "abdomen after hysterectomy"   KNEE ARTHROSCOPY Right 1997   KNEE SURGERY Right    TUBAL LIGATION  1996   Current Outpatient Medications  Medication Sig Dispense Refill   aspirin 81 MG chewable tablet Chew 162 mg by mouth daily. Taking 2 tablets     atorvastatin (LIPITOR) 10 MG tablet Take 1 tablet (10 mg total) by mouth daily. 90 tablet 2   Azelastine-Fluticasone (DYMISTA) 137-50 MCG/ACT SUSP Place 1 spray into the nose in the morning and at bedtime. 23 g 0   carvedilol (COREG) 25 MG tablet TAKE 1 TABLET BY MOUTH IN THE  MORNING AND 1 TABLET BY MOUTH IN THE EVENING 60 tablet 11   cetirizine (ZYRTEC ALLERGY) 10 MG tablet Take 1 tablet (10 mg total) by mouth daily. 30 tablet 0   Continuous Blood Gluc Sensor (DEXCOM G7 SENSOR) MISC Use to check blood sugar 3 times day dx code e11.65 9 each 3   diclofenac Sodium (VOLTAREN) 1 % GEL apply A SMALL AMOUNT TO involved joints UP TO TWICE DAILY     ENTRESTO 97-103 MG TAKE 1 TABLET BY MOUTH TWICE  DAILY 60 tablet 11   Estradiol Acetate (FEMRING) 0.05 MG/24HR RING Place 1 each vaginally every 3 (three) months. 1 each 4   folic acid (FOLVITE) 1 MG tablet TAKE 1 TABLET BY MOUTH EVERY MORNING 30 tablet 0   furosemide (LASIX) 40 MG tablet Take 1 tablet (40 mg total) by mouth daily. 90 tablet 0   Glucagon (GVOKE HYPOPEN 2-PACK) 1 MG/0.2ML SOAJ Inject 1 mg into the skin as needed. 0.2 mL 5   hydrALAZINE (APRESOLINE) 25 MG tablet TAKE ONE-HALF TABLET BY MOUTH 3  TIMES DAILY 150 tablet 0   hydroxychloroquine (PLAQUENIL) 200 MG tablet Take 1 tablet (200 mg total) by mouth 2 (two) times daily. 180 tablet 0   Insulin Pen Needle (NOVOFINE PLUS PEN NEEDLE) 32G X 4 MM MISC Use with insulin pens dx code  e11.65 300 each 3   Ipratropium-Albuterol (COMBIVENT RESPIMAT) 20-100 MCG/ACT AERS respimat Inhale 1 puff into the lungs every 6 (six) hours. 4 g 5   ipratropium-albuterol (DUONEB) 0.5-2.5 (3) MG/3ML SOLN Take 3 mLs by nebulization every 6 (six) hours as needed. 360 mL 2   Magnesium 400 MG TABS Take 1 tablet by mouth daily. 90 tablet 1   methotrexate (RHEUMATREX) 2.5 MG tablet Take 20 mg by mouth every Friday.   3   metolazone (ZAROXOLYN) 2.5 MG tablet TAKE 1 TABLET BY MOUTH AS NEEDED FOR WEIGHT GAIN OF 5 POUNDS  WITHIN 3 DAYS AS DIRECTED needs appt for further refills 5 tablet 0   Multiple Vitamin (MULTIVITAMIN WITH MINERALS) TABS Take 1 tablet by mouth every morning.      OLUMIANT tablet Take 2 mg by mouth daily.     ondansetron (ZOFRAN-ODT) 8 MG disintegrating tablet Take 1 tablet (8 mg total) by mouth every 8 (eight) hours as needed for nausea or vomiting. 10 tablet 0   potassium chloride (KLOR-CON M) 10 MEQ tablet TAKE 3 TABLETS BY MOUTH TWICE  DAILY 360 tablet 5   PROAIR HFA 108 (90 Base) MCG/ACT inhaler INHALE 2 PUFFS BY MOUTH EVERY DAY AS NEEDED FOR SHORTNESS OF BREATH 18 g 2   spironolactone (ALDACTONE) 25 MG tablet TAKE 1 TABLET BY MOUTH IN THE  MORNING AND 1 TABLET BY MOUTH IN THE EVENING 60 tablet 11   TRESIBA FLEXTOUCH 100 UNIT/ML FlexTouch Pen INJECT SUBCUTANEOUSLY INTO SKIN  10 UNITS DAILY 15 mL 2   Vitamin D, Ergocalciferol, (DRISDOL) 1.25 MG (50000 UNIT) CAPS capsule Take 1 capsule (50,000 Units total) by mouth 2 (two) times a week. 12 capsule 3   No current facility-administered medications for this encounter.   Allergies:   Hydrocodone-acetaminophen, Hydromorphone, Iodinated contrast media, Other, Erythromycin, Latex, Rinvoq [upadacitinib], Farxiga [dapagliflozin], Tape, and Mircette [desogestrel-ethinyl estradiol]   Social History:  The patient  reports that she has never smoked. She has never used smokeless tobacco. She reports that she does not drink alcohol and does not use  drugs.   Family History:  The patient's family history includes Allergies in her father and mother; Arthritis in her mother; Asthma in her maternal grandmother; Cancer in her paternal grandfather; Cushing syndrome in her father; Dementia in her paternal grandmother; Depression in her father; Diabetes in her maternal grandmother; Drug abuse in her mother; Heart attack in her father and maternal grandfather; Heart murmur in her mother; Hypertension in her maternal grandmother.   ROS:  Please see the history of present illness.   All other systems are personally reviewed and negative.   Recent Labs: 03/05/2022: B Natriuretic Peptide 19.9 03/08/2022: Magnesium 1.7 01/06/2023: Hemoglobin 11.2; Platelet Count 226 01/27/2023: ALT 11; BUN 15; Creatinine, Ser 1.19; Potassium 4.0; Sodium 142  Personally reviewed   Wt Readings from Last 3 Encounters:  02/04/23 87.4 kg (192 lb 9.6 oz)  01/27/23 85.7 kg (189 lb)  01/06/23 85.8 kg (189 lb 2 oz)    BP 120/84   Pulse (!) 58   Wt 87.4 kg (192 lb 9.6 oz)   SpO2 100%   BMI 31.09 kg/m   PHYSICAL EXAM: General:  NAD. No resp difficulty, walked into clinic HEENT: Normal Neck: Supple. No JVD. Carotids 2+ bilat; no bruits. No lymphadenopathy or thryomegaly appreciated. Cor: PMI nondisplaced. Regular rate & rhythm. No rubs, gallops or murmurs. Lungs: Clear Abdomen: Soft, nontender, nondistended. No hepatosplenomegaly. No bruits or masses. Good bowel sounds. Extremities: No cyanosis, clubbing, rash, edema Neuro: Alert & oriented x 3, cranial nerves grossly intact. Moves all 4 extremities w/o difficulty. Affect pleasant.  Device Interrogation: HL Score 8. Average HR 82 bpm. Activity ~ 1.2 hrs/day. No shocks Personally reviewed; several short episodes of NSVT in Jan and Feb 2024.  ASSESSMENT AND PLAN: 1. Chronic Systolic Heart Failure - NICM. Cath 10/11/16 with normal cors.  - cMRI in 02/2015 showed EF 46%. No LGE. - CPX 10/2016: Peak VO2: 18.5 (82% predicted  peak VO2), VE/VCO2 slope: 30.  - Echo 12/18 EF 25-30%  - S/P [employer]  ICD 01/2018.  - Repeat CPX 9/19 with mild HF limitation. - Echo 8/21: EF ~ 40%-45%, Grade II DD, moderate MR. - Echo 10/09/21 EF 45-50% mild MR  - Echo 4/23 EF 45%, grade II DD, RV normal, mild to mod TR - NYHA II, limited more by fatigue. Volume good on exam, HL score 8 - Stop hydralazine with low home BP.  - Continue Lasix 40 mg daily and prn metolazone, needs to take extra 40 KCL PRN. - Continue spiro 25 mg bid. - Continue Coreg 25 mg bid. - Continue Entresto 97/103 mg bid.  - Off Farxiga due to recurrent yeast infections. - No Imdur due to headaches.  - EF out of range for barostim device.  - Labs today.  2. HTN  - Stop hydralazine as above. - Check BP at home and log. - Check labs today.  3. Sinus tach vs atrial flutter on ICD - Zio patch 9/20, Rare PVCs and PACs. - Feels palpitations. - Device interrogation showed NSVT last couple of months. - Place 2 week Zio to quantify. - Needs CPAP - She is due for EP follow up  4. OSA - We have arranged home sleep study in the past, she has been unable to complete - She has old CPAP but not working well - Refer to sleep medicine, suspect contributing to her fatigue.  5. DM2  - Per PCP. She is using a Dexcom. - Off Farxiga due to recurrent yeast infections.  6. Hypokalemia - Needs to take extra 40 KCL w/ metolazone dose. - Check labs today.  7. Lupus - On prednisone and Plaquenil.  - Sees Rheum. Had a flare 12/23.  8. Anemia - Has h/o IDA and required iron infusions in past. - Now followed by heme/onc.  Follow up in 4 months with Dr. Haroldine Laws.  Rafael Bihari, FNP  9:19 AM  Advanced [facility] [address] Heart and [place] [address] [phone] (office) [phone] (fax)

## 2023-02-04 ENCOUNTER — Ambulatory Visit (HOSPITAL_COMMUNITY)
Admission: RE | Admit: 2023-02-04 | Discharge: 2023-02-04 | Disposition: A | Discharge status: Home / Self Care | Payer: 59 | Source: Ambulatory Visit | Attending: Family Medicine | Admitting: Family Medicine

## 2023-02-04 ENCOUNTER — Inpatient Hospital Stay (HOSPITAL_COMMUNITY)
Admission: RE | Admit: 2023-02-04 | Discharge: 2023-02-04 | Disposition: A | Discharge status: Home / Self Care | Payer: Medicaid Other | Source: Ambulatory Visit | Attending: Internal Medicine | Admitting: Internal Medicine

## 2023-02-04 ENCOUNTER — Encounter (HOSPITAL_COMMUNITY): Payer: Self-pay

## 2023-02-04 ENCOUNTER — Other Ambulatory Visit (HOSPITAL_COMMUNITY): Payer: Self-pay | Admitting: Internal Medicine

## 2023-02-04 VITALS — BP 120/84 | HR 58 | Wt 192.6 lb

## 2023-02-04 DIAGNOSIS — I10 Essential (primary) hypertension: Secondary | ICD-10-CM

## 2023-02-04 DIAGNOSIS — Z79899 Other long term (current) drug therapy: Secondary | ICD-10-CM | POA: Insufficient documentation

## 2023-02-04 DIAGNOSIS — I110 Hypertensive heart disease with heart failure: Secondary | ICD-10-CM | POA: Diagnosis not present

## 2023-02-04 DIAGNOSIS — E119 Type 2 diabetes mellitus without complications: Secondary | ICD-10-CM | POA: Insufficient documentation

## 2023-02-04 DIAGNOSIS — M797 Fibromyalgia: Secondary | ICD-10-CM | POA: Diagnosis not present

## 2023-02-04 DIAGNOSIS — G4733 Obstructive sleep apnea (adult) (pediatric): Secondary | ICD-10-CM | POA: Diagnosis not present

## 2023-02-04 DIAGNOSIS — I5022 Chronic systolic (congestive) heart failure: Secondary | ICD-10-CM | POA: Diagnosis not present

## 2023-02-04 DIAGNOSIS — Z794 Long term (current) use of insulin: Secondary | ICD-10-CM | POA: Insufficient documentation

## 2023-02-04 DIAGNOSIS — E876 Hypokalemia: Secondary | ICD-10-CM | POA: Diagnosis not present

## 2023-02-04 DIAGNOSIS — M329 Systemic lupus erythematosus, unspecified: Secondary | ICD-10-CM | POA: Diagnosis not present

## 2023-02-04 DIAGNOSIS — R002 Palpitations: Secondary | ICD-10-CM | POA: Diagnosis not present

## 2023-02-04 DIAGNOSIS — I493 Ventricular premature depolarization: Secondary | ICD-10-CM | POA: Diagnosis not present

## 2023-02-04 DIAGNOSIS — I428 Other cardiomyopathies: Secondary | ICD-10-CM | POA: Diagnosis not present

## 2023-02-04 DIAGNOSIS — Z8639 Personal history of other endocrine, nutritional and metabolic disease: Secondary | ICD-10-CM

## 2023-02-04 DIAGNOSIS — Z8673 Personal history of transient ischemic attack (TIA), and cerebral infarction without residual deficits: Secondary | ICD-10-CM | POA: Insufficient documentation

## 2023-02-04 DIAGNOSIS — Z8616 Personal history of COVID-19: Secondary | ICD-10-CM | POA: Insufficient documentation

## 2023-02-04 DIAGNOSIS — D649 Anemia, unspecified: Secondary | ICD-10-CM | POA: Diagnosis not present

## 2023-02-04 DIAGNOSIS — I4729 Other ventricular tachycardia: Secondary | ICD-10-CM

## 2023-02-04 DIAGNOSIS — E785 Hyperlipidemia, unspecified: Secondary | ICD-10-CM | POA: Diagnosis not present

## 2023-02-04 DIAGNOSIS — E118 Type 2 diabetes mellitus with unspecified complications: Secondary | ICD-10-CM

## 2023-02-04 DIAGNOSIS — I472 Ventricular tachycardia, unspecified: Secondary | ICD-10-CM | POA: Insufficient documentation

## 2023-02-04 DIAGNOSIS — R000 Tachycardia, unspecified: Secondary | ICD-10-CM

## 2023-02-04 NOTE — Patient Instructions (Addendum)
Thank you for coming in today  STOP Hydralazine  You have been referred to Sleep Medicine   Please call Dr. Forde Dandy office to make a appointment [phone]  Your physician recommends that you schedule a follow-up appointment in: 4 months with Dr. Volney American will receive a reminder letter in the mail a few months in advance. If you don't receive a letter, please call our office to schedule the follow-up appointment.   Your provider has recommended that  you wear a Zio Patch for 14 days.  This monitor will record your heart rhythm for our review.  IF you have any symptoms while wearing the monitor please press the button.  If you have any issues with the patch or you notice a red or orange light on it please call the company at [phone].  Once you remove the patch please mail it back to the company as soon as possible so we can get the results.    Do the following things EVERYDAY: Weigh yourself in the morning before breakfast. Write it down and keep it in a log. Take your medicines as prescribed Eat low salt foods--Limit salt (sodium) to 2000 mg per day.  Stay as active as you can everyday Limit all fluids for the day to less than 2 liters  At the [facility], you and your health needs are our priority. As part of our continuing mission to provide you with exceptional heart care, we have created designated Provider Care Teams. These Care Teams include your primary Cardiologist (physician) and Advanced Practice Providers (APPs- Physician Assistants and Nurse Practitioners) who all work together to provide you with the care you need, when you need it.   You may see any of the following providers on your designated Care Team at your next follow up: Dr Glori Bickers Dr Loralie Champagne Dr. Roxana Hires, NP Lyda Jester, [facility], [place] Forestine Na, NP Audry Riles, PharmD   Please be sure to bring in all your  medications bottles to every appointment.    Thank you for choosing [facility]   If you have any questions or concerns before your next appointment please send [place] a message through [place] or call our office at [phone].    TO LEAVE A MESSAGE FOR THE NURSE SELECT OPTION 2, PLEASE LEAVE A MESSAGE INCLUDING: YOUR NAME DATE OF BIRTH CALL BACK NUMBER REASON FOR CALL**this is important as we prioritize the call backs  YOU WILL RECEIVE A CALL BACK THE SAME DAY AS LONG AS YOU CALL BEFORE 4:00 PM

## 2023-02-06 DIAGNOSIS — Z886 Allergy status to analgesic agent status: Secondary | ICD-10-CM | POA: Diagnosis not present

## 2023-02-06 DIAGNOSIS — N6489 Other specified disorders of breast: Secondary | ICD-10-CM | POA: Diagnosis not present

## 2023-02-06 DIAGNOSIS — R928 Other abnormal and inconclusive findings on diagnostic imaging of breast: Secondary | ICD-10-CM | POA: Diagnosis not present

## 2023-02-06 DIAGNOSIS — Z91040 Latex allergy status: Secondary | ICD-10-CM | POA: Diagnosis not present

## 2023-02-06 DIAGNOSIS — Z91041 Radiographic dye allergy status: Secondary | ICD-10-CM | POA: Diagnosis not present

## 2023-02-06 DIAGNOSIS — Z888 Allergy status to other drugs, medicaments and biological substances status: Secondary | ICD-10-CM | POA: Diagnosis not present

## 2023-02-06 DIAGNOSIS — Z1239 Encounter for other screening for malignant neoplasm of breast: Secondary | ICD-10-CM | POA: Diagnosis not present

## 2023-02-06 DIAGNOSIS — Z881 Allergy status to other antibiotic agents status: Secondary | ICD-10-CM | POA: Diagnosis not present

## 2023-02-06 DIAGNOSIS — Z885 Allergy status to narcotic agent status: Secondary | ICD-10-CM | POA: Diagnosis not present

## 2023-02-07 ENCOUNTER — Telehealth (HOSPITAL_COMMUNITY): Payer: Self-pay | Admitting: *Deleted

## 2023-02-07 NOTE — Telephone Encounter (Signed)
Pt called stating her zio monitor is not sticking. I called pt back no answer.

## 2023-02-12 ENCOUNTER — Ambulatory Visit: Payer: Self-pay

## 2023-02-12 DIAGNOSIS — E1165 Type 2 diabetes mellitus with hyperglycemia: Secondary | ICD-10-CM | POA: Diagnosis not present

## 2023-02-12 NOTE — Patient Instructions (Signed)
Visit Information  Thank you for taking time to visit with me today. Please don't hesitate to contact me if I can be of assistance to you.   Following are the goals we discussed today:   Goals Addressed             This Visit's Progress    To better manage chronic sinusitis       Care Coordination Interventions: Evaluation of current treatment plan related to chronic sinusitis  and patient's adherence to plan as established by provider Determined patient received a call from an ENT provider, however she missed the call Determined patient has the contact information and plans to contact this provider in the near future to schedule her new patient appointment         To complete a sleep study for evaluation of OSA       Care Coordination Interventions: Evaluation of current treatment plan related to OSA  and patient's adherence to plan as established by provider Determined patient will undergo a repeat sleep study, this is pending insurance authorization Educated patient about the [place] home sleep study, provided patient with the website to review Instructed patient to contact her Cardiologist provider to request this sleep study if she prefers            Our next appointment is by telephone on 03/12/23 at 10:30 AM  Please call the care guide team at [phone] if you need to cancel or reschedule your appointment.   If you are experiencing a Mental Health or [facility] or need someone to talk to, please call [phone] (toll free, 24 hour hotline) go to [facility] [address] [place] [phone])  Patient verbalizes understanding of instructions and care plan provided today and agrees to view in [place]. Active MyChart status and patient understanding of how to access instructions and care plan via MyChart confirmed with patient.     Barb Merino, RN, BSN, CCM Care Management Coordinator [facility] Care  Management  Direct Phone: [phone]

## 2023-02-12 NOTE — Patient Outreach (Signed)
  Care Coordination   Follow Up Visit Note   02/12/2023 Name: Christine Mcdowell MRN: [id number] DOB: 02/21/1971  Christine Mcdowell is a 52 y.o. year old female who sees [name], [place] for primary care. I spoke with  [name] by phone today.  What matters to the patients health and wellness today?  Patient will complete a sleep study for evaluation of OSA. She will contact ENT to schedule a new patient appointment.     Goals Addressed             This Visit's Progress    To better manage chronic sinusitis       Care Coordination Interventions: Evaluation of current treatment plan related to chronic sinusitis  and patient's adherence to plan as established by provider Determined patient received a call from an ENT provider, however she missed the call Determined patient has the contact information and plans to contact this provider in the near future to schedule her new patient appointment         To complete a sleep study for evaluation of OSA       Care Coordination Interventions: Evaluation of current treatment plan related to OSA  and patient's adherence to plan as established by provider Determined patient will undergo a repeat sleep study, this is pending insurance authorization Educated patient about the [place] home sleep study, provided patient with the website to review Instructed patient to contact her Cardiologist provider to request this sleep study if she prefers            SDOH assessments and interventions completed:  No     Care Coordination Interventions:  Yes, provided   Follow up plan: Follow up call scheduled for 03/12/23 @10$ :30 AM    Encounter Outcome:  Pt. Visit Completed

## 2023-02-20 ENCOUNTER — Other Ambulatory Visit (HOSPITAL_COMMUNITY): Payer: Self-pay | Admitting: Internal Medicine

## 2023-02-21 ENCOUNTER — Ambulatory Visit (INDEPENDENT_AMBULATORY_CARE_PROVIDER_SITE_OTHER): Payer: 59 | Admitting: Pulmonary Disease

## 2023-02-21 ENCOUNTER — Encounter (HOSPITAL_BASED_OUTPATIENT_CLINIC_OR_DEPARTMENT_OTHER): Payer: Self-pay | Admitting: Pulmonary Disease

## 2023-02-21 VITALS — BP 122/80 | HR 69 | Temp 97.6°F | Ht 66.0 in | Wt 194.8 lb

## 2023-02-21 DIAGNOSIS — J329 Chronic sinusitis, unspecified: Secondary | ICD-10-CM | POA: Diagnosis not present

## 2023-02-21 NOTE — Assessment & Plan Note (Signed)
She has recurrent chronic sinusitis, fortunately this is not caused bronchitis or worsening her asthma.  In fact she is off her maintenance inhaler now. She has received multiple rounds of antibiotics but still has persistent symptoms. Would recommend postradiation antihistaminic and decongestant combination for a couple of weeks and proceed with CT sinuses and ENT referral to check for maxillary sinus fluid collection. She will contact us should she develop recurrent bronchitis or wheezing

## 2023-02-21 NOTE — Progress Notes (Signed)
   Subjective:    Patient ID: Christine Mcdowell, female    DOB: 1971/11/13, 52 y.o.   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   She reports recurrent episodes of bronchitis starting as a child, she has previously lived in [place] and [place] and was recurrently requiring antibiotics for complicated sinus infection and bronchitis she was then diagnosed with lupus in 2012 and had a severe episode of bronchitis in 2018 On and off acei since 01/2014 and on entresto since 06/2017  Meds without benefit include Advair, Breo, Symbicort and Singulair. She feels like Combivent helps the best.     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 Has been on multiple rounds of abx but still not any better, having complaints of sinus headaches, sneezing, watery eyes. Denies any complaints of wheezing or coughing.    Last seen 11/2021 -she was having an episode of bronchitis which we treated and subsequently she was able to come off Singulair and Symbicort Interim: Lupus was flaring up and in addition to methotrexate she is now maintained on 5 mg of prednisone and baricitinib has been added. She reports recurrent sinus symptoms and has received multiple rounds of antibiotics.  Cetirizine and azelastine/fluticasone nasal sprays were not working and she has discontinued. She continues on aspirin daily. She remains on Entresto and denies cough  or wheezing. She has not needed her albuterol much.    Significant tests/ events reviewed   02/2022 CTA chest chronic lingula and left lower lobe scarring 09/2021 CT angiogram chest clear lungs   10/2019 CT angiogram chest left lower lobe airspace disease Allergy profile 01/19/2018 >  Eos 0.0 /  IgE  2 RAST neg - Sinus CT 08/05/2018 neg    08/2018 CPET -spirometry showed no airway obstruction with ratio 88, FEV1 2.17/81%, FVC 74%   Review of Systems neg for any significant sore throat, dysphagia, itching, sneezing, nasal congestion or excess/ purulent secretions, fever, chills, sweats, unintended wt loss, pleuritic or exertional cp, hempoptysis, orthopnea pnd or change in chronic leg swelling. Also denies presyncope, palpitations, heartburn, abdominal pain, nausea, vomiting, diarrhea or change in bowel or urinary habits, dysuria,hematuria, rash, arthralgias, visual complaints, headache, numbness weakness or ataxia.     Objective:   Physical Exam  Gen. Pleasant, well-nourished, in no distress ENT - no thrush, no pallor/icterus,no post nasal drip, enlarged nasal turbinates Neck: No JVD, no thyromegaly, no carotid bruits Lungs: no use of accessory muscles, no dullness to percussion, clear without rales or rhonchi  Cardiovascular: Rhythm regular, heart sounds  normal, no murmurs or gallops, no peripheral edema Musculoskeletal: No deformities, no cyanosis or clubbing        Assessment & Plan:

## 2023-02-21 NOTE — Patient Instructions (Signed)
  Trial of chlorpheniramine 4 mg at bedtime x 3 weeks + phenylephrine 10 mg daytime  as decongestant (combination [employer] brand 'sinus PE')  X Limitred CT sinuses  X ENT referral for chronic sinusitis

## 2023-02-26 ENCOUNTER — Ambulatory Visit: Payer: 59

## 2023-02-26 ENCOUNTER — Other Ambulatory Visit: Payer: Self-pay | Admitting: Nurse Practitioner

## 2023-02-26 DIAGNOSIS — N951 Menopausal and female climacteric states: Secondary | ICD-10-CM

## 2023-02-26 DIAGNOSIS — I420 Dilated cardiomyopathy: Secondary | ICD-10-CM

## 2023-02-26 LAB — CUP PACEART REMOTE DEVICE CHECK
Battery Remaining Longevity: 132 mo
Battery Remaining Percentage: 92 %
Brady Statistic RV Percent Paced: 0 %
Date Time Interrogation Session: 20240306045100
HighPow Impedance: 73 Ohm
Implantable Lead Connection Status: 753985
Implantable Lead Implant Date: 20190206
Implantable Lead Location: 753860
Implantable Lead Model: 292
Implantable Lead Serial Number: 438194
Implantable Pulse Generator Implant Date: 20190206
Lead Channel Impedance Value: 570 Ohm
Lead Channel Setting Pacing Amplitude: 2.5 V
Lead Channel Setting Pacing Pulse Width: 0.4 ms
Lead Channel Setting Sensing Sensitivity: 0.5 mV
Pulse Gen Serial Number: 243572
Zone Setting Status: 755011

## 2023-02-26 MED ORDER — VEOZAH 45 MG PO TABS: 1.0000 | ORAL_TABLET | Freq: Every day | ORAL | 3 refills | Status: DC

## 2023-02-26 NOTE — Progress Notes (Signed)
Sent Rx for [employer] to [facility]

## 2023-02-28 ENCOUNTER — Ambulatory Visit (HOSPITAL_BASED_OUTPATIENT_CLINIC_OR_DEPARTMENT_OTHER)
Admission: RE | Admit: 2023-02-28 | Discharge: 2023-02-28 | Disposition: A | Discharge status: Home / Self Care | Payer: 59 | Source: Ambulatory Visit | Attending: Pulmonary Disease | Admitting: Pulmonary Disease

## 2023-02-28 ENCOUNTER — Other Ambulatory Visit (HOSPITAL_BASED_OUTPATIENT_CLINIC_OR_DEPARTMENT_OTHER): Payer: Self-pay | Admitting: Pulmonary Disease

## 2023-02-28 DIAGNOSIS — J329 Chronic sinusitis, unspecified: Secondary | ICD-10-CM | POA: Diagnosis not present

## 2023-02-28 DIAGNOSIS — R002 Palpitations: Secondary | ICD-10-CM | POA: Diagnosis not present

## 2023-03-03 NOTE — Addendum Note (Signed)
Encounter addended by: Micki Riley, RN on: 03/03/2023 1:21 PM  Actions taken: Imaging Exam ended

## 2023-03-04 ENCOUNTER — Telehealth: Payer: Self-pay | Admitting: Internal Medicine

## 2023-03-04 NOTE — Telephone Encounter (Signed)
Called patient regarding upcoming April appointments, patient is notified.

## 2023-03-10 ENCOUNTER — Other Ambulatory Visit: Payer: Self-pay | Admitting: Nurse Practitioner

## 2023-03-12 ENCOUNTER — Encounter (HOSPITAL_BASED_OUTPATIENT_CLINIC_OR_DEPARTMENT_OTHER): Payer: Self-pay | Admitting: Pulmonary Disease

## 2023-03-12 ENCOUNTER — Encounter (HOSPITAL_COMMUNITY): Payer: Self-pay | Admitting: Internal Medicine

## 2023-03-12 ENCOUNTER — Ambulatory Visit: Payer: Self-pay

## 2023-03-12 DIAGNOSIS — J329 Chronic sinusitis, unspecified: Secondary | ICD-10-CM

## 2023-03-12 DIAGNOSIS — G4733 Obstructive sleep apnea (adult) (pediatric): Secondary | ICD-10-CM

## 2023-03-12 NOTE — Telephone Encounter (Signed)
Christine Noel, MD  You25 minutes ago (3:43 PM)    Please send referral to ENT Dr. Benjamine Mola or [facility]

## 2023-03-12 NOTE — Telephone Encounter (Signed)
[place] message sent by pt:  Christine Mcdowell  P [facility] Clinical Pool (supporting Kara Mead V, MD)33 minutes ago (11:28 AM)    Great morning! I called the office of Dr Clelia Croft and they have not received the referral.  In addition they can not see me until May.  Can you please refer me to someone else. I've been suffering for about 8 months and do not want to continue until May.  I'm open to going to [place], [place], [place] or [place] for further care.     Thank you so much!!    Dr. Elsworth Soho, please advise.

## 2023-03-12 NOTE — Patient Instructions (Addendum)
Visit Information  Thank you for taking time to visit with me today. Please don't hesitate to contact me if I can be of assistance to you.   Following are the goals we discussed today:   Goals Addressed             This Visit's Progress    To better manage chronic sinusitis       Care Coordination Interventions: Evaluation of current treatment plan related to chronic sinusitis  and patient's adherence to plan as established by provider Determined patient completed a maxillofacial CT for evaluation of chronic sinusitis Discussed with patient she was notified of the results that confirm her dx of chronic sinusitis Reviewed and discussed ENT referral placed on 02/21/23, provided patient with the name of the provider and contact number in order to call and schedule her initial appointment   Determined patient contacted this provider and she has yet to receive the referral  Reviewed the following communication between patient and Dr. Elsworth Soho advising patient the referral has been resent to Dr. Benjamine Mola in [place]       To complete a sleep study for evaluation of OSA       Care Coordination Interventions: Evaluation of current treatment plan related to OSA  and patient's adherence to plan as established by provider Discussed with patient she is yet to hear from Cardiology regarding her sleep study Determined patient has been waiting to have this study completed for months and it has not addressed by Cardiology Discussed this RN will outreach to Cardiology to inquire about her sleep study  Noted the following update per Cardiology dated today via my chart to patient;  Hello 1.sleep study-at your last OV the provider wanted you to be seen by the cardiologist that manages sleep apnea or sleep management. Unfortunately the wrong order was placed, I will send a new order today for you to be seen by [facility] Dr Radford Pax. I do believe Dr Radford Pax is out on medical leave but the office has staff in  place to cover in her absence. If a new sleep study is needed Dr Landis Gandy staff will order. 2. Your zio monitor has been received however they have not been reviewed by your doctor. Once they have been reviewed should anything come up abnormal or if a medication change is needed, we will be in contact.  Please let me know if you have any questions and I will be more than happy to assist.   To follow up on PA for [place] Coordination Interventions: Determined patient received a call from specialty pharmacy advising her Willette Pa was denied  Sent in basket message to [place] D requesting she follow up with patient to offer assistance         Our next appointment is by telephone on 03/26/23 at 1:00 PM  Please call the care guide team at [phone] if you need to cancel or reschedule your appointment.   If you are experiencing a Mental Health or [place] or need someone to talk to, please call 1-800-273-TALK (toll free, 24 hour hotline) go to [facility] [address], [place] [phone])  Patient verbalizes understanding of instructions and care plan provided today and agrees to view in [place]. Active MyChart status and patient understanding of how to access instructions and care plan via MyChart confirmed with patient.     Barb Merino, RN, BSN, CCM Care Management Coordinator [facility] Care Management  Direct Phone: [phone]

## 2023-03-12 NOTE — Patient Outreach (Signed)
Care Coordination   Follow Up Visit Note   03/12/2023 Name: Christine Mcdowell MRN: [id number] DOB: [birth year]  Christine Mcdowell is a 52 y.o. year old female who sees Christine Mcdowell, [place] for primary care. I spoke with  Christine Mcdowell by phone today.  What matters to the patients health and wellness today?  Patient will f/u with ENT for evaluation of chronic sinusitis. She will f/u with Cardiology regarding a sleep study.     Goals Addressed             This Visit's Progress    To better manage chronic sinusitis       Care Coordination Interventions: Evaluation of current treatment plan related to chronic sinusitis  and patient's adherence to plan as established by provider Determined patient completed a maxillofacial CT for evaluation of chronic sinusitis Discussed with patient she was notified of the results that confirm her dx of chronic sinusitis Reviewed and discussed ENT referral placed on 02/21/23, provided patient with the name of the provider and contact number in order to call and schedule her initial appointment   Determined patient contacted this provider and she has yet to receive the referral  Reviewed the following communication between patient and Christine Mcdowell advising patient the referral has been resent to Christine Mcdowell in [place]       To complete a sleep study for evaluation of OSA       Care Coordination Interventions: Evaluation of current treatment plan related to OSA  and patient's adherence to plan as established by provider Discussed with patient she is yet to hear from Cardiology regarding her sleep study Determined patient has been waiting to have this study completed for months and it has not addressed by Cardiology Discussed this RN will outreach to Cardiology to inquire about her sleep study  Noted the following update per Cardiology dated today via my chart to patient;  Hello 1.sleep study-at your last OV the provider wanted you to be seen by the  cardiologist that manages sleep apnea or sleep management. Unfortunately the wrong order was placed, I will send a new order today for you to be seen by [facility] Christine Mcdowell. I do believe Christine Mcdowell is out on medical leave but the office has staff in place to cover in her absence. If a new sleep study is needed Christine Mcdowell staff will order. 2. Your zio monitor has been received however they have not been reviewed by your doctor. Once they have been reviewed should anything come up abnormal or if a medication change is needed, we will be in contact.  Please let me know if you have any questions and I will be more than happy to assist.   To follow up on PA for [place] Coordination Interventions: Determined patient received a call from specialty pharmacy advising her Willette Pa was denied  Sent in basket message to Christine Mcdowell Pharm D requesting she follow up with patient to offer assistance     Interventions Today    Flowsheet Row Most Recent Value  Chronic Disease   Chronic disease during today's visit Other  [OSA, sinusitis]  General Interventions   General Interventions Discussed/Reviewed General Interventions Discussed, General Interventions Reviewed, Doctor Visits  Doctor Visits Discussed/Reviewed Doctor Visits Discussed, Doctor Visits Reviewed, Specialist  Education Interventions   Education Provided Provided Education  Provided Verbal Education On Medication, When to see the doctor      SDOH assessments and interventions completed:  No     Care  Coordination Interventions:  Yes, provided   Follow up plan: Follow up call scheduled for 03/26/23 @1 :00 PM    Encounter Outcome:  Pt. Visit Completed

## 2023-03-13 ENCOUNTER — Telehealth: Payer: Self-pay

## 2023-03-13 NOTE — Telephone Encounter (Signed)
Will send to Dr. Ilda Foil office's, I called and scheduled her an appt on 4/30 at 9:15am    Dr. Benjamine Mola does not accept throat/mouth/neck issue patients at this time. I was informed via fax   Since [facility] was booked til May, I'll call and let the pt know about the consult I have set for her.

## 2023-03-13 NOTE — Progress Notes (Cosign Needed)
Message received from [place] and [place] with inquires on patient assistance for [facility] for patient. I have researched for assistance with [place] and unfortunately it is not an easy process, the patient will need to go online to apply for assistance with non commercial insurance but from what I can gather with her having medicaid, she won't be able to qualify but she could try. Message sent back to Pharm D and [facility] RN for follow up.   Pattricia Boss, [place] Pharmacist Assistant [phone]

## 2023-03-14 DIAGNOSIS — E1165 Type 2 diabetes mellitus with hyperglycemia: Secondary | ICD-10-CM | POA: Diagnosis not present

## 2023-03-17 ENCOUNTER — Other Ambulatory Visit (HOSPITAL_COMMUNITY): Payer: Self-pay | Admitting: Cardiology

## 2023-03-17 DIAGNOSIS — G4733 Obstructive sleep apnea (adult) (pediatric): Secondary | ICD-10-CM

## 2023-03-17 NOTE — Progress Notes (Signed)
Orders for sleep study placed

## 2023-03-19 ENCOUNTER — Telehealth: Payer: Self-pay | Admitting: *Deleted

## 2023-03-19 DIAGNOSIS — I10 Essential (primary) hypertension: Secondary | ICD-10-CM

## 2023-03-19 DIAGNOSIS — I5022 Chronic systolic (congestive) heart failure: Secondary | ICD-10-CM

## 2023-03-19 DIAGNOSIS — G4733 Obstructive sleep apnea (adult) (pediatric): Secondary | ICD-10-CM

## 2023-03-19 DIAGNOSIS — I509 Heart failure, unspecified: Secondary | ICD-10-CM

## 2023-03-19 NOTE — Telephone Encounter (Signed)
NPSG ordered per Dr Haroldine Laws.

## 2023-03-20 DIAGNOSIS — N951 Menopausal and female climacteric states: Secondary | ICD-10-CM

## 2023-03-26 ENCOUNTER — Telehealth: Payer: 59 | Admitting: Nurse Practitioner

## 2023-03-26 ENCOUNTER — Telehealth: Payer: Self-pay | Admitting: *Deleted

## 2023-03-26 DIAGNOSIS — J0100 Acute maxillary sinusitis, unspecified: Secondary | ICD-10-CM | POA: Diagnosis not present

## 2023-03-26 MED ORDER — DOXYCYCLINE HYCLATE 100 MG PO TABS: 100.0000 mg | ORAL_TABLET | Freq: Two times a day (BID) | ORAL | 0 refills | Status: AC

## 2023-03-26 MED ORDER — ALBUTEROL SULFATE HFA 108 (90 BASE) MCG/ACT IN AERS: 2.0000 | INHALATION_SPRAY | Freq: Four times a day (QID) | RESPIRATORY_TRACT | 0 refills | Status: AC | PRN

## 2023-03-26 MED ORDER — PREDNISONE 20 MG PO TABS: 20.0000 mg | ORAL_TABLET | Freq: Two times a day (BID) | ORAL | 0 refills | Status: AC

## 2023-03-26 MED ORDER — IPRATROPIUM BROMIDE 0.03 % NA SOLN: 2.0000 | Freq: Two times a day (BID) | NASAL | 12 refills | Status: DC

## 2023-03-26 NOTE — Progress Notes (Signed)
Virtual Visit Consent   Christine Mcdowell, you are scheduled for a virtual visit with a [facility] provider today. Just as with appointments in the office, your consent must be obtained to participate. Your consent will be active for this visit and any virtual visit you may have with one of our providers in the next 365 days. If you have a MyChart account, a copy of this consent can be sent to you electronically.  As this is a virtual visit, video technology does not allow for your provider to perform a traditional examination. This may limit your provider's ability to fully assess your condition. If your provider identifies any concerns that need to be evaluated in person or the need to arrange testing (such as labs, EKG, etc.), we will make arrangements to do so. Although advances in technology are sophisticated, we cannot ensure that it will always work on either your end or our end. If the connection with a video visit is poor, the visit may have to be switched to a telephone visit. With either a video or telephone visit, we are not always able to ensure that we have a secure connection.  By engaging in this virtual visit, you consent to the provision of healthcare and authorize for your insurance to be billed (if applicable) for the services provided during this visit. Depending on your insurance coverage, you may receive a charge related to this service.  I need to obtain your verbal consent now. Are you willing to proceed with your visit today? Tatyanah Frane has provided verbal consent on 03/26/2023 for a virtual visit (video or telephone). Apolonio Schneiders, FNP  Date: 03/26/2023 2:00 PM  Virtual Visit via Video Note   I, Apolonio Schneiders, connected with  Shandiin Woolums  ([id number], 10/17/1971) on 03/26/23 at  2:00 PM EDT by a video-enabled telemedicine application and verified that I am speaking with the correct person using two identifiers.  Location: Patient: Virtual Visit Location  Patient: Home Provider: Virtual Visit Location Provider: Home Office   I discussed the limitations of evaluation and management by telemedicine and the availability of in person appointments. The patient expressed understanding and agreed to proceed.    History of Present Illness: Christine Mcdowell is a 52 y.o. who identifies as a female who was assigned female at birth, and is being seen today for allergies.  She has been suffering from worsening allergies for the past week Sneezing, watery eyes, and a cough - her cough has more recently become productive  This has been getting worse  She now has a headache and sinus pressure throughout her face   She has been using an OTC allergy antihistamine and nasal spray   She does have an upcoming ENT appointment to discuss her allergies and sinuses   She has used an inhaler in the past and has a history of asthma   Problems:  Patient Active Problem List   Diagnosis Date Noted   Chronic sinusitis 02/21/2023   Allergic rhinitis 01/27/2023   Bronchitis 01/27/2023   Depression 01/27/2023   Impaired functional mobility, balance, gait, and endurance 01/27/2023   Night sweats 01/27/2023   Skin change 01/27/2023   Anemia 03/21/2022   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   History of COVID-19 04/12/2020   Acquired hallux valgus, left 04/10/2020   COVID-19 virus infection 03/07/2020   Lower abdominal pain 09/07/2019   Primary osteoarthritis of right knee 0000000   Chronic systolic (congestive) heart failure 01/28/2018  Cough 01/19/2018   S/P ACL reconstruction 10/09/2017   Migraines 10/08/2017   Sleep apnea with use of continuous positive airway pressure (CPAP) 09/24/2017   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   Chronic pain of right knee 08/11/2017   Rheumatoid arthritis 10/09/2016   Asthma 10/09/2016   Chronic pain 10/09/2016   Heart failure 10/09/2016   Congestive heart failure     Lupus (systemic lupus erythematosus)    Diabetes mellitus with complication    Anxiety state    GERD (gastroesophageal reflux disease) 12/28/2015   Essential hypertension 12/26/2015   Type 2 diabetes mellitus 12/26/2015   Long term current use of systemic steroids 07/17/2015   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   Chronic systolic heart failure [id number]   Cardiomyopathy, 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   Shortness of breath 01/26/2014   SLE (systemic lupus erythematosus) 01/26/2014    Allergies:  Allergies  Allergen Reactions   Hydrocodone-Acetaminophen Nausea And Vomiting   Hydromorphone Nausea And Vomiting    Other reaction(s): GI Upset (intolerance), Hypertension (intolerance) Raises blood pressure  Other reaction(s): GI Upset (intolerance), Hypertension (intolerance) Raises blood pressure to stroke level   Iodinated Contrast Media Other (See Comments)    Shuts down kidneys Shuts kidney function down   Other Other (See Comments) and Anaphylaxis    Spicy foods and seasonings Skin Prep "makes my skin peel off" Paper tape causes skin burns   Erythromycin Nausea And Vomiting   Latex Hives   Rinvoq [Upadacitinib] Other (See Comments)   Farxiga [Dapagliflozin] Other (See Comments)   Tape Other (See Comments)    "Skin burns"   Mircette [Desogestrel-Ethinyl Estradiol] Nausea And Vomiting and Rash   Medications:  Current Outpatient Medications:    Fezolinetant (VEOZAH) 45 MG TABS, Take 1 tablet (45 mg total) by mouth daily., Disp: 30 tablet, Rfl: 3   aspirin 81 MG chewable tablet, Chew 162 mg by mouth daily. Taking 2 tablets, Disp: , Rfl:    atorvastatin (LIPITOR) 10 MG tablet, TAKE 1 TABLET BY MOUTH ONCE  DAILY, Disp: 100 tablet, Rfl: 2   carvedilol (COREG) 25 MG tablet, TAKE 1 TABLET BY MOUTH IN THE  MORNING AND 1 TABLET BY MOUTH IN THE EVENING, Disp: 60 tablet, Rfl: 11   Continuous Blood Gluc Sensor (DEXCOM G7 SENSOR) MISC, USE TO CHECK BLOOD GLUCOSE AS  DIRECTED 3 TIMES DAILY AND   CHANGE SENSOR EVERY 10 DAYS, Disp: 10 each, Rfl: 2   diclofenac Sodium (VOLTAREN) 1 % GEL, apply A SMALL AMOUNT TO involved joints UP TO TWICE DAILY, Disp: , Rfl:    ENTRESTO 97-103 MG, TAKE 1 TABLET BY MOUTH TWICE  DAILY, Disp: 60 tablet, Rfl: 11   folic acid (FOLVITE) 1 MG tablet, TAKE 1 TABLET BY MOUTH EVERY MORNING, Disp: 30 tablet, Rfl: 0   Glucagon (GVOKE HYPOPEN 2-PACK) 1 MG/0.2ML SOAJ, Inject 1 mg into the skin as needed., Disp: 0.2 mL, Rfl: 5   hydroxychloroquine (PLAQUENIL) 200 MG tablet, Take 1 tablet (200 mg total) by mouth 2 (two) times daily., Disp: 180 tablet, Rfl: 0   Insulin Pen Needle (NOVOFINE PLUS PEN NEEDLE) 32G X 4 MM MISC, Use with insulin pens dx code e11.65, Disp: 300 each, Rfl: 3   Ipratropium-Albuterol (COMBIVENT RESPIMAT) 20-100 MCG/ACT AERS respimat, Inhale 1 puff into the lungs every 6 (six) hours., Disp: 4 g, Rfl: 5   ipratropium-albuterol (DUONEB) 0.5-2.5 (3) MG/3ML SOLN, Take 3 mLs by nebulization every 6 (six) hours as needed., Disp: 360 mL, Rfl: 2   Magnesium 400 MG TABS, Take  1 tablet by mouth daily., Disp: 90 tablet, Rfl: 1   methotrexate (RHEUMATREX) 2.5 MG tablet, Take 20 mg by mouth every Friday. , Disp: , Rfl: 3   metolazone (ZAROXOLYN) 2.5 MG tablet, TAKE 1 TABLET BY MOUTH AS NEEDED FOR WEIGHT GAIN OF 5 POUNDS WITHIN 3 DAYS AS DIRECTED needs appt for further refills, Disp: 5 tablet, Rfl: 0   Multiple Vitamin (MULTIVITAMIN WITH MINERALS) TABS, Take 1 tablet by mouth every morning. , Disp: , Rfl:    OLUMIANT tablet, Take 2 mg by mouth daily., Disp: , Rfl:    ondansetron (ZOFRAN-ODT) 8 MG disintegrating tablet, Take 1 tablet (8 mg total) by mouth every 8 (eight) hours as needed for nausea or vomiting., Disp: 10 tablet, Rfl: 0   potassium chloride (KLOR-CON M) 10 MEQ tablet, TAKE 3 TABLETS BY MOUTH TWICE  DAILY, Disp: 360 tablet, Rfl: 5   predniSONE (DELTASONE) 5 MG tablet, Take by mouth., Disp: , Rfl:    PROAIR HFA 108 (90 Base) MCG/ACT inhaler, INHALE 2 PUFFS  BY MOUTH EVERY DAY AS NEEDED FOR SHORTNESS OF BREATH, Disp: 18 g, Rfl: 2   spironolactone (ALDACTONE) 25 MG tablet, TAKE 1 TABLET BY MOUTH IN THE  MORNING AND 1 TABLET BY MOUTH IN THE EVENING, Disp: 60 tablet, Rfl: 11   TRESIBA FLEXTOUCH 100 UNIT/ML FlexTouch Pen, INJECT SUBCUTANEOUSLY INTO SKIN  10 UNITS DAILY, Disp: 15 mL, Rfl: 2   Vitamin D, Ergocalciferol, (DRISDOL) 1.25 MG (50000 UNIT) CAPS capsule, Take 1 capsule (50,000 Units total) by mouth 2 (two) times a week., Disp: 12 capsule, Rfl: 3  Observations/Objective: Patient is well-developed, well-nourished in no acute distress.  Resting comfortably  at home.  Head is normocephalic, atraumatic.  No labored breathing.  Speech is clear and coherent with logical content.  Patient is alert and oriented at baseline.    Assessment and Plan: 1. Acute non-recurrent maxillary sinusitis  Follow up with ENT as scheduled  Avoid NSAIDs while on prednisone  Take antibiotic with food  Take prednisone with food    - albuterol (VENTOLIN HFA) 108 (90 Base) MCG/ACT inhaler; Inhale 2 puffs into the lungs every 6 (six) hours as needed for wheezing or shortness of breath.  Dispense: 8 g; Refill: 0 - predniSONE (DELTASONE) 20 MG tablet; Take 1 tablet (20 mg total) by mouth 2 (two) times daily with a meal for 5 days.  Dispense: 10 tablet; Refill: 0 - ipratropium (ATROVENT) 0.03 % nasal spray; Place 2 sprays into both nostrils every 12 (twelve) hours.  Dispense: 30 mL; Refill: 12 - doxycycline (VIBRA-TABS) 100 MG tablet; Take 1 tablet (100 mg total) by mouth 2 (two) times daily for 10 days.  Dispense: 20 tablet; Refill: 0     Follow Up Instructions: I discussed the assessment and treatment plan with the patient. The patient was provided an opportunity to ask questions and all were answered. The patient agreed with the plan and demonstrated an understanding of the instructions.  A copy of instructions were sent to the patient via MyChart unless otherwise  noted below.    The patient was advised to call back or seek an in-person evaluation if the symptoms worsen or if the condition fails to improve as anticipated.  Time:  I spent 15 minutes with the patient via telehealth technology discussing the above problems/concerns.    Apolonio Schneiders, FNP

## 2023-03-26 NOTE — Progress Notes (Signed)
  Care Coordination Note  03/26/2023 Name: Suhana Crete MRN: [id number] DOB: 07-05-1971  Petria Seamon is a 52 y.o. year old female who is a primary care patient of [name], [place] and is actively engaged with the care management team. I reached out to [name] by phone today to assist with re-scheduling a follow up visit with the RN Case Manager  Follow up plan: Unsuccessful telephone outreach attempt made. A HIPAA compliant phone message was left for the patient providing contact information and requesting a return call.  [place]  Direct Dial: [phone]

## 2023-03-31 NOTE — Progress Notes (Signed)
  Care Coordination Note  03/31/2023 Name: [name] MRN: [id number] DOB: Jul 19, 1971  Christine Mcdowell is a 52 y.o. year old female who is a primary care patient of Arnette Felts, FNP and is actively engaged with the care management team. I reached out to [name] by phone today to assist with re-scheduling a follow up visit with the RN Case Manager  Follow up plan: Telephone appointment with care management team member scheduled for:05/05/23  [facility] Coordination Care Guide  Direct Dial: [phone]

## 2023-04-02 DIAGNOSIS — Z794 Long term (current) use of insulin: Secondary | ICD-10-CM | POA: Diagnosis not present

## 2023-04-02 DIAGNOSIS — E119 Type 2 diabetes mellitus without complications: Secondary | ICD-10-CM | POA: Diagnosis not present

## 2023-04-02 NOTE — Progress Notes (Signed)
Remote ICD transmission.   

## 2023-04-05 NOTE — Progress Notes (Unsigned)
  Electrophysiology Office Note:   Date:  04/07/2023  ID:  Takela Ganter, DOB December 13, 1971, MRN [id number]  Primary Cardiologist: Arvilla Meres, MD Electrophysiologist: Lewayne Bunting, MD   History of Present Illness:   Christine Mcdowell is a 52 y.o. female with h/o chronic systolic CHF, HTN, and VT/NSVT seen today for routine electrophysiology followup.   Was seen in the ED 4/14 for weakness and dizziness. Work up overall unremarkable, so slight WBC and anemia noted. .   Since last being seen in our clinic the patient reports doing very well from a cardiac perspective.  she denies chest pain, palpitations, dyspnea, PND, orthopnea, nausea, vomiting, syncope, edema, weight gain, or early satiety.  She has had intermittent dizziness since having COVID -> sinus issues earlier this year. Scheduled for ENT consult at the end of the month.   Review of systems complete and found to be negative unless listed in HPI.   Device History: Magazine features editor ICD implanted 01/2018 for Chronic systolic CHF   Studies Reviewed:    ICD Interrogation-  reviewed in detail today,  See PACEART report.  EKG is not ordered today. EKG from 04/06/2023 reviewed which showed NSR at 69 bpm with 1st degree AV block    Monitor 02/2023 with brief SVT and rare ectopy.  Risk Assessment/Calculations:           Physical Exam:   VS:  BP 110/65 (BP Location: Left Arm, Patient Position: Sitting, Cuff Size: Normal)   Pulse 78   Ht 5\' 6"  (1.676 m)   SpO2 97%   BMI 31.46 kg/m    Wt Readings from Last 3 Encounters:  04/06/23 194 lb 14.2 oz (88.4 kg)  02/21/23 194 lb 12.8 oz (88.4 kg)  02/04/23 192 lb 9.6 oz (87.4 kg)     GEN: Well nourished, well developed in no acute distress NECK: No JVD; No carotid bruits CARDIAC: Regular rate and rhythm, no murmurs, rubs, gallops RESPIRATORY:  Clear to auscultation without rales, wheezing or rhonchi  ABDOMEN: Soft, non-tender, non-distended EXTREMITIES:  No  edema; No deformity   ASSESSMENT AND PLAN:    Chronic systolic dysfunction s/p [employer] single chamber ICD  EF 03/2022 LVEF 45% NYHA II symptoms euvolemic today Stable on an appropriate medical regimen Normal ICD function See Pace Art report No changes today  VT/NSVT None recently (most recent 01/2023) Monitor 02/2023 with brief SVT and rare ectopy.   HTN Stable on current regimen    Disposition:   Follow up with Dr. Ladona Ridgel in 12 months   Signed, Graciella Freer, PA-C

## 2023-04-06 ENCOUNTER — Emergency Department (HOSPITAL_COMMUNITY)
Admission: EM | Admit: 2023-04-06 | Discharge: 2023-04-06 | Disposition: A | Discharge status: Home / Self Care | Payer: 59 | Attending: Emergency Medicine | Admitting: Emergency Medicine

## 2023-04-06 ENCOUNTER — Emergency Department (HOSPITAL_COMMUNITY): Payer: 59

## 2023-04-06 DIAGNOSIS — I509 Heart failure, unspecified: Secondary | ICD-10-CM | POA: Diagnosis not present

## 2023-04-06 DIAGNOSIS — Z95810 Presence of automatic (implantable) cardiac defibrillator: Secondary | ICD-10-CM | POA: Insufficient documentation

## 2023-04-06 DIAGNOSIS — M6281 Muscle weakness (generalized): Secondary | ICD-10-CM | POA: Diagnosis not present

## 2023-04-06 DIAGNOSIS — Z743 Need for continuous supervision: Secondary | ICD-10-CM | POA: Diagnosis not present

## 2023-04-06 DIAGNOSIS — Z1152 Encounter for screening for COVID-19: Secondary | ICD-10-CM | POA: Diagnosis not present

## 2023-04-06 DIAGNOSIS — Z7982 Long term (current) use of aspirin: Secondary | ICD-10-CM | POA: Insufficient documentation

## 2023-04-06 DIAGNOSIS — R42 Dizziness and giddiness: Secondary | ICD-10-CM | POA: Insufficient documentation

## 2023-04-06 DIAGNOSIS — Z794 Long term (current) use of insulin: Secondary | ICD-10-CM | POA: Insufficient documentation

## 2023-04-06 DIAGNOSIS — Z79899 Other long term (current) drug therapy: Secondary | ICD-10-CM | POA: Diagnosis not present

## 2023-04-06 DIAGNOSIS — E119 Type 2 diabetes mellitus without complications: Secondary | ICD-10-CM | POA: Diagnosis not present

## 2023-04-06 DIAGNOSIS — R059 Cough, unspecified: Secondary | ICD-10-CM | POA: Diagnosis not present

## 2023-04-06 DIAGNOSIS — I499 Cardiac arrhythmia, unspecified: Secondary | ICD-10-CM | POA: Diagnosis not present

## 2023-04-06 DIAGNOSIS — Z91040 Latex allergy status: Secondary | ICD-10-CM | POA: Insufficient documentation

## 2023-04-06 DIAGNOSIS — I110 Hypertensive heart disease with heart failure: Secondary | ICD-10-CM | POA: Diagnosis not present

## 2023-04-06 DIAGNOSIS — J45909 Unspecified asthma, uncomplicated: Secondary | ICD-10-CM | POA: Diagnosis not present

## 2023-04-06 DIAGNOSIS — R531 Weakness: Secondary | ICD-10-CM | POA: Diagnosis not present

## 2023-04-06 DIAGNOSIS — R404 Transient alteration of awareness: Secondary | ICD-10-CM | POA: Diagnosis not present

## 2023-04-06 LAB — RAPID URINE DRUG SCREEN, HOSP PERFORMED
Amphetamines: NOT DETECTED
Barbiturates: NOT DETECTED
Benzodiazepines: NOT DETECTED
Cocaine: NOT DETECTED
Opiates: NOT DETECTED
Tetrahydrocannabinol: NOT DETECTED

## 2023-04-06 LAB — DIFFERENTIAL
Abs Immature Granulocytes: 0.07 10*3/uL (ref 0.00–0.07)
Basophils Absolute: 0 10*3/uL (ref 0.0–0.1)
Basophils Relative: 0 %
Eosinophils Absolute: 0.1 10*3/uL (ref 0.0–0.5)
Eosinophils Relative: 1 %
Immature Granulocytes: 1 %
Lymphocytes Relative: 7 %
Lymphs Abs: 0.8 10*3/uL (ref 0.7–4.0)
Monocytes Absolute: 0.8 10*3/uL (ref 0.1–1.0)
Monocytes Relative: 7 %
Neutro Abs: 9.8 10*3/uL — ABNORMAL HIGH (ref 1.7–7.7)
Neutrophils Relative %: 84 %

## 2023-04-06 LAB — URINALYSIS, ROUTINE W REFLEX MICROSCOPIC
Bacteria, UA: NONE SEEN
Bilirubin Urine: NEGATIVE
Glucose, UA: >=500 mg/dL — AB
Hgb urine dipstick: NEGATIVE
Ketones, ur: NEGATIVE mg/dL
Leukocytes,Ua: NEGATIVE
Nitrite: NEGATIVE
Protein, ur: NEGATIVE mg/dL
Specific Gravity, Urine: 1.014 (ref 1.005–1.030)
pH: 6 (ref 5.0–8.0)

## 2023-04-06 LAB — CBC
HCT: 30.7 % — ABNORMAL LOW (ref 36.0–46.0)
Hemoglobin: 9.7 g/dL — ABNORMAL LOW (ref 12.0–15.0)
MCH: 29.2 pg (ref 26.0–34.0)
MCHC: 31.6 g/dL (ref 30.0–36.0)
MCV: 92.5 fL (ref 80.0–100.0)
Platelets: 215 10*3/uL (ref 150–400)
RBC: 3.32 MIL/uL — ABNORMAL LOW (ref 3.87–5.11)
RDW: 15.9 % — ABNORMAL HIGH (ref 11.5–15.5)
WBC: 11.5 10*3/uL — ABNORMAL HIGH (ref 4.0–10.5)
nRBC: 0 % (ref 0.0–0.2)

## 2023-04-06 LAB — RESP PANEL BY RT-PCR (RSV, FLU A&B, COVID)  RVPGX2
Influenza A by PCR: NEGATIVE
Influenza B by PCR: NEGATIVE
Resp Syncytial Virus by PCR: NEGATIVE
SARS Coronavirus 2 by RT PCR: NEGATIVE

## 2023-04-06 LAB — COMPREHENSIVE METABOLIC PANEL
ALT: 16 U/L (ref 0–44)
AST: 16 U/L (ref 15–41)
Albumin: 3.8 g/dL (ref 3.5–5.0)
Alkaline Phosphatase: 44 U/L (ref 38–126)
Anion gap: 8 (ref 5–15)
BUN: 18 mg/dL (ref 6–20)
CO2: 26 mmol/L (ref 22–32)
Calcium: 8.4 mg/dL — ABNORMAL LOW (ref 8.9–10.3)
Chloride: 105 mmol/L (ref 98–111)
Creatinine, Ser: 0.88 mg/dL (ref 0.44–1.00)
GFR, Estimated: >60 mL/min (ref >60)
Glucose, Bld: 102 mg/dL — ABNORMAL HIGH (ref 70–99)
Potassium: 3.2 mmol/L — ABNORMAL LOW (ref 3.5–5.1)
Sodium: 139 mmol/L (ref 135–145)
Total Bilirubin: 0.6 mg/dL (ref 0.3–1.2)
Total Protein: 6.5 g/dL (ref 6.5–8.1)

## 2023-04-06 LAB — PROTIME-INR
INR: 1.1 (ref 0.8–1.2)
Prothrombin Time: 14.5 seconds (ref 11.4–15.2)

## 2023-04-06 LAB — TROPONIN I (HIGH SENSITIVITY)
Troponin I (High Sensitivity): 3 ng/L (ref <18)
Troponin I (High Sensitivity): 2 ng/L (ref <18)

## 2023-04-06 LAB — CBG MONITORING, ED: Glucose-Capillary: 270 mg/dL — ABNORMAL HIGH (ref 70–99)

## 2023-04-06 MED ORDER — DIAZEPAM 5 MG/ML IJ SOLN
2.5000 mg | Freq: Once | INTRAMUSCULAR | Status: AC
  Administered 2023-04-06: 2.5 mg via INTRAVENOUS
  Filled 2023-04-06: qty 2

## 2023-04-06 MED ORDER — MECLIZINE HCL 25 MG PO TABS: 25.0000 mg | ORAL_TABLET | Freq: Three times a day (TID) | ORAL | 0 refills | Status: DC | PRN

## 2023-04-06 MED ORDER — SODIUM CHLORIDE 0.9 % IV BOLUS
500.0000 mL | Freq: Once | INTRAVENOUS | Status: AC
  Administered 2023-04-06: 500 mL via INTRAVENOUS

## 2023-04-06 MED ORDER — SODIUM CHLORIDE 0.9 % IV SOLN: 100.0000 mL/h | INTRAVENOUS | Status: DC

## 2023-04-06 MED ORDER — MECLIZINE HCL 12.5 MG PO TABS
25.0000 mg | ORAL_TABLET | Freq: Once | ORAL | Status: AC
  Administered 2023-04-06: 25 mg via ORAL
  Filled 2023-04-06: qty 2

## 2023-04-06 MED ORDER — DIAZEPAM 5 MG PO TABS: 5.0000 mg | ORAL_TABLET | Freq: Two times a day (BID) | ORAL | 0 refills | Status: DC | PRN

## 2023-04-06 NOTE — ED Notes (Signed)
Pt ambulated around the nurses station without assistance

## 2023-04-06 NOTE — ED Triage Notes (Signed)
Pt arrived via [facility] c/o dizziness and generalized weakness after she got out of the shower this morning, Hx CHF, lupus, diabetes. States she checked her cbg and it was 64 denies any injury or falls, denies LOC

## 2023-04-07 ENCOUNTER — Encounter: Payer: Self-pay | Admitting: Student

## 2023-04-07 ENCOUNTER — Ambulatory Visit: Payer: 59 | Attending: Student | Admitting: Student

## 2023-04-07 ENCOUNTER — Telehealth: Payer: Self-pay | Admitting: *Deleted

## 2023-04-07 VITALS — BP 110/65 | HR 78 | Ht 66.0 in

## 2023-04-07 DIAGNOSIS — I4729 Other ventricular tachycardia: Secondary | ICD-10-CM | POA: Diagnosis not present

## 2023-04-07 DIAGNOSIS — I5022 Chronic systolic (congestive) heart failure: Secondary | ICD-10-CM | POA: Diagnosis not present

## 2023-04-07 DIAGNOSIS — I10 Essential (primary) hypertension: Secondary | ICD-10-CM

## 2023-04-07 LAB — CUP PACEART INCLINIC DEVICE CHECK
Date Time Interrogation Session: 20240415123027
HighPow Impedance: 73 Ohm
Implantable Lead Connection Status: 753985
Implantable Lead Implant Date: 20190206
Implantable Lead Location: 753860
Implantable Lead Model: 292
Implantable Lead Serial Number: 438194
Implantable Pulse Generator Implant Date: 20190206
Lead Channel Impedance Value: 567 Ohm
Lead Channel Pacing Threshold Amplitude: 0.7 V
Lead Channel Pacing Threshold Pulse Width: 0.4 ms
Lead Channel Sensing Intrinsic Amplitude: 25 mV
Lead Channel Setting Pacing Amplitude: 2.5 V
Lead Channel Setting Pacing Pulse Width: 0.4 ms
Lead Channel Setting Sensing Sensitivity: 0.5 mV
Pulse Gen Serial Number: 243572
Zone Setting Status: 755011

## 2023-04-07 NOTE — Patient Instructions (Signed)
Medication Instructions:  Your physician recommends that you continue on your current medications as directed. Please refer to the Current Medication list given to you today.  *If you need a refill on your cardiac medications before your next appointment, please call your pharmacy*  Lab Work: None ordered If you have labs (blood work) drawn today and your tests are completely normal, you will receive your results only by: MyChart Message (if you have MyChart) OR A paper copy in the mail If you have any lab test that is abnormal or we need to change your treatment, we will call you to review the results.  Follow-Up: At [facility], you and your health needs are our priority.  As part of our continuing mission to provide you with exceptional heart care, we have created designated Provider Care Teams.  These Care Teams include your primary Cardiologist (physician) and Advanced Practice Providers (APPs -  Physician Assistants and Nurse Practitioners) who all work together to provide you with the care you need, when you need it.  Your next appointment:   1 year(s)  Provider:   Lewayne Bunting, MD

## 2023-04-07 NOTE — Telephone Encounter (Signed)
Spoke with Christine Mcdowell at check out and patient was present. Let her know her study was approved this morning and gave her the sleep lab number [phone] to call and get scheduled for her-self scheduled. Patient was grateful for the help.

## 2023-04-07 NOTE — Telephone Encounter (Signed)
Patient saw Otilio Saber, PA-C today for an office visit and states that she has been waiting for over a year for an in-lab sleep study. Please advise.   Thanks,  Khloie Hamada

## 2023-04-08 ENCOUNTER — Telehealth: Payer: Self-pay

## 2023-04-08 NOTE — Transitions of Care (Post Inpatient/ED Visit) (Signed)
   04/08/2023  Name: Christine Mcdowell MRN: [id number] DOB: [birth year]  Today's TOC FU Call Status: Today's TOC FU Call Status:: Successful TOC FU Call Competed TOC FU Call Complete Date: 04/08/23  Red on EMMI-ED Discharge Alert Date & Reason:04/07/23 "Scheduled follow-up appt? No"   Transition Care Management Follow-up Telephone Call Date of Discharge: 04/06/23 [facility] Type of Discharge: Emergency Department Reason for ED Visit: Other: ("vertigo") How have you been since you were released from the hospital?: Better (Pt states her last episode of vertigo was yesterday evening-she bent over to unleash dog from collar. Sxs relieved with sititng up and being still for a few minutes. She has not been taking much of the Antivert-states it makes her drowsy) Any questions or concerns?: No  Items Reviewed: Did you receive and understand the discharge instructions provided?: Yes Medications obtained and verified?: Yes (Medications Reviewed) Any new allergies since your discharge?: No Dietary orders reviewed?: NA Do you have support at home?: No  Home Care and Equipment/Supplies: Were Home Health Services Ordered?: NA Any new equipment or medical supplies ordered?: NA  Functional Questionnaire: Do you need assistance with bathing/showering or dressing?: No Do you need assistance with meal preparation?: No Do you need assistance with eating?: No Do you have difficulty maintaining continence: No Do you need assistance with getting out of bed/getting out of a chair/moving?: No Do you have difficulty managing or taking your medications?: No  Follow up appointments reviewed: PCP Follow-up appointment confirmed?: No (pt states provider has told her she has done all she can do to treat sxs and needs to follow up with ENT) MD Provider Line Number:[phone] Given: No Specialist Hospital Follow-up appointment confirmed?: Yes Date of Specialist follow-up  appointment?: 04/22/23 (appt made prior to ED visit-pt will call office to see if any sooner appts available since recent ED visit) Follow-Up Specialty Provider:: Dr. Verne Spurr Do you need transportation to your follow-up appointment?: No Do you understand care options if your condition(s) worsen?: Yes-patient verbalized understanding  SDOH Interventions Today    Flowsheet Row Most Recent Value  SDOH Interventions   Food Insecurity Interventions Intervention Not Indicated  Transportation Interventions Intervention Not Indicated      TOC Interventions Today    Flowsheet Row Most Recent Value  TOC Interventions   TOC Interventions Discussed/Reviewed TOC Interventions Discussed      Interventions Today    Flowsheet Row Most Recent Value  General Interventions   General Interventions Discussed/Reviewed General Interventions Discussed, Doctor Visits  Doctor Visits Discussed/Reviewed Doctor Visits Discussed, Specialist, PCP  PCP/Specialist Visits Compliance with follow-up visit  Education Interventions   Education Provided Provided Education  Provided Verbal Education On Nutrition, Medication, When to see the doctor, Other  Pharmacy Interventions   Pharmacy Dicussed/Reviewed Pharmacy Topics Discussed, Medications and their functions  Safety Interventions   Safety Discussed/Reviewed Safety Discussed, Fall Risk        Alessandra Grout [facility] Care Management Care Management Community Coordinator Direct Phone: [phone] Toll Free: [phone] Fax: [phone]

## 2023-04-08 NOTE — ED Provider Notes (Signed)
[place] EMERGENCY DEPARTMENT AT [facility] Provider Note   CSN: [id number] Arrival date & time: 04/06/23  1051     History  Chief Complaint  Patient presents with   Dizziness    Christine Mcdowell is a 52 y.o. female.  Pt is a 52 yo female with pmhx significant for lupus, htn, asthma, chf, hx vt (s/p AICD placement) dm, anemia, migraines, fibromyalgia, and RA.  Pt has been having sinus issues for a few months.  She's been on several abx.  She is scheduled to see ENT soon.  Pt woke up this am and felt very dizzy.  She felt drunk even though she does not drink.  She felt weak all over.  Pt has never had these sx in the past.         Home Medications Prior to Admission medications   Medication Sig Start Date End Date Taking? Authorizing Provider  albuterol (VENTOLIN HFA) 108 (90 Base) MCG/ACT inhaler Inhale 2 puffs into the lungs every 6 (six) hours as needed for wheezing or shortness of breath. 03/26/23   Viviano Simas, FNP  aspirin 81 MG chewable tablet Chew 162 mg by mouth daily. Taking 2 tablets    [provider]  atorvastatin (LIPITOR) 10 MG tablet TAKE 1 TABLET BY MOUTH ONCE  DAILY 03/11/23   Arnette Felts, FNP  carvedilol (COREG) 25 MG tablet TAKE 1 TABLET BY MOUTH IN THE  MORNING AND 1 TABLET BY MOUTH IN THE EVENING 01/01/23   Bensimhon, Bevelyn Buckles, MD  Continuous Blood Gluc Sensor (DEXCOM G7 SENSOR) MISC USE TO CHECK BLOOD GLUCOSE AS  DIRECTED 3 TIMES DAILY AND  CHANGE SENSOR EVERY 10 DAYS 03/11/23   Arnette Felts, FNP  diazepam (VALIUM) 5 MG tablet Take 1 tablet (5 mg total) by mouth 2 (two) times daily as needed for anxiety. 04/06/23   Jacalyn Lefevre, MD  diclofenac Sodium (VOLTAREN) 1 % GEL apply A SMALL AMOUNT TO involved joints UP TO TWICE DAILY 12/20/21   [provider]  ENTRESTO 97-103 MG TAKE 1 TABLET BY MOUTH TWICE  DAILY 01/01/23   Bensimhon, Bevelyn Buckles, MD  folic acid (FOLVITE) 1 MG tablet TAKE 1 TABLET BY MOUTH EVERY MORNING 01/30/17    Bensimhon, Bevelyn Buckles, MD  Glucagon (GVOKE HYPOPEN 2-PACK) 1 MG/0.2ML SOAJ Inject 1 mg into the skin as needed. 07/02/22   Arnette Felts, FNP  hydroxychloroquine (PLAQUENIL) 200 MG tablet Take 1 tablet (200 mg total) by mouth 2 (two) times daily. 02/02/19   Bensimhon, Bevelyn Buckles, MD  Insulin Pen Needle (NOVOFINE PLUS PEN NEEDLE) 32G X 4 MM MISC Use with insulin pens dx code e11.65 11/13/21   Arnette Felts, FNP  Ipratropium-Albuterol (COMBIVENT RESPIMAT) 20-100 MCG/ACT AERS respimat Inhale 1 puff into the lungs every 6 (six) hours. 09/30/22   Arnette Felts, FNP  ipratropium-albuterol (DUONEB) 0.5-2.5 (3) MG/3ML SOLN Take 3 mLs by nebulization every 6 (six) hours as needed. 10/11/21   Arnette Felts, FNP  Magnesium 400 MG TABS Take 1 tablet by mouth daily. 04/12/22   Arnette Felts, FNP  meclizine (ANTIVERT) 25 MG tablet Take 1 tablet (25 mg total) by mouth 3 (three) times daily as needed for dizziness. 04/06/23   Jacalyn Lefevre, MD  methotrexate (RHEUMATREX) 2.5 MG tablet Take 20 mg by mouth every Friday.  02/06/15   [provider]  metolazone (ZAROXOLYN) 2.5 MG tablet TAKE 1 TABLET BY MOUTH AS NEEDED FOR WEIGHT GAIN OF 5 POUNDS WITHIN 3 DAYS AS DIRECTED needs appt  for further refills 10/17/22   Bensimhon, Bevelyn Buckles, MD  Multiple Vitamin (MULTIVITAMIN WITH MINERALS) TABS Take 1 tablet by mouth every morning.     [provider]  OLUMIANT tablet Take 2 mg by mouth daily. 03/08/22   [provider]  ondansetron (ZOFRAN-ODT) 8 MG disintegrating tablet Take 1 tablet (8 mg total) by mouth every 8 (eight) hours as needed for nausea or vomiting. 12/24/21   Cathren Laine, MD  potassium chloride (KLOR-CON M) 10 MEQ tablet TAKE 3 TABLETS BY MOUTH TWICE  DAILY 10/30/22   Arnette Felts, FNP  predniSONE (DELTASONE) 5 MG tablet Take by mouth. 10/02/22   [provider]  PROAIR HFA 108 (90 Base) MCG/ACT inhaler INHALE 2 PUFFS BY MOUTH EVERY DAY AS NEEDED FOR SHORTNESS OF BREATH 09/30/22   Arnette Felts, FNP  spironolactone (ALDACTONE) 25 MG tablet TAKE 1 TABLET BY MOUTH IN THE  MORNING AND 1 TABLET BY MOUTH IN THE EVENING 01/01/23   Bensimhon, Bevelyn Buckles, MD  TRESIBA FLEXTOUCH 100 UNIT/ML FlexTouch Pen INJECT SUBCUTANEOUSLY INTO SKIN  10 UNITS DAILY 01/14/23   Arnette Felts, FNP  Vitamin D, Ergocalciferol, (DRISDOL) 1.25 MG (50000 UNIT) CAPS capsule Take 1 capsule (50,000 Units total) by mouth 2 (two) times a week. 09/30/22   Arnette Felts, FNP      Allergies    Hydrocodone-acetaminophen, Hydromorphone, Iodinated contrast media, Other, Erythromycin, Latex, Rinvoq [upadacitinib], Farxiga [dapagliflozin], Tape, and Mircette [desogestrel-ethinyl estradiol]    Review of Systems   Review of Systems  Neurological:  Positive for dizziness.  All other systems reviewed and are negative.   Physical Exam Updated Vital Signs BP 108/67   Pulse 65   Temp 98.3 F (36.8 C) (Oral)   Resp 14   Ht 5\' 6"  (1.676 m)   Wt 88.4 kg   SpO2 100%   BMI 31.46 kg/m  Physical Exam Vitals and nursing note reviewed.  Constitutional:      Appearance: Normal appearance. She is obese.  HENT:     Head: Normocephalic and atraumatic.     Right Ear: External ear normal.     Left Ear: External ear normal.     Nose: Nose normal.     Mouth/Throat:     Mouth: Mucous membranes are moist.     Pharynx: Oropharynx is clear.  Eyes:     Extraocular Movements: Extraocular movements intact.     Conjunctiva/sclera: Conjunctivae normal.     Pupils: Pupils are equal, round, and reactive to light.  Cardiovascular:     Rate and Rhythm: Normal rate and regular rhythm.     Pulses: Normal pulses.     Heart sounds: Normal heart sounds.  Pulmonary:     Effort: Pulmonary effort is normal.     Breath sounds: Normal breath sounds.  Abdominal:     General: Abdomen is flat. Bowel sounds are normal.     Palpations: Abdomen is soft.  Musculoskeletal:        General: Normal range of motion.     Cervical back: Normal range of  motion and neck supple.  Skin:    General: Skin is warm.     Capillary Refill: Capillary refill takes less than 2 seconds.  Neurological:     General: No focal deficit present.     Mental Status: She is alert and oriented to person, place, and time.     Comments: Dizziness with head movement  Psychiatric:        Mood and Affect: Mood  normal.        Behavior: Behavior normal.     ED Results / Procedures / Treatments   Labs (all labs ordered are listed, but only abnormal results are displayed) Labs Reviewed  CBC - Abnormal; Notable for the following components:      Result Value   WBC 11.5 (*)    RBC 3.32 (*)    Hemoglobin 9.7 (*)    HCT 30.7 (*)    RDW 15.9 (*)    All other components within normal limits  DIFFERENTIAL - Abnormal; Notable for the following components:   Neutro Abs 9.8 (*)    All other components within normal limits  COMPREHENSIVE METABOLIC PANEL - Abnormal; Notable for the following components:   Potassium 3.2 (*)    Glucose, Bld 102 (*)    Calcium 8.4 (*)    All other components within normal limits  URINALYSIS, ROUTINE W REFLEX MICROSCOPIC - Abnormal; Notable for the following components:   APPearance HAZY (*)    Glucose, UA >=500 (*)    All other components within normal limits  CBG MONITORING, ED - Abnormal; Notable for the following components:   Glucose-Capillary 270 (*)    All other components within normal limits  RESP PANEL BY RT-PCR (RSV, FLU A&B, COVID)  RVPGX2  PROTIME-INR  RAPID URINE DRUG SCREEN, HOSP PERFORMED  TROPONIN I (HIGH SENSITIVITY)  TROPONIN I (HIGH SENSITIVITY)    EKG EKG Interpretation  Date/Time:  Sunday April 06 2023 11:05:40 EDT Ventricular Rate:  69 PR Interval:  233 QRS Duration: 88 QT Interval:  378 QTC Calculation: 405 R Axis:   -29 Text Interpretation: Sinus rhythm Prolonged PR interval LVH with secondary repolarization abnormality No significant change since last tracing Confirmed by Jacalyn Lefevre [phone])  on 04/08/2023 12:08:04 PM  Radiology CUP [facility] Found [facility] DEVICE CHECK  Result Date: 04/07/2023 ICD check in clinic. Normal device function. Thresholds and sensing consistent with previous device measurements. Impedance trends stable over time. No mode switches. Known NSVT. Most recent episode 01/2023. Histogram distribution appropriate for patient and level of activity. No changes made this session. Device programmed at appropriate safety margins. Device programmed to optimize intrinsic conduction. Estimated longevity >11 years Pt enrolled in remote follow-up. Patient education completed including  shock plan. Auditory/vibratory alert demonstrated.   Procedures Procedures    Medications Ordered in ED Medications  sodium chloride 0.9 % bolus 500 mL (0 mLs Intravenous Stopped 04/06/23 1419)  meclizine (ANTIVERT) tablet 25 mg (25 mg Oral Given 04/06/23 1137)  diazepam (VALIUM) injection 2.5 mg (2.5 mg Intravenous Given 04/06/23 1137)    ED Course/ Medical Decision Making/ A&P                             Medical Decision Making Amount and/or Complexity of Data Reviewed Labs: ordered. Radiology: ordered.  Risk Prescription drug management.   This patient presents to the ED for concern of dizziness, this involves an extensive number of treatment options, and is a complaint that carries with it a high risk of complications and morbidity.  The differential diagnosis includes cva, vertigo, cardiac, electrolyte abn   Co morbidities that complicate the patient evaluation  lupus, htn, asthma, chf, hx vt (s/p AICD placement) dm, anemia, migraines, fibromyalgia, and RA   Additional history obtained:  Additional history obtained from epic chart review External records from outside source obtained and reviewed including EMS report   Lab Tests:  I Ordered, and  personally interpreted labs.  The pertinent results include:  cbc with hgb 9.7 (hgb 11.2 in Jan, but was in the upper 9s, low 10s in  2023), cmp with mild hypokalemia at 3.2, trop nl, covid/flu/rsv neg, uds neg   Imaging Studies ordered:  I ordered imaging studies including ct head and cxr  I independently visualized and interpreted imaging which showed  CT head:  No acute intracranial abnormality. No intracranial mass,  hemorrhage or edema.  2. LEFT maxillary sinus disease, incompletely imaged, of uncertain  age but favored to be chronic.  CXR: No active disease.  I agree with the radiologist interpretation   Cardiac Monitoring:  The patient was maintained on a cardiac monitor.  I personally viewed and interpreted the cardiac monitored which showed an underlying rhythm of: nsr   Medicines ordered and prescription drug management:  I ordered medication including valium and antivert  for sx  Reevaluation of the patient after these medicines showed that the patient improved I have reviewed the patients home medicines and have made adjustments as needed   Test Considered:  mri   Critical Interventions:  meds   Problem List / ED Course:  Dizziness:  sx gone after treatment.  She feels much better and is able to ambulate around the ED.  As sx have improved, it is unlikely to be a stroke.  It is likely BPV due to her sinus issues.  Pt d/c with Epley exercises.  Pt is stable for d/c.  Return if worse.     Reevaluation:  After the interventions noted above, I reevaluated the patient and found that they have :improved   Social Determinants of Health:  Lives at home   Dispostion:  After consideration of the diagnostic results and the patients response to treatment, I feel that the patent would benefit from discharge with outpatient f/u.          Final Clinical Impression(s) / ED Diagnoses Final diagnoses:  Vertigo    Rx / DC Orders ED Discharge Orders          Ordered    meclizine (ANTIVERT) 25 MG tablet  3 times daily PRN,   Status:  Discontinued        04/06/23 1421    diazepam  (VALIUM) 5 MG tablet  2 times daily PRN,   Status:  Discontinued        04/06/23 1421    diazepam (VALIUM) 5 MG tablet  2 times daily PRN        04/06/23 1429    meclizine (ANTIVERT) 25 MG tablet  3 times daily PRN        04/06/23 1429              Jacalyn Lefevre, MD 04/08/23 1210

## 2023-04-09 ENCOUNTER — Telehealth: Payer: Self-pay

## 2023-04-09 ENCOUNTER — Telehealth: Payer: Self-pay | Admitting: Internal Medicine

## 2023-04-09 NOTE — Telephone Encounter (Signed)
Patient called to reschedule appointment

## 2023-04-09 NOTE — Telephone Encounter (Signed)
        Patient  visited [place] on 4/14    Telephone encounter attempt :  2nd  A HIPAA compliant voice message was left requesting a return call.  Instructed patient to call back     Lenard Forth [facility] Guide, [facility] [phone] [address] [place], [place], [place] [address] Phone: [phone] Email: [email or web address]

## 2023-04-09 NOTE — Telephone Encounter (Signed)
        Patient  visited [place] on 4/14     Telephone encounter attempt :  1st  A HIPAA compliant voice message was left requesting a return call.  Instructed patient to call back     Lenard Forth [facility] Guide, [facility] [phone] [address] [place], [place], [place] [address] Phone: [phone] Email: [email or web address]

## 2023-04-10 ENCOUNTER — Inpatient Hospital Stay: Payer: 59

## 2023-04-10 ENCOUNTER — Inpatient Hospital Stay: Payer: 59 | Admitting: Internal Medicine

## 2023-04-10 ENCOUNTER — Other Ambulatory Visit: Payer: Medicaid Other

## 2023-04-10 ENCOUNTER — Ambulatory Visit: Payer: Medicaid Other | Admitting: Internal Medicine

## 2023-04-13 DIAGNOSIS — E1165 Type 2 diabetes mellitus with hyperglycemia: Secondary | ICD-10-CM | POA: Diagnosis not present

## 2023-04-15 ENCOUNTER — Ambulatory Visit: Payer: 59 | Admitting: Internal Medicine

## 2023-04-15 ENCOUNTER — Other Ambulatory Visit: Payer: 59

## 2023-04-15 MED ORDER — VEOZAH 45 MG PO TABS
1.0000 | ORAL_TABLET | Freq: Every day | ORAL | 3 refills | Status: DC
Start: 2023-04-15 — End: 2024-03-09

## 2023-04-22 ENCOUNTER — Encounter (HOSPITAL_BASED_OUTPATIENT_CLINIC_OR_DEPARTMENT_OTHER): Payer: Self-pay | Admitting: Pulmonary Disease

## 2023-04-22 ENCOUNTER — Encounter: Payer: Self-pay | Admitting: Nurse Practitioner

## 2023-04-22 DIAGNOSIS — R053 Chronic cough: Secondary | ICD-10-CM | POA: Diagnosis not present

## 2023-04-22 DIAGNOSIS — J320 Chronic maxillary sinusitis: Secondary | ICD-10-CM | POA: Diagnosis not present

## 2023-04-23 NOTE — Telephone Encounter (Signed)
Dr. Vassie Loll, please advise on pt's message regarding her recent ENT visit. Thanks.

## 2023-04-28 ENCOUNTER — Encounter (HOSPITAL_BASED_OUTPATIENT_CLINIC_OR_DEPARTMENT_OTHER): Payer: Self-pay

## 2023-04-28 ENCOUNTER — Ambulatory Visit (HOSPITAL_BASED_OUTPATIENT_CLINIC_OR_DEPARTMENT_OTHER): Payer: 59 | Attending: Internal Medicine | Admitting: Cardiology

## 2023-04-28 DIAGNOSIS — I110 Hypertensive heart disease with heart failure: Secondary | ICD-10-CM | POA: Diagnosis not present

## 2023-04-28 DIAGNOSIS — G4733 Obstructive sleep apnea (adult) (pediatric): Secondary | ICD-10-CM | POA: Diagnosis not present

## 2023-04-28 DIAGNOSIS — I5022 Chronic systolic (congestive) heart failure: Secondary | ICD-10-CM | POA: Diagnosis not present

## 2023-05-05 ENCOUNTER — Ambulatory Visit: Payer: Self-pay

## 2023-05-05 ENCOUNTER — Other Ambulatory Visit: Payer: Self-pay | Admitting: Nurse Practitioner

## 2023-05-05 DIAGNOSIS — J329 Chronic sinusitis, unspecified: Secondary | ICD-10-CM

## 2023-05-05 NOTE — Progress Notes (Signed)
This encounter was created in error - please disregard.

## 2023-05-05 NOTE — Progress Notes (Signed)
Referral to Dr. Leland Johns at patient request

## 2023-05-05 NOTE — Procedures (Signed)
Erroneous encounter

## 2023-05-06 NOTE — Patient Outreach (Signed)
  Care Coordination   Follow Up Visit Note   05/06/2023 Name: Christine Mcdowell MRN: [id number] DOB: 04-14-1971  Christine Mcdowell is a 52 y.o. year old female who sees Arnette Felts, FNP for primary care. I spoke with  [name] by phone today.  What matters to the patients health and wellness today?  Patient would like a second opinion for her chronic sinusitis.     Goals Addressed             This Visit's Progress    To better manage chronic sinusitis       Care Coordination Interventions: Evaluation of current treatment plan related to chronic sinusitis  and patient's adherence to plan as established by provider Determined patient completed a recent ENT follow up but was not satisfied with the treatment plan Determined patient completed the full course of prescribed antibiotics and steroids with good results but would like for PCP to send a new ENT referral due to her relief of symptoms are only temporary  Sent in basket message to PCP provider Arnette Felts FNP advising of patient's dissatisfaction with her ENT follow up Advised PCP patient would like a second opinion, she is requesting a referral be sent to Dr. Hipolito Bayley at [facility] in [facility]     To complete a sleep study for evaluation of OSA       Care Coordination Interventions: Evaluation of current treatment plan related to OSA  and patient's adherence to plan as established by provider Determined patient's sleep study was stopped a few hours into the study due to patient was experiencing post nasal cough that interfered with completing her sleep study Discussed with patient this test will be rescheduled at a later time Sent in basket message to PCP advising patient's sleep study will be rescheduled for a future date     Interventions Today    Flowsheet Row Most Recent Value  General Interventions   General Interventions Discussed/Reviewed General Interventions Discussed, General Interventions  Reviewed, Doctor Visits, Communication with  Doctor Visits Discussed/Reviewed Doctor Visits Discussed, Doctor Visits Reviewed, Specialist  Communication with PCP/Specialists  Arnette Felts FNP]  Education Interventions   Education Provided Provided Education  Provided Verbal Education On When to see the doctor, Medication  Pharmacy Interventions   Pharmacy Dicussed/Reviewed Medications and their functions, Pharmacy Topics Discussed, Pharmacy Topics Reviewed          SDOH assessments and interventions completed:  No     Care Coordination Interventions:  Yes, provided   Follow up plan: Follow up call scheduled for 05/16/23 @2 :00 PM    Encounter Outcome:  Pt. Visit Completed

## 2023-05-06 NOTE — Patient Instructions (Signed)
Visit Information  Thank you for taking time to visit with me today. Please don't hesitate to contact me if I can be of assistance to you.   Following are the goals we discussed today:   Goals Addressed             This Visit's Progress    To better manage chronic sinusitis       Care Coordination Interventions: Evaluation of current treatment plan related to chronic sinusitis  and patient's adherence to plan as established by provider Determined patient completed a recent ENT follow up but was not satisfied with the treatment plan Determined patient completed the full course of prescribed antibiotics and steroids with good results but would like for PCP to send a new ENT referral due to her relief of symptoms are only temporary  Sent in basket message to PCP provider Arnette Felts FNP advising of patient's dissatisfaction with her ENT follow up Advised PCP patient would like a second opinion, she is requesting a referral be sent to Dr. Hipolito Bayley at [facility] in [facility]     To complete a sleep study for evaluation of OSA       Care Coordination Interventions: Evaluation of current treatment plan related to OSA  and patient's adherence to plan as established by provider Determined patient's sleep study was stopped a few hours into the study due to patient was experiencing post nasal cough that interfered with completing her sleep study Discussed with patient this test will be rescheduled at a later time Sent in basket message to PCP advising patient's sleep study will be rescheduled for a future date         Our next appointment is by telephone on 05/16/23 at 2:00 PM  Please call the care guide team at [phone] if you need to cancel or reschedule your appointment.   If you are experiencing a Mental Health or Behavioral Health Crisis or need someone to talk to, please call 1-800-273-TALK (toll free, 24 hour hotline) go to [facility] [address], [place] [phone])  Patient verbalizes understanding of instructions and care plan provided today and agrees to view in MyChart. Active MyChart status and patient understanding of how to access instructions and care plan via MyChart confirmed with patient.     [name], RN, BSN, CCM Care Management Coordinator [facility] Care Management  Direct Phone: [phone]

## 2023-05-08 ENCOUNTER — Telehealth: Payer: Self-pay | Admitting: Internal Medicine

## 2023-05-08 NOTE — Telephone Encounter (Signed)
Rescheduled 05/20 appointment due to scheduling conflict. Patient has been called and voicemail was left.

## 2023-05-12 ENCOUNTER — Inpatient Hospital Stay: Payer: 59

## 2023-05-12 ENCOUNTER — Inpatient Hospital Stay: Payer: 59 | Admitting: Internal Medicine

## 2023-05-13 DIAGNOSIS — E1165 Type 2 diabetes mellitus with hyperglycemia: Secondary | ICD-10-CM | POA: Diagnosis not present

## 2023-05-16 ENCOUNTER — Ambulatory Visit: Payer: Self-pay

## 2023-05-16 NOTE — Patient Outreach (Signed)
  Care Coordination   Follow Up Visit Note   05/16/2023 Name: Brad Falardeau MRN: [id number] DOB: 1971/10/29  Alycea Rudzik is a 52 y.o. year old female who sees Arnette Felts, FNP for primary care. I spoke with  [name] by phone today.  What matters to the patients health and wellness today?  Patient would like to get a second opinion for her chronic sinusitis.     Goals Addressed             This Visit's Progress    To better manage chronic sinusitis       Care Coordination Interventions: Evaluation of current treatment plan related to chronic sinusitis  and patient's adherence to plan as established by provider Determined patient has an initial visit with Dr. Hipolito Bayley, ENT scheduled for 07/07/23 Discussed she continues to have symptomatic sinusitis without relief from the prescribed remedies  Provided patient the contact number for ENT office and encouraged patient to call daily to ask for a cancellation  Discussed patient continues to take Methotrexate as prescribed for RA and Lupus Encouraged patient to ask her doctor about an alternative medication due to effects this medication has on her immune system Instructed patient to keep her PCP informed of new or worsening symptoms promptly Reviewed next upcoming scheduled PCP visit with Arnette Felts FNP scheduled for 05/26/23 @11 :20AM    Interventions Today    Flowsheet Row Most Recent Value  Chronic Disease   Chronic disease during today's visit Other  [chronic sinusitis]  General Interventions   General Interventions Discussed/Reviewed General Interventions Discussed, General Interventions Reviewed, Doctor Visits  Doctor Visits Discussed/Reviewed Doctor Visits Discussed, Doctor Visits Reviewed, Specialist  Education Interventions   Education Provided Provided Education  Provided Verbal Education On When to see the doctor          SDOH assessments and interventions completed:  No     Care  Coordination Interventions:  Yes, provided   Follow up plan: Follow up call scheduled for 07/16/23 @11 :00 AM    Encounter Outcome:  Pt. Visit Completed

## 2023-05-16 NOTE — Patient Instructions (Signed)
Visit Information  Thank you for taking time to visit with me today. Please don't hesitate to contact me if I can be of assistance to you.   Following are the goals we discussed today:   Goals Addressed             This Visit's Progress    To better manage chronic sinusitis       Care Coordination Interventions: Evaluation of current treatment plan related to chronic sinusitis  and patient's adherence to plan as established by provider Determined patient has an initial visit with Dr. Hipolito Bayley, ENT scheduled for 07/07/23 Discussed she continues to have symptomatic sinusitis without relief from the prescribed remedies  Provided patient the contact number for ENT office and encouraged patient to call daily to ask for a cancellation  Discussed patient continues to take Methotrexate as prescribed for RA and Lupus Encouraged patient to ask her doctor about an alternative medication due to effects this medication has on her immune system Instructed patient to keep her PCP informed of new or worsening symptoms promptly Reviewed next upcoming scheduled PCP visit with Arnette Felts FNP scheduled for 05/26/23 @11 :20AM        Our next appointment is by telephone on 07/16/23 at 11:00 AM  Please call the care guide team at [phone] if you need to cancel or reschedule your appointment.   If you are experiencing a Mental Health or Behavioral Health Crisis or need someone to talk to, please call 1-800-273-TALK (toll free, 24 hour hotline) go to [facility] [address] [place] [phone])  Patient verbalizes understanding of instructions and care plan provided today and agrees to view in MyChart. Active MyChart status and patient understanding of how to access instructions and care plan via MyChart confirmed with patient.     [name], RN, BSN, CCM Care Management Coordinator [facility] Direct Phone: [phone]

## 2023-05-26 ENCOUNTER — Ambulatory Visit (INDEPENDENT_AMBULATORY_CARE_PROVIDER_SITE_OTHER): Payer: 59 | Admitting: Nurse Practitioner

## 2023-05-26 VITALS — BP 120/70 | HR 62 | Temp 98.1°F | Ht 66.0 in | Wt 190.2 lb

## 2023-05-26 DIAGNOSIS — E559 Vitamin D deficiency, unspecified: Secondary | ICD-10-CM

## 2023-05-26 DIAGNOSIS — E785 Hyperlipidemia, unspecified: Secondary | ICD-10-CM

## 2023-05-26 DIAGNOSIS — E1169 Type 2 diabetes mellitus with other specified complication: Secondary | ICD-10-CM

## 2023-05-26 DIAGNOSIS — E7800 Pure hypercholesterolemia, unspecified: Secondary | ICD-10-CM

## 2023-05-26 DIAGNOSIS — R0981 Nasal congestion: Secondary | ICD-10-CM | POA: Diagnosis not present

## 2023-05-26 DIAGNOSIS — J322 Chronic ethmoidal sinusitis: Secondary | ICD-10-CM | POA: Diagnosis not present

## 2023-05-26 DIAGNOSIS — E6609 Other obesity due to excess calories: Secondary | ICD-10-CM

## 2023-05-26 DIAGNOSIS — Z6830 Body mass index (BMI) 30.0-30.9, adult: Secondary | ICD-10-CM

## 2023-05-26 DIAGNOSIS — I509 Heart failure, unspecified: Secondary | ICD-10-CM

## 2023-05-26 DIAGNOSIS — E1165 Type 2 diabetes mellitus with hyperglycemia: Secondary | ICD-10-CM

## 2023-05-26 DIAGNOSIS — I110 Hypertensive heart disease with heart failure: Secondary | ICD-10-CM

## 2023-05-26 MED ORDER — TRESIBA FLEXTOUCH 100 UNIT/ML ~~LOC~~ SOPN
PEN_INJECTOR | SUBCUTANEOUS | 2 refills | Status: DC
Start: 2023-05-26 — End: 2024-06-01

## 2023-05-26 MED ORDER — XHANCE 93 MCG/ACT NA EXHU
1.0000 | INHALANT_SUSPENSION | Freq: Two times a day (BID) | NASAL | 2 refills | Status: AC
Start: 2023-05-26 — End: ?

## 2023-05-26 NOTE — Patient Instructions (Signed)

## 2023-05-26 NOTE — Progress Notes (Addendum)
Christine Mcdowell,acting as a Neurosurgeon for Arnette Felts, FNP.,have documented all relevant documentation on the behalf of Arnette Felts, FNP,as directed by  Arnette Felts, FNP while in the presence of Arnette Felts, FNP.    Subjective:     Patient ID: Christine Mcdowell , female    DOB: December 25, 1970 , 52 y.o.   MRN: [id number]   Chief Complaint  Patient presents with   Diabetes    HPI  Patient presents today for bp,dm, and chol check.  She has seen Dr. Milas Kocher in February EF 45-50%. Patient reports compliance with medications and has no other concerns today. Patient reports she is still having issues with her sinus. She has an appt set with Dr. Leland Johns this month.   BP Readings from Last 3 Encounters: 05/26/23 : 120/70 04/07/23 : 110/65 04/06/23 : 108/67    Diabetes She presents for her follow-up diabetic visit. She has type 2 diabetes mellitus. Pertinent negatives for hypoglycemia include no confusion or nervousness/anxiousness. There are no diabetic associated symptoms. Pertinent negatives for diabetes include no polydipsia, no polyphagia and no polyuria. (She has drank juice to help increase her blood sugars. ) Diabetic complications include heart disease and peripheral neuropathy. Risk factors for coronary artery disease include sedentary lifestyle and obesity. Current diabetic treatment includes oral agent (dual therapy) (tresiba (10 units nightly) and metformin - not having spikes - she is not needing the humalog at this time). She is compliant with treatment all of the time. Her weight is stable. She is following a generally healthy diet. When asked about meal planning, she reported none. She has not had a previous visit with a dietitian. She rarely participates in exercise. (Blood sugar is ranging 90-103 in am, continues on prednisone 5 mg a day) An ACE inhibitor/angiotensin II receptor blocker is being taken. She does not see a podiatrist.Eye exam is not current.  Hypertension This is a  chronic problem. The current episode started more than 1 year ago. The problem is controlled. Pertinent negatives include no anxiety. Risk factors for coronary artery disease include obesity and sedentary lifestyle. Past treatments include diuretics and calcium channel blockers.     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   Discoid lupus    Fibromyalgia    History of blood transfusion "several"   "related to anemia; had some w/hysterectomy also"   Hx of cardiovascular stress test    ETT-Myoview (9/15):  No ischemia, EF 52%; NORMAL   Hx of echocardiogram    Echo (9/15):  EF 50-55%, ant HK, Gr 1 DD, mild MR, mild LAE, no effusion   Hypertension    Iron deficiency anemia    h/o iron transfusions   Lupus (systemic lupus erythematosus) (HCC)    Migraine    "a few/year" (07/03/2016)   Pneumonia 12/2015   RA (rheumatoid arthritis) (HCC)    "all over" (07/03/2016)   Sickle cell trait (HCC)    Stroke (HCC) 2014 X 1; 2015 X 2; 2016 X 1;    "right side of face more relaxed than the other; rare speech hesitation" (07/03/2016)   Vaginal Pap smear, abnormal    ASCUS; HPV     Family History  Problem Relation Age of  Onset   Arthritis Mother    Heart murmur Mother    Drug abuse Mother    Allergies Mother    Heart attack Father    Cushing syndrome Father    Depression Father    Allergies Father    Dementia Paternal Grandmother    Cancer Paternal Grandfather    Diabetes Maternal Grandmother    Hypertension Maternal Grandmother    Asthma Maternal Grandmother    Heart attack Maternal Grandfather    Breast cancer Neg Hx      Current Outpatient Medications:    albuterol (VENTOLIN HFA) 108 (90 Base) MCG/ACT inhaler, Inhale 2 puffs into  the lungs every 6 (six) hours as needed for wheezing or shortness of breath., Disp: 8 g, Rfl: 0   aspirin 81 MG chewable tablet, Chew 162 mg by mouth daily. Taking 2 tablets, Disp: , Rfl:    atorvastatin (LIPITOR) 10 MG tablet, TAKE 1 TABLET BY MOUTH ONCE  DAILY, Disp: 100 tablet, Rfl: 2   carvedilol (COREG) 25 MG tablet, TAKE 1 TABLET BY MOUTH IN THE  MORNING AND 1 TABLET BY MOUTH IN THE EVENING, Disp: 60 tablet, Rfl: 11   Continuous Blood Gluc Sensor (DEXCOM G7 SENSOR) MISC, USE TO CHECK BLOOD GLUCOSE AS  DIRECTED 3 TIMES DAILY AND  CHANGE SENSOR EVERY 10 DAYS, Disp: 10 each, Rfl: 2   diazepam (VALIUM) 5 MG tablet, Take 1 tablet (5 mg total) by mouth 2 (two) times daily as needed for anxiety., Disp: 10 tablet, Rfl: 0   diclofenac Sodium (VOLTAREN) 1 % GEL, apply A SMALL AMOUNT TO involved joints UP TO TWICE DAILY, Disp: , Rfl:    ENTRESTO 97-103 MG, TAKE 1 TABLET BY MOUTH TWICE  DAILY, Disp: 60 tablet, Rfl: 11   Fezolinetant (VEOZAH) 45 MG TABS, Take 1 tablet (45 mg total) by mouth daily., Disp: 30 tablet, Rfl: 3   Fluticasone Propionate (XHANCE) 93 MCG/ACT EXHU, Place 1 spray into the nose in the morning and at bedtime., Disp: 16 mL, Rfl: 2   folic acid (FOLVITE) 1 MG tablet, TAKE 1 TABLET BY MOUTH EVERY MORNING, Disp: 30 tablet, Rfl: 0   Glucagon (GVOKE HYPOPEN 2-PACK) 1 MG/0.2ML SOAJ, Inject 1 mg into the skin as needed., Disp: 0.2 mL, Rfl: 5   hydroxychloroquine (PLAQUENIL) 200 MG tablet, Take 1 tablet (200 mg total) by mouth 2 (two) times daily., Disp: 180 tablet, Rfl: 0   Insulin Pen Needle (NOVOFINE PLUS PEN NEEDLE) 32G X 4 MM MISC, Use with insulin pens dx code e11.65, Disp: 300 each, Rfl: 3   Ipratropium-Albuterol (COMBIVENT RESPIMAT) 20-100 MCG/ACT AERS respimat, Inhale 1 puff into the lungs every 6 (six) hours., Disp: 4 g, Rfl: 5   ipratropium-albuterol (DUONEB) 0.5-2.5 (3) MG/3ML SOLN, Take 3 mLs by nebulization every 6 (six) hours as needed., Disp: 360 mL, Rfl: 2   Magnesium 400 MG  TABS, Take 1 tablet by mouth daily., Disp: 90 tablet, Rfl: 1   meclizine (ANTIVERT) 25 MG tablet, Take 1 tablet (25 mg total) by mouth 3 (three) times daily as needed for dizziness., Disp: 30 tablet, Rfl: 0   methotrexate (RHEUMATREX) 2.5 MG tablet, Take 20 mg by mouth every Friday. , Disp: , Rfl: 3   metolazone (ZAROXOLYN) 2.5 MG tablet, TAKE 1 TABLET BY MOUTH AS NEEDED FOR WEIGHT GAIN OF 5 POUNDS WITHIN 3 DAYS AS DIRECTED needs appt for further refills, Disp: 5 tablet, Rfl: 0   Multiple Vitamin (MULTIVITAMIN WITH MINERALS) TABS, Take 1 tablet by  mouth every morning. , Disp: , Rfl:    OLUMIANT tablet, Take 2 mg by mouth daily., Disp: , Rfl:    ondansetron (ZOFRAN-ODT) 8 MG disintegrating tablet, Take 1 tablet (8 mg total) by mouth every 8 (eight) hours as needed for nausea or vomiting., Disp: 10 tablet, Rfl: 0   potassium chloride (KLOR-CON M) 10 MEQ tablet, TAKE 3 TABLETS BY MOUTH TWICE  DAILY, Disp: 360 tablet, Rfl: 5   predniSONE (DELTASONE) 5 MG tablet, Take by mouth., Disp: , Rfl:    PROAIR HFA 108 (90 Base) MCG/ACT inhaler, INHALE 2 PUFFS BY MOUTH EVERY DAY AS NEEDED FOR SHORTNESS OF BREATH, Disp: 18 g, Rfl: 2   spironolactone (ALDACTONE) 25 MG tablet, TAKE 1 TABLET BY MOUTH IN THE  MORNING AND 1 TABLET BY MOUTH IN THE EVENING, Disp: 60 tablet, Rfl: 11   Vitamin D, Ergocalciferol, (DRISDOL) 1.25 MG (50000 UNIT) CAPS capsule, Take 1 capsule (50,000 Units total) by mouth 2 (two) times a week., Disp: 12 capsule, Rfl: 3   insulin degludec (TRESIBA FLEXTOUCH) 100 UNIT/ML FlexTouch Pen, INJECT SUBCUTANEOUSLY INTO SKIN  10 UNITS DAILY, Disp: 15 mL, Rfl: 2   Allergies  Allergen Reactions   Hydrocodone-Acetaminophen Nausea And Vomiting   Hydromorphone Nausea And Vomiting    Other reaction(s): GI Upset (intolerance), Hypertension (intolerance) Raises blood pressure  Other reaction(s): GI Upset (intolerance), Hypertension (intolerance) Raises blood pressure to stroke level   Iodinated Contrast  Media Other (See Comments)    Shuts down kidneys Shuts kidney function down   Other Other (See Comments) and Anaphylaxis    Spicy foods and seasonings Skin Prep "makes my skin peel off" Paper tape causes skin burns   Erythromycin Nausea And Vomiting   Latex Hives   Rinvoq [Upadacitinib] Other (See Comments)   Farxiga [Dapagliflozin] Other (See Comments)   Tape Other (See Comments)    "Skin burns"   Mircette [Desogestrel-Ethinyl Estradiol] Nausea And Vomiting and Rash     Review of Systems  Constitutional: Negative.   HENT:  Positive for sinus pressure and sinus pain.   Respiratory:  Positive for cough. Negative for wheezing.   Cardiovascular: Negative.   Endocrine: Negative for polydipsia, polyphagia and polyuria.  Neurological: Negative.   Psychiatric/Behavioral:  Negative for confusion. The patient is not nervous/anxious.      Today's Vitals   05/26/23 1124  BP: 120/70  Pulse: 62  Temp: 98.1 F (36.7 C)  TempSrc: Oral  Weight: 190 lb 3.2 oz (86.3 kg)  Height: 5\' 6"  (1.676 m)  PainSc: 8   PainLoc: Shoulder   Body mass index is 30.7 kg/m.  Wt Readings from Last 3 Encounters:  05/26/23 190 lb 3.2 oz (86.3 kg)  04/06/23 194 lb 14.2 oz (88.4 kg)  02/21/23 194 lb 12.8 oz (88.4 kg)    The ASCVD Risk score (Arnett DK, et al., 2019) failed to calculate for the following reasons:   The patient has a prior MI or stroke diagnosis  Objective:  Physical Exam Vitals reviewed.  Constitutional:      General: She is not in acute distress.    Appearance: Normal appearance. She is well-developed. She is obese.  HENT:     Head: Normocephalic and atraumatic.     Right Ear: Hearing, tympanic membrane, ear canal and external ear normal. There is no impacted cerumen.     Left Ear: Hearing, tympanic membrane, ear canal and external ear normal. There is no impacted cerumen.     Nose: No congestion or  rhinorrhea.     Right Sinus: No maxillary sinus tenderness or frontal sinus  tenderness.     Left Sinus: No maxillary sinus tenderness or frontal sinus tenderness.     Mouth/Throat:     Mouth: Mucous membranes are moist.  Eyes:     General: Lids are normal.     Extraocular Movements: Extraocular movements intact.     Conjunctiva/sclera: Conjunctivae normal.     Pupils: Pupils are equal, round, and reactive to light.     Funduscopic exam:    Right eye: No papilledema.        Left eye: No papilledema.  Neck:     Thyroid: No thyroid mass.     Vascular: No carotid bruit.  Cardiovascular:     Rate and Rhythm: Normal rate and regular rhythm.     Pulses: Normal pulses.     Heart sounds: Normal heart sounds. No murmur heard. Pulmonary:     Effort: Pulmonary effort is normal. No respiratory distress.     Breath sounds: Normal breath sounds. No wheezing.  Musculoskeletal:     Cervical back: Full passive range of motion without pain, normal range of motion and neck supple.  Skin:    General: Skin is warm and dry.     Capillary Refill: Capillary refill takes less than 2 seconds.  Neurological:     General: No focal deficit present.     Mental Status: She is alert and oriented to person, place, and time.     Cranial Nerves: No cranial nerve deficit.     Sensory: No sensory deficit.     Motor: No weakness.  Psychiatric:        Mood and Affect: Mood normal.        Behavior: Behavior normal.        Thought Content: Thought content normal.        Judgment: Judgment normal.         Assessment And Plan:     1. Type 2 diabetes mellitus with hyperlipidemia (HCC) Comments: HgbA1c has been slightly elevated, continue current medications. - Basic metabolic panel - Hemoglobin A1c - Lipid panel - insulin degludec (TRESIBA FLEXTOUCH) 100 UNIT/ML FlexTouch Pen; INJECT SUBCUTANEOUSLY INTO SKIN  10 UNITS DAILY  Dispense: 15 mL; Refill: 2  2. Benign hypertension with coincident congestive heart failure (HCC) Comments: Blood pressue is well controlled, continue f/u with  Cardiology - Basic metabolic panel  3. Vitamin D deficiency Comments: Will check vitamin d level. Continue current medications - VITAMIN D 25 Hydroxy (Vit-D Deficiency, Fractures)  4. Sinus congestion Comments: Will see if we can get her approved for [facility] until she sees the new ENT. - Fluticasone Propionate (XHANCE) 93 MCG/ACT EXHU; Place 1 spray into the nose in the morning and at bedtime.  Dispense: 16 mL; Refill: 2  5. Chronic ethmoidal sinusitis Comments: She has an upcoming appt with Dr. Leland Johns. This has been ongoing and seen by another ENT but was not happy - Fluticasone Propionate (XHANCE) 93 MCG/ACT EXHU; Place 1 spray into the nose in the morning and at bedtime.  Dispense: 16 mL; Refill: 2  6. Class 1 obesity due to excess calories with body mass index (BMI) of 30.0 to 30.9 in adult, unspecified whether serious comorbidity present She is encouraged to strive for BMI less than 30 to decrease cardiac risk. Advised to aim for at least 150 minutes of exercise per week.   Return for controlled DM check 4 months.  Patient was given  opportunity to ask questions. Patient verbalized understanding of the plan and was able to repeat key elements of the plan. All questions were answered to their satisfaction.  Arnette Felts, FNP   I, Arnette Felts, FNP, have reviewed all documentation for this visit. The documentation on 05/26/23 for the exam, diagnosis, procedures, and orders are all accurate and complete.   IF YOU HAVE BEEN REFERRED TO A SPECIALIST, IT MAY TAKE 1-2 WEEKS TO SCHEDULE/PROCESS THE REFERRAL. IF YOU HAVE NOT HEARD FROM US/SPECIALIST IN TWO WEEKS, PLEASE GIVE [place] A CALL AT [phone] X [phone].   THE PATIENT IS ENCOURAGED TO PRACTICE SOCIAL DISTANCING DUE TO THE COVID-19 PANDEMIC.

## 2023-05-27 ENCOUNTER — Encounter: Payer: Self-pay | Admitting: Nurse Practitioner

## 2023-05-27 LAB — LIPID PANEL
Chol/HDL Ratio: 3.5 ratio (ref 0.0–4.4)
Cholesterol, Total: 174 mg/dL (ref 100–199)
HDL: 50 mg/dL (ref >39)
LDL Chol Calc (NIH): 107 mg/dL — ABNORMAL HIGH (ref 0–99)
Triglycerides: 94 mg/dL (ref 0–149)
VLDL Cholesterol Cal: 17 mg/dL (ref 5–40)

## 2023-05-27 LAB — BASIC METABOLIC PANEL
BUN/Creatinine Ratio: 13 (ref 9–23)
BUN: 11 mg/dL (ref 6–24)
CO2: 25 mmol/L (ref 20–29)
Calcium: 9.2 mg/dL (ref 8.7–10.2)
Chloride: 105 mmol/L (ref 96–106)
Creatinine, Ser: 0.86 mg/dL (ref 0.57–1.00)
Glucose: 84 mg/dL (ref 70–99)
Potassium: 3.7 mmol/L (ref 3.5–5.2)
Sodium: 144 mmol/L (ref 134–144)
eGFR: 80 mL/min/{1.73_m2} (ref >59)

## 2023-05-27 LAB — VITAMIN D 25 HYDROXY (VIT D DEFICIENCY, FRACTURES): Vit D, 25-Hydroxy: 66.2 ng/mL (ref 30.0–100.0)

## 2023-05-27 LAB — HEMOGLOBIN A1C
Est. average glucose Bld gHb Est-mCnc: 128 mg/dL
Hgb A1c MFr Bld: 6.1 % — ABNORMAL HIGH (ref 4.8–5.6)

## 2023-05-28 ENCOUNTER — Inpatient Hospital Stay: Payer: 59 | Admitting: Internal Medicine

## 2023-05-28 ENCOUNTER — Inpatient Hospital Stay: Payer: 59 | Attending: Internal Medicine

## 2023-05-28 ENCOUNTER — Ambulatory Visit (INDEPENDENT_AMBULATORY_CARE_PROVIDER_SITE_OTHER): Payer: 59

## 2023-05-28 DIAGNOSIS — I420 Dilated cardiomyopathy: Secondary | ICD-10-CM

## 2023-05-28 LAB — CUP PACEART REMOTE DEVICE CHECK
Battery Remaining Longevity: 126 mo
Battery Remaining Percentage: 88 %
Brady Statistic RV Percent Paced: 0 %
Date Time Interrogation Session: 20240605053000
HighPow Impedance: 72 Ohm
Implantable Lead Connection Status: 753985
Implantable Lead Implant Date: 20190206
Implantable Lead Location: 753860
Implantable Lead Model: 292
Implantable Lead Serial Number: 438194
Implantable Pulse Generator Implant Date: 20190206
Lead Channel Impedance Value: 536 Ohm
Lead Channel Setting Pacing Amplitude: 2.5 V
Lead Channel Setting Pacing Pulse Width: 0.4 ms
Lead Channel Setting Sensing Sensitivity: 0.5 mV
Pulse Gen Serial Number: 243572
Zone Setting Status: 755011

## 2023-06-12 DIAGNOSIS — E1165 Type 2 diabetes mellitus with hyperglycemia: Secondary | ICD-10-CM | POA: Diagnosis not present

## 2023-06-16 DIAGNOSIS — E559 Vitamin D deficiency, unspecified: Secondary | ICD-10-CM | POA: Diagnosis not present

## 2023-06-16 DIAGNOSIS — G8929 Other chronic pain: Secondary | ICD-10-CM | POA: Diagnosis not present

## 2023-06-16 DIAGNOSIS — Z79899 Other long term (current) drug therapy: Secondary | ICD-10-CM | POA: Diagnosis not present

## 2023-06-16 DIAGNOSIS — M329 Systemic lupus erythematosus, unspecified: Secondary | ICD-10-CM | POA: Diagnosis not present

## 2023-06-16 DIAGNOSIS — M545 Low back pain, unspecified: Secondary | ICD-10-CM | POA: Diagnosis not present

## 2023-06-16 DIAGNOSIS — L659 Nonscarring hair loss, unspecified: Secondary | ICD-10-CM | POA: Diagnosis not present

## 2023-06-16 DIAGNOSIS — E538 Deficiency of other specified B group vitamins: Secondary | ICD-10-CM | POA: Diagnosis not present

## 2023-06-16 DIAGNOSIS — M0579 Rheumatoid arthritis with rheumatoid factor of multiple sites without organ or systems involvement: Secondary | ICD-10-CM | POA: Diagnosis not present

## 2023-06-22 ENCOUNTER — Other Ambulatory Visit: Payer: Self-pay

## 2023-06-22 ENCOUNTER — Emergency Department (HOSPITAL_COMMUNITY): Payer: 59

## 2023-06-22 ENCOUNTER — Emergency Department (HOSPITAL_COMMUNITY)
Admission: EM | Admit: 2023-06-22 | Discharge: 2023-06-22 | Disposition: A | Discharge status: Home / Self Care | Payer: 59 | Attending: Emergency Medicine | Admitting: Emergency Medicine

## 2023-06-22 DIAGNOSIS — I509 Heart failure, unspecified: Secondary | ICD-10-CM | POA: Diagnosis not present

## 2023-06-22 DIAGNOSIS — R1084 Generalized abdominal pain: Secondary | ICD-10-CM

## 2023-06-22 DIAGNOSIS — R0902 Hypoxemia: Secondary | ICD-10-CM | POA: Diagnosis not present

## 2023-06-22 DIAGNOSIS — R197 Diarrhea, unspecified: Secondary | ICD-10-CM | POA: Insufficient documentation

## 2023-06-22 DIAGNOSIS — I110 Hypertensive heart disease with heart failure: Secondary | ICD-10-CM | POA: Insufficient documentation

## 2023-06-22 DIAGNOSIS — R112 Nausea with vomiting, unspecified: Secondary | ICD-10-CM | POA: Diagnosis not present

## 2023-06-22 DIAGNOSIS — R6889 Other general symptoms and signs: Secondary | ICD-10-CM | POA: Diagnosis not present

## 2023-06-22 DIAGNOSIS — J45909 Unspecified asthma, uncomplicated: Secondary | ICD-10-CM | POA: Insufficient documentation

## 2023-06-22 DIAGNOSIS — R1030 Lower abdominal pain, unspecified: Secondary | ICD-10-CM | POA: Diagnosis not present

## 2023-06-22 DIAGNOSIS — R110 Nausea: Secondary | ICD-10-CM | POA: Diagnosis not present

## 2023-06-22 DIAGNOSIS — Z8541 Personal history of malignant neoplasm of cervix uteri: Secondary | ICD-10-CM | POA: Diagnosis not present

## 2023-06-22 DIAGNOSIS — E119 Type 2 diabetes mellitus without complications: Secondary | ICD-10-CM | POA: Diagnosis not present

## 2023-06-22 DIAGNOSIS — Z743 Need for continuous supervision: Secondary | ICD-10-CM | POA: Diagnosis not present

## 2023-06-22 DIAGNOSIS — K529 Noninfective gastroenteritis and colitis, unspecified: Secondary | ICD-10-CM

## 2023-06-22 DIAGNOSIS — R109 Unspecified abdominal pain: Secondary | ICD-10-CM | POA: Diagnosis not present

## 2023-06-22 DIAGNOSIS — Z79899 Other long term (current) drug therapy: Secondary | ICD-10-CM | POA: Diagnosis not present

## 2023-06-22 LAB — CBC
HCT: 40.5 % (ref 36.0–46.0)
Hemoglobin: 13 g/dL (ref 12.0–15.0)
MCH: 28.3 pg (ref 26.0–34.0)
MCHC: 32.1 g/dL (ref 30.0–36.0)
MCV: 88.2 fL (ref 80.0–100.0)
Platelets: 191 10*3/uL (ref 150–400)
RBC: 4.59 MIL/uL (ref 3.87–5.11)
RDW: 13.3 % (ref 11.5–15.5)
WBC: 5.2 10*3/uL (ref 4.0–10.5)
nRBC: 0 % (ref 0.0–0.2)

## 2023-06-22 LAB — COMPREHENSIVE METABOLIC PANEL
ALT: 20 U/L (ref 0–44)
AST: 20 U/L (ref 15–41)
Albumin: 4.9 g/dL (ref 3.5–5.0)
Alkaline Phosphatase: 56 U/L (ref 38–126)
Anion gap: 11 (ref 5–15)
BUN: 29 mg/dL — ABNORMAL HIGH (ref 6–20)
CO2: 24 mmol/L (ref 22–32)
Calcium: 9.4 mg/dL (ref 8.9–10.3)
Chloride: 104 mmol/L (ref 98–111)
Creatinine, Ser: 1.13 mg/dL — ABNORMAL HIGH (ref 0.44–1.00)
GFR, Estimated: 58 mL/min — ABNORMAL LOW (ref >60)
Glucose, Bld: 98 mg/dL (ref 70–99)
Potassium: 2.8 mmol/L — ABNORMAL LOW (ref 3.5–5.1)
Sodium: 139 mmol/L (ref 135–145)
Total Bilirubin: 1.1 mg/dL (ref 0.3–1.2)
Total Protein: 8.8 g/dL — ABNORMAL HIGH (ref 6.5–8.1)

## 2023-06-22 LAB — LIPASE, BLOOD: Lipase: 50 U/L (ref 11–51)

## 2023-06-22 MED ORDER — KETOROLAC TROMETHAMINE 15 MG/ML IJ SOLN
15.0000 mg | Freq: Once | INTRAMUSCULAR | Status: AC
  Administered 2023-06-22: 15 mg via INTRAVENOUS
  Filled 2023-06-22: qty 1

## 2023-06-22 MED ORDER — SODIUM CHLORIDE 0.9 % IV BOLUS
1000.0000 mL | Freq: Once | INTRAVENOUS | Status: AC
  Administered 2023-06-22: 1000 mL via INTRAVENOUS

## 2023-06-22 MED ORDER — DIPHENHYDRAMINE HCL 50 MG/ML IJ SOLN
25.0000 mg | Freq: Once | INTRAMUSCULAR | Status: AC
  Administered 2023-06-22: 25 mg via INTRAVENOUS
  Filled 2023-06-22: qty 1

## 2023-06-22 MED ORDER — ONDANSETRON 4 MG PO TBDP: ORAL_TABLET | ORAL | 0 refills | Status: AC

## 2023-06-22 MED ORDER — POTASSIUM CHLORIDE CRYS ER 20 MEQ PO TBCR
40.0000 meq | EXTENDED_RELEASE_TABLET | Freq: Once | ORAL | Status: AC
  Administered 2023-06-22: 40 meq via ORAL
  Filled 2023-06-22: qty 2

## 2023-06-22 MED ORDER — PROCHLORPERAZINE EDISYLATE 10 MG/2ML IJ SOLN
10.0000 mg | Freq: Once | INTRAMUSCULAR | Status: AC
  Administered 2023-06-22: 10 mg via INTRAVENOUS
  Filled 2023-06-22: qty 2

## 2023-06-22 MED ORDER — ONDANSETRON HCL 4 MG/2ML IJ SOLN
4.0000 mg | Freq: Once | INTRAMUSCULAR | Status: AC
  Administered 2023-06-22: 4 mg via INTRAVENOUS
  Filled 2023-06-22: qty 2

## 2023-06-22 MED ORDER — DICYCLOMINE HCL 20 MG PO TABS: ORAL_TABLET | ORAL | 0 refills | Status: DC

## 2023-06-22 MED ORDER — FENTANYL CITRATE PF 50 MCG/ML IJ SOSY
50.0000 ug | PREFILLED_SYRINGE | Freq: Once | INTRAMUSCULAR | Status: AC
  Administered 2023-06-22: 50 ug via INTRAVENOUS
  Filled 2023-06-22: qty 1

## 2023-06-22 MED ORDER — FAMOTIDINE IN NACL 20-0.9 MG/50ML-% IV SOLN
20.0000 mg | Freq: Once | INTRAVENOUS | Status: AC
  Administered 2023-06-22: 20 mg via INTRAVENOUS
  Filled 2023-06-22: qty 50

## 2023-06-22 NOTE — ED Notes (Signed)
Pt returned from CT Scan 

## 2023-06-22 NOTE — ED Notes (Signed)
No urine at this time 

## 2023-06-22 NOTE — ED Provider Notes (Signed)
AP-EMERGENCY DEPT [facility] Emergency Department Provider Note MRN:  [id number]  Arrival date & time: 06/22/23     Chief Complaint   Abdominal Pain   History of Present Illness   Christine Mcdowell is a 52 y.o. year-old female with a history of CHF presenting to the ED with chief complaint of abdominal pain.  Lower abdominal cramping type pain with nausea vomiting and diarrhea.  20+ episodes of vomiting.  Feels dehydrated and worn out.  Denies fever.  No chest pain or shortness of breath.  Review of Systems  A thorough review of systems was obtained and all systems are negative except as noted in the HPI and PMH.   Patient's Health History    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   Discoid lupus    Fibromyalgia    History of blood transfusion "several"   "related to anemia; had some w/hysterectomy also"   Hx of cardiovascular stress test    ETT-Myoview (9/15):  No ischemia, EF 52%; NORMAL   Hx of echocardiogram    Echo (9/15):  EF 50-55%, ant HK, Gr 1 DD, mild MR, mild LAE, no effusion   Hypertension    Iron deficiency anemia    h/o iron transfusions   Lupus (systemic lupus erythematosus) (HCC)    Migraine    "a few/year" (07/03/2016)   Pneumonia 12/2015   RA (rheumatoid arthritis) (HCC)    "all over" (07/03/2016)   Sickle cell trait (HCC)    Stroke (HCC) 2014 X 1; 2015 X 2; 2016 X 1;    "right side of face more relaxed than the other; rare speech hesitation" (07/03/2016)   Vaginal Pap smear, abnormal    ASCUS; HPV    Past Surgical History:  Procedure Laterality Date   ABDOMINAL HYSTERECTOMY  2009   ABDOMINAL WOUND DEHISCENCE  2009   BUNIONECTOMY Left 06/15/2020   CARDIAC CATHETERIZATION N/A 10/11/2016    Procedure: Right/Left Heart Cath and Coronary Angiography;  Surgeon: Dolores Patty, MD;  Location: [facility] INVASIVE CV LAB;  Service: Cardiovascular;  Laterality: N/A;   DILATION AND CURETTAGE OF UTERUS  1991   HEMATOMA EVACUATION  2009   abdomen   ICD IMPLANT  01/28/2018   ICD IMPLANT N/A 01/28/2018   Procedure: ICD IMPLANT;  Surgeon: Marinus Maw, MD;  Location: [facility] INVASIVE CV LAB;  Service: Cardiovascular;  Laterality: N/A;   INCISE AND DRAIN ABCESS  2009 X 2   "abdomen after hysterectomy"   KNEE ARTHROSCOPY Right 1997   KNEE SURGERY Right    TUBAL LIGATION  1996    Family History  Problem Relation Age of Onset   Arthritis Mother    Heart murmur Mother    Drug abuse Mother    Allergies Mother    Heart attack Father    Cushing syndrome Father    Depression Father    Allergies Father    Dementia Paternal Grandmother    Cancer Paternal Grandfather    Diabetes Maternal Grandmother    Hypertension Maternal Grandmother    Asthma Maternal Grandmother    Heart attack Maternal Grandfather    Breast cancer Neg Hx     Social History   Socioeconomic History  Marital status: Single    Spouse name: Not on file   Number of children: Not on file   Years of education: Not on file   Highest education level: Some college, no degree  Occupational History   Not on file  Tobacco Use   Smoking status: Never   Smokeless tobacco: Never  Vaping Use   Vaping Use: Never used  Substance and Sexual Activity   Alcohol use: No   Drug use: No   Sexual activity: Not Currently    Birth control/protection: Surgical    Comment: hyst  Other Topics Concern   Not on file  Social History Narrative   Not on file   Social Determinants of Health   Financial Resource Strain: High Risk (05/26/2023)   Overall Financial Resource Strain (CARDIA)    Difficulty of Paying Living Expenses: Hard  Food Insecurity: Food Insecurity Present (05/26/2023)   Hunger Vital Sign    Worried About Running Out of  Food in the Last Year: Sometimes true    Ran Out of Food in the Last Year: Sometimes true  Transportation Needs: No Transportation Needs (05/26/2023)   PRAPARE - Administrator, Civil Service (Medical): No    Lack of Transportation (Non-Medical): No  Physical Activity: Insufficiently Active (05/26/2023)   Exercise Vital Sign    Days of Exercise per Week: 4 days    Minutes of Exercise per Session: 30 min  Stress: No Stress Concern Present (05/26/2023)   [employer] of Occupational Health - Occupational Stress Questionnaire    Feeling of Stress : Only a little  Social Connections: Moderately Integrated (05/26/2023)   Social Connection and Isolation Panel [NHANES]    Frequency of Communication with Friends and Family: More than three times a week    Frequency of Social Gatherings with Friends and Family: Once a week    Attends Religious Services: More than 4 times per year    Active Member of [employer] or Organizations: Yes    Attends Engineer, structural: More than 4 times per year    Marital Status: Divorced  Intimate Partner Violence: Not At Risk (09/21/2022)   Humiliation, Afraid, Rape, and Kick questionnaire    Fear of Current or Ex-Partner: No    Emotionally Abused: No    Physically Abused: No    Sexually Abused: No     Physical Exam   Vitals:   06/22/23 0521  BP: (!) 147/69  Pulse: 85  Resp: 18  Temp: 98 F (36.7 C)  SpO2: 97%    CONSTITUTIONAL: Well-appearing, NAD, appears tired NEURO/PSYCH:  Alert and oriented x 3, no focal deficits EYES:  eyes equal and reactive ENT/NECK:  no LAD, no JVD CARDIO: Regular rate, well-perfused, normal S1 and S2 PULM:  CTAB no wheezing or rhonchi GI/GU:  non-distended, non-tender MSK/SPINE:  No gross deformities, no edema SKIN:  no rash, atraumatic   *Additional and/or pertinent findings included in MDM below  Diagnostic and Interventional Summary    EKG Interpretation Date/Time:    Ventricular Rate:    PR  Interval:    QRS Duration:    QT Interval:    QTC Calculation:   R Axis:      Text Interpretation:         Labs Reviewed  COMPREHENSIVE METABOLIC PANEL - Abnormal; Notable for the following components:      Result Value   Potassium 2.8 (*)    BUN 29 (*)    Creatinine, Ser 1.13 (*)  Total Protein 8.8 (*)    GFR, Estimated 58 (*)    All other components within normal limits  CBC  LIPASE, BLOOD  URINALYSIS, ROUTINE W REFLEX MICROSCOPIC    CT ABDOMEN PELVIS WO CONTRAST    (Results Pending)    Medications  potassium chloride SA (KLOR-CON M) CR tablet 40 mEq (has no administration in time range)  ketorolac (TORADOL) 15 MG/ML injection 15 mg (has no administration in time range)  diphenhydrAMINE (BENADRYL) injection 25 mg (has no administration in time range)  prochlorperazine (COMPAZINE) injection 10 mg (has no administration in time range)  sodium chloride 0.9 % bolus 1,000 mL (1,000 mLs Intravenous New Bag/Given 06/22/23 0601)  ondansetron (ZOFRAN) injection 4 mg (4 mg Intravenous Given 06/22/23 0602)  fentaNYL (SUBLIMAZE) injection 50 mcg (50 mcg Intravenous Given 06/22/23 0558)  famotidine (PEPCID) IVPB 20 mg premix (0 mg Intravenous Stopped 06/22/23 0701)     Procedures  /  Critical Care Procedures  ED Course and Medical Decision Making  Initial Impression and Ddx Abdomen is soft and nontender with no rebound guarding or rigidity.  Copious nausea vomiting and diarrhea, most likely viral gastroenteritis, will check screening labs, provide fluids, symptomatic management and reassess.  Past medical/surgical history that increases complexity of ED encounter: None  Interpretation of Diagnostics I personally reviewed the laboratory assessment and my interpretation is as follows: No significant blood count or electrolyte disturbance    Patient Reassessment and Ultimate Disposition/Management     Patient with continued symptoms, will obtain CT abdomen and provide further  symptomatic control.  Signed out to oncoming provider.  Patient management required discussion with the following services or consulting groups:  None  Complexity of Problems Addressed Acute illness or injury that poses threat of life of bodily function  Additional Data Reviewed and Analyzed Further history obtained from: None  Additional Factors Impacting ED Encounter Risk Use of parenteral controlled substances and Consideration of hospitalization  Elmer Sow. Pilar Plate, MD [facility] Emergency Medicine [facility] [email or web address]  Final Clinical Impressions(s) / ED Diagnoses     ICD-10-CM   1. Generalized abdominal pain  R10.84     2. Nausea vomiting and diarrhea  R11.2    R19.7       ED Discharge Orders     None        Discharge Instructions Discussed with and Provided to Patient:   Discharge Instructions   None      Sabas Sous, MD 06/22/23 [phone]

## 2023-06-22 NOTE — ED Notes (Signed)
Pt is currently using the restroom at this at assisted to the bedside commode.

## 2023-06-22 NOTE — Discharge Instructions (Addendum)
Drink plenty of fluids.  Follow-up with your family doctor later this week.  Return if any problems.  Your family doctor will need to schedule a CT scan of your chest sometime in the next couple of months.

## 2023-06-22 NOTE — ED Notes (Signed)
Pt transferred independently to bedside commode to try to provide urine specimen. Pt unable to provide urine specimen at this time.

## 2023-06-22 NOTE — ED Triage Notes (Signed)
Pt to ED via [facility], from home, c/o n/v/d since 700 pm last night.

## 2023-06-24 ENCOUNTER — Telehealth: Payer: Self-pay

## 2023-06-24 ENCOUNTER — Encounter: Payer: Self-pay | Admitting: Nurse Practitioner

## 2023-06-24 DIAGNOSIS — E1142 Type 2 diabetes mellitus with diabetic polyneuropathy: Secondary | ICD-10-CM | POA: Insufficient documentation

## 2023-06-24 NOTE — Transitions of Care (Post Inpatient/ED Visit) (Signed)
   06/24/2023  Name: Christine Mcdowell MRN: [id number] DOB: 11-Feb-1971  Today's TOC FU Call Status: Today's TOC FU Call Status:: Unsuccessul Call (1st Attempt) Unsuccessful Call (1st Attempt) Date: 06/24/23  Red on EMMI-ED Discharge Alert Date & Reason:06/23/23 "Scheduled follow-up appt? No"   Attempted to reach the patient regarding the most recent Inpatient/ED visit.  Follow Up Plan: Additional outreach attempts will be made to reach the patient to complete the Transitions of Care (Post Inpatient/ED visit) call.     Antionette Fairy, RN,BSN,CCM [facility] Health/[facility] Care Management Care Management Community Coordinator Direct Phone: [phone] Toll Free: [phone] Fax: [phone]

## 2023-06-24 NOTE — Progress Notes (Signed)
Remote ICD transmission.   

## 2023-06-25 ENCOUNTER — Telehealth: Payer: Self-pay

## 2023-06-25 NOTE — Transitions of Care (Post Inpatient/ED Visit) (Signed)
   06/25/2023  Name: Nivea Rippel MRN: [id number] DOB: 07/24/1971  Today's TOC FU Call Status: Today's TOC FU Call Status:: Unsuccessful Call (2nd Attempt) Unsuccessful Call (2nd Attempt) Date: 06/25/23  Attempted to reach the patient regarding the most recent Inpatient/ED visit.  Follow Up Plan: Additional outreach attempts will be made to reach the patient to complete the Transitions of Care (Post Inpatient/ED visit) call.      Antionette Fairy, RN,BSN,CCM [facility] Care Management Care Management Community Coordinator Direct Phone: [phone] Toll Free: [phone] Fax: [phone]

## 2023-06-25 NOTE — Transitions of Care (Post Inpatient/ED Visit) (Signed)
   06/25/2023  Name: Christine Mcdowell MRN: [id number] DOB: [birth year]  Today's TOC FU Call Status: Today's TOC FU Call Status:: Unsuccessful Call (3rd Attempt) Unsuccessful Call (3rd Attempt) Date: 06/25/23  Attempted to reach the patient regarding the most recent Inpatient/ED visit.  Follow Up Plan: No further outreach attempts will be made at this time. We have been unable to contact the patient.     Antionette Fairy, RN,BSN,CCM [facility] Care Management Care Management Community Coordinator Direct Phone: [phone] Toll Free: [phone] Fax: [phone]

## 2023-07-04 DIAGNOSIS — Z794 Long term (current) use of insulin: Secondary | ICD-10-CM | POA: Diagnosis not present

## 2023-07-04 DIAGNOSIS — J343 Hypertrophy of nasal turbinates: Secondary | ICD-10-CM | POA: Diagnosis not present

## 2023-07-04 DIAGNOSIS — J329 Chronic sinusitis, unspecified: Secondary | ICD-10-CM | POA: Diagnosis not present

## 2023-07-04 DIAGNOSIS — J342 Deviated nasal septum: Secondary | ICD-10-CM | POA: Diagnosis not present

## 2023-07-04 DIAGNOSIS — E119 Type 2 diabetes mellitus without complications: Secondary | ICD-10-CM | POA: Diagnosis not present

## 2023-07-12 DIAGNOSIS — E1165 Type 2 diabetes mellitus with hyperglycemia: Secondary | ICD-10-CM | POA: Diagnosis not present

## 2023-07-16 ENCOUNTER — Ambulatory Visit: Payer: Self-pay

## 2023-07-16 DIAGNOSIS — M545 Low back pain, unspecified: Secondary | ICD-10-CM | POA: Diagnosis not present

## 2023-07-16 DIAGNOSIS — G8929 Other chronic pain: Secondary | ICD-10-CM | POA: Diagnosis not present

## 2023-07-16 NOTE — Patient Outreach (Signed)
  Care Coordination   07/16/2023 Name: Christine Mcdowell MRN: [id number] DOB: [birth year]   Care Coordination Outreach Attempts:  An unsuccessful telephone outreach was attempted for a scheduled appointment today.  Follow Up Plan:  Additional outreach attempts will be made to offer the patient care coordination information and services.   Encounter Outcome:  Pt. Request to Call Back   Care Coordination Interventions:  No, not indicated    [name], RN, BSN, CCM Care Management Coordinator [facility] Care Management  Direct Phone: [phone]

## 2023-07-22 ENCOUNTER — Ambulatory Visit: Payer: Self-pay

## 2023-07-22 NOTE — Patient Outreach (Signed)
  Care Coordination   Follow Up Visit Note   07/22/2023 Name: Christine Mcdowell MRN: [id number] DOB: 11/03/1971  Christine Mcdowell is a 52 y.o. year old female who sees Arnette Felts, FNP for primary care. I spoke with  Christine Mcdowell by phone today.  What matters to the patients health and wellness today?  Patient would like to undergo sinus surgery to improve her chronic sinusitis.     Goals Addressed             This Visit's Progress    To better manage chronic sinusitis       Care Coordination Interventions: Evaluation of current treatment plan related to chronic sinusitis  and patient's adherence to plan as established by provider Determined patient completed her new patient consultation with Dr. Hipolito Bayley with [facility] Ears, Nose and Throat Review of patient status, including review of consultant's reports, and recommendations for sinus surgery  Discussed with patient she will complete a preoperative visit on 10/08/23, she will undergo the procedure on 10/21/23 Educated patient about using a high risk medication such as Methotrexate and this may delay healing post surgery Encouraged patient to discuss recommendations for holding this medication prior to and after surgery with her Rheumatologist as well as her ENT surgeon to help achieve best outcome with post surgical healing     Interventions Today    Flowsheet Row Most Recent Value  Chronic Disease   Chronic disease during today's visit Other  [chronic sinusitis]  General Interventions   General Interventions Discussed/Reviewed General Interventions Reviewed, General Interventions Discussed, Doctor Visits  Doctor Visits Discussed/Reviewed Doctor Visits Reviewed, Doctor Visits Discussed, Specialist  Education Interventions   Education Provided Provided Education  Provided Verbal Education On When to see the doctor          SDOH assessments and interventions completed:  No     Care Coordination  Interventions:  Yes, provided   Follow up plan: Follow up call scheduled for 10/10/23 @09 :00 AM    Encounter Outcome:  Pt. Visit Completed

## 2023-07-22 NOTE — Patient Instructions (Signed)
Visit Information  Thank you for taking time to visit with me today. Please don't hesitate to contact me if I can be of assistance to you.   Following are the goals we discussed today:   Goals Addressed             This Visit's Progress    To better manage chronic sinusitis       Care Coordination Interventions: Evaluation of current treatment plan related to chronic sinusitis  and patient's adherence to plan as established by provider Determined patient completed her new patient consultation with Dr. Hipolito Bayley with [facility] Ears, Nose and Throat Review of patient status, including review of consultant's reports, and recommendations for sinus surgery  Discussed with patient she will complete a preoperative visit on 10/08/23, she will undergo the procedure on 10/21/23 Educated patient about using a high risk medication such as Methotrexate and this may delay healing post surgery Encouraged patient to discuss recommendations for holding this medication prior to and after surgery with her Rheumatologist as well as her ENT surgeon to help achieve best outcome with post surgical healing         Our next appointment is by telephone on 10/10/23 at 09:00 AM  Please call the care guide team at [phone] if you need to cancel or reschedule your appointment.   If you are experiencing a Mental Health or Behavioral Health Crisis or need someone to talk to, please call 1-800-273-TALK (toll free, 24 hour hotline)  Patient verbalizes understanding of instructions and care plan provided today and agrees to view in MyChart. Active MyChart status and patient understanding of how to access instructions and care plan via MyChart confirmed with patient.     [name], RN, BSN, CCM Care Management Coordinator [facility] Care Management Direct Phone: [phone]

## 2023-08-06 ENCOUNTER — Other Ambulatory Visit: Payer: Self-pay | Admitting: Nurse Practitioner

## 2023-08-11 DIAGNOSIS — E1165 Type 2 diabetes mellitus with hyperglycemia: Secondary | ICD-10-CM | POA: Diagnosis not present

## 2023-08-12 ENCOUNTER — Other Ambulatory Visit (HOSPITAL_COMMUNITY): Payer: Self-pay | Admitting: Internal Medicine

## 2023-08-25 ENCOUNTER — Other Ambulatory Visit: Payer: Self-pay | Admitting: Nurse Practitioner

## 2023-08-27 ENCOUNTER — Ambulatory Visit (INDEPENDENT_AMBULATORY_CARE_PROVIDER_SITE_OTHER): Payer: 59

## 2023-08-27 DIAGNOSIS — I420 Dilated cardiomyopathy: Secondary | ICD-10-CM | POA: Diagnosis not present

## 2023-08-28 LAB — CUP PACEART REMOTE DEVICE CHECK
Battery Remaining Longevity: 126 mo
Battery Remaining Percentage: 88 %
Brady Statistic RV Percent Paced: 0 %
Date Time Interrogation Session: 20240904045100
HighPow Impedance: 73 Ohm
Implantable Lead Connection Status: 753985
Implantable Lead Implant Date: 20190206
Implantable Lead Location: 753860
Implantable Lead Model: 292
Implantable Lead Serial Number: 438194
Implantable Pulse Generator Implant Date: 20190206
Lead Channel Impedance Value: 600 Ohm
Lead Channel Setting Pacing Amplitude: 2.5 V
Lead Channel Setting Pacing Pulse Width: 0.4 ms
Lead Channel Setting Sensing Sensitivity: 0.5 mV
Pulse Gen Serial Number: 243572
Zone Setting Status: 755011

## 2023-09-10 DIAGNOSIS — E1165 Type 2 diabetes mellitus with hyperglycemia: Secondary | ICD-10-CM | POA: Diagnosis not present

## 2023-09-10 NOTE — Progress Notes (Signed)
Remote ICD transmission.   

## 2023-09-21 ENCOUNTER — Other Ambulatory Visit (HOSPITAL_COMMUNITY): Payer: Self-pay | Admitting: Internal Medicine

## 2023-10-07 ENCOUNTER — Telehealth: Payer: Self-pay | Admitting: *Deleted

## 2023-10-07 NOTE — Telephone Encounter (Signed)
Our office received notes stating needing clearance for the pt's device. I will hand off this form to the device clinic. I left message that if needing cardiac/medical clearance as well then please fax clearance request over [phone].

## 2023-10-08 DIAGNOSIS — J329 Chronic sinusitis, unspecified: Secondary | ICD-10-CM | POA: Diagnosis not present

## 2023-10-10 ENCOUNTER — Ambulatory Visit: Payer: Self-pay

## 2023-10-10 DIAGNOSIS — E1165 Type 2 diabetes mellitus with hyperglycemia: Secondary | ICD-10-CM | POA: Diagnosis not present

## 2023-10-10 DIAGNOSIS — Z794 Long term (current) use of insulin: Secondary | ICD-10-CM | POA: Diagnosis not present

## 2023-10-10 DIAGNOSIS — E119 Type 2 diabetes mellitus without complications: Secondary | ICD-10-CM | POA: Diagnosis not present

## 2023-10-10 NOTE — Patient Outreach (Signed)
Care Coordination   Follow Up Visit Note   10/10/2023 Name: Christine Mcdowell MRN: [id number] DOB: 04-03-1971  Christine Mcdowell is a 52 y.o. year old female who sees Arnette Felts, FNP for primary care. I spoke with  Christine Mcdowell by phone today.  What matters to the patients health and wellness today?  Patient would like to undergo her sinus surgery without complications.     Goals Addressed             This Visit's Progress    To better manage chronic sinusitis   On track    Care Coordination Interventions: Evaluation of current treatment plan related to chronic sinusitis  and patient's adherence to plan as established by provider Determined patient completed her new patient consultation with Christine Mcdowell with [facility] Ears, Nose and Throat Review of patient status, including review of consultant's reports, and recommendations for sinus surgery  Reviewed and discussed patient's upcoming sinus surgery scheduled for 10/21/23, she has completed her preoperative exam and received clearance from her health care team Determined patient will spend approximately 1 night in the hospital following her procedure and will go home to stay with her son and daughter in law following the surgery Determined patient has a good understanding of her preoperative instructions in order to prepare for surgery  Discussed plans with patient for ongoing care coordination follow up and provided patient with direct contact information for nurse care coordinator    Interventions Today    Flowsheet Row Most Recent Value  Chronic Disease   Chronic disease during today's visit Other  [chronic sinusitis]  General Interventions   General Interventions Discussed/Reviewed General Interventions Discussed, General Interventions Reviewed, Doctor Visits  Doctor Visits Discussed/Reviewed Doctor Visits Discussed, Doctor Visits Reviewed, Specialist  Education Interventions   Education Provided Provided  Education  Provided Verbal Education On When to see the doctor, Medication  Pharmacy Interventions   Pharmacy Dicussed/Reviewed Pharmacy Topics Discussed, Pharmacy Topics Reviewed, Medications and their functions          SDOH assessments and interventions completed:  No     Care Coordination Interventions:  Yes, provided   Follow up plan: Follow up call scheduled for 11/04/23 @11 :00 AM    Encounter Outcome:  Patient Visit Completed

## 2023-10-10 NOTE — Patient Instructions (Signed)
Visit Information  Thank you for taking time to visit with me today. Please don't hesitate to contact me if I can be of assistance to you.   Following are the goals we discussed today:   Goals Addressed             This Visit's Progress    To better manage chronic sinusitis   On track    Care Coordination Interventions: Evaluation of current treatment plan related to chronic sinusitis  and patient's adherence to plan as established by provider Determined patient completed her new patient consultation with Dr. Hipolito Bayley with [facility] Ears, Nose and Throat Review of patient status, including review of consultant's reports, and recommendations for sinus surgery  Reviewed and discussed patient's upcoming sinus surgery scheduled for 10/21/23, she has completed her preoperative exam and received clearance from her health care team Determined patient will spend approximately 1 night in the hospital following her procedure and will go home to stay with her son and daughter in law following the surgery Determined patient has a good understanding of her preoperative instructions in order to prepare for surgery  Discussed plans with patient for ongoing care coordination follow up and provided patient with direct contact information for nurse care coordinator        Our next appointment is by telephone on 11/04/23 at 11:00 AM  Please call the care guide team at [phone] if you need to cancel or reschedule your appointment.   If you are experiencing a Mental Health or Behavioral Health Crisis or need someone to talk to, please call 1-800-273-TALK (toll free, 24 hour hotline)  Patient verbalizes understanding of instructions and care plan provided today and agrees to view in MyChart. Active MyChart status and patient understanding of how to access instructions and care plan via MyChart confirmed with patient.     [name] RN BSN CCM [place]  [facility],  [facility] Nurse Care Coordinator  Direct Dial: [phone] Website: [email or web address]

## 2023-10-14 DIAGNOSIS — Z794 Long term (current) use of insulin: Secondary | ICD-10-CM | POA: Diagnosis not present

## 2023-10-14 DIAGNOSIS — E119 Type 2 diabetes mellitus without complications: Secondary | ICD-10-CM | POA: Diagnosis not present

## 2023-10-20 DIAGNOSIS — D688 Other specified coagulation defects: Secondary | ICD-10-CM | POA: Diagnosis not present

## 2023-10-21 DIAGNOSIS — J343 Hypertrophy of nasal turbinates: Secondary | ICD-10-CM | POA: Diagnosis not present

## 2023-10-21 DIAGNOSIS — I509 Heart failure, unspecified: Secondary | ICD-10-CM | POA: Diagnosis not present

## 2023-10-21 DIAGNOSIS — Z95810 Presence of automatic (implantable) cardiac defibrillator: Secondary | ICD-10-CM | POA: Diagnosis not present

## 2023-10-21 DIAGNOSIS — J342 Deviated nasal septum: Secondary | ICD-10-CM | POA: Diagnosis not present

## 2023-10-21 DIAGNOSIS — E669 Obesity, unspecified: Secondary | ICD-10-CM | POA: Diagnosis not present

## 2023-10-21 DIAGNOSIS — G4733 Obstructive sleep apnea (adult) (pediatric): Secondary | ICD-10-CM | POA: Diagnosis not present

## 2023-10-21 DIAGNOSIS — J45909 Unspecified asthma, uncomplicated: Secondary | ICD-10-CM | POA: Diagnosis not present

## 2023-10-21 DIAGNOSIS — G4730 Sleep apnea, unspecified: Secondary | ICD-10-CM | POA: Diagnosis not present

## 2023-10-21 DIAGNOSIS — J329 Chronic sinusitis, unspecified: Secondary | ICD-10-CM | POA: Diagnosis not present

## 2023-10-21 DIAGNOSIS — E119 Type 2 diabetes mellitus without complications: Secondary | ICD-10-CM | POA: Diagnosis not present

## 2023-10-21 DIAGNOSIS — U099 Post covid-19 condition, unspecified: Secondary | ICD-10-CM | POA: Diagnosis not present

## 2023-10-21 DIAGNOSIS — M797 Fibromyalgia: Secondary | ICD-10-CM | POA: Diagnosis not present

## 2023-10-21 DIAGNOSIS — J95830 Postprocedural hemorrhage and hematoma of a respiratory system organ or structure following a respiratory system procedure: Secondary | ICD-10-CM | POA: Diagnosis not present

## 2023-10-21 DIAGNOSIS — Z7982 Long term (current) use of aspirin: Secondary | ICD-10-CM | POA: Diagnosis not present

## 2023-10-21 DIAGNOSIS — I110 Hypertensive heart disease with heart failure: Secondary | ICD-10-CM | POA: Diagnosis not present

## 2023-10-21 DIAGNOSIS — E099 Drug or chemical induced diabetes mellitus without complications: Secondary | ICD-10-CM | POA: Diagnosis not present

## 2023-10-21 DIAGNOSIS — Z79631 Long term (current) use of antimetabolite agent: Secondary | ICD-10-CM | POA: Diagnosis not present

## 2023-10-21 DIAGNOSIS — M069 Rheumatoid arthritis, unspecified: Secondary | ICD-10-CM | POA: Diagnosis not present

## 2023-10-21 DIAGNOSIS — Z8673 Personal history of transient ischemic attack (TIA), and cerebral infarction without residual deficits: Secondary | ICD-10-CM | POA: Diagnosis not present

## 2023-10-21 DIAGNOSIS — M329 Systemic lupus erythematosus, unspecified: Secondary | ICD-10-CM | POA: Diagnosis not present

## 2023-10-21 DIAGNOSIS — Z794 Long term (current) use of insulin: Secondary | ICD-10-CM | POA: Diagnosis not present

## 2023-10-21 HISTORY — PX: NASAL SINUS SURGERY: SHX719

## 2023-10-22 DIAGNOSIS — Z8673 Personal history of transient ischemic attack (TIA), and cerebral infarction without residual deficits: Secondary | ICD-10-CM | POA: Diagnosis not present

## 2023-10-22 DIAGNOSIS — Z794 Long term (current) use of insulin: Secondary | ICD-10-CM | POA: Diagnosis not present

## 2023-10-22 DIAGNOSIS — J343 Hypertrophy of nasal turbinates: Secondary | ICD-10-CM | POA: Diagnosis not present

## 2023-10-22 DIAGNOSIS — I110 Hypertensive heart disease with heart failure: Secondary | ICD-10-CM | POA: Diagnosis not present

## 2023-10-22 DIAGNOSIS — E099 Drug or chemical induced diabetes mellitus without complications: Secondary | ICD-10-CM | POA: Diagnosis not present

## 2023-10-22 DIAGNOSIS — Z7982 Long term (current) use of aspirin: Secondary | ICD-10-CM | POA: Diagnosis not present

## 2023-10-22 DIAGNOSIS — M069 Rheumatoid arthritis, unspecified: Secondary | ICD-10-CM | POA: Diagnosis not present

## 2023-10-22 DIAGNOSIS — J329 Chronic sinusitis, unspecified: Secondary | ICD-10-CM | POA: Diagnosis not present

## 2023-10-22 DIAGNOSIS — Z79631 Long term (current) use of antimetabolite agent: Secondary | ICD-10-CM | POA: Diagnosis not present

## 2023-10-22 DIAGNOSIS — J45909 Unspecified asthma, uncomplicated: Secondary | ICD-10-CM | POA: Diagnosis not present

## 2023-10-22 DIAGNOSIS — Z95810 Presence of automatic (implantable) cardiac defibrillator: Secondary | ICD-10-CM | POA: Diagnosis not present

## 2023-10-22 DIAGNOSIS — U099 Post covid-19 condition, unspecified: Secondary | ICD-10-CM | POA: Diagnosis not present

## 2023-10-22 DIAGNOSIS — I509 Heart failure, unspecified: Secondary | ICD-10-CM | POA: Diagnosis not present

## 2023-10-22 DIAGNOSIS — J342 Deviated nasal septum: Secondary | ICD-10-CM | POA: Diagnosis not present

## 2023-10-22 DIAGNOSIS — M329 Systemic lupus erythematosus, unspecified: Secondary | ICD-10-CM | POA: Diagnosis not present

## 2023-10-22 DIAGNOSIS — G4733 Obstructive sleep apnea (adult) (pediatric): Secondary | ICD-10-CM | POA: Diagnosis not present

## 2023-10-22 DIAGNOSIS — M797 Fibromyalgia: Secondary | ICD-10-CM | POA: Diagnosis not present

## 2023-10-23 ENCOUNTER — Ambulatory Visit: Payer: Self-pay | Admitting: Nurse Practitioner

## 2023-10-23 DIAGNOSIS — Z794 Long term (current) use of insulin: Secondary | ICD-10-CM | POA: Diagnosis not present

## 2023-10-23 DIAGNOSIS — M797 Fibromyalgia: Secondary | ICD-10-CM | POA: Diagnosis not present

## 2023-10-23 DIAGNOSIS — J343 Hypertrophy of nasal turbinates: Secondary | ICD-10-CM | POA: Diagnosis not present

## 2023-10-23 DIAGNOSIS — I509 Heart failure, unspecified: Secondary | ICD-10-CM | POA: Diagnosis not present

## 2023-10-23 DIAGNOSIS — J45909 Unspecified asthma, uncomplicated: Secondary | ICD-10-CM | POA: Diagnosis not present

## 2023-10-23 DIAGNOSIS — G4733 Obstructive sleep apnea (adult) (pediatric): Secondary | ICD-10-CM | POA: Diagnosis not present

## 2023-10-23 DIAGNOSIS — M069 Rheumatoid arthritis, unspecified: Secondary | ICD-10-CM | POA: Diagnosis not present

## 2023-10-23 DIAGNOSIS — Z8673 Personal history of transient ischemic attack (TIA), and cerebral infarction without residual deficits: Secondary | ICD-10-CM | POA: Diagnosis not present

## 2023-10-23 DIAGNOSIS — U099 Post covid-19 condition, unspecified: Secondary | ICD-10-CM | POA: Diagnosis not present

## 2023-10-23 DIAGNOSIS — Z79631 Long term (current) use of antimetabolite agent: Secondary | ICD-10-CM | POA: Diagnosis not present

## 2023-10-23 DIAGNOSIS — M329 Systemic lupus erythematosus, unspecified: Secondary | ICD-10-CM | POA: Diagnosis not present

## 2023-10-23 DIAGNOSIS — E099 Drug or chemical induced diabetes mellitus without complications: Secondary | ICD-10-CM | POA: Diagnosis not present

## 2023-10-23 DIAGNOSIS — J329 Chronic sinusitis, unspecified: Secondary | ICD-10-CM | POA: Diagnosis not present

## 2023-10-23 DIAGNOSIS — Z7982 Long term (current) use of aspirin: Secondary | ICD-10-CM | POA: Diagnosis not present

## 2023-10-23 DIAGNOSIS — J342 Deviated nasal septum: Secondary | ICD-10-CM | POA: Diagnosis not present

## 2023-10-23 DIAGNOSIS — I110 Hypertensive heart disease with heart failure: Secondary | ICD-10-CM | POA: Diagnosis not present

## 2023-10-23 DIAGNOSIS — Z95810 Presence of automatic (implantable) cardiac defibrillator: Secondary | ICD-10-CM | POA: Diagnosis not present

## 2023-10-27 ENCOUNTER — Encounter (HOSPITAL_COMMUNITY): Payer: Self-pay | Admitting: Internal Medicine

## 2023-10-28 NOTE — Telephone Encounter (Signed)
D Dimer 2,300.00 See [facility] everywhere 10/20/23 Followed by hematology

## 2023-10-30 DIAGNOSIS — J320 Chronic maxillary sinusitis: Secondary | ICD-10-CM | POA: Diagnosis not present

## 2023-10-30 DIAGNOSIS — J329 Chronic sinusitis, unspecified: Secondary | ICD-10-CM | POA: Diagnosis not present

## 2023-10-30 DIAGNOSIS — D649 Anemia, unspecified: Secondary | ICD-10-CM | POA: Diagnosis not present

## 2023-11-04 ENCOUNTER — Encounter: Payer: Self-pay | Admitting: Internal Medicine

## 2023-11-04 ENCOUNTER — Ambulatory Visit: Payer: Self-pay

## 2023-11-04 NOTE — Patient Outreach (Signed)
  Care Coordination   Follow Up Visit Note   11/04/2023 Name: Christine Mcdowell MRN: [id number] DOB: September 03, 1971  Christine Mcdowell is a 52 y.o. year old female who sees Christine Felts, FNP for primary care. I spoke with  Christine Mcdowell by phone today.  What matters to the patients health and wellness today?  Patient would like to f/u with Hematology for treatment of low Hgb and HCT.     Goals Addressed             This Visit's Progress    To better manage chronic sinusitis   On track    Care Coordination Interventions: Evaluation of current treatment plan related to chronic sinusitis  and patient's adherence to plan as established by provider Discussed with patient she underwent sinus surgery as planned on 10/21/23 per Dr. Renata Mcdowell with [facility]  Determined patient experienced a minor complication following her procedure related to epistaxis, requiring 2 units of packed RBC  Discussed patient has completed her post-operative exam with Dr. Renata Mcdowell, she is making a good recovery from her sinus surgery Discussed her concerns and symptoms of shortness of breath and severe fatigue, reviewed with patient her Hgb and HCT from 12 days ago was at 7.8/23.7 Discussed patient was unable to get into [facility] until next week  Discussed patient will send a my chart message to Christine Mcdowell with [facility]ematology with whom she is established, patient will request to be seen as soon as possible  Discussed plans with patient for ongoing care coordination follow up and provided patient with direct contact information for nurse care coordinator    Interventions Today    Flowsheet Row Most Recent Value  Chronic Disease   Chronic disease during today's visit Other  [s/p sinus surgery,  low Hgb/HCT]  General Interventions   General Interventions Discussed/Reviewed General Interventions Discussed, General Interventions Reviewed, Doctor Visits  Doctor Visits Discussed/Reviewed Doctor Visits  Discussed, Doctor Visits Reviewed, PCP, Specialist  Education Interventions   Education Provided Provided Education  Provided Verbal Education On When to see the doctor, Labs  Labs Reviewed --  [Hgb/HCT]  Pharmacy Interventions   Pharmacy Dicussed/Reviewed Pharmacy Topics Discussed, Pharmacy Topics Reviewed          SDOH assessments and interventions completed:  No     Care Coordination Interventions:  Yes, provided   Follow up plan: Follow up call scheduled for 12/02/23 @12 :00 PM    Encounter Outcome:  Patient Visit Completed

## 2023-11-04 NOTE — Patient Instructions (Signed)
Visit Information  Thank you for taking time to visit with me today. Please don't hesitate to contact me if I can be of assistance to you.   Following are the goals we discussed today:   Goals Addressed             This Visit's Progress    To better manage chronic sinusitis   On track    Care Coordination Interventions: Evaluation of current treatment plan related to chronic sinusitis  and patient's adherence to plan as established by provider Discussed with patient she underwent sinus surgery as planned on 10/21/23 per Dr. Renata Caprice with [facility]  Determined patient experienced a minor complication following her procedure related to epistaxis, requiring 2 units of packed RBC  Discussed patient has completed her post-operative exam with Dr. Renata Caprice, she is making a good recovery from her sinus surgery Discussed her concerns and symptoms of shortness of breath and severe fatigue, reviewed with patient her Hgb and HCT from 12 days ago was at 7.8/23.7 Discussed patient was unable to get into [facility] until next week  Discussed patient will send a my chart message to Dr. Arbutus Ped with [facility] with whom she is established, patient will request to be seen as soon as possible  Discussed plans with patient for ongoing care coordination follow up and provided patient with direct contact information for nurse care coordinator        Our next appointment is by telephone on 12/02/23 at 12:00 PM  Please call the care guide team at [phone] if you need to cancel or reschedule your appointment.   If you are experiencing a Mental Health or Behavioral Health Crisis or need someone to talk to, please call 1-800-273-TALK (toll free, 24 hour hotline)  Patient verbalizes understanding of instructions and care plan provided today and agrees to view in MyChart. Active MyChart status and patient understanding of how to access instructions and care plan via MyChart confirmed with  patient.     [name] RN BSN CCM [place]  [facility], [facility] Nurse Care Coordinator  Direct Dial: [phone] Website: [email or web address]

## 2023-11-09 DIAGNOSIS — E1165 Type 2 diabetes mellitus with hyperglycemia: Secondary | ICD-10-CM | POA: Diagnosis not present

## 2023-11-10 DIAGNOSIS — D500 Iron deficiency anemia secondary to blood loss (chronic): Secondary | ICD-10-CM | POA: Diagnosis not present

## 2023-11-10 DIAGNOSIS — D688 Other specified coagulation defects: Secondary | ICD-10-CM | POA: Diagnosis not present

## 2023-11-10 DIAGNOSIS — D508 Other iron deficiency anemias: Secondary | ICD-10-CM | POA: Diagnosis not present

## 2023-11-12 ENCOUNTER — Telehealth: Payer: Self-pay | Admitting: Internal Medicine

## 2023-11-12 NOTE — Telephone Encounter (Signed)
Patient has declined in scheduling any future appointments they have also stated they will be transferring their care over to [facility] in [place] since they will be moving to that area

## 2023-11-13 ENCOUNTER — Other Ambulatory Visit (HOSPITAL_COMMUNITY): Payer: Self-pay | Admitting: Internal Medicine

## 2023-11-13 ENCOUNTER — Other Ambulatory Visit: Payer: Self-pay | Admitting: Nurse Practitioner

## 2023-11-18 DIAGNOSIS — Z9889 Other specified postprocedural states: Secondary | ICD-10-CM | POA: Diagnosis not present

## 2023-11-26 ENCOUNTER — Ambulatory Visit (INDEPENDENT_AMBULATORY_CARE_PROVIDER_SITE_OTHER): Payer: 59

## 2023-11-26 DIAGNOSIS — I420 Dilated cardiomyopathy: Secondary | ICD-10-CM

## 2023-11-26 LAB — CUP PACEART REMOTE DEVICE CHECK
Battery Remaining Longevity: 120 mo
Battery Remaining Percentage: 84 %
Brady Statistic RV Percent Paced: 0 %
Date Time Interrogation Session: 20241204045100
HighPow Impedance: 72 Ohm
Implantable Lead Connection Status: 753985
Implantable Lead Implant Date: 20190206
Implantable Lead Location: 753860
Implantable Lead Model: 292
Implantable Lead Serial Number: 438194
Implantable Pulse Generator Implant Date: 20190206
Lead Channel Impedance Value: 523 Ohm
Lead Channel Setting Pacing Amplitude: 2.5 V
Lead Channel Setting Pacing Pulse Width: 0.4 ms
Lead Channel Setting Sensing Sensitivity: 0.5 mV
Pulse Gen Serial Number: 243572
Zone Setting Status: 755011

## 2023-12-01 DIAGNOSIS — Z79899 Other long term (current) drug therapy: Secondary | ICD-10-CM | POA: Diagnosis not present

## 2023-12-01 DIAGNOSIS — M0579 Rheumatoid arthritis with rheumatoid factor of multiple sites without organ or systems involvement: Secondary | ICD-10-CM | POA: Diagnosis not present

## 2023-12-01 DIAGNOSIS — M329 Systemic lupus erythematosus, unspecified: Secondary | ICD-10-CM | POA: Diagnosis not present

## 2023-12-02 ENCOUNTER — Ambulatory Visit: Payer: Self-pay

## 2023-12-02 NOTE — Patient Outreach (Signed)
  Care Coordination   12/02/2023 Name: Christine Mcdowell MRN: [id number] DOB: [birth year]   Care Coordination Outreach Attempts:  An unsuccessful telephone outreach was attempted for a scheduled appointment today.  Follow Up Plan:  Additional outreach attempts will be made to offer the patient care coordination information and services.   Encounter Outcome:  No Answer   Care Coordination Interventions:  No, not indicated    [name] RN BSN CCM [place]  [facility], [facility] Nurse Care Coordinator  Direct Dial: [phone] Website: [email or web address]

## 2023-12-04 DIAGNOSIS — D688 Other specified coagulation defects: Secondary | ICD-10-CM | POA: Diagnosis not present

## 2023-12-04 DIAGNOSIS — M329 Systemic lupus erythematosus, unspecified: Secondary | ICD-10-CM | POA: Diagnosis not present

## 2023-12-04 DIAGNOSIS — Z9889 Other specified postprocedural states: Secondary | ICD-10-CM | POA: Diagnosis not present

## 2023-12-04 DIAGNOSIS — D649 Anemia, unspecified: Secondary | ICD-10-CM | POA: Diagnosis not present

## 2023-12-09 DIAGNOSIS — E1165 Type 2 diabetes mellitus with hyperglycemia: Secondary | ICD-10-CM | POA: Diagnosis not present

## 2023-12-12 DIAGNOSIS — J320 Chronic maxillary sinusitis: Secondary | ICD-10-CM | POA: Diagnosis not present

## 2023-12-12 DIAGNOSIS — J329 Chronic sinusitis, unspecified: Secondary | ICD-10-CM | POA: Diagnosis not present

## 2023-12-12 DIAGNOSIS — G4733 Obstructive sleep apnea (adult) (pediatric): Secondary | ICD-10-CM | POA: Diagnosis not present

## 2023-12-14 ENCOUNTER — Telehealth: Payer: 59

## 2024-01-01 ENCOUNTER — Telehealth: Payer: 59 | Admitting: Nurse Practitioner

## 2024-01-01 ENCOUNTER — Encounter: Payer: Self-pay | Admitting: Nurse Practitioner

## 2024-01-01 DIAGNOSIS — E1169 Type 2 diabetes mellitus with other specified complication: Secondary | ICD-10-CM | POA: Diagnosis not present

## 2024-01-01 DIAGNOSIS — R051 Acute cough: Secondary | ICD-10-CM

## 2024-01-01 DIAGNOSIS — I110 Hypertensive heart disease with heart failure: Secondary | ICD-10-CM

## 2024-01-01 DIAGNOSIS — R0989 Other specified symptoms and signs involving the circulatory and respiratory systems: Secondary | ICD-10-CM | POA: Diagnosis not present

## 2024-01-01 DIAGNOSIS — E785 Hyperlipidemia, unspecified: Secondary | ICD-10-CM

## 2024-01-01 DIAGNOSIS — E559 Vitamin D deficiency, unspecified: Secondary | ICD-10-CM | POA: Diagnosis not present

## 2024-01-01 DIAGNOSIS — I5022 Chronic systolic (congestive) heart failure: Secondary | ICD-10-CM | POA: Diagnosis not present

## 2024-01-01 DIAGNOSIS — R040 Epistaxis: Secondary | ICD-10-CM | POA: Diagnosis not present

## 2024-01-01 MED ORDER — PROMETHAZINE-DM 6.25-15 MG/5ML PO SYRP: 5.0000 mL | ORAL_SOLUTION | Freq: Four times a day (QID) | ORAL | 0 refills | Status: DC | PRN

## 2024-01-01 MED ORDER — PREDNISONE 20 MG PO TABS: ORAL_TABLET | ORAL | 0 refills | Status: DC

## 2024-01-01 NOTE — Progress Notes (Addendum)
 Virtual Visit via Video Note  Christine Mcdowell, CMA,acting as a scribe for [name], FNP.,have documented all relevant documentation on the behalf of [name], FNP,as directed by  [name], FNP while in the presence of [name], FNP.  I connected with Christine Mcdowell on 01/21/24 at  2:40 PM EST by a video enabled telemedicine application and verified that I am speaking with the correct person using two identifiers.  Patient Location: Home Provider Location: Office/Clinic  I discussed the limitations, risks, security, and privacy concerns of performing an evaluation and management service by video and the availability of in person appointments. I also discussed with the patient that there may be a patient responsible charge related to this service. The patient expressed understanding and agreed to proceed.  Subjective: PCP: Ada Gaines, FNP  Chief Complaint  Patient presents with   Cough   Patient presents today for congestion, cough starting last night which is more dry cough. She has also been feeling aching. She has taken 2 covid test. She had to use her nebulizer last night. Reports did help after using.  However, 2 weeks ago she had a cough, runny nose, fever, headaches.  Patient reports about 2 weeks ago her grandson had RSV- she reports she went to the ENT and they treated her with prednisone  and an antibiotic. She reports she was exposed to covid Sunday but her covid test was negative as of Monday. She had sinus surgery October 27th, today she had nose bleeding prior to the visit and seen them and was given an antibiotic ointment and to use daily. She is taking a cough syrup Tussin DM last night with some relief.     ROS: Per HPI  Current Outpatient Medications:    albuterol  (VENTOLIN  HFA) 108 (90 Base) MCG/ACT inhaler, Inhale 2 puffs into the lungs every 6 (six) hours as needed for wheezing or shortness of breath., Disp: 8 g, Rfl: 0   aspirin  81 MG  chewable tablet, Chew 162 mg by mouth daily. Taking 2 tablets, Disp: , Rfl:    atorvastatin  (LIPITOR) 10 MG tablet, TAKE 1 TABLET BY MOUTH ONCE  DAILY, Disp: 100 tablet, Rfl: 2   carvedilol  (COREG ) 25 MG tablet, TAKE 1 TABLET BY MOUTH IN THE  MORNING AND 1 TABLET BY MOUTH IN THE EVENING WITH MEALS, Disp: 200 tablet, Rfl: 2   Continuous Glucose Sensor (DEXCOM G7 SENSOR) MISC, USE TO CHECK BLOOD GLUCOSE AS  DIRECTED 3 TIMES DAILY AND  CHANGE SENSOR EVERY 10 DAYS, Disp: 10 each, Rfl: 2   diazepam  (VALIUM ) 5 MG tablet, Take 1 tablet (5 mg total) by mouth 2 (two) times daily as needed for anxiety., Disp: 10 tablet, Rfl: 0   diclofenac Sodium (VOLTAREN) 1 % GEL, apply A SMALL AMOUNT TO involved joints UP TO TWICE DAILY, Disp: , Rfl:    dicyclomine  (BENTYL ) 20 MG tablet, Take 1 every 8 hours as needed for abdominal cramping, Disp: 20 tablet, Rfl: 0   ENTRESTO  97-103 MG, TAKE 1 TABLET BY MOUTH TWICE  DAILY, Disp: 200 tablet, Rfl: 2   Fezolinetant  (VEOZAH ) 45 MG TABS, Take 1 tablet (45 mg total) by mouth daily., Disp: 30 tablet, Rfl: 3   Fluticasone  Propionate (XHANCE ) 93 MCG/ACT EXHU, Place 1 spray into the nose in the morning and at bedtime., Disp: 16 mL, Rfl: 2   folic acid  (FOLVITE ) 1 MG tablet, TAKE 1 TABLET BY MOUTH EVERY MORNING, Disp: 30 tablet, Rfl: 0   Glucagon  (GVOKE HYPOPEN   2-PACK) 1 MG/0.2ML SOAJ, Inject 1 mg into the skin as needed., Disp: 0.2 mL, Rfl: 5   hydroxychloroquine  (PLAQUENIL ) 200 MG tablet, Take 1 tablet (200 mg total) by mouth 2 (two) times daily., Disp: 180 tablet, Rfl: 0   insulin  degludec (TRESIBA  FLEXTOUCH) 100 UNIT/ML FlexTouch Pen, INJECT SUBCUTANEOUSLY INTO SKIN  10 UNITS DAILY, Disp: 15 mL, Rfl: 2   Insulin  Pen Needle (NOVOFINE PLUS PEN NEEDLE) 32G X 4 MM MISC, Use with insulin  pens dx code e11.65, Disp: 300 each, Rfl: 3   Ipratropium-Albuterol  (COMBIVENT  RESPIMAT) 20-100 MCG/ACT AERS respimat, Inhale 1 puff into the lungs every 6 (six) hours., Disp: 4 g, Rfl: 5    ipratropium-albuterol  (DUONEB) 0.5-2.5 (3) MG/3ML SOLN, Take 3 mLs by nebulization every 6 (six) hours as needed., Disp: 360 mL, Rfl: 2   Magnesium  400 MG TABS, Take 1 tablet by mouth daily., Disp: 90 tablet, Rfl: 1   meclizine  (ANTIVERT ) 25 MG tablet, Take 1 tablet (25 mg total) by mouth 3 (three) times daily as needed for dizziness., Disp: 30 tablet, Rfl: 0   methotrexate  (RHEUMATREX) 2.5 MG tablet, Take 20 mg by mouth every Friday. , Disp: , Rfl: 3   Methotrexate , PF, 20 MG/0.4ML SOAJ, Inject 20 mg into the skin once a week. Patient taking on Friday's, Disp: , Rfl:    metolazone  (ZAROXOLYN ) 2.5 MG tablet, TAKE 1 TABLET BY MOUTH AS NEEDED FOR WEIGHT GAIN OF 5 POUNDS WITHIN 3 DAYS AS DIRECTED needs appt for further refills, Disp: 5 tablet, Rfl: 0   Multiple Vitamin (MULTIVITAMIN WITH MINERALS) TABS, Take 1 tablet by mouth every morning. , Disp: , Rfl:    OLUMIANT tablet, Take 2 mg by mouth daily., Disp: , Rfl:    ondansetron  (ZOFRAN -ODT) 4 MG disintegrating tablet, 4mg  ODT q4 hours prn nausea/vomit, Disp: 10 tablet, Rfl: 0   potassium chloride  (KLOR-CON  M) 10 MEQ tablet, TAKE 3 TABLETS BY MOUTH TWICE  DAILY, Disp: 600 tablet, Rfl: 2   predniSONE  (DELTASONE ) 20 MG tablet, Take 2 tabs by mouth daily x 3 days, Disp: 6 tablet, Rfl: 0   predniSONE  (DELTASONE ) 5 MG tablet, Take by mouth., Disp: , Rfl:    PROAIR  HFA 108 (90 Base) MCG/ACT inhaler, INHALE 2 PUFFS BY MOUTH EVERY DAY AS NEEDED FOR SHORTNESS OF BREATH, Disp: 18 g, Rfl: 2   promethazine -dextromethorphan  (PROMETHAZINE -DM) 6.25-15 MG/5ML syrup, Take 5 mLs by mouth 4 (four) times daily as needed for cough., Disp: 118 mL, Rfl: 0   spironolactone  (ALDACTONE ) 25 MG tablet, TAKE 1 TABLET BY MOUTH IN THE  MORNING AND 1 TABLET BY MOUTH IN THE EVENING, Disp: 200 tablet, Rfl: 2   Vitamin D , Ergocalciferol , (DRISDOL ) 1.25 MG (50000 UNIT) CAPS capsule, Take 1 capsule (50,000 Units total) by mouth 2 (two) times a week., Disp: 12 capsule, Rfl:  3  Observations/Objective: There were no vitals filed for this visit. Physical Exam Vitals reviewed.  Constitutional:      General: She is not in acute distress.    Appearance: Normal appearance.  Pulmonary:     Effort: Pulmonary effort is normal. No respiratory distress.  Skin:    Capillary Refill: Capillary refill takes less than 2 seconds.  Neurological:     General: No focal deficit present.     Mental Status: She is alert and oriented to person, place, and time.     Cranial Nerves: No cranial nerve deficit.     Motor: No weakness.  Psychiatric:        Mood  and Affect: Mood normal.        Behavior: Behavior normal.        Thought Content: Thought content normal.        Judgment: Judgment normal.     Assessment and Plan: Acute cough Assessment & Plan: Persistent worsening cough in spite of over the counter medications, will treat with prednisone  and she is to monitor her blood sugars.  Also sent Rx for cough syrup advised to not drive or operate heavy machinery when taking  Orders: -     Promethazine -DM; Take 5 mLs by mouth 4 (four) times daily as needed for cough.  Dispense: 118 mL; Refill: 0 -     predniSONE ; Take 2 tabs by mouth daily x 3 days  Dispense: 6 tablet; Refill: 0  Chest congestion  Type 2 diabetes mellitus with hyperlipidemia (HCC) Assessment & Plan: She is to monitor her blood sugars due to taking prednisone . She is to come for her HgbA1c check next week when feeling better  Orders: -     Hemoglobin A1c; Future  Chronic systolic heart failure (HCC)  Vitamin D  deficiency Assessment & Plan: Will check vitamin D  level and supplement as needed.    Also encouraged to spend 15 minutes in the sun daily.     Hypertensive heart disease with chronic systolic congestive heart failure [facility]) Assessment & Plan: Continue f/u with Cardiology and continue medications  Orders: -     CMP14+EGFR; Future    Follow Up Instructions: Return for keep same next;  lab visit on 01/06/24 am.   I discussed the assessment and treatment plan with the patient. The patient was provided an opportunity to ask questions, and all were answered. The patient agreed with the plan and demonstrated an understanding of the instructions.   The patient was advised to call back or seek an in-person evaluation if the symptoms worsen or if the condition fails to improve as anticipated.  The above assessment and management plan was discussed with the patient. The patient verbalized understanding of and has agreed to the management plan.   Christine [name], FNP, have reviewed all documentation for this visit. The documentation on 01/21/24 for the exam, diagnosis, procedures, and orders are all accurate and complete.

## 2024-01-02 ENCOUNTER — Telehealth (HOSPITAL_COMMUNITY): Payer: Self-pay | Admitting: Cardiology

## 2024-01-02 NOTE — Telephone Encounter (Signed)
 Fluid level appears WNL. Routing to staff requesting. 11 NSVT/Vt logged events on 12/27/2022. EGM appears possible atrial tach d/t morphology appears same as presenting. Longest in duration 2 min 36 sec.   Patient reports of palpitations and shortness of breath while walking from her kitchen to the bedroom 12/28/2023. States she sat on the bed for a few minutes then symptoms improved. Reports similar symptoms over the past few days as well. Pt is currently asymptomatic.   Pt reports taking steroids and just completed abx for recent respiratory illness. Explained steroids to pt steroids can make heart increase.   Pt compliant with coreg  12.5 mg in am and 12.5 mg in pm.   Pt given ED precautions for upcoming weekend for chest pain or worsening symtpoms. PT voiced understanding.

## 2024-01-02 NOTE — Telephone Encounter (Signed)
@   [facility] Please assist with remote transmission Thanks

## 2024-01-02 NOTE — Telephone Encounter (Signed)
 Patient called to report elevated b/p readings and increase in SOB  170/90 164/85  No missed doses of medications Reports HA Reports sharp piercing pain at center of chest yesterday reports pain lasted x 1 second-no additional CP   Reports BLE and hand swelling No recent weights to report, however took weight while on the phone and weight is 192 (stable)  -compliant with fluid and sodium restrictions   -reports multiple antibiotics and steroids (sinus surgery, RSV,respiratory infection)  -pt is not taking Lasix  40 daily, unsure of the last dose  Please advise

## 2024-01-02 NOTE — Telephone Encounter (Signed)
 Pt aware and will send transmission shortly

## 2024-01-05 DIAGNOSIS — D508 Other iron deficiency anemias: Secondary | ICD-10-CM | POA: Diagnosis not present

## 2024-01-05 DIAGNOSIS — D688 Other specified coagulation defects: Secondary | ICD-10-CM | POA: Diagnosis not present

## 2024-01-05 NOTE — Telephone Encounter (Signed)
 Pt aware  Reports she taking 12.5 mg BID However chart has 25 mg BID listed  Pt will confirm bottle once she gets home

## 2024-01-05 NOTE — Telephone Encounter (Signed)
 Could you forward to the heart failure team? 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

## 2024-01-06 NOTE — Telephone Encounter (Signed)
 Pt returned call Reports she is taking coreg 25 bid   Will forward to provider if additional med changes are needed

## 2024-01-06 NOTE — Telephone Encounter (Signed)
 Pt aware and voiced understanding

## 2024-01-07 ENCOUNTER — Other Ambulatory Visit: Payer: Self-pay | Admitting: Medical Genetics

## 2024-01-08 DIAGNOSIS — E1165 Type 2 diabetes mellitus with hyperglycemia: Secondary | ICD-10-CM | POA: Diagnosis not present

## 2024-01-12 DIAGNOSIS — E119 Type 2 diabetes mellitus without complications: Secondary | ICD-10-CM | POA: Diagnosis not present

## 2024-01-12 DIAGNOSIS — Z794 Long term (current) use of insulin: Secondary | ICD-10-CM | POA: Diagnosis not present

## 2024-01-13 DIAGNOSIS — I110 Hypertensive heart disease with heart failure: Secondary | ICD-10-CM | POA: Insufficient documentation

## 2024-01-13 DIAGNOSIS — E559 Vitamin D deficiency, unspecified: Secondary | ICD-10-CM | POA: Insufficient documentation

## 2024-01-13 DIAGNOSIS — R0989 Other specified symptoms and signs involving the circulatory and respiratory systems: Secondary | ICD-10-CM | POA: Insufficient documentation

## 2024-01-13 DIAGNOSIS — E1169 Type 2 diabetes mellitus with other specified complication: Secondary | ICD-10-CM | POA: Insufficient documentation

## 2024-01-14 NOTE — Assessment & Plan Note (Addendum)
She is to monitor her blood sugars due to taking prednisone. She is to come for her HgbA1c check next week when feeling better

## 2024-01-14 NOTE — Assessment & Plan Note (Deleted)
Continue f/u with Cardiology and continue medications

## 2024-01-14 NOTE — Assessment & Plan Note (Signed)
Will check vitamin D level and supplement as needed.    Also encouraged to spend 15 minutes in the sun daily.   

## 2024-01-14 NOTE — Assessment & Plan Note (Addendum)
Persistent worsening cough in spite of over the counter medications, will treat with prednisone and she is to monitor her blood sugars.  Also sent Rx for cough syrup advised to not drive or operate heavy machinery when taking

## 2024-01-20 DIAGNOSIS — R0981 Nasal congestion: Secondary | ICD-10-CM | POA: Diagnosis not present

## 2024-01-20 DIAGNOSIS — D688 Other specified coagulation defects: Secondary | ICD-10-CM | POA: Diagnosis not present

## 2024-01-20 DIAGNOSIS — Z9889 Other specified postprocedural states: Secondary | ICD-10-CM | POA: Diagnosis not present

## 2024-01-21 DIAGNOSIS — I110 Hypertensive heart disease with heart failure: Secondary | ICD-10-CM | POA: Insufficient documentation

## 2024-01-21 NOTE — Assessment & Plan Note (Signed)
Continue f/u with Cardiology and continue medications

## 2024-01-21 NOTE — Addendum Note (Signed)
Addended by: Arnette Felts F on: 01/21/2024 10:18 AM   Modules accepted: Orders

## 2024-01-27 DIAGNOSIS — M329 Systemic lupus erythematosus, unspecified: Secondary | ICD-10-CM | POA: Diagnosis not present

## 2024-01-27 DIAGNOSIS — Z79899 Other long term (current) drug therapy: Secondary | ICD-10-CM | POA: Diagnosis not present

## 2024-01-27 DIAGNOSIS — M351 Other overlap syndromes: Secondary | ICD-10-CM | POA: Diagnosis not present

## 2024-01-27 DIAGNOSIS — M0579 Rheumatoid arthritis with rheumatoid factor of multiple sites without organ or systems involvement: Secondary | ICD-10-CM | POA: Diagnosis not present

## 2024-01-27 DIAGNOSIS — M533 Sacrococcygeal disorders, not elsewhere classified: Secondary | ICD-10-CM | POA: Diagnosis not present

## 2024-01-29 ENCOUNTER — Encounter: Payer: 59 | Admitting: Nurse Practitioner

## 2024-02-02 DIAGNOSIS — Z9889 Other specified postprocedural states: Secondary | ICD-10-CM | POA: Diagnosis not present

## 2024-02-02 DIAGNOSIS — G43909 Migraine, unspecified, not intractable, without status migrainosus: Secondary | ICD-10-CM | POA: Diagnosis not present

## 2024-02-11 ENCOUNTER — Ambulatory Visit: Payer: 59

## 2024-02-11 DIAGNOSIS — Z0000 Encounter for general adult medical examination without abnormal findings: Secondary | ICD-10-CM | POA: Diagnosis not present

## 2024-02-11 NOTE — Patient Instructions (Signed)
Christine Mcdowell , Thank you for taking time to come for your Medicare Wellness Visit. I appreciate your ongoing commitment to your health goals. Please review the following plan we discussed and let me know if I can assist you in the future.   Referrals/Orders/Follow-Ups/Clinician Recommendations: none  This is a list of the screening recommended for you and due dates:  Health Maintenance  Topic Date Due   Pap with HPV screening  Never done   Pneumococcal Vaccination (2 of 2 - PCV) 09/14/2022   Complete foot exam   07/03/2023   Flu Shot  07/24/2023   Hemoglobin A1C  11/25/2023   Eye exam for diabetics  01/04/2024   Yearly kidney health urinalysis for diabetes  01/28/2024   Yearly kidney function blood test for diabetes  06/21/2024   Mammogram  11/07/2024   Cologuard (Stool DNA test)  12/18/2024   Medicare Annual Wellness Visit  02/10/2025   DTaP/Tdap/Td vaccine (3 - Td or Tdap) 08/23/2029   Hepatitis C Screening  Completed   HIV Screening  Completed   Zoster (Shingles) Vaccine  Completed   HPV Vaccine  Aged Out   COVID-19 Vaccine  Discontinued    Advanced directives: (In Chart) A copy of your advanced directives are scanned into your chart should your provider ever need it.  Next Medicare Annual Wellness Visit scheduled for next year: Yes  insert Preventive Care attachment Insert FALL PREVENTION attachment if needed

## 2024-02-11 NOTE — Progress Notes (Signed)
Subjective:   Christine Mcdowell is a 53 y.o. female who presents for Medicare Annual (Subsequent) preventive examination.  Visit Complete: Virtual I connected with  [name] on 02/11/24 by a audio enabled telemedicine application and verified that I am speaking with the correct person using two identifiers. Interactive audio and video telecommunications were attempted between this provider and patient, however failed, due to patient having technical difficulties OR patient did not have access to video capability.  We continued and completed visit with audio only.  Patient Location: Home  Provider Location: Home Office  I discussed the limitations of evaluation and management by telemedicine. The patient expressed understanding and agreed to proceed.  Vital Signs: Because this visit was a virtual/telehealth visit, some criteria may be missing or patient reported. Any vitals not documented were not able to be obtained and vitals that have been documented are patient reported.    Cardiac Risk Factors include: advanced age (>71men, >74 women)     Objective:    Today's Vitals   02/11/24 1116  PainSc: 7    There is no height or weight on file to calculate BMI.     02/11/2024   11:29 AM 06/22/2023    5:21 AM 04/28/2023    8:37 PM 04/06/2023   10:59 AM 08/25/2022    9:30 AM 03/21/2022    1:35 PM 03/04/2022   11:12 PM  Advanced Directives  Does Patient Have a Medical Advance Directive? Yes No Yes No Yes Yes Yes  Type of Advance Directive Out of facility DNR (pink MOST or yellow form)  Living will   Healthcare Power of [place];Living will Healthcare Power of [place];Living will  Does patient want to make changes to medical advance directive?   No - Patient declined  No - Patient declined No - Patient declined   Copy of Healthcare Power of Attorney in Chart?      No - copy requested No - copy requested  Would patient like information on creating a medical advance directive?  No  - Patient declined  No - Patient declined       Current Medications (verified) Outpatient Encounter Medications as of 02/11/2024  Medication Sig   albuterol (VENTOLIN HFA) 108 (90 Base) MCG/ACT inhaler Inhale 2 puffs into the lungs every 6 (six) hours as needed for wheezing or shortness of breath.   aspirin 81 MG chewable tablet Chew 162 mg by mouth daily. Taking 2 tablets   atorvastatin (LIPITOR) 10 MG tablet TAKE 1 TABLET BY MOUTH ONCE  DAILY   carvedilol (COREG) 25 MG tablet TAKE 1 TABLET BY MOUTH IN THE  MORNING AND 1 TABLET BY MOUTH IN THE EVENING WITH MEALS   Continuous Glucose Sensor (DEXCOM G7 SENSOR) MISC USE TO CHECK BLOOD GLUCOSE AS  DIRECTED 3 TIMES DAILY AND  CHANGE SENSOR EVERY 10 DAYS   diazepam (VALIUM) 5 MG tablet Take 1 tablet (5 mg total) by mouth 2 (two) times daily as needed for anxiety.   diclofenac Sodium (VOLTAREN) 1 % GEL apply A SMALL AMOUNT TO involved joints UP TO TWICE DAILY   ENTRESTO 97-103 MG TAKE 1 TABLET BY MOUTH TWICE  DAILY   folic acid (FOLVITE) 1 MG tablet TAKE 1 TABLET BY MOUTH EVERY MORNING   Glucagon (GVOKE HYPOPEN 2-PACK) 1 MG/0.2ML SOAJ Inject 1 mg into the skin as needed.   hydroxychloroquine (PLAQUENIL) 200 MG tablet Take 1 tablet (200 mg total) by mouth 2 (two) times daily.   insulin degludec (TRESIBA FLEXTOUCH) 100  UNIT/ML FlexTouch Pen INJECT SUBCUTANEOUSLY INTO SKIN  10 UNITS DAILY   Insulin Pen Needle (NOVOFINE PLUS PEN NEEDLE) 32G X 4 MM MISC Use with insulin pens dx code e11.65   Ipratropium-Albuterol (COMBIVENT RESPIMAT) 20-100 MCG/ACT AERS respimat Inhale 1 puff into the lungs every 6 (six) hours.   ipratropium-albuterol (DUONEB) 0.5-2.5 (3) MG/3ML SOLN Take 3 mLs by nebulization every 6 (six) hours as needed.   Magnesium 400 MG TABS Take 1 tablet by mouth daily.   meclizine (ANTIVERT) 25 MG tablet Take 1 tablet (25 mg total) by mouth 3 (three) times daily as needed for dizziness.   Methotrexate, PF, 20 MG/0.4ML SOAJ Inject 20 mg into the  skin once a week. Patient taking on Friday's   metolazone (ZAROXOLYN) 2.5 MG tablet TAKE 1 TABLET BY MOUTH AS NEEDED FOR WEIGHT GAIN OF 5 POUNDS WITHIN 3 DAYS AS DIRECTED needs appt for further refills   Multiple Vitamin (MULTIVITAMIN WITH MINERALS) TABS Take 1 tablet by mouth every morning.    ondansetron (ZOFRAN-ODT) 4 MG disintegrating tablet 4mg  ODT q4 hours prn nausea/vomit   potassium chloride (KLOR-CON M) 10 MEQ tablet TAKE 3 TABLETS BY MOUTH TWICE  DAILY   predniSONE (DELTASONE) 5 MG tablet Take by mouth.   PROAIR HFA 108 (90 Base) MCG/ACT inhaler INHALE 2 PUFFS BY MOUTH EVERY DAY AS NEEDED FOR SHORTNESS OF BREATH   promethazine-dextromethorphan (PROMETHAZINE-DM) 6.25-15 MG/5ML syrup Take 5 mLs by mouth 4 (four) times daily as needed for cough.   spironolactone (ALDACTONE) 25 MG tablet TAKE 1 TABLET BY MOUTH IN THE  MORNING AND 1 TABLET BY MOUTH IN THE EVENING   Vitamin D, Ergocalciferol, (DRISDOL) 1.25 MG (50000 UNIT) CAPS capsule Take 1 capsule (50,000 Units total) by mouth 2 (two) times a week.   dicyclomine (BENTYL) 20 MG tablet Take 1 every 8 hours as needed for abdominal cramping (Patient not taking: Reported on 02/11/2024)   Fezolinetant (VEOZAH) 45 MG TABS Take 1 tablet (45 mg total) by mouth daily.   Fluticasone Propionate (XHANCE) 93 MCG/ACT EXHU Place 1 spray into the nose in the morning and at bedtime. (Patient not taking: Reported on 02/11/2024)   methotrexate (RHEUMATREX) 2.5 MG tablet Take 20 mg by mouth every Friday.  (Patient not taking: Reported on 02/11/2024)   OLUMIANT tablet Take 2 mg by mouth daily. (Patient not taking: Reported on 02/11/2024)   predniSONE (DELTASONE) 20 MG tablet Take 2 tabs by mouth daily x 3 days (Patient not taking: Reported on 02/11/2024)   No facility-administered encounter medications on file as of 02/11/2024.    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   Discoid lupus    Fibromyalgia    History of blood transfusion "several"   "related to anemia; had some w/hysterectomy also"   Hx of cardiovascular stress test    ETT-Myoview (9/15):  No ischemia, EF 52%; NORMAL   Hx of echocardiogram    Echo (9/15):  EF 50-55%, ant HK, Gr 1 DD, mild MR, mild LAE, no effusion   Hypertension    Iron deficiency anemia  h/o iron transfusions   Lupus (systemic lupus erythematosus) (HCC)    Migraine    "a few/year" (07/03/2016)   Pneumonia 12/2015   RA (rheumatoid arthritis) (HCC)    "all over" (07/03/2016)   Sickle cell trait (HCC)    Stroke (HCC) 2014 X 1; 2015 X 2; 2016 X 1;    "right side of face more relaxed than the other; rare speech hesitation" (07/03/2016)   Vaginal Pap smear, abnormal    ASCUS; HPV   Past Surgical History:  Procedure Laterality Date   ABDOMINAL HYSTERECTOMY  2009   ABDOMINAL WOUND DEHISCENCE  2009   BUNIONECTOMY Left 06/15/2020   CARDIAC CATHETERIZATION N/A 10/11/2016   Procedure: Right/Left Heart Cath and Coronary Angiography;  Surgeon: Dolores Patty, MD;  Location: [facility] INVASIVE CV LAB;  Service: Cardiovascular;  Laterality: N/A;   DILATION AND CURETTAGE OF UTERUS  1991   HEMATOMA EVACUATION  2009   abdomen   ICD IMPLANT  01/28/2018   ICD IMPLANT N/A 01/28/2018   Procedure: ICD IMPLANT;  Surgeon: Marinus Maw, MD;  Location: [facility] INVASIVE CV LAB;  Service: Cardiovascular;  Laterality: N/A;   INCISE AND DRAIN ABCESS  2009 X 2   "abdomen after hysterectomy"   KNEE ARTHROSCOPY Right 1997   KNEE SURGERY Right    NASAL  SINUS SURGERY  10/21/2023   TUBAL LIGATION  1996   Family History  Problem Relation Age of Onset   Arthritis Mother    Heart murmur Mother    Drug abuse Mother    Allergies Mother    Heart attack Father    Cushing syndrome Father    Depression Father    Allergies Father    Dementia Paternal Grandmother    Cancer Paternal Grandfather    Diabetes Maternal Grandmother    Hypertension Maternal Grandmother    Asthma Maternal Grandmother    Heart attack Maternal Grandfather    Breast cancer Neg Hx    Social History   Socioeconomic History   Marital status: Single    Spouse name: Not on file   Number of children: Not on file   Years of education: Not on file   Highest education level: Some college, no degree  Occupational History   Not on file  Tobacco Use   Smoking status: Never   Smokeless tobacco: Never  Vaping Use   Vaping status: Never Used  Substance and Sexual Activity   Alcohol use: No   Drug use: No   Sexual activity: Not Currently    Birth control/protection: Surgical    Comment: hyst  Other Topics Concern   Not on file  Social History Narrative   Not on file   Social Drivers of Health   Financial Resource Strain: High Risk (02/11/2024)   Overall Financial Resource Strain (CARDIA)    Difficulty of Paying Living Expenses: Hard  Food Insecurity: Food Insecurity Present (02/11/2024)   Hunger Vital Sign    Worried About Running Out of Food in the Last Year: Sometimes true    Ran Out of Food in the Last Year: Sometimes true  Transportation Needs: No Transportation Needs (02/11/2024)   PRAPARE - Administrator, Civil Service (Medical): No    Lack of Transportation (Non-Medical): No  Physical Activity: Inactive (02/11/2024)   Exercise Vital Sign    Days of Exercise per Week: 0 days    Minutes of Exercise per Session: 0 min  Stress: No Stress Concern Present (02/11/2024)   [employer]  of Occupational Health - Occupational Stress Questionnaire     Feeling of Stress : Not at all  Social Connections: Moderately Integrated (02/11/2024)   Social Connection and Isolation Panel [NHANES]    Frequency of Communication with Friends and Family: More than three times a week    Frequency of Social Gatherings with Friends and Family: More than three times a week    Attends Religious Services: More than 4 times per year    Active Member of [employer] or Organizations: Yes    Attends Engineer, structural: More than 4 times per year    Marital Status: Divorced    Tobacco Counseling Counseling given: Not Answered   Clinical Intake:  Pre-visit preparation completed: Yes  Pain : 0-10 Pain Score: 7  Pain Type: Chronic pain Pain Location: Generalized Pain Descriptors / Indicators: Aching Pain Onset: More than a month ago Pain Frequency: Constant     Nutritional Risks: None Diabetes: Yes CBG done?: No Did pt. bring in CBG monitor from home?: No  How often do you need to have someone help you when you read instructions, pamphlets, or other written materials from your doctor or pharmacy?: 1 - Never  Interpreter Needed?: No  Information entered by :: NAllen LPN   Activities of Daily Living    02/11/2024   11:17 AM  In your present state of health, do you have any difficulty performing the following activities:  Hearing? 0  Vision? 1  Comment has floaters and gets blurry sometimes  Difficulty concentrating or making decisions? 0  Walking or climbing stairs? 0  Dressing or bathing? 0  Doing errands, shopping? 0  Preparing Food and eating ? N  Using the Toilet? N  In the past six months, have you accidently leaked urine? N  Do you have problems with loss of bowel control? N  Managing your Medications? N  Managing your Finances? N  Housekeeping or managing your Housekeeping? N    Patient Care Team: Arnette Felts, FNP as PCP - General (General Practice) Bensimhon, Bevelyn Buckles, MD as PCP - Cardiology (Cardiology) Marinus Maw, MD as PCP - Electrophysiology (Cardiology) Jodelle Gross, NP as Nurse Practitioner (Nurse Practitioner) Harlan Stains, RPH (Inactive) (Pharmacist) Baidland, Anderson Malta, FNP (Cardiology) Clarene Duke, Karma Lew, RN as [facility] Care Management  Indicate any recent Medical Services you may have received from other than [facility] providers in the past year (date may be approximate).     Assessment:   This is a routine wellness examination for Christine Mcdowell.  Hearing/Vision screen Hearing Screening - Comments:: Denies hearing issues Vision Screening - Comments:: Regular eye exams, [facility]   Goals Addressed             This Visit's Progress    Patient Stated       02/11/2024, wants to get better mobility       Depression Screen    02/11/2024   11:31 AM 05/26/2023   11:23 AM 01/27/2023    2:02 PM 01/07/2023   11:04 AM 09/21/2022   10:15 AM 09/06/2021    3:11 PM 10/09/2020    2:26 PM  PHQ 2/9 Scores  PHQ - 2 Score 0 0 0 0 0 0 0  PHQ- 9 Score 2 5     2     Fall Risk    02/11/2024   11:30 AM 01/27/2023    2:02 PM 01/07/2023   11:04 AM 10/22/2022   11:23 AM 10/22/2022   11:22 AM  Fall Risk   Falls in the past year? 0 0 0 0 0  Number falls in past yr: 0  0 0 0  Injury with Fall? 0  0 0 0  Risk for fall due to : Medication side effect  No Fall Risks No Fall Risks No Fall Risks  Follow up Falls prevention discussed;Falls evaluation completed  Falls evaluation completed Falls evaluation completed Falls evaluation completed    MEDICARE RISK AT HOME: Medicare Risk at Home Any stairs in or around the home?: No If so, are there any without handrails?: No Home free of loose throw rugs in walkways, pet beds, electrical cords, etc?: Yes Adequate lighting in your home to reduce risk of falls?: Yes Life alert?: No Use of a cane, walker or w/c?: No Grab bars in the bathroom?: Yes Shower chair or bench in shower?: Yes Elevated toilet seat or a handicapped toilet?:  Yes  TIMED UP AND GO:  Was the test performed?  No    Cognitive Function:        02/11/2024   11:32 AM 09/21/2022   10:18 AM 09/06/2021    3:14 PM 08/30/2020    3:35 PM 08/24/2019    3:55 PM  6CIT Screen  What Year? 0 points 0 points 0 points 0 points 0 points  What month? 0 points 0 points 0 points 0 points 0 points  What time? 0 points 0 points 0 points 3 points 0 points  Count back from 20 0 points 0 points 0 points 0 points 0 points  Months in reverse 0 points 0 points 0 points 0 points 0 points  Repeat phrase 0 points 0 points 0 points 0 points 0 points  Total Score 0 points 0 points 0 points 3 points 0 points    Immunizations Immunization History  Administered Date(s) Administered   Influenza, High Dose Seasonal PF 09/02/2018   Influenza, Seasonal, Injecte, Preservative Fre 11/06/2017, 08/24/2019   Influenza,inj,Quad PF,6+ Mos 11/06/2017, 08/24/2019, 08/30/2020, 09/14/2021   Influenza-Unspecified 08/08/2017, 11/06/2017, 08/30/2020, 09/14/2021, 09/17/2022   PFIZER(Purple Top)SARS-COV-2 Vaccination 06/16/2020, 07/07/2020, 01/01/2021   Pneumococcal Polysaccharide-23 09/14/2021   Pneumococcal-Unspecified 09/14/2021   Tdap 08/24/2019, 08/24/2019   Zoster Recombinant(Shingrix) 02/26/2022, 07/02/2022    TDAP status: Up to date  Flu Vaccine status: Due, Education has been provided regarding the importance of this vaccine. Advised may receive this vaccine at local pharmacy or Health Dept. Aware to provide a copy of the vaccination record if obtained from local pharmacy or Health Dept. Verbalized acceptance and understanding.  Pneumococcal vaccine status: Due, Education has been provided regarding the importance of this vaccine. Advised may receive this vaccine at local pharmacy or Health Dept. Aware to provide a copy of the vaccination record if obtained from local pharmacy or Health Dept. Verbalized acceptance and understanding.  Covid-19 vaccine status: Information provided on  how to obtain vaccines.   Qualifies for Shingles Vaccine? Yes   Zostavax completed No   Shingrix Completed?: Yes  Screening Tests Health Maintenance  Topic Date Due   Cervical Cancer Screening (HPV/Pap Cotest)  Never done   Pneumococcal Vaccine 70-50 Years old (2 of 2 - PCV) 09/14/2022   FOOT EXAM  07/03/2023   INFLUENZA VACCINE  07/24/2023   HEMOGLOBIN A1C  11/25/2023   OPHTHALMOLOGY EXAM  01/04/2024   Diabetic kidney evaluation - Urine ACR  01/28/2024   Diabetic kidney evaluation - eGFR measurement  06/21/2024   MAMMOGRAM  11/07/2024   Fecal DNA (Cologuard)  12/18/2024   Medicare  Annual Wellness (AWV)  02/10/2025   DTaP/Tdap/Td (3 - Td or Tdap) 08/23/2029   Hepatitis C Screening  Completed   HIV Screening  Completed   Zoster Vaccines- Shingrix  Completed   HPV VACCINES  Aged Out   COVID-19 Vaccine  Discontinued    Health Maintenance  Health Maintenance Due  Topic Date Due   Cervical Cancer Screening (HPV/Pap Cotest)  Never done   Pneumococcal Vaccine 66-53 Years old (2 of 2 - PCV) 09/14/2022   FOOT EXAM  07/03/2023   INFLUENZA VACCINE  07/24/2023   HEMOGLOBIN A1C  11/25/2023   OPHTHALMOLOGY EXAM  01/04/2024   Diabetic kidney evaluation - Urine ACR  01/28/2024    Colorectal cancer screening: Type of screening: Cologuard. Completed 12/18/2021. Repeat every 3 years  Mammogram status: Completed 02/06/2023. Repeat every year  Bone Density status: n/a  Lung Cancer Screening: (Low Dose CT Chest recommended if Age 36-80 years, 20 pack-year currently smoking OR have quit w/in 15years.) does not qualify.   Lung Cancer Screening Referral: no  Additional Screening:  Hepatitis C Screening: does qualify; Completed 08/30/2020  Vision Screening: Recommended annual ophthalmology exams for early detection of glaucoma and other disorders of the eye. Is the patient up to date with their annual eye exam?  No  Who is the provider or what is the name of the office in which the  patient attends annual eye exams? [facility] If pt is not established with a provider, would they like to be referred to a provider to establish care? No .   Dental Screening: Recommended annual dental exams for proper oral hygiene  Diabetic Foot Exam: Diabetic Foot Exam: Overdue, Pt has been advised about the importance in completing this exam. Pt is scheduled for diabetic foot exam on next appointment.  Community Resource Referral / Chronic Care Management: CRR required this visit?  No   CCM required this visit?  No     Plan:     I have personally reviewed and noted the following in the patient's chart:   Medical and social history Use of alcohol, tobacco or illicit drugs  Current medications and supplements including opioid prescriptions. Patient is not currently taking opioid prescriptions. Functional ability and status Nutritional status Physical activity Advanced directives List of other physicians Hospitalizations, surgeries, and ER visits in previous 12 months Vitals Screenings to include cognitive, depression, and falls Referrals and appointments  In addition, I have reviewed and discussed with patient certain preventive protocols, quality metrics, and best practice recommendations. A written personalized care plan for preventive services as well as general preventive health recommendations were provided to patient.     Barb Merino, LPN   4/33/2951   After Visit Summary: (MyChart) Due to this being a telephonic visit, the after visit summary with patients personalized plan was offered to patient via MyChart   Nurse Notes: none

## 2024-02-12 ENCOUNTER — Telehealth: Payer: Self-pay | Admitting: *Deleted

## 2024-02-12 NOTE — Progress Notes (Signed)
Complex Care Management Note Care Guide Note  02/12/2024 Name: Christine Mcdowell MRN: [id number] DOB: Dec 24, 1970   Complex Care Management Outreach Attempts: An unsuccessful telephone outreach was attempted today to offer the patient information about available complex care management services.  Follow Up Plan:  Additional outreach attempts will be made to offer the patient complex care management information and services.   Encounter Outcome:  No Answer  Gwenevere Ghazi  [facility]  [facility], [facility] Guide  Direct Dial: [phone]  Fax [phone]

## 2024-02-16 ENCOUNTER — Ambulatory Visit: Payer: Self-pay

## 2024-02-16 NOTE — Patient Outreach (Signed)
 Care Coordination   Follow Up Visit Note   02/16/2024 Name: Christine Mcdowell MRN: [id number] DOB: 02/23/1971  Christine Mcdowell is a 53 y.o. year old female who sees Arnette Felts, FNP for primary care. I spoke with  Christine Mcdowell by phone today.  What matters to the patients health and wellness today?  Patient would like to make a full recovery from the flu. She would like to have reduced number of sinus infections.     Goals Addressed             This Visit's Progress    To better manage chronic sinusitis   On track    Care Coordination Interventions: Evaluation of current treatment plan related to chronic sinusitis  and patient's adherence to plan as established by provider Determined patient is recovering from the flu, her grandkids and daughter in law are also recovering Reviewed and discussed patient's prescribed treatment plan per her ENT, she is taking an antibiotic as prescribed Educated patient about the potential risk for having an increase in infections while taking Methotrexate Encouraged patient to discuss any concerns further with her doctor due to having suffered from chronic sinusitis, despite having had sinus surgery  Reviewed and discussed with patient her upcoming scheduled follow up with PCP Arnette Felts FNP scheduled for 03/29/24@2 :20 PM  Discussed plans with patient for ongoing care coordination follow up and provided patient with direct contact information for nurse care coordinator    Interventions Today    Flowsheet Row Most Recent Value  Chronic Disease   Chronic disease during today's visit Other  [chronic sinusitis,  RA,  Lupus]  General Interventions   General Interventions Discussed/Reviewed General Interventions Discussed, General Interventions Reviewed, Doctor Visits, Sick Day Rules  Doctor Visits Discussed/Reviewed Doctor Visits Discussed, Doctor Visits Reviewed, Specialist, PCP  Education Interventions   Education Provided Provided  Education  Provided Verbal Education On When to see the doctor, Medication  Pharmacy Interventions   Pharmacy Dicussed/Reviewed Pharmacy Topics Discussed, Pharmacy Topics Reviewed, Medications and their functions          SDOH assessments and interventions completed:  No     Care Coordination Interventions:  Yes, provided   Follow up plan: Follow up call scheduled for 03/31/24 @09 :30 AM     Encounter Outcome:  Patient Visit Completed

## 2024-02-16 NOTE — Patient Instructions (Signed)
 Visit Information  Thank you for taking time to visit with me today. Please don't hesitate to contact me if I can be of assistance to you.   Following are the goals we discussed today:   Goals Addressed             This Visit's Progress    To better manage chronic sinusitis   On track    Care Coordination Interventions: Evaluation of current treatment plan related to chronic sinusitis  and patient's adherence to plan as established by provider Determined patient is recovering from the flu, her grandkids and daughter in law are also recovering Reviewed and discussed patient's prescribed treatment plan per her ENT, she is taking an antibiotic as prescribed Educated patient about the potential risk for having an increase in infections while taking Methotrexate Encouraged patient to discuss any concerns further with her doctor due to having suffered from chronic sinusitis, despite having had sinus surgery  Reviewed and discussed with patient her upcoming scheduled follow up with PCP Arnette Felts FNP scheduled for 03/29/24@2 :20 PM  Discussed plans with patient for ongoing care coordination follow up and provided patient with direct contact information for nurse care coordinator        Our next appointment is by telephone on 03/31/24 at 09:30 AM  Please call the care guide team at [phone] if you need to cancel or reschedule your appointment.   If you are experiencing a Mental Health or Behavioral Health Crisis or need someone to talk to, please call 1-800-273-TALK (toll free, 24 hour hotline)  Patient verbalizes understanding of instructions and care plan provided today and agrees to view in MyChart. Active MyChart status and patient understanding of how to access instructions and care plan via MyChart confirmed with patient.     [name] RN BSN CCM [place]  [facility], [facility] Nurse Care Coordinator  Direct Dial: [phone] Website:  [email or web address]

## 2024-02-17 NOTE — Progress Notes (Unsigned)
 Complex Care Management Note Care Guide Note  02/17/2024 Name: Christine Mcdowell MRN: [id number] DOB: September 26, 1971   Complex Care Management Outreach Attempts: A second unsuccessful outreach was attempted today to offer the patient with information about available complex care management services.  Follow Up Plan:  Additional outreach attempts will be made to offer the patient complex care management information and services.   Encounter Outcome:  No Answer  Gwenevere Ghazi  [facility]  [facility], [facility] Guide  Direct Dial: [phone]  Fax [phone]

## 2024-02-19 NOTE — Progress Notes (Signed)
 Complex Care Management Note Care Guide Note  02/19/2024 Name: Christine Mcdowell MRN: [id number] DOB: 07/18/1971   Complex Care Management Outreach Attempts: A third unsuccessful outreach was attempted today to offer the patient with information about available complex care management services.  Follow Up Plan:  No further outreach attempts will be made at this time. We have been unable to contact the patient to offer or enroll patient in complex care management services.  Encounter Outcome:  No Answer  Gwenevere Ghazi  [facility]  [facility], [facility] Guide  Direct Dial: [phone]  Fax [phone]

## 2024-02-25 ENCOUNTER — Ambulatory Visit (INDEPENDENT_AMBULATORY_CARE_PROVIDER_SITE_OTHER): Payer: Medicare Other

## 2024-02-25 DIAGNOSIS — I420 Dilated cardiomyopathy: Secondary | ICD-10-CM

## 2024-02-27 ENCOUNTER — Other Ambulatory Visit: Payer: Self-pay | Admitting: Nurse Practitioner

## 2024-02-27 DIAGNOSIS — J0100 Acute maxillary sinusitis, unspecified: Secondary | ICD-10-CM

## 2024-02-28 ENCOUNTER — Encounter: Payer: Self-pay | Admitting: Internal Medicine

## 2024-02-28 LAB — CUP PACEART REMOTE DEVICE CHECK
Battery Remaining Longevity: 114 mo
Battery Remaining Percentage: 78 %
Brady Statistic RV Percent Paced: 0 %
Date Time Interrogation Session: 20250305085300
HighPow Impedance: 78 Ohm
Implantable Lead Connection Status: 753985
Implantable Lead Implant Date: 20190206
Implantable Lead Location: 753860
Implantable Lead Model: 292
Implantable Lead Serial Number: 438194
Implantable Pulse Generator Implant Date: 20190206
Lead Channel Impedance Value: 557 Ohm
Lead Channel Setting Pacing Amplitude: 2.5 V
Lead Channel Setting Pacing Pulse Width: 0.4 ms
Lead Channel Setting Sensing Sensitivity: 0.5 mV
Pulse Gen Serial Number: 243572
Zone Setting Status: 755011

## 2024-03-09 ENCOUNTER — Encounter: Payer: Self-pay | Admitting: Nurse Practitioner

## 2024-03-09 ENCOUNTER — Other Ambulatory Visit: Payer: Self-pay | Admitting: Nurse Practitioner

## 2024-03-09 ENCOUNTER — Telehealth: Payer: Self-pay | Admitting: Nurse Practitioner

## 2024-03-09 DIAGNOSIS — Z79899 Other long term (current) drug therapy: Secondary | ICD-10-CM | POA: Diagnosis not present

## 2024-03-09 DIAGNOSIS — M533 Sacrococcygeal disorders, not elsewhere classified: Secondary | ICD-10-CM | POA: Diagnosis not present

## 2024-03-09 DIAGNOSIS — N951 Menopausal and female climacteric states: Secondary | ICD-10-CM

## 2024-03-09 MED ORDER — VEOZAH 45 MG PO TABS
1.0000 | ORAL_TABLET | Freq: Every day | ORAL | 3 refills | Status: DC
Start: 2024-03-09 — End: 2024-06-14

## 2024-03-09 NOTE — Telephone Encounter (Signed)
 Called patient to make aware she is to no longer take the Femring due to increase risk for cancers. We had removed the Rx from the medication list in 02/2023 however she has been able to continue getting refills. I have sent a Rx for Veozah and she is to use the samples she has unless expired and will provide with additional samples until we can see if she is approved. Explained she may have a copay of $50-$100/month. Verbalizes understanding

## 2024-03-11 DIAGNOSIS — J320 Chronic maxillary sinusitis: Secondary | ICD-10-CM | POA: Diagnosis not present

## 2024-03-16 ENCOUNTER — Telehealth: Payer: Self-pay

## 2024-03-16 ENCOUNTER — Other Ambulatory Visit: Payer: Self-pay | Admitting: Nurse Practitioner

## 2024-03-16 ENCOUNTER — Other Ambulatory Visit (HOSPITAL_COMMUNITY): Payer: Self-pay | Admitting: Internal Medicine

## 2024-03-16 NOTE — Telephone Encounter (Signed)
 PA for veozah 45mg  sent to plan.

## 2024-03-17 NOTE — Telephone Encounter (Signed)
 Overdue for follow up [facility] Mychart message sent

## 2024-03-19 ENCOUNTER — Telehealth: Payer: Self-pay

## 2024-03-19 NOTE — Telephone Encounter (Signed)
 Request Reference Number: [id number]. VEOZAH TAB 45MG  is approved through 09/16/2024. Your patient may now fill this prescription and it will be covered.

## 2024-03-22 ENCOUNTER — Encounter: Payer: Self-pay | Admitting: Nurse Practitioner

## 2024-03-22 ENCOUNTER — Other Ambulatory Visit: Payer: Self-pay

## 2024-03-22 MED ORDER — DEXCOM G7 SENSOR MISC: 2 refills | Status: DC

## 2024-03-23 ENCOUNTER — Other Ambulatory Visit: Payer: Self-pay | Admitting: Nurse Practitioner

## 2024-03-23 DIAGNOSIS — J209 Acute bronchitis, unspecified: Secondary | ICD-10-CM

## 2024-03-23 MED ORDER — IPRATROPIUM-ALBUTEROL 0.5-2.5 (3) MG/3ML IN SOLN: 3.0000 mL | Freq: Four times a day (QID) | RESPIRATORY_TRACT | 2 refills | Status: DC | PRN

## 2024-03-29 ENCOUNTER — Ambulatory Visit: Payer: 59 | Admitting: Nurse Practitioner

## 2024-03-29 ENCOUNTER — Other Ambulatory Visit (HOSPITAL_COMMUNITY)
Admission: RE | Admit: 2024-03-29 | Discharge: 2024-03-29 | Disposition: A | Discharge status: Home / Self Care | Source: Ambulatory Visit | Attending: Nurse Practitioner | Admitting: Nurse Practitioner

## 2024-03-29 VITALS — BP 140/82 | HR 77 | Temp 98.7°F | Ht 63.0 in | Wt 195.2 lb

## 2024-03-29 DIAGNOSIS — M0579 Rheumatoid arthritis with rheumatoid factor of multiple sites without organ or systems involvement: Secondary | ICD-10-CM

## 2024-03-29 DIAGNOSIS — I5022 Chronic systolic (congestive) heart failure: Secondary | ICD-10-CM

## 2024-03-29 DIAGNOSIS — R233 Spontaneous ecchymoses: Secondary | ICD-10-CM

## 2024-03-29 DIAGNOSIS — Z79899 Other long term (current) drug therapy: Secondary | ICD-10-CM | POA: Diagnosis not present

## 2024-03-29 DIAGNOSIS — E785 Hyperlipidemia, unspecified: Secondary | ICD-10-CM | POA: Diagnosis not present

## 2024-03-29 DIAGNOSIS — E559 Vitamin D deficiency, unspecified: Secondary | ICD-10-CM

## 2024-03-29 DIAGNOSIS — E1142 Type 2 diabetes mellitus with diabetic polyneuropathy: Secondary | ICD-10-CM | POA: Diagnosis not present

## 2024-03-29 DIAGNOSIS — Z1211 Encounter for screening for malignant neoplasm of colon: Secondary | ICD-10-CM | POA: Diagnosis not present

## 2024-03-29 DIAGNOSIS — Z0000 Encounter for general adult medical examination without abnormal findings: Secondary | ICD-10-CM

## 2024-03-29 DIAGNOSIS — I110 Hypertensive heart disease with heart failure: Secondary | ICD-10-CM | POA: Diagnosis not present

## 2024-03-29 DIAGNOSIS — Z95810 Presence of automatic (implantable) cardiac defibrillator: Secondary | ICD-10-CM | POA: Diagnosis not present

## 2024-03-29 DIAGNOSIS — D682 Hereditary deficiency of other clotting factors: Secondary | ICD-10-CM | POA: Diagnosis not present

## 2024-03-29 DIAGNOSIS — Z95811 Presence of heart assist device: Secondary | ICD-10-CM

## 2024-03-29 DIAGNOSIS — M545 Low back pain, unspecified: Secondary | ICD-10-CM

## 2024-03-29 DIAGNOSIS — E1169 Type 2 diabetes mellitus with other specified complication: Secondary | ICD-10-CM

## 2024-03-29 DIAGNOSIS — Z23 Encounter for immunization: Secondary | ICD-10-CM | POA: Diagnosis not present

## 2024-03-29 DIAGNOSIS — Z124 Encounter for screening for malignant neoplasm of cervix: Secondary | ICD-10-CM | POA: Diagnosis present

## 2024-03-29 LAB — POCT URINALYSIS DIP (CLINITEK)
Bilirubin, UA: NEGATIVE
Blood, UA: NEGATIVE
Glucose, UA: NEGATIVE mg/dL
Ketones, POC UA: NEGATIVE mg/dL
Leukocytes, UA: NEGATIVE
Nitrite, UA: NEGATIVE
POC PROTEIN,UA: NEGATIVE
Spec Grav, UA: 1.02 (ref 1.010–1.025)
Urobilinogen, UA: 0.2 U/dL
pH, UA: 7 (ref 5.0–8.0)

## 2024-03-29 LAB — POC HEMOCCULT BLD/STL (OFFICE/1-CARD/DIAGNOSTIC): Fecal Occult Blood, POC: NEGATIVE

## 2024-03-29 NOTE — Patient Instructions (Signed)
 Health Maintenance  Topic Date Due   Pap with HPV screening  Never done   Complete foot exam   07/03/2023   Hemoglobin A1C  11/25/2023   Eye exam for diabetics  01/04/2024   Yearly kidney health urinalysis for diabetes  01/28/2024   Yearly kidney function blood test for diabetes  06/21/2024   Flu Shot  07/23/2024   Mammogram  11/07/2024   Cologuard (Stool DNA test)  12/18/2024   Medicare Annual Wellness Visit  02/10/2025   DTaP/Tdap/Td vaccine (3 - Td or Tdap) 08/23/2029   Pneumococcal Vaccination  Completed   Hepatitis C Screening  Completed   HIV Screening  Completed   Zoster (Shingles) Vaccine  Completed   HPV Vaccine  Aged Out   COVID-19 Vaccine  Discontinued

## 2024-03-29 NOTE — Progress Notes (Addendum)
 [place], CMA,acting as a Neurosurgeon for Susanna Epley, FNP.,have documented all relevant documentation on the behalf of Susanna Epley, FNP,as directed by  Susanna Epley, FNP while in the presence of Susanna Epley, FNP.  Subjective:    Patient ID: Christine Mcdowell , female    DOB: 1971/07/23 , 53 y.o.   MRN: [id number]  Chief Complaint  Patient presents with   Annual Exam    HPI  Patient presents today for HM, Patient reports compliance with medication. Patient denies any chest pain, SOB, or headaches. Patient reports she is having back pain. Patient reports she went to the pain management doctor, she was giving a steroid shot but she is now feeling worse. She was referred by her Lupus provider. She is taking ibuprofen /naproxen  alternating. She is also taking a muscle relaxer 3-4 times a day. The pain is on the right side and not going down her leg. She is planning to go back to school next month and having to walk more on campus. She is also having trouble with allergies.   Patient reports hysterotomy in 2012. She reports she has a history of cervical cancer in 1996. There are not any notes available.   She was able having a difficult time getting her G7 dexcom sensors - the pharmacies are having a difficult time getting the sensors.  She has to reschedule her eye appt due to having the flu, she is on the wait list if their is a cancellation  Diabetes She presents for her follow-up diabetic visit. She has type 2 diabetes mellitus. Pertinent negatives for hypoglycemia include no confusion or nervousness/anxiousness. There are no diabetic associated symptoms. Pertinent negatives for diabetes include no chest pain, no polydipsia, no polyphagia and no polyuria. (She has drank juice to help increase her blood sugars. ) Diabetic complications include heart disease and peripheral neuropathy. Risk factors for coronary artery disease include sedentary lifestyle and obesity. Current diabetic treatment  includes oral agent (dual therapy) (tresiba  (10 units nightly) and metformin  - not having spikes - she is not needing the humalog  at this time). She is compliant with treatment all of the time. Her weight is stable. She is following a generally healthy diet. When asked about meal planning, she reported none. She has not had a previous visit with a dietitian. She rarely participates in exercise. (Blood sugar is ranging 90-110 when she wakes up, she is not having spikes as much in the middle of the night. ) An ACE inhibitor/angiotensin II receptor blocker is being taken. She does not see a podiatrist.Eye exam is not current.  Hypertension This is a chronic problem. The current episode started more than 1 year ago. The problem is controlled. Pertinent negatives include no anxiety or chest pain. Risk factors for coronary artery disease include obesity and sedentary lifestyle. Past treatments include diuretics and calcium  channel blockers.     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   Discoid lupus    Fibromyalgia    History of blood transfusion "several"   "related to anemia; had some w/hysterectomy also"   Hx of cardiovascular stress test    ETT-Myoview (9/15):  No ischemia, EF 52%; NORMAL   Hx of echocardiogram  Echo (9/15):  EF 50-55%, ant HK, Gr 1 DD, mild MR, mild LAE, no effusion   Hypertension    Iron deficiency anemia    h/o iron transfusions   Lupus (systemic lupus erythematosus) (HCC)    Migraine    "a few/year" (07/03/2016)   Pneumonia 12/2015   RA (rheumatoid arthritis) (HCC)    "all over" (07/03/2016)   Sickle cell trait (HCC)    Stroke (HCC) 2014 X 1; 2015 X 2; 2016 X 1;    "right side of face more relaxed than the other;  rare speech hesitation" (07/03/2016)   Vaginal Pap smear, abnormal    ASCUS; HPV     Family History  Problem Relation Age of Onset   Arthritis Mother    Heart murmur Mother    Drug abuse Mother    Allergies Mother    Heart attack Father    Cushing syndrome Father    Depression Father    Allergies Father    Dementia Paternal Grandmother    Cancer Paternal Grandfather    Diabetes Maternal Grandmother    Hypertension Maternal Grandmother    Asthma Maternal Grandmother    Heart attack Maternal Grandfather    Breast cancer Neg Hx      Current Outpatient Medications:    albuterol  (VENTOLIN  HFA) 108 (90 Base) MCG/ACT inhaler, Inhale 2 puffs into the lungs every 6 (six) hours as needed for wheezing or shortness of breath., Disp: 8 g, Rfl: 0   aspirin  81 MG chewable tablet, Chew 162 mg by mouth daily. Taking 2 tablets, Disp: , Rfl:    carvedilol  (COREG ) 25 MG tablet, TAKE 1 TABLET BY MOUTH IN THE  MORNING AND 1 TABLET BY MOUTH IN THE EVENING WITH MEALS, Disp: 200 tablet, Rfl: 2   Continuous Glucose Sensor (DEXCOM G7 SENSOR) MISC, USE TO CHECK BLOOD GLUCOSE AS  DIRECTED 3 TIMES DAILY AND  CHANGE SENSOR EVERY 10 DAYS, Disp: 10 each, Rfl: 2   diclofenac Sodium (VOLTAREN) 1 % GEL, apply A SMALL AMOUNT TO involved joints UP TO TWICE DAILY, Disp: , Rfl:    ENTRESTO  97-103 MG, TAKE 1 TABLET BY MOUTH TWICE  DAILY, Disp: 200 tablet, Rfl: 2   Fezolinetant  (VEOZAH ) 45 MG TABS, Take 1 tablet (45 mg total) by mouth daily. (Patient taking differently: Take 1 tablet by mouth daily. Patient taking brand product.), Disp: 30 tablet, Rfl: 3   folic acid  (FOLVITE ) 1 MG tablet, TAKE 1 TABLET BY MOUTH EVERY MORNING, Disp: 30 tablet, Rfl: 0   Glucagon  (GVOKE HYPOPEN  2-PACK) 1 MG/0.2ML SOAJ, Inject 1 mg into the skin as needed., Disp: 0.2 mL, Rfl: 5   hydroxychloroquine  (PLAQUENIL ) 200 MG tablet, Take 1 tablet (200 mg total) by mouth 2 (two) times daily., Disp: 180 tablet, Rfl: 0   insulin  degludec (TRESIBA   FLEXTOUCH) 100 UNIT/ML FlexTouch Pen, INJECT SUBCUTANEOUSLY INTO SKIN  10 UNITS DAILY, Disp: 15 mL, Rfl: 2   Insulin  Pen Needle (NOVOFINE PLUS PEN NEEDLE) 32G X 4 MM MISC, Use with insulin  pens dx code e11.65, Disp: 300 each, Rfl: 3   Ipratropium-Albuterol  (COMBIVENT  RESPIMAT) 20-100 MCG/ACT AERS respimat, Inhale 1 puff into the lungs every 6 (six) hours., Disp: 4 g, Rfl: 5   ipratropium-albuterol  (DUONEB) 0.5-2.5 (3) MG/3ML SOLN, Take 3 mLs by nebulization every 6 (six) hours as needed., Disp: 360 mL, Rfl: 2   Magnesium  400 MG TABS, Take 1 tablet by mouth daily., Disp: 90 tablet, Rfl: 1   meclizine  (ANTIVERT ) 25 MG tablet, Take  1 tablet (25 mg total) by mouth 3 (three) times daily as needed for dizziness. (Patient not taking: Reported on 04/01/2024), Disp: 30 tablet, Rfl: 0   Methotrexate , PF, 20 MG/0.4ML SOAJ, Inject 20 mg into the skin once a week. Patient taking on Friday's, Disp: , Rfl:    metolazone  (ZAROXOLYN ) 2.5 MG tablet, TAKE 1 TABLET BY MOUTH AS NEEDED FOR WEIGHT GAIN OF 5 POUNDS WITHIN 3 DAYS AS DIRECTED needs appt for further refills, Disp: 5 tablet, Rfl: 0   Multiple Vitamin (MULTIVITAMIN WITH MINERALS) TABS, Take 1 tablet by mouth every morning. , Disp: , Rfl:    ondansetron  (ZOFRAN -ODT) 4 MG disintegrating tablet, 4mg  ODT q4 hours prn nausea/vomit, Disp: 10 tablet, Rfl: 0   potassium chloride  (KLOR-CON  M) 10 MEQ tablet, TAKE 3 TABLETS BY MOUTH TWICE  DAILY, Disp: 600 tablet, Rfl: 2   predniSONE  (DELTASONE ) 5 MG tablet, Take 5 mg by mouth daily., Disp: , Rfl:    PROAIR  HFA 108 (90 Base) MCG/ACT inhaler, INHALE 2 PUFFS BY MOUTH EVERY DAY AS NEEDED FOR SHORTNESS OF BREATH, Disp: 18 g, Rfl: 2   spironolactone  (ALDACTONE ) 25 MG tablet, TAKE 1 TABLET BY MOUTH IN THE  MORNING AND 1 TABLET BY MOUTH IN THE EVENING, Disp: 200 tablet, Rfl: 2   Vitamin D , Ergocalciferol , (DRISDOL ) 1.25 MG (50000 UNIT) CAPS capsule, Take 1 capsule (50,000 Units total) by mouth 2 (two) times a week. (Patient not  taking: Reported on 03/31/2024), Disp: 12 capsule, Rfl: 3   atorvastatin  (LIPITOR) 40 MG tablet, Take 1 tablet (40 mg total) by mouth daily., Disp: 90 tablet, Rfl: 3   budesonide  (PULMICORT ) 0.5 MG/2ML nebulizer solution, Take 0.5 mg by nebulization 2 (two) times daily. Patient is mixing with water and using as a nasal rinse per Dr. Claudeen Crutch ENT, Disp: , Rfl:    dicyclomine  (BENTYL ) 20 MG tablet, Take 1 every 8 hours as needed for abdominal cramping (Patient not taking: Reported on 04/01/2024), Disp: 20 tablet, Rfl: 0   diphenhydrAMINE -PE-APAP 25-5-325 MG TABS, Take by mouth. (Patient not taking: Reported on 04/01/2024), Disp: , Rfl:    DULoxetine (CYMBALTA) 30 MG capsule, Take 30 mg by mouth daily., Disp: , Rfl:    Fluticasone  Propionate (XHANCE ) 93 MCG/ACT EXHU, Place 1 spray into the nose in the morning and at bedtime. (Patient not taking: Reported on 04/01/2024), Disp: 16 mL, Rfl: 2   methocarbamol (ROBAXIN) 500 MG tablet, Take 500 mg by mouth 4 (four) times daily., Disp: , Rfl:    OLUMIANT tablet, Take 2 mg by mouth daily. (Patient not taking: Reported on 04/01/2024), Disp: , Rfl:    Phenylephrine -APAP-guaiFENesin  5-325-200 MG TABS, Take by mouth 2 (two) times daily. Per Dr. Claudeen Crutch ENT (Patient not taking: Reported on 04/01/2024), Disp: , Rfl:    Tocilizumab  (ACTEMRA  ACTPEN) 162 MG/0.9ML SOAJ, Inject 162 mg into the skin once a week. Patient is taking weekly on Fridays for Lupus., Disp: , Rfl:    Allergies  Allergen Reactions   Hydrocodone -Acetaminophen  Nausea And Vomiting   Hydromorphone Nausea And Vomiting    Other reaction(s): GI Upset (intolerance), Hypertension (intolerance) Raises blood pressure  Other reaction(s): GI Upset (intolerance), Hypertension (intolerance) Raises blood pressure to stroke level   Iodinated Contrast Media Other (See Comments)    Shuts down kidneys Shuts kidney function down   Other Other (See Comments) and Anaphylaxis    Spicy foods and seasonings Skin Prep "makes  my skin peel off" Paper tape causes skin burns   Erythromycin Nausea And  Vomiting   Latex Hives   Rinvoq [Upadacitinib] Other (See Comments)   Farxiga  [Dapagliflozin ] Other (See Comments)   Tape Other (See Comments)    "Skin burns"   Mircette [Desogestrel-Ethinyl Estradiol] Nausea And Vomiting and Rash      The patient states she uses status post hysterectomy for birth control. No LMP recorded. Patient has had a hysterectomy.  Negative for: breast discharge, breast lump(s), breast pain and breast self exam. Associated symptoms include abnormal vaginal bleeding. Pertinent negatives include abnormal bleeding (hematology), anxiety, decreased libido, depression, difficulty falling sleep, dyspareunia, history of infertility, nocturia, sexual dysfunction, sleep disturbances, urinary incontinence, urinary urgency, vaginal discharge and vaginal itching. Diet low salt; heart healthy, avoids fried foods and getting fresh vegetables and freezing. She plans to have a garden this year, no fast food and if does picks healthy foods. She occassionally will eat red meat. Limits her intake of pork. Her water intake is good; 4-5 bottles a day, this is her restriction.  The patient states her exercise level is moderate - 3-4 days a week. She cares for her grandchildren and they walk and go to the park 3-4 days a week.    The patient's tobacco use is:  Social History   Tobacco Use  Smoking Status Never  Smokeless Tobacco Never  She has been exposed to passive smoke. The patient's alcohol  use is:  Social History   Substance and Sexual Activity  Alcohol  Use No  . Additional information: Last pap 2021, next one scheduled for today.    Review of Systems  Constitutional: Negative.   HENT: Negative.    Eyes: Negative.   Respiratory: Negative.    Cardiovascular: Negative.  Negative for chest pain.  Gastrointestinal: Negative.   Endocrine: Negative.  Negative for polydipsia, polyphagia and polyuria.   Genitourinary: Negative.   Musculoskeletal: Negative.   Skin: Negative.   Allergic/Immunologic: Negative.   Neurological: Negative.   Hematological: Negative.   Psychiatric/Behavioral: Negative.  Negative for confusion. The patient is not nervous/anxious.      Today's Vitals   03/29/24 1412  BP: (!) 140/82  Pulse: 77  Temp: 98.7 F (37.1 C)  TempSrc: Oral  Weight: 195 lb 3.2 oz (88.5 kg)  Height: 5\' 3"  (1.6 m)  PainSc: 8   PainLoc: Back   Body mass index is 34.58 kg/m.  Wt Readings from Last 3 Encounters:  03/31/24 197 lb 11.2 oz (89.7 kg)  03/29/24 195 lb 3.2 oz (88.5 kg)  06/22/23 190 lb (86.2 kg)     Objective:  Physical Exam Vitals and nursing note reviewed.  Constitutional:      General: She is not in acute distress.    Appearance: Normal appearance. She is well-developed. She is obese.  HENT:     Head: Normocephalic and atraumatic.     Right Ear: Hearing, tympanic membrane, ear canal and external ear normal. There is no impacted cerumen.     Left Ear: Hearing, tympanic membrane, ear canal and external ear normal. There is no impacted cerumen.     Nose: Nose normal.     Mouth/Throat:     Mouth: Mucous membranes are moist.  Eyes:     General: Lids are normal.     Extraocular Movements: Extraocular movements intact.     Conjunctiva/sclera: Conjunctivae normal.     Pupils: Pupils are equal, round, and reactive to light.     Funduscopic exam:    Right eye: No papilledema.        Left eye:  No papilledema.  Neck:     Thyroid : No thyroid  mass.     Vascular: No carotid bruit.  Cardiovascular:     Rate and Rhythm: Normal rate and regular rhythm.     Pulses: Normal pulses.     Heart sounds: Normal heart sounds. No murmur heard. Pulmonary:     Effort: Pulmonary effort is normal. No respiratory distress.     Breath sounds: Normal breath sounds. No wheezing.  Abdominal:     General: Abdomen is flat. Bowel sounds are normal. There is no distension.      Palpations: Abdomen is soft.     Tenderness: There is no abdominal tenderness.  Musculoskeletal:        General: No swelling or tenderness. Normal range of motion.     Cervical back: Full passive range of motion without pain, normal range of motion and neck supple.     Right lower leg: No edema.     Left lower leg: No edema.  Skin:    General: Skin is warm and dry.     Capillary Refill: Capillary refill takes less than 2 seconds.     Findings: Bruising (noted to thigh and arms) present.  Neurological:     General: No focal deficit present.     Mental Status: She is alert and oriented to person, place, and time.     Cranial Nerves: No cranial nerve deficit.     Sensory: No sensory deficit.     Motor: No weakness.  Psychiatric:        Mood and Affect: Mood normal.        Behavior: Behavior normal.        Thought Content: Thought content normal.        Judgment: Judgment normal.         Assessment And Plan:     Encounter for annual health examination Assessment & Plan: Behavior modifications discussed and diet history reviewed.   Pt will continue to exercise regularly and modify diet with low GI, plant based foods and decrease intake of processed foods.  Recommend intake of daily multivitamin, Vitamin D , and calcium .  Recommend mammogram and colonoscopy for preventive screenings, as well as recommend immunizations that include influenza, TDAP, and Shingles    Type 2 diabetes mellitus with peripheral neuropathy (HCC) Assessment & Plan: Hemoglobin A1c has been stable.  She has been on long-term prednisone .  Will check A1c.  Orders: -     EKG 12-Lead -     POCT URINALYSIS DIP (CLINITEK) -     Microalbumin / creatinine urine ratio -     CMP14+EGFR -     Hemoglobin A1c -     Lipid panel  Benign hypertension with coincident congestive heart failure (HCC) Assessment & Plan: Blood pressure is elevated, she is advised to take her medications as directed. She is to continue f/u  with Cardiology for her HF   Vitamin D  deficiency Assessment & Plan: Will check vitamin D  level and supplement as needed.    Also encouraged to spend 15 minutes in the sun daily.    Orders: -     VITAMIN D  25 Hydroxy (Vit-D Deficiency, Fractures)  Acute right-sided low back pain without sciatica Assessment & Plan: Continue f/u with pain clinic. She is planning to return to school and is requesting a scooter for ambulation due to her pain.    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  She is having spontaneous bruising we will check a von Willebrand lab and send results to hematologist   Presence of implantable cardioverter-defibrillator (ICD) Assessment & Plan: No episodes of activation. Continue f/u with Cardiology   Encounter for Hemoccult screening -     POC Hemoccult Bld/Stl (1-Cd Office Dx)  Encounter for Papanicolaou smear of cervix -     Cytology - PAP  Chronic systolic (congestive) heart failure (HCC) Assessment & Plan: Stable, continue f/u with Cardiology      No follow-ups on file. Patient was given opportunity to ask questions. Patient verbalized understanding of the plan and was able to repeat key elements of the plan. All questions were answered to their satisfaction.   Susanna Epley, FNP  I, Susanna Epley, FNP, have reviewed all documentation for this visit. The documentation on 03/29/24 for the exam, diagnosis, procedures, and orders are all accurate and  complete.

## 2024-03-30 LAB — MICROALBUMIN / CREATININE URINE RATIO
Creatinine, Urine: 80 mg/dL
Microalb/Creat Ratio: <4 mg/g{creat} (ref 0–29)
Microalbumin, Urine: <3 ug/mL

## 2024-03-31 ENCOUNTER — Ambulatory Visit: Payer: Self-pay

## 2024-03-31 VITALS — BP 98/69 | HR 86 | Wt 197.7 lb

## 2024-03-31 DIAGNOSIS — I509 Heart failure, unspecified: Secondary | ICD-10-CM

## 2024-03-31 DIAGNOSIS — E1169 Type 2 diabetes mellitus with other specified complication: Secondary | ICD-10-CM

## 2024-03-31 DIAGNOSIS — M329 Systemic lupus erythematosus, unspecified: Secondary | ICD-10-CM

## 2024-03-31 LAB — CMP14+EGFR
ALT: 22 IU/L (ref 0–32)
AST: 15 IU/L (ref 0–40)
Albumin: 4.4 g/dL (ref 3.8–4.9)
Alkaline Phosphatase: 45 IU/L (ref 44–121)
BUN/Creatinine Ratio: 19 (ref 9–23)
BUN: 18 mg/dL (ref 6–24)
Bilirubin Total: 0.4 mg/dL (ref 0.0–1.2)
CO2: 23 mmol/L (ref 20–29)
Calcium: 8.8 mg/dL (ref 8.7–10.2)
Chloride: 105 mmol/L (ref 96–106)
Creatinine, Ser: 0.94 mg/dL (ref 0.57–1.00)
Globulin, Total: 2 g/dL (ref 1.5–4.5)
Glucose: 67 mg/dL — ABNORMAL LOW (ref 70–99)
Potassium: 4.1 mmol/L (ref 3.5–5.2)
Sodium: 142 mmol/L (ref 134–144)
Total Protein: 6.4 g/dL (ref 6.0–8.5)
eGFR: 72 mL/min/{1.73_m2} (ref >59)

## 2024-03-31 LAB — VON WILLEBRAND PANEL
Factor VIII Activity: 249 % — ABNORMAL HIGH (ref 56–140)
Von Willebrand Ag: 236 % — ABNORMAL HIGH (ref 50–200)
Von Willebrand Factor: 254 % — ABNORMAL HIGH (ref 50–200)

## 2024-03-31 LAB — HEMOGLOBIN A1C
Est. average glucose Bld gHb Est-mCnc: 146 mg/dL
Hgb A1c MFr Bld: 6.7 % — ABNORMAL HIGH (ref 4.8–5.6)

## 2024-03-31 LAB — CBC WITH DIFFERENTIAL/PLATELET
Basophils Absolute: 0 10*3/uL (ref 0.0–0.2)
Basos: 0 %
EOS (ABSOLUTE): 0 10*3/uL (ref 0.0–0.4)
Eos: 0 %
Hematocrit: 35.4 % (ref 34.0–46.6)
Hemoglobin: 11.2 g/dL (ref 11.1–15.9)
Immature Grans (Abs): 0 10*3/uL (ref 0.0–0.1)
Immature Granulocytes: 0 %
Lymphocytes Absolute: 1 10*3/uL (ref 0.7–3.1)
Lymphs: 13 %
MCH: 28.7 pg (ref 26.6–33.0)
MCHC: 31.6 g/dL (ref 31.5–35.7)
MCV: 91 fL (ref 79–97)
Monocytes Absolute: 0.5 10*3/uL (ref 0.1–0.9)
Monocytes: 7 %
Neutrophils Absolute: 5.9 10*3/uL (ref 1.4–7.0)
Neutrophils: 80 %
Platelets: 182 10*3/uL (ref 150–450)
RBC: 3.9 x10E6/uL (ref 3.77–5.28)
RDW: 14.9 % (ref 11.7–15.4)
WBC: 7.4 10*3/uL (ref 3.4–10.8)

## 2024-03-31 LAB — LIPID PANEL
Chol/HDL Ratio: 3.2 ratio (ref 0.0–4.4)
Cholesterol, Total: 207 mg/dL — ABNORMAL HIGH (ref 100–199)
HDL: 65 mg/dL (ref >39)
LDL Chol Calc (NIH): 125 mg/dL — ABNORMAL HIGH (ref 0–99)
Triglycerides: 97 mg/dL (ref 0–149)
VLDL Cholesterol Cal: 17 mg/dL (ref 5–40)

## 2024-03-31 LAB — VITAMIN D 25 HYDROXY (VIT D DEFICIENCY, FRACTURES): Vit D, 25-Hydroxy: 41.9 ng/mL (ref 30.0–100.0)

## 2024-03-31 LAB — COAG STUDIES INTERP REPORT

## 2024-03-31 NOTE — Patient Instructions (Signed)
 Visit Information  Thank you for taking time to visit with me today. Please don't hesitate to contact me if I can be of assistance to you before our next scheduled appointment.  Our next appointment is by telephone on 04/14/24 at 09:30 AM Please call the care guide team at [phone] if you need to cancel or reschedule your appointment.   Following is a copy of your care plan:   Goals Addressed             This Visit's Progress    COMPLETED: To better manage chronic sinusitis       Care Coordination Interventions: See new goal     COMPLETED: To discuss PA for [facility]       Care Coordination Interventions: Discussed with patient she is now taking Veozah with good effectiveness for her hot flashes, she denies having any further issues with cost      VBCI RN Care Plan related to acute pain to left hip       Problems:  Care Coordination needs related to Financial Strain  and Food Insecurity  Chronic Disease Management support and education needs related to chronic pain to mid back with acute onset of persistent pain to left hip  Goal: Over the next 30 days the Patient will attend all scheduled medical appointments:   as evidenced by patient will schedule a follow up appointment with Dr. Izetta Dakin, pain management         continue to work with RN Care Manager and/or Social Worker to address care management and care coordination needs related to chronic mid back with acute onset of persistent pain to left hip as evidenced by adherence to CM Team Scheduled appointments     Work with PCP provider to help obtain a Scooter to help with mobility outdoors  Interventions:   Pain Interventions:  (Status:  New goal.) Short Term Goal Pain assessment performed Medications reviewed Reviewed provider established plan for pain management Discussed importance of adherence to all scheduled medical appointments Counseled on the importance of reporting any/all new or changed pain symptoms or  management strategies to pain management provider Advised patient to report to care team affect of pain on daily activities Reviewed with patient prescribed pharmacological and nonpharmacological pain relief strategies Advised patient to discuss worsening hip pain with provider  Patient Self-Care Activities:  Attend all scheduled provider appointments Call provider office for new concerns or questions  Take medications as prescribed    Plan:  Follow up with provider re: persistent hip pain Telephone follow up appointment with care management team member scheduled for:  04/14/24 @09 :30 AM          VBCI RN Care Plan related to chronic sinusitis       Problems:  Care Coordination needs related to Food Insecurity  and Utilities Chronic Disease Management support and education needs related to chronic sinusitis Corporate treasurer.  Goal: Over the next 60 days the Patient will attend all scheduled medical appointments: with ENT specialist as evidenced by patient will complete all scheduled appointments as directed        verbalize understanding of plan for management of chronic sinusitis as evidenced by patient will be able to verbalize her treatment plan of care as prescribed by her ENT specialist following her next scheduled visit   Interventions:   Chronic Sinusitis  (Status:  New goal.)  Long Term Goal Evaluation of current treatment plan related to  chronic sinusitis , Financial constraints related to utility  cost and Limited access to food self-management and patient's adherence to plan as established by provider. Discussed plans with patient for ongoing care management follow up and provided patient with direct contact information for care management team Evaluation of current treatment plan related to chronic sinusitis and patient's adherence to plan as established by provider Reviewed medications with patient and discussed importance of adherence to taking medications exactly  as prescribed without missed doses Care Guide referral for food insecurities; utility cost  Patient Self-Care Activities:  Attend all scheduled provider appointments Call pharmacy for medication refills 3-7 days in advance of running out of medications Call provider office for new concerns or questions  Take medications as prescribed    Plan:  Follow up with provider re: any/all symptoms suggestive of sinusitis  Telephone follow up appointment with care management team member scheduled for:  04/14/24 @09 :30 AM          VBCI RN Care Plan related to Hyperlipidemia       Problems:  Care Coordination needs related to Food Insecurity  and Utilities  Chronic Disease Management support and education needs related to HLD Financial Constraints.  Goal: Over the next 30 days the Patient will  work with the pharmacist to help determine best statin/treatment to help lower LDL  Interventions:   Hyperlipidemia Interventions:  (Status:  New goal.) Short Term Goal Medication review performed; medication list updated in electronic medical record.  Provider established cholesterol goals reviewed Reviewed role and benefits of statin for ASCVD risk reduction Reviewed importance of limiting foods high in cholesterol  Patient Self-Care Activities:  Attend all scheduled provider appointments Call pharmacy for medication refills 3-7 days in advance of running out of medications Call provider office for new concerns or questions  Take medications as prescribed   adhere to prescribed diet: low fat, low Cholesterol, low Sodium develop an exercise routine Work with pharmacist to help determine best treatment for hyperlipidemia   Plan:  Telephone follow up appointment with care management team member scheduled for:  04/17/24 @09 :30 AM             Please call 1-800-273-TALK (toll free, 24 hour hotline) if you are experiencing a Mental Health or Behavioral Health Crisis or need someone to talk  to.  Patient verbalizes understanding of instructions and care plan provided today and agrees to view in MyChart. Active MyChart status and patient understanding of how to access instructions and care plan via MyChart confirmed with patient.     [name] RN BSN CCM [place]  [facility], [facility] Nurse Care Coordinator  Direct Dial: [phone] Website: [email or web address]

## 2024-03-31 NOTE — Patient Outreach (Signed)
 Complex Care Management   Visit Note  03/31/2024  Name:  Christine Mcdowell MRN: [id number] DOB: [birth year]  Situation: Referral received for Complex Care Management related to SDOH Barriers:  Food insecurity and utility cost. Chronic pain, Chronic Sinusitis and uncontrolled Hyperlipidemia. I obtained verbal consent from patient.  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   Discoid lupus    Fibromyalgia    History of blood transfusion "several"   "related to anemia; had some w/hysterectomy also"   Hx of cardiovascular stress test    ETT-Myoview (9/15):  No ischemia, EF 52%; NORMAL   Hx of echocardiogram    Echo (9/15):  EF 50-55%, ant HK, Gr 1 DD, mild MR, mild LAE, no effusion   Hypertension    Iron deficiency anemia    h/o iron transfusions   Lupus (systemic lupus erythematosus) (HCC)    Migraine    "a few/year" (07/03/2016)   Pneumonia 12/2015   RA (rheumatoid arthritis) (HCC)    "all over" (07/03/2016)   Sickle cell trait (HCC)    Stroke (HCC) 2014 X 1; 2015 X 2; 2016 X 1;    "right side of face more relaxed than the other; rare speech hesitation" (07/03/2016)   Vaginal Pap smear, abnormal    ASCUS; HPV    Assessment: Patient Reported Symptoms:  Cognitive Alert and oriented to person, place, and time  Neurological No symptoms reported    HEENT Nasal discharge, Other: recurrent sinus infections being managed by Dr. Renata Caprice ENT  Cardiovascular Other: weight gain  Respiratory No symptoms reported    Endocrine No symptoms reported    Gastrointestinal No symptoms reported    Genitourinary Not assessed    Integumentary Bruising    Musculoskeletal Difficulty  walking    Psychosocial No symptoms reported     Vitals:   03/31/24 1007  BP: 98/69  Pulse: 86    Medications Reviewed Today     Reviewed by Riley Churches, RN (Registered Nurse) on 03/31/24 at 1033  Med List Status: <None>   Medication Order Taking? Sig Documenting Provider Last Dose Status Informant  albuterol (VENTOLIN HFA) 108 (90 Base) MCG/ACT inhaler [id number] Yes Inhale 2 puffs into the lungs every 6 (six) hours as needed for wheezing or shortness of breath. Viviano Simas, FNP Taking Active   aspirin 81 MG chewable tablet [id number] Yes Chew 162 mg by mouth daily. Taking 2 tablets [provider] Taking Active Self  atorvastatin (LIPITOR) 10 MG tablet [id number] Yes TAKE 1 TABLET BY MOUTH ONCE  DAILY Arnette Felts, FNP Taking Active   budesonide (PULMICORT) 0.5 MG/2ML nebulizer solution [id number] Yes Take 0.5 mg by nebulization 2 (two) times daily. Patient is mixing with water and using as a nasal rinse per Dr. Renata Caprice ENT [provider] Taking Active   carvedilol (COREG) 25 MG tablet [id number] Yes TAKE 1 TABLET BY MOUTH IN THE  MORNING AND 1 TABLET BY MOUTH IN THE EVENING WITH MEALS Bensimhon, Bevelyn Buckles, MD Taking Active   Continuous Glucose Sensor (DEXCOM G7 SENSOR) MISC [id number]  USE TO CHECK BLOOD GLUCOSE AS  DIRECTED 3 TIMES DAILY AND  CHANGE SENSOR  EVERY 10 DAYS Arnette Felts, FNP  Active   diazepam (VALIUM) 5 MG tablet [id number] No Take 1 tablet (5 mg total) by mouth 2 (two) times daily as needed for anxiety.  Patient not taking: Reported on 03/31/2024   Jacalyn Lefevre, MD Not Taking Active   diclofenac Sodium (VOLTAREN) 1 % GEL [id number] Yes apply A SMALL AMOUNT TO involved joints UP TO TWICE DAILY [provider] Taking Active   dicyclomine (BENTYL) 20 MG tablet [id number]  Take 1 every 8 hours as needed for abdominal cramping  Patient not taking: Reported on 02/11/2024   Bethann Berkshire, MD  Consider Medication Status and Discontinue (Completed  Course)   diphenhydrAMINE-PE-APAP 25-5-325 MG TABS [id number] Yes Take by mouth. [provider] Taking Active   ENTRESTO 97-103 MG [id number] Yes TAKE 1 TABLET BY MOUTH TWICE  DAILY Bensimhon, Bevelyn Buckles, MD Taking Active   Fezolinetant [facility]) 45 MG TABS [id number] Yes Take 1 tablet (45 mg total) by mouth daily.  Patient taking differently: Take 1 tablet by mouth daily. Patient taking brand product.   Arnette Felts, FNP Taking Active   Fluticasone Propionate Timmothy Sours) 93 MCG/ACT Truddie Coco [id number] Yes Place 1 spray into the nose in the morning and at bedtime. Arnette Felts, FNP Taking Active   folic acid (FOLVITE) 1 MG tablet [id number] Yes TAKE 1 TABLET BY MOUTH EVERY MORNING Bensimhon, Bevelyn Buckles, MD Taking Active Self  Glucagon (GVOKE HYPOPEN 2-PACK) 1 MG/0.2ML SOAJ [id number] No Inject 1 mg into the skin as needed.  Patient not taking: Reported on 03/31/2024   Arnette Felts, FNP Not Taking Active   hydroxychloroquine (PLAQUENIL) 200 MG tablet [id number] Yes Take 1 tablet (200 mg total) by mouth 2 (two) times daily. Bensimhon, Bevelyn Buckles, MD Taking Active   insulin degludec (TRESIBA FLEXTOUCH) 100 UNIT/ML FlexTouch Pen [id number] Yes INJECT SUBCUTANEOUSLY INTO SKIN  10 UNITS DAILY Arnette Felts, FNP Taking Active   Insulin Pen Needle (NOVOFINE PLUS PEN NEEDLE) 32G X 4 MM MISC [id number]  Use with insulin pens dx code e11.65 Arnette Felts, FNP  Active   Ipratropium-Albuterol (COMBIVENT RESPIMAT) 20-100 MCG/ACT AERS respimat [id number] Yes Inhale 1 puff into the lungs every 6 (six) hours. Arnette Felts, FNP Taking Active   ipratropium-albuterol (DUONEB) 0.5-2.5 (3) MG/3ML SOLN [id number] Yes Take 3 mLs by nebulization every 6 (six) hours as needed. Arnette Felts, FNP Taking Active   Magnesium 400 MG TABS [id number] Yes Take 1 tablet by mouth daily. Arnette Felts, FNP Taking Active   meclizine (ANTIVERT) 25 MG tablet [id number] No Take 1 tablet (25 mg total) by mouth 3 (three) times daily as needed for  dizziness.  Patient not taking: Reported on 03/31/2024   Jacalyn Lefevre, MD Not Taking Active   methotrexate [facility]) 2.5 MG tablet [id number]  Take 20 mg by mouth every Friday.   Patient not taking: Reported on 02/11/2024   [provider]  Consider Medication Status and Discontinue (Discontinued by provider) Self           Med Note Tiajuana Amass Oct 28, 2016 10:48 AM)    Methotrexate, PF, 20 MG/0.4ML SOAJ [id number] Yes Inject 20 mg into the skin once a week. Patient taking on Friday's [provider] Taking Active   metolazone (ZAROXOLYN) 2.5 MG tablet [id number] Yes TAKE 1 TABLET BY MOUTH AS NEEDED FOR WEIGHT GAIN OF 5 POUNDS WITHIN 3 DAYS AS DIRECTED needs appt for further refills Bensimhon, Bevelyn Buckles, MD Taking Active   Multiple Vitamin (MULTIVITAMIN WITH  MINERALS) TABS [id number] Yes Take 1 tablet by mouth every morning.  [provider] Taking Active Self  OLUMIANT tablet [id number] No Take 2 mg by mouth daily.  Patient not taking: Reported on 02/11/2024   [provider] Not Taking Active   ondansetron (ZOFRAN-ODT) 4 MG disintegrating tablet [id number] Yes 4mg  ODT q4 hours prn nausea/vomit Bethann Berkshire, MD Taking Active   Phenylephrine-APAP-guaiFENesin 5-325-200 MG TABS [id number] Yes Take by mouth 2 (two) times daily. Per Dr. Renata Caprice ENT [provider] Taking Active   potassium chloride (KLOR-CON M) 10 MEQ tablet [id number] Yes TAKE 3 TABLETS BY MOUTH TWICE  DAILY Arnette Felts, FNP Taking Active   predniSONE (DELTASONE) 20 MG tablet [id number]  Take 2 tabs by mouth daily x 3 days Arnette Felts, FNP  Consider Medication Status and Discontinue (Completed Course)   predniSONE (DELTASONE) 5 MG tablet [id number] Yes Take by mouth. [provider] Taking Active   PROAIR HFA 108 [phone] Base) MCG/ACT inhaler [id number] Yes INHALE 2 PUFFS BY MOUTH EVERY DAY AS NEEDED FOR SHORTNESS OF Adolphus Birchwood, FNP Taking Active    promethazine-dextromethorphan (PROMETHAZINE-DM) 6.25-15 MG/5ML syrup [id number] No Take 5 mLs by mouth 4 (four) times daily as needed for cough.  Patient not taking: Reported on 03/31/2024   Arnette Felts, FNP Not Taking Active   spironolactone (ALDACTONE) 25 MG tablet [id number] Yes TAKE 1 TABLET BY MOUTH IN THE  MORNING AND 1 TABLET BY MOUTH IN THE EVENING Bensimhon, Bevelyn Buckles, MD Taking Active   Tocilizumab (ACTEMRA ACTPEN) 162 MG/0.9ML SOAJ [id number] Yes Inject 162 mg into the skin once a week. Patient is taking weekly on Fridays for Lupus. [provider] Taking Active   Vitamin D, Ergocalciferol, (DRISDOL) 1.25 MG (50000 UNIT) CAPS capsule [id number] No Take 1 capsule (50,000 Units total) by mouth 2 (two) times a week.  Patient not taking: Reported on 03/31/2024   Arnette Felts, FNP Not Taking Active             Recommendation:   PCP Follow-up Acute PCP follow-up Rx for Scooter . Referral to: [facility] pharmacist to assist with disease management of Hyperlipidemia; Care Guide for resources to help with food insecurities and utility cost   Follow Up Plan:   Telephone follow up appointment date/time:  04/14/24 @09 :30 AM  [name] RN BSN CCM [place]  [facility], [facility] Health Nurse Care Coordinator  Direct Dial: [phone] Website: [email or web address]

## 2024-04-01 ENCOUNTER — Encounter: Payer: Self-pay | Admitting: Nurse Practitioner

## 2024-04-01 ENCOUNTER — Telehealth: Payer: Self-pay | Admitting: Pharmacist

## 2024-04-01 ENCOUNTER — Telehealth: Payer: Self-pay

## 2024-04-01 DIAGNOSIS — E785 Hyperlipidemia, unspecified: Secondary | ICD-10-CM

## 2024-04-01 LAB — CYTOLOGY - PAP: Diagnosis: NEGATIVE

## 2024-04-01 MED ORDER — ATORVASTATIN CALCIUM 40 MG PO TABS: 40.0000 mg | ORAL_TABLET | Freq: Every day | ORAL | 3 refills | Status: AC

## 2024-04-01 NOTE — Progress Notes (Signed)
   Telephone encounter was:  Unsuccessful.  04/01/2024 Name: Christine Mcdowell MRN: [id number] DOB: [birth year]  Unsuccessful outbound call made today to assist with:  Food Insecurity, Financial Difficulties related to Financial strain, and Principal Financial Attempt:  1st Attempt  Unable to leave a message   Lenard Forth [facility] Health  [facility] Guide, Phone: [phone] Fax: [phone] Website: [email or web address]

## 2024-04-01 NOTE — Progress Notes (Signed)
 04/01/2024 Name: Christine Mcdowell MRN: [id number] DOB: 02-11-1971  Chief Complaint  Patient presents with   Medication Management    Hyperlipidemia     Christine Mcdowell is a 53 y.o. year old female who presented for a telephone visit. She has multiple medical conditions including but not limited to:  asthma, type 2 diabetes, congested heart failure, lupus, and rheumatoid arthritis.     They were referred to the pharmacist by their Case Management Team  for assistance in managing hyperlipidemia. [facility] Nurse Care Manager, [name], RN referred the Patient to have her lipid therapy adjusted to get her LDL at the goal of <70 mg/dl.   Subjective:  Care Team: Primary Care Provider: Arnette Felts, FNP ;     Medication Access/Adherence  Current Pharmacy:  [facility] - [place], [place] - [address] [place] [address] Phone: [phone] Fax: [phone]  [facility] #[id number] [place], [place] - [address] AT [facility] [address] [place] [address] Phone: [phone] Fax: [phone]   Patient reports affordability concerns with their medications: No  Patient reports access/transportation concerns to their pharmacy: No  Patient reports adherence concerns with their medications:  No      Diabetes:  Current medications:  Tresbia 10 units dailiy   Hyperlipidemia/ASCVD Risk Reduction  Current lipid lowering medications:  Atrovastatin 10 mg 1 tablet daily  Antiplatelet regimen:  Aspirin 81 mg 1 tablet daily   Clinical ASCVD: Yes  The ASCVD Risk score (Arnett DK, et al., 2019) failed to calculate for the following reasons:   Risk score cannot be calculated because patient has a medical history suggesting prior/existing ASCVD    Objective:  Lab Results  Component Value Date   HGBA1C 6.7 (H) 03/29/2024    Lab Results  Component Value Date   CREATININE 0.94 03/29/2024    BUN 18 03/29/2024   NA 142 03/29/2024   K 4.1 03/29/2024   CL 105 03/29/2024   CO2 23 03/29/2024    Lab Results  Component Value Date   CHOL 207 (H) 03/29/2024   HDL 65 03/29/2024   LDLCALC 125 (H) 03/29/2024   TRIG 97 03/29/2024   CHOLHDL 3.2 03/29/2024    Medications Reviewed Today     Reviewed by Beecher Mcardle, RPH (Pharmacist) on 04/01/24 at 1426  Med List Status: <None>   Medication Order Taking? Sig Documenting Provider Last Dose Status Informant  albuterol (VENTOLIN HFA) 108 (90 Base) MCG/ACT inhaler [id number]  Inhale 2 puffs into the lungs every 6 (six) hours as needed for wheezing or shortness of breath. Viviano Simas, FNP  Active   aspirin 81 MG chewable tablet [id number] Yes Chew 162 mg by mouth daily. Taking 2 tablets [provider] Taking Active Self  atorvastatin (LIPITOR) 10 MG tablet [id number] Yes TAKE 1 TABLET BY MOUTH ONCE  DAILY Arnette Felts, FNP Taking Active   budesonide (PULMICORT) 0.5 MG/2ML nebulizer solution [id number] Yes Take 0.5 mg by nebulization 2 (two) times daily. Patient is mixing with water and using as a nasal rinse per Dr. Renata Caprice ENT [provider] Taking Active   carvedilol (COREG) 25 MG tablet [id number] Yes TAKE 1 TABLET BY MOUTH IN THE  MORNING AND 1 TABLET BY MOUTH IN THE EVENING WITH MEALS Bensimhon, Bevelyn Buckles, MD Taking Active   Continuous Glucose Sensor (DEXCOM G7 SENSOR) MISC [id number] Yes USE TO CHECK BLOOD GLUCOSE AS  DIRECTED 3 TIMES DAILY AND  CHANGE SENSOR EVERY 10 DAYS Arnette Felts, FNP Taking Active   diclofenac Sodium (VOLTAREN) 1 % GEL [id number] Yes apply A SMALL AMOUNT TO involved joints UP TO TWICE DAILY [provider] Taking Active   dicyclomine (BENTYL) 20 MG tablet [id number] No Take 1 every 8 hours as needed for abdominal cramping  Patient not taking: Reported on 04/01/2024   Bethann Berkshire, MD Not Taking Active   diphenhydrAMINE-PE-APAP 25-5-325 MG TABS [id number] No Take by mouth.  Patient not  taking: Reported on 04/01/2024   [provider] Not Taking Active   DULoxetine (CYMBALTA) 30 MG capsule [id number] Yes Take 30 mg by mouth daily. [provider] Taking Active   ENTRESTO 97-103 MG [id number] Yes TAKE 1 TABLET BY MOUTH TWICE  DAILY Bensimhon, Bevelyn Buckles, MD Taking Active   Fezolinetant [facility]) 45 MG TABS [id number] Yes Take 1 tablet (45 mg total) by mouth daily.  Patient taking differently: Take 1 tablet by mouth daily. Patient taking brand product.   Arnette Felts, FNP Taking Active   Fluticasone Propionate Timmothy Sours) 93 MCG/ACT Truddie Coco [id number] No Place 1 spray into the nose in the morning and at bedtime.  Patient not taking: Reported on 04/01/2024   Arnette Felts, FNP Not Taking Active   folic acid (FOLVITE) 1 MG tablet [id number] Yes TAKE 1 TABLET BY MOUTH EVERY MORNING Bensimhon, Bevelyn Buckles, MD Taking Active Self  Glucagon (GVOKE HYPOPEN 2-PACK) 1 MG/0.2ML SOAJ [id number] Yes Inject 1 mg into the skin as needed. Arnette Felts, FNP Taking Active   hydroxychloroquine (PLAQUENIL) 200 MG tablet [id number] Yes Take 1 tablet (200 mg total) by mouth 2 (two) times daily. Bensimhon, Bevelyn Buckles, MD Taking Active   insulin degludec (TRESIBA FLEXTOUCH) 100 UNIT/ML FlexTouch Pen [id number] Yes INJECT SUBCUTANEOUSLY INTO SKIN  10 UNITS DAILY Arnette Felts, FNP Taking Active   Insulin Pen Needle (NOVOFINE PLUS PEN NEEDLE) 32G X 4 MM MISC [id number] Yes Use with insulin pens dx code e11.65 Arnette Felts, FNP Taking Active   Ipratropium-Albuterol (COMBIVENT RESPIMAT) 20-100 MCG/ACT AERS respimat [id number]  Inhale 1 puff into the lungs every 6 (six) hours. Arnette Felts, FNP  Active   ipratropium-albuterol (DUONEB) 0.5-2.5 (3) MG/3ML SOLN [id number] Yes Take 3 mLs by nebulization every 6 (six) hours as needed. Arnette Felts, FNP Taking Active   Magnesium 400 MG TABS [id number] Yes Take 1 tablet by mouth daily. Arnette Felts, FNP Taking Active   meclizine (ANTIVERT) 25 MG tablet [id number] No Take 1  tablet (25 mg total) by mouth 3 (three) times daily as needed for dizziness.  Patient not taking: Reported on 04/01/2024   Jacalyn Lefevre, MD Not Taking Active   methocarbamol (ROBAXIN) 500 MG tablet [id number] Yes Take 500 mg by mouth 4 (four) times daily. [provider] Taking Active   Methotrexate, PF, 20 MG/0.4ML SOAJ [id number] Yes Inject 20 mg into the skin once a week. Patient taking on Friday's [provider] Taking Active   metolazone (ZAROXOLYN) 2.5 MG tablet [id number] Yes TAKE 1 TABLET BY MOUTH AS NEEDED FOR WEIGHT GAIN OF 5 POUNDS WITHIN 3 DAYS AS DIRECTED needs appt for further refills Bensimhon, Bevelyn Buckles, MD Taking Active   Multiple Vitamin (MULTIVITAMIN WITH MINERALS) TABS [id number] Yes Take 1 tablet by mouth every morning.  [provider] Taking Active Self  OLUMIANT tablet [id number] No Take 2 mg by mouth daily.  Patient not taking: Reported on 04/01/2024   [provider] Not Taking Active   ondansetron (  ZOFRAN-ODT) 4 MG disintegrating tablet [id number] Yes 4mg  ODT q4 hours prn nausea/vomit Bethann Berkshire, MD Taking Active   Phenylephrine-APAP-guaiFENesin 5-325-200 MG TABS [id number] No Take by mouth 2 (two) times daily. Per Dr. Renata Caprice ENT  Patient not taking: Reported on 04/01/2024   [provider] Not Taking Active   potassium chloride (KLOR-CON M) 10 MEQ tablet [id number] Yes TAKE 3 TABLETS BY MOUTH TWICE  DAILY Arnette Felts, FNP Taking Active   predniSONE (DELTASONE) 5 MG tablet [id number] Yes Take 5 mg by mouth daily. [provider] Taking Active   PROAIR HFA 108 [phone] Base) MCG/ACT inhaler [id number] Yes INHALE 2 PUFFS BY MOUTH EVERY DAY AS NEEDED FOR SHORTNESS OF Adolphus Birchwood, FNP Taking Active   spironolactone (ALDACTONE) 25 MG tablet [id number] Yes TAKE 1 TABLET BY MOUTH IN THE  MORNING AND 1 TABLET BY MOUTH IN THE EVENING Bensimhon, Bevelyn Buckles, MD Taking Active   Tocilizumab (ACTEMRA ACTPEN) 162 MG/0.9ML SOAJ [id number]  Yes Inject 162 mg into the skin once a week. Patient is taking weekly on Fridays for Lupus. [provider] Taking Active   Vitamin D, Ergocalciferol, (DRISDOL) 1.25 MG (50000 UNIT) CAPS capsule [id number]  Take 1 capsule (50,000 Units total) by mouth 2 (two) times a week.  Patient not taking: Reported on 03/31/2024   Arnette Felts, FNP  Active               Assessment/Plan:   Diabetes: - Currently controlled 6.7% - - Recommend to continue current therapy  - - Recommend turn on sharing with her Dexcom 7.  I added her to the [facility] clinic Clarity profile   Hyperlipidemia/ASCVD Risk Reduction: - Currently uncontrolled. LDL 125 mg/dl goal <41 - - Recommend to increase atorvastatin dose  from 10?mg to 40?mg daily to escalate from moderate- to high-intensity statin therapy, aiming for further LDL reduction and improved cardiovascular risk reduction.    Follow Up Plan:    Sent Atorvastatin 40 mg 1 tablet daily prescription to the [facility] in [place], [place] \(Prescription sent for co-signature to the PCP). Will follow up patient to make sure she is adjusting to the dose in 2 weeks. Follow up on labs   Beecher Mcardle, PharmD, [facility] Clinical Pharmacist [phone]

## 2024-04-01 NOTE — Telephone Encounter (Addendum)
-----   Message from Nurse Lawanna Kobus L sent at 03/31/2024 11:47 AM EDT ----- Regarding: new referral Garnetta Buddy, I sent a referral for you to work with this patient to help determine best statin to help lower her LDL. She is a very complex patient with hx of dm2, CHF, Lupus, RA, Hyperlipidemia not at goal for more than a year. She is currently taking atorvastatin 10 mg every day, reports adherence to her taking her medication and following a low fat diet. LDL is up in the 120's. I let Janece know that I sent the referral to see if you can help determine the best statin/treatment to help get her LDL to goal. Please let me know if you have any questions!  Thanks, [employer]

## 2024-04-02 ENCOUNTER — Telehealth: Payer: Self-pay

## 2024-04-02 NOTE — Progress Notes (Signed)
   Telephone encounter was:  Successful.  Complex Care Management Note Care Guide Note  04/02/2024 Name: Christine Mcdowell MRN: [id number] DOB: July 20, 1971  Christine Mcdowell is a 53 y.o. year old female who is a primary care patient of Arnette Felts, FNP . The community resource team was consulted for assistance with Food Insecurity and Financial Difficulties related to financial strain  SDOH screenings and interventions completed:  Yes  Social Drivers of Health From This Encounter   Food Insecurity: Food Insecurity Present (04/02/2024)   Hunger Vital Sign    Worried About Running Out of Food in the Last Year: Often true    Ran Out of Food in the Last Year: Often true  Financial Resource Strain: High Risk (04/02/2024)   Overall Financial Resource Strain (CARDIA)    Difficulty of Paying Living Expenses: Very hard  Utilities: At Risk (04/02/2024)   Utilities    Threatened with loss of utilities: Yes    SDOH Interventions Today    Flowsheet Row Most Recent Value  SDOH Interventions   Food Insecurity Interventions Community Resources Provided, [id number] Referral  Utilities Interventions Community Resources Provided, [id number] Referral  Financial Strain Interventions [id number] Referral, Community Resources Provided        Care guide performed the following interventions: Patient provided with information about care guide support team and interviewed to confirm resource needs.Pt is having financial strain and cant afford to pay all of her bills and buy food. Patient has requested I email and mail resources to her email:[email or web address] . I have added referrals to [facility] 360 as well   Follow Up Plan:  No further follow up planned at this time. The patient has been provided with needed resources.  Encounter Outcome:  Patient Visit Completed    Lenard Forth [facility]  [facility] Guide, Phone: [phone] Fax: [phone] Website:  [email or web address]

## 2024-04-06 DIAGNOSIS — Z0000 Encounter for general adult medical examination without abnormal findings: Secondary | ICD-10-CM | POA: Insufficient documentation

## 2024-04-06 DIAGNOSIS — I110 Hypertensive heart disease with heart failure: Secondary | ICD-10-CM | POA: Insufficient documentation

## 2024-04-06 DIAGNOSIS — M545 Low back pain, unspecified: Secondary | ICD-10-CM | POA: Insufficient documentation

## 2024-04-06 NOTE — Assessment & Plan Note (Signed)
 She reports previous history.  She is having spontaneous bruising we will check a von Willebrand lab and send results to hematologist

## 2024-04-06 NOTE — Assessment & Plan Note (Addendum)
 Will check vitamin D level and supplement as needed.    Also encouraged to spend 15 minutes in the sun daily.

## 2024-04-06 NOTE — Assessment & Plan Note (Addendum)
 Blood pressure is elevated, she is advised to take her medications as directed. She is to continue f/u with Cardiology for her HF

## 2024-04-06 NOTE — Assessment & Plan Note (Signed)
 Behavior modifications discussed and diet history reviewed.   Pt will continue to exercise regularly and modify diet with low GI, plant based foods and decrease intake of processed foods.  Recommend intake of daily multivitamin, Vitamin D, and calcium.  Recommend mammogram and colonoscopy for preventive screenings, as well as recommend immunizations that include influenza, TDAP, and Shingles

## 2024-04-06 NOTE — Assessment & Plan Note (Signed)
 Hemoglobin A1c has been stable.  She has been on long-term prednisone.  Will check A1c.

## 2024-04-06 NOTE — Assessment & Plan Note (Signed)
 Continue follow-up with rheumatology

## 2024-04-07 DIAGNOSIS — E1165 Type 2 diabetes mellitus with hyperglycemia: Secondary | ICD-10-CM | POA: Diagnosis not present

## 2024-04-12 ENCOUNTER — Encounter: Payer: Self-pay | Admitting: Nurse Practitioner

## 2024-04-13 DIAGNOSIS — E119 Type 2 diabetes mellitus without complications: Secondary | ICD-10-CM | POA: Diagnosis not present

## 2024-04-13 DIAGNOSIS — Z794 Long term (current) use of insulin: Secondary | ICD-10-CM | POA: Diagnosis not present

## 2024-04-13 DIAGNOSIS — M329 Systemic lupus erythematosus, unspecified: Secondary | ICD-10-CM | POA: Diagnosis not present

## 2024-04-13 DIAGNOSIS — D688 Other specified coagulation defects: Secondary | ICD-10-CM | POA: Diagnosis not present

## 2024-04-13 DIAGNOSIS — Z9889 Other specified postprocedural states: Secondary | ICD-10-CM | POA: Diagnosis not present

## 2024-04-13 DIAGNOSIS — J320 Chronic maxillary sinusitis: Secondary | ICD-10-CM | POA: Diagnosis not present

## 2024-04-13 NOTE — Progress Notes (Signed)
 Remote ICD transmission.

## 2024-04-13 NOTE — Addendum Note (Signed)
 Addended by: Lott Rouleau A on: 04/13/2024 10:20 AM   Modules accepted: Orders

## 2024-04-14 ENCOUNTER — Telehealth: Payer: Self-pay

## 2024-04-14 DIAGNOSIS — Z23 Encounter for immunization: Secondary | ICD-10-CM | POA: Insufficient documentation

## 2024-04-14 DIAGNOSIS — R233 Spontaneous ecchymoses: Secondary | ICD-10-CM | POA: Insufficient documentation

## 2024-04-14 NOTE — Progress Notes (Signed)
 Cancel.

## 2024-04-14 NOTE — Assessment & Plan Note (Signed)
 No episodes of activation. Continue f/u with Cardiology

## 2024-04-14 NOTE — Progress Notes (Signed)
 I have updated, let me know if I need to do anything else.

## 2024-04-14 NOTE — Assessment & Plan Note (Signed)
 Continue f/u with pain clinic. She is planning to return to school and is requesting a scooter for ambulation due to her pain.

## 2024-04-14 NOTE — Assessment & Plan Note (Signed)
Stable, continue f/u with Cardiology

## 2024-04-15 DIAGNOSIS — M0579 Rheumatoid arthritis with rheumatoid factor of multiple sites without organ or systems involvement: Secondary | ICD-10-CM | POA: Diagnosis not present

## 2024-04-15 DIAGNOSIS — M329 Systemic lupus erythematosus, unspecified: Secondary | ICD-10-CM | POA: Diagnosis not present

## 2024-04-15 DIAGNOSIS — Z79899 Other long term (current) drug therapy: Secondary | ICD-10-CM | POA: Diagnosis not present

## 2024-04-20 ENCOUNTER — Encounter: Payer: Self-pay | Admitting: Pediatrics

## 2024-05-03 ENCOUNTER — Other Ambulatory Visit: Payer: Self-pay | Admitting: Nurse Practitioner

## 2024-05-05 ENCOUNTER — Encounter: Payer: Self-pay | Admitting: Nurse Practitioner

## 2024-05-17 NOTE — Progress Notes (Signed)
 Patient did not show for appointment. Note left for templating purposes only    [facility] Note   Date:  05/17/2024   ID:  Christine Mcdowell, DOB 1971/04/25, MRN [id number]  Location: Home  Provider location: [facility] Type of Visit: Established patient  PCP:  Susanna Epley, FNP  EP: Dr. Carolynne Citron Rheum: Dr. 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    Admitted in October 2017 with increased dyspnea and volume overload. Diuresed with IV lasix  and transitioned to lasix  40 mg bid. L/RHC normal filling pressures, EF ~20%, and normal coronaries. Discharge weight 184 pounds.   Cardiac MRI in 02/2015 showed EF 46%. No LGE.   Echo 8/21: EF ~ 40%-45%, Grade II DD, moderate MR.  Echo 10/09/21 EF 45-50% mild MR   CPX 9/19 with relaltively normal spirometry. Mild HF limitation.   Echo 4/23 EF 45%  Has been following in Rheum Clinic with [facility] currently on Plaquenil  200 mg BID, Methotrexate  20mg  weekly and tocilizumab  weekly. Back pain symptoms felt not to be part of her inflammatory arthritis and also follows with Pain Clinic.    We have not seen her in AHF Clinic since 4/24 when she presented for an acute visit.    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   Discoid lupus    Fibromyalgia    History of blood transfusion "several"   "related to anemia; had some  w/hysterectomy also"   Hx of cardiovascular stress test    ETT-Myoview (9/15):  No ischemia, EF 52%; NORMAL   Hx of echocardiogram    Echo (9/15):  EF 50-55%, ant HK, Gr 1 DD, mild MR, mild LAE, no effusion   Hypertension    Iron deficiency anemia    h/o iron transfusions   Lupus (systemic lupus erythematosus) (HCC)    Migraine    "a few/year" (07/03/2016)   Pneumonia 12/2015   RA (rheumatoid arthritis) (HCC)    "all over" (07/03/2016)   Sickle cell trait (HCC)    Stroke (HCC) 2014 X 1; 2015 X 2; 2016 X 1;    "right side of face more relaxed than the other; rare speech hesitation" (07/03/2016)   Vaginal Pap smear, abnormal    ASCUS; HPV   Past Surgical History:  Procedure Laterality Date   ABDOMINAL HYSTERECTOMY  2009   ABDOMINAL WOUND DEHISCENCE  2009   BUNIONECTOMY Left 06/15/2020   CARDIAC CATHETERIZATION N/A 10/11/2016   Procedure: Right/Left Heart Cath and Coronary Angiography;  Surgeon: Mardell Shade, MD;  Location: [facility] INVASIVE CV LAB;  Service: Cardiovascular;  Laterality: N/A;   DILATION AND CURETTAGE OF UTERUS  1991   HEMATOMA EVACUATION  2009   abdomen   ICD IMPLANT  01/28/2018   ICD IMPLANT N/A 01/28/2018   Procedure: ICD IMPLANT;  Surgeon: Tammie Fall, MD;  Location: [facility] INVASIVE CV LAB;  Service: Cardiovascular;  Laterality: N/A;  INCISE AND DRAIN ABCESS  2009 X 2   "abdomen after hysterectomy"   KNEE ARTHROSCOPY Right 1997   KNEE SURGERY Right    NASAL SINUS SURGERY  10/21/2023   TUBAL LIGATION  1996   Current Outpatient Medications  Medication Sig Dispense Refill   albuterol  (VENTOLIN  HFA) 108 (90 Base) MCG/ACT inhaler Inhale 2 puffs into the lungs every 6 (six) hours as needed for wheezing or shortness of breath. 8 g 0   aspirin  81 MG chewable tablet Chew 162 mg by mouth daily. Taking 2 tablets     atorvastatin  (LIPITOR) 40 MG tablet Take 1 tablet (40 mg total) by mouth daily. 90 tablet 3   budesonide  (PULMICORT ) 0.5 MG/2ML nebulizer solution Take 0.5  mg by nebulization 2 (two) times daily. Patient is mixing with water and using as a nasal rinse per Dr. Claudeen Crutch ENT     carvedilol  (COREG ) 25 MG tablet TAKE 1 TABLET BY MOUTH IN THE  MORNING AND 1 TABLET BY MOUTH IN THE EVENING WITH MEALS 200 tablet 2   Continuous Glucose Sensor (DEXCOM G7 SENSOR) MISC USE TO CHECK BLOOD GLUCOSE AS  DIRECTED 3 TIMES DAILY AND  CHANGE SENSOR EVERY 10 DAYS 10 each 2   diclofenac Sodium (VOLTAREN) 1 % GEL apply A SMALL AMOUNT TO involved joints UP TO TWICE DAILY     DULoxetine (CYMBALTA) 30 MG capsule Take 30 mg by mouth daily.     ENTRESTO  97-103 MG TAKE 1 TABLET BY MOUTH TWICE  DAILY 200 tablet 2   Fezolinetant  (VEOZAH ) 45 MG TABS Take 1 tablet (45 mg total) by mouth daily. (Patient taking differently: Take 1 tablet by mouth daily. Patient taking brand product.) 30 tablet 3   Fluticasone  Propionate (XHANCE ) 93 MCG/ACT EXHU Place 1 spray into the nose in the morning and at bedtime. (Patient not taking: Reported on 04/01/2024) 16 mL 2   folic acid  (FOLVITE ) 1 MG tablet TAKE 1 TABLET BY MOUTH EVERY MORNING 30 tablet 0   Glucagon  (GVOKE HYPOPEN  2-PACK) 1 MG/0.2ML SOAJ Inject 1 mg into the skin as needed. 0.2 mL 5   hydroxychloroquine  (PLAQUENIL ) 200 MG tablet Take 1 tablet (200 mg total) by mouth 2 (two) times daily. 180 tablet 0   insulin  degludec (TRESIBA  FLEXTOUCH) 100 UNIT/ML FlexTouch Pen INJECT SUBCUTANEOUSLY INTO SKIN  10 UNITS DAILY 15 mL 2   Insulin  Pen Needle (NOVOFINE PLUS PEN NEEDLE) 32G X 4 MM MISC Use with insulin  pens dx code e11.65 300 each 3   Ipratropium-Albuterol  (COMBIVENT  RESPIMAT) 20-100 MCG/ACT AERS respimat Inhale 1 puff into the lungs every 6 (six) hours. 4 g 5   ipratropium-albuterol  (DUONEB) 0.5-2.5 (3) MG/3ML SOLN Take 3 mLs by nebulization every 6 (six) hours as needed. 360 mL 2   Magnesium  400 MG TABS Take 1 tablet by mouth daily. 90 tablet 1   meclizine  (ANTIVERT ) 25 MG tablet Take 1 tablet (25 mg total) by mouth 3 (three) times daily as  needed for dizziness. (Patient not taking: Reported on 04/01/2024) 30 tablet 0   methocarbamol (ROBAXIN) 500 MG tablet Take 500 mg by mouth 4 (four) times daily.     Methotrexate , PF, 20 MG/0.4ML SOAJ Inject 20 mg into the skin once a week. Patient taking on Friday's     metolazone  (ZAROXOLYN ) 2.5 MG tablet TAKE 1 TABLET BY MOUTH AS NEEDED FOR WEIGHT GAIN OF 5 POUNDS WITHIN 3 DAYS AS DIRECTED needs appt for further refills 5 tablet 0   Multiple Vitamin (MULTIVITAMIN WITH MINERALS)  TABS Take 1 tablet by mouth every morning.      OLUMIANT tablet Take 2 mg by mouth daily. (Patient not taking: Reported on 04/01/2024)     ondansetron  (ZOFRAN -ODT) 4 MG disintegrating tablet 4mg  ODT q4 hours prn nausea/vomit 10 tablet 0   Phenylephrine -APAP-guaiFENesin  5-325-200 MG TABS Take by mouth 2 (two) times daily. Per Dr. Claudeen Crutch ENT (Patient not taking: Reported on 04/01/2024)     potassium chloride  (KLOR-CON  M) 10 MEQ tablet TAKE 3 TABLETS BY MOUTH TWICE  DAILY 600 tablet 2   predniSONE  (DELTASONE ) 5 MG tablet Take 5 mg by mouth daily.     PROAIR  HFA 108 (90 Base) MCG/ACT inhaler INHALE 2 PUFFS BY MOUTH EVERY DAY AS NEEDED FOR SHORTNESS OF BREATH 18 g 2   spironolactone  (ALDACTONE ) 25 MG tablet TAKE 1 TABLET BY MOUTH IN THE  MORNING AND 1 TABLET BY MOUTH IN THE EVENING 200 tablet 2   Tocilizumab  (ACTEMRA  ACTPEN) 162 MG/0.9ML SOAJ Inject 162 mg into the skin once a week. Patient is taking weekly on Fridays for Lupus.     Vitamin D , Ergocalciferol , (DRISDOL ) 1.25 MG (50000 UNIT) CAPS capsule Take 1 capsule (50,000 Units total) by mouth 2 (two) times a week. (Patient not taking: Reported on 03/31/2024) 12 capsule 3   No current facility-administered medications for this encounter.   Allergies:   Hydrocodone -acetaminophen , Hydromorphone, Iodinated contrast media, Other, Erythromycin, Latex, Rinvoq [upadacitinib], Farxiga  [dapagliflozin ], Tape, and Mircette [desogestrel-ethinyl estradiol]   Social History:  The patient   reports that she has never smoked. She has never used smokeless tobacco. She reports that she does not drink alcohol  and does not use drugs.   Family History:  The patient's family history includes Allergies in her father and mother; Arthritis in her mother; Asthma in her maternal grandmother; Cancer in her paternal grandfather; Cushing syndrome in her father; Dementia in her paternal grandmother; Depression in her father; Diabetes in her maternal grandmother; Drug abuse in her mother; Heart attack in her father and maternal grandfather; Heart murmur in her mother; Hypertension in her maternal grandmother.   ROS:  Please see the history of present illness.   All other systems are personally reviewed and negative.   Recent Labs: 03/29/2024: ALT 22; BUN 18; Creatinine, Ser 0.94; Hemoglobin 11.2; Platelets 182; Potassium 4.1; Sodium 142  Personally reviewed   Wt Readings from Last 3 Encounters:  03/31/24 89.7 kg (197 lb 11.2 oz)  03/29/24 88.5 kg (195 lb 3.2 oz)  06/22/23 86.2 kg (190 lb)    There were no vitals taken for this visit.  PHYSICAL EXAM: General:  NAD. No resp difficulty, walked into clinic HEENT: Normal Neck: Supple. No JVD. Carotids 2+ bilat; no bruits. No lymphadenopathy or thryomegaly appreciated. Cor: PMI nondisplaced. Regular rate & rhythm. No rubs, gallops or murmurs. Lungs: Clear Abdomen: Soft, nontender, nondistended. No hepatosplenomegaly. No bruits or masses. Good bowel sounds. Extremities: No cyanosis, clubbing, rash, edema Neuro: Alert & oriented x 3, cranial nerves grossly intact. Moves all 4 extremities w/o difficulty. Affect pleasant.  Device Interrogation: HL Score 8. Average HR 82 bpm. Activity ~ 1.2 hrs/day. No shocks Personally reviewed; several short episodes of NSVT in Jan and Feb 2024.  ASSESSMENT AND PLAN: 1. Chronic Systolic Heart Failure - NICM. Cath 10/11/16 with normal cors.  - cMRI in 02/2015 showed EF 46%. No LGE. - CPX 10/2016: Peak VO2: 18.5 (82%  predicted peak VO2), VE/VCO2 slope: 30.  - Echo 12/18 EF 25-30%  - S/P [employer]  ICD 01/2018.  - Repeat CPX 9/19 with mild HF limitation. - Echo 8/21: EF ~ 40%-45%, Grade II DD, moderate MR. - Echo 10/09/21 EF 45-50% mild MR  - Echo 4/23 EF 45%, grade II DD, RV normal, mild to mod TR - NYHA II, limited more by fatigue. Volume good on exam, HL score 8 - Stop hydralazine  with low home BP.  - Continue Lasix  40 mg daily and prn metolazone , needs to take extra 40 KCL PRN. - Continue spiro 25 mg bid. - Continue Coreg  25 mg bid. - Continue Entresto  97/103 mg bid.  - Off Farxiga  due to recurrent yeast infections. - No Imdur  due to headaches.  - EF out of range for barostim device.  - Labs today.  2. HTN  - Stop hydralazine  as above. - Check BP at home and log. - Check labs today.  3. Sinus tach vs atrial flutter on ICD - Zio patch 9/20, Rare PVCs and PACs. - Feels palpitations. - Device interrogation showed NSVT last couple of months. - Place 2 week Zio to quantify. - Needs CPAP - She is due for EP follow up  4. OSA - We have arranged home sleep study in the past, she has been unable to complete - She has old CPAP but not working well - Refer to sleep medicine, suspect contributing to her fatigue.  5. DM2  - Per PCP. She is using a Dexcom. - Off Farxiga  due to recurrent yeast infections.  6. Hypokalemia - Needs to take extra 40 KCL w/ metolazone  dose. - Check labs today.  7. Lupus - On prednisone  and Plaquenil .  - Sees Rheum. Had a flare 12/23.  8. Anemia - Has h/o IDA and required iron infusions in past. - Now followed by heme/onc.  Follow up in 4 months with Dr. Darely Becknell.  Jules Oar, MD  9:56 PM  [facility] [address] [facility] [place] [address] [phone] (office) [phone] (fax)

## 2024-05-18 ENCOUNTER — Inpatient Hospital Stay (HOSPITAL_COMMUNITY)
Admission: RE | Admit: 2024-05-18 | Discharge: 2024-05-18 | Disposition: A | Discharge status: Home / Self Care | Source: Ambulatory Visit | Attending: Internal Medicine | Admitting: Internal Medicine

## 2024-05-18 ENCOUNTER — Ambulatory Visit (AMBULATORY_SURGERY_CENTER)

## 2024-05-18 VITALS — Ht 66.0 in | Wt 192.0 lb

## 2024-05-18 DIAGNOSIS — Z1211 Encounter for screening for malignant neoplasm of colon: Secondary | ICD-10-CM

## 2024-05-18 MED ORDER — NA SULFATE-K SULFATE-MG SULF 17.5-3.13-1.6 GM/177ML PO SOLN: 1.0000 | Freq: Once | ORAL | 0 refills | Status: AC

## 2024-05-18 NOTE — Progress Notes (Signed)
 No egg or soy allergy known to patient  No issues known to pt with past sedation with any surgeries or procedures Patient denies ever being told they had issues or difficulty with intubation  No FH of Malignant Hyperthermia Pt is not on diet pills Pt is not on  home 02  Pt is not on blood thinners  Pt denies issues with constipation  No A fib or A flutter Have any cardiac testing pending--No Pt can ambulate  Pt denies use of chewing tobacco Discussed diabetic I weight loss medication holds Discussed NSAID holds Checked BMI Pt instructed to use Singlecare.com or GoodRx for a price reduction on prep  Patient's chart reviewed by Cathlyn Parsons CNRA prior to previsit and patient appropriate for the [facility].  Pre visit completed and red dot placed by patient's name on their procedure day (on provider's schedule).

## 2024-05-19 ENCOUNTER — Encounter: Payer: Self-pay | Admitting: Nurse Practitioner

## 2024-05-20 ENCOUNTER — Other Ambulatory Visit: Payer: Self-pay

## 2024-05-20 NOTE — Patient Instructions (Signed)
 Visit Information  Thank you for taking time to visit with me today. Please don't hesitate to contact me if I can be of assistance to you before our next scheduled appointment.  Your next care management appointment is by telephone on Thursday, July 3 at 09:30 AM  Please call the care guide team at [phone] if you need to cancel, schedule, or reschedule an appointment.   Please call [phone]-TALK (toll free, 24 hour hotline) if you are experiencing a Mental Health or Behavioral Health Crisis or need someone to talk to.  Louanne Roussel RN BSN CCM   [facility], [facility] Nurse Care Coordinator  Direct Dial : [phone] Website: [email or web address]

## 2024-05-20 NOTE — Patient Outreach (Signed)
 Complex Care Management   Visit Note  05/20/2024  Name:  Christine Mcdowell MRN: [id number] DOB: [birth year]  Situation: Referral received for Complex Care Management related to Heart Failure, Diabetes with Complications, and Hypertensive Heart Disease with chronic Systolic Heart Failure, Chronic pain secondary to RA/Lupus, Chronic Sinusitis. I obtained verbal consent from Patient.  Visit completed with patient on the phone.  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   Discoid lupus    Fibromyalgia    History of blood transfusion "several"   "related to anemia; had some w/hysterectomy also"   Hx of cardiovascular stress test    ETT-Myoview (9/15):  No ischemia, EF 52%; NORMAL   Hx of echocardiogram    Echo (9/15):  EF 50-55%, ant HK, Gr 1 DD, mild MR, mild LAE, no effusion   Hypertension    Iron deficiency anemia    h/o iron transfusions   Lupus (systemic lupus erythematosus) (HCC)    Migraine    "a few/year" (07/03/2016)   Pneumonia 12/2015   RA (rheumatoid arthritis) (HCC)    "all over" (07/03/2016)   Sickle cell trait (HCC)    Sleep apnea    Stroke (HCC) 2014 X 1; 2015 X 2; 2016 X 1;    "right side of face more relaxed than the other; rare speech hesitation" (07/03/2016)   Vaginal Pap smear, abnormal    ASCUS; HPV    Assessment: Patient Reported Symptoms:  Cognitive Cognitive Status: Alert and oriented to person, place, and time Cognitive/Intellectual Conditions Management [RPT]: None reported or documented in medical history or problem list   Health Maintenance Behaviors: Annual physical exam, Healthy diet, Stress management Healing Pattern: Slow Health Facilitated by: Healthy diet, Prayer/meditation   Neurological Neurological Review of Symptoms: Headaches (secondary to chronic sinusitis) Neurological Conditions: Headache (polyneuropathy) Neurological Management Strategies: Medication therapy, Routine screening Neurological Self-Management Outcome: 4 (good)  HEENT HEENT Symptoms Reported: Nasal discharge, Other: Other HEENT Symptoms/Conditions: sinus headache and sinus pressure HEENT Conditions: Pain Pain Location: head, nose HEENT Management Strategies: Medication therapy, Routine screening HEENT Self-Management Outcome: 4 (good) Pain  Cardiovascular Cardiovascular Symptoms Reported: Chest pain or discomfort Does patient have uncontrolled Hypertension?: Yes Is patient checking Blood Pressure at home?: Yes Cardiovascular Conditions: Hypertension, Heart failure, Cardiomyopathy, High Blood Cholesterol Cardiovascular Management Strategies: Medication therapy, Routine screening, Diet modification Weight: 199 lb (90.3 kg) Cardiovascular Self-Management Outcome: 3 (uncertain) Cardiovascular Comment: in basket message sent to Cardiologist to notify of elevated BP  Respiratory Respiratory Symptoms Reported: No symptoms reported Respiratory Conditions: Asthma, Sleep disordered breathing Respiratory Self-Management Outcome: 4 (good)  Endocrine Patient reports the following symptoms related to hypoglycemia or hyperglycemia : No symptoms reported Is patient diabetic?: Yes Is patient checking blood sugars at home?: Yes Endocrine Conditions: Diabetes, Vitamin D  deficiency Endocrine Management Strategies: Routine screening, Diet modification, Medication therapy Endocrine Self-Management Outcome: 4 (good)  Gastrointestinal Gastrointestinal Symptoms Reported: No symptoms reported   Nutrition Risk Screen (CP): No indicators present  Genitourinary Genitourinary Symptoms Reported: No symptoms reported    Integumentary Integumentary Symptoms Reported: No symptoms reported    Musculoskeletal  Musculoskelatal Symptoms Reviewed: Difficulty walking, Muscle pain Musculoskeletal Conditions: Mobility limited, Rheumatoid arthritis, Other, Back pain, Joint pain Other Musculoskeletal  Conditions: Lupus (SLE) Musculoskeletal Management Strategies: Adequate rest, Routine screening, Medication therapy Musculoskeletal Self-Management Outcome: 4 (good) Falls in the past year?: No Number of falls in past year: 1 or less Was there an injury with Fall?: No Fall Risk Category Calculator: 0 Patient Fall Risk Level: Low Fall Risk Patient at Risk for Falls Due to: Impaired mobility Fall risk Follow up: Education provided, Falls evaluation completed  Psychosocial Psychosocial Symptoms Reported: No symptoms reported   Major Change/Loss/Stressor/Fears (CP): Medical condition, family Techniques to [place] with Loss/Stress/Change: Diversional activities, Spiritual practice(s) Quality of Family Relationships: involved, supportive, helpful Do you feel physically threatened by others?: No      05/20/2024    9:57 AM  Depression screen PHQ 2/9  Decreased Interest 0  Down, Depressed, Hopeless 0  PHQ - 2 Score 0    Vitals:   05/20/24 0908  BP: (!) 160/109    Medications Reviewed Today     Reviewed by Kaylene Pascal, RN (Registered Nurse) on 05/20/24 at [phone]  Med List Status: <None>   Medication Order Taking? Sig Documenting Provider Last Dose Status Informant  albuterol  (VENTOLIN  HFA) 108 (90 Base) MCG/ACT inhaler [id number]  Inhale 2 puffs into the lungs every 6 (six) hours as needed for wheezing or shortness of breath. Mardene Shake, FNP  Active   amoxicillin -clavulanate (AUGMENTIN ) 875-125 MG tablet [id number]  Take 1 tablet by mouth 2 (two) times daily. [provider]  Active   aspirin  81 MG chewable tablet [id number]  Chew 162 mg by mouth daily. Taking 2 tablets [provider]  Active Self  atorvastatin  (LIPITOR) 40 MG tablet [id number]  Take 1 tablet (40 mg total) by mouth  daily. Susanna Epley, FNP  Active   budesonide  (PULMICORT ) 0.5 MG/2ML nebulizer solution [id number] Yes Take 0.5 mg by nebulization 2 (two) times daily. Patient is mixing with water and using as a nasal rinse per Dr. Claudeen Crutch ENT [provider] Taking Active   carvedilol  (COREG ) 25 MG tablet [id number]  TAKE 1 TABLET BY MOUTH IN THE  MORNING AND 1 TABLET BY MOUTH IN THE EVENING WITH MEALS Bensimhon, Rheta Celestine, MD  Active   Continuous Glucose Sensor (DEXCOM G7 SENSOR) MISC [id number]  USE TO CHECK BLOOD GLUCOSE AS  DIRECTED 3 TIMES DAILY AND  CHANGE SENSOR EVERY 10 DAYS Susanna Epley, FNP  Active   diclofenac Sodium (VOLTAREN) 1 % GEL [id number]  apply A SMALL AMOUNT TO involved joints UP TO TWICE DAILY [provider]  Active   DULoxetine (CYMBALTA) 30 MG capsule [id number]  Take 30 mg by mouth daily.  Patient not taking: Reported on 05/18/2024   [provider]  Consider Medication Status and Discontinue (Change in therapy)   DULoxetine (CYMBALTA) 60 MG capsule [id number] Yes Take 60 mg by mouth daily. [provider] Taking Active   ENTRESTO  97-103 MG [id number]  TAKE 1 TABLET BY MOUTH TWICE  DAILY Bensimhon, Rheta Celestine, MD  Active   Fezolinetant  (VEOZAH ) 45 MG TABS [id number]  Take 1 tablet (45 mg total) by mouth daily.  Patient taking differently: Take 1 tablet by mouth daily. Patient taking brand product.   Susanna Epley, FNP  Active   Fluticasone  Propionate (XHANCE ) 93 MCG/ACT [place] Shorter [id number]  Place 1 spray into the nose in the morning and at bedtime.  Patient not taking: Reported on 05/18/2024   Susanna Epley, FNP  Active   folic acid  (FOLVITE ) 1 MG tablet [id number]  TAKE 1 TABLET BY MOUTH EVERY MORNING Bensimhon, Rheta Celestine,  MD  Active Self  Glucagon  (GVOKE HYPOPEN  2-PACK) 1 MG/0.2ML SOAJ [id number]  Inject 1 mg into the skin as needed. Susanna Epley, FNP  Active   hydroxychloroquine  (PLAQUENIL ) 200 MG tablet [id number]  Take 1 tablet (200 mg total) by mouth 2 (two) times  daily. Bensimhon, Daniel R, MD  Active   insulin  degludec (TRESIBA  FLEXTOUCH) 100 UNIT/ML FlexTouch Pen [id number]  INJECT SUBCUTANEOUSLY INTO SKIN  10 UNITS DAILY Moore, Janece, FNP  Active   Insulin  Pen Needle (NOVOFINE PLUS PEN NEEDLE) 32G X 4 MM MISC [id number]  Use with insulin  pens dx code e11.65 Susanna Epley, FNP  Active   Ipratropium-Albuterol  (COMBIVENT  RESPIMAT) 20-100 MCG/ACT AERS respimat [id number] Yes Inhale 1 puff into the lungs every 6 (six) hours. Susanna Epley, FNP Taking Active   ipratropium-albuterol  (DUONEB) 0.5-2.5 (3) MG/3ML SOLN [id number]  Take 3 mLs by nebulization every 6 (six) hours as needed. Susanna Epley, FNP  Active   Magnesium  400 MG TABS [id number]  Take 1 tablet by mouth daily. Susanna Epley, FNP  Active   meclizine  (ANTIVERT ) 25 MG tablet [id number] No Take 1 tablet (25 mg total) by mouth 3 (three) times daily as needed for dizziness.  Patient not taking: Reported on 05/20/2024   Sueellen Emery, MD Not Taking Active   methocarbamol (ROBAXIN) 500 MG tablet [id number]  Take 500 mg by mouth 4 (four) times daily. [provider]  Active   Methotrexate , PF, 20 MG/0.4ML SOAJ [id number]  Inject 20 mg into the skin once a week. Patient taking on Friday's [provider]  Active   metolazone  (ZAROXOLYN ) 2.5 MG tablet [id number]  TAKE 1 TABLET BY MOUTH AS NEEDED FOR WEIGHT GAIN OF 5 POUNDS WITHIN 3 DAYS AS DIRECTED needs appt for further refills Bensimhon, Daniel R, MD  Active   Multiple Vitamin (MULTIVITAMIN WITH MINERALS) TABS [id number]  Take 1 tablet by mouth every morning.  [provider]  Active Self  OLUMIANT tablet [id number]  Take 2 mg by mouth daily.  Patient not taking: Reported on 02/11/2024   [provider]  Active   ondansetron  (ZOFRAN -ODT) 4 MG disintegrating tablet [id number]  4mg  ODT q4 hours prn nausea/vomit Zammit, Joseph, MD  Active   Phenylephrine -APAP-guaiFENesin  5-325-200 MG TABS [id number] No Take by mouth 2 (two) times daily.  Per Dr. Claudeen Crutch ENT  Patient not taking: Reported on 04/01/2024   [provider] Not Taking Active   potassium chloride  (KLOR-CON  M) 10 MEQ tablet [id number]  TAKE 3 TABLETS BY MOUTH TWICE  DAILY Susanna Epley, FNP  Active   predniSONE  (DELTASONE ) 5 MG tablet [id number]  Take 5 mg by mouth daily. [provider]  Active   PROAIR  HFA 108 (90 Base) MCG/ACT inhaler [id number]  INHALE 2 PUFFS BY MOUTH EVERY DAY AS NEEDED FOR SHORTNESS OF Judithann Novas, FNP  Active   spironolactone  (ALDACTONE ) 25 MG tablet [id number]  TAKE 1 TABLET BY MOUTH IN THE  MORNING AND 1 TABLET BY MOUTH IN THE EVENING Bensimhon, Daniel R, MD  Active   Tocilizumab  (ACTEMRA  ACTPEN) 162 MG/0.9ML SOAJ [id number]  Inject 162 mg into the skin once a week. Patient is taking weekly on Fridays for Lupus. [provider]  Active   Vitamin D , Ergocalciferol , (DRISDOL ) 1.25 MG (50000 UNIT) CAPS capsule [id number] Yes Take 1 capsule (50,000 Units total) by mouth 2 (two) times a week. Susanna Epley, FNP Taking Active             Recommendation:   Specialty provider follow-up with  Cardiology and Pain Management as directed   Follow Up Plan:   Telephone follow up appointment date/time:  Thursday, July 3 at 09:30 AM  Louanne Roussel RN BSN CCM [place]  [facility], [facility] Nurse Care Coordinator  Direct Dial : [phone] Website: [email or web address]

## 2024-05-23 ENCOUNTER — Other Ambulatory Visit (HOSPITAL_COMMUNITY): Payer: Self-pay | Admitting: Internal Medicine

## 2024-05-23 ENCOUNTER — Other Ambulatory Visit: Payer: Self-pay | Admitting: Nurse Practitioner

## 2024-05-23 DIAGNOSIS — J209 Acute bronchitis, unspecified: Secondary | ICD-10-CM

## 2024-05-23 DIAGNOSIS — E1169 Type 2 diabetes mellitus with other specified complication: Secondary | ICD-10-CM

## 2024-05-26 ENCOUNTER — Ambulatory Visit (INDEPENDENT_AMBULATORY_CARE_PROVIDER_SITE_OTHER): Payer: Medicare Other

## 2024-05-26 DIAGNOSIS — I420 Dilated cardiomyopathy: Secondary | ICD-10-CM

## 2024-05-28 ENCOUNTER — Telehealth (HOSPITAL_COMMUNITY): Payer: Self-pay

## 2024-05-28 NOTE — Telephone Encounter (Signed)
 Patient called in and left message stating that she was told that her device checks are not going through and she needs the tech support number for [employer]. Will forward to device clinic for them to reach out to patient for help with this.

## 2024-05-28 NOTE — Telephone Encounter (Signed)
 I called the pt to let her know we did receive her transmission on 05/26/2024. She let me know that Dr. Renaye Carp office called and said they did not see the transmission. I activate her to send a transmission in [place]. I told her if she has any issue she can give me a call with my direct number on Monday.

## 2024-06-01 ENCOUNTER — Encounter: Admitting: Pediatrics

## 2024-06-01 ENCOUNTER — Telehealth (HOSPITAL_COMMUNITY): Payer: Self-pay

## 2024-06-01 DIAGNOSIS — M329 Systemic lupus erythematosus, unspecified: Secondary | ICD-10-CM | POA: Diagnosis not present

## 2024-06-01 DIAGNOSIS — J329 Chronic sinusitis, unspecified: Secondary | ICD-10-CM | POA: Diagnosis not present

## 2024-06-01 DIAGNOSIS — D688 Other specified coagulation defects: Secondary | ICD-10-CM | POA: Diagnosis not present

## 2024-06-01 LAB — CUP PACEART REMOTE DEVICE CHECK
Battery Remaining Longevity: 114 mo
Battery Remaining Percentage: 80 %
Brady Statistic RV Percent Paced: 0 %
Date Time Interrogation Session: 20250604140700
HighPow Impedance: 78 Ohm
Implantable Lead Connection Status: 753985
Implantable Lead Implant Date: 20190206
Implantable Lead Location: 753860
Implantable Lead Model: 292
Implantable Lead Serial Number: 438194
Implantable Pulse Generator Implant Date: 20190206
Lead Channel Impedance Value: 537 Ohm
Lead Channel Setting Pacing Amplitude: 2.5 V
Lead Channel Setting Pacing Pulse Width: 0.4 ms
Lead Channel Setting Sensing Sensitivity: 0.5 mV
Pulse Gen Serial Number: 243572
Zone Setting Status: 755011

## 2024-06-01 NOTE — Telephone Encounter (Signed)
 Called to confirm/remind patient of their appointment at the [facility] on 06/02/2024 2:00.   Appointment:   [] Confirmed  [x] Left mess   [] No answer/No voice mail  [] VM Full/unable to leave message  [] Phone not in service  Patient reminded to bring all medications and/or complete list.  Confirmed patient has transportation. Gave directions, instructed to utilize valet parking.

## 2024-06-02 ENCOUNTER — Ambulatory Visit (HOSPITAL_COMMUNITY): Payer: Self-pay | Admitting: Physician Assistant

## 2024-06-02 ENCOUNTER — Ambulatory Visit (HOSPITAL_COMMUNITY)
Admission: RE | Admit: 2024-06-02 | Discharge: 2024-06-02 | Disposition: A | Discharge status: Home / Self Care | Source: Ambulatory Visit | Attending: Family Medicine | Admitting: Family Medicine

## 2024-06-02 ENCOUNTER — Encounter (HOSPITAL_COMMUNITY): Payer: Self-pay

## 2024-06-02 VITALS — BP 118/78 | HR 75 | Ht 66.0 in | Wt 195.4 lb

## 2024-06-02 DIAGNOSIS — G4733 Obstructive sleep apnea (adult) (pediatric): Secondary | ICD-10-CM | POA: Insufficient documentation

## 2024-06-02 DIAGNOSIS — Z95810 Presence of automatic (implantable) cardiac defibrillator: Secondary | ICD-10-CM | POA: Diagnosis not present

## 2024-06-02 DIAGNOSIS — I5022 Chronic systolic (congestive) heart failure: Secondary | ICD-10-CM | POA: Diagnosis not present

## 2024-06-02 DIAGNOSIS — E119 Type 2 diabetes mellitus without complications: Secondary | ICD-10-CM | POA: Diagnosis not present

## 2024-06-02 DIAGNOSIS — E785 Hyperlipidemia, unspecified: Secondary | ICD-10-CM | POA: Diagnosis not present

## 2024-06-02 DIAGNOSIS — Z8673 Personal history of transient ischemic attack (TIA), and cerebral infarction without residual deficits: Secondary | ICD-10-CM | POA: Diagnosis not present

## 2024-06-02 DIAGNOSIS — I471 Supraventricular tachycardia, unspecified: Secondary | ICD-10-CM | POA: Insufficient documentation

## 2024-06-02 DIAGNOSIS — I110 Hypertensive heart disease with heart failure: Secondary | ICD-10-CM | POA: Insufficient documentation

## 2024-06-02 DIAGNOSIS — Z79620 Long term (current) use of immunosuppressive biologic: Secondary | ICD-10-CM | POA: Diagnosis not present

## 2024-06-02 DIAGNOSIS — I10 Essential (primary) hypertension: Secondary | ICD-10-CM | POA: Diagnosis not present

## 2024-06-02 DIAGNOSIS — M329 Systemic lupus erythematosus, unspecified: Secondary | ICD-10-CM | POA: Diagnosis not present

## 2024-06-02 DIAGNOSIS — Z794 Long term (current) use of insulin: Secondary | ICD-10-CM | POA: Insufficient documentation

## 2024-06-02 DIAGNOSIS — I428 Other cardiomyopathies: Secondary | ICD-10-CM | POA: Insufficient documentation

## 2024-06-02 DIAGNOSIS — M069 Rheumatoid arthritis, unspecified: Secondary | ICD-10-CM | POA: Diagnosis not present

## 2024-06-02 DIAGNOSIS — I4892 Unspecified atrial flutter: Secondary | ICD-10-CM | POA: Diagnosis not present

## 2024-06-02 DIAGNOSIS — D509 Iron deficiency anemia, unspecified: Secondary | ICD-10-CM | POA: Diagnosis not present

## 2024-06-02 DIAGNOSIS — I509 Heart failure, unspecified: Secondary | ICD-10-CM | POA: Diagnosis present

## 2024-06-02 DIAGNOSIS — M797 Fibromyalgia: Secondary | ICD-10-CM | POA: Diagnosis not present

## 2024-06-02 LAB — CBC
HCT: 35.4 % — ABNORMAL LOW (ref 36.0–46.0)
Hemoglobin: 11.4 g/dL — ABNORMAL LOW (ref 12.0–15.0)
MCH: 30.1 pg (ref 26.0–34.0)
MCHC: 32.2 g/dL (ref 30.0–36.0)
MCV: 93.4 fL (ref 80.0–100.0)
Platelets: 176 10*3/uL (ref 150–400)
RBC: 3.79 MIL/uL — ABNORMAL LOW (ref 3.87–5.11)
RDW: 13.5 % (ref 11.5–15.5)
WBC: 5.4 10*3/uL (ref 4.0–10.5)
nRBC: 0 % (ref 0.0–0.2)

## 2024-06-02 LAB — COMPREHENSIVE METABOLIC PANEL WITH GFR
ALT: 23 U/L (ref 0–44)
AST: 21 U/L (ref 15–41)
Albumin: 3.8 g/dL (ref 3.5–5.0)
Alkaline Phosphatase: 46 U/L (ref 38–126)
Anion gap: 4 — ABNORMAL LOW (ref 5–15)
BUN: 15 mg/dL (ref 6–20)
CO2: 29 mmol/L (ref 22–32)
Calcium: 9 mg/dL (ref 8.9–10.3)
Chloride: 107 mmol/L (ref 98–111)
Creatinine, Ser: 0.97 mg/dL (ref 0.44–1.00)
GFR, Estimated: >60 mL/min (ref >60)
Glucose, Bld: 114 mg/dL — ABNORMAL HIGH (ref 70–99)
Potassium: 3.5 mmol/L (ref 3.5–5.1)
Sodium: 140 mmol/L (ref 135–145)
Total Bilirubin: 0.5 mg/dL (ref 0.0–1.2)
Total Protein: 6.4 g/dL — ABNORMAL LOW (ref 6.5–8.1)

## 2024-06-02 LAB — IRON AND TIBC
Iron: 216 ug/dL — ABNORMAL HIGH (ref 28–170)
Saturation Ratios: 62 % — ABNORMAL HIGH (ref 10.4–31.8)
TIBC: 350 ug/dL (ref 250–450)
UIBC: 134 ug/dL

## 2024-06-02 LAB — FERRITIN: Ferritin: 41 ng/mL (ref 11–307)

## 2024-06-02 MED ORDER — CARVEDILOL 12.5 MG PO TABS
12.5000 mg | ORAL_TABLET | Freq: Two times a day (BID) | ORAL | 3 refills | Status: AC
Start: 2024-06-02 — End: ?

## 2024-06-02 NOTE — Progress Notes (Signed)
 [facility] Note   Date:  06/02/2024   PCP:  Susanna Epley, FNP  EP: Dr. Carolynne Citron Rheum: Dr. 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  Cardiac MRI in 02/2015 showed EF 46%. No LGE.    Admitted in October 2017 with CHF. L/RHC normal filling pressures, EF ~20%, and normal coronaries.   CPX 9/19 with relaltively normal spirometry. Mild HF limitation.    Echo 8/21: EF ~ 40%-45%, Grade II DD, moderate MR.  Echo 10/09/21 EF 45-50% mild MR  Echo 4/23 EF 45%  Has been following in Rheum Clinic with [facility]. Currently on Plaquenil  200 mg BID, Methotrexate  20mg  weekly, tocilizumab  weekly, and 5 mg prednisone  daily.  We have not seen her in AHF Clinic since 2/24 when she presented for an acute visit.  Here today for HF follow-up. No dyspnea, orthopnea, PND or lower extremity edema. Weight is stable. Taking all medications. Notes frequent fatigue around early afternoon, may have trouble staying awake. Sometimes blood pressure is low down to 90s systolic.     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   Discoid lupus    Fibromyalgia    History of blood transfusion several   related to anemia; had some w/hysterectomy also   Hx of cardiovascular stress test    ETT-Myoview (9/15):  No ischemia, EF 52%; NORMAL   Hx of echocardiogram    Echo (9/15):  EF 50-55%, ant HK, Gr 1 DD, mild MR, mild LAE, no  effusion   Hypertension    Iron deficiency anemia    h/o iron transfusions   Lupus (systemic lupus erythematosus) (HCC)    Migraine    a few/year (07/03/2016)   Pneumonia 12/2015   RA (rheumatoid arthritis) (HCC)    all over (07/03/2016)   Sickle cell trait (HCC)    Sleep apnea    Stroke (HCC) 2014 X 1; 2015 X 2; 2016 X 1;    right side of face more relaxed than the other; rare speech hesitation (07/03/2016)   Vaginal Pap smear, abnormal    ASCUS; HPV   Past Surgical History:  Procedure Laterality Date   ABDOMINAL HYSTERECTOMY  2009   ABDOMINAL WOUND DEHISCENCE  2009   BUNIONECTOMY Left 06/15/2020   CARDIAC CATHETERIZATION N/A 10/11/2016   Procedure: Right/Left Heart Cath and Coronary Angiography;  Surgeon: Mardell Shade, MD;  Location: [facility] INVASIVE CV LAB;  Service: Cardiovascular;  Laterality: N/A;   DILATION AND CURETTAGE OF UTERUS  1991   EYE SURGERY Bilateral    cataract 2019   HEMATOMA EVACUATION  2009   abdomen   ICD IMPLANT  01/28/2018   ICD IMPLANT N/A 01/28/2018   Procedure: ICD IMPLANT;  Surgeon: Tammie Fall, MD;  Location: [facility] INVASIVE CV LAB;  Service: Cardiovascular;  Laterality: N/A;   INCISE AND DRAIN ABCESS  2009 X 2   abdomen after hysterectomy   KNEE ARTHROSCOPY Right 1997  KNEE SURGERY Right    NASAL SINUS SURGERY  10/21/2023   TUBAL LIGATION  1996   Current Outpatient Medications  Medication Sig Dispense Refill   albuterol  (VENTOLIN  HFA) 108 (90 Base) MCG/ACT inhaler Inhale 2 puffs into the lungs every 6 (six) hours as needed for wheezing or shortness of breath. 8 g 0   aspirin  81 MG chewable tablet Chew 162 mg by mouth daily. Taking 2 tablets     atorvastatin  (LIPITOR) 40 MG tablet Take 1 tablet (40 mg total) by mouth daily. 90 tablet 3   budesonide  (PULMICORT ) 0.5 MG/2ML nebulizer solution Take 0.5 mg by nebulization 2 (two) times daily. Patient is mixing with water and using as a nasal rinse per Dr. Claudeen Crutch ENT     Continuous Glucose  Sensor (DEXCOM G7 SENSOR) MISC USE TO CHECK BLOOD GLUCOSE AS  DIRECTED 3 TIMES DAILY AND  CHANGE SENSOR EVERY 10 DAYS 10 each 2   diclofenac Sodium (VOLTAREN) 1 % GEL apply A SMALL AMOUNT TO involved joints UP TO TWICE DAILY     DULoxetine (CYMBALTA) 30 MG capsule Take 60 mg by mouth 2 (two) times daily.     ENTRESTO  97-103 MG TAKE 1 TABLET BY MOUTH TWICE  DAILY 200 tablet 2   Fezolinetant  (VEOZAH ) 45 MG TABS Take 1 tablet (45 mg total) by mouth daily. (Patient taking differently: Take 1 tablet by mouth daily. Patient taking brand product.) 30 tablet 3   Fluticasone  Propionate (XHANCE ) 93 MCG/ACT EXHU Place 1 spray into the nose in the morning and at bedtime. 16 mL 2   folic acid  (FOLVITE ) 1 MG tablet TAKE 1 TABLET BY MOUTH EVERY MORNING 30 tablet 0   Glucagon  (GVOKE HYPOPEN  2-PACK) 1 MG/0.2ML SOAJ Inject 1 mg into the skin as needed. 0.2 mL 5   hydroxychloroquine  (PLAQUENIL ) 200 MG tablet Take 1 tablet (200 mg total) by mouth 2 (two) times daily. 180 tablet 0   insulin  lispro (HUMALOG  KWIKPEN) 100 UNIT/ML KwikPen INJECT SUBCUTANEOUSLY ACCORDING  TO SLIDING SCALE MAXIMUM DAILY  DOSE: 15 UNITS 15 mL 2   Insulin  Pen Needle (NOVOFINE PLUS PEN NEEDLE) 32G X 4 MM MISC Use with insulin  pens dx code e11.65 300 each 3   Ipratropium-Albuterol  (COMBIVENT  RESPIMAT) 20-100 MCG/ACT AERS respimat Inhale 1 puff into the lungs every 6 (six) hours. 4 g 5   ipratropium-albuterol  (DUONEB) 0.5-2.5 (3) MG/3ML SOLN USE 1 VIAL VIA NEBULIZER EVERY 6 HOURS AS NEEDED 3 mL 1   Magnesium  400 MG TABS Take 1 tablet by mouth daily. 90 tablet 1   methocarbamol (ROBAXIN) 500 MG tablet Take 500 mg by mouth 4 (four) times daily.     Methotrexate , PF, 20 MG/0.4ML SOAJ Inject 20 mg into the skin once a week. Patient taking on Friday's     metolazone  (ZAROXOLYN ) 2.5 MG tablet TAKE 1 TABLET BY MOUTH AS NEEDED FOR WEIGHT GAIN OF 5 POUNDS WITHIN 3 DAYS AS DIRECTED needs appt for further refills 5 tablet 0   Multiple Vitamin (MULTIVITAMIN  WITH MINERALS) TABS Take 1 tablet by mouth every morning.      ondansetron  (ZOFRAN -ODT) 4 MG disintegrating tablet 4mg  ODT q4 hours prn nausea/vomit 10 tablet 0   Phenylephrine -APAP-guaiFENesin  5-325-200 MG TABS Take by mouth 2 (two) times daily. Per Dr. McQuirt ENT     potassium chloride  (KLOR-CON  M) 10 MEQ tablet TAKE 3 TABLETS BY MOUTH TWICE  DAILY 600 tablet 2   predniSONE  (DELTASONE ) 5 MG tablet Take 5 mg by mouth daily.  PROAIR  HFA 108 (90 Base) MCG/ACT inhaler INHALE 2 PUFFS BY MOUTH EVERY DAY AS NEEDED FOR SHORTNESS OF BREATH 18 g 2   spironolactone  (ALDACTONE ) 25 MG tablet TAKE 1 TABLET BY MOUTH IN THE  MORNING AND 1 TABLET BY MOUTH IN THE EVENING 200 tablet 2   Tocilizumab  (ACTEMRA  ACTPEN) 162 MG/0.9ML SOAJ Inject 162 mg into the skin once a week. Patient is taking weekly on Fridays for Lupus.     TRESIBA  FLEXTOUCH 100 UNIT/ML FlexTouch Pen INJECT SUBCUTANEOUSLY INTO SKIN  10 UNITS DAILY 15 mL 2   Vitamin D , Ergocalciferol , (DRISDOL ) 1.25 MG (50000 UNIT) CAPS capsule Take 1 capsule (50,000 Units total) by mouth 2 (two) times a week. 12 capsule 3   carvedilol  (COREG ) 12.5 MG tablet Take 1 tablet (12.5 mg total) by mouth 2 (two) times daily with a meal. 200 tablet 3   No current facility-administered medications for this encounter.   Allergies:   Hydrocodone -acetaminophen , Hydromorphone, Iodinated contrast media, Other, Erythromycin, Latex, Rinvoq [upadacitinib], Farxiga  [dapagliflozin ], Tape, and Mircette [desogestrel-ethinyl estradiol]   Social History:  The patient  reports that she has never smoked. She has never used smokeless tobacco. She reports that she does not drink alcohol  and does not use drugs.   Family History:  The patient's family history includes Allergies in her father and mother; Arthritis in her mother; Asthma in her maternal grandmother; Cancer in her paternal grandfather; Cushing syndrome in her father; Dementia in her paternal grandmother; Depression in her father;  Diabetes in her maternal grandmother; Drug abuse in her mother; Heart attack in her father and maternal grandfather; Heart murmur in her mother; Hypertension in her maternal grandmother.   ROS:  Please see the history of present illness.   All other systems are personally reviewed and negative.   Recent Labs: 03/29/2024: ALT 22; BUN 18; Creatinine, Ser 0.94; Potassium 4.1; Sodium 142 06/02/2024: Hemoglobin 11.4; Platelets 176  Personally reviewed   Wt Readings from Last 3 Encounters:  06/02/24 88.6 kg (195 lb 6.4 oz)  05/20/24 90.3 kg (199 lb)  05/18/24 87.1 kg (192 lb)    BP 118/78   Pulse 75   Ht 5' 6 (1.676 m)   Wt 88.6 kg (195 lb 6.4 oz)   SpO2 96%   BMI 31.54 kg/m   PHYSICAL EXAM: General:  Well appearing.  Neck: no JVD.  Cor: PRegular rate & rhythm. No rubs, gallops or murmurs. Lungs: clear Abdomen: soft, nontender, nondistended. Extremities: no edema Neuro: alert & orientedx3. Affect pleasant   Device Interrogation: HL is 0, activity level ~1 hr/day, multiple events stored as NSVT and VT since 05/16 (longest 1 minute and 29 seconds).  ASSESSMENT AND PLAN: 1. Chronic Systolic Heart Failure - NICM. Cath 10/11/16 with normal cors.  - cMRI in 02/2015 showed EF 46%. No LGE. - CPX 10/2016: Peak VO2: 18.5 (82% predicted peak VO2), VE/VCO2 slope: 30.  - Echo 12/18 EF 25-30%  - S/P [employer] ICD 01/2018.  - Repeat CPX 9/19 with mild HF limitation. - Echo 8/21: EF ~ 40%-45%, Grade II DD, moderate MR. - Echo 10/09/21 EF 45-50% mild MR  - Echo 4/23 EF 45%, grade II DD, RV normal, mild to mod TR - NYHA II, most limited by fatigue. Volume looks good. HL is 0.  - Continue Lasix  40 mg daily and prn metolazone , needs to take extra 40 KCL with metolazone . - Continue spiro 25 mg bid. - Decrease coreg  to 12.5 mg BID d/t fatigue and low  blood pressures at home. - Continue Entresto  97/103 mg bid.  - Off Farxiga  due to recurrent yeast infections. - EF out of range  for barostim device.  - Repeat echo - Labs today  2. HTN  - BP low at home - Med changes as above  3. Sinus tach/atrial flutter/SVT on ICD - Zio patch 9/20, Rare PVCs and PACs. - 13 day Zio 03/24: SR, one 7 beat run SVT, rare PACs and PVCs -  Multiple episodes recorded as NSVT and VT on device check today. Reviewed with EP, appear more consistent with SVT. - She is due for EP follow-up  4. OSA - We have arranged home sleep study in the past, she has been unable to complete - She has old CPAP but not working well - Refer to pulmonary for OSA. Suspect untreated OSA may be contributing to fatigue  5. DM2  - Per PCP.  - Off Farxiga  due to recurrent yeast infections.  6. Lupus - On prednisone , Plaquenil , methotrexate  and actemra  - Sees Rheum.   8. Anemia - Has h/o IDA and required iron infusions in past. - Now followed by heme/onc. - Given fatigue will check CBC and iron studies  Follow up 4 months with Dr. Julane Ny  [facility], Angelena Barber, PA-C  4:09 PM  [facility] [address] [facility] [place] [address] [phone] (office) [phone] (fax)

## 2024-06-02 NOTE — Patient Instructions (Signed)
 DECREASE Carvedilol  to 12.5 mg Twice daily  Labs done today, your results will be available in MyChart, we will contact you for abnormal readings.  Your physician has requested that you have an echocardiogram. Echocardiography is a painless test that uses sound waves to create images of your heart. It provides your doctor with information about the size and shape of your heart and how well your heart's chambers and valves are working. This procedure takes approximately one hour. There are no restrictions for this procedure. Please do NOT wear cologne, perfume, aftershave, or lotions (deodorant is allowed). Please arrive 15 minutes prior to your appointment time.  Please note: We ask at that you not bring children with you during ultrasound (echo/ vascular) testing. Due to room size and safety concerns, children are not allowed in the ultrasound rooms during exams. Our front office staff cannot provide observation of children in our lobby area while testing is being conducted. An adult accompanying a patient to their appointment will only be allowed in the ultrasound room at the discretion of the ultrasound technician under special circumstances. We apologize for any inconvenience.  PLEASE CALL DR. Meridith Stanford OFFICE AS YOU ARE OVER DUE A FOLLOW UP.  You have been referred to Pulmonology. They will call you to arrange your appointment.  Your physician recommends that you schedule a follow-up appointment in: 4 months (October) ** PLEASE CALL THE OFFICE IN [place] TO ARRANGE YOUR FOLLOW UP APPOINTMENT.**  If you have any questions or concerns before your next appointment please send us  a message through [place] or call our office at [phone].    TO LEAVE A MESSAGE FOR THE NURSE SELECT OPTION 2, PLEASE LEAVE A MESSAGE INCLUDING: YOUR NAME DATE OF BIRTH CALL BACK NUMBER REASON FOR CALL**this is important as we prioritize the call backs  YOU WILL RECEIVE A CALL BACK THE SAME DAY AS LONG AS YOU CALL  BEFORE 4:00 PM  At the [facility], you and your health needs are our priority. As part of our continuing mission to provide you with exceptional heart care, we have created designated Provider Care Teams. These Care Teams include your primary Cardiologist (physician) and Advanced Practice Providers (APPs- Physician Assistants and Nurse Practitioners) who all work together to provide you with the care you need, when you need it.   You may see any of the following providers on your designated Care Team at your next follow up: Dr Jules Oar Dr Peder Bourdon Dr. Alwin Baars Dr. Arta Lark Amy Marijane Shoulders, NP Ruddy Corral, [place] [facility] [place], [place] Dennise Fitz, NP [place] Lee, NP Shawnee Dellen, NP Luster Salters, PharmD Bevely Brush, PharmD   Please be sure to bring in all your medications bottles to every appointment.    Thank you for choosing [facility]

## 2024-06-04 ENCOUNTER — Ambulatory Visit: Payer: Self-pay | Admitting: Internal Medicine

## 2024-06-08 MED ORDER — POTASSIUM CHLORIDE CRYS ER 10 MEQ PO TBCR: 40.0000 meq | EXTENDED_RELEASE_TABLET | Freq: Two times a day (BID) | ORAL | Status: DC

## 2024-06-12 ENCOUNTER — Other Ambulatory Visit: Payer: Self-pay | Admitting: Nurse Practitioner

## 2024-06-12 DIAGNOSIS — N951 Menopausal and female climacteric states: Secondary | ICD-10-CM

## 2024-06-15 DIAGNOSIS — M47816 Spondylosis without myelopathy or radiculopathy, lumbar region: Secondary | ICD-10-CM | POA: Diagnosis not present

## 2024-06-15 DIAGNOSIS — M62838 Other muscle spasm: Secondary | ICD-10-CM | POA: Diagnosis not present

## 2024-06-15 DIAGNOSIS — Z79899 Other long term (current) drug therapy: Secondary | ICD-10-CM | POA: Diagnosis not present

## 2024-06-15 DIAGNOSIS — M329 Systemic lupus erythematosus, unspecified: Secondary | ICD-10-CM | POA: Diagnosis not present

## 2024-06-15 DIAGNOSIS — M797 Fibromyalgia: Secondary | ICD-10-CM | POA: Diagnosis not present

## 2024-06-15 DIAGNOSIS — E119 Type 2 diabetes mellitus without complications: Secondary | ICD-10-CM | POA: Diagnosis not present

## 2024-06-21 ENCOUNTER — Telehealth: Payer: Self-pay

## 2024-06-21 ENCOUNTER — Ambulatory Visit (HOSPITAL_COMMUNITY): Admission: RE | Admit: 2024-06-21 | Source: Ambulatory Visit

## 2024-06-21 NOTE — Progress Notes (Deleted)
 [facility] History and Physical   Primary Care Physician:  Georgina Speaks, FNP   Reason for Procedure:  Colorectal cancer screening  Plan:    Screening colonoscopy     HPI: Christine Mcdowell is a 53 y.o. female undergoing screening colonoscopy for colorectal cancer screening.  This is the patient's first colonoscopy.  Cologuard was negative in 2022.  No family history of colorectal cancer or polyps.  Patient denies rectal bleeding or change in bowel habits at the time of this exam.   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   Discoid lupus    Fibromyalgia    History of blood transfusion several   related to anemia; had some w/hysterectomy also   Hx of cardiovascular stress test    ETT-Myoview (9/15):  No ischemia, EF 52%; NORMAL   Hx of echocardiogram    Echo (9/15):  EF 50-55%, ant HK, Gr 1 DD, mild MR, mild LAE, no effusion   Hypertension    Iron deficiency anemia    h/o iron transfusions   Lupus (systemic lupus erythematosus) (HCC)    Migraine    a few/year (07/03/2016)   Pneumonia 12/2015   RA (rheumatoid arthritis) (HCC)    all over (07/03/2016)   Sickle cell trait (HCC)    Sleep apnea    Stroke (HCC) 2014 X 1; 2015 X 2; 2016 X 1;    right side of face more relaxed than the other; rare speech hesitation (07/03/2016)   Vaginal Pap smear, abnormal    ASCUS; HPV    Past Surgical History:  Procedure Laterality Date   ABDOMINAL HYSTERECTOMY  2009   ABDOMINAL WOUND DEHISCENCE  2009   BUNIONECTOMY Left 06/15/2020   CARDIAC CATHETERIZATION N/A 10/11/2016   Procedure: Right/Left Heart Cath and Coronary Angiography;  Surgeon: Toribio JONELLE Fuel, MD;  Location: [facility] INVASIVE CV LAB;  Service:  Cardiovascular;  Laterality: N/A;   DILATION AND CURETTAGE OF UTERUS  1991   EYE SURGERY Bilateral    cataract 2019   HEMATOMA EVACUATION  2009   abdomen   ICD IMPLANT  01/28/2018   ICD IMPLANT N/A 01/28/2018   Procedure: ICD IMPLANT;  Surgeon: Waddell Danelle ORN, MD;  Location: [facility] INVASIVE CV LAB;  Service: Cardiovascular;  Laterality: N/A;   INCISE AND DRAIN ABCESS  2009 X 2   abdomen after hysterectomy   KNEE ARTHROSCOPY Right 1997   KNEE SURGERY Right    NASAL SINUS SURGERY  10/21/2023   TUBAL LIGATION  1996    Prior to Admission medications   Medication Sig Start Date End Date Taking? Authorizing Provider  albuterol  (VENTOLIN  HFA) 108 (90 Base) MCG/ACT inhaler Inhale 2 puffs into the lungs every 6 (six) hours as needed for wheezing or shortness of breath. 03/26/23   Kennyth Domino, FNP  aspirin  81 MG chewable tablet Chew 162 mg by mouth daily. Taking 2 tablets    [provider]  atorvastatin  (LIPITOR) 40 MG tablet Take 1 tablet (40 mg total) by mouth daily. 04/01/24   Georgina Speaks, FNP  budesonide  (PULMICORT ) 0.5 MG/2ML nebulizer solution Take 0.5 mg by nebulization 2 (two) times daily. Patient is mixing with water and using as a nasal rinse per Dr.  McQuirt ENT    [provider]  carvedilol  (COREG ) 12.5 MG tablet Take 1 tablet (12.5 mg total) by mouth 2 (two) times daily with a meal. 06/02/24   Colletta Manuelita Garre, PA-C  Continuous Glucose Sensor (DEXCOM G7 SENSOR) MISC USE TO CHECK BLOOD GLUCOSE AS  DIRECTED 3 TIMES DAILY AND  CHANGE SENSOR EVERY 10 DAYS 03/22/24   Georgina Speaks, FNP  diclofenac Sodium (VOLTAREN) 1 % GEL apply A SMALL AMOUNT TO involved joints UP TO TWICE DAILY 12/20/21   [provider]  DULoxetine (CYMBALTA) 30 MG capsule Take 60 mg by mouth 2 (two) times daily.    [provider]  ENTRESTO  97-103 MG TAKE 1 TABLET BY MOUTH TWICE  DAILY 11/18/23   Bensimhon, Toribio SAUNDERS, MD  Fluticasone  Propionate (XHANCE ) 93 MCG/ACT EXHU Place 1  spray into the nose in the morning and at bedtime. 05/26/23   Georgina Speaks, FNP  folic acid  (FOLVITE ) 1 MG tablet TAKE 1 TABLET BY MOUTH EVERY MORNING 01/30/17   Bensimhon, Toribio SAUNDERS, MD  Glucagon  (GVOKE HYPOPEN  2-PACK) 1 MG/0.2ML SOAJ Inject 1 mg into the skin as needed. 07/02/22   Georgina Speaks, FNP  hydroxychloroquine  (PLAQUENIL ) 200 MG tablet Take 1 tablet (200 mg total) by mouth 2 (two) times daily. 02/02/19   Bensimhon, Toribio SAUNDERS, MD  insulin  lispro (HUMALOG  KWIKPEN) 100 UNIT/ML KwikPen INJECT SUBCUTANEOUSLY ACCORDING  TO SLIDING SCALE MAXIMUM DAILY  DOSE: 15 UNITS 06/01/24   Georgina Speaks, FNP  Insulin  Pen Needle (NOVOFINE PLUS PEN NEEDLE) 32G X 4 MM MISC Use with insulin  pens dx code e11.65 11/13/21   Georgina Speaks, FNP  Ipratropium-Albuterol  (COMBIVENT  RESPIMAT) 20-100 MCG/ACT AERS respimat Inhale 1 puff into the lungs every 6 (six) hours. 09/30/22   Georgina Speaks, FNP  ipratropium-albuterol  (DUONEB) 0.5-2.5 (3) MG/3ML SOLN USE 1 VIAL VIA NEBULIZER EVERY 6 HOURS AS NEEDED 06/01/24   Georgina Speaks, FNP  Magnesium  400 MG TABS Take 1 tablet by mouth daily. 04/12/22   Moore, Janece, FNP  methocarbamol (ROBAXIN) 500 MG tablet Take 500 mg by mouth 4 (four) times daily.    [provider]  Methotrexate , PF, 20 MG/0.4ML SOAJ Inject 20 mg into the skin once a week. Patient taking on Friday's    [provider]  metolazone  (ZAROXOLYN ) 2.5 MG tablet TAKE 1 TABLET BY MOUTH AS NEEDED FOR WEIGHT GAIN OF 5 POUNDS WITHIN 3 DAYS AS DIRECTED needs appt for further refills 10/17/22   Bensimhon, Daniel R, MD  Multiple Vitamin (MULTIVITAMIN WITH MINERALS) TABS Take 1 tablet by mouth every morning.     [provider]  ondansetron  (ZOFRAN -ODT) 4 MG disintegrating tablet 4mg  ODT q4 hours prn nausea/vomit 06/22/23   Zammit, Joseph, MD  Phenylephrine -APAP-guaiFENesin  5-325-200 MG TABS Take by mouth 2 (two) times daily. Per Dr. Alla ENT    [provider]  potassium chloride  (KLOR-CON  M) 10  MEQ tablet Take 4 tablets (40 mEq total) by mouth 2 (two) times daily. 06/08/24   Colletta Manuelita Garre, PA-C  predniSONE  (DELTASONE ) 5 MG tablet Take 5 mg by mouth daily. 10/02/22   [provider]  PROAIR  HFA 108 (90 Base) MCG/ACT inhaler INHALE 2 PUFFS BY MOUTH EVERY DAY AS NEEDED FOR SHORTNESS OF BREATH 09/30/22   Georgina Speaks, FNP  spironolactone  (ALDACTONE ) 25 MG tablet TAKE 1 TABLET BY MOUTH IN THE  MORNING AND 1 TABLET BY MOUTH IN THE EVENING 11/18/23   Bensimhon, Toribio SAUNDERS, MD  Tocilizumab  (ACTEMRA  ACTPEN) 162 MG/0.9ML SOAJ Inject 162 mg  into the skin once a week. Patient is taking weekly on Fridays for Lupus.    [provider]  TRESIBA  FLEXTOUCH 100 UNIT/ML FlexTouch Pen INJECT SUBCUTANEOUSLY INTO SKIN  10 UNITS DAILY 06/01/24   Georgina Speaks, FNP  VEOZAH  45 MG TABS TAKE 1 TABLET BY MOUTH DAILY 06/14/24   Moore, Janece, FNP  Vitamin D , Ergocalciferol , (DRISDOL ) 1.25 MG (50000 UNIT) CAPS capsule Take 1 capsule (50,000 Units total) by mouth 2 (two) times a week. 09/30/22   Georgina Speaks, FNP    Current Outpatient Medications  Medication Sig Dispense Refill   albuterol  (VENTOLIN  HFA) 108 (90 Base) MCG/ACT inhaler Inhale 2 puffs into the lungs every 6 (six) hours as needed for wheezing or shortness of breath. 8 g 0   aspirin  81 MG chewable tablet Chew 162 mg by mouth daily. Taking 2 tablets     atorvastatin  (LIPITOR) 40 MG tablet Take 1 tablet (40 mg total) by mouth daily. 90 tablet 3   budesonide  (PULMICORT ) 0.5 MG/2ML nebulizer solution Take 0.5 mg by nebulization 2 (two) times daily. Patient is mixing with water and using as a nasal rinse per Dr. McQuirt ENT     carvedilol  (COREG ) 12.5 MG tablet Take 1 tablet (12.5 mg total) by mouth 2 (two) times daily with a meal. 200 tablet 3   Continuous Glucose Sensor (DEXCOM G7 SENSOR) MISC USE TO CHECK BLOOD GLUCOSE AS  DIRECTED 3 TIMES DAILY AND  CHANGE SENSOR EVERY 10 DAYS 10 each 2   diclofenac Sodium (VOLTAREN) 1 % GEL apply A  SMALL AMOUNT TO involved joints UP TO TWICE DAILY     DULoxetine (CYMBALTA) 30 MG capsule Take 60 mg by mouth 2 (two) times daily.     ENTRESTO  97-103 MG TAKE 1 TABLET BY MOUTH TWICE  DAILY 200 tablet 2   Fluticasone  Propionate (XHANCE ) 93 MCG/ACT EXHU Place 1 spray into the nose in the morning and at bedtime. 16 mL 2   folic acid  (FOLVITE ) 1 MG tablet TAKE 1 TABLET BY MOUTH EVERY MORNING 30 tablet 0   Glucagon  (GVOKE HYPOPEN  2-PACK) 1 MG/0.2ML SOAJ Inject 1 mg into the skin as needed. 0.2 mL 5   hydroxychloroquine  (PLAQUENIL ) 200 MG tablet Take 1 tablet (200 mg total) by mouth 2 (two) times daily. 180 tablet 0   insulin  lispro (HUMALOG  KWIKPEN) 100 UNIT/ML KwikPen INJECT SUBCUTANEOUSLY ACCORDING  TO SLIDING SCALE MAXIMUM DAILY  DOSE: 15 UNITS 15 mL 2   Insulin  Pen Needle (NOVOFINE PLUS PEN NEEDLE) 32G X 4 MM MISC Use with insulin  pens dx code e11.65 300 each 3   Ipratropium-Albuterol  (COMBIVENT  RESPIMAT) 20-100 MCG/ACT AERS respimat Inhale 1 puff into the lungs every 6 (six) hours. 4 g 5   ipratropium-albuterol  (DUONEB) 0.5-2.5 (3) MG/3ML SOLN USE 1 VIAL VIA NEBULIZER EVERY 6 HOURS AS NEEDED 3 mL 1   Magnesium  400 MG TABS Take 1 tablet by mouth daily. 90 tablet 1   methocarbamol (ROBAXIN) 500 MG tablet Take 500 mg by mouth 4 (four) times daily.     Methotrexate , PF, 20 MG/0.4ML SOAJ Inject 20 mg into the skin once a week. Patient taking on Friday's     metolazone  (ZAROXOLYN ) 2.5 MG tablet TAKE 1 TABLET BY MOUTH AS NEEDED FOR WEIGHT GAIN OF 5 POUNDS WITHIN 3 DAYS AS DIRECTED needs appt for further refills 5 tablet 0   Multiple Vitamin (MULTIVITAMIN WITH MINERALS) TABS Take 1 tablet by mouth every morning.      ondansetron  (ZOFRAN -ODT) 4 MG disintegrating  tablet 4mg  ODT q4 hours prn nausea/vomit 10 tablet 0   Phenylephrine -APAP-guaiFENesin  5-325-200 MG TABS Take by mouth 2 (two) times daily. Per Dr. Alla ENT     potassium chloride  (KLOR-CON  M) 10 MEQ tablet Take 4 tablets (40 mEq total) by mouth  2 (two) times daily.     predniSONE  (DELTASONE ) 5 MG tablet Take 5 mg by mouth daily.     PROAIR  HFA 108 (90 Base) MCG/ACT inhaler INHALE 2 PUFFS BY MOUTH EVERY DAY AS NEEDED FOR SHORTNESS OF BREATH 18 g 2   spironolactone  (ALDACTONE ) 25 MG tablet TAKE 1 TABLET BY MOUTH IN THE  MORNING AND 1 TABLET BY MOUTH IN THE EVENING 200 tablet 2   Tocilizumab  (ACTEMRA  ACTPEN) 162 MG/0.9ML SOAJ Inject 162 mg into the skin once a week. Patient is taking weekly on Fridays for Lupus.     TRESIBA  FLEXTOUCH 100 UNIT/ML FlexTouch Pen INJECT SUBCUTANEOUSLY INTO SKIN  10 UNITS DAILY 15 mL 2   VEOZAH  45 MG TABS TAKE 1 TABLET BY MOUTH DAILY 30 tablet 3   Vitamin D , Ergocalciferol , (DRISDOL ) 1.25 MG (50000 UNIT) CAPS capsule Take 1 capsule (50,000 Units total) by mouth 2 (two) times a week. 12 capsule 3   No current facility-administered medications for this visit.    Allergies as of 06/22/2024 - Review Complete 06/02/2024  Allergen Reaction Noted   Hydrocodone -acetaminophen  Nausea And Vomiting 12/04/2020   Hydromorphone Nausea And Vomiting 09/12/2014   Iodinated contrast media Other (See Comments) 03/16/2015   Other Other (See Comments) and Anaphylaxis 07/03/2016   Erythromycin Nausea And Vomiting 07/12/2012   Latex Hives 07/12/2012   Rinvoq [upadacitinib] Other (See Comments) 04/12/2021   Farxiga  [dapagliflozin ] Other (See Comments) 04/12/2021   Tape Other (See Comments) 10/18/2018   Mircette [desogestrel-ethinyl estradiol] Nausea And Vomiting and Rash     Family History  Problem Relation Age of Onset   Arthritis Mother    Heart murmur Mother    Drug abuse Mother    Allergies Mother    Heart attack Father    Cushing syndrome Father    Depression Father    Allergies Father    Diabetes Maternal Grandmother    Hypertension Maternal Grandmother    Asthma Maternal Grandmother    Heart attack Maternal Grandfather    Dementia Paternal Grandmother    Cancer Paternal Grandfather    Breast cancer Neg Hx     Colon cancer Neg Hx    Rectal cancer Neg Hx    Stomach cancer Neg Hx     Social History   Socioeconomic History   Marital status: Single    Spouse name: Not on file   Number of children: Not on file   Years of education: Not on file   Highest education level: Some college, no degree  Occupational History   Not on file  Tobacco Use   Smoking status: Never   Smokeless tobacco: Never  Vaping Use   Vaping status: Never Used  Substance and Sexual Activity   Alcohol  use: No   Drug use: No   Sexual activity: Not Currently    Birth control/protection: Surgical    Comment: hyst  Other Topics Concern   Not on file  Social History Narrative   Not on file   Social Drivers of Health   Financial Resource Strain: High Risk (05/20/2024)   Overall Financial Resource Strain (CARDIA)    Difficulty of Paying Living Expenses: Hard  Food Insecurity: Food Insecurity Present (05/20/2024)   Hunger Vital  Sign    Worried About Programme researcher, broadcasting/film/video in the Last Year: Often true    Ran Out of Food in the Last Year: Often true  Transportation Needs: No Transportation Needs (05/20/2024)   PRAPARE - Administrator, Civil Service (Medical): No    Lack of Transportation (Non-Medical): No  Recent Concern: Transportation Needs - Unmet Transportation Needs (03/29/2024)   PRAPARE - Transportation    Lack of Transportation (Medical): Yes    Lack of Transportation (Non-Medical): Yes  Physical Activity: Insufficiently Active (05/20/2024)   Exercise Vital Sign    Days of Exercise per Week: 2 days    Minutes of Exercise per Session: 10 min  Stress: No Stress Concern Present (05/20/2024)   [employer] of Occupational Health - Occupational Stress Questionnaire    Feeling of Stress : Not at all  Recent Concern: Stress - Stress Concern Present (03/29/2024)   [employer] of Occupational Health - Occupational Stress Questionnaire    Feeling of Stress : Rather much  Social Connections:  Moderately Integrated (05/20/2024)   Social Connection and Isolation Panel    Frequency of Communication with Friends and Family: More than three times a week    Frequency of Social Gatherings with Friends and Family: More than three times a week    Attends Religious Services: More than 4 times per year    Active Member of [employer] or Organizations: Yes    Attends Engineer, structural: More than 4 times per year    Marital Status: Divorced  Intimate Partner Violence: Not At Risk (05/20/2024)   Humiliation, Afraid, Rape, and Kick questionnaire    Fear of Current or Ex-Partner: No    Emotionally Abused: No    Physically Abused: No    Sexually Abused: No    Review of Systems:  All other review of systems negative except as mentioned in the HPI.  Physical Exam: Vital signs There were no vitals taken for this visit.  General:   Alert,  Well-developed, well-nourished, pleasant and cooperative in NAD Airway:  Mallampati  Lungs:  Clear throughout to auscultation.   Heart:  Regular rate and rhythm; no murmurs, clicks, rubs,  or gallops. Abdomen:  Soft, nontender and nondistended. Normal bowel sounds.   Neuro/Psych:  Normal mood and affect. A and O x 3  Inocente Hausen, MD [facility]

## 2024-06-21 NOTE — Telephone Encounter (Signed)
 Called pt to see if she could come in early for her procedure tomorrow.  Pt stated that she was unaware that a colonoscopy had been scheduled.  Pt stated that she thought she had told the nurse that she wasn't sure about her school schedule and was unsure if she could come in.  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  Pt requested for a nurse to call her back to reschedule procedure.

## 2024-06-22 ENCOUNTER — Encounter: Admitting: Pediatrics

## 2024-06-22 NOTE — Telephone Encounter (Signed)
 This patient was a direct colonoscopy. Patient can call at her convenience to reschedule PV and colonoscopy appt. MyChart message sent to patient.

## 2024-06-24 ENCOUNTER — Telehealth: Payer: Self-pay

## 2024-06-24 NOTE — Progress Notes (Signed)
 Complex Care Management Care Guide Note  06/24/2024 Name: Delynda Sepulveda MRN: [id number] DOB: Oct 14, 1971  Gudrun Axe is a 53 y.o. year old female who is a primary care patient of Georgina Speaks, FNP and is actively engaged with the care management team. I reached out to Hart Marc by phone today to assist with re-scheduling  with the RN Case Manager.  Follow up plan: Unsuccessful telephone outreach attempt made. A HIPAA compliant phone message was left for the patient providing contact information and requesting a return call.  Leotis Rase [facility], [facility] Guide  Direct Dial : [phone]  Fax [phone]

## 2024-07-05 ENCOUNTER — Telehealth: Payer: Self-pay

## 2024-07-05 DIAGNOSIS — D508 Other iron deficiency anemias: Secondary | ICD-10-CM | POA: Diagnosis not present

## 2024-07-05 DIAGNOSIS — D688 Other specified coagulation defects: Secondary | ICD-10-CM | POA: Diagnosis not present

## 2024-07-05 NOTE — Progress Notes (Signed)
 Complex Care Management Care Guide Note  07/05/2024 Name: Christine Mcdowell MRN: [id number] DOB: 11-16-1971  Christine Mcdowell is a 53 y.o. year old female who is a primary care patient of Georgina Speaks, FNP and is actively engaged with the care management team. I reached out to Hart Marc by phone today to assist with re-scheduling  with the RN Case Manager.  Follow up plan: Telephone appointment with complex care management team member scheduled for:  08-03-24  Leotis Rase [facility], [facility] Guide  Direct Dial : [phone]  Fax [phone]

## 2024-07-06 ENCOUNTER — Telehealth (HOSPITAL_COMMUNITY): Payer: Self-pay | Admitting: Cardiology

## 2024-07-06 ENCOUNTER — Ambulatory Visit (HOSPITAL_COMMUNITY)
Admission: RE | Admit: 2024-07-06 | Discharge: 2024-07-06 | Disposition: A | Discharge status: Home / Self Care | Source: Ambulatory Visit | Attending: Physician Assistant | Admitting: Physician Assistant

## 2024-07-06 DIAGNOSIS — I420 Dilated cardiomyopathy: Secondary | ICD-10-CM | POA: Diagnosis not present

## 2024-07-06 DIAGNOSIS — G249 Dystonia, unspecified: Secondary | ICD-10-CM | POA: Diagnosis not present

## 2024-07-06 DIAGNOSIS — Z950 Presence of cardiac pacemaker: Secondary | ICD-10-CM | POA: Diagnosis not present

## 2024-07-06 DIAGNOSIS — I5022 Chronic systolic (congestive) heart failure: Secondary | ICD-10-CM | POA: Insufficient documentation

## 2024-07-06 DIAGNOSIS — G4730 Sleep apnea, unspecified: Secondary | ICD-10-CM | POA: Insufficient documentation

## 2024-07-06 DIAGNOSIS — E1165 Type 2 diabetes mellitus with hyperglycemia: Secondary | ICD-10-CM | POA: Diagnosis not present

## 2024-07-06 DIAGNOSIS — E119 Type 2 diabetes mellitus without complications: Secondary | ICD-10-CM | POA: Insufficient documentation

## 2024-07-06 DIAGNOSIS — Z006 Encounter for examination for normal comparison and control in clinical research program: Secondary | ICD-10-CM

## 2024-07-06 DIAGNOSIS — M7918 Myalgia, other site: Secondary | ICD-10-CM | POA: Diagnosis not present

## 2024-07-06 DIAGNOSIS — I110 Hypertensive heart disease with heart failure: Secondary | ICD-10-CM | POA: Insufficient documentation

## 2024-07-06 DIAGNOSIS — I3139 Other pericardial effusion (noninflammatory): Secondary | ICD-10-CM | POA: Insufficient documentation

## 2024-07-06 LAB — ECHOCARDIOGRAM COMPLETE
S' Lateral: 3.61 cm
Single Plane A4C EF: 57.7 %

## 2024-07-06 MED ORDER — SACUBITRIL-VALSARTAN 24-26 MG PO TABS: 1.0000 | ORAL_TABLET | Freq: Two times a day (BID) | ORAL | 11 refills | Status: DC

## 2024-07-06 NOTE — Progress Notes (Signed)
  Echocardiogram 2D Echocardiogram has been performed.  Koleen KANDICE Popper, RDCS 07/06/2024, 2:56 PM

## 2024-07-06 NOTE — Telephone Encounter (Signed)
 Patient called to report multiple low b/p readings at pain center at [facility] 75/48 78/51 82/69   Above readings were with AM meds Reports coreg  was recently to 6.25 due to low b/p reading -no home b/p readings Weight stable at 192, weight x 1 mth ago 198    Please advise

## 2024-07-06 NOTE — Telephone Encounter (Signed)
Pt aware.

## 2024-07-07 DIAGNOSIS — M545 Low back pain, unspecified: Secondary | ICD-10-CM | POA: Diagnosis not present

## 2024-07-07 DIAGNOSIS — M5459 Other low back pain: Secondary | ICD-10-CM | POA: Diagnosis not present

## 2024-07-07 DIAGNOSIS — G8929 Other chronic pain: Secondary | ICD-10-CM | POA: Diagnosis not present

## 2024-07-07 NOTE — Research (Addendum)
 SITE: 050     Subject # [id number]   Subprotocol: A  Inclusion Criteria  Patients who meet all of the following criteria are eligible for enrollment as study participants:  Yes No  Age > 55 years old X   Eligible to wear Holter Study X    Exclusion Criteria  Patients who meet any of these criteria are not eligible for enrollment as study participants: Yes No  1. Receiving any mechanical (respiratory or circulatory) or renal support therapy at Screening or during Visit #1.  X  2.  Any other conditions that in the opinion of the investigators are likely to prevent compliance with the study protocol or pose a safety concern if the subject participates in the study.  X  3. Poor tolerance, namely susceptible to severe skin allergies from ECG adhesive patch application.  X   Protocol: REV H    60 minute start window         Cor device must be applied, and the study initiated, no later than 60 minutes of completing the Echocardiogram                             HH:MM  Echo completion time  14:56  2.   Cor Study start time  15:13   30-Minute execution window  Once Cor Monitoring begins, 3 QT Med ECGs and the 15-minute rest period must be completed within a 30 minute window     HH:MM  3. QT Med ECG Completion time  QT Med patch malfunction x5.   4. Start of 15-Min sitting rest period  15:18  5. End of 15-Min rest period  15:33  6. Time of device removal  13:59   *Continue to use the Mobile App Event feature to log the Rest period windows and follow instructions on the EF-ACT Clinical Trial  Patient Instruction Card.  Describe any anomalies in Protocol execution in the Protocol Deviation Log    Residential Zip code [address] (First 3 digits ONLY)                                           [facility] Informed Consent   Subject Name: Christine Mcdowell  Subject met inclusion and exclusion criteria.  The informed consent form, study requirements and expectations were reviewed with the subject.  Subject had opportunity to read consent and questions and concerns were addressed prior to the signing of the consent form.  The subject verbalized understanding of the trial requirements.  The subject agreed to participate in the [facility] EF ACT trial and signed the informed consent at 15:07 on 06-Jul-2024.  The informed consent was obtained prior to performance of any protocol-specific procedures for the subject.  A copy of the signed informed consent was given to the subject and a copy was placed in the subject's medical record.   Dorthea LITTIE Louder          Current Outpatient Medications:    albuterol  (VENTOLIN  HFA) 108 (90 Base) MCG/ACT inhaler, Inhale 2 puffs into the lungs every 6 (six) hours as needed for wheezing or shortness of breath., Disp: 8 g, Rfl: 0   aspirin  81 MG chewable tablet, Chew 162 mg by mouth daily. Taking 2 tablets, Disp: , Rfl:    atorvastatin  (LIPITOR) 40 MG tablet, Take 1 tablet (40 mg total) by  mouth daily., Disp: 90 tablet, Rfl: 3   budesonide  (PULMICORT ) 0.5 MG/2ML nebulizer solution, Take 0.5 mg by nebulization 2 (two) times daily. Patient is mixing with water and using as a nasal rinse per Dr. Alla ENT, Disp: , Rfl:    carvedilol  (COREG ) 12.5 MG tablet, Take 1 tablet (12.5 mg total) by mouth 2 (two) times daily with a meal., Disp: 200 tablet, Rfl: 3   Continuous Glucose Sensor (DEXCOM G7 SENSOR) MISC, USE TO CHECK BLOOD GLUCOSE AS  DIRECTED 3 TIMES DAILY AND  CHANGE SENSOR EVERY 10 DAYS, Disp: 10 each, Rfl: 2   diclofenac Sodium (VOLTAREN) 1 % GEL, apply A SMALL AMOUNT TO involved joints UP TO TWICE DAILY, Disp: , Rfl:    DULoxetine (CYMBALTA) 30 MG capsule, Take 60 mg by mouth 2 (two) times daily., Disp: , Rfl:    Fluticasone  Propionate (XHANCE ) 93 MCG/ACT EXHU, Place 1 spray into the nose in the morning and at bedtime., Disp: 16 mL, Rfl: 2   folic acid  (FOLVITE ) 1 MG tablet, TAKE 1 TABLET BY MOUTH EVERY MORNING, Disp: 30 tablet, Rfl: 0   Glucagon  (GVOKE HYPOPEN   2-PACK) 1 MG/0.2ML SOAJ, Inject 1 mg into the skin as needed., Disp: 0.2 mL, Rfl: 5   hydroxychloroquine  (PLAQUENIL ) 200 MG tablet, Take 1 tablet (200 mg total) by mouth 2 (two) times daily., Disp: 180 tablet, Rfl: 0   insulin  lispro (HUMALOG  KWIKPEN) 100 UNIT/ML KwikPen, INJECT SUBCUTANEOUSLY ACCORDING  TO SLIDING SCALE MAXIMUM DAILY  DOSE: 15 UNITS, Disp: 15 mL, Rfl: 2   Insulin  Pen Needle (NOVOFINE PLUS PEN NEEDLE) 32G X 4 MM MISC, Use with insulin  pens dx code e11.65, Disp: 300 each, Rfl: 3   Ipratropium-Albuterol  (COMBIVENT  RESPIMAT) 20-100 MCG/ACT AERS respimat, Inhale 1 puff into the lungs every 6 (six) hours., Disp: 4 g, Rfl: 5   ipratropium-albuterol  (DUONEB) 0.5-2.5 (3) MG/3ML SOLN, USE 1 VIAL VIA NEBULIZER EVERY 6 HOURS AS NEEDED, Disp: 3 mL, Rfl: 1   Magnesium  400 MG TABS, Take 1 tablet by mouth daily., Disp: 90 tablet, Rfl: 1   methocarbamol (ROBAXIN) 500 MG tablet, Take 500 mg by mouth 4 (four) times daily., Disp: , Rfl:    Methotrexate , PF, 20 MG/0.4ML SOAJ, Inject 20 mg into the skin once a week. Patient taking on Friday's, Disp: , Rfl:    metolazone  (ZAROXOLYN ) 2.5 MG tablet, TAKE 1 TABLET BY MOUTH AS NEEDED FOR WEIGHT GAIN OF 5 POUNDS WITHIN 3 DAYS AS DIRECTED needs appt for further refills, Disp: 5 tablet, Rfl: 0   Multiple Vitamin (MULTIVITAMIN WITH MINERALS) TABS, Take 1 tablet by mouth every morning. , Disp: , Rfl:    ondansetron  (ZOFRAN -ODT) 4 MG disintegrating tablet, 4mg  ODT q4 hours prn nausea/vomit, Disp: 10 tablet, Rfl: 0   Phenylephrine -APAP-guaiFENesin  5-325-200 MG TABS, Take by mouth 2 (two) times daily. Per Dr. Alla ENT, Disp: , Rfl:    potassium chloride  (KLOR-CON  M) 10 MEQ tablet, Take 4 tablets (40 mEq total) by mouth 2 (two) times daily., Disp: , Rfl:    predniSONE  (DELTASONE ) 5 MG tablet, Take 5 mg by mouth daily., Disp: , Rfl:    PROAIR  HFA 108 (90 Base) MCG/ACT inhaler, INHALE 2 PUFFS BY MOUTH EVERY DAY AS NEEDED FOR SHORTNESS OF BREATH, Disp: 18 g, Rfl: 2    sacubitril -valsartan  (ENTRESTO ) 24-26 MG, Take 1 tablet by mouth 2 (two) times daily. Please cancel all previous orders for current medication. Change in dosage or pill size., Disp: 60 tablet, Rfl: 11   spironolactone  (  ALDACTONE ) 25 MG tablet, TAKE 1 TABLET BY MOUTH IN THE  MORNING AND 1 TABLET BY MOUTH IN THE EVENING, Disp: 200 tablet, Rfl: 2   Tocilizumab  (ACTEMRA  ACTPEN) 162 MG/0.9ML SOAJ, Inject 162 mg into the skin once a week. Patient is taking weekly on Fridays for Lupus., Disp: , Rfl:    TRESIBA  FLEXTOUCH 100 UNIT/ML FlexTouch Pen, INJECT SUBCUTANEOUSLY INTO SKIN  10 UNITS DAILY, Disp: 15 mL, Rfl: 2   VEOZAH  45 MG TABS, TAKE 1 TABLET BY MOUTH DAILY, Disp: 30 tablet, Rfl: 3   Vitamin D , Ergocalciferol , (DRISDOL ) 1.25 MG (50000 UNIT) CAPS capsule, Take 1 capsule (50,000 Units total) by mouth 2 (two) times a week., Disp: 12 capsule, Rfl: 3

## 2024-07-09 ENCOUNTER — Telehealth (HOSPITAL_COMMUNITY): Payer: Self-pay

## 2024-07-09 NOTE — Telephone Encounter (Signed)
 Patient was called to give echo results. She asked could she stop her ASA 81 mg due to easy bruising  per her Hematologist. Dr.Bensimhon said from cardiac standpoint she can but she needs to make sure her Rheumatologist is ok with her stopping it

## 2024-07-12 ENCOUNTER — Ambulatory Visit: Attending: Internal Medicine | Admitting: Internal Medicine

## 2024-07-12 ENCOUNTER — Encounter: Payer: Self-pay | Admitting: Internal Medicine

## 2024-07-12 VITALS — BP 126/68 | HR 82 | Ht 66.0 in | Wt 192.2 lb

## 2024-07-12 DIAGNOSIS — I5022 Chronic systolic (congestive) heart failure: Secondary | ICD-10-CM | POA: Diagnosis not present

## 2024-07-12 LAB — CUP PACEART INCLINIC DEVICE CHECK
Date Time Interrogation Session: 20250721171255
HighPow Impedance: 83 Ohm
Implantable Lead Connection Status: 753985
Implantable Lead Implant Date: 20190206
Implantable Lead Location: 753860
Implantable Lead Model: 292
Implantable Lead Serial Number: 438194
Implantable Pulse Generator Implant Date: 20190206
Lead Channel Impedance Value: 576 Ohm
Lead Channel Pacing Threshold Amplitude: 0.6 V
Lead Channel Pacing Threshold Amplitude: 0.7 V
Lead Channel Pacing Threshold Pulse Width: 0.4 ms
Lead Channel Pacing Threshold Pulse Width: 0.4 ms
Lead Channel Sensing Intrinsic Amplitude: 21.9 mV
Lead Channel Setting Pacing Amplitude: 2.5 V
Lead Channel Setting Pacing Pulse Width: 0.4 ms
Lead Channel Setting Sensing Sensitivity: 0.5 mV
Pulse Gen Serial Number: 243572
Zone Setting Status: 755011

## 2024-07-12 NOTE — Progress Notes (Signed)
 HPI Christine Mcdowell returns today for ongoing evaluation and management of her ICD and chronic systolic heart failure. She is a very pleasant 53 year old woman, with a history of the above problems status post ICD insertion. I have not seen her in over 4 years. The patient has done well in the interim with no hospitalizations. She denies any ICD therapies. She denies chest pain or shortness of breath. She has been compliant with her medical therapy. Her recent 2D echo shows an EF of 55%. She has lost weight.  Allergies  Allergen Reactions   Hydrocodone -Acetaminophen  Nausea And Vomiting   Hydromorphone Nausea And Vomiting    Other reaction(s): GI Upset (intolerance), Hypertension (intolerance) Raises blood pressure  Other reaction(s): GI Upset (intolerance), Hypertension (intolerance) Raises blood pressure to stroke level   Iodinated Contrast Media Other (See Comments)    Shuts down kidneys Shuts kidney function down   Other Other (See Comments) and Anaphylaxis    Spicy foods and seasonings Skin Prep makes my skin peel off Paper tape causes skin burns   Erythromycin Nausea And Vomiting   Latex Hives   Rinvoq [Upadacitinib] Other (See Comments)   Farxiga  [Dapagliflozin ] Other (See Comments)   Tape Other (See Comments)    Skin burns   Mircette [Desogestrel-Ethinyl Estradiol] Nausea And Vomiting and Rash     Current Outpatient Medications  Medication Sig Dispense Refill   albuterol  (VENTOLIN  HFA) 108 (90 Base) MCG/ACT inhaler Inhale 2 puffs into the lungs every 6 (six) hours as needed for wheezing or shortness of breath. 8 g 0   aspirin  81 MG chewable tablet Chew 162 mg by mouth daily. Taking 2 tablets     atorvastatin  (LIPITOR) 40 MG tablet Take 1 tablet (40 mg total) by mouth daily. 90 tablet 3   budesonide  (PULMICORT ) 0.5 MG/2ML nebulizer solution Take 0.5 mg by nebulization 2 (two) times daily. Patient is mixing with water and using as a nasal rinse per Dr. McQuirt ENT      carvedilol  (COREG ) 12.5 MG tablet Take 1 tablet (12.5 mg total) by mouth 2 (two) times daily with a meal. 200 tablet 3   Continuous Glucose Sensor (DEXCOM G7 SENSOR) MISC USE TO CHECK BLOOD GLUCOSE AS  DIRECTED 3 TIMES DAILY AND  CHANGE SENSOR EVERY 10 DAYS 10 each 2   diclofenac Sodium (VOLTAREN) 1 % GEL apply A SMALL AMOUNT TO involved joints UP TO TWICE DAILY     DULoxetine (CYMBALTA) 30 MG capsule Take 60 mg by mouth 2 (two) times daily.     Fluticasone  Propionate (XHANCE ) 93 MCG/ACT EXHU Place 1 spray into the nose in the morning and at bedtime. 16 mL 2   folic acid  (FOLVITE ) 1 MG tablet TAKE 1 TABLET BY MOUTH EVERY MORNING 30 tablet 0   Glucagon  (GVOKE HYPOPEN  2-PACK) 1 MG/0.2ML SOAJ Inject 1 mg into the skin as needed. 0.2 mL 5   hydroxychloroquine  (PLAQUENIL ) 200 MG tablet Take 1 tablet (200 mg total) by mouth 2 (two) times daily. 180 tablet 0   insulin  lispro (HUMALOG  KWIKPEN) 100 UNIT/ML KwikPen INJECT SUBCUTANEOUSLY ACCORDING  TO SLIDING SCALE MAXIMUM DAILY  DOSE: 15 UNITS 15 mL 2   Insulin  Pen Needle (NOVOFINE PLUS PEN NEEDLE) 32G X 4 MM MISC Use with insulin  pens dx code e11.65 300 each 3   Ipratropium-Albuterol  (COMBIVENT  RESPIMAT) 20-100 MCG/ACT AERS respimat Inhale 1 puff into the lungs every 6 (six) hours. 4 g 5   ipratropium-albuterol  (DUONEB) 0.5-2.5 (3) MG/3ML SOLN USE  1 VIAL VIA NEBULIZER EVERY 6 HOURS AS NEEDED 3 mL 1   Magnesium  400 MG TABS Take 1 tablet by mouth daily. 90 tablet 1   methocarbamol (ROBAXIN) 500 MG tablet Take 500 mg by mouth 4 (four) times daily.     Methotrexate , PF, 20 MG/0.4ML SOAJ Inject 20 mg into the skin once a week. Patient taking on Friday's     metolazone  (ZAROXOLYN ) 2.5 MG tablet TAKE 1 TABLET BY MOUTH AS NEEDED FOR WEIGHT GAIN OF 5 POUNDS WITHIN 3 DAYS AS DIRECTED needs appt for further refills 5 tablet 0   Multiple Vitamin (MULTIVITAMIN WITH MINERALS) TABS Take 1 tablet by mouth every morning.      ondansetron  (ZOFRAN -ODT) 4 MG disintegrating  tablet 4mg  ODT q4 hours prn nausea/vomit 10 tablet 0   Phenylephrine -APAP-guaiFENesin  5-325-200 MG TABS Take by mouth 2 (two) times daily. Per Dr. Alla ENT     potassium chloride  (KLOR-CON  M) 10 MEQ tablet Take 4 tablets (40 mEq total) by mouth 2 (two) times daily.     predniSONE  (DELTASONE ) 5 MG tablet Take 5 mg by mouth daily.     PROAIR  HFA 108 (90 Base) MCG/ACT inhaler INHALE 2 PUFFS BY MOUTH EVERY DAY AS NEEDED FOR SHORTNESS OF BREATH 18 g 2   sacubitril -valsartan  (ENTRESTO ) 24-26 MG Take 1 tablet by mouth 2 (two) times daily. Please cancel all previous orders for current medication. Change in dosage or pill size. 60 tablet 11   spironolactone  (ALDACTONE ) 25 MG tablet TAKE 1 TABLET BY MOUTH IN THE  MORNING AND 1 TABLET BY MOUTH IN THE EVENING 200 tablet 2   Tocilizumab  (ACTEMRA  ACTPEN) 162 MG/0.9ML SOAJ Inject 162 mg into the skin once a week. Patient is taking weekly on Fridays for Lupus.     TRESIBA  FLEXTOUCH 100 UNIT/ML FlexTouch Pen INJECT SUBCUTANEOUSLY INTO SKIN  10 UNITS DAILY 15 mL 2   VEOZAH  45 MG TABS TAKE 1 TABLET BY MOUTH DAILY 30 tablet 3   Vitamin D , Ergocalciferol , (DRISDOL ) 1.25 MG (50000 UNIT) CAPS capsule Take 1 capsule (50,000 Units total) by mouth 2 (two) times a week. 12 capsule 3   No current facility-administered medications for this visit.     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   Discoid lupus    Fibromyalgia    History of blood transfusion several   related to anemia; had some w/hysterectomy also   Hx of cardiovascular stress test    ETT-Myoview (9/15):  No ischemia, EF 52%; NORMAL   Hx of echocardiogram    Echo (9/15):  EF 50-55%, ant HK, Gr 1 DD, mild MR, mild LAE, no effusion   Hypertension     Iron deficiency anemia    h/o iron transfusions   Lupus (systemic lupus erythematosus) (HCC)    Migraine    a few/year (07/03/2016)   Pneumonia 12/2015   RA (rheumatoid arthritis) (HCC)    all over (07/03/2016)   Sickle cell trait (HCC)    Sleep apnea    Stroke (HCC) 2014 X 1; 2015 X 2; 2016 X 1;    right side of face more relaxed than the other; rare speech hesitation (07/03/2016)   Vaginal Pap smear, abnormal  ASCUS; HPV    ROS:   All systems reviewed and negative except as noted in the HPI.   Past Surgical History:  Procedure Laterality Date   ABDOMINAL HYSTERECTOMY  2009   ABDOMINAL WOUND DEHISCENCE  2009   BUNIONECTOMY Left 06/15/2020   CARDIAC CATHETERIZATION N/A 10/11/2016   Procedure: Right/Left Heart Cath and Coronary Angiography;  Surgeon: Toribio JONELLE Fuel, MD;  Location: [facility] INVASIVE CV LAB;  Service: Cardiovascular;  Laterality: N/A;   DILATION AND CURETTAGE OF UTERUS  1991   EYE SURGERY Bilateral    cataract 2019   HEMATOMA EVACUATION  2009   abdomen   ICD IMPLANT  01/28/2018   ICD IMPLANT N/A 01/28/2018   Procedure: ICD IMPLANT;  Surgeon: Waddell Danelle ORN, MD;  Location: [facility] INVASIVE CV LAB;  Service: Cardiovascular;  Laterality: N/A;   INCISE AND DRAIN ABCESS  2009 X 2   abdomen after hysterectomy   KNEE ARTHROSCOPY Right 1997   KNEE SURGERY Right    NASAL SINUS SURGERY  10/21/2023   TUBAL LIGATION  1996     Family History  Problem Relation Age of Onset   Arthritis Mother    Heart murmur Mother    Drug abuse Mother    Allergies Mother    Heart attack Father    Cushing syndrome Father    Depression Father    Allergies Father    Diabetes Maternal Grandmother    Hypertension Maternal Grandmother    Asthma Maternal Grandmother    Heart attack Maternal Grandfather    Dementia Paternal Grandmother    Cancer Paternal Grandfather    Breast cancer Neg Hx    Colon cancer Neg Hx    Rectal cancer Neg Hx    Stomach cancer Neg Hx       Social History   Socioeconomic History   Marital status: Single    Spouse name: Not on file   Number of children: Not on file   Years of education: Not on file   Highest education level: Some college, no degree  Occupational History   Not on file  Tobacco Use   Smoking status: Never   Smokeless tobacco: Never  Vaping Use   Vaping status: Never Used  Substance and Sexual Activity   Alcohol  use: No   Drug use: No   Sexual activity: Not Currently    Birth control/protection: Surgical    Comment: hyst  Other Topics Concern   Not on file  Social History Narrative   Not on file   Social Drivers of Health   Financial Resource Strain: High Risk (05/20/2024)   Overall Financial Resource Strain (CARDIA)    Difficulty of Paying Living Expenses: Hard  Food Insecurity: Food Insecurity Present (05/20/2024)   Hunger Vital Sign    Worried About Running Out of Food in the Last Year: Often true    Ran Out of Food in the Last Year: Often true  Transportation Needs: No Transportation Needs (05/20/2024)   PRAPARE - Administrator, Civil Service (Medical): No    Lack of Transportation (Non-Medical): No  Recent Concern: Transportation Needs - Unmet Transportation Needs (03/29/2024)   PRAPARE - Transportation    Lack of Transportation (Medical): Yes    Lack of Transportation (Non-Medical): Yes  Physical Activity: Insufficiently Active (05/20/2024)   Exercise Vital Sign    Days of Exercise per Week: 2 days    Minutes of Exercise per Session: 10 min  Stress: No Stress Concern Present (05/20/2024)  [employer] of Occupational Health - Occupational Stress Questionnaire    Feeling of Stress : Not at all  Recent Concern: Stress - Stress Concern Present (03/29/2024)   [employer] of Occupational Health - Occupational Stress Questionnaire    Feeling of Stress : Rather much  Social Connections: Moderately Integrated (05/20/2024)   Social Connection and Isolation Panel     Frequency of Communication with Friends and Family: More than three times a week    Frequency of Social Gatherings with Friends and Family: More than three times a week    Attends Religious Services: More than 4 times per year    Active Member of [employer] or Organizations: Yes    Attends Engineer, structural: More than 4 times per year    Marital Status: Divorced  Intimate Partner Violence: Not At Risk (05/20/2024)   Humiliation, Afraid, Rape, and Kick questionnaire    Fear of Current or Ex-Partner: No    Emotionally Abused: No    Physically Abused: No    Sexually Abused: No     BP 126/68   Pulse 82   Ht 5' 6 (1.676 m)   Wt 192 lb 3.2 oz (87.2 kg)   SpO2 95%   BMI 31.02 kg/m   Physical Exam:  Well appearing NAD HEENT: Unremarkable Neck:  No JVD, no thyromegally Lymphatics:  No adenopathy Back:  No CVA tenderness Lungs:  Clear with no wheezes HEART:  Regular rate rhythm, no murmurs, no rubs, no clicks Abd:  soft, positive bowel sounds, no organomegally, no rebound, no guarding Ext:  2 plus pulses, no edema, no cyanosis, no clubbing Skin:  No rashes no nodules Neuro:  CN II through XII intact, motor grossly intact   DEVICE  Normal device function.  See PaceArt for details.   Assess/Plan: 1.  Chronic systolic heart failure -her symptoms are class II on guideline directed medical therapy.  She is encouraged to maintain a low-sodium diet. Her EF has normalized.  2.  ICD -her [employer] single-chamber ICD is working normally.  She has had no ICD therapy. Her current device has 9 years of battery longevity and has been in place 6.5 years. 3.  Hypertension -her blood pressure is well controlled.  She will continue her current medical therapy.   Danelle Birmingham, MD

## 2024-07-12 NOTE — Patient Instructions (Signed)

## 2024-07-13 DIAGNOSIS — E119 Type 2 diabetes mellitus without complications: Secondary | ICD-10-CM | POA: Diagnosis not present

## 2024-07-13 DIAGNOSIS — Z794 Long term (current) use of insulin: Secondary | ICD-10-CM | POA: Diagnosis not present

## 2024-07-16 ENCOUNTER — Encounter (HOSPITAL_COMMUNITY): Payer: Self-pay | Admitting: Internal Medicine

## 2024-07-19 NOTE — Addendum Note (Signed)
 Addended by: VICCI SELLER A on: 07/19/2024 03:35 PM   Modules accepted: Orders

## 2024-07-19 NOTE — Progress Notes (Signed)
 Remote ICD transmission.

## 2024-07-20 DIAGNOSIS — J329 Chronic sinusitis, unspecified: Secondary | ICD-10-CM | POA: Diagnosis not present

## 2024-07-23 ENCOUNTER — Other Ambulatory Visit: Payer: Self-pay

## 2024-07-23 MED ORDER — DEXCOM G7 SENSOR MISC: 2 refills | Status: AC

## 2024-08-02 DIAGNOSIS — M62838 Other muscle spasm: Secondary | ICD-10-CM | POA: Diagnosis not present

## 2024-08-02 DIAGNOSIS — R2689 Other abnormalities of gait and mobility: Secondary | ICD-10-CM | POA: Diagnosis not present

## 2024-08-02 DIAGNOSIS — R29898 Other symptoms and signs involving the musculoskeletal system: Secondary | ICD-10-CM | POA: Diagnosis not present

## 2024-08-03 ENCOUNTER — Other Ambulatory Visit (HOSPITAL_COMMUNITY): Payer: Self-pay

## 2024-08-03 ENCOUNTER — Telehealth: Payer: Self-pay

## 2024-08-03 DIAGNOSIS — I5022 Chronic systolic (congestive) heart failure: Secondary | ICD-10-CM

## 2024-08-03 MED ORDER — SACUBITRIL-VALSARTAN 24-26 MG PO TABS: 1.0000 | ORAL_TABLET | Freq: Two times a day (BID) | ORAL | 11 refills | Status: AC

## 2024-08-09 ENCOUNTER — Encounter: Payer: Self-pay | Admitting: Nurse Practitioner

## 2024-08-10 ENCOUNTER — Encounter: Payer: Self-pay | Admitting: Nurse Practitioner

## 2024-08-11 ENCOUNTER — Other Ambulatory Visit: Payer: Self-pay

## 2024-08-11 MED ORDER — ACCU-CHEK GUIDE ME W/DEVICE KIT: PACK | 0 refills | Status: DC

## 2024-08-11 MED ORDER — ACCU-CHEK SOFTCLIX LANCETS MISC: 12 refills | Status: DC

## 2024-08-11 MED ORDER — ACCU-CHEK GUIDE TEST VI STRP: ORAL_STRIP | 12 refills | Status: DC

## 2024-08-17 ENCOUNTER — Telehealth: Payer: Self-pay

## 2024-08-18 ENCOUNTER — Encounter: Payer: Self-pay | Admitting: Nurse Practitioner

## 2024-08-18 ENCOUNTER — Other Ambulatory Visit: Payer: Self-pay

## 2024-08-18 NOTE — Patient Outreach (Signed)
 Complex Care Management   Visit Note  08/18/2024  Name:  Christine Mcdowell MRN: [id number] DOB: [birth year]  Situation: Referral received for Complex Care Management related to Heart Failure, Diabetes with Complications, and Hypertensive Heart Disease with chronic Systolic Heart Failure, Chronic pain secondary to RA/Lupus, Chronic Sinusitis. I obtained verbal consent from Patient.  Visit completed with Patient on the phone.   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   Discoid lupus    Fibromyalgia    History of blood transfusion several   related to anemia; had some w/hysterectomy also   Hx of cardiovascular stress test    ETT-Myoview (9/15):  No ischemia, EF 52%; NORMAL   Hx of echocardiogram    Echo (9/15):  EF 50-55%, ant HK, Gr 1 DD, mild MR, mild LAE, no effusion   Hypertension    Iron deficiency anemia    h/o iron transfusions   Lupus (systemic lupus erythematosus) (HCC)    Migraine    a few/year (07/03/2016)   Pneumonia 12/2015   RA (rheumatoid arthritis) (HCC)    all over (07/03/2016)   Sickle cell trait (HCC)    Sleep apnea    Stroke (HCC) 2014 X 1; 2015 X 2; 2016 X 1;    right side of face more relaxed than the other; rare speech hesitation (07/03/2016)   Vaginal Pap smear, abnormal    ASCUS; HPV    Assessment: Patient Reported Symptoms:  Cognitive Cognitive Status: Alert and oriented to person, place, and time, Normal speech and language skills Cognitive/Intellectual Conditions Management [RPT]: None reported or documented in medical history or problem list   Health Maintenance Behaviors: Annual physical exam, Healthy diet, Social activities Healing Pattern: Average Health Facilitated  by: Healthy diet, Pain control, Rest  Neurological Neurological Review of Symptoms: Not assessed    HEENT HEENT Symptoms Reported: Other: (chronic sinusitis) HEENT Management Strategies: Medication therapy, Routine screening HEENT Self-Management Outcome: 3 (uncertain)    Cardiovascular Cardiovascular Symptoms Reported: Other: (Hypotension) Does patient have uncontrolled Hypertension?: No Cardiovascular Management Strategies: Adequate rest, Medication therapy, Routine screening, Diet modification Cardiovascular Self-Management Outcome: 4 (good)  Respiratory Respiratory Symptoms Reported: No symptoms reported    Endocrine Endocrine Symptoms Reported: No symptoms reported Is patient diabetic?: Yes Is patient checking blood sugars at home?: Yes Endocrine Self-Management Outcome: 4 (good)  Gastrointestinal Gastrointestinal Symptoms Reported: Not assessed      Genitourinary Genitourinary Symptoms Reported: Not assessed    Integumentary Integumentary Symptoms Reported: Not assessed    Musculoskeletal Musculoskelatal Symptoms Reviewed: Difficulty walking, Limited mobility, Back pain Musculoskeletal Management Strategies: Adequate rest, Exercise, Medication therapy, Routine screening Musculoskeletal Self-Management Outcome: 4 (good)      Psychosocial Psychosocial Symptoms Reported: No symptoms reported     Quality of Family Relationships: involved, supportive, stressful Do you feel physically threatened by others?: No    08/18/2024    PHQ2-9 Depression Screening   Christine Mcdowell interest or pleasure in doing things    Feeling down, depressed, or hopeless    PHQ-2 - Total Score    Trouble falling or staying asleep, or sleeping too much    Feeling tired or having Christine Mcdowell energy    Poor appetite or overeating  Feeling bad about yourself - or that you are a failure or have let yourself or your family down    Trouble concentrating on things, such as reading the newspaper or watching  television    Moving or speaking so slowly that other people could have noticed.  Or the opposite - being so fidgety or restless that you have been moving around a lot more than usual    Thoughts that you would be better off dead, or hurting yourself in some way    PHQ2-9 Total Score    If you checked off any problems, how difficult have these problems made it for you to do your work, take care of things at home, or get along with other people    Depression Interventions/Treatment      There were no vitals filed for this visit.  Medications Reviewed Today     Reviewed by Christine Clayborne CROME, RN (Registered Nurse) on 08/18/24 at 1208  Med List Status: <None>   Medication Order Taking? Sig Documenting Provider Last Dose Status Informant  Accu-Chek Softclix Lancets lancets [id number]  Use as instructed Christine Speaks, FNP  Active   albuterol  (VENTOLIN  HFA) 108 (90 Base) MCG/ACT inhaler [id number]  Inhale 2 puffs into the lungs every 6 (six) hours as needed for wheezing or shortness of breath. Christine Domino, FNP  Active   aspirin  81 MG chewable tablet [id number]  Chew 162 mg by mouth daily. Taking 2 tablets [provider]  Active Self  atorvastatin  (LIPITOR) 40 MG tablet [id number]  Take 1 tablet (40 mg total) by mouth daily. Christine Speaks, FNP  Active   Blood Glucose Monitoring Suppl (ACCU-CHEK GUIDE ME) w/Device KIT [id number]  Use as directed to check blood sugars. Christine Speaks, FNP  Active   budesonide  (PULMICORT ) 0.5 MG/2ML nebulizer solution [id number]  Take 0.5 mg by nebulization 2 (two) times daily. Patient is mixing with water and using as a nasal rinse per Dr. Alla ENT [provider]  Active   carvedilol  (COREG ) 12.5 MG tablet [id number]  Take 1 tablet (12.5 mg total) by mouth 2 (two) times daily with a meal. Christine Manuelita Garre, PA-C  Active   Continuous Glucose Sensor (DEXCOM G7 SENSOR) MISC [id number]  USE TO CHECK BLOOD GLUCOSE AS  DIRECTED 3 TIMES DAILY AND  CHANGE  SENSOR EVERY 10 DAYS Christine Speaks, FNP  Active   diclofenac Sodium (VOLTAREN) 1 % GEL [id number]  apply A SMALL AMOUNT TO involved joints UP TO TWICE DAILY [provider]  Active   DULoxetine (CYMBALTA) 30 MG capsule [id number]  Take 60 mg by mouth 2 (two) times daily. [provider]  Active   Fluticasone  Propionate (XHANCE ) 93 MCG/ACT EXHU [id number]  Place 1 spray into the nose in the morning and at bedtime. Christine Speaks, FNP  Active   folic acid  (FOLVITE ) 1 MG tablet [id number]  TAKE 1 TABLET BY MOUTH EVERY MORNING Bensimhon, Toribio SAUNDERS, MD  Active Self  Glucagon  (GVOKE HYPOPEN  2-PACK) 1 MG/0.2ML SOAJ [id number]  Inject 1 mg into the skin as needed. Christine Speaks, FNP  Active   glucose blood (ACCU-CHEK GUIDE TEST) test strip [id number]  Use as instructed Christine Speaks, FNP  Active   hydroxychloroquine  (PLAQUENIL ) 200 MG tablet [id number]  Take 1 tablet (200 mg total) by mouth 2 (two) times daily. Bensimhon, Daniel R, MD  Active   insulin  lispro (HUMALOG  KWIKPEN) 100 UNIT/ML KwikPen [id number]  INJECT SUBCUTANEOUSLY ACCORDING  TO SLIDING SCALE MAXIMUM DAILY  DOSE: 15 UNITS  Christine Speaks, FNP  Active   Insulin  Pen Needle (NOVOFINE PLUS PEN NEEDLE) 32G X 4 MM MISC [id number]  Use with insulin  pens dx code e11.65 Christine Speaks, FNP  Active   Ipratropium-Albuterol  (COMBIVENT  RESPIMAT) 20-100 MCG/ACT AERS respimat [id number]  Inhale 1 puff into the lungs every 6 (six) hours. Christine Speaks, FNP  Active   ipratropium-albuterol  (DUONEB) 0.5-2.5 (3) MG/3ML SOLN [id number]  USE 1 VIAL VIA NEBULIZER EVERY 6 HOURS AS NEEDED Christine Speaks, FNP  Active   Magnesium  400 MG TABS [id number]  Take 1 tablet by mouth daily. Christine Speaks, FNP  Active   methocarbamol (ROBAXIN) 500 MG tablet [id number]  Take 500 mg by mouth 4 (four) times daily. [provider]  Active   Methotrexate , PF, 20 MG/0.4ML SOAJ [id number]  Inject 20 mg into the skin once a week. Patient taking on Friday's [provider]   Active   metolazone  (ZAROXOLYN ) 2.5 MG tablet [id number]  TAKE 1 TABLET BY MOUTH AS NEEDED FOR WEIGHT GAIN OF 5 POUNDS WITHIN 3 DAYS AS DIRECTED needs appt for further refills Bensimhon, Daniel R, MD  Active   Multiple Vitamin (MULTIVITAMIN WITH MINERALS) TABS [id number]  Take 1 tablet by mouth every morning.  [provider]  Active Self  ondansetron  (ZOFRAN -ODT) 4 MG disintegrating tablet [id number]  4mg  ODT q4 hours prn nausea/vomit Zammit, Joseph, MD  Active   Phenylephrine -APAP-guaiFENesin  5-325-200 MG TABS [id number]  Take by mouth 2 (two) times daily. Per Dr. Alla ENT [provider]  Active   potassium chloride  (KLOR-CON  M) 10 MEQ tablet [id number]  Take 4 tablets (40 mEq total) by mouth 2 (two) times daily. Christine Manuelita Garre, PA-C  Active   predniSONE  (DELTASONE ) 5 MG tablet [id number]  Take 5 mg by mouth daily. [provider]  Active   PROAIR  HFA 108 (90 Base) MCG/ACT inhaler [id number]  INHALE 2 PUFFS BY MOUTH EVERY DAY AS NEEDED FOR SHORTNESS OF SHERIDA Christine Speaks, FNP  Active   sacubitril -valsartan  (ENTRESTO ) 24-26 MG [id number]  Take 1 tablet by mouth 2 (two) times daily. Please cancel all previous orders for current medication. Change in dosage or pill size. Bensimhon, Toribio SAUNDERS, MD  Active   spironolactone  (ALDACTONE ) 25 MG tablet [id number]  TAKE 1 TABLET BY MOUTH IN THE  MORNING AND 1 TABLET BY MOUTH IN THE EVENING Bensimhon, Toribio SAUNDERS, MD  Active   Tocilizumab  (ACTEMRA  ACTPEN) 162 MG/0.9ML SOAJ [id number]  Inject 162 mg into the skin once a week. Patient is taking weekly on Fridays for Lupus. [provider]  Active   TRESIBA  FLEXTOUCH 100 UNIT/ML FlexTouch Pen [id number]  INJECT SUBCUTANEOUSLY INTO SKIN  10 UNITS DAILY Moore, Janece, FNP  Active   VEOZAH  45 MG TABS [id number]  TAKE 1 TABLET BY MOUTH DAILY Christine Speaks, FNP  Active   Vitamin D , Ergocalciferol , (DRISDOL ) 1.25 MG (50000 UNIT) CAPS capsule [id number]  Take 1 capsule (50,000 Units total) by  mouth 2 (two) times a week. Christine Speaks, FNP  Active             Recommendation:   PCP Follow-up Continue Current Plan of Care  Follow Up Plan:   Telephone follow up appointment date/time:  Friday, October 3 at 1:00 PM  Clayborne Ly RN BSN CCM [place]  [facility], [facility] Nurse Care Coordinator  Direct Dial : [phone] Website: [email or web address]

## 2024-08-18 NOTE — Patient Instructions (Signed)
 Visit Information  Thank you for taking time to visit with me today. Please don't hesitate to contact me if I can be of assistance to you before our next scheduled appointment.  Your next care management appointment is by telephone on Friday, October 3 at 1:00 PM  Please call the care guide team at [phone] if you need to cancel, schedule, or reschedule an appointment.   Please call 1-800-273-TALK (toll free, 24 hour hotline) if you are experiencing a Mental Health or Behavioral Health Crisis or need someone to talk to.  Clayborne Ly RN BSN CCM [place]  [facility], [facility] Health Nurse Care Coordinator  Direct Dial : [phone] Website: [email or web address]

## 2024-08-20 DIAGNOSIS — M3210 Systemic lupus erythematosus, organ or system involvement unspecified: Secondary | ICD-10-CM | POA: Diagnosis not present

## 2024-08-20 DIAGNOSIS — M0560 Rheumatoid arthritis of unspecified site with involvement of other organs and systems: Secondary | ICD-10-CM | POA: Diagnosis not present

## 2024-08-20 DIAGNOSIS — Z79899 Other long term (current) drug therapy: Secondary | ICD-10-CM | POA: Diagnosis not present

## 2024-08-24 ENCOUNTER — Other Ambulatory Visit: Payer: Self-pay | Admitting: Nurse Practitioner

## 2024-08-24 DIAGNOSIS — N951 Menopausal and female climacteric states: Secondary | ICD-10-CM

## 2024-08-25 ENCOUNTER — Ambulatory Visit

## 2024-08-26 ENCOUNTER — Telehealth

## 2024-08-27 DIAGNOSIS — M62838 Other muscle spasm: Secondary | ICD-10-CM | POA: Diagnosis not present

## 2024-08-27 DIAGNOSIS — R2689 Other abnormalities of gait and mobility: Secondary | ICD-10-CM | POA: Diagnosis not present

## 2024-08-27 DIAGNOSIS — R29898 Other symptoms and signs involving the musculoskeletal system: Secondary | ICD-10-CM | POA: Diagnosis not present

## 2024-09-01 ENCOUNTER — Ambulatory Visit

## 2024-09-01 DIAGNOSIS — I420 Dilated cardiomyopathy: Secondary | ICD-10-CM | POA: Diagnosis not present

## 2024-09-02 LAB — CUP PACEART REMOTE DEVICE CHECK
Battery Remaining Longevity: 108 mo
Battery Remaining Percentage: 76 %
Brady Statistic RV Percent Paced: 0 %
Date Time Interrogation Session: 20250910085300
HighPow Impedance: 74 Ohm
Implantable Lead Connection Status: 753985
Implantable Lead Implant Date: 20190206
Implantable Lead Location: 753860
Implantable Lead Model: 292
Implantable Lead Serial Number: 438194
Implantable Pulse Generator Implant Date: 20190206
Lead Channel Impedance Value: 526 Ohm
Lead Channel Setting Pacing Amplitude: 2.5 V
Lead Channel Setting Pacing Pulse Width: 0.4 ms
Lead Channel Setting Sensing Sensitivity: 0.5 mV
Pulse Gen Serial Number: 243572
Zone Setting Status: 755011

## 2024-09-03 DIAGNOSIS — M62838 Other muscle spasm: Secondary | ICD-10-CM | POA: Diagnosis not present

## 2024-09-03 DIAGNOSIS — R29898 Other symptoms and signs involving the musculoskeletal system: Secondary | ICD-10-CM | POA: Diagnosis not present

## 2024-09-03 DIAGNOSIS — R2689 Other abnormalities of gait and mobility: Secondary | ICD-10-CM | POA: Diagnosis not present

## 2024-09-06 DIAGNOSIS — M0579 Rheumatoid arthritis with rheumatoid factor of multiple sites without organ or systems involvement: Secondary | ICD-10-CM | POA: Diagnosis not present

## 2024-09-06 DIAGNOSIS — E559 Vitamin D deficiency, unspecified: Secondary | ICD-10-CM | POA: Diagnosis not present

## 2024-09-06 DIAGNOSIS — M329 Systemic lupus erythematosus, unspecified: Secondary | ICD-10-CM | POA: Diagnosis not present

## 2024-09-06 DIAGNOSIS — Z79899 Other long term (current) drug therapy: Secondary | ICD-10-CM | POA: Diagnosis not present

## 2024-09-10 ENCOUNTER — Ambulatory Visit: Payer: Self-pay | Admitting: Internal Medicine

## 2024-09-10 NOTE — Progress Notes (Signed)
Remote ICD Transmission.

## 2024-09-14 ENCOUNTER — Other Ambulatory Visit (HOSPITAL_COMMUNITY): Payer: Self-pay | Admitting: Internal Medicine

## 2024-09-21 DIAGNOSIS — M1711 Unilateral primary osteoarthritis, right knee: Secondary | ICD-10-CM | POA: Diagnosis not present

## 2024-09-21 DIAGNOSIS — M0579 Rheumatoid arthritis with rheumatoid factor of multiple sites without organ or systems involvement: Secondary | ICD-10-CM | POA: Diagnosis not present

## 2024-09-21 DIAGNOSIS — M329 Systemic lupus erythematosus, unspecified: Secondary | ICD-10-CM | POA: Diagnosis not present

## 2024-09-23 DIAGNOSIS — M351 Other overlap syndromes: Secondary | ICD-10-CM | POA: Diagnosis not present

## 2024-09-23 DIAGNOSIS — M329 Systemic lupus erythematosus, unspecified: Secondary | ICD-10-CM | POA: Diagnosis not present

## 2024-09-23 DIAGNOSIS — D688 Other specified coagulation defects: Secondary | ICD-10-CM | POA: Diagnosis not present

## 2024-09-23 DIAGNOSIS — J329 Chronic sinusitis, unspecified: Secondary | ICD-10-CM | POA: Diagnosis not present

## 2024-09-24 ENCOUNTER — Telehealth: Payer: Self-pay

## 2024-09-24 NOTE — Patient Instructions (Signed)
 Christine Mcdowell - I am sorry we were unable to complete our scheduled appointment. I have rescheduled our nurse follow up call. Please note that I will call you on Tuesday, October 21 at 09:15 AM. Please let me know if this date/time does not work for you and I will be happy to accommodate your schedule needs. I hope things are going well for you and I look forward to speaking with you soon.   Thank you,   Clayborne Ly RN BSN CCM [place]  [facility], [facility] Health Nurse Care Coordinator  Direct Dial : [phone] Website: [email or web address]

## 2024-10-07 DIAGNOSIS — Z79899 Other long term (current) drug therapy: Secondary | ICD-10-CM | POA: Diagnosis not present

## 2024-10-07 DIAGNOSIS — M329 Systemic lupus erythematosus, unspecified: Secondary | ICD-10-CM | POA: Diagnosis not present

## 2024-10-07 DIAGNOSIS — M0579 Rheumatoid arthritis with rheumatoid factor of multiple sites without organ or systems involvement: Secondary | ICD-10-CM | POA: Diagnosis not present

## 2024-10-08 DIAGNOSIS — J329 Chronic sinusitis, unspecified: Secondary | ICD-10-CM | POA: Diagnosis not present

## 2024-10-12 ENCOUNTER — Other Ambulatory Visit: Payer: Self-pay

## 2024-10-12 NOTE — Patient Instructions (Signed)
 Visit Information  Thank you for taking time to visit with me today. Please don't hesitate to contact me if I can be of assistance to you before our next scheduled appointment.  Your next care management appointment is by telephone on Tuesday, November 11 at 10:00 AM  Please call the care guide team at [phone] if you need to cancel, schedule, or reschedule an appointment.   Please call 1-800-273-TALK (toll free, 24 hour hotline) if you are experiencing a Mental Health or Behavioral Health Crisis or need someone to talk to.  Clayborne Ly RN BSN CCM [place]  [facility], [facility] Nurse Care Coordinator  Direct Dial : [phone] Website: [email or web address]

## 2024-10-12 NOTE — Patient Outreach (Signed)
 Complex Care Management   Visit Note  10/12/2024  Name:  Christine Mcdowell MRN: [id number] DOB: [birth year]  Situation: Referral received for Complex Care Management related to Heart Failure, Diabetes with Complications, and Hypertensive Heart Disease with chronic Systolic Heart Failure, Chronic pain secondary to RA/Lupus, Chronic Sinusitis. I obtained verbal consent from Patient.  Visit completed with Patient on the phone.  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   Discoid lupus    Fibromyalgia    History of blood transfusion several   related to anemia; had some w/hysterectomy also   Hx of cardiovascular stress test    ETT-Myoview (9/15):  No ischemia, EF 52%; NORMAL   Hx of echocardiogram    Echo (9/15):  EF 50-55%, ant HK, Gr 1 DD, mild MR, mild LAE, no effusion   Hypertension    Iron deficiency anemia    h/o iron transfusions   Lupus (systemic lupus erythematosus) (HCC)    Migraine    a few/year (07/03/2016)   Pneumonia 12/2015   RA (rheumatoid arthritis) (HCC)    all over (07/03/2016)   Sickle cell trait    Sleep apnea    Stroke (HCC) 2014 X 1; 2015 X 2; 2016 X 1;    right side of face more relaxed than the other; rare speech hesitation (07/03/2016)   Vaginal Pap smear, abnormal    ASCUS; HPV    Assessment: Patient Reported Symptoms:  Cognitive Cognitive Status: Alert and oriented to person, place, and time, Normal speech and language skills      Neurological Neurological Review of Symptoms: Not assessed    HEENT HEENT Symptoms Reported: Nasal discharge, Other: (chronic sinusitis) HEENT Management Strategies: Medication therapy, Routine screening HEENT Self-Management Outcome: 3  (uncertain) HEENT Comment: patient is scheduled for sinus surgery on 10/22/24 per Dr. Alla with [facility]    Cardiovascular Cardiovascular Symptoms Reported: Not assessed    Respiratory Respiratory Symptoms Reported: Not assesed    Endocrine Endocrine Symptoms Reported: No symptoms reported Is patient diabetic?: Yes Is patient checking blood sugars at home?: Yes List most recent blood sugar readings, include date and time of day: not provided today Endocrine Self-Management Outcome: 3 (uncertain)  Gastrointestinal Gastrointestinal Symptoms Reported: Not assessed      Genitourinary Genitourinary Symptoms Reported: Not assessed    Integumentary Integumentary Symptoms Reported: Not assessed    Musculoskeletal Musculoskelatal Symptoms Reviewed: Not assessed        Psychosocial Psychosocial Symptoms Reported: No symptoms reported   Major Change/Loss/Stressor/Fears (CP): Medical condition, self Techniques to Cope with Loss/Stress/Change: Diversional activities, Support group Quality of Family Relationships: helpful, involved, supportive Do you feel physically threatened by others?: No    10/12/2024    PHQ2-9 Depression Screening   Kourtney Terriquez interest or pleasure in doing things    Feeling down, depressed, or hopeless    PHQ-2 - Total Score    Trouble falling or staying asleep, or sleeping too much    Feeling tired or having Evren Shankland energy    Poor appetite or overeating     Feeling bad about yourself - or that you are a failure or have let yourself or your family down    Trouble concentrating on things, such  as reading the newspaper or watching television    Moving or speaking so slowly that other people could have noticed.  Or the opposite - being so fidgety or restless that you have been moving around a lot more than usual    Thoughts that you would be better off dead, or hurting yourself in some way    PHQ2-9 Total Score    If you checked off any problems, how difficult have these  problems made it for you to do your work, take care of things at home, or get along with other people    Depression Interventions/Treatment      There were no vitals filed for this visit.  Medications Reviewed Today     Reviewed by Morgan Clayborne CROME, RN (Registered Nurse) on 10/12/24 at [phone]  Med List Status: <None>   Medication Order Taking? Sig Documenting Provider Last Dose Status Informant  Accu-Chek Softclix Lancets lancets [id number]  Use as instructed Georgina Speaks, FNP  Active   albuterol  (VENTOLIN  HFA) 108 (90 Base) MCG/ACT inhaler [id number]  Inhale 2 puffs into the lungs every 6 (six) hours as needed for wheezing or shortness of breath. Kennyth Domino, FNP  Active   aspirin  81 MG chewable tablet [id number]  Chew 162 mg by mouth daily. Taking 2 tablets [provider]  Active Self  atorvastatin  (LIPITOR) 40 MG tablet [id number]  Take 1 tablet (40 mg total) by mouth daily. Georgina Speaks, FNP  Active   Blood Glucose Monitoring Suppl (ACCU-CHEK GUIDE ME) w/Device KIT [id number]  Use as directed to check blood sugars. Georgina Speaks, FNP  Active   budesonide  (PULMICORT ) 0.5 MG/2ML nebulizer solution [id number]  Take 0.5 mg by nebulization 2 (two) times daily. Patient is mixing with water and using as a nasal rinse per Dr. Alla ENT [provider]  Active   carvedilol  (COREG ) 12.5 MG tablet [id number]  Take 1 tablet (12.5 mg total) by mouth 2 (two) times daily with a meal. Colletta Manuelita Garre, PA-C  Active   Continuous Glucose Sensor (DEXCOM G7 SENSOR) MISC [id number]  USE TO CHECK BLOOD GLUCOSE AS  DIRECTED 3 TIMES DAILY AND  CHANGE SENSOR EVERY 10 DAYS Georgina Speaks, FNP  Active   diclofenac Sodium (VOLTAREN) 1 % GEL [id number]  apply A SMALL AMOUNT TO involved joints UP TO TWICE DAILY [provider]  Active   DULoxetine (CYMBALTA) 30 MG capsule [id number]  Take 60 mg by mouth 2 (two) times daily. [provider]  Active   Fezolinetant  (VEOZAH ) 45 MG TABS  [id number]  TAKE 1 TABLET BY MOUTH DAILY Georgina Speaks, FNP  Active   Fluticasone  Propionate (XHANCE ) 93 MCG/ACT EXHU [id number]  Place 1 spray into the nose in the morning and at bedtime. Georgina Speaks, FNP  Active   folic acid  (FOLVITE ) 1 MG tablet [id number]  TAKE 1 TABLET BY MOUTH EVERY MORNING Bensimhon, Toribio SAUNDERS, MD  Active Self  Glucagon  (GVOKE HYPOPEN  2-PACK) 1 MG/0.2ML SOAJ [id number]  Inject 1 mg into the skin as needed. Georgina Speaks, FNP  Active   glucose blood (ACCU-CHEK GUIDE TEST) test strip [id number]  Use as instructed Georgina Speaks, FNP  Active   hydroxychloroquine  (PLAQUENIL ) 200 MG tablet [id number]  Take 1 tablet (200 mg total) by mouth 2 (two) times daily. Bensimhon, Daniel R, MD  Active   insulin  lispro (HUMALOG  KWIKPEN) 100 UNIT/ML KwikPen [id number]  INJECT SUBCUTANEOUSLY ACCORDING  TO SLIDING SCALE MAXIMUM DAILY  DOSE: 15 UNITS Georgina Speaks, FNP  Active  Insulin  Pen Needle (NOVOFINE PLUS PEN NEEDLE) 32G X 4 MM MISC [id number]  Use with insulin  pens dx code e11.65 Georgina Speaks, FNP  Active   Ipratropium-Albuterol  (COMBIVENT  RESPIMAT) 20-100 MCG/ACT AERS respimat [id number]  Inhale 1 puff into the lungs every 6 (six) hours. Georgina Speaks, FNP  Active   ipratropium-albuterol  (DUONEB) 0.5-2.5 (3) MG/3ML SOLN [id number]  USE 1 VIAL VIA NEBULIZER EVERY 6 HOURS AS NEEDED Georgina Speaks, FNP  Active   Magnesium  400 MG TABS [id number]  Take 1 tablet by mouth daily. Georgina Speaks, FNP  Active   methocarbamol (ROBAXIN) 500 MG tablet [id number]  Take 500 mg by mouth 4 (four) times daily. [provider]  Active   Methotrexate , PF, 20 MG/0.4ML SOAJ [id number]  Inject 20 mg into the skin once a week. Patient taking on Friday's [provider]  Active   metolazone  (ZAROXOLYN ) 2.5 MG tablet [id number]  TAKE 1 TABLET BY MOUTH AS NEEDED FOR WEIGHT GAIN OF 5 POUNDS WITHIN 3 DAYS AS DIRECTED needs appt for further refills Bensimhon, Daniel R, MD  Active   Multiple Vitamin (MULTIVITAMIN WITH  MINERALS) TABS [id number]  Take 1 tablet by mouth every morning.  [provider]  Active Self  ondansetron  (ZOFRAN -ODT) 4 MG disintegrating tablet [id number]  4mg  ODT q4 hours prn nausea/vomit Zammit, Joseph, MD  Active   Phenylephrine -APAP-guaiFENesin  5-325-200 MG TABS [id number]  Take by mouth 2 (two) times daily. Per Dr. Alla ENT [provider]  Active   potassium chloride  (KLOR-CON  M) 10 MEQ tablet [id number]  Take 4 tablets (40 mEq total) by mouth 2 (two) times daily. Colletta Manuelita Garre, PA-C  Active   predniSONE  (DELTASONE ) 5 MG tablet [id number]  Take 5 mg by mouth daily. [provider]  Active   PROAIR  HFA 108 (90 Base) MCG/ACT inhaler [id number]  INHALE 2 PUFFS BY MOUTH EVERY DAY AS NEEDED FOR SHORTNESS OF SHERIDA Georgina Speaks, FNP  Active   sacubitril -valsartan  (ENTRESTO ) 24-26 MG [id number]  Take 1 tablet by mouth 2 (two) times daily. Please cancel all previous orders for current medication. Change in dosage or pill size. Bensimhon, Toribio SAUNDERS, MD  Active   spironolactone  (ALDACTONE ) 25 MG tablet [id number]  TAKE 1 TABLET BY MOUTH IN THE  MORNING AND 1 TABLET BY MOUTH IN THE EVENING Bensimhon, Toribio SAUNDERS, MD  Active   Tocilizumab  (ACTEMRA  ACTPEN) 162 MG/0.9ML SOAJ [id number]  Inject 162 mg into the skin once a week. Patient is taking weekly on Fridays for Lupus. [provider]  Active   TRESIBA  FLEXTOUCH 100 UNIT/ML FlexTouch Pen [id number]  INJECT SUBCUTANEOUSLY INTO SKIN  10 UNITS DAILY Moore, Janece, FNP  Active   Vitamin D , Ergocalciferol , (DRISDOL ) 1.25 MG (50000 UNIT) CAPS capsule [id number] No Take 1 capsule (50,000 Units total) by mouth 2 (two) times a week. Georgina Speaks, FNP Taking Active             Recommendation:   Specialty provider follow-up with Dr. Elsie Alla on 10/22/24 for bilateral sinus surgery as directed   Follow Up Plan:   Telephone follow up appointment date/time:  Tuesday, November 11 at 10:00 AM  Clayborne Ly RN BSN  CCM [place]  [facility], [facility] Nurse Care Coordinator  Direct Dial : [phone] Website: [email or web address]

## 2024-10-14 ENCOUNTER — Other Ambulatory Visit: Payer: Self-pay | Admitting: Medical Genetics

## 2024-10-14 DIAGNOSIS — Z006 Encounter for examination for normal comparison and control in clinical research program: Secondary | ICD-10-CM

## 2024-10-18 ENCOUNTER — Other Ambulatory Visit: Payer: Self-pay | Admitting: Nurse Practitioner

## 2024-10-21 DIAGNOSIS — Z794 Long term (current) use of insulin: Secondary | ICD-10-CM | POA: Diagnosis not present

## 2024-10-21 DIAGNOSIS — E119 Type 2 diabetes mellitus without complications: Secondary | ICD-10-CM | POA: Diagnosis not present

## 2024-11-01 ENCOUNTER — Encounter: Payer: Self-pay | Admitting: Nurse Practitioner

## 2024-11-02 ENCOUNTER — Other Ambulatory Visit: Payer: Self-pay

## 2024-11-02 NOTE — Patient Instructions (Signed)
 Visit Information  Thank you for taking time to visit with me today. Please don't hesitate to contact me if I can be of assistance to you before our next scheduled appointment.  Your next care management appointment is by telephone on Monday, December 10 at 09:30 AM  Please call the care guide team at [phone] if you need to cancel, schedule, or reschedule an appointment.   Please call 1-800-273-TALK (toll free, 24 hour hotline) if you are experiencing a Mental Health or Behavioral Health Crisis or need someone to talk to.  Clayborne Ly RN BSN CCM   [facility], [facility] Nurse Care Coordinator  Direct Dial : [phone] Website: [email or web address]

## 2024-11-02 NOTE — Patient Outreach (Signed)
 Complex Care Management   Visit Note  11/02/2024  Name:  Christine Mcdowell MRN: [id number] DOB: [birth year]  Situation: Referral received for Complex Care Management related to Heart Failure, Diabetes with Complications, and Hypertensive Heart Disease with chronic Systolic Heart Failure, Chronic pain secondary to RA/Lupus, Chronic Sinusitis. I obtained verbal consent from Patient.  Visit completed with Patient on the phone.  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   Discoid lupus    Fibromyalgia    History of blood transfusion several   related to anemia; had some w/hysterectomy also   Hx of cardiovascular stress test    ETT-Myoview (9/15):  No ischemia, EF 52%; NORMAL   Hx of echocardiogram    Echo (9/15):  EF 50-55%, ant HK, Gr 1 DD, mild MR, mild LAE, no effusion   Hypertension    Iron deficiency anemia    h/o iron transfusions   Lupus (systemic lupus erythematosus) (HCC)    Migraine    a few/year (07/03/2016)   Pneumonia 12/2015   RA (rheumatoid arthritis) (HCC)    all over (07/03/2016)   Sickle cell trait    Sleep apnea    Stroke (HCC) 2014 X 1; 2015 X 2; 2016 X 1;    right side of face more relaxed than the other; rare speech hesitation (07/03/2016)   Vaginal Pap smear, abnormal    ASCUS; HPV    Assessment: Patient Reported Symptoms:  Cognitive Cognitive Status: Alert and oriented to person, place, and time, Normal speech and language skills Cognitive/Intellectual Conditions Management [RPT]: None reported or documented in medical history or problem list   Health Maintenance Behaviors: Annual physical exam, Healthy diet Health Facilitated by: Healthy diet, Pain control, Rest  Neurological  Neurological Review of Symptoms: Other: [place] Neurological Symptoms/Conditions [RPT]: right Sciatica pain Neurological Management Strategies: Adequate rest, Medication therapy, Routine screening Neurological Self-Management Outcome: 3 (uncertain) Neurological Comment: Patient is scheduled for an MRI of her lower spine on 11/26/24  HEENT HEENT Symptoms Reported: Other: (nasal congestion) HEENT Management Strategies: Adequate rest, Routine screening, Medication therapy HEENT Self-Management Outcome: 4 (good) HEENT Comment: s/p sinus surgery completed on 10/22/24    Cardiovascular Cardiovascular Symptoms Reported: No symptoms reported    Respiratory Respiratory Symptoms Reported: No symptoms reported    Endocrine Endocrine Symptoms Reported: No symptoms reported Is patient diabetic?: Yes Is patient checking blood sugars at home?: Yes List most recent blood sugar readings, include date and time of day: not provided today    Gastrointestinal Gastrointestinal Symptoms Reported: No symptoms reported      Genitourinary Genitourinary Symptoms Reported: No symptoms reported    Integumentary Integumentary Symptoms Reported: No symptoms reported    Musculoskeletal Musculoskelatal Symptoms Reviewed: Back pain, Difficulty walking, Joint pain, Limited mobility, Unsteady gait Additional Musculoskeletal Details: swelling to right knee; message sent to PCP requesting an Rx for a Rollator Musculoskeletal Management Strategies: Adequate rest, Routine screening Musculoskeletal Self-Management Outcome: 3 (uncertain) Musculoskeletal Comment: patient experienced severe lower back pain with Sciatica to the right side Falls in the past year?: No Number of falls in past year: 1 or less Was there an injury with Fall?: No Fall Risk Category Calculator: 0 Patient Fall Risk Level: Low Fall  Risk Patient at Risk for Falls Due to: History of fall(s), Impaired balance/gait, Impaired mobility Fall risk Follow up:  Education provided, Falls evaluation completed, Falls prevention discussed  Psychosocial Psychosocial Symptoms Reported: No symptoms reported   Major Change/Loss/Stressor/Fears (CP): Denies Quality of Family Relationships: helpful, involved, supportive Do you feel physically threatened by others?: No    11/02/2024    PHQ2-9 Depression Screening   Abeera Flannery interest or pleasure in doing things    Feeling down, depressed, or hopeless    PHQ-2 - Total Score    Trouble falling or staying asleep, or sleeping too much    Feeling tired or having Jossette Zirbel energy    Poor appetite or overeating     Feeling bad about yourself - or that you are a failure or have let yourself or your family down    Trouble concentrating on things, such as reading the newspaper or watching television    Moving or speaking so slowly that other people could have noticed.  Or the opposite - being so fidgety or restless that you have been moving around a lot more than usual    Thoughts that you would be better off dead, or hurting yourself in some way    PHQ2-9 Total Score    If you checked off any problems, how difficult have these problems made it for you to do your work, take care of things at home, or get along with other people    Depression Interventions/Treatment      There were no vitals filed for this visit. Pain Scale: 0-10 Pain Score: 8  Pain Type: Chronic pain, Neuropathic pain Pain Location: Buttocks Pain Orientation: Right, Lower Pain Descriptors / Indicators: Spasm, Constant, Radiating, Numbness, Sharp Pain Onset: On-going Pain Intervention(s): Repositioned, Medication (See eMAR), Rest Multiple Pain Sites: No  Medications Reviewed Today     Reviewed by Morgan Clayborne CROME, RN (Registered Nurse) on 11/02/24 at 1022  Med List Status: <None>   Medication Order Taking? Sig Documenting Provider Last Dose Status Informant  Accu-Chek Softclix Lancets lancets [id number]  Use as instructed Georgina Speaks, FNP   Active   albuterol  (VENTOLIN  HFA) 108 (90 Base) MCG/ACT inhaler [id number]  Inhale 2 puffs into the lungs every 6 (six) hours as needed for wheezing or shortness of breath. Kennyth Domino, FNP  Active   aspirin  81 MG chewable tablet [id number]  Chew 162 mg by mouth daily. Taking 2 tablets [provider]  Active Self  atorvastatin  (LIPITOR) 40 MG tablet [id number]  Take 1 tablet (40 mg total) by mouth daily. Georgina Speaks, FNP  Active   Blood Glucose Monitoring Suppl (ACCU-CHEK GUIDE ME) w/Device KIT [id number]  USE AS DIRECTED TO CHECK BLOOD  SUGAR Georgina Speaks, FNP  Active   budesonide  (PULMICORT ) 0.5 MG/2ML nebulizer solution [id number]  Take 0.5 mg by nebulization 2 (two) times daily. Patient is mixing with water and using as a nasal rinse per Dr. Alla ENT [provider]  Active   carvedilol  (COREG ) 12.5 MG tablet [id number]  Take 1 tablet (12.5 mg total) by mouth 2 (two) times daily with a meal. Colletta Manuelita Garre, PA-C  Active   Continuous Glucose Sensor (DEXCOM G7 SENSOR) MISC [id number]  USE TO CHECK BLOOD GLUCOSE AS  DIRECTED 3 TIMES DAILY AND  CHANGE SENSOR EVERY 10 DAYS Georgina Speaks, FNP  Active   diclofenac Sodium (VOLTAREN) 1 % GEL [id number]  apply A SMALL AMOUNT TO involved joints UP TO TWICE DAILY [provider]  Active  DULoxetine (CYMBALTA) 30 MG capsule [id number]  Take 60 mg by mouth 2 (two) times daily. [provider]  Active   Fezolinetant  (VEOZAH ) 45 MG TABS [id number]  TAKE 1 TABLET BY MOUTH DAILY Moore, Janece, FNP  Active   Fluticasone  Propionate (XHANCE ) 93 MCG/ACT EXHU [id number]  Place 1 spray into the nose in the morning and at bedtime. Georgina Speaks, FNP  Active   folic acid  (FOLVITE ) 1 MG tablet [id number]  TAKE 1 TABLET BY MOUTH EVERY MORNING Bensimhon, Toribio SAUNDERS, MD  Active Self  Glucagon  (GVOKE HYPOPEN  2-PACK) 1 MG/0.2ML SOAJ [id number]  Inject 1 mg into the skin as needed. Georgina Speaks, FNP  Active   glucose blood (ACCU-CHEK GUIDE  TEST) test strip [id number]  Use as instructed Georgina Speaks, FNP  Active   hydroxychloroquine  (PLAQUENIL ) 200 MG tablet [id number]  Take 1 tablet (200 mg total) by mouth 2 (two) times daily. Bensimhon, Daniel R, MD  Active   insulin  lispro (HUMALOG  KWIKPEN) 100 UNIT/ML KwikPen [id number]  INJECT SUBCUTANEOUSLY ACCORDING  TO SLIDING SCALE MAXIMUM DAILY  DOSE: 15 UNITS Georgina Speaks, FNP  Active   Insulin  Pen Needle (NOVOFINE PLUS PEN NEEDLE) 32G X 4 MM MISC [id number]  Use with insulin  pens dx code e11.65 Georgina Speaks, FNP  Active   Ipratropium-Albuterol  (COMBIVENT  RESPIMAT) 20-100 MCG/ACT AERS respimat [id number]  Inhale 1 puff into the lungs every 6 (six) hours. Georgina Speaks, FNP  Active   ipratropium-albuterol  (DUONEB) 0.5-2.5 (3) MG/3ML SOLN [id number]  USE 1 VIAL VIA NEBULIZER EVERY 6 HOURS AS NEEDED Georgina Speaks, FNP  Active   Magnesium  400 MG TABS [id number]  Take 1 tablet by mouth daily. Georgina Speaks, FNP  Active   methocarbamol (ROBAXIN) 500 MG tablet [id number]  Take 500 mg by mouth 4 (four) times daily. [provider]  Active   Methotrexate , PF, 20 MG/0.4ML SOAJ [id number]  Inject 20 mg into the skin once a week. Patient taking on Friday's [provider]  Active   metolazone  (ZAROXOLYN ) 2.5 MG tablet [id number]  TAKE 1 TABLET BY MOUTH AS NEEDED FOR WEIGHT GAIN OF 5 POUNDS WITHIN 3 DAYS AS DIRECTED needs appt for further refills Bensimhon, Daniel R, MD  Active   Multiple Vitamin (MULTIVITAMIN WITH MINERALS) TABS [id number]  Take 1 tablet by mouth every morning.  [provider]  Active Self  ondansetron  (ZOFRAN -ODT) 4 MG disintegrating tablet [id number]  4mg  ODT q4 hours prn nausea/vomit Zammit, Joseph, MD  Active   Phenylephrine -APAP-guaiFENesin  5-325-200 MG TABS [id number]  Take by mouth 2 (two) times daily. Per Dr. Alla ENT [provider]  Active   potassium chloride  (KLOR-CON  M) 10 MEQ tablet [id number]  TAKE 3 TABLETS BY MOUTH TWICE  DAILY Moore, Janece, FNP   Active   predniSONE  (DELTASONE ) 5 MG tablet [id number]  Take 5 mg by mouth daily. [provider]  Active   PROAIR  HFA 108 (90 Base) MCG/ACT inhaler [id number]  INHALE 2 PUFFS BY MOUTH EVERY DAY AS NEEDED FOR SHORTNESS OF SHERIDA Georgina Speaks, FNP  Active   sacubitril -valsartan  (ENTRESTO ) 24-26 MG [id number]  Take 1 tablet by mouth 2 (two) times daily. Please cancel all previous orders for current medication. Change in dosage or pill size. Bensimhon, Toribio SAUNDERS, MD  Active   spironolactone  (ALDACTONE ) 25 MG tablet [id number]  TAKE 1 TABLET BY MOUTH IN THE  MORNING AND 1 TABLET BY MOUTH IN THE EVENING Bensimhon, Toribio SAUNDERS, MD  Active   Tocilizumab  (ACTEMRA  ACTPEN) 162 MG/0.9ML SOAJ [id number]  Inject 162  mg into the skin once a week. Patient is taking weekly on Fridays for Lupus. [provider]  Active   TRESIBA  FLEXTOUCH 100 UNIT/ML FlexTouch Pen [id number]  INJECT SUBCUTANEOUSLY INTO SKIN  10 UNITS DAILY Moore, Janece, FNP  Active   Vitamin D , Ergocalciferol , (DRISDOL ) 1.25 MG (50000 UNIT) CAPS capsule [id number] No Take 1 capsule (50,000 Units total) by mouth 2 (two) times a week. Georgina Speaks, FNP Taking Active             Recommendation:   Specialty provider follow-up with Dr. Alla, ENT as directed Specialty provider follow-up   11/04/2024 8:15 AM EST Appointment [facility] - RADIOLOGY XR P1  [address] [place], [place] [address]  [phone]       11/04/2024 8:20 AM EST Office Visit [facility] - MS[facility] Orthopedic Joint  [address]  [place], [place] [address]  [phone]  Otelia Angelita Pride, PA-C      Follow Up Plan:   Telephone follow up appointment date/time:  Monday, December 8 at 09:30 AM  Clayborne Ly RN BSN CCM [place]  [facility], [facility] Health Nurse Care Coordinator  Direct Dial : [phone] Website: [email or web address]

## 2024-11-10 ENCOUNTER — Encounter: Payer: Self-pay | Admitting: Nurse Practitioner

## 2024-11-10 ENCOUNTER — Other Ambulatory Visit: Payer: Self-pay | Admitting: Nurse Practitioner

## 2024-11-10 NOTE — Telephone Encounter (Signed)
 Provider called and explained to patient why she needs appointment.

## 2024-11-11 ENCOUNTER — Other Ambulatory Visit: Payer: Self-pay | Admitting: Nurse Practitioner

## 2024-11-24 ENCOUNTER — Ambulatory Visit

## 2024-11-29 ENCOUNTER — Telehealth

## 2024-11-30 ENCOUNTER — Other Ambulatory Visit

## 2024-11-30 NOTE — Patient Instructions (Signed)
 Visit Information  Thank you for taking time to visit with me today. Please don't hesitate to contact me if I can be of assistance to you before our next scheduled appointment.  Your next care management appointment is by telephone on Monday, January 5 at 1:30 PM  Please call the care guide team at [phone] if you need to cancel, schedule, or reschedule an appointment.   Please call [phone]-TALK (toll free, 24 hour hotline) if you are experiencing a Mental Health or Behavioral Health Crisis or need someone to talk to.  Clayborne Ly RN BSN CCM [place]  [facility], [facility] Health Nurse Care Coordinator  Direct Dial : [phone] Website: [email or web address]

## 2024-11-30 NOTE — Patient Outreach (Signed)
 Complex Care Management   Visit Note  11/30/2024  Name:  Christine Mcdowell MRN: [id number] DOB: Nov 26, 1971  Situation: Referral received for Complex Care Management related to Heart Failure, Diabetes with Complications, and Hypertensive Heart Disease with chronic Systolic Heart Failure, Chronic pain secondary to RA/Lupus, Chronic Sinusitis. I obtained verbal consent from Patient.  Visit completed with Patient on the phone.  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   Discoid lupus    Fibromyalgia    History of blood transfusion several   related to anemia; had some w/hysterectomy also   Hx of cardiovascular stress test    ETT-Myoview (9/15):  No ischemia, EF 52%; NORMAL   Hx of echocardiogram    Echo (9/15):  EF 50-55%, ant HK, Gr 1 DD, mild MR, mild LAE, no effusion   Hypertension    Iron deficiency anemia    h/o iron transfusions   Lupus (systemic lupus erythematosus) (HCC)    Migraine    a few/year (07/03/2016)   Pneumonia 12/2015   RA (rheumatoid arthritis) (HCC)    all over (07/03/2016)   Sickle cell trait    Sleep apnea    Stroke (HCC) 2014 X 1; 2015 X 2; 2016 X 1;    right side of face more relaxed than the other; rare speech hesitation (07/03/2016)   Vaginal Pap smear, abnormal    ASCUS; HPV    Assessment: Patient Reported Symptoms:  Cognitive Cognitive Status: Alert and oriented to person, place, and time, Normal speech and language skills Cognitive/Intellectual Conditions Management [RPT]: None reported or documented in medical history or problem list   Health Maintenance Behaviors: Annual physical exam, Healthy diet Health Facilitated by: Healthy diet, Rest  Neurological Neurological Review  of Symptoms: No symptoms reported    HEENT HEENT Symptoms Reported: Nasal discharge HEENT Management Strategies: Adequate rest, Medication therapy, Routine screening HEENT Self-Management Outcome: 3 (uncertain)    Cardiovascular Cardiovascular Symptoms Reported: No symptoms reported    Respiratory Respiratory Symptoms Reported: No symptoms reported    Endocrine Endocrine Symptoms Reported: Not assessed    Gastrointestinal Gastrointestinal Symptoms Reported: Not assessed      Genitourinary Genitourinary Symptoms Reported: Not assessed    Integumentary Integumentary Symptoms Reported: Not assessed    Musculoskeletal Musculoskelatal Symptoms Reviewed: Back pain, Joint pain Musculoskeletal Management Strategies: Medication therapy, Routine screening Musculoskeletal Self-Management Outcome: 4 (good)      Psychosocial Psychosocial Symptoms Reported: No symptoms reported   Major Change/Loss/Stressor/Fears (CP): Denies Quality of Family Relationships: involved, helpful, supportive Do you feel physically threatened by others?: No    11/30/2024    PHQ2-9 Depression Screening   Bren Borys interest or pleasure in doing things    Feeling down, depressed, or hopeless    PHQ-2 - Total Score    Trouble falling or staying asleep, or sleeping too much    Feeling tired or having Krishang Reading energy    Poor appetite or overeating     Feeling bad about yourself - or that you are a failure or have let yourself or your family down    Trouble concentrating on things, such as reading the newspaper or watching television    Moving or speaking so slowly that other  people could have noticed.  Or the opposite - being so fidgety or restless that you have been moving around a lot more than usual    Thoughts that you would be better off dead, or hurting yourself in some way    PHQ2-9 Total Score    If you checked off any problems, how difficult have these problems made it for you to do your work, take care of  things at home, or get along with other people    Depression Interventions/Treatment      There were no vitals filed for this visit. Pain Scale: Not given for pain  Medications Reviewed Today     Reviewed by Morgan Clayborne CROME, RN (Registered Nurse) on 11/30/24 at 0932  Med List Status: <None>   Medication Order Taking? Sig Documenting Provider Last Dose Status Informant  Accu-Chek Softclix Lancets lancets [id number]  Use as instructed Georgina Speaks, FNP  Active   albuterol  (VENTOLIN  HFA) 108 (90 Base) MCG/ACT inhaler [id number]  Inhale 2 puffs into the lungs every 6 (six) hours as needed for wheezing or shortness of breath. Kennyth Domino, FNP  Active   amoxicillin -clavulanate (AUGMENTIN ) 875-125 MG tablet [id number] Yes Take 1 tablet by mouth 2 (two) times daily. [provider]  Active   aspirin  81 MG chewable tablet [id number]  Chew 162 mg by mouth daily. Taking 2 tablets [provider]  Active Self  atorvastatin  (LIPITOR) 40 MG tablet [id number]  Take 1 tablet (40 mg total) by mouth daily. Georgina Speaks, FNP  Active   Blood Glucose Monitoring Suppl (ACCU-CHEK GUIDE ME) w/Device PRESSLEY [id number]  USE AS DIRECTED TO CHECK BLOOD  SUGAR Georgina Speaks, FNP  Active   budesonide  (PULMICORT ) 0.5 MG/2ML nebulizer solution [id number]  Take 0.5 mg by nebulization 2 (two) times daily. Patient is mixing with water and using as a nasal rinse per Dr. Alla ENT [provider]  Active   carvedilol  (COREG ) 12.5 MG tablet [id number]  Take 1 tablet (12.5 mg total) by mouth 2 (two) times daily with a meal. Colletta Manuelita Garre, PA-C  Active   Continuous Glucose Sensor (DEXCOM G7 SENSOR) MISC [id number]  USE TO CHECK BLOOD GLUCOSE AS  DIRECTED 3 TIMES DAILY AND  CHANGE SENSOR EVERY 10 DAYS Georgina Speaks, FNP  Active   diclofenac Sodium (VOLTAREN) 1 % GEL [id number]  apply A SMALL AMOUNT TO involved joints UP TO TWICE DAILY [provider]  Active   DULoxetine (CYMBALTA) 30 MG capsule  [id number]  Take 60 mg by mouth 2 (two) times daily. [provider]  Active   Fezolinetant  (VEOZAH ) 45 MG TABS [id number]  TAKE 1 TABLET BY MOUTH DAILY Georgina Speaks, FNP  Active   Fluticasone  Propionate (XHANCE ) 93 MCG/ACT EXHU [id number]  Place 1 spray into the nose in the morning and at bedtime. Georgina Speaks, FNP  Active   folic acid  (FOLVITE ) 1 MG tablet [id number]  TAKE 1 TABLET BY MOUTH EVERY MORNING Bensimhon, Toribio SAUNDERS, MD  Active Self  Glucagon  (GVOKE HYPOPEN  2-PACK) 1 MG/0.2ML SOAJ [id number]  Inject 1 mg into the skin as needed. Georgina Speaks, FNP  Active   glucose blood (ACCU-CHEK GUIDE TEST) test strip [id number]  Use as instructed Georgina Speaks, FNP  Active   hydroxychloroquine  (PLAQUENIL ) 200 MG tablet [id number]  Take 1 tablet (200 mg total) by mouth 2 (two) times daily. Bensimhon, Toribio SAUNDERS, MD  Active   insulin  lispro (HUMALOG  KWIKPEN) 100 UNIT/ML KwikPen [id number]  INJECT SUBCUTANEOUSLY ACCORDING  TO SLIDING SCALE  MAXIMUM DAILY  DOSE: 15 UNITS Georgina Speaks, FNP  Active   Insulin  Pen Needle (NOVOFINE PLUS PEN NEEDLE) 32G X 4 MM MISC [id number]  Use with insulin  pens dx code e11.65 Georgina Speaks, FNP  Active   Ipratropium-Albuterol  (COMBIVENT  RESPIMAT) 20-100 MCG/ACT AERS respimat [id number]  Inhale 1 puff into the lungs every 6 (six) hours. Georgina Speaks, FNP  Active   ipratropium-albuterol  (DUONEB) 0.5-2.5 (3) MG/3ML SOLN [id number]  USE 1 VIAL VIA NEBULIZER EVERY 6 HOURS AS NEEDED Georgina Speaks, FNP  Active   Magnesium  400 MG TABS [id number]  Take 1 tablet by mouth daily. Georgina Speaks, FNP  Active   methocarbamol (ROBAXIN) 500 MG tablet [id number]  Take 500 mg by mouth 4 (four) times daily. [provider]  Active   Methotrexate , PF, 20 MG/0.4ML SOAJ [id number]  Inject 20 mg into the skin once a week. Patient taking on Friday's [provider]  Active   metolazone  (ZAROXOLYN ) 2.5 MG tablet [id number]  TAKE 1 TABLET BY MOUTH AS NEEDED FOR WEIGHT GAIN OF 5 POUNDS WITHIN  3 DAYS AS DIRECTED needs appt for further refills Bensimhon, Daniel R, MD  Active   Multiple Vitamin (MULTIVITAMIN WITH MINERALS) TABS [id number]  Take 1 tablet by mouth every morning.  [provider]  Active Self  ondansetron  (ZOFRAN -ODT) 4 MG disintegrating tablet [id number]  4mg  ODT q4 hours prn nausea/vomit Zammit, Joseph, MD  Active   Phenylephrine -APAP-guaiFENesin  5-325-200 MG TABS [id number]  Take by mouth 2 (two) times daily. Per Dr. Alla ENT [provider]  Active   potassium chloride  (KLOR-CON  M) 10 MEQ tablet [id number]  TAKE 3 TABLETS BY MOUTH TWICE  DAILY Moore, Janece, FNP  Active   predniSONE  (DELTASONE ) 5 MG tablet [id number]  Take 5 mg by mouth daily. [provider]  Active   PROAIR  HFA 108 (90 Base) MCG/ACT inhaler [id number]  INHALE 2 PUFFS BY MOUTH EVERY DAY AS NEEDED FOR SHORTNESS OF SHERIDA Georgina Speaks, FNP  Active   sacubitril -valsartan  (ENTRESTO ) 24-26 MG [id number]  Take 1 tablet by mouth 2 (two) times daily. Please cancel all previous orders for current medication. Change in dosage or pill size. Bensimhon, Toribio SAUNDERS, MD  Active   spironolactone  (ALDACTONE ) 25 MG tablet [id number]  TAKE 1 TABLET BY MOUTH IN THE  MORNING AND 1 TABLET BY MOUTH IN THE EVENING Bensimhon, Toribio SAUNDERS, MD  Active   Tocilizumab  (ACTEMRA  ACTPEN) 162 MG/0.9ML SOAJ [id number]  Inject 162 mg into the skin once a week. Patient is taking weekly on Fridays for Lupus. [provider]  Active   TRESIBA  FLEXTOUCH 100 UNIT/ML FlexTouch Pen [id number]  INJECT SUBCUTANEOUSLY INTO SKIN  10 UNITS DAILY Moore, Janece, FNP  Active   Vitamin D , Ergocalciferol , (DRISDOL ) 1.25 MG (50000 UNIT) CAPS capsule [id number]  Take 1 capsule (50,000 Units total) by mouth 2 (two) times a week. Georgina Speaks, FNP  Active             Recommendation:   PCP Follow-up  12/06/2024 Status: Sch   Time: 12:00 PM Length: 20  Visit Type: OFFICE VISIT [8002] Copay: $0.00  Provider: Georgina Speaks, FNP  Department: [facility]   Specialty provider follow-up  Follow-up with Dr. Alla as directed   Follow Up Plan:   Telephone follow up appointment date/time  12/27/2024 Status: Sch   Time: 1:30 PM Length: 30  Visit Type: VBCI TELEPHONE CALL 30 [2502] Copay: $0.00  Provider: Morgan Clayborne CROME, RN Department: [facility]   Earley Grobe RN  BSN CCM [facility]  [facility], [facility] Health Nurse Care Coordinator  Direct Dial : [phone] Website: [email or web address]

## 2024-12-01 ENCOUNTER — Ambulatory Visit

## 2024-12-02 ENCOUNTER — Ambulatory Visit: Payer: Self-pay | Admitting: Internal Medicine

## 2024-12-02 LAB — CUP PACEART REMOTE DEVICE CHECK
Battery Remaining Longevity: 102 mo
Battery Remaining Percentage: 71 %
Brady Statistic RV Percent Paced: 0 %
Date Time Interrogation Session: 20251210095800
HighPow Impedance: 79 Ohm
Implantable Lead Connection Status: 753985
Implantable Lead Implant Date: 20190206
Implantable Lead Location: 753860
Implantable Lead Model: 292
Implantable Lead Serial Number: 438194
Implantable Pulse Generator Implant Date: 20190206
Lead Channel Impedance Value: 556 Ohm
Lead Channel Setting Pacing Amplitude: 2.5 V
Lead Channel Setting Pacing Pulse Width: 0.4 ms
Lead Channel Setting Sensing Sensitivity: 0.5 mV
Pulse Gen Serial Number: 243572
Zone Setting Status: 755011

## 2024-12-06 ENCOUNTER — Encounter: Payer: Self-pay | Admitting: Nurse Practitioner

## 2024-12-06 ENCOUNTER — Ambulatory Visit: Admitting: Nurse Practitioner

## 2024-12-06 VITALS — BP 120/70 | HR 76 | Temp 98.9°F | Ht 66.0 in | Wt 193.0 lb

## 2024-12-06 DIAGNOSIS — I110 Hypertensive heart disease with heart failure: Secondary | ICD-10-CM

## 2024-12-06 DIAGNOSIS — Z139 Encounter for screening, unspecified: Secondary | ICD-10-CM

## 2024-12-06 DIAGNOSIS — E559 Vitamin D deficiency, unspecified: Secondary | ICD-10-CM | POA: Diagnosis not present

## 2024-12-06 DIAGNOSIS — Z780 Asymptomatic menopausal state: Secondary | ICD-10-CM

## 2024-12-06 DIAGNOSIS — E6609 Other obesity due to excess calories: Secondary | ICD-10-CM

## 2024-12-06 DIAGNOSIS — Z6831 Body mass index (BMI) 31.0-31.9, adult: Secondary | ICD-10-CM

## 2024-12-06 DIAGNOSIS — E785 Hyperlipidemia, unspecified: Secondary | ICD-10-CM | POA: Diagnosis not present

## 2024-12-06 DIAGNOSIS — M329 Systemic lupus erythematosus, unspecified: Secondary | ICD-10-CM

## 2024-12-06 DIAGNOSIS — E66811 Obesity, class 1: Secondary | ICD-10-CM | POA: Diagnosis not present

## 2024-12-06 DIAGNOSIS — Z794 Long term (current) use of insulin: Secondary | ICD-10-CM

## 2024-12-06 DIAGNOSIS — E1169 Type 2 diabetes mellitus with other specified complication: Secondary | ICD-10-CM | POA: Diagnosis not present

## 2024-12-06 NOTE — Progress Notes (Unsigned)
 Christine Mcdowell, CMA,acting as a neurosurgeon for Christine Ada, FNP.,have documented all relevant documentation on the behalf of Christine Ada, FNP,as directed by  Christine Ada, FNP while in the presence of Christine Ada, FNP.  Subjective:  Patient ID: Christine Mcdowell , female    DOB: Jun 07, 1971 , 53 y.o.   MRN: [id number]  Chief Complaint  Patient presents with   Hypertension    Patient presents today for a bp and dm follow up, Patient reports compliance with medication. Patient denies any chest pain, SOB, or headaches. Patient has no concerns today.     She is doing well with her heart. She no longer has CHF, she has given the choice to take the ICD out. She has been having sinus issues and had sinus surgery, she had her last one this October. He wants to put her on dupixent to help prevent frequent antibiotics. Awaiting Dr. Gala for clearance. Currently on antibiotics. Had an MRI of her back on Friday - Dr. Chesley - she is awaiting her results. Has pain with cold air, mornings and evenings after school worse. After walking all day has more pain. She has been using the Humalog  more often due to being on prednisone .   Diabetes She presents for her follow-up diabetic visit. She has type 2 diabetes mellitus. Her disease course has been stable. Pertinent negatives for hypoglycemia include no confusion or nervousness/anxiousness. There are no diabetic associated symptoms. Pertinent negatives for diabetes include no polydipsia, no polyphagia and no polyuria. There are no hypoglycemic complications. (She has drank juice to help increase her blood sugars. ) Symptoms are stable. Diabetic complications include peripheral neuropathy. Pertinent negatives for diabetic complications include no heart disease. Risk factors for coronary artery disease include sedentary lifestyle and obesity. Current diabetic treatment includes oral agent (dual therapy) (tresiba  (10 units nightly) and metformin  - not having spikes - she is  not needing the humalog  at this time). She is compliant with treatment all of the time. Her weight is stable. She is following a generally healthy diet. When asked about meal planning, she reported none. She has not had a previous visit with a dietitian. She rarely participates in exercise. (She will send a copy of the blood sugars. ) An ACE inhibitor/angiotensin II receptor blocker is being taken. She does not see a podiatrist.Eye exam is not current.  Hypertension This is a chronic problem. The current episode started more than 1 year ago. The problem is controlled. Pertinent negatives include no anxiety. Risk factors for coronary artery disease include obesity and sedentary lifestyle. Past treatments include diuretics and calcium  channel blockers. There are no compliance problems.  There is no history of angina.    Discussed the use of AI scribe software for clinical note transcription with the patient, who gave verbal consent to proceed.  History of Present Illness Christine Mcdowell is a 53 year old female who presents for a follow-up visit.  Her heart condition has improved significantly, and she no longer has a diagnosis of congestive heart failure. She continues with random monitoring and prefers to keep her ICD until the battery dies due to her history of multiple surgeries.  She has persistent sinus issues and has undergone multiple sinus surgeries since October last year. An emergency room visit was necessary due to excessive bleeding post-surgery, requiring cauterization. Despite these interventions, infections recur within two weeks of stopping antibiotics. She is currently on antibiotics and awaiting approval to start Dupixent shots.  Chronic back pain is exacerbated by  cold weather, particularly in the mornings and evenings after school. An MRI was performed recently, but results are pending. She has been diagnosed with arthritis in her back, which worsens with physical activity, such as  walking on campus. A recent urgent care visit for severe back pain resulted in a diagnosis of a pinched nerve.  Diabetes management is challenging due to high doses of prednisone , necessitating the use of fast-acting insulin  (Humalog ). She did not bring her glucose log but plans to send it via MyChart. She acknowledges that her A1c might be elevated due to prednisone  use.  She experiences menopausal symptoms, specifically hot flashes, despite taking Veozah . She describes severe sweating episodes, particularly during the day, impacting daily activities like attending church and classes. The hot flashes were more frequent and severe during a two-week period without medication.  She no longer uses a CPAP machine, as it was discontinued years ago. She notes a recent issue with low protein levels in her liver function tests.  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   Allergy  1971/04/18   From birth   Anemia    Anginal pain    Asthma    Cataract    Surgical removal   Cervical cancer (HCC)    cervical 1996   CHF (congestive heart failure) (HCC)    Clotting disorder    Hypfibringenemia   Depression 2015   No longer depressed   Diabetes mellitus without complication (HCC)    steroid induced   Discoid lupus    Fibromyalgia    History of blood transfusion several   related to anemia; had some w/hysterectomy also   Hx of cardiovascular stress test    ETT-Myoview (9/15):  No ischemia, EF 52%; NORMAL   Hx of echocardiogram    Echo (9/15):  EF 50-55%, ant HK, Gr 1 DD, mild MR, mild LAE, no effusion   Hypertension    Iron deficiency anemia    h/o iron transfusions   Lupus (systemic lupus erythematosus) (HCC)    Migraine    a few/year (07/03/2016)   Pneumonia 12/2015   RA (rheumatoid arthritis) (HCC)    all over (07/03/2016)   Sickle cell trait    Sleep apnea    Stroke (HCC) 2014 X 1; 2015 X 2; 2016 X 1;    right side  of face more relaxed than the other; rare speech hesitation (07/03/2016)   Vaginal Pap smear, abnormal    ASCUS; HPV     Family History  Problem Relation Age of Onset   Arthritis Mother    Heart murmur Mother    Drug abuse Mother    Allergies Mother    Depression Mother    Miscarriages / Stillbirths Mother    Heart attack Father    Cushing syndrome Father    Depression Father    Allergies Father    Diabetes Maternal Grandmother    Hypertension Maternal Grandmother    Asthma Maternal Grandmother    Heart attack Maternal Grandfather    Dementia Paternal Grandmother    Cancer Paternal Grandfather    Breast cancer Neg Hx    Colon cancer Neg Hx    Rectal cancer Neg Hx    Stomach cancer Neg Hx     Current Medications[1]   Allergies[2]   Review of Systems  Constitutional: Negative.   Respiratory: Negative.    Cardiovascular: Negative.   Endocrine: Negative for polydipsia, polyphagia and polyuria.  Neurological: Negative.   Psychiatric/Behavioral:  Negative.  Negative for confusion. The patient is not nervous/anxious.      Today's Vitals   12/06/24 1149  BP: 120/70  Pulse: 76  Temp: 98.9 F (37.2 C)  TempSrc: Oral  Weight: 193 lb (87.5 kg)  Height: 5' 6 (1.676 m)  PainSc: 7   PainLoc: Back   Body mass index is 31.15 kg/m.  Wt Readings from Last 3 Encounters:  12/06/24 193 lb (87.5 kg)  07/12/24 192 lb 3.2 oz (87.2 kg)  06/02/24 195 lb 6.4 oz (88.6 kg)    The ASCVD Risk score (Arnett DK, et al., 2019) failed to calculate for the following reasons:   Risk score cannot be calculated because patient has a medical history suggesting prior/existing ASCVD   * - Cholesterol units were assumed  Objective:  Physical Exam Vitals and nursing note reviewed.  Constitutional:      General: She is not in acute distress.    Appearance: Normal appearance. She is obese.  Cardiovascular:     Rate and Rhythm: Normal rate and regular rhythm.     Pulses: Normal pulses.      Heart sounds: Normal heart sounds. No murmur heard. Pulmonary:     Effort: Pulmonary effort is normal. No respiratory distress.  Skin:    Capillary Refill: Capillary refill takes less than 2 seconds.  Neurological:     General: No focal deficit present.     Mental Status: She is alert and oriented to person, place, and time.     Cranial Nerves: No cranial nerve deficit.     Motor: No weakness.  Psychiatric:        Mood and Affect: Mood normal.        Behavior: Behavior normal.        Thought Content: Thought content normal.        Judgment: Judgment normal.         Assessment And Plan:   Assessment & Plan Benign hypertension with coincident congestive heart failure (HCC)  Type 2 diabetes mellitus with hyperlipidemia (HCC)  Vitamin D  deficiency  Class 1 obesity due to excess calories with body mass index (BMI) of 31.0 to 31.9 in adult, unspecified whether serious comorbidity present  Encounter for screening  Menopause  Systemic lupus erythematosus, unspecified SLE type, unspecified organ involvement status (HCC)   Orders Placed This Encounter  Procedures   BMP8+eGFR   Hemoglobin A1c   VITAMIN D  25 Hydroxy (Vit-D Deficiency, Fractures)   Hepatitis B Surface Antibody   Ambulatory referral to Gynecology   Assessment & Plan Chronic rhinosinusitis with recurrent infections Persistent sinus infections despite antibiotics. Awaiting Dupixent approval. - Await approval for Dupixent treatment from Dr. Gala.  Type 2 diabetes mellitus with hyperlipidemia Increased insulin  needs due to prednisone . A1c likely elevated. - Checked A1c level.  Menopausal symptoms Severe hot flashes despite Veozah . No GYN follow-up post-hysterectomy. - Referred to Madolyn Monte, GYN, for further management of menopausal symptoms.  Lumbar spine osteoarthritis with radiculopathy Chronic back pain with recent MRI. Pain worsens with cold and activity. - Await MRI results from [facility]  Radiology.  Vitamin D  deficiency Not taking supplements. - Checked vitamin D  levels.  General health maintenance Routine health maintenance discussed.   Return for KEEP SAME NEXT.  Patient was given opportunity to ask questions. Patient verbalized understanding of the plan and was able to repeat key elements of the plan. All questions were answered to their satisfaction.    Christine Christine Ada, FNP, have reviewed all documentation for  this visit. The documentation on 12/06/2024 for the exam, diagnosis, procedures, and orders are all accurate and complete.   IF YOU HAVE BEEN REFERRED TO A SPECIALIST, IT MAY TAKE 1-2 WEEKS TO SCHEDULE/PROCESS THE REFERRAL. IF YOU HAVE NOT HEARD FROM US /SPECIALIST IN TWO WEEKS, PLEASE GIVE US  A CALL AT [phone].      [1]  Current Outpatient Medications:    Accu-Chek Softclix Lancets lancets, Use as instructed, Disp: 100 each, Rfl: 12   albuterol  (VENTOLIN  HFA) 108 (90 Base) MCG/ACT inhaler, Inhale 2 puffs into the lungs every 6 (six) hours as needed for wheezing or shortness of breath., Disp: 8 g, Rfl: 0   amoxicillin -clavulanate (AUGMENTIN ) 875-125 MG tablet, Take 1 tablet by mouth 2 (two) times daily., Disp: , Rfl:    aspirin  81 MG chewable tablet, Chew 162 mg by mouth daily. Taking 2 tablets, Disp: , Rfl:    atorvastatin  (LIPITOR) 40 MG tablet, Take 1 tablet (40 mg total) by mouth daily., Disp: 90 tablet, Rfl: 3   Blood Glucose Monitoring Suppl (ACCU-CHEK GUIDE ME) w/Device KIT, USE AS DIRECTED TO CHECK BLOOD  SUGAR, Disp: 1 kit, Rfl: 1   budesonide  (PULMICORT ) 0.5 MG/2ML nebulizer solution, Take 0.5 mg by nebulization 2 (two) times daily. Patient is mixing with water and using as a nasal rinse per Dr. Alla ENT, Disp: , Rfl:    carvedilol  (COREG ) 12.5 MG tablet, Take 1 tablet (12.5 mg total) by mouth 2 (two) times daily with a meal., Disp: 200 tablet, Rfl: 3   Continuous Glucose Sensor (DEXCOM G7 SENSOR) MISC, USE TO CHECK BLOOD GLUCOSE AS   DIRECTED 3 TIMES DAILY AND  CHANGE SENSOR EVERY 10 DAYS, Disp: 10 each, Rfl: 2   diclofenac Sodium (VOLTAREN) 1 % GEL, apply A SMALL AMOUNT TO involved joints UP TO TWICE DAILY, Disp: , Rfl:    DULoxetine (CYMBALTA) 30 MG capsule, Take 60 mg by mouth 2 (two) times daily., Disp: , Rfl:    Fezolinetant  (VEOZAH ) 45 MG TABS, TAKE 1 TABLET BY MOUTH DAILY, Disp: 90 tablet, Rfl: 1   Fluticasone  Propionate (XHANCE ) 93 MCG/ACT EXHU, Place 1 spray into the nose in the morning and at bedtime., Disp: 16 mL, Rfl: 2   folic acid  (FOLVITE ) 1 MG tablet, TAKE 1 TABLET BY MOUTH EVERY MORNING, Disp: 30 tablet, Rfl: 0   Glucagon  (GVOKE HYPOPEN  2-PACK) 1 MG/0.2ML SOAJ, Inject 1 mg into the skin as needed., Disp: 0.2 mL, Rfl: 5   glucose blood (ACCU-CHEK GUIDE TEST) test strip, Use as instructed, Disp: 100 each, Rfl: 12   hydroxychloroquine  (PLAQUENIL ) 200 MG tablet, Take 1 tablet (200 mg total) by mouth 2 (two) times daily., Disp: 180 tablet, Rfl: 0   insulin  lispro (HUMALOG  KWIKPEN) 100 UNIT/ML KwikPen, INJECT SUBCUTANEOUSLY ACCORDING  TO SLIDING SCALE MAXIMUM DAILY  DOSE: 15 UNITS, Disp: 15 mL, Rfl: 2   Insulin  Pen Needle (NOVOFINE PLUS PEN NEEDLE) 32G X 4 MM MISC, Use with insulin  pens dx code e11.65, Disp: 300 each, Rfl: 3   Ipratropium-Albuterol  (COMBIVENT  RESPIMAT) 20-100 MCG/ACT AERS respimat, Inhale 1 puff into the lungs every 6 (six) hours., Disp: 4 g, Rfl: 5   ipratropium-albuterol  (DUONEB) 0.5-2.5 (3) MG/3ML SOLN, USE 1 VIAL VIA NEBULIZER EVERY 6 HOURS AS NEEDED, Disp: 3 mL, Rfl: 1   Magnesium  400 MG TABS, Take 1 tablet by mouth daily., Disp: 90 tablet, Rfl: 1   methocarbamol (ROBAXIN) 500 MG tablet, Take 500 mg by mouth 4 (four) times daily., Disp: , Rfl:  Methotrexate , PF, 20 MG/0.4ML SOAJ, Inject 20 mg into the skin once a week. Patient taking on Friday's, Disp: , Rfl:    metolazone  (ZAROXOLYN ) 2.5 MG tablet, TAKE 1 TABLET BY MOUTH AS NEEDED FOR WEIGHT GAIN OF 5 POUNDS WITHIN 3 DAYS AS DIRECTED needs appt  for further refills, Disp: 5 tablet, Rfl: 0   Multiple Vitamin (MULTIVITAMIN WITH MINERALS) TABS, Take 1 tablet by mouth every morning. , Disp: , Rfl:    ondansetron  (ZOFRAN -ODT) 4 MG disintegrating tablet, 4mg  ODT q4 hours prn nausea/vomit, Disp: 10 tablet, Rfl: 0   Phenylephrine -APAP-guaiFENesin  5-325-200 MG TABS, Take by mouth 2 (two) times daily. Per Dr. Alla ENT, Disp: , Rfl:    potassium chloride  (KLOR-CON  M) 10 MEQ tablet, TAKE 3 TABLETS BY MOUTH TWICE  DAILY, Disp: 600 tablet, Rfl: 2   predniSONE  (DELTASONE ) 5 MG tablet, Take 5 mg by mouth daily., Disp: , Rfl:    PROAIR  HFA 108 (90 Base) MCG/ACT inhaler, INHALE 2 PUFFS BY MOUTH EVERY DAY AS NEEDED FOR SHORTNESS OF BREATH, Disp: 18 g, Rfl: 2   sacubitril -valsartan  (ENTRESTO ) 24-26 MG, Take 1 tablet by mouth 2 (two) times daily. Please cancel all previous orders for current medication. Change in dosage or pill size., Disp: 60 tablet, Rfl: 11   spironolactone  (ALDACTONE ) 25 MG tablet, TAKE 1 TABLET BY MOUTH IN THE  MORNING AND 1 TABLET BY MOUTH IN THE EVENING, Disp: 200 tablet, Rfl: 2   Tocilizumab  (ACTEMRA  ACTPEN) 162 MG/0.9ML SOAJ, Inject 162 mg into the skin once a week. Patient is taking weekly on Fridays for Lupus., Disp: , Rfl:    traMADol  (ULTRAM ) 50 MG tablet, Take 50 mg by mouth every 6 (six) hours as needed for moderate pain (pain score 4-6)., Disp: , Rfl:    TRESIBA  FLEXTOUCH 100 UNIT/ML FlexTouch Pen, INJECT SUBCUTANEOUSLY INTO SKIN  10 UNITS DAILY, Disp: 15 mL, Rfl: 2   Vitamin D , Ergocalciferol , (DRISDOL ) 1.25 MG (50000 UNIT) CAPS capsule, Take 1 capsule (50,000 Units total) by mouth 2 (two) times a week., Disp: 12 capsule, Rfl: 3 [2]  Allergies Allergen Reactions   Hydrocodone -Acetaminophen  Nausea And Vomiting   Hydromorphone Nausea And Vomiting    Other reaction(s): GI Upset (intolerance), Hypertension (intolerance) Raises blood pressure  Other reaction(s): GI Upset (intolerance), Hypertension (intolerance) Raises blood  pressure to stroke level   Iodinated Contrast Media Other (See Comments)    Shuts down kidneys Shuts kidney function down   Other Other (See Comments) and Anaphylaxis    Spicy foods and seasonings Skin Prep makes my skin peel off Paper tape causes skin burns   Erythromycin Nausea And Vomiting   Latex Hives   Rinvoq [Upadacitinib] Other (See Comments)   Farxiga  [Dapagliflozin ] Other (See Comments)   Tape Other (See Comments)    Skin burns   Mircette [Desogestrel-Ethinyl Estradiol] Nausea And Vomiting and Rash

## 2024-12-07 ENCOUNTER — Ambulatory Visit: Payer: Self-pay | Admitting: Nurse Practitioner

## 2024-12-07 ENCOUNTER — Encounter (HOSPITAL_BASED_OUTPATIENT_CLINIC_OR_DEPARTMENT_OTHER): Payer: Self-pay | Admitting: Nurse Practitioner

## 2024-12-07 ENCOUNTER — Other Ambulatory Visit: Payer: Self-pay | Admitting: Nurse Practitioner

## 2024-12-07 DIAGNOSIS — Z1231 Encounter for screening mammogram for malignant neoplasm of breast: Secondary | ICD-10-CM

## 2024-12-07 LAB — HEPATITIS B SURFACE ANTIBODY,QUALITATIVE: Hep B Surface Ab, Qual: NONREACTIVE

## 2024-12-07 LAB — HEMOGLOBIN A1C
Est. average glucose Bld gHb Est-mCnc: 146 mg/dL
Hgb A1c MFr Bld: 6.7 % — ABNORMAL HIGH (ref 4.8–5.6)

## 2024-12-07 LAB — BMP8+EGFR
BUN/Creatinine Ratio: 11 (ref 9–23)
BUN: 12 mg/dL (ref 6–24)
CO2: 23 mmol/L (ref 20–29)
Calcium: 8.9 mg/dL (ref 8.7–10.2)
Chloride: 105 mmol/L (ref 96–106)
Creatinine, Ser: 1.08 mg/dL — ABNORMAL HIGH (ref 0.57–1.00)
Glucose: 82 mg/dL (ref 70–99)
Potassium: 3.9 mmol/L (ref 3.5–5.2)
Sodium: 143 mmol/L (ref 134–144)
eGFR: 61 mL/min/1.73 (ref >59)

## 2024-12-07 LAB — VITAMIN D 25 HYDROXY (VIT D DEFICIENCY, FRACTURES): Vit D, 25-Hydroxy: 44.3 ng/mL (ref 30.0–100.0)

## 2024-12-08 ENCOUNTER — Ambulatory Visit (INDEPENDENT_AMBULATORY_CARE_PROVIDER_SITE_OTHER)

## 2024-12-08 DIAGNOSIS — Z1231 Encounter for screening mammogram for malignant neoplasm of breast: Secondary | ICD-10-CM

## 2024-12-08 NOTE — Progress Notes (Signed)
 Remote ICD Transmission

## 2024-12-12 NOTE — Assessment & Plan Note (Addendum)
 Continue F/U Rheum.

## 2024-12-12 NOTE — Assessment & Plan Note (Signed)
 Increased insulin  needs due to prednisone . A1c likely elevated. - Checked A1c level.

## 2024-12-12 NOTE — Assessment & Plan Note (Addendum)
 Blood pressure is elevated, she is advised to take her medications as directed. She is to continue f/u with Cardiology for her HF

## 2024-12-12 NOTE — Assessment & Plan Note (Addendum)
 Will check vitamin D  level and supplement as needed.    Also encouraged to spend 15 minutes in the sun daily.

## 2024-12-14 NOTE — Telephone Encounter (Signed)
 Dr. McGuirt, they are hoping to start her on Dupixent, eosonophilic sinusitis and nasal polyposis, chronic asthma  Dr. Dewain would like provider thoughts aobut this?  Number in note is his cell phone number  I am happy to contact him for you.

## 2024-12-15 ENCOUNTER — Other Ambulatory Visit: Payer: Self-pay | Admitting: Nurse Practitioner

## 2024-12-27 ENCOUNTER — Other Ambulatory Visit: Payer: Self-pay

## 2024-12-27 NOTE — Patient Outreach (Signed)
 Complex Care Management   Visit Note  12/27/2024  Name:  Christine Mcdowell MRN: [id number] DOB: 02-22-1971  Situation: Referral received for Complex Care Management related to Heart Failure, Diabetes with Complications, and Hypertensive Heart Disease with chronic Systolic Heart Failure, Chronic pain secondary to RA/Lupus, Chronic Sinusitis. I obtained verbal consent from Patient.  Visit completed with Patient on the phone.  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   Discoid lupus    Fibromyalgia    History of blood transfusion several   related to anemia; had some w/hysterectomy also   Hx of cardiovascular stress test    ETT-Myoview (9/15):  No ischemia, EF 52%; NORMAL   Hx of echocardiogram    Echo (9/15):  EF 50-55%, ant HK, Gr 1 DD, mild MR, mild LAE, no effusion   Hypertension    Iron deficiency anemia    h/o iron transfusions   Lupus (systemic lupus erythematosus) (HCC)    Migraine    a few/year (07/03/2016)   Pneumonia 12/2015   RA (rheumatoid arthritis) (HCC)    all over (07/03/2016)   Sickle cell trait    Sleep apnea    Stroke (HCC) 2014 X 1; 2015 X 2; 2016 X 1;    right side of face more relaxed than the other; rare speech hesitation (07/03/2016)   Vaginal Pap smear, abnormal    ASCUS; HPV    Assessment: Patient Reported Symptoms:  Cognitive Cognitive Status: Alert and oriented to person, place, and time, Normal speech and language skills Cognitive/Intellectual Conditions Management [RPT]: None reported or documented in medical history or problem  list      Neurological Neurological Review of Symptoms: Headaches Neurological Management Strategies: Medication therapy, Routine screening, Adequate rest Neurological Self-Management Outcome: 3 (uncertain)  HEENT HEENT Symptoms Reported: Nasal discharge HEENT Management Strategies: Routine screening, Medication therapy HEENT Self-Management Outcome: 3 (uncertain)    Cardiovascular Cardiovascular Symptoms Reported: No symptoms reported Does patient have uncontrolled Hypertension?: No Cardiovascular Management Strategies: Medication therapy, Routine screening, Adequate rest Cardiovascular Self-Management Outcome: 4 (good)  Respiratory Respiratory Symptoms Reported: No symptoms reported    Endocrine Endocrine Symptoms Reported: Not assessed    Gastrointestinal Gastrointestinal Symptoms Reported: Not assessed      Genitourinary Genitourinary Symptoms Reported: Not assessed    Integumentary Integumentary Symptoms Reported: Not assessed    Musculoskeletal Musculoskelatal Symptoms Reviewed: Not assessed        Psychosocial Psychosocial Symptoms Reported: Not assessed   Major Change/Loss/Stressor/Fears (CP): Medical condition, self Techniques to Cope with Loss/Stress/Change: Diversional activities, Spiritual practice(s) Quality of Family Relationships: helpful, involved, supportive Do you feel physically threatened by others?: No    12/27/2024    PHQ2-9 Depression Screening   Christine Mcdowell interest or pleasure in doing things    Feeling down, depressed, or hopeless    PHQ-2 - Total Score    Trouble falling or staying asleep, or sleeping too much    Feeling tired or having Christine Mcdowell    Poor appetite or overeating  Feeling bad about yourself - or that you are a failure or have let yourself or your family down    Trouble concentrating on things, such as reading the newspaper or watching television    Moving or speaking so slowly that other people could have noticed.  Or the  opposite - being so fidgety or restless that you have been moving around a lot more than usual    Thoughts that you would be better off dead, or hurting yourself in some way    PHQ2-9 Total Score    If you checked off any problems, how difficult have these problems made it for you to do your work, take care of things at home, or get along with other people    Depression Interventions/Treatment      There were no vitals filed for this visit. Pain Scale: 0-10 Pain Score: 10-Worst pain ever Pain Type: Chronic pain Pain Location: Head Pain Descriptors / Indicators: Pounding Pain Onset: On-going Pain Intervention(s): Medication (See eMAR), Rest  Medications Reviewed Today     Reviewed by Christine Clayborne CROME, RN (Registered Nurse) on 12/27/24 at 1343  Med List Status: <None>   Medication Order Taking? Sig Documenting Provider Last Dose Status Informant  ACCU-CHEK GUIDE TEST test strip [id number]  USE AS DIRECTED Christine Mcdowell  Active   Accu-Chek Softclix Lancets lancets [id number]  USE AS DIRECTED Christine Mcdowell  Active   albuterol  (VENTOLIN  HFA) 108 (90 Base) MCG/ACT inhaler [id number]  Inhale 2 puffs into the lungs every 6 (six) hours as needed for wheezing or shortness of breath. Christine Domino, Mcdowell  Active   amoxicillin -clavulanate (AUGMENTIN ) 875-125 MG tablet [id number]  Take 1 tablet by mouth 2 (two) times daily. [provider]  Active   aspirin  81 MG chewable tablet [id number]  Chew 162 mg by mouth daily. Taking 2 tablets [provider]  Active Self  atorvastatin  (LIPITOR) 40 MG tablet [id number]  Take 1 tablet (40 mg total) by mouth daily. Christine Mcdowell  Active   Blood Glucose Monitoring Suppl (ACCU-CHEK GUIDE ME) w/Device Christine Mcdowell [id number]  USE AS DIRECTED TO CHECK BLOOD  SUGAR Christine Mcdowell  Active   budesonide  (PULMICORT ) 0.5 MG/2ML nebulizer solution [id number]  Take 0.5 mg by nebulization 2 (two) times daily. Patient is mixing with water and using as a nasal  rinse per Dr. Alla ENT [provider]  Active   carvedilol  (COREG ) 12.5 MG tablet [id number]  Take 1 tablet (12.5 mg total) by mouth 2 (two) times daily with a meal. Christine Manuelita Garre, PA-C  Active   Continuous Glucose Sensor (DEXCOM G7 SENSOR) MISC [id number]  USE TO CHECK BLOOD GLUCOSE AS  DIRECTED 3 TIMES DAILY AND  CHANGE SENSOR EVERY 10 DAYS Christine Mcdowell  Active   diclofenac Sodium (VOLTAREN) 1 % GEL [id number]  apply A SMALL AMOUNT TO involved joints UP TO TWICE DAILY [provider]  Active   DULoxetine (CYMBALTA) 30 MG capsule [id number]  Take 60 mg by mouth 2 (two) times daily. [provider]  Active   Fezolinetant  (VEOZAH ) 45 MG TABS [id number]  TAKE 1 TABLET BY MOUTH DAILY Moore, Janece, Mcdowell  Active   Fluticasone  Propionate (XHANCE ) 93 MCG/ACT EXHU [id number]  Place 1 spray into the nose in the morning and at bedtime. Christine Mcdowell  Active   folic acid  (FOLVITE ) 1 MG tablet [id number]  TAKE 1 TABLET BY MOUTH EVERY MORNING Bensimhon, Toribio SAUNDERS, MD  Active Self  Glucagon  (GVOKE  HYPOPEN 2-PACK) 1 MG/0.2ML SOAJ [id number]  Inject 1 mg into the skin as needed. Christine Mcdowell  Active   hydroxychloroquine  (PLAQUENIL ) 200 MG tablet [id number]  Take 1 tablet (200 mg total) by mouth 2 (two) times daily. Bensimhon, Toribio SAUNDERS, MD  Active   insulin  lispro (HUMALOG  KWIKPEN) 100 UNIT/ML KwikPen [id number]  INJECT SUBCUTANEOUSLY ACCORDING  TO SLIDING SCALE MAXIMUM DAILY  DOSE: 15 UNITS Christine Mcdowell  Active   Insulin  Pen Needle (NOVOFINE PLUS PEN NEEDLE) 32G X 4 MM MISC [id number]  Use with insulin  pens dx code e11.65 Christine Mcdowell  Active   Ipratropium-Albuterol  (COMBIVENT  RESPIMAT) 20-100 MCG/ACT AERS respimat [id number]  Inhale 1 puff into the lungs every 6 (six) hours. Christine Mcdowell  Active   ipratropium-albuterol  (DUONEB) 0.5-2.5 (3) MG/3ML SOLN [id number]  USE 1 VIAL VIA NEBULIZER EVERY 6 HOURS AS NEEDED Christine Mcdowell  Active   Magnesium  400 MG  TABS [id number]  Take 1 tablet by mouth daily. Christine Mcdowell  Active   methocarbamol (ROBAXIN) 500 MG tablet [id number]  Take 500 mg by mouth 4 (four) times daily. [provider]  Active   Methotrexate , PF, 20 MG/0.4ML SOAJ [id number]  Inject 20 mg into the skin once a week. Patient taking on Friday's [provider]  Active   metolazone  (ZAROXOLYN ) 2.5 MG tablet [id number]  TAKE 1 TABLET BY MOUTH AS NEEDED FOR WEIGHT GAIN OF 5 POUNDS WITHIN 3 DAYS AS DIRECTED needs appt for further refills Bensimhon, Daniel R, MD  Active   Multiple Vitamin (MULTIVITAMIN WITH MINERALS) TABS [id number]  Take 1 tablet by mouth every morning.  [provider]  Active Self  ondansetron  (ZOFRAN -ODT) 4 MG disintegrating tablet [id number]  4mg  ODT q4 hours prn nausea/vomit Zammit, Joseph, MD  Active   Phenylephrine -APAP-guaiFENesin  5-325-200 MG TABS [id number]  Take by mouth 2 (two) times daily. Per Dr. Alla ENT [provider]  Active   potassium chloride  (KLOR-CON  M) 10 MEQ tablet [id number]  TAKE 3 TABLETS BY MOUTH TWICE  DAILY Moore, Janece, Mcdowell  Active   predniSONE  (DELTASONE ) 5 MG tablet [id number]  Take 5 mg by mouth daily. [provider]  Active   PROAIR  HFA 108 (90 Base) MCG/ACT inhaler [id number]  INHALE 2 PUFFS BY MOUTH EVERY DAY AS NEEDED FOR SHORTNESS OF BREATH Moore, Janece, Mcdowell  Active   sacubitril -valsartan  (ENTRESTO ) 24-26 MG [id number]  Take 1 tablet by mouth 2 (two) times daily. Please cancel all previous orders for current medication. Change in dosage or pill size. Bensimhon, Toribio SAUNDERS, MD  Active   spironolactone  (ALDACTONE ) 25 MG tablet [id number]  TAKE 1 TABLET BY MOUTH IN THE  MORNING AND 1 TABLET BY MOUTH IN THE EVENING Bensimhon, Toribio SAUNDERS, MD  Active   Tocilizumab  (ACTEMRA  ACTPEN) 162 MG/0.9ML SOAJ [id number]  Inject 162 mg into the skin once a week. Patient is taking weekly on Fridays for Lupus. [provider]  Active   traMADol  (ULTRAM ) 50 MG tablet  [id number]  Take 50 mg by mouth every 6 (six) hours as needed for moderate pain (pain score 4-6). [provider]  Active   TRESIBA  FLEXTOUCH 100 UNIT/ML FlexTouch Pen [id number]  INJECT SUBCUTANEOUSLY INTO SKIN  10 UNITS DAILY Moore, Janece, Mcdowell  Active   Vitamin D , Ergocalciferol , (DRISDOL ) 1.25 MG (50000 UNIT) CAPS capsule [id number] No Take 1 capsule (50,000 Units total) by mouth 2 (two) times a week. Christine Mcdowell Taking Active  Recommendation:   Specialty provider follow-up with Dr. Elsie Pauling, ENT as directed   Follow Up Plan:   Telephone follow up appointment date/time:     01/21/2025 Status: Sch   Time: 3:30 PM Length: 30  Visit Type: VBCI TELEPHONE CALL 30 [2502] Copay: $0.00  Provider: Morgan Clayborne CROME, RN Department: [facility]   Clayborne Morgan RN BSN CCM [place]  [facility], [facility] Health Nurse Care Coordinator  Direct Dial : [phone] Website: [email or web address]

## 2024-12-27 NOTE — Patient Instructions (Signed)
 Visit Information  Thank you for taking time to visit with me today. Please don't hesitate to contact me if I can be of assistance to you before our next scheduled appointment.  Your next care management appointment is by telephone on Friday, January 30 at 3:30 PM  Please call the care guide team at [phone] if you need to cancel, schedule, or reschedule an appointment.   Please call [phone]-TALK (toll free, 24 hour hotline) if you are experiencing a Mental Health or Behavioral Health Crisis or need someone to talk to.  Clayborne Ly RN BSN CCM [place]  [facility], [facility] Health Nurse Care Coordinator  Direct Dial : [phone] Website: [email or web address]

## 2025-01-11 ENCOUNTER — Other Ambulatory Visit: Payer: Self-pay | Admitting: Nurse Practitioner

## 2025-01-11 DIAGNOSIS — N951 Menopausal and female climacteric states: Secondary | ICD-10-CM

## 2025-01-21 ENCOUNTER — Other Ambulatory Visit: Payer: Self-pay

## 2025-01-21 VITALS — BP 138/74

## 2025-01-21 DIAGNOSIS — E1142 Type 2 diabetes mellitus with diabetic polyneuropathy: Secondary | ICD-10-CM

## 2025-01-21 DIAGNOSIS — J329 Chronic sinusitis, unspecified: Secondary | ICD-10-CM

## 2025-01-21 DIAGNOSIS — M329 Systemic lupus erythematosus, unspecified: Secondary | ICD-10-CM

## 2025-01-21 DIAGNOSIS — R232 Flushing: Secondary | ICD-10-CM

## 2025-01-21 NOTE — Patient Outreach (Signed)
 Complex Care Management   Visit Note  01/21/2025  Name:  Christine Mcdowell MRN: [id number] DOB: [birth year]  Situation: Referral received for Complex Care Management related to Heart Failure, Diabetes with Complications, and Hypertensive Heart Disease with chronic Systolic Heart Failure, Chronic pain secondary to RA/Lupus, Chronic Sinusitis. I obtained verbal consent from Patient. Visit completed with Patient on the phone.  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   Discoid lupus    Fibromyalgia    History of blood transfusion several   related to anemia; had some w/hysterectomy also   Hx of cardiovascular stress test    ETT-Myoview (9/15):  No ischemia, EF 52%; NORMAL   Hx of echocardiogram    Echo (9/15):  EF 50-55%, ant HK, Gr 1 DD, mild MR, mild LAE, no effusion   Hypertension    Iron deficiency anemia    h/o iron transfusions   Lupus (systemic lupus erythematosus) (HCC)    Migraine    a few/year (07/03/2016)   Pneumonia 12/2015   RA (rheumatoid arthritis) (HCC)    all over (07/03/2016)   Sickle cell trait    Sleep apnea    Stroke (HCC) 2014 X 1; 2015 X 2; 2016 X 1;    right side of face more relaxed than the other; rare speech hesitation (07/03/2016)   Vaginal Pap smear, abnormal    ASCUS; HPV    Assessment: Patient Reported Symptoms:  Cognitive Cognitive Status: Alert and oriented to person, place, and time, Normal speech and language skills Cognitive/Intellectual Conditions Management [RPT]: None reported or documented in medical history or problem  list   Health Maintenance Behaviors: Annual physical exam, Healthy diet Health Facilitated by: Healthy diet, Rest  Neurological Neurological Review of Symptoms: Headaches, Numbness [place] Neurological Symptoms/Conditions [RPT]: right Sciatica pain Neurological Management Strategies: Medication therapy, Routine screening, Adequate rest Neurological Self-Management Outcome: 3 (uncertain)  HEENT HEENT Symptoms Reported: Nasal discharge, Other: (facial pressure, sinus congestion and headaches) HEENT Management Strategies: Medication therapy, Routine screening HEENT Self-Management Outcome: 3 (uncertain) Pain (headache)  Cardiovascular Cardiovascular Symptoms Reported: No symptoms reported Does patient have uncontrolled Hypertension?: No Is patient checking Blood Pressure at home?: Yes Cardiovascular Management Strategies: Adequate rest, Medication therapy, Routine screening Cardiovascular Self-Management Outcome: 4 (good)  Respiratory Respiratory Symptoms Reported: Other: Other Respiratory Symptoms: Altered Sleep Pattern Respiratory Management Strategies: Adequate rest, Routine screening Respiratory Self-Management Outcome: 3 (uncertain)  Endocrine Endocrine Symptoms Reported: No symptoms reported Is patient diabetic?: Yes Is patient checking blood sugars at home?: Yes List most recent blood sugar readings, include date and time of day: BS not provided today    Gastrointestinal Gastrointestinal Symptoms Reported: Not assessed      Genitourinary Genitourinary Symptoms Reported: Not assessed    Integumentary Integumentary Symptoms Reported: Other Other Integumentary Symptoms: hot flashes secondary to Menopause Skin Management Strategies: Medication therapy, Routine screening Skin Self-Management Outcome: 2 (bad)  Musculoskeletal Musculoskelatal Symptoms Reviewed: Back pain, Joint pain, Difficulty walking, Unsteady gait, Muscle pain, Limited mobility Musculoskeletal Management Strategies:  Adequate rest,  Medication therapy Musculoskeletal Self-Management Outcome: 4 (good) Falls in the past year?: No Number of falls in past year: 1 or less Was there an injury with Fall?: No Fall Risk Category Calculator: 0 Patient Fall Risk Level: Low Fall Risk Patient at Risk for Falls Due to: Impaired balance/gait, Impaired mobility, Orthopedic patient Fall risk Follow up: Falls evaluation completed, Falls prevention discussed  Psychosocial Psychosocial Symptoms Reported: Alteration in sleep habits Behavioral Management Strategies: Adequate rest, Medication therapy Behavioral Health Self-Management Outcome: 2 (bad) Major Change/Loss/Stressor/Fears (CP): Medical condition, self Techniques to Cope with Loss/Stress/Change: Diversional activities, Spiritual practice(s) Quality of Family Relationships: helpful, involved, supportive Do you feel physically threatened by others?: No    01/21/2025    PHQ2-9 Depression Screening   Christine Mcdowell interest or pleasure in doing things    Feeling down, depressed, or hopeless    PHQ-2 - Total Score    Trouble falling or staying asleep, or sleeping too much    Feeling tired or having Christine Mcdowell energy    Poor appetite or overeating     Feeling bad about yourself - or that you are a failure or have let yourself or your family down    Trouble concentrating on things, such as reading the newspaper or watching television    Moving or speaking so slowly that other people could have noticed.  Or the opposite - being so fidgety or restless that you have been moving around a lot more than usual    Thoughts that you would be better off dead, or hurting yourself in some way    PHQ2-9 Total Score    If you checked off any problems, how difficult have these problems made it for you to do your work, take care of things at home, or get along with other people    Depression Interventions/Treatment      Today's Vitals   01/21/25 1605  BP: 138/74   Pain Scale: Not given for  pain  Medications Reviewed Today     Reviewed by Morgan Clayborne CROME, RN (Registered Nurse) on 01/21/25 at 1509  Med List Status: <None>   Medication Order Taking? Sig Documenting Provider Last Dose Status Informant  ACCU-CHEK GUIDE TEST test strip [id number]  USE AS DIRECTED Georgina Speaks, FNP  Active   Accu-Chek Softclix Lancets lancets [id number]  USE AS DIRECTED Georgina Speaks, FNP  Active   albuterol  (VENTOLIN  HFA) 108 (90 Base) MCG/ACT inhaler [id number]  Inhale 2 puffs into the lungs every 6 (six) hours as needed for wheezing or shortness of breath. Kennyth Domino, FNP  Active   amoxicillin -clavulanate (AUGMENTIN ) 875-125 MG tablet [id number]  Take 1 tablet by mouth 2 (two) times daily. [provider]  Active   aspirin  81 MG chewable tablet [id number]  Chew 162 mg by mouth daily. Taking 2 tablets [provider]  Active Self  atorvastatin  (LIPITOR) 40 MG tablet [id number]  Take 1 tablet (40 mg total) by mouth daily. Georgina Speaks, FNP  Active   Blood Glucose Monitoring Suppl (ACCU-CHEK GUIDE ME) w/Device PRESSLEY [id number]  USE AS DIRECTED TO CHECK BLOOD  SUGAR Georgina Speaks, FNP  Active   budesonide  (PULMICORT ) 0.5 MG/2ML nebulizer solution [id number]  Take 0.5 mg by nebulization 2 (two) times daily. Patient is mixing with water and using as a nasal rinse per Dr. Alla ENT [provider]  Active   carvedilol  (COREG ) 12.5 MG tablet [id number]  Take 1 tablet (12.5 mg total) by mouth 2 (two) times daily with a meal. Colletta Shaver  Nat, PA-C  Active   Continuous Glucose Sensor (DEXCOM G7 SENSOR) MISC [id number]  USE TO CHECK BLOOD GLUCOSE AS  DIRECTED 3 TIMES DAILY AND  CHANGE SENSOR EVERY 10 DAYS Georgina Speaks, FNP  Active   diclofenac Sodium (VOLTAREN) 1 % GEL [id number]  apply A SMALL AMOUNT TO involved joints UP TO TWICE DAILY [provider]  Active   DULoxetine (CYMBALTA) 30 MG capsule [id number]  Take 60 mg by mouth 2 (two) times daily. [provider]   Active   Fluticasone  Propionate (XHANCE ) 93 MCG/ACT EXHU [id number]  Place 1 spray into the nose in the morning and at bedtime. Georgina Speaks, FNP  Active   folic acid  (FOLVITE ) 1 MG tablet [id number]  TAKE 1 TABLET BY MOUTH EVERY MORNING Bensimhon, Toribio SAUNDERS, MD  Active Self  Glucagon  (GVOKE HYPOPEN  2-PACK) 1 MG/0.2ML SOAJ [id number]  Inject 1 mg into the skin as needed. Georgina Speaks, FNP  Active   hydroxychloroquine  (PLAQUENIL ) 200 MG tablet [id number]  Take 1 tablet (200 mg total) by mouth 2 (two) times daily. Bensimhon, Toribio SAUNDERS, MD  Active   insulin  lispro (HUMALOG  KWIKPEN) 100 UNIT/ML KwikPen [id number]  INJECT SUBCUTANEOUSLY ACCORDING  TO SLIDING SCALE MAXIMUM DAILY  DOSE: 15 UNITS Georgina Speaks, FNP  Active   Insulin  Pen Needle (NOVOFINE PLUS PEN NEEDLE) 32G X 4 MM MISC [id number]  Use with insulin  pens dx code e11.65 Georgina Speaks, FNP  Active   Ipratropium-Albuterol  (COMBIVENT  RESPIMAT) 20-100 MCG/ACT AERS respimat [id number]  Inhale 1 puff into the lungs every 6 (six) hours. Georgina Speaks, FNP  Active   ipratropium-albuterol  (DUONEB) 0.5-2.5 (3) MG/3ML SOLN [id number]  USE 1 VIAL VIA NEBULIZER EVERY 6 HOURS AS NEEDED Georgina Speaks, FNP  Active   Magnesium  400 MG TABS [id number]  Take 1 tablet by mouth daily. Georgina Speaks, FNP  Active   methocarbamol (ROBAXIN) 500 MG tablet [id number]  Take 500 mg by mouth 4 (four) times daily. [provider]  Active   Methotrexate , PF, 20 MG/0.4ML SOAJ [id number]  Inject 20 mg into the skin once a week. Patient taking on Friday's [provider]  Active   metolazone  (ZAROXOLYN ) 2.5 MG tablet [id number]  TAKE 1 TABLET BY MOUTH AS NEEDED FOR WEIGHT GAIN OF 5 POUNDS WITHIN 3 DAYS AS DIRECTED needs appt for further refills Bensimhon, Daniel R, MD  Active   Multiple Vitamin (MULTIVITAMIN WITH MINERALS) TABS [id number]  Take 1 tablet by mouth every morning.  [provider]  Active Self  ondansetron  (ZOFRAN -ODT) 4 MG disintegrating tablet [id number]   4mg  ODT q4 hours prn nausea/vomit Zammit, Joseph, MD  Active   Phenylephrine -APAP-guaiFENesin  5-325-200 MG TABS [id number]  Take by mouth 2 (two) times daily. Per Dr. Alla ENT [provider]  Active   potassium chloride  (KLOR-CON  M) 10 MEQ tablet [id number]  TAKE 3 TABLETS BY MOUTH TWICE  DAILY Moore, Janece, FNP  Active   predniSONE  (DELTASONE ) 5 MG tablet [id number]  Take 5 mg by mouth daily. [provider]  Active   PROAIR  HFA 108 (90 Base) MCG/ACT inhaler [id number]  INHALE 2 PUFFS BY MOUTH EVERY DAY AS NEEDED FOR SHORTNESS OF SHERIDA Georgina Speaks, FNP  Active   sacubitril -valsartan  (ENTRESTO ) 24-26 MG [id number]  Take 1 tablet by mouth 2 (two) times daily. Please cancel all previous orders for current medication. Change in dosage or pill size. Bensimhon, Toribio SAUNDERS, MD  Active   spironolactone  (ALDACTONE ) 25 MG tablet [id number]  TAKE 1 TABLET BY MOUTH IN THE  MORNING AND 1 TABLET BY MOUTH IN THE EVENING Bensimhon, Toribio SAUNDERS, MD  Active   Tocilizumab  (ACTEMRA  ACTPEN) 162 MG/0.9ML SOAJ [id number]  Inject 162 mg into the skin once a week. Patient is taking weekly on Fridays for Lupus. [provider]  Active   traMADol  (ULTRAM ) 50 MG tablet [id number]  Take 50 mg by mouth every 6 (six) hours as needed for moderate pain (pain score 4-6). [provider]  Active   TRESIBA  FLEXTOUCH 100 UNIT/ML FlexTouch Pen [id number]  INJECT SUBCUTANEOUSLY INTO SKIN  10 UNITS DAILY Moore, Janece, FNP  Active   VEOZAH  45 MG TABS [id number]  TAKE 1 TABLET BY MOUTH DAILY Georgina Speaks, FNP  Active   Vitamin D , Ergocalciferol , (DRISDOL ) 1.25 MG (50000 UNIT) CAPS capsule [id number] No Take 1 capsule (50,000 Units total) by mouth 2 (two) times a week. Georgina Speaks, FNP Taking Active             Recommendation:   Specialty provider follow-up with [facility] as directed for evaluation of your sinusitis   Follow Up Plan:   Telephone follow up appointment date/time  02/14/2025 Status: Sch    Time: 1:30 PM Length: 30  Visit Type: VBCI TELEPHONE CALL 30 [2502] Copay: $0.00  Provider: Morgan Clayborne CROME, RN Department: [facility] HEALTH   Pharmacy Referral to assist with cost of [place]   Clayborne Morgan RN BSN CCM [place]  [facility], [facility] Health Nurse Care Coordinator  Direct Dial : [phone] Website: [email or web address]

## 2025-01-21 NOTE — Patient Instructions (Signed)
 Visit Information  Thank you for taking time to visit with me today. Please don't hesitate to contact me if I can be of assistance to you before our next scheduled appointment.  Your next care management appointment is by telephone on Monday, February 23 at 1:30 PM  Please call the care guide team at [phone] if you need to cancel, schedule, or reschedule an appointment.   Please call 1-800-273-TALK (toll free, 24 hour hotline) if you are experiencing a Mental Health or Behavioral Health Crisis or need someone to talk to.  Clayborne Ly RN BSN CCM [place]  [facility], [facility] Health Nurse Care Coordinator  Direct Dial : [phone] Website: [email or web address]

## 2025-01-24 ENCOUNTER — Other Ambulatory Visit (HOSPITAL_COMMUNITY): Payer: Self-pay

## 2025-01-24 ENCOUNTER — Telehealth: Payer: Self-pay | Admitting: Pharmacist

## 2025-01-24 DIAGNOSIS — Z79899 Other long term (current) drug therapy: Secondary | ICD-10-CM

## 2025-02-14 ENCOUNTER — Telehealth

## 2025-02-23 ENCOUNTER — Ambulatory Visit

## 2025-03-02 ENCOUNTER — Ambulatory Visit

## 2025-03-16 ENCOUNTER — Ambulatory Visit: Payer: 59

## 2025-03-16 ENCOUNTER — Ambulatory Visit: Payer: Self-pay

## 2025-03-30 ENCOUNTER — Encounter: Payer: Self-pay | Admitting: Nurse Practitioner

## 2025-05-25 ENCOUNTER — Ambulatory Visit

## 2025-06-01 ENCOUNTER — Ambulatory Visit

## 2025-08-24 ENCOUNTER — Ambulatory Visit

## 2025-08-31 ENCOUNTER — Ambulatory Visit
